# Patient Record
Sex: Male | Born: 1955 | Race: Black or African American | Hispanic: No | Marital: Single | Smoking: Current every day smoker
Health system: Southern US, Community
[De-identification: ages and names within clinical notes are randomized; demographics above are authoritative.]

## PROBLEM LIST (undated history)

## (undated) DIAGNOSIS — K219 Gastro-esophageal reflux disease without esophagitis: Secondary | ICD-10-CM

## (undated) DIAGNOSIS — L03119 Cellulitis of unspecified part of limb: Secondary | ICD-10-CM

## (undated) DIAGNOSIS — E119 Type 2 diabetes mellitus without complications: Secondary | ICD-10-CM

## (undated) DIAGNOSIS — G8929 Other chronic pain: Secondary | ICD-10-CM

## (undated) DIAGNOSIS — M1990 Unspecified osteoarthritis, unspecified site: Secondary | ICD-10-CM

## (undated) DIAGNOSIS — L02419 Cutaneous abscess of limb, unspecified: Secondary | ICD-10-CM

## (undated) DIAGNOSIS — E7800 Pure hypercholesterolemia, unspecified: Secondary | ICD-10-CM

## (undated) DIAGNOSIS — F329 Major depressive disorder, single episode, unspecified: Secondary | ICD-10-CM

## (undated) DIAGNOSIS — F32A Depression, unspecified: Secondary | ICD-10-CM

## (undated) DIAGNOSIS — D649 Anemia, unspecified: Secondary | ICD-10-CM

## (undated) DIAGNOSIS — D563 Thalassemia minor: Secondary | ICD-10-CM

## (undated) DIAGNOSIS — M25559 Pain in unspecified hip: Secondary | ICD-10-CM

## (undated) DIAGNOSIS — G4730 Sleep apnea, unspecified: Secondary | ICD-10-CM

## (undated) DIAGNOSIS — J189 Pneumonia, unspecified organism: Secondary | ICD-10-CM

## (undated) DIAGNOSIS — I10 Essential (primary) hypertension: Secondary | ICD-10-CM

## (undated) HISTORY — PX: INGUINAL HERNIA REPAIR: SUR1180

## (undated) HISTORY — PX: TOENAIL EXCISION: SUR558

## (undated) HISTORY — PX: SHOULDER ARTHROSCOPY W/ ROTATOR CUFF REPAIR: SHX2400

---

## 2015-02-03 ENCOUNTER — Encounter (HOSPITAL_COMMUNITY): Payer: Self-pay | Admitting: *Deleted

## 2015-02-03 ENCOUNTER — Emergency Department (HOSPITAL_COMMUNITY)
Admission: EM | Admit: 2015-02-03 | Discharge: 2015-02-03 | Disposition: A | Discharge status: Home / Self Care | Payer: Medicare Other | Attending: Emergency Medicine | Admitting: Emergency Medicine

## 2015-02-03 DIAGNOSIS — Z8719 Personal history of other diseases of the digestive system: Secondary | ICD-10-CM | POA: Insufficient documentation

## 2015-02-03 DIAGNOSIS — Z8659 Personal history of other mental and behavioral disorders: Secondary | ICD-10-CM | POA: Insufficient documentation

## 2015-02-03 DIAGNOSIS — E119 Type 2 diabetes mellitus without complications: Secondary | ICD-10-CM | POA: Insufficient documentation

## 2015-02-03 DIAGNOSIS — I10 Essential (primary) hypertension: Secondary | ICD-10-CM | POA: Insufficient documentation

## 2015-02-03 DIAGNOSIS — Z720 Tobacco use: Secondary | ICD-10-CM | POA: Insufficient documentation

## 2015-02-03 DIAGNOSIS — M25552 Pain in left hip: Secondary | ICD-10-CM | POA: Insufficient documentation

## 2015-02-03 DIAGNOSIS — G8929 Other chronic pain: Secondary | ICD-10-CM

## 2015-02-03 HISTORY — DX: Essential (primary) hypertension: I10

## 2015-02-03 HISTORY — DX: Gastro-esophageal reflux disease without esophagitis: K21.9

## 2015-02-03 HISTORY — DX: Depression, unspecified: F32.A

## 2015-02-03 HISTORY — DX: Pain in unspecified hip: M25.559

## 2015-02-03 HISTORY — DX: Major depressive disorder, single episode, unspecified: F32.9

## 2015-02-03 HISTORY — DX: Other chronic pain: G89.29

## 2015-02-03 MED ORDER — HYDROMORPHONE HCL 2 MG/ML IJ SOLN
2.0000 mg | Freq: Once | INTRAMUSCULAR | Status: AC
  Administered 2015-02-03: 2 mg via INTRAMUSCULAR
  Filled 2015-02-03: qty 1

## 2015-02-03 NOTE — ED Provider Notes (Signed)
CSN: [id number]     Arrival date & time 02/03/15  1641 History   First MD Initiated Contact with Patient 02/03/15 1646     Chief Complaint  Patient presents with  . Hip Pain     (Consider location/radiation/quality/duration/timing/severity/associated sxs/prior Treatment) HPI Comments: Chronic hip and leg pain has worsened in the past few days despite taking his normal pain meds. He was put on meloxicam by a new PCP, but that is not helping. Has percocet and vicodin at home.  Patient is a 59 y.o. male presenting with hip pain.  Hip Pain This is a chronic problem. The current episode started more than 1 week ago. The problem occurs constantly. The problem has not changed since onset.Pertinent negatives include no abdominal pain and no shortness of breath. Nothing aggravates the symptoms. Nothing relieves the symptoms.    Past Medical History  Diagnosis Date  . Diabetes mellitus without complication   . Hypertension   . Chronic hip pain   . GERD (gastroesophageal reflux disease)   . Depression    History reviewed. No pertinent past surgical history. History reviewed. No pertinent family history. History  Substance Use Topics  . Smoking status: Current Every Day Smoker  . Smokeless tobacco: Not on file  . Alcohol Use: Not on file    Review of Systems  Constitutional: Negative for fever.  Respiratory: Negative for cough and shortness of breath.   Gastrointestinal: Negative for nausea, vomiting and abdominal pain.  All other systems reviewed and are negative.     Allergies  Sulfa antibiotics  Home Medications   Prior to Admission medications   Not on File   BP 138/67 mmHg  Pulse 77  Temp(Src) 98.5 F (36.9 C) (Oral)  Resp 20  SpO2 100% Physical Exam  Constitutional: He is oriented to person, place, and time. He appears well-developed and well-nourished. No distress.  HENT:  Head: Normocephalic and atraumatic.  Mouth/Throat: No oropharyngeal exudate.  Eyes: EOM  are normal. Pupils are equal, round, and reactive to light.  Neck: Normal range of motion. Neck supple.  Cardiovascular: Normal rate and regular rhythm.  Exam reveals no friction rub.   No murmur Rissler. Pulmonary/Chest: Effort normal and breath sounds normal. No respiratory distress. He has no wheezes. He has no rales.  Abdominal: Soft. He exhibits no distension. There is no tenderness. There is no rebound.  Musculoskeletal: Normal range of motion. He exhibits no edema.  R calf wrapped in a [employer].  Neurological: He is alert and oriented to person, place, and time.  Skin: He is not diaphoretic.  Nursing note and vitals reviewed.   ED Course  Procedures (including critical care time) Labs Review Labs Reviewed - No data to display  Imaging Review No results found.   EKG Interpretation   Date/Time:  Friday Feb 03 2015 20:32:45 EDT Ventricular Rate:  67 PR Interval:  154 QRS Duration: 81 QT Interval:  413 QTC Calculation: 436 R Axis:   75 Text Interpretation:  Sinus rhythm Atrial premature complex Anteroseptal  infarct, old Baseline wander in lead(s) V2 V3 V4 No prior for comparison  Confirmed by Mingo Amber  MD, Eaton Folmar ([id number]) on 02/03/2015 8:38:10 PM      MDM   Final diagnoses:  Chronic hip pain, left    59 year old male here with hip pain. Chronic. Worse over the past 2 days. Also having right calf pain where he has an ulcer that he is following wound care with. Vitals are stable here. He has  had no recent trauma or falls. He denies any systemic symptoms. Patient given IM Dilaudid for pain control. I informed him I cannot send him home with any medications because he is starting on these and he needs to follow-up with a chronic pain clinic. Patient began having some mild L chest pain, described as light stinging after ambulation. No DOE. No crushing chest pain. EKG ok. Another dose of dilaudid given. Stable for discharge, states chest pain didn't last very long. No concern for  ACS.   Evelina Bucy, MD 02/03/15 [id number]

## 2015-02-03 NOTE — ED Notes (Signed)
Pt stood and ambulated.

## 2015-02-03 NOTE — ED Notes (Signed)
Pt now complaining of chest pain when RN entered room to discharge him. MD notified, MD Mingo Amber went in to see patient and EKG is being obtained.

## 2015-02-03 NOTE — ED Notes (Signed)
Pt attempting ambulation with tech

## 2015-02-03 NOTE — ED Notes (Signed)
Pt in via EMS c/o left hip pain, states this has been going on for years but recently has been uncontrollable, pt also c/o pain to chronic wound to right lower leg but states this is not new and denies new symptoms related to wound. Pain worse with ambulation, no distress noted.

## 2015-02-03 NOTE — Discharge Instructions (Signed)
Chronic Pain Chronic pain can be defined as pain that is off and on and lasts for 3-6 months or longer. Many things cause chronic pain, which can make it difficult to make a diagnosis. There are many treatment options available for chronic pain. However, finding a treatment that works well for you may require trying various approaches until the right one is found. Many people benefit from a combination of two or more types of treatment to control their pain. SYMPTOMS  Chronic pain can occur anywhere in the body and can range from mild to very severe. Some types of chronic pain include:  Headache.  Low back pain.  Cancer pain.  Arthritis pain.  Neurogenic pain. This is pain resulting from damage to nerves. People with chronic pain may also have other symptoms such as:  Depression.  Anger.  Insomnia.  Anxiety. DIAGNOSIS  Your health care provider will help diagnose your condition over time. In many cases, the initial focus will be on excluding possible conditions that could be causing the pain. Depending on your symptoms, your health care provider may order tests to diagnose your condition. Some of these tests may include:   Blood tests.   CT scan.   MRI.   X-rays.   Ultrasounds.   Nerve conduction studies.  You may need to see a specialist.  TREATMENT  Finding treatment that works well may take time. You may be referred to a pain specialist. He or she may prescribe medicine or therapies, such as:   Mindful meditation or yoga.  Shots (injections) of numbing or pain-relieving medicines into the spine or area of pain.  Local electrical stimulation.  Acupuncture.   Massage therapy.   Aroma, color, light, or sound therapy.   Biofeedback.   Working with a physical therapist to keep from getting stiff.   Regular, gentle exercise.   Cognitive or behavioral therapy.   Group support.  Sometimes, surgery may be recommended.  HOME CARE INSTRUCTIONS    Take all medicines as directed by your health care provider.   Lessen stress in your life by relaxing and doing things such as listening to calming music.   Exercise or be active as directed by your health care provider.   Eat a healthy diet and include things such as vegetables, fruits, fish, and lean meats in your diet.   Keep all follow-up appointments with your health care provider.   Attend a support group with others suffering from chronic pain. SEEK MEDICAL CARE IF:   Your pain gets worse.   You develop a new pain that was not there before.   You cannot tolerate medicines given to you by your health care provider.   You have new symptoms since your last visit with your health care provider.  SEEK IMMEDIATE MEDICAL CARE IF:   You feel weak.   You have decreased sensation or numbness.   You lose control of bowel or bladder function.   Your pain suddenly gets much worse.   You develop shaking.  You develop chills.  You develop confusion.  You develop chest pain.  You develop shortness of breath.  MAKE SURE YOU:  Understand these instructions.  Will watch your condition.  Will get help right away if you are not doing well or get worse. Document Released: 06/01/2002 Document Revised: 05/12/2013 Document Reviewed: 03/05/2013 [facility] Patient Information 2015 [place], [place]. This information is not intended to replace advice given to you by your health care provider. Make sure you discuss any  questions you have with your health care provider.   Emergency Department Resource Guide 1) Find a Doctor and Pay Out of Pocket Although you won't have to find out who is covered by your insurance plan, it is a good idea to ask around and get recommendations. You will then need to call the office and see if the doctor you have chosen will accept you as a new patient and what types of options they offer for patients who are self-pay. Some doctors offer  discounts or will set up payment plans for their patients who do not have insurance, but you will need to ask so you aren't surprised when you get to your appointment.  2) Contact Your Local Health Department Not all health departments have doctors that can see patients for sick visits, but many do, so it is worth a call to see if yours does. If you don't know where your local health department is, you can check in your phone book. The CDC also has a tool to help you locate your state's health department, and many state websites also have listings of all of their local health departments.  3) Find a [facility] If your illness is not likely to be very severe or complicated, you may want to try a walk in clinic. These are popping up all over the country in pharmacies, drugstores, and shopping centers. They're usually staffed by nurse practitioners or physician assistants that have been trained to treat common illnesses and complaints. They're usually fairly quick and inexpensive. However, if you have serious medical issues or chronic medical problems, these are probably not your best option.  No Primary Care Doctor: - Call Health Connect at  [phone] - they can help you locate a primary care doctor that  accepts your insurance, provides certain services, etc. - Physician Referral Service- [phone]  Chronic Pain Problems: Organization         Address  Phone   Notes  [facility]  [phone] Patients need to be referred by their primary care doctor.   Medication Assistance: Organization         Address  Phone   Notes  [facility] Medication [facility] [place], [place] [address] [phone] --Must be a resident of [facility] -- Must have NO insurance coverage whatsoever (no Medicaid/ Medicare, etc.) -- The pt. MUST have a primary care doctor that directs their care regularly and follows them in the community   MedAssist   [phone]   [employer]  4435218216    Agencies that provide inexpensive medical care: Organization         Address  Phone   Notes  [place]  [phone]   [facility]    [phone]   [facility] [place], [place] [address] [phone]   [place] [address] [place] [phone]   Planned Parenthood    [phone]   [facility]    [phone]   [place] and [place] [address], [place] Phone:  [phone], Fax:  [phone] Hours of Operation:  9 am - 6 pm, M-F.  Also accepts Medicaid/Medicare and self-pay.  [facility] for [place], [address], [place] Phone: [phone], Fax: [phone]. Hours of Operation:  8:30 am - 5:30 pm, M-F.  Also accepts Medicaid and self-pay.  [facility] [place] [address] [place] Phone: [phone]   [place], [place], [place] [phone], Ext. [phone] Mondays & Thursdays: 7-9 AM.  First 15 patients are seen on a first come, first serve basis.    [place] Providers:  Organization         Address  Phone   Notes  [facility] [address], [place] [phone] Also accepts self-pay patients.  [facility] [address], [place]  [phone]   [place], [address], [place] [phone]   [facility] [address] [place] [phone]   Lucianne Lei [address], [place]   [phone] Only accepts [place] Access [place] patients after they have their name applied to their card.   Self-Pay (no insurance) in [facility]:  Organization         Address  Phone   Notes  Sickle Cell Patients, [facility] Internal Medicine [place] [phone]   [facility] Urgent Care [place] [phone]   [facility] [place]  [place], [place], [place] [phone]   [facility]/Dr. Osei-Bonsu  [address], [place] or [address], [place] [phone] Phone number for both [place] and [place] locations is the same.  Urgent Medical and [facility] [address] [place] [phone]   [facility] [address] [place] or [address] [phone]   [facility] [address] [place] [phone], phone; [phone], fax Sees patients 1st and 3rd Saturday of every month.  Must not qualify for public or private insurance (i.e. Medicaid, Medicare, [place] Health Choice, Veterans' Benefits)  Household income should be no more than 200% of the poverty level The clinic cannot treat you if you are pregnant or think you are pregnant  Sexually transmitted diseases are not treated at the clinic.    Dental Care: Organization         Address  Phone  Notes  [facility] [place] [phone] Accepts children up to age 106 who are enrolled in [place] or [place]; pregnant women with a Medicaid card; and children who have applied for Medicaid or [place] Health Choice, but were declined, whose parents can pay a reduced fee at time of service.  [facility] Department of [facility]  [address], [place] [phone] Accepts children up to age 42 who are enrolled in [place] or [place]; pregnant women with a Medicaid card; and children who have applied for Medicaid or [place] Health Choice, but were declined, whose parents can pay a reduced fee at time of service.  [place] Adult Dental Access PROGRAM  [place]  [phone] Patients are seen by appointment only. Walk-ins are not accepted. [place] will see patients 16 years of age and older. Monday - Tuesday (8am-5pm) Most Wednesdays (8:30-5pm) $30 per visit, cash only  [facility] Adult Dental Access PROGRAM  [address], [place] [phone] Patients are seen by appointment only. Walk-ins are not accepted. [place]  will see patients 10 years of age and older. One Wednesday Evening (Monthly: Volunteer Based).  $30 per visit, cash only  [place]  [phone] for adults; Children under age 90, call Graduate Pediatric Dentistry at [phone]. Children aged 22-14, please call [phone] to request a pediatric application.  Dental services are provided in all areas of dental care including fillings, crowns and bridges, complete and partial dentures, implants, gum treatment, root canals, and extractions. Preventive care is also provided. Treatment is provided to both adults and children. Patients are selected via a lottery and there is often a waiting list.   [facility] [address] [place]  [phone] [email or web address]   [facility] [address] [place], [place] [phone], Ext. [phone] Second and Fourth Thursday of each month, opens at 6:30 AM; Clinic ends at 9 AM.  Patients are seen on a first-come first-served basis, and a limited number are seen during each clinic.   [facility]  [address] [place], [place] [phone]   Eligibility Requirements You must have lived in [place], [place], or [place] counties for at least the last three months.   You cannot be eligible for state or federal sponsored [employer], including [employer], [place], or [employer].   You generally cannot be eligible for healthcare insurance through your employer.    How to apply: Eligibility screenings are held every Tuesday and Wednesday  afternoon from 1:00 pm until 4:00 pm. You do not need an appointment for the interview!  [facility] [address] [place], [place]   [place]  Admire Department  [place]  [phone]    Behavioral Health Resources in the Community: Intensive Outpatient Programs Organization         Address  Phone  Notes  [place]. [address], [place], [place] [phone]   [facility] Outpatient [address] [place], [place]   ADS: Alcohol & Drug Svcs [address] [place], [place]   [place] [address],  [place], [place] or [phone]   Substance Abuse Resources Organization         Address  Phone  Notes  Alcohol and Drug Services  [phone]   [place]  [phone]   The [place]   Chinita Pester  [phone]   Residential & Outpatient Substance Abuse Program  [phone]   Psychological Services Organization         Address  Phone  Notes  [facility] [place]  [place]  [phone]   [place] [address], [place] or [phone]    Mobile Crisis Teams Organization         Address  Phone  Notes  [facility], Mobile Crisis Care Unit  [phone]   [facility]  [address] [place], [place]   [place] Levels [address] [place] [phone]    Self-Help/Support Groups Organization         Address  Phone             Notes  [place]. of [place] - variety of support groups  [place] Call for more information  Narcotics Anonymous (NA), Caring Services [address],  [place]  2 meetings at this location   Residential Treatment  Programs Organization         Address  Phone  Notes  [facility] [address]    [place]  [phone]   [facility]  [address], [place] [address], [place], [place]   [place], [place] [phone] Admissions: 8am-3pm M-F  Incentives Substance [place] [address],    [place], [place] [phone]   [facility] [address] [place], [place], [place]   [facility] [address],  [place], [place]   [facility]ight Programs - Intensive Outpatient [address], [place], [place]   [facility] ([place] [place].,  [place], [place] [phone] or [phone]   [facility] Services (RTS) [address], [place], [place] Accepts Medicaid  [place] [address]  [place] [phone] Substance Abuse/Addiction Treatment   [facility] Organization         Address  Phone  Notes  [facility]  [phone]   Domenic Schwab, PhD [address] Arlis [place], [place]   [phone] or [phone]   [place], [place] [phone]   [facility] [address] [place], [place] [phone] Insurance/Medicaid/sponsorship through [facility] and Families [address], [address] [place]                                    [place], [place] [phone] [place] [address], [place] [phone]    Dr. Adele Schilder  [phone]   [facility] Dept. 1) [address], [place] 2) [place] 3)  [place] [address], [place] [phone]  [phone]   [place] [phone] or [phone] (After Hours)

## 2015-02-03 NOTE — ED Notes (Signed)
Bed: EC95 Expected date:  Expected time:  Means of arrival:  Comments: EMS-wound

## 2015-02-03 NOTE — Progress Notes (Signed)
EDCM spoke to patient at bedside.  Patient confirms her has [employer].  Patient confirms his pcp is Dr. Rondell Reams with the [place] in [place].  System updated.  No further EDCM needs at this time.

## 2015-02-03 NOTE — ED Notes (Signed)
Md at bedside

## 2015-03-14 ENCOUNTER — Emergency Department (HOSPITAL_COMMUNITY): Payer: Medicare Other

## 2015-03-14 ENCOUNTER — Encounter (HOSPITAL_COMMUNITY): Payer: Self-pay

## 2015-03-14 ENCOUNTER — Inpatient Hospital Stay (HOSPITAL_COMMUNITY)
Admission: EM | Admit: 2015-03-14 | Discharge: 2015-03-24 | DRG: 872 | Disposition: A | Discharge status: Skilled Nursing Facility | Payer: Medicare Other | Attending: Internal Medicine | Admitting: Internal Medicine

## 2015-03-14 DIAGNOSIS — G8929 Other chronic pain: Secondary | ICD-10-CM | POA: Diagnosis present

## 2015-03-14 DIAGNOSIS — A4101 Sepsis due to Methicillin susceptible Staphylococcus aureus: Principal | ICD-10-CM | POA: Diagnosis present

## 2015-03-14 DIAGNOSIS — L03115 Cellulitis of right lower limb: Secondary | ICD-10-CM | POA: Diagnosis present

## 2015-03-14 DIAGNOSIS — M60009 Infective myositis, unspecified site: Secondary | ICD-10-CM | POA: Diagnosis present

## 2015-03-14 DIAGNOSIS — D72829 Elevated white blood cell count, unspecified: Secondary | ICD-10-CM | POA: Diagnosis present

## 2015-03-14 DIAGNOSIS — R509 Fever, unspecified: Secondary | ICD-10-CM

## 2015-03-14 DIAGNOSIS — Z23 Encounter for immunization: Secondary | ICD-10-CM

## 2015-03-14 DIAGNOSIS — E1351 Other specified diabetes mellitus with diabetic peripheral angiopathy without gangrene: Secondary | ICD-10-CM | POA: Diagnosis not present

## 2015-03-14 DIAGNOSIS — L03119 Cellulitis of unspecified part of limb: Secondary | ICD-10-CM | POA: Diagnosis not present

## 2015-03-14 DIAGNOSIS — E1165 Type 2 diabetes mellitus with hyperglycemia: Secondary | ICD-10-CM | POA: Diagnosis present

## 2015-03-14 DIAGNOSIS — L97529 Non-pressure chronic ulcer of other part of left foot with unspecified severity: Secondary | ICD-10-CM | POA: Diagnosis not present

## 2015-03-14 DIAGNOSIS — Z833 Family history of diabetes mellitus: Secondary | ICD-10-CM

## 2015-03-14 DIAGNOSIS — E1151 Type 2 diabetes mellitus with diabetic peripheral angiopathy without gangrene: Secondary | ICD-10-CM | POA: Diagnosis not present

## 2015-03-14 DIAGNOSIS — M25559 Pain in unspecified hip: Secondary | ICD-10-CM | POA: Diagnosis not present

## 2015-03-14 DIAGNOSIS — R609 Edema, unspecified: Secondary | ICD-10-CM | POA: Diagnosis not present

## 2015-03-14 DIAGNOSIS — E1140 Type 2 diabetes mellitus with diabetic neuropathy, unspecified: Secondary | ICD-10-CM | POA: Diagnosis present

## 2015-03-14 DIAGNOSIS — B9561 Methicillin susceptible Staphylococcus aureus infection as the cause of diseases classified elsewhere: Secondary | ICD-10-CM | POA: Diagnosis not present

## 2015-03-14 DIAGNOSIS — E119 Type 2 diabetes mellitus without complications: Secondary | ICD-10-CM | POA: Diagnosis present

## 2015-03-14 DIAGNOSIS — Z79899 Other long term (current) drug therapy: Secondary | ICD-10-CM | POA: Diagnosis not present

## 2015-03-14 DIAGNOSIS — A419 Sepsis, unspecified organism: Secondary | ICD-10-CM | POA: Diagnosis not present

## 2015-03-14 DIAGNOSIS — Z794 Long term (current) use of insulin: Secondary | ICD-10-CM

## 2015-03-14 DIAGNOSIS — M60062 Infective myositis, left lower leg: Secondary | ICD-10-CM | POA: Diagnosis not present

## 2015-03-14 DIAGNOSIS — Z882 Allergy status to sulfonamides status: Secondary | ICD-10-CM | POA: Diagnosis not present

## 2015-03-14 DIAGNOSIS — A4901 Methicillin susceptible Staphylococcus aureus infection, unspecified site: Secondary | ICD-10-CM | POA: Diagnosis present

## 2015-03-14 DIAGNOSIS — K219 Gastro-esophageal reflux disease without esophagitis: Secondary | ICD-10-CM | POA: Diagnosis not present

## 2015-03-14 DIAGNOSIS — E1010 Type 1 diabetes mellitus with ketoacidosis without coma: Secondary | ICD-10-CM | POA: Diagnosis present

## 2015-03-14 DIAGNOSIS — I10 Essential (primary) hypertension: Secondary | ICD-10-CM | POA: Diagnosis present

## 2015-03-14 DIAGNOSIS — L97329 Non-pressure chronic ulcer of left ankle with unspecified severity: Secondary | ICD-10-CM | POA: Diagnosis not present

## 2015-03-14 DIAGNOSIS — D509 Iron deficiency anemia, unspecified: Secondary | ICD-10-CM | POA: Diagnosis not present

## 2015-03-14 DIAGNOSIS — L0390 Cellulitis, unspecified: Secondary | ICD-10-CM

## 2015-03-14 DIAGNOSIS — L03116 Cellulitis of left lower limb: Secondary | ICD-10-CM

## 2015-03-14 DIAGNOSIS — E1365 Other specified diabetes mellitus with hyperglycemia: Secondary | ICD-10-CM

## 2015-03-14 DIAGNOSIS — E785 Hyperlipidemia, unspecified: Secondary | ICD-10-CM | POA: Diagnosis present

## 2015-03-14 DIAGNOSIS — K5900 Constipation, unspecified: Secondary | ICD-10-CM | POA: Diagnosis not present

## 2015-03-14 DIAGNOSIS — F17210 Nicotine dependence, cigarettes, uncomplicated: Secondary | ICD-10-CM | POA: Diagnosis present

## 2015-03-14 DIAGNOSIS — Z7982 Long term (current) use of aspirin: Secondary | ICD-10-CM | POA: Diagnosis not present

## 2015-03-14 DIAGNOSIS — L97319 Non-pressure chronic ulcer of right ankle with unspecified severity: Secondary | ICD-10-CM | POA: Diagnosis present

## 2015-03-14 DIAGNOSIS — I83003 Varicose veins of unspecified lower extremity with ulcer of ankle: Secondary | ICD-10-CM | POA: Diagnosis not present

## 2015-03-14 DIAGNOSIS — F329 Major depressive disorder, single episode, unspecified: Secondary | ICD-10-CM | POA: Diagnosis present

## 2015-03-14 DIAGNOSIS — D563 Thalassemia minor: Secondary | ICD-10-CM | POA: Diagnosis not present

## 2015-03-14 DIAGNOSIS — N400 Enlarged prostate without lower urinary tract symptoms: Secondary | ICD-10-CM | POA: Diagnosis not present

## 2015-03-14 DIAGNOSIS — B9689 Other specified bacterial agents as the cause of diseases classified elsewhere: Secondary | ICD-10-CM | POA: Diagnosis not present

## 2015-03-14 DIAGNOSIS — L03032 Cellulitis of left toe: Secondary | ICD-10-CM | POA: Diagnosis not present

## 2015-03-14 LAB — GLUCOSE, CAPILLARY: Glucose-Capillary: 216 mg/dL — ABNORMAL HIGH (ref 65–99)

## 2015-03-14 LAB — URINALYSIS, ROUTINE W REFLEX MICROSCOPIC
Glucose, UA: 250 mg/dL — AB
HGB URINE DIPSTICK: NEGATIVE
Ketones, ur: NEGATIVE mg/dL
Leukocytes, UA: NEGATIVE
NITRITE: NEGATIVE
Protein, ur: 30 mg/dL — AB
Specific Gravity, Urine: 1.033 — ABNORMAL HIGH (ref 1.005–1.030)
UROBILINOGEN UA: 1 mg/dL (ref 0.0–1.0)
pH: 5.5 (ref 5.0–8.0)

## 2015-03-14 LAB — CBC WITH DIFFERENTIAL/PLATELET
BASOS PCT: 0 % (ref 0–1)
Basophils Absolute: 0 10*3/uL (ref 0.0–0.1)
Eosinophils Absolute: 0.1 10*3/uL (ref 0.0–0.7)
Eosinophils Relative: 1 % (ref 0–5)
HEMATOCRIT: 26.3 % — AB (ref 39.0–52.0)
HEMOGLOBIN: 8.2 g/dL — AB (ref 13.0–17.0)
LYMPHS ABS: 1.1 10*3/uL (ref 0.7–4.0)
Lymphocytes Relative: 14 % (ref 12–46)
MCH: 20.8 pg — AB (ref 26.0–34.0)
MCHC: 31.2 g/dL (ref 30.0–36.0)
MCV: 66.8 fL — AB (ref 78.0–100.0)
MONO ABS: 0.4 10*3/uL (ref 0.1–1.0)
Monocytes Relative: 5 % (ref 3–12)
NEUTROS ABS: 6.5 10*3/uL (ref 1.7–7.7)
Neutrophils Relative %: 80 % — ABNORMAL HIGH (ref 43–77)
Platelets: 239 10*3/uL (ref 150–400)
RBC: 3.94 MIL/uL — AB (ref 4.22–5.81)
RDW: 17.6 % — ABNORMAL HIGH (ref 11.5–15.5)
WBC: 8.1 10*3/uL (ref 4.0–10.5)

## 2015-03-14 LAB — CG4 I-STAT (LACTIC ACID): Lactic Acid, Venous: 1.41 mmol/L (ref 0.5–2.0)

## 2015-03-14 LAB — COMPREHENSIVE METABOLIC PANEL
ALK PHOS: 125 U/L (ref 38–126)
ALT: 59 U/L (ref 17–63)
AST: 38 U/L (ref 15–41)
Albumin: 3.4 g/dL — ABNORMAL LOW (ref 3.5–5.0)
Anion gap: 9 (ref 5–15)
BUN: 22 mg/dL — ABNORMAL HIGH (ref 6–20)
CO2: 23 mmol/L (ref 22–32)
Calcium: 8.7 mg/dL — ABNORMAL LOW (ref 8.9–10.3)
Chloride: 108 mmol/L (ref 101–111)
Creatinine, Ser: 1.12 mg/dL (ref 0.61–1.24)
GFR calc Af Amer: >60 mL/min (ref >60)
Glucose, Bld: 189 mg/dL — ABNORMAL HIGH (ref 65–99)
POTASSIUM: 4.4 mmol/L (ref 3.5–5.1)
SODIUM: 140 mmol/L (ref 135–145)
Total Bilirubin: 0.4 mg/dL (ref 0.3–1.2)
Total Protein: 7.5 g/dL (ref 6.5–8.1)

## 2015-03-14 LAB — CBG MONITORING, ED
GLUCOSE-CAPILLARY: 202 mg/dL — AB (ref 65–99)
Glucose-Capillary: 192 mg/dL — ABNORMAL HIGH (ref 65–99)

## 2015-03-14 LAB — I-STAT CG4 LACTIC ACID, ED: Lactic Acid, Venous: 2.01 mmol/L (ref 0.5–2.0)

## 2015-03-14 LAB — URINE MICROSCOPIC-ADD ON

## 2015-03-14 MED ORDER — SODIUM CHLORIDE 0.9 % IV BOLUS (SEPSIS)
1000.0000 mL | Freq: Once | INTRAVENOUS | Status: AC
  Administered 2015-03-14: 1000 mL via INTRAVENOUS

## 2015-03-14 MED ORDER — MORPHINE SULFATE 4 MG/ML IJ SOLN
4.0000 mg | Freq: Once | INTRAMUSCULAR | Status: AC
  Administered 2015-03-14: 4 mg via INTRAVENOUS
  Filled 2015-03-14: qty 1

## 2015-03-14 MED ORDER — PIPERACILLIN-TAZOBACTAM 3.375 G IVPB 30 MIN
3.3750 g | Freq: Once | INTRAVENOUS | Status: AC
  Administered 2015-03-14: 3.375 g via INTRAVENOUS
  Filled 2015-03-14: qty 50

## 2015-03-14 MED ORDER — SODIUM CHLORIDE 0.9 % IV BOLUS (SEPSIS)
1000.0000 mL | INTRAVENOUS | Status: DC
  Administered 2015-03-14 (×2): 1000 mL via INTRAVENOUS

## 2015-03-14 MED ORDER — VANCOMYCIN HCL IN DEXTROSE 1-5 GM/200ML-% IV SOLN
1000.0000 mg | Freq: Once | INTRAVENOUS | Status: AC
  Administered 2015-03-14: 1000 mg via INTRAVENOUS
  Filled 2015-03-14: qty 200

## 2015-03-14 MED ORDER — KETOROLAC TROMETHAMINE 30 MG/ML IJ SOLN
30.0000 mg | Freq: Once | INTRAMUSCULAR | Status: AC
  Administered 2015-03-14: 30 mg via INTRAVENOUS
  Filled 2015-03-14: qty 1

## 2015-03-14 MED ORDER — SODIUM CHLORIDE 0.9 % IV SOLN: INTRAVENOUS | Status: DC

## 2015-03-14 MED ORDER — ACETAMINOPHEN 500 MG PO TABS: 1000.0000 mg | ORAL_TABLET | Freq: Once | ORAL | Status: DC

## 2015-03-14 NOTE — ED Notes (Signed)
MD in room talking with patient unable to collect labs at this time.

## 2015-03-14 NOTE — ED Notes (Signed)
[name], EDP given CG4 Lactic result.

## 2015-03-14 NOTE — ED Notes (Signed)
Patient transported to X-ray 

## 2015-03-14 NOTE — Progress Notes (Signed)
EDCM consulted to speak to patient regarding sometimes unable to obtain his medications.  EDCM spoke to patient at bedside.  Patient reports he lives at the [facility] in [place] a place for homeless veterans to help them return to society [place], per patient.  Patient reports he his able to receive all of his meals, shower and do laundry at the center.  Patient reports sometimes his medications are delivered to his wife's house and some of his medications go to the shelter in [facility].  Patient reports he has called the [facility] to try to rectify this problem.  [facility] reports [facility] for a visiting RN for wound care and an aide, but it hasn't happened yet.  Patient has his own rolator at bedside.  Patient noted to be wearing oxygen in the ED.  Patient does not have oxygen at home currently but reports he is supposed to wear it as needed.  Patient also reports he is supposed to wear a CPAP at night but he cannot tolerate it.  Patient requesting pain medication and something to drink.  Informed EDRN.  No further EDCM needs at this time.

## 2015-03-14 NOTE — ED Notes (Addendum)
Per EMS, Pt, from [facility], c/o fever x 2 days and BLE wounds and swelling x 1.5-2 weeks.  Pain score 10/10.  Pt reports that he is being treated for wounds.  Swelling noted to LLE.  1g acetaminophen given en route.

## 2015-03-14 NOTE — ED Provider Notes (Signed)
CSN: [id number]     Arrival date & time 03/14/15  1901 History   First MD Initiated Contact with Patient 03/14/15 1904     Chief Complaint  Patient presents with  . Fever  . Leg Wound      (Consider location/radiation/quality/duration/timing/severity/associated sxs/prior Treatment) The history is provided by the patient.  [name] is a 59 y.o. male hx of DM, HTN, reflux, chronic red wound here presenting with fever, worsening pain from leg ulcers. Patient has chronic leg ulcers. States that left leg has been more red and swollen but there is nothing draining from the ulcer. However over the last 2 weeks the right leg ulcer has purulent drainage and is more painful. The last 2 days he has been having subjective fevers and was 104F earlier today.     Past Medical History  Diagnosis Date  . Diabetes mellitus without complication   . Hypertension   . Chronic hip pain   . GERD (gastroesophageal reflux disease)   . Depression    History reviewed. No pertinent past surgical history. History reviewed. No pertinent family history. History  Substance Use Topics  . Smoking status: Current Every Day Smoker  . Smokeless tobacco: Not on file  . Alcohol Use: Not on file    Review of Systems  Constitutional: Positive for fever.  Skin: Positive for wound.  All other systems reviewed and are negative.     Allergies  Other and Sulfa antibiotics  Home Medications   Prior to Admission medications   Medication Sig Start Date End Date Taking? Authorizing Provider  aspirin EC 81 MG tablet Take 81 mg by mouth every morning.   Yes Historical Provider, MD  atorvastatin (LIPITOR) 80 MG tablet Take 80 mg by mouth daily.   Yes Historical Provider, MD  capsicum oleoresin (TRIXAICIN) 0.025 % cream Apply 1 application topically 2 (two) times daily.   Yes Historical Provider, MD  ferrous sulfate 325 (65 FE) MG tablet Take 325 mg by mouth 3 (three) times daily with meals.   Yes Historical Provider,  MD  gabapentin (NEURONTIN) 300 MG capsule Take 300 mg by mouth 3 (three) times daily.   Yes Historical Provider, MD  HYDROcodone-acetaminophen (NORCO/VICODIN) 5-325 MG per tablet Take 1 tablet by mouth every 8 (eight) hours as needed for moderate pain.   Yes Historical Provider, MD  hydrophilic ointment Apply 1 application topically 5 (five) times daily as needed for dry skin.   Yes Historical Provider, MD  ibuprofen (ADVIL,MOTRIN) 800 MG tablet Take 800 mg by mouth every 8 (eight) hours as needed for fever or moderate pain.   Yes Historical Provider, MD  insulin aspart protamine- aspart (NOVOLOG MIX 70/30) (70-30) 100 UNIT/ML injection Inject 40 Units into the skin 2 (two) times daily with a meal.   Yes Historical Provider, MD  meloxicam (MOBIC) 7.5 MG tablet Take 7.5 mg by mouth 2 (two) times daily.   Yes Historical Provider, MD  metFORMIN (GLUCOPHAGE) 1000 MG tablet Take 1,000 mg by mouth 2 (two) times daily with a meal.   Yes Historical Provider, MD  mirtazapine (REMERON) 15 MG tablet Take 15 mg by mouth at bedtime.   Yes Historical Provider, MD  omeprazole (PRILOSEC) 20 MG capsule Take 20 mg by mouth 2 (two) times daily before a meal.   Yes Historical Provider, MD  oxybutynin (DITROPAN-XL) 10 MG 24 hr tablet Take 10 mg by mouth at bedtime.   Yes Historical Provider, MD  tamsulosin (FLOMAX) 0.4 MG CAPS capsule  Take 0.4 mg by mouth daily after supper.   Yes Historical Provider, MD  Wound Dressings (PROMOGRAN EX) Apply 1 application topically every 3 (three) days. Apply dressing every three days   Yes Historical Provider, MD   BP 171/83 mmHg  Pulse 105  Temp(Src) 103 F (39.4 C) (Oral)  Resp 20  SpO2 97% Physical Exam  Constitutional: He is oriented to person, place, and time.  Chronically ill appearing, chills   HENT:  Head: Normocephalic.  MM dry   Eyes: Conjunctivae are normal. Pupils are equal, round, and reactive to light.  Neck: Normal range of motion. Neck supple.   Cardiovascular: Regular rhythm and normal heart sounds.   Tachycardic   Pulmonary/Chest: Effort normal and breath sounds normal.  Abdominal: Soft. Bowel sounds are normal. He exhibits no distension. There is no tenderness. There is no rebound.  Musculoskeletal:  R distal tibial ulcer with purulent drainage, stage 4. L heel ulcer stage 2. Surrounding cellulitis up the entire calf and calf is tender. Diminished pulses   Neurological: He is alert and oriented to person, place, and time. No cranial nerve deficit. Coordination normal.  Skin: Skin is warm. There is erythema.  Psychiatric: He has a normal mood and affect. His behavior is normal. Judgment and thought content normal.  Nursing note and vitals reviewed.   ED Course  Procedures (including critical care time) Labs Review Labs Reviewed  CBC WITH DIFFERENTIAL/PLATELET - Abnormal; Notable for the following:    RBC 3.94 (*)    Hemoglobin 8.2 (*)    HCT 26.3 (*)    MCV 66.8 (*)    MCH 20.8 (*)    RDW 17.6 (*)    Neutrophils Relative % 80 (*)    All other components within normal limits  COMPREHENSIVE METABOLIC PANEL - Abnormal; Notable for the following:    Glucose, Bld 189 (*)    BUN 22 (*)    Calcium 8.7 (*)    Albumin 3.4 (*)    All other components within normal limits  URINALYSIS, ROUTINE W REFLEX MICROSCOPIC (NOT AT [facility]) - Abnormal; Notable for the following:    Color, Urine AMBER (*)    Specific Gravity, Urine 1.033 (*)    Glucose, UA 250 (*)    Bilirubin Urine SMALL (*)    Protein, ur 30 (*)    All other components within normal limits  I-STAT CG4 LACTIC ACID, ED - Abnormal; Notable for the following:    Lactic Acid, Venous 2.01 (*)    All other components within normal limits  CBG MONITORING, ED - Abnormal; Notable for the following:    Glucose-Capillary 192 (*)    All other components within normal limits  CULTURE, BLOOD (ROUTINE X 2)  CULTURE, BLOOD (ROUTINE X 2)  URINE CULTURE  URINE MICROSCOPIC-ADD ON     Imaging Review Dg Chest 2 View  03/14/2015   CLINICAL DATA:  Patient with fever for 2 days. Bilateral lower extremity wounds.  EXAM: CHEST  2 VIEW  COMPARISON:  None.  FINDINGS: Monitoring leads overlie the patient. Low lung volumes. Cardiac and mediastinal contours upper limits of normal. Elevation right hemidiaphragm. Bibasilar heterogeneous opacities. No pleural effusion or pneumothorax. Lateral view nondiagnostic due to overlapping soft tissue.  IMPRESSION: Low lung volumes with bibasilar opacities favored to represent atelectasis.  Cardiac contours upper limits of normal.   Electronically Signed   By: Lovey Newcomer M.D.   On: 03/14/2015 21:07   Dg Tibia/fibula Right  03/14/2015   CLINICAL  DATA:  Bilateral lower extremity wounds, fever  EXAM: RIGHT TIBIA AND FIBULA - 2 VIEW  COMPARISON:  None.  FINDINGS: Soft tissue ulceration along the medial aspect of the distal tibia.  No radiographic findings to suggest osteomyelitis.  No fracture or dislocation is seen.  Degenerative changes of the knee.  Soft tissue swelling along the medial ankle.  IMPRESSION: Soft tissue ulceration along the medial aspect of the distal tibia. No radiographic findings to suggest osteomyelitis.   Electronically Signed   By: Julian Hy M.D.   On: 03/14/2015 21:04   Dg Foot Complete Left  03/14/2015   CLINICAL DATA:  .Bilateral lower extremity wounds with swelling. Symptoms for 2 weeks. LEFT foot ulcerations.  EXAM: LEFT FOOT - COMPLETE 3+ VIEW  COMPARISON:  None.  FINDINGS: Osteopenia. No plain film evidence of osteomyelitis. Radiopaque density is present over the medial ankle, likely representing wound dressing. No gas in the soft tissues of the foot. Diffuse forefoot soft tissue swelling. Anatomic alignment. Negative for fracture.  IMPRESSION: Diffuse swelling of the foot without plain film evidence of osteomyelitis.   Electronically Signed   By: Dereck Ligas M.D.   On: 03/14/2015 21:08     EKG  Interpretation None      MDM   Final diagnoses:  Fever    [name] is a 59 y.o. male here with fever, leg ulcer. Concerned for sepsis likely from cellulitis vs osteo. Will call code sepsis. Will get labs, xrays. Will admit and give abx.   9:41 PM WBC nl, lactate 2. Xray showed no osteo. Given vanc/zosyn. Will admit.     Wandra Arthurs, MD 03/14/15 2142

## 2015-03-14 NOTE — ED Notes (Signed)
Bed: WA17 Expected date:  Expected time:  Means of arrival:  Comments: EMS 59 yo male fever 102.2 /Tylenol 1 gram administered

## 2015-03-15 ENCOUNTER — Inpatient Hospital Stay (HOSPITAL_COMMUNITY): Payer: Medicare Other

## 2015-03-15 DIAGNOSIS — A4101 Sepsis due to Methicillin susceptible Staphylococcus aureus: Secondary | ICD-10-CM | POA: Diagnosis not present

## 2015-03-15 DIAGNOSIS — E1351 Other specified diabetes mellitus with diabetic peripheral angiopathy without gangrene: Secondary | ICD-10-CM | POA: Diagnosis present

## 2015-03-15 DIAGNOSIS — L03116 Cellulitis of left lower limb: Secondary | ICD-10-CM

## 2015-03-15 DIAGNOSIS — R609 Edema, unspecified: Secondary | ICD-10-CM

## 2015-03-15 DIAGNOSIS — D509 Iron deficiency anemia, unspecified: Secondary | ICD-10-CM

## 2015-03-15 DIAGNOSIS — L03115 Cellulitis of right lower limb: Secondary | ICD-10-CM

## 2015-03-15 DIAGNOSIS — E119 Type 2 diabetes mellitus without complications: Secondary | ICD-10-CM

## 2015-03-15 LAB — CBC WITH DIFFERENTIAL/PLATELET
BASOS ABS: 0 10*3/uL (ref 0.0–0.1)
Basophils Relative: 0 % (ref 0–1)
Eosinophils Absolute: 0.1 10*3/uL (ref 0.0–0.7)
Eosinophils Relative: 1 % (ref 0–5)
HCT: 29.3 % — ABNORMAL LOW (ref 39.0–52.0)
Hemoglobin: 9 g/dL — ABNORMAL LOW (ref 13.0–17.0)
LYMPHS ABS: 1.1 10*3/uL (ref 0.7–4.0)
Lymphocytes Relative: 13 % (ref 12–46)
MCH: 20.4 pg — AB (ref 26.0–34.0)
MCHC: 30.7 g/dL (ref 30.0–36.0)
MCV: 66.3 fL — ABNORMAL LOW (ref 78.0–100.0)
Monocytes Absolute: 0.7 10*3/uL (ref 0.1–1.0)
Monocytes Relative: 8 % (ref 3–12)
NEUTROS PCT: 78 % — AB (ref 43–77)
Neutro Abs: 6.8 10*3/uL (ref 1.7–7.7)
PLATELETS: 193 10*3/uL (ref 150–400)
RBC: 4.42 MIL/uL (ref 4.22–5.81)
RDW: 17.4 % — AB (ref 11.5–15.5)
WBC: 8.7 10*3/uL (ref 4.0–10.5)

## 2015-03-15 LAB — COMPREHENSIVE METABOLIC PANEL
ALBUMIN: 2.7 g/dL — AB (ref 3.5–5.0)
ALK PHOS: 131 U/L — AB (ref 38–126)
ALT: 52 U/L (ref 17–63)
AST: 37 U/L (ref 15–41)
Anion gap: 6 (ref 5–15)
BUN: 21 mg/dL — AB (ref 6–20)
CHLORIDE: 109 mmol/L (ref 101–111)
CO2: 24 mmol/L (ref 22–32)
Calcium: 7.7 mg/dL — ABNORMAL LOW (ref 8.9–10.3)
Creatinine, Ser: 1.23 mg/dL (ref 0.61–1.24)
GFR calc Af Amer: >60 mL/min (ref >60)
GFR calc non Af Amer: >60 mL/min (ref >60)
Glucose, Bld: 233 mg/dL — ABNORMAL HIGH (ref 65–99)
POTASSIUM: 4.3 mmol/L (ref 3.5–5.1)
SODIUM: 139 mmol/L (ref 135–145)
Total Bilirubin: 0.5 mg/dL (ref 0.3–1.2)
Total Protein: 6.5 g/dL (ref 6.5–8.1)

## 2015-03-15 LAB — PROTIME-INR
INR: 1.21 (ref 0.00–1.49)
PROTHROMBIN TIME: 15.5 s — AB (ref 11.6–15.2)

## 2015-03-15 LAB — IRON AND TIBC
Iron: 8 ug/dL — ABNORMAL LOW (ref 45–182)
SATURATION RATIOS: 3 % — AB (ref 17.9–39.5)
TIBC: 249 ug/dL — ABNORMAL LOW (ref 250–450)
UIBC: 241 ug/dL

## 2015-03-15 LAB — RETICULOCYTES
RBC.: 4.52 MIL/uL (ref 4.22–5.81)
RETIC CT PCT: 0.9 % (ref 0.4–3.1)
Retic Count, Absolute: 40.7 10*3/uL (ref 19.0–186.0)

## 2015-03-15 LAB — SEDIMENTATION RATE: SED RATE: 75 mm/h — AB (ref 0–16)

## 2015-03-15 LAB — ABO/RH: ABO/RH(D): O POS

## 2015-03-15 LAB — GLUCOSE, CAPILLARY
GLUCOSE-CAPILLARY: 153 mg/dL — AB (ref 65–99)
GLUCOSE-CAPILLARY: 131 mg/dL — AB (ref 65–99)
Glucose-Capillary: 160 mg/dL — ABNORMAL HIGH (ref 65–99)
Glucose-Capillary: 194 mg/dL — ABNORMAL HIGH (ref 65–99)

## 2015-03-15 LAB — CK: Total CK: 602 U/L — ABNORMAL HIGH (ref 49–397)

## 2015-03-15 LAB — VITAMIN B12: VITAMIN B 12: 169 pg/mL — AB (ref 180–914)

## 2015-03-15 LAB — TYPE AND SCREEN
ABO/RH(D): O POS
Antibody Screen: NEGATIVE

## 2015-03-15 LAB — FOLATE: Folate: 17.9 ng/mL (ref >5.9)

## 2015-03-15 LAB — MRSA PCR SCREENING: MRSA BY PCR: NEGATIVE

## 2015-03-15 LAB — APTT: aPTT: 33 seconds (ref 24–37)

## 2015-03-15 LAB — PROCALCITONIN: Procalcitonin: 2.65 ng/mL

## 2015-03-15 LAB — LACTIC ACID, PLASMA: LACTIC ACID, VENOUS: 1.1 mmol/L (ref 0.5–2.0)

## 2015-03-15 LAB — FERRITIN: Ferritin: 237 ng/mL (ref 24–336)

## 2015-03-15 MED ORDER — POLYVINYL ALCOHOL 1.4 % OP SOLN
1.0000 [drp] | OPHTHALMIC | Status: DC | PRN
  Administered 2015-03-15 – 2015-03-16 (×2): 1 [drp] via OPHTHALMIC
  Filled 2015-03-15: qty 15

## 2015-03-15 MED ORDER — INSULIN ASPART PROT & ASPART (70-30 MIX) 100 UNIT/ML ~~LOC~~ SUSP
40.0000 [IU] | Freq: Two times a day (BID) | SUBCUTANEOUS | Status: DC
  Filled 2015-03-15: qty 10

## 2015-03-15 MED ORDER — MORPHINE SULFATE 2 MG/ML IJ SOLN
2.0000 mg | INTRAMUSCULAR | Status: DC | PRN
  Administered 2015-03-15: 2 mg via INTRAVENOUS
  Filled 2015-03-15 (×2): qty 1

## 2015-03-15 MED ORDER — TAMSULOSIN HCL 0.4 MG PO CAPS
0.4000 mg | ORAL_CAPSULE | Freq: Every day | ORAL | Status: DC
  Administered 2015-03-15 – 2015-03-24 (×10): 0.4 mg via ORAL
  Filled 2015-03-15 (×10): qty 1

## 2015-03-15 MED ORDER — VANCOMYCIN HCL IN DEXTROSE 750-5 MG/150ML-% IV SOLN
750.0000 mg | Freq: Two times a day (BID) | INTRAVENOUS | Status: DC
  Administered 2015-03-15 – 2015-03-17 (×5): 750 mg via INTRAVENOUS
  Filled 2015-03-15 (×7): qty 150

## 2015-03-15 MED ORDER — GABAPENTIN 300 MG PO CAPS
300.0000 mg | ORAL_CAPSULE | Freq: Three times a day (TID) | ORAL | Status: DC
  Administered 2015-03-15 – 2015-03-24 (×30): 300 mg via ORAL
  Filled 2015-03-15 (×32): qty 1

## 2015-03-15 MED ORDER — OXYBUTYNIN CHLORIDE ER 10 MG PO TB24
10.0000 mg | ORAL_TABLET | Freq: Every day | ORAL | Status: DC
  Administered 2015-03-15 – 2015-03-23 (×10): 10 mg via ORAL
  Filled 2015-03-15: qty 2
  Filled 2015-03-15: qty 1
  Filled 2015-03-15: qty 2
  Filled 2015-03-15: qty 1
  Filled 2015-03-15 (×5): qty 2
  Filled 2015-03-15: qty 1
  Filled 2015-03-15: qty 2

## 2015-03-15 MED ORDER — HYDROMORPHONE HCL 1 MG/ML IJ SOLN
1.0000 mg | INTRAMUSCULAR | Status: DC | PRN
  Administered 2015-03-15 – 2015-03-17 (×16): 1 mg via INTRAVENOUS
  Filled 2015-03-15 (×16): qty 1

## 2015-03-15 MED ORDER — VANCOMYCIN HCL IN DEXTROSE 1-5 GM/200ML-% IV SOLN
1000.0000 mg | Freq: Once | INTRAVENOUS | Status: AC
  Administered 2015-03-15: 1000 mg via INTRAVENOUS
  Filled 2015-03-15: qty 200

## 2015-03-15 MED ORDER — ACETAMINOPHEN 325 MG PO TABS
650.0000 mg | ORAL_TABLET | ORAL | Status: AC
  Administered 2015-03-15: 650 mg via ORAL
  Filled 2015-03-15: qty 2

## 2015-03-15 MED ORDER — INSULIN ASPART PROT & ASPART (70-30 MIX) 100 UNIT/ML ~~LOC~~ SUSP
20.0000 [IU] | Freq: Two times a day (BID) | SUBCUTANEOUS | Status: DC
  Administered 2015-03-16 – 2015-03-23 (×15): 20 [IU] via SUBCUTANEOUS

## 2015-03-15 MED ORDER — TETANUS-DIPHTH-ACELL PERTUSSIS 5-2.5-18.5 LF-MCG/0.5 IM SUSP
0.5000 mL | Freq: Once | INTRAMUSCULAR | Status: AC
  Administered 2015-03-15: 0.5 mL via INTRAMUSCULAR
  Filled 2015-03-15: qty 0.5

## 2015-03-15 MED ORDER — SODIUM CHLORIDE 0.9 % IV SOLN
INTRAVENOUS | Status: AC
  Administered 2015-03-15 (×2): via INTRAVENOUS

## 2015-03-15 MED ORDER — ASPIRIN EC 81 MG PO TBEC
81.0000 mg | DELAYED_RELEASE_TABLET | ORAL | Status: DC
  Administered 2015-03-16 – 2015-03-24 (×9): 81 mg via ORAL
  Filled 2015-03-15 (×11): qty 1

## 2015-03-15 MED ORDER — MIRTAZAPINE 15 MG PO TABS
15.0000 mg | ORAL_TABLET | Freq: Every day | ORAL | Status: DC
  Administered 2015-03-15 – 2015-03-23 (×10): 15 mg via ORAL
  Filled 2015-03-15 (×12): qty 1

## 2015-03-15 MED ORDER — ATORVASTATIN CALCIUM 80 MG PO TABS
80.0000 mg | ORAL_TABLET | Freq: Every day | ORAL | Status: DC
  Administered 2015-03-15 – 2015-03-24 (×10): 80 mg via ORAL
  Filled 2015-03-15 (×5): qty 2
  Filled 2015-03-15: qty 1
  Filled 2015-03-15 (×2): qty 2
  Filled 2015-03-15 (×2): qty 1

## 2015-03-15 MED ORDER — ONDANSETRON HCL 4 MG/2ML IJ SOLN
4.0000 mg | Freq: Four times a day (QID) | INTRAMUSCULAR | Status: DC | PRN
Start: 2015-03-15 — End: 2015-03-24

## 2015-03-15 MED ORDER — FERROUS SULFATE 325 (65 FE) MG PO TABS
325.0000 mg | ORAL_TABLET | Freq: Three times a day (TID) | ORAL | Status: DC
  Administered 2015-03-15 – 2015-03-24 (×28): 325 mg via ORAL
  Filled 2015-03-15 (×30): qty 1

## 2015-03-15 MED ORDER — OXYBUTYNIN CHLORIDE ER 10 MG PO TB24
10.0000 mg | ORAL_TABLET | Freq: Every day | ORAL | Status: DC
  Filled 2015-03-15: qty 1

## 2015-03-15 MED ORDER — ACETAMINOPHEN 650 MG RE SUPP: 650.0000 mg | Freq: Four times a day (QID) | RECTAL | Status: DC | PRN

## 2015-03-15 MED ORDER — ONDANSETRON HCL 4 MG PO TABS: 4.0000 mg | ORAL_TABLET | Freq: Four times a day (QID) | ORAL | Status: DC | PRN

## 2015-03-15 MED ORDER — INSULIN ASPART 100 UNIT/ML ~~LOC~~ SOLN
0.0000 [IU] | Freq: Three times a day (TID) | SUBCUTANEOUS | Status: DC
  Administered 2015-03-15 (×2): 2 [IU] via SUBCUTANEOUS
  Administered 2015-03-16: 1 [IU] via SUBCUTANEOUS
  Administered 2015-03-16 (×2): 2 [IU] via SUBCUTANEOUS
  Administered 2015-03-17 (×2): 1 [IU] via SUBCUTANEOUS
  Administered 2015-03-18 – 2015-03-20 (×8): 2 [IU] via SUBCUTANEOUS
  Administered 2015-03-21: 3 [IU] via SUBCUTANEOUS
  Administered 2015-03-21 – 2015-03-22 (×4): 2 [IU] via SUBCUTANEOUS
  Administered 2015-03-22: 3 [IU] via SUBCUTANEOUS
  Administered 2015-03-23: 2 [IU] via SUBCUTANEOUS
  Administered 2015-03-24: 1 [IU] via SUBCUTANEOUS

## 2015-03-15 MED ORDER — ACETAMINOPHEN 325 MG PO TABS
650.0000 mg | ORAL_TABLET | Freq: Four times a day (QID) | ORAL | Status: DC | PRN
  Administered 2015-03-15 – 2015-03-18 (×10): 650 mg via ORAL
  Filled 2015-03-15 (×10): qty 2

## 2015-03-15 MED ORDER — PIPERACILLIN-TAZOBACTAM 3.375 G IVPB
3.3750 g | Freq: Three times a day (TID) | INTRAVENOUS | Status: DC
Start: 2015-03-15 — End: 2015-03-17
  Administered 2015-03-15 – 2015-03-17 (×8): 3.375 g via INTRAVENOUS
  Filled 2015-03-15 (×9): qty 50

## 2015-03-15 NOTE — Progress Notes (Addendum)
Pt admitted after midnight. Please refer to admission note done 03/15/2015. Please refer to admission note for detailso n A/P and HPI.  Pt seen and examined at the bedside.  59 y.o. male with history of diabetes mellitus type 2, chronic right lower extremity ulcer who presented to [facility] ED with fevers, chills and pain and swelling in B/L LE. He has extensive swelling in both legs. On admission, he was febrile with tachycardia, tachypnea. No leukocytosis. No evidence of osteomyelitis on x ray studies.  Assessment and Plan:  Bilateral lower extremity swelling / Sepsis due to Cellulitis - Sepsis criteria met with tachycardia, fever and source of infection cellulitis  - Started on vanco and zosyn - No evidence of osteomyelitis on imagine studies - MRI is pending - Ortho has seen the pt in consultation, do not think pt has compartment syndrome - WOC consulted, appreciate their assessment  Leisa Lenz [facility] [phone]

## 2015-03-15 NOTE — Progress Notes (Signed)
ANTIBIOTIC CONSULT NOTE - INITIAL  Pharmacy Consult for zosyn/vancomycin Indication: Sepsis  Allergies  Allergen Reactions  . Other     Pt is a Jehov[employer] Witness. No blood products.  . Sulfa Antibiotics     Causes flu-like symptom : sweating, chills, fever, body aches    Patient Measurements: Height: 6\' 1"  (185.4 cm) Weight: 236 lb (107.049 kg) IBW/kg (Calculated) : 79.9   Vital Signs: Temp: 100.5 F (38.1 C) (06/21 2252) Temp Source: Oral (06/21 2252) BP: 143/74 mmHg (06/21 2252) Pulse Rate: 115 (06/21 2252) Intake/Output from previous day: 06/21 0701 - 06/22 0700 In: 2000 [I.V.:1000; IV Piggyback:1000] Out: -  Intake/Output from this shift: Total I/O In: 2000 [I.V.:1000; IV Piggyback:1000] Out: -   Labs:  Recent Labs  03/14/15 1955  WBC 8.1  HGB 8.2*  PLT 239  CREATININE 1.12   Estimated Creatinine Clearance: 88.9 mL/min (by C-G formula based on Cr of 1.12). No results for input(s): VANCOTROUGH, VANCOPEAK, VANCORANDOM, GENTTROUGH, GENTPEAK, GENTRANDOM, TOBRATROUGH, TOBRAPEAK, TOBRARND, AMIKACINPEAK, AMIKACINTROU, AMIKACIN in the last 72 hours.   Microbiology: No results found for this or any previous visit (from the past 720 hour(s)).  Medical History: Past Medical History  Diagnosis Date  . Diabetes mellitus without complication   . Hypertension   . Chronic hip pain   . GERD (gastroesophageal reflux disease)   . Depression     Medications:  Prescriptions prior to admission  Medication Sig Dispense Refill Last Dose  . aspirin EC 81 MG tablet Take 81 mg by mouth every morning.   03/14/2015 at Unknown time  . atorvastatin (LIPITOR) 80 MG tablet Take 80 mg by mouth daily.   03/14/2015 at Unknown time  . capsicum oleoresin (TRIXAICIN) 0.025 % cream Apply 1 application topically 2 (two) times daily.   03/14/2015 at Unknown time  . ferrous sulfate 325 (65 FE) MG tablet Take 325 mg by mouth 3 (three) times daily with meals.   03/14/2015 at Unknown time  .  gabapentin (NEURONTIN) 300 MG capsule Take 300 mg by mouth 3 (three) times daily.   03/14/2015 at Unknown time  . HYDROcodone-acetaminophen (NORCO/VICODIN) 5-325 MG per tablet Take 1 tablet by mouth every 8 (eight) hours as needed for moderate pain.   03/14/2015 at Unknown time  . hydrophilic ointment Apply 1 application topically 5 (five) times daily as needed for dry skin.   03/14/2015 at Unknown time  . ibuprofen (ADVIL,MOTRIN) 800 MG tablet Take 800 mg by mouth every 8 (eight) hours as needed for fever or moderate pain.   Past Week at Unknown time  . insulin aspart protamine- aspart (NOVOLOG MIX 70/30) (70-30) 100 UNIT/ML injection Inject 40 Units into the skin 2 (two) times daily with a meal.   03/14/2015 at Unknown time  . meloxicam (MOBIC) 7.5 MG tablet Take 7.5 mg by mouth 2 (two) times daily.   03/14/2015 at Unknown time  . metFORMIN (GLUCOPHAGE) 1000 MG tablet Take 1,000 mg by mouth 2 (two) times daily with a meal.   03/14/2015 at Unknown time  . mirtazapine (REMERON) 15 MG tablet Take 15 mg by mouth at bedtime.   03/13/2015 at Unknown time  . omeprazole (PRILOSEC) 20 MG capsule Take 20 mg by mouth 2 (two) times daily before a meal.   03/14/2015 at Unknown time  . oxybutynin (DITROPAN-XL) 10 MG 24 hr tablet Take 10 mg by mouth at bedtime.   03/13/2015 at Unknown time  . tamsulosin (FLOMAX) 0.4 MG CAPS capsule Take 0.4 mg by  mouth daily after supper.   03/13/2015 at Unknown time  . Wound Dressings (PROMOGRAN EX) Apply 1 application topically every 3 (three) days. Apply dressing every three days   03/11/2015   Scheduled:  . aspirin EC  81 mg Oral [id number]  . atorvastatin  80 mg Oral Daily  . ferrous sulfate  325 mg Oral TID WC  . gabapentin  300 mg Oral TID  . insulin aspart  0-9 Units Subcutaneous TID WC  . insulin aspart protamine- aspart  40 Units Subcutaneous BID WC  . mirtazapine  15 mg Oral QHS  . oxybutynin  10 mg Oral QHS  . piperacillin-tazobactam (ZOSYN)  IV  3.375 g Intravenous Q8H  .  tamsulosin  0.4 mg Oral QPC supper  . vancomycin  1,000 mg Intravenous Once  . vancomycin  750 mg Intravenous Q12H   Infusions:  . sodium chloride     Assessment: 63 yoM admitted with fever and chronic leg wound.  Zosyn and Vancomycin per Rx for Sepsis.   Goal of Therapy:  Vancomycin trough level 15-20 mcg/ml  Plan:   Zosyn 3.375 Gm IV q8h EI  Vancomycin 1Gm x1 ED then additional 1Gm x1 = 2Gm load then 750mg  IV q12h  F/u SCr/cultures/levels as needed  Dorrene German 03/15/2015,1:08 AM

## 2015-03-15 NOTE — Progress Notes (Signed)
Utilization review completed.  

## 2015-03-15 NOTE — Consult Note (Signed)
Reason for Consult:Left leg infection Referring Physician: Dr. Maryan Char [name] is an 59 y.o. male.  HPI: Mr. [name] is a 59 yo male with a long history of a right lower leg ulcer intermittent in  nature since 1998. He says he has had multiple treatments on the right leg and the ulcer will heal and then recur. He has recently developed swelling and ulceration of the left ankle which led to him coming to the ER yesterday due to pain fever and chills. Admitted to the medical team and I was consulted for recommendations on management. He is diabetic but denies chronic numbness to his feet.  Past Medical History  Diagnosis Date  . Diabetes mellitus without complication   . Hypertension   . Chronic hip pain   . GERD (gastroesophageal reflux disease)   . Depression     Past Surgical History  Procedure Laterality Date  . Rotator cuff repair    . Hernia repair      Family History  Problem Relation Age of Onset  . Diabetes Mellitus II Mother   . Diabetes Mellitus II Father   . Diabetes Mellitus II Brother     Social History:  reports that he has been smoking.  He does not have any smokeless tobacco history on file. He reports that he drinks alcohol. His drug history is not on file.  Allergies:  Allergies  Allergen Reactions  . Other     Pt is a Jehov[employer] Witness. No blood products.  . Sulfa Antibiotics     Causes flu-like symptom : sweating, chills, fever, body aches    Medications: I have reviewed the patient's current medications.  Results for orders placed or performed during the hospital encounter of 03/14/15 (from the past 48 hour(s))  CBG monitoring, ED     Status: Abnormal   Collection Time: 03/14/15  7:13 PM  Result Value Ref Range   Glucose-Capillary 192 (H) 65 - 99 mg/dL  Urinalysis, Routine w reflex microscopic (not at [facility])     Status: Abnormal   Collection Time: 03/14/15  7:39 PM  Result Value Ref Range   Color, Urine AMBER (A) YELLOW    Comment: BIOCHEMICALS  MAY BE AFFECTED BY COLOR   APPearance CLEAR CLEAR   Specific Gravity, Urine 1.033 (H) 1.005 - 1.030   pH 5.5 5.0 - 8.0   Glucose, UA 250 (A) NEGATIVE mg/dL   Hgb urine dipstick NEGATIVE NEGATIVE   Bilirubin Urine SMALL (A) NEGATIVE   Ketones, ur NEGATIVE NEGATIVE mg/dL   Protein, ur 30 (A) NEGATIVE mg/dL   Urobilinogen, UA 1.0 0.0 - 1.0 mg/dL   Nitrite NEGATIVE NEGATIVE   Leukocytes, UA NEGATIVE NEGATIVE  Urine microscopic-add on     Status: None   Collection Time: 03/14/15  7:39 PM  Result Value Ref Range   RBC / HPF 0-2 <3 RBC/hpf  CBC with Differential     Status: Abnormal   Collection Time: 03/14/15  7:55 PM  Result Value Ref Range   WBC 8.1 4.0 - 10.5 K/uL   RBC 3.94 (L) 4.22 - 5.81 MIL/uL   Hemoglobin 8.2 (L) 13.0 - 17.0 g/dL   HCT 26.3 (L) 39.0 - 52.0 %   MCV 66.8 (L) 78.0 - 100.0 fL   MCH 20.8 (L) 26.0 - 34.0 pg   MCHC 31.2 30.0 - 36.0 g/dL   RDW 17.6 (H) 11.5 - 15.5 %   Platelets 239 150 - 400 K/uL   Neutrophils Relative % 80 (H)  43 - 77 %   Lymphocytes Relative 14 12 - 46 %   Monocytes Relative 5 3 - 12 %   Eosinophils Relative 1 0 - 5 %   Basophils Relative 0 0 - 1 %   Neutro Abs 6.5 1.7 - 7.7 K/uL   Lymphs Abs 1.1 0.7 - 4.0 K/uL   Monocytes Absolute 0.4 0.1 - 1.0 K/uL   Eosinophils Absolute 0.1 0.0 - 0.7 K/uL   Basophils Absolute 0.0 0.0 - 0.1 K/uL   Smear Review MORPHOLOGY UNREMARKABLE   Comprehensive metabolic panel     Status: Abnormal   Collection Time: 03/14/15  7:55 PM  Result Value Ref Range   Sodium 140 135 - 145 mmol/L   Potassium 4.4 3.5 - 5.1 mmol/L   Chloride 108 101 - 111 mmol/L   CO2 23 22 - 32 mmol/L   Glucose, Bld 189 (H) 65 - 99 mg/dL   BUN 22 (H) 6 - 20 mg/dL   Creatinine, Ser 1.12 0.61 - 1.24 mg/dL   Calcium 8.7 (L) 8.9 - 10.3 mg/dL   Total Protein 7.5 6.5 - 8.1 g/dL   Albumin 3.4 (L) 3.5 - 5.0 g/dL   AST 38 15 - 41 U/L   ALT 59 17 - 63 U/L   Alkaline Phosphatase 125 38 - 126 U/L   Total Bilirubin 0.4 0.3 - 1.2 mg/dL   GFR calc  non Af Amer >60 >60 mL/min   GFR calc Af Amer >60 >60 mL/min    Comment: (NOTE) The eGFR has been calculated using the CKD EPI equation. This calculation has not been validated in all clinical situations. eGFR's persistently <60 mL/min signify possible Chronic Kidney Disease.    Anion gap 9 5 - 15  I-Stat CG4 Lactic Acid, ED     Status: Abnormal   Collection Time: 03/14/15  8:10 PM  Result Value Ref Range   Lactic Acid, Venous 2.01 (HH) 0.5 - 2.0 mmol/L   Comment NOTIFIED PHYSICIAN   CBG monitoring, ED     Status: Abnormal   Collection Time: 03/14/15  9:46 PM  Result Value Ref Range   Glucose-Capillary 202 (H) 65 - 99 mg/dL  CG4 I-STAT (Lactic acid)     Status: None   Collection Time: 03/14/15 10:34 PM  Result Value Ref Range   Lactic Acid, Venous 1.41 0.5 - 2.0 mmol/L  Glucose, capillary     Status: Abnormal   Collection Time: 03/14/15 11:02 PM  Result Value Ref Range   Glucose-Capillary 216 (H) 65 - 99 mg/dL  Sedimentation rate     Status: Abnormal   Collection Time: 03/15/15  1:10 AM  Result Value Ref Range   Sed Rate 75 (H) 0 - 16 mm/hr  Comprehensive metabolic panel     Status: Abnormal   Collection Time: 03/15/15  1:10 AM  Result Value Ref Range   Sodium 139 135 - 145 mmol/L   Potassium 4.3 3.5 - 5.1 mmol/L   Chloride 109 101 - 111 mmol/L   CO2 24 22 - 32 mmol/L   Glucose, Bld 233 (H) 65 - 99 mg/dL   BUN 21 (H) 6 - 20 mg/dL   Creatinine, Ser 1.23 0.61 - 1.24 mg/dL   Calcium 7.7 (L) 8.9 - 10.3 mg/dL   Total Protein 6.5 6.5 - 8.1 g/dL   Albumin 2.7 (L) 3.5 - 5.0 g/dL   AST 37 15 - 41 U/L   ALT 52 17 - 63 U/L   Alkaline Phosphatase 131 (H)  38 - 126 U/L   Total Bilirubin 0.5 0.3 - 1.2 mg/dL   GFR calc non Af Amer >60 >60 mL/min   GFR calc Af Amer >60 >60 mL/min    Comment: (NOTE) The eGFR has been calculated using the CKD EPI equation. This calculation has not been validated in all clinical situations. eGFR's persistently <60 mL/min signify possible Chronic  Kidney Disease.    Anion gap 6 5 - 15  CBC with Differential/Platelet     Status: Abnormal   Collection Time: 03/15/15  1:10 AM  Result Value Ref Range   WBC 8.7 4.0 - 10.5 K/uL   RBC 4.42 4.22 - 5.81 MIL/uL   Hemoglobin 9.0 (L) 13.0 - 17.0 g/dL   HCT 29.3 (L) 39.0 - 52.0 %   MCV 66.3 (L) 78.0 - 100.0 fL   MCH 20.4 (L) 26.0 - 34.0 pg   MCHC 30.7 30.0 - 36.0 g/dL   RDW 17.4 (H) 11.5 - 15.5 %   Platelets 193 150 - 400 K/uL   Neutrophils Relative % 78 (H) 43 - 77 %   Lymphocytes Relative 13 12 - 46 %   Monocytes Relative 8 3 - 12 %   Eosinophils Relative 1 0 - 5 %   Basophils Relative 0 0 - 1 %   Neutro Abs 6.8 1.7 - 7.7 K/uL   Lymphs Abs 1.1 0.7 - 4.0 K/uL   Monocytes Absolute 0.7 0.1 - 1.0 K/uL   Eosinophils Absolute 0.1 0.0 - 0.7 K/uL   Basophils Absolute 0.0 0.0 - 0.1 K/uL   RBC Morphology TARGET CELLS    WBC Morphology MILD LEFT SHIFT (1-5% METAS, OCC MYELO, OCC BANDS)   Lactic acid, plasma     Status: None   Collection Time: 03/15/15  1:10 AM  Result Value Ref Range   Lactic Acid, Venous 1.1 0.5 - 2.0 mmol/L  Procalcitonin     Status: None   Collection Time: 03/15/15  1:10 AM  Result Value Ref Range   Procalcitonin 2.65 ng/mL    Comment:        Interpretation: PCT > 2 ng/mL: Systemic infection (sepsis) is likely, unless other causes are known. (NOTE)         ICU PCT Algorithm               Non ICU PCT Algorithm    ----------------------------     ------------------------------         PCT < 0.25 ng/mL                 PCT < 0.1 ng/mL     Stopping of antibiotics            Stopping of antibiotics       strongly encouraged.               strongly encouraged.    ----------------------------     ------------------------------       PCT level decrease by               PCT < 0.25 ng/mL       >= 80% from peak PCT       OR PCT 0.25 - 0.5 ng/mL          Stopping of antibiotics  encouraged.     Stopping of antibiotics            encouraged.    ----------------------------     ------------------------------       PCT level decrease by              PCT >= 0.25 ng/mL       < 80% from peak PCT        AND PCT >= 0.5 ng/mL            Continuing antibiotics                                               encouraged.       Continuing antibiotics            encouraged.    ----------------------------     ------------------------------     PCT level increase compared          PCT > 0.5 ng/mL         with peak PCT AND          PCT >= 0.5 ng/mL             Escalation of antibiotics                                          strongly encouraged.      Escalation of antibiotics        strongly encouraged.   Protime-INR     Status: Abnormal   Collection Time: 03/15/15  1:10 AM  Result Value Ref Range   Prothrombin Time 15.5 (H) 11.6 - 15.2 seconds   INR 1.21 0.00 - 1.49  APTT     Status: None   Collection Time: 03/15/15  1:10 AM  Result Value Ref Range   aPTT 33 24 - 37 seconds  Type and screen     Status: None   Collection Time: 03/15/15  1:10 AM  Result Value Ref Range   ABO/RH(D) O POS    Antibody Screen NEG    Sample Expiration 03/18/2015   CK     Status: Abnormal   Collection Time: 03/15/15  1:10 AM  Result Value Ref Range   Total CK 602 (H) 49 - 397 U/L    Dg Chest 2 View  03/14/2015   CLINICAL DATA:  Patient with fever for 2 days. Bilateral lower extremity wounds.  EXAM: CHEST  2 VIEW  COMPARISON:  None.  FINDINGS: Monitoring leads overlie the patient. Low lung volumes. Cardiac and mediastinal contours upper limits of normal. Elevation right hemidiaphragm. Bibasilar heterogeneous opacities. No pleural effusion or pneumothorax. Lateral view nondiagnostic due to overlapping soft tissue.  IMPRESSION: Low lung volumes with bibasilar opacities favored to represent atelectasis.  Cardiac contours upper limits of normal.   Electronically Signed   By: Lovey Newcomer M.D.   On: 03/14/2015 21:07   Dg Tibia/fibula  Right  03/14/2015   CLINICAL DATA:  Bilateral lower extremity wounds, fever  EXAM: RIGHT TIBIA AND FIBULA - 2 VIEW  COMPARISON:  None.  FINDINGS: Soft tissue ulceration along the medial aspect of the distal tibia.  No radiographic findings to suggest osteomyelitis.  No fracture or dislocation is seen.  Degenerative changes of the knee.  Soft tissue swelling along the medial ankle.  IMPRESSION: Soft tissue ulceration along the medial aspect of the distal tibia. No radiographic findings to suggest osteomyelitis.   Electronically Signed   By: Julian Hy M.D.   On: 03/14/2015 21:04   Dg Foot Complete Left  03/14/2015   CLINICAL DATA:  .Bilateral lower extremity wounds with swelling. Symptoms for 2 weeks. LEFT foot ulcerations.  EXAM: LEFT FOOT - COMPLETE 3+ VIEW  COMPARISON:  None.  FINDINGS: Osteopenia. No plain film evidence of osteomyelitis. Radiopaque density is present over the medial ankle, likely representing wound dressing. No gas in the soft tissues of the foot. Diffuse forefoot soft tissue swelling. Anatomic alignment. Negative for fracture.  IMPRESSION: Diffuse swelling of the foot without plain film evidence of osteomyelitis.   Electronically Signed   By: Dereck Ligas M.D.   On: 03/14/2015 21:08    ROS Blood pressure 139/97, pulse 107, temperature 102.7 F (39.3 C), temperature source Oral, resp. rate 22, height _0  (1.854 m), weight 107.049 kg (236 lb), SpO2 97 %. Physical Exam Physical Examination: General appearance - alert, well appearing, and in no distress Mental status - alert, oriented to person, place, and time Neurological - alert, oriented, normal speech, no focal findings or movement disorder noted Right lower extremity- Moderate edema; no significant warmth; 2 x 4 cm ulceration medial lower leg; no purulent drainage;no soft tissue crepitance Left lower extremity- significant swelling; compartments soft; some swelling to mid-thigh; no soft tissue crepitance; 2 x 2 cm  superficial ulceration medial ankle; mild serous drainage without purulence  Assessment/Plan: Bilateral lower extremity ulceration- No signs of compartment syndrome LLE. Has significant cellulitis with associated edema. Both lower extremities are edematous but worse on left. No signs of necrotizing fasciitis. Agree with MRI LLE to rule out abscess. No surgery unless there is an abscess. Recommend elevation; IV antibiotics. If there is no surgical indication then would recommend wound care consult as this is and will be a chronic issue. Would do normal saline wet to dry dressing changes on the ulcers for now.  Gearlean Alf 03/15/2015, 6:51 AM

## 2015-03-15 NOTE — Consult Note (Signed)
WOC wound consult note Reason for Consult: LE wounds.  Patient with 18 + year history of LE wounds.  He has been followed by multiple different providers, including the [place].  It sounds like based on the patient's report that he is currently followed by wound care MD at the [facility] and has requested HHRN to change bilateral LE wounds and compression therapy 1x wk.  He has been going in to the wound care center 1x wk if he can get there. He is very knowledgeable on different wound care products and when I discuss the care with him.  The LE ulcers are NOT PRESSURE ULCERS Wound type: Chronic venous stasis ulcer bilateral LE; Right medial malleolus; medial and lateral left malleolus Pressure Ulcer POA: NO Measurement: R medial: 7cm x 4cm x 0.5cm  L medial: 6cm x 4cm x 0.2cm  L lateral: 14cm x 4cm x 0.2cm  Wound bed: Right medial deeper with moderate exudate, some mild odor, fibrin 20%, 80% pink Left medial and lateral full thickness, yellow fibrin, some maceration, minimal exudate Drainage (amount, consistency, odor) see above Periwound: venous dermatitis Dressing procedure/placement/frequency: Calcium alginate for exudate management, suggested silver hydrofiber due to chronicity however the patient does not like the burning sensation of this dressing.  Top the right LE wound with soft silicone foam to accommodate excess exudate. Will have orthopedic tech replace Unna's boot to the RLE, he manages his edema with use of Unna's.  However the LLE is currently quite inflamed and indurated, I will not compress at this time for that reason, topical care only ordered.   Will need to resume HHRN if possible for weekly care, and [facility]. Would not resume Unna's boot on the LLE until the cellulitis has resolved.  Discussed POC with patient and bedside nurse.  Re consult if needed, will not follow at this time. Thanks  Ayame Rena Kellogg, [place] [phone])

## 2015-03-15 NOTE — Progress Notes (Signed)
VASCULAR LAB PRELIMINARY  PRELIMINARY  PRELIMINARY  PRELIMINARY  Bilateral lower extremity venous duplex completed.    Preliminary report:  Bilateral:  No evidence of DVT, superficial thrombosis, or Baker's Cyst. Moderate interstitial fluid noted bilaterally. Enlargement of the left inguinal lymph nodes   Christipher Rieger, RVS 03/15/2015, 12:01 PM

## 2015-03-15 NOTE — H&P (Addendum)
[facility] History and Physical  [name] ZDG:[id number] DOB: 11/14/1955 DOA: 03/14/2015  Referring physician: Dr.Yao. PCP: PROVIDER NOT IN SYSTEM Follows at [place]. Specialists: None.  Chief Complaint: Fever chills.  HPI: [name] is a 59 y.o. male with history of diabetes mellitus type 2 chronic right lower extremity ulcer on the leg, degenerative hip presents to the ER because of fever and chills. Patient states he has been having fever chills over the last 2 days. Patient also over the last 1 week notice increasing swelling in his left lower extremity which is extended proximally. Patient also developed some blisters on his foot on the left side which had denuded to form ulcers. Patient's leg on exam is swollen up to his thigh and tense. Patient has severe pain on trying to move. Patient has sensation and denies any tingling or numbness. Patient's leg is warm to touch up to the foot. Patient also has a chronic ulcer on his right shin. There is no active discharge from this area. X-rays do not reveal any active bony involvement. Patient was febrile and tachycardic and was given fluid bolus. Patient is admitted for sepsis from cellulitis of the lower extremity with concern for compartment syndrome.   Review of Systems: As presented in the history of presenting illness, rest negative.  Past Medical History  Diagnosis Date  . Diabetes mellitus without complication   . Hypertension   . Chronic hip pain   . GERD (gastroesophageal reflux disease)   . Depression    Past Surgical History  Procedure Laterality Date  . Rotator cuff repair    . Hernia repair     Social History:  reports that he has been smoking.  He does not have any smokeless tobacco history on file. He reports that he drinks alcohol. His drug history is not on file. Where does patient live lives in shelter. Can patient participate in ADLs? Not sure.  Allergies  Allergen Reactions  . Other     Pt is a Jehovah  Witness. No blood products.  . Sulfa Antibiotics     Causes flu-like symptom : sweating, chills, fever, body aches    Family History:  Family History  Problem Relation Age of Onset  . Diabetes Mellitus II Mother   . Diabetes Mellitus II Father   . Diabetes Mellitus II Brother       Prior to Admission medications   Medication Sig Start Date End Date Taking? Authorizing Provider  aspirin EC 81 MG tablet Take 81 mg by mouth every morning.   Yes Historical Provider, MD  atorvastatin (LIPITOR) 80 MG tablet Take 80 mg by mouth daily.   Yes Historical Provider, MD  capsicum oleoresin (TRIXAICIN) 0.025 % cream Apply 1 application topically 2 (two) times daily.   Yes Historical Provider, MD  ferrous sulfate 325 (65 FE) MG tablet Take 325 mg by mouth 3 (three) times daily with meals.   Yes Historical Provider, MD  gabapentin (NEURONTIN) 300 MG capsule Take 300 mg by mouth 3 (three) times daily.   Yes Historical Provider, MD  HYDROcodone-acetaminophen (NORCO/VICODIN) 5-325 MG per tablet Take 1 tablet by mouth every 8 (eight) hours as needed for moderate pain.   Yes Historical Provider, MD  hydrophilic ointment Apply 1 application topically 5 (five) times daily as needed for dry skin.   Yes Historical Provider, MD  ibuprofen (ADVIL,MOTRIN) 800 MG tablet Take 800 mg by mouth every 8 (eight) hours as needed for fever or moderate pain.   Yes  Historical Provider, MD  insulin aspart protamine- aspart (NOVOLOG MIX 70/30) (70-30) 100 UNIT/ML injection Inject 40 Units into the skin 2 (two) times daily with a meal.   Yes Historical Provider, MD  meloxicam (MOBIC) 7.5 MG tablet Take 7.5 mg by mouth 2 (two) times daily.   Yes Historical Provider, MD  metFORMIN (GLUCOPHAGE) 1000 MG tablet Take 1,000 mg by mouth 2 (two) times daily with a meal.   Yes Historical Provider, MD  mirtazapine (REMERON) 15 MG tablet Take 15 mg by mouth at bedtime.   Yes Historical Provider, MD  omeprazole (PRILOSEC) 20 MG capsule Take 20  mg by mouth 2 (two) times daily before a meal.   Yes Historical Provider, MD  oxybutynin (DITROPAN-XL) 10 MG 24 hr tablet Take 10 mg by mouth at bedtime.   Yes Historical Provider, MD  tamsulosin (FLOMAX) 0.4 MG CAPS capsule Take 0.4 mg by mouth daily after supper.   Yes Historical Provider, MD  Wound Dressings (PROMOGRAN EX) Apply 1 application topically every 3 (three) days. Apply dressing every three days   Yes Historical Provider, MD    Physical Exam: Filed Vitals:   03/14/15 2145 03/14/15 2200 03/14/15 2215 03/14/15 2252  BP: 150/72 158/60 141/62 143/74  Pulse: 114 117 120 115  Temp:    100.5 F (38.1 C)  TempSrc:    Oral  Resp: 31 26 21 24   SpO2: 99% 95% 99% 98%     General:  Moderately built and nourished.  Eyes: Anicteric no pallor.  ENT: No discharge from ears eyes nose and mouth.  Neck: No mass felt.  Cardiovascular: S1 and S2 Jun.  Respiratory: No rhonchi or crepitations.  Abdomen: Soft nontender bowel sounds present.  Skin: There is erythema extending up to his left thigh from the foot on the left lower extremity and mild erythema on the right shin area with ulcer on the right shin on the medial aspect measuring around 5 x 3 cm with no active discharge. There is an ulcer on the medial aspect of his left foot and a small blister on the lateral aspect of his left foot.  Musculoskeletal: Swelling of the left lower extremity extending up to his thigh which is tense.  Psychiatric: Appears normal.  Neurologic: Alert awake oriented to time place and person. Moves all extremities.  Labs on Admission:  Basic Metabolic Panel:  Recent Labs Lab 03/14/15 1955  NA 140  K 4.4  CL 108  CO2 23  GLUCOSE 189*  BUN 22*  CREATININE 1.12  CALCIUM 8.7*   Liver Function Tests:  Recent Labs Lab 03/14/15 1955  AST 38  ALT 59  ALKPHOS 125  BILITOT 0.4  PROT 7.5  ALBUMIN 3.4*   No results for input(s): LIPASE, AMYLASE in the last 168 hours. No results for  input(s): AMMONIA in the last 168 hours. CBC:  Recent Labs Lab 03/14/15 1955  WBC 8.1  NEUTROABS 6.5  HGB 8.2*  HCT 26.3*  MCV 66.8*  PLT 239   Cardiac Enzymes: No results for input(s): CKTOTAL, CKMB, CKMBINDEX, TROPONINI in the last 168 hours.  BNP (last 3 results) No results for input(s): BNP in the last 8760 hours.  ProBNP (last 3 results) No results for input(s): PROBNP in the last 8760 hours.  CBG:  Recent Labs Lab 03/14/15 1913 03/14/15 2146 03/14/15 2302  GLUCAP 192* 202* 216*    Radiological Exams on Admission: Dg Chest 2 View  03/14/2015   CLINICAL DATA:  Patient with fever for  2 days. Bilateral lower extremity wounds.  EXAM: CHEST  2 VIEW  COMPARISON:  None.  FINDINGS: Monitoring leads overlie the patient. Low lung volumes. Cardiac and mediastinal contours upper limits of normal. Elevation right hemidiaphragm. Bibasilar heterogeneous opacities. No pleural effusion or pneumothorax. Lateral view nondiagnostic due to overlapping soft tissue.  IMPRESSION: Low lung volumes with bibasilar opacities favored to represent atelectasis.  Cardiac contours upper limits of normal.   Electronically Signed   By: Lovey Newcomer M.D.   On: 03/14/2015 21:07   Dg Tibia/fibula Right  03/14/2015   CLINICAL DATA:  Bilateral lower extremity wounds, fever  EXAM: RIGHT TIBIA AND FIBULA - 2 VIEW  COMPARISON:  None.  FINDINGS: Soft tissue ulceration along the medial aspect of the distal tibia.  No radiographic findings to suggest osteomyelitis.  No fracture or dislocation is seen.  Degenerative changes of the knee.  Soft tissue swelling along the medial ankle.  IMPRESSION: Soft tissue ulceration along the medial aspect of the distal tibia. No radiographic findings to suggest osteomyelitis.   Electronically Signed   By: Julian Hy M.D.   On: 03/14/2015 21:04   Dg Foot Complete Left  03/14/2015   CLINICAL DATA:  .Bilateral lower extremity wounds with swelling. Symptoms for 2 weeks. LEFT foot  ulcerations.  EXAM: LEFT FOOT - COMPLETE 3+ VIEW  COMPARISON:  None.  FINDINGS: Osteopenia. No plain film evidence of osteomyelitis. Radiopaque density is present over the medial ankle, likely representing wound dressing. No gas in the soft tissues of the foot. Diffuse forefoot soft tissue swelling. Anatomic alignment. Negative for fracture.  IMPRESSION: Diffuse swelling of the foot without plain film evidence of osteomyelitis.   Electronically Signed   By: Dereck Ligas M.D.   On: 03/14/2015 21:08     Assessment/Plan Principal Problem:   Sepsis Active Problems:   Cellulitis of both lower extremities   Diabetes mellitus type 2, controlled   Microcytic anemia   1. Sepsis secondary to cellulitis of the lower extremity with diabetic foot ulcer concerning for compartment syndrome - blood cultures have been obtained and procalcitonin levels have been ordered and patient is placed on sepsis protocol. Patient is empirically started on vancomycin and Zosyn. I have ordered MRI of the left foot and left leg. Check CK levels and continue with aggressive hydration and I have consulted the on-call orthopedic surgeon Dr. Elmyra Ricks who will be seeing patient in consult. Continue with preventative medications. Will keep patient nothing by mouth in a.m. in anticipation of possible procedure if needed. Check Dopplers of the lower extremity to rule out DVT. Patient's legs are warm and I don't think that there is an acute ischemic event. 2. Diabetes mellitus type 2 presently appears controlled - continue home medications with close follow-up of sliding-scale coverage. 3. 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 Patient states he also was told to take iron pills and has had a colonoscopy 5 years ago which was unremarkable and is due for another one shortly. Patient states he occasionally sees blood in the stools. At this time I have ordered a repeat CBC type and screen and  check anemia panel. Follow CBC closely. 4. History of BPH - continue present medications.   DVT Prophylaxis foot pump in anticipation of possible procedure.  Code Status: Full code.  Family Communication: Discussed with patient.  Disposition Plan: Admit to inpatient.    Sayana Salley N. [facility] Pager [phone].  If 7PM-7AM, please contact night-coverage [email or web address] Password [email or web address]  03/15/2015, 12:12 AM

## 2015-03-15 NOTE — Progress Notes (Signed)
Spoke to [place], MD about pt condition declining...orders to transfer to step down.

## 2015-03-15 NOTE — Progress Notes (Signed)
Pt transported to step down.

## 2015-03-15 NOTE — Progress Notes (Signed)
Pt spiking fever, septic. Will transfer to SDU for further monitoring. Continue current abx, IV fluids.  Leisa Lenz [facility] [phone]

## 2015-03-16 ENCOUNTER — Inpatient Hospital Stay (HOSPITAL_COMMUNITY): Payer: Medicare Other

## 2015-03-16 DIAGNOSIS — E785 Hyperlipidemia, unspecified: Secondary | ICD-10-CM

## 2015-03-16 LAB — BASIC METABOLIC PANEL
ANION GAP: 8 (ref 5–15)
BUN: 18 mg/dL (ref 6–20)
CO2: 23 mmol/L (ref 22–32)
Calcium: 7.9 mg/dL — ABNORMAL LOW (ref 8.9–10.3)
Chloride: 107 mmol/L (ref 101–111)
Creatinine, Ser: 1.02 mg/dL (ref 0.61–1.24)
Glucose, Bld: 184 mg/dL — ABNORMAL HIGH (ref 65–99)
POTASSIUM: 3.9 mmol/L (ref 3.5–5.1)
Sodium: 138 mmol/L (ref 135–145)

## 2015-03-16 LAB — CBC
HCT: 25.9 % — ABNORMAL LOW (ref 39.0–52.0)
Hemoglobin: 8 g/dL — ABNORMAL LOW (ref 13.0–17.0)
MCH: 19.9 pg — AB (ref 26.0–34.0)
MCHC: 30.9 g/dL (ref 30.0–36.0)
MCV: 64.3 fL — AB (ref 78.0–100.0)
PLATELETS: 187 10*3/uL (ref 150–400)
RBC: 4.03 MIL/uL — ABNORMAL LOW (ref 4.22–5.81)
RDW: 17.5 % — AB (ref 11.5–15.5)
WBC: 10.8 10*3/uL — ABNORMAL HIGH (ref 4.0–10.5)

## 2015-03-16 LAB — URINE CULTURE

## 2015-03-16 LAB — GLUCOSE, CAPILLARY
GLUCOSE-CAPILLARY: 171 mg/dL — AB (ref 65–99)
Glucose-Capillary: 181 mg/dL — ABNORMAL HIGH (ref 65–99)
Glucose-Capillary: 150 mg/dL — ABNORMAL HIGH (ref 65–99)

## 2015-03-16 MED ORDER — CETYLPYRIDINIUM CHLORIDE 0.05 % MT LIQD
7.0000 mL | Freq: Two times a day (BID) | OROMUCOSAL | Status: DC
  Administered 2015-03-16 – 2015-03-24 (×13): 7 mL via OROMUCOSAL

## 2015-03-16 MED ORDER — SODIUM CHLORIDE 0.9 % IV SOLN
INTRAVENOUS | Status: AC
Start: 2015-03-16 — End: 2015-03-17
  Administered 2015-03-16: 14:00:00 via INTRAVENOUS

## 2015-03-16 MED ORDER — ENOXAPARIN SODIUM 60 MG/0.6ML ~~LOC~~ SOLN
0.5000 mg/kg | SUBCUTANEOUS | Status: DC
  Administered 2015-03-16 – 2015-03-24 (×9): 55 mg via SUBCUTANEOUS
  Filled 2015-03-16 (×10): qty 0.6

## 2015-03-16 MED ORDER — METRONIDAZOLE IN NACL 5-0.79 MG/ML-% IV SOLN
500.0000 mg | Freq: Three times a day (TID) | INTRAVENOUS | Status: DC
  Administered 2015-03-16 – 2015-03-17 (×3): 500 mg via INTRAVENOUS
  Filled 2015-03-16 (×3): qty 100

## 2015-03-16 NOTE — Progress Notes (Addendum)
Patient ID: [name], male   DOB: 02-19-1956, 59 y.o.   MRN: [id number] [facility] PROGRESS NOTE  [name] ZSW:[id number] DOB: 1955/12/08 DOA: 03/14/2015 PCP: PROVIDER NOT IN SYSTEM - follows in [place] center  Brief narrative:    59 y.o. male with history of diabetes mellitus type 2, chronic right lower extremity ulcer who presented to [facility] ED with fevers, chills and pain and swelling in B/L LE. He has extensive swelling in both legs. On admission, he was febrile with tachycardia, tachypnea. No leukocytosis. No evidence of osteomyelitis on x ray studies. MRI of the left foot showed diffuse cellulitis of the left leg and foot without abscess or osteomyelitis. Lower extremity doppler was negative for DVT. Ortho is seeing the patient in consultation.    Assessment/Plan:    Principal Problem: Sepsis due to left lower extremity cellulitis - Sepsis criteria met on admission with tachycardia, fever and source of infection cellulitis. Procalcitonin was elevated at 2.65 and lactic acid 2.01. - MRI of the left leg confirms extensive cellulitis without abscess - Continue current abx, vanco and zosyn - Blood cultures so far show no growth to date  - If he continues to spike a fever will ask ID for a consult  - Ortho has seen the pt in consultation - Esko consulted, appreciate their assessment. The LE ulcers are not pressure ulcers. He has a chronic venous stasis ulcers bilaterally. Right medial malleolus; medial and lateral left malleolus (R medial: 7cm x 4cm x 0.5cm ; L medial: 6cm x 4cm x 0.2cm; L lateral: 14cm x 4cm x 0.2cm). Right medial ulcer is deeper with moderate exudate and Left medial and lateral ulcers are full thickness. Dressing recommendations: Calcium alginate for exudate management, silver hydrofiber due to chronicity. Right LE wound to be covered with soft silicone foam to accommodate excess exudate. Orthopedic tech to replace Unna's boot to the RLE  Active Problems: Diabetes  mellitus with peripheral vascular manifestations and neuropathy  - Continue novolog 70/30 mix 20 units BID along with SSI - Check A1c - Continue gabapentin for neuropathy - Metformin on hold due to acute infection   Dyslipidemia - Continue statin therapy   Iron deficiency anemia  Continue iron supplementation   BPH - Continue Flomax    DVT Prophylaxis  - Lovenox subQ ordered while pt in hospital    Code Status: Full.  Family Communication:  plan of care discussed with the patient Disposition Plan: Continue to monitor in SDU because of sepsis, fever.   IV access:  Peripheral IV  Procedures and diagnostic studies:    Dg Chest 2 View 03/14/2015  Low lung volumes with bibasilar opacities favored to represent atelectasis.  Cardiac contours upper limits of normal.   Electronically Signed   By: Lovey Newcomer M.D.   On: 03/14/2015 21:07   Dg Tibia/fibula Right 03/14/2015  Soft tissue ulceration along the medial aspect of the distal tibia. No radiographic findings to suggest osteomyelitis.   Electronically Signed   By: Julian Hy M.D.   On: 03/14/2015 21:04   Mr Tibia Fibula Left Wo Contrast 03/16/2015  Diffuse cellulitis of the LEFT leg and foot, without abscess or osteomyelitis. Left-greater-than-right leg swelling with a diameter of the LEFT leg about 1.5 times the RIGHT leg.     Mr Foot Left Wo Contrast 03/16/2015  Diffuse cellulitis of the LEFT leg and foot, without abscess or osteomyelitis. Left-greater-than-right leg swelling with a diameter of the LEFT leg about 1.5 times the RIGHT leg.  Dg Foot Complete Left 03/14/2015  Diffuse swelling of the foot without plain film evidence of osteomyelitis.   Electronically Signed   By: Dereck Ligas M.D.   On: 03/14/2015 21:08    Medical Consultants:  Orthopedic surgery  Other Consultants:  [place]  IAnti-Infectives:   Vanco 03/14/2015 --> Zosyn 03/14/2015 -->    Leisa Lenz, MD  [facility] Pager [phone]  Time spent  in minutes: 25 minutes  If 7PM-7AM, please contact night-coverage [email or web address] Password [facility] 03/16/2015, 12:48 PM   LOS: 2 days    HPI/Subjective: No acute overnight events. Patient reports pain in lower extremities.   Objective: Filed Vitals:   03/16/15 0343 03/16/15 0800 03/16/15 0821 03/16/15 1200  BP:  137/69    Pulse:  96    Temp: 99.9 F (37.7 C)  102.8 F (39.3 C) 99.6 F (37.6 C)  TempSrc: Oral  Oral Oral  Resp:  29    Height:      Weight:      SpO2:  94%      Intake/Output Summary (Last 24 hours) at 03/16/15 1248 Last data filed at 03/16/15 0439  Gross per 24 hour  Intake   1180 ml  Output    525 ml  Net    655 ml    Exam:   General:  Pt is alert, follows commands appropriately, not in acute distress  Cardiovascular: Regular rate and rhythm, S1/S2 (+)  Respiratory: Clear to auscultation bilaterally, no wheezing, no crackles, no rhonchi  Abdomen: Soft, non tender, non distended, bowel sounds present  Extremities: bilateral leg edema, pulses DP and PT palpable bilaterally  Neuro: Grossly nonfocal  Data Reviewed: Basic Metabolic Panel:  Recent Labs Lab 03/14/15 1955 03/15/15 0110  NA 140 139  K 4.4 4.3  CL 108 109  CO2 23 24  GLUCOSE 189* 233*  BUN 22* 21*  CREATININE 1.12 1.23  CALCIUM 8.7* 7.7*   Liver Function Tests:  Recent Labs Lab 03/14/15 1955 03/15/15 0110  AST 38 37  ALT 59 52  ALKPHOS 125 131*  BILITOT 0.4 0.5  PROT 7.5 6.5  ALBUMIN 3.4* 2.7*   No results for input(s): LIPASE, AMYLASE in the last 168 hours. No results for input(s): AMMONIA in the last 168 hours. CBC:  Recent Labs Lab 03/14/15 1955 03/15/15 0110  WBC 8.1 8.7  NEUTROABS 6.5 6.8  HGB 8.2* 9.0*  HCT 26.3* 29.3*  MCV 66.8* 66.3*  PLT 239 193   Cardiac Enzymes:  Recent Labs Lab 03/15/15 0110  CKTOTAL 602*   BNP: Invalid input(s): POCBNP CBG:  Recent Labs Lab 03/15/15 1237 03/15/15 1745 03/15/15 2157 03/16/15 0836 03/16/15 1151   GLUCAP 153* 131* 194* 171* 181*    Urine culture     Status: None   Collection Time: 03/14/15  7:39 PM  Result Value Ref Range Status   Specimen Description URINE, CLEAN CATCH  Final   Special Requests NONE  Final   Culture   Final    MULTIPLE SPECIES PRESENT, SUGGEST RECOLLECTION IF CLINICALLY INDICATED Performed at [facility]    Report Status 03/16/2015 FINAL  Final  Blood culture (routine x 2)     Status: None (Preliminary result)   Collection Time: 03/14/15  7:55 PM  Result Value Ref Range Status   Specimen Description BLOOD RIGHT FOREARM  Final   Special Requests BOTTLES DRAWN AEROBIC AND ANAEROBIC 5CC  Final   Culture   Final    NO GROWTH 2 DAYS Performed at  [facility]    Report Status PENDING  Incomplete  Blood culture (routine x 2)     Status: None (Preliminary result)   Collection Time: 03/14/15  7:55 PM  Result Value Ref Range Status   Specimen Description BLOOD RIGHT ANTECUBITAL  Final   Special Requests BOTTLES DRAWN AEROBIC AND ANAEROBIC 5ML  Final   Culture   Final    NO GROWTH 2 DAYS Performed at [facility]    Report Status PENDING  Incomplete  MRSA PCR Screening     Status: None   Collection Time: 03/15/15  9:28 PM  Result Value Ref Range Status   MRSA by PCR NEGATIVE NEGATIVE Final     Scheduled Meds: . aspirin EC  81 mg Oral BH-q7a  . atorvastatin  80 mg Oral Daily  . ferrous sulfate  325 mg Oral TID WC  . gabapentin  300 mg Oral TID  . insulin aspart  0-9 Units Subcutaneous TID WC  . insulin aspart  20 Units Subcutaneous BID WC  . mirtazapine  15 mg Oral QHS  . oxybutynin  10 mg Oral Daily  . piperacillin-tazobactam   3.375 g Intravenous Q8H  . tamsulosin  0.4 mg Oral QPC supper  . vancomycin  750 mg Intravenous Q12H

## 2015-03-16 NOTE — Progress Notes (Signed)
   Subjective: Hospital day - 2 Patient reports pain as moderate with the left leg and foot. Patient seen in rounds for Dr. Wynelle Link. Patient is having problems with fever and pain in the left foot and leg, requiring pain medications He was transferred to Step Down yesterday due to elevated fevers. Currently receiving Vanc and Zosyn IV. Just completed the MIR on the lower extremities.  Rule out abscess that may require surgical decompression.  If no abscess and mainly cellulitis, then will rely on the IV antibiotics.  Spoke with Medicine and they will call for ID consult.  Objective: Vital signs in last 24 hours: Temp:  [98.4 F (36.9 C)-103.2 F (39.6 C)] 102.8 F (39.3 C) (06/23 0821) Pulse Rate:  [89-110] 98 (06/23 0200) Resp:  [19-24] 21 (06/23 0100) BP: (111-151)/(53-80) 111/53 mmHg (06/23 0200) SpO2:  [91 %-98 %] 95 % (06/23 0200)  Intake/Output from previous day:  Intake/Output Summary (Last 24 hours) at 03/16/15 0821 Last data filed at 03/16/15 0439  Gross per 24 hour  Intake 2857.5 ml  Output    700 ml  Net 2157.5 ml    Labs:  Recent Labs  03/14/15 1955 03/15/15 0110  HGB 8.2* 9.0*    Recent Labs  03/14/15 1955 03/15/15 0110 03/15/15 0645  WBC 8.1 8.7  --   RBC 3.94* 4.42 4.52  HCT 26.3* 29.3*  --   PLT 239 193  --     Recent Labs  03/14/15 1955 03/15/15 0110  NA 140 139  K 4.4 4.3  CL 108 109  CO2 23 24  BUN 22* 21*  CREATININE 1.12 1.23  GLUCOSE 189* 233*  CALCIUM 8.7* 7.7*    Recent Labs  03/15/15 0110  INR 1.21    EXAM General - Patient is Alert and Appropriate Extremity - moderate edema throughout the left lower extremitiy.  Leg is warm to the touch and tender. Ulcers noted over medial malleolus and just distal to the lateral malleolus over the later proximal foot. Slight odor but no obvious active purulent drainage noted at time of dressing change.  Past Medical History  Diagnosis Date  . Diabetes mellitus without  complication   . Hypertension   . Chronic hip pain   . GERD (gastroesophageal reflux disease)   . Depression     Assessment/Plan: Hospital day - 2 Principal Problem:   Sepsis Active Problems:   Cellulitis of both lower extremities   Diabetes mellitus type 2, controlled   Microcytic anemia   Pressure ulcer   Secondary diabetes with peripheral vascular disease  Estimated body mass index is 31.14 kg/(m^2) as calculated from the following:   Height as of this encounter: 6\' 1"  (1.854 m).   Weight as of this encounter: 107.049 kg (236 lb).  Currently on IV ABX treatment with Vanc and Zosyn. Await MRI to rule out abscess.  Arlee Muslim, PA-C Orthopaedic Surgery 03/16/2015, 8:21 AM

## 2015-03-16 NOTE — Progress Notes (Signed)
ANTICOAGULATION CONSULT NOTE - Initial Consult  Pharmacy Consult for Lovenox Indication: VTE prophylaxis  Allergies  Allergen Reactions  . Other     Pt is a Jehovah Witness. No blood products.  . Sulfa Antibiotics     Causes flu-like symptom : sweating, chills, fever, body aches    Patient Measurements: Height: 6\' 1"  (185.4 cm) Weight: 236 lb (107.049 kg) IBW/kg (Calculated) : 79.9   Vital Signs: Temp: 99.6 F (37.6 C) (06/23 1200) Temp Source: Oral (06/23 1200) BP: 137/69 mmHg (06/23 0800) Pulse Rate: 96 (06/23 0800)  Labs:  Recent Labs  03/14/15 1955 03/15/15 0110  HGB 8.2* 9.0*  HCT 26.3* 29.3*  PLT 239 193  APTT  --  33  LABPROT  --  15.5*  INR  --  1.21  CREATININE 1.12 1.23  CKTOTAL  --  602*    Estimated Creatinine Clearance: 80.9 mL/min (by C-G formula based on Cr of 1.23).   Medical History: Past Medical History  Diagnosis Date  . Diabetes mellitus without complication   . Hypertension   . Chronic hip pain   . GERD (gastroesophageal reflux disease)   . Depression     Medications:  Scheduled:  . antiseptic oral rinse  7 mL Mouth Rinse BID  . aspirin EC  81 mg Oral BH-q7a  . atorvastatin  80 mg Oral Daily  . ferrous sulfate  325 mg Oral TID WC  . gabapentin  300 mg Oral TID  . insulin aspart  0-9 Units Subcutaneous TID WC  . insulin aspart protamine- aspart  20 Units Subcutaneous BID WC  . mirtazapine  15 mg Oral QHS  . oxybutynin  10 mg Oral Daily  . piperacillin-tazobactam (ZOSYN)  IV  3.375 g Intravenous Q8H  . tamsulosin  0.4 mg Oral QPC supper  . vancomycin  750 mg Intravenous Q12H   PRN: acetaminophen **OR** acetaminophen, HYDROmorphone (DILAUDID) injection, ondansetron **OR** ondansetron (ZOFRAN) IV, polyvinyl alcohol  Assessment: 59 y.o. male with history of diabetes mellitus type 2, chronic right lower extremity venous stasis ulcer who presented to [facility] ED with fevers, chills and pain and swelling in B/L LE.  Abscess r/o on MRI; to  begin Lovenox per pharmacy for VTE prophylaxis.  No PTA anticoagulation Hgb low d/t IDA but improved from yesterday; Plt wnl SCr slight bump; CrCl 81 CG; 64 N  Goal of Therapy:  Prevention of VTE Monitor platelets by anticoagulation protocol: Yes   Plan:   Lovenox 55 mg SQ q24 hr (using 0.5 mg/kg w/ obesity)  SCr, CBC at least weekly while on enoxaparin  Pharmacy to sign off note writing, will follow at a distance  Reuel Boom, PharmD Pager: [phone] 03/16/2015, 1:42 PM

## 2015-03-17 ENCOUNTER — Inpatient Hospital Stay (HOSPITAL_COMMUNITY): Payer: Medicare Other

## 2015-03-17 ENCOUNTER — Encounter (HOSPITAL_COMMUNITY): Payer: Self-pay | Admitting: Radiology

## 2015-03-17 DIAGNOSIS — M60009 Infective myositis, unspecified site: Secondary | ICD-10-CM | POA: Diagnosis present

## 2015-03-17 DIAGNOSIS — R509 Fever, unspecified: Secondary | ICD-10-CM | POA: Diagnosis present

## 2015-03-17 DIAGNOSIS — E1151 Type 2 diabetes mellitus with diabetic peripheral angiopathy without gangrene: Secondary | ICD-10-CM

## 2015-03-17 DIAGNOSIS — L8990 Pressure ulcer of unspecified site, unspecified stage: Secondary | ICD-10-CM

## 2015-03-17 DIAGNOSIS — E1010 Type 1 diabetes mellitus with ketoacidosis without coma: Secondary | ICD-10-CM | POA: Diagnosis present

## 2015-03-17 DIAGNOSIS — D72829 Elevated white blood cell count, unspecified: Secondary | ICD-10-CM

## 2015-03-17 LAB — GLUCOSE, CAPILLARY
GLUCOSE-CAPILLARY: 150 mg/dL — AB (ref 65–99)
GLUCOSE-CAPILLARY: 97 mg/dL (ref 65–99)
Glucose-Capillary: 143 mg/dL — ABNORMAL HIGH (ref 65–99)
Glucose-Capillary: 132 mg/dL — ABNORMAL HIGH (ref 65–99)
Glucose-Capillary: 127 mg/dL — ABNORMAL HIGH (ref 65–99)

## 2015-03-17 LAB — URINALYSIS, ROUTINE W REFLEX MICROSCOPIC
Glucose, UA: NEGATIVE mg/dL
Ketones, ur: NEGATIVE mg/dL
LEUKOCYTES UA: NEGATIVE
Nitrite: NEGATIVE
PH: 6 (ref 5.0–8.0)
Protein, ur: 100 mg/dL — AB
SPECIFIC GRAVITY, URINE: 1.025 (ref 1.005–1.030)
UROBILINOGEN UA: 1 mg/dL (ref 0.0–1.0)

## 2015-03-17 LAB — HEMOGLOBIN A1C
HEMOGLOBIN A1C: 9.8 % — AB (ref 4.8–5.6)
MEAN PLASMA GLUCOSE: 235 mg/dL

## 2015-03-17 LAB — BASIC METABOLIC PANEL
ANION GAP: 9 (ref 5–15)
BUN: 17 mg/dL (ref 6–20)
CHLORIDE: 110 mmol/L (ref 101–111)
CO2: 22 mmol/L (ref 22–32)
Calcium: 8 mg/dL — ABNORMAL LOW (ref 8.9–10.3)
Creatinine, Ser: 1.07 mg/dL (ref 0.61–1.24)
GFR calc non Af Amer: >60 mL/min (ref >60)
Glucose, Bld: 123 mg/dL — ABNORMAL HIGH (ref 65–99)
Potassium: 3.7 mmol/L (ref 3.5–5.1)
Sodium: 141 mmol/L (ref 135–145)

## 2015-03-17 LAB — CBC
HEMATOCRIT: 25.4 % — AB (ref 39.0–52.0)
Hemoglobin: 8.1 g/dL — ABNORMAL LOW (ref 13.0–17.0)
MCH: 20.6 pg — ABNORMAL LOW (ref 26.0–34.0)
MCHC: 31.9 g/dL (ref 30.0–36.0)
MCV: 64.5 fL — ABNORMAL LOW (ref 78.0–100.0)
Platelets: 196 10*3/uL (ref 150–400)
RBC: 3.94 MIL/uL — ABNORMAL LOW (ref 4.22–5.81)
RDW: 17.2 % — ABNORMAL HIGH (ref 11.5–15.5)
WBC: 10.4 10*3/uL (ref 4.0–10.5)

## 2015-03-17 LAB — URINE MICROSCOPIC-ADD ON

## 2015-03-17 MED ORDER — IOHEXOL 300 MG/ML  SOLN
25.0000 mL | INTRAMUSCULAR | Status: AC
  Administered 2015-03-17 (×2): 25 mL via ORAL

## 2015-03-17 MED ORDER — HYDROMORPHONE HCL 1 MG/ML IJ SOLN
1.0000 mg | INTRAMUSCULAR | Status: DC | PRN
  Administered 2015-03-17 – 2015-03-20 (×16): 1 mg via INTRAVENOUS
  Filled 2015-03-17 (×16): qty 1

## 2015-03-17 MED ORDER — DOCUSATE SODIUM 100 MG PO CAPS
100.0000 mg | ORAL_CAPSULE | Freq: Two times a day (BID) | ORAL | Status: DC | PRN
  Administered 2015-03-18 – 2015-03-21 (×3): 100 mg via ORAL
  Filled 2015-03-17 (×3): qty 1

## 2015-03-17 MED ORDER — LINEZOLID 600 MG PO TABS
600.0000 mg | ORAL_TABLET | Freq: Two times a day (BID) | ORAL | Status: DC
  Administered 2015-03-17 – 2015-03-24 (×15): 600 mg via ORAL
  Filled 2015-03-17 (×16): qty 1

## 2015-03-17 MED ORDER — SODIUM CHLORIDE 0.9 % IV SOLN
3.0000 g | Freq: Four times a day (QID) | INTRAVENOUS | Status: DC
  Administered 2015-03-17 – 2015-03-24 (×29): 3 g via INTRAVENOUS
  Filled 2015-03-17 (×32): qty 3

## 2015-03-17 MED ORDER — IOHEXOL 300 MG/ML  SOLN
100.0000 mL | Freq: Once | INTRAMUSCULAR | Status: AC | PRN
  Administered 2015-03-17: 100 mL via INTRAVENOUS

## 2015-03-17 NOTE — Progress Notes (Addendum)
Patient ID: [name], male   DOB: Aug 30, 1956, 60 y.o.   MRN: [id number] [facility] PROGRESS NOTE  [name] IWP:[id number] DOB: 01-04-1956 DOA: 03/14/2015 PCP: PROVIDER NOT IN SYSTEM - Follows in [place] center  Brief narrative:    59 y.o. male who follows in [place] center so we do not have much information in EPIC of his previous medical record. Patient has diabetes mellitus type 2, chronic right lower extremity ulcer who presented to [facility] ED with fevers, chills and pain and swelling in B/L LE however left more so than the right LE.   On admission, patient was febrile with tachycardia, tachypnea. No leukocytosis. No evidence of osteomyelitis on x ray studies. MRI of the left foot showed diffuse cellulitis of the left leg and foot without abscess or osteomyelitis. Lower extremity doppler was negative for DVT. Ortho is seeing the patient in consultation and currently no indications for surgical intervention.  His hospital course is complicated with ongoing fevers while on IV abx. His blood cultures so far are negative. We added flagyl 03/16/2015 but pt continues to spike fever. ID consulted for input on management.    Assessment/Plan:    Principal Problem: Sepsis due to left lower extremity cellulitis / Leukocytosis / Fever of unknown origin - Sepsis criteria met on admission with tachycardia,tachypnea and fever. Initially no leukocytosis but he did have WBC count of 10.8 on 03/16/2015. Procalcitonin was elevated at 2.65 and lactic acid 2.01. Suspected source of infection is cellulitis in LLE. - Pt started on broad spectrum abx, vanco and zosyn on admission. His blood cultures from admission show no growth. Urine culture with multiple oragnisms, none predominant. - Pt continues to spike fever so we added flagyl 03/16/2015 and recollected blood cultures. We will also collect UA and urine culture today since the urine culture on admission not good specimen. We will also obtain CT chest and  abdomen/pelvis for further evaluation of fever of unknown origin.  - ID consulted and we appreciate their input.  Active Problems: Bilateral lower extremity edema with LE ulcers - Ortho has seen the pt in consultation with no recommendations for surgical intervention at this time. They agree with continuing current abx. -  WOC consulted, appreciate their assessment. The LE ulcers are not pressure ulcers. He has a chronic venous stasis ulcers bilaterally. Right medial malleolus; medial and lateral left malleolus (R medial: 7cm x 4cm x 0.5cm ; L medial: 6cm x 4cm x 0.2cm; L lateral: 14cm x 4cm x 0.2cm). Right medial ulcer is deeper with moderate exudate and Left medial and lateral ulcers are full thickness. Dressing recommendations: Calcium alginate for exudate management, silver hydrofiber due to chronicity. Right LE wound to be covered with soft silicone foam to accommodate excess exudate. - LE doppler negative for DVT  Diabetes mellitus with peripheral vascular manifestations and neuropathy, uncontrolled - Continue novolog 70/30 mix 20 units BID along with SSI - A1c on this admission 9.8 indicating poor glycemic control  - CBG's in past 24 hours: 150, 150, 97 - Continue gabapentin for neuropathy - Metformin on hold due to acute infection   Dyslipidemia - Continue statin therapy   Iron deficiency anemia Continue iron supplementation   BPH - Continue Flomax    DVT Prophylaxis  - Lovenox subQ ordered while pt in hospital    Code Status: Full.  Family Communication: plan of care discussed with the patient Disposition Plan: Continue to monitor in SDU because of persistent fever, sepsis    IV access:  Peripheral IV  Procedures and diagnostic studies:    Dg Chest 2 View 03/14/2015 Low lung volumes with bibasilar opacities favored to represent atelectasis. Cardiac contours upper limits of normal. Electronically Signed By: Lovey Newcomer M.D. On: 03/14/2015 21:07   Dg  Tibia/fibula Right 03/14/2015 Soft tissue ulceration along the medial aspect of the distal tibia. No radiographic findings to suggest osteomyelitis. Electronically Signed By: Julian Hy M.D. On: 03/14/2015 21:04   Mr Tibia Fibula Left Wo Contrast 03/16/2015 Diffuse cellulitis of the LEFT leg and foot, without abscess or osteomyelitis. Left-greater-than-right leg swelling with a diameter of the LEFT leg about 1.5 times the RIGHT leg.   Mr Foot Left Wo Contrast 03/16/2015 Diffuse cellulitis of the LEFT leg and foot, without abscess or osteomyelitis. Left-greater-than-right leg swelling with a diameter of the LEFT leg about 1.5 times the RIGHT leg.   Dg Foot Complete Left 03/14/2015 Diffuse swelling of the foot without plain film evidence of osteomyelitis. Electronically Signed By: Dereck Ligas M.D. On: 03/14/2015 21:08   Medical Consultants:  Orthopedic surgery Infectious disease  Other Consultants:  Diabetic coordinator   IAnti-Infectives:   Vanco 03/14/2015 --> Zosyn 03/14/2015 -->  Flagyl 03/16/2015 -->   Leisa Lenz, MD  [facility] Pager [phone]  Time spent in minutes: 25 minutes  If 7PM-7AM, please contact night-coverage [email or web address] Password [email or web address] 03/17/2015, 10:01 AM   LOS: 3 days    HPI/Subjective: No acute overnight events. Patient reports pain in his left leg.  Objective: Filed Vitals:   03/17/15 0200 03/17/15 0400 03/17/15 0600 03/17/15 0800  BP: 140/61 146/69 142/68   Pulse: 110 99 94   Temp:  102.7 F (39.3 C)  100.9 F (38.3 C)  TempSrc:  Axillary  Oral  Resp: 26 22 22    Height:      Weight:      SpO2: 94% 91% 97%     Intake/Output Summary (Last 24 hours) at 03/17/15 1001 Last data filed at 03/17/15 0700  Gross per 24 hour  Intake 1510.83 ml  Output    150 ml  Net 1360.83 ml    Exam:   General:  Pt is alert, follows commands appropriately, not in acute distress  Cardiovascular: Regular rate and rhythm, S1/S2  appreciated   Respiratory: Clear to auscultation bilaterally, no wheezing, no crackles, no rhonchi  Abdomen: Soft, non tender, non distended, bowel sounds present  Extremities: Left lower extremity edema more so than on the right leg, pulses palpable   Neuro: Grossly nonfocal  Data Reviewed: Basic Metabolic Panel:  Recent Labs Lab 03/14/15 1955 03/15/15 0110 03/16/15 1735 03/17/15 0345  NA 140 139 138 141  K 4.4 4.3 3.9 3.7  CL 108 109 107 110  CO2 23 24 23 22   GLUCOSE 189* 233* 184* 123*  BUN 22* 21* 18 17  CREATININE 1.12 1.23 1.02 1.07  CALCIUM 8.7* 7.7* 7.9* 8.0*   Liver Function Tests:  Recent Labs Lab 03/14/15 1955 03/15/15 0110  AST 38 37  ALT 59 52  ALKPHOS 125 131*  BILITOT 0.4 0.5  PROT 7.5 6.5  ALBUMIN 3.4* 2.7*   No results for input(s): LIPASE, AMYLASE in the last 168 hours. No results for input(s): AMMONIA in the last 168 hours. CBC:  Recent Labs Lab 03/14/15 1955 03/15/15 0110 03/16/15 1735 03/17/15 0345  WBC 8.1 8.7 10.8* 10.4  NEUTROABS 6.5 6.8  --   --   HGB 8.2* 9.0* 8.0* 8.1*  HCT 26.3* 29.3* 25.9* 25.4*  MCV 66.8* 66.3* 64.3* 64.5*  PLT 239 193 187 196   Cardiac Enzymes:  Recent Labs Lab 03/15/15 0110  CKTOTAL 602*   BNP: Invalid input(s): POCBNP CBG:  Recent Labs Lab 03/16/15 0836 03/16/15 1151 03/16/15 1623 03/16/15 2133 03/17/15 0743  GLUCAP 171* 181* 150* 150* 97    Recent Results (from the past 240 hour(s))  Urine culture     Status: None   Collection Time: 03/14/15  7:39 PM  Result Value Ref Range Status   Specimen Description URINE, CLEAN CATCH  Final   Special Requests NONE  Final   Culture   Final    MULTIPLE SPECIES PRESENT, SUGGEST RECOLLECTION IF CLINICALLY INDICATED Performed at [facility]    Report Status 03/16/2015 FINAL  Final  Blood culture (routine x 2)     Status: None (Preliminary result)   Collection Time: 03/14/15  7:55 PM  Result Value Ref Range Status   Specimen  Description BLOOD RIGHT FOREARM  Final   Special Requests BOTTLES DRAWN AEROBIC AND ANAEROBIC 5CC  Final   Culture   Final    NO GROWTH 2 DAYS Performed at [facility]    Report Status PENDING  Incomplete  Blood culture (routine x 2)     Status: None (Preliminary result)   Collection Time: 03/14/15  7:55 PM  Result Value Ref Range Status   Specimen Description BLOOD RIGHT ANTECUBITAL  Final   Special Requests BOTTLES DRAWN AEROBIC AND ANAEROBIC 5ML  Final   Culture   Final    NO GROWTH 2 DAYS Performed at [facility]    Report Status PENDING  Incomplete  MRSA PCR Screening     Status: None   Collection Time: 03/15/15  9:28 PM  Result Value Ref Range Status   MRSA by PCR NEGATIVE NEGATIVE Final     Scheduled Meds: . antiseptic oral rinse  7 mL Mouth Rinse BID  . aspirin EC  81 mg Oral BH-q7a  . atorvastatin  80 mg Oral Daily  . enoxaparin (LOVENOX) injection  0.5 mg/kg Subcutaneous Q24H  . ferrous sulfate  325 mg Oral TID WC  . gabapentin  300 mg Oral TID  . insulin aspart  0-9 Units Subcutaneous TID WC  . insulin aspart protamine- aspart  20 Units Subcutaneous BID WC  . metronidazole  500 mg Intravenous Q8H  . mirtazapine  15 mg Oral QHS  . oxybutynin  10 mg Oral Daily  . piperacillin-tazobactam (ZOSYN)  IV  3.375 g Intravenous Q8H  . tamsulosin  0.4 mg Oral QPC supper  . vancomycin  750 mg Intravenous Q12H   Continuous Infusions: . sodium chloride 50 mL/hr at 03/16/15 1347

## 2015-03-17 NOTE — Progress Notes (Signed)
Subjective: Patient still complains of LLE pain    Objective: Vital signs in last 24 hours: Temp:  [99.6 F (37.6 C)-103.4 F (39.7 C)] 102.7 F (39.3 C) (06/24 0400) Pulse Rate:  [89-110] 94 (06/24 0600) Resp:  [22-33] 22 (06/24 0600) BP: (135-163)/(61-95) 142/68 mmHg (06/24 0600) SpO2:  [91 %-100 %] 97 % (06/24 0600)  Intake/Output from previous day: 06/23 0701 - 06/24 0700 In: 1510.8 [I.V.:860.8; IV Piggyback:650] Out: 150 [Urine:150] Intake/Output this shift:     Recent Labs  03/14/15 1955 03/15/15 0110 03/16/15 1735 03/17/15 0345  HGB 8.2* 9.0* 8.0* 8.1*    Recent Labs  03/16/15 1735 03/17/15 0345  WBC 10.8* 10.4  RBC 4.03* 3.94*  HCT 25.9* 25.4*  PLT 187 196    Recent Labs  03/16/15 1735 03/17/15 0345  NA 138 141  K 3.9 3.7  CL 107 110  CO2 23 22  BUN 18 17  CREATININE 1.02 1.07  GLUCOSE 184* 123*  CALCIUM 7.9* 8.0*    Recent Labs  03/15/15 0110  INR 1.21    Sensation intact distally Compartment soft LLE swelling still significant but slightly decreased from admission Still febrile. No soft tissue crepitus WBC nornalized. Renal function normal   Assessment/Plan: LLE cellulitis- MRI showed no signs of abscess or fasciitis. Still with marked edema and cellulitis. Still spiking temps. No surgical indication at this time. Will follow   Julee Stoll V 03/17/2015, 7:38 AM

## 2015-03-17 NOTE — Progress Notes (Signed)
Inpatient Diabetes Program Recommendations  AACE/ADA: New Consensus Statement on Inpatient Glycemic Control (2013)  Target Ranges:  Prepandial:   less than 140 mg/dL      Peak postprandial:   less than 180 mg/dL (1-2 hours)      Critically ill patients:  140 - 180 mg/dL   Reason for Visit: Diabetes Consult  Diabetes history: DM2 Outpatient Diabetes medications: metformin 1000 mg bid, Novolog 70/30 40 units bid Current orders for Inpatient glycemic control: 70/30 20 units bid, Novolog sensitive tidwc  59 y.o. male with history of diabetes mellitus type 2 chronic right lower extremity ulcer on the leg, degenerative hip presents to the ER because of fever and chills. Chronic wound on R shin. Admitted with sepsis. Pt is [facility] at [facility] for diabetes.  Results for [name], [name] (MRN [id number]) as of 03/17/2015 09:16  Ref. Range 03/16/2015 14:20  Hemoglobin A1C Latest Ref Range: 4.8-5.6 % 9.8 (H)  Results for [name], [name] (MRN [id number]) as of 03/17/2015 09:16  Ref. Range 03/17/2015 03:45  Sodium Latest Ref Range: 135-145 mmol/L 141  Potassium Latest Ref Range: 3.5-5.1 mmol/L 3.7  Chloride Latest Ref Range: 101-111 mmol/L 110  CO2 Latest Ref Range: 22-32 mmol/L 22  BUN Latest Ref Range: 6-20 mg/dL 17  Creatinine Latest Ref Range: 0.61-1.24 mg/dL 1.07  Calcium Latest Ref Range: 8.9-10.3 mg/dL 8.0 (L)  EGFR (Non-African Amer.) Latest Ref Range: >60 mL/min >60  EGFR (African American) Latest Ref Range: >60 mL/min >60  Glucose Latest Ref Range: 65-99 mg/dL 123 (H)  Anion gap Latest Ref Range: 5-15  9  Results for [name], [name] (MRN [id number]) as of 03/17/2015 09:16  Ref. Range 03/16/2015 08:36 03/16/2015 11:51 03/16/2015 16:23 03/16/2015 21:33 03/17/2015 07:43  Glucose-Capillary Latest Ref Range: 65-99 mg/dL 171 (H) 181 (H) 150 (H) 150 (H) 97   Good control while inpatient. Agree with orders. Will talk with pt later this am.     Thank you. Lorenda Peck, RD, LDN, CDE Inpatient Diabetes  Coordinator [phone]

## 2015-03-17 NOTE — Consult Note (Signed)
[name]    Date of Admission:  03/14/2015  Date of Consult:  03/17/2015  Reason for Consult: persistent fevers, diabetic foot and ankle ulcers, ? pymyositis  Referring Physician: Dr. Charlies Silvers   HPI: [name] is an 59 y.o. male with poorly controlled DM with neuropathy and chronic diabetic foot ulcers presented to ED with fever, chills, left leg swelling, and blisters on left leg with drainage. He had blood cultures drawn and admitted under sepsis protocol with vancomycin and zosyn. His left leg is swollen and EXQUISITELY tender and there was concern for compartment syndrome and or pyomyositis and he had MRI which did not show evidence of either though it was done without contrast. He has had persistent fevers despite VERY broad spectrum antibiotics and has been seen by Orthopedic surgery, [facility].   Past Medical History  Diagnosis Date  . Diabetes mellitus without complication   . Hypertension   . Chronic hip pain   . GERD (gastroesophageal reflux disease)   . Depression     Past Surgical History  Procedure Laterality Date  . Rotator cuff repair    . Hernia repair    ergies:   Allergies  Allergen Reactions  . Other     Pt is a Jehovah Witness. No blood products.  . Sulfa Antibiotics     Causes flu-like symptom : sweating, chills, fever, body aches     Medications: I have reviewed patients current medications as documented in Epic Anti-infectives    Start     Dose/Rate Route Frequency Ordered Stop   03/17/15 1400  Ampicillin-Sulbactam (UNASYN) 3 g in sodium chloride 0.9 % 100 mL IVPB     3 g 100 mL/hr over 60 Minutes Intravenous Every 6 hours 03/17/15 1347     03/17/15 1400  linezolid (ZYVOX) tablet 600 mg     600 mg Oral Every 12 hours 03/17/15 1347     03/16/15 2000  metroNIDAZOLE (FLAGYL) IVPB 500 mg  Status:  Discontinued     500 mg 100 mL/hr over 60 Minutes Intravenous Every 8 hours 03/16/15 1924 03/17/15 1347   03/15/15  1000  vancomycin (VANCOCIN) IVPB 750 mg/150 ml premix  Status:  Discontinued     750 mg 150 mL/hr over 60 Minutes Intravenous Every 12 hours 03/15/15 0106 03/17/15 1347   03/15/15 0400  piperacillin-tazobactam (ZOSYN) IVPB 3.375 g  Status:  Discontinued     3.375 g 12.5 mL/hr over 240 Minutes Intravenous Every 8 hours 03/15/15 0104 03/17/15 1347   03/15/15 0115  vancomycin (VANCOCIN) IVPB 1000 mg/200 mL premix     1,000 mg 200 mL/hr over 60 Minutes Intravenous  Once 03/15/15 0106 03/15/15 0225   03/14/15 2000  vancomycin (VANCOCIN) IVPB 1000 mg/200 mL premix     1,000 mg 200 mL/hr over 60 Minutes Intravenous  Once 03/14/15 1950 03/14/15 2112   03/14/15 2000  piperacillin-tazobactam (ZOSYN) IVPB 3.375 g     3.375 g 100 mL/hr over 30 Minutes Intravenous  Once 03/14/15 1950 03/14/15 2041      Social History:  reports that he has been smoking.  He does not have any smokeless tobacco history on file. He reports that he drinks alcohol. His drug history is not on file.  Family History  Problem Relation Age of Onset  . Diabetes Mellitus II Mother   . Diabetes Mellitus II Father   . Diabetes Mellitus II Brother     As in HPI and  primary teams notes otherwise 12 point review of systems is negative  Blood pressure 156/70, pulse 109, temperature 100.9 F (38.3 C), temperature source Oral, resp. rate 25, height _0  (1.854 m), weight 236 lb (107.049 kg), SpO2 95 %. General: Alert and awake, oriented x3, coughing at times and slighltly somnolent HEENT: anicteric sclera, pupils reactive to light and accommodation, EOMI, oropharynx clear and without exudate CVS tachy regular rate, normal r,  no murmur rubs or gallops Chest: clear to auscultation bilaterally, no wheezing, rales or rhonchi Abdomen: soft nontender, nondistended, normal bowel sounds, Extremities:   Right leg extensively wrapped in dressing:  Left leg is tense and EXTREMELY tender to minor manipulation      Diabetic foot  ulcers pictured below        Neuro: nonfocal   Results for orders placed or performed during the hospital encounter of 03/14/15 (from the past 48 hour(s))  Glucose, capillary     Status: Abnormal   Collection Time: 03/15/15  5:45 PM  Result Value Ref Range   Glucose-Capillary 131 (H) 65 - 99 mg/dL  MRSA PCR Screening     Status: None   Collection Time: 03/15/15  9:28 PM  Result Value Ref Range   MRSA by PCR NEGATIVE NEGATIVE    Comment:        The GeneXpert MRSA Assay (FDA approved for NASAL specimens only), is one component of a comprehensive MRSA colonization surveillance program. It is not intended to diagnose MRSA infection nor to guide or monitor treatment for MRSA infections.   Glucose, capillary     Status: Abnormal   Collection Time: 03/15/15  9:57 PM  Result Value Ref Range   Glucose-Capillary 194 (H) 65 - 99 mg/dL  Glucose, capillary     Status: Abnormal   Collection Time: 03/16/15  8:36 AM  Result Value Ref Range   Glucose-Capillary 171 (H) 65 - 99 mg/dL   Comment 1 Notify RN    Comment 2 Document in Chart   Glucose, capillary     Status: Abnormal   Collection Time: 03/16/15 11:51 AM  Result Value Ref Range   Glucose-Capillary 181 (H) 65 - 99 mg/dL   Comment 1 Notify RN    Comment 2 Document in Chart   Hemoglobin A1c     Status: Abnormal   Collection Time: 03/16/15  2:20 PM  Result Value Ref Range   Hgb A1c MFr Bld 9.8 (H) 4.8 - 5.6 %    Comment: (NOTE)         Pre-diabetes: 5.7 - 6.4         Diabetes: >6.4         Glycemic control for adults with diabetes: <7.0    Mean Plasma Glucose 235 mg/dL    Comment: (NOTE) Performed At: [facility] [place], [place] [id number] Lindon Romp MD JG:[id number]   Glucose, capillary     Status: Abnormal   Collection Time: 03/16/15  4:23 PM  Result Value Ref Range   Glucose-Capillary 150 (H) 65 - 99 mg/dL   Comment 1 Notify RN    Comment 2 Document in Chart   CBC     Status:  Abnormal   Collection Time: 03/16/15  5:35 PM  Result Value Ref Range   WBC 10.8 (H) 4.0 - 10.5 K/uL   RBC 4.03 (L) 4.22 - 5.81 MIL/uL   Hemoglobin 8.0 (L) 13.0 - 17.0 g/dL   HCT 25.9 (L) 39.0 - 52.0 %  MCV 64.3 (L) 78.0 - 100.0 fL   MCH 19.9 (L) 26.0 - 34.0 pg   MCHC 30.9 30.0 - 36.0 g/dL   RDW 17.5 (H) 11.5 - 15.5 %   Platelets 187 150 - 400 K/uL  Basic metabolic panel     Status: Abnormal   Collection Time: 03/16/15  5:35 PM  Result Value Ref Range   Sodium 138 135 - 145 mmol/L   Potassium 3.9 3.5 - 5.1 mmol/L   Chloride 107 101 - 111 mmol/L   CO2 23 22 - 32 mmol/L   Glucose, Bld 184 (H) 65 - 99 mg/dL   BUN 18 6 - 20 mg/dL   Creatinine, Ser 1.02 0.61 - 1.24 mg/dL   Calcium 7.9 (L) 8.9 - 10.3 mg/dL   GFR calc non Af Amer >60 >60 mL/min   GFR calc Af Amer >60 >60 mL/min    Comment: (NOTE) The eGFR has been calculated using the CKD EPI equation. This calculation has not been validated in all clinical situations. eGFR's persistently <60 mL/min signify possible Chronic Kidney Disease.    Anion gap 8 5 - 15  Glucose, capillary     Status: Abnormal   Collection Time: 03/16/15  9:33 PM  Result Value Ref Range   Glucose-Capillary 150 (H) 65 - 99 mg/dL  CBC     Status: Abnormal   Collection Time: 03/17/15  3:45 AM  Result Value Ref Range   WBC 10.4 4.0 - 10.5 K/uL   RBC 3.94 (L) 4.22 - 5.81 MIL/uL   Hemoglobin 8.1 (L) 13.0 - 17.0 g/dL   HCT 25.4 (L) 39.0 - 52.0 %   MCV 64.5 (L) 78.0 - 100.0 fL   MCH 20.6 (L) 26.0 - 34.0 pg   MCHC 31.9 30.0 - 36.0 g/dL   RDW 17.2 (H) 11.5 - 15.5 %   Platelets 196 150 - 400 K/uL  Basic metabolic panel     Status: Abnormal   Collection Time: 03/17/15  3:45 AM  Result Value Ref Range   Sodium 141 135 - 145 mmol/L   Potassium 3.7 3.5 - 5.1 mmol/L   Chloride 110 101 - 111 mmol/L   CO2 22 22 - 32 mmol/L   Glucose, Bld 123 (H) 65 - 99 mg/dL   BUN 17 6 - 20 mg/dL   Creatinine, Ser 1.07 0.61 - 1.24 mg/dL   Calcium 8.0 (L) 8.9 - 10.3 mg/dL    GFR calc non Af Amer >60 >60 mL/min   GFR calc Af Amer >60 >60 mL/min    Comment: (NOTE) The eGFR has been calculated using the CKD EPI equation. This calculation has not been validated in all clinical situations. eGFR's persistently <60 mL/min signify possible Chronic Kidney Disease.    Anion gap 9 5 - 15  Glucose, capillary     Status: None   Collection Time: 03/17/15  7:43 AM  Result Value Ref Range   Glucose-Capillary 97 65 - 99 mg/dL   Comment 1 Notify RN    Comment 2 Document in Chart   Urinalysis, Routine w reflex microscopic (not at [facility])     Status: Abnormal   Collection Time: 03/17/15 11:20 AM  Result Value Ref Range   Color, Urine AMBER (A) YELLOW    Comment: BIOCHEMICALS MAY BE AFFECTED BY COLOR   APPearance CLOUDY (A) CLEAR   Specific Gravity, Urine 1.025 1.005 - 1.030   pH 6.0 5.0 - 8.0   Glucose, UA NEGATIVE NEGATIVE mg/dL   Hgb urine  dipstick MODERATE (A) NEGATIVE   Bilirubin Urine SMALL (A) NEGATIVE   Ketones, ur NEGATIVE NEGATIVE mg/dL   Protein, ur 100 (A) NEGATIVE mg/dL   Urobilinogen, UA 1.0 0.0 - 1.0 mg/dL   Nitrite NEGATIVE NEGATIVE   Leukocytes, UA NEGATIVE NEGATIVE  Urine microscopic-add on     Status: Abnormal   Collection Time: 03/17/15 11:20 AM  Result Value Ref Range   Squamous Epithelial / LPF FEW (A) RARE   WBC, UA 3-6 <3 WBC/hpf   RBC / HPF 7-10 <3 RBC/hpf   Bacteria, UA FEW (A) RARE   Casts GRANULAR CAST (A) NEGATIVE  Glucose, capillary     Status: Abnormal   Collection Time: 03/17/15 11:27 AM  Result Value Ref Range   Glucose-Capillary 143 (H) 65 - 99 mg/dL   _0 (sdes,specrequest,cult,reptstatus)   ) Recent Results (from the past 720 hour(s))  Urine culture     Status: None   Collection Time: 03/14/15  7:39 PM  Result Value Ref Range Status   Specimen Description URINE, CLEAN CATCH  Final   Special Requests NONE  Final   Culture   Final    MULTIPLE SPECIES PRESENT, SUGGEST RECOLLECTION IF CLINICALLY  INDICATED Performed at [facility]    Report Status 03/16/2015 FINAL  Final  Blood culture (routine x 2)     Status: None (Preliminary result)   Collection Time: 03/14/15  7:55 PM  Result Value Ref Range Status   Specimen Description BLOOD RIGHT FOREARM  Final   Special Requests BOTTLES DRAWN AEROBIC AND ANAEROBIC 5CC  Final   Culture   Final    NO GROWTH 2 DAYS Performed at [facility]    Report Status PENDING  Incomplete  Blood culture (routine x 2)     Status: None (Preliminary result)   Collection Time: 03/14/15  7:55 PM  Result Value Ref Range Status   Specimen Description BLOOD RIGHT ANTECUBITAL  Final   Special Requests BOTTLES DRAWN AEROBIC AND ANAEROBIC 5ML  Final   Culture   Final    NO GROWTH 2 DAYS Performed at [facility]    Report Status PENDING  Incomplete  MRSA PCR Screening     Status: None   Collection Time: 03/15/15  9:28 PM  Result Value Ref Range Status   MRSA by PCR NEGATIVE NEGATIVE Final    Comment:        The GeneXpert MRSA Assay (FDA approved for NASAL specimens only), is one component of a comprehensive MRSA colonization surveillance program. It is not intended to diagnose MRSA infection nor to guide or monitor treatment for MRSA infections.      Impression/Recommendation  Principal Problem:   Sepsis Active Problems:   Cellulitis of both lower extremities   Diabetes mellitus type 2, controlled   Microcytic anemia   Pressure ulcer   Secondary diabetes with peripheral vascular disease   [name] is a 59 y.o. male with  Poorly controlled DM with diabetic foot ulcers and admission with sepsis, new diabetic foot ulcers and exquisitely tender lower leg.  #1 Sepsis: I suspect that he has a smoldering PYOMYOSITIS in his left lower leg. Typical agents here would be MRSA, MSSA, Strep species +/- anaerobes  --Will change to zyvox for excellent tissue penetration and toxin inhibition --unasyn for anerobic coverage  and to engage a beta lactam if this is mSSa or Strep --I would plan on REPEAT MRI WITH CONTRAST OF HIS LEFT FOOT, AND MORE IMPORTANTLY LEFT LOWER LEG  AND ALSO GET MRI OF THE OPPOSITE LEG AND FOOT IF STILL FEBRILE  I WOULD HOLD OFF ON DOING SO UNTIL Sunday UNLESS HE HAS OTHER ACUTE DETERIORATION IN MEANTIME  I EXPECT WHEN WE REPEAT IMAGING WE WILL FIND AND EVOLVING PYOMYOSITIS THAT WILL REQUIRE I AND D BY ORTHOPEDICS BUT WOULD LIKE TO GIVE TIME FOR THE IMAGES TO SHOW THIS  ELEVATE LEG  #2 DFU: Engage DIabetic foot focused order set  Dr. Linus Salmons is covering this weekend.   03/17/2015, 3:12 PM   Thank you so much for this interesting consult  [facility] [place] [phone] (pager) [phone] (office) 03/17/2015, 3:12 PM  Rhina Brackett [place] 03/17/2015, 3:12 PM

## 2015-03-17 NOTE — Evaluation (Signed)
Physical Therapy Evaluation Patient Details Name: [name] MRN: [id number] DOB: December 03, 1955 Today's Date: 03/17/2015   History of Present Illness  [name] is a 59 y.o. male hx of DM, HTN, reflux, chronic red wound here presenting 03/14/15  with fever, worsening pain from leg ulcers. Patient has chronic leg ulcers. States that left leg has been more red and swollen but there is nothing draining from the ulcer. However over the last 2 weeks the right leg ulcer has purulent drainage and is more painful.   Clinical Impression  Patient  Gave effort to mobilize, very limited due to pain of legs and edema of trunk and especially L leg. Significant drainage from L LEG such that pad and bed linen under L leg was saturated. Patient will benefit from PT to address problems listed in note below.   Follow Up Recommendations SNF;Supervision/Assistance - 24 hour    Equipment Recommendations  None recommended by PT    Recommendations for Other Services       Precautions / Restrictions Precautions Precautions: Fall Precaution Comments: L Leg weeping a lit, una boot on R      Mobility  Bed Mobility Overal bed mobility: Needs Assistance;+2 for physical assistance;+ 2 for safety/equipment Bed Mobility: Supine to Sit;Sit to Supine;Rolling Rolling: Max assist;+2 for physical assistance;+2 for safety/equipment   Supine to sit: Max assist;HOB elevated;+2 for physical assistance;+2 for safety/equipment Sit to supine: +2 for safety/equipment;+2 for physical assistance;Total assist   General bed mobility comments: patient requires significant assistance to get to partial sitting on  edge of bed, required total assist to get legs and trunk back into bed.  Transfers                 General transfer comment: unavle to attempt  Ambulation/Gait                Stairs            Wheelchair Mobility    Modified Rankin (Stroke Patients Only)       Balance Overall balance  assessment: Needs assistance Sitting-balance support: Bilateral upper extremity supported;Feet supported Sitting balance-Leahy Scale: Poor   Postural control: Right lateral lean;Posterior lean                                   Pertinent Vitals/Pain Pain Assessment: 0-10 Pain Score: 10-Worst pain ever Pain Location: L>R leg Pain Descriptors / Indicators: Aching;Constant;Discomfort;Grimacing;Guarding Pain Intervention(s): Limited activity within patient's tolerance;Monitored during session;Premedicated before session;Repositioned    Home Living Family/patient expects to be discharged to:: [facility]                 Additional Comments: resides at [facility]    Prior Function Level of Independence: Independent with assistive device(s)         Comments: uses cane or Rollator, states recently was not needing a cane, 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     Hand Dominance        Extremity/Trunk Assessment   Upper Extremity Assessment: Generalized weakness           Lower Extremity Assessment: RLE deficits/detail;LLE deficits/detail RLE Deficits / Details: able to raise from bed LLE Deficits / Details: significant edema present, requires assistance to lift leg, unable to tolerate  knee flexion  Cervical / Trunk Assessment: Other exceptions  Communication   Communication: No difficulties  Cognition Arousal/Alertness: Awake/alert Behavior During Therapy: [facility]  for tasks assessed/performed Overall Cognitive Status: Within Functional Limits for tasks assessed                      General Comments      Exercises        Assessment/Plan    PT Assessment Patient needs continued PT services  PT Diagnosis Difficulty walking;Generalized weakness;Acute pain   PT Problem List Decreased strength;Decreased range of motion;Decreased activity tolerance;Decreased balance;Decreased mobility;Cardiopulmonary status limiting activity;Decreased  knowledge of precautions;Decreased safety awareness;Decreased knowledge of use of DME;Decreased skin integrity;Pain  PT Treatment Interventions DME instruction;Gait training;Functional mobility training;Therapeutic activities;Therapeutic exercise;Patient/family education   PT Goals (Current goals can be found in the Care Plan section) Acute Rehab PT Goals Patient Stated Goal: to get up and walk PT Goal Formulation: With patient Time For Goal Achievement: 03/31/15 Potential to Achieve Goals: Fair    Frequency Min 3X/week   Barriers to discharge Decreased caregiver support      Co-evaluation               End of Session   Activity Tolerance: Patient limited by pain Patient left: in bed;with call bell/phone within reach;with nursing/sitter in room Nurse Communication: Mobility status         Time: 1740-8144 PT Time Calculation (min) (ACUTE ONLY): 29 min   Charges:   PT Evaluation $Initial PT Evaluation Tier I: 1 Procedure PT Treatments $Therapeutic Activity: 8-22 mins   PT G Codes:        [name] 03/17/2015, 3:16 PM [name] PT [phone]

## 2015-03-17 NOTE — Progress Notes (Addendum)
03/17/15 1600 Paged MD for a stool softner or laxative. Pt has not had a BM since admission. Added colace for constipation. Leisa Lenz

## 2015-03-17 NOTE — Progress Notes (Signed)
ANTIBIOTIC CONSULT NOTE - FOLLOW UP  Pharmacy Consult for Vancomycin, Zosyn Indication: Cellulitis and ulceration of LE  Allergies  Allergen Reactions  . Other     Pt is a Jehovah Witness. No blood products.  . Sulfa Antibiotics     Causes flu-like symptom : sweating, chills, fever, body aches    Patient Measurements: Height: 6\' 1"  (185.4 cm) Weight: 236 lb (107.049 kg) IBW/kg (Calculated) : 79.9  Vital Signs: Temp: 100.9 F (38.3 C) (06/24 0800) Temp Source: Oral (06/24 0800) BP: 156/70 mmHg (06/24 1200) Pulse Rate: 109 (06/24 1200) Intake/Output from previous day: 06/23 0701 - 06/24 0700 In: 1510.8 [I.V.:860.8; IV Piggyback:650] Out: 150 [Urine:150] Intake/Output from this shift: Total I/O In: 562.5 [I.V.:262.5; IV Piggyback:300] Out: -   Labs:  Recent Labs  03/15/15 0110 03/16/15 1735 03/17/15 0345  WBC 8.7 10.8* 10.4  HGB 9.0* 8.0* 8.1*  PLT 193 187 196  CREATININE 1.23 1.02 1.07   Estimated Creatinine Clearance: 93 mL/min (by C-G formula based on Cr of 1.07). No results for input(s): VANCOTROUGH, VANCOPEAK, VANCORANDOM, GENTTROUGH, GENTPEAK, GENTRANDOM, TOBRATROUGH, TOBRAPEAK, TOBRARND, AMIKACINPEAK, AMIKACINTROU, AMIKACIN in the last 72 hours.   Microbiology: Recent Results (from the past 720 hour(s))  Urine culture     Status: None   Collection Time: 03/14/15  7:39 PM  Result Value Ref Range Status   Specimen Description URINE, CLEAN CATCH  Final   Special Requests NONE  Final   Culture   Final    MULTIPLE SPECIES PRESENT, SUGGEST RECOLLECTION IF CLINICALLY INDICATED Performed at [facility]    Report Status 03/16/2015 FINAL  Final  Blood culture (routine x 2)     Status: None (Preliminary result)   Collection Time: 03/14/15  7:55 PM  Result Value Ref Range Status   Specimen Description BLOOD RIGHT FOREARM  Final   Special Requests BOTTLES DRAWN AEROBIC AND ANAEROBIC 5CC  Final   Culture   Final    NO GROWTH 2 DAYS Performed at [facility]    Report Status PENDING  Incomplete  Blood culture (routine x 2)     Status: None (Preliminary result)   Collection Time: 03/14/15  7:55 PM  Result Value Ref Range Status   Specimen Description BLOOD RIGHT ANTECUBITAL  Final   Special Requests BOTTLES DRAWN AEROBIC AND ANAEROBIC 5ML  Final   Culture   Final    NO GROWTH 2 DAYS Performed at [facility]    Report Status PENDING  Incomplete  MRSA PCR Screening     Status: None   Collection Time: 03/15/15  9:28 PM  Result Value Ref Range Status   MRSA by PCR NEGATIVE NEGATIVE Final    Comment:        The GeneXpert MRSA Assay (FDA approved for NASAL specimens only), is one component of a comprehensive MRSA colonization surveillance program. It is not intended to diagnose MRSA infection nor to guide or monitor treatment for MRSA infections.      Assessment: 55 yoM admitted 6/21 with fever, BLE wounds and swelling.  PMH includes DM, chronic RLE ulcer, HTN, GERD, depression, and degenerative hip pain.  No abscess on MRI, no sign of osteo, compartment syndrome, or nec fasciitis. No surgery planned.  Pharmacy is consulted to dose vancomycin and Zosyn.  Metronidazole was added for ongoing fevers.  6/21 >> Zosyn  >> 6/21 >> Vancomycin  >>   6/23 >> Metronidazole >>  6/21 blood: NGTD 6/21 urine: multiple species, none predom (Final)  6/23 Blood x2: sent 6/24 Urine: sent  Today, 03/17/2015: Day #4 Vancomycin and Zosyn, Day #2 metronidazole  Tmax: 103.4 (Tc 100.9)  WBC improved to 10.4  SCr 1.07 with CrCl ~ 93 ml/min CG, ~ 73 ml/min N  Goal of Therapy:  Vancomycin trough level 15-20 mcg/ml  Appropriate abx dosing, eradication of infection.   Plan:   Continue Zosyn 3.375g IV Q8H infused over 4hrs.  Continue Vancomycin 750mg  IV q12h.  Measure Vanc trough at steady state, 6/24 prior to 2200 dose.  Follow up renal fxn, culture results, and clinical course.  Follow up narrowing antibiotics:  MD,  Zosyn covers anaerobes and metronidazole will not add additional anaerobe coverage.   Gretta Arab PharmD, BCPS Pager [phone] 03/17/2015 1:25 PM

## 2015-03-18 ENCOUNTER — Inpatient Hospital Stay (HOSPITAL_COMMUNITY): Payer: Medicare Other

## 2015-03-18 DIAGNOSIS — L03119 Cellulitis of unspecified part of limb: Secondary | ICD-10-CM

## 2015-03-18 DIAGNOSIS — L02419 Cutaneous abscess of limb, unspecified: Secondary | ICD-10-CM

## 2015-03-18 DIAGNOSIS — L03116 Cellulitis of left lower limb: Secondary | ICD-10-CM | POA: Diagnosis present

## 2015-03-18 DIAGNOSIS — A4101 Sepsis due to Methicillin susceptible Staphylococcus aureus: Principal | ICD-10-CM

## 2015-03-18 LAB — BASIC METABOLIC PANEL
Anion gap: 7 (ref 5–15)
BUN: 15 mg/dL (ref 6–20)
CHLORIDE: 106 mmol/L (ref 101–111)
CO2: 23 mmol/L (ref 22–32)
Calcium: 8 mg/dL — ABNORMAL LOW (ref 8.9–10.3)
Creatinine, Ser: 1.13 mg/dL (ref 0.61–1.24)
GLUCOSE: 169 mg/dL — AB (ref 65–99)
POTASSIUM: 3.8 mmol/L (ref 3.5–5.1)
Sodium: 136 mmol/L (ref 135–145)

## 2015-03-18 LAB — URINE CULTURE: Culture: NO GROWTH

## 2015-03-18 LAB — GLUCOSE, CAPILLARY
GLUCOSE-CAPILLARY: 152 mg/dL — AB (ref 65–99)
Glucose-Capillary: 197 mg/dL — ABNORMAL HIGH (ref 65–99)
Glucose-Capillary: 175 mg/dL — ABNORMAL HIGH (ref 65–99)

## 2015-03-18 LAB — CBC
HEMATOCRIT: 25.3 % — AB (ref 39.0–52.0)
HEMOGLOBIN: 8.2 g/dL — AB (ref 13.0–17.0)
MCH: 20.9 pg — ABNORMAL LOW (ref 26.0–34.0)
MCHC: 32.4 g/dL (ref 30.0–36.0)
MCV: 64.5 fL — AB (ref 78.0–100.0)
Platelets: 202 10*3/uL (ref 150–400)
RBC: 3.92 MIL/uL — ABNORMAL LOW (ref 4.22–5.81)
RDW: 17.3 % — AB (ref 11.5–15.5)
WBC: 11.7 10*3/uL — AB (ref 4.0–10.5)

## 2015-03-18 LAB — C-REACTIVE PROTEIN
CRP: 48.4 mg/dL — ABNORMAL HIGH (ref <1.0)
CRP: >48.8 mg/dL — ABNORMAL HIGH (ref <1.0)

## 2015-03-18 LAB — PREALBUMIN: Prealbumin: 2.4 mg/dL — ABNORMAL LOW (ref 18–38)

## 2015-03-18 MED ORDER — OXYCODONE-ACETAMINOPHEN 5-325 MG PO TABS
1.0000 | ORAL_TABLET | Freq: Four times a day (QID) | ORAL | Status: DC | PRN
  Administered 2015-03-18 – 2015-03-19 (×4): 1 via ORAL
  Filled 2015-03-18 (×4): qty 1

## 2015-03-18 NOTE — Progress Notes (Addendum)
Patient ID: [name], male   DOB: July 29, 1956, 59 y.o.   MRN: [id number] [facility] PROGRESS NOTE  [name] DPT:[id number] DOB: [birth year] DOA: 03/14/2015 PCP: PROVIDER NOT IN SYSTEM - Follows in [place] center  Brief narrative:    59 y.o. male who follows in [place] center so we do not have much information in EPIC of his previous medical record. Patient with diabetes mellitus type 2, chronic right lower extremity ulcer who presented to [facility] ED with fevers, chills and pain and swelling in B/L LE however left more so than the right LE.   On admission, patient was febrile with tachycardia, tachypnea. No leukocytosis. No evidence of osteomyelitis on x ray studies. MRI of the left foot showed diffuse cellulitis of the left leg and foot without abscess or osteomyelitis. Lower extremity doppler was negative for DVT. Ortho is seeing the patient in consultation and currently no indications for surgical intervention. He was initially on vanco and zosyn.  His hospital course is complicated with ongoing fevers while on IV abx. His blood cultures so far are negative. We added flagyl 03/16/2015 but pt continued to spike fever. ID consulted for input on management and their recommendation was to change abx on 03/17/2015 to Zyvox and Unasyn. He however has still spiked a fever of 102.8 F in past 24 hours.    Assessment/Plan:    Principal Problem: Sepsis due to left lower extremity cellulitis / Leukocytosis / Fever of unknown origin - Sepsis criteria met on admission and continues to meet criteria for sepsis to date with tachycardia, tachypnea and fever. Initially no leukocytosis but he did have WBC count of 10.8 on 03/16/2015. Procalcitonin was elevated at 2.65 and lactic acid 2.01. Suspected source of infection is cellulitis in LLE.   - Pt is hemodynamically stable and does not require pressor support  - Sepsis work up initiated on admission. Vanco and zosyn started on admission and flagyl added 03/16/2015. Pt  however continued to spike fevers although his blood cultures and urine culture are with no significant findings.  - ID has seen the pt in consultation 03/17/2015 and the thought is that the pt may have evolving pyomyositis. Abx were changed to zyvox and Unasyn on 03/17/2015.  - ID also recommended obtaining CT scans which was done 6/24 (abdomen/pelvis/ chest) however no clear source of infection identified at this point.  - Pt to remain in SDU due to ongoing sepsis  Active Problems: Bilateral lower extremity edema with LE ulcers - Ortho has seen the pt in consultation with no recommendations for surgical intervention at this time.  -  WOC consulted, appreciate their assessment. The LE ulcers are not pressure ulcers. He has a chronic venous stasis ulcers bilaterally. Right medial malleolus; medial and lateral left malleolus (R medial: 7cm x 4cm x 0.5cm ; L medial: 6cm x 4cm x 0.2cm; L lateral: 14cm x 4cm x 0.2cm). Right medial ulcer is deeper with moderate exudate and Left medial and lateral ulcers are full thickness. Dressing recommendations: Calcium alginate for exudate management, silver hydrofiber due to chronicity. Right LE wound to be covered with soft silicone foam to accommodate excess exudate. - LE doppler negative for DVT  Diabetes mellitus with peripheral vascular manifestations and neuropathy, uncontrolled - Continue novolog 70/30 mix 20 units BID along with SSI. Metformin on hold due to acute infection  - A1c on this admission 9.8 indicating poor glycemic control  - CBG's in past 24 hours: 132, 127, 152 - Continue gabapentin for neuropathy  Dyslipidemia - Continue statin therapy   Iron deficiency anemia - Continue iron supplementation   BPH - Continue Flomax    DVT Prophylaxis  - Lovenox subQ ordered while pt in hospital    Code Status: Full.  Family Communication: plan of care discussed with the patient Disposition Plan: Continue to monitor in SDU because of fever,  sepsis    IV access:  Peripheral IV  Procedures and diagnostic studies:    Ct Chest W Contrast 03/17/2015  1. Four chamber cardiomegaly with secondary pulmonary lobular interstitial thickening and faint ground-glass opacity suggesting interstitial pulmonary edema. Mild nodal enlargement in the chest could be secondary to passive congestion but is nonspecific. 2. There is subcutaneous edema tracking along the flanks and upper thighs, of uncertain significance. In addition, there is mild adenopathy in the lower pelvis and inguinal regions. Although potentially reactive, into the such as lymphoma are not specifically excluded, and if the palpable Ingle oral abnormality fails to resolve with appropriate therapy, biopsy might be considered. 3. 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 4. Coronary, thoracic aortic, and aortoiliac atherosclerotic calcification. 5. Cervical, thoracic, and lumbar spondylosis with lumbar degenerative disc disease. 6. Multiple duodenal diverticula, none of which appear inflamed. 7.  Prominent stool throughout the colon favors constipation.   Electronically Signed   By: [name] M.D.   On: 03/17/2015 15:38   Ct Abdomen Pelvis W Contrast 03/17/2015    1. Four chamber cardiomegaly with secondary pulmonary lobular interstitial thickening and faint ground-glass opacity suggesting interstitial pulmonary edema. Mild nodal enlargement in the chest could be secondary to passive congestion but is nonspecific. 2. There is subcutaneous edema tracking along the flanks and upper thighs, of uncertain significance. In addition, there is mild adenopathy in the lower pelvis and inguinal regions. Although potentially reactive, into the such as lymphoma are not specifically excluded, and if the palpable Ingle oral abnormality fails to resolve with appropriate therapy, biopsy might be considered. 3. Markedly severe and asymmetric degenerative arthropathy of the  left hip. 4. Coronary, thoracic aortic, and aortoiliac atherosclerotic calcification. 5. Cervical, thoracic, and lumbar spondylosis with lumbar degenerative disc disease. 6. Multiple duodenal diverticula, none of which appear inflamed. 7.  Prominent stool throughout the colon favors constipation.     Dg Chest 2 View 03/14/2015 Low lung volumes with bibasilar opacities favored to represent atelectasis. Cardiac contours upper limits of normal. Electronically Signed By: Lovey Newcomer M.D. On: 03/14/2015 21:07   Dg Tibia/fibula Right 03/14/2015 Soft tissue ulceration along the medial aspect of the distal tibia. No radiographic findings to suggest osteomyelitis. Electronically Signed By: Julian Hy M.D. On: 03/14/2015 21:04   Mr Tibia Fibula Left Wo Contrast 03/16/2015 Diffuse cellulitis of the LEFT leg and foot, without abscess or osteomyelitis. Left-greater-than-right leg swelling with a diameter of the LEFT leg about 1.5 times the RIGHT leg.   Mr Foot Left Wo Contrast 03/16/2015 Diffuse cellulitis of the LEFT leg and foot, without abscess or osteomyelitis. Left-greater-than-right leg swelling with a diameter of the LEFT leg about 1.5 times the RIGHT leg.   Dg Foot Complete Left 03/14/2015 Diffuse swelling of the foot without plain film evidence of osteomyelitis. Electronically Signed By: Dereck Ligas M.D. On: 03/14/2015 21:08   Medical Consultants:  Orthopedic surgery Infectious disease, Dr. Tommy Medal  Other Consultants:  Diabetic coordinator  WOC  IAnti-Infectives:   Vanco 03/14/2015 --> 03/17/2015  Zosyn 03/14/2015 --> 03/17/2015 Flagyl 03/16/2015 --> 03/17/2015 Zyvox 03/17/2015 --> Unasyn 03/17/2015 -->  Leisa Lenz, MD  [facility] Pager [phone]  Time spent in minutes: 25 minutes  If 7PM-7AM, please contact night-coverage [email or web address] Password [facility] 03/18/2015, 11:32 AM   LOS: 4 days    HPI/Subjective: No acute overnight events. Patient  reports his left leg is worse today in regards to pain, swelling.   Objective: Filed Vitals:   03/18/15 0757 03/18/15 0800 03/18/15 1000 03/18/15 1021  BP: 146/56 178/76 152/75   Pulse:  96 98 95  Temp: 99.3 F (37.4 C)     TempSrc: Oral     Resp:  22 23 26   Height:      Weight:      SpO2: 96% 95% 93% 91%    Intake/Output Summary (Last 24 hours) at 03/18/15 1132 Last data filed at 03/18/15 1000  Gross per 24 hour  Intake 1042.5 ml  Output   1000 ml  Net   42.5 ml    Exam:   General:  Pt is alert, not in acute distress  Cardiovascular: tachycardic, S1/S2 (+)  Respiratory: bilateral air entry, no wheezing   Abdomen: non tender, (+) BS  Extremities: Left lower extremity edema now more pronounce compared with RLE, palpable pulses  Neuro: Nonfocal  Data Reviewed: Basic Metabolic Panel:  Recent Labs Lab 03/14/15 1955 03/15/15 0110 03/16/15 1735 03/17/15 0345 03/18/15 0356  NA 140 139 138 141 136  K 4.4 4.3 3.9 3.7 3.8  CL 108 109 107 110 106  CO2 23 24 23 22 23   GLUCOSE 189* 233* 184* 123* 169*  BUN 22* 21* 18 17 15   CREATININE 1.12 1.23 1.02 1.07 1.13  CALCIUM 8.7* 7.7* 7.9* 8.0* 8.0*   Liver Function Tests:  Recent Labs Lab 03/14/15 1955 03/15/15 0110  AST 38 37  ALT 59 52  ALKPHOS 125 131*  BILITOT 0.4 0.5  PROT 7.5 6.5  ALBUMIN 3.4* 2.7*   No results for input(s): LIPASE, AMYLASE in the last 168 hours. No results for input(s): AMMONIA in the last 168 hours. CBC:  Recent Labs Lab 03/14/15 1955 03/15/15 0110 03/16/15 1735 03/17/15 0345 03/18/15 0356  WBC 8.1 8.7 10.8* 10.4 11.7*  NEUTROABS 6.5 6.8  --   --   --   HGB 8.2* 9.0* 8.0* 8.1* 8.2*  HCT 26.3* 29.3* 25.9* 25.4* 25.3*  MCV 66.8* 66.3* 64.3* 64.5* 64.5*  PLT 239 193 187 196 202   Cardiac Enzymes:  Recent Labs Lab 03/15/15 0110  CKTOTAL 602*   BNP: Invalid input(s): POCBNP CBG:  Recent Labs Lab 03/17/15 0743 03/17/15 1127 03/17/15 1543 03/17/15 1947  03/18/15 0745  GLUCAP 97 143* 132* 127* 152*    Urine culture     Status: None   Collection Time: 03/14/15  7:39 PM  Result Value Ref Range Status   Specimen Description URINE, CLEAN CATCH  Final   Special Requests NONE  Final   Culture   Final    MULTIPLE SPECIES PRESENT, SUGGEST RECOLLECTION IF CLINICALLY INDICATED Performed at [facility]    Report Status 03/16/2015 FINAL  Final  Blood culture (routine x 2)     Status: None (Preliminary result)   Collection Time: 03/14/15  7:55 PM  Result Value Ref Range Status   Specimen Description BLOOD RIGHT FOREARM  Final   Culture   Final    NO GROWTH 2 DAYS Performed at [facility]    Report Status PENDING  Incomplete  Blood culture (routine x 2)     Status: None (Preliminary result)  Collection Time: 03/14/15  7:55 PM  Result Value Ref Range Status   Specimen Description BLOOD RIGHT ANTECUBITAL  Final   Culture   Final    NO GROWTH 2 DAYS Performed at [facility]    Report Status PENDING  Incomplete  MRSA PCR Screening     Status: None   Collection Time: 03/15/15  9:28 PM  Result Value Ref Range Status   MRSA by PCR NEGATIVE NEGATIVE Final     Scheduled Meds: . ampicillin-sulbactam (UNASYN) IV  3 g Intravenous Q6H  . antiseptic oral rinse  7 mL Mouth Rinse BID  . aspirin EC  81 mg Oral BH-q7a  . atorvastatin  80 mg Oral Daily  . enoxaparin (LOVENOX) injection  0.5 mg/kg Subcutaneous Q24H  . ferrous sulfate  325 mg Oral TID WC  . gabapentin  300 mg Oral TID  . insulin aspart  0-9 Units Subcutaneous TID WC  . insulin aspart protamine- aspart  20 Units Subcutaneous BID WC  . linezolid  600 mg Oral Q12H  . mirtazapine  15 mg Oral QHS  . oxybutynin  10 mg Oral Daily  . tamsulosin  0.4 mg Oral QPC supper

## 2015-03-18 NOTE — Progress Notes (Signed)
[facility] for [facility]  Date of Admission:  03/14/2015  Antibiotics: linezolid Amp/sulbactam  Subjective: No complaints, remains febrile, painful leg  Objective: Temp:  [99 F (37.2 C)-102.8 F (39.3 C)] 99.3 F (37.4 C) (06/25 0757) Pulse Rate:  [88-103] 95 (06/25 1021) Resp:  [21-27] 26 (06/25 1021) BP: (138-178)/(56-123) 152/75 mmHg (06/25 1000) SpO2:  [91 %-98 %] 91 % (06/25 1021)  General: awake, alert, nad Skin: left leg with erythema, warmth, tenderness, bullae, edema, unchanged Lungs: CTA B Cor: RRR Abdomen: soft, nt, nd  Lab Results Lab Results  Component Value Date   WBC 11.7* 03/18/2015   HGB 8.2* 03/18/2015   HCT 25.3* 03/18/2015   MCV 64.5* 03/18/2015   PLT 202 03/18/2015    Lab Results  Component Value Date   CREATININE 1.13 03/18/2015   BUN 15 03/18/2015   NA 136 03/18/2015   K 3.8 03/18/2015   CL 106 03/18/2015   CO2 23 03/18/2015    Lab Results  Component Value Date   ALT 52 03/15/2015   AST 37 03/15/2015   ALKPHOS 131* 03/15/2015   BILITOT 0.5 03/15/2015      Microbiology: Recent Results (from the past 240 hour(s))  Urine culture     Status: None   Collection Time: 03/14/15  7:39 PM  Result Value Ref Range Status   Specimen Description URINE, CLEAN CATCH  Final   Special Requests NONE  Final   Culture   Final    MULTIPLE SPECIES PRESENT, SUGGEST RECOLLECTION IF CLINICALLY INDICATED Performed at [facility]    Report Status 03/16/2015 FINAL  Final  Blood culture (routine x 2)     Status: None (Preliminary result)   Collection Time: 03/14/15  7:55 PM  Result Value Ref Range Status   Specimen Description BLOOD RIGHT FOREARM  Final   Special Requests BOTTLES DRAWN AEROBIC AND ANAEROBIC 5CC  Final   Culture   Final    NO GROWTH 3 DAYS Performed at [facility]    Report Status PENDING  Incomplete  Blood culture (routine x 2)     Status: None (Preliminary result)   Collection Time: 03/14/15  7:55  PM  Result Value Ref Range Status   Specimen Description BLOOD RIGHT ANTECUBITAL  Final   Special Requests BOTTLES DRAWN AEROBIC AND ANAEROBIC 5ML  Final   Culture   Final    NO GROWTH 3 DAYS Performed at [facility]    Report Status PENDING  Incomplete  MRSA PCR Screening     Status: None   Collection Time: 03/15/15  9:28 PM  Result Value Ref Range Status   MRSA by PCR NEGATIVE NEGATIVE Final    Comment:        The GeneXpert MRSA Assay (FDA approved for NASAL specimens only), is one component of a comprehensive MRSA colonization surveillance program. It is not intended to diagnose MRSA infection nor to guide or monitor treatment for MRSA infections.   Culture, blood (routine x 2)     Status: None (Preliminary result)   Collection Time: 03/16/15  7:24 PM  Result Value Ref Range Status   Specimen Description BLOOD BLOOD LEFT ARM  Final   Special Requests BOTTLES DRAWN AEROBIC AND ANAEROBIC 5ML  Final   Culture   Final    NO GROWTH < 24 HOURS Performed at [facility]    Report Status PENDING  Incomplete  Culture, blood (routine x 2)     Status: None (  Preliminary result)   Collection Time: 03/16/15  8:12 PM  Result Value Ref Range Status   Specimen Description BLOOD BLOOD LEFT WRIST  Final   Special Requests BOTTLES DRAWN AEROBIC AND ANAEROBIC 5ML  Final   Culture   Final    NO GROWTH < 24 HOURS Performed at [facility]    Report Status PENDING  Incomplete  Culture, Urine     Status: None   Collection Time: 03/17/15 11:20 AM  Result Value Ref Range Status   Specimen Description URINE, CLEAN CATCH  Final   Special Requests NONE  Final   Culture   Final    NO GROWTH 1 DAY Performed at [facility]    Report Status 03/18/2015 FINAL  Final    Studies/Results: Ct Chest W Contrast  03/17/2015   CLINICAL DATA:  Chronic right lower extremity ulceration. Fever and chills. Pain. Lower extremity swelling. Fever of unknown origin. Diabetes.   EXAM: CT CHEST, ABDOMEN, AND PELVIS WITH CONTRAST  TECHNIQUE: Multidetector CT imaging of the chest, abdomen and pelvis was performed following the standard protocol during bolus administration of intravenous contrast.  CONTRAST:  137mL OMNIPAQUE IOHEXOL 300 MG/ML  SOLN  COMPARISON:  Multiple exams, including 03/14/2015  FINDINGS: CT CHEST FINDINGS  Mediastinum/Nodes: Borderline prominent right paratracheal lymph nodes. Small bilateral axillary lymph nodes are observed. A right hilar lymph node measures 1.2 cm in short axis, mildly prominent. An AP window lymph node is also mildly prominent at 1.2 cm.  Atherosclerotic calcification of the aortic arch and left anterior descending coronary artery. Mild cardiomegaly. This involves all 4 chambers.  Small bilateral pleural effusions with associated passive atelectasis. Small epicardial lymph nodes are present.  Lungs/Pleura: Secondary pulmonary interstitial lobular thickening at the lung apices. Mild diffuse interstitial accentuation with some faint ground-glass opacities in the lower lobes. No definite nodularity or abnormal enhancement along the pleural margins to indicate specificity for exudative effusion.  Musculoskeletal: Lower cervical and lower thoracic spondylosis.  CT ABDOMEN PELVIS FINDINGS  Hepatobiliary: Small gallstones are visible in the gallbladder neck. No biliary dilatation.  Pancreas: Unremarkable  Spleen: Unremarkable  Adrenals/Urinary Tract: Unremarkable  Stomach/Bowel: Periampullary duodenal diverticula do not appear inflamed. There is a larger transverse duodenal diverticulum measuring 4.5 cm in diameter.  Appendix not well seen. Prominent stool throughout the colon favors constipation.  Vascular/Lymphatic: Aortoiliac atherosclerotic vascular disease. Bilateral prominent inguinal lymph nodes with a left upper inguinal lymph node measuring 1.8 cm in short axis (image 112, series 2,) and a left lower right inguinal lymph node also measuring 1.8 cm in  short axis (image 128, series 2). A right external iliac node measures 1.4 cm in short axis, image 111 of series 2. Small retroperitoneal lymph nodes are present.  Reproductive: Unremarkable  Other: There is subcutaneous edema tracking along both flanks and overlying both hips and extending into the upper thighs, of uncertain significance.  Musculoskeletal: Markedly severe degenerative arthropathy of the left hip. Moderate to severe degenerative arthropathy of the right hip. Lumbar spondylosis and degenerative disc disease noted with disc bulge most notable at the L3-4 level.  IMPRESSION: 1. Four chamber cardiomegaly with secondary pulmonary lobular interstitial thickening and faint ground-glass opacity suggesting interstitial pulmonary edema. Mild nodal enlargement in the chest could be secondary to passive congestion but is nonspecific. 2. There is subcutaneous edema tracking along the flanks and upper thighs, of uncertain significance. In addition, there is mild adenopathy in the lower pelvis and inguinal regions. Although potentially reactive, into the  such as lymphoma are not specifically excluded, and if the palpable Ingle oral abnormality fails to resolve with appropriate therapy, biopsy might be considered. 3. 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 4. Coronary, thoracic aortic, and aortoiliac atherosclerotic calcification. 5. Cervical, thoracic, and lumbar spondylosis with lumbar degenerative disc disease. 6. Multiple duodenal diverticula, none of which appear inflamed. 7.  Prominent stool throughout the colon favors constipation.   Electronically Signed   By: Van Clines M.D.   On: 03/17/2015 15:38   Ct Abdomen Pelvis W Contrast  03/17/2015   CLINICAL DATA:  Chronic right lower extremity ulceration. Fever and chills. Pain. Lower extremity swelling. Fever of unknown origin. Diabetes.  EXAM: CT CHEST, ABDOMEN, AND PELVIS WITH CONTRAST  TECHNIQUE: Multidetector CT  imaging of the chest, abdomen and pelvis was performed following the standard protocol during bolus administration of intravenous contrast.  CONTRAST:  163mL OMNIPAQUE IOHEXOL 300 MG/ML  SOLN  COMPARISON:  Multiple exams, including 03/14/2015  FINDINGS: CT CHEST FINDINGS  Mediastinum/Nodes: Borderline prominent right paratracheal lymph nodes. Small bilateral axillary lymph nodes are observed. A right hilar lymph node measures 1.2 cm in short axis, mildly prominent. An AP window lymph node is also mildly prominent at 1.2 cm.  Atherosclerotic calcification of the aortic arch and left anterior descending coronary artery. Mild cardiomegaly. This involves all 4 chambers.  Small bilateral pleural effusions with associated passive atelectasis. Small epicardial lymph nodes are present.  Lungs/Pleura: Secondary pulmonary interstitial lobular thickening at the lung apices. Mild diffuse interstitial accentuation with some faint ground-glass opacities in the lower lobes. No definite nodularity or abnormal enhancement along the pleural margins to indicate specificity for exudative effusion.  Musculoskeletal: Lower cervical and lower thoracic spondylosis.  CT ABDOMEN PELVIS FINDINGS  Hepatobiliary: Small gallstones are visible in the gallbladder neck. No biliary dilatation.  Pancreas: Unremarkable  Spleen: Unremarkable  Adrenals/Urinary Tract: Unremarkable  Stomach/Bowel: Periampullary duodenal diverticula do not appear inflamed. There is a larger transverse duodenal diverticulum measuring 4.5 cm in diameter.  Appendix not well seen. Prominent stool throughout the colon favors constipation.  Vascular/Lymphatic: Aortoiliac atherosclerotic vascular disease. Bilateral prominent inguinal lymph nodes with a left upper inguinal lymph node measuring 1.8 cm in short axis (image 112, series 2,) and a left lower right inguinal lymph node also measuring 1.8 cm in short axis (image 128, series 2). A right external iliac node measures 1.4 cm  in short axis, image 111 of series 2. Small retroperitoneal lymph nodes are present.  Reproductive: Unremarkable  Other: There is subcutaneous edema tracking along both flanks and overlying both hips and extending into the upper thighs, of uncertain significance.  Musculoskeletal: Markedly severe degenerative arthropathy of the left hip. Moderate to severe degenerative arthropathy of the right hip. Lumbar spondylosis and degenerative disc disease noted with disc bulge most notable at the L3-4 level.  IMPRESSION: 1. Four chamber cardiomegaly with secondary pulmonary lobular interstitial thickening and faint ground-glass opacity suggesting interstitial pulmonary edema. Mild nodal enlargement in the chest could be secondary to passive congestion but is nonspecific. 2. There is subcutaneous edema tracking along the flanks and upper thighs, of uncertain significance. In addition, there is mild adenopathy in the lower pelvis and inguinal regions. Although potentially reactive, into the such as lymphoma are not specifically excluded, and if the palpable Ingle oral abnormality fails to resolve with appropriate therapy, biopsy might be considered. 3. 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 4. Coronary, thoracic aortic, and aortoiliac atherosclerotic calcification.  5. Cervical, thoracic, and lumbar spondylosis with lumbar degenerative disc disease. 6. Multiple duodenal diverticula, none of which appear inflamed. 7.  Prominent stool throughout the colon favors constipation.   Electronically Signed   By: Van Clines M.D.   On: 03/17/2015 15:38    Assessment/Plan:  1) cellulitis - Was on broad spectrum with vancomycin and zosyn, changed to linezolid for ?toxin with Staph and strep coverage and Unasyn.  Does have DFU.   Will continue with antibiotics.   Raise leg (discussed with nursing staff) MRI in 1-2 days to see if evolving and needs surgical intervention  COMER, Herbie [place],  [place] [facility] [place] [email or web address] [phone] pager   [phone] cell 03/18/2015, 12:41 PM

## 2015-03-18 NOTE — Clinical Social Work Note (Signed)
Clinical Social Work Assessment  Patient Details  Name: [name] MRN: [id number] Date of Birth: Jun 09, 1956  Date of referral:  03/18/15               Reason for consult:  Facility Placement                Permission sought to share information with:  Facility Art therapist granted to share information::  Yes, Verbal Permission Granted  Name::        Agency::     Relationship::     Contact Information:     Housing/Transportation Living arrangements for the past 2 months:  Homeless Shelter Source of Information:  Patient Patient Interpreter Needed:    Criminal Activity/Legal Involvement Pertinent to Current Situation/Hospitalization:    Significant Relationships:  Adult Children, Siblings Lives with:  [facility] ([facility]) Do you feel safe going back to the place where you live?    Need for family participation in patient care:  No ([facility])  Care giving concerns:  No caregivers/ Pt lives at [facility] and they are closed on weekends so could not reach   Facilities manager / plan:  CSW met with pt at bedside to discuss discharge needs.  CSW provided an explanation of the SNF/rehab process and prompted pt to discuss his history and needs.  CSW encouraged pt to explore thoughts and feelings related to going to rehab from hospital.  CSW provided active and supportive listening  Employment status:  Unemployed Insurance information:   (medicare part A only) PT Recommendations:  [place] / Referral to community resources:     Patient/Family's Response to care:  Pt stated that he just moved to the [facility] a homeless shelter that serves veterans about a month ago.  Pt stated that he can reside at this facility for 2 years while he works on getting himself stable.  Pt stated that he has no family in [place].  He has 2 brother in [place] and a daughter in [place].  Both his parents are deceased.   Pt states that he did some rehab at the [facility] in [place] In October of 2005. Pt does not want to go to rehab from hospital and wants to go back to the [facility] stating that they do lots of services at the facility.     Patient/Family's Understanding of and Emotional Response to Diagnosis, Current Treatment, and Prognosis:  Pt might not be aware of the help that he needs regarding his daily living skills.  Pt believes that the [facility] can meet his needs.  Pt has no support and stated that there was no one to help him make his decisions.   Emotional Assessment Appearance:  Appears stated age Attitude/Demeanor/Rapport:  Sedated Affect (typically observed):  Accepting Orientation:  Oriented to Self, Oriented to Place, Oriented to  Time, Oriented to Situation Alcohol / Substance use:    Psych involvement (Current and /or in the community):     Discharge Needs  Concerns to be addressed:    Readmission within the last 30 days:  No Current discharge risk:    Barriers to Discharge:   (Pt's does not have insurance and a SNF is being recommened)   Carlean Jews, LCSW 03/18/2015, 4:47 PM

## 2015-03-18 NOTE — Clinical Social Work Note (Signed)
Clinical Social Work Assessment  Patient Details  Name: [name] MRN: [id number] Date of Birth: 03-12-1956  Date of referral:  03/18/15               Reason for consult:  Facility Placement                Permission sought to share information with:  Facility Art therapist granted to share information::  Yes, Verbal Permission Granted  Name::        Agency::     Relationship::     Contact Information:     Housing/Transportation Living arrangements for the past 2 months:  Homeless Shelter Source of Information:  Patient Patient Interpreter Needed:    Criminal Activity/Legal Involvement Pertinent to Current Situation/Hospitalization:    Significant Relationships:  Adult Children, Siblings Lives with:  Other (Comment[facility]) Do you feel safe going back to the place where you live?    Need for family participation in patient care:  No ([facility])  Care giving concerns:  Could not reach the [facility] where pt resides   Facilities manager / plan:  CSW met with pt at bedside to discuss discharge needs.  CSW explained SNF process and encouraged pt to discuss thoughts and feelings related to rehab.  CSW provided active and supportive listening.  CSW will follow up with [facility] to assess for services  Employment status:  Unemployed Insurance information:   (medicare part A only) PT Recommendations:  [place] / Referral to community resources:     Patient/Family's Response to care:  Pt stated that he just started living at the [facility] a month ago and he can stay there for 2 years while he gets himself stable.  Pt stated that he does not have any family in [place].    Patient/Family's Understanding of and Emotional Response to Diagnosis, Current Treatment, and Prognosis:  Pt believes that since he is using his walker to get around he should be able to go back to the [facility] and does not want to go  to rehab.  Pt would like services in his facility.  Pt stated that he did some rehab in 2005 at the [facility] in [place].  Pt believes the [facility] can accommodate him at discharge   Emotional Assessment Appearance:  Appears stated age Attitude/Demeanor/Rapport:  Sedated Affect (typically observed):  Accepting Orientation:  Oriented to Self, Oriented to Place, Oriented to  Time, Oriented to Situation Alcohol / Substance use:    Psych involvement (Current and /or in the community):     Discharge Needs  Concerns to be addressed:    Readmission within the last 30 days:  No Current discharge risk:    Barriers to Discharge:   (Pt's does not have insurance and a SNF is being recommened)   Carlean Jews, LCSW 03/18/2015, 4:55 PM

## 2015-03-19 DIAGNOSIS — M6009 Infective myositis, multiple sites: Secondary | ICD-10-CM

## 2015-03-19 LAB — GLUCOSE, CAPILLARY
GLUCOSE-CAPILLARY: 119 mg/dL — AB (ref 65–99)
GLUCOSE-CAPILLARY: 171 mg/dL — AB (ref 65–99)
Glucose-Capillary: 197 mg/dL — ABNORMAL HIGH (ref 65–99)
Glucose-Capillary: 109 mg/dL — ABNORMAL HIGH (ref 65–99)
Glucose-Capillary: 125 mg/dL — ABNORMAL HIGH (ref 65–99)

## 2015-03-19 LAB — CULTURE, BLOOD (ROUTINE X 2)
Culture: NO GROWTH
Culture: NO GROWTH

## 2015-03-19 LAB — HIV ANTIBODY (ROUTINE TESTING W REFLEX): HIV SCREEN 4TH GENERATION: NONREACTIVE

## 2015-03-19 MED ORDER — ADULT MULTIVITAMIN W/MINERALS CH
1.0000 | ORAL_TABLET | Freq: Every day | ORAL | Status: DC
  Administered 2015-03-19 – 2015-03-24 (×6): 1 via ORAL
  Filled 2015-03-19 (×6): qty 1

## 2015-03-19 MED ORDER — UNJURY CHOCOLATE CLASSIC POWDER
2.0000 [oz_av] | Freq: Three times a day (TID) | ORAL | Status: DC
Start: 2015-03-19 — End: 2015-03-24
  Administered 2015-03-19 – 2015-03-24 (×13): 2 [oz_av] via ORAL
  Filled 2015-03-19 (×17): qty 27

## 2015-03-19 MED ORDER — OXYCODONE-ACETAMINOPHEN 5-325 MG PO TABS
2.0000 | ORAL_TABLET | Freq: Four times a day (QID) | ORAL | Status: DC | PRN
  Administered 2015-03-19 – 2015-03-24 (×14): 2 via ORAL
  Filled 2015-03-19 (×14): qty 2

## 2015-03-19 NOTE — Progress Notes (Signed)
Subjective:     Patient reports pain as 2 on 0-10 scale. Temp.99.  Objective: Vital signs in last 24 hours: Temp:  [99.6 F (37.6 C)-103.1 F (39.5 C)] 99.6 F (37.6 C) (06/26 0800) Pulse Rate:  [92-109] 93 (06/26 0800) Resp:  [16-31] 24 (06/26 0800) BP: (135-178)/(60-100) 178/100 mmHg (06/26 0800) SpO2:  [90 %-97 %] 95 % (06/26 0800)  Intake/Output from previous day: 06/25 0701 - 06/26 0700 In: 1440 [P.O.:800; I.V.:240; IV Piggyback:400] Out: 1175 [Urine:1175] Intake/Output this shift: Total I/O In: 110 [I.V.:10; IV Piggyback:100] Out: -    Recent Labs  03/16/15 1735 03/17/15 0345 03/18/15 0356  HGB 8.0* 8.1* 8.2*    Recent Labs  03/17/15 0345 03/18/15 0356  WBC 10.4 11.7*  RBC 3.94* 3.92*  HCT 25.4* 25.3*  PLT 196 202    Recent Labs  03/17/15 0345 03/18/15 0356  NA 141 136  K 3.7 3.8  CL 110 106  CO2 22 23  BUN 17 15  CREATININE 1.07 1.13  GLUCOSE 123* 169*  CALCIUM 8.0* 8.0*   No results for input(s): LABPT, INR in the last 72 hours.  Left leg still very swollen. plan is to repeat his MRI.   Assessment/Plan:     Continue ABX therapy due to treatment of his cellulitis.  [name] A 03/19/2015, 8:40 AM

## 2015-03-19 NOTE — Progress Notes (Addendum)
[place] for Infectious Disease  Date of Admission:  03/14/2015  Antibiotics: linezolid Amp/sulbactam  Subjective: sleeping, remains febrile  Objective: Temp:  [99.6 F (37.6 C)-103.1 F (39.5 C)] 102.8 F (39.3 C) (06/26 1145) Pulse Rate:  [92-109] 101 (06/26 1200) Resp:  [16-31] 21 (06/26 1200) BP: (138-178)/(60-100) 157/76 mmHg (06/26 1200) SpO2:  [95 %-97 %] 96 % (06/26 1200)  General: awake, alert, nad Skin: left leg with erythema, warmth, tenderness, bullae, edema, unchanged Lungs: CTA B Cor: RRR Abdomen: soft, nt, nd  Lab Results Lab Results  Component Value Date   WBC 11.7* 03/18/2015   HGB 8.2* 03/18/2015   HCT 25.3* 03/18/2015   MCV 64.5* 03/18/2015   PLT 202 03/18/2015    Lab Results  Component Value Date   CREATININE 1.13 03/18/2015   BUN 15 03/18/2015   NA 136 03/18/2015   K 3.8 03/18/2015   CL 106 03/18/2015   CO2 23 03/18/2015    Lab Results  Component Value Date   ALT 52 03/15/2015   AST 37 03/15/2015   ALKPHOS 131* 03/15/2015   BILITOT 0.5 03/15/2015      Microbiology: Recent Results (from the past 240 hour(s))  Urine culture     Status: None   Collection Time: 03/14/15  7:39 PM  Result Value Ref Range Status   Specimen Description URINE, CLEAN CATCH  Final   Special Requests NONE  Final   Culture   Final    MULTIPLE SPECIES PRESENT, SUGGEST RECOLLECTION IF CLINICALLY INDICATED Performed at [facility]    Report Status 03/16/2015 FINAL  Final  Blood culture (routine x 2)     Status: None   Collection Time: 03/14/15  7:55 PM  Result Value Ref Range Status   Specimen Description BLOOD RIGHT FOREARM  Final   Special Requests BOTTLES DRAWN AEROBIC AND ANAEROBIC 5CC  Final   Culture   Final    NO GROWTH 5 DAYS Performed at [facility]    Report Status 03/19/2015 FINAL  Final  Blood culture (routine x 2)     Status: None   Collection Time: 03/14/15  7:55 PM  Result Value Ref Range Status   Specimen  Description BLOOD RIGHT ANTECUBITAL  Final   Special Requests BOTTLES DRAWN AEROBIC AND ANAEROBIC 5ML  Final   Culture   Final    NO GROWTH 5 DAYS Performed at [facility]    Report Status 03/19/2015 FINAL  Final  MRSA PCR Screening     Status: None   Collection Time: 03/15/15  9:28 PM  Result Value Ref Range Status   MRSA by PCR NEGATIVE NEGATIVE Final    Comment:        The GeneXpert MRSA Assay (FDA approved for NASAL specimens only), is one component of a comprehensive MRSA colonization surveillance program. It is not intended to diagnose MRSA infection nor to guide or monitor treatment for MRSA infections.   Culture, blood (routine x 2)     Status: None (Preliminary result)   Collection Time: 03/16/15  7:24 PM  Result Value Ref Range Status   Specimen Description BLOOD BLOOD LEFT ARM  Final   Special Requests BOTTLES DRAWN AEROBIC AND ANAEROBIC 5ML  Final   Culture   Final    NO GROWTH 3 DAYS Performed at [facility]    Report Status PENDING  Incomplete  Culture, blood (routine x 2)     Status: None (Preliminary result)   Collection Time:  03/16/15  8:12 PM  Result Value Ref Range Status   Specimen Description BLOOD BLOOD LEFT WRIST  Final   Special Requests BOTTLES DRAWN AEROBIC AND ANAEROBIC 5ML  Final   Culture   Final    NO GROWTH 3 DAYS Performed at [facility]    Report Status PENDING  Incomplete  Culture, Urine     Status: None   Collection Time: 03/17/15 11:20 AM  Result Value Ref Range Status   Specimen Description URINE, CLEAN CATCH  Final   Special Requests NONE  Final   Culture   Final    NO GROWTH 1 DAY Performed at [facility]    Report Status 03/18/2015 FINAL  Final    Studies/Results: Ct Chest W Contrast  03/17/2015   CLINICAL DATA:  Chronic right lower extremity ulceration. Fever and chills. Pain. Lower extremity swelling. Fever of unknown origin. Diabetes.  EXAM: CT CHEST, ABDOMEN, AND PELVIS WITH CONTRAST   TECHNIQUE: Multidetector CT imaging of the chest, abdomen and pelvis was performed following the standard protocol during bolus administration of intravenous contrast.  CONTRAST:  178mL OMNIPAQUE IOHEXOL 300 MG/ML  SOLN  COMPARISON:  Multiple exams, including 03/14/2015  FINDINGS: CT CHEST FINDINGS  Mediastinum/Nodes: Borderline prominent right paratracheal lymph nodes. Small bilateral axillary lymph nodes are observed. A right hilar lymph node measures 1.2 cm in short axis, mildly prominent. An AP window lymph node is also mildly prominent at 1.2 cm.  Atherosclerotic calcification of the aortic arch and left anterior descending coronary artery. Mild cardiomegaly. This involves all 4 chambers.  Small bilateral pleural effusions with associated passive atelectasis. Small epicardial lymph nodes are present.  Lungs/Pleura: Secondary pulmonary interstitial lobular thickening at the lung apices. Mild diffuse interstitial accentuation with some faint ground-glass opacities in the lower lobes. No definite nodularity or abnormal enhancement along the pleural margins to indicate specificity for exudative effusion.  Musculoskeletal: Lower cervical and lower thoracic spondylosis.  CT ABDOMEN PELVIS FINDINGS  Hepatobiliary: Small gallstones are visible in the gallbladder neck. No biliary dilatation.  Pancreas: Unremarkable  Spleen: Unremarkable  Adrenals/Urinary Tract: Unremarkable  Stomach/Bowel: Periampullary duodenal diverticula do not appear inflamed. There is a larger transverse duodenal diverticulum measuring 4.5 cm in diameter.  Appendix not well seen. Prominent stool throughout the colon favors constipation.  Vascular/Lymphatic: Aortoiliac atherosclerotic vascular disease. Bilateral prominent inguinal lymph nodes with a left upper inguinal lymph node measuring 1.8 cm in short axis (image 112, series 2,) and a left lower right inguinal lymph node also measuring 1.8 cm in short axis (image 128, series 2). A right external  iliac node measures 1.4 cm in short axis, image 111 of series 2. Small retroperitoneal lymph nodes are present.  Reproductive: Unremarkable  Other: There is subcutaneous edema tracking along both flanks and overlying both hips and extending into the upper thighs, of uncertain significance.  Musculoskeletal: Markedly severe degenerative arthropathy of the left hip. Moderate to severe degenerative arthropathy of the right hip. Lumbar spondylosis and degenerative disc disease noted with disc bulge most notable at the L3-4 level.  IMPRESSION: 1. Four chamber cardiomegaly with secondary pulmonary lobular interstitial thickening and faint ground-glass opacity suggesting interstitial pulmonary edema. Mild nodal enlargement in the chest could be secondary to passive congestion but is nonspecific. 2. There is subcutaneous edema tracking along the flanks and upper thighs, of uncertain significance. In addition, there is mild adenopathy in the lower pelvis and inguinal regions. Although potentially reactive, into the such as lymphoma are not specifically excluded,  and if the palpable Ingle oral abnormality fails to resolve with appropriate therapy, biopsy might be considered. 3. 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 4. Coronary, thoracic aortic, and aortoiliac atherosclerotic calcification. 5. Cervical, thoracic, and lumbar spondylosis with lumbar degenerative disc disease. 6. Multiple duodenal diverticula, none of which appear inflamed. 7.  Prominent stool throughout the colon favors constipation.   Electronically Signed   By: Van Clines M.D.   On: 03/17/2015 15:38   Ct Abdomen Pelvis W Contrast  03/17/2015   CLINICAL DATA:  Chronic right lower extremity ulceration. Fever and chills. Pain. Lower extremity swelling. Fever of unknown origin. Diabetes.  EXAM: CT CHEST, ABDOMEN, AND PELVIS WITH CONTRAST  TECHNIQUE: Multidetector CT imaging of the chest, abdomen and pelvis was  performed following the standard protocol during bolus administration of intravenous contrast.  CONTRAST:  126mL OMNIPAQUE IOHEXOL 300 MG/ML  SOLN  COMPARISON:  Multiple exams, including 03/14/2015  FINDINGS: CT CHEST FINDINGS  Mediastinum/Nodes: Borderline prominent right paratracheal lymph nodes. Small bilateral axillary lymph nodes are observed. A right hilar lymph node measures 1.2 cm in short axis, mildly prominent. An AP window lymph node is also mildly prominent at 1.2 cm.  Atherosclerotic calcification of the aortic arch and left anterior descending coronary artery. Mild cardiomegaly. This involves all 4 chambers.  Small bilateral pleural effusions with associated passive atelectasis. Small epicardial lymph nodes are present.  Lungs/Pleura: Secondary pulmonary interstitial lobular thickening at the lung apices. Mild diffuse interstitial accentuation with some faint ground-glass opacities in the lower lobes. No definite nodularity or abnormal enhancement along the pleural margins to indicate specificity for exudative effusion.  Musculoskeletal: Lower cervical and lower thoracic spondylosis.  CT ABDOMEN PELVIS FINDINGS  Hepatobiliary: Small gallstones are visible in the gallbladder neck. No biliary dilatation.  Pancreas: Unremarkable  Spleen: Unremarkable  Adrenals/Urinary Tract: Unremarkable  Stomach/Bowel: Periampullary duodenal diverticula do not appear inflamed. There is a larger transverse duodenal diverticulum measuring 4.5 cm in diameter.  Appendix not well seen. Prominent stool throughout the colon favors constipation.  Vascular/Lymphatic: Aortoiliac atherosclerotic vascular disease. Bilateral prominent inguinal lymph nodes with a left upper inguinal lymph node measuring 1.8 cm in short axis (image 112, series 2,) and a left lower right inguinal lymph node also measuring 1.8 cm in short axis (image 128, series 2). A right external iliac node measures 1.4 cm in short axis, image 111 of series 2. Small  retroperitoneal lymph nodes are present.  Reproductive: Unremarkable  Other: There is subcutaneous edema tracking along both flanks and overlying both hips and extending into the upper thighs, of uncertain significance.  Musculoskeletal: Markedly severe degenerative arthropathy of the left hip. Moderate to severe degenerative arthropathy of the right hip. Lumbar spondylosis and degenerative disc disease noted with disc bulge most notable at the L3-4 level.  IMPRESSION: 1. Four chamber cardiomegaly with secondary pulmonary lobular interstitial thickening and faint ground-glass opacity suggesting interstitial pulmonary edema. Mild nodal enlargement in the chest could be secondary to passive congestion but is nonspecific. 2. There is subcutaneous edema tracking along the flanks and upper thighs, of uncertain significance. In addition, there is mild adenopathy in the lower pelvis and inguinal regions. Although potentially reactive, into the such as lymphoma are not specifically excluded, and if the palpable Ingle oral abnormality fails to resolve with appropriate therapy, biopsy might be considered. 3. 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 4. Coronary, thoracic aortic, and aortoiliac atherosclerotic calcification. 5. Cervical, thoracic, and lumbar spondylosis with  lumbar degenerative disc disease. 6. Multiple duodenal diverticula, none of which appear inflamed. 7.  Prominent stool throughout the colon favors constipation.   Electronically Signed   By: Van Clines M.D.   On: 03/17/2015 15:38    Assessment/Plan:  1) cellulitis - still febrile.  Was on broad spectrum with vancomycin and zosyn, changed to linezolid for ?toxin with Staph and strep coverage and Unasyn.  Does have DFU.   Will continue with antibiotics.   Raise leg  MRI will be done in am, can't do today; will do both legs Orthopedics following Dr. Johnnye Sima tomorrow  Randy Castrejon, Herbie [place], [place] for  Infectious Disease [place] [email or web address] [phone] pager   [phone] cell 03/19/2015, 1:50 PM

## 2015-03-19 NOTE — Progress Notes (Signed)
Initial Nutrition Assessment  DOCUMENTATION CODES:  Obesity unspecified  INTERVENTION:  Multivitamin with minerals daily Unjury TID - each scoop provides 100 kcal and 21g of protein RD to continue to monitor  NUTRITION DIAGNOSIS:  Increased nutrient needs related to wound healing as evidenced by estimated needs.  GOAL:  Patient will meet greater than or equal to 90% of their needs  MONITOR:  PO intake, Supplement acceptance, Labs, Weight trends, Skin, I & O's  REASON FOR ASSESSMENT:  Consult Wound healing  ASSESSMENT: 59 y.o. male who follows in [place] center. Patient has with of diabetes mellitus type 2, chronic right lower extremity ulcer who presented to [facility] ED with fevers, chills and pain and swelling in B/L LE however left more so than the right LE.   Pt denies need for diet education. Pt with no questions at this time. Emphasized the importance of controlling blood sugars to aid in wound healing. Pt is willing to try protein supplements. RD to order. PO intake: 50-100%  Nutrition focused physical exam shows no sign of depletion of muscle mass or body fat.  Labs reviewed: CBGs: 119-197 A1c: 9.8  Height:  Ht Readings from Last 1 Encounters:  03/14/15 6\' 1"  (1.854 m)    Weight:  Wt Readings from Last 1 Encounters:  03/14/15 236 lb (107.049 kg)    Ideal Body Weight:  83.6 kg  Wt Readings from Last 10 Encounters:  03/14/15 236 lb (107.049 kg)    BMI:  Body mass index is 31.14 kg/(m^2).  Estimated Nutritional Needs:  Kcal:  2100-2300  Protein:  100-110g  Fluid:  2.1L/day    Skin:  Wound (see comment) (diabetic ulcer on left leg, stage II sacral ulcer)  Diet Order:  Diet Carb Modified Fluid consistency:: Thin; Room service appropriate?: Yes  EDUCATION NEEDS:  Education needs addressed   Intake/Output Summary (Last 24 hours) at 03/19/15 1358 Last data filed at 03/19/15 1313  Gross per 24 hour  Intake   1350 ml  Output    975 ml  Net    375  ml    Last BM:  PTA  Clayton Bibles, MS, RD, LDN Pager: [phone] After Hours Pager: [phone] b

## 2015-03-19 NOTE — Progress Notes (Addendum)
Patient ID: [name], male   DOB: 06-20-1956, 59 y.o.   MRN: [id number] [facility] PROGRESS NOTE  [name] SVX:[id number] DOB: Jan 23, 1956 DOA: 03/14/2015 PCP: PROVIDER NOT IN SYSTEM - follows in [place] [facility]   Brief narrative:    59 y.o. male who follows in [place] center so we do not have much information in EPIC of his previous medical record. Patient with diabetes mellitus type 2, chronic right lower extremity ulcer who presented to [facility] ED with fevers, chills and pain and swelling in B/L LE however left more so than the right LE.   On admission, patient was febrile with tachycardia, tachypnea. No leukocytosis. No evidence of osteomyelitis on x ray studies. MRI of the left foot showed diffuse cellulitis of the left leg and foot without abscess or osteomyelitis. Lower extremity doppler was negative for DVT. Ortho is seeing the patient in consultation and currently no indications for surgical intervention. He was initially on vanco and zosyn.  His hospital course is complicated with ongoing fevers while on IV abx. His blood cultures so far are negative. We added flagyl 03/16/2015 but pt continued to spike fever. ID consulted for input on management and their recommendation was to change abx on 03/17/2015 to Zyvox and Unasyn. He however is still spiking fevers and in past 24 hours as high as 103.1 F.   Assessment/Plan:    Principal Problem: Sepsis due to left lower extremity cellulitis / suspected MSSA cellulitis / pyomyositis / Leukocytosis / Fever of unknown origin - Sepsis criteria met on admission and pt continues to be septic considering ongoing fevers, tachycardia, tachypnea, leukocytosis. Procalcitonin on admission was elevated at 2.65 and lactic acid 2.01. Suspected source of infection is cellulitis in LLE.  - Sepsis work up initiated on admission. Vanco and zosyn started initially and flagyl was added 03/16/2015. Blood cultures from admission are negative so far - ID has seen the pt in  consultation 03/17/2015 and thought there may be a possibility of evolving pyomyositis. Abx were changed to zyvox and Unasyn on 03/17/2015.  - ID also recommended obtaining CT scans which was done 6/24 (abdomen/pelvis/ chest) but no clear source of infection identified at this point.  - We will obtain MRI of the right and lower extremity as per ID recommendation.  - Continue to monitor in SDU.  Active Problems: Bilateral lower extremity edema with LE ulcers - Ortho has seen the pt in consultation; per ortho will continue abx treatment  - WOC consulted, appreciate their assessment. The LE ulcers are not pressure ulcers. He has a chronic venous stasis ulcers bilaterally. Right medial malleolus; medial and lateral left malleolus (R medial: 7cm x 4cm x 0.5cm ; L medial: 6cm x 4cm x 0.2cm; L lateral: 14cm x 4cm x 0.2cm). Right medial ulcer is deeper with moderate exudate and Left medial and lateral ulcers are full thickness. Dressing recommendations: Calcium alginate for exudate management, silver hydrofiber due to chronicity. Right LE wound to be covered with soft silicone foam to accommodate excess exudate. - LE doppler negative for DVT  Diabetes mellitus with peripheral vascular manifestations and neuropathy, uncontrolled - A1c on this admission 9.8 indicating poor glycemic control  - Continue novolog 70/30 mix 20 units BID along with SSI. Metformin on hold due to acute infection  - CBG's range 119-197 - Continue gabapentin for neuropathy  Dyslipidemia - Continue statin therapy   Iron deficiency anemia - Continue iron supplementation  - Hemoglobin is 8.2  BPH - Continue Flomax  DVT Prophylaxis  - Lovenox subQ ordered while pt in hospital    Code Status: Full.  Family Communication:  plan of care discussed with the patient Disposition Plan: remains in SDU due to sepsis  IV access:  Peripheral IV  Procedures and diagnostic studies:    Ct Chest W Contrast 03/17/2015 1. Four  chamber cardiomegaly with secondary pulmonary lobular interstitial thickening and faint ground-glass opacity suggesting interstitial pulmonary edema. Mild nodal enlargement in the chest could be secondary to passive congestion but is nonspecific. 2. There is subcutaneous edema tracking along the flanks and upper thighs, of uncertain significance. In addition, there is mild adenopathy in the lower pelvis and inguinal regions. Although potentially reactive, into the such as lymphoma are not specifically excluded, and if the palpable Ingle oral abnormality fails to resolve with appropriate therapy, biopsy might be considered. 3. 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 4. Coronary, thoracic aortic, and aortoiliac atherosclerotic calcification. 5. Cervical, thoracic, and lumbar spondylosis with lumbar degenerative disc disease. 6. Multiple duodenal diverticula, none of which appear inflamed. 7. Prominent stool throughout the colon favors constipation. Electronically Signed By: Van Clines M.D. On: 03/17/2015 15:38   Ct Abdomen Pelvis W Contrast 03/17/2015 1. Four chamber cardiomegaly with secondary pulmonary lobular interstitial thickening and faint ground-glass opacity suggesting interstitial pulmonary edema. Mild nodal enlargement in the chest could be secondary to passive congestion but is nonspecific. 2. There is subcutaneous edema tracking along the flanks and upper thighs, of uncertain significance. In addition, there is mild adenopathy in the lower pelvis and inguinal regions. Although potentially reactive, into the such as lymphoma are not specifically excluded, and if the palpable Ingle oral abnormality fails to resolve with appropriate therapy, biopsy might be considered. 3. 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 4. Coronary, thoracic aortic, and aortoiliac atherosclerotic calcification. 5. Cervical, thoracic, and lumbar  spondylosis with lumbar degenerative disc disease. 6. Multiple duodenal diverticula, none of which appear inflamed. 7. Prominent stool throughout the colon favors constipation.   Dg Chest 2 View 03/14/2015 [name] lung volumes with bibasilar opacities favored to represent atelectasis. Cardiac contours upper limits of normal. Electronically Signed By: Lovey Newcomer M.D. On: 03/14/2015 21:07   Dg Tibia/fibula Right 03/14/2015 Soft tissue ulceration along the medial aspect of the distal tibia. No radiographic findings to suggest osteomyelitis. Electronically Signed By: Julian Hy M.D. On: 03/14/2015 21:04   Mr Tibia Fibula Left Wo Contrast 03/16/2015 Diffuse cellulitis of the LEFT leg and foot, without abscess or osteomyelitis. Left-greater-than-right leg swelling with a diameter of the LEFT leg about 1.5 times the RIGHT leg.   Mr Foot Left Wo Contrast 03/16/2015 Diffuse cellulitis of the LEFT leg and foot, without abscess or osteomyelitis. Left-greater-than-right leg swelling with a diameter of the LEFT leg about 1.5 times the RIGHT leg.   Dg Foot Complete Left 03/14/2015 Diffuse swelling of the foot without plain film evidence of osteomyelitis. Electronically Signed By: Dereck Ligas M.D. On: 03/14/2015 21:08   Medical Consultants:  Orthopedic surgery Infectious disease, Dr. Tommy Medal  Other Consultants:  Diabetic coordinator  WOC  IAnti-Infectives:   Vanco 03/14/2015 --> 03/17/2015  Zosyn 03/14/2015 --> 03/17/2015 Flagyl 03/16/2015 --> 03/17/2015 Zyvox 03/17/2015 --> Unasyn 03/17/2015 -->    Leisa Lenz, MD  [facility] Pager [phone]  Time spent in minutes: 25 minutes  If 7PM-7AM, please contact night-coverage [email or web address] Password [email or web address] 03/19/2015, 8:23 AM   LOS: 5 days    HPI/Subjective: No acute overnight events. Patient reports  pain to be relatively controlled.  Objective: Filed Vitals:   03/19/15 0000 03/19/15 0400 03/19/15 0529  03/19/15 0800  BP: 138/60  157/92 178/100  Pulse: 109  92 93  Temp: 103.1 F (39.5 C) 100 F (37.8 C)    TempSrc: Oral Oral    Resp: 30  26 24   Height:      Weight:      SpO2: 97%  95% 95%    Intake/Output Summary (Last 24 hours) at 03/19/15 0823 Last data filed at 03/19/15 0804  Gross per 24 hour  Intake   1440 ml  Output    975 ml  Net    465 ml    Exam:   General:  Pt is alert, follows commands appropriately, not in acute distress  Cardiovascular: Regular rate and rhythm, S1/S2, no murmurs  Respiratory: Clear to auscultation bilaterally, no wheezing, no crackles, no rhonchi  Abdomen: Soft, non tender, non distended, bowel sounds present  Extremities: extensive LLE edema, pulses DP and PT palpable bilaterally  Neuro: Grossly nonfocal  Data Reviewed: Basic Metabolic Panel:  Recent Labs Lab 03/14/15 1955 03/15/15 0110 03/16/15 1735 03/17/15 0345 03/18/15 0356  NA 140 139 138 141 136  K 4.4 4.3 3.9 3.7 3.8  CL 108 109 107 110 106  CO2 23 24 23 22 23   GLUCOSE 189* 233* 184* 123* 169*  BUN 22* 21* 18 17 15   CREATININE 1.12 1.23 1.02 1.07 1.13  CALCIUM 8.7* 7.7* 7.9* 8.0* 8.0*   Liver Function Tests:  Recent Labs Lab 03/14/15 1955 03/15/15 0110  AST 38 37  ALT 59 52  ALKPHOS 125 131*  BILITOT 0.4 0.5  PROT 7.5 6.5  ALBUMIN 3.4* 2.7*   No results for input(s): LIPASE, AMYLASE in the last 168 hours. No results for input(s): AMMONIA in the last 168 hours. CBC:  Recent Labs Lab 03/14/15 1955 03/15/15 0110 03/16/15 1735 03/17/15 0345 03/18/15 0356  WBC 8.1 8.7 10.8* 10.4 11.7*  NEUTROABS 6.5 6.8  --   --   --   HGB 8.2* 9.0* 8.0* 8.1* 8.2*  HCT 26.3* 29.3* 25.9* 25.4* 25.3*  MCV 66.8* 66.3* 64.3* 64.5* 64.5*  PLT 239 193 187 196 202   Cardiac Enzymes:  Recent Labs Lab 03/15/15 0110  CKTOTAL 602*   BNP: Invalid input(s): POCBNP CBG:  Recent Labs Lab 03/17/15 1947 03/18/15 0745 03/18/15 1214 03/18/15 1554 03/18/15 2109   GLUCAP 127* 152* 197* 175* 119*    Recent Results (from the past 240 hour(s))  Urine culture     Status: None   Collection Time: 03/14/15  7:39 PM  Result Value Ref Range Status   Specimen Description URINE, CLEAN CATCH  Final   Special Requests NONE  Final   Culture   Final    MULTIPLE SPECIES PRESENT, SUGGEST RECOLLECTION IF CLINICALLY INDICATED Performed at [facility]    Report Status 03/16/2015 FINAL  Final  Blood culture (routine x 2)     Status: None (Preliminary result)   Collection Time: 03/14/15  7:55 PM  Result Value Ref Range Status   Specimen Description BLOOD RIGHT FOREARM  Final   Special Requests BOTTLES DRAWN AEROBIC AND ANAEROBIC 5CC  Final   Culture   Final    NO GROWTH 4 DAYS Performed at [facility]    Report Status PENDING  Incomplete  Blood culture (routine x 2)     Status: None (Preliminary result)   Collection Time: 03/14/15  7:55 PM  Result  Value Ref Range Status   Specimen Description BLOOD RIGHT ANTECUBITAL  Final   Special Requests BOTTLES DRAWN AEROBIC AND ANAEROBIC 5ML  Final   Culture   Final    NO GROWTH 4 DAYS Performed at [facility]    Report Status PENDING  Incomplete  MRSA PCR Screening     Status: None   Collection Time: 03/15/15  9:28 PM  Result Value Ref Range Status   MRSA by PCR NEGATIVE NEGATIVE Final    Comment:        The GeneXpert MRSA Assay (FDA approved for NASAL specimens only), is one component of a comprehensive MRSA colonization surveillance program. It is not intended to diagnose MRSA infection nor to guide or monitor treatment for MRSA infections.   Culture, blood (routine x 2)     Status: None (Preliminary result)   Collection Time: 03/16/15  7:24 PM  Result Value Ref Range Status   Specimen Description BLOOD BLOOD LEFT ARM  Final   Special Requests BOTTLES DRAWN AEROBIC AND ANAEROBIC 5ML  Final   Culture   Final    NO GROWTH 2 DAYS Performed at [facility]    Report  Status PENDING  Incomplete  Culture, blood (routine x 2)     Status: None (Preliminary result)   Collection Time: 03/16/15  8:12 PM  Result Value Ref Range Status   Specimen Description BLOOD BLOOD LEFT WRIST  Final   Special Requests BOTTLES DRAWN AEROBIC AND ANAEROBIC 5ML  Final   Culture   Final    NO GROWTH 2 DAYS Performed at [facility]    Report Status PENDING  Incomplete  Culture, Urine     Status: None   Collection Time: 03/17/15 11:20 AM  Result Value Ref Range Status   Specimen Description URINE, CLEAN CATCH  Final   Special Requests NONE  Final   Culture   Final    NO GROWTH 1 DAY Performed at [facility]    Report Status 03/18/2015 FINAL  Final     Scheduled Meds: . ampicillin-sulbactam (UNASYN) IV  3 g Intravenous Q6H  . antiseptic oral rinse  7 mL Mouth Rinse BID  . aspirin EC  81 mg Oral BH-[id number]  . atorvastatin  80 mg Oral Daily  . enoxaparin (LOVENOX) injection  0.5 mg/kg Subcutaneous Q24H  . ferrous sulfate  325 mg Oral TID WC  . gabapentin  300 mg Oral TID  . insulin aspart  0-9 Units Subcutaneous TID WC  . insulin aspart protamine- aspart  20 Units Subcutaneous BID WC  . linezolid  600 mg Oral Q12H  . mirtazapine  15 mg Oral QHS  . oxybutynin  10 mg Oral Daily  . tamsulosin  0.4 mg Oral QPC supper   Continuous Infusions:

## 2015-03-19 NOTE — Progress Notes (Signed)
[name] was evaluated by the non invasive vascular lab 03/18/2015 regarding orders for an ABI. Patient was in extreme pain and would not be able to tolerate the cuff pressure required for the exam. Dr. Linus Salmons was notified and it was decided that the exam would be canceled and reordered at a later date if still needed.

## 2015-03-19 NOTE — Progress Notes (Signed)
Patient in 1236 [name] has an order to have both legs MRI'd.  The department called an to inform the nurse the scan would not be done until tomorrow. Dr. Charlies Silvers was notified of the scan being tomorrow.  No other new orders currently. [name] Duerr Roselie Awkward RN

## 2015-03-19 NOTE — Care Management Note (Signed)
Case Management Note  Patient Details  Name: [name] MRN: [id number] Date of Birth: 1956/02/25  Subjective/Objective:                    Action/Plan:  Discharge planning  Expected Discharge Date:   (unknown)               Expected Discharge Plan:     In-House Referral:     Discharge planning Services  CM Consult, Medication Assistance  Post Acute Care Choice:    Choice offered to:     DME Arranged:    DME Agency:     HH Arranged:    HH Agency:     Status of Service:  In process, will continue to follow  Medicare Important Message Given:    Date Medicare IM Given:    Medicare IM give by:    Date Additional Medicare IM Given:    Additional Medicare Important Message give by:     If discussed at [place] of Stay Meetings, dates discussed:    Additional Comments:  CM spoke with patient at the bedside. Patient reports he gets his medications from the [place]. Medications are mailed to him. He sometimes has to wait on hold to determine whether or not it is time for him to refill his prescriptions. Patient states he can call to request his refills from his room after his phone is charged or have a friend call the [place] to request refills. His monthly medication costs is approximately $177 total.  Apolonio Schneiders, RN 03/19/2015, 3:23 PM

## 2015-03-20 ENCOUNTER — Inpatient Hospital Stay (HOSPITAL_COMMUNITY): Payer: Medicare Other

## 2015-03-20 DIAGNOSIS — A4901 Methicillin susceptible Staphylococcus aureus infection, unspecified site: Secondary | ICD-10-CM | POA: Diagnosis present

## 2015-03-20 DIAGNOSIS — D72829 Elevated white blood cell count, unspecified: Secondary | ICD-10-CM | POA: Diagnosis present

## 2015-03-20 DIAGNOSIS — E1351 Other specified diabetes mellitus with diabetic peripheral angiopathy without gangrene: Secondary | ICD-10-CM | POA: Diagnosis present

## 2015-03-20 DIAGNOSIS — L03032 Cellulitis of left toe: Secondary | ICD-10-CM

## 2015-03-20 DIAGNOSIS — E1365 Other specified diabetes mellitus with hyperglycemia: Secondary | ICD-10-CM

## 2015-03-20 DIAGNOSIS — E119 Type 2 diabetes mellitus without complications: Secondary | ICD-10-CM | POA: Diagnosis present

## 2015-03-20 DIAGNOSIS — D509 Iron deficiency anemia, unspecified: Secondary | ICD-10-CM | POA: Diagnosis present

## 2015-03-20 DIAGNOSIS — A419 Sepsis, unspecified organism: Secondary | ICD-10-CM

## 2015-03-20 DIAGNOSIS — L0390 Cellulitis, unspecified: Secondary | ICD-10-CM | POA: Diagnosis present

## 2015-03-20 LAB — GLUCOSE, CAPILLARY
GLUCOSE-CAPILLARY: 178 mg/dL — AB (ref 65–99)
GLUCOSE-CAPILLARY: 183 mg/dL — AB (ref 65–99)
Glucose-Capillary: 150 mg/dL — ABNORMAL HIGH (ref 65–99)
Glucose-Capillary: 181 mg/dL — ABNORMAL HIGH (ref 65–99)

## 2015-03-20 MED ORDER — KETOROLAC TROMETHAMINE 30 MG/ML IJ SOLN
30.0000 mg | Freq: Once | INTRAMUSCULAR | Status: AC
  Administered 2015-03-20: 30 mg via INTRAVENOUS
  Filled 2015-03-20: qty 1

## 2015-03-20 MED ORDER — KETOROLAC TROMETHAMINE 30 MG/ML IJ SOLN
30.0000 mg | Freq: Four times a day (QID) | INTRAMUSCULAR | Status: AC
  Administered 2015-03-20 – 2015-03-23 (×12): 30 mg via INTRAVENOUS
  Filled 2015-03-20 (×12): qty 1

## 2015-03-20 MED ORDER — KETOROLAC TROMETHAMINE 30 MG/ML IJ SOLN: 30.0000 mg | Freq: Four times a day (QID) | INTRAMUSCULAR | Status: DC

## 2015-03-20 MED ORDER — HYDROMORPHONE HCL 2 MG/ML IJ SOLN
2.0000 mg | INTRAMUSCULAR | Status: DC | PRN
  Administered 2015-03-20 – 2015-03-24 (×4): 2 mg via INTRAVENOUS
  Filled 2015-03-20 (×4): qty 1

## 2015-03-20 MED ORDER — SODIUM CHLORIDE 0.9 % IV SOLN: INTRAVENOUS | Status: DC

## 2015-03-20 NOTE — Progress Notes (Signed)
Date:  March 20, 2015 U.R. performed for needs and level of care. Will continue to follow for Case Management needs.  Calley Drenning, RN, BSN, CCM   [phone] 

## 2015-03-20 NOTE — Progress Notes (Signed)
   Subjective: Hospital day - 6 Patient reports pain as moderate.   Patient seen in rounds by Dr. Wynelle Link. Patient is having problems with pain in the left leg and swelling, requiring pain medications  Objective: Vital signs in last 24 hours: Temp:  [99.1 F (37.3 C)-102.8 F (39.3 C)] 99.6 F (37.6 C) (06/27 0800) Pulse Rate:  [77-113] 103 (06/27 0800) Resp:  [12-32] 20 (06/27 0800) BP: (131-169)/(52-103) 161/74 mmHg (06/27 0800) SpO2:  [67 %-98 %] 93 % (06/27 0800)  Intake/Output from previous day:  Intake/Output Summary (Last 24 hours) at 03/20/15 1003 Last data filed at 03/20/15 1000  Gross per 24 hour  Intake   1420 ml  Output   2550 ml  Net  -1130 ml    Intake/Output this shift: Total I/O In: 130 [I.V.:30; IV Piggyback:100] Out: 400 [Urine:400]  Labs:  Recent Labs  03/18/15 0356  HGB 8.2*    Recent Labs  03/18/15 0356  WBC 11.7*  RBC 3.92*  HCT 25.3*  PLT 202    Recent Labs  03/18/15 0356  NA 136  K 3.8  CL 106  CO2 23  BUN 15  CREATININE 1.13  GLUCOSE 169*  CALCIUM 8.0*   No results for input(s): LABPT, INR in the last 72 hours.  EXAM General - Patient is Alert and Appropriate Extremity - continues with moderate swelling in the left leg Motor Function - intact, moving foot and toes well on exam.   Past Medical History  Diagnosis Date  . Diabetes mellitus without complication   . Hypertension   . Chronic hip pain   . GERD (gastroesophageal reflux disease)   . Depression     Assessment/Plan: Hospital day - 6 Principal Problem:   Sepsis Active Problems:   Cellulitis of both lower extremities   Diabetes mellitus type 2, controlled   Microcytic anemia   Pressure ulcer   Secondary diabetes with peripheral vascular disease   Type 1 diabetes mellitus with ketoacidosis, uncontrolled   FUO (fever of unknown origin)   Pyomyositis   Blood poisoning   Cellulitis of left lower extremity  Estimated body mass index is 31.14 kg/(m^2)  as calculated from the following:   Height as of this encounter: 6\' 1"  (1.854 m).   Weight as of this encounter: 107.049 kg (236 lb).  Repeat MRi is pending at this time. Await results  Arlee Muslim, PA-C Orthopaedic Surgery 03/20/2015, 10:03 AM

## 2015-03-20 NOTE — Progress Notes (Signed)
Date:  March 20, 2015 U.R. performed for needs and level of care. Will continue to follow for Case Management needs.  Gergory Biello, RN, BSN, CCM   [phone] 

## 2015-03-20 NOTE — Progress Notes (Addendum)
Patient ID: [name], male   DOB: June 16, 1956, 59 y.o.   MRN: [id number] [facility] PROGRESS NOTE  [name] WEX:[id number] DOB: [birth year] DOA: 03/14/2015 PCP: PROVIDER NOT IN SYSTEM - follows in [place] [facility]   Brief narrative:    59 y.o. male who follows in [place] center so we do not have much information in EPIC of his previous medical record. Patient has history of diabetes mellitus type 2, chronic right lower extremity ulcer who presented to [facility] ED with fevers, chills and pain and swelling in B/L LE however left more so than the right LE.   On admission, patient was febrile with tachycardia, tachypnea. No leukocytosis. No evidence of osteomyelitis on x ray studies. MRI of the left foot showed diffuse cellulitis of the left leg and foot without abscess or osteomyelitis. Lower extremity doppler was negative for DVT. Ortho is seeing the patient in consultation and currently no indications for surgical intervention. He was initially on vanco and zosyn.  His hospital course is complicated with ongoing fevers while on IV abx. His blood cultures so far are negative. We added flagyl 03/16/2015 but pt continued to spike fever. ID consulted for input on management and their recommendation was to change Vanco, Zosyn and Flagyl on 03/17/2015 to Zyvox and Unasyn. Patient continues to spike fever. Repeat MRI of both lower extremities is pending.   Assessment/Plan:    Principal Problem: Sepsis due to left lower extremity cellulitis / suspected MSSA cellulitis / pyomyositis / Leukocytosis / Fever of unknown origin - Sepsis criteria met on admission. Patient continues to be septic with ongoing fevers, tachycardia, tachypnea. He also has a leukocytosis. Procalcitonin on admission was elevated at 2.65 and lactic acid was elevated at 2.01. Suspected source of infection MSSA cellulitis or possibly pyomyositis. Patient had MRI of the left leg on the admission but it was significant for only cellulitis, no  abscess. - As already mentioned, patient was initially on vancomycin and Zosyn. We added Flagyl 03/16/2015 however he continued to spike fevers. His blood cultures from the admission were negative. - We consulted infectious disease on 03/17/2015. They have changed antibodies to Zyvox and Unasyn thinking that patient has possibly evolving pyomyositis. - Again, patient is still spiking fevers. MRI of bilateral lower extremities is pending. - Of note, full workup for fever of unknown origin was done with CT scan of the abdomen, pelvis, chest on 03/17/2015 however no clear source of infection has been identified at this point. - Because of ongoing fevers, sepsis we will continue to monitor patient in step down unit.  Active Problems: Bilateral lower extremity edema with LE ulcers - Ortho has seen the pt in consultation - Orthopedic surgery recommends continuing current abx as recommended by infectious disease. - Patient had lower extremity Doppler of the left lower extremity and this was negative for DVT - WOC consulted, appreciate their assessment. The LE ulcers are not pressure ulcers. He has a chronic venous stasis ulcers bilaterally. Right medial malleolus; medial and lateral left malleolus (R medial: 7cm x 4cm x 0.5cm ; L medial: 6cm x 4cm x 0.2cm; L lateral: 14cm x 4cm x 0.2cm). Right medial ulcer is deeper with moderate exudate and Left medial and lateral ulcers are full thickness. Dressing recommendations: Calcium alginate for exudate management, silver hydrofiber due to chronicity. Right LE wound to be covered with soft silicone foam to accommodate excess exudate.  Diabetes mellitus with peripheral vascular manifestations and neuropathy, uncontrolled - A1c 9.8 indicating poor glycemic control  -  Continue novolog 70/30 mix 20 units BID along with SSI. Metformin on hold due to acute infection  - CBG's range 109 - 178 - Continue gabapentin for neuropathy  Dyslipidemia - Continue statin  therapy   Iron deficiency anemia - Continue iron supplementation  - Hemoglobin is 8.2  BPH - Continue Flomax    DVT Prophylaxis  - Lovenox subQ ordered while pt in hospital    Code Status: Full.  Family Communication:  plan of care discussed with the patient Disposition Plan: remains in SDU due to sepsis  IV access:  Peripheral IV  Procedures and diagnostic studies:    Ct Chest W Contrast 03/17/2015 1. Four chamber cardiomegaly with secondary pulmonary lobular interstitial thickening and faint ground-glass opacity suggesting interstitial pulmonary edema. Mild nodal enlargement in the chest could be secondary to passive congestion but is nonspecific. 2. There is subcutaneous edema tracking along the flanks and upper thighs, of uncertain significance. In addition, there is mild adenopathy in the lower pelvis and inguinal regions. Although potentially reactive, into the such as lymphoma are not specifically excluded, and if the palpable Ingle oral abnormality fails to resolve with appropriate therapy, biopsy might be considered. 3. 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 4. Coronary, thoracic aortic, and aortoiliac atherosclerotic calcification. 5. Cervical, thoracic, and lumbar spondylosis with lumbar degenerative disc disease. 6. Multiple duodenal diverticula, none of which appear inflamed. 7. Prominent stool throughout the colon favors constipation. Electronically Signed By: Van Clines M.D. On: 03/17/2015 15:38   Ct Abdomen Pelvis W Contrast 03/17/2015 1. Four chamber cardiomegaly with secondary pulmonary lobular interstitial thickening and faint ground-glass opacity suggesting interstitial pulmonary edema. Mild nodal enlargement in the chest could be secondary to passive congestion but is nonspecific. 2. There is subcutaneous edema tracking along the flanks and upper thighs, of uncertain significance. In addition, there is mild adenopathy  in the lower pelvis and inguinal regions. Although potentially reactive, into the such as lymphoma are not specifically excluded, and if the palpable Ingle oral abnormality fails to resolve with appropriate therapy, biopsy might be considered. 3. 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 4. Coronary, thoracic aortic, and aortoiliac atherosclerotic calcification. 5. Cervical, thoracic, and lumbar spondylosis with lumbar degenerative disc disease. 6. Multiple duodenal diverticula, none of which appear inflamed. 7. Prominent stool throughout the colon favors constipation.   Dg Chest 2 View 03/14/2015 Low lung volumes with bibasilar opacities favored to represent atelectasis. Cardiac contours upper limits of normal. Electronically Signed By: Lovey Newcomer M.D. On: 03/14/2015 21:07   Dg Tibia/fibula Right 03/14/2015 Soft tissue ulceration along the medial aspect of the distal tibia. No radiographic findings to suggest osteomyelitis. Electronically Signed By: Julian Hy M.D. On: 03/14/2015 21:04   Mr Tibia Fibula Left Wo Contrast 03/16/2015 Diffuse cellulitis of the LEFT leg and foot, without abscess or osteomyelitis. Left-greater-than-right leg swelling with a diameter of the LEFT leg about 1.5 times the RIGHT leg.   Mr Foot Left Wo Contrast 03/16/2015 Diffuse cellulitis of the LEFT leg and foot, without abscess or osteomyelitis. Left-greater-than-right leg swelling with a diameter of the LEFT leg about 1.5 times the RIGHT leg.   Dg Foot Complete Left 03/14/2015 Diffuse swelling of the foot without plain film evidence of osteomyelitis. Electronically Signed By: Dereck Ligas M.D. On: 03/14/2015 21:08   Medical Consultants:  Orthopedic surgery Infectious disease, Dr. Tommy Medal  Other Consultants:  Diabetic coordinator  G[place]  IAnti-Infectives:   Vanco 03/14/2015 --> 03/17/2015  Zosyn 03/14/2015 -->  03/17/2015 Flagyl 03/16/2015 -->  03/17/2015 Zyvox 03/17/2015 --> Unasyn 03/17/2015 -->    Leisa Lenz, MD  [facility] Pager [phone]  Time spent in minutes: 25 minutes  If 7PM-7AM, please contact night-coverage [email or web address] Password [email or web address] 03/20/2015, 10:15 AM   LOS: 6 days    HPI/Subjective: No acute overnight events. Patient reports pain less controlled this morning.  Objective: Filed Vitals:   03/20/15 0400 03/20/15 0500 03/20/15 0600 03/20/15 0800  BP: 131/52   161/74  Pulse: 106 99 106 103  Temp: 100.2 F (37.9 C)   99.6 F (37.6 C)  TempSrc: Oral   Oral  Resp: _0 Height:      Weight:      SpO2: 96% 97% 97% 93%    Intake/Output Summary (Last 24 hours) at 03/20/15 1015 Last data filed at 03/20/15 1000  Gross per 24 hour  Intake   1420 ml  Output   2550 ml  Net  -1130 ml    Exam:   General:  Pt is alert, no acute distress  Cardiovascular: Tachycardia, appreciate S1-S2  Respiratory: Bilateral breath sounds appreciated, no wheezing  Abdomen: Nontender, firm abdomen, appreciate bowel sounds  Extremities: extensive LLE edema, pulses palpable  Neuro: No focal deficits  Data Reviewed: Basic Metabolic Panel:  Recent Labs Lab 03/14/15 1955 03/15/15 0110 03/16/15 1735 03/17/15 0345 03/18/15 0356  NA 140 139 138 141 136  K 4.4 4.3 3.9 3.7 3.8  CL 108 109 107 110 106  CO2 _1 GLUCOSE 189* 233* 184* 123* 169*  BUN 22* 21* _2 CREATININE 1.12 1.23 1.02 1.07 1.13  CALCIUM 8.7* 7.7* 7.9* 8.0* 8.0*   Liver Function Tests:  Recent Labs Lab 03/14/15 1955 03/15/15 0110  AST 38 37  ALT 59 52  ALKPHOS 125 131*  BILITOT 0.4 0.5  PROT 7.5 6.5  ALBUMIN 3.4* 2.7*   No results for input(s): LIPASE, AMYLASE in the last 168 hours. No results for input(s): AMMONIA in the last 168 hours. CBC:  Recent Labs Lab 03/14/15 1955 03/15/15 0110 03/16/15 1735 03/17/15 0345 03/18/15 0356  WBC 8.1 8.7 10.8* 10.4 11.7*  NEUTROABS 6.5 6.8  --   --   --    HGB 8.2* 9.0* 8.0* 8.1* 8.2*  HCT 26.3* 29.3* 25.9* 25.4* 25.3*  MCV 66.8* 66.3* 64.3* 64.5* 64.5*  PLT 239 193 187 196 202   Cardiac Enzymes:  Recent Labs Lab 03/15/15 0110  CKTOTAL 602*   BNP: Invalid input(s): POCBNP CBG:  Recent Labs Lab 03/19/15 0747 03/19/15 1142 03/19/15 1603 03/19/15 2209 03/20/15 0729  GLUCAP 197* 171* 109* 125* 178*    Urine culture     Status: None   Collection Time: 03/14/15  7:39 PM  Result Value Ref Range Status   Specimen Description URINE, CLEAN CATCH  Final   Special Requests NONE  Final   Culture   Final    MULTIPLE SPECIES PRESENT, SUGGEST RECOLLECTION IF CLINICALLY INDICATED Performed at [facility]    Report Status 03/16/2015 FINAL  Final  Blood culture (routine x 2)     Status: None   Collection Time: 03/14/15  7:55 PM  Result Value Ref Range Status   Specimen Description BLOOD RIGHT FOREARM  Final   Special Requests BOTTLES DRAWN AEROBIC AND ANAEROBIC 5CC  Final   Culture   Final    NO GROWTH 5 DAYS Performed at [facility]    Report Status 03/19/2015  FINAL  Final  Blood culture (routine x 2)     Status: None   Collection Time: 03/14/15  7:55 PM  Result Value Ref Range Status   Specimen Description BLOOD RIGHT ANTECUBITAL  Final   Special Requests BOTTLES DRAWN AEROBIC AND ANAEROBIC 5ML  Final   Culture   Final    NO GROWTH 5 DAYS Performed at [facility]    Report Status 03/19/2015 FINAL  Final  MRSA PCR Screening     Status: None   Collection Time: 03/15/15  9:28 PM  Result Value Ref Range Status   MRSA by PCR NEGATIVE NEGATIVE Final  Culture, blood (routine x 2)     Status: None (Preliminary result)   Collection Time: 03/16/15  7:24 PM  Result Value Ref Range Status   Specimen Description BLOOD BLOOD LEFT ARM  Final   Special Requests BOTTLES DRAWN AEROBIC AND ANAEROBIC 5ML  Final   Culture   Final    NO GROWTH 3 DAYS Performed at [facility]    Report Status PENDING   Incomplete  Culture, blood (routine x 2)     Status: None (Preliminary result)   Collection Time: 03/16/15  8:12 PM  Result Value Ref Range Status   Specimen Description BLOOD BLOOD LEFT WRIST  Final   Special Requests BOTTLES DRAWN AEROBIC AND ANAEROBIC 5ML  Final   Culture   Final    NO GROWTH 3 DAYS Performed at [facility]    Report Status PENDING  Incomplete  Culture, Urine     Status: None   Collection Time: 03/17/15 11:20 AM  Result Value Ref Range Status   Specimen Description URINE, CLEAN CATCH  Final   Special Requests NONE  Final   Culture   Final    NO GROWTH 1 DAY Performed at [facility]    Report Status 03/18/2015 FINAL  Final     Scheduled Meds: . ampicillin-sulbactam (UNASYN) IV  3 g Intravenous Q6H  . antiseptic oral rinse  7 mL Mouth Rinse BID  . aspirin EC  81 mg Oral BH-q7a  . atorvastatin  80 mg Oral Daily  . enoxaparin (LOVENOX) injection  0.5 mg/kg Subcutaneous Q24H  . ferrous sulfate  325 mg Oral TID WC  . gabapentin  300 mg Oral TID  . insulin aspart  0-9 Units Subcutaneous TID WC  . insulin aspart protamine- aspart  20 Units Subcutaneous BID WC  . ketorolac  30 mg Intravenous 4 times per day  . linezolid  600 mg Oral Q12H  . mirtazapine  15 mg Oral QHS  . multivitamin with minerals  1 tablet Oral Daily  . oxybutynin  10 mg Oral Daily  . protein supplement  2 oz Oral TID  . tamsulosin  0.4 mg Oral QPC supper   Continuous Infusions:

## 2015-03-20 NOTE — Progress Notes (Signed)
Results for [name], [name] (MRN [id number]) as of 03/20/2015 10:51  Ref. Range 03/19/2015 07:47 03/19/2015 11:42 03/19/2015 16:03 03/19/2015 22:09 03/20/2015 07:29  Glucose-Capillary Latest Ref Range: 65-99 mg/dL 197 (H) 171 (H) 109 (H) 125 (H) 178 (H)  Results for [name], [name] (MRN [id number]) as of 03/20/2015 10:51  Ref. Range 03/16/2015 17:35 03/17/2015 03:45 03/18/2015 03:56  Glucose Latest Ref Range: 65-99 mg/dL 184 (H) 123 (H) 169 (H)   Blood sugars < 200 mg/dL x past 5 days on 70/30 20 units bid. (Interestingly pt on 40 bid at home)  Continue with current insulin regimen. Will follow. Thank you. Lorenda Peck, RD, LDN, CDE Inpatient Diabetes Coordinator [phone]

## 2015-03-20 NOTE — Care Management (Signed)
IM GIVEN TO PATIENT 

## 2015-03-20 NOTE — Progress Notes (Addendum)
Pt returned from MRI °

## 2015-03-20 NOTE — Progress Notes (Signed)
Physical Therapy Treatment Patient Details Name: [name] MRN: [id number] DOB: [birth year] Today's Date: 03/20/2015    History of Present Illness Mr. [name] is a 59 y.o. male with history of diabetes mellitus type 2 chronic right lower extremity ulcer on the leg, degenerative hip presents to the ER 03/14/15  because of fever and chills. Patient states he has been having fever chills over the last 2 days. Patient also over the last 1 week notice increasing swelling in his left lower extremity which is extended proximally. Patient also developed some blisters on his foot on the left side which had denuded to form ulcers. Patient's leg on exam is swollen up to his thigh and tense. Patient has severe pain on trying to move. Patient has sensation and denies any tingling or numbness. Patient's leg is warm to touch up to the foot. Patient also has a chronic ulcer on his right shin. There is no active discharge from this area. X-rays do not reveal any active bony involvement. Patient was febrile and tachycardic and was given fluid bolus. Patient is admitted for sepsis from cellulitis of the lower extremity with concern for compartment syndrome. 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    PT Comments    Patient requires extensive assistance for all mobility. Patient is unable to sit erect enough and control  Trunk balance to attempt standing. Patient requires  External  Total assist to maintain balance.   Follow Up Recommendations  LTACH;SNF (would LTAC be indicated with Bil. leg wounds and significant debility?)     Equipment Recommendations  None recommended by PT    Recommendations for Other Services       Precautions / Restrictions Precautions Precaution Comments: L Leg weeping , una boot on R Restrictions Weight Bearing Restrictions: No    Mobility  Bed Mobility Overal bed mobility: Independent;+2 for physical assistance;+ 2 for safety/equipment Bed Mobility: Rolling Rolling:  Max assist;+2 for physical assistance;+2 for safety/equipment   Supine to sit: Total assist;+2 for physical assistance;+2 for safety/equipment Sit to supine: +2 for physical assistance;+2 for safety/equipment;Total assist   General bed mobility comments: patient requires significant assistance to roll to R with slightly less assist  to roll to the Left, during mobilizing to sitting patient requesting to get to bathroom for BM. Advised patient that he was too weak, returned to supine and anssisted onto bedpan and off of bedpan. Then assisted againt to complete activity with  total assist to get to partial sitting on L side, assist for L LE. patient requires extra time to lift and move Right leg,required  total assist to get legs and trunk back into bed.  Transfers                 General transfer comment: unable to attempt  Ambulation/Gait                 Stairs            Wheelchair Mobility    Modified Rankin (Stroke Patients Only)       Balance Overall balance assessment: Needs assistance Sitting-balance support: Bilateral upper extremity supported;Feet supported Sitting balance-Leahy Scale: Zero Sitting balance - Comments: unable to lean forward far enough to maintain  balance,                            Cognition Arousal/Alertness: Awake/alert Behavior During Therapy: [facility] for tasks assessed/performed  Exercises      General Comments        Pertinent Vitals/Pain Pain Score: 10-Worst pain ever Pain Location: L leg Pain Descriptors / Indicators: Aching;Constant;Crushing;Discomfort;Grimacing Pain Intervention(s): Limited activity within patient's tolerance;Monitored during session;Premedicated before session    Home Living                      Prior Function            PT Goals (current goals can now be found in the care plan section) Progress towards PT goals: Progressing toward goals     Frequency  Min 3X/week    PT Plan Current plan remains appropriate    Co-evaluation             End of Session   Activity Tolerance: Patient limited by pain;Patient limited by fatigue Patient left: in bed;with call bell/phone within reach;with nursing/sitter in room     Time: 0925-1019 PT Time Calculation (min) (ACUTE ONLY): 54 min  Charges:  $Therapeutic Activity: 53-67 mins                    G Codes:      [name] 03/20/2015, 10:57 AM [name] PT [phone]

## 2015-03-20 NOTE — Progress Notes (Addendum)
Patient transported to MRI by transporter without nurse per order.

## 2015-03-20 NOTE — Progress Notes (Signed)
INFECTIOUS DISEASE PROGRESS NOTE  ID: [name] is a 59 y.o. male with  Principal Problem:   Sepsis Active Problems:   FUO (fever of unknown origin)   Pyomyositis   Cellulitis of left lower extremity   DM (diabetes mellitus), secondary, uncontrolled, with peripheral vascular complications   MSSA (methicillin susceptible Staphylococcus aureus) infection   Leukocytosis   Anemia, iron deficiency  Subjective: C/o swelling and pain in LLE  Abtx:  Anti-infectives    Start     Dose/Rate Route Frequency Ordered Stop   03/17/15 1400  Ampicillin-Sulbactam (UNASYN) 3 g in sodium chloride 0.9 % 100 mL IVPB     3 g 100 mL/hr over 60 Minutes Intravenous Every 6 hours 03/17/15 1347     03/17/15 1400  linezolid (ZYVOX) tablet 600 mg     600 mg Oral Every 12 hours 03/17/15 1347     03/16/15 2000  metroNIDAZOLE (FLAGYL) IVPB 500 mg  Status:  Discontinued     500 mg 100 mL/hr over 60 Minutes Intravenous Every 8 hours 03/16/15 1924 03/17/15 1347   03/15/15 1000  vancomycin (VANCOCIN) IVPB 750 mg/150 ml premix  Status:  Discontinued     750 mg 150 mL/hr over 60 Minutes Intravenous Every 12 hours 03/15/15 0106 03/17/15 1347   03/15/15 0400  piperacillin-tazobactam (ZOSYN) IVPB 3.375 g  Status:  Discontinued     3.375 g 12.5 mL/hr over 240 Minutes Intravenous Every 8 hours 03/15/15 0104 03/17/15 1347   03/15/15 0115  vancomycin (VANCOCIN) IVPB 1000 mg/200 mL premix     1,000 mg 200 mL/hr over 60 Minutes Intravenous  Once 03/15/15 0106 03/15/15 0225   03/14/15 2000  vancomycin (VANCOCIN) IVPB 1000 mg/200 mL premix     1,000 mg 200 mL/hr over 60 Minutes Intravenous  Once 03/14/15 1950 03/14/15 2112   03/14/15 2000  piperacillin-tazobactam (ZOSYN) IVPB 3.375 g     3.375 g 100 mL/hr over 30 Minutes Intravenous  Once 03/14/15 1950 03/14/15 2041      Medications:  Scheduled: . ampicillin-sulbactam (UNASYN) IV  3 g Intravenous Q6H  . antiseptic oral rinse  7 mL Mouth Rinse BID  . aspirin EC   81 mg Oral BH-q7a  . atorvastatin  80 mg Oral Daily  . enoxaparin (LOVENOX) injection  0.5 mg/kg Subcutaneous Q24H  . ferrous sulfate  325 mg Oral TID WC  . gabapentin  300 mg Oral TID  . insulin aspart  0-9 Units Subcutaneous TID WC  . insulin aspart protamine- aspart  20 Units Subcutaneous BID WC  . ketorolac  30 mg Intravenous 4 times per day  . linezolid  600 mg Oral Q12H  . mirtazapine  15 mg Oral QHS  . multivitamin with minerals  1 tablet Oral Daily  . oxybutynin  10 mg Oral Daily  . protein supplement  2 oz Oral TID  . tamsulosin  0.4 mg Oral QPC supper    Objective: Vital signs in last 24 hours: Temp:  [97.3 F (36.3 C)-100.9 F (38.3 C)] 97.3 F (36.3 C) (06/27 1200) Pulse Rate:  [75-113] 75 (06/27 1200) Resp:  [12-32] 18 (06/27 1200) BP: (131-169)/(52-103) 133/73 mmHg (06/27 1200) SpO2:  [67 %-99 %] 99 % (06/27 1200)   General appearance: alert, cooperative and no distress Resp: clear to auscultation bilaterally Cardio: regular rate and rhythm GI: normal findings: bowel sounds normal and soft, non-tender Extremities: RLE wrapped. LLE with significant swelling and blistering from calf to knee. his foot and ankle are dressed.  Lab Results  Recent Labs  03/18/15 0356  WBC 11.7*  HGB 8.2*  HCT 25.3*  NA 136  K 3.8  CL 106  CO2 23  BUN 15  CREATININE 1.13   Liver Panel No results for input(s): PROT, ALBUMIN, AST, ALT, ALKPHOS, BILITOT, BILIDIR, IBILI in the last 72 hours. Sedimentation Rate No results for input(s): ESRSEDRATE in the last 72 hours. C-Reactive Protein  Recent Labs  03/18/15 0620 03/18/15 1039  CRP 48.4* >48.8*    Microbiology: Recent Results (from the past 240 hour(s))  Urine culture     Status: None   Collection Time: 03/14/15  7:39 PM  Result Value Ref Range Status   Specimen Description URINE, CLEAN CATCH  Final   Special Requests NONE  Final   Culture   Final    MULTIPLE SPECIES PRESENT, SUGGEST RECOLLECTION IF  CLINICALLY INDICATED Performed at [facility]    Report Status 03/16/2015 FINAL  Final  Blood culture (routine x 2)     Status: None   Collection Time: 03/14/15  7:55 PM  Result Value Ref Range Status   Specimen Description BLOOD RIGHT FOREARM  Final   Special Requests BOTTLES DRAWN AEROBIC AND ANAEROBIC 5CC  Final   Culture   Final    NO GROWTH 5 DAYS Performed at [facility]    Report Status 03/19/2015 FINAL  Final  Blood culture (routine x 2)     Status: None   Collection Time: 03/14/15  7:55 PM  Result Value Ref Range Status   Specimen Description BLOOD RIGHT ANTECUBITAL  Final   Special Requests BOTTLES DRAWN AEROBIC AND ANAEROBIC 5ML  Final   Culture   Final    NO GROWTH 5 DAYS Performed at [facility]    Report Status 03/19/2015 FINAL  Final  MRSA PCR Screening     Status: None   Collection Time: 03/15/15  9:28 PM  Result Value Ref Range Status   MRSA by PCR NEGATIVE NEGATIVE Final    Comment:        The GeneXpert MRSA Assay (FDA approved for NASAL specimens only), is one component of a comprehensive MRSA colonization surveillance program. It is not intended to diagnose MRSA infection nor to guide or monitor treatment for MRSA infections.   Culture, blood (routine x 2)     Status: None (Preliminary result)   Collection Time: 03/16/15  7:24 PM  Result Value Ref Range Status   Specimen Description BLOOD BLOOD LEFT ARM  Final   Special Requests BOTTLES DRAWN AEROBIC AND ANAEROBIC 5ML  Final   Culture   Final    NO GROWTH 3 DAYS Performed at [facility]    Report Status PENDING  Incomplete  Culture, blood (routine x 2)     Status: None (Preliminary result)   Collection Time: 03/16/15  8:12 PM  Result Value Ref Range Status   Specimen Description BLOOD BLOOD LEFT WRIST  Final   Special Requests BOTTLES DRAWN AEROBIC AND ANAEROBIC 5ML  Final   Culture   Final    NO GROWTH 3 DAYS Performed at [facility]    Report  Status PENDING  Incomplete  Culture, Urine     Status: None   Collection Time: 03/17/15 11:20 AM  Result Value Ref Range Status   Specimen Description URINE, CLEAN CATCH  Final   Special Requests NONE  Final   Culture   Final    NO GROWTH 1 DAY Performed at [facility]    Report Status 03/18/2015 FINAL  Final    Studies/Results: No results found.   Assessment/Plan: DM2, poorly controlled with complications Diabetic Foot ulcer, chronic venous stasis ulcers Sepsis Cellulitis LLE ? Pyomyositis Fever  Total days of antibiotics: 6  6-24 unasyn/zyvox  Has defervesced Awaiting repeat MRI of LLE Appreciate wound care and ortho f/u PT when pain improved, MRI done.          [name] Infectious Diseases (pager) [phone] [email or web address] 03/20/2015, 3:21 PM  LOS: 6 days

## 2015-03-21 DIAGNOSIS — E1165 Type 2 diabetes mellitus with hyperglycemia: Secondary | ICD-10-CM

## 2015-03-21 DIAGNOSIS — I83008 Varicose veins of unspecified lower extremity with ulcer other part of lower leg: Secondary | ICD-10-CM

## 2015-03-21 DIAGNOSIS — B9689 Other specified bacterial agents as the cause of diseases classified elsewhere: Secondary | ICD-10-CM

## 2015-03-21 DIAGNOSIS — E11621 Type 2 diabetes mellitus with foot ulcer: Secondary | ICD-10-CM

## 2015-03-21 DIAGNOSIS — L97529 Non-pressure chronic ulcer of other part of left foot with unspecified severity: Secondary | ICD-10-CM

## 2015-03-21 DIAGNOSIS — L97811 Non-pressure chronic ulcer of other part of right lower leg limited to breakdown of skin: Secondary | ICD-10-CM

## 2015-03-21 DIAGNOSIS — L97821 Non-pressure chronic ulcer of other part of left lower leg limited to breakdown of skin: Secondary | ICD-10-CM

## 2015-03-21 LAB — GLUCOSE, CAPILLARY
GLUCOSE-CAPILLARY: 247 mg/dL — AB (ref 65–99)
Glucose-Capillary: 183 mg/dL — ABNORMAL HIGH (ref 65–99)
Glucose-Capillary: 166 mg/dL — ABNORMAL HIGH (ref 65–99)
Glucose-Capillary: 173 mg/dL — ABNORMAL HIGH (ref 65–99)

## 2015-03-21 LAB — CULTURE, BLOOD (ROUTINE X 2)
Culture: NO GROWTH
Culture: NO GROWTH

## 2015-03-21 MED ORDER — HYDRALAZINE HCL 10 MG PO TABS
10.0000 mg | ORAL_TABLET | Freq: Three times a day (TID) | ORAL | Status: DC
  Administered 2015-03-21 – 2015-03-23 (×6): 10 mg via ORAL
  Filled 2015-03-21 (×6): qty 1

## 2015-03-21 NOTE — Progress Notes (Addendum)
Patient ID: [name], male   DOB: 11-Dec-1955, 59 y.o.   MRN: [id number] [facility] PROGRESS NOTE  [name] VCB:[id number] DOB: [birth year] DOA: 03/14/2015 PCP: PROVIDER NOT IN SYSTEM - follows in [place] [facility]   Brief narrative:    59 y.o. male who follows in [place] center so we do not have much information in EPIC of his previous medical record. Patient with diabetes mellitus type 2, chronic right lower extremity ulcer who presented to [facility] ED with fevers, chills and pain and swelling in B/L LE however left more so than the right LE.   On admission, patient was febrile with tachycardia, tachypnea. No leukocytosis. No evidence of osteomyelitis on x ray studies. MRI of the left foot showed diffuse cellulitis of the left leg and foot without abscess or osteomyelitis. Lower extremity doppler was negative for DVT. Ortho is seeing the patient in consultation and currently no indications for surgical intervention. He was initially on vanco and zosyn.  His hospital course is complicated with ongoing fevers while on IV abx. His blood cultures so far are negative. We added flagyl 03/16/2015 but pt continued to spike fever. ID consulted for input on management and their recommendation was to change Vanco, Zosyn and Flagyl on 03/17/2015 to Zyvox and Unasyn. Patient continued to spike fever until last 24 hours. Repeat MRI of both lower extremities showed cellulitis but no abscess.    Assessment/Plan:    Principal Problem: Sepsis due to bilateral lower extremity cellulitis / suspected MSSA cellulitis / pyomyositis / Leukocytosis / Fever of unknown origin - Sepsis criteria met on admission. Procalcitonin on admission was elevated at 2.65 and lactic acid was elevated at 2.01. Suspected source of infection MSSA cellulitis or possibly pyomyositis. MRI of the left leg on the admission was significant for cellulitis, no abscess.  - Patient continued to be septic with ongoing fevers, tachycardia, tachypnea,  leukocytosis. Workup for fever of unknown origin was done with CT scan of the abdomen, pelvis, chest on 03/17/2015 but there was no clear source of infection identified.  - Fortunately, pt did defervesce in past 24 hours.  - Please note, patient was initially on Vancomycin and Zosyn. We added Flagyl 03/16/2015 however he continued to spike fevers. His blood cultures from the admission were negative. We then consulted infectious disease on 03/17/2015. They have changed antibodies to Zyvox and Unasyn thinking that patient has possibly evolving pyomyositis. Their recommendation was to repeat MRI but include both legs. MRI bilateral LE done 03/21/2015 with evidence of cellulitis but no abscess.   Active Problems: Bilateral lower extremity edema with LE ulcers - Ortho has seen the pt in consultation - Orthopedic surgery recommends continuing current antibiotics. - Patient had lower extremity Doppler of the left lower extremity and this was negative for DVT - WOC consulted, appreciate their assessment. The LE ulcers are not pressure ulcers. He has a chronic venous stasis ulcers bilaterally. Right medial malleolus; medial and lateral left malleolus (R medial: 7cm x 4cm x 0.5cm ; L medial: 6cm x 4cm x 0.2cm; L lateral: 14cm x 4cm x 0.2cm). Right medial ulcer is deeper with moderate exudate and Left medial and lateral ulcers are full thickness. Dressing recommendations: Calcium alginate for exudate management, silver hydrofiber due to chronicity. Right LE wound to be covered with soft silicone foam to accommodate excess exudate.  Diabetes mellitus with peripheral vascular manifestations and neuropathy, uncontrolled - A1c 9.8 indicating poor glycemic control  - Continue novolog 70/30 mix 20 units BID along with SSI.  Metformin on hold due to acute infection  - Pt seen by diabetic coordinator on this admission  - Continue gabapentin for neuropathy  Essential hypertension - New diagnosis with SBP in 180's  range which could be due to sepsis, pain - Will add hydralazine 10 mg Q 8 hours   Dyslipidemia - Continue statin therapy   Iron deficiency anemia - Continue iron supplementation  - Hemoglobin is 8.2  BPH - Continue Flomax    DVT Prophylaxis  - Lovenox subQ ordered while pt in hospital    Code Status: Full.  Family Communication:  plan of care discussed with the patient Disposition Plan: remains in SDU due to sepsis but if he remains afebrile in next 24 hours, will transfer to the floor tomorrow; due to the extent of cellulitis, i would prefer to keep this gentleman in hospital. He has just been fever free in last 24 hours and i do not feel comfortable transferring him to another facility at this time.  IV access:  Peripheral IV  Procedures and diagnostic studies:    Mr Tibia Fibula Right Wo Contrast 03/21/2015  LEFT-greater-than- RIGHT leg diffuse subcutaneous edema with mild muscular edema. No abscess. No osteomyelitis.   Electronically Signed   By: Dereck Ligas M.D.   On: 03/21/2015 08:27   Mr Foot Right Wo Contrast 03/21/2015  Negative for osteomyelitis or soft tissue abscess. Diffuse edema of the distal RIGHT leg and ankle which may represent dependent edema or cellulitis in the appropriate clinical setting.   Electronically Signed   By: Dereck Ligas M.D.   On: 03/21/2015 08:33   Mr Tibia Fibula Left Wo Contrast 03/21/2015  Intense subcutaneous edema of the left lower leg most consistent with cellulitis. Negative for abscess or osteomyelitis.   Electronically Signed   By: Inge Rise M.D.   On: 03/21/2015 08:18   Mr Foot Left Wo Contrast 03/21/2015   ntense subcutaneous edema over the foot compatible with cellulitis and/or dependent change. Negative for osteomyelitis or abscess.   Electronically Signed   By: Inge Rise M.D.   On: 03/21/2015 08:26   Ct Chest W Contrast 03/17/2015 1. Four chamber cardiomegaly with secondary pulmonary lobular interstitial thickening  and faint ground-glass opacity suggesting interstitial pulmonary edema. Mild nodal enlargement in the chest could be secondary to passive congestion but is nonspecific. 2. There is subcutaneous edema tracking along the flanks and upper thighs, of uncertain significance. In addition, there is mild adenopathy in the lower pelvis and inguinal regions. Although potentially reactive, into the such as lymphoma are not specifically excluded, and if the palpable Ingle oral abnormality fails to resolve with appropriate therapy, biopsy might be considered. 3. 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 4. Coronary, thoracic aortic, and aortoiliac atherosclerotic calcification. 5. Cervical, thoracic, and lumbar spondylosis with lumbar degenerative disc disease. 6. Multiple duodenal diverticula, none of which appear inflamed. 7. Prominent stool throughout the colon favors constipation. Electronically Signed By: Van Clines M.D. On: 03/17/2015 15:38   Ct Abdomen Pelvis W Contrast 03/17/2015 1. Four chamber cardiomegaly with secondary pulmonary lobular interstitial thickening and faint ground-glass opacity suggesting interstitial pulmonary edema. Mild nodal enlargement in the chest could be secondary to passive congestion but is nonspecific. 2. There is subcutaneous edema tracking along the flanks and upper thighs, of uncertain significance. In addition, there is mild adenopathy in the lower pelvis and inguinal regions. Although potentially reactive, into the such as lymphoma are not specifically excluded, and if the palpable  Ingle oral abnormality fails to resolve with appropriate therapy, biopsy might be considered. 3. 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 4. Coronary, thoracic aortic, and aortoiliac atherosclerotic calcification. 5. Cervical, thoracic, and lumbar spondylosis with lumbar degenerative disc disease. 6. Multiple duodenal diverticula,  none of which appear inflamed. 7. Prominent stool throughout the colon favors constipation.   Dg Chest 2 View 03/14/2015 Low lung volumes with bibasilar opacities favored to represent atelectasis. Cardiac contours upper limits of normal. Electronically Signed By: Lovey Newcomer M.D. On: 03/14/2015 21:07   Dg Tibia/fibula Right 03/14/2015 Soft tissue ulceration along the medial aspect of the distal tibia. No radiographic findings to suggest osteomyelitis. Electronically Signed By: Julian Hy M.D. On: 03/14/2015 21:04   Mr Tibia Fibula Left Wo Contrast 03/16/2015 Diffuse cellulitis of the LEFT leg and foot, without abscess or osteomyelitis. Left-greater-than-right leg swelling with a diameter of the LEFT leg about 1.5 times the RIGHT leg.   Mr Foot Left Wo Contrast 03/16/2015 Diffuse cellulitis of the LEFT leg and foot, without abscess or osteomyelitis. Left-greater-than-right leg swelling with a diameter of the LEFT leg about 1.5 times the RIGHT leg.   Dg Foot Complete Left 03/14/2015 Diffuse swelling of the foot without plain film evidence of osteomyelitis. Electronically Signed By: Dereck Ligas M.D. On: 03/14/2015 21:08   Medical Consultants:  Orthopedic surgery Infectious disease, Dr. Tommy Medal  Other Consultants:  Diabetic coordinator  WOC  IAnti-Infectives:   Vanco 03/14/2015 --> 03/17/2015  Zosyn 03/14/2015 --> 03/17/2015 Flagyl 03/16/2015 --> 03/17/2015 Zyvox 03/17/2015 --> Unasyn 03/17/2015 -->    Leisa Lenz, MD  [facility] Pager [phone]  Time spent in minutes: 25 minutes  If 7PM-7AM, please contact night-coverage [email or web address] Password [email or web address] 03/21/2015, 2:12 PM   LOS: 7 days    HPI/Subjective: No acute overnight events. Patient reports feeling little better this am.  Objective: Filed Vitals:   03/21/15 0400 03/21/15 0800 03/21/15 1149 03/21/15 1200  BP: 139/71 161/75  185/91  Pulse: 84 81  84  Temp: 98.7 F (37.1 C) 98.4 F  (36.9 C) 98.8 F (37.1 C)   TempSrc: Oral Oral Oral   Resp: _0 Height:      Weight:      SpO2: 100% 98%  96%    Intake/Output Summary (Last 24 hours) at 03/21/15 1412 Last data filed at 03/21/15 1341  Gross per 24 hour  Intake    955 ml  Output   1150 ml  Net   -195 ml    Exam:   General:  Pt is alert, awake, no acute distress  Cardiovascular: RRR, (+) S1,S2  Respiratory: no wheezing, clear to auscultation bilaterally   Abdomen: Nontender, (+) bowel sounds   Extremities: LLE edema worse than RLE edema, pulses palpable  Neuro: Non focal   Data Reviewed: Basic Metabolic Panel:  Recent Labs Lab 03/14/15 1955 03/15/15 0110 03/16/15 1735 03/17/15 0345 03/18/15 0356  NA 140 139 138 141 136  K 4.4 4.3 3.9 3.7 3.8  CL 108 109 107 110 106  CO2 _1 GLUCOSE 189* 233* 184* 123* 169*  BUN 22* 21* _2 CREATININE 1.12 1.23 1.02 1.07 1.13  CALCIUM 8.7* 7.7* 7.9* 8.0* 8.0*   Liver Function Tests:  Recent Labs Lab 03/14/15 1955 03/15/15 0110  AST 38 37  ALT 59 52  ALKPHOS 125 131*  BILITOT 0.4 0.5  PROT 7.5 6.5  ALBUMIN 3.4* 2.7*   No results for  input(s): LIPASE, AMYLASE in the last 168 hours. No results for input(s): AMMONIA in the last 168 hours. CBC:  Recent Labs Lab 03/14/15 1955 03/15/15 0110 03/16/15 1735 03/17/15 0345 03/18/15 0356  WBC 8.1 8.7 10.8* 10.4 11.7*  NEUTROABS 6.5 6.8  --   --   --   HGB 8.2* 9.0* 8.0* 8.1* 8.2*  HCT 26.3* 29.3* 25.9* 25.4* 25.3*  MCV 66.8* 66.3* 64.3* 64.5* 64.5*  PLT 239 193 187 196 202   Cardiac Enzymes:  Recent Labs Lab 03/15/15 0110  CKTOTAL 602*   BNP: Invalid input(s): POCBNP CBG:  Recent Labs Lab 03/20/15 1207 03/20/15 1537 03/20/15 1700 03/21/15 0752 03/21/15 1131  GLUCAP 150* 183* 181* 247* 183*    Urine culture     Status: None   Collection Time: 03/14/15  7:39 PM  Result Value Ref Range Status   Specimen Description URINE, CLEAN CATCH  Final   Special  Requests NONE  Final   Culture   Final    MULTIPLE SPECIES PRESENT, SUGGEST RECOLLECTION IF CLINICALLY INDICATED Performed at [facility]    Report Status 03/16/2015 FINAL  Final  Blood culture (routine x 2)     Status: None   Collection Time: 03/14/15  7:55 PM  Result Value Ref Range Status   Specimen Description BLOOD RIGHT FOREARM  Final   Special Requests BOTTLES DRAWN AEROBIC AND ANAEROBIC 5CC  Final   Culture   Final    NO GROWTH 5 DAYS Performed at [facility]    Report Status 03/19/2015 FINAL  Final  Blood culture (routine x 2)     Status: None   Collection Time: 03/14/15  7:55 PM  Result Value Ref Range Status   Specimen Description BLOOD RIGHT ANTECUBITAL  Final   Special Requests BOTTLES DRAWN AEROBIC AND ANAEROBIC 5ML  Final   Culture   Final    NO GROWTH 5 DAYS Performed at [facility]    Report Status 03/19/2015 FINAL  Final  MRSA PCR Screening     Status: None   Collection Time: 03/15/15  9:28 PM  Result Value Ref Range Status   MRSA by PCR NEGATIVE NEGATIVE Final  Culture, blood (routine x 2)     Status: None (Preliminary result)   Collection Time: 03/16/15  7:24 PM  Result Value Ref Range Status   Specimen Description BLOOD BLOOD LEFT ARM  Final   Special Requests BOTTLES DRAWN AEROBIC AND ANAEROBIC 5ML  Final   Culture   Final    NO GROWTH 3 DAYS Performed at [facility]    Report Status PENDING  Incomplete  Culture, blood (routine x 2)     Status: None (Preliminary result)   Collection Time: 03/16/15  8:12 PM  Result Value Ref Range Status   Specimen Description BLOOD BLOOD LEFT WRIST  Final   Special Requests BOTTLES DRAWN AEROBIC AND ANAEROBIC 5ML  Final   Culture   Final    NO GROWTH 3 DAYS Performed at [facility]    Report Status PENDING  Incomplete  Culture, Urine     Status: None   Collection Time: 03/17/15 11:20 AM  Result Value Ref Range Status   Specimen Description URINE, CLEAN CATCH  Final    Special Requests NONE  Final   Culture   Final    NO GROWTH 1 DAY Performed at [facility]    Report Status 03/18/2015 FINAL  Final     Scheduled Meds: .  ampicillin-sulbactam (UNASYN) IV  3 g Intravenous Q6H  . antiseptic oral rinse  7 mL Mouth Rinse BID  . aspirin EC  81 mg Oral BH-q7a  . atorvastatin  80 mg Oral Daily  . enoxaparin (LOVENOX) injection  0.5 mg/kg Subcutaneous Q24H  . ferrous sulfate  325 mg Oral TID WC  . gabapentin  300 mg Oral TID  . hydrALAZINE  10 mg Oral 3 times per day  . insulin aspart  0-9 Units Subcutaneous TID WC  . insulin aspart protamine- aspart  20 Units Subcutaneous BID WC  . ketorolac  30 mg Intravenous 4 times per day  . linezolid  600 mg Oral Q12H  . mirtazapine  15 mg Oral QHS  . multivitamin with minerals  1 tablet Oral Daily  . oxybutynin  10 mg Oral Daily  . protein supplement  2 oz Oral TID  . tamsulosin  0.4 mg Oral QPC supper

## 2015-03-21 NOTE — Progress Notes (Signed)
INFECTIOUS DISEASE PROGRESS NOTE  ID: [name] is a 59 y.o. male with  Principal Problem:   Sepsis Active Problems:   FUO (fever of unknown origin)   Pyomyositis   Cellulitis of left lower extremity   DM (diabetes mellitus), secondary, uncontrolled, with peripheral vascular complications   MSSA (methicillin susceptible Staphylococcus aureus) infection   Leukocytosis   Anemia, iron deficiency   Cellulitis  Subjective: C/o leg pain  Abtx:  Anti-infectives    Start     Dose/Rate Route Frequency Ordered Stop   03/17/15 1400  Ampicillin-Sulbactam (UNASYN) 3 g in sodium chloride 0.9 % 100 mL IVPB     3 g 100 mL/hr over 60 Minutes Intravenous Every 6 hours 03/17/15 1347     03/17/15 1400  linezolid (ZYVOX) tablet 600 mg     600 mg Oral Every 12 hours 03/17/15 1347     03/16/15 2000  metroNIDAZOLE (FLAGYL) IVPB 500 mg  Status:  Discontinued     500 mg 100 mL/hr over 60 Minutes Intravenous Every 8 hours 03/16/15 1924 03/17/15 1347   03/15/15 1000  vancomycin (VANCOCIN) IVPB 750 mg/150 ml premix  Status:  Discontinued     750 mg 150 mL/hr over 60 Minutes Intravenous Every 12 hours 03/15/15 0106 03/17/15 1347   03/15/15 0400  piperacillin-tazobactam (ZOSYN) IVPB 3.375 g  Status:  Discontinued     3.375 g 12.5 mL/hr over 240 Minutes Intravenous Every 8 hours 03/15/15 0104 03/17/15 1347   03/15/15 0115  vancomycin (VANCOCIN) IVPB 1000 mg/200 mL premix     1,000 mg 200 mL/hr over 60 Minutes Intravenous  Once 03/15/15 0106 03/15/15 0225   03/14/15 2000  vancomycin (VANCOCIN) IVPB 1000 mg/200 mL premix     1,000 mg 200 mL/hr over 60 Minutes Intravenous  Once 03/14/15 1950 03/14/15 2112   03/14/15 2000  piperacillin-tazobactam (ZOSYN) IVPB 3.375 g     3.375 g 100 mL/hr over 30 Minutes Intravenous  Once 03/14/15 1950 03/14/15 2041      Medications:  Scheduled: . ampicillin-sulbactam (UNASYN) IV  3 g Intravenous Q6H  . antiseptic oral rinse  7 mL Mouth Rinse BID  . aspirin EC   81 mg Oral BH-[id number]  . atorvastatin  80 mg Oral Daily  . enoxaparin (LOVENOX) injection  0.5 mg/kg Subcutaneous Q24H  . ferrous sulfate  325 mg Oral TID WC  . gabapentin  300 mg Oral TID  . hydrALAZINE  10 mg Oral 3 times per day  . insulin aspart  0-9 Units Subcutaneous TID WC  . insulin aspart protamine- aspart  20 Units Subcutaneous BID WC  . ketorolac  30 mg Intravenous 4 times per day  . linezolid  600 mg Oral Q12H  . mirtazapine  15 mg Oral QHS  . multivitamin with minerals  1 tablet Oral Daily  . oxybutynin  10 mg Oral Daily  . protein supplement  2 oz Oral TID  . tamsulosin  0.4 mg Oral QPC supper    Objective: Vital signs in last 24 hours: Temp:  [98.4 F (36.9 C)-100.2 F (37.9 C)] 100.2 F (37.9 C) (06/28 1601) Pulse Rate:  [81-100] 96 (06/28 1600) Resp:  [13-28] 26 (06/28 1600) BP: (139-188)/(60-91) 147/60 mmHg (06/28 1600) SpO2:  [94 %-100 %] 95 % (06/28 1600)   General appearance: alert, cooperative and no distress Resp: clear to auscultation bilaterally Cardio: regular rate and rhythm GI: normal findings: bowel sounds normal and soft, non-tender Extremities: LLE blistering, warmth, swelling.   Lab  Results No results for input(s): WBC, HGB, HCT, NA, K, CL, CO2, BUN, CREATININE, GLU in the last 72 hours.  Invalid input(s): PLATELETS Liver Panel No results for input(s): PROT, ALBUMIN, AST, ALT, ALKPHOS, BILITOT, BILIDIR, IBILI in the last 72 hours. Sedimentation Rate No results for input(s): ESRSEDRATE in the last 72 hours. C-Reactive Protein No results for input(s): CRP in the last 72 hours.  Microbiology: Recent Results (from the past 240 hour(s))  Urine culture     Status: None   Collection Time: 03/14/15  7:39 PM  Result Value Ref Range Status   Specimen Description URINE, CLEAN CATCH  Final   Special Requests NONE  Final   Culture   Final    MULTIPLE SPECIES PRESENT, SUGGEST RECOLLECTION IF CLINICALLY INDICATED Performed at [facility]      Report Status 03/16/2015 FINAL  Final  Blood culture (routine x 2)     Status: None   Collection Time: 03/14/15  7:55 PM  Result Value Ref Range Status   Specimen Description BLOOD RIGHT FOREARM  Final   Special Requests BOTTLES DRAWN AEROBIC AND ANAEROBIC 5CC  Final   Culture   Final    NO GROWTH 5 DAYS Performed at [facility]    Report Status 03/19/2015 FINAL  Final  Blood culture (routine x 2)     Status: None   Collection Time: 03/14/15  7:55 PM  Result Value Ref Range Status   Specimen Description BLOOD RIGHT ANTECUBITAL  Final   Special Requests BOTTLES DRAWN AEROBIC AND ANAEROBIC 5ML  Final   Culture   Final    NO GROWTH 5 DAYS Performed at [facility]    Report Status 03/19/2015 FINAL  Final  MRSA PCR Screening     Status: None   Collection Time: 03/15/15  9:28 PM  Result Value Ref Range Status   MRSA by PCR NEGATIVE NEGATIVE Final    Comment:        The GeneXpert MRSA Assay (FDA approved for NASAL specimens only), is one component of a comprehensive MRSA colonization surveillance program. It is not intended to diagnose MRSA infection nor to guide or monitor treatment for MRSA infections.   Culture, blood (routine x 2)     Status: None   Collection Time: 03/16/15  7:24 PM  Result Value Ref Range Status   Specimen Description BLOOD BLOOD LEFT ARM  Final   Special Requests BOTTLES DRAWN AEROBIC AND ANAEROBIC 5ML  Final   Culture   Final    NO GROWTH 5 DAYS Performed at [facility]    Report Status 03/21/2015 FINAL  Final  Culture, blood (routine x 2)     Status: None   Collection Time: 03/16/15  8:12 PM  Result Value Ref Range Status   Specimen Description BLOOD BLOOD LEFT WRIST  Final   Special Requests BOTTLES DRAWN AEROBIC AND ANAEROBIC 5ML  Final   Culture   Final    NO GROWTH 5 DAYS Performed at [facility]    Report Status 03/21/2015 FINAL  Final  Culture, Urine     Status: None   Collection Time: 03/17/15 11:20  AM  Result Value Ref Range Status   Specimen Description URINE, CLEAN CATCH  Final   Special Requests NONE  Final   Culture   Final    NO GROWTH 1 DAY Performed at [facility]    Report Status 03/18/2015 FINAL  Final    Studies/Results: Mr Tibia Fibula  Right Wo Contrast  03/21/2015   CLINICAL DATA:  Type 2 diabetes. Chronic RIGHT lower extremity ulcer. Fevers, chills and pain. Bilateral lower extremity swelling. LEFT leg swelling. Evaluate for osteomyelitis or abscess.  EXAM: MRI OF LOWER RIGHT EXTREMITY WITHOUT CONTRAST  TECHNIQUE: Multiplanar, multisequence MR imaging of the LEFT leg was performed. No intravenous contrast was administered.  COMPARISON:  03/14/2015.  FINDINGS: There is swelling of the LEFT leg diffusely. This predominantly involves the subcutaneous tissues. Achilles tendon and ankle tendons appear within normal limits.  Diffuse subcutaneous edema is present in both legs, LEFT-greater-than-RIGHT without focal fluid collection to suggest abscess.  Incidental visualization of the contralateral RIGHT leg demonstrates atrophy of the RIGHT peroneus longus muscle.  Tibia and fibula marrow signal is normal. Negative for osteomyelitis. Cutaneous blistering is present in the anterior LEFT leg, probably associated with soft tissue infection.  IMPRESSION: LEFT-greater-than- RIGHT leg diffuse subcutaneous edema with mild muscular edema. No abscess. No osteomyelitis.   Electronically Signed   By: Dereck Ligas M.D.   On: 03/21/2015 08:27   Mr Foot Right Wo Contrast  03/21/2015   CLINICAL DATA:  Diffuse lower extremity edema. Type 2 diabetic with foot and leg ulceration. LEFT-greater-than- RIGHT leg swelling. Initial encounter.  EXAM: MRI OF THE RIGHT FOOT WITHOUT CONTRAST  TECHNIQUE: Multiplanar, multisequence MR imaging was performed. No intravenous contrast was administered.  COMPARISON:  None.  FINDINGS: Field of view extends from the distal leg to the midshaft metatarsals.  No  osteomyelitis in the hindfoot or ankle. Diffuse edema is present in Kager's fat pad. The Achilles tendon appears within normal limits. Mild ankle osteoarthritis is present with small osteochondral lesion of the talar dome. Posterior medial and posterior lateral tendon groups appear normal. Anterior tendon group is normal. No tenosynovitis. No evidence of septic arthritis. Small ankle effusion.  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 Negative for osteomyelitis. Mild midfoot osteoarthritis without neuropathic changes. No soft tissue abscess is identified. There appears to be blistering in the skin of the medial distal leg.  IMPRESSION: Negative for osteomyelitis or soft tissue abscess. Diffuse edema of the distal RIGHT leg and ankle which may represent dependent edema or cellulitis in the appropriate clinical setting.   Electronically Signed   By: Dereck Ligas M.D.   On: 03/21/2015 08:33   Mr Tibia Fibula Left Wo Contrast  03/21/2015   CLINICAL DATA:  Diabetic patient with lower extremity swelling, fever, chills and pain, left worse than right. Subsequent encounter.  EXAM: MRI OF LOWER LEFT EXTREMITY WITHOUT CONTRAST  TECHNIQUE: Multiplanar, multisequence MR imaging of the left lower leg was performed. No intravenous contrast was administered.  COMPARISON:  MRI left lower leg 03/16/2015.  FINDINGS: Imaging incidentally includes the right lower leg. Intense subcutaneous edema in the left lower leg is again seen and not notably changed compared to the prior MRI of. Subcutaneous edema is worse on the left compared to the right. No focal fluid collection is identified. Image lower leg musculature demonstrates some fatty atrophy. No intramuscular fluid collection or evidence of myositis is present. Bone marrow signal is normal.  IMPRESSION: Intense subcutaneous edema of the left lower leg most consistent with cellulitis. Negative for abscess or osteomyelitis.   Electronically Signed   By:  Inge Rise M.D.   On: 03/21/2015 08:18   Mr Foot Left Wo Contrast  03/21/2015   CLINICAL DATA:  Diabetic patient with lower extremity swelling, fever, chills and pain, left worse than right. Subsequent encounter.  EXAM: MRI OF THE LEFT FOREFOOT WITHOUT CONTRAST  TECHNIQUE: Multiplanar, multisequence MR imaging was performed. No intravenous contrast was administered.  COMPARISON:  MRI left foot 03/16/2015.  FINDINGS: Marked subcutaneous edema over the dorsum of the foot does not appear notably changed. No focal fluid collection is identified. There is no bone marrow signal abnormality to suggest osteomyelitis. Intrinsic musculature of the foot demonstrates some fatty atrophy. All visualized tendons are intact and unremarkable appearance. Joint spaces are preserved. No joint effusion is identified.  IMPRESSION: Intense subcutaneous edema over the foot compatible with cellulitis and/or dependent change. Negative for osteomyelitis or abscess.   Electronically Signed   By: Inge Rise M.D.   On: 03/21/2015 08:26     Assessment/Plan: DM2, poorly controlled with complications Diabetic Foot ulcer, chronic venous stasis ulcers Sepsis Cellulitis LLE ? Pyomyositis Fever  Total days of antibiotics: 7 6-24 unasyn/zyvox     MRI without abscess or osteo Making some clinical improvement Appreciate wound care f/u Would aim for 2 weeks of anbx Continue to watch      [name] Infectious Diseases (pager) [phone] [email or web address] 03/21/2015, 4:38 PM  LOS: 7 days

## 2015-03-21 NOTE — Progress Notes (Signed)
Inpatient Diabetes Program Recommendations  AACE/ADA: New Consensus Statement on Inpatient Glycemic Control (2013)  Target Ranges:  Prepandial:   less than 140 mg/dL      Peak postprandial:   less than 180 mg/dL (1-2 hours)      Critically ill patients:  140 - 180 mg/dL   Reason for Visit: Hyperglycemia Results for Haigler, HOSEY (MRN [id number]) as of 03/21/2015 10:50  Ref. Range 03/20/2015 15:37 03/20/2015 17:00 03/21/2015 07:52  Glucose-Capillary Latest Ref Range: 65-99 mg/dL 183 (H) 181 (H) 247 (H)     Inpatient Diabetes Program Recommendations Insulin - Basal: If FBS > 180, will need titration of 70/30 insulin Correction (SSI): Increase Novolog to moderate tidwc and hs  Thank you. Lorenda Peck, RD, LDN, CDE Inpatient Diabetes Coordinator [phone]

## 2015-03-21 NOTE — Progress Notes (Signed)
   Subjective: Hospital day - 7 Patient reports pain as mild and moderate.   Patient seen in rounds with Dr. Wynelle Link. Patient is well, but has had some minor complaints of pain in the left leg, requiring pain medications  Objective: Vital signs in last 24 hours: Temp:  [97.3 F (36.3 C)-99 F (37.2 C)] 98.4 F (36.9 C) (06/28 0800) Pulse Rate:  [75-100] 81 (06/28 0800) Resp:  [13-24] 15 (06/28 0800) BP: (133-188)/(66-87) 161/75 mmHg (06/28 0800) SpO2:  [96 %-100 %] 98 % (06/28 0800)  Intake/Output from previous day:  Intake/Output Summary (Last 24 hours) at 03/21/15 0941 Last data filed at 03/21/15 0600  Gross per 24 hour  Intake    385 ml  Output    900 ml  Net   -515 ml     Labs: No results for input(s): HGB in the last 72 hours. No results for input(s): WBC, RBC, HCT, PLT in the last 72 hours. No results for input(s): NA, K, CL, CO2, BUN, CREATININE, GLUCOSE, CALCIUM in the last 72 hours. No results for input(s): LABPT, INR in the last 72 hours.  EXAM General - Patient is Alert, Appropriate and Oriented Extremity - Neurovascular intact Sensation intact distally Continues with swelling in the leg but appears slightly less in the left thigh. Motor Function - intact, moving foot and toes well on exam.   Past Medical History  Diagnosis Date  . Diabetes mellitus without complication   . Hypertension   . Chronic hip pain   . GERD (gastroesophageal reflux disease)   . Depression     Assessment/Plan: Hospital day - 7 Principal Problem:   Sepsis Active Problems:   FUO (fever of unknown origin)   Pyomyositis   Cellulitis of left lower extremity   DM (diabetes mellitus), secondary, uncontrolled, with peripheral vascular complications   MSSA (methicillin susceptible Staphylococcus aureus) infection   Leukocytosis   Anemia, iron deficiency   Cellulitis  Estimated body mass index is 31.14 kg/(m^2) as calculated from the following:   Height as of this encounter:  6\' 1"  (1.854 m).   Weight as of this encounter: 107.049 kg (236 lb).  The repeat MRI results are pending at this time.  Arlee Muslim, PA-C Orthopaedic Surgery 03/21/2015, 9:41 AM

## 2015-03-22 LAB — CBC
HCT: 24.1 % — ABNORMAL LOW (ref 39.0–52.0)
HEMOGLOBIN: 7.5 g/dL — AB (ref 13.0–17.0)
MCH: 19.9 pg — ABNORMAL LOW (ref 26.0–34.0)
MCHC: 31.1 g/dL (ref 30.0–36.0)
MCV: 64.1 fL — ABNORMAL LOW (ref 78.0–100.0)
Platelets: 342 10*3/uL (ref 150–400)
RBC: 3.76 MIL/uL — AB (ref 4.22–5.81)
RDW: 16.5 % — ABNORMAL HIGH (ref 11.5–15.5)
WBC: 10.2 10*3/uL (ref 4.0–10.5)

## 2015-03-22 LAB — GLUCOSE, CAPILLARY
GLUCOSE-CAPILLARY: 200 mg/dL — AB (ref 65–99)
GLUCOSE-CAPILLARY: 167 mg/dL — AB (ref 65–99)
Glucose-Capillary: 217 mg/dL — ABNORMAL HIGH (ref 65–99)
Glucose-Capillary: 199 mg/dL — ABNORMAL HIGH (ref 65–99)

## 2015-03-22 LAB — BASIC METABOLIC PANEL
Anion gap: 9 (ref 5–15)
BUN: 19 mg/dL (ref 6–20)
CO2: 27 mmol/L (ref 22–32)
CREATININE: 0.9 mg/dL (ref 0.61–1.24)
Calcium: 8.2 mg/dL — ABNORMAL LOW (ref 8.9–10.3)
Chloride: 106 mmol/L (ref 101–111)
GFR calc Af Amer: >60 mL/min (ref >60)
GFR calc non Af Amer: >60 mL/min (ref >60)
Glucose, Bld: 221 mg/dL — ABNORMAL HIGH (ref 65–99)
Potassium: 3.9 mmol/L (ref 3.5–5.1)
Sodium: 142 mmol/L (ref 135–145)

## 2015-03-22 MED ORDER — SODIUM CHLORIDE 0.9 % IV SOLN: Freq: Once | INTRAVENOUS | Status: DC

## 2015-03-22 NOTE — Progress Notes (Signed)
Pt scheduled for a blood transfusion today. Pt refused transfusion. 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 MD made aware. Order discontinued by MD. Consent form placed in chart. When signed by MD, will tube to lab. Vwilliams,rn.

## 2015-03-22 NOTE — Progress Notes (Addendum)
Patient ID: [name], male   DOB: 30-Jun-1956, 59 y.o.   MRN: [id number] [facility] PROGRESS NOTE  [name] CBJ:[id number] DOB: 02/28/1956 DOA: 03/14/2015 PCP: PROVIDER NOT IN SYSTEM - pt follows in [place] center   Brief narrative:    59 y.o. male who follows in [place] center so we do not have much information in EPIC of his previous medical record. Patient has history of diabetes mellitus type 2, chronic right lower extremity ulcer who presented to [facility] ED with fevers, chills and pain and swelling in B/L LE.  On admission, patient was febrile with tachycardia, tachypnea. No leukocytosis. No evidence of osteomyelitis on x- ray studies. MRI of the left foot showed diffuse cellulitis of the left leg and foot without abscess or osteomyelitis. Lower extremity doppler was negative for DVT. Ortho has seen the patient in consultation and continues to recommend treatment with abx and no surgical intervention.  Patient's hospital course was complicated with ongoing fevers while on IV abx, vancomycin and Zosyn. His blood cultures from the admission were negative. Because he continued to spike fever we added Flagyl on 03/16/2015. This did not seem to help so we consulted infectious disease on 03/17/2015. They recommended changing antibiotics to Zyvox and Unasyn on 03/17/2015. As part of fever workup repeat blood cultures from 03/16/2015 continue to remain negative. Over last 48 hours he continues to be afebrile. Infectious disease recommends continuing antibodies for next 2 weeks.  Barrier to discharge: Appreciate case management and recommendations for LTAC. Hopefully we can transition patient in next 1-2 days to LTAC.   Assessment/Plan:    Principal Problem: Sepsis due to bilateral lower extremity cellulitis / suspected MSSA cellulitis / pyomyositis / Leukocytosis / Fever of unknown origin - Sepsis criteria met on admission. Procalcitonin on admission was elevated at 2.65 and lactic acid was elevated at  2.01. Suspected source of infection thought to be MSSA cellulitis or possibly pyomyositis. MRI of the left leg on the admission was significant for cellulitis, no abscess. Blood cultures on admission were negative to date. - Since patient continued to be septic with ongoing fevers, tachycardia, tachypnea and now leukocytosis we initiated fever of unknown origin workup. Patient had CT abdomen, pelvis, chest done on 03/17/2015 with no clear source of infection identified. - He finally had repeat MRI of both extremities 03/21/2015 and this showed ongoing cellulitis but no abscess or osteomyelitis. - Please note that patient was initially on vancomycin and Zosyn. We added Flagyl 03/16/2015 however since he continued to spike fevers ID consulted on 03/17/15 and abx changed to zyvox and unasyn which per ID pt need to continue for 2 week (will check with them of what the starting day should be but most likely from 03/21/2015).  - Repeat blood cultures 03/16/2015 remain negative to date   Active Problems: Bilateral lower extremity edema with LE ulcers - Ortho was consulted on the admission. Since patient clinically improving they do not recommend any surgical intervention. - We'll continue current antibiotics. - Left lower extremity doppler was negative for DVT - WOC consulted, appreciate their assessment. The LE ulcers are not pressure ulcers. He has a chronic venous stasis ulcers bilaterally. Right medial malleolus; medial and lateral left malleolus (R medial: 7cm x 4cm x 0.5cm ; L medial: 6cm x 4cm x 0.2cm; L lateral: 14cm x 4cm x 0.2cm). Right medial ulcer is deeper with moderate exudate and Left medial and lateral ulcers are full thickness. Dressing recommendations: Calcium alginate for exudate management, silver hydrofiber due  to chronicity. Right LE wound to be covered with soft silicone foam to accommodate excess exudate.  Diabetes mellitus with peripheral vascular manifestations and neuropathy,  uncontrolled - A1c 9.8 indicating poor glycemic control  - Continue novolog 70/30 mix 20 units BID along with SSI. Metformin on hold due to acute infection  - CBG's in past 24 hours: 166, 173, 200 - Appreciate diabetic coordinator recommendations  - Continue gabapentin for neuropathy  Essential hypertension - Added hydralazine for better BP control - Current blood pressure is 143/76 - Continue hydralazine 10 mg every 8 hours  Dyslipidemia - Continue statin therapy   Iron deficiency anemia - Continue iron supplementation  - Hemoglobin is down to 7.5 this morning - We'll give 1 unit of PRBC today  BPH - Continue Flomax daily   DVT Prophylaxis  - Lovenox subQ ordered while pt in hospital    Code Status: Full.  Family Communication: plan of care discussed with the patient Disposition Plan: to LTAC in next 1-2 days   IV access:  Peripheral IV  Procedures and diagnostic studies:    Mr Tibia Fibula Right Wo Contrast 03/21/2015 LEFT-greater-than- RIGHT leg diffuse subcutaneous edema with mild muscular edema. No abscess. No osteomyelitis. Electronically Signed By: Dereck Ligas M.D. On: 03/21/2015 08:27   Mr Foot Right Wo Contrast 03/21/2015 Negative for osteomyelitis or soft tissue abscess. Diffuse edema of the distal RIGHT leg and ankle which may represent dependent edema or cellulitis in the appropriate clinical setting. Electronically Signed By: Dereck Ligas M.D. On: 03/21/2015 08:33   Mr Tibia Fibula Left Wo Contrast 03/21/2015 Intense subcutaneous edema of the left lower leg most consistent with cellulitis. Negative for abscess or osteomyelitis. Electronically Signed By: Inge Rise M.D. On: 03/21/2015 08:18   Mr Foot Left Wo Contrast 03/21/2015 ntense subcutaneous edema over the foot compatible with cellulitis and/or dependent change. Negative for osteomyelitis or abscess. Electronically Signed By: Inge Rise M.D. On: 03/21/2015  08:26   Ct Chest W Contrast 03/17/2015 1. Four chamber cardiomegaly with secondary pulmonary lobular interstitial thickening and faint ground-glass opacity suggesting interstitial pulmonary edema. Mild nodal enlargement in the chest could be secondary to passive congestion but is nonspecific. 2. There is subcutaneous edema tracking along the flanks and upper thighs, of uncertain significance. In addition, there is mild adenopathy in the lower pelvis and inguinal regions. Although potentially reactive, into the such as lymphoma are not specifically excluded, and if the palpable Ingle oral abnormality fails to resolve with appropriate therapy, biopsy might be considered. 3. 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 4. Coronary, thoracic aortic, and aortoiliac atherosclerotic calcification. 5. Cervical, thoracic, and lumbar spondylosis with lumbar degenerative disc disease. 6. Multiple duodenal diverticula, none of which appear inflamed. 7. Prominent stool throughout the colon favors constipation. Electronically Signed By: Van Clines M.D. On: 03/17/2015 15:38   Ct Abdomen Pelvis W Contrast 03/17/2015 1. Four chamber cardiomegaly with secondary pulmonary lobular interstitial thickening and faint ground-glass opacity suggesting interstitial pulmonary edema. Mild nodal enlargement in the chest could be secondary to passive congestion but is nonspecific. 2. There is subcutaneous edema tracking along the flanks and upper thighs, of uncertain significance. In addition, there is mild adenopathy in the lower pelvis and inguinal regions. Although potentially reactive, into the such as lymphoma are not specifically excluded, and if the palpable Ingle oral abnormality fails to resolve with appropriate therapy, biopsy might be considered. 3. 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 4. Coronary, thoracic aortic, and  aortoiliac atherosclerotic  calcification. 5. Cervical, thoracic, and lumbar spondylosis with lumbar degenerative disc disease. 6. Multiple duodenal diverticula, none of which appear inflamed. 7. Prominent stool throughout the colon favors constipation.   Dg Chest 2 View 03/14/2015 Low lung volumes with bibasilar opacities favored to represent atelectasis. Cardiac contours upper limits of normal.   Dg Tibia/fibula Right 03/14/2015 Soft tissue ulceration along the medial aspect of the distal tibia. No radiographic findings to suggest osteomyelitis.   Mr Tibia Fibula Left Wo Contrast 03/16/2015 Diffuse cellulitis of the LEFT leg and foot, without abscess or osteomyelitis. Left-greater-than-right leg swelling with a diameter of the LEFT leg about 1.5 times the RIGHT leg.   Mr Foot Left Wo Contrast 03/16/2015 Diffuse cellulitis of the LEFT leg and foot, without abscess or osteomyelitis. Left-greater-than-right leg swelling with a diameter of the LEFT leg about 1.5 times the RIGHT leg.   Dg Foot Complete Left 03/14/2015 Diffuse swelling of the foot without plain film evidence of osteomyelitis.   Medical Consultants:  Orthopedic surgery Infectious disease, Dr. Tommy Medal  Other Consultants:  Diabetic coordinator  [place] Case management for LTAC placement   IAnti-Infectives:   Vanco 03/14/2015 --> 03/17/2015  Zosyn 03/14/2015 --> 03/17/2015 Flagyl 03/16/2015 --> 03/17/2015 Zyvox 03/17/2015 --> Unasyn 03/17/2015 -->    DEVINE, ALMA, MD  [facility] Pager [phone]  Time spent in minutes: 25 minutes  If 7PM-7AM, please contact night-coverage [email or web address] Password [email or web address] 03/22/2015, 9:23 AM   LOS: 8 days    HPI/Subjective: No acute overnight events. Patient reports pain in legs 4/10 in intensity.   Objective: Filed Vitals:   03/21/15 2000 03/22/15 0000 03/22/15 0400 03/22/15 0740  BP: 166/77 126/93 143/76   Pulse: 99 86 80   Temp: 99.9 F (37.7 C) 98.9 F (37.2 C) 98.5 F (36.9 C) 97.9 F  (36.6 C)  TempSrc: Oral Oral Oral Oral  Resp: 30 16 20    Height:      Weight:      SpO2: 100% 100% 99%     Intake/Output Summary (Last 24 hours) at 03/22/15 0923 Last data filed at 03/22/15 0700  Gross per 24 hour  Intake   1480 ml  Output    350 ml  Net   1130 ml    Exam:   General:  Pt is alert, follows commands appropriately, not in acute distress  Cardiovascular: Regular rate and rhythm, S1/S2, no murmurs  Respiratory: Clear to auscultation bilaterally, no wheezing, no crackles, no rhonchi  Abdomen: Soft, non tender, non distended, bowel sounds present  Extremities: extensive bilateral edema, pulses DP and PT palpable bilaterally  Neuro: Grossly nonfocal  Data Reviewed: Basic Metabolic Panel:  Recent Labs Lab 03/16/15 1735 03/17/15 0345 03/18/15 0356 03/22/15 0818  NA 138 141 136 142  K 3.9 3.7 3.8 3.9  CL 107 110 106 106  CO2 23 22 23 27   GLUCOSE 184* 123* 169* 221*  BUN 18 17 15 19   CREATININE 1.02 1.07 1.13 0.90  CALCIUM 7.9* 8.0* 8.0* 8.2*   Liver Function Tests: No results for input(s): AST, ALT, ALKPHOS, BILITOT, PROT, ALBUMIN in the last 168 hours. No results for input(s): LIPASE, AMYLASE in the last 168 hours. No results for input(s): AMMONIA in the last 168 hours. CBC:  Recent Labs Lab 03/16/15 1735 03/17/15 0345 03/18/15 0356 03/22/15 0818  WBC 10.8* 10.4 11.7* 10.2  HGB 8.0* 8.1* 8.2* 7.5*  HCT 25.9* 25.4* 25.3* 24.1*  MCV 64.3* 64.5* 64.5* 64.1*  PLT 187 196 202 342  Cardiac Enzymes: No results for input(s): CKTOTAL, CKMB, CKMBINDEX, TROPONINI in the last 168 hours. BNP: Invalid input(s): POCBNP CBG:  Recent Labs Lab 03/21/15 0752 03/21/15 1131 03/21/15 1544 03/21/15 2110 03/22/15 0738  GLUCAP 247* 183* 166* 173* 200*    Recent Results (from the past 240 hour(s))  Urine culture     Status: None   Collection Time: 03/14/15  7:39 PM  Result Value Ref Range Status   Specimen Description URINE, CLEAN CATCH  Final    Special Requests NONE  Final   Culture   Final    MULTIPLE SPECIES PRESENT, SUGGEST RECOLLECTION IF CLINICALLY INDICATED Performed at [facility]    Report Status 03/16/2015 FINAL  Final  Blood culture (routine x 2)     Status: None   Collection Time: 03/14/15  7:55 PM  Result Value Ref Range Status   Specimen Description BLOOD RIGHT FOREARM  Final   Special Requests BOTTLES DRAWN AEROBIC AND ANAEROBIC 5CC  Final   Culture   Final    NO GROWTH 5 DAYS Performed at [facility]    Report Status 03/19/2015 FINAL  Final  Blood culture (routine x 2)     Status: None   Collection Time: 03/14/15  7:55 PM  Result Value Ref Range Status   Specimen Description BLOOD RIGHT ANTECUBITAL  Final   Special Requests BOTTLES DRAWN AEROBIC AND ANAEROBIC 5ML  Final   Culture   Final    NO GROWTH 5 DAYS Performed at [facility]    Report Status 03/19/2015 FINAL  Final  MRSA PCR Screening     Status: None   Collection Time: 03/15/15  9:28 PM  Result Value Ref Range Status   MRSA by PCR NEGATIVE NEGATIVE Final    Comment:        The GeneXpert MRSA Assay (FDA approved for NASAL specimens only), is one component of a comprehensive MRSA colonization surveillance program. It is not intended to diagnose MRSA infection nor to guide or monitor treatment for MRSA infections.   Culture, blood (routine x 2)     Status: None   Collection Time: 03/16/15  7:24 PM  Result Value Ref Range Status   Specimen Description BLOOD BLOOD LEFT ARM  Final   Special Requests BOTTLES DRAWN AEROBIC AND ANAEROBIC 5ML  Final   Culture   Final    NO GROWTH 5 DAYS Performed at [facility]    Report Status 03/21/2015 FINAL  Final  Culture, blood (routine x 2)     Status: None   Collection Time: 03/16/15  8:12 PM  Result Value Ref Range Status   Specimen Description BLOOD BLOOD LEFT WRIST  Final   Special Requests BOTTLES DRAWN AEROBIC AND ANAEROBIC 5ML  Final   Culture   Final    NO  GROWTH 5 DAYS Performed at [facility]    Report Status 03/21/2015 FINAL  Final  Culture, Urine     Status: None   Collection Time: 03/17/15 11:20 AM  Result Value Ref Range Status   Specimen Description URINE, CLEAN CATCH  Final   Special Requests NONE  Final   Culture   Final    NO GROWTH 1 DAY Performed at [facility]    Report Status 03/18/2015 FINAL  Final     Scheduled Meds: . sodium chloride   Intravenous Once  . ampicillin-sulbactam (UNASYN) IV  3 g Intravenous Q6H  . antiseptic oral rinse  7 mL Mouth Rinse  BID  . aspirin EC  81 mg Oral BH-q7a  . atorvastatin  80 mg Oral Daily  . enoxaparin (LOVENOX) injection  0.5 mg/kg Subcutaneous Q24H  . ferrous sulfate  325 mg Oral TID WC  . gabapentin  300 mg Oral TID  . hydrALAZINE  10 mg Oral 3 times per day  . insulin aspart  0-9 Units Subcutaneous TID WC  . insulin aspart protamine- aspart  20 Units Subcutaneous BID WC  . ketorolac  30 mg Intravenous 4 times per day  . linezolid  600 mg Oral Q12H  . mirtazapine  15 mg Oral QHS  . multivitamin with minerals  1 tablet Oral Daily  . oxybutynin  10 mg Oral Daily  . protein supplement  2 oz Oral TID  . tamsulosin  0.4 mg Oral QPC supper   Continuous Infusions:

## 2015-03-22 NOTE — Progress Notes (Signed)
Physical Therapy Treatment Patient Details Name: [name] MRN: [id number] DOB: [birth year] Today's Date: 03/22/2015    History of Present Illness Mr. [name] is a 59 y.o. male with history of diabetes mellitus type 2 chronic right lower extremity ulcer on the leg, degenerative hip presents to the ER 03/14/15  because of fever and chills. Patient states he has been having fever chills over the last 2 days. Patient also over the last 1 week notice increasing swelling in his left lower extremity which is extended proximally. Patient also developed some blisters on his foot on the left side which had denuded to form ulcers. Patient's leg on exam is swollen up to his thigh and tense. Patient has severe pain on trying to move. Patient has sensation and denies any tingling or numbness. Patient's leg is warm to touch up to the foot. Patient also has a chronic ulcer on his right shin. There is no active discharge from this area. X-rays do not reveal any active bony involvement. Patient was febrile and tachycardic and was given fluid bolus. Patient is admitted for sepsis from cellulitis of the lower extremity with concern for compartment syndrome. 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    PT Comments    Patient expresses concern that he has not gotten up OOB. Explained to patient that this PT has assisted  Him to the edge of the bed x 2 requiring extensive assist of 2 persons with his  Difficulty in balance due to the amount of  Fluid/swelling in his trunk and legs  Andriskk for L leg getting abraded during transition. The skin is extremely viable. Discussed PT plan for tomorrow in placing in bed/chair position and working  On trunk control. Provided theraband for UE exercises.  Follow Up Recommendations  LTACH;SNF     Equipment Recommendations  None recommended by PT    Recommendations for Other Services       Precautions / Restrictions Precautions Precautions: Fall Precaution Comments: L  Leg weeping , una boot on R    Mobility  Bed Mobility               General bed mobility comments: assisted patient in positioning himself in the bed with legs elevated as patient felt that he needed the legs elevated. Patient's bed has a remote that can place in trendelenberg. patient was more comfortable in this position. RN aware of  patient's bed position  Transfers                    Ambulation/Gait                 Stairs            Wheelchair Mobility    Modified Rankin (Stroke Patients Only)       Balance                                    Cognition Arousal/Alertness: Awake/alert                          Exercises Other Exercises Other Exercises: provided blue thraband for patient to perform  self exercises.    General Comments        Pertinent Vitals/Pain Pain Score: 6  Pain Location: L leg Pain Descriptors / Indicators: [place]  Prior Function            PT Goals (current goals can now be found in the care plan section) Progress towards PT goals: Progressing toward goals    Frequency  Min 3X/week    PT Plan Current plan remains appropriate    Co-evaluation             End of Session   Activity Tolerance: Patient tolerated treatment well Patient left: in bed;with call bell/phone within reach     Time: 8616-8372 PT Time Calculation (min) (ACUTE ONLY): 13 min  Charges:  $Therapeutic Activity: 8-22 mins                    G Codes:      [name] 03/22/2015, 5:19 PM [name] PT [phone]

## 2015-03-22 NOTE — Progress Notes (Signed)
INFECTIOUS DISEASE PROGRESS NOTE  ID: [name] is a 59 y.o. male with  Principal Problem:   Sepsis Active Problems:   FUO (fever of unknown origin)   Pyomyositis   Cellulitis of left lower extremity   DM (diabetes mellitus), secondary, uncontrolled, with peripheral vascular complications   MSSA (methicillin susceptible Staphylococcus aureus) infection   Leukocytosis   Anemia, iron deficiency   Cellulitis  Subjective: Without complaints  Abtx:  Anti-infectives    Start     Dose/Rate Route Frequency Ordered Stop   03/17/15 1400  Ampicillin-Sulbactam (UNASYN) 3 g in sodium chloride 0.9 % 100 mL IVPB     3 g 100 mL/hr over 60 Minutes Intravenous Every 6 hours 03/17/15 1347     03/17/15 1400  linezolid (ZYVOX) tablet 600 mg     600 mg Oral Every 12 hours 03/17/15 1347     03/16/15 2000  metroNIDAZOLE (FLAGYL) IVPB 500 mg  Status:  Discontinued     500 mg 100 mL/hr over 60 Minutes Intravenous Every 8 hours 03/16/15 1924 03/17/15 1347   03/15/15 1000  vancomycin (VANCOCIN) IVPB 750 mg/150 ml premix  Status:  Discontinued     750 mg 150 mL/hr over 60 Minutes Intravenous Every 12 hours 03/15/15 0106 03/17/15 1347   03/15/15 0400  piperacillin-tazobactam (ZOSYN) IVPB 3.375 g  Status:  Discontinued     3.375 g 12.5 mL/hr over 240 Minutes Intravenous Every 8 hours 03/15/15 0104 03/17/15 1347   03/15/15 0115  vancomycin (VANCOCIN) IVPB 1000 mg/200 mL premix     1,000 mg 200 mL/hr over 60 Minutes Intravenous  Once 03/15/15 0106 03/15/15 0225   03/14/15 2000  vancomycin (VANCOCIN) IVPB 1000 mg/200 mL premix     1,000 mg 200 mL/hr over 60 Minutes Intravenous  Once 03/14/15 1950 03/14/15 2112   03/14/15 2000  piperacillin-tazobactam (ZOSYN) IVPB 3.375 g     3.375 g 100 mL/hr over 30 Minutes Intravenous  Once 03/14/15 1950 03/14/15 2041      Medications:  Scheduled: . sodium chloride   Intravenous Once  . ampicillin-sulbactam (UNASYN) IV  3 g Intravenous Q6H  . antiseptic oral  rinse  7 mL Mouth Rinse BID  . aspirin EC  81 mg Oral BH-q7a  . atorvastatin  80 mg Oral Daily  . enoxaparin (LOVENOX) injection  0.5 mg/kg Subcutaneous Q24H  . ferrous sulfate  325 mg Oral TID WC  . gabapentin  300 mg Oral TID  . hydrALAZINE  10 mg Oral 3 times per day  . insulin aspart  0-9 Units Subcutaneous TID WC  . insulin aspart protamine- aspart  20 Units Subcutaneous BID WC  . ketorolac  30 mg Intravenous 4 times per day  . linezolid  600 mg Oral Q12H  . mirtazapine  15 mg Oral QHS  . multivitamin with minerals  1 tablet Oral Daily  . oxybutynin  10 mg Oral Daily  . protein supplement  2 oz Oral TID  . tamsulosin  0.4 mg Oral QPC supper    Objective: Vital signs in last 24 hours: Temp:  [97.9 F (36.6 C)-100.2 F (37.9 C)] 99 F (37.2 C) (06/29 1200) Pulse Rate:  [76-99] 78 (06/29 1200) Resp:  [16-30] 23 (06/29 1200) BP: (126-166)/(60-93) 163/79 mmHg (06/29 1200) SpO2:  [95 %-100 %] 100 % (06/29 1200)   General appearance: alert, cooperative and no distress Extremities: RLE warm, erythematous, dressing adherent to wound on lateral foot. tender.   Lab Results  Recent Labs  03/22/15 0818  WBC 10.2  HGB 7.5*  HCT 24.1*  NA 142  K 3.9  CL 106  CO2 27  BUN 19  CREATININE 0.90   Liver Panel No results for input(s): PROT, ALBUMIN, AST, ALT, ALKPHOS, BILITOT, BILIDIR, IBILI in the last 72 hours. Sedimentation Rate No results for input(s): ESRSEDRATE in the last 72 hours. C-Reactive Protein No results for input(s): CRP in the last 72 hours.  Microbiology: Recent Results (from the past 240 hour(s))  Urine culture     Status: None   Collection Time: 03/14/15  7:39 PM  Result Value Ref Range Status   Specimen Description URINE, CLEAN CATCH  Final   Special Requests NONE  Final   Culture   Final    MULTIPLE SPECIES PRESENT, SUGGEST RECOLLECTION IF CLINICALLY INDICATED Performed at [facility]    Report Status 03/16/2015 FINAL  Final  Blood  culture (routine x 2)     Status: None   Collection Time: 03/14/15  7:55 PM  Result Value Ref Range Status   Specimen Description BLOOD RIGHT FOREARM  Final   Special Requests BOTTLES DRAWN AEROBIC AND ANAEROBIC 5CC  Final   Culture   Final    NO GROWTH 5 DAYS Performed at [facility]    Report Status 03/19/2015 FINAL  Final  Blood culture (routine x 2)     Status: None   Collection Time: 03/14/15  7:55 PM  Result Value Ref Range Status   Specimen Description BLOOD RIGHT ANTECUBITAL  Final   Special Requests BOTTLES DRAWN AEROBIC AND ANAEROBIC 5ML  Final   Culture   Final    NO GROWTH 5 DAYS Performed at [facility]    Report Status 03/19/2015 FINAL  Final  MRSA PCR Screening     Status: None   Collection Time: 03/15/15  9:28 PM  Result Value Ref Range Status   MRSA by PCR NEGATIVE NEGATIVE Final    Comment:        The GeneXpert MRSA Assay (FDA approved for NASAL specimens only), is one component of a comprehensive MRSA colonization surveillance program. It is not intended to diagnose MRSA infection nor to guide or monitor treatment for MRSA infections.   Culture, blood (routine x 2)     Status: None   Collection Time: 03/16/15  7:24 PM  Result Value Ref Range Status   Specimen Description BLOOD BLOOD LEFT ARM  Final   Special Requests BOTTLES DRAWN AEROBIC AND ANAEROBIC 5ML  Final   Culture   Final    NO GROWTH 5 DAYS Performed at [facility]    Report Status 03/21/2015 FINAL  Final  Culture, blood (routine x 2)     Status: None   Collection Time: 03/16/15  8:12 PM  Result Value Ref Range Status   Specimen Description BLOOD BLOOD LEFT WRIST  Final   Special Requests BOTTLES DRAWN AEROBIC AND ANAEROBIC 5ML  Final   Culture   Final    NO GROWTH 5 DAYS Performed at [facility]    Report Status 03/21/2015 FINAL  Final  Culture, Urine     Status: None   Collection Time: 03/17/15 11:20 AM  Result Value Ref Range Status   Specimen  Description URINE, CLEAN CATCH  Final   Special Requests NONE  Final   Culture   Final    NO GROWTH 1 DAY Performed at [facility]    Report Status 03/18/2015 FINAL  Final  Studies/Results: Mr Tibia Fibula Right Wo Contrast  03/21/2015   CLINICAL DATA:  Type 2 diabetes. Chronic RIGHT lower extremity ulcer. Fevers, chills and pain. Bilateral lower extremity swelling. LEFT leg swelling. Evaluate for osteomyelitis or abscess.  EXAM: MRI OF LOWER RIGHT EXTREMITY WITHOUT CONTRAST  TECHNIQUE: Multiplanar, multisequence MR imaging of the LEFT leg was performed. No intravenous contrast was administered.  COMPARISON:  03/14/2015.  FINDINGS: There is swelling of the LEFT leg diffusely. This predominantly involves the subcutaneous tissues. Achilles tendon and ankle tendons appear within normal limits.  Diffuse subcutaneous edema is present in both legs, LEFT-greater-than-RIGHT without focal fluid collection to suggest abscess.  Incidental visualization of the contralateral RIGHT leg demonstrates atrophy of the RIGHT peroneus longus muscle.  Tibia and fibula marrow signal is normal. Negative for osteomyelitis. Cutaneous blistering is present in the anterior LEFT leg, probably associated with soft tissue infection.  IMPRESSION: LEFT-greater-than- RIGHT leg diffuse subcutaneous edema with mild muscular edema. No abscess. No osteomyelitis.   Electronically Signed   By: Dereck Ligas M.D.   On: 03/21/2015 08:27   Mr Foot Right Wo Contrast  03/21/2015   CLINICAL DATA:  Diffuse lower extremity edema. Type 2 diabetic with foot and leg ulceration. LEFT-greater-than- RIGHT leg swelling. Initial encounter.  EXAM: MRI OF THE RIGHT FOOT WITHOUT CONTRAST  TECHNIQUE: Multiplanar, multisequence MR imaging was performed. No intravenous contrast was administered.  COMPARISON:  None.  FINDINGS: Field of view extends from the distal leg to the midshaft metatarsals.  No osteomyelitis in the hindfoot or ankle. Diffuse  edema is present in Kager's fat pad. The Achilles tendon appears within normal limits. Mild ankle osteoarthritis is present with small osteochondral lesion of the talar dome. Posterior medial and posterior lateral tendon groups appear normal. Anterior tendon group is normal. No tenosynovitis. No evidence of septic arthritis. Small ankle effusion.  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 Negative for osteomyelitis. Mild midfoot osteoarthritis without neuropathic changes. No soft tissue abscess is identified. There appears to be blistering in the skin of the medial distal leg.  IMPRESSION: Negative for osteomyelitis or soft tissue abscess. Diffuse edema of the distal RIGHT leg and ankle which may represent dependent edema or cellulitis in the appropriate clinical setting.   Electronically Signed   By: Dereck Ligas M.D.   On: 03/21/2015 08:33   Mr Tibia Fibula Left Wo Contrast  03/21/2015   CLINICAL DATA:  Diabetic patient with lower extremity swelling, fever, chills and pain, left worse than right. Subsequent encounter.  EXAM: MRI OF LOWER LEFT EXTREMITY WITHOUT CONTRAST  TECHNIQUE: Multiplanar, multisequence MR imaging of the left lower leg was performed. No intravenous contrast was administered.  COMPARISON:  MRI left lower leg 03/16/2015.  FINDINGS: Imaging incidentally includes the right lower leg. Intense subcutaneous edema in the left lower leg is again seen and not notably changed compared to the prior MRI of. Subcutaneous edema is worse on the left compared to the right. No focal fluid collection is identified. Image lower leg musculature demonstrates some fatty atrophy. No intramuscular fluid collection or evidence of myositis is present. Bone marrow signal is normal.  IMPRESSION: Intense subcutaneous edema of the left lower leg most consistent with cellulitis. Negative for abscess or osteomyelitis.   Electronically Signed   By: Inge Rise M.D.   On: 03/21/2015 08:18   Mr  Foot Left Wo Contrast  03/21/2015   CLINICAL DATA:  Diabetic patient with lower extremity swelling, fever, chills and pain, left worse than  right. Subsequent encounter.  EXAM: MRI OF THE LEFT FOREFOOT WITHOUT CONTRAST  TECHNIQUE: Multiplanar, multisequence MR imaging was performed. No intravenous contrast was administered.  COMPARISON:  MRI left foot 03/16/2015.  FINDINGS: Marked subcutaneous edema over the dorsum of the foot does not appear notably changed. No focal fluid collection is identified. There is no bone marrow signal abnormality to suggest osteomyelitis. Intrinsic musculature of the foot demonstrates some fatty atrophy. All visualized tendons are intact and unremarkable appearance. Joint spaces are preserved. No joint effusion is identified.  IMPRESSION: Intense subcutaneous edema over the foot compatible with cellulitis and/or dependent change. Negative for osteomyelitis or abscess.   Electronically Signed   By: Inge Rise M.D.   On: 03/21/2015 08:26     Assessment/Plan: DM2, poorly controlled with complications Diabetic Foot ulcer, chronic venous stasis ulcers Sepsis Cellulitis LLE ? Pyomyositis Fever  Total days of antibiotics: 8 6-24 unasyn/zyvox  Needs wound care f/u (I spoke with RN) PLT stable on zyvox, so far Diabetic control Would aim to continue unasyn/zyvox to 03-27-15. Could change to po if not done by d/c.          [name] Infectious Diseases (pager) [phone] [email or web address] 03/22/2015, 3:09 PM  LOS: 8 days

## 2015-03-22 NOTE — Progress Notes (Signed)
Inpatient Diabetes Program Recommendations  AACE/ADA: New Consensus Statement on Inpatient Glycemic Control (2013)  Target Ranges:  Prepandial:   less than 140 mg/dL      Peak postprandial:   less than 180 mg/dL (1-2 hours)      Critically ill patients:  140 - 180 mg/dL   Reason for Visit: Hyperglycemia  Results for [name], [name] (MRN [id number]) as of 03/22/2015 12:00  Ref. Range 03/21/2015 07:52 03/21/2015 11:31 03/21/2015 15:44 03/21/2015 21:10 03/22/2015 07:38  Glucose-Capillary Latest Ref Range: 65-99 mg/dL 247 (H) 183 (H) 166 (H) 173 (H) 200 (H)  Results for [name], [name] (MRN [id number]) as of 03/22/2015 12:00  Ref. Range 03/22/2015 08:18  Sodium Latest Ref Range: 135-145 mmol/L 142  Potassium Latest Ref Range: 3.5-5.1 mmol/L 3.9  Chloride Latest Ref Range: 101-111 mmol/L 106  CO2 Latest Ref Range: 22-32 mmol/L 27  BUN Latest Ref Range: 6-20 mg/dL 19  Creatinine Latest Ref Range: 0.61-1.24 mg/dL 0.90  Calcium Latest Ref Range: 8.9-10.3 mg/dL 8.2 (L)  EGFR (Non-African Amer.) Latest Ref Range: >60 mL/min >60  EGFR (African American) Latest Ref Range: >60 mL/min >60  Glucose Latest Ref Range: 65-99 mg/dL 221 (H)  Anion gap Latest Ref Range: 5-15  9   Needs insulin adjustment.  Recommendations: Increase 70/30 to 22 units bid Increase Novolog to moderate tidwc and hs  Will continue to follow. Thank you. Lorenda Peck, RD, LDN, CDE Inpatient Diabetes Coordinator [phone]

## 2015-03-23 DIAGNOSIS — A4901 Methicillin susceptible Staphylococcus aureus infection, unspecified site: Secondary | ICD-10-CM

## 2015-03-23 DIAGNOSIS — R509 Fever, unspecified: Secondary | ICD-10-CM | POA: Diagnosis present

## 2015-03-23 DIAGNOSIS — R609 Edema, unspecified: Secondary | ICD-10-CM | POA: Diagnosis present

## 2015-03-23 LAB — GLUCOSE, CAPILLARY
Glucose-Capillary: 120 mg/dL — ABNORMAL HIGH (ref 65–99)
Glucose-Capillary: 185 mg/dL — ABNORMAL HIGH (ref 65–99)
Glucose-Capillary: 78 mg/dL (ref 65–99)
Glucose-Capillary: 93 mg/dL (ref 65–99)

## 2015-03-23 LAB — NO BLOOD PRODUCTS

## 2015-03-23 MED ORDER — FAMOTIDINE 20 MG PO TABS: 20.0000 mg | ORAL_TABLET | Freq: Two times a day (BID) | ORAL | Status: DC

## 2015-03-23 MED ORDER — HYDROMORPHONE HCL 2 MG/ML IJ SOLN: 2.0000 mg | INTRAMUSCULAR | Status: DC | PRN

## 2015-03-23 MED ORDER — INSULIN ASPART 100 UNIT/ML ~~LOC~~ SOLN: 0.0000 [IU] | Freq: Three times a day (TID) | SUBCUTANEOUS | Status: DC

## 2015-03-23 MED ORDER — DOCUSATE SODIUM 100 MG PO CAPS: 100.0000 mg | ORAL_CAPSULE | Freq: Two times a day (BID) | ORAL | Status: DC | PRN

## 2015-03-23 MED ORDER — ENOXAPARIN SODIUM 150 MG/ML ~~LOC~~ SOLN: 0.5000 mg/kg | SUBCUTANEOUS | Status: DC

## 2015-03-23 MED ORDER — ACETAMINOPHEN 325 MG PO TABS: 650.0000 mg | ORAL_TABLET | Freq: Four times a day (QID) | ORAL | Status: AC | PRN

## 2015-03-23 MED ORDER — INSULIN ASPART PROT & ASPART (70-30 MIX) 100 UNIT/ML ~~LOC~~ SUSP: 20.0000 [IU] | Freq: Two times a day (BID) | SUBCUTANEOUS | Status: DC

## 2015-03-23 MED ORDER — ADULT MULTIVITAMIN W/MINERALS CH: 1.0000 | ORAL_TABLET | Freq: Every day | ORAL | Status: AC

## 2015-03-23 MED ORDER — ONDANSETRON HCL 4 MG PO TABS: 4.0000 mg | ORAL_TABLET | Freq: Four times a day (QID) | ORAL | Status: DC | PRN

## 2015-03-23 MED ORDER — INSULIN ASPART PROT & ASPART (70-30 MIX) 100 UNIT/ML ~~LOC~~ SUSP
22.0000 [IU] | Freq: Two times a day (BID) | SUBCUTANEOUS | Status: DC
  Administered 2015-03-23 – 2015-03-24 (×3): 22 [IU] via SUBCUTANEOUS
  Filled 2015-03-23: qty 10

## 2015-03-23 MED ORDER — HYDRALAZINE HCL 25 MG PO TABS
25.0000 mg | ORAL_TABLET | Freq: Three times a day (TID) | ORAL | Status: DC
  Administered 2015-03-23 – 2015-03-24 (×4): 25 mg via ORAL
  Filled 2015-03-23 (×6): qty 1

## 2015-03-23 MED ORDER — AMPICILLIN-SULBACTAM SODIUM 3 (2-1) G IJ SOLR: 3.0000 g | Freq: Four times a day (QID) | INTRAMUSCULAR | Status: DC

## 2015-03-23 MED ORDER — LINEZOLID 600 MG PO TABS: 600.0000 mg | ORAL_TABLET | Freq: Two times a day (BID) | ORAL | Status: DC

## 2015-03-23 MED ORDER — UNJURY CHOCOLATE CLASSIC POWDER: 2.0000 [oz_av] | Freq: Three times a day (TID) | ORAL | Status: DC

## 2015-03-23 MED ORDER — HYDRALAZINE HCL 25 MG PO TABS: 25.0000 mg | ORAL_TABLET | Freq: Three times a day (TID) | ORAL | Status: DC

## 2015-03-23 MED ORDER — CETYLPYRIDINIUM CHLORIDE 0.05 % MT LIQD: 7.0000 mL | Freq: Two times a day (BID) | OROMUCOSAL | Status: DC

## 2015-03-23 NOTE — Evaluation (Signed)
Occupational Therapy Evaluation Patient Details Name: [name] MRN: [id number] DOB: 02/09/1956 Today's Date: 03/23/2015    History of Present Illness Mr. [name] is a 59 y.o. male with history of diabetes mellitus type 2 chronic right lower extremity ulcer on the leg, degenerative hip presented  to the ER 03/14/15  because of fever and chills which he had over the last 2 days. Patient also noted increasing swelling in his left lower extremity. Patient developed some blisters on his foot on the left side which became ulcers  He has a chronic ulcer on his right shin.Marland Kitchen Hewas admitted for sepsis from cellulitis of the lower extremity with concern for compartment syndrome. 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   Clinical Impression   Pt was admitted for the above.  He will benefit from skilled OT to increase safety and independence with ADLs.  Currently, he is limited by pain and needs A x 2 for bed mobility:  Did not tolerate sitting up at time of eval.  Pt was mod I prior to admission and needs up to total A x 2 for LB adls at this time.  Will focus on sitting tolerance, transfer to 3:1 commode as tolerated and UE exercise.  Will progress to LB dressing when pt is able to move legs by himself.    Follow Up Recommendations  SNF    Equipment Recommendations  Other (comment) (to be further assessed for 3:1)    Recommendations for Other Services       Precautions / Restrictions Precautions Precautions: Fall Precaution Comments: L Leg weeping , una boot on R Restrictions Weight Bearing Restrictions: No      Mobility Bed Mobility Overal bed mobility: Needs Assistance;+2 for physical assistance;+ 2 for safety/equipment Bed Mobility: Rolling Rolling: Max assist;+2 for physical assistance;+2 for safety/equipment         General bed mobility comments: patient does reach for rails , requires assist to roll to the sides, patient on Bedpan, assisted off og the bedpan, spent  extra time repositioning on the bed as c/o legs pressing on the bottom of the  bed. Patient shivering, does not feel well and did not assitst to sitting on the edge of the bed.  Transfers                 General transfer comment: unable to attempt    Balance                                            ADL Overall ADL's : Needs assistance/impaired Eating/Feeding: Sitting;Set up   Grooming: Set up;Sitting   Upper Body Bathing: Set up;Sitting   Lower Body Bathing: Total assistance;+2 for physical assistance;Bed level   Upper Body Dressing : Minimal assistance;Sitting (IV)   Lower Body Dressing: Total assistance;+2 for physical assistance;Bed level                 General ADL Comments: placed bedpan under pt and removed without results.  Pt needs A x 2 to roll, maintain position and place bedpan.  repositioned and pulled up in bed 3x's due to discomfort.  Pt had blue theraband for RUE.  Wil replace with orange.  Instructed to perform elbow extension actively     Vision     Perception     Praxis      Pertinent Vitals/Pain Pain Score:  8  Pain Location: L leg Pain Descriptors / Indicators: Aching;Constant;Cramping;Tightness Pain Intervention(s): Limited activity within patient's tolerance;Patient requesting pain meds-RN notified     Hand Dominance     Extremity/Trunk Assessment Upper Extremity Assessment Upper Extremity Assessment: RUE deficits/detail RUE Deficits / Details: triceps 3-/5; minus approximately 10 extension PROM and 15 AROM; 3+/5 shoulder           Communication Communication Communication: No difficulties   Cognition Arousal/Alertness: Awake/alert Behavior During Therapy: Restless Overall Cognitive Status: Within Functional Limits for tasks assessed                     General Comments       Exercises       Shoulder Instructions      Home Living Family/patient expects to be discharged to:: Unsure                                  Additional Comments: resides at [facility]      Prior Functioning/Environment Level of Independence: Independent with assistive device(s)             OT Diagnosis: Generalized weakness;Acute pain   OT Problem List: Decreased strength;Decreased activity tolerance;Decreased knowledge of use of DME or AE;Pain   OT Treatment/Interventions: Self-care/ADL training;DME and/or AE instruction;Therapeutic exercise;Patient/family education;Therapeutic activities (balance NT)    OT Goals(Current goals can be found in the care plan section) Acute Rehab OT Goals Patient Stated Goal: to get up and walk OT Goal Formulation: With patient Time For Goal Achievement: 04/06/15 Potential to Achieve Goals: Good ADL Goals Pt Will Transfer to Toilet: with mod assist;with +2 assist;stand pivot transfer;squat pivot transfer;bedside commode Additional ADL Goal #1: Pt will get to EOB with mod A x 2 and maintain for UB adls with min guard for 5 minutes Additional ADL Goal #2: pt will be independent with LUE theraband level 2 strengthening program  OT Frequency: Min 2X/week   Barriers to D/C:            Co-evaluation   Reason for Co-Treatment: Complexity of the patient's impairments (multi-system involvement);For patient/therapist safety PT goals addressed during session: Mobility/safety with mobility;Strengthening/ROM        End of Session    Activity Tolerance: No increased pain;Patient limited by fatigue Patient left: in bed;with call bell/phone within reach   Time: 1557-1635 OT Time Calculation (min): 38 min Charges:  OT General Charges $OT Visit: 1 Procedure OT Evaluation $Initial OT Evaluation Tier I: 1 Procedure OT Treatments $Therapeutic Activity: 8-22 mins G-Codes:    Jaymi Tinner 04/10/2015, 5:00 PM  Lesle Chris, OTR/L [phone] Apr 10, 2015

## 2015-03-23 NOTE — Care Management (Signed)
Important Message  Patient Details  Name: [name] MRN: [id number] Date of Birth: 12-31-1955   Medicare Important Message Given:  Yes-second notification given    Shelda Altes, LCSW 03/23/2015, 2:29 PM

## 2015-03-23 NOTE — Progress Notes (Signed)
INFECTIOUS DISEASE PROGRESS NOTE  ID: [name] is a 59 y.o. male with  Principal Problem:   Sepsis Active Problems:   FUO (fever of unknown origin)   Pyomyositis   Cellulitis of left lower extremity   DM (diabetes mellitus), secondary, uncontrolled, with peripheral vascular complications   MSSA (methicillin susceptible Staphylococcus aureus) infection   Leukocytosis   Anemia, iron deficiency   Cellulitis  Subjective: Feels about the same  Abtx:  Anti-infectives    Start     Dose/Rate Route Frequency Ordered Stop   03/23/15 0000  Ampicillin-Sulbactam 3 g in sodium chloride 0.9 % 100 mL     3 g 100 mL/hr over 60 Minutes Intravenous Every 6 hours 03/23/15 1008 03/27/15 2359   03/23/15 0000  linezolid (ZYVOX) 600 MG tablet     600 mg Oral Every 12 hours 03/23/15 1008     03/17/15 1400  Ampicillin-Sulbactam (UNASYN) 3 g in sodium chloride 0.9 % 100 mL IVPB     3 g 100 mL/hr over 60 Minutes Intravenous Every 6 hours 03/17/15 1347     03/17/15 1400  linezolid (ZYVOX) tablet 600 mg     600 mg Oral Every 12 hours 03/17/15 1347     03/16/15 2000  metroNIDAZOLE (FLAGYL) IVPB 500 mg  Status:  Discontinued     500 mg 100 mL/hr over 60 Minutes Intravenous Every 8 hours 03/16/15 1924 03/17/15 1347   03/15/15 1000  vancomycin (VANCOCIN) IVPB 750 mg/150 ml premix  Status:  Discontinued     750 mg 150 mL/hr over 60 Minutes Intravenous Every 12 hours 03/15/15 0106 03/17/15 1347   03/15/15 0400  piperacillin-tazobactam (ZOSYN) IVPB 3.375 g  Status:  Discontinued     3.375 g 12.5 mL/hr over 240 Minutes Intravenous Every 8 hours 03/15/15 0104 03/17/15 1347   03/15/15 0115  vancomycin (VANCOCIN) IVPB 1000 mg/200 mL premix     1,000 mg 200 mL/hr over 60 Minutes Intravenous  Once 03/15/15 0106 03/15/15 0225   03/14/15 2000  vancomycin (VANCOCIN) IVPB 1000 mg/200 mL premix     1,000 mg 200 mL/hr over 60 Minutes Intravenous  Once 03/14/15 1950 03/14/15 2112   03/14/15 2000   piperacillin-tazobactam (ZOSYN) IVPB 3.375 g     3.375 g 100 mL/hr over 30 Minutes Intravenous  Once 03/14/15 1950 03/14/15 2041      Medications:  Scheduled: . sodium chloride   Intravenous Once  . ampicillin-sulbactam (UNASYN) IV  3 g Intravenous Q6H  . antiseptic oral rinse  7 mL Mouth Rinse BID  . aspirin EC  81 mg Oral BH-[id number]  . atorvastatin  80 mg Oral Daily  . enoxaparin (LOVENOX) injection  0.5 mg/kg Subcutaneous Q24H  . ferrous sulfate  325 mg Oral TID WC  . gabapentin  300 mg Oral TID  . hydrALAZINE  25 mg Oral 3 times per day  . insulin aspart  0-9 Units Subcutaneous TID WC  . insulin aspart protamine- aspart  22 Units Subcutaneous BID WC  . linezolid  600 mg Oral Q12H  . mirtazapine  15 mg Oral QHS  . multivitamin with minerals  1 tablet Oral Daily  . oxybutynin  10 mg Oral Daily  . protein supplement  2 oz Oral TID  . tamsulosin  0.4 mg Oral QPC supper    Objective: Vital signs in last 24 hours: Temp:  [98.4 F (36.9 C)-100.7 F (38.2 C)] 99.7 F (37.6 C) (06/30 1533) Pulse Rate:  [91] 91 (06/30 1533) Resp:  [  24-30] 24 (06/30 1533) BP: (178-179)/(81-87) 179/87 mmHg (06/30 1533) SpO2:  [97 %] 97 % (06/30 1533)   General appearance: alert, cooperative and no distress Extremities: no change in erythema, no new blisters. continued tenderness.   Lab Results  Recent Labs  03/22/15 0818  WBC 10.2  HGB 7.5*  HCT 24.1*  NA 142  K 3.9  CL 106  CO2 27  BUN 19  CREATININE 0.90   Liver Panel No results for input(s): PROT, ALBUMIN, AST, ALT, ALKPHOS, BILITOT, BILIDIR, IBILI in the last 72 hours. Sedimentation Rate No results for input(s): ESRSEDRATE in the last 72 hours. C-Reactive Protein No results for input(s): CRP in the last 72 hours.  Microbiology: Recent Results (from the past 240 hour(s))  Urine culture     Status: None   Collection Time: 03/14/15  7:39 PM  Result Value Ref Range Status   Specimen Description URINE, CLEAN CATCH  Final    Special Requests NONE  Final   Culture   Final    MULTIPLE SPECIES PRESENT, SUGGEST RECOLLECTION IF CLINICALLY INDICATED Performed at [facility]    Report Status 03/16/2015 FINAL  Final  Blood culture (routine x 2)     Status: None   Collection Time: 03/14/15  7:55 PM  Result Value Ref Range Status   Specimen Description BLOOD RIGHT FOREARM  Final   Special Requests BOTTLES DRAWN AEROBIC AND ANAEROBIC 5CC  Final   Culture   Final    NO GROWTH 5 DAYS Performed at [facility]    Report Status 03/19/2015 FINAL  Final  Blood culture (routine x 2)     Status: None   Collection Time: 03/14/15  7:55 PM  Result Value Ref Range Status   Specimen Description BLOOD RIGHT ANTECUBITAL  Final   Special Requests BOTTLES DRAWN AEROBIC AND ANAEROBIC 5ML  Final   Culture   Final    NO GROWTH 5 DAYS Performed at [facility]    Report Status 03/19/2015 FINAL  Final  MRSA PCR Screening     Status: None   Collection Time: 03/15/15  9:28 PM  Result Value Ref Range Status   MRSA by PCR NEGATIVE NEGATIVE Final    Comment:        The GeneXpert MRSA Assay (FDA approved for NASAL specimens only), is one component of a comprehensive MRSA colonization surveillance program. It is not intended to diagnose MRSA infection nor to guide or monitor treatment for MRSA infections.   Culture, blood (routine x 2)     Status: None   Collection Time: 03/16/15  7:24 PM  Result Value Ref Range Status   Specimen Description BLOOD BLOOD LEFT ARM  Final   Special Requests BOTTLES DRAWN AEROBIC AND ANAEROBIC 5ML  Final   Culture   Final    NO GROWTH 5 DAYS Performed at [facility]    Report Status 03/21/2015 FINAL  Final  Culture, blood (routine x 2)     Status: None   Collection Time: 03/16/15  8:12 PM  Result Value Ref Range Status   Specimen Description BLOOD BLOOD LEFT WRIST  Final   Special Requests BOTTLES DRAWN AEROBIC AND ANAEROBIC 5ML  Final   Culture   Final    NO  GROWTH 5 DAYS Performed at [facility]    Report Status 03/21/2015 FINAL  Final  Culture, Urine     Status: None   Collection Time: 03/17/15 11:20 AM  Result Value Ref Range  Status   Specimen Description URINE, CLEAN CATCH  Final   Special Requests NONE  Final   Culture   Final    NO GROWTH 1 DAY Performed at [facility]    Report Status 03/18/2015 FINAL  Final    Studies/Results: No results found.   Assessment/Plan: DM2, poorly controlled with complications Diabetic Foot ulcer, chronic venous stasis ulcers Sepsis Cellulitis LLE ? Pyomyositis Fever  Total days of antibiotics: 9 6-24 unasyn/zyvox  Will watch for fever, see if this was isolated event.  Check LLE doppler Needs wound care f/u (I spoke with RN yesterday) PLT stable on zyvox, so far Diabetic control Would aim to continue unasyn/zyvox to 03-27-15. Could change to po if not done by d/c.          [name] Infectious Diseases (pager) [phone] [email or web address] 03/23/2015, 4:00 PM  LOS: 9 days

## 2015-03-23 NOTE — Progress Notes (Signed)
Physical Therapy Treatment Patient Details Name: [name] MRN: [id number] DOB: [birth year] Today's Date: 03/23/2015    History of Present Illness Mr. [name] is a 59 y.o. male with history of diabetes mellitus type 2 chronic right lower extremity ulcer on the leg, degenerative hip presents to the ER 03/14/15  because of fever and chills. Patient states he has been having fever chills over the last 2 days. Patient also over the last 1 week notice increasing swelling in his left lower extremity which is extended proximally. Patient also developed some blisters on his foot on the left side which had denuded to form ulcers. Patient's leg on exam is swollen up to his thigh and tense. Patient has severe pain on trying to move. Patient has sensation and denies any tingling or numbness. Patient's leg is warm to touch up to the foot. Patient also has a chronic ulcer on his right shin. There is no active discharge from this area. X-rays do not reveal any active bony involvement. Patient was febrile and tachycardic and was given fluid bolus. Patient is admitted for sepsis from cellulitis of the lower extremity with concern for compartment syndrome. 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    PT Comments    Patient  Appears to not feel well, c/o shivering and feeling cold. Patient states hi BS was 78.  Patient is also c/o pain of L leg, slides down in the bed when [facility] raised. Requested a trapeze  For bed to  Facilitate patient positioning  In bed. Patient is not strong enough and unable to tolerate sitting well enough to attempt  Standing. RECOMMEND LIFT OOB> RN informed of this.  Follow Up Recommendations  SNF;Supervision/Assistance - 24 hour     Equipment Recommendations  None recommended by PT    Recommendations for Other Services       Precautions / Restrictions Precautions Precautions: Fall Precaution Comments: L Leg weeping , una boot on R    Mobility  Bed Mobility Overal bed  mobility: Needs Assistance;+2 for physical assistance;+ 2 for safety/equipment Bed Mobility: Rolling Rolling: Max assist;+2 for physical assistance;+2 for safety/equipment         General bed mobility comments: patient does reach for rails , requires assist to roll to the sides, patient on Bedpan, assisted off of the bedpan, spent extra time repositioning on the bed as c/o legs pressing on the bottom of the  bed. Patient shivering, does not feel well and did not assist to sitting on the edge of the bed.  Transfers                 General transfer comment: unable to attempt  Ambulation/Gait                 Stairs            Wheelchair Mobility    Modified Rankin (Stroke Patients Only)       Balance                                    Cognition Arousal/Alertness: Awake/alert Behavior During Therapy: Restless                        Exercises      General Comments        Pertinent Vitals/Pain Pain Score: 8  Pain Location: L leg Pain Descriptors / Indicators:  Aching;Constant;Cramping;Tightness Pain Intervention(s): Limited activity within patient's tolerance;Patient requesting pain meds-RN notified    Home Living                      Prior Function            PT Goals (current goals can now be found in the care plan section) Progress towards PT goals: Not progressing toward goals - comment (is having pain, difficulty moving due to fluid in abdomen)    Frequency  Min 3X/week    PT Plan Discharge plan needs to be updated    Co-evaluation PT/OT/SLP Co-Evaluation/Treatment: Yes Reason for Co-Treatment: Complexity of the patient's impairments (multi-system involvement);For patient/therapist safety PT goals addressed during session: Mobility/safety with mobility;Strengthening/ROM       End of Session   Activity Tolerance: Patient limited by pain;Patient limited by fatigue Patient left: in bed;with call  bell/phone within reach     Time: 0356-0434 PT Time Calculation (min) (ACUTE ONLY): 38 min  Charges:  $Therapeutic Activity: 8-22 mins                    G Codes:      [name] 03/23/2015, 4:55 PM [name] PT [phone]

## 2015-03-23 NOTE — Progress Notes (Signed)
Nutrition Follow-up  DOCUMENTATION CODES:  Obesity unspecified  INTERVENTION: - Continue Multivitamin with minerals - Continue Unjury TID, each scoop provides 100 kcal and 21 grams of protein - RD will continue to monitor for needs  NUTRITION DIAGNOSIS:  Increased nutrient needs related to wound healing as evidenced by estimated needs. -ongoing  GOAL:  Patient will meet greater than or equal to 90% of their needs -met on average  MONITOR:  PO intake, Supplement acceptance, Labs, Weight trends, Skin, I & O's  ASSESSMENT: 59 y.o. male who follows in [place] center. Patient has with of diabetes mellitus type 2, chronic right lower extremity ulcer who presented to [facility] ED with fevers, chills and pain and swelling in B/L LE however left more so than the right LE.   Pt ate 100% breakfast and dinner 6/28 and 25% of breakfast 6/27; no other intakes documented since admission. Pt reports he has a fair appetite and sometimes has to force himself to eat. He consumed 50% of peaches and 100% of yogurt so far this AM and still has toast, grits, eggs, and bacon on his tray which he states he plans to consume as best he can.  He denies abdominal pain or nausea after intakes but states some bloating with Unjury. He has stopped drinking them for now because of this but plans to start drinking them again once he is able to move more because he does like them.  Variably meeting needs. Medications reviewed. Labs reviewed; CBGs: 120-247 mg/dL, Ca: 8.2 mg/dL.  Height:  Ht Readings from Last 1 Encounters:  03/14/15 _0  (1.854 m)    Weight:  Wt Readings from Last 1 Encounters:  03/14/15 236 lb (107.049 kg)    Ideal Body Weight:  83.6 kg  Wt Readings from Last 10 Encounters:  03/14/15 236 lb (107.049 kg)    BMI:  Body mass index is 31.14 kg/(m^2).  Estimated Nutritional Needs:  Kcal:  2100-2300  Protein:  100-110g  Fluid:  2.1L/day  Skin:  Wound (see comment) (diabetic ulcer on left leg,  stage II sacral ulcer)  Diet Order:  Diet Carb Modified Fluid consistency:: Thin; Room service appropriate?: Yes  EDUCATION NEEDS:  Education needs addressed   Intake/Output Summary (Last 24 hours) at 03/23/15 0850 Last data filed at 03/23/15 0300  Gross per 24 hour  Intake    340 ml  Output   1650 ml  Net  -1310 ml    Last BM:  6/29    Jarome Matin, RD, LDN Inpatient Clinical Dietitian Pager # [phone] After hours/weekend pager # [phone]

## 2015-03-23 NOTE — Discharge Instructions (Signed)

## 2015-03-23 NOTE — Progress Notes (Signed)
Patient ID: [name], male   DOB: 1956/09/13, 59 y.o.   MRN: [id number] [facility] PROGRESS NOTE  [name] GEX:[id number] DOB: December 29, 1955 DOA: 03/14/2015 PCP: PROVIDER NOT IN SYSTEM - pt follows in [place] center   Brief narrative:    59 y.o. male [name] witness, who follows in [place] center, so we do not have much information in EPIC of his previous medical record. Patient has history of diabetes mellitus type 2, chronic right lower extremity ulcer who presented to [facility] ED with fevers, chills and pain and swelling in B/L LE.  On admission, patient was febrile with tachycardia, tachypnea. No leukocytosis. No evidence of osteomyelitis on x- ray studies. MRI of the left foot showed diffuse cellulitis of the left leg and foot without abscess or osteomyelitis. Lower extremity doppler was negative for DVT. Ortho has seen the patient in consultation and continues to recommend treatment with abx and no surgical intervention.  Patient's hospital course was complicated with ongoing fevers while on IV abx, vancomycin and Zosyn. His blood cultures from the admission were negative. Because he continued to spike fever we added Flagyl on 03/16/2015. This did not seem to help so we consulted infectious disease on 03/17/2015. They recommended changing antibiotics to Zyvox and Unasyn on 03/17/2015. As part of fever workup repeat blood cultures from 03/16/2015 continue to remain negative. Patient was afebrile for 48 hours and then spiked fever this morning, 100.50F.  Barrier to discharge: initially patient was going to go to [place] today but since ID recommended abx only through 03/27/2015 as of this am, pt did not qualify for LTAC. He spiked fever his morning. While he is medically stable to be transferred to telemetry floor today I don't think it's reasonable to send him to skilled nursing facility today as alternative to LTAC because he spiked fever.   Assessment/Plan:    Principal Problem: Sepsis due to  bilateral lower extremity cellulitis / suspected MSSA cellulitis / Leukocytosis / Fever of unknown origin - Sepsis criteria met on admission. Procalcitonin on admission was elevated at 2.65 and lactic acid was elevated at 2.01. Suspected source of infection thought to be MSSA cellulitis or possibly pyomyositis. MRI of the left leg on the admission was significant for cellulitis but no abscess. Patient was started on vancomycin and Zosyn at the time of the admission.Blood cultures collected on admission remain negative to date. - Since patient continued to be septic with ongoing fevers, tachycardia, tachypnea and new leukocytosis we added flagyl on 03/16/2015. He continued to be febrile so we initiated fever of unknown origin workup. Patient had CT abdomen, pelvis, chest done on 03/17/2015 with no clear source of infection identified. Blood cultures obtained 03/16/2015 were also negative to date. - We consulted ID on 03/17/2015 who recommended changing antibiotics to zyvox and unasyn (on 03/17/2015). To make things more complicated, pt again continued to spiek fevers and had repeat MRI of both extremities 03/21/2015 and this showed ongoing cellulitis but no abscess or osteomyelitis. - At this point, infectious disease recommends treating with Zyvox and Unasyn through 03/27/2015.   Active Problems: Bilateral lower extremity edema with LE ulcers - Ortho was consulted on the admission. Since patient clinically improving they do not recommend any surgical intervention. They signed of 03/23/2015. - Left lower extremity doppler was negative for DVT - WOC consulted, appreciate their assessment. The LE ulcers are not pressure ulcers. He has a chronic venous stasis ulcers bilaterally. Right medial malleolus; medial and lateral left malleolus (R medial: 7cm x  4cm x 0.5cm ; L medial: 6cm x 4cm x 0.2cm; L lateral: 14cm x 4cm x 0.2cm). Right medial ulcer is deeper with moderate exudate and Left medial and lateral ulcers  are full thickness. Dressing recommendations: Calcium alginate for exudate management, silver hydrofiber due to chronicity. Right LE wound to be covered with soft silicone foam to accommodate excess exudate.  Diabetes mellitus with peripheral vascular manifestations and neuropathy, uncontrolled - A1c 9.8 indicating poor glycemic control  - Continue novolog 70/30 mix but increase to 22 units BID along with SSI as recommended by diabetic coordinator.  - Metformin on hold due to acute infection  - CBG's in past 24 hours: 199, 167, 120 - Continue gabapentin for neuropathy  Essential hypertension - Added hydralazine but increase from 10 mg to 25 mg every 8 hours for better blood pressure control.  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  Family Communication: plan of care discussed with the patient Disposition Plan: transfer to telemetry floor today.   IV access:  Peripheral IV  Procedures and diagnostic studies:    Mr Tibia Fibula Right Wo Contrast 03/21/2015 LEFT-greater-than- RIGHT leg diffuse subcutaneous edema with mild muscular edema. No abscess. No osteomyelitis. Electronically Signed By: Dereck Ligas M.D. On: 03/21/2015 08:27   Mr Foot Right Wo Contrast 03/21/2015 Negative for osteomyelitis or soft tissue abscess. Diffuse edema of the distal RIGHT leg and ankle which may represent dependent edema or cellulitis in the appropriate clinical setting. Electronically Signed By: Dereck Ligas M.D. On: 03/21/2015 08:33   Mr Tibia Fibula Left Wo Contrast 03/21/2015 Intense subcutaneous edema of the left lower leg most consistent with cellulitis. Negative for abscess or  osteomyelitis. Electronically Signed By: Inge Rise M.D. On: 03/21/2015 08:18   Mr Foot Left Wo Contrast 03/21/2015 ntense subcutaneous edema over the foot compatible with cellulitis and/or dependent change. Negative for osteomyelitis or abscess. Electronically Signed By: Inge Rise M.D. On: 03/21/2015 08:26   Ct Chest W Contrast 03/17/2015 1. Four chamber cardiomegaly with secondary pulmonary lobular interstitial thickening and faint ground-glass opacity suggesting interstitial pulmonary edema. Mild nodal enlargement in the chest could be secondary to passive congestion but is nonspecific. 2. There is subcutaneous edema tracking along the flanks and upper thighs, of uncertain significance. In addition, there is mild adenopathy in the lower pelvis and inguinal regions. Although potentially reactive, into the such as lymphoma are not specifically excluded, and if the palpable Ingle oral abnormality fails to resolve with appropriate therapy, biopsy might be considered. 3. 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 4. Coronary, thoracic aortic, and aortoiliac atherosclerotic calcification. 5. Cervical, thoracic, and lumbar spondylosis with lumbar degenerative disc disease. 6. Multiple duodenal diverticula, none of which appear inflamed. 7. Prominent stool throughout the colon favors constipation. Electronically Signed By: Van Clines M.D. On: 03/17/2015 15:38   Ct Abdomen Pelvis W Contrast 03/17/2015 1. Four chamber cardiomegaly with secondary pulmonary lobular interstitial thickening and faint ground-glass opacity suggesting interstitial pulmonary edema. Mild nodal enlargement in the chest could be secondary to passive congestion but is nonspecific. 2. There is subcutaneous edema tracking along the flanks and upper thighs, of uncertain significance. In addition, there is  mild adenopathy in the lower pelvis and inguinal regions. Although  potentially reactive, into the such as lymphoma are not specifically excluded, and if the palpable Ingle oral abnormality fails to resolve with appropriate therapy, biopsy might be considered. 3. 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 4. Coronary, thoracic aortic, and aortoiliac atherosclerotic calcification. 5. Cervical, thoracic, and lumbar spondylosis with lumbar degenerative disc disease. 6. Multiple duodenal diverticula, none of which appear inflamed. 7. Prominent stool throughout the colon favors constipation.   Dg Chest 2 View 03/14/2015 Low lung volumes with bibasilar opacities favored to represent atelectasis. Cardiac contours upper limits of normal.   Dg Tibia/fibula Right 03/14/2015 Soft tissue ulceration along the medial aspect of the distal tibia. No radiographic findings to suggest osteomyelitis.   Mr Tibia Fibula Left Wo Contrast 03/16/2015 Diffuse cellulitis of the LEFT leg and foot, without abscess or osteomyelitis. Left-greater-than-right leg swelling with a diameter of the LEFT leg about 1.5 times the RIGHT leg.   Mr Foot Left Wo Contrast 03/16/2015 Diffuse cellulitis of the LEFT leg and foot, without abscess or osteomyelitis. Left-greater-than-right leg swelling with a diameter of the LEFT leg about 1.5 times the RIGHT leg.   Dg Foot Complete Left 03/14/2015 Diffuse swelling of the foot without plain film evidence of osteomyelitis.   Medical Consultants:  Orthopedic surgery -signed off 03/23/15 Infectious disease, Dr. Tommy Medal  Other Consultants:  Diabetic coordinator  WOC  IAnti-Infectives:   Vanco 03/14/2015 --> 03/17/2015  Zosyn 03/14/2015 --> 03/17/2015 Flagyl 03/16/2015 --> 03/17/2015 Zyvox 03/17/2015 --> 03/27/2015 Unasyn 03/17/2015 --> 03/27/2015   Leisa Lenz, MD  [facility] Pager [phone]  Time spent in minutes: 25 minutes  If 7PM-7AM, please contact night-coverage [email or web address] Password [email or web address] 03/23/2015,  11:57 AM   LOS: 9 days    HPI/Subjective: No acute overnight events. Patient reports he doesn't feel well this morning. He again spiked fever this am.  Objective: Filed Vitals:   03/23/15 0000 03/23/15 0400 03/23/15 0740 03/23/15 0800  BP:    178/81  Pulse:      Temp: 99.1 F (37.3 C) 99.4 F (37.4 C) 100.7 F (38.2 C)   TempSrc: Oral Oral Oral   Resp:    30  Height:      Weight:      SpO2:        Intake/Output Summary (Last 24 hours) at 03/23/15 1157 Last data filed at 03/23/15 0300  Gross per 24 hour  Intake    310 ml  Output   1650 ml  Net  -1340 ml    Exam:   General:  Pt is alert, not in acute distress  Cardiovascular: Rate controlled, appreciate S1-S2  Respiratory:  bilateral air entry, no wheezing  Abdomen: Nontender abdomen, appreciate bowel sounds  Extremities: extensive bilateral lower extremity edema, left worse than right, pulses palpable   Neuro: no focal neurological deficits   Data Reviewed: Basic Metabolic Panel:  Recent Labs Lab 03/16/15 1735 03/17/15 0345 03/18/15 0356 03/22/15 0818  NA 138 141 136 142  K 3.9 3.7 3.8 3.9  CL 107 110 106 106  CO2 23 22 23 27   GLUCOSE 184* 123* 169* 221*  BUN 18 17 15 19   CREATININE 1.02 1.07 1.13 0.90  CALCIUM 7.9* 8.0* 8.0* 8.2*   Liver Function Tests: No results for input(s): AST, ALT, ALKPHOS, BILITOT, PROT, ALBUMIN in the last 168 hours. No results for input(s): LIPASE, AMYLASE in the last 168 hours. No results for input(s): AMMONIA in the  last 168 hours. CBC:  Recent Labs Lab 03/16/15 1735 03/17/15 0345 03/18/15 0356 03/22/15 0818  WBC 10.8* 10.4 11.7* 10.2  HGB 8.0* 8.1* 8.2* 7.5*  HCT 25.9* 25.4* 25.3* 24.1*  MCV 64.3* 64.5* 64.5* 64.1*  PLT 187 196 202 342   Cardiac Enzymes: No results for input(s): CKTOTAL, CKMB, CKMBINDEX, TROPONINI in the last 168 hours. BNP: Invalid input(s): POCBNP CBG:  Recent Labs Lab 03/22/15 0738 03/22/15 1230 03/22/15 1640 03/22/15 2115  03/23/15 0731  GLUCAP 200* 217* 199* 167* 120*    Recent Results (from the past 240 hour(s))  Urine culture     Status: None   Collection Time: 03/14/15  7:39 PM  Result Value Ref Range Status   Specimen Description URINE, CLEAN CATCH  Final   Special Requests NONE  Final   Culture   Final    MULTIPLE SPECIES PRESENT, SUGGEST RECOLLECTION IF CLINICALLY INDICATED Performed at [facility]    Report Status 03/16/2015 FINAL  Final  Blood culture (routine x 2)     Status: None   Collection Time: 03/14/15  7:55 PM  Result Value Ref Range Status   Specimen Description BLOOD RIGHT FOREARM  Final   Special Requests BOTTLES DRAWN AEROBIC AND ANAEROBIC 5CC  Final   Culture   Final    NO GROWTH 5 DAYS Performed at [facility]    Report Status 03/19/2015 FINAL  Final  Blood culture (routine x 2)     Status: None   Collection Time: 03/14/15  7:55 PM  Result Value Ref Range Status   Specimen Description BLOOD RIGHT ANTECUBITAL  Final   Special Requests BOTTLES DRAWN AEROBIC AND ANAEROBIC 5ML  Final   Culture   Final    NO GROWTH 5 DAYS Performed at [facility]    Report Status 03/19/2015 FINAL  Final  MRSA PCR Screening     Status: None   Collection Time: 03/15/15  9:28 PM  Result Value Ref Range Status   MRSA by PCR NEGATIVE NEGATIVE Final    Comment:        The GeneXpert MRSA Assay (FDA approved for NASAL specimens only), is one component of a comprehensive MRSA colonization surveillance program. It is not intended to diagnose MRSA infection nor to guide or monitor treatment for MRSA infections.   Culture, blood (routine x 2)     Status: None   Collection Time: 03/16/15  7:24 PM  Result Value Ref Range Status   Specimen Description BLOOD BLOOD LEFT ARM  Final   Special Requests BOTTLES DRAWN AEROBIC AND ANAEROBIC 5ML  Final   Culture   Final    NO GROWTH 5 DAYS Performed at [facility]    Report Status 03/21/2015 FINAL  Final  Culture,  blood (routine x 2)     Status: None   Collection Time: 03/16/15  8:12 PM  Result Value Ref Range Status   Specimen Description BLOOD BLOOD LEFT WRIST  Final   Special Requests BOTTLES DRAWN AEROBIC AND ANAEROBIC 5ML  Final   Culture   Final    NO GROWTH 5 DAYS Performed at [facility]    Report Status 03/21/2015 FINAL  Final  Culture, Urine     Status: None   Collection Time: 03/17/15 11:20 AM  Result Value Ref Range Status   Specimen Description URINE, CLEAN CATCH  Final   Special Requests NONE  Final   Culture   Final    NO GROWTH 1  DAY Performed at [facility]    Report Status 03/18/2015 FINAL  Final     Scheduled Meds: . sodium chloride   Intravenous Once  . ampicillin-sulbactam (UNASYN) IV  3 g Intravenous Q6H  . antiseptic oral rinse  7 mL Mouth Rinse BID  . aspirin EC  81 mg Oral BH-q7a  . atorvastatin  80 mg Oral Daily  . enoxaparin (LOVENOX) injection  0.5 mg/kg Subcutaneous Q24H  . ferrous sulfate  325 mg Oral TID WC  . gabapentin  300 mg Oral TID  . hydrALAZINE  10 mg Oral 3 times per day  . insulin aspart  0-9 Units Subcutaneous TID WC  . insulin aspart protamine- aspart  22 Units Subcutaneous BID WC  . linezolid  600 mg Oral Q12H  . mirtazapine  15 mg Oral QHS  . multivitamin with minerals  1 tablet Oral Daily  . oxybutynin  10 mg Oral Daily  . protein supplement  2 oz Oral TID  . tamsulosin  0.4 mg Oral QPC supper

## 2015-03-23 NOTE — Progress Notes (Signed)
Assumed care of Pt who arrived from ICU. Report received by previous nurse. Pt is alert and oriented, no distress, will continue with current plan of care.

## 2015-03-23 NOTE — Progress Notes (Signed)
Subjective: Patient still complains of leg pain   Objective: Vital signs in last 24 hours: Temp:  [98.4 F (36.9 C)-100.7 F (38.2 C)] 100.7 F (38.2 C) (06/30 0740) Pulse Rate:  [76-81] 81 (06/29 1600) Resp:  [19-23] 21 (06/29 1600) BP: (154-180)/(78-84) 180/84 mmHg (06/29 1600) SpO2:  [100 %] 100 % (06/29 1600)  Intake/Output from previous day: 06/29 0701 - 06/30 0700 In: 350 [I.V.:150; IV Piggyback:200] Out: 1650 [Urine:1650] Intake/Output this shift:     Recent Labs  03/22/15 0818  HGB 7.5*    Recent Labs  03/22/15 0818  WBC 10.2  RBC 3.76*  HCT 24.1*  PLT 342    Recent Labs  03/22/15 0818  NA 142  K 3.9  CL 106  CO2 27  BUN 19  CREATININE 0.90  GLUCOSE 221*  CALCIUM 8.2*   No results for input(s): LABPT, INR in the last 72 hours.  Compartment soft Swelling decreased significantly LLE. Erythema also improved  Assessment/Plan: LLE cellulitis- Continues to improve. WBC normalized yesterday. No need for surgical intervention. Will sign off   Safir Michalec V 03/23/2015, 7:54 AM

## 2015-03-23 NOTE — Care Management Note (Signed)
Case Management Note  Patient Details  Name: [name] MRN: [id number] Date of Birth: [birth year]     Expected Discharge Date:   (unknown)               Expected Discharge Plan:  [place]  In-House Referral:  Clinical Social Work  Discharge planning Services  CM Consult, Medication Assistance  Post Acute Care Choice:    Choice offered to:     DME Arranged:    DME Agency:     HH Arranged:    [place] Agency:     Status of Service:  In process, will continue to follow  Medicare Important Message Given:  Yes-second notification given Date Medicare IM Given:    Medicare IM give by:    Date Additional Medicare IM Given:    Additional Medicare Important Message give by:     If discussed at [place] of Stay Meetings, dates discussed:    Additional Comments: Per [facility] rep pt does not qualify for LTAC at this time due to not having enough need. Per ID pt only will need IV abx until 03/27/15. CSW to speak with pt about SNF placement. Lynnell Catalan, RN 03/23/2015, 1:30 PM

## 2015-03-23 NOTE — Progress Notes (Signed)
CSW met with pt to assist with d/c planning. Pt is not appropriate for LTAC at this time. Pt is in agreement with SNF placement at d/c. SNF search initiated in [place] and surrounding counties. Bed offers pending. CSW will continue to follow to assist with d/c planning.  Werner Lean LCSW [phone]

## 2015-03-24 ENCOUNTER — Inpatient Hospital Stay (HOSPITAL_COMMUNITY): Payer: Medicare Other

## 2015-03-24 DIAGNOSIS — R509 Fever, unspecified: Secondary | ICD-10-CM

## 2015-03-24 LAB — CBC WITH DIFFERENTIAL/PLATELET
Basophils Absolute: 0 10*3/uL (ref 0.0–0.1)
Basophils Relative: 0 % (ref 0–1)
Eosinophils Absolute: 0.1 10*3/uL (ref 0.0–0.7)
Eosinophils Relative: 1 % (ref 0–5)
HCT: 25.5 % — ABNORMAL LOW (ref 39.0–52.0)
HEMOGLOBIN: 8 g/dL — AB (ref 13.0–17.0)
LYMPHS ABS: 2 10*3/uL (ref 0.7–4.0)
Lymphocytes Relative: 16 % (ref 12–46)
MCH: 20.2 pg — ABNORMAL LOW (ref 26.0–34.0)
MCHC: 31.4 g/dL (ref 30.0–36.0)
MCV: 64.2 fL — AB (ref 78.0–100.0)
MONOS PCT: 7 % (ref 3–12)
Monocytes Absolute: 0.9 10*3/uL (ref 0.1–1.0)
Neutro Abs: 9.2 10*3/uL — ABNORMAL HIGH (ref 1.7–7.7)
Neutrophils Relative %: 76 % (ref 43–77)
Platelets: 413 10*3/uL — ABNORMAL HIGH (ref 150–400)
RBC: 3.97 MIL/uL — AB (ref 4.22–5.81)
RDW: 16.4 % — ABNORMAL HIGH (ref 11.5–15.5)
WBC: 12.2 10*3/uL — AB (ref 4.0–10.5)

## 2015-03-24 LAB — GLUCOSE, CAPILLARY
GLUCOSE-CAPILLARY: 134 mg/dL — AB (ref 65–99)
GLUCOSE-CAPILLARY: 151 mg/dL — AB (ref 65–99)
Glucose-Capillary: 101 mg/dL — ABNORMAL HIGH (ref 65–99)

## 2015-03-24 MED ORDER — INSULIN ASPART PROT & ASPART (70-30 MIX) 100 UNIT/ML ~~LOC~~ SUSP: 21.0000 [IU] | Freq: Two times a day (BID) | SUBCUTANEOUS | Status: DC

## 2015-03-24 MED ORDER — AMOXICILLIN-POT CLAVULANATE 875-125 MG PO TABS: 1.0000 | ORAL_TABLET | Freq: Two times a day (BID) | ORAL | Status: DC

## 2015-03-24 MED ORDER — HYDROCODONE-ACETAMINOPHEN 5-325 MG PO TABS: 1.0000 | ORAL_TABLET | Freq: Three times a day (TID) | ORAL | Status: DC | PRN

## 2015-03-24 MED ORDER — LINEZOLID 600 MG PO TABS: ORAL_TABLET | ORAL | Status: DC

## 2015-03-24 MED ORDER — FUROSEMIDE 10 MG/ML IJ SOLN
20.0000 mg | Freq: Once | INTRAMUSCULAR | Status: AC
  Administered 2015-03-24: 20 mg via INTRAVENOUS
  Filled 2015-03-24: qty 2

## 2015-03-24 NOTE — Progress Notes (Signed)
Clinical Social Work  CSW met with patient at bedside to explain bed offers. Per MD, patient will DC today. Patient reports he wants to talk with [place] housing representative prior to making a decision. CSW agreeable to follow up in a couple of hours. Patient understands that he will need to choose facility today due to DC.  [place], [place] [phone]

## 2015-03-24 NOTE — Discharge Summary (Addendum)
Discharge Summary  [name] HQP:[id number] DOB: 1956-04-05  PCP: Charlsie Merles, MD   Admit date: 03/14/2015 Discharge date: 03/24/2015  Time spent: 25 minutes  Recommendations for Outpatient Follow-up:  1.  New medication: Augmentin 875 mg by mouth twice a day until 7/4 2. New medication: Zyvox 600 mg by mouth twice a day until 7/4 3. Patient being discharged to short-term skilled nursing 4. Wound care:Dressing recommendations: Calcium alginate for exudate management, silver hydrofiber due to chronicity. Right LE wound to be covered with soft silicone foam to accommodate excess exudate. 5.  medication change: Hydralazine increased to from 10 mg to 25 mg every 8 hours  Discharge Diagnoses:  Active Hospital Problems   Diagnosis Date Noted  . Sepsis 03/14/2015  . Pressure ulcer 03/24/2015  . Edema   . Fever   . DM (diabetes mellitus), secondary, uncontrolled, with peripheral vascular complications 59/93/5701  . MSSA (methicillin susceptible Staphylococcus aureus) infection 03/20/2015  . Leukocytosis 03/20/2015  . Anemia, iron deficiency 03/20/2015  . Cellulitis   . Cellulitis of left lower extremity   . FUO (fever of unknown origin)   . Pyomyositis     Resolved Hospital Problems   Diagnosis Date Noted Date Resolved  . Blood poisoning  03/20/2015  . Type 1 diabetes mellitus with ketoacidosis, uncontrolled  03/20/2015  . Pressure ulcer 03/15/2015 03/20/2015  . Secondary diabetes with peripheral vascular disease  03/20/2015  . Cellulitis of both lower extremities 03/14/2015 03/20/2015  . Diabetes mellitus type 2, controlled 03/14/2015 03/20/2015  . Microcytic anemia 03/14/2015 03/20/2015    Discharge Condition: improved, being discharged to short-term skilled nursing  Diet recommendation: carb modified low sodium  Filed Weights   03/14/15 2252  Weight: 107.049 kg (236 lb)    History of present illness:  59 year old male past history of diabetes mellitus  admitted on 6/21 with signs consistent with sepsis secondary to a left foot cellulitis.  Hospital Course:  Principal Problem:   Sepsis secondary to cellulitis of left lower extremity: Patient admitted criteria for sepsis on admission given elevated lactic acid level of 2.01, Pro calcitonin at 2.65 with source thought to be MSSA cellulitis versus pyomyositis. MRI of left leg noted no signs of abscess just diffuse infection. Patient said on vancomycin and Zosyn. Blood cultures done, but were negative to date. Patient remained septic despite broad-spectrum antibiotics with persistent fevers, tachycardia and worsening leukocytosis. Flagyl added and he remained afebrile. Infectious disease consulted. CT of chest abdomen and pelvis done noting no signs of infection repeat blood cultures done on 6/23 also remained negative. A box changed to Zyvox and Unasyn on 6/24 and patient despite this continued to spike fevers of a white count continued to improve. Repeat MRI on 6/28 of lower extremities noted ongoing cellulitis but no abscess or osteomyelitis. At this point, infectious disease recommended continuing treatment with Zyvox and Unasyn. Patient has had some low-grade temperatures, but no true fever spikes since 6/28. White blood cell count has been normal. With these findings, patient felt medically stable and safe for discharge.  Left lower artery Doppler negative for DVT. Active Problems:    DM (diabetes mellitus), secondary, uncontrolled, with peripheral vascular complications: X7L at 9.8 noting poor glycemic control. He was continued on NovoLog 70/30 but increase to 22 units twice a day and his CBGs have slightly trended downward. At increased 70/30-21 units twice a day on discharg medication change:   Essential hypertension: Hydralazine increased from 10 mg to 25 mg every 8 hours for better  blood pressure control.e  Ulcers: Seen by wound care. Patient does not have pressure ulcers, but rather these are  chronic venous stasis ulcers. Wound care: Dressing recommendations: Calcium alginate for exudate management, silver hydrofiber due to chronicity. Right LE wound to be covered with soft silicone foam to accommodate excess exudate.    Anemia, iron deficiency: Stable. Hemoglobin came below 7.5, but on by day of discharge was up to 8 without intervention  Procedures:  None  Consultations:  Orthopedics  Wound care  Diabetes coordinator  Infectious disease  Discharge Exam: BP 156/73 mmHg  Pulse 72  Temp(Src) 99.9 F (37.7 C) (Oral)  Resp 22  Ht 6\' 1"  (1.854 m)  Wt 107.049 kg (236 lb)  BMI 31.14 kg/m2  SpO2 94%  General: Alert and oriented 3, no acute distress Cardiovascular: regular rate and rhythm, S1-S2 Respiratory: clear to auscultation bilaterally extremities: Bilateral lower extremities wrapped  Discharge Instructions You were cared for by a hospitalist during your hospital stay. If you have any questions about your discharge medications or the care you received while you were in the hospital after you are discharged, you can call the unit and asked to speak with the hospitalist on call if the hospitalist that took care of you is not available. Once you are discharged, your primary care physician will handle any further medical issues. Please note that NO REFILLS for any discharge medications will be authorized once you are discharged, as it is imperative that you return to your primary care physician (or establish a relationship with a primary care physician if you do not have one) for your aftercare needs so that they can reassess your need for medications and monitor your lab values.  Discharge Instructions    Call MD for:  difficulty breathing, headache or visual disturbances    Complete by:  As directed      Call MD for:  persistant nausea and vomiting    Complete by:  As directed      Call MD for:  severe uncontrolled pain    Complete by:  As directed      Diet -  low sodium heart healthy    Complete by:  As directed      Discharge instructions    Complete by:  As directed   1. Continue Zyvox and Unasyn through 03/27/2015 per infectious disease recommendations.     Increase activity slowly    Complete by:  As directed             Medication List    STOP taking these medications        hydrophilic ointment     metFORMIN 1000 MG tablet  Commonly known as:  GLUCOPHAGE     omeprazole 20 MG capsule  Commonly known as:  PRILOSEC      TAKE these medications        acetaminophen 325 MG tablet  Commonly known as:  TYLENOL  Take 2 tablets (650 mg total) by mouth every 6 (six) hours as needed for mild pain (or Fever >/= 101).     amoxicillin-clavulanate 875-125 MG per tablet  Commonly known as:  AUGMENTIN  Take 1 tablet by mouth 2 (two) times daily.     aspirin EC 81 MG tablet  Take 81 mg by mouth every morning.     atorvastatin 80 MG tablet  Commonly known as:  LIPITOR  Take 80 mg by mouth daily.     docusate sodium 100 MG capsule  Commonly  known as:  COLACE  Take 1 capsule (100 mg total) by mouth 2 (two) times daily as needed for mild constipation.     enoxaparin 150 MG/ML injection  Commonly known as:  LOVENOX  Inject 0.36 mLs (55 mg total) into the skin daily.     famotidine 20 MG tablet  Commonly known as:  PEPCID  Take 1 tablet (20 mg total) by mouth 2 (two) times daily.     ferrous sulfate 325 (65 FE) MG tablet  Take 325 mg by mouth 3 (three) times daily with meals.     gabapentin 300 MG capsule  Commonly known as:  NEURONTIN  Take 300 mg by mouth 3 (three) times daily.     hydrALAZINE 25 MG tablet  Commonly known as:  APRESOLINE  Take 1 tablet (25 mg total) by mouth every 8 (eight) hours.     HYDROcodone-acetaminophen 5-325 MG per tablet  Commonly known as:  NORCO/VICODIN  Take 1 tablet by mouth every 8 (eight) hours as needed for moderate pain.     ibuprofen 800 MG tablet  Commonly known as:  ADVIL,MOTRIN    Take 800 mg by mouth every 8 (eight) hours as needed for fever or moderate pain.     insulin aspart 100 UNIT/ML injection  Commonly known as:  novoLOG  Inject 0-9 Units into the skin 3 (three) times daily with meals.     insulin aspart protamine- aspart (70-30) 100 UNIT/ML injection  Commonly known as:  NOVOLOG MIX 70/30  Inject 0.21 mLs (21 Units total) into the skin 2 (two) times daily with a meal.     linezolid 600 MG tablet  Commonly known as:  ZYVOX  1 tab po BID x 4 days.  Stop after last dose 7/4     meloxicam 7.5 MG tablet  Commonly known as:  MOBIC  Take 7.5 mg by mouth 2 (two) times daily.     mirtazapine 15 MG tablet  Commonly known as:  REMERON  Take 15 mg by mouth at bedtime.     multivitamin with minerals Tabs tablet  Take 1 tablet by mouth daily.     ondansetron 4 MG tablet  Commonly known as:  ZOFRAN  Take 1 tablet (4 mg total) by mouth every 6 (six) hours as needed for nausea.     oxybutynin 10 MG 24 hr tablet  Commonly known as:  DITROPAN-XL  Take 10 mg by mouth at bedtime.     PROMOGRAN EX  Apply 1 application topically every 3 (three) days. Apply dressing every three days     protein supplement Powd  Commonly known as:  UNJURY CHOCOLATE CLASSIC  Take 7 g (2 oz total) by mouth 3 (three) times daily.     tamsulosin 0.4 MG Caps capsule  Commonly known as:  FLOMAX  Take 0.4 mg by mouth daily after supper.     TRIXAICIN 0.025 % cream  Generic drug:  capsicum oleoresin  Apply 1 application topically 2 (two) times daily.        Allergies  Allergen Reactions  . Other     Pt is a Jehov[employer] Witness. No blood products.  . Sulfa Antibiotics     Causes flu-like symptom : sweating, chills, fever, body aches      The results of significant diagnostics from this hospitalization (including imaging, microbiology, ancillary and laboratory) are listed below for reference.    Significant Diagnostic Studies: Dg Chest 2 View  03/14/2015   CLINICAL DATA:  Patient with fever for 2 days. Bilateral lower extremity wounds.  EXAM: CHEST  2 VIEW  COMPARISON:  None.  FINDINGS: Monitoring leads overlie the patient. Low lung volumes. Cardiac and mediastinal contours upper limits of normal. Elevation right hemidiaphragm. Bibasilar heterogeneous opacities. No pleural effusion or pneumothorax. Lateral view nondiagnostic due to overlapping soft tissue.  IMPRESSION: Low lung volumes with bibasilar opacities favored to represent atelectasis.  Cardiac contours upper limits of normal.   Electronically Signed   By: Lovey Newcomer M.D.   On: 03/14/2015 21:07   Dg Tibia/fibula Right  03/14/2015   CLINICAL DATA:  Bilateral lower extremity wounds, fever  EXAM: RIGHT TIBIA AND FIBULA - 2 VIEW  COMPARISON:  None.  FINDINGS: Soft tissue ulceration along the medial aspect of the distal tibia.  No radiographic findings to suggest osteomyelitis.  No fracture or dislocation is seen.  Degenerative changes of the knee.  Soft tissue swelling along the medial ankle.  IMPRESSION: Soft tissue ulceration along the medial aspect of the distal tibia. No radiographic findings to suggest osteomyelitis.   Electronically Signed   By: Julian Hy M.D.   On: 03/14/2015 21:04   Ct Chest W Contrast  03/17/2015   CLINICAL DATA:  Chronic right lower extremity ulceration. Fever and chills. Pain. Lower extremity swelling. Fever of unknown origin. Diabetes.  EXAM: CT CHEST, ABDOMEN, AND PELVIS WITH CONTRAST  TECHNIQUE: Multidetector CT imaging of the chest, abdomen and pelvis was performed following the standard protocol during bolus administration of intravenous contrast.  CONTRAST:  160mL OMNIPAQUE IOHEXOL 300 MG/ML  SOLN  COMPARISON:  Multiple exams, including 03/14/2015  FINDINGS: CT CHEST FINDINGS  Mediastinum/Nodes: Borderline prominent right paratracheal lymph nodes. Small bilateral axillary lymph nodes are observed. A right hilar lymph node measures 1.2 cm in short axis, mildly prominent. An AP  window lymph node is also mildly prominent at 1.2 cm.  Atherosclerotic calcification of the aortic arch and left anterior descending coronary artery. Mild cardiomegaly. This involves all 4 chambers.  Small bilateral pleural effusions with associated passive atelectasis. Small epicardial lymph nodes are present.  Lungs/Pleura: Secondary pulmonary interstitial lobular thickening at the lung apices. Mild diffuse interstitial accentuation with some faint ground-glass opacities in the lower lobes. No definite nodularity or abnormal enhancement along the pleural margins to indicate specificity for exudative effusion.  Musculoskeletal: Lower cervical and lower thoracic spondylosis.  CT ABDOMEN PELVIS FINDINGS  Hepatobiliary: Small gallstones are visible in the gallbladder neck. No biliary dilatation.  Pancreas: Unremarkable  Spleen: Unremarkable  Adrenals/Urinary Tract: Unremarkable  Stomach/Bowel: Periampullary duodenal diverticula do not appear inflamed. There is a larger transverse duodenal diverticulum measuring 4.5 cm in diameter.  Appendix not well seen. Prominent stool throughout the colon favors constipation.  Vascular/Lymphatic: Aortoiliac atherosclerotic vascular disease. Bilateral prominent inguinal lymph nodes with a left upper inguinal lymph node measuring 1.8 cm in short axis (image 112, series 2,) and a left lower right inguinal lymph node also measuring 1.8 cm in short axis (image 128, series 2). A right external iliac node measures 1.4 cm in short axis, image 111 of series 2. Small retroperitoneal lymph nodes are present.  Reproductive: Unremarkable  Other: There is subcutaneous edema tracking along both flanks and overlying both hips and extending into the upper thighs, of uncertain significance.  Musculoskeletal: Markedly severe degenerative arthropathy of the left hip. Moderate to severe degenerative arthropathy of the right hip. Lumbar spondylosis and degenerative disc disease noted with disc bulge  most notable at the L3-4 level.  IMPRESSION:  1. Four chamber cardiomegaly with secondary pulmonary lobular interstitial thickening and faint ground-glass opacity suggesting interstitial pulmonary edema. Mild nodal enlargement in the chest could be secondary to passive congestion but is nonspecific. 2. There is subcutaneous edema tracking along the flanks and upper thighs, of uncertain significance. In addition, there is mild adenopathy in the lower pelvis and inguinal regions. Although potentially reactive, into the such as lymphoma are not specifically excluded, and if the palpable Ingle oral abnormality fails to resolve with appropriate therapy, biopsy might be considered. 3. 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 4. Coronary, thoracic aortic, and aortoiliac atherosclerotic calcification. 5. Cervical, thoracic, and lumbar spondylosis with lumbar degenerative disc disease. 6. Multiple duodenal diverticula, none of which appear inflamed. 7.  Prominent stool throughout the colon favors constipation.   Electronically Signed   By: Van Clines M.D.   On: 03/17/2015 15:38   Ct Abdomen Pelvis W Contrast  03/17/2015   CLINICAL DATA:  Chronic right lower extremity ulceration. Fever and chills. Pain. Lower extremity swelling. Fever of unknown origin. Diabetes.  EXAM: CT CHEST, ABDOMEN, AND PELVIS WITH CONTRAST  TECHNIQUE: Multidetector CT imaging of the chest, abdomen and pelvis was performed following the standard protocol during bolus administration of intravenous contrast.  CONTRAST:  111mL OMNIPAQUE IOHEXOL 300 MG/ML  SOLN  COMPARISON:  Multiple exams, including 03/14/2015  FINDINGS: CT CHEST FINDINGS  Mediastinum/Nodes: Borderline prominent right paratracheal lymph nodes. Small bilateral axillary lymph nodes are observed. A right hilar lymph node measures 1.2 cm in short axis, mildly prominent. An AP window lymph node is also mildly prominent at 1.2 cm.  Atherosclerotic  calcification of the aortic arch and left anterior descending coronary artery. Mild cardiomegaly. This involves all 4 chambers.  Small bilateral pleural effusions with associated passive atelectasis. Small epicardial lymph nodes are present.  Lungs/Pleura: Secondary pulmonary interstitial lobular thickening at the lung apices. Mild diffuse interstitial accentuation with some faint ground-glass opacities in the lower lobes. No definite nodularity or abnormal enhancement along the pleural margins to indicate specificity for exudative effusion.  Musculoskeletal: Lower cervical and lower thoracic spondylosis.  CT ABDOMEN PELVIS FINDINGS  Hepatobiliary: Small gallstones are visible in the gallbladder neck. No biliary dilatation.  Pancreas: Unremarkable  Spleen: Unremarkable  Adrenals/Urinary Tract: Unremarkable  Stomach/Bowel: Periampullary duodenal diverticula do not appear inflamed. There is a larger transverse duodenal diverticulum measuring 4.5 cm in diameter.  Appendix not well seen. Prominent stool throughout the colon favors constipation.  Vascular/Lymphatic: Aortoiliac atherosclerotic vascular disease. Bilateral prominent inguinal lymph nodes with a left upper inguinal lymph node measuring 1.8 cm in short axis (image 112, series 2,) and a left lower right inguinal lymph node also measuring 1.8 cm in short axis (image 128, series 2). A right external iliac node measures 1.4 cm in short axis, image 111 of series 2. Small retroperitoneal lymph nodes are present.  Reproductive: Unremarkable  Other: There is subcutaneous edema tracking along both flanks and overlying both hips and extending into the upper thighs, of uncertain significance.  Musculoskeletal: Markedly severe degenerative arthropathy of the left hip. Moderate to severe degenerative arthropathy of the right hip. Lumbar spondylosis and degenerative disc disease noted with disc bulge most notable at the L3-4 level.  IMPRESSION: 1. Four chamber cardiomegaly  with secondary pulmonary lobular interstitial thickening and faint ground-glass opacity suggesting interstitial pulmonary edema. Mild nodal enlargement in the chest could be secondary to passive congestion but is nonspecific. 2. There is subcutaneous edema tracking along the flanks and  upper thighs, of uncertain significance. In addition, there is mild adenopathy in the lower pelvis and inguinal regions. Although potentially reactive, into the such as lymphoma are not specifically excluded, and if the palpable Ingle oral abnormality fails to resolve with appropriate therapy, biopsy might be considered. 3. 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 4. Coronary, thoracic aortic, and aortoiliac atherosclerotic calcification. 5. Cervical, thoracic, and lumbar spondylosis with lumbar degenerative disc disease. 6. Multiple duodenal diverticula, none of which appear inflamed. 7.  Prominent stool throughout the colon favors constipation.   Electronically Signed   By: Van Clines M.D.   On: 03/17/2015 15:38   Mr Tibia Fibula Right Wo Contrast  03/21/2015   CLINICAL DATA:  Type 2 diabetes. Chronic RIGHT lower extremity ulcer. Fevers, chills and pain. Bilateral lower extremity swelling. LEFT leg swelling. Evaluate for osteomyelitis or abscess.  EXAM: MRI OF LOWER RIGHT EXTREMITY WITHOUT CONTRAST  TECHNIQUE: Multiplanar, multisequence MR imaging of the LEFT leg was performed. No intravenous contrast was administered.  COMPARISON:  03/14/2015.  FINDINGS: There is swelling of the LEFT leg diffusely. This predominantly involves the subcutaneous tissues. Achilles tendon and ankle tendons appear within normal limits.  Diffuse subcutaneous edema is present in both legs, LEFT-greater-than-RIGHT without focal fluid collection to suggest abscess.  Incidental visualization of the contralateral RIGHT leg demonstrates atrophy of the RIGHT peroneus longus muscle.  Tibia and fibula marrow signal is  normal. Negative for osteomyelitis. Cutaneous blistering is present in the anterior LEFT leg, probably associated with soft tissue infection.  IMPRESSION: LEFT-greater-than- RIGHT leg diffuse subcutaneous edema with mild muscular edema. No abscess. No osteomyelitis.   Electronically Signed   By: Dereck Ligas M.D.   On: 03/21/2015 08:27   Mr Foot Right Wo Contrast  03/21/2015   CLINICAL DATA:  Diffuse lower extremity edema. Type 2 diabetic with foot and leg ulceration. LEFT-greater-than- RIGHT leg swelling. Initial encounter.  EXAM: MRI OF THE RIGHT FOOT WITHOUT CONTRAST  TECHNIQUE: Multiplanar, multisequence MR imaging was performed. No intravenous contrast was administered.  COMPARISON:  None.  FINDINGS: Field of view extends from the distal leg to the midshaft metatarsals.  No osteomyelitis in the hindfoot or ankle. Diffuse edema is present in Kager's fat pad. The Achilles tendon appears within normal limits. Mild ankle osteoarthritis is present with small osteochondral lesion of the talar dome. Posterior medial and posterior lateral tendon groups appear normal. Anterior tendon group is normal. No tenosynovitis. No evidence of septic arthritis. Small ankle effusion.  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 Negative for osteomyelitis. Mild midfoot osteoarthritis without neuropathic changes. No soft tissue abscess is identified. There appears to be blistering in the skin of the medial distal leg.  IMPRESSION: Negative for osteomyelitis or soft tissue abscess. Diffuse edema of the distal RIGHT leg and ankle which may represent dependent edema or cellulitis in the appropriate clinical setting.   Electronically Signed   By: Dereck Ligas M.D.   On: 03/21/2015 08:33   Mr Tibia Fibula Left Wo Contrast  03/21/2015   CLINICAL DATA:  Diabetic patient with lower extremity swelling, fever, chills and pain, left worse than right. Subsequent encounter.  EXAM: MRI OF LOWER LEFT EXTREMITY WITHOUT  CONTRAST  TECHNIQUE: Multiplanar, multisequence MR imaging of the left lower leg was performed. No intravenous contrast was administered.  COMPARISON:  MRI left lower leg 03/16/2015.  FINDINGS: Imaging incidentally includes the right lower leg. Intense subcutaneous edema in the left lower leg is again seen  and not notably changed compared to the prior MRI of. Subcutaneous edema is worse on the left compared to the right. No focal fluid collection is identified. Image lower leg musculature demonstrates some fatty atrophy. No intramuscular fluid collection or evidence of myositis is present. Bone marrow signal is normal.  IMPRESSION: Intense subcutaneous edema of the left lower leg most consistent with cellulitis. Negative for abscess or osteomyelitis.   Electronically Signed   By: Inge Rise M.D.   On: 03/21/2015 08:18   Mr Tibia Fibula Left Wo Contrast  03/16/2015   CLINICAL DATA:  Osteomyelitis of the foot and leg. Lateral foot ulcerations. Compartment syndrome.  EXAM: MRI OF THE LEFT FOREFOOT WITHOUT CONTRAST; MRI OF LOWER LEFT EXTREMITY WITHOUT CONTRAST  TECHNIQUE: Multiplanar, multisequence MR imaging was performed. No intravenous contrast was administered.  COMPARISON:  03/14/2015.  FINDINGS: LEFT foot: Diffuse edema is present over the dorsum of the foot. There is no discrete abscess or osteomyelitis. Flexor and extensor tendons appear within normal limits.  LEFT leg: Diffuse subcutaneous edema is present in both legs, greater on the LEFT when compared to the RIGHT. Bone marrow signal is normal. Mild edema is present in the medial head of the gastrocnemius, probably reactive. Flexor and extensor tendons around the ankle appear normal. Neurovascular bundles are within normal limits. No abscess.  IMPRESSION: Diffuse cellulitis of the LEFT leg and foot, without abscess or osteomyelitis. Left-greater-than-right leg swelling with a diameter of the LEFT leg about 1.5 times the RIGHT leg.   Electronically  Signed   By: Dereck Ligas M.D.   On: 03/16/2015 08:42   Mr Foot Left Wo Contrast  03/21/2015   CLINICAL DATA:  Diabetic patient with lower extremity swelling, fever, chills and pain, left worse than right. Subsequent encounter.  EXAM: MRI OF THE LEFT FOREFOOT WITHOUT CONTRAST  TECHNIQUE: Multiplanar, multisequence MR imaging was performed. No intravenous contrast was administered.  COMPARISON:  MRI left foot 03/16/2015.  FINDINGS: Marked subcutaneous edema over the dorsum of the foot does not appear notably changed. No focal fluid collection is identified. There is no bone marrow signal abnormality to suggest osteomyelitis. Intrinsic musculature of the foot demonstrates some fatty atrophy. All visualized tendons are intact and unremarkable appearance. Joint spaces are preserved. No joint effusion is identified.  IMPRESSION: Intense subcutaneous edema over the foot compatible with cellulitis and/or dependent change. Negative for osteomyelitis or abscess.   Electronically Signed   By: Inge Rise M.D.   On: 03/21/2015 08:26   Mr Foot Left Wo Contrast  03/16/2015   CLINICAL DATA:  Osteomyelitis of the foot and leg. Lateral foot ulcerations. Compartment syndrome.  EXAM: MRI OF THE LEFT FOREFOOT WITHOUT CONTRAST; MRI OF LOWER LEFT EXTREMITY WITHOUT CONTRAST  TECHNIQUE: Multiplanar, multisequence MR imaging was performed. No intravenous contrast was administered.  COMPARISON:  03/14/2015.  FINDINGS: LEFT foot: Diffuse edema is present over the dorsum of the foot. There is no discrete abscess or osteomyelitis. Flexor and extensor tendons appear within normal limits.  LEFT leg: Diffuse subcutaneous edema is present in both legs, greater on the LEFT when compared to the RIGHT. Bone marrow signal is normal. Mild edema is present in the medial head of the gastrocnemius, probably reactive. Flexor and extensor tendons around the ankle appear normal. Neurovascular bundles are within normal limits. No abscess.   IMPRESSION: Diffuse cellulitis of the LEFT leg and foot, without abscess or osteomyelitis. Left-greater-than-right leg swelling with a diameter of the LEFT leg about 1.5 times the RIGHT leg.  Electronically Signed   By: Dereck Ligas M.D.   On: 03/16/2015 08:42   Dg Foot Complete Left  03/14/2015   CLINICAL DATA:  .Bilateral lower extremity wounds with swelling. Symptoms for 2 weeks. LEFT foot ulcerations.  EXAM: LEFT FOOT - COMPLETE 3+ VIEW  COMPARISON:  None.  FINDINGS: Osteopenia. No plain film evidence of osteomyelitis. Radiopaque density is present over the medial ankle, likely representing wound dressing. No gas in the soft tissues of the foot. Diffuse forefoot soft tissue swelling. Anatomic alignment. Negative for fracture.  IMPRESSION: Diffuse swelling of the foot without plain film evidence of osteomyelitis.   Electronically Signed   By: Dereck Ligas M.D.   On: 03/14/2015 21:08    Microbiology: Recent Results (from the past 240 hour(s))  Urine culture     Status: None   Collection Time: 03/14/15  7:39 PM  Result Value Ref Range Status   Specimen Description URINE, CLEAN CATCH  Final   Special Requests NONE  Final   Culture   Final    MULTIPLE SPECIES PRESENT, SUGGEST RECOLLECTION IF CLINICALLY INDICATED Performed at [facility]    Report Status 03/16/2015 FINAL  Final  Blood culture (routine x 2)     Status: None   Collection Time: 03/14/15  7:55 PM  Result Value Ref Range Status   Specimen Description BLOOD RIGHT FOREARM  Final   Special Requests BOTTLES DRAWN AEROBIC AND ANAEROBIC 5CC  Final   Culture   Final    NO GROWTH 5 DAYS Performed at [facility]    Report Status 03/19/2015 FINAL  Final  Blood culture (routine x 2)     Status: None   Collection Time: 03/14/15  7:55 PM  Result Value Ref Range Status   Specimen Description BLOOD RIGHT ANTECUBITAL  Final   Special Requests BOTTLES DRAWN AEROBIC AND ANAEROBIC 5ML  Final   Culture   Final    NO  GROWTH 5 DAYS Performed at [facility]    Report Status 03/19/2015 FINAL  Final  MRSA PCR Screening     Status: None   Collection Time: 03/15/15  9:28 PM  Result Value Ref Range Status   MRSA by PCR NEGATIVE NEGATIVE Final    Comment:        The GeneXpert MRSA Assay (FDA approved for NASAL specimens only), is one component of a comprehensive MRSA colonization surveillance program. It is not intended to diagnose MRSA infection nor to guide or monitor treatment for MRSA infections.   Culture, blood (routine x 2)     Status: None   Collection Time: 03/16/15  7:24 PM  Result Value Ref Range Status   Specimen Description BLOOD BLOOD LEFT ARM  Final   Special Requests BOTTLES DRAWN AEROBIC AND ANAEROBIC 5ML  Final   Culture   Final    NO GROWTH 5 DAYS Performed at [facility]    Report Status 03/21/2015 FINAL  Final  Culture, blood (routine x 2)     Status: None   Collection Time: 03/16/15  8:12 PM  Result Value Ref Range Status   Specimen Description BLOOD BLOOD LEFT WRIST  Final   Special Requests BOTTLES DRAWN AEROBIC AND ANAEROBIC 5ML  Final   Culture   Final    NO GROWTH 5 DAYS Performed at [facility]    Report Status 03/21/2015 FINAL  Final  Culture, Urine     Status: None   Collection Time: 03/17/15 11:20 AM  Result  Value Ref Range Status   Specimen Description URINE, CLEAN CATCH  Final   Special Requests NONE  Final   Culture   Final    NO GROWTH 1 DAY Performed at [facility]    Report Status 03/18/2015 FINAL  Final     Labs: Basic Metabolic Panel:  Recent Labs Lab 03/18/15 0356 03/22/15 0818  NA 136 142  K 3.8 3.9  CL 106 106  CO2 23 27  GLUCOSE 169* 221*  BUN 15 19  CREATININE 1.13 0.90  CALCIUM 8.0* 8.2*   Liver Function Tests: No results for input(s): AST, ALT, ALKPHOS, BILITOT, PROT, ALBUMIN in the last 168 hours. No results for input(s): LIPASE, AMYLASE in the last 168 hours. No results for input(s):  AMMONIA in the last 168 hours. CBC:  Recent Labs Lab 03/18/15 0356 03/22/15 0818 03/24/15 0502  WBC 11.7* 10.2 12.2*  NEUTROABS  --   --  9.2*  HGB 8.2* 7.5* 8.0*  HCT 25.3* 24.1* 25.5*  MCV 64.5* 64.1* 64.2*  PLT 202 342 413*   Cardiac Enzymes: No results for input(s): CKTOTAL, CKMB, CKMBINDEX, TROPONINI in the last 168 hours. BNP: BNP (last 3 results) No results for input(s): BNP in the last 8760 hours.  ProBNP (last 3 results) No results for input(s): PROBNP in the last 8760 hours.  CBG:  Recent Labs Lab 03/23/15 1216 03/23/15 1530 03/23/15 2122 03/24/15 0741 03/24/15 1211  GLUCAP 185* 78 93 101* 134*       Signed:  Myelle Poteat K  [facility] 03/24/2015, 2:36 PM

## 2015-03-24 NOTE — Progress Notes (Signed)
Report called to [facility] LPN at [facility]. Discharge instructions accompanied pt, left the unit in stable condition via ambulance to Nursing facility.

## 2015-03-24 NOTE — Progress Notes (Signed)
Clinical Social Work  CSW spoke with patient who wants to go to [facility] for DC. CSW faxed DC summary and SNF agreeable to accept. CSW prepared DC packet with FL2, DC summary and hard scripts included. Patient prefers [facility] for transportation. Patient is happy to get rehab and wants to get into permanent housing via [place] when completed.   [facility] arranged, request #: [id number].  CSW is signing off but available if needed.  [place], [place] [phone]

## 2015-03-24 NOTE — Progress Notes (Signed)
VASCULAR LAB PRELIMINARY  PRELIMINARY  PRELIMINARY  PRELIMINARY  Left lower extremity venous duplex completed.    Preliminary report:  Bilateral:  No obvious evidence of DVT, superficial thrombosis, or Baker's Cyst.  Technically limited by edema and pain.    Jeshurun [place], RVT 03/24/2015, 1:10 PM

## 2015-03-24 NOTE — Clinical Social Work Placement (Signed)
   CLINICAL SOCIAL WORK PLACEMENT  NOTE  Date:  03/24/2015  Patient Details  Name: [name] MRN: [id number] Date of Birth: 31-Aug-1956  Clinical Social Work is seeking post-discharge placement for this patient at the [place] level of care (*CSW will initial, date and re-position this form in  chart as items are completed):  Yes   Patient/family provided with [place] Work Department's list of facilities offering this level of care within the geographic area requested by the patient (or if unable, by the patient's family).  Yes   Patient/family informed of their freedom to choose among providers that offer the needed level of care, that participate in Medicare, Medicaid or managed care program needed by the patient, have an available bed and are willing to accept the patient.  Yes   Patient/family informed of [place]'s ownership interest in [facility] and [facility], as well as of the fact that they are under no obligation to receive care at these facilities.  PASRR submitted to EDS on       PASRR number received on       Existing PASRR number confirmed on 03/24/15     FL2 transmitted to all facilities in geographic area requested by pt/family on 03/24/15     FL2 transmitted to all facilities within larger geographic area on       Patient informed that his/her managed care company has contracts with or will negotiate with certain facilities, including the following:        Yes   Patient/family informed of bed offers received.  Patient chooses bed at [facility]     Physician recommends and patient chooses bed at      Patient to be transferred to [facility] on 03/24/15.  Patient to be transferred to facility by [facility]     Patient family notified on 03/24/15 of transfer.  Name of family member notified:  Patient reports he will inform friends and family     PHYSICIAN       Additional Comment:     _______________________________________________ Boone Master, [place] 03/24/2015, 4:54 PM

## 2015-03-24 NOTE — Progress Notes (Signed)
INFECTIOUS DISEASE PROGRESS NOTE  ID: [name] is a 59 y.o. male with  Principal Problem:   Sepsis Active Problems:   FUO (fever of unknown origin)   Pyomyositis   Cellulitis of left lower extremity   DM (diabetes mellitus), secondary, uncontrolled, with peripheral vascular complications   MSSA (methicillin susceptible Staphylococcus aureus) infection   Leukocytosis   Anemia, iron deficiency   Cellulitis   Edema   Fever  Subjective: Without complaints  Abtx:  Anti-infectives    Start     Dose/Rate Route Frequency Ordered Stop   03/24/15 0000  linezolid (ZYVOX) 600 MG tablet        03/24/15 1431     03/24/15 0000  amoxicillin-clavulanate (AUGMENTIN) 875-125 MG per tablet     1 tablet Oral 2 times daily 03/24/15 1435 03/27/15 2359   03/23/15 0000  Ampicillin-Sulbactam 3 g in sodium chloride 0.9 % 100 mL  Status:  Discontinued     3 g 100 mL/hr over 60 Minutes Intravenous Every 6 hours 03/23/15 1008 03/24/15    03/23/15 0000  linezolid (ZYVOX) 600 MG tablet  Status:  Discontinued     600 mg Oral Every 12 hours 03/23/15 1008 03/24/15    03/17/15 1400  Ampicillin-Sulbactam (UNASYN) 3 g in sodium chloride 0.9 % 100 mL IVPB     3 g 100 mL/hr over 60 Minutes Intravenous Every 6 hours 03/17/15 1347     03/17/15 1400  linezolid (ZYVOX) tablet 600 mg     600 mg Oral Every 12 hours 03/17/15 1347     03/16/15 2000  metroNIDAZOLE (FLAGYL) IVPB 500 mg  Status:  Discontinued     500 mg 100 mL/hr over 60 Minutes Intravenous Every 8 hours 03/16/15 1924 03/17/15 1347   03/15/15 1000  vancomycin (VANCOCIN) IVPB 750 mg/150 ml premix  Status:  Discontinued     750 mg 150 mL/hr over 60 Minutes Intravenous Every 12 hours 03/15/15 0106 03/17/15 1347   03/15/15 0400  piperacillin-tazobactam (ZOSYN) IVPB 3.375 g  Status:  Discontinued     3.375 g 12.5 mL/hr over 240 Minutes Intravenous Every 8 hours 03/15/15 0104 03/17/15 1347   03/15/15 0115  vancomycin (VANCOCIN) IVPB 1000 mg/200 mL  premix     1,000 mg 200 mL/hr over 60 Minutes Intravenous  Once 03/15/15 0106 03/15/15 0225   03/14/15 2000  vancomycin (VANCOCIN) IVPB 1000 mg/200 mL premix     1,000 mg 200 mL/hr over 60 Minutes Intravenous  Once 03/14/15 1950 03/14/15 2112   03/14/15 2000  piperacillin-tazobactam (ZOSYN) IVPB 3.375 g     3.375 g 100 mL/hr over 30 Minutes Intravenous  Once 03/14/15 1950 03/14/15 2041      Medications:  Scheduled: . sodium chloride   Intravenous Once  . ampicillin-sulbactam (UNASYN) IV  3 g Intravenous Q6H  . antiseptic oral rinse  7 mL Mouth Rinse BID  . aspirin EC  81 mg Oral [id number]  . atorvastatin  80 mg Oral Daily  . enoxaparin (LOVENOX) injection  0.5 mg/kg Subcutaneous Q24H  . ferrous sulfate  325 mg Oral TID WC  . furosemide  20 mg Intravenous Once  . gabapentin  300 mg Oral TID  . hydrALAZINE  25 mg Oral 3 times per day  . insulin aspart  0-9 Units Subcutaneous TID WC  . insulin aspart protamine- aspart  22 Units Subcutaneous BID WC  . linezolid  600 mg Oral Q12H  . mirtazapine  15 mg Oral QHS  . multivitamin  with minerals  1 tablet Oral Daily  . oxybutynin  10 mg Oral Daily  . protein supplement  2 oz Oral TID  . tamsulosin  0.4 mg Oral QPC supper    Objective: Vital signs in last 24 hours: Temp:  [99.4 F (37.4 C)-100.4 F (38 C)] 99.4 F (37.4 C) (07/01 1434) Pulse Rate:  [72-101] 88 (07/01 1434) Resp:  [22-28] 24 (07/01 1434) BP: (156-180)/(73-87) 180/82 mmHg (07/01 1434) SpO2:  [91 %-97 %] 91 % (07/01 1434)   General appearance: alert, cooperative and no distress Extremities: LLE is unchanged, swollen, tender, erythematous.   Lab Results  Recent Labs  03/22/15 0818 03/24/15 0502  WBC 10.2 12.2*  HGB 7.5* 8.0*  HCT 24.1* 25.5*  NA 142  --   K 3.9  --   CL 106  --   CO2 27  --   BUN 19  --   CREATININE 0.90  --    Liver Panel No results for input(s): PROT, ALBUMIN, AST, ALT, ALKPHOS, BILITOT, BILIDIR, IBILI in the last 72  hours. Sedimentation Rate No results for input(s): ESRSEDRATE in the last 72 hours. C-Reactive Protein No results for input(s): CRP in the last 72 hours.  Microbiology: Recent Results (from the past 240 hour(s))  Urine culture     Status: None   Collection Time: 03/14/15  7:39 PM  Result Value Ref Range Status   Specimen Description URINE, CLEAN CATCH  Final   Special Requests NONE  Final   Culture   Final    MULTIPLE SPECIES PRESENT, SUGGEST RECOLLECTION IF CLINICALLY INDICATED Performed at [facility]    Report Status 03/16/2015 FINAL  Final  Blood culture (routine x 2)     Status: None   Collection Time: 03/14/15  7:55 PM  Result Value Ref Range Status   Specimen Description BLOOD RIGHT FOREARM  Final   Special Requests BOTTLES DRAWN AEROBIC AND ANAEROBIC 5CC  Final   Culture   Final    NO GROWTH 5 DAYS Performed at [facility]    Report Status 03/19/2015 FINAL  Final  Blood culture (routine x 2)     Status: None   Collection Time: 03/14/15  7:55 PM  Result Value Ref Range Status   Specimen Description BLOOD RIGHT ANTECUBITAL  Final   Special Requests BOTTLES DRAWN AEROBIC AND ANAEROBIC 5ML  Final   Culture   Final    NO GROWTH 5 DAYS Performed at [facility]    Report Status 03/19/2015 FINAL  Final  MRSA PCR Screening     Status: None   Collection Time: 03/15/15  9:28 PM  Result Value Ref Range Status   MRSA by PCR NEGATIVE NEGATIVE Final    Comment:        The GeneXpert MRSA Assay (FDA approved for NASAL specimens only), is one component of a comprehensive MRSA colonization surveillance program. It is not intended to diagnose MRSA infection nor to guide or monitor treatment for MRSA infections.   Culture, blood (routine x 2)     Status: None   Collection Time: 03/16/15  7:24 PM  Result Value Ref Range Status   Specimen Description BLOOD BLOOD LEFT ARM  Final   Special Requests BOTTLES DRAWN AEROBIC AND ANAEROBIC 5ML  Final    Culture   Final    NO GROWTH 5 DAYS Performed at [facility]    Report Status 03/21/2015 FINAL  Final  Culture, blood (routine x 2)  Status: None   Collection Time: 03/16/15  8:12 PM  Result Value Ref Range Status   Specimen Description BLOOD BLOOD LEFT WRIST  Final   Special Requests BOTTLES DRAWN AEROBIC AND ANAEROBIC 5ML  Final   Culture   Final    NO GROWTH 5 DAYS Performed at [facility]    Report Status 03/21/2015 FINAL  Final  Culture, Urine     Status: None   Collection Time: 03/17/15 11:20 AM  Result Value Ref Range Status   Specimen Description URINE, CLEAN CATCH  Final   Special Requests NONE  Final   Culture   Final    NO GROWTH 1 DAY Performed at [facility]    Report Status 03/18/2015 FINAL  Final    Studies/Results: No results found.  Assessment/Plan: DM2, poorly controlled with complications Diabetic Foot ulcer, chronic venous stasis ulcers Sepsis Cellulitis LLE ? Pyomyositis Fever  Total days of antibiotics: 10 6-24 unasyn/zyvox  Aim for zyvox, augmentin til 7-4 She can go home with these as PO. Plt are stable  WILL NEED CLOSE WOUND F/U My great appreciation to Dr Genoveva Ill Infectious Diseases (pager) [phone] [email or web address] 03/24/2015, 3:08 PM  LOS: 10 days

## 2015-03-26 ENCOUNTER — Emergency Department (HOSPITAL_COMMUNITY): Payer: Medicare Other

## 2015-03-26 ENCOUNTER — Encounter (HOSPITAL_COMMUNITY): Payer: Self-pay | Admitting: Emergency Medicine

## 2015-03-26 ENCOUNTER — Inpatient Hospital Stay (HOSPITAL_COMMUNITY)
Admission: EM | Admit: 2015-03-26 | Discharge: 2015-04-04 | DRG: 638 | Disposition: A | Discharge status: Skilled Nursing Facility | Payer: Medicare Other | Attending: Internal Medicine | Admitting: Internal Medicine

## 2015-03-26 DIAGNOSIS — Z833 Family history of diabetes mellitus: Secondary | ICD-10-CM | POA: Diagnosis not present

## 2015-03-26 DIAGNOSIS — L97811 Non-pressure chronic ulcer of other part of right lower leg limited to breakdown of skin: Secondary | ICD-10-CM

## 2015-03-26 DIAGNOSIS — L03119 Cellulitis of unspecified part of limb: Secondary | ICD-10-CM | POA: Insufficient documentation

## 2015-03-26 DIAGNOSIS — L03116 Cellulitis of left lower limb: Secondary | ICD-10-CM | POA: Diagnosis present

## 2015-03-26 DIAGNOSIS — D72829 Elevated white blood cell count, unspecified: Secondary | ICD-10-CM | POA: Diagnosis present

## 2015-03-26 DIAGNOSIS — F17210 Nicotine dependence, cigarettes, uncomplicated: Secondary | ICD-10-CM | POA: Diagnosis present

## 2015-03-26 DIAGNOSIS — E11621 Type 2 diabetes mellitus with foot ulcer: Secondary | ICD-10-CM | POA: Diagnosis present

## 2015-03-26 DIAGNOSIS — D509 Iron deficiency anemia, unspecified: Secondary | ICD-10-CM | POA: Diagnosis present

## 2015-03-26 DIAGNOSIS — F329 Major depressive disorder, single episode, unspecified: Secondary | ICD-10-CM | POA: Diagnosis present

## 2015-03-26 DIAGNOSIS — E1165 Type 2 diabetes mellitus with hyperglycemia: Secondary | ICD-10-CM | POA: Diagnosis present

## 2015-03-26 DIAGNOSIS — I10 Essential (primary) hypertension: Secondary | ICD-10-CM | POA: Diagnosis present

## 2015-03-26 DIAGNOSIS — K219 Gastro-esophageal reflux disease without esophagitis: Secondary | ICD-10-CM | POA: Diagnosis present

## 2015-03-26 DIAGNOSIS — E1351 Other specified diabetes mellitus with diabetic peripheral angiopathy without gangrene: Secondary | ICD-10-CM | POA: Diagnosis present

## 2015-03-26 DIAGNOSIS — L02419 Cutaneous abscess of limb, unspecified: Secondary | ICD-10-CM | POA: Insufficient documentation

## 2015-03-26 DIAGNOSIS — E1151 Type 2 diabetes mellitus with diabetic peripheral angiopathy without gangrene: Secondary | ICD-10-CM | POA: Diagnosis present

## 2015-03-26 DIAGNOSIS — A419 Sepsis, unspecified organism: Secondary | ICD-10-CM

## 2015-03-26 DIAGNOSIS — I83008 Varicose veins of unspecified lower extremity with ulcer other part of lower leg: Secondary | ICD-10-CM

## 2015-03-26 DIAGNOSIS — L97509 Non-pressure chronic ulcer of other part of unspecified foot with unspecified severity: Secondary | ICD-10-CM

## 2015-03-26 DIAGNOSIS — R0902 Hypoxemia: Secondary | ICD-10-CM | POA: Diagnosis present

## 2015-03-26 DIAGNOSIS — L97821 Non-pressure chronic ulcer of other part of left lower leg limited to breakdown of skin: Secondary | ICD-10-CM

## 2015-03-26 DIAGNOSIS — E119 Type 2 diabetes mellitus without complications: Secondary | ICD-10-CM | POA: Diagnosis present

## 2015-03-26 DIAGNOSIS — L0390 Cellulitis, unspecified: Secondary | ICD-10-CM | POA: Diagnosis present

## 2015-03-26 DIAGNOSIS — E11628 Type 2 diabetes mellitus with other skin complications: Principal | ICD-10-CM | POA: Diagnosis present

## 2015-03-26 DIAGNOSIS — E1365 Other specified diabetes mellitus with hyperglycemia: Secondary | ICD-10-CM

## 2015-03-26 DIAGNOSIS — B9689 Other specified bacterial agents as the cause of diseases classified elsewhere: Secondary | ICD-10-CM

## 2015-03-26 LAB — URINALYSIS, ROUTINE W REFLEX MICROSCOPIC
Bilirubin Urine: NEGATIVE
Glucose, UA: NEGATIVE mg/dL
Hgb urine dipstick: NEGATIVE
Ketones, ur: NEGATIVE mg/dL
Leukocytes, UA: NEGATIVE
NITRITE: NEGATIVE
PROTEIN: NEGATIVE mg/dL
Specific Gravity, Urine: 1.006 (ref 1.005–1.030)
Urobilinogen, UA: 0.2 mg/dL (ref 0.0–1.0)
pH: 7.5 (ref 5.0–8.0)

## 2015-03-26 LAB — BASIC METABOLIC PANEL
Anion gap: 10 (ref 5–15)
BUN: 10 mg/dL (ref 6–20)
CO2: 26 mmol/L (ref 22–32)
CREATININE: 0.64 mg/dL (ref 0.61–1.24)
Calcium: 8.3 mg/dL — ABNORMAL LOW (ref 8.9–10.3)
Chloride: 100 mmol/L — ABNORMAL LOW (ref 101–111)
GFR calc Af Amer: >60 mL/min (ref >60)
Glucose, Bld: 152 mg/dL — ABNORMAL HIGH (ref 65–99)
POTASSIUM: 4 mmol/L (ref 3.5–5.1)
Sodium: 136 mmol/L (ref 135–145)

## 2015-03-26 LAB — CBC WITH DIFFERENTIAL/PLATELET
BASOS ABS: 0 10*3/uL (ref 0.0–0.1)
Basophils Relative: 0 % (ref 0–1)
EOS PCT: 1 % (ref 0–5)
Eosinophils Absolute: 0.1 10*3/uL (ref 0.0–0.7)
HCT: 25.1 % — ABNORMAL LOW (ref 39.0–52.0)
Hemoglobin: 7.8 g/dL — ABNORMAL LOW (ref 13.0–17.0)
LYMPHS PCT: 13 % (ref 12–46)
Lymphs Abs: 1.6 10*3/uL (ref 0.7–4.0)
MCH: 19.7 pg — ABNORMAL LOW (ref 26.0–34.0)
MCHC: 31.1 g/dL (ref 30.0–36.0)
MCV: 63.4 fL — ABNORMAL LOW (ref 78.0–100.0)
MONO ABS: 1.4 10*3/uL — AB (ref 0.1–1.0)
Monocytes Relative: 11 % (ref 3–12)
Neutro Abs: 9.2 10*3/uL — ABNORMAL HIGH (ref 1.7–7.7)
Neutrophils Relative %: 75 % (ref 43–77)
Platelets: 431 10*3/uL — ABNORMAL HIGH (ref 150–400)
RBC: 3.96 MIL/uL — ABNORMAL LOW (ref 4.22–5.81)
RDW: 16.4 % — AB (ref 11.5–15.5)
WBC: 12.3 10*3/uL — AB (ref 4.0–10.5)

## 2015-03-26 LAB — GLUCOSE, CAPILLARY
GLUCOSE-CAPILLARY: 160 mg/dL — AB (ref 65–99)
Glucose-Capillary: 172 mg/dL — ABNORMAL HIGH (ref 65–99)
Glucose-Capillary: 163 mg/dL — ABNORMAL HIGH (ref 65–99)

## 2015-03-26 LAB — I-STAT CG4 LACTIC ACID, ED: Lactic Acid, Venous: 0.89 mmol/L (ref 0.5–2.0)

## 2015-03-26 LAB — CK: CK TOTAL: 104 U/L (ref 49–397)

## 2015-03-26 MED ORDER — HYDROMORPHONE HCL 1 MG/ML IJ SOLN
1.0000 mg | Freq: Once | INTRAMUSCULAR | Status: AC
  Administered 2015-03-26: 1 mg via INTRAVENOUS
  Filled 2015-03-26: qty 1

## 2015-03-26 MED ORDER — INSULIN ASPART PROT & ASPART (70-30 MIX) 100 UNIT/ML ~~LOC~~ SUSP
21.0000 [IU] | Freq: Two times a day (BID) | SUBCUTANEOUS | Status: DC
  Administered 2015-03-26 – 2015-03-30 (×10): 21 [IU] via SUBCUTANEOUS
  Filled 2015-03-26 (×2): qty 10

## 2015-03-26 MED ORDER — ACETAMINOPHEN 325 MG PO TABS
650.0000 mg | ORAL_TABLET | Freq: Four times a day (QID) | ORAL | Status: DC | PRN
Start: 2015-03-26 — End: 2015-04-04

## 2015-03-26 MED ORDER — HYDRALAZINE HCL 25 MG PO TABS
25.0000 mg | ORAL_TABLET | Freq: Three times a day (TID) | ORAL | Status: DC
  Administered 2015-03-26 – 2015-03-28 (×7): 25 mg via ORAL
  Filled 2015-03-26 (×10): qty 1

## 2015-03-26 MED ORDER — HYDROMORPHONE HCL 1 MG/ML IJ SOLN
1.0000 mg | INTRAMUSCULAR | Status: DC | PRN
  Administered 2015-03-28 – 2015-04-04 (×23): 1 mg via INTRAVENOUS
  Filled 2015-03-26 (×24): qty 1

## 2015-03-26 MED ORDER — MIRTAZAPINE 15 MG PO TABS
15.0000 mg | ORAL_TABLET | Freq: Every day | ORAL | Status: DC
  Administered 2015-03-26 – 2015-04-03 (×9): 15 mg via ORAL
  Filled 2015-03-26 (×10): qty 1

## 2015-03-26 MED ORDER — OXYBUTYNIN CHLORIDE ER 10 MG PO TB24
10.0000 mg | ORAL_TABLET | Freq: Every day | ORAL | Status: DC
  Administered 2015-03-26 – 2015-04-03 (×9): 10 mg via ORAL
  Filled 2015-03-26 (×10): qty 1

## 2015-03-26 MED ORDER — SODIUM CHLORIDE 0.9 % IV SOLN
500.0000 mg | Freq: Once | INTRAVENOUS | Status: AC
  Administered 2015-03-26: 500 mg via INTRAVENOUS
  Filled 2015-03-26: qty 500

## 2015-03-26 MED ORDER — LISINOPRIL 10 MG PO TABS
10.0000 mg | ORAL_TABLET | Freq: Every day | ORAL | Status: DC
  Administered 2015-03-26 – 2015-04-04 (×10): 10 mg via ORAL
  Filled 2015-03-26 (×10): qty 1

## 2015-03-26 MED ORDER — GABAPENTIN 300 MG PO CAPS
300.0000 mg | ORAL_CAPSULE | Freq: Three times a day (TID) | ORAL | Status: DC
  Administered 2015-03-26 – 2015-04-04 (×29): 300 mg via ORAL
  Filled 2015-03-26 (×30): qty 1

## 2015-03-26 MED ORDER — OXYCODONE-ACETAMINOPHEN 5-325 MG PO TABS
1.0000 | ORAL_TABLET | ORAL | Status: DC | PRN
  Administered 2015-03-26 – 2015-04-04 (×26): 2 via ORAL
  Filled 2015-03-26 (×27): qty 2

## 2015-03-26 MED ORDER — IMIPENEM-CILASTATIN 500 MG IV SOLR
500.0000 mg | Freq: Four times a day (QID) | INTRAVENOUS | Status: DC
  Administered 2015-03-26 – 2015-04-03 (×34): 500 mg via INTRAVENOUS
  Filled 2015-03-26 (×34): qty 500

## 2015-03-26 MED ORDER — ASPIRIN EC 81 MG PO TBEC
81.0000 mg | DELAYED_RELEASE_TABLET | Freq: Every day | ORAL | Status: DC
  Administered 2015-03-26 – 2015-04-04 (×10): 81 mg via ORAL
  Filled 2015-03-26 (×10): qty 1

## 2015-03-26 MED ORDER — ENOXAPARIN SODIUM 60 MG/0.6ML ~~LOC~~ SOLN
0.5000 mg/kg | SUBCUTANEOUS | Status: DC
  Administered 2015-03-26 – 2015-04-04 (×10): 55 mg via SUBCUTANEOUS
  Filled 2015-03-26 (×10): qty 0.6

## 2015-03-26 MED ORDER — HEPARIN SODIUM (PORCINE) 5000 UNIT/ML IJ SOLN
5000.0000 [IU] | Freq: Three times a day (TID) | INTRAMUSCULAR | Status: DC
  Filled 2015-03-26 (×4): qty 1

## 2015-03-26 MED ORDER — ONDANSETRON HCL 4 MG/2ML IJ SOLN
4.0000 mg | Freq: Once | INTRAMUSCULAR | Status: AC
  Administered 2015-03-26: 4 mg via INTRAVENOUS
  Filled 2015-03-26: qty 2

## 2015-03-26 MED ORDER — DOCUSATE SODIUM 100 MG PO CAPS: 100.0000 mg | ORAL_CAPSULE | Freq: Two times a day (BID) | ORAL | Status: DC | PRN

## 2015-03-26 MED ORDER — FAMOTIDINE 20 MG PO TABS
20.0000 mg | ORAL_TABLET | Freq: Two times a day (BID) | ORAL | Status: DC
  Administered 2015-03-26 – 2015-04-04 (×19): 20 mg via ORAL
  Filled 2015-03-26 (×20): qty 1

## 2015-03-26 MED ORDER — MELOXICAM 7.5 MG PO TABS
7.5000 mg | ORAL_TABLET | Freq: Two times a day (BID) | ORAL | Status: DC
  Administered 2015-03-26 – 2015-04-04 (×19): 7.5 mg via ORAL
  Filled 2015-03-26 (×22): qty 1

## 2015-03-26 MED ORDER — INSULIN ASPART 100 UNIT/ML ~~LOC~~ SOLN
0.0000 [IU] | Freq: Three times a day (TID) | SUBCUTANEOUS | Status: DC
  Administered 2015-03-26 (×3): 2 [IU] via SUBCUTANEOUS

## 2015-03-26 MED ORDER — SODIUM CHLORIDE 0.9 % IV SOLN
640.0000 mg | INTRAVENOUS | Status: DC
  Administered 2015-03-26 – 2015-04-03 (×9): 640 mg via INTRAVENOUS
  Filled 2015-03-26 (×10): qty 12.8

## 2015-03-26 MED ORDER — UNJURY CHOCOLATE CLASSIC POWDER
2.0000 [oz_av] | Freq: Three times a day (TID) | ORAL | Status: DC
  Administered 2015-03-26 – 2015-04-01 (×19): 2 [oz_av] via ORAL
  Filled 2015-03-26 (×30): qty 27

## 2015-03-26 MED ORDER — IBUPROFEN 800 MG PO TABS
800.0000 mg | ORAL_TABLET | Freq: Three times a day (TID) | ORAL | Status: DC | PRN
  Administered 2015-03-26: 800 mg via ORAL
  Filled 2015-03-26 (×2): qty 1

## 2015-03-26 MED ORDER — HYDROMORPHONE HCL 1 MG/ML IJ SOLN: 1.0000 mg | INTRAMUSCULAR | Status: DC | PRN

## 2015-03-26 MED ORDER — TAMSULOSIN HCL 0.4 MG PO CAPS
0.4000 mg | ORAL_CAPSULE | Freq: Every day | ORAL | Status: DC
  Administered 2015-03-26 – 2015-04-04 (×10): 0.4 mg via ORAL
  Filled 2015-03-26 (×11): qty 1

## 2015-03-26 MED ORDER — ONDANSETRON HCL 4 MG PO TABS: 4.0000 mg | ORAL_TABLET | Freq: Four times a day (QID) | ORAL | Status: DC | PRN

## 2015-03-26 MED ORDER — ATORVASTATIN CALCIUM 80 MG PO TABS
80.0000 mg | ORAL_TABLET | Freq: Every day | ORAL | Status: DC
  Administered 2015-03-26 – 2015-04-04 (×10): 80 mg via ORAL
  Filled 2015-03-26 (×10): qty 1

## 2015-03-26 NOTE — ED Notes (Signed)
Per EMS pt has been being treated for a leg infection and was discharged from here 2 days ago  Pt has been on antibiotics and has been at [facility]  Pt states his leg is hurting worse tonight  Pt has hx of osteoporosis  Pt states he had decreased appetite as well

## 2015-03-26 NOTE — Consult Note (Signed)
[facility]  Date of Admission:  03/26/2015  Date of Consult:  03/26/2015  Reason for Consult: Cellulitis LE Referring Physician: Alcario Drought  Impression/Recommendation DM2, poorly controlled with complications Diabetic Foot ulcer, chronic venous stasis ulcers Sepsis Cellulitis LLE ? Pyomyositis  would F/u CK while on dapto Wound care f/u Repeat imaging- MRI? Diabetic control Surgery eval?  Thank you so much for this interesting consult,   [name] (pager) [phone] [email or web address]  [name] is an 59 y.o. male.  HPI: 59 yo M with Dm2 and diabetic foot ulcers and chronic venous stasis ulcers. He was hospitalized from 6-21 to 7-1 and treated with linezolid/augmentin. He had MRI in hospital that did not show abscess. He was d/c to SNF on same. Now returns on 7-3 with increased pain, increased drainage from his wounds. States he had temp 101.5.  He was changed to dapto/imipenem on re-adm.   Past Medical History  Diagnosis Date  . Diabetes mellitus without complication   . Hypertension   . Chronic hip pain   . GERD (gastroesophageal reflux disease)   . Depression     Past Surgical History  Procedure Laterality Date  . Rotator cuff repair    . Hernia repair       Allergies  Allergen Reactions  . Other     Pt is a Jehovah Witness. No blood products.  . Sulfa Antibiotics     Causes flu-like symptom : sweating, chills, fever, body aches    Medications:  Scheduled: . aspirin EC  81 mg Oral Daily  . atorvastatin  80 mg Oral Daily  . DAPTOmycin (CUBICIN)  IV  640 mg Intravenous Q24H  . enoxaparin  0.5 mg/kg Subcutaneous Q24H  . famotidine  20 mg Oral BID  . gabapentin  300 mg Oral TID  . hydrALAZINE  25 mg Oral 3 times per day  . imipenem-cilastatin  500 mg Intravenous Q6H  . insulin aspart  0-9 Units Subcutaneous TID WC  . insulin aspart protamine- aspart  21 Units Subcutaneous BID WC  . lisinopril  10 mg Oral Daily  .  meloxicam  7.5 mg Oral BID  . mirtazapine  15 mg Oral QHS  . oxybutynin  10 mg Oral QHS  . protein supplement  2 oz Oral TID  . tamsulosin  0.4 mg Oral QPC supper    Abtx:  Anti-infectives    Start     Dose/Rate Route Frequency Ordered Stop   03/26/15 1200  imipenem-cilastatin (PRIMAXIN) 500 mg in sodium chloride 0.9 % 100 mL IVPB     500 mg 200 mL/hr over 30 Minutes Intravenous Every 6 hours 03/26/15 0417     03/26/15 0500  DAPTOmycin (CUBICIN) 640 mg in sodium chloride 0.9 % IVPB     640 mg 225.6 mL/hr over 30 Minutes Intravenous Every 24 hours 03/26/15 0421     03/26/15 0430  imipenem-cilastatin (PRIMAXIN) 500 mg in sodium chloride 0.9 % 100 mL IVPB     500 mg 200 mL/hr over 30 Minutes Intravenous  Once 03/26/15 0416 03/26/15 0536      Total days of antibiotics: 2 dapto/imipenem          Social History:  reports that he has been smoking.  He does not have any smokeless tobacco history on file. He reports that he drinks alcohol. He reports that he does not use illicit drugs.  Family History  Problem Relation Age of Onset  . Diabetes Mellitus II Mother   .  Diabetes Mellitus II Father   . Diabetes Mellitus II Brother     General ROS: fever. Anorexia.  see HPI  Blood pressure 161/82, pulse 65, temperature 97.5 F (36.4 C), temperature source Oral, resp. rate 18, height 6' 4"  (1.93 m), weight 107.3 kg (236 lb 8.9 oz), SpO2 100 %. General appearance: alert, cooperative and no distress Eyes: negative findings: pupils equal, round, reactive to light and accomodation Throat: normal findings: oropharynx pink & moist without lesions or evidence of thrush Neck: no adenopathy and supple, symmetrical, trachea midline Lungs: clear to auscultation bilaterally Heart: regular rate and rhythm Abdomen: normal findings: bowel sounds normal and soft, non-tender Extremities: BLE wrapped. there is mild erythema and swelling at L knee. non-tender.    Results for orders placed or performed  during the hospital encounter of 03/26/15 (from the past 48 hour(s))  CBC with Differential/Platelet     Status: Abnormal   Collection Time: 03/26/15  1:41 AM  Result Value Ref Range   WBC 12.3 (H) 4.0 - 10.5 K/uL   RBC 3.96 (L) 4.22 - 5.81 MIL/uL   Hemoglobin 7.8 (L) 13.0 - 17.0 g/dL   HCT 25.1 (L) 39.0 - 52.0 %   MCV 63.4 (L) 78.0 - 100.0 fL   MCH 19.7 (L) 26.0 - 34.0 pg   MCHC 31.1 30.0 - 36.0 g/dL   RDW 16.4 (H) 11.5 - 15.5 %   Platelets 431 (H) 150 - 400 K/uL   Neutrophils Relative % 75 43 - 77 %   Lymphocytes Relative 13 12 - 46 %   Monocytes Relative 11 3 - 12 %   Eosinophils Relative 1 0 - 5 %   Basophils Relative 0 0 - 1 %   Neutro Abs 9.2 (H) 1.7 - 7.7 K/uL   Lymphs Abs 1.6 0.7 - 4.0 K/uL   Monocytes Absolute 1.4 (H) 0.1 - 1.0 K/uL   Eosinophils Absolute 0.1 0.0 - 0.7 K/uL   Basophils Absolute 0.0 0.0 - 0.1 K/uL   RBC Morphology TARGET CELLS   Basic metabolic panel     Status: Abnormal   Collection Time: 03/26/15  1:41 AM  Result Value Ref Range   Sodium 136 135 - 145 mmol/L   Potassium 4.0 3.5 - 5.1 mmol/L   Chloride 100 (L) 101 - 111 mmol/L   CO2 26 22 - 32 mmol/L   Glucose, Bld 152 (H) 65 - 99 mg/dL   BUN 10 6 - 20 mg/dL   Creatinine, Ser 0.64 0.61 - 1.24 mg/dL   Calcium 8.3 (L) 8.9 - 10.3 mg/dL   GFR calc non Af Amer >60 >60 mL/min   GFR calc Af Amer >60 >60 mL/min    Comment: (NOTE) The eGFR has been calculated using the CKD EPI equation. This calculation has not been validated in all clinical situations. eGFR's persistently <60 mL/min signify possible Chronic Kidney Disease.    Anion gap 10 5 - 15  Urinalysis, Routine w reflex microscopic (not at [facility])     Status: None   Collection Time: 03/26/15  1:44 AM  Result Value Ref Range   Color, Urine YELLOW YELLOW   APPearance CLEAR CLEAR   Specific Gravity, Urine 1.006 1.005 - 1.030   pH 7.5 5.0 - 8.0   Glucose, UA NEGATIVE NEGATIVE mg/dL   Hgb urine dipstick NEGATIVE NEGATIVE   Bilirubin Urine NEGATIVE  NEGATIVE   Ketones, ur NEGATIVE NEGATIVE mg/dL   Protein, ur NEGATIVE NEGATIVE mg/dL   Urobilinogen, UA 0.2 0.0 -  1.0 mg/dL   Nitrite NEGATIVE NEGATIVE   Leukocytes, UA NEGATIVE NEGATIVE    Comment: MICROSCOPIC NOT DONE ON URINES WITH NEGATIVE PROTEIN, BLOOD, LEUKOCYTES, NITRITE, OR GLUCOSE <1000 mg/dL.  I-Stat CG4 Lactic Acid, ED     Status: None   Collection Time: 03/26/15  1:51 AM  Result Value Ref Range   Lactic Acid, Venous 0.89 0.5 - 2.0 mmol/L  Culture, blood (routine x 2)     Status: None (Preliminary result)   Collection Time: 03/26/15  7:12 AM  Result Value Ref Range   Specimen Description BLOOD LEFT ARM    Special Requests      BOTTLES DRAWN AEROBIC AND ANAEROBIC 10CC Performed at [facility]    Culture PENDING    Report Status PENDING   Culture, blood (routine x 2)     Status: None (Preliminary result)   Collection Time: 03/26/15  7:17 AM  Result Value Ref Range   Specimen Description BLOOD RIGHT ARM    Special Requests      BOTTLES DRAWN AEROBIC AND ANAEROBIC 10CC Performed at [facility]    Culture PENDING    Report Status PENDING   Glucose, capillary     Status: Abnormal   Collection Time: 03/26/15  7:51 AM  Result Value Ref Range   Glucose-Capillary 172 (H) 65 - 99 mg/dL   Comment 1 Notify RN   Glucose, capillary     Status: Abnormal   Collection Time: 03/26/15 11:52 AM  Result Value Ref Range   Glucose-Capillary 163 (H) 65 - 99 mg/dL   Comment 1 Notify RN       Component Value Date/Time   SDES BLOOD RIGHT ARM 03/26/2015 0717   SPECREQUEST  03/26/2015 0717    BOTTLES DRAWN AEROBIC AND ANAEROBIC 10CC Performed at [facility] PENDING 03/26/2015 3159   REPTSTATUS PENDING 03/26/2015 4585   Dg Chest Port 1 View  03/26/2015   CLINICAL DATA:  Acute onset of generalized chest pain and shortness of breath. Initial encounter.  EXAM: PORTABLE CHEST - 1 VIEW  COMPARISON:  Chest radiograph from 03/14/2015, and CTA of the chest  performed 03/17/2015  FINDINGS: The lungs are hypoexpanded. Vascular crowding and vascular congestion are seen. Mild right basilar airspace opacity could reflect mild pneumonia or possibly minimal interstitial edema. No pleural effusion or pneumothorax is seen.  The cardiomediastinal silhouette is borderline enlarged. No acute osseous abnormalities are identified.  IMPRESSION: Lungs hypoexpanded. Vascular congestion and borderline cardiomegaly. Mild right basilar airspace opacity could reflect mild pneumonia or possibly minimal interstitial edema.   Electronically Signed   By: Garald Balding M.D.   On: 03/26/2015 02:46   Recent Results (from the past 240 hour(s))  Culture, blood (routine x 2)     Status: None   Collection Time: 03/16/15  7:24 PM  Result Value Ref Range Status   Specimen Description BLOOD BLOOD LEFT ARM  Final   Special Requests BOTTLES DRAWN AEROBIC AND ANAEROBIC 5ML  Final   Culture   Final    NO GROWTH 5 DAYS Performed at [facility]    Report Status 03/21/2015 FINAL  Final  Culture, blood (routine x 2)     Status: None   Collection Time: 03/16/15  8:12 PM  Result Value Ref Range Status   Specimen Description BLOOD BLOOD LEFT WRIST  Final   Special Requests BOTTLES DRAWN AEROBIC AND ANAEROBIC 5ML  Final   Culture   Final  NO GROWTH 5 DAYS Performed at [facility]    Report Status 03/21/2015 FINAL  Final  Culture, Urine     Status: None   Collection Time: 03/17/15 11:20 AM  Result Value Ref Range Status   Specimen Description URINE, CLEAN CATCH  Final   Special Requests NONE  Final   Culture   Final    NO GROWTH 1 DAY Performed at [facility]    Report Status 03/18/2015 FINAL  Final  Culture, blood (routine x 2)     Status: None (Preliminary result)   Collection Time: 03/26/15  7:12 AM  Result Value Ref Range Status   Specimen Description BLOOD LEFT ARM  Final   Special Requests   Final    BOTTLES DRAWN AEROBIC AND ANAEROBIC  10CC Performed at [facility]    Culture PENDING  Incomplete   Report Status PENDING  Incomplete  Culture, blood (routine x 2)     Status: None (Preliminary result)   Collection Time: 03/26/15  7:17 AM  Result Value Ref Range Status   Specimen Description BLOOD RIGHT ARM  Final   Special Requests   Final    BOTTLES DRAWN AEROBIC AND ANAEROBIC 10CC Performed at [facility]    Culture PENDING  Incomplete   Report Status PENDING  Incomplete      03/26/2015, 4:26 PM     LOS: 0 days

## 2015-03-26 NOTE — Consult Note (Addendum)
WOC wound consult note Patient seen just little over a week ago for same.  Patient with longstanding history of venous stasis ulcers and has been followed by many different providers. He reports some complications with arranging his care at home and such through the [place].  He was living at the [facility] and I think this posed a barrier to some of the care.  He was DC to SNF  Reason for Consult: LE ulcers  Wound type: Venous stasis ulcer RLE: 7cm x 4cm x 0.5cm  Previous left medial and lateral malleolar wounds are now dry and stable, not open or draining.  These ulcers were problematic previously but now improved. New ulceration on the LLE posterior calf: 10cm x 5cm x 0.1cm and intact bulla laterally  Measurement:see above Wound bed:  Right medial deeper with moderate exudate, some fibrin on the wound 90% fibrin/10% pink buds. New area on the posterior left calf is tender with some pink, partial thickness skin loss Drainage (amount, consistency, odor) yellow, thick drainage from the RLE ulcer, but consistent with normal venous stasis drainage, no odor Serosanguinous drainage from the  LLE ulcer, no purulence  Periwound: mild maceration of the edges of the ulcer on the RLE, may need to have compression and topical therapy changed more frequently  Dressing procedure/placement/frequency: Calcium alginate to the open areas bilateral LEs, cover with foam.  Will have bedside nursing to page orthopedic tech to place Unna's boots once topical care placed.  Will have tech change compression wraps again on Tuesday.  Dr. Rockne Menghini to the bedside with me for her assessment and to develop a plan of care for this patient.   Pt needs to be set up with follow up in a wound care center to manage these chronic venous stasis ulceration and his need for compression.  Patient has not been ambulatory in over a week, unclear for the reason.  Dr. Rockne Menghini to add PT evaluation.  Discussed POC with patient and bedside nurse.   Re consult if needed, will not follow at this time. Thanks  Macai Sisneros [place], [place] [phone])

## 2015-03-26 NOTE — Progress Notes (Signed)
ANTIBIOTIC CONSULT NOTE - INITIAL  Pharmacy Consult for Primaxin & Cubicin Indication: Cellulitis/Sepsis  Allergies  Allergen Reactions  . Other     Pt is a Jehovah Witness. No blood products.  . Sulfa Antibiotics     Causes flu-like symptom : sweating, chills, fever, body aches    Patient Measurements:    Vital Signs: Temp: 99.8 F (37.7 C) (07/03 0029) Temp Source: Oral (07/03 0029) BP: 169/88 mmHg (07/03 0330) Pulse Rate: 88 (07/03 0330) Intake/Output from previous day: 07/02 0701 - 07/03 0700 In: -  Out: 300 [Urine:300] Intake/Output from this shift: Total I/O In: -  Out: 300 [Urine:300]  Labs:  Recent Labs  03/24/15 0502 03/26/15 0141  WBC 12.2* 12.3*  HGB 8.0* 7.8*  PLT 413* 431*  CREATININE  --  0.64   Estimated Creatinine Clearance: 124.4 mL/min (by C-G formula based on Cr of 0.64). No results for input(s): VANCOTROUGH, VANCOPEAK, VANCORANDOM, GENTTROUGH, GENTPEAK, GENTRANDOM, TOBRATROUGH, TOBRAPEAK, TOBRARND, AMIKACINPEAK, AMIKACINTROU, AMIKACIN in the last 72 hours.   Microbiology: Recent Results (from the past 720 hour(s))  Urine culture     Status: None   Collection Time: 03/14/15  7:39 PM  Result Value Ref Range Status   Specimen Description URINE, CLEAN CATCH  Final   Special Requests NONE  Final   Culture   Final    MULTIPLE SPECIES PRESENT, SUGGEST RECOLLECTION IF CLINICALLY INDICATED Performed at [facility]    Report Status 03/16/2015 FINAL  Final  Blood culture (routine x 2)     Status: None   Collection Time: 03/14/15  7:55 PM  Result Value Ref Range Status   Specimen Description BLOOD RIGHT FOREARM  Final   Special Requests BOTTLES DRAWN AEROBIC AND ANAEROBIC 5CC  Final   Culture   Final    NO GROWTH 5 DAYS Performed at [facility]    Report Status 03/19/2015 FINAL  Final  Blood culture (routine x 2)     Status: None   Collection Time: 03/14/15  7:55 PM  Result Value Ref Range Status   Specimen Description  BLOOD RIGHT ANTECUBITAL  Final   Special Requests BOTTLES DRAWN AEROBIC AND ANAEROBIC 5ML  Final   Culture   Final    NO GROWTH 5 DAYS Performed at [facility]    Report Status 03/19/2015 FINAL  Final  MRSA PCR Screening     Status: None   Collection Time: 03/15/15  9:28 PM  Result Value Ref Range Status   MRSA by PCR NEGATIVE NEGATIVE Final    Comment:        The GeneXpert MRSA Assay (FDA approved for NASAL specimens only), is one component of a comprehensive MRSA colonization surveillance program. It is not intended to diagnose MRSA infection nor to guide or monitor treatment for MRSA infections.   Culture, blood (routine x 2)     Status: None   Collection Time: 03/16/15  7:24 PM  Result Value Ref Range Status   Specimen Description BLOOD BLOOD LEFT ARM  Final   Special Requests BOTTLES DRAWN AEROBIC AND ANAEROBIC 5ML  Final   Culture   Final    NO GROWTH 5 DAYS Performed at [facility]    Report Status 03/21/2015 FINAL  Final  Culture, blood (routine x 2)     Status: None   Collection Time: 03/16/15  8:12 PM  Result Value Ref Range Status   Specimen Description BLOOD BLOOD LEFT WRIST  Final   Special Requests BOTTLES  DRAWN AEROBIC AND ANAEROBIC 5ML  Final   Culture   Final    NO GROWTH 5 DAYS Performed at [facility]    Report Status 03/21/2015 FINAL  Final  Culture, Urine     Status: None   Collection Time: 03/17/15 11:20 AM  Result Value Ref Range Status   Specimen Description URINE, CLEAN CATCH  Final   Special Requests NONE  Final   Culture   Final    NO GROWTH 1 DAY Performed at [facility]    Report Status 03/18/2015 FINAL  Final    Medical History: Past Medical History  Diagnosis Date  . Diabetes mellitus without complication   . Hypertension   . Chronic hip pain   . GERD (gastroesophageal reflux disease)   . Depression     Medications:  Scheduled:  . DAPTOmycin (CUBICIN)  IV  640 mg Intravenous Q24H    Infusions:  . imipenem-cilastatin    . imipenem-cilastatin     Assessment:  59 yr male with recent hospitaliation for sepsis with left lower extremity cellulitis.  Patient has been on Zosyn, Vancomycin, Augmentin and Zyvox.  Patient discharge on 7/1 and since has noted fever, pain/drainage from left calf.  Admitting MD has spoken with ID.  Pharmacy consulted to dose Daptomycin and Primaxin for sepsis and cellulitis.   Goal of Therapy:  Eradication of infection  Plan:  Primaxin 500mg  IV q6h Daptomycin 640mg  (6 mg/kg) IV q24h  Elin Seats, Toribio Harbour, PharmD 03/26/2015,4:21 AM

## 2015-03-26 NOTE — H&P (Signed)
[facility] History and Physical  [name] VOZ:[id number] DOB: [birth year] DOA: 03/26/2015  Referring physician: EDP PCP: [name] Merles, MD   Chief Complaint: Leg pain   HPI: [name] is a 59 y.o. male with h/o DM2, chronic RLE ulcer of leg.  Recently admitted for sepsis due to cellulitis and now weeping ulcer of left leg.  For a full history of that admit see Dr. Lyman [name] discharge summary on 7/1, but briefly: patient was admitted, on 6/21 despite zosyn and vanc however he was still febrile some days later.  ID was consulted and patient was switched to unasyn and zyvox.  MRI of LLE demonstrated no abscess, just cellulitis.  He continued to have low grade temperatures, but apparently infection improved somewhat and he was discharged on ampicillin and zyvox to short term SNF on ABx until 7/4 (tomorrow).  Despite ABx therapy however his symptoms have persisted, and his wounds have actually worsened.  Increased pain in LLE over past 24 hours and fluctuating temperatures subjectively (objectively has T of 99.8 in ED).  Decreased appetite, increased drainage from left leg.  No CP, no SOB.  Review of Systems: Systems reviewed.  As above, otherwise negative  Past Medical History  Diagnosis Date  . Diabetes mellitus without complication   . Hypertension   . Chronic hip pain   . GERD (gastroesophageal reflux disease)   . Depression    Past Surgical History  Procedure Laterality Date  . Rotator cuff repair    . Hernia repair     Social History:  reports that he has been smoking.  He does not have any smokeless tobacco history on file. He reports that he drinks alcohol. He reports that he does not use illicit drugs.  Allergies  Allergen Reactions  . Other     Pt is a Jehov[employer] Witness. No blood products.  . Sulfa Antibiotics     Causes flu-like symptom : sweating, chills, fever, body aches    Family History  Problem Relation Age of Onset  . Diabetes Mellitus II Mother    . Diabetes Mellitus II Father   . Diabetes Mellitus II Brother      Prior to Admission medications   Medication Sig Start Date End Date Taking? Authorizing Provider  acetaminophen (TYLENOL) 325 MG tablet Take 2 tablets (650 mg total) by mouth every 6 (six) hours as needed for mild pain (or Fever >/= 101). 03/23/15  Yes [name] Lis, MD  aspirin EC 81 MG tablet Take 81 mg by mouth every morning.   Yes Historical Provider, MD  atorvastatin (LIPITOR) 80 MG tablet Take 80 mg by mouth daily.   Yes Historical Provider, MD  capsicum oleoresin (TRIXAICIN) 0.025 % cream Apply 1 application topically 2 (two) times daily.   Yes Historical Provider, MD  famotidine (PEPCID) 20 MG tablet Take 1 tablet (20 mg total) by mouth 2 (two) times daily. 03/23/15  Yes [name] Lis, MD  ferrous sulfate 325 (65 FE) MG tablet Take 325 mg by mouth 3 (three) times daily with meals.   Yes Historical Provider, MD  gabapentin (NEURONTIN) 300 MG capsule Take 300 mg by mouth 3 (three) times daily.   Yes Historical Provider, MD  hydrALAZINE (APRESOLINE) 25 MG tablet Take 1 tablet (25 mg total) by mouth every 8 (eight) hours. 03/23/15  Yes [name] Lis, MD  insulin aspart protamine- aspart (NOVOLOG MIX 70/30) (70-30) 100 UNIT/ML injection Inject 0.21 mLs (21 Units total) into the skin 2 (two) times  daily with a meal. 03/24/15  Yes [name] Brod, MD  meloxicam (MOBIC) 7.5 MG tablet Take 7.5 mg by mouth 2 (two) times daily.   Yes Historical Provider, MD  mirtazapine (REMERON) 15 MG tablet Take 15 mg by mouth at bedtime.   Yes Historical Provider, MD  Multiple Vitamin (MULTIVITAMIN WITH MINERALS) TABS tablet Take 1 tablet by mouth daily. 03/23/15  Yes [name] Lis, MD  ondansetron (ZOFRAN) 4 MG tablet Take 1 tablet (4 mg total) by mouth every 6 (six) hours as needed for nausea. 03/23/15  Yes [name] Lis, MD  oxybutynin (DITROPAN-XL) 10 MG 24 hr tablet Take 10 mg by mouth at bedtime.   Yes Historical Provider, MD  tamsulosin  (FLOMAX) 0.4 MG CAPS capsule Take 0.4 mg by mouth daily after supper.   Yes Historical Provider, MD  Wound Dressings (PROMOGRAN EX) Apply 1 application topically every 3 (three) days. Apply dressing every three days   Yes Historical Provider, MD  docusate sodium (COLACE) 100 MG capsule Take 1 capsule (100 mg total) by mouth 2 (two) times daily as needed for mild constipation. 03/23/15   [name] Lis, MD  enoxaparin (LOVENOX) 150 MG/ML injection Inject 0.36 mLs (55 mg total) into the skin daily. 03/23/15   [name] Lis, MD  HYDROcodone-acetaminophen (NORCO/VICODIN) 5-325 MG per tablet Take 1 tablet by mouth every 8 (eight) hours as needed for moderate pain. 03/24/15   [name] Brod, MD  ibuprofen (ADVIL,MOTRIN) 800 MG tablet Take 800 mg by mouth every 8 (eight) hours as needed for fever or moderate pain.    Historical Provider, MD  insulin aspart (NOVOLOG) 100 UNIT/ML injection Inject 0-9 Units into the skin 3 (three) times daily with meals. 03/23/15   [name] Lis, MD  protein supplement (UNJURY CHOCOLATE CLASSIC) POWD Take 7 g (2 oz total) by mouth 3 (three) times daily. 03/23/15   [name] Lis, MD   Physical Exam: Filed Vitals:   03/26/15 0430  BP: 185/83  Pulse:   Temp:   Resp:     BP 185/83 mmHg  Pulse 88  Temp(Src) 99.8 F (37.7 C) (Oral)  Resp 24  SpO2 97%  General Appearance:    Alert, oriented, no distress, appears stated age  Head:    Normocephalic, atraumatic  Eyes:    PERRL, EOMI, sclera non-icteric        Nose:   Nares without drainage or epistaxis. Mucosa, turbinates normal  Throat:   Moist mucous membranes. Oropharynx without erythema or exudate.  Neck:   Supple. No carotid bruits.  No thyromegaly.  No lymphadenopathy.   Back:     No CVA tenderness, no spinal tenderness  Lungs:     Clear to auscultation bilaterally, without wheezes, rhonchi or rales  Chest wall:    No tenderness to palpitation  Heart:    Regular rate and rhythm without murmurs, gallops, rubs   Abdomen:     Soft, non-tender, nondistended, normal bowel sounds, no organomegaly  Genitalia:    deferred  Rectal:    deferred  Extremities:   No clubbing, cyanosis or edema.  Pulses:   2+ and symmetric all extremities  Skin:   Skin color, texture, turgor normal, no rashes or lesions  Lymph nodes:   Cervical, supraclavicular, and axillary nodes normal  Neurologic:   CNII-XII intact. Normal strength, sensation and reflexes      throughout    Labs on Admission:  Basic Metabolic Panel:  Recent Labs Lab 03/22/15 0818  03/26/15 0141  NA 142 136  K 3.9 4.0  CL 106 100*  CO2 27 26  GLUCOSE 221* 152*  BUN 19 10  CREATININE 0.90 0.64  CALCIUM 8.2* 8.3*   Liver Function Tests: No results for input(s): AST, ALT, ALKPHOS, BILITOT, PROT, ALBUMIN in the last 168 hours. No results for input(s): LIPASE, AMYLASE in the last 168 hours. No results for input(s): AMMONIA in the last 168 hours. CBC:  Recent Labs Lab 03/22/15 0818 03/24/15 0502 03/26/15 0141  WBC 10.2 12.2* 12.3*  NEUTROABS  --  9.2* 9.2*  HGB 7.5* 8.0* 7.8*  HCT 24.1* 25.5* 25.1*  MCV 64.1* 64.2* 63.4*  PLT 342 413* 431*   Cardiac Enzymes: No results for input(s): CKTOTAL, CKMB, CKMBINDEX, TROPONINI in the last 168 hours.  BNP (last 3 results) No results for input(s): PROBNP in the last 8760 hours. CBG:  Recent Labs Lab 03/23/15 1530 03/23/15 2122 03/24/15 0741 03/24/15 1211 03/24/15 1728  GLUCAP 78 93 101* 134* 151*    Radiological Exams on Admission: Dg Chest Port 1 View  03/26/2015   CLINICAL DATA:  Acute onset of generalized chest pain and shortness of breath. Initial encounter.  EXAM: PORTABLE CHEST - 1 VIEW  COMPARISON:  Chest radiograph from 03/14/2015, and CTA of the chest performed 03/17/2015  FINDINGS: The lungs are hypoexpanded. Vascular crowding and vascular congestion are seen. Mild right basilar airspace opacity could reflect mild pneumonia or possibly minimal interstitial edema. No pleural  effusion or pneumothorax is seen.  The cardiomediastinal silhouette is borderline enlarged. No acute osseous abnormalities are identified.  IMPRESSION: Lungs hypoexpanded. Vascular congestion and borderline cardiomegaly. Mild right basilar airspace opacity could reflect mild pneumonia or possibly minimal interstitial edema.   Electronically Signed   By: Garald Balding M.D.   On: 03/26/2015 02:46    EKG: Independently reviewed.  Assessment/Plan Active Problems:   Cellulitis of left lower extremity   DM (diabetes mellitus), secondary, uncontrolled, with peripheral vascular complications   Leukocytosis   1. Cellulitis of LLE - persistent low grade fevers (99.8), worsening of pain and drainage, despite outpatient therapy with ampicillin and zyvox.  Also appeared to fail zosyn and vanc inpatient during last admission. 1. Have spoken with Dr. Johnnye Sima of ID (who saw patient last admit, last on 7/1) 1. He will consult on patient 2. In the mean time, empiric therapy with imipenem and either dapto or zyvox (went with dapto). 2. Wound cultures ordered 3. Blood cultures ordered 4. Wound care consult 5. Tylenol PRN fever 6. Dilaudid PRN severe pain 2. DM - 1. Continue home 70/30 at 21 units BID 2. Sensitive scale SSI AC/HS 3. DVT ppx: continuing lovenox that he has been on at the NH Q24H    Code Status: Full Code  Family Communication: No family in room Disposition Plan: Admit to inpatient   Time spent: 70 min  Marcene Laskowski M. [facility] Pager [phone]  If 7AM-7PM, please contact the day team taking care of the patient [email or web address] Password [facility] 03/26/2015, 4:57 AM

## 2015-03-26 NOTE — ED Provider Notes (Signed)
CSN: [id number]     Arrival date & time 03/26/15  0026 History   First MD Initiated Contact with Patient 03/26/15 0114     Chief Complaint  Patient presents with  . leg infection      (Consider location/radiation/quality/duration/timing/severity/associated sxs/prior Treatment) HPI Comments: Patient with recent admission for sepsis and left lower extremity wounds presents with worsening symptoms. Patient was discharged from the hospital on 03/24/15 after being admitted for sepsis. He was discharged home on Augmentin and Zyvox which he is to take until 03/27/15. Patient states that his wounds were improving during hospitalization however in the past 24 hours patient has noted fluctuating temperatures, increased pain in his left lower leg, decreased appetite, drainage and weeping from his left calf. No chest pain or shortness of breath. No URI symptoms. No abdominal pain, vomiting or diarrhea. He is currently at [facility].   The history is provided by the patient and medical records.    Past Medical History  Diagnosis Date  . Diabetes mellitus without complication   . Hypertension   . Chronic hip pain   . GERD (gastroesophageal reflux disease)   . Depression    Past Surgical History  Procedure Laterality Date  . Rotator cuff repair    . Hernia repair     Family History  Problem Relation Age of Onset  . Diabetes Mellitus II Mother   . Diabetes Mellitus II Father   . Diabetes Mellitus II Brother    History  Substance Use Topics  . Smoking status: Current Every Day Smoker  . Smokeless tobacco: Not on file  . Alcohol Use: Yes     Comment: occasionally.    Review of Systems  Constitutional: Positive for fever and chills.  HENT: Negative for rhinorrhea and sore throat.   Eyes: Negative for redness.  Respiratory: Negative for cough and shortness of breath.   Cardiovascular: Positive for leg swelling. Negative for chest pain.  Gastrointestinal: Negative for nausea, vomiting,  abdominal pain and diarrhea.  Genitourinary: Negative for dysuria.  Musculoskeletal: Positive for myalgias.  Skin: Positive for color change, rash and wound.  Neurological: Negative for headaches.    Allergies  Other and Sulfa antibiotics  Home Medications   Prior to Admission medications   Medication Sig Start Date End Date Taking? Authorizing Provider  Wound Dressings (PROMOGRAN EX) Apply 1 application topically every 3 (three) days. Apply dressing every three days   Yes Historical Provider, MD  acetaminophen (TYLENOL) 325 MG tablet Take 2 tablets (650 mg total) by mouth every 6 (six) hours as needed for mild pain (or Fever >/= 101). 03/23/15   Robbie Lis, MD  amoxicillin-clavulanate (AUGMENTIN) 875-125 MG per tablet Take 1 tablet by mouth 2 (two) times daily. 03/24/15 03/27/15  Annita Brod, MD  aspirin EC 81 MG tablet Take 81 mg by mouth every morning.    Historical Provider, MD  atorvastatin (LIPITOR) 80 MG tablet Take 80 mg by mouth daily.    Historical Provider, MD  capsicum oleoresin (TRIXAICIN) 0.025 % cream Apply 1 application topically 2 (two) times daily.    Historical Provider, MD  docusate sodium (COLACE) 100 MG capsule Take 1 capsule (100 mg total) by mouth 2 (two) times daily as needed for mild constipation. 03/23/15   Robbie Lis, MD  enoxaparin (LOVENOX) 150 MG/ML injection Inject 0.36 mLs (55 mg total) into the skin daily. 03/23/15   Robbie Lis, MD  famotidine (PEPCID) 20 MG tablet Take 1 tablet (20  mg total) by mouth 2 (two) times daily. 03/23/15   Robbie Lis, MD  ferrous sulfate 325 (65 FE) MG tablet Take 325 mg by mouth 3 (three) times daily with meals.    Historical Provider, MD  gabapentin (NEURONTIN) 300 MG capsule Take 300 mg by mouth 3 (three) times daily.    Historical Provider, MD  hydrALAZINE (APRESOLINE) 25 MG tablet Take 1 tablet (25 mg total) by mouth every 8 (eight) hours. 03/23/15   Robbie Lis, MD  HYDROcodone-acetaminophen (NORCO/VICODIN) 5-325  MG per tablet Take 1 tablet by mouth every 8 (eight) hours as needed for moderate pain. 03/24/15   Annita Brod, MD  ibuprofen (ADVIL,MOTRIN) 800 MG tablet Take 800 mg by mouth every 8 (eight) hours as needed for fever or moderate pain.    Historical Provider, MD  insulin aspart (NOVOLOG) 100 UNIT/ML injection Inject 0-9 Units into the skin 3 (three) times daily with meals. 03/23/15   Robbie Lis, MD  insulin aspart protamine- aspart (NOVOLOG MIX 70/30) (70-30) 100 UNIT/ML injection Inject 0.21 mLs (21 Units total) into the skin 2 (two) times daily with a meal. 03/24/15   Annita Brod, MD  linezolid (ZYVOX) 600 MG tablet 1 tab po BID x 4 days.  Stop after last dose 7/4 03/24/15   Annita Brod, MD  meloxicam (MOBIC) 7.5 MG tablet Take 7.5 mg by mouth 2 (two) times daily.    Historical Provider, MD  mirtazapine (REMERON) 15 MG tablet Take 15 mg by mouth at bedtime.    Historical Provider, MD  Multiple Vitamin (MULTIVITAMIN WITH MINERALS) TABS tablet Take 1 tablet by mouth daily. 03/23/15   Robbie Lis, MD  ondansetron (ZOFRAN) 4 MG tablet Take 1 tablet (4 mg total) by mouth every 6 (six) hours as needed for nausea. 03/23/15   Robbie Lis, MD  oxybutynin (DITROPAN-XL) 10 MG 24 hr tablet Take 10 mg by mouth at bedtime.    Historical Provider, MD  protein supplement (UNJURY CHOCOLATE CLASSIC) POWD Take 7 g (2 oz total) by mouth 3 (three) times daily. 03/23/15   Robbie Lis, MD  tamsulosin (FLOMAX) 0.4 MG CAPS capsule Take 0.4 mg by mouth daily after supper.    Historical Provider, MD   BP 168/83 mmHg  Pulse 99  Temp(Src) 99.8 F (37.7 C) (Oral)  Resp 24  SpO2 88%   Physical Exam  Constitutional: He appears well-developed and well-nourished.  HENT:  Head: Normocephalic and atraumatic.  Mouth/Throat: Oropharynx is clear and moist.  Eyes: Conjunctivae are normal. Right eye exhibits no discharge. Left eye exhibits no discharge.  Neck: Normal range of motion. Neck supple.   Cardiovascular: Normal rate, regular rhythm and normal heart sounds.   No murmur R[place]on. Pulmonary/Chest: Effort normal and breath sounds normal. No respiratory distress. He has no wheezes. He has no rales.  Abdominal: Soft. There is no tenderness. There is no rebound and no guarding.  Musculoskeletal: He exhibits edema and tenderness.  Chronic wounds to L lower extremity. Patient has ulcerations bilateral foot. He has weeping wound, warmth, and redness to the posterior calf. Generalized tenderness to palpation.    Neurological: He is alert.  Skin: Skin is warm and dry.  Psychiatric: He has a normal mood and affect.  Nursing note and vitals reviewed.   ED Course  Procedures (including critical care time) Labs Review Labs Reviewed  CBC WITH DIFFERENTIAL/PLATELET - Abnormal; Notable for the following:    WBC 12.3 (*)  RBC 3.96 (*)    Hemoglobin 7.8 (*)    HCT 25.1 (*)    MCV 63.4 (*)    MCH 19.7 (*)    RDW 16.4 (*)    Platelets 431 (*)    Neutro Abs 9.2 (*)    Monocytes Absolute 1.4 (*)    All other components within normal limits  BASIC METABOLIC PANEL - Abnormal; Notable for the following:    Chloride 100 (*)    Glucose, Bld 152 (*)    Calcium 8.3 (*)    All other components within normal limits  URINALYSIS, ROUTINE W REFLEX MICROSCOPIC (NOT AT [facility])  I-STAT CG4 LACTIC ACID, ED    Imaging Review Dg Chest Port 1 View  03/26/2015   CLINICAL DATA:  Acute onset of generalized chest pain and shortness of breath. Initial encounter.  EXAM: PORTABLE CHEST - 1 VIEW  COMPARISON:  Chest radiograph from 03/14/2015, and CTA of the chest performed 03/17/2015  FINDINGS: The lungs are hypoexpanded. Vascular crowding and vascular congestion are seen. Mild right basilar airspace opacity could reflect mild pneumonia or possibly minimal interstitial edema. No pleural effusion or pneumothorax is seen.  The cardiomediastinal silhouette is borderline enlarged. No acute osseous abnormalities are  identified.  IMPRESSION: Lungs hypoexpanded. Vascular congestion and borderline cardiomegaly. Mild right basilar airspace opacity could reflect mild pneumonia or possibly minimal interstitial edema.   Electronically Signed   By: Garald Balding M.D.   On: 03/26/2015 02:46     EKG Interpretation None       1:17 AM Patient seen and examined. Work-up initiated. Medications ordered. Reviewed notes from recent hospitalization.   Vital signs reviewed and are as follows: BP 168/83 mmHg  Pulse 99  Temp(Src) 99.8 F (37.7 C) (Oral)  Resp 24  SpO2 88%  3:43 AM Patient discussed with Dr. Florina Ou who has seen patient. Will admit patient given clinical worsening.   Discussed with Dr. Alcario Drought who will admit to hospital.   CXR shows ? PNA. No clinical signs of PNA.    MDM   Final diagnoses:  Hypoxia  Cellulitis of left lower extremity   Admit.    Carlisle Cater, PA-C 03/26/15 [place], MD 03/26/15 [phone]

## 2015-03-26 NOTE — Progress Notes (Signed)
Progress Note   [name] ZOX:[id number] DOB: 1956-06-19 DOA: 04-10-15 PCP: Charlsie Merles, MD   Brief Narrative:   [name] is an 59 y.o. male with a PMH of diabetes and a chronic right lower extremity diabetic ulcer, recently hospitalized 03/14/15-03/24/15 with cellulitis treated with broad-spectrum antibiotics, discharged on Augmentin and Zyvox, who was readmitted to the hospital 04-10-2015 with persistent cellulitis and worsening of his right leg wounds.  Assessment/Plan:   Principal Problem:   Cellulitis of left lower extremity / leukocytosis - ID consult pending.  Continue Cubicin and Primaxin. - Wounds documented below.  Wound care RN recommends Unna boot LLE, will have her apply. - Pain management with Percocet, and Dilaudid if ineffective. - F/U wound, blood cultures.  Active Problems:   DM (diabetes mellitus), secondary, uncontrolled, with peripheral vascular complications - Currently on 21 units of 70/30 BID and insulin sensitive SSI.  CBGs 93-172.    Hypertension - Has been on lisinopril/HCTZ in past. - Re-start lisinopril now.    DVT Prophylaxis - Lovenox.  Family Communication: No family at bedside, family out of town. Disposition Plan: Home when stable, likely several days. Code Status:     Code Status Orders        Start     Ordered   Apr 10, 2015 0452  Full code   Continuous     Apr 10, 2015 0452        IV Access:    Peripheral IV   Procedures and diagnostic studies:   Dg Chest Port 1 View  04/10/15   CLINICAL DATA:  Acute onset of generalized chest pain and shortness of breath. Initial encounter.  EXAM: PORTABLE CHEST - 1 VIEW  COMPARISON:  Chest radiograph from 03/14/2015, and CTA of the chest performed 03/17/2015  FINDINGS: The lungs are hypoexpanded. Vascular crowding and vascular congestion are seen. Mild right basilar airspace opacity could reflect mild pneumonia or possibly minimal interstitial edema. No pleural effusion or pneumothorax  is seen.  The cardiomediastinal silhouette is borderline enlarged. No acute osseous abnormalities are identified.  IMPRESSION: Lungs hypoexpanded. Vascular congestion and borderline cardiomegaly. Mild right basilar airspace opacity could reflect mild pneumonia or possibly minimal interstitial edema.   Electronically Signed   By: Garald Balding M.D.   On: April 10, 2015 02:46     Medical Consultants:    ID  Anti-Infectives:   Anti-infectives    Start     Dose/Rate Route Frequency Ordered Stop   04-10-2015 1200  imipenem-cilastatin (PRIMAXIN) 500 mg in sodium chloride 0.9 % 100 mL IVPB     500 mg 200 mL/hr over 30 Minutes Intravenous Every 6 hours 10-Apr-2015 0417     2015/04/10 0500  DAPTOmycin (CUBICIN) 640 mg in sodium chloride 0.9 % IVPB     640 mg 225.6 mL/hr over 30 Minutes Intravenous Every 24 hours 04/10/15 0421     04-10-2015 0430  imipenem-cilastatin (PRIMAXIN) 500 mg in sodium chloride 0.9 % 100 mL IVPB     500 mg 200 mL/hr over 30 Minutes Intravenous  Once Apr 10, 2015 0416 2015-04-10 0536      Subjective:   [name] has had nausea and lack of appetite.  Has severe pain at ulcerative site on left leg.  Bowels moved yesterday.    Objective:    Filed Vitals:   April 10, 2015 0430 10-Apr-2015 0500 04-10-2015 0530 10-Apr-2015 0616  BP: 185/83 171/86 163/83 156/86  Pulse:  92 93 91  Temp:    98.6 F (37 C)  TempSrc:  Oral  Resp:    18  Height:    6\' 4"  (1.93 m)  Weight:    107.7 kg (237 lb 7 oz)  SpO2:  95% 95% 99%    Intake/Output Summary (Last 24 hours) at 03/26/15 0854 Last data filed at 03/26/15 0114  Gross per 24 hour  Intake      0 ml  Output    300 ml  Net   -300 ml    Exam: Gen:  NAD Cardiovascular:  RRR, No M/R/G Respiratory:  Lungs CTAB Gastrointestinal:  Abdomen soft, NT/ND, + BS Extremities:  As pictured below, LLE erythematous/swollen with ulcers  Right leg:   Left leg   Left leg:   Bulla, left leg:      Data Reviewed:    Labs: Basic Metabolic  Panel:  Recent Labs Lab 03/22/15 0818 03/26/15 0141  NA 142 136  K 3.9 4.0  CL 106 100*  CO2 27 26  GLUCOSE 221* 152*  BUN 19 10  CREATININE 0.90 0.64  CALCIUM 8.2* 8.3*   GFR Estimated Creatinine Clearance: 130.6 mL/min (by C-G formula based on Cr of 0.64). Liver Function Tests: No results for input(s): AST, ALT, ALKPHOS, BILITOT, PROT, ALBUMIN in the last 168 hours. No results for input(s): LIPASE, AMYLASE in the last 168 hours. No results for input(s): AMMONIA in the last 168 hours. Coagulation profile No results for input(s): INR, PROTIME in the last 168 hours.  CBC:  Recent Labs Lab 03/22/15 0818 03/24/15 0502 03/26/15 0141  WBC 10.2 12.2* 12.3*  NEUTROABS  --  9.2* 9.2*  HGB 7.5* 8.0* 7.8*  HCT 24.1* 25.5* 25.1*  MCV 64.1* 64.2* 63.4*  PLT 342 413* 431*   CBG:  Recent Labs Lab 03/23/15 2122 03/24/15 0741 03/24/15 1211 03/24/15 1728 03/26/15 0751  GLUCAP 93 101* 134* 151* 172*   Sepsis Labs:  Recent Labs Lab 03/22/15 0818 03/24/15 0502 03/26/15 0141 03/26/15 0151  WBC 10.2 12.2* 12.3*  --   LATICACIDVEN  --   --   --  0.89   Microbiology Recent Results (from the past 240 hour(s))  Culture, blood (routine x 2)     Status: None   Collection Time: 03/16/15  7:24 PM  Result Value Ref Range Status   Specimen Description BLOOD BLOOD LEFT ARM  Final   Special Requests BOTTLES DRAWN AEROBIC AND ANAEROBIC 5ML  Final   Culture   Final    NO GROWTH 5 DAYS Performed at [facility]    Report Status 03/21/2015 FINAL  Final  Culture, blood (routine x 2)     Status: None   Collection Time: 03/16/15  8:12 PM  Result Value Ref Range Status   Specimen Description BLOOD BLOOD LEFT WRIST  Final   Special Requests BOTTLES DRAWN AEROBIC AND ANAEROBIC 5ML  Final   Culture   Final    NO GROWTH 5 DAYS Performed at [facility]    Report Status 03/21/2015 FINAL  Final  Culture, Urine     Status: None   Collection Time: 03/17/15 11:20 AM    Result Value Ref Range Status   Specimen Description URINE, CLEAN CATCH  Final   Special Requests NONE  Final   Culture   Final    NO GROWTH 1 DAY Performed at [facility]    Report Status 03/18/2015 FINAL  Final     Medications:   . aspirin EC  81 mg Oral Daily  . atorvastatin  80 mg  Oral Daily  . DAPTOmycin (CUBICIN)  IV  640 mg Intravenous Q24H  . enoxaparin  0.5 mg/kg Subcutaneous Q24H  . famotidine  20 mg Oral BID  . gabapentin  300 mg Oral TID  . hydrALAZINE  25 mg Oral 3 times per day  . imipenem-cilastatin  500 mg Intravenous Q6H  . insulin aspart  0-9 Units Subcutaneous TID WC  . insulin aspart protamine- aspart  21 Units Subcutaneous BID WC  . meloxicam  7.5 mg Oral BID  . mirtazapine  15 mg Oral QHS  . oxybutynin  10 mg Oral QHS  . protein supplement  2 oz Oral TID  . tamsulosin  0.4 mg Oral QPC supper   Continuous Infusions:   Time spent: 25 minutes.   LOS: 0 days   [name]  [facility] Pager [phone]. If unable to reach me by pager, please call my cell phone at [phone].  *Please refer to [email or web address], password [email or web address] to get updated schedule on who will round on this patient, as hospitalists switch teams weekly. If 7PM-7AM, please contact night-coverage at [email or web address], password [email or web address] for any overnight needs.  03/26/2015, 8:54 AM

## 2015-03-27 DIAGNOSIS — D509 Iron deficiency anemia, unspecified: Secondary | ICD-10-CM | POA: Diagnosis present

## 2015-03-27 LAB — CBC
HCT: 24.9 % — ABNORMAL LOW (ref 39.0–52.0)
Hemoglobin: 7.7 g/dL — ABNORMAL LOW (ref 13.0–17.0)
MCH: 20 pg — ABNORMAL LOW (ref 26.0–34.0)
MCHC: 30.9 g/dL (ref 30.0–36.0)
MCV: 64.7 fL — ABNORMAL LOW (ref 78.0–100.0)
Platelets: 442 10*3/uL — ABNORMAL HIGH (ref 150–400)
RBC: 3.85 MIL/uL — ABNORMAL LOW (ref 4.22–5.81)
RDW: 16.9 % — AB (ref 11.5–15.5)
WBC: 9 10*3/uL (ref 4.0–10.5)

## 2015-03-27 LAB — GLUCOSE, CAPILLARY
GLUCOSE-CAPILLARY: 232 mg/dL — AB (ref 65–99)
GLUCOSE-CAPILLARY: 132 mg/dL — AB (ref 65–99)
Glucose-Capillary: 228 mg/dL — ABNORMAL HIGH (ref 65–99)
Glucose-Capillary: 152 mg/dL — ABNORMAL HIGH (ref 65–99)
Glucose-Capillary: 138 mg/dL — ABNORMAL HIGH (ref 65–99)

## 2015-03-27 LAB — CK: Total CK: 70 U/L (ref 49–397)

## 2015-03-27 LAB — BASIC METABOLIC PANEL
Anion gap: 8 (ref 5–15)
BUN: 15 mg/dL (ref 6–20)
CALCIUM: 8.3 mg/dL — AB (ref 8.9–10.3)
CHLORIDE: 100 mmol/L — AB (ref 101–111)
CO2: 27 mmol/L (ref 22–32)
Creatinine, Ser: 0.74 mg/dL (ref 0.61–1.24)
GFR calc Af Amer: >60 mL/min (ref >60)
Glucose, Bld: 258 mg/dL — ABNORMAL HIGH (ref 65–99)
Potassium: 4.3 mmol/L (ref 3.5–5.1)
Sodium: 135 mmol/L (ref 135–145)

## 2015-03-27 MED ORDER — INSULIN ASPART 100 UNIT/ML ~~LOC~~ SOLN
0.0000 [IU] | Freq: Three times a day (TID) | SUBCUTANEOUS | Status: DC
  Administered 2015-03-27: 3 [IU] via SUBCUTANEOUS
  Administered 2015-03-27: 2 [IU] via SUBCUTANEOUS
  Administered 2015-03-28: 3 [IU] via SUBCUTANEOUS
  Administered 2015-03-28: 2 [IU] via SUBCUTANEOUS
  Administered 2015-03-29 – 2015-03-30 (×5): 3 [IU] via SUBCUTANEOUS
  Administered 2015-03-30 – 2015-03-31 (×2): 5 [IU] via SUBCUTANEOUS
  Administered 2015-03-31: 3 [IU] via SUBCUTANEOUS
  Administered 2015-03-31: 5 [IU] via SUBCUTANEOUS
  Administered 2015-04-01 (×3): 3 [IU] via SUBCUTANEOUS
  Administered 2015-04-02: 8 [IU] via SUBCUTANEOUS
  Administered 2015-04-02 – 2015-04-03 (×3): 3 [IU] via SUBCUTANEOUS
  Administered 2015-04-03: 2 [IU] via SUBCUTANEOUS
  Administered 2015-04-03: 3 [IU] via SUBCUTANEOUS
  Administered 2015-04-04: 5 [IU] via SUBCUTANEOUS
  Administered 2015-04-04: 3 [IU] via SUBCUTANEOUS
  Administered 2015-04-04: 2 [IU] via SUBCUTANEOUS

## 2015-03-27 MED ORDER — INSULIN ASPART 100 UNIT/ML ~~LOC~~ SOLN: 0.0000 [IU] | Freq: Every day | SUBCUTANEOUS | Status: DC

## 2015-03-27 MED ORDER — POLYETHYLENE GLYCOL 3350 17 G PO PACK: 17.0000 g | PACK | Freq: Every day | ORAL | Status: DC | PRN

## 2015-03-27 NOTE — Clinical Social Work Note (Signed)
Clinical Social Work Assessment  Patient Details  Name: [name] MRN: [id number] Date of Birth: Apr 03, 1956  Date of referral:  03/27/15               Reason for consult:  Discharge Planning                Permission sought to share information with:  Case Manager Permission granted to share information::  Yes, Verbal Permission Granted  Name::        Agency::  [facility] is case manager  Relationship::     Contact Information:     Housing/Transportation Living arrangements for the past 2 months:  [place] of Information:  Patient Patient Interpreter Needed:  None Criminal Activity/Legal Involvement Pertinent to Current Situation/Hospitalization:  No - Comment as needed Significant Relationships:  None Lives with:  Facility Resident Do you feel safe going back to the place where you live?  No Need for family participation in patient care:  No (Coment)  Care giving concerns:  Patient is from [facility] but does not feel they were managing his needs well. Patient requesting alternative SNF.   Social Worker assessment / plan:  CSW received referral due to patient being admitted from a facility. CSW reviewed chart and met with patient at bedside. Patient recently DC from hospital on Friday but was readmitted over the weekend. Patient did not like care at [facility] and reports he would like to transfer to alternative SNF. Patient reports that he knows he cannot go back to [facility] because of wound care needed. Patient reports he would like to have another SNF search and will talk with friends at the [place] to determine if they would suggest a place.   CSW completed FL2 and completed search. CSW will follow up with bed offers.  Employment status:  Disabled (Comment on whether or not currently receiving Disability) Insurance information:  Medicare PT Recommendations:  Not assessed at this time Information / Referral to community resources:  [place]  Patient/Family's Response to care:  Patient reports he has received good care in the hospital and is agreeable to SNF placement again. Patient is upset that placement at previous SNF was a disappointment.   Patient/Family's Understanding of and Emotional Response to Diagnosis, Current Treatment, and Prognosis:  Patient reports that he wants his legs to heal and has been complaint with treatment. Although patient had a negative experience at SNF he realizes he will need placement again for wound care. Patient reports he is feeling weaker than usual and is hopeful to heal quickly so he can get his housing voucher through the [place] and be independent.   Emotional Assessment Appearance:  Appears stated age Attitude/Demeanor/Rapport:  Other Affect (typically observed):  Appropriate Orientation:  Oriented to Self, Oriented to Place, Oriented to  Time, Oriented to Situation Alcohol / Substance use:  Not Applicable Psych involvement (Current and /or in the community):  No (Comment)  Discharge Needs  Concerns to be addressed:  Discharge Planning Concerns Readmission within the last 30 days:  Yes Current discharge risk:  None Barriers to Discharge:  Continued Medical Work up   [employer], [place] 03/27/2015, 2:41 PM [phone]

## 2015-03-27 NOTE — Progress Notes (Addendum)
Progress Note   [name] IZT:[id number] DOB: 09-Jan-1956 DOA: 2015-03-31 PCP: Charlsie Merles, MD   Brief Narrative:   [name] is an 59 y.o. male with a PMH of diabetes and a chronic right lower extremity diabetic ulcer, recently hospitalized 03/14/15-03/24/15 with cellulitis treated with broad-spectrum antibiotics, discharged on Augmentin and Zyvox, who was readmitted to the hospital 2015/03/31 with persistent cellulitis and worsening of his right leg wounds.  Assessment/Plan:   Principal Problem:   Cellulitis of left lower extremity / leukocytosis - ID following.  Continue Cubicin and Primaxin. Monitor CK values weekly while on Cubicin - Wounds documented below.  Wound care RN applied Unna boot LLE. - Pain management with Percocet, and Dilaudid if ineffective. - F/U wound, blood cultures.  Active Problems:   Microcytic anemia - We'll send iron studies.    DM (diabetes mellitus), secondary, uncontrolled, with peripheral vascular complications - Currently on 21 units of 70/30 BID and insulin sensitive SSI.  CBGs 160-232. - Change SSI to moderate scale.    Hypertension - Has been on lisinopril/HCTZ in past. - Lisinopril resumed 2015/03/31 with improvement in blood pressure.    DVT Prophylaxis - Lovenox.  Family Communication: No family at bedside, family out of town. Disposition Plan: Home when stable, likely several days. Code Status:     Code Status Orders        Start     Ordered   March 31, 2015 0452  Full code   Continuous     03-31-2015 0452        IV Access:    Peripheral IV   Procedures and diagnostic studies:   Dg Chest Port 1 View  2015/03/31   CLINICAL DATA:  Acute onset of generalized chest pain and shortness of breath. Initial encounter.  EXAM: PORTABLE CHEST - 1 VIEW  COMPARISON:  Chest radiograph from 03/14/2015, and CTA of the chest performed 03/17/2015  FINDINGS: The lungs are hypoexpanded. Vascular crowding and vascular congestion are seen. Mild  right basilar airspace opacity could reflect mild pneumonia or possibly minimal interstitial edema. No pleural effusion or pneumothorax is seen.  The cardiomediastinal silhouette is borderline enlarged. No acute osseous abnormalities are identified.  IMPRESSION: Lungs hypoexpanded. Vascular congestion and borderline cardiomegaly. Mild right basilar airspace opacity could reflect mild pneumonia or possibly minimal interstitial edema.   Electronically Signed   By: Garald Balding M.D.   On: 03-31-15 02:46     Medical Consultants:    ID  Anti-Infectives:   Anti-infectives    Start     Dose/Rate Route Frequency Ordered Stop   03/31/2015 1200  imipenem-cilastatin (PRIMAXIN) 500 mg in sodium chloride 0.9 % 100 mL IVPB     500 mg 200 mL/hr over 30 Minutes Intravenous Every 6 hours 2015-03-31 0417     March 31, 2015 0500  DAPTOmycin (CUBICIN) 640 mg in sodium chloride 0.9 % IVPB     640 mg 225.6 mL/hr over 30 Minutes Intravenous Every 24 hours 2015-03-31 0421     March 31, 2015 0430  imipenem-cilastatin (PRIMAXIN) 500 mg in sodium chloride 0.9 % 100 mL IVPB     500 mg 200 mL/hr over 30 Minutes Intravenous  Once 03/31/2015 0416 03/31/2015 0536      Subjective:   [name] feels better with no further nausea or vomiting. Still having pain in the leg. Still running low-grade fevers.    Objective:    Filed Vitals:   03/31/2015 1548 03-31-2015 2002 2015/03/31 2110 03/27/15 0513  BP:  174/86 153/87  148/85  Pulse:   80 95  Temp:   99 F (37.2 C) 100 F (37.8 C)  TempSrc:   Oral Oral  Resp:   18 18  Height:      Weight: 107.3 kg (236 lb 8.9 oz)     SpO2:   100% 99%    Intake/Output Summary (Last 24 hours) at 03/27/15 7989 Last data filed at 03/26/15 0900  Gross per 24 hour  Intake    240 ml  Output    400 ml  Net   -160 ml    Exam: Gen:  NAD Cardiovascular:  RRR, No M/R/G Respiratory:  Lungs CTAB Gastrointestinal:  Abdomen soft, NT/ND, + BS Extremities:  As pictured below (from 03/26/15), wrapped in  Unna boot now  Right leg:   Left leg   Left leg:   Bulla, left leg:      Data Reviewed:    Labs: Basic Metabolic Panel:  Recent Labs Lab 03/22/15 0818 03/26/15 0141 03/27/15 0535  NA 142 136 135  K 3.9 4.0 4.3  CL 106 100* 100*  CO2 27 26 27   GLUCOSE 221* 152* 258*  BUN 19 10 15   CREATININE 0.90 0.64 0.74  CALCIUM 8.2* 8.3* 8.3*   GFR Estimated Creatinine Clearance: 130.3 mL/min (by C-G formula based on Cr of 0.74). Liver Function Tests: No results for input(s): AST, ALT, ALKPHOS, BILITOT, PROT, ALBUMIN in the last 168 hours. No results for input(s): LIPASE, AMYLASE in the last 168 hours. No results for input(s): AMMONIA in the last 168 hours. Coagulation profile No results for input(s): INR, PROTIME in the last 168 hours.  CBC:  Recent Labs Lab 03/22/15 0818 03/24/15 0502 03/26/15 0141 03/27/15 0535  WBC 10.2 12.2* 12.3* 9.0  NEUTROABS  --  9.2* 9.2*  --   HGB 7.5* 8.0* 7.8* 7.7*  HCT 24.1* 25.5* 25.1* 24.9*  MCV 64.1* 64.2* 63.4* 64.7*  PLT 342 413* 431* 442*   CBG:  Recent Labs Lab 03/26/15 0751 03/26/15 1152 03/26/15 1624 03/26/15 2127 03/27/15 0720  GLUCAP 172* 163* 160* 232* 228*   Sepsis Labs:  Recent Labs Lab 03/22/15 0818 03/24/15 0502 03/26/15 0141 03/26/15 0151 03/27/15 0535  WBC 10.2 12.2* 12.3*  --  9.0  LATICACIDVEN  --   --   --  0.89  --    Microbiology Recent Results (from the past 240 hour(s))  Culture, Urine     Status: None   Collection Time: 03/17/15 11:20 AM  Result Value Ref Range Status   Specimen Description URINE, CLEAN CATCH  Final   Special Requests NONE  Final   Culture   Final    NO GROWTH 1 DAY Performed at [facility]    Report Status 03/18/2015 FINAL  Final  Culture, blood (routine x 2)     Status: None (Preliminary result)   Collection Time: 03/26/15  7:12 AM  Result Value Ref Range Status   Specimen Description BLOOD LEFT ARM  Final   Special Requests   Final    BOTTLES  DRAWN AEROBIC AND ANAEROBIC 10CC Performed at [facility]    Culture PENDING  Incomplete   Report Status PENDING  Incomplete  Culture, blood (routine x 2)     Status: None (Preliminary result)   Collection Time: 03/26/15  7:17 AM  Result Value Ref Range Status   Specimen Description BLOOD RIGHT ARM  Final   Special Requests   Final    BOTTLES DRAWN AEROBIC AND ANAEROBIC  10CC Performed at [facility]    Culture PENDING  Incomplete   Report Status PENDING  Incomplete     Medications:   . aspirin EC  81 mg Oral Daily  . atorvastatin  80 mg Oral Daily  . DAPTOmycin (CUBICIN)  IV  640 mg Intravenous Q24H  . enoxaparin  0.5 mg/kg Subcutaneous Q24H  . famotidine  20 mg Oral BID  . gabapentin  300 mg Oral TID  . hydrALAZINE  25 mg Oral 3 times per day  . imipenem-cilastatin  500 mg Intravenous Q6H  . insulin aspart  0-9 Units Subcutaneous TID WC  . insulin aspart protamine- aspart  21 Units Subcutaneous BID WC  . lisinopril  10 mg Oral Daily  . meloxicam  7.5 mg Oral BID  . mirtazapine  15 mg Oral QHS  . oxybutynin  10 mg Oral QHS  . protein supplement  2 oz Oral TID  . tamsulosin  0.4 mg Oral QPC supper   Continuous Infusions:   Time spent: 25 minutes.   LOS: 1 day   [name]  [facility] Pager [phone]. If unable to reach me by pager, please call my cell phone at [phone].  *Please refer to [email or web address], password [email or web address] to get updated schedule on who will round on this patient, as hospitalists switch teams weekly. If 7PM-7AM, please contact night-coverage at [email or web address], password [email or web address] for any overnight needs.  03/27/2015, 8:07 AM

## 2015-03-27 NOTE — Progress Notes (Signed)
INFECTIOUS DISEASE PROGRESS NOTE  ID: [name] is a 59 y.o. male with  Principal Problem:   Cellulitis of left lower extremity Active Problems:   DM (diabetes mellitus), secondary, uncontrolled, with peripheral vascular complications   Leukocytosis   Hypertension   Microcytic anemia  Subjective: Cont leg pain and swelling.    Abtx:  Anti-infectives    Start     Dose/Rate Route Frequency Ordered Stop   03/26/15 1200  imipenem-cilastatin (PRIMAXIN) 500 mg in sodium chloride 0.9 % 100 mL IVPB     500 mg 200 mL/hr over 30 Minutes Intravenous Every 6 hours 03/26/15 0417     03/26/15 0500  DAPTOmycin (CUBICIN) 640 mg in sodium chloride 0.9 % IVPB     640 mg 225.6 mL/hr over 30 Minutes Intravenous Every 24 hours 03/26/15 0421     03/26/15 0430  imipenem-cilastatin (PRIMAXIN) 500 mg in sodium chloride 0.9 % 100 mL IVPB     500 mg 200 mL/hr over 30 Minutes Intravenous  Once 03/26/15 0416 03/26/15 0536      Medications:  Scheduled: . aspirin EC  81 mg Oral Daily  . atorvastatin  80 mg Oral Daily  . DAPTOmycin (CUBICIN)  IV  640 mg Intravenous Q24H  . enoxaparin  0.5 mg/kg Subcutaneous Q24H  . famotidine  20 mg Oral BID  . gabapentin  300 mg Oral TID  . hydrALAZINE  25 mg Oral 3 times per day  . imipenem-cilastatin  500 mg Intravenous Q6H  . insulin aspart  0-15 Units Subcutaneous TID WC  . insulin aspart  0-5 Units Subcutaneous QHS  . insulin aspart protamine- aspart  21 Units Subcutaneous BID WC  . lisinopril  10 mg Oral Daily  . meloxicam  7.5 mg Oral BID  . mirtazapine  15 mg Oral QHS  . oxybutynin  10 mg Oral QHS  . protein supplement  2 oz Oral TID  . tamsulosin  0.4 mg Oral QPC supper    Objective: Vital signs in last 24 hours: Temp:  [98.8 F (37.1 C)-100 F (37.8 C)] 98.8 F (37.1 C) (07/04 1512) Pulse Rate:  [80-95] 88 (07/04 1512) Resp:  [18-20] 20 (07/04 1512) BP: (148-174)/(80-87) 156/80 mmHg (07/04 1512) SpO2:  [99 %-100 %] 100 % (07/04  1512)   General appearance: alert, cooperative and no distress Extremities: BLE wrapped. his LLE has bleeding through dressing medially. there is also serous d/c on his pillow under dressing.   Lab Results  Recent Labs  03/26/15 0141 03/27/15 0535  WBC 12.3* 9.0  HGB 7.8* 7.7*  HCT 25.1* 24.9*  NA 136 135  K 4.0 4.3  CL 100* 100*  CO2 26 27  BUN 10 15  CREATININE 0.64 0.74   Liver Panel No results for input(s): PROT, ALBUMIN, AST, ALT, ALKPHOS, BILITOT, BILIDIR, IBILI in the last 72 hours. Sedimentation Rate No results for input(s): ESRSEDRATE in the last 72 hours. C-Reactive Protein No results for input(s): CRP in the last 72 hours.  Microbiology: Recent Results (from the past 240 hour(s))  Culture, blood (routine x 2)     Status: None (Preliminary result)   Collection Time: 03/26/15  7:12 AM  Result Value Ref Range Status   Specimen Description BLOOD LEFT ARM  Final   Special Requests BOTTLES DRAWN AEROBIC AND ANAEROBIC 10CC  Final   Culture   Final    NO GROWTH 1 DAY Performed at [facility]    Report Status PENDING  Incomplete  Culture,  blood (routine x 2)     Status: None (Preliminary result)   Collection Time: 03/26/15  7:17 AM  Result Value Ref Range Status   Specimen Description BLOOD RIGHT ARM  Final   Special Requests BOTTLES DRAWN AEROBIC AND ANAEROBIC 10CC  Final   Culture   Final    NO GROWTH 1 DAY Performed at [facility]    Report Status PENDING  Incomplete    Studies/Results: Dg Chest Port 1 View  03/26/2015   CLINICAL DATA:  Acute onset of generalized chest pain and shortness of breath. Initial encounter.  EXAM: PORTABLE CHEST - 1 VIEW  COMPARISON:  Chest radiograph from 03/14/2015, and CTA of the chest performed 03/17/2015  FINDINGS: The lungs are hypoexpanded. Vascular crowding and vascular congestion are seen. Mild right basilar airspace opacity could reflect mild pneumonia or possibly minimal interstitial edema. No pleural  effusion or pneumothorax is seen.  The cardiomediastinal silhouette is borderline enlarged. No acute osseous abnormalities are identified.  IMPRESSION: Lungs hypoexpanded. Vascular congestion and borderline cardiomegaly. Mild right basilar airspace opacity could reflect mild pneumonia or possibly minimal interstitial edema.   Electronically Signed   By: Garald Balding M.D.   On: 03/26/2015 02:46     Assessment/Plan: DM2, poorly controlled with complications Diabetic Foot ulcer, chronic venous stasis ulcers Sepsis Cellulitis LLE ? Pyomyositis  Day 3 dapto/imipenem  Will continue to watch eventually will need dressings off and wounds re-examined.  He would like PT        [name] Infectious Diseases (pager) [phone] [email or web address] 03/27/2015, 5:35 PM  LOS: 1 day

## 2015-03-27 NOTE — Clinical Social Work Placement (Signed)
   CLINICAL SOCIAL WORK PLACEMENT  NOTE  Date:  03/27/2015  Patient Details  Name: [name] MRN: [id number] Date of Birth: Jan 10, 1956  Clinical Social Work is seeking post-discharge placement for this patient at the [place] level of care (*CSW will initial, date and re-position this form in  chart as items are completed):  Yes   Patient/family provided with [facility] Work Department's list of facilities offering this level of care within the geographic area requested by the patient (or if unable, by the patient's family).  Yes   Patient/family informed of their freedom to choose among providers that offer the needed level of care, that participate in Medicare, Medicaid or managed care program needed by the patient, have an available bed and are willing to accept the patient.  Yes   Patient/family informed of [facility]'s ownership interest in [facility] and [facility], as well as of the fact that they are under no obligation to receive care at these facilities.  PASRR submitted to EDS on       PASRR number received on       Existing PASRR number confirmed on 03/27/15     FL2 transmitted to all facilities in geographic area requested by pt/family on 03/27/15     FL2 transmitted to all facilities within larger geographic area on       Patient informed that his/her managed care company has contracts with or will negotiate with certain facilities, including the following:            Patient/family informed of bed offers received.  Patient chooses bed at       Physician recommends and patient chooses bed at      Patient to be transferred to   on  .  Patient to be transferred to facility by       Patient family notified on   of transfer.  Name of family member notified:        PHYSICIAN       Additional Comment:    _______________________________________________ Boone Master, [place] 03/27/2015, 2:51 PM

## 2015-03-28 DIAGNOSIS — L03119 Cellulitis of unspecified part of limb: Secondary | ICD-10-CM | POA: Insufficient documentation

## 2015-03-28 DIAGNOSIS — L02419 Cutaneous abscess of limb, unspecified: Secondary | ICD-10-CM | POA: Insufficient documentation

## 2015-03-28 LAB — BASIC METABOLIC PANEL
Anion gap: 8 (ref 5–15)
BUN: 13 mg/dL (ref 6–20)
CHLORIDE: 103 mmol/L (ref 101–111)
CO2: 29 mmol/L (ref 22–32)
Calcium: 8.5 mg/dL — ABNORMAL LOW (ref 8.9–10.3)
Creatinine, Ser: 0.69 mg/dL (ref 0.61–1.24)
GFR calc Af Amer: >60 mL/min (ref >60)
GFR calc non Af Amer: >60 mL/min (ref >60)
Glucose, Bld: 151 mg/dL — ABNORMAL HIGH (ref 65–99)
POTASSIUM: 4.4 mmol/L (ref 3.5–5.1)
SODIUM: 140 mmol/L (ref 135–145)

## 2015-03-28 LAB — IRON AND TIBC
IRON: 17 ug/dL — AB (ref 45–182)
Saturation Ratios: 8 % — ABNORMAL LOW (ref 17.9–39.5)
TIBC: 211 ug/dL — ABNORMAL LOW (ref 250–450)
UIBC: 194 ug/dL

## 2015-03-28 LAB — GLUCOSE, CAPILLARY
GLUCOSE-CAPILLARY: 113 mg/dL — AB (ref 65–99)
GLUCOSE-CAPILLARY: 136 mg/dL — AB (ref 65–99)
Glucose-Capillary: 128 mg/dL — ABNORMAL HIGH (ref 65–99)
Glucose-Capillary: 184 mg/dL — ABNORMAL HIGH (ref 65–99)

## 2015-03-28 LAB — FERRITIN: Ferritin: 443 ng/mL — ABNORMAL HIGH (ref 24–336)

## 2015-03-28 LAB — CBC
HEMATOCRIT: 25.5 % — AB (ref 39.0–52.0)
Hemoglobin: 7.8 g/dL — ABNORMAL LOW (ref 13.0–17.0)
MCH: 19.8 pg — ABNORMAL LOW (ref 26.0–34.0)
MCHC: 30.6 g/dL (ref 30.0–36.0)
MCV: 64.9 fL — AB (ref 78.0–100.0)
Platelets: 448 10*3/uL — ABNORMAL HIGH (ref 150–400)
RBC: 3.93 MIL/uL — ABNORMAL LOW (ref 4.22–5.81)
RDW: 17.1 % — ABNORMAL HIGH (ref 11.5–15.5)
WBC: 8.5 10*3/uL (ref 4.0–10.5)

## 2015-03-28 MED ORDER — FERROUS SULFATE 325 (65 FE) MG PO TABS
325.0000 mg | ORAL_TABLET | Freq: Two times a day (BID) | ORAL | Status: DC
  Administered 2015-03-28 – 2015-04-04 (×15): 325 mg via ORAL
  Filled 2015-03-28 (×16): qty 1

## 2015-03-28 MED ORDER — HYDRALAZINE HCL 50 MG PO TABS
50.0000 mg | ORAL_TABLET | Freq: Three times a day (TID) | ORAL | Status: DC
  Administered 2015-03-28 – 2015-04-04 (×22): 50 mg via ORAL
  Filled 2015-03-28 (×24): qty 1

## 2015-03-28 NOTE — Progress Notes (Signed)
INFECTIOUS DISEASE PROGRESS NOTE  ID: [name] is a 59 y.o. male with  Principal Problem:   Cellulitis of left lower extremity Active Problems:   DM (diabetes mellitus), secondary, uncontrolled, with peripheral vascular complications   Leukocytosis   Hypertension   Microcytic anemia  Subjective: Without complaints  Abtx:  Anti-infectives    Start     Dose/Rate Route Frequency Ordered Stop   03/26/15 1200  imipenem-cilastatin (PRIMAXIN) 500 mg in sodium chloride 0.9 % 100 mL IVPB     500 mg 200 mL/hr over 30 Minutes Intravenous Every 6 hours 03/26/15 0417     03/26/15 0500  DAPTOmycin (CUBICIN) 640 mg in sodium chloride 0.9 % IVPB     640 mg 225.6 mL/hr over 30 Minutes Intravenous Every 24 hours 03/26/15 0421     03/26/15 0430  imipenem-cilastatin (PRIMAXIN) 500 mg in sodium chloride 0.9 % 100 mL IVPB     500 mg 200 mL/hr over 30 Minutes Intravenous  Once 03/26/15 0416 03/26/15 0536      Medications:  Scheduled: . aspirin EC  81 mg Oral Daily  . atorvastatin  80 mg Oral Daily  . DAPTOmycin (CUBICIN)  IV  640 mg Intravenous Q24H  . enoxaparin  0.5 mg/kg Subcutaneous Q24H  . famotidine  20 mg Oral BID  . ferrous sulfate  325 mg Oral BID WC  . gabapentin  300 mg Oral TID  . hydrALAZINE  50 mg Oral 3 times per day  . imipenem-cilastatin  500 mg Intravenous Q6H  . insulin aspart  0-15 Units Subcutaneous TID WC  . insulin aspart  0-5 Units Subcutaneous QHS  . insulin aspart protamine- aspart  21 Units Subcutaneous BID WC  . lisinopril  10 mg Oral Daily  . meloxicam  7.5 mg Oral BID  . mirtazapine  15 mg Oral QHS  . oxybutynin  10 mg Oral QHS  . protein supplement  2 oz Oral TID  . tamsulosin  0.4 mg Oral QPC supper    Objective: Vital signs in last 24 hours: Temp:  [97.3 F (36.3 C)-98.8 F (37.1 C)] 97.3 F (36.3 C) (07/05 1356) Pulse Rate:  [81-103] 82 (07/05 1356) Resp:  [20] 20 (07/05 1356) BP: (137-169)/(72-94) 169/91 mmHg (07/05 1356) SpO2:  [93 %-100  %] 98 % (07/05 1356)   General appearance: alert, cooperative and no distress Extremities: ulcerative lesion on RLE. LLE has pustulent drainage from os on his calf. skin breakdown.   Lab Results  Recent Labs  03/27/15 0535 03/28/15 0421  WBC 9.0 8.5  HGB 7.7* 7.8*  HCT 24.9* 25.5*  NA 135 140  K 4.3 4.4  CL 100* 103  CO2 27 29  BUN 15 13  CREATININE 0.74 0.69   Liver Panel No results for input(s): PROT, ALBUMIN, AST, ALT, ALKPHOS, BILITOT, BILIDIR, IBILI in the last 72 hours. Sedimentation Rate No results for input(s): ESRSEDRATE in the last 72 hours. C-Reactive Protein No results for input(s): CRP in the last 72 hours.  Microbiology: Recent Results (from the past 240 hour(s))  Culture, blood (routine x 2)     Status: None (Preliminary result)   Collection Time: 03/26/15  7:12 AM  Result Value Ref Range Status   Specimen Description BLOOD LEFT ARM  Final   Special Requests BOTTLES DRAWN AEROBIC AND ANAEROBIC 10CC  Final   Culture   Final    NO GROWTH 1 DAY Performed at [facility]    Report Status PENDING  Incomplete  Culture, blood (routine x 2)     Status: None (Preliminary result)   Collection Time: 03/26/15  7:17 AM  Result Value Ref Range Status   Specimen Description BLOOD RIGHT ARM  Final   Special Requests BOTTLES DRAWN AEROBIC AND ANAEROBIC 10CC  Final   Culture   Final    NO GROWTH 1 DAY Performed at [facility]    Report Status PENDING  Incomplete    Studies/Results: No results found.   Assessment/Plan: DM2, poorly controlled with complications Diabetic Foot ulcer, chronic venous stasis ulcers Sepsis Cellulitis LLE ? Pyomyositis  Day 4 dapto/imipenem      I have sent deep Cx from his pustular os Would have surgery re-visit him ( I have called).  No change in anbx.      [name] Infectious Diseases (pager) [phone] [email or web address] 03/28/2015, 2:22 PM  LOS: 2 days

## 2015-03-28 NOTE — Evaluation (Signed)
Physical Therapy Evaluation Patient Details Name: [name] MRN: [id number] DOB: March 03, 1956 Today's Date: 03/28/2015   History of Present Illness  59 yo male admitted with L LE cellulitis. Recent d/c 03/24/15 to SNF. Hx of DM, chronic LE ulcers, degenerative hip.   Clinical Impression  On eval, pt required Max assist +2 for mobility. Mobility is significantly limited by pain. Attempted sitting EOB on today. Pt unable to successfully sit EOB-increased pain and difficulty flexing trunk/hips enough to maintain sitting balance. Pain rated 8/10 especially with L LE movement and dependent positioning. Recommend return to SNF for continued rehab.     Follow Up Recommendations SNF;Supervision/Assistance - 24 hour    Equipment Recommendations  None recommended by PT    Recommendations for Other Services       Precautions / Restrictions Precautions Precautions: Fall Precaution Comments: L Leg weeping/draining with one area of bloody drainage interior lower leg , una boot bil LEs Restrictions Weight Bearing Restrictions: No      Mobility  Bed Mobility Overal bed mobility: Needs Assistance;+2 for physical assistance;+ 2 for safety/equipment Bed Mobility: Rolling;Supine to Sit;Sit to Supine Rolling: Min assist   Supine to sit: Max assist;+2 for physical assistance;+2 for safety/equipment;HOB elevated Sit to supine: +2 for physical assistance;+2 for safety/equipment;Total assist   General bed mobility comments: Assist for trunk and bil LEs. Utilized bedpad for scooting, positioning. Increased time and effort. Pain significantly limiting mobility. Max cues for pt to limit scooting to EOB and to increae trunk/hip flexion. Pt unable to get trunk to full upright position.   Transfers                 General transfer comment: Pt unable  Ambulation/Gait                Stairs            Wheelchair Mobility    Modified Rankin (Stroke Patients Only)       Balance  Overall balance assessment: Needs assistance Sitting-balance support: Bilateral upper extremity supported;Feet supported Sitting balance-Leahy Scale: Poor Sitting balance - Comments: unable to flex trunk/hips enought to sit upright and maintain  balance. Pt repeatedly pushed backwards and propped on R elbow. Despite increased time working on this, pt was unable to successfully sit EOB                                     Pertinent Vitals/Pain Pain Assessment: Faces Pain Score: 8  Pain Location: L leg Pain Descriptors / Indicators: Aching;Tightness;Burning;Grimacing Pain Intervention(s): Limited activity within patient's tolerance;Repositioned;Patient requesting pain meds-RN notified    Home Living Family/patient expects to be discharged to:: Skilled nursing facility               Home Equipment: Gilford Rile - 4 wheels;Cane - single point      Prior Function Level of Independence: Needs assistance   Gait / Transfers Assistance Needed: Has not walked in ~1-1.5 weeks per pt     Comments: uses cane or Rollator, states recently was not needing a cane, 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     Hand Dominance        Extremity/Trunk Assessment               Lower Extremity Assessment: RLE deficits/detail;LLE deficits/detail RLE Deficits / Details: hip flex at least 3/5 LLE Deficits / Details: significant edema present, requires assistance to lift leg,  unable to tolerate  knee flexion     Communication   Communication: No difficulties  Cognition Arousal/Alertness: Awake/alert Behavior During Therapy: WFL for tasks assessed/performed Overall Cognitive Status: Within Functional Limits for tasks assessed                      General Comments      Exercises        Assessment/Plan    PT Assessment Patient needs continued PT services  PT Diagnosis Difficulty walking;Generalized weakness;Acute pain   PT Problem List Decreased strength;Decreased  range of motion;Decreased activity tolerance;Decreased balance;Decreased mobility;Decreased safety awareness;Decreased knowledge of use of DME;Pain;Decreased skin integrity;Decreased knowledge of precautions  PT Treatment Interventions DME instruction;Gait training;Functional mobility training;Therapeutic activities;Therapeutic exercise;Patient/family education;Balance training   PT Goals (Current goals can be found in the Care Plan section) Acute Rehab PT Goals Patient Stated Goal: to get up and walk PT Goal Formulation: With patient Time For Goal Achievement: 04/11/15 Potential to Achieve Goals: Fair    Frequency Min 2X/week   Barriers to discharge        Co-evaluation               End of Session   Activity Tolerance: Patient limited by pain Patient left: in bed;with call bell/phone within reach;with bed alarm set           Time: 2035-5974 PT Time Calculation (min) (ACUTE ONLY): 13 min   Charges:   PT Evaluation $Initial PT Evaluation Tier I: 1 Procedure     PT G Codes:        Weston Anna, MPT Pager: [phone]

## 2015-03-28 NOTE — Consult Note (Addendum)
WOC wound follow up Wound type: Chronic venous insufficiency; Clinical CEAP 6 bilaterally. Measurement: RLE: Unchanged.  LLE with bullae now reabsorbed and ulcer reepithelializing. Drainage (amount, consistency, odor) Scant serous Periwound:intact, with evidence of resolving edema. Dressing procedure/placement/frequency: Bilateral Unna's boots to be reapplied today.  Will change next Tuesday, 04/04/15 if patient is still an inpatient.  If not and if you agree, please refer to an outpatient wound care center for continued management of the bilateral chronic venous insufficiency with ulcerations as previously noted by my partner on 03/26/15. [place] nursing team will not follow, but will remain available to this patient, the nursing and medical team.  Please re-consult if needed. Thanks, Maudie Flakes, MSN, RN, [place], [place], [place] [phone])

## 2015-03-28 NOTE — Progress Notes (Signed)
[facility] PROGRESS NOTE  [name] JAS:[id number] DOB: 1955-10-09 DOA: 03/26/2015 PCP: Charlsie Merles, MD  Brief narrative 59 year old male with uncontrolled type 2 diabetes mellitus and chronic right lower extremity diabetic ulcer with recent hospitalization from 6/21-03/24/2015 with left lower leg cellulitis which was treated with postoperative antibiotics and discharged on Zyvox and Augmentin was recently admitted to the hospital on 03/29/15 with persistent cellulitis of left leg and worsening of his right leg wounds.   Assessment/Plan: Complicated cellulitis of left lower extremity On daptomycin and Primaxin since admission. ID following. Dressing change possibly today. Pain control with when necessary Percocet and dilaudid. Blood cultures so far negative. Pending wound culture results. keep legs elevated at all times. Check CPK weekly while on daptomycin.   Uncontrolled type 2 diabetes mellitus with peripheral vascular disease Blood glucose well controlled on 70/30 insulin (21 units bid)  Continue with sliding scale insulin  Essential hypertension Continue lisinopril and hydralazine. blood pressure elevated. Will increase hydralazine dose.  Microcytic anemia Iron panel suggests iron deficiency. Will start on supplements.  Depression  continue remeron.   DVT prophylaxis: Subcutaneous Lovenox  Diet: diabetic   Code Status: full code Family Communication: None at bedside  Disposition Plan: Patient interested in going to skilled nursing facility at [facility]. Chest manager will send out referrals. Not ready for discharge yet. May likely need long term IV abx on discharge.    Consultants:  ID  Procedures:  none  Antibiotics:  IV daptomycin and Primaxin since 7/3  HPI/Subjective: Patient seen and examined. Denies any pain in the legs. Noted for soaking of left leg dressing. Afebrile past 24 hrs  Objective: Filed Vitals:   03/28/15 0527  BP: 169/85   Pulse: 81  Temp: 98.7 F (37.1 C)  Resp: 20    Intake/Output Summary (Last 24 hours) at 03/28/15 1346 Last data filed at 03/28/15 1200  Gross per 24 hour  Intake    360 ml  Output    900 ml  Net   -540 ml   Filed Weights   03/26/15 0616 03/26/15 1548  Weight: 107.7 kg (237 lb 7 oz) 107.3 kg (236 lb 8.9 oz)    Exam:   General:  Middle aged  male in no acute distress  HEENT: moist oral mucosa, supple neck  Chest: Clear to auscultation bilaterally  Cardiovascular: Normal S1 and S2, no murmurs rub or gallop  GI: Soft, nondistended, nontender, bowel sounds present  Musculoskeletal: Dressing over bilateral legs with soaking of the left leg dressing  CNS: Alert and oriented  Data Reviewed: Basic Metabolic Panel:  Recent Labs Lab 03/22/15 0818 03/26/15 0141 03/27/15 0535 03/28/15 0421  NA 142 136 135 140  K 3.9 4.0 4.3 4.4  CL 106 100* 100* 103  CO2 27 26 27 29   GLUCOSE 221* 152* 258* 151*  BUN 19 10 15 13   CREATININE 0.90 0.64 0.74 0.69  CALCIUM 8.2* 8.3* 8.3* 8.5*   Liver Function Tests: No results for input(s): AST, ALT, ALKPHOS, BILITOT, PROT, ALBUMIN in the last 168 hours. No results for input(s): LIPASE, AMYLASE in the last 168 hours. No results for input(s): AMMONIA in the last 168 hours. CBC:  Recent Labs Lab 03/22/15 0818 03/24/15 0502 03/26/15 0141 03/27/15 0535 03/28/15 0421  WBC 10.2 12.2* 12.3* 9.0 8.5  NEUTROABS  --  9.2* 9.2*  --   --   HGB 7.5* 8.0* 7.8* 7.7* 7.8*  HCT 24.1* 25.5* 25.1* 24.9* 25.5*  MCV 64.1* 64.2* 63.4* 64.7*  64.9*  PLT 342 413* 431* 442* 448*   Cardiac Enzymes:  Recent Labs Lab 03/26/15 1700 03/27/15 0535  CKTOTAL 104 70   BNP (last 3 results) No results for input(s): BNP in the last 8760 hours.  ProBNP (last 3 results) No results for input(s): PROBNP in the last 8760 hours.  CBG:  Recent Labs Lab 03/27/15 1212 03/27/15 1709 03/27/15 2156 03/28/15 0736 03/28/15 1200  GLUCAP 152* 132* 138* 128*  113*    Recent Results (from the past 240 hour(s))  Culture, blood (routine x 2)     Status: None (Preliminary result)   Collection Time: 03/26/15  7:12 AM  Result Value Ref Range Status   Specimen Description BLOOD LEFT ARM  Final   Special Requests BOTTLES DRAWN AEROBIC AND ANAEROBIC 10CC  Final   Culture   Final    NO GROWTH 1 DAY Performed at [facility]    Report Status PENDING  Incomplete  Culture, blood (routine x 2)     Status: None (Preliminary result)   Collection Time: 03/26/15  7:17 AM  Result Value Ref Range Status   Specimen Description BLOOD RIGHT ARM  Final   Special Requests BOTTLES DRAWN AEROBIC AND ANAEROBIC 10CC  Final   Culture   Final    NO GROWTH 1 DAY Performed at [facility]    Report Status PENDING  Incomplete     Studies: No results found.  Scheduled Meds: . aspirin EC  81 mg Oral Daily  . atorvastatin  80 mg Oral Daily  . DAPTOmycin (CUBICIN)  IV  640 mg Intravenous Q24H  . enoxaparin  0.5 mg/kg Subcutaneous Q24H  . famotidine  20 mg Oral BID  . gabapentin  300 mg Oral TID  . hydrALAZINE  25 mg Oral 3 times per day  . imipenem-cilastatin  500 mg Intravenous Q6H  . insulin aspart  0-15 Units Subcutaneous TID WC  . insulin aspart  0-5 Units Subcutaneous QHS  . insulin aspart protamine- aspart  21 Units Subcutaneous BID WC  . lisinopril  10 mg Oral Daily  . meloxicam  7.5 mg Oral BID  . mirtazapine  15 mg Oral QHS  . oxybutynin  10 mg Oral QHS  . protein supplement  2 oz Oral TID  . tamsulosin  0.4 mg Oral QPC supper   Continuous Infusions:      Time spent: 25 minutes    Varetta Chavers, [place]  [facility] Pager [phone]. If 7PM-7AM, please contact night-coverage at [email or web address], password [facility] 03/28/2015, 1:46 PM  LOS: 2 days

## 2015-03-28 NOTE — Progress Notes (Signed)
Clinical Social Work  [employer] called and reports no available beds at [facility] but that they will provide contract for [facility]. [place] faxed list to CSW for patient to have options. Patient prefers placement at [facility] or [facility]. CSW spoke with facilities and faxed referral.  [facility] asked CSW to contact Wells Guiles at [phone] ext [phone] when facility is chosen.  [place], [place] [phone]

## 2015-03-28 NOTE — Care Management (Signed)
Letter given to [employer]  Patient Details  Name: [name] MRN: [id number] Date of Birth: July 16, 1956   Medicare Important Message Given:  Yes-second notification given    Camillo Flaming, LCSW 03/28/2015, 1:52 PM

## 2015-03-28 NOTE — Progress Notes (Signed)
Clinical Social Work  CSW received return call from [place] [phone]) who reports patient sounds like a good candidate for [facility] SNF at [facility]. [facility] asked CSW to contact [facility] transfer coordinator Orie Fisherman [phone] ext [phone]) for further details. [facility] agreeable to review patient's information so CSW faxed referral packet.  Patient is happy that [facility] is considering him and reports that Tamika appears to have his best interest in mind so he wants to follow her advice.  CSW will continue to follow.  [place], [place] [phone]

## 2015-03-29 DIAGNOSIS — L02419 Cutaneous abscess of limb, unspecified: Secondary | ICD-10-CM

## 2015-03-29 DIAGNOSIS — L03119 Cellulitis of unspecified part of limb: Secondary | ICD-10-CM

## 2015-03-29 DIAGNOSIS — E1351 Other specified diabetes mellitus with diabetic peripheral angiopathy without gangrene: Secondary | ICD-10-CM

## 2015-03-29 DIAGNOSIS — I10 Essential (primary) hypertension: Secondary | ICD-10-CM

## 2015-03-29 DIAGNOSIS — L03116 Cellulitis of left lower limb: Secondary | ICD-10-CM

## 2015-03-29 LAB — GLUCOSE, CAPILLARY
GLUCOSE-CAPILLARY: 180 mg/dL — AB (ref 65–99)
GLUCOSE-CAPILLARY: 193 mg/dL — AB (ref 65–99)
Glucose-Capillary: 184 mg/dL — ABNORMAL HIGH (ref 65–99)
Glucose-Capillary: 100 mg/dL — ABNORMAL HIGH (ref 65–99)

## 2015-03-29 NOTE — Progress Notes (Signed)
INFECTIOUS DISEASE PROGRESS NOTE  ID: [name] is a 59 y.o. male with  Principal Problem:   Cellulitis of left lower extremity Active Problems:   DM (diabetes mellitus), secondary, uncontrolled, with peripheral vascular complications   Leukocytosis   Hypertension   Microcytic anemia   Cellulitis and abscess of leg  Subjective: Without complaints  Abtx:  Anti-infectives    Start     Dose/Rate Route Frequency Ordered Stop   03/26/15 1200  imipenem-cilastatin (PRIMAXIN) 500 mg in sodium chloride 0.9 % 100 mL IVPB     500 mg 200 mL/hr over 30 Minutes Intravenous Every 6 hours 03/26/15 0417     03/26/15 0500  DAPTOmycin (CUBICIN) 640 mg in sodium chloride 0.9 % IVPB     640 mg 225.6 mL/hr over 30 Minutes Intravenous Every 24 hours 03/26/15 0421     03/26/15 0430  imipenem-cilastatin (PRIMAXIN) 500 mg in sodium chloride 0.9 % 100 mL IVPB     500 mg 200 mL/hr over 30 Minutes Intravenous  Once 03/26/15 0416 03/26/15 0536      Medications:  Scheduled: . aspirin EC  81 mg Oral Daily  . atorvastatin  80 mg Oral Daily  . DAPTOmycin (CUBICIN)  IV  640 mg Intravenous Q24H  . enoxaparin  0.5 mg/kg Subcutaneous Q24H  . famotidine  20 mg Oral BID  . ferrous sulfate  325 mg Oral BID WC  . gabapentin  300 mg Oral TID  . hydrALAZINE  50 mg Oral 3 times per day  . imipenem-cilastatin  500 mg Intravenous Q6H  . insulin aspart  0-15 Units Subcutaneous TID WC  . insulin aspart  0-5 Units Subcutaneous QHS  . insulin aspart protamine- aspart  21 Units Subcutaneous BID WC  . lisinopril  10 mg Oral Daily  . meloxicam  7.5 mg Oral BID  . mirtazapine  15 mg Oral QHS  . oxybutynin  10 mg Oral QHS  . protein supplement  2 oz Oral TID  . tamsulosin  0.4 mg Oral QPC supper    Objective: Vital signs in last 24 hours: Temp:  [98.7 F (37.1 C)-99.6 F (37.6 C)] 98.7 F (37.1 C) (07/06 0947) Pulse Rate:  [80-89] 89 (07/06 0608) Resp:  [19-21] 19 (07/06 0608) BP: (142-156)/(77-96) 142/96  mmHg (07/06 0608) SpO2:  [97 %-100 %] 100 % (07/06 0962)   General appearance: alert, cooperative and no distress Extremities: LLE with copious pustulent d/c. tender.   Lab Results  Recent Labs  03/27/15 0535 03/28/15 0421  WBC 9.0 8.5  HGB 7.7* 7.8*  HCT 24.9* 25.5*  NA 135 140  K 4.3 4.4  CL 100* 103  CO2 27 29  BUN 15 13  CREATININE 0.74 0.69   Liver Panel No results for input(s): PROT, ALBUMIN, AST, ALT, ALKPHOS, BILITOT, BILIDIR, IBILI in the last 72 hours. Sedimentation Rate No results for input(s): ESRSEDRATE in the last 72 hours. C-Reactive Protein No results for input(s): CRP in the last 72 hours.  Microbiology: Recent Results (from the past 240 hour(s))  Culture, blood (routine x 2)     Status: None (Preliminary result)   Collection Time: 03/26/15  7:12 AM  Result Value Ref Range Status   Specimen Description BLOOD LEFT ARM  Final   Special Requests BOTTLES DRAWN AEROBIC AND ANAEROBIC 10CC  Final   Culture   Final    NO GROWTH 3 DAYS Performed at [facility]    Report Status PENDING  Incomplete  Culture, blood (  routine x 2)     Status: None (Preliminary result)   Collection Time: 03/26/15  7:17 AM  Result Value Ref Range Status   Specimen Description BLOOD RIGHT ARM  Final   Special Requests BOTTLES DRAWN AEROBIC AND ANAEROBIC 10CC  Final   Culture   Final    NO GROWTH 3 DAYS Performed at [facility]    Report Status PENDING  Incomplete  Wound culture     Status: None (Preliminary result)   Collection Time: 03/28/15  2:00 PM  Result Value Ref Range Status   Specimen Description LEG LEFT  Final   Special Requests Normal  Final   Gram Stain   Final    RARE WBC PRESENT,BOTH PMN AND MONONUCLEAR NO SQUAMOUS EPITHELIAL CELLS SEEN NO ORGANISMS SEEN Performed at [facility]    Culture PENDING  Incomplete   Report Status PENDING  Incomplete    Studies/Results: No results found.   Assessment/Plan: DM2, poorly controlled  with complications Diabetic Foot ulcer, chronic venous stasis ulcers Sepsis Cellulitis LLE ? Pyomyositis  Day 5 dapto/imipenem  Wound Cx pending.  Repeat MRI if not improving My great appreciation to Ortho.          [name] Infectious Diseases (pager) [phone] [email or web address] 03/29/2015, 4:14 PM  LOS: 3 days

## 2015-03-29 NOTE — Consult Note (Signed)
Reason for Consult: [name] Referring Physician: Dr. Creig Hines Linder is an 59 y.o. male.  HPI: Pt readmitted with ongoing [name] not responding to IV abx. Wound care and ID following. They have asked for ortho to re-evaluate. Dr. Wynelle Link saw the pt last admission 6/24 and pt had multiple MRI's negative for abscess or osteomyelitis (most recent MRI done 8 days ago). Currently afebrile.  Past Medical History  Diagnosis Date  . Diabetes mellitus without complication   . Hypertension   . Chronic hip pain   . GERD (gastroesophageal reflux disease)   . Depression     Past Surgical History  Procedure Laterality Date  . Rotator cuff repair    . Hernia repair      Family History  Problem Relation Age of Onset  . Diabetes Mellitus II Mother   . Diabetes Mellitus II Father   . Diabetes Mellitus II Brother     Social History:  reports that he has been smoking.  He does not have any smokeless tobacco history on file. He reports that he drinks alcohol. He reports that he does not use illicit drugs.  Allergies:  Allergies  Allergen Reactions  . Other     Pt is a Jehovah Witness. No blood products.  . Sulfa Antibiotics     Causes flu-like symptom : sweating, chills, fever, body aches    Medications: I have reviewed the patient's current medications.  Results for orders placed or performed during the hospital encounter of 03/26/15 (from the past 48 hour(s))  Glucose, capillary     Status: Abnormal   Collection Time: 03/27/15 12:12 PM  Result Value Ref Range   Glucose-Capillary 152 (H) 65 - 99 mg/dL  Glucose, capillary     Status: Abnormal   Collection Time: 03/27/15  5:09 PM  Result Value Ref Range   Glucose-Capillary 132 (H) 65 - 99 mg/dL  Glucose, capillary     Status: Abnormal   Collection Time: 03/27/15  9:56 PM  Result Value Ref Range   Glucose-Capillary 138 (H) 65 - 99 mg/dL   Comment 1 Notify RN   CBC     Status: Abnormal   Collection Time: 03/28/15   4:21 AM  Result Value Ref Range   WBC 8.5 4.0 - 10.5 K/uL   RBC 3.93 (L) 4.22 - 5.81 MIL/uL   Hemoglobin 7.8 (L) 13.0 - 17.0 g/dL   HCT 25.5 (L) 39.0 - 52.0 %   MCV 64.9 (L) 78.0 - 100.0 fL   MCH 19.8 (L) 26.0 - 34.0 pg   MCHC 30.6 30.0 - 36.0 g/dL   RDW 17.1 (H) 11.5 - 15.5 %   Platelets 448 (H) 150 - 400 K/uL  Basic metabolic panel     Status: Abnormal   Collection Time: 03/28/15  4:21 AM  Result Value Ref Range   Sodium 140 135 - 145 mmol/L   Potassium 4.4 3.5 - 5.1 mmol/L   Chloride 103 101 - 111 mmol/L   CO2 29 22 - 32 mmol/L   Glucose, Bld 151 (H) 65 - 99 mg/dL   BUN 13 6 - 20 mg/dL   Creatinine, Ser 0.69 0.61 - 1.24 mg/dL   Calcium 8.5 (L) 8.9 - 10.3 mg/dL   GFR calc non Af Amer >60 >60 mL/min   GFR calc Af Amer >60 >60 mL/min    Comment: (NOTE) The eGFR has been calculated using the CKD EPI equation. This calculation has not been validated in all clinical situations.  eGFR's persistently <60 mL/min signify possible Chronic Kidney Disease.    Anion gap 8 5 - 15  Ferritin     Status: Abnormal   Collection Time: 03/28/15  4:24 AM  Result Value Ref Range   Ferritin 443 (H) 24 - 336 ng/mL    Comment: Performed at [facility]  Iron and TIBC     Status: Abnormal   Collection Time: 03/28/15  4:24 AM  Result Value Ref Range   Iron 17 (L) 45 - 182 ug/dL   TIBC 211 (L) 250 - 450 ug/dL   Saturation Ratios 8 (L) 17.9 - 39.5 %   UIBC 194 ug/dL    Comment: Performed at [facility]  Glucose, capillary     Status: Abnormal   Collection Time: 03/28/15  7:36 AM  Result Value Ref Range   Glucose-Capillary 128 (H) 65 - 99 mg/dL   Comment 1 Notify RN   Glucose, capillary     Status: Abnormal   Collection Time: 03/28/15 12:00 PM  Result Value Ref Range   Glucose-Capillary 113 (H) 65 - 99 mg/dL  Wound culture     Status: None (Preliminary result)   Collection Time: 03/28/15  2:00 PM  Result Value Ref Range   Specimen Description LEG LEFT    Special Requests  Normal    Gram Stain      RARE WBC PRESENT,BOTH PMN AND MONONUCLEAR NO SQUAMOUS EPITHELIAL CELLS SEEN NO ORGANISMS SEEN Performed at [facility]    Culture PENDING    Report Status PENDING   Glucose, capillary     Status: Abnormal   Collection Time: 03/28/15  5:33 PM  Result Value Ref Range   Glucose-Capillary 184 (H) 65 - 99 mg/dL   Comment 1 Notify RN   Glucose, capillary     Status: Abnormal   Collection Time: 03/28/15  9:20 PM  Result Value Ref Range   Glucose-Capillary 136 (H) 65 - 99 mg/dL  Glucose, capillary     Status: Abnormal   Collection Time: 03/29/15  7:16 AM  Result Value Ref Range   Glucose-Capillary 184 (H) 65 - 99 mg/dL   Comment 1 Notify RN     No results found.  Review of Systems  HENT: Negative.   Respiratory: Negative.   Cardiovascular: Negative.   Gastrointestinal: Negative.   Genitourinary: Negative.   Musculoskeletal: Positive for myalgias and joint pain.   Blood pressure 142/96, pulse 89, temperature 98.7 F (37.1 C), temperature source Oral, resp. rate 19, height 6' 4"  (1.93 m), weight 107.3 kg (236 lb 8.9 oz), SpO2 100 %. Physical Exam  Constitutional: He is oriented to person, place, and time. He appears well-developed.  HENT:  Head: Normocephalic.  Eyes: Pupils are equal, round, and reactive to light.  Neck: Normal range of motion.  Cardiovascular: Normal rate.   Respiratory: Effort normal.  GI: Soft.  Musculoskeletal:  Unna boot removed. Proximal medial calf two 1cm lesions draining fatty necrosis purulent. Calf soft. Surrounding subq area decompressed measuring 4 by 4 cm. Knee stiff but no sign of septic arthritis.    Neurological: He is alert and oriented to person, place, and time.    Assessment/Plan: [name] resolving per nursing. Draining subqutaneous infection with fatty necrosis. Recommend d/c [employer] for now. Wound care I&D irrigation and packing dress and secure with Ace. Antibiotics per recent culture  when available.  MRI if does not respond to local care. From discussion with patient and Dr. Maureen Ralphs still better  than initial presentation. Discussed with Primary service.  Continue IV abx per ID D/C Unna boot Wound care for daily irrigation and packing May need to update MRI's lower leg and foot to r/o osteomyelitis but will hold for now  BISSELL, JACLYN M. 03/29/2015, 9:01 AM

## 2015-03-29 NOTE — Clinical Documentation Improvement (Signed)
Presents with cellulitis of left calf with uncontrolled dm2 with chronic skin ulcer.   Sepsis is documented by ID  Being treated with IV Cubicin and Vancomycin  Please clarify if you're in agreement with ID and document findings in next progress note and include in discharge summary if applicable.  _______Other Condition__________________ _______Cannot Clinically Determine   Thank You, Zoila Shutter ,RN Clinical Documentation Specialist:  [facility] [employer]

## 2015-03-29 NOTE — Progress Notes (Signed)
ANTIBIOTIC CONSULT NOTE - Follow-Up  Pharmacy Consult for Primaxin & Cubicin Indication: Cellulitis/Sepsis  Allergies  Allergen Reactions  . Other     Pt is a Jehovah Witness. No blood products.  . Sulfa Antibiotics     Causes flu-like symptom : sweating, chills, fever, body aches    Patient Measurements: Height: 6\' 4"  (193 cm) Weight: 236 lb 8.9 oz (107.3 kg) IBW/kg (Calculated) : 86.8  Vital Signs: Temp: 98.7 F (37.1 C) (07/06 0608) Temp Source: Oral (07/06 0608) BP: 142/96 mmHg (07/06 0608) Pulse Rate: 89 (07/06 0608) Intake/Output from previous day: 07/05 0701 - 07/06 0700 In: 840 [P.O.:840] Out: 1750 [Urine:1750] Intake/Output from this shift:    Labs:  Recent Labs  03/27/15 0535 03/28/15 0421  WBC 9.0 8.5  HGB 7.7* 7.8*  PLT 442* 448*  CREATININE 0.74 0.69   Estimated Creatinine Clearance: 130.3 mL/min (by C-G formula based on Cr of 0.69). No results for input(s): VANCOTROUGH, VANCOPEAK, VANCORANDOM, GENTTROUGH, GENTPEAK, GENTRANDOM, TOBRATROUGH, TOBRAPEAK, TOBRARND, AMIKACINPEAK, AMIKACINTROU, AMIKACIN in the last 72 hours.   Microbiology: Recent Results (from the past 720 hour(s))  Urine culture     Status: None   Collection Time: 03/14/15  7:39 PM  Result Value Ref Range Status   Specimen Description URINE, CLEAN CATCH  Final   Special Requests NONE  Final   Culture   Final    MULTIPLE SPECIES PRESENT, SUGGEST RECOLLECTION IF CLINICALLY INDICATED Performed at [facility]    Report Status 03/16/2015 FINAL  Final  Blood culture (routine x 2)     Status: None   Collection Time: 03/14/15  7:55 PM  Result Value Ref Range Status   Specimen Description BLOOD RIGHT FOREARM  Final   Special Requests BOTTLES DRAWN AEROBIC AND ANAEROBIC 5CC  Final   Culture   Final    NO GROWTH 5 DAYS Performed at [facility]    Report Status 03/19/2015 FINAL  Final  Blood culture (routine x 2)     Status: None   Collection Time: 03/14/15  7:55 PM   Result Value Ref Range Status   Specimen Description BLOOD RIGHT ANTECUBITAL  Final   Special Requests BOTTLES DRAWN AEROBIC AND ANAEROBIC 5ML  Final   Culture   Final    NO GROWTH 5 DAYS Performed at [facility]    Report Status 03/19/2015 FINAL  Final  MRSA PCR Screening     Status: None   Collection Time: 03/15/15  9:28 PM  Result Value Ref Range Status   MRSA by PCR NEGATIVE NEGATIVE Final    Comment:        The GeneXpert MRSA Assay (FDA approved for NASAL specimens only), is one component of a comprehensive MRSA colonization surveillance program. It is not intended to diagnose MRSA infection nor to guide or monitor treatment for MRSA infections.   Culture, blood (routine x 2)     Status: None   Collection Time: 03/16/15  7:24 PM  Result Value Ref Range Status   Specimen Description BLOOD BLOOD LEFT ARM  Final   Special Requests BOTTLES DRAWN AEROBIC AND ANAEROBIC 5ML  Final   Culture   Final    NO GROWTH 5 DAYS Performed at [facility]    Report Status 03/21/2015 FINAL  Final  Culture, blood (routine x 2)     Status: None   Collection Time: 03/16/15  8:12 PM  Result Value Ref Range Status   Specimen Description BLOOD BLOOD LEFT WRIST  Final   Special Requests BOTTLES DRAWN AEROBIC AND ANAEROBIC 5ML  Final   Culture   Final    NO GROWTH 5 DAYS Performed at [facility]    Report Status 03/21/2015 FINAL  Final  Culture, Urine     Status: None   Collection Time: 03/17/15 11:20 AM  Result Value Ref Range Status   Specimen Description URINE, CLEAN CATCH  Final   Special Requests NONE  Final   Culture   Final    NO GROWTH 1 DAY Performed at [facility]    Report Status 03/18/2015 FINAL  Final  Culture, blood (routine x 2)     Status: None (Preliminary result)   Collection Time: 03/26/15  7:12 AM  Result Value Ref Range Status   Specimen Description BLOOD LEFT ARM  Final   Special Requests BOTTLES DRAWN AEROBIC AND ANAEROBIC 10CC   Final   Culture   Final    NO GROWTH 2 DAYS Performed at [facility]    Report Status PENDING  Incomplete  Culture, blood (routine x 2)     Status: None (Preliminary result)   Collection Time: 03/26/15  7:17 AM  Result Value Ref Range Status   Specimen Description BLOOD RIGHT ARM  Final   Special Requests BOTTLES DRAWN AEROBIC AND ANAEROBIC 10CC  Final   Culture   Final    NO GROWTH 2 DAYS Performed at [facility]    Report Status PENDING  Incomplete    Medical History: Past Medical History  Diagnosis Date  . Diabetes mellitus without complication   . Hypertension   . Chronic hip pain   . GERD (gastroesophageal reflux disease)   . Depression     Assessment: 59 y/o M with recent hospitaliation for sepsis with left lower extremity cellulitis.  Patient was placed on broad spectrum antibiotics and eventually discharged on Augmentin and Zyvox per ID recs.  MRI done during that hospitalization was negative for osteomyelitis or abscess.  Since discharge on 7/1, patient noted to have fever, pain/drainage from LLE and therefore presented back to [facility] ED 7/3.  Per ID consult, Primaxin and Daptomycin recommended on this admission.  Pharmacy consulted to assist with dosing of antibiotics.    7/3 >> Daptomycin >> 7/3 >>Primaxin >>   7/3 blood x 2: NGTD 7/5 left leg wound: sent  Tmax 99.33F WBC improved to WNL SCr WNL, stable at 0.69 (7/5) CK: 104 (7/3), 70 (7/4)  Goal of Therapy:  Appropriate antibiotic dosing for renal function and indication Eradication of infection  Plan:  Continue Primaxin 500mg  IV q6h. Continue Daptomycin 640mg  (6 mg/kg) IV q24h. Follow weekly CK while on Daptomycin and statin therapy. Monitor renal function, cultures, clinical course.    Lindell Spar, PharmD, BCPS Pager: [phone] 03/29/2015 7:40 AM

## 2015-03-29 NOTE — Progress Notes (Signed)
[facility] PROGRESS NOTE  [name] JME:[id number] DOB: [birth year] DOA: 03/26/2015 PCP: Charlsie Merles, MD  Brief narrative 59 year old male with uncontrolled type 2 diabetes mellitus and chronic right lower extremity diabetic ulcer with recent hospitalization from 6/21-03/24/2015 with left lower leg cellulitis which was treated with postoperative antibiotics and discharged on Zyvox and Augmentin was recently admitted to the hospital on 03/29/15 with persistent cellulitis of left leg and worsening of his right leg wounds.  Assessment/Plan: Complicated cellulitis of left lower extremity Placed on daptomycin and Primaxin since admission per ID. ID following.  Pain control with when necessary Percocet and dilaudid. Blood cultures so far negative. Wound culture without growth thus far. Would cont to check CPK weekly while on daptomycin.   Uncontrolled type 2 diabetes mellitus with peripheral vascular disease Blood glucose well controlled on 70/30 insulin (21 units bid)  Continue with sliding scale insulin  Essential hypertension Continue lisinopril and hydralazine. blood pressure elevated. Will increase hydralazine dose.  Microcytic anemia Iron panel suggests iron deficiency. Will start on supplements.  Depression continue remeron.  Code Status: Full Family Communication: Pt in room (indicate person spoken with, relationship, and if by phone, the number) Disposition Plan: Pending   Consultants:  Orthopedic Surgery  WOC  ID  Procedures:    Antibiotics:  IV daptomycin and Primaxin 7/3>>>  HPI/Subjective: States feeling better today. No complaints.  Objective: Filed Vitals:   03/28/15 0527 03/28/15 1356 03/28/15 2116 03/29/15 0608  BP: 169/85 169/91 156/77 142/96  Pulse: 81 82 80 89  Temp: 98.7 F (37.1 C) 97.3 F (36.3 C) 99.6 F (37.6 C) 98.7 F (37.1 C)  TempSrc: Oral Oral Oral Oral  Resp: 20 20 21 19   Height:      Weight:      SpO2: 97% 98%  97% 100%    Intake/Output Summary (Last 24 hours) at 03/29/15 1344 Last data filed at 03/29/15 0818  Gross per 24 hour  Intake    840 ml  Output   1400 ml  Net   -560 ml   Filed Weights   03/26/15 0616 03/26/15 1548  Weight: 107.7 kg (237 lb 7 oz) 107.3 kg (236 lb 8.9 oz)    Exam:   General:  Awake, in nad  Cardiovascular: regular, s1, s2  Respiratory: normal resp effort, no wheezing  Abdomen: soft,nondistended  Musculoskeletal: perfused, B LE with dressings in place   Data Reviewed: Basic Metabolic Panel:  Recent Labs Lab 03/26/15 0141 03/27/15 0535 03/28/15 0421  NA 136 135 140  K 4.0 4.3 4.4  CL 100* 100* 103  CO2 26 27 29   GLUCOSE 152* 258* 151*  BUN 10 15 13   CREATININE 0.64 0.74 0.69  CALCIUM 8.3* 8.3* 8.5*   Liver Function Tests: No results for input(s): AST, ALT, ALKPHOS, BILITOT, PROT, ALBUMIN in the last 168 hours. No results for input(s): LIPASE, AMYLASE in the last 168 hours. No results for input(s): AMMONIA in the last 168 hours. CBC:  Recent Labs Lab 03/24/15 0502 03/26/15 0141 03/27/15 0535 03/28/15 0421  WBC 12.2* 12.3* 9.0 8.5  NEUTROABS 9.2* 9.2*  --   --   HGB 8.0* 7.8* 7.7* 7.8*  HCT 25.5* 25.1* 24.9* 25.5*  MCV 64.2* 63.4* 64.7* 64.9*  PLT 413* 431* 442* 448*   Cardiac Enzymes:  Recent Labs Lab 03/26/15 1700 03/27/15 0535  CKTOTAL 104 70   BNP (last 3 results) No results for input(s): BNP in the last 8760 hours.  ProBNP (last 3 results) No  results for input(s): PROBNP in the last 8760 hours.  CBG:  Recent Labs Lab 03/28/15 1200 03/28/15 1733 03/28/15 2120 03/29/15 0716 03/29/15 1157  GLUCAP 113* 184* 136* 184* 180*    Recent Results (from the past 240 hour(s))  Culture, blood (routine x 2)     Status: None (Preliminary result)   Collection Time: 03/26/15  7:12 AM  Result Value Ref Range Status   Specimen Description BLOOD LEFT ARM  Final   Special Requests BOTTLES DRAWN AEROBIC AND ANAEROBIC 10CC   Final   Culture   Final    NO GROWTH 3 DAYS Performed at [facility]    Report Status PENDING  Incomplete  Culture, blood (routine x 2)     Status: None (Preliminary result)   Collection Time: 03/26/15  7:17 AM  Result Value Ref Range Status   Specimen Description BLOOD RIGHT ARM  Final   Special Requests BOTTLES DRAWN AEROBIC AND ANAEROBIC 10CC  Final   Culture   Final    NO GROWTH 3 DAYS Performed at [facility]    Report Status PENDING  Incomplete  Wound culture     Status: None (Preliminary result)   Collection Time: 03/28/15  2:00 PM  Result Value Ref Range Status   Specimen Description LEG LEFT  Final   Special Requests Normal  Final   Gram Stain   Final    RARE WBC PRESENT,BOTH PMN AND MONONUCLEAR NO SQUAMOUS EPITHELIAL CELLS SEEN NO ORGANISMS SEEN Performed at [facility]    Culture PENDING  Incomplete   Report Status PENDING  Incomplete     Studies: No results found.  Scheduled Meds: . aspirin EC  81 mg Oral Daily  . atorvastatin  80 mg Oral Daily  . DAPTOmycin (CUBICIN)  IV  640 mg Intravenous Q24H  . enoxaparin  0.5 mg/kg Subcutaneous Q24H  . famotidine  20 mg Oral BID  . ferrous sulfate  325 mg Oral BID WC  . gabapentin  300 mg Oral TID  . hydrALAZINE  50 mg Oral 3 times per day  . imipenem-cilastatin  500 mg Intravenous Q6H  . insulin aspart  0-15 Units Subcutaneous TID WC  . insulin aspart  0-5 Units Subcutaneous QHS  . insulin aspart protamine- aspart  21 Units Subcutaneous BID WC  . lisinopril  10 mg Oral Daily  . meloxicam  7.5 mg Oral BID  . mirtazapine  15 mg Oral QHS  . oxybutynin  10 mg Oral QHS  . protein supplement  2 oz Oral TID  . tamsulosin  0.4 mg Oral QPC supper   Continuous Infusions:   Principal Problem:   Cellulitis of left lower extremity Active Problems:   DM (diabetes mellitus), secondary, uncontrolled, with peripheral vascular complications   Leukocytosis   Hypertension   Microcytic anemia    Cellulitis and abscess of leg    [facility], [facility] Pager [phone]. If 7PM-7AM, please contact night-coverage at [email or web address], password [facility] 03/29/2015, 1:44 PM  LOS: 3 days

## 2015-03-30 DIAGNOSIS — D509 Iron deficiency anemia, unspecified: Secondary | ICD-10-CM

## 2015-03-30 LAB — GLUCOSE, CAPILLARY
GLUCOSE-CAPILLARY: 154 mg/dL — AB (ref 65–99)
GLUCOSE-CAPILLARY: 205 mg/dL — AB (ref 65–99)
Glucose-Capillary: 174 mg/dL — ABNORMAL HIGH (ref 65–99)
Glucose-Capillary: 172 mg/dL — ABNORMAL HIGH (ref 65–99)
Glucose-Capillary: 161 mg/dL — ABNORMAL HIGH (ref 65–99)

## 2015-03-30 MED ORDER — SIMETHICONE 80 MG PO CHEW
80.0000 mg | CHEWABLE_TABLET | Freq: Four times a day (QID) | ORAL | Status: DC | PRN
  Administered 2015-03-30 – 2015-04-03 (×4): 80 mg via ORAL
  Filled 2015-03-30 (×6): qty 1

## 2015-03-30 NOTE — Progress Notes (Signed)
[facility] PROGRESS NOTE  Duff Pozzi YTK:[id number] DOB: 1956/02/20 DOA: 03/26/2015 PCP: Charlsie Merles, MD  Brief narrative 59 year old male with uncontrolled type 2 diabetes mellitus and chronic right lower extremity diabetic ulcer with recent hospitalization from 6/21-03/24/2015 with left lower leg cellulitis which was treated with postoperative antibiotics and discharged on Zyvox and Augmentin was recently admitted to the hospital on 03/29/15 with persistent cellulitis of left leg and worsening of his right leg wounds.  Assessment/Plan: Complicated cellulitis of left lower extremity Pt has been continued on daptomycin and Primaxin since admission per ID. ID continues to follow Pain control with when necessary Percocet and dilaudid. Blood cultures so far negative. Wound culture without growth thus far. Would cont to check CPK weekly while on daptomycin. LE edema much improved  Uncontrolled type 2 diabetes mellitus with peripheral vascular disease Blood glucose had been stable on 70/30 insulin (21 units bid)  Continue with sliding scale insulin  Essential hypertension Continue lisinopril and hydralazine BP stable  Microcytic anemia Iron panel suggests iron deficiency. On ferrous sulfate  Depression continue remeron.  Code Status: Full Family Communication: Pt in room Disposition Plan: Pending   Consultants:  Orthopedic Surgery  WOC  ID  Procedures:    Antibiotics:  IV daptomycin and Primaxin 7/3>>>  HPI/Subjective: Reports feeling better. No complaints.  Objective: Filed Vitals:   03/29/15 0608 03/29/15 1500 03/29/15 2246 03/30/15 0534  BP: 142/96 139/81 169/82 126/73  Pulse: 89 77 89 99  Temp: 98.7 F (37.1 C) 97.1 F (36.2 C) 98.4 F (36.9 C) 99.8 F (37.7 C)  TempSrc: Oral Oral Oral Oral  Resp: 19 20  18   Height:      Weight:      SpO2: 100% 100% 100% 94%    Intake/Output Summary (Last 24 hours) at 03/30/15 1414 Last data filed at  03/30/15 1305  Gross per 24 hour  Intake    360 ml  Output   2151 ml  Net  -1791 ml   Filed Weights   03/26/15 0616 03/26/15 1548  Weight: 107.7 kg (237 lb 7 oz) 107.3 kg (236 lb 8.9 oz)    Exam:   General:  Awake, in nad  Cardiovascular: regular, s1, s2  Respiratory: normal resp effort, no wheezing  Abdomen: soft,nondistended, pos BS  Musculoskeletal: perfused, LE with edema that has much improved  Data Reviewed: Basic Metabolic Panel:  Recent Labs Lab 03/26/15 0141 03/27/15 0535 03/28/15 0421  NA 136 135 140  K 4.0 4.3 4.4  CL 100* 100* 103  CO2 26 27 29   GLUCOSE 152* 258* 151*  BUN 10 15 13   CREATININE 0.64 0.74 0.69  CALCIUM 8.3* 8.3* 8.5*   Liver Function Tests: No results for input(s): AST, ALT, ALKPHOS, BILITOT, PROT, ALBUMIN in the last 168 hours. No results for input(s): LIPASE, AMYLASE in the last 168 hours. No results for input(s): AMMONIA in the last 168 hours. CBC:  Recent Labs Lab 03/24/15 0502 03/26/15 0141 03/27/15 0535 03/28/15 0421  WBC 12.2* 12.3* 9.0 8.5  NEUTROABS 9.2* 9.2*  --   --   HGB 8.0* 7.8* 7.7* 7.8*  HCT 25.5* 25.1* 24.9* 25.5*  MCV 64.2* 63.4* 64.7* 64.9*  PLT 413* 431* 442* 448*   Cardiac Enzymes:  Recent Labs Lab 03/26/15 1700 03/27/15 0535  CKTOTAL 104 70   BNP (last 3 results) No results for input(s): BNP in the last 8760 hours.  ProBNP (last 3 results) No results for input(s): PROBNP in the last 8760 hours.  CBG:  Recent Labs Lab 03/29/15 1649 03/29/15 2243 03/30/15 0541 03/30/15 0733 03/30/15 1232  GLUCAP 193* 100* 154* 174* 205*    Recent Results (from the past 240 hour(s))  Culture, blood (routine x 2)     Status: None (Preliminary result)   Collection Time: 03/26/15  7:12 AM  Result Value Ref Range Status   Specimen Description BLOOD LEFT ARM  Final   Special Requests BOTTLES DRAWN AEROBIC AND ANAEROBIC 10CC  Final   Culture   Final    NO GROWTH 4 DAYS Performed at [facility]    Report Status PENDING  Incomplete  Culture, blood (routine x 2)     Status: None (Preliminary result)   Collection Time: 03/26/15  7:17 AM  Result Value Ref Range Status   Specimen Description BLOOD RIGHT ARM  Final   Special Requests BOTTLES DRAWN AEROBIC AND ANAEROBIC 10CC  Final   Culture   Final    NO GROWTH 4 DAYS Performed at [facility]    Report Status PENDING  Incomplete  Wound culture     Status: None (Preliminary result)   Collection Time: 03/28/15  2:00 PM  Result Value Ref Range Status   Specimen Description LEG LEFT  Final   Special Requests Normal  Final   Gram Stain   Final    RARE WBC PRESENT,BOTH PMN AND MONONUCLEAR NO SQUAMOUS EPITHELIAL CELLS SEEN NO ORGANISMS SEEN Performed at [employer] [facility]   Final    Culture reincubated for better growth Performed at [facility]    Report Status PENDING  Incomplete     Studies: No results found.  Scheduled Meds: . aspirin EC  81 mg Oral Daily  . atorvastatin  80 mg Oral Daily  . DAPTOmycin (CUBICIN)  IV  640 mg Intravenous Q24H  . enoxaparin  0.5 mg/kg Subcutaneous Q24H  . famotidine  20 mg Oral BID  . ferrous sulfate  325 mg Oral BID WC  . gabapentin  300 mg Oral TID  . hydrALAZINE  50 mg Oral 3 times per day  . imipenem-cilastatin  500 mg Intravenous Q6H  . insulin aspart  0-15 Units Subcutaneous TID WC  . insulin aspart  0-5 Units Subcutaneous QHS  . insulin aspart protamine- aspart  21 Units Subcutaneous BID WC  . lisinopril  10 mg Oral Daily  . meloxicam  7.5 mg Oral BID  . mirtazapine  15 mg Oral QHS  . oxybutynin  10 mg Oral QHS  . protein supplement  2 oz Oral TID  . tamsulosin  0.4 mg Oral QPC supper   Continuous Infusions:   Principal Problem:   Cellulitis of left lower extremity Active Problems:   DM (diabetes mellitus), secondary, uncontrolled, with peripheral vascular complications   Leukocytosis   Hypertension   Microcytic anemia    Cellulitis and abscess of leg    [facility], [facility] Pager [phone]. If 7PM-7AM, please contact night-coverage at [email or web address], password [facility] 03/30/2015, 2:14 PM  LOS: 4 days

## 2015-03-30 NOTE — Progress Notes (Signed)
INFECTIOUS DISEASE PROGRESS NOTE  ID: [name] is a 60 y.o. male with  Principal Problem:   Cellulitis of left lower extremity Active Problems:   DM (diabetes mellitus), secondary, uncontrolled, with peripheral vascular complications   Leukocytosis   Hypertension   Microcytic anemia   Cellulitis and abscess of leg  Subjective: C/o pain and swelling.   Abtx:  Anti-infectives    Start     Dose/Rate Route Frequency Ordered Stop   03/26/15 1200  imipenem-cilastatin (PRIMAXIN) 500 mg in sodium chloride 0.9 % 100 mL IVPB     500 mg 200 mL/hr over 30 Minutes Intravenous Every 6 hours 03/26/15 0417     03/26/15 0500  DAPTOmycin (CUBICIN) 640 mg in sodium chloride 0.9 % IVPB     640 mg 225.6 mL/hr over 30 Minutes Intravenous Every 24 hours 03/26/15 0421     03/26/15 0430  imipenem-cilastatin (PRIMAXIN) 500 mg in sodium chloride 0.9 % 100 mL IVPB     500 mg 200 mL/hr over 30 Minutes Intravenous  Once 03/26/15 0416 03/26/15 0536      Medications:  Scheduled: . aspirin EC  81 mg Oral Daily  . atorvastatin  80 mg Oral Daily  . DAPTOmycin (CUBICIN)  IV  640 mg Intravenous Q24H  . enoxaparin  0.5 mg/kg Subcutaneous Q24H  . famotidine  20 mg Oral BID  . ferrous sulfate  325 mg Oral BID WC  . gabapentin  300 mg Oral TID  . hydrALAZINE  50 mg Oral 3 times per day  . imipenem-cilastatin  500 mg Intravenous Q6H  . insulin aspart  0-15 Units Subcutaneous TID WC  . insulin aspart  0-5 Units Subcutaneous QHS  . insulin aspart protamine- aspart  21 Units Subcutaneous BID WC  . lisinopril  10 mg Oral Daily  . meloxicam  7.5 mg Oral BID  . mirtazapine  15 mg Oral QHS  . oxybutynin  10 mg Oral QHS  . protein supplement  2 oz Oral TID  . tamsulosin  0.4 mg Oral QPC supper    Objective: Vital signs in last 24 hours: Temp:  [98.3 F (36.8 C)-99.8 F (37.7 C)] 98.3 F (36.8 C) (07/07 1442) Pulse Rate:  [86-99] 86 (07/07 1442) Resp:  [18] 18 (07/07 1442) BP: (125-169)/(72-82)  125/72 mmHg (07/07 1442) SpO2:  [92 %-100 %] 92 % (07/07 1442)   General appearance: alert, cooperative and no distress Extremities: LLE with deep wound, os with purulent d/c. less than previous. tender (less than previous). macerated.   Lab Results  Recent Labs  03/28/15 0421  WBC 8.5  HGB 7.8*  HCT 25.5*  NA 140  K 4.4  CL 103  CO2 29  BUN 13  CREATININE 0.69   Liver Panel No results for input(s): PROT, ALBUMIN, AST, ALT, ALKPHOS, BILITOT, BILIDIR, IBILI in the last 72 hours. Sedimentation Rate No results for input(s): ESRSEDRATE in the last 72 hours. C-Reactive Protein No results for input(s): CRP in the last 72 hours.  Microbiology: Recent Results (from the past 240 hour(s))  Culture, blood (routine x 2)     Status: None (Preliminary result)   Collection Time: 03/26/15  7:12 AM  Result Value Ref Range Status   Specimen Description BLOOD LEFT ARM  Final   Special Requests BOTTLES DRAWN AEROBIC AND ANAEROBIC 10CC  Final   Culture   Final    NO GROWTH 4 DAYS Performed at [facility]    Report Status PENDING  Incomplete  Culture, blood (routine x 2)     Status: None (Preliminary result)   Collection Time: 03/26/15  7:17 AM  Result Value Ref Range Status   Specimen Description BLOOD RIGHT ARM  Final   Special Requests BOTTLES DRAWN AEROBIC AND ANAEROBIC 10CC  Final   Culture   Final    NO GROWTH 4 DAYS Performed at [facility]    Report Status PENDING  Incomplete  Wound culture     Status: None (Preliminary result)   Collection Time: 03/28/15  2:00 PM  Result Value Ref Range Status   Specimen Description LEG LEFT  Final   Special Requests Normal  Final   Gram Stain   Final    RARE WBC PRESENT,BOTH PMN AND MONONUCLEAR NO SQUAMOUS EPITHELIAL CELLS SEEN NO ORGANISMS SEEN Performed at [facility]   Final    Culture reincubated for better growth Performed at [facility]    Report Status PENDING  Incomplete     Studies/Results: No results found.   Assessment/Plan: DM2, poorly controlled with complications Diabetic Foot ulcer, chronic venous stasis ulcers Sepsis Cellulitis LLE ? Pyomyositis  Day 6 dapto/imipenem  No change in anbx Await Cx Check Ck weekly while on dapto  watch FSG Wound care         [name] Infectious Diseases (pager) [phone] [email or web address] 03/30/2015, 3:39 PM  LOS: 4 days

## 2015-03-30 NOTE — Care Management Note (Addendum)
Case Management Note  Patient Details  Name: [name] MRN: [id number] Date of Birth: 1956/07/27  Subjective/Objective:                   Cellulitis of LLE Action/Plan: Discharge planning  Expected Discharge Date:  03/30/15               Expected Discharge Plan:  [place]  In-House Referral:     Discharge planning Services  CM Consult  Post Acute Care Choice:    Choice offered to:     DME Arranged:    DME Agency:     HH Arranged:    [place] Agency:     Status of Service:  Completed, signed off  Medicare Important Message Given:  Yes-second notification given Date Medicare IM Given:    Medicare IM give by:    Date Additional Medicare IM Given:    Additional Medicare Important Message give by:     If discussed at [place] of Stay Meetings, dates discussed:    Additional Comments: Concurrent UR complete. CM notes pt from short term SNF [facility]) and plan is to return; plan is to DC to [place] SNF; [place] aware.  NO other CM needs were communicated. Dellie Catholic, RN 03/30/2015, 11:30 AM

## 2015-03-30 NOTE — Progress Notes (Signed)
Clinical Social Work  CSW met with patient at bedside to discuss bed offers. CSW explained that [facility] has offered but MD still needs to determine plans on antibiotics and treatment needed at DC. CSW updated SNF on DC plans. Patient reports understanding and is feeling depressed that he is still hospitalized. Patient is hopeful for legs to heal so he can begin ambulating again.  CSW will continue to follow.  [place], [place] [phone]

## 2015-03-30 NOTE — Progress Notes (Signed)
Pt seen by Dr. Tonita Cong today. No change in exam/status. Continue abx per ID, wound care tx daily for irrigation and packing.

## 2015-03-31 DIAGNOSIS — D72829 Elevated white blood cell count, unspecified: Secondary | ICD-10-CM

## 2015-03-31 LAB — CULTURE, BLOOD (ROUTINE X 2)
Culture: NO GROWTH
Culture: NO GROWTH

## 2015-03-31 LAB — BASIC METABOLIC PANEL
ANION GAP: 11 (ref 5–15)
BUN: 17 mg/dL (ref 6–20)
CALCIUM: 8.8 mg/dL — AB (ref 8.9–10.3)
CHLORIDE: 102 mmol/L (ref 101–111)
CO2: 22 mmol/L (ref 22–32)
CREATININE: 0.69 mg/dL (ref 0.61–1.24)
GFR calc Af Amer: >60 mL/min (ref >60)
GFR calc non Af Amer: >60 mL/min (ref >60)
GLUCOSE: 222 mg/dL — AB (ref 65–99)
Potassium: 4.4 mmol/L (ref 3.5–5.1)
Sodium: 135 mmol/L (ref 135–145)

## 2015-03-31 LAB — GLUCOSE, CAPILLARY
GLUCOSE-CAPILLARY: 232 mg/dL — AB (ref 65–99)
GLUCOSE-CAPILLARY: 176 mg/dL — AB (ref 65–99)
Glucose-Capillary: 208 mg/dL — ABNORMAL HIGH (ref 65–99)
Glucose-Capillary: 194 mg/dL — ABNORMAL HIGH (ref 65–99)

## 2015-03-31 LAB — WOUND CULTURE: SPECIAL REQUESTS: NORMAL

## 2015-03-31 MED ORDER — HYDROCERIN EX CREA
TOPICAL_CREAM | Freq: Two times a day (BID) | CUTANEOUS | Status: DC
  Administered 2015-03-31: 1 via TOPICAL
  Administered 2015-03-31 – 2015-04-04 (×8): via TOPICAL
  Filled 2015-03-31: qty 113

## 2015-03-31 MED ORDER — INSULIN ASPART PROT & ASPART (70-30 MIX) 100 UNIT/ML ~~LOC~~ SUSP
25.0000 [IU] | Freq: Two times a day (BID) | SUBCUTANEOUS | Status: DC
  Administered 2015-03-31 – 2015-04-04 (×10): 25 [IU] via SUBCUTANEOUS

## 2015-03-31 NOTE — Progress Notes (Signed)
ANTIBIOTIC CONSULT NOTE - Follow-Up  Pharmacy Consult for Primaxin & Cubicin Indication: Cellulitis/Sepsis  Allergies  Allergen Reactions  . Other     Pt is a Jehovah Witness. No blood products.  . Sulfa Antibiotics     Causes flu-like symptom : sweating, chills, fever, body aches    Patient Measurements: Height: 6\' 4"  (193 cm) Weight: 236 lb 8.9 oz (107.3 kg) IBW/kg (Calculated) : 86.8  Vital Signs: Temp: 98.3 F (36.8 C) (07/08 0451) Temp Source: Oral (07/08 0451) BP: 115/64 mmHg (07/08 0451) Pulse Rate: 102 (07/08 0451) Intake/Output from previous day: 07/07 0701 - 07/08 0700 In: 120 [P.O.:120] Out: 5056 [Urine:1375] Intake/Output from this shift: Total I/O In: 240 [P.O.:240] Out: 425 [Urine:425]  Labs:  Recent Labs  03/31/15 0538  CREATININE 0.69   Estimated Creatinine Clearance: 130.3 mL/min (by C-G formula based on Cr of 0.69). No results for input(s): VANCOTROUGH, VANCOPEAK, VANCORANDOM, GENTTROUGH, GENTPEAK, GENTRANDOM, TOBRATROUGH, TOBRAPEAK, TOBRARND, AMIKACINPEAK, AMIKACINTROU, AMIKACIN in the last 72 hours.   Microbiology: Recent Results (from the past 720 hour(s))  Urine culture     Status: None   Collection Time: 03/14/15  7:39 PM  Result Value Ref Range Status   Specimen Description URINE, CLEAN CATCH  Final   Special Requests NONE  Final   Culture   Final    MULTIPLE SPECIES PRESENT, SUGGEST RECOLLECTION IF CLINICALLY INDICATED Performed at [facility]    Report Status 03/16/2015 FINAL  Final  Blood culture (routine x 2)     Status: None   Collection Time: 03/14/15  7:55 PM  Result Value Ref Range Status   Specimen Description BLOOD RIGHT FOREARM  Final   Special Requests BOTTLES DRAWN AEROBIC AND ANAEROBIC 5CC  Final   Culture   Final    NO GROWTH 5 DAYS Performed at [facility]    Report Status 03/19/2015 FINAL  Final  Blood culture (routine x 2)     Status: None   Collection Time: 03/14/15  7:55 PM  Result Value Ref  Range Status   Specimen Description BLOOD RIGHT ANTECUBITAL  Final   Special Requests BOTTLES DRAWN AEROBIC AND ANAEROBIC 5ML  Final   Culture   Final    NO GROWTH 5 DAYS Performed at [facility]    Report Status 03/19/2015 FINAL  Final  MRSA PCR Screening     Status: None   Collection Time: 03/15/15  9:28 PM  Result Value Ref Range Status   MRSA by PCR NEGATIVE NEGATIVE Final    Comment:        The GeneXpert MRSA Assay (FDA approved for NASAL specimens only), is one component of a comprehensive MRSA colonization surveillance program. It is not intended to diagnose MRSA infection nor to guide or monitor treatment for MRSA infections.   Culture, blood (routine x 2)     Status: None   Collection Time: 03/16/15  7:24 PM  Result Value Ref Range Status   Specimen Description BLOOD BLOOD LEFT ARM  Final   Special Requests BOTTLES DRAWN AEROBIC AND ANAEROBIC 5ML  Final   Culture   Final    NO GROWTH 5 DAYS Performed at [facility]    Report Status 03/21/2015 FINAL  Final  Culture, blood (routine x 2)     Status: None   Collection Time: 03/16/15  8:12 PM  Result Value Ref Range Status   Specimen Description BLOOD BLOOD LEFT WRIST  Final   Special Requests BOTTLES DRAWN AEROBIC AND  ANAEROBIC 5ML  Final   Culture   Final    NO GROWTH 5 DAYS Performed at [facility]    Report Status 03/21/2015 FINAL  Final  Culture, Urine     Status: None   Collection Time: 03/17/15 11:20 AM  Result Value Ref Range Status   Specimen Description URINE, CLEAN CATCH  Final   Special Requests NONE  Final   Culture   Final    NO GROWTH 1 DAY Performed at [facility]    Report Status 03/18/2015 FINAL  Final  Culture, blood (routine x 2)     Status: None (Preliminary result)   Collection Time: 03/26/15  7:12 AM  Result Value Ref Range Status   Specimen Description BLOOD LEFT ARM  Final   Special Requests BOTTLES DRAWN AEROBIC AND ANAEROBIC 10CC  Final   Culture    Final    NO GROWTH 4 DAYS Performed at [facility]    Report Status PENDING  Incomplete  Culture, blood (routine x 2)     Status: None (Preliminary result)   Collection Time: 03/26/15  7:17 AM  Result Value Ref Range Status   Specimen Description BLOOD RIGHT ARM  Final   Special Requests BOTTLES DRAWN AEROBIC AND ANAEROBIC 10CC  Final   Culture   Final    NO GROWTH 4 DAYS Performed at [facility]    Report Status PENDING  Incomplete  Wound culture     Status: None   Collection Time: 03/28/15  2:00 PM  Result Value Ref Range Status   Specimen Description LEG LEFT  Final   Special Requests Normal  Final   Gram Stain   Final    RARE WBC PRESENT,BOTH PMN AND MONONUCLEAR NO SQUAMOUS EPITHELIAL CELLS SEEN NO ORGANISMS SEEN Performed at [facility]    Culture   Final    MULTIPLE ORGANISMS PRESENT, NONE PREDOMINANT Note: NO STAPHYLOCOCCUS AUREUS ISOLATED NO GROUP A STREP (S.PYOGENES) ISOLATED Performed at [facility]    Report Status 03/31/2015 FINAL  Final    Medical History: Past Medical History  Diagnosis Date  . Diabetes mellitus without complication   . Hypertension   . Chronic hip pain   . GERD (gastroesophageal reflux disease)   . Depression     Assessment: 59 y/o M with recent hospitaliation for sepsis with left lower extremity cellulitis.  Patient was placed on broad spectrum antibiotics and eventually discharged on Augmentin and Zyvox per ID recs.  MRI done during that hospitalization was negative for osteomyelitis or abscess.  Since discharge on 7/1, patient noted to have fever, pain/drainage from LLE and therefore presented back to [facility] ED 7/3.  Per ID consult, Primaxin and Daptomycin recommended on this admission.  Pharmacy consulted to assist with dosing of antibiotics.    7/3 >> Daptomycin >> 7/3 >>Primaxin >>   WBC: improved to WNL (7/5)  Tmax: 99.69F  SCr WNL, stable at 0.69 (7/5)  CK: 104 (7/3), 70 (7/4)   Culture  data: 7/3 blood x 2: NGTD  7/5 left leg wound: multiple organisms with none predominant, no staph aureus no group A strep isolated  Goal of Therapy:  Appropriate antibiotic dosing for renal function and indication Eradication of infection  Plan:  Continue Primaxin 500mg  IV q6h. Continue Daptomycin 640mg  (6 mg/kg) IV q24h. Follow weekly CK while on Daptomycin and statin therapy. Monitor renal function, cultures, clinical course.  F/u ID recommendations.   Hershal Coria, PharmD, BCPS Pager: [phone]  03/31/2015 12:36 PM

## 2015-03-31 NOTE — Progress Notes (Signed)
Clinical Social Work  Per MD, patient is not medically stable to DC. MD continues to work on medical plans for DC re: wound care and antibiotics. CSW will continue to follow and will assist with finding [facility] once DC plan is more clear.  [place],  [phone]

## 2015-03-31 NOTE — Progress Notes (Signed)
Physical Therapy Treatment Patient Details Name: [name] MRN: [id number] DOB: Feb 07, 1956 Today's Date: 03/31/2015    History of Present Illness 59 yo male admitted with L LE cellulitis. Recent d/c 03/24/15 to SNF. Hx of DM, chronic LE ulcers, degenerative hip.     PT Comments    Pt progressing slowly with mobility due to L LE pain level.  Pt was only able to tolerate partially sitting EOB x 2 min.  Great difficulty with all bed mobility.  Would benefit from use of an over bed trapeze. Increased time needed to return to bed and position to comfort.  Unable to attempt any OOB activity.    Follow Up Recommendations  SNF     Equipment Recommendations       Recommendations for Other Services       Precautions / Restrictions Precautions Precautions: Fall Precaution Comments: L LE Chronic Venous Stasis Ulcer (draining) and Limited L hip flexion  Restrictions Weight Bearing Restrictions: No    Mobility  Bed Mobility Overal bed mobility: Needs Assistance Bed Mobility: Rolling;Supine to Sit;Sidelying to Sit;Sit to Supine;Sit to Sidelying Rolling: Mod assist;Max assist Sidelying to sit: Mod assist;Max assist Supine to sit: Mod assist;Max assist Sit to supine: Mod assist;Max assist Sit to sidelying: Mod assist;Max assist General bed mobility comments: great difficulty with all bed mobility due to pain and limited L hip flexion.  L hip appears fixed at around 15degrees flexion only.  Pt would benefit from the use of an overbed trapeze.  Pt was onlt able to sit partially on EOB x 2 min.  10/10 L calf pain and limited L hip flexion inhibited any attempt for OOB activity.    Transfers                 General transfer comment: unable to attempt  Ambulation/Gait                 Stairs            Wheelchair Mobility    Modified Rankin (Stroke Patients Only)       Balance                                    Cognition Arousal/Alertness:  Awake/alert Behavior During Therapy: WFL for tasks assessed/performed Overall Cognitive Status: Within Functional Limits for tasks assessed                      Exercises      General Comments        Pertinent Vitals/Pain Pain Assessment: 0-10 Pain Score: 10-Worst pain ever Pain Location: L LE calf Pain Descriptors / Indicators: Burning;Constant Pain Intervention(s): Monitored during session;Premedicated before session;Repositioned    Home Living                      Prior Function            PT Goals (current goals can now be found in the care plan section) Progress towards PT goals: Progressing toward goals    Frequency  Min 2X/week    PT Plan      Co-evaluation             End of Session   Activity Tolerance: Patient limited by pain;Treatment limited secondary to medical complications (Comment);Patient limited by fatigue Patient left: in bed;with call bell/phone within reach     Time: 6967-8938  PT Time Calculation (min) (ACUTE ONLY): 25 min  Charges:  $Therapeutic Activity: 23-37 mins                    G Codes:      Rica Koyanagi  PTA [facility]  Acute  Rehab Pager      [phone]

## 2015-03-31 NOTE — Progress Notes (Signed)
[facility] PROGRESS NOTE  Ivie Maese HQP:[id number] DOB: 08-29-1956 DOA: 03/26/2015 PCP: Charlsie Merles, MD  Brief narrative 59 year old male with uncontrolled type 2 diabetes mellitus and chronic right lower extremity diabetic ulcer with recent hospitalization from 6/21-03/24/2015 with left lower leg cellulitis which was treated with postoperative antibiotics and discharged on Zyvox and Augmentin was recently admitted to the hospital on 03/29/15 with persistent cellulitis of left leg and worsening of his right leg wounds.  Assessment/Plan: Complicated cellulitis of left lower extremity Pt has been continued on daptomycin and Primaxin since admission per ID. ID continues to follow with recs to continue current abx Pain control with PRN Percocet and dilaudid. Pan-cultures so far negative. Wound culture without growth thus far. Would cont to check CPK weekly while on daptomycin. LE edema much improved since admit  Uncontrolled type 2 diabetes mellitus with peripheral vascular disease Blood glucose had been stable on 70/30 insulin but suboptimal. Have increased dose from 21 units to 25 units Continue with sliding scale insulin  Essential hypertension Continue lisinopril and hydralazine BP stable and controlled  Microcytic anemia Iron panel suggests iron deficiency. On ferrous sulfate  Depression continue remeron.  Code Status: Full Family Communication: Pt in room Disposition Plan: Pending   Consultants:  Orthopedic Surgery  WOC  ID  Procedures:    Antibiotics:  IV daptomycin and Primaxin 7/3>>>  HPI/Subjective: Complains of the hospital food. Otherwise, eager to have continued physical therapy  Objective: Filed Vitals:   03/30/15 1442 03/30/15 2154 03/31/15 0451 03/31/15 1443  BP: 125/72 126/66 115/64 116/55  Pulse: 86 100 102 96  Temp: 98.3 F (36.8 C) 98.9 F (37.2 C) 98.3 F (36.8 C) 98.2 F (36.8 C)  TempSrc: Oral Oral Oral Oral  Resp: 18  18 20 20   Height:      Weight:      SpO2: 92% 96% 94% 94%    Intake/Output Summary (Last 24 hours) at 03/31/15 1657 Last data filed at 03/31/15 1221  Gross per 24 hour  Intake    240 ml  Output    575 ml  Net   -335 ml   Filed Weights   03/26/15 0616 03/26/15 1548  Weight: 107.7 kg (237 lb 7 oz) 107.3 kg (236 lb 8.9 oz)    Exam:   General:  Awake, in nad, laying in bed  Cardiovascular: regular, s1, s2  Respiratory: normal resp effort, no wheezing  Abdomen: soft,nondistended, pos BS  Musculoskeletal: perfused, LE with edema that has much improved, no cyanosis  Data Reviewed: Basic Metabolic Panel:  Recent Labs Lab 03/26/15 0141 03/27/15 0535 03/28/15 0421 03/31/15 0538  NA 136 135 140 135  K 4.0 4.3 4.4 4.4  CL 100* 100* 103 102  CO2 26 27 29 22   GLUCOSE 152* 258* 151* 222*  BUN 10 15 13 17   CREATININE 0.64 0.74 0.69 0.69  CALCIUM 8.3* 8.3* 8.5* 8.8*   Liver Function Tests: No results for input(s): AST, ALT, ALKPHOS, BILITOT, PROT, ALBUMIN in the last 168 hours. No results for input(s): LIPASE, AMYLASE in the last 168 hours. No results for input(s): AMMONIA in the last 168 hours. CBC:  Recent Labs Lab 03/26/15 0141 03/27/15 0535 03/28/15 0421  WBC 12.3* 9.0 8.5  NEUTROABS 9.2*  --   --   HGB 7.8* 7.7* 7.8*  HCT 25.1* 24.9* 25.5*  MCV 63.4* 64.7* 64.9*  PLT 431* 442* 448*   Cardiac Enzymes:  Recent Labs Lab 03/26/15 1700 03/27/15 0535  CKTOTAL 104 70  BNP (last 3 results) No results for input(s): BNP in the last 8760 hours.  ProBNP (last 3 results) No results for input(s): PROBNP in the last 8760 hours.  CBG:  Recent Labs Lab 03/30/15 1232 03/30/15 1727 03/30/15 2156 03/31/15 0735 03/31/15 1156  GLUCAP 205* 172* 161* 208* 232*    Recent Results (from the past 240 hour(s))  Culture, blood (routine x 2)     Status: None   Collection Time: 03/26/15  7:12 AM  Result Value Ref Range Status   Specimen Description BLOOD LEFT ARM   Final   Special Requests BOTTLES DRAWN AEROBIC AND ANAEROBIC 10CC  Final   Culture   Final    NO GROWTH 5 DAYS Performed at [facility]    Report Status 03/31/2015 FINAL  Final  Culture, blood (routine x 2)     Status: None   Collection Time: 03/26/15  7:17 AM  Result Value Ref Range Status   Specimen Description BLOOD RIGHT ARM  Final   Special Requests BOTTLES DRAWN AEROBIC AND ANAEROBIC 10CC  Final   Culture   Final    NO GROWTH 5 DAYS Performed at [facility]    Report Status 03/31/2015 FINAL  Final  Wound culture     Status: None   Collection Time: 03/28/15  2:00 PM  Result Value Ref Range Status   Specimen Description LEG LEFT  Final   Special Requests Normal  Final   Gram Stain   Final    RARE WBC PRESENT,BOTH PMN AND MONONUCLEAR NO SQUAMOUS EPITHELIAL CELLS SEEN NO ORGANISMS SEEN Performed at [facility]    Culture   Final    MULTIPLE ORGANISMS PRESENT, NONE PREDOMINANT Note: NO STAPHYLOCOCCUS AUREUS ISOLATED NO GROUP A STREP (S.PYOGENES) ISOLATED Performed at [facility]    Report Status 03/31/2015 FINAL  Final     Studies: No results found.  Scheduled Meds: . aspirin EC  81 mg Oral Daily  . atorvastatin  80 mg Oral Daily  . DAPTOmycin (CUBICIN)  IV  640 mg Intravenous Q24H  . enoxaparin  0.5 mg/kg Subcutaneous Q24H  . famotidine  20 mg Oral BID  . ferrous sulfate  325 mg Oral BID WC  . gabapentin  300 mg Oral TID  . hydrALAZINE  50 mg Oral 3 times per day  . hydrocerin   Topical BID  . imipenem-cilastatin  500 mg Intravenous Q6H  . insulin aspart  0-15 Units Subcutaneous TID WC  . insulin aspart  0-5 Units Subcutaneous QHS  . insulin aspart protamine- aspart  25 Units Subcutaneous BID WC  . lisinopril  10 mg Oral Daily  . meloxicam  7.5 mg Oral BID  . mirtazapine  15 mg Oral QHS  . oxybutynin  10 mg Oral QHS  . protein supplement  2 oz Oral TID  . tamsulosin  0.4 mg Oral QPC supper   Continuous Infusions:    Principal Problem:   Cellulitis of left lower extremity Active Problems:   DM (diabetes mellitus), secondary, uncontrolled, with peripheral vascular complications   Leukocytosis   Hypertension   Microcytic anemia   Cellulitis and abscess of leg    Syndi Pua, [place] [facility] Pager [phone]. If 7PM-7AM, please contact night-coverage at [email or web address], password [facility] 03/31/2015, 4:57 PM  LOS: 5 days

## 2015-04-01 LAB — BASIC METABOLIC PANEL
Anion gap: 7 (ref 5–15)
BUN: 23 mg/dL — ABNORMAL HIGH (ref 6–20)
CO2: 27 mmol/L (ref 22–32)
Calcium: 8.7 mg/dL — ABNORMAL LOW (ref 8.9–10.3)
Chloride: 101 mmol/L (ref 101–111)
Creatinine, Ser: 0.73 mg/dL (ref 0.61–1.24)
GFR calc Af Amer: >60 mL/min (ref >60)
GLUCOSE: 166 mg/dL — AB (ref 65–99)
POTASSIUM: 4.2 mmol/L (ref 3.5–5.1)
Sodium: 135 mmol/L (ref 135–145)

## 2015-04-01 LAB — GLUCOSE, CAPILLARY
GLUCOSE-CAPILLARY: 176 mg/dL — AB (ref 65–99)
Glucose-Capillary: 153 mg/dL — ABNORMAL HIGH (ref 65–99)
Glucose-Capillary: 193 mg/dL — ABNORMAL HIGH (ref 65–99)

## 2015-04-01 LAB — CBC
HEMATOCRIT: 29.4 % — AB (ref 39.0–52.0)
Hemoglobin: 8.8 g/dL — ABNORMAL LOW (ref 13.0–17.0)
MCH: 19.5 pg — ABNORMAL LOW (ref 26.0–34.0)
MCHC: 29.9 g/dL — AB (ref 30.0–36.0)
MCV: 65.2 fL — ABNORMAL LOW (ref 78.0–100.0)
Platelets: 559 10*3/uL — ABNORMAL HIGH (ref 150–400)
RBC: 4.51 MIL/uL (ref 4.22–5.81)
RDW: 17.3 % — ABNORMAL HIGH (ref 11.5–15.5)
WBC: 7.7 10*3/uL (ref 4.0–10.5)

## 2015-04-01 NOTE — Consult Note (Signed)
WOC wound follow up Received consult to assist with wound care orders for patients LEs.  Patient is currently managed by orthopedics, who saw patient today, 04/01/15. Order clarification (if needed) would be by orthopedic team. No role at this time for [facility] Nurse, as we will defer to the orthopedic team. [place] nursing team will not follow.   Please re-consult if needed. Thanks, Maudie Flakes, MSN, RN, [place], [place], [place] [phone])

## 2015-04-01 NOTE — Progress Notes (Signed)
[facility] PROGRESS NOTE  Natividad Halls LHT:[id number] DOB: Dec 26, 1955 DOA: 03/26/2015 PCP: Charlsie Merles, MD  Brief narrative 59 year old male with uncontrolled type 2 diabetes mellitus and chronic right lower extremity diabetic ulcer with recent hospitalization from 6/21-03/24/2015 with left lower leg cellulitis which was treated with postoperative antibiotics and discharged on Zyvox and Augmentin was recently admitted to the hospital on 03/29/15 with persistent cellulitis of left leg and worsening of his right leg wounds.  Assessment/Plan: Complicated cellulitis of left lower extremity Pt has been continued on daptomycin and Primaxin since admission per ID. ID continues to follow with recs to continue current abx Ortho evaluated as well Pain control with PRN Percocet and dilaudid. Pan-cultures so far negative. Wound culture without growth thus far. Would cont to check CPK weekly while on daptomycin. LE edema much improved since admit  Uncontrolled type 2 diabetes mellitus with peripheral vascular disease Blood glucose had been stable on 70/30 insulin but suboptimal. Have increased dose from 21 units to 25 units Continue with sliding scale insulin  Essential hypertension Continue lisinopril and hydralazine BP stable and controlled  Microcytic anemia Iron panel suggests iron deficiency. On ferrous sulfate  Depression continue remeron.  Code Status: Full Family Communication: Pt in room Disposition Plan: Pending   Consultants:  Orthopedic Surgery  WOC  ID  Procedures:    Antibiotics:  IV daptomycin and Primaxin 7/3>>>  HPI/Subjective: - no chest pain, shortness of breath, no abdominal pain, nausea or vomiting.    Objective: Filed Vitals:   03/31/15 0451 03/31/15 1443 03/31/15 2124 04/01/15 0617  BP: 115/64 116/55 129/73 116/62  Pulse: 102 96 94 94  Temp: 98.3 F (36.8 C) 98.2 F (36.8 C) 98.5 F (36.9 C) 97.9 F (36.6 C)  TempSrc: Oral Oral  Oral Oral  Resp: 20 20 18 18   Height:      Weight:      SpO2: 94% 94% 98% 99%    Intake/Output Summary (Last 24 hours) at 04/01/15 0752 Last data filed at 04/01/15 0617  Gross per 24 hour  Intake    480 ml  Output   1025 ml  Net   -545 ml   Filed Weights   03/26/15 0616 03/26/15 1548  Weight: 107.7 kg (237 lb 7 oz) 107.3 kg (236 lb 8.9 oz)    Exam:   General:  Awake, in nad, laying in bed  Cardiovascular: regular, s1, s2  Respiratory: normal resp effort, no wheezing  Abdomen: soft,nondistended, pos BS  Musculoskeletal: perfused, LE with edema that has much improved, no cyanosis  Data Reviewed: Basic Metabolic Panel:  Recent Labs Lab 03/26/15 0141 03/27/15 0535 03/28/15 0421 03/31/15 0538 04/01/15 0536  NA 136 135 140 135 135  K 4.0 4.3 4.4 4.4 4.2  CL 100* 100* 103 102 101  CO2 26 27 29 22 27   GLUCOSE 152* 258* 151* 222* 166*  BUN 10 15 13 17  23*  CREATININE 0.64 0.74 0.69 0.69 0.73  CALCIUM 8.3* 8.3* 8.5* 8.8* 8.7*   Liver Function Tests: No results for input(s): AST, ALT, ALKPHOS, BILITOT, PROT, ALBUMIN in the last 168 hours. No results for input(s): LIPASE, AMYLASE in the last 168 hours. No results for input(s): AMMONIA in the last 168 hours. CBC:  Recent Labs Lab 03/26/15 0141 03/27/15 0535 03/28/15 0421 04/01/15 0536  WBC 12.3* 9.0 8.5 7.7  NEUTROABS 9.2*  --   --   --   HGB 7.8* 7.7* 7.8* 8.8*  HCT 25.1* 24.9* 25.5* 29.4*  MCV  63.4* 64.7* 64.9* 65.2*  PLT 431* 442* 448* 559*   Cardiac Enzymes:  Recent Labs Lab 03/26/15 1700 03/27/15 0535  CKTOTAL 104 70   BNP (last 3 results) No results for input(s): BNP in the last 8760 hours.  ProBNP (last 3 results) No results for input(s): PROBNP in the last 8760 hours.  CBG:  Recent Labs Lab 03/30/15 2156 03/31/15 0735 03/31/15 1156 03/31/15 1710 03/31/15 2122  GLUCAP 161* 208* 232* 176* 194*    Recent Results (from the past 240 hour(s))  Culture, blood (routine x 2)      Status: None   Collection Time: 03/26/15  7:12 AM  Result Value Ref Range Status   Specimen Description BLOOD LEFT ARM  Final   Special Requests BOTTLES DRAWN AEROBIC AND ANAEROBIC 10CC  Final   Culture   Final    NO GROWTH 5 DAYS Performed at [facility]    Report Status 03/31/2015 FINAL  Final  Culture, blood (routine x 2)     Status: None   Collection Time: 03/26/15  7:17 AM  Result Value Ref Range Status   Specimen Description BLOOD RIGHT ARM  Final   Special Requests BOTTLES DRAWN AEROBIC AND ANAEROBIC 10CC  Final   Culture   Final    NO GROWTH 5 DAYS Performed at [facility]    Report Status 03/31/2015 FINAL  Final  Wound culture     Status: None   Collection Time: 03/28/15  2:00 PM  Result Value Ref Range Status   Specimen Description LEG LEFT  Final   Special Requests Normal  Final   Gram Stain   Final    RARE WBC PRESENT,BOTH PMN AND MONONUCLEAR NO SQUAMOUS EPITHELIAL CELLS SEEN NO ORGANISMS SEEN Performed at [facility]    Culture   Final    MULTIPLE ORGANISMS PRESENT, NONE PREDOMINANT Note: NO STAPHYLOCOCCUS AUREUS ISOLATED NO GROUP A STREP (S.PYOGENES) ISOLATED Performed at [facility]    Report Status 03/31/2015 FINAL  Final     Studies: No results found.  Scheduled Meds: . aspirin EC  81 mg Oral Daily  . atorvastatin  80 mg Oral Daily  . DAPTOmycin (CUBICIN)  IV  640 mg Intravenous Q24H  . enoxaparin  0.5 mg/kg Subcutaneous Q24H  . famotidine  20 mg Oral BID  . ferrous sulfate  325 mg Oral BID WC  . gabapentin  300 mg Oral TID  . hydrALAZINE  50 mg Oral 3 times per day  . hydrocerin   Topical BID  . imipenem-cilastatin  500 mg Intravenous Q6H  . insulin aspart  0-15 Units Subcutaneous TID WC  . insulin aspart  0-5 Units Subcutaneous QHS  . insulin aspart protamine- aspart  25 Units Subcutaneous BID WC  . lisinopril  10 mg Oral Daily  . meloxicam  7.5 mg Oral BID  . mirtazapine  15 mg Oral QHS  . oxybutynin  10  mg Oral QHS  . protein supplement  2 oz Oral TID  . tamsulosin  0.4 mg Oral QPC supper   Continuous Infusions:   Principal Problem:   Cellulitis of left lower extremity Active Problems:   DM (diabetes mellitus), secondary, uncontrolled, with peripheral vascular complications   Leukocytosis   Hypertension   Microcytic anemia   Cellulitis and abscess of leg    Marzetta Board  [facility] Pager [phone]. If 7PM-7AM, please contact night-coverage at [email or web address], password [facility] 04/01/2015, 7:52 AM  LOS: 6 days

## 2015-04-01 NOTE — Progress Notes (Signed)
Subjective: Mild to pain to LLE. Reports he is improving. Toleratign PO's well. Denies SOB, CP, of calf pain.  Objective: Vital signs in last 24 hours: Temp:  [97.9 F (36.6 C)-98.5 F (36.9 C)] 97.9 F (36.6 C) (07/09 0617) Pulse Rate:  [94-96] 94 (07/09 0617) Resp:  [18-20] 18 (07/09 0617) BP: (116-129)/(55-73) 116/62 mmHg (07/09 0617) SpO2:  [94 %-99 %] 99 % (07/09 0617)  Intake/Output from previous day: 07/08 0701 - 07/09 0700 In: 480 [P.O.:480] Out: 1025 [Urine:1025] Intake/Output this shift:     Recent Labs  04/01/15 0536  HGB 8.8*    Recent Labs  04/01/15 0536  WBC 7.7  RBC 4.51  HCT 29.4*  PLT 559*    Recent Labs  03/31/15 0538 04/01/15 0536  NA 135 135  K 4.4 4.2  CL 102 101  CO2 22 27  BUN 17 23*  CREATININE 0.69 0.73  GLUCOSE 222* 166*  CALCIUM 8.8* 8.7*   No results for input(s): LABPT, INR in the last 72 hours.  Alert and oriented x3. RRR, Lungs clear, BS x4. Left Calf soft and non tender. [name] leg dressing C/D/I. No DVT signs. No signs of infection or compartment syndrome. LLE grossly neurovascularly intact. Edema to calf. Dressing to RLE as well C/D/I.   Assessment/Plan: LLE Cellulitis: Continue I& D at the bedside with daily dressing changes Continues to improve ID following continue ABX. Continue current care   [name], [name] 04/01/2015, 9:24 AM

## 2015-04-02 LAB — BASIC METABOLIC PANEL
ANION GAP: 7 (ref 5–15)
BUN: 23 mg/dL — ABNORMAL HIGH (ref 6–20)
CHLORIDE: 104 mmol/L (ref 101–111)
CO2: 27 mmol/L (ref 22–32)
Calcium: 8.8 mg/dL — ABNORMAL LOW (ref 8.9–10.3)
Creatinine, Ser: 0.7 mg/dL (ref 0.61–1.24)
GFR calc Af Amer: >60 mL/min (ref >60)
GFR calc non Af Amer: >60 mL/min (ref >60)
Glucose, Bld: 116 mg/dL — ABNORMAL HIGH (ref 65–99)
POTASSIUM: 4.3 mmol/L (ref 3.5–5.1)
SODIUM: 138 mmol/L (ref 135–145)

## 2015-04-02 LAB — GLUCOSE, CAPILLARY
GLUCOSE-CAPILLARY: 145 mg/dL — AB (ref 65–99)
Glucose-Capillary: 176 mg/dL — ABNORMAL HIGH (ref 65–99)
Glucose-Capillary: 153 mg/dL — ABNORMAL HIGH (ref 65–99)
Glucose-Capillary: 271 mg/dL — ABNORMAL HIGH (ref 65–99)
Glucose-Capillary: 190 mg/dL — ABNORMAL HIGH (ref 65–99)

## 2015-04-02 MED ORDER — SACCHAROMYCES BOULARDII 250 MG PO CAPS
250.0000 mg | ORAL_CAPSULE | Freq: Two times a day (BID) | ORAL | Status: DC
  Administered 2015-04-02 – 2015-04-03 (×3): 250 mg via ORAL
  Filled 2015-04-02 (×4): qty 1

## 2015-04-02 NOTE — Progress Notes (Signed)
[facility] PROGRESS NOTE  [name] QMG:[id number] DOB: May 17, 1956 DOA: 03/26/2015 PCP: Charlsie Merles, MD  Brief narrative 59 year old male with uncontrolled type 2 diabetes mellitus and chronic right lower extremity diabetic ulcer with recent hospitalization from 6/21-03/24/2015 with left lower leg cellulitis which was treated with postoperative antibiotics and discharged on Zyvox and Augmentin was recently admitted to the hospital on 03/29/15 with persistent cellulitis of left leg and worsening of his right leg wounds.  Assessment/Plan: Complicated cellulitis of left lower extremity Pt has been continued on daptomycin and Primaxin since admission per ID. ID is following with latest recs to continue current abx Ortho is following Pain control with PRN Percocet and dilaudid. Pan-cultures so far remain negative. Wound culture without growth Would cont to check CPK weekly while on daptomycin. LE edema appears much improved since admit  Uncontrolled type 2 diabetes mellitus with peripheral vascular disease Blood glucose overall stable on 70/30 insulin Dose was increased to 25 units Continue with sliding scale insulin Glucose gradually improving  Essential hypertension Continue lisinopril and hydralazine BP stable and controlled  Microcytic anemia Iron panel suggests iron deficiency. On ferrous sulfate  Depression continue remeron.  Code Status: Full Family Communication: Pt in room Disposition Plan: Pending   Consultants:  Orthopedic Surgery  WOC  ID  Procedures:    Antibiotics:  IV daptomycin and Primaxin 7/3>>>  HPI/Subjective: Still complains of LE pain on palpation  Objective: Filed Vitals:   04/01/15 1500 04/01/15 2109 04/02/15 0454 04/02/15 1500  BP: 139/81 136/70 123/69 131/69  Pulse: 87 87 81 83  Temp: 97.8 F (36.6 C) 98 F (36.7 C) 98.1 F (36.7 C) 99.4 F (37.4 C)  TempSrc: Oral Oral Oral Oral  Resp: 18 18 18 18   Height:       Weight:      SpO2: 100% 100% 99% 98%    Intake/Output Summary (Last 24 hours) at 04/02/15 1630 Last data filed at 04/02/15 1145  Gross per 24 hour  Intake    480 ml  Output   1151 ml  Net   -671 ml   Filed Weights   03/26/15 0616 03/26/15 1548  Weight: 107.7 kg (237 lb 7 oz) 107.3 kg (236 lb 8.9 oz)    Exam:   General:  Awake, in nad, laying in bed  Cardiovascular: regular, s1, s2  Respiratory: normal resp effort, no wheezing  Abdomen: soft,nondistended, pos BS  Musculoskeletal: perfused, LE with edema that has improving, no cyanosis  Data Reviewed: Basic Metabolic Panel:  Recent Labs Lab 03/27/15 0535 03/28/15 0421 03/31/15 0538 04/01/15 0536 04/02/15 0544  NA 135 140 135 135 138  K 4.3 4.4 4.4 4.2 4.3  CL 100* 103 102 101 104  CO2 27 29 22 27 27   GLUCOSE 258* 151* 222* 166* 116*  BUN 15 13 17  23* 23*  CREATININE 0.74 0.69 0.69 0.73 0.70  CALCIUM 8.3* 8.5* 8.8* 8.7* 8.8*   Liver Function Tests: No results for input(s): AST, ALT, ALKPHOS, BILITOT, PROT, ALBUMIN in the last 168 hours. No results for input(s): LIPASE, AMYLASE in the last 168 hours. No results for input(s): AMMONIA in the last 168 hours. CBC:  Recent Labs Lab 03/27/15 0535 03/28/15 0421 04/01/15 0536  WBC 9.0 8.5 7.7  HGB 7.7* 7.8* 8.8*  HCT 24.9* 25.5* 29.4*  MCV 64.7* 64.9* 65.2*  PLT 442* 448* 559*   Cardiac Enzymes:  Recent Labs Lab 03/26/15 1700 03/27/15 0535  CKTOTAL 104 70   BNP (last 3 results)  No results for input(s): BNP in the last 8760 hours.  ProBNP (last 3 results) No results for input(s): PROBNP in the last 8760 hours.  CBG:  Recent Labs Lab 04/01/15 1255 04/01/15 1627 04/01/15 2105 04/02/15 0746 04/02/15 1141  GLUCAP 193* 176* 145* 176* 153*    Recent Results (from the past 240 hour(s))  Culture, blood (routine x 2)     Status: None   Collection Time: 03/26/15  7:12 AM  Result Value Ref Range Status   Specimen Description BLOOD LEFT ARM   Final   Special Requests BOTTLES DRAWN AEROBIC AND ANAEROBIC 10CC  Final   Culture   Final    NO GROWTH 5 DAYS Performed at [facility]    Report Status 03/31/2015 FINAL  Final  Culture, blood (routine x 2)     Status: None   Collection Time: 03/26/15  7:17 AM  Result Value Ref Range Status   Specimen Description BLOOD RIGHT ARM  Final   Special Requests BOTTLES DRAWN AEROBIC AND ANAEROBIC 10CC  Final   Culture   Final    NO GROWTH 5 DAYS Performed at [facility]    Report Status 03/31/2015 FINAL  Final  Wound culture     Status: None   Collection Time: 03/28/15  2:00 PM  Result Value Ref Range Status   Specimen Description LEG LEFT  Final   Special Requests Normal  Final   Gram Stain   Final    RARE WBC PRESENT,BOTH PMN AND MONONUCLEAR NO SQUAMOUS EPITHELIAL CELLS SEEN NO ORGANISMS SEEN Performed at [facility]    Culture   Final    MULTIPLE ORGANISMS PRESENT, NONE PREDOMINANT Note: NO STAPHYLOCOCCUS AUREUS ISOLATED NO GROUP A STREP (S.PYOGENES) ISOLATED Performed at [facility]    Report Status 03/31/2015 FINAL  Final     Studies: No results found.  Scheduled Meds: . aspirin EC  81 mg Oral Daily  . atorvastatin  80 mg Oral Daily  . DAPTOmycin (CUBICIN)  IV  640 mg Intravenous Q24H  . enoxaparin  0.5 mg/kg Subcutaneous Q24H  . famotidine  20 mg Oral BID  . ferrous sulfate  325 mg Oral BID WC  . gabapentin  300 mg Oral TID  . hydrALAZINE  50 mg Oral 3 times per day  . hydrocerin   Topical BID  . imipenem-cilastatin  500 mg Intravenous Q6H  . insulin aspart  0-15 Units Subcutaneous TID WC  . insulin aspart  0-5 Units Subcutaneous QHS  . insulin aspart protamine- aspart  25 Units Subcutaneous BID WC  . lisinopril  10 mg Oral Daily  . meloxicam  7.5 mg Oral BID  . mirtazapine  15 mg Oral QHS  . oxybutynin  10 mg Oral QHS  . protein supplement  2 oz Oral TID  . saccharomyces boulardii  250 mg Oral BID  . tamsulosin  0.4 mg Oral  QPC supper   Continuous Infusions:   Principal Problem:   Cellulitis of left lower extremity Active Problems:   DM (diabetes mellitus), secondary, uncontrolled, with peripheral vascular complications   Leukocytosis   Hypertension   Microcytic anemia   Cellulitis and abscess of leg    [facility], [place] [facility] Pager [phone]. If 7PM-7AM, please contact night-coverage at [email or web address], password [facility] 04/02/2015, 4:30 PM  LOS: 7 days

## 2015-04-02 NOTE — Progress Notes (Signed)
Oneal Schoenberger  MRN: [id number] DOB/Age: 06-04-1956 59 y.o. Physician: Rada Hay Procedure:       Subjective: Conversation with nursing staff, wet to dry dressing changes being performed daily by night nurse. No immediate needs today  Vital Signs Temp:  [97.8 F (36.6 C)-98.1 F (36.7 C)] 98.1 F (36.7 C) (07/10 0454) Pulse Rate:  [81-87] 81 (07/10 0454) Resp:  [18] 18 (07/10 0454) BP: (123-139)/(69-81) 123/69 mmHg (07/10 0454) SpO2:  [99 %-100 %] 99 % (07/10 0454)  Lab Results  Recent Labs  04/01/15 0536  WBC 7.7  HGB 8.8*  HCT 29.4*  PLT 559*   BMET  Recent Labs  04/01/15 0536 04/02/15 0544  NA 135 138  K 4.2 4.3  CL 101 104  CO2 27 27  GLUCOSE 166* 116*  BUN 23* 23*  CREATININE 0.73 0.70  CALCIUM 8.7* 8.8*   INR  Date Value Ref Range Status  03/15/2015 1.21 0.00 - 1.49 Final     Exam Not examined        Plan Continue IV antibiotics Dr. Tonita Cong to return tomorrow  [facility] for Dr.Kevin Supple 04/02/2015, 9:17 AM Contact # [phone]

## 2015-04-03 LAB — GLUCOSE, CAPILLARY
GLUCOSE-CAPILLARY: 153 mg/dL — AB (ref 65–99)
GLUCOSE-CAPILLARY: 177 mg/dL — AB (ref 65–99)
Glucose-Capillary: 145 mg/dL — ABNORMAL HIGH (ref 65–99)
Glucose-Capillary: 184 mg/dL — ABNORMAL HIGH (ref 65–99)
Glucose-Capillary: 195 mg/dL — ABNORMAL HIGH (ref 65–99)

## 2015-04-03 LAB — CK: CK TOTAL: 60 U/L (ref 49–397)

## 2015-04-03 MED ORDER — AMOXICILLIN-POT CLAVULANATE 875-125 MG PO TABS
1.0000 | ORAL_TABLET | Freq: Two times a day (BID) | ORAL | Status: DC
  Administered 2015-04-03 – 2015-04-04 (×2): 1 via ORAL
  Filled 2015-04-03 (×3): qty 1

## 2015-04-03 NOTE — Progress Notes (Signed)
PT Cancellation Note  Patient Details Name: [name] MRN: [id number] DOB: [birth year]   Cancelled Treatment:     Pt eating and enjoying his lunch.  Pt has not been getting OOB with nursing staff.  "most comfortable in the bed".  Will check back later as schedule permits.  Pt plans to D/C to SNF will need to do so by non emergency EMS.   Nathanial Rancher 04/03/2015, 2:38 PM

## 2015-04-03 NOTE — Progress Notes (Signed)
ANTIBIOTIC CONSULT NOTE - Follow-Up  Pharmacy Consult for Primaxin & Cubicin Indication: Cellulitis/Sepsis  Allergies  Allergen Reactions  . Other     Pt is a Jehovah Witness. No blood products.  . Sulfa Antibiotics     Causes flu-like symptom : sweating, chills, fever, body aches    Patient Measurements: Height: 6\' 4"  (193 cm) Weight: 236 lb 8.9 oz (107.3 kg) IBW/kg (Calculated) : 86.8  Vital Signs: Temp: 98 F (36.7 C) (07/11 0604) Temp Source: Oral (07/11 0604) BP: 133/77 mmHg (07/11 0604) Pulse Rate: 84 (07/11 0604) Intake/Output from previous day: 07/10 0701 - 07/11 0700 In: 820 [P.O.:720; IV Piggyback:100] Out: 1800 [Urine:1800] Intake/Output from this shift:    Labs:  Recent Labs  04/01/15 0536 04/02/15 0544  WBC 7.7  --   HGB 8.8*  --   PLT 559*  --   CREATININE 0.73 0.70   Estimated Creatinine Clearance: 130.3 mL/min (by C-G formula based on Cr of 0.7). No results for input(s): VANCOTROUGH, VANCOPEAK, VANCORANDOM, GENTTROUGH, GENTPEAK, GENTRANDOM, TOBRATROUGH, TOBRAPEAK, TOBRARND, AMIKACINPEAK, AMIKACINTROU, AMIKACIN in the last 72 hours.   Microbiology: Recent Results (from the past 720 hour(s))  Urine culture     Status: None   Collection Time: 03/14/15  7:39 PM  Result Value Ref Range Status   Specimen Description URINE, CLEAN CATCH  Final   Special Requests NONE  Final   Culture   Final    MULTIPLE SPECIES PRESENT, SUGGEST RECOLLECTION IF CLINICALLY INDICATED Performed at [facility]    Report Status 03/16/2015 FINAL  Final  Blood culture (routine x 2)     Status: None   Collection Time: 03/14/15  7:55 PM  Result Value Ref Range Status   Specimen Description BLOOD RIGHT FOREARM  Final   Special Requests BOTTLES DRAWN AEROBIC AND ANAEROBIC 5CC  Final   Culture   Final    NO GROWTH 5 DAYS Performed at [facility]    Report Status 03/19/2015 FINAL  Final  Blood culture (routine x 2)     Status: None   Collection Time:  03/14/15  7:55 PM  Result Value Ref Range Status   Specimen Description BLOOD RIGHT ANTECUBITAL  Final   Special Requests BOTTLES DRAWN AEROBIC AND ANAEROBIC 5ML  Final   Culture   Final    NO GROWTH 5 DAYS Performed at [facility]    Report Status 03/19/2015 FINAL  Final  MRSA PCR Screening     Status: None   Collection Time: 03/15/15  9:28 PM  Result Value Ref Range Status   MRSA by PCR NEGATIVE NEGATIVE Final    Comment:        The GeneXpert MRSA Assay (FDA approved for NASAL specimens only), is one component of a comprehensive MRSA colonization surveillance program. It is not intended to diagnose MRSA infection nor to guide or monitor treatment for MRSA infections.   Culture, blood (routine x 2)     Status: None   Collection Time: 03/16/15  7:24 PM  Result Value Ref Range Status   Specimen Description BLOOD BLOOD LEFT ARM  Final   Special Requests BOTTLES DRAWN AEROBIC AND ANAEROBIC 5ML  Final   Culture   Final    NO GROWTH 5 DAYS Performed at [facility]    Report Status 03/21/2015 FINAL  Final  Culture, blood (routine x 2)     Status: None   Collection Time: 03/16/15  8:12 PM  Result Value Ref Range Status  Specimen Description BLOOD BLOOD LEFT WRIST  Final   Special Requests BOTTLES DRAWN AEROBIC AND ANAEROBIC 5ML  Final   Culture   Final    NO GROWTH 5 DAYS Performed at [facility]    Report Status 03/21/2015 FINAL  Final  Culture, Urine     Status: None   Collection Time: 03/17/15 11:20 AM  Result Value Ref Range Status   Specimen Description URINE, CLEAN CATCH  Final   Special Requests NONE  Final   Culture   Final    NO GROWTH 1 DAY Performed at [facility]    Report Status 03/18/2015 FINAL  Final  Culture, blood (routine x 2)     Status: None   Collection Time: 03/26/15  7:12 AM  Result Value Ref Range Status   Specimen Description BLOOD LEFT ARM  Final   Special Requests BOTTLES DRAWN AEROBIC AND ANAEROBIC 10CC   Final   Culture   Final    NO GROWTH 5 DAYS Performed at [facility]    Report Status 03/31/2015 FINAL  Final  Culture, blood (routine x 2)     Status: None   Collection Time: 03/26/15  7:17 AM  Result Value Ref Range Status   Specimen Description BLOOD RIGHT ARM  Final   Special Requests BOTTLES DRAWN AEROBIC AND ANAEROBIC 10CC  Final   Culture   Final    NO GROWTH 5 DAYS Performed at [facility]    Report Status 03/31/2015 FINAL  Final  Wound culture     Status: None   Collection Time: 03/28/15  2:00 PM  Result Value Ref Range Status   Specimen Description LEG LEFT  Final   Special Requests Normal  Final   Gram Stain   Final    RARE WBC PRESENT,BOTH PMN AND MONONUCLEAR NO SQUAMOUS EPITHELIAL CELLS SEEN NO ORGANISMS SEEN Performed at [facility]    Culture   Final    MULTIPLE ORGANISMS PRESENT, NONE PREDOMINANT Note: NO STAPHYLOCOCCUS AUREUS ISOLATED NO GROUP A STREP (S.PYOGENES) ISOLATED Performed at [facility]    Report Status 03/31/2015 FINAL  Final    Medical History: Past Medical History  Diagnosis Date  . Diabetes mellitus without complication   . Hypertension   . Chronic hip pain   . GERD (gastroesophageal reflux disease)   . Depression     Assessment: 59 y/o M with recent hospitaliation for sepsis with left lower extremity cellulitis.  Patient was placed on broad spectrum antibiotics and eventually discharged on Augmentin and Zyvox per ID recs.  MRI done during that hospitalization was negative for osteomyelitis or abscess.  Since discharge on 7/1, patient noted to have fever, pain/drainage from LLE and therefore presented back to [facility] ED 7/3.  Per ID consult, Primaxin and Daptomycin recommended on this admission.  Pharmacy consulted to assist with dosing of antibiotics.    7/3 >> Daptomycin >> 7/3 >> Primaxin >>   WBC: improved to WNL (7/9)  Tmax: 99.12F  SCr WNL, stable at 0.7  CK: 104 (7/3), 70 (7/4), 60 (7/11)    Culture data: 7/3 blood x 2: NGF 7/5 left leg wound: multiple organisms with none predominant, no staph aureus no group A strep isolated 7/8: left leg wound: sent  Goal of Therapy:  Appropriate antibiotic dosing for renal function and indication Eradication of infection  Plan:  Day #9 antiibiotics Continue Primaxin 500mg  IV q6h. Continue Daptomycin 640mg  (6 mg/kg) IV q24h. Follow weekly CK while  on Daptomycin and statin therapy. Monitor renal function, cultures, clinical course.  F/u ID recommendations.   Lindell Spar, PharmD, BCPS Pager: [phone] 04/03/2015 7:11 AM

## 2015-04-03 NOTE — Progress Notes (Signed)
INFECTIOUS DISEASE PROGRESS NOTE  ID: [name] is a 59 y.o. male with  Principal Problem:   Cellulitis of left lower extremity Active Problems:   DM (diabetes mellitus), secondary, uncontrolled, with peripheral vascular complications   Leukocytosis   Hypertension   Microcytic anemia   Cellulitis and abscess of leg  Subjective: Feels better.   Abtx:  Anti-infectives    Start     Dose/Rate Route Frequency Ordered Stop   03/26/15 1200  imipenem-cilastatin (PRIMAXIN) 500 mg in sodium chloride 0.9 % 100 mL IVPB     500 mg 200 mL/hr over 30 Minutes Intravenous Every 6 hours 03/26/15 0417     03/26/15 0500  DAPTOmycin (CUBICIN) 640 mg in sodium chloride 0.9 % IVPB     640 mg 225.6 mL/hr over 30 Minutes Intravenous Every 24 hours 03/26/15 0421     03/26/15 0430  imipenem-cilastatin (PRIMAXIN) 500 mg in sodium chloride 0.9 % 100 mL IVPB     500 mg 200 mL/hr over 30 Minutes Intravenous  Once 03/26/15 0416 03/26/15 0536      Medications:  Scheduled: . aspirin EC  81 mg Oral Daily  . atorvastatin  80 mg Oral Daily  . DAPTOmycin (CUBICIN)  IV  640 mg Intravenous Q24H  . enoxaparin  0.5 mg/kg Subcutaneous Q24H  . famotidine  20 mg Oral BID  . ferrous sulfate  325 mg Oral BID WC  . gabapentin  300 mg Oral TID  . hydrALAZINE  50 mg Oral 3 times per day  . hydrocerin   Topical BID  . imipenem-cilastatin  500 mg Intravenous Q6H  . insulin aspart  0-15 Units Subcutaneous TID WC  . insulin aspart  0-5 Units Subcutaneous QHS  . insulin aspart protamine- aspart  25 Units Subcutaneous BID WC  . lisinopril  10 mg Oral Daily  . meloxicam  7.5 mg Oral BID  . mirtazapine  15 mg Oral QHS  . oxybutynin  10 mg Oral QHS  . protein supplement  2 oz Oral TID  . saccharomyces boulardii  250 mg Oral BID  . tamsulosin  0.4 mg Oral QPC supper    Objective: Vital signs in last 24 hours: Temp:  [98 F (36.7 C)-99.8 F (37.7 C)] 98.6 F (37 C) (07/11 1613) Pulse Rate:  [84-93] 93 (07/11  1613) Resp:  [18] 18 (07/11 1613) BP: (122-133)/(67-77) 122/67 mmHg (07/11 1613) SpO2:  [97 %-100 %] 97 % (07/11 1613)   General appearance: alert, cooperative and no distress Extremities: on anterior calf there is a swollen area, no os, no d/c. on posterior calf there are 2 os, no d/c. skin peeling off in multiple areas.   Lab Results  Recent Labs  04/01/15 0536 04/02/15 0544  WBC 7.7  --   HGB 8.8*  --   HCT 29.4*  --   NA 135 138  K 4.2 4.3  CL 101 104  CO2 27 27  BUN 23* 23*  CREATININE 0.73 0.70   Liver Panel No results for input(s): PROT, ALBUMIN, AST, ALT, ALKPHOS, BILITOT, BILIDIR, IBILI in the last 72 hours. Sedimentation Rate No results for input(s): ESRSEDRATE in the last 72 hours. C-Reactive Protein No results for input(s): CRP in the last 72 hours.  Microbiology: Recent Results (from the past 240 hour(s))  Culture, blood (routine x 2)     Status: None   Collection Time: 03/26/15  7:12 AM  Result Value Ref Range Status   Specimen Description BLOOD LEFT ARM  Final   Special Requests BOTTLES DRAWN AEROBIC AND ANAEROBIC 10CC  Final   Culture   Final    NO GROWTH 5 DAYS Performed at [facility]    Report Status 03/31/2015 FINAL  Final  Culture, blood (routine x 2)     Status: None   Collection Time: 03/26/15  7:17 AM  Result Value Ref Range Status   Specimen Description BLOOD RIGHT ARM  Final   Special Requests BOTTLES DRAWN AEROBIC AND ANAEROBIC 10CC  Final   Culture   Final    NO GROWTH 5 DAYS Performed at [facility]    Report Status 03/31/2015 FINAL  Final  Wound culture     Status: None   Collection Time: 03/28/15  2:00 PM  Result Value Ref Range Status   Specimen Description LEG LEFT  Final   Special Requests Normal  Final   Gram Stain   Final    RARE WBC PRESENT,BOTH PMN AND MONONUCLEAR NO SQUAMOUS EPITHELIAL CELLS SEEN NO ORGANISMS SEEN Performed at [facility]    Culture   Final    MULTIPLE ORGANISMS PRESENT,  NONE PREDOMINANT Note: NO STAPHYLOCOCCUS AUREUS ISOLATED NO GROUP A STREP (S.PYOGENES) ISOLATED Performed at [facility]    Report Status 03/31/2015 FINAL  Final    Studies/Results: No results found.   Assessment/Plan: DM2, poorly controlled with complications Diabetic Foot ulcer, chronic venous stasis ulcers Sepsis Cellulitis LLE ? Pyomyositis  Total days of antibiotics: 9 dapto/imipenem  Would change him to augmentin given his Cx.  D/c planning Will need prolonged wound care, wound care f/u.  Available as needed.          [name] Infectious Diseases (pager) [phone] [email or web address] 04/03/2015, 5:23 PM  LOS: 8 days

## 2015-04-03 NOTE — Progress Notes (Signed)
Clinical Social Work  Patient was discussed during progression meeting and MD awaiting to decide if LT antibiotics and PICC line needed. CSW will continue to follow to assist with transfer to SNF once medically stable.  [place], [place] [phone]

## 2015-04-03 NOTE — Progress Notes (Signed)
[facility] PROGRESS NOTE  [name] GGY:[id number] DOB: Jul 06, 1956 DOA: 03/26/2015 PCP: Charlsie Merles, MD  Brief narrative 59 year old male with uncontrolled type 2 diabetes mellitus and chronic right lower extremity diabetic ulcer with recent hospitalization from 6/21-03/24/2015 with left lower leg cellulitis which was treated with postoperative antibiotics and discharged on Zyvox and Augmentin was recently admitted to the hospital on 03/29/15 with persistent cellulitis of left leg and worsening of his right leg wounds.  Assessment/Plan: Complicated cellulitis of left lower extremity Pt remains on daptomycin and Primaxin since admission per latest ID recommendations Ortho is following Pain control with PRN Percocet and dilaudid. Pan-cultures so far remain negative. Wound culture without growth Would cont to check CPK weekly while on daptomycin - normal thus far LE edema appears much improved since admit  Uncontrolled type 2 diabetes mellitus with peripheral vascular disease Blood glucose overall stable on 70/30 insulin Dose was increased to 25 units- random AM glucose of 116, peak today 177 Continue with sliding scale insulin Cont insulin for now  Essential hypertension Continue lisinopril and hydralazine BP stable and controlled  Microcytic anemia Iron panel suggests iron deficiency. On ferrous sulfate  Depression continued remeron.  Code Status: Full Family Communication: Pt in room Disposition Plan: Pending   Consultants:  Orthopedic Surgery  WOC  ID  Procedures:    Antibiotics:  IV daptomycin and Primaxin 7/3>>>  HPI/Subjective: Pt still reports LE pain, worse in the LLE. Swelling improved however  Objective: Filed Vitals:   04/02/15 1500 04/02/15 2110 04/03/15 0604 04/03/15 1613  BP: 131/69 132/67 133/77 122/67  Pulse: 83 92 84 93  Temp: 99.4 F (37.4 C) 99.8 F (37.7 C) 98 F (36.7 C) 98.6 F (37 C)  TempSrc: Oral Oral Oral Oral   Resp: 18 18 18 18   Height:      Weight:      SpO2: 98% 98% 100% 97%    Intake/Output Summary (Last 24 hours) at 04/03/15 1729 Last data filed at 04/03/15 1614  Gross per 24 hour  Intake   1145 ml  Output   2000 ml  Net   -855 ml   Filed Weights   03/26/15 0616 03/26/15 1548  Weight: 107.7 kg (237 lb 7 oz) 107.3 kg (236 lb 8.9 oz)    Exam:   General:  Awake, in nad, laying in bed  Cardiovascular: regular, s1, s2  Respiratory: normal resp effort, no wheezing  Abdomen: soft,nondistended, pos BS  Musculoskeletal: perfused, LE with edema that has improving, no cyanosis, palpable pulses  Data Reviewed: Basic Metabolic Panel:  Recent Labs Lab 03/28/15 0421 03/31/15 0538 04/01/15 0536 04/02/15 0544  NA 140 135 135 138  K 4.4 4.4 4.2 4.3  CL 103 102 101 104  CO2 29 22 27 27   GLUCOSE 151* 222* 166* 116*  BUN 13 17 23* 23*  CREATININE 0.69 0.69 0.73 0.70  CALCIUM 8.5* 8.8* 8.7* 8.8*   Liver Function Tests: No results for input(s): AST, ALT, ALKPHOS, BILITOT, PROT, ALBUMIN in the last 168 hours. No results for input(s): LIPASE, AMYLASE in the last 168 hours. No results for input(s): AMMONIA in the last 168 hours. CBC:  Recent Labs Lab 03/28/15 0421 04/01/15 0536  WBC 8.5 7.7  HGB 7.8* 8.8*  HCT 25.5* 29.4*  MCV 64.9* 65.2*  PLT 448* 559*   Cardiac Enzymes:  Recent Labs Lab 04/03/15 0525  CKTOTAL 60   BNP (last 3 results) No results for input(s): BNP in the last 8760 hours.  ProBNP (last 3 results) No results for input(s): PROBNP in the last 8760 hours.  CBG:  Recent Labs Lab 04/02/15 1642 04/02/15 2146 04/03/15 0715 04/03/15 1240 04/03/15 1611  GLUCAP 271* 190* 153* 145* 177*    Recent Results (from the past 240 hour(s))  Culture, blood (routine x 2)     Status: None   Collection Time: 03/26/15  7:12 AM  Result Value Ref Range Status   Specimen Description BLOOD LEFT ARM  Final   Special Requests BOTTLES DRAWN AEROBIC AND ANAEROBIC  10CC  Final   Culture   Final    NO GROWTH 5 DAYS Performed at [facility]    Report Status 03/31/2015 FINAL  Final  Culture, blood (routine x 2)     Status: None   Collection Time: 03/26/15  7:17 AM  Result Value Ref Range Status   Specimen Description BLOOD RIGHT ARM  Final   Special Requests BOTTLES DRAWN AEROBIC AND ANAEROBIC 10CC  Final   Culture   Final    NO GROWTH 5 DAYS Performed at [facility]    Report Status 03/31/2015 FINAL  Final  Wound culture     Status: None   Collection Time: 03/28/15  2:00 PM  Result Value Ref Range Status   Specimen Description LEG LEFT  Final   Special Requests Normal  Final   Gram Stain   Final    RARE WBC PRESENT,BOTH PMN AND MONONUCLEAR NO SQUAMOUS EPITHELIAL CELLS SEEN NO ORGANISMS SEEN Performed at [facility]    Culture   Final    MULTIPLE ORGANISMS PRESENT, NONE PREDOMINANT Note: NO STAPHYLOCOCCUS AUREUS ISOLATED NO GROUP A STREP (S.PYOGENES) ISOLATED Performed at [facility]    Report Status 03/31/2015 FINAL  Final     Studies: No results found.  Scheduled Meds: . aspirin EC  81 mg Oral Daily  . atorvastatin  80 mg Oral Daily  . DAPTOmycin (CUBICIN)  IV  640 mg Intravenous Q24H  . enoxaparin  0.5 mg/kg Subcutaneous Q24H  . famotidine  20 mg Oral BID  . ferrous sulfate  325 mg Oral BID WC  . gabapentin  300 mg Oral TID  . hydrALAZINE  50 mg Oral 3 times per day  . hydrocerin   Topical BID  . imipenem-cilastatin  500 mg Intravenous Q6H  . insulin aspart  0-15 Units Subcutaneous TID WC  . insulin aspart  0-5 Units Subcutaneous QHS  . insulin aspart protamine- aspart  25 Units Subcutaneous BID WC  . lisinopril  10 mg Oral Daily  . meloxicam  7.5 mg Oral BID  . mirtazapine  15 mg Oral QHS  . oxybutynin  10 mg Oral QHS  . protein supplement  2 oz Oral TID  . saccharomyces boulardii  250 mg Oral BID  . tamsulosin  0.4 mg Oral QPC supper   Continuous Infusions:   Principal Problem:    Cellulitis of left lower extremity Active Problems:   DM (diabetes mellitus), secondary, uncontrolled, with peripheral vascular complications   Leukocytosis   Hypertension   Microcytic anemia   Cellulitis and abscess of leg    [facility], [place] [facility] Pager [phone]. If 7PM-7AM, please contact night-coverage at [email or web address], password [email or web address] 04/03/2015, 5:29 PM  LOS: 8 days

## 2015-04-04 LAB — GLUCOSE, CAPILLARY
GLUCOSE-CAPILLARY: 157 mg/dL — AB (ref 65–99)
GLUCOSE-CAPILLARY: 242 mg/dL — AB (ref 65–99)
Glucose-Capillary: 138 mg/dL — ABNORMAL HIGH (ref 65–99)

## 2015-04-04 MED ORDER — POLYETHYLENE GLYCOL 3350 17 G PO PACK: 17.0000 g | PACK | Freq: Every day | ORAL | Status: DC | PRN

## 2015-04-04 MED ORDER — LISINOPRIL 10 MG PO TABS: 10.0000 mg | ORAL_TABLET | Freq: Every day | ORAL | Status: DC

## 2015-04-04 MED ORDER — HYDROCERIN EX CREA: 1.0000 "application " | TOPICAL_CREAM | Freq: Two times a day (BID) | CUTANEOUS | Status: DC

## 2015-04-04 MED ORDER — INSULIN ASPART PROT & ASPART (70-30 MIX) 100 UNIT/ML ~~LOC~~ SUSP: 25.0000 [IU] | Freq: Two times a day (BID) | SUBCUTANEOUS | Status: DC

## 2015-04-04 MED ORDER — OXYCODONE-ACETAMINOPHEN 5-325 MG PO TABS: 1.0000 | ORAL_TABLET | ORAL | Status: DC | PRN

## 2015-04-04 MED ORDER — AMOXICILLIN-POT CLAVULANATE 875-125 MG PO TABS: 1.0000 | ORAL_TABLET | Freq: Two times a day (BID) | ORAL | Status: DC

## 2015-04-04 NOTE — Care Management Note (Signed)
Case Management Note  Patient Details  Name: [name] MRN: [id number] Date of Birth: Jan 16, 1956  Subjective/Objective:                    Action/Plan:d/c SNF.   Expected Discharge Date:                  Expected Discharge Plan:  [place]  In-House Referral:     Discharge planning Services  CM Consult  Post Acute Care Choice:    Choice offered to:     DME Arranged:    DME Agency:     HH Arranged:    [place] Agency:     Status of Service:  Completed, signed off  Medicare Important Message Given:  Yes-second notification given Date Medicare IM Given:    Medicare IM give by:    Date Additional Medicare IM Given:    Additional Medicare Important Message give by:     If discussed at [place] of Stay Meetings, dates discussed:    Additional Comments:  Dessa Phi, RN 04/04/2015, 9:30 AM

## 2015-04-04 NOTE — Progress Notes (Signed)
Physical Therapy Treatment Patient Details Name: [name] MRN: [id number] DOB: 1956/09/15 Today's Date: 04/04/2015    History of Present Illness 59 yo male admitted with L LE cellulitis. Recent d/c 03/24/15 to SNF. Hx of DM, chronic LE ulcers, degenerative hip.     PT Comments    Pt requires + 2 total assist to get OOB.  Progressing slowly. See details below.   Follow Up Recommendations  SNF     Equipment Recommendations       Recommendations for Other Services       Precautions / Restrictions Precautions Precautions: Fall Precaution Comments: L LE Chronic Venous Stasis Ulcer (draining) and Limited L hip flexion  Restrictions Weight Bearing Restrictions: No    Mobility  Bed Mobility Overal bed mobility: Needs Assistance Bed Mobility: Sidelying to Sit;Supine to Sit;Sit to Supine;Sit to Sidelying Rolling: Max assist Sidelying to sit: Total assist;+2 for physical assistance Supine to sit: Total assist;+2 for physical assistance Sit to supine: Total assist;+2 for physical assistance Sit to sidelying: Total assist;+2 for physical assistance General bed mobility comments: pt required + 2 total assist and use of over head trapeeze.  Pt present with fixed L hip in extensioin.  Pt was able to transfer from supine to very elevavated bed.  MAX c/o dizziness.  Pt stated he has not been OOB for 4 weeks.  Sat several mins to decrease symptoms however demonstarted increased c/o l calf pain with extremity in depedent position.  After brief staning at side of bed pt required + 2 total assist to lay back down and increased time to position to comfort.   Transfers Overall transfer level: Needs assistance Equipment used: Rolling walker (2 wheeled) Transfers: Sit to/from Stand Sit to Stand: +2 physical assistance;Total assist         General transfer comment: assisted with standing at bed side + 2 total assist to rise.  Once upright pt required + 2 max assist.  Pt was unable to tolerate  any WBing thru his L LE due to 12/10 calf pain.  Pt was only able to stand 1.5 min as he started to shake with fatigue.    Ambulation/Gait                 Stairs            Wheelchair Mobility    Modified Rankin (Stroke Patients Only)       Balance                                    Cognition Arousal/Alertness: Awake/alert Behavior During Therapy: WFL for tasks assessed/performed Overall Cognitive Status: Within Functional Limits for tasks assessed                      Exercises      General Comments        Pertinent Vitals/Pain Pain Assessment: 0-10 Pain Score: 10-Worst pain ever Faces Pain Scale: Hurts worst Pain Location: L calf Pain Descriptors / Indicators: Constant;Crushing;Sharp Pain Intervention(s): Monitored during session;Repositioned;RN gave pain meds during session    Home Living                      Prior Function            PT Goals (current goals can now be found in the care plan section) Progress towards PT goals: Progressing toward goals  Frequency  Min 2X/week    PT Plan      Co-evaluation             End of Session Equipment Utilized During Treatment: Gait belt Activity Tolerance: Patient limited by fatigue;Patient limited by pain Patient left: in bed;with call bell/phone within reach     Time: 1435-1500 PT Time Calculation (min) (ACUTE ONLY): 25 min  Charges:  $Gait Training: 8-22 mins $Therapeutic Activity: 8-22 mins                    G Codes:      Rica Koyanagi  PTA [facility]  Acute  Rehab Pager      [phone]

## 2015-04-04 NOTE — Progress Notes (Addendum)
Clinical Social Work  [facility] re: Geographical information systems officer for SNF. [facility] ultimately decided that since patient has used Medicare days they will honor contract for 42 days. CSW confirmed this information with Wells Guiles [phone] ext [phone]. CSW had previously spoken with Bethena Roys at [facility] who reports that they could accept patient today. CSW tried calling [facility] after DC summary completed and left several messages with admissions Bethena Roys) and left a message with administrator Karlene Einstein). CSW never received a return call and when calling back, Network engineer reported that [facility] asked to tell CSW that they could no longer accept patient. Secretary reports she does not know reason but that that Bethena Roys and Karlene Einstein are both in meetings. CSW left message with director to discuss case since contract is for [place] and no alternative options at this time. CSW will continue to follow.  Sindy Messing, [place] [phone]  Addendum [phone]  Director spoke with [name] who is now agreeable to accept patient. CSW prepared DC packet and informed patient who is happy to be DC. RN to call report. PTAR arranged; request # [id number]. CSW is signing off but available if needed.  [place], [place] [phone]

## 2015-04-04 NOTE — Care Management Note (Signed)
Case Management Note  Patient Details  Name: [name] MRN: [id number] Date of Birth: 05-14-1956  Subjective/Objective: Received call from [facility] transfer coordinator April Alexander-who says she didn't know patient had medicare. I have faxed face sheet w/medicare info.CSW notified.                  Action/Plan:d/c SNF.   Expected Discharge Date:                  Expected Discharge Plan:  [place]  In-House Referral:     Discharge planning Services  CM Consult  Post Acute Care Choice:    Choice offered to:     DME Arranged:    DME Agency:     HH Arranged:    [place] Agency:     Status of Service:  Completed, signed off  Medicare Important Message Given:  Yes-second notification given Date Medicare IM Given:    Medicare IM give by:    Date Additional Medicare IM Given:    Additional Medicare Important Message give by:     If discussed at [place] of Stay Meetings, dates discussed:    Additional Comments:  Dessa Phi, RN 04/04/2015, 12:08 PM

## 2015-04-04 NOTE — Discharge Summary (Addendum)
Physician Discharge Summary  [name] FOY:[id number] DOB: 21-Aug-1956 DOA: 03/26/2015  PCP: Charlsie Merles, MD  Admit date: 03/26/2015 Discharge date: 04/04/2015  Time spent: 20 minutes  Recommendations for Outpatient Follow-up:  1. F/u with PCP in 1-2 weeks 2. Current dressing changes: wet to dry wrapped in Kerlix BID, changes to wound care to be determined at [facility]  Discharge Diagnoses:  Principal Problem:   Cellulitis of left lower extremity Active Problems:   DM (diabetes mellitus), secondary, uncontrolled, with peripheral vascular complications   Leukocytosis   Hypertension   Microcytic anemia   Cellulitis and abscess of leg   Discharge Condition: Improved  Diet recommendation: Diabetic  Filed Weights   03/26/15 0616 03/26/15 1548  Weight: 107.7 kg (237 lb 7 oz) 107.3 kg (236 lb 8.9 oz)    History of present illness:  Please see admit h and p from 7/3 for details. Briefly, pt presented with draining LLE cellulitis that was worsening despite abx prior to admission. The patient was admitted for further work up  Hospital Course:  Complicated cellulitis of left lower extremity associated with uncontrolled DM2 -Pt had been continued on daptomycin and Primaxin since admission per ID recommendations -ID since recommended 7 more days of augmentin post-discharge (last day of Augmenting on 7/18) -Ortho had following and has since recommended continued dressing changes and follow up with wound clinic in one week -Pain control with PRN Percocet and dilaudid. -Pan-cultures so far remain negative. Wound culture without growth -LE edema appears much improved since admit  Uncontrolled type 2 diabetes mellitus with peripheral vascular disease -Blood glucose overall stable on 70/30 insulin -Dose was increased to 25 units -Continue with sliding scale insulin  Essential hypertension -Continued lisinopril and hydralazine -BP stable and controlled  Microcytic  anemia -Iron panel suggests iron deficiency. -On ferrous sulfate  Depression -continued remeron.  Consultations:  ID  Orthopedic Surgery  WOC  Discharge Exam: Filed Vitals:   04/03/15 1613 04/03/15 2134 04/04/15 0536 04/04/15 1312  BP: 122/67 131/78 140/70 122/81  Pulse: 93 92 80 85  Temp: 98.6 F (37 C) 99 F (37.2 C) 98.3 F (36.8 C) 98.5 F (36.9 C)  TempSrc: Oral Oral Oral Oral  Resp: 18 18 18 20   Height:      Weight:      SpO2: 97% 96% 100% 98%    General: Awake, in nad Cardiovascular: regular, s1, s2 Respiratory: normal resp effort, no wheezing  Discharge Instructions     Medication List    STOP taking these medications        HYDROcodone-acetaminophen 5-325 MG per tablet  Commonly known as:  NORCO/VICODIN     PROMOGRAN EX      TAKE these medications        acetaminophen 325 MG tablet  Commonly known as:  TYLENOL  Take 2 tablets (650 mg total) by mouth every 6 (six) hours as needed for mild pain (or Fever >/= 101).     amoxicillin-clavulanate 875-125 MG per tablet  Commonly known as:  AUGMENTIN  Take 1 tablet by mouth every 12 (twelve) hours.     aspirin EC 81 MG tablet  Take 81 mg by mouth every morning.     atorvastatin 80 MG tablet  Commonly known as:  LIPITOR  Take 80 mg by mouth daily.     docusate sodium 100 MG capsule  Commonly known as:  COLACE  Take 1 capsule (100 mg total) by mouth 2 (two) times daily as needed  for mild constipation.     enoxaparin 150 MG/ML injection  Commonly known as:  LOVENOX  Inject 0.36 mLs (55 mg total) into the skin daily.     famotidine 20 MG tablet  Commonly known as:  PEPCID  Take 1 tablet (20 mg total) by mouth 2 (two) times daily.     ferrous sulfate 325 (65 FE) MG tablet  Take 325 mg by mouth 3 (three) times daily with meals.     gabapentin 300 MG capsule  Commonly known as:  NEURONTIN  Take 300 mg by mouth 3 (three) times daily.     hydrALAZINE 25 MG tablet  Commonly known as:   APRESOLINE  Take 1 tablet (25 mg total) by mouth every 8 (eight) hours.     hydrocerin Crea  Apply 1 application topically 2 (two) times daily.     ibuprofen 800 MG tablet  Commonly known as:  ADVIL,MOTRIN  Take 800 mg by mouth every 8 (eight) hours as needed for fever or moderate pain.     insulin aspart 100 UNIT/ML injection  Commonly known as:  novoLOG  Inject 0-9 Units into the skin 3 (three) times daily with meals.     insulin aspart protamine- aspart (70-30) 100 UNIT/ML injection  Commonly known as:  NOVOLOG MIX 70/30  Inject 0.25 mLs (25 Units total) into the skin 2 (two) times daily with a meal.     lisinopril 10 MG tablet  Commonly known as:  PRINIVIL,ZESTRIL  Take 1 tablet (10 mg total) by mouth daily.     meloxicam 7.5 MG tablet  Commonly known as:  MOBIC  Take 7.5 mg by mouth 2 (two) times daily.     mirtazapine 15 MG tablet  Commonly known as:  REMERON  Take 15 mg by mouth at bedtime.     multivitamin with minerals Tabs tablet  Take 1 tablet by mouth daily.     ondansetron 4 MG tablet  Commonly known as:  ZOFRAN  Take 1 tablet (4 mg total) by mouth every 6 (six) hours as needed for nausea.     oxybutynin 10 MG 24 hr tablet  Commonly known as:  DITROPAN-XL  Take 10 mg by mouth at bedtime.     oxyCODONE-acetaminophen 5-325 MG per tablet  Commonly known as:  PERCOCET/ROXICET  Take 1-2 tablets by mouth every 4 (four) hours as needed for moderate pain or severe pain.     polyethylene glycol packet  Commonly known as:  MIRALAX / GLYCOLAX  Take 17 g by mouth daily as needed for mild constipation.     protein supplement Powd  Commonly known as:  UNJURY CHOCOLATE CLASSIC  Take 7 g (2 oz total) by mouth 3 (three) times daily.     tamsulosin 0.4 MG Caps capsule  Commonly known as:  FLOMAX  Take 0.4 mg by mouth daily after supper.     TRIXAICIN 0.025 % cream  Generic drug:  capsicum oleoresin  Apply 1 application topically 2 (two) times daily.        Allergies  Allergen Reactions  . Other     Pt is a Jehov[employer] Witness. No blood products.  . Sulfa Antibiotics     Causes flu-like symptom : sweating, chills, fever, body aches   Follow-up Information    Follow up with Charlsie Merles, MD. Schedule an appointment as soon as possible for a visit in 1 week.   Specialty:  Internal Medicine   Contact information:   [phone]  [place] [address] [phone]       Follow up with [facility] in [place]. Schedule an appointment as soon as possible for a visit in 1 week.   Why:  Normally sees [facility] follow up       The results of significant diagnostics from this hospitalization (including imaging, microbiology, ancillary and laboratory) are listed below for reference.    Significant Diagnostic Studies: Dg Chest 2 View  03/14/2015   CLINICAL DATA:  Patient with fever for 2 days. Bilateral lower extremity wounds.  EXAM: CHEST  2 VIEW  COMPARISON:  None.  FINDINGS: Monitoring leads overlie the patient. Low lung volumes. Cardiac and mediastinal contours upper limits of normal. Elevation right hemidiaphragm. Bibasilar heterogeneous opacities. No pleural effusion or pneumothorax. Lateral view nondiagnostic due to overlapping soft tissue.  IMPRESSION: Low lung volumes with bibasilar opacities favored to represent atelectasis.  Cardiac contours upper limits of normal.   Electronically Signed   By: Lovey Newcomer M.D.   On: 03/14/2015 21:07   Dg Tibia/fibula Right  03/14/2015   CLINICAL DATA:  Bilateral lower extremity wounds, fever  EXAM: RIGHT TIBIA AND FIBULA - 2 VIEW  COMPARISON:  None.  FINDINGS: Soft tissue ulceration along the medial aspect of the distal tibia.  No radiographic findings to suggest osteomyelitis.  No fracture or dislocation is seen.  Degenerative changes of the knee.  Soft tissue swelling along the medial ankle.  IMPRESSION: Soft tissue ulceration  along the medial aspect of the distal tibia. No radiographic findings to suggest osteomyelitis.   Electronically Signed   By: Julian Hy M.D.   On: 03/14/2015 21:04   Ct Chest W Contrast  03/17/2015   CLINICAL DATA:  Chronic right lower extremity ulceration. Fever and chills. Pain. Lower extremity swelling. Fever of unknown origin. Diabetes.  EXAM: CT CHEST, ABDOMEN, AND PELVIS WITH CONTRAST  TECHNIQUE: Multidetector CT imaging of the chest, abdomen and pelvis was performed following the standard protocol during bolus administration of intravenous contrast.  CONTRAST:  153mL OMNIPAQUE IOHEXOL 300 MG/ML  SOLN  COMPARISON:  Multiple exams, including 03/14/2015  FINDINGS: CT CHEST FINDINGS  Mediastinum/Nodes: Borderline prominent right paratracheal lymph nodes. Small bilateral axillary lymph nodes are observed. A right hilar lymph node measures 1.2 cm in short axis, mildly prominent. An AP window lymph node is also mildly prominent at 1.2 cm.  Atherosclerotic calcification of the aortic arch and left anterior descending coronary artery. Mild cardiomegaly. This involves all 4 chambers.  Small bilateral pleural effusions with associated passive atelectasis. Small epicardial lymph nodes are present.  Lungs/Pleura: Secondary pulmonary interstitial lobular thickening at the lung apices. Mild diffuse interstitial accentuation with some faint ground-glass opacities in the lower lobes. No definite nodularity or abnormal enhancement along the pleural margins to indicate specificity for exudative effusion.  Musculoskeletal: Lower cervical and lower thoracic spondylosis.  CT ABDOMEN PELVIS FINDINGS  Hepatobiliary: Small gallstones are visible in the gallbladder neck. No biliary dilatation.  Pancreas: Unremarkable  Spleen: Unremarkable  Adrenals/Urinary Tract: Unremarkable  Stomach/Bowel: Periampullary duodenal diverticula do not appear inflamed. There is a larger transverse duodenal diverticulum measuring 4.5 cm in  diameter.  Appendix not well seen. Prominent stool throughout the colon favors constipation.  Vascular/Lymphatic: Aortoiliac atherosclerotic vascular disease. Bilateral prominent inguinal lymph nodes with a left upper inguinal lymph node measuring 1.8 cm in short axis (image 112, series 2,) and a left lower right inguinal lymph node also measuring  1.8 cm in short axis (image 128, series 2). A right external iliac node measures 1.4 cm in short axis, image 111 of series 2. Small retroperitoneal lymph nodes are present.  Reproductive: Unremarkable  Other: There is subcutaneous edema tracking along both flanks and overlying both hips and extending into the upper thighs, of uncertain significance.  Musculoskeletal: Markedly severe degenerative arthropathy of the left hip. Moderate to severe degenerative arthropathy of the right hip. Lumbar spondylosis and degenerative disc disease noted with disc bulge most notable at the L3-4 level.  IMPRESSION: 1. Four chamber cardiomegaly with secondary pulmonary lobular interstitial thickening and faint ground-glass opacity suggesting interstitial pulmonary edema. Mild nodal enlargement in the chest could be secondary to passive congestion but is nonspecific. 2. There is subcutaneous edema tracking along the flanks and upper thighs, of uncertain significance. In addition, there is mild adenopathy in the lower pelvis and inguinal regions. Although potentially reactive, into the such as lymphoma are not specifically excluded, and if the palpable Ingle oral abnormality fails to resolve with appropriate therapy, biopsy might be considered. 3. 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 4. Coronary, thoracic aortic, and aortoiliac atherosclerotic calcification. 5. Cervical, thoracic, and lumbar spondylosis with lumbar degenerative disc disease. 6. Multiple duodenal diverticula, none of which appear inflamed. 7.  Prominent stool throughout the colon favors  constipation.   Electronically Signed   By: Van Clines M.D.   On: 03/17/2015 15:38   Ct Abdomen Pelvis W Contrast  03/17/2015   CLINICAL DATA:  Chronic right lower extremity ulceration. Fever and chills. Pain. Lower extremity swelling. Fever of unknown origin. Diabetes.  EXAM: CT CHEST, ABDOMEN, AND PELVIS WITH CONTRAST  TECHNIQUE: Multidetector CT imaging of the chest, abdomen and pelvis was performed following the standard protocol during bolus administration of intravenous contrast.  CONTRAST:  162mL OMNIPAQUE IOHEXOL 300 MG/ML  SOLN  COMPARISON:  Multiple exams, including 03/14/2015  FINDINGS: CT CHEST FINDINGS  Mediastinum/Nodes: Borderline prominent right paratracheal lymph nodes. Small bilateral axillary lymph nodes are observed. A right hilar lymph node measures 1.2 cm in short axis, mildly prominent. An AP window lymph node is also mildly prominent at 1.2 cm.  Atherosclerotic calcification of the aortic arch and left anterior descending coronary artery. Mild cardiomegaly. This involves all 4 chambers.  Small bilateral pleural effusions with associated passive atelectasis. Small epicardial lymph nodes are present.  Lungs/Pleura: Secondary pulmonary interstitial lobular thickening at the lung apices. Mild diffuse interstitial accentuation with some faint ground-glass opacities in the lower lobes. No definite nodularity or abnormal enhancement along the pleural margins to indicate specificity for exudative effusion.  Musculoskeletal: Lower cervical and lower thoracic spondylosis.  CT ABDOMEN PELVIS FINDINGS  Hepatobiliary: Small gallstones are visible in the gallbladder neck. No biliary dilatation.  Pancreas: Unremarkable  Spleen: Unremarkable  Adrenals/Urinary Tract: Unremarkable  Stomach/Bowel: Periampullary duodenal diverticula do not appear inflamed. There is a larger transverse duodenal diverticulum measuring 4.5 cm in diameter.  Appendix not well seen. Prominent stool throughout the colon  favors constipation.  Vascular/Lymphatic: Aortoiliac atherosclerotic vascular disease. Bilateral prominent inguinal lymph nodes with a left upper inguinal lymph node measuring 1.8 cm in short axis (image 112, series 2,) and a left lower right inguinal lymph node also measuring 1.8 cm in short axis (image 128, series 2). A right external iliac node measures 1.4 cm in short axis, image 111 of series 2. Small retroperitoneal lymph nodes are present.  Reproductive: Unremarkable  Other: There is subcutaneous edema tracking along both  flanks and overlying both hips and extending into the upper thighs, of uncertain significance.  Musculoskeletal: Markedly severe degenerative arthropathy of the left hip. Moderate to severe degenerative arthropathy of the right hip. Lumbar spondylosis and degenerative disc disease noted with disc bulge most notable at the L3-4 level.  IMPRESSION: 1. Four chamber cardiomegaly with secondary pulmonary lobular interstitial thickening and faint ground-glass opacity suggesting interstitial pulmonary edema. Mild nodal enlargement in the chest could be secondary to passive congestion but is nonspecific. 2. There is subcutaneous edema tracking along the flanks and upper thighs, of uncertain significance. In addition, there is mild adenopathy in the lower pelvis and inguinal regions. Although potentially reactive, into the such as lymphoma are not specifically excluded, and if the palpable Ingle oral abnormality fails to resolve with appropriate therapy, biopsy might be considered. 3. 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 4. Coronary, thoracic aortic, and aortoiliac atherosclerotic calcification. 5. Cervical, thoracic, and lumbar spondylosis with lumbar degenerative disc disease. 6. Multiple duodenal diverticula, none of which appear inflamed. 7.  Prominent stool throughout the colon favors constipation.   Electronically Signed   By: Van Clines M.D.   On:  03/17/2015 15:38   Mr Tibia Fibula Right Wo Contrast  03/21/2015   CLINICAL DATA:  Type 2 diabetes. Chronic RIGHT lower extremity ulcer. Fevers, chills and pain. Bilateral lower extremity swelling. LEFT leg swelling. Evaluate for osteomyelitis or abscess.  EXAM: MRI OF LOWER RIGHT EXTREMITY WITHOUT CONTRAST  TECHNIQUE: Multiplanar, multisequence MR imaging of the LEFT leg was performed. No intravenous contrast was administered.  COMPARISON:  03/14/2015.  FINDINGS: There is swelling of the LEFT leg diffusely. This predominantly involves the subcutaneous tissues. Achilles tendon and ankle tendons appear within normal limits.  Diffuse subcutaneous edema is present in both legs, LEFT-greater-than-RIGHT without focal fluid collection to suggest abscess.  Incidental visualization of the contralateral RIGHT leg demonstrates atrophy of the RIGHT peroneus longus muscle.  Tibia and fibula marrow signal is normal. Negative for osteomyelitis. Cutaneous blistering is present in the anterior LEFT leg, probably associated with soft tissue infection.  IMPRESSION: LEFT-greater-than- RIGHT leg diffuse subcutaneous edema with mild muscular edema. No abscess. No osteomyelitis.   Electronically Signed   By: Dereck Ligas M.D.   On: 03/21/2015 08:27   Mr Foot Right Wo Contrast  03/21/2015   CLINICAL DATA:  Diffuse lower extremity edema. Type 2 diabetic with foot and leg ulceration. LEFT-greater-than- RIGHT leg swelling. Initial encounter.  EXAM: MRI OF THE RIGHT FOOT WITHOUT CONTRAST  TECHNIQUE: Multiplanar, multisequence MR imaging was performed. No intravenous contrast was administered.  COMPARISON:  None.  FINDINGS: Field of view extends from the distal leg to the midshaft metatarsals.  No osteomyelitis in the hindfoot or ankle. Diffuse edema is present in Kager's fat pad. The Achilles tendon appears within normal limits. Mild ankle osteoarthritis is present with small osteochondral lesion of the talar dome. Posterior medial  and posterior lateral tendon groups appear normal. Anterior tendon group is normal. No tenosynovitis. No evidence of septic arthritis. Small ankle effusion.  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 Negative for osteomyelitis. Mild midfoot osteoarthritis without neuropathic changes. No soft tissue abscess is identified. There appears to be blistering in the skin of the medial distal leg.  IMPRESSION: Negative for osteomyelitis or soft tissue abscess. Diffuse edema of the distal RIGHT leg and ankle which may represent dependent edema or cellulitis in the appropriate clinical setting.   Electronically Signed   By: Cay Schillings  Lamke M.D.   On: 03/21/2015 08:33   Mr Tibia Fibula Left Wo Contrast  03/21/2015   CLINICAL DATA:  Diabetic patient with lower extremity swelling, fever, chills and pain, left worse than right. Subsequent encounter.  EXAM: MRI OF LOWER LEFT EXTREMITY WITHOUT CONTRAST  TECHNIQUE: Multiplanar, multisequence MR imaging of the left lower leg was performed. No intravenous contrast was administered.  COMPARISON:  MRI left lower leg 03/16/2015.  FINDINGS: Imaging incidentally includes the right lower leg. Intense subcutaneous edema in the left lower leg is again seen and not notably changed compared to the prior MRI of. Subcutaneous edema is worse on the left compared to the right. No focal fluid collection is identified. Image lower leg musculature demonstrates some fatty atrophy. No intramuscular fluid collection or evidence of myositis is present. Bone marrow signal is normal.  IMPRESSION: Intense subcutaneous edema of the left lower leg most consistent with cellulitis. Negative for abscess or osteomyelitis.   Electronically Signed   By: Inge Rise M.D.   On: 03/21/2015 08:18   Mr Tibia Fibula Left Wo Contrast  03/16/2015   CLINICAL DATA:  Osteomyelitis of the foot and leg. Lateral foot ulcerations. Compartment syndrome.  EXAM: MRI OF THE LEFT FOREFOOT WITHOUT CONTRAST;  MRI OF LOWER LEFT EXTREMITY WITHOUT CONTRAST  TECHNIQUE: Multiplanar, multisequence MR imaging was performed. No intravenous contrast was administered.  COMPARISON:  03/14/2015.  FINDINGS: LEFT foot: Diffuse edema is present over the dorsum of the foot. There is no discrete abscess or osteomyelitis. Flexor and extensor tendons appear within normal limits.  LEFT leg: Diffuse subcutaneous edema is present in both legs, greater on the LEFT when compared to the RIGHT. Bone marrow signal is normal. Mild edema is present in the medial head of the gastrocnemius, probably reactive. Flexor and extensor tendons around the ankle appear normal. Neurovascular bundles are within normal limits. No abscess.  IMPRESSION: Diffuse cellulitis of the LEFT leg and foot, without abscess or osteomyelitis. Left-greater-than-right leg swelling with a diameter of the LEFT leg about 1.5 times the RIGHT leg.   Electronically Signed   By: Dereck Ligas M.D.   On: 03/16/2015 08:42   Mr Foot Left Wo Contrast  03/21/2015   CLINICAL DATA:  Diabetic patient with lower extremity swelling, fever, chills and pain, left worse than right. Subsequent encounter.  EXAM: MRI OF THE LEFT FOREFOOT WITHOUT CONTRAST  TECHNIQUE: Multiplanar, multisequence MR imaging was performed. No intravenous contrast was administered.  COMPARISON:  MRI left foot 03/16/2015.  FINDINGS: Marked subcutaneous edema over the dorsum of the foot does not appear notably changed. No focal fluid collection is identified. There is no bone marrow signal abnormality to suggest osteomyelitis. Intrinsic musculature of the foot demonstrates some fatty atrophy. All visualized tendons are intact and unremarkable appearance. Joint spaces are preserved. No joint effusion is identified.  IMPRESSION: Intense subcutaneous edema over the foot compatible with cellulitis and/or dependent change. Negative for osteomyelitis or abscess.   Electronically Signed   By: Inge Rise M.D.   On:  03/21/2015 08:26   Mr Foot Left Wo Contrast  03/16/2015   CLINICAL DATA:  Osteomyelitis of the foot and leg. Lateral foot ulcerations. Compartment syndrome.  EXAM: MRI OF THE LEFT FOREFOOT WITHOUT CONTRAST; MRI OF LOWER LEFT EXTREMITY WITHOUT CONTRAST  TECHNIQUE: Multiplanar, multisequence MR imaging was performed. No intravenous contrast was administered.  COMPARISON:  03/14/2015.  FINDINGS: LEFT foot: Diffuse edema is present over the dorsum of the foot. There is no discrete abscess or osteomyelitis.  Flexor and extensor tendons appear within normal limits.  LEFT leg: Diffuse subcutaneous edema is present in both legs, greater on the LEFT when compared to the RIGHT. Bone marrow signal is normal. Mild edema is present in the medial head of the gastrocnemius, probably reactive. Flexor and extensor tendons around the ankle appear normal. Neurovascular bundles are within normal limits. No abscess.  IMPRESSION: Diffuse cellulitis of the LEFT leg and foot, without abscess or osteomyelitis. Left-greater-than-right leg swelling with a diameter of the LEFT leg about 1.5 times the RIGHT leg.   Electronically Signed   By: Dereck Ligas M.D.   On: 03/16/2015 08:42   Dg Chest Port 1 View  03/26/2015   CLINICAL DATA:  Acute onset of generalized chest pain and shortness of breath. Initial encounter.  EXAM: PORTABLE CHEST - 1 VIEW  COMPARISON:  Chest radiograph from 03/14/2015, and CTA of the chest performed 03/17/2015  FINDINGS: The lungs are hypoexpanded. Vascular crowding and vascular congestion are seen. Mild right basilar airspace opacity could reflect mild pneumonia or possibly minimal interstitial edema. No pleural effusion or pneumothorax is seen.  The cardiomediastinal silhouette is borderline enlarged. No acute osseous abnormalities are identified.  IMPRESSION: Lungs hypoexpanded. Vascular congestion and borderline cardiomegaly. Mild right basilar airspace opacity could reflect mild pneumonia or possibly minimal  interstitial edema.   Electronically Signed   By: Garald Balding M.D.   On: 03/26/2015 02:46   Dg Foot Complete Left  03/14/2015   CLINICAL DATA:  .Bilateral lower extremity wounds with swelling. Symptoms for 2 weeks. LEFT foot ulcerations.  EXAM: LEFT FOOT - COMPLETE 3+ VIEW  COMPARISON:  None.  FINDINGS: Osteopenia. No plain film evidence of osteomyelitis. Radiopaque density is present over the medial ankle, likely representing wound dressing. No gas in the soft tissues of the foot. Diffuse forefoot soft tissue swelling. Anatomic alignment. Negative for fracture.  IMPRESSION: Diffuse swelling of the foot without plain film evidence of osteomyelitis.   Electronically Signed   By: Dereck Ligas M.D.   On: 03/14/2015 21:08    Microbiology: Recent Results (from the past 240 hour(s))  Culture, blood (routine x 2)     Status: None   Collection Time: 03/26/15  7:12 AM  Result Value Ref Range Status   Specimen Description BLOOD LEFT ARM  Final   Special Requests BOTTLES DRAWN AEROBIC AND ANAEROBIC 10CC  Final   Culture   Final    NO GROWTH 5 DAYS Performed at [facility]    Report Status 03/31/2015 FINAL  Final  Culture, blood (routine x 2)     Status: None   Collection Time: 03/26/15  7:17 AM  Result Value Ref Range Status   Specimen Description BLOOD RIGHT ARM  Final   Special Requests BOTTLES DRAWN AEROBIC AND ANAEROBIC 10CC  Final   Culture   Final    NO GROWTH 5 DAYS Performed at [facility]    Report Status 03/31/2015 FINAL  Final  Wound culture     Status: None   Collection Time: 03/28/15  2:00 PM  Result Value Ref Range Status   Specimen Description LEG LEFT  Final   Special Requests Normal  Final   Gram Stain   Final    RARE WBC PRESENT,BOTH PMN AND MONONUCLEAR NO SQUAMOUS EPITHELIAL CELLS SEEN NO ORGANISMS SEEN Performed at [facility]    Culture   Final    MULTIPLE ORGANISMS PRESENT, NONE PREDOMINANT Note: NO STAPHYLOCOCCUS AUREUS ISOLATED NO  GROUP A  STREP (S.PYOGENES) ISOLATED Performed at [facility]    Report Status 03/31/2015 FINAL  Final     Labs: Basic Metabolic Panel:  Recent Labs Lab 03/31/15 0538 04/01/15 0536 04/02/15 0544  NA 135 135 138  K 4.4 4.2 4.3  CL 102 101 104  CO2 22 27 27   GLUCOSE 222* 166* 116*  BUN 17 23* 23*  CREATININE 0.69 0.73 0.70  CALCIUM 8.8* 8.7* 8.8*   Liver Function Tests: No results for input(s): AST, ALT, ALKPHOS, BILITOT, PROT, ALBUMIN in the last 168 hours. No results for input(s): LIPASE, AMYLASE in the last 168 hours. No results for input(s): AMMONIA in the last 168 hours. CBC:  Recent Labs Lab 04/01/15 0536  WBC 7.7  HGB 8.8*  HCT 29.4*  MCV 65.2*  PLT 559*   Cardiac Enzymes:  Recent Labs Lab 04/03/15 0525  CKTOTAL 60   BNP: BNP (last 3 results) No results for input(s): BNP in the last 8760 hours.  ProBNP (last 3 results) No results for input(s): PROBNP in the last 8760 hours.  CBG:  Recent Labs Lab 04/03/15 1611 04/03/15 2138 04/03/15 2322 04/04/15 0726 04/04/15 1133  GLUCAP 177* 184* 195* 157* 138*    Signed:  Trenell Concannon K  [facility] 04/04/2015, 2:30 PM

## 2015-04-04 NOTE — Clinical Social Work Placement (Addendum)
   CLINICAL SOCIAL WORK PLACEMENT  NOTE  Date:  04/04/2015  Patient Details  Name: [name] MRN: [id number] Date of Birth: 07-24-1956  Clinical Social Work is seeking post-discharge placement for this patient at the [place] level of care (*CSW will initial, date and re-position this form in  chart as items are completed):  Yes   Patient/family provided with [facility] Work Department's list of facilities offering this level of care within the geographic area requested by the patient (or if unable, by the patient's family).  Yes   Patient/family informed of their freedom to choose among providers that offer the needed level of care, that participate in Medicare, Medicaid or managed care program needed by the patient, have an available bed and are willing to accept the patient.  Yes   Patient/family informed of [facility] ownership interest in [facility] and [facility], as well as of the fact that they are under no obligation to receive care at these facilities.  PASRR submitted to EDS on       PASRR number received on       Existing PASRR number confirmed on 03/27/15     FL2 transmitted to all facilities in geographic area requested by pt/family on 03/27/15     FL2 transmitted to all facilities within larger geographic area on       Patient informed that his/her managed care company has contracts with or will negotiate with certain facilities, including the following:        Yes   Patient/family informed of bed offers received.  Patient chooses bed at  [facility]  Physician recommends and patient chooses bed at [facility]      Patient to be transferred to  on .  Patient to be transferred to facility by    [facility]  Patient family notified on  04/04/15 of transfer.  Name of family member notified:     Patient will update friends and family  PHYSICIAN       Additional Comment:     _______________________________________________ Boone Master, [place] 04/04/2015, 4:31 PM

## 2015-04-25 ENCOUNTER — Inpatient Hospital Stay (HOSPITAL_COMMUNITY)
Admission: AD | Admit: 2015-04-25 | Discharge: 2015-04-27 | DRG: 638 | Disposition: A | Discharge status: Skilled Nursing Facility | Payer: Non-veteran care | Source: Other Acute Inpatient Hospital | Attending: Internal Medicine | Admitting: Internal Medicine

## 2015-04-25 DIAGNOSIS — L97929 Non-pressure chronic ulcer of unspecified part of left lower leg with unspecified severity: Secondary | ICD-10-CM | POA: Diagnosis present

## 2015-04-25 DIAGNOSIS — F17210 Nicotine dependence, cigarettes, uncomplicated: Secondary | ICD-10-CM | POA: Diagnosis present

## 2015-04-25 DIAGNOSIS — L97909 Non-pressure chronic ulcer of unspecified part of unspecified lower leg with unspecified severity: Secondary | ICD-10-CM | POA: Insufficient documentation

## 2015-04-25 DIAGNOSIS — E1351 Other specified diabetes mellitus with diabetic peripheral angiopathy without gangrene: Secondary | ICD-10-CM | POA: Diagnosis not present

## 2015-04-25 DIAGNOSIS — E119 Type 2 diabetes mellitus without complications: Secondary | ICD-10-CM | POA: Diagnosis present

## 2015-04-25 DIAGNOSIS — L03116 Cellulitis of left lower limb: Secondary | ICD-10-CM | POA: Diagnosis present

## 2015-04-25 DIAGNOSIS — G8929 Other chronic pain: Secondary | ICD-10-CM | POA: Diagnosis present

## 2015-04-25 DIAGNOSIS — E11628 Type 2 diabetes mellitus with other skin complications: Principal | ICD-10-CM | POA: Diagnosis present

## 2015-04-25 DIAGNOSIS — L03119 Cellulitis of unspecified part of limb: Secondary | ICD-10-CM | POA: Insufficient documentation

## 2015-04-25 DIAGNOSIS — F329 Major depressive disorder, single episode, unspecified: Secondary | ICD-10-CM | POA: Diagnosis present

## 2015-04-25 DIAGNOSIS — K219 Gastro-esophageal reflux disease without esophagitis: Secondary | ICD-10-CM | POA: Diagnosis present

## 2015-04-25 DIAGNOSIS — I83209 Varicose veins of unspecified lower extremity with both ulcer of unspecified site and inflammation: Secondary | ICD-10-CM | POA: Diagnosis present

## 2015-04-25 DIAGNOSIS — L97919 Non-pressure chronic ulcer of unspecified part of right lower leg with unspecified severity: Secondary | ICD-10-CM | POA: Diagnosis present

## 2015-04-25 DIAGNOSIS — L02416 Cutaneous abscess of left lower limb: Secondary | ICD-10-CM | POA: Diagnosis present

## 2015-04-25 DIAGNOSIS — D509 Iron deficiency anemia, unspecified: Secondary | ICD-10-CM | POA: Diagnosis present

## 2015-04-25 DIAGNOSIS — M169 Osteoarthritis of hip, unspecified: Secondary | ICD-10-CM | POA: Diagnosis present

## 2015-04-25 DIAGNOSIS — L02419 Cutaneous abscess of limb, unspecified: Secondary | ICD-10-CM | POA: Diagnosis not present

## 2015-04-25 DIAGNOSIS — Z794 Long term (current) use of insulin: Secondary | ICD-10-CM | POA: Diagnosis not present

## 2015-04-25 DIAGNOSIS — E1165 Type 2 diabetes mellitus with hyperglycemia: Secondary | ICD-10-CM | POA: Diagnosis present

## 2015-04-25 DIAGNOSIS — E1151 Type 2 diabetes mellitus with diabetic peripheral angiopathy without gangrene: Secondary | ICD-10-CM | POA: Diagnosis present

## 2015-04-25 DIAGNOSIS — M79605 Pain in left leg: Secondary | ICD-10-CM | POA: Insufficient documentation

## 2015-04-25 DIAGNOSIS — M160 Bilateral primary osteoarthritis of hip: Secondary | ICD-10-CM | POA: Diagnosis present

## 2015-04-25 DIAGNOSIS — I10 Essential (primary) hypertension: Secondary | ICD-10-CM | POA: Diagnosis present

## 2015-04-25 DIAGNOSIS — E1365 Other specified diabetes mellitus with hyperglycemia: Secondary | ICD-10-CM

## 2015-04-25 DIAGNOSIS — B9689 Other specified bacterial agents as the cause of diseases classified elsewhere: Secondary | ICD-10-CM | POA: Diagnosis not present

## 2015-04-25 HISTORY — DX: Unspecified osteoarthritis, unspecified site: M19.90

## 2015-04-25 HISTORY — DX: Pure hypercholesterolemia, unspecified: E78.00

## 2015-04-25 HISTORY — DX: Cellulitis of unspecified part of limb: L03.119

## 2015-04-25 HISTORY — DX: Thalassemia minor: D56.3

## 2015-04-25 HISTORY — DX: Pneumonia, unspecified organism: J18.9

## 2015-04-25 HISTORY — DX: Anemia, unspecified: D64.9

## 2015-04-25 HISTORY — DX: Cutaneous abscess of limb, unspecified: L02.419

## 2015-04-25 HISTORY — DX: Type 2 diabetes mellitus without complications: E11.9

## 2015-04-25 HISTORY — DX: Sleep apnea, unspecified: G47.30

## 2015-04-25 LAB — CBC WITH DIFFERENTIAL/PLATELET
Basophils Absolute: 0.1 10*3/uL (ref 0.0–0.1)
Basophils Relative: 1 % (ref 0–1)
Eosinophils Absolute: 0.4 10*3/uL (ref 0.0–0.7)
Eosinophils Relative: 5 % (ref 0–5)
HCT: 27.8 % — ABNORMAL LOW (ref 39.0–52.0)
HEMOGLOBIN: 8.4 g/dL — AB (ref 13.0–17.0)
LYMPHS ABS: 2.5 10*3/uL (ref 0.7–4.0)
Lymphocytes Relative: 32 % (ref 12–46)
MCH: 19.4 pg — ABNORMAL LOW (ref 26.0–34.0)
MCHC: 30.2 g/dL (ref 30.0–36.0)
MCV: 64.2 fL — ABNORMAL LOW (ref 78.0–100.0)
Monocytes Absolute: 0.5 10*3/uL (ref 0.1–1.0)
Monocytes Relative: 7 % (ref 3–12)
NEUTROS ABS: 4.3 10*3/uL (ref 1.7–7.7)
NEUTROS PCT: 55 % (ref 43–77)
Platelets: 321 10*3/uL (ref 150–400)
RBC: 4.33 MIL/uL (ref 4.22–5.81)
RDW: 18.2 % — ABNORMAL HIGH (ref 11.5–15.5)
WBC: 7.8 10*3/uL (ref 4.0–10.5)

## 2015-04-25 LAB — GLUCOSE, CAPILLARY: Glucose-Capillary: 164 mg/dL — ABNORMAL HIGH (ref 65–99)

## 2015-04-25 LAB — PREALBUMIN: Prealbumin: 13.7 mg/dL — ABNORMAL LOW (ref 18–38)

## 2015-04-25 LAB — SEDIMENTATION RATE: SED RATE: 77 mm/h — AB (ref 0–16)

## 2015-04-25 LAB — C-REACTIVE PROTEIN: CRP: 4.3 mg/dL — AB (ref <1.0)

## 2015-04-25 LAB — CK: CK TOTAL: 59 U/L (ref 49–397)

## 2015-04-25 MED ORDER — ONDANSETRON HCL 4 MG/2ML IJ SOLN: 4.0000 mg | Freq: Four times a day (QID) | INTRAMUSCULAR | Status: DC | PRN

## 2015-04-25 MED ORDER — MIRTAZAPINE 7.5 MG PO TABS
15.0000 mg | ORAL_TABLET | Freq: Every day | ORAL | Status: DC
  Administered 2015-04-25 – 2015-04-26 (×2): 15 mg via ORAL
  Filled 2015-04-25 (×2): qty 2

## 2015-04-25 MED ORDER — ASPIRIN EC 81 MG PO TBEC
81.0000 mg | DELAYED_RELEASE_TABLET | ORAL | Status: DC
  Administered 2015-04-26 – 2015-04-27 (×2): 81 mg via ORAL
  Filled 2015-04-25 (×2): qty 1

## 2015-04-25 MED ORDER — FERROUS SULFATE 325 (65 FE) MG PO TABS
325.0000 mg | ORAL_TABLET | Freq: Three times a day (TID) | ORAL | Status: DC
  Administered 2015-04-26 – 2015-04-27 (×4): 325 mg via ORAL
  Filled 2015-04-25 (×4): qty 1

## 2015-04-25 MED ORDER — TAMSULOSIN HCL 0.4 MG PO CAPS
0.4000 mg | ORAL_CAPSULE | Freq: Every day | ORAL | Status: DC
  Administered 2015-04-26: 0.4 mg via ORAL
  Filled 2015-04-25: qty 1

## 2015-04-25 MED ORDER — SODIUM CHLORIDE 0.9 % IJ SOLN
3.0000 mL | Freq: Two times a day (BID) | INTRAMUSCULAR | Status: DC
  Administered 2015-04-25 – 2015-04-27 (×4): 3 mL via INTRAVENOUS

## 2015-04-25 MED ORDER — HYDRALAZINE HCL 25 MG PO TABS
25.0000 mg | ORAL_TABLET | Freq: Three times a day (TID) | ORAL | Status: DC
  Administered 2015-04-25 – 2015-04-27 (×6): 25 mg via ORAL
  Filled 2015-04-25 (×6): qty 1

## 2015-04-25 MED ORDER — ACETAMINOPHEN 650 MG RE SUPP: 650.0000 mg | Freq: Four times a day (QID) | RECTAL | Status: DC | PRN

## 2015-04-25 MED ORDER — FAMOTIDINE 20 MG PO TABS
20.0000 mg | ORAL_TABLET | Freq: Two times a day (BID) | ORAL | Status: DC
  Administered 2015-04-25 – 2015-04-27 (×4): 20 mg via ORAL
  Filled 2015-04-25 (×4): qty 1

## 2015-04-25 MED ORDER — DOCUSATE SODIUM 100 MG PO CAPS: 100.0000 mg | ORAL_CAPSULE | Freq: Two times a day (BID) | ORAL | Status: DC | PRN

## 2015-04-25 MED ORDER — ONDANSETRON HCL 4 MG PO TABS: 4.0000 mg | ORAL_TABLET | Freq: Four times a day (QID) | ORAL | Status: DC | PRN

## 2015-04-25 MED ORDER — OXYBUTYNIN CHLORIDE ER 10 MG PO TB24
10.0000 mg | ORAL_TABLET | Freq: Every day | ORAL | Status: DC
  Administered 2015-04-25 – 2015-04-26 (×2): 10 mg via ORAL
  Filled 2015-04-25 (×3): qty 1

## 2015-04-25 MED ORDER — ENOXAPARIN SODIUM 40 MG/0.4ML ~~LOC~~ SOLN
40.0000 mg | Freq: Every day | SUBCUTANEOUS | Status: DC
  Administered 2015-04-25 – 2015-04-26 (×2): 40 mg via SUBCUTANEOUS
  Filled 2015-04-25 (×2): qty 0.4

## 2015-04-25 MED ORDER — INSULIN ASPART 100 UNIT/ML ~~LOC~~ SOLN: 0.0000 [IU] | Freq: Every day | SUBCUTANEOUS | Status: DC

## 2015-04-25 MED ORDER — POLYETHYLENE GLYCOL 3350 17 G PO PACK
17.0000 g | PACK | Freq: Every day | ORAL | Status: DC
  Administered 2015-04-26: 17 g via ORAL
  Filled 2015-04-25: qty 1

## 2015-04-25 MED ORDER — IBUPROFEN 800 MG PO TABS
800.0000 mg | ORAL_TABLET | Freq: Three times a day (TID) | ORAL | Status: DC | PRN
  Administered 2015-04-26 – 2015-04-27 (×2): 800 mg via ORAL
  Filled 2015-04-25: qty 1
  Filled 2015-04-25: qty 4

## 2015-04-25 MED ORDER — INSULIN ASPART 100 UNIT/ML ~~LOC~~ SOLN
0.0000 [IU] | Freq: Three times a day (TID) | SUBCUTANEOUS | Status: DC
  Administered 2015-04-26: 2 [IU] via SUBCUTANEOUS
  Administered 2015-04-26: 5 [IU] via SUBCUTANEOUS
  Administered 2015-04-26: 3 [IU] via SUBCUTANEOUS
  Administered 2015-04-27: 5 [IU] via SUBCUTANEOUS

## 2015-04-25 MED ORDER — MELOXICAM 7.5 MG PO TABS
7.5000 mg | ORAL_TABLET | Freq: Two times a day (BID) | ORAL | Status: DC
  Administered 2015-04-25 – 2015-04-27 (×4): 7.5 mg via ORAL
  Filled 2015-04-25 (×4): qty 1

## 2015-04-25 MED ORDER — MORPHINE SULFATE 2 MG/ML IJ SOLN
2.0000 mg | INTRAMUSCULAR | Status: DC | PRN
  Administered 2015-04-26: 2 mg via INTRAVENOUS
  Filled 2015-04-25: qty 1

## 2015-04-25 MED ORDER — ACETAMINOPHEN 325 MG PO TABS: 650.0000 mg | ORAL_TABLET | Freq: Four times a day (QID) | ORAL | Status: DC | PRN

## 2015-04-25 MED ORDER — OXYCODONE-ACETAMINOPHEN 5-325 MG PO TABS
1.0000 | ORAL_TABLET | ORAL | Status: DC | PRN
Start: 2015-04-25 — End: 2015-04-27
  Administered 2015-04-25 – 2015-04-27 (×6): 2 via ORAL
  Filled 2015-04-25 (×6): qty 2

## 2015-04-25 MED ORDER — SODIUM CHLORIDE 0.9 % IV SOLN
600.0000 mg | Freq: Two times a day (BID) | INTRAVENOUS | Status: DC
  Administered 2015-04-25 – 2015-04-26 (×2): 600 mg via INTRAVENOUS
  Filled 2015-04-25 (×3): qty 600

## 2015-04-25 MED ORDER — GABAPENTIN 300 MG PO CAPS
300.0000 mg | ORAL_CAPSULE | Freq: Three times a day (TID) | ORAL | Status: DC
  Administered 2015-04-25 – 2015-04-27 (×5): 300 mg via ORAL
  Filled 2015-04-25 (×5): qty 1

## 2015-04-25 MED ORDER — INSULIN ASPART PROT & ASPART (70-30 MIX) 100 UNIT/ML ~~LOC~~ SUSP
25.0000 [IU] | Freq: Two times a day (BID) | SUBCUTANEOUS | Status: DC
  Administered 2015-04-26 – 2015-04-27 (×3): 25 [IU] via SUBCUTANEOUS
  Filled 2015-04-25: qty 10

## 2015-04-25 NOTE — H&P (Addendum)
[facility] History and Physical  Patient: [name]  MRN: [id number]  DOB: December 14, 1955  DOS: the patient was seen and examined on 04/25/2015 PCP: Charlsie Merles, MD  Referring physician: Juliann Pares Chief Complaint: Nonhealing ulcer  HPI: [name] is a 59 y.o. male with Past medical history of diabetes mellitus, uncontrolled, GERD, hypertension, chronic bilateral hip arthritis. The patient is presenting with complaints of nonhealing ulcer of the left leg. Patient had recent admissions in the hospital at which time he has been treated broadly with multiple antibiotics and also has undergone recurrent MRIs which showed cellulitis. Patient was also seen by infectious disease as well as orthopedic. Reason admission was on 03/26/2015 and he was discharged on 04/04/2015 rehabilitation facility. Patient was discharged on Augmentin for 7 more days. He was getting daily wound care dressing at the nursing facility was also working with physical therapy and occupation therapy. Over last weekend he has been having increasing pain in his left calf and the nursing staff has noted that he has some oozing coming from a new area on the left calf. They have dressed the wound and the patient was scheduled for a routine follow-up at the wound care center where he was found to be having purulent discharge coming out from that area as well as tunneling and he was sent to ER for admission. At [facility] the patient was given vancomycin and Zosyn and was sent here for further workup. At the time of my evaluation the patient denies any complaints of fever, chills, headache, dizziness, nausea, vomiting, chest pain, shortness of breath, cough, abdominal pain, diarrhea, burning urination. He was complaining of some constipation on and off. He did complain of left hip pain which is chronic for him. He denied any focal deficit.  The patient is coming from home.  At his baseline ambulates with  walker. And is independent for most of his ADL manages her medication on his own.  Review of Systems: as mentioned in the history of present illness.  A comprehensive review of the other systems is negative.  Past Medical History  Diagnosis Date  . Diabetes mellitus without complication   . Hypertension   . Chronic hip pain   . GERD (gastroesophageal reflux disease)   . Depression    Past Surgical History  Procedure Laterality Date  . Rotator cuff repair    . Hernia repair     Social History:  reports that he has been smoking.  He does not have any smokeless tobacco history on file. He reports that he drinks alcohol. He reports that he does not use illicit drugs.  Allergies  Allergen Reactions  . Other     Pt is a Jehov[employer] Witness. No blood products.  . Sulfa Antibiotics     Causes flu-like symptom : sweating, chills, fever, body aches    Family History  Problem Relation Age of Onset  . Diabetes Mellitus II Mother   . Diabetes Mellitus II Father   . Diabetes Mellitus II Brother     Prior to Admission medications   Medication Sig Start Date End Date Taking? Authorizing Provider  acetaminophen (TYLENOL) 325 MG tablet Take 2 tablets (650 mg total) by mouth every 6 (six) hours as needed for mild pain (or Fever >/= 101). 03/23/15   Robbie Lis, MD  amoxicillin-clavulanate (AUGMENTIN) 875-125 MG per tablet Take 1 tablet by mouth every 12 (twelve) hours. 04/04/15   Donne Hazel, MD  aspirin EC 81 MG  tablet Take 81 mg by mouth every morning.    Historical Provider, MD  atorvastatin (LIPITOR) 80 MG tablet Take 80 mg by mouth daily.    Historical Provider, MD  capsicum oleoresin (TRIXAICIN) 0.025 % cream Apply 1 application topically 2 (two) times daily.    Historical Provider, MD  docusate sodium (COLACE) 100 MG capsule Take 1 capsule (100 mg total) by mouth 2 (two) times daily as needed for mild constipation. 03/23/15   Robbie Lis, MD  enoxaparin (LOVENOX) 150 MG/ML injection  Inject 0.36 mLs (55 mg total) into the skin daily. 03/23/15   Robbie Lis, MD  famotidine (PEPCID) 20 MG tablet Take 1 tablet (20 mg total) by mouth 2 (two) times daily. 03/23/15   Robbie Lis, MD  ferrous sulfate 325 (65 FE) MG tablet Take 325 mg by mouth 3 (three) times daily with meals.    Historical Provider, MD  gabapentin (NEURONTIN) 300 MG capsule Take 300 mg by mouth 3 (three) times daily.    Historical Provider, MD  hydrALAZINE (APRESOLINE) 25 MG tablet Take 1 tablet (25 mg total) by mouth every 8 (eight) hours. 03/23/15   Robbie Lis, MD  hydrocerin (EUCERIN) CREA Apply 1 application topically 2 (two) times daily. 04/04/15   Donne Hazel, MD  ibuprofen (ADVIL,MOTRIN) 800 MG tablet Take 800 mg by mouth every 8 (eight) hours as needed for fever or moderate pain.    Historical Provider, MD  insulin aspart (NOVOLOG) 100 UNIT/ML injection Inject 0-9 Units into the skin 3 (three) times daily with meals. 03/23/15   Robbie Lis, MD  insulin aspart protamine- aspart (NOVOLOG MIX 70/30) (70-30) 100 UNIT/ML injection Inject 0.25 mLs (25 Units total) into the skin 2 (two) times daily with a meal. 04/04/15   Donne Hazel, MD  lisinopril (PRINIVIL,ZESTRIL) 10 MG tablet Take 1 tablet (10 mg total) by mouth daily. 04/04/15   Donne Hazel, MD  meloxicam (MOBIC) 7.5 MG tablet Take 7.5 mg by mouth 2 (two) times daily.    Historical Provider, MD  mirtazapine (REMERON) 15 MG tablet Take 15 mg by mouth at bedtime.    Historical Provider, MD  Multiple Vitamin (MULTIVITAMIN WITH MINERALS) TABS tablet Take 1 tablet by mouth daily. 03/23/15   Robbie Lis, MD  ondansetron (ZOFRAN) 4 MG tablet Take 1 tablet (4 mg total) by mouth every 6 (six) hours as needed for nausea. 03/23/15   Robbie Lis, MD  oxybutynin (DITROPAN-XL) 10 MG 24 hr tablet Take 10 mg by mouth at bedtime.    Historical Provider, MD  oxyCODONE-acetaminophen (PERCOCET/ROXICET) 5-325 MG per tablet Take 1-2 tablets by mouth every 4 (four) hours as  needed for moderate pain or severe pain. 04/04/15   Donne Hazel, MD  polyethylene glycol [facility] / Floria Raveling) packet Take 17 g by mouth daily as needed for mild constipation. 04/04/15   Donne Hazel, MD  protein supplement (UNJURY CHOCOLATE CLASSIC) POWD Take 7 g (2 oz total) by mouth 3 (three) times daily. 03/23/15   Robbie Lis, MD  tamsulosin (FLOMAX) 0.4 MG CAPS capsule Take 0.4 mg by mouth daily after supper.    Historical Provider, MD   Physical Exam: Filed Vitals:   04/25/15 2018  BP: 142/69  Pulse: 72  Temp: 98.1 F (36.7 C)  TempSrc: Oral  Height: 6' 4.25" (1.937 m)  Weight: 99.791 kg (220 lb)  SpO2: 100%   General: Alert, Awake and Oriented to Time, Place and  Person. Appear in moderate distress Eyes: PERRL ENT: Oral Mucosa clear moist. Neck: no JVD Cardiovascular: S1 and S2 Present, no Murmur, Peripheral Pulses Present Respiratory: Bilateral Air entry equal and Decreased,  Clear to Auscultation, no Crackles, no wheezes Abdomen: Bowel Sound present, Soft and non tender Skin: Black color discoloration of the left leg, multiple leg ulcers on the left leg as well as right leg is wrapped in bandage. Extremities: bilateral Pedal edema, bilateral calf tenderness Neurologic: Grossly no focal neuro deficit.  Labs on Admission:  CBC: No results for input(s): WBC, NEUTROABS, HGB, HCT, MCV, PLT in the last 168 hours.  CMP     Component Value Date/Time   NA 138 04/02/2015 0544   K 4.3 04/02/2015 0544   CL 104 04/02/2015 0544   CO2 27 04/02/2015 0544   GLUCOSE 116* 04/02/2015 0544   BUN 23* 04/02/2015 0544   CREATININE 0.70 04/02/2015 0544   CALCIUM 8.8* 04/02/2015 0544   PROT 6.5 03/15/2015 0110   ALBUMIN 2.7* 03/15/2015 0110   AST 37 03/15/2015 0110   ALT 52 03/15/2015 0110   ALKPHOS 131* 03/15/2015 0110   BILITOT 0.5 03/15/2015 0110   GFRNONAA >60 04/02/2015 0544   GFRAA >60 04/02/2015 0544   Assessment/Plan Principal Problem:   Cellulitis and abscess of  leg Active Problems:   DM (diabetes mellitus), secondary, uncontrolled, with peripheral vascular complications   Anemia, iron deficiency   Hypertension   Degenerative arthritis of hip   1. Cellulitis and abscess of leg The patient is presenting with recurrent cellulitis and abscess of the leg. Prior workup if shows that he has always developed cellulitis without any osteomyelitis or abscess or polymyositis. This time he has tunneling developed as well as draining wound. Currently he is hemodynamically stable does not appear to have any fever or leukocytosis. I discussed with ID on phone and the patient will be placed on Ceftarolin empirically and they will follow up with the patient in the morning. Orthopedic will also be consulted in the morning to follow-up with the patient on wound care. We will check CPK ESR CRP. Nutrition as well as diabetic consultation.  2. Diabetes mellitus uncontrolled. Recent hemoglobin A1c significantly elevated. We'll check vascular ABI. Continuing his home insulin 7030 regimen as well as placing him on sliding scale. Carb modified diet.  3. Chronic degenerative disease of the hip. Continuing when necessary Percocet. Bowel regimen is also continued.  4. Hypertension. Continuing the lisinopril.  5. Chronic anemia. Monitor H&H.  Advance goals of care discussion: full code   Consults: I discussed with Dr Baxter Flattery over phone from ID.  DVT Prophylaxis: subcutaneous Heparin Nutrition: diabetic diet  Disposition: Admitted as inpatient, medsurge unit.  Author: Berle Mull, MD [facility] Pager: [phone] 04/25/2015  If 7PM-7AM, please contact night-coverage [email or web address] Password [email or web address]

## 2015-04-26 ENCOUNTER — Encounter (HOSPITAL_COMMUNITY): Payer: Self-pay | Admitting: General Practice

## 2015-04-26 ENCOUNTER — Inpatient Hospital Stay (HOSPITAL_COMMUNITY): Payer: Non-veteran care

## 2015-04-26 ENCOUNTER — Ambulatory Visit (HOSPITAL_COMMUNITY): Payer: Medicare Other

## 2015-04-26 ENCOUNTER — Encounter (HOSPITAL_COMMUNITY): Payer: Medicare Other

## 2015-04-26 DIAGNOSIS — L97211 Non-pressure chronic ulcer of right calf limited to breakdown of skin: Secondary | ICD-10-CM

## 2015-04-26 DIAGNOSIS — E1165 Type 2 diabetes mellitus with hyperglycemia: Secondary | ICD-10-CM

## 2015-04-26 DIAGNOSIS — B9689 Other specified bacterial agents as the cause of diseases classified elsewhere: Secondary | ICD-10-CM

## 2015-04-26 DIAGNOSIS — L97909 Non-pressure chronic ulcer of unspecified part of unspecified lower leg with unspecified severity: Secondary | ICD-10-CM | POA: Insufficient documentation

## 2015-04-26 DIAGNOSIS — L97929 Non-pressure chronic ulcer of unspecified part of left lower leg with unspecified severity: Secondary | ICD-10-CM | POA: Insufficient documentation

## 2015-04-26 DIAGNOSIS — F17210 Nicotine dependence, cigarettes, uncomplicated: Secondary | ICD-10-CM

## 2015-04-26 DIAGNOSIS — E1151 Type 2 diabetes mellitus with diabetic peripheral angiopathy without gangrene: Secondary | ICD-10-CM

## 2015-04-26 DIAGNOSIS — L03116 Cellulitis of left lower limb: Secondary | ICD-10-CM

## 2015-04-26 DIAGNOSIS — M79605 Pain in left leg: Secondary | ICD-10-CM | POA: Insufficient documentation

## 2015-04-26 DIAGNOSIS — L97221 Non-pressure chronic ulcer of left calf limited to breakdown of skin: Secondary | ICD-10-CM

## 2015-04-26 DIAGNOSIS — E11622 Type 2 diabetes mellitus with other skin ulcer: Secondary | ICD-10-CM

## 2015-04-26 DIAGNOSIS — L03119 Cellulitis of unspecified part of limb: Secondary | ICD-10-CM

## 2015-04-26 DIAGNOSIS — L02419 Cutaneous abscess of limb, unspecified: Secondary | ICD-10-CM

## 2015-04-26 LAB — CBC WITH DIFFERENTIAL/PLATELET
Basophils Absolute: 0.1 10*3/uL (ref 0.0–0.1)
Basophils Relative: 1 % (ref 0–1)
EOS ABS: 0.3 10*3/uL (ref 0.0–0.7)
Eosinophils Relative: 6 % — ABNORMAL HIGH (ref 0–5)
HCT: 25.5 % — ABNORMAL LOW (ref 39.0–52.0)
Hemoglobin: 7.8 g/dL — ABNORMAL LOW (ref 13.0–17.0)
Lymphocytes Relative: 36 % (ref 12–46)
Lymphs Abs: 2 10*3/uL (ref 0.7–4.0)
MCH: 19.4 pg — ABNORMAL LOW (ref 26.0–34.0)
MCHC: 30.6 g/dL (ref 30.0–36.0)
MCV: 63.4 fL — ABNORMAL LOW (ref 78.0–100.0)
MONOS PCT: 5 % (ref 3–12)
Monocytes Absolute: 0.3 10*3/uL (ref 0.1–1.0)
NEUTROS ABS: 2.9 10*3/uL (ref 1.7–7.7)
Neutrophils Relative %: 52 % (ref 43–77)
PLATELETS: 299 10*3/uL (ref 150–400)
RBC: 4.02 MIL/uL — ABNORMAL LOW (ref 4.22–5.81)
RDW: 18 % — ABNORMAL HIGH (ref 11.5–15.5)
WBC: 5.6 10*3/uL (ref 4.0–10.5)

## 2015-04-26 LAB — COMPREHENSIVE METABOLIC PANEL
ALBUMIN: 2.8 g/dL — AB (ref 3.5–5.0)
ALBUMIN: 2.6 g/dL — AB (ref 3.5–5.0)
ALK PHOS: 183 U/L — AB (ref 38–126)
ALT: 43 U/L (ref 17–63)
ALT: 39 U/L (ref 17–63)
AST: 25 U/L (ref 15–41)
AST: 22 U/L (ref 15–41)
Alkaline Phosphatase: 199 U/L — ABNORMAL HIGH (ref 38–126)
Anion gap: 8 (ref 5–15)
Anion gap: 6 (ref 5–15)
BUN: 13 mg/dL (ref 6–20)
BUN: 12 mg/dL (ref 6–20)
CO2: 26 mmol/L (ref 22–32)
CO2: 25 mmol/L (ref 22–32)
Calcium: 9.1 mg/dL (ref 8.9–10.3)
Calcium: 8.7 mg/dL — ABNORMAL LOW (ref 8.9–10.3)
Chloride: 105 mmol/L (ref 101–111)
Chloride: 106 mmol/L (ref 101–111)
Creatinine, Ser: 0.65 mg/dL (ref 0.61–1.24)
Creatinine, Ser: 0.63 mg/dL (ref 0.61–1.24)
GFR calc Af Amer: >60 mL/min (ref >60)
GFR calc Af Amer: >60 mL/min (ref >60)
GFR calc non Af Amer: >60 mL/min (ref >60)
GFR calc non Af Amer: >60 mL/min (ref >60)
Glucose, Bld: 164 mg/dL — ABNORMAL HIGH (ref 65–99)
Glucose, Bld: 257 mg/dL — ABNORMAL HIGH (ref 65–99)
POTASSIUM: 4.3 mmol/L (ref 3.5–5.1)
Potassium: 4.2 mmol/L (ref 3.5–5.1)
Sodium: 139 mmol/L (ref 135–145)
Sodium: 137 mmol/L (ref 135–145)
Total Bilirubin: 0.3 mg/dL (ref 0.3–1.2)
Total Bilirubin: 0.3 mg/dL (ref 0.3–1.2)
Total Protein: 7.2 g/dL (ref 6.5–8.1)
Total Protein: 6.7 g/dL (ref 6.5–8.1)

## 2015-04-26 LAB — GLUCOSE, CAPILLARY
GLUCOSE-CAPILLARY: 245 mg/dL — AB (ref 65–99)
GLUCOSE-CAPILLARY: 152 mg/dL — AB (ref 65–99)
Glucose-Capillary: 130 mg/dL — ABNORMAL HIGH (ref 65–99)
Glucose-Capillary: 134 mg/dL — ABNORMAL HIGH (ref 65–99)
Glucose-Capillary: 104 mg/dL — ABNORMAL HIGH (ref 65–99)

## 2015-04-26 MED ORDER — CEFAZOLIN SODIUM 1-5 GM-% IV SOLN
1.0000 g | Freq: Three times a day (TID) | INTRAVENOUS | Status: DC
Start: 2015-04-26 — End: 2015-04-27
  Administered 2015-04-26 – 2015-04-27 (×2): 1 g via INTRAVENOUS
  Filled 2015-04-26 (×5): qty 50

## 2015-04-26 MED ORDER — JUVEN PO PACK
1.0000 | PACK | Freq: Two times a day (BID) | ORAL | Status: DC
  Filled 2015-04-26 (×3): qty 1

## 2015-04-26 MED ORDER — GLUCERNA SHAKE PO LIQD
237.0000 mL | Freq: Three times a day (TID) | ORAL | Status: DC
  Administered 2015-04-26 – 2015-04-27 (×2): 237 mL via ORAL

## 2015-04-26 MED ORDER — ADULT MULTIVITAMIN W/MINERALS CH
1.0000 | ORAL_TABLET | Freq: Every day | ORAL | Status: DC
  Administered 2015-04-26 – 2015-04-27 (×2): 1 via ORAL
  Filled 2015-04-26 (×2): qty 1

## 2015-04-26 NOTE — Consult Note (Signed)
WOC wound consult note Reason for Consult: LE wounds, patient well known to this [place]. Wound type: Venous stasis ulcerations with venous dermatitis Measurement: RLE: 5cm x 2cmx 0.2cm  LLE: lateral-2.0cm x 1.0cm x 0.2cm  LLE: medial/posterior-2.0cm x 0.5cm x0.2cm and 2.0cm x 2.0cm x 0.5cm  Wound bed: all the wounds are clean, pink, moist. The LLE medial wound does have some fibrin/slough but minimal. Drainage (amount, consistency, odor) moderate, serosanguinous, noted some thicker yellow drainage on the LLE lateral but upon further palpation no additional drainage seen.  Periwound: brawny skin changes with venous dermatitis Dressing procedure/placement/frequency: Silicone foam to cover the more superficial ulcers, add silver hydrofiber to the one ulcer with depth. Ortho tech to apply bilateral Unna's boots and to change Tues/Friday  I my opinion this patient need to have sustained compression therapy in order to support healing of his venous stasis ulcers.   When he returns to the SNF, MD can you please request that the SNF continue 1-2 x wk Unna's boots until the ulcers have resolved and the patient can then utilize compression stockings.    The patient reports that once he was transferred to [facility] that the Unna's boots were only changed twice and then [facility].   [place] team will follow along with you for weekly wound assessments.  Please notify me of any acute changes in the wounds or any new areas of concerns  Para March RN,CWOCN [phone]

## 2015-04-26 NOTE — Progress Notes (Signed)
Initial Nutrition Assessment  DOCUMENTATION CODES:   Not applicable  INTERVENTION:   -Glucerna Shake po BID, each supplement provides 220 kcal and 10 grams of protein -MVI daily -Juven BID  NUTRITION DIAGNOSIS:   Increased nutrient needs related to wound healing as evidenced by estimated needs.  GOAL:   Patient will meet greater than or equal to 90% of their needs  MONITOR:   PO intake, Supplement acceptance, Labs, Weight trends, Skin, I & O's  REASON FOR ASSESSMENT:   Consult Wound healing  ASSESSMENT:   [name] is a 59 y.o. male with Past medical history of diabetes mellitus, uncontrolled, GERD, hypertension, chronic bilateral hip arthritis. Admitted with nonhealing ulcer on lt leg.  Pt admitted from [facility] with nonhealing leg ulcer.   Attempted to examine pt x 2, however, was in with MD at times of visits.   Reviewed COWRN note on 04/26/15. Pt with venous stasis ulcers on RLE and LLE x2.   Pt consumed 50% of his breakfast this AM  Pt has experienced a 16# (6.8%) wt loss over the past month. Suspect weight loss may be due to poor appetite and increased nutritional needs due to wound healing. RD will add supplements to maximize nutritional intake.   Diet Order:  Diet Carb Modified Fluid consistency:: Thin; Room service appropriate?: Yes  Skin:  Wound (see comment) (stage II PU sacrum, bilateral leg DM ulcers)  Last BM:  04/25/15  Height:   Ht Readings from Last 1 Encounters:  04/25/15 6' 4.25" (1.937 m)    Weight:   Wt Readings from Last 1 Encounters:  04/25/15 220 lb (99.791 kg)    Ideal Body Weight:  94.1 kg  BMI:  Body mass index is 26.6 kg/(m^2).  Estimated Nutritional Needs:   Kcal:  7341-9379  Protein:  130-145 grams  Fluid:  2.3- 2.5 L  EDUCATION NEEDS:   No education needs identified at this time  [name] A. Jimmye Norman, RD, LDN, CDE Pager: [phone] After hours Pager: [phone]

## 2015-04-26 NOTE — Care Management Note (Signed)
Case Management Note Marvetta Gibbons RN, BSN Unit 2W-Case Manager [phone] Covering 3W  Patient Details  Name: [name] MRN: [id number] Date of Birth: 1956/02/11  Subjective/Objective:    Pt admitted with recurrent cellulitis                Action/Plan: PTA pt was at [facility] for rehab under [facility] contract for 30 days ending on 05/16/15 - CSW aware and to follow for return to SNF when medically stable- spoke with pt at bedside who confirmed that he was at the [facility] wound clinic yesterday and sent here from there - he states he does want to return to [facility] and finish his rehab he also states that he is working with the [place] on housing. Call placed to [facility]- spoke with Broadus John who verified that the [facility] sent pt to [facility] from the [facility] ED due to pt recently being tx here- pt does have an open contract for [facility] and can return there- (if in hospital longer than 7 days SNF contract will need to be updated through the [place])- Pts primary CSW at the [facility] clinic is Ernst Breach- [phone] ext. [phone] if needed.   Expected Discharge Date:                  Expected Discharge Plan:  Skilled Nursing Facility  In-House Referral:  Clinical Social Work  Discharge planning Services  CM Consult  Post Acute Care Choice:    Choice offered to:     DME Arranged:    DME Agency:     HH Arranged:    [place] Agency:     Status of Service:  In process, will continue to follow  Medicare Important Message Given:    Date Medicare IM Given:    Medicare IM give by:    Date Additional Medicare IM Given:    Additional Medicare Important Message give by:     If discussed at [place] of Stay Meetings, dates discussed:    Additional Comments:  Dawayne Patricia, RN 04/26/2015, 11:17 AM

## 2015-04-26 NOTE — Progress Notes (Signed)
Consult Note:  Spoke with patient about diabetes and home regimen for diabetes control. Patient reports that he gets his medication and is managed by the [place] for his DM. He takes Metformin BID and 70/30 25 units BID. Patient mentions that before his 2 week stay at [facility] he was seeing his glucose levels in the 100-300 range. During his stay at [facility] patient said his glucose was still in the 100-300 range but was placed on sliding scale and his insulin was being adjusted. Inquired about knowledge about A1C and patient reports that he does know what an A1C is. Discussed A1C results (9.8% on 03/16/2015) and discussed basic home care, importance of checking CBGs and maintaining good CBG control to prevent long-term and short-term complications. 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  Discussed impact of nutrition, exercise, stress, sickness on diabetes control.  Patient states that he always drinks diet ad light beverages and tries to follow a carb modified diet. Patient mentions that he knows what to do to control his glucose and knowing his A1c gives him an idea of changes he needs to make. He also is familiar with infection effects on glucose control.  Patient verbalized understanding of information discussed and has no further questions at this time related to diabetes.   Thanks, Tama Headings RN, MSN, [facility] Inpatient Diabetes Coordinator Team Pager [phone]

## 2015-04-26 NOTE — Progress Notes (Signed)
Chaplain responded to spiritual care consult "to talk." Pt says he is in much less distress than when we initially asked to see a chaplain. Pt asked for a Bible and for prayer. Chaplain accommodated pt needs. Pt appreciated chaplain presence. Page chaplain as needed.    04/26/15 1200  Clinical Encounter Type  Visited With Patient  Visit Type Initial;Spiritual support  Referral From Nurse  Spiritual Encounters  Spiritual Needs Emotional;Sacred text  Stress Factors  Patient Stress Factors Health changes  Laketia Vicknair, Epifanio Lesches 04/26/2015 12:31 PM

## 2015-04-26 NOTE — Progress Notes (Addendum)
[facility] PROGRESS NOTE  Diontae Route RKY:[id number] DOB: Aug 01, 1956 DOA: 04/25/2015 PCP: Charlsie Merles, MD  Assessment/Plan: Principal Problem:   Cellulitis and abscess of leg Active Problems:   DM (diabetes mellitus), secondary, uncontrolled, with peripheral vascular complications   Anemia, iron deficiency   Hypertension   Degenerative arthritis of hip   Cellulitis and abscess of the leg Continue IV Ceftarolin empirically. Does not appear to be septic. The patient did not follow-up with orthopedics as recommended. Patient is currently hemodynamically stable. Patient has a history of chronic venous insufficiency. He also is a smoker. Will order ABI to rule out arterial insufficiency. Tight  glucose control. Consult ID if no improvement Wound care recommends sustained compression therapy in order to support healing of his venous stasis ulcers.SNF continue 1-2 x wk Unna's boots until the ulcers have resolved and the patient can then utilize compression stockings.  If culture negative the patient can be discharged in 48 hours on by mouth antibiotics  Uncontrolled type 2 diabetes mellitus with peripheral vascular disease -Blood glucose overall stable on 70/30 insulin, 25 units twice a day -Continue with sliding scale insulin  Essential hypertension -Continued lisinopril and hydralazine -BP stable and controlled  Microcytic anemia, baseline 8.0 - 8.5 -Iron panel suggests iron deficiency. -On ferrous sulfate  Depression -continued remeron.  Code Status:      Code Status Orders        Start     Ordered   04/25/15 2050  Full code   Continuous     04/25/15 2050         Family Communication: family updated about patient's clinical progress Disposition Plan:  Continue wound care, await for culture results   Brief narrative: 59 y.o. male with Past medical history of diabetes mellitus, uncontrolled, GERD, hypertension, chronic bilateral hip arthritis. The  patient is presenting with complaints of nonhealing ulcer of the left leg. Patient had recent admissions in the hospital at which time he has been treated broadly with multiple antibiotics and also has undergone recurrent MRIs which showed cellulitis. Patient was also seen by infectious disease as well as orthopedic. Reason admission was on 03/26/2015 and he was discharged on 04/04/2015 rehabilitation facility. Patient was discharged on Augmentin for 7 more days. He was getting daily wound care dressing at the nursing facility was also working with physical therapy and occupation therapy. Over last weekend he has been having increasing pain in his left calf and the nursing staff has noted that he has some oozing coming from a new area on the left calf. They have dressed the wound and the patient was scheduled for a routine follow-up at the wound care center where he was found to be having purulent discharge coming out from that area as well as tunneling and he was sent to ER for admission. At [facility],The the patient was given vancomycin and Zosyn and was sent here for further workup. At the time of my evaluation the patient denies any complaints of fever, chills, headache, dizziness, nausea, vomiting, chest pain, shortness of breath, cough, abdominal pain, diarrhea, burning urination. He was complaining of some constipation on and off. He did complain of left hip pain which is chronic for him. He denied any focal deficit. Consultants:  None  Procedures:  None  Antibiotics: Anti-infectives    Start     Dose/Rate Route Frequency Ordered Stop   04/25/15 2200  ceftaroline (TEFLARO) 600 mg in sodium chloride 0.9 % 250 mL  IVPB     600 mg 250 mL/hr over 60 Minutes Intravenous Every 12 hours 04/25/15 2116           HPI/Subjective: Patient completed a seven-day course of antibiotics but continued to have drainage from his left lower extremity states that he has been afebrile,    Objective: Filed Vitals:   04/25/15 2018 04/26/15 0500  BP: 142/69 147/65  Pulse: 72 71  Temp: 98.1 F (36.7 C) 98.2 F (36.8 C)  TempSrc: Oral Oral  Height: 6' 4.25" (1.937 m)   Weight: 99.791 kg (220 lb)   SpO2: 100% 98%    Intake/Output Summary (Last 24 hours) at 04/26/15 1326 Last data filed at 04/26/15 1031  Gross per 24 hour  Intake    360 ml  Output    375 ml  Net    -15 ml    Exam:  General: No acute respiratory distress Lungs: Clear to auscultation bilaterally without wheezes or crackles Cardiovascular: Regular rate and rhythm without murmur gallop or rub normal S1 and S2 Abdomen: Nontender, nondistended, soft, bowel sounds positive, no rebound, no ascites, no appreciable mass Extremities: No significant cyanosis, clubbing, or edema bilateral lower extremities     Data Review   Micro Results No results found for this or any previous visit (from the past 240 hour(s)).  Radiology Reports No results found.   CBC  Recent Labs Lab 04/25/15 2158 04/26/15 0816  WBC 7.8 5.6  HGB 8.4* 7.8*  HCT 27.8* 25.5*  PLT 321 299  MCV 64.2* 63.4*  MCH 19.4* 19.4*  MCHC 30.2 30.6  RDW 18.2* 18.0*  LYMPHSABS 2.5 2.0  MONOABS 0.5 0.3  EOSABS 0.4 0.3  BASOSABS 0.1 0.1    Chemistries   Recent Labs Lab 04/25/15 2158 04/26/15 0816  NA 139 137  K 4.2 4.3  CL 105 106  CO2 26 25  GLUCOSE 164* 257*  BUN 13 12  CREATININE 0.65 0.63  CALCIUM 9.1 8.7*  AST 25 22  ALT 43 39  ALKPHOS 199* 183*  BILITOT 0.3 0.3   ------------------------------------------------------------------------------------------------------------------ estimated creatinine clearance is 119.9 mL/min (by C-G formula based on Cr of 0.63). ------------------------------------------------------------------------------------------------------------------ No results for input(s): HGBA1C in the last 72  hours. ------------------------------------------------------------------------------------------------------------------ No results for input(s): CHOL, HDL, LDLCALC, TRIG, CHOLHDL, LDLDIRECT in the last 72 hours. ------------------------------------------------------------------------------------------------------------------ No results for input(s): TSH, T4TOTAL, T3FREE, THYROIDAB in the last 72 hours.  Invalid input(s): FREET3 ------------------------------------------------------------------------------------------------------------------ No results for input(s): VITAMINB12, FOLATE, FERRITIN, TIBC, IRON, RETICCTPCT in the last 72 hours.  Coagulation profile No results for input(s): INR, PROTIME in the last 168 hours.  No results for input(s): DDIMER in the last 72 hours.  Cardiac Enzymes No results for input(s): CKMB, TROPONINI, MYOGLOBIN in the last 168 hours.  Invalid input(s): CK ------------------------------------------------------------------------------------------------------------------ Invalid input(s): POCBNP   CBG:  Recent Labs Lab 04/25/15 2154 04/26/15 0729 04/26/15 1123  GLUCAP 164* 245* 130*       Studies: No results found.    Lab Results  Component Value Date   HGBA1C 9.8* 03/16/2015   Lab Results  Component Value Date   CREATININE 0.63 04/26/2015       Scheduled Meds: . aspirin EC  81 mg Oral BH-q7a  . ceFTAROline (TEFLARO) IV  600 mg Intravenous Q12H  . enoxaparin (LOVENOX) injection  40 mg Subcutaneous QHS  . famotidine  20 mg Oral BID  . ferrous sulfate  325 mg Oral TID WC  . gabapentin  300 mg Oral TID  .  hydrALAZINE  25 mg Oral 3 times per day  . insulin aspart  0-15 Units Subcutaneous TID WC  . insulin aspart  0-5 Units Subcutaneous QHS  . insulin aspart protamine- aspart  25 Units Subcutaneous BID WC  . meloxicam  7.5 mg Oral BID  . mirtazapine  15 mg Oral QHS  . oxybutynin  10 mg Oral QHS  . polyethylene glycol  17 g Oral  Daily  . sodium chloride  3 mL Intravenous Q12H  . tamsulosin  0.4 mg Oral QPC supper   Continuous Infusions:   Principal Problem:   Cellulitis and abscess of leg Active Problems:   DM (diabetes mellitus), secondary, uncontrolled, with peripheral vascular complications   Anemia, iron deficiency   Hypertension   Degenerative arthritis of hip    Time spent: 45 minutes   [facility] Pager [phone] . If 7PM-7AM, please contact night-coverage at [email or web address], password [facility] 04/26/2015, 1:26 PM  LOS: 1 day

## 2015-04-26 NOTE — Consult Note (Signed)
[facility] for Infectious Disease  Total days of antibiotics: 2        Ceftaroline 8/2>>       Reason for Consult: Cellulitis and Abscess of Left Leg   Referring Physician: Dr. Allyson Sabal  Principal Problem:   Cellulitis and abscess of leg Active Problems:   DM (diabetes mellitus), secondary, uncontrolled, with peripheral vascular complications   Anemia, iron deficiency   Hypertension   Degenerative arthritis of hip    HPI: [name] is a 59 y.o. man with PMHx of HTN, chronic hip pain, and Type 2 DM who presented on 8/2 with a nonhealing ulcer of the left lower extremity. Patient was recently hospitalized from 2/3-5/61 for a complicated cellulitis of his left lower extremity. He completed 9 days of Daptomycin and Imipenem and was then discharged on Augmentin to complete a 7 day course (end date 7/18).   Patient notes he was discharged to [facility] and had been doing well with his physical therapy. He was receiving daily dressing changes at the facility. He notes he started to have left leg pain on 8/30 and a nurse noted blisters and weeping on a new area of his leg. This new wound was dressed and patient was taken to the wound care center for further evaluation a few days later. When the wound was undressed at the wound clinic there was noted to be a significant amount of pus and bleeding. He denies any fevers, chills, nausea, or vomiting. He notes his last HbA1c was elevated at 9.8. He also reports smoking history and is a current smoker (2-3 cigarettes per day). He denies any history of PAD, but does report having ABIs performed before at another hospital.   Past Medical History  Diagnosis Date  . Hypertension   . Chronic hip pain   . GERD (gastroesophageal reflux disease)   . Depression   . Hypercholesterolemia   . Type II diabetes mellitus   . Pneumonia 1960's X 1  . Anemia   . Sleep apnea     "couldn't wear the mask" (04/26/2015)  . Thalassemia minor     . Arthritis     "left hip" (04/26/2015)  . Cellulitis and abscess of leg hositalized 04/25/2015    left    Allergies:  Allergies  Allergen Reactions  . Other     Pt is a Jehovah Witness. No blood products.  . Sulfa Antibiotics     Causes flu-like symptom : sweating, chills, fever, body aches    Current antibiotics: Ceftaroline 8/2>>8/3 Cefazolin 8/3>>   MEDICATIONS: . aspirin EC  81 mg Oral BH-q7a  . ceFTAROline (TEFLARO) IV  600 mg Intravenous Q12H  . enoxaparin (LOVENOX) injection  40 mg Subcutaneous QHS  . famotidine  20 mg Oral BID  . ferrous sulfate  325 mg Oral TID WC  . gabapentin  300 mg Oral TID  . hydrALAZINE  25 mg Oral 3 times per day  . insulin aspart  0-15 Units Subcutaneous TID WC  . insulin aspart  0-5 Units Subcutaneous QHS  . insulin aspart protamine- aspart  25 Units Subcutaneous BID WC  . meloxicam  7.5 mg Oral BID  . mirtazapine  15 mg Oral QHS  . oxybutynin  10 mg Oral QHS  . polyethylene glycol  17 g Oral Daily  . sodium chloride  3 mL Intravenous Q12H  . tamsulosin  0.4 mg Oral QPC supper    History  Substance Use Topics  .  Smoking status: Current Every Day Smoker -- 0.12 packs/day for 45 years    Types: Cigarettes  . Smokeless tobacco: Never Used  . Alcohol Use: Yes     Comment: 04/26/2015 "I may drink 1/2 glass of wine or a couple beers 1-2 times/month; if that"    Family History  Problem Relation Age of Onset  . Diabetes Mellitus II Mother   . Diabetes Mellitus II Father   . Diabetes Mellitus II Brother     Review of Systems - General: Denies fever, chills, night sweats, changes in weight, changes in appetite HEENT: Denies headaches, ear pain, changes in vision, rhinorrhea, sore throat CV: Denies CP, palpitations, SOB, orthopnea Pulm: Denies SOB, cough, wheezing GI: Denies abdominal pain, nausea, vomiting, diarrhea, constipation, melena, hematochezia GU: Denies dysuria, hematuria, frequency Msk: Denies muscle cramps, joint  pains Neuro: Denies weakness, numbness, tingling Skin: Denies rashes   OBJECTIVE: Temp:  [98.1 F (36.7 C)-98.2 F (36.8 C)] 98.2 F (36.8 C) (08/03 0500) Pulse Rate:  [71-72] 71 (08/03 0500) BP: (142-147)/(65-69) 147/65 mmHg (08/03 0500) SpO2:  [98 %-100 %] 98 % (08/03 0500) Weight:  [220 lb (99.791 kg)] 220 lb (99.791 kg) (08/02 2018)  General: sitting up in bed, NAD, pleasant  HEENT: [place]/AT, EOMI, sclera anicteric, mucus membranes moist CV: RRR, no m/g/r Pulm: CTA bilaterally, breaths non-labored Abd: BS+, soft, non-tender Ext: There is a chronic ulcer with granulation tissue present on the medial right leg. There are also ulcers on the medial/posterior left leg and lateral left leg. Minimal drainage. Please see pictures in Dr. Lucianne Lei Dam's note.  Neuro: alert and oriented x 3    LABS: Results for orders placed or performed during the hospital encounter of 04/25/15 (from the past 48 hour(s))  Glucose, capillary     Status: Abnormal   Collection Time: 04/25/15  9:54 PM  Result Value Ref Range   Glucose-Capillary 164 (H) 65 - 99 mg/dL  CBC with Differential     Status: Abnormal   Collection Time: 04/25/15  9:58 PM  Result Value Ref Range   WBC 7.8 4.0 - 10.5 K/uL   RBC 4.33 4.22 - 5.81 MIL/uL   Hemoglobin 8.4 (L) 13.0 - 17.0 g/dL   HCT 27.8 (L) 39.0 - 52.0 %   MCV 64.2 (L) 78.0 - 100.0 fL   MCH 19.4 (L) 26.0 - 34.0 pg   MCHC 30.2 30.0 - 36.0 g/dL   RDW 18.2 (H) 11.5 - 15.5 %   Platelets 321 150 - 400 K/uL   Neutrophils Relative % 55 43 - 77 %   Lymphocytes Relative 32 12 - 46 %   Monocytes Relative 7 3 - 12 %   Eosinophils Relative 5 0 - 5 %   Basophils Relative 1 0 - 1 %   Neutro Abs 4.3 1.7 - 7.7 K/uL   Lymphs Abs 2.5 0.7 - 4.0 K/uL   Monocytes Absolute 0.5 0.1 - 1.0 K/uL   Eosinophils Absolute 0.4 0.0 - 0.7 K/uL   Basophils Absolute 0.1 0.0 - 0.1 K/uL   RBC Morphology POLYCHROMASIA PRESENT   Sedimentation rate     Status: Abnormal   Collection Time: 04/25/15   9:58 PM  Result Value Ref Range   Sed Rate 77 (H) 0 - 16 mm/hr  C-reactive protein     Status: Abnormal   Collection Time: 04/25/15  9:58 PM  Result Value Ref Range   CRP 4.3 (H) <1.0 mg/dL  Prealbumin     Status: Abnormal  Collection Time: 04/25/15  9:58 PM  Result Value Ref Range   Prealbumin 13.7 (L) 18 - 38 mg/dL  CK     Status: None   Collection Time: 04/25/15  9:58 PM  Result Value Ref Range   Total CK 59 49 - 397 U/L  Comprehensive metabolic panel     Status: Abnormal   Collection Time: 04/25/15  9:58 PM  Result Value Ref Range   Sodium 139 135 - 145 mmol/L   Potassium 4.2 3.5 - 5.1 mmol/L   Chloride 105 101 - 111 mmol/L   CO2 26 22 - 32 mmol/L   Glucose, Bld 164 (H) 65 - 99 mg/dL   BUN 13 6 - 20 mg/dL   Creatinine, Ser 0.65 0.61 - 1.24 mg/dL   Calcium 9.1 8.9 - 10.3 mg/dL   Total Protein 7.2 6.5 - 8.1 g/dL   Albumin 2.8 (L) 3.5 - 5.0 g/dL   AST 25 15 - 41 U/L   ALT 43 17 - 63 U/L   Alkaline Phosphatase 199 (H) 38 - 126 U/L   Total Bilirubin 0.3 0.3 - 1.2 mg/dL   GFR calc non Af Amer >60 >60 mL/min   GFR calc Af Amer >60 >60 mL/min    Comment: (NOTE) The eGFR has been calculated using the CKD EPI equation. This calculation has not been validated in all clinical situations. eGFR's persistently <60 mL/min signify possible Chronic Kidney Disease.    Anion gap 8 5 - 15  Glucose, capillary     Status: Abnormal   Collection Time: 04/26/15  7:29 AM  Result Value Ref Range   Glucose-Capillary 245 (H) 65 - 99 mg/dL  CBC with Differential/Platelet     Status: Abnormal   Collection Time: 04/26/15  8:16 AM  Result Value Ref Range   WBC 5.6 4.0 - 10.5 K/uL   RBC 4.02 (L) 4.22 - 5.81 MIL/uL   Hemoglobin 7.8 (L) 13.0 - 17.0 g/dL   HCT 25.5 (L) 39.0 - 52.0 %   MCV 63.4 (L) 78.0 - 100.0 fL   MCH 19.4 (L) 26.0 - 34.0 pg   MCHC 30.6 30.0 - 36.0 g/dL   RDW 18.0 (H) 11.5 - 15.5 %   Platelets 299 150 - 400 K/uL   Neutrophils Relative % 52 43 - 77 %   Lymphocytes Relative  36 12 - 46 %   Monocytes Relative 5 3 - 12 %   Eosinophils Relative 6 (H) 0 - 5 %   Basophils Relative 1 0 - 1 %   Neutro Abs 2.9 1.7 - 7.7 K/uL   Lymphs Abs 2.0 0.7 - 4.0 K/uL   Monocytes Absolute 0.3 0.1 - 1.0 K/uL   Eosinophils Absolute 0.3 0.0 - 0.7 K/uL   Basophils Absolute 0.1 0.0 - 0.1 K/uL   RBC Morphology POLYCHROMASIA PRESENT   Comprehensive metabolic panel     Status: Abnormal   Collection Time: 04/26/15  8:16 AM  Result Value Ref Range   Sodium 137 135 - 145 mmol/L   Potassium 4.3 3.5 - 5.1 mmol/L   Chloride 106 101 - 111 mmol/L   CO2 25 22 - 32 mmol/L   Glucose, Bld 257 (H) 65 - 99 mg/dL   BUN 12 6 - 20 mg/dL   Creatinine, Ser 0.63 0.61 - 1.24 mg/dL   Calcium 8.7 (L) 8.9 - 10.3 mg/dL   Total Protein 6.7 6.5 - 8.1 g/dL   Albumin 2.6 (L) 3.5 - 5.0 g/dL   AST 22  15 - 41 U/L   ALT 39 17 - 63 U/L   Alkaline Phosphatase 183 (H) 38 - 126 U/L   Total Bilirubin 0.3 0.3 - 1.2 mg/dL   GFR calc non Af Amer >60 >60 mL/min   GFR calc Af Amer >60 >60 mL/min    Comment: (NOTE) The eGFR has been calculated using the CKD EPI equation. This calculation has not been validated in all clinical situations. eGFR's persistently <60 mL/min signify possible Chronic Kidney Disease.    Anion gap 6 5 - 15    MICRO: Blood cx 8/3>>   IMAGING: X-ray left tib/fib 8/3>> X-ray right tib/fib 8/3>>   Assessment/Plan:    Mr. Petion is a 59yo man with hx of uncontrolled diabetes, chronic non-healing wounds on his RLE, and recent hospitalization for complicated left lower extremity cellulitis presenting with new left lower extremity wounds/cellulitis.  Cellulitis of LLE/ Chronic Venous Ulcers: Cellulitis appears to be mild. Wounds not draining significant amount of pus. Patient was started on Ceftaroline empirically. I think he can be de-escalated to Cefazolin and discharged on Keflex. Will get bilateral tib/fib x-rays to do initial assessment for osteo. May want to consider MRI of lower  extremities if x-rays negative. His prealbumin is low at  13.7. Nutrition will be important for him as well as better diabetes control and smoking cessation. Agree with getting ABIs to assess for PAD.  Recommend - Switch from Ceftaroline to Cefazolin - Discharge on Keflex for additional 7 days  - Bilateral tib/fib x-rays ordered - f/u ABIs - f/u blood cx - Glucerna TID - Blood sugar control - Smoking cessation counseling  Attending note to follow.   Albin Felling, MD, MPH Internal Medicine Resident, PGY-II Pager: [phone]

## 2015-04-26 NOTE — Progress Notes (Signed)
Orthopedic Tech Progress Note Patient Details:  [name] May 23, 1956 [id number]  Ortho Devices Type of Ortho Device: Haematologist Ortho Device/Splint Location: (B) LE Ortho Device/Splint Interventions: Ordered, Application   [name] 04/26/2015, 3:59 PM

## 2015-04-26 NOTE — Progress Notes (Signed)
Utilization review completed.  

## 2015-04-26 NOTE — Progress Notes (Signed)
Orthopedic Tech Progress Note Patient Details:  [name] [birth year] [id number]  Ortho Devices Type of Ortho Device: Louretta Parma boot Ortho Device/Splint Location: bilateral Ortho Device/Splint Interventions: Application   Caylyn Tedeschi 04/26/2015, 1:06 PM

## 2015-04-27 ENCOUNTER — Inpatient Hospital Stay (HOSPITAL_COMMUNITY): Payer: Non-veteran care

## 2015-04-27 DIAGNOSIS — L97909 Non-pressure chronic ulcer of unspecified part of unspecified lower leg with unspecified severity: Secondary | ICD-10-CM

## 2015-04-27 LAB — CBC
HEMATOCRIT: 26.7 % — AB (ref 39.0–52.0)
HEMOGLOBIN: 8.1 g/dL — AB (ref 13.0–17.0)
MCH: 19.7 pg — ABNORMAL LOW (ref 26.0–34.0)
MCHC: 30.3 g/dL (ref 30.0–36.0)
MCV: 64.8 fL — AB (ref 78.0–100.0)
PLATELETS: 304 10*3/uL (ref 150–400)
RBC: 4.12 MIL/uL — AB (ref 4.22–5.81)
RDW: 18.2 % — AB (ref 11.5–15.5)
WBC: 5 10*3/uL (ref 4.0–10.5)

## 2015-04-27 LAB — COMPREHENSIVE METABOLIC PANEL
ALT: 35 U/L (ref 17–63)
AST: 22 U/L (ref 15–41)
Albumin: 2.4 g/dL — ABNORMAL LOW (ref 3.5–5.0)
Alkaline Phosphatase: 182 U/L — ABNORMAL HIGH (ref 38–126)
Anion gap: 7 (ref 5–15)
BUN: 10 mg/dL (ref 6–20)
CO2: 28 mmol/L (ref 22–32)
Calcium: 8.7 mg/dL — ABNORMAL LOW (ref 8.9–10.3)
Chloride: 103 mmol/L (ref 101–111)
Creatinine, Ser: 0.62 mg/dL (ref 0.61–1.24)
GFR calc Af Amer: >60 mL/min (ref >60)
GFR calc non Af Amer: >60 mL/min (ref >60)
GLUCOSE: 172 mg/dL — AB (ref 65–99)
POTASSIUM: 3.8 mmol/L (ref 3.5–5.1)
Sodium: 138 mmol/L (ref 135–145)
TOTAL PROTEIN: 6.9 g/dL (ref 6.5–8.1)
Total Bilirubin: 0.3 mg/dL (ref 0.3–1.2)

## 2015-04-27 LAB — C-REACTIVE PROTEIN: CRP: 4.2 mg/dL — ABNORMAL HIGH (ref <1.0)

## 2015-04-27 LAB — GLUCOSE, CAPILLARY
GLUCOSE-CAPILLARY: 222 mg/dL — AB (ref 65–99)
GLUCOSE-CAPILLARY: 213 mg/dL — AB (ref 65–99)

## 2015-04-27 LAB — HIV ANTIBODY (ROUTINE TESTING W REFLEX): HIV Screen 4th Generation wRfx: NONREACTIVE

## 2015-04-27 LAB — SEDIMENTATION RATE: SED RATE: 70 mm/h — AB (ref 0–16)

## 2015-04-27 MED ORDER — CEPHALEXIN 500 MG PO CAPS: 500.0000 mg | ORAL_CAPSULE | Freq: Four times a day (QID) | ORAL | Status: AC

## 2015-04-27 MED ORDER — OXYCODONE-ACETAMINOPHEN 5-325 MG PO TABS: 1.0000 | ORAL_TABLET | Freq: Three times a day (TID) | ORAL | Status: DC | PRN

## 2015-04-27 NOTE — Progress Notes (Signed)
[name] for Infectious Disease    Date of Admission:  04/25/2015   Total days of antibiotics 3       Ceftaroline 8/2>8/3       Cefazolin 8/3>  Subjective: Patient reports left leg pain is controlled with medication. No other complaints. Has remained afebrile and no leukocytosis.   Medications:  . aspirin EC  81 mg Oral BH-q7a  .  ceFAZolin (ANCEF) IV  1 g Intravenous 3 times per day  . enoxaparin (LOVENOX) injection  40 mg Subcutaneous QHS  . famotidine  20 mg Oral BID  . feeding supplement (GLUCERNA SHAKE)  237 mL Oral TID BM  . ferrous sulfate  325 mg Oral TID WC  . gabapentin  300 mg Oral TID  . hydrALAZINE  25 mg Oral 3 times per day  . insulin aspart  0-15 Units Subcutaneous TID WC  . insulin aspart  0-5 Units Subcutaneous QHS  . insulin aspart protamine- aspart  25 Units Subcutaneous BID WC  . meloxicam  7.5 mg Oral BID  . mirtazapine  15 mg Oral QHS  . multivitamin with minerals  1 tablet Oral Daily  . nutrition supplement (JUVEN)  1 packet Oral BID BM  . oxybutynin  10 mg Oral QHS  . polyethylene glycol  17 g Oral Daily  . sodium chloride  3 mL Intravenous Q12H  . tamsulosin  0.4 mg Oral QPC supper    Objective: Vital signs in last 24 hours: Temp:  [98.2 F (36.8 C)-98.4 F (36.9 C)] 98.2 F (36.8 C) (08/04 0500) Pulse Rate:  [59-82] 59 (08/04 0500) BP: (130-137)/(69-77) 130/75 mmHg (08/04 0500) SpO2:  [96 %-100 %] 96 % (08/04 0500) Weight:  [220 lb 3.2 oz (99.882 kg)] 220 lb 3.2 oz (99.882 kg) (08/04 0500)  General: sitting up in bed, NAD HEENT: [name]/AT, EOMI, sclera anicteric, mucus membranes moist CV: RRR, no m/g/r Pulm: CTA bilaterally, breaths non-labored Abd: BS+, soft, non-tender Ext: both legs wrapped in unna boots Neuro: alert and oriented x 3  Lab Results  Recent Labs  04/26/15 0816 04/27/15 0318  WBC 5.6 5.0  HGB 7.8* 8.1*  HCT 25.5* 26.7*  NA 137 138  K 4.3 3.8  CL 106 103  CO2 25 28  BUN 12 10  CREATININE 0.63 0.62   Liver  Panel  Recent Labs  04/26/15 0816 04/27/15 0318  PROT 6.7 6.9  ALBUMIN 2.6* 2.4*  AST 22 22  ALT 39 35  ALKPHOS 183* 182*  BILITOT 0.3 0.3   Sedimentation Rate  Recent Labs  04/27/15 0318  ESRSEDRATE 70*   C-Reactive Protein  Recent Labs  04/25/15 2158 04/27/15 0318  CRP 4.3* 4.2*    Microbiology:  Studies/Results: Dg Tibia/fibula Left  04/26/2015   CLINICAL DATA:  Lower extremity ulcer.  Diabetes.  EXAM: LEFT TIBIA AND FIBULA - 2 VIEW  COMPARISON:  None.  FINDINGS: There is no evidence of fracture or other focal bone lesions. No evidence of osteolysis or periostitis. Peripheral vascular calcification noted .  IMPRESSION: No radiographic evidence of osteomyelitis or other acute findings.   Electronically Signed   By: Earle Gell M.D.   On: 04/26/2015 17:33   Dg Tibia/fibula Right  04/26/2015   CLINICAL DATA:  Bilateral leg ulcers.  Assess for osteomyelitis.  EXAM: RIGHT TIBIA AND FIBULA - 2 VIEW  COMPARISON:  03/14/2015  FINDINGS: Examination demonstrates an overlying bandage. Underlying bony structures are within normal without evidence of bone destruction to suggest  osteomyelitis.  IMPRESSION: No acute findings.   Electronically Signed   By: Marin Olp M.D.   On: 04/26/2015 17:32     Assessment/Plan: [name] is a 59yo man with hx of uncontrolled diabetes, chronic non-healing wounds on his RLE, and recent hospitalization for complicated left lower extremity cellulitis presenting with new left lower extremity wounds/cellulitis.  Non-purulent Cellulitis of LLE/ Chronic Venous Ulcers: Cellulitis is mild and non-purulent. Patient afebrile and no leukocytosis. Bilateral tib/fib x-rays without evidence of osteomyelitis. I do not believe he needs MRI of his left leg. Suspect this is all result of his poorly controlled diabetes, smoking, and peripheral vascular disease. Risk factor modification important.  Recommend: - Continue Cefazolin - Discharge on Keflex for additional  5 days - Glucerna TID - Blood sugar control - Smoking cessation counseling  Attending note to follow.    Albin Felling, MD, MPH Internal Medicine Resident, PGY-II Pager: [phone] 04/27/2015, 9:53 AM

## 2015-04-27 NOTE — Progress Notes (Addendum)
Nutrition Follow-up  DOCUMENTATION CODES:   Not applicable  INTERVENTION:   -Continue Glucerna Shake po TID, each supplement provides 220 kcal and 10 grams of protein -Continue MVI daily -Continue Juven BID  NUTRITION DIAGNOSIS:   Increased nutrient needs related to wound healing as evidenced by estimated needs.  Ongoing  GOAL:   Patient will meet greater than or equal to 90% of their needs  Progressing  MONITOR:   PO intake, Supplement acceptance, Labs, Weight trends, Skin, I & O's  REASON FOR ASSESSMENT:   Consult Wound healing  ASSESSMENT:   [name] is a 59 y.o. male with Past medical history of diabetes mellitus, uncontrolled, GERD, hypertension, chronic bilateral hip arthritis. Admitted with nonhealing ulcer on lt leg.  Pt seen yesterday; refer to note on 04/26/15 for initial evaluation.   Pt reports that his appetite is returning. He consumed 100% of his breakfast this morning. He reveals that his appetite was decreased over the past 2-3 weeks, due to illness and recent hospitalization. He has been consuming his Glucerna Shakes and enjoys them; he requests that these be continued after discharge. Noted order was increased to TID by MD.  Pt reports UBW is 230-235#. He estimates he "lost a little weight" due to recent hospitalization.   Pt reports he tries to keep his blood sugars under control, but acknowledges inconsistencies in CBGS over the past few months. He reveals usual Hgb A1c is usually between 7-7.5 and expressed concern of most recent Hgb A1c of 9.8 (taken on 02/2015). He was able to verbalize understanding of diabetes self-management, including diet. Suspect elevations in glucose elevations may be due to acute illness.   Nutrition-Focused physical exam completed. Findings are no fat depletion, no muscle depletion, and no edema.   Discussed importance of continued good PO intake to promote healing. Encouraged pt to continue supplements.   Diet Order:   Diet Carb Modified Fluid consistency:: Thin; Room service appropriate?: Yes  Skin:  Wound (see comment) (stage II PU sacrum, bilateral leg DM ulcers)  Last BM:  04/26/15  Height:   Ht Readings from Last 1 Encounters:  04/25/15 6' 4.25" (1.937 m)    Weight:   Wt Readings from Last 1 Encounters:  04/27/15 220 lb 3.2 oz (99.882 kg)    Ideal Body Weight:  94.1 kg  BMI:  Body mass index is 26.62 kg/(m^2).  Estimated Nutritional Needs:   Kcal:  4431-5400  Protein:  130-145 grams  Fluid:  2.3- 2.5 L  EDUCATION NEEDS:   No education needs identified at this time  Altheia Shafran A. Jimmye Norman, RD, LDN, CDE Pager: [phone] After hours Pager: [phone]

## 2015-04-27 NOTE — Progress Notes (Signed)
Orthopedic Tech Progress Note Patient Details:  [name] 1956/09/18 [id number]  Ortho Devices Type of Ortho Device: Haematologist Ortho Device/Splint Location: (B) LE Ortho Device/Splint Interventions: Application   Cammer, Theodoro Parma 04/27/2015, 1:23 PM

## 2015-04-27 NOTE — Progress Notes (Signed)
Pt d/c to [facility].  Pt in no acute distress.  Assisted pt to dress in paper gown.  Una boots were applied by ortho tech and foam dsg changed to leg ulcers (total of 3) by RN.

## 2015-04-27 NOTE — Discharge Summary (Signed)
Physician Discharge Summary  [name] MRN: [id number] DOB/AGE: Aug 30, 1956 59 y.o.  PCP: Charlsie Merles, MD   Admit date: 04/25/2015 Discharge date: 04/27/2015  Discharge Diagnoses:     Principal Problem:   Cellulitis and abscess of leg Active Problems:   DM (diabetes mellitus), secondary, uncontrolled, with peripheral vascular complications   Anemia, iron deficiency   Hypertension   Degenerative arthritis of hip   Leg ulcer   Nonhealing ulcer of left lower extremity   Pain of left leg    Follow-up recommendations Follow-up with PCP in 3-5 days , including although additional recommended appointments as below Follow-up CBC, CMP in 3-5 days  Follow-up with primary care provider and infectious disease As per wound care instructions, patient need to have sustained compression therapy in order to support healing of his venous stasis ulcers. When he returns to the SNF, MD can you please request that the SNF continue 1-2 x wk Unna's boots until the ulcers have resolved and the patient can then utilize compression stockings    Medication List    STOP taking these medications        amoxicillin-clavulanate 875-125 MG per tablet  Commonly known as:  AUGMENTIN     ibuprofen 800 MG tablet  Commonly known as:  ADVIL,MOTRIN      TAKE these medications        acetaminophen 325 MG tablet  Commonly known as:  TYLENOL  Take 2 tablets (650 mg total) by mouth every 6 (six) hours as needed for mild pain (or Fever >/= 101).     aspirin EC 81 MG tablet  Take 81 mg by mouth every morning.     atorvastatin 80 MG tablet  Commonly known as:  LIPITOR  Take 80 mg by mouth daily.     cephALEXin 500 MG capsule  Commonly known as:  KEFLEX  Take 1 capsule (500 mg total) by mouth 4 (four) times daily.     docusate sodium 100 MG capsule  Commonly known as:  COLACE  Take 1 capsule (100 mg total) by mouth 2 (two) times daily as needed for mild constipation.     enoxaparin 150 MG/ML  injection  Commonly known as:  LOVENOX  Inject 0.36 mLs (55 mg total) into the skin daily.     famotidine 20 MG tablet  Commonly known as:  PEPCID  Take 1 tablet (20 mg total) by mouth 2 (two) times daily.     ferrous sulfate 325 (65 FE) MG tablet  Take 325 mg by mouth 3 (three) times daily with meals.     gabapentin 300 MG capsule  Commonly known as:  NEURONTIN  Take 300 mg by mouth 3 (three) times daily.     hydrALAZINE 25 MG tablet  Commonly known as:  APRESOLINE  Take 1 tablet (25 mg total) by mouth every 8 (eight) hours.     hydrocerin Crea  Apply 1 application topically 2 (two) times daily.     insulin aspart 100 UNIT/ML injection  Commonly known as:  novoLOG  Inject 0-9 Units into the skin 3 (three) times daily with meals.     insulin aspart protamine- aspart (70-30) 100 UNIT/ML injection  Commonly known as:  NOVOLOG MIX 70/30  Inject 0.25 mLs (25 Units total) into the skin 2 (two) times daily with a meal.     lisinopril 10 MG tablet  Commonly known as:  PRINIVIL,ZESTRIL  Take 1 tablet (10 mg total) by mouth daily.  meloxicam 7.5 MG tablet  Commonly known as:  MOBIC  Take 7.5 mg by mouth 2 (two) times daily.     mirtazapine 15 MG tablet  Commonly known as:  REMERON  Take 15 mg by mouth at bedtime.     multivitamin with minerals Tabs tablet  Take 1 tablet by mouth daily.     ondansetron 4 MG tablet  Commonly known as:  ZOFRAN  Take 1 tablet (4 mg total) by mouth every 6 (six) hours as needed for nausea.     oxybutynin 10 MG 24 hr tablet  Commonly known as:  DITROPAN-XL  Take 10 mg by mouth at bedtime.     oxyCODONE-acetaminophen 5-325 MG per tablet  Commonly known as:  PERCOCET/ROXICET  Take 1 tablet by mouth every 8 (eight) hours as needed for moderate pain or severe pain.     polyethylene glycol packet  Commonly known as:  MIRALAX / GLYCOLAX  Take 17 g by mouth daily as needed for mild constipation.     protein supplement Powd  Commonly known  as:  UNJURY CHOCOLATE CLASSIC  Take 7 g (2 oz total) by mouth 3 (three) times daily.     tamsulosin 0.4 MG Caps capsule  Commonly known as:  FLOMAX  Take 0.4 mg by mouth daily after supper.     TRIXAICIN 0.025 % cream  Generic drug:  capsicum oleoresin  Apply 1 application topically 2 (two) times daily.         Discharge Condition: Stable    Disposition: 03-Skilled Nursing Facility   Consults:  Infectious disease     Significant Diagnostic Studies:  Dg Tibia/fibula Left  04/26/2015   CLINICAL DATA:  Lower extremity ulcer.  Diabetes.  EXAM: LEFT TIBIA AND FIBULA - 2 VIEW  COMPARISON:  None.  FINDINGS: There is no evidence of fracture or other focal bone lesions. No evidence of osteolysis or periostitis. Peripheral vascular calcification noted .  IMPRESSION: No radiographic evidence of osteomyelitis or other acute findings.   Electronically Signed   By: Earle Gell M.D.   On: 04/26/2015 17:33   Dg Tibia/fibula Right  04/26/2015   CLINICAL DATA:  Bilateral leg ulcers.  Assess for osteomyelitis.  EXAM: RIGHT TIBIA AND FIBULA - 2 VIEW  COMPARISON:  03/14/2015  FINDINGS: Examination demonstrates an overlying bandage. Underlying bony structures are within normal without evidence of bone destruction to suggest osteomyelitis.  IMPRESSION: No acute findings.   Electronically Signed   By: Marin Olp M.D.   On: 04/26/2015 17:32        Filed Weights   04/25/15 2018 04/27/15 0500  Weight: 99.791 kg (220 lb) 99.882 kg (220 lb 3.2 oz)     Microbiology: No results found for this or any previous visit (from the past 240 hour(s)).     Blood Culture    Component Value Date/Time   SDES LEG LEFT 03/28/2015 1400   SPECREQUEST Normal 03/28/2015 1400   CULT  03/28/2015 1400    MULTIPLE ORGANISMS PRESENT, NONE PREDOMINANT Note: NO STAPHYLOCOCCUS AUREUS ISOLATED NO GROUP A STREP (S.PYOGENES) ISOLATED Performed at [place] 03/31/2015 FINAL 03/28/2015 1400       Labs: Results for orders placed or performed during the hospital encounter of 04/25/15 (from the past 48 hour(s))  Glucose, capillary     Status: Abnormal   Collection Time: 04/25/15  9:54 PM  Result Value Ref Range   Glucose-Capillary 164 (H) 65 - 99 mg/dL  CBC  with Differential     Status: Abnormal   Collection Time: 04/25/15  9:58 PM  Result Value Ref Range   WBC 7.8 4.0 - 10.5 K/uL   RBC 4.33 4.22 - 5.81 MIL/uL   Hemoglobin 8.4 (L) 13.0 - 17.0 g/dL   HCT 27.8 (L) 39.0 - 52.0 %   MCV 64.2 (L) 78.0 - 100.0 fL   MCH 19.4 (L) 26.0 - 34.0 pg   MCHC 30.2 30.0 - 36.0 g/dL   RDW 18.2 (H) 11.5 - 15.5 %   Platelets 321 150 - 400 K/uL   Neutrophils Relative % 55 43 - 77 %   Lymphocytes Relative 32 12 - 46 %   Monocytes Relative 7 3 - 12 %   Eosinophils Relative 5 0 - 5 %   Basophils Relative 1 0 - 1 %   Neutro Abs 4.3 1.7 - 7.7 K/uL   Lymphs Abs 2.5 0.7 - 4.0 K/uL   Monocytes Absolute 0.5 0.1 - 1.0 K/uL   Eosinophils Absolute 0.4 0.0 - 0.7 K/uL   Basophils Absolute 0.1 0.0 - 0.1 K/uL   RBC Morphology POLYCHROMASIA PRESENT   Sedimentation rate     Status: Abnormal   Collection Time: 04/25/15  9:58 PM  Result Value Ref Range   Sed Rate 77 (H) 0 - 16 mm/hr  C-reactive protein     Status: Abnormal   Collection Time: 04/25/15  9:58 PM  Result Value Ref Range   CRP 4.3 (H) <1.0 mg/dL  Prealbumin     Status: Abnormal   Collection Time: 04/25/15  9:58 PM  Result Value Ref Range   Prealbumin 13.7 (L) 18 - 38 mg/dL  CK     Status: None   Collection Time: 04/25/15  9:58 PM  Result Value Ref Range   Total CK 59 49 - 397 U/L  Comprehensive metabolic panel     Status: Abnormal   Collection Time: 04/25/15  9:58 PM  Result Value Ref Range   Sodium 139 135 - 145 mmol/L   Potassium 4.2 3.5 - 5.1 mmol/L   Chloride 105 101 - 111 mmol/L   CO2 26 22 - 32 mmol/L   Glucose, Bld 164 (H) 65 - 99 mg/dL   BUN 13 6 - 20 mg/dL   Creatinine, Ser 0.65 0.61 - 1.24 mg/dL   Calcium 9.1 8.9 -  10.3 mg/dL   Total Protein 7.2 6.5 - 8.1 g/dL   Albumin 2.8 (L) 3.5 - 5.0 g/dL   AST 25 15 - 41 U/L   ALT 43 17 - 63 U/L   Alkaline Phosphatase 199 (H) 38 - 126 U/L   Total Bilirubin 0.3 0.3 - 1.2 mg/dL   GFR calc non Af Amer >60 >60 mL/min   GFR calc Af Amer >60 >60 mL/min    Comment: (NOTE) The eGFR has been calculated using the CKD EPI equation. This calculation has not been validated in all clinical situations. eGFR's persistently <60 mL/min signify possible Chronic Kidney Disease.    Anion gap 8 5 - 15  Glucose, capillary     Status: Abnormal   Collection Time: 04/26/15  7:29 AM  Result Value Ref Range   Glucose-Capillary 245 (H) 65 - 99 mg/dL  CBC with Differential/Platelet     Status: Abnormal   Collection Time: 04/26/15  8:16 AM  Result Value Ref Range   WBC 5.6 4.0 - 10.5 K/uL   RBC 4.02 (L) 4.22 - 5.81 MIL/uL   Hemoglobin 7.8 (L) 13.0 -  17.0 g/dL   HCT 25.5 (L) 39.0 - 52.0 %   MCV 63.4 (L) 78.0 - 100.0 fL   MCH 19.4 (L) 26.0 - 34.0 pg   MCHC 30.6 30.0 - 36.0 g/dL   RDW 18.0 (H) 11.5 - 15.5 %   Platelets 299 150 - 400 K/uL   Neutrophils Relative % 52 43 - 77 %   Lymphocytes Relative 36 12 - 46 %   Monocytes Relative 5 3 - 12 %   Eosinophils Relative 6 (H) 0 - 5 %   Basophils Relative 1 0 - 1 %   Neutro Abs 2.9 1.7 - 7.7 K/uL   Lymphs Abs 2.0 0.7 - 4.0 K/uL   Monocytes Absolute 0.3 0.1 - 1.0 K/uL   Eosinophils Absolute 0.3 0.0 - 0.7 K/uL   Basophils Absolute 0.1 0.0 - 0.1 K/uL   RBC Morphology POLYCHROMASIA PRESENT   Comprehensive metabolic panel     Status: Abnormal   Collection Time: 04/26/15  8:16 AM  Result Value Ref Range   Sodium 137 135 - 145 mmol/L   Potassium 4.3 3.5 - 5.1 mmol/L   Chloride 106 101 - 111 mmol/L   CO2 25 22 - 32 mmol/L   Glucose, Bld 257 (H) 65 - 99 mg/dL   BUN 12 6 - 20 mg/dL   Creatinine, Ser 0.63 0.61 - 1.24 mg/dL   Calcium 8.7 (L) 8.9 - 10.3 mg/dL   Total Protein 6.7 6.5 - 8.1 g/dL   Albumin 2.6 (L) 3.5 - 5.0 g/dL   AST 22  15 - 41 U/L   ALT 39 17 - 63 U/L   Alkaline Phosphatase 183 (H) 38 - 126 U/L   Total Bilirubin 0.3 0.3 - 1.2 mg/dL   GFR calc non Af Amer >60 >60 mL/min   GFR calc Af Amer >60 >60 mL/min    Comment: (NOTE) The eGFR has been calculated using the CKD EPI equation. This calculation has not been validated in all clinical situations. eGFR's persistently <60 mL/min signify possible Chronic Kidney Disease.    Anion gap 6 5 - 15  Glucose, capillary     Status: Abnormal   Collection Time: 04/26/15 11:23 AM  Result Value Ref Range   Glucose-Capillary 130 (H) 65 - 99 mg/dL  Glucose, capillary     Status: Abnormal   Collection Time: 04/26/15  1:35 PM  Result Value Ref Range   Glucose-Capillary 134 (H) 65 - 99 mg/dL  Glucose, capillary     Status: Abnormal   Collection Time: 04/26/15  4:01 PM  Result Value Ref Range   Glucose-Capillary 152 (H) 65 - 99 mg/dL  Glucose, capillary     Status: Abnormal   Collection Time: 04/26/15  8:53 PM  Result Value Ref Range   Glucose-Capillary 104 (H) 65 - 99 mg/dL  CBC     Status: Abnormal   Collection Time: 04/27/15  3:18 AM  Result Value Ref Range   WBC 5.0 4.0 - 10.5 K/uL   RBC 4.12 (L) 4.22 - 5.81 MIL/uL   Hemoglobin 8.1 (L) 13.0 - 17.0 g/dL   HCT 26.7 (L) 39.0 - 52.0 %   MCV 64.8 (L) 78.0 - 100.0 fL   MCH 19.7 (L) 26.0 - 34.0 pg   MCHC 30.3 30.0 - 36.0 g/dL   RDW 18.2 (H) 11.5 - 15.5 %   Platelets 304 150 - 400 K/uL  Comprehensive metabolic panel     Status: Abnormal   Collection Time: 04/27/15  3:18 AM  Result Value Ref Range   Sodium 138 135 - 145 mmol/L   Potassium 3.8 3.5 - 5.1 mmol/L   Chloride 103 101 - 111 mmol/L   CO2 28 22 - 32 mmol/L   Glucose, Bld 172 (H) 65 - 99 mg/dL   BUN 10 6 - 20 mg/dL   Creatinine, Ser 0.62 0.61 - 1.24 mg/dL   Calcium 8.7 (L) 8.9 - 10.3 mg/dL   Total Protein 6.9 6.5 - 8.1 g/dL   Albumin 2.4 (L) 3.5 - 5.0 g/dL   AST 22 15 - 41 U/L   ALT 35 17 - 63 U/L   Alkaline Phosphatase 182 (H) 38 - 126 U/L    Total Bilirubin 0.3 0.3 - 1.2 mg/dL   GFR calc non Af Amer >60 >60 mL/min   GFR calc Af Amer >60 >60 mL/min    Comment: (NOTE) The eGFR has been calculated using the CKD EPI equation. This calculation has not been validated in all clinical situations. eGFR's persistently <60 mL/min signify possible Chronic Kidney Disease.    Anion gap 7 5 - 15  Sedimentation rate     Status: Abnormal   Collection Time: 04/27/15  3:18 AM  Result Value Ref Range   Sed Rate 70 (H) 0 - 16 mm/hr  C-reactive protein     Status: Abnormal   Collection Time: 04/27/15  3:18 AM  Result Value Ref Range   CRP 4.2 (H) <1.0 mg/dL  Glucose, capillary     Status: Abnormal   Collection Time: 04/27/15  7:17 AM  Result Value Ref Range   Glucose-Capillary 222 (H) 65 - 99 mg/dL  Glucose, capillary     Status: Abnormal   Collection Time: 04/27/15 11:44 AM  Result Value Ref Range   Glucose-Capillary 213 (H) 65 - 99 mg/dL     Lipid Panel  No results found for: CHOL, TRIG, HDL, CHOLHDL, VLDL, LDLCALC, LDLDIRECT   Lab Results  Component Value Date   HGBA1C 9.8* 03/16/2015     Lab Results  Component Value Date   CREATININE 0.62 04/27/2015     HPI :[name] is a 59 y.o. man with PMHx of HTN, chronic hip pain, and Type 2 DM who presented on 8/2 with a nonhealing ulcer of the left lower extremity. Patient was recently hospitalized from 3/5-5/97 for a complicated cellulitis of his left lower extremity. He completed 9 days of Daptomycin and Imipenem and was then discharged on Augmentin to complete a 7 day course (end date 7/18).   Patient notes he was discharged to [facility] and had been doing well with his physical therapy. He was receiving daily dressing changes at the facility. He notes he started to have left leg pain on 8/30 and a nurse noted blisters and weeping on a new area of his leg. This new wound was dressed and patient was taken to the wound care center for further evaluation a  few days later. When the wound was undressed at the wound clinic there was noted to be a significant amount of pus and bleeding. He denies any fevers, chills, nausea, or vomiting. H  e notes his last HbA1c was elevated at 9.8. He also reports smoking history and is a current smoker (2-3 cigarettes per day). He denies any history of PAD, but does report having ABIs performed before at another hospital.   HOSPITAL COURSE:   Cellulitis and abscess of the leg initially started on IV Ceftarolin empirically. Does not appear to be  septic. The patient did not follow-up with orthopedics as recommended. Patient is currently hemodynamically stable. Patient has a history of chronic venous insufficiency. He also is a smoker. Ankle-brachial index study showed  ABI  right 1.33  left 1.15      tight glucose control Infectious disease recommended continue by mouth Keflex outpatient for 7 days Smoking cessation counseling done Wound care recommends sustained compression therapy in order to support healing of his venous stasis ulcers.SNF continue 1-2 x wk Unna's boots until the ulcers have resolved and the patient can then utilize compression stockings.     Uncontrolled type 2 diabetes mellitus with peripheral vascular disease -Blood glucose overall stable on 70/30 insulin, 25 units twice a day A1C results (9.8% on 03/16/2015) and discussed basic home care, importance of checking CBGs and maintaining good CBG control to prevent long-term and short-term complications  Essential hypertension -Continued lisinopril and hydralazine -BP stable and controlled  Microcytic anemia, baseline 8.0 - 8.5 -Iron panel suggests iron deficiency. -On ferrous sulfate  Depression -continued remeron.   Discharge Exam:    Blood pressure 130/75, pulse 59, temperature 98.2 F (36.8 C), temperature source Oral, height 6' 4.25" (1.937 m), weight 99.882 kg (220 lb 3.2 oz), SpO2 96 %. General: sitting up in bed, NAD,  pleasant  HEENT: [place]/AT, EOMI, sclera anicteric, mucus membranes moist CV: RRR, no m/g/r Pulm: CTA bilaterally, breaths non-labored Abd: BS+, soft, non-tender Ext: There is a chronic ulcer with granulation tissue present on the medial right leg. There are also ulcers on the medial/posterior left leg and lateral left leg. Minimal drainage. Please see pictures in Dr. Lucianne Lei Dam's note.  Neuro: alert and oriented x 3          Discharge Instructions    Diet - low sodium heart healthy    Complete by:  As directed      Increase activity slowly    Complete by:  As directed            Follow-up Information    Follow up with Charlsie Merles, MD. Schedule an appointment as soon as possible for a visit in 3 days.   Specialty:  Internal Medicine   Contact information:   [place] [place] [address] [phone]       Follow up with Alcide Evener, MD. Schedule an appointment as soon as possible for a visit in 1 week.   Specialty:  Infectious Diseases   Contact information:   [address] [place] [address] [phone]       Signed: Reyne Dumas 04/27/2015, 12:46 PM        Time spent >45 mins

## 2015-04-27 NOTE — Clinical Social Work Note (Signed)
Clinical Social Work Assessment  Patient Details  Name: [name] MRN: [id number] Date of Birth: 1956/05/01  Date of referral:  04/27/15               Reason for consult:  Facility Placement                Permission sought to share information with:  Chartered certified accountant granted to share information::  Yes, Verbal Permission Granted  Name::        Agency::  [facility]  Relationship::     Contact Information:     Housing/Transportation Living arrangements for the past 2 months:  [place] of Information:  Patient Patient Interpreter Needed:  None Criminal Activity/Legal Involvement Pertinent to Current Situation/Hospitalization:  No - Comment as needed Significant Relationships:  Spouse, Other Family Members Lives with:  Facility Resident Do you feel safe going back to the place where you live?  Yes Need for family participation in patient care:  No (Coment)  Care giving concerns:  CSW notified pt admitted from facility   Social Worker assessment / plan:  CSW visited pt room and pt confirmed he was admitted from [facility]. Pt is at facility for ST rehab and plans to return at dc to complete rehab/treatment. Pt notified CSW he will need non-emergent ambulance transport back.  Employment status:    Forensic scientist:  [employer] PT Recommendations:  Not assessed at this time Information / Referral to community resources:   (None needed at this time)  Patient/Family's Response to care:  Pt agreeable to return to SNF at [facility]  Patient/Family's Understanding of and Emotional Response to Diagnosis, Current Treatment, and Prognosis:  Pt has a good understanding of his condition and demonstrated this by discussing current treatment plans with CSW. Pt with appropriate emotional response and is happy to be discharging soon.  Emotional Assessment Appearance:  Appears stated age, Well-Groomed Attitude/Demeanor/Rapport:  Other  (Cooperative) Affect (typically observed):  Calm, Appropriate, Pleasant Orientation:  Oriented to Self, Oriented to Place, Oriented to  Time, Oriented to Situation Alcohol / Substance use:  Not Applicable Psych involvement (Current and /or in the community):  No (Comment)  Discharge Needs  Concerns to be addressed:  Discharge Planning Concerns Readmission within the last 30 days:  No Current discharge risk:  Dependent with Mobility Barriers to Discharge:  Continued Medical Work up  [employer], [place]

## 2015-04-27 NOTE — Progress Notes (Signed)
VASCULAR LAB PRELIMINARY  ARTERIAL  ABI completed:    RIGHT    LEFT    PRESSURE WAVEFORM  PRESSURE WAVEFORM  BRACHIAL 132 Triphasic BRACHIAL 119 Triphasic  DP 175 Triphasic DP 135 Triphasic     PT 152 Triphasic  PER 167 Triphasic       RIGHT LEFT  ABI 1.33 1.15   ABIs and Doppler waveforms of the arteries evaluated are within normal limits bilaterally at rest. The right posterior tibial artery was barely audible and could not be fully evaluated. This may be due to the location of the ulcer on the right ankle and the condition of the skin on the side of the foot.  Philana Younis, RVS 04/27/2015, 12:04 PM

## 2015-04-27 NOTE — Progress Notes (Signed)
CSW (Clinical Education officer, museum) prepared pt dc packet and placed with shadow chart. CSW arranged non-emergent ambulance transport. Pt pt nurse, and facility informed. Pt to notify family. CSW signing off.  [place], [place]

## 2015-04-28 LAB — HCV COMMENT:

## 2015-04-28 LAB — HEMOGLOBIN A1C
Hgb A1c MFr Bld: 9 % — ABNORMAL HIGH (ref 4.8–5.6)
Mean Plasma Glucose: 212 mg/dL

## 2015-04-28 LAB — HEPATITIS C ANTIBODY (REFLEX): HCV AB: 0.1 {s_co_ratio} (ref 0.0–0.9)

## 2015-05-01 LAB — CULTURE, BLOOD (ROUTINE X 2)
CULTURE: NO GROWTH
CULTURE: NO GROWTH

## 2015-07-07 ENCOUNTER — Encounter (HOSPITAL_BASED_OUTPATIENT_CLINIC_OR_DEPARTMENT_OTHER): Payer: Medicare Other | Attending: Internal Medicine

## 2015-07-07 DIAGNOSIS — L97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ed: Secondary | ICD-10-CM | POA: Diagnosis not present

## 2015-07-07 DIAGNOSIS — I83028 Varicose veins of left lower extremity with ulcer other part of lower leg: Secondary | ICD-10-CM | POA: Diagnosis not present

## 2015-07-07 DIAGNOSIS — I10 Essential (primary) hypertension: Secondary | ICD-10-CM | POA: Diagnosis not present

## 2015-07-07 DIAGNOSIS — E1151 Type 2 diabetes mellitus with diabetic peripheral angiopathy without gangrene: Secondary | ICD-10-CM | POA: Insufficient documentation

## 2015-07-07 DIAGNOSIS — G4730 Sleep apnea, unspecified: Secondary | ICD-10-CM | POA: Insufficient documentation

## 2015-07-07 DIAGNOSIS — K219 Gastro-esophageal reflux disease without esophagitis: Secondary | ICD-10-CM | POA: Insufficient documentation

## 2015-07-07 DIAGNOSIS — M1990 Unspecified osteoarthritis, unspecified site: Secondary | ICD-10-CM | POA: Insufficient documentation

## 2015-07-07 DIAGNOSIS — Z794 Long term (current) use of insulin: Secondary | ICD-10-CM | POA: Insufficient documentation

## 2015-07-07 DIAGNOSIS — E11621 Type 2 diabetes mellitus with foot ulcer: Secondary | ICD-10-CM | POA: Insufficient documentation

## 2015-07-07 DIAGNOSIS — L97812 Non-pressure chronic ulcer of other part of right lower leg with fat layer exposed: Secondary | ICD-10-CM | POA: Insufficient documentation

## 2015-07-07 DIAGNOSIS — I83018 Varicose veins of right lower extremity with ulcer other part of lower leg: Secondary | ICD-10-CM | POA: Diagnosis not present

## 2015-07-07 DIAGNOSIS — E1140 Type 2 diabetes mellitus with diabetic neuropathy, unspecified: Secondary | ICD-10-CM | POA: Insufficient documentation

## 2015-07-07 DIAGNOSIS — F17210 Nicotine dependence, cigarettes, uncomplicated: Secondary | ICD-10-CM | POA: Diagnosis not present

## 2015-07-07 DIAGNOSIS — L97511 Non-pressure chronic ulcer of other part of right foot limited to breakdown of skin: Secondary | ICD-10-CM | POA: Insufficient documentation

## 2015-07-17 ENCOUNTER — Encounter (HOSPITAL_BASED_OUTPATIENT_CLINIC_OR_DEPARTMENT_OTHER): Payer: Non-veteran care

## 2015-07-21 DIAGNOSIS — L97812 Non-pressure chronic ulcer of other part of right lower leg with fat layer exposed: Secondary | ICD-10-CM | POA: Diagnosis not present

## 2015-07-21 DIAGNOSIS — I83018 Varicose veins of right lower extremity with ulcer other part of lower leg: Secondary | ICD-10-CM | POA: Diagnosis not present

## 2015-07-21 DIAGNOSIS — L97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ed: Secondary | ICD-10-CM | POA: Diagnosis not present

## 2015-07-21 DIAGNOSIS — I83028 Varicose veins of left lower extremity with ulcer other part of lower leg: Secondary | ICD-10-CM | POA: Diagnosis not present

## 2015-08-04 ENCOUNTER — Encounter (HOSPITAL_BASED_OUTPATIENT_CLINIC_OR_DEPARTMENT_OTHER): Payer: Medicare Other | Attending: Internal Medicine

## 2015-08-04 DIAGNOSIS — E11621 Type 2 diabetes mellitus with foot ulcer: Secondary | ICD-10-CM | POA: Diagnosis not present

## 2015-08-04 DIAGNOSIS — L03119 Cellulitis of unspecified part of limb: Secondary | ICD-10-CM | POA: Diagnosis not present

## 2015-08-04 DIAGNOSIS — L97812 Non-pressure chronic ulcer of other part of right lower leg with fat layer exposed: Secondary | ICD-10-CM | POA: Diagnosis not present

## 2015-08-04 DIAGNOSIS — G4730 Sleep apnea, unspecified: Secondary | ICD-10-CM | POA: Diagnosis not present

## 2015-08-04 DIAGNOSIS — I10 Essential (primary) hypertension: Secondary | ICD-10-CM | POA: Diagnosis not present

## 2015-08-04 DIAGNOSIS — D649 Anemia, unspecified: Secondary | ICD-10-CM | POA: Diagnosis not present

## 2015-08-04 DIAGNOSIS — I872 Venous insufficiency (chronic) (peripheral): Secondary | ICD-10-CM | POA: Diagnosis not present

## 2015-08-04 DIAGNOSIS — L97511 Non-pressure chronic ulcer of other part of right foot limited to breakdown of skin: Secondary | ICD-10-CM | POA: Diagnosis not present

## 2015-08-04 DIAGNOSIS — E1140 Type 2 diabetes mellitus with diabetic neuropathy, unspecified: Secondary | ICD-10-CM | POA: Diagnosis not present

## 2015-08-04 DIAGNOSIS — L97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ed: Secondary | ICD-10-CM | POA: Diagnosis not present

## 2015-08-04 DIAGNOSIS — M1990 Unspecified osteoarthritis, unspecified site: Secondary | ICD-10-CM | POA: Diagnosis not present

## 2015-08-04 DIAGNOSIS — I87311 Chronic venous hypertension (idiopathic) with ulcer of right lower extremity: Secondary | ICD-10-CM | POA: Insufficient documentation

## 2015-08-25 ENCOUNTER — Encounter (HOSPITAL_BASED_OUTPATIENT_CLINIC_OR_DEPARTMENT_OTHER): Payer: Medicare Other | Attending: Internal Medicine

## 2015-08-25 DIAGNOSIS — E11621 Type 2 diabetes mellitus with foot ulcer: Secondary | ICD-10-CM | POA: Diagnosis present

## 2015-08-25 DIAGNOSIS — L97812 Non-pressure chronic ulcer of other part of right lower leg with fat layer exposed: Secondary | ICD-10-CM | POA: Diagnosis not present

## 2015-08-25 DIAGNOSIS — E1140 Type 2 diabetes mellitus with diabetic neuropathy, unspecified: Secondary | ICD-10-CM | POA: Diagnosis not present

## 2015-08-25 DIAGNOSIS — L97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ed: Secondary | ICD-10-CM | POA: Insufficient documentation

## 2015-08-25 DIAGNOSIS — I10 Essential (primary) hypertension: Secondary | ICD-10-CM | POA: Diagnosis not present

## 2015-08-25 DIAGNOSIS — E1151 Type 2 diabetes mellitus with diabetic peripheral angiopathy without gangrene: Secondary | ICD-10-CM | POA: Diagnosis not present

## 2015-08-25 DIAGNOSIS — L97511 Non-pressure chronic ulcer of other part of right foot limited to breakdown of skin: Secondary | ICD-10-CM | POA: Insufficient documentation

## 2015-08-25 DIAGNOSIS — L03119 Cellulitis of unspecified part of limb: Secondary | ICD-10-CM | POA: Insufficient documentation

## 2015-08-25 DIAGNOSIS — G4730 Sleep apnea, unspecified: Secondary | ICD-10-CM | POA: Diagnosis not present

## 2015-08-25 DIAGNOSIS — L97823 Non-pressure chronic ulcer of other part of left lower leg with necrosis of muscle: Secondary | ICD-10-CM | POA: Diagnosis not present

## 2015-08-25 DIAGNOSIS — M1990 Unspecified osteoarthritis, unspecified site: Secondary | ICD-10-CM | POA: Diagnosis not present

## 2015-08-25 DIAGNOSIS — I87331 Chronic venous hypertension (idiopathic) with ulcer and inflammation of right lower extremity: Secondary | ICD-10-CM | POA: Diagnosis not present

## 2015-09-29 ENCOUNTER — Encounter (HOSPITAL_BASED_OUTPATIENT_CLINIC_OR_DEPARTMENT_OTHER): Payer: Medicare Other | Attending: Internal Medicine

## 2015-09-29 DIAGNOSIS — L03119 Cellulitis of unspecified part of limb: Secondary | ICD-10-CM | POA: Insufficient documentation

## 2015-09-29 DIAGNOSIS — L97511 Non-pressure chronic ulcer of other part of right foot limited to breakdown of skin: Secondary | ICD-10-CM | POA: Insufficient documentation

## 2015-09-29 DIAGNOSIS — E1140 Type 2 diabetes mellitus with diabetic neuropathy, unspecified: Secondary | ICD-10-CM | POA: Diagnosis not present

## 2015-09-29 DIAGNOSIS — I872 Venous insufficiency (chronic) (peripheral): Secondary | ICD-10-CM | POA: Diagnosis not present

## 2015-09-29 DIAGNOSIS — E1151 Type 2 diabetes mellitus with diabetic peripheral angiopathy without gangrene: Secondary | ICD-10-CM | POA: Insufficient documentation

## 2015-09-29 DIAGNOSIS — L97812 Non-pressure chronic ulcer of other part of right lower leg with fat layer exposed: Secondary | ICD-10-CM | POA: Insufficient documentation

## 2015-09-29 DIAGNOSIS — G4730 Sleep apnea, unspecified: Secondary | ICD-10-CM | POA: Diagnosis not present

## 2015-09-29 DIAGNOSIS — M1990 Unspecified osteoarthritis, unspecified site: Secondary | ICD-10-CM | POA: Insufficient documentation

## 2015-09-29 DIAGNOSIS — E11621 Type 2 diabetes mellitus with foot ulcer: Secondary | ICD-10-CM | POA: Insufficient documentation

## 2015-09-29 DIAGNOSIS — I87311 Chronic venous hypertension (idiopathic) with ulcer of right lower extremity: Secondary | ICD-10-CM | POA: Diagnosis present

## 2015-09-29 DIAGNOSIS — L97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ed: Secondary | ICD-10-CM | POA: Insufficient documentation

## 2015-10-13 DIAGNOSIS — I87311 Chronic venous hypertension (idiopathic) with ulcer of right lower extremity: Secondary | ICD-10-CM | POA: Diagnosis not present

## 2015-10-20 DIAGNOSIS — I87311 Chronic venous hypertension (idiopathic) with ulcer of right lower extremity: Secondary | ICD-10-CM | POA: Diagnosis not present

## 2015-10-27 ENCOUNTER — Encounter (HOSPITAL_BASED_OUTPATIENT_CLINIC_OR_DEPARTMENT_OTHER): Payer: Medicare Other | Attending: Internal Medicine

## 2015-10-27 DIAGNOSIS — L97812 Non-pressure chronic ulcer of other part of right lower leg with fat layer exposed: Secondary | ICD-10-CM | POA: Insufficient documentation

## 2015-10-27 DIAGNOSIS — L97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ed: Secondary | ICD-10-CM | POA: Diagnosis present

## 2015-10-27 DIAGNOSIS — E11622 Type 2 diabetes mellitus with other skin ulcer: Secondary | ICD-10-CM | POA: Diagnosis not present

## 2015-10-27 DIAGNOSIS — I10 Essential (primary) hypertension: Secondary | ICD-10-CM | POA: Diagnosis not present

## 2015-10-27 DIAGNOSIS — E1140 Type 2 diabetes mellitus with diabetic neuropathy, unspecified: Secondary | ICD-10-CM | POA: Insufficient documentation

## 2015-10-27 DIAGNOSIS — M1990 Unspecified osteoarthritis, unspecified site: Secondary | ICD-10-CM | POA: Insufficient documentation

## 2015-10-27 DIAGNOSIS — L97821 Non-pressure chronic ulcer of other part of left lower leg limited to breakdown of skin: Secondary | ICD-10-CM | POA: Diagnosis not present

## 2015-10-27 DIAGNOSIS — I872 Venous insufficiency (chronic) (peripheral): Secondary | ICD-10-CM | POA: Insufficient documentation

## 2015-10-27 DIAGNOSIS — G4730 Sleep apnea, unspecified: Secondary | ICD-10-CM | POA: Diagnosis not present

## 2015-10-27 LAB — GLUCOSE, CAPILLARY: Glucose-Capillary: 165 mg/dL — ABNORMAL HIGH (ref 65–99)

## 2015-10-30 ENCOUNTER — Encounter (HOSPITAL_BASED_OUTPATIENT_CLINIC_OR_DEPARTMENT_OTHER): Payer: Non-veteran care

## 2015-11-02 ENCOUNTER — Inpatient Hospital Stay (HOSPITAL_COMMUNITY)
Admission: EM | Admit: 2015-11-02 | Discharge: 2015-11-13 | DRG: 872 | Disposition: A | Discharge status: Skilled Nursing Facility | Payer: Medicare Other | Attending: Internal Medicine | Admitting: Internal Medicine

## 2015-11-02 ENCOUNTER — Emergency Department (HOSPITAL_COMMUNITY): Payer: Medicare Other

## 2015-11-02 ENCOUNTER — Inpatient Hospital Stay (HOSPITAL_COMMUNITY): Payer: Medicare Other

## 2015-11-02 ENCOUNTER — Encounter (HOSPITAL_COMMUNITY): Payer: Self-pay

## 2015-11-02 DIAGNOSIS — F17210 Nicotine dependence, cigarettes, uncomplicated: Secondary | ICD-10-CM | POA: Diagnosis present

## 2015-11-02 DIAGNOSIS — L97829 Non-pressure chronic ulcer of other part of left lower leg with unspecified severity: Secondary | ICD-10-CM | POA: Diagnosis present

## 2015-11-02 DIAGNOSIS — K219 Gastro-esophageal reflux disease without esophagitis: Secondary | ICD-10-CM | POA: Diagnosis present

## 2015-11-02 DIAGNOSIS — D509 Iron deficiency anemia, unspecified: Secondary | ICD-10-CM | POA: Diagnosis present

## 2015-11-02 DIAGNOSIS — D62 Acute posthemorrhagic anemia: Secondary | ICD-10-CM | POA: Diagnosis present

## 2015-11-02 DIAGNOSIS — Z833 Family history of diabetes mellitus: Secondary | ICD-10-CM | POA: Diagnosis not present

## 2015-11-02 DIAGNOSIS — E1165 Type 2 diabetes mellitus with hyperglycemia: Secondary | ICD-10-CM | POA: Diagnosis not present

## 2015-11-02 DIAGNOSIS — G4733 Obstructive sleep apnea (adult) (pediatric): Secondary | ICD-10-CM | POA: Diagnosis present

## 2015-11-02 DIAGNOSIS — E119 Type 2 diabetes mellitus without complications: Secondary | ICD-10-CM | POA: Diagnosis present

## 2015-11-02 DIAGNOSIS — Z531 Procedure and treatment not carried out because of patient's decision for reasons of belief and group pressure: Secondary | ICD-10-CM | POA: Diagnosis present

## 2015-11-02 DIAGNOSIS — E1351 Other specified diabetes mellitus with diabetic peripheral angiopathy without gangrene: Secondary | ICD-10-CM

## 2015-11-02 DIAGNOSIS — Z7901 Long term (current) use of anticoagulants: Secondary | ICD-10-CM

## 2015-11-02 DIAGNOSIS — L97919 Non-pressure chronic ulcer of unspecified part of right lower leg with unspecified severity: Secondary | ICD-10-CM | POA: Diagnosis not present

## 2015-11-02 DIAGNOSIS — E872 Acidosis: Secondary | ICD-10-CM | POA: Diagnosis present

## 2015-11-02 DIAGNOSIS — R509 Fever, unspecified: Secondary | ICD-10-CM | POA: Diagnosis present

## 2015-11-02 DIAGNOSIS — L97909 Non-pressure chronic ulcer of unspecified part of unspecified lower leg with unspecified severity: Secondary | ICD-10-CM | POA: Diagnosis present

## 2015-11-02 DIAGNOSIS — L97819 Non-pressure chronic ulcer of other part of right lower leg with unspecified severity: Secondary | ICD-10-CM | POA: Diagnosis present

## 2015-11-02 DIAGNOSIS — E785 Hyperlipidemia, unspecified: Secondary | ICD-10-CM | POA: Diagnosis present

## 2015-11-02 DIAGNOSIS — E162 Hypoglycemia, unspecified: Secondary | ICD-10-CM | POA: Diagnosis present

## 2015-11-02 DIAGNOSIS — E1365 Other specified diabetes mellitus with hyperglycemia: Secondary | ICD-10-CM | POA: Diagnosis not present

## 2015-11-02 DIAGNOSIS — L03116 Cellulitis of left lower limb: Secondary | ICD-10-CM | POA: Diagnosis present

## 2015-11-02 DIAGNOSIS — I10 Essential (primary) hypertension: Secondary | ICD-10-CM | POA: Diagnosis present

## 2015-11-02 DIAGNOSIS — N179 Acute kidney failure, unspecified: Secondary | ICD-10-CM | POA: Diagnosis present

## 2015-11-02 DIAGNOSIS — A419 Sepsis, unspecified organism: Principal | ICD-10-CM | POA: Diagnosis present

## 2015-11-02 DIAGNOSIS — Z79899 Other long term (current) drug therapy: Secondary | ICD-10-CM | POA: Diagnosis not present

## 2015-11-02 DIAGNOSIS — Z791 Long term (current) use of non-steroidal anti-inflammatories (NSAID): Secondary | ICD-10-CM

## 2015-11-02 DIAGNOSIS — L0390 Cellulitis, unspecified: Secondary | ICD-10-CM | POA: Insufficient documentation

## 2015-11-02 DIAGNOSIS — Z221 Carrier of other intestinal infectious diseases: Secondary | ICD-10-CM

## 2015-11-02 DIAGNOSIS — L03115 Cellulitis of right lower limb: Secondary | ICD-10-CM | POA: Diagnosis present

## 2015-11-02 DIAGNOSIS — M7989 Other specified soft tissue disorders: Secondary | ICD-10-CM | POA: Diagnosis not present

## 2015-11-02 DIAGNOSIS — R197 Diarrhea, unspecified: Secondary | ICD-10-CM | POA: Diagnosis present

## 2015-11-02 DIAGNOSIS — E11649 Type 2 diabetes mellitus with hypoglycemia without coma: Secondary | ICD-10-CM | POA: Diagnosis present

## 2015-11-02 DIAGNOSIS — L97929 Non-pressure chronic ulcer of unspecified part of left lower leg with unspecified severity: Secondary | ICD-10-CM | POA: Diagnosis not present

## 2015-11-02 DIAGNOSIS — Z7982 Long term (current) use of aspirin: Secondary | ICD-10-CM | POA: Diagnosis not present

## 2015-11-02 DIAGNOSIS — E860 Dehydration: Secondary | ICD-10-CM | POA: Diagnosis present

## 2015-11-02 DIAGNOSIS — F329 Major depressive disorder, single episode, unspecified: Secondary | ICD-10-CM | POA: Diagnosis present

## 2015-11-02 DIAGNOSIS — D563 Thalassemia minor: Secondary | ICD-10-CM | POA: Diagnosis present

## 2015-11-02 DIAGNOSIS — Z794 Long term (current) use of insulin: Secondary | ICD-10-CM | POA: Diagnosis not present

## 2015-11-02 DIAGNOSIS — L03119 Cellulitis of unspecified part of limb: Secondary | ICD-10-CM | POA: Diagnosis present

## 2015-11-02 DIAGNOSIS — I83209 Varicose veins of unspecified lower extremity with both ulcer of unspecified site and inflammation: Secondary | ICD-10-CM | POA: Diagnosis not present

## 2015-11-02 DIAGNOSIS — I83208 Varicose veins of unspecified lower extremity with both ulcer of other part of lower extremity and inflammation: Secondary | ICD-10-CM | POA: Diagnosis present

## 2015-11-02 DIAGNOSIS — E11622 Type 2 diabetes mellitus with other skin ulcer: Secondary | ICD-10-CM | POA: Diagnosis present

## 2015-11-02 DIAGNOSIS — I959 Hypotension, unspecified: Secondary | ICD-10-CM | POA: Diagnosis present

## 2015-11-02 DIAGNOSIS — E875 Hyperkalemia: Secondary | ICD-10-CM | POA: Diagnosis present

## 2015-11-02 DIAGNOSIS — E1151 Type 2 diabetes mellitus with diabetic peripheral angiopathy without gangrene: Secondary | ICD-10-CM | POA: Diagnosis present

## 2015-11-02 LAB — URINE MICROSCOPIC-ADD ON

## 2015-11-02 LAB — COMPREHENSIVE METABOLIC PANEL
ALT: 42 U/L (ref 17–63)
AST: 35 U/L (ref 15–41)
Albumin: 2.5 g/dL — ABNORMAL LOW (ref 3.5–5.0)
Alkaline Phosphatase: 179 U/L — ABNORMAL HIGH (ref 38–126)
Anion gap: 13 (ref 5–15)
BUN: 56 mg/dL — AB (ref 6–20)
CHLORIDE: 107 mmol/L (ref 101–111)
CO2: 20 mmol/L — AB (ref 22–32)
CREATININE: 2.23 mg/dL — AB (ref 0.61–1.24)
Calcium: 8.9 mg/dL (ref 8.9–10.3)
GFR calc Af Amer: 35 mL/min — ABNORMAL LOW (ref >60)
GFR, EST NON AFRICAN AMERICAN: 30 mL/min — AB (ref >60)
GLUCOSE: 63 mg/dL — AB (ref 65–99)
Potassium: 6.4 mmol/L (ref 3.5–5.1)
SODIUM: 140 mmol/L (ref 135–145)
Total Bilirubin: 0.6 mg/dL (ref 0.3–1.2)
Total Protein: 7.3 g/dL (ref 6.5–8.1)

## 2015-11-02 LAB — URINALYSIS, ROUTINE W REFLEX MICROSCOPIC
BILIRUBIN URINE: NEGATIVE
GLUCOSE, UA: NEGATIVE mg/dL
Hgb urine dipstick: NEGATIVE
KETONES UR: NEGATIVE mg/dL
Nitrite: NEGATIVE
PH: 5 (ref 5.0–8.0)
Protein, ur: NEGATIVE mg/dL
SPECIFIC GRAVITY, URINE: 1.021 (ref 1.005–1.030)

## 2015-11-02 LAB — CBC WITH DIFFERENTIAL/PLATELET
Basophils Absolute: 0 10*3/uL (ref 0.0–0.1)
Basophils Relative: 0 %
EOS PCT: 1 %
Eosinophils Absolute: 0.2 10*3/uL (ref 0.0–0.7)
HEMATOCRIT: 23 % — AB (ref 39.0–52.0)
Hemoglobin: 7.1 g/dL — ABNORMAL LOW (ref 13.0–17.0)
LYMPHS ABS: 1.6 10*3/uL (ref 0.7–4.0)
Lymphocytes Relative: 8 %
MCH: 19 pg — ABNORMAL LOW (ref 26.0–34.0)
MCHC: 30.9 g/dL (ref 30.0–36.0)
MCV: 61.5 fL — AB (ref 78.0–100.0)
MONOS PCT: 4 %
Monocytes Absolute: 0.8 10*3/uL (ref 0.1–1.0)
NEUTROS ABS: 17.5 10*3/uL — AB (ref 1.7–7.7)
Neutrophils Relative %: 87 %
Platelets: 309 10*3/uL (ref 150–400)
RBC: 3.74 MIL/uL — ABNORMAL LOW (ref 4.22–5.81)
RDW: 19.6 % — AB (ref 11.5–15.5)
WBC: 20.1 10*3/uL — ABNORMAL HIGH (ref 4.0–10.5)

## 2015-11-02 LAB — I-STAT VENOUS BLOOD GAS, ED
ACID-BASE DEFICIT: 6 mmol/L — AB (ref 0.0–2.0)
BICARBONATE: 19.7 meq/L — AB (ref 20.0–24.0)
O2 Saturation: 50 %
PCO2 VEN: 37.8 mmHg — AB (ref 45.0–50.0)
PH VEN: 7.325 — AB (ref 7.250–7.300)
PO2 VEN: 29 mmHg — AB (ref 30.0–45.0)
TCO2: 21 mmol/L (ref 0–100)

## 2015-11-02 LAB — POTASSIUM: Potassium: 6.5 mmol/L (ref 3.5–5.1)

## 2015-11-02 LAB — I-STAT CG4 LACTIC ACID, ED: Lactic Acid, Venous: 1.84 mmol/L (ref 0.5–2.0)

## 2015-11-02 LAB — TROPONIN I: Troponin I: 0.04 ng/mL — ABNORMAL HIGH (ref <0.031)

## 2015-11-02 LAB — APTT: APTT: 35 s (ref 24–37)

## 2015-11-02 LAB — POC OCCULT BLOOD, ED: Fecal Occult Bld: NEGATIVE

## 2015-11-02 LAB — C-REACTIVE PROTEIN: CRP: 31.8 mg/dL — AB (ref <1.0)

## 2015-11-02 LAB — PROTIME-INR
INR: 1.32 (ref 0.00–1.49)
Prothrombin Time: 16.6 seconds — ABNORMAL HIGH (ref 11.6–15.2)

## 2015-11-02 LAB — PROCALCITONIN: PROCALCITONIN: 6.61 ng/mL

## 2015-11-02 LAB — CBG MONITORING, ED
GLUCOSE-CAPILLARY: 262 mg/dL — AB (ref 65–99)
GLUCOSE-CAPILLARY: 38 mg/dL — AB (ref 65–99)
GLUCOSE-CAPILLARY: 79 mg/dL (ref 65–99)

## 2015-11-02 LAB — LACTIC ACID, PLASMA: LACTIC ACID, VENOUS: 1.7 mmol/L (ref 0.5–2.0)

## 2015-11-02 MED ORDER — SODIUM CHLORIDE 0.9 % IV BOLUS (SEPSIS)
1000.0000 mL | Freq: Once | INTRAVENOUS | Status: AC
  Administered 2015-11-02: 1000 mL via INTRAVENOUS

## 2015-11-02 MED ORDER — PREGABALIN 75 MG PO CAPS
75.0000 mg | ORAL_CAPSULE | Freq: Two times a day (BID) | ORAL | Status: DC
  Administered 2015-11-03 – 2015-11-13 (×22): 75 mg via ORAL
  Filled 2015-11-02: qty 3
  Filled 2015-11-02 (×3): qty 1
  Filled 2015-11-02: qty 3
  Filled 2015-11-02: qty 1
  Filled 2015-11-02: qty 3
  Filled 2015-11-02: qty 1
  Filled 2015-11-02: qty 3
  Filled 2015-11-02 (×6): qty 1
  Filled 2015-11-02: qty 3
  Filled 2015-11-02 (×6): qty 1

## 2015-11-02 MED ORDER — ACETAMINOPHEN 325 MG PO TABS
650.0000 mg | ORAL_TABLET | Freq: Four times a day (QID) | ORAL | Status: DC | PRN
Start: 2015-11-02 — End: 2015-11-13
  Administered 2015-11-03 – 2015-11-11 (×13): 650 mg via ORAL
  Filled 2015-11-02 (×13): qty 2

## 2015-11-02 MED ORDER — SODIUM POLYSTYRENE SULFONATE 15 GM/60ML PO SUSP
30.0000 g | Freq: Once | ORAL | Status: AC
  Administered 2015-11-03: 30 g via ORAL
  Filled 2015-11-02: qty 120

## 2015-11-02 MED ORDER — SODIUM CHLORIDE 0.9 % IV BOLUS (SEPSIS)
1500.0000 mL | INTRAVENOUS | Status: AC
  Administered 2015-11-03: 1000 mL via INTRAVENOUS
  Administered 2015-11-03: 500 mL via INTRAVENOUS

## 2015-11-02 MED ORDER — SODIUM CHLORIDE 0.9 % IV SOLN: INTRAVENOUS | Status: DC

## 2015-11-02 MED ORDER — POLYETHYLENE GLYCOL 3350 17 G PO PACK: 17.0000 g | PACK | Freq: Every day | ORAL | Status: DC | PRN

## 2015-11-02 MED ORDER — SODIUM BICARBONATE 8.4 % IV SOLN
50.0000 meq | Freq: Once | INTRAVENOUS | Status: AC
  Administered 2015-11-03: 50 meq via INTRAVENOUS
  Filled 2015-11-02: qty 50

## 2015-11-02 MED ORDER — OXYCODONE HCL ER 10 MG PO T12A
10.0000 mg | EXTENDED_RELEASE_TABLET | Freq: Two times a day (BID) | ORAL | Status: DC
  Administered 2015-11-03 – 2015-11-04 (×4): 10 mg via ORAL
  Filled 2015-11-02 (×4): qty 1

## 2015-11-02 MED ORDER — ONDANSETRON HCL 4 MG PO TABS
4.0000 mg | ORAL_TABLET | Freq: Four times a day (QID) | ORAL | Status: DC | PRN
Start: 2015-11-02 — End: 2015-11-13

## 2015-11-02 MED ORDER — SODIUM CHLORIDE 0.9% FLUSH
3.0000 mL | Freq: Two times a day (BID) | INTRAVENOUS | Status: DC
  Administered 2015-11-03 – 2015-11-13 (×10): 3 mL via INTRAVENOUS

## 2015-11-02 MED ORDER — DEXTROSE 50 % IV SOLN
50.0000 mL | Freq: Once | INTRAVENOUS | Status: AC
  Administered 2015-11-02: 50 mL via INTRAVENOUS
  Filled 2015-11-02: qty 50

## 2015-11-02 MED ORDER — INSULIN ASPART 100 UNIT/ML ~~LOC~~ SOLN
0.0000 [IU] | Freq: Three times a day (TID) | SUBCUTANEOUS | Status: DC
  Administered 2015-11-04: 2 [IU] via SUBCUTANEOUS
  Administered 2015-11-04: 3 [IU] via SUBCUTANEOUS

## 2015-11-02 MED ORDER — INSULIN ASPART PROT & ASPART (70-30 MIX) 100 UNIT/ML ~~LOC~~ SUSP: 20.0000 [IU] | Freq: Two times a day (BID) | SUBCUTANEOUS | Status: DC

## 2015-11-02 MED ORDER — SODIUM POLYSTYRENE SULFONATE 15 GM/60ML PO SUSP: 30.0000 g | Freq: Once | ORAL | Status: DC

## 2015-11-02 MED ORDER — ENOXAPARIN SODIUM 40 MG/0.4ML ~~LOC~~ SOLN: 40.0000 mg | SUBCUTANEOUS | Status: DC

## 2015-11-02 MED ORDER — MIRTAZAPINE 15 MG PO TABS
15.0000 mg | ORAL_TABLET | Freq: Every day | ORAL | Status: DC
  Administered 2015-11-03 – 2015-11-12 (×11): 15 mg via ORAL
  Filled 2015-11-02 (×11): qty 1

## 2015-11-02 MED ORDER — OXYBUTYNIN CHLORIDE ER 10 MG PO TB24
10.0000 mg | ORAL_TABLET | Freq: Every day | ORAL | Status: DC
  Administered 2015-11-03 – 2015-11-12 (×11): 10 mg via ORAL
  Filled 2015-11-02 (×12): qty 1

## 2015-11-02 MED ORDER — PRO-STAT SUGAR FREE PO LIQD
30.0000 mL | Freq: Three times a day (TID) | ORAL | Status: DC
  Administered 2015-11-04 – 2015-11-13 (×28): 30 mL via ORAL
  Filled 2015-11-02 (×28): qty 30

## 2015-11-02 MED ORDER — VANCOMYCIN HCL IN DEXTROSE 1-5 GM/200ML-% IV SOLN: 1000.0000 mg | Freq: Once | INTRAVENOUS | Status: DC

## 2015-11-02 MED ORDER — FERROUS SULFATE 325 (65 FE) MG PO TABS
325.0000 mg | ORAL_TABLET | Freq: Three times a day (TID) | ORAL | Status: DC
  Administered 2015-11-03 – 2015-11-13 (×32): 325 mg via ORAL
  Filled 2015-11-02 (×32): qty 1

## 2015-11-02 MED ORDER — VANCOMYCIN HCL 10 G IV SOLR
1500.0000 mg | Freq: Once | INTRAVENOUS | Status: AC
  Administered 2015-11-02: 1500 mg via INTRAVENOUS
  Filled 2015-11-02: qty 1500

## 2015-11-02 MED ORDER — ADULT MULTIVITAMIN W/MINERALS CH
1.0000 | ORAL_TABLET | Freq: Every day | ORAL | Status: DC
  Administered 2015-11-03 – 2015-11-13 (×11): 1 via ORAL
  Filled 2015-11-02 (×11): qty 1

## 2015-11-02 MED ORDER — ENSURE ENLIVE PO LIQD
237.0000 mL | Freq: Two times a day (BID) | ORAL | Status: DC
  Administered 2015-11-03 – 2015-11-13 (×17): 237 mL via ORAL

## 2015-11-02 MED ORDER — DOCUSATE SODIUM 100 MG PO CAPS
100.0000 mg | ORAL_CAPSULE | Freq: Two times a day (BID) | ORAL | Status: DC | PRN
Start: 2015-11-02 — End: 2015-11-13
  Administered 2015-11-11: 100 mg via ORAL
  Filled 2015-11-02: qty 1

## 2015-11-02 MED ORDER — CALCIUM GLUCONATE 10 % IV SOLN
1.0000 g | Freq: Once | INTRAVENOUS | Status: AC
  Administered 2015-11-03: 1 g via INTRAVENOUS
  Filled 2015-11-02: qty 10

## 2015-11-02 MED ORDER — ASPIRIN EC 81 MG PO TBEC
81.0000 mg | DELAYED_RELEASE_TABLET | ORAL | Status: DC
  Administered 2015-11-03: 81 mg via ORAL
  Filled 2015-11-02: qty 1

## 2015-11-02 MED ORDER — OXYCODONE HCL 5 MG PO TABS
5.0000 mg | ORAL_TABLET | ORAL | Status: DC | PRN
  Administered 2015-11-03: 5 mg via ORAL
  Filled 2015-11-02 (×2): qty 1

## 2015-11-02 MED ORDER — FAMOTIDINE 20 MG PO TABS
20.0000 mg | ORAL_TABLET | Freq: Two times a day (BID) | ORAL | Status: DC
  Administered 2015-11-03 – 2015-11-04 (×4): 20 mg via ORAL
  Filled 2015-11-02 (×4): qty 1

## 2015-11-02 MED ORDER — TAMSULOSIN HCL 0.4 MG PO CAPS
0.4000 mg | ORAL_CAPSULE | Freq: Every day | ORAL | Status: DC
  Administered 2015-11-03 – 2015-11-12 (×10): 0.4 mg via ORAL
  Filled 2015-11-02 (×10): qty 1

## 2015-11-02 MED ORDER — SODIUM CHLORIDE 0.9 % IV BOLUS (SEPSIS)
1000.0000 mL | INTRAVENOUS | Status: AC
  Administered 2015-11-02: 1000 mL via INTRAVENOUS

## 2015-11-02 MED ORDER — DEXTROSE-NACL 5-0.45 % IV SOLN
INTRAVENOUS | Status: DC
  Administered 2015-11-03: 75 mL/h via INTRAVENOUS
  Administered 2015-11-03 – 2015-11-04 (×2): via INTRAVENOUS

## 2015-11-02 MED ORDER — PIPERACILLIN-TAZOBACTAM 3.375 G IVPB 30 MIN
3.3750 g | Freq: Once | INTRAVENOUS | Status: AC
  Administered 2015-11-02: 3.375 g via INTRAVENOUS
  Filled 2015-11-02: qty 50

## 2015-11-02 MED ORDER — HYDRALAZINE HCL 20 MG/ML IJ SOLN: 5.0000 mg | INTRAMUSCULAR | Status: DC | PRN

## 2015-11-02 MED ORDER — ATORVASTATIN CALCIUM 80 MG PO TABS
80.0000 mg | ORAL_TABLET | Freq: Every day | ORAL | Status: DC
  Administered 2015-11-03 – 2015-11-13 (×11): 80 mg via ORAL
  Filled 2015-11-02 (×11): qty 1

## 2015-11-02 NOTE — H&P (Addendum)
[facility] History and Physical  [name] QQI:[id number] DOB: [birth year] DOA: 11/02/2015  Referring physician: ED physician PCP: Charlsie Merles, MD  Specialists:   Chief Complaint: fever, low hemoglobin, leg wound infection and diarrhea.   HPI: [name] is a 60 y.o. male with PMH of Jehovah witness, chronic bilateral non-healing leg wounds, hypertension, hyperlipidemia, diabetes mellitus, cardiac, depression, iron deficiency anemia, OSA not on CPAP, who presents with fever, low hemoglobin in the leg wound infection.  Pt was sent to ED from SNF with concerns of fever, leg wound infection and low hemoglobin. Per SNF report, he was found to have Hgb of 5.0, but our test showed hgb of 7.1 (his hemoglobin was 8.1 on 04/27/15). Patient does not have hematuria or bloody stool. No chest pain or shortness of breath. Patient has a chronic bilateral non-healing leg wounds, which has been worsening recently. He has purulent discharge from the wounds. His legs are swellingand tender. Patient has fever, no chills. No nausea or vomiting. He reports that he has been having mild diarrhea in the past 3 days. He had one bowel movement with loose stooling this morning, since then he did not have more bowel movements. Patient does not have symptoms of UTI, or unilateral weakness.  In ED, patient was found to have negative FOBT, CBG 38, potassium 6.4 without T-wave peaking, acute renal injury with creatinine 2.23, lactate 1.84, temperature 100.0, no tachycardia, negative chest x-ray for acute abnormalities, x-ray of bilateral tibia/fibula is negative for bony abnormalities. Patient is admitted to inpatient for further evaluation and treatment.  EKG: Independently reviewed. QTC 415, no T-wave peaking.   Where does patient live?   SNF  Can patient participate in ADLs?  None Review of Systems:   General: has fevers, no chills, has poor appetite, has fatigue HEENT: no blurry vision, hearing changes or  sore throat Pulm: no dyspnea, coughing, wheezing CV: no chest pain, palpitations Abd: no nausea, vomiting, abdominal pain, has diarrhea, no constipation GU: no dysuria, burning on urination, increased urinary frequency, hematuria  Ext: has bilateral leg edema, tenderness. Neuro: no unilateral weakness, numbness, or tingling, no vision change or hearing loss Skin: has multiple nonhealing wounds over legs. MSK: No muscle spasm, no deformity, no limitation of range of movement in spin Heme: No easy bruising.  Travel history: No recent long distant travel.  Allergy:  Allergies  Allergen Reactions  . Other     Pt is a Jehovah Witness. No blood products.  . Sulfa Antibiotics     Causes flu-like symptom : sweating, chills, fever, body aches    Past Medical History  Diagnosis Date  . Hypertension   . Chronic hip pain   . GERD (gastroesophageal reflux disease)   . Depression   . Hypercholesterolemia   . Type II diabetes mellitus ([place])   . Pneumonia 1960's X 1  . Anemia   . Sleep apnea     "couldn't wear the mask" (04/26/2015)  . Thalassemia minor   . Arthritis     "left hip" (04/26/2015)  . Cellulitis and abscess of leg hositalized 04/25/2015    left    Past Surgical History  Procedure Laterality Date  . Inguinal hernia repair Right ~ 2004  . Shoulder arthroscopy w/ rotator cuff repair Right ~ 2004  . Toenail excision Right 1980's X 2    "big toe"    Social History:  reports that he has been smoking Cigarettes.  He has a 5.4 pack-year smoking history. He has  never used smokeless tobacco. He reports that he drinks alcohol. He reports that he uses illicit drugs (Cocaine and Marijuana).  Family History:  Family History  Problem Relation Age of Onset  . Diabetes Mellitus II Mother   . Diabetes Mellitus II Father   . Diabetes Mellitus II Brother      Prior to Admission medications   Medication Sig Start Date End Date Taking? Authorizing Provider  acetaminophen (TYLENOL) 325 MG  tablet Take 2 tablets (650 mg total) by mouth every 6 (six) hours as needed for mild pain (or Fever >/= 101). 03/23/15   Robbie Lis, MD  aspirin EC 81 MG tablet Take 81 mg by mouth every morning.    Historical Provider, MD  atorvastatin (LIPITOR) 80 MG tablet Take 80 mg by mouth daily.    Historical Provider, MD  capsicum oleoresin (TRIXAICIN) 0.025 % cream Apply 1 application topically 2 (two) times daily.    Historical Provider, MD  docusate sodium (COLACE) 100 MG capsule Take 1 capsule (100 mg total) by mouth 2 (two) times daily as needed for mild constipation. 03/23/15   Robbie Lis, MD  enoxaparin (LOVENOX) 150 MG/ML injection Inject 0.36 mLs (55 mg total) into the skin daily. 03/23/15   Robbie Lis, MD  famotidine (PEPCID) 20 MG tablet Take 1 tablet (20 mg total) by mouth 2 (two) times daily. 03/23/15   Robbie Lis, MD  ferrous sulfate 325 (65 FE) MG tablet Take 325 mg by mouth 3 (three) times daily with meals.    Historical Provider, MD  gabapentin (NEURONTIN) 300 MG capsule Take 300 mg by mouth 3 (three) times daily.    Historical Provider, MD  hydrALAZINE (APRESOLINE) 25 MG tablet Take 1 tablet (25 mg total) by mouth every 8 (eight) hours. 03/23/15   Robbie Lis, MD  hydrocerin (EUCERIN) CREA Apply 1 application topically 2 (two) times daily. 04/04/15   Donne Hazel, MD  insulin aspart (NOVOLOG) 100 UNIT/ML injection Inject 0-9 Units into the skin 3 (three) times daily with meals. 03/23/15   Robbie Lis, MD  insulin aspart protamine- aspart (NOVOLOG MIX 70/30) (70-30) 100 UNIT/ML injection Inject 0.25 mLs (25 Units total) into the skin 2 (two) times daily with a meal. 04/04/15   Donne Hazel, MD  lisinopril (PRINIVIL,ZESTRIL) 10 MG tablet Take 1 tablet (10 mg total) by mouth daily. 04/04/15   Donne Hazel, MD  meloxicam (MOBIC) 7.5 MG tablet Take 7.5 mg by mouth 2 (two) times daily.    Historical Provider, MD  mirtazapine (REMERON) 15 MG tablet Take 15 mg by mouth at bedtime.     Historical Provider, MD  Multiple Vitamin (MULTIVITAMIN WITH MINERALS) TABS tablet Take 1 tablet by mouth daily. 03/23/15   Robbie Lis, MD  ondansetron (ZOFRAN) 4 MG tablet Take 1 tablet (4 mg total) by mouth every 6 (six) hours as needed for nausea. 03/23/15   Robbie Lis, MD  oxybutynin (DITROPAN-XL) 10 MG 24 hr tablet Take 10 mg by mouth at bedtime.    Historical Provider, MD  oxyCODONE-acetaminophen (PERCOCET/ROXICET) 5-325 MG per tablet Take 1 tablet by mouth every 8 (eight) hours as needed for moderate pain or severe pain. 04/27/15   Reyne Dumas, MD  polyethylene glycol (MIRALAX / GLYCOLAX) packet Take 17 g by mouth daily as needed for mild constipation. 04/04/15   Donne Hazel, MD  protein supplement (UNJURY CHOCOLATE CLASSIC) POWD Take 7 g (2 oz total) by mouth 3 (  three) times daily. 03/23/15   Robbie Lis, MD  tamsulosin (FLOMAX) 0.4 MG CAPS capsule Take 0.4 mg by mouth daily after supper.    Historical Provider, MD    Physical Exam: Filed Vitals:   11/02/15 2004 11/02/15 2015 11/02/15 2023 11/02/15 2100  BP: 116/57 129/106    Pulse: 98 100    Temp: 99.6 F (37.6 C)  100 F (37.8 C)   TempSrc: Oral  Rectal   Resp: 20 13    Height:    6' 4.38" (1.94 m)  Weight:    99.9 kg (220 lb 3.8 oz)  SpO2: 94% 84%     General: Not in acute distress HEENT:       Eyes: PERRL, EOMI, no scleral icterus.       ENT: No discharge from the ears and nose, no pharynx injection, no tonsillar enlargement.        Neck: No JVD, no bruit, no mass felt. Heme: No neck lymph node enlargement. Cardiac: S1/S2, RRR, No murmurs, No gallops or rubs. Pulm: No rales, wheezing, rhonchi or rubs. Abd: Soft, nondistended, nontender, no rebound pain, no organomegaly, BS present. Ext: 2+ leg pitting leg edema bilaterally. 2+DP/PT pulse in right leg, has faint pulse in left leg, but DP/PT pulses are easily detected by portable ultrasound. Musculoskeletal: No joint deformities, No joint redness or warmth, no  limitation of ROM in spin. Skin: Multiple large nonhealing wounds to bilateral lower extremities with associated purulent drainage and erythema.   Neuro: Alert, oriented X3, cranial nerves II-XII grossly intact, moves all extremities. Psych: Patient is not psychotic, no suicidal or hemocidal ideation.  Labs on Admission:  Basic Metabolic Panel:  Recent Labs Lab 11/02/15 2015 11/02/15 2307  NA 140  --   K 6.4* 6.5*  CL 107  --   CO2 20*  --   GLUCOSE 63*  --   BUN 56*  --   CREATININE 2.23*  --   CALCIUM 8.9  --    Liver Function Tests:  Recent Labs Lab 11/02/15 2015  AST 35  ALT 42  ALKPHOS 179*  BILITOT 0.6  PROT 7.3  ALBUMIN 2.5*   No results for input(s): LIPASE, AMYLASE in the last 168 hours. No results for input(s): AMMONIA in the last 168 hours. CBC:  Recent Labs Lab 11/02/15 2015  WBC 20.1*  NEUTROABS 17.5*  HGB 7.1*  HCT 23.0*  MCV 61.5*  PLT 309   Cardiac Enzymes:  Recent Labs Lab 11/02/15 2015  TROPONINI 0.04*    BNP (last 3 results) No results for input(s): BNP in the last 8760 hours.  ProBNP (last 3 results) No results for input(s): PROBNP in the last 8760 hours.  CBG:  Recent Labs Lab 10/27/15 1552 11/02/15 2010 11/02/15 2316 11/02/15 2334  GLUCAP 165* 79 38* 262*    Radiological Exams on Admission: Dg Tibia/fibula Left  11/02/2015  CLINICAL DATA:  60 year old male with a chronic wounds on the bilateral lower legs. Rule out subcutaneous gas. EXAM: LEFT TIBIA AND FIBULA - 2 VIEW COMPARISON:  Radiograph dated 04/26/2015 and right lower extremity radiograph dated 11/02/2015 FINDINGS: There is no acute fracture or dislocation. No periosteal reaction or erosive changes identified to suggest osteomyelitis. No soft tissue gas identified. There is diffuse subcutaneous soft tissue stranding and edema. Dressing noted over the mid calf. IMPRESSION: No acute osseous pathology. No Soft tissue gas. Electronically Signed   By: Anner Crete  M.D.   On: 11/02/2015 21:48   Dg  Tibia/fibula Right  11/02/2015  CLINICAL DATA:  Chronic right lower extremity wounds. Initial encounter. EXAM: RIGHT TIBIA AND FIBULA - 2 VIEW COMPARISON:  Right tibia/fibula radiographs performed 04/26/2015 FINDINGS: There is no evidence of fracture or dislocation. The tibia and fibula appear grossly intact. Known soft tissue wounds are not well characterized. The ankle joint is grossly unremarkable in appearance. The knee joint is grossly unremarkable, aside from a large enthesophyte arising at the upper pole of the patella. A fabella is noted. IMPRESSION: No evidence of fracture or dislocation. Known soft tissue wounds are not well characterized. Electronically Signed   By: Garald Balding M.D.   On: 11/02/2015 21:48   Dg Chest Port 1 View  11/02/2015  CLINICAL DATA:  60 year old male with sepsis EXAM: PORTABLE CHEST 1 VIEW COMPARISON:  Radiograph dated 03/26/2015 FINDINGS: Single-view of the chest demonstrate hypovolemic lungs. There are mild bibasilar atelectatic changes noted. There is no focal consolidation, pleural effusion, or pneumothorax. Minimal vascular prominence may represent mild congestion. The cardiac silhouette is within normal limits. The osseous structures appear unremarkable. IMPRESSION: No focal consolidation. Electronically Signed   By: Anner Crete M.D.   On: 11/02/2015 20:45    Assessment/Plan Principal Problem:   Cellulitis of leg Active Problems:   Sepsis ([place])   DM (diabetes mellitus), secondary, uncontrolled, with peripheral vascular complications ([facility])   Microcytic anemia   Leg ulcer ([facility])   AKI (acute kidney injury) ([place])   Hyperkalemia   Hypoglycemia  Addendum: morning lab showed K=5.9 and Hgb dropped from 7.1 to 5.9. Patient is stable. No chest pain or shortness of breath. Hemodynamically stable. -will transfuse pt 2 units of blood -check FOBT. If positive, will consult to GI. If negative, will do hemolysis workup -NPO  now -transfer to SDU -check stat EKG to see if there is T wave peaking -will d/c lovenox PPx until ruling out GIB. Pending FOBT. Due to multiple wounds in both legs, patient cannot do SCD.  Cellulitis of legs and sepsis: Patient has some multiple nonhealing wound in legs, with purulent discharge, has wound infection and cellulitis. This is septic with leukocytosis, fever. Lactatr is 1.84. Hemodynamically stable.  - will admit to tele bed - Empiric antimicrobial treatment with vancomycin and Zosyn per pharmacy - PRN Zofran for nausea - pain control: continue home oxycontin and prn oxycodone - Blood cultures x 2  - ESR and CRP - wound care consult - will get Procalcitonin and trend lactic acid levels per sepsis protocol. - IVF: 2.5 L of NS bolus in ED, followed by 75 cc/h  - May consult ortho in AM -LE doppler to r/o DVT  DM-II: Last A1c 9.0 on 04/27/15, poorly controled. Patient is taking mixed insulin 70/30, metformin and NovoLog at home. He had hypoglycemia with blood sugar level is 38, which improved after treated with D50.  -will hold long lasting insuling now -check cbg q1h -SSI -on D5-1/2 NS at 75 cc/h  Hypoglycemia: CBG=38. Mental status okay -See above  Microcytic iron deficiency anemia: Patient was reportedly to have low hemoglobin, but our CBC test showed hemoglobin 7.1 which is slightly lower than previous 8.1 on 04/27/15. FOBT is negative. Patient denies hematuria or bloody stool. This is likely gradually worsening anemia. Of note, patient is Jehovah witness. No blood transfusion should be given. - check cbc daily - Continue iron supplement  Addendum:   AKI: Likely due to prerenal secondary to dehydration and continuation of ACEI and NSAIDs - IVF as above - Check FeNa -  US-renal - Follow up renal function by BMP - Hold lisinopril, Mobic and diclofenac  HTN: -Hold lisinopril since patient is to risk of developing hypotension due to sepsis, also because of worsening  renal function -Hydralazine when necessary  Hyperkalemia: Potassium 6.4 without EKG change. It is most likely due to worsening renal function -Patient was treated with D50, calcium gluconate, 30 g of Kayexalate, 50 mEq of sodium bicarbonate and 3 units of novolog. -continue IVF  Diarrhea: unclear etiology -IV fluid as above -Check C. difficile PCR and a GI pathogen panel   DVT ppx: SQ Lovenox-->will d/c lovenox until ruling out GIB. Due to multiple wounds in both legs, patient cannot do SCD.  Code Status: Full code Family Communication: None at bed side.     Disposition Plan: Admit to inpatient   Date of Service 11/02/2015    Ivor Costa [facility] Pager [phone]  If 7PM-7AM, please contact night-coverage [email or web address] Password [email or web address] 11/02/2015, 11:44 PM

## 2015-11-02 NOTE — ED Notes (Addendum)
Per [facility]: pt from [facility] and transferred here due to elevated WBC, fever, low hgb. Pt also has ulcerations to bilateral legs.

## 2015-11-02 NOTE — Consult Note (Signed)
Pharmacy Antibiotic Note  [name] is a 60 y.o. male admitted on 11/02/2015 with cellulitis.  Pharmacy has been consulted for vanc/zosyn dosing.  Plan: Vanc 1500 mg IV x1 Zosyn 3.375 g IV over 30 min x1 F/u labs for maintenance dosing     Temp (24hrs), Avg:99.8 F (37.7 C), Min:99.6 F (37.6 C), Max:100 F (37.8 C)   Recent Labs Lab 11/02/15 2026  LATICACIDVEN 1.84    CrCl cannot be calculated (Unknown ideal weight.).    Allergies  Allergen Reactions  . Other     Pt is a Jehovah Witness. No blood products.  . Sulfa Antibiotics     Causes flu-like symptom : sweating, chills, fever, body aches    Thank you for allowing pharmacy to be a part of this patient's care.  Joya San, PharmD Clinical Pharmacy Resident Pager # [phone] 11/02/2015 9:31 PM    Pharmacy Code Sepsis Protocol  Time of code sepsis page: 20:37 []  Antibiotics delivered at 20:47  Were antibiotics ordered at the time of the code sepsis page? Yes Was it required to contact the physician? [x]  Physician not contacted []  Physician contacted to order antibiotics for code sepsis []  Physician contacted to recommend changing antibiotics  Pharmacy consulted for: vanc/zosyn  Anti-infectives    Start     Dose/Rate Route Frequency Ordered Stop   11/02/15 2045  vancomycin (VANCOCIN) 1,500 mg in sodium chloride 0.9 % 500 mL IVPB     1,500 mg 250 mL/hr over 120 Minutes Intravenous  Once 11/02/15 2040     11/02/15 2030  piperacillin-tazobactam (ZOSYN) IVPB 3.375 g     3.375 g 100 mL/hr over 30 Minutes Intravenous  Once 11/02/15 2024     11/02/15 2030  vancomycin (VANCOCIN) IVPB 1000 mg/200 mL premix  Status:  Discontinued     1,000 mg 200 mL/hr over 60 Minutes Intravenous  Once 11/02/15 2024 11/02/15 2040        Nurse education provided: [x]  Minutes left to administer antibiotics to achieve 1 hour goal [x]  Correct order of antibiotic administration [x]  Antibiotic Y-site compatibilities     Joya San, PharmD Clinical Pharmacy Resident Pager # [phone] 11/02/2015 8:49 PM

## 2015-11-02 NOTE — ED Notes (Signed)
VS with EMS: 108/64, HR-112, CBG-77, 96% RA

## 2015-11-02 NOTE — ED Provider Notes (Signed)
CSN: [id number]     Arrival date & time 11/02/15  1952 History   First MD Initiated Contact with Patient 11/02/15 1958     Chief Complaint  Patient presents with  . Abnormal Lab     (Consider location/radiation/quality/duration/timing/severity/associated sxs/prior Treatment) The history is provided by the patient and medical records.   Patient is a 60 year old male with a past medical history of hypertension, diabetes, and nonhealing ulcers to bilateral lower extremities who presents from his nursing facility for concerns of fever, leukocytosis, anemia. He states that for the past 3 days he has had diarrhea with dark brown stools. No history of GI bleed in the past. He otherwise denies any chest pain, shortness of breath, or abdominal pain. Review of outside records show a white count of 20,000 and hemoglobin of 6.6 potassium is 6.1 and creatinine 1.72. He also states that he feels like his leg wounds are worsening. He reports chills and subjective fevers. He has not tried anything for this. Nothing has made this better or worse  Past Medical History  Diagnosis Date  . Hypertension   . Chronic hip pain   . GERD (gastroesophageal reflux disease)   . Depression   . Hypercholesterolemia   . Type II diabetes mellitus ([place])   . Pneumonia 1960's X 1  . Anemia   . Sleep apnea     "couldn't wear the mask" (04/26/2015)  . Thalassemia minor   . Arthritis     "left hip" (04/26/2015)  . Cellulitis and abscess of leg hositalized 04/25/2015    left   Past Surgical History  Procedure Laterality Date  . Inguinal hernia repair Right ~ 2004  . Shoulder arthroscopy w/ rotator cuff repair Right ~ 2004  . Toenail excision Right 1980's X 2    "big toe"   Family History  Problem Relation Age of Onset  . Diabetes Mellitus II Mother   . Diabetes Mellitus II Father   . Diabetes Mellitus II Brother    Social History  Substance Use Topics  . Smoking status: Current Every Day Smoker -- 0.12 packs/day for  45 years    Types: Cigarettes  . Smokeless tobacco: Never Used  . Alcohol Use: Yes     Comment: 04/26/2015 "I may drink 1/2 glass of wine or a couple beers 1-2 times/month; if that"    Review of Systems  Constitutional: Positive for fever, chills and fatigue.  HENT: Negative.   Eyes: Negative for visual disturbance.  Respiratory: Negative for cough, shortness of breath and wheezing.   Cardiovascular: Negative for chest pain.  Gastrointestinal: Positive for diarrhea. Negative for nausea, vomiting and abdominal pain.  Genitourinary: Negative.   Musculoskeletal: Negative.   Skin: Positive for rash and wound. Negative for pallor.  Neurological: Negative.       Allergies  Other and Sulfa antibiotics  Home Medications   Prior to Admission medications   Medication Sig Start Date End Date Taking? Authorizing Provider  acetaminophen (TYLENOL) 325 MG tablet Take 2 tablets (650 mg total) by mouth every 6 (six) hours as needed for mild pain (or Fever >/= 101). 03/23/15  Yes Robbie Lis, MD  Amino Acids-Protein Hydrolys (FEEDING SUPPLEMENT, PRO-STAT SUGAR FREE 64,) LIQD Take 30 mLs by mouth 3 (three) times daily with meals.   Yes Historical Provider, MD  aspirin EC 81 MG tablet Take 81 mg by mouth every morning.   Yes Historical Provider, MD  atorvastatin (LIPITOR) 80 MG tablet Take 80 mg by mouth daily.  Yes Historical Provider, MD  diclofenac (VOLTAREN) 75 MG EC tablet Take 75 mg by mouth 2 (two) times daily as needed for mild pain.   Yes Historical Provider, MD  docusate sodium (COLACE) 100 MG capsule Take 1 capsule (100 mg total) by mouth 2 (two) times daily as needed for mild constipation. 03/23/15  Yes Robbie Lis, MD  famotidine (PEPCID) 20 MG tablet Take 1 tablet (20 mg total) by mouth 2 (two) times daily. 03/23/15  Yes Robbie Lis, MD  ferrous sulfate 325 (65 FE) MG tablet Take 325 mg by mouth 3 (three) times daily with meals.   Yes Historical Provider, MD  insulin aspart (NOVOLOG)  100 UNIT/ML injection Inject 0-9 Units into the skin 3 (three) times daily with meals. 03/23/15  Yes Robbie Lis, MD  insulin aspart protamine- aspart (NOVOLOG MIX 70/30) (70-30) 100 UNIT/ML injection Inject 0.25 mLs (25 Units total) into the skin 2 (two) times daily with a meal. 04/04/15  Yes Donne Hazel, MD  lisinopril (PRINIVIL,ZESTRIL) 10 MG tablet Take 1 tablet (10 mg total) by mouth daily. 04/04/15  Yes Donne Hazel, MD  meloxicam (MOBIC) 7.5 MG tablet Take 7.5 mg by mouth 2 (two) times daily.   Yes Historical Provider, MD  metFORMIN (GLUCOPHAGE) 1000 MG tablet Take 1,000 mg by mouth 2 (two) times daily with a meal.   Yes Historical Provider, MD  mirtazapine (REMERON) 15 MG tablet Take 15 mg by mouth at bedtime.   Yes Historical Provider, MD  Multiple Vitamin (MULTIVITAMIN WITH MINERALS) TABS tablet Take 1 tablet by mouth daily. 03/23/15  Yes Robbie Lis, MD  Nutritional Supplements (BOOST GLUCOSE CONTROL PO) Take 1 each by mouth 2 (two) times daily.   Yes Historical Provider, MD  ondansetron (ZOFRAN) 4 MG tablet Take 1 tablet (4 mg total) by mouth every 6 (six) hours as needed for nausea. 03/23/15  Yes Robbie Lis, MD  oxybutynin (DITROPAN-XL) 10 MG 24 hr tablet Take 10 mg by mouth at bedtime.   Yes Historical Provider, MD  oxyCODONE (OXY IR/ROXICODONE) 5 MG immediate release tablet Take 5 mg by mouth every 4 (four) hours as needed for severe pain.   Yes Historical Provider, MD  oxyCODONE (OXYCONTIN) 10 mg 12 hr tablet Take 10 mg by mouth every 12 (twelve) hours.   Yes Historical Provider, MD  polyethylene glycol (MIRALAX / GLYCOLAX) packet Take 17 g by mouth daily as needed for mild constipation. 04/04/15  Yes Donne Hazel, MD  pregabalin (LYRICA) 75 MG capsule Take 75 mg by mouth 2 (two) times daily.   Yes Historical Provider, MD  tamsulosin (FLOMAX) 0.4 MG CAPS capsule Take 0.4 mg by mouth daily after supper.   Yes Historical Provider, MD  enoxaparin (LOVENOX) 150 MG/ML injection  Inject 0.36 mLs (55 mg total) into the skin daily. Patient not taking: Reported on 11/02/2015 03/23/15   Robbie Lis, MD   BP 110/60 mmHg  Pulse 86  Temp(Src) 99.2 F (37.3 C) (Oral)  Resp 16  Ht 6' 4.38" (1.94 m)  Wt 99.9 kg  BMI 26.54 kg/m2  SpO2 100% Physical Exam  Constitutional: He is oriented to person, place, and time. He appears well-developed and well-nourished. He appears ill.  [place] IN PLACE  HENT:  Head: Normocephalic and atraumatic.  Mouth/Throat: No oropharyngeal exudate.  Eyes: EOM are normal. Pupils are equal, round, and reactive to light.  Neck: Normal range of motion. Neck supple.  Cardiovascular: Regular rhythm, normal heart  sounds and intact distal pulses.   Tachycardia  Pulmonary/Chest: Effort normal and breath sounds normal. No respiratory distress. He has no rales. He exhibits no tenderness.  Abdominal: Soft. He exhibits no distension. There is no tenderness. There is no rebound and no guarding.  Genitourinary: Rectum normal. Guaiac negative stool.  Musculoskeletal: He exhibits edema and tenderness.  Multiple large nonhealing wounds to bilateral lower extremities with associated purulent drainage and erythema.  Neurological: He is alert and oriented to person, place, and time. No cranial nerve deficit. He exhibits normal muscle tone. Coordination normal.  Skin: Skin is warm and dry. No rash noted. There is erythema. No pallor.  Psychiatric: He has a normal mood and affect.  Nursing note and vitals reviewed.     ED Course  Procedures (including critical care time) Labs ReviewImaging Review Dg Tibia/fibula Left  11/02/2015  CLINICAL DATA:  60 year old male with a chronic wounds on the bilateral lower legs. Rule out subcutaneous gas. EXAM: LEFT TIBIA AND FIBULA - 2 VIEW COMPARISON:  Radiograph dated 04/26/2015 and right lower extremity radiograph dated 11/02/2015 FINDINGS: There is no acute fracture or dislocation. No periosteal reaction or erosive changes  identified to suggest osteomyelitis. No soft tissue gas identified. There is diffuse subcutaneous soft tissue stranding and edema. Dressing noted over the mid calf. IMPRESSION: No acute osseous pathology. No Soft tissue gas. Electronically Signed   By: Anner Crete M.D.   On: 11/02/2015 21:48   Dg Tibia/fibula Right  11/02/2015  CLINICAL DATA:  Chronic right lower extremity wounds. Initial encounter. EXAM: RIGHT TIBIA AND FIBULA - 2 VIEW COMPARISON:  Right tibia/fibula radiographs performed 04/26/2015 FINDINGS: There is no evidence of fracture or dislocation. The tibia and fibula appear grossly intact. Known soft tissue wounds are not well characterized. The ankle joint is grossly unremarkable in appearance. The knee joint is grossly unremarkable, aside from a large enthesophyte arising at the upper pole of the patella. A fabella is noted. IMPRESSION: No evidence of fracture or dislocation. Known soft tissue wounds are not well characterized. Electronically Signed   By: Garald Balding M.D.   On: 11/02/2015 21:48   US Renal  11/03/2015  CLINICAL DATA:  60 year old male with acute renal insufficiency EXAM: RENAL / URINARY TRACT ULTRASOUND COMPLETE COMPARISON:  CT dated 03/17/2015 FINDINGS: Right Kidney: Length: 11.1 cm. Echogenicity within normal limits. No mass or hydronephrosis visualized. Left Kidney: Length: 11.7 cm. Echogenicity within normal limits. No mass or hydronephrosis visualized. Bladder: The urinary bladder is collapsed and not visualized. IMPRESSION: No hydronephrosis or echogenic stone. Electronically Signed   By: Anner Crete M.D.   On: 11/03/2015 01:17   Dg Chest Port 1 View  11/02/2015  CLINICAL DATA:  60 year old male with sepsis EXAM: PORTABLE CHEST 1 VIEW COMPARISON:  Radiograph dated 03/26/2015 FINDINGS: Single-view of the chest demonstrate hypovolemic lungs. There are mild bibasilar atelectatic changes noted. There is no focal consolidation, pleural effusion, or pneumothorax.  Minimal vascular prominence may represent mild congestion. The cardiac silhouette is within normal limits. The osseous structures appear unremarkable. IMPRESSION: No focal consolidation. Electronically Signed   By: Anner Crete M.D.   On: 11/02/2015 20:45   I have personally reviewed and evaluated these images and lab results as part of my medical decision-making.   EKG Interpretation   Date/Time:  Thursday November 02 2015 20:04:14 EST Ventricular Rate:  97 PR Interval:  141 QRS Duration: 90 QT Interval:  327 QTC Calculation: 415 R Axis:   61 Text Interpretation:  Sinus  rhythm Probable left atrial enlargement Rate  faster Confirmed by Wyvonnia Dusky  MD, STEPHEN [phone]) on 11/02/2015 8:49:19 PM      MDM   Final diagnoses:  Sepsis, due to unspecified organism ([place])  Cellulitis of lower extremity, unspecified laterality  Nonhealing ulcer of left lower extremity ([place])  Leg swelling    Patient is a 60 year old male who presents from his facility for concerns of sepsis and anemia. He has chronic nonhealing wounds on his lower extremities which he thinks are worsening. Further history and exam as above notable for large wounds to his bilateral lower extremities with associated purulent drainage and erythema. Patient found to be tachycardic and with a rectal temperature of 100 after given Tylenol by EMS. Code sepsis initiated. IV fluids given and antibiotics started. Patient found to be hyperkalemic with acute kidney injury without evidence of EKG changes. Guaiac stool test negative. X-rays the lower extremities obtained to rule out subcutaneous gas WHICH were negative. His hyperkalemia temporized. On cbg checks, pt hypoglycemic but otherwise at his baseline. D50 given. He will be admitted to medicine for further management and evaluation    Heriberto Antigua, MD 11/03/15 [place], MD 11/04/15 [phone]

## 2015-11-03 ENCOUNTER — Ambulatory Visit (HOSPITAL_COMMUNITY): Payer: Medicare Other

## 2015-11-03 ENCOUNTER — Inpatient Hospital Stay (HOSPITAL_COMMUNITY): Payer: Medicare Other

## 2015-11-03 DIAGNOSIS — L03115 Cellulitis of right lower limb: Secondary | ICD-10-CM

## 2015-11-03 DIAGNOSIS — D62 Acute posthemorrhagic anemia: Secondary | ICD-10-CM

## 2015-11-03 DIAGNOSIS — L03116 Cellulitis of left lower limb: Secondary | ICD-10-CM

## 2015-11-03 LAB — HEMOGLOBIN AND HEMATOCRIT, BLOOD
HEMATOCRIT: 20.5 % — AB (ref 39.0–52.0)
Hemoglobin: 6.1 g/dL — CL (ref 13.0–17.0)

## 2015-11-03 LAB — NO BLOOD PRODUCTS

## 2015-11-03 LAB — GASTROINTESTINAL PANEL BY PCR, STOOL (REPLACES STOOL CULTURE)
Adenovirus F40/41: NOT DETECTED
Astrovirus: NOT DETECTED
CAMPYLOBACTER SPECIES: NOT DETECTED
CRYPTOSPORIDIUM: NOT DETECTED
CYCLOSPORA CAYETANENSIS: NOT DETECTED
E. COLI O157: NOT DETECTED
ENTEROPATHOGENIC E COLI (EPEC): NOT DETECTED
ENTEROTOXIGENIC E COLI (ETEC): NOT DETECTED
Entamoeba histolytica: NOT DETECTED
Enteroaggregative E coli (EAEC): NOT DETECTED
Giardia lamblia: NOT DETECTED
Norovirus GI/GII: NOT DETECTED
PLESIMONAS SHIGELLOIDES: NOT DETECTED
ROTAVIRUS A: NOT DETECTED
SAPOVIRUS (I, II, IV, AND V): NOT DETECTED
SHIGA LIKE TOXIN PRODUCING E COLI (STEC): NOT DETECTED
Salmonella species: NOT DETECTED
Shigella/Enteroinvasive E coli (EIEC): NOT DETECTED
VIBRIO SPECIES: NOT DETECTED
Vibrio cholerae: NOT DETECTED
YERSINIA ENTEROCOLITICA: NOT DETECTED

## 2015-11-03 LAB — CBC
HEMATOCRIT: 19.5 % — AB (ref 39.0–52.0)
HEMOGLOBIN: 5.9 g/dL — AB (ref 13.0–17.0)
MCH: 18.3 pg — AB (ref 26.0–34.0)
MCHC: 30.3 g/dL (ref 30.0–36.0)
MCV: 60.4 fL — ABNORMAL LOW (ref 78.0–100.0)
Platelets: 268 10*3/uL (ref 150–400)
RBC: 3.23 MIL/uL — AB (ref 4.22–5.81)
RDW: 19.3 % — ABNORMAL HIGH (ref 11.5–15.5)
WBC: 17.7 10*3/uL — ABNORMAL HIGH (ref 4.0–10.5)

## 2015-11-03 LAB — GLUCOSE, CAPILLARY
GLUCOSE-CAPILLARY: 145 mg/dL — AB (ref 65–99)
GLUCOSE-CAPILLARY: 126 mg/dL — AB (ref 65–99)
GLUCOSE-CAPILLARY: 148 mg/dL — AB (ref 65–99)
GLUCOSE-CAPILLARY: 191 mg/dL — AB (ref 65–99)
GLUCOSE-CAPILLARY: 122 mg/dL — AB (ref 65–99)
GLUCOSE-CAPILLARY: 114 mg/dL — AB (ref 65–99)
GLUCOSE-CAPILLARY: 119 mg/dL — AB (ref 65–99)
Glucose-Capillary: 146 mg/dL — ABNORMAL HIGH (ref 65–99)
Glucose-Capillary: 116 mg/dL — ABNORMAL HIGH (ref 65–99)
Glucose-Capillary: 201 mg/dL — ABNORMAL HIGH (ref 65–99)

## 2015-11-03 LAB — BASIC METABOLIC PANEL
ANION GAP: 8 (ref 5–15)
BUN: 41 mg/dL — ABNORMAL HIGH (ref 6–20)
CHLORIDE: 114 mmol/L — AB (ref 101–111)
CO2: 19 mmol/L — ABNORMAL LOW (ref 22–32)
Calcium: 8.2 mg/dL — ABNORMAL LOW (ref 8.9–10.3)
Creatinine, Ser: 1.47 mg/dL — ABNORMAL HIGH (ref 0.61–1.24)
GFR calc non Af Amer: 50 mL/min — ABNORMAL LOW (ref >60)
GFR, EST AFRICAN AMERICAN: 58 mL/min — AB (ref >60)
Glucose, Bld: 169 mg/dL — ABNORMAL HIGH (ref 65–99)
POTASSIUM: 5.9 mmol/L — AB (ref 3.5–5.1)
SODIUM: 141 mmol/L (ref 135–145)

## 2015-11-03 LAB — SEDIMENTATION RATE: SED RATE: 113 mm/h — AB (ref 0–16)

## 2015-11-03 LAB — HIV ANTIBODY (ROUTINE TESTING W REFLEX): HIV Screen 4th Generation wRfx: NONREACTIVE

## 2015-11-03 LAB — LACTATE DEHYDROGENASE: LDH: 160 U/L (ref 98–192)

## 2015-11-03 LAB — C DIFFICILE QUICK SCREEN W PCR REFLEX
C Diff antigen: POSITIVE — AB
C Diff toxin: NEGATIVE

## 2015-11-03 LAB — CREATININE, URINE, RANDOM: CREATININE, URINE: 155.63 mg/dL

## 2015-11-03 LAB — LACTIC ACID, PLASMA
LACTIC ACID, VENOUS: 2.2 mmol/L — AB (ref 0.5–2.0)
LACTIC ACID, VENOUS: 0.7 mmol/L (ref 0.5–2.0)

## 2015-11-03 LAB — SODIUM, URINE, RANDOM: SODIUM UR: 46 mmol/L

## 2015-11-03 MED ORDER — VANCOMYCIN HCL IN DEXTROSE 1-5 GM/200ML-% IV SOLN
1000.0000 mg | INTRAVENOUS | Status: DC
  Administered 2015-11-03 – 2015-11-04 (×2): 1000 mg via INTRAVENOUS
  Filled 2015-11-03 (×5): qty 200

## 2015-11-03 MED ORDER — DEXTROSE 50 % IV SOLN
50.0000 mL | INTRAVENOUS | Status: DC | PRN
Start: 2015-11-03 — End: 2015-11-13
  Administered 2015-11-03 (×3): 50 mL via INTRAVENOUS
  Filled 2015-11-03 (×3): qty 50

## 2015-11-03 MED ORDER — PIPERACILLIN-TAZOBACTAM 3.375 G IVPB
3.3750 g | Freq: Three times a day (TID) | INTRAVENOUS | Status: DC
  Administered 2015-11-03 – 2015-11-10 (×21): 3.375 g via INTRAVENOUS
  Filled 2015-11-03 (×28): qty 50

## 2015-11-03 MED ORDER — INSULIN ASPART 100 UNIT/ML ~~LOC~~ SOLN
3.0000 [IU] | Freq: Once | SUBCUTANEOUS | Status: AC
Start: 2015-11-03 — End: 2015-11-03
  Administered 2015-11-03: 3 [IU] via SUBCUTANEOUS

## 2015-11-03 MED ORDER — SODIUM CHLORIDE 0.9 % IV SOLN: Freq: Once | INTRAVENOUS | Status: DC

## 2015-11-03 MED ORDER — SODIUM CHLORIDE 0.9 % IV BOLUS (SEPSIS)
500.0000 mL | Freq: Once | INTRAVENOUS | Status: AC
  Administered 2015-11-03: 500 mL via INTRAVENOUS

## 2015-11-03 NOTE — Consult Note (Signed)
WOC wound consult note Reason for Consult:Patient with long-standing (chronic) ulcerations on the bilateral LEs, seen by outpatient wound care center at [facility] where Dr. Dellia Nims has (per patient) made a consult to [facility] in [facility] for intraoperative wound debridements.  Patient was last seen in the [facility] on Friday, February 3rd.  Patient cannot recall if a date has been determined for his inpatient visit at [facility]. 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 Pressure Ulcer POA: No Measurement: Right medial LE ulcer (full thickness crater):  10cm x 7cm x 0.4cm Left medial LE ulcer (partial thickness):  5.5cm x 8cm x 0.2cm Left posterior LE (full thickness):  8cm x 16cm x 0.4cm Left lateral LE (full thickness crater): 10cm x 4cm x 1cm Left lateral to anterior LE (full thickness craters, three (3) distinct ulcers):  Proximal: 3cm x 2cm x 1cm, Mid: 6cm x 4.5cm x 1cm, Distal: 3.5cm x 3cm x 1cm Wound bed:All present withy same clinical illustration: ruddy red with yellow slough, non-granulating wound beds Drainage (amount, consistency, odor) moderate amount of light yellow exudate on old dressings Periwound:intact, dry.  Right LE with evidence of either previous wound healing or new areas of tissue vulnerability beginning to present. Dressing procedure/placement/frequency: Patent was using hydrofera blue, an antimicrobial dressing for the [facility].  We do not stock that product in house, but I will recommend we stay with the antimicrobial category of advanced wound dressings and have provided orders and Nursing guidance for Aquacel Ag+, a silver hydrofiber for antimicrobial as well as absorptive capacity, performed daily.  Additionally, I will provide pressure redistribution heel boots to correct foot alignment and to mildly and gently elevate.  Further recommendations may require involvement of either orthopedics, vascular or plastic surgery and may benefit from conversation  with Dr. Dellia Nims, who has made arrangements for the patient to be seen at [facility]. If you agree, please initiate consultation. [place] nursing team will not follow, but will remain available to this patient, the nursing and medical teams.  Please re-consult if needed. Thanks, Maudie Flakes, MSN, RN, [place], Arther Abbott  Pager# [phone]  Time spent on this consultation = 60 minutes

## 2015-11-03 NOTE — Progress Notes (Signed)
PT Cancellation Note  Patient Details Name: [name] MRN: [id number] DOB: 1956-07-12   Cancelled Treatment:    Reason Eval/Treat Not Completed: Medical issues which prohibited therapy (Temp, elevated K+, Hgb <7.0).  MD contacted re:  Hgb 5.9 and will wait until the medical issues are resolved.  Try tomorrow.   Ramond Dial 11/03/2015, 9:20 AM   Mee Hives, PT MS Acute Rehab Dept. Number: [facility] [id number] and [place] [phone]

## 2015-11-03 NOTE — Progress Notes (Signed)
CRITICAL VALUE ALERT  Critical value received:  Hgb 5.9  Date of notification:  11/03/15  Time of notification:  0653  Critical value read back:Yes.    Nurse who received alert:  Teola Bradley, RN  MD notified (1st page):  Dr. Blaine Hamper  Time of first page:  [phone]  MD notified (2nd page): n/a  Time of second page: n/a  Responding MD:  Dr. Blaine Hamper  Time MD responded:  [phone]

## 2015-11-03 NOTE — Progress Notes (Signed)
OT Cancellation Note  Patient Details Name: [name] MRN: [id number] DOB: 08-Jan-1956   Cancelled Treatment:    Reason Eval/Treat Not Completed: Patient not medically ready. Hgb 5.9, critical lactic acid level, pt transferred to step down unit this AM. Will re-attempt eval at later time.  Almon Register [id number] 11/03/2015, 10:23 AM

## 2015-11-03 NOTE — Progress Notes (Signed)
PROGRESS NOTE  [name] [id number] DOB: Jul 29, 1956 DOA: 11/02/2015 PCP: Charlsie Merles, MD  HPI/Recap of past 45 hours: 60 year old male from a nursing facility with history of diabetes mellitus and chronic lower extremity ulcers admitted for low hemoglobin and sepsis secondary to lower extremity ulcers., Getting courses the patient is a Jehovah's Witness and refuses transfusions of blood. Seen this morning in stepdown unit, patient complains of bilateral lower extremity pain, chills and rigors and feeling weak. Is also noted to have hyperkalemia and acute kidney injury.  Assessment/Plan: Principal Problem:   Sepsis secondary to Cellulitis of bilateral lower extremities from chronic ulcers: Noted temperature spike continue IV fluids.  : Patient meets criteria with lactic acidosis, leukocytosis and fever plus hypotension with sources bilateral lower extremities. Continue broad-spectrum IV antibiotics plus fluids. Wound care to see. If fevers persist, will consider CT of lower extremities  Active Problems:  acute kidney injury: Patient's renal function normal prior to this admission, however last checked 6 months ago. Much of this could be from acute blood loss anemia. Responded well to IV fluids and renal function much improved from yesterday. Continue IV fluids    DM (diabetes mellitus), secondary, uncontrolled, with peripheral vascular complications ([place]): CBGs for the most part below 200. Continue to monitor closely    Microcytic anemia/Acute blood loss anemia: Workup in progress. As mentioned before, patient is Jehovah's Witness. At this time no signs of active bleeding. Hemoglobin has ranged around 7-8 likely from chronic disease and acutely low perhaps from GI bleed. Patient is on daily aspirin plus Monique. Have stopped these for now    Leg ulcer ([place])   AKI (acute kidney injury) ([place])   Hyperkalemia   Hypoglycemia   Code Status: Full code  Family Communication: Left message  with wife    Disposition Plan: Continue in stepdown until more stabilized, likely will be here through the weekend    Consultants:  Wound care    Procedures:  None    Antibiotics:  IV vancomycin 2/9-present  IV Zosyn 2/9-present     Objective: BP 133/75 mmHg  Pulse 92  Temp(Src) 101.6 F (38.7 C) (Oral)  Resp 17  Ht 6' 4.38" (1.94 m)  Wt 99.9 kg (220 lb 3.8 oz)  BMI 26.54 kg/m2  SpO2 97%  Intake/Output Summary (Last 24 hours) at 11/03/15 1511 Last data filed at 11/03/15 1500  Gross per 24 hour  Intake 1421.25 ml  Output    251 ml  Net 1170.25 ml   Filed Weights   11/02/15 2100  Weight: 99.9 kg (220 lb 3.8 oz)    Exam:   General:  Alert and oriented 3, rigors    Cardiovascular: Regular rate and rhythm, S1-S2    Respiratory: Clear to auscultation bilaterally  Abdomen: Soft, nontender, nondistended, hypoactive bowel sounds  Musculoskeletal: 2+ pitting edema from the knees down with chronic ulcerations and wounds wrapped from midcalf down    Data Reviewed: Basic Metabolic Panel:  Recent Labs Lab 11/02/15 2015 11/02/15 2307 11/03/15 0528  NA 140  --  141  K 6.4* 6.5* 5.9*  CL 107  --  114*  CO2 20*  --  19*  GLUCOSE 63*  --  169*  BUN 56*  --  41*  CREATININE 2.23*  --  1.47*  CALCIUM 8.9  --  8.2*   Liver Function Tests:  Recent Labs Lab 11/02/15 2015  AST 35  ALT 42  ALKPHOS 179*  BILITOT 0.6  PROT 7.3  ALBUMIN  2.5*   No results for input(s): LIPASE, AMYLASE in the last 168 hours. No results for input(s): AMMONIA in the last 168 hours. CBC:  Recent Labs Lab 11/02/15 2015 11/03/15 0528  WBC 20.1* 17.7*  NEUTROABS 17.5*  --   HGB 7.1* 5.9*  HCT 23.0* 19.5*  MCV 61.5* 60.4*  PLT 309 268   Cardiac Enzymes:    Recent Labs Lab 11/02/15 2015  TROPONINI 0.04*   BNP (last 3 results) No results for input(s): BNP in the last 8760 hours.  ProBNP (last 3 results) No results for input(s): PROBNP in the last 8760  hours.  CBG:  Recent Labs Lab 11/03/15 0127 11/03/15 0226 11/03/15 0333 11/03/15 0611 11/03/15 0654  GLUCAP 145* 126* 146* 148* 191*    Recent Results (from the past 240 hour(s))  Blood Culture (routine x 2)     Status: None (Preliminary result)   Collection Time: 11/02/15  8:15 PM  Result Value Ref Range Status   Specimen Description BLOOD RIGHT ANTECUBITAL  Final   Special Requests BOTTLES DRAWN AEROBIC AND ANAEROBIC 5CC  Final   Culture NO GROWTH < 24 HOURS  Final   Report Status PENDING  Incomplete  Blood Culture (routine x 2)     Status: None (Preliminary result)   Collection Time: 11/02/15  8:35 PM  Result Value Ref Range Status   Specimen Description BLOOD LEFT ANTECUBITAL  Final   Special Requests BOTTLES DRAWN AEROBIC AND ANAEROBIC 3CC  Final   Culture NO GROWTH < 24 HOURS  Final   Report Status PENDING  Incomplete  C difficile quick scan w PCR reflex     Status: Abnormal   Collection Time: 11/03/15 12:47 AM  Result Value Ref Range Status   C Diff antigen POSITIVE (A) NEGATIVE Final   C Diff toxin NEGATIVE NEGATIVE Final   C Diff interpretation   Final    C. difficile present, but toxin not detected. This indicates colonization. In most cases, this does not require treatment. If patient has signs and symptoms consistent with colitis, consider treatment. Requires ENTERIC precautions.  Gastrointestinal Panel by PCR , Stool     Status: None   Collection Time: 11/03/15 12:47 AM  Result Value Ref Range Status   Campylobacter species NOT DETECTED NOT DETECTED Final   Plesimonas shigelloides NOT DETECTED NOT DETECTED Final   Salmonella species NOT DETECTED NOT DETECTED Final   Yersinia enterocolitica NOT DETECTED NOT DETECTED Final   Vibrio species NOT DETECTED NOT DETECTED Final   Vibrio cholerae NOT DETECTED NOT DETECTED Final   Enteroaggregative E coli (EAEC) NOT DETECTED NOT DETECTED Final   Enteropathogenic E coli (EPEC) NOT DETECTED NOT DETECTED Final    Enterotoxigenic E coli (ETEC) NOT DETECTED NOT DETECTED Final   Shiga like toxin producing E coli (STEC) NOT DETECTED NOT DETECTED Final   E. coli O157 NOT DETECTED NOT DETECTED Final   Shigella/Enteroinvasive E coli (EIEC) NOT DETECTED NOT DETECTED Final   Cryptosporidium NOT DETECTED NOT DETECTED Final   Cyclospora cayetanensis NOT DETECTED NOT DETECTED Final   Entamoeba histolytica NOT DETECTED NOT DETECTED Final   Giardia lamblia NOT DETECTED NOT DETECTED Final   Adenovirus F40/41 NOT DETECTED NOT DETECTED Final   Astrovirus NOT DETECTED NOT DETECTED Final   Norovirus GI/GII NOT DETECTED NOT DETECTED Final   Rotavirus A NOT DETECTED NOT DETECTED Final   Sapovirus (I, II, IV, and V) NOT DETECTED NOT DETECTED Final     Studies: Dg Tibia/fibula Left  11/02/2015  CLINICAL DATA:  60 year old male with a chronic wounds on the bilateral lower legs. Rule out subcutaneous gas. EXAM: LEFT TIBIA AND FIBULA - 2 VIEW COMPARISON:  Radiograph dated 04/26/2015 and right lower extremity radiograph dated 11/02/2015 FINDINGS: There is no acute fracture or dislocation. No periosteal reaction or erosive changes identified to suggest osteomyelitis. No soft tissue gas identified. There is diffuse subcutaneous soft tissue stranding and edema. Dressing noted over the mid calf. IMPRESSION: No acute osseous pathology. No Soft tissue gas. Electronically Signed   By: Anner Crete M.D.   On: 11/02/2015 21:48   Dg Tibia/fibula Right  11/02/2015  CLINICAL DATA:  Chronic right lower extremity wounds. Initial encounter. EXAM: RIGHT TIBIA AND FIBULA - 2 VIEW COMPARISON:  Right tibia/fibula radiographs performed 04/26/2015 FINDINGS: There is no evidence of fracture or dislocation. The tibia and fibula appear grossly intact. Known soft tissue wounds are not well characterized. The ankle joint is grossly unremarkable in appearance. The knee joint is grossly unremarkable, aside from a large enthesophyte arising at the upper pole  of the patella. A fabella is noted. IMPRESSION: No evidence of fracture or dislocation. Known soft tissue wounds are not well characterized. Electronically Signed   By: Garald Balding M.D.   On: 11/02/2015 21:48   US Renal  11/03/2015  CLINICAL DATA:  60 year old male with acute renal insufficiency EXAM: RENAL / URINARY TRACT ULTRASOUND COMPLETE COMPARISON:  CT dated 03/17/2015 FINDINGS: Right Kidney: Length: 11.1 cm. Echogenicity within normal limits. No mass or hydronephrosis visualized. Left Kidney: Length: 11.7 cm. Echogenicity within normal limits. No mass or hydronephrosis visualized. Bladder: The urinary bladder is collapsed and not visualized. IMPRESSION: No hydronephrosis or echogenic stone. Electronically Signed   By: Anner Crete M.D.   On: 11/03/2015 01:17   Dg Chest Port 1 View  11/02/2015  CLINICAL DATA:  60 year old male with sepsis EXAM: PORTABLE CHEST 1 VIEW COMPARISON:  Radiograph dated 03/26/2015 FINDINGS: Single-view of the chest demonstrate hypovolemic lungs. There are mild bibasilar atelectatic changes noted. There is no focal consolidation, pleural effusion, or pneumothorax. Minimal vascular prominence may represent mild congestion. The cardiac silhouette is within normal limits. The osseous structures appear unremarkable. IMPRESSION: No focal consolidation. Electronically Signed   By: Anner Crete M.D.   On: 11/02/2015 20:45    Scheduled Meds: . sodium chloride   Intravenous Once  . aspirin EC  81 mg Oral BH-q7a  . atorvastatin  80 mg Oral Daily  . famotidine  20 mg Oral BID  . feeding supplement (ENSURE ENLIVE)  237 mL Oral BID  . feeding supplement (PRO-STAT SUGAR FREE 64)  30 mL Oral TID WC  . ferrous sulfate  325 mg Oral TID WC  . insulin aspart  0-9 Units Subcutaneous TID WC  . mirtazapine  15 mg Oral QHS  . multivitamin with minerals  1 tablet Oral Daily  . oxybutynin  10 mg Oral QHS  . oxyCODONE  10 mg Oral Q12H  . piperacillin-tazobactam (ZOSYN)  IV  3.375  g Intravenous 3 times per day  . pregabalin  75 mg Oral BID  . sodium chloride flush  3 mL Intravenous Q12H  . tamsulosin  0.4 mg Oral QPC supper  . vancomycin  1,000 mg Intravenous Q24H    Continuous Infusions: . dextrose 5 % and 0.45% NaCl 75 mL/hr (11/03/15 1054)     Time spent: 35 minutes   [facility] Pager [phone] . If 7PM-7AM, please contact night-coverage at [email or web address], password [facility]  11/03/2015, 3:11 PM  LOS: 1 day

## 2015-11-03 NOTE — Consult Note (Signed)
Pharmacy Antibiotic Note  [name] is a 60 y.o. male admitted on 11/02/2015 with cellulitis.  Pharmacy has been consulted for vanc/zosyn dosing. Pt with SCr up to 2.23, normalized CrCl 30 ml/min. SCr was 0.6 ~40mos ago.  Plan: Vanc 1000mg  IV q24h Zosyn 3.375 g IV q8h - doses over 4 hours Will f/u micro data, renal function, and pt's clinical condition Vanc trough prn  Height: 6' 4.38" (194 cm) Weight: 220 lb 3.8 oz (99.9 kg) IBW/kg (Calculated) : 87.67  Temp (24hrs), Avg:100.3 F (37.9 C), Min:99.6 F (37.6 C), Max:101.2 F (38.4 C)   Recent Labs Lab 11/02/15 2015 11/02/15 2026 11/02/15 2308  WBC 20.1*  --   --   CREATININE 2.23*  --   --   LATICACIDVEN  --  1.84 1.7    Estimated Creatinine Clearance: 43.2 mL/min (by C-G formula based on Cr of 2.23).    Allergies  Allergen Reactions  . Other     Pt is a Jehovah Witness. No blood products.  . Sulfa Antibiotics     Causes flu-like symptom : sweating, chills, fever, body aches   Antimicrobials this admission: 2/9 Vanc  >>  2/9 Zosyn >>   Dose adjustments this admission: n/a  Microbiology results: 2/9 BCx:  2/9 UCx:   2/9 Cdiff PCR:    Thank you for allowing pharmacy to be a part of this patient's care.  Sherlon Handing, PharmD, BCPS Clinical pharmacist, pager [phone] 11/03/2015 12:18 AM

## 2015-11-03 NOTE — Progress Notes (Signed)
Inpatient Diabetes Program Recommendations  AACE/ADA: New Consensus Statement on Inpatient Glycemic Control (2015)  Target Ranges:  Prepandial:   less than 140 mg/dL      Peak postprandial:   less than 180 mg/dL (1-2 hours)      Critically ill patients:  140 - 180 mg/dL   Review of Glycemic Control:   Results for Catino, Johnhenry (MRN [id number]) as of 11/03/2015 12:00  Ref. Range 11/02/2015 23:16 11/02/2015 23:34 11/03/2015 01:27 11/03/2015 02:26 11/03/2015 03:33 11/03/2015 06:11 11/03/2015 06:54  Glucose-Capillary Latest Ref Range: 65-99 mg/dL 38 (LL) 262 (H) 145 (H) 126 (H) 146 (H) 148 (H) 191 (H)   Diabetes history: Type 2 diabetes Outpatient Diabetes medications: Novolog 70/30 25 units bid, Novolog sensitive correction Current orders for Inpatient glycemic control:  Novolog sensitive tid with meals  Inpatient Diabetes Program Recommendations:    May consider increasing Novolog correction to q 4 hours while patient is NPO.  Thanks, Adah Perl, RN, BC-ADM Inpatient Diabetes Coordinator Pager [phone]

## 2015-11-03 NOTE — Progress Notes (Signed)
Utilization Review Completed.  

## 2015-11-03 NOTE — Progress Notes (Signed)
CRITICAL VALUE ALERT  Critical value received:  Lactic acid 2.2  Date of notification:  11/03/15  Time of notification:  0330  Critical value read back:Yes.    Nurse who received alert:  Teola Bradley, RN  MD notified (1st page):  Dr. Blaine Hamper  Time of first page:  0350  MD notified (2nd page): n/a  Time of second page: n/a  Responding MD:  Dr. Blaine Hamper  Time MD responded:  [phone]

## 2015-11-03 NOTE — Care Management Note (Signed)
Case Management Note  Patient Details  Name: [name] MRN: [id number] Date of Birth: 06/30/1956  Subjective/Objective:  Pt is from [facility].  CSW consulted.                         Expected Discharge Plan:  [place]  In-House Referral:  Clinical Social Work  Discharge planning Services  CM Consult  Status of Service:  Completed, signed off  Girard Cooter, [place] 11/03/2015, 2:49 PM

## 2015-11-03 NOTE — Consult Note (Signed)
WOC wound consult note Reason for Consult: Second request today to see patient.  Patient seen this morning prior to transfer to [facility]  Bedside RN called and consult is discontinued. [facility] nursing team will not follow, but will remain available to this patient, the nursing and medical teams.  Please re-consult if needed. Thanks, Maudie Flakes, MSN, RN, [place], Arther Abbott  Pager# [phone]

## 2015-11-04 ENCOUNTER — Inpatient Hospital Stay (HOSPITAL_COMMUNITY): Payer: Medicare Other

## 2015-11-04 DIAGNOSIS — E875 Hyperkalemia: Secondary | ICD-10-CM

## 2015-11-04 DIAGNOSIS — M7989 Other specified soft tissue disorders: Secondary | ICD-10-CM

## 2015-11-04 LAB — URINE CULTURE

## 2015-11-04 LAB — CBC
HCT: 20.4 % — ABNORMAL LOW (ref 39.0–52.0)
HEMOGLOBIN: 6.2 g/dL — AB (ref 13.0–17.0)
MCH: 18.7 pg — AB (ref 26.0–34.0)
MCHC: 30.4 g/dL (ref 30.0–36.0)
MCV: 61.6 fL — AB (ref 78.0–100.0)
PLATELETS: 259 10*3/uL (ref 150–400)
RBC: 3.31 MIL/uL — AB (ref 4.22–5.81)
RDW: 19.3 % — ABNORMAL HIGH (ref 11.5–15.5)
WBC: 15.6 10*3/uL — AB (ref 4.0–10.5)

## 2015-11-04 LAB — GLUCOSE, CAPILLARY
GLUCOSE-CAPILLARY: 219 mg/dL — AB (ref 65–99)
GLUCOSE-CAPILLARY: 189 mg/dL — AB (ref 65–99)
GLUCOSE-CAPILLARY: 214 mg/dL — AB (ref 65–99)
Glucose-Capillary: 165 mg/dL — ABNORMAL HIGH (ref 65–99)

## 2015-11-04 LAB — BASIC METABOLIC PANEL
Anion gap: 7 (ref 5–15)
BUN: 24 mg/dL — AB (ref 6–20)
CO2: 22 mmol/L (ref 22–32)
Calcium: 8.1 mg/dL — ABNORMAL LOW (ref 8.9–10.3)
Chloride: 111 mmol/L (ref 101–111)
Creatinine, Ser: 0.99 mg/dL (ref 0.61–1.24)
GFR calc Af Amer: >60 mL/min (ref >60)
GLUCOSE: 275 mg/dL — AB (ref 65–99)
POTASSIUM: 5.5 mmol/L — AB (ref 3.5–5.1)
Sodium: 140 mmol/L (ref 135–145)

## 2015-11-04 LAB — OCCULT BLOOD X 1 CARD TO LAB, STOOL: Fecal Occult Bld: NEGATIVE

## 2015-11-04 MED ORDER — OXYCODONE HCL 5 MG PO TABS
5.0000 mg | ORAL_TABLET | ORAL | Status: DC | PRN
Start: 2015-11-04 — End: 2015-11-13
  Administered 2015-11-05 – 2015-11-13 (×13): 5 mg via ORAL
  Filled 2015-11-04 (×16): qty 1

## 2015-11-04 MED ORDER — SODIUM CHLORIDE 0.9 % IV SOLN
INTRAVENOUS | Status: DC
  Administered 2015-11-04 – 2015-11-11 (×7): via INTRAVENOUS

## 2015-11-04 MED ORDER — INSULIN ASPART 100 UNIT/ML ~~LOC~~ SOLN
0.0000 [IU] | Freq: Three times a day (TID) | SUBCUTANEOUS | Status: DC
  Administered 2015-11-04 – 2015-11-05 (×2): 2 [IU] via SUBCUTANEOUS
  Administered 2015-11-05 – 2015-11-06 (×3): 3 [IU] via SUBCUTANEOUS
  Administered 2015-11-06: 5 [IU] via SUBCUTANEOUS
  Administered 2015-11-06: 3 [IU] via SUBCUTANEOUS

## 2015-11-04 MED ORDER — OXYCODONE HCL ER 10 MG PO T12A: 10.0000 mg | EXTENDED_RELEASE_TABLET | Freq: Two times a day (BID) | ORAL | Status: DC

## 2015-11-04 MED ORDER — INSULIN ASPART 100 UNIT/ML ~~LOC~~ SOLN
0.0000 [IU] | Freq: Every day | SUBCUTANEOUS | Status: DC
  Administered 2015-11-04 – 2015-11-05 (×2): 2 [IU] via SUBCUTANEOUS

## 2015-11-04 MED ORDER — OXYCODONE HCL ER 10 MG PO T12A
10.0000 mg | EXTENDED_RELEASE_TABLET | Freq: Two times a day (BID) | ORAL | Status: DC
  Administered 2015-11-04 – 2015-11-07 (×6): 10 mg via ORAL
  Filled 2015-11-04 (×6): qty 1

## 2015-11-04 MED ORDER — PANTOPRAZOLE SODIUM 40 MG PO TBEC
40.0000 mg | DELAYED_RELEASE_TABLET | Freq: Two times a day (BID) | ORAL | Status: DC
  Administered 2015-11-04 – 2015-11-13 (×19): 40 mg via ORAL
  Filled 2015-11-04 (×19): qty 1

## 2015-11-04 NOTE — Progress Notes (Signed)
Pt had fever of 101.9. Pt was given PRN tylenol. Rechecked temp is 101.4 (axilliary). On-call provider Lamar Blinks, NP notified via text page. Awaiting orders.

## 2015-11-04 NOTE — Progress Notes (Signed)
*  PRELIMINARY RESULTS* Vascular Ultrasound Lower extremity venous duplex has been completed.  Preliminary findings: No evidence of DVT in visualized veins or baker's cyst. Bilateral enlarged inguinal lymph nodes.   Landry Mellow, RDMS, RVT  11/04/2015, 2:06 PM

## 2015-11-04 NOTE — Evaluation (Signed)
Physical Therapy Evaluation Patient Details Name: [name] MRN: [id number] DOB: [birth year] Today's Date: 11/04/2015   History of Present Illness  Patient is a 60 y/o male with hx of DM, chronic LE ulcers, degenerative hip, depression, HTN presents with low hemoglobin and sepsis secondary to lower extremity ulcers. Pt is a Jehovah's Witness so refusing blood transfusion. Is also noted to have hyperkalemia and acute kidney injury.   Clinical Impression  Patient presents with pain, decreased AROM/strength BLEs, decreased balance, activity tolerance and mobility due to above. Pt not able to sit upright on EOB due to pain in left hip and decreased hip flexion requiring Max A for balance. Demonstrates heavy posterior lean. Encouraged AROM/exercises of BLEs to improve strength/mobility. Increased time to perform all mobility. Pt walking with RW PTA and reports being independent with ADLs at [facility]. Would benefit from return to [facility] to maximize independence and mobility.    Follow Up Recommendations SNF    Equipment Recommendations  None recommended by PT    Recommendations for Other Services OT consult     Precautions / Restrictions Precautions Precautions: Fall Precaution Comments: gauze wrap on BLE ulcers. Restrictions Weight Bearing Restrictions: No      Mobility  Bed Mobility Overal bed mobility: Needs Assistance Bed Mobility: Supine to Sit     Supine to sit: Max assist;HOB elevated;+2 for physical assistance     General bed mobility comments: Increased time; assist with BLEs, scooting bottom and to elevate trunk. Heavy posterior listing as pt not able to flex at hips to sit upright due to pain and decreased AROM left hip (chronic).  Transfers                 General transfer comment: Deferred as pt not able to sit upright EOB safely without Max A.   Ambulation/Gait                Stairs            Wheelchair Mobility    Modified Rankin  (Stroke Patients Only)       Balance Overall balance assessment: Needs assistance Sitting-balance support: Feet supported;Bilateral upper extremity supported Sitting balance-Leahy Scale: Zero Sitting balance - Comments: Requires Mod-Max A for half sitting EOB; not able to flex left hip so leanign heavily posteriorly. Sat EOB ~8 mins. Postural control: Posterior lean                                   Pertinent Vitals/Pain Pain Assessment: Faces Faces Pain Scale: Hurts whole lot Pain Location: BLE ulcers Pain Descriptors / Indicators: Burning;Sore;Aching Pain Intervention(s): Monitored during session;Limited activity within patient's tolerance;Repositioned;Patient requesting pain meds-RN notified    Home Living Family/patient expects to be discharged to:: Skilled nursing facility [facility]. )                      Prior Function Level of Independence: Needs assistance   Gait / Transfers Assistance Needed: Uses RW for mobility at nursing home. Walks everyday.   ADL's / Homemaking Assistance Needed: Reports mostly being independent for ADls.         Hand Dominance        Extremity/Trunk Assessment   Upper Extremity Assessment: Defer to OT evaluation           Lower Extremity Assessment: RLE deficits/detail;LLE deficits/detail RLE Deficits / Details: Able to perform SLR with increased  effort. Difficulty with knee flexion.  LLE Deficits / Details: Able to perform SLR with increased effort/difficulty. Not able to flex at hip to sit fully upright on EOB due to pain. Decreased knee flexion as well due to pain/stiffness.     Communication   Communication: No difficulties  Cognition Arousal/Alertness: Awake/alert Behavior During Therapy: WFL for tasks assessed/performed Overall Cognitive Status: No family/caregiver present to determine baseline cognitive functioning Area of Impairment: Orientation Orientation Level: Disoriented to;Time              General Comments: "what day is it?" Needs to be re-oriented to date and time. Otherwise cognition seems WFL.    General Comments General comments (skin integrity, edema, etc.): Discussed importance of LE exercises/AROM due to stiffness and weakness from being in bed.     Exercises General Exercises - Lower Extremity Ankle Circles/Pumps: Both;10 reps;Supine Straight Leg Raises: Both;5 reps;Supine      Assessment/Plan    PT Assessment Patient needs continued PT services  PT Diagnosis Difficulty walking;Acute pain;Generalized weakness   PT Problem List Decreased strength;Pain;Decreased balance;Decreased mobility;Decreased activity tolerance;Decreased cognition;Decreased range of motion  PT Treatment Interventions Functional mobility training;Therapeutic activities;Therapeutic exercise;Patient/family education;Gait training;Balance training;DME instruction   PT Goals (Current goals can be found in the Care Plan section) Acute Rehab PT Goals Patient Stated Goal: to be able to walk PT Goal Formulation: With patient Time For Goal Achievement: 11/18/15 Potential to Achieve Goals: Fair    Frequency Min 2X/week   Barriers to discharge        Co-evaluation               End of Session   Activity Tolerance: Patient limited by fatigue;Patient limited by pain Patient left: in bed;with bed alarm set;with call bell/phone within reach Nurse Communication: Mobility status;Patient requests pain meds         Time: 0835-0903 PT Time Calculation (min) (ACUTE ONLY): 28 min   Charges:   PT Evaluation $PT Eval Moderate Complexity: 1 Procedure PT Treatments $Therapeutic Activity: 8-22 mins   PT G Codes:        Kristell Wooding A Shataria Crist 11/04/2015, 11:02 AM Wray Kearns, PT, DPT [phone]

## 2015-11-04 NOTE — Progress Notes (Signed)
PROGRESS NOTE  [name] [id number] DOB: Dec 16, 1955 DOA: 11/02/2015 PCP: Charlsie Merles, MD  HPI/Recap of past 57 hours: 60 year old male from a nursing facility with history of diabetes mellitus and chronic lower extremity ulcers admitted for low hemoglobin  (reportedly at 5) and sepsis secondary to lower extremity ulcers., Getting courses the patient is a Jehovah's Witness and refuses transfusions of blood. Patient noted to have hyperkalemia and acute kidney injury.  Monitor closely and hemoglobin has remained stable around 6.   Today, blood pressure more stable. Hemoglobin unchanged. Patient's biggest complaint is of bilateral lower extremity pain. Did spike fever despite broad-spectrum antibiotics yesterday.    Assessment/Plan: Principal Problem:   Sepsis secondary to Cellulitis of bilateral lower extremities from chronic ulcers: Noted temperature spike continue IV fluids.  : Patient meets criteria with lactic acidosis, leukocytosis and fever plus hypotension with sources bilateral lower extremities. Continue broad-spectrum IV antibiotics plus fluids. Wound care to see. If fevers persist, will consider CT of lower extremities.  added scheduled OxyContin for pain control.  Active Problems:  acute kidney injury: Patient's renal function normal prior to this admission, however last checked 6 months ago. Much of this could be from acute blood loss anemia. Responded well to IV fluids and renal function much improved from yesterday. Continue IV fluids    DM (diabetes mellitus), secondary, uncontrolled, with peripheral vascular complications ([place]): CBGs  starting to trend upward. We'll tighten coverage    Microcytic anemia/Acute blood loss anemia: Workup in progress. As mentioned before, patient is Jehovah's Witness. At this time no signs of active bleeding. Hemoglobin has ranged around 7-8 likely from chronic disease and acutely low perhaps from GI bleed. Patient is on daily aspirin plus  Mobic. Have stopped these for now , Add PPI     Hyperkalemia: Improved. Treated with Kayexalate    Hypoglycemia   Code Status: Full code  Family Communication: Left message with ex-wife    Disposition Plan: Overall stable, will transfer to telemetry bed. Anticipate in hospital at least for a few more days    Consultants:  Wound care    Procedures:  None    Antibiotics:  IV vancomycin 2/9-present  IV Zosyn 2/9-present     Objective: BP 140/69 mmHg  Pulse 97  Temp(Src) 101.8 F (38.8 C) (Oral)  Resp 17  Ht 6' 4.38" (1.94 m)  Wt 99.9 kg (220 lb 3.8 oz)  BMI 26.54 kg/m2  SpO2 90%  Intake/Output Summary (Last 24 hours) at 11/04/15 1405 Last data filed at 11/04/15 0900  Gross per 24 hour  Intake   2229 ml  Output   1050 ml  Net   1179 ml   Filed Weights   11/02/15 2100  Weight: 99.9 kg (220 lb 3.8 oz)    Exam:   General:  Alert and oriented , mild distress secondary to chronic lower extremity pain  Cardiovascular: Regular rate and rhythm, S1-S2    Respiratory: Clear to auscultation bilaterally  Abdomen: Soft, nontender, nondistended, hypoactive bowel sounds  Musculoskeletal: 2+ pitting edema from the knees down with chronic ulcerations and wounds wrapped from midcalf down    Data Reviewed: Basic Metabolic Panel:  Recent Labs Lab 11/02/15 2015 11/02/15 2307 11/03/15 0528 11/04/15 0216  NA 140  --  141 140  K 6.4* 6.5* 5.9* 5.5*  CL 107  --  114* 111  CO2 20*  --  19* 22  GLUCOSE 63*  --  169* 275*  BUN 56*  --  41*  24*  CREATININE 2.23*  --  1.47* 0.99  CALCIUM 8.9  --  8.2* 8.1*   Liver Function Tests:  Recent Labs Lab 11/02/15 2015  AST 35  ALT 42  ALKPHOS 179*  BILITOT 0.6  PROT 7.3  ALBUMIN 2.5*   No results for input(s): LIPASE, AMYLASE in the last 168 hours. No results for input(s): AMMONIA in the last 168 hours. CBC:  Recent Labs Lab 11/02/15 2015 11/03/15 0528 11/03/15 1429 11/04/15 0216  WBC 20.1* 17.7*  --   15.6*  NEUTROABS 17.5*  --   --   --   HGB 7.1* 5.9* 6.1* 6.2*  HCT 23.0* 19.5* 20.5* 20.4*  MCV 61.5* 60.4*  --  61.6*  PLT 309 268  --  259   Cardiac Enzymes:    Recent Labs Lab 11/02/15 2015  TROPONINI 0.04*   BNP (last 3 results) No results for input(s): BNP in the last 8760 hours.  ProBNP (last 3 results) No results for input(s): PROBNP in the last 8760 hours.  CBG:  Recent Labs Lab 11/03/15 1244 11/03/15 1649 11/03/15 2121 11/04/15 0748 11/04/15 1156  GLUCAP 119* 116* 201* 165* 219*    Recent Results (from the past 240 hour(s))  Blood Culture (routine x 2)     Status: None (Preliminary result)   Collection Time: 11/02/15  8:15 PM  Result Value Ref Range Status   Specimen Description BLOOD RIGHT ANTECUBITAL  Final   Special Requests BOTTLES DRAWN AEROBIC AND ANAEROBIC 5CC  Final   Culture NO GROWTH 2 DAYS  Final   Report Status PENDING  Incomplete  Blood Culture (routine x 2)     Status: None (Preliminary result)   Collection Time: 11/02/15  8:35 PM  Result Value Ref Range Status   Specimen Description BLOOD LEFT ANTECUBITAL  Final   Special Requests BOTTLES DRAWN AEROBIC AND ANAEROBIC 3CC  Final   Culture NO GROWTH 2 DAYS  Final   Report Status PENDING  Incomplete  C difficile quick scan w PCR reflex     Status: Abnormal   Collection Time: 11/03/15 12:47 AM  Result Value Ref Range Status   C Diff antigen POSITIVE (A) NEGATIVE Final   C Diff toxin NEGATIVE NEGATIVE Final   C Diff interpretation   Final    C. difficile present, but toxin not detected. This indicates colonization. In most cases, this does not require treatment. If patient has signs and symptoms consistent with colitis, consider treatment. Requires ENTERIC precautions.  Gastrointestinal Panel by PCR , Stool     Status: None   Collection Time: 11/03/15 12:47 AM  Result Value Ref Range Status   Campylobacter species NOT DETECTED NOT DETECTED Final   Plesimonas shigelloides NOT DETECTED NOT  DETECTED Final   Salmonella species NOT DETECTED NOT DETECTED Final   Yersinia enterocolitica NOT DETECTED NOT DETECTED Final   Vibrio species NOT DETECTED NOT DETECTED Final   Vibrio cholerae NOT DETECTED NOT DETECTED Final   Enteroaggregative E coli (EAEC) NOT DETECTED NOT DETECTED Final   Enteropathogenic E coli (EPEC) NOT DETECTED NOT DETECTED Final   Enterotoxigenic E coli (ETEC) NOT DETECTED NOT DETECTED Final   Shiga like toxin producing E coli (STEC) NOT DETECTED NOT DETECTED Final   E. coli O157 NOT DETECTED NOT DETECTED Final   Shigella/Enteroinvasive E coli (EIEC) NOT DETECTED NOT DETECTED Final   Cryptosporidium NOT DETECTED NOT DETECTED Final   Cyclospora cayetanensis NOT DETECTED NOT DETECTED Final   Entamoeba histolytica NOT DETECTED NOT  DETECTED Final   Giardia lamblia NOT DETECTED NOT DETECTED Final   Adenovirus F40/41 NOT DETECTED NOT DETECTED Final   Astrovirus NOT DETECTED NOT DETECTED Final   Norovirus GI/GII NOT DETECTED NOT DETECTED Final   Rotavirus A NOT DETECTED NOT DETECTED Final   Sapovirus (I, II, IV, and V) NOT DETECTED NOT DETECTED Final     Studies: No results found.  Scheduled Meds: . sodium chloride   Intravenous Once  . atorvastatin  80 mg Oral Daily  . famotidine  20 mg Oral BID  . feeding supplement (ENSURE ENLIVE)  237 mL Oral BID  . feeding supplement (PRO-STAT SUGAR FREE 64)  30 mL Oral TID WC  . ferrous sulfate  325 mg Oral TID WC  . insulin aspart  0-9 Units Subcutaneous TID WC  . mirtazapine  15 mg Oral QHS  . multivitamin with minerals  1 tablet Oral Daily  . oxybutynin  10 mg Oral QHS  . oxyCODONE  10 mg Oral Q12H  . piperacillin-tazobactam (ZOSYN)  IV  3.375 g Intravenous 3 times per day  . pregabalin  75 mg Oral BID  . sodium chloride flush  3 mL Intravenous Q12H  . tamsulosin  0.4 mg Oral QPC supper  . vancomycin  1,000 mg Intravenous Q24H    Continuous Infusions: . dextrose 5 % and 0.45% NaCl 75 mL/hr at 11/04/15 0500      Time spent: 25 minutes   [place] [facility] Pager [phone] . If 7PM-7AM, please contact night-coverage at [email or web address], password [facility] 11/04/2015, 2:05 PM  LOS: 2 days

## 2015-11-05 ENCOUNTER — Encounter (HOSPITAL_COMMUNITY): Payer: Self-pay | Admitting: Radiology

## 2015-11-05 ENCOUNTER — Inpatient Hospital Stay (HOSPITAL_COMMUNITY): Payer: Medicare Other

## 2015-11-05 LAB — BASIC METABOLIC PANEL
ANION GAP: 11 (ref 5–15)
BUN: 15 mg/dL (ref 6–20)
CHLORIDE: 109 mmol/L (ref 101–111)
CO2: 21 mmol/L — AB (ref 22–32)
Calcium: 8.2 mg/dL — ABNORMAL LOW (ref 8.9–10.3)
Creatinine, Ser: 0.95 mg/dL (ref 0.61–1.24)
GFR calc non Af Amer: >60 mL/min (ref >60)
GLUCOSE: 219 mg/dL — AB (ref 65–99)
Potassium: 4.7 mmol/L (ref 3.5–5.1)
Sodium: 141 mmol/L (ref 135–145)

## 2015-11-05 LAB — CBC
HEMATOCRIT: 20.1 % — AB (ref 39.0–52.0)
HEMOGLOBIN: 6 g/dL — AB (ref 13.0–17.0)
MCH: 17.9 pg — ABNORMAL LOW (ref 26.0–34.0)
MCHC: 29.9 g/dL — ABNORMAL LOW (ref 30.0–36.0)
MCV: 60 fL — AB (ref 78.0–100.0)
Platelets: 268 10*3/uL (ref 150–400)
RBC: 3.35 MIL/uL — ABNORMAL LOW (ref 4.22–5.81)
RDW: 19 % — ABNORMAL HIGH (ref 11.5–15.5)
WBC: 16.1 10*3/uL — AB (ref 4.0–10.5)

## 2015-11-05 LAB — LACTIC ACID, PLASMA
LACTIC ACID, VENOUS: 0.8 mmol/L (ref 0.5–2.0)
Lactic Acid, Venous: 0.8 mmol/L (ref 0.5–2.0)

## 2015-11-05 LAB — GLUCOSE, CAPILLARY
GLUCOSE-CAPILLARY: 184 mg/dL — AB (ref 65–99)
GLUCOSE-CAPILLARY: 209 mg/dL — AB (ref 65–99)
GLUCOSE-CAPILLARY: 233 mg/dL — AB (ref 65–99)
Glucose-Capillary: 242 mg/dL — ABNORMAL HIGH (ref 65–99)

## 2015-11-05 LAB — VANCOMYCIN, TROUGH: VANCOMYCIN TR: 5 ug/mL — AB (ref 10.0–20.0)

## 2015-11-05 LAB — MRSA PCR SCREENING: MRSA by PCR: NEGATIVE

## 2015-11-05 MED ORDER — IOHEXOL 300 MG/ML  SOLN
100.0000 mL | Freq: Once | INTRAMUSCULAR | Status: AC | PRN
  Administered 2015-11-05: 100 mL via INTRAVENOUS

## 2015-11-05 MED ORDER — VANCOMYCIN HCL IN DEXTROSE 1-5 GM/200ML-% IV SOLN
1000.0000 mg | Freq: Three times a day (TID) | INTRAVENOUS | Status: DC
  Administered 2015-11-05 – 2015-11-08 (×8): 1000 mg via INTRAVENOUS
  Filled 2015-11-05 (×10): qty 200

## 2015-11-05 NOTE — Progress Notes (Signed)
PROGRESS NOTE  [name] [id number] DOB: 1955/11/11 DOA: 11/02/2015 PCP: Charlsie Merles, MD  HPI/Recap of past 9 hours: 60 year old male from a nursing facility with history of diabetes mellitus and chronic lower extremity ulcers admitted for low hemoglobin  (reportedly at 5) and sepsis secondary to lower extremity ulcers., Getting courses the patient is a Jehovah's Witness and refuses transfusions of blood. Patient noted to have hyperkalemia and acute kidney injury.  Monitored closely and hemoglobin has remained stable around 6.   Patient's biggest issue is that despite broad-spectrum antibiotics, he continued to spike fevers. Urine and chest x-ray and exam unrevealing. patient himself complains of chronic lower extremity pain from his ulcers. No new symptoms. CT scan of lower extremities ordered and is pending.  Still having hyperkalemia.   Assessment/Plan: Principal Problem:   Sepsis secondary to Cellulitis of bilateral lower extremities from chronic ulcers: Noted temperature spike continue IV fluids.  : Patient meets criteria with lactic acidosis, leukocytosis and fever plus hypotension with sources bilateral lower extremities. Continue broad-spectrum IV antibiotics plus fluids. Added scheduled OxyContin for pain control.  Active Problems:  acute kidney injury: Patient's renal function normal prior to this admission, however last checked 6 months ago. Much of this could be from acute blood loss anemia. Responded well to IV fluids and renal function much improved from yesterday. Continue IV fluids    DM (diabetes mellitus), secondary, uncontrolled, with peripheral vascular complications ([place]): CBGs  starting to trend upward. We'll tighten coverage    Microcytic anemia/Acute blood loss anemia: Workup in progress. As mentioned before, patient is Jehovah's Witness. At this time no signs of active bleeding. Hemoglobin has ranged around 7-8 likely from chronic disease and acutely low  perhaps from GI bleed. Patient is on daily aspirin plus Mobic. Have stopped these for now , Add PPI     Hyperkalemia: Improved. Treated with Kayexalate    Hypoglycemia   Code Status: Full code  Family Communication: Left message with ex-wife yesterday.   Disposition Plan: Overall stable.     Consultants:  Wound care    Procedures:  None    Antibiotics:  IV vancomycin 2/9-present  IV Zosyn 2/9-present     Objective: BP 135/55 mmHg  Pulse 95  Temp(Src) 100.4 F (38 C) (Oral)  Resp 18  Ht 6\' 4"  (1.93 m)  Wt 99.9 kg (220 lb 3.8 oz)  BMI 26.54 kg/m2  SpO2 93%  Intake/Output Summary (Last 24 hours) at 11/05/15 1355 Last data filed at 11/05/15 0940  Gross per 24 hour  Intake 2178.75 ml  Output   3250 ml  Net -1071.25 ml   Filed Weights   11/02/15 2100  Weight: 99.9 kg (220 lb 3.8 oz)    Exam:  No change from previous day.  General:  Alert and oriented , mild distress secondary to chronic lower extremity pain  Cardiovascular: Regular rate and rhythm, S1-S2    Respiratory: Clear to auscultation bilaterally  Abdomen: Soft, nontender, nondistended, hypoactive bowel sounds  Musculoskeletal: 2+ pitting edema from the knees down with chronic ulcerations and wounds wrapped from midcalf down    Data Reviewed: Basic Metabolic Panel:  Recent Labs Lab 11/02/15 2015 11/02/15 2307 11/03/15 0528 11/04/15 0216 11/05/15 0400  NA 140  --  141 140 141  K 6.4* 6.5* 5.9* 5.5* 4.7  CL 107  --  114* 111 109  CO2 20*  --  19* 22 21*  GLUCOSE 63*  --  169* 275* 219*  BUN 56*  --  41* 24* 15  CREATININE 2.23*  --  1.47* 0.99 0.95  CALCIUM 8.9  --  8.2* 8.1* 8.2*   Liver Function Tests:  Recent Labs Lab 11/02/15 2015  AST 35  ALT 42  ALKPHOS 179*  BILITOT 0.6  PROT 7.3  ALBUMIN 2.5*   No results for input(s): LIPASE, AMYLASE in the last 168 hours. No results for input(s): AMMONIA in the last 168 hours. CBC:  Recent Labs Lab 11/02/15 2015  11/03/15 0528 11/03/15 1429 11/04/15 0216 11/05/15 0400  WBC 20.1* 17.7*  --  15.6* 16.1*  NEUTROABS 17.5*  --   --   --   --   HGB 7.1* 5.9* 6.1* 6.2* 6.0*  HCT 23.0* 19.5* 20.5* 20.4* 20.1*  MCV 61.5* 60.4*  --  61.6* 60.0*  PLT 309 268  --  259 268   Cardiac Enzymes:    Recent Labs Lab 11/02/15 2015  TROPONINI 0.04*   BNP (last 3 results) No results for input(s): BNP in the last 8760 hours.  ProBNP (last 3 results) No results for input(s): PROBNP in the last 8760 hours.  CBG:  Recent Labs Lab 11/04/15 1156 11/04/15 1839 11/04/15 2057 11/05/15 0751 11/05/15 1216  GLUCAP 219* 189* 214* 184* 209*    Recent Results (from the past 240 hour(s))  Blood Culture (routine x 2)     Status: None (Preliminary result)   Collection Time: 11/02/15  8:15 PM  Result Value Ref Range Status   Specimen Description BLOOD RIGHT ANTECUBITAL  Final   Special Requests BOTTLES DRAWN AEROBIC AND ANAEROBIC 5CC  Final   Culture NO GROWTH 3 DAYS  Final   Report Status PENDING  Incomplete  Blood Culture (routine x 2)     Status: None (Preliminary result)   Collection Time: 11/02/15  8:35 PM  Result Value Ref Range Status   Specimen Description BLOOD LEFT ANTECUBITAL  Final   Special Requests BOTTLES DRAWN AEROBIC AND ANAEROBIC 3CC  Final   Culture NO GROWTH 3 DAYS  Final   Report Status PENDING  Incomplete  Urine culture     Status: None   Collection Time: 11/02/15 10:58 PM  Result Value Ref Range Status   Specimen Description URINE, CLEAN CATCH  Final   Special Requests NONE  Final   Culture MULTIPLE SPECIES PRESENT, SUGGEST RECOLLECTION  Final   Report Status 11/04/2015 FINAL  Final  C difficile quick scan w PCR reflex     Status: Abnormal   Collection Time: 11/03/15 12:47 AM  Result Value Ref Range Status   C Diff antigen POSITIVE (A) NEGATIVE Final   C Diff toxin NEGATIVE NEGATIVE Final   C Diff interpretation   Final    C. difficile present, but toxin not detected. This  indicates colonization. In most cases, this does not require treatment. If patient has signs and symptoms consistent with colitis, consider treatment. Requires ENTERIC precautions.  Gastrointestinal Panel by PCR , Stool     Status: None   Collection Time: 11/03/15 12:47 AM  Result Value Ref Range Status   Campylobacter species NOT DETECTED NOT DETECTED Final   Plesimonas shigelloides NOT DETECTED NOT DETECTED Final   Salmonella species NOT DETECTED NOT DETECTED Final   Yersinia enterocolitica NOT DETECTED NOT DETECTED Final   Vibrio species NOT DETECTED NOT DETECTED Final   Vibrio cholerae NOT DETECTED NOT DETECTED Final   Enteroaggregative E coli (EAEC) NOT DETECTED NOT DETECTED Final   Enteropathogenic E coli (EPEC) NOT DETECTED NOT DETECTED Final  Enterotoxigenic E coli (ETEC) NOT DETECTED NOT DETECTED Final   Shiga like toxin producing E coli (STEC) NOT DETECTED NOT DETECTED Final   E. coli O157 NOT DETECTED NOT DETECTED Final   Shigella/Enteroinvasive E coli (EIEC) NOT DETECTED NOT DETECTED Final   Cryptosporidium NOT DETECTED NOT DETECTED Final   Cyclospora cayetanensis NOT DETECTED NOT DETECTED Final   Entamoeba histolytica NOT DETECTED NOT DETECTED Final   Giardia lamblia NOT DETECTED NOT DETECTED Final   Adenovirus F40/41 NOT DETECTED NOT DETECTED Final   Astrovirus NOT DETECTED NOT DETECTED Final   Norovirus GI/GII NOT DETECTED NOT DETECTED Final   Rotavirus A NOT DETECTED NOT DETECTED Final   Sapovirus (I, II, IV, and V) NOT DETECTED NOT DETECTED Final  MRSA PCR Screening     Status: None   Collection Time: 11/04/15  9:23 AM  Result Value Ref Range Status   MRSA by PCR NEGATIVE NEGATIVE Final    Comment:        The GeneXpert MRSA Assay (FDA approved for NASAL specimens only), is one component of a comprehensive MRSA colonization surveillance program. It is not intended to diagnose MRSA infection nor to guide or monitor treatment for MRSA infections.       Studies: No results found.  Scheduled Meds: . sodium chloride   Intravenous Once  . atorvastatin  80 mg Oral Daily  . feeding supplement (ENSURE ENLIVE)  237 mL Oral BID  . feeding supplement (PRO-STAT SUGAR FREE 64)  30 mL Oral TID WC  . ferrous sulfate  325 mg Oral TID WC  . insulin aspart  0-5 Units Subcutaneous QHS  . insulin aspart  0-9 Units Subcutaneous TID WC  . mirtazapine  15 mg Oral QHS  . multivitamin with minerals  1 tablet Oral Daily  . oxybutynin  10 mg Oral QHS  . oxyCODONE  10 mg Oral Q12H  . pantoprazole  40 mg Oral BID  . piperacillin-tazobactam (ZOSYN)  IV  3.375 g Intravenous 3 times per day  . pregabalin  75 mg Oral BID  . sodium chloride flush  3 mL Intravenous Q12H  . tamsulosin  0.4 mg Oral QPC supper  . vancomycin  1,000 mg Intravenous Q24H    Continuous Infusions: . sodium chloride 75 mL/hr at 11/05/15 0557     Time spent: 25 minutes   [place] [facility] Pager [phone] . If 7PM-7AM, please contact night-coverage at [email or web address], password [facility] 11/05/2015, 1:55 PM  LOS: 3 days

## 2015-11-05 NOTE — Progress Notes (Signed)
ANTIBIOTIC CONSULT NOTE   Pharmacy Consult for Vancomycin Indication: cellulitis  Allergies  Allergen Reactions  . Other     Pt is a Jehovah Witness. No blood products.  . Sulfa Antibiotics     Causes flu-like symptom : sweating, chills, fever, body aches    Patient Measurements: Height: 6\' 4"  (193 cm) Weight: 220 lb 3.8 oz (99.9 kg) IBW/kg (Calculated) : 86.8 Adjusted Body Weight:    Vital Signs: Temp: 100.4 F (38 C) (02/12 1419) Temp Source: Oral (02/12 1419) BP: 121/62 mmHg (02/12 1419) Pulse Rate: 98 (02/12 1419) Intake/Output from previous day: 02/11 0701 - 02/12 0700 In: 1928.8 [P.O.:390; I.V.:1388.8; IV Piggyback:150] Out: 2150 [Urine:2150] Intake/Output from this shift:    Labs:  Recent Labs  11/03/15 0104 11/03/15 0528 11/03/15 1429 11/04/15 0216 11/05/15 0400  WBC  --  17.7*  --  15.6* 16.1*  HGB  --  5.9* 6.1* 6.2* 6.0*  PLT  --  268  --  259 268  LABCREA 155.63  --   --   --   --   CREATININE  --  1.47*  --  0.99 0.95   Estimated Creatinine Clearance: 100.3 mL/min (by C-G formula based on Cr of 0.95).  Recent Labs  11/05/15 1903  [place] 5*      Medical History: Past Medical History  Diagnosis Date  . Hypertension   . Chronic hip pain   . GERD (gastroesophageal reflux disease)   . Depression   . Hypercholesterolemia   . Type II diabetes mellitus ([place])   . Pneumonia 1960's X 1  . Anemia   . Sleep apnea     "couldn't wear the mask" (04/26/2015)  . Thalassemia minor   . Arthritis     "left hip" (04/26/2015)  . Cellulitis and abscess of leg hositalized 04/25/2015    left    Assessment:  ID: Vanc/Zosyn for cellulitis - Tmax 101.9, WBC 16.1. Renal function has normalized from admission therefore Vanco trough only 5.  Vanc  2/9>>  --2/12 VT=5 Zosyn 2/9 >>   2/9 BCx: ngtd 2/9 UCx:  mult sp, recollect 2/9 Cdiff PCR:  (+)Ag, (-)toxin- colonization 2/10 GI PCR- all neg 2/11 MRSA PCR: neg  Goal of Therapy:  Vancomycin trough  level 10-15 mcg/ml  Plan:  Increase Vancomycin to 1g IV q8hr. Recheck trough after 3-5 doses at [employer]. Alford Highland, PharmD, BCPS Clinical Staff Pharmacist Pager [phone]  Eilene Ghazi Stillinger 11/05/2015,8:05 PM

## 2015-11-06 LAB — GLUCOSE, CAPILLARY
GLUCOSE-CAPILLARY: 242 mg/dL — AB (ref 65–99)
GLUCOSE-CAPILLARY: 266 mg/dL — AB (ref 65–99)
GLUCOSE-CAPILLARY: 226 mg/dL — AB (ref 65–99)

## 2015-11-06 MED ORDER — INSULIN ASPART 100 UNIT/ML ~~LOC~~ SOLN
0.0000 [IU] | Freq: Every day | SUBCUTANEOUS | Status: DC
  Administered 2015-11-06 – 2015-11-11 (×5): 2 [IU] via SUBCUTANEOUS
  Administered 2015-11-12: 3 [IU] via SUBCUTANEOUS

## 2015-11-06 MED ORDER — INSULIN ASPART 100 UNIT/ML ~~LOC~~ SOLN
0.0000 [IU] | Freq: Three times a day (TID) | SUBCUTANEOUS | Status: DC
  Administered 2015-11-07: 11 [IU] via SUBCUTANEOUS
  Administered 2015-11-07: 7 [IU] via SUBCUTANEOUS
  Administered 2015-11-07: 15 [IU] via SUBCUTANEOUS
  Administered 2015-11-08: 11 [IU] via SUBCUTANEOUS
  Administered 2015-11-08: 7 [IU] via SUBCUTANEOUS
  Administered 2015-11-08: 11 [IU] via SUBCUTANEOUS
  Administered 2015-11-09 (×2): 7 [IU] via SUBCUTANEOUS
  Administered 2015-11-09 – 2015-11-10 (×3): 11 [IU] via SUBCUTANEOUS
  Administered 2015-11-10: 7 [IU] via SUBCUTANEOUS
  Administered 2015-11-11: 11 [IU] via SUBCUTANEOUS
  Administered 2015-11-11: 4 [IU] via SUBCUTANEOUS
  Administered 2015-11-11: 15 [IU] via SUBCUTANEOUS
  Administered 2015-11-12: 20 [IU] via SUBCUTANEOUS
  Administered 2015-11-12: 7 [IU] via SUBCUTANEOUS
  Administered 2015-11-12 – 2015-11-13 (×2): 11 [IU] via SUBCUTANEOUS
  Administered 2015-11-13: 4 [IU] via SUBCUTANEOUS

## 2015-11-06 MED ORDER — INSULIN GLARGINE 100 UNIT/ML ~~LOC~~ SOLN
15.0000 [IU] | Freq: Every day | SUBCUTANEOUS | Status: DC
  Administered 2015-11-06 – 2015-11-07 (×2): 15 [IU] via SUBCUTANEOUS
  Filled 2015-11-06 (×3): qty 0.15

## 2015-11-06 NOTE — Evaluation (Signed)
Occupational Therapy Evaluation Patient Details Name: [name] MRN: [id number] DOB: 1956-02-09 Today's Date: 11/06/2015    History of Present Illness Patient is a 60 y.o. male with hx of DM, hyperlipidemia, iron deficiency anemia, chronic LE ulcers, degenerative hip, depression, HTN presented with low hemoglobin and sepsis secondary to lower extremity ulcers. Pt is a Jehovah's Witness so refusing blood transfusion. Is also noted to have hyperkalemia and acute kidney injury.    Clinical Impression   Pt admitted with above. Pt reports assist with LB ADLs, PTA. Feel pt will benefit from acute OT to increase independence prior to d/c. Recommending SNF for rehab.     Follow Up Recommendations  SNF    Equipment Recommendations  Other (comment) (AE; defer to next venue)    Recommendations for Other Services       Precautions / Restrictions Precautions Precautions: Fall Precaution Comments: gauze wrap on BLE ulcers. Restrictions Weight Bearing Restrictions: No      Mobility Bed Mobility Overal bed mobility: Needs Assistance Bed Mobility: Sit to Supine;Supine to Sit     Supine to sit: Max assist Sit to supine: Mod assist   General bed mobility comments: assisted with bilateral LEs when returning to bed. trendelenburg position used to scoot HOB and pt able to pull self up with arms/rails. Assist with trunk and LEs to come to sitting position.   Transfers                 General transfer comment: not assessed    Balance     Posterior lean sitting EOB-assist given.                                        ADL Overall ADL's : Needs assistance/impaired     Grooming: Set up;Bed level               Lower Body Dressing: Total assistance;Sitting/lateral leans;+2 for physical assistance Lower Body Dressing Details (indicate cue type and reason): sitting EOB               General ADL Comments: Pt sat EOB but was leaning posteriorly.   Explained they make a larger size sock aid.     Vision     Perception     Praxis      Pertinent Vitals/Pain Pain Assessment: 0-10 Pain Score:  (10+ in RLE and 9 in LLE) Pain Location: bilateral LEs Pain Intervention(s): Monitored during session;Repositioned     Hand Dominance     Extremity/Trunk Assessment Upper Extremity Assessment Upper Extremity Assessment: Overall WFL for tasks assessed   Lower Extremity Assessment Lower Extremity Assessment: Defer to PT evaluation       Communication Communication Communication: No difficulties   Cognition Arousal/Alertness: Awake/alert Behavior During Therapy: WFL for tasks assessed/performed Overall Cognitive Status: Within Functional Limits for tasks assessed                     General Comments       Exercises       Shoulder Instructions      Home Living Family/patient expects to be discharged to:: Skilled nursing facility                                 Additional Comments: reports he has sock aid but sounds like it is  too small, and has reacher but reports it doesn't work well      Prior Functioning/Environment Level of Independence: Needs assistance  Gait / Transfers Assistance Needed: Uses RW for mobility at nursing home. Walks everyday.  ADL's / Homemaking Assistance Needed: assist with LB bathing and dressing        OT Diagnosis: Acute pain;Generalized weakness   OT Problem List: Decreased activity tolerance;Impaired balance (sitting and/or standing);Pain;Increased edema;Decreased knowledge of use of DME or AE;Decreased strength;Decreased range of motion   OT Treatment/Interventions: Self-care/ADL training;Therapeutic exercise;DME and/or AE instruction;Therapeutic activities;Patient/family education;Balance training    OT Goals(Current goals can be found in the care plan section) Acute Rehab OT Goals Patient Stated Goal: get back to how he was before OT Goal Formulation: With  patient Time For Goal Achievement: 11/13/15 Potential to Achieve Goals: Good ADL Goals Pt Will Perform Lower Body Bathing: with max assist;sit to/from stand;with adaptive equipment Pt Will Perform Upper Body Dressing:  (sitting EOB) Pt Will Perform Lower Body Dressing: with adaptive equipment;with max assist;sit to/from stand Pt Will Transfer to Toilet: with max assist;squat pivot transfer;bedside commode Additional ADL Goal #1: Pt will sit EOB with Min assist for balance as precursor for ADLs. Additional ADL Goal #2: Pt will perform bed mobility with Mod assist.  OT Frequency: Min 2X/week   Barriers to D/C:            Co-evaluation              End of Session    Activity Tolerance: Other (comment) (needed +2 for more activity; pt leaning posteriorly sitting EOB) Patient left: in bed;with call bell/phone within reach  Nurse communication: asked tech to get him his covers   Time: [id number] OT Time Calculation (min): 18 min Charges:  OT General Charges $OT Visit: 1 Procedure OT Evaluation $OT Eval Moderate Complexity: 1 Procedure G-CodesBenito Mccreedy OTR/L [id number] 11/06/2015, 4:40 PM

## 2015-11-06 NOTE — Progress Notes (Signed)
PROGRESS NOTE  [name] [id number] DOB: 07-Mar-1956 DOA: 11/02/2015 PCP: Charlsie Merles, MD  HPI/Recap of past 43 hours: 60 year old male from a nursing facility with history of diabetes mellitus and chronic lower extremity ulcers admitted for low hemoglobin  (reportedly at 5) and sepsis secondary to lower extremity ulcers., Getting courses the patient is a Jehov[employer]'s Witness and refuses transfusions of blood. Patient noted to have hyperkalemia and acute kidney injury.  Monitored closely and hemoglobin has remained stable around 6.   Patient's biggest issue is that despite broad-spectrum antibiotics, he continued to spike fevers. Urine and chest x-ray and exam unrevealing. patient himself complains of chronic lower extremity pain from his ulcers. No new symptoms. CT scan done noted no signs of abscess. Asked infectious disease to evaluate and they are questioning possible vasculitis?  Assessment/Plan: Principal Problem:   Sepsis secondary to Cellulitis of bilateral lower extremities from chronic ulcers: Noted temperature spike continue IV fluids.  : Patient meets criteria with lactic acidosis, leukocytosis and fever plus hypotension with sources bilateral lower extremities. Continue broad-spectrum IV antibiotics plus fluids. Added scheduled OxyContin for pain control.  Potential vasculitis or loss, sick surgery to evaluate for biopsy.  DVT ruled out with lower extremity Dopplers Active Problems:  acute kidney injury: Patient's renal function normal prior to this admission, however last checked 6 months ago. Much of this could be from acute blood loss anemia. Responded well to IV fluids and now resolved    DM (diabetes mellitus), secondary, uncontrolled, with peripheral vascular complications ([place]): CBGs  Trending upward now that he is able to take by mouth. Have started Lantus and sliding scale    Microcytic anemia/Acute blood loss anemia: Workup in progress. As mentioned before, patient  is Jehov[employer]'s Witness. At this time no signs of active bleeding. Hemoglobin has ranged around 7-8 likely from chronic disease and acutely low perhaps from GI bleed. Patient is on daily aspirin plus Mobic. Have stopped these for now , Add PPI     Hyperkalemia: resolved. Treated with Kayexalate   Hypoglycemia   Code Status: Full code  Family Communication: Left message with ex-wife.  Disposition Plan: Overall stable.     Consultants:  Wound care    Infectious disease  Procedures:  None    Antibiotics:  IV vancomycin 2/9-present  IV Zosyn 2/9-present     Objective: BP 163/80 mmHg  Pulse 91  Temp(Src) 99.8 F (37.7 C) (Oral)  Resp 16  Ht 6\' 4"  (1.93 m)  Wt 99.9 kg (220 lb 3.8 oz)  BMI 26.54 kg/m2  SpO2 94%  Intake/Output Summary (Last 24 hours) at 11/06/15 1837 Last data filed at 11/06/15 1500  Gross per 24 hour  Intake   1390 ml  Output   3150 ml  Net  -1760 ml   Filed Weights   11/02/15 2100  Weight: 99.9 kg (220 lb 3.8 oz)    Exam:   General:  Alert and oriented  3, no acute distress  Cardiovascular: Regular rate and rhythm, S1-S2    Respiratory: Clear to auscultation bilaterally  Abdomen: Soft, nontender, nondistended, hypoactive bowel sounds  Musculoskeletal: 2+ pitting edema from the knees down with chronic ulcerations and wounds wrapped from midcalf down, feet in pressure boots  Data Reviewed: Basic Metabolic Panel:  Recent Labs Lab 11/02/15 2015 11/02/15 2307 11/03/15 0528 11/04/15 0216 11/05/15 0400  NA 140  --  141 140 141  K 6.4* 6.5* 5.9* 5.5* 4.7  CL 107  --  114* 111 109  CO2 20*  --  19* 22 21*  GLUCOSE 63*  --  169* 275* 219*  BUN 56*  --  41* 24* 15  CREATININE 2.23*  --  1.47* 0.99 0.95  CALCIUM 8.9  --  8.2* 8.1* 8.2*   Liver Function Tests:  Recent Labs Lab 11/02/15 2015  AST 35  ALT 42  ALKPHOS 179*  BILITOT 0.6  PROT 7.3  ALBUMIN 2.5*   No results for input(s): LIPASE, AMYLASE in the last 168 hours. No  results for input(s): AMMONIA in the last 168 hours. CBC:  Recent Labs Lab 11/02/15 2015 11/03/15 0528 11/03/15 1429 11/04/15 0216 11/05/15 0400  WBC 20.1* 17.7*  --  15.6* 16.1*  NEUTROABS 17.5*  --   --   --   --   HGB 7.1* 5.9* 6.1* 6.2* 6.0*  HCT 23.0* 19.5* 20.5* 20.4* 20.1*  MCV 61.5* 60.4*  --  61.6* 60.0*  PLT 309 268  --  259 268   Cardiac Enzymes:    Recent Labs Lab 11/02/15 2015  TROPONINI 0.04*   BNP (last 3 results) No results for input(s): BNP in the last 8760 hours.  ProBNP (last 3 results) No results for input(s): PROBNP in the last 8760 hours.  CBG:  Recent Labs Lab 11/05/15 1705 11/05/15 2106 11/06/15 0748 11/06/15 1239 11/06/15 1743  GLUCAP 233* 242* 242* 266* 226*    Recent Results (from the past 240 hour(s))  Blood Culture (routine x 2)     Status: None (Preliminary result)   Collection Time: 11/02/15  8:15 PM  Result Value Ref Range Status   Specimen Description BLOOD RIGHT ANTECUBITAL  Final   Special Requests BOTTLES DRAWN AEROBIC AND ANAEROBIC 5CC  Final   Culture NO GROWTH 4 DAYS  Final   Report Status PENDING  Incomplete  Blood Culture (routine x 2)     Status: None (Preliminary result)   Collection Time: 11/02/15  8:35 PM  Result Value Ref Range Status   Specimen Description BLOOD LEFT ANTECUBITAL  Final   Special Requests BOTTLES DRAWN AEROBIC AND ANAEROBIC 3CC  Final   Culture NO GROWTH 4 DAYS  Final   Report Status PENDING  Incomplete  Urine culture     Status: None   Collection Time: 11/02/15 10:58 PM  Result Value Ref Range Status   Specimen Description URINE, CLEAN CATCH  Final   Special Requests NONE  Final   Culture MULTIPLE SPECIES PRESENT, SUGGEST RECOLLECTION  Final   Report Status 11/04/2015 FINAL  Final  C difficile quick scan w PCR reflex     Status: Abnormal   Collection Time: 11/03/15 12:47 AM  Result Value Ref Range Status   C Diff antigen POSITIVE (A) NEGATIVE Final   C Diff toxin NEGATIVE NEGATIVE Final    C Diff interpretation   Final    C. difficile present, but toxin not detected. This indicates colonization. In most cases, this does not require treatment. If patient has signs and symptoms consistent with colitis, consider treatment. Requires ENTERIC precautions.  Gastrointestinal Panel by PCR , Stool     Status: None   Collection Time: 11/03/15 12:47 AM  Result Value Ref Range Status   Campylobacter species NOT DETECTED NOT DETECTED Final   Plesimonas shigelloides NOT DETECTED NOT DETECTED Final   Salmonella species NOT DETECTED NOT DETECTED Final   Yersinia enterocolitica NOT DETECTED NOT DETECTED Final   Vibrio species NOT DETECTED NOT DETECTED Final   Vibrio cholerae NOT DETECTED NOT DETECTED  Final   Enteroaggregative E coli (EAEC) NOT DETECTED NOT DETECTED Final   Enteropathogenic E coli (EPEC) NOT DETECTED NOT DETECTED Final   Enterotoxigenic E coli (ETEC) NOT DETECTED NOT DETECTED Final   Shiga like toxin producing E coli (STEC) NOT DETECTED NOT DETECTED Final   E. coli O157 NOT DETECTED NOT DETECTED Final   Shigella/Enteroinvasive E coli (EIEC) NOT DETECTED NOT DETECTED Final   Cryptosporidium NOT DETECTED NOT DETECTED Final   Cyclospora cayetanensis NOT DETECTED NOT DETECTED Final   Entamoeba histolytica NOT DETECTED NOT DETECTED Final   Giardia lamblia NOT DETECTED NOT DETECTED Final   Adenovirus F40/41 NOT DETECTED NOT DETECTED Final   Astrovirus NOT DETECTED NOT DETECTED Final   Norovirus GI/GII NOT DETECTED NOT DETECTED Final   Rotavirus A NOT DETECTED NOT DETECTED Final   Sapovirus (I, II, IV, and V) NOT DETECTED NOT DETECTED Final  MRSA PCR Screening     Status: None   Collection Time: 11/04/15  9:23 AM  Result Value Ref Range Status   MRSA by PCR NEGATIVE NEGATIVE Final    Comment:        The GeneXpert MRSA Assay (FDA approved for NASAL specimens only), is one component of a comprehensive MRSA colonization surveillance program. It is not intended to diagnose  MRSA infection nor to guide or monitor treatment for MRSA infections.      Studies: Ct Extrem Lower W Cm Bil  11/06/2015  CLINICAL DATA:  Sepsis secondary to cellulitis. Bilateral lower extremity cellulitis and chronic ulcers. Continued fevers despite the use of broad-spectrum antibiotics. EXAM: CT OF THE RIGHT TIBIA/FIBULA WITH CONTRAST CT OF THE LEFT TIBIA/FIBULA WITH CONTRAST CT OF THE RIGHT FOOT WITH CONTRAST, LIMITED CT OF THE LEFT FOOT WITH CONTRAST, LIMITED TECHNIQUE: Multidetector CT imaging of the bilateral lower leg was performed according to the standard protocol following intravenous contrast administration. COMPARISON:  11/02/2015 CONTRAST:  100 cc Omnipaque 300 FINDINGS: CT OF THE RIGHT TIBIA/ FIBULA: Chronically fragmented spurring in the distal quadriceps tendon. today's exam includes the knee as well as the tibia and fibula and foot. Small right knee effusion. Diffuse subcutaneous edema in the calf. Diffuse fatty atrophy of the right peroneus longus muscle. Suspected ulceration along the medial right ankle, superficial to the medial malleolus. No bony destructive findings in the tibia or fibula. No discrete drainable abscess is detected. CT OF THE LEFT TIBIA/ FIBULA: The knee was included. Distal quadriceps spurring. Popliteal artery atherosclerotic calcification. Medial compartmental articular space narrowing compatible with osteoarthritis. Diffuse subcutaneous edema along the calf. Skin and subcutaneous tissue irregularity along the posterior and lateral calf tracking down towards the ankle, suspicious for ulceration and skin wounds. I do not observe a discrete drainable abscess. No bony destructive findings compatible with osteomyelitis. CT OF THE RIGHT FOOT: Please note that multiplanar reconstructions were performed with respect to the tibia and fibula, and not with the foot. This lowers the utility of today's exam as a foot study. There is diffuse subcutaneous edema tracking along the  ankle and the dorsum of the foot. Mild degenerative midfoot findings. No bony destructive findings in the foot to favor osteomyelitis. No drainable abscess in the right foot. Diffuse atrophy of the plantar foot musculature. There is a 5 mm non-fragmented osteochondral lesion along the medial talar dome on image 109 of series 11. CT OF THE LEFT FOOT: Subcutaneous edema both medially and laterally along the ankle. Is subcutaneous edema along the dorsum of the foot and along the medial ball of  the foot. No bony destructive findings are identified. IMPRESSION: 1. Diffuse subcutaneous edema in both calves and tracking along the dorsum of both feet. Skin and subcutaneous irregularities compatible with ulcerations more notable on the posterolateral left calf and along the medial right ankle. No drainable abscess or bony destructive findings characteristic of osteomyelitis. Electronically Signed   By: Van Clines M.D.   On: 11/06/2015 07:18    Scheduled Meds: . sodium chloride   Intravenous Once  . atorvastatin  80 mg Oral Daily  . feeding supplement (ENSURE ENLIVE)  237 mL Oral BID  . feeding supplement (PRO-STAT SUGAR FREE 64)  30 mL Oral TID WC  . ferrous sulfate  325 mg Oral TID WC  . [START ON 11/07/2015] insulin aspart  0-20 Units Subcutaneous TID WC  . insulin aspart  0-5 Units Subcutaneous QHS  . insulin glargine  15 Units Subcutaneous QHS  . mirtazapine  15 mg Oral QHS  . multivitamin with minerals  1 tablet Oral Daily  . oxybutynin  10 mg Oral QHS  . oxyCODONE  10 mg Oral Q12H  . pantoprazole  40 mg Oral BID  . piperacillin-tazobactam (ZOSYN)  IV  3.375 g Intravenous 3 times per day  . pregabalin  75 mg Oral BID  . sodium chloride flush  3 mL Intravenous Q12H  . tamsulosin  0.4 mg Oral QPC supper  . vancomycin  1,000 mg Intravenous Q8H    Continuous Infusions: . sodium chloride 75 mL/hr at 11/06/15 1012     Time spent: 25 minutes   [facility] Pager  [phone] . If 7PM-7AM, please contact night-coverage at [email or web address], password [facility] 11/06/2015, 6:37 PM  LOS: 4 days

## 2015-11-06 NOTE — Consult Note (Addendum)
[facility] for Infectious Disease  Total days of antibiotics 5        Day 5 vanco        Day 5 piptazo               Reason for Consult: FUO   Referring Physician: [name]  Principal Problem:   Cellulitis of leg Active Problems:   Sepsis ([place])   DM (diabetes mellitus), secondary, uncontrolled, with peripheral vascular complications (HCC)   Microcytic anemia   Leg ulcer (HCC)   AKI (acute kidney injury) ([place])   Hyperkalemia   Hypoglycemia    HPI: [name] is a 60 y.o. male with hx of DM2, HLD, HTN, chronic leg ulcers within the last 8 months, SNF resident  presents to ED on 2/9 with fever, and concern for leg wound infection. He was found to have leukocytosis of 17.7K, sed rate of 119  and low hemoglobin 5.9 possibly related to subacute gi bleed. He denied hematuria or bloody stool. No chest pain or shortness of breath. Patient has a chronic bilateral non-healing leg wounds for the past 8 months initially starting on left ankle. He was last hospitalized at our facility in August and treated with IV abtx then converted to oral abtx for a total course of 2 wks, and seen by the ID consult team at that time. He  has been to wound care center for at least the last 3 months but does not feel that it is improving.It appears that the patient may have been referred to [facility] for intra-operative debridement and biopsy.  ED note suggests purulent discharge from the wounds. His legs are swelling and tender. Patient has fever, no chills. No nausea or vomiting. He reports that he previously was worked up for intermittent fevers and had not found any source other than his legs. Due to concern for sepsis, he was started on vanco and piptazo but still has intermittent fever of 101 on a daily basis. Patient on admit was seen by wound care team, who provided further information regarding wounds. The patient is thought to have vasculitis like ulcers.  His wound descriptions include:  Right medial LE  ulcer (full thickness crater): 10cm x 7cm x 0.4cm Left medial LE ulcer (partial thickness): 5.5cm x 8cm x 0.2cm Left posterior LE (full thickness): 8cm x 16cm x 0.4cm Left lateral LE (full thickness crater): 10cm x 4cm x 1cm Left lateral to anterior LE (full thickness craters, three (3) distinct ulcers): Proximal: 3cm x 2cm x 1cm, Mid: 6cm x 4.5cm x 1cm, Distal: 3.5cm x 3cm x 1cm Wound bed:All present withy same clinical illustration: ruddy red with yellow slough, non-granulating wound beds Drainage (amount, consistency, odor) moderate amount of light yellow exudate on old dressings Periwound:intact, dry. Right LE with evidence of either previous wound healing or new areas of tissue vulnerability beginning to present.   Past Medical History  Diagnosis Date  . Hypertension   . Chronic hip pain   . GERD (gastroesophageal reflux disease)   . Depression   . Hypercholesterolemia   . Type II diabetes mellitus ([place])   . Pneumonia 1960's X 1  . Anemia   . Sleep apnea     "couldn't wear the mask" (04/26/2015)  . Thalassemia minor   . Arthritis     "left hip" (04/26/2015)  . Cellulitis and abscess of leg hositalized 04/25/2015    left    Allergies:  Allergies  Allergen Reactions  . Other  Pt is a Jehovah Witness. No blood products.  . Sulfa Antibiotics     Causes flu-like symptom : sweating, chills, fever, body aches    MEDICATIONS: . sodium chloride   Intravenous Once  . atorvastatin  80 mg Oral Daily  . feeding supplement (ENSURE ENLIVE)  237 mL Oral BID  . feeding supplement (PRO-STAT SUGAR FREE 64)  30 mL Oral TID WC  . ferrous sulfate  325 mg Oral TID WC  . [START ON 11/07/2015] insulin aspart  0-20 Units Subcutaneous TID WC  . insulin aspart  0-5 Units Subcutaneous QHS  . insulin glargine  15 Units Subcutaneous QHS  . mirtazapine  15 mg Oral QHS  . multivitamin with minerals  1 tablet Oral Daily  . oxybutynin  10 mg Oral QHS  . oxyCODONE  10 mg Oral Q12H  .  pantoprazole  40 mg Oral BID  . piperacillin-tazobactam (ZOSYN)  IV  3.375 g Intravenous 3 times per day  . pregabalin  75 mg Oral BID  . sodium chloride flush  3 mL Intravenous Q12H  . tamsulosin  0.4 mg Oral QPC supper  . vancomycin  1,000 mg Intravenous Q8H    Social History  Substance Use Topics  . Smoking status: Current Every Day Smoker -- 0.12 packs/day for 45 years    Types: Cigarettes  . Smokeless tobacco: Never Used  . Alcohol Use: Yes     Comment: 04/26/2015 "I may drink 1/2 glass of wine or a couple beers 1-2 times/month; if that"    Family History  Problem Relation Age of Onset  . Diabetes Mellitus II Mother   . Diabetes Mellitus II Father   . Diabetes Mellitus II Brother     Review of Systems -  Constitutional: Positive for fever, chills, diaphoresis,  Negative for activity change, appetite change, fatigue and unexpected weight change.  HENT: Negative for congestion, sore throat, rhinorrhea, sneezing, trouble swallowing and sinus pressure.  Eyes: Negative for photophobia and visual disturbance.  Respiratory: Negative for cough, chest tightness, shortness of breath, wheezing and stridor.  Cardiovascular: Negative for chest pain, palpitations and leg swelling.  Gastrointestinal: Negative for nausea, vomiting, abdominal pain, diarrhea, constipation, blood in stool, abdominal distention and anal bleeding.  Genitourinary: Negative for dysuria, hematuria, flank pain and difficulty urinating.  Musculoskeletal: Negative for myalgias, back pain, joint swelling, arthralgias and gait problem.  Skin: positive rash. Negative for color change, pallor, rash and wound.  Neurological: Negative for dizziness, tremors, weakness and light-headedness.  Hematological: Negative for adenopathy. Does not bruise/bleed easily.  Psychiatric/Behavioral: Negative for behavioral problems, confusion, sleep disturbance, dysphoric mood, decreased concentration and agitation.       OBJECTIVE: Temp:  [99.3 F (37.4 C)-101.1 F (38.4 C)] 99.8 F (37.7 C) (02/13 1337) Pulse Rate:  [87-93] 91 (02/13 1337) Resp:  [16-18] 16 (02/13 1337) BP: (129-163)/(62-80) 163/80 mmHg (02/13 1337) SpO2:  [86 %-94 %] 94 % (02/13 1337) Physical Exam  Constitutional:  oriented to person, place, and time. appears well-developed and well-nourished. No distress.  HENT: [place]/AT, PERRLA, no scleral icterus Mouth/Throat: Oropharynx is clear and moist. No oropharyngeal exudate.  Cardiovascular: Normal rate, regular rhythm and normal heart sounds. Exam reveals no gallop and no friction rub.  No murmur [place].  Pulmonary/Chest: Effort normal and breath sounds normal. No respiratory distress.  has no wheezes.  Neck = supple, no nuchal rigidity Abdominal: Soft. Bowel sounds are normal.  exhibits no distension. There is no tenderness.  Lymphadenopathy: no cervical adenopathy. No axillary  adenopathy Neurological: alert and oriented to person, place, and time.  Skin: legs wrapped, ulcers, numerous to left > right lower extremity. Left leg nearly circumferencial Ext: pitting edema +1 Psychiatric: a normal mood and affect.  behavior is normal.   LABS: Results for orders placed or performed during the hospital encounter of 11/02/15 (from the past 48 hour(s))  Glucose, capillary     Status: Abnormal   Collection Time: 11/04/15  6:39 PM  Result Value Ref Range   Glucose-Capillary 189 (H) 65 - 99 mg/dL  Glucose, capillary     Status: Abnormal   Collection Time: 11/04/15  8:57 PM  Result Value Ref Range   Glucose-Capillary 214 (H) 65 - 99 mg/dL  CBC     Status: Abnormal   Collection Time: 11/05/15  4:00 AM  Result Value Ref Range   WBC 16.1 (H) 4.0 - 10.5 K/uL   RBC 3.35 (L) 4.22 - 5.81 MIL/uL   Hemoglobin 6.0 (LL) 13.0 - 17.0 g/dL    Comment: CRITICAL VALUE NOTED.  VALUE IS CONSISTENT WITH PREVIOUSLY REPORTED AND CALLED VALUE. REPEATED TO VERIFY    HCT 20.1 (L) 39.0 - 52.0 %   MCV 60.0  (L) 78.0 - 100.0 fL   MCH 17.9 (L) 26.0 - 34.0 pg   MCHC 29.9 (L) 30.0 - 36.0 g/dL   RDW 19.0 (H) 11.5 - 15.5 %   Platelets 268 150 - 400 K/uL  Basic metabolic panel     Status: Abnormal   Collection Time: 11/05/15  4:00 AM  Result Value Ref Range   Sodium 141 135 - 145 mmol/L   Potassium 4.7 3.5 - 5.1 mmol/L   Chloride 109 101 - 111 mmol/L   CO2 21 (L) 22 - 32 mmol/L   Glucose, Bld 219 (H) 65 - 99 mg/dL   BUN 15 6 - 20 mg/dL   Creatinine, Ser 0.95 0.61 - 1.24 mg/dL   Calcium 8.2 (L) 8.9 - 10.3 mg/dL   GFR calc non Af Amer >60 >60 mL/min   GFR calc Af Amer >60 >60 mL/min    Comment: (NOTE) The eGFR has been calculated using the CKD EPI equation. This calculation has not been validated in all clinical situations. eGFR's persistently <60 mL/min signify possible Chronic Kidney Disease.    Anion gap 11 5 - 15  Glucose, capillary     Status: Abnormal   Collection Time: 11/05/15  7:51 AM  Result Value Ref Range   Glucose-Capillary 184 (H) 65 - 99 mg/dL  Glucose, capillary     Status: Abnormal   Collection Time: 11/05/15 12:16 PM  Result Value Ref Range   Glucose-Capillary 209 (H) 65 - 99 mg/dL  Lactic acid, plasma     Status: None   Collection Time: 11/05/15  2:01 PM  Result Value Ref Range   Lactic Acid, Venous 0.8 0.5 - 2.0 mmol/L  Lactic acid, plasma     Status: None   Collection Time: 11/05/15  4:08 PM  Result Value Ref Range   Lactic Acid, Venous 0.8 0.5 - 2.0 mmol/L  Glucose, capillary     Status: Abnormal   Collection Time: 11/05/15  5:05 PM  Result Value Ref Range   Glucose-Capillary 233 (H) 65 - 99 mg/dL  Vancomycin, trough     Status: Abnormal   Collection Time: 11/05/15  7:03 PM  Result Value Ref Range   Vancomycin Tr 5 (L) 10.0 - 20.0 ug/mL  Glucose, capillary     Status: Abnormal  Collection Time: 11/05/15  9:06 PM  Result Value Ref Range   Glucose-Capillary 242 (H) 65 - 99 mg/dL  Glucose, capillary     Status: Abnormal   Collection Time: 11/06/15  7:48  AM  Result Value Ref Range   Glucose-Capillary 242 (H) 65 - 99 mg/dL   Comment 1 Notify RN   Glucose, capillary     Status: Abnormal   Collection Time: 11/06/15 12:39 PM  Result Value Ref Range   Glucose-Capillary 266 (H) 65 - 99 mg/dL   Comment 1 Notify RN     MICRO: 2/9 blood cx ngtd, 2/9 urine cx ngtd IMAGING: Ct Extrem Lower W Cm Bil  11/06/2015  CLINICAL DATA:  Sepsis secondary to cellulitis. Bilateral lower extremity cellulitis and chronic ulcers. Continued fevers despite the use of broad-spectrum antibiotics. EXAM: CT OF THE RIGHT TIBIA/FIBULA WITH CONTRAST CT OF THE LEFT TIBIA/FIBULA WITH CONTRAST CT OF THE RIGHT FOOT WITH CONTRAST, LIMITED CT OF THE LEFT FOOT WITH CONTRAST, LIMITED TECHNIQUE: Multidetector CT imaging of the bilateral lower leg was performed according to the standard protocol following intravenous contrast administration. COMPARISON:  11/02/2015 CONTRAST:  100 cc Omnipaque 300 FINDINGS: CT OF THE RIGHT TIBIA/ FIBULA: Chronically fragmented spurring in the distal quadriceps tendon. today's exam includes the knee as well as the tibia and fibula and foot. Small right knee effusion. Diffuse subcutaneous edema in the calf. Diffuse fatty atrophy of the right peroneus longus muscle. Suspected ulceration along the medial right ankle, superficial to the medial malleolus. No bony destructive findings in the tibia or fibula. No discrete drainable abscess is detected. CT OF THE LEFT TIBIA/ FIBULA: The knee was included. Distal quadriceps spurring. Popliteal artery atherosclerotic calcification. Medial compartmental articular space narrowing compatible with osteoarthritis. Diffuse subcutaneous edema along the calf. Skin and subcutaneous tissue irregularity along the posterior and lateral calf tracking down towards the ankle, suspicious for ulceration and skin wounds. I do not observe a discrete drainable abscess. No bony destructive findings compatible with osteomyelitis. CT OF THE RIGHT  FOOT: Please note that multiplanar reconstructions were performed with respect to the tibia and fibula, and not with the foot. This lowers the utility of today's exam as a foot study. There is diffuse subcutaneous edema tracking along the ankle and the dorsum of the foot. Mild degenerative midfoot findings. No bony destructive findings in the foot to favor osteomyelitis. No drainable abscess in the right foot. Diffuse atrophy of the plantar foot musculature. There is a 5 mm non-fragmented osteochondral lesion along the medial talar dome on image 109 of series 11. CT OF THE LEFT FOOT: Subcutaneous edema both medially and laterally along the ankle. Is subcutaneous edema along the dorsum of the foot and along the medial ball of the foot. No bony destructive findings are identified. IMPRESSION: 1. Diffuse subcutaneous edema in both calves and tracking along the dorsum of both feet. Skin and subcutaneous irregularities compatible with ulcerations more notable on the posterolateral left calf and along the medial right ankle. No drainable abscess or bony destructive findings characteristic of osteomyelitis. Electronically Signed   By: Van Clines M.D.   On: 11/06/2015 07:18    Assessment/Plan:  60yo M with chronic leg ulcers with leukocytosis, intermittent fevers concerning for infection vs. questionable vasculitic wounds that would not necessarily respond to abtx.  - If patient has not had biopsy done by plastics. I would recommend to ask Dr. Marla Roe to evaluate patient, get biopsy and see if c/w vasculitis, then would treat with steroids.  -  recommend to get MRI of lower extremities to rule out deep tissue abscess  - will order vasculitis labs  - for now continue with vancomycin and piptazo  45 min spent with greater than 50% with patient, reviewing prior records and coordination of care.  Elzie Rings [facility] for Infectious Diseases [phone]

## 2015-11-06 NOTE — NC FL2 (Signed)
[place] MEDICAID FL2 LEVEL OF CARE SCREENING TOOL     IDENTIFICATION  Patient Name: [name] Birthdate: 06-21-1956 Sex: male Admission Date (Current Location): 11/02/2015  [facility] and [place] Number:  Herbalist and Address:  [facility] [place] [address], [place], [place] [address]      Provider Number: [id number]  Attending Physician Name and Address:  Annita Brod, MD  Relative Name and Phone Number:       Current Level of Care: Hospital Recommended Level of Care: [place] Prior Approval Number:    Date Approved/Denied:   PASRR Number:    Discharge Plan: SNF    Current Diagnoses: Patient Active Problem List   Diagnosis Date Noted  . AKI (acute kidney injury) ([place]) 11/02/2015  . Hyperkalemia 11/02/2015  . Hypoglycemia 11/02/2015  . Cellulitis   . Leg ulcer ([place])   . Nonhealing ulcer of left lower extremity ([place])   . Pain of left leg   . Cellulitis of leg 04/25/2015  . Degenerative arthritis of hip 04/25/2015  . Cellulitis and abscess of leg   . Microcytic anemia 03/27/2015  . Hypertension   . Edema   . Fever   . DM (diabetes mellitus), secondary, uncontrolled, with peripheral vascular complications ([place]) 0000000  . MSSA (methicillin susceptible Staphylococcus aureus) infection 03/20/2015  . Leukocytosis 03/20/2015  . Anemia, iron deficiency 03/20/2015  . Cellulitis of left lower extremity   . Pyomyositis   . Sepsis ([place]) 03/14/2015    Orientation RESPIRATION BLADDER Height & Weight        Normal Indwelling catheter Weight: 220 lb 3.8 oz (99.9 kg) Height:  6\' 4"  (193 cm)  BEHAVIORAL SYMPTOMS/MOOD NEUROLOGICAL BOWEL NUTRITION STATUS      Incontinent (rectal tube) Diet (carb modified)  AMBULATORY STATUS COMMUNICATION OF NEEDS Skin   Extensive Assist Verbally Other (Comment) (diabetic ulcer- leg)                       Personal Care Assistance Level of Assistance  Bathing, Dressing,  Feeding Bathing Assistance: Maximum assistance Feeding assistance: Limited assistance Dressing Assistance: Maximum assistance     Functional Limitations Info             SPECIAL CARE FACTORS FREQUENCY  PT (By licensed PT), OT (By licensed OT)     PT Frequency: 5/wk OT Frequency: 5/wk            Contractures      Additional Factors Info  Code Status, Allergies, Insulin Sliding Scale, Isolation Precautions Code Status Info: FULL Allergies Info: Other, Sulfa Antibiotics   Insulin Sliding Scale Info: 4/day Isolation Precautions Info: Enteric     Current Medications (11/06/2015):  This is the current hospital active medication list Current Facility-Administered Medications  Medication Dose Route Frequency Provider Last Rate Last Dose  . 0.9 %  sodium chloride infusion   Intravenous Once Ivor Costa, MD      . 0.9 %  sodium chloride infusion   Intravenous Continuous Annita Brod, MD 75 mL/hr at 11/05/15 2156    . acetaminophen (TYLENOL) tablet 650 mg  650 mg Oral Q6H PRN Ivor Costa, MD   650 mg at 11/06/15 0659  . atorvastatin (LIPITOR) tablet 80 mg  80 mg Oral Daily Ivor Costa, MD   80 mg at 11/05/15 0859  . dextrose 50 % solution 50 mL  50 mL Intravenous PRN Ivor Costa, MD   50 mL at 11/03/15  [id number]  . docusate sodium (COLACE) capsule 100 mg  100 mg Oral BID PRN Ivor Costa, MD      . feeding supplement (ENSURE ENLIVE) (ENSURE ENLIVE) liquid 237 mL  237 mL Oral BID Ivor Costa, MD   237 mL at 11/05/15 2200  . feeding supplement (PRO-STAT SUGAR FREE 64) liquid 30 mL  30 mL Oral TID WC Ivor Costa, MD   30 mL at 11/05/15 1738  . ferrous sulfate tablet 325 mg  325 mg Oral TID WC Ivor Costa, MD   325 mg at 11/05/15 1738  . hydrALAZINE (APRESOLINE) injection 5 mg  5 mg Intravenous Q2H PRN Ivor Costa, MD      . insulin aspart (novoLOG) injection 0-5 Units  0-5 Units Subcutaneous QHS Annita Brod, MD   2 Units at 11/05/15 2259  . insulin aspart (novoLOG) injection 0-9 Units  0-9 Units  Subcutaneous TID WC Annita Brod, MD   3 Units at 11/05/15 1739  . mirtazapine (REMERON) tablet 15 mg  15 mg Oral QHS Ivor Costa, MD   15 mg at 11/05/15 2153  . multivitamin with minerals tablet 1 tablet  1 tablet Oral Daily Ivor Costa, MD   1 tablet at 11/05/15 0859  . ondansetron (ZOFRAN) tablet 4 mg  4 mg Oral Q6H PRN Ivor Costa, MD      . oxybutynin (DITROPAN-XL) 24 hr tablet 10 mg  10 mg Oral QHS Ivor Costa, MD   10 mg at 11/05/15 2205  . oxyCODONE (Oxy IR/ROXICODONE) immediate release tablet 5 mg  5 mg Oral Q4H PRN Annita Brod, MD   5 mg at 11/06/15 0659  . oxyCODONE (OXYCONTIN) 12 hr tablet 10 mg  10 mg Oral Q12H Annita Brod, MD   10 mg at 11/05/15 2153  . pantoprazole (PROTONIX) EC tablet 40 mg  40 mg Oral BID Annita Brod, MD   40 mg at 11/05/15 2154  . piperacillin-tazobactam (ZOSYN) IVPB 3.375 g  3.375 g Intravenous 3 times per day Franky Macho, RPH   3.375 g at 11/06/15 0535  . polyethylene glycol (MIRALAX / GLYCOLAX) packet 17 g  17 g Oral Daily PRN Ivor Costa, MD      . pregabalin (LYRICA) capsule 75 mg  75 mg Oral BID Ivor Costa, MD   75 mg at 11/05/15 2153  . sodium chloride flush (NS) 0.9 % injection 3 mL  3 mL Intravenous Q12H Ivor Costa, MD   3 mL at 11/03/15 2130  . tamsulosin (FLOMAX) capsule 0.4 mg  0.4 mg Oral QPC supper Ivor Costa, MD   0.4 mg at 11/05/15 1738  . vancomycin (VANCOCIN) IVPB 1000 mg/200 mL premix  1,000 mg Intravenous Q8H Crystal Trellis Moment, RPH   1,000 mg at 11/06/15 0430     Discharge Medications: Please see discharge summary for a list of discharge medications.  Relevant Imaging Results:  Relevant Lab Results:   Additional Information SS#: [id number]  [place], [place]

## 2015-11-06 NOTE — Consult Note (Signed)
WOC wound consult note Reason for Consult: Bilateral LE ulcers.  Third request since 11/03/15 when patient was seen by this department and this Probation officer. There has been no chance in patient's condition since the initial consultation 72 hours ago. Please see note from that date, particularly the complexity of these ulcerations and the decision from the MD and interdisciplinary treatment team at the outpatient wound care center to [facility] a [facility].  Suggest consult with ortho, vascular or Plastics, as these wounds exceed the scop and expertise of the [facility] nurse.  [place] nursing team will not follow, but will remain available to this patient, the nursing and medical teams.  Please re-consult if needed for other integumentary issues. Thanks, Maudie Flakes, MSN, RN, [place], Arther Abbott  Pager# [phone]

## 2015-11-07 LAB — BASIC METABOLIC PANEL
ANION GAP: 14 (ref 5–15)
BUN: 12 mg/dL (ref 6–20)
CHLORIDE: 104 mmol/L (ref 101–111)
CO2: 19 mmol/L — AB (ref 22–32)
Calcium: 8.4 mg/dL — ABNORMAL LOW (ref 8.9–10.3)
Creatinine, Ser: 0.95 mg/dL (ref 0.61–1.24)
GFR calc non Af Amer: >60 mL/min (ref >60)
Glucose, Bld: 225 mg/dL — ABNORMAL HIGH (ref 65–99)
POTASSIUM: 4.6 mmol/L (ref 3.5–5.1)
Sodium: 137 mmol/L (ref 135–145)

## 2015-11-07 LAB — CULTURE, BLOOD (ROUTINE X 2)
Culture: NO GROWTH
Culture: NO GROWTH

## 2015-11-07 LAB — CBC
HEMATOCRIT: 19.5 % — AB (ref 39.0–52.0)
HEMOGLOBIN: 6.1 g/dL — AB (ref 13.0–17.0)
MCH: 18.6 pg — ABNORMAL LOW (ref 26.0–34.0)
MCHC: 31.3 g/dL (ref 30.0–36.0)
MCV: 59.5 fL — AB (ref 78.0–100.0)
Platelets: 318 10*3/uL (ref 150–400)
RBC: 3.28 MIL/uL — ABNORMAL LOW (ref 4.22–5.81)
RDW: 18.8 % — ABNORMAL HIGH (ref 11.5–15.5)
WBC: 15.4 10*3/uL — AB (ref 4.0–10.5)

## 2015-11-07 LAB — GLUCOSE, CAPILLARY
GLUCOSE-CAPILLARY: 249 mg/dL — AB (ref 65–99)
GLUCOSE-CAPILLARY: 306 mg/dL — AB (ref 65–99)
Glucose-Capillary: 219 mg/dL — ABNORMAL HIGH (ref 65–99)
Glucose-Capillary: 260 mg/dL — ABNORMAL HIGH (ref 65–99)
Glucose-Capillary: 195 mg/dL — ABNORMAL HIGH (ref 65–99)

## 2015-11-07 LAB — CK: Total CK: 80 U/L (ref 49–397)

## 2015-11-07 LAB — C-REACTIVE PROTEIN: CRP: 29.9 mg/dL — ABNORMAL HIGH (ref <1.0)

## 2015-11-07 MED ORDER — MORPHINE SULFATE (PF) 2 MG/ML IV SOLN
1.0000 mg | INTRAVENOUS | Status: DC | PRN
  Administered 2015-11-07 – 2015-11-13 (×27): 1 mg via INTRAVENOUS
  Filled 2015-11-07 (×28): qty 1

## 2015-11-07 MED ORDER — IOHEXOL 300 MG/ML  SOLN
100.0000 mL | Freq: Once | INTRAMUSCULAR | Status: AC | PRN
  Administered 2015-11-05: 100 mL via INTRAVENOUS

## 2015-11-07 MED ORDER — LIDOCAINE HCL (PF) 2 % IJ SOLN
0.0000 mL | Freq: Once | INTRAMUSCULAR | Status: DC | PRN
  Filled 2015-11-07: qty 20

## 2015-11-07 MED ORDER — OXYCODONE HCL ER 10 MG PO T12A
20.0000 mg | EXTENDED_RELEASE_TABLET | Freq: Two times a day (BID) | ORAL | Status: DC
  Administered 2015-11-07 – 2015-11-13 (×12): 20 mg via ORAL
  Filled 2015-11-07 (×13): qty 2

## 2015-11-07 NOTE — Clinical Social Work Note (Signed)
Clinical Social Work Assessment  Patient Details  Name: [name] MRN: [id number] Date of Birth: December 13, 1955  Date of referral:  11/07/15               Reason for consult:  Facility Placement                Permission sought to share information with:  Facility Art therapist granted to share information::  Yes, Verbal Permission Granted  Name::        Agency::  [facility]  Relationship::     Contact Information:     Housing/Transportation Living arrangements for the past 2 months:  [place] of Information:  Patient Patient Interpreter Needed:  None Criminal Activity/Legal Involvement Pertinent to Current Situation/Hospitalization:  No - Comment as needed Significant Relationships:  Other(Comment) (ex-wife) Lives with:  Facility Resident Do you feel safe going back to the place where you live?  Yes Need for family participation in patient care:  No (Coment)  Care giving concerns:  None- pt is LTC resident at [facility] assessment / plan:  CSW spoke with pt concerning recommendation for return to SNF.  Pt states that he has been in multiple SNFs over the past few months while dealing with his current medical issues- states he has been in [facility] for 6 months and has transitioned to LTC  Employment status:  Disabled (Comment on whether or not currently receiving Disability) Insurance information:  Medicaid [place] PT Recommendations:  [place] / Referral to community resources:  [place] (chaplain services)  Patient/Family's Response to care: Pt is agreeable to returning to SNF  Patient/Family's Understanding of and Emotional Response to Diagnosis, Current Treatment, and Prognosis:  Pt is anxious about the future and his current health- states that he doesn't always know if he is gonna wake up in the morning and feels overwhelmed by his medical condition at his  relatively young age.  Pt is hopeful that the various medical interventions the doctors have discussed with him will work.  CSW made chaplain consult with pt permission to further discuss his concerns.  Emotional Assessment Appearance:  Appears stated age Attitude/Demeanor/Rapport:    Affect (typically observed):  Appropriate, Sad Orientation:  Oriented to Self, Oriented to Place, Oriented to  Time, Oriented to Situation Alcohol / Substance use:  Not Applicable Psych involvement (Current and /or in the community):  No (Comment)  Discharge Needs  Concerns to be addressed:  Care Coordination Readmission within the last 30 days:  No Current discharge risk:  None Barriers to Discharge:  Continued Medical Work up   [employer], LCSW 11/07/2015, 1:46 PM

## 2015-11-07 NOTE — Care Management Note (Signed)
Case Management Note  Patient Details  Name: [name] MRN: [id number] Date of Birth: 27-Aug-1956  Subjective/Objective:                 Patient admitted with cellulitis of leg, continues to have fevers overnight IV Abx in place. LTC resident at [facility].   Action/Plan:  Anticipate DC to [facility] when medically stable.  Expected Discharge Date:  11/08/15               Expected Discharge Plan:  [place] (From [place])  In-House Referral:  Clinical Social Work  Discharge planning Services  CM Consult  Post Acute Care Choice:    Choice offered to:     DME Arranged:    DME Agency:     HH Arranged:    [place] Agency:     Status of Service:  Completed, signed off  Medicare Important Message Given:    Date Medicare IM Given:    Medicare IM give by:    Date Additional Medicare IM Given:    Additional Medicare Important Message give by:     If discussed at [place] of Stay Meetings, dates discussed:    Additional Comments:  Carles Collet, RN 11/07/2015, 1:50 PM

## 2015-11-07 NOTE — Progress Notes (Signed)
Inpatient Diabetes Program Recommendations  AACE/ADA: New Consensus Statement on Inpatient Glycemic Control (2015)  Target Ranges:  Prepandial:   less than 140 mg/dL      Peak postprandial:   less than 180 mg/dL (1-2 hours)      Critically ill patients:  140 - 180 mg/dL   Results for Groninger, EKANSH (MRN [id number]) as of 11/07/2015 09:03  Ref. Range 11/06/2015 07:48 11/06/2015 12:39 11/06/2015 17:43 11/06/2015 23:16 11/07/2015 08:21  Glucose-Capillary Latest Ref Range: 65-99 mg/dL 242 (H) 266 (H) 226 (H) 219 (H) 249 (H)   Review of Glycemic Control  Diabetes history: DM 2 Outpatient Diabetes medications: 70/30 25 units BID Current orders for Inpatient glycemic control: Lantus 15 units, Novolog Resistant + HS scale  Inpatient Diabetes Program Recommendations: Insulin - Basal: Fasting glucose in the 240's the past 2 mornings. Patient takes 70/30 25 units BID at home (equivalent of 35 units of basal). Please consider increasing basal insulin to Lantus 22 units.  Thanks,  Tama Headings RN, MSN, [facility] Inpatient Diabetes Coordinator Team Pager [phone] (8a-5p)

## 2015-11-07 NOTE — Progress Notes (Signed)
Physical Therapy Treatment Patient Details Name: [name] MRN: [id number] DOB: Oct 03, 1955 Today's Date: 11/07/2015    History of Present Illness Patient is a 61 y.o. male with hx of DM, hyperlipidemia, iron deficiency anemia, chronic LE ulcers, degenerative hip, depression, HTN presented with low hemoglobin and sepsis secondary to lower extremity ulcers. Pt is a Jehovah's Witness so refusing blood transfusion. Is also noted to have hyperkalemia and acute kidney injury.     PT Comments    Patient continues to be very limited by pain and fatigue. Was very willing to attempt working with PT this session and wants to be able to return to walking again. Continue with current POC. Continue to recommend SNF for ongoing Physical Therapy.     Follow Up Recommendations  SNF     Equipment Recommendations  None recommended by PT    Recommendations for Other Services       Precautions / Restrictions Precautions Precautions: Fall Precaution Comments: gauze wrap on BLE ulcers. Patient has flexiseal    Mobility  Bed Mobility Overal bed mobility: Needs Assistance Bed Mobility: Sit to Supine;Supine to Sit     Supine to sit: +2 for physical assistance;Max assist Sit to supine: +2 for physical assistance;Max assist   General bed mobility comments: A with use of chuck pad to position LEs and trunk out of bed and back into bed. Patient unable to sit fully upright with heavy posterior lean on PT tech. Patient able to assist move LE very little and required A to position fully back into bed. Patient was able to use support of UE on bed frame and railings to positon himself.   Transfers                 General transfer comment: not assessed  Ambulation/Gait                 Stairs            Wheelchair Mobility    Modified Rankin (Stroke Patients Only)       Balance Overall balance assessment: Needs assistance Sitting-balance support: Feet supported Sitting  balance-Leahy Scale: Zero Sitting balance - Comments: Requires Mod-Max A for half sitting EOB; not able to flex left hip so leanign heavily posteriorly. Sat EOB ~4 mins before needed to lie back down                            Cognition Arousal/Alertness: Awake/alert Behavior During Therapy: WFL for tasks assessed/performed Overall Cognitive Status: Within Functional Limits for tasks assessed                      Exercises      General Comments        Pertinent Vitals/Pain Pain Score: 10-Worst pain ever Pain Location: Bilateral LEs Pain Descriptors / Indicators: Sore;Constant Pain Intervention(s): Monitored during session;Repositioned    Home Living                      Prior Function            PT Goals (current goals can now be found in the care plan section) Progress towards PT goals: Not progressing toward goals - comment    Frequency  Min 2X/week    PT Plan Current plan remains appropriate    Co-evaluation             End of Session  Activity Tolerance: Patient limited by fatigue;Patient limited by pain Patient left: in bed;with bed alarm set;with call bell/phone within reach     Time: 1055-1111 PT Time Calculation (min) (ACUTE ONLY): 16 min  Charges:  $Therapeutic Activity: 8-22 mins                    G Codes:      Jacqualyn Posey 11/07/2015, 11:28 AM 11/07/2015 Robinette, Tonia Brooms PTA

## 2015-11-07 NOTE — Progress Notes (Signed)
PROGRESS NOTE  [name] [id number] DOB: 19-Sep-1956 DOA: 11/02/2015 PCP: Charlsie Merles, MD  HPI/Recap of past 85 hours: 60 year old male from a nursing facility with history of diabetes mellitus and chronic lower extremity ulcers admitted for low hemoglobin  (reportedly at 5) and sepsis secondary to lower extremity ulcers., Getting courses the patient is a Jehovah's Witness and refuses transfusions of blood. Patient noted to have hyperkalemia and acute kidney injury.  Monitored closely and hemoglobin has remained stable around 6.   Patient's biggest issue is that despite broad-spectrum antibiotics, he continued to spike fevers. Urine and chest x-ray and exam unrevealing. patient himself complains of chronic lower extremity pain from his ulcers. No new symptoms. Dopplers on admission ruled out DVT. CT scan done noted no signs of abscess. Infectious disease consulted who felt that persistent fever and leukocytosis might be questionable vasculitis? Plastic surgery consulted for biopsy who will see today.  Patient himself frustrated. Still having lots of pain, despite attempts to work physical therapy.   Assessment/Plan: Principal Problem:   Sepsis secondary to Cellulitis of bilateral lower extremities from chronic ulcers: Noted temperature spike continue IV fluids.  : Patient meets criteria with lactic acidosis, leukocytosis and fever plus hypotension with sources bilateral lower extremities. Continue broad-spectrum IV antibiotics plus fluids. initially admitted and now will increase scheduled extended release  OxyContin for pain control.  Potential vasculitis or l.plastic surgery to evaluate for potential biopsy.     acute kidney injury: Patient's renal function normal prior to this admission, however last checked 6 months ago. Much of this could be from acute blood loss anemia. Responded well to IV fluids and now resolved    DM (diabetes mellitus), secondary, uncontrolled, with peripheral  vascular complications ([place]): CBGs  Trending upward now that he is able to take by mouth. Have started Lantus and sliding scale    Microcytic anemia/Acute blood loss anemia: Workup in progress. As mentioned before, patient is Jehovah's Witness. At this time no signs of active bleeding. Hemoglobin has ranged around 7-8 likely from chronic disease and acutely low perhaps from GI bleed. Patient is on daily aspirin plus Mobic. Have stopped these for now , Add PPI     Hyperkalemia: resolved. Treated with Kayexalate.  Stable. Remove rectal tube    Hypoglycemia   Code Status: Full code  Family Communication: Left message with ex-wife.  Disposition Plan: awaiting plastic surgery evaluation.     Consultants:  Wound care    Infectious disease   plastic surgery  Procedures:  None    Antibiotics:  IV vancomycin 2/9-present  IV Zosyn 2/9-present     Objective: BP 134/67 mmHg  Pulse 71  Temp(Src) 98.4 F (36.9 C) (Oral)  Resp 20  Ht 6\' 4"  (1.93 m)  Wt 107.6 kg (237 lb 3.4 oz)  BMI 28.89 kg/m2  SpO2 100%  Intake/Output Summary (Last 24 hours) at 11/07/15 1444 Last data filed at 11/07/15 1219  Gross per 24 hour  Intake 1828.75 ml  Output   2860 ml  Net -1031.25 ml   Filed Weights   11/02/15 2100 11/07/15 0506  Weight: 99.9 kg (220 lb 3.8 oz) 107.6 kg (237 lb 3.4 oz)    Exam:  little change from previous day  General:  Alert and oriented  3, no acute distress  Cardiovascular: Regular rate and rhythm, S1-S2    Respiratory: Clear to auscultation bilaterally  Abdomen: Soft, nontender, nondistended, hypoactive bowel sounds  Musculoskeletal: 2+ pitting edema from the knees down with  chronic ulcerations and wounds wrapped from midcalf down, feet in pressure boots  Data Reviewed: Basic Metabolic Panel:  Recent Labs Lab 11/02/15 2015 11/02/15 2307 11/03/15 0528 11/04/15 0216 11/05/15 0400 11/07/15 0730  NA 140  --  141 140 141 137  K 6.4* 6.5* 5.9* 5.5* 4.7  4.6  CL 107  --  114* 111 109 104  CO2 20*  --  19* 22 21* 19*  GLUCOSE 63*  --  169* 275* 219* 225*  BUN 56*  --  41* 24* 15 12  CREATININE 2.23*  --  1.47* 0.99 0.95 0.95  CALCIUM 8.9  --  8.2* 8.1* 8.2* 8.4*   Liver Function Tests:  Recent Labs Lab 11/02/15 2015  AST 35  ALT 42  ALKPHOS 179*  BILITOT 0.6  PROT 7.3  ALBUMIN 2.5*   No results for input(s): LIPASE, AMYLASE in the last 168 hours. No results for input(s): AMMONIA in the last 168 hours. CBC:  Recent Labs Lab 11/02/15 2015 11/03/15 0528 11/03/15 1429 11/04/15 0216 11/05/15 0400 11/07/15 0730  WBC 20.1* 17.7*  --  15.6* 16.1* 15.4*  NEUTROABS 17.5*  --   --   --   --   --   HGB 7.1* 5.9* 6.1* 6.2* 6.0* 6.1*  HCT 23.0* 19.5* 20.5* 20.4* 20.1* 19.5*  MCV 61.5* 60.4*  --  61.6* 60.0* 59.5*  PLT 309 268  --  259 268 318   Cardiac Enzymes:    Recent Labs Lab 11/02/15 2015  TROPONINI 0.04*   BNP (last 3 results) No results for input(s): BNP in the last 8760 hours.  ProBNP (last 3 results) No results for input(s): PROBNP in the last 8760 hours.  CBG:  Recent Labs Lab 11/06/15 1239 11/06/15 1743 11/06/15 2316 11/07/15 0821 11/07/15 1215  GLUCAP 266* 226* 219* 249* 306*    Recent Results (from the past 240 hour(s))  Blood Culture (routine x 2)     Status: None (Preliminary result)   Collection Time: 11/02/15  8:15 PM  Result Value Ref Range Status   Specimen Description BLOOD RIGHT ANTECUBITAL  Final   Special Requests BOTTLES DRAWN AEROBIC AND ANAEROBIC 5CC  Final   Culture NO GROWTH 4 DAYS  Final   Report Status PENDING  Incomplete  Blood Culture (routine x 2)     Status: None (Preliminary result)   Collection Time: 11/02/15  8:35 PM  Result Value Ref Range Status   Specimen Description BLOOD LEFT ANTECUBITAL  Final   Special Requests BOTTLES DRAWN AEROBIC AND ANAEROBIC 3CC  Final   Culture NO GROWTH 4 DAYS  Final   Report Status PENDING  Incomplete  Urine culture     Status: None    Collection Time: 11/02/15 10:58 PM  Result Value Ref Range Status   Specimen Description URINE, CLEAN CATCH  Final   Special Requests NONE  Final   Culture MULTIPLE SPECIES PRESENT, SUGGEST RECOLLECTION  Final   Report Status 11/04/2015 FINAL  Final  C difficile quick scan w PCR reflex     Status: Abnormal   Collection Time: 11/03/15 12:47 AM  Result Value Ref Range Status   C Diff antigen POSITIVE (A) NEGATIVE Final   C Diff toxin NEGATIVE NEGATIVE Final   C Diff interpretation   Final    C. difficile present, but toxin not detected. This indicates colonization. In most cases, this does not require treatment. If patient has signs and symptoms consistent with colitis, consider treatment. Requires ENTERIC precautions.  Gastrointestinal Panel by PCR , Stool     Status: None   Collection Time: 11/03/15 12:47 AM  Result Value Ref Range Status   Campylobacter species NOT DETECTED NOT DETECTED Final   Plesimonas shigelloides NOT DETECTED NOT DETECTED Final   Salmonella species NOT DETECTED NOT DETECTED Final   Yersinia enterocolitica NOT DETECTED NOT DETECTED Final   Vibrio species NOT DETECTED NOT DETECTED Final   Vibrio cholerae NOT DETECTED NOT DETECTED Final   Enteroaggregative E coli (EAEC) NOT DETECTED NOT DETECTED Final   Enteropathogenic E coli (EPEC) NOT DETECTED NOT DETECTED Final   Enterotoxigenic E coli (ETEC) NOT DETECTED NOT DETECTED Final   Shiga like toxin producing E coli (STEC) NOT DETECTED NOT DETECTED Final   E. coli O157 NOT DETECTED NOT DETECTED Final   Shigella/Enteroinvasive E coli (EIEC) NOT DETECTED NOT DETECTED Final   Cryptosporidium NOT DETECTED NOT DETECTED Final   Cyclospora cayetanensis NOT DETECTED NOT DETECTED Final   Entamoeba histolytica NOT DETECTED NOT DETECTED Final   Giardia lamblia NOT DETECTED NOT DETECTED Final   Adenovirus F40/41 NOT DETECTED NOT DETECTED Final   Astrovirus NOT DETECTED NOT DETECTED Final   Norovirus GI/GII NOT DETECTED NOT  DETECTED Final   Rotavirus A NOT DETECTED NOT DETECTED Final   Sapovirus (I, II, IV, and V) NOT DETECTED NOT DETECTED Final  MRSA PCR Screening     Status: None   Collection Time: 11/04/15  9:23 AM  Result Value Ref Range Status   MRSA by PCR NEGATIVE NEGATIVE Final    Comment:        The GeneXpert MRSA Assay (FDA approved for NASAL specimens only), is one component of a comprehensive MRSA colonization surveillance program. It is not intended to diagnose MRSA infection nor to guide or monitor treatment for MRSA infections.      Studies: No results found.  Scheduled Meds: . sodium chloride   Intravenous Once  . atorvastatin  80 mg Oral Daily  . feeding supplement (ENSURE ENLIVE)  237 mL Oral BID  . feeding supplement (PRO-STAT SUGAR FREE 64)  30 mL Oral TID WC  . ferrous sulfate  325 mg Oral TID WC  . insulin aspart  0-20 Units Subcutaneous TID WC  . insulin aspart  0-5 Units Subcutaneous QHS  . insulin glargine  15 Units Subcutaneous QHS  . mirtazapine  15 mg Oral QHS  . multivitamin with minerals  1 tablet Oral Daily  . oxybutynin  10 mg Oral QHS  . oxyCODONE  20 mg Oral Q12H  . pantoprazole  40 mg Oral BID  . piperacillin-tazobactam (ZOSYN)  IV  3.375 g Intravenous 3 times per day  . pregabalin  75 mg Oral BID  . sodium chloride flush  3 mL Intravenous Q12H  . tamsulosin  0.4 mg Oral QPC supper  . vancomycin  1,000 mg Intravenous Q8H    Continuous Infusions: . sodium chloride 75 mL/hr at 11/06/15 1012     Time spent: 15 minutes   [facility] Pager [phone] . If 7PM-7AM, please contact night-coverage at [email or web address], password [facility] 11/07/2015, 2:44 PM  LOS: 5 days

## 2015-11-08 DIAGNOSIS — L97909 Non-pressure chronic ulcer of unspecified part of unspecified lower leg with unspecified severity: Secondary | ICD-10-CM

## 2015-11-08 LAB — PROTEIN ELECTROPHORESIS, SERUM
A/G Ratio: 0.4 — ABNORMAL LOW (ref 0.7–1.7)
Albumin ELP: 1.9 g/dL — ABNORMAL LOW (ref 2.9–4.4)
Alpha-1-Globulin: 0.6 g/dL — ABNORMAL HIGH (ref 0.0–0.4)
Alpha-2-Globulin: 1.1 g/dL — ABNORMAL HIGH (ref 0.4–1.0)
Beta Globulin: 1.2 g/dL (ref 0.7–1.3)
GAMMA GLOBULIN: 1.6 g/dL (ref 0.4–1.8)
Globulin, Total: 4.5 g/dL — ABNORMAL HIGH (ref 2.2–3.9)
TOTAL PROTEIN ELP: 6.4 g/dL (ref 6.0–8.5)

## 2015-11-08 LAB — RPR: RPR: NONREACTIVE

## 2015-11-08 LAB — MPO/PR-3 (ANCA) ANTIBODIES: Myeloperoxidase Abs: <9 U/mL (ref 0.0–9.0)

## 2015-11-08 LAB — COMPLEMENT, TOTAL

## 2015-11-08 LAB — C3 COMPLEMENT: C3 Complement: 218 mg/dL — ABNORMAL HIGH (ref 82–167)

## 2015-11-08 LAB — HEPATITIS B SURFACE ANTIGEN: Hepatitis B Surface Ag: NEGATIVE

## 2015-11-08 LAB — GLUCOSE, CAPILLARY
Glucose-Capillary: 277 mg/dL — ABNORMAL HIGH (ref 65–99)
Glucose-Capillary: 266 mg/dL — ABNORMAL HIGH (ref 65–99)
Glucose-Capillary: 201 mg/dL — ABNORMAL HIGH (ref 65–99)
Glucose-Capillary: 220 mg/dL — ABNORMAL HIGH (ref 65–99)

## 2015-11-08 LAB — ANTINUCLEAR ANTIBODIES, IFA: ANA Ab, IFA: NEGATIVE

## 2015-11-08 LAB — RHEUMATOID FACTOR: Rhuematoid fact SerPl-aCnc: 11.9 IU/mL (ref 0.0–13.9)

## 2015-11-08 LAB — C4 COMPLEMENT: COMPLEMENT C4, BODY FLUID: 26 mg/dL (ref 14–44)

## 2015-11-08 MED ORDER — LIDOCAINE-EPINEPHRINE 1 %-1:100000 IJ SOLN
10.0000 mL | Freq: Once | INTRAMUSCULAR | Status: DC
  Filled 2015-11-08: qty 10

## 2015-11-08 MED ORDER — INSULIN GLARGINE 100 UNIT/ML ~~LOC~~ SOLN
18.0000 [IU] | Freq: Every day | SUBCUTANEOUS | Status: DC
  Administered 2015-11-08: 18 [IU] via SUBCUTANEOUS
  Filled 2015-11-08 (×2): qty 0.18

## 2015-11-08 MED ORDER — VANCOMYCIN HCL IN DEXTROSE 1-5 GM/200ML-% IV SOLN
1000.0000 mg | Freq: Three times a day (TID) | INTRAVENOUS | Status: DC
  Administered 2015-11-08 – 2015-11-09 (×3): 1000 mg via INTRAVENOUS
  Filled 2015-11-08 (×4): qty 200

## 2015-11-08 MED ORDER — LIDOCAINE HCL (PF) 2 % IJ SOLN
0.0000 mL | Freq: Once | INTRAMUSCULAR | Status: DC | PRN
  Filled 2015-11-08: qty 20

## 2015-11-08 NOTE — Progress Notes (Signed)
PROGRESS NOTE  [name] [id number] DOB: 02-16-1956 DOA: 11/02/2015 PCP: Charlsie Merles, MD  HPI/Recap of past 56 hours: 60 year old male from a nursing facility with history of diabetes mellitus and chronic lower extremity ulcers admitted for low hemoglobin  (reportedly at 5) and sepsis secondary to lower extremity ulcers., Getting courses the patient is a Jehovah's Witness and refuses transfusions of blood. Patient noted to have hyperkalemia and acute kidney injury.  Monitored closely and hemoglobin has remained stable around 6.   Patient's biggest issue is that despite broad-spectrum antibiotics, he continued to spike fevers. Urine and chest x-ray and exam unrevealing. patient himself complains of chronic lower extremity pain from his ulcers. No new symptoms. Dopplers on admission ruled out DVT. CT scan done noted no signs of abscess. Infectious disease consulted who felt that persistent fever and leukocytosis might be questionable vasculitis? Plastic surgery consulted for biopsy   Assessment/Plan: Principal Problem:   Sepsis secondary to Cellulitis of bilateral lower extremities from chronic ulcers: Infectious disease consulted and ordered a vasculitis workup recommended continuing antibiotics for now. Recommended MRI, but CAT scan already done. Defer ordering to them. Awaiting plastic surgery evaluation and biopsy to rule out vasculitis.     acute kidney injury: Resolved.    DM (diabetes mellitus), secondary, uncontrolled, with peripheral vascular complications ([place]): CBGs not at goal. We will increase Lantus to 18 units.    Microcytic anemia. As mentioned before, patient is Jehovah's Witness. At this time no signs of active bleeding. Hemoglobin has ranged around 7-8 likely from chronic disease. Patient is on daily aspirin plus Mobic. Have stopped these for now , Dr. [name] Added PPI. No anemia workup since last summer. Will order. May benefit from Aranesp, but rule out  deficiencies first.     Hyperkalemia: resolved. Treated with Kayexalate.    diarrhea resolved: GI pathogen panel negative. C. difficile results consistent with colonization (toxin negative antigen positive )   needs out of bed. Had been walking with walker prior to admission. Had a prolonged stay at skilled nursing facility. Physical therapy recommends skilled nursing facility. Discontinue telemetry.  Code Status: Full code  Family Communication: None available. Patient is lucid.  Disposition Plan: awaiting plastic surgery evaluation.     Consultants:  Wound care    Infectious disease   plastic surgery  Procedures:  None    Antibiotics:  IV vancomycin 2/9-present  IV Zosyn 2/9-present    Subjective: Complains of right greater than left leg pain.   Objective: BP 135/71 mmHg  Pulse 91  Temp(Src) 99.1 F (37.3 C) (Oral)  Resp 20  Ht 6\' 4"  (1.93 m)  Wt 107.6 kg (237 lb 3.4 oz)  BMI 28.89 kg/m2  SpO2 97%  Intake/Output Summary (Last 24 hours) at 11/08/15 1153 Last data filed at 11/08/15 1132  Gross per 24 hour  Intake 1382.5 ml  Output   3075 ml  Net -1692.5 ml   Filed Weights   11/02/15 2100 11/07/15 0506  Weight: 99.9 kg (220 lb 3.8 oz) 107.6 kg (237 lb 3.4 oz)    Exam:   General:  Asleep. Arousable. Oriented.  Cardiovascular: Regular rate and rhythm, S1-S2    Respiratory: Clear to auscultation bilaterally  Abdomen: Soft, nontender, nondistended, hypoactive bowel sounds  Musculoskeletal: No edema. Probable on boots and Kerlix dressings in place.  Data Reviewed: Basic Metabolic Panel:  Recent Labs Lab 11/02/15 2015 11/02/15 2307 11/03/15 0528 11/04/15 0216 11/05/15 0400 11/07/15 0730  NA 140  --  141 140  141 137  K 6.4* 6.5* 5.9* 5.5* 4.7 4.6  CL 107  --  114* 111 109 104  CO2 20*  --  19* 22 21* 19*  GLUCOSE 63*  --  169* 275* 219* 225*  BUN 56*  --  41* 24* 15 12  CREATININE 2.23*  --  1.47* 0.99 0.95 0.95  CALCIUM 8.9  --  8.2*  8.1* 8.2* 8.4*   Liver Function Tests:  Recent Labs Lab 11/02/15 2015  AST 35  ALT 42  ALKPHOS 179*  BILITOT 0.6  PROT 7.3  ALBUMIN 2.5*   No results for input(s): LIPASE, AMYLASE in the last 168 hours. No results for input(s): AMMONIA in the last 168 hours. CBC:  Recent Labs Lab 11/02/15 2015 11/03/15 0528 11/03/15 1429 11/04/15 0216 11/05/15 0400 11/07/15 0730  WBC 20.1* 17.7*  --  15.6* 16.1* 15.4*  NEUTROABS 17.5*  --   --   --   --   --   HGB 7.1* 5.9* 6.1* 6.2* 6.0* 6.1*  HCT 23.0* 19.5* 20.5* 20.4* 20.1* 19.5*  MCV 61.5* 60.4*  --  61.6* 60.0* 59.5*  PLT 309 268  --  259 268 318   Cardiac Enzymes:    Recent Labs Lab 11/02/15 2015 11/07/15 1631  CKTOTAL  --  80  TROPONINI 0.04*  --    BNP (last 3 results) No results for input(s): BNP in the last 8760 hours.  ProBNP (last 3 results) No results for input(s): PROBNP in the last 8760 hours.  CBG:  Recent Labs Lab 11/07/15 0821 11/07/15 1215 11/07/15 1656 11/07/15 2302 11/08/15 0753  GLUCAP 249* 306* 260* 195* 277*    Recent Results (from the past 240 hour(s))  Blood Culture (routine x 2)     Status: None   Collection Time: 11/02/15  8:15 PM  Result Value Ref Range Status   Specimen Description BLOOD RIGHT ANTECUBITAL  Final   Special Requests BOTTLES DRAWN AEROBIC AND ANAEROBIC 5CC  Final   Culture NO GROWTH 5 DAYS  Final   Report Status 11/07/2015 FINAL  Final  Blood Culture (routine x 2)     Status: None   Collection Time: 11/02/15  8:35 PM  Result Value Ref Range Status   Specimen Description BLOOD LEFT ANTECUBITAL  Final   Special Requests BOTTLES DRAWN AEROBIC AND ANAEROBIC 3CC  Final   Culture NO GROWTH 5 DAYS  Final   Report Status 11/07/2015 FINAL  Final  Urine culture     Status: None   Collection Time: 11/02/15 10:58 PM  Result Value Ref Range Status   Specimen Description URINE, CLEAN CATCH  Final   Special Requests NONE  Final   Culture MULTIPLE SPECIES PRESENT, SUGGEST  RECOLLECTION  Final   Report Status 11/04/2015 FINAL  Final  C difficile quick scan w PCR reflex     Status: Abnormal   Collection Time: 11/03/15 12:47 AM  Result Value Ref Range Status   C Diff antigen POSITIVE (A) NEGATIVE Final   C Diff toxin NEGATIVE NEGATIVE Final   C Diff interpretation   Final    C. difficile present, but toxin not detected. This indicates colonization. In most cases, this does not require treatment. If patient has signs and symptoms consistent with colitis, consider treatment. Requires ENTERIC precautions.  Gastrointestinal Panel by PCR , Stool     Status: None   Collection Time: 11/03/15 12:47 AM  Result Value Ref Range Status   Campylobacter species NOT DETECTED NOT DETECTED  Final   Plesimonas shigelloides NOT DETECTED NOT DETECTED Final   Salmonella species NOT DETECTED NOT DETECTED Final   Yersinia enterocolitica NOT DETECTED NOT DETECTED Final   Vibrio species NOT DETECTED NOT DETECTED Final   Vibrio cholerae NOT DETECTED NOT DETECTED Final   Enteroaggregative E coli (EAEC) NOT DETECTED NOT DETECTED Final   Enteropathogenic E coli (EPEC) NOT DETECTED NOT DETECTED Final   Enterotoxigenic E coli (ETEC) NOT DETECTED NOT DETECTED Final   Shiga like toxin producing E coli (STEC) NOT DETECTED NOT DETECTED Final   E. coli O157 NOT DETECTED NOT DETECTED Final   Shigella/Enteroinvasive E coli (EIEC) NOT DETECTED NOT DETECTED Final   Cryptosporidium NOT DETECTED NOT DETECTED Final   Cyclospora cayetanensis NOT DETECTED NOT DETECTED Final   Entamoeba histolytica NOT DETECTED NOT DETECTED Final   Giardia lamblia NOT DETECTED NOT DETECTED Final   Adenovirus F40/41 NOT DETECTED NOT DETECTED Final   Astrovirus NOT DETECTED NOT DETECTED Final   Norovirus GI/GII NOT DETECTED NOT DETECTED Final   Rotavirus A NOT DETECTED NOT DETECTED Final   Sapovirus (I, II, IV, and V) NOT DETECTED NOT DETECTED Final  MRSA PCR Screening     Status: None   Collection Time: 11/04/15  9:23  AM  Result Value Ref Range Status   MRSA by PCR NEGATIVE NEGATIVE Final    Comment:        The GeneXpert MRSA Assay (FDA approved for NASAL specimens only), is one component of a comprehensive MRSA colonization surveillance program. It is not intended to diagnose MRSA infection nor to guide or monitor treatment for MRSA infections.      Studies: No results found.  Scheduled Meds: . sodium chloride   Intravenous Once  . atorvastatin  80 mg Oral Daily  . feeding supplement (ENSURE ENLIVE)  237 mL Oral BID  . feeding supplement (PRO-STAT SUGAR FREE 64)  30 mL Oral TID WC  . ferrous sulfate  325 mg Oral TID WC  . insulin aspart  0-20 Units Subcutaneous TID WC  . insulin aspart  0-5 Units Subcutaneous QHS  . insulin glargine  15 Units Subcutaneous QHS  . mirtazapine  15 mg Oral QHS  . multivitamin with minerals  1 tablet Oral Daily  . oxybutynin  10 mg Oral QHS  . oxyCODONE  20 mg Oral Q12H  . pantoprazole  40 mg Oral BID  . piperacillin-tazobactam (ZOSYN)  IV  3.375 g Intravenous 3 times per day  . pregabalin  75 mg Oral BID  . sodium chloride flush  3 mL Intravenous Q12H  . tamsulosin  0.4 mg Oral QPC supper  . vancomycin  1,000 mg Intravenous Q8H    Continuous Infusions: . sodium chloride 75 mL/hr at 11/07/15 1502     Time spent: 25 minutes   [facility] [facility] [email or web address], password [facility] 11/08/2015, 11:53 AM  LOS: 6 days

## 2015-11-08 NOTE — Procedures (Signed)
Operative Note   DATE OF OPERATION: 11/08/2015  LOCATION: [facility] Procedure  SURGICAL DIVISION: Plastic Surgery  PREOPERATIVE DIAGNOSES:  Bilateral leg wounds, concern for vasculitis  POSTOPERATIVE DIAGNOSES:  same  PROCEDURE:  1. Punch biopsy of left lower leg skin, proximal and distal 2. Punch biopsy of right lower leg skin, proximal and distal  SURGEON: Claire Sanger Dillingham, DO  ASSISTANT: Shawn Rayburn, PA  ANESTHESIA:  General.   COMPLICATIONS: None.   INDICATIONS FOR PROCEDURE:  The patient, [name] is a 60 y.o. male born on Jul 06, 1956, is here for treatment of bilateral lower leg wounds. MRN: [id number]  CONSENT:  Informed consent was obtained directly from the patient. Risks, benefits and alternatives were fully discussed. Specific risks including but not limited to bleeding, infection, hematoma, seroma, scarring, pain, infection, contracture, asymmetry, wound healing problems, and need for further surgery were all discussed. The patient did have an ample opportunity to have questions answered to satisfaction.   DESCRIPTION OF PROCEDURE:  The patient was in his bed in his hospital room. The legs were unwrapped and the operative site was prepped and draped in a sterile fashion. A time out was performed and all information was confirmed to be correct.  Local anesthesia was administered.  A 3 mm bunch biopsy was performed on the left leg proximally and distally.  A 3 mm punch biopsy was done on the right leg proximally and distally.  The areas were covered with 4 x 4 gauze and wrapped with kerlex.  The feet were placed in the bed cushions for heel protection. The patient tolerated the procedure well.  There were no complications. The specimens were sent to pathology labelled.

## 2015-11-08 NOTE — Progress Notes (Signed)
Pharmacy Antibiotic Note  [name] is a 60 y.o. male admitted on 11/02/2015 with cellulitis.  Pharmacy has been consulted for vanc/zosyn dosing - D#7. Afeb, wbc 15.4 (down). LA trend down to 0.8. ID consulted: recommend continuing abx for now and check for infection vs. Vasculitic wounds, consult plastics for possible biopsy and MRI to r/o deep tissue abscess. If vasculitis, treat with steroids. Missed 0415 vanc dose this AM - communicated with RN to give now and will check repeat VT@SS  when back on schedule. SCr stable 0.95, normalized CrCl~80, good UOP.  Plan: - Vancomycin at 1g IV q8h - Zosyn 3.375gm IV Q8H EI - f/u c/s, clinical progression, renal function, LOT - VT at new Css  Height: 6\' 4"  (193 cm) Weight: 237 lb 3.4 oz (107.6 kg) IBW/kg (Calculated) : 86.8  Temp (24hrs), Avg:99.3 F (37.4 C), Min:99.1 F (37.3 C), Max:99.5 F (37.5 C)   Recent Labs Lab 11/02/15 2015  11/02/15 2308 11/03/15 0220 11/03/15 0528 11/03/15 1623 11/04/15 0216 11/05/15 0400 11/05/15 1401 11/05/15 1608 11/05/15 1903 11/07/15 0730  WBC 20.1*  --   --   --  17.7*  --  15.6* 16.1*  --   --   --  15.4*  CREATININE 2.23*  --   --   --  1.47*  --  0.99 0.95  --   --   --  0.95  LATICACIDVEN  --   < > 1.7 2.2*  --  0.7  --   --  0.8 0.8  --   --   VANCOTROUGH  --   --   --   --   --   --   --   --   --   --  5*  --   < > = values in this interval not displayed.  Estimated Creatinine Clearance: 109.8 mL/min (by C-G formula based on Cr of 0.95).    Allergies  Allergen Reactions  . Other     Pt is a Jehovah Witness. No blood products.  . Sulfa Antibiotics     Causes flu-like symptom : sweating, chills, fever, body aches    Antimicrobials this admission: Vanc  2/9>>  Zosyn 2/9 >>   Dose adjustments this admission: 2/12 VT=5 - increased to 1g q8h  Microbiology results: 2/9 BCx: ngf 2/9 UCx:  mult sp, recollect 2/9 Cdiff PCR:  (+)Ag, (-)toxin - colonization 2/10 GI PCR- all neg 2/11 MRSA  PCR: neg  Elicia Lamp, PharmD, BCPS Clinical Pharmacist Pager [phone] 11/08/2015 9:05 AM

## 2015-11-08 NOTE — Progress Notes (Signed)
   11/08/15 1606  Clinical Encounter Type  Visited With Patient;Health care provider  Visit Type Initial;Spiritual support  Referral From Patient  Spiritual Encounters  Spiritual Needs Prayer;Sacred text   Chaplain responded to a request from the patient for some prayer, a Bible, and religious support. Chaplain provided prayer, will follow-up with a Bible and see about getting someone from a local [place] to support his faith. Chaplain services available as needed.   Jeri Lager, Chaplain 11/08/2015 4:08 PM

## 2015-11-08 NOTE — Progress Notes (Signed)
[facility] for Infectious Disease    Date of Admission:  11/02/2015   Total days of antibiotics 7        Day 7 vanco        Day 7 piptazo           ID: [name] is a 60 y.o. male with chronic lower extremity wounds plus fevers. Also colonized with c.difficile Principal Problem:   Cellulitis of leg Active Problems:   Sepsis ([place])   DM (diabetes mellitus), secondary, uncontrolled, with peripheral vascular complications (HCC)   Microcytic anemia   Leg ulcer (HCC)   AKI (acute kidney injury) (HCC)   Hyperkalemia   Hypoglycemia    Subjective: Afebrile x 24hr. He reports that he has not had any bowel movements today  Interval hx: underwent skin biopsy today  Medications:  . sodium chloride   Intravenous Once  . atorvastatin  80 mg Oral Daily  . feeding supplement (ENSURE ENLIVE)  237 mL Oral BID  . feeding supplement (PRO-STAT SUGAR FREE 64)  30 mL Oral TID WC  . ferrous sulfate  325 mg Oral TID WC  . insulin aspart  0-20 Units Subcutaneous TID WC  . insulin aspart  0-5 Units Subcutaneous QHS  . insulin glargine  18 Units Subcutaneous QHS  . lidocaine-EPINEPHrine  10 mL Intradermal Once  . mirtazapine  15 mg Oral QHS  . multivitamin with minerals  1 tablet Oral Daily  . oxybutynin  10 mg Oral QHS  . oxyCODONE  20 mg Oral Q12H  . pantoprazole  40 mg Oral BID  . piperacillin-tazobactam (ZOSYN)  IV  3.375 g Intravenous 3 times per day  . pregabalin  75 mg Oral BID  . sodium chloride flush  3 mL Intravenous Q12H  . tamsulosin  0.4 mg Oral QPC supper  . vancomycin  1,000 mg Intravenous Q8H    Objective: Vital signs in last 24 hours: Temp:  [98.4 F (36.9 C)-99.2 F (37.3 C)] 98.4 F (36.9 C) (02/15 1451) Pulse Rate:  [88] 88 (02/15 1451) Resp:  [20] 20 (02/15 1451) BP: (132-140)/(68-74) 140/74 mmHg (02/15 1451) SpO2:  [91 %-97 %] 91 % (02/15 1451) Physical Exam  Constitutional: He is oriented to person, place, and time. He appears well-developed and  well-nourished. No distress.  HENT:  Mouth/Throat: Oropharynx is clear and moist. No oropharyngeal exudate.  Cardiovascular: Normal rate, regular rhythm and normal heart sounds. Exam reveals no gallop and no friction rub.  No murmur [place].  Pulmonary/Chest: Effort normal and breath sounds normal. No respiratory distress. He has no wheezes.  Abdominal: Soft. Bowel sounds are normal. He exhibits no distension. There is no tenderness.  Lymphadenopathy:  He has no cervical adenopathy.  Neurological: He is alert and oriented to person, place, and time.  Skin: Skin is warm and dry. Cracked hands. Lower extremities wrapped Psychiatric: He has a normal mood and affect. His behavior is normal.    Lab Results  Recent Labs  11/07/15 0730  WBC 15.4*  HGB 6.1*  HCT 19.5*  NA 137  K 4.6  CL 104  CO2 19*  BUN 12  CREATININE 0.95   Lab Results  Component Value Date   ESRSEDRATE 113* 11/02/2015    C-Reactive Protein  Recent Labs  11/07/15 1631  CRP 29.9*    Microbiology: 2/9 blood cx ngtd 2/10 gi pathogen panel is negative 2/10 cdifficile antigen +/toxin negative  Studies/Results: No results found.   Assessment/Plan: Chronic leg ulcer =appreciate  skin biopsies. There maybe miscommunication with plastic surgery that we would also want formal consultation (not just assistance with performing biopsy). Concern that etiology maybe vasculitis. Work up thus far is unrevealing. continue to treat for infection with 10 day course of abtx. Afebrile x 24hr. Await path results. Though he has clear signs of inflammation with very elevated markers  Fever = appears improving  C.difficile colonization = he is at risk for having disease due to broad spectrum abtx. Currently asymptomatic. If he starts to have 3+ watery stools per day, recommend to treat with oral vanco 125mg  QID.  Dr Johnnye Sima to provide further recs    [name], [facility] for Infectious Diseases Cell:  [phone] Pager: [phone]  11/08/2015:3:30pm

## 2015-11-09 DIAGNOSIS — Z221 Carrier of other intestinal infectious diseases: Secondary | ICD-10-CM

## 2015-11-09 DIAGNOSIS — L97929 Non-pressure chronic ulcer of unspecified part of left lower leg with unspecified severity: Secondary | ICD-10-CM

## 2015-11-09 DIAGNOSIS — A419 Sepsis, unspecified organism: Principal | ICD-10-CM

## 2015-11-09 DIAGNOSIS — L97919 Non-pressure chronic ulcer of unspecified part of right lower leg with unspecified severity: Secondary | ICD-10-CM

## 2015-11-09 DIAGNOSIS — E1165 Type 2 diabetes mellitus with hyperglycemia: Secondary | ICD-10-CM

## 2015-11-09 LAB — CBC
HCT: 20.6 % — ABNORMAL LOW (ref 39.0–52.0)
Hemoglobin: 6.1 g/dL — CL (ref 13.0–17.0)
MCH: 17.5 pg — ABNORMAL LOW (ref 26.0–34.0)
MCHC: 29.6 g/dL — ABNORMAL LOW (ref 30.0–36.0)
MCV: 59.2 fL — AB (ref 78.0–100.0)
PLATELETS: 373 10*3/uL (ref 150–400)
RBC: 3.48 MIL/uL — AB (ref 4.22–5.81)
RDW: 18.2 % — AB (ref 11.5–15.5)
WBC: 11.7 10*3/uL — AB (ref 4.0–10.5)

## 2015-11-09 LAB — VITAMIN B12: VITAMIN B 12: 250 pg/mL (ref 180–914)

## 2015-11-09 LAB — ANCA TITERS
Atypical P-ANCA titer: <1:20 {titer}
C-ANCA: <1:20 {titer}
P-ANCA: <1:20 {titer}

## 2015-11-09 LAB — GLUCOSE, CAPILLARY
GLUCOSE-CAPILLARY: 230 mg/dL — AB (ref 65–99)
Glucose-Capillary: 231 mg/dL — ABNORMAL HIGH (ref 65–99)
Glucose-Capillary: 267 mg/dL — ABNORMAL HIGH (ref 65–99)
Glucose-Capillary: 203 mg/dL — ABNORMAL HIGH (ref 65–99)

## 2015-11-09 LAB — IRON AND TIBC
IRON: 12 ug/dL — AB (ref 45–182)
SATURATION RATIOS: 6 % — AB (ref 17.9–39.5)
TIBC: 196 ug/dL — ABNORMAL LOW (ref 250–450)
UIBC: 184 ug/dL

## 2015-11-09 LAB — FERRITIN: Ferritin: 324 ng/mL (ref 24–336)

## 2015-11-09 LAB — FOLATE: Folate: 29.1 ng/mL (ref >5.9)

## 2015-11-09 LAB — VANCOMYCIN, TROUGH: VANCOMYCIN TR: 22 ug/mL — AB (ref 10.0–20.0)

## 2015-11-09 MED ORDER — SODIUM CHLORIDE 0.9 % IV SOLN
510.0000 mg | Freq: Once | INTRAVENOUS | Status: AC
  Administered 2015-11-09: 510 mg via INTRAVENOUS
  Filled 2015-11-09 (×2): qty 17

## 2015-11-09 MED ORDER — VANCOMYCIN HCL IN DEXTROSE 1-5 GM/200ML-% IV SOLN
1000.0000 mg | Freq: Two times a day (BID) | INTRAVENOUS | Status: DC
  Administered 2015-11-09 – 2015-11-10 (×2): 1000 mg via INTRAVENOUS
  Filled 2015-11-09 (×4): qty 200

## 2015-11-09 MED ORDER — VANCOMYCIN HCL IN DEXTROSE 750-5 MG/150ML-% IV SOLN
750.0000 mg | Freq: Three times a day (TID) | INTRAVENOUS | Status: DC
  Filled 2015-11-09: qty 150

## 2015-11-09 MED ORDER — DARBEPOETIN ALFA 60 MCG/0.3ML IJ SOSY
60.0000 ug | PREFILLED_SYRINGE | INTRAMUSCULAR | Status: DC
  Administered 2015-11-09: 60 ug via SUBCUTANEOUS
  Filled 2015-11-09: qty 0.3

## 2015-11-09 MED ORDER — INSULIN GLARGINE 100 UNIT/ML ~~LOC~~ SOLN
21.0000 [IU] | Freq: Every day | SUBCUTANEOUS | Status: DC
  Administered 2015-11-09 – 2015-11-11 (×3): 21 [IU] via SUBCUTANEOUS
  Filled 2015-11-09 (×4): qty 0.21

## 2015-11-09 NOTE — Progress Notes (Signed)
Physical Therapy Treatment Patient Details Name: [name] MRN: [id number] DOB: 1955-12-15 Today's Date: 11/09/2015    History of Present Illness Patient is a 60 y.o. male with hx of DM, hyperlipidemia, iron deficiency anemia, chronic LE ulcers, degenerative hip, depression, HTN presented with low hemoglobin and sepsis secondary to lower extremity ulcers. Pt is a Jehovah's Witness so refusing blood transfusion. Is also noted to have hyperkalemia and acute kidney injury.     PT Comments    PTA and OT used stedy for transfer from bed to chair.  Pt required +2 max assist as chronic decreased hip flexion limits patients ability to stand.  RN informed to use maximove for back to bed.    Follow Up Recommendations  SNF     Equipment Recommendations  None recommended by PT    Recommendations for Other Services       Precautions / Restrictions Precautions Precautions: Fall Precaution Comments: remains to have B wraps around BLE ulcers.   Restrictions Weight Bearing Restrictions: No    Mobility  Bed Mobility Overal bed mobility: Needs Assistance;+2 for physical assistance Bed Mobility: Supine to Sit     Supine to sit: +2 for physical assistance;Max assist (entered room with OT present who was advancing to edge of bed, pt required +2 assist to boost trunk into sitting edge of bed, however pt remains to come short of sitting erect due to decreased hip felxion B.  )     General bed mobility comments: Pt required max assist to perform with cues for pacing and breathing to improve comfort.  Pt persistently complaining of R hip and LE pain.     Transfers Overall transfer level: Needs assistance   Transfers: Sit to/from Stand Sit to Stand: +2 physical assistance;Max assist         General transfer comment: Pt too tall to fit correctly with lift equipment required assist from PTA to pull into standing in lieu of grab bar while OT pushed B hips into ext.  Pt able to reach grab bar  durinf pivot with maximove.    Ambulation/Gait Ambulation/Gait assistance:  (remains unable at this time.  )               Stairs            Wheelchair Mobility    Modified Rankin (Stroke Patients Only)       Balance     Sitting balance-Leahy Scale: Zero       Standing balance-Leahy Scale: Zero                      Cognition Arousal/Alertness: Awake/alert Behavior During Therapy: WFL for tasks assessed/performed Overall Cognitive Status: Within Functional Limits for tasks assessed                      Exercises      General Comments        Pertinent Vitals/Pain Pain Assessment: Faces Faces Pain Scale: Hurts whole lot Pain Location: RLE and hips B Pain Descriptors / Indicators: Grimacing;Guarding Pain Intervention(s): Monitored during session;Repositioned    Home Living                      Prior Function            PT Goals (current goals can now be found in the care plan section) Acute Rehab PT Goals Patient Stated Goal: get back to how he was before Potential  to Achieve Goals: Fair Progress towards PT goals: Progressing toward goals    Frequency  Min 2X/week    PT Plan Current plan remains appropriate    Co-evaluation   Reason for Co-Treatment: Complexity of the patient's impairments (multi-system involvement);For patient/therapist safety   OT goals addressed during session: ADL's and self-care;Other (comment) (Sitting EOB and Mobility/safety in preparation for increased participation w/ ADL's)     End of Session Equipment Utilized During Treatment: Gait belt Activity Tolerance: No increased pain Patient left: in chair;with call bell/phone within reach     Time: [id number] PT Time Calculation (min) (ACUTE ONLY): 23 min  Charges:  $Therapeutic Activity: 8-22 mins                    G Codes:      Cristela Blue Dec 09, 2015, 11:33 AM  Governor Rooks, PTA pager [phone]

## 2015-11-09 NOTE — Progress Notes (Signed)
INFECTIOUS DISEASE PROGRESS NOTE  ID: [name] is a 60 y.o. male with  Principal Problem:   Cellulitis of leg Active Problems:   Sepsis ([place])   DM (diabetes mellitus), secondary, uncontrolled, with peripheral vascular complications (HCC)   Microcytic anemia   Leg ulcer ([place])   AKI (acute kidney injury) ([place])   Hyperkalemia   Hypoglycemia  Subjective: C/o pain in his LE  Abtx:  Anti-infectives    Start     Dose/Rate Route Frequency Ordered Stop   11/09/15 1600  vancomycin (VANCOCIN) IVPB 750 mg/150 ml premix  Status:  Discontinued     750 mg 150 mL/hr over 60 Minutes Intravenous Every 8 hours 11/09/15 1101 11/09/15 1133   11/09/15 1500  vancomycin (VANCOCIN) IVPB 1000 mg/200 mL premix     1,000 mg 200 mL/hr over 60 Minutes Intravenous Every 12 hours 11/09/15 1133     11/08/15 1830  vancomycin (VANCOCIN) IVPB 1000 mg/200 mL premix  Status:  Discontinued     1,000 mg 200 mL/hr over 60 Minutes Intravenous Every 8 hours 11/08/15 1154 11/09/15 1101   11/05/15 2015  vancomycin (VANCOCIN) IVPB 1000 mg/200 mL premix  Status:  Discontinued     1,000 mg 200 mL/hr over 60 Minutes Intravenous Every 8 hours 11/05/15 2005 11/08/15 1154   11/03/15 2000  vancomycin (VANCOCIN) IVPB 1000 mg/200 mL premix  Status:  Discontinued     1,000 mg 200 mL/hr over 60 Minutes Intravenous Every 24 hours 11/03/15 0024 11/05/15 2005   11/03/15 0600  piperacillin-tazobactam (ZOSYN) IVPB 3.375 g     3.375 g 12.5 mL/hr over 240 Minutes Intravenous 3 times per day 11/03/15 0024     11/02/15 2045  vancomycin (VANCOCIN) 1,500 mg in sodium chloride 0.9 % 500 mL IVPB     1,500 mg 250 mL/hr over 120 Minutes Intravenous  Once 11/02/15 2040 11/02/15 2257   11/02/15 2030  piperacillin-tazobactam (ZOSYN) IVPB 3.375 g     3.375 g 100 mL/hr over 30 Minutes Intravenous  Once 11/02/15 2024 11/02/15 2318   11/02/15 2030  vancomycin (VANCOCIN) IVPB 1000 mg/200 mL premix  Status:  Discontinued     1,000 mg 200 mL/hr  over 60 Minutes Intravenous  Once 11/02/15 2024 11/02/15 2040      Medications:  Scheduled: . sodium chloride   Intravenous Once  . atorvastatin  80 mg Oral Daily  . darbepoetin (ARANESP) injection - NON-DIALYSIS  60 mcg Subcutaneous Q Thu-1800  . feeding supplement (ENSURE ENLIVE)  237 mL Oral BID  . feeding supplement (PRO-STAT SUGAR FREE 64)  30 mL Oral TID WC  . ferrous sulfate  325 mg Oral TID WC  . ferumoxytol  510 mg Intravenous Once  . insulin aspart  0-20 Units Subcutaneous TID WC  . insulin aspart  0-5 Units Subcutaneous QHS  . insulin glargine  21 Units Subcutaneous QHS  . lidocaine-EPINEPHrine  10 mL Intradermal Once  . mirtazapine  15 mg Oral QHS  . multivitamin with minerals  1 tablet Oral Daily  . oxybutynin  10 mg Oral QHS  . oxyCODONE  20 mg Oral Q12H  . pantoprazole  40 mg Oral BID  . piperacillin-tazobactam (ZOSYN)  IV  3.375 g Intravenous 3 times per day  . pregabalin  75 mg Oral BID  . sodium chloride flush  3 mL Intravenous Q12H  . tamsulosin  0.4 mg Oral QPC supper  . vancomycin  1,000 mg Intravenous Q12H    Objective: Vital signs in last  24 hours: Temp:  [98.4 F (36.9 C)-99.9 F (37.7 C)] 99.9 F (37.7 C) (02/16 0653) Pulse Rate:  [88-89] 89 (02/16 0653) Resp:  [15-20] 15 (02/16 0653) BP: (132-140)/(68-77) 136/68 mmHg (02/16 0653) SpO2:  [91 %-93 %] 91 % (02/16 0653)   General appearance: alert, cooperative, no distress and mild complaints of pain Incision/Wound: LE dressed.pain with palpation around knees. R leg larger then L.   Lab Results  Recent Labs  11/07/15 0730 11/09/15 0759  WBC 15.4* 11.7*  HGB 6.1* 6.1*  HCT 19.5* 20.6*  NA 137  --   K 4.6  --   CL 104  --   CO2 19*  --   BUN 12  --   CREATININE 0.95  --    Liver Panel No results for input(s): PROT, ALBUMIN, AST, ALT, ALKPHOS, BILITOT, BILIDIR, IBILI in the last 72 hours. Sedimentation Rate No results for input(s): ESRSEDRATE in the last 72 hours. C-Reactive  Protein  Recent Labs  11/07/15 1631  CRP 29.9*    Microbiology: Recent Results (from the past 240 hour(s))  Blood Culture (routine x 2)     Status: None   Collection Time: 11/02/15  8:15 PM  Result Value Ref Range Status   Specimen Description BLOOD RIGHT ANTECUBITAL  Final   Special Requests BOTTLES DRAWN AEROBIC AND ANAEROBIC 5CC  Final   Culture NO GROWTH 5 DAYS  Final   Report Status 11/07/2015 FINAL  Final  Blood Culture (routine x 2)     Status: None   Collection Time: 11/02/15  8:35 PM  Result Value Ref Range Status   Specimen Description BLOOD LEFT ANTECUBITAL  Final   Special Requests BOTTLES DRAWN AEROBIC AND ANAEROBIC 3CC  Final   Culture NO GROWTH 5 DAYS  Final   Report Status 11/07/2015 FINAL  Final  Urine culture     Status: None   Collection Time: 11/02/15 10:58 PM  Result Value Ref Range Status   Specimen Description URINE, CLEAN CATCH  Final   Special Requests NONE  Final   Culture MULTIPLE SPECIES PRESENT, SUGGEST RECOLLECTION  Final   Report Status 11/04/2015 FINAL  Final  C difficile quick scan w PCR reflex     Status: Abnormal   Collection Time: 11/03/15 12:47 AM  Result Value Ref Range Status   C Diff antigen POSITIVE (A) NEGATIVE Final   C Diff toxin NEGATIVE NEGATIVE Final   C Diff interpretation   Final    C. difficile present, but toxin not detected. This indicates colonization. In most cases, this does not require treatment. If patient has signs and symptoms consistent with colitis, consider treatment. Requires ENTERIC precautions.  Gastrointestinal Panel by PCR , Stool     Status: None   Collection Time: 11/03/15 12:47 AM  Result Value Ref Range Status   Campylobacter species NOT DETECTED NOT DETECTED Final   Plesimonas shigelloides NOT DETECTED NOT DETECTED Final   Salmonella species NOT DETECTED NOT DETECTED Final   Yersinia enterocolitica NOT DETECTED NOT DETECTED Final   Vibrio species NOT DETECTED NOT DETECTED Final   Vibrio cholerae NOT  DETECTED NOT DETECTED Final   Enteroaggregative E coli (EAEC) NOT DETECTED NOT DETECTED Final   Enteropathogenic E coli (EPEC) NOT DETECTED NOT DETECTED Final   Enterotoxigenic E coli (ETEC) NOT DETECTED NOT DETECTED Final   Shiga like toxin producing E coli (STEC) NOT DETECTED NOT DETECTED Final   E. coli O157 NOT DETECTED NOT DETECTED Final   Shigella/Enteroinvasive E coli (  EIEC) NOT DETECTED NOT DETECTED Final   Cryptosporidium NOT DETECTED NOT DETECTED Final   Cyclospora cayetanensis NOT DETECTED NOT DETECTED Final   Entamoeba histolytica NOT DETECTED NOT DETECTED Final   Giardia lamblia NOT DETECTED NOT DETECTED Final   Adenovirus F40/41 NOT DETECTED NOT DETECTED Final   Astrovirus NOT DETECTED NOT DETECTED Final   Norovirus GI/GII NOT DETECTED NOT DETECTED Final   Rotavirus A NOT DETECTED NOT DETECTED Final   Sapovirus (I, II, IV, and V) NOT DETECTED NOT DETECTED Final  MRSA PCR Screening     Status: None   Collection Time: 11/04/15  9:23 AM  Result Value Ref Range Status   MRSA by PCR NEGATIVE NEGATIVE Final    Comment:        The GeneXpert MRSA Assay (FDA approved for NASAL specimens only), is one component of a comprehensive MRSA colonization surveillance program. It is not intended to diagnose MRSA infection nor to guide or monitor treatment for MRSA infections.     Studies/Results: No results found.   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

## 2015-11-09 NOTE — Progress Notes (Signed)
Occupational Therapy Treatment Patient Details Name: [name] MRN: [id number] DOB: [birth year] Today's Date: 11/09/2015    History of present illness Patient is a 60 y.o. male with hx of DM, hyperlipidemia, iron deficiency anemia, chronic LE ulcers, degenerative hip, depression, HTN presented with low hemoglobin and sepsis secondary to lower extremity ulcers. Pt is a Jehovah's Witness so refusing blood transfusion. Is also noted to have hyperkalemia and acute kidney injury.    OT comments  Pt participated in ADL retraining session with focus on bed mobility, sitting EOB/balance and then transfer/co-tx w/ PT using Steady for EOB to recliner today. He was initially seen by OT only & was Mod-max A bed mobility and then sat EOB w/ mod-max A +2 secondary to strong posterior lean due to left hip pain/decreased flexion and decreased flexion at bilateral knees. Pt then participated in co-tx w/ PT for functional transfer from EOB to chair using Steady. Pt was overall Max A x2. Pt was educated in Role of OT and goal to sit up & begin performing activities at EOB to allow for increased participation in ADL's.   Follow Up Recommendations  SNF    Equipment Recommendations  Other (comment) (Defer to next venue)    Recommendations for Other Services      Precautions / Restrictions Precautions Precautions: Fall Precaution Comments: remains to have B wraps around BLE ulcers.   Restrictions Weight Bearing Restrictions: No       Mobility Bed Mobility Overal bed mobility: Needs Assistance  Rolling: Mod - Max assist (Pt required cues for hands on assist for LE's and +2 max/physical assist to sit EOB)    Supine to sit: +2 for physical assistance;Max assist     .    Transfers Overall transfer level: Needs assistance   Transfers: Sit to/from Stand Sit to Stand: +2 physical assistance;Max assist         General transfer comment: Pt too tall to fit correctly with lift equipment required  assist from PTA to pull into standing in lieu of grab bar while OT pushed B hips into ext.  Pt able to reach grab bar during pivot with Steady.      Balance Overall balance assessment: Needs assistance Sitting-balance support: Feet supported Sitting balance-Leahy Scale: Zero Sitting balance - Comments: Requires Mod-Max A for half sitting EOB; not able to flex left hip so leaning heavily posteriorly.  Postural control: Posterior lean   Standing balance-Leahy Scale: Zero                     ADL Overall ADL's : Needs assistance/impaired     Grooming: Wash/dry hands;Wash/dry face;Sitting;Maximal assistance Grooming Details (indicate cue type and reason): Max A to wash face in sitting EOB, pt has strong posterior lean secondary to L hip pain/decreased flexion as well as decreased flexion bilateral knees as well.                             Functional mobility during ADLs: Maximal assistance;+2 for physical assistance;+2 for safety/equipment (Using steady in room for EOB to chair transfer) General ADL Comments: Pt participated in ADL retraining session with focus on bed mobility, sitting EOB/balance and then transfer/co-tx w/ PT using Steady for EOB to recliner today. He was initially seen by OT only & was Mod A bed mobility and then sat EOB w/ min-max A secondary to strong posterior lean due to left hip pain/decreased flexion and decreased flexion  at bilateral knees. Attempted EOB sitting prior to co-tx w/ PT in praration for increased participation in ADL's.  Pt then participated in co-tx w/ PT for functional transfer from EOB to chair using Steady. Pt was overall Max A x2. Pt was educated in Role of OT and goal to sit up & begin performing activities at EOB to allow for increased participation in ADL's.      Vision  NT, No change from baseline                   Perception     Praxis      Cognition   Behavior During Therapy: WFL for tasks  assessed/performed Overall Cognitive Status: Within Functional Limits for tasks assessed                       Extremity/Trunk Assessment               Exercises     Shoulder Instructions       General Comments      Pertinent Vitals/ Pain       Pain Assessment: Faces Faces Pain Scale: Hurts whole lot Pain Location: RLE and left hip Pain Descriptors / Indicators: Grimacing;Guarding Pain Intervention(s): Monitored during session;Repositioned  Home Living                                          Prior Functioning/Environment              Frequency Min 2X/week     Progress Toward Goals  OT Goals(current goals can now be found in the care plan section)  Progress towards OT goals: Not progressing toward goals - comment (Pt cont to require +2 max A for functional transfers; pain is limiting factor)  Acute Rehab OT Goals Patient Stated Goal: Get back to how he was before & return to SNF  Plan Discharge plan remains appropriate    Co-treatment    PT/OT/SLP Co-Evaluation/Treatment: Yes Reason for Co-Treatment: Complexity of the patient's impairments (multi-system involvement);For patient/therapist safety   OT goals addressed during session: ADL's and self-care;Other (comment) (Sitting EOB and Mobility/safety in preparation for increased participation w/ ADL's)      End of Session Equipment Utilized During Treatment: Gait belt;Other (comment) (Steady)   Activity Tolerance Patient limited by pain   Patient Left in chair;with call bell/phone within reach   Nurse Communication Mobility status;Other (comment) (Recommend lift/Steady +2 Assist for back to bed)        Time: [id number] OT Time Calculation (min): 39 min  Charges: OT General Charges $OT Visit: 1 Procedure OT Treatments $Self Care/Home Management : 8-22 mins $Therapeutic Activity: 8-22 mins  Barnhill, Amy Beth Dixon, OTR/L 11/09/2015, 11:40 AM

## 2015-11-09 NOTE — Progress Notes (Addendum)
Pharmacy Antibiotic Note  [name] is a 61 y.o. male admitted on 11/02/2015 with cellulitis/chronic leg ulcers Vs. Vasculitis?. Pharmacy has been consulted for vanc/zosyn dosing - D#8. Afeb, wbc 15.4 (down). LA trend down to 0.8. ID consulted: recommend continuing abx for now and check for infection vs. Vasculitic wounds, consult to plastics. Now s/p biopsy and may get MRI to r/o deep tissue abscess.  Repeat VT now high (22). RN charted next vanc dose as hung but actually never was started. Will restart at lower dose at 1500 tonight and recheck VT at new Css (target level closer to 15). SCr stable 0.95, normalized CrCl~80, good UOP.  Plan: - Decrease Vanc to 1g IV q12h - start at 1500 tonight - Zosyn 3.375gm IV Q8H EI - F/u Derm pathology s/p biopsy - f/u c/s, clinical progression, renal function, LOT - VT at new Css. Target closer to 15 for now  Height: 6\' 4"  (193 cm) Weight: 237 lb 3.4 oz (107.6 kg) IBW/kg (Calculated) : 86.8  Temp (24hrs), Avg:99.2 F (37.3 C), Min:98.4 F (36.9 C), Max:99.9 F (37.7 C)   Recent Labs Lab 11/02/15 2015  11/02/15 2308 11/03/15 0220 11/03/15 0528 11/03/15 1623 11/04/15 0216 11/05/15 0400 11/05/15 1401 11/05/15 1608 11/05/15 1903 11/07/15 0730 11/09/15 0759 11/09/15 0931  WBC 20.1*  --   --   --  17.7*  --  15.6* 16.1*  --   --   --  15.4* 11.7*  --   CREATININE 2.23*  --   --   --  1.47*  --  0.99 0.95  --   --   --  0.95  --   --   LATICACIDVEN  --   < > 1.7 2.2*  --  0.7  --   --  0.8 0.8  --   --   --   --   VANCOTROUGH  --   --   --   --   --   --   --   --   --   --  5*  --   --  22*  < > = values in this interval not displayed.  Estimated Creatinine Clearance: 109.8 mL/min (by C-G formula based on Cr of 0.95).    Allergies  Allergen Reactions  . Other     Pt is a Jehovah Witness. No blood products.  . Sulfa Antibiotics     Causes flu-like symptom : sweating, chills, fever, body aches    Antimicrobials this admission: Vanc   2/9>>  Zosyn 2/9 >>   Dose adjustments this admission: 2/12 VT=5 - increased to 1g q8h 2/16 VT=22 - decreased to 1g q12h  Microbiology results: 2/9 BCx: ngf 2/9 UCx:  mult sp, recollect 2/9 Cdiff PCR:  (+)Ag, (-)toxin - colonization 2/10 GI PCR- all neg 2/11 MRSA PCR: neg  Elicia Lamp, PharmD, BCPS Clinical Pharmacist Pager [phone] 11/09/2015 10:35 AM

## 2015-11-09 NOTE — Progress Notes (Signed)
MEDICATION RELATED CONSULT NOTE - INITIAL   Pharmacy Consult for Aranesp Indication: Anemia of Chronic Disease  Allergies  Allergen Reactions  . Other     Pt is a Jehovah Witness. No blood products.  . Sulfa Antibiotics     Causes flu-like symptom : sweating, chills, fever, body aches    Patient Measurements: Height: 6\' 4"  (193 cm) Weight: 237 lb 3.4 oz (107.6 kg) IBW/kg (Calculated) : 86.8 Adjusted Body Weight:   Vital Signs: Temp: 99.9 F (37.7 C) (02/16 0653) Temp Source: Oral (02/16 0653) BP: 136/68 mmHg (02/16 0653) Pulse Rate: 89 (02/16 0653) Intake/Output from previous day: 02/15 0701 - 02/16 0700 In: 2992.5 [P.O.:720; I.V.:2022.5; IV Piggyback:250] Out: 4200 [Urine:4200] Intake/Output from this shift: Total I/O In: 240 [P.O.:240] Out: -   Labs:  Recent Labs  11/07/15 0730 11/09/15 0759  WBC 15.4* 11.7*  HGB 6.1* 6.1*  HCT 19.5* 20.6*  PLT 318 373  CREATININE 0.95  --    Estimated Creatinine Clearance: 109.8 mL/min (by C-G formula based on Cr of 0.95).   Microbiology: Recent Results (from the past 720 hour(s))  Blood Culture (routine x 2)     Status: None   Collection Time: 11/02/15  8:15 PM  Result Value Ref Range Status   Specimen Description BLOOD RIGHT ANTECUBITAL  Final   Special Requests BOTTLES DRAWN AEROBIC AND ANAEROBIC 5CC  Final   Culture NO GROWTH 5 DAYS  Final   Report Status 11/07/2015 FINAL  Final  Blood Culture (routine x 2)     Status: None   Collection Time: 11/02/15  8:35 PM  Result Value Ref Range Status   Specimen Description BLOOD LEFT ANTECUBITAL  Final   Special Requests BOTTLES DRAWN AEROBIC AND ANAEROBIC 3CC  Final   Culture NO GROWTH 5 DAYS  Final   Report Status 11/07/2015 FINAL  Final  Urine culture     Status: None   Collection Time: 11/02/15 10:58 PM  Result Value Ref Range Status   Specimen Description URINE, CLEAN CATCH  Final   Special Requests NONE  Final   Culture MULTIPLE SPECIES PRESENT, SUGGEST  RECOLLECTION  Final   Report Status 11/04/2015 FINAL  Final  C difficile quick scan w PCR reflex     Status: Abnormal   Collection Time: 11/03/15 12:47 AM  Result Value Ref Range Status   C Diff antigen POSITIVE (A) NEGATIVE Final   C Diff toxin NEGATIVE NEGATIVE Final   C Diff interpretation   Final    C. difficile present, but toxin not detected. This indicates colonization. In most cases, this does not require treatment. If patient has signs and symptoms consistent with colitis, consider treatment. Requires ENTERIC precautions.  Gastrointestinal Panel by PCR , Stool     Status: None   Collection Time: 11/03/15 12:47 AM  Result Value Ref Range Status   Campylobacter species NOT DETECTED NOT DETECTED Final   Plesimonas shigelloides NOT DETECTED NOT DETECTED Final   Salmonella species NOT DETECTED NOT DETECTED Final   Yersinia enterocolitica NOT DETECTED NOT DETECTED Final   Vibrio species NOT DETECTED NOT DETECTED Final   Vibrio cholerae NOT DETECTED NOT DETECTED Final   Enteroaggregative E coli (EAEC) NOT DETECTED NOT DETECTED Final   Enteropathogenic E coli (EPEC) NOT DETECTED NOT DETECTED Final   Enterotoxigenic E coli (ETEC) NOT DETECTED NOT DETECTED Final   Shiga like toxin producing E coli (STEC) NOT DETECTED NOT DETECTED Final   E. coli O157 NOT DETECTED NOT DETECTED Final  Shigella/Enteroinvasive E coli (EIEC) NOT DETECTED NOT DETECTED Final   Cryptosporidium NOT DETECTED NOT DETECTED Final   Cyclospora cayetanensis NOT DETECTED NOT DETECTED Final   Entamoeba histolytica NOT DETECTED NOT DETECTED Final   Giardia lamblia NOT DETECTED NOT DETECTED Final   Adenovirus F40/41 NOT DETECTED NOT DETECTED Final   Astrovirus NOT DETECTED NOT DETECTED Final   Norovirus GI/GII NOT DETECTED NOT DETECTED Final   Rotavirus A NOT DETECTED NOT DETECTED Final   Sapovirus (I, II, IV, and V) NOT DETECTED NOT DETECTED Final  MRSA PCR Screening     Status: None   Collection Time: 11/04/15  9:23  AM  Result Value Ref Range Status   MRSA by PCR NEGATIVE NEGATIVE Final    Comment:        The GeneXpert MRSA Assay (FDA approved for NASAL specimens only), is one component of a comprehensive MRSA colonization surveillance program. It is not intended to diagnose MRSA infection nor to guide or monitor treatment for MRSA infections.     Medical History: Past Medical History  Diagnosis Date  . Hypertension   . Chronic hip pain   . GERD (gastroesophageal reflux disease)   . Depression   . Hypercholesterolemia   . Type II diabetes mellitus ([place])   . Pneumonia 1960's X 1  . Anemia   . Sleep apnea     "couldn't wear the mask" (04/26/2015)  . Thalassemia minor   . Arthritis     "left hip" (04/26/2015)  . Cellulitis and abscess of leg hositalized 04/25/2015    left    Assessment: 47 yom with anemia of chronic disease. Admitted with low Hg but is a Jehovah's Witness and refuses blood transfusion. Pharmacy consulted to dose Aranesp. Hg stable 6.1. Fe panel 2/16: Fe low 12, Tsat low 6, ferritin 324. On ferrous sulfate PO, and Feraheme ordered x 1 dose. [id number]  Goal of Therapy:  Improvement in Hg levels  Plan:  Aranesp 40mcg [place] q7 days per protocol for wt>100kg Monitor Hg trend  Elicia Lamp, PharmD, [facility] Clinical Pharmacist Pager [phone] 11/09/2015 1:05 PM

## 2015-11-09 NOTE — Progress Notes (Signed)
Physical Therapy Treatment Patient Details Name: [name] MRN: [id number] DOB: 1956/07/24 Today's Date: 11/29/15    History of Present Illness Patient is a 60 y.o. male with hx of DM, hyperlipidemia, iron deficiency anemia, chronic LE ulcers, degenerative hip, depression, HTN presented with low hemoglobin and sepsis secondary to lower extremity ulcers. Pt is a Jehovah's Witness so refusing blood transfusion. Is also noted to have hyperkalemia and acute kidney injury.     PT Comments    Pt performed rolling for perianal care and bed pan placement, required increased time secondary to fatigue and pain.  Will return to advance pt to transfer from bed as pt reports he needs to rest at this time.    Follow Up Recommendations  SNF     Equipment Recommendations  None recommended by PT    Recommendations for Other Services       Precautions / Restrictions Precautions Precautions: Fall Precaution Comments: remains to have B wraps around BLE ulcers.   Restrictions Weight Bearing Restrictions: No    Mobility  Bed Mobility Overal bed mobility: Needs Assistance Bed Mobility: Rolling Rolling: Max assist (Pt required assist to roll for placement of bed pan and perianal care.  Pt required cues for hand placement foot palcement to improve technqiue.  pt required multiple bouts of rolling for pan placement.  PTA instructed pt to bridge hip to adjust pan.  )   Supine to sit: +2 for physical assistance;Max assist     General bed mobility comments: Pt unable to bridge with cues and assist.  Reports he needed to sit for a while on bed pan.  PTA informed will come back later to assist pt to chair.                       Cognition Arousal/Alertness: Awake/alert Behavior During Therapy: WFL for tasks assessed/performed Overall Cognitive Status: Within Functional Limits for tasks assessed                      Exercises      General Comments        Pertinent  Vitals/Pain Pain Assessment: Faces Faces Pain Scale: Hurts whole lot Pain Location: RLE and left hip Pain Descriptors / Indicators: Grimacing;Guarding Pain Intervention(s): Monitored during session;Repositioned    Home Living                      Prior Function            PT Goals (current goals can now be found in the care plan section) Acute Rehab PT Goals Patient Stated Goal: Get back to how he was before & return to SNF Potential to Achieve Goals: Fair Progress towards PT goals: Progressing toward goals    Frequency  Min 2X/week    PT Plan Current plan remains appropriate       End of Session Equipment Utilized During Treatment: Gait belt Activity Tolerance:  (limited by need to use bed pan.  ) Patient left: in bed;with call bell/phone within reach;with bed alarm set     Time: [id number] PT Time Calculation (min) (ACUTE ONLY): 12 min  Charges:  $Therapeutic Exercise: 8-22 mins $Therapeutic Activity: 8-22 mins                    G Codes:      Cristela Blue 29-Nov-2015, 11:42 AM Governor Rooks, PTA pager [phone]

## 2015-11-09 NOTE — Progress Notes (Addendum)
PROGRESS NOTE  [name] [id number] DOB: 1955-10-13 DOA: 11/02/2015 PCP: Charlsie Merles, MD  HPI/Recap of past 64 hours: 60 year old male from a nursing facility with history of diabetes mellitus and chronic lower extremity ulcers admitted for low hemoglobin  (reportedly at 5) and sepsis secondary to lower extremity ulcers., Getting courses the patient is a Jehovah's Witness and refuses transfusions of blood. Patient noted to have hyperkalemia and acute kidney injury.  Monitored closely and hemoglobin has remained stable around 6.   Patient's biggest issue is that despite broad-spectrum antibiotics, he continued to spike fevers. Urine and chest x-ray and exam unrevealing. patient himself complains of chronic lower extremity pain from his ulcers. No new symptoms. Dopplers on admission ruled out DVT. CT scan done noted no signs of abscess. Infectious disease consulted who felt that persistent fever and leukocytosis might be questionable vasculitis? Plastic surgery consulted for biopsy   Assessment/Plan: Principal Problem:   Sepsis secondary to Cellulitis of bilateral lower extremities from chronic ulcers: Infectious disease consulted and ordered a vasculitis workup recommended continuing antibiotics for now. Complement, ANA, RF, hep b surface antigen SPEP RPR ok. CRP 30. bx pending. Legs do not look cellulitic today.   acute kidney injury: Resolved.    DM (diabetes mellitus),, uncontrolled, with peripheral vascular complications ([place]): CBGs not at goal. will increase Lantus to 21 units.    Microcytic anemia. Jehovah's Witness.  no signs of active bleeding. Hemoglobin has ranged around 7-8 likely from chronic disease. Patient is on daily aspirin plus Mobic. Have stopped these for now , Dr. [name] Added PPI. Folate B12 okay. Iron indices consistent with chronic disease plus or minus iron deficiency. Will give IV iron and Aranesp as patient does not accept blood products. Fortunately,  hemoglobin has remained stable    Hyperkalemia: resolved. Treated with Kayexalate.    diarrhea resolved: GI pathogen panel negative. C. difficile results consistent with colonization (toxin negative antigen positive )   Code Status: Full code  Family Communication: None available. Patient is lucid.  Disposition Plan: SNF soon, await pathology    Consultants:  Wound care    Infectious disease   plastic surgery  Procedures:  None    Antibiotics:  IV vancomycin 2/9-present  IV Zosyn 2/9-present    Subjective: Complains of right greater than left leg pain. No bleeding.  Objective: BP 136/68 mmHg  Pulse 89  Temp(Src) 99.9 F (37.7 C) (Oral)  Resp 15  Ht 6\' 4"  (1.93 m)  Wt 107.6 kg (237 lb 3.4 oz)  BMI 28.89 kg/m2  SpO2 91%  Intake/Output Summary (Last 24 hours) at 11/09/15 1223 Last data filed at 11/09/15 1020  Gross per 24 hour  Intake 2992.5 ml  Output   3350 ml  Net -357.5 ml   Filed Weights   11/02/15 2100 11/07/15 0506  Weight: 99.9 kg (220 lb 3.8 oz) 107.6 kg (237 lb 3.4 oz)    Exam:   General:  alert and oriented.  Cardiovascular: Regular rate and rhythm, S1-S2    Respiratory: Clear to auscultation bilaterally  Abdomen: Soft, nontender, nondistended, hypoactive bowel sounds  Musculoskeletal: No edema. Multiple ulcers examined with whitish exudate and heaped up borders. No surrounding erythema.  Data Reviewed: Basic Metabolic Panel:  Recent Labs Lab 11/02/15 2015 11/02/15 2307 11/03/15 0528 11/04/15 0216 11/05/15 0400 11/07/15 0730  NA 140  --  141 140 141 137  K 6.4* 6.5* 5.9* 5.5* 4.7 4.6  CL 107  --  114* 111 109 104  CO2  20*  --  19* 22 21* 19*  GLUCOSE 63*  --  169* 275* 219* 225*  BUN 56*  --  41* 24* 15 12  CREATININE 2.23*  --  1.47* 0.99 0.95 0.95  CALCIUM 8.9  --  8.2* 8.1* 8.2* 8.4*   Liver Function Tests:  Recent Labs Lab 11/02/15 2015  AST 35  ALT 42  ALKPHOS 179*  BILITOT 0.6  PROT 7.3  ALBUMIN 2.5*    No results for input(s): LIPASE, AMYLASE in the last 168 hours. No results for input(s): AMMONIA in the last 168 hours. CBC:  Recent Labs Lab 11/02/15 2015 11/03/15 0528 11/03/15 1429 11/04/15 0216 11/05/15 0400 11/07/15 0730 11/09/15 0759  WBC 20.1* 17.7*  --  15.6* 16.1* 15.4* 11.7*  NEUTROABS 17.5*  --   --   --   --   --   --   HGB 7.1* 5.9* 6.1* 6.2* 6.0* 6.1* 6.1*  HCT 23.0* 19.5* 20.5* 20.4* 20.1* 19.5* 20.6*  MCV 61.5* 60.4*  --  61.6* 60.0* 59.5* 59.2*  PLT 309 268  --  259 268 318 373   Cardiac Enzymes:    Recent Labs Lab 11/02/15 2015 11/07/15 1631  CKTOTAL  --  80  TROPONINI 0.04*  --    BNP (last 3 results) No results for input(s): BNP in the last 8760 hours.  ProBNP (last 3 results) No results for input(s): PROBNP in the last 8760 hours.  CBG:  Recent Labs Lab 11/08/15 1229 11/08/15 1701 11/08/15 2102 11/09/15 0745 11/09/15 1212  GLUCAP 266* 201* 220* 230* 231*    Recent Results (from the past 240 hour(s))  Blood Culture (routine x 2)     Status: None   Collection Time: 11/02/15  8:15 PM  Result Value Ref Range Status   Specimen Description BLOOD RIGHT ANTECUBITAL  Final   Special Requests BOTTLES DRAWN AEROBIC AND ANAEROBIC 5CC  Final   Culture NO GROWTH 5 DAYS  Final   Report Status 11/07/2015 FINAL  Final  Blood Culture (routine x 2)     Status: None   Collection Time: 11/02/15  8:35 PM  Result Value Ref Range Status   Specimen Description BLOOD LEFT ANTECUBITAL  Final   Special Requests BOTTLES DRAWN AEROBIC AND ANAEROBIC 3CC  Final   Culture NO GROWTH 5 DAYS  Final   Report Status 11/07/2015 FINAL  Final  Urine culture     Status: None   Collection Time: 11/02/15 10:58 PM  Result Value Ref Range Status   Specimen Description URINE, CLEAN CATCH  Final   Special Requests NONE  Final   Culture MULTIPLE SPECIES PRESENT, SUGGEST RECOLLECTION  Final   Report Status 11/04/2015 FINAL  Final  C difficile quick scan w PCR reflex      Status: Abnormal   Collection Time: 11/03/15 12:47 AM  Result Value Ref Range Status   C Diff antigen POSITIVE (A) NEGATIVE Final   C Diff toxin NEGATIVE NEGATIVE Final   C Diff interpretation   Final    C. difficile present, but toxin not detected. This indicates colonization. In most cases, this does not require treatment. If patient has signs and symptoms consistent with colitis, consider treatment. Requires ENTERIC precautions.  Gastrointestinal Panel by PCR , Stool     Status: None   Collection Time: 11/03/15 12:47 AM  Result Value Ref Range Status   Campylobacter species NOT DETECTED NOT DETECTED Final   Plesimonas shigelloides NOT DETECTED NOT DETECTED Final  Salmonella species NOT DETECTED NOT DETECTED Final   Yersinia enterocolitica NOT DETECTED NOT DETECTED Final   Vibrio species NOT DETECTED NOT DETECTED Final   Vibrio cholerae NOT DETECTED NOT DETECTED Final   Enteroaggregative E coli (EAEC) NOT DETECTED NOT DETECTED Final   Enteropathogenic E coli (EPEC) NOT DETECTED NOT DETECTED Final   Enterotoxigenic E coli (ETEC) NOT DETECTED NOT DETECTED Final   Shiga like toxin producing E coli (STEC) NOT DETECTED NOT DETECTED Final   E. coli O157 NOT DETECTED NOT DETECTED Final   Shigella/Enteroinvasive E coli (EIEC) NOT DETECTED NOT DETECTED Final   Cryptosporidium NOT DETECTED NOT DETECTED Final   Cyclospora cayetanensis NOT DETECTED NOT DETECTED Final   Entamoeba histolytica NOT DETECTED NOT DETECTED Final   Giardia lamblia NOT DETECTED NOT DETECTED Final   Adenovirus F40/41 NOT DETECTED NOT DETECTED Final   Astrovirus NOT DETECTED NOT DETECTED Final   Norovirus GI/GII NOT DETECTED NOT DETECTED Final   Rotavirus A NOT DETECTED NOT DETECTED Final   Sapovirus (I, II, IV, and V) NOT DETECTED NOT DETECTED Final  MRSA PCR Screening     Status: None   Collection Time: 11/04/15  9:23 AM  Result Value Ref Range Status   MRSA by PCR NEGATIVE NEGATIVE Final    Comment:        The  GeneXpert MRSA Assay (FDA approved for NASAL specimens only), is one component of a comprehensive MRSA colonization surveillance program. It is not intended to diagnose MRSA infection nor to guide or monitor treatment for MRSA infections.      Studies: No results found.  Scheduled Meds: . sodium chloride   Intravenous Once  . atorvastatin  80 mg Oral Daily  . feeding supplement (ENSURE ENLIVE)  237 mL Oral BID  . feeding supplement (PRO-STAT SUGAR FREE 64)  30 mL Oral TID WC  . ferrous sulfate  325 mg Oral TID WC  . insulin aspart  0-20 Units Subcutaneous TID WC  . insulin aspart  0-5 Units Subcutaneous QHS  . insulin glargine  18 Units Subcutaneous QHS  . lidocaine-EPINEPHrine  10 mL Intradermal Once  . mirtazapine  15 mg Oral QHS  . multivitamin with minerals  1 tablet Oral Daily  . oxybutynin  10 mg Oral QHS  . oxyCODONE  20 mg Oral Q12H  . pantoprazole  40 mg Oral BID  . piperacillin-tazobactam (ZOSYN)  IV  3.375 g Intravenous 3 times per day  . pregabalin  75 mg Oral BID  . sodium chloride flush  3 mL Intravenous Q12H  . tamsulosin  0.4 mg Oral QPC supper  . vancomycin  1,000 mg Intravenous Q12H    Continuous Infusions: . sodium chloride 75 mL/hr at 11/09/15 1004     Time spent: 25 minutes   [facility] [email or web address], password [email or web address] 11/09/2015, 12:23 PM  LOS: 7 days

## 2015-11-09 NOTE — Progress Notes (Signed)
Inpatient Diabetes Program Recommendations  AACE/ADA: New Consensus Statement on Inpatient Glycemic Control (2015)  Target Ranges:  Prepandial:   less than 140 mg/dL      Peak postprandial:   less than 180 mg/dL (1-2 hours)      Critically ill patients:  140 - 180 mg/dL   Results for [name], [name] (MRN [id number]) as of 11/09/2015 09:25  Ref. Range 11/08/2015 07:53 11/08/2015 12:29 11/08/2015 17:01 11/08/2015 21:02 11/09/2015 07:45  Glucose-Capillary Latest Ref Range: 65-99 mg/dL 277 (H) 266 (H) 201 (H) 220 (H) 230 (H)   Review of Glycemic Control  Diabetes history: DM 2 Outpatient Diabetes medications: 70/30 25 units BID Current orders for Inpatient glycemic control: Lantus 18 units, Novolog Resistant + HS scale  Inpatient Diabetes Program Recommendations: Insulin - Basal: Patient received 18 units of basal insulin last night, glucose lowered to 230 mg/dl this am. Please consider increasing Lantus to 22 units.  Thanks,  Tama Headings RN, MSN, [facility] Inpatient Diabetes Coordinator Team Pager [phone] (8a-5p)

## 2015-11-10 DIAGNOSIS — I83209 Varicose veins of unspecified lower extremity with both ulcer of unspecified site and inflammation: Secondary | ICD-10-CM

## 2015-11-10 LAB — BASIC METABOLIC PANEL
Anion gap: 8 (ref 5–15)
BUN: 13 mg/dL (ref 6–20)
CALCIUM: 8.5 mg/dL — AB (ref 8.9–10.3)
CO2: 27 mmol/L (ref 22–32)
CREATININE: 0.95 mg/dL (ref 0.61–1.24)
Chloride: 103 mmol/L (ref 101–111)
Glucose, Bld: 212 mg/dL — ABNORMAL HIGH (ref 65–99)
Potassium: 4 mmol/L (ref 3.5–5.1)
SODIUM: 138 mmol/L (ref 135–145)

## 2015-11-10 LAB — GLUCOSE, CAPILLARY
GLUCOSE-CAPILLARY: 254 mg/dL — AB (ref 65–99)
GLUCOSE-CAPILLARY: 267 mg/dL — AB (ref 65–99)
Glucose-Capillary: 246 mg/dL — ABNORMAL HIGH (ref 65–99)
Glucose-Capillary: 260 mg/dL — ABNORMAL HIGH (ref 65–99)

## 2015-11-10 NOTE — Care Management Important Message (Signed)
Important Message  Patient Details  Name: Adir Gunnett MRN: [id number] Date of Birth: 11-02-1955   Medicare Important Message Given:  Yes    Carles Collet, RN 11/10/2015, 12:58 [place] Message  Patient Details  Name: Gurjit Signor MRN: [id number] Date of Birth: [birth year]   Medicare Important Message Given:  Yes    Carles Collet, RN 11/10/2015, 12:58 PM

## 2015-11-10 NOTE — Progress Notes (Addendum)
PROGRESS NOTE  [name] [id number] DOB: 1956/03/10 DOA: 11/02/2015 PCP: Charlsie Merles, MD  HPI/Recap of past 18 hours: 60 year old male from a nursing facility with history of diabetes mellitus and chronic lower extremity ulcers admitted for low hemoglobin  (reportedly at 5) and sepsis secondary to lower extremity ulcers., Getting courses the patient is a Jehovah's Witness and refuses transfusions of blood. Patient noted to have hyperkalemia and acute kidney injury.  Monitored closely and hemoglobin has remained stable around 6.   espite broad-spectrum antibiotics, he continued to spike fevers. Urine and chest x-ray and exam unrevealing.Dopplers on admission ruled out DVT. CT scan done noted no signs of abscess. Infectious disease consulted who felt that persistent fever and leukocytosis might be vasculitis. Plastics consulted for punch biopsy.   Assessment/Plan: Principal Problem:   Sepsis secondary to Cellulitis of bilateral lower extremities from chronic ulcers: Infectious disease consulted and ordered a vasculitis workup recommended continuing antibiotics for now. Complement, ANA, RF, hep b surface antigen SPEP RPR ok. CRP 30. Path shows stasis ulcers, no vasculitis. Wounds no longer looking infected. Day 9 abx. Discussed with Dr. Johnnye Sima. Will D/C abx and monitor for fever recurrence/stability 24 - 48 hours. Back to SNF 24-48 hours if stable  Severe nonhealing venous stasis ulcers. Continue hydrofera blue once back at SNF. We do not stock it here. aquacel Ag+ daily while here.   acute kidney injury: Resolved.    DM (diabetes mellitus),, uncontrolled, with peripheral vascular complications ([place]): CBGs not at goal. will increase Lantus to 23 units. Continue SSI    Microcytic anemia. Jehovah's Witness.  no signs of active bleeding. Hemoglobin has ranged around 7-8 likely from chronic disease. Patient is on daily aspirin plus Mobic. Have stopped these for now , Dr. [name] Added  PPI. Folate B12 okay. Iron indices consistent with chronic disease plus or minus iron deficiency. Will give IV iron and Aranesp as patient does not accept blood products. Fortunately, hemoglobin has remained stable    Hyperkalemia: resolved. Treated with Kayexalate.    diarrhea resolved: GI pathogen panel negative. C. difficile results consistent with colonization (toxin negative antigen positive )   Code Status: Full code  Family Communication: None available. Patient is lucid.  Disposition Plan: SNF 1-2 days if stable   Consultants:  Wound care    Infectious disease   plastic surgery  Procedures:  None    Antibiotics:  IV vancomycin 2/9-present  IV Zosyn 2/9-present    Subjective: Complains of right greater than left leg pain. No bleeding.  Objective: BP 129/74 mmHg  Pulse 86  Temp(Src) 99.8 F (37.7 C) (Oral)  Resp 16  Ht 6\' 4"  (1.93 m)  Wt 107.6 kg (237 lb 3.4 oz)  BMI 28.89 kg/m2  SpO2 94%  Intake/Output Summary (Last 24 hours) at 11/10/15 1115 Last data filed at 11/10/15 0352  Gross per 24 hour  Intake    350 ml  Output   1625 ml  Net  -1275 ml   Filed Weights   11/02/15 2100 11/07/15 0506  Weight: 99.9 kg (220 lb 3.8 oz) 107.6 kg (237 lb 3.4 oz)    Exam:   General:  alert and oriented.  Cardiovascular: Regular rate and rhythm, S1-S2    Respiratory: Clear to auscultation bilaterally  Abdomen: Soft, nontender, nondistended, hypoactive bowel sounds  Musculoskeletal: No edema. Multiple ulcers examined 2/16 with whitish exudate and heaped up borders. No surrounding erythema.  Data Reviewed: Basic Metabolic Panel:  Recent Labs Lab 11/04/15 0216 11/05/15  0400 11/07/15 0730 11/10/15 0607  NA 140 141 137 138  K 5.5* 4.7 4.6 4.0  CL 111 109 104 103  CO2 22 21* 19* 27  GLUCOSE 275* 219* 225* 212*  BUN 24* 15 12 13   CREATININE 0.99 0.95 0.95 0.95  CALCIUM 8.1* 8.2* 8.4* 8.5*   Liver Function Tests: No results for input(s): AST, ALT,  ALKPHOS, BILITOT, PROT, ALBUMIN in the last 168 hours. No results for input(s): LIPASE, AMYLASE in the last 168 hours. No results for input(s): AMMONIA in the last 168 hours. CBC:  Recent Labs Lab 11/03/15 1429 11/04/15 0216 11/05/15 0400 11/07/15 0730 11/09/15 0759  WBC  --  15.6* 16.1* 15.4* 11.7*  HGB 6.1* 6.2* 6.0* 6.1* 6.1*  HCT 20.5* 20.4* 20.1* 19.5* 20.6*  MCV  --  61.6* 60.0* 59.5* 59.2*  PLT  --  259 268 318 373   Cardiac Enzymes:    Recent Labs Lab 11/07/15 1631  CKTOTAL 80   BNP (last 3 results) No results for input(s): BNP in the last 8760 hours.  ProBNP (last 3 results) No results for input(s): PROBNP in the last 8760 hours.  CBG:  Recent Labs Lab 11/09/15 0745 11/09/15 1212 11/09/15 1732 11/09/15 2217 11/10/15 0759  GLUCAP 230* 231* 267* 203* 254*    Recent Results (from the past 240 hour(s))  Blood Culture (routine x 2)     Status: None   Collection Time: 11/02/15  8:15 PM  Result Value Ref Range Status   Specimen Description BLOOD RIGHT ANTECUBITAL  Final   Special Requests BOTTLES DRAWN AEROBIC AND ANAEROBIC 5CC  Final   Culture NO GROWTH 5 DAYS  Final   Report Status 11/07/2015 FINAL  Final  Blood Culture (routine x 2)     Status: None   Collection Time: 11/02/15  8:35 PM  Result Value Ref Range Status   Specimen Description BLOOD LEFT ANTECUBITAL  Final   Special Requests BOTTLES DRAWN AEROBIC AND ANAEROBIC 3CC  Final   Culture NO GROWTH 5 DAYS  Final   Report Status 11/07/2015 FINAL  Final  Urine culture     Status: None   Collection Time: 11/02/15 10:58 PM  Result Value Ref Range Status   Specimen Description URINE, CLEAN CATCH  Final   Special Requests NONE  Final   Culture MULTIPLE SPECIES PRESENT, SUGGEST RECOLLECTION  Final   Report Status 11/04/2015 FINAL  Final  C difficile quick scan w PCR reflex     Status: Abnormal   Collection Time: 11/03/15 12:47 AM  Result Value Ref Range Status   C Diff antigen POSITIVE (A)  NEGATIVE Final   C Diff toxin NEGATIVE NEGATIVE Final   C Diff interpretation   Final    C. difficile present, but toxin not detected. This indicates colonization. In most cases, this does not require treatment. If patient has signs and symptoms consistent with colitis, consider treatment. Requires ENTERIC precautions.  Gastrointestinal Panel by PCR , Stool     Status: None   Collection Time: 11/03/15 12:47 AM  Result Value Ref Range Status   Campylobacter species NOT DETECTED NOT DETECTED Final   Plesimonas shigelloides NOT DETECTED NOT DETECTED Final   Salmonella species NOT DETECTED NOT DETECTED Final   Yersinia enterocolitica NOT DETECTED NOT DETECTED Final   Vibrio species NOT DETECTED NOT DETECTED Final   Vibrio cholerae NOT DETECTED NOT DETECTED Final   Enteroaggregative E coli (EAEC) NOT DETECTED NOT DETECTED Final   Enteropathogenic E coli (EPEC) NOT  DETECTED NOT DETECTED Final   Enterotoxigenic E coli (ETEC) NOT DETECTED NOT DETECTED Final   Shiga like toxin producing E coli (STEC) NOT DETECTED NOT DETECTED Final   E. coli O157 NOT DETECTED NOT DETECTED Final   Shigella/Enteroinvasive E coli (EIEC) NOT DETECTED NOT DETECTED Final   Cryptosporidium NOT DETECTED NOT DETECTED Final   Cyclospora cayetanensis NOT DETECTED NOT DETECTED Final   Entamoeba histolytica NOT DETECTED NOT DETECTED Final   Giardia lamblia NOT DETECTED NOT DETECTED Final   Adenovirus F40/41 NOT DETECTED NOT DETECTED Final   Astrovirus NOT DETECTED NOT DETECTED Final   Norovirus GI/GII NOT DETECTED NOT DETECTED Final   Rotavirus A NOT DETECTED NOT DETECTED Final   Sapovirus (I, II, IV, and V) NOT DETECTED NOT DETECTED Final  MRSA PCR Screening     Status: None   Collection Time: 11/04/15  9:23 AM  Result Value Ref Range Status   MRSA by PCR NEGATIVE NEGATIVE Final    Comment:        The GeneXpert MRSA Assay (FDA approved for NASAL specimens only), is one component of a comprehensive MRSA  colonization surveillance program. It is not intended to diagnose MRSA infection nor to guide or monitor treatment for MRSA infections.      Studies: No results found.  Scheduled Meds: . sodium chloride   Intravenous Once  . atorvastatin  80 mg Oral Daily  . darbepoetin (ARANESP) injection - NON-DIALYSIS  60 mcg Subcutaneous Q Thu-1800  . feeding supplement (ENSURE ENLIVE)  237 mL Oral BID  . feeding supplement (PRO-STAT SUGAR FREE 64)  30 mL Oral TID WC  . ferrous sulfate  325 mg Oral TID WC  . insulin aspart  0-20 Units Subcutaneous TID WC  . insulin aspart  0-5 Units Subcutaneous QHS  . insulin glargine  21 Units Subcutaneous QHS  . lidocaine-EPINEPHrine  10 mL Intradermal Once  . mirtazapine  15 mg Oral QHS  . multivitamin with minerals  1 tablet Oral Daily  . oxybutynin  10 mg Oral QHS  . oxyCODONE  20 mg Oral Q12H  . pantoprazole  40 mg Oral BID  . piperacillin-tazobactam (ZOSYN)  IV  3.375 g Intravenous 3 times per day  . pregabalin  75 mg Oral BID  . sodium chloride flush  3 mL Intravenous Q12H  . tamsulosin  0.4 mg Oral QPC supper  . vancomycin  1,000 mg Intravenous Q12H    Continuous Infusions: . sodium chloride 75 mL/hr at 11/09/15 1004     Time spent: 25 minutes   [facility] [email or web address], password [facility] 11/10/2015, 11:15 AM  LOS: 8 days

## 2015-11-10 NOTE — Progress Notes (Signed)
INFECTIOUS DISEASE PROGRESS NOTE  ID: [name] is a 60 y.o. male with  Principal Problem:   Cellulitis of leg Active Problems:   Sepsis ([place])   DM (diabetes mellitus), secondary, uncontrolled, with peripheral vascular complications (HCC)   Microcytic anemia   Leg ulcer (HCC)   AKI (acute kidney injury) ([place])   Hyperkalemia   Hypoglycemia  Subjective: Low grade temps. continued pain  Abtx:  Anti-infectives    Start     Dose/Rate Route Frequency Ordered Stop   11/09/15 1600  vancomycin (VANCOCIN) IVPB 750 mg/150 ml premix  Status:  Discontinued     750 mg 150 mL/hr over 60 Minutes Intravenous Every 8 hours 11/09/15 1101 11/09/15 1133   11/09/15 1500  vancomycin (VANCOCIN) IVPB 1000 mg/200 mL premix     1,000 mg 200 mL/hr over 60 Minutes Intravenous Every 12 hours 11/09/15 1133     11/08/15 1830  vancomycin (VANCOCIN) IVPB 1000 mg/200 mL premix  Status:  Discontinued     1,000 mg 200 mL/hr over 60 Minutes Intravenous Every 8 hours 11/08/15 1154 11/09/15 1101   11/05/15 2015  vancomycin (VANCOCIN) IVPB 1000 mg/200 mL premix  Status:  Discontinued     1,000 mg 200 mL/hr over 60 Minutes Intravenous Every 8 hours 11/05/15 2005 11/08/15 1154   11/03/15 2000  vancomycin (VANCOCIN) IVPB 1000 mg/200 mL premix  Status:  Discontinued     1,000 mg 200 mL/hr over 60 Minutes Intravenous Every 24 hours 11/03/15 0024 11/05/15 2005   11/03/15 0600  piperacillin-tazobactam (ZOSYN) IVPB 3.375 g     3.375 g 12.5 mL/hr over 240 Minutes Intravenous 3 times per day 11/03/15 0024     11/02/15 2045  vancomycin (VANCOCIN) 1,500 mg in sodium chloride 0.9 % 500 mL IVPB     1,500 mg 250 mL/hr over 120 Minutes Intravenous  Once 11/02/15 2040 11/02/15 2257   11/02/15 2030  piperacillin-tazobactam (ZOSYN) IVPB 3.375 g     3.375 g 100 mL/hr over 30 Minutes Intravenous  Once 11/02/15 2024 11/02/15 2318   11/02/15 2030  vancomycin (VANCOCIN) IVPB 1000 mg/200 mL premix  Status:  Discontinued     1,000  mg 200 mL/hr over 60 Minutes Intravenous  Once 11/02/15 2024 11/02/15 2040      Medications:  Scheduled: . sodium chloride   Intravenous Once  . atorvastatin  80 mg Oral Daily  . darbepoetin (ARANESP) injection - NON-DIALYSIS  60 mcg Subcutaneous Q Thu-1800  . feeding supplement (ENSURE ENLIVE)  237 mL Oral BID  . feeding supplement (PRO-STAT SUGAR FREE 64)  30 mL Oral TID WC  . ferrous sulfate  325 mg Oral TID WC  . insulin aspart  0-20 Units Subcutaneous TID WC  . insulin aspart  0-5 Units Subcutaneous QHS  . insulin glargine  21 Units Subcutaneous QHS  . lidocaine-EPINEPHrine  10 mL Intradermal Once  . mirtazapine  15 mg Oral QHS  . multivitamin with minerals  1 tablet Oral Daily  . oxybutynin  10 mg Oral QHS  . oxyCODONE  20 mg Oral Q12H  . pantoprazole  40 mg Oral BID  . pregabalin  75 mg Oral BID  . sodium chloride flush  3 mL Intravenous Q12H  . tamsulosin  0.4 mg Oral QPC supper    Objective: Vital signs in last 24 hours: Temp:  [98.9 F (37.2 C)-100.2 F (37.9 C)] 99.8 F (37.7 C) (02/17 0607) Pulse Rate:  [79-90] 86 (02/17 0607) Resp:  [16-18] 16 (02/17 [id number])  BP: (129-141)/(72-74) 129/74 mmHg (02/17 0607) SpO2:  [94 %-97 %] 94 % (02/17 0607)   General appearance: alert, cooperative and no distress Extremities: wounds dressed. tender proximally  Lab Results  Recent Labs  11/09/15 0759 11/10/15 0607  WBC 11.7*  --   HGB 6.1*  --   HCT 20.6*  --   NA  --  138  K  --  4.0  CL  --  103  CO2  --  27  BUN  --  13  CREATININE  --  0.95   Liver Panel No results for input(s): PROT, ALBUMIN, AST, ALT, ALKPHOS, BILITOT, BILIDIR, IBILI in the last 72 hours. Sedimentation Rate No results for input(s): ESRSEDRATE in the last 72 hours. C-Reactive Protein  Recent Labs  11/07/15 1631  CRP 29.9*    Microbiology: Recent Results (from the past 240 hour(s))  Blood Culture (routine x 2)     Status: None   Collection Time: 11/02/15  8:15 PM  Result Value  Ref Range Status   Specimen Description BLOOD RIGHT ANTECUBITAL  Final   Special Requests BOTTLES DRAWN AEROBIC AND ANAEROBIC 5CC  Final   Culture NO GROWTH 5 DAYS  Final   Report Status 11/07/2015 FINAL  Final  Blood Culture (routine x 2)     Status: None   Collection Time: 11/02/15  8:35 PM  Result Value Ref Range Status   Specimen Description BLOOD LEFT ANTECUBITAL  Final   Special Requests BOTTLES DRAWN AEROBIC AND ANAEROBIC 3CC  Final   Culture NO GROWTH 5 DAYS  Final   Report Status 11/07/2015 FINAL  Final  Urine culture     Status: None   Collection Time: 11/02/15 10:58 PM  Result Value Ref Range Status   Specimen Description URINE, CLEAN CATCH  Final   Special Requests NONE  Final   Culture MULTIPLE SPECIES PRESENT, SUGGEST RECOLLECTION  Final   Report Status 11/04/2015 FINAL  Final  C difficile quick scan w PCR reflex     Status: Abnormal   Collection Time: 11/03/15 12:47 AM  Result Value Ref Range Status   C Diff antigen POSITIVE (A) NEGATIVE Final   C Diff toxin NEGATIVE NEGATIVE Final   C Diff interpretation   Final    C. difficile present, but toxin not detected. This indicates colonization. In most cases, this does not require treatment. If patient has signs and symptoms consistent with colitis, consider treatment. Requires ENTERIC precautions.  Gastrointestinal Panel by PCR , Stool     Status: None   Collection Time: 11/03/15 12:47 AM  Result Value Ref Range Status   Campylobacter species NOT DETECTED NOT DETECTED Final   Plesimonas shigelloides NOT DETECTED NOT DETECTED Final   Salmonella species NOT DETECTED NOT DETECTED Final   Yersinia enterocolitica NOT DETECTED NOT DETECTED Final   Vibrio species NOT DETECTED NOT DETECTED Final   Vibrio cholerae NOT DETECTED NOT DETECTED Final   Enteroaggregative E coli (EAEC) NOT DETECTED NOT DETECTED Final   Enteropathogenic E coli (EPEC) NOT DETECTED NOT DETECTED Final   Enterotoxigenic E coli (ETEC) NOT DETECTED NOT  DETECTED Final   Shiga like toxin producing E coli (STEC) NOT DETECTED NOT DETECTED Final   E. coli O157 NOT DETECTED NOT DETECTED Final   Shigella/Enteroinvasive E coli (EIEC) NOT DETECTED NOT DETECTED Final   Cryptosporidium NOT DETECTED NOT DETECTED Final   Cyclospora cayetanensis NOT DETECTED NOT DETECTED Final   Entamoeba histolytica NOT DETECTED NOT DETECTED Final   Giardia lamblia  NOT DETECTED NOT DETECTED Final   Adenovirus F40/41 NOT DETECTED NOT DETECTED Final   Astrovirus NOT DETECTED NOT DETECTED Final   Norovirus GI/GII NOT DETECTED NOT DETECTED Final   Rotavirus A NOT DETECTED NOT DETECTED Final   Sapovirus (I, II, IV, and V) NOT DETECTED NOT DETECTED Final  MRSA PCR Screening     Status: None   Collection Time: 11/04/15  9:23 AM  Result Value Ref Range Status   MRSA by PCR NEGATIVE NEGATIVE Final    Comment:        The GeneXpert MRSA Assay (FDA approved for NASAL specimens only), is one component of a comprehensive MRSA colonization surveillance program. It is not intended to diagnose MRSA infection nor to guide or monitor treatment for MRSA infections.     Studies/Results: No results found.   Assessment/Plan: Sepsis Recurrent leg ulcers  Bx of ulcers- stasis dermatitis Vasculitis w/u is negative Agree with stopping anbx Consider unaboot, other long term dressings  c diff colonization  Dm2  uncontrolled  Appreciate Dr Jenne Campus outstanding care of pt  Total days of antibiotics: 8 vanco/zosyn         [name] Infectious Diseases (pager) [phone] [email or web address] 11/10/2015, 12:00 PM  LOS: 8 days

## 2015-11-10 NOTE — Progress Notes (Signed)
Inpatient Diabetes Program Recommendations  AACE/ADA: New Consensus Statement on Inpatient Glycemic Control (2015)  Target Ranges:  Prepandial:   less than 140 mg/dL      Peak postprandial:   less than 180 mg/dL (1-2 hours)      Critically ill patients:  140 - 180 mg/dL   Review of Glycemic Control  Results for [name], [name] (MRN [id number]) as of 11/10/2015 09:59  Ref. Range 11/09/2015 07:45 11/09/2015 12:12 11/09/2015 17:32 11/09/2015 22:17 11/10/2015 07:59  Glucose-Capillary Latest Ref Range: 65-99 mg/dL 230 (H) 231 (H) 267 (H) 203 (H) 254 (H)   Post-prandial blood sugars > 200 mg/dL. FBS slightly improved, continue to titrate Lantus.  Inpatient Diabetes Program Recommendations:  Add Novolog 5 units tidwc for meal coverage insulin if pt eats > 50% meal Titrate Lantus to 23 units QHS.  Will continue to follow. Thank you. Lorenda Peck, RD, LDN, CDE Inpatient Diabetes Coordinator [phone]

## 2015-11-11 LAB — GLUCOSE, CAPILLARY
GLUCOSE-CAPILLARY: 177 mg/dL — AB (ref 65–99)
GLUCOSE-CAPILLARY: 309 mg/dL — AB (ref 65–99)
GLUCOSE-CAPILLARY: 257 mg/dL — AB (ref 65–99)

## 2015-11-11 NOTE — Progress Notes (Signed)
PROGRESS NOTE  [name] [id number] DOB: [birth year] DOA: 11/02/2015 PCP: Charlsie Merles, MD  HPI/Recap of past 78 hours: 60 year old male from a nursing facility with history of diabetes mellitus and chronic lower extremity ulcers admitted for low hemoglobin  (reportedly at 5) and sepsis secondary to lower extremity ulcers., Getting courses the patient is a Jehovah's Witness and refuses transfusions of blood. Patient noted to have hyperkalemia and acute kidney injury.  Monitored closely and hemoglobin has remained stable around 6.   espite broad-spectrum antibiotics, he continued to spike fevers. Urine and chest x-ray and exam unrevealing.Dopplers on admission ruled out DVT. CT scan done noted no signs of abscess. Infectious disease consulted who felt that persistent fever and leukocytosis might be vasculitis. Plastics consulted for punch biopsy.   Assessment/Plan: Principal Problem:   Sepsis secondary to Cellulitis of bilateral lower extremities from chronic ulcers: Infectious disease consulted and ordered a vasculitis workup recommended continuing antibiotics for now. Complement, ANA, RF, hep b surface antigen SPEP RPR ok. CRP 30. Path shows stasis ulcers, no vasculitis. Wounds no longer looking infected.  abxx9 days. abx d/ced on 2/17 after Discussion  with infectious disease Dr. Johnnye Sima. monitor for fever recurrence/stability 24 - 48 hours off abx. D/c ivf, start incentive spirometer, Back to SNF 24-48 hours if stable  Severe nonhealing venous stasis ulcers. Acute on chronic for years, baseline wheelchair/walker for years. From SNF. Continue hydrofera blue once back at SNF. We do not stock it here. aquacel Ag+ daily while here. Wound care input appreciated.   acute kidney injury: Resolved.    DM (diabetes mellitus),, uncontrolled, with peripheral vascular complications ([place]): CBGs not at goal. will increase Lantus to 23 units. Continue SSI, a1c pending    Microcytic anemia.  Jehovah's Witness.  no signs of active bleeding. Hemoglobin has ranged around 7-8 likely from chronic disease. Patient is on daily aspirin plus Mobic. Have stopped these for now , Dr. [name] Added PPI. Folate B12 okay. Iron indices consistent with chronic disease plus or minus iron deficiency. S/p  IV iron and Aranesp as patient does not accept blood products. Now on oral iron supplement, stool occult blood negative. spep negative for m spike. 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    Hyperkalemia: resolved. Treated with Kayexalate.    diarrhea resolved: GI pathogen panel negative. C. difficile results consistent with colonization (toxin negative antigen positive )   Code Status: Full code  Family Communication: None available. Patient is lucid.  Disposition Plan: SNF 1-2 days if stable   Consultants:  Wound care    Infectious disease   plastic surgery  Procedures:  None    Antibiotics:  IV vancomycin 2/9-2/17  IV Zosyn 2/9-2/17  Subjective: Complains of right greater than left leg pain. No bleeding. Aaox3, not in distress. Reported no bm for the last 24hrs, denies ab pain, no n/v.  Objective: BP 133/77 mmHg  Pulse 85  Temp(Src) 100.1 F (37.8 C) (Oral)  Resp 16  Ht 6\' 4"  (1.93 m)  Wt 107.6 kg (237 lb 3.4 oz)  BMI 28.89 kg/m2  SpO2 92%  Intake/Output Summary (Last 24 hours) at 11/11/15 1118 Last data filed at 11/11/15 1038  Gross per 24 hour  Intake    220 ml  Output   2275 ml  Net  -2055 ml   Filed Weights   11/02/15 2100 11/07/15 0506  Weight: 99.9 kg (220 lb 3.8 oz) 107.6 kg (237 lb 3.4 oz)  Exam:   General:  alert and oriented.  Cardiovascular: Regular rate and rhythm, S1-S2    Respiratory: Clear to auscultation bilaterally  Abdomen: Soft, nontender, nondistended, hypoactive bowel sounds  Musculoskeletal: No edema. Multiple ulcers examined 2/16 with whitish exudate and heaped up borders. No  surrounding erythema. Protection boots bilaterally  Data Reviewed: Basic Metabolic Panel:  Recent Labs Lab 11/05/15 0400 11/07/15 0730 11/10/15 0607  NA 141 137 138  K 4.7 4.6 4.0  CL 109 104 103  CO2 21* 19* 27  GLUCOSE 219* 225* 212*  BUN 15 12 13   CREATININE 0.95 0.95 0.95  CALCIUM 8.2* 8.4* 8.5*   Liver Function Tests: No results for input(s): AST, ALT, ALKPHOS, BILITOT, PROT, ALBUMIN in the last 168 hours. No results for input(s): LIPASE, AMYLASE in the last 168 hours. No results for input(s): AMMONIA in the last 168 hours. CBC:  Recent Labs Lab 11/05/15 0400 11/07/15 0730 11/09/15 0759  WBC 16.1* 15.4* 11.7*  HGB 6.0* 6.1* 6.1*  HCT 20.1* 19.5* 20.6*  MCV 60.0* 59.5* 59.2*  PLT 268 318 373   Cardiac Enzymes:    Recent Labs Lab 11/07/15 1631  CKTOTAL 80   BNP (last 3 results) No results for input(s): BNP in the last 8760 hours.  ProBNP (last 3 results) No results for input(s): PROBNP in the last 8760 hours.  CBG:  Recent Labs Lab 11/10/15 0759 11/10/15 1209 11/10/15 2006 11/10/15 2143 11/11/15 0752  GLUCAP 254* 246* 260* 267* 177*    Recent Results (from the past 240 hour(s))  Blood Culture (routine x 2)     Status: None   Collection Time: 11/02/15  8:15 PM  Result Value Ref Range Status   Specimen Description BLOOD RIGHT ANTECUBITAL  Final   Special Requests BOTTLES DRAWN AEROBIC AND ANAEROBIC 5CC  Final   Culture NO GROWTH 5 DAYS  Final   Report Status 11/07/2015 FINAL  Final  Blood Culture (routine x 2)     Status: None   Collection Time: 11/02/15  8:35 PM  Result Value Ref Range Status   Specimen Description BLOOD LEFT ANTECUBITAL  Final   Special Requests BOTTLES DRAWN AEROBIC AND ANAEROBIC 3CC  Final   Culture NO GROWTH 5 DAYS  Final   Report Status 11/07/2015 FINAL  Final  Urine culture     Status: None   Collection Time: 11/02/15 10:58 PM  Result Value Ref Range Status   Specimen Description URINE, CLEAN CATCH  Final    Special Requests NONE  Final   Culture MULTIPLE SPECIES PRESENT, SUGGEST RECOLLECTION  Final   Report Status 11/04/2015 FINAL  Final  C difficile quick scan w PCR reflex     Status: Abnormal   Collection Time: 11/03/15 12:47 AM  Result Value Ref Range Status   C Diff antigen POSITIVE (A) NEGATIVE Final   C Diff toxin NEGATIVE NEGATIVE Final   C Diff interpretation   Final    C. difficile present, but toxin not detected. This indicates colonization. In most cases, this does not require treatment. If patient has signs and symptoms consistent with colitis, consider treatment. Requires ENTERIC precautions.  Gastrointestinal Panel by PCR , Stool     Status: None   Collection Time: 11/03/15 12:47 AM  Result Value Ref Range Status   Campylobacter species NOT DETECTED NOT DETECTED Final   Plesimonas shigelloides NOT DETECTED NOT DETECTED Final   Salmonella species NOT DETECTED NOT DETECTED Final   Yersinia enterocolitica NOT DETECTED NOT DETECTED Final  Vibrio species NOT DETECTED NOT DETECTED Final   Vibrio cholerae NOT DETECTED NOT DETECTED Final   Enteroaggregative E coli (EAEC) NOT DETECTED NOT DETECTED Final   Enteropathogenic E coli (EPEC) NOT DETECTED NOT DETECTED Final   Enterotoxigenic E coli (ETEC) NOT DETECTED NOT DETECTED Final   Shiga like toxin producing E coli (STEC) NOT DETECTED NOT DETECTED Final   E. coli O157 NOT DETECTED NOT DETECTED Final   Shigella/Enteroinvasive E coli (EIEC) NOT DETECTED NOT DETECTED Final   Cryptosporidium NOT DETECTED NOT DETECTED Final   Cyclospora cayetanensis NOT DETECTED NOT DETECTED Final   Entamoeba histolytica NOT DETECTED NOT DETECTED Final   Giardia lamblia NOT DETECTED NOT DETECTED Final   Adenovirus F40/41 NOT DETECTED NOT DETECTED Final   Astrovirus NOT DETECTED NOT DETECTED Final   Norovirus GI/GII NOT DETECTED NOT DETECTED Final   Rotavirus A NOT DETECTED NOT DETECTED Final   Sapovirus (I, II, IV, and V) NOT DETECTED NOT DETECTED  Final  MRSA PCR Screening     Status: None   Collection Time: 11/04/15  9:23 AM  Result Value Ref Range Status   MRSA by PCR NEGATIVE NEGATIVE Final    Comment:        The GeneXpert MRSA Assay (FDA approved for NASAL specimens only), is one component of a comprehensive MRSA colonization surveillance program. It is not intended to diagnose MRSA infection nor to guide or monitor treatment for MRSA infections.      Studies: No results found.  Scheduled Meds: . sodium chloride   Intravenous Once  . atorvastatin  80 mg Oral Daily  . darbepoetin (ARANESP) injection - NON-DIALYSIS  60 mcg Subcutaneous Q Thu-1800  . feeding supplement (ENSURE ENLIVE)  237 mL Oral BID  . feeding supplement (PRO-STAT SUGAR FREE 64)  30 mL Oral TID WC  . ferrous sulfate  325 mg Oral TID WC  . insulin aspart  0-20 Units Subcutaneous TID WC  . insulin aspart  0-5 Units Subcutaneous QHS  . insulin glargine  21 Units Subcutaneous QHS  . lidocaine-EPINEPHrine  10 mL Intradermal Once  . mirtazapine  15 mg Oral QHS  . multivitamin with minerals  1 tablet Oral Daily  . oxybutynin  10 mg Oral QHS  . oxyCODONE  20 mg Oral Q12H  . pantoprazole  40 mg Oral BID  . pregabalin  75 mg Oral BID  . sodium chloride flush  3 mL Intravenous Q12H  . tamsulosin  0.4 mg Oral QPC supper    Continuous Infusions: . sodium chloride 75 mL/hr at 11/11/15 0559     Time spent: 25 minutes   Christian Borgerding MD PhD [facility] [phone] [email or web address], password [facility] 11/11/2015, 11:18 AM  LOS: 9 days

## 2015-11-12 DIAGNOSIS — N179 Acute kidney failure, unspecified: Secondary | ICD-10-CM

## 2015-11-12 LAB — MAGNESIUM: Magnesium: 1.9 mg/dL (ref 1.7–2.4)

## 2015-11-12 LAB — COMPREHENSIVE METABOLIC PANEL
ALBUMIN: 1.9 g/dL — AB (ref 3.5–5.0)
ALK PHOS: 316 U/L — AB (ref 38–126)
ALT: 38 U/L (ref 17–63)
AST: 28 U/L (ref 15–41)
Anion gap: 11 (ref 5–15)
BILIRUBIN TOTAL: 0.3 mg/dL (ref 0.3–1.2)
BUN: 13 mg/dL (ref 6–20)
CALCIUM: 8.8 mg/dL — AB (ref 8.9–10.3)
CO2: 26 mmol/L (ref 22–32)
CREATININE: 0.78 mg/dL (ref 0.61–1.24)
Chloride: 102 mmol/L (ref 101–111)
GFR calc Af Amer: >60 mL/min (ref >60)
GFR calc non Af Amer: >60 mL/min (ref >60)
GLUCOSE: 259 mg/dL — AB (ref 65–99)
Potassium: 4 mmol/L (ref 3.5–5.1)
SODIUM: 139 mmol/L (ref 135–145)
TOTAL PROTEIN: 7.6 g/dL (ref 6.5–8.1)

## 2015-11-12 LAB — GLUCOSE, CAPILLARY
GLUCOSE-CAPILLARY: 267 mg/dL — AB (ref 65–99)
Glucose-Capillary: 230 mg/dL — ABNORMAL HIGH (ref 65–99)
Glucose-Capillary: 208 mg/dL — ABNORMAL HIGH (ref 65–99)
Glucose-Capillary: 351 mg/dL — ABNORMAL HIGH (ref 65–99)
Glucose-Capillary: 278 mg/dL — ABNORMAL HIGH (ref 65–99)

## 2015-11-12 MED ORDER — INSULIN GLARGINE 100 UNIT/ML ~~LOC~~ SOLN
25.0000 [IU] | Freq: Every day | SUBCUTANEOUS | Status: DC
  Administered 2015-11-12: 25 [IU] via SUBCUTANEOUS
  Filled 2015-11-12 (×2): qty 0.25

## 2015-11-12 MED ORDER — HEPARIN SODIUM (PORCINE) 5000 UNIT/ML IJ SOLN
5000.0000 [IU] | Freq: Three times a day (TID) | INTRAMUSCULAR | Status: DC
  Administered 2015-11-12 – 2015-11-13 (×3): 5000 [IU] via SUBCUTANEOUS
  Filled 2015-11-12 (×3): qty 1

## 2015-11-12 NOTE — Progress Notes (Signed)
[place] NOTE  Pharmacy Consult for Aranesp Indication: Anemia of Chronic Disease  Allergies  Allergen Reactions  . Other     Pt is a Jehovah Witness. No blood products.  . Sulfa Antibiotics     Causes flu-like symptom : sweating, chills, fever, body aches    Patient Measurements: Height: 6\' 4"  (193 cm) Weight: 232 lb 5.8 oz (105.4 kg) IBW/kg (Calculated) : 86.8 Adjusted Body Weight:   Vital Signs: Temp: 98.6 F (37 C) (02/19 0525) Temp Source: Oral (02/19 0525) BP: 126/70 mmHg (02/19 0525) Pulse Rate: 83 (02/19 0525) Intake/Output from previous day: 02/18 0701 - 02/19 0700 In: [id number] [P.O.:570; I.V.:975] Out: 1700 [Urine:1700] Intake/Output from this shift:    Labs:  Recent Labs  11/10/15 0607 11/12/15 0530  CREATININE 0.95 0.78  MG  --  1.9  ALBUMIN  --  1.9*  PROT  --  7.6  AST  --  28  ALT  --  38  ALKPHOS  --  316*  BILITOT  --  0.3   Estimated Creatinine Clearance: 129.2 mL/min (by C-G formula based on Cr of 0.78).   Microbiology: Recent Results (from the past 720 hour(s))  Blood Culture (routine x 2)     Status: None   Collection Time: 11/02/15  8:15 PM  Result Value Ref Range Status   Specimen Description BLOOD RIGHT ANTECUBITAL  Final   Special Requests BOTTLES DRAWN AEROBIC AND ANAEROBIC 5CC  Final   Culture NO GROWTH 5 DAYS  Final   Report Status 11/07/2015 FINAL  Final  Blood Culture (routine x 2)     Status: None   Collection Time: 11/02/15  8:35 PM  Result Value Ref Range Status   Specimen Description BLOOD LEFT ANTECUBITAL  Final   Special Requests BOTTLES DRAWN AEROBIC AND ANAEROBIC 3CC  Final   Culture NO GROWTH 5 DAYS  Final   Report Status 11/07/2015 FINAL  Final  Urine culture     Status: None   Collection Time: 11/02/15 10:58 PM  Result Value Ref Range Status   Specimen Description URINE, CLEAN CATCH  Final   Special Requests NONE  Final   Culture MULTIPLE SPECIES PRESENT, SUGGEST RECOLLECTION  Final   Report  Status 11/04/2015 FINAL  Final  C difficile quick scan w PCR reflex     Status: Abnormal   Collection Time: 11/03/15 12:47 AM  Result Value Ref Range Status   C Diff antigen POSITIVE (A) NEGATIVE Final   C Diff toxin NEGATIVE NEGATIVE Final   C Diff interpretation   Final    C. difficile present, but toxin not detected. This indicates colonization. In most cases, this does not require treatment. If patient has signs and symptoms consistent with colitis, consider treatment. Requires ENTERIC precautions.  Gastrointestinal Panel by PCR , Stool     Status: None   Collection Time: 11/03/15 12:47 AM  Result Value Ref Range Status   Campylobacter species NOT DETECTED NOT DETECTED Final   Plesimonas shigelloides NOT DETECTED NOT DETECTED Final   Salmonella species NOT DETECTED NOT DETECTED Final   Yersinia enterocolitica NOT DETECTED NOT DETECTED Final   Vibrio species NOT DETECTED NOT DETECTED Final   Vibrio cholerae NOT DETECTED NOT DETECTED Final   Enteroaggregative E coli (EAEC) NOT DETECTED NOT DETECTED Final   Enteropathogenic E coli (EPEC) NOT DETECTED NOT DETECTED Final   Enterotoxigenic E coli (ETEC) NOT DETECTED NOT DETECTED Final   Shiga like toxin producing E coli (STEC) NOT DETECTED  NOT DETECTED Final   E. coli O157 NOT DETECTED NOT DETECTED Final   Shigella/Enteroinvasive E coli (EIEC) NOT DETECTED NOT DETECTED Final   Cryptosporidium NOT DETECTED NOT DETECTED Final   Cyclospora cayetanensis NOT DETECTED NOT DETECTED Final   Entamoeba histolytica NOT DETECTED NOT DETECTED Final   Giardia lamblia NOT DETECTED NOT DETECTED Final   Adenovirus F40/41 NOT DETECTED NOT DETECTED Final   Astrovirus NOT DETECTED NOT DETECTED Final   Norovirus GI/GII NOT DETECTED NOT DETECTED Final   Rotavirus A NOT DETECTED NOT DETECTED Final   Sapovirus (I, II, IV, and V) NOT DETECTED NOT DETECTED Final  MRSA PCR Screening     Status: None   Collection Time: 11/04/15  9:23 AM  Result Value Ref Range  Status   MRSA by PCR NEGATIVE NEGATIVE Final    Comment:        The GeneXpert MRSA Assay (FDA approved for NASAL specimens only), is one component of a comprehensive MRSA colonization surveillance program. It is not intended to diagnose MRSA infection nor to guide or monitor treatment for MRSA infections.     Medical History: Past Medical History  Diagnosis Date  . Hypertension   . Chronic hip pain   . GERD (gastroesophageal reflux disease)   . Depression   . Hypercholesterolemia   . Type II diabetes mellitus ([place])   . Pneumonia 1960's X 1  . Anemia   . Sleep apnea     "couldn't wear the mask" (04/26/2015)  . Thalassemia minor   . Arthritis     "left hip" (04/26/2015)  . Cellulitis and abscess of leg hositalized 04/25/2015    left    Assessment: [name] with anemia of chronic disease. Admitted with low Hg but is a Jehovah's Witness and refuses blood transfusion. Pharmacy consulted to dose Aranesp. 2/16 labs Hg 6.1. Fe panel: Fe low 12, Tsat low 6, ferritin 324. No new labs. 2/16 Feraheme 550 mg x 1.  2/16 Aranesp 60 mcg (Wt~107kg) On ferrous sulfate PO TID  Goal of Therapy:  Improvement in Hg levels  Plan:  Aranesp 81mcg [place] q7 days per protocol for wt>100kg Follow up CBC 2/20 Monitor Hg trend, and check prior to next dose  Thank you for allowing [place] to participate in this patients care. Jens Som, PharmD Pager: [phone] 11/12/2015 11:58 AM

## 2015-11-12 NOTE — Progress Notes (Signed)
PROGRESS NOTE  [name] [id number] DOB: 02-19-1956 DOA: 11/02/2015 PCP: Charlsie Merles, MD  HPI/Recap of past 80 hours: 60 year old male from a nursing facility with history of diabetes mellitus and chronic lower extremity ulcers admitted for low hemoglobin  (reportedly at 5) and sepsis secondary to lower extremity ulcers., Getting courses the patient is a Jehovah's Witness and refuses transfusions of blood. Patient noted to have hyperkalemia and acute kidney injury.  Monitored closely and hemoglobin has remained stable around 6.   Despite broad-spectrum antibiotics, he continued to spike fevers. Urine and chest x-ray and exam unrevealing.Dopplers on admission ruled out DVT. CT scan done noted no signs of abscess. Infectious disease consulted who felt that persistent fever and leukocytosis might be vasculitis. Plastics consulted for punch biopsy.    Subjective:  Patient in bed, denies any fever or chills, no headache, no chest or abdominal pain, no new focal weakness. Chronically weak mostly bedbound/wheelchair bound, says walks a few steps with a walker.   Assessment/Plan:   Sepsis secondary to Cellulitis of bilateral lower extremities from chronic ulcers: Infectious disease consulted and ordered a vasculitis workup recommended continuing antibiotics for now. Complement, ANA, RF, hep b surface antigen SPEP RPR ok. CRP 30. Biopsy - Path shows stasis ulcers, no vasculitis. Wounds no longer looking infected. All antibiotics stopped on 11/10/2015 after discussions with ID physician Dr. Johnnye Sima. Monitor off antibiotics for another day if afebrile discharge in the morning with wound care to SNF.    Severe Chronic nonhealing venous stasis ulcers. Acute on chronic for years, baseline wheelchair/walker for years. From SNF. Continue hydrofera blue once back at SNF. We do not stock it here. aquacel Ag+ daily while here. Wound care input appreciated.  ARF with hyperkalemia: Resolved after  hydration and Kayexalate.   Chronic microcytic anemia. Jehovah's Witness.  no signs of active bleeding, Hemoccult stool negative, SPEP negative for M spike. Hemoglobin has ranged around 7-8 likely from chronic disease. Aspirin will be held, her only on PPI along with B-12 supplementation, iron has been adequately replaced, continue Aranesp. Outpatient hematology follow up.     Diarrhea -   resolved: GI pathogen panel negative. C. difficile results consistent with colonization (toxin negative antigen positive )  DM2 uncontrolled, with peripheral vascular complications : CBGs not at goal. will increase Lantus to 25 units. Continue SSI, a1c pending  Lab Results  Component Value Date   HGBA1C 9.0* 04/27/2015   CBG (last 3)   Recent Labs  11/11/15 1640 11/11/15 2058 11/12/15 0759  GLUCAP 257* 230* 208*      Code Status: Full code  Family Communication: None available. Patient is lucid.  Disposition Plan: SNF 1-2 days if stable   DVT prophylaxis. Started on heparin. Hemoccult negative.  Consultants:  Wound care    Infectious disease   plastic surgery  Procedures:  None    Antibiotics:  IV vancomycin 2/9-2/17  IV Zosyn 2/9-2/17    Objective: BP 126/70 mmHg  Pulse 83  Temp(Src) 98.6 F (37 C) (Oral)  Resp 18  Ht 6\' 4"  (1.93 m)  Wt 105.4 kg (232 lb 5.8 oz)  BMI 28.30 kg/m2  SpO2 96%  Intake/Output Summary (Last 24 hours) at 11/12/15 1011 Last data filed at 11/12/15 0653  Gross per 24 hour  Intake   1545 ml  Output   1700 ml  Net   -155 ml   Filed Weights   11/02/15 2100 11/07/15 0506 11/12/15 0524  Weight: 99.9 kg (220 lb 3.8 oz)  107.6 kg (237 lb 3.4 oz) 105.4 kg (232 lb 5.8 oz)    Exam:   General:  alert and oriented.  Cardiovascular: Regular rate and rhythm, S1-S2    Respiratory: Clear to auscultation bilaterally  Abdomen: Soft, nontender, nondistended, hypoactive bowel sounds  Musculoskeletal: No edema. Multiple ulcers examined 2/16  with whitish exudate and heaped up borders. No surrounding erythema. Protection boots bilaterally  Data Reviewed: Basic Metabolic Panel:  Recent Labs Lab 11/07/15 0730 11/10/15 0607 11/12/15 0530  NA 137 138 139  K 4.6 4.0 4.0  CL 104 103 102  CO2 19* 27 26  GLUCOSE 225* 212* 259*  BUN 12 13 13   CREATININE 0.95 0.95 0.78  CALCIUM 8.4* 8.5* 8.8*  MG  --   --  1.9   Liver Function Tests:  Recent Labs Lab 11/12/15 0530  AST 28  ALT 38  ALKPHOS 316*  BILITOT 0.3  PROT 7.6  ALBUMIN 1.9*   No results for input(s): LIPASE, AMYLASE in the last 168 hours. No results for input(s): AMMONIA in the last 168 hours. CBC:  Recent Labs Lab 11/07/15 0730 11/09/15 0759  WBC 15.4* 11.7*  HGB 6.1* 6.1*  HCT 19.5* 20.6*  MCV 59.5* 59.2*  PLT 318 373   Cardiac Enzymes:    Recent Labs Lab 11/07/15 1631  CKTOTAL 80   BNP (last 3 results) No results for input(s): BNP in the last 8760 hours.  ProBNP (last 3 results) No results for input(s): PROBNP in the last 8760 hours.  CBG:  Recent Labs Lab 11/11/15 0752 11/11/15 1204 11/11/15 1640 11/11/15 2058 11/12/15 0759  GLUCAP 177* 309* 257* 230* 208*    Recent Results (from the past 240 hour(s))  Blood Culture (routine x 2)     Status: None   Collection Time: 11/02/15  8:15 PM  Result Value Ref Range Status   Specimen Description BLOOD RIGHT ANTECUBITAL  Final   Special Requests BOTTLES DRAWN AEROBIC AND ANAEROBIC 5CC  Final   Culture NO GROWTH 5 DAYS  Final   Report Status 11/07/2015 FINAL  Final  Blood Culture (routine x 2)     Status: None   Collection Time: 11/02/15  8:35 PM  Result Value Ref Range Status   Specimen Description BLOOD LEFT ANTECUBITAL  Final   Special Requests BOTTLES DRAWN AEROBIC AND ANAEROBIC 3CC  Final   Culture NO GROWTH 5 DAYS  Final   Report Status 11/07/2015 FINAL  Final  Urine culture     Status: None   Collection Time: 11/02/15 10:58 PM  Result Value Ref Range Status   Specimen  Description URINE, CLEAN CATCH  Final   Special Requests NONE  Final   Culture MULTIPLE SPECIES PRESENT, SUGGEST RECOLLECTION  Final   Report Status 11/04/2015 FINAL  Final  C difficile quick scan w PCR reflex     Status: Abnormal   Collection Time: 11/03/15 12:47 AM  Result Value Ref Range Status   C Diff antigen POSITIVE (A) NEGATIVE Final   C Diff toxin NEGATIVE NEGATIVE Final   C Diff interpretation   Final    C. difficile present, but toxin not detected. This indicates colonization. In most cases, this does not require treatment. If patient has signs and symptoms consistent with colitis, consider treatment. Requires ENTERIC precautions.  Gastrointestinal Panel by PCR , Stool     Status: None   Collection Time: 11/03/15 12:47 AM  Result Value Ref Range Status   Campylobacter species NOT DETECTED NOT DETECTED Final  Plesimonas shigelloides NOT DETECTED NOT DETECTED Final   Salmonella species NOT DETECTED NOT DETECTED Final   Yersinia enterocolitica NOT DETECTED NOT DETECTED Final   Vibrio species NOT DETECTED NOT DETECTED Final   Vibrio cholerae NOT DETECTED NOT DETECTED Final   Enteroaggregative E coli (EAEC) NOT DETECTED NOT DETECTED Final   Enteropathogenic E coli (EPEC) NOT DETECTED NOT DETECTED Final   Enterotoxigenic E coli (ETEC) NOT DETECTED NOT DETECTED Final   Shiga like toxin producing E coli (STEC) NOT DETECTED NOT DETECTED Final   E. coli O157 NOT DETECTED NOT DETECTED Final   Shigella/Enteroinvasive E coli (EIEC) NOT DETECTED NOT DETECTED Final   Cryptosporidium NOT DETECTED NOT DETECTED Final   Cyclospora cayetanensis NOT DETECTED NOT DETECTED Final   Entamoeba histolytica NOT DETECTED NOT DETECTED Final   Giardia lamblia NOT DETECTED NOT DETECTED Final   Adenovirus F40/41 NOT DETECTED NOT DETECTED Final   Astrovirus NOT DETECTED NOT DETECTED Final   Norovirus GI/GII NOT DETECTED NOT DETECTED Final   Rotavirus A NOT DETECTED NOT DETECTED Final   Sapovirus (I, II,  IV, and V) NOT DETECTED NOT DETECTED Final  MRSA PCR Screening     Status: None   Collection Time: 11/04/15  9:23 AM  Result Value Ref Range Status   MRSA by PCR NEGATIVE NEGATIVE Final    Comment:        The GeneXpert MRSA Assay (FDA approved for NASAL specimens only), is one component of a comprehensive MRSA colonization surveillance program. It is not intended to diagnose MRSA infection nor to guide or monitor treatment for MRSA infections.      Studies: No results found.  Scheduled Meds: . sodium chloride   Intravenous Once  . atorvastatin  80 mg Oral Daily  . darbepoetin (ARANESP) injection - NON-DIALYSIS  60 mcg Subcutaneous Q Thu-1800  . feeding supplement (ENSURE ENLIVE)  237 mL Oral BID  . feeding supplement (PRO-STAT SUGAR FREE 64)  30 mL Oral TID WC  . ferrous sulfate  325 mg Oral TID WC  . heparin subcutaneous  5,000 Units Subcutaneous 3 times per day  . insulin aspart  0-20 Units Subcutaneous TID WC  . insulin aspart  0-5 Units Subcutaneous QHS  . insulin glargine  21 Units Subcutaneous QHS  . lidocaine-EPINEPHrine  10 mL Intradermal Once  . mirtazapine  15 mg Oral QHS  . multivitamin with minerals  1 tablet Oral Daily  . oxybutynin  10 mg Oral QHS  . oxyCODONE  20 mg Oral Q12H  . pantoprazole  40 mg Oral BID  . pregabalin  75 mg Oral BID  . sodium chloride flush  3 mL Intravenous Q12H  . tamsulosin  0.4 mg Oral QPC supper    Continuous Infusions:     Time spent: 25 minutes   Thurnell Lose MD  [facility] [phone] [email or web address], password [email or web address] 11/12/2015, 10:11 AM  LOS: 10 days

## 2015-11-12 NOTE — Progress Notes (Signed)
CSW contacted facility to inform of patient's anticipated discharge in one or two days. However, Admissions Coordinator Izola Price was not in on today. CSW left message for Izola Price to return call.  CSW will continue to follow and provide support to patient while in hospital.   Lucius Conn, [place] Worker [facility] Ph: [phone]

## 2015-11-13 LAB — HEMOGLOBIN A1C
Hgb A1c MFr Bld: 8.1 % — ABNORMAL HIGH (ref 4.8–5.6)
Mean Plasma Glucose: 186 mg/dL

## 2015-11-13 LAB — CBC
HEMATOCRIT: 23.3 % — AB (ref 39.0–52.0)
Hemoglobin: 6.8 g/dL — CL (ref 13.0–17.0)
MCH: 18.3 pg — ABNORMAL LOW (ref 26.0–34.0)
MCHC: 29.2 g/dL — ABNORMAL LOW (ref 30.0–36.0)
MCV: 62.8 fL — AB (ref 78.0–100.0)
Platelets: 484 10*3/uL — ABNORMAL HIGH (ref 150–400)
RBC: 3.71 MIL/uL — AB (ref 4.22–5.81)
RDW: 20.4 % — AB (ref 11.5–15.5)
WBC: 9.8 10*3/uL (ref 4.0–10.5)

## 2015-11-13 LAB — GLUCOSE, CAPILLARY
GLUCOSE-CAPILLARY: 164 mg/dL — AB (ref 65–99)
GLUCOSE-CAPILLARY: 294 mg/dL — AB (ref 65–99)

## 2015-11-13 MED ORDER — LISINOPRIL 2.5 MG PO TABS: 2.5000 mg | ORAL_TABLET | Freq: Every day | ORAL | Status: DC

## 2015-11-13 MED ORDER — INSULIN ASPART 100 UNIT/ML ~~LOC~~ SOLN
SUBCUTANEOUS | Status: DC
Start: 2015-11-13 — End: 2021-09-03

## 2015-11-13 MED ORDER — PANTOPRAZOLE SODIUM 40 MG PO TBEC: 40.0000 mg | DELAYED_RELEASE_TABLET | Freq: Two times a day (BID) | ORAL | Status: DC

## 2015-11-13 MED ORDER — CARVEDILOL 3.125 MG PO TABS: 3.1250 mg | ORAL_TABLET | Freq: Two times a day (BID) | ORAL | Status: DC

## 2015-11-13 MED ORDER — OXYCODONE HCL ER 10 MG PO T12A: 10.0000 mg | EXTENDED_RELEASE_TABLET | Freq: Two times a day (BID) | ORAL | Status: DC

## 2015-11-13 MED ORDER — OXYCODONE HCL 5 MG PO TABS: 5.0000 mg | ORAL_TABLET | ORAL | Status: DC | PRN

## 2015-11-13 NOTE — Progress Notes (Signed)
Patient will DC to: [facility] Anticipated DC date: 11/13/15 Family notified: Left vm for spouse Transport by: [facility] 2pm  CSW signing off.  Cedric Fishman, [place] Social Worker [phone]

## 2015-11-13 NOTE — Discharge Summary (Signed)
[name], is a 60 y.o. male  DOB July 06, 1956  MRN [id number].  Admission date:  11/02/2015  Admitting Physician  [name] Costa, MD  Discharge Date:  11/13/2015   Primary MD  [name] Merles, MD  Recommendations for primary care physician for things to follow:    Continue treatment for bilateral lower extremity venous stasis ulcers, finished antibiotics, will benefit from own Unna boots and edema reduction therapy. Outpatient wound care.  Monitor A1c and glycemic control, check CBC BMP in 3-4 days. Monitor iron panel and B-12 closely.  Outpatient hematology follow-up for anemia.  Admission Diagnosis  Leg swelling [M79.89] AKI (acute kidney injury) ([place]) [N17.9] Cellulitis of lower extremity, unspecified laterality [L03.119] Sepsis, due to unspecified organism [facility]) [A41.9] Nonhealing ulcer of left lower extremity ([place]) [L97.929]   Discharge Diagnosis  Leg swelling [M79.89] AKI (acute kidney injury) ([place]) [N17.9] Cellulitis of lower extremity, unspecified laterality [id number] Sepsis, due to unspecified organism [facility]) [A41.9] Nonhealing ulcer of left lower extremity ([place]) [L97.929]     Principal Problem:   Cellulitis of leg Active Problems:   Sepsis ([place])   DM (diabetes mellitus), secondary, uncontrolled, with peripheral vascular complications (HCC)   Microcytic anemia   Leg ulcer (HCC)   AKI (acute kidney injury) ([place])   Hyperkalemia   Hypoglycemia      Past Medical History  Diagnosis Date  . Hypertension   . Chronic hip pain   . GERD (gastroesophageal reflux disease)   . Depression   . Hypercholesterolemia   . Type II diabetes mellitus ([place])   . Pneumonia 1960's X 1  . Anemia   . Sleep apnea     "couldn't wear the mask" (04/26/2015)  . Thalassemia minor   . Arthritis     "left hip" (04/26/2015)    . Cellulitis and abscess of leg hositalized 04/25/2015    left    Past Surgical History  Procedure Laterality Date  . Inguinal hernia repair Right ~ 2004  . Shoulder arthroscopy w/ rotator cuff repair Right ~ 2004  . Toenail excision Right 1980's X 2    "big toe"       HPI  from the history and physical done on the day of admission:    60 year old male from a nursing facility with history of diabetes mellitus and chronic lower extremity ulcers admitted for low hemoglobin (reportedly at 5) and sepsis secondary to lower extremity ulcers., Getting courses the patient is a Jehovah's Witness and refuses transfusions of blood. Patient noted to have hyperkalemia and acute kidney injury. Monitored closely and hemoglobin has remained stable around 6.   Despite broad-spectrum antibiotics, he continued to spike fevers. Urine and chest x-ray and exam unrevealing.Dopplers on admission ruled out DVT. CT scan done noted no signs of abscess. Infectious disease consulted who felt that persistent fever and leukocytosis might be vasculitis. Plastics consulted for punch biopsy. Punch [name] consistent with stasis dermatitis.     Hospital Course:     Sepsis secondary to Cellulitis of bilateral lower extremities from chronic  ulcers: Infectious disease consulted and ordered a vasculitis workup recommended continuing antibiotics for now. Complement, ANA, RF, hep b surface antigen SPEP RPR ok. CRP 30. Biopsy - Path shows stasis ulcers, no vasculitis. Wounds no longer looking infected. All antibiotics stopped on 11/10/2015 after discussions with ID physician Dr. Johnnye Sima. Has remained afebrile for 48 hours, will be discharged to SNF, recommend SNF to consider oral bruits, gentle diuresis, continue wound care as before for lower extremity ulcers.   Severe Chronic nonhealing venous stasis ulcers. Wound care input appreciated. Continue wound care at SNF, may benefit from outpatient wound care treatments.  ARF  with hyperkalemia: Resolved after hydration and Kayexalate. Reduced ACE inhibitor dose upon discharge. Stopped NSAIDs and Glucophage.   Chronic microcytic anemia. Jehovah's Witness. no signs of active bleeding, Hemoccult stool negative, SPEP negative for M spike. Hemoglobin has ranged around 7-8 likely from chronic disease. Resume aspirin is no signs of occult bleed, now on PPI, iron has been adequately replaced, and will place on oral iron upon discharge. Was treated with Aranesp. Outpatient hematology follow up within 1-2 weeks.    Diarrhea - resolved: GI pathogen panel negative. C. difficile results consistent with colonization (toxin negative antigen positive )  DM2 uncontrolled, with peripheral vascular complications : Pending [id number], monitor A1c results, monitor CBGs every before meals and at bedtime, continue home regimen, sliding-scale every before meals added.     Discharge Condition: Fair  Follow UP  Follow-up Information    Follow up with [facility].   Specialty:  [place] information:   [address] [phone]      Follow up with [name] Merles, MD. Schedule an appointment as soon as possible for a visit in 1 week.   Specialty:  Internal Medicine   Contact information:   [place] [address] [phone]        Consults obtained -     Wound care   Infectious disease  plastic surgery  Diet and Activity recommendation: See Discharge Instructions below  Discharge Instructions           Discharge Instructions    Discharge instructions    Complete by:  As directed   Follow with Primary MD [name] Merles, MD in 7 days   Get CBC, CMP, 2 view Chest X ray checked  by Primary MD next visit.    Activity: As tolerated with Full fall precautions use walker/cane & assistance as needed   Disposition Home    Diet:   Heart Healthy  Low  Carb  Accuchecks 4 times/day, Once in AM empty stomach and then before each meal. Log in all results and show them to your Prim.MD in 3 days. If any glucose reading is under 80 or above 300 call your Prim MD immidiately. Follow Low glucose instructions for glucose under 80 as instructed.   For Heart failure patients - Check your Weight same time everyday, if you gain over 2 pounds, or you develop in leg swelling, experience more shortness of breath or chest pain, call your Primary MD immediately. Follow Cardiac Low Salt Diet and 1.5 lit/day fluid restriction.   On your next visit with your primary care physician please Get Medicines reviewed and adjusted.   Please request your Prim.MD to go over all Hospital Tests and Procedure/Radiological results at the follow up, please get all Hospital records sent to your Prim MD by signing hospital release before you  go home.   If you experience worsening of your admission symptoms, develop shortness of breath, life threatening emergency, suicidal or homicidal thoughts you must seek medical attention immediately by calling 911 or calling your MD immediately  if symptoms less severe.  You Must read complete instructions/literature along with all the possible adverse reactions/side effects for all the Medicines you take and that have been prescribed to you. Take any new Medicines after you have completely understood and accpet all the possible adverse reactions/side effects.   Do not drive, operating heavy machinery, perform activities at heights, swimming or participation in water activities or provide baby sitting services if your were admitted for syncope or siezures until you have seen by Primary MD or a Neurologist and advised to do so again.  Do not drive when taking Pain medications.    Do not take more than prescribed Pain, Sleep and Anxiety Medications  Special Instructions: If you have smoked or chewed Tobacco  in the last 2 yrs please stop  smoking, stop any regular Alcohol  and or any Recreational drug use.  Wear Seat belts while driving.   Please note  You were cared for by a hospitalist during your hospital stay. If you have any questions about your discharge medications or the care you received while you were in the hospital after you are discharged, you can call the unit and asked to speak with the hospitalist on call if the hospitalist that took care of you is not available. Once you are discharged, your primary care physician will handle any further medical issues. Please note that NO REFILLS for any discharge medications will be authorized once you are discharged, as it is imperative that you return to your primary care physician (or establish a relationship with a primary care physician if you do not have one) for your aftercare needs so that they can reassess your need for medications and monitor your lab values.     Increase activity slowly    Complete by:  As directed              Discharge Medications       Medication List    STOP taking these medications        diclofenac 75 MG EC tablet  Commonly known as:  VOLTAREN     meloxicam 7.5 MG tablet  Commonly known as:  MOBIC     metFORMIN 1000 MG tablet  Commonly known as:  GLUCOPHAGE      TAKE these medications        acetaminophen 325 MG tablet  Commonly known as:  TYLENOL  Take 2 tablets (650 mg total) by mouth every 6 (six) hours as needed for mild pain (or Fever >/= 101).     aspirin EC 81 MG tablet  Take 81 mg by mouth every morning.     atorvastatin 80 MG tablet  Commonly known as:  LIPITOR  Take 80 mg by mouth daily.     BOOST GLUCOSE CONTROL PO  Take 1 each by mouth 2 (two) times daily.     carvedilol 3.125 MG tablet  Commonly known as:  COREG  Take 1 tablet (3.125 mg total) by mouth 2 (two) times daily with a meal.     docusate sodium 100 MG capsule  Commonly known as:  COLACE  Take 1 capsule (100 mg total) by mouth 2 (two)  times daily as needed for mild constipation.     enoxaparin 150 MG/ML injection  Commonly  known as:  LOVENOX  Inject 0.36 mLs (55 mg total) into the skin daily.     famotidine 20 MG tablet  Commonly known as:  PEPCID  Take 1 tablet (20 mg total) by mouth 2 (two) times daily.     feeding supplement (PRO-STAT SUGAR FREE 64) Liqd  Take 30 mLs by mouth 3 (three) times daily with meals.     ferrous sulfate 325 (65 FE) MG tablet  Take 325 mg by mouth 3 (three) times daily with meals.     insulin aspart 100 UNIT/ML injection  Commonly known as:  novoLOG  Inject 0-9 Units into the skin 3 (three) times daily with meals.     insulin aspart 100 UNIT/ML injection  Commonly known as:  NOVOLOG  Before each meal 3 times a day, 140-199 - 2 units, 200-250 - 4 units, 251-299 - 6 units,  300-349 - 8 units,  350 or above 10 units. Dispense syringes and needles as needed, Ok to switch to PEN if approved. Substitute to any brand approved. DX DM2, Code E11.65     insulin aspart protamine- aspart (70-30) 100 UNIT/ML injection  Commonly known as:  NOVOLOG MIX 70/30  Inject 0.25 mLs (25 Units total) into the skin 2 (two) times daily with a meal.     lisinopril 2.5 MG tablet  Commonly known as:  PRINIVIL,ZESTRIL  Take 1 tablet (2.5 mg total) by mouth daily.     mirtazapine 15 MG tablet  Commonly known as:  REMERON  Take 15 mg by mouth at bedtime.     multivitamin with minerals Tabs tablet  Take 1 tablet by mouth daily.     ondansetron 4 MG tablet  Commonly known as:  ZOFRAN  Take 1 tablet (4 mg total) by mouth every 6 (six) hours as needed for nausea.     oxybutynin 10 MG 24 hr tablet  Commonly known as:  DITROPAN-XL  Take 10 mg by mouth at bedtime.     oxyCODONE 5 MG immediate release tablet  Commonly known as:  Oxy IR/ROXICODONE  Take 1 tablet (5 mg total) by mouth every 4 (four) hours as needed for severe pain.     oxyCODONE 10 mg 12 hr tablet  Commonly known as:  OXYCONTIN  Take 1  tablet (10 mg total) by mouth every 12 (twelve) hours.     pantoprazole 40 MG tablet  Commonly known as:  PROTONIX  Take 1 tablet (40 mg total) by mouth 2 (two) times daily.     polyethylene glycol packet  Commonly known as:  MIRALAX / GLYCOLAX  Take 17 g by mouth daily as needed for mild constipation.     pregabalin 75 MG capsule  Commonly known as:  LYRICA  Take 75 mg by mouth 2 (two) times daily.     tamsulosin 0.4 MG Caps capsule  Commonly known as:  FLOMAX  Take 0.4 mg by mouth daily after supper.        Major procedures and Radiology Reports - PLEASE review detailed and final reports for all details, in brief -    Skin Biopsy - stasis dermatitis   Dg Tibia/fibula Left  11/02/2015  CLINICAL DATA:  60 year old male with a chronic wounds on the bilateral lower legs. Rule out subcutaneous gas. EXAM: LEFT TIBIA AND FIBULA - 2 VIEW COMPARISON:  Radiograph dated 04/26/2015 and right lower extremity radiograph dated 11/02/2015 FINDINGS: There is no acute fracture or dislocation. No periosteal reaction or erosive changes identified to  suggest osteomyelitis. No soft tissue gas identified. There is diffuse subcutaneous soft tissue stranding and edema. Dressing noted over the mid calf. IMPRESSION: No acute osseous pathology. No Soft tissue gas. Electronically Signed   By: Anner Crete M.D.   On: 11/02/2015 21:48   Dg Tibia/fibula Right  11/02/2015  CLINICAL DATA:  Chronic right lower extremity wounds. Initial encounter. EXAM: RIGHT TIBIA AND FIBULA - 2 VIEW COMPARISON:  Right tibia/fibula radiographs performed 04/26/2015 FINDINGS: There is no evidence of fracture or dislocation. The tibia and fibula appear grossly intact. Known soft tissue wounds are not well characterized. The ankle joint is grossly unremarkable in appearance. The knee joint is grossly unremarkable, aside from a large enthesophyte arising at the upper pole of the patella. A fabella is noted. IMPRESSION: No evidence of  fracture or dislocation. Known soft tissue wounds are not well characterized. Electronically Signed   By: Garald Balding M.D.   On: 11/02/2015 21:48   US Renal  11/03/2015  CLINICAL DATA:  60 year old male with acute renal insufficiency EXAM: RENAL / URINARY TRACT ULTRASOUND COMPLETE COMPARISON:  CT dated 03/17/2015 FINDINGS: Right Kidney: Length: 11.1 cm. Echogenicity within normal limits. No mass or hydronephrosis visualized. Left Kidney: Length: 11.7 cm. Echogenicity within normal limits. No mass or hydronephrosis visualized. Bladder: The urinary bladder is collapsed and not visualized. IMPRESSION: No hydronephrosis or echogenic stone. Electronically Signed   By: Anner Crete M.D.   On: 11/03/2015 01:17   Dg Chest Port 1 View  11/02/2015  CLINICAL DATA:  60 year old male with sepsis EXAM: PORTABLE CHEST 1 VIEW COMPARISON:  Radiograph dated 03/26/2015 FINDINGS: Single-view of the chest demonstrate hypovolemic lungs. There are mild bibasilar atelectatic changes noted. There is no focal consolidation, pleural effusion, or pneumothorax. Minimal vascular prominence may represent mild congestion. The cardiac silhouette is within normal limits. The osseous structures appear unremarkable. IMPRESSION: No focal consolidation. Electronically Signed   By: Anner Crete M.D.   On: 11/02/2015 20:45   Ct Extrem Lower W Cm Bil  11/06/2015  CLINICAL DATA:  Sepsis secondary to cellulitis. Bilateral lower extremity cellulitis and chronic ulcers. Continued fevers despite the use of broad-spectrum antibiotics. EXAM: CT OF THE RIGHT TIBIA/FIBULA WITH CONTRAST CT OF THE LEFT TIBIA/FIBULA WITH CONTRAST CT OF THE RIGHT FOOT WITH CONTRAST, LIMITED CT OF THE LEFT FOOT WITH CONTRAST, LIMITED TECHNIQUE: Multidetector CT imaging of the bilateral lower leg was performed according to the standard protocol following intravenous contrast administration. COMPARISON:  11/02/2015 CONTRAST:  100 cc Omnipaque 300 FINDINGS: CT OF THE  RIGHT TIBIA/ FIBULA: Chronically fragmented spurring in the distal quadriceps tendon. today's exam includes the knee as well as the tibia and fibula and foot. Small right knee effusion. Diffuse subcutaneous edema in the calf. Diffuse fatty atrophy of the right peroneus longus muscle. Suspected ulceration along the medial right ankle, superficial to the medial malleolus. No bony destructive findings in the tibia or fibula. No discrete drainable abscess is detected. CT OF THE LEFT TIBIA/ FIBULA: The knee was included. Distal quadriceps spurring. Popliteal artery atherosclerotic calcification. Medial compartmental articular space narrowing compatible with osteoarthritis. Diffuse subcutaneous edema along the calf. Skin and subcutaneous tissue irregularity along the posterior and lateral calf tracking down towards the ankle, suspicious for ulceration and skin wounds. I do not observe a discrete drainable abscess. No bony destructive findings compatible with osteomyelitis. CT OF THE RIGHT FOOT: Please note that multiplanar reconstructions were performed with respect to the tibia and fibula, and not with the foot. This lowers  the utility of today's exam as a foot study. There is diffuse subcutaneous edema tracking along the ankle and the dorsum of the foot. Mild degenerative midfoot findings. No bony destructive findings in the foot to favor osteomyelitis. No drainable abscess in the right foot. Diffuse atrophy of the plantar foot musculature. There is a 5 mm non-fragmented osteochondral lesion along the medial talar dome on image 109 of series 11. CT OF THE LEFT FOOT: Subcutaneous edema both medially and laterally along the ankle. Is subcutaneous edema along the dorsum of the foot and along the medial ball of the foot. No bony destructive findings are identified. IMPRESSION: 1. Diffuse subcutaneous edema in both calves and tracking along the dorsum of both feet. Skin and subcutaneous irregularities compatible with  ulcerations more notable on the posterolateral left calf and along the medial right ankle. No drainable abscess or bony destructive findings characteristic of osteomyelitis. Electronically Signed   By: Van Clines M.D.   On: 11/06/2015 07:18    Micro Results      Recent Results (from the past 240 hour(s))  MRSA PCR Screening     Status: None   Collection Time: 11/04/15  9:23 AM  Result Value Ref Range Status   MRSA by PCR NEGATIVE NEGATIVE Final    Comment:        The GeneXpert MRSA Assay (FDA approved for NASAL specimens only), is one component of a comprehensive MRSA colonization surveillance program. It is not intended to diagnose MRSA infection nor to guide or monitor treatment for MRSA infections.        Today   Subjective    [name] today has no headache,no chest abdominal pain,no new weakness tingling or numbness, feels much better wants to go home today.     Objective   Blood pressure 128/79, pulse 85, temperature 98.9 F (37.2 C), temperature source Oral, resp. rate 18, height 6\' 4"  (1.93 m), weight 105.4 kg (232 lb 5.8 oz), SpO2 93 %.   Intake/Output Summary (Last 24 hours) at 11/13/15 1019 Last data filed at 11/13/15 0657  Gross per 24 hour  Intake    480 ml  Output   1300 ml  Net   -820 ml    Exam Awake Alert, Oriented x 3, No new F.N deficits, Normal affect [place].AT,PERRAL Supple Neck,No JVD, No cervical lymphadenopathy appriciated.  Symmetrical Chest wall movement, Good air movement bilaterally, CTAB RRR,No Gallops,Rubs or new Murmurs, No Parasternal Heave +ve B.Sounds, Abd Soft, Non tender, No organomegaly appriciated, No rebound -guarding or rigidity. No Cyanosis, Clubbing or edema, No new Rash or bruise, Multiple ulcers examined in both lower extrimities with whitish exudate and heaped up borders. No surrounding erythema. Protection boots bilaterally   Data Review   CBC w Diff:  Lab Results  Component Value Date   WBC 9.8  11/13/2015   HGB 6.8* 11/13/2015   HCT 23.3* 11/13/2015   PLT 484* 11/13/2015   LYMPHOPCT 8 11/02/2015   MONOPCT 4 11/02/2015   EOSPCT 1 11/02/2015   BASOPCT 0 11/02/2015    CMP:  Lab Results  Component Value Date   NA 139 11/12/2015   K 4.0 11/12/2015   CL 102 11/12/2015   CO2 26 11/12/2015   BUN 13 11/12/2015   CREATININE 0.78 11/12/2015   PROT 7.6 11/12/2015   ALBUMIN 1.9* 11/12/2015   BILITOT 0.3 11/12/2015   ALKPHOS 316* 11/12/2015   AST 28 11/12/2015   ALT 38 11/12/2015  . Lab Results  Component Value  Date   HGBA1C 9.0* 04/27/2015    Total Time in preparing paper work, data evaluation and todays exam - 35 minutes  Thurnell Lose M.D on 11/13/2015 at 10:19 AM  [facility]   Office  [phone]

## 2015-11-13 NOTE — Discharge Instructions (Signed)
Follow with Primary MD Charlsie Merles, MD in 7 days   Get CBC, CMP, 2 view Chest X ray checked  by Primary MD next visit.    Activity: As tolerated with Full fall precautions use walker/cane & assistance as needed   Disposition Home    Diet:   Heart Healthy  Low Carb  Accuchecks 4 times/day, Once in AM empty stomach and then before each meal. Log in all results and show them to your Prim.MD in 3 days. If any glucose reading is under 80 or above 300 call your Prim MD immidiately. Follow Low glucose instructions for glucose under 80 as instructed.   For Heart failure patients - Check your Weight same time everyday, if you gain over 2 pounds, or you develop in leg swelling, experience more shortness of breath or chest pain, call your Primary MD immediately. Follow Cardiac Low Salt Diet and 1.5 lit/day fluid restriction.   On your next visit with your primary care physician please Get Medicines reviewed and adjusted.   Please request your Prim.MD to go over all Hospital Tests and Procedure/Radiological results at the follow up, please get all Hospital records sent to your Prim MD by signing hospital release before you go home.   If you experience worsening of your admission symptoms, develop shortness of breath, life threatening emergency, suicidal or homicidal thoughts you must seek medical attention immediately by calling 911 or calling your MD immediately  if symptoms less severe.  You Must read complete instructions/literature along with all the possible adverse reactions/side effects for all the Medicines you take and that have been prescribed to you. Take any new Medicines after you have completely understood and accpet all the possible adverse reactions/side effects.   Do not drive, operating heavy machinery, perform activities at heights, swimming or participation in water activities or provide baby sitting services if your were admitted for syncope or siezures until you have  seen by Primary MD or a Neurologist and advised to do so again.  Do not drive when taking Pain medications.    Do not take more than prescribed Pain, Sleep and Anxiety Medications  Special Instructions: If you have smoked or chewed Tobacco  in the last 2 yrs please stop smoking, stop any regular Alcohol  and or any Recreational drug use.  Wear Seat belts while driving.   Please note  You were cared for by a hospitalist during your hospital stay. If you have any questions about your discharge medications or the care you received while you were in the hospital after you are discharged, you can call the unit and asked to speak with the hospitalist on call if the hospitalist that took care of you is not available. Once you are discharged, your primary care physician will handle any further medical issues. Please note that NO REFILLS for any discharge medications will be authorized once you are discharged, as it is imperative that you return to your primary care physician (or establish a relationship with a primary care physician if you do not have one) for your aftercare needs so that they can reassess your need for medications and monitor your lab values.

## 2015-11-23 ENCOUNTER — Encounter (HOSPITAL_BASED_OUTPATIENT_CLINIC_OR_DEPARTMENT_OTHER): Payer: Medicare Other | Attending: Internal Medicine

## 2015-11-23 DIAGNOSIS — L97812 Non-pressure chronic ulcer of other part of right lower leg with fat layer exposed: Secondary | ICD-10-CM | POA: Diagnosis present

## 2015-11-23 DIAGNOSIS — M1990 Unspecified osteoarthritis, unspecified site: Secondary | ICD-10-CM | POA: Insufficient documentation

## 2015-11-23 DIAGNOSIS — G4730 Sleep apnea, unspecified: Secondary | ICD-10-CM | POA: Insufficient documentation

## 2015-11-23 DIAGNOSIS — L97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ed: Secondary | ICD-10-CM | POA: Diagnosis not present

## 2015-11-23 DIAGNOSIS — E1140 Type 2 diabetes mellitus with diabetic neuropathy, unspecified: Secondary | ICD-10-CM | POA: Insufficient documentation

## 2015-11-23 DIAGNOSIS — I872 Venous insufficiency (chronic) (peripheral): Secondary | ICD-10-CM | POA: Diagnosis not present

## 2015-11-23 DIAGNOSIS — I10 Essential (primary) hypertension: Secondary | ICD-10-CM | POA: Insufficient documentation

## 2015-12-07 DIAGNOSIS — L97812 Non-pressure chronic ulcer of other part of right lower leg with fat layer exposed: Secondary | ICD-10-CM | POA: Diagnosis not present

## 2015-12-13 DIAGNOSIS — M47816 Spondylosis without myelopathy or radiculopathy, lumbar region: Secondary | ICD-10-CM | POA: Insufficient documentation

## 2015-12-21 DIAGNOSIS — L97812 Non-pressure chronic ulcer of other part of right lower leg with fat layer exposed: Secondary | ICD-10-CM | POA: Diagnosis not present

## 2016-01-04 ENCOUNTER — Encounter (HOSPITAL_BASED_OUTPATIENT_CLINIC_OR_DEPARTMENT_OTHER): Payer: Medicare Other | Attending: Internal Medicine

## 2016-01-04 DIAGNOSIS — L97812 Non-pressure chronic ulcer of other part of right lower leg with fat layer exposed: Secondary | ICD-10-CM | POA: Insufficient documentation

## 2016-01-04 DIAGNOSIS — I87331 Chronic venous hypertension (idiopathic) with ulcer and inflammation of right lower extremity: Secondary | ICD-10-CM | POA: Insufficient documentation

## 2016-01-04 DIAGNOSIS — I10 Essential (primary) hypertension: Secondary | ICD-10-CM | POA: Insufficient documentation

## 2016-01-04 DIAGNOSIS — L97211 Non-pressure chronic ulcer of right calf limited to breakdown of skin: Secondary | ICD-10-CM | POA: Insufficient documentation

## 2016-01-04 DIAGNOSIS — E1140 Type 2 diabetes mellitus with diabetic neuropathy, unspecified: Secondary | ICD-10-CM | POA: Insufficient documentation

## 2016-01-04 DIAGNOSIS — M1990 Unspecified osteoarthritis, unspecified site: Secondary | ICD-10-CM | POA: Insufficient documentation

## 2016-01-04 DIAGNOSIS — G4730 Sleep apnea, unspecified: Secondary | ICD-10-CM | POA: Diagnosis not present

## 2016-01-22 ENCOUNTER — Encounter (HOSPITAL_BASED_OUTPATIENT_CLINIC_OR_DEPARTMENT_OTHER): Payer: Medicare Other | Attending: Internal Medicine

## 2016-01-22 DIAGNOSIS — I10 Essential (primary) hypertension: Secondary | ICD-10-CM | POA: Insufficient documentation

## 2016-01-22 DIAGNOSIS — L97812 Non-pressure chronic ulcer of other part of right lower leg with fat layer exposed: Secondary | ICD-10-CM | POA: Insufficient documentation

## 2016-01-22 DIAGNOSIS — E1140 Type 2 diabetes mellitus with diabetic neuropathy, unspecified: Secondary | ICD-10-CM | POA: Diagnosis not present

## 2016-01-22 DIAGNOSIS — L97211 Non-pressure chronic ulcer of right calf limited to breakdown of skin: Secondary | ICD-10-CM | POA: Diagnosis not present

## 2016-01-22 DIAGNOSIS — M1990 Unspecified osteoarthritis, unspecified site: Secondary | ICD-10-CM | POA: Diagnosis not present

## 2016-01-22 DIAGNOSIS — G4730 Sleep apnea, unspecified: Secondary | ICD-10-CM | POA: Insufficient documentation

## 2016-01-22 DIAGNOSIS — L97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ed: Secondary | ICD-10-CM | POA: Insufficient documentation

## 2016-01-22 DIAGNOSIS — I87331 Chronic venous hypertension (idiopathic) with ulcer and inflammation of right lower extremity: Secondary | ICD-10-CM | POA: Diagnosis present

## 2016-02-09 DIAGNOSIS — I872 Venous insufficiency (chronic) (peripheral): Secondary | ICD-10-CM | POA: Insufficient documentation

## 2016-02-09 DIAGNOSIS — I878 Other specified disorders of veins: Secondary | ICD-10-CM | POA: Insufficient documentation

## 2016-02-23 DIAGNOSIS — F17200 Nicotine dependence, unspecified, uncomplicated: Secondary | ICD-10-CM | POA: Insufficient documentation

## 2016-02-26 ENCOUNTER — Encounter (HOSPITAL_BASED_OUTPATIENT_CLINIC_OR_DEPARTMENT_OTHER): Payer: Medicare Other

## 2016-03-15 IMAGING — DX DG CHEST 1V PORT
1 series · 1 of 1 positions shown · non-contrast
Comparison: Chest radiograph from 03/14/2015, and CTA of the chest
performed 03/17/2015

CLINICAL DATA: Acute onset of generalized chest pain and shortness
of breath. Initial encounter.

EXAM:
PORTABLE CHEST - 1 VIEW

[chest ap]
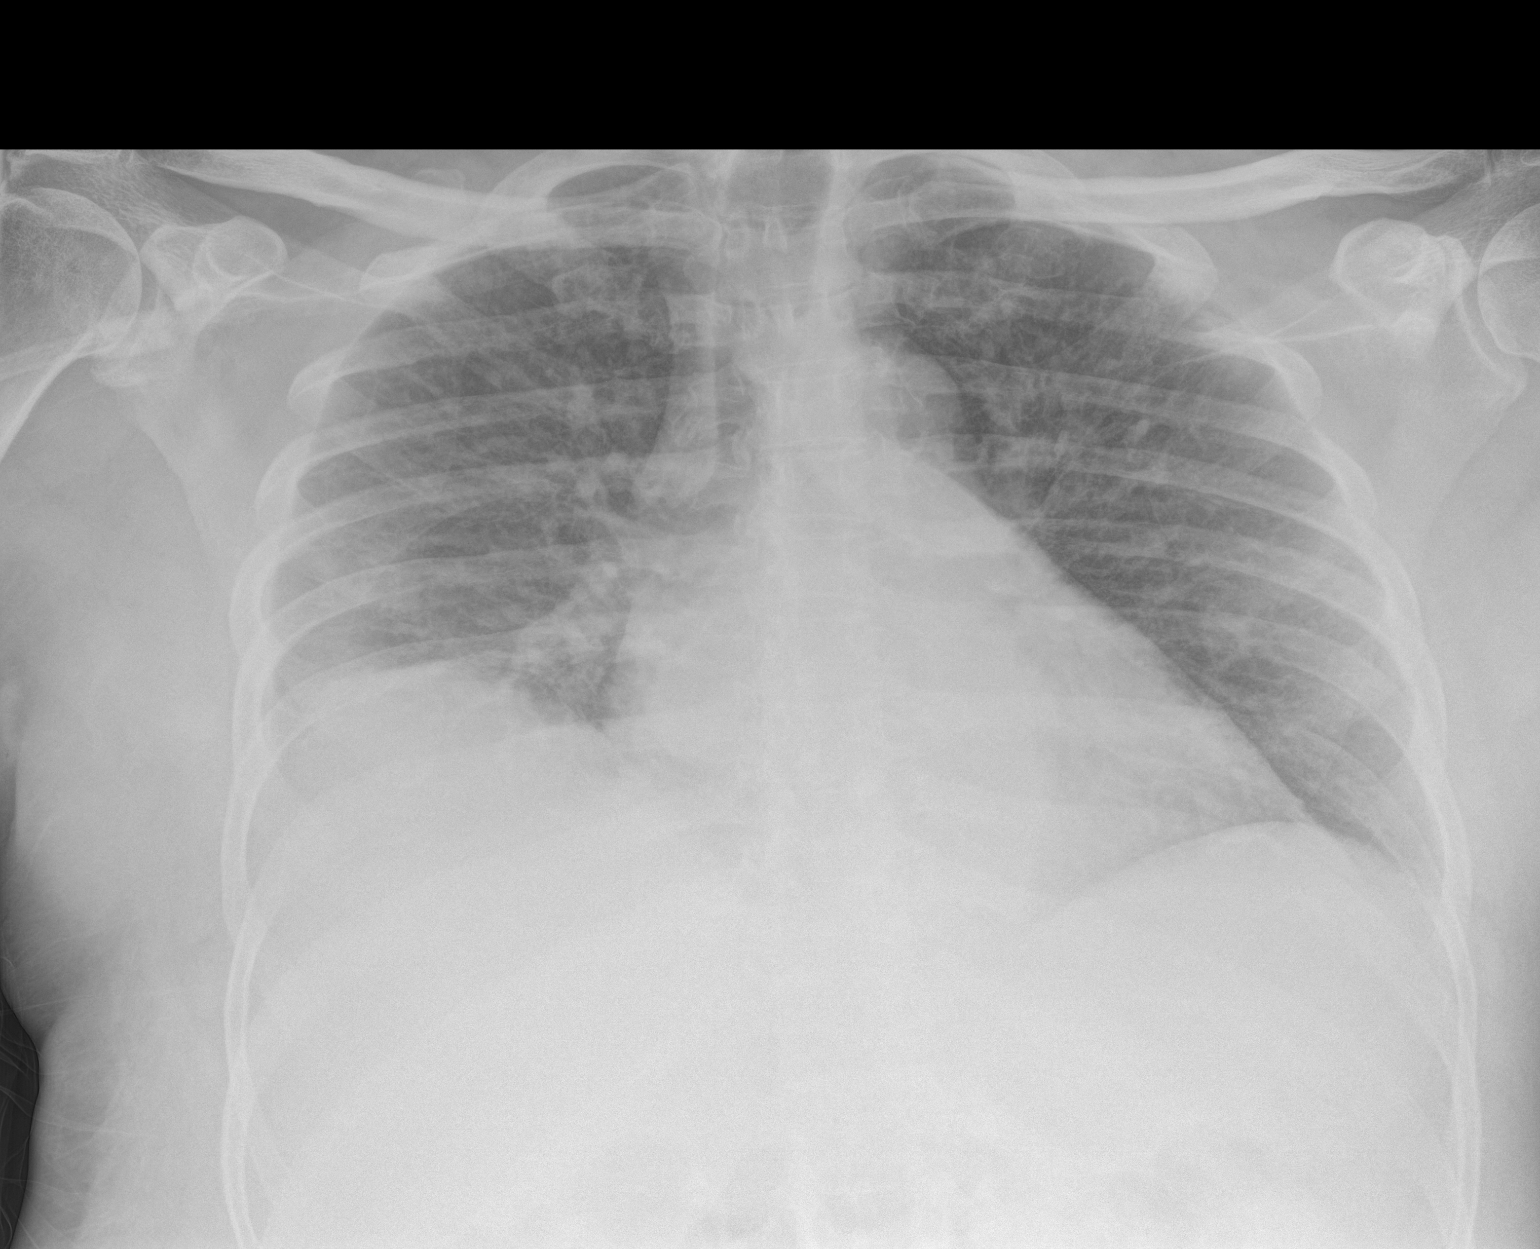

[1 of 1 positions shown; findings below may reference images not displayed]

FINDINGS: The lungs are hypoexpanded. Vascular crowding and vascular
congestion are seen. Mild right basilar airspace opacity could
reflect mild pneumonia or possibly minimal interstitial edema. No
pleural effusion or pneumothorax is seen.

The cardiomediastinal silhouette is borderline enlarged. No acute
osseous abnormalities are identified.
IMPRESSION: Lungs hypoexpanded. Vascular congestion and borderline cardiomegaly.
Mild right basilar airspace opacity could reflect mild pneumonia or
possibly minimal interstitial edema.

## 2016-08-21 ENCOUNTER — Inpatient Hospital Stay (HOSPITAL_COMMUNITY)
Admission: EM | Admit: 2016-08-21 | Discharge: 2016-08-25 | DRG: 189 | Disposition: A | Discharge status: Skilled Nursing Facility | Payer: Medicare Other | Attending: Internal Medicine | Admitting: Internal Medicine

## 2016-08-21 ENCOUNTER — Emergency Department (HOSPITAL_COMMUNITY): Payer: Medicare Other

## 2016-08-21 ENCOUNTER — Encounter (HOSPITAL_COMMUNITY): Payer: Self-pay | Admitting: *Deleted

## 2016-08-21 ENCOUNTER — Other Ambulatory Visit (HOSPITAL_COMMUNITY): Payer: Self-pay | Admitting: Internal Medicine

## 2016-08-21 DIAGNOSIS — R591 Generalized enlarged lymph nodes: Secondary | ICD-10-CM | POA: Diagnosis present

## 2016-08-21 DIAGNOSIS — I878 Other specified disorders of veins: Secondary | ICD-10-CM | POA: Diagnosis present

## 2016-08-21 DIAGNOSIS — I110 Hypertensive heart disease with heart failure: Secondary | ICD-10-CM | POA: Diagnosis present

## 2016-08-21 DIAGNOSIS — Z833 Family history of diabetes mellitus: Secondary | ICD-10-CM

## 2016-08-21 DIAGNOSIS — Z79899 Other long term (current) drug therapy: Secondary | ICD-10-CM

## 2016-08-21 DIAGNOSIS — G8929 Other chronic pain: Secondary | ICD-10-CM | POA: Diagnosis present

## 2016-08-21 DIAGNOSIS — D563 Thalassemia minor: Secondary | ICD-10-CM | POA: Diagnosis present

## 2016-08-21 DIAGNOSIS — D649 Anemia, unspecified: Secondary | ICD-10-CM | POA: Diagnosis present

## 2016-08-21 DIAGNOSIS — F17210 Nicotine dependence, cigarettes, uncomplicated: Secondary | ICD-10-CM | POA: Diagnosis present

## 2016-08-21 DIAGNOSIS — E440 Moderate protein-calorie malnutrition: Secondary | ICD-10-CM | POA: Diagnosis present

## 2016-08-21 DIAGNOSIS — E669 Obesity, unspecified: Secondary | ICD-10-CM | POA: Diagnosis present

## 2016-08-21 DIAGNOSIS — K219 Gastro-esophageal reflux disease without esophagitis: Secondary | ICD-10-CM | POA: Diagnosis present

## 2016-08-21 DIAGNOSIS — I83009 Varicose veins of unspecified lower extremity with ulcer of unspecified site: Secondary | ICD-10-CM | POA: Diagnosis present

## 2016-08-21 DIAGNOSIS — E119 Type 2 diabetes mellitus without complications: Secondary | ICD-10-CM | POA: Diagnosis present

## 2016-08-21 DIAGNOSIS — I5031 Acute diastolic (congestive) heart failure: Secondary | ICD-10-CM | POA: Diagnosis present

## 2016-08-21 DIAGNOSIS — I776 Arteritis, unspecified: Secondary | ICD-10-CM | POA: Diagnosis present

## 2016-08-21 DIAGNOSIS — E785 Hyperlipidemia, unspecified: Secondary | ICD-10-CM | POA: Diagnosis present

## 2016-08-21 DIAGNOSIS — J189 Pneumonia, unspecified organism: Secondary | ICD-10-CM | POA: Diagnosis present

## 2016-08-21 DIAGNOSIS — R0902 Hypoxemia: Secondary | ICD-10-CM | POA: Insufficient documentation

## 2016-08-21 DIAGNOSIS — Y95 Nosocomial condition: Secondary | ICD-10-CM | POA: Diagnosis present

## 2016-08-21 DIAGNOSIS — I87303 Chronic venous hypertension (idiopathic) without complications of bilateral lower extremity: Secondary | ICD-10-CM | POA: Diagnosis not present

## 2016-08-21 DIAGNOSIS — E1351 Other specified diabetes mellitus with diabetic peripheral angiopathy without gangrene: Secondary | ICD-10-CM | POA: Diagnosis present

## 2016-08-21 DIAGNOSIS — E874 Mixed disorder of acid-base balance: Secondary | ICD-10-CM

## 2016-08-21 DIAGNOSIS — Z882 Allergy status to sulfonamides status: Secondary | ICD-10-CM

## 2016-08-21 DIAGNOSIS — J9601 Acute respiratory failure with hypoxia: Secondary | ICD-10-CM | POA: Diagnosis present

## 2016-08-21 DIAGNOSIS — G4730 Sleep apnea, unspecified: Secondary | ICD-10-CM | POA: Diagnosis present

## 2016-08-21 DIAGNOSIS — Z79891 Long term (current) use of opiate analgesic: Secondary | ICD-10-CM

## 2016-08-21 DIAGNOSIS — I872 Venous insufficiency (chronic) (peripheral): Secondary | ICD-10-CM | POA: Diagnosis present

## 2016-08-21 DIAGNOSIS — E1151 Type 2 diabetes mellitus with diabetic peripheral angiopathy without gangrene: Secondary | ICD-10-CM | POA: Diagnosis present

## 2016-08-21 DIAGNOSIS — M25559 Pain in unspecified hip: Secondary | ICD-10-CM | POA: Diagnosis present

## 2016-08-21 DIAGNOSIS — R599 Enlarged lymph nodes, unspecified: Secondary | ICD-10-CM | POA: Diagnosis not present

## 2016-08-21 DIAGNOSIS — E1165 Type 2 diabetes mellitus with hyperglycemia: Secondary | ICD-10-CM | POA: Diagnosis present

## 2016-08-21 DIAGNOSIS — E1365 Other specified diabetes mellitus with hyperglycemia: Secondary | ICD-10-CM

## 2016-08-21 DIAGNOSIS — I10 Essential (primary) hypertension: Secondary | ICD-10-CM | POA: Diagnosis present

## 2016-08-21 DIAGNOSIS — M1990 Unspecified osteoarthritis, unspecified site: Secondary | ICD-10-CM | POA: Diagnosis present

## 2016-08-21 DIAGNOSIS — J9602 Acute respiratory failure with hypercapnia: Secondary | ICD-10-CM | POA: Diagnosis not present

## 2016-08-21 DIAGNOSIS — Z6830 Body mass index (BMI) 30.0-30.9, adult: Secondary | ICD-10-CM

## 2016-08-21 DIAGNOSIS — R609 Edema, unspecified: Secondary | ICD-10-CM | POA: Diagnosis present

## 2016-08-21 DIAGNOSIS — Z7901 Long term (current) use of anticoagulants: Secondary | ICD-10-CM

## 2016-08-21 DIAGNOSIS — Z794 Long term (current) use of insulin: Secondary | ICD-10-CM

## 2016-08-21 LAB — CBC WITH DIFFERENTIAL/PLATELET
Basophils Absolute: 0.1 10*3/uL (ref 0.0–0.1)
Basophils Relative: 1 %
EOS PCT: 4 %
Eosinophils Absolute: 0.2 10*3/uL (ref 0.0–0.7)
HEMATOCRIT: 33.4 % — AB (ref 39.0–52.0)
HEMOGLOBIN: 9.7 g/dL — AB (ref 13.0–17.0)
LYMPHS PCT: 29 %
Lymphs Abs: 1.7 10*3/uL (ref 0.7–4.0)
MCH: 18.5 pg — ABNORMAL LOW (ref 26.0–34.0)
MCHC: 29 g/dL — ABNORMAL LOW (ref 30.0–36.0)
MCV: 63.7 fL — AB (ref 78.0–100.0)
MONO ABS: 0.5 10*3/uL (ref 0.1–1.0)
MONOS PCT: 9 %
NEUTROS PCT: 57 %
Neutro Abs: 3.4 10*3/uL (ref 1.7–7.7)
PLATELETS: 239 10*3/uL (ref 150–400)
RBC: 5.24 MIL/uL (ref 4.22–5.81)
RDW: 21.4 % — ABNORMAL HIGH (ref 11.5–15.5)
WBC: 5.9 10*3/uL (ref 4.0–10.5)

## 2016-08-21 LAB — COMPREHENSIVE METABOLIC PANEL
ALT: 16 U/L — ABNORMAL LOW (ref 17–63)
ANION GAP: 5 (ref 5–15)
AST: 19 U/L (ref 15–41)
Albumin: 2.7 g/dL — ABNORMAL LOW (ref 3.5–5.0)
Alkaline Phosphatase: 131 U/L — ABNORMAL HIGH (ref 38–126)
BUN: 30 mg/dL — ABNORMAL HIGH (ref 6–20)
CHLORIDE: 96 mmol/L — AB (ref 101–111)
CO2: 37 mmol/L — AB (ref 22–32)
Calcium: 8.4 mg/dL — ABNORMAL LOW (ref 8.9–10.3)
Creatinine, Ser: 0.94 mg/dL (ref 0.61–1.24)
Glucose, Bld: 233 mg/dL — ABNORMAL HIGH (ref 65–99)
POTASSIUM: 3.9 mmol/L (ref 3.5–5.1)
SODIUM: 138 mmol/L (ref 135–145)
Total Bilirubin: 0.5 mg/dL (ref 0.3–1.2)
Total Protein: 7.9 g/dL (ref 6.5–8.1)

## 2016-08-21 LAB — BLOOD GAS, ARTERIAL
ACID-BASE EXCESS: 11.7 mmol/L — AB (ref 0.0–2.0)
Bicarbonate: 38.6 mmol/L — ABNORMAL HIGH (ref 20.0–28.0)
DRAWN BY: 331471
O2 Content: 2 L/min
O2 Saturation: 90.9 %
PH ART: 7.375 (ref 7.350–7.450)
Patient temperature: 98.6
pCO2 arterial: 67.6 mmHg (ref 32.0–48.0)
pO2, Arterial: 67.6 mmHg — ABNORMAL LOW (ref 83.0–108.0)

## 2016-08-21 LAB — MRSA PCR SCREENING: MRSA BY PCR: POSITIVE — AB

## 2016-08-21 LAB — BRAIN NATRIURETIC PEPTIDE: B NATRIURETIC PEPTIDE 5: 23.9 pg/mL (ref 0.0–100.0)

## 2016-08-21 LAB — GLUCOSE, CAPILLARY: Glucose-Capillary: 207 mg/dL — ABNORMAL HIGH (ref 65–99)

## 2016-08-21 LAB — TROPONIN I

## 2016-08-21 MED ORDER — VANCOMYCIN HCL IN DEXTROSE 750-5 MG/150ML-% IV SOLN
750.0000 mg | Freq: Two times a day (BID) | INTRAVENOUS | Status: DC
  Administered 2016-08-22 – 2016-08-23 (×2): 750 mg via INTRAVENOUS
  Filled 2016-08-21 (×3): qty 150

## 2016-08-21 MED ORDER — PREGABALIN 75 MG PO CAPS
150.0000 mg | ORAL_CAPSULE | Freq: Two times a day (BID) | ORAL | Status: DC
  Administered 2016-08-21 – 2016-08-25 (×8): 150 mg via ORAL
  Filled 2016-08-21 (×8): qty 2

## 2016-08-21 MED ORDER — CARVEDILOL 3.125 MG PO TABS
3.1250 mg | ORAL_TABLET | Freq: Two times a day (BID) | ORAL | Status: DC
  Administered 2016-08-22 – 2016-08-25 (×7): 3.125 mg via ORAL
  Filled 2016-08-21 (×7): qty 1

## 2016-08-21 MED ORDER — CEFEPIME HCL 1 G IJ SOLR
1.0000 g | Freq: Three times a day (TID) | INTRAMUSCULAR | Status: DC
  Administered 2016-08-21 – 2016-08-23 (×5): 1 g via INTRAVENOUS
  Filled 2016-08-21 (×6): qty 1

## 2016-08-21 MED ORDER — LISINOPRIL 2.5 MG PO TABS
2.5000 mg | ORAL_TABLET | Freq: Every day | ORAL | Status: DC
Start: 2016-08-22 — End: 2016-08-25
  Administered 2016-08-22 – 2016-08-25 (×4): 2.5 mg via ORAL
  Filled 2016-08-21 (×4): qty 1

## 2016-08-21 MED ORDER — INSULIN ASPART 100 UNIT/ML ~~LOC~~ SOLN
0.0000 [IU] | Freq: Three times a day (TID) | SUBCUTANEOUS | Status: DC
  Administered 2016-08-22 (×2): 5 [IU] via SUBCUTANEOUS
  Administered 2016-08-23: 11 [IU] via SUBCUTANEOUS
  Administered 2016-08-23: 8 [IU] via SUBCUTANEOUS

## 2016-08-21 MED ORDER — SODIUM CHLORIDE 0.9 % IJ SOLN
INTRAMUSCULAR | Status: AC
  Filled 2016-08-21: qty 50

## 2016-08-21 MED ORDER — IOPAMIDOL (ISOVUE-370) INJECTION 76%
INTRAVENOUS | Status: AC
  Filled 2016-08-21: qty 100

## 2016-08-21 MED ORDER — TAMSULOSIN HCL 0.4 MG PO CAPS
0.4000 mg | ORAL_CAPSULE | Freq: Every day | ORAL | Status: DC
  Administered 2016-08-22 – 2016-08-24 (×4): 0.4 mg via ORAL
  Filled 2016-08-21 (×4): qty 1

## 2016-08-21 MED ORDER — ATORVASTATIN CALCIUM 40 MG PO TABS
80.0000 mg | ORAL_TABLET | Freq: Every day | ORAL | Status: DC
  Administered 2016-08-21 – 2016-08-25 (×5): 80 mg via ORAL
  Filled 2016-08-21 (×5): qty 2

## 2016-08-21 MED ORDER — FERROUS SULFATE 325 (65 FE) MG PO TABS
325.0000 mg | ORAL_TABLET | Freq: Three times a day (TID) | ORAL | Status: DC
  Administered 2016-08-22 – 2016-08-25 (×10): 325 mg via ORAL
  Filled 2016-08-21 (×10): qty 1

## 2016-08-21 MED ORDER — ENOXAPARIN SODIUM 40 MG/0.4ML ~~LOC~~ SOLN
40.0000 mg | SUBCUTANEOUS | Status: DC
  Administered 2016-08-21 – 2016-08-24 (×4): 40 mg via SUBCUTANEOUS
  Filled 2016-08-21 (×4): qty 0.4

## 2016-08-21 MED ORDER — OXYCODONE HCL ER 10 MG PO T12A
10.0000 mg | EXTENDED_RELEASE_TABLET | Freq: Two times a day (BID) | ORAL | Status: DC
  Administered 2016-08-21 – 2016-08-25 (×8): 10 mg via ORAL
  Filled 2016-08-21 (×8): qty 1

## 2016-08-21 MED ORDER — MIRTAZAPINE 15 MG PO TABS
15.0000 mg | ORAL_TABLET | Freq: Every day | ORAL | Status: DC
  Administered 2016-08-21 – 2016-08-24 (×4): 15 mg via ORAL
  Filled 2016-08-21 (×4): qty 1

## 2016-08-21 MED ORDER — LACTATED RINGERS IV SOLN
INTRAVENOUS | Status: DC
  Administered 2016-08-21 – 2016-08-22 (×2): via INTRAVENOUS

## 2016-08-21 MED ORDER — ASPIRIN EC 81 MG PO TBEC
81.0000 mg | DELAYED_RELEASE_TABLET | Freq: Every day | ORAL | Status: DC
  Administered 2016-08-22 – 2016-08-25 (×4): 81 mg via ORAL
  Filled 2016-08-21 (×4): qty 1

## 2016-08-21 MED ORDER — IOPAMIDOL (ISOVUE-370) INJECTION 76%
100.0000 mL | Freq: Once | INTRAVENOUS | Status: AC | PRN
  Administered 2016-08-21: 100 mL via INTRAVENOUS

## 2016-08-21 MED ORDER — VANCOMYCIN HCL 10 G IV SOLR
2000.0000 mg | Freq: Once | INTRAVENOUS | Status: AC
  Administered 2016-08-21: 2000 mg via INTRAVENOUS
  Filled 2016-08-21: qty 2000

## 2016-08-21 MED ORDER — OXYCODONE HCL 5 MG PO TABS
10.0000 mg | ORAL_TABLET | Freq: Three times a day (TID) | ORAL | Status: DC
  Administered 2016-08-21 – 2016-08-25 (×11): 10 mg via ORAL
  Filled 2016-08-21 (×11): qty 2

## 2016-08-21 MED ORDER — PANTOPRAZOLE SODIUM 40 MG PO TBEC
40.0000 mg | DELAYED_RELEASE_TABLET | Freq: Two times a day (BID) | ORAL | Status: DC
  Administered 2016-08-21 – 2016-08-25 (×8): 40 mg via ORAL
  Filled 2016-08-21 (×8): qty 1

## 2016-08-21 MED ORDER — INSULIN GLARGINE 100 UNIT/ML ~~LOC~~ SOLN
20.0000 [IU] | Freq: Every day | SUBCUTANEOUS | Status: DC
  Administered 2016-08-21 – 2016-08-22 (×2): 20 [IU] via SUBCUTANEOUS
  Filled 2016-08-21 (×3): qty 0.2

## 2016-08-21 MED ORDER — TIZANIDINE HCL 4 MG PO TABS
4.0000 mg | ORAL_TABLET | Freq: Three times a day (TID) | ORAL | Status: DC | PRN
  Administered 2016-08-22 – 2016-08-24 (×2): 4 mg via ORAL
  Filled 2016-08-21 (×2): qty 1

## 2016-08-21 MED ORDER — OXYBUTYNIN CHLORIDE ER 5 MG PO TB24
10.0000 mg | ORAL_TABLET | Freq: Every day | ORAL | Status: DC
  Administered 2016-08-21 – 2016-08-24 (×4): 10 mg via ORAL
  Filled 2016-08-21 (×4): qty 2

## 2016-08-21 NOTE — ED Triage Notes (Signed)
Per EMS - patient from [facility] with c/o low oxygen saturation since yesterday and recent treatment for PNA (finished abx 3 days ago).  Patient denies feeling SOB or other concerns.  136/70, HR 86, 94% on 2 L O2 .

## 2016-08-21 NOTE — Progress Notes (Signed)
Pharmacy Antibiotic Note  [name] is a 60 y.o. male admitted on 08/21/2016 with pneumonia.  Pharmacy has been consulted for vancomycin dosing.  Plan:  Vancomycin 2g IV x 1, then 750mg  IV q12h  Check trough at steady state, goal 15-20 mcg/ml  Cefepime 1g IV q8h per MD  Follow up renal function & cultures, de-escalate as appropriate  Height: 6\' 4"  (193 cm) Weight: 250 lb (113.4 kg) IBW/kg (Calculated) : 86.8  Temp (24hrs), Avg:98.7 F (37.1 C), Min:98.6 F (37 C), Max:98.7 F (37.1 C)   Recent Labs Lab 08/21/16 1604  WBC 5.9  CREATININE 0.94    Estimated Creatinine Clearance: 112.3 mL/min (by C-G formula based on SCr of 0.94 mg/dL).    Allergies  Allergen Reactions  . Other     Pt is a Jehov[employer] Witness. No blood products.  . Sulfa Antibiotics     Causes flu-like symptom : sweating, chills, fever, body aches    Antimicrobials this admission:  11/29 Vanc >> 11/29 Cefepime >>  Dose adjustments this admission:  ---  Microbiology results:  11/29 BCx: sent 11/29 MRSA PCR: sent  Thank you for allowing pharmacy to be a part of this patient's care.  Peggyann Juba, PharmD, BCPS Pager: [phone] 08/21/2016 9:59 PM

## 2016-08-21 NOTE — ED Notes (Signed)
Bed: WA08 Expected date:  Expected time:  Means of arrival:  Comments: EMS hypoxia

## 2016-08-21 NOTE — ED Provider Notes (Signed)
[place] DEPT Provider Note   CSN: [id number] Arrival date & time: 08/21/16  1522     History   Chief Complaint Chief Complaint  Patient presents with  . Hypoxia    HPI [name] is a 60 y.o. male.  HPI Patient presents with hypoxia. Reportedly has been hypoxic for 2 weeks at the nursing home. Had an x-ray done previously and reportedly showed pneumonia. He's been treated with antibiotics. Apparently was more hypoxic on oxygen and sent in for CT PE. Reportedly had an echocardiogram done today but unknown results. Had also been started on Lasix and potassium. No chest pain. Occasional cough without production. Has chronic swelling of both his legs.   Past Medical History:  Diagnosis Date  . Anemia   . Arthritis    "left hip" (04/26/2015)  . Cellulitis and abscess of leg hositalized 04/25/2015   left  . Chronic hip pain   . Depression   . GERD (gastroesophageal reflux disease)   . Hypercholesterolemia   . Hypertension   . Pneumonia 1960's X 1  . Sleep apnea    "couldn't wear the mask" (04/26/2015)  . Thalassemia minor   . Type II diabetes mellitus [facility])     Patient Active Problem List   Diagnosis Date Noted  . Hypoxia 08/21/2016  . AKI (acute kidney injury) ([place]) 11/02/2015  . Hyperkalemia 11/02/2015  . Hypoglycemia 11/02/2015  . Cellulitis   . Leg ulcer ([place])   . Nonhealing ulcer of left lower extremity ([place])   . Pain of left leg   . Cellulitis of leg 04/25/2015  . Degenerative arthritis of hip 04/25/2015  . Cellulitis and abscess of leg   . Microcytic anemia 03/27/2015  . Hypertension   . Edema   . Fever   . DM (diabetes mellitus), secondary, uncontrolled, with peripheral vascular complications ([place]) 0000000  . MSSA (methicillin susceptible Staphylococcus aureus) infection 03/20/2015  . Leukocytosis 03/20/2015  . Anemia, iron deficiency 03/20/2015  . Cellulitis of left lower extremity   . Pyomyositis   . Sepsis ([place]) 03/14/2015    Past Surgical  History:  Procedure Laterality Date  . INGUINAL HERNIA REPAIR Right ~ 2004  . SHOULDER ARTHROSCOPY W/ ROTATOR CUFF REPAIR Right ~ 2004  . TOENAIL EXCISION Right 1980's X 2   "big toe"       Home Medications    Prior to Admission medications   Medication Sig Start Date End Date Taking? Authorizing Provider  acetaminophen (TYLENOL) 325 MG tablet Take 2 tablets (650 mg total) by mouth every 6 (six) hours as needed for mild pain (or Fever >/= 101). 03/23/15  Yes Robbie Lis, MD  Amino Acids-Protein Hydrolys (FEEDING SUPPLEMENT, PRO-STAT SUGAR FREE 64,) LIQD Take 30 mLs by mouth 3 (three) times daily with meals.   Yes Historical Provider, MD  aspirin EC 81 MG tablet Take 81 mg by mouth every morning.   Yes Historical Provider, MD  atorvastatin (LIPITOR) 80 MG tablet Take 80 mg by mouth daily.   Yes Historical Provider, MD  carvedilol (COREG) 3.125 MG tablet Take 1 tablet (3.125 mg total) by mouth 2 (two) times daily with a meal. 11/13/15  Yes Thurnell Lose, MD  ferrous sulfate 325 (65 FE) MG tablet Take 325 mg by mouth 3 (three) times daily with meals.   Yes Historical Provider, MD  insulin aspart (NOVOLOG) 100 UNIT/ML injection Before each meal 3 times a day, 140-199 - 2 units, 200-250 - 4 units, 251-299 - 6 units,  300-349 - 8 units,  350 or above 10 units. Dispense syringes and needles as needed, Ok to switch to PEN if approved. Substitute to any brand approved. DX DM2, Code E11.65 11/13/15  Yes Thurnell Lose, MD  insulin aspart protamine- aspart (NOVOLOG MIX 70/30) (70-30) 100 UNIT/ML injection Inject 0.25 mLs (25 Units total) into the skin 2 (two) times daily with a meal. Patient taking differently: Inject 45 Units into the skin 2 (two) times daily with a meal.  04/04/15  Yes Donne Hazel, MD  lidocaine (XYLOCAINE) 2 % solution as directed. Use solution over affected area 3 times a week with dressings changes.   Yes Historical Provider, MD  lisinopril (PRINIVIL,ZESTRIL) 2.5 MG tablet  Take 1 tablet (2.5 mg total) by mouth daily. 11/13/15  Yes Thurnell Lose, MD  LYRICA 150 MG capsule Take 150 mg by mouth 2 (two) times daily. 08/18/16  Yes Historical Provider, MD  mirtazapine (REMERON) 15 MG tablet Take 15 mg by mouth at bedtime.   Yes Historical Provider, MD  Multiple Vitamin (MULTIVITAMIN WITH MINERALS) TABS tablet Take 1 tablet by mouth daily. 03/23/15  Yes Robbie Lis, MD  oxybutynin (DITROPAN-XL) 10 MG 24 hr tablet Take 10 mg by mouth at bedtime.   Yes Historical Provider, MD  oxyCODONE (OXYCONTIN) 10 mg 12 hr tablet Take 1 tablet (10 mg total) by mouth every 12 (twelve) hours. 11/13/15  Yes Thurnell Lose, MD  Oxycodone HCl 10 MG TABS Take 10 mg by mouth 3 (three) times daily.   Yes Historical Provider, MD  pantoprazole (PROTONIX) 40 MG tablet Take 1 tablet (40 mg total) by mouth 2 (two) times daily. 11/13/15  Yes Thurnell Lose, MD  tamsulosin (FLOMAX) 0.4 MG CAPS capsule Take 0.4 mg by mouth daily after supper.   Yes Historical Provider, MD  tiZANidine (ZANAFLEX) 4 MG tablet Take 4 mg by mouth every 8 (eight) hours as needed for muscle spasms.   Yes Historical Provider, MD  docusate sodium (COLACE) 100 MG capsule Take 1 capsule (100 mg total) by mouth 2 (two) times daily as needed for mild constipation. Patient not taking: Reported on 08/21/2016 03/23/15   Robbie Lis, MD  enoxaparin (LOVENOX) 150 MG/ML injection Inject 0.36 mLs (55 mg total) into the skin daily. Patient not taking: Reported on 08/21/2016 03/23/15   Robbie Lis, MD  famotidine (PEPCID) 20 MG tablet Take 1 tablet (20 mg total) by mouth 2 (two) times daily. Patient not taking: Reported on 08/21/2016 03/23/15   Robbie Lis, MD  insulin aspart (NOVOLOG) 100 UNIT/ML injection Inject 0-9 Units into the skin 3 (three) times daily with meals. Patient not taking: Reported on 08/21/2016 03/23/15   Robbie Lis, MD  ondansetron (ZOFRAN) 4 MG tablet Take 1 tablet (4 mg total) by mouth every 6 (six) hours as  needed for nausea. Patient not taking: Reported on 08/21/2016 03/23/15   Robbie Lis, MD  oxyCODONE (OXY IR/ROXICODONE) 5 MG immediate release tablet Take 1 tablet (5 mg total) by mouth every 4 (four) hours as needed for severe pain. Patient not taking: Reported on 08/21/2016 11/13/15   Thurnell Lose, MD  polyethylene glycol (MIRALAX / Floria Raveling) packet Take 17 g by mouth daily as needed for mild constipation. Patient not taking: Reported on 08/21/2016 04/04/15   Donne Hazel, MD    Family History Family History  Problem Relation Age of Onset  . Diabetes Mellitus II Mother   . Diabetes Mellitus  II Father   . Diabetes Mellitus II Brother     Social History Social History  Substance Use Topics  . Smoking status: Current Every Day Smoker    Packs/day: 0.12    Years: 45.00    Types: Cigarettes  . Smokeless tobacco: Never Used  . Alcohol use Yes     Comment: 04/26/2015 "I may drink 1/2 glass of wine or a couple beers 1-2 times/month; if that"     Allergies   Other and Sulfa antibiotics   Review of Systems Review of Systems  Constitutional: Negative for appetite change.  HENT: Negative for congestion.   Respiratory: Positive for cough. Negative for chest tightness and shortness of breath.   Cardiovascular: Positive for leg swelling. Negative for chest pain.  Gastrointestinal: Negative for abdominal pain.  Genitourinary: Negative for dysuria.  Musculoskeletal: Negative for back pain.  Neurological: Negative for light-headedness.  Hematological: Negative for adenopathy.  Psychiatric/Behavioral: Negative for confusion.     Physical Exam Updated Vital Signs BP 148/61 (BP Location: Left Arm)   Pulse 66   Temp 98.7 F (37.1 C) (Oral)   Resp 12   SpO2 93%   Physical Exam  Constitutional: He appears well-developed.  HENT:  Head: Atraumatic.  Neck: Neck supple. No JVD present.  Cardiovascular: Normal rate.   Pulmonary/Chest: Effort normal. No respiratory distress. He  has no wheezes.  Abdominal: Soft.  Musculoskeletal: He exhibits edema.  Chronic edema bilateral lower legs.  Neurological: He is alert.  Skin: Skin is warm.     ED Treatments / Results  Labs (all labs ordered are listed, but only abnormal results are displayed) Labs Reviewed  COMPREHENSIVE METABOLIC PANEL - Abnormal; Notable for the following:       Result Value   Chloride 96 (*)    CO2 37 (*)    Glucose, Bld 233 (*)    BUN 30 (*)    Calcium 8.4 (*)    Albumin 2.7 (*)    ALT 16 (*)    Alkaline Phosphatase 131 (*)    All other components within normal limits  CBC WITH DIFFERENTIAL/PLATELET - Abnormal; Notable for the following:    Hemoglobin 9.7 (*)    HCT 33.4 (*)    MCV 63.7 (*)    MCH 18.5 (*)    MCHC 29.0 (*)    RDW 21.4 (*)    All other components within normal limits  BLOOD GAS, ARTERIAL - Abnormal; Notable for the following:    pCO2 arterial 67.6 (*)    pO2, Arterial 67.6 (*)    Bicarbonate 38.6 (*)    Acid-Base Excess 11.7 (*)    All other components within normal limits  TROPONIN I  BRAIN NATRIURETIC PEPTIDE    EKG  EKG Interpretation  Date/Time:  Wednesday August 21 2016 15:54:42 EST Ventricular Rate:  78 PR Interval:    QRS Duration: 86 QT Interval:  393 QTC Calculation: 448 R Axis:   48 Text Interpretation:  Sinus rhythm Confirmed by Alvino Chapel  MD, Umaima Scholten [phone]) on 08/21/2016 4:44:59 PM       Radiology Ct Angio Chest Pe W And/or Wo Contrast  Addendum Date: 08/21/2016   ADDENDUM REPORT: 08/21/2016 17:51 ADDENDUM: Mild calcific atherosclerosis of the aortic arch. Electronically Signed   By: Elon Alas M.D.   On: 08/21/2016 17:51   Result Date: 08/21/2016 CLINICAL DATA:  Hypoxia beginning yesterday, finished antibiotics for pneumonia 3 days ago. History of hypertension, hyperlipidemia, diabetes. EXAM: CT ANGIOGRAPHY CHEST WITH CONTRAST TECHNIQUE:  Multidetector CT imaging of the chest was performed using the standard protocol during  bolus administration of intravenous contrast. Multiplanar CT image reconstructions and MIPs were obtained to evaluate the vascular anatomy. CONTRAST:  100 cc Isovue 370 COMPARISON:  Chest radiograph November 02, 2015 and CT chest March 17, 2015 FINDINGS: CARDIOVASCULAR: Suboptimal contrast opacification of the pulmonary artery's (175 Hounsfield units, target is 250 Hounsfield units. Main pulmonary artery is not enlarged. No pulmonary arterial filling defects to the level of the segmental branches. Heart size is mildly enlarged, no right heart strain. Mild coronary artery calcifications. No pericardial effusions. Thoracic aorta is normal course and caliber, unremarkable. MEDIASTINUM/NODES: 12 mm short access pre tracheal lymph node, 13 mm short access carinal lymph node. 12 mm RIGHT hilar lymph node. Additional smaller aortopulmonary window and pretracheal lymph nodes. LUNGS/PLEURA: Tracheobronchial tree is patent, no pneumothorax. Tracheal bronchial wall thickening. Persistently elevated RIGHT hemidiaphragm with mild hypo enhancing consolidation RIGHT lower lobe. Bibasilar dependent atelectasis. No pleural effusion. Interlobular septal thickening. UPPER ABDOMEN: Included view of the abdomen is nonacute. Adrenal glands non completely imaged. MUSCULOSKELETAL: Prominent though not pathologically enlarged axillary lymph nodes are likely reactive. Review of the MIP images confirms the above findings. IMPRESSION: No acute pulmonary embolism. Mild cardiomegaly and findings of pulmonary edema without pleural effusion. RIGHT lower lobe atelectasis, and/or pneumonia. Worsening mediastinal lymphadenopathy, again this may be reactive though short-term follow-up is recommended and/or PET-CT. Bronchial wall thickening can be seen with bronchitis/reactive airway disease and pulmonary edema. Electronically Signed: By: Elon Alas M.D. On: 08/21/2016 17:42    Procedures Procedures (including critical care  time)  Medications Ordered in ED Medications  sodium chloride 0.9 % injection (not administered)  iopamidol (ISOVUE-370) 76 % injection (not administered)  iopamidol (ISOVUE-370) 76 % injection 100 mL (100 mLs Intravenous Contrast Given 08/21/16 1706)     Initial Impression / Assessment and Plan / ED Course  I have reviewed the triage vital signs and the nursing notes.  Pertinent labs & imaging results that were available during my care of the patient were reviewed by me and considered in my medical decision making (see chart for details).  Clinical Course     Patient with hypoxia. CT scan did not show PE but showed possible pulmonary edema. He does however have a normal BNP. Has been on antibiotics for pneumonia.   CT scan showed atelectasis versus pneumonia. He has not had fever or much of a cough. Discuss with pulmonary and will start BiPAP and initiate diuresis. Admit to stepdown. Final Clinical Impressions(s) / ED Diagnoses   Final diagnoses:  Hypoxia    New Prescriptions New Prescriptions   No medications on file     Davonna Belling, MD 08/21/16 1846

## 2016-08-21 NOTE — H&P (Signed)
History and Physical    [name] HQI:[id number] DOB: 1956/05/02 DOA: 08/21/2016  PCP: Charlsie Merles, MD - [place] [facility]:  Polite Patient coming from: [facility] and [facility], has been there for 15 months; NOK: daughter  Chief Complaint: hypoxia  HPI: [name] is a 60 y.o. male with medical history significant of DM, HTN, chronic LE venous stasis with ulcers, and admission in 2/17 for anemia and sepsis presenting with hypoxia.  "I didn't ask to come in here, I was sent here."  He reports that Dr. Delfina Redwood was concerned and sent him here because his O2 as low.  For the last few days at the center, O2 has been very low, 81-92%.  Has been on oxygen and it would go back up to 92-93%, even as high as 96.  Last week, CXR diagnosed PNA and CHF.   Given antibiotics for 7 days, has been off for 2-3 days.  This afternoon, he was given an EKG and echocardiogram.  About 45 minutes later, they called 911.  He was eating a late lunch and didn't even know he was being sent to the hospital.  A nurse has given him this information, he has not discussed it with a doctor at all; he is quite frustrated about this.   No cough.  No SOB except with significant exertion (which he does not do often).  Just hypoxic.  +tobacco - 1 pack per week.  No fevers since treated for PNA.  Eating/drinking ok, +weight gain from eating (not fluid retention).    ED Course: Per Dr. Alvino Chapel: Patient with hypoxia. CT scan did not show PE but showed possible pulmonary edema. He does however have a normal BNP. Has been on antibiotics for pneumonia.  CT scan showed atelectasis versus pneumonia. He has not had fever or much of a cough. Discuss with pulmonary and will start BiPAP and initiate diuresis. Admit to stepdown.   Review of Systems: As per HPI; otherwise 10 point review of systems reviewed and negative.   Ambulatory Status:  Minimally ambulatory  Past Medical History:  Diagnosis Date  . Anemia    . Arthritis    "left hip" (04/26/2015)  . Cellulitis and abscess of leg hositalized 04/25/2015   left  . Chronic hip pain   . Depression   . GERD (gastroesophageal reflux disease)   . Hypercholesterolemia   . Hypertension   . Pneumonia 1960's X 1  . Sleep apnea    "couldn't wear the mask" (04/26/2015)  . Thalassemia minor   . Type II diabetes mellitus ([place])     Past Surgical History:  Procedure Laterality Date  . INGUINAL HERNIA REPAIR Right ~ 2004  . SHOULDER ARTHROSCOPY W/ ROTATOR CUFF REPAIR Right ~ 2004  . TOENAIL EXCISION Right 1980's X 2   "big toe"    Social History   Social History  . Marital status: Married    Spouse name: N/A  . Number of children: N/A  . Years of education: N/A   Occupational History  . Not on file.   Social History Main Topics  . Smoking status: Current Every Day Smoker    Packs/day: 0.12    Years: 45.00    Types: Cigarettes  . Smokeless tobacco: Never Used  . Alcohol use Yes     Comment: 04/26/2015 "I may drink 1/2 glass of wine or a couple beers 1-2 times/month; if that"  . Drug use:     Types: Cocaine, Marijuana  Comment: "stopped all drug use in 2005; S/P SARP program in [place]"  . Sexual activity: No   Other Topics Concern  . Not on file   Social History Narrative  . No narrative on file    Allergies  Allergen Reactions  . Other     Pt is a Jehovah Witness. No blood products.  . Sulfa Antibiotics     Causes flu-like symptom : sweating, chills, fever, body aches    Family History  Problem Relation Age of Onset  . Diabetes Mellitus II Mother   . Diabetes Mellitus II Father   . Diabetes Mellitus II Brother     Prior to Admission medications   Medication Sig Start Date End Date Taking? Authorizing Provider  acetaminophen (TYLENOL) 325 MG tablet Take 2 tablets (650 mg total) by mouth every 6 (six) hours as needed for mild pain (or Fever >/= 101). 03/23/15  Yes Robbie Lis, MD  Amino Acids-Protein Hydrolys (FEEDING  SUPPLEMENT, PRO-STAT SUGAR FREE 64,) LIQD Take 30 mLs by mouth 3 (three) times daily with meals.   Yes Historical Provider, MD  aspirin EC 81 MG tablet Take 81 mg by mouth every morning.   Yes Historical Provider, MD  atorvastatin (LIPITOR) 80 MG tablet Take 80 mg by mouth daily.   Yes Historical Provider, MD  carvedilol (COREG) 3.125 MG tablet Take 1 tablet (3.125 mg total) by mouth 2 (two) times daily with a meal. 11/13/15  Yes Thurnell Lose, MD  ferrous sulfate 325 (65 FE) MG tablet Take 325 mg by mouth 3 (three) times daily with meals.   Yes Historical Provider, MD  insulin aspart (NOVOLOG) 100 UNIT/ML injection Before each meal 3 times a day, 140-199 - 2 units, 200-250 - 4 units, 251-299 - 6 units,  300-349 - 8 units,  350 or above 10 units. Dispense syringes and needles as needed, Ok to switch to PEN if approved. Substitute to any brand approved. DX DM2, Code E11.65 11/13/15  Yes Thurnell Lose, MD  insulin aspart protamine- aspart (NOVOLOG MIX 70/30) (70-30) 100 UNIT/ML injection Inject 0.25 mLs (25 Units total) into the skin 2 (two) times daily with a meal. Patient taking differently: Inject 45 Units into the skin 2 (two) times daily with a meal.  04/04/15  Yes Donne Hazel, MD  lidocaine (XYLOCAINE) 2 % solution as directed. Use solution over affected area 3 times a week with dressings changes.   Yes Historical Provider, MD  lisinopril (PRINIVIL,ZESTRIL) 2.5 MG tablet Take 1 tablet (2.5 mg total) by mouth daily. 11/13/15  Yes Thurnell Lose, MD  LYRICA 150 MG capsule Take 150 mg by mouth 2 (two) times daily. 08/18/16  Yes Historical Provider, MD  mirtazapine (REMERON) 15 MG tablet Take 15 mg by mouth at bedtime.   Yes Historical Provider, MD  Multiple Vitamin (MULTIVITAMIN WITH MINERALS) TABS tablet Take 1 tablet by mouth daily. 03/23/15  Yes Robbie Lis, MD  oxybutynin (DITROPAN-XL) 10 MG 24 hr tablet Take 10 mg by mouth at bedtime.   Yes Historical Provider, MD  oxyCODONE (OXYCONTIN)  10 mg 12 hr tablet Take 1 tablet (10 mg total) by mouth every 12 (twelve) hours. 11/13/15  Yes Thurnell Lose, MD  Oxycodone HCl 10 MG TABS Take 10 mg by mouth 3 (three) times daily.   Yes Historical Provider, MD  pantoprazole (PROTONIX) 40 MG tablet Take 1 tablet (40 mg total) by mouth 2 (two) times daily. 11/13/15  Yes Thurnell Lose,  MD  tamsulosin (FLOMAX) 0.4 MG CAPS capsule Take 0.4 mg by mouth daily after supper.   Yes Historical Provider, MD  tiZANidine (ZANAFLEX) 4 MG tablet Take 4 mg by mouth every 8 (eight) hours as needed for muscle spasms.   Yes Historical Provider, MD  docusate sodium (COLACE) 100 MG capsule Take 1 capsule (100 mg total) by mouth 2 (two) times daily as needed for mild constipation. Patient not taking: Reported on 08/21/2016 03/23/15   Robbie Lis, MD  enoxaparin (LOVENOX) 150 MG/ML injection Inject 0.36 mLs (55 mg total) into the skin daily. Patient not taking: Reported on 08/21/2016 03/23/15   Robbie Lis, MD  famotidine (PEPCID) 20 MG tablet Take 1 tablet (20 mg total) by mouth 2 (two) times daily. Patient not taking: Reported on 08/21/2016 03/23/15   Robbie Lis, MD  insulin aspart (NOVOLOG) 100 UNIT/ML injection Inject 0-9 Units into the skin 3 (three) times daily with meals. Patient not taking: Reported on 08/21/2016 03/23/15   Robbie Lis, MD  ondansetron (ZOFRAN) 4 MG tablet Take 1 tablet (4 mg total) by mouth every 6 (six) hours as needed for nausea. Patient not taking: Reported on 08/21/2016 03/23/15   Robbie Lis, MD  oxyCODONE (OXY IR/ROXICODONE) 5 MG immediate release tablet Take 1 tablet (5 mg total) by mouth every 4 (four) hours as needed for severe pain. Patient not taking: Reported on 08/21/2016 11/13/15   Thurnell Lose, MD  polyethylene glycol (MIRALAX / Floria Raveling) packet Take 17 g by mouth daily as needed for mild constipation. Patient not taking: Reported on 08/21/2016 04/04/15   Donne Hazel, MD    Physical Exam: Vitals:   08/21/16  2003 08/21/16 2007 08/21/16 2112 08/21/16 2113  BP: (!) 126/107   (!) 141/51  Pulse: 74  75   Resp: 14  17   Temp: 98.7 F (37.1 C)  98.6 F (37 C)   TempSrc: Oral  Oral   SpO2: 95% 93% 92%   Weight: 113.4 kg (250 lb)     Height: _0  (1.93 m)        General:  Appears calm and comfortable and is NAD, on BIPAP Eyes:  PERRL, EOMI, normal lids, iris ENT:  grossly normal hearing, lips & tongue, mmm Neck:  no LAD, masses or thyromegaly Cardiovascular:  RRR, no m/r/g.  Respiratory:  CTA bilaterally, no w/r/r. Normal respiratory effort on BIPAP, sats 100%. Abdomen:  soft, ntnd, NABS Skin:  no rash or induration seen on limited exam Musculoskeletal:  grossly normal tone BUE/BLE, good ROM, no bony abnormality.  B LE are wrapped up to the knees; there is 2+ edema underlying the wraps which is tender to palpation.  This is chronic for the patient. Psychiatric:  grossly normal mood and affect, speech fluent and appropriate, AOx3 Neurologic:  CN 2-12 grossly intact, moves all extremities in coordinated fashion, sensation intact  Labs on Admission: I have personally reviewed following labs and imaging studies  CBC:  Recent Labs Lab 08/21/16 1604  WBC 5.9  NEUTROABS 3.4  HGB 9.7*  HCT 33.4*  MCV 63.7*  PLT 784   Basic Metabolic Panel:  Recent Labs Lab 08/21/16 1604  NA 138  K 3.9  CL 96*  CO2 37*  GLUCOSE 233*  BUN 30*  CREATININE 0.94  CALCIUM 8.4*   GFR: Estimated Creatinine Clearance: 112.3 mL/min (by C-G formula based on SCr of 0.94 mg/dL). Liver Function Tests:  Recent Labs Lab 08/21/16 1604  AST  19  ALT 16*  ALKPHOS 131*  BILITOT 0.5  PROT 7.9  ALBUMIN 2.7*   No results for input(s): LIPASE, AMYLASE in the last 168 hours. No results for input(s): AMMONIA in the last 168 hours. Coagulation Profile: No results for input(s): INR, PROTIME in the last 168 hours. Cardiac Enzymes:  Recent Labs Lab 08/21/16 1604  TROPONINI <0.03   BNP (last 3  results) No results for input(s): PROBNP in the last 8760 hours. HbA1C: No results for input(s): HGBA1C in the last 72 hours. CBG: No results for input(s): GLUCAP in the last 168 hours. Lipid Profile: No results for input(s): CHOL, HDL, LDLCALC, TRIG, CHOLHDL, LDLDIRECT in the last 72 hours. Thyroid Function Tests: No results for input(s): TSH, T4TOTAL, FREET4, T3FREE, THYROIDAB in the last 72 hours. Anemia Panel: No results for input(s): VITAMINB12, FOLATE, FERRITIN, TIBC, IRON, RETICCTPCT in the last 72 hours. Urine analysis:    Component Value Date/Time   COLORURINE YELLOW 11/02/2015 2258   APPEARANCEUR CLEAR 11/02/2015 2258   LABSPEC 1.021 11/02/2015 2258   PHURINE 5.0 11/02/2015 2258   GLUCOSEU NEGATIVE 11/02/2015 2258   HGBUR NEGATIVE 11/02/2015 2258   BILIRUBINUR NEGATIVE 11/02/2015 2258   KETONESUR NEGATIVE 11/02/2015 2258   PROTEINUR NEGATIVE 11/02/2015 2258   UROBILINOGEN 0.2 03/26/2015 0144   NITRITE NEGATIVE 11/02/2015 2258   LEUKOCYTESUR SMALL (A) 11/02/2015 2258    Creatinine Clearance: Estimated Creatinine Clearance: 112.3 mL/min (by C-G formula based on SCr of 0.94 mg/dL).  Sepsis Labs: _0 (procalcitonin:4,lacticidven:4) )No results found for this or any previous visit (from the past 240 hour(s)).   Radiological Exams on Admission: Ct Angio Chest Pe W And/or Wo Contrast  Addendum Date: 08/21/2016   ADDENDUM REPORT: 08/21/2016 17:51 ADDENDUM: Mild calcific atherosclerosis of the aortic arch. Electronically Signed   By: Elon Alas M.D.   On: 08/21/2016 17:51   Result Date: 08/21/2016 CLINICAL DATA:  Hypoxia beginning yesterday, finished antibiotics for pneumonia 3 days ago. History of hypertension, hyperlipidemia, diabetes. EXAM: CT ANGIOGRAPHY CHEST WITH CONTRAST TECHNIQUE: Multidetector CT imaging of the chest was performed using the standard protocol during bolus administration of intravenous contrast. Multiplanar CT image reconstructions and  MIPs were obtained to evaluate the vascular anatomy. CONTRAST:  100 cc Isovue 370 COMPARISON:  Chest radiograph November 02, 2015 and CT chest March 17, 2015 FINDINGS: CARDIOVASCULAR: Suboptimal contrast opacification of the pulmonary artery's (175 Hounsfield units, target is 250 Hounsfield units. Main pulmonary artery is not enlarged. No pulmonary arterial filling defects to the level of the segmental branches. Heart size is mildly enlarged, no right heart strain. Mild coronary artery calcifications. No pericardial effusions. Thoracic aorta is normal course and caliber, unremarkable. MEDIASTINUM/NODES: 12 mm short access pre tracheal lymph node, 13 mm short access carinal lymph node. 12 mm RIGHT hilar lymph node. Additional smaller aortopulmonary window and pretracheal lymph nodes. LUNGS/PLEURA: Tracheobronchial tree is patent, no pneumothorax. Tracheal bronchial wall thickening. Persistently elevated RIGHT hemidiaphragm with mild hypo enhancing consolidation RIGHT lower lobe. Bibasilar dependent atelectasis. No pleural effusion. Interlobular septal thickening. UPPER ABDOMEN: Included view of the abdomen is nonacute. Adrenal glands non completely imaged. MUSCULOSKELETAL: Prominent though not pathologically enlarged axillary lymph nodes are likely reactive. Review of the MIP images confirms the above findings. IMPRESSION: No acute pulmonary embolism. Mild cardiomegaly and findings of pulmonary edema without pleural effusion. RIGHT lower lobe atelectasis, and/or pneumonia. Worsening mediastinal lymphadenopathy, again this may be reactive though short-term follow-up is recommended and/or PET-CT. Bronchial wall thickening can be seen with bronchitis/reactive airway disease  and pulmonary edema. Electronically Signed: By: Elon Alas M.D. On: 08/21/2016 17:42    EKG: Independently reviewed.  NSR with rate 78; unremarkable EKG   Assessment/Plan Principal Problem:   Acute respiratory failure with hypoxia and  hypercapnia (HCC) Active Problems:   DM (diabetes mellitus), secondary, uncontrolled, with peripheral vascular complications (HCC)   Hypertension   Moderate protein-calorie malnutrition (HCC)   Stasis edema of both lower extremities   Chronic pain   Respiratory failure -Uncertain etiology -Patient with persistent hypoxia while at SNF for the last few weeks -Reports having had a CXR with PNA and possible CHF, treated for 7 days with abx -Persistently hypoxic despite completion of abx 3 days prior -Reports having had an Echo at the facility today; I am no familiar with Echocardiograms being done at nursing homes and question whether this was an EKG instead, but the patient is adamant that it was an Echo; either the report should be obtained tomorrow or the study likely needs to be repeated -ABG shows marked hypercapnia (pCO2 67.6) and hypoxia (pO2 67.6); this appears to be a metabolic alkalosis with concomitant respiratory acidosis -In looking at the metabolic alkalosis, it seems unlikely that the patient would have milk-alkali syndrome or exogenous bicarb administration since he is in a facility.  He denies GI symptoms and he has normal renal function (although BUN is mildly elevated).  His chloride is mildly low (?chloride resistant?) and he does have a h/o HTN and is not taking diuretics to explain this.  Could be a hyperaldosteronism, either primary or secondary; with a normal potassium, likely secondary.  It could also be RAS.  Will add renin/aldosterone testing as well as a renal US for further evaluation. -While CHF has been an ongoing consideration, his BNP is quite normal.  His CXR/CT were also not confirmatory for CHF.  While pending his Echo (either the result or a study here tomorrow), I would not treat with diuresis at this time. -HCAP is more concerning.  Despite utter lack of symptoms, the patient does have CXR/CT findings.  His WBC count is normal, which also belies this so it is also  not certain.  For now, will treat as HCAP. -Pulmonology was called by the ER physician and they recommended BIPAP.  For now, the plan will be for overnight BIPAP continuously with plan for trial off BIPAP in the AM. -He also appears to have worsening mediastinal LAD and has a slightly increased alk phos; it may be worth attempting to biopsy these nodes either via bronch or with IR. -Would suggest pulm consult in the AM.  DM -The patient is on 70/30 at home and will be NPO overnight. -Will change to Lantus at 1/2 70/30 dose and cover with SSI. -Glucose is elevated on admit, A1c pending (his last one was 8.1 in 2/17).  Stasis edema -Has legs wrapped chronically to help with stasis dermatitis and ulcers. -Continue wraps. -While this may be related to CHF, it may also be primarily venous stasis.  Malnutrition -Nutrition consult.  HTN -Continue Coreg and Lisinopril.    Chronic pain -Receives his prescriptions through his facility and so he does not show up in the [facility]. -It would be unlikely that he was misusing the medications since they are administered through the facility. -Will continue home meds.  DVT prophylaxis: Lovenox Code Status: Full - confirmed with patient Family Communication: None present Disposition Plan: Back to SNF once clinically improved Consults called: Suggest pulmonary in AM Admission status: Admit -  It is my clinical opinion that admission to INPATIENT is reasonable and necessary because this patient will require at least 2 midnights in the hospital to treat this condition based on the medical complexity of the problems presented.  Given the aforementioned information, the predictability of an adverse outcome is felt to be significant.    Karmen Bongo MD [facility]  If 7PM-7AM, please contact night-coverage [email or web address] Password [email or web address]  08/21/2016, 9:21 PM

## 2016-08-21 NOTE — ED Notes (Addendum)
Pt requested Bipap to be taken off so that he could eat and drink.

## 2016-08-21 NOTE — Progress Notes (Signed)
Pt seen in ED8, found on 3lnc, HR72,rr16, Spo2 95%.  Pt was eating and talking on the phone upon my arrival, no respiratory distress noted or voiced by pt at this time.  Per RN, pt taken off bipap per pt request.  Bipap remains in room on standby.  RT will continue to monitor and assess pt as needed.

## 2016-08-22 DIAGNOSIS — I5031 Acute diastolic (congestive) heart failure: Secondary | ICD-10-CM

## 2016-08-22 DIAGNOSIS — E1365 Other specified diabetes mellitus with hyperglycemia: Secondary | ICD-10-CM

## 2016-08-22 DIAGNOSIS — I87303 Chronic venous hypertension (idiopathic) without complications of bilateral lower extremity: Secondary | ICD-10-CM

## 2016-08-22 DIAGNOSIS — J9602 Acute respiratory failure with hypercapnia: Secondary | ICD-10-CM

## 2016-08-22 DIAGNOSIS — E1351 Other specified diabetes mellitus with diabetic peripheral angiopathy without gangrene: Secondary | ICD-10-CM

## 2016-08-22 DIAGNOSIS — J9601 Acute respiratory failure with hypoxia: Principal | ICD-10-CM

## 2016-08-22 DIAGNOSIS — E440 Moderate protein-calorie malnutrition: Secondary | ICD-10-CM

## 2016-08-22 LAB — CBC WITH DIFFERENTIAL/PLATELET
BASOS ABS: 0 10*3/uL (ref 0.0–0.1)
Basophils Relative: 0 %
Eosinophils Absolute: 0.2 10*3/uL (ref 0.0–0.7)
Eosinophils Relative: 5 %
HCT: 32 % — ABNORMAL LOW (ref 39.0–52.0)
Hemoglobin: 9.2 g/dL — ABNORMAL LOW (ref 13.0–17.0)
LYMPHS ABS: 2 10*3/uL (ref 0.7–4.0)
Lymphocytes Relative: 42 %
MCH: 18.3 pg — ABNORMAL LOW (ref 26.0–34.0)
MCHC: 28.8 g/dL — ABNORMAL LOW (ref 30.0–36.0)
MCV: 63.7 fL — ABNORMAL LOW (ref 78.0–100.0)
MONO ABS: 0.4 10*3/uL (ref 0.1–1.0)
MONOS PCT: 9 %
NEUTROS PCT: 44 %
Neutro Abs: 2.1 10*3/uL (ref 1.7–7.7)
PLATELETS: 243 10*3/uL (ref 150–400)
RBC: 5.02 MIL/uL (ref 4.22–5.81)
RDW: 21.3 % — AB (ref 11.5–15.5)
WBC: 4.7 10*3/uL (ref 4.0–10.5)

## 2016-08-22 LAB — BASIC METABOLIC PANEL
Anion gap: 7 (ref 5–15)
BUN: 25 mg/dL — AB (ref 6–20)
CO2: 35 mmol/L — ABNORMAL HIGH (ref 22–32)
Calcium: 8.4 mg/dL — ABNORMAL LOW (ref 8.9–10.3)
Chloride: 99 mmol/L — ABNORMAL LOW (ref 101–111)
Creatinine, Ser: 0.84 mg/dL (ref 0.61–1.24)
Glucose, Bld: 130 mg/dL — ABNORMAL HIGH (ref 65–99)
POTASSIUM: 4 mmol/L (ref 3.5–5.1)
SODIUM: 141 mmol/L (ref 135–145)

## 2016-08-22 LAB — BLOOD GAS, ARTERIAL
ACID-BASE EXCESS: 11.8 mmol/L — AB (ref 0.0–2.0)
BICARBONATE: 37.6 mmol/L — AB (ref 20.0–28.0)
Drawn by: 270211
O2 CONTENT: 2 L/min
O2 SAT: 90.9 %
PATIENT TEMPERATURE: 98.6
PO2 ART: 70 mmHg — AB (ref 83.0–108.0)
pCO2 arterial: 58.7 mmHg — ABNORMAL HIGH (ref 32.0–48.0)
pH, Arterial: 7.423 (ref 7.350–7.450)

## 2016-08-22 LAB — GLUCOSE, CAPILLARY
Glucose-Capillary: 81 mg/dL (ref 65–99)
Glucose-Capillary: 238 mg/dL — ABNORMAL HIGH (ref 65–99)
Glucose-Capillary: 223 mg/dL — ABNORMAL HIGH (ref 65–99)

## 2016-08-22 LAB — STREP PNEUMONIAE URINARY ANTIGEN: STREP PNEUMO URINARY ANTIGEN: NEGATIVE

## 2016-08-22 LAB — HIV ANTIBODY (ROUTINE TESTING W REFLEX): HIV Screen 4th Generation wRfx: NONREACTIVE

## 2016-08-22 LAB — PROCALCITONIN: Procalcitonin: <0.1 ng/mL

## 2016-08-22 MED ORDER — CHLORHEXIDINE GLUCONATE CLOTH 2 % EX PADS
6.0000 | MEDICATED_PAD | Freq: Every day | CUTANEOUS | Status: DC
  Administered 2016-08-22 – 2016-08-25 (×3): 6 via TOPICAL

## 2016-08-22 MED ORDER — CHLORHEXIDINE GLUCONATE 0.12 % MT SOLN
15.0000 mL | Freq: Two times a day (BID) | OROMUCOSAL | Status: DC
  Administered 2016-08-22 – 2016-08-25 (×6): 15 mL via OROMUCOSAL
  Filled 2016-08-22 (×7): qty 15

## 2016-08-22 MED ORDER — MUPIROCIN 2 % EX OINT
1.0000 "application " | TOPICAL_OINTMENT | Freq: Two times a day (BID) | CUTANEOUS | Status: DC
  Administered 2016-08-22 – 2016-08-25 (×7): 1 via NASAL
  Filled 2016-08-22 (×2): qty 22

## 2016-08-22 MED ORDER — ORAL CARE MOUTH RINSE
15.0000 mL | Freq: Two times a day (BID) | OROMUCOSAL | Status: DC
  Administered 2016-08-22 – 2016-08-24 (×6): 15 mL via OROMUCOSAL

## 2016-08-22 MED ORDER — JUVEN PO PACK
1.0000 | PACK | Freq: Two times a day (BID) | ORAL | Status: DC
  Administered 2016-08-22 – 2016-08-25 (×6): 1 via ORAL
  Filled 2016-08-22 (×7): qty 1

## 2016-08-22 MED ORDER — FUROSEMIDE 10 MG/ML IJ SOLN
20.0000 mg | Freq: Two times a day (BID) | INTRAMUSCULAR | Status: DC
  Administered 2016-08-22 – 2016-08-23 (×2): 20 mg via INTRAVENOUS
  Filled 2016-08-22 (×2): qty 2

## 2016-08-22 NOTE — Progress Notes (Signed)
Initial Nutrition Assessment  DOCUMENTATION CODES:   Obesity unspecified  INTERVENTION:  - Will order Juven BID, each supplement provides 80 kcal and 14 grams of essential amino acids. - RD will continue to monitor for nutrition-related needs.  NUTRITION DIAGNOSIS:   Increased nutrient needs related to wound healing as evidenced by estimated needs (for protein).  GOAL:   Patient will meet greater than or equal to 90% of their needs  MONITOR:   PO intake, Supplement acceptance, Weight trends, Labs, Skin, I & O's  REASON FOR ASSESSMENT:   Consult Assessment of nutrition requirement/status  ASSESSMENT:   60 y.o. male with medical history significant of DM, HTN, chronic LE venous stasis with ulcers, and admission in 2/17 for anemia and sepsis presenting with hypoxia.  "I didn't ask to come in here, I was sent here."  He reports that Dr. Delfina Redwood was concerned and sent him here because his O2 as low.  For the last few days at the center, O2 has been very low, 81-92%.  Has been on oxygen and it would go back up to 92-93%, even as high as 96.  Last week, CXR diagnosed PNA and CHF.   Given antibiotics for 7 days, has been off for 2-3 days.  This afternoon, he was given an EKG and echocardiogram.  About 45 minutes later, they called 911.  He was eating a late lunch and didn't even know he was being sent to the hospital.  A nurse has given him this information, he has not discussed it with a doctor at all; he is quite frustrated about this.   Pt seen for consult. BMI indicates obesity. Pt was NPO until breakfast this AM. He received lunch as RD was leaving his room and he reported 100% intake of breakfast this AM ([place] toast, grits, [place] sausage, scrambled eggs, coffee, and orange juice). Despite missing teeth pt denies any chewing difficulties.  He states he has been at [facility] since 03/2015. He has had a very good appetite with no recent changes while at the facility and eats 3  meals/day. He makes sure to eat protein at each meal and follows a heart healthy diet while at [facility].  He states that facility staff check CBGs TID and that for the past 2-3 months his numbers "have been very good." He states he sometimes has highs and lows but typically CBGs are 80-115 mg/dL prior to meals. Last recorded HgA1c was in July at [facility] and was 9.8% at that time. Pt is interested in having HgA1c checked during admission; encouraged him to talk with RN or doctor so that MD may order this if feasible.   Physical assessment shows mild edema but no muscle or fat wasting. Unable to talk with pt about weight trends during this visit but will talk about this at follow-up. No weight hx in chart since February and it shows 18 lb weight gain since that time.   Medications reviewed; sliding scale Novolog, 20 units Lantus/day, 40 mg oral Protonix BID.  Labs reviewed; CBGs: 81 mg/dL, Cl: 99 mmmol/L, BUN: 25 mg/dL, Ca: 8.4 mg/dL.  IVF: LR @ 75 mL/hr.    Diet Order:  Diet Heart Room service appropriate? Yes; Fluid consistency: Thin  Skin:  Wound (see comment) (DM ulcers to bilateral lower legs)  Last BM:  PTA/unknown  Height:   Ht Readings from Last 1 Encounters:  08/21/16 6\' 4"  (1.93 m)    Weight:   Wt Readings from Last 1 Encounters:  08/21/16 250 lb (113.4 kg)    Ideal Body Weight:  91.82 kg  BMI:  Body mass index is 30.43 kg/m.  Estimated Nutritional Needs:   Kcal:  1700-1930 (15-17 kcal/kg)  Protein:  110-120 grams  Fluid:  >/= 1.7 L/day  EDUCATION NEEDS:   No education needs identified at this time    Jarome Matin, MS, RD, LDN, CNSC Inpatient Clinical Dietitian Pager # [phone] After hours/weekend pager # [phone]

## 2016-08-22 NOTE — Progress Notes (Signed)
Pt currently on 3 LPM [place] and tolerating well at this time.  Pt alert and oriented, BIPAP not indicated at this time.  RT spoke with Pt about his OSA and Pt states that he does not utilize CPAP at home and has declined use of one tonight, RN aware.   RT to monitor and assess as needed.

## 2016-08-22 NOTE — Care Management Note (Signed)
Case Management Note  Patient Details  Name: [name] MRN: [id number] Date of Birth: [birth year]  Subjective/Objective:      Respiratory failure and bipap              Action/Plan: home Date:  August 22, 2016 Chart reviewed for concurrent status and case management needs. Will continue to follow patient progress. Discharge Planning: following for needs Expected discharge date: [id number] Velva Harman, BSN, [place], [place]  Expected Discharge Date:   (unknown)               Expected Discharge Plan:  Home/Self Care  In-House Referral:     Discharge planning Services     Post Acute Care Choice:    Choice offered to:     DME Arranged:    DME Agency:     HH Arranged:    [place] Agency:     Status of Service:  In process, will continue to follow  If discussed at Long Length of Stay Meetings, dates discussed:    Additional Comments:  Leeroy Cha, RN 08/22/2016, 9:25 AM

## 2016-08-22 NOTE — Consult Note (Signed)
[place] Nurse wound consult note Reason for Consult:Patient known to me from previous admission.  Chronic, non-healing full thickness ulcers, has been treated at several outpatient wound are centers and at [facility]) in [place].  According to patient , all physicians have now reduced their visits to "whenever you want to see [place]" as there are no new ideas or suggestions for this patient's wound care. I have explained to him that his wounds seem unlikely to heal and that we are searching for a maintenance plan that will reduce odor, leaking dressings and bacterial overgrowth. He is in sad agreement. Wound type:Venous insufficiency, vasculitis, mixed etiology Pressure Ulcer POA: No Measurement: L>R,  Left medial LE measures:  10cm x 11cm x 0.4cm and presents with a smooth wet wound bed and no granulation tissue.  The left Le ulcers encompass an area measuring 16cm x 20cm and involve the posterior calf and lateral LE.  There are 4 ulcers in this space, the largest measures 12cm x 20cm x 0.4cm.  All wounds are red, wet and without granulation tissue. Wound bed:As described above Drainage (amount, consistency, odor) large amount of exudate, malodorous, drainage on exterior of dressing is green. Periwound:Dry, not macerated Dressing procedure/placement/frequency: I will recommend silver hydrofiber dressings covered with silicone foam toppers and follow this with application of bilateral Unna's Boots applied by Ortho Tech. Changes are three times weekly. [facility] nursing team will not follow, but will remain available to this patient, the nursing and medical teams.  Please re-consult if needed. Thanks, Maudie Flakes, MSN, RN, [place], Arther Abbott  Pager# [phone]

## 2016-08-22 NOTE — Progress Notes (Signed)
   08/22/16 1200  Clinical Encounter Type  Visited With Patient  Visit Type Initial;Psychological support;Spiritual support;Critical Care  Referral From Nurse  Consult/Referral To Chaplain  Spiritual Encounters  Spiritual Needs Prayer;Emotional;Other (Comment) (Pastoral Conversation/Support)  Stress Factors  Patient Stress Factors Health changes;Major life changes;Family relationships   I visited with the patient per Spiritual Care consult. The patient was very receptive to [place] support and stated that he would like prayer. The patient is associated with the [employer]. The patient told me that he has a good support system, but that most of his relatives live a distance away and cannot see him regularly. Mr. Haseley states that not being able to see family and having so many medical issues makes him "depressed." He stated that he maintains a good relationship with his wife, who he has been separated from for years. He also receives phone calls from family members who encourage him.  Prayer is something that gives Mr. Kollmann hope and keeps him going. I prayed with him at the bedside and let him know that he can have a nurse page [place] any time.   Please, contact Spiritual Care for further assistance.   [place] M.Div.

## 2016-08-22 NOTE — Progress Notes (Signed)
PROGRESS NOTE  [name] [id number] DOB: 1956/03/13 DOA: 08/21/2016 PCP: Charlsie Merles, MD  HPI/Recap of past 42 hours: 60 year old male with past mental history of chronic lower extremity venous stasis ulcers, diabetes mellitus and hypertension sent over from his skilled nursing facility after he is noted to be hypoxic. Reportedly options saturations were around 81-90% and with oxygen was up to 92%. Apparently last week he was diagnosed with pneumonia and CHF by chest x-ray at the facility and was given antibiotics for 7 days. Patient is unsure what happened yesterday that prompted his transfer to the emergency room and the hospital. In the emergency room, CT scan noted atelectasis versus pneumonia, no PE but possible pulmonary edema. Patient had normal BNP. An ABG done on admission noted hypoxia and hypercapnia although looked to be slightly chronic in nature. Slightly enlarged lymph nodes were noted as well.  This morning, patient states he's feeling okay with no complaints other than his legs always hurting. A repeat ABG noted an improvement in his hypoxia and hypercapnia.  Assessment/Plan: Principal Problem:   Acute respiratory failure with hypoxia and hypercapnia (HCC) secondary to CHF and possible healthcare associated pneumonia: Checking pro-calcitonin to determine if in about accident to be continued. Have started diuresis Active Problems:   DM (diabetes mellitus), secondary, uncontrolled, with peripheral vascular complications [facility]): Continue home meds plus sliding-scale   Hypertension   Moderate protein-calorie malnutrition ([place]): Nutrition to see   Stasis edema of both lower extremities: Wound care consult   Chronic pain   Code Status: Full code   Family Communication: Left message for family   Disposition Plan: Transfer to floor, anticipate potential discharge in next 24-48 hours    Consultants:  None, although discussed case with pulmonary    Procedures:  Echocardiogram: Results pending   Antimicrobials:  IV cefepime and vancomycin 11/29-present   DVT prophylaxis:  Lovenox   Objective: Vitals:   08/22/16 1000 08/22/16 1100 08/22/16 1200 08/22/16 1300  BP:   133/61   Pulse: 91 78 79 89  Resp: 19 16    Temp:   98.1 F (36.7 C)   TempSrc:   Oral   SpO2: 96% 91% 94% 90%  Weight:      Height:        Intake/Output Summary (Last 24 hours) at 08/22/16 1616 Last data filed at 08/22/16 1200  Gross per 24 hour  Intake           1627.5 ml  Output              950 ml  Net            677.5 ml   Filed Weights   08/21/16 2003  Weight: 113.4 kg (250 lb)    Exam:   General:  Alert and oriented 3, no acute distress   Cardiovascular: Regular rate and rhythm, Q000111Q, 2/6 systolic ejection murmur   Respiratory: Decreased breath sounds bibasilar   Abdomen: Soft, nontender, nondistended, positive bowel sounds   Musculoskeletal: Cyanosis, appears to be 1+ pitting edema pedal 8. Legs wrapped   Skin: Patient is chronic venous stasis ulcers, currently both legs are wrapped  Psychiatry: Patient is appropriate, no evidence of psychoses    Data Reviewed: CBC:  Recent Labs Lab 08/21/16 1604 08/22/16 0311  WBC 5.9 4.7  NEUTROABS 3.4 2.1  HGB 9.7* 9.2*  HCT 33.4* 32.0*  MCV 63.7* 63.7*  PLT 239 0000000   Basic Metabolic Panel:  Recent Labs Lab 08/21/16 1604 08/22/16  0311  NA 138 141  K 3.9 4.0  CL 96* 99*  CO2 37* 35*  GLUCOSE 233* 130*  BUN 30* 25*  CREATININE 0.94 0.84  CALCIUM 8.4* 8.4*   GFR: Estimated Creatinine Clearance: 125.6 mL/min (by C-G formula based on SCr of 0.84 mg/dL). Liver Function Tests:  Recent Labs Lab 08/21/16 1604  AST 19  ALT 16*  ALKPHOS 131*  BILITOT 0.5  PROT 7.9  ALBUMIN 2.7*   No results for input(s): LIPASE, AMYLASE in the last 168 hours. No results for input(s): AMMONIA in the last 168 hours. Coagulation Profile: No results for input(s): INR, PROTIME in the  last 168 hours. Cardiac Enzymes:  Recent Labs Lab 08/21/16 1604  TROPONINI <0.03   BNP (last 3 results) No results for input(s): PROBNP in the last 8760 hours. HbA1C: No results for input(s): HGBA1C in the last 72 hours. CBG:  Recent Labs Lab 08/21/16 2216 08/22/16 0826 08/22/16 1206  GLUCAP 207* 81 238*   Lipid Profile: No results for input(s): CHOL, HDL, LDLCALC, TRIG, CHOLHDL, LDLDIRECT in the last 72 hours. Thyroid Function Tests: No results for input(s): TSH, T4TOTAL, FREET4, T3FREE, THYROIDAB in the last 72 hours. Anemia Panel: No results for input(s): VITAMINB12, FOLATE, FERRITIN, TIBC, IRON, RETICCTPCT in the last 72 hours. Urine analysis:    Component Value Date/Time   COLORURINE YELLOW 11/02/2015 [place] 11/02/2015 2258   LABSPEC 1.021 11/02/2015 2258   PHURINE 5.0 11/02/2015 2258   GLUCOSEU NEGATIVE 11/02/2015 2258   HGBUR NEGATIVE 11/02/2015 2258   BILIRUBINUR NEGATIVE 11/02/2015 2258   KETONESUR NEGATIVE 11/02/2015 2258   PROTEINUR NEGATIVE 11/02/2015 2258   UROBILINOGEN 0.2 03/26/2015 0144   NITRITE NEGATIVE 11/02/2015 2258   LEUKOCYTESUR SMALL (A) 11/02/2015 2258   Sepsis Labs: @LABRCNTIP (procalcitonin:4,lacticidven:4)  ) Recent Results (from the past 240 hour(s))  MRSA PCR Screening     Status: Abnormal   Collection Time: 08/21/16  9:40 PM  Result Value Ref Range Status   MRSA by PCR POSITIVE (A) NEGATIVE Final    Comment:        The GeneXpert MRSA Assay (FDA approved for NASAL specimens only), is one component of a comprehensive MRSA colonization surveillance program. It is not intended to diagnose MRSA infection nor to guide or monitor treatment for MRSA infections. RESULT CALLED TO, READ BACK BY AND VERIFIED WITH: [place] [id number] @ 2303 BY J SCOTTON   Culture, blood (routine x 2) Call MD if unable to obtain prior to antibiotics being given     Status: None (Preliminary result)   Collection Time:  08/21/16 10:50 PM  Result Value Ref Range Status   Specimen Description   Final    BLOOD RIGHT HAND Performed at [facility]    Special Requests IN PEDIATRIC BOTTLE [facility]  Final   Culture PENDING  Incomplete   Report Status PENDING  Incomplete      Studies: Ct Angio Chest Pe W And/or Wo Contrast  Addendum Date: 08/21/2016   ADDENDUM REPORT: 08/21/2016 17:51 ADDENDUM: Mild calcific atherosclerosis of the aortic arch. Electronically Signed   By: Elon Alas M.D.   On: 08/21/2016 17:51   Result Date: 08/21/2016 CLINICAL DATA:  Hypoxia beginning yesterday, finished antibiotics for pneumonia 3 days ago. History of hypertension, hyperlipidemia, diabetes. EXAM: CT ANGIOGRAPHY CHEST WITH CONTRAST TECHNIQUE: Multidetector CT imaging of the chest was performed using the standard protocol during bolus administration of intravenous contrast. Multiplanar CT image reconstructions and MIPs were obtained to evaluate  the vascular anatomy. CONTRAST:  100 cc Isovue 370 COMPARISON:  Chest radiograph November 02, 2015 and CT chest March 17, 2015 FINDINGS: CARDIOVASCULAR: Suboptimal contrast opacification of the pulmonary artery's (175 Hounsfield units, target is 250 Hounsfield units. Main pulmonary artery is not enlarged. No pulmonary arterial filling defects to the level of the segmental branches. Heart size is mildly enlarged, no right heart strain. Mild coronary artery calcifications. No pericardial effusions. Thoracic aorta is normal course and caliber, unremarkable. MEDIASTINUM/NODES: 12 mm short access pre tracheal lymph node, 13 mm short access carinal lymph node. 12 mm RIGHT hilar lymph node. Additional smaller aortopulmonary window and pretracheal lymph nodes. LUNGS/PLEURA: Tracheobronchial tree is patent, no pneumothorax. Tracheal bronchial wall thickening. Persistently elevated RIGHT hemidiaphragm with mild hypo enhancing consolidation RIGHT lower lobe. Bibasilar dependent atelectasis. No pleural  effusion. Interlobular septal thickening. UPPER ABDOMEN: Included view of the abdomen is nonacute. Adrenal glands non completely imaged. MUSCULOSKELETAL: Prominent though not pathologically enlarged axillary lymph nodes are likely reactive. Review of the MIP images confirms the above findings. IMPRESSION: No acute pulmonary embolism. Mild cardiomegaly and findings of pulmonary edema without pleural effusion. RIGHT lower lobe atelectasis, and/or pneumonia. Worsening mediastinal lymphadenopathy, again this may be reactive though short-term follow-up is recommended and/or PET-CT. Bronchial wall thickening can be seen with bronchitis/reactive airway disease and pulmonary edema. Electronically Signed: By: Elon Alas M.D. On: 08/21/2016 17:42    Scheduled Meds: . aspirin EC  81 mg Oral Daily  . atorvastatin  80 mg Oral Daily  . carvedilol  3.125 mg Oral BID WC  . ceFEPime (MAXIPIME) IV  1 g Intravenous Q8H  . chlorhexidine  15 mL Mouth Rinse BID  . Chlorhexidine Gluconate Cloth  6 each Topical Q0600  . enoxaparin (LOVENOX) injection  40 mg Subcutaneous Q24H  . ferrous sulfate  325 mg Oral TID WC  . insulin aspart  0-15 Units Subcutaneous TID WC  . insulin glargine  20 Units Subcutaneous QHS  . lisinopril  2.5 mg Oral Daily  . mouth rinse  15 mL Mouth Rinse q12n4p  . mirtazapine  15 mg Oral QHS  . mupirocin ointment  1 application Nasal BID  . nutrition supplement (JUVEN)  1 packet Oral BID BM  . oxybutynin  10 mg Oral QHS  . oxyCODONE  10 mg Oral TID  . oxyCODONE  10 mg Oral Q12H  . pantoprazole  40 mg Oral BID  . pregabalin  150 mg Oral BID  . tamsulosin  0.4 mg Oral QPC supper  . vancomycin  750 mg Intravenous Q12H    Continuous Infusions: . lactated ringers 75 mL/hr at 08/22/16 1200     LOS: 1 day     Annita Brod, MD [facility] Pager [phone]  If 7PM-7AM, please contact night-coverage [email or web address] Password [email or web address] 08/22/2016, 4:16 PM

## 2016-08-23 LAB — BASIC METABOLIC PANEL
Anion gap: 7 (ref 5–15)
BUN: 21 mg/dL — AB (ref 6–20)
CHLORIDE: 95 mmol/L — AB (ref 101–111)
CO2: 32 mmol/L (ref 22–32)
CREATININE: 0.8 mg/dL (ref 0.61–1.24)
Calcium: 8.7 mg/dL — ABNORMAL LOW (ref 8.9–10.3)
GFR calc Af Amer: >60 mL/min (ref >60)
GFR calc non Af Amer: >60 mL/min (ref >60)
Glucose, Bld: 226 mg/dL — ABNORMAL HIGH (ref 65–99)
POTASSIUM: 4 mmol/L (ref 3.5–5.1)
Sodium: 134 mmol/L — ABNORMAL LOW (ref 135–145)

## 2016-08-23 LAB — HEMOGLOBIN A1C
Hgb A1c MFr Bld: 7.8 % — ABNORMAL HIGH (ref 4.8–5.6)
Mean Plasma Glucose: 177 mg/dL

## 2016-08-23 LAB — CBC
HEMATOCRIT: 35.9 % — AB (ref 39.0–52.0)
HEMOGLOBIN: 10.5 g/dL — AB (ref 13.0–17.0)
MCH: 18.8 pg — AB (ref 26.0–34.0)
MCHC: 29.2 g/dL — AB (ref 30.0–36.0)
MCV: 64.1 fL — AB (ref 78.0–100.0)
Platelets: 238 10*3/uL (ref 150–400)
RBC: 5.6 MIL/uL (ref 4.22–5.81)
RDW: 20.9 % — ABNORMAL HIGH (ref 11.5–15.5)
WBC: 4.8 10*3/uL (ref 4.0–10.5)

## 2016-08-23 LAB — GLUCOSE, CAPILLARY
GLUCOSE-CAPILLARY: 295 mg/dL — AB (ref 65–99)
Glucose-Capillary: 274 mg/dL — ABNORMAL HIGH (ref 65–99)
Glucose-Capillary: 321 mg/dL — ABNORMAL HIGH (ref 65–99)
Glucose-Capillary: 257 mg/dL — ABNORMAL HIGH (ref 65–99)
Glucose-Capillary: 298 mg/dL — ABNORMAL HIGH (ref 65–99)

## 2016-08-23 MED ORDER — FUROSEMIDE 10 MG/ML IJ SOLN
40.0000 mg | Freq: Two times a day (BID) | INTRAMUSCULAR | Status: DC
  Administered 2016-08-23 – 2016-08-25 (×4): 40 mg via INTRAVENOUS
  Filled 2016-08-23 (×4): qty 4

## 2016-08-23 MED ORDER — INSULIN ASPART 100 UNIT/ML ~~LOC~~ SOLN
0.0000 [IU] | Freq: Three times a day (TID) | SUBCUTANEOUS | Status: DC
  Administered 2016-08-23: 11 [IU] via SUBCUTANEOUS
  Administered 2016-08-24: 15 [IU] via SUBCUTANEOUS
  Administered 2016-08-24: 7 [IU] via SUBCUTANEOUS
  Administered 2016-08-24: 4 [IU] via SUBCUTANEOUS
  Administered 2016-08-25: 7 [IU] via SUBCUTANEOUS
  Administered 2016-08-25: 11 [IU] via SUBCUTANEOUS

## 2016-08-23 MED ORDER — INSULIN GLARGINE 100 UNIT/ML ~~LOC~~ SOLN
40.0000 [IU] | Freq: Every day | SUBCUTANEOUS | Status: DC
  Administered 2016-08-23: 40 [IU] via SUBCUTANEOUS
  Filled 2016-08-23: qty 0.4

## 2016-08-23 MED ORDER — INSULIN ASPART 100 UNIT/ML ~~LOC~~ SOLN
0.0000 [IU] | Freq: Every day | SUBCUTANEOUS | Status: DC
  Administered 2016-08-23: 3 [IU] via SUBCUTANEOUS
  Administered 2016-08-24: 4 [IU] via SUBCUTANEOUS

## 2016-08-23 NOTE — Progress Notes (Addendum)
PROGRESS NOTE  [name] [id number] DOB: Apr 15, 1956 DOA: 08/21/2016 PCP: Charlsie Merles, MD  HPI/Recap of past 78 hours: 60 year old male with past mental history of chronic lower extremity venous stasis ulcers, diabetes mellitus and hypertension sent over from his skilled nursing facility after he is noted to be hypoxic. Reportedly options saturations were around 81-90% and with oxygen was up to 92%. Apparently last week he was diagnosed with pneumonia and CHF by chest x-ray at the facility and was given antibiotics for 7 days. Patient is unsure what happened yesterday that prompted his transfer to the emergency room and the hospital. In the emergency room, CT scan noted atelectasis versus pneumonia, no PE but possible pulmonary edema. Patient had normal BNP. An ABG done on admission noted hypoxia and hypercapnia although looked to be slightly chronic in nature. Slightly enlarged lymph nodes were noted as well.   A repeat ABG the following day noted an improvement in his hypoxia and hypercapnia.  Patient is continued to respond well to Lasix and is down to 3 L. He has no complaints, no pain or shortness of breath. A pro calcitonin level was checked and found to be normal and antibiotics discontinued  Assessment/Plan: Principal Problem:   Acute respiratory failure with hypoxia and hypercapnia (HCC) secondary to CHF: We will increase Lasix to further diuresis. Patient is normally not on oxygen Active Problems:   DM (diabetes mellitus), secondary, uncontrolled, with peripheral vascular complications [facility]): Increase sliding scale to resistant. Increased Lantus from 20 units to 40 units. Patient baseline at equivalent to 45 units   Hypertension Obesity: Patient meets criteria with BMI greater than 30. Seen by nutrition. On Juven twice a day.   Stasis edema of both lower extremities: Appreciate wound care help. Patient on Silver Hydrofiber dressings covered with silicone foam Toppers and then  application of bilateral Unna boots 3 times a week   Chronic pain   Code Status: Full code   Family Communication: Left message for family   Disposition Plan: Potential discharge tomorrow, once fully diuresed and off of oxygen    Consultants:  None, although discussed case with pulmonary   Procedures:  Echocardiogram: Results pending   Antimicrobials:  IV cefepime and vancomycin 11/29-11/30  DVT prophylaxis:  Lovenox   Objective: Vitals:   08/22/16 2310 08/23/16 0310 08/23/16 0800 08/23/16 1238  BP:    (!) 157/78  Pulse:    72  Resp:    20  Temp: 97.8 F (36.6 C) 98.2 F (36.8 C) 98.3 F (36.8 C) 99.1 F (37.3 C)  TempSrc: Oral Oral Oral Oral  SpO2:    97%  Weight:      Height:        Intake/Output Summary (Last 24 hours) at 08/23/16 1442 Last data filed at 08/23/16 1300  Gross per 24 hour  Intake              450 ml  Output             3000 ml  Net            -2550 ml   Filed Weights   08/21/16 2003  Weight: 113.4 kg (250 lb)    Exam:   General:  Alert and oriented 3, no acute distress   Cardiovascular: Regular rate and rhythm, Q000111Q, 2/6 systolic ejection murmur   Respiratory: Clear to auscultation bilaterally, decreased breath sounds at the bases  Abdomen: Soft, nontender, nondistended, positive bowel sounds   Musculoskeletal: Cyanosis, appears  to be 1+ pitting edema in the feet. Legs wrapped   Skin: Patient is chronic venous stasis ulcers, currently both legs are wrapped  Psychiatry: Patient is appropriate, no evidence of psychoses    Data Reviewed: CBC:  Recent Labs Lab 08/21/16 1604 08/22/16 0311 08/23/16 1326  WBC 5.9 4.7 4.8  NEUTROABS 3.4 2.1  --   HGB 9.7* 9.2* 10.5*  HCT 33.4* 32.0* 35.9*  MCV 63.7* 63.7* 64.1*  PLT 239 243 [id number]   Basic Metabolic Panel:  Recent Labs Lab 08/21/16 1604 08/22/16 0311 08/23/16 1326  NA 138 141 134*  K 3.9 4.0 4.0  CL 96* 99* 95*  CO2 37* 35* 32  GLUCOSE 233* 130* 226*  BUN 30*  25* 21*  CREATININE 0.94 0.84 0.80  CALCIUM 8.4* 8.4* 8.7*   GFR: Estimated Creatinine Clearance: 131.9 mL/min (by C-G formula based on SCr of 0.8 mg/dL). Liver Function Tests:  Recent Labs Lab 08/21/16 1604  AST 19  ALT 16*  ALKPHOS 131*  BILITOT 0.5  PROT 7.9  ALBUMIN 2.7*   No results for input(s): LIPASE, AMYLASE in the last 168 hours. No results for input(s): AMMONIA in the last 168 hours. Coagulation Profile: No results for input(s): INR, PROTIME in the last 168 hours. Cardiac Enzymes:  Recent Labs Lab 08/21/16 1604  TROPONINI <0.03   BNP (last 3 results) No results for input(s): PROBNP in the last 8760 hours. HbA1C:  Recent Labs  08/21/16 2251  HGBA1C 7.8*   CBG:  Recent Labs Lab 08/22/16 1206 08/22/16 1702 08/22/16 2222 08/23/16 0738 08/23/16 1156  GLUCAP 238* 223* 274* 295* 321*   Lipid Profile: No results for input(s): CHOL, HDL, LDLCALC, TRIG, CHOLHDL, LDLDIRECT in the last 72 hours. Thyroid Function Tests: No results for input(s): TSH, T4TOTAL, FREET4, T3FREE, THYROIDAB in the last 72 hours. Anemia Panel: No results for input(s): VITAMINB12, FOLATE, FERRITIN, TIBC, IRON, RETICCTPCT in the last 72 hours. Urine analysis:    Component Value Date/Time   COLORURINE YELLOW 11/02/2015 [place] 11/02/2015 2258   LABSPEC 1.021 11/02/2015 2258   PHURINE 5.0 11/02/2015 2258   GLUCOSEU NEGATIVE 11/02/2015 2258   HGBUR NEGATIVE 11/02/2015 2258   BILIRUBINUR NEGATIVE 11/02/2015 2258   KETONESUR NEGATIVE 11/02/2015 2258   PROTEINUR NEGATIVE 11/02/2015 2258   UROBILINOGEN 0.2 03/26/2015 0144   NITRITE NEGATIVE 11/02/2015 2258   LEUKOCYTESUR SMALL (A) 11/02/2015 2258   Sepsis Labs: @LABRCNTIP (procalcitonin:4,lacticidven:4)  ) Recent Results (from the past 240 hour(s))  MRSA PCR Screening     Status: Abnormal   Collection Time: 08/21/16  9:40 PM  Result Value Ref Range Status   MRSA by PCR POSITIVE (A) NEGATIVE Final     Comment:        The GeneXpert MRSA Assay (FDA approved for NASAL specimens only), is one component of a comprehensive MRSA colonization surveillance program. It is not intended to diagnose MRSA infection nor to guide or monitor treatment for MRSA infections. RESULT CALLED TO, READ BACK BY AND VERIFIED WITH: [place] [id number] @ 2303 BY J SCOTTON   Culture, blood (routine x 2) Call MD if unable to obtain prior to antibiotics being given     Status: None (Preliminary result)   Collection Time: 08/21/16 10:50 PM  Result Value Ref Range Status   Specimen Description BLOOD RIGHT HAND  Final   Special Requests IN PEDIATRIC BOTTLE 2CC  Final   Culture   Final    NO GROWTH 1 DAY Performed at [facility]    Report Status PENDING  Incomplete  Culture, blood (routine x 2) Call MD if unable to obtain prior to antibiotics being given     Status: None (Preliminary result)   Collection Time: 08/21/16 10:51 PM  Result Value Ref Range Status   Specimen Description BLOOD RIGHT ARM  Final   Special Requests BOTTLES DRAWN AEROBIC AND ANAEROBIC [place]  Final   Culture   Final    NO GROWTH 1 DAY Performed at [facility]    Report Status PENDING  Incomplete      Studies: No results found.  Scheduled Meds: . aspirin EC  81 mg Oral Daily  . atorvastatin  80 mg Oral Daily  . carvedilol  3.125 mg Oral BID WC  . chlorhexidine  15 mL Mouth Rinse BID  . Chlorhexidine Gluconate Cloth  6 each Topical Q0600  . enoxaparin (LOVENOX) injection  40 mg Subcutaneous Q24H  . ferrous sulfate  325 mg Oral TID WC  . furosemide  40 mg Intravenous Q12H  . insulin aspart  0-20 Units Subcutaneous TID WC  . insulin aspart  0-5 Units Subcutaneous QHS  . insulin glargine  40 Units Subcutaneous QHS  . lisinopril  2.5 mg Oral Daily  . mouth rinse  15 mL Mouth Rinse q12n4p  . mirtazapine  15 mg Oral QHS  . mupirocin ointment  1 application Nasal BID  . nutrition supplement (JUVEN)  1 packet  Oral BID BM  . oxybutynin  10 mg Oral QHS  . oxyCODONE  10 mg Oral TID  . oxyCODONE  10 mg Oral Q12H  . pantoprazole  40 mg Oral BID  . pregabalin  150 mg Oral BID  . tamsulosin  0.4 mg Oral QPC supper    Continuous Infusions:    LOS: 2 days     Annita Brod, MD [facility] Pager [phone]  If 7PM-7AM, please contact night-coverage [email or web address] Password [email or web address] 08/23/2016, 2:42 PM

## 2016-08-24 ENCOUNTER — Inpatient Hospital Stay (HOSPITAL_COMMUNITY): Payer: Medicare Other

## 2016-08-24 DIAGNOSIS — I5031 Acute diastolic (congestive) heart failure: Secondary | ICD-10-CM

## 2016-08-24 LAB — BASIC METABOLIC PANEL
Anion gap: 6 (ref 5–15)
BUN: 19 mg/dL (ref 6–20)
CHLORIDE: 95 mmol/L — AB (ref 101–111)
CO2: 37 mmol/L — AB (ref 22–32)
Calcium: 8.8 mg/dL — ABNORMAL LOW (ref 8.9–10.3)
Creatinine, Ser: 0.84 mg/dL (ref 0.61–1.24)
GFR calc Af Amer: >60 mL/min (ref >60)
GFR calc non Af Amer: >60 mL/min (ref >60)
GLUCOSE: 168 mg/dL — AB (ref 65–99)
POTASSIUM: 3.8 mmol/L (ref 3.5–5.1)
Sodium: 138 mmol/L (ref 135–145)

## 2016-08-24 LAB — BLOOD GAS, ARTERIAL
Acid-Base Excess: 12.2 mmol/L — ABNORMAL HIGH (ref 0.0–2.0)
BICARBONATE: 37.4 mmol/L — AB (ref 20.0–28.0)
DRAWN BY: 103701
FIO2: 21
O2 Saturation: 83.8 %
PATIENT TEMPERATURE: 37
pCO2 arterial: 52.2 mmHg — ABNORMAL HIGH (ref 32.0–48.0)
pH, Arterial: 7.469 — ABNORMAL HIGH (ref 7.350–7.450)
pO2, Arterial: 53.3 mmHg — ABNORMAL LOW (ref 83.0–108.0)

## 2016-08-24 LAB — GLUCOSE, CAPILLARY
GLUCOSE-CAPILLARY: 186 mg/dL — AB (ref 65–99)
GLUCOSE-CAPILLARY: 301 mg/dL — AB (ref 65–99)
Glucose-Capillary: 246 mg/dL — ABNORMAL HIGH (ref 65–99)
Glucose-Capillary: 308 mg/dL — ABNORMAL HIGH (ref 65–99)

## 2016-08-24 LAB — ECHOCARDIOGRAM COMPLETE
Height: 76 in
WEIGHTICAEL: 3964.75 [oz_av]

## 2016-08-24 LAB — PROCALCITONIN: Procalcitonin: <0.1 ng/mL

## 2016-08-24 LAB — CBC
HEMATOCRIT: 34.7 % — AB (ref 39.0–52.0)
Hemoglobin: 10.1 g/dL — ABNORMAL LOW (ref 13.0–17.0)
MCH: 18.3 pg — AB (ref 26.0–34.0)
MCHC: 29.1 g/dL — AB (ref 30.0–36.0)
MCV: 63 fL — AB (ref 78.0–100.0)
Platelets: 257 10*3/uL (ref 150–400)
RBC: 5.51 MIL/uL (ref 4.22–5.81)
RDW: 20.7 % — AB (ref 11.5–15.5)
WBC: 4.7 10*3/uL (ref 4.0–10.5)

## 2016-08-24 MED ORDER — INSULIN GLARGINE 100 UNIT/ML ~~LOC~~ SOLN
43.0000 [IU] | Freq: Every day | SUBCUTANEOUS | Status: DC
  Administered 2016-08-24: 43 [IU] via SUBCUTANEOUS
  Filled 2016-08-24 (×2): qty 0.43

## 2016-08-24 NOTE — Progress Notes (Signed)
PROGRESS NOTE  [name] [id number] DOB: 22-Sep-1956 DOA: 08/21/2016 PCP: Charlsie Merles, MD  HPI/Recap of past 10 hours: 60 year old male with past mental history of chronic lower extremity venous stasis ulcers, diabetes mellitus and hypertension sent over from his skilled nursing facility after he is noted to be hypoxic. Reportedly options saturations were around 81-90% and with oxygen was up to 92%. Apparently last week he was diagnosed with pneumonia and CHF by chest x-ray at the facility and was given antibiotics for 7 days. Patient is unsure what happened yesterday that prompted his transfer to the emergency room and the hospital. In the emergency room, CT scan noted atelectasis versus pneumonia, no PE but possible pulmonary edema. Patient had normal BNP. An ABG done on admission noted hypoxia and hypercapnia although looked to be slightly chronic in nature. Slightly enlarged lymph nodes were noted as well.   A repeat ABG the following day noted an improvement in his hypoxia and hypercapnia.  Patient continues to respond well to Lasix and is down to 1.5 L He has no complaints, no pain or shortness of breath. A pro calcitonin level was checked and found to be normal and antibiotics discontinued  Assessment/Plan: Principal Problem:   Acute respiratory failure with hypoxia and hypercapnia (HCC) secondary to CHF: Responded well to the increase in Lasix. Oxygen requirements continue to drop. Patient not on any Lasix at home Active Problems:   DM (diabetes mellitus), secondary, uncontrolled, with peripheral vascular complications [facility]): Sugars improved after Lantus dose increased. We'll slightly increase further to 43 units daily at bedtime. Home doses equivalent to 45.    Hypertension Obesity: Patient  meets criteria with BMI greater than 30. Seen by nutrition. On Juven twice a day.   Stasis edema of both lower extremities: Appreciate wound care help. Patient on Silver Hydrofiber  dressings covered with silicone foam Toppers and then application of bilateral Unna boots 3 times a week   Chronic pain   Code Status: Full code   Family Communication: Left message for family   Disposition Plan: Anticipate discharge tomorrow, once fully diuresed and off of oxygen   Consultants:  None, although discussed case with pulmonary   Procedures:  Echocardiogram: Results pending   Antimicrobials:  IV cefepime and vancomycin 11/29-11/30  DVT prophylaxis:  Lovenox   Objective: Vitals:   08/23/16 1238 08/23/16 2137 08/24/16 0613 08/24/16 0955  BP: (!) 157/78 138/78 133/75 134/69  Pulse: 72 74 68   Resp: 20 20 20    Temp: 99.1 F (37.3 C) 99.1 F (37.3 C) 99.1 F (37.3 C)   TempSrc: Oral Oral Oral   SpO2: 97% 92% 90%   Weight:   112.4 kg (247 lb 12.8 oz)   Height:        Intake/Output Summary (Last 24 hours) at 08/24/16 1239 Last data filed at 08/24/16 0845  Gross per 24 hour  Intake              120 ml  Output             1900 ml  Net            -1780 ml   Filed Weights   08/21/16 2003 08/24/16 0613  Weight: 113.4 kg (250 lb) 112.4 kg (247 lb 12.8 oz)    Exam:   General:  Alert and oriented 3, no acute distress   Cardiovascular: Regular rate and rhythm, [id number], 2/6 systolic ejection murmur   Respiratory: Clear to auscultation bilaterally, decreased breath  sounds at the bases  Abdomen: Soft, nontender, nondistended, positive bowel sounds   Musculoskeletal: Cyanosis, appears to be 1+ pitting edema in the feet. Legs wrapped   Skin: Patient is chronic venous stasis ulcers, currently both legs are wrapped  Psychiatry: Patient is appropriate, no evidence of psychoses    Data Reviewed: CBC:  Recent Labs Lab 08/21/16 1604 08/22/16 0311 08/23/16 1326 08/24/16 0537  WBC 5.9 4.7 4.8 4.7  NEUTROABS 3.4 2.1  --   --   HGB 9.7* 9.2* 10.5* 10.1*  HCT 33.4* 32.0* 35.9* 34.7*  MCV 63.7* 63.7* 64.1* 63.0*  PLT 239 243 238 [id number]   Basic  Metabolic Panel:  Recent Labs Lab 08/21/16 1604 08/22/16 0311 08/23/16 1326 08/24/16 0537  NA 138 141 134* 138  K 3.9 4.0 4.0 3.8  CL 96* 99* 95* 95*  CO2 37* 35* 32 37*  GLUCOSE 233* 130* 226* 168*  BUN 30* 25* 21* 19  CREATININE 0.94 0.84 0.80 0.84  CALCIUM 8.4* 8.4* 8.7* 8.8*   GFR: Estimated Creatinine Clearance: 125.1 mL/min (by C-G formula based on SCr of 0.84 mg/dL). Liver Function Tests:  Recent Labs Lab 08/21/16 1604  AST 19  ALT 16*  ALKPHOS 131*  BILITOT 0.5  PROT 7.9  ALBUMIN 2.7*   No results for input(s): LIPASE, AMYLASE in the last 168 hours. No results for input(s): AMMONIA in the last 168 hours. Coagulation Profile: No results for input(s): INR, PROTIME in the last 168 hours. Cardiac Enzymes:  Recent Labs Lab 08/21/16 1604  TROPONINI <0.03   BNP (last 3 results) No results for input(s): PROBNP in the last 8760 hours. HbA1C:  Recent Labs  08/21/16 2251  HGBA1C 7.8*   CBG:  Recent Labs Lab 08/23/16 0738 08/23/16 1156 08/23/16 1718 08/23/16 2148 08/24/16 0802  GLUCAP 295* 321* 257* 298* 186*   Lipid Profile: No results for input(s): CHOL, HDL, LDLCALC, TRIG, CHOLHDL, LDLDIRECT in the last 72 hours. Thyroid Function Tests: No results for input(s): TSH, T4TOTAL, FREET4, T3FREE, THYROIDAB in the last 72 hours. Anemia Panel: No results for input(s): VITAMINB12, FOLATE, FERRITIN, TIBC, IRON, RETICCTPCT in the last 72 hours. Urine analysis:    Component Value Date/Time   COLORURINE YELLOW 11/02/2015 [place] 11/02/2015 2258   LABSPEC 1.021 11/02/2015 2258   PHURINE 5.0 11/02/2015 2258   GLUCOSEU NEGATIVE 11/02/2015 2258   HGBUR NEGATIVE 11/02/2015 2258   BILIRUBINUR NEGATIVE 11/02/2015 2258   KETONESUR NEGATIVE 11/02/2015 2258   PROTEINUR NEGATIVE 11/02/2015 2258   UROBILINOGEN 0.2 03/26/2015 0144   NITRITE NEGATIVE 11/02/2015 2258   LEUKOCYTESUR SMALL (A) 11/02/2015 2258   Sepsis  Labs: @LABRCNTIP (procalcitonin:4,lacticidven:4)  ) Recent Results (from the past 240 hour(s))  MRSA PCR Screening     Status: Abnormal   Collection Time: 08/21/16  9:40 PM  Result Value Ref Range Status   MRSA by PCR POSITIVE (A) NEGATIVE Final    Comment:        The GeneXpert MRSA Assay (FDA approved for NASAL specimens only), is one component of a comprehensive MRSA colonization surveillance program. It is not intended to diagnose MRSA infection nor to guide or monitor treatment for MRSA infections. RESULT CALLED TO, READ BACK BY AND VERIFIED WITH: [place] [id number] @ 2303 BY J SCOTTON   Culture, blood (routine x 2) Call MD if unable to obtain prior to antibiotics being given     Status: None (Preliminary result)   Collection Time: 08/21/16 10:50 PM  Result Value Ref Range  Status   Specimen Description BLOOD RIGHT HAND  Final   Special Requests IN PEDIATRIC BOTTLE [place]  Final   Culture   Final    NO GROWTH 1 DAY Performed at [facility]    Report Status PENDING  Incomplete  Culture, blood (routine x 2) Call MD if unable to obtain prior to antibiotics being given     Status: None (Preliminary result)   Collection Time: 08/21/16 10:51 PM  Result Value Ref Range Status   Specimen Description BLOOD RIGHT ARM  Final   Special Requests BOTTLES DRAWN AEROBIC AND ANAEROBIC [place]  Final   Culture   Final    NO GROWTH 1 DAY Performed at [facility]    Report Status PENDING  Incomplete      Studies: No results found.  Scheduled Meds: . aspirin EC  81 mg Oral Daily  . atorvastatin  80 mg Oral Daily  . carvedilol  3.125 mg Oral BID WC  . chlorhexidine  15 mL Mouth Rinse BID  . Chlorhexidine Gluconate Cloth  6 each Topical Q0600  . enoxaparin (LOVENOX) injection  40 mg Subcutaneous Q24H  . ferrous sulfate  325 mg Oral TID WC  . furosemide  40 mg Intravenous Q12H  . insulin aspart  0-20 Units Subcutaneous TID WC  . insulin aspart  0-5 Units  Subcutaneous QHS  . insulin glargine  40 Units Subcutaneous QHS  . lisinopril  2.5 mg Oral Daily  . mouth rinse  15 mL Mouth Rinse q12n4p  . mirtazapine  15 mg Oral QHS  . mupirocin ointment  1 application Nasal BID  . nutrition supplement (JUVEN)  1 packet Oral BID BM  . oxybutynin  10 mg Oral QHS  . oxyCODONE  10 mg Oral TID  . oxyCODONE  10 mg Oral Q12H  . pantoprazole  40 mg Oral BID  . pregabalin  150 mg Oral BID  . tamsulosin  0.4 mg Oral QPC supper    Continuous Infusions:    LOS: 3 days     Annita Brod, MD [facility] Pager [phone]  If 7PM-7AM, please contact night-coverage [email or web address] Password [facility] 08/24/2016, 12:39 PM

## 2016-08-24 NOTE — Progress Notes (Signed)
*  PRELIMINARY RESULTS* Echocardiogram 2D Echocardiogram has been performed.  [name] 08/24/2016, 4:00 PM

## 2016-08-25 DIAGNOSIS — I5031 Acute diastolic (congestive) heart failure: Secondary | ICD-10-CM | POA: Diagnosis present

## 2016-08-25 DIAGNOSIS — R599 Enlarged lymph nodes, unspecified: Secondary | ICD-10-CM

## 2016-08-25 LAB — GLUCOSE, CAPILLARY
Glucose-Capillary: 247 mg/dL — ABNORMAL HIGH (ref 65–99)
Glucose-Capillary: 261 mg/dL — ABNORMAL HIGH (ref 65–99)

## 2016-08-25 MED ORDER — OXYCODONE HCL 10 MG PO TABS: 10.0000 mg | ORAL_TABLET | Freq: Three times a day (TID) | ORAL | 0 refills | Status: DC

## 2016-08-25 MED ORDER — OXYCODONE HCL ER 10 MG PO T12A: 10.0000 mg | EXTENDED_RELEASE_TABLET | Freq: Two times a day (BID) | ORAL | 0 refills | Status: DC

## 2016-08-25 MED ORDER — FUROSEMIDE 40 MG PO TABS: 40.0000 mg | ORAL_TABLET | Freq: Every day | ORAL | 1 refills | Status: DC

## 2016-08-25 MED ORDER — MUPIROCIN 2 % EX OINT: 1.0000 "application " | TOPICAL_OINTMENT | Freq: Two times a day (BID) | CUTANEOUS | 0 refills | Status: AC

## 2016-08-25 NOTE — Progress Notes (Signed)
Report given to Timmothy Sours, RN at [facility].

## 2016-08-25 NOTE — Progress Notes (Signed)
SATURATION QUALIFICATIONS: (This note is used to comply with regulatory documentation for home oxygen)  Patient Saturations on Room Air at Rest = 100%  Patient Saturations on Room Air while Ambulating = 89%  Patient Saturations on 2 Liters of oxygen while Ambulating = 94%  Please briefly explain why patient needs home oxygen:

## 2016-08-25 NOTE — Discharge Summary (Addendum)
Discharge Summary  [name] [id number] DOB: 1956/06/09  PCP: Charlsie Merles, MD  Admit date: 08/21/2016 Discharge date: 08/25/2016  Time spent: 25 min   Recommendations for Outpatient Follow-up:  1. Patient is discharged back to skilled nursing 2. New Medication: Lasix 40 mg by mouth daily  3. Patient's PCP will follow-up on lymph node enlargement described below.  Repeat CT or PET scan advised 4. Pt will be discharged on chronic oxygen 2 lit [place]  Discharge Diagnoses:  Active Hospital Problems   Diagnosis Date Noted  . Acute diastolic heart failure ([place]) 08/25/2016  . Acute respiratory failure with hypoxia and hypercapnia ([place]) 08/21/2016  . Moderate protein-calorie malnutrition ([place]) 08/21/2016  . Stasis edema of both lower extremities 08/21/2016  . Chronic pain 08/21/2016  . Hypertension   . DM (diabetes mellitus), secondary, uncontrolled, with peripheral vascular complications ([place]) 0000000    Resolved Hospital Problems   Diagnosis Date Noted Date Resolved  . Edema  08/21/2016    Discharge Condition: Improved, being discharged back to skilled nursing   Diet recommendation: Heart healthy   Vitals:   08/25/16 0459 08/25/16 0848  BP: 133/73 137/62  Pulse: 71 64  Resp: 16   Temp: 99 F (37.2 C)     History of present illness:   60 year old male with past medical history of chronic lower extremity venous stasis ulcers, diabetes mellitus and hypertension sent over from his skilled nursing facility after he is noted to be hypoxic. Reportedly options saturations were around 81-90% and with oxygen was up to 92%.   Apparently last week he was diagnosed with pneumonia and CHF by chest x-ray at the facility and was given antibiotics for 7 days. Patient is unsure what happened yesterday that prompted his transfer to the emergency room and the hospital. In the emergency room, CT scan noted atelectasis versus pneumonia, no PE but possible pulmonary edema. Also noted  were some lymph nodes which based off of a previous scan was slightly larger.  Patient had normal BNP. An ABG done on admission noted hypoxia and hypercapnia although looked to be slightly chronic in nature. Patient admitted for acute respiratory failure secondary to heart failure. Started on Lasix.   Hospital Course:  Principal Problem:   Acute respiratory failure with hypoxia and hypercapnia (HCC) secondary to CHF: Initially on antibiotics, however once pro-calcitonin levels came back normal, and stenotic discontinued. Patient responded well to aggressive diuresis. He diuresed about 4.2 L. Oxygen requirements continued to drop and by 12/3, weight had been stable for the past 24 hours and pt's oxygen status was as follows:  Patient Saturations on Room Air at Rest = 100%  Patient Saturations on Room Air while Ambulating = 89%  Patient Saturations on 2 Liters of oxygen while Ambulating = 94%  At this point, felt to be medically stable for discharge. He will need 2 lit oxygen from here on out.  Echocardiogram done 12/2 noted grade 2 diastolic dysfunction. Patient already on ACE inhibitor. By mouth Lasix added   Active Problems:   DM (diabetes mellitus), secondary, uncontrolled, with peripheral vascular complications [facility]): Sugars initially elevated, but improved after Lantus dose increased. Patient will be discharged on his home dose of the/30    Hypertension Obesity: Patient meets criteria with BMI greater than 30. Seen by nutrition. On Juven twice a day.   Stasis edema of both lower extremities: Appreciate wound care help. Patient on Silver Hydrofiber dressings covered with silicone foam Toppers and then application of bilateral Unna boots  3 times a week  Chronic pain: Continue home pain medications  Lymphadenopathy: On initial CT looked to be larger than previous. He'll to possibly be reactive although short-term follow-up recommended with repeat CT versus PET scan in the next few  weeks  Procedures:  Echocardiogram done 12/2: Noted grade 2 diastolic dysfunction   Consultations:  None although case discussed with pulmonary on admission   Discharge Exam: BP 137/62 (BP Location: Left Arm)   Pulse 64   Temp 99 F (37.2 C) (Oral)   Resp 16   Ht 6\' 4"  (1.93 m)   Wt 112.5 kg (248 lb 0.3 oz)   SpO2 100%   BMI 30.19 kg/m   General: Alert and oriented 3, no acute distress  Cardiovascular: Regular rate and rhythm, S1-S2  Respiratory: Clear to auscultation bilaterally   Discharge Instructions You were cared for by a hospitalist during your hospital stay. If you have any questions about your discharge medications or the care you received while you were in the hospital after you are discharged, you can call the unit and asked to speak with the hospitalist on call if the hospitalist that took care of you is not available. Once you are discharged, your primary care physician will handle any further medical issues. Please note that NO REFILLS for any discharge medications will be authorized once you are discharged, as it is imperative that you return to your primary care physician (or establish a relationship with a primary care physician if you do not have one) for your aftercare needs so that they can reassess your need for medications and monitor your lab values.  Discharge Instructions    Diet - low sodium heart healthy    Complete by:  As directed    Increase activity slowly    Complete by:  As directed        Medication List    TAKE these medications   acetaminophen 325 MG tablet Commonly known as:  TYLENOL Take 2 tablets (650 mg total) by mouth every 6 (six) hours as needed for mild pain (or Fever >/= 101).   aspirin EC 81 MG tablet Take 81 mg by mouth every morning.   atorvastatin 80 MG tablet Commonly known as:  LIPITOR Take 80 mg by mouth daily.   carvedilol 3.125 MG tablet Commonly known as:  COREG Take 1 tablet (3.125 mg total) by mouth 2 (two)  times daily with a meal.   feeding supplement (PRO-STAT SUGAR FREE 64) Liqd Take 30 mLs by mouth 3 (three) times daily with meals.   ferrous sulfate 325 (65 FE) MG tablet Take 325 mg by mouth 3 (three) times daily with meals.   furosemide 40 MG tablet Commonly known as:  LASIX Take 1 tablet (40 mg total) by mouth daily.   insulin aspart 100 UNIT/ML injection Commonly known as:  NOVOLOG Before each meal 3 times a day, 140-199 - 2 units, 200-250 - 4 units, 251-299 - 6 units,  300-349 - 8 units,  350 or above 10 units. Dispense syringes and needles as needed, Ok to switch to PEN if approved. Substitute to any brand approved. DX DM2, Code E11.65   insulin aspart protamine- aspart (70-30) 100 UNIT/ML injection Commonly known as:  NOVOLOG MIX 70/30 Inject 0.25 mLs (25 Units total) into the skin 2 (two) times daily with a meal. What changed:  how much to take   lidocaine 2 % solution Commonly known as:  XYLOCAINE as directed. Use solution over affected area  3 times a week with dressings changes.   lisinopril 2.5 MG tablet Commonly known as:  PRINIVIL,ZESTRIL Take 1 tablet (2.5 mg total) by mouth daily.   LYRICA 150 MG capsule Generic drug:  pregabalin Take 150 mg by mouth 2 (two) times daily.   mirtazapine 15 MG tablet Commonly known as:  REMERON Take 15 mg by mouth at bedtime.   multivitamin with minerals Tabs tablet Take 1 tablet by mouth daily.   mupirocin ointment 2 % Commonly known as:  BACTROBAN Place 1 application into the nose 2 (two) times daily.   oxybutynin 10 MG 24 hr tablet Commonly known as:  DITROPAN-XL Take 10 mg by mouth at bedtime.   oxyCODONE 10 mg 12 hr tablet Commonly known as:  OXYCONTIN Take 1 tablet (10 mg total) by mouth every 12 (twelve) hours.   Oxycodone HCl 10 MG Tabs Take 1 tablet (10 mg total) by mouth 3 (three) times daily.   pantoprazole 40 MG tablet Commonly known as:  PROTONIX Take 1 tablet (40 mg total) by mouth 2 (two) times  daily.   tamsulosin 0.4 MG Caps capsule Commonly known as:  FLOMAX Take 0.4 mg by mouth daily after supper.   tiZANidine 4 MG tablet Commonly known as:  ZANAFLEX Take 4 mg by mouth every 8 (eight) hours as needed for muscle spasms.      Allergies  Allergen Reactions  . Other     Pt is a Jehovah Witness. No blood products.  . Sulfa Antibiotics     Causes flu-like symptom : sweating, chills, fever, body aches      The results of significant diagnostics from this hospitalization (including imaging, microbiology, ancillary and laboratory) are listed below for reference.    Significant Diagnostic Studies: Ct Angio Chest Pe W And/or Wo Contrast  Addendum Date: 08/21/2016   ADDENDUM REPORT: 08/21/2016 17:51 ADDENDUM: Mild calcific atherosclerosis of the aortic arch. Electronically Signed   By: Elon Alas M.D.   On: 08/21/2016 17:51   Result Date: 08/21/2016 CLINICAL DATA:  Hypoxia beginning yesterday, finished antibiotics for pneumonia 3 days ago. History of hypertension, hyperlipidemia, diabetes. EXAM: CT ANGIOGRAPHY CHEST WITH CONTRAST TECHNIQUE: Multidetector CT imaging of the chest was performed using the standard protocol during bolus administration of intravenous contrast. Multiplanar CT image reconstructions and MIPs were obtained to evaluate the vascular anatomy. CONTRAST:  100 cc Isovue 370 COMPARISON:  Chest radiograph November 02, 2015 and CT chest March 17, 2015 FINDINGS: CARDIOVASCULAR: Suboptimal contrast opacification of the pulmonary artery's (175 Hounsfield units, target is 250 Hounsfield units. Main pulmonary artery is not enlarged. No pulmonary arterial filling defects to the level of the segmental branches. Heart size is mildly enlarged, no right heart strain. Mild coronary artery calcifications. No pericardial effusions. Thoracic aorta is normal course and caliber, unremarkable. MEDIASTINUM/NODES: 12 mm short access pre tracheal lymph node, 13 mm short access carinal  lymph node. 12 mm RIGHT hilar lymph node. Additional smaller aortopulmonary window and pretracheal lymph nodes. LUNGS/PLEURA: Tracheobronchial tree is patent, no pneumothorax. Tracheal bronchial wall thickening. Persistently elevated RIGHT hemidiaphragm with mild hypo enhancing consolidation RIGHT lower lobe. Bibasilar dependent atelectasis. No pleural effusion. Interlobular septal thickening. UPPER ABDOMEN: Included view of the abdomen is nonacute. Adrenal glands non completely imaged. MUSCULOSKELETAL: Prominent though not pathologically enlarged axillary lymph nodes are likely reactive. Review of the MIP images confirms the above findings. IMPRESSION: No acute pulmonary embolism. Mild cardiomegaly and findings of pulmonary edema without pleural effusion. RIGHT lower lobe atelectasis, and/or pneumonia.  Worsening mediastinal lymphadenopathy, again this may be reactive though short-term follow-up is recommended and/or PET-CT. Bronchial wall thickening can be seen with bronchitis/reactive airway disease and pulmonary edema. Electronically Signed: By: Elon Alas M.D. On: 08/21/2016 17:42    Microbiology: Recent Results (from the past 240 hour(s))  MRSA PCR Screening     Status: Abnormal   Collection Time: 08/21/16  9:40 PM  Result Value Ref Range Status   MRSA by PCR POSITIVE (A) NEGATIVE Final    Comment:        The GeneXpert MRSA Assay (FDA approved for NASAL specimens only), is one component of a comprehensive MRSA colonization surveillance program. It is not intended to diagnose MRSA infection nor to guide or monitor treatment for MRSA infections. RESULT CALLED TO, READ BACK BY AND VERIFIED WITH: [place] [id number] @ 2303 BY J SCOTTON   Culture, blood (routine x 2) Call MD if unable to obtain prior to antibiotics being given     Status: None (Preliminary result)   Collection Time: 08/21/16 10:50 PM  Result Value Ref Range Status   Specimen Description BLOOD RIGHT HAND  Final    Special Requests IN PEDIATRIC BOTTLE [place]  Final   Culture   Final    NO GROWTH 2 DAYS Performed at [facility]    Report Status PENDING  Incomplete  Culture, blood (routine x 2) Call MD if unable to obtain prior to antibiotics being given     Status: None (Preliminary result)   Collection Time: 08/21/16 10:51 PM  Result Value Ref Range Status   Specimen Description BLOOD RIGHT ARM  Final   Special Requests BOTTLES DRAWN AEROBIC AND ANAEROBIC 6[place]  Final   Culture   Final    NO GROWTH 2 DAYS Performed at [facility]    Report Status PENDING  Incomplete     Labs: Basic Metabolic Panel:  Recent Labs Lab 08/21/16 1604 08/22/16 0311 08/23/16 1326 08/24/16 0537  NA 138 141 134* 138  K 3.9 4.0 4.0 3.8  CL 96* 99* 95* 95*  CO2 37* 35* 32 37*  GLUCOSE 233* 130* 226* 168*  BUN 30* 25* 21* 19  CREATININE 0.94 0.84 0.80 0.84  CALCIUM 8.4* 8.4* 8.7* 8.8*   Liver Function Tests:  Recent Labs Lab 08/21/16 1604  AST 19  ALT 16*  ALKPHOS 131*  BILITOT 0.5  PROT 7.9  ALBUMIN 2.7*   No results for input(s): LIPASE, AMYLASE in the last 168 hours. No results for input(s): AMMONIA in the last 168 hours. CBC:  Recent Labs Lab 08/21/16 1604 08/22/16 0311 08/23/16 1326 08/24/16 0537  WBC 5.9 4.7 4.8 4.7  NEUTROABS 3.4 2.1  --   --   HGB 9.7* 9.2* 10.5* 10.1*  HCT 33.4* 32.0* 35.9* 34.7*  MCV 63.7* 63.7* 64.1* 63.0*  PLT 239 243 238 257   Cardiac Enzymes:  Recent Labs Lab 08/21/16 1604  TROPONINI <0.03   BNP: BNP (last 3 results)  Recent Labs  08/21/16 1604  BNP 23.9    ProBNP (last 3 results) No results for input(s): PROBNP in the last 8760 hours.  CBG:  Recent Labs Lab 08/24/16 0802 08/24/16 1221 08/24/16 1639 08/24/16 2114 08/25/16 0747  GLUCAP 186* 301* 246* 308* 247*       Signed:  Annita Brod, MD [facility] 08/25/2016, 11:01 AM

## 2016-08-25 NOTE — Progress Notes (Signed)
Patient will discharge to [facility] Anticipated discharge date: 12/3 Family notified: pt to notify Transportation by ptar- called at 12:45pm  [place] signing off.  Jorge Ny, LCSW Clinical Social Worker [phone]

## 2016-08-25 NOTE — Care Management Important Message (Signed)
Important Message  Patient Details  Name: [name] MRN: [id number] Date of Birth: [birth year]   Medicare Important Message Given:  Yes    Erenest Rasher, RN 08/25/2016, 12:49 PM

## 2016-08-25 NOTE — Progress Notes (Signed)
SATURATION QUALIFICATIONS: (This note is used to comply with regulatory documentation for home oxygen)  Patient Saturations on Room Air at Rest = 88%  Patient Saturations on Room Air while Ambulating = 87%  Patient Saturations on 2Liters of oxygen while Ambulating = 93%  Please briefly explain why patient needs home oxygen: Contacted attending to make him aware.  Jonnie Finner RN CCM Case Mgmt phone [phone]

## 2016-08-25 NOTE — Care Management Note (Addendum)
Case Management Note  Patient Details  Name: Asriel Okon MRN: [id number] Date of Birth: [birth year]  Subjective/Objective:    Respiratory failure with hypoxia, CHF                Action/Plan: Discharge Planning: AVS reviewed: Chart reviewed. Unit RN will check ambulating sats. Pt was oxygen while in hospital. CSW following SNF placement. Scheduled dc to SNF -rehab.    Pt oxygen sats dropped down to 87% while up ambulating in hall. Placed on 2L and sats up to 93%. Spoke to Gary.     PCP Charlsie Merles MD  Expected Discharge Date:  08/25/2016              Expected Discharge Plan:  [place]  In-House Referral:  Clinical Social Work  Discharge planning Services  CM Consult  Post Acute Care Choice:  NA Choice offered to:  NA  DME Arranged:  N/A DME Agency:  NA  HH Arranged:  NA HH Agency:  NA  Status of Service:  Completed, signed off  If discussed at [place] of Stay Meetings, dates discussed:    Additional Comments:  Erenest Rasher, RN 08/25/2016, 12:49 PM

## 2016-08-26 LAB — ALDOSTERONE + RENIN ACTIVITY W/ RATIO
ALDO / PRA Ratio: 0.5 (ref 0.0–30.0)
ALDOSTERONE: 2.1 ng/dL (ref 0.0–30.0)
PRA LC/MS/MS: 3.947 ng/mL/hr (ref 0.167–5.380)

## 2016-08-27 LAB — CULTURE, BLOOD (ROUTINE X 2)
Culture: NO GROWTH
Culture: NO GROWTH

## 2016-10-25 IMAGING — CT CT EXTREM LOW BILAT W/ CM
3 of 7 series · 9 of 34 positions shown, 11 images · IV contrast (agent unspecified)
Comparison: 11/02/2015

CONTRAST:  100 cc Omnipaque 300

CLINICAL DATA: Sepsis secondary to cellulitis. Bilateral lower
extremity cellulitis and chronic ulcers. Continued fevers despite
the use of broad-spectrum antibiotics.

EXAM:
CT OF THE RIGHT TIBIA/FIBULA WITH CONTRAST
CT OF THE LEFT TIBIA/FIBULA WITH CONTRAST
CT OF THE RIGHT FOOT WITH CONTRAST, LIMITED
CT OF THE LEFT FOOT WITH CONTRAST, LIMITED
TECHNIQUE: Multidetector CT imaging of the bilateral lower leg was performed
according to the standard protocol following intravenous contrast
administration.

[Series 6: lower ext 1.5 st · axial · 0.80mm/px · z∈[-720,-173]mm · 5 of 529 slices shown, 7 images]
[im 82/529  soft-tissue]
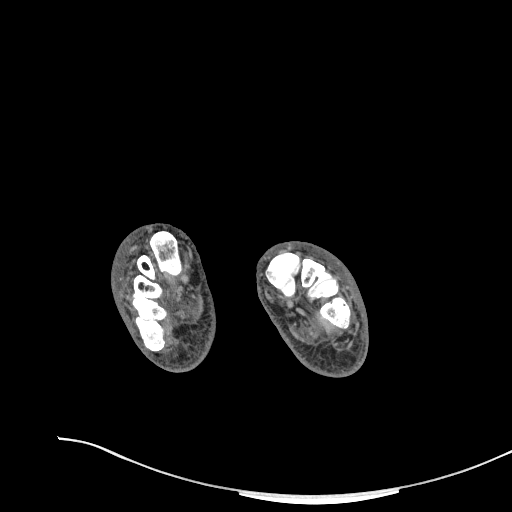
[im 82/529  bone]
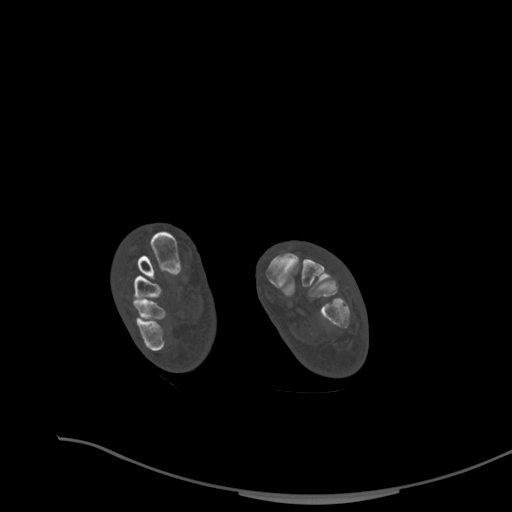
[im 163/529  bone]
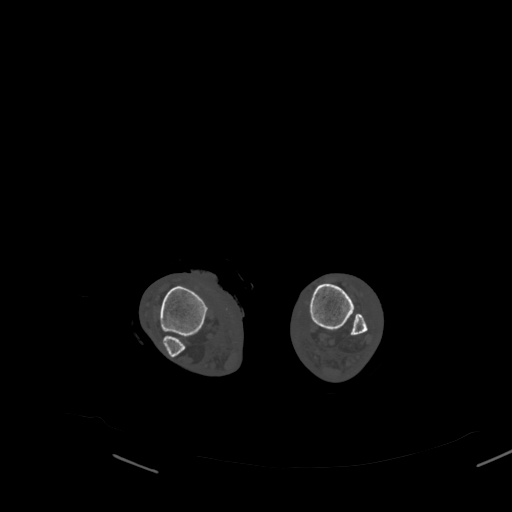
[im 285/529  bone]
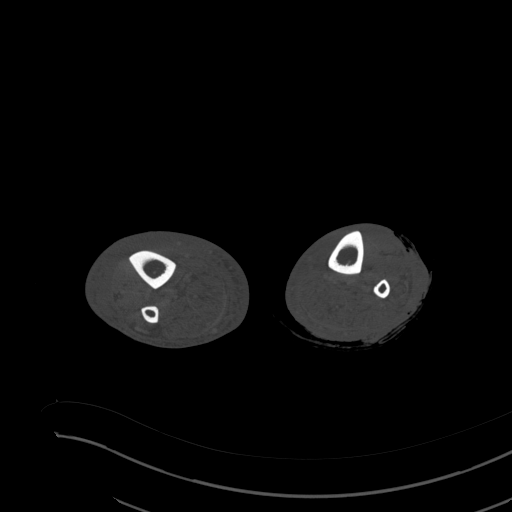
[im 366/529  bone]
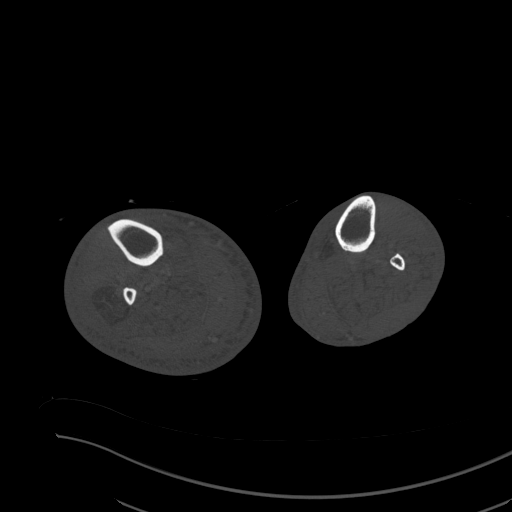
[im 447/529  soft-tissue]
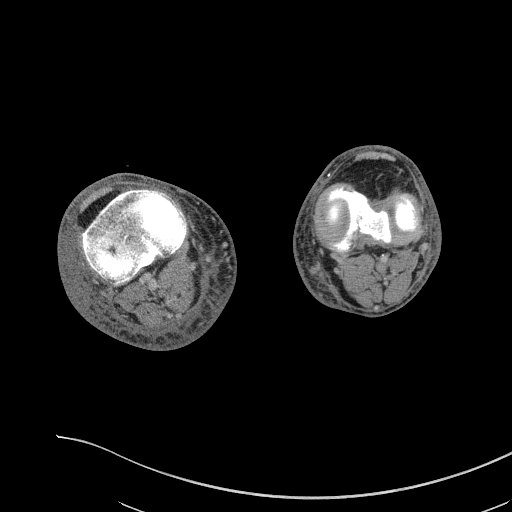
[im 447/529  bone]
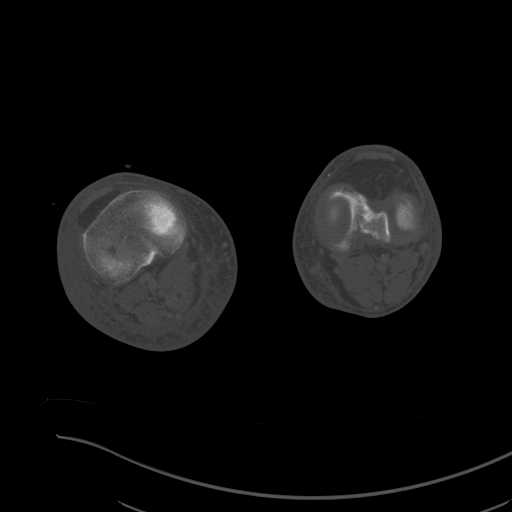

[Series 14: lt lower ext sag bone · sagittal · 0.84mm/px · 2 of 145 slices shown]
[im 49/145  bone]
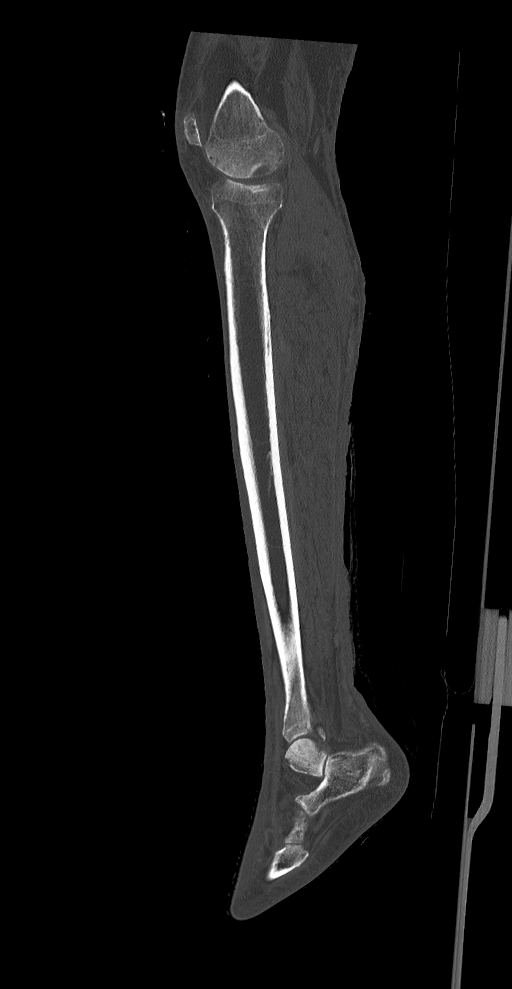
[im 97/145  bone]
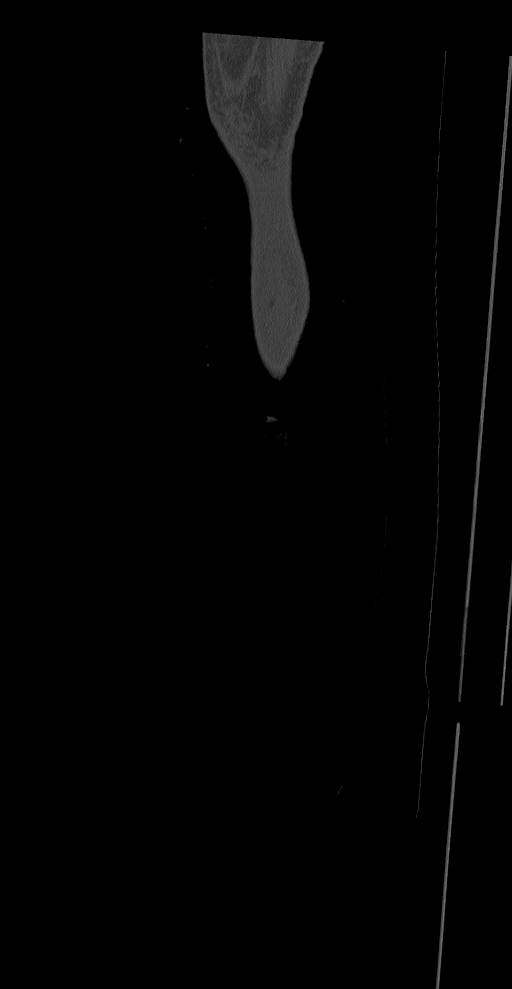

[Series 15: lt lower ext cor st · coronal · 0.40mm/px · 2 of 219 slices shown]
[im 73/219  bone]
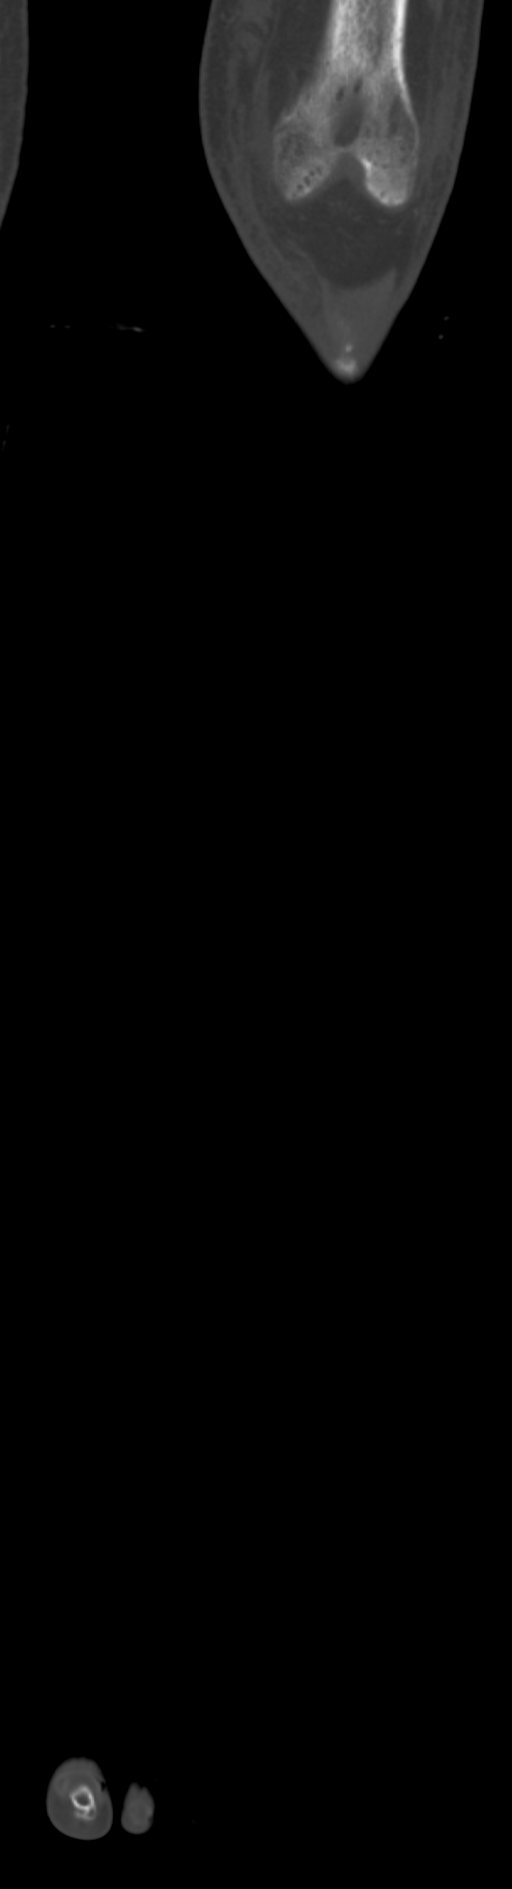
[im 146/219  bone]
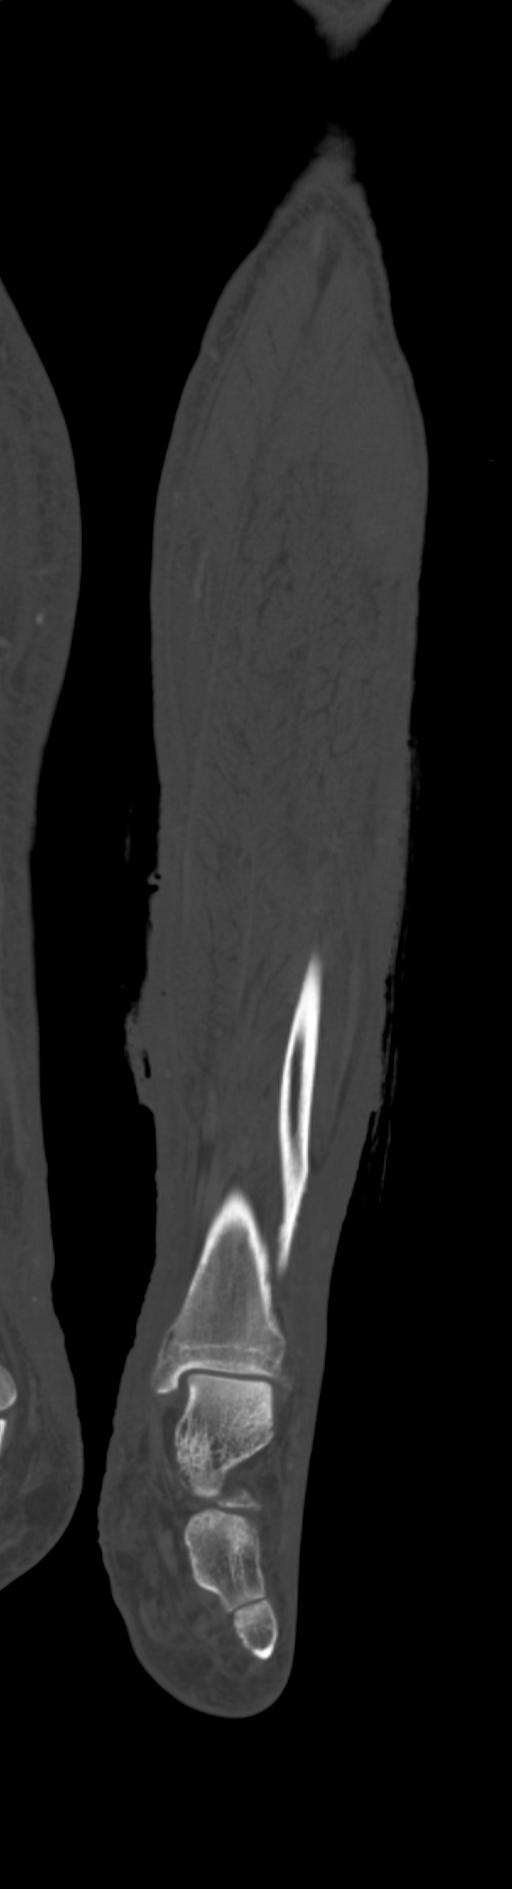

[9 of 34 positions shown; findings below may reference images not displayed]

FINDINGS: CT OF THE RIGHT TIBIA/ FIBULA:

Chronically fragmented spurring in the distal quadriceps tendon.
today's exam includes the knee as well as the tibia and fibula and
foot. Small right knee effusion. Diffuse subcutaneous edema in the
calf. Diffuse fatty atrophy of the right peroneus longus muscle.
Suspected ulceration along the medial right ankle, superficial to
the medial malleolus. No bony destructive findings in the tibia or
fibula. No discrete drainable abscess is detected.

CT OF THE LEFT TIBIA/ FIBULA:

The knee was included. Distal quadriceps spurring. Popliteal artery
atherosclerotic calcification. Medial compartmental articular space
narrowing compatible with osteoarthritis.

Diffuse subcutaneous edema along the calf. Skin and subcutaneous
tissue irregularity along the posterior and lateral calf tracking
down towards the ankle, suspicious for ulceration and skin wounds. I
do not observe a discrete drainable abscess. No bony destructive
findings compatible with osteomyelitis.

CT OF THE RIGHT FOOT:

Please note that multiplanar reconstructions were performed with
respect to the tibia and fibula, and not with the foot. This lowers
the utility of today's exam as a foot study.

There is diffuse subcutaneous edema tracking along the ankle and the
dorsum of the foot. Mild degenerative midfoot findings. No bony
destructive findings in the foot to favor osteomyelitis. No
drainable abscess in the right foot. Diffuse atrophy of the plantar
foot musculature. There is a 5 mm non-fragmented osteochondral
lesion along the medial talar dome on image 109 of series 11.

CT OF THE LEFT FOOT:

Subcutaneous edema both medially and laterally along the ankle. Is
subcutaneous edema along the dorsum of the foot and along the medial
ball of the foot.

No bony destructive findings are identified.
IMPRESSION: 1. Diffuse subcutaneous edema in both calves and tracking along the
dorsum of both feet. Skin and subcutaneous irregularities compatible
with ulcerations more notable on the posterolateral left calf and
along the medial right ankle. No drainable abscess or bony
destructive findings characteristic of osteomyelitis.

## 2016-12-05 ENCOUNTER — Telehealth: Payer: Self-pay | Admitting: Oncology

## 2016-12-05 NOTE — Telephone Encounter (Signed)
Spoke to [employer] at [facility] to schedule appt for the pt to see Dr. Alen Blew on 3/28 at 2pm. Aware to have the pt arrive 30 minutes early. Voiced understanding

## 2016-12-10 DIAGNOSIS — C4490 Unspecified malignant neoplasm of skin, unspecified: Secondary | ICD-10-CM

## 2016-12-10 HISTORY — DX: Unspecified malignant neoplasm of skin, unspecified: C44.90

## 2016-12-17 ENCOUNTER — Telehealth: Payer: Self-pay | Admitting: *Deleted

## 2016-12-17 ENCOUNTER — Encounter: Payer: Self-pay | Admitting: *Deleted

## 2016-12-17 NOTE — Telephone Encounter (Signed)
Tried calling patient to remind him of his new patient appointment tomorrow with Dr. Alen Blew at 2:00 pm. I was not able to leave a message on home phone. If patient calls, this message needs to be given to him.

## 2016-12-18 ENCOUNTER — Ambulatory Visit: Payer: Medicare Other | Admitting: Oncology

## 2016-12-25 ENCOUNTER — Other Ambulatory Visit (HOSPITAL_COMMUNITY): Payer: Self-pay | Admitting: Internal Medicine

## 2016-12-25 DIAGNOSIS — R0902 Hypoxemia: Secondary | ICD-10-CM

## 2016-12-26 ENCOUNTER — Other Ambulatory Visit (HOSPITAL_COMMUNITY): Payer: Self-pay | Admitting: Internal Medicine

## 2016-12-26 DIAGNOSIS — J9691 Respiratory failure, unspecified with hypoxia: Secondary | ICD-10-CM

## 2017-01-01 ENCOUNTER — Encounter (HOSPITAL_COMMUNITY): Payer: Self-pay

## 2017-01-01 ENCOUNTER — Ambulatory Visit (HOSPITAL_COMMUNITY)
Admission: RE | Admit: 2017-01-01 | Discharge: 2017-01-01 | Disposition: A | Discharge status: Home / Self Care | Payer: Medicare Other | Source: Ambulatory Visit | Attending: Internal Medicine | Admitting: Internal Medicine

## 2017-01-07 DIAGNOSIS — I5189 Other ill-defined heart diseases: Secondary | ICD-10-CM | POA: Insufficient documentation

## 2017-10-17 ENCOUNTER — Ambulatory Visit (INDEPENDENT_AMBULATORY_CARE_PROVIDER_SITE_OTHER): Payer: Medicare Other | Admitting: Orthopaedic Surgery

## 2017-10-17 ENCOUNTER — Encounter (INDEPENDENT_AMBULATORY_CARE_PROVIDER_SITE_OTHER): Payer: Self-pay | Admitting: Orthopaedic Surgery

## 2017-10-17 ENCOUNTER — Ambulatory Visit (INDEPENDENT_AMBULATORY_CARE_PROVIDER_SITE_OTHER): Payer: Medicare Other

## 2017-10-17 VITALS — BP 151/98 | HR 82 | Resp 16 | Ht 76.75 in | Wt 266.0 lb

## 2017-10-17 DIAGNOSIS — M25532 Pain in left wrist: Secondary | ICD-10-CM

## 2017-10-17 NOTE — Progress Notes (Signed)
Office Visit Note   Patient: [name]           Date of Birth: [birth year]           MRN: [id number] Visit Date: 10/17/2017              Requested by: Charlsie Merles, [place] [facility] [place], [place] [address] PCP: Charlsie Merles, MD   Assessment & Plan: Visit Diagnoses:  1. Pain in left wrist     Plan: 2 days post injury to left hand and wrist with multiple x-rays at nursing facility possibly consistent with an avulsion fracture. Was today did not demonstrate an acute injury. We will apply a volar wrist splint for pain and reevaluate in 2 weeks. He might have an occult fracture or possibly soft tissue injury  Follow-Up Instructions: Return in about 2 weeks (around 10/31/2017).   Orders:  Orders Placed This Encounter  Procedures  . XR Wrist Complete Left   No orders of the defined types were placed in this encounter.     Procedures: No procedures performed   Clinical Data: No additional findings.   Subjective: Chief Complaint  Patient presents with  . Left Wrist - Injury, Pain, Edema    [name] is a 62 y o here today for a possible avulsion fx of left wrist/hand. Pt had portable XR at [facility] assisted living, fell getting to toilet on Wednesday night.  62 year old diabetic presently at [facility] assisted living facility with multiple issues related to his diabetes. He is being treated for ulcers in both lower extremities. [name] fell in the bathroom 2 days ago complaining of pain in the dorsum of his left hand. Portable films were obtained and possibly consistent with a fracture. Seen today for follow-up orthopedic evaluation. Still complaining of pain predominantly on the dorsum of his left hand  HPI  Review of Systems  Constitutional: Negative for fatigue.  HENT: Negative for hearing loss.   Respiratory: Positive for apnea and shortness of breath. Negative for chest tightness.   Cardiovascular: Negative for chest pain,  palpitations and leg swelling.  Gastrointestinal: Negative for blood in stool, constipation and diarrhea.  Genitourinary: Positive for frequency. Negative for difficulty urinating.  Musculoskeletal: Positive for arthralgias and joint swelling. Negative for back pain, myalgias, neck pain and neck stiffness.  Neurological: Positive for speech difficulty and weakness. Negative for numbness and headaches.  Hematological: Does not bruise/bleed easily.  Psychiatric/Behavioral: Negative for sleep disturbance. The patient is not nervous/anxious.      Objective: Vital Signs: BP (!) 151/98   Pulse 82   Resp 16   Ht 6' 4.75" (1.949 m)   Wt 266 lb (120.7 kg)   BMI 31.75 kg/m   Physical Exam  Ortho Exam evaluated in a wheelchair. Dorsum of left hand was edematous. Skin intact. Multiple areas of tenderness. No pain at the distal radius or ulna. No obvious fracture. Able to make a full fist with some tenderness along the dorsum of his hand. He had active motors both flexion and extension of the digits. Skin intact. Good capillary refill to fingers  Specialty Comments:  No specialty comments available.  Imaging: Xr Wrist Complete Left  Result Date: 10/17/2017 No evidence of acute injury or fracture. Some evidence of degenerative change within the carpus with subchondral cyst formation but joint space is well-maintained. No evidence of dislocation or subluxation    PMFS History: Patient Active Problem List   Diagnosis Date Noted  .  Acute diastolic heart failure ([place]) 08/25/2016  . Hypoxia 08/21/2016  . Acute respiratory failure with hypoxia and hypercapnia ([place]) 08/21/2016  . Moderate protein-calorie malnutrition ([place]) 08/21/2016  . Stasis edema of both lower extremities 08/21/2016  . Chronic pain 08/21/2016  . AKI (acute kidney injury) ([place]) 11/02/2015  . Hyperkalemia 11/02/2015  . Hypoglycemia 11/02/2015  . Cellulitis   . Leg ulcer ([place])   . Nonhealing ulcer of left lower extremity  ([place])   . Pain of left leg   . Cellulitis of leg 04/25/2015  . Degenerative arthritis of hip 04/25/2015  . Cellulitis and abscess of leg   . Microcytic anemia 03/27/2015  . Hypertension   . Fever   . DM (diabetes mellitus), secondary, uncontrolled, with peripheral vascular complications ([place]) 46/50/3546  . MSSA (methicillin susceptible Staphylococcus aureus) infection 03/20/2015  . Leukocytosis 03/20/2015  . Anemia, iron deficiency 03/20/2015  . Cellulitis of left lower extremity   . Pyomyositis   . Sepsis ([place]) 03/14/2015   Past Medical History:  Diagnosis Date  . Anemia   . Arthritis    "left hip" (04/26/2015)  . Cellulitis and abscess of leg hositalized 04/25/2015   left  . Chronic hip pain   . Depression   . GERD (gastroesophageal reflux disease)   . Hypercholesterolemia   . Hypertension   . Pneumonia 1960's X 1  . Sleep apnea    "couldn't wear the mask" (04/26/2015)  . Thalassemia minor   . Type II diabetes mellitus (HCC)     Family History  Problem Relation Age of Onset  . Diabetes Mellitus II Mother   . Diabetes Mellitus II Father   . Diabetes Mellitus II Brother     Past Surgical History:  Procedure Laterality Date  . INGUINAL HERNIA REPAIR Right ~ 2004  . SHOULDER ARTHROSCOPY W/ ROTATOR CUFF REPAIR Right ~ 2004  . TOENAIL EXCISION Right 1980's X 2   "big toe"   Social History   Occupational History  . Not on file  Tobacco Use  . Smoking status: Current Every Day Smoker    Packs/day: 0.12    Years: 45.00    Pack years: 5.40    Types: Cigarettes  . Smokeless tobacco: Never Used  Substance and Sexual Activity  . Alcohol use: No    Frequency: Never    Comment: 04/26/2015 "I may drink 1/2 glass of wine or a couple beers 1-2 times/month; if that"  . Drug use: No    Comment: "stopped all drug use in 2005; S/P SARP program in [place]"  . Sexual activity: No

## 2017-11-03 ENCOUNTER — Ambulatory Visit (INDEPENDENT_AMBULATORY_CARE_PROVIDER_SITE_OTHER): Payer: Medicare Other | Admitting: Orthopaedic Surgery

## 2017-11-03 ENCOUNTER — Encounter (INDEPENDENT_AMBULATORY_CARE_PROVIDER_SITE_OTHER): Payer: Self-pay | Admitting: Orthopaedic Surgery

## 2017-11-03 VITALS — Resp 16 | Ht 76.75 in | Wt 266.0 lb

## 2017-11-03 DIAGNOSIS — M25532 Pain in left wrist: Secondary | ICD-10-CM | POA: Diagnosis not present

## 2017-11-03 NOTE — Progress Notes (Signed)
Office Visit Note   Patient: [name]           Date of Birth: 28-Jul-1956           MRN: [id number] Visit Date: 11/03/2017              Requested by: Charlsie Merles, [place] [facility] [place], [place] [address] PCP: Charlsie Merles, MD   Assessment & Plan: Visit Diagnoses:  1. Pain in left wrist     Plan: Comfortable in the volar wrist splint without any appreciable pain. No localized tenderness. Would suggest he continue with the volar wrist splint for the next 2-3 weeks with activities as tolerated. We'll check back in 3 weeks and consider new films are still having a problem.  Follow-Up Instructions: Return in about 3 weeks (around 11/24/2017).   Orders:  No orders of the defined types were placed in this encounter.  No orders of the defined types were placed in this encounter.     Procedures: No procedures performed   Clinical Data: No additional findings.   Subjective: Chief Complaint  Patient presents with  . Left Wrist - Pain    [name] is a 62 y o here today for Left wrist pain, avulsion fx. He relates the wrist feel much better.  Feeling better in the volar wrist splint. Has tried removing the splint on occasion with "good results" no related redness or swelling.  HPI  Review of Systems  Constitutional: Negative for fatigue.  HENT: Negative for hearing loss.   Respiratory: Negative for apnea, chest tightness and shortness of breath.   Cardiovascular: Negative for chest pain, palpitations and leg swelling.  Gastrointestinal: Negative for blood in stool, constipation and diarrhea.  Genitourinary: Positive for urgency. Negative for difficulty urinating.  Musculoskeletal: Positive for back pain, joint swelling and myalgias. Negative for arthralgias, neck pain and neck stiffness.  Neurological: Positive for weakness. Negative for numbness and headaches.  Hematological: Does not bruise/bleed easily.  Psychiatric/Behavioral: Negative  for sleep disturbance. The patient is not nervous/anxious.      Objective: Vital Signs: Resp 16   Ht 6' 4.75" (1.949 m)   Wt 266 lb (120.7 kg)   BMI 31.75 kg/m   Physical Exam  Ortho Exam volar wrist splint left hand and wrist removed. No swelling or redness. No ecchymosis. Good grip and good release. Good capillary refill to fingers. No localized areas of tenderness.  Specialty Comments:  No specialty comments available.  Imaging: No results found.   PMFS History: Patient Active Problem List   Diagnosis Date Noted  . Pain in left wrist 11/03/2017  . Acute diastolic heart failure ([place]) 08/25/2016  . Hypoxia 08/21/2016  . Acute respiratory failure with hypoxia and hypercapnia ([place]) 08/21/2016  . Moderate protein-calorie malnutrition ([place]) 08/21/2016  . Stasis edema of both lower extremities 08/21/2016  . Chronic pain 08/21/2016  . AKI (acute kidney injury) ([place]) 11/02/2015  . Hyperkalemia 11/02/2015  . Hypoglycemia 11/02/2015  . Cellulitis   . Leg ulcer ([place])   . Nonhealing ulcer of left lower extremity ([place])   . Pain of left leg   . Cellulitis of leg 04/25/2015  . Degenerative arthritis of hip 04/25/2015  . Cellulitis and abscess of leg   . Microcytic anemia 03/27/2015  . Hypertension   . Fever   . DM (diabetes mellitus), secondary, uncontrolled, with peripheral vascular complications ([place]) 62/69/4854  . MSSA (methicillin susceptible Staphylococcus aureus) infection 03/20/2015  . Leukocytosis 03/20/2015  .  Anemia, iron deficiency 03/20/2015  . Cellulitis of left lower extremity   . Pyomyositis   . Sepsis ([place]) 03/14/2015   Past Medical History:  Diagnosis Date  . Anemia   . Arthritis    "left hip" (04/26/2015)  . Cellulitis and abscess of leg hositalized 04/25/2015   left  . Chronic hip pain   . Depression   . GERD (gastroesophageal reflux disease)   . Hypercholesterolemia   . Hypertension   . Pneumonia 1960's X 1  . Sleep apnea    "couldn't wear the  mask" (04/26/2015)  . Thalassemia minor   . Type II diabetes mellitus (HCC)     Family History  Problem Relation Age of Onset  . Diabetes Mellitus II Mother   . Diabetes Mellitus II Father   . Diabetes Mellitus II Brother     Past Surgical History:  Procedure Laterality Date  . INGUINAL HERNIA REPAIR Right ~ 2004  . SHOULDER ARTHROSCOPY W/ ROTATOR CUFF REPAIR Right ~ 2004  . TOENAIL EXCISION Right 1980's X 2   "big toe"   Social History   Occupational History  . Not on file  Tobacco Use  . Smoking status: Current Every Day Smoker    Packs/day: 0.12    Years: 45.00    Pack years: 5.40    Types: Cigarettes  . Smokeless tobacco: Never Used  Substance and Sexual Activity  . Alcohol use: No    Frequency: Never    Comment: 04/26/2015 "I may drink 1/2 glass of wine or a couple beers 1-2 times/month; if that"  . Drug use: No    Comment: "stopped all drug use in 2005; S/P SARP program in [place]"  . Sexual activity: No

## 2017-11-24 ENCOUNTER — Ambulatory Visit (INDEPENDENT_AMBULATORY_CARE_PROVIDER_SITE_OTHER): Payer: Medicare Other | Admitting: Orthopaedic Surgery

## 2017-11-28 ENCOUNTER — Ambulatory Visit (INDEPENDENT_AMBULATORY_CARE_PROVIDER_SITE_OTHER): Payer: Medicare Other | Admitting: Orthopaedic Surgery

## 2017-11-28 ENCOUNTER — Ambulatory Visit (INDEPENDENT_AMBULATORY_CARE_PROVIDER_SITE_OTHER): Payer: Medicare Other

## 2017-11-28 ENCOUNTER — Encounter (INDEPENDENT_AMBULATORY_CARE_PROVIDER_SITE_OTHER): Payer: Self-pay | Admitting: Orthopaedic Surgery

## 2017-11-28 VITALS — BP 147/74 | HR 82 | Resp 20 | Ht 76.5 in | Wt 272.6 lb

## 2017-11-28 DIAGNOSIS — M25532 Pain in left wrist: Secondary | ICD-10-CM

## 2017-11-28 NOTE — Progress Notes (Signed)
Office Visit Note   Patient: [name]           Date of Birth: [birth year]           MRN: [id number] Visit Date: 11/28/2017              Requested by: Charlsie Merles, [place] [facility] [place], [place] [address] PCP: Charlsie Merles, MD   Assessment & Plan: Visit Diagnoses:  1. Pain in left wrist     Plan: 6 weeks post left hand and wrist injury. Her films to my reading were negative. Has been in a volar wrist splint and notes she's feeling much better. Films negative today for any injury. May wear a volar wrist splint for comfort and we'll plan to see back as needed  Follow-Up Instructions: Return if symptoms worsen or fail to improve.   Orders:  Orders Placed This Encounter  Procedures  . XR Wrist Complete Left   No orders of the defined types were placed in this encounter.     Procedures: No procedures performed   Clinical Data: No additional findings.   Subjective: Chief Complaint  Patient presents with  . Left Wrist - Follow-up  . Follow-up    Left wrist pain, improving  6 weeks post injury. Wearing a volar wrist splint. Quite comfortable. Some pain with motion out of the splint. No obvious swelling or numbness or tingling.  HPI  Review of Systems   Objective: Vital Signs: BP (!) 147/74 (BP Location: Left Arm, Patient Position: Sitting, Cuff Size: Normal)   Pulse 82   Resp 20   Ht 6' 4.5" (1.943 m)   Wt 272 lb 9.6 oz (123.7 kg)   BMI 32.75 kg/m   Physical Exam  Ortho Exam left wrist examined out of the volar wrist splint. Some tenderness over the distal radius and ulna. No deformity. Had full pronation supination flexion and extension. Some pain at the wrist with flexion and extension but no skin change or erythema or ecchymosis. Good grip and good release. No swelling of any of the digits. Good capillary refill.  Specialty Comments:  No specialty comments available.  Imaging: No results found.   PMFS  History: Patient Active Problem List   Diagnosis Date Noted  . Pain in left wrist 11/03/2017  . Acute diastolic heart failure ([place]) 08/25/2016  . Hypoxia 08/21/2016  . Acute respiratory failure with hypoxia and hypercapnia ([place]) 08/21/2016  . Moderate protein-calorie malnutrition ([place]) 08/21/2016  . Stasis edema of both lower extremities 08/21/2016  . Chronic pain 08/21/2016  . AKI (acute kidney injury) ([place]) 11/02/2015  . Hyperkalemia 11/02/2015  . Hypoglycemia 11/02/2015  . Cellulitis   . Leg ulcer ([place])   . Nonhealing ulcer of left lower extremity ([place])   . Pain of left leg   . Cellulitis of leg 04/25/2015  . Degenerative arthritis of hip 04/25/2015  . Cellulitis and abscess of leg   . Microcytic anemia 03/27/2015  . Hypertension   . Fever   . DM (diabetes mellitus), secondary, uncontrolled, with peripheral vascular complications ([place]) 12/45/8099  . MSSA (methicillin susceptible Staphylococcus aureus) infection 03/20/2015  . Leukocytosis 03/20/2015  . Anemia, iron deficiency 03/20/2015  . Cellulitis of left lower extremity   . Pyomyositis   . Sepsis ([place]) 03/14/2015   Past Medical History:  Diagnosis Date  . Anemia   . Arthritis    "left hip" (04/26/2015)  . Cellulitis and abscess of leg hositalized 04/25/2015   left  .  Chronic hip pain   . Depression   . GERD (gastroesophageal reflux disease)   . Hypercholesterolemia   . Hypertension   . Pneumonia 1960's X 1  . Sleep apnea    "couldn't wear the mask" (04/26/2015)  . Thalassemia minor   . Type II diabetes mellitus (HCC)     Family History  Problem Relation Age of Onset  . Diabetes Mellitus II Mother   . Diabetes Mellitus II Father   . Diabetes Mellitus II Brother     Past Surgical History:  Procedure Laterality Date  . INGUINAL HERNIA REPAIR Right ~ 2004  . SHOULDER ARTHROSCOPY W/ ROTATOR CUFF REPAIR Right ~ 2004  . TOENAIL EXCISION Right 1980's X 2   "big toe"   Social History   Occupational History  .  Not on file  Tobacco Use  . Smoking status: Current Every Day Smoker    Packs/day: 0.12    Years: 45.00    Pack years: 5.40    Types: Cigarettes  . Smokeless tobacco: Never Used  Substance and Sexual Activity  . Alcohol use: No    Frequency: Never    Comment: 04/26/2015 "I may drink 1/2 glass of wine or a couple beers 1-2 times/month; if that"  . Drug use: No    Comment: "stopped all drug use in 2005; S/P SARP program in [place]"  . Sexual activity: No     Garald Balding, MD   Note - This record has been created using [employer].  Chart creation errors have been sought, but may not always  have been located. Such creation errors do not reflect on  the standard of medical care.

## 2018-05-05 ENCOUNTER — Encounter (HOSPITAL_BASED_OUTPATIENT_CLINIC_OR_DEPARTMENT_OTHER): Payer: Medicare Other | Attending: Internal Medicine

## 2018-05-05 DIAGNOSIS — E1140 Type 2 diabetes mellitus with diabetic neuropathy, unspecified: Secondary | ICD-10-CM | POA: Insufficient documentation

## 2018-05-05 DIAGNOSIS — L97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ed: Secondary | ICD-10-CM | POA: Insufficient documentation

## 2018-05-05 DIAGNOSIS — I110 Hypertensive heart disease with heart failure: Secondary | ICD-10-CM | POA: Diagnosis not present

## 2018-05-05 DIAGNOSIS — L97812 Non-pressure chronic ulcer of other part of right lower leg with fat layer exposed: Secondary | ICD-10-CM | POA: Insufficient documentation

## 2018-05-05 DIAGNOSIS — I509 Heart failure, unspecified: Secondary | ICD-10-CM | POA: Insufficient documentation

## 2018-05-05 DIAGNOSIS — F17210 Nicotine dependence, cigarettes, uncomplicated: Secondary | ICD-10-CM | POA: Diagnosis not present

## 2018-05-05 DIAGNOSIS — I890 Lymphedema, not elsewhere classified: Secondary | ICD-10-CM | POA: Insufficient documentation

## 2018-05-05 DIAGNOSIS — G4730 Sleep apnea, unspecified: Secondary | ICD-10-CM | POA: Insufficient documentation

## 2018-05-05 DIAGNOSIS — Z794 Long term (current) use of insulin: Secondary | ICD-10-CM | POA: Diagnosis not present

## 2018-05-05 DIAGNOSIS — I872 Venous insufficiency (chronic) (peripheral): Secondary | ICD-10-CM | POA: Diagnosis not present

## 2018-05-05 DIAGNOSIS — L97222 Non-pressure chronic ulcer of left calf with fat layer exposed: Secondary | ICD-10-CM | POA: Diagnosis not present

## 2018-05-07 ENCOUNTER — Telehealth (HOSPITAL_COMMUNITY): Payer: Self-pay | Admitting: Surgery

## 2018-05-07 NOTE — Telephone Encounter (Signed)
Follow-up phone call made to [facility] [phone]) to arrange a LE Venous reflux u/s as ordered by Jeri Cos, PA- I was transferred to [facility] - rings without an option to leave a message.

## 2018-05-07 NOTE — Telephone Encounter (Signed)
Rec'd an order from [facility] [place] to arrange a Venous Duplex (reflux exam bilateral).   I called [facility] [phone]) had to leave a voicemail asking for someone to call Rip Harbour at [employer] to arrange an appointment.

## 2018-05-11 ENCOUNTER — Telehealth (HOSPITAL_COMMUNITY): Payer: Self-pay | Admitting: Surgery

## 2018-05-11 NOTE — Telephone Encounter (Signed)
Called [facility] [phone]) to arrange an appointment requested by Jeri Cos, PA.  I was transferred to the [facility].  The phone rings without an option to leave a message.

## 2018-05-15 ENCOUNTER — Ambulatory Visit (HOSPITAL_COMMUNITY)
Admission: RE | Admit: 2018-05-15 | Discharge: 2018-05-15 | Disposition: A | Discharge status: Home / Self Care | Payer: Medicare Other | Source: Ambulatory Visit | Attending: Vascular Surgery | Admitting: Vascular Surgery

## 2018-05-15 ENCOUNTER — Encounter (HOSPITAL_COMMUNITY): Payer: Self-pay

## 2018-05-15 ENCOUNTER — Other Ambulatory Visit: Payer: Self-pay | Admitting: Physician Assistant

## 2018-05-15 DIAGNOSIS — L97909 Non-pressure chronic ulcer of unspecified part of unspecified lower leg with unspecified severity: Secondary | ICD-10-CM

## 2018-06-23 ENCOUNTER — Encounter (HOSPITAL_BASED_OUTPATIENT_CLINIC_OR_DEPARTMENT_OTHER): Payer: Medicare Other | Attending: Internal Medicine

## 2018-06-23 DIAGNOSIS — Z794 Long term (current) use of insulin: Secondary | ICD-10-CM | POA: Insufficient documentation

## 2018-06-23 DIAGNOSIS — E1140 Type 2 diabetes mellitus with diabetic neuropathy, unspecified: Secondary | ICD-10-CM | POA: Insufficient documentation

## 2018-06-23 DIAGNOSIS — L97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ed: Secondary | ICD-10-CM | POA: Insufficient documentation

## 2018-06-23 DIAGNOSIS — L97812 Non-pressure chronic ulcer of other part of right lower leg with fat layer exposed: Secondary | ICD-10-CM | POA: Insufficient documentation

## 2018-06-23 DIAGNOSIS — I110 Hypertensive heart disease with heart failure: Secondary | ICD-10-CM | POA: Insufficient documentation

## 2018-06-23 DIAGNOSIS — I872 Venous insufficiency (chronic) (peripheral): Secondary | ICD-10-CM | POA: Insufficient documentation

## 2018-06-23 DIAGNOSIS — G4730 Sleep apnea, unspecified: Secondary | ICD-10-CM | POA: Insufficient documentation

## 2018-06-23 DIAGNOSIS — F17210 Nicotine dependence, cigarettes, uncomplicated: Secondary | ICD-10-CM | POA: Insufficient documentation

## 2018-06-23 DIAGNOSIS — E1151 Type 2 diabetes mellitus with diabetic peripheral angiopathy without gangrene: Secondary | ICD-10-CM | POA: Insufficient documentation

## 2018-06-23 DIAGNOSIS — I890 Lymphedema, not elsewhere classified: Secondary | ICD-10-CM | POA: Insufficient documentation

## 2018-06-23 DIAGNOSIS — I509 Heart failure, unspecified: Secondary | ICD-10-CM | POA: Insufficient documentation

## 2018-06-30 DIAGNOSIS — L97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ed: Secondary | ICD-10-CM | POA: Diagnosis not present

## 2018-06-30 DIAGNOSIS — F17210 Nicotine dependence, cigarettes, uncomplicated: Secondary | ICD-10-CM | POA: Diagnosis not present

## 2018-06-30 DIAGNOSIS — E1140 Type 2 diabetes mellitus with diabetic neuropathy, unspecified: Secondary | ICD-10-CM | POA: Diagnosis not present

## 2018-06-30 DIAGNOSIS — Z794 Long term (current) use of insulin: Secondary | ICD-10-CM | POA: Diagnosis not present

## 2018-06-30 DIAGNOSIS — G4730 Sleep apnea, unspecified: Secondary | ICD-10-CM | POA: Diagnosis not present

## 2018-06-30 DIAGNOSIS — I509 Heart failure, unspecified: Secondary | ICD-10-CM | POA: Diagnosis not present

## 2018-06-30 DIAGNOSIS — I890 Lymphedema, not elsewhere classified: Secondary | ICD-10-CM | POA: Diagnosis not present

## 2018-06-30 DIAGNOSIS — I110 Hypertensive heart disease with heart failure: Secondary | ICD-10-CM | POA: Diagnosis not present

## 2018-06-30 DIAGNOSIS — I872 Venous insufficiency (chronic) (peripheral): Secondary | ICD-10-CM | POA: Diagnosis not present

## 2018-06-30 DIAGNOSIS — E1151 Type 2 diabetes mellitus with diabetic peripheral angiopathy without gangrene: Secondary | ICD-10-CM | POA: Diagnosis not present

## 2018-06-30 DIAGNOSIS — L97812 Non-pressure chronic ulcer of other part of right lower leg with fat layer exposed: Secondary | ICD-10-CM | POA: Diagnosis present

## 2018-07-14 DIAGNOSIS — L97812 Non-pressure chronic ulcer of other part of right lower leg with fat layer exposed: Secondary | ICD-10-CM | POA: Diagnosis not present

## 2018-08-14 ENCOUNTER — Encounter (HOSPITAL_BASED_OUTPATIENT_CLINIC_OR_DEPARTMENT_OTHER): Payer: Medicare Other | Attending: Internal Medicine

## 2018-08-14 DIAGNOSIS — E1140 Type 2 diabetes mellitus with diabetic neuropathy, unspecified: Secondary | ICD-10-CM | POA: Insufficient documentation

## 2018-08-14 DIAGNOSIS — L97812 Non-pressure chronic ulcer of other part of right lower leg with fat layer exposed: Secondary | ICD-10-CM | POA: Insufficient documentation

## 2018-08-14 DIAGNOSIS — E1151 Type 2 diabetes mellitus with diabetic peripheral angiopathy without gangrene: Secondary | ICD-10-CM | POA: Insufficient documentation

## 2018-08-14 DIAGNOSIS — I890 Lymphedema, not elsewhere classified: Secondary | ICD-10-CM | POA: Diagnosis not present

## 2018-08-14 DIAGNOSIS — I110 Hypertensive heart disease with heart failure: Secondary | ICD-10-CM | POA: Insufficient documentation

## 2018-08-14 DIAGNOSIS — I872 Venous insufficiency (chronic) (peripheral): Secondary | ICD-10-CM | POA: Insufficient documentation

## 2018-08-14 DIAGNOSIS — G4730 Sleep apnea, unspecified: Secondary | ICD-10-CM | POA: Diagnosis not present

## 2018-08-14 DIAGNOSIS — L97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ed: Secondary | ICD-10-CM | POA: Diagnosis not present

## 2018-08-14 DIAGNOSIS — I509 Heart failure, unspecified: Secondary | ICD-10-CM | POA: Diagnosis not present

## 2018-11-04 ENCOUNTER — Encounter (HOSPITAL_BASED_OUTPATIENT_CLINIC_OR_DEPARTMENT_OTHER): Payer: Medicare Other | Attending: Internal Medicine

## 2018-11-04 DIAGNOSIS — I509 Heart failure, unspecified: Secondary | ICD-10-CM | POA: Diagnosis not present

## 2018-11-04 DIAGNOSIS — Z794 Long term (current) use of insulin: Secondary | ICD-10-CM | POA: Diagnosis not present

## 2018-11-04 DIAGNOSIS — E1140 Type 2 diabetes mellitus with diabetic neuropathy, unspecified: Secondary | ICD-10-CM | POA: Diagnosis not present

## 2018-11-04 DIAGNOSIS — I890 Lymphedema, not elsewhere classified: Secondary | ICD-10-CM | POA: Insufficient documentation

## 2018-11-04 DIAGNOSIS — G4730 Sleep apnea, unspecified: Secondary | ICD-10-CM | POA: Insufficient documentation

## 2018-11-04 DIAGNOSIS — I110 Hypertensive heart disease with heart failure: Secondary | ICD-10-CM | POA: Insufficient documentation

## 2018-11-04 DIAGNOSIS — L97812 Non-pressure chronic ulcer of other part of right lower leg with fat layer exposed: Secondary | ICD-10-CM | POA: Insufficient documentation

## 2018-11-04 DIAGNOSIS — F17210 Nicotine dependence, cigarettes, uncomplicated: Secondary | ICD-10-CM | POA: Insufficient documentation

## 2018-11-04 DIAGNOSIS — L97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ed: Secondary | ICD-10-CM | POA: Diagnosis not present

## 2018-11-04 DIAGNOSIS — I872 Venous insufficiency (chronic) (peripheral): Secondary | ICD-10-CM | POA: Insufficient documentation

## 2018-11-04 LAB — GLUCOSE, CAPILLARY
GLUCOSE-CAPILLARY: 37 mg/dL — AB (ref 70–99)
Glucose-Capillary: 72 mg/dL (ref 70–99)

## 2018-12-02 ENCOUNTER — Encounter (HOSPITAL_BASED_OUTPATIENT_CLINIC_OR_DEPARTMENT_OTHER): Payer: Medicare Other | Attending: Internal Medicine

## 2019-02-12 ENCOUNTER — Other Ambulatory Visit: Payer: Self-pay

## 2019-02-12 ENCOUNTER — Emergency Department (HOSPITAL_COMMUNITY): Payer: Medicare Other

## 2019-02-12 ENCOUNTER — Inpatient Hospital Stay (HOSPITAL_COMMUNITY)
Admission: EM | Admit: 2019-02-12 | Discharge: 2019-02-22 | DRG: 871 | Disposition: A | Discharge status: Skilled Nursing Facility | Payer: Medicare Other | Attending: Internal Medicine | Admitting: Internal Medicine

## 2019-02-12 ENCOUNTER — Encounter (HOSPITAL_COMMUNITY): Payer: Self-pay

## 2019-02-12 DIAGNOSIS — E119 Type 2 diabetes mellitus without complications: Secondary | ICD-10-CM | POA: Diagnosis not present

## 2019-02-12 DIAGNOSIS — A4189 Other specified sepsis: Secondary | ICD-10-CM | POA: Diagnosis not present

## 2019-02-12 DIAGNOSIS — R0603 Acute respiratory distress: Secondary | ICD-10-CM

## 2019-02-12 DIAGNOSIS — N400 Enlarged prostate without lower urinary tract symptoms: Secondary | ICD-10-CM | POA: Diagnosis not present

## 2019-02-12 DIAGNOSIS — J9811 Atelectasis: Secondary | ICD-10-CM | POA: Diagnosis present

## 2019-02-12 DIAGNOSIS — Z7982 Long term (current) use of aspirin: Secondary | ICD-10-CM

## 2019-02-12 DIAGNOSIS — Z79899 Other long term (current) drug therapy: Secondary | ICD-10-CM

## 2019-02-12 DIAGNOSIS — E11649 Type 2 diabetes mellitus with hypoglycemia without coma: Secondary | ICD-10-CM | POA: Diagnosis not present

## 2019-02-12 DIAGNOSIS — U071 COVID-19: Secondary | ICD-10-CM

## 2019-02-12 DIAGNOSIS — E1351 Other specified diabetes mellitus with diabetic peripheral angiopathy without gangrene: Secondary | ICD-10-CM

## 2019-02-12 DIAGNOSIS — E1365 Other specified diabetes mellitus with hyperglycemia: Secondary | ICD-10-CM | POA: Diagnosis not present

## 2019-02-12 DIAGNOSIS — R0600 Dyspnea, unspecified: Secondary | ICD-10-CM | POA: Diagnosis present

## 2019-02-12 DIAGNOSIS — M25559 Pain in unspecified hip: Secondary | ICD-10-CM | POA: Diagnosis present

## 2019-02-12 DIAGNOSIS — Z23 Encounter for immunization: Secondary | ICD-10-CM | POA: Diagnosis not present

## 2019-02-12 DIAGNOSIS — L03115 Cellulitis of right lower limb: Secondary | ICD-10-CM | POA: Diagnosis not present

## 2019-02-12 DIAGNOSIS — G894 Chronic pain syndrome: Secondary | ICD-10-CM | POA: Diagnosis not present

## 2019-02-12 DIAGNOSIS — E785 Hyperlipidemia, unspecified: Secondary | ICD-10-CM | POA: Diagnosis present

## 2019-02-12 DIAGNOSIS — I5033 Acute on chronic diastolic (congestive) heart failure: Secondary | ICD-10-CM | POA: Diagnosis present

## 2019-02-12 DIAGNOSIS — K219 Gastro-esophageal reflux disease without esophagitis: Secondary | ICD-10-CM | POA: Diagnosis not present

## 2019-02-12 DIAGNOSIS — I110 Hypertensive heart disease with heart failure: Secondary | ICD-10-CM | POA: Diagnosis not present

## 2019-02-12 DIAGNOSIS — F17210 Nicotine dependence, cigarettes, uncomplicated: Secondary | ICD-10-CM | POA: Diagnosis present

## 2019-02-12 DIAGNOSIS — L97919 Non-pressure chronic ulcer of unspecified part of right lower leg with unspecified severity: Secondary | ICD-10-CM | POA: Diagnosis not present

## 2019-02-12 DIAGNOSIS — Z993 Dependence on wheelchair: Secondary | ICD-10-CM

## 2019-02-12 DIAGNOSIS — J90 Pleural effusion, not elsewhere classified: Secondary | ICD-10-CM | POA: Diagnosis not present

## 2019-02-12 DIAGNOSIS — I5041 Acute combined systolic (congestive) and diastolic (congestive) heart failure: Secondary | ICD-10-CM | POA: Diagnosis not present

## 2019-02-12 DIAGNOSIS — E7800 Pure hypercholesterolemia, unspecified: Secondary | ICD-10-CM | POA: Diagnosis present

## 2019-02-12 DIAGNOSIS — D563 Thalassemia minor: Secondary | ICD-10-CM | POA: Diagnosis not present

## 2019-02-12 DIAGNOSIS — E669 Obesity, unspecified: Secondary | ICD-10-CM | POA: Diagnosis present

## 2019-02-12 DIAGNOSIS — E1169 Type 2 diabetes mellitus with other specified complication: Secondary | ICD-10-CM | POA: Diagnosis not present

## 2019-02-12 DIAGNOSIS — J40 Bronchitis, not specified as acute or chronic: Secondary | ICD-10-CM | POA: Diagnosis present

## 2019-02-12 DIAGNOSIS — J9621 Acute and chronic respiratory failure with hypoxia: Secondary | ICD-10-CM | POA: Diagnosis not present

## 2019-02-12 DIAGNOSIS — R0602 Shortness of breath: Secondary | ICD-10-CM

## 2019-02-12 DIAGNOSIS — F329 Major depressive disorder, single episode, unspecified: Secondary | ICD-10-CM | POA: Diagnosis not present

## 2019-02-12 DIAGNOSIS — R0902 Hypoxemia: Secondary | ICD-10-CM | POA: Diagnosis present

## 2019-02-12 DIAGNOSIS — E876 Hypokalemia: Secondary | ICD-10-CM | POA: Diagnosis not present

## 2019-02-12 DIAGNOSIS — I5031 Acute diastolic (congestive) heart failure: Secondary | ICD-10-CM | POA: Diagnosis not present

## 2019-02-12 DIAGNOSIS — Z6836 Body mass index (BMI) 36.0-36.9, adult: Secondary | ICD-10-CM

## 2019-02-12 DIAGNOSIS — IMO0002 Reserved for concepts with insufficient information to code with codable children: Secondary | ICD-10-CM

## 2019-02-12 DIAGNOSIS — Z794 Long term (current) use of insulin: Secondary | ICD-10-CM

## 2019-02-12 DIAGNOSIS — Z6835 Body mass index (BMI) 35.0-35.9, adult: Secondary | ICD-10-CM

## 2019-02-12 DIAGNOSIS — Z9981 Dependence on supplemental oxygen: Secondary | ICD-10-CM

## 2019-02-12 DIAGNOSIS — Z833 Family history of diabetes mellitus: Secondary | ICD-10-CM

## 2019-02-12 LAB — CBC WITH DIFFERENTIAL/PLATELET
Abs Immature Granulocytes: 0.02 10*3/uL (ref 0.00–0.07)
Basophils Absolute: 0 10*3/uL (ref 0.0–0.1)
Basophils Relative: 0 %
Eosinophils Absolute: 0 10*3/uL (ref 0.0–0.5)
Eosinophils Relative: 0 %
HCT: 39 % (ref 39.0–52.0)
Hemoglobin: 11.1 g/dL — ABNORMAL LOW (ref 13.0–17.0)
Immature Granulocytes: 0 %
Lymphocytes Relative: 25 %
Lymphs Abs: 1.4 10*3/uL (ref 0.7–4.0)
MCH: 19 pg — ABNORMAL LOW (ref 26.0–34.0)
MCHC: 28.5 g/dL — ABNORMAL LOW (ref 30.0–36.0)
MCV: 66.7 fL — ABNORMAL LOW (ref 80.0–100.0)
Monocytes Absolute: 0.3 10*3/uL (ref 0.1–1.0)
Monocytes Relative: 5 %
Neutro Abs: 3.8 10*3/uL (ref 1.7–7.7)
Neutrophils Relative %: 70 %
Platelets: 155 10*3/uL (ref 150–400)
RBC: 5.85 MIL/uL — ABNORMAL HIGH (ref 4.22–5.81)
RDW: 24.1 % — ABNORMAL HIGH (ref 11.5–15.5)
WBC: 5.5 10*3/uL (ref 4.0–10.5)
nRBC: 0 % (ref 0.0–0.2)

## 2019-02-12 LAB — BLOOD GAS, VENOUS
Acid-Base Excess: 5.8 mmol/L — ABNORMAL HIGH (ref 0.0–2.0)
Bicarbonate: 35 mmol/L — ABNORMAL HIGH (ref 20.0–28.0)
O2 Saturation: 91.6 %
Patient temperature: 98.6
pCO2, Ven: 68.5 mmHg — ABNORMAL HIGH (ref 44.0–60.0)
pH, Ven: 7.329 (ref 7.250–7.430)
pO2, Ven: 78.4 mmHg — ABNORMAL HIGH (ref 32.0–45.0)

## 2019-02-12 LAB — FIBRINOGEN: Fibrinogen: 639 mg/dL — ABNORMAL HIGH (ref 210–475)

## 2019-02-12 LAB — COMPREHENSIVE METABOLIC PANEL
ALT: 21 U/L (ref 0–44)
AST: 38 U/L (ref 15–41)
Albumin: 2.7 g/dL — ABNORMAL LOW (ref 3.5–5.0)
Alkaline Phosphatase: 120 U/L (ref 38–126)
Anion gap: 9 (ref 5–15)
BUN: 29 mg/dL — ABNORMAL HIGH (ref 8–23)
CO2: 30 mmol/L (ref 22–32)
Calcium: 7.8 mg/dL — ABNORMAL LOW (ref 8.9–10.3)
Chloride: 100 mmol/L (ref 98–111)
Creatinine, Ser: 1.12 mg/dL (ref 0.61–1.24)
GFR calc Af Amer: >60 mL/min (ref >60)
GFR calc non Af Amer: >60 mL/min (ref >60)
Glucose, Bld: 194 mg/dL — ABNORMAL HIGH (ref 70–99)
Potassium: 3.9 mmol/L (ref 3.5–5.1)
Sodium: 139 mmol/L (ref 135–145)
Total Bilirubin: 0.2 mg/dL — ABNORMAL LOW (ref 0.3–1.2)
Total Protein: 7.4 g/dL (ref 6.5–8.1)

## 2019-02-12 LAB — FERRITIN: Ferritin: 99 ng/mL (ref 24–336)

## 2019-02-12 LAB — BLOOD GAS, ARTERIAL
Acid-Base Excess: 4.7 mmol/L — ABNORMAL HIGH (ref 0.0–2.0)
Bicarbonate: 32.2 mmol/L — ABNORMAL HIGH (ref 20.0–28.0)
Drawn by: 11249
O2 Content: 6 L/min
O2 Saturation: 90.2 %
Patient temperature: 98.6
pCO2 arterial: 66.9 mmHg (ref 32.0–48.0)
pH, Arterial: 7.304 — ABNORMAL LOW (ref 7.350–7.450)
pO2, Arterial: 73.1 mmHg — ABNORMAL LOW (ref 83.0–108.0)

## 2019-02-12 LAB — C-REACTIVE PROTEIN: CRP: 21.9 mg/dL — ABNORMAL HIGH (ref <1.0)

## 2019-02-12 LAB — PROCALCITONIN: Procalcitonin: 0.12 ng/mL

## 2019-02-12 LAB — LACTIC ACID, PLASMA: Lactic Acid, Venous: 0.9 mmol/L (ref 0.5–1.9)

## 2019-02-12 LAB — D-DIMER, QUANTITATIVE: D-Dimer, Quant: 0.46 ug/mL-FEU (ref 0.00–0.50)

## 2019-02-12 LAB — SARS CORONAVIRUS 2 BY RT PCR (HOSPITAL ORDER, PERFORMED IN ~~LOC~~ HOSPITAL LAB): SARS Coronavirus 2: POSITIVE — AB

## 2019-02-12 LAB — BRAIN NATRIURETIC PEPTIDE: B Natriuretic Peptide: 34.2 pg/mL (ref 0.0–100.0)

## 2019-02-12 LAB — LACTATE DEHYDROGENASE: LDH: 217 U/L — ABNORMAL HIGH (ref 98–192)

## 2019-02-12 LAB — TRIGLYCERIDES: Triglycerides: 136 mg/dL (ref <150)

## 2019-02-12 MED ORDER — ALBUTEROL SULFATE HFA 108 (90 BASE) MCG/ACT IN AERS
2.0000 | INHALATION_SPRAY | RESPIRATORY_TRACT | Status: DC | PRN
  Filled 2019-02-12 (×2): qty 6.7

## 2019-02-12 MED ORDER — METRONIDAZOLE IN NACL 5-0.79 MG/ML-% IV SOLN
500.0000 mg | Freq: Three times a day (TID) | INTRAVENOUS | Status: DC
  Administered 2019-02-12 – 2019-02-17 (×14): 500 mg via INTRAVENOUS
  Filled 2019-02-12 (×15): qty 100

## 2019-02-12 MED ORDER — ALBUTEROL SULFATE HFA 108 (90 BASE) MCG/ACT IN AERS: 2.0000 | INHALATION_SPRAY | Freq: Once | RESPIRATORY_TRACT | Status: DC

## 2019-02-12 MED ORDER — VANCOMYCIN HCL IN DEXTROSE 1-5 GM/200ML-% IV SOLN
1000.0000 mg | Freq: Two times a day (BID) | INTRAVENOUS | Status: DC
  Administered 2019-02-13 – 2019-02-15 (×5): 1000 mg via INTRAVENOUS
  Filled 2019-02-12 (×5): qty 200

## 2019-02-12 MED ORDER — AEROCHAMBER Z-STAT PLUS/MEDIUM MISC
1.0000 | Freq: Once | Status: AC
  Administered 2019-02-12: 17:00:00 1

## 2019-02-12 MED ORDER — SODIUM CHLORIDE 0.9 % IV SOLN
2.0000 g | Freq: Once | INTRAVENOUS | Status: AC
  Administered 2019-02-12: 21:00:00 2 g via INTRAVENOUS
  Filled 2019-02-12: qty 2

## 2019-02-12 MED ORDER — INSULIN ASPART 100 UNIT/ML ~~LOC~~ SOLN
0.0000 [IU] | SUBCUTANEOUS | Status: DC
  Administered 2019-02-13: 13:00:00 8 [IU] via SUBCUTANEOUS
  Administered 2019-02-13: 09:00:00 11 [IU] via SUBCUTANEOUS
  Administered 2019-02-13: 06:00:00 7 [IU] via SUBCUTANEOUS
  Administered 2019-02-13: 17:00:00 8 [IU] via SUBCUTANEOUS
  Administered 2019-02-13: 21:00:00 3 [IU] via SUBCUTANEOUS
  Administered 2019-02-14: 21:00:00 2 [IU] via SUBCUTANEOUS
  Administered 2019-02-14 (×2): 3 [IU] via SUBCUTANEOUS
  Administered 2019-02-14: 09:00:00 2 [IU] via SUBCUTANEOUS
  Administered 2019-02-15: 3 [IU] via SUBCUTANEOUS
  Administered 2019-02-15 (×2): 2 [IU] via SUBCUTANEOUS
  Administered 2019-02-16: 16:00:00 3 [IU] via SUBCUTANEOUS
  Administered 2019-02-16: 12:00:00 5 [IU] via SUBCUTANEOUS
  Administered 2019-02-17: 2 [IU] via SUBCUTANEOUS
  Administered 2019-02-17: 20:00:00 5 [IU] via SUBCUTANEOUS
  Administered 2019-02-17: 17:00:00 11 [IU] via SUBCUTANEOUS
  Administered 2019-02-17 (×2): 3 [IU] via SUBCUTANEOUS
  Administered 2019-02-17: 08:00:00 2 [IU] via SUBCUTANEOUS
  Administered 2019-02-18: 13:00:00 3 [IU] via SUBCUTANEOUS
  Administered 2019-02-18 (×3): 2 [IU] via SUBCUTANEOUS
  Administered 2019-02-18 (×2): 3 [IU] via SUBCUTANEOUS
  Administered 2019-02-18: 18:00:00 5 [IU] via SUBCUTANEOUS
  Administered 2019-02-19: 14:00:00 2 [IU] via SUBCUTANEOUS
  Administered 2019-02-19: 05:00:00 3 [IU] via SUBCUTANEOUS
  Administered 2019-02-19 (×2): 2 [IU] via SUBCUTANEOUS
  Administered 2019-02-19: 17:00:00 8 [IU] via SUBCUTANEOUS
  Administered 2019-02-20 (×4): 3 [IU] via SUBCUTANEOUS
  Administered 2019-02-20: 22:00:00 5 [IU] via SUBCUTANEOUS
  Administered 2019-02-21: 10:00:00 3 [IU] via SUBCUTANEOUS
  Administered 2019-02-21 (×2): 5 [IU] via SUBCUTANEOUS
  Administered 2019-02-21: 05:00:00 2 [IU] via SUBCUTANEOUS
  Administered 2019-02-21 (×2): 3 [IU] via SUBCUTANEOUS
  Administered 2019-02-21 – 2019-02-22 (×2): 5 [IU] via SUBCUTANEOUS
  Administered 2019-02-22: 2 [IU] via SUBCUTANEOUS

## 2019-02-12 MED ORDER — ACETAMINOPHEN 325 MG PO TABS
650.0000 mg | ORAL_TABLET | Freq: Once | ORAL | Status: AC
  Administered 2019-02-12: 17:00:00 650 mg via ORAL
  Filled 2019-02-12: qty 2

## 2019-02-12 MED ORDER — SODIUM CHLORIDE 0.9 % IV SOLN
2.0000 g | Freq: Three times a day (TID) | INTRAVENOUS | Status: DC
  Administered 2019-02-13 – 2019-02-15 (×7): 2 g via INTRAVENOUS
  Filled 2019-02-12 (×8): qty 2

## 2019-02-12 MED ORDER — VANCOMYCIN HCL 10 G IV SOLR
2500.0000 mg | Freq: Once | INTRAVENOUS | Status: AC
  Administered 2019-02-12: 22:00:00 2500 mg via INTRAVENOUS
  Filled 2019-02-12: qty 2500

## 2019-02-12 MED ORDER — ALBUTEROL SULFATE HFA 108 (90 BASE) MCG/ACT IN AERS
8.0000 | INHALATION_SPRAY | Freq: Once | RESPIRATORY_TRACT | Status: AC
  Administered 2019-02-12: 17:00:00 8 via RESPIRATORY_TRACT
  Filled 2019-02-12: qty 6.7

## 2019-02-12 MED ORDER — FUROSEMIDE 10 MG/ML IJ SOLN
40.0000 mg | Freq: Once | INTRAMUSCULAR | Status: AC
  Administered 2019-02-12: 21:00:00 40 mg via INTRAVENOUS
  Filled 2019-02-12: qty 4

## 2019-02-12 MED ORDER — AEROCHAMBER PLUS FLO-VU MISC: 1.0000 | Freq: Once | Status: DC

## 2019-02-12 MED ORDER — FUROSEMIDE 10 MG/ML IJ SOLN
40.0000 mg | Freq: Two times a day (BID) | INTRAMUSCULAR | Status: DC
  Administered 2019-02-13 – 2019-02-17 (×9): 40 mg via INTRAVENOUS
  Filled 2019-02-12 (×9): qty 4

## 2019-02-12 MED ORDER — IPRATROPIUM-ALBUTEROL 20-100 MCG/ACT IN AERS: 1.0000 | INHALATION_SPRAY | Freq: Four times a day (QID) | RESPIRATORY_TRACT | Status: DC

## 2019-02-12 NOTE — ED Notes (Signed)
[facility] called and transport set up at this time

## 2019-02-12 NOTE — ED Notes (Signed)
Report to Freda Munro at [facility]

## 2019-02-12 NOTE — ED Provider Notes (Addendum)
[facility] DEPT Provider Note   CSN: [id number] Arrival date & time: 02/12/19  1619    History   Chief Complaint Chief Complaint  Patient presents with  . Shortness of Breath  . Fever    HPI [name] is a 63 y.o. male who  has a past medical history of Anemia, Arthritis, Cellulitis and abscess of leg (hositalized 04/25/2015), Chronic hip pain, Depression, GERD (gastroesophageal reflux disease), Hypercholesterolemia, Hypertension, Pneumonia (1960's X 1), Sleep apnea, Thalassemia minor, and Type II diabetes mellitus ([place]). He presents for acute hypoxia after testing positive for there is a level SARS-CoV-2.  Is from [facility] skilled nursing facility.  He is here for his chronic leg wounds.  There is a level 5 caveat due to altered mental status.  History is gathered by EMR, EMS.  Patient is currently on 6 L of oxygen.  Found to be febrile.  He has had a cough and some wheezing.  Patient has Unna boots in place that are changed to Monday Wednesday Friday.      Shortness of Breath  Associated symptoms: cough and fever   Fever  Associated symptoms: cough     Past Medical History:  Diagnosis Date  . Anemia   . Arthritis    "left hip" (04/26/2015)  . Cellulitis and abscess of leg hositalized 04/25/2015   left  . Chronic hip pain   . Depression   . GERD (gastroesophageal reflux disease)   . Hypercholesterolemia   . Hypertension   . Pneumonia 1960's X 1  . Sleep apnea    "couldn't wear the mask" (04/26/2015)  . Thalassemia minor   . Type II diabetes mellitus [facility])     Patient Active Problem List   Diagnosis Date Noted  . Dyspnea 02/12/2019  . Pain in left wrist 11/03/2017  . Acute diastolic heart failure ([place]) 08/25/2016  . Hypoxia 08/21/2016  . Acute respiratory failure with hypoxia and hypercapnia ([place]) 08/21/2016  . Moderate protein-calorie malnutrition ([place]) 08/21/2016  . Stasis edema of both lower extremities 08/21/2016  . Chronic pain  08/21/2016  . AKI (acute kidney injury) ([place]) 11/02/2015  . Hyperkalemia 11/02/2015  . Hypoglycemia 11/02/2015  . Cellulitis   . Leg ulcer ([place])   . Nonhealing ulcer of left lower extremity ([place])   . Pain of left leg   . Cellulitis of leg 04/25/2015  . Degenerative arthritis of hip 04/25/2015  . Cellulitis and abscess of leg   . Microcytic anemia 03/27/2015  . Hypertension   . Fever   . DM (diabetes mellitus), secondary, uncontrolled, with peripheral vascular complications ([place]) 62/95/2841  . MSSA (methicillin susceptible Staphylococcus aureus) infection 03/20/2015  . Leukocytosis 03/20/2015  . Anemia, iron deficiency 03/20/2015  . Cellulitis of left lower extremity   . Pyomyositis     Past Surgical History:  Procedure Laterality Date  . INGUINAL HERNIA REPAIR Right ~ 2004  . SHOULDER ARTHROSCOPY W/ ROTATOR CUFF REPAIR Right ~ 2004  . TOENAIL EXCISION Right 1980's X 2   "big toe"        Home Medications    Prior to Admission medications   Medication Sig Start Date End Date Taking? Authorizing Provider  acetaminophen (TYLENOL) 325 MG tablet Take 2 tablets (650 mg total) by mouth every 6 (six) hours as needed for mild pain (or Fever >/= 101). 03/23/15  Yes Robbie Lis, MD  albuterol (PROVENTIL) (2.5 MG/3ML) 0.083% nebulizer solution Inhale 3 mLs into the lungs every 4 (four) hours  as needed for shortness of breath. 11/29/18  Yes [provider]  Amino Acids-Protein Hydrolys (FEEDING SUPPLEMENT, PRO-STAT SUGAR FREE 64,) LIQD Take 30 mLs by mouth 3 (three) times daily with meals.   Yes [provider]  amitriptyline (ELAVIL) 100 MG tablet Take 100 mg by mouth at bedtime. 02/09/19  Yes [provider]  aspirin EC 81 MG tablet Take 81 mg by mouth every morning.   Yes [provider]  atorvastatin (LIPITOR) 80 MG tablet Take 80 mg by mouth daily.   Yes [provider]  carvedilol (COREG) 3.125 MG tablet Take 1 tablet (3.125 mg total) by  mouth 2 (two) times daily with a meal. 11/13/15  Yes Thurnell Lose, MD  DULoxetine (CYMBALTA) 20 MG capsule Take 40 mg by mouth 2 (two) times daily.   Yes [provider]  ferrous sulfate 325 (65 FE) MG tablet Take 325 mg by mouth 3 (three) times daily with meals.   Yes [provider]  furosemide (LASIX) 40 MG tablet Take 1 tablet (40 mg total) by mouth daily. 08/25/16  Yes Annita Brod, MD  insulin aspart (NOVOLOG) 100 UNIT/ML injection Before each meal 3 times a day, 140-199 - 2 units, 200-250 - 4 units, 251-299 - 6 units,  300-349 - 8 units,  350 or above 10 units. Dispense syringes and needles as needed, Ok to switch to PEN if approved. Substitute to any brand approved. DX DM2, Code E11.65 11/13/15  Yes Thurnell Lose, MD  insulin aspart protamine- aspart (NOVOLOG MIX 70/30) (70-30) 100 UNIT/ML injection Inject 0.25 mLs (25 Units total) into the skin 2 (two) times daily with a meal. Patient taking differently: Inject 50 Units into the skin 2 (two) times daily with a meal.  04/04/15  Yes Donne Hazel, MD  lisinopril (PRINIVIL,ZESTRIL) 2.5 MG tablet Take 1 tablet (2.5 mg total) by mouth daily. 11/13/15  Yes Thurnell Lose, MD  metFORMIN (GLUCOPHAGE) 500 MG tablet Take 500 mg by mouth daily.  10/15/17  Yes [provider]  mirtazapine (REMERON) 15 MG tablet Take 15 mg by mouth at bedtime.   Yes [provider]  Multiple Vitamin (MULTIVITAMIN WITH MINERALS) TABS tablet Take 1 tablet by mouth daily. 03/23/15  Yes Robbie Lis, MD  Multiple Vitamins-Minerals (CENTRUM ADULTS PO) Take 1 tablet by mouth daily.    Yes [provider]  oxyCODONE (OXY IR/ROXICODONE) 5 MG immediate release tablet Take 5 mg by mouth 3 (three) times daily as needed for severe pain.   Yes [provider]  oxyCODONE (OXYCONTIN) 10 mg 12 hr tablet Take 1 tablet (10 mg total) by mouth every 12 (twelve) hours. 08/25/16  Yes Annita Brod, MD  pantoprazole  (PROTONIX) 40 MG tablet Take 1 tablet (40 mg total) by mouth 2 (two) times daily. 11/13/15  Yes Thurnell Lose, MD  tamsulosin (FLOMAX) 0.4 MG CAPS capsule Take 0.4 mg by mouth daily after supper.   Yes [provider]  tiZANidine (ZANAFLEX) 4 MG tablet Take 4 mg by mouth every 8 (eight) hours as needed for muscle spasms.   Yes [provider]    Family History Family History  Problem Relation Age of Onset  . Diabetes Mellitus II Mother   . Diabetes Mellitus II Father   . Diabetes Mellitus II Brother     Social History Social History   Tobacco Use  . Smoking status: Current Every Day Smoker    Packs/day: 0.12  Years: 45.00    Pack years: 5.40    Types: Cigarettes  . Smokeless tobacco: Never Used  Substance Use Topics  . Alcohol use: No    Frequency: Never    Comment: 04/26/2015 "I may drink 1/2 glass of wine or a couple beers 1-2 times/month; if that"  . Drug use: No    Comment: "stopped all drug use in 2005; S/P SARP program in [employer]"     Allergies   Other and Sulfa antibiotics   Review of Systems Review of Systems  Unable to perform ROS: Mental status change  Constitutional: Positive for fever.  Respiratory: Positive for cough and shortness of breath.   Skin: Positive for wound.     Physical Exam Updated Vital Signs BP (!) 157/79   Pulse 98   Temp 100.2 F (37.9 C) (Oral)   Resp (!) 3   Ht 6' (1.829 m)   Wt 113.7 kg   SpO2 100%   BMI 34.00 kg/m   Physical Exam Vitals signs and nursing note reviewed.  Constitutional:      General: He is not in acute distress.    Appearance: He is well-developed. He is obese. He is not diaphoretic.     Interventions: Face mask in place.  HENT:     Head: Normocephalic and atraumatic.  Eyes:     General: No scleral icterus.    Conjunctiva/sclera: Conjunctivae normal.  Neck:     Musculoskeletal: Normal range of motion and neck supple.  Cardiovascular:     Rate and Rhythm: Regular rhythm.      Heart sounds: Normal heart sounds.  Pulmonary:     Effort: Pulmonary effort is normal. No respiratory distress.     Breath sounds: Wheezing present.  Abdominal:     Palpations: Abdomen is soft.     Tenderness: There is no abdominal tenderness.  Musculoskeletal:     Right lower leg: Edema present.     Left lower leg: Edema present.  Skin:    General: Skin is warm and dry.     Findings: Wound present.     Comments: Patient with woody edema of the bilateral feet.  Unna boots removed.  Multiple stage II and III ulcerations over both legs.  2+ pitting edema  Neurological:     Mental Status: He is easily aroused. He is lethargic.  Psychiatric:        Behavior: Behavior normal. Behavior is cooperative.      ED Treatments / Results  Labs (all labs ordered are listed, but only abnormal results are displayed) Labs Reviewed  CULTURE, BLOOD (ROUTINE X 2) - Abnormal; Notable for the following components:      Result Value   Culture   (*)    Value: STAPHYLOCOCCUS SPECIES (COAGULASE NEGATIVE) THE SIGNIFICANCE OF ISOLATING THIS ORGANISM FROM A SINGLE SET OF BLOOD CULTURES WHEN MULTIPLE SETS ARE DRAWN IS UNCERTAIN. PLEASE NOTIFY THE MICROBIOLOGY DEPARTMENT WITHIN ONE WEEK IF SPECIATION AND SENSITIVITIES ARE REQUIRED. Performed at [facility], [place] [address], [place], [place] [address]    All other components within normal limits  SARS CORONAVIRUS 2 (HOSPITAL ORDER, [facility]) - Abnormal; Notable for the following components:   SARS Coronavirus 2 POSITIVE (*)    All other components within normal limits  BLOOD CULTURE ID PANEL (REFLEXED) - Abnormal; Notable for the following components:   Staphylococcus species DETECTED (*)    Methicillin resistance DETECTED (*)    All other components within  normal limits  CBC WITH DIFFERENTIAL/PLATELET - Abnormal; Notable for the following components:   RBC 5.85 (*)    Hemoglobin 11.1 (*)    MCV 66.7 (*)    MCH 19.0  (*)    MCHC 28.5 (*)    RDW 24.1 (*)    All other components within normal limits  COMPREHENSIVE METABOLIC PANEL - Abnormal; Notable for the following components:   Glucose, Bld 194 (*)    BUN 29 (*)    Calcium 7.8 (*)    Albumin 2.7 (*)    Total Bilirubin 0.2 (*)    All other components within normal limits  LACTATE DEHYDROGENASE - Abnormal; Notable for the following components:   LDH 217 (*)    All other components within normal limits  FIBRINOGEN - Abnormal; Notable for the following components:   Fibrinogen 639 (*)    All other components within normal limits  C-REACTIVE PROTEIN - Abnormal; Notable for the following components:   CRP 21.9 (*)    All other components within normal limits  BLOOD GAS, VENOUS - Abnormal; Notable for the following components:   pCO2, Ven 68.5 (*)    pO2, Ven 78.4 (*)    Bicarbonate 35.0 (*)    Acid-Base Excess 5.8 (*)    All other components within normal limits  BLOOD GAS, ARTERIAL - Abnormal; Notable for the following components:   pH, Arterial 7.304 (*)    pCO2 arterial 66.9 (*)    pO2, Arterial 73.1 (*)    Bicarbonate 32.2 (*)    Acid-Base Excess 4.7 (*)    All other components within normal limits  HEMOGLOBIN A1C - Abnormal; Notable for the following components:   Hgb A1c MFr Bld 8.0 (*)    All other components within normal limits  CBC WITH DIFFERENTIAL/PLATELET - Abnormal; Notable for the following components:   RBC 6.67 (*)    Hemoglobin 12.3 (*)    MCV 68.4 (*)    MCH 18.4 (*)    MCHC 27.0 (*)    RDW 25.2 (*)    All other components within normal limits  COMPREHENSIVE METABOLIC PANEL - Abnormal; Notable for the following components:   Chloride 97 (*)    CO2 34 (*)    Glucose, Bld 338 (*)    BUN 29 (*)    Calcium 7.9 (*)    Albumin 2.7 (*)    Total Bilirubin 0.2 (*)    All other components within normal limits  C-REACTIVE PROTEIN - Abnormal; Notable for the following components:   CRP 23.2 (*)    All other components  within normal limits  INTERLEUKIN-6, PLASMA - Abnormal; Notable for the following components:   Interleukin-6, Plasma 21.3 (*)    All other components within normal limits  GLUCOSE, CAPILLARY - Abnormal; Notable for the following components:   Glucose-Capillary 265 (*)    All other components within normal limits  GLUCOSE, CAPILLARY - Abnormal; Notable for the following components:   Glucose-Capillary 307 (*)    All other components within normal limits  GLUCOSE, CAPILLARY - Abnormal; Notable for the following components:   Glucose-Capillary 313 (*)    All other components within normal limits  GLUCOSE, CAPILLARY - Abnormal; Notable for the following components:   Glucose-Capillary 289 (*)    All other components within normal limits  CBC WITH DIFFERENTIAL/PLATELET - Abnormal; Notable for the following components:   RBC 6.08 (*)    Hemoglobin 11.6 (*)    MCV 67.1 (*)  MCH 19.1 (*)    MCHC 28.4 (*)    RDW 24.4 (*)    Platelets 148 (*)    All other components within normal limits  COMPREHENSIVE METABOLIC PANEL - Abnormal; Notable for the following components:   CO2 34 (*)    Glucose, Bld 129 (*)    BUN 26 (*)    Calcium 7.8 (*)    Albumin 2.7 (*)    All other components within normal limits  C-REACTIVE PROTEIN - Abnormal; Notable for the following components:   CRP 14.7 (*)    All other components within normal limits  GLUCOSE, CAPILLARY - Abnormal; Notable for the following components:   Glucose-Capillary 157 (*)    All other components within normal limits  GLUCOSE, CAPILLARY - Abnormal; Notable for the following components:   Glucose-Capillary 166 (*)    All other components within normal limits  GLUCOSE, CAPILLARY - Abnormal; Notable for the following components:   Glucose-Capillary 128 (*)    All other components within normal limits  GLUCOSE, CAPILLARY - Abnormal; Notable for the following components:   Glucose-Capillary 167 (*)    All other components within  normal limits  CBC WITH DIFFERENTIAL/PLATELET - Abnormal; Notable for the following components:   RBC 5.95 (*)    Hemoglobin 11.0 (*)    MCV 68.1 (*)    MCH 18.5 (*)    MCHC 27.2 (*)    RDW 24.2 (*)    Platelets 140 (*)    All other components within normal limits  COMPREHENSIVE METABOLIC PANEL - Abnormal; Notable for the following components:   Potassium 3.4 (*)    Glucose, Bld 114 (*)    Calcium 6.9 (*)    Total Protein 6.4 (*)    Albumin 2.2 (*)    All other components within normal limits  C-REACTIVE PROTEIN - Abnormal; Notable for the following components:   CRP 12.9 (*)    All other components within normal limits  GLUCOSE, CAPILLARY - Abnormal; Notable for the following components:   Glucose-Capillary 123 (*)    All other components within normal limits  GLUCOSE, CAPILLARY - Abnormal; Notable for the following components:   Glucose-Capillary 175 (*)    All other components within normal limits  CULTURE, BLOOD (ROUTINE X 2)  URINE CULTURE  MRSA PCR SCREENING  LACTIC ACID, PLASMA  D-DIMER, QUANTITATIVE (NOT AT [facility])  PROCALCITONIN  FERRITIN  TRIGLYCERIDES  BRAIN NATRIURETIC PEPTIDE  HIV ANTIBODY (ROUTINE TESTING W REFLEX)  GLUCOSE, CAPILLARY  FERRITIN  ABO/RH    EKG EKG Interpretation  Date/Time:  Friday Feb 12 2019 16:38:00 EDT Ventricular Rate:  93 PR Interval:    QRS Duration: 100 QT Interval:  372 QTC Calculation: 463 R Axis:   29 Text Interpretation:  Sinus rhythm Baseline wander in lead(s) I II III aVR aVL since last tracing no significant change Confirmed by Malvin Johns [phone]) on 02/12/2019 4:58:22 PM Also confirmed by Malvin Johns [phone]), editor Hattie Perch ([id number])  on 02/13/2019 1:33:09 PM   Radiology No results found.  Procedures .Critical Care Performed by: Margarita Mail, PA-C Authorized by: Margarita Mail, PA-C   Critical care provider statement:    Critical care time (minutes):  50   Critical care was necessary to treat  or prevent imminent or life-threatening deterioration of the following conditions:  Respiratory failure   Critical care was time spent personally by me on the following activities:  Discussions with consultants, evaluation of patient's response to treatment, examination of patient, ordering  and performing treatments and interventions, ordering and review of laboratory studies, ordering and review of radiographic studies, pulse oximetry, re-evaluation of patient's condition, obtaining history from patient or surrogate and review of old charts   (including critical care time)  Medications Ordered in ED Medications  metroNIDAZOLE (FLAGYL) IVPB 500 mg ( Intravenous Rate/Dose Change 02/15/19 0620)  acetaminophen (TYLENOL) tablet 650 mg (650 mg Oral Given by Other 02/14/19 0924)  polyethylene glycol (MIRALAX / GLYCOLAX) packet 17 g (has no administration in time range)  furosemide (LASIX) injection 40 mg (40 mg Intravenous Given 02/15/19 0929)  insulin aspart (novoLOG) injection 0-15 Units (3 Units Subcutaneous Given 02/15/19 1259)  albuterol (VENTOLIN HFA) 108 (90 Base) MCG/ACT inhaler 2 puff (has no administration in time range)  aspirin EC tablet 81 mg (81 mg Oral Given 02/15/19 0621)  atorvastatin (LIPITOR) tablet 80 mg (80 mg Oral Given 02/15/19 0928)  carvedilol (COREG) tablet 3.125 mg (3.125 mg Oral Given 02/15/19 0928)  amitriptyline (ELAVIL) tablet 100 mg (100 mg Oral Given 02/14/19 2225)  DULoxetine (CYMBALTA) DR capsule 40 mg (40 mg Oral Given 02/15/19 0927)  0.9 %  sodium chloride infusion ( Intravenous Stopped 02/15/19 1009)  insulin aspart protamine- aspart (NOVOLOG MIX 70/30) injection 50 Units (50 Units Subcutaneous Given 02/15/19 0925)  mirtazapine (REMERON) tablet 15 mg (15 mg Oral Given 02/14/19 2225)  oxyCODONE (Oxy IR/ROXICODONE) immediate release tablet 5 mg (5 mg Oral Given 02/15/19 0621)  pantoprazole (PROTONIX) EC tablet 40 mg (40 mg Oral Given 02/15/19 0928)  tiZANidine (ZANAFLEX) tablet  4 mg (4 mg Oral Given 02/14/19 2225)  tamsulosin (FLOMAX) capsule 0.4 mg (0.4 mg Oral Given 02/14/19 1639)  enoxaparin (LOVENOX) injection 60 mg (60 mg Subcutaneous Given 02/15/19 0927)  oxyCODONE (Oxy IR/ROXICODONE) immediate release tablet 5 mg (5 mg Oral Given 02/15/19 1259)  feeding supplement (ENSURE ENLIVE) (ENSURE ENLIVE) liquid 237 mL (237 mLs Oral Given 02/14/19 2103)  nutrition supplement (JUVEN) (JUVEN) powder packet 1 packet (1 packet Oral Given 02/15/19 1259)  feeding supplement (PRO-STAT SUGAR FREE 64) liquid 30 mL (30 mLs Oral Given 02/15/19 1259)  guaiFENesin-dextromethorphan (ROBITUSSIN DM) 100-10 MG/5ML syrup 5 mL (5 mLs Oral Given 02/15/19 0953)  polyvinyl alcohol (LIQUIFILM TEARS) 1.4 % ophthalmic solution 2 drop (has no administration in time range)  cefTRIAXone (ROCEPHIN) 1 g in sodium chloride 0.9 % 100 mL IVPB ( Intravenous Rate/Dose Verify 02/15/19 1327)  acetaminophen (TYLENOL) tablet 650 mg (650 mg Oral Given 02/12/19 1707)  albuterol (VENTOLIN HFA) 108 (90 Base) MCG/ACT inhaler 8 puff (8 puffs Inhalation Given 02/12/19 1709)  aerochamber Z-Stat Plus/medium 1 each (1 each Other Given 02/12/19 1709)  furosemide (LASIX) injection 40 mg (40 mg Intravenous Given 02/12/19 2059)  ceFEPIme (MAXIPIME) 2 g in sodium chloride 0.9 % 100 mL IVPB (0 g Intravenous Stopped 02/12/19 2155)  vancomycin (VANCOCIN) 2,500 mg in sodium chloride 0.9 % 500 mL IVPB (2,500 mg Intravenous New Bag/Given 02/12/19 2210)     Initial Impression / Assessment and Plan / ED Course  I have reviewed the triage vital signs and the nursing notes.  Pertinent labs & imaging results that were available during my care of the patient were reviewed by me and considered in my medical decision making (see chart for details).  Clinical Course as of Feb 14 1357  Fri Feb 12, 2019  1814 CRP(!): 21.9 [AH]  1814 D-Dimer, Quant: 0.46 [AH]  1814 Fibrinogen(!): 639 [AH]  1814 WBC: 5.5 [AH]  1814 LDH(!): 217 [AH]    Clinical  Course User Index [AH] Margarita Mail, PA-C      CC: New-onset hypoxia and shortness of breath with outpatient positive SARS-CoV-2 test VS: Patient febrile without hypotension, elevated respiratory rate. VC:BSWHQPR is gathered by EMS, nursing notes and EMR. DDX: DDX includes DDX includes sepsis, pneumonia, CHF exacerbation, MI. Labs: I reviewed the labs which show decompensated respiratory acidosis on ABG.  Mild anemia without leukocytosis, elevated fibrinogen and LDH levels, negative d-dimer. Imaging: I personally reviewed the images (2 view chest x-ray) which show(s) right sided pleural effusion EKG:  EKG Interpretation  Date/Time:  Friday Feb 12 2019 16:38:00 EDT Ventricular Rate:  93 PR Interval:    QRS Duration: 100 QT Interval:  372 QTC Calculation: 463 R Axis:   29 Text Interpretation:  Sinus rhythm Baseline wander in lead(s) I II III aVR aVL since last tracing no significant change Confirmed by Malvin Johns [phone]) on 02/12/2019 4:58:22 PM Also confirmed by Malvin Johns [phone]), editor Hattie Perch [phone])  on 02/13/2019 1:33:09 PM      MDM: Patient here with known SARS coronavirus patient here with known SARS coronavirus positive test in the outpatient setting.  I was unable to find a documented source of this and repeated test here which is positive.  I doubt sepsis although the patient does have open sores given the fact that he does not have an elevated leukocytosis or hypo-hypotension.  Respiratory rate improved with oxygen compensation.  Patient will be admitted to the hospitalist service.  He is improved here in the emergency department Patient disposition:admit Patient condition: serious. The patient appears reasonably stabilized for admission considering the current resources, flow, and capabilities available in the ED at this time, and I doubt any other [facility] requiring further screening and/or treatment in the ED prior to admission.   Final Clinical  Impressions(s) / ED Diagnoses   Final diagnoses:  2019 novel coronavirus disease (COVID-19)  Hypoxia    ED Discharge Orders    None       Margarita Mail, PA-C 02/15/19 1358    Margarita Mail, PA-C 02/15/19 1359    Malvin Johns, MD 02/15/19 [phone]

## 2019-02-12 NOTE — H&P (Addendum)
History and Physical    [name] CHE:[id number] DOB: Jul 03, 1956 DOA: 02/12/2019  PCP: Charlsie Merles, MD   Patient coming from: [facility]  I have personally briefly reviewed patient's old medical records in [place]  Chief Complaint: SOB, Low O2, Covid positive 2 days ago.  HPI: [name] is a 63 y.o. male with medical history significant for DM, hypertension, diastolic CHF, who was brought to the ED from [facility] with complaints of increased difficulty breathing, and low oxygen.  Arrival patient's O2 sats were 80%, on 6 L nasal cannula, he was subsequently placed on nonrebreather with sats increasing to 96%. EMS gave patient was given epinephrine, Solu-Medrol and 2 g of magnesium.  At the time of my evaluation patient is awake, slightly drowsy but responds to questions, he denies cough, pain with urination, abdominal pain or chest pain.  Patient tells me he is on chronic O2 but is unable to tell me how much oxygen he is on.  ED Course: Temp 101.3, initial tachypnea 24 improved, pressure systolic 536R to 443X, O2 sats at the time of my evaluation greater than 97% on 6 L.  WBC 5.5.  Mild elevated LDH at 217, CRP chronically elevated but improved compared to prior, fibrinogen elevated at 639.  Unremarkable d-dimer, ferritin, triglyceride.  Compared to prior at 66.  Procalcitonin 1.2. SARs Co-vid test positive.  Portable chest x-ray shows large right pleural effusion with right basilar atelectasis.  ABG ordered in the ED pending.  Hospitalist to admit for SARS-CoV-2 related hypoxia.  Review of Systems: As per HPI all other systems reviewed and negative.  Past Medical History:  Diagnosis Date  . Anemia   . Arthritis    "left hip" (04/26/2015)  . Cellulitis and abscess of leg hositalized 04/25/2015   left  . Chronic hip pain   . Depression   . GERD (gastroesophageal reflux disease)   . Hypercholesterolemia   . Hypertension   . Pneumonia 1960's X  1  . Sleep apnea    "couldn't wear the mask" (04/26/2015)  . Thalassemia minor   . Type II diabetes mellitus ([place])     Past Surgical History:  Procedure Laterality Date  . INGUINAL HERNIA REPAIR Right ~ 2004  . SHOULDER ARTHROSCOPY W/ ROTATOR CUFF REPAIR Right ~ 2004  . TOENAIL EXCISION Right 1980's X 2   "big toe"     reports that he has been smoking cigarettes. He has a 5.40 pack-year smoking history. He has never used smokeless tobacco. He reports that he does not drink alcohol or use drugs.  Allergies  Allergen Reactions  . Other     Pt is a Jehovah Witness. No blood products.  . Sulfa Antibiotics     Causes flu-like symptom : sweating, chills, fever, body aches    Family History  Problem Relation Age of Onset  . Diabetes Mellitus II Mother   . Diabetes Mellitus II Father   . Diabetes Mellitus II Brother     Prior to Admission medications   Medication Sig Start Date End Date Taking? Authorizing Provider  acetaminophen (TYLENOL) 325 MG tablet Take 2 tablets (650 mg total) by mouth every 6 (six) hours as needed for mild pain (or Fever >/= 101). 03/23/15  Yes Robbie Lis, MD  albuterol (PROVENTIL) (2.5 MG/3ML) 0.083% nebulizer solution Inhale 3 mLs into the lungs every 4 (four) hours as needed for shortness of breath. 11/29/18  Yes [provider]  Amino  Acids-Protein Hydrolys (FEEDING SUPPLEMENT, PRO-STAT SUGAR FREE 64,) LIQD Take 30 mLs by mouth 3 (three) times daily with meals.   Yes [provider]  amitriptyline (ELAVIL) 100 MG tablet Take 100 mg by mouth at bedtime. 02/09/19  Yes [provider]  aspirin EC 81 MG tablet Take 81 mg by mouth every morning.   Yes [provider]  atorvastatin (LIPITOR) 80 MG tablet Take 80 mg by mouth daily.   Yes [provider]  carvedilol (COREG) 3.125 MG tablet Take 1 tablet (3.125 mg total) by mouth 2 (two) times daily with a meal. 11/13/15  Yes Thurnell Lose, MD  DULoxetine (CYMBALTA) 20  MG capsule Take 40 mg by mouth 2 (two) times daily.   Yes [provider]  ferrous sulfate 325 (65 FE) MG tablet Take 325 mg by mouth 3 (three) times daily with meals.   Yes [provider]  furosemide (LASIX) 40 MG tablet Take 1 tablet (40 mg total) by mouth daily. 08/25/16  Yes Annita Brod, MD  insulin aspart (NOVOLOG) 100 UNIT/ML injection Before each meal 3 times a day, 140-199 - 2 units, 200-250 - 4 units, 251-299 - 6 units,  300-349 - 8 units,  350 or above 10 units. Dispense syringes and needles as needed, Ok to switch to PEN if approved. Substitute to any brand approved. DX DM2, Code E11.65 11/13/15  Yes Thurnell Lose, MD  insulin aspart protamine- aspart (NOVOLOG MIX 70/30) (70-30) 100 UNIT/ML injection Inject 0.25 mLs (25 Units total) into the skin 2 (two) times daily with a meal. Patient taking differently: Inject 50 Units into the skin 2 (two) times daily with a meal.  04/04/15  Yes Donne Hazel, MD  lisinopril (PRINIVIL,ZESTRIL) 2.5 MG tablet Take 1 tablet (2.5 mg total) by mouth daily. 11/13/15  Yes Thurnell Lose, MD  metFORMIN (GLUCOPHAGE) 500 MG tablet Take 500 mg by mouth daily.  10/15/17  Yes [provider]  mirtazapine (REMERON) 15 MG tablet Take 15 mg by mouth at bedtime.   Yes [provider]  Multiple Vitamin (MULTIVITAMIN WITH MINERALS) TABS tablet Take 1 tablet by mouth daily. 03/23/15  Yes Robbie Lis, MD  Multiple Vitamins-Minerals (CENTRUM ADULTS PO) Take 1 tablet by mouth daily.    Yes [provider]  oxyCODONE (OXY IR/ROXICODONE) 5 MG immediate release tablet Take 5 mg by mouth 3 (three) times daily as needed for severe pain.   Yes [provider]  oxyCODONE (OXYCONTIN) 10 mg 12 hr tablet Take 1 tablet (10 mg total) by mouth every 12 (twelve) hours. 08/25/16  Yes Annita Brod, MD  pantoprazole (PROTONIX) 40 MG tablet Take 1 tablet (40 mg total) by mouth 2 (two) times daily. 11/13/15  Yes Thurnell Lose, MD  tamsulosin (FLOMAX) 0.4 MG CAPS capsule Take 0.4 mg by mouth daily after supper.   Yes [provider]  tiZANidine (ZANAFLEX) 4 MG tablet Take 4 mg by mouth every 8 (eight) hours as needed for muscle spasms.   Yes [provider]    Physical Exam: Vitals:   02/12/19 2130 02/12/19 2157 02/12/19 2200 02/12/19 2211  BP: 140/77  134/77 (!) 147/78  Pulse: 73 68 70 66  Resp: 15   17  Temp:      TempSrc:      SpO2: 97% 98% 97% 99%  Weight:      Height:        Constitutional: Appears slightly lethargic but awake,  answering questions. Vitals:   02/12/19 2130 02/12/19 2157 02/12/19 2200 02/12/19 2211  BP: 140/77  134/77 (!) 147/78  Pulse: 73 68 70 66  Resp: 15   17  Temp:      TempSrc:      SpO2: 97% 98% 97% 99%  Weight:      Height:       Eyes: PERRL, lids and conjunctivae normal ENMT: Mucous membranes are moist.  Neck: normal, supple, no masses, no thyromegaly Respiratory: Normal respiratory effort. No accessory muscle use.  Cardiovascular: Regular rate and rhythm, no murmurs / rubs / gallops.  Abdomen: Full, No tenderness, no masses palpated. No hepatosplenomegaly. Bowel sounds positive.   Musculoskeletal: no clubbing / cyanosis. No joint deformity upper and lower extremities. Good ROM, no contractures. Normal muscle tone.  Skin: Significant woody induration to bilateral lower extremities, unable to tell if edematous, with ulcer to anterior medial surface of right leg, ~ 6cm by 4 cm.  A Small Area of normal appearing skin appears erythematous. Neurologic: CN 2-12 grossly intact.  Strength 5/5 in all 4.  Psychiatric: Normal judgment and insight. Lethargic, slight delay in responses but oriented x 3.   Labs on Admission: I have personally reviewed following labs and imaging studies  CBC: Recent Labs  Lab 02/12/19 1653  WBC 5.5  NEUTROABS 3.8  HGB 11.1*  HCT 39.0  MCV 66.7*  PLT 856   Basic Metabolic Panel: Recent Labs  Lab 02/12/19  1653  NA 139  K 3.9  CL 100  CO2 30  GLUCOSE 194*  BUN 29*  CREATININE 1.12  CALCIUM 7.8*   Liver Function Tests: Recent Labs  Lab 02/12/19 1653  AST 38  ALT 21  ALKPHOS 120  BILITOT 0.2*  PROT 7.4  ALBUMIN 2.7*   Lipid Profile: Recent Labs    02/12/19 1653  TRIG 136   Anemia Panel: Recent Labs    02/12/19 1653  FERRITIN 99   Urine analysis:    Component Value Date/Time   COLORURINE YELLOW 11/02/2015 [place] 11/02/2015 2258   LABSPEC 1.021 11/02/2015 2258   PHURINE 5.0 11/02/2015 2258   GLUCOSEU NEGATIVE 11/02/2015 2258   HGBUR NEGATIVE 11/02/2015 2258   BILIRUBINUR NEGATIVE 11/02/2015 2258   KETONESUR NEGATIVE 11/02/2015 2258   PROTEINUR NEGATIVE 11/02/2015 2258   UROBILINOGEN 0.2 03/26/2015 0144   NITRITE NEGATIVE 11/02/2015 2258   LEUKOCYTESUR SMALL (A) 11/02/2015 2258    Radiological Exams on Admission: Dg Chest Port 1 View  Result Date: 02/12/2019 CLINICAL DATA:  Shortness of breath EXAM: PORTABLE CHEST 1 VIEW COMPARISON:  Chest CT 08/21/2016 FINDINGS: Large right pleural effusion. Right basilar atelectasis. Cardiomediastinal contours are normal. No acute osseous abnormality. IMPRESSION: Large right pleural effusion with right basilar atelectasis. Electronically Signed   By: Ulyses Jarred M.D.   On: 02/12/2019 18:12    EKG: Independently reviewed.  Sinus rhythm, QTC 463.  No significant change from prior.  Assessment/Plan Active Problems:   Sepsis ([place])  Shortness of breath- likely secondary to large right pleural effusion with atelectasis likely from decompensated CHF also, and possibly COVID related bronchitis contributing to hypoxemia.  O2 sats on 5 L greater than 95%.  Patient lethargic but answering questions appropriately.  ABG-pH 6 mildly low at 7.3, CO2 66.9 chronically elevated, PO2 73 on 6 L O2.  At this time as patient is mentating well though slightly lethargic will continue nasal cannula.  Inflammatory markers mostly  unremarkable except for fibrinogen and LDH. -IV  Lasix 40 twice daily -BMP, CBC a.m. -Bronchodilators, supplimental O2 -Respiratory protocol -May need thoracocentesis if respiratory status does not improve with diuretics, asked Dr. Genevive Bi on call for critical care-if  thoracocentesis is needed, [facility] be [facility] at [facility].  Right lower extremity ulcer- chronic wound, likely has surrounding cellulitis but difficult to tell with significant woody induration.  Small area of normal-appearing skin appears erythematous.  WBC 5.5.  Febrile 101.3.  Initial tachypnea-usually meeting SIRS criteria, but with normal lactic acid 0.9.  -IV vancomycin, cefepime and metronidazole -Wound care consult -Follow-up blood cultures  -Add on urine cultures -BMP, CBC a.m.  Diastolic CHF appears decompensated- large right pleural effusion, BNP unchanged from prior, but patient obese BMI 35.,  Chronic lower extremity disease changes.  Exact volume status hard to tell.  Weights in Epic not Checked in the past year.  Home meds lists Lasix 40mg  daily.  Last echo 2017 EF 55 to 60%, G2DD. Tells me he is on chronic O2- ?how much. -IV Lasix 40 twice daily -BMP a.m. -Strict input output, daily weight -Updated echo  SARS CoVID +ve-[facility] [place] [facility] resident.  Tested +2 days ago.  Increasing difficulty breathing, chest x-ray without typical COVID pneumonia findings.   -F/u IL- 6  -Continue broad-spectrum antibiotics  Hypertension-stable. -Continue home carvedilol -Hold Lasix low-dose lisinopril 2.5 mg for now to limit nephrotoxins.    DM- random glucose 194 -Hold home Metformin - SSI - HGba1c  Depression -Continue home Elavil and duloxetine  DVT prophylaxis: Lovenox Code Status: Full Family Communication: None at bedside Disposition Plan: Per rounding team Consults called: None Admission status: Inpt, Step down I certify that at the point of admission it is my clinical judgment that the patient will require  inpatient hospital care spanning beyond 2 midnights from the point of admission due to high intensity of service, high risk for further deterioration and high frequency of surveillance required. The following factors support the patient status of inpatient: High O2 requirement, with chronic right lower extremity ulcer that appears infected requiring IV antibiotics at this time.    Bethena Roys MD [facility]  02/12/2019, 10:30 PM   \

## 2019-02-12 NOTE — Progress Notes (Signed)
Pharmacy Antibiotic Note  [name] is a 63 y.o. male with LE ulcers and POSITIVE COVID 2 days ago presented to the ED from [facility] on 02/12/2019 with c/o SOB. To start abx for suspected sepsis.  Plan: - vancomycin 2500 mg IV x1, then 1000 mg IV q12h for est AUC 518 - cefepime 2gm IV q8h - flagyl 500 mg IV q8h per MD ________________________________________ Temp (24hrs), Avg:100 F (37.8 C), Min:98.3 F (36.8 C), Max:101.3 F (38.5 C)  Recent Labs  Lab 02/12/19 1624 02/12/19 1653  WBC  --  5.5  CREATININE  --  1.12  LATICACIDVEN 0.9  --     CrCl cannot be calculated (Unknown ideal weight.).    Allergies  Allergen Reactions  . Other     Pt is a Jehovah Witness. No blood products.  . Sulfa Antibiotics     Causes flu-like symptom : sweating, chills, fever, body aches     Thank you for allowing pharmacy to be a part of this patient's care.  Lynelle Doctor 02/12/2019 8:56 PM

## 2019-02-12 NOTE — ED Notes (Signed)
Bed: UV25 Expected date:  Expected time:  Means of arrival:  Comments: EMS Covid +, SOB, nonrebreather

## 2019-02-12 NOTE — ED Notes (Signed)
ED TO INPATIENT HANDOFF REPORT  ED Nurse Name and Phone #: Joice Lofts [facility] RN [phone]  S Name/Age/Gender [name] 63 y.o. male Room/Bed: WA12/WA12  Code Status   Code Status: Prior  Home/SNF/Other Skilled nursing facility Patient oriented to: self, place, time and situation Is this baseline? Yes   Triage Complete: Triage complete  Chief Complaint SOB  Triage Note Pt from [facility].  Pt there for lower leg ulcers due to diabetes complications.  Pt hx of sepsis from the ulcers.  Pt confirmed COVID positive 2 days ago.  Pt called out today for SOB and low O2.  Low 80's on arrival, on simple mask with 6 L.  And 96% non-rebreather.   Temp 102, RR 26, HR 110.   Pt given 0.3 IM epi, 125 solu-medrol, and 2g magnesium.  Pt lethargic at this time.  20 R AC.   Allergies Allergies  Allergen Reactions  . Other     Pt is a Jehovah Witness. No blood products.  . Sulfa Antibiotics     Causes flu-like symptom : sweating, chills, fever, body aches    Level of Care/Admitting Diagnosis ED Disposition    ED Disposition Condition [facility]: [place] [[id number]]  Level of Care: Progressive [102]  Covid Evaluation: Confirmed COVID Positive  Isolation Risk Level: Comment  Comment: on 6L O2.  Diagnosis: Sepsis [facility]) [[id number]]  Admitting Physician: Bethena Roys [[id number]]  Attending Physician: Bethena Roys [phone]  Estimated length of stay: past midnight tomorrow  Certification:: I certify this patient will need inpatient services for at least 2 midnights  PT Class (Do Not Modify): Inpatient [101]  PT Acc Code (Do Not Modify): Private [1]       B Medical/Surgery History Past Medical History:  Diagnosis Date  . Anemia   . Arthritis    "left hip" (04/26/2015)  . Cellulitis and abscess of leg hositalized 04/25/2015   left  . Chronic hip pain   . Depression   . GERD (gastroesophageal reflux disease)   .  Hypercholesterolemia   . Hypertension   . Pneumonia 1960's X 1  . Sleep apnea    "couldn't wear the mask" (04/26/2015)  . Thalassemia minor   . Type II diabetes mellitus ([place])    Past Surgical History:  Procedure Laterality Date  . INGUINAL HERNIA REPAIR Right ~ 2004  . SHOULDER ARTHROSCOPY W/ ROTATOR CUFF REPAIR Right ~ 2004  . TOENAIL EXCISION Right 1980's X 2   "big toe"     A IV Location/Drains/Wounds Patient Lines/Drains/Airways Status   Active Line/Drains/Airways    Name:   Placement date:   Placement time:   Site:   Days:   Peripheral IV 02/12/19 Right Antecubital   02/12/19    -    Antecubital   less than 1   Peripheral IV 02/12/19 Left Forearm   02/12/19    1648    Forearm   less than 1   Pressure Ulcer 03/14/15 Stage II -  Partial thickness loss of dermis presenting as a shallow open ulcer with a red, pink wound bed without slough.   03/14/15    2300     1431   Wound / Incision (Open or Dehisced) 03/15/15 Diabetic ulcer Leg Right;Left   03/15/15    0740    Leg   1430   Wound / Incision (Open or Dehisced) 11/10/15 Diabetic ulcer Leg Right;Left   11/10/15  1117    Leg   1190   Wound / Incision (Open or Dehisced) 08/22/16 Diabetic ulcer Leg Right;Left   08/22/16    0052    Leg   904          Intake/Output Last 24 hours  Intake/Output Summary (Last 24 hours) at 02/12/2019 2157 Last data filed at 02/12/2019 2155 Gross per 24 hour  Intake 100 ml  Output -  Net 100 ml    Labs/Imaging Results for orders placed or performed during the hospital encounter of 02/12/19 (from the past 48 hour(s))  Lactic acid, plasma     Status: None   Collection Time: 02/12/19  4:24 PM  Result Value Ref Range   Lactic Acid, Venous 0.9 0.5 - 1.9 mmol/L    Comment: Performed at [facility], [place] [address], [place], [place] [address]  CBC WITH DIFFERENTIAL     Status: Abnormal   Collection Time: 02/12/19  4:53 PM  Result Value Ref Range   WBC 5.5 4.0 - 10.5 K/uL   RBC  5.85 (H) 4.22 - 5.81 MIL/uL   Hemoglobin 11.1 (L) 13.0 - 17.0 g/dL   HCT 39.0 39.0 - 52.0 %   MCV 66.7 (L) 80.0 - 100.0 fL   MCH 19.0 (L) 26.0 - 34.0 pg   MCHC 28.5 (L) 30.0 - 36.0 g/dL   RDW 24.1 (H) 11.5 - 15.5 %   Platelets 155 150 - 400 K/uL    Comment: REPEATED TO VERIFY PLATELET COUNT CONFIRMED BY SMEAR    nRBC 0.0 0.0 - 0.2 %   Neutrophils Relative % 70 %   Neutro Abs 3.8 1.7 - 7.7 K/uL   Lymphocytes Relative 25 %   Lymphs Abs 1.4 0.7 - 4.0 K/uL   Monocytes Relative 5 %   Monocytes Absolute 0.3 0.1 - 1.0 K/uL   Eosinophils Relative 0 %   Eosinophils Absolute 0.0 0.0 - 0.5 K/uL   Basophils Relative 0 %   Basophils Absolute 0.0 0.0 - 0.1 K/uL   RBC Morphology MORPHOLOGY UNREMARKABLE    Immature Granulocytes 0 %   Abs Immature Granulocytes 0.02 0.00 - 0.07 K/uL    Comment: Performed at [facility], [place] [address], [place], [place] [address]  Comprehensive metabolic panel     Status: Abnormal   Collection Time: 02/12/19  4:53 PM  Result Value Ref Range   Sodium 139 135 - 145 mmol/L   Potassium 3.9 3.5 - 5.1 mmol/L   Chloride 100 98 - 111 mmol/L   CO2 30 22 - 32 mmol/L   Glucose, Bld 194 (H) 70 - 99 mg/dL   BUN 29 (H) 8 - 23 mg/dL   Creatinine, Ser 1.12 0.61 - 1.24 mg/dL   Calcium 7.8 (L) 8.9 - 10.3 mg/dL   Total Protein 7.4 6.5 - 8.1 g/dL   Albumin 2.7 (L) 3.5 - 5.0 g/dL   AST 38 15 - 41 U/L   ALT 21 0 - 44 U/L   Alkaline Phosphatase 120 38 - 126 U/L   Total Bilirubin 0.2 (L) 0.3 - 1.2 mg/dL   GFR calc non Af Amer >60 >60 mL/min   GFR calc Af Amer >60 >60 mL/min   Anion gap 9 5 - 15    Comment: Performed at [facility], [place] [address] [place], [place] [address]  D-dimer, quantitative     Status: None   Collection Time: 02/12/19  4:53 PM  Result Value Ref Range  D-Dimer, Quant 0.46 0.00 - 0.50 ug/mL-FEU    Comment: (NOTE) At the manufacturer cut-off of 0.50 ug/mL FEU, this assay has been documented to exclude PE with a  sensitivity and negative predictive value of 97 to 99%.  At this time, this assay has not been approved by the FDA to exclude DVT/VTE. Results should be correlated with clinical presentation. Performed at [facility], [place] [address], [place], [place] [address]   Procalcitonin     Status: None   Collection Time: 02/12/19  4:53 PM  Result Value Ref Range   Procalcitonin 0.12 ng/mL    Comment:        Interpretation: PCT (Procalcitonin) <= 0.5 ng/mL: Systemic infection (sepsis) is not likely. Local bacterial infection is possible. (NOTE)       Sepsis PCT Algorithm           Lower Respiratory Tract                                      Infection PCT Algorithm    ----------------------------     ----------------------------         PCT < 0.25 ng/mL                PCT < 0.10 ng/mL         Strongly encourage             Strongly discourage   discontinuation of antibiotics    initiation of antibiotics    ----------------------------     -----------------------------       PCT 0.25 - 0.50 ng/mL            PCT 0.10 - 0.25 ng/mL               OR       >80% decrease in PCT            Discourage initiation of                                            antibiotics      Encourage discontinuation           of antibiotics    ----------------------------     -----------------------------         PCT >= 0.50 ng/mL              PCT 0.26 - 0.50 ng/mL               AND        <80% decrease in PCT             Encourage initiation of                                             antibiotics       Encourage continuation           of antibiotics    ----------------------------     -----------------------------        PCT >= 0.50 ng/mL                  PCT > 0.50 ng/mL  AND         increase in PCT                  Strongly encourage                                      initiation of antibiotics    Strongly encourage escalation           of antibiotics                                      -----------------------------                                           PCT <= 0.25 ng/mL                                                 OR                                        > 80% decrease in PCT                                     Discontinue / Do not initiate                                             antibiotics Performed at [place] [address], [place], [place] [address]   Lactate dehydrogenase     Status: Abnormal   Collection Time: 02/12/19  4:53 PM  Result Value Ref Range   LDH 217 (H) 98 - 192 U/L    Comment: Performed at [facility], [place] [address], [place], [place] [address]  Ferritin     Status: None   Collection Time: 02/12/19  4:53 PM  Result Value Ref Range   Ferritin 99 24 - 336 ng/mL    Comment: Performed at [facility], [place] [address] [place], [place] [address]  Triglycerides     Status: None   Collection Time: 02/12/19  4:53 PM  Result Value Ref Range   Triglycerides 136 <150 mg/dL    Comment: Performed at [facility], [place] [address] [place], [place] [address]  Fibrinogen     Status: Abnormal   Collection Time: 02/12/19  4:53 PM  Result Value Ref Range   Fibrinogen 639 (H) 210 - 475 mg/dL    Comment: Performed at [facility], [place] [address], [place], [place] [address]  C-reactive protein     Status: Abnormal   Collection Time: 02/12/19  4:53 PM  Result Value Ref Range   CRP 21.9 (H) <1.0 mg/dL    Comment: Performed at [facility], [place] [address], [place],  [address]  Brain natriuretic  peptide     Status: None   Collection Time: 02/12/19  4:53 PM  Result Value Ref Range   B Natriuretic Peptide 34.2 0.0 - 100.0 pg/mL    Comment: Performed at [facility], [place] [address], [place], [place] [address]  Blood gas, venous (at [facility] and [facility], not at [facility])     Status: Abnormal   Collection Time:  02/12/19  5:31 PM  Result Value Ref Range   pH, Ven 7.329 7.250 - 7.430   pCO2, Ven 68.5 (H) 44.0 - 60.0 mmHg   pO2, Ven 78.4 (H) 32.0 - 45.0 mmHg   Bicarbonate 35.0 (H) 20.0 - 28.0 mmol/L   Acid-Base Excess 5.8 (H) 0.0 - 2.0 mmol/L   O2 Saturation 91.6 %   Patient temperature 98.6    Collection site DRAWN BY RN    Drawn by DRAWN BY RN    Sample type VEIN     Comment: Performed at [facility], [place] [address] [place], [place] [address]  SARS Coronavirus 2 (CEPHEID- Performed in [facility] hospital lab), Hosp Order     Status: Abnormal   Collection Time: 02/12/19  7:18 PM  Result Value Ref Range   SARS Coronavirus 2 POSITIVE (A) NEGATIVE    Comment: RESULT CALLED TO, READ BACK BY AND VERIFIED WITH: O.Curt Oatis AT 2042 ON 02/12/19 BY N.THOMPSON (NOTE) If result is NEGATIVE SARS-CoV-2 target nucleic acids are NOT DETECTED. The SARS-CoV-2 RNA is generally detectable in upper and lower  respiratory specimens during the acute phase of infection. The lowest  concentration of SARS-CoV-2 viral copies this assay can detect is 250  copies / mL. A negative result does not preclude SARS-CoV-2 infection  and should not be used as the sole basis for treatment or other  patient management decisions.  A negative result may occur with  improper specimen collection / handling, submission of specimen other  than nasopharyngeal swab, presence of viral mutation(s) within the  areas targeted by this assay, and inadequate number of viral copies  (<250 copies / mL). A negative result must be combined with clinical  observations, patient history, and epidemiological information. If result is POSITIVE SARS-CoV-2 target nucleic acids are DETECTE D. The SARS-CoV-2 RNA is generally detectable in upper and lower  respiratory specimens during the acute phase of infection.  Positive  results are indicative of active infection with SARS-CoV-2.  Clinical  correlation with patient history and  other diagnostic information is  necessary to determine patient infection status.  Positive results do  not rule out bacterial infection or co-infection with other viruses. If result is PRESUMPTIVE POSTIVE SARS-CoV-2 nucleic acids MAY BE PRESENT.   A presumptive positive result was obtained on the submitted specimen  and confirmed on repeat testing.  While 2019 novel coronavirus  (SARS-CoV-2) nucleic acids may be present in the submitted sample  additional confirmatory testing may be necessary for epidemiological  and / or clinical management purposes  to differentiate between  SARS-CoV-2 and other Sarbecovirus currently known to infect humans.  If clinically indicated additional testing with an alternate test  methodology (LAB745 3) is advised. The SARS-CoV-2 RNA is generally  detectable in upper and lower respiratory specimens during the acute  phase of infection. The expected result is Negative. Fact Sheet for Patients:  StrictlyIdeas.no Fact Sheet for Healthcare Providers: BankingDealers.co.za This test is not yet approved or cleared by the [place] FDA and has been authorized for detection and/or diagnosis of SARS-CoV-2 by FDA under  an Emergency Use Authorization (EUA).  This EUA will remain in effect (meaning this test can be used) for the duration of the COVID-19 declaration under Section 564(b)(1) of the Act, 21 U.S.C. section 360bbb-3(b)(1), unless the authorization is terminated or revoked sooner. Performed at [facility], [place] [address], [place], [place] [address]   Blood gas, arterial     Status: Abnormal   Collection Time: 02/12/19  8:50 PM  Result Value Ref Range   O2 Content 6.0 L/min   Delivery systems NASAL CANNULA    pH, Arterial 7.304 (L) 7.350 - 7.450   pCO2 arterial 66.9 (HH) 32.0 - 48.0 mmHg    Comment: CRITICAL RESULT CALLED TO, READ BACK BY AND VERIFIED WITH: ABIGAIL HARRIS,PA AT 2057  BY AMY RAY,RRT,RCP ON 02/12/2019    pO2, Arterial 73.1 (L) 83.0 - 108.0 mmHg   Bicarbonate 32.2 (H) 20.0 - 28.0 mmol/L   Acid-Base Excess 4.7 (H) 0.0 - 2.0 mmol/L   O2 Saturation 90.2 %   Patient temperature 98.6    Collection site RIGHT RADIAL    Drawn by [phone]    Sample type ARTERIAL DRAW    Allens test (pass/fail) PASS PASS    Comment: Performed at [facility], [place] [address], [place], [place] [address]   Dg Chest Port 1 View  Result Date: 02/12/2019 CLINICAL DATA:  Shortness of breath EXAM: PORTABLE CHEST 1 VIEW COMPARISON:  Chest CT 08/21/2016 FINDINGS: Large right pleural effusion. Right basilar atelectasis. Cardiomediastinal contours are normal. No acute osseous abnormality. IMPRESSION: Large right pleural effusion with right basilar atelectasis. Electronically Signed   By: Ulyses Jarred M.D.   On: 02/12/2019 18:12    Pending Labs Unresulted Labs (From admission, onward)    Start     Ordered   02/12/19 2040  Culture, Urine  Add-on,   R     02/12/19 2039   02/12/19 1624  Blood Culture (routine x 2)  BLOOD CULTURE X 2,   STAT     02/12/19 1624   Signed and Held  HIV antibody (Routine Testing)  Once,   R     Signed and Held   Signed and Held  ABO/Rh  Once,   R     Signed and Held   Signed and Held  CBC with Differential/Platelet  Daily,   R     Signed and Held   Signed and Held  Comprehensive metabolic panel  Daily,   R     Signed and Held   Signed and Held  C-reactive protein  Daily,   R     Signed and Held   Signed and Held  Interleukin-6, Plasma  Once,   R     Signed and Held          Vitals/Pain Today's Vitals   02/12/19 2104 02/12/19 2115 02/12/19 2117 02/12/19 2130  BP:  (!) 142/80  140/77  Pulse:  73 74 73  Resp:  18 18 15   Temp:      TempSrc:      SpO2:  98% 97% 97%  Weight: 120.2 kg     Height: 6' (1.829 m)       Isolation Precautions No active isolations  Medications Medications  metroNIDAZOLE (FLAGYL) IVPB 500 mg (500 mg  Intravenous New Bag/Given 02/12/19 2100)  vancomycin (VANCOCIN) 2,500 mg in sodium chloride 0.9 % 500 mL IVPB (has no administration in time range)  vancomycin (VANCOCIN) IVPB 1000 mg/200 mL premix (has no  administration in time range)  ceFEPIme (MAXIPIME) 2 g in sodium chloride 0.9 % 100 mL IVPB (has no administration in time range)  acetaminophen (TYLENOL) tablet 650 mg (650 mg Oral Given 02/12/19 1707)  albuterol (VENTOLIN HFA) 108 (90 Base) MCG/ACT inhaler 8 puff (8 puffs Inhalation Given 02/12/19 1709)  aerochamber Z-Stat Plus/medium 1 each (1 each Other Given 02/12/19 1709)  furosemide (LASIX) injection 40 mg (40 mg Intravenous Given 02/12/19 2059)  ceFEPIme (MAXIPIME) 2 g in sodium chloride 0.9 % 100 mL IVPB (0 g Intravenous Stopped 02/12/19 2155)    Mobility non-ambulatory High fall risk   Focused Assessments Pulmonary Assessment Handoff:  Lung sounds: Bilateral Breath Sounds: Diminished L Breath Sounds: Diminished R Breath Sounds: Diminished O2 Device: Nasal Cannula O2 Flow Rate (L/min): 5 L/min      R Recommendations: See Admitting Provider Note  Report given to:   Additional Notes: Covid Positive

## 2019-02-12 NOTE — ED Triage Notes (Signed)
Pt from [facility].  Pt there for lower leg ulcers due to diabetes complications.  Pt hx of sepsis from the ulcers.  Pt confirmed COVID positive 2 days ago.  Pt called out today for SOB and low O2.  Low 80's on arrival, on simple mask with 6 L.  And 96% non-rebreather.   Temp 102, RR 26, HR 110.   Pt given 0.3 IM epi, 125 solu-medrol, and 2g magnesium.  Pt lethargic at this time.  20 R AC.

## 2019-02-12 NOTE — ED Notes (Signed)
Report to [facility] RN at [facility] attempted at this time.  RN to call back when available.

## 2019-02-12 NOTE — ED Notes (Signed)
Date and time results received: 02/12/19 2043  (use smartphrase ".now" to insert current time)  Test: COVID Critical Value: Positive  Name of Provider Notified: Belfi  Orders Received? Or Actions Taken?: transfer

## 2019-02-13 ENCOUNTER — Inpatient Hospital Stay (HOSPITAL_COMMUNITY): Payer: Self-pay

## 2019-02-13 LAB — BLOOD CULTURE ID PANEL (REFLEXED)

## 2019-02-13 LAB — COMPREHENSIVE METABOLIC PANEL
ALT: 25 U/L (ref 0–44)
AST: 38 U/L (ref 15–41)
Albumin: 2.7 g/dL — ABNORMAL LOW (ref 3.5–5.0)
Alkaline Phosphatase: 125 U/L (ref 38–126)
Anion gap: 11 (ref 5–15)
BUN: 29 mg/dL — ABNORMAL HIGH (ref 8–23)
CO2: 34 mmol/L — ABNORMAL HIGH (ref 22–32)
Calcium: 7.9 mg/dL — ABNORMAL LOW (ref 8.9–10.3)
Chloride: 97 mmol/L — ABNORMAL LOW (ref 98–111)
Creatinine, Ser: 1.18 mg/dL (ref 0.61–1.24)
GFR calc Af Amer: >60 mL/min (ref >60)
GFR calc non Af Amer: >60 mL/min (ref >60)
Glucose, Bld: 338 mg/dL — ABNORMAL HIGH (ref 70–99)
Potassium: 4.2 mmol/L (ref 3.5–5.1)
Sodium: 142 mmol/L (ref 135–145)
Total Bilirubin: 0.2 mg/dL — ABNORMAL LOW (ref 0.3–1.2)
Total Protein: 8 g/dL (ref 6.5–8.1)

## 2019-02-13 LAB — CBC WITH DIFFERENTIAL/PLATELET
Abs Immature Granulocytes: 0.02 10*3/uL (ref 0.00–0.07)
Basophils Absolute: 0 10*3/uL (ref 0.0–0.1)
Basophils Relative: 0 %
Eosinophils Absolute: 0 10*3/uL (ref 0.0–0.5)
Eosinophils Relative: 0 %
HCT: 45.6 % (ref 39.0–52.0)
Hemoglobin: 12.3 g/dL — ABNORMAL LOW (ref 13.0–17.0)
Immature Granulocytes: 0 %
Lymphocytes Relative: 18 %
Lymphs Abs: 1 10*3/uL (ref 0.7–4.0)
MCH: 18.4 pg — ABNORMAL LOW (ref 26.0–34.0)
MCHC: 27 g/dL — ABNORMAL LOW (ref 30.0–36.0)
MCV: 68.4 fL — ABNORMAL LOW (ref 80.0–100.0)
Monocytes Absolute: 0.3 10*3/uL (ref 0.1–1.0)
Monocytes Relative: 5 %
Neutro Abs: 4.2 10*3/uL (ref 1.7–7.7)
Neutrophils Relative %: 77 %
Platelets: 177 10*3/uL (ref 150–400)
RBC: 6.67 MIL/uL — ABNORMAL HIGH (ref 4.22–5.81)
RDW: 25.2 % — ABNORMAL HIGH (ref 11.5–15.5)
WBC: 5.4 10*3/uL (ref 4.0–10.5)
nRBC: 0 % (ref 0.0–0.2)

## 2019-02-13 LAB — ABO/RH: ABO/RH(D): O POS

## 2019-02-13 LAB — GLUCOSE, CAPILLARY
Glucose-Capillary: 157 mg/dL — ABNORMAL HIGH (ref 70–99)
Glucose-Capillary: 265 mg/dL — ABNORMAL HIGH (ref 70–99)
Glucose-Capillary: 289 mg/dL — ABNORMAL HIGH (ref 70–99)
Glucose-Capillary: 307 mg/dL — ABNORMAL HIGH (ref 70–99)
Glucose-Capillary: 313 mg/dL — ABNORMAL HIGH (ref 70–99)

## 2019-02-13 LAB — HEMOGLOBIN A1C
Hgb A1c MFr Bld: 8 % — ABNORMAL HIGH (ref 4.8–5.6)
Mean Plasma Glucose: 182.9 mg/dL

## 2019-02-13 LAB — MRSA PCR SCREENING: MRSA by PCR: NEGATIVE

## 2019-02-13 LAB — C-REACTIVE PROTEIN: CRP: 23.2 mg/dL — ABNORMAL HIGH (ref <1.0)

## 2019-02-13 MED ORDER — SODIUM CHLORIDE 0.9 % IV SOLN
INTRAVENOUS | Status: DC | PRN
  Administered 2019-02-13 – 2019-02-16 (×2): 1000 mL via INTRAVENOUS
  Administered 2019-02-20: 12:00:00 250 mL via INTRAVENOUS
  Administered 2019-02-21: 1000 mL via INTRAVENOUS

## 2019-02-13 MED ORDER — PANTOPRAZOLE SODIUM 40 MG PO TBEC
40.0000 mg | DELAYED_RELEASE_TABLET | Freq: Every day | ORAL | Status: DC
  Administered 2019-02-13 – 2019-02-22 (×10): 40 mg via ORAL
  Filled 2019-02-13 (×10): qty 1

## 2019-02-13 MED ORDER — ENOXAPARIN SODIUM 60 MG/0.6ML ~~LOC~~ SOLN
60.0000 mg | SUBCUTANEOUS | Status: DC
  Administered 2019-02-14 – 2019-02-15 (×2): 60 mg via SUBCUTANEOUS
  Filled 2019-02-13 (×4): qty 0.6

## 2019-02-13 MED ORDER — PRO-STAT SUGAR FREE PO LIQD
30.0000 mL | Freq: Three times a day (TID) | ORAL | Status: DC
  Administered 2019-02-13 – 2019-02-14 (×3): 30 mL via ORAL
  Filled 2019-02-13 (×3): qty 30

## 2019-02-13 MED ORDER — ACETAMINOPHEN 325 MG PO TABS
650.0000 mg | ORAL_TABLET | Freq: Four times a day (QID) | ORAL | Status: DC | PRN
  Administered 2019-02-14 – 2019-02-21 (×2): 650 mg via ORAL
  Filled 2019-02-13 (×2): qty 2

## 2019-02-13 MED ORDER — OXYCODONE HCL 5 MG PO TABS
5.0000 mg | ORAL_TABLET | Freq: Four times a day (QID) | ORAL | Status: DC
  Administered 2019-02-13 – 2019-02-16 (×10): 5 mg via ORAL
  Filled 2019-02-13 (×11): qty 1

## 2019-02-13 MED ORDER — MIRTAZAPINE 15 MG PO TABS
15.0000 mg | ORAL_TABLET | Freq: Every day | ORAL | Status: DC
  Administered 2019-02-13 – 2019-02-21 (×9): 15 mg via ORAL
  Filled 2019-02-13 (×10): qty 1

## 2019-02-13 MED ORDER — OXYCODONE HCL 5 MG PO TABS
5.0000 mg | ORAL_TABLET | Freq: Three times a day (TID) | ORAL | Status: DC | PRN
  Administered 2019-02-15 – 2019-02-22 (×3): 5 mg via ORAL
  Filled 2019-02-13 (×3): qty 1

## 2019-02-13 MED ORDER — TAMSULOSIN HCL 0.4 MG PO CAPS
0.4000 mg | ORAL_CAPSULE | Freq: Every day | ORAL | Status: DC
  Administered 2019-02-13 – 2019-02-21 (×9): 0.4 mg via ORAL
  Filled 2019-02-13 (×9): qty 1

## 2019-02-13 MED ORDER — ATORVASTATIN CALCIUM 40 MG PO TABS
80.0000 mg | ORAL_TABLET | Freq: Every day | ORAL | Status: DC
  Administered 2019-02-13 – 2019-02-22 (×10): 80 mg via ORAL
  Filled 2019-02-13 (×12): qty 2

## 2019-02-13 MED ORDER — ASPIRIN EC 81 MG PO TBEC
81.0000 mg | DELAYED_RELEASE_TABLET | ORAL | Status: DC
  Administered 2019-02-13 – 2019-02-22 (×10): 81 mg via ORAL
  Filled 2019-02-13 (×11): qty 1

## 2019-02-13 MED ORDER — ENOXAPARIN SODIUM 40 MG/0.4ML ~~LOC~~ SOLN
40.0000 mg | SUBCUTANEOUS | Status: DC
  Administered 2019-02-13: 09:00:00 40 mg via SUBCUTANEOUS
  Filled 2019-02-13: qty 0.4

## 2019-02-13 MED ORDER — AMITRIPTYLINE HCL 100 MG PO TABS
100.0000 mg | ORAL_TABLET | Freq: Every day | ORAL | Status: DC
  Administered 2019-02-14 – 2019-02-22 (×8): 100 mg via ORAL
  Filled 2019-02-13 (×10): qty 1

## 2019-02-13 MED ORDER — DULOXETINE HCL 20 MG PO CPEP
40.0000 mg | ORAL_CAPSULE | Freq: Every day | ORAL | Status: DC
  Administered 2019-02-14 – 2019-02-22 (×9): 40 mg via ORAL
  Filled 2019-02-13 (×11): qty 2

## 2019-02-13 MED ORDER — CARVEDILOL 3.125 MG PO TABS
3.1250 mg | ORAL_TABLET | Freq: Two times a day (BID) | ORAL | Status: DC
  Administered 2019-02-13 – 2019-02-22 (×19): 3.125 mg via ORAL
  Filled 2019-02-13 (×19): qty 1

## 2019-02-13 MED ORDER — OXYCODONE HCL ER 10 MG PO T12A: 10.0000 mg | EXTENDED_RELEASE_TABLET | Freq: Two times a day (BID) | ORAL | Status: DC

## 2019-02-13 MED ORDER — POLYETHYLENE GLYCOL 3350 17 G PO PACK: 17.0000 g | PACK | Freq: Every day | ORAL | Status: DC | PRN

## 2019-02-13 MED ORDER — TIZANIDINE HCL 4 MG PO TABS
4.0000 mg | ORAL_TABLET | Freq: Three times a day (TID) | ORAL | Status: DC | PRN
  Administered 2019-02-14 – 2019-02-16 (×3): 4 mg via ORAL
  Filled 2019-02-13 (×4): qty 1

## 2019-02-13 MED ORDER — INSULIN ASPART PROT & ASPART (70-30 MIX) 100 UNIT/ML ~~LOC~~ SUSP
50.0000 [IU] | Freq: Two times a day (BID) | SUBCUTANEOUS | Status: DC
  Administered 2019-02-13 – 2019-02-15 (×4): 50 [IU] via SUBCUTANEOUS
  Filled 2019-02-13: qty 10

## 2019-02-13 NOTE — Progress Notes (Signed)
I have reviewed patient's chart.  Admitted with sepsis, right pleural effusion and congestive heart failure.  COVID positive.  Due to risk of viral transmission we will delay echocardiogram until patient treated and covid negative.  Would continue therapy as outlined until that time.  Please call with questions. Kirk Ruths, MD

## 2019-02-13 NOTE — Progress Notes (Signed)
PROGRESS NOTE  [name]  WUX:[id number] DOB: [birth year] DOA: 02/12/2019 PCP: Charlsie Merles, MD   Brief Narrative: [name] is a 63 y.o. male with a history of chronic HFpEF on chronic oxygen, HTN, T2DM and chronic/recurrent lower extremity ulcers who presented to the ED from [facility] due to dyspnea and hypoxia in the setting of having had a positive covid test 2 days prior.    Assessment & Plan: Active Problems:   Dyspnea  Acute on chronic hypoxic respiratory failure:  - Continue supplemental oxygen, treat conditions as below  Right pleural effusion:  - Diuresis as below, if not improving, will need to perform thoracentesis.   Acute on chronic HFpEF:  - Continue lasix 40mg  IV BID.  - Strict I/O, daily weights - Echocardiogram felt by cardiology not to be necessary at this time.   Covid-19 infection: No multifocal opacities typical of covid pneumonia at the time of admission.  - Continue airborne, contact precautions. PPE including surgical gown, gloves, face shield, cap, shoe covers, and N-95 used during this encounter in a negative pressure room.  - Check daily labs: CBC w/diff, CMP, d-dimer, fibrinogen, ferritin, LDH, CRP - Enoxaparin prophylactic dose. - Procalcitonin reassuring at 0.12, CRP elevated at 21.9 but below previous levels. Ferritin, triglycerides wnl, LDH only 217.  - Blood cultures drawn at admission.  - Maintain euvolemia/net negative.  - Avoid NSAIDs - Recommend proning and aggressive use of incentive spirometry.  RLE ulcer:  - WOC consulted - Vanc, cefepime, flagyl  - Cultures  IDT2DM: Last HbA1c was 7.8%.  - Need tight control in setting of covid infection - 70/30 mix 50u BID WC.  - Carb-modified, heart healthy diet.  Hyperlipidemia:  - Continue statin  HTN:  - Lisinopril, coreg  Chronic pain syndrome:  - Continue home oxycontin and oxycodone prn.   Obesity: BMI 25.  - Dietitian consulted as malnutrition also suspected  DVT  prophylaxis: Lovenox Code Status: Full Family Communication: Called spouse, no answer. Disposition Plan: Uncertain, guarded prognosis  Consultants:   None  Procedures:   None  Antimicrobials:  Vancomycin, cefepime, flagyl   Subjective: Breathing is stable, says he's on oxygen normally but can't remember how much. No chest pain, has had a fever.   Objective: Vitals:   02/13/19 0800 02/13/19 0909 02/13/19 0917 02/13/19 1200  BP: 117/71  137/89 (!) 133/96  Pulse: 74 (!) 59 65 63  Resp: 20 (!) 7 19 14   Temp:   97.6 F (36.4 C)   TempSrc:   Oral   SpO2: 95% 93% 94% 91%  Weight:      Height:        Intake/Output Summary (Last 24 hours) at 02/13/2019 1213 Last data filed at 02/13/2019 1100 Gross per 24 hour  Intake 551.48 ml  Output 1800 ml  Net -1248.52 ml   Filed Weights   02/12/19 2104 02/13/19 0500  Weight: 120.2 kg 121.7 kg    Gen: Chronically ill-appearing male in no distress Pulm: Non-labored breathing supplemental oxygen. Very diminished R > L. No wheezing or rhonchi.  CV: Regular rate and rhythm. No murmur, rub, or gallop. No JVD, woody indurated edema bilateral LE's GI: Abdomen soft, non-tender, non-distended, with normoactive bowel sounds. No organomegaly or masses felt. Ext: Warm, no deformities Skin: Kerlix dressings bilateral LE's, no exudate. Neuro: Alert and oriented. No focal neurological deficits. Psych: Judgement and insight appear fair.  Data Reviewed: I have personally reviewed following labs and imaging studies  CBC: Recent Labs  Lab 02/12/19 1653 02/13/19 1015  WBC 5.5 5.4  NEUTROABS 3.8 4.2  HGB 11.1* 12.3*  HCT 39.0 45.6  MCV 66.7* 68.4*  PLT 155 725   Basic Metabolic Panel: Recent Labs  Lab 02/12/19 1653  NA 139  K 3.9  CL 100  CO2 30  GLUCOSE 194*  BUN 29*  CREATININE 1.12  CALCIUM 7.8*   GFR: Estimated Creatinine Clearance: 88.5 mL/min (by C-G formula based on SCr of 1.12 mg/dL). Liver Function Tests: Recent Labs   Lab 02/12/19 1653  AST 38  ALT 21  ALKPHOS 120  BILITOT 0.2*  PROT 7.4  ALBUMIN 2.7*   No results for input(s): LIPASE, AMYLASE in the last 168 hours. No results for input(s): AMMONIA in the last 168 hours. Coagulation Profile: No results for input(s): INR, PROTIME in the last 168 hours. Cardiac Enzymes: No results for input(s): CKTOTAL, CKMB, CKMBINDEX, TROPONINI in the last 168 hours. BNP (last 3 results) No results for input(s): PROBNP in the last 8760 hours. HbA1C: No results for input(s): HGBA1C in the last 72 hours. CBG: Recent Labs  Lab 02/13/19 0026 02/13/19 0412 02/13/19 0811  GLUCAP 265* 307* 313*   Lipid Profile: Recent Labs    02/12/19 1653  TRIG 136   Thyroid Function Tests: No results for input(s): TSH, T4TOTAL, FREET4, T3FREE, THYROIDAB in the last 72 hours. Anemia Panel: Recent Labs    02/12/19 1653  FERRITIN 99   Urine analysis:    Component Value Date/Time   COLORURINE YELLOW 11/02/2015 [place] 11/02/2015 2258   LABSPEC 1.021 11/02/2015 2258   PHURINE 5.0 11/02/2015 2258   GLUCOSEU NEGATIVE 11/02/2015 2258   HGBUR NEGATIVE 11/02/2015 2258   BILIRUBINUR NEGATIVE 11/02/2015 2258   KETONESUR NEGATIVE 11/02/2015 2258   PROTEINUR NEGATIVE 11/02/2015 2258   UROBILINOGEN 0.2 03/26/2015 0144   NITRITE NEGATIVE 11/02/2015 2258   LEUKOCYTESUR SMALL (A) 11/02/2015 2258   Recent Results (from the past 240 hour(s))  SARS Coronavirus 2 (CEPHEID- Performed in [facility] hospital lab), Hosp Order     Status: Abnormal   Collection Time: 02/12/19  7:18 PM  Result Value Ref Range Status   SARS Coronavirus 2 POSITIVE (A) NEGATIVE Final    Comment: RESULT CALLED TO, READ BACK BY AND VERIFIED WITH: O.IDOWU AT 2042 ON 02/12/19 BY N.THOMPSON (NOTE) If result is NEGATIVE SARS-CoV-2 target nucleic acids are NOT DETECTED. The SARS-CoV-2 RNA is generally detectable in upper and lower  respiratory specimens during the acute phase of  infection. The lowest  concentration of SARS-CoV-2 viral copies this assay can detect is 250  copies / mL. A negative result does not preclude SARS-CoV-2 infection  and should not be used as the sole basis for treatment or other  patient management decisions.  A negative result may occur with  improper specimen collection / handling, submission of specimen other  than nasopharyngeal swab, presence of viral mutation(s) within the  areas targeted by this assay, and inadequate number of viral copies  (<250 copies / mL). A negative result must be combined with clinical  observations, patient history, and epidemiological information. If result is POSITIVE SARS-CoV-2 target nucleic acids are DETECTE D. The SARS-CoV-2 RNA is generally detectable in upper and lower  respiratory specimens during the acute phase of infection.  Positive  results are indicative of active infection with SARS-CoV-2.  Clinical  correlation with patient history and other diagnostic information is  necessary to determine patient infection status.  Positive results do  not rule out bacterial infection or co-infection with other viruses. If result is PRESUMPTIVE POSTIVE SARS-CoV-2 nucleic acids MAY BE PRESENT.   A presumptive positive result was obtained on the submitted specimen  and confirmed on repeat testing.  While 2019 novel coronavirus  (SARS-CoV-2) nucleic acids may be present in the submitted sample  additional confirmatory testing may be necessary for epidemiological  and / or clinical management purposes  to differentiate between  SARS-CoV-2 and other Sarbecovirus currently known to infect humans.  If clinically indicated additional testing with an alternate test  methodology (LAB745 3) is advised. The SARS-CoV-2 RNA is generally  detectable in upper and lower respiratory specimens during the acute  phase of infection. The expected result is Negative. Fact Sheet for Patients:   StrictlyIdeas.no Fact Sheet for Healthcare Providers: BankingDealers.co.za This test is not yet approved or cleared by the [place] FDA and has been authorized for detection and/or diagnosis of SARS-CoV-2 by FDA under an Emergency Use Authorization (EUA).  This EUA will remain in effect (meaning this test can be used) for the duration of the COVID-19 declaration under Section 564(b)(1) of the Act, 21 U.S.C. section 360bbb-3(b)(1), unless the authorization is terminated or revoked sooner. Performed at [facility], [place] [address], [place], [place] [address]       Radiology Studies: Dg Chest Port 1 View  Result Date: 02/12/2019 CLINICAL DATA:  Shortness of breath EXAM: PORTABLE CHEST 1 VIEW COMPARISON:  Chest CT 08/21/2016 FINDINGS: Large right pleural effusion. Right basilar atelectasis. Cardiomediastinal contours are normal. No acute osseous abnormality. IMPRESSION: Large right pleural effusion with right basilar atelectasis. Electronically Signed   By: Ulyses Jarred M.D.   On: 02/12/2019 18:12    Scheduled Meds: . [START ON 02/14/2019] amitriptyline  100 mg Oral QHS  . aspirin EC  81 mg Oral BH-q7a  . atorvastatin  80 mg Oral Daily  . carvedilol  3.125 mg Oral BID WC  . [START ON 02/14/2019] DULoxetine  40 mg Oral Daily  . enoxaparin (LOVENOX) injection  40 mg Subcutaneous Q24H  . furosemide  40 mg Intravenous Q12H  . insulin aspart  0-15 Units Subcutaneous Q4H   Continuous Infusions: . sodium chloride 1,000 mL (02/13/19 1202)  . ceFEPime (MAXIPIME) IV 2 g (02/13/19 0525)  . metronidazole 500 mg (02/13/19 1203)  . vancomycin 1,000 mg (02/13/19 0857)     LOS: 1 day   Time spent: 35 minutes.  Patrecia Pour, MD [facility] [email or web address] Password [facility] 02/13/2019, 12:13 PM

## 2019-02-13 NOTE — Progress Notes (Signed)
PHARMACY - PHYSICIAN COMMUNICATION CRITICAL VALUE ALERT - BLOOD CULTURE IDENTIFICATION (BCID)  [name] is an 63 y.o. male who presented to [facility] on 02/12/2019 with a chief complaint of Acute on chronic hypoxic respiratory failure, COVID-19 infection, and chronic/recurrent lower extremity diabetic ulcers.  Assessment:  Possibly related to diabetic ulcers.  Likely contaminant with 1/4 bottles positive for methicillin resistant Staph species.  Name of physician (or Provider) Contacted: Dr. Bonner Puna  Current antibiotics: Vancomycin, Cefepime, Metronidazole  Changes to prescribed antibiotics recommended:  Patient is on recommended antibiotics - No changes needed  Continue current broad spectrum antibiotics for 24 hours pending follow up of ulcers and clinical condition.  Results for orders placed or performed during the hospital encounter of 02/12/19  Blood Culture ID Panel (Reflexed) (Collected: 02/12/2019  4:53 PM)  Result Value Ref Range   Enterococcus species NOT DETECTED NOT DETECTED   Listeria monocytogenes NOT DETECTED NOT DETECTED   Staphylococcus species DETECTED (A) NOT DETECTED   Staphylococcus aureus (BCID) NOT DETECTED NOT DETECTED   Methicillin resistance DETECTED (A) NOT DETECTED   Streptococcus species NOT DETECTED NOT DETECTED   Streptococcus agalactiae NOT DETECTED NOT DETECTED   Streptococcus pneumoniae NOT DETECTED NOT DETECTED   Streptococcus pyogenes NOT DETECTED NOT DETECTED   Acinetobacter baumannii NOT DETECTED NOT DETECTED   Enterobacteriaceae species NOT DETECTED NOT DETECTED   Enterobacter cloacae complex NOT DETECTED NOT DETECTED   Escherichia coli NOT DETECTED NOT DETECTED   Klebsiella oxytoca NOT DETECTED NOT DETECTED   Klebsiella pneumoniae NOT DETECTED NOT DETECTED   Proteus species NOT DETECTED NOT DETECTED   Serratia marcescens NOT DETECTED NOT DETECTED   Haemophilus influenzae NOT DETECTED NOT DETECTED   Neisseria meningitidis NOT DETECTED NOT  DETECTED   Pseudomonas aeruginosa NOT DETECTED NOT DETECTED   Candida albicans NOT DETECTED NOT DETECTED   Candida glabrata NOT DETECTED NOT DETECTED   Candida krusei NOT DETECTED NOT DETECTED   Candida parapsilosis NOT DETECTED NOT DETECTED   Candida tropicalis NOT DETECTED NOT DETECTED     Gretta Arab PharmD, BCPS 02/13/2019 2:09 PM

## 2019-02-13 NOTE — Evaluation (Signed)
Clinical/Bedside Swallow Evaluation Patient Details  Name: [name] MRN: [id number] Date of Birth: [birth year]  Today's Date: 02/13/2019 Time: SLP Start Time (ACUTE ONLY): 1140 SLP Stop Time (ACUTE ONLY): 1150 SLP Time Calculation (min) (ACUTE ONLY): 10 min  Past Medical History:  Past Medical History:  Diagnosis Date  . Anemia   . Arthritis    "left hip" (04/26/2015)  . Cellulitis and abscess of leg hositalized 04/25/2015   left  . Chronic hip pain   . Depression   . GERD (gastroesophageal reflux disease)   . Hypercholesterolemia   . Hypertension   . Pneumonia 1960's X 1  . Sleep apnea    "couldn't wear the mask" (04/26/2015)  . Thalassemia minor   . Type II diabetes mellitus ([place])    Past Surgical History:  Past Surgical History:  Procedure Laterality Date  . INGUINAL HERNIA REPAIR Right ~ 2004  . SHOULDER ARTHROSCOPY W/ ROTATOR CUFF REPAIR Right ~ 2004  . TOENAIL EXCISION Right 1980's X 2   "big toe"   HPI:  [name] is a 63 y.o. male with medical history significant for DM, hypertension, diastolic CHF, who was brought to the ED from [place] home with complaints of increased difficulty breathing, and low oxygen.  dx with covid 19. Arrival patient's O2 sats were 80%, on 6 L nasal cannula, he was subsequently placed on nonrebreather with sats increasing to 96%. Found to have large right pleural effusion with atelectasis likely from decompensated CHF also, and possibly COVID related bronchitis contributing to hypoxemia.    Assessment / Plan / Recommendation Clinical Impression  Pt demonstrates normal swallow function consumed several oz of water consecutively, no coughing observed. Able to masticate without dentition. Recommend a regular diet and thin liquids. No SLP f/u needed will sign off.  SLP Visit Diagnosis: Dysphagia, unspecified (R13.10)    Aspiration Risk  Mild aspiration risk    Diet Recommendation Regular;Thin liquid   Liquid Administration via:  Cup;Straw Medication Administration: Whole meds with liquid Supervision: Patient able to self feed Compensations: Slow rate;Small sips/bites Postural Changes: Seated upright at 90 degrees    Other  Recommendations     Follow up Recommendations        Frequency and Duration            Prognosis        Swallow Study   General HPI: [name] is a 63 y.o. male with medical history significant for DM, hypertension, diastolic CHF, who was brought to the ED from [place] home with complaints of increased difficulty breathing, and low oxygen.  dx with covid 19. Arrival patient's O2 sats were 80%, on 6 L nasal cannula, he was subsequently placed on nonrebreather with sats increasing to 96%. Found to have large right pleural effusion with atelectasis likely from decompensated CHF also, and possibly COVID related bronchitis contributing to hypoxemia.  Type of Study: Bedside Swallow Evaluation Diet Prior to this Study: NPO Temperature Spikes Noted: No Respiratory Status: Nasal cannula History of Recent Intubation: No Behavior/Cognition: Alert;Cooperative Oral Cavity Assessment: Dry Oral Care Completed by SLP: No Oral Cavity - Dentition: Edentulous Self-Feeding Abilities: Able to feed self Patient Positioning: Upright in bed Baseline Vocal Quality: Normal Volitional Cough: Strong Volitional Swallow: Able to elicit    Oral/Motor/Sensory Function Overall Oral Motor/Sensory Function: Within functional limits   Ice Chips     Thin Liquid Thin Liquid: Within functional limits Presentation: Straw;Self Fed    Nectar Thick Nectar Thick Liquid: Not  tested   Honey Thick Honey Thick Liquid: Not tested   Puree Puree: Not tested   Solid     Solid: Within functional limits Presentation: [name] Deyani Hegarty, MA [place] Pager [phone] Office [phone]  Lynann Beaver 02/13/2019,11:58 AM

## 2019-02-13 NOTE — Progress Notes (Signed)
New admit 63y/o male a/o x4 extremly deconditioned- hx DM with bil diabetic ulcers- we covered with foam and kerlix for lack f other dressing supplies- wound care consult placed- usually pt states he has umaboots in place- note dhe had cellulitis in both lower extremities. He had some healed ulcers on his sacral/ buttuocks- and foam dressing placed  prophalaticly

## 2019-02-14 DIAGNOSIS — U071 COVID-19: Secondary | ICD-10-CM

## 2019-02-14 DIAGNOSIS — I5031 Acute diastolic (congestive) heart failure: Secondary | ICD-10-CM

## 2019-02-14 DIAGNOSIS — J90 Pleural effusion, not elsewhere classified: Secondary | ICD-10-CM

## 2019-02-14 DIAGNOSIS — E1169 Type 2 diabetes mellitus with other specified complication: Secondary | ICD-10-CM

## 2019-02-14 LAB — CBC WITH DIFFERENTIAL/PLATELET
Abs Immature Granulocytes: 0.02 10*3/uL (ref 0.00–0.07)
Basophils Absolute: 0 10*3/uL (ref 0.0–0.1)
Basophils Relative: 0 %
Eosinophils Absolute: 0 10*3/uL (ref 0.0–0.5)
Eosinophils Relative: 0 %
HCT: 40.8 % (ref 39.0–52.0)
Hemoglobin: 11.6 g/dL — ABNORMAL LOW (ref 13.0–17.0)
Immature Granulocytes: 0 %
Lymphocytes Relative: 22 %
Lymphs Abs: 1 10*3/uL (ref 0.7–4.0)
MCH: 19.1 pg — ABNORMAL LOW (ref 26.0–34.0)
MCHC: 28.4 g/dL — ABNORMAL LOW (ref 30.0–36.0)
MCV: 67.1 fL — ABNORMAL LOW (ref 80.0–100.0)
Monocytes Absolute: 0.3 10*3/uL (ref 0.1–1.0)
Monocytes Relative: 5 %
Neutro Abs: 3.4 10*3/uL (ref 1.7–7.7)
Neutrophils Relative %: 73 %
Platelets: 148 10*3/uL — ABNORMAL LOW (ref 150–400)
RBC: 6.08 MIL/uL — ABNORMAL HIGH (ref 4.22–5.81)
RDW: 24.4 % — ABNORMAL HIGH (ref 11.5–15.5)
WBC: 4.8 10*3/uL (ref 4.0–10.5)
nRBC: 0 % (ref 0.0–0.2)

## 2019-02-14 LAB — COMPREHENSIVE METABOLIC PANEL
ALT: 20 U/L (ref 0–44)
AST: 31 U/L (ref 15–41)
Albumin: 2.7 g/dL — ABNORMAL LOW (ref 3.5–5.0)
Alkaline Phosphatase: 105 U/L (ref 38–126)
Anion gap: 8 (ref 5–15)
BUN: 26 mg/dL — ABNORMAL HIGH (ref 8–23)
CO2: 34 mmol/L — ABNORMAL HIGH (ref 22–32)
Calcium: 7.8 mg/dL — ABNORMAL LOW (ref 8.9–10.3)
Chloride: 99 mmol/L (ref 98–111)
Creatinine, Ser: 0.94 mg/dL (ref 0.61–1.24)
GFR calc Af Amer: >60 mL/min (ref >60)
GFR calc non Af Amer: >60 mL/min (ref >60)
Glucose, Bld: 129 mg/dL — ABNORMAL HIGH (ref 70–99)
Potassium: 3.9 mmol/L (ref 3.5–5.1)
Sodium: 141 mmol/L (ref 135–145)
Total Bilirubin: 0.5 mg/dL (ref 0.3–1.2)
Total Protein: 7.4 g/dL (ref 6.5–8.1)

## 2019-02-14 LAB — URINE CULTURE: Culture: NO GROWTH

## 2019-02-14 LAB — GLUCOSE, CAPILLARY
Glucose-Capillary: 123 mg/dL — ABNORMAL HIGH (ref 70–99)
Glucose-Capillary: 128 mg/dL — ABNORMAL HIGH (ref 70–99)
Glucose-Capillary: 166 mg/dL — ABNORMAL HIGH (ref 70–99)
Glucose-Capillary: 167 mg/dL — ABNORMAL HIGH (ref 70–99)
Glucose-Capillary: 93 mg/dL (ref 70–99)

## 2019-02-14 LAB — C-REACTIVE PROTEIN: CRP: 14.7 mg/dL — ABNORMAL HIGH (ref <1.0)

## 2019-02-14 LAB — HIV ANTIBODY (ROUTINE TESTING W REFLEX): HIV Screen 4th Generation wRfx: NONREACTIVE

## 2019-02-14 MED ORDER — JUVEN PO PACK
1.0000 | PACK | Freq: Two times a day (BID) | ORAL | Status: DC
  Administered 2019-02-14 – 2019-02-22 (×16): 1 via ORAL
  Filled 2019-02-14 (×19): qty 1

## 2019-02-14 MED ORDER — ENSURE ENLIVE PO LIQD
237.0000 mL | ORAL | Status: DC
  Administered 2019-02-14 – 2019-02-21 (×4): 237 mL via ORAL

## 2019-02-14 MED ORDER — PRO-STAT SUGAR FREE PO LIQD
30.0000 mL | Freq: Three times a day (TID) | ORAL | Status: DC
  Administered 2019-02-14 – 2019-02-22 (×22): 30 mL via ORAL
  Filled 2019-02-14 (×19): qty 30

## 2019-02-14 NOTE — Consult Note (Addendum)
[facility] Nurse wound consult note Patient receiving care in [place].  The room does not have a camera or video phone, and there are no images in the chart.  I spoke with the primary nurse, Olivia Mackie, via telephone Reason for Consult: chronic lower extremity wounds Due to the nature of this patient's COVID-19 infection with isolation and in keeping with efforts to prevent the spread of infection and to conserve personal protective equipment, a physical exam was not personally performed.  Patient's symptoms and exam were discussed in detail with the RN.  A chart review of other providers notes was performed.  Exam details from prior documentation were reviewed .   Wound type: unclear at this time.  Nurse states the patient usually wears unna boot wraps.  These have been removed and there are scattered wounds to BLE.  The staff have covered t Measurement: To be provided by the bedside RN Wound bed: To be provided by bedside RN Drainage (amount, consistency, odor) To be provided by bedside RN Periwound: To be provided by bedside RN Dressing procedure/placement/frequency:  Cleanse wounds on bilateral lower legs with saline. Pat dry. Apply foam dressings over open wounds. Secure with kerlex if needed. Change daily. Monitor the wound area(s) for worsening of condition such as: Signs/symptoms of infection,  Increase in size,  Development of or worsening of odor, Development of pain, or increased pain at the affected locations.  Notify the medical team if any of these develop.  Thank you for the consult.  Discussed plan of care with the bedside nurse.  [facility] nurse will not follow at this time.  Please re-consult the [facility] team if needed.  Val Riles, RN, MSN, CWOCN, CNS-BC, pager [phone]

## 2019-02-14 NOTE — Progress Notes (Signed)
Dressings changed to bilateral lower extremities. 1 wound on right lower extremity and multiple wounds running together on left lower extremities including most of posterior calf. Areas with sloughing tissue, moderate yellowish drainage and scant blood. Wounds with strong malodor. Foam dressing noted to be saturated with drainage but contained by foam. Attempt to return call to wound nurse that called earlier @ [phone] with no answer or answering machine. Page sent to [phone] with [phone] entered from return call to discuss other treatment options. Currently foam wounds and Kerlix applied to all wounds per order.

## 2019-02-14 NOTE — Progress Notes (Signed)
Initial Nutrition Assessment  RD working remotely.   DOCUMENTATION CODES:   Obesity unspecified  INTERVENTION:  - continue 30 mL Prostat BID, each supplement provides 100 kcal and 15 grams of protein. - will order Ensure Enlive once/day, each supplement provides 350 kcal and 20 grams of protein. - will order Juven BID, each packet provides 90 calories, 2.5 grams of protein, 8 grams of carbohydrate, and 14 grams of amino acids; supplement contains CaHMB, glutamine, and arginine, to promote wound healing. - continue to encourage PO intakes.    NUTRITION DIAGNOSIS:   Increased nutrient needs related to acute illness, wound healing as evidenced by estimated needs.  GOAL:   Patient will meet greater than or equal to 90% of their needs  MONITOR:   PO intake, Supplement acceptance, Labs, Weight trends, Skin  REASON FOR ASSESSMENT:   Consult Assessment of nutrition requirement/status  ASSESSMENT:   63 y.o. male with medical history significant for DM, HTN, and CHF. He presented to the ED from [place] home with complaints of increased difficulty breathing and low oxygen. On arrival patient's O2 sats were 80% on 6L nasal cannula and he was subsequently placed on nonrebreather with sats increasing to 96%. Patient was diagnosed with COVID-19 at nursing home 2 days PTA.   Patient was admitted 5/22 from [facility]. Order was placed for 30 ml prostat TID and patient has consumed all 3 packets offered. Patient reports good appetite/appetite at baseline with no chewing or swallowing difficulties. Patient is able to self-feed. Notes do indicate severe deconditioning d/t ongoing DM ulcers to bilateral legs (flow sheet indicates ulcers on only L leg at this time).   Per chart review, current weight is 268 lb and weight on 3/8 was 272 lb. This indicates 4 lb weight loss (1.5% body weight) in the past 2.5 months; not significant for time frame.  Per notes: acute on chronic hypoxic  respiratory failure, R pleural effusion with diuretic ordered, acute on CHF, COVID-19 positive, Type 2 DM with last HgbA1c: 7.8%.   Medications reviewed; 40 mg IV lasix BID, sliding scale novolog, 50 units 70/30 BID. Labs reviewed; CBGs: 166, 128, and 167 mg/dl today, BUN: 26 mg/dl, Ca: 7.8 mg/dl.    NUTRITION - FOCUSED PHYSICAL EXAM:  unable to perform while patient is at [facility].  Diet Order:   Diet Order            Diet heart healthy/carb modified Room service appropriate? Yes; Fluid consistency: Thin  Diet effective now              EDUCATION NEEDS:   Not appropriate for education at this time  Skin:  Skin Assessment: Skin Integrity Issues: Skin Integrity Issues:: Diabetic Ulcer Diabetic Ulcer: L leg x2  Last BM:  5/24  Height:   Ht Readings from Last 1 Encounters:  02/12/19 6' (1.829 m)    Weight:   Wt Readings from Last 1 Encounters:  02/13/19 121.7 kg    Ideal Body Weight:  80.9 kg  BMI:  Body mass index is 36.39 kg/m.  Estimated Nutritional Needs:   Kcal:  2340-2560 kcal  Protein:  120-135 grams  Fluid:  >/= 2.3 L/day     Jarome Matin, MS, RD, LDN, [facility] Inpatient Clinical Dietitian Pager # [phone] After hours/weekend pager # [phone]

## 2019-02-14 NOTE — Progress Notes (Signed)
PROGRESS NOTE                                                                                                                                                                                                             Patient Demographics:    [name], is a 63 y.o. male, DOB - Sep 17, 1956, QJF:[id number]  Outpatient Primary MD for the patient is Borum, Jaci Standard, MD    LOS - 2  Chief Complaint  Patient presents with  . Shortness of Breath  . Fever       Brief Narrative: Patient is a 63 y.o. male with PMHx of phonic lower extremity wounds, chronic diastolic heart failure, chronic hypoxic respiratory failure on home O2, HTN, DM-2-presented with dyspnea-found to have acute hypoxic respiratory failure in the setting of pleural effusion, decompensated diastolic heart failure and was also found to have COVID-19 infection.   Subjective:    [facility] lying comfortably in bed-he was lying flat-was not in any sort of distress.  Thinks is breathing is better than yesterday.   Assessment  & Plan :    Acute Hypoxic Resp Failure due to decompensated diastolic heart failure and Covid 19 Viral pneumonia: Improved with supportive care and diuretics.  Inflammatory markers are downtrending.  Continue Lasix-low improved-still has some excess volume on board-negative balance of 1.5 L so far.     COVID-19 Labs:  Recent Labs    02/12/19 1653 02/13/19 1015 02/14/19 0525  DDIMER 0.46  --   --   FERRITIN 99  --   --   LDH 217*  --   --   CRP 21.9* 23.2* 14.7*    Lab Results  Component Value Date   SARSCOV2NAA POSITIVE (A) 02/12/2019     COVID-19 Medications: None  Right-sided pleural effusion: Suspect this is a transudate in the setting of decompensated heart failure.  Since patient is improving with diuretics-no indications to perform thoracocentesis at this point.  Bilateral lower extremity ulceration: Appreciate wound  care input-continue empiric vancomycin, cefepime and Flagyl.  1 out of 2 blood cultures positive for coag negative staph-likely a contaminant.  DM-2: CBG stable-continue insulin 70/30 50 units twice daily and SSI.  HTN: Controlled-continue Coreg and lisinopril  Dyslipidemia: Continue statin  Chronic pain syndrome: Stable-continue home regimen of narcotics  Depression: Stable with Cymbalta, Remeron  BPH:  Continue Flomax  Morbid obesity    ABG:    Component Value Date/Time   PHART 7.304 (L) 02/12/2019 2050   PCO2ART 66.9 ([place]) 02/12/2019 2050   PO2ART 73.1 (L) 02/12/2019 2050   HCO3 32.2 (H) 02/12/2019 2050   TCO2 21 11/02/2015 2039   ACIDBASEDEF 6.0 (H) 11/02/2015 2039   O2SAT 90.2 02/12/2019 2050    Condition -gaurded  Family Communication  : Called spouse-no answer  Code Status :  Full Code  Diet : Heart healthy/diabetic  Disposition Plan  :  Remain inpatient  Consults  : None  Procedures  :  None  DVT Prophylaxis  :  Lovenox   Lab Results  Component Value Date   PLT 148 (L) 02/14/2019    Inpatient Medications  Scheduled Meds: . amitriptyline  100 mg Oral QHS  . aspirin EC  81 mg Oral BH-q7a  . atorvastatin  80 mg Oral Daily  . carvedilol  3.125 mg Oral BID WC  . DULoxetine  40 mg Oral Daily  . enoxaparin (LOVENOX) injection  60 mg Subcutaneous Q24H  . feeding supplement (ENSURE ENLIVE)  237 mL Oral Q24H  . feeding supplement (PRO-STAT SUGAR FREE 64)  30 mL Oral TID WC  . furosemide  40 mg Intravenous Q12H  . insulin aspart  0-15 Units Subcutaneous Q4H  . insulin aspart protamine- aspart  50 Units Subcutaneous BID WC  . mirtazapine  15 mg Oral QHS  . nutrition supplement (JUVEN)  1 packet Oral BID BM  . oxyCODONE  5 mg Oral Q6H  . pantoprazole  40 mg Oral Daily  . tamsulosin  0.4 mg Oral QPC supper   Continuous Infusions: . sodium chloride 10 mL/hr at 02/14/19 1100  . ceFEPime (MAXIPIME) IV 2 g (02/14/19 1342)  . metronidazole 500 mg (02/14/19  1200)  . vancomycin Stopped (02/14/19 1025)   PRN Meds:.sodium chloride, acetaminophen, albuterol, oxyCODONE, polyethylene glycol, tiZANidine  Antibiotics  :    Anti-infectives (From admission, onward)   Start     Dose/Rate Route Frequency Ordered Stop   02/13/19 1000  vancomycin (VANCOCIN) IVPB 1000 mg/200 mL premix     1,000 mg 200 mL/hr over 60 Minutes Intravenous Every 12 hours 02/12/19 2111     02/13/19 0600  ceFEPIme (MAXIPIME) 2 g in sodium chloride 0.9 % 100 mL IVPB     2 g 200 mL/hr over 30 Minutes Intravenous Every 8 hours 02/12/19 2111     02/12/19 2115  ceFEPIme (MAXIPIME) 2 g in sodium chloride 0.9 % 100 mL IVPB     2 g 200 mL/hr over 30 Minutes Intravenous  Once 02/12/19 2102 02/12/19 2155   02/12/19 2115  vancomycin (VANCOCIN) 2,500 mg in sodium chloride 0.9 % 500 mL IVPB     2,500 mg 250 mL/hr over 120 Minutes Intravenous  Once 02/12/19 2111 02/13/19 0010   02/12/19 2100  metroNIDAZOLE (FLAGYL) IVPB 500 mg     500 mg 100 mL/hr over 60 Minutes Intravenous Every 8 hours 02/12/19 2046         Time Spent in minutes  25   Oren Binet M.D on 02/14/2019 at 3:19 PM  To page go to [email or web address] - use universal password  [facility] -  Office  [phone]  Admit date - 02/12/2019    2    Objective:   Vitals:   02/14/19 0800 02/14/19 0905 02/14/19 1100 02/14/19 1102  BP: 130/71   (!) 121/91  Pulse:   89 87  Resp: Marland Kitchen)  31 (!) 25 (!) 31 (!) 25  Temp:  (!) 101.3 F (38.5 C) 100.2 F (37.9 C)   TempSrc:  Oral    SpO2:   92% 94%  Weight:      Height:        Wt Readings from Last 3 Encounters:  02/13/19 121.7 kg  11/28/17 123.7 kg  11/03/17 120.7 kg     Intake/Output Summary (Last 24 hours) at 02/14/2019 1519 Last data filed at 02/14/2019 1100 Gross per 24 hour  Intake 1794.5 ml  Output 2100 ml  Net -305.5 ml     Physical Exam Gen Exam:Alert awake-not in any distress HEENT:atraumatic, normocephalic Chest: B/L clear to auscultation  anteriorly CVS:S1S2 regular Abdomen:soft non tender, non distended Extremities:no edema Neurology: Non focal Skin: no rash   Data Review:    CBC Recent Labs  Lab 02/12/19 1653 02/13/19 1015 02/14/19 0525  WBC 5.5 5.4 4.8  HGB 11.1* 12.3* 11.6*  HCT 39.0 45.6 40.8  PLT 155 177 148*  MCV 66.7* 68.4* 67.1*  MCH 19.0* 18.4* 19.1*  MCHC 28.5* 27.0* 28.4*  RDW 24.1* 25.2* 24.4*  LYMPHSABS 1.4 1.0 1.0  MONOABS 0.3 0.3 0.3  EOSABS 0.0 0.0 0.0  BASOSABS 0.0 0.0 0.0    Chemistries  Recent Labs  Lab 02/12/19 1653 02/13/19 1015 02/14/19 0525  NA 139 142 141  K 3.9 4.2 3.9  CL 100 97* 99  CO2 30 34* 34*  GLUCOSE 194* 338* 129*  BUN 29* 29* 26*  CREATININE 1.12 1.18 0.94  CALCIUM 7.8* 7.9* 7.8*  AST 38 38 31  ALT 21 25 20   ALKPHOS 120 125 105  BILITOT 0.2* 0.2* 0.5   ------------------------------------------------------------------------------------------------------------------ Recent Labs    02/12/19 1653  TRIG 136    Lab Results  Component Value Date   HGBA1C 8.0 (H) 02/13/2019   ------------------------------------------------------------------------------------------------------------------ No results for input(s): TSH, T4TOTAL, T3FREE, THYROIDAB in the last 72 hours.  Invalid input(s): FREET3 ------------------------------------------------------------------------------------------------------------------ Recent Labs    02/12/19 1653  FERRITIN 99    Coagulation profile No results for input(s): INR, PROTIME in the last 168 hours.  Recent Labs    02/12/19 1653  DDIMER 0.46    Cardiac Enzymes No results for input(s): CKMB, TROPONINI, MYOGLOBIN in the last 168 hours.  Invalid input(s): CK ------------------------------------------------------------------------------------------------------------------    Component Value Date/Time   BNP 34.2 02/12/2019 1653    Micro Results Recent Results (from the past 240 hour(s))  Blood Culture  (routine x 2)     Status: None (Preliminary result)   Collection Time: 02/12/19  4:24 PM  Result Value Ref Range Status   Specimen Description   Final    BLOOD LEFT FOREARM Performed at [facility], [place] [address] [place], [place] [address]    Special Requests   Final    BOTTLES DRAWN AEROBIC AND ANAEROBIC Blood Culture adequate volume Performed at [facility] [address], [place], [place] [address]    Culture   Final    NO GROWTH 2 DAYS Performed at [facility] [address], [place], [place] [address]    Report Status PENDING  Incomplete  Blood Culture (routine x 2)     Status: Abnormal (Preliminary result)   Collection Time: 02/12/19  4:53 PM  Result Value Ref Range Status   Specimen Description   Final    BLOOD RIGHT ANTECUBITAL Performed at [facility] [address] [place], [place] [address]  Special Requests   Final    BOTTLES DRAWN AEROBIC AND ANAEROBIC Blood Culture adequate volume Performed at [facility] [address] [place], [place] [address]    Culture  Setup Time   Final    GRAM POSITIVE COCCI IN CLUSTERS ANAEROBIC BOTTLE ONLY CRITICAL RESULT CALLED TO, READ BACK BY AND VERIFIED WITH: C. SHADE, PHARMD ([place]) AT 1410 ON 02/13/19 BY C. JESSUP, MLT. Performed at [facility], [place] [address], [place], [place] [address]    Culture STAPHYLOCOCCUS SPECIES (COAGULASE NEGATIVE) (A)  Final   Report Status PENDING  Incomplete  Blood Culture ID Panel (Reflexed)     Status: Abnormal   Collection Time: 02/12/19  4:53 PM  Result Value Ref Range Status   Enterococcus species NOT DETECTED NOT DETECTED Final   Listeria monocytogenes NOT DETECTED NOT DETECTED Final   Staphylococcus species DETECTED (A) NOT DETECTED Final    Comment: Methicillin (oxacillin) resistant coagulase negative staphylococcus. Possible blood culture contaminant (unless isolated from more than one blood  culture draw or clinical case suggests pathogenicity). No antibiotic treatment is indicated for blood  culture contaminants. CRITICAL RESULT CALLED TO, READ BACK BY AND VERIFIED WITH: C. SHADE, PHARMD ([place]) AT 1410 ON 02/13/19 BY C. JESSUP, MLT.    Staphylococcus aureus (BCID) NOT DETECTED NOT DETECTED Final   Methicillin resistance DETECTED (A) NOT DETECTED Final    Comment: CRITICAL RESULT CALLED TO, READ BACK BY AND VERIFIED WITH: C. SHADE, PHARMD ([place]) AT 1410 ON 02/13/19 BY C. JESSUP, MLT.    Streptococcus species NOT DETECTED NOT DETECTED Final   Streptococcus agalactiae NOT DETECTED NOT DETECTED Final   Streptococcus pneumoniae NOT DETECTED NOT DETECTED Final   Streptococcus pyogenes NOT DETECTED NOT DETECTED Final   Acinetobacter baumannii NOT DETECTED NOT DETECTED Final   Enterobacteriaceae species NOT DETECTED NOT DETECTED Final   Enterobacter cloacae complex NOT DETECTED NOT DETECTED Final   Escherichia coli NOT DETECTED NOT DETECTED Final   Klebsiella oxytoca NOT DETECTED NOT DETECTED Final   Klebsiella pneumoniae NOT DETECTED NOT DETECTED Final   Proteus species NOT DETECTED NOT DETECTED Final   Serratia marcescens NOT DETECTED NOT DETECTED Final   Haemophilus influenzae NOT DETECTED NOT DETECTED Final   Neisseria meningitidis NOT DETECTED NOT DETECTED Final   Pseudomonas aeruginosa NOT DETECTED NOT DETECTED Final   Candida albicans NOT DETECTED NOT DETECTED Final   Candida glabrata NOT DETECTED NOT DETECTED Final   Candida krusei NOT DETECTED NOT DETECTED Final   Candida parapsilosis NOT DETECTED NOT DETECTED Final   Candida tropicalis NOT DETECTED NOT DETECTED Final    Comment: Performed at [facility], [place] [address] [place], [place] [address]  SARS Coronavirus 2 (CEPHEID- Performed in [facility] hospital lab), Hosp Order     Status: Abnormal   Collection Time: 02/12/19  7:18 PM  Result Value Ref Range Status   SARS Coronavirus 2 POSITIVE (A) NEGATIVE Final     Comment: RESULT CALLED TO, READ BACK BY AND VERIFIED WITH: O.IDOWU AT 2042 ON 02/12/19 BY N.THOMPSON (NOTE) If result is NEGATIVE SARS-CoV-2 target nucleic acids are NOT DETECTED. The SARS-CoV-2 RNA is generally detectable in upper and lower  respiratory specimens during the acute phase of infection. The lowest  concentration of SARS-CoV-2 viral copies this assay can detect is 250  copies / mL. A negative result does not preclude SARS-CoV-2 infection  and should not be used as the sole basis for treatment or other  patient management  decisions.  A negative result may occur with  improper specimen collection / handling, submission of specimen other  than nasopharyngeal swab, presence of viral mutation(s) within the  areas targeted by this assay, and inadequate number of viral copies  (<250 copies / mL). A negative result must be combined with clinical  observations, patient history, and epidemiological information. If result is POSITIVE SARS-CoV-2 target nucleic acids are DETECTE D. The SARS-CoV-2 RNA is generally detectable in upper and lower  respiratory specimens during the acute phase of infection.  Positive  results are indicative of active infection with SARS-CoV-2.  Clinical  correlation with patient history and other diagnostic information is  necessary to determine patient infection status.  Positive results do  not rule out bacterial infection or co-infection with other viruses. If result is PRESUMPTIVE POSTIVE SARS-CoV-2 nucleic acids MAY BE PRESENT.   A presumptive positive result was obtained on the submitted specimen  and confirmed on repeat testing.  While 2019 novel coronavirus  (SARS-CoV-2) nucleic acids may be present in the submitted sample  additional confirmatory testing may be necessary for epidemiological  and / or clinical management purposes  to differentiate between  SARS-CoV-2 and other Sarbecovirus currently known to infect humans.  If clinically indicated  additional testing with an alternate test  methodology (LAB745 3) is advised. The SARS-CoV-2 RNA is generally  detectable in upper and lower respiratory specimens during the acute  phase of infection. The expected result is Negative. Fact Sheet for Patients:  StrictlyIdeas.no Fact Sheet for Healthcare Providers: BankingDealers.co.za This test is not yet approved or cleared by the [place] FDA and has been authorized for detection and/or diagnosis of SARS-CoV-2 by FDA under an Emergency Use Authorization (EUA).  This EUA will remain in effect (meaning this test can be used) for the duration of the COVID-19 declaration under Section 564(b)(1) of the Act, 21 U.S.C. section 360bbb-3(b)(1), unless the authorization is terminated or revoked sooner. Performed at [facility], [place] [address] [place], [place] [address]   Culture, Urine     Status: None   Collection Time: 02/12/19  8:40 PM  Result Value Ref Range Status   Specimen Description   Final    Urine Performed at [facility] [address], [place], [place] [address]    Special Requests   Final    NONE Performed at [facility], [place] [address] [place], [place] [address]    Culture   Final    NO GROWTH Performed at [facility], [place] [address] [place], [place] [address]    Report Status 02/14/2019 FINAL  Final  MRSA PCR Screening     Status: None   Collection Time: 02/13/19  9:00 AM  Result Value Ref Range Status   MRSA by PCR NEGATIVE NEGATIVE Final    Comment:        The GeneXpert MRSA Assay (FDA approved for NASAL specimens only), is one component of a comprehensive MRSA colonization surveillance program. It is not intended to diagnose MRSA infection nor to guide or monitor treatment for MRSA infections. Performed at [facility], [place] [address], [place], [place] [address]      Radiology Reports Dg Chest Port 1 View  Result Date: 02/12/2019 CLINICAL DATA:  Shortness of breath EXAM: PORTABLE CHEST 1 VIEW COMPARISON:  Chest CT 08/21/2016 FINDINGS: Large right pleural effusion. Right basilar atelectasis. Cardiomediastinal contours are normal. No acute osseous abnormality. IMPRESSION: Large right pleural effusion with  right basilar atelectasis. Electronically Signed   By: Ulyses Jarred M.D.   On: 02/12/2019 18:12

## 2019-02-14 NOTE — Progress Notes (Signed)
Attempted to call patient's wife, Shauna Hugh.  There was no answer and no voice mail option.  Earleen Reaper RN

## 2019-02-15 LAB — CBC WITH DIFFERENTIAL/PLATELET
Abs Immature Granulocytes: 0.02 10*3/uL (ref 0.00–0.07)
Basophils Absolute: 0 10*3/uL (ref 0.0–0.1)
Basophils Relative: 0 %
Eosinophils Absolute: 0 10*3/uL (ref 0.0–0.5)
Eosinophils Relative: 0 %
HCT: 40.5 % (ref 39.0–52.0)
Hemoglobin: 11 g/dL — ABNORMAL LOW (ref 13.0–17.0)
Immature Granulocytes: 1 %
Lymphocytes Relative: 29 %
Lymphs Abs: 1.2 10*3/uL (ref 0.7–4.0)
MCH: 18.5 pg — ABNORMAL LOW (ref 26.0–34.0)
MCHC: 27.2 g/dL — ABNORMAL LOW (ref 30.0–36.0)
MCV: 68.1 fL — ABNORMAL LOW (ref 80.0–100.0)
Monocytes Absolute: 0.2 10*3/uL (ref 0.1–1.0)
Monocytes Relative: 6 %
Neutro Abs: 2.7 10*3/uL (ref 1.7–7.7)
Neutrophils Relative %: 64 %
Platelets: 140 10*3/uL — ABNORMAL LOW (ref 150–400)
RBC: 5.95 MIL/uL — ABNORMAL HIGH (ref 4.22–5.81)
RDW: 24.2 % — ABNORMAL HIGH (ref 11.5–15.5)
WBC: 4.1 10*3/uL (ref 4.0–10.5)
nRBC: 0 % (ref 0.0–0.2)

## 2019-02-15 LAB — COMPREHENSIVE METABOLIC PANEL
ALT: 19 U/L (ref 0–44)
AST: 29 U/L (ref 15–41)
Albumin: 2.2 g/dL — ABNORMAL LOW (ref 3.5–5.0)
Alkaline Phosphatase: 83 U/L (ref 38–126)
Anion gap: 10 (ref 5–15)
BUN: 18 mg/dL (ref 8–23)
CO2: 29 mmol/L (ref 22–32)
Calcium: 6.9 mg/dL — ABNORMAL LOW (ref 8.9–10.3)
Chloride: 105 mmol/L (ref 98–111)
Creatinine, Ser: 0.87 mg/dL (ref 0.61–1.24)
GFR calc Af Amer: >60 mL/min (ref >60)
GFR calc non Af Amer: >60 mL/min (ref >60)
Glucose, Bld: 114 mg/dL — ABNORMAL HIGH (ref 70–99)
Potassium: 3.4 mmol/L — ABNORMAL LOW (ref 3.5–5.1)
Sodium: 144 mmol/L (ref 135–145)
Total Bilirubin: 0.5 mg/dL (ref 0.3–1.2)
Total Protein: 6.4 g/dL — ABNORMAL LOW (ref 6.5–8.1)

## 2019-02-15 LAB — C-REACTIVE PROTEIN: CRP: 12.9 mg/dL — ABNORMAL HIGH (ref <1.0)

## 2019-02-15 LAB — CULTURE, BLOOD (ROUTINE X 2): Special Requests: ADEQUATE

## 2019-02-15 LAB — FERRITIN: Ferritin: 127 ng/mL (ref 24–336)

## 2019-02-15 LAB — GLUCOSE, CAPILLARY
Glucose-Capillary: 175 mg/dL — ABNORMAL HIGH (ref 70–99)
Glucose-Capillary: 109 mg/dL — ABNORMAL HIGH (ref 70–99)
Glucose-Capillary: 83 mg/dL (ref 70–99)

## 2019-02-15 LAB — INTERLEUKIN-6, PLASMA: Interleukin-6, Plasma: 21.3 pg/mL — ABNORMAL HIGH (ref 0.0–12.2)

## 2019-02-15 MED ORDER — SODIUM CHLORIDE 0.9 % IV SOLN
1.0000 g | INTRAVENOUS | Status: DC
  Administered 2019-02-15: 13:00:00 1 g via INTRAVENOUS
  Filled 2019-02-15: qty 10

## 2019-02-15 MED ORDER — POLYVINYL ALCOHOL 1.4 % OP SOLN
2.0000 [drp] | OPHTHALMIC | Status: DC | PRN
  Filled 2019-02-15: qty 15

## 2019-02-15 MED ORDER — GUAIFENESIN-DM 100-10 MG/5ML PO SYRP
5.0000 mL | ORAL_SOLUTION | ORAL | Status: DC | PRN
  Administered 2019-02-15 – 2019-02-21 (×4): 5 mL via ORAL
  Filled 2019-02-15 (×5): qty 5

## 2019-02-15 NOTE — Progress Notes (Signed)
PROGRESS NOTE                                                                                                                                                                                                             Patient Demographics:    [name], is a 63 y.o. male, DOB - [birth year], HGD:[id number]  Outpatient Primary MD for the patient is Borum, Jaci Standard, MD    LOS - 3  Chief Complaint  Patient presents with  . Shortness of Breath  . Fever       Brief Narrative: Patient is a 63 y.o. male with PMHx of phonic lower extremity wounds, chronic diastolic heart failure, chronic hypoxic respiratory failure on home O2, HTN, DM-2-presented with dyspnea-found to have acute hypoxic respiratory failure in the setting of pleural effusion, decompensated diastolic heart failure and was also found to have COVID-19 infection.   Subjective:    [name] claims that his breathing is better.  He is lying flat in bed.  No major issues overnight.   Assessment  & Plan :    Acute on chronic Hypoxic Resp Failure due to decompensated diastolic heart failure and Covid 19 Viral pneumonia: Continues to slowly improve with supportive care and diuretic therapy.  To slowly downtrend.  Still has some excess volume on board-continue intravenous Lasix, -2.2 L so far.  Continue to follow weights/electrolytes/intake/output.   COVID-19 Labs:  Recent Labs    02/12/19 1653 02/13/19 1015 02/14/19 0525 02/15/19 0500  DDIMER 0.46  --   --   --   FERRITIN 99  --   --  127  LDH 217*  --   --   --   CRP 21.9* 23.2* 14.7* 12.9*    Lab Results  Component Value Date   SARSCOV2NAA POSITIVE (A) 02/12/2019     COVID-19 Medications: None  Right-sided pleural effusion: Suspect this is a transudate in the setting of decompensated heart failure.  Since patient is improving with diuretics-no indications to perform thoracocentesis at this point.   Left lower extremity ulceration: Wound examined by this MD this morning-has some serosanguineous discharge-but no obvious infection, we will narrow antimicrobial therapy down to Rocephin and Flagyl with plans to transition to oral anti-microbial agents in the next few days.  Continue per wound care.   1 out of 2 blood cultures  positive for coag negative staph-likely a contaminant.  Hypokalemia: Replete and recheck  DM-2: CBG stable-continue insulin 70/30 50 units twice daily and SSI.  HTN: Controlled-continue Coreg and lisinopril  Dyslipidemia: Continue statin  Chronic pain syndrome: Stable-continue home regimen of narcotics  Depression: Stable with Cymbalta, Remeron  BPH: Continue Flomax  Chronic debility/deconditioning: Worsened by acute illness-Per patient he is mostly bed to wheelchair bound.  Claims he has not ambulated independently in years.  Morbid obesity    ABG:    Component Value Date/Time   PHART 7.304 (L) 02/12/2019 2050   PCO2ART 66.9 ([place]) 02/12/2019 2050   PO2ART 73.1 (L) 02/12/2019 2050   HCO3 32.2 (H) 02/12/2019 2050   TCO2 21 11/02/2015 2039   ACIDBASEDEF 6.0 (H) 11/02/2015 2039   O2SAT 90.2 02/12/2019 2050    Condition -gaurded  Family Communication  : Called patient's brother-left a voicemail  Code Status :  Full Code  Diet : Heart healthy/diabetic  Disposition Plan  :  Remain inpatient-hopefully back to SNF in the next few days  Consults  : None  Procedures  :  None  DVT Prophylaxis  :  Lovenox   Lab Results  Component Value Date   PLT 140 (L) 02/15/2019    Inpatient Medications  Scheduled Meds: . amitriptyline  100 mg Oral QHS  . aspirin EC  81 mg Oral BH-q7a  . atorvastatin  80 mg Oral Daily  . carvedilol  3.125 mg Oral BID WC  . DULoxetine  40 mg Oral Daily  . enoxaparin (LOVENOX) injection  60 mg Subcutaneous Q24H  . feeding supplement (ENSURE ENLIVE)  237 mL Oral Q24H  . feeding supplement (PRO-STAT SUGAR FREE 64)  30 mL Oral  TID WC  . furosemide  40 mg Intravenous Q12H  . insulin aspart  0-15 Units Subcutaneous Q4H  . insulin aspart protamine- aspart  50 Units Subcutaneous BID WC  . mirtazapine  15 mg Oral QHS  . nutrition supplement (JUVEN)  1 packet Oral BID BM  . oxyCODONE  5 mg Oral Q6H  . pantoprazole  40 mg Oral Daily  . tamsulosin  0.4 mg Oral QPC supper   Continuous Infusions: . sodium chloride 10 mL/hr at 02/14/19 1859  . ceFEPime (MAXIPIME) IV 2 g (02/15/19 0622)  . metronidazole 500 mg (02/15/19 0440)  . vancomycin 1,000 mg (02/15/19 1009)   PRN Meds:.sodium chloride, acetaminophen, albuterol, guaiFENesin-dextromethorphan, oxyCODONE, polyethylene glycol, polyvinyl alcohol, tiZANidine  Antibiotics  :    Anti-infectives (From admission, onward)   Start     Dose/Rate Route Frequency Ordered Stop   02/13/19 1000  vancomycin (VANCOCIN) IVPB 1000 mg/200 mL premix     1,000 mg 200 mL/hr over 60 Minutes Intravenous Every 12 hours 02/12/19 2111     02/13/19 0600  ceFEPIme (MAXIPIME) 2 g in sodium chloride 0.9 % 100 mL IVPB     2 g 200 mL/hr over 30 Minutes Intravenous Every 8 hours 02/12/19 2111     02/12/19 2115  ceFEPIme (MAXIPIME) 2 g in sodium chloride 0.9 % 100 mL IVPB     2 g 200 mL/hr over 30 Minutes Intravenous  Once 02/12/19 2102 02/12/19 2155   02/12/19 2115  vancomycin (VANCOCIN) 2,500 mg in sodium chloride 0.9 % 500 mL IVPB     2,500 mg 250 mL/hr over 120 Minutes Intravenous  Once 02/12/19 2111 02/13/19 0010   02/12/19 2100  metroNIDAZOLE (FLAGYL) IVPB 500 mg     500 mg 100 mL/hr over 60 Minutes  Intravenous Every 8 hours 02/12/19 2046         Time Spent in minutes  25   Oren Binet M.D on 02/15/2019 at 12:04 PM  To page go to [email or web address] - use universal password  [facility] -  Office  [phone]  Admit date - 02/12/2019    3    Objective:   Vitals:   02/15/19 0013 02/15/19 0440 02/15/19 0746 02/15/19 0928  BP:    (!) 157/79  Pulse:    98  Resp:       Temp: 97.7 F (36.5 C) 100.2 F (37.9 C) 100.2 F (37.9 C)   TempSrc: Oral Oral Oral   SpO2:      Weight:  113.7 kg    Height:        Wt Readings from Last 3 Encounters:  02/15/19 113.7 kg  11/28/17 123.7 kg  11/03/17 120.7 kg     Intake/Output Summary (Last 24 hours) at 02/15/2019 1204 Last data filed at 02/15/2019 0830 Gross per 24 hour  Intake 1418 ml  Output 2125 ml  Net -707 ml     Physical Exam General appearance:Awake, alert, not in any distress.  Eyes:no scleral icterus. HEENT: Atraumatic and Normocephalic Neck: supple Resp:Good air entry bilaterally, few bibasilar rales CVS: S1 S2 regular, no murmurs.  GI: Bowel sounds present, Non tender and not distended with no gaurding, rigidity or rebound. Extremities: B/L Lower Ext shows + edema, both legs are warm to touch Neurology:  Non focal-but has generalized weakness Musculoskeletal:No digital cyanosis Skin:No Rash, warm and dry Wounds:N/A   Data Review:    CBC Recent Labs  Lab 02/12/19 1653 02/13/19 1015 02/14/19 0525 02/15/19 0500  WBC 5.5 5.4 4.8 4.1  HGB 11.1* 12.3* 11.6* 11.0*  HCT 39.0 45.6 40.8 40.5  PLT 155 177 148* 140*  MCV 66.7* 68.4* 67.1* 68.1*  MCH 19.0* 18.4* 19.1* 18.5*  MCHC 28.5* 27.0* 28.4* 27.2*  RDW 24.1* 25.2* 24.4* 24.2*  LYMPHSABS 1.4 1.0 1.0 1.2  MONOABS 0.3 0.3 0.3 0.2  EOSABS 0.0 0.0 0.0 0.0  BASOSABS 0.0 0.0 0.0 0.0    Chemistries  Recent Labs  Lab 02/12/19 1653 02/13/19 1015 02/14/19 0525 02/15/19 0500  NA 139 142 141 144  K 3.9 4.2 3.9 3.4*  CL 100 97* 99 105  CO2 30 34* 34* 29  GLUCOSE 194* 338* 129* 114*  BUN 29* 29* 26* 18  CREATININE 1.12 1.18 0.94 0.87  CALCIUM 7.8* 7.9* 7.8* 6.9*  AST 38 38 31 29  ALT 21 25 20 19   ALKPHOS 120 125 105 83  BILITOT 0.2* 0.2* 0.5 0.5   ------------------------------------------------------------------------------------------------------------------ Recent Labs    02/12/19 1653  TRIG 136    Lab Results   Component Value Date   HGBA1C 8.0 (H) 02/13/2019   ------------------------------------------------------------------------------------------------------------------ No results for input(s): TSH, T4TOTAL, T3FREE, THYROIDAB in the last 72 hours.  Invalid input(s): FREET3 ------------------------------------------------------------------------------------------------------------------ Recent Labs    02/12/19 1653 02/15/19 0500  FERRITIN 99 127    Coagulation profile No results for input(s): INR, PROTIME in the last 168 hours.  Recent Labs    02/12/19 1653  DDIMER 0.46    Cardiac Enzymes No results for input(s): CKMB, TROPONINI, MYOGLOBIN in the last 168 hours.  Invalid input(s): CK ------------------------------------------------------------------------------------------------------------------    Component Value Date/Time   BNP 34.2 02/12/2019 1653    Micro Results Recent Results (from the past 240 hour(s))  Blood Culture (routine x 2)     Status: None (Preliminary  result)   Collection Time: 02/12/19  4:24 PM  Result Value Ref Range Status   Specimen Description   Final    BLOOD LEFT FOREARM Performed at [facility] [address], [place], [place] [address]    Special Requests   Final    BOTTLES DRAWN AEROBIC AND ANAEROBIC Blood Culture adequate volume Performed at [facility] [address] [place], [place] [address]    Culture   Final    NO GROWTH 3 DAYS Performed at [facility], [place] [address], [place], [place] [address]    Report Status PENDING  Incomplete  Blood Culture (routine x 2)     Status: Abnormal   Collection Time: 02/12/19  4:53 PM  Result Value Ref Range Status   Specimen Description   Final    BLOOD RIGHT ANTECUBITAL Performed at [facility] [address], [place], [place] [address]    Special Requests   Final    BOTTLES DRAWN AEROBIC AND ANAEROBIC Blood Culture  adequate volume Performed at [facility] [address] [place], [place] [address]    Culture  Setup Time   Final    GRAM POSITIVE COCCI IN CLUSTERS ANAEROBIC BOTTLE ONLY CRITICAL RESULT CALLED TO, READ BACK BY AND VERIFIED WITH: C. SHADE, PHARMD ([place]) AT 1410 ON 02/13/19 BY C. JESSUP, MLT.    Culture (A)  Final    STAPHYLOCOCCUS SPECIES (COAGULASE NEGATIVE) THE SIGNIFICANCE OF ISOLATING THIS ORGANISM FROM A SINGLE SET OF BLOOD CULTURES WHEN MULTIPLE SETS ARE DRAWN IS UNCERTAIN. PLEASE NOTIFY THE MICROBIOLOGY DEPARTMENT WITHIN ONE WEEK IF SPECIATION AND SENSITIVITIES ARE REQUIRED. Performed at [facility], [place] [address], [place], [place] [address]    Report Status 02/15/2019 FINAL  Final  Blood Culture ID Panel (Reflexed)     Status: Abnormal   Collection Time: 02/12/19  4:53 PM  Result Value Ref Range Status   Enterococcus species NOT DETECTED NOT DETECTED Final   Listeria monocytogenes NOT DETECTED NOT DETECTED Final   Staphylococcus species DETECTED (A) NOT DETECTED Final    Comment: Methicillin (oxacillin) resistant coagulase negative staphylococcus. Possible blood culture contaminant (unless isolated from more than one blood culture draw or clinical case suggests pathogenicity). No antibiotic treatment is indicated for blood  culture contaminants. CRITICAL RESULT CALLED TO, READ BACK BY AND VERIFIED WITH: C. SHADE, PHARMD ([place]) AT 1410 ON 02/13/19 BY C. JESSUP, MLT.    Staphylococcus aureus (BCID) NOT DETECTED NOT DETECTED Final   Methicillin resistance DETECTED (A) NOT DETECTED Final    Comment: CRITICAL RESULT CALLED TO, READ BACK BY AND VERIFIED WITH: C. SHADE, PHARMD ([place]) AT 1410 ON 02/13/19 BY C. JESSUP, MLT.    Streptococcus species NOT DETECTED NOT DETECTED Final   Streptococcus agalactiae NOT DETECTED NOT DETECTED Final   Streptococcus pneumoniae NOT DETECTED NOT DETECTED Final   Streptococcus pyogenes NOT DETECTED NOT DETECTED Final    Acinetobacter baumannii NOT DETECTED NOT DETECTED Final   Enterobacteriaceae species NOT DETECTED NOT DETECTED Final   Enterobacter cloacae complex NOT DETECTED NOT DETECTED Final   Escherichia coli NOT DETECTED NOT DETECTED Final   Klebsiella oxytoca NOT DETECTED NOT DETECTED Final   Klebsiella pneumoniae NOT DETECTED NOT DETECTED Final   Proteus species NOT DETECTED NOT DETECTED Final   Serratia marcescens NOT DETECTED NOT DETECTED Final   Haemophilus influenzae NOT DETECTED NOT DETECTED Final   Neisseria meningitidis NOT DETECTED NOT DETECTED Final   Pseudomonas aeruginosa  NOT DETECTED NOT DETECTED Final   Candida albicans NOT DETECTED NOT DETECTED Final   Candida glabrata NOT DETECTED NOT DETECTED Final   Candida krusei NOT DETECTED NOT DETECTED Final   Candida parapsilosis NOT DETECTED NOT DETECTED Final   Candida tropicalis NOT DETECTED NOT DETECTED Final    Comment: Performed at [facility], [place] [address], [place], [place] [address]  SARS Coronavirus 2 (CEPHEID- Performed in [facility] hospital lab), Hosp Order     Status: Abnormal   Collection Time: 02/12/19  7:18 PM  Result Value Ref Range Status   SARS Coronavirus 2 POSITIVE (A) NEGATIVE Final    Comment: RESULT CALLED TO, READ BACK BY AND VERIFIED WITH: O.IDOWU AT 2042 ON 02/12/19 BY N.THOMPSON (NOTE) If result is NEGATIVE SARS-CoV-2 target nucleic acids are NOT DETECTED. The SARS-CoV-2 RNA is generally detectable in upper and lower  respiratory specimens during the acute phase of infection. The lowest  concentration of SARS-CoV-2 viral copies this assay can detect is 250  copies / mL. A negative result does not preclude SARS-CoV-2 infection  and should not be used as the sole basis for treatment or other  patient management decisions.  A negative result may occur with  improper specimen collection / handling, submission of specimen other  than nasopharyngeal swab, presence of viral mutation(s) within the  areas  targeted by this assay, and inadequate number of viral copies  (<250 copies / mL). A negative result must be combined with clinical  observations, patient history, and epidemiological information. If result is POSITIVE SARS-CoV-2 target nucleic acids are DETECTE D. The SARS-CoV-2 RNA is generally detectable in upper and lower  respiratory specimens during the acute phase of infection.  Positive  results are indicative of active infection with SARS-CoV-2.  Clinical  correlation with patient history and other diagnostic information is  necessary to determine patient infection status.  Positive results do  not rule out bacterial infection or co-infection with other viruses. If result is PRESUMPTIVE POSTIVE SARS-CoV-2 nucleic acids MAY BE PRESENT.   A presumptive positive result was obtained on the submitted specimen  and confirmed on repeat testing.  While 2019 novel coronavirus  (SARS-CoV-2) nucleic acids may be present in the submitted sample  additional confirmatory testing may be necessary for epidemiological  and / or clinical management purposes  to differentiate between  SARS-CoV-2 and other Sarbecovirus currently known to infect humans.  If clinically indicated additional testing with an alternate test  methodology (LAB745 3) is advised. The SARS-CoV-2 RNA is generally  detectable in upper and lower respiratory specimens during the acute  phase of infection. The expected result is Negative. Fact Sheet for Patients:  StrictlyIdeas.no Fact Sheet for Healthcare Providers: BankingDealers.co.za This test is not yet approved or cleared by the [place] FDA and has been authorized for detection and/or diagnosis of SARS-CoV-2 by FDA under an Emergency Use Authorization (EUA).  This EUA will remain in effect (meaning this test can be used) for the duration of the COVID-19 declaration under Section 564(b)(1) of the Act, 21 U.S.C. section  360bbb-3(b)(1), unless the authorization is terminated or revoked sooner. Performed at [facility], [place] [address], [place], [place] [address]   Culture, Urine     Status: None   Collection Time: 02/12/19  8:40 PM  Result Value Ref Range Status   Specimen Description   Final    Urine Performed at [facility] [address], [place], [place] [address]  Special Requests   Final    NONE Performed at [facility], [place] [address], [place], [place] [address]    Culture   Final    NO GROWTH Performed at [facility], [place] [address] [place], [place] [address]    Report Status 02/14/2019 FINAL  Final  MRSA PCR Screening     Status: None   Collection Time: 02/13/19  9:00 AM  Result Value Ref Range Status   MRSA by PCR NEGATIVE NEGATIVE Final    Comment:        The GeneXpert MRSA Assay (FDA approved for NASAL specimens only), is one component of a comprehensive MRSA colonization surveillance program. It is not intended to diagnose MRSA infection nor to guide or monitor treatment for MRSA infections. Performed at [facility], [place] [address] [place] [address], [place],  [address]     Radiology Reports Dg Chest Port 1 View  Result Date: 02/12/2019 CLINICAL DATA:  Shortness of breath EXAM: PORTABLE CHEST 1 VIEW COMPARISON:  Chest CT 08/21/2016 FINDINGS: Large right pleural effusion. Right basilar atelectasis. Cardiomediastinal contours are normal. No acute osseous abnormality. IMPRESSION: Large right pleural effusion with right basilar atelectasis. Electronically Signed   By: Ulyses Jarred M.D.   On: 02/12/2019 18:12

## 2019-02-15 NOTE — Evaluation (Signed)
Physical Therapy Evaluation Patient Details Name: [name] MRN: [id number] DOB: 01-26-1956 Today's Date: 02/15/2019   History of Present Illness  Patient is a 63 y.o. male with PMHx of chronic lower extremity wounds, chronic diastolic heart failure, chronic hypoxic respiratory failure on home O2, HTN, DM-2-pesented 02/12/19 for SNF  with dyspnea-found to have acute hypoxic respiratory failure in the setting of pleural effusion, decompensated diastolic heart failure and was also found to have COVID-19 infection  Clinical Impression  The patient presents with significant stiffness of both LE's. The patient relates that he gets up and ambulates while pushing A WC at the SNF when " having a good day." Today, patient required +2 total assist for rolling. Did not attempt to sit up as patient's SaO2 remained in 82-87 % range on 4 L.. RN notified. Pt admitted with above diagnosis. Pt currently with functional limitations due to the deficits listed below (see PT Problem List).  Pt will benefit from skilled PT to increase their independence and safety with mobility to allow discharge to the venue listed below.       Follow Up Recommendations SNF    Equipment Recommendations  None recommended by PT    Recommendations for Other Services       Precautions / Restrictions Precautions Precautions: Fall Precaution Comments: SaoO2 low 80's on 4 L. mnitor, incontinennt of B/B on eval      Mobility  Bed Mobility Overal bed mobility: Needs Assistance Bed Mobility: Rolling Rolling: Total assist;+2 for physical assistance;+2 for safety/equipment         General bed mobility comments: total assist to roll to each side for hygiene/bath, patient does reach for rail. patient does not assist the legs   to move to reposition, very tight  in abduction   Transfers                 General transfer comment: NT  Ambulation/Gait                Stairs            Wheelchair  Mobility    Modified Rankin (Stroke Patients Only)       Balance                                             Pertinent Vitals/Pain Pain Assessment: Faces Faces Pain Scale: Hurts even more Pain Location: both legs when boved to repostion Pain Intervention(s): Limited activity within patient's tolerance;Monitored during session    Home Living Family/patient expects to be discharged to:: Skilled nursing facility                      Prior Function Level of Independence: Needs assistance   Gait / Transfers Assistance Needed: pt reports that he gets himself up on most days, walks behind WC for mobility           Hand Dominance        Extremity/Trunk Assessment   Upper Extremity Assessment Upper Extremity Assessment: Generalized weakness    Lower Extremity Assessment Lower Extremity Assessment: RLE deficits/detail;LLE deficits/detail RLE Deficits / Details: noted dressings intact, legs are very stiff, barely flex at the knees and hips for mobility and hygiene    Cervical / Trunk Assessment Cervical / Trunk Assessment: Other exceptions Cervical / Trunk Exceptions: NT  Communication   Communication: No  difficulties  Cognition Arousal/Alertness: Awake/alert Behavior During Therapy: WFL for tasks assessed/performed Overall Cognitive Status: Difficult to assess                                 General Comments: patient reports that he is ambul;atory but his legs are so stiff, difficult to image that he does. will assess.      General Comments      Exercises     Assessment/Plan    PT Assessment Patient needs continued PT services  PT Problem List Decreased strength;Decreased range of motion;Decreased activity tolerance;Decreased knowledge of use of DME;Decreased balance;Decreased mobility;Decreased knowledge of precautions;Decreased skin integrity;Cardiopulmonary status limiting activity;Impaired tone       PT  Treatment Interventions DME instruction;Gait training;Functional mobility training;Therapeutic activities;Patient/family education;Therapeutic exercise    PT Goals (Current goals can be found in the Care Plan section)  Acute Rehab PT Goals Patient Stated Goal: to get up PT Goal Formulation: With patient Time For Goal Achievement: 03/01/19 Potential to Achieve Goals: Fair    Frequency Min 2X/week   Barriers to discharge        Co-evaluation               AM-PAC PT "6 Clicks" Mobility  Outcome Measure Help needed turning from your back to your side while in a flat bed without using bedrails?: Total Help needed moving from lying on your back to sitting on the side of a flat bed without using bedrails?: Total Help needed moving to and from a bed to a chair (including a wheelchair)?: Total Help needed standing up from a chair using your arms (e.g., wheelchair or bedside chair)?: Total Help needed to walk in hospital room?: Total Help needed climbing 3-5 steps with a railing? : Total 6 Click Score: 6    End of Session   Activity Tolerance: Treatment limited secondary to medical complications (Comment) Patient left: in bed;with call bell/phone within reach;with bed alarm set Nurse Communication: Mobility status PT Visit Diagnosis: Unsteadiness on feet (R26.81);Muscle weakness (generalized) (M62.81)    Time: 4709-6283 PT Time Calculation (min) (ACUTE ONLY): 52 min   Charges:   PT Evaluation $PT Eval Moderate Complexity: 1 Mod PT Treatments $Therapeutic Activity: 23-37 mins        [name] PT Acute Rehabilitation Services Pager [phone] Office [phone]   [name] 02/15/2019, 2:28 PM

## 2019-02-16 ENCOUNTER — Inpatient Hospital Stay (HOSPITAL_COMMUNITY): Payer: Medicare Other

## 2019-02-16 DIAGNOSIS — E11649 Type 2 diabetes mellitus with hypoglycemia without coma: Secondary | ICD-10-CM

## 2019-02-16 DIAGNOSIS — I5041 Acute combined systolic (congestive) and diastolic (congestive) heart failure: Secondary | ICD-10-CM

## 2019-02-16 DIAGNOSIS — R0902 Hypoxemia: Secondary | ICD-10-CM

## 2019-02-16 LAB — COMPREHENSIVE METABOLIC PANEL
ALT: 21 U/L (ref 0–44)
AST: 36 U/L (ref 15–41)
Albumin: 2.4 g/dL — ABNORMAL LOW (ref 3.5–5.0)
Alkaline Phosphatase: 101 U/L (ref 38–126)
Anion gap: 11 (ref 5–15)
BUN: 22 mg/dL (ref 8–23)
CO2: 36 mmol/L — ABNORMAL HIGH (ref 22–32)
Calcium: 8.3 mg/dL — ABNORMAL LOW (ref 8.9–10.3)
Chloride: 95 mmol/L — ABNORMAL LOW (ref 98–111)
Creatinine, Ser: 0.99 mg/dL (ref 0.61–1.24)
GFR calc Af Amer: >60 mL/min (ref >60)
GFR calc non Af Amer: >60 mL/min (ref >60)
Glucose, Bld: 131 mg/dL — ABNORMAL HIGH (ref 70–99)
Potassium: 3.5 mmol/L (ref 3.5–5.1)
Sodium: 142 mmol/L (ref 135–145)
Total Bilirubin: 0.4 mg/dL (ref 0.3–1.2)
Total Protein: 7.5 g/dL (ref 6.5–8.1)

## 2019-02-16 LAB — CBC WITH DIFFERENTIAL/PLATELET
Abs Immature Granulocytes: 0.02 10*3/uL (ref 0.00–0.07)
Basophils Absolute: 0 10*3/uL (ref 0.0–0.1)
Basophils Relative: 0 %
Eosinophils Absolute: 0 10*3/uL (ref 0.0–0.5)
Eosinophils Relative: 0 %
HCT: 44.7 % (ref 39.0–52.0)
Hemoglobin: 11.9 g/dL — ABNORMAL LOW (ref 13.0–17.0)
Immature Granulocytes: 1 %
Lymphocytes Relative: 30 %
Lymphs Abs: 1.2 10*3/uL (ref 0.7–4.0)
MCH: 18.3 pg — ABNORMAL LOW (ref 26.0–34.0)
MCHC: 26.6 g/dL — ABNORMAL LOW (ref 30.0–36.0)
MCV: 68.8 fL — ABNORMAL LOW (ref 80.0–100.0)
Monocytes Absolute: 0.4 10*3/uL (ref 0.1–1.0)
Monocytes Relative: 9 %
Neutro Abs: 2.4 10*3/uL (ref 1.7–7.7)
Neutrophils Relative %: 60 %
Platelets: 150 10*3/uL (ref 150–400)
RBC: 6.5 MIL/uL — ABNORMAL HIGH (ref 4.22–5.81)
RDW: 24.8 % — ABNORMAL HIGH (ref 11.5–15.5)
WBC: 4 10*3/uL (ref 4.0–10.5)
nRBC: 0 % (ref 0.0–0.2)

## 2019-02-16 LAB — GLUCOSE, CAPILLARY
Glucose-Capillary: 106 mg/dL — ABNORMAL HIGH (ref 70–99)
Glucose-Capillary: 111 mg/dL — ABNORMAL HIGH (ref 70–99)
Glucose-Capillary: 116 mg/dL — ABNORMAL HIGH (ref 70–99)
Glucose-Capillary: 117 mg/dL — ABNORMAL HIGH (ref 70–99)
Glucose-Capillary: 119 mg/dL — ABNORMAL HIGH (ref 70–99)
Glucose-Capillary: 133 mg/dL — ABNORMAL HIGH (ref 70–99)
Glucose-Capillary: 138 mg/dL — ABNORMAL HIGH (ref 70–99)
Glucose-Capillary: 138 mg/dL — ABNORMAL HIGH (ref 70–99)
Glucose-Capillary: 142 mg/dL — ABNORMAL HIGH (ref 70–99)
Glucose-Capillary: 156 mg/dL — ABNORMAL HIGH (ref 70–99)
Glucose-Capillary: 172 mg/dL — ABNORMAL HIGH (ref 70–99)
Glucose-Capillary: 221 mg/dL — ABNORMAL HIGH (ref 70–99)
Glucose-Capillary: 274 mg/dL — ABNORMAL HIGH (ref 70–99)
Glucose-Capillary: 32 mg/dL — CL (ref 70–99)

## 2019-02-16 LAB — BRAIN NATRIURETIC PEPTIDE: B Natriuretic Peptide: 18.4 pg/mL (ref 0.0–100.0)

## 2019-02-16 LAB — PROCALCITONIN: Procalcitonin: 0.14 ng/mL

## 2019-02-16 LAB — C-REACTIVE PROTEIN: CRP: 21.9 mg/dL — ABNORMAL HIGH (ref <1.0)

## 2019-02-16 MED ORDER — SODIUM CHLORIDE 0.9 % IV SOLN
100.0000 mg | INTRAVENOUS | Status: AC
  Administered 2019-02-17 – 2019-02-20 (×4): 100 mg via INTRAVENOUS
  Filled 2019-02-16 (×4): qty 20

## 2019-02-16 MED ORDER — INSULIN ASPART PROT & ASPART (70-30 MIX) 100 UNIT/ML ~~LOC~~ SUSP
42.0000 [IU] | Freq: Every day | SUBCUTANEOUS | Status: DC
  Filled 2019-02-16: qty 10

## 2019-02-16 MED ORDER — INSULIN ASPART PROT & ASPART (70-30 MIX) 100 UNIT/ML ~~LOC~~ SUSP
35.0000 [IU] | Freq: Every day | SUBCUTANEOUS | Status: DC
  Filled 2019-02-16: qty 10

## 2019-02-16 MED ORDER — ENOXAPARIN SODIUM 60 MG/0.6ML ~~LOC~~ SOLN
60.0000 mg | Freq: Two times a day (BID) | SUBCUTANEOUS | Status: DC
  Administered 2019-02-16 – 2019-02-22 (×13): 60 mg via SUBCUTANEOUS
  Filled 2019-02-16 (×15): qty 0.6

## 2019-02-16 MED ORDER — OXYCODONE HCL ER 10 MG PO T12A
10.0000 mg | EXTENDED_RELEASE_TABLET | Freq: Two times a day (BID) | ORAL | Status: DC
  Administered 2019-02-16 – 2019-02-22 (×12): 10 mg via ORAL
  Filled 2019-02-16 (×12): qty 1

## 2019-02-16 MED ORDER — IOHEXOL 350 MG/ML SOLN
100.0000 mL | Freq: Once | INTRAVENOUS | Status: AC | PRN
  Administered 2019-02-16: 11:00:00 100 mL via INTRAVENOUS

## 2019-02-16 MED ORDER — SODIUM CHLORIDE 0.9 % IV SOLN
200.0000 mg | Freq: Once | INTRAVENOUS | Status: AC
  Administered 2019-02-16: 10:00:00 200 mg via INTRAVENOUS
  Filled 2019-02-16: qty 40

## 2019-02-16 MED ORDER — DEXTROSE 50 % IV SOLN
1.0000 | INTRAVENOUS | Status: DC | PRN
  Administered 2019-02-16: 01:00:00 50 mL via INTRAVENOUS
  Filled 2019-02-16: qty 50

## 2019-02-16 MED ORDER — PNEUMOCOCCAL VAC POLYVALENT 25 MCG/0.5ML IJ INJ
0.5000 mL | INJECTION | INTRAMUSCULAR | Status: AC
  Administered 2019-02-17: 12:00:00 0.5 mL via INTRAMUSCULAR
  Filled 2019-02-16 (×2): qty 0.5

## 2019-02-16 MED ORDER — SODIUM CHLORIDE 0.9 % IV SOLN
2.0000 g | INTRAVENOUS | Status: DC
  Administered 2019-02-16: 14:00:00 2 g via INTRAVENOUS
  Filled 2019-02-16: qty 20
  Filled 2019-02-16: qty 2

## 2019-02-16 NOTE — Progress Notes (Signed)
We got stock for Oxycontin here now so we will change his schedule of oxycodone IR to the extended release version that he is on at home.   Onnie Boer, PharmD, BCIDP, AAHIVP, CPP Infectious Disease Pharmacist 02/16/2019 2:54 PM

## 2019-02-16 NOTE — Progress Notes (Addendum)
Physical Therapy Treatment Patient Details Name: [name] MRN: [id number] DOB: [birth year] Today's Date: 02/16/2019    History of Present Illness Patient is a 63 y.o. male with PMHx of chronic lower extremity wounds, chronic diastolic heart failure, chronic hypoxic respiratory failure on home O2, HTN, DM-2-pesented 02/12/19 for SNF  with dyspnea-found to have acute hypoxic respiratory failure in the setting of pleural effusion, decompensated diastolic heart failure and was also found to have COVID-19 infection    PT Comments    Pt pleased he was able to sit EOB today with min assist. Transition was effortful but VSS, required +2 assist to return to supine. Pt will benefit from continued PT in acute setting. He reports independence with bed to w/c transfers at baseline.  Follow Up Recommendations  SNF     Equipment Recommendations  None recommended by PT    Recommendations for Other Services       Precautions / Restrictions Precautions Precautions: Fall Precaution Comments: incontinent of bowel and bladder; extremely limited L hip flexion at baseline Restrictions Weight Bearing Restrictions: No    Mobility  Bed Mobility Overal bed mobility: Needs Assistance Bed Mobility: Rolling;Supine to Sit;Sit to Supine Rolling: Max assist   Supine to sit: Min assist;HOB elevated Sit to supine: Max assist;+2 for physical assistance;+2 for safety/equipment   General bed mobility comments: max assist to roll, heavy assist with LEs, able to assist with UB/rail; pt requires excessive time but able to perform supine to sit mostly of his own volition.  requires  assist with trunk and LEs to return to supine  Transfers                 General transfer comment: NT/unable d/t limited L hip mobility/pain/incontinence   Ambulation/Gait             General Gait Details: non-amb at baseline   Stairs             Wheelchair Mobility    Modified Rankin (Stroke Patients  Only)       Balance      sat EOB x ~ 14 mins, R lateral lean d/t decr ROM L hip and pain, able to correct briefly by holding foot board. 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    General Comments        Pertinent Vitals/Pain Pain Assessment: Faces Faces Pain Scale: Hurts little more Pain Location: LEs with movement Pain Descriptors / Indicators: Discomfort;Grimacing;Guarding Pain Intervention(s): Limited activity within patient's tolerance;Monitored during session;Repositioned    Home Living                      Prior Function            PT Goals (current goals can now be found in the care  plan section) Acute Rehab PT Goals Patient Stated Goal: to get up PT Goal Formulation: With patient Time For Goal Achievement: 03/01/19 Potential to Achieve Goals: Fair Progress towards PT goals: Progressing toward goals    Frequency    Min 2X/week      PT Plan Current plan remains appropriate    Co-evaluation              AM-PAC PT "6 Clicks" Mobility   Outcome Measure  Help needed turning from your back to your side while in a flat bed without using bedrails?: A Lot Help needed moving from lying on your back to sitting on the side of a flat bed without using bedrails?: A Lot Help needed moving to and from a bed to a chair (including a wheelchair)?: Total Help needed standing up from a chair using your arms (e.g., wheelchair or bedside chair)?: Total Help needed  to walk in hospital room?: Total Help needed climbing 3-5 steps with a railing? : Total 6 Click Score: 8    End of Session   Activity Tolerance: Patient tolerated treatment well;Patient limited by fatigue Patient left: in bed;with call bell/phone within reach Nurse Communication: Mobility status PT Visit Diagnosis: Unsteadiness on feet (R26.81);Muscle weakness (generalized) (M62.81)     Time: 1415-1450 PT Time Calculation (min) (ACUTE ONLY): 35 min  Charges:  $Therapeutic Activity: 23-37 mins                     Kenyon Ana, PT  Pager: [phone] Acute Rehab Dept [facility]): [phone]   02/16/2019    [facility] 02/16/2019, 3:04 PM

## 2019-02-16 NOTE — Progress Notes (Signed)
Inpatient Diabetes Program Recommendations  AACE/ADA: New Consensus Statement on Inpatient Glycemic Control (2015)  Target Ranges:  Prepandial:   less than 140 mg/dL      Peak postprandial:   less than 180 mg/dL (1-2 hours)      Critically ill patients:  140 - 180 mg/dL     Review of Glycemic Control  Diabetes history: DM 2 Outpatient Diabetes medications: 70/30 50 units BID, Novolog 2-10 units tid  Current orders for Inpatient glycemic control:  70/30 50 units BID, Novolog 0-15 units Q4 hours  A1c 8% on 5/23  Inpatient Diabetes Program Recommendations:    Hypoglycemia 32 around midnight last night.  Please consider reducing 70/30 to 45 units qam, 35 units qpm.  Thanks,  Tama Headings RN, MSN, BC-ADM Inpatient Diabetes Coordinator Team Pager [phone] (8a-5p)

## 2019-02-16 NOTE — Progress Notes (Signed)
PROGRESS NOTE                                                                                                                                                                                                             Patient Demographics:    [name], is a 63 y.o. male, DOB - Oct 07, 1955, JKK:[id number]  Outpatient Primary MD for the patient is Borum, Jaci Standard, MD    LOS - 4  Chief Complaint  Patient presents with   Shortness of Breath   Fever       Brief Narrative: Patient is a 63 y.o. male with PMHx of phonic lower extremity wounds, chronic diastolic heart failure, chronic hypoxic respiratory failure on home O2, HTN, DM-2-presented with dyspnea-found to have acute hypoxic respiratory failure in the setting of pleural effusion, decompensated diastolic heart failure and was also found to have COVID-19 infection.   Subjective:    [name] has developed worsening hypoxia overnight-requiring initiation of high flow oxygen-currently on 10 L of oxygen.  However he appears stable and is not in any sort of distress.  Easily talking in full sentences   Assessment  & Plan :    Acute on chronic Hypoxic Resp Failure due to decompensated diastolic heart failure and Covid 19 Viral pneumonia: Was doing well on intravenous Lasix and other supportive care-unfortunately oxygen requirements have dramatically worsened this morning.  CT angio chest does not show  PE and confirms bilateral groundglass opacities (right> left lung).  Inflammatory markers have also worsened (but has chronic lower extremity wounds).  Start Remdesivir-has chronic lower extremity wounds with question of infection on admission-hence would like to avoid immunomodulating agents as much as possible.  Will check procalcitonin to make sure not significantly elevated but is already on empiric antimicrobial therapy for lower extremity wounds.  Volume status has  improved-significant decrease in lower extremity edema-weight decreased to 250 pounds (268 pounds on admission), -3.3 L so far. Continue Lasix as long as renal function can tolerate. Continue to follow weights/electrolytes/intake/output.   COVID-19 Labs:  Recent Labs    02/14/19 0525 02/15/19 0500 02/16/19 0409  FERRITIN  --  127  --   CRP 14.7* 12.9* 21.9*    Lab Results  Component Value Date   SARSCOV2NAA POSITIVE (A) 02/12/2019     COVID-19 Medications:  5/26>> Remdesivir  Right-sided  pleural effusion: Suspect this is a transudate in the setting of decompensated heart failure.  Due to worsening hypoxia-CT chest done today which does not really show a large enough pleural effusion that needs a thoracocentesis.  Continue supportive care.  Left lower extremity ulceration: No evidence of wound infection on my exam on 5/25-we will continue with Rocephin and Flagyl for another few days before transitioning to oral agents. Continue per wound care.   1 out of 2 blood cultures positive for coag negative staph-likely a contaminant.  Hypokalemia: Continue replete and recheck  DM-2: Hypoglycemic episode late last night-decrease insulin 70/30 to 40 units in the morning and 35 units in the evening.  Follow.   HTN: Controlled-continue Coreg and lisinopril  Dyslipidemia: Continue statin  Chronic pain syndrome: Stable-continue home regimen of narcotics  Depression: Stable with Cymbalta, Remeron  BPH: Continue Flomax  Chronic debility/deconditioning: Worsened by acute illness-Per patient he is mostly bed to wheelchair bound.  Claims he has not ambulated independently in years.  Morbid obesity    ABG:    Component Value Date/Time   PHART 7.304 (L) 02/12/2019 2050   PCO2ART 66.9 (HH) 02/12/2019 2050   PO2ART 73.1 (L) 02/12/2019 2050   HCO3 32.2 (H) 02/12/2019 2050   TCO2 21 11/02/2015 2039   ACIDBASEDEF 6.0 (H) 11/02/2015 2039   O2SAT 90.2 02/12/2019 2050    Condition  -gaurded  Family Communication  : Called patient's brother-left a voicemail  Code Status :  Full Code  Diet : Heart healthy/diabetic  Disposition Plan  :  Remain inpatient  Consults  : None  Procedures  :  None  DVT Prophylaxis  :  Lovenox  Lab Results  Component Value Date   PLT 150 02/16/2019    Inpatient Medications  Scheduled Meds:  amitriptyline  100 mg Oral QHS   aspirin EC  81 mg Oral BH-q7a   atorvastatin  80 mg Oral Daily   carvedilol  3.125 mg Oral BID WC   DULoxetine  40 mg Oral Daily   enoxaparin (LOVENOX) injection  60 mg Subcutaneous Q12H   feeding supplement (ENSURE ENLIVE)  237 mL Oral Q24H   feeding supplement (PRO-STAT SUGAR FREE 64)  30 mL Oral TID WC   furosemide  40 mg Intravenous Q12H   insulin aspart  0-15 Units Subcutaneous Q4H   insulin aspart protamine- aspart  50 Units Subcutaneous BID WC   mirtazapine  15 mg Oral QHS   nutrition supplement (JUVEN)  1 packet Oral BID BM   oxyCODONE  5 mg Oral Q6H   pantoprazole  40 mg Oral Daily   tamsulosin  0.4 mg Oral QPC supper   Continuous Infusions:  sodium chloride Stopped (02/15/19 1009)   cefTRIAXone (ROCEPHIN)  IV     metronidazole 500 mg (02/16/19 1213)   [START ON 02/17/2019] remdesivir 100 mg in NS 250 mL     PRN Meds:.sodium chloride, acetaminophen, albuterol, dextrose, guaiFENesin-dextromethorphan, oxyCODONE, polyethylene glycol, polyvinyl alcohol, tiZANidine  Antibiotics  :    Anti-infectives (From admission, onward)   Start     Dose/Rate Route Frequency Ordered Stop   02/17/19 1000  remdesivir 100 mg in sodium chloride 0.9 % 230 mL IVPB     100 mg 500 mL/hr over 30 Minutes Intravenous Every 24 hours 02/16/19 0912 02/21/19 0959   02/16/19 1230  cefTRIAXone (ROCEPHIN) 2 g in sodium chloride 0.9 % 100 mL IVPB     2 g 200 mL/hr over 30 Minutes Intravenous Every 24 hours 02/16/19  1134     02/16/19 1000  remdesivir 200 mg in sodium chloride 0.9 % 210 mL IVPB     200  mg 500 mL/hr over 30 Minutes Intravenous Once 02/16/19 0912 02/16/19 1037   02/15/19 1230  cefTRIAXone (ROCEPHIN) 1 g in sodium chloride 0.9 % 100 mL IVPB  Status:  Discontinued     1 g 200 mL/hr over 30 Minutes Intravenous Every 24 hours 02/15/19 1207 02/16/19 1134   02/13/19 1000  vancomycin (VANCOCIN) IVPB 1000 mg/200 mL premix  Status:  Discontinued     1,000 mg 200 mL/hr over 60 Minutes Intravenous Every 12 hours 02/12/19 2111 02/15/19 1207   02/13/19 0600  ceFEPIme (MAXIPIME) 2 g in sodium chloride 0.9 % 100 mL IVPB  Status:  Discontinued     2 g 200 mL/hr over 30 Minutes Intravenous Every 8 hours 02/12/19 2111 02/15/19 1207   02/12/19 2115  ceFEPIme (MAXIPIME) 2 g in sodium chloride 0.9 % 100 mL IVPB     2 g 200 mL/hr over 30 Minutes Intravenous  Once 02/12/19 2102 02/12/19 2155   02/12/19 2115  vancomycin (VANCOCIN) 2,500 mg in sodium chloride 0.9 % 500 mL IVPB     2,500 mg 250 mL/hr over 120 Minutes Intravenous  Once 02/12/19 2111 02/13/19 0010   02/12/19 2100  metroNIDAZOLE (FLAGYL) IVPB 500 mg     500 mg 100 mL/hr over 60 Minutes Intravenous Every 8 hours 02/12/19 2046         Time Spent in minutes  25   Oren Binet M.D on 02/16/2019 at 1:07 PM  To page go to [email or web address] - use universal password  [facility] -  Office  [phone]  Admit date - 02/12/2019    4    Objective:   Vitals:   02/16/19 0028 02/16/19 0517 02/16/19 0820 02/16/19 1201  BP: 121/70 125/66 136/70 123/70  Pulse: 80 80 91 89  Resp: 11 (!) 22 15 (!) 23  Temp: 98.2 F (36.8 C) 98.3 F (36.8 C) 97.9 F (36.6 C) 99 F (37.2 C)  TempSrc: Axillary Axillary Oral Oral  SpO2: 91% (!) 88% 91% 100%  Weight:  113.7 kg    Height:        Wt Readings from Last 3 Encounters:  02/16/19 113.7 kg  11/28/17 123.7 kg  11/03/17 120.7 kg     Intake/Output Summary (Last 24 hours) at 02/16/2019 1307 Last data filed at 02/16/2019 0519 Gross per 24 hour  Intake 303.22 ml  Output 675 ml   Net -371.78 ml     Physical Exam General appearance:Awake, alert, not in any distress.  Is comfortable-speaking in full sentences Eyes:no scleral icterus. HEENT: Atraumatic and Normocephalic Neck: supple, no JVD. Resp: Moving air bilaterally-diminished at bases. CVS: S1 S2 regular, no murmurs.  GI: Bowel sounds present, Non tender and not distended with no gaurding, rigidity or rebound. Extremities: Nephric and improvement in lower extremity edema-now only trace.  Dressing present in left lower extremity which I did not open-dressing was dry today.   both legs are warm to touch Neurology:  Non focal but with significant amount of generalized weakness Musculoskeletal:No digital cyanosis Skin:No Rash, warm and dry Wounds:N/A   Data Review:    CBC Recent Labs  Lab 02/12/19 1653 02/13/19 1015 02/14/19 0525 02/15/19 0500 02/16/19 0409  WBC 5.5 5.4 4.8 4.1 4.0  HGB 11.1* 12.3* 11.6* 11.0* 11.9*  HCT 39.0 45.6 40.8 40.5 44.7  PLT 155 177 148* 140* 150  MCV  66.7* 68.4* 67.1* 68.1* 68.8*  MCH 19.0* 18.4* 19.1* 18.5* 18.3*  MCHC 28.5* 27.0* 28.4* 27.2* 26.6*  RDW 24.1* 25.2* 24.4* 24.2* 24.8*  LYMPHSABS 1.4 1.0 1.0 1.2 1.2  MONOABS 0.3 0.3 0.3 0.2 0.4  EOSABS 0.0 0.0 0.0 0.0 0.0  BASOSABS 0.0 0.0 0.0 0.0 0.0    Chemistries  Recent Labs  Lab 02/12/19 1653 02/13/19 1015 02/14/19 0525 02/15/19 0500 02/16/19 0409  NA 139 142 141 144 142  K 3.9 4.2 3.9 3.4* 3.5  CL 100 97* 99 105 95*  CO2 30 34* 34* 29 36*  GLUCOSE 194* 338* 129* 114* 131*  BUN 29* 29* 26* 18 22  CREATININE 1.12 1.18 0.94 0.87 0.99  CALCIUM 7.8* 7.9* 7.8* 6.9* 8.3*  AST 38 38 31 29 36  ALT 21 25 20 19 21   ALKPHOS 120 125 105 83 101  BILITOT 0.2* 0.2* 0.5 0.5 0.4   ------------------------------------------------------------------------------------------------------------------ No results for input(s): CHOL, HDL, LDLCALC, TRIG, CHOLHDL, LDLDIRECT in the last 72 hours.  Lab Results  Component  Value Date   HGBA1C 8.0 (H) 02/13/2019   ------------------------------------------------------------------------------------------------------------------ No results for input(s): TSH, T4TOTAL, T3FREE, THYROIDAB in the last 72 hours.  Invalid input(s): FREET3 ------------------------------------------------------------------------------------------------------------------ Recent Labs    02/15/19 0500  FERRITIN 127    Coagulation profile No results for input(s): INR, PROTIME in the last 168 hours.  No results for input(s): DDIMER in the last 72 hours.  Cardiac Enzymes No results for input(s): CKMB, TROPONINI, MYOGLOBIN in the last 168 hours.  Invalid input(s): CK ------------------------------------------------------------------------------------------------------------------    Component Value Date/Time   BNP 18.4 02/16/2019 0409    Micro Results Recent Results (from the past 240 hour(s))  Blood Culture (routine x 2)     Status: None (Preliminary result)   Collection Time: 02/12/19  4:24 PM  Result Value Ref Range Status   Specimen Description   Final    BLOOD LEFT FOREARM Performed at [facility], [place] [address], [place], [place] [address]    Special Requests   Final    BOTTLES DRAWN AEROBIC AND ANAEROBIC Blood Culture adequate volume Performed at [place] [address], [place], [place] [address]    Culture   Final    NO GROWTH 4 DAYS Performed at [facility], [place] [address], [place], [place] [address]    Report Status PENDING  Incomplete  Blood Culture (routine x 2)     Status: Abnormal   Collection Time: 02/12/19  4:53 PM  Result Value Ref Range Status   Specimen Description   Final    BLOOD RIGHT ANTECUBITAL Performed at [place] [address] [place], [place] [address]    Special Requests   Final    BOTTLES DRAWN AEROBIC AND ANAEROBIC Blood Culture adequate volume Performed at  [place] [address] [place], [place] [address]    Culture  Setup Time   Final    GRAM POSITIVE COCCI IN CLUSTERS ANAEROBIC BOTTLE ONLY CRITICAL RESULT CALLED TO, READ BACK BY AND VERIFIED WITH: C. SHADE, PHARMD ([place]) AT 1410 ON 02/13/19 BY C. JESSUP, MLT.    Culture (A)  Final    STAPHYLOCOCCUS SPECIES (COAGULASE NEGATIVE) THE SIGNIFICANCE OF ISOLATING THIS ORGANISM FROM A SINGLE SET OF BLOOD CULTURES WHEN MULTIPLE SETS ARE DRAWN IS UNCERTAIN. PLEASE NOTIFY THE MICROBIOLOGY DEPARTMENT WITHIN ONE WEEK IF SPECIATION AND SENSITIVITIES ARE REQUIRED. Performed at [facility], [place] [address]  [place], [place] [address]    Report Status 02/15/2019 FINAL  Final  Blood Culture ID Panel (Reflexed)     Status: Abnormal   Collection Time: 02/12/19  4:53 PM  Result Value Ref Range Status   Enterococcus species NOT DETECTED NOT DETECTED Final   Listeria monocytogenes NOT DETECTED NOT DETECTED Final   Staphylococcus species DETECTED (A) NOT DETECTED Final    Comment: Methicillin (oxacillin) resistant coagulase negative staphylococcus. Possible blood culture contaminant (unless isolated from more than one blood culture draw or clinical case suggests pathogenicity). No antibiotic treatment is indicated for blood  culture contaminants. CRITICAL RESULT CALLED TO, READ BACK BY AND VERIFIED WITH: C. SHADE, PHARMD ([place]) AT 1410 ON 02/13/19 BY C. JESSUP, MLT.    Staphylococcus aureus (BCID) NOT DETECTED NOT DETECTED Final   Methicillin resistance DETECTED (A) NOT DETECTED Final    Comment: CRITICAL RESULT CALLED TO, READ BACK BY AND VERIFIED WITH: C. SHADE, PHARMD ([place]) AT 1410 ON 02/13/19 BY C. JESSUP, MLT.    Streptococcus species NOT DETECTED NOT DETECTED Final   Streptococcus agalactiae NOT DETECTED NOT DETECTED Final   Streptococcus pneumoniae NOT DETECTED NOT DETECTED Final   Streptococcus pyogenes NOT DETECTED NOT DETECTED Final   Acinetobacter baumannii NOT DETECTED  NOT DETECTED Final   Enterobacteriaceae species NOT DETECTED NOT DETECTED Final   Enterobacter cloacae complex NOT DETECTED NOT DETECTED Final   Escherichia coli NOT DETECTED NOT DETECTED Final   Klebsiella oxytoca NOT DETECTED NOT DETECTED Final   Klebsiella pneumoniae NOT DETECTED NOT DETECTED Final   Proteus species NOT DETECTED NOT DETECTED Final   Serratia marcescens NOT DETECTED NOT DETECTED Final   Haemophilus influenzae NOT DETECTED NOT DETECTED Final   Neisseria meningitidis NOT DETECTED NOT DETECTED Final   Pseudomonas aeruginosa NOT DETECTED NOT DETECTED Final   Candida albicans NOT DETECTED NOT DETECTED Final   Candida glabrata NOT DETECTED NOT DETECTED Final   Candida krusei NOT DETECTED NOT DETECTED Final   Candida parapsilosis NOT DETECTED NOT DETECTED Final   Candida tropicalis NOT DETECTED NOT DETECTED Final    Comment: Performed at [facility], [place] [address] [place], [place] [address]  SARS Coronavirus 2 (CEPHEID- Performed in [place] hospital lab), Hosp Order     Status: Abnormal   Collection Time: 02/12/19  7:18 PM  Result Value Ref Range Status   SARS Coronavirus 2 POSITIVE (A) NEGATIVE Final    Comment: RESULT CALLED TO, READ BACK BY AND VERIFIED WITH: O.IDOWU AT 2042 ON 02/12/19 BY N.THOMPSON (NOTE) If result is NEGATIVE SARS-CoV-2 target nucleic acids are NOT DETECTED. The SARS-CoV-2 RNA is generally detectable in upper and lower  respiratory specimens during the acute phase of infection. The lowest  concentration of SARS-CoV-2 viral copies this assay can detect is 250  copies / mL. A negative result does not preclude SARS-CoV-2 infection  and should not be used as the sole basis for treatment or other  patient management decisions.  A negative result may occur with  improper specimen collection / handling, submission of specimen other  than nasopharyngeal swab, presence of viral mutation(s) within the  areas targeted by this assay, and inadequate  number of viral copies  (<250 copies / mL). A negative result must be combined with clinical  observations, patient history, and epidemiological information. If result is POSITIVE SARS-CoV-2 target nucleic acids are DETECTE D. The SARS-CoV-2 RNA is generally detectable in upper and lower  respiratory specimens during the acute phase of infection.  Positive  results are indicative of active infection with SARS-CoV-2.  Clinical  correlation with patient history and other diagnostic information is  necessary to determine patient infection status.  Positive results do  not rule out bacterial infection or co-infection with other viruses. If result is PRESUMPTIVE POSTIVE SARS-CoV-2 nucleic acids MAY BE PRESENT.   A presumptive positive result was obtained on the submitted specimen  and confirmed on repeat testing.  While 2019 novel coronavirus  (SARS-CoV-2) nucleic acids may be present in the submitted sample  additional confirmatory testing may be necessary for epidemiological  and / or clinical management purposes  to differentiate between  SARS-CoV-2 and other Sarbecovirus currently known to infect humans.  If clinically indicated additional testing with an alternate test  methodology (LAB745 3) is advised. The SARS-CoV-2 RNA is generally  detectable in upper and lower respiratory specimens during the acute  phase of infection. The expected result is Negative. Fact Sheet for Patients:  StrictlyIdeas.no Fact Sheet for Healthcare Providers: BankingDealers.co.za This test is not yet approved or cleared by the [place] FDA and has been authorized for detection and/or diagnosis of SARS-CoV-2 by FDA under an Emergency Use Authorization (EUA).  This EUA will remain in effect (meaning this test can be used) for the duration of the COVID-19 declaration under Section 564(b)(1) of the Act, 21 U.S.C. section 360bbb-3(b)(1), unless the  authorization is terminated or revoked sooner. Performed at [facility], [place] [address], [place], [place] [address]   Culture, Urine     Status: None   Collection Time: 02/12/19  8:40 PM  Result Value Ref Range Status   Specimen Description   Final    Urine Performed at [place] [address], [place], [place] [address]    Special Requests   Final    NONE Performed at [facility], [place] [address] [place], [place] [address]    Culture   Final    NO GROWTH Performed at [facility], [place] [address], [place], [place] [address]    Report Status 02/14/2019 FINAL  Final  MRSA PCR Screening     Status: None   Collection Time: 02/13/19  9:00 AM  Result Value Ref Range Status   MRSA by PCR NEGATIVE NEGATIVE Final    Comment:        The GeneXpert MRSA Assay (FDA approved for NASAL specimens only), is one component of a comprehensive MRSA colonization surveillance program. It is not intended to diagnose MRSA infection nor to guide or monitor treatment for MRSA infections. Performed at [facility], [place] [address] [place], [place] [address]     Radiology Reports Ct Angio Chest Pe W Or Wo Contrast  Result Date: 02/16/2019 CLINICAL DATA:  COVID-19 infection, dyspnea, rule out PE EXAM: CT ANGIOGRAPHY CHEST WITH CONTRAST TECHNIQUE: Multidetector CT imaging of the chest was performed using the standard protocol during bolus administration of intravenous contrast. Multiplanar CT image reconstructions and MIPs were obtained to evaluate the vascular anatomy. CONTRAST:  152mL OMNIPAQUE IOHEXOL 350 MG/ML SOLN COMPARISON:  08/21/2016 FINDINGS: Cardiovascular: Satisfactory opacification of the pulmonary arteries to the segmental level. No evidence of pulmonary embolism. Cardiomegaly. Scattered coronary artery calcifications. Trace pericardial effusion. No pericardial effusion. Mediastinum/Nodes: There are  enlarged mediastinal lymph nodes, largest pretracheal nodes measuring 2.1 cm in short axis, increased compared to prior CT. Thyroid gland, trachea, and esophagus demonstrate no significant findings. Lungs/Pleura: There is bilateral, right greater than left heterogeneous and ground-glass opacity  with mild interlobular septal thickening. There is chronic atelectasis or scarring and volume loss of the right lung base. Upper Abdomen: No acute abnormality. Musculoskeletal: No chest wall abnormality. No acute or significant osseous findings. Review of the MIP images confirms the above findings. IMPRESSION: 1.  Negative examination for pulmonary embolism. 2. There is bilateral, right greater than left heterogeneous and ground-glass opacity with mild interlobular septal thickening. There is chronic atelectasis or scarring and volume loss of the right lung base. Findings are consistent viral infection and/or edema with chronic change at the right lung base. 3. Cardiomegaly, coronary artery disease, and trace pericardial effusion. Electronically Signed   By: Eddie Candle M.D.   On: 02/16/2019 11:26   Dg Chest Port 1 View  Result Date: 02/12/2019 CLINICAL DATA:  Shortness of breath EXAM: PORTABLE CHEST 1 VIEW COMPARISON:  Chest CT 08/21/2016 FINDINGS: Large right pleural effusion. Right basilar atelectasis. Cardiomediastinal contours are normal. No acute osseous abnormality. IMPRESSION: Large right pleural effusion with right basilar atelectasis. Electronically Signed   By: Ulyses Jarred M.D.   On: 02/12/2019 18:12   Dg Chest Port 1v Same Day  Result Date: 02/16/2019 CLINICAL DATA:  Shortness of breath. EXAM: PORTABLE CHEST 1 VIEW COMPARISON:  Radiograph of Feb 12, 2019. FINDINGS: Stable cardiomediastinal silhouette. No pneumothorax is noted. Moderate size right pleural effusion is noted with associated atelectasis or edema. Mild left basilar subsegmental atelectasis or edema is noted. Minimal left pleural effusion is  noted. Bony thorax is unremarkable. IMPRESSION: Moderate right pleural effusion is noted with associated atelectasis or edema. Mild left basilar subsegmental atelectasis or edema is noted. Electronically Signed   By: Marijo Conception M.D.   On: 02/16/2019 08:09

## 2019-02-17 LAB — COMPREHENSIVE METABOLIC PANEL
ALT: 19 U/L (ref 0–44)
AST: 30 U/L (ref 15–41)
Albumin: 2.3 g/dL — ABNORMAL LOW (ref 3.5–5.0)
Alkaline Phosphatase: 86 U/L (ref 38–126)
Anion gap: 11 (ref 5–15)
BUN: 30 mg/dL — ABNORMAL HIGH (ref 8–23)
CO2: 34 mmol/L — ABNORMAL HIGH (ref 22–32)
Calcium: 8.2 mg/dL — ABNORMAL LOW (ref 8.9–10.3)
Chloride: 97 mmol/L — ABNORMAL LOW (ref 98–111)
Creatinine, Ser: 0.87 mg/dL (ref 0.61–1.24)
GFR calc Af Amer: >60 mL/min (ref >60)
GFR calc non Af Amer: >60 mL/min (ref >60)
Glucose, Bld: 148 mg/dL — ABNORMAL HIGH (ref 70–99)
Potassium: 3.5 mmol/L (ref 3.5–5.1)
Sodium: 142 mmol/L (ref 135–145)
Total Bilirubin: 0.2 mg/dL — ABNORMAL LOW (ref 0.3–1.2)
Total Protein: 7.2 g/dL (ref 6.5–8.1)

## 2019-02-17 LAB — CBC WITH DIFFERENTIAL/PLATELET
Abs Immature Granulocytes: 0.02 10*3/uL (ref 0.00–0.07)
Basophils Absolute: 0 10*3/uL (ref 0.0–0.1)
Basophils Relative: 0 %
Eosinophils Absolute: 0 10*3/uL (ref 0.0–0.5)
Eosinophils Relative: 0 %
HCT: 40.5 % (ref 39.0–52.0)
Hemoglobin: 11.3 g/dL — ABNORMAL LOW (ref 13.0–17.0)
Immature Granulocytes: 0 %
Lymphocytes Relative: 26 %
Lymphs Abs: 1.2 10*3/uL (ref 0.7–4.0)
MCH: 18.6 pg — ABNORMAL LOW (ref 26.0–34.0)
MCHC: 27.9 g/dL — ABNORMAL LOW (ref 30.0–36.0)
MCV: 66.8 fL — ABNORMAL LOW (ref 80.0–100.0)
Monocytes Absolute: 0.4 10*3/uL (ref 0.1–1.0)
Monocytes Relative: 9 %
Neutro Abs: 3.1 10*3/uL (ref 1.7–7.7)
Neutrophils Relative %: 65 %
Platelets: 184 10*3/uL (ref 150–400)
RBC: 6.06 MIL/uL — ABNORMAL HIGH (ref 4.22–5.81)
RDW: 23.9 % — ABNORMAL HIGH (ref 11.5–15.5)
WBC: 4.7 10*3/uL (ref 4.0–10.5)
nRBC: 0 % (ref 0.0–0.2)

## 2019-02-17 LAB — CULTURE, BLOOD (ROUTINE X 2)
Culture: NO GROWTH
Special Requests: ADEQUATE

## 2019-02-17 LAB — C-REACTIVE PROTEIN: CRP: 21.5 mg/dL — ABNORMAL HIGH (ref <1.0)

## 2019-02-17 LAB — GLUCOSE, CAPILLARY
Glucose-Capillary: 132 mg/dL — ABNORMAL HIGH (ref 70–99)
Glucose-Capillary: 148 mg/dL — ABNORMAL HIGH (ref 70–99)
Glucose-Capillary: 164 mg/dL — ABNORMAL HIGH (ref 70–99)
Glucose-Capillary: 176 mg/dL — ABNORMAL HIGH (ref 70–99)
Glucose-Capillary: 189 mg/dL — ABNORMAL HIGH (ref 70–99)
Glucose-Capillary: 224 mg/dL — ABNORMAL HIGH (ref 70–99)
Glucose-Capillary: 307 mg/dL — ABNORMAL HIGH (ref 70–99)

## 2019-02-17 LAB — PROCALCITONIN: Procalcitonin: 0.31 ng/mL

## 2019-02-17 LAB — FERRITIN: Ferritin: 195 ng/mL (ref 24–336)

## 2019-02-17 LAB — D-DIMER, QUANTITATIVE: D-Dimer, Quant: 0.63 ug/mL-FEU — ABNORMAL HIGH (ref 0.00–0.50)

## 2019-02-17 MED ORDER — FUROSEMIDE 10 MG/ML IJ SOLN: 40.0000 mg | Freq: Every day | INTRAMUSCULAR | Status: DC

## 2019-02-17 MED ORDER — INSULIN GLARGINE 100 UNIT/ML ~~LOC~~ SOLN
20.0000 [IU] | Freq: Every day | SUBCUTANEOUS | Status: DC
  Administered 2019-02-17 – 2019-02-22 (×6): 20 [IU] via SUBCUTANEOUS
  Filled 2019-02-17 (×6): qty 0.2

## 2019-02-17 NOTE — Progress Notes (Signed)
CSW attempted call to [name] (spouse), no response.  Call attempted to [name] (brother), straight to vm, lvm.  Call attempted to [facility] - number not in service  CSW reached out to [facility], they report they have [name] (spouse) as contact in their records as [phone], number still not going through, no voicemail to leave message.   CSW will continue to attempt to contact family.   [place], [place]

## 2019-02-17 NOTE — Progress Notes (Signed)
PROGRESS NOTE                                                                                                                                                                                                             Patient Demographics:    [name], is a 63 y.o. male, DOB - 1956-08-01, LKT:[id number]  Outpatient Primary MD for the patient is Borum, Jaci Standard, MD    LOS - 5  Chief Complaint  Patient presents with   Shortness of Breath   Fever       Brief Narrative: Patient is a 63 y.o. male with PMHx of phonic lower extremity wounds, chronic diastolic heart failure, chronic hypoxic respiratory failure on home O2, HTN, DM-2-presented with dyspnea-found to have acute hypoxic respiratory failure in the setting of pleural effusion, decompensated diastolic heart failure and was also found to have COVID-19 infection.   Subjective:   Much better-he seems to have improved after requiring a lot more oxygen yesterday-his oxygen requirements are down to 4 L this morning.   Assessment  & Plan :    Acute on chronic Hypoxic Resp Failure due to decompensated diastolic heart failure and Covid 19 Viral pneumonia: Slowly improving-oxygen requirements have come down to 4 L this morning.  Remains on Remdesivir.  CT chest was negative for pulmonary embolism, confirmed bilateral groundglass opacities.  Volume status continues to improve-continue with intravenous Lasix but will change to daily dosing.  CRP still significantly elevated-but suspect patient has elevated CRP due to chronic lower extremity wounds.  Follow weights/electrolytes/intake output.    COVID-19 Labs:  Recent Labs    02/15/19 0500 02/16/19 0409 02/17/19 0307  DDIMER  --   --  0.63*  FERRITIN 127  --  195  CRP 12.9* 21.9* 21.5*    Lab Results  Component Value Date   SARSCOV2NAA POSITIVE (A) 02/12/2019     COVID-19 Medications:  5/26>>  Remdesivir  Right-sided pleural effusion: Suspect this is a transudate in the setting of decompensated heart failure.  Due to worsening hypoxia-CT chest done today which does not really show a large enough pleural effusion that needs a thoracocentesis.  Continue supportive care.  Left lower extremity ulceration: No evidence of wound infection on my exam on 5/25-has completed more than 7 days of antimicrobial therapy-hence will stop Rocephin and Flagyl.  Continue  local wound care.   1 out of 2 blood cultures positive for coag negative staph-likely a contaminant.  Hypokalemia: Continue replete and recheck  DM-2: Per RN-patient did not require any 70/30 insulin yesterday-we will switch over to Lantus 20 units and sliding scale and follow.  HTN: Controlled-continue Coreg and lisinopril  Dyslipidemia: Continue statin  Chronic pain syndrome: Stable-continue home regimen of narcotics  Depression: Stable with Cymbalta, Remeron  BPH: Continue Flomax  Chronic debility/deconditioning: Worsened by acute illness-Per patient he is mostly bed to wheelchair bound.  Claims he has not ambulated independently in years.  Morbid obesity    ABG:    Component Value Date/Time   PHART 7.304 (L) 02/12/2019 2050   PCO2ART 66.9 (HH) 02/12/2019 2050   PO2ART 73.1 (L) 02/12/2019 2050   HCO3 32.2 (H) 02/12/2019 2050   TCO2 21 11/02/2015 2039   ACIDBASEDEF 6.0 (H) 11/02/2015 2039   O2SAT 90.2 02/12/2019 2050    Condition -gaurded  Family Communication  : Spoke to patient's brother on 5/27  Code Status :  Full Code  Diet : Heart healthy/diabetic  Disposition Plan  :  Remain inpatient  Consults  : None  Procedures  :  None  DVT Prophylaxis  :  Lovenox  Lab Results  Component Value Date   PLT 184 02/17/2019    Inpatient Medications  Scheduled Meds:  amitriptyline  100 mg Oral QHS   aspirin EC  81 mg Oral BH-q7a   atorvastatin  80 mg Oral Daily   carvedilol  3.125 mg Oral BID WC    DULoxetine  40 mg Oral Daily   enoxaparin (LOVENOX) injection  60 mg Subcutaneous Q12H   feeding supplement (ENSURE ENLIVE)  237 mL Oral Q24H   feeding supplement (PRO-STAT SUGAR FREE 64)  30 mL Oral TID WC   [START ON 02/18/2019] furosemide  40 mg Intravenous Daily   insulin aspart  0-15 Units Subcutaneous Q4H   insulin glargine  20 Units Subcutaneous Daily   mirtazapine  15 mg Oral QHS   nutrition supplement (JUVEN)  1 packet Oral BID BM   oxyCODONE  10 mg Oral BID   pantoprazole  40 mg Oral Daily   pneumococcal 23 valent vaccine  0.5 mL Intramuscular Tomorrow-1000   tamsulosin  0.4 mg Oral QPC supper   Continuous Infusions:  sodium chloride 1,000 mL (02/16/19 1331)   remdesivir 100 mg in NS 250 mL 100 mg (02/17/19 1058)   PRN Meds:.sodium chloride, acetaminophen, albuterol, dextrose, guaiFENesin-dextromethorphan, oxyCODONE, polyethylene glycol, polyvinyl alcohol, tiZANidine  Antibiotics  :    Anti-infectives (From admission, onward)   Start     Dose/Rate Route Frequency Ordered Stop   02/17/19 1000  remdesivir 100 mg in sodium chloride 0.9 % 230 mL IVPB     100 mg 500 mL/hr over 30 Minutes Intravenous Every 24 hours 02/16/19 0912 02/21/19 0959   02/16/19 1230  cefTRIAXone (ROCEPHIN) 2 g in sodium chloride 0.9 % 100 mL IVPB  Status:  Discontinued     2 g 200 mL/hr over 30 Minutes Intravenous Every 24 hours 02/16/19 1134 02/17/19 1133   02/16/19 1000  remdesivir 200 mg in sodium chloride 0.9 % 210 mL IVPB     200 mg 500 mL/hr over 30 Minutes Intravenous Once 02/16/19 0912 02/16/19 1037   02/15/19 1230  cefTRIAXone (ROCEPHIN) 1 g in sodium chloride 0.9 % 100 mL IVPB  Status:  Discontinued     1 g 200 mL/hr over 30 Minutes Intravenous Every 24  hours 02/15/19 1207 02/16/19 1134   02/13/19 1000  vancomycin (VANCOCIN) IVPB 1000 mg/200 mL premix  Status:  Discontinued     1,000 mg 200 mL/hr over 60 Minutes Intravenous Every 12 hours 02/12/19 2111 02/15/19 1207    02/13/19 0600  ceFEPIme (MAXIPIME) 2 g in sodium chloride 0.9 % 100 mL IVPB  Status:  Discontinued     2 g 200 mL/hr over 30 Minutes Intravenous Every 8 hours 02/12/19 2111 02/15/19 1207   02/12/19 2115  ceFEPIme (MAXIPIME) 2 g in sodium chloride 0.9 % 100 mL IVPB     2 g 200 mL/hr over 30 Minutes Intravenous  Once 02/12/19 2102 02/12/19 2155   02/12/19 2115  vancomycin (VANCOCIN) 2,500 mg in sodium chloride 0.9 % 500 mL IVPB     2,500 mg 250 mL/hr over 120 Minutes Intravenous  Once 02/12/19 2111 02/13/19 0010   02/12/19 2100  metroNIDAZOLE (FLAGYL) IVPB 500 mg  Status:  Discontinued     500 mg 100 mL/hr over 60 Minutes Intravenous Every 8 hours 02/12/19 2046 02/17/19 1133       Time Spent in minutes  25   Oren Binet M.D on 02/17/2019 at 11:33 AM  To page go to [email or web address] - use universal password  [facility] -  Office  [phone]  Admit date - 02/12/2019    5    Objective:   Vitals:   02/16/19 2029 02/17/19 0014 02/17/19 0444 02/17/19 0802  BP: (!) 141/76 138/84 120/76 133/71  Pulse: 99 (!) 107 (!) 105 92  Resp:  18 15 16   Temp: 97.8 F (36.6 C) 99.3 F (37.4 C) 98.6 F (37 C) 98.5 F (36.9 C)  TempSrc: Axillary Oral Oral Oral  SpO2: 96% 93% 97% 98%  Weight:      Height:        Wt Readings from Last 3 Encounters:  02/16/19 113.7 kg  11/28/17 123.7 kg  11/03/17 120.7 kg     Intake/Output Summary (Last 24 hours) at 02/17/2019 1133 Last data filed at 02/17/2019 0939 Gross per 24 hour  Intake 1489.76 ml  Output 1975 ml  Net -485.24 ml     Physical Exam General appearance:Awake, alert, not in any distress.  Eyes:no scleral icterus. HEENT: Atraumatic and Normocephalic Neck: supple, no JVD. Resp:Good air entry bilaterally,no rales or rhonchi CVS: S1 S2 regular, no murmurs.  GI: Bowel sounds present, Non tender and not distended with no gaurding, rigidity or rebound. Extremities: B/L Lower Ext shows + edema, both legs are warm to  touch Neurology:  Non focal-but with generalized weakness all over. Psychiatric: Normal judgment and insight. Normal mood. Musculoskeletal:No digital cyanosis Skin:No Rash, warm and dry   Data Review:    CBC Recent Labs  Lab 02/13/19 1015 02/14/19 0525 02/15/19 0500 02/16/19 0409 02/17/19 0307  WBC 5.4 4.8 4.1 4.0 4.7  HGB 12.3* 11.6* 11.0* 11.9* 11.3*  HCT 45.6 40.8 40.5 44.7 40.5  PLT 177 148* 140* 150 184  MCV 68.4* 67.1* 68.1* 68.8* 66.8*  MCH 18.4* 19.1* 18.5* 18.3* 18.6*  MCHC 27.0* 28.4* 27.2* 26.6* 27.9*  RDW 25.2* 24.4* 24.2* 24.8* 23.9*  LYMPHSABS 1.0 1.0 1.2 1.2 1.2  MONOABS 0.3 0.3 0.2 0.4 0.4  EOSABS 0.0 0.0 0.0 0.0 0.0  BASOSABS 0.0 0.0 0.0 0.0 0.0    Chemistries  Recent Labs  Lab 02/13/19 1015 02/14/19 0525 02/15/19 0500 02/16/19 0409 02/17/19 0307  NA 142 141 144 142 142  K 4.2 3.9 3.4* 3.5 3.5  CL 97* 99 105 95* 97*  CO2 34* 34* 29 36* 34*  GLUCOSE 338* 129* 114* 131* 148*  BUN 29* 26* 18 22 30*  CREATININE 1.18 0.94 0.87 0.99 0.87  CALCIUM 7.9* 7.8* 6.9* 8.3* 8.2*  AST 38 31 29 36 30  ALT 25 20 19 21 19   ALKPHOS 125 105 83 101 86  BILITOT 0.2* 0.5 0.5 0.4 0.2*   ------------------------------------------------------------------------------------------------------------------ No results for input(s): CHOL, HDL, LDLCALC, TRIG, CHOLHDL, LDLDIRECT in the last 72 hours.  Lab Results  Component Value Date   HGBA1C 8.0 (H) 02/13/2019   ------------------------------------------------------------------------------------------------------------------ No results for input(s): TSH, T4TOTAL, T3FREE, THYROIDAB in the last 72 hours.  Invalid input(s): FREET3 ------------------------------------------------------------------------------------------------------------------ Recent Labs    02/15/19 0500 02/17/19 0307  FERRITIN 127 195    Coagulation profile No results for input(s): INR, PROTIME in the last 168 hours.  Recent Labs     02/17/19 0307  DDIMER 0.63*    Cardiac Enzymes No results for input(s): CKMB, TROPONINI, MYOGLOBIN in the last 168 hours.  Invalid input(s): CK ------------------------------------------------------------------------------------------------------------------    Component Value Date/Time   BNP 18.4 02/16/2019 0409    Micro Results Recent Results (from the past 240 hour(s))  Blood Culture (routine x 2)     Status: None   Collection Time: 02/12/19  4:24 PM  Result Value Ref Range Status   Specimen Description   Final    BLOOD LEFT FOREARM Performed at [facility], [place] [address] [place], [place] [address]    Special Requests   Final    BOTTLES DRAWN AEROBIC AND ANAEROBIC Blood Culture adequate volume Performed at [facility] [address], [place], [place] [address]    Culture   Final    NO GROWTH 5 DAYS Performed at [facility], [place] [address], [place], [place] [address]    Report Status 02/17/2019 FINAL  Final  Blood Culture (routine x 2)     Status: Abnormal   Collection Time: 02/12/19  4:53 PM  Result Value Ref Range Status   Specimen Description   Final    BLOOD RIGHT ANTECUBITAL Performed at [facility] [address] [place],  [address]    Special Requests   Final    BOTTLES DRAWN AEROBIC AND ANAEROBIC Blood Culture adequate volume Performed at [facility] [address] [place], [place] [address]    Culture  Setup Time   Final    GRAM POSITIVE COCCI IN CLUSTERS ANAEROBIC BOTTLE ONLY CRITICAL RESULT CALLED TO, READ BACK BY AND VERIFIED WITH: C. SHADE, PHARMD ([place]) AT 1410 ON 02/13/19 BY C. JESSUP, MLT.    Culture (A)  Final    STAPHYLOCOCCUS SPECIES (COAGULASE NEGATIVE) THE SIGNIFICANCE OF ISOLATING THIS ORGANISM FROM A SINGLE SET OF BLOOD CULTURES WHEN MULTIPLE SETS ARE DRAWN IS UNCERTAIN. PLEASE NOTIFY THE MICROBIOLOGY DEPARTMENT WITHIN ONE WEEK IF SPECIATION  AND SENSITIVITIES ARE REQUIRED. Performed at [facility], [place] [address] [place],  [address]    Report Status 02/15/2019 FINAL  Final  Blood Culture ID Panel (Reflexed)     Status: Abnormal   Collection Time: 02/12/19  4:53 PM  Result Value Ref Range Status   Enterococcus species NOT DETECTED NOT DETECTED Final   Listeria monocytogenes NOT DETECTED NOT DETECTED Final   Staphylococcus species DETECTED (A) NOT DETECTED Final    Comment: Methicillin (oxacillin) resistant coagulase negative staphylococcus. Possible blood culture contaminant (unless isolated from more than  one blood culture draw or clinical case suggests pathogenicity). No antibiotic treatment is indicated for blood  culture contaminants. CRITICAL RESULT CALLED TO, READ BACK BY AND VERIFIED WITH: C. SHADE, PHARMD ([place]) AT 1410 ON 02/13/19 BY C. JESSUP, MLT.    Staphylococcus aureus (BCID) NOT DETECTED NOT DETECTED Final   Methicillin resistance DETECTED (A) NOT DETECTED Final    Comment: CRITICAL RESULT CALLED TO, READ BACK BY AND VERIFIED WITH: C. SHADE, PHARMD ([place]) AT 1410 ON 02/13/19 BY C. JESSUP, MLT.    Streptococcus species NOT DETECTED NOT DETECTED Final   Streptococcus agalactiae NOT DETECTED NOT DETECTED Final   Streptococcus pneumoniae NOT DETECTED NOT DETECTED Final   Streptococcus pyogenes NOT DETECTED NOT DETECTED Final   Acinetobacter baumannii NOT DETECTED NOT DETECTED Final   Enterobacteriaceae species NOT DETECTED NOT DETECTED Final   Enterobacter cloacae complex NOT DETECTED NOT DETECTED Final   Escherichia coli NOT DETECTED NOT DETECTED Final   Klebsiella oxytoca NOT DETECTED NOT DETECTED Final   Klebsiella pneumoniae NOT DETECTED NOT DETECTED Final   Proteus species NOT DETECTED NOT DETECTED Final   Serratia marcescens NOT DETECTED NOT DETECTED Final   Haemophilus influenzae NOT DETECTED NOT DETECTED Final   Neisseria meningitidis NOT DETECTED NOT DETECTED Final   Pseudomonas aeruginosa  NOT DETECTED NOT DETECTED Final   Candida albicans NOT DETECTED NOT DETECTED Final   Candida glabrata NOT DETECTED NOT DETECTED Final   Candida krusei NOT DETECTED NOT DETECTED Final   Candida parapsilosis NOT DETECTED NOT DETECTED Final   Candida tropicalis NOT DETECTED NOT DETECTED Final    Comment: Performed at [facility], [place] [address] [place], [place] [address]  SARS Coronavirus 2 (CEPHEID- Performed in [facility]), Hosp Order     Status: Abnormal   Collection Time: 02/12/19  7:18 PM  Result Value Ref Range Status   SARS Coronavirus 2 POSITIVE (A) NEGATIVE Final    Comment: RESULT CALLED TO, READ BACK BY AND VERIFIED WITH: O.IDOWU AT 2042 ON 02/12/19 BY N.THOMPSON (NOTE) If result is NEGATIVE SARS-CoV-2 target nucleic acids are NOT DETECTED. The SARS-CoV-2 RNA is generally detectable in upper and lower  respiratory specimens during the acute phase of infection. The lowest  concentration of SARS-CoV-2 viral copies this assay can detect is 250  copies / mL. A negative result does not preclude SARS-CoV-2 infection  and should not be used as the sole basis for treatment or other  patient management decisions.  A negative result may occur with  improper specimen collection / handling, submission of specimen other  than nasopharyngeal swab, presence of viral mutation(s) within the  areas targeted by this assay, and inadequate number of viral copies  (<250 copies / mL). A negative result must be combined with clinical  observations, patient history, and epidemiological information. If result is POSITIVE SARS-CoV-2 target nucleic acids are DETECTE D. The SARS-CoV-2 RNA is generally detectable in upper and lower  respiratory specimens during the acute phase of infection.  Positive  results are indicative of active infection with SARS-CoV-2.  Clinical  correlation with patient history and other diagnostic information is  necessary to determine patient infection  status.  Positive results do  not rule out bacterial infection or co-infection with other viruses. If result is PRESUMPTIVE POSTIVE SARS-CoV-2 nucleic acids MAY BE PRESENT.   A presumptive positive result was obtained on the submitted specimen  and confirmed on repeat testing.  While 2019 novel coronavirus  (SARS-CoV-2) nucleic acids may be present  in the submitted sample  additional confirmatory testing may be necessary for epidemiological  and / or clinical management purposes  to differentiate between  SARS-CoV-2 and other Sarbecovirus currently known to infect humans.  If clinically indicated additional testing with an alternate test  methodology (LAB745 3) is advised. The SARS-CoV-2 RNA is generally  detectable in upper and lower respiratory specimens during the acute  phase of infection. The expected result is Negative. Fact Sheet for Patients:  StrictlyIdeas.no Fact Sheet for Healthcare Providers: BankingDealers.co.za This test is not yet approved or cleared by the [place] FDA and has been authorized for detection and/or diagnosis of SARS-CoV-2 by FDA under an Emergency Use Authorization (EUA).  This EUA will remain in effect (meaning this test can be used) for the duration of the COVID-19 declaration under Section 564(b)(1) of the Act, 21 U.S.C. section 360bbb-3(b)(1), unless the authorization is terminated or revoked sooner. Performed at [facility], [place] [address] [place], [place] [address]   Culture, Urine     Status: None   Collection Time: 02/12/19  8:40 PM  Result Value Ref Range Status   Specimen Description   Final    Urine Performed at [facility] [address], [place], [place] [address]    Special Requests   Final    NONE Performed at [facility], [place] [address] [place], [place] [address]    Culture   Final    NO GROWTH Performed at [facility], [place] [address], [place], [place] [address]    Report Status 02/14/2019 FINAL  Final  MRSA PCR Screening     Status: None   Collection Time: 02/13/19  9:00 AM  Result Value Ref Range Status   MRSA by PCR NEGATIVE NEGATIVE Final    Comment:        The GeneXpert MRSA Assay (FDA approved for NASAL specimens only), is one component of a comprehensive MRSA colonization surveillance program. It is not intended to diagnose MRSA infection nor to guide or monitor treatment for MRSA infections. Performed at [facility], [place] [address], [place], [place] [address]     Radiology Reports Ct Angio Chest Pe W Or Wo Contrast  Result Date: 02/16/2019 CLINICAL DATA:  COVID-19 infection, dyspnea, rule out PE EXAM: CT ANGIOGRAPHY CHEST WITH CONTRAST TECHNIQUE: Multidetector CT imaging of the chest was performed using the standard protocol during bolus administration of intravenous contrast. Multiplanar CT image reconstructions and MIPs were obtained to evaluate the vascular anatomy. CONTRAST:  123mL OMNIPAQUE IOHEXOL 350 MG/ML SOLN COMPARISON:  08/21/2016 FINDINGS: Cardiovascular: Satisfactory opacification of the pulmonary arteries to the segmental level. No evidence of pulmonary embolism. Cardiomegaly. Scattered coronary artery calcifications. Trace pericardial effusion. No pericardial effusion. Mediastinum/Nodes: There are enlarged mediastinal lymph nodes, largest pretracheal nodes measuring 2.1 cm in short axis, increased compared to prior CT. Thyroid gland, trachea, and esophagus demonstrate no significant findings. Lungs/Pleura: There is bilateral, right greater than left heterogeneous and ground-glass opacity with mild interlobular septal thickening. There is chronic atelectasis or scarring and volume loss of the right lung base. Upper Abdomen: No acute abnormality. Musculoskeletal: No chest wall abnormality. No acute or significant osseous findings. Review of the MIP  images confirms the above findings. IMPRESSION: 1.  Negative examination for pulmonary embolism. 2. There is bilateral, right greater than left heterogeneous and ground-glass opacity with mild interlobular septal thickening. There is chronic atelectasis or scarring and volume loss of the right lung base.  Findings are consistent viral infection and/or edema with chronic change at the right lung base. 3. Cardiomegaly, coronary artery disease, and trace pericardial effusion. Electronically Signed   By: Eddie Candle M.D.   On: 02/16/2019 11:26   Dg Chest Port 1 View  Result Date: 02/12/2019 CLINICAL DATA:  Shortness of breath EXAM: PORTABLE CHEST 1 VIEW COMPARISON:  Chest CT 08/21/2016 FINDINGS: Large right pleural effusion. Right basilar atelectasis. Cardiomediastinal contours are normal. No acute osseous abnormality. IMPRESSION: Large right pleural effusion with right basilar atelectasis. Electronically Signed   By: Ulyses Jarred M.D.   On: 02/12/2019 18:12   Dg Chest Port 1v Same Day  Result Date: 02/16/2019 CLINICAL DATA:  Shortness of breath. EXAM: PORTABLE CHEST 1 VIEW COMPARISON:  Radiograph of Feb 12, 2019. FINDINGS: Stable cardiomediastinal silhouette. No pneumothorax is noted. Moderate size right pleural effusion is noted with associated atelectasis or edema. Mild left basilar subsegmental atelectasis or edema is noted. Minimal left pleural effusion is noted. Bony thorax is unremarkable. IMPRESSION: Moderate right pleural effusion is noted with associated atelectasis or edema. Mild left basilar subsegmental atelectasis or edema is noted. Electronically Signed   By: Marijo Conception M.D.   On: 02/16/2019 08:09

## 2019-02-18 LAB — CBC
HCT: 41.8 % (ref 39.0–52.0)
Hemoglobin: 11.3 g/dL — ABNORMAL LOW (ref 13.0–17.0)
MCH: 18.3 pg — ABNORMAL LOW (ref 26.0–34.0)
MCHC: 27 g/dL — ABNORMAL LOW (ref 30.0–36.0)
MCV: 67.7 fL — ABNORMAL LOW (ref 80.0–100.0)
Platelets: 221 10*3/uL (ref 150–400)
RBC: 6.17 MIL/uL — ABNORMAL HIGH (ref 4.22–5.81)
RDW: 24.1 % — ABNORMAL HIGH (ref 11.5–15.5)
WBC: 3.9 10*3/uL — ABNORMAL LOW (ref 4.0–10.5)
nRBC: 0 % (ref 0.0–0.2)

## 2019-02-18 LAB — GLUCOSE, CAPILLARY
Glucose-Capillary: 130 mg/dL — ABNORMAL HIGH (ref 70–99)
Glucose-Capillary: 122 mg/dL — ABNORMAL HIGH (ref 70–99)
Glucose-Capillary: 184 mg/dL — ABNORMAL HIGH (ref 70–99)
Glucose-Capillary: 205 mg/dL — ABNORMAL HIGH (ref 70–99)
Glucose-Capillary: 195 mg/dL — ABNORMAL HIGH (ref 70–99)
Glucose-Capillary: 147 mg/dL — ABNORMAL HIGH (ref 70–99)

## 2019-02-18 LAB — COMPREHENSIVE METABOLIC PANEL
ALT: 17 U/L (ref 0–44)
AST: 25 U/L (ref 15–41)
Albumin: 2.2 g/dL — ABNORMAL LOW (ref 3.5–5.0)
Alkaline Phosphatase: 86 U/L (ref 38–126)
Anion gap: 9 (ref 5–15)
BUN: 23 mg/dL (ref 8–23)
CO2: 37 mmol/L — ABNORMAL HIGH (ref 22–32)
Calcium: 8 mg/dL — ABNORMAL LOW (ref 8.9–10.3)
Chloride: 100 mmol/L (ref 98–111)
Creatinine, Ser: 0.89 mg/dL (ref 0.61–1.24)
GFR calc Af Amer: >60 mL/min (ref >60)
GFR calc non Af Amer: >60 mL/min (ref >60)
Glucose, Bld: 130 mg/dL — ABNORMAL HIGH (ref 70–99)
Potassium: 2.9 mmol/L — ABNORMAL LOW (ref 3.5–5.1)
Sodium: 146 mmol/L — ABNORMAL HIGH (ref 135–145)
Total Bilirubin: 0.4 mg/dL (ref 0.3–1.2)
Total Protein: 6.9 g/dL (ref 6.5–8.1)

## 2019-02-18 LAB — FERRITIN: Ferritin: 171 ng/mL (ref 24–336)

## 2019-02-18 LAB — PROCALCITONIN: Procalcitonin: 0.27 ng/mL

## 2019-02-18 LAB — LACTATE DEHYDROGENASE: LDH: 246 U/L — ABNORMAL HIGH (ref 98–192)

## 2019-02-18 LAB — C-REACTIVE PROTEIN: CRP: 18.6 mg/dL — ABNORMAL HIGH (ref <1.0)

## 2019-02-18 LAB — D-DIMER, QUANTITATIVE: D-Dimer, Quant: 0.7 ug/mL-FEU — ABNORMAL HIGH (ref 0.00–0.50)

## 2019-02-18 MED ORDER — POTASSIUM CHLORIDE CRYS ER 20 MEQ PO TBCR
40.0000 meq | EXTENDED_RELEASE_TABLET | ORAL | Status: AC
  Administered 2019-02-18 (×2): 40 meq via ORAL
  Filled 2019-02-18 (×2): qty 2

## 2019-02-18 NOTE — Progress Notes (Signed)
PROGRESS NOTE                                                                                                                                                                                                             Patient Demographics:    Nyzaiah Kai, is a 63 y.o. male, DOB - 01-26-1956, ZOX:[id number]  Outpatient Primary MD for the patient is Borum, Jaci Standard, MD    LOS - 6  Chief Complaint  Patient presents with   Shortness of Breath   Fever       Brief Narrative: Patient is a 63 y.o. male with PMHx of phonic lower extremity wounds, chronic diastolic heart failure, chronic hypoxic respiratory failure on home O2, HTN, DM-2-presented with dyspnea-found to have acute hypoxic respiratory failure in the setting of pleural effusion, decompensated diastolic heart failure and was also found to have COVID-19 infection.  Initially improved with diuresis-however acutely worsened with worsening hypoxia-and was started on Remdesivir with significant clinical improvement.   Subjective:  No major issues overnight-lying comfortably in bed.  No chest pain-denies any shortness of breath.  No nausea vomiting.   Assessment  & Plan :    Acute on chronic Hypoxic Resp Failure due to decompensated diastolic heart failure and Covid 19 Viral pneumonia: Continues to slowly improve-remains stable on 4 L of oxygen-remains on a 5-day course of Remdesivir.  CT chest negative for pulmonary embolism, does show bilateral groundglass opacities.  CRP is significantly elevated-downtrending-but could be chronically elevated due to lower extremity wounds.  Volume status is much stable-hold Lasix today-continue to follow weights/electrolytes/intake output.      COVID-19 Labs:  Recent Labs    02/16/19 0409 02/17/19 0307 02/18/19 0335 02/18/19 0350  DDIMER  --  0.63*  --  0.70*  FERRITIN  --  195 171  --   LDH  --   --   --  246*  CRP 21.9*  21.5* 18.6*  --     Lab Results  Component Value Date   SARSCOV2NAA POSITIVE (A) 02/12/2019     COVID-19 Medications:  5/26>> Remdesivir  Right-sided pleural effusion: Suspect this is a transudate in the setting of decompensated heart failure.  Due to worsening hypoxia-CT chest done today which does not really show a large enough pleural effusion that needs a thoracocentesis.  Continue supportive care.  Left lower extremity ulceration: No evidence of wound infection on my exam on 5/25-has completed more than 7 days of antimicrobial therapy-no longer Rocephin and Flagyl.  Continue local wound care.   1 out of 2 blood cultures positive for coag negative staph-likely a contaminant.  Hypokalemia: Secondary to Lasix-continue to replete and recheck.  DM-2: CBGs stable-previously on insulin 70/30-but due to recurrent hypoglycemia-we will switch over to Lantus 20 units and SSI-CBGs now stable.  Follow.  HTN: Controlled-continue Coreg and lisinopril  Dyslipidemia: Continue statin  Chronic pain syndrome: Stable-continue home regimen of narcotics  Depression: Stable with Cymbalta, Remeron  BPH: Continue Flomax  Chronic debility/deconditioning: Worsened by acute illness-Per patient he is mostly bed to wheelchair bound.  Claims he has not ambulated independently in years.  Morbid obesity    ABG:    Component Value Date/Time   PHART 7.304 (L) 02/12/2019 2050   PCO2ART 66.9 (HH) 02/12/2019 2050   PO2ART 73.1 (L) 02/12/2019 2050   HCO3 32.2 (H) 02/12/2019 2050   TCO2 21 11/02/2015 2039   ACIDBASEDEF 6.0 (H) 11/02/2015 2039   O2SAT 90.2 02/12/2019 2050    Condition -gaurded  Family Communication  : Spoke to patient's brother on 5/27-none at bedside this morning  Code Status :  Full Code  Diet : Heart healthy/diabetic  Disposition Plan  :  Remain inpatient  Consults  : None  Procedures  :  None  DVT Prophylaxis  :  Lovenox  Lab Results  Component Value Date   PLT 221  02/18/2019    Inpatient Medications  Scheduled Meds:  amitriptyline  100 mg Oral QHS   aspirin EC  81 mg Oral BH-q7a   atorvastatin  80 mg Oral Daily   carvedilol  3.125 mg Oral BID WC   DULoxetine  40 mg Oral Daily   enoxaparin (LOVENOX) injection  60 mg Subcutaneous Q12H   feeding supplement (ENSURE ENLIVE)  237 mL Oral Q24H   feeding supplement (PRO-STAT SUGAR FREE 64)  30 mL Oral TID WC   insulin aspart  0-15 Units Subcutaneous Q4H   insulin glargine  20 Units Subcutaneous Daily   mirtazapine  15 mg Oral QHS   nutrition supplement (JUVEN)  1 packet Oral BID BM   oxyCODONE  10 mg Oral BID   pantoprazole  40 mg Oral Daily   potassium chloride  40 mEq Oral Q4H   tamsulosin  0.4 mg Oral QPC supper   Continuous Infusions:  sodium chloride 1,000 mL (02/16/19 1331)   remdesivir 100 mg in NS 250 mL 100 mg (02/18/19 0956)   PRN Meds:.sodium chloride, acetaminophen, albuterol, dextrose, guaiFENesin-dextromethorphan, oxyCODONE, polyethylene glycol, polyvinyl alcohol, tiZANidine  Antibiotics  :    Anti-infectives (From admission, onward)   Start     Dose/Rate Route Frequency Ordered Stop   02/17/19 1000  remdesivir 100 mg in sodium chloride 0.9 % 230 mL IVPB     100 mg 500 mL/hr over 30 Minutes Intravenous Every 24 hours 02/16/19 0912 02/21/19 0959   02/16/19 1230  cefTRIAXone (ROCEPHIN) 2 g in sodium chloride 0.9 % 100 mL IVPB  Status:  Discontinued     2 g 200 mL/hr over 30 Minutes Intravenous Every 24 hours 02/16/19 1134 02/17/19 1133   02/16/19 1000  remdesivir 200 mg in sodium chloride 0.9 % 210 mL IVPB     200 mg 500 mL/hr over 30 Minutes Intravenous Once 02/16/19 0912 02/16/19 1037   02/15/19 1230  cefTRIAXone (ROCEPHIN) 1 g in sodium chloride  0.9 % 100 mL IVPB  Status:  Discontinued     1 g 200 mL/hr over 30 Minutes Intravenous Every 24 hours 02/15/19 1207 02/16/19 1134   02/13/19 1000  vancomycin (VANCOCIN) IVPB 1000 mg/200 mL premix  Status:   Discontinued     1,000 mg 200 mL/hr over 60 Minutes Intravenous Every 12 hours 02/12/19 2111 02/15/19 1207   02/13/19 0600  ceFEPIme (MAXIPIME) 2 g in sodium chloride 0.9 % 100 mL IVPB  Status:  Discontinued     2 g 200 mL/hr over 30 Minutes Intravenous Every 8 hours 02/12/19 2111 02/15/19 1207   02/12/19 2115  ceFEPIme (MAXIPIME) 2 g in sodium chloride 0.9 % 100 mL IVPB     2 g 200 mL/hr over 30 Minutes Intravenous  Once 02/12/19 2102 02/12/19 2155   02/12/19 2115  vancomycin (VANCOCIN) 2,500 mg in sodium chloride 0.9 % 500 mL IVPB     2,500 mg 250 mL/hr over 120 Minutes Intravenous  Once 02/12/19 2111 02/13/19 0010   02/12/19 2100  metroNIDAZOLE (FLAGYL) IVPB 500 mg  Status:  Discontinued     500 mg 100 mL/hr over 60 Minutes Intravenous Every 8 hours 02/12/19 2046 02/17/19 1133       Time Spent in minutes  25   Oren Binet M.D on 02/18/2019 at 12:17 PM  To page go to [email or web address] - use universal password  [facility] -  Office  [phone]  Admit date - 02/12/2019    6    Objective:   Vitals:   02/18/19 0412 02/18/19 0425 02/18/19 0800 02/18/19 0900  BP: 135/75  132/67   Pulse: 84  85 90  Resp: 18  (!) 21 16  Temp: 98.6 F (37 C)     TempSrc: Oral     SpO2: 97%  92% 97%  Weight:  111.1 kg    Height:        Wt Readings from Last 3 Encounters:  02/18/19 111.1 kg  11/28/17 123.7 kg  11/03/17 120.7 kg     Intake/Output Summary (Last 24 hours) at 02/18/2019 1217 Last data filed at 02/18/2019 0427 Gross per 24 hour  Intake 948.32 ml  Output 1100 ml  Net -151.68 ml     Physical Exam General appearance:Awake, alert, not in any distress.  Eyes:no scleral icterus. HEENT: Atraumatic and Normocephalic Neck: supple, no JVD. Resp:Good air entry bilaterally,no rales or rhonchi CVS: S1 S2 regular, no murmurs.  GI: Bowel sounds present, Non tender and not distended with no gaurding, rigidity or rebound. Extremities: B/L Lower Ext shows trace edema-both  legs are warm to touch.  Neurology: Nonfocal but with significant amount of generalized weakness Psychiatric: Normal judgment and insight. Normal mood. Musculoskeletal:No digital cyanosis Skin:No Rash, warm and dry    Data Review:    CBC Recent Labs  Lab 02/13/19 1015 02/14/19 0525 02/15/19 0500 02/16/19 0409 02/17/19 0307 02/18/19 0350  WBC 5.4 4.8 4.1 4.0 4.7 3.9*  HGB 12.3* 11.6* 11.0* 11.9* 11.3* 11.3*  HCT 45.6 40.8 40.5 44.7 40.5 41.8  PLT 177 148* 140* 150 184 221  MCV 68.4* 67.1* 68.1* 68.8* 66.8* 67.7*  MCH 18.4* 19.1* 18.5* 18.3* 18.6* 18.3*  MCHC 27.0* 28.4* 27.2* 26.6* 27.9* 27.0*  RDW 25.2* 24.4* 24.2* 24.8* 23.9* 24.1*  LYMPHSABS 1.0 1.0 1.2 1.2 1.2  --   MONOABS 0.3 0.3 0.2 0.4 0.4  --   EOSABS 0.0 0.0 0.0 0.0 0.0  --   BASOSABS 0.0 0.0 0.0 0.0 0.0  --  Chemistries  Recent Labs  Lab 02/14/19 0525 02/15/19 0500 02/16/19 0409 02/17/19 0307 02/18/19 0350  NA 141 144 142 142 146*  K 3.9 3.4* 3.5 3.5 2.9*  CL 99 105 95* 97* 100  CO2 34* 29 36* 34* 37*  GLUCOSE 129* 114* 131* 148* 130*  BUN 26* 18 22 30* 23  CREATININE 0.94 0.87 0.99 0.87 0.89  CALCIUM 7.8* 6.9* 8.3* 8.2* 8.0*  AST 31 29 36 30 25  ALT 20 19 21 19 17   ALKPHOS 105 83 101 86 86  BILITOT 0.5 0.5 0.4 0.2* 0.4   ------------------------------------------------------------------------------------------------------------------ No results for input(s): CHOL, HDL, LDLCALC, TRIG, CHOLHDL, LDLDIRECT in the last 72 hours.  Lab Results  Component Value Date   HGBA1C 8.0 (H) 02/13/2019   ------------------------------------------------------------------------------------------------------------------ No results for input(s): TSH, T4TOTAL, T3FREE, THYROIDAB in the last 72 hours.  Invalid input(s): FREET3 ------------------------------------------------------------------------------------------------------------------ Recent Labs    02/17/19 0307 02/18/19 0335  FERRITIN 195 171     Coagulation profile No results for input(s): INR, PROTIME in the last 168 hours.  Recent Labs    02/17/19 0307 02/18/19 0350  DDIMER 0.63* 0.70*    Cardiac Enzymes No results for input(s): CKMB, TROPONINI, MYOGLOBIN in the last 168 hours.  Invalid input(s): CK ------------------------------------------------------------------------------------------------------------------    Component Value Date/Time   BNP 18.4 02/16/2019 0409    Micro Results Recent Results (from the past 240 hour(s))  Blood Culture (routine x 2)     Status: None   Collection Time: 02/12/19  4:24 PM  Result Value Ref Range Status   Specimen Description   Final    BLOOD LEFT FOREARM Performed at [facility], [place] [address], [place], [place] [address]    Special Requests   Final    BOTTLES DRAWN AEROBIC AND ANAEROBIC Blood Culture adequate volume Performed at [place] [address], [place], [place] [address]    Culture   Final    NO GROWTH 5 DAYS Performed at [facility], [place] [address], [place], [place] [address]    Report Status 02/17/2019 FINAL  Final  Blood Culture (routine x 2)     Status: Abnormal   Collection Time: 02/12/19  4:53 PM  Result Value Ref Range Status   Specimen Description   Final    BLOOD RIGHT ANTECUBITAL Performed at [place] [address], [place], [place] [address]    Special Requests   Final    BOTTLES DRAWN AEROBIC AND ANAEROBIC Blood Culture adequate volume Performed at [place] [address], [place], [place] [address]    Culture  Setup Time   Final    GRAM POSITIVE COCCI IN CLUSTERS ANAEROBIC BOTTLE ONLY CRITICAL RESULT CALLED TO, READ BACK BY AND VERIFIED WITH: C. SHADE, PHARMD ([place]) AT 1410 ON 02/13/19 BY C. JESSUP, MLT.    Culture (A)  Final    STAPHYLOCOCCUS SPECIES (COAGULASE NEGATIVE) THE SIGNIFICANCE OF ISOLATING THIS ORGANISM FROM A SINGLE SET OF  BLOOD CULTURES WHEN MULTIPLE SETS ARE DRAWN IS UNCERTAIN. PLEASE NOTIFY THE MICROBIOLOGY DEPARTMENT WITHIN ONE WEEK IF SPECIATION AND SENSITIVITIES ARE REQUIRED. Performed at [facility], [place] [address], [place],  [address]    Report Status 02/15/2019 FINAL  Final  Blood Culture ID Panel (Reflexed)     Status: Abnormal   Collection Time: 02/12/19  4:53 PM  Result Value Ref Range Status   Enterococcus species NOT DETECTED NOT DETECTED Final   Listeria  monocytogenes NOT DETECTED NOT DETECTED Final   Staphylococcus species DETECTED (A) NOT DETECTED Final    Comment: Methicillin (oxacillin) resistant coagulase negative staphylococcus. Possible blood culture contaminant (unless isolated from more than one blood culture draw or clinical case suggests pathogenicity). No antibiotic treatment is indicated for blood  culture contaminants. CRITICAL RESULT CALLED TO, READ BACK BY AND VERIFIED WITH: C. SHADE, PHARMD ([place]) AT 1410 ON 02/13/19 BY C. JESSUP, MLT.    Staphylococcus aureus (BCID) NOT DETECTED NOT DETECTED Final   Methicillin resistance DETECTED (A) NOT DETECTED Final    Comment: CRITICAL RESULT CALLED TO, READ BACK BY AND VERIFIED WITH: C. SHADE, PHARMD ([place]) AT 1410 ON 02/13/19 BY C. JESSUP, MLT.    Streptococcus species NOT DETECTED NOT DETECTED Final   Streptococcus agalactiae NOT DETECTED NOT DETECTED Final   Streptococcus pneumoniae NOT DETECTED NOT DETECTED Final   Streptococcus pyogenes NOT DETECTED NOT DETECTED Final   Acinetobacter baumannii NOT DETECTED NOT DETECTED Final   Enterobacteriaceae species NOT DETECTED NOT DETECTED Final   Enterobacter cloacae complex NOT DETECTED NOT DETECTED Final   Escherichia coli NOT DETECTED NOT DETECTED Final   Klebsiella oxytoca NOT DETECTED NOT DETECTED Final   Klebsiella pneumoniae NOT DETECTED NOT DETECTED Final   Proteus species NOT DETECTED NOT DETECTED Final   Serratia marcescens NOT DETECTED NOT DETECTED Final    Haemophilus influenzae NOT DETECTED NOT DETECTED Final   Neisseria meningitidis NOT DETECTED NOT DETECTED Final   Pseudomonas aeruginosa NOT DETECTED NOT DETECTED Final   Candida albicans NOT DETECTED NOT DETECTED Final   Candida glabrata NOT DETECTED NOT DETECTED Final   Candida krusei NOT DETECTED NOT DETECTED Final   Candida parapsilosis NOT DETECTED NOT DETECTED Final   Candida tropicalis NOT DETECTED NOT DETECTED Final    Comment: Performed at [facility], [place] [address], [place], [place] [address]  SARS Coronavirus 2 (CEPHEID- Performed in [place] hospital lab), Hosp Order     Status: Abnormal   Collection Time: 02/12/19  7:18 PM  Result Value Ref Range Status   SARS Coronavirus 2 POSITIVE (A) NEGATIVE Final    Comment: RESULT CALLED TO, READ BACK BY AND VERIFIED WITH: O.IDOWU AT 2042 ON 02/12/19 BY N.THOMPSON (NOTE) If result is NEGATIVE SARS-CoV-2 target nucleic acids are NOT DETECTED. The SARS-CoV-2 RNA is generally detectable in upper and lower  respiratory specimens during the acute phase of infection. The lowest  concentration of SARS-CoV-2 viral copies this assay can detect is 250  copies / mL. A negative result does not preclude SARS-CoV-2 infection  and should not be used as the sole basis for treatment or other  patient management decisions.  A negative result may occur with  improper specimen collection / handling, submission of specimen other  than nasopharyngeal swab, presence of viral mutation(s) within the  areas targeted by this assay, and inadequate number of viral copies  (<250 copies / mL). A negative result must be combined with clinical  observations, patient history, and epidemiological information. If result is POSITIVE SARS-CoV-2 target nucleic acids are DETECTE D. The SARS-CoV-2 RNA is generally detectable in upper and lower  respiratory specimens during the acute phase of infection.  Positive  results are indicative of active infection with  SARS-CoV-2.  Clinical  correlation with patient history and other diagnostic information is  necessary to determine patient infection status.  Positive results do  not rule out bacterial infection or co-infection with other viruses. If result is PRESUMPTIVE POSTIVE SARS-CoV-2 nucleic  acids MAY BE PRESENT.   A presumptive positive result was obtained on the submitted specimen  and confirmed on repeat testing.  While 2019 novel coronavirus  (SARS-CoV-2) nucleic acids may be present in the submitted sample  additional confirmatory testing may be necessary for epidemiological  and / or clinical management purposes  to differentiate between  SARS-CoV-2 and other Sarbecovirus currently known to infect humans.  If clinically indicated additional testing with an alternate test  methodology (LAB745 3) is advised. The SARS-CoV-2 RNA is generally  detectable in upper and lower respiratory specimens during the acute  phase of infection. The expected result is Negative. Fact Sheet for Patients:  StrictlyIdeas.no Fact Sheet for Healthcare Providers: BankingDealers.co.za This test is not yet approved or cleared by the [place] FDA and has been authorized for detection and/or diagnosis of SARS-CoV-2 by FDA under an Emergency Use Authorization (EUA).  This EUA will remain in effect (meaning this test can be used) for the duration of the COVID-19 declaration under Section 564(b)(1) of the Act, 21 U.S.C. section 360bbb-3(b)(1), unless the authorization is terminated or revoked sooner. Performed at [facility], [place] [address] [place], [place] [address]   Culture, Urine     Status: None   Collection Time: 02/12/19  8:40 PM  Result Value Ref Range Status   Specimen Description   Final    Urine Performed at [facility] [place] [address] [place], [place] [address]    Special Requests   Final    NONE Performed  at [facility], [place] [address], [place], [place] [address]    Culture   Final    NO GROWTH Performed at [facility], [place] [address], [place], [place] [address]    Report Status 02/14/2019 FINAL  Final  MRSA PCR Screening     Status: None   Collection Time: 02/13/19  9:00 AM  Result Value Ref Range Status   MRSA by PCR NEGATIVE NEGATIVE Final    Comment:        The GeneXpert MRSA Assay (FDA approved for NASAL specimens only), is one component of a comprehensive MRSA colonization surveillance program. It is not intended to diagnose MRSA infection nor to guide or monitor treatment for MRSA infections. Performed at [facility], [place] [address] [place], [place] [address]     Radiology Reports Ct Angio Chest Pe W Or Wo Contrast  Result Date: 02/16/2019 CLINICAL DATA:  COVID-19 infection, dyspnea, rule out PE EXAM: CT ANGIOGRAPHY CHEST WITH CONTRAST TECHNIQUE: Multidetector CT imaging of the chest was performed using the standard protocol during bolus administration of intravenous contrast. Multiplanar CT image reconstructions and MIPs were obtained to evaluate the vascular anatomy. CONTRAST:  16mL OMNIPAQUE IOHEXOL 350 MG/ML SOLN COMPARISON:  08/21/2016 FINDINGS: Cardiovascular: Satisfactory opacification of the pulmonary arteries to the segmental level. No evidence of pulmonary embolism. Cardiomegaly. Scattered coronary artery calcifications. Trace pericardial effusion. No pericardial effusion. Mediastinum/Nodes: There are enlarged mediastinal lymph nodes, largest pretracheal nodes measuring 2.1 cm in short axis, increased compared to prior CT. Thyroid gland, trachea, and esophagus demonstrate no significant findings. Lungs/Pleura: There is bilateral, right greater than left heterogeneous and ground-glass opacity with mild interlobular septal thickening. There is chronic atelectasis or scarring and volume loss of the right lung base. Upper  Abdomen: No acute abnormality. Musculoskeletal: No chest wall abnormality. No acute or significant osseous findings. Review of the MIP images confirms the above findings. IMPRESSION: 1.  Negative examination  for pulmonary embolism. 2. There is bilateral, right greater than left heterogeneous and ground-glass opacity with mild interlobular septal thickening. There is chronic atelectasis or scarring and volume loss of the right lung base. Findings are consistent viral infection and/or edema with chronic change at the right lung base. 3. Cardiomegaly, coronary artery disease, and trace pericardial effusion. Electronically Signed   By: Eddie Candle M.D.   On: 02/16/2019 11:26   Dg Chest Port 1 View  Result Date: 02/12/2019 CLINICAL DATA:  Shortness of breath EXAM: PORTABLE CHEST 1 VIEW COMPARISON:  Chest CT 08/21/2016 FINDINGS: Large right pleural effusion. Right basilar atelectasis. Cardiomediastinal contours are normal. No acute osseous abnormality. IMPRESSION: Large right pleural effusion with right basilar atelectasis. Electronically Signed   By: Ulyses Jarred M.D.   On: 02/12/2019 18:12   Dg Chest Port 1v Same Day  Result Date: 02/16/2019 CLINICAL DATA:  Shortness of breath. EXAM: PORTABLE CHEST 1 VIEW COMPARISON:  Radiograph of Feb 12, 2019. FINDINGS: Stable cardiomediastinal silhouette. No pneumothorax is noted. Moderate size right pleural effusion is noted with associated atelectasis or edema. Mild left basilar subsegmental atelectasis or edema is noted. Minimal left pleural effusion is noted. Bony thorax is unremarkable. IMPRESSION: Moderate right pleural effusion is noted with associated atelectasis or edema. Mild left basilar subsegmental atelectasis or edema is noted. Electronically Signed   By: Marijo Conception M.D.   On: 02/16/2019 08:09

## 2019-02-18 NOTE — Progress Notes (Signed)
Physical Therapy Treatment Patient Details Name: [name] MRN: [id number] DOB: [birth year] Today's Date: 02/18/2019    History of Present Illness Patient is a 63 y.o. male with PMHx of chronic lower extremity wounds, chronic diastolic heart failure, chronic hypoxic respiratory failure on home O2, HTN, DM-2-pesented 02/12/19 for SNF  with dyspnea-found to have acute hypoxic respiratory failure in the setting of pleural effusion, decompensated diastolic heart failure and was also found to have COVID-19 infection    PT Comments    Pt  Is very stiff and has decreased hip flexion  Which impedes sitting at bed side. Patient requires extensive assistance for balance. Patient assisted with LE exercises in bed, placed in bed chair position. Patient given orange theraband for UE exercises which patient demonstrated, Continue PT for mobility. Currently, only way OOB would be via maximove.   Follow Up Recommendations  SNF     Equipment Recommendations  None recommended by PT    Recommendations for Other Services       Precautions / Restrictions Precautions Precautions: Fall Precaution Comments: incontinent of bowel and bladder; extremely limited L hip flexion at baseline    Mobility  Bed Mobility Overal bed mobility: Needs Assistance Bed Mobility: Rolling;Supine to Sit;Sit to Supine Rolling: Max assist;+2 for safety/equipment;+2 for physical assistance   Supine to sit: Max assist;+2 for safety/equipment;+2 for physical assistance Sit to supine: Max assist;+2 for physical assistance;+2 for safety/equipment   General bed mobility comments: max assist to roll, heavy assist with LEs, able to assist with UB/rail; pt requires excessive time but able to perform supine to sit mostly of his own volition.  Patient close to bed edge so required assist back to bed as  opt. appeared to be sliding.requires  assist with trunk and LEs to return to supine  Transfers                     Ambulation/Gait                 Stairs             Wheelchair Mobility    Modified Rankin (Stroke Patients Only)       Balance Overall balance assessment: Needs assistance Sitting-balance support: Bilateral upper extremity supported;Feet supported Sitting balance-Leahy Scale: Zero Sitting balance - Comments: sever posterior lean, decreased  left hip flexion which impedes sitting.                                      Cognition Arousal/Alertness: Awake/alert Behavior During Therapy: WFL for tasks assessed/performed Overall Cognitive Status: Within Functional Limits for tasks assessed                                 General Comments: appears WFL, slow processing at times      Exercises General Exercises - Lower Extremity Short Arc Quad: AROM;Both;20 reps Heel Slides: AAROM;20 reps;Both;Supine Hip ABduction/ADduction: AAROM;20 reps;Both    General Comments        Pertinent Vitals/Pain Faces Pain Scale: Hurts little more Pain Location: LEs with movement Pain Descriptors / Indicators: Discomfort;Grimacing;Guarding Pain Intervention(s): Monitored during session    Home Living                      Prior Function  PT Goals (current goals can now be found in the care plan section) Progress towards PT goals: Progressing toward goals    Frequency    Min 2X/week      PT Plan Current plan remains appropriate    Co-evaluation              AM-PAC PT "6 Clicks" Mobility   Outcome Measure  Help needed turning from your back to your side while in a flat bed without using bedrails?: Total Help needed moving from lying on your back to sitting on the side of a flat bed without using bedrails?: Total Help needed moving to and from a bed to a chair (including a wheelchair)?: Total Help needed standing up from a chair using your arms (e.g., wheelchair or bedside chair)?: Total Help needed to walk in  hospital room?: Total Help needed climbing 3-5 steps with a railing? : Total 6 Click Score: 6    End of Session   Activity Tolerance: Patient tolerated treatment well Patient left: in bed;with call bell/phone within reach Nurse Communication: Mobility status PT Visit Diagnosis: Unsteadiness on feet (R26.81);Muscle weakness (generalized) (M62.81)     Time: 0938-1829 PT Time Calculation (min) (ACUTE ONLY): 60 min  Charges:  $Therapeutic Exercise: 23-37 mins $Therapeutic Activity: 23-37 mins                     [name] PT Acute Rehabilitation Services Pager [phone] Office [phone]    [name] 02/18/2019, 2:39 PM

## 2019-02-18 NOTE — TOC Initial Note (Signed)
Transition of Care [facility]) - Initial/Assessment Note    Patient Details  Name: [name] MRN: [id number] Date of Birth: [birth year]  [facility]) CM/SW Contact:    Alberteen Sam, [place] Phone Number: [phone] 02/18/2019, 12:24 PM  Clinical Narrative:                  CSW spoke with patient's brother Georgina Quint who called CSW back after multiple attempts. He confirms patient is from [facility] and agrees to plan for patient to discharge back to [facility]. No questions or concerns at this time, CSW informed of anticipated discharge Saturday. [place] appreciative of the update.  Expected Discharge Plan: Skilled Nursing Facility Barriers to Discharge: Continued Medical Work up   Patient Goals and CMS Choice   CMS Medicare.gov Compare Post Acute Care list provided to:: Patient Represenative (must comment)([place] (brother)) Choice offered to / list presented to : Sibling(Turhan)  Expected Discharge Plan and Services Expected Discharge Plan: [place] Acute Care Choice: [place] Living arrangements for the past 2 months: Skilled Nursing Facility([facility])                                      Prior Living Arrangements/Services Living arrangements for the past 2 months: Skilled Nursing Facility([facility]) Lives with:: Self Patient language and need for interpreter reviewed:: Yes Do you feel safe going back to the place where you live?: Yes      Need for Family Participation in Patient Care: Yes (Comment) Care giver support system in place?: Yes (comment)   Criminal Activity/Legal Involvement Pertinent to Current Situation/Hospitalization: No - Comment as needed  Activities of Daily Living Home Assistive Devices/Equipment: Walker (specify type) ADL Screening (condition at time of admission) Patient's cognitive ability adequate to safely complete daily activities?: Yes Is the patient deaf or have difficulty hearing?:  No Does the patient have difficulty seeing, even when wearing glasses/contacts?: No Does the patient have difficulty concentrating, remembering, or making decisions?: No Patient able to express need for assistance with ADLs?: Yes Does the patient have difficulty dressing or bathing?: Yes Independently performs ADLs?: No Communication: Needs assistance Is this a change from baseline?: Pre-admission baseline Does the patient have difficulty walking or climbing stairs?: Yes Weakness of Legs: Both Weakness of Arms/Hands: Both  Permission Sought/Granted Permission sought to share information with : Case Manager, Customer service manager, Family Supports Permission granted to share information with : Yes, Verbal Permission Granted  Share Information with NAME: Georgina Quint  Permission granted to share info w AGENCY: SNFs  Permission granted to share info w Relationship: brother  Permission granted to share info w Contact Information: [phone]  Emotional Assessment Appearance:: Other (Comment Required(unable to assess - remote) Attitude/Demeanor/Rapport: Unable to Assess Affect (typically observed): Unable to Assess Orientation: : Oriented to Self, Oriented to Place, Oriented to Situation Alcohol / Substance Use: Not Applicable Psych Involvement: No (comment)  Admission diagnosis:  Hypoxia [R09.02] 2019 novel coronavirus disease (COVID-19) [U07.1] Patient Active Problem List   Diagnosis Date Noted  . Dyspnea 02/12/2019  . Pain in left wrist 11/03/2017  . Acute diastolic heart failure ([place]) 08/25/2016  . Hypoxia 08/21/2016  . Acute respiratory failure with hypoxia and hypercapnia ([place]) 08/21/2016  . Moderate protein-calorie malnutrition ([place]) 08/21/2016  . Stasis edema of both lower extremities 08/21/2016  . Chronic pain 08/21/2016  . AKI (acute kidney  injury) ([place]) 11/02/2015  . Hyperkalemia 11/02/2015  . Hypoglycemia 11/02/2015  . Cellulitis   . Leg ulcer ([place])   .  Nonhealing ulcer of left lower extremity ([place])   . Pain of left leg   . Cellulitis of leg 04/25/2015  . Degenerative arthritis of hip 04/25/2015  . Cellulitis and abscess of leg   . Microcytic anemia 03/27/2015  . Hypertension   . Fever   . DM (diabetes mellitus), secondary, uncontrolled, with peripheral vascular complications ([place]) 94/76/5465  . MSSA (methicillin susceptible Staphylococcus aureus) infection 03/20/2015  . Leukocytosis 03/20/2015  . Anemia, iron deficiency 03/20/2015  . Cellulitis of left lower extremity   . Pyomyositis    PCP:  Charlsie Merles, MD Pharmacy:   [place], [place] - [address] [place] [address] Phone: [phone] Fax: [phone]     Social Determinants of Health (SDOH) Interventions    Readmission Risk Interventions No flowsheet data found.

## 2019-02-18 NOTE — Progress Notes (Signed)
CSW lvm for patient's brother Travion Ke. Awaiting call back.   [place], [place]

## 2019-02-18 NOTE — Progress Notes (Signed)
Nutrition Follow-up   RD working remotely.  DOCUMENTATION CODES:   Obesity unspecified  INTERVENTION:   Pt receiving Hormel Shake daily with Breakfast which provides 520 kcals and 22 g of protein and Magic cup BID with lunch and dinner, each supplement provides 290 kcal and 9 grams of protein, automatically on meal trays to optimize nutritional intake.   Continue Ensure Enlive po daily, each supplement provides 350 kcal and 20 grams of protein  Continue Juven BID, each packet provides 80 calories, 8 grams of carbohydrate, 2.5  grams of protein (collagen), 7 grams of L-arginine and 7 grams of L-glutamine; supplement contains CaHMB, Vitamins C, E, B12 and Zinc to promote wound healing  Continue 30 ml Prostat TID, each supplement provides 100 kcals and 15 grams protein.   NUTRITION DIAGNOSIS:   Increased nutrient needs related to acute illness, wound healing as evidenced by estimated needs.  Being addressed via supplements  GOAL:   Patient will meet greater than or equal to 90% of their needs  Progressing  MONITOR:   PO intake, Supplement acceptance, Labs, Weight trends, Skin  REASON FOR ASSESSMENT:   Consult Assessment of nutrition requirement/status  ASSESSMENT:   63 y.o. male with medical history significant for DM, HTN, and CHF. He presented to the ED from [place] home with complaints of increased difficulty breathing and low oxygen. On arrival patient's O2 sats were 80% on 6L nasal cannula and he was subsequently placed on nonrebreather with sats increasing to 96%. Patient was diagnosed with COVID-19 at nursing home 2 days PTA.   Oxgen requirements improved this AM, down to 4L after requiring 10 L yesterday  Recorded po intake 50-100% of meals  Pt receiving Juven BID, Pro-Stat 30 mL TID, Ensure Enlive daily. Pt taking Pro-Stat and Juven consistently, Ensure sometimes. Pt also receiving supplements on meal trays  Pt is very deconditioned, chronic  debility; bed to wheelchair for years  Weight trending down, current wt 111.1 kg. Admission weight 121.7 kg.  Net negative 3.5 L since admission per I/O flow sheet however noted umeasured urine occurrences.   Labs: sodium 146 (H), potassium 2.9 (L), no magnesium today, CBGs 122-307 Meds: remeron, ss novolog, lantus, KCl  Diet Order:   Diet Order            Diet heart healthy/carb modified Room service appropriate? Yes; Fluid consistency: Thin  Diet effective now              EDUCATION NEEDS:   Not appropriate for education at this time  Skin:  Skin Assessment: Skin Integrity Issues: Skin Integrity Issues:: Stage II Stage II: sacrum (chronic) Diabetic Ulcer: B/L legs  Last BM:  5/26  Height:   Ht Readings from Last 1 Encounters:  02/12/19 6' (1.829 m)    Weight:   Wt Readings from Last 1 Encounters:  02/18/19 111.1 kg    Ideal Body Weight:  80.9 kg  BMI:  Body mass index is 33.22 kg/m.  Estimated Nutritional Needs:   Kcal:  2376-2831 kcals  Protein:  120-135 grams  Fluid:  >/= 2L    Kerman Passey MS, RD, LDN, CNSC [phone] Pager  [phone] Weekend/On-Call Pager

## 2019-02-18 NOTE — Progress Notes (Signed)
CSW spoke with [name] regarding Saturday morning discharge after patient has last dose of antibiotics. Facility reports absolutely no weekend admits are allowed at their facility at this time for any COVID patients. MD made aware, plan for Monday discharge 6/1.   [place], [place]

## 2019-02-19 LAB — COMPREHENSIVE METABOLIC PANEL WITH GFR
ALT: 17 U/L (ref 0–44)
AST: 27 U/L (ref 15–41)
Albumin: 2.3 g/dL — ABNORMAL LOW (ref 3.5–5.0)
Alkaline Phosphatase: 83 U/L (ref 38–126)
Anion gap: 9 (ref 5–15)
BUN: 17 mg/dL (ref 8–23)
CO2: 34 mmol/L — ABNORMAL HIGH (ref 22–32)
Calcium: 8 mg/dL — ABNORMAL LOW (ref 8.9–10.3)
Chloride: 100 mmol/L (ref 98–111)
Creatinine, Ser: 0.77 mg/dL (ref 0.61–1.24)
GFR calc Af Amer: >60 mL/min
GFR calc non Af Amer: >60 mL/min
Glucose, Bld: 125 mg/dL — ABNORMAL HIGH (ref 70–99)
Potassium: 3.6 mmol/L (ref 3.5–5.1)
Sodium: 143 mmol/L (ref 135–145)
Total Bilirubin: 0.3 mg/dL (ref 0.3–1.2)
Total Protein: 6.9 g/dL (ref 6.5–8.1)

## 2019-02-19 LAB — CBC
HCT: 40.8 % (ref 39.0–52.0)
Hemoglobin: 11.4 g/dL — ABNORMAL LOW (ref 13.0–17.0)
MCH: 18.8 pg — ABNORMAL LOW (ref 26.0–34.0)
MCHC: 27.9 g/dL — ABNORMAL LOW (ref 30.0–36.0)
MCV: 67.3 fL — ABNORMAL LOW (ref 80.0–100.0)
Platelets: 257 10*3/uL (ref 150–400)
RBC: 6.06 MIL/uL — ABNORMAL HIGH (ref 4.22–5.81)
RDW: 24.3 % — ABNORMAL HIGH (ref 11.5–15.5)
WBC: 4.8 10*3/uL (ref 4.0–10.5)
nRBC: 0 % (ref 0.0–0.2)

## 2019-02-19 LAB — GLUCOSE, CAPILLARY
Glucose-Capillary: 157 mg/dL — ABNORMAL HIGH (ref 70–99)
Glucose-Capillary: 123 mg/dL — ABNORMAL HIGH (ref 70–99)
Glucose-Capillary: 122 mg/dL — ABNORMAL HIGH (ref 70–99)
Glucose-Capillary: 146 mg/dL — ABNORMAL HIGH (ref 70–99)

## 2019-02-19 LAB — LACTATE DEHYDROGENASE: LDH: 272 U/L — ABNORMAL HIGH (ref 98–192)

## 2019-02-19 LAB — C-REACTIVE PROTEIN: CRP: 13.1 mg/dL — ABNORMAL HIGH (ref <1.0)

## 2019-02-19 LAB — D-DIMER, QUANTITATIVE: D-Dimer, Quant: 0.79 ug{FEU}/mL — ABNORMAL HIGH (ref 0.00–0.50)

## 2019-02-19 LAB — FERRITIN: Ferritin: 149 ng/mL (ref 24–336)

## 2019-02-19 NOTE — Plan of Care (Signed)
Pt believed that he could stand and walk, but d/t weakness bi and limited ROM in BLE (esp. L side, we were unable to even help the pt to a standing position w/ 2 assist.

## 2019-02-19 NOTE — Progress Notes (Signed)
PROGRESS NOTE                                                                                                                                                                                                             Patient Demographics:    [name], is a 62 y.o. male, DOB - 03-25-1956, KVQ:[id number]  Outpatient Primary MD for the patient is Borum, Jaci Standard, MD    LOS - 7  Chief Complaint  Patient presents with   Shortness of Breath   Fever       Brief Narrative: Patient is a 63 y.o. male with PMHx of phonic lower extremity wounds, chronic diastolic heart failure, chronic hypoxic respiratory failure on home O2, HTN, DM-2-presented with dyspnea-found to have acute hypoxic respiratory failure in the setting of pleural effusion, decompensated diastolic heart failure and was also found to have COVID-19 infection.  Initially improved with diuresis-however acutely worsened with worsening hypoxia-and was started on Remdesivir with significant clinical improvement.   Subjective:  Lying comfortably in bed-oxygen requirements have come down.  Denies any chest pain or shortness of breath   Assessment  & Plan :    Acute on chronic Hypoxic Resp Failure due to decompensated diastolic heart failure and Covid 19 Viral pneumonia: Continues to slowly improve-continue 5-day course of Remdesivir.  No evidence of volume overload-continue to hold Lasix today as well.  CRP slowly improving-and down to 13.1 (may have chronically elevated CRP due to chronic lower extremity wounds).  Continue supportive care-follow volume status closely  COVID-19 Labs:  Recent Labs    02/17/19 0307 02/18/19 0335 02/18/19 0350 02/19/19 0500  DDIMER 0.63*  --  0.70* 0.79*  FERRITIN 195 171  --  149  LDH  --   --  246* 272*  CRP 21.5* 18.6*  --  13.1*    Lab Results  Component Value Date   SARSCOV2NAA POSITIVE (A) 02/12/2019     COVID-19  Medications:  5/26>> Remdesivir  Right-sided pleural effusion: Suspect this is a transudate in the setting of decompensated heart failure.  Due to worsening hypoxia-CT chest does not really show a large enough pleural effusion that needs a thoracocentesis.  Continue supportive care.  Left lower extremity ulceration: No evidence of wound infection on my exam on 5/25-has completed more than 7 days of antimicrobial therapy-no longer Rocephin  and Flagyl.  Continue local wound care.   1 out of 2 blood cultures positive for coag negative staph-likely a contaminant.  Hypokalemia: Secondary to Lasix-repleted.  DM-2: CBG stable with Lantus 20 units and SSI.  Follow.  HTN: Controlled-continue Coreg and lisinopril  Dyslipidemia: Continue statin  Chronic pain syndrome: Stable-continue home regimen of narcotics  Depression: Stable with Cymbalta, Remeron  BPH: Continue Flomax  Chronic debility/deconditioning: Worsened by acute illness-Per patient he is mostly bed to wheelchair bound.  Claims he has not ambulated independently in years.  Morbid obesity    ABG:    Component Value Date/Time   PHART 7.304 (L) 02/12/2019 2050   PCO2ART 66.9 (HH) 02/12/2019 2050   PO2ART 73.1 (L) 02/12/2019 2050   HCO3 32.2 (H) 02/12/2019 2050   TCO2 21 11/02/2015 2039   ACIDBASEDEF 6.0 (H) 11/02/2015 2039   O2SAT 90.2 02/12/2019 2050    Condition -gaurded  Family Communication  : Spoke to patient's brother on 5/27-none at bedside this morning  Code Status :  Full Code  Diet : Heart healthy/diabetic  Disposition Plan  :  Remain inpatient  Consults  : None  Procedures  :  None  DVT Prophylaxis  :  Lovenox  Lab Results  Component Value Date   PLT 257 02/19/2019    Inpatient Medications  Scheduled Meds:  amitriptyline  100 mg Oral QHS   aspirin EC  81 mg Oral BH-q7a   atorvastatin  80 mg Oral Daily   carvedilol  3.125 mg Oral BID WC   DULoxetine  40 mg Oral Daily   enoxaparin  (LOVENOX) injection  60 mg Subcutaneous Q12H   feeding supplement (ENSURE ENLIVE)  237 mL Oral Q24H   feeding supplement (PRO-STAT SUGAR FREE 64)  30 mL Oral TID WC   insulin aspart  0-15 Units Subcutaneous Q4H   insulin glargine  20 Units Subcutaneous Daily   mirtazapine  15 mg Oral QHS   nutrition supplement (JUVEN)  1 packet Oral BID BM   oxyCODONE  10 mg Oral BID   pantoprazole  40 mg Oral Daily   tamsulosin  0.4 mg Oral QPC supper   Continuous Infusions:  sodium chloride 1,000 mL (02/16/19 1331)   remdesivir 100 mg in NS 250 mL 100 mg (02/19/19 1026)   PRN Meds:.sodium chloride, acetaminophen, albuterol, dextrose, guaiFENesin-dextromethorphan, oxyCODONE, polyethylene glycol, polyvinyl alcohol, tiZANidine  Antibiotics  :    Anti-infectives (From admission, onward)   Start     Dose/Rate Route Frequency Ordered Stop   02/17/19 1000  remdesivir 100 mg in sodium chloride 0.9 % 230 mL IVPB     100 mg 500 mL/hr over 30 Minutes Intravenous Every 24 hours 02/16/19 0912 02/21/19 0959   02/16/19 1230  cefTRIAXone (ROCEPHIN) 2 g in sodium chloride 0.9 % 100 mL IVPB  Status:  Discontinued     2 g 200 mL/hr over 30 Minutes Intravenous Every 24 hours 02/16/19 1134 02/17/19 1133   02/16/19 1000  remdesivir 200 mg in sodium chloride 0.9 % 210 mL IVPB     200 mg 500 mL/hr over 30 Minutes Intravenous Once 02/16/19 0912 02/16/19 1037   02/15/19 1230  cefTRIAXone (ROCEPHIN) 1 g in sodium chloride 0.9 % 100 mL IVPB  Status:  Discontinued     1 g 200 mL/hr over 30 Minutes Intravenous Every 24 hours 02/15/19 1207 02/16/19 1134   02/13/19 1000  vancomycin (VANCOCIN) IVPB 1000 mg/200 mL premix  Status:  Discontinued     1,000  mg 200 mL/hr over 60 Minutes Intravenous Every 12 hours 02/12/19 2111 02/15/19 1207   02/13/19 0600  ceFEPIme (MAXIPIME) 2 g in sodium chloride 0.9 % 100 mL IVPB  Status:  Discontinued     2 g 200 mL/hr over 30 Minutes Intravenous Every 8 hours 02/12/19 2111 02/15/19  1207   02/12/19 2115  ceFEPIme (MAXIPIME) 2 g in sodium chloride 0.9 % 100 mL IVPB     2 g 200 mL/hr over 30 Minutes Intravenous  Once 02/12/19 2102 02/12/19 2155   02/12/19 2115  vancomycin (VANCOCIN) 2,500 mg in sodium chloride 0.9 % 500 mL IVPB     2,500 mg 250 mL/hr over 120 Minutes Intravenous  Once 02/12/19 2111 02/13/19 0010   02/12/19 2100  metroNIDAZOLE (FLAGYL) IVPB 500 mg  Status:  Discontinued     500 mg 100 mL/hr over 60 Minutes Intravenous Every 8 hours 02/12/19 2046 02/17/19 1133       Time Spent in minutes  25   Oren Binet M.D on 02/19/2019 at 1:23 PM  To page go to [email or web address] - use universal password  [facility] -  Office  [phone]  Admit date - 02/12/2019    7    Objective:   Vitals:   02/19/19 0500 02/19/19 0600 02/19/19 0735 02/19/19 1120  BP:      Pulse: 84 78    Resp: 19 15    Temp:   97.9 F (36.6 C) 98.1 F (36.7 C)  TempSrc:   Oral Oral  SpO2: 95% 95%    Weight:      Height:        Wt Readings from Last 3 Encounters:  02/19/19 112.1 kg  11/28/17 123.7 kg  11/03/17 120.7 kg     Intake/Output Summary (Last 24 hours) at 02/19/2019 1323 Last data filed at 02/19/2019 1100 Gross per 24 hour  Intake 240 ml  Output 475 ml  Net -235 ml     Physical Exam General appearance:Awake, alert, not in any distress.  Eyes:no scleral icterus. HEENT: Atraumatic and Normocephalic Neck: supple, no JVD. Resp:Good air entry bilaterally,no rales or rhonchi CVS: S1 S2 regular, no murmurs.  GI: Bowel sounds present, Non tender and not distended with no gaurding, rigidity or rebound. Extremities: B/L Lower Ext shows no edema, both legs are warm to touch Neurology:  Non focal Musculoskeletal:No digital cyanosis Skin:No Rash, warm and dry     Data Review:    CBC Recent Labs  Lab 02/13/19 1015 02/14/19 0525 02/15/19 0500 02/16/19 0409 02/17/19 0307 02/18/19 0350 02/19/19 0500  WBC 5.4 4.8 4.1 4.0 4.7 3.9* 4.8  HGB 12.3*  11.6* 11.0* 11.9* 11.3* 11.3* 11.4*  HCT 45.6 40.8 40.5 44.7 40.5 41.8 40.8  PLT 177 148* 140* 150 184 221 257  MCV 68.4* 67.1* 68.1* 68.8* 66.8* 67.7* 67.3*  MCH 18.4* 19.1* 18.5* 18.3* 18.6* 18.3* 18.8*  MCHC 27.0* 28.4* 27.2* 26.6* 27.9* 27.0* 27.9*  RDW 25.2* 24.4* 24.2* 24.8* 23.9* 24.1* 24.3*  LYMPHSABS 1.0 1.0 1.2 1.2 1.2  --   --   MONOABS 0.3 0.3 0.2 0.4 0.4  --   --   EOSABS 0.0 0.0 0.0 0.0 0.0  --   --   BASOSABS 0.0 0.0 0.0 0.0 0.0  --   --     Chemistries  Recent Labs  Lab 02/15/19 0500 02/16/19 0409 02/17/19 0307 02/18/19 0350 02/19/19 0500  NA 144 142 142 146* 143  K 3.4* 3.5 3.5 2.9* 3.6  CL  105 95* 97* 100 100  CO2 29 36* 34* 37* 34*  GLUCOSE 114* 131* 148* 130* 125*  BUN 18 22 30* 23 17  CREATININE 0.87 0.99 0.87 0.89 0.77  CALCIUM 6.9* 8.3* 8.2* 8.0* 8.0*  AST 29 36 30 25 27   ALT 19 21 19 17 17   ALKPHOS 83 101 86 86 83  BILITOT 0.5 0.4 0.2* 0.4 0.3   ------------------------------------------------------------------------------------------------------------------ No results for input(s): CHOL, HDL, LDLCALC, TRIG, CHOLHDL, LDLDIRECT in the last 72 hours.  Lab Results  Component Value Date   HGBA1C 8.0 (H) 02/13/2019   ------------------------------------------------------------------------------------------------------------------ No results for input(s): TSH, T4TOTAL, T3FREE, THYROIDAB in the last 72 hours.  Invalid input(s): FREET3 ------------------------------------------------------------------------------------------------------------------ Recent Labs    02/18/19 0335 02/19/19 0500  FERRITIN 171 149    Coagulation profile No results for input(s): INR, PROTIME in the last 168 hours.  Recent Labs    02/18/19 0350 02/19/19 0500  DDIMER 0.70* 0.79*    Cardiac Enzymes No results for input(s): CKMB, TROPONINI, MYOGLOBIN in the last 168 hours.  Invalid input(s):  CK ------------------------------------------------------------------------------------------------------------------    Component Value Date/Time   BNP 18.4 02/16/2019 0409    Micro Results Recent Results (from the past 240 hour(s))  Blood Culture (routine x 2)     Status: None   Collection Time: 02/12/19  4:24 PM  Result Value Ref Range Status   Specimen Description   Final    BLOOD LEFT FOREARM Performed at [facility], [place] [address] [place], [place] [address]    Special Requests   Final    BOTTLES DRAWN AEROBIC AND ANAEROBIC Blood Culture adequate volume Performed at [place] [address], [place], [place] [address]    Culture   Final    NO GROWTH 5 DAYS Performed at [facility], [place] [address], [place], [place] [address]    Report Status 02/17/2019 FINAL  Final  Blood Culture (routine x 2)     Status: Abnormal   Collection Time: 02/12/19  4:53 PM  Result Value Ref Range Status   Specimen Description   Final    BLOOD RIGHT ANTECUBITAL Performed at [place] [address], [place], [place] [address]    Special Requests   Final    BOTTLES DRAWN AEROBIC AND ANAEROBIC Blood Culture adequate volume Performed at [place] [address], [place], [place] [address]    Culture  Setup Time   Final    GRAM POSITIVE COCCI IN CLUSTERS ANAEROBIC BOTTLE ONLY CRITICAL RESULT CALLED TO, READ BACK BY AND VERIFIED WITH: C. SHADE, PHARMD ([place]) AT 1410 ON 02/13/19 BY C. JESSUP, MLT.    Culture (A)  Final    STAPHYLOCOCCUS SPECIES (COAGULASE NEGATIVE) THE SIGNIFICANCE OF ISOLATING THIS ORGANISM FROM A SINGLE SET OF BLOOD CULTURES WHEN MULTIPLE SETS ARE DRAWN IS UNCERTAIN. PLEASE NOTIFY THE MICROBIOLOGY DEPARTMENT WITHIN ONE WEEK IF SPECIATION AND SENSITIVITIES ARE REQUIRED. Performed at [facility], [place] [address], [place], [place] [address]    Report Status 02/15/2019 FINAL  Final   Blood Culture ID Panel (Reflexed)     Status: Abnormal   Collection Time: 02/12/19  4:53 PM  Result Value Ref Range Status   Enterococcus species NOT DETECTED NOT DETECTED Final   Listeria monocytogenes NOT DETECTED NOT DETECTED Final   Staphylococcus species DETECTED (A) NOT DETECTED Final    Comment: Methicillin (oxacillin) resistant coagulase negative staphylococcus. Possible blood culture contaminant (unless isolated from  more than one blood culture draw or clinical case suggests pathogenicity). No antibiotic treatment is indicated for blood  culture contaminants. CRITICAL RESULT CALLED TO, READ BACK BY AND VERIFIED WITH: C. SHADE, PHARMD ([place]) AT 1410 ON 02/13/19 BY C. JESSUP, MLT.    Staphylococcus aureus (BCID) NOT DETECTED NOT DETECTED Final   Methicillin resistance DETECTED (A) NOT DETECTED Final    Comment: CRITICAL RESULT CALLED TO, READ BACK BY AND VERIFIED WITH: C. SHADE, PHARMD ([place]) AT 1410 ON 02/13/19 BY C. JESSUP, MLT.    Streptococcus species NOT DETECTED NOT DETECTED Final   Streptococcus agalactiae NOT DETECTED NOT DETECTED Final   Streptococcus pneumoniae NOT DETECTED NOT DETECTED Final   Streptococcus pyogenes NOT DETECTED NOT DETECTED Final   Acinetobacter baumannii NOT DETECTED NOT DETECTED Final   Enterobacteriaceae species NOT DETECTED NOT DETECTED Final   Enterobacter cloacae complex NOT DETECTED NOT DETECTED Final   Escherichia coli NOT DETECTED NOT DETECTED Final   Klebsiella oxytoca NOT DETECTED NOT DETECTED Final   Klebsiella pneumoniae NOT DETECTED NOT DETECTED Final   Proteus species NOT DETECTED NOT DETECTED Final   Serratia marcescens NOT DETECTED NOT DETECTED Final   Haemophilus influenzae NOT DETECTED NOT DETECTED Final   Neisseria meningitidis NOT DETECTED NOT DETECTED Final   Pseudomonas aeruginosa NOT DETECTED NOT DETECTED Final   Candida albicans NOT DETECTED NOT DETECTED Final   Candida glabrata NOT DETECTED NOT DETECTED Final   Candida  krusei NOT DETECTED NOT DETECTED Final   Candida parapsilosis NOT DETECTED NOT DETECTED Final   Candida tropicalis NOT DETECTED NOT DETECTED Final    Comment: Performed at [facility], [place] [address] [place], [place] [address]  SARS Coronavirus 2 (CEPHEID- Performed in [place] [facility] lab), Hosp Order     Status: Abnormal   Collection Time: 02/12/19  7:18 PM  Result Value Ref Range Status   SARS Coronavirus 2 POSITIVE (A) NEGATIVE Final    Comment: RESULT CALLED TO, READ BACK BY AND VERIFIED WITH: O.IDOWU AT 2042 ON 02/12/19 BY N.THOMPSON (NOTE) If result is NEGATIVE SARS-CoV-2 target nucleic acids are NOT DETECTED. The SARS-CoV-2 RNA is generally detectable in upper and lower  respiratory specimens during the acute phase of infection. The lowest  concentration of SARS-CoV-2 viral copies this assay can detect is 250  copies / mL. A negative result does not preclude SARS-CoV-2 infection  and should not be used as the sole basis for treatment or other  patient management decisions.  A negative result may occur with  improper specimen collection / handling, submission of specimen other  than nasopharyngeal swab, presence of viral mutation(s) within the  areas targeted by this assay, and inadequate number of viral copies  (<250 copies / mL). A negative result must be combined with clinical  observations, patient history, and epidemiological information. If result is POSITIVE SARS-CoV-2 target nucleic acids are DETECTE D. The SARS-CoV-2 RNA is generally detectable in upper and lower  respiratory specimens during the acute phase of infection.  Positive  results are indicative of active infection with SARS-CoV-2.  Clinical  correlation with patient history and other diagnostic information is  necessary to determine patient infection status.  Positive results do  not rule out bacterial infection or co-infection with other viruses. If result is PRESUMPTIVE POSTIVE SARS-CoV-2  nucleic acids MAY BE PRESENT.   A presumptive positive result was obtained on the submitted specimen  and confirmed on repeat testing.  While 2019 novel coronavirus  (SARS-CoV-2) nucleic acids may  be present in the submitted sample  additional confirmatory testing may be necessary for epidemiological  and / or clinical management purposes  to differentiate between  SARS-CoV-2 and other Sarbecovirus currently known to infect humans.  If clinically indicated additional testing with an alternate test  methodology (LAB745 3) is advised. The SARS-CoV-2 RNA is generally  detectable in upper and lower respiratory specimens during the acute  phase of infection. The expected result is Negative. Fact Sheet for Patients:  StrictlyIdeas.no Fact Sheet for Healthcare Providers: BankingDealers.co.za This test is not yet approved or cleared by the [place] FDA and has been authorized for detection and/or diagnosis of SARS-CoV-2 by FDA under an Emergency Use Authorization (EUA).  This EUA will remain in effect (meaning this test can be used) for the duration of the COVID-19 declaration under Section 564(b)(1) of the Act, 21 U.S.C. section 360bbb-3(b)(1), unless the authorization is terminated or revoked sooner. Performed at [facility], [place] [address] [place], [place] [address]   Culture, Urine     Status: None   Collection Time: 02/12/19  8:40 PM  Result Value Ref Range Status   Specimen Description   Final    Urine Performed at [place] [address], [place], [place] [address]    Special Requests   Final    NONE Performed at [facility], [place] [address] [place], [place] [address]    Culture   Final    NO GROWTH Performed at [facility], [place] [address] [place], [place] [address]    Report Status 02/14/2019 FINAL  Final  MRSA PCR Screening     Status: None    Collection Time: 02/13/19  9:00 AM  Result Value Ref Range Status   MRSA by PCR NEGATIVE NEGATIVE Final    Comment:        The GeneXpert MRSA Assay (FDA approved for NASAL specimens only), is one component of a comprehensive MRSA colonization surveillance program. It is not intended to diagnose MRSA infection nor to guide or monitor treatment for MRSA infections. Performed at [facility], [place] [address] [place], [place] [address]     Radiology Reports Ct Angio Chest Pe W Or Wo Contrast  Result Date: 02/16/2019 CLINICAL DATA:  COVID-19 infection, dyspnea, rule out PE EXAM: CT ANGIOGRAPHY CHEST WITH CONTRAST TECHNIQUE: Multidetector CT imaging of the chest was performed using the standard protocol during bolus administration of intravenous contrast. Multiplanar CT image reconstructions and MIPs were obtained to evaluate the vascular anatomy. CONTRAST:  158mL OMNIPAQUE IOHEXOL 350 MG/ML SOLN COMPARISON:  08/21/2016 FINDINGS: Cardiovascular: Satisfactory opacification of the pulmonary arteries to the segmental level. No evidence of pulmonary embolism. Cardiomegaly. Scattered coronary artery calcifications. Trace pericardial effusion. No pericardial effusion. Mediastinum/Nodes: There are enlarged mediastinal lymph nodes, largest pretracheal nodes measuring 2.1 cm in short axis, increased compared to prior CT. Thyroid gland, trachea, and esophagus demonstrate no significant findings. Lungs/Pleura: There is bilateral, right greater than left heterogeneous and ground-glass opacity with mild interlobular septal thickening. There is chronic atelectasis or scarring and volume loss of the right lung base. Upper Abdomen: No acute abnormality. Musculoskeletal: No chest wall abnormality. No acute or significant osseous findings. Review of the MIP images confirms the above findings. IMPRESSION: 1.  Negative examination for pulmonary embolism. 2. There is bilateral, right greater than left  heterogeneous and ground-glass opacity with mild interlobular septal thickening. There is chronic atelectasis or scarring and volume loss of the right  lung base. Findings are consistent viral infection and/or edema with chronic change at the right lung base. 3. Cardiomegaly, coronary artery disease, and trace pericardial effusion. Electronically Signed   By: Eddie Candle M.D.   On: 02/16/2019 11:26   Dg Chest Port 1 View  Result Date: 02/12/2019 CLINICAL DATA:  Shortness of breath EXAM: PORTABLE CHEST 1 VIEW COMPARISON:  Chest CT 08/21/2016 FINDINGS: Large right pleural effusion. Right basilar atelectasis. Cardiomediastinal contours are normal. No acute osseous abnormality. IMPRESSION: Large right pleural effusion with right basilar atelectasis. Electronically Signed   By: Ulyses Jarred M.D.   On: 02/12/2019 18:12   Dg Chest Port 1v Same Day  Result Date: 02/16/2019 CLINICAL DATA:  Shortness of breath. EXAM: PORTABLE CHEST 1 VIEW COMPARISON:  Radiograph of Feb 12, 2019. FINDINGS: Stable cardiomediastinal silhouette. No pneumothorax is noted. Moderate size right pleural effusion is noted with associated atelectasis or edema. Mild left basilar subsegmental atelectasis or edema is noted. Minimal left pleural effusion is noted. Bony thorax is unremarkable. IMPRESSION: Moderate right pleural effusion is noted with associated atelectasis or edema. Mild left basilar subsegmental atelectasis or edema is noted. Electronically Signed   By: Marijo Conception M.D.   On: 02/16/2019 08:09

## 2019-02-20 LAB — COMPREHENSIVE METABOLIC PANEL
ALT: 19 U/L (ref 0–44)
AST: 31 U/L (ref 15–41)
Albumin: 2.2 g/dL — ABNORMAL LOW (ref 3.5–5.0)
Alkaline Phosphatase: 79 U/L (ref 38–126)
Anion gap: 7 (ref 5–15)
BUN: 12 mg/dL (ref 8–23)
CO2: 36 mmol/L — ABNORMAL HIGH (ref 22–32)
Calcium: 7.9 mg/dL — ABNORMAL LOW (ref 8.9–10.3)
Chloride: 99 mmol/L (ref 98–111)
Creatinine, Ser: 0.82 mg/dL (ref 0.61–1.24)
GFR calc Af Amer: >60 mL/min (ref >60)
GFR calc non Af Amer: >60 mL/min (ref >60)
Glucose, Bld: 162 mg/dL — ABNORMAL HIGH (ref 70–99)
Potassium: 3.4 mmol/L — ABNORMAL LOW (ref 3.5–5.1)
Sodium: 142 mmol/L (ref 135–145)
Total Bilirubin: 0.3 mg/dL (ref 0.3–1.2)
Total Protein: 6.7 g/dL (ref 6.5–8.1)

## 2019-02-20 LAB — CBC
HCT: 40 % (ref 39.0–52.0)
Hemoglobin: 10.6 g/dL — ABNORMAL LOW (ref 13.0–17.0)
MCH: 18.1 pg — ABNORMAL LOW (ref 26.0–34.0)
MCHC: 26.5 g/dL — ABNORMAL LOW (ref 30.0–36.0)
MCV: 68.1 fL — ABNORMAL LOW (ref 80.0–100.0)
Platelets: 278 10*3/uL (ref 150–400)
RBC: 5.87 MIL/uL — ABNORMAL HIGH (ref 4.22–5.81)
RDW: 24 % — ABNORMAL HIGH (ref 11.5–15.5)
WBC: 4.3 10*3/uL (ref 4.0–10.5)
nRBC: 0 % (ref 0.0–0.2)

## 2019-02-20 LAB — GLUCOSE, CAPILLARY
Glucose-Capillary: 133 mg/dL — ABNORMAL HIGH (ref 70–99)
Glucose-Capillary: 179 mg/dL — ABNORMAL HIGH (ref 70–99)
Glucose-Capillary: 180 mg/dL — ABNORMAL HIGH (ref 70–99)
Glucose-Capillary: 183 mg/dL — ABNORMAL HIGH (ref 70–99)
Glucose-Capillary: 183 mg/dL — ABNORMAL HIGH (ref 70–99)
Glucose-Capillary: 196 mg/dL — ABNORMAL HIGH (ref 70–99)
Glucose-Capillary: 211 mg/dL — ABNORMAL HIGH (ref 70–99)

## 2019-02-20 LAB — C-REACTIVE PROTEIN: CRP: 8.7 mg/dL — ABNORMAL HIGH (ref <1.0)

## 2019-02-20 LAB — FERRITIN: Ferritin: 131 ng/mL (ref 24–336)

## 2019-02-20 LAB — LACTATE DEHYDROGENASE: LDH: 238 U/L — ABNORMAL HIGH (ref 98–192)

## 2019-02-20 LAB — D-DIMER, QUANTITATIVE: D-Dimer, Quant: 0.51 ug/mL-FEU — ABNORMAL HIGH (ref 0.00–0.50)

## 2019-02-20 MED ORDER — TORSEMIDE 20 MG PO TABS
20.0000 mg | ORAL_TABLET | Freq: Every day | ORAL | Status: DC
  Administered 2019-02-20 – 2019-02-22 (×3): 20 mg via ORAL
  Filled 2019-02-20 (×4): qty 1

## 2019-02-20 NOTE — Progress Notes (Addendum)
0816  Resting in bed with eyes closed.  Awakens easily.  Resp e/u.  O2 sat 95% on 6L/[place]  1730  Dressing to LLE changed.  Wound beds pink with serosanguinous/purulent drainage.  Foul odor noted.   Cleansed with saline and saline gauze applied to wound bed.  Dry gauze applied and kerlix.

## 2019-02-20 NOTE — Progress Notes (Signed)
PROGRESS NOTE                                                                                                                                                                                                             Patient Demographics:    [name], is a 63 y.o. male, DOB - September 20, 1956, TGG:[id number]  Outpatient Primary MD for the patient is Borum, Jaci Standard, MD    LOS - 8  Chief Complaint  Patient presents with   Shortness of Breath   Fever       Brief Narrative: Patient is a 63 y.o. male with PMHx of phonic lower extremity wounds, chronic diastolic heart failure, chronic hypoxic respiratory failure on home O2, HTN, DM-2-presented with dyspnea-found to have acute hypoxic respiratory failure in the setting of pleural effusion, decompensated diastolic heart failure and was also found to have COVID-19 infection.  Initially improved with diuresis-however acutely worsened with worsening hypoxia-and was started on Remdesivir with significant clinical improvement.   Subjective:  Lying comfortably in bed-no major issues overnight.   Assessment  & Plan :    Acute on chronic Hypoxic Resp Failure due to decompensated diastolic heart failure and Covid 19 Viral pneumonia: Appears stable-on 4-6 L of oxygen-breathing comfortably.  Will finish a 5-day course of Remdesivir on 5/30.  Inflammatory markers continues to downtrend-however suspect may have some amount of chronically elevated CRP due to chronic lower extremity wound.  Had held IV Lasix for the past few days-but will start oral Demadex.  COVID-19 Labs:  Recent Labs    02/18/19 0335 02/18/19 0350 02/19/19 0500 02/20/19 0359  DDIMER  --  0.70* 0.79* 0.51*  FERRITIN 171  --  149 131  LDH  --  246* 272* 238*  CRP 18.6*  --  13.1* 8.7*    Lab Results  Component Value Date   SARSCOV2NAA POSITIVE (A) 02/12/2019     COVID-19 Medications:  5/26>>  Remdesivir  Right-sided pleural effusion: Suspect this is a transudate in the setting of decompensated heart failure.  Due to worsening hypoxia-CT chest does not really show a large enough pleural effusion that needs a thoracocentesis.  Continue supportive care.  Left lower extremity ulceration: No evidence of wound infection on my exam on 5/25-has completed more than 7 days of antimicrobial therapy-no longer Rocephin and Flagyl.  Continue local  wound care.   1 out of 2 blood cultures positive for coag negative staph-likely a contaminant.  Hypokalemia: Secondary to Lasix-replete and recheck  DM-2: CBG stable with Lantus 20 units and SSI.  Follow.  HTN: Controlled-continue Coreg and lisinopril  Dyslipidemia: Continue statin  Chronic pain syndrome: Stable-continue home regimen of narcotics  Depression: Stable with Cymbalta, Remeron  BPH: Continue Flomax  Chronic debility/deconditioning: Worsened by acute illness-Per patient he is mostly bed to wheelchair bound.  Claims he has not ambulated independently in years.  Morbid obesity    ABG:    Component Value Date/Time   PHART 7.304 (L) 02/12/2019 2050   PCO2ART 66.9 (HH) 02/12/2019 2050   PO2ART 73.1 (L) 02/12/2019 2050   HCO3 32.2 (H) 02/12/2019 2050   TCO2 21 11/02/2015 2039   ACIDBASEDEF 6.0 (H) 11/02/2015 2039   O2SAT 90.2 02/12/2019 2050    Condition -gaurded  Family Communication  : Spoke to patient's brother on 5/27-none at bedside this morning  Code Status :  Full Code  Diet : Heart healthy/diabetic  Disposition Plan  :  Remain inpatient  Consults  : None  Procedures  :  None  DVT Prophylaxis  :  Lovenox  Lab Results  Component Value Date   PLT 278 02/20/2019    Inpatient Medications  Scheduled Meds:  amitriptyline  100 mg Oral QHS   aspirin EC  81 mg Oral BH-q7a   atorvastatin  80 mg Oral Daily   carvedilol  3.125 mg Oral BID WC   DULoxetine  40 mg Oral Daily   enoxaparin (LOVENOX) injection   60 mg Subcutaneous Q12H   feeding supplement (ENSURE ENLIVE)  237 mL Oral Q24H   feeding supplement (PRO-STAT SUGAR FREE 64)  30 mL Oral TID WC   insulin aspart  0-15 Units Subcutaneous Q4H   insulin glargine  20 Units Subcutaneous Daily   mirtazapine  15 mg Oral QHS   nutrition supplement (JUVEN)  1 packet Oral BID BM   oxyCODONE  10 mg Oral BID   pantoprazole  40 mg Oral Daily   tamsulosin  0.4 mg Oral QPC supper   torsemide  20 mg Oral Daily   Continuous Infusions:  sodium chloride 250 mL (02/20/19 1138)   PRN Meds:.sodium chloride, acetaminophen, albuterol, dextrose, guaiFENesin-dextromethorphan, oxyCODONE, polyethylene glycol, polyvinyl alcohol, tiZANidine  Antibiotics  :    Anti-infectives (From admission, onward)   Start     Dose/Rate Route Frequency Ordered Stop   02/17/19 1000  remdesivir 100 mg in sodium chloride 0.9 % 230 mL IVPB     100 mg 500 mL/hr over 30 Minutes Intravenous Every 24 hours 02/16/19 0912 02/20/19 1205   02/16/19 1230  cefTRIAXone (ROCEPHIN) 2 g in sodium chloride 0.9 % 100 mL IVPB  Status:  Discontinued     2 g 200 mL/hr over 30 Minutes Intravenous Every 24 hours 02/16/19 1134 02/17/19 1133   02/16/19 1000  remdesivir 200 mg in sodium chloride 0.9 % 210 mL IVPB     200 mg 500 mL/hr over 30 Minutes Intravenous Once 02/16/19 0912 02/16/19 1037   02/15/19 1230  cefTRIAXone (ROCEPHIN) 1 g in sodium chloride 0.9 % 100 mL IVPB  Status:  Discontinued     1 g 200 mL/hr over 30 Minutes Intravenous Every 24 hours 02/15/19 1207 02/16/19 1134   02/13/19 1000  vancomycin (VANCOCIN) IVPB 1000 mg/200 mL premix  Status:  Discontinued     1,000 mg 200 mL/hr over 60 Minutes Intravenous Every  12 hours 02/12/19 2111 02/15/19 1207   02/13/19 0600  ceFEPIme (MAXIPIME) 2 g in sodium chloride 0.9 % 100 mL IVPB  Status:  Discontinued     2 g 200 mL/hr over 30 Minutes Intravenous Every 8 hours 02/12/19 2111 02/15/19 1207   02/12/19 2115  ceFEPIme (MAXIPIME) 2 g  in sodium chloride 0.9 % 100 mL IVPB     2 g 200 mL/hr over 30 Minutes Intravenous  Once 02/12/19 2102 02/12/19 2155   02/12/19 2115  vancomycin (VANCOCIN) 2,500 mg in sodium chloride 0.9 % 500 mL IVPB     2,500 mg 250 mL/hr over 120 Minutes Intravenous  Once 02/12/19 2111 02/13/19 0010   02/12/19 2100  metroNIDAZOLE (FLAGYL) IVPB 500 mg  Status:  Discontinued     500 mg 100 mL/hr over 60 Minutes Intravenous Every 8 hours 02/12/19 2046 02/17/19 1133       Time Spent in minutes  25   Oren Binet M.D on 02/20/2019 at 1:40 PM  To page go to [email or web address] - use universal password  [facility] -  Office  [phone]  Admit date - 02/12/2019    8    Objective:   Vitals:   02/20/19 0500 02/20/19 0645 02/20/19 0743 02/20/19 0900  BP:    118/63  Pulse:  72  74  Resp:  (!) 0  18  Temp:   97.9 F (36.6 C)   TempSrc:   Oral   SpO2:  96%  95%  Weight: 112.1 kg     Height:        Wt Readings from Last 3 Encounters:  02/20/19 112.1 kg  11/28/17 123.7 kg  11/03/17 120.7 kg     Intake/Output Summary (Last 24 hours) at 02/20/2019 1340 Last data filed at 02/19/2019 1900 Gross per 24 hour  Intake 592.74 ml  Output --  Net 592.74 ml     Physical Exam General appearance:Awake, alert, not in any distress.  Eyes:no scleral icterus. HEENT: Atraumatic and Normocephalic Neck: supple, no JVD. Resp:Good air entry bilaterally,no rales or rhonchi Sterba anteriorly CVS: S1 S2 regular GI: Bowel sounds present, Non tender and not distended with no gaurding, rigidity or rebound. Extremities: B/L Lower Ext shows no edema, both legs are warm to touch Neurology:  Non focal Musculoskeletal:No digital cyanosis Skin:No Rash, warm and dry Wounds:N/A    Data Review:    CBC Recent Labs  Lab 02/14/19 0525 02/15/19 0500 02/16/19 0409 02/17/19 0307 02/18/19 0350 02/19/19 0500 02/20/19 0359  WBC 4.8 4.1 4.0 4.7 3.9* 4.8 4.3  HGB 11.6* 11.0* 11.9* 11.3* 11.3* 11.4* 10.6*   HCT 40.8 40.5 44.7 40.5 41.8 40.8 40.0  PLT 148* 140* 150 184 221 257 278  MCV 67.1* 68.1* 68.8* 66.8* 67.7* 67.3* 68.1*  MCH 19.1* 18.5* 18.3* 18.6* 18.3* 18.8* 18.1*  MCHC 28.4* 27.2* 26.6* 27.9* 27.0* 27.9* 26.5*  RDW 24.4* 24.2* 24.8* 23.9* 24.1* 24.3* 24.0*  LYMPHSABS 1.0 1.2 1.2 1.2  --   --   --   MONOABS 0.3 0.2 0.4 0.4  --   --   --   EOSABS 0.0 0.0 0.0 0.0  --   --   --   BASOSABS 0.0 0.0 0.0 0.0  --   --   --     Chemistries  Recent Labs  Lab 02/16/19 0409 02/17/19 0307 02/18/19 0350 02/19/19 0500 02/20/19 0359  NA 142 142 146* 143 142  K 3.5 3.5 2.9* 3.6 3.4*  CL 95* 97* 100  100 99  CO2 36* 34* 37* 34* 36*  GLUCOSE 131* 148* 130* 125* 162*  BUN 22 30* 23 17 12   CREATININE 0.99 0.87 0.89 0.77 0.82  CALCIUM 8.3* 8.2* 8.0* 8.0* 7.9*  AST 36 30 25 27 31   ALT 21 19 17 17 19   ALKPHOS 101 86 86 83 79  BILITOT 0.4 0.2* 0.4 0.3 0.3   ------------------------------------------------------------------------------------------------------------------ No results for input(s): CHOL, HDL, LDLCALC, TRIG, CHOLHDL, LDLDIRECT in the last 72 hours.  Lab Results  Component Value Date   HGBA1C 8.0 (H) 02/13/2019   ------------------------------------------------------------------------------------------------------------------ No results for input(s): TSH, T4TOTAL, T3FREE, THYROIDAB in the last 72 hours.  Invalid input(s): FREET3 ------------------------------------------------------------------------------------------------------------------ Recent Labs    02/19/19 0500 02/20/19 0359  FERRITIN 149 131    Coagulation profile No results for input(s): INR, PROTIME in the last 168 hours.  Recent Labs    02/19/19 0500 02/20/19 0359  DDIMER 0.79* 0.51*    Cardiac Enzymes No results for input(s): CKMB, TROPONINI, MYOGLOBIN in the last 168 hours.  Invalid input(s):  CK ------------------------------------------------------------------------------------------------------------------    Component Value Date/Time   BNP 18.4 02/16/2019 0409    Micro Results Recent Results (from the past 240 hour(s))  Blood Culture (routine x 2)     Status: None   Collection Time: 02/12/19  4:24 PM  Result Value Ref Range Status   Specimen Description   Final    BLOOD LEFT FOREARM Performed at [facility], [place] [address] [place], [place] [address]    Special Requests   Final    BOTTLES DRAWN AEROBIC AND ANAEROBIC Blood Culture adequate volume Performed at [place] [address] [place], [place] [address]    Culture   Final    NO GROWTH 5 DAYS Performed at [facility], [place] [address], [place], [place] [address]    Report Status 02/17/2019 FINAL  Final  Blood Culture (routine x 2)     Status: Abnormal   Collection Time: 02/12/19  4:53 PM  Result Value Ref Range Status   Specimen Description   Final    BLOOD RIGHT ANTECUBITAL Performed at [place] [address] [place], [place] [address]    Special Requests   Final    BOTTLES DRAWN AEROBIC AND ANAEROBIC Blood Culture adequate volume Performed at [place] [address], [place], [place] [address]    Culture  Setup Time   Final    GRAM POSITIVE COCCI IN CLUSTERS ANAEROBIC BOTTLE ONLY CRITICAL RESULT CALLED TO, READ BACK BY AND VERIFIED WITH: C. SHADE, PHARMD ([place]) AT 1410 ON 02/13/19 BY C. JESSUP, MLT.    Culture (A)  Final    STAPHYLOCOCCUS SPECIES (COAGULASE NEGATIVE) THE SIGNIFICANCE OF ISOLATING THIS ORGANISM FROM A SINGLE SET OF BLOOD CULTURES WHEN MULTIPLE SETS ARE DRAWN IS UNCERTAIN. PLEASE NOTIFY THE MICROBIOLOGY DEPARTMENT WITHIN ONE WEEK IF SPECIATION AND SENSITIVITIES ARE REQUIRED. Performed at [facility], [place] [address], [place], [place] [address]    Report Status 02/15/2019 FINAL  Final   Blood Culture ID Panel (Reflexed)     Status: Abnormal   Collection Time: 02/12/19  4:53 PM  Result Value Ref Range Status   Enterococcus species NOT DETECTED NOT DETECTED Final   Listeria monocytogenes NOT DETECTED NOT DETECTED Final   Staphylococcus species DETECTED (A) NOT DETECTED Final    Comment: Methicillin (oxacillin) resistant coagulase negative staphylococcus. Possible blood culture contaminant (unless isolated from more than one  blood culture draw or clinical case suggests pathogenicity). No antibiotic treatment is indicated for blood  culture contaminants. CRITICAL RESULT CALLED TO, READ BACK BY AND VERIFIED WITH: C. SHADE, PHARMD ([place]) AT 1410 ON 02/13/19 BY C. JESSUP, MLT.    Staphylococcus aureus (BCID) NOT DETECTED NOT DETECTED Final   Methicillin resistance DETECTED (A) NOT DETECTED Final    Comment: CRITICAL RESULT CALLED TO, READ BACK BY AND VERIFIED WITH: C. SHADE, PHARMD ([place]) AT 1410 ON 02/13/19 BY C. JESSUP, MLT.    Streptococcus species NOT DETECTED NOT DETECTED Final   Streptococcus agalactiae NOT DETECTED NOT DETECTED Final   Streptococcus pneumoniae NOT DETECTED NOT DETECTED Final   Streptococcus pyogenes NOT DETECTED NOT DETECTED Final   Acinetobacter baumannii NOT DETECTED NOT DETECTED Final   Enterobacteriaceae species NOT DETECTED NOT DETECTED Final   Enterobacter cloacae complex NOT DETECTED NOT DETECTED Final   Escherichia coli NOT DETECTED NOT DETECTED Final   Klebsiella oxytoca NOT DETECTED NOT DETECTED Final   Klebsiella pneumoniae NOT DETECTED NOT DETECTED Final   Proteus species NOT DETECTED NOT DETECTED Final   Serratia marcescens NOT DETECTED NOT DETECTED Final   Haemophilus influenzae NOT DETECTED NOT DETECTED Final   Neisseria meningitidis NOT DETECTED NOT DETECTED Final   Pseudomonas aeruginosa NOT DETECTED NOT DETECTED Final   Candida albicans NOT DETECTED NOT DETECTED Final   Candida glabrata NOT DETECTED NOT DETECTED Final   Candida  krusei NOT DETECTED NOT DETECTED Final   Candida parapsilosis NOT DETECTED NOT DETECTED Final   Candida tropicalis NOT DETECTED NOT DETECTED Final    Comment: Performed at [facility], [place] [address], [place], [place] [address]  SARS Coronavirus 2 (CEPHEID- Performed in [facility] hospital lab), Hosp Order     Status: Abnormal   Collection Time: 02/12/19  7:18 PM  Result Value Ref Range Status   SARS Coronavirus 2 POSITIVE (A) NEGATIVE Final    Comment: RESULT CALLED TO, READ BACK BY AND VERIFIED WITH: O.IDOWU AT 2042 ON 02/12/19 BY N.THOMPSON (NOTE) If result is NEGATIVE SARS-CoV-2 target nucleic acids are NOT DETECTED. The SARS-CoV-2 RNA is generally detectable in upper and lower  respiratory specimens during the acute phase of infection. The lowest  concentration of SARS-CoV-2 viral copies this assay can detect is 250  copies / mL. A negative result does not preclude SARS-CoV-2 infection  and should not be used as the sole basis for treatment or other  patient management decisions.  A negative result may occur with  improper specimen collection / handling, submission of specimen other  than nasopharyngeal swab, presence of viral mutation(s) within the  areas targeted by this assay, and inadequate number of viral copies  (<250 copies / mL). A negative result must be combined with clinical  observations, patient history, and epidemiological information. If result is POSITIVE SARS-CoV-2 target nucleic acids are DETECTE D. The SARS-CoV-2 RNA is generally detectable in upper and lower  respiratory specimens during the acute phase of infection.  Positive  results are indicative of active infection with SARS-CoV-2.  Clinical  correlation with patient history and other diagnostic information is  necessary to determine patient infection status.  Positive results do  not rule out bacterial infection or co-infection with other viruses. If result is PRESUMPTIVE POSTIVE SARS-CoV-2  nucleic acids MAY BE PRESENT.   A presumptive positive result was obtained on the submitted specimen  and confirmed on repeat testing.  While 2019 novel coronavirus  (SARS-CoV-2) nucleic acids may be present in  the submitted sample  additional confirmatory testing may be necessary for epidemiological  and / or clinical management purposes  to differentiate between  SARS-CoV-2 and other Sarbecovirus currently known to infect humans.  If clinically indicated additional testing with an alternate test  methodology (LAB745 3) is advised. The SARS-CoV-2 RNA is generally  detectable in upper and lower respiratory specimens during the acute  phase of infection. The expected result is Negative. Fact Sheet for Patients:  StrictlyIdeas.no Fact Sheet for Healthcare Providers: BankingDealers.co.za This test is not yet approved or cleared by the [place] FDA and has been authorized for detection and/or diagnosis of SARS-CoV-2 by FDA under an Emergency Use Authorization (EUA).  This EUA will remain in effect (meaning this test can be used) for the duration of the COVID-19 declaration under Section 564(b)(1) of the Act, 21 U.S.C. section 360bbb-3(b)(1), unless the authorization is terminated or revoked sooner. Performed at [facility], [place] [address], [place], [place] [address]   Culture, Urine     Status: None   Collection Time: 02/12/19  8:40 PM  Result Value Ref Range Status   Specimen Description   Final    Urine Performed at [place] [address], [place], [place] [address]    Special Requests   Final    NONE Performed at [facility], [place] [address], [place], [place] [address]    Culture   Final    NO GROWTH Performed at [facility], [place] [address], [place], [place] [address]    Report Status 02/14/2019 FINAL  Final  MRSA PCR Screening     Status: None    Collection Time: 02/13/19  9:00 AM  Result Value Ref Range Status   MRSA by PCR NEGATIVE NEGATIVE Final    Comment:        The GeneXpert MRSA Assay (FDA approved for NASAL specimens only), is one component of a comprehensive MRSA colonization surveillance program. It is not intended to diagnose MRSA infection nor to guide or monitor treatment for MRSA infections. Performed at [facility], [place] [address], [place], [place] [address]     Radiology Reports Ct Angio Chest Pe W Or Wo Contrast  Result Date: 02/16/2019 CLINICAL DATA:  COVID-19 infection, dyspnea, rule out PE EXAM: CT ANGIOGRAPHY CHEST WITH CONTRAST TECHNIQUE: Multidetector CT imaging of the chest was performed using the standard protocol during bolus administration of intravenous contrast. Multiplanar CT image reconstructions and MIPs were obtained to evaluate the vascular anatomy. CONTRAST:  146mL OMNIPAQUE IOHEXOL 350 MG/ML SOLN COMPARISON:  08/21/2016 FINDINGS: Cardiovascular: Satisfactory opacification of the pulmonary arteries to the segmental level. No evidence of pulmonary embolism. Cardiomegaly. Scattered coronary artery calcifications. Trace pericardial effusion. No pericardial effusion. Mediastinum/Nodes: There are enlarged mediastinal lymph nodes, largest pretracheal nodes measuring 2.1 cm in short axis, increased compared to prior CT. Thyroid gland, trachea, and esophagus demonstrate no significant findings. Lungs/Pleura: There is bilateral, right greater than left heterogeneous and ground-glass opacity with mild interlobular septal thickening. There is chronic atelectasis or scarring and volume loss of the right lung base. Upper Abdomen: No acute abnormality. Musculoskeletal: No chest wall abnormality. No acute or significant osseous findings. Review of the MIP images confirms the above findings. IMPRESSION: 1.  Negative examination for pulmonary embolism. 2. There is bilateral, right greater than left  heterogeneous and ground-glass opacity with mild interlobular septal thickening. There is chronic atelectasis or scarring and volume loss of the right lung base. Findings  are consistent viral infection and/or edema with chronic change at the right lung base. 3. Cardiomegaly, coronary artery disease, and trace pericardial effusion. Electronically Signed   By: Eddie Candle M.D.   On: 02/16/2019 11:26   Dg Chest Port 1 View  Result Date: 02/12/2019 CLINICAL DATA:  Shortness of breath EXAM: PORTABLE CHEST 1 VIEW COMPARISON:  Chest CT 08/21/2016 FINDINGS: Large right pleural effusion. Right basilar atelectasis. Cardiomediastinal contours are normal. No acute osseous abnormality. IMPRESSION: Large right pleural effusion with right basilar atelectasis. Electronically Signed   By: Ulyses Jarred M.D.   On: 02/12/2019 18:12   Dg Chest Port 1v Same Day  Result Date: 02/16/2019 CLINICAL DATA:  Shortness of breath. EXAM: PORTABLE CHEST 1 VIEW COMPARISON:  Radiograph of Feb 12, 2019. FINDINGS: Stable cardiomediastinal silhouette. No pneumothorax is noted. Moderate size right pleural effusion is noted with associated atelectasis or edema. Mild left basilar subsegmental atelectasis or edema is noted. Minimal left pleural effusion is noted. Bony thorax is unremarkable. IMPRESSION: Moderate right pleural effusion is noted with associated atelectasis or edema. Mild left basilar subsegmental atelectasis or edema is noted. Electronically Signed   By: Marijo Conception M.D.   On: 02/16/2019 08:09

## 2019-02-20 NOTE — Plan of Care (Signed)
  Problem: Education: Goal: Knowledge of risk factors and measures for prevention of condition will improve Outcome: Progressing   Problem: Respiratory: Goal: Will maintain a patent airway Outcome: Progressing Goal: Complications related to the disease process, condition or treatment will be avoided or minimized Outcome: Progressing   

## 2019-02-21 LAB — COMPREHENSIVE METABOLIC PANEL
ALT: 23 U/L (ref 0–44)
AST: 36 U/L (ref 15–41)
Albumin: 2.4 g/dL — ABNORMAL LOW (ref 3.5–5.0)
Alkaline Phosphatase: 84 U/L (ref 38–126)
Anion gap: 10 (ref 5–15)
BUN: 13 mg/dL (ref 8–23)
CO2: 37 mmol/L — ABNORMAL HIGH (ref 22–32)
Calcium: 8.3 mg/dL — ABNORMAL LOW (ref 8.9–10.3)
Chloride: 96 mmol/L — ABNORMAL LOW (ref 98–111)
Creatinine, Ser: 0.91 mg/dL (ref 0.61–1.24)
GFR calc Af Amer: >60 mL/min (ref >60)
GFR calc non Af Amer: >60 mL/min (ref >60)
Glucose, Bld: 124 mg/dL — ABNORMAL HIGH (ref 70–99)
Potassium: 3.8 mmol/L (ref 3.5–5.1)
Sodium: 143 mmol/L (ref 135–145)
Total Bilirubin: 0.3 mg/dL (ref 0.3–1.2)
Total Protein: 7.3 g/dL (ref 6.5–8.1)

## 2019-02-21 LAB — CBC
HCT: 42.3 % (ref 39.0–52.0)
Hemoglobin: 11.7 g/dL — ABNORMAL LOW (ref 13.0–17.0)
MCH: 18.6 pg — ABNORMAL LOW (ref 26.0–34.0)
MCHC: 27.7 g/dL — ABNORMAL LOW (ref 30.0–36.0)
MCV: 67.4 fL — ABNORMAL LOW (ref 80.0–100.0)
Platelets: 334 10*3/uL (ref 150–400)
RBC: 6.28 MIL/uL — ABNORMAL HIGH (ref 4.22–5.81)
RDW: 24.4 % — ABNORMAL HIGH (ref 11.5–15.5)
WBC: 4.9 10*3/uL (ref 4.0–10.5)
nRBC: 0 % (ref 0.0–0.2)

## 2019-02-21 LAB — GLUCOSE, CAPILLARY
Glucose-Capillary: 127 mg/dL — ABNORMAL HIGH (ref 70–99)
Glucose-Capillary: 162 mg/dL — ABNORMAL HIGH (ref 70–99)
Glucose-Capillary: 244 mg/dL — ABNORMAL HIGH (ref 70–99)
Glucose-Capillary: 245 mg/dL — ABNORMAL HIGH (ref 70–99)
Glucose-Capillary: 178 mg/dL — ABNORMAL HIGH (ref 70–99)
Glucose-Capillary: 212 mg/dL — ABNORMAL HIGH (ref 70–99)

## 2019-02-21 LAB — LACTATE DEHYDROGENASE: LDH: 261 U/L — ABNORMAL HIGH (ref 98–192)

## 2019-02-21 LAB — D-DIMER, QUANTITATIVE: D-Dimer, Quant: 0.4 ug/mL-FEU (ref 0.00–0.50)

## 2019-02-21 LAB — FERRITIN: Ferritin: 126 ng/mL (ref 24–336)

## 2019-02-21 LAB — C-REACTIVE PROTEIN: CRP: 6.7 mg/dL — ABNORMAL HIGH (ref <1.0)

## 2019-02-21 NOTE — Plan of Care (Signed)
  Problem: Education: Goal: Knowledge of risk factors and measures for prevention of condition will improve Outcome: Progressing   Problem: Respiratory: Goal: Will maintain a patent airway Outcome: Progressing Goal: Complications related to the disease process, condition or treatment will be avoided or minimized Outcome: Progressing   

## 2019-02-21 NOTE — Progress Notes (Addendum)
A/Ox4. LLE 7/10 pain. Educated the importance usage of incentive and OOB to chair.

## 2019-02-21 NOTE — Progress Notes (Signed)
PROGRESS NOTE                                                                                                                                                                                                             Patient Demographics:    [name], is a 63 y.o. male, DOB - 26-Feb-1956, NLZ:[id number]  Outpatient Primary MD for the patient is Borum, Jaci Standard, MD    LOS - 9  Chief Complaint  Patient presents with   Shortness of Breath   Fever       Brief Narrative: Patient is a 63 y.o. male with PMHx of phonic lower extremity wounds, chronic diastolic heart failure, chronic hypoxic respiratory failure on home O2, HTN, DM-2-presented with dyspnea-found to have acute hypoxic respiratory failure in the setting of pleural effusion, decompensated diastolic heart failure and was also found to have COVID-19 infection.  Initially improved with diuresis-however acutely worsened with worsening hypoxia-and was started on Remdesivir with significant clinical improvement.   Subjective:  No major issues overnight-lying comfortably.   Assessment  & Plan :    Acute on chronic Hypoxic Resp Failure due to decompensated diastolic heart failure and Covid 19 Viral pneumonia: Remains stable-on 4 L of oxygen this morning.  Has completed a 5-day course of Remdesivir on 5/30.  Inflammatory markers including CRP continue to downtrend-could have some mild elevated CRP chronically due to lower extremity wounds.  Initially on IV Lasix-but due to improvement in volume status-transition to oral diuretics.  COVID-19 Labs:  Recent Labs    02/19/19 0500 02/20/19 0359 02/21/19 0400  DDIMER 0.79* 0.51* 0.40  FERRITIN 149 131 126  LDH 272* 238* 261*  CRP 13.1* 8.7* 6.7*    Lab Results  Component Value Date   SARSCOV2NAA POSITIVE (A) 02/12/2019     COVID-19 Medications:  5/26>> Remdesivir  Right-sided pleural effusion: Suspect this  is a transudate in the setting of decompensated heart failure.  Due to worsening hypoxia-CT chest does not really show a large enough pleural effusion that needs a thoracocentesis.  Continue supportive care.  Left lower extremity ulceration: No evidence of wound infection on my exam on 5/25-has completed more than 7 days of antimicrobial therapy-no longer Rocephin and Flagyl.  Continue local wound care.   1 out of 2 blood cultures positive for coag negative staph-likely a contaminant.  Hypokalemia: Repleted.  DM-2: CBG stable with Lantus 20 units and SSI.  Follow.  HTN: Controlled-continue Coreg and lisinopril  Dyslipidemia: Continue statin  Chronic pain syndrome: Stable-continue home regimen of narcotics  Depression: Stable with Cymbalta, Remeron  BPH: Continue Flomax  Chronic debility/deconditioning: Worsened by acute illness-Per patient he is mostly bed to wheelchair bound.  Claims he has not ambulated independently in years.  Morbid obesity    ABG:    Component Value Date/Time   PHART 7.304 (L) 02/12/2019 2050   PCO2ART 66.9 ([place]) 02/12/2019 2050   PO2ART 73.1 (L) 02/12/2019 2050   HCO3 32.2 (H) 02/12/2019 2050   TCO2 21 11/02/2015 2039   ACIDBASEDEF 6.0 (H) 11/02/2015 2039   O2SAT 90.2 02/12/2019 2050    Condition -gaurded  Family Communication  : Spoke to patient's brother on 5/27-none at bedside this morning  Code Status :  Full Code  Diet : Heart healthy/diabetic  Disposition Plan  :  Remain inpatient-hopefully back to SNF on 6/1.  Consults  : None  Procedures  :  None  DVT Prophylaxis  :  Lovenox  Lab Results  Component Value Date   PLT 334 02/21/2019    Inpatient Medications  Scheduled Meds:  amitriptyline  100 mg Oral QHS   aspirin EC  81 mg Oral BH-q7a   atorvastatin  80 mg Oral Daily   carvedilol  3.125 mg Oral BID WC   DULoxetine  40 mg Oral Daily   enoxaparin (LOVENOX) injection  60 mg Subcutaneous Q12H   feeding supplement (ENSURE  ENLIVE)  237 mL Oral Q24H   feeding supplement (PRO-STAT SUGAR FREE 64)  30 mL Oral TID WC   insulin aspart  0-15 Units Subcutaneous Q4H   insulin glargine  20 Units Subcutaneous Daily   mirtazapine  15 mg Oral QHS   nutrition supplement (JUVEN)  1 packet Oral BID BM   oxyCODONE  10 mg Oral BID   pantoprazole  40 mg Oral Daily   tamsulosin  0.4 mg Oral QPC supper   torsemide  20 mg Oral Daily   Continuous Infusions:  sodium chloride 10 mL/hr at 02/20/19 2100   PRN Meds:.sodium chloride, acetaminophen, albuterol, dextrose, guaiFENesin-dextromethorphan, oxyCODONE, polyethylene glycol, polyvinyl alcohol, tiZANidine  Antibiotics  :    Anti-infectives (From admission, onward)   Start     Dose/Rate Route Frequency Ordered Stop   02/17/19 1000  remdesivir 100 mg in sodium chloride 0.9 % 230 mL IVPB     100 mg 500 mL/hr over 30 Minutes Intravenous Every 24 hours 02/16/19 0912 02/20/19 1205   02/16/19 1230  cefTRIAXone (ROCEPHIN) 2 g in sodium chloride 0.9 % 100 mL IVPB  Status:  Discontinued     2 g 200 mL/hr over 30 Minutes Intravenous Every 24 hours 02/16/19 1134 02/17/19 1133   02/16/19 1000  remdesivir 200 mg in sodium chloride 0.9 % 210 mL IVPB     200 mg 500 mL/hr over 30 Minutes Intravenous Once 02/16/19 0912 02/16/19 1037   02/15/19 1230  cefTRIAXone (ROCEPHIN) 1 g in sodium chloride 0.9 % 100 mL IVPB  Status:  Discontinued     1 g 200 mL/hr over 30 Minutes Intravenous Every 24 hours 02/15/19 1207 02/16/19 1134   02/13/19 1000  vancomycin (VANCOCIN) IVPB 1000 mg/200 mL premix  Status:  Discontinued     1,000 mg 200 mL/hr over 60 Minutes Intravenous Every 12 hours 02/12/19 2111 02/15/19 1207   02/13/19 0600  ceFEPIme (MAXIPIME) 2 g in  sodium chloride 0.9 % 100 mL IVPB  Status:  Discontinued     2 g 200 mL/hr over 30 Minutes Intravenous Every 8 hours 02/12/19 2111 02/15/19 1207   02/12/19 2115  ceFEPIme (MAXIPIME) 2 g in sodium chloride 0.9 % 100 mL IVPB     2 g 200  mL/hr over 30 Minutes Intravenous  Once 02/12/19 2102 02/12/19 2155   02/12/19 2115  vancomycin (VANCOCIN) 2,500 mg in sodium chloride 0.9 % 500 mL IVPB     2,500 mg 250 mL/hr over 120 Minutes Intravenous  Once 02/12/19 2111 02/13/19 0010   02/12/19 2100  metroNIDAZOLE (FLAGYL) IVPB 500 mg  Status:  Discontinued     500 mg 100 mL/hr over 60 Minutes Intravenous Every 8 hours 02/12/19 2046 02/17/19 1133       Time Spent in minutes  25   Oren Binet M.D on 02/21/2019 at 11:31 AM  To page go to [email or web address] - use universal password  [facility] -  Office  [phone]  Admit date - 02/12/2019    9    Objective:   Vitals:   02/20/19 2001 02/20/19 2303 02/21/19 0332 02/21/19 0752  BP: 137/74 (!) 140/97 140/83 127/70  Pulse: 80 80 75 83  Resp: 17 18 16    Temp: 98.2 F (36.8 C)  98.5 F (36.9 C)   TempSrc: Oral  Oral   SpO2: 92% 94% 93%   Weight:      Height:        Wt Readings from Last 3 Encounters:  02/20/19 112.1 kg  11/28/17 123.7 kg  11/03/17 120.7 kg     Intake/Output Summary (Last 24 hours) at 02/21/2019 1131 Last data filed at 02/21/2019 0600 Gross per 24 hour  Intake 134.65 ml  Output 2525 ml  Net -2390.35 ml     Physical Exam General appearance:Awake, alert, not in any distress.  Eyes:no scleral icterus. HEENT: Atraumatic and Normocephalic Neck: supple, no JVD. Resp:Good air entry bilaterally,no rales or rhonchi CVS: S1 S2 regular, no murmurs.  GI: Bowel sounds present, Non tender and not distended with no gaurding, rigidity or rebound. Extremities: B/L Lower Ext shows no edema, both legs are warm to touch Neurology: Has generalized weakness but nonfocal     Data Review:    CBC Recent Labs  Lab 02/15/19 0500 02/16/19 0409 02/17/19 0307 02/18/19 0350 02/19/19 0500 02/20/19 0359 02/21/19 0400  WBC 4.1 4.0 4.7 3.9* 4.8 4.3 4.9  HGB 11.0* 11.9* 11.3* 11.3* 11.4* 10.6* 11.7*  HCT 40.5 44.7 40.5 41.8 40.8 40.0 42.3  PLT 140* 150  184 221 257 278 334  MCV 68.1* 68.8* 66.8* 67.7* 67.3* 68.1* 67.4*  MCH 18.5* 18.3* 18.6* 18.3* 18.8* 18.1* 18.6*  MCHC 27.2* 26.6* 27.9* 27.0* 27.9* 26.5* 27.7*  RDW 24.2* 24.8* 23.9* 24.1* 24.3* 24.0* 24.4*  LYMPHSABS 1.2 1.2 1.2  --   --   --   --   MONOABS 0.2 0.4 0.4  --   --   --   --   EOSABS 0.0 0.0 0.0  --   --   --   --   BASOSABS 0.0 0.0 0.0  --   --   --   --     Chemistries  Recent Labs  Lab 02/17/19 0307 02/18/19 0350 02/19/19 0500 02/20/19 0359 02/21/19 0400  NA 142 146* 143 142 143  K 3.5 2.9* 3.6 3.4* 3.8  CL 97* 100 100 99 96*  CO2 34* 37* 34* 36* 37*  GLUCOSE 148* 130* 125* 162* 124*  BUN 30* 23 17 12 13   CREATININE 0.87 0.89 0.77 0.82 0.91  CALCIUM 8.2* 8.0* 8.0* 7.9* 8.3*  AST 30 25 27 31  36  ALT 19 17 17 19 23   ALKPHOS 86 86 83 79 84  BILITOT 0.2* 0.4 0.3 0.3 0.3   ------------------------------------------------------------------------------------------------------------------ No results for input(s): CHOL, HDL, LDLCALC, TRIG, CHOLHDL, LDLDIRECT in the last 72 hours.  Lab Results  Component Value Date   HGBA1C 8.0 (H) 02/13/2019   ------------------------------------------------------------------------------------------------------------------ No results for input(s): TSH, T4TOTAL, T3FREE, THYROIDAB in the last 72 hours.  Invalid input(s): FREET3 ------------------------------------------------------------------------------------------------------------------ Recent Labs    02/20/19 0359 02/21/19 0400  FERRITIN 131 126    Coagulation profile No results for input(s): INR, PROTIME in the last 168 hours.  Recent Labs    02/20/19 0359 02/21/19 0400  DDIMER 0.51* 0.40    Cardiac Enzymes No results for input(s): CKMB, TROPONINI, MYOGLOBIN in the last 168 hours.  Invalid input(s): CK ------------------------------------------------------------------------------------------------------------------    Component Value Date/Time   BNP  18.4 02/16/2019 0409    Micro Results Recent Results (from the past 240 hour(s))  Blood Culture (routine x 2)     Status: None   Collection Time: 02/12/19  4:24 PM  Result Value Ref Range Status   Specimen Description   Final    BLOOD LEFT FOREARM Performed at [facility], [place] [address], [place], [place] [address]    Special Requests   Final    BOTTLES DRAWN AEROBIC AND ANAEROBIC Blood Culture adequate volume Performed at [facility] [address], [place], [place] [address]    Culture   Final    NO GROWTH 5 DAYS Performed at [facility], [place] [address], [place], [place] [address]    Report Status 02/17/2019 FINAL  Final  Blood Culture (routine x 2)     Status: Abnormal   Collection Time: 02/12/19  4:53 PM  Result Value Ref Range Status   Specimen Description   Final    BLOOD RIGHT ANTECUBITAL Performed at [place] [address], [place], [place] [address]    Special Requests   Final    BOTTLES DRAWN AEROBIC AND ANAEROBIC Blood Culture adequate volume Performed at [facility] [address], [place], [place] [address]    Culture  Setup Time   Final    GRAM POSITIVE COCCI IN CLUSTERS ANAEROBIC BOTTLE ONLY CRITICAL RESULT CALLED TO, READ BACK BY AND VERIFIED WITH: C. SHADE, PHARMD ([place]) AT 1410 ON 02/13/19 BY C. JESSUP, MLT.    Culture (A)  Final    STAPHYLOCOCCUS SPECIES (COAGULASE NEGATIVE) THE SIGNIFICANCE OF ISOLATING THIS ORGANISM FROM A SINGLE SET OF BLOOD CULTURES WHEN MULTIPLE SETS ARE DRAWN IS UNCERTAIN. PLEASE NOTIFY THE MICROBIOLOGY DEPARTMENT WITHIN ONE WEEK IF SPECIATION AND SENSITIVITIES ARE REQUIRED. Performed at [facility], [place] [address] [place], [place] [address]    Report Status 02/15/2019 FINAL  Final  Blood Culture ID Panel (Reflexed)     Status: Abnormal   Collection Time: 02/12/19  4:53 PM  Result Value Ref Range Status   Enterococcus species NOT  DETECTED NOT DETECTED Final   Listeria monocytogenes NOT DETECTED NOT DETECTED Final   Staphylococcus species DETECTED (A) NOT DETECTED Final    Comment: Methicillin (oxacillin) resistant coagulase negative staphylococcus. Possible blood culture contaminant (unless isolated from more than one blood culture draw or clinical case suggests pathogenicity). No antibiotic  treatment is indicated for blood  culture contaminants. CRITICAL RESULT CALLED TO, READ BACK BY AND VERIFIED WITH: C. SHADE, PHARMD ([place]) AT 1410 ON 02/13/19 BY C. JESSUP, MLT.    Staphylococcus aureus (BCID) NOT DETECTED NOT DETECTED Final   Methicillin resistance DETECTED (A) NOT DETECTED Final    Comment: CRITICAL RESULT CALLED TO, READ BACK BY AND VERIFIED WITH: C. SHADE, PHARMD ([place]) AT 1410 ON 02/13/19 BY C. JESSUP, MLT.    Streptococcus species NOT DETECTED NOT DETECTED Final   Streptococcus agalactiae NOT DETECTED NOT DETECTED Final   Streptococcus pneumoniae NOT DETECTED NOT DETECTED Final   Streptococcus pyogenes NOT DETECTED NOT DETECTED Final   Acinetobacter baumannii NOT DETECTED NOT DETECTED Final   Enterobacteriaceae species NOT DETECTED NOT DETECTED Final   Enterobacter cloacae complex NOT DETECTED NOT DETECTED Final   Escherichia coli NOT DETECTED NOT DETECTED Final   Klebsiella oxytoca NOT DETECTED NOT DETECTED Final   Klebsiella pneumoniae NOT DETECTED NOT DETECTED Final   Proteus species NOT DETECTED NOT DETECTED Final   Serratia marcescens NOT DETECTED NOT DETECTED Final   Haemophilus influenzae NOT DETECTED NOT DETECTED Final   Neisseria meningitidis NOT DETECTED NOT DETECTED Final   Pseudomonas aeruginosa NOT DETECTED NOT DETECTED Final   Candida albicans NOT DETECTED NOT DETECTED Final   Candida glabrata NOT DETECTED NOT DETECTED Final   Candida krusei NOT DETECTED NOT DETECTED Final   Candida parapsilosis NOT DETECTED NOT DETECTED Final   Candida tropicalis NOT DETECTED NOT DETECTED Final     Comment: Performed at [facility], [place] [address] [place], [place] [address]  SARS Coronavirus 2 (CEPHEID- Performed in [facility] lab), Hosp Order     Status: Abnormal   Collection Time: 02/12/19  7:18 PM  Result Value Ref Range Status   SARS Coronavirus 2 POSITIVE (A) NEGATIVE Final    Comment: RESULT CALLED TO, READ BACK BY AND VERIFIED WITH: O.IDOWU AT 2042 ON 02/12/19 BY N.THOMPSON (NOTE) If result is NEGATIVE SARS-CoV-2 target nucleic acids are NOT DETECTED. The SARS-CoV-2 RNA is generally detectable in upper and lower  respiratory specimens during the acute phase of infection. The lowest  concentration of SARS-CoV-2 viral copies this assay can detect is 250  copies / mL. A negative result does not preclude SARS-CoV-2 infection  and should not be used as the sole basis for treatment or other  patient management decisions.  A negative result may occur with  improper specimen collection / handling, submission of specimen other  than nasopharyngeal swab, presence of viral mutation(s) within the  areas targeted by this assay, and inadequate number of viral copies  (<250 copies / mL). A negative result must be combined with clinical  observations, patient history, and epidemiological information. If result is POSITIVE SARS-CoV-2 target nucleic acids are DETECTE D. The SARS-CoV-2 RNA is generally detectable in upper and lower  respiratory specimens during the acute phase of infection.  Positive  results are indicative of active infection with SARS-CoV-2.  Clinical  correlation with patient history and other diagnostic information is  necessary to determine patient infection status.  Positive results do  not rule out bacterial infection or co-infection with other viruses. If result is PRESUMPTIVE POSTIVE SARS-CoV-2 nucleic acids MAY BE PRESENT.   A presumptive positive result was obtained on the submitted specimen  and confirmed on repeat testing.  While 2019 novel  coronavirus  (SARS-CoV-2) nucleic acids may be present in the submitted sample  additional confirmatory testing may be necessary  for epidemiological  and / or clinical management purposes  to differentiate between  SARS-CoV-2 and other Sarbecovirus currently known to infect humans.  If clinically indicated additional testing with an alternate test  methodology (LAB745 3) is advised. The SARS-CoV-2 RNA is generally  detectable in upper and lower respiratory specimens during the acute  phase of infection. The expected result is Negative. Fact Sheet for Patients:  StrictlyIdeas.no Fact Sheet for Healthcare Providers: BankingDealers.co.za This test is not yet approved or cleared by the [place] FDA and has been authorized for detection and/or diagnosis of SARS-CoV-2 by FDA under an Emergency Use Authorization (EUA).  This EUA will remain in effect (meaning this test can be used) for the duration of the COVID-19 declaration under Section 564(b)(1) of the Act, 21 U.S.C. section 360bbb-3(b)(1), unless the authorization is terminated or revoked sooner. Performed at [facility], [place] [address], [place], [place] [address]   Culture, Urine     Status: None   Collection Time: 02/12/19  8:40 PM  Result Value Ref Range Status   Specimen Description   Final    Urine Performed at [facility] [address], [place], [place] [address]    Special Requests   Final    NONE Performed at [facility], [place] [address] [place] [address], [place], [place] [address]    Culture   Final    NO GROWTH Performed at [facility], [place] [address], [place], [place] [address]    Report Status 02/14/2019 FINAL  Final  MRSA PCR Screening     Status: None   Collection Time: 02/13/19  9:00 AM  Result Value Ref Range Status   MRSA by PCR NEGATIVE NEGATIVE Final    Comment:        The GeneXpert MRSA Assay  (FDA approved for NASAL specimens only), is one component of a comprehensive MRSA colonization surveillance program. It is not intended to diagnose MRSA infection nor to guide or monitor treatment for MRSA infections. Performed at [facility], [place] [address] [place], [place] [address]     Radiology Reports Ct Angio Chest Pe W Or Wo Contrast  Result Date: 02/16/2019 CLINICAL DATA:  COVID-19 infection, dyspnea, rule out PE EXAM: CT ANGIOGRAPHY CHEST WITH CONTRAST TECHNIQUE: Multidetector CT imaging of the chest was performed using the standard protocol during bolus administration of intravenous contrast. Multiplanar CT image reconstructions and MIPs were obtained to evaluate the vascular anatomy. CONTRAST:  118mL OMNIPAQUE IOHEXOL 350 MG/ML SOLN COMPARISON:  08/21/2016 FINDINGS: Cardiovascular: Satisfactory opacification of the pulmonary arteries to the segmental level. No evidence of pulmonary embolism. Cardiomegaly. Scattered coronary artery calcifications. Trace pericardial effusion. No pericardial effusion. Mediastinum/Nodes: There are enlarged mediastinal lymph nodes, largest pretracheal nodes measuring 2.1 cm in short axis, increased compared to prior CT. Thyroid gland, trachea, and esophagus demonstrate no significant findings. Lungs/Pleura: There is bilateral, right greater than left heterogeneous and ground-glass opacity with mild interlobular septal thickening. There is chronic atelectasis or scarring and volume loss of the right lung base. Upper Abdomen: No acute abnormality. Musculoskeletal: No chest wall abnormality. No acute or significant osseous findings. Review of the MIP images confirms the above findings. IMPRESSION: 1.  Negative examination for pulmonary embolism. 2. There is bilateral, right greater than left heterogeneous and ground-glass opacity with mild interlobular septal thickening. There is chronic atelectasis or scarring and volume loss of the right  lung base. Findings are consistent viral infection and/or edema with chronic change at  the right lung base. 3. Cardiomegaly, coronary artery disease, and trace pericardial effusion. Electronically Signed   By: Eddie Candle M.D.   On: 02/16/2019 11:26   Dg Chest Port 1 View  Result Date: 02/12/2019 CLINICAL DATA:  Shortness of breath EXAM: PORTABLE CHEST 1 VIEW COMPARISON:  Chest CT 08/21/2016 FINDINGS: Large right pleural effusion. Right basilar atelectasis. Cardiomediastinal contours are normal. No acute osseous abnormality. IMPRESSION: Large right pleural effusion with right basilar atelectasis. Electronically Signed   By: Ulyses Jarred M.D.   On: 02/12/2019 18:12   Dg Chest Port 1v Same Day  Result Date: 02/16/2019 CLINICAL DATA:  Shortness of breath. EXAM: PORTABLE CHEST 1 VIEW COMPARISON:  Radiograph of Feb 12, 2019. FINDINGS: Stable cardiomediastinal silhouette. No pneumothorax is noted. Moderate size right pleural effusion is noted with associated atelectasis or edema. Mild left basilar subsegmental atelectasis or edema is noted. Minimal left pleural effusion is noted. Bony thorax is unremarkable. IMPRESSION: Moderate right pleural effusion is noted with associated atelectasis or edema. Mild left basilar subsegmental atelectasis or edema is noted. Electronically Signed   By: Marijo Conception M.D.   On: 02/16/2019 08:09

## 2019-02-21 NOTE — Progress Notes (Signed)
Wound measurement  LLE  3x2 5x3  14x14  Moderate drainage, odorous   RLE  7x3  Moderate drainage, pink odorous     Administered wound care per order.

## 2019-02-22 DIAGNOSIS — E1351 Other specified diabetes mellitus with diabetic peripheral angiopathy without gangrene: Secondary | ICD-10-CM

## 2019-02-22 DIAGNOSIS — E1365 Other specified diabetes mellitus with hyperglycemia: Secondary | ICD-10-CM

## 2019-02-22 LAB — CBC
HCT: 42.3 % (ref 39.0–52.0)
Hemoglobin: 11.7 g/dL — ABNORMAL LOW (ref 13.0–17.0)
MCH: 18.5 pg — ABNORMAL LOW (ref 26.0–34.0)
MCHC: 27.7 g/dL — ABNORMAL LOW (ref 30.0–36.0)
MCV: 66.7 fL — ABNORMAL LOW (ref 80.0–100.0)
Platelets: 342 10*3/uL (ref 150–400)
RBC: 6.34 MIL/uL — ABNORMAL HIGH (ref 4.22–5.81)
RDW: 23.9 % — ABNORMAL HIGH (ref 11.5–15.5)
WBC: 5.6 10*3/uL (ref 4.0–10.5)
nRBC: 0 % (ref 0.0–0.2)

## 2019-02-22 LAB — GLUCOSE, CAPILLARY
Glucose-Capillary: 261 mg/dL — ABNORMAL HIGH (ref 70–99)
Glucose-Capillary: 147 mg/dL — ABNORMAL HIGH (ref 70–99)
Glucose-Capillary: 92 mg/dL (ref 70–99)
Glucose-Capillary: 243 mg/dL — ABNORMAL HIGH (ref 70–99)

## 2019-02-22 LAB — COMPREHENSIVE METABOLIC PANEL
ALT: 26 U/L (ref 0–44)
AST: 44 U/L — ABNORMAL HIGH (ref 15–41)
Albumin: 2.5 g/dL — ABNORMAL LOW (ref 3.5–5.0)
Alkaline Phosphatase: 95 U/L (ref 38–126)
Anion gap: 8 (ref 5–15)
BUN: 15 mg/dL (ref 8–23)
CO2: 38 mmol/L — ABNORMAL HIGH (ref 22–32)
Calcium: 8.3 mg/dL — ABNORMAL LOW (ref 8.9–10.3)
Chloride: 94 mmol/L — ABNORMAL LOW (ref 98–111)
Creatinine, Ser: 0.89 mg/dL (ref 0.61–1.24)
GFR calc Af Amer: >60 mL/min (ref >60)
GFR calc non Af Amer: >60 mL/min (ref >60)
Glucose, Bld: 124 mg/dL — ABNORMAL HIGH (ref 70–99)
Potassium: 3.3 mmol/L — ABNORMAL LOW (ref 3.5–5.1)
Sodium: 140 mmol/L (ref 135–145)
Total Bilirubin: 0.3 mg/dL (ref 0.3–1.2)
Total Protein: 7.6 g/dL (ref 6.5–8.1)

## 2019-02-22 LAB — FERRITIN: Ferritin: 111 ng/mL (ref 24–336)

## 2019-02-22 LAB — D-DIMER, QUANTITATIVE: D-Dimer, Quant: 0.59 ug/mL-FEU — ABNORMAL HIGH (ref 0.00–0.50)

## 2019-02-22 LAB — LACTATE DEHYDROGENASE: LDH: 254 U/L — ABNORMAL HIGH (ref 98–192)

## 2019-02-22 LAB — C-REACTIVE PROTEIN: CRP: 5.6 mg/dL — ABNORMAL HIGH (ref <1.0)

## 2019-02-22 MED ORDER — POTASSIUM CHLORIDE CRYS ER 20 MEQ PO TBCR: 20.0000 meq | EXTENDED_RELEASE_TABLET | Freq: Every day | ORAL | Status: DC

## 2019-02-22 MED ORDER — POTASSIUM CHLORIDE CRYS ER 20 MEQ PO TBCR
40.0000 meq | EXTENDED_RELEASE_TABLET | Freq: Once | ORAL | Status: AC
  Administered 2019-02-22: 40 meq via ORAL
  Filled 2019-02-22: qty 2

## 2019-02-22 MED ORDER — OXYCODONE HCL 5 MG PO TABS: 5.0000 mg | ORAL_TABLET | Freq: Three times a day (TID) | ORAL | 0 refills | Status: DC | PRN

## 2019-02-22 MED ORDER — OXYCODONE HCL ER 10 MG PO T12A: 10.0000 mg | EXTENDED_RELEASE_TABLET | Freq: Two times a day (BID) | ORAL | 0 refills | Status: DC

## 2019-02-22 MED ORDER — INSULIN GLARGINE 100 UNIT/ML ~~LOC~~ SOLN: 20.0000 [IU] | Freq: Every day | SUBCUTANEOUS | 11 refills | Status: DC

## 2019-02-22 MED ORDER — TORSEMIDE 20 MG PO TABS: 20.0000 mg | ORAL_TABLET | Freq: Every day | ORAL | Status: DC

## 2019-02-22 MED ORDER — ENSURE ENLIVE PO LIQD: 237.0000 mL | ORAL | 12 refills | Status: DC

## 2019-02-22 NOTE — Discharge Summary (Signed)
PATIENT DETAILS Name: [name] Age: 63 y.o. Sex: male Date of Birth: 1955-10-29 MRN: [id number]. Admitting Physician: Bethena Roys, MD DHR:CBULA, Jaci Standard, MD  Admit Date: 02/12/2019 Discharge date: 02/22/2019  Recommendations for Outpatient Follow-up:  1. Follow up with PCP in 1-2 weeks 2. Please obtain BMP/CBC in one week  Admitted From:  SNF  Disposition: SNF   Home Health: No  Equipment/Devices: None  Discharge Condition: Stable  CODE STATUS: FULL CODE  Diet recommendation:  Heart Healthy / Carb Modified  Brief Summary: See H&P, Labs, Consult and Test reports for all details in brief,Patient is a 63 y.o. male with PMHx of phonic lower extremity wounds, chronic diastolic heart failure, chronic hypoxic respiratory failure on home O2, HTN, DM-2-presented with dyspnea-found to have acute hypoxic respiratory failure in the setting of pleural effusion, decompensated diastolic heart failure and was also found to have COVID-19 infection.  Initially improved with diuresis-however acutely worsened with worsening hypoxia-and was started on Remdesivir with significant clinical improvement.  Brief Hospital Course: Acute on chronic Hypoxic Resp Failure due to decompensated diastolic heart failure and Covid 19 Viral pneumonia: Remains stable-on 4 L of oxygen this morning.  Has completed a 5-day course of Remdesivir on 5/30.  Inflammatory markers including CRP continue to downtrend-could have some mild elevated CRP chronically due to lower extremity wounds.  Initially on IV Lasix-but due to improvement in volume status-transition to oral diuretics.  Right-sided pleural effusion: Suspect this is a transudate in the setting of decompensated heart failure.  Due to worsening hypoxia-CT chest does not really show a large enough pleural effusion that needs a thoracocentesis.  Continue supportive care.  Left lower extremity ulceration: No evidence of wound infection on my exam  on 5/25-has completed more than 7 days of antimicrobial therapy-no longer Rocephin and Flagyl.  Continue local wound care(see wound care instructions below).   1 out of 2 blood cultures positive for coag negative staph-likely a contaminant.  Hypokalemia: Repleted.  DM-2: CBG stable with Lantus 20 units and SSI.  Follow.  HTN: Controlled-continue Coreg and lisinopril  Dyslipidemia: Continue statin  Chronic pain syndrome: Stable-continue home regimen of narcotics  Depression: Stable with Cymbalta, Remeron  BPH: Continue Flomax  Chronic debility/deconditioning: Worsened by acute illness-Per patient he is mostly bed to wheelchair bound.  Claims he has not ambulated independently in years.  Morbid obesity  Procedures/Studies: None  Discharge Diagnoses:  Active Problems:   Dyspnea  Discharge Instructions:  Activity:  As tolerated  Wound care: Dressing procedure/placement/frequency:  Cleanse wounds on bilateral lower legs with saline. Pat dry. Apply foam dressings over open wounds. Secure with kerlex if needed. Change daily.  Discharge Instructions    (HEART FAILURE PATIENTS) Call MD:  Anytime you have any of the following symptoms: 1) 3 pound weight gain in 24 hours or 5 pounds in 1 week 2) shortness of breath, with or without a dry hacking cough 3) swelling in the hands, feet or stomach 4) if you have to sleep on extra pillows at night in order to breathe.   Complete by:  As directed    Diet - low sodium heart healthy   Complete by:  As directed    Diet Carb Modified   Complete by:  As directed    Discharge instructions   Complete by:  As directed    Follow with Primary MD  Rondell Reams, Jaci Standard, MD in 1 week  Please get a complete blood count and chemistry panel checked by your  Primary MD at your next visit, and again as instructed by your Primary MD.  Get Medicines reviewed and adjusted: Please take all your medications with you for your next visit with your  Primary MD  Laboratory/radiological data: Please request your Primary MD to go over all hospital tests and procedure/radiological results at the follow up, please ask your Primary MD to get all Hospital records sent to his/her office.  In some cases, they will be blood work, cultures and biopsy results pending at the time of your discharge. Please request that your primary care M.D. follows up on these results.  Also Note the following: If you experience worsening of your admission symptoms, develop shortness of breath, life threatening emergency, suicidal or homicidal thoughts you must seek medical attention immediately by calling 911 or calling your MD immediately  if symptoms less severe.  You must read complete instructions/literature along with all the possible adverse reactions/side effects for all the Medicines you take and that have been prescribed to you. Take any new Medicines after you have completely understood and accpet all the possible adverse reactions/side effects.   Do not drive when taking Pain medications or sleeping medications (Benzodaizepines)  Do not take more than prescribed Pain, Sleep and Anxiety Medications. It is not advisable to combine anxiety,sleep and pain medications without talking with your primary care practitioner  Special Instructions: If you have smoked or chewed Tobacco  in the last 2 yrs please stop smoking, stop any regular Alcohol  and or any Recreational drug use.  Wear Seat belts while driving.  Please note: You were cared for by a hospitalist during your hospital stay. Once you are discharged, your primary care physician will handle any further medical issues. Please note that NO REFILLS for any discharge medications will be authorized once you are discharged, as it is imperative that you return to your primary care physician (or establish a relationship with a primary care physician if you do not have one) for your post hospital discharge needs so  that they can reassess your need for medications and monitor your lab values.  ?   Person Under Monitoring Name: Cheikh Bramble  Location: [place] [address]   Infection Prevention Recommendations for Individuals Confirmed to have, or Being Evaluated for, 2019 Novel Coronavirus (COVID-19) Infection Who Receive Care at Home  Individuals who are confirmed to have, or are being evaluated for, COVID-19 should follow the prevention steps below until a healthcare provider or local or state health department says they can return to normal activities.  Stay home except to get medical care You should restrict activities outside your home, except for getting medical care. Do not go to work, school, or public areas, and do not use public transportation or taxis.  Call ahead before visiting your doctor Before your medical appointment, call the healthcare provider and tell them that you have, or are being evaluated for, COVID-19 infection. This will help the healthcare provider's office take steps to keep other people from getting infected. Ask your healthcare provider to call the local or state health department.  Monitor your symptoms Seek prompt medical attention if your illness is worsening (e.g., difficulty breathing). Before going to your medical appointment, call the healthcare provider and tell them that you have, or are being evaluated for, COVID-19 infection. Ask your healthcare provider to call the local or state health department.  Wear a facemask You should wear a facemask that covers your nose and mouth when you  are in the same room with other people and when you visit a healthcare provider. People who live with or visit you should also wear a facemask while they are in the same room with you.  Separate yourself from other people in your home As much as possible, you should stay in a different room from other people in your home. Also, you should use a  separate bathroom, if available.  Avoid sharing household items You should not share dishes, drinking glasses, cups, eating utensils, towels, bedding, or other items with other people in your home. After using these items, you should wash them thoroughly with soap and water.  Cover your coughs and sneezes Cover your mouth and nose with a tissue when you cough or sneeze, or you can cough or sneeze into your sleeve. Throw used tissues in a lined trash can, and immediately wash your hands with soap and water for at least 20 seconds or use an alcohol-based hand rub.  Wash your [facility] your hands often and thoroughly with soap and water for at least 20 seconds. You can use an alcohol-based hand sanitizer if soap and water are not available and if your hands are not visibly dirty. Avoid touching your eyes, nose, and mouth with unwashed hands.   Prevention Steps for Caregivers and Household Members of Individuals Confirmed to have, or Being Evaluated for, COVID-19 Infection Being Cared for in the Home  If you live with, or provide care at home for, a person confirmed to have, or being evaluated for, COVID-19 infection please follow these guidelines to prevent infection:  Follow healthcare provider's instructions Make sure that you understand and can help the patient follow any healthcare provider instructions for all care.  Provide for the patient's basic needs You should help the patient with basic needs in the home and provide support for getting groceries, prescriptions, and other personal needs.  Monitor the patient's symptoms If they are getting sicker, call his or her medical provider and tell them that the patient has, or is being evaluated for, COVID-19 infection. This will help the healthcare provider's office take steps to keep other people from getting infected. Ask the healthcare provider to call the local or state health department.  Limit the number of people who have  contact with the patient If possible, have only one caregiver for the patient. Other household members should stay in another home or place of residence. If this is not possible, they should stay in another room, or be separated from the patient as much as possible. Use a separate bathroom, if available. Restrict visitors who do not have an essential need to be in the home.  Keep older adults, very young children, and other sick people away from the patient Keep older adults, very young children, and those who have compromised immune systems or chronic health conditions away from the patient. This includes people with chronic heart, lung, or kidney conditions, diabetes, and cancer.  Ensure good ventilation Make sure that shared spaces in the home have good air flow, such as from an air conditioner or an opened window, weather permitting.  Wash your hands often Wash your hands often and thoroughly with soap and water for at least 20 seconds. You can use an alcohol based hand sanitizer if soap and water are not available and if your hands are not visibly dirty. Avoid touching your eyes, nose, and mouth with unwashed hands. Use disposable paper towels to dry your hands. If not available, use  dedicated cloth towels and replace them when they become wet.  Wear a facemask and gloves Wear a disposable facemask at all times in the room and gloves when you touch or have contact with the patient's blood, body fluids, and/or secretions or excretions, such as sweat, saliva, sputum, nasal mucus, vomit, urine, or feces.  Ensure the mask fits over your nose and mouth tightly, and do not touch it during use. Throw out disposable facemasks and gloves after using them. Do not reuse. Wash your hands immediately after removing your facemask and gloves. If your personal clothing becomes contaminated, carefully remove clothing and launder. Wash your hands after handling contaminated clothing. Place all used  disposable facemasks, gloves, and other waste in a lined container before disposing them with other household waste. Remove gloves and wash your hands immediately after handling these items.  Do not share dishes, glasses, or other household items with the patient Avoid sharing household items. You should not share dishes, drinking glasses, cups, eating utensils, towels, bedding, or other items with a patient who is confirmed to have, or being evaluated for, COVID-19 infection. After the person uses these items, you should wash them thoroughly with soap and water.  Wash laundry thoroughly Immediately remove and wash clothes or bedding that have blood, body fluids, and/or secretions or excretions, such as sweat, saliva, sputum, nasal mucus, vomit, urine, or feces, on them. Wear gloves when handling laundry from the patient. Read and follow directions on labels of laundry or clothing items and detergent. In general, wash and dry with the warmest temperatures recommended on the label.  Clean all areas the individual has used often Clean all touchable surfaces, such as counters, tabletops, doorknobs, bathroom fixtures, toilets, phones, keyboards, tablets, and bedside tables, every day. Also, clean any surfaces that may have blood, body fluids, and/or secretions or excretions on them. Wear gloves when cleaning surfaces the patient has come in contact with. Use a diluted bleach solution (e.g., dilute bleach with 1 part bleach and 10 parts water) or a household disinfectant with a label that says EPA-registered for coronaviruses. To make a bleach solution at home, add 1 tablespoon of bleach to 1 quart (4 cups) of water. For a larger supply, add  cup of bleach to 1 gallon (16 cups) of water. Read labels of cleaning products and follow recommendations provided on product labels. Labels contain instructions for safe and effective use of the cleaning product including precautions you should take when applying  the product, such as wearing gloves or eye protection and making sure you have good ventilation during use of the product. Remove gloves and wash hands immediately after cleaning.  Monitor yourself for signs and symptoms of illness Caregivers and household members are considered close contacts, should monitor their health, and will be asked to limit movement outside of the home to the extent possible. Follow the monitoring steps for close contacts listed on the symptom monitoring form.   ? If you have additional questions, contact your local health department or call the epidemiologist on call at [phone] (available 24/7). ? This guidance is subject to change. For the most up-to-date guidance from CDC, please refer to their website: YouBlogs.pl   Increase activity slowly   Complete by:  As directed      Allergies as of 02/22/2019      Reactions   Other    Pt is a Jehovah Witness. No blood products.   Sulfa Antibiotics    Causes flu-like symptom : sweating, chills,  fever, body aches      Medication List    STOP taking these medications   furosemide 40 MG tablet Commonly known as:  LASIX   insulin aspart protamine- aspart (70-30) 100 UNIT/ML injection Commonly known as:  NOVOLOG MIX 70/30     TAKE these medications   acetaminophen 325 MG tablet Commonly known as:  TYLENOL Take 2 tablets (650 mg total) by mouth every 6 (six) hours as needed for mild pain (or Fever >/= 101).   albuterol (2.5 MG/3ML) 0.083% nebulizer solution Commonly known as:  PROVENTIL Inhale 3 mLs into the lungs every 4 (four) hours as needed for shortness of breath.   amitriptyline 100 MG tablet Commonly known as:  ELAVIL Take 100 mg by mouth at bedtime.   aspirin EC 81 MG tablet Take 81 mg by mouth every morning.   atorvastatin 80 MG tablet Commonly known as:  LIPITOR Take 80 mg by mouth daily.   carvedilol 3.125 MG  tablet Commonly known as:  Coreg Take 1 tablet (3.125 mg total) by mouth 2 (two) times daily with a meal.   CENTRUM ADULTS PO Take 1 tablet by mouth daily.   DULoxetine 20 MG capsule Commonly known as:  CYMBALTA Take 40 mg by mouth 2 (two) times daily.   feeding supplement (ENSURE ENLIVE) Liqd Take 237 mLs by mouth daily.   feeding supplement (PRO-STAT SUGAR FREE 64) Liqd Take 30 mLs by mouth 3 (three) times daily with meals.   ferrous sulfate 325 (65 FE) MG tablet Take 325 mg by mouth 3 (three) times daily with meals.   insulin aspart 100 UNIT/ML injection Commonly known as:  NovoLOG Before each meal 3 times a day, 140-199 - 2 units, 200-250 - 4 units, 251-299 - 6 units,  300-349 - 8 units,  350 or above 10 units. Dispense syringes and needles as needed, Ok to switch to PEN if approved. Substitute to any brand approved. DX DM2, Code E11.65   insulin glargine 100 UNIT/ML injection Commonly known as:  LANTUS Inject 0.2 mLs (20 Units total) into the skin daily.   lisinopril 2.5 MG tablet Commonly known as:  ZESTRIL Take 1 tablet (2.5 mg total) by mouth daily.   metFORMIN 500 MG tablet Commonly known as:  GLUCOPHAGE Take 500 mg by mouth daily.   mirtazapine 15 MG tablet Commonly known as:  REMERON Take 15 mg by mouth at bedtime.   multivitamin with minerals Tabs tablet Take 1 tablet by mouth daily.   oxyCODONE 5 MG immediate release tablet Commonly known as:  Oxy IR/ROXICODONE Take 5 mg by mouth 3 (three) times daily as needed for severe pain.   oxyCODONE 10 mg 12 hr tablet Commonly known as:  OXYCONTIN Take 1 tablet (10 mg total) by mouth every 12 (twelve) hours.   pantoprazole 40 MG tablet Commonly known as:  PROTONIX Take 1 tablet (40 mg total) by mouth 2 (two) times daily.   potassium chloride SA 20 MEQ tablet Commonly known as:  K-DUR Take 1 tablet (20 mEq total) by mouth daily. Start taking on:  February 23, 2019   tamsulosin 0.4 MG Caps capsule Commonly  known as:  FLOMAX Take 0.4 mg by mouth daily after supper.   tiZANidine 4 MG tablet Commonly known as:  ZANAFLEX Take 4 mg by mouth every 8 (eight) hours as needed for muscle spasms.   torsemide 20 MG tablet Commonly known as:  DEMADEX Take 1 tablet (20 mg total) by mouth daily. Start taking  on:  February 23, 2019      Follow-up Information    Borum, Jaci Standard, MD. Schedule an appointment as soon as possible for a visit in 2 week(s).   Specialty:  Internal Medicine Contact information: [place] [address] [phone]          Allergies  Allergen Reactions   Other     Pt is a Jehovah Witness. No blood products.   Sulfa Antibiotics     Causes flu-like symptom : sweating, chills, fever, body aches    Consultations:   None   Other Procedures/Studies: Ct Angio Chest Pe W Or Wo Contrast  Result Date: 02/16/2019 CLINICAL DATA:  COVID-19 infection, dyspnea, rule out PE EXAM: CT ANGIOGRAPHY CHEST WITH CONTRAST TECHNIQUE: Multidetector CT imaging of the chest was performed using the standard protocol during bolus administration of intravenous contrast. Multiplanar CT image reconstructions and MIPs were obtained to evaluate the vascular anatomy. CONTRAST:  186mL OMNIPAQUE IOHEXOL 350 MG/ML SOLN COMPARISON:  08/21/2016 FINDINGS: Cardiovascular: Satisfactory opacification of the pulmonary arteries to the segmental level. No evidence of pulmonary embolism. Cardiomegaly. Scattered coronary artery calcifications. Trace pericardial effusion. No pericardial effusion. Mediastinum/Nodes: There are enlarged mediastinal lymph nodes, largest pretracheal nodes measuring 2.1 cm in short axis, increased compared to prior CT. Thyroid gland, trachea, and esophagus demonstrate no significant findings. Lungs/Pleura: There is bilateral, right greater than left heterogeneous and ground-glass opacity with mild interlobular septal thickening. There is chronic atelectasis or  scarring and volume loss of the right lung base. Upper Abdomen: No acute abnormality. Musculoskeletal: No chest wall abnormality. No acute or significant osseous findings. Review of the MIP images confirms the above findings. IMPRESSION: 1.  Negative examination for pulmonary embolism. 2. There is bilateral, right greater than left heterogeneous and ground-glass opacity with mild interlobular septal thickening. There is chronic atelectasis or scarring and volume loss of the right lung base. Findings are consistent viral infection and/or edema with chronic change at the right lung base. 3. Cardiomegaly, coronary artery disease, and trace pericardial effusion. Electronically Signed   By: Eddie Candle M.D.   On: 02/16/2019 11:26   Dg Chest Port 1 View  Result Date: 02/12/2019 CLINICAL DATA:  Shortness of breath EXAM: PORTABLE CHEST 1 VIEW COMPARISON:  Chest CT 08/21/2016 FINDINGS: Large right pleural effusion. Right basilar atelectasis. Cardiomediastinal contours are normal. No acute osseous abnormality. IMPRESSION: Large right pleural effusion with right basilar atelectasis. Electronically Signed   By: Ulyses Jarred M.D.   On: 02/12/2019 18:12   Dg Chest Port 1v Same Day  Result Date: 02/16/2019 CLINICAL DATA:  Shortness of breath. EXAM: PORTABLE CHEST 1 VIEW COMPARISON:  Radiograph of Feb 12, 2019. FINDINGS: Stable cardiomediastinal silhouette. No pneumothorax is noted. Moderate size right pleural effusion is noted with associated atelectasis or edema. Mild left basilar subsegmental atelectasis or edema is noted. Minimal left pleural effusion is noted. Bony thorax is unremarkable. IMPRESSION: Moderate right pleural effusion is noted with associated atelectasis or edema. Mild left basilar subsegmental atelectasis or edema is noted. Electronically Signed   By: Marijo Conception M.D.   On: 02/16/2019 08:09      TODAY-DAY OF DISCHARGE:  Subjective:   Kue Fox today has no headache,no chest abdominal  pain,no new weakness tingling or numbness, feels much better wants to go home today.   Objective:   Blood pressure 136/85, pulse 83, temperature 99.1 F (37.3 C), temperature source Oral, resp. rate 20, height 6' (1.829 m), weight 112.4 kg,  SpO2 97 %.  Intake/Output Summary (Last 24 hours) at 02/22/2019 0954 Last data filed at 02/22/2019 0600 Gross per 24 hour  Intake 950.04 ml  Output 1200 ml  Net -249.96 ml   Filed Weights   02/19/19 0435 02/20/19 0500 02/22/19 0500  Weight: 112.1 kg 112.1 kg 112.4 kg    Exam: Awake Alert, Oriented *3, No new F.N deficits, Normal affect [place].AT,PERRAL Supple Neck,No JVD, No cervical lymphadenopathy appriciated.  Symmetrical Chest wall movement, Good air movement bilaterally, CTAB RRR,No Gallops,Rubs or new Murmurs, No Parasternal Heave +ve B.Sounds, Abd Soft, Non tender, No organomegaly appriciated, No rebound -guarding or rigidity. No Cyanosis, Clubbing or edema, No new Rash or bruise   PERTINENT RADIOLOGIC STUDIES: Ct Angio Chest Pe W Or Wo Contrast  Result Date: 02/16/2019 CLINICAL DATA:  COVID-19 infection, dyspnea, rule out PE EXAM: CT ANGIOGRAPHY CHEST WITH CONTRAST TECHNIQUE: Multidetector CT imaging of the chest was performed using the standard protocol during bolus administration of intravenous contrast. Multiplanar CT image reconstructions and MIPs were obtained to evaluate the vascular anatomy. CONTRAST:  14mL OMNIPAQUE IOHEXOL 350 MG/ML SOLN COMPARISON:  08/21/2016 FINDINGS: Cardiovascular: Satisfactory opacification of the pulmonary arteries to the segmental level. No evidence of pulmonary embolism. Cardiomegaly. Scattered coronary artery calcifications. Trace pericardial effusion. No pericardial effusion. Mediastinum/Nodes: There are enlarged mediastinal lymph nodes, largest pretracheal nodes measuring 2.1 cm in short axis, increased compared to prior CT. Thyroid gland, trachea, and esophagus demonstrate no significant findings.  Lungs/Pleura: There is bilateral, right greater than left heterogeneous and ground-glass opacity with mild interlobular septal thickening. There is chronic atelectasis or scarring and volume loss of the right lung base. Upper Abdomen: No acute abnormality. Musculoskeletal: No chest wall abnormality. No acute or significant osseous findings. Review of the MIP images confirms the above findings. IMPRESSION: 1.  Negative examination for pulmonary embolism. 2. There is bilateral, right greater than left heterogeneous and ground-glass opacity with mild interlobular septal thickening. There is chronic atelectasis or scarring and volume loss of the right lung base. Findings are consistent viral infection and/or edema with chronic change at the right lung base. 3. Cardiomegaly, coronary artery disease, and trace pericardial effusion. Electronically Signed   By: Eddie Candle M.D.   On: 02/16/2019 11:26   Dg Chest Port 1 View  Result Date: 02/12/2019 CLINICAL DATA:  Shortness of breath EXAM: PORTABLE CHEST 1 VIEW COMPARISON:  Chest CT 08/21/2016 FINDINGS: Large right pleural effusion. Right basilar atelectasis. Cardiomediastinal contours are normal. No acute osseous abnormality. IMPRESSION: Large right pleural effusion with right basilar atelectasis. Electronically Signed   By: Ulyses Jarred M.D.   On: 02/12/2019 18:12   Dg Chest Port 1v Same Day  Result Date: 02/16/2019 CLINICAL DATA:  Shortness of breath. EXAM: PORTABLE CHEST 1 VIEW COMPARISON:  Radiograph of Feb 12, 2019. FINDINGS: Stable cardiomediastinal silhouette. No pneumothorax is noted. Moderate size right pleural effusion is noted with associated atelectasis or edema. Mild left basilar subsegmental atelectasis or edema is noted. Minimal left pleural effusion is noted. Bony thorax is unremarkable. IMPRESSION: Moderate right pleural effusion is noted with associated atelectasis or edema. Mild left basilar subsegmental atelectasis or edema is noted.  Electronically Signed   By: Marijo Conception M.D.   On: 02/16/2019 08:09     PERTINENT LAB RESULTS: CBC: Recent Labs    02/21/19 0400 02/22/19 0315  WBC 4.9 5.6  HGB 11.7* 11.7*  HCT 42.3 42.3  PLT 334 342   CMET CMP     Component Value  Date/Time   NA 140 02/22/2019 0315   K 3.3 (L) 02/22/2019 0315   CL 94 (L) 02/22/2019 0315   CO2 38 (H) 02/22/2019 0315   GLUCOSE 124 (H) 02/22/2019 0315   BUN 15 02/22/2019 0315   CREATININE 0.89 02/22/2019 0315   CALCIUM 8.3 (L) 02/22/2019 0315   PROT 7.6 02/22/2019 0315   ALBUMIN 2.5 (L) 02/22/2019 0315   AST 44 (H) 02/22/2019 0315   ALT 26 02/22/2019 0315   ALKPHOS 95 02/22/2019 0315   BILITOT 0.3 02/22/2019 0315   GFRNONAA >60 02/22/2019 0315   GFRAA >60 02/22/2019 0315    GFR Estimated Creatinine Clearance: 107.1 mL/min (by C-G formula based on SCr of 0.89 mg/dL). No results for input(s): LIPASE, AMYLASE in the last 72 hours. No results for input(s): CKTOTAL, CKMB, CKMBINDEX, TROPONINI in the last 72 hours. Invalid input(s): POCBNP Recent Labs    02/21/19 0400 02/22/19 0315  DDIMER 0.40 0.59*   No results for input(s): HGBA1C in the last 72 hours. No results for input(s): CHOL, HDL, LDLCALC, TRIG, CHOLHDL, LDLDIRECT in the last 72 hours. No results for input(s): TSH, T4TOTAL, T3FREE, THYROIDAB in the last 72 hours.  Invalid input(s): FREET3 Recent Labs    02/21/19 0400 02/22/19 0355  FERRITIN 126 111   Coags: No results for input(s): INR in the last 72 hours.  Invalid input(s): PT Microbiology: Recent Results (from the past 240 hour(s))  Blood Culture (routine x 2)     Status: None   Collection Time: 02/12/19  4:24 PM  Result Value Ref Range Status   Specimen Description   Final    BLOOD LEFT FOREARM Performed at [facility], [place] [address], [place], [place] [address]    Special Requests   Final    BOTTLES DRAWN AEROBIC AND ANAEROBIC Blood Culture adequate volume Performed at [facility] [address], [place], [place] [address]    Culture   Final    NO GROWTH 5 DAYS Performed at [facility], [place] [address] [place], [place] [address]    Report Status 02/17/2019 FINAL  Final  Blood Culture (routine x 2)     Status: Abnormal   Collection Time: 02/12/19  4:53 PM  Result Value Ref Range Status   Specimen Description   Final    BLOOD RIGHT ANTECUBITAL Performed at [facility] [address], [place], [place] [address]    Special Requests   Final    BOTTLES DRAWN AEROBIC AND ANAEROBIC Blood Culture adequate volume Performed at [facility] [address], [place], [place] [address]    Culture  Setup Time   Final    GRAM POSITIVE COCCI IN CLUSTERS ANAEROBIC BOTTLE ONLY CRITICAL RESULT CALLED TO, READ BACK BY AND VERIFIED WITH: C. SHADE, PHARMD ([place]) AT 1410 ON 02/13/19 BY C. JESSUP, MLT.    Culture (A)  Final    STAPHYLOCOCCUS SPECIES (COAGULASE NEGATIVE) THE SIGNIFICANCE OF ISOLATING THIS ORGANISM FROM A SINGLE SET OF BLOOD CULTURES WHEN MULTIPLE SETS ARE DRAWN IS UNCERTAIN. PLEASE NOTIFY THE MICROBIOLOGY DEPARTMENT WITHIN ONE WEEK IF SPECIATION AND SENSITIVITIES ARE REQUIRED. Performed at [facility], [place] [address], [place], [place] [address]    Report Status 02/15/2019 FINAL  Final  Blood Culture ID Panel (Reflexed)     Status: Abnormal   Collection Time: 02/12/19  4:53 PM  Result Value Ref Range Status   Enterococcus species NOT DETECTED NOT DETECTED Final  Listeria monocytogenes NOT DETECTED NOT DETECTED Final   Staphylococcus species DETECTED (A) NOT DETECTED Final    Comment: Methicillin (oxacillin) resistant coagulase negative staphylococcus. Possible blood culture contaminant (unless isolated from more than one blood culture draw or clinical case suggests pathogenicity). No antibiotic treatment is indicated for blood  culture contaminants. CRITICAL RESULT CALLED TO, READ BACK BY  AND VERIFIED WITH: C. SHADE, PHARMD ([place]) AT 1410 ON 02/13/19 BY C. JESSUP, MLT.    Staphylococcus aureus (BCID) NOT DETECTED NOT DETECTED Final   Methicillin resistance DETECTED (A) NOT DETECTED Final    Comment: CRITICAL RESULT CALLED TO, READ BACK BY AND VERIFIED WITH: C. SHADE, PHARMD ([place]) AT 1410 ON 02/13/19 BY C. JESSUP, MLT.    Streptococcus species NOT DETECTED NOT DETECTED Final   Streptococcus agalactiae NOT DETECTED NOT DETECTED Final   Streptococcus pneumoniae NOT DETECTED NOT DETECTED Final   Streptococcus pyogenes NOT DETECTED NOT DETECTED Final   Acinetobacter baumannii NOT DETECTED NOT DETECTED Final   Enterobacteriaceae species NOT DETECTED NOT DETECTED Final   Enterobacter cloacae complex NOT DETECTED NOT DETECTED Final   Escherichia coli NOT DETECTED NOT DETECTED Final   Klebsiella oxytoca NOT DETECTED NOT DETECTED Final   Klebsiella pneumoniae NOT DETECTED NOT DETECTED Final   Proteus species NOT DETECTED NOT DETECTED Final   Serratia marcescens NOT DETECTED NOT DETECTED Final   Haemophilus influenzae NOT DETECTED NOT DETECTED Final   Neisseria meningitidis NOT DETECTED NOT DETECTED Final   Pseudomonas aeruginosa NOT DETECTED NOT DETECTED Final   Candida albicans NOT DETECTED NOT DETECTED Final   Candida glabrata NOT DETECTED NOT DETECTED Final   Candida krusei NOT DETECTED NOT DETECTED Final   Candida parapsilosis NOT DETECTED NOT DETECTED Final   Candida tropicalis NOT DETECTED NOT DETECTED Final    Comment: Performed at [facility], [place] [address], [place], [place] [address]  SARS Coronavirus 2 (CEPHEID- Performed in [place] hospital lab), Hosp Order     Status: Abnormal   Collection Time: 02/12/19  7:18 PM  Result Value Ref Range Status   SARS Coronavirus 2 POSITIVE (A) NEGATIVE Final    Comment: RESULT CALLED TO, READ BACK BY AND VERIFIED WITH: O.IDOWU AT 2042 ON 02/12/19 BY N.THOMPSON (NOTE) If result is NEGATIVE SARS-CoV-2 target nucleic acids  are NOT DETECTED. The SARS-CoV-2 RNA is generally detectable in upper and lower  respiratory specimens during the acute phase of infection. The lowest  concentration of SARS-CoV-2 viral copies this assay can detect is 250  copies / mL. A negative result does not preclude SARS-CoV-2 infection  and should not be used as the sole basis for treatment or other  patient management decisions.  A negative result may occur with  improper specimen collection / handling, submission of specimen other  than nasopharyngeal swab, presence of viral mutation(s) within the  areas targeted by this assay, and inadequate number of viral copies  (<250 copies / mL). A negative result must be combined with clinical  observations, patient history, and epidemiological information. If result is POSITIVE SARS-CoV-2 target nucleic acids are DETECTE D. The SARS-CoV-2 RNA is generally detectable in upper and lower  respiratory specimens during the acute phase of infection.  Positive  results are indicative of active infection with SARS-CoV-2.  Clinical  correlation with patient history and other diagnostic information is  necessary to determine patient infection status.  Positive results do  not rule out bacterial infection or co-infection with other viruses. If result is PRESUMPTIVE POSTIVE SARS-CoV-2  nucleic acids MAY BE PRESENT.   A presumptive positive result was obtained on the submitted specimen  and confirmed on repeat testing.  While 2019 novel coronavirus  (SARS-CoV-2) nucleic acids may be present in the submitted sample  additional confirmatory testing may be necessary for epidemiological  and / or clinical management purposes  to differentiate between  SARS-CoV-2 and other Sarbecovirus currently known to infect humans.  If clinically indicated additional testing with an alternate test  methodology (LAB745 3) is advised. The SARS-CoV-2 RNA is generally  detectable in upper and lower respiratory specimens  during the acute  phase of infection. The expected result is Negative. Fact Sheet for Patients:  StrictlyIdeas.no Fact Sheet for Healthcare Providers: BankingDealers.co.za This test is not yet approved or cleared by the [place] FDA and has been authorized for detection and/or diagnosis of SARS-CoV-2 by FDA under an Emergency Use Authorization (EUA).  This EUA will remain in effect (meaning this test can be used) for the duration of the COVID-19 declaration under Section 564(b)(1) of the Act, 21 U.S.C. section 360bbb-3(b)(1), unless the authorization is terminated or revoked sooner. Performed at [facility], [place] [address], [place], [place] [address]   Culture, Urine     Status: None   Collection Time: 02/12/19  8:40 PM  Result Value Ref Range Status   Specimen Description   Final    Urine Performed at [facility] [address] [place], [place] [address]    Special Requests   Final    NONE Performed at [facility], [place] [address] [place], [place] [address]    Culture   Final    NO GROWTH Performed at [facility], [place] [address], [place], [place] [address]    Report Status 02/14/2019 FINAL  Final  MRSA PCR Screening     Status: None   Collection Time: 02/13/19  9:00 AM  Result Value Ref Range Status   MRSA by PCR NEGATIVE NEGATIVE Final    Comment:        The GeneXpert MRSA Assay (FDA approved for NASAL specimens only), is one component of a comprehensive MRSA colonization surveillance program. It is not intended to diagnose MRSA infection nor to guide or monitor treatment for MRSA infections. Performed at [facility], [place] [address] [place], [place] [address]     FURTHER DISCHARGE INSTRUCTIONS:  Get Medicines reviewed and adjusted: Please take all your medications with you for your next visit with your Primary  MD  Laboratory/radiological data: Please request your Primary MD to go over all hospital tests and procedure/radiological results at the follow up, please ask your Primary MD to get all Hospital records sent to his/her office.  In some cases, they will be blood work, cultures and biopsy results pending at the time of your discharge. Please request that your primary care M.D. goes through all the records of your hospital data and follows up on these results.  Also Note the following: If you experience worsening of your admission symptoms, develop shortness of breath, life threatening emergency, suicidal or homicidal thoughts you must seek medical attention immediately by calling 911 or calling your MD immediately  if symptoms less severe.  You must read complete instructions/literature along with all the possible adverse reactions/side effects for all the Medicines you take and that have been prescribed to you. Take any new Medicines after you have completely understood and accpet all the possible adverse reactions/side effects.  Do not drive when taking Pain medications or sleeping medications (Benzodaizepines)  Do not take more than prescribed Pain, Sleep and Anxiety Medications. It is not advisable to combine anxiety,sleep and pain medications without talking with your primary care practitioner  Special Instructions: If you have smoked or chewed Tobacco  in the last 2 yrs please stop smoking, stop any regular Alcohol  and or any Recreational drug use.  Wear Seat belts while driving.  Please note: You were cared for by a hospitalist during your hospital stay. Once you are discharged, your primary care physician will handle any further medical issues. Please note that NO REFILLS for any discharge medications will be authorized once you are discharged, as it is imperative that you return to your primary care physician (or establish a relationship with a primary care physician if you do not have  one) for your post hospital discharge needs so that they can reassess your need for medications and monitor your lab values.  Total Time spent coordinating discharge including counseling, education and face to face time equals 35 minutes.  SignedOren Binet 02/22/2019 9:54 AM

## 2019-02-22 NOTE — Progress Notes (Addendum)
Transport has all pt belongings. No IVs, all removed

## 2019-02-22 NOTE — Progress Notes (Signed)
Unable to reach staffing (RN) to give report.  Tired on two occassions via phone placed on hold for at least 60mins on one occasion   Pt A/Ox4, 4L N/C. Pt has been bathed and wounds have cleansed and changed according to order.Marland Kitchen LBM 02/22/19 x2. Last CBG 245. K. 3.3 administered K po 20 meq. Assist pt with moderated bed assist. Encouraged pt on meds, discharge instructions. Pt has all belongings

## 2019-02-22 NOTE — Plan of Care (Signed)
  Problem: Education: Goal: Knowledge of risk factors and measures for prevention of condition will improve Outcome: Progressing   Problem: Respiratory: Goal: Will maintain a patent airway Outcome: Progressing Goal: Complications related to the disease process, condition or treatment will be avoided or minimized Outcome: Progressing   

## 2019-02-22 NOTE — Progress Notes (Signed)
Unable to contact family members and SNF to inform them pt is discharging.

## 2019-02-22 NOTE — TOC Transition Note (Addendum)
Transition of Care [facility]) - CM/SW Discharge Note   Patient Details  Name: Delmer Kowalski MRN: [id number] Date of Birth: November 05, 1955  Transition of Care [facility] CM/SW Contact:  Alberteen Sam, [place] Phone Number: [phone] 02/22/2019, 12:47 PM   Clinical Narrative:     Patient will DC to: [facility] Anticipated DC date: 02/22/2019 Family notified: [place]han - no answer lvm Transport by: Corey Harold  Per MD patient ready for DC to [facility], patient, patient's family, and facility notified of DC. Discharge Summary sent to facility. RN given number for report [phone]. DC packet on chart. Ambulance transport requested for patient for 3:00 pm.  CSW signing off.  [place], [place]   Final next level of care: Skilled Nursing Facility Barriers to Discharge: No Barriers Identified   Patient Goals and CMS Choice   CMS Medicare.gov Compare Post Acute Care list provided to:: Patient Represenative (must comment)(Turhan (brother)) Choice offered to / list presented to : Sibling(Turhan)  Discharge Placement PASRR number recieved: 02/22/19            Patient [facility] bed at: [facility] Patient to be transferred to facility by: [place] Name of family member notified: Turhan Patient and family notified of of transfer: 02/22/19  Discharge Plan and Services     Post Acute Care Choice: [facility]                               Social Determinants of Health (SDOH) Interventions     Readmission Risk Interventions No flowsheet data found.

## 2019-02-22 NOTE — NC FL2 (Signed)
[place] MEDICAID FL2 LEVEL OF CARE SCREENING TOOL     IDENTIFICATION  Patient Name: [name] Birthdate: [birth year] Sex: male Admission Date (Current Location): 02/12/2019  [facility] and [place] Number:  Herbalist and Address:  [facility] [place] [address] [place], [place])      Provider Number: [id number]  Attending Physician Name and Address:  Jonetta Osgood, MD  Relative Name and Phone Number:  Georgina Quint (brother) [phone]    Current Level of Care: Hospital Recommended Level of Care: [facility] Prior Approval Number:    Date Approved/Denied: 03/17/15 PASRR Number: [id number]  Discharge Plan: SNF    Current Diagnoses: Patient Active Problem List   Diagnosis Date Noted  . Dyspnea 02/12/2019  . Pain in left wrist 11/03/2017  . Acute diastolic heart failure ([place]) 08/25/2016  . Hypoxia 08/21/2016  . Acute respiratory failure with hypoxia and hypercapnia ([place]) 08/21/2016  . Moderate protein-calorie malnutrition ([place]) 08/21/2016  . Stasis edema of both lower extremities 08/21/2016  . Chronic pain 08/21/2016  . AKI (acute kidney injury) ([place]) 11/02/2015  . Hyperkalemia 11/02/2015  . Hypoglycemia 11/02/2015  . Cellulitis   . Leg ulcer ([place])   . Nonhealing ulcer of left lower extremity ([place])   . Pain of left leg   . Cellulitis of leg 04/25/2015  . Degenerative arthritis of hip 04/25/2015  . Cellulitis and abscess of leg   . Microcytic anemia 03/27/2015  . Hypertension   . Fever   . DM (diabetes mellitus), secondary, uncontrolled, with peripheral vascular complications ([place]) 34/74/2595  . MSSA (methicillin susceptible Staphylococcus aureus) infection 03/20/2015  . Leukocytosis 03/20/2015  . Anemia, iron deficiency 03/20/2015  . Cellulitis of left lower extremity   . Pyomyositis     Orientation RESPIRATION BLADDER Height & Weight     Self  O2(4L nasal canula) Incontinent, External  catheter Weight: 247 lb 12.8 oz (112.4 kg) Height:  6' (182.9 cm)  BEHAVIORAL SYMPTOMS/MOOD NEUROLOGICAL BOWEL NUTRITION STATUS      Continent Diet(see discharge summary)  AMBULATORY STATUS COMMUNICATION OF NEEDS Skin   Extensive Assist Verbally PU Stage and Appropriate Care, Other (Comment)(stage 2 sacrum; cellulitis bilateral legs; ulcer on left leg with foam and gauze dressing)                       Personal Care Assistance Level of Assistance  Bathing, Feeding, Dressing Bathing Assistance: Maximum assistance Feeding assistance: Limited assistance Dressing Assistance: Maximum assistance     Functional Limitations Info  Sight, Hearing, Speech Sight Info: Adequate Hearing Info: Adequate Speech Info: Adequate    SPECIAL CARE FACTORS FREQUENCY  PT (By licensed PT), OT (By licensed OT)     PT Frequency: 5x week OT Frequency: 5x week            Contractures Contractures Info: Not present    Additional Factors Info  Isolation Precautions, Code Status, Allergies Code Status Info: Full Allergies Info: Jehovah witness, no blood products. Sulfa antibiotics     Isolation Precautions Info: air and contact precautions, emerging pathogen      Current Medications (02/22/2019):  This is the current hospital active medication list Current Facility-Administered Medications  Medication Dose Route Frequency Provider Last Rate Last Dose  . 0.9 %  sodium chloride infusion   Intravenous PRN Patrecia Pour, MD 10 mL/hr at 02/22/19 1000    . acetaminophen (TYLENOL) tablet 650 mg  650 mg Oral Q6H PRN  Bethena Roys, MD   650 mg at 02/21/19 0931  . albuterol (VENTOLIN HFA) 108 (90 Base) MCG/ACT inhaler 2 puff  2 puff Inhalation Q4H PRN Emokpae, Ejiroghene E, MD      . amitriptyline (ELAVIL) tablet 100 mg  100 mg Oral QHS Emokpae, Ejiroghene E, MD   100 mg at 02/22/19 0105  . aspirin EC tablet 81 mg  81 mg Oral BH-q7a Emokpae, Ejiroghene E, MD   81 mg at 02/22/19 0602  .  atorvastatin (LIPITOR) tablet 80 mg  80 mg Oral Daily Emokpae, Ejiroghene E, MD   80 mg at 02/22/19 0937  . carvedilol (COREG) tablet 3.125 mg  3.125 mg Oral BID WC Emokpae, Ejiroghene E, MD   3.125 mg at 02/22/19 0814  . dextrose 50 % solution 50 mL  1 ampule Intravenous PRN Patrecia Pour, MD   50 mL at 02/16/19 0046  . DULoxetine (CYMBALTA) DR capsule 40 mg  40 mg Oral Daily Emokpae, Ejiroghene E, MD   40 mg at 02/22/19 0911  . enoxaparin (LOVENOX) injection 60 mg  60 mg Subcutaneous Q12H Jonetta Osgood, MD   60 mg at 02/22/19 3382  . feeding supplement (ENSURE ENLIVE) (ENSURE ENLIVE) liquid 237 mL  237 mL Oral Q24H Jonetta Osgood, MD   237 mL at 02/21/19 2030  . feeding supplement (PRO-STAT SUGAR FREE 64) liquid 30 mL  30 mL Oral TID WC Ghimire, Henreitta Leber, MD   30 mL at 02/22/19 1142  . guaiFENesin-dextromethorphan (ROBITUSSIN DM) 100-10 MG/5ML syrup 5 mL  5 mL Oral Q4H PRN Jonetta Osgood, MD   5 mL at 02/21/19 2338  . insulin aspart (novoLOG) injection 0-15 Units  0-15 Units Subcutaneous Q4H Emokpae, Ejiroghene E, MD   5 Units at 02/22/19 1147  . insulin glargine (LANTUS) injection 20 Units  20 Units Subcutaneous Daily Jonetta Osgood, MD   20 Units at 02/22/19 1009  . mirtazapine (REMERON) tablet 15 mg  15 mg Oral QHS Patrecia Pour, MD   15 mg at 02/21/19 2334  . nutrition supplement (JUVEN) (JUVEN) powder packet 1 packet  1 packet Oral BID BM Jonetta Osgood, MD   1 packet at 02/22/19 [phone]  . oxyCODONE (Oxy IR/ROXICODONE) immediate release tablet 5 mg  5 mg Oral TID PRN Patrecia Pour, MD   5 mg at 02/22/19 1022  . oxyCODONE (OXYCONTIN) 12 hr tablet 10 mg  10 mg Oral BID Jonetta Osgood, MD   10 mg at 02/22/19 0817  . pantoprazole (PROTONIX) EC tablet 40 mg  40 mg Oral Daily Patrecia Pour, MD   40 mg at 02/22/19 9767  . polyethylene glycol (MIRALAX / GLYCOLAX) packet 17 g  17 g Oral Daily PRN Emokpae, Ejiroghene E, MD      . polyvinyl alcohol (LIQUIFILM TEARS) 1.4 %  ophthalmic solution 2 drop  2 drop Both Eyes PRN Jonetta Osgood, MD      . Derrill Memo ON 02/23/2019] potassium chloride SA (K-DUR) CR tablet 20 mEq  20 mEq Oral Daily Ghimire, Shanker M, MD      . tamsulosin (FLOMAX) capsule 0.4 mg  0.4 mg Oral QPC supper Vance Gather B, MD   0.4 mg at 02/21/19 1700  . tiZANidine (ZANAFLEX) tablet 4 mg  4 mg Oral Q8H PRN Patrecia Pour, MD   4 mg at 02/16/19 3419  . torsemide (DEMADEX) tablet 20 mg  20 mg Oral Daily Ghimire, Henreitta Leber, MD  20 mg at 02/22/19 6219     Discharge Medications: Please see discharge summary for a list of discharge medications.  Relevant Imaging Results:  Relevant Lab Results:   Additional Information SS#: [id number]  [place], [place]

## 2019-02-22 NOTE — Plan of Care (Signed)
  Problem: Education: Goal: Knowledge of risk factors and measures for prevention of condition will improve 02/22/2019 1115 by Blinda Leatherwood, RN Outcome: Completed/Met 02/22/2019 1114 by Lysbeth Penner D, RN Outcome: Progressing   Problem: Respiratory: Goal: Will maintain a patent airway 02/22/2019 1115 by Lysbeth Penner D, RN Outcome: Completed/Met 02/22/2019 1114 by Lysbeth Penner D, RN Outcome: Progressing Goal: Complications related to the disease process, condition or treatment will be avoided or minimized 02/22/2019 1115 by Blinda Leatherwood, RN Outcome: Completed/Met 02/22/2019 1114 by Blinda Leatherwood, RN Outcome: Progressing

## 2019-04-05 DIAGNOSIS — F29 Unspecified psychosis not due to a substance or known physiological condition: Secondary | ICD-10-CM | POA: Insufficient documentation

## 2019-04-06 DIAGNOSIS — J9611 Chronic respiratory failure with hypoxia: Secondary | ICD-10-CM | POA: Insufficient documentation

## 2019-04-06 DIAGNOSIS — F329 Major depressive disorder, single episode, unspecified: Secondary | ICD-10-CM | POA: Insufficient documentation

## 2019-04-06 DIAGNOSIS — E785 Hyperlipidemia, unspecified: Secondary | ICD-10-CM | POA: Insufficient documentation

## 2020-01-25 ENCOUNTER — Other Ambulatory Visit (HOSPITAL_COMMUNITY): Payer: Self-pay | Admitting: Internal Medicine

## 2020-01-25 DIAGNOSIS — L02214 Cutaneous abscess of groin: Secondary | ICD-10-CM

## 2020-01-31 ENCOUNTER — Other Ambulatory Visit: Payer: Self-pay

## 2020-01-31 ENCOUNTER — Ambulatory Visit (HOSPITAL_COMMUNITY)
Admission: RE | Admit: 2020-01-31 | Discharge: 2020-01-31 | Disposition: A | Discharge status: Home / Self Care | Payer: Medicare Other | Source: Ambulatory Visit | Attending: Internal Medicine | Admitting: Internal Medicine

## 2020-01-31 DIAGNOSIS — L02214 Cutaneous abscess of groin: Secondary | ICD-10-CM | POA: Insufficient documentation

## 2020-01-31 LAB — COMPREHENSIVE METABOLIC PANEL
ALT: 27 U/L (ref 0–44)
AST: 37 U/L (ref 15–41)
Albumin: 3 g/dL — ABNORMAL LOW (ref 3.5–5.0)
Alkaline Phosphatase: 88 U/L (ref 38–126)
Anion gap: 10 (ref 5–15)
BUN: 14 mg/dL (ref 8–23)
CO2: 28 mmol/L (ref 22–32)
Calcium: 8.9 mg/dL (ref 8.9–10.3)
Chloride: 100 mmol/L (ref 98–111)
Creatinine, Ser: 1 mg/dL (ref 0.61–1.24)
GFR calc Af Amer: >60 mL/min (ref >60)
GFR calc non Af Amer: >60 mL/min (ref >60)
Glucose, Bld: 269 mg/dL — ABNORMAL HIGH (ref 70–99)
Potassium: 4.6 mmol/L (ref 3.5–5.1)
Sodium: 138 mmol/L (ref 135–145)
Total Bilirubin: 0.8 mg/dL (ref 0.3–1.2)
Total Protein: 6.9 g/dL (ref 6.5–8.1)

## 2020-01-31 LAB — CBC WITH DIFFERENTIAL/PLATELET
Abs Immature Granulocytes: 0.02 10*3/uL (ref 0.00–0.07)
Basophils Absolute: 0 10*3/uL (ref 0.0–0.1)
Basophils Relative: 1 %
Eosinophils Absolute: 0.2 10*3/uL (ref 0.0–0.5)
Eosinophils Relative: 3 %
HCT: 40 % (ref 39.0–52.0)
Hemoglobin: 11.1 g/dL — ABNORMAL LOW (ref 13.0–17.0)
Immature Granulocytes: 0 %
Lymphocytes Relative: 25 %
Lymphs Abs: 1.3 10*3/uL (ref 0.7–4.0)
MCH: 19.3 pg — ABNORMAL LOW (ref 26.0–34.0)
MCHC: 27.8 g/dL — ABNORMAL LOW (ref 30.0–36.0)
MCV: 69.7 fL — ABNORMAL LOW (ref 80.0–100.0)
Monocytes Absolute: 0.4 10*3/uL (ref 0.1–1.0)
Monocytes Relative: 8 %
Neutro Abs: 3.4 10*3/uL (ref 1.7–7.7)
Neutrophils Relative %: 63 %
Platelets: 218 10*3/uL (ref 150–400)
RBC: 5.74 MIL/uL (ref 4.22–5.81)
RDW: 20.3 % — ABNORMAL HIGH (ref 11.5–15.5)
WBC: 5.3 10*3/uL (ref 4.0–10.5)
nRBC: 0 % (ref 0.0–0.2)

## 2020-01-31 LAB — POCT I-STAT CREATININE: Creatinine, Ser: 0.9 mg/dL (ref 0.61–1.24)

## 2020-01-31 LAB — SEDIMENTATION RATE: Sed Rate: 17 mm/hr — ABNORMAL HIGH (ref 0–16)

## 2020-01-31 MED ORDER — IOHEXOL 300 MG/ML  SOLN
100.0000 mL | Freq: Once | INTRAMUSCULAR | Status: AC | PRN
  Administered 2020-01-31: 11:00:00 100 mL via INTRAVENOUS

## 2020-02-25 ENCOUNTER — Other Ambulatory Visit (HOSPITAL_COMMUNITY)
Admission: RE | Admit: 2020-02-25 | Discharge: 2020-02-25 | Disposition: A | Discharge status: Home / Self Care | Payer: Medicare Other | Source: Other Acute Inpatient Hospital | Attending: Internal Medicine | Admitting: Internal Medicine

## 2020-02-25 ENCOUNTER — Encounter (HOSPITAL_BASED_OUTPATIENT_CLINIC_OR_DEPARTMENT_OTHER): Payer: Medicare Other | Attending: Internal Medicine | Admitting: Internal Medicine

## 2020-02-25 DIAGNOSIS — F17200 Nicotine dependence, unspecified, uncomplicated: Secondary | ICD-10-CM | POA: Insufficient documentation

## 2020-02-25 DIAGNOSIS — I110 Hypertensive heart disease with heart failure: Secondary | ICD-10-CM | POA: Insufficient documentation

## 2020-02-25 DIAGNOSIS — L97212 Non-pressure chronic ulcer of right calf with fat layer exposed: Secondary | ICD-10-CM | POA: Insufficient documentation

## 2020-02-25 DIAGNOSIS — I890 Lymphedema, not elsewhere classified: Secondary | ICD-10-CM | POA: Diagnosis not present

## 2020-02-25 DIAGNOSIS — E1140 Type 2 diabetes mellitus with diabetic neuropathy, unspecified: Secondary | ICD-10-CM | POA: Diagnosis not present

## 2020-02-25 DIAGNOSIS — E11622 Type 2 diabetes mellitus with other skin ulcer: Secondary | ICD-10-CM | POA: Diagnosis present

## 2020-02-25 DIAGNOSIS — L97222 Non-pressure chronic ulcer of left calf with fat layer exposed: Secondary | ICD-10-CM | POA: Diagnosis present

## 2020-02-25 DIAGNOSIS — E1151 Type 2 diabetes mellitus with diabetic peripheral angiopathy without gangrene: Secondary | ICD-10-CM | POA: Diagnosis not present

## 2020-02-25 DIAGNOSIS — M1990 Unspecified osteoarthritis, unspecified site: Secondary | ICD-10-CM | POA: Diagnosis not present

## 2020-02-25 DIAGNOSIS — Z882 Allergy status to sulfonamides status: Secondary | ICD-10-CM | POA: Diagnosis not present

## 2020-02-25 DIAGNOSIS — I87333 Chronic venous hypertension (idiopathic) with ulcer and inflammation of bilateral lower extremity: Secondary | ICD-10-CM | POA: Insufficient documentation

## 2020-02-25 DIAGNOSIS — I509 Heart failure, unspecified: Secondary | ICD-10-CM | POA: Insufficient documentation

## 2020-02-25 DIAGNOSIS — Z833 Family history of diabetes mellitus: Secondary | ICD-10-CM | POA: Insufficient documentation

## 2020-02-25 DIAGNOSIS — I872 Venous insufficiency (chronic) (peripheral): Secondary | ICD-10-CM | POA: Insufficient documentation

## 2020-02-25 DIAGNOSIS — Z8249 Family history of ischemic heart disease and other diseases of the circulatory system: Secondary | ICD-10-CM | POA: Insufficient documentation

## 2020-02-27 LAB — AEROBIC CULTURE W GRAM STAIN (SUPERFICIAL SPECIMEN): Gram Stain: NONE SEEN

## 2020-02-29 NOTE — Progress Notes (Signed)
[name] ([id number]) Visit Report for 02/25/2020 Allergy List Details [name], [name] 02/25/2020 10:30 A M Medical Record Number: [id number] Patient Account Number: [id number] Date of Birth/Sex: Treating RN: 08-16-1956 (64 y.o. [name]) [name] Primary Care Alexxus Sobh: [name] Other Clinician: Referring Francisco Eyerly: Treating [name] Casanas/Extender: Bayard Hugger Weeks in Treatment: 0 Allergies Active Allergies Sulfa (Sulfonamide Antibiotics) Reaction: sweating,chils, body aches Severity: Severe Allergy Notes Electronic Signature(s) Signed: 02/29/2020 4:34:33 PM By: [name] Coria RN Entered By: [name] on 02/25/2020 11:15:45 -------------------------------------------------------------------------------- [place] Details [name], [name] 02/25/2020 10:30 A M Medical Record Number: [id number] Patient Account Number: [id number] Date of Birth/Sex: Treating RN: Feb 24, 1956 (64 y.o. [name]) [name] Primary Care [name]: [name] Other Clinician: Referring Cleburn Maiolo: Treating Sonny Poth/Extender: [name] in Treatment: 0 Visit Information Patient Arrived: Wheel Chair Arrival Time: 11:12 Accompanied By: self Transfer Assistance: None Patient Identification Verified: Yes Secondary Verification Process Completed: Yes Patient Requires Transmission-Based Precautions: No Patient Has Alerts: No History Since Last Visit All ordered tests and consults were completed: No Added or deleted any medications: No Any new allergies or adverse reactions: No Had a fall or experienced change in activities of daily living that may affect risk of falls: No Signs or symptoms of abuse/neglect since last visito No Hospitalized since last visit: No Implantable device outside of the clinic excluding cellular tissue based products placed in the center since last visit: No Electronic  Signature(s) Signed: 02/29/2020 4:34:33 PM By: [name] Coria RN Entered By: [name] on 02/25/2020 11:14:04 -------------------------------------------------------------------------------- [name], [name] 02/25/2020 10:30 A M Medical Record Number: [id number] Patient Account Number: [id number] Date of Birth/Sex: Treating RN: 1956/03/04 (64 y.o. [name] Primary Care Janet Decesare: [name] Other Clinician: Referring Jos Cygan: Treating [name]/Extender: [name] in Treatment: 0 Clinic Level of Care Assessment Items TOOL 1 Quantity Score X- 1 0 Use when EandM and Procedure is performed on INITIAL visit ASSESSMENTS - Nursing Assessment / Reassessment X- 1 20 General Physical Exam (combine w/ comprehensive assessment (listed just below) when performed on new pt. evals) X- 1 25 Comprehensive Assessment (HX, ROS, Risk Assessments, Wounds Hx, etc.) ASSESSMENTS - Wound and Skin Assessment / Reassessment X- 1 10 Dermatologic / Skin Assessment (not related to wound area) ASSESSMENTS - Ostomy and/or Continence Assessment and Care []  - 0 Incontinence Assessment and Management []  - 0 Ostomy Care Assessment and Management (repouching, etc.) PROCESS - Coordination of Care X - Simple Patient / Family Education for ongoing care 1 15 []  - 0 Complex (extensive) Patient / Family Education for ongoing care X- 1 10 Staff obtains Programmer, systems, Records, T Results / Process Orders est X- 1 10 Staff telephones HHA, Nursing Homes / Clarify orders / etc []  - 0 Routine Transfer to another Facility (non-emergent condition) []  - 0 Routine Hospital Admission (non-emergent condition) X- 1 15 New Admissions / Biomedical engineer / Ordering NPWT Apligraf, etc. , []  - 0 Emergency Hospital Admission (emergent condition) PROCESS - Special Needs []  - 0 Pediatric / Minor Patient  Management []  - 0 Isolation Patient Management []  - 0 Hearing / Language / Visual special needs []  - 0 Assessment of Community assistance (transportation, D/C planning, etc.) []  - 0 Additional assistance / Altered mentation []  - 0 Support Surface(s) Assessment (bed, cushion, seat, etc.) INTERVENTIONS - Miscellaneous []  - 0 External ear exam []  -  0 Patient Transfer (multiple staff / Civil Service fast streamer / Similar devices) []  - 0 Simple Staple / Suture removal (25 or less) []  - 0 Complex Staple / Suture removal (26 or more) []  - 0 Hypo/Hyperglycemic Management (do not check if billed separately) []  - 0 Ankle / Brachial Index (ABI) - do not check if billed separately Has the patient been seen at the hospital within the last three years: Yes Total Score: 105 Level Of Care: New/Established - Level 3 Electronic Signature(s) Signed: 02/25/2020 4:23:17 PM By: Kela Millin Entered By: Kela Millin on 02/25/2020 12:14:08 -------------------------------------------------------------------------------- Compression Therapy Details [name], [name] 02/25/2020 10:30 A M Medical Record Number: [id number] Patient Account Number: [id number] Date of Birth/Sex: Treating RN: [birth year] (64 y.o. [name] Primary Care [name]: [name] Other Clinician: Referring [name]: Treating [place] [name]/Extender: [name] in Treatment: 0 Compression Therapy Performed for Wound Assessment: Wound #20 Left,Lateral Lower Leg Performed By: Clinician [name] Gouty, RN Compression Type: Rolena Infante Post Procedure Diagnosis Same as Pre-procedure Electronic Signature(s) Signed: 02/25/2020 4:23:17 PM By: Kela Millin Entered By: Kela Millin on 02/25/2020 12:13:32 -------------------------------------------------------------------------------- Compression Therapy Details [name], [name] 02/25/2020  10:30 A M Medical Record Number: [id number] Patient Account Number: [id number] Date of Birth/Sex: Treating RN: 08-31-1956 (64 y.o. [name] Primary Care [name]: [name] Other Clinician: Referring Isabell Bonafede: Treating Dontell Mian/Extender: [name] in Treatment: 0 Compression Therapy Performed for Wound Assessment: Wound #21 Left,Distal Lower Leg Performed By: Clinician [name] Gouty, RN Compression Type: Rolena Infante Post Procedure Diagnosis Same as Pre-procedure Electronic Signature(s) Signed: 02/25/2020 4:23:17 PM By: Kela Millin Entered By: Kela Millin on 02/25/2020 12:13:32 -------------------------------------------------------------------------------- Compression Therapy Details [name], [name] 02/25/2020 10:30 A M Medical Record Number: [id number] Patient Account Number: [id number] Date of Birth/Sex: Treating RN: 03-Oct-1955 (64 y.o. [name] Primary Care Lynn Sissel: [name] Other Clinician: Referring Riku Buttery: Treating Mirela Parsley/Extender: [name] in Treatment: 0 Compression Therapy Performed for Wound Assessment: Wound #22 Right,Medial Lower Leg Performed By: Clinician [name] Gouty, RN Compression Type: Rolena Infante Post Procedure Diagnosis Same as Pre-procedure Electronic Signature(s) Signed: 02/25/2020 4:23:17 PM By: Kela Millin Entered By: Kela Millin on 02/25/2020 12:13:32 -------------------------------------------------------------------------------- Encounter Discharge Information Details [name], [name] 02/25/2020 10:30 A M Medical Record Number: [id number] Patient Account Number: [id number] Date of Birth/Sex: Treating RN: 13-Feb-1956 (64 y.o. [name] Primary Care Shaughnessy Gethers: [name] Other Clinician: Referring Jessenya Berdan: Treating Quaneshia Wareing/Extender: [name] in Treatment: 0 Encounter Discharge Information Items Discharge Condition: Stable Ambulatory Status: Wheelchair Discharge Destination: [place] Telephoned: No Orders Sent: Yes Transportation: Other Accompanied By: facility staff Schedule Follow-up Appointment: Yes Clinical Summary of Care: Patient Declined Notes facility trANSportation Electronic Signature(s) Signed: 02/25/2020 4:22:54 PM By: [name] Gouty RN, BSN Entered By: [name] on 02/25/2020 16:20:02 -------------------------------------------------------------------------------- Lower Extremity Assessment Details [name], [name] 02/25/2020 10:30 A M Medical Record Number: [id number] Patient Account Number: [id number] Date of Birth/Sex: Treating RN: February 28, 1956 (64 y.o. [name] Primary Care Jeyli Zwicker: [name] Other Clinician: Referring Bellamie Turney: Treating Reianna Batdorf/Extender: Bayard Hugger Weeks in Treatment: 0 Edema Assessment Assessed: Shirlyn Goltz: No] [Right: No] [Left: Edema] [Right: :] Calf Left: Right: Point of Measurement: 52 cm From Medial Instep 40 cm 41 cm Ankle Left: Right: Point of Measurement: 10 cm From Medial  Instep 26 cm 27 cm Electronic Signature(s) Signed: 02/29/2020 4:34:33 PM By: [name] Coria RN Entered By: [name] on 02/25/2020 11:45:16 -------------------------------------------------------------------------------- Multi Wound Chart Details [name], [name] 02/25/2020 10:30 A M Medical Record Number: [id number] Patient Account Number: [id number] Date of Birth/Sex: Treating RN: May 23, 1956 (63 y.o. [name] Primary Care Izeah Vossler: [name] Other Clinician: Referring Avaleen Brownley: Treating Kesi Perrow/Extender: [name] in Treatment: 0 Vital Signs Height(in): 77 Pulse(bpm): 13 Weight(lbs): 254 Blood Pressure(mmHg):  160/82 Body Mass Index(BMI): 30 Temperature(F): 98.6 Respiratory Rate(breaths/min): 18 Photos: [20:No Photos Left, Lateral Lower Leg] [21:No Photos Left, Distal Lower Leg] [22:No Photos Right, Medial Lower Leg] Wound Location: [20:Gradually Appeared] [21:Gradually Appeared] [22:Gradually Appeared] Wounding Event: [20:Venous Leg Ulcer] [21:Diabetic Wound/Ulcer of the Lower] [22:Venous Leg Ulcer] Primary Etiology: [20:Anemia, Sleep Apnea, Congestive] [21:Extremity Anemia, Sleep Apnea, Congestive] [22:Anemia, Sleep Apnea, Congestive] Comorbid History: [20:Heart Failure, Hypertension, Peripheral Heart Failure, Hypertension, Peripheral Heart Failure, Hypertension, Peripheral Venous Disease, Type II Diabetes, Osteoarthritis, Neuropathy 11/22/2019] [21:Venous Disease, Type II Diabetes,  Osteoarthritis, Neuropathy 11/22/2019] [22:Venous Disease, Type II Diabetes, Osteoarthritis, Neuropathy 12/23/2019] Date Acquired: [20:0] [21:0] [22:0] Weeks of Treatment: [20:Open] [21:Open] [22:Open] Wound Status: [20:4.5x2.5x0.3] [21:16x12.5x0.6] [22:6.4x4.5x0.2] Measurements L x W x D (cm) [20:8.836] [21:157.08] [22:22.619] A (cm) : rea [20:2.651] [21:94.248] [22:4.524] Volume (cm) : [20:Full Thickness Without Exposed] [21:Grade 2] [22:Full Thickness Without Exposed] Classification: [20:Support Structures Medium] [21:Medium] [22:Support Structures Medium] Exudate Amount: [20:Serosanguineous] [21:Serosanguineous] [22:Serosanguineous] Exudate Type: [20:red, brown] [21:red, brown] [22:red, brown] Exudate Color: [20:N/A] [21:Well defined, not attached] [22:N/A] Wound Margin: [20:Large (67-100%)] [21:Large (67-100%)] [22:Small (1-33%)] Granulation Amount: [20:Red, Pink] [21:Red] [22:Red, Pink] Granulation Quality: [20:Small (1-33%)] [21:Small (1-33%)] [22:Large (67-100%)] Necrotic Amount: [20:Fat Layer (Subcutaneous Tissue)] [21:Fat Layer (Subcutaneous Tissue)] [22:Fat Layer (Subcutaneous Tissue)] Exposed  Structures: [20:Exposed: Yes Fascia: No Tendon: No Muscle: No Joint: No Bone: No None] [21:Exposed: Yes Fascia: No Tendon: No Muscle: No Joint: No Bone: No None] [22:Exposed: Yes Fascia: No Tendon: No Muscle: No Joint: No Bone: No None] Epithelialization: [20:Compression Therapy] [21:Compression Therapy] [22:Compression Therapy] Treatment Notes Electronic Signature(s) Signed: 02/25/2020 4:23:17 PM By: Kela Millin Signed: 02/28/2020 5:22:40 PM By: Linton Ham MD Entered By: Linton Ham on 02/25/2020 12:43:12 -------------------------------------------------------------------------------- Multi-Disciplinary Care Plan Details [name], [name] 02/25/2020 10:30 A M Medical Record Number: [id number] Patient Account Number: [id number] Date of Birth/Sex: Treating RN: 1956/08/26 (64 y.o. [name] Primary Care Edyn Qazi: [name] Other Clinician: Referring Jaylise Peek: Treating Tamer Baughman/Extender: [name] in Treatment: 0 Active Inactive Venous Leg Ulcer Nursing Diagnoses: Knowledge deficit related to disease process and management Goals: Patient/caregiver will verbalize understanding of disease process and disease management Date Initiated: 02/25/2020 Target Resolution Date: 04/14/2020 Goal Status: Active Interventions: Compression as ordered Provide education on venous insufficiency Notes: Wound/Skin Impairment Nursing Diagnoses: Impaired tissue integrity Goals: Ulcer/skin breakdown will have a volume reduction of 50% by week 8 Date Initiated: 02/25/2020 Target Resolution Date: 04/14/2020 Goal Status: Active Interventions: Provide education on ulcer and skin care Notes: Electronic Signature(s) Signed: 02/25/2020 4:23:17 PM By: Kela Millin Entered By: Kela Millin on 02/25/2020 12:08:02 -------------------------------------------------------------------------------- Pain Assessment  Details [name], [name] 02/25/2020 10:30 A M Medical Record Number: [id number] Patient Account Number: [id number] Date of Birth/Sex: Treating RN: May 07, 1956 (64 y.o. [name] Primary Care Shanina Kepple: [name] Other Clinician: Referring Bently Morath: Treating Emiline Mancebo/Extender: Bayard Hugger Weeks in Treatment: 0 Active Problems Location of Pain Severity and  Description of Pain Patient Has Paino Yes Site Locations With Dressing Change: Yes Duration of the Pain. Constant / Intermittento Intermittent How Long Does it Lasto Hours: Minutes: 10 Rate the pain. Current Pain Level: 2 Worst Pain Level: 5 Least Pain Level: 0 Tolerable Pain Level: 5 Character of Pain Describe the Pain: Aching Pain Management and Medication Current Pain Management: Medication: No Cold Application: No Rest: No Massage: No Activity: No T.E.N.S.: No Heat Application: No Leg drop or elevation: No Is the Current Pain Management Adequate: Inadequate How does your wound impact your activities of daily livingo Sleep: No Bathing: No Appetite: No Relationship With Others: No Bladder Continence: No Emotions: No Bowel Continence: No Work: No Toileting: No Drive: No Dressing: No Hobbies: No Electronic Signature(s) Signed: 02/29/2020 4:34:33 PM By: [name] Coria RN Entered By: [name] on 02/25/2020 11:54:15 -------------------------------------------------------------------------------- Patient/Caregiver Education Details Patient Name: Date of Service: Orma Flaming RD, [name] 6/4/2021andnbsp10:30 A M Medical Record Number: [id number] Patient Account Number: [id number] Date of Birth/Gender: Treating RN: 04-21-1956 (64 y.o. [name] Primary Care Physician: [name] Other Clinician: Referring Physician: Treating Physician/Extender: [name] in Treatment: 0 Education Assessment Education Provided  To: Patient Education Topics Provided Venous: Handouts: Controlling Swelling with Compression Stockings Methods: Explain/Verbal Responses: State content correctly Wound/Skin Impairment: Handouts: Caring for Your Ulcer Methods: Explain/Verbal Responses: State content correctly Electronic Signature(s) Signed: 02/25/2020 4:23:17 PM By: Kela Millin Entered By: Kela Millin on 02/25/2020 12:08:17 -------------------------------------------------------------------------------- Wound Assessment Details [name], [name] 02/25/2020 10:30 A M Medical Record Number: [id number] Patient Account Number: [id number] Date of Birth/Sex: Treating RN: [birth year] (64 y.o. [name]) [name] Primary Care Arianna Haydon: [name] Other Clinician: Referring Stacia Feazell: Treating Rhenda [place]/Extender: Bayard Hugger Weeks in Treatment: 0 Wound Status Wound Number: 20 Primary Venous Leg Ulcer Etiology: Wound Location: Left, Lateral Lower Leg Wound Open Wounding Event: Gradually Appeared Status: Date Acquired: 11/22/2019 Comorbid Anemia, Sleep Apnea, Congestive Heart Failure, Hypertension, Weeks Of Treatment: 0 History: Peripheral Venous Disease, Type II Diabetes, Osteoarthritis, Clustered Wound: No Neuropathy Photos Photo Uploaded By: Mikeal Hawthorne on 02/28/2020 09:28:47 Wound Measurements Length: (cm) 4.5 Width: (cm) 2.5 Depth: (cm) 0.3 Area: (cm) 8.836 Volume: (cm) 2.651 % Reduction in Area: % Reduction in Volume: Epithelialization: None Tunneling: No Undermining: No Wound Description Classification: Full Thickness Without Exposed Support Structures Exudate Amount: Medium Exudate Type: Serosanguineous Exudate Color: red, brown Foul Odor After Cleansing: No Slough/Fibrino Yes Wound Bed Granulation Amount: Large (67-100%) Exposed Structure Granulation Quality: Red, Pink Fascia Exposed: No Necrotic Amount: Small (1-33%) Fat Layer  (Subcutaneous Tissue) Exposed: Yes Necrotic Quality: Adherent Slough Tendon Exposed: No Muscle Exposed: No Joint Exposed: No Bone Exposed: No Treatment Notes Wound #20 (Left, Lateral Lower Leg) 2. Periwound Care Moisturizing lotion TCA Cream 3. Primary Dressing Applied Calcium Alginate Ag 4. Secondary Dressing ABD Pad Dry Gauze 6. Support Layer Best boy) Signed: 02/29/2020 4:34:33 PM By: [name] Coria RN Entered By: [name] on 02/25/2020 11:47:20 -------------------------------------------------------------------------------- Wound Assessment Details [name], [name] 02/25/2020 10:30 A M Medical Record Number: [id number] Patient Account Number: [id number] Date of Birth/Sex: Treating RN: 1956/09/13 (64 y.o. [name]) [name] Primary Care Michelena Culmer: [name] Other Clinician: Referring Ridhima Golberg: Treating Bryker Fletchall/Extender: Bayard Hugger Weeks in Treatment: 0 Wound Status Wound Number: 21 Primary Diabetic Wound/Ulcer of the Lower Extremity Etiology: Wound Location: Left, Distal Lower Leg Wound Open Wounding Event: Gradually Appeared Status: Date Acquired: 11/22/2019  Comorbid Anemia, Sleep Apnea, Congestive Heart Failure, Hypertension, Weeks Of Treatment: 0 History: Peripheral Venous Disease, Type II Diabetes, Osteoarthritis, Clustered Wound: No Neuropathy Photos Photo Uploaded By: Mikeal Hawthorne on 02/28/2020 09:29:04 Wound Measurements Length: (cm) 16 Width: (cm) 12.5 Depth: (cm) 0.6 Area: (cm) 157.08 Volume: (cm) 94.248 % Reduction in Area: % Reduction in Volume: Epithelialization: None Tunneling: No Undermining: No Wound Description Classification: Grade 2 Wound Margin: Well defined, not attached Exudate Amount: Medium Exudate Type: Serosanguineous Exudate Color: red, brown Foul Odor After Cleansing: No Slough/Fibrino Yes Wound Bed Granulation Amount: Large (67-100%)  Exposed Structure Granulation Quality: Red Fascia Exposed: No Necrotic Amount: Small (1-33%) Fat Layer (Subcutaneous Tissue) Exposed: Yes Necrotic Quality: Adherent Slough Tendon Exposed: No Muscle Exposed: No Joint Exposed: No Bone Exposed: No Electronic Signature(s) Signed: 02/29/2020 4:34:33 PM By: [name] Coria RN Entered By: [name] on 02/25/2020 11:51:14 -------------------------------------------------------------------------------- Wound Assessment Details [name], [name] 02/25/2020 10:30 A M Medical Record Number: [id number] Patient Account Number: [id number] Date of Birth/Sex: Treating RN: 1955/12/31 (64 y.o. [name]) [name] Primary Care Colbey Wirtanen: [name] Other Clinician: Referring Jeanne Diefendorf: Treating Jeraldean Wechter/Extender: Bayard Hugger Weeks in Treatment: 0 Wound Status Wound Number: 22 Primary Venous Leg Ulcer Etiology: Wound Location: Right, Medial Lower Leg Wound Open Wounding Event: Gradually Appeared Status: Date Acquired: 12/23/2019 Comorbid Anemia, Sleep Apnea, Congestive Heart Failure, Hypertension, Weeks Of Treatment: 0 History: Peripheral Venous Disease, Type II Diabetes, Osteoarthritis, Clustered Wound: No Neuropathy Photos Photo Uploaded By: Mikeal Hawthorne on 02/28/2020 09:29:19 Wound Measurements Length: (cm) 6.4 Width: (cm) 4.5 Depth: (cm) 0.2 Area: (cm) 22.619 Volume: (cm) 4.524 % Reduction in Area: % Reduction in Volume: Epithelialization: None Tunneling: No Undermining: No Wound Description Classification: Full Thickness Without Exposed Support Structures Exudate Amount: Medium Exudate Type: Serosanguineous Exudate Color: red, brown Foul Odor After Cleansing: No Slough/Fibrino Yes Wound Bed Granulation Amount: Small (1-33%) Exposed Structure Granulation Quality: Red, Pink Fascia Exposed: No Necrotic Amount: Large (67-100%) Fat Layer (Subcutaneous Tissue) Exposed:  Yes Necrotic Quality: Adherent Slough Tendon Exposed: No Muscle Exposed: No Joint Exposed: No Bone Exposed: No Treatment Notes Wound #22 (Right, Medial Lower Leg) 2. Periwound Care Moisturizing lotion TCA Cream 3. Primary Dressing Applied Calcium Alginate Ag 4. Secondary Dressing ABD Pad Dry Gauze 6. Support Layer Best boy) Signed: 02/29/2020 4:34:33 PM By: [name] Coria RN Entered By: [name] on 02/25/2020 11:53:05 -------------------------------------------------------------------------------- Vitals Details [name], [name] 02/25/2020 10:30 A M Medical Record Number: [id number] Patient Account Number: [id number] Date of Birth/Sex: Treating RN: 16-Dec-1955 (64 y.o. [name]) [name] Primary Care Kymir Coles: [name] Other Clinician: Referring Tashanti Dalporto: Treating [name]/Extender: [name] in Treatment: 0 Vital Signs Time Taken: 11:14 Temperature (F): 98.6 Height (in): 77 Pulse (bpm): 66 Source: Stated Respiratory Rate (breaths/min): 18 Weight (lbs): 254 Blood Pressure (mmHg): 160/82 Source: Stated Reference Range: 80 - 120 mg / dl Body Mass Index (BMI): 30.1 Electronic Signature(s) Signed: 02/29/2020 4:34:33 PM By: [name] Coria RN Entered By: [name] on 02/25/2020 11:15:38

## 2020-02-29 NOTE — Progress Notes (Signed)
Wilmer, Michail ([id number]) Visit Report for 02/25/2020 Chief Complaint Document Details [name], [name] 02/25/2020 10:30 A M Medical Record Number: [id number] Patient Account Number: [id number] Date of Birth/Sex: Treating RN: 1956/01/27 (64 y.o. [name] Primary Care Provider: [name] Other Clinician: Referring Provider: Treating Provider/Extender: [name] in Treatment: 0 Information Obtained from: Patient Chief Complaint Bilateral lower extremity wounds 02/25/2020; patient is back in clinic today for review of wounds on his left posterior greater than right lower extremity wounds Electronic Signature(s) Signed: 02/28/2020 5:22:40 PM By: Linton Ham MD Entered By: Linton Ham on 02/25/2020 12:43:43 -------------------------------------------------------------------------------- HPI Details [name], [name] 02/25/2020 10:30 A M Medical Record Number: [id number] Patient Account Number: [id number] Date of Birth/Sex: Treating RN: 09-Apr-1956 (64 y.o. [name] Primary Care Provider: [name] Other Clinician: Referring Provider: Treating Provider/Extender: [name] in Treatment: 0 History of Present Illness HPI Description: 07/07/15; this is a patient who is in hospital on 8/2 through 8/4. He had cellulitis and abscess of predominantly I think the left leg. He received IV antibiotics. Plain x-ray showed no osteomyelitis. An MRI of the left leg did not show osteomyelitis. Cultures showed no predominant organism. His hemoglobin A1c was 9.8. He has a history of venous stasis also peripheral vascular disease. He was discharged to [facility]. He really has extensive ulcerations on the left lateral leg including a major wound that communicates both posteriorly and superiorly w. When drainage coming out of 2 smaller areas. He has  a smaller wound on the posterior medial left leg. He has more predominantly venous insufficiency wounds on predominantly the right medial leg above the ankle the right foot into sections. He has recently been put on Augmentin at the nursing home. Previous ABIs/arterial evaluation showed triphasic waves diffusely he does not have a major ischemic issue a left lower extremity venous duplex also exam showed no evidence of a DVT on 6/21 07/21/15; the patient arrives with really no major change. Culture I did last time was negative. He has not had a follow-up MRI I ordered. The wounds are macerated covered with a thick gelatinous surface slough. There is necrotic subcutaneous tissue 08/04/15; the patient arrives with a considerable improvement in the majority of his wound area. The area on the left leg now has what looks to be a granulated base. Most of the wounds on the left leg required an aggressive surgical debridement to remove nonviable fibrinous eschar and subcutaneous tissue however after debridement most of this looks better. Although I had asked for repeat MRI of the left leg when he first came in here with grossly purulent material coming out of his wounds, it does not appear that this is been done and nor do I actually feel that strongly about it right now. He has severe surrounding venous insufficiency and inflammation. I don't believe he has significant PAD 08/25/15. In general there is still a considerable wound area here but with still extensive service adherent slough. The patient will not allow mechanical debridement due to pain. He did not tolerate Medihoney therefore we are left with Santyl for now. She would appear that he has severe surrounding venous stasis. There is no evidence of the infection that may have had something to do with the pathogenesis of these wounds 09/29/15; patient still has substantial wound area on the left leg with a cluster of several wounds on the lateral  leg  confluently on the left leg posteriorly and then a substantial wound on the medial leg. On the right leg a substantial wound medially and a small area on the right lateral foot. All of these underwent a substantial surgical debridement with curettes which she tolerated better than he has in the past. This started as a complex cellulitis in the face of chronic venous insufficiency and inflammation. 10/13/15; substantial cluster of wounds on the lateral aspect of his left leg confluently around to the other side. Extensive surgical debridement to remove for redness surface slough nonviable subcutaneous tissue. This is not an improvement. Also substantial wound on the medial right leg which is largely unchanged. The etiology of this was felt to be a complex cellulitis in the summer of 2016 in the face of chronic venous insufficiency and inflammation. The patient states that the wound on the right medial leg has been there for years off and on. 10/20/15; we are able to start [facility] to these extensive wound areas left greater than right on Tuesday after I spoke to the wound care nurse at the facility. He arrives here today for extensive surgical debridement for the wounds on the lateral aspect of his left leg posterior left leg, this is almost circumferential. He has a large wound on the medial aspect of his right leg although he finds this too painful for debridement 10/27/15; I continue to bring this patient back for frequent debridement/weekly debridement severe. We have been using Hydrofera Blue. Unfortunately this has not really had any improvement. The debridement surgery difficult and painful for the patient 11/23/15; this patient spent a complex hospitalization admitted with acute kidney injury, anemia, cellulitis of the lower extremity, he was felt to have sepsis pathophysiology although his blood cultures were negative. He had plain x-rays of both legs that were negative for osseous  abnormalities. He was seen by infectious disease and placed on a workup for vasculitis that was negative including a biopsy 4 all were consistent with stasis dermatitis. Vital broad- spectrum antibiotics he continued to spike fevers. Urine and chest x-ray were negative Dopplers on admission ruled out a DVT All antibiotics were stopped on . 2/17. Since his return to [facility] skilled nursing facility I believe they have been using Xeroform 12/07/15 the patient arrives today with the area on his right medial leg actually looking quite stable. No debridement. The rest of his extensive wounds on the left lateral left posterior extending into the left medial leg all required extensive debridement. I think this is probably going to need to and up in the hands of plastic surgery. We'll attempt to change him back to [facility]. Arrange consultation with plastic surgery at [facility] for this almost circumferential wound on the left side 12/21/15 right leg covered in surface slough. This was debridement. Left leg extensive wounds all carefully examined. This is almost circumferential on the left side especially on the posterior calf. No debridement is necessary. 01/04/16 the patient has been to [facility] plastic surgery. Unfortunately it doesn't look like they had any of the prior workup on this patient. They're in the middle of vascular workup. It sounds as though they're applying Hydrofera Blue at the nursing home. 01/22/16; the patient has been back to see [facility]. There apparently making plans to possibly do skin grafts over his large venous insufficiency ulcerations. The patient tells me that he goes to hematology in [facility] and variably uses the term platelets red cells and the description of his  problem. 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 I do not see any major difference in the wound on the right medial leg and circumferentially across the left  medial to left lateral leg. Did not attempt to debride these today. Readmission: 05/05/18 on evaluation today patient presents for reevaluation here in our clinic although I have previously seen him in the skilled nursing facility over the past year and a half when I was working in the skilled nursing facility realm instead of covering in the clinic. With that being said during that time we had initiated most recently dressings with Hydrofera Blue Dressing along with the [employer] which had done very well for him. Much better than the Kerlex Coban wraps. With that being said the patient had been doing fairly well and was making progress when I last saw him. Upon evaluation today it appears that the wounds are actually a little bit worse than when I last saw him although definitely not dramatically so. There does not appear to be any evidence of infection which is good news. With that being said he has been tolerating the wraps without complication his left hip still gives him a lot of trouble which is his main issue as far as movement is concerned. Unbeknownst to me he has not had venous studies that I can find. He previously told me he had there were arterial studies noted back from 04/27/15 which showed that he had try phasic blood flow throughout and again his test appear to be completely normal as performed by Dr. Creig Hines. With that being said I cannot find where he had venous studies. The patient still states he thinks he did these definitely had no venous intervention at this point. Upon evaluation today it does not appear to me that the patient has any evidence of cellulitis. He does have some chronic venous insufficiency which I think has led to stasis dermatitis but again this is not appearing to be infected at this point. No fevers, chills, nausea, or vomiting noted at this time. READMISSION 06/30/2018 This is a now 64 year old man we have had in this clinic at multiple times in the  past. Most recently he came in August and was seen by [facility] stone. It is not clear why he did not come back we asked him really did not get a straight answer. He is at [facility] skilled facility he is a man who has severe chronic venous insufficiency with lymphedema and has had severe wounds on his left greater than right leg. We had him here in 2016 and 17 ultimately referred him to [facility]. He had arterial studies and venous studies done at [facility] although I am not able to access these records I see they are actually done. He also had multiple biopsies that were negative for malignancy showing changes compatible with chronic inflammation. As I understand things in [place] they are using Iodoflex and Unna boots. I am not really sure how they are getting enough Iodoflex for the large wound area especially on his left calf. Nevertheless the wounds look better than when I saw this in 2017. There were plans for him to have plastic surgery in 2018 and I think he was actually prepped for surgery however he was ultimately denied because the patient was an active smoker. The patient is not systemically unwell. The wounds are painful however given the size especially on the left this is not surprising. ABIs  in this clinic were 0.78 on the left and 0.91 on the right 07/13/2018; this is a patient with bilateral severe venous ulcers with secondary lymphedema. The wounds are on the right medial calf and a substantial part of the left posterior calf and some involvement medially and laterally on the left. We changed him to silver alginate under Unna boot's last time. The wounds actually look fairly satisfactory today. 08/14/2018; this is a patient with severe bilateral venous stasis ulcers with secondary lymphedema left greater than right he is at [facility] using silver alginate under Unna boot's I do not think there is much change on this visit versus last time I saw him a month  ago Readmission: 11/04/18 patient presents today for follow-up concerning his bilateral lower extremity ulcers. His a right medial ankle ulcer and a left leg that has ulcers over a large proportion of the surface area between the ankle and knee. Unfortunately this does cause some discomfort for the patient although it doesn't seem to be as uncomfortable as it has been in the past. He is seen today for evaluation after a referral by [place] back to the clinic. Unfortunately has a lot of Slough noted on the surface of his wounds. He's been treated currently it sounds like with Dakin's soaked gauze and they have not been performing the Unna Boot wrap that was previously recommended. I'm not exactly sure what the reason for the change was. He does have less slough on the surface of the wound but again this is something that is often a constant issue for him. Again he's had these wounds for many years. I've known him for two years at least during that time when I was taking care of them in the facility he is Artie had the wounds for several years prior. READMISSION 02/25/2020 [name] is a now 64 year old man. He has been in our clinic several times before most extensively in October 2016 through May 2017. At that time he had absolutely substantial bilateral lower extremity wounds secondary to chronic venous insufficiency and lymphedema. We ultimately referred him to [facility] he had a series of biopsies only showed suggestion of wound secondary to chronic venous insufficiency. He had a less extensive stay in our clinic in 2019 and then a single visit in February 2020. He is a resident at [facility] skilled facility. I think he has had intermittently had in facility wound care. He returns today. They are using silver alginate on the wounds although I am not sure what type of compression. Previously he has favored Unna boots. He is not felt to have an arterial issue. Past medical history; lymphedema and  chronic venous insufficiency, diabetes mellitus, hypertension, congestive heart failure We did not do arterial studies today Electronic Signature(s) Signed: 02/28/2020 5:22:40 PM By: Linton Ham MD Entered By: Linton Ham on 02/25/2020 12:45:57 -------------------------------------------------------------------------------- Physical Exam Details [name], [name] 02/25/2020 10:30 A M Medical Record Number: [id number] Patient Account Number: [id number] Date of Birth/Sex: Treating RN: 08-05-1956 (64 y.o. [name] Primary Care Provider: [name] Other Clinician: Referring Provider: Treating Provider/Extender: [name] in Treatment: 0 Constitutional Patient is hypertensive.. Pulse regular and within target range for patient.Marland Kitchen Respirations regular, non-labored and within target range.. Temperature is normal and within the target range for the patient.Marland Kitchen Appears in no distress. Respiratory work of breathing is normal. Cardiovascular Pedal pulses are palpable. Severe bilateral changes of chronic venous insufficiency with secondary lymphedema bilaterally.  Integumentary (Hair, Skin) Widespread rash on his bilateral thighs. Small to medium size macules not particularly scaly. Psychiatric appears at normal baseline. Notes Wound exam; the patient has 2 large areas on the left posterior calf and a smaller area on the right medial calf. These are considerably better than these extensive wounds were several years ago. Nevertheless there is green drainage coming out of the area of the left posterior calf I did a swab culture of the deeper wound area. Electronic Signature(s) Signed: 02/28/2020 5:22:40 PM By: Linton Ham MD Entered By: Linton Ham on 02/25/2020 12:51:42 -------------------------------------------------------------------------------- Physician Orders Details [name],  [name] 02/25/2020 10:30 A M Medical Record Number: [id number] Patient Account Number: [id number] Date of Birth/Sex: Treating RN: 10/24/1955 (64 y.o. [name] Primary Care Provider: [name] Other Clinician: Referring Provider: Treating Provider/Extender: [name] in Treatment: 0 Verbal / Phone Orders: No Diagnosis Coding Follow-up Appointments Return appointment in 1 month. Dressing Change Frequency Change dressing three times week. Skin Barriers/Peri-Wound Care Moisturizing lotion TCA Cream or Ointment Wound Cleansing Clean wound with Wound Cleanser - or normal saline Primary Wound Dressing Wound #20 Left,Lateral Lower Leg Calcium Alginate with Silver Wound #21 Left,Distal Lower Leg Calcium Alginate with Silver Wound #22 Right,Medial Lower Leg Calcium Alginate with Silver Secondary Dressing Wound #20 Left,Lateral Lower Leg ABD pad Zetuvit or Kerramax - or extra absorbent pad Wound #21 Left,Distal Lower Leg ABD pad Zetuvit or Kerramax - or extra absorbent pad Wound #22 Right,Medial Lower Leg ABD pad Zetuvit or Kerramax - or extra absorbent pad Edema Control Unna Boots Bilaterally Laboratory naerobe culture (MICRO) - Wound to Left Lateral Lower Leg Bacteria identified in Unspecified specimen by A LOINC Code: 809-9 Convenience Name: Anerobic culture Electronic Signature(s) Signed: 02/25/2020 4:23:17 PM By: Kela Millin Signed: 02/28/2020 5:22:40 PM By: Linton Ham MD Entered By: Kela Millin on 02/25/2020 12:12:49 -------------------------------------------------------------------------------- Problem List Details [name], [name] 02/25/2020 10:30 A M Medical Record Number: [id number] Patient Account Number: [id number] Date of Birth/Sex: Treating RN: 1956-04-23 (64 y.o. [name] Primary Care Provider: [name] Other Clinician: Referring Provider: Treating  Provider/Extender: [name] in Treatment: 0 Active Problems ICD-10 Encounter Code Description Active Date MDM Diagnosis I87.333 Chronic venous hypertension (idiopathic) with ulcer and inflammation of 02/25/2020 No Yes bilateral lower extremity I89.0 Lymphedema, not elsewhere classified 02/25/2020 No Yes L97.222 Non-pressure chronic ulcer of left calf with fat layer exposed 02/25/2020 No Yes L97.212 Non-pressure chronic ulcer of right calf with fat layer exposed 02/25/2020 No Yes Inactive Problems Resolved Problems Electronic Signature(s) Signed: 02/28/2020 5:22:40 PM By: Linton Ham MD Entered By: Linton Ham on 02/25/2020 12:42:56 -------------------------------------------------------------------------------- Progress Note Details [name], [name] 02/25/2020 10:30 A M Medical Record Number: [id number] Patient Account Number: [id number] Date of Birth/Sex: Treating RN: 07-18-1956 (64 y.o. [name] Primary Care Provider: [name] Other Clinician: Referring Provider: Treating Provider/Extender: [name] in Treatment: 0 Subjective Chief Complaint Information obtained from Patient Bilateral lower extremity wounds 02/25/2020; patient is back in clinic today for review of wounds on his left posterior greater than right lower extremity wounds History of Present Illness (HPI) 07/07/15; this is a patient who is in hospital on 8/2 through 8/4. He had cellulitis and abscess of predominantly I think the left leg. He received IV antibiotics. Plain x-ray showed no osteomyelitis. An MRI of the left leg did  not show osteomyelitis. Cultures showed no predominant organism. His hemoglobin A1c was 9.8. He has a history of venous stasis also peripheral vascular disease. He was discharged to [facility]. He really has extensive ulcerations on the left lateral leg including a major wound  that communicates both posteriorly and superiorly w. When drainage coming out of 2 smaller areas. He has a smaller wound on the posterior medial left leg. He has more predominantly venous insufficiency wounds on predominantly the right medial leg above the ankle the right foot into sections. He has recently been put on Augmentin at the nursing home. Previous ABIs/arterial evaluation showed triphasic waves diffusely he does not have a major ischemic issue a left lower extremity venous duplex also exam showed no evidence of a DVT on 6/21 07/21/15; the patient arrives with really no major change. Culture I did last time was negative. He has not had a follow-up MRI I ordered. The wounds are macerated covered with a thick gelatinous surface slough. There is necrotic subcutaneous tissue 08/04/15; the patient arrives with a considerable improvement in the majority of his wound area. The area on the left leg now has what looks to be a granulated base. Most of the wounds on the left leg required an aggressive surgical debridement to remove nonviable fibrinous eschar and subcutaneous tissue however after debridement most of this looks better. Although I had asked for repeat MRI of the left leg when he first came in here with grossly purulent material coming out of his wounds, it does not appear that this is been done and nor do I actually feel that strongly about it right now. He has severe surrounding venous insufficiency and inflammation. I don't believe he has significant PAD 08/25/15. In general there is still a considerable wound area here but with still extensive service adherent slough. The patient will not allow mechanical debridement due to pain. He did not tolerate Medihoney therefore we are left with Santyl for now. She would appear that he has severe surrounding venous stasis. There is no evidence of the infection that may have had something to do with the pathogenesis of these wounds 09/29/15; patient  still has substantial wound area on the left leg with a cluster of several wounds on the lateral leg confluently on the left leg posteriorly and then a substantial wound on the medial leg. On the right leg a substantial wound medially and a small area on the right lateral foot. All of these underwent a substantial surgical debridement with curettes which she tolerated better than he has in the past. This started as a complex cellulitis in the face of chronic venous insufficiency and inflammation. 10/13/15; substantial cluster of wounds on the lateral aspect of his left leg confluently around to the other side. Extensive surgical debridement to remove for redness surface slough nonviable subcutaneous tissue. This is not an improvement. Also substantial wound on the medial right leg which is largely unchanged. The etiology of this was felt to be a complex cellulitis in the summer of 2016 in the face of chronic venous insufficiency and inflammation. The patient states that the wound on the right medial leg has been there for years off and on. 10/20/15; we are able to start [facility] to these extensive wound areas left greater than right on Tuesday after I spoke to the wound care nurse at the facility. He arrives here today for extensive surgical debridement for the wounds on the lateral aspect of his left leg posterior  left leg, this is almost circumferential. He has a large wound on the medial aspect of his right leg although he finds this too painful for debridement 10/27/15; I continue to bring this patient back for frequent debridement/weekly debridement severe. We have been using Hydrofera Blue. Unfortunately this has not really had any improvement. The debridement surgery difficult and painful for the patient 11/23/15; this patient spent a complex hospitalization admitted with acute kidney injury, anemia, cellulitis of the lower extremity, he was felt to have sepsis pathophysiology although his blood  cultures were negative. He had plain x-rays of both legs that were negative for osseous abnormalities. He was seen by infectious disease and placed on a workup for vasculitis that was negative including a biopsy o4 all were consistent with stasis dermatitis. Vital broad-spectrum antibiotics he continued to spike fevers. Urine and chest x-ray were negative Dopplers on admission ruled out a DVT All antibiotics were stopped on 2/17. . Since his return to [facility] skilled nursing facility I believe they have been using Xeroform 12/07/15 the patient arrives today with the area on his right medial leg actually looking quite stable. No debridement. The rest of his extensive wounds on the left lateral left posterior extending into the left medial leg all required extensive debridement. I think this is probably going to need to and up in the hands of plastic surgery. We'll attempt to change him back to [facility]. Arrange consultation with plastic surgery at [facility] for this almost circumferential wound on the left side 12/21/15 right leg covered in surface slough. This was debridement. Left leg extensive wounds all carefully examined. This is almost circumferential on the left side especially on the posterior calf. No debridement is necessary. 01/04/16 the patient has been to [facility] plastic surgery. Unfortunately it doesn't look like they had any of the prior workup on this patient. They're in the middle of vascular workup. It sounds as though they're applying Hydrofera Blue at the nursing home. 01/22/16; the patient has been back to see [facility]. There apparently making plans to possibly do skin grafts over his large venous insufficiency ulcerations. The patient tells me that he goes to hematology in [facility] and variably uses the term platelets red cells and the description of his problem. 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 I do not see any major  difference in the wound on the right medial leg and circumferentially across the left medial to left lateral leg. Did not attempt to debride these today. Readmission: 05/05/18 on evaluation today patient presents for reevaluation here in our clinic although I have previously seen him in the skilled nursing facility over the past year and a half when I was working in the skilled nursing facility realm instead of covering in the clinic. With that being said during that time we had initiated most recently dressings with Hydrofera Blue Dressing along with the [employer] which had done very well for him. Much better than the Kerlex Coban wraps. With that being said the patient had been doing fairly well and was making progress when I last saw him. Upon evaluation today it appears that the wounds are actually a little bit worse than when I last saw him although definitely not dramatically so. There does not appear to be any evidence of infection which is good news. With that being said he has been tolerating the wraps without complication his left hip still gives him a lot of  trouble which is his main issue as far as movement is concerned. Unbeknownst to me he has not had venous studies that I can find. He previously told me he had there were arterial studies noted back from 04/27/15 which showed that he had try phasic blood flow throughout and again his test appear to be completely normal as performed by Dr. Creig Hines. With that being said I cannot find where he had venous studies. The patient still states he thinks he did these definitely had no venous intervention at this point. Upon evaluation today it does not appear to me that the patient has any evidence of cellulitis. He does have some chronic venous insufficiency which I think has led to stasis dermatitis but again this is not appearing to be infected at this point. No fevers, chills, nausea, or vomiting noted at this  time. READMISSION 06/30/2018 This is a now 64 year old man we have had in this clinic at multiple times in the past. Most recently he came in August and was seen by [facility] stone. It is not clear why he did not come back we asked him really did not get a straight answer. He is at [facility] skilled facility he is a man who has severe chronic venous insufficiency with lymphedema and has had severe wounds on his left greater than right leg. We had him here in 2016 and 17 ultimately referred him to [facility]. He had arterial studies and venous studies done at [facility] although I am not able to access these records I see they are actually done. He also had multiple biopsies that were negative for malignancy showing changes compatible with chronic inflammation. As I understand things in [place] they are using Iodoflex and Unna boots. I am not really sure how they are getting enough Iodoflex for the large wound area especially on his left calf. Nevertheless the wounds look better than when I saw this in 2017. There were plans for him to have plastic surgery in 2018 and I think he was actually prepped for surgery however he was ultimately denied because the patient was an active smoker. The patient is not systemically unwell. The wounds are painful however given the size especially on the left this is not surprising. ABIs in this clinic were 0.78 on the left and 0.91 on the right 07/13/2018; this is a patient with bilateral severe venous ulcers with secondary lymphedema. The wounds are on the right medial calf and a substantial part of the left posterior calf and some involvement medially and laterally on the left. We changed him to silver alginate under Unna boot's last time. The wounds actually look fairly satisfactory today. 08/14/2018; this is a patient with severe bilateral venous stasis ulcers with secondary lymphedema left greater than right he is at [facility] using silver alginate under  Unna boot's I do not think there is much change on this visit versus last time I saw him a month ago Readmission: 11/04/18 patient presents today for follow-up concerning his bilateral lower extremity ulcers. His a right medial ankle ulcer and a left leg that has ulcers over a large proportion of the surface area between the ankle and knee. Unfortunately this does cause some discomfort for the patient although it doesn't seem to be as uncomfortable as it has been in the past. He is seen today for evaluation after a referral by [place] back to the clinic. Unfortunately has a lot of Slough noted on the surface of his wounds. He's  been treated currently it sounds like with Dakin's soaked gauze and they have not been performing the [employer] wrap that was previously recommended. I'm not exactly sure what the reason for the change was. He does have less slough on the surface of the wound but again this is something that is often a constant issue for him. Again he's had these wounds for many years. I've known him for two years at least during that time when I was taking care of them in the facility he is Artie had the wounds for several years prior. READMISSION 02/25/2020 [name] is a now 64 year old man. He has been in our clinic several times before most extensively in October 2016 through May 2017. At that time he had absolutely substantial bilateral lower extremity wounds secondary to chronic venous insufficiency and lymphedema. We ultimately referred him to [facility] he had a series of biopsies only showed suggestion of wound secondary to chronic venous insufficiency. He had a less extensive stay in our clinic in 2019 and then a single visit in February 2020. He is a resident at [facility] skilled facility. I think he has had intermittently had in facility wound care. He returns today. They are using silver alginate on the wounds although I am not sure what type of compression. Previously he has  favored Unna boots. He is not felt to have an arterial issue. Past medical history; lymphedema and chronic venous insufficiency, diabetes mellitus, hypertension, congestive heart failure We did not do arterial studies today Patient History Information obtained from Patient. Allergies Sulfa (Sulfonamide Antibiotics) (Severity: Severe, Reaction: sweating,chils, body aches) Family History Diabetes - Mother,Father,Siblings, Hypertension - Mother,Father, No family history of Cancer, Heart Disease, Hereditary Spherocytosis, Kidney Disease, Lung Disease, Seizures, Stroke, Thyroid Problems, Tuberculosis. Social History Current every day smoker - 4 cig per day, Marital Status - Separated, Alcohol Use - Never, Drug Use - Prior History - cocaine, [place] quit 2005, Caffeine Use - Moderate. Medical History Eyes Denies history of Cataracts, Glaucoma, Optic Neuritis Ear/Nose/Mouth/Throat Denies history of Chronic sinus problems/congestion, Middle ear problems Hematologic/Lymphatic Patient has history of Anemia - iron deficiency Denies history of Hemophilia, Human Immunodeficiency Virus, Lymphedema, Sickle Cell Disease Respiratory Patient has history of Sleep Apnea Denies history of Aspiration, Asthma, Chronic Obstructive Pulmonary Disease (COPD), Pneumothorax, Tuberculosis Cardiovascular Patient has history of Congestive Heart Failure, Hypertension, Peripheral Venous Disease Denies history of Angina, Arrhythmia, Coronary Artery Disease, Deep Vein Thrombosis, Hypotension, Myocardial Infarction, Peripheral Arterial Disease, Phlebitis, Vasculitis Gastrointestinal Denies history of Cirrhosis , Colitis, Crohnoos, Hepatitis A, Hepatitis B, Hepatitis C Endocrine Patient has history of Type II Diabetes - uncontrolled Denies history of Type I Diabetes Genitourinary Denies history of End Stage Renal Disease Immunological Denies history of Lupus Erythematosus, Raynaudoos, Scleroderma Integumentary  (Skin) Denies history of History of Burn Musculoskeletal Patient has history of Osteoarthritis Denies history of Gout, Rheumatoid Arthritis, Osteomyelitis Neurologic Patient has history of Neuropathy Denies history of Dementia, Quadriplegia, Paraplegia, Seizure Disorder Oncologic Denies history of Received Chemotherapy, Received Radiation Psychiatric Denies history of Anorexia/bulimia, Confinement Anxiety Hospitalization/Surgery History - inguinal hernia repair with mesh. - right shoulder rotator cuff repair. - toe nail excision. Medical A Surgical History Notes nd Gastrointestinal GERD Musculoskeletal degenerative righ thip Psychiatric depression Review of Systems (ROS) Constitutional Symptoms (General Health) Denies complaints or symptoms of Fatigue, Fever, Chills, Marked Weight Change. Eyes Denies complaints or symptoms of Dry Eyes, Vision Changes, Glasses / Contacts. Ear/Nose/Mouth/Throat Denies complaints or symptoms of Chronic sinus problems or rhinitis. Respiratory Denies complaints  or symptoms of Chronic or frequent coughs, Shortness of Breath. Cardiovascular Denies complaints or symptoms of Chest pain. Gastrointestinal Denies complaints or symptoms of Frequent diarrhea, Nausea, Vomiting. Endocrine Denies complaints or symptoms of Heat/cold intolerance. Genitourinary Denies complaints or symptoms of Frequent urination. Integumentary (Skin) Denies complaints or symptoms of Wounds. Musculoskeletal Denies complaints or symptoms of Muscle Pain, Muscle Weakness. Neurologic Denies complaints or symptoms of Numbness/parasthesias. Psychiatric Denies complaints or symptoms of Claustrophobia, Suicidal. Objective Constitutional Patient is hypertensive.. Pulse regular and within target range for patient.Marland Kitchen Respirations regular, non-labored and within target range.. Temperature is normal and within the target range for the patient.Marland Kitchen Appears in no distress. Vitals Time  Taken: 11:14 AM, Height: 77 in, Source: Stated, Weight: 254 lbs, Source: Stated, BMI: 30.1, Temperature: 98.6 F, Pulse: 66 bpm, Respiratory Rate: 18 breaths/min, Blood Pressure: 160/82 mmHg. Respiratory work of breathing is normal. Cardiovascular Pedal pulses are palpable. Severe bilateral changes of chronic venous insufficiency with secondary lymphedema bilaterally. Psychiatric appears at normal baseline. General Notes: Wound exam; the patient has 2 large areas on the left posterior calf and a smaller area on the right medial calf. These are considerably better than these extensive wounds were several years ago. Nevertheless there is green drainage coming out of the area of the left posterior calf I did a swab culture of the deeper wound area. Integumentary (Hair, Skin) Widespread rash on his bilateral thighs. Small to medium size macules not particularly scaly. Wound #20 status is Open. Original cause of wound was Gradually Appeared. The wound is located on the Left,Lateral Lower Leg. The wound measures 4.5cm length x 2.5cm width x 0.3cm depth; 8.836cm^2 area and 2.651cm^3 volume. There is Fat Layer (Subcutaneous Tissue) Exposed exposed. There is no tunneling or undermining noted. There is a medium amount of serosanguineous drainage noted. There is large (67-100%) red, pink granulation within the wound bed. There is a small (1-33%) amount of necrotic tissue within the wound bed including Adherent Slough. Wound #21 status is Open. Original cause of wound was Gradually Appeared. The wound is located on the Left,Distal Lower Leg. The wound measures 16cm length x 12.5cm width x 0.6cm depth; 157.08cm^2 area and 94.248cm^3 volume. There is Fat Layer (Subcutaneous Tissue) Exposed exposed. There is no tunneling or undermining noted. There is a medium amount of serosanguineous drainage noted. The wound margin is well defined and not attached to the wound base. There is large (67-100%) red granulation  within the wound bed. There is a small (1-33%) amount of necrotic tissue within the wound bed including Adherent Slough. Wound #22 status is Open. Original cause of wound was Gradually Appeared. The wound is located on the Right,Medial Lower Leg. The wound measures 6.4cm length x 4.5cm width x 0.2cm depth; 22.619cm^2 area and 4.524cm^3 volume. There is Fat Layer (Subcutaneous Tissue) Exposed exposed. There is no tunneling or undermining noted. There is a medium amount of serosanguineous drainage noted. There is small (1-33%) red, pink granulation within the wound bed. There is a large (67-100%) amount of necrotic tissue within the wound bed including Adherent Slough. Assessment Active Problems ICD-10 Chronic venous hypertension (idiopathic) with ulcer and inflammation of bilateral lower extremity Lymphedema, not elsewhere classified Non-pressure chronic ulcer of left calf with fat layer exposed Non-pressure chronic ulcer of right calf with fat layer exposed Procedures Wound #20 Pre-procedure diagnosis of Wound #20 is a Venous Leg Ulcer located on the Left,Lateral Lower Leg . There was a Haematologist Compression Therapy Procedure by [name], RN. Post procedure  Diagnosis Wound #20: Same as Pre-Procedure Wound #21 Pre-procedure diagnosis of Wound #21 is a Venous Leg Ulcer located on the Left,Distal Lower Leg . There was a Haematologist Compression Therapy Procedure by [name], RN. Post procedure Diagnosis Wound #21: Same as Pre-Procedure Wound #22 Pre-procedure diagnosis of Wound #22 is a Venous Leg Ulcer located on the Right,Medial Lower Leg . There was a Haematologist Compression Therapy Procedure by [name], RN. Post procedure Diagnosis Wound #22: Same as Pre-Procedure Plan Follow-up Appointments: Return appointment in 1 month. Dressing Change Frequency: Change dressing three times week. Skin Barriers/Peri-Wound Care: Moisturizing lotion TCA Cream or Ointment Wound  Cleansing: Clean wound with Wound Cleanser - or normal saline Primary Wound Dressing: Wound #20 Left,Lateral Lower Leg: Calcium Alginate with Silver Wound #21 Left,Distal Lower Leg: Calcium Alginate with Silver Wound #22 Right,Medial Lower Leg: Calcium Alginate with Silver Secondary Dressing: Wound #20 Left,Lateral Lower Leg: ABD pad Zetuvit or Kerramax - or extra absorbent pad Wound #21 Left,Distal Lower Leg: ABD pad Zetuvit or Kerramax - or extra absorbent pad Wound #22 Right,Medial Lower Leg: ABD pad Zetuvit or Kerramax - or extra absorbent pad Edema Control: Stage manager ordered were: Anerobic culture - Wound to Left Lateral Lower Leg 1. I agree with silver alginate to wound areas 2. TCA and moisturizer to bilateral lower extremity skin 3. Bilateral Unna boots 4. We have asked him to try and keep his legs elevated in the facility 5. He is reasonably immobile and seems to have extension flexion contractures bilaterally. I do not have a good explanation for this although I have not had a chance to look back through [facility] health link. Electronic Signature(s) Signed: 02/28/2020 5:22:40 PM By: Linton Ham MD Entered By: Linton Ham on 02/25/2020 12:53:17 -------------------------------------------------------------------------------- HxROS Details [name], [name] 02/25/2020 10:30 A M Medical Record Number: [id number] Patient Account Number: [id number] Date of Birth/Sex: Treating RN: 08-03-1956 (64 y.o. [name] Primary Care Provider: [name] Other Clinician: Referring Provider: Treating Provider/Extender: [name] in Treatment: 0 Information Obtained From Patient Constitutional Symptoms (General Health) Complaints and Symptoms: Negative for: Fatigue; Fever; Chills; Marked Weight Change Eyes Complaints and Symptoms: Negative for: Dry Eyes; Vision Changes; Glasses /  Contacts Medical History: Negative for: Cataracts; Glaucoma; Optic Neuritis Ear/Nose/Mouth/Throat Complaints and Symptoms: Negative for: Chronic sinus problems or rhinitis Medical History: Negative for: Chronic sinus problems/congestion; Middle ear problems Respiratory Complaints and Symptoms: Negative for: Chronic or frequent coughs; Shortness of Breath Medical History: Positive for: Sleep Apnea Negative for: Aspiration; Asthma; Chronic Obstructive Pulmonary Disease (COPD); Pneumothorax; Tuberculosis Cardiovascular Complaints and Symptoms: Negative for: Chest pain Medical History: Positive for: Congestive Heart Failure; Hypertension; Peripheral Venous Disease Negative for: Angina; Arrhythmia; Coronary Artery Disease; Deep Vein Thrombosis; Hypotension; Myocardial Infarction; Peripheral Arterial Disease; Phlebitis; Vasculitis Gastrointestinal Complaints and Symptoms: Negative for: Frequent diarrhea; Nausea; Vomiting Medical History: Negative for: Cirrhosis ; Colitis; Crohns; Hepatitis A; Hepatitis B; Hepatitis C Past Medical History Notes: GERD Endocrine Complaints and Symptoms: Negative for: Heat/cold intolerance Medical History: Positive for: Type II Diabetes - uncontrolled Negative for: Type I Diabetes Time with diabetes: 1999 Treated with: Insulin Blood sugar tested every day: Yes T ested : 3x per day Blood sugar testing results: Breakfast: 81-200; Lunch: 180-210; Dinner: 200 Genitourinary Complaints and Symptoms: Negative for: Frequent urination Medical History: Negative for: End Stage Renal Disease Integumentary (Skin) Complaints and Symptoms: Negative for: Wounds Medical History: Negative for: History of Burn Musculoskeletal  Complaints and Symptoms: Negative for: Muscle Pain; Muscle Weakness Medical History: Positive for: Osteoarthritis Negative for: Gout; Rheumatoid Arthritis; Osteomyelitis Past Medical History Notes: degenerative righ  thip Neurologic Complaints and Symptoms: Negative for: Numbness/parasthesias Medical History: Positive for: Neuropathy Negative for: Dementia; Quadriplegia; Paraplegia; Seizure Disorder Psychiatric Complaints and Symptoms: Negative for: Claustrophobia; Suicidal Medical History: Negative for: Anorexia/bulimia; Confinement Anxiety Past Medical History Notes: depression Hematologic/Lymphatic Medical History: Positive for: Anemia - iron deficiency Negative for: Hemophilia; Human Immunodeficiency Virus; Lymphedema; Sickle Cell Disease Immunological Medical History: Negative for: Lupus Erythematosus; Raynauds; Scleroderma Oncologic Medical History: Negative for: Received Chemotherapy; Received Radiation Immunizations Pneumococcal Vaccine: Received Pneumococcal Vaccination: Yes Implantable Devices None Hospitalization / Surgery History Type of Hospitalization/Surgery inguinal hernia repair with mesh right shoulder rotator cuff repair toe nail excision Family and Social History Cancer: No; Diabetes: Yes - Mother,Father,Siblings; Heart Disease: No; Hereditary Spherocytosis: No; Hypertension: Yes - Mother,Father; Kidney Disease: No; Lung Disease: No; Seizures: No; Stroke: No; Thyroid Problems: No; Tuberculosis: No; Current every day smoker - 4 cig per day; Marital Status - Separated; Alcohol Use: Never; Drug Use: Prior History - cocaine, [place] quit 2005; Caffeine Use: Moderate; Financial Concerns: No; Food, Clothing or Shelter Needs: No; Support System Lacking: No; Transportation Concerns: No Engineer, maintenance) Signed: 02/28/2020 5:22:40 PM By: Linton Ham MD Signed: 02/29/2020 4:34:33 PM By: Carlene Coria RN Entered By: Carlene Coria on 02/25/2020 11:16:49 -------------------------------------------------------------------------------- SuperBill Details [name], [name] 02/25/2020 Medical Record Number: [id number] Patient Account Number:  [id number] Date of Birth/Sex: Treating RN: Nov 18, 1955 (64 y.o. [name] Primary Care Provider: [name] Other Clinician: Referring Provider: Treating Provider/Extender: [name] in Treatment: 0 Diagnosis Coding ICD-10 Codes Code Description [phone] Chronic venous hypertension (idiopathic) with ulcer and inflammation of bilateral lower extremity I89.0 Lymphedema, not elsewhere classified L97.222 Non-pressure chronic ulcer of left calf with fat layer exposed L97.212 Non-pressure chronic ulcer of right calf with fat layer exposed Facility Procedures CPT4 Code: [id number] Description: [place] VISIT-LEV 3 EST PT Modifier: Quantity: 1 CPT4 Code: [id number] Description: 95638 - APPLY UNNA BOOT/PROFO BILATERAL Modifier: Quantity: 1 Physician Procedures : CPT4 Code Description Modifier [id number] - WC PHYS LEVEL 4 - EST PT ICD-10 Diagnosis Description I87.333 Chronic venous hypertension (idiopathic) with ulcer and inflammation of bilateral lower extremity I89.0 Lymphedema, not elsewhere classified  L97.222 Non-pressure chronic ulcer of left calf with fat layer exposed L97.212 Non-pressure chronic ulcer of right calf with fat layer exposed Quantity: 1 Electronic Signature(s) Signed: 02/28/2020 5:22:40 PM By: Linton Ham MD Entered By: Linton Ham on 02/25/2020 12:53:42

## 2020-02-29 NOTE — Progress Notes (Signed)
[name] ([id number]) Visit Report for 02/25/2020 Abuse/Suicide Risk Screen Details [name], [name] 02/25/2020 10:30 A M Medical Record Number: [id number] Patient Account Number: [id number] Date of Birth/Sex: Treating RN: [birth year] (64 y.o. [name]) Carlene Coria Primary Care Bobbijo Holst: [name] Other Clinician: Referring Itati Brocksmith: Treating Hend Mccarrell/Extender: [name] in Treatment: 0 Abuse/Suicide Risk Screen Items Answer ABUSE RISK SCREEN: Has anyone close to you tried to hurt or harm you recentlyo No Do you feel uncomfortable with anyone in your familyo No Has anyone forced you do things that you didnt want to doo No Electronic Signature(s) Signed: 02/29/2020 4:34:33 PM By: Carlene Coria RN Entered By: Carlene Coria on 02/25/2020 11:16:58 -------------------------------------------------------------------------------- Activities of Daily Living Details [name], [name] 02/25/2020 10:30 A M Medical Record Number: [id number] Patient Account Number: [id number] Date of Birth/Sex: Treating RN: 10-05-1955 (64 y.o. [name]) Carlene Coria Primary Care Brigg Cape: [name] Other Clinician: Referring Ruhani Umland: Treating Ayauna Mcnay/Extender: [name] in Treatment: 0 Activities of Daily Living Items Answer Activities of Daily Living (Please select one for each item) Drive Automobile Completely Able T Medications ake Need Assistance Use T elephone Completely Able Care for Appearance Need Assistance Use T oilet Need Assistance Bath / Shower Need Assistance Dress Self Need Assistance Feed Self Completely Able Walk Not Able Get In / Out Bed Need Assistance Housework Not Able Prepare Meals Not Able Handle Money Need Assistance Shop for Self Not Able Electronic Signature(s) Signed: 02/29/2020 4:34:33 PM By: Carlene Coria RN Entered By: Carlene Coria on 02/25/2020  11:17:34 -------------------------------------------------------------------------------- Education Screening Details [name], [name] 02/25/2020 10:30 A M Medical Record Number: [id number] Patient Account Number: [id number] Date of Birth/Sex: Treating RN: 05/06/1956 (64 y.o. [name]) Carlene Coria Primary Care Deletha Jaffee: [name] Other Clinician: Referring Macario Shear: Treating Sanayah Munro/Extender: [name] in Treatment: 0 Primary Learner Assessed: Patient Learning Preferences/Education Level/Primary Language Learning Preference: Explanation Highest Education Level: High School Preferred Language: English Cognitive Barrier Language Barrier: No Translator Needed: No Memory Deficit: No Emotional Barrier: No Cultural/Religious Beliefs Affecting Medical Care: No Physical Barrier Impaired Vision: No Impaired Hearing: No Decreased Hand dexterity: No Knowledge/Comprehension Knowledge Level: Medium Comprehension Level: High Ability to understand written instructions: High Ability to understand verbal instructions: High Motivation Anxiety Level: Calm Cooperation: Cooperative Education Importance: Acknowledges Need Interest in Health Problems: Asks Questions Perception: Coherent Willingness to Engage in Self-Management High Activities: Readiness to Engage in Self-Management High Activities: Electronic Signature(s) Signed: 02/29/2020 4:34:33 PM By: Carlene Coria RN Entered By: Carlene Coria on 02/25/2020 11:18:00 -------------------------------------------------------------------------------- Fall Risk Assessment Details [name], [name] 02/25/2020 10:30 A M Medical Record Number: [id number] Patient Account Number: [id number] Date of Birth/Sex: Treating RN: 10-31-1955 (64 y.o. [name]) Carlene Coria Primary Care Romell Cavanah: [name] Other Clinician: Referring Anida Deol: Treating Alanya Vukelich/Extender:  [name] in Treatment: 0 Fall Risk Assessment Items Have you had 2 or more falls in the last 12 monthso 0 No Have you had any fall that resulted in injury in the last 12 monthso 0 No FALLS RISK SCREEN History of falling - immediate or within 3 months 0 No Secondary diagnosis (Do you have 2 or more medical diagnoseso) 0 No Ambulatory aid None/bed rest/wheelchair/nurse 0 No Crutches/cane/walker 0 No Furniture 0 No Intravenous therapy Access/Saline/Heparin Lock 0 No Gait/Transferring Normal/ bed rest/ wheelchair 0 No Weak (short steps with or without shuffle,  stooped but able to lift head while walking, may seek 0 No support from furniture) Impaired (short steps with shuffle, may have difficulty arising from chair, head down, impaired 0 No balance) Mental Status Oriented to own ability 0 No Electronic Signature(s) Signed: 02/29/2020 4:34:33 PM By: Carlene Coria RN Entered By: Carlene Coria on 02/25/2020 11:18:08 -------------------------------------------------------------------------------- Foot Assessment Details [name], [name] 02/25/2020 10:30 A M Medical Record Number: [id number] Patient Account Number: [id number] Date of Birth/Sex: Treating RN: 1955/11/24 (64 y.o. [name]) Carlene Coria Primary Care Maribeth Jiles: [name] Other Clinician: Referring Raksha Wolfgang: Treating Zoila Ditullio/Extender: Bayard Hugger Weeks in Treatment: 0 Foot Assessment Items Site Locations + = Sensation present, - = Sensation absent, C = Callus, U = Ulcer R = Redness, W = Warmth, M = Maceration, PU = Pre-ulcerative lesion F = Fissure, S = Swelling, D = Dryness Assessment Right: Left: Other Deformity: No No Prior Foot Ulcer: No No Prior Amputation: No No Charcot Joint: No No Ambulatory Status: Non-ambulatory Assistance Device: Scientist, research (life sciences): Electronic Signature(s) Signed: 02/29/2020 4:34:33 PM By: Carlene Coria RN Entered By:  Carlene Coria on 02/25/2020 11:21:03 -------------------------------------------------------------------------------- Nutrition Risk Screening Details [name], [name] 02/25/2020 10:30 A M Medical Record Number: [id number] Patient Account Number: [id number] Date of Birth/Sex: Treating RN: 04/07/1956 (64 y.o. [name]) Carlene Coria Primary Care Kanylah Muench: [name] Other Clinician: Referring Ethridge Sollenberger: Treating Zenora Karpel/Extender: Bayard Hugger Weeks in Treatment: 0 Height (in): 77 Weight (lbs): 254 Body Mass Index (BMI): 30.1 Nutrition Risk Screening Items Score Screening NUTRITION RISK SCREEN: I have an illness or condition that made me change the kind and/or amount of food I eat 0 No I eat fewer than two meals per day 0 No I eat few fruits and vegetables, or milk products 0 No I have three or more drinks of beer, liquor or wine almost every day 0 No I have tooth or mouth problems that make it hard for me to eat 0 No I don't always have enough money to buy the food I need 0 No I eat alone most of the time 0 No I take three or more different prescribed or over-the-counter drugs a day 1 Yes Without wanting to, I have lost or gained 10 pounds in the last six months 0 No I am not always physically able to shop, cook and/or feed myself 2 Yes Nutrition Protocols Good Risk Protocol Moderate Risk Protocol 0 Provide education on nutrition High Risk Proctocol Risk Level: Moderate Risk Score: 3 Electronic Signature(s) Signed: 02/29/2020 4:34:33 PM By: Carlene Coria RN Entered By: Carlene Coria on 02/25/2020 11:18:22

## 2020-03-24 ENCOUNTER — Encounter (HOSPITAL_BASED_OUTPATIENT_CLINIC_OR_DEPARTMENT_OTHER): Payer: Medicare Other | Attending: Internal Medicine | Admitting: Internal Medicine

## 2020-04-10 ENCOUNTER — Encounter (HOSPITAL_BASED_OUTPATIENT_CLINIC_OR_DEPARTMENT_OTHER): Payer: Medicare Other | Attending: Internal Medicine | Admitting: Internal Medicine

## 2020-04-27 ENCOUNTER — Encounter (HOSPITAL_BASED_OUTPATIENT_CLINIC_OR_DEPARTMENT_OTHER): Payer: Medicare Other | Attending: Physician Assistant | Admitting: Physician Assistant

## 2020-04-27 DIAGNOSIS — I87333 Chronic venous hypertension (idiopathic) with ulcer and inflammation of bilateral lower extremity: Secondary | ICD-10-CM | POA: Insufficient documentation

## 2020-04-27 DIAGNOSIS — L97212 Non-pressure chronic ulcer of right calf with fat layer exposed: Secondary | ICD-10-CM | POA: Diagnosis not present

## 2020-04-27 DIAGNOSIS — L97222 Non-pressure chronic ulcer of left calf with fat layer exposed: Secondary | ICD-10-CM | POA: Diagnosis not present

## 2020-04-27 DIAGNOSIS — I890 Lymphedema, not elsewhere classified: Secondary | ICD-10-CM | POA: Diagnosis not present

## 2020-04-27 NOTE — Progress Notes (Signed)
Mateo, Jaquille ([id number]) Visit Report for 04/27/2020 Arrival Information Details [name], [name] 04/27/2020 9:15 A M Medical Record Number: [id number] Patient Account Number: [id number] Date of Birth/Sex: Treating RN: Mar 05, 1956 (64 y.o. [name] Primary Care Alexie Lanni: [name] Other Clinician: Referring Cloyce Paterson: Treating Ladarius Seubert/Extender: [name] in Treatment: 8 Visit Information History Since Last Visit Added or deleted any medications: Yes Patient Arrived: Wheel Chair Any new allergies or adverse reactions: No Arrival Time: 09:07 Had a fall or experienced change in No Accompanied By: self activities of daily living that may affect Transfer Assistance: None risk of falls: Patient Requires Transmission-Based Precautions: No Signs or symptoms of abuse/neglect since last visito No Patient Has Alerts: No Hospitalized since last visit: No Implantable device outside of the clinic excluding No cellular tissue based products placed in the center since last visit: Has Dressing in Place as Prescribed: Yes Has Compression in Place as Prescribed: Yes Pain Present Now: Yes Notes stays in wheelchair Electronic Signature(s) Signed: 04/27/2020 5:04:18 PM By: [name] RN, BSN Entered By: [name] on 04/27/2020 09:08:18 -------------------------------------------------------------------------------- Compression Therapy Details [name], [name] 04/27/2020 9:15 A M Medical Record Number: [id number] Patient Account Number: [id number] Date of Birth/Sex: Treating RN: Jul 27, 1956 (64 y.o. [name] Primary Care Zephyr Sausedo: [name] Other Clinician: Referring Ladrea Holladay: Treating Lorrinda Ramstad/Extender: [name] in Treatment: 8 Compression Therapy Performed for Wound Assessment: Wound #20 Left,Lateral Lower Leg Performed By: Clinician Levan Hurst, RN Compression Type: Rolena Infante Post Procedure Diagnosis Same as Pre-procedure Electronic Signature(s) Signed: 04/27/2020 5:21:32 PM By: Levan Hurst RN, BSN Entered By: Levan Hurst on 04/27/2020 10:03:44 -------------------------------------------------------------------------------- Compression Therapy Details [name], [name] 04/27/2020 9:15 A M Medical Record Number: [id number] Patient Account Number: [id number] Date of Birth/Sex: Treating RN: 1956/07/24 (64 y.o. [name] Primary Care Knut Rondinelli: [name] Other Clinician: Referring Kollyn Lingafelter: Treating Tammy Ericsson/Extender: [name] in Treatment: 8 Compression Therapy Performed for Wound Assessment: Wound #21 Left,Distal Lower Leg Performed By: Clinician Levan Hurst, RN Compression Type: Rolena Infante Post Procedure Diagnosis Same as Pre-procedure Electronic Signature(s) Signed: 04/27/2020 5:21:32 PM By: Levan Hurst RN, BSN Entered By: Levan Hurst on 04/27/2020 10:03:44 -------------------------------------------------------------------------------- Compression Therapy Details [name], [name] 04/27/2020 9:15 A M Medical Record Number: [id number] Patient Account Number: [id number] Date of Birth/Sex: Treating RN: 08-23-1956 (64 y.o. [name] Primary Care [name] Merrihew: [name] Other Clinician: Referring Byrd Terrero: Treating Cozette Braggs/Extender: [name] in Treatment: 8 Compression Therapy Performed for Wound Assessment: Wound #22 Right,Medial Lower Leg Performed By: Clinician Levan Hurst, RN Compression Type: Rolena Infante Post Procedure Diagnosis Same as Pre-procedure Electronic Signature(s) Signed: 04/27/2020 5:21:32 PM By: Levan Hurst RN, BSN Entered By: Levan Hurst on 04/27/2020  10:03:44 -------------------------------------------------------------------------------- Encounter Discharge Information Details [name], [name] 04/27/2020 9:15 A M Medical Record Number: [id number] Patient Account Number: [id number] Date of Birth/Sex: Treating RN: [birth year] (64 y.o. [name] Primary Care Tametria Aho: [name] Other Clinician: Referring Greenley Martone: Treating Hephzibah Strehle/Extender: [name] in Treatment: 8 Encounter Discharge Information Items Post Procedure Vitals Discharge Condition: Stable Temperature (F): 99 Ambulatory Status: Wheelchair Pulse (bpm): 62 Discharge Destination: [place] Respiratory Rate (breaths/min): 18 Telephoned: No Blood Pressure (mmHg): 151/83 Orders Sent: Yes Transportation: Other Accompanied By: self Schedule Follow-up  Appointment: Yes Clinical Summary of Care: Patient Declined Notes facility transportation Electronic Signature(s) Signed: 04/27/2020 5:04:18 PM By: [name] RN, BSN Entered By: [name] on 04/27/2020 10:34:43 -------------------------------------------------------------------------------- Lower Extremity Assessment Details [name], [name] 04/27/2020 9:15 A M Medical Record Number: [id number] Patient Account Number: [id number] Date of Birth/Sex: Treating RN: 17-Nov-1955 (64 y.o. [name] Primary Care Markie Heffernan: [name] Other Clinician: Referring Ellington Cornia: Treating Vence Lalor/Extender: Alexis Goodell Weeks in Treatment: 8 Edema Assessment Assessed: [Left: No] [Right: No] [Left: Edema] [Right: :] Calf Left: Right: Point of Measurement: 52 cm From Medial Instep 37.3 cm 37 cm Ankle Left: Right: Point of Measurement: 10 cm From Medial Instep 25.4 cm 25.5 cm Vascular Assessment Pulses: Dorsalis Pedis Palpable: [Left:No] [Right:No] Electronic  Signature(s) Signed: 04/27/2020 5:04:18 PM By: [name] RN, BSN Entered By: [name] on 04/27/2020 09:30:25 -------------------------------------------------------------------------------- Multi-Disciplinary Care Plan Details [name], [name] 04/27/2020 9:15 A M Medical Record Number: [id number] Patient Account Number: [id number] Date of Birth/Sex: Treating RN: 06-Jul-1956 (64 y.o. [name] Primary Care Lymon Kidney: [name] Other Clinician: Referring Trinitee Horgan: Treating Thomasena Vandenheuvel/Extender: [name] in Treatment: 8 Active Inactive Wound/Skin Impairment Nursing Diagnoses: Impaired tissue integrity Goals: Patient/caregiver will verbalize understanding of skin care regimen Date Initiated: 04/27/2020 Target Resolution Date: 05/26/2020 Goal Status: Active Ulcer/skin breakdown will have a volume reduction of 50% by week 8 Date Initiated: 02/25/2020 Date Inactivated: 04/27/2020 Target Resolution Date: 04/14/2020 Goal Status: Met Interventions: Provide education on ulcer and skin care Notes: Electronic Signature(s) Signed: 04/27/2020 5:21:32 PM By: Levan Hurst RN, BSN Entered By: Levan Hurst on 04/27/2020 09:33:38 -------------------------------------------------------------------------------- Pain Assessment Details [name], [name] 04/27/2020 9:15 A M Medical Record Number: [id number] Patient Account Number: [id number] Date of Birth/Sex: Treating RN: 06/20/1956 (64 y.o. [name] Primary Care Shamiya Demeritt: [name] Other Clinician: Referring Hellena Pridgen: Treating Shellsea Borunda/Extender: [name] in Treatment: 8 Active Problems Location of Pain Severity and Description of Pain Patient Has Paino Yes Site Locations Pain Location: Generalized Pain, Pain in Ulcers With Dressing Change: Yes Duration of the Pain. Constant / Intermittento  Constant Rate the pain. Current Pain Level: 7 Worst Pain Level: 7 Least Pain Level: 2 Character of Pain Describe the Pain: Aching Pain Management and Medication Current Pain Management: Medication: Yes Is the Current Pain Management Adequate: Adequate How does your wound impact your activities of daily livingo Sleep: Yes Bathing: No Appetite: No Relationship With Others: No Bladder Continence: No Emotions: No Bowel Continence: No Work: No Toileting: No Drive: No Dressing: No Hobbies: No Engineer, maintenance) Signed: 04/27/2020 5:04:18 PM By: [name] RN, BSN Entered By: [name] on 04/27/2020 09:10:04 -------------------------------------------------------------------------------- Patient/Caregiver Education Details Patient Name: Date of Service: Orma Flaming RD, [name] 8/5/2021andnbsp9:15 A M Medical Record Number: [id number] Patient Account Number: [id number] Date of Birth/Gender: Treating RN: Mar 18, 1956 (64 y.o. [name] Primary Care Physician: [name] Other Clinician: Referring Physician: Treating Physician/Extender: [name] in Treatment: 8 Education Assessment Education Provided To: Patient Education Topics Provided Wound/Skin Impairment: Methods: Explain/Verbal Responses: State content correctly Electronic Signature(s) Signed: 04/27/2020 5:21:32 PM By: Levan Hurst RN, BSN Entered By: Levan Hurst on 04/27/2020 09:33:48 -------------------------------------------------------------------------------- Wound Assessment Details [name], [name] 04/27/2020 9:15 A M Medical Record Number: [id number] Patient Account Number: [id number] Date of Birth/Sex: Treating RN: 11-02-1955 (64 y.o.  [name] Primary Care Doreen Garretson: [name] Other Clinician: Referring Tashe Purdon: Treating Anginette Espejo/Extender: Alexis Goodell Weeks in Treatment: 8 Wound  Status Wound Number: 20 Primary Venous Leg Ulcer Etiology: Wound Location: Left, Lateral Lower Leg Wound Open Wounding Event: Gradually Appeared Status: Date Acquired: 11/22/2019 Comorbid Anemia, Sleep Apnea, Congestive Heart Failure, Hypertension, Weeks Of Treatment: 8 History: Peripheral Venous Disease, Type II Diabetes, Osteoarthritis, Clustered Wound: No Neuropathy Photos Photo Uploaded By: Mikeal Hawthorne on 04/27/2020 13:22:08 Wound Measurements Length: (cm) 5.9 Width: (cm) 4.1 Depth: (cm) 0.5 Area: (cm) 18.999 Volume: (cm) 9.499 % Reduction in Area: -115% % Reduction in Volume: -258.3% Epithelialization: None Tunneling: No Undermining: No Wound Description Classification: Full Thickness Without Exposed Support Struct Wound Margin: Flat and Intact Exudate Amount: Medium Exudate Type: Serosanguineous Exudate Color: red, brown ures Foul Odor After Cleansing: No Slough/Fibrino Yes Wound Bed Granulation Amount: Medium (34-66%) Exposed Structure Granulation Quality: Red, Pink Fascia Exposed: No Necrotic Amount: Medium (34-66%) Fat Layer (Subcutaneous Tissue) Exposed: Yes Necrotic Quality: Adherent Slough Tendon Exposed: No Muscle Exposed: No Joint Exposed: No Bone Exposed: No Treatment Notes Wound #20 (Left, Lateral Lower Leg) 2. Periwound Care Barrier cream Moisturizing lotion 3. Primary Dressing Applied Hydrofera Blue 4. Secondary Dressing ABD Pad Other secondary dressing (specify in notes) 6. Support Layer Production assistant, radio Notes Academic librarian) Signed: 04/27/2020 5:04:18 PM By: [name] RN, BSN Entered By: [name] on 04/27/2020 09:31:19 -------------------------------------------------------------------------------- Wound Assessment Details [name], [name] 04/27/2020 9:15 A M Medical Record Number: [id number] Patient Account Number: [id number] Date of Birth/Sex: Treating RN: 1956/06/10 (64  y.o. [name] Primary Care Giovany Cosby: [name] Other Clinician: Referring Haru Anspaugh: Treating Deitrick Ferreri/Extender: Alexis Goodell Weeks in Treatment: 8 Wound Status Wound Number: 21 Primary Venous Leg Ulcer Etiology: Wound Location: Left, Distal Lower Leg Wound Open Wounding Event: Gradually Appeared Status: Date Acquired: 11/22/2019 Comorbid Anemia, Sleep Apnea, Congestive Heart Failure, Hypertension, Weeks Of Treatment: 8 History: Peripheral Venous Disease, Type II Diabetes, Osteoarthritis, Clustered Wound: No Neuropathy Photos Photo Uploaded By: Mikeal Hawthorne on 04/27/2020 13:22:09 Wound Measurements Length: (cm) 16.2 Width: (cm) 17.5 Depth: (cm) 0.5 Area: (cm) 222.66 Volume: (cm) 111.33 % Reduction in Area: -41.7% % Reduction in Volume: -18.1% Epithelialization: Small (1-33%) Tunneling: No Undermining: No Wound Description Classification: Full Thickness Without Exposed Support Structures Wound Margin: Distinct, outline attached Exudate Amount: Large Exudate Type: Serosanguineous Exudate Color: red, brown Foul Odor After Cleansing: No Slough/Fibrino Yes Wound Bed Granulation Amount: Medium (34-66%) Exposed Structure Granulation Quality: Red Fascia Exposed: No Necrotic Amount: Medium (34-66%) Fat Layer (Subcutaneous Tissue) Exposed: Yes Necrotic Quality: Adherent Slough Tendon Exposed: No Muscle Exposed: No Joint Exposed: No Bone Exposed: No Assessment Notes excoriated wound margins Treatment Notes Wound #21 (Left, Distal Lower Leg) 2. Periwound Care Barrier cream Moisturizing lotion 3. Primary Dressing Applied Hydrofera Blue 4. Secondary Dressing ABD Pad Other secondary dressing (specify in notes) 6. Support Layer Production assistant, radio Notes Academic librarian) Signed: 04/27/2020 5:04:18 PM By: [name] RN, BSN Entered By: [name] on 04/27/2020  09:31:53 -------------------------------------------------------------------------------- Wound Assessment Details [name], [name] 04/27/2020 9:15 A M Medical Record Number: [id number] Patient Account Number: [id number] Date of Birth/Sex: Treating RN: 01-21-1956 (64 y.o. [name] Primary Care Halston Kintz: [name] Other Clinician: Referring Marybella Ethier: Treating Ariany Kesselman/Extender: Alexis Goodell Weeks in Treatment: 8 Wound Status Wound Number: 22 Primary Venous Leg Ulcer Etiology: Wound Location:  Right, Medial Lower Leg Wound Open Wounding Event: Gradually Appeared Status: Date Acquired: 12/23/2019 Comorbid Anemia, Sleep Apnea, Congestive Heart Failure, Hypertension, Weeks Of Treatment: 8 History: Peripheral Venous Disease, Type II Diabetes, Osteoarthritis, Clustered Wound: No Neuropathy Photos Photo Uploaded By: Mikeal Hawthorne on 04/27/2020 13:22:29 Wound Measurements Length: (cm) 3.5 Width: (cm) 2.5 Depth: (cm) 0.1 Area: (cm) 6.872 Volume: (cm) 0.687 % Reduction in Area: 69.6% % Reduction in Volume: 84.8% Epithelialization: None Tunneling: No Undermining: No Wound Description Classification: Full Thickness Without Exposed Support Structu Wound Margin: Flat and Intact Exudate Amount: Medium Exudate Type: Serosanguineous Exudate Color: red, brown res Foul Odor After Cleansing: No Slough/Fibrino Yes Wound Bed Granulation Amount: Medium (34-66%) Exposed Structure Granulation Quality: Red, Pink Fascia Exposed: No Necrotic Amount: Medium (34-66%) Fat Layer (Subcutaneous Tissue) Exposed: Yes Necrotic Quality: Adherent Slough Tendon Exposed: No Muscle Exposed: No Joint Exposed: No Bone Exposed: No Treatment Notes Wound #22 (Right, Medial Lower Leg) 2. Periwound Care Barrier cream Moisturizing lotion 3. Primary Dressing Applied Hydrofera Blue 4. Secondary Dressing ABD Pad Other secondary dressing  (specify in notes) 6. Support Layer Production assistant, radio Notes Academic librarian) Signed: 04/27/2020 5:04:18 PM By: [name] RN, BSN Entered By: [name] on 04/27/2020 09:32:15 -------------------------------------------------------------------------------- Vitals Details [name], [name] 04/27/2020 9:15 A M Medical Record Number: [id number] Patient Account Number: [id number] Date of Birth/Sex: Treating RN: 1955/10/27 (64 y.o. [name] Primary Care Issacc Merlo: [name] Other Clinician: Referring Evana Runnels: Treating Rhoderick Farrel/Extender: [name] in Treatment: 8 Vital Signs Time Taken: 09:08 Temperature (F): 99 Height (in): 77 Pulse (bpm): 62 Source: Stated Respiratory Rate (breaths/min): 18 Weight (lbs): 254 Blood Pressure (mmHg): 151/83 Source: Stated Capillary Blood Glucose (mg/dl): 141 Body Mass Index (BMI): 30.1 Reference Range: 80 - 120 mg / dl Notes glucose per pt report this am Electronic Signature(s) Signed: 04/27/2020 5:04:18 PM By: [name] RN, BSN Entered By: [name] on 04/27/2020 [place]

## 2020-04-27 NOTE — Progress Notes (Addendum)
[name], [name] ([id number]) Visit Report for 04/27/2020 Chief Complaint Document Details [name], [name] 04/27/2020 9:15 A M Medical Record Number: [id number] Patient Account Number: [id number] Date of Birth/Sex: Treating RN: [birth year] (64 y.o. [name] Primary Care Provider: [name] Other Clinician: Referring Provider: Treating Provider/Extender: [name] in Treatment: 8 Information Obtained from: Patient Chief Complaint Bilateral lower extremity wounds 02/25/2020; patient is back in clinic today for review of wounds on his left posterior greater than right lower extremity wounds Electronic Signature(s) Signed: 04/27/2020 9:30:51 AM By: Worthy Keeler PA-C Entered By: Worthy Keeler on 04/27/2020 09:30:50 -------------------------------------------------------------------------------- Debridement Details [name], [name] 04/27/2020 9:15 A M Medical Record Number: [id number] Patient Account Number: [id number] Date of Birth/Sex: Treating RN: [birth year] (64 y.o. [name] Primary Care Provider: [name] Other Clinician: Referring Provider: Treating Provider/Extender: [name] in Treatment: 8 Debridement Performed for Assessment: Wound #22 Right,Medial Lower Leg Performed By: Physician Worthy Keeler, PA Debridement Type: Debridement Severity of Tissue Pre Debridement: Fat layer exposed Level of Consciousness (Pre-procedure): Awake and Alert Pre-procedure Verification/Time Out Yes - 09:58 Taken: Start Time: 09:58 Pain Control: Other : Benzocaine 20% T Area Debrided (L x W): otal 3.5 (cm) x 2.5 (cm) = 8.75 (cm) Tissue and other material debrided: Viable, Non-Viable, Slough, Subcutaneous, Slough Level: Skin/Subcutaneous Tissue Debridement Description: Excisional Instrument: Curette Bleeding: Minimum Hemostasis Achieved: Pressure End  Time: 10:00 Procedural Pain: 0 Post Procedural Pain: 0 Response to Treatment: Procedure was tolerated well Level of Consciousness (Post- Awake and Alert procedure): Post Debridement Measurements of Total Wound Length: (cm) 3.5 Width: (cm) 2.5 Depth: (cm) 0.1 Volume: (cm) 0.687 Character of Wound/Ulcer Post Debridement: Improved Severity of Tissue Post Debridement: Fat layer exposed Post Procedure Diagnosis Same as Pre-procedure Electronic Signature(s) Signed: 04/27/2020 5:21:32 PM By: Levan Hurst RN, BSN Signed: 04/27/2020 6:02:08 PM By: Worthy Keeler PA-C Entered By: Levan Hurst on 04/27/2020 09:59:36 -------------------------------------------------------------------------------- Debridement Details [name], [name] 04/27/2020 9:15 A M Medical Record Number: [id number] Patient Account Number: [id number] Date of Birth/Sex: Treating RN: 1956-04-14 (64 y.o. [name] Primary Care Provider: [name] Other Clinician: Referring Provider: Treating Provider/Extender: [name] in Treatment: 8 Debridement Performed for Assessment: Wound #21 Left,Distal Lower Leg Performed By: Physician Worthy Keeler, PA Debridement Type: Debridement Severity of Tissue Pre Debridement: Fat layer exposed Level of Consciousness (Pre-procedure): Awake and Alert Pre-procedure Verification/Time Out Yes - 09:58 Taken: Start Time: 09:58 Pain Control: Other : Benzocaine 20% T Area Debrided (L x W): otal 3 (cm) x 3 (cm) = 9 (cm) Tissue and other material debrided: Viable, Non-Viable, Slough, Subcutaneous, Slough Level: Skin/Subcutaneous Tissue Debridement Description: Excisional Instrument: Curette Bleeding: Minimum Hemostasis Achieved: Pressure End Time: 10:00 Procedural Pain: 0 Post Procedural Pain: 0 Response to Treatment: Procedure was tolerated well Level of Consciousness (Post- Awake and Alert procedure): Post  Debridement Measurements of Total Wound Length: (cm) 16.2 Width: (cm) 17.5 Depth: (cm) 0.5 Volume: (cm) 111.33 Character of Wound/Ulcer Post Debridement: Improved Severity of Tissue Post Debridement: Fat layer exposed Post Procedure Diagnosis Same as Pre-procedure Electronic Signature(s) Signed: 04/27/2020 5:21:32 PM By: Levan Hurst RN, BSN Signed: 04/27/2020 6:02:08 PM By: Worthy Keeler PA-C Entered By: Levan Hurst on 04/27/2020 10:03:32 -------------------------------------------------------------------------------- HPI Details [name], [name] 04/27/2020 9:15 A M Medical Record Number:  [id number] Patient Account Number: [id number] Date of Birth/Sex: Treating RN: 27-Mar-1956 (64 y.o. [name] Primary Care Provider: [name] Other Clinician: Referring Provider: Treating Provider/Extender: [name] in Treatment: 8 History of Present Illness HPI Description: 07/07/15; this is a patient who is in hospital on 8/2 through 8/4. He had cellulitis and abscess of predominantly I think the left leg. He received IV antibiotics. Plain x-ray showed no osteomyelitis. An MRI of the left leg did not show osteomyelitis. Cultures showed no predominant organism. His hemoglobin A1c was 9.8. He has a history of venous stasis also peripheral vascular disease. He was discharged to [facility]. He really has extensive ulcerations on the left lateral leg including a major wound that communicates both posteriorly and superiorly w. When drainage coming out of 2 smaller areas. He has a smaller wound on the posterior medial left leg. He has more predominantly venous insufficiency wounds on predominantly the right medial leg above the ankle the right foot into sections. He has recently been put on Augmentin at the nursing home. Previous ABIs/arterial evaluation showed triphasic waves diffusely he does not have a major  ischemic issue a left lower extremity venous duplex also exam showed no evidence of a DVT on 6/21 07/21/15; the patient arrives with really no major change. Culture I did last time was negative. He has not had a follow-up MRI I ordered. The wounds are macerated covered with a thick gelatinous surface slough. There is necrotic subcutaneous tissue 08/04/15; the patient arrives with a considerable improvement in the majority of his wound area. The area on the left leg now has what looks to be a granulated base. Most of the wounds on the left leg required an aggressive surgical debridement to remove nonviable fibrinous eschar and subcutaneous tissue however after debridement most of this looks better. Although I had asked for repeat MRI of the left leg when he first came in here with grossly purulent material coming out of his wounds, it does not appear that this is been done and nor do I actually feel that strongly about it right now. He has severe surrounding venous insufficiency and inflammation. I don't believe he has significant PAD 08/25/15. In general there is still a considerable wound area here but with still extensive service adherent slough. The patient will not allow mechanical debridement due to pain. He did not tolerate Medihoney therefore we are left with Santyl for now. She would appear that he has severe surrounding venous stasis. There is no evidence of the infection that may have had something to do with the pathogenesis of these wounds 09/29/15; patient still has substantial wound area on the left leg with a cluster of several wounds on the lateral leg confluently on the left leg posteriorly and then a substantial wound on the medial leg. On the right leg a substantial wound medially and a small area on the right lateral foot. All of these underwent a substantial surgical debridement with curettes which she tolerated better than he has in the past. This started as a complex cellulitis in  the face of chronic venous insufficiency and inflammation. 10/13/15; substantial cluster of wounds on the lateral aspect of his left leg confluently around to the other side. Extensive surgical debridement to remove for redness surface slough nonviable subcutaneous tissue. This is not an improvement. Also substantial wound on the medial right leg which is largely unchanged. The etiology of this was felt to be a complex cellulitis  in the summer of 2016 in the face of chronic venous insufficiency and inflammation. The patient states that the wound on the right medial leg has been there for years off and on. 10/20/15; we are able to start [facility] to these extensive wound areas left greater than right on Tuesday after I spoke to the wound care nurse at the facility. He arrives here today for extensive surgical debridement for the wounds on the lateral aspect of his left leg posterior left leg, this is almost circumferential. He has a large wound on the medial aspect of his right leg although he finds this too painful for debridement 10/27/15; I continue to bring this patient back for frequent debridement/weekly debridement severe. We have been using Hydrofera Blue. Unfortunately this has not really had any improvement. The debridement surgery difficult and painful for the patient 11/23/15; this patient spent a complex hospitalization admitted with acute kidney injury, anemia, cellulitis of the lower extremity, he was felt to have sepsis pathophysiology although his blood cultures were negative. He had plain x-rays of both legs that were negative for osseous abnormalities. He was seen by infectious disease and placed on a workup for vasculitis that was negative including a biopsy 4 all were consistent with stasis dermatitis. Vital broad- spectrum antibiotics he continued to spike fevers. Urine and chest x-ray were negative Dopplers on admission ruled out a DVT All antibiotics were stopped on . 2/17.  Since his return to [facility] skilled nursing facility I believe they have been using Xeroform 12/07/15 the patient arrives today with the area on his right medial leg actually looking quite stable. No debridement. The rest of his extensive wounds on the left lateral left posterior extending into the left medial leg all required extensive debridement. I think this is probably going to need to and up in the hands of plastic surgery. We'll attempt to change him back to [facility]. Arrange consultation with plastic surgery at [facility] for this almost circumferential wound on the left side 12/21/15 right leg covered in surface slough. This was debridement. Left leg extensive wounds all carefully examined. This is almost circumferential on the left side especially on the posterior calf. No debridement is necessary. 01/04/16 the patient has been to [facility] plastic surgery. Unfortunately it doesn't look like they had any of the prior workup on this patient. They're in the middle of vascular workup. It sounds as though they're applying Hydrofera Blue at the nursing home. 01/22/16; the patient has been back to see [facility]. There apparently making plans to possibly do skin grafts over his large venous insufficiency ulcerations. The patient tells me that he goes to hematology in [facility] and variably uses the term platelets red cells and the description of his problem. 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 I do not see any major difference in the wound on the right medial leg and circumferentially across the left medial to left lateral leg. Did not attempt to debride these today. Readmission: 05/05/18 on evaluation today patient presents for reevaluation here in our clinic although I have previously seen him in the skilled nursing facility over the past year and a half when I was working in the skilled nursing facility realm instead of covering in the clinic.  With that being said during that time we had initiated most recently dressings with Hydrofera Blue Dressing along with the [employer] which had done very well for him. Much better than  the Kerlex Coban wraps. With that being said the patient had been doing fairly well and was making progress when I last saw him. Upon evaluation today it appears that the wounds are actually a little bit worse than when I last saw him although definitely not dramatically so. There does not appear to be any evidence of infection which is good news. With that being said he has been tolerating the wraps without complication his left hip still gives him a lot of trouble which is his main issue as far as movement is concerned. Unbeknownst to me he has not had venous studies that I can find. He previously told me he had there were arterial studies noted back from 04/27/15 which showed that he had try phasic blood flow throughout and again his test appear to be completely normal as performed by Dr. Creig Hines. With that being said I cannot find where he had venous studies. The patient still states he thinks he did these definitely had no venous intervention at this point. Upon evaluation today it does not appear to me that the patient has any evidence of cellulitis. He does have some chronic venous insufficiency which I think has led to stasis dermatitis but again this is not appearing to be infected at this point. No fevers, chills, nausea, or vomiting noted at this time. READMISSION 06/30/2018 This is a now 64 year old man we have had in this clinic at multiple times in the past. Most recently he came in August and was seen by [facility] stone. It is not clear why he did not come back we asked him really did not get a straight answer. He is at [facility] skilled facility he is a man who has severe chronic venous insufficiency with lymphedema and has had severe wounds on his left greater than right leg. We had him here in 2016  and 17 ultimately referred him to [facility]. He had arterial studies and venous studies done at [facility] although I am not able to access these records I see they are actually done. He also had multiple biopsies that were negative for malignancy showing changes compatible with chronic inflammation. As I understand things in [place] they are using Iodoflex and Unna boots. I am not really sure how they are getting enough Iodoflex for the large wound area especially on his left calf. Nevertheless the wounds look better than when I saw this in 2017. There were plans for him to have plastic surgery in 2018 and I think he was actually prepped for surgery however he was ultimately denied because the patient was an active smoker. The patient is not systemically unwell. The wounds are painful however given the size especially on the left this is not surprising. ABIs in this clinic were 0.78 on the left and 0.91 on the right 07/13/2018; this is a patient with bilateral severe venous ulcers with secondary lymphedema. The wounds are on the right medial calf and a substantial part of the left posterior calf and some involvement medially and laterally on the left. We changed him to silver alginate under Unna boot's last time. The wounds actually look fairly satisfactory today. 08/14/2018; this is a patient with severe bilateral venous stasis ulcers with secondary lymphedema left greater than right he is at [facility] using silver alginate under Unna boot's I do not think there is much change on this visit versus last time I saw him a month ago Readmission: 11/04/18 patient presents today for follow-up concerning his  bilateral lower extremity ulcers. His a right medial ankle ulcer and a left leg that has ulcers over a large proportion of the surface area between the ankle and knee. Unfortunately this does cause some discomfort for the patient although it doesn't seem to be as uncomfortable as it has been  in the past. He is seen today for evaluation after a referral by [place] back to the clinic. Unfortunately has a lot of Slough noted on the surface of his wounds. He's been treated currently it sounds like with Dakin's soaked gauze and they have not been performing the Unna Boot wrap that was previously recommended. I'm not exactly sure what the reason for the change was. He does have less slough on the surface of the wound but again this is something that is often a constant issue for him. Again he's had these wounds for many years. I've known him for two years at least during that time when I was taking care of them in the facility he is Artie had the wounds for several years prior. READMISSION 02/25/2020 [name] is a now 64 year old man. He has been in our clinic several times before most extensively in October 2016 through May 2017. At that time he had absolutely substantial bilateral lower extremity wounds secondary to chronic venous insufficiency and lymphedema. We ultimately referred him to [facility] he had a series of biopsies only showed suggestion of wound secondary to chronic venous insufficiency. He had a less extensive stay in our clinic in 2019 and then a single visit in February 2020. He is a resident at [facility] skilled facility. I think he has had intermittently had in facility wound care. He returns today. They are using silver alginate on the wounds although I am not sure what type of compression. Previously he has favored Unna boots. He is not felt to have an arterial issue. Past medical history; lymphedema and chronic venous insufficiency, diabetes mellitus, hypertension, congestive heart failure We did not do arterial studies today 04/27/2020 on evaluation patient appears to be doing really about the same as when I have known him and seen him in the past. He does have wounds on the right and left legs at this point. Fortunately there is no signs of active infection at this  time. Electronic Signature(s) Signed: 04/27/2020 5:28:17 PM By: Worthy Keeler PA-C Entered By: Worthy Keeler on 04/27/2020 17:28:17 -------------------------------------------------------------------------------- Physical Exam Details [name], [name] 04/27/2020 9:15 A M Medical Record Number: [id number] Patient Account Number: [id number] Date of Birth/Sex: Treating RN: 04-28-1956 (64 y.o. [name] Primary Care Provider: [name] Other Clinician: Referring Provider: Treating Provider/Extender: Alexis Goodell Weeks in Treatment: 8 Constitutional Well-nourished and well-hydrated in no acute distress. Respiratory normal breathing without difficulty. Psychiatric this patient is able to make decisions and demonstrates good insight into disease process. Alert and Oriented x 3. pleasant and cooperative. Notes Upon inspection patient's wound on the right medial ankle did require sharp debridement to remove some of the slough from this location. He also did have some slough in the left leg more posterior/medial that also required debridement. I was able to clear away both locations without complication and post debridement wound beds appear to be doing much better. Electronic Signature(s) Signed: 04/27/2020 5:28:44 PM By: Worthy Keeler PA-C Entered By: Worthy Keeler on 04/27/2020 17:28:44 -------------------------------------------------------------------------------- Physician Orders Details [name], [name] 04/27/2020 9:15 A M Medical  Record Number: [id number] Patient Account Number: [id number] Date of Birth/Sex: Treating RN: [birth year] (64 y.o. [name] Primary Care Provider: [name] Other Clinician: Referring Provider: Treating Provider/Extender: [name] in Treatment: 8 Verbal / Phone Orders: No Diagnosis Coding ICD-10 Coding Code  Description [phone] Chronic venous hypertension (idiopathic) with ulcer and inflammation of bilateral lower extremity I89.0 Lymphedema, not elsewhere classified L97.222 Non-pressure chronic ulcer of left calf with fat layer exposed L97.212 Non-pressure chronic ulcer of right calf with fat layer exposed Follow-up Appointments Return appointment in 1 month. Dressing Change Frequency Change dressing three times week. Skin Barriers/Peri-Wound Care Barrier cream Moisturizing lotion Wound Cleansing Clean wound with Wound Cleanser - or normal saline Primary Wound Dressing Wound #20 Left,Lateral Lower Leg Hydrofera Blue - Classic Wound #21 Left,Distal Lower Leg Hydrofera Blue - Classic Wound #22 Right,Medial Lower Leg Hydrofera Blue - Classic Secondary Dressing Wound #20 Left,Lateral Lower Leg ABD pad Zetuvit or Kerramax - or extra absorbent pad Wound #21 Left,Distal Lower Leg ABD pad Zetuvit or Kerramax - or extra absorbent pad Wound #22 Right,Medial Lower Leg ABD pad Zetuvit or Kerramax - or extra absorbent pad Edema Control Unna Boots Bilaterally Avoid standing for long periods of time Elevate legs to the level of the heart or above for 30 minutes daily and/or when sitting, a frequency of: - throughout the day Electronic Signature(s) Signed: 04/27/2020 5:21:32 PM By: Levan Hurst RN, BSN Signed: 04/27/2020 6:02:08 PM By: Worthy Keeler PA-C Entered By: Levan Hurst on 04/27/2020 10:04:59 -------------------------------------------------------------------------------- Problem List Details [name], [name] 04/27/2020 9:15 A M Medical Record Number: [id number] Patient Account Number: [id number] Date of Birth/Sex: Treating RN: 03/06/1956 (64 y.o. [name] Primary Care Provider: [name] Other Clinician: Referring Provider: Treating Provider/Extender: [name] in Treatment: 8 Active  Problems ICD-10 Encounter Code Description Active Date MDM Diagnosis I87.333 Chronic venous hypertension (idiopathic) with ulcer and inflammation of 02/25/2020 No Yes bilateral lower extremity I89.0 Lymphedema, not elsewhere classified 02/25/2020 No Yes L97.222 Non-pressure chronic ulcer of left calf with fat layer exposed 02/25/2020 No Yes L97.212 Non-pressure chronic ulcer of right calf with fat layer exposed 02/25/2020 No Yes Inactive Problems Resolved Problems Electronic Signature(s) Signed: 04/27/2020 9:30:41 AM By: Worthy Keeler PA-C Entered By: Worthy Keeler on 04/27/2020 09:30:41 -------------------------------------------------------------------------------- Progress Note Details [name], [name] 04/27/2020 9:15 A M Medical Record Number: [id number] Patient Account Number: [id number] Date of Birth/Sex: Treating RN: 12/18/1955 (64 y.o. [name] Primary Care Provider: [name] Other Clinician: Referring Provider: Treating Provider/Extender: [name] in Treatment: 8 Subjective Chief Complaint Information obtained from Patient Bilateral lower extremity wounds 02/25/2020; patient is back in clinic today for review of wounds on his left posterior greater than right lower extremity wounds History of Present Illness (HPI) 07/07/15; this is a patient who is in hospital on 8/2 through 8/4. He had cellulitis and abscess of predominantly I think the left leg. He received IV antibiotics. Plain x-ray showed no osteomyelitis. An MRI of the left leg did not show osteomyelitis. Cultures showed no predominant organism. His hemoglobin A1c was 9.8. He has a history of venous stasis also peripheral vascular disease. He was discharged to [facility] home. He really has extensive ulcerations on the left lateral leg including a major wound that communicates both posteriorly and superiorly w. When drainage coming out of 2  smaller  areas. He has a smaller wound on the posterior medial left leg. He has more predominantly venous insufficiency wounds on predominantly the right medial leg above the ankle the right foot into sections. He has recently been put on Augmentin at the nursing home. Previous ABIs/arterial evaluation showed triphasic waves diffusely he does not have a major ischemic issue a left lower extremity venous duplex also exam showed no evidence of a DVT on 6/21 07/21/15; the patient arrives with really no major change. Culture I did last time was negative. He has not had a follow-up MRI I ordered. The wounds are macerated covered with a thick gelatinous surface slough. There is necrotic subcutaneous tissue 08/04/15; the patient arrives with a considerable improvement in the majority of his wound area. The area on the left leg now has what looks to be a granulated base. Most of the wounds on the left leg required an aggressive surgical debridement to remove nonviable fibrinous eschar and subcutaneous tissue however after debridement most of this looks better. Although I had asked for repeat MRI of the left leg when he first came in here with grossly purulent material coming out of his wounds, it does not appear that this is been done and nor do I actually feel that strongly about it right now. He has severe surrounding venous insufficiency and inflammation. I don't believe he has significant PAD 08/25/15. In general there is still a considerable wound area here but with still extensive service adherent slough. The patient will not allow mechanical debridement due to pain. He did not tolerate Medihoney therefore we are left with Santyl for now. She would appear that he has severe surrounding venous stasis. There is no evidence of the infection that may have had something to do with the pathogenesis of these wounds 09/29/15; patient still has substantial wound area on the left leg with a cluster of several wounds  on the lateral leg confluently on the left leg posteriorly and then a substantial wound on the medial leg. On the right leg a substantial wound medially and a small area on the right lateral foot. All of these underwent a substantial surgical debridement with curettes which she tolerated better than he has in the past. This started as a complex cellulitis in the face of chronic venous insufficiency and inflammation. 10/13/15; substantial cluster of wounds on the lateral aspect of his left leg confluently around to the other side. Extensive surgical debridement to remove for redness surface slough nonviable subcutaneous tissue. This is not an improvement. Also substantial wound on the medial right leg which is largely unchanged. The etiology of this was felt to be a complex cellulitis in the summer of 2016 in the face of chronic venous insufficiency and inflammation. The patient states that the wound on the right medial leg has been there for years off and on. 10/20/15; we are able to start [facility] to these extensive wound areas left greater than right on Tuesday after I spoke to the wound care nurse at the facility. He arrives here today for extensive surgical debridement for the wounds on the lateral aspect of his left leg posterior left leg, this is almost circumferential. He has a large wound on the medial aspect of his right leg although he finds this too painful for debridement 10/27/15; I continue to bring this patient back for frequent debridement/weekly debridement severe. We have been using Hydrofera Blue. Unfortunately this has not really had any improvement. The debridement surgery difficult and painful for  the patient 11/23/15; this patient spent a complex hospitalization admitted with acute kidney injury, anemia, cellulitis of the lower extremity, he was felt to have sepsis pathophysiology although his blood cultures were negative. He had plain x-rays of both legs that were negative for  osseous abnormalities. He was seen by infectious disease and placed on a workup for vasculitis that was negative including a biopsy o4 all were consistent with stasis dermatitis. Vital broad-spectrum antibiotics he continued to spike fevers. Urine and chest x-ray were negative Dopplers on admission ruled out a DVT All antibiotics were stopped on 2/17. . Since his return to [facility] skilled nursing facility I believe they have been using Xeroform 12/07/15 the patient arrives today with the area on his right medial leg actually looking quite stable. No debridement. The rest of his extensive wounds on the left lateral left posterior extending into the left medial leg all required extensive debridement. I think this is probably going to need to and up in the hands of plastic surgery. We'll attempt to change him back to [facility]. Arrange consultation with plastic surgery at [facility] for this almost circumferential wound on the left side 12/21/15 right leg covered in surface slough. This was debridement. Left leg extensive wounds all carefully examined. This is almost circumferential on the left side especially on the posterior calf. No debridement is necessary. 01/04/16 the patient has been to [facility] plastic surgery. Unfortunately it doesn't look like they had any of the prior workup on this patient. They're in the middle of vascular workup. It sounds as though they're applying Hydrofera Blue at the nursing home. 01/22/16; the patient has been back to see [facility]. There apparently making plans to possibly do skin grafts over his large venous insufficiency ulcerations. The patient tells me that he goes to hematology in [facility] and variably uses the term platelets red cells and the description of his problem. 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 I do not see any major difference in the wound on the right medial leg and circumferentially across the  left medial to left lateral leg. Did not attempt to debride these today. Readmission: 05/05/18 on evaluation today patient presents for reevaluation here in our clinic although I have previously seen him in the skilled nursing facility over the past year and a half when I was working in the skilled nursing facility realm instead of covering in the clinic. With that being said during that time we had initiated most recently dressings with Hydrofera Blue Dressing along with the [employer] which had done very well for him. Much better than the Kerlex Coban wraps. With that being said the patient had been doing fairly well and was making progress when I last saw him. Upon evaluation today it appears that the wounds are actually a little bit worse than when I last saw him although definitely not dramatically so. There does not appear to be any evidence of infection which is good news. With that being said he has been tolerating the wraps without complication his left hip still gives him a lot of trouble which is his main issue as far as movement is concerned. Unbeknownst to me he has not had venous studies that I can find. He previously told me he had there were arterial studies noted back from 04/27/15 which showed that he had try phasic blood flow throughout and again his test appear to be completely normal as performed by  Dr. Creig Hines. With that being said I cannot find where he had venous studies. The patient still states he thinks he did these definitely had no venous intervention at this point. Upon evaluation today it does not appear to me that the patient has any evidence of cellulitis. He does have some chronic venous insufficiency which I think has led to stasis dermatitis but again this is not appearing to be infected at this point. No fevers, chills, nausea, or vomiting noted at this time. READMISSION 06/30/2018 This is a now 64 year old man we have had in this clinic at multiple times in  the past. Most recently he came in August and was seen by [facility] stone. It is not clear why he did not come back we asked him really did not get a straight answer. He is at [facility] skilled facility he is a man who has severe chronic venous insufficiency with lymphedema and has had severe wounds on his left greater than right leg. We had him here in 2016 and 17 ultimately referred him to [facility]. He had arterial studies and venous studies done at [facility] although I am not able to access these records I see they are actually done. He also had multiple biopsies that were negative for malignancy showing changes compatible with chronic inflammation. As I understand things in [place] they are using Iodoflex and Unna boots. I am not really sure how they are getting enough Iodoflex for the large wound area especially on his left calf. Nevertheless the wounds look better than when I saw this in 2017. There were plans for him to have plastic surgery in 2018 and I think he was actually prepped for surgery however he was ultimately denied because the patient was an active smoker. The patient is not systemically unwell. The wounds are painful however given the size especially on the left this is not surprising. ABIs in this clinic were 0.78 on the left and 0.91 on the right 07/13/2018; this is a patient with bilateral severe venous ulcers with secondary lymphedema. The wounds are on the right medial calf and a substantial part of the left posterior calf and some involvement medially and laterally on the left. We changed him to silver alginate under Unna boot's last time. The wounds actually look fairly satisfactory today. 08/14/2018; this is a patient with severe bilateral venous stasis ulcers with secondary lymphedema left greater than right he is at [facility] using silver alginate under Unna boot's I do not think there is much change on this visit versus last time I saw him a month  ago Readmission: 11/04/18 patient presents today for follow-up concerning his bilateral lower extremity ulcers. His a right medial ankle ulcer and a left leg that has ulcers over a large proportion of the surface area between the ankle and knee. Unfortunately this does cause some discomfort for the patient although it doesn't seem to be as uncomfortable as it has been in the past. He is seen today for evaluation after a referral by [place] back to the clinic. Unfortunately has a lot of Slough noted on the surface of his wounds. He's been treated currently it sounds like with Dakin's soaked gauze and they have not been performing the Unna Boot wrap that was previously recommended. I'm not exactly sure what the reason for the change was. He does have less slough on the surface of the wound but again this is something that is often a constant issue for him. Again he's  had these wounds for many years. I've known him for two years at least during that time when I was taking care of them in the facility he is Artie had the wounds for several years prior. READMISSION 02/25/2020 [name] is a now 64 year old man. He has been in our clinic several times before most extensively in October 2016 through May 2017. At that time he had absolutely substantial bilateral lower extremity wounds secondary to chronic venous insufficiency and lymphedema. We ultimately referred him to [facility] he had a series of biopsies only showed suggestion of wound secondary to chronic venous insufficiency. He had a less extensive stay in our clinic in 2019 and then a single visit in February 2020. He is a resident at [facility] skilled facility. I think he has had intermittently had in facility wound care. He returns today. They are using silver alginate on the wounds although I am not sure what type of compression. Previously he has favored Unna boots. He is not felt to have an arterial issue. Past medical history; lymphedema and  chronic venous insufficiency, diabetes mellitus, hypertension, congestive heart failure We did not do arterial studies today 04/27/2020 on evaluation patient appears to be doing really about the same as when I have known him and seen him in the past. He does have wounds on the right and left legs at this point. Fortunately there is no signs of active infection at this time. Objective Constitutional Well-nourished and well-hydrated in no acute distress. Vitals Time Taken: 9:08 AM, Height: 77 in, Source: Stated, Weight: 254 lbs, Source: Stated, BMI: 30.1, Temperature: 99 F, Pulse: 62 bpm, Respiratory Rate: 18 breaths/min, Blood Pressure: 151/83 mmHg, Capillary Blood Glucose: 141 mg/dl. General Notes: glucose per pt report this am Respiratory normal breathing without difficulty. Psychiatric this patient is able to make decisions and demonstrates good insight into disease process. Alert and Oriented x 3. pleasant and cooperative. General Notes: Upon inspection patient's wound on the right medial ankle did require sharp debridement to remove some of the slough from this location. He also did have some slough in the left leg more posterior/medial that also required debridement. I was able to clear away both locations without complication and post debridement wound beds appear to be doing much better. Integumentary (Hair, Skin) Wound #20 status is Open. Original cause of wound was Gradually Appeared. The wound is located on the Left,Lateral Lower Leg. The wound measures 5.9cm length x 4.1cm width x 0.5cm depth; 18.999cm^2 area and 9.499cm^3 volume. There is Fat Layer (Subcutaneous Tissue) Exposed exposed. There is no tunneling or undermining noted. There is a medium amount of serosanguineous drainage noted. The wound margin is flat and intact. There is medium (34-66%) red, pink granulation within the wound bed. There is a medium (34-66%) amount of necrotic tissue within the wound bed including Adherent  Slough. Wound #21 status is Open. Original cause of wound was Gradually Appeared. The wound is located on the Left,Distal Lower Leg. The wound measures 16.2cm length x 17.5cm width x 0.5cm depth; 222.66cm^2 area and 111.33cm^3 volume. There is Fat Layer (Subcutaneous Tissue) Exposed exposed. There is no tunneling or undermining noted. There is a large amount of serosanguineous drainage noted. The wound margin is distinct with the outline attached to the wound base. There is medium (34-66%) red granulation within the wound bed. There is a medium (34-66%) amount of necrotic tissue within the wound bed including Adherent Slough. General Notes: excoriated wound margins Wound #22 status is Open. Original cause  of wound was Gradually Appeared. The wound is located on the Right,Medial Lower Leg. The wound measures 3.5cm length x 2.5cm width x 0.1cm depth; 6.872cm^2 area and 0.687cm^3 volume. There is Fat Layer (Subcutaneous Tissue) Exposed exposed. There is no tunneling or undermining noted. There is a medium amount of serosanguineous drainage noted. The wound margin is flat and intact. There is medium (34-66%) red, pink granulation within the wound bed. There is a medium (34-66%) amount of necrotic tissue within the wound bed including Adherent Slough. Assessment Active Problems ICD-10 Chronic venous hypertension (idiopathic) with ulcer and inflammation of bilateral lower extremity Lymphedema, not elsewhere classified Non-pressure chronic ulcer of left calf with fat layer exposed Non-pressure chronic ulcer of right calf with fat layer exposed Procedures Wound #21 Pre-procedure diagnosis of Wound #21 is a Venous Leg Ulcer located on the Left,Distal Lower Leg .Severity of Tissue Pre Debridement is: Fat layer exposed. There was a Excisional Skin/Subcutaneous Tissue Debridement with a total area of 9 sq cm performed by Worthy Keeler, PA. With the following instrument(s): Curette to remove Viable and  Non-Viable tissue/material. Material removed includes Subcutaneous Tissue and Slough and after achieving pain control using Other (Benzocaine 20%). No specimens were taken. A time out was conducted at 09:58, prior to the start of the procedure. A Minimum amount of bleeding was controlled with Pressure. The procedure was tolerated well with a pain level of 0 throughout and a pain level of 0 following the procedure. Post Debridement Measurements: 16.2cm length x 17.5cm width x 0.5cm depth; 111.33cm^3 volume. Character of Wound/Ulcer Post Debridement is improved. Severity of Tissue Post Debridement is: Fat layer exposed. Post procedure Diagnosis Wound #21: Same as Pre-Procedure Pre-procedure diagnosis of Wound #21 is a Venous Leg Ulcer located on the Left,Distal Lower Leg . There was a Haematologist Compression Therapy Procedure by Levan Hurst, RN. Post procedure Diagnosis Wound #21: Same as Pre-Procedure Wound #22 Pre-procedure diagnosis of Wound #22 is a Venous Leg Ulcer located on the Right,Medial Lower Leg .Severity of Tissue Pre Debridement is: Fat layer exposed. There was a Excisional Skin/Subcutaneous Tissue Debridement with a total area of 8.75 sq cm performed by Worthy Keeler, PA. With the following instrument(s): Curette to remove Viable and Non-Viable tissue/material. Material removed includes Subcutaneous Tissue and Slough and after achieving pain control using Other (Benzocaine 20%). No specimens were taken. A time out was conducted at 09:58, prior to the start of the procedure. A Minimum amount of bleeding was controlled with Pressure. The procedure was tolerated well with a pain level of 0 throughout and a pain level of 0 following the procedure. Post Debridement Measurements: 3.5cm length x 2.5cm width x 0.1cm depth; 0.687cm^3 volume. Character of Wound/Ulcer Post Debridement is improved. Severity of Tissue Post Debridement is: Fat layer exposed. Post procedure Diagnosis Wound #22:  Same as Pre-Procedure Pre-procedure diagnosis of Wound #22 is a Venous Leg Ulcer located on the Right,Medial Lower Leg . There was a Haematologist Compression Therapy Procedure by Levan Hurst, RN. Post procedure Diagnosis Wound #22: Same as Pre-Procedure Wound #20 Pre-procedure diagnosis of Wound #20 is a Venous Leg Ulcer located on the Left,Lateral Lower Leg . There was a Haematologist Compression Therapy Procedure by Levan Hurst, RN. Post procedure Diagnosis Wound #20: Same as Pre-Procedure Plan Follow-up Appointments: Return appointment in 1 month. Dressing Change Frequency: Change dressing three times week. Skin Barriers/Peri-Wound Care: Barrier cream Moisturizing lotion Wound Cleansing: Clean wound with Wound Cleanser - or normal saline Primary Wound  Dressing: Wound #20 Left,Lateral Lower Leg: Hydrofera Blue - Classic Wound #21 Left,Distal Lower Leg: Hydrofera Blue - Classic Wound #22 Right,Medial Lower Leg: Hydrofera Blue - Classic Secondary Dressing: Wound #20 Left,Lateral Lower Leg: ABD pad Zetuvit or Kerramax - or extra absorbent pad Wound #21 Left,Distal Lower Leg: ABD pad Zetuvit or Kerramax - or extra absorbent pad Wound #22 Right,Medial Lower Leg: ABD pad Zetuvit or Kerramax - or extra absorbent pad Edema Control: Unna Boots Bilaterally Avoid standing for long periods of time Elevate legs to the level of the heart or above for 30 minutes daily and/or when sitting, a frequency of: - throughout the day 1. I would recommend currently that we continue with the [facility] which is what actually has been used at the facility I think that seems to be doing a great job for him all things considered. 2. I am also can recommend that we continue with Unna boots bilaterally as I feel like that is about the best thing that he can tolerate. 3. I would also recommend that the patient try to elevate his legs much as possible though due to his hip issues he has difficulty  doing so. We will see patient back for reevaluation in 1 month here in the clinic. If anything worsens or changes patient will contact our office for additional recommendations. Electronic Signature(s) Signed: 04/27/2020 5:43:00 PM By: Worthy Keeler PA-C Entered By: Worthy Keeler on 04/27/2020 17:43:00 -------------------------------------------------------------------------------- SuperBill Details [name], [name] 04/27/2020 Medical Record Number: [id number] Patient Account Number: [id number] Date of Birth/Sex: Treating RN: 05-12-1956 (64 y.o. [name] Primary Care Provider: [name] Other Clinician: Referring Provider: Treating Provider/Extender: [name] in Treatment: 8 Diagnosis Coding ICD-10 Codes Code Description [phone] Chronic venous hypertension (idiopathic) with ulcer and inflammation of bilateral lower extremity I89.0 Lymphedema, not elsewhere classified L97.222 Non-pressure chronic ulcer of left calf with fat layer exposed L97.212 Non-pressure chronic ulcer of right calf with fat layer exposed Facility Procedures CPT4 Code: [id number] Description: [place] - DEB SUBQ TISSUE 20 SQ CM/< ICD-10 Diagnosis Description L97.222 Non-pressure chronic ulcer of left calf with fat layer exposed L97.212 Non-pressure chronic ulcer of right calf with fat layer exposed Modifier: Quantity: 1 Physician Procedures : CPT4 Code Description Modifier [id number] - WC PHYS SUBQ TISS 20 SQ CM ICD-10 Diagnosis Description L97.222 Non-pressure chronic ulcer of left calf with fat layer exposed L97.212 Non-pressure chronic ulcer of right calf with fat layer exposed Quantity: 1 Electronic Signature(s) Signed: 04/27/2020 5:43:15 PM By: Worthy Keeler PA-C Entered By: Worthy Keeler on 04/27/2020 17:43:14

## 2020-05-08 ENCOUNTER — Emergency Department (HOSPITAL_COMMUNITY)
Admission: EM | Admit: 2020-05-08 | Discharge: 2020-05-08 | Disposition: A | Discharge status: Home / Self Care | Payer: No Typology Code available for payment source | Attending: Emergency Medicine | Admitting: Emergency Medicine

## 2020-05-08 DIAGNOSIS — E119 Type 2 diabetes mellitus without complications: Secondary | ICD-10-CM | POA: Diagnosis not present

## 2020-05-08 DIAGNOSIS — M79605 Pain in left leg: Secondary | ICD-10-CM | POA: Diagnosis present

## 2020-05-08 DIAGNOSIS — F17210 Nicotine dependence, cigarettes, uncomplicated: Secondary | ICD-10-CM | POA: Diagnosis not present

## 2020-05-08 DIAGNOSIS — I110 Hypertensive heart disease with heart failure: Secondary | ICD-10-CM | POA: Diagnosis not present

## 2020-05-08 DIAGNOSIS — Z7982 Long term (current) use of aspirin: Secondary | ICD-10-CM | POA: Insufficient documentation

## 2020-05-08 DIAGNOSIS — Z79899 Other long term (current) drug therapy: Secondary | ICD-10-CM | POA: Diagnosis not present

## 2020-05-08 DIAGNOSIS — M79604 Pain in right leg: Secondary | ICD-10-CM | POA: Diagnosis not present

## 2020-05-08 DIAGNOSIS — I5031 Acute diastolic (congestive) heart failure: Secondary | ICD-10-CM | POA: Diagnosis not present

## 2020-05-08 DIAGNOSIS — Z794 Long term (current) use of insulin: Secondary | ICD-10-CM | POA: Diagnosis not present

## 2020-05-08 LAB — CBC WITH DIFFERENTIAL/PLATELET
Abs Immature Granulocytes: 0.03 10*3/uL (ref 0.00–0.07)
Basophils Absolute: 0 10*3/uL (ref 0.0–0.1)
Basophils Relative: 1 %
Eosinophils Absolute: 0.1 10*3/uL (ref 0.0–0.5)
Eosinophils Relative: 2 %
HCT: 37 % — ABNORMAL LOW (ref 39.0–52.0)
Hemoglobin: 10.1 g/dL — ABNORMAL LOW (ref 13.0–17.0)
Immature Granulocytes: 1 %
Lymphocytes Relative: 29 %
Lymphs Abs: 1.8 10*3/uL (ref 0.7–4.0)
MCH: 18.8 pg — ABNORMAL LOW (ref 26.0–34.0)
MCHC: 27.3 g/dL — ABNORMAL LOW (ref 30.0–36.0)
MCV: 69 fL — ABNORMAL LOW (ref 80.0–100.0)
Monocytes Absolute: 0.6 10*3/uL (ref 0.1–1.0)
Monocytes Relative: 10 %
Neutro Abs: 3.6 10*3/uL (ref 1.7–7.7)
Neutrophils Relative %: 57 %
Platelets: 250 10*3/uL (ref 150–400)
RBC: 5.36 MIL/uL (ref 4.22–5.81)
RDW: 20.2 % — ABNORMAL HIGH (ref 11.5–15.5)
WBC: 6.2 10*3/uL (ref 4.0–10.5)
nRBC: 0 % (ref 0.0–0.2)

## 2020-05-08 LAB — BASIC METABOLIC PANEL
Anion gap: 8 (ref 5–15)
BUN: <5 mg/dL — ABNORMAL LOW (ref 8–23)
CO2: 32 mmol/L (ref 22–32)
Calcium: 8.6 mg/dL — ABNORMAL LOW (ref 8.9–10.3)
Chloride: 98 mmol/L (ref 98–111)
Creatinine, Ser: 0.68 mg/dL (ref 0.61–1.24)
GFR calc Af Amer: >60 mL/min (ref >60)
GFR calc non Af Amer: >60 mL/min (ref >60)
Glucose, Bld: 163 mg/dL — ABNORMAL HIGH (ref 70–99)
Potassium: 3.9 mmol/L (ref 3.5–5.1)
Sodium: 138 mmol/L (ref 135–145)

## 2020-05-08 LAB — CBG MONITORING, ED
Glucose-Capillary: 192 mg/dL — ABNORMAL HIGH (ref 70–99)
Glucose-Capillary: 147 mg/dL — ABNORMAL HIGH (ref 70–99)

## 2020-05-08 MED ORDER — OXYCODONE HCL 5 MG PO TABS: 5.0000 mg | ORAL_TABLET | Freq: Four times a day (QID) | ORAL | 0 refills | Status: DC | PRN

## 2020-05-08 MED ORDER — FENTANYL CITRATE (PF) 100 MCG/2ML IJ SOLN
50.0000 ug | Freq: Once | INTRAMUSCULAR | Status: AC
  Administered 2020-05-08: 50 ug via INTRAVENOUS
  Filled 2020-05-08: qty 2

## 2020-05-08 NOTE — ED Triage Notes (Signed)
Pt here from [place] home c/o bil leg pain , hx of ulcers on both legs and pt is diabetic

## 2020-05-08 NOTE — ED Notes (Signed)
This RN attempted again to give report to [facility] with no response from facility. [facility] made aware.

## 2020-05-08 NOTE — Discharge Instructions (Addendum)
Continue ongoing wound care.  Use Roxicodone for breakthrough pain, follow-up closely with the wound doctor, please return if you develop any fever, any signs of infectious process in the legs

## 2020-05-08 NOTE — ED Notes (Signed)
[facility] ay Bedside; report given

## 2020-05-08 NOTE — ED Notes (Signed)
Patient verbalizes understanding of discharge instructions. Opportunity for questioning and answers were provided. Pt is now awaiting transport back to facility through [facility].  

## 2020-05-08 NOTE — ED Notes (Signed)
This RN attempted twice to give report to [facility] with no response from facility. This RN will continue to attempt report.

## 2020-05-08 NOTE — ED Provider Notes (Signed)
[facility] Provider Note   CSN: [id number] Arrival date & time: 05/08/20  0645     History Chief Complaint  Patient presents with   Leg Pain    [name] is a 64 y.o. male.  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  Followed by wound care.  Pain worse in his lower left leg than his right intermittent at times.  He is to have narcotic pain medicine for breakthrough pain.  No longer currently on antibiotics.  The history is provided by the patient.  Illness Location:  Legs Severity:  Mild Onset quality:  Gradual Timing:  Intermittent Progression:  Waxing and waning Chronicity:  Recurrent Associated symptoms: no abdominal pain, no chest pain, no cough, no ear pain, no fever, no rash, no shortness of breath, no sore throat and no vomiting        Past Medical History:  Diagnosis Date   Anemia    Arthritis    "left hip" (04/26/2015)   Cellulitis and abscess of leg hositalized 04/25/2015   left   Chronic hip pain    Depression    GERD (gastroesophageal reflux disease)    Hypercholesterolemia    Hypertension    Pneumonia 1960's X 1   Sleep apnea    "couldn't wear the mask" (04/26/2015)   Thalassemia minor    Type II diabetes mellitus ([place])     Patient Active Problem List   Diagnosis Date Noted   Dyspnea 02/12/2019   Pain in left wrist 71/24/5809   Acute diastolic heart failure ([place]) 08/25/2016   Hypoxia 08/21/2016   Acute respiratory failure with hypoxia and hypercapnia ([place]) 08/21/2016   Moderate protein-calorie malnutrition ([place]) 08/21/2016   Stasis edema of both lower extremities 08/21/2016   Chronic pain 08/21/2016   AKI (acute kidney injury) ([place]) 11/02/2015   Hyperkalemia 11/02/2015   Hypoglycemia 11/02/2015   Cellulitis    Leg ulcer ([place])    Nonhealing ulcer of left lower extremity ([place])    Pain of left leg    Cellulitis of leg 04/25/2015    Degenerative arthritis of hip 04/25/2015   Cellulitis and abscess of leg    Microcytic anemia 03/27/2015   Hypertension    Fever    DM (diabetes mellitus), secondary, uncontrolled, with peripheral vascular complications ([place]) 98/33/8250   MSSA (methicillin susceptible Staphylococcus aureus) infection 03/20/2015   Leukocytosis 03/20/2015   Anemia, iron deficiency 03/20/2015   Cellulitis of left lower extremity    Pyomyositis     Past Surgical History:  Procedure Laterality Date   INGUINAL HERNIA REPAIR Right ~ 2004   SHOULDER ARTHROSCOPY W/ ROTATOR CUFF REPAIR Right ~ 2004   TOENAIL EXCISION Right 1980's X 2   "big toe"       Family History  Problem Relation Age of Onset   Diabetes Mellitus II Mother    Diabetes Mellitus II Father    Diabetes Mellitus II Brother     Social History   Tobacco Use   Smoking status: Current Every Day Smoker    Packs/day: 0.12    Years: 45.00    Pack years: 5.40    Types: Cigarettes   Smokeless tobacco: Never Used  Scientific laboratory technician Use: Never used  Substance Use Topics   Alcohol use: No    Comment: 04/26/2015 "I may drink 1/2 glass of wine or a couple beers 1-2 times/month; if that"   Drug use: No    Comment: "  stopped all drug use in 2005; S/P SARP program in [place]"    Home Medications Prior to Admission medications   Medication Sig Start Date End Date Taking? Authorizing Provider  acetaminophen (TYLENOL) 325 MG tablet Take 2 tablets (650 mg total) by mouth every 6 (six) hours as needed for mild pain (or Fever >/= 101). 03/23/15   Robbie Lis, MD  albuterol (PROVENTIL) (2.5 MG/3ML) 0.083% nebulizer solution Inhale 3 mLs into the lungs every 4 (four) hours as needed for shortness of breath. 11/29/18   [provider]  Amino Acids-Protein Hydrolys (FEEDING SUPPLEMENT, PRO-STAT SUGAR FREE 64,) LIQD Take 30 mLs by mouth 3 (three) times daily with meals.    [provider]  amitriptyline (ELAVIL)  100 MG tablet Take 100 mg by mouth at bedtime. 02/09/19   [provider]  aspirin EC 81 MG tablet Take 81 mg by mouth every morning.    [provider]  atorvastatin (LIPITOR) 80 MG tablet Take 80 mg by mouth daily.    [provider]  carvedilol (COREG) 3.125 MG tablet Take 1 tablet (3.125 mg total) by mouth 2 (two) times daily with a meal. 11/13/15   Thurnell Lose, MD  DULoxetine (CYMBALTA) 20 MG capsule Take 40 mg by mouth 2 (two) times daily.    [provider]  feeding supplement, ENSURE ENLIVE, (ENSURE ENLIVE) LIQD Take 237 mLs by mouth daily. 02/22/19   Ghimire, Henreitta Leber, MD  ferrous sulfate 325 (65 FE) MG tablet Take 325 mg by mouth 3 (three) times daily with meals.    [provider]  insulin aspart (NOVOLOG) 100 UNIT/ML injection Before each meal 3 times a day, 140-199 - 2 units, 200-250 - 4 units, 251-299 - 6 units,  300-349 - 8 units,  350 or above 10 units. Dispense syringes and needles as needed, Ok to switch to PEN if approved. Substitute to any brand approved. DX DM2, Code E11.65 11/13/15   Thurnell Lose, MD  insulin glargine (LANTUS) 100 UNIT/ML injection Inject 0.2 mLs (20 Units total) into the skin daily. 02/22/19   Ghimire, Henreitta Leber, MD  lisinopril (PRINIVIL,ZESTRIL) 2.5 MG tablet Take 1 tablet (2.5 mg total) by mouth daily. 11/13/15   Thurnell Lose, MD  metFORMIN (GLUCOPHAGE) 500 MG tablet Take 500 mg by mouth daily.  10/15/17   [provider]  mirtazapine (REMERON) 15 MG tablet Take 15 mg by mouth at bedtime.    [provider]  Multiple Vitamin (MULTIVITAMIN WITH MINERALS) TABS tablet Take 1 tablet by mouth daily. 03/23/15   Robbie Lis, MD  Multiple Vitamins-Minerals (CENTRUM ADULTS PO) Take 1 tablet by mouth daily.     [provider]  oxyCODONE (ROXICODONE) 5 MG immediate release tablet Take 1 tablet (5 mg total) by mouth every 6 (six) hours as needed for up to 12 doses for breakthrough pain.  05/08/20   Kimela Malstrom, DO  pantoprazole (PROTONIX) 40 MG tablet Take 1 tablet (40 mg total) by mouth 2 (two) times daily. 11/13/15   Thurnell Lose, MD  potassium chloride SA (K-DUR) 20 MEQ tablet Take 1 tablet (20 mEq total) by mouth daily. 02/23/19   Ghimire, Henreitta Leber, MD  tamsulosin (FLOMAX) 0.4 MG CAPS capsule Take 0.4 mg by mouth daily after supper.    [provider]  tiZANidine (ZANAFLEX) 4 MG tablet Take 4 mg by mouth every 8 (eight) hours as needed for muscle spasms.    [provider]  torsemide (  DEMADEX) 20 MG tablet Take 1 tablet (20 mg total) by mouth daily. 02/23/19   Ghimire, Henreitta Leber, MD    Allergies    Other and Sulfa antibiotics  Review of Systems   Review of Systems  Constitutional: Negative for chills and fever.  HENT: Negative for ear pain and sore throat.   Eyes: Negative for pain and visual disturbance.  Respiratory: Negative for cough and shortness of breath.   Cardiovascular: Negative for chest pain and palpitations.  Gastrointestinal: Negative for abdominal pain and vomiting.  Genitourinary: Negative for dysuria and hematuria.  Musculoskeletal: Negative for arthralgias and back pain.  Skin: Positive for wound. Negative for color change and rash.  Neurological: Negative for seizures and syncope.  All other systems reviewed and are negative.   Physical Exam Updated Vital Signs  ED Triage Vitals  Enc Vitals Group     BP 05/08/20 0648 (!) 175/90     Pulse Rate 05/08/20 0648 80     Resp 05/08/20 0648 18     Temp 05/08/20 0648 100 F (37.8 C)     Temp Source 05/08/20 0648 Oral     SpO2 05/08/20 0648 90 %     Weight 05/08/20 0649 254 lb (115.2 kg)     Height 05/08/20 0649 6\' 5"  (1.956 m)     Head Circumference --      Peak Flow --      Pain Score 05/08/20 0650 8     Pain Loc --      Pain Edu? --      Excl. in [place]? --     Physical Exam Vitals and nursing note reviewed.  Constitutional:      General: He is not in acute  distress.    Appearance: He is well-developed. He is not ill-appearing.  HENT:     Head: Normocephalic and atraumatic.     Nose: Nose normal.     Mouth/Throat:     Mouth: Mucous membranes are moist.  Eyes:     Extraocular Movements: Extraocular movements intact.     Conjunctiva/sclera: Conjunctivae normal.     Pupils: Pupils are equal, round, and reactive to light.  Cardiovascular:     Rate and Rhythm: Normal rate and regular rhythm.     Pulses: Normal pulses.     Heart sounds: Normal heart sounds. No murmur Dobrowski.   Pulmonary:     Effort: Pulmonary effort is normal. No respiratory distress.     Breath sounds: Normal breath sounds.  Abdominal:     Palpations: Abdomen is soft.     Tenderness: There is no abdominal tenderness.  Musculoskeletal:     Cervical back: Neck supple.  Skin:    General: Skin is warm and dry.     Comments: Skin breakdown to the left lower extremity in the calf region with chronic appearing wound with good wound margins with no purulent drainage but there is severe denuding of the skin in this area, there is also some skin breakdown on the right lower extremity, there is no leg swelling or pitting edema, no surrounding cellulitis or purulence from either wound  Neurological:     General: No focal deficit present.     Mental Status: He is alert.     ED Results / Procedures / Treatments   Labs (all labs ordered are listed, but only abnormal results are displayed) Labs Reviewed  CBC WITH DIFFERENTIAL/PLATELET - Abnormal; Notable for the following components:      Result Value  Hemoglobin 10.1 (*)    HCT 37.0 (*)    MCV 69.0 (*)    MCH 18.8 (*)    MCHC 27.3 (*)    RDW 20.2 (*)    All other components within normal limits  BASIC METABOLIC PANEL - Abnormal; Notable for the following components:   Glucose, Bld 163 (*)    BUN <5 (*)    Calcium 8.6 (*)    All other components within normal limits  CBG MONITORING, ED - Abnormal; Notable for the following  components:   Glucose-Capillary 192 (*)    All other components within normal limits  CBG MONITORING, ED - Abnormal; Notable for the following components:   Glucose-Capillary 147 (*)    All other components within normal limits    EKG None  Radiology No results found.  Procedures Procedures (including critical care time)  Medications Ordered in ED Medications  fentaNYL (SUBLIMAZE) injection 50 mcg (50 mcg Intravenous Given 05/08/20 1843)    ED Course  I have reviewed the triage vital signs and the nursing notes.  Pertinent labs & imaging results that were available during my care of the patient were reviewed by me and considered in my medical decision making (see chart for details).    MDM Rules/Calculators/A&P                          [name] is a 64 year old male with history of chronic leg wounds, high cholesterol, hypertension who presents to the ED with ongoing lower leg pain.  Having some breakthrough pain intermittently worse this morning but now improved.  Follows closely with wound care.  Chronic leg ulcer on the left lower extremity looks well-appearing with good wound edges.  No purulent drainage.  There is severely denuded skin in this area and that is area where he is tender.  There is no leg swelling.  No concern for arterial or DVT process in the leg.  Patient with also chronic appearing wound to the right lower extremity that is not as painful.  Lab work showed no significant anemia, electrolyte abnormality.  Overall suspect acute on chronic pain.  Will give Roxicodone for breakthrough pain.  Recommend follow-up with primary care doctor.  Understands return precautions.  This chart was dictated using voice recognition software.  Despite best efforts to proofread,  errors can occur which can change the documentation meaning.    Final Clinical Impression(s) / ED Diagnoses Final diagnoses:  Left leg pain  Right leg pain    Rx / DC Orders ED Discharge Orders          Ordered    oxyCODONE (ROXICODONE) 5 MG immediate release tablet  Every 6 hours PRN     Discontinue  Reprint     05/08/20 2058           Lennice Sites, DO 05/08/20 2101

## 2020-05-25 ENCOUNTER — Other Ambulatory Visit: Payer: Self-pay

## 2020-05-25 ENCOUNTER — Encounter (HOSPITAL_BASED_OUTPATIENT_CLINIC_OR_DEPARTMENT_OTHER): Payer: Medicare Other | Attending: Internal Medicine | Admitting: Internal Medicine

## 2020-05-25 DIAGNOSIS — I87333 Chronic venous hypertension (idiopathic) with ulcer and inflammation of bilateral lower extremity: Secondary | ICD-10-CM | POA: Insufficient documentation

## 2020-05-25 DIAGNOSIS — E11622 Type 2 diabetes mellitus with other skin ulcer: Secondary | ICD-10-CM | POA: Insufficient documentation

## 2020-05-25 DIAGNOSIS — E1140 Type 2 diabetes mellitus with diabetic neuropathy, unspecified: Secondary | ICD-10-CM | POA: Insufficient documentation

## 2020-05-25 DIAGNOSIS — I509 Heart failure, unspecified: Secondary | ICD-10-CM | POA: Insufficient documentation

## 2020-05-25 DIAGNOSIS — M1990 Unspecified osteoarthritis, unspecified site: Secondary | ICD-10-CM | POA: Insufficient documentation

## 2020-05-25 DIAGNOSIS — E1151 Type 2 diabetes mellitus with diabetic peripheral angiopathy without gangrene: Secondary | ICD-10-CM | POA: Diagnosis not present

## 2020-05-25 DIAGNOSIS — I110 Hypertensive heart disease with heart failure: Secondary | ICD-10-CM | POA: Insufficient documentation

## 2020-05-25 DIAGNOSIS — I872 Venous insufficiency (chronic) (peripheral): Secondary | ICD-10-CM | POA: Diagnosis not present

## 2020-05-25 DIAGNOSIS — I890 Lymphedema, not elsewhere classified: Secondary | ICD-10-CM | POA: Diagnosis not present

## 2020-05-25 DIAGNOSIS — D649 Anemia, unspecified: Secondary | ICD-10-CM | POA: Diagnosis not present

## 2020-05-25 DIAGNOSIS — G4730 Sleep apnea, unspecified: Secondary | ICD-10-CM | POA: Insufficient documentation

## 2020-05-25 DIAGNOSIS — L97212 Non-pressure chronic ulcer of right calf with fat layer exposed: Secondary | ICD-10-CM | POA: Diagnosis not present

## 2020-05-25 DIAGNOSIS — L97222 Non-pressure chronic ulcer of left calf with fat layer exposed: Secondary | ICD-10-CM | POA: Diagnosis not present

## 2020-05-25 NOTE — Progress Notes (Signed)
Salmi, Juandaniel ([id number]) Visit Report for 05/25/2020 HPI Details [name], [name] 05/25/2020 12:30 PM Medical Record Number: [id number] Patient Account Number: [id number] Date of Birth/Sex: Treating RN: [birth year] (64 y.o. [name] Primary Care Provider: [name] Other Clinician: Referring Provider: Treating Provider/Extender: [name] in Treatment: 12 History of Present Illness HPI Description: 07/07/15; this is a patient who is in hospital on 8/2 through 8/4. He had cellulitis and abscess of predominantly I think the left leg. He received IV antibiotics. Plain x-ray showed no osteomyelitis. An MRI of the left leg did not show osteomyelitis. Cultures showed no predominant organism. His hemoglobin A1c was 9.8. He has a history of venous stasis also peripheral vascular disease. He was discharged to [facility] home. He really has extensive ulcerations on the left lateral leg including a major wound that communicates both posteriorly and superiorly w. When drainage coming out of 2 smaller areas. He has a smaller wound on the posterior medial left leg. He has more predominantly venous insufficiency wounds on predominantly the right medial leg above the ankle the right foot into sections. He has recently been put on Augmentin at the nursing home. Previous ABIs/arterial evaluation showed triphasic waves diffusely he does not have a major ischemic issue a left lower extremity venous duplex also exam showed no evidence of a DVT on 6/21 07/21/15; the patient arrives with really no major change. Culture I did last time was negative. He has not had a follow-up MRI I ordered. The wounds are macerated covered with a thick gelatinous surface slough. There is necrotic subcutaneous tissue 08/04/15; the patient arrives with a considerable improvement in the majority of his wound area. The area on the left leg now has what looks to  be a granulated base. Most of the wounds on the left leg required an aggressive surgical debridement to remove nonviable fibrinous eschar and subcutaneous tissue however after debridement most of this looks better. Although I had asked for repeat MRI of the left leg when he first came in here with grossly purulent material coming out of his wounds, it does not appear that this is been done and nor do I actually feel that strongly about it right now. He has severe surrounding venous insufficiency and inflammation. I don't believe he has significant PAD 08/25/15. In general there is still a considerable wound area here but with still extensive service adherent slough. The patient will not allow mechanical debridement due to pain. He did not tolerate Medihoney therefore we are left with Santyl for now. She would appear that he has severe surrounding venous stasis. There is no evidence of the infection that may have had something to do with the pathogenesis of these wounds 09/29/15; patient still has substantial wound area on the left leg with a cluster of several wounds on the lateral leg confluently on the left leg posteriorly and then a substantial wound on the medial leg. On the right leg a substantial wound medially and a small area on the right lateral foot. All of these underwent a substantial surgical debridement with curettes which she tolerated better than he has in the past. This started as a complex cellulitis in the face of chronic venous insufficiency and inflammation. 10/13/15; substantial cluster of wounds on the lateral aspect of his left leg confluently around to the other side. Extensive surgical debridement to remove for redness surface slough nonviable subcutaneous tissue. This is not an improvement. Also  substantial wound on the medial right leg which is largely unchanged. The etiology of this was felt to be a complex cellulitis in the summer of 2016 in the face of chronic venous  insufficiency and inflammation. The patient states that the wound on the right medial leg has been there for years off and on. 10/20/15; we are able to start [facility] to these extensive wound areas left greater than right on Tuesday after I spoke to the wound care nurse at the facility. He arrives here today for extensive surgical debridement for the wounds on the lateral aspect of his left leg posterior left leg, this is almost circumferential. He has a large wound on the medial aspect of his right leg although he finds this too painful for debridement 10/27/15; I continue to bring this patient back for frequent debridement/weekly debridement severe. We have been using Hydrofera Blue. Unfortunately this has not really had any improvement. The debridement surgery difficult and painful for the patient 11/23/15; this patient spent a complex hospitalization admitted with acute kidney injury, anemia, cellulitis of the lower extremity, he was felt to have sepsis pathophysiology although his blood cultures were negative. He had plain x-rays of both legs that were negative for osseous abnormalities. He was seen by infectious disease and placed on a workup for vasculitis that was negative including a biopsy 4 all were consistent with stasis dermatitis. Vital broad- spectrum antibiotics he continued to spike fevers. Urine and chest x-ray were negative Dopplers on admission ruled out a DVT All antibiotics were stopped on . 2/17. Since his return to [facility] skilled nursing facility I believe they have been using Xeroform 12/07/15 the patient arrives today with the area on his right medial leg actually looking quite stable. No debridement. The rest of his extensive wounds on the left lateral left posterior extending into the left medial leg all required extensive debridement. I think this is probably going to need to and up in the hands of plastic surgery. We'll attempt to change him back to [facility].  Arrange consultation with plastic surgery at [facility] for this almost circumferential wound on the left side 12/21/15 right leg covered in surface slough. This was debridement. Left leg extensive wounds all carefully examined. This is almost circumferential on the left side especially on the posterior calf. No debridement is necessary. 01/04/16 the patient has been to [facility] plastic surgery. Unfortunately it doesn't look like they had any of the prior workup on this patient. They're in the middle of vascular workup. It sounds as though they're applying Hydrofera Blue at the nursing home. 01/22/16; the patient has been back to see [facility]. There apparently making plans to possibly do skin grafts over his large venous insufficiency ulcerations. The patient tells me that he goes to hematology in [facility] and variably uses the term platelets red cells and the description of his problem. 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 I do not see any major difference in the wound on the right medial leg and circumferentially across the left medial to left lateral leg. Did not attempt to debride these today. Readmission: 05/05/18 on evaluation today patient presents for reevaluation here in our clinic although I have previously seen him in the skilled nursing facility over the past year and a half when I was working in the skilled nursing facility realm instead of covering in the clinic. With that being said during that time we had initiated most  recently dressings with Hydrofera Blue Dressing along with the [employer] which had done very well for him. Much better than the Kerlex Coban wraps. With that being said the patient had been doing fairly well and was making progress when I last saw him. Upon evaluation today it appears that the wounds are actually a little bit worse than when I last saw him although definitely not dramatically so. There does not appear  to be any evidence of infection which is good news. With that being said he has been tolerating the wraps without complication his left hip still gives him a lot of trouble which is his main issue as far as movement is concerned. Unbeknownst to me he has not had venous studies that I can find. He previously told me he had there were arterial studies noted back from 04/27/15 which showed that he had try phasic blood flow throughout and again his test appear to be completely normal as performed by Dr. Creig Hines. With that being said I cannot find where he had venous studies. The patient still states he thinks he did these definitely had no venous intervention at this point. Upon evaluation today it does not appear to me that the patient has any evidence of cellulitis. He does have some chronic venous insufficiency which I think has led to stasis dermatitis but again this is not appearing to be infected at this point. No fevers, chills, nausea, or vomiting noted at this time. READMISSION 06/30/2018 This is a now 64 year old man we have had in this clinic at multiple times in the past. Most recently he came in August and was seen by [facility] stone. It is not clear why he did not come back we asked him really did not get a straight answer. He is at [facility] skilled facility he is a man who has severe chronic venous insufficiency with lymphedema and has had severe wounds on his left greater than right leg. We had him here in 2016 and 17 ultimately referred him to [facility]. He had arterial studies and venous studies done at [facility] although I am not able to access these records I see they are actually done. He also had multiple biopsies that were negative for malignancy showing changes compatible with chronic inflammation. As I understand things in [place] they are using Iodoflex and Unna boots. I am not really sure how they are getting enough Iodoflex for the large wound area especially on his left  calf. Nevertheless the wounds look better than when I saw this in 2017. There were plans for him to have plastic surgery in 2018 and I think he was actually prepped for surgery however he was ultimately denied because the patient was an active smoker. The patient is not systemically unwell. The wounds are painful however given the size especially on the left this is not surprising. ABIs in this clinic were 0.78 on the left and 0.91 on the right 07/13/2018; this is a patient with bilateral severe venous ulcers with secondary lymphedema. The wounds are on the right medial calf and a substantial part of the left posterior calf and some involvement medially and laterally on the left. We changed him to silver alginate under Unna boot's last time. The wounds actually look fairly satisfactory today. 08/14/2018; this is a patient with severe bilateral venous stasis ulcers with secondary lymphedema left greater than right he is at [facility] using silver alginate under Unna boot's I do not think there is  much change on this visit versus last time I saw him a month ago Readmission: 11/04/18 patient presents today for follow-up concerning his bilateral lower extremity ulcers. His a right medial ankle ulcer and a left leg that has ulcers over a large proportion of the surface area between the ankle and knee. Unfortunately this does cause some discomfort for the patient although it doesn't seem to be as uncomfortable as it has been in the past. He is seen today for evaluation after a referral by [place] back to the clinic. Unfortunately has a lot of Slough noted on the surface of his wounds. He's been treated currently it sounds like with Dakin's soaked gauze and they have not been performing the Unna Boot wrap that was previously recommended. I'm not exactly sure what the reason for the change was. He does have less slough on the surface of the wound but again this is something that is often a constant issue for  him. Again he's had these wounds for many years. I've known him for two years at least during that time when I was taking care of them in the facility he is Artie had the wounds for several years prior. READMISSION 02/25/2020 Josepha Pigg is a now 64 year old man. He has been in our clinic several times before most extensively in October 2016 through May 2017. At that time he had absolutely substantial bilateral lower extremity wounds secondary to chronic venous insufficiency and lymphedema. We ultimately referred him to [facility] he had a series of biopsies only showed suggestion of wound secondary to chronic venous insufficiency. He had a less extensive stay in our clinic in 2019 and then a single visit in February 2020. He is a resident at [facility] skilled facility. I think he has had intermittently had in facility wound care. He returns today. They are using silver alginate on the wounds although I am not sure what type of compression. Previously he has favored Unna boots. He is not felt to have an arterial issue. Past medical history; lymphedema and chronic venous insufficiency, diabetes mellitus, hypertension, congestive heart failure We did not do arterial studies today 04/27/2020 on evaluation patient appears to be doing really about the same as when I have known him and seen him in the past. He does have wounds on the right and left legs at this point. Fortunately there is no signs of active infection at this time. 9/2; 1 month follow-up. This is a man with severe chronic venous insufficiency and lymphedema. When I first met him he had horrible circumferential bilateral lower extremity ulcers. We have been using silver alginate up until early last month when it was changed to [facility] and Unna boot's more or less says maintenance dressings. Since the last time he was here the facility where he resides [facility] called to report they could no longer do Unna boot so he is apparently only  been receiving kerlix Coban the left leg is certainly a lot worse with almost circumferential large wounds especially lateral but now medial and posterior as well. He has a standard same looking wound on the right medial lower leg. Electronic Signature(s) Signed: 05/25/2020 5:13:24 PM By: Linton Ham MD Entered By: Linton Ham on 05/25/2020 14:11:43 -------------------------------------------------------------------------------- Physical Exam Details [name], [name] 05/25/2020 12:30 PM Medical Record Number: [id number] Patient Account Number: [id number] Date of Birth/Sex: Treating RN: 05/26/1956 (64 y.o. [name] Primary Care Provider: [name] Other Clinician: Referring Provider: Treating  Provider/Extender: Bayard Hugger Weeks in Treatment: 12 Cardiovascular Pedal pulses are palpable. Changes of severe chronic venous insufficiency. Integumentary (Hair, Skin) Marked hemosiderin deposition bilaterally. Notes Wound exam; I continued the Hydrofera Blue but I really want something more than Curlex and Coban we put him in 4 layer compression. Electronic Signature(s) Signed: 05/25/2020 5:13:24 PM By: Linton Ham MD Entered By: Linton Ham on 05/25/2020 14:13:00 -------------------------------------------------------------------------------- Physician Orders Details [name], [name] 05/25/2020 12:30 PM Medical Record Number: [id number] Patient Account Number: [id number] Date of Birth/Sex: Treating RN: 10-08-1955 (64 y.o. [name] Primary Care Provider: [name] Other Clinician: Referring Provider: Treating Provider/Extender: [name] in Treatment: 12 Verbal / Phone Orders: No Diagnosis Coding ICD-10 Coding Code Description [phone] Chronic venous hypertension (idiopathic) with ulcer and inflammation of bilateral lower extremity I89.0  Lymphedema, not elsewhere classified L97.222 Non-pressure chronic ulcer of left calf with fat layer exposed L97.212 Non-pressure chronic ulcer of right calf with fat layer exposed Follow-up Appointments Return appointment in 1 month. Dressing Change Frequency Wound #21 Left,Distal Lower Leg Change dressing three times week. Wound #22 Right,Medial Lower Leg Change dressing three times week. Skin Barriers/Peri-Wound Care Barrier cream Moisturizing lotion Wound Cleansing Clean wound with Wound Cleanser - or normal saline Primary Wound Dressing Wound #21 Left,Distal Lower Leg Hydrofera Blue - Classic Wound #22 Right,Medial Lower Leg Hydrofera Blue - Classic Secondary Dressing Wound #21 Left,Distal Lower Leg ABD pad Zetuvit or Kerramax - or equivalent super absorbent pad Wound #22 Right,Medial Lower Leg ABD pad Zetuvit or Kerramax - or equivalent super absorbent pad Edema Control 4 layer compression - Bilateral Avoid standing for long periods of time Elevate legs to the level of the heart or above for 30 minutes daily and/or when sitting, a frequency of: - throughout the day Electronic Signature(s) Signed: 05/25/2020 5:08:15 PM By: Levan Hurst RN, BSN Signed: 05/25/2020 5:13:24 PM By: Linton Ham MD Entered By: Levan Hurst on 05/25/2020 13:49:17 -------------------------------------------------------------------------------- Problem List Details [name], [name] 05/25/2020 12:30 PM Medical Record Number: [id number] Patient Account Number: [id number] Date of Birth/Sex: Treating RN: [birth year] (64 y.o. [name] Primary Care Provider: [name] Other Clinician: Referring Provider: Treating Provider/Extender: [name] in Treatment: 12 Active Problems ICD-10 Encounter Code Description Active Date MDM Diagnosis I87.333 Chronic venous hypertension (idiopathic) with ulcer and inflammation of 02/25/2020  No Yes bilateral lower extremity I89.0 Lymphedema, not elsewhere classified 02/25/2020 No Yes L97.222 Non-pressure chronic ulcer of left calf with fat layer exposed 02/25/2020 No Yes L97.212 Non-pressure chronic ulcer of right calf with fat layer exposed 02/25/2020 No Yes Inactive Problems Resolved Problems Electronic Signature(s) Signed: 05/25/2020 5:13:24 PM By: Linton Ham MD Entered By: Linton Ham on 05/25/2020 14:08:37 -------------------------------------------------------------------------------- Progress Note Details [name], [name] 05/25/2020 12:30 PM Medical Record Number: [id number] Patient Account Number: [id number] Date of Birth/Sex: Treating RN: 03-Apr-1956 (64 y.o. [name] Primary Care Provider: [name] Other Clinician: Referring Provider: Treating Provider/Extender: [name] in Treatment: 12 Subjective History of Present Illness (HPI) 07/07/15; this is a patient who is in hospital on 8/2 through 8/4. He had cellulitis and abscess of predominantly I think the left leg. He received IV antibiotics. Plain x-ray showed no osteomyelitis. An MRI of the left leg did not show osteomyelitis. Cultures showed no predominant organism. His hemoglobin A1c was 9.8. He has a history of venous  stasis also peripheral vascular disease. He was discharged to [place] home. He really has extensive ulcerations on the left lateral leg including a major wound that communicates both posteriorly and superiorly w. When drainage coming out of 2 smaller areas. He has a smaller wound on the posterior medial left leg. He has more predominantly venous insufficiency wounds on predominantly the right medial leg above the ankle the right foot into sections. He has recently been put on Augmentin at the nursing home. Previous ABIs/arterial evaluation showed triphasic waves diffusely he does not have a major ischemic issue a  left lower extremity venous duplex also exam showed no evidence of a DVT on 6/21 07/21/15; the patient arrives with really no major change. Culture I did last time was negative. He has not had a follow-up MRI I ordered. The wounds are macerated covered with a thick gelatinous surface slough. There is necrotic subcutaneous tissue 08/04/15; the patient arrives with a considerable improvement in the majority of his wound area. The area on the left leg now has what looks to be a granulated base. Most of the wounds on the left leg required an aggressive surgical debridement to remove nonviable fibrinous eschar and subcutaneous tissue however after debridement most of this looks better. Although I had asked for repeat MRI of the left leg when he first came in here with grossly purulent material coming out of his wounds, it does not appear that this is been done and nor do I actually feel that strongly about it right now. He has severe surrounding venous insufficiency and inflammation. I don't believe he has significant PAD 08/25/15. In general there is still a considerable wound area here but with still extensive service adherent slough. The patient will not allow mechanical debridement due to pain. He did not tolerate Medihoney therefore we are left with Santyl for now. She would appear that he has severe surrounding venous stasis. There is no evidence of the infection that may have had something to do with the pathogenesis of these wounds 09/29/15; patient still has substantial wound area on the left leg with a cluster of several wounds on the lateral leg confluently on the left leg posteriorly and then a substantial wound on the medial leg. On the right leg a substantial wound medially and a small area on the right lateral foot. All of these underwent a substantial surgical debridement with curettes which she tolerated better than he has in the past. This started as a complex cellulitis in the face of  chronic venous insufficiency and inflammation. 10/13/15; substantial cluster of wounds on the lateral aspect of his left leg confluently around to the other side. Extensive surgical debridement to remove for redness surface slough nonviable subcutaneous tissue. This is not an improvement. Also substantial wound on the medial right leg which is largely unchanged. The etiology of this was felt to be a complex cellulitis in the summer of 2016 in the face of chronic venous insufficiency and inflammation. The patient states that the wound on the right medial leg has been there for years off and on. 10/20/15; we are able to start [facility] to these extensive wound areas left greater than right on Tuesday after I spoke to the wound care nurse at the facility. He arrives here today for extensive surgical debridement for the wounds on the lateral aspect of his left leg posterior left leg, this is almost circumferential. He has a large wound on the medial aspect of his right leg  although he finds this too painful for debridement 10/27/15; I continue to bring this patient back for frequent debridement/weekly debridement severe. We have been using Hydrofera Blue. Unfortunately this has not really had any improvement. The debridement surgery difficult and painful for the patient 11/23/15; this patient spent a complex hospitalization admitted with acute kidney injury, anemia, cellulitis of the lower extremity, he was felt to have sepsis pathophysiology although his blood cultures were negative. He had plain x-rays of both legs that were negative for osseous abnormalities. He was seen by infectious disease and placed on a workup for vasculitis that was negative including a biopsy o4 all were consistent with stasis dermatitis. Vital broad-spectrum antibiotics he continued to spike fevers. Urine and chest x-ray were negative Dopplers on admission ruled out a DVT All antibiotics were stopped on 2/17. . Since his  return to [facility] skilled nursing facility I believe they have been using Xeroform 12/07/15 the patient arrives today with the area on his right medial leg actually looking quite stable. No debridement. The rest of his extensive wounds on the left lateral left posterior extending into the left medial leg all required extensive debridement. I think this is probably going to need to and up in the hands of plastic surgery. We'll attempt to change him back to [facility]. Arrange consultation with plastic surgery at [facility] for this almost circumferential wound on the left side 12/21/15 right leg covered in surface slough. This was debridement. Left leg extensive wounds all carefully examined. This is almost circumferential on the left side especially on the posterior calf. No debridement is necessary. 01/04/16 the patient has been to [facility] plastic surgery. Unfortunately it doesn't look like they had any of the prior workup on this patient. They're in the middle of vascular workup. It sounds as though they're applying Hydrofera Blue at the nursing home. 01/22/16; the patient has been back to see [facility]. There apparently making plans to possibly do skin grafts over his large venous insufficiency ulcerations. The patient tells me that he goes to hematology in [facility] and variably uses the term platelets red cells and the description of his problem. 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 I do not see any major difference in the wound on the right medial leg and circumferentially across the left medial to left lateral leg. Did not attempt to debride these today. Readmission: 05/05/18 on evaluation today patient presents for reevaluation here in our clinic although I have previously seen him in the skilled nursing facility over the past year and a half when I was working in the skilled nursing facility realm instead of covering in the clinic. With that  being said during that time we had initiated most recently dressings with Hydrofera Blue Dressing along with the [employer] which had done very well for him. Much better than the Kerlex Coban wraps. With that being said the patient had been doing fairly well and was making progress when I last saw him. Upon evaluation today it appears that the wounds are actually a little bit worse than when I last saw him although definitely not dramatically so. There does not appear to be any evidence of infection which is good news. With that being said he has been tolerating the wraps without complication his left hip still gives him a lot of trouble which is his main issue as far as movement is concerned. Unbeknownst to me he has not had  venous studies that I can find. He previously told me he had there were arterial studies noted back from 04/27/15 which showed that he had try phasic blood flow throughout and again his test appear to be completely normal as performed by Dr. Creig Hines. With that being said I cannot find where he had venous studies. The patient still states he thinks he did these definitely had no venous intervention at this point. Upon evaluation today it does not appear to me that the patient has any evidence of cellulitis. He does have some chronic venous insufficiency which I think has led to stasis dermatitis but again this is not appearing to be infected at this point. No fevers, chills, nausea, or vomiting noted at this time. READMISSION 06/30/2018 This is a now 64 year old man we have had in this clinic at multiple times in the past. Most recently he came in August and was seen by [facility] stone. It is not clear why he did not come back we asked him really did not get a straight answer. He is at [facility] skilled facility he is a man who has severe chronic venous insufficiency with lymphedema and has had severe wounds on his left greater than right leg. We had him here in 2016 and 17  ultimately referred him to [facility]. He had arterial studies and venous studies done at [facility] although I am not able to access these records I see they are actually done. He also had multiple biopsies that were negative for malignancy showing changes compatible with chronic inflammation. As I understand things in [place] they are using Iodoflex and Unna boots. I am not really sure how they are getting enough Iodoflex for the large wound area especially on his left calf. Nevertheless the wounds look better than when I saw this in 2017. There were plans for him to have plastic surgery in 2018 and I think he was actually prepped for surgery however he was ultimately denied because the patient was an active smoker. The patient is not systemically unwell. The wounds are painful however given the size especially on the left this is not surprising. ABIs in this clinic were 0.78 on the left and 0.91 on the right 07/13/2018; this is a patient with bilateral severe venous ulcers with secondary lymphedema. The wounds are on the right medial calf and a substantial part of the left posterior calf and some involvement medially and laterally on the left. We changed him to silver alginate under Unna boot's last time. The wounds actually look fairly satisfactory today. 08/14/2018; this is a patient with severe bilateral venous stasis ulcers with secondary lymphedema left greater than right he is at [facility] using silver alginate under Unna boot's I do not think there is much change on this visit versus last time I saw him a month ago Readmission: 11/04/18 patient presents today for follow-up concerning his bilateral lower extremity ulcers. His a right medial ankle ulcer and a left leg that has ulcers over a large proportion of the surface area between the ankle and knee. Unfortunately this does cause some discomfort for the patient although it doesn't seem to be as uncomfortable as it has been in the  past. He is seen today for evaluation after a referral by [place] back to the clinic. Unfortunately has a lot of Slough noted on the surface of his wounds. He's been treated currently it sounds like with Dakin's soaked gauze and they have not been performing the [employer]  wrap that was previously recommended. I'm not exactly sure what the reason for the change was. He does have less slough on the surface of the wound but again this is something that is often a constant issue for him. Again he's had these wounds for many years. I've known him for two years at least during that time when I was taking care of them in the facility he is Artie had the wounds for several years prior. READMISSION 02/25/2020 Josepha Pigg is a now 64 year old man. He has been in our clinic several times before most extensively in October 2016 through May 2017. At that time he had absolutely substantial bilateral lower extremity wounds secondary to chronic venous insufficiency and lymphedema. We ultimately referred him to [facility] he had a series of biopsies only showed suggestion of wound secondary to chronic venous insufficiency. He had a less extensive stay in our clinic in 2019 and then a single visit in February 2020. He is a resident at [facility] skilled facility. I think he has had intermittently had in facility wound care. He returns today. They are using silver alginate on the wounds although I am not sure what type of compression. Previously he has favored Unna boots. He is not felt to have an arterial issue. Past medical history; lymphedema and chronic venous insufficiency, diabetes mellitus, hypertension, congestive heart failure We did not do arterial studies today 04/27/2020 on evaluation patient appears to be doing really about the same as when I have known him and seen him in the past. He does have wounds on the right and left legs at this point. Fortunately there is no signs of active infection at this time. 9/2;  1 month follow-up. This is a man with severe chronic venous insufficiency and lymphedema. When I first met him he had horrible circumferential bilateral lower extremity ulcers. We have been using silver alginate up until early last month when it was changed to [facility] and Unna boot's more or less says maintenance dressings. Since the last time he was here the facility where he resides [facility] called to report they could no longer do Unna boot so he is apparently only been receiving kerlix Coban the left leg is certainly a lot worse with almost circumferential large wounds especially lateral but now medial and posterior as well. He has a standard same looking wound on the right medial lower leg. Objective Constitutional Vitals Time Taken: 1:13 PM, Height: 77 in, Weight: 254 lbs, BMI: 30.1, Temperature: 98.7 F, Pulse: 77 bpm, Respiratory Rate: 18 breaths/min, Blood Pressure: 137/89 mmHg, Capillary Blood Glucose: 241 mg/dl. Cardiovascular Pedal pulses are palpable. Changes of severe chronic venous insufficiency. General Notes: Wound exam; I continued the Hydrofera Blue but I really want something more than Curlex and Coban we put him in 4 layer compression. Integumentary (Hair, Skin) Marked hemosiderin deposition bilaterally. Wound #20 status is Converted. Original cause of wound was Gradually Appeared. The wound is located on the Left,Lateral Lower Leg. The wound measures 0cm length x 0cm width x 0cm depth; 0cm^2 area and 0cm^3 volume. Wound #21 status is Open. Original cause of wound was Gradually Appeared. The wound is located on the Left,Distal Lower Leg. The wound measures 17.5cm length x 15.5cm width x 0.3cm depth; 213.039cm^2 area and 63.912cm^3 volume. There is Fat Layer (Subcutaneous Tissue) exposed. There is no tunneling or undermining noted. There is a large amount of serosanguineous drainage noted. The wound margin is distinct with the outline attached to the wound base.  There is medium (34-66%) red granulation within the wound bed. There is a medium (34-66%) amount of necrotic tissue within the wound bed including Adherent Slough. Wound #22 status is Open. Original cause of wound was Gradually Appeared. The wound is located on the Right,Medial Lower Leg. The wound measures 5.6cm length x 4.8cm width x 0.1cm depth; 21.112cm^2 area and 2.111cm^3 volume. There is Fat Layer (Subcutaneous Tissue) exposed. There is no tunneling or undermining noted. There is a medium amount of serosanguineous drainage noted. The wound margin is flat and intact. There is medium (34-66%) red, pink granulation within the wound bed. There is a medium (34-66%) amount of necrotic tissue within the wound bed including Adherent Slough. Assessment Active Problems ICD-10 Chronic venous hypertension (idiopathic) with ulcer and inflammation of bilateral lower extremity Lymphedema, not elsewhere classified Non-pressure chronic ulcer of left calf with fat layer exposed Non-pressure chronic ulcer of right calf with fat layer exposed Procedures Wound #21 Pre-procedure diagnosis of Wound #21 is a Venous Leg Ulcer located on the Left,Distal Lower Leg . There was a Four Layer Compression Therapy Procedure by Levan Hurst, RN. Post procedure Diagnosis Wound #21: Same as Pre-Procedure Wound #22 Pre-procedure diagnosis of Wound #22 is a Venous Leg Ulcer located on the Right,Medial Lower Leg . There was a Four Layer Compression Therapy Procedure by Levan Hurst, RN. Post procedure Diagnosis Wound #22: Same as Pre-Procedure Plan Follow-up Appointments: Return appointment in 1 month. Dressing Change Frequency: Wound #21 Left,Distal Lower Leg: Change dressing three times week. Wound #22 Right,Medial Lower Leg: Change dressing three times week. Skin Barriers/Peri-Wound Care: Barrier cream Moisturizing lotion Wound Cleansing: Clean wound with Wound Cleanser - or normal saline Primary Wound  Dressing: Wound #21 Left,Distal Lower Leg: Hydrofera Blue - Classic Wound #22 Right,Medial Lower Leg: Hydrofera Blue - Classic Secondary Dressing: Wound #21 Left,Distal Lower Leg: ABD pad Zetuvit or Kerramax - or equivalent super absorbent pad Wound #22 Right,Medial Lower Leg: ABD pad Zetuvit or Kerramax - or equivalent super absorbent pad Edema Control: 4 layer compression - Bilateral Avoid standing for long periods of time Elevate legs to the level of the heart or above for 30 minutes daily and/or when sitting, a frequency of: - throughout the day 1. I really want this patient in 4 layer compression. Certainly kerlix and Coban is not adequate. 2. Still continue with Hydrofera Blue 3. Deterioration in the left leg noted. No evidence of active infection however I think this is from reduction of compression Electronic Signature(s) Signed: 05/25/2020 5:13:24 PM By: Linton Ham MD Entered By: Linton Ham on 05/25/2020 14:14:04 -------------------------------------------------------------------------------- SuperBill Details [name], [name] 05/25/2020 Medical Record Number: [id number] Patient Account Number: [id number] Date of Birth/Sex: Treating RN: April 17, 1956 (64 y.o. [name] Primary Care Provider: [name] Other Clinician: Referring Provider: Treating Provider/Extender: [name] in Treatment: 12 Diagnosis Coding ICD-10 Codes Code Description [phone] Chronic venous hypertension (idiopathic) with ulcer and inflammation of bilateral lower extremity I89.0 Lymphedema, not elsewhere classified L97.222 Non-pressure chronic ulcer of left calf with fat layer exposed L97.212 Non-pressure chronic ulcer of right calf with fat layer exposed Facility Procedures CPT4: Code [id number] foot Description: 1 BILATERAL: Application of multi-layer venous compression system; leg (below knee), including ankle and  . Modifier: Quantity: 1 Physician Procedures : CPT4 Code Description Modifier [id number] - WC PHYS LEVEL 3 - EST PT ICD-10 Diagnosis Description L97.222 Non-pressure chronic ulcer of left calf with fat layer exposed L97.212 Non-pressure  chronic ulcer of right calf with fat layer exposed I87.333  Chronic venous hypertension (idiopathic) with ulcer and inflammation of bilateral lower extremity Quantity: 1 Electronic Signature(s) Signed: 05/25/2020 5:13:24 PM By: Linton Ham MD Entered By: Linton Ham on 05/25/2020 14:14:25

## 2020-05-26 NOTE — Progress Notes (Signed)
[name] ([id number]) Visit Report for 05/25/2020 Arrival Information Details [name], [name] 05/25/2020 [name]:30 PM Medical Record Number: [id number] Patient Account Number: [id number] Date of Birth/Sex: Treating RN: Jun 06, 1956 (64 y.o. [name] Primary Care [name]: [name] Other Clinician: Referring [name]: [name]: [name] Visit Information History Since Last Visit Added or deleted any medications: No Patient Arrived: Wheel Chair Any new allergies or adverse reactions: No Arrival Time: 13:[name] Had a fall or experienced change [name] No Accompanied By: self activities of daily living that may affect Transfer Assistance: Manual risk of falls: Patient Identification Verified: Yes Signs or symptoms of abuse/neglect since last visito No Secondary Verification Process Completed: Yes Hospitalized since last visit: No Patient Requires Transmission-Based Precautions: No Implantable device outside of the clinic excluding No Patient Has Alerts: No cellular tissue based products placed [name] the center since last visit: Has Dressing [name] Place as Prescribed: Yes Pain Present Now: Yes Electronic Signature(s) Signed: 05/26/2020 9:18:57 AM By: Sandre Kitty Entered By: Sandre Kitty on 05/25/2020 13:13:16 -------------------------------------------------------------------------------- Compression Therapy Details [name], [name] 05/25/2020 [name]:30 PM Medical Record Number: [id number] Patient Account Number: [id number] Date of Birth/Sex: Treating RN: 1956-03-24 (64 y.o. [name] Primary Care Channelle Bottger: [name] Other Clinician: Referring [name]: [name]: [name]: [name] Compression Therapy Performed for Wound Assessment: Wound #21 Left,Distal Lower Leg Performed By: Clinician  [name] Hurst, RN Compression Type: Four Layer Post Procedure Diagnosis Same as Pre-procedure Electronic Signature(s) Signed: 05/25/2020 5:08:15 PM By: [name] Hurst RN, BSN Entered By: [name] on 05/25/2020 13:51:04 -------------------------------------------------------------------------------- Compression Therapy Details [name], [name] 05/25/2020 [name]:30 PM Medical Record Number: [id number] Patient Account Number: [id number] Date of Birth/Sex: Treating RN: 08-05-1956 (64 y.o. [name] Primary Care Fritz Cauthon: [name] Other Clinician: Referring Mylen Mangan: [name] Niaja Stickley/Extender: [name]: [name] Compression Therapy Performed for Wound Assessment: Wound #22 Right,Medial Lower Leg Performed By: Clinician [name] Hurst, RN Compression Type: Four Layer Post Procedure Diagnosis Same as Pre-procedure Electronic Signature(s) Signed: 05/25/2020 5:08:15 PM By: [name] Hurst RN, BSN Entered By: [name] on 05/25/2020 13:51:04 -------------------------------------------------------------------------------- Encounter Discharge Information Details [name], [name] 05/25/2020 [name]:30 PM Medical Record Number: [id number] Patient Account Number: [id number] Date of Birth/Sex: Treating RN: 1956-03-18 (64 y.o. [name] Primary Care Aronda Burford: [name] Other Clinician: Referring [name]: [name]: [name]: [name] Encounter Discharge Information Items Discharge Condition: Stable Ambulatory Status: Walker Discharge Destination: [place] Telephoned: No Orders Sent: Yes Transportation: Other Accompanied By: self Schedule Follow-up Appointment: Yes Clinical Summary of Care: Patient Declined Notes facility transportation Electronic Signature(s) Signed: 05/26/2020 2:45:56 PM By:  [name] Gouty RN, BSN Entered By: [name] on 05/25/2020 14:30:00 -------------------------------------------------------------------------------- Multi Wound Chart Details [name], [name] 05/25/2020 [name]:30 PM Medical Record Number: [id number] Patient Account Number: [id number] Date of Birth/Sex: Treating RN: 07-20-1956 (64 y.o. [name] Primary Care [name] Other Clinician: Referring [name]: [name]/Extender: [name]: [name] Vital Signs Height([name]): 77 Capillary Blood Glucose(mg/dl): 241 Weight(lbs): 254 Pulse(bpm): 64 Body Mass Index(BMI): 30 Blood Pressure(mmHg): 137/89 Temperature(F): 98.7 Respiratory Rate(breaths/min): 18 Photos: [20:No Photos Left, Lateral Lower Leg] [21:No Photos Left, Distal Lower Leg] [22:No Photos Right, Medial Lower Leg] Wound Location: [20:Gradually  Appeared] [21:Gradually Appeared] [22:Gradually Appeared] Wounding Event: [20:Venous Leg Ulcer] [21:Venous Leg Ulcer] [22:Venous Leg Ulcer] Primary Etiology: [20:N/A] [21:Anemia, Sleep Apnea, Congestive] [22:Anemia, Sleep Apnea, Congestive] Comorbid History: [20:11/22/2019] [21:Heart Failure, Hypertension, Peripheral Heart Failure, Hypertension, Peripheral Venous Disease, Type II Diabetes, Osteoarthritis, Neuropathy 11/22/2019] [22:Venous Disease, Type II Diabetes, Osteoarthritis, Neuropathy  12/23/2019] Date Acquired: [20:[name]] [21:[name]] [22:[name]] Weeks of [name]: [20:Converted] [21:Open] [22:Open] Wound Status: [20:0x0x0] [21:17.5x15.5x0.3] [22:5.6x4.8x0.1] Measurements L x W x D (cm) [20:0] [89:381.017] [22:21.112] A (cm) : rea [20:0] [51:02.585] [22:2.111] Volume (cm) : [20:100.00%] [21:-35.60%] [22:6.70%] % Reduction [name] Area: [20:100.00%] [21:32.20%] [22:53.30%] % Reduction [name] Volume: [20:Full Thickness Without Exposed] [21:Full Thickness Without Exposed] [22:Full Thickness Without  Exposed] Classification: [20:Support Structures N/A] [21:Support Structures Large] [22:Support Structures Medium] Exudate A mount: [20:N/A] [21:Serosanguineous] [22:Serosanguineous] Exudate Type: [20:N/A] [21:red, brown] [22:red, brown] Exudate Color: [20:N/A] [21:Distinct, outline attached] [22:Flat and Intact] Wound Margin: [20:N/A] [21:Medium (34-66%)] [22:Medium (34-66%)] Granulation A mount: [20:N/A] [21:Red] [22:Red, Pink] Granulation Quality: [20:N/A] [21:Medium (34-66%)] [22:Medium (34-66%)] Necrotic A mount: [20:N/A] [21:Small (1-33%)] [22:Small (1-33%)] Epithelialization: [20:N/A] [21:Compression Therapy] [22:Compression Therapy] [name] Notes Electronic Signature(s) Signed: 05/25/2020 5:08:15 PM By: [name] Hurst RN, BSN Signed: 05/25/2020 5:13:24 PM By: Linton Ham MD Entered By: Linton Ham on 05/25/2020 14:08:49 -------------------------------------------------------------------------------- Multi-Disciplinary Care Plan Details [name], [name] 05/25/2020 [name]:30 PM Medical Record Number: [id number] Patient Account Number: [id number] Date of Birth/Sex: Treating RN: May 05, 1956 (64 y.o. [name] Primary Care Thanvi Blincoe: [name] Other Clinician: Referring Trinton Prewitt: [name] Shaolin Armas/Extender: [name]: [name] Active Inactive Wound/Skin Impairment Nursing Diagnoses: Impaired tissue integrity Goals: Patient/caregiver will verbalize understanding of skin care regimen Date Initiated: 04/27/2020 Target Resolution Date: 06/23/2020 Goal Status: Active Ulcer/skin breakdown will have a volume reduction of 50% by week 8 Date Initiated: 02/25/2020 Date Inactivated: 04/27/2020 Target Resolution Date: 04/14/2020 Goal Status: Met Interventions: Provide education on ulcer and skin care Notes: Electronic Signature(s) Signed: 05/25/2020 5:08:15 PM By: [name] Hurst RN, BSN Entered By: [name]  on 05/25/2020 13:49:42 -------------------------------------------------------------------------------- Pain Assessment Details [name], [name] 05/25/2020 [name]:30 PM Medical Record Number: [id number] Patient Account Number: [id number] Date of Birth/Sex: Treating RN: 1956-04-18 (64 y.o. [name] Primary Care Rexanne Inocencio: [name] Other Clinician: Referring Finneus Kaneshiro: [name] Julissa Browning/Extender: [name]: [name] Active Problems Location of Pain Severity and Description of Pain Patient Has Paino Yes Site Locations Rate the pain. Current Pain Level: 7 Pain Management and Medication Current Pain Management: Electronic Signature(s) Signed: 05/25/2020 5:08:15 PM By: [name] Hurst RN, BSN Signed: 05/26/2020 9:18:57 AM By: Sandre Kitty Entered By: Sandre Kitty on 05/25/2020 13:14:07 -------------------------------------------------------------------------------- Patient/Caregiver Education Details Patient Name: Date of Service: Lorel Monaco 9/2/2021andnbsp12:30 PM Medical Record Number: [id number] Patient Account Number: [id number] Date of Birth/Gender: Treating RN: 04/19/1956 (64 y.o. [name] Primary Care Physician: [name] Other Clinician: Referring Physician: Treating Physician/Extender: [name]: [name] Education Assessment Education Provided To: Patient Education Topics Provided Wound/Skin Impairment: Methods: Explain/Verbal Responses: State content correctly [employer]) Signed: 05/25/2020 5:08:15 PM By: [name] Hurst RN, BSN Entered By: [name] on 05/25/2020 13:50:13 -------------------------------------------------------------------------------- Wound Assessment Details [name], [name] 05/25/2020 [name]:30 PM Medical Record Number: [id number] Patient Account Number: [id number] Date  of Birth/Sex: Treating RN: [name]-06-1956 (64 y.o. [name] Primary Care Keishawn Darsey: [name] Other Clinician: Referring Raychell Holcomb: [name] Maddyson Keil/Extender: [name]:  [name] Wound Status Wound Number: 20 Primary Etiology: Venous Leg Ulcer Wound Location: Left, Lateral Lower Leg Wound Status: Converted Wounding Event: Gradually Appeared Date Acquired: 11/22/2019 Weeks Of [name]: [name] Clustered Wound: No Wound Measurements Length: (cm) Width: (cm) Depth: (cm) Area: (cm) Volume: (cm) 0 % Reduction [name] Area: 100% 0 % Reduction [name] Volume: 100% 0 0 0 Wound Description Classification: Full Thickness Without Exposed Support Structur es Electronic Signature(s) Signed: 05/25/2020 5:08:15 PM By: [name] Hurst RN, BSN Signed: 05/26/2020 9:18:57 AM By: Sandre Kitty Entered By: Sandre Kitty on 05/25/2020 13:36:10 -------------------------------------------------------------------------------- Wound Assessment Details [name], [name] 05/25/2020 [name]:30 PM Medical Record Number: [id number] Patient Account Number: [id number] Date of Birth/Sex: Treating RN: 02-Jun-1956 (64 y.o. [name] Primary Care Oreatha Fabry: [name] Other Clinician: Referring Laray Rivkin: [name] Maverik Foot/Extender: Bayard Hugger Weeks [name] Wound Status Wound Number: 21 Primary Venous Leg Ulcer Etiology: Wound Location: Left, Distal Lower Leg Wound Open Wounding Event: Gradually Appeared Status: Date Acquired: 11/22/2019 Comorbid Anemia, Sleep Apnea, Congestive Heart Failure, Hypertension, Weeks Of [name]: [name] History: Peripheral Venous Disease, Type II Diabetes, Osteoarthritis, Clustered Wound: No Neuropathy Wound Measurements Length: (cm) 17.5 Width: (cm) 15.5 Depth: (cm) 0.3 Area: (cm) 213.039 Volume: (cm) 63.912 % Reduction [name] Area: -35.6% % Reduction [name] Volume:  32.2% Epithelialization: Small (1-33%) Tunneling: No Undermining: No Wound Description Classification: Full Thickness Without Exposed Support Structures Wound Margin: Distinct, outline attached Exudate Amount: Large Exudate Type: Serosanguineous Exudate Color: red, brown Foul Odor After Cleansing: No Slough/Fibrino Yes Wound Bed Granulation Amount: Medium (34-66%) Exposed Structure Granulation Quality: Red Fascia Exposed: No Necrotic Amount: Medium (34-66%) Fat Layer (Subcutaneous Tissue) Exposed: Yes Necrotic Quality: Adherent Slough Tendon Exposed: No Muscle Exposed: No Joint Exposed: No Bone Exposed: No [name] Notes Wound #21 (Left, Distal Lower Leg) 2. Periwound Care Barrier cream Moisturizing lotion 3. Primary Dressing Applied Hydrofera Blue 4. Secondary Dressing ABD Pad Other secondary dressing (specify [name] notes) 6. Support Layer Applied 4 layer compression wrap Notes zetuvit Electronic Signature(s) Signed: 05/25/2020 5:08:15 PM By: [name] Hurst RN, BSN Entered By: [name] on 05/25/2020 13:38:04 -------------------------------------------------------------------------------- Wound Assessment Details [name], [name] 05/25/2020 [name]:30 PM Medical Record Number: [id number] Patient Account Number: [id number] Date of Birth/Sex: Treating RN: 1956-01-11 (64 y.o. [name] Primary Care Kaimana Lurz: [name] Other Clinician: Referring Kayvion Arneson: [name] Olegario Emberson/Extender: Bayard Hugger Weeks [name] Wound Status Wound Number: 22 Primary Venous Leg Ulcer Etiology: Etiology: Wound Location: Right, Medial Lower Leg Wound Open Wounding Event: Gradually Appeared Status: Date Acquired: 12/23/2019 Comorbid Anemia, Sleep Apnea, Congestive Heart Failure, Hypertension, Weeks Of [name]: [name] History: Peripheral Venous Disease, Type II Diabetes, Osteoarthritis, Clustered Wound: No  Neuropathy Wound Measurements Length: (cm) 5.6 Width: (cm) 4.8 Depth: (cm) 0.1 Area: (cm) 21.112 Volume: (cm) 2.111 % Reduction [name] Area: 6.7% % Reduction [name] Volume: 53.3% Epithelialization: Small (1-33%) Tunneling: No Undermining: No Wound Description Classification: Full Thickness Without Exposed Support Structures Wound Margin: Flat and Intact Exudate Amount: Medium Exudate Type: Serosanguineous Exudate Color: red, brown Foul Odor After Cleansing: No Slough/Fibrino Yes Wound Bed Granulation Amount: Medium (34-66%) Exposed Structure Granulation Quality: Red, Pink Fascia Exposed: No Necrotic Amount: Medium (34-66%) Fat Layer (Subcutaneous Tissue) Exposed: Yes Necrotic Quality: Adherent Slough Tendon Exposed: No Muscle Exposed: No Joint Exposed: No Bone Exposed: No [name] Notes Wound #22 (Right, Medial Lower Leg) 2. Periwound Care Barrier cream Moisturizing lotion 3. Primary Dressing Applied Hydrofera Blue 4.  Secondary Dressing ABD Pad Other secondary dressing (specify [name] notes) 6. Support Layer Applied 4 layer compression wrap Notes zetuvit Electronic Signature(s) Signed: 05/25/2020 5:08:15 PM By: [name] Hurst RN, BSN Entered By: [name] on 05/25/2020 13:44:43 -------------------------------------------------------------------------------- Vitals Details [name], [name] 05/25/2020 [name]:30 PM Medical Record Number: [id number] Patient Account Number: [id number] Date of Birth/Sex: Treating RN: 1956-04-05 (64 y.o. [name] Primary Care Zaryan Yakubov: [name] Other Clinician: Referring Nickcole Bralley: [name] Lea Baine/Extender: [name]: [name] Vital Signs Time Taken: 13:13 Temperature (F): 98.7 Height ([name]): 77 Pulse (bpm): 77 Weight (lbs): 254 Respiratory Rate (breaths/min): 18 Body Mass Index (BMI): 30.1 Blood Pressure (mmHg): 137/89 Capillary Blood Glucose (mg/dl):  241 Reference Range: 80 - 120 mg / dl Electronic Signature(s) Signed: 05/26/2020 9:18:57 AM By: Sandre Kitty Entered By: Sandre Kitty on 05/25/2020 13:13:56

## 2020-06-23 ENCOUNTER — Encounter (HOSPITAL_BASED_OUTPATIENT_CLINIC_OR_DEPARTMENT_OTHER): Payer: Medicare Other | Attending: Internal Medicine | Admitting: Internal Medicine

## 2020-06-23 DIAGNOSIS — L97222 Non-pressure chronic ulcer of left calf with fat layer exposed: Secondary | ICD-10-CM | POA: Diagnosis present

## 2020-06-23 DIAGNOSIS — L97212 Non-pressure chronic ulcer of right calf with fat layer exposed: Secondary | ICD-10-CM | POA: Diagnosis not present

## 2020-06-23 DIAGNOSIS — Z87891 Personal history of nicotine dependence: Secondary | ICD-10-CM | POA: Diagnosis not present

## 2020-06-23 DIAGNOSIS — I87333 Chronic venous hypertension (idiopathic) with ulcer and inflammation of bilateral lower extremity: Secondary | ICD-10-CM | POA: Insufficient documentation

## 2020-06-23 DIAGNOSIS — I890 Lymphedema, not elsewhere classified: Secondary | ICD-10-CM | POA: Insufficient documentation

## 2020-06-23 NOTE — Progress Notes (Signed)
[name] ([id number]) Visit Report for 06/23/2020 HPI Details [name], [name] 06/23/2020 12:30 PM Medical Record Number: [id number] Patient Account Number: [id number] Date of Birth/Sex: Treating RN: 10-15-1955 (64 y.o. [name] Primary Care Provider: Herold [name] Other Clinician: Referring Provider: Treating Provider/Extender: [name] in Treatment: 17 History of Present Illness HPI Description: 07/07/15; this is a patient who is in hospital on 8/2 through 8/4. He had cellulitis and abscess of predominantly I think the left leg. He received IV antibiotics. Plain x-ray showed no osteomyelitis. An MRI of the left leg did not show osteomyelitis. Cultures showed no predominant organism. His hemoglobin A1c was 9.8. He has a history of venous stasis also peripheral vascular disease. He was discharged to [place] home. He really has extensive ulcerations on the left lateral leg including a major wound that communicates both posteriorly and superiorly w. When drainage coming out of 2 smaller areas. He has a smaller wound on the posterior medial left leg. He has more predominantly venous insufficiency wounds on predominantly the right medial leg above the ankle the right foot into sections. He has recently been put on Augmentin at the nursing home. Previous ABIs/arterial evaluation showed triphasic waves diffusely he does not have a major ischemic issue a left lower extremity venous duplex also exam showed no evidence of a DVT on 6/21 07/21/15; the patient arrives with really no major change. Culture I did last time was negative. He has not had a follow-up MRI I ordered. The wounds are macerated covered with a thick gelatinous surface slough. There is necrotic subcutaneous tissue 08/04/15; the patient arrives with a considerable improvement in the majority of his wound area. The area on the left leg now has what looks  to be a granulated base. Most of the wounds on the left leg required an aggressive surgical debridement to remove nonviable fibrinous eschar and subcutaneous tissue however after debridement most of this looks better. Although I had asked for repeat MRI of the left leg when he first came in here with grossly purulent material coming out of his wounds, it does not appear that this is been done and nor do I actually feel that strongly about it right now. He has severe surrounding venous insufficiency and inflammation. I don't believe he has significant PAD 08/25/15. In general there is still a considerable wound area here but with still extensive service adherent slough. The patient will not allow mechanical debridement due to pain. He did not tolerate Medihoney therefore we are left with Santyl for now. She would appear that he has severe surrounding venous stasis. There is no evidence of the infection that may have had something to do with the pathogenesis of these wounds 09/29/15; patient still has substantial wound area on the left leg with a cluster of several wounds on the lateral leg confluently on the left leg posteriorly and then a substantial wound on the medial leg. On the right leg a substantial wound medially and a small area on the right lateral foot. All of these underwent a substantial surgical debridement with curettes which she tolerated better than he has in the past. This started as a complex cellulitis in the face of chronic venous insufficiency and inflammation. 10/13/15; substantial cluster of wounds on the lateral aspect of his left leg confluently around to the other side. Extensive surgical debridement to remove for redness surface slough nonviable subcutaneous tissue. This is not an improvement. Also  substantial wound on the medial right leg which is largely unchanged. The etiology of this was felt to be a complex cellulitis in the summer of 2016 in the face of chronic venous  insufficiency and inflammation. The patient states that the wound on the right medial leg has been there for years off and on. 10/20/15; we are able to start [facility] to these extensive wound areas left greater than right on Tuesday after I spoke to the wound care nurse at the facility. He arrives here today for extensive surgical debridement for the wounds on the lateral aspect of his left leg posterior left leg, this is almost circumferential. He has a large wound on the medial aspect of his right leg although he finds this too painful for debridement 10/27/15; I continue to bring this patient back for frequent debridement/weekly debridement severe. We have been using Hydrofera Blue. Unfortunately this has not really had any improvement. The debridement surgery difficult and painful for the patient 11/23/15; this patient spent a complex hospitalization admitted with acute kidney injury, anemia, cellulitis of the lower extremity, he was felt to have sepsis pathophysiology although his blood cultures were negative. He had plain x-rays of both legs that were negative for osseous abnormalities. He was seen by infectious disease and placed on a workup for vasculitis that was negative including a biopsy 4 all were consistent with stasis dermatitis. Vital broad- spectrum antibiotics he continued to spike fevers. Urine and chest x-ray were negative Dopplers on admission ruled out a DVT All antibiotics were stopped on . 2/17. Since his return to [facility] skilled nursing facility I believe they have been using Xeroform 12/07/15 the patient arrives today with the area on his right medial leg actually looking quite stable. No debridement. The rest of his extensive wounds on the left lateral left posterior extending into the left medial leg all required extensive debridement. I think this is probably going to need to and up in the hands of plastic surgery. We'll attempt to change him back to [facility].  Arrange consultation with plastic surgery at [facility] for this almost circumferential wound on the left side 12/21/15 right leg covered in surface slough. This was debridement. Left leg extensive wounds all carefully examined. This is almost circumferential on the left side especially on the posterior calf. No debridement is necessary. 01/04/16 the patient has been to [facility] plastic surgery. Unfortunately it doesn't look like they had any of the prior workup on this patient. They're in the middle of vascular workup. It sounds as though they're applying Hydrofera Blue at the nursing home. 01/22/16; the patient has been back to see [facility]. There apparently making plans to possibly do skin grafts over his large venous insufficiency ulcerations. The patient tells me that he goes to hematology in [facility] and variably uses the term platelets red cells and the description of his problem. 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 I do not see any major difference in the wound on the right medial leg and circumferentially across the left medial to left lateral leg. Did not attempt to debride these today. Readmission: 05/05/18 on evaluation today patient presents for reevaluation here in our clinic although I have previously seen him in the skilled nursing facility over the past year and a half when I was working in the skilled nursing facility realm instead of covering in the clinic. With that being said during that time we had initiated most  recently dressings with Hydrofera Blue Dressing along with the [employer] which had done very well for him. Much better than the Kerlex Coban wraps. With that being said the patient had been doing fairly well and was making progress when I last saw him. Upon evaluation today it appears that the wounds are actually a little bit worse than when I last saw him although definitely not dramatically so. There does not appear  to be any evidence of infection which is good news. With that being said he has been tolerating the wraps without complication his left hip still gives him a lot of trouble which is his main issue as far as movement is concerned. Unbeknownst to me he has not had venous studies that I can find. He previously told me he had there were arterial studies noted back from 04/27/15 which showed that he had try phasic blood flow throughout and again his test appear to be completely normal as performed by Dr. Creig Hines. With that being said I cannot find where he had venous studies. The patient still states he thinks he did these definitely had no venous intervention at this point. Upon evaluation today it does not appear to me that the patient has any evidence of cellulitis. He does have some chronic venous insufficiency which I think has led to stasis dermatitis but again this is not appearing to be infected at this point. No fevers, chills, nausea, or vomiting noted at this time. READMISSION 06/30/2018 This is a now 64 year old man we have had in this clinic at multiple times in the past. Most recently he came in August and was seen by [facility] stone. It is not clear why he did not come back we asked him really did not get a straight answer. He is at [facility] skilled facility he is a man who has severe chronic venous insufficiency with lymphedema and has had severe wounds on his left greater than right leg. We had him here in 2016 and 17 ultimately referred him to [facility]. He had arterial studies and venous studies done at [facility] although I am not able to access these records I see they are actually done. He also had multiple biopsies that were negative for malignancy showing changes compatible with chronic inflammation. As I understand things in [place] they are using Iodoflex and Unna boots. I am not really sure how they are getting enough Iodoflex for the large wound area especially on his left  calf. Nevertheless the wounds look better than when I saw this in 2017. There were plans for him to have plastic surgery in 2018 and I think he was actually prepped for surgery however he was ultimately denied because the patient was an active smoker. The patient is not systemically unwell. The wounds are painful however given the size especially on the left this is not surprising. ABIs in this clinic were 0.78 on the left and 0.91 on the right 07/13/2018; this is a patient with bilateral severe venous ulcers with secondary lymphedema. The wounds are on the right medial calf and a substantial part of the left posterior calf and some involvement medially and laterally on the left. We changed him to silver alginate under Unna boot's last time. The wounds actually look fairly satisfactory today. 08/14/2018; this is a patient with severe bilateral venous stasis ulcers with secondary lymphedema left greater than right he is at [facility] using silver alginate under Unna boot's I do not think there is  much change on this visit versus last time I saw him a month ago Readmission: 11/04/18 patient presents today for follow-up concerning his bilateral lower extremity ulcers. His a right medial ankle ulcer and a left leg that has ulcers over a large proportion of the surface area between the ankle and knee. Unfortunately this does cause some discomfort for the patient although it doesn't seem to be as uncomfortable as it has been in the past. He is seen today for evaluation after a referral by [place] back to the clinic. Unfortunately has a lot of Slough noted on the surface of his wounds. He's been treated currently it sounds like with Dakin's soaked gauze and they have not been performing the Unna Boot wrap that was previously recommended. I'm not exactly sure what the reason for the change was. He does have less slough on the surface of the wound but again this is something that is often a constant issue for  him. Again he's had these wounds for many years. I've known him for two years at least during that time when I was taking care of them in the facility he is Artie had the wounds for several years prior. READMISSION 02/25/2020 [name] is a now 64 year old man. He has been in our clinic several times before most extensively in October 2016 through May 2017. At that time he had absolutely substantial bilateral lower extremity wounds secondary to chronic venous insufficiency and lymphedema. We ultimately referred him to [facility] he had a series of biopsies only showed suggestion of wound secondary to chronic venous insufficiency. He had a less extensive stay in our clinic in 2019 and then a single visit in February 2020. He is a resident at [facility] skilled facility. I think he has had intermittently had in facility wound care. He returns today. They are using silver alginate on the wounds although I am not sure what type of compression. Previously he has favored Unna boots. He is not felt to have an arterial issue. Past medical history; lymphedema and chronic venous insufficiency, diabetes mellitus, hypertension, congestive heart failure We did not do arterial studies today 04/27/2020 on evaluation patient appears to be doing really about the same as when I have known him and seen him in the past. He does have wounds on the right and left legs at this point. Fortunately there is no signs of active infection at this time. 9/2; 1 month follow-up. This is a man with severe chronic venous insufficiency and lymphedema. When I first met him he had horrible circumferential bilateral lower extremity ulcers. We have been using silver alginate up until early last month when it was changed to [facility] and Unna boot's more or less says maintenance dressings. Since the last time he was here the facility where he resides [facility] called to report they could no longer do Unna boot so he is apparently only  been receiving kerlix Coban the left leg is certainly a lot worse with almost circumferential large wounds especially lateral but now medial and posterior as well. He has a standard same looking wound on the right medial lower leg. 10/1; absolutely no improvement here. In fact I do not think anything much is changed. The substantial area on his left lateral and left posterior calf is still open may be slightly larger. He has a more modest wound on the right medial calf. None of these look any different. He comes in with a kerlix Coban wrap this will simply not  be adequate. He has too much edema in this leg He also is complaining a lot of pain in his left heel area. Electronic Signature(s) Signed: 06/23/2020 4:24:19 PM By: Linton Ham MD Entered By: Linton Ham on 06/23/2020 13:54:58 -------------------------------------------------------------------------------- Physical Exam Details [name], [name] 06/23/2020 12:30 PM Medical Record Number: [id number] Patient Account Number: [id number] Date of Birth/Sex: Treating RN: Jul 14, 1956 (64 y.o. [name] Primary Care Provider: Herold [name] Other Clinician: Referring Provider: Treating Provider/Extender: [name] in Treatment: 17 Constitutional Sitting or standing Blood Pressure is within target range for patient.. Pulse regular and within target range for patient.Marland Kitchen Respirations regular, non-labored and within target range.. Temperature is normal and within the target range for the patient.Marland Kitchen Appears in no distress. Cardiovascular Tense edema intense edema in both legs. Nonpitting. Changes of chronic venous insufficiency. Musculoskeletal Patient is incredibly tender over the metatarsal heads, plantar heel and Achilles insertion site on the left heel.. Notes Wound exam;. This is a chronic wound on the left lateral calf. Very large punched out area. No overt infection.  There is a smaller area on the right medial calf Electronic Signature(s) Signed: 06/23/2020 4:24:19 PM By: Linton Ham MD Entered By: Linton Ham on 06/23/2020 13:56:58 -------------------------------------------------------------------------------- Physician Orders Details [name], [name] 06/23/2020 12:30 PM Medical Record Number: [id number] Patient Account Number: [id number] Date of Birth/Sex: Treating RN: 11/18/1955 (64 y.o. [name] Primary Care Provider: Herold [name] Other Clinician: Referring Provider: Treating Provider/Extender: [name] in Treatment: 17 Verbal / Phone Orders: No Diagnosis Coding ICD-10 Coding Code Description [id number] Chronic venous hypertension (idiopathic) with ulcer and inflammation of bilateral lower extremity I89.0 Lymphedema, not elsewhere classified L97.222 Non-pressure chronic ulcer of left calf with fat layer exposed L97.212 Non-pressure chronic ulcer of right calf with fat layer exposed Follow-up Appointments Return appointment in 1 month. Dressing Change Frequency Wound #21 Left,Distal Lower Leg Change dressing three times week. Wound #22 Right,Medial Lower Leg Change dressing three times week. Skin Barriers/Peri-Wound Care Barrier cream Moisturizing lotion Wound Cleansing Clean wound with Wound Cleanser - or normal saline Primary Wound Dressing Wound #21 Left,Distal Lower Leg Hydrofera Blue - Classic Wound #22 Right,Medial Lower Leg Hydrofera Blue - Classic Secondary Dressing Wound #21 Left,Distal Lower Leg ABD pad Zetuvit or Kerramax - or equivalent super absorbent pad Wound #22 Right,Medial Lower Leg ABD pad Zetuvit or Kerramax - or equivalent super absorbent pad Edema Control 4 layer compression - Bilateral Avoid standing for long periods of time Elevate legs to the level of the heart or above for 30 minutes daily and/or when sitting, a frequency  of: - throughout the day Radiology X-ray, foot - X-Ray of Left Foot and Heel. Patient complaining of severe pain. FACILITY to schedule and obtain X-Ray - (ICD10 L97.222 - Non- pressure chronic ulcer of left calf with fat layer exposed) Electronic Signature(s) Signed: 06/23/2020 4:10:39 PM By: Kela Millin Signed: 06/23/2020 4:24:19 PM By: Linton Ham MD Entered By: Kela Millin on 06/23/2020 13:33:50 -------------------------------------------------------------------------------- Problem List Details [name], [name] 06/23/2020 12:30 PM Medical Record Number: [id number] Patient Account Number: [id number] Date of Birth/Sex: Treating RN: 06-27-1956 (64 y.o. [name] Primary Care Provider: Herold [name] Other Clinician: Referring Provider: Treating Provider/Extender: [name] in Treatment: 17 Active Problems ICD-10 Encounter Code Description Active Date MDM Diagnosis I87.333 Chronic venous hypertension (idiopathic) with ulcer and inflammation of 02/25/2020  No Yes bilateral lower extremity I89.0 Lymphedema, not elsewhere classified 02/25/2020 No Yes L97.222 Non-pressure chronic ulcer of left calf with fat layer exposed 02/25/2020 No Yes L97.212 Non-pressure chronic ulcer of right calf with fat layer exposed 02/25/2020 No Yes Inactive Problems Resolved Problems Electronic Signature(s) Signed: 06/23/2020 4:24:19 PM By: Linton Ham MD Entered By: Linton Ham on 06/23/2020 13:48:42 -------------------------------------------------------------------------------- Progress Note Details [name], [name] 06/23/2020 12:30 PM Medical Record Number: [id number] Patient Account Number: [id number] Date of Birth/Sex: Treating RN: [birth year] (64 y.o. [name] Primary Care Provider: Herold [name] Other Clinician: Referring Provider: Treating Provider/Extender: [name] in Treatment: 17 Subjective History of Present Illness (HPI) 07/07/15; this is a patient who is in hospital on 8/2 through 8/4. He had cellulitis and abscess of predominantly I think the left leg. He received IV antibiotics. Plain x-ray showed no osteomyelitis. An MRI of the left leg did not show osteomyelitis. Cultures showed no predominant organism. His hemoglobin A1c was 9.8. He has a history of venous stasis also peripheral vascular disease. He was discharged to [place] home. He really has extensive ulcerations on the left lateral leg including a major wound that communicates both posteriorly and superiorly w. When drainage coming out of 2 smaller areas. He has a smaller wound on the posterior medial left leg. He has more predominantly venous insufficiency wounds on predominantly the right medial leg above the ankle the right foot into sections. He has recently been put on Augmentin at the nursing home. Previous ABIs/arterial evaluation showed triphasic waves diffusely he does not have a major ischemic issue a left lower extremity venous duplex also exam showed no evidence of a DVT on 6/21 07/21/15; the patient arrives with really no major change. Culture I did last time was negative. He has not had a follow-up MRI I ordered. The wounds are macerated covered with a thick gelatinous surface slough. There is necrotic subcutaneous tissue 08/04/15; the patient arrives with a considerable improvement in the majority of his wound area. The area on the left leg now has what looks to be a granulated base. Most of the wounds on the left leg required an aggressive surgical debridement to remove nonviable fibrinous eschar and subcutaneous tissue however after debridement most of this looks better. Although I had asked for repeat MRI of the left leg when he first came in here with grossly purulent material coming out of his wounds, it does not appear that this is  been done and nor do I actually feel that strongly about it right now. He has severe surrounding venous insufficiency and inflammation. I don't believe he has significant PAD 08/25/15. In general there is still a considerable wound area here but with still extensive service adherent slough. The patient will not allow mechanical debridement due to pain. He did not tolerate Medihoney therefore we are left with Santyl for now. She would appear that he has severe surrounding venous stasis. There is no evidence of the infection that may have had something to do with the pathogenesis of these wounds 09/29/15; patient still has substantial wound area on the left leg with a cluster of several wounds on the lateral leg confluently on the left leg posteriorly and then a substantial wound on the medial leg. On the right leg a substantial wound medially and a small area on the right lateral foot. All of these underwent a substantial surgical debridement with curettes which she tolerated better  than he has in the past. This started as a complex cellulitis in the face of chronic venous insufficiency and inflammation. 10/13/15; substantial cluster of wounds on the lateral aspect of his left leg confluently around to the other side. Extensive surgical debridement to remove for redness surface slough nonviable subcutaneous tissue. This is not an improvement. Also substantial wound on the medial right leg which is largely unchanged. The etiology of this was felt to be a complex cellulitis in the summer of 2016 in the face of chronic venous insufficiency and inflammation. The patient states that the wound on the right medial leg has been there for years off and on. 10/20/15; we are able to start [facility] to these extensive wound areas left greater than right on Tuesday after I spoke to the wound care nurse at the facility. He arrives here today for extensive surgical debridement for the wounds on the lateral aspect of  his left leg posterior left leg, this is almost circumferential. He has a large wound on the medial aspect of his right leg although he finds this too painful for debridement 10/27/15; I continue to bring this patient back for frequent debridement/weekly debridement severe. We have been using Hydrofera Blue. Unfortunately this has not really had any improvement. The debridement surgery difficult and painful for the patient 11/23/15; this patient spent a complex hospitalization admitted with acute kidney injury, anemia, cellulitis of the lower extremity, he was felt to have sepsis pathophysiology although his blood cultures were negative. He had plain x-rays of both legs that were negative for osseous abnormalities. He was seen by infectious disease and placed on a workup for vasculitis that was negative including a biopsy o4 all were consistent with stasis dermatitis. Vital broad-spectrum antibiotics he continued to spike fevers. Urine and chest x-ray were negative Dopplers on admission ruled out a DVT All antibiotics were stopped on 2/17. . Since his return to [facility] skilled nursing facility I believe they have been using Xeroform 12/07/15 the patient arrives today with the area on his right medial leg actually looking quite stable. No debridement. The rest of his extensive wounds on the left lateral left posterior extending into the left medial leg all required extensive debridement. I think this is probably going to need to and up in the hands of plastic surgery. We'll attempt to change him back to [facility]. Arrange consultation with plastic surgery at [facility] for this almost circumferential wound on the left side 12/21/15 right leg covered in surface slough. This was debridement. Left leg extensive wounds all carefully examined. This is almost circumferential on the left side especially on the posterior calf. No debridement is necessary. 01/04/16 the patient has been to [facility]  plastic surgery. Unfortunately it doesn't look like they had any of the prior workup on this patient. They're in the middle of vascular workup. It sounds as though they're applying Hydrofera Blue at the nursing home. 01/22/16; the patient has been back to see [facility]. There apparently making plans to possibly do skin grafts over his large venous insufficiency ulcerations. The patient tells me that he goes to hematology in [facility] and variably uses the term platelets red cells and the description of his problem. 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 I do not see any major difference in the wound on the right medial leg and circumferentially across the left medial to left lateral leg. Did not attempt to debride these today. Readmission:  05/05/18 on evaluation today patient presents for reevaluation here in our clinic although I have previously seen him in the skilled nursing facility over the past year and a half when I was working in the skilled nursing facility realm instead of covering in the clinic. With that being said during that time we had initiated most recently dressings with Hydrofera Blue Dressing along with the [employer] which had done very well for him. Much better than the Kerlex Coban wraps. With that being said the patient had been doing fairly well and was making progress when I last saw him. Upon evaluation today it appears that the wounds are actually a little bit worse than when I last saw him although definitely not dramatically so. There does not appear to be any evidence of infection which is good news. With that being said he has been tolerating the wraps without complication his left hip still gives him a lot of trouble which is his main issue as far as movement is concerned. Unbeknownst to me he has not had venous studies that I can find. He previously told me he had there were arterial studies noted back from 04/27/15 which showed  that he had try phasic blood flow throughout and again his test appear to be completely normal as performed by Dr. Creig Hines. With that being said I cannot find where he had venous studies. The patient still states he thinks he did these definitely had no venous intervention at this point. Upon evaluation today it does not appear to me that the patient has any evidence of cellulitis. He does have some chronic venous insufficiency which I think has led to stasis dermatitis but again this is not appearing to be infected at this point. No fevers, chills, nausea, or vomiting noted at this time. READMISSION 06/30/2018 This is a now 64 year old man we have had in this clinic at multiple times in the past. Most recently he came in August and was seen by [facility] stone. It is not clear why he did not come back we asked him really did not get a straight answer. He is at [facility] skilled facility he is a man who has severe chronic venous insufficiency with lymphedema and has had severe wounds on his left greater than right leg. We had him here in 2016 and 17 ultimately referred him to [facility]. He had arterial studies and venous studies done at [facility] although I am not able to access these records I see they are actually done. He also had multiple biopsies that were negative for malignancy showing changes compatible with chronic inflammation. As I understand things in [place] they are using Iodoflex and Unna boots. I am not really sure how they are getting enough Iodoflex for the large wound area especially on his left calf. Nevertheless the wounds look better than when I saw this in 2017. There were plans for him to have plastic surgery in 2018 and I think he was actually prepped for surgery however he was ultimately denied because the patient was an active smoker. The patient is not systemically unwell. The wounds are painful however given the size especially on the left this is not  surprising. ABIs in this clinic were 0.78 on the left and 0.91 on the right 07/13/2018; this is a patient with bilateral severe venous ulcers with secondary lymphedema. The wounds are on the right medial calf and a substantial part of the left posterior calf and some involvement medially and  laterally on the left. We changed him to silver alginate under Unna boot's last time. The wounds actually look fairly satisfactory today. 08/14/2018; this is a patient with severe bilateral venous stasis ulcers with secondary lymphedema left greater than right he is at [facility] using silver alginate under Unna boot's I do not think there is much change on this visit versus last time I saw him a month ago Readmission: 11/04/18 patient presents today for follow-up concerning his bilateral lower extremity ulcers. His a right medial ankle ulcer and a left leg that has ulcers over a large proportion of the surface area between the ankle and knee. Unfortunately this does cause some discomfort for the patient although it doesn't seem to be as uncomfortable as it has been in the past. He is seen today for evaluation after a referral by [place] back to the clinic. Unfortunately has a lot of Slough noted on the surface of his wounds. He's been treated currently it sounds like with Dakin's soaked gauze and they have not been performing the Unna Boot wrap that was previously recommended. I'm not exactly sure what the reason for the change was. He does have less slough on the surface of the wound but again this is something that is often a constant issue for him. Again he's had these wounds for many years. I've known him for two years at least during that time when I was taking care of them in the facility he is Artie had the wounds for several years prior. READMISSION 02/25/2020 [name] is a now 64 year old man. He has been in our clinic several times before most extensively in October 2016 through May 2017. At that  time he had absolutely substantial bilateral lower extremity wounds secondary to chronic venous insufficiency and lymphedema. We ultimately referred him to [facility] he had a series of biopsies only showed suggestion of wound secondary to chronic venous insufficiency. He had a less extensive stay in our clinic in 2019 and then a single visit in February 2020. He is a resident at [facility] skilled facility. I think he has had intermittently had in facility wound care. He returns today. They are using silver alginate on the wounds although I am not sure what type of compression. Previously he has favored Unna boots. He is not felt to have an arterial issue. Past medical history; lymphedema and chronic venous insufficiency, diabetes mellitus, hypertension, congestive heart failure We did not do arterial studies today 04/27/2020 on evaluation patient appears to be doing really about the same as when I have known him and seen him in the past. He does have wounds on the right and left legs at this point. Fortunately there is no signs of active infection at this time. 9/2; 1 month follow-up. This is a man with severe chronic venous insufficiency and lymphedema. When I first met him he had horrible circumferential bilateral lower extremity ulcers. We have been using silver alginate up until early last month when it was changed to [facility] and Unna boot's more or less says maintenance dressings. Since the last time he was here the facility where he resides [facility] called to report they could no longer do Unna boot so he is apparently only been receiving kerlix Coban the left leg is certainly a lot worse with almost circumferential large wounds especially lateral but now medial and posterior as well. He has a standard same looking wound on the right medial lower leg. 10/1; absolutely no improvement here. In  fact I do not think anything much is changed. The substantial area on his left lateral and left  posterior calf is still open may be slightly larger. He has a more modest wound on the right medial calf. None of these look any different. He comes in with a kerlix Coban wrap this will simply not be adequate. He has too much edema in this leg He also is complaining a lot of pain in his left heel area. Objective Constitutional Sitting or standing Blood Pressure is within target range for patient.. Pulse regular and within target range for patient.Marland Kitchen Respirations regular, non-labored and within target range.. Temperature is normal and within the target range for the patient.Marland Kitchen Appears in no distress. Vitals Time Taken: 1:11 PM, Height: 77 in, Weight: 254 lbs, BMI: 30.1, Temperature: 98.7 F, Pulse: 73 bpm, Respiratory Rate: 18 breaths/min, Blood Pressure: 137/77 mmHg. Cardiovascular Tense edema intense edema in both legs. Nonpitting. Changes of chronic venous insufficiency. Musculoskeletal Patient is incredibly tender over the metatarsal heads, plantar heel and Achilles insertion site on the left heel.. General Notes: Wound exam;. This is a chronic wound on the left lateral calf. Very large punched out area. No overt infection. There is a smaller area on the right medial calf Integumentary (Hair, Skin) Wound #21 status is Open. Original cause of wound was Gradually Appeared. The wound is located on the Left,Distal Lower Leg. The wound measures 18cm length x 17cm width x 0.4cm depth; 240.332cm^2 area and 96.133cm^3 volume. There is Fat Layer (Subcutaneous Tissue) exposed. There is no tunneling or undermining noted. There is a large amount of serosanguineous drainage noted. The wound margin is distinct with the outline attached to the wound base. There is medium (34-66%) red granulation within the wound bed. There is a medium (34-66%) amount of necrotic tissue within the wound bed including Adherent Slough. Wound #22 status is Open. Original cause of wound was Gradually Appeared. The wound is  located on the Right,Medial Lower Leg. The wound measures 4.5cm length x 4cm width x 0.2cm depth; 14.137cm^2 area and 2.827cm^3 volume. There is Fat Layer (Subcutaneous Tissue) exposed. There is no tunneling or undermining noted. There is a medium amount of serosanguineous drainage noted. The wound margin is flat and intact. There is medium (34-66%) red, pink granulation within the wound bed. There is a medium (34-66%) amount of necrotic tissue within the wound bed including Adherent Slough. Assessment Active Problems ICD-10 Chronic venous hypertension (idiopathic) with ulcer and inflammation of bilateral lower extremity Lymphedema, not elsewhere classified Non-pressure chronic ulcer of left calf with fat layer exposed Non-pressure chronic ulcer of right calf with fat layer exposed Procedures Wound #21 Pre-procedure diagnosis of Wound #21 is a Venous Leg Ulcer located on the Left,Distal Lower Leg . There was a Four Layer Compression Therapy Procedure by [name], RN. Post procedure Diagnosis Wound #21: Same as Pre-Procedure Wound #22 Pre-procedure diagnosis of Wound #22 is a Venous Leg Ulcer located on the Right,Medial Lower Leg . There was a Four Layer Compression Therapy Procedure by [name], RN. Post procedure Diagnosis Wound #22: Same as Pre-Procedure Plan Follow-up Appointments: Return appointment in 1 month. Dressing Change Frequency: Wound #21 Left,Distal Lower Leg: Change dressing three times week. Wound #22 Right,Medial Lower Leg: Change dressing three times week. Skin Barriers/Peri-Wound Care: Barrier cream Moisturizing lotion Wound Cleansing: Clean wound with Wound Cleanser - or normal saline Primary Wound Dressing: Wound #21 Left,Distal Lower Leg: Hydrofera Blue - Classic Wound #22 Right,Medial Lower Leg: Hydrofera Blue -  Classic Secondary Dressing: Wound #21 Left,Distal Lower Leg: ABD pad Zetuvit or Kerramax - or equivalent super absorbent pad Wound  #22 Right,Medial Lower Leg: ABD pad Zetuvit or Kerramax - or equivalent super absorbent pad Edema Control: 4 layer compression - Bilateral Avoid standing for long periods of time Elevate legs to the level of the heart or above for 30 minutes daily and/or when sitting, a frequency of: - throughout the day Radiology ordered were: X-ray, foot - X-Ray of Left Foot and Heel. Patient complaining of severe pain. FACILITY to schedule and obtain X-Ray 1 still HFB 2we have asked for more aggressive compression 3 consider Deep tissue culture 4 suspect plantar fascitis. I have asked for an sray of the heel Electronic Signature(s) Signed: 06/23/2020 4:24:19 PM By: Linton Ham MD Entered By: Linton Ham on 06/23/2020 13:59:36 -------------------------------------------------------------------------------- SuperBill Details [name], [name] 06/23/2020 Medical Record Number: [id number] Patient Account Number: [id number] Date of Birth/Sex: Treating RN: 18-Oct-1955 (64 y.o. [name] Primary Care Provider: Herold [name] Other Clinician: Referring Provider: Treating Provider/Extender: [name] in Treatment: 17 Diagnosis Coding ICD-10 Codes Code Description [phone] Chronic venous hypertension (idiopathic) with ulcer and inflammation of bilateral lower extremity I89.0 Lymphedema, not elsewhere classified L97.222 Non-pressure chronic ulcer of left calf with fat layer exposed L97.212 Non-pressure chronic ulcer of right calf with fat layer exposed Facility Procedures CPT4: Code [id number] Description: 81 BILATERAL: Application of multi-layer venous compression system; leg (below knee), including ankle and t. Modifier: Quantity: 1 Physician Procedures : CPT4 Code Description Modifier [id number] - WC PHYS LEVEL 3 - EST PT ICD-10 Diagnosis Description I87.333 Chronic venous hypertension (idiopathic) with ulcer and  inflammation of bilateral lower extremity I89.0 Lymphedema, not elsewhere classified  L97.222 Non-pressure chronic ulcer of left calf with fat layer exposed L97.212 Non-pressure chronic ulcer of right calf with fat layer exposed Quantity: 1 Electronic Signature(s) Signed: 06/23/2020 4:24:19 PM By: Linton Ham MD Entered By: Linton Ham on 06/23/2020 13:59:58

## 2020-06-23 NOTE — Progress Notes (Signed)
[name], [name] ([id number]) Visit Report for 06/23/2020 Arrival Information Details [name], [name] 06/23/2020 12:30 PM Medical Record Number: [id number] Patient Account Number: [id number] Date of Birth/Sex: Treating RN: 1956/01/17 (64 y.o. [name]) Carlene Coria Primary Care Doratha Mcswain: [name] Other Clinician: Referring Elleanna Melling: Treating Shawntel Farnworth/Extender: [name] in Treatment: 67 Visit Information History Since Last Visit All ordered tests and consults were completed: No Patient Arrived: Wheel Chair Added or deleted any medications: No Arrival Time: 12:44 Any new allergies or adverse reactions: No Accompanied By: self Had a fall or experienced change in No Transfer Assistance: Manual activities of daily living that may affect Patient Identification Verified: Yes risk of falls: Secondary Verification Process Completed: Yes Signs or symptoms of abuse/neglect since last visito No Patient Requires Transmission-Based Precautions: No Hospitalized since last visit: No Patient Has Alerts: No Implantable device outside of the clinic excluding No cellular tissue based products placed in the center since last visit: Has Dressing in Place as Prescribed: Yes Pain Present Now: Yes Electronic Signature(s) Signed: 06/23/2020 4:20:16 PM By: Carlene Coria RN Entered By: Carlene Coria on 06/23/2020 13:11:28 -------------------------------------------------------------------------------- Compression Therapy Details [name], [name] 06/23/2020 12:30 PM Medical Record Number: [id number] Patient Account Number: [id number] Date of Birth/Sex: Treating RN: 28-Feb-1956 (64 y.o. [name] Primary Care Jnya Brossard: [name] Other Clinician: Referring Merriel Zinger: Treating Macarthur Lorusso/Extender: [name] in Treatment: 17 Compression Therapy Performed for Wound Assessment: Wound  #21 Left,Distal Lower Leg Performed By: Clinician [name], RN Compression Type: Four Layer Post Procedure Diagnosis Same as Pre-procedure Electronic Signature(s) Signed: 06/23/2020 4:10:39 PM By: Kela Millin Entered By: Kela Millin on 06/23/2020 13:24:52 -------------------------------------------------------------------------------- Compression Therapy Details [name], [name] 06/23/2020 12:30 PM Medical Record Number: [id number] Patient Account Number: [id number] Date of Birth/Sex: Treating RN: Aug 22, 1956 (64 y.o. [name] Primary Care Gennell How: [name] Other Clinician: Referring Jesstin Studstill: Treating Dione Mccombie/Extender: [name] in Treatment: 17 Compression Therapy Performed for Wound Assessment: Wound #22 Right,Medial Lower Leg Performed By: Clinician [name], RN Compression Type: Four Layer Post Procedure Diagnosis Same as Pre-procedure Electronic Signature(s) Signed: 06/23/2020 4:10:39 PM By: Kela Millin Entered By: Kela Millin on 06/23/2020 13:24:52 -------------------------------------------------------------------------------- Encounter Discharge Information Details [name], [name] 06/23/2020 12:30 PM Medical Record Number: [id number] Patient Account Number: [id number] Date of Birth/Sex: Treating RN: 1955-12-15 (64 y.o. [name] Primary Care Saori Umholtz: [name] Other Clinician: Referring Terrianna Holsclaw: Treating Tyrelle Raczka/Extender: [name] in Treatment: 17 Encounter Discharge Information Items Discharge Condition: Stable Ambulatory Status: Ambulatory Discharge Destination: Home Transportation: Private Auto Accompanied By: self Schedule Follow-up Appointment: Yes Clinical Summary of Care: Electronic Signature(s) Signed: 06/23/2020 4:18:11 PM By: [name] Entered By: [name]  on 06/23/2020 14:24:49 -------------------------------------------------------------------------------- Lower Extremity Assessment Details [name], [name] 06/23/2020 12:30 PM Medical Record Number: [id number] Patient Account Number: [id number] Date of Birth/Sex: Treating RN: 1955/11/18 (64 y.o. [name] Primary Care Cristianna Cyr: [name] Other Clinician: Referring Shareece Bultman: Treating Ginnifer Creelman/Extender: Bayard Hugger Weeks in Treatment: 17 Edema Assessment Assessed: Shirlyn Goltz: No] [Right: No] E[Left: dema] [Right: :] Calf Left: Right: Point of Measurement: 52 cm From Medial Instep 37 cm 37 cm Ankle Left: Right: Point of Measurement: 10 cm From Medial Instep 25 cm 25 cm Electronic Signature(s) Signed: 06/23/2020 4:20:16 PM By: Carlene Coria RN Entered By: Dolores Lory  Carrie on 06/23/2020 13:12:48 -------------------------------------------------------------------------------- Multi Wound Chart Details [name], [name] 06/23/2020 12:30 PM Medical Record Number: [id number] Patient Account Number: [id number] Date of Birth/Sex: Treating RN: 10-15-1955 (64 y.o. [name] Primary Care Zannie Runkle: [name] Other Clinician: Referring Balthazar Dooly: Treating Merrick Feutz/Extender: [name] in Treatment: 17 Vital Signs Height(in): 77 Pulse(bpm): 73 Weight(lbs): 254 Blood Pressure(mmHg): 137/77 Body Mass Index(BMI): 30 Temperature(F): 98.7 Respiratory Rate(breaths/min): 18 Photos: [21:No Photos Left, Distal Lower Leg] [22:No Photos Right, Medial Lower Leg] [N/A:N/A N/A] Wound Location: [21:Gradually Appeared] [22:Gradually Appeared] [N/A:N/A] Wounding Event: [21:Venous Leg Ulcer] [22:Venous Leg Ulcer] [N/A:N/A] Primary Etiology: [21:Anemia, Sleep Apnea, Congestive] [22:Anemia, Sleep Apnea, Congestive] [N/A:N/A] Comorbid History: [21:Heart Failure, Hypertension,  Peripheral Heart Failure, Hypertension, Peripheral Venous Disease, Type II Diabetes, Osteoarthritis, Neuropathy 11/22/2019] [22:Venous Disease, Type II Diabetes, Osteoarthritis, Neuropathy 12/23/2019]  [N/A:N/A] Date Acquired: [21:17] [22:17] [N/A:N/A] Weeks of Treatment: [21:Open] [22:Open] [N/A:N/A] Wound Status: [21:18x17x0.4] [22:4.5x4x0.2] [N/A:N/A] Measurements L x W x D (cm) [21:240.332] [40:98.119] [N/A:N/A] A (cm) : rea [14:78.295] [22:2.827] [N/A:N/A] Volume (cm) : [21:-53.00%] [22:37.50%] [N/A:N/A] % Reduction in Area: [21:-2.00%] [22:37.50%] [N/A:N/A] % Reduction in Volume: [21:Full Thickness Without Exposed] [22:Full Thickness Without Exposed] [N/A:N/A] Classification: [21:Support Structures Large] [22:Support Structures Medium] [N/A:N/A] Exudate Amount: [21:Serosanguineous] [22:Serosanguineous] [N/A:N/A] Exudate Type: [21:red, brown] [22:red, brown] [N/A:N/A] Exudate Color: [21:Distinct, outline attached] [22:Flat and Intact] [N/A:N/A] Wound Margin: [21:Medium (34-66%)] [22:Medium (34-66%)] [N/A:N/A] Granulation Amount: [21:Red] [22:Red, Pink] [N/A:N/A] Granulation Quality: [21:Medium (34-66%)] [22:Medium (34-66%)] [N/A:N/A] Necrotic Amount: [21:Fat Layer (Subcutaneous Tissue): Yes Fat Layer (Subcutaneous Tissue): Yes N/A] Exposed Structures: [21:Fascia: No Tendon: No Muscle: No Joint: No Bone: No Small (1-33%)] [22:Fascia: No Tendon: No Muscle: No Joint: No Bone: No Small (1-33%)] [N/A:N/A] Epithelialization: [21:Compression Therapy] [22:Compression Therapy] [N/A:N/A] Procedures Performed: Treatment Notes Electronic Signature(s) Signed: 06/23/2020 4:10:39 PM By: Kela Millin Signed: 06/23/2020 4:24:19 PM By: Linton Ham MD Entered By: Linton Ham on 06/23/2020 13:53:30 -------------------------------------------------------------------------------- Multi-Disciplinary Care Plan Details [name], [name] 06/23/2020 12:30 PM Medical  Record Number: [id number] Patient Account Number: [id number] Date of Birth/Sex: Treating RN: Oct 17, 1955 (64 y.o. [name] Primary Care Kaycee Mcgaugh: [name] Other Clinician: Referring Vikram Tillett: Treating Loann Chahal/Extender: [name] in Treatment: 17 Active Inactive Wound/Skin Impairment Nursing Diagnoses: Impaired tissue integrity Goals: Patient/caregiver will verbalize understanding of skin care regimen Date Initiated: 04/27/2020 Target Resolution Date: 07/24/2020 Goal Status: Active Ulcer/skin breakdown will have a volume reduction of 50% by week 8 Date Initiated: 02/25/2020 Date Inactivated: 04/27/2020 Target Resolution Date: 04/14/2020 Goal Status: Met Interventions: Provide education on ulcer and skin care Notes: Electronic Signature(s) Signed: 06/23/2020 4:10:39 PM By: Kela Millin Entered By: Kela Millin on 06/23/2020 13:11:22 -------------------------------------------------------------------------------- Pain Assessment Details [name], [name] 06/23/2020 12:30 PM Medical Record Number: [id number] Patient Account Number: [id number] Date of Birth/Sex: Treating RN: 21-Mar-1956 (64 y.o. [name] Primary Care Terald Jump: [name] Other Clinician: Referring Ural Acree: Treating Jarren Para/Extender: [name] in Treatment: 17 Active Problems Location of Pain Severity and Description of Pain Patient Has Paino Yes Site Locations With Dressing Change: Yes Duration of the Pain. Constant / Intermittento Constant Rate the pain. Current Pain Level: 7 Worst Pain Level: 10 Least Pain Level: 2 Character of Pain Describe the Pain: Burning, Sharp Pain Management and Medication Current Pain Management: Medication: Yes Cold Application: No Rest: Yes Massage: No Activity: No T.E.N.S.: No Heat Application: No Leg drop or elevation: No Is  the Current Pain  Management Adequate: Inadequate How does your wound impact your activities of daily livingo Sleep: Yes Bathing: No Appetite: Yes Relationship With Others: No Bladder Continence: No Emotions: No Bowel Continence: No Work: No Toileting: No Drive: No Dressing: No Hobbies: No Electronic Signature(s) Signed: 06/23/2020 4:20:16 PM By: Carlene Coria RN Entered By: Carlene Coria on 06/23/2020 13:12:37 -------------------------------------------------------------------------------- Patient/Caregiver Education Details Patient Name: Date of Service: Lorel Monaco 10/1/2021andnbsp12:30 PM Medical Record Number: [id number] Patient Account Number: [id number] Date of Birth/Gender: Treating RN: 05-May-1956 (64 y.o. [name] Primary Care Physician: [name] Other Clinician: Referring Physician: Treating Physician/Extender: [name] in Treatment: 17 Education Assessment Education Provided To: Patient Education Topics Provided Wound/Skin Impairment: Handouts: Caring for Your Ulcer Methods: Explain/Verbal Responses: State content correctly Electronic Signature(s) Signed: 06/23/2020 4:10:39 PM By: Kela Millin Signed: 06/23/2020 4:10:39 PM By: Kela Millin Entered By: Kela Millin on 06/23/2020 13:11:35 -------------------------------------------------------------------------------- Wound Assessment Details [name], [name] 06/23/2020 12:30 PM Medical Record Number: [id number] Patient Account Number: [id number] Date of Birth/Sex: Treating RN: [birth year] (64 y.o. [name]) Carlene Coria Primary Care Ulises Wolfinger: [name] Other Clinician: Referring Aayushi Solorzano: Treating Mariame Rybolt/Extender: [name] in Treatment: 17 Wound Status Wound Number: 21 Primary Venous Leg Ulcer Etiology: Wound Location: Left, Distal Lower Leg Wound Open Wounding Event: Gradually  Appeared Status: Date Acquired: 11/22/2019 Comorbid Anemia, Sleep Apnea, Congestive Heart Failure, Hypertension, Weeks Of Treatment: 17 History: Peripheral Venous Disease, Type II Diabetes, Osteoarthritis, Clustered Wound: No Neuropathy Wound Measurements Length: (cm) 18 Width: (cm) 17 Depth: (cm) 0.4 Area: (cm) 240.332 Volume: (cm) 96.133 % Reduction in Area: -53% % Reduction in Volume: -2% Epithelialization: Small (1-33%) Tunneling: No Undermining: No Wound Description Classification: Full Thickness Without Exposed Support Structures Wound Margin: Distinct, outline attached Exudate Amount: Large Exudate Type: Serosanguineous Exudate Color: red, brown Foul Odor After Cleansing: No Slough/Fibrino Yes Wound Bed Granulation Amount: Medium (34-66%) Exposed Structure Granulation Quality: Red Fascia Exposed: No Necrotic Amount: Medium (34-66%) Fat Layer (Subcutaneous Tissue) Exposed: Yes Necrotic Quality: Adherent Slough Tendon Exposed: No Muscle Exposed: No Joint Exposed: No Bone Exposed: No Treatment Notes Wound #21 (Left, Distal Lower Leg) 1. Cleanse With Wound Cleanser Soap and water 2. Periwound Care Barrier cream Moisturizing lotion 3. Primary Dressing Applied Hydrofera Blue 4. Secondary Dressing ABD Pad Kerramax/Xtrasorb 6. Support Layer Applied 4 layer compression wrap Notes netting. Electronic Signature(s) Signed: 06/23/2020 4:20:16 PM By: Carlene Coria RN Entered By: Carlene Coria on 06/23/2020 13:13:23 -------------------------------------------------------------------------------- Wound Assessment Details [name], [name] 06/23/2020 12:30 PM Medical Record Number: [id number] Patient Account Number: [id number] Date of Birth/Sex: Treating RN: 1956-07-25 (64 y.o. [name]) Carlene Coria Primary Care Mauricio Dahlen: [name] Other Clinician: Referring Joanell Cressler: Treating Teondra Newburg/Extender: [name] in  Treatment: 17 Wound Status Wound Number: 22 Primary Venous Leg Ulcer Etiology: Wound Location: Right, Medial Lower Leg Wound Open Wounding Event: Gradually Appeared Status: Date Acquired: 12/23/2019 Comorbid Anemia, Sleep Apnea, Congestive Heart Failure, Hypertension, Weeks Of Treatment: 17 History: Peripheral Venous Disease, Type II Diabetes, Osteoarthritis, Clustered Wound: No Neuropathy Wound Measurements Length: (cm) 4.5 Width: (cm) 4 Depth: (cm) 0.2 Area: (cm) 14.137 Volume: (cm) 2.827 % Reduction in Area: 37.5% % Reduction in Volume: 37.5% Epithelialization: Small (1-33%) Tunneling: No Undermining: No Wound Description Classification: Full Thickness Without Exposed Support Structures Wound Margin: Flat and Intact Exudate Amount: Medium Exudate Type: Serosanguineous Exudate Color: red, brown Foul Odor After Cleansing: No  Slough/Fibrino Yes Wound Bed Granulation Amount: Medium (34-66%) Exposed Structure Granulation Quality: Red, Pink Fascia Exposed: No Necrotic Amount: Medium (34-66%) Fat Layer (Subcutaneous Tissue) Exposed: Yes Necrotic Quality: Adherent Slough Tendon Exposed: No Muscle Exposed: No Joint Exposed: No Bone Exposed: No Treatment Notes Wound #22 (Right, Medial Lower Leg) 1. Cleanse With Wound Cleanser Soap and water 2. Periwound Care Barrier cream Moisturizing lotion 3. Primary Dressing Applied Hydrofera Blue 4. Secondary Dressing ABD Pad Kerramax/Xtrasorb 6. Support Layer Applied 4 layer compression wrap Notes netting. Electronic Signature(s) Signed: 06/23/2020 4:20:16 PM By: Carlene Coria RN Entered By: Carlene Coria on 06/23/2020 13:13:32 -------------------------------------------------------------------------------- Vitals Details [name], [name] 06/23/2020 12:30 PM Medical Record Number: [id number] Patient Account Number: [id number] Date of Birth/Sex: Treating RN: [birth year] (64 y.o. [name]) Carlene Coria Primary Care Markitta Ausburn: [name] Other Clinician: Referring Islay Polanco: Treating Daemon Dowty/Extender: [name] in Treatment: 17 Vital Signs Time Taken: 13:11 Temperature (F): 98.7 Height (in): 77 Pulse (bpm): 73 Weight (lbs): 254 Respiratory Rate (breaths/min): 18 Body Mass Index (BMI): 30.1 Blood Pressure (mmHg): 137/77 Reference Range: 80 - 120 mg / dl Electronic Signature(s) Signed: 06/23/2020 4:20:16 PM By: Carlene Coria RN Entered By: Carlene Coria on 06/23/2020 13:12:00

## 2020-07-24 ENCOUNTER — Encounter (HOSPITAL_BASED_OUTPATIENT_CLINIC_OR_DEPARTMENT_OTHER): Payer: Medicare Other | Attending: Internal Medicine | Admitting: Internal Medicine

## 2020-07-31 ENCOUNTER — Other Ambulatory Visit: Payer: Self-pay

## 2020-07-31 ENCOUNTER — Encounter (HOSPITAL_BASED_OUTPATIENT_CLINIC_OR_DEPARTMENT_OTHER): Payer: Medicare Other | Attending: Internal Medicine | Admitting: Internal Medicine

## 2020-07-31 DIAGNOSIS — L97222 Non-pressure chronic ulcer of left calf with fat layer exposed: Secondary | ICD-10-CM | POA: Insufficient documentation

## 2020-07-31 DIAGNOSIS — I890 Lymphedema, not elsewhere classified: Secondary | ICD-10-CM | POA: Insufficient documentation

## 2020-07-31 DIAGNOSIS — I87333 Chronic venous hypertension (idiopathic) with ulcer and inflammation of bilateral lower extremity: Secondary | ICD-10-CM | POA: Diagnosis not present

## 2020-07-31 DIAGNOSIS — L97212 Non-pressure chronic ulcer of right calf with fat layer exposed: Secondary | ICD-10-CM | POA: Insufficient documentation

## 2020-07-31 DIAGNOSIS — I872 Venous insufficiency (chronic) (peripheral): Secondary | ICD-10-CM | POA: Insufficient documentation

## 2020-07-31 DIAGNOSIS — E1151 Type 2 diabetes mellitus with diabetic peripheral angiopathy without gangrene: Secondary | ICD-10-CM | POA: Diagnosis not present

## 2020-07-31 DIAGNOSIS — Z882 Allergy status to sulfonamides status: Secondary | ICD-10-CM | POA: Insufficient documentation

## 2020-07-31 DIAGNOSIS — E11622 Type 2 diabetes mellitus with other skin ulcer: Secondary | ICD-10-CM | POA: Diagnosis not present

## 2020-08-01 NOTE — Progress Notes (Signed)
[name], [name] ([id number]) Visit Report for 07/31/2020 Arrival Information Details [name], [name] 07/31/2020 9:45 [name] M Medical Record Number: [id number] Patient Account Number: [id number] Date of Birth/Sex: Treating RN: [birth year] (64 y.o. [name] Primary Care Shana Zavaleta: [name] Other Clinician: Referring Roniya Tetro: Treating [name]/Extender: [name] in Treatment: 27 Visit Information History Since Last Visit Added or deleted any medications: No Patient Arrived: Wheel Chair Any new allergies or adverse reactions: No Arrival Time: 10:21 Had [name] fall or experienced change in No Accompanied By: self activities of daily living that may affect Transfer Assistance: None risk of falls: Patient Identification Verified: Yes Signs or symptoms of abuse/neglect since last visito No Secondary Verification Process Completed: Yes Hospitalized since last visit: No Patient Requires Transmission-Based Precautions: No Implantable device outside of the clinic excluding No Patient Has Alerts: No cellular tissue based products placed in the center since last visit: Has Dressing in Place as Prescribed: Yes Pain Present Now: Yes Electronic Signature(s) Signed: 07/31/2020 4:07:04 PM By: Sandre Kitty Entered By: Sandre Kitty on 07/31/2020 10:22:14 -------------------------------------------------------------------------------- Compression Therapy Details [name], [name] 07/31/2020 9:45 [name] M Medical Record Number: [id number] Patient Account Number: [id number] Date of Birth/Sex: Treating RN: 1956/02/15 (64 y.o. [name] Primary Care Jacyln Carmer: [name] Other Clinician: Referring Blue Winther: Treating [name]/Extender: [name] in Treatment: 22 Compression Therapy Performed for Wound Assessment: Wound #21 Left,Distal Lower Leg Performed By: Clinician  Levan Hurst, RN Compression Type: Four Layer Post Procedure Diagnosis Same as Pre-procedure Electronic Signature(s) Signed: 07/31/2020 6:16:40 PM By: Levan Hurst RN, BSN Entered By: Levan Hurst on 07/31/2020 11:00:49 -------------------------------------------------------------------------------- Compression Therapy Details [name], [name] 07/31/2020 9:45 [name] M Medical Record Number: [id number] Patient Account Number: [id number] Date of Birth/Sex: Treating RN: 10/19/1955 (64 y.o. [name] Primary Care Daysean Tinkham: [name] Other Clinician: Referring Michalina Calbert: Treating Avid Guillette/Extender: [name] in Treatment: 22 Compression Therapy Performed for Wound Assessment: Wound #22 Right,Medial Lower Leg Performed By: Clinician Levan Hurst, RN Compression Type: Four Layer Post Procedure Diagnosis Same as Pre-procedure Electronic Signature(s) Signed: 07/31/2020 6:16:40 PM By: Levan Hurst RN, BSN Entered By: Levan Hurst on 07/31/2020 11:00:49 -------------------------------------------------------------------------------- Encounter Discharge Information Details [name], [name] 07/31/2020 9:45 [name] M Medical Record Number: [id number] Patient Account Number: [id number] Date of Birth/Sex: Treating RN: Jan 17, 1956 (64 y.o. [name] Primary Care [name]: [name] Other Clinician: Referring Nahuel Wilbert: Treating [place] Sloop/Extender: [name] in Treatment: 22 Encounter Discharge Information Items Discharge Condition: Stable Ambulatory Status: Wheelchair Discharge Destination: [place] Telephoned: No Orders Sent: Yes Transportation: Private Auto Accompanied By: self Schedule Follow-up Appointment: Yes Clinical Summary of Care: Electronic Signature(s) Signed: 07/31/2020 5:46:03 PM By: [name] Entered By: [name] on 07/31/2020 11:34:30 -------------------------------------------------------------------------------- Lower Extremity Assessment Details [name], [name] 07/31/2020 9:45 [name] M Medical Record Number: [id number] Patient Account Number: [id number] Date of Birth/Sex: Treating RN: 02/04/1956 (64 y.o. [name] Primary Care [name]: [name] Other Clinician: Referring Zinia Innocent: Treating Ronee Ranganathan/Extender: Bayard Hugger Weeks in Treatment: 22 Edema Assessment Assessed: Shirlyn Goltz: No] [Right: No] E[Left: dema] [Right: :] Calf Left: Right: Point of Measurement: 52 cm From Medial Instep 37.2 cm 37 cm Ankle Left: Right: Point of Measurement: 10 cm From Medial Instep 24 cm 25 cm Electronic Signature(s) Signed: 07/31/2020 5:51:27 PM  By: Carlene Coria RN Entered By: Carlene Coria on 07/31/2020 10:33:22 -------------------------------------------------------------------------------- Multi Wound Chart Details [name], [name] 07/31/2020 9:45 [name] M Medical Record Number: [id number] Patient Account Number: [id number] Date of Birth/Sex: Treating RN: 1955-10-29 (64 y.o. [name] Primary Care [name]: [name] Other Clinician: Referring [name]: Treating [name]/Extender: [name] in Treatment: 22 Vital Signs Height(in): 77 Pulse(bpm): 90 Weight(lbs): 35 Blood Pressure(mmHg): 148/80 Body Mass Index(BMI): 30 Temperature(F): 98.8 Respiratory Rate(breaths/min): 18 Photos: [21:No Photos Left, Distal Lower Leg] [22:No Photos Right, Medial Lower Leg] [[name]:[name] [name]] Wound Location: [21:Gradually Appeared] [22:Gradually Appeared] [[name]:[name]] Wounding Event: [21:Venous Leg Ulcer] [22:Venous Leg Ulcer] [[name]:[name]] Primary Etiology: [21:Anemia, Sleep Apnea, Congestive] [22:Anemia, Sleep Apnea, Congestive] [[name]:[name]] Comorbid History: [21:Heart Failure, Hypertension,  Peripheral Heart Failure, Hypertension, Peripheral Venous Disease, Type II Diabetes, Osteoarthritis, Neuropathy 11/22/2019] [22:Venous Disease, Type II Diabetes, Osteoarthritis, Neuropathy 12/23/2019]  [[name]:[name]] Date Acquired: [21:22] [22:22] [[name]:[name]] Weeks of Treatment: [21:Open] [22:Open] [[name]:[name]] Wound Status: [21:17.2x16.5x0.2] [22:4.5x3.5x0.2] [[name]:[name]] Measurements L x W x D (cm) [21:222.896] [22:12.37] [[name]:[name]] [name] (cm) : rea [21:44.579] [22:2.474] [[name]:[name]] Volume (cm) : [21:-41.90%] [22:45.30%] [[name]:[name]] % Reduction in Area: [21:52.70%] [22:45.30%] [[name]:[name]] % Reduction in Volume: [21:Full Thickness Without Exposed] [22:Full Thickness Without Exposed] [[name]:[name]] Classification: [21:Support Structures Medium] [22:Support Structures Large] [[name]:[name]] Exudate Amount: [21:Serosanguineous] [22:Sanguinous] [[name]:[name]] Exudate Type: [21:red, brown] [22:red] [[name]:[name]] Exudate Color: [21:Distinct, outline attached] [22:Flat and Intact] [[name]:[name]] Wound Margin: [21:Medium (34-66%)] [22:Medium (34-66%)] [[name]:[name]] Granulation Amount: [21:Red] [22:Red, Pink] [[name]:[name]] Granulation Quality: [21:Medium (34-66%)] [22:Medium (34-66%)] [[name]:[name]] Necrotic Amount: [21:Fat Layer (Subcutaneous Tissue): Yes Fat Layer (Subcutaneous Tissue): Yes [name]] Exposed Structures: [21:Fascia: No Tendon: No Muscle: No Joint: No Bone: No Small (1-33%)] [22:Fascia: No Tendon: No Muscle: No Joint: No Bone: No Small (1-33%)] [[name]:[name]] Epithelialization: [21:Compression Therapy] [22:Compression Therapy] [[name]:[name]] Treatment Notes Wound #21 (Left, Distal Lower Leg) 1. Cleanse With Wound Cleanser Soap and water 2. Periwound Care Barrier cream Moisturizing lotion 3. Primary Dressing Applied Calcium Alginate Ag 4. Secondary Dressing ABD Pad Dry Gauze Kerramax/Xtrasorb 6. Support Layer Applied 4 layer compression wrap Notes netting. Wound #22 (Right, Medial Lower Leg) 1. Cleanse With Wound Cleanser Soap and water 2.  Periwound Care Barrier cream Moisturizing lotion 3. Primary Dressing Applied Calcium Alginate Ag 4. Secondary Dressing ABD Pad Dry Gauze Kerramax/Xtrasorb 6. Support Layer Applied 4 layer compression wrap Notes netting. Electronic Signature(s) Signed: 07/31/2020 6:16:40 PM By: Levan Hurst RN, BSN Signed: 08/01/2020 3:11:24 PM By: Linton Ham MD Entered By: Linton Ham on 07/31/2020 11:40:13 -------------------------------------------------------------------------------- Multi-Disciplinary Care Plan Details [name], [name] 07/31/2020 9:45 [name] M Medical Record Number: [id number] Patient Account Number: [id number] Date of Birth/Sex: Treating RN: 10-Feb-1956 (64 y.o. [name] Primary Care Justeen Hehr: [name] Other Clinician: Referring Jamina Macbeth: Treating Aya Geisel/Extender: [name] in Treatment: 22 Active Inactive Wound/Skin Impairment Nursing Diagnoses: Impaired tissue integrity Goals: Patient/caregiver will verbalize understanding of skin care regimen Date Initiated: 04/27/2020 Target Resolution Date: 09/01/2020 Goal Status: Active Ulcer/skin breakdown will have [name] volume reduction of 50% by week 8 Date Initiated: 02/25/2020 Date Inactivated: 04/27/2020 Target Resolution Date: 04/14/2020 Goal Status: Met Interventions: Provide education on ulcer and skin care Notes: Electronic Signature(s) Signed: 07/31/2020 6:16:40 PM By: Levan Hurst RN, BSN Entered By: Levan Hurst on 07/31/2020 10:54:27 -------------------------------------------------------------------------------- Pain Assessment Details [name], [name] 07/31/2020 9:45 [name] M Medical Record Number: [id number] Patient Account Number: [id number] Date of Birth/Sex: Treating RN: [birth year] (64  y.o. [name]) Levan Hurst Primary Care Abbigaile Rockman: [name] Other Clinician: Referring Majesty Stehlin: Treating  [place]serr[place] Madding/Extender: [name] in Treatment: 22 Active Problems Location of Pain Severity and Description of Pain Patient Has Paino Yes Site Locations Rate the pain. Current Pain Level: 7 Pain Management and Medication Current Pain Management: Electronic Signature(s) Signed: 07/31/2020 4:07:04 PM By: Sandre Kitty Signed: 07/31/2020 6:16:40 PM By: Levan Hurst RN, BSN Entered By: Sandre Kitty on 07/31/2020 10:22:51 -------------------------------------------------------------------------------- Patient/Caregiver Education Details [name], [name] 11/8/2021andnbsp9:45 [place] Record Number: [id number] Patient Account Number: [id number] Date of Birth/Gender: Treating RN: 07-19-1956 (64 y.o. [name] Primary Care Physician: [name] Other Clinician: Referring Physician: Treating Physician/Extender: [name] in Treatment: 22 Education Assessment Education Provided To: Patient Education Topics Provided Wound/Skin Impairment: Methods: Explain/Verbal Responses: State content correctly [employer]) Signed: 07/31/2020 6:16:40 PM By: Levan Hurst RN, BSN Entered By: Levan Hurst on 07/31/2020 10:54:43 -------------------------------------------------------------------------------- Wound Assessment Details [name], [name] 07/31/2020 9:45 [name] M Medical Record Number: [id number] Patient Account Number: [id number] Date of Birth/Sex: Treating RN: September 22, 1956 (64 y.o. [name] Primary Care Thi Sisemore: [name] Other Clinician: Referring Karla Pavone: Treating Nobuko Gsell/Extender: Bayard Hugger Weeks in Treatment: 22 Wound Status Wound Number: 21 Primary Venous Leg Ulcer Etiology: Wound Location: Left, Distal Lower Leg Wound Open Wounding Event: Gradually Appeared Status: Date Acquired:  11/22/2019 Comorbid Anemia, Sleep Apnea, Congestive Heart Failure, Hypertension, Weeks Of Treatment: 22 History: Peripheral Venous Disease, Type II Diabetes, Osteoarthritis, Clustered Wound: No Neuropathy Wound Measurements Length: (cm) 17.2 Width: (cm) 16.5 Depth: (cm) 0.2 Area: (cm) 222.896 Volume: (cm) 44.579 % Reduction in Area: -41.9% % Reduction in Volume: 52.7% Epithelialization: Small (1-33%) Tunneling: No Undermining: No Wound Description Classification: Full Thickness Without Exposed Support Structures Wound Margin: Distinct, outline attached Exudate Amount: Medium Exudate Type: Serosanguineous Exudate Color: red, brown Foul Odor After Cleansing: No Slough/Fibrino Yes Wound Bed Granulation Amount: Medium (34-66%) Exposed Structure Granulation Quality: Red Fascia Exposed: No Necrotic Amount: Medium (34-66%) Fat Layer (Subcutaneous Tissue) Exposed: Yes Necrotic Quality: Adherent Slough Tendon Exposed: No Muscle Exposed: No Joint Exposed: No Bone Exposed: No Treatment Notes Wound #21 (Left, Distal Lower Leg) 1. Cleanse With Wound Cleanser Soap and water 2. Periwound Care Barrier cream Moisturizing lotion 3. Primary Dressing Applied Calcium Alginate Ag 4. Secondary Dressing ABD Pad Dry Gauze Kerramax/Xtrasorb 6. Support Layer Applied 4 layer compression wrap Notes netting. Electronic Signature(s) Signed: 07/31/2020 6:16:40 PM By: Levan Hurst RN, BSN Entered By: Levan Hurst on 07/31/2020 10:59:03 -------------------------------------------------------------------------------- Wound Assessment Details [name], [name] 07/31/2020 9:45 [name] M Medical Record Number: [id number] Patient Account Number: [id number] Date of Birth/Sex: Treating RN: [birth year] (64 y.o. [name] Primary Care Yani Lal: [name] Other Clinician: Referring Janani Chamber: Treating Kelon Easom/Extender: Bayard Hugger Weeks  in Treatment: 22 Wound Status Wound Number: 22 Primary Venous Leg Ulcer Etiology: Wound Location: Right, Medial Lower Leg Wound Open Wounding Event: Gradually Appeared Status: Date Acquired: 12/23/2019 Comorbid Anemia, Sleep Apnea, Congestive Heart Failure, Hypertension, Weeks Of Treatment: 22 History: Peripheral Venous Disease, Type II Diabetes, Osteoarthritis, Clustered Wound: No Neuropathy Wound Measurements Length: (cm) 4.5 Width: (cm) 3.5 Depth: (cm) 0.2 Area: (cm) 12.37 Volume: (cm) 2.474 % Reduction in Area: 45.3% % Reduction in Volume: 45.3% Epithelialization: Small (1-33%) Tunneling: No Undermining: No Wound Description Classification: Full Thickness Without Exposed Support Structures Wound Margin: Flat and Intact Exudate  Amount: Large Exudate Type: Sanguinous Exudate Color: red Foul Odor After Cleansing: No Slough/Fibrino Yes Wound Bed Granulation Amount: Medium (34-66%) Exposed Structure Granulation Quality: Red, Pink Fascia Exposed: No Necrotic Amount: Medium (34-66%) Fat Layer (Subcutaneous Tissue) Exposed: Yes Necrotic Quality: Adherent Slough Tendon Exposed: No Muscle Exposed: No Joint Exposed: No Bone Exposed: No Treatment Notes Wound #22 (Right, Medial Lower Leg) 1. Cleanse With Wound Cleanser Soap and water 2. Periwound Care Barrier cream Moisturizing lotion 3. Primary Dressing Applied Calcium Alginate Ag 4. Secondary Dressing ABD Pad Dry Gauze Kerramax/Xtrasorb 6. Support Layer Applied 4 layer compression wrap Notes netting. Electronic Signature(s) Signed: 07/31/2020 6:16:40 PM By: Levan Hurst RN, BSN Entered By: Levan Hurst on 07/31/2020 10:59:03 -------------------------------------------------------------------------------- Vitals Details [name], [name] 07/31/2020 9:45 [name] M Medical Record Number: [id number] Patient Account Number: [id number] Date of Birth/Sex: Treating RN: 08/19/1956 (64 y.o.  [name] Primary Care Zitlaly Malson: [name] Other Clinician: Referring Eura Mccauslin: Treating Mai Longnecker/Extender: [name] in Treatment: 22 Vital Signs Time Taken: 10:22 Temperature (F): 98.8 Height (in): 77 Pulse (bpm): 90 Weight (lbs): 254 Respiratory Rate (breaths/min): 18 Body Mass Index (BMI): 30.1 Blood Pressure (mmHg): 148/80 Reference Range: 80 - 120 mg / dl Electronic Signature(s) Signed: 07/31/2020 4:07:04 PM By: Sandre Kitty Entered By: Sandre Kitty on 07/31/2020 10:22:37

## 2020-08-01 NOTE — Progress Notes (Signed)
[name], [name] ([id number]) Visit Report for 07/31/2020 HPI Details [name], [name] 07/31/2020 9:45 A M Medical Record Number: [id number] Patient Account Number: [id number] Date of Birth/Sex: Treating RN: 1956-06-13 (64 y.o. [name] Primary Care Provider: [name] Other Clinician: Referring Provider: Treating Provider/Extender: [name] in Treatment: 22 History of Present Illness HPI Description: 07/07/15; this is a patient who is in hospital on 8/2 through 8/4. He had cellulitis and abscess of predominantly I think the left leg. He received IV antibiotics. Plain x-ray showed no osteomyelitis. An MRI of the left leg did not show osteomyelitis. Cultures showed no predominant organism. His hemoglobin A1c was 9.8. He has a history of venous stasis also peripheral vascular disease. He was discharged to [place] home. He really has extensive ulcerations on the left lateral leg including a major wound that communicates both posteriorly and superiorly w. When drainage coming out of 2 smaller areas. He has a smaller wound on the posterior medial left leg. He has more predominantly venous insufficiency wounds on predominantly the right medial leg above the ankle the right foot into sections. He has recently been put on Augmentin at the nursing home. Previous ABIs/arterial evaluation showed triphasic waves diffusely he does not have a major ischemic issue a left lower extremity venous duplex also exam showed no evidence of a DVT on 6/21 07/21/15; the patient arrives with really no major change. Culture I did last time was negative. He has not had a follow-up MRI I ordered. The wounds are macerated covered with a thick gelatinous surface slough. There is necrotic subcutaneous tissue 08/04/15; the patient arrives with a considerable improvement in the majority of his wound area. The area on the left leg now has what looks to  be a granulated base. Most of the wounds on the left leg required an aggressive surgical debridement to remove nonviable fibrinous eschar and subcutaneous tissue however after debridement most of this looks better. Although I had asked for repeat MRI of the left leg when he first came in here with grossly purulent material coming out of his wounds, it does not appear that this is been done and nor do I actually feel that strongly about it right now. He has severe surrounding venous insufficiency and inflammation. I don't believe he has significant PAD 08/25/15. In general there is still a considerable wound area here but with still extensive service adherent slough. The patient will not allow mechanical debridement due to pain. He did not tolerate Medihoney therefore we are left with Santyl for now. She would appear that he has severe surrounding venous stasis. There is no evidence of the infection that may have had something to do with the pathogenesis of these wounds 09/29/15; patient still has substantial wound area on the left leg with a cluster of several wounds on the lateral leg confluently on the left leg posteriorly and then a substantial wound on the medial leg. On the right leg a substantial wound medially and a small area on the right lateral foot. All of these underwent a substantial surgical debridement with curettes which she tolerated better than he has in the past. This started as a complex cellulitis in the face of chronic venous insufficiency and inflammation. 10/13/15; substantial cluster of wounds on the lateral aspect of his left leg confluently around to the other side. Extensive surgical debridement to remove for redness surface slough nonviable subcutaneous tissue. This is not an improvement.  Also substantial wound on the medial right leg which is largely unchanged. The etiology of this was felt to be a complex cellulitis in the summer of 2016 in the face of chronic venous  insufficiency and inflammation. The patient states that the wound on the right medial leg has been there for years off and on. 10/20/15; we are able to start [facility] to these extensive wound areas left greater than right on Tuesday after I spoke to the wound care nurse at the facility. He arrives here today for extensive surgical debridement for the wounds on the lateral aspect of his left leg posterior left leg, this is almost circumferential. He has a large wound on the medial aspect of his right leg although he finds this too painful for debridement 10/27/15; I continue to bring this patient back for frequent debridement/weekly debridement severe. We have been using Hydrofera Blue. Unfortunately this has not really had any improvement. The debridement surgery difficult and painful for the patient 11/23/15; this patient spent a complex hospitalization admitted with acute kidney injury, anemia, cellulitis of the lower extremity, he was felt to have sepsis pathophysiology although his blood cultures were negative. He had plain x-rays of both legs that were negative for osseous abnormalities. He was seen by infectious disease and placed on a workup for vasculitis that was negative including a biopsy 4 all were consistent with stasis dermatitis. Vital broad- spectrum antibiotics he continued to spike fevers. Urine and chest x-ray were negative Dopplers on admission ruled out a DVT All antibiotics were stopped on . 2/17. Since his return to [facility] skilled nursing facility I believe they have been using Xeroform 12/07/15 the patient arrives today with the area on his right medial leg actually looking quite stable. No debridement. The rest of his extensive wounds on the left lateral left posterior extending into the left medial leg all required extensive debridement. I think this is probably going to need to and up in the hands of plastic surgery. We'll attempt to change him back to [facility].  Arrange consultation with plastic surgery at [facility] for this almost circumferential wound on the left side 12/21/15 right leg covered in surface slough. This was debridement. Left leg extensive wounds all carefully examined. This is almost circumferential on the left side especially on the posterior calf. No debridement is necessary. 01/04/16 the patient has been to [facility] plastic surgery. Unfortunately it doesn't look like they had any of the prior workup on this patient. They're in the middle of vascular workup. It sounds as though they're applying Hydrofera Blue at the nursing home. 01/22/16; the patient has been back to see [facility]. There apparently making plans to possibly do skin grafts over his large venous insufficiency ulcerations. The patient tells me that he goes to hematology in [facility] and variably uses the term platelets red cells and the description of his problem. 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 I do not see any major difference in the wound on the right medial leg and circumferentially across the left medial to left lateral leg. Did not attempt to debride these today. Readmission: 05/05/18 on evaluation today patient presents for reevaluation here in our clinic although I have previously seen him in the skilled nursing facility over the past year and a half when I was working in the skilled nursing facility realm instead of covering in the clinic. With that being said during that time we had initiated  most recently dressings with Hydrofera Blue Dressing along with the [employer] which had done very well for him. Much better than the Kerlex Coban wraps. With that being said the patient had been doing fairly well and was making progress when I last saw him. Upon evaluation today it appears that the wounds are actually a little bit worse than when I last saw him although definitely not dramatically so. There does not appear  to be any evidence of infection which is good news. With that being said he has been tolerating the wraps without complication his left hip still gives him a lot of trouble which is his main issue as far as movement is concerned. Unbeknownst to me he has not had venous studies that I can find. He previously told me he had there were arterial studies noted back from 04/27/15 which showed that he had try phasic blood flow throughout and again his test appear to be completely normal as performed by Dr. Creig Hines. With that being said I cannot find where he had venous studies. The patient still states he thinks he did these definitely had no venous intervention at this point. Upon evaluation today it does not appear to me that the patient has any evidence of cellulitis. He does have some chronic venous insufficiency which I think has led to stasis dermatitis but again this is not appearing to be infected at this point. No fevers, chills, nausea, or vomiting noted at this time. READMISSION 06/30/2018 This is a now 64 year old man we have had in this clinic at multiple times in the past. Most recently he came in August and was seen by [facility]. It is not clear why he did not come back we asked him really did not get a straight answer. He is at [facility] skilled facility he is a man who has severe chronic venous insufficiency with lymphedema and has had severe wounds on his left greater than right leg. We had him here in 2016 and 17 ultimately referred him to [facility]. He had arterial studies and venous studies done at [facility] although I am not able to access these records I see they are actually done. He also had multiple biopsies that were negative for malignancy showing changes compatible with chronic inflammation. As I understand things in [place] they are using Iodoflex and Unna boots. I am not really sure how they are getting enough Iodoflex for the large wound area especially on his left  calf. Nevertheless the wounds look better than when I saw this in 2017. There were plans for him to have plastic surgery in 2018 and I think he was actually prepped for surgery however he was ultimately denied because the patient was an active smoker. The patient is not systemically unwell. The wounds are painful however given the size especially on the left this is not surprising. ABIs in this clinic were 0.78 on the left and 0.91 on the right 07/13/2018; this is a patient with bilateral severe venous ulcers with secondary lymphedema. The wounds are on the right medial calf and a substantial part of the left posterior calf and some involvement medially and laterally on the left. We changed him to silver alginate under Unna boot's last time. The wounds actually look fairly satisfactory today. 08/14/2018; this is a patient with severe bilateral venous stasis ulcers with secondary lymphedema left greater than right he is at [facility] using silver alginate under Unna boot's I do not think there  is much change on this visit versus last time I saw him a month ago Readmission: 11/04/18 patient presents today for follow-up concerning his bilateral lower extremity ulcers. His a right medial ankle ulcer and a left leg that has ulcers over a large proportion of the surface area between the ankle and knee. Unfortunately this does cause some discomfort for the patient although it doesn't seem to be as uncomfortable as it has been in the past. He is seen today for evaluation after a referral by [place] back to the clinic. Unfortunately has a lot of Slough noted on the surface of his wounds. He's been treated currently it sounds like with Dakin's soaked gauze and they have not been performing the Unna Boot wrap that was previously recommended. I'm not exactly sure what the reason for the change was. He does have less slough on the surface of the wound but again this is something that is often a constant issue for  him. Again he's had these wounds for many years. I've known him for two years at least during that time when I was taking care of them in the facility he is Artie had the wounds for several years prior. READMISSION 02/25/2020 Josepha Pigg is a now 64 year old man. He has been in our clinic several times before most extensively in October 2016 through May 2017. At that time he had absolutely substantial bilateral lower extremity wounds secondary to chronic venous insufficiency and lymphedema. We ultimately referred him to [facility] he had a series of biopsies only showed suggestion of wound secondary to chronic venous insufficiency. He had a less extensive stay in our clinic in 2019 and then a single visit in February 2020. He is a resident at [facility] skilled facility. I think he has had intermittently had in facility wound care. He returns today. They are using silver alginate on the wounds although I am not sure what type of compression. Previously he has favored Unna boots. He is not felt to have an arterial issue. Past medical history; lymphedema and chronic venous insufficiency, diabetes mellitus, hypertension, congestive heart failure We did not do arterial studies today 04/27/2020 on evaluation patient appears to be doing really about the same as when I have known him and seen him in the past. He does have wounds on the right and left legs at this point. Fortunately there is no signs of active infection at this time. 9/2; 1 month follow-up. This is a man with severe chronic venous insufficiency and lymphedema. When I first met him he had horrible circumferential bilateral lower extremity ulcers. We have been using silver alginate up until early last month when it was changed to [facility] and Unna boot's more or less says maintenance dressings. Since the last time he was here the facility where he resides [facility] called to report they could no longer do Unna boot so he is apparently only  been receiving kerlix Coban the left leg is certainly a lot worse with almost circumferential large wounds especially lateral but now medial and posterior as well. He has a standard same looking wound on the right medial lower leg. 10/1; absolutely no improvement here. In fact I do not think anything much is changed. The substantial area on his left lateral and left posterior calf is still open may be slightly larger. He has a more modest wound on the right medial calf. None of these look any different. He comes in with a kerlix Coban wrap this will simply  not be adequate. He has too much edema in this leg He also is complaining a lot of pain in his left heel area. 11/8; potential wounds on the left lateral and posterior calf and a smaller oval wound in the right medial. We have been using Hydrofera Blue under 4-layer compression. He was using Unna boots still 2 months ago the facility called to say they can no longer place these so we have been using 4-layer compression. The surface area of the area on the left is so large there is very few alternatives to what we can use on this either silver alginate or Hydrofera Blue. He was complaining of pain on the left heel last time I asked for an x-ray this was apparently done although we do not have the result. He is not complaining of pain today. Electronic Signature(s) Signed: 08/01/2020 3:11:24 PM By: Linton Ham MD Entered By: Linton Ham on 07/31/2020 11:41:26 -------------------------------------------------------------------------------- Physical Exam Details [name], [name] 07/31/2020 9:45 A M Medical Record Number: [id number] Patient Account Number: [id number] Date of Birth/Sex: Treating RN: [birth year] (64 y.o. [name] Primary Care Provider: [name] Other Clinician: Referring Provider: Treating Provider/Extender: [name] in Treatment:  22 Constitutional Sitting or standing Blood Pressure is within target range for patient.. Pulse regular and within target range for patient.Marland Kitchen Respirations regular, non-labored and within target range.Marland Kitchen Appears in no distress. Cardiovascular Pedal pulses are palpable. Edema control is good. Notes Wound exam; this is a chronic wound on the left lateral greater than right medial calf. I do not know that we have made a lot of progress here. The granulation tissue here looks healthy. Not a lot of epithelialization however. The smaller areas on the right medial although I am not sure we have made a lot of difference here either. Electronic Signature(s) Signed: 08/01/2020 3:11:24 PM By: Linton Ham MD Entered By: Linton Ham on 07/31/2020 11:42:29 -------------------------------------------------------------------------------- Physician Orders Details [name], [name] 07/31/2020 9:45 A M Medical Record Number: [id number] Patient Account Number: [id number] Date of Birth/Sex: Treating RN: Aug 12, 1956 (64 y.o. [name] Primary Care Provider: [name] Other Clinician: Referring Provider: Treating Provider/Extender: [name] in Treatment: 22 Verbal / Phone Orders: No Diagnosis Coding ICD-10 Coding Code Description [phone] Chronic venous hypertension (idiopathic) with ulcer and inflammation of bilateral lower extremity I89.0 Lymphedema, not elsewhere classified L97.222 Non-pressure chronic ulcer of left calf with fat layer exposed L97.212 Non-pressure chronic ulcer of right calf with fat layer exposed Follow-up Appointments ppointment in: - 6 weeks Return A Dressing Change Frequency Wound #21 Left,Distal Lower Leg Change dressing three times week. Wound #22 Right,Medial Lower Leg Change dressing three times week. Skin Barriers/Peri-Wound Care Barrier cream Moisturizing lotion Wound Cleansing Clean wound  with Wound Cleanser - or normal saline Primary Wound Dressing Wound #21 Left,Distal Lower Leg Calcium Alginate with Silver Wound #22 Right,Medial Lower Leg Calcium Alginate with Silver Secondary Dressing Wound #21 Left,Distal Lower Leg ABD pad Zetuvit or Kerramax - or equivalent super absorbent pad Wound #22 Right,Medial Lower Leg ABD pad Zetuvit or Kerramax - or equivalent super absorbent pad Edema Control 4 layer compression - Bilateral Avoid standing for long periods of time Elevate legs to the level of the heart or above for 30 minutes daily and/or when sitting, a frequency of: - throughout the day Electronic Signature(s) Signed: 07/31/2020 6:16:40 PM By: Levan Hurst RN, BSN Signed: 08/01/2020 3:11:24  PM By: Linton Ham MD Entered By: Levan Hurst on 07/31/2020 10:59:45 -------------------------------------------------------------------------------- Problem List Details [name], [name] 07/31/2020 9:45 A M Medical Record Number: [id number] Patient Account Number: [id number] Date of Birth/Sex: Treating RN: 1956-08-31 (64 y.o. [name] Primary Care Provider: [name] Other Clinician: Referring Provider: Treating Provider/Extender: [name] in Treatment: 22 Active Problems ICD-10 Encounter Code Description Active Date MDM Diagnosis I87.333 Chronic venous hypertension (idiopathic) with ulcer and inflammation of 02/25/2020 No Yes bilateral lower extremity I89.0 Lymphedema, not elsewhere classified 02/25/2020 No Yes L97.222 Non-pressure chronic ulcer of left calf with fat layer exposed 02/25/2020 No Yes L97.212 Non-pressure chronic ulcer of right calf with fat layer exposed 02/25/2020 No Yes Inactive Problems Resolved Problems Electronic Signature(s) Signed: 08/01/2020 3:11:24 PM By: Linton Ham MD Entered By: Linton Ham on 07/31/2020  11:40:07 -------------------------------------------------------------------------------- Progress Note Details [name], [name] 07/31/2020 9:45 A M Medical Record Number: [id number] Patient Account Number: [id number] Date of Birth/Sex: Treating RN: [birth year] (64 y.o. [name] Primary Care Provider: [name] Other Clinician: Referring Provider: Treating Provider/Extender: [name] in Treatment: 22 Subjective History of Present Illness (HPI) 07/07/15; this is a patient who is in hospital on 8/2 through 8/4. He had cellulitis and abscess of predominantly I think the left leg. He received IV antibiotics. Plain x-ray showed no osteomyelitis. An MRI of the left leg did not show osteomyelitis. Cultures showed no predominant organism. His hemoglobin A1c was 9.8. He has a history of venous stasis also peripheral vascular disease. He was discharged to [place] home. He really has extensive ulcerations on the left lateral leg including a major wound that communicates both posteriorly and superiorly w. When drainage coming out of 2 smaller areas. He has a smaller wound on the posterior medial left leg. He has more predominantly venous insufficiency wounds on predominantly the right medial leg above the ankle the right foot into sections. He has recently been put on Augmentin at the nursing home. Previous ABIs/arterial evaluation showed triphasic waves diffusely he does not have a major ischemic issue a left lower extremity venous duplex also exam showed no evidence of a DVT on 6/21 07/21/15; the patient arrives with really no major change. Culture I did last time was negative. He has not had a follow-up MRI I ordered. The wounds are macerated covered with a thick gelatinous surface slough. There is necrotic subcutaneous tissue 08/04/15; the patient arrives with a considerable improvement in the majority of his wound  area. The area on the left leg now has what looks to be a granulated base. Most of the wounds on the left leg required an aggressive surgical debridement to remove nonviable fibrinous eschar and subcutaneous tissue however after debridement most of this looks better. Although I had asked for repeat MRI of the left leg when he first came in here with grossly purulent material coming out of his wounds, it does not appear that this is been done and nor do I actually feel that strongly about it right now. He has severe surrounding venous insufficiency and inflammation. I don't believe he has significant PAD 08/25/15. In general there is still a considerable wound area here but with still extensive service adherent slough. The patient will not allow mechanical debridement due to pain. He did not tolerate Medihoney therefore we are left with Santyl for now. She would appear that he has severe surrounding  venous stasis. There is no evidence of the infection that may have had something to do with the pathogenesis of these wounds 09/29/15; patient still has substantial wound area on the left leg with a cluster of several wounds on the lateral leg confluently on the left leg posteriorly and then a substantial wound on the medial leg. On the right leg a substantial wound medially and a small area on the right lateral foot. All of these underwent a substantial surgical debridement with curettes which she tolerated better than he has in the past. This started as a complex cellulitis in the face of chronic venous insufficiency and inflammation. 10/13/15; substantial cluster of wounds on the lateral aspect of his left leg confluently around to the other side. Extensive surgical debridement to remove for redness surface slough nonviable subcutaneous tissue. This is not an improvement. Also substantial wound on the medial right leg which is largely unchanged. The etiology of this was felt to be a complex cellulitis in the  summer of 2016 in the face of chronic venous insufficiency and inflammation. The patient states that the wound on the right medial leg has been there for years off and on. 10/20/15; we are able to start [facility] to these extensive wound areas left greater than right on Tuesday after I spoke to the wound care nurse at the facility. He arrives here today for extensive surgical debridement for the wounds on the lateral aspect of his left leg posterior left leg, this is almost circumferential. He has a large wound on the medial aspect of his right leg although he finds this too painful for debridement 10/27/15; I continue to bring this patient back for frequent debridement/weekly debridement severe. We have been using Hydrofera Blue. Unfortunately this has not really had any improvement. The debridement surgery difficult and painful for the patient 11/23/15; this patient spent a complex hospitalization admitted with acute kidney injury, anemia, cellulitis of the lower extremity, he was felt to have sepsis pathophysiology although his blood cultures were negative. He had plain x-rays of both legs that were negative for osseous abnormalities. He was seen by infectious disease and placed on a workup for vasculitis that was negative including a biopsy o4 all were consistent with stasis dermatitis. Vital broad-spectrum antibiotics he continued to spike fevers. Urine and chest x-ray were negative Dopplers on admission ruled out a DVT All antibiotics were stopped on 2/17. . Since his return to [facility] skilled nursing facility I believe they have been using Xeroform 12/07/15 the patient arrives today with the area on his right medial leg actually looking quite stable. No debridement. The rest of his extensive wounds on the left lateral left posterior extending into the left medial leg all required extensive debridement. I think this is probably going to need to and up in the hands of plastic surgery. We'll  attempt to change him back to [facility]. Arrange consultation with plastic surgery at [facility] for this almost circumferential wound on the left side 12/21/15 right leg covered in surface slough. This was debridement. Left leg extensive wounds all carefully examined. This is almost circumferential on the left side especially on the posterior calf. No debridement is necessary. 01/04/16 the patient has been to [facility] plastic surgery. Unfortunately it doesn't look like they had any of the prior workup on this patient. They're in the middle of vascular workup. It sounds as though they're applying Hydrofera Blue at the nursing home. 01/22/16; the patient has been back to see  [facility]. There apparently making plans to possibly do skin grafts over his large venous insufficiency ulcerations. The patient tells me that he goes to hematology in [facility] and variably uses the term platelets red cells and the description of his problem. 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 I do not see any major difference in the wound on the right medial leg and circumferentially across the left medial to left lateral leg. Did not attempt to debride these today. Readmission: 05/05/18 on evaluation today patient presents for reevaluation here in our clinic although I have previously seen him in the skilled nursing facility over the past year and a half when I was working in the skilled nursing facility realm instead of covering in the clinic. With that being said during that time we had initiated most recently dressings with Hydrofera Blue Dressing along with the [employer] which had done very well for him. Much better than the Kerlex Coban wraps. With that being said the patient had been doing fairly well and was making progress when I last saw him. Upon evaluation today it appears that the wounds are actually a little bit worse than when I last saw him although  definitely not dramatically so. There does not appear to be any evidence of infection which is good news. With that being said he has been tolerating the wraps without complication his left hip still gives him a lot of trouble which is his main issue as far as movement is concerned. Unbeknownst to me he has not had venous studies that I can find. He previously told me he had there were arterial studies noted back from 04/27/15 which showed that he had try phasic blood flow throughout and again his test appear to be completely normal as performed by Dr. Creig Hines. With that being said I cannot find where he had venous studies. The patient still states he thinks he did these definitely had no venous intervention at this point. Upon evaluation today it does not appear to me that the patient has any evidence of cellulitis. He does have some chronic venous insufficiency which I think has led to stasis dermatitis but again this is not appearing to be infected at this point. No fevers, chills, nausea, or vomiting noted at this time. READMISSION 06/30/2018 This is a now 64 year old man we have had in this clinic at multiple times in the past. Most recently he came in August and was seen by [facility]. It is not clear why he did not come back we asked him really did not get a straight answer. He is at [facility] skilled facility he is a man who has severe chronic venous insufficiency with lymphedema and has had severe wounds on his left greater than right leg. We had him here in 2016 and 17 ultimately referred him to [facility]. He had arterial studies and venous studies done at [facility] although I am not able to access these records I see they are actually done. He also had multiple biopsies that were negative for malignancy showing changes compatible with chronic inflammation. As I understand things in [place] they are using Iodoflex and Unna boots. I am not really sure how they are getting enough  Iodoflex for the large wound area especially on his left calf. Nevertheless the wounds look better than when I saw this in 2017. There were plans for him to have plastic surgery in 2018 and I think  he was actually prepped for surgery however he was ultimately denied because the patient was an active smoker. The patient is not systemically unwell. The wounds are painful however given the size especially on the left this is not surprising. ABIs in this clinic were 0.78 on the left and 0.91 on the right 07/13/2018; this is a patient with bilateral severe venous ulcers with secondary lymphedema. The wounds are on the right medial calf and a substantial part of the left posterior calf and some involvement medially and laterally on the left. We changed him to silver alginate under Unna boot's last time. The wounds actually look fairly satisfactory today. 08/14/2018; this is a patient with severe bilateral venous stasis ulcers with secondary lymphedema left greater than right he is at [facility] using silver alginate under Unna boot's I do not think there is much change on this visit versus last time I saw him a month ago Readmission: 11/04/18 patient presents today for follow-up concerning his bilateral lower extremity ulcers. His a right medial ankle ulcer and a left leg that has ulcers over a large proportion of the surface area between the ankle and knee. Unfortunately this does cause some discomfort for the patient although it doesn't seem to be as uncomfortable as it has been in the past. He is seen today for evaluation after a referral by [place] back to the clinic. Unfortunately has a lot of Slough noted on the surface of his wounds. He's been treated currently it sounds like with Dakin's soaked gauze and they have not been performing the Unna Boot wrap that was previously recommended. I'm not exactly sure what the reason for the change was. He does have less slough on the surface of the wound  but again this is something that is often a constant issue for him. Again he's had these wounds for many years. I've known him for two years at least during that time when I was taking care of them in the facility he is Artie had the wounds for several years prior. READMISSION 02/25/2020 Josepha Pigg is a now 64 year old man. He has been in our clinic several times before most extensively in October 2016 through May 2017. At that time he had absolutely substantial bilateral lower extremity wounds secondary to chronic venous insufficiency and lymphedema. We ultimately referred him to [facility] he had a series of biopsies only showed suggestion of wound secondary to chronic venous insufficiency. He had a less extensive stay in our clinic in 2019 and then a single visit in February 2020. He is a resident at [facility] skilled facility. I think he has had intermittently had in facility wound care. He returns today. They are using silver alginate on the wounds although I am not sure what type of compression. Previously he has favored Unna boots. He is not felt to have an arterial issue. Past medical history; lymphedema and chronic venous insufficiency, diabetes mellitus, hypertension, congestive heart failure We did not do arterial studies today 04/27/2020 on evaluation patient appears to be doing really about the same as when I have known him and seen him in the past. He does have wounds on the right and left legs at this point. Fortunately there is no signs of active infection at this time. 9/2; 1 month follow-up. This is a man with severe chronic venous insufficiency and lymphedema. When I first met him he had horrible circumferential bilateral lower extremity ulcers. We have been using silver alginate up until early last month when  it was changed to [facility] and Unna boot's more or less says maintenance dressings. Since the last time he was here the facility where he resides [facility] called to  report they could no longer do Unna boot so he is apparently only been receiving kerlix Coban the left leg is certainly a lot worse with almost circumferential large wounds especially lateral but now medial and posterior as well. He has a standard same looking wound on the right medial lower leg. 10/1; absolutely no improvement here. In fact I do not think anything much is changed. The substantial area on his left lateral and left posterior calf is still open may be slightly larger. He has a more modest wound on the right medial calf. None of these look any different. He comes in with a kerlix Coban wrap this will simply not be adequate. He has too much edema in this leg He also is complaining a lot of pain in his left heel area. 11/8; potential wounds on the left lateral and posterior calf and a smaller oval wound in the right medial. We have been using Hydrofera Blue under 4-layer compression. He was using Unna boots still 2 months ago the facility called to say they can no longer place these so we have been using 4-layer compression. The surface area of the area on the left is so large there is very few alternatives to what we can use on this either silver alginate or Hydrofera Blue. He was complaining of pain on the left heel last time I asked for an x-ray this was apparently done although we do not have the result. He is not complaining of pain today. Objective Constitutional Sitting or standing Blood Pressure is within target range for patient.. Pulse regular and within target range for patient.Marland Kitchen Respirations regular, non-labored and within target range.Marland Kitchen Appears in no distress. Vitals Time Taken: 10:22 AM, Height: 77 in, Weight: 254 lbs, BMI: 30.1, Temperature: 98.8 F, Pulse: 90 bpm, Respiratory Rate: 18 breaths/min, Blood Pressure: 148/80 mmHg. Cardiovascular Pedal pulses are palpable. Edema control is good. General Notes: Wound exam; this is a chronic wound on the left lateral greater  than right medial calf. I do not know that we have made a lot of progress here. The granulation tissue here looks healthy. Not a lot of epithelialization however. The smaller areas on the right medial although I am not sure we have made a lot of difference here either. Integumentary (Hair, Skin) Wound #21 status is Open. Original cause of wound was Gradually Appeared. The wound is located on the Left,Distal Lower Leg. The wound measures 17.2cm length x 16.5cm width x 0.2cm depth; 222.896cm^2 area and 44.579cm^3 volume. There is Fat Layer (Subcutaneous Tissue) exposed. There is no tunneling or undermining noted. There is a medium amount of serosanguineous drainage noted. The wound margin is distinct with the outline attached to the wound base. There is medium (34-66%) red granulation within the wound bed. There is a medium (34-66%) amount of necrotic tissue within the wound bed including Adherent Slough. Wound #22 status is Open. Original cause of wound was Gradually Appeared. The wound is located on the Right,Medial Lower Leg. The wound measures 4.5cm length x 3.5cm width x 0.2cm depth; 12.37cm^2 area and 2.474cm^3 volume. There is Fat Layer (Subcutaneous Tissue) exposed. There is no tunneling or undermining noted. There is a large amount of sanguinous drainage noted. The wound margin is flat and intact. There is medium (34-66%) red, pink granulation within the wound  bed. There is a medium (34-66%) amount of necrotic tissue within the wound bed including Adherent Slough. Assessment Active Problems ICD-10 Chronic venous hypertension (idiopathic) with ulcer and inflammation of bilateral lower extremity Lymphedema, not elsewhere classified Non-pressure chronic ulcer of left calf with fat layer exposed Non-pressure chronic ulcer of right calf with fat layer exposed Procedures Wound #21 Pre-procedure diagnosis of Wound #21 is a Venous Leg Ulcer located on the Left,Distal Lower Leg . There was a Four  Layer Compression Therapy Procedure by Levan Hurst, RN. Post procedure Diagnosis Wound #21: Same as Pre-Procedure Wound #22 Pre-procedure diagnosis of Wound #22 is a Venous Leg Ulcer located on the Right,Medial Lower Leg . There was a Four Layer Compression Therapy Procedure by Levan Hurst, RN. Post procedure Diagnosis Wound #22: Same as Pre-Procedure Plan Follow-up Appointments: Return Appointment in: - 6 weeks Dressing Change Frequency: Wound #21 Left,Distal Lower Leg: Change dressing three times week. Wound #22 Right,Medial Lower Leg: Change dressing three times week. Skin Barriers/Peri-Wound Care: Barrier cream Moisturizing lotion Wound Cleansing: Clean wound with Wound Cleanser - or normal saline Primary Wound Dressing: Wound #21 Left,Distal Lower Leg: Calcium Alginate with Silver Wound #22 Right,Medial Lower Leg: Calcium Alginate with Silver Secondary Dressing: Wound #21 Left,Distal Lower Leg: ABD pad Zetuvit or Kerramax - or equivalent super absorbent pad Wound #22 Right,Medial Lower Leg: ABD pad Zetuvit or Kerramax - or equivalent super absorbent pad Edema Control: 4 layer compression - Bilateral Avoid standing for long periods of time Elevate legs to the level of the heart or above for 30 minutes daily and/or when sitting, a frequency of: - throughout the day 1. I have changed him back to silver alginate. The Hydrofera Blue was expensive and we are not making any additional progress. Continue 4 layer compression bilateral 2. I do not think there is anything additionally were missing here. These wounds were biopsied repetitively in the past while he was at [facility]. Engineer, maintenance) Signed: 08/01/2020 3:11:24 PM By: Linton Ham MD Entered By: Linton Ham on 07/31/2020 11:43:17 -------------------------------------------------------------------------------- SuperBill Details [name], [name] 07/31/2020 Medical Record  Number: [id number] Patient Account Number: [id number] Date of Birth/Sex: Treating RN: Jul 01, 1956 (64 y.o. [name] Primary Care Provider: [name] Other Clinician: Referring Provider: Treating Provider/Extender: [name] in Treatment: 22 Diagnosis Coding ICD-10 Codes Code Description [phone] Chronic venous hypertension (idiopathic) with ulcer and inflammation of bilateral lower extremity I89.0 Lymphedema, not elsewhere classified L97.222 Non-pressure chronic ulcer of left calf with fat layer exposed L97.212 Non-pressure chronic ulcer of right calf with fat layer exposed Facility Procedures CPT4: Code [id number] Description: 81 BILATERAL: Application of multi-layer venous compression system; leg (below knee), including ankle and t. Modifier: Quantity: 1 Physician Procedures : CPT4 Code Description Modifier [id number] - WC PHYS LEVEL 3 - EST PT ICD-10 Diagnosis Description I87.333 Chronic venous hypertension (idiopathic) with ulcer and inflammation of bilateral lower extremity L97.222 Non-pressure chronic ulcer of left  calf with fat layer exposed L97.212 Non-pressure chronic ulcer of right calf with fat layer exposed Quantity: 1 Electronic Signature(s) Signed: 08/01/2020 3:11:24 PM By: Linton Ham MD Entered By: Linton Ham on 07/31/2020 11:43:37

## 2020-08-16 ENCOUNTER — Other Ambulatory Visit: Payer: Self-pay

## 2020-08-16 ENCOUNTER — Encounter (HOSPITAL_BASED_OUTPATIENT_CLINIC_OR_DEPARTMENT_OTHER): Payer: Medicare Other | Admitting: Physician Assistant

## 2020-08-16 DIAGNOSIS — E1151 Type 2 diabetes mellitus with diabetic peripheral angiopathy without gangrene: Secondary | ICD-10-CM | POA: Diagnosis not present

## 2020-08-16 NOTE — Progress Notes (Addendum)
[name], [name] ([id number]) Visit Report for 08/16/2020 Chief Complaint Document Details Patient Name: Date of Service: [name] 08/16/2020 1:45 PM Medical Record Number: [id number] Patient Account Number: [id number] Date of Birth/Sex: Treating RN: [birth year] (64 y.o. [name] Primary Care Provider: [name] Other Clinician: Referring Provider: Treating Provider/Extender: [name] in Treatment: 24 Information Obtained from: Patient Chief Complaint Bilateral lower extremity wounds 02/25/2020; patient is back in clinic today for review of wounds on his left posterior greater than right lower extremity wounds Electronic Signature(s) Signed: 08/16/2020 1:56:29 PM By: Worthy Keeler PA-C Entered By: Worthy Keeler on 08/16/2020 13:56:29 -------------------------------------------------------------------------------- HPI Details Patient Name: Date of Service: [name] 08/16/2020 1:45 PM Medical Record Number: [id number] Patient Account Number: [id number] Date of Birth/Sex: Treating RN: Jul 14, 1956 (64 y.o. [name] Primary Care Provider: [name] Other Clinician: Referring Provider: Treating Provider/Extender: [name] in Treatment: 24 History of Present Illness HPI Description: 07/07/15; this is a patient who is in hospital on 8/2 through 8/4. He had cellulitis and abscess of predominantly I think the left leg. He received IV antibiotics. Plain x-ray showed no osteomyelitis. An MRI of the left leg did not show osteomyelitis. Cultures showed no predominant organism. His hemoglobin A1c was 9.8. He has a history of venous stasis also peripheral vascular disease. He was discharged to [facility]. He really has extensive ulcerations on the left lateral leg including a major wound that communicates both posteriorly and superiorly w. When drainage coming out of 2 smaller areas. He  has a smaller wound on the posterior medial left leg. He has more predominantly venous insufficiency wounds on predominantly the right medial leg above the ankle the right foot into sections. He has recently been put on Augmentin at the nursing home. Previous ABIs/arterial evaluation showed triphasic waves diffusely he does not have a major ischemic issue a left lower extremity venous duplex also exam showed no evidence of a DVT on 6/21 07/21/15; the patient arrives with really no major change. Culture I did last time was negative. He has not had a follow-up MRI I ordered. The wounds are macerated covered with a thick gelatinous surface slough. There is necrotic subcutaneous tissue 08/04/15; the patient arrives with a considerable improvement in the majority of his wound area. The area on the left leg now has what looks to be a granulated base. Most of the wounds on the left leg required an aggressive surgical debridement to remove nonviable fibrinous eschar and subcutaneous tissue however after debridement most of this looks better. Although I had asked for repeat MRI of the left leg when he first came in here with grossly purulent material coming out of his wounds, it does not appear that this is been done and nor do I actually feel that strongly about it right now. He has severe surrounding venous insufficiency and inflammation. I don't believe he has significant PAD 08/25/15. In general there is still a considerable wound area here but with still extensive service adherent slough. The patient will not allow mechanical debridement due to pain. He did not tolerate Medihoney therefore we are left with Santyl for now. She would appear that he has severe surrounding venous stasis. There is no evidence of the infection that may have had something to do with the pathogenesis of these wounds 09/29/15; patient still has substantial wound area on the left leg with a cluster of several wounds on  the lateral leg  confluently on the left leg posteriorly and then a substantial wound on the medial leg. On the right leg a substantial wound medially and a small area on the right lateral foot. All of these underwent a substantial surgical debridement with curettes which she tolerated better than he has in the past. This started as a complex cellulitis in the face of chronic venous insufficiency and inflammation. 10/13/15; substantial cluster of wounds on the lateral aspect of his left leg confluently around to the other side. Extensive surgical debridement to remove for redness surface slough nonviable subcutaneous tissue. This is not an improvement. Also substantial wound on the medial right leg which is largely unchanged. The etiology of this was felt to be a complex cellulitis in the summer of 2016 in the face of chronic venous insufficiency and inflammation. The patient states that the wound on the right medial leg has been there for years off and on. 10/20/15; we are able to start [facility] to these extensive wound areas left greater than right on Tuesday after I spoke to the wound care nurse at the facility. He arrives here today for extensive surgical debridement for the wounds on the lateral aspect of his left leg posterior left leg, this is almost circumferential. He has a large wound on the medial aspect of his right leg although he finds this too painful for debridement 10/27/15; I continue to bring this patient back for frequent debridement/weekly debridement severe. We have been using Hydrofera Blue. Unfortunately this has not really had any improvement. The debridement surgery difficult and painful for the patient 11/23/15; this patient spent a complex hospitalization admitted with acute kidney injury, anemia, cellulitis of the lower extremity, he was felt to have sepsis pathophysiology although his blood cultures were negative. He had plain x-rays of both legs that were negative for osseous  abnormalities. He was seen by infectious disease and placed on a workup for vasculitis that was negative including a biopsy 4 all were consistent with stasis dermatitis. Vital broad- spectrum antibiotics he continued to spike fevers. Urine and chest x-ray were negative Dopplers on admission ruled out a DVT All antibiotics were stopped on . 2/17. Since his return to [facility] skilled nursing facility I believe they have been using Xeroform 12/07/15 the patient arrives today with the area on his right medial leg actually looking quite stable. No debridement. The rest of his extensive wounds on the left lateral left posterior extending into the left medial leg all required extensive debridement. I think this is probably going to need to and up in the hands of plastic surgery. We'll attempt to change him back to [facility]. Arrange consultation with plastic surgery at [facility] for this almost circumferential wound on the left side 12/21/15 right leg covered in surface slough. This was debridement. Left leg extensive wounds all carefully examined. This is almost circumferential on the left side especially on the posterior calf. No debridement is necessary. 01/04/16 the patient has been to [facility] plastic surgery. Unfortunately it doesn't look like they had any of the prior workup on this patient. They're in the middle of vascular workup. It sounds as though they're applying Hydrofera Blue at the nursing home. 01/22/16; the patient has been back to see [facility] There apparently making plans to possibly do skin grafts over his large venous insufficiency ulcerations. The patient tells me that he goes to hematology in [facility] and variably uses the term platelets red cells and the description  of his problem. 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 I do not see any major difference in the wound on the right medial leg and circumferentially across the left  medial to left lateral leg. Did not attempt to debride these today. Readmission: 05/05/18 on evaluation today patient presents for reevaluation here in our clinic although I have previously seen him in the skilled nursing facility over the past year and a half when I was working in the skilled nursing facility realm instead of covering in the clinic. With that being said during that time we had initiated most recently dressings with Hydrofera Blue Dressing along with the [employer] which had done very well for him. Much better than the Kerlex Coban wraps. With that being said the patient had been doing fairly well and was making progress when I last saw him. Upon evaluation today it appears that the wounds are actually a little bit worse than when I last saw him although definitely not dramatically so. There does not appear to be any evidence of infection which is good news. With that being said he has been tolerating the wraps without complication his left hip still gives him a lot of trouble which is his main issue as far as movement is concerned. Unbeknownst to me he has not had venous studies that I can find. He previously told me he had there were arterial studies noted back from 04/27/15 which showed that he had try phasic blood flow throughout and again his test appear to be completely normal as performed by Dr. Creig Hines. With that being said I cannot find where he had venous studies. The patient still states he thinks he did these definitely had no venous intervention at this point. Upon evaluation today it does not appear to me that the patient has any evidence of cellulitis. He does have some chronic venous insufficiency which I think has led to stasis dermatitis but again this is not appearing to be infected at this point. No fevers, chills, nausea, or vomiting noted at this time. READMISSION 06/30/2018 This is a now 64 year old man we have had in this clinic at multiple times in the  past. Most recently he came in August and was seen by [facility] stone. It is not clear why he did not come back we asked him really did not get a straight answer. He is at [facility] skilled facility he is a man who has severe chronic venous insufficiency with lymphedema and has had severe wounds on his left greater than right leg. We had him here in 2016 and 17 ultimately referred him to [facility]. He had arterial studies and venous studies done at [facility] although I am not able to access these records I see they are actually done. He also had multiple biopsies that were negative for malignancy showing changes compatible with chronic inflammation. As I understand things in [place] they are using Iodoflex and Unna boots. I am not really sure how they are getting enough Iodoflex for the large wound area especially on his left calf. Nevertheless the wounds look better than when I saw this in 2017. There were plans for him to have plastic surgery in 2018 and I think he was actually prepped for surgery however he was ultimately denied because the patient was an active smoker. The patient is not systemically unwell. The wounds are painful however given the size especially on the left this is not  surprising. ABIs in this clinic were 0.78 on the left and 0.91 on the right 07/13/2018; this is a patient with bilateral severe venous ulcers with secondary lymphedema. The wounds are on the right medial calf and a substantial part of the left posterior calf and some involvement medially and laterally on the left. We changed him to silver alginate under Unna boot's last time. The wounds actually look fairly satisfactory today. 08/14/2018; this is a patient with severe bilateral venous stasis ulcers with secondary lymphedema left greater than right he is at [facility] using silver alginate under Unna boot's I do not think there is much change on this visit versus last time I saw him a month  ago Readmission: 11/04/18 patient presents today for follow-up concerning his bilateral lower extremity ulcers. His a right medial ankle ulcer and a left leg that has ulcers over a large proportion of the surface area between the ankle and knee. Unfortunately this does cause some discomfort for the patient although it doesn't seem to be as uncomfortable as it has been in the past. He is seen today for evaluation after a referral by [place] back to the clinic. Unfortunately has a lot of Slough noted on the surface of his wounds. He's been treated currently it sounds like with Dakin's soaked gauze and they have not been performing the Unna Boot wrap that was previously recommended. I'm not exactly sure what the reason for the change was. He does have less slough on the surface of the wound but again this is something that is often a constant issue for him. Again he's had these wounds for many years. I've known him for two years at least during that time when I was taking care of them in the facility he is Artie had the wounds for several years prior. READMISSION 02/25/2020 [name] is a now 64 year old man. He has been in our clinic several times before most extensively in October 2016 through May 2017. At that time he had absolutely substantial bilateral lower extremity wounds secondary to chronic venous insufficiency and lymphedema. We ultimately referred him to [facility] he had a series of biopsies only showed suggestion of wound secondary to chronic venous insufficiency. He had a less extensive stay in our clinic in 2019 and then a single visit in February 2020. He is a resident at [facility] skilled facility. I think he has had intermittently had in facility wound care. He returns today. They are using silver alginate on the wounds although I am not sure what type of compression. Previously he has favored Unna boots. He is not felt to have an arterial issue. Past medical history; lymphedema and  chronic venous insufficiency, diabetes mellitus, hypertension, congestive heart failure We did not do arterial studies today 04/27/2020 on evaluation patient appears to be doing really about the same as when I have known him and seen him in the past. He does have wounds on the right and left legs at this point. Fortunately there is no signs of active infection at this time. 9/2; 1 month follow-up. This is a man with severe chronic venous insufficiency and lymphedema. When I first met him he had horrible circumferential bilateral lower extremity ulcers. We have been using silver alginate up until early last month when it was changed to [facility] and Unna boot's more or less says maintenance dressings. Since the last time he was here the facility where he resides [facility] called to report they could no longer do [employer]  boot so he is apparently only been receiving kerlix Coban the left leg is certainly a lot worse with almost circumferential large wounds especially lateral but now medial and posterior as well. He has a standard same looking wound on the right medial lower leg. 10/1; absolutely no improvement here. In fact I do not think anything much is changed. The substantial area on his left lateral and left posterior calf is still open may be slightly larger. He has a more modest wound on the right medial calf. None of these look any different. He comes in with a kerlix Coban wrap this will simply not be adequate. He has too much edema in this leg He also is complaining a lot of pain in his left heel area. 11/8; potential wounds on the left lateral and posterior calf and a smaller oval wound in the right medial. We have been using Hydrofera Blue under 4-layer compression. He was using Unna boots still 2 months ago the facility called to say they can no longer place these so we have been using 4-layer compression. The surface area of the area on the left is so large there is very few alternatives  to what we can use on this either silver alginate or Hydrofera Blue. He was complaining of pain on the left heel last time I asked for an x-ray this was apparently done although we do not have the result. He is not complaining of pain today. 08/16/2020 on evaluation today patient is is seen for a early visit due to the fact that he apparently is having issues I was told when they called on Monday with a MRSA infection that they felt like we needed to see him for because his legs "looked horrible". With that being said based on what I see at this point it does not appear that the patient is actually having a terrible infection in fact compared to last time I saw him his legs do not appear to be doing too poorly at all at this time. Nonetheless he has been on doxycycline that is not going to be a good option for him based on the culture report that I have for review today which graded as a partial report with still several organisms pending as far as identification is concerned we did contact them but again they do not have the final report yet. Either way based on what we see it appears that doxycycline is one of the few medications that will not work for the Citrobacter. Obviously I think that a different medication would be a good option for him since there is a gram-negative organism pending I am likely can I suggest Cipro as the best of backup antibiotic to switch him to at this point. All this was discussed with the patient today. Electronic Signature(s) Signed: 08/16/2020 3:16:43 PM By: Worthy Keeler PA-C Entered By: Worthy Keeler on 08/16/2020 15:16:42 -------------------------------------------------------------------------------- Physical Exam Details Patient Name: Date of Service: [name] 08/16/2020 1:45 PM Medical Record Number: [id number] Patient Account Number: [id number] Date of Birth/Sex: Treating RN: 03-10-1956 (64 y.o. [name] Primary Care Provider: [name] Other Clinician: Referring Provider: Treating Provider/Extender: [name] in Treatment: 24 Constitutional Obese and well-hydrated in no acute distress. Respiratory normal breathing without difficulty. Psychiatric this patient is able to make decisions and demonstrates good insight into disease process. Alert and Oriented x 3. pleasant and cooperative. Notes Upon inspection patient's wound bed actually showed signs  of good granulation at this time. Fortunately there is no evidence of active infection which is great news. He did have some slough noted but really obscene his legs and much worse condition than what I see evidence of today. Again I do not see anything on the report that shows MRSA is a finding at this point there is a gram-negative organism pending work-up although Citrobacter koseri was the isolate of the first part of this culture that is all I had for review today that I can comment on. No sharp debridement was performed at this point Electronic Signature(s) Signed: 08/16/2020 3:17:30 PM By: Worthy Keeler PA-C Entered By: Worthy Keeler on 08/16/2020 15:17:30 -------------------------------------------------------------------------------- Physician Orders Details Patient Name: Date of Service: [name] 08/16/2020 1:45 PM Medical Record Number: [id number] Patient Account Number: [id number] Date of Birth/Sex: Treating RN: 20-Jun-1956 (64 y.o. [name], Lauren Primary Care Provider: [name] Other Clinician: Referring Provider: Treating Provider/Extender: [name] in Treatment: 24 Verbal / Phone Orders: No Diagnosis Coding ICD-10 Coding Code Description [phone] Chronic venous hypertension (idiopathic) with ulcer and inflammation of bilateral lower extremity I89.0 Lymphedema, not elsewhere classified L97.222 Non-pressure chronic ulcer of left calf with fat layer exposed L97.212  Non-pressure chronic ulcer of right calf with fat layer exposed Follow-up Appointments ppointment in: - 6 weeks Return A Dressing Change Frequency Wound #21 Left,Distal Lower Leg Change dressing three times week. Wound #22 Right,Medial Lower Leg Change dressing three times week. Skin Barriers/Peri-Wound Care Barrier cream - as needed Moisturizing lotion Wound Cleansing Clean wound with Wound Cleanser - or normal saline Primary Wound Dressing Wound #21 Left,Distal Lower Leg Calcium Alginate with Silver Wound #22 Right,Medial Lower Leg Calcium Alginate with Silver Secondary Dressing Wound #21 Left,Distal Lower Leg ABD pad Zetuvit or Kerramax - or equivalent super absorbent pad Wound #22 Right,Medial Lower Leg ABD pad Zetuvit or Kerramax - or equivalent super absorbent pad Edema Control 4 layer compression - Bilateral Avoid standing for long periods of time Elevate legs to the level of the heart or above for 30 minutes daily and/or when sitting, a frequency of: - throughout the day Patient Medications llergies: Sulfa (Sulfonamide Antibiotics) A Notifications Medication Indication Start End 08/16/2020 Cipro DOSE 1 - oral 500 mg tablet - 1 tablet oral taken 2 times per day for 14 days Electronic Signature(s) Signed: 08/16/2020 5:04:05 PM By: Worthy Keeler PA-C Entered By: Worthy Keeler on 08/16/2020 15:19:30 Prescription 08/16/2020 -------------------------------------------------------------------------------- Megill, Marjory Lies PA Patient Name: Provider: 09/19/1956 [id number] Date of Birth: NPI#Florentina Addison Sex: DEA #: [phone] Phone #: License #: [place] Patient Address: [address], [place] [address] [address] [place], [place] [address] [phone] Allergies Sulfa (Sulfonamide Antibiotics) Medication Medication: Route: Strength: Form: Cipro oral 500 mg  tablet Class: QUINOLONE ANTIBIOTICS Dose: Frequency / Time: Indication: 1 1 tablet oral taken 2 times per day for 14 days Number of Refills: Number of Units: 0 Twenty Eight (28) Tablet(s) Generic Substitution: Start Date: End Date: Administered at Facility: Substitution Permitted 85/50/1586 No Note to Pharmacy: Hand Signature: Date(s): Electronic Signature(s) Signed: 08/16/2020 5:04:05 PM By: Worthy Keeler PA-C Entered By: Worthy Keeler on 08/16/2020 15:19:31 -------------------------------------------------------------------------------- Problem List Details Patient Name: Date of Service: [name] 08/16/2020 1:45 PM Medical Record Number: [id number] Patient Account Number: [id number] Date of Birth/Sex: Treating RN: 1956/03/26 (64 y.o. [name] Primary Care  Provider: [name] Other Clinician: Referring Provider: Treating Provider/Extender: [name] in Treatment: 24 Active Problems ICD-10 Encounter Code Description Active Date MDM Diagnosis I87.333 Chronic venous hypertension (idiopathic) with ulcer and inflammation of 02/25/2020 No Yes bilateral lower extremity I89.0 Lymphedema, not elsewhere classified 02/25/2020 No Yes L97.222 Non-pressure chronic ulcer of left calf with fat layer exposed 02/25/2020 No Yes L97.212 Non-pressure chronic ulcer of right calf with fat layer exposed 02/25/2020 No Yes Inactive Problems Resolved Problems Electronic Signature(s) Signed: 08/16/2020 1:56:22 PM By: Worthy Keeler PA-C Signed: 08/16/2020 1:56:22 PM By: Worthy Keeler PA-C Entered By: Worthy Keeler on 08/16/2020 13:56:22 -------------------------------------------------------------------------------- Progress Note Details Patient Name: Date of Service: [name] 08/16/2020 1:45 PM Medical Record Number: [id number] Patient Account Number: [id number] Date of Birth/Sex: Treating RN: 12/03/1955 (64 y.o. [name] Primary Care Provider: [name] Other Clinician: Referring Provider: Treating Provider/Extender: [name] in Treatment: 24 Subjective Chief Complaint Information obtained from Patient Bilateral lower extremity wounds 02/25/2020; patient is back in clinic today for review of wounds on his left posterior greater than right lower extremity wounds History of Present Illness (HPI) 07/07/15; this is a patient who is in hospital on 8/2 through 8/4. He had cellulitis and abscess of predominantly I think the left leg. He received IV antibiotics. Plain x-ray showed no osteomyelitis. An MRI of the left leg did not show osteomyelitis. Cultures showed no predominant organism. His hemoglobin A1c was 9.8. He has a history of venous stasis also peripheral vascular disease. He was discharged to [facility]. He really has extensive ulcerations on the left lateral leg including a major wound that communicates both posteriorly and superiorly w. When drainage coming out of 2 smaller areas. He has a smaller wound on the posterior medial left leg. He has more predominantly venous insufficiency wounds on predominantly the right medial leg above the ankle the right foot into sections. He has recently been put on Augmentin at the nursing home. Previous ABIs/arterial evaluation showed triphasic waves diffusely he does not have a major ischemic issue a left lower extremity venous duplex also exam showed no evidence of a DVT on 6/21 07/21/15; the patient arrives with really no major change. Culture I did last time was negative. He has not had a follow-up MRI I ordered. The wounds are macerated covered with a thick gelatinous surface slough. There is necrotic subcutaneous tissue 08/04/15; the patient arrives with a considerable improvement in the majority of his wound area. The area on the left leg now has what looks to be a granulated base. Most of the wounds on the  left leg required an aggressive surgical debridement to remove nonviable fibrinous eschar and subcutaneous tissue however after debridement most of this looks better. Although I had asked for repeat MRI of the left leg when he first came in here with grossly purulent material coming out of his wounds, it does not appear that this is been done and nor do I actually feel that strongly about it right now. He has severe surrounding venous insufficiency and inflammation. I don't believe he has significant PAD 08/25/15. In general there is still a considerable wound area here but with still extensive service adherent slough. The patient will not allow mechanical debridement due to pain. He did not tolerate Medihoney therefore we are left with Santyl for now. She would appear that he has severe surrounding venous stasis. There is no evidence of  the infection that may have had something to do with the pathogenesis of these wounds 09/29/15; patient still has substantial wound area on the left leg with a cluster of several wounds on the lateral leg confluently on the left leg posteriorly and then a substantial wound on the medial leg. On the right leg a substantial wound medially and a small area on the right lateral foot. All of these underwent a substantial surgical debridement with curettes which she tolerated better than he has in the past. This started as a complex cellulitis in the face of chronic venous insufficiency and inflammation. 10/13/15; substantial cluster of wounds on the lateral aspect of his left leg confluently around to the other side. Extensive surgical debridement to remove for redness surface slough nonviable subcutaneous tissue. This is not an improvement. Also substantial wound on the medial right leg which is largely unchanged. The etiology of this was felt to be a complex cellulitis in the summer of 2016 in the face of chronic venous insufficiency and inflammation. The patient states that  the wound on the right medial leg has been there for years off and on. 10/20/15; we are able to start [facility] to these extensive wound areas left greater than right on Tuesday after I spoke to the wound care nurse at the facility. He arrives here today for extensive surgical debridement for the wounds on the lateral aspect of his left leg posterior left leg, this is almost circumferential. He has a large wound on the medial aspect of his right leg although he finds this too painful for debridement 10/27/15; I continue to bring this patient back for frequent debridement/weekly debridement severe. We have been using Hydrofera Blue. Unfortunately this has not really had any improvement. The debridement surgery difficult and painful for the patient 11/23/15; this patient spent a complex hospitalization admitted with acute kidney injury, anemia, cellulitis of the lower extremity, he was felt to have sepsis pathophysiology although his blood cultures were negative. He had plain x-rays of both legs that were negative for osseous abnormalities. He was seen by infectious disease and placed on a workup for vasculitis that was negative including a biopsy o4 all were consistent with stasis dermatitis. Vital broad-spectrum antibiotics he continued to spike fevers. Urine and chest x-ray were negative Dopplers on admission ruled out a DVT All antibiotics were stopped on 2/17. . Since his return to [facility] skilled nursing facility I believe they have been using Xeroform 12/07/15 the patient arrives today with the area on his right medial leg actually looking quite stable. No debridement. The rest of his extensive wounds on the left lateral left posterior extending into the left medial leg all required extensive debridement. I think this is probably going to need to and up in the hands of plastic surgery. We'll attempt to change him back to [facility]. Arrange consultation with plastic surgery at [facility] for  this almost circumferential wound on the left side 12/21/15 right leg covered in surface slough. This was debridement. Left leg extensive wounds all carefully examined. This is almost circumferential on the left side especially on the posterior calf. No debridement is necessary. 01/04/16 the patient has been to [facility] plastic surgery. Unfortunately it doesn't look like they had any of the prior workup on this patient. They're in the middle of vascular workup. It sounds as though they're applying Hydrofera Blue at the nursing home. 01/22/16; the patient has been back to see [facility]. There apparently making plans to possibly  do skin grafts over his large venous insufficiency ulcerations. The patient tells me that he goes to hematology in [facility] and variably uses the term platelets red cells and the description of his problem. 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 I do not see any major difference in the wound on the right medial leg and circumferentially across the left medial to left lateral leg. Did not attempt to debride these today. Readmission: 05/05/18 on evaluation today patient presents for reevaluation here in our clinic although I have previously seen him in the skilled nursing facility over the past year and a half when I was working in the skilled nursing facility realm instead of covering in the clinic. With that being said during that time we had initiated most recently dressings with Hydrofera Blue Dressing along with the [employer] which had done very well for him. Much better than the Kerlex Coban wraps. With that being said the patient had been doing fairly well and was making progress when I last saw him. Upon evaluation today it appears that the wounds are actually a little bit worse than when I last saw him although definitely not dramatically so. There does not appear to be any evidence of infection which is good news. With  that being said he has been tolerating the wraps without complication his left hip still gives him a lot of trouble which is his main issue as far as movement is concerned. Unbeknownst to me he has not had venous studies that I can find. He previously told me he had there were arterial studies noted back from 04/27/15 which showed that he had try phasic blood flow throughout and again his test appear to be completely normal as performed by Dr. Creig Hines. With that being said I cannot find where he had venous studies. The patient still states he thinks he did these definitely had no venous intervention at this point. Upon evaluation today it does not appear to me that the patient has any evidence of cellulitis. He does have some chronic venous insufficiency which I think has led to stasis dermatitis but again this is not appearing to be infected at this point. No fevers, chills, nausea, or vomiting noted at this time. READMISSION 06/30/2018 This is a now 64 year old man we have had in this clinic at multiple times in the past. Most recently he came in August and was seen by [facility] stone. It is not clear why he did not come back we asked him really did not get a straight answer. He is at [facility] skilled facility he is a man who has severe chronic venous insufficiency with lymphedema and has had severe wounds on his left greater than right leg. We had him here in 2016 and 17 ultimately referred him to [facility]. He had arterial studies and venous studies done at [facility] although I am not able to access these records I see they are actually done. He also had multiple biopsies that were negative for malignancy showing changes compatible with chronic inflammation. As I understand things in [place] they are using Iodoflex and Unna boots. I am not really sure how they are getting enough Iodoflex for the large wound area especially on his left calf. Nevertheless the wounds look better than when I saw  this in 2017. There were plans for him to have plastic surgery in 2018 and I think he was actually prepped for surgery however  he was ultimately denied because the patient was an active smoker. The patient is not systemically unwell. The wounds are painful however given the size especially on the left this is not surprising. ABIs in this clinic were 0.78 on the left and 0.91 on the right 07/13/2018; this is a patient with bilateral severe venous ulcers with secondary lymphedema. The wounds are on the right medial calf and a substantial part of the left posterior calf and some involvement medially and laterally on the left. We changed him to silver alginate under Unna boot's last time. The wounds actually look fairly satisfactory today. 08/14/2018; this is a patient with severe bilateral venous stasis ulcers with secondary lymphedema left greater than right he is at [facility] using silver alginate under Unna boot's I do not think there is much change on this visit versus last time I saw him a month ago Readmission: 11/04/18 patient presents today for follow-up concerning his bilateral lower extremity ulcers. His a right medial ankle ulcer and a left leg that has ulcers over a large proportion of the surface area between the ankle and knee. Unfortunately this does cause some discomfort for the patient although it doesn't seem to be as uncomfortable as it has been in the past. He is seen today for evaluation after a referral by [place] back to the clinic. Unfortunately has a lot of Slough noted on the surface of his wounds. He's been treated currently it sounds like with Dakin's soaked gauze and they have not been performing the Unna Boot wrap that was previously recommended. I'm not exactly sure what the reason for the change was. He does have less slough on the surface of the wound but again this is something that is often a constant issue for him. Again he's had these wounds for many years. I've  known him for two years at least during that time when I was taking care of them in the facility he is Artie had the wounds for several years prior. READMISSION 02/25/2020 [name] is a now 64 year old man. He has been in our clinic several times before most extensively in October 2016 through May 2017. At that time he had absolutely substantial bilateral lower extremity wounds secondary to chronic venous insufficiency and lymphedema. We ultimately referred him to [facility] he had a series of biopsies only showed suggestion of wound secondary to chronic venous insufficiency. He had a less extensive stay in our clinic in 2019 and then a single visit in February 2020. He is a resident at [facility] skilled facility. I think he has had intermittently had in facility wound care. He returns today. They are using silver alginate on the wounds although I am not sure what type of compression. Previously he has favored Unna boots. He is not felt to have an arterial issue. Past medical history; lymphedema and chronic venous insufficiency, diabetes mellitus, hypertension, congestive heart failure We did not do arterial studies today 04/27/2020 on evaluation patient appears to be doing really about the same as when I have known him and seen him in the past. He does have wounds on the right and left legs at this point. Fortunately there is no signs of active infection at this time. 9/2; 1 month follow-up. This is a man with severe chronic venous insufficiency and lymphedema. When I first met him he had horrible circumferential bilateral lower extremity ulcers. We have been using silver alginate up until early last month when it was changed to [facility] and  Unna boot's more or less says maintenance dressings. Since the last time he was here the facility where he resides [facility] called to report they could no longer do Unna boot so he is apparently only been receiving kerlix Coban the left leg is certainly  a lot worse with almost circumferential large wounds especially lateral but now medial and posterior as well. He has a standard same looking wound on the right medial lower leg. 10/1; absolutely no improvement here. In fact I do not think anything much is changed. The substantial area on his left lateral and left posterior calf is still open may be slightly larger. He has a more modest wound on the right medial calf. None of these look any different. He comes in with a kerlix Coban wrap this will simply not be adequate. He has too much edema in this leg He also is complaining a lot of pain in his left heel area. 11/8; potential wounds on the left lateral and posterior calf and a smaller oval wound in the right medial. We have been using Hydrofera Blue under 4-layer compression. He was using Unna boots still 2 months ago the facility called to say they can no longer place these so we have been using 4-layer compression. The surface area of the area on the left is so large there is very few alternatives to what we can use on this either silver alginate or Hydrofera Blue. He was complaining of pain on the left heel last time I asked for an x-ray this was apparently done although we do not have the result. He is not complaining of pain today. 08/16/2020 on evaluation today patient is is seen for a early visit due to the fact that he apparently is having issues I was told when they called on Monday with a MRSA infection that they felt like we needed to see him for because his legs "looked horrible". With that being said based on what I see at this point it does not appear that the patient is actually having a terrible infection in fact compared to last time I saw him his legs do not appear to be doing too poorly at all at this time. Nonetheless he has been on doxycycline that is not going to be a good option for him based on the culture report that I have for review today which graded as a partial report  with still several organisms pending as far as identification is concerned we did contact them but again they do not have the final report yet. Either way based on what we see it appears that doxycycline is one of the few medications that will not work for the Citrobacter. Obviously I think that a different medication would be a good option for him since there is a gram-negative organism pending I am likely can I suggest Cipro as the best of backup antibiotic to switch him to at this point. All this was discussed with the patient today. Objective Constitutional Obese and well-hydrated in no acute distress. Vitals Time Taken: 2:31 PM, Height: 77 in, Weight: 254 lbs, BMI: 30.1, Temperature: 98.5 F, Pulse: 82 bpm, Respiratory Rate: 18 breaths/min, Blood Pressure: 148/75 mmHg, Capillary Blood Glucose: 211 mg/dl. General Notes: glucose per pt report Respiratory normal breathing without difficulty. Psychiatric this patient is able to make decisions and demonstrates good insight into disease process. Alert and Oriented x 3. pleasant and cooperative. General Notes: Upon inspection patient's wound bed actually showed signs of good granulation  at this time. Fortunately there is no evidence of active infection which is great news. He did have some slough noted but really obscene his legs and much worse condition than what I see evidence of today. Again I do not see anything on the report that shows MRSA is a finding at this point there is a gram-negative organism pending work-up although Citrobacter koseri was the isolate of the first part of this culture that is all I had for review today that I can comment on. No sharp debridement was performed at this point Integumentary (Hair, Skin) Wound #21 status is Open. Original cause of wound was Gradually Appeared. The wound is located on the Left,Distal Lower Leg. The wound measures 18.5cm length x 16.5cm width x 0.9cm depth; 239.743cm^2 area and 215.769cm^3  volume. There is Fat Layer (Subcutaneous Tissue) exposed. There is no tunneling or undermining noted. There is a large amount of purulent drainage noted. The wound margin is distinct with the outline attached to the wound base. There is medium (34-66%) pink granulation within the wound bed. There is a medium (34-66%) amount of necrotic tissue within the wound bed including Adherent Slough. Wound #22 status is Open. Original cause of wound was Gradually Appeared. The wound is located on the Right,Medial Lower Leg. The wound measures 4.7cm length x 3.8cm width x 0.3cm depth; 14.027cm^2 area and 4.208cm^3 volume. There is Fat Layer (Subcutaneous Tissue) exposed. There is no tunneling or undermining noted. There is a medium amount of serosanguineous drainage noted. The wound margin is flat and intact. There is medium (34-66%) pink granulation within the wound bed. There is a medium (34-66%) amount of necrotic tissue within the wound bed including Adherent Slough. Assessment Active Problems ICD-10 Chronic venous hypertension (idiopathic) with ulcer and inflammation of bilateral lower extremity Lymphedema, not elsewhere classified Non-pressure chronic ulcer of left calf with fat layer exposed Non-pressure chronic ulcer of right calf with fat layer exposed Procedures Wound #21 Pre-procedure diagnosis of Wound #21 is a Venous Leg Ulcer located on the Left,Distal Lower Leg . There was a Four Layer Compression Therapy Procedure by Carlene Coria, RN. Post procedure Diagnosis Wound #21: Same as Pre-Procedure Wound #22 Pre-procedure diagnosis of Wound #22 is a Venous Leg Ulcer located on the Right,Medial Lower Leg . There was a Four Layer Compression Therapy Procedure by Carlene Coria, RN. Post procedure Diagnosis Wound #22: Same as Pre-Procedure Plan Follow-up Appointments: Return Appointment in: - 6 weeks Dressing Change Frequency: Wound #21 Left,Distal Lower Leg: Change dressing three times  week. Wound #22 Right,Medial Lower Leg: Change dressing three times week. Skin Barriers/Peri-Wound Care: Barrier cream - as needed Moisturizing lotion Wound Cleansing: Clean wound with Wound Cleanser - or normal saline Primary Wound Dressing: Wound #21 Left,Distal Lower Leg: Calcium Alginate with Silver Wound #22 Right,Medial Lower Leg: Calcium Alginate with Silver Secondary Dressing: Wound #21 Left,Distal Lower Leg: ABD pad Zetuvit or Kerramax - or equivalent super absorbent pad Wound #22 Right,Medial Lower Leg: ABD pad Zetuvit or Kerramax - or equivalent super absorbent pad Edema Control: 4 layer compression - Bilateral Avoid standing for long periods of time Elevate legs to the level of the heart or above for 30 minutes daily and/or when sitting, a frequency of: - throughout the day The following medication(s) was prescribed: Cipro oral 500 mg tablet 1 1 tablet oral taken 2 times per day for 14 days starting 08/16/2020 1. I would recommend at this time that the patient continue with the current wound care measures. We  have actually been using a silver alginate dressing currently to the wounds. 2. I am also can recommend that we continue with a 4-layer compression wrap bilaterally. Apparently they are unable to do Unna boot wraps at the facility any longer therefore we transition him to this however what he actually had on today with a Curlex and Coban wrap which is not equivalent to a 4-layer compression wrap. 3. I am also going to going switch him to Curlex having and discontinue the doxycycline and I did make note of this in the orders to send back with him as well. 4. I am also can recommend the patient needs to continue to elevate his legs he still is not really mobile due to his chronic hip pain unfortunately. With that being said I do think elevation is key here. We will see patient back for reevaluation in 6 weeks here in the clinic. If anything worsens or changes patient  will contact our office for additional recommendations. Electronic Signature(s) Signed: 08/16/2020 3:19:48 PM By: Worthy Keeler PA-C Entered By: Worthy Keeler on 08/16/2020 15:19:48 -------------------------------------------------------------------------------- SuperBill Details Patient Name: Date of Service: [name] 08/16/2020 Medical Record Number: [id number] Patient Account Number: [id number] Date of Birth/Sex: Treating RN: 10-26-1955 (64 y.o. [name], Lauren Primary Care Provider: [name] Other Clinician: Referring Provider: Treating Provider/Extender: [name] in Treatment: 24 Diagnosis Coding ICD-10 Codes Code Description [phone] Chronic venous hypertension (idiopathic) with ulcer and inflammation of bilateral lower extremity I89.0 Lymphedema, not elsewhere classified L97.222 Non-pressure chronic ulcer of left calf with fat layer exposed L97.212 Non-pressure chronic ulcer of right calf with fat layer exposed Facility Procedures CPT4: Code [id number] foot Description: 1 BILATERAL: Application of multi-layer venous compression system; leg (below knee), including ankle and . Modifier: Quantity: 1 Physician Procedures : CPT4 Code Description Modifier [id number] - WC PHYS LEVEL 4 - EST PT ICD-10 Diagnosis Description I87.333 Chronic venous hypertension (idiopathic) with ulcer and inflammation of bilateral lower extremity I89.0 Lymphedema, not elsewhere classified  L97.222 Non-pressure chronic ulcer of left calf with fat layer exposed L97.212 Non-pressure chronic ulcer of right calf with fat layer exposed Quantity: 1 Electronic Signature(s) Signed: 08/16/2020 3:20:01 PM By: Worthy Keeler PA-C Entered By: Worthy Keeler on 08/16/2020 15:20:00

## 2020-08-21 NOTE — Progress Notes (Signed)
[name] ([id number]) Visit Report for 08/16/2020 Arrival Information Details [name], [name] 08/16/2020 1:45 PM Medical Record Number: [id number] Patient Account Number: [id number] Date of Birth/Sex: Treating RN: 11-02-1955 (64 y.o. [name] Primary Care Claudette Wermuth: [name] Other Clinician: Referring Zed Wanninger: Treating Sachi Boulay/Extender: [name] in Treatment: 24 Visit Information History Since Last Visit Added or deleted any medications: No Patient Arrived: Wheel Chair Any new allergies or adverse reactions: No Arrival Time: 14:30 Had a fall or experienced change in No Accompanied By: alone activities of daily living that may affect Transfer Assistance: Transfer Board risk of falls: Patient Identification Verified: Yes Signs or symptoms of abuse/neglect since last visito No Secondary Verification Process Completed: Yes Hospitalized since last visit: No Patient Requires Transmission-Based Precautions: No Implantable device outside of the clinic excluding No Patient Has Alerts: No cellular tissue based products placed in the center since last visit: Has Dressing in Place as Prescribed: Yes Has Compression in Place as Prescribed: Yes Pain Present Now: Yes Electronic Signature(s) Signed: 08/16/2020 4:29:16 PM By: [name] Entered By: [name] on 08/16/2020 14:31:10 -------------------------------------------------------------------------------- Compression Therapy Details [name], [name] 08/16/2020 1:45 PM Medical Record Number: [id number] Patient Account Number: [id number] Date of Birth/Sex: Treating RN: 1956/06/07 (65 y.o. [name] Primary Care Everley Evora: [name] Other Clinician: Referring Ceria Suminski: Treating Razi Hickle/Extender: [name] in Treatment: 24 Compression Therapy Performed for Wound Assessment:  Wound #21 Left,Distal Lower Leg Performed By: Clinician Carlene Coria, RN Compression Type: Four Layer Post Procedure Diagnosis Same as Pre-procedure Electronic Signature(s) Signed: 08/21/2020 5:13:46 PM By: Rhae Hammock RN Entered By: Rhae Hammock on 08/16/2020 15:08:30 -------------------------------------------------------------------------------- Compression Therapy Details [name], [name] 08/16/2020 1:45 PM Medical Record Number: [id number] Patient Account Number: [id number] Date of Birth/Sex: Treating RN: 10-Mar-1956 (64 y.o. [name], Lauren Primary Care Madilynne Mullan: [name] Other Clinician: Referring Amerie Beaumont: Treating Panzy Bubeck/Extender: [name] in Treatment: 24 Compression Therapy Performed for Wound Assessment: Wound #22 Right,Medial Lower Leg Performed By: Clinician Carlene Coria, RN Compression Type: Four Layer Post Procedure Diagnosis Same as Pre-procedure Electronic Signature(s) Signed: 08/21/2020 5:13:46 PM By: Rhae Hammock RN Entered By: Rhae Hammock on 08/16/2020 15:08:30 -------------------------------------------------------------------------------- Encounter Discharge Information Details [name], [name] 08/16/2020 1:45 PM Medical Record Number: [id number] Patient Account Number: [id number] Date of Birth/Sex: Treating RN: 10-Aug-1956 (64 y.o. [name] Primary Care Kamara Allan: [name] Other Clinician: Referring Serenna Deroy: Treating Christmas Faraci/Extender: [name] in Treatment: 24 Encounter Discharge Information Items Discharge Condition: Stable Ambulatory Status: Wheelchair Discharge Destination: Home Transportation: Private Auto Accompanied By: self Schedule Follow-up Appointment: Yes Clinical Summary of Care: Patient Declined Electronic Signature(s) Signed: 08/16/2020 4:15:58 PM By: Carlene Coria RN Entered By: Carlene Coria on 08/16/2020 15:42:13 -------------------------------------------------------------------------------- Lower Extremity Assessment Details [name], [name] 08/16/2020 1:45 PM Medical Record Number: [id number] Patient Account Number: [id number] Date of Birth/Sex: Treating RN: 05-Jun-1956 (64 y.o. [name] Primary Care Elaijah Munoz: [name] Other Clinician: Referring Alfredo Spong: Treating Tomoki Lucken/Extender: Alexis Goodell Weeks in Treatment: 24 Edema Assessment Assessed: [Left: No] [Right: No] E[Left: dema] [Right: :] Calf Left: Right: Point of Measurement: 52 cm From Medial Instep 40 cm 40 cm Ankle Left: Right: Point of Measurement: 10 cm From Medial Instep 26 cm 27 cm Vascular Assessment Pulses: Dorsalis  Pedis Palpable: [Left:Yes] [Right:Yes] Electronic Signature(s) Signed: 08/16/2020 4:29:16 PM By: [name] Entered By: [name] on 08/16/2020 14:36:06 -------------------------------------------------------------------------------- Multi-Disciplinary Care Plan Details [name], [name] 08/16/2020 1:45 PM Medical Record Number: [id number] Patient Account Number: [id number] Date of Birth/Sex: Treating RN: 1956/03/22 (64 y.o. [name] Primary Care Tashon Capp: [name] Other Clinician: Referring Ellyana Crigler: Treating Shamyah Stantz/Extender: [name] in Treatment: 24 Active Inactive Wound/Skin Impairment Nursing Diagnoses: Impaired tissue integrity Goals: Patient/caregiver will verbalize understanding of skin care regimen Date Initiated: 04/27/2020 Target Resolution Date: 09/01/2020 Goal Status: Active Ulcer/skin breakdown will have a volume reduction of 50% by week 8 Date Initiated: 02/25/2020 Date Inactivated: 04/27/2020 Target Resolution Date: 04/14/2020 Goal Status: Met Interventions: Provide  education on ulcer and skin care Notes: Electronic Signature(s) Signed: 08/21/2020 5:13:46 PM By: Rhae Hammock RN Entered By: Rhae Hammock on 08/16/2020 15:06:18 -------------------------------------------------------------------------------- Pain Assessment Details [name], [name] 08/16/2020 1:45 PM Medical Record Number: [id number] Patient Account Number: [id number] Date of Birth/Sex: Treating RN: [birth year] (65 y.o. [name] Primary Care Kynli Chou: [name] Other Clinician: Referring Emmaus Brandi: Treating Johnmichael Melhorn/Extender: [name] in Treatment: 24 Active Problems Location of Pain Severity and Description of Pain Patient Has Paino Yes Site Locations Pain Location: Pain in Ulcers With Dressing Change: Yes Rate the pain. Current Pain Level: 5 Character of Pain Describe the Pain: Burning Pain Management and Medication Current Pain Management: Medication: Yes Cold Application: No Rest: No Massage: No Activity: No T.E.N.S.: No Heat Application: No Leg drop or elevation: No Is the Current Pain Management Adequate: Adequate How does your wound impact your activities of daily livingo Sleep: No Bathing: No Appetite: No Relationship With Others: No Bladder Continence: No Emotions: No Bowel Continence: No Work: No Toileting: No Drive: No Dressing: No Hobbies: No Electronic Signature(s) Signed: 08/16/2020 4:29:16 PM By: [name] Entered By: [name] on 08/16/2020 14:31:52 -------------------------------------------------------------------------------- Patient/Caregiver Education Details [name], [name] 11/24/2021andnbsp1:45 PM Medical Record Number: [id number] Patient Account Number: [id number] Date of Birth/Gender: Treating RN: April 28, 1956 (64 y.o. [name] Primary Care Physician: [name] Other Clinician: Referring  Physician: Treating Physician/Extender: [name] in Treatment: 24 Education Assessment Education Provided To: Patient Education Topics Provided Wound/Skin Impairment: Methods: Explain/Verbal Responses: Reinforcements needed, State content correctly [employer]) Signed: 08/21/2020 5:13:46 PM By: Rhae Hammock RN Entered By: Rhae Hammock on 08/16/2020 15:07:07 -------------------------------------------------------------------------------- Wound Assessment Details [name], [name] 08/16/2020 1:45 PM Medical Record Number: [id number] Patient Account Number: [id number] Date of Birth/Sex: Treating RN: 11-08-1955 (64 y.o. [name] Primary Care Kadian Barcellos: [name] Other Clinician: Referring Fue Cervenka: Treating Masiah Lewing/Extender: Alexis Goodell Weeks in Treatment: 24 Wound Status Wound Number: 21 Primary Venous Leg Ulcer Etiology: Wound Location: Left, Distal Lower Leg Wound Open Wounding Event: Gradually Appeared Status: Date Acquired: 11/22/2019 Comorbid Anemia, Sleep Apnea, Congestive Heart Failure, Hypertension, Weeks Of Treatment: 24 History: Peripheral Venous Disease, Type II Diabetes, Osteoarthritis, Clustered Wound: No Neuropathy Wound Measurements Length: (cm) 18.5 Width: (cm) 16.5 Depth: (cm) 0.9 Area: (cm) 239.743 Volume: (cm) 215.769 % Reduction in Area: -52.6% % Reduction in Volume: -128.9% Epithelialization: Small (1-33%) Tunneling: No Undermining: No Wound Description Classification: Full Thickness Without Exposed Support Structures Wound Margin: Distinct, outline attached Exudate Amount: Large Exudate Type: Purulent Exudate Color: yellow, brown, green Foul Odor After Cleansing: No Slough/Fibrino  Yes Wound Bed Granulation Amount: Medium (34-66%) Exposed Structure Granulation Quality: Pink Fascia Exposed: No Necrotic Amount: Medium  (34-66%) Fat Layer (Subcutaneous Tissue) Exposed: Yes Necrotic Quality: Adherent Slough Tendon Exposed: No Muscle Exposed: No Joint Exposed: No Bone Exposed: No Treatment Notes Wound #21 (Left, Distal Lower Leg) 1. Cleanse With Wound Cleanser Soap and water 3. Primary Dressing Applied Calcium Alginate Ag 4. Secondary Dressing ABD Pad Dry Gauze 6. Support Layer Applied 4 layer compression wrap Notes netting Electronic Signature(s) Signed: 08/16/2020 4:29:16 PM By: [name] Entered By: [name] on 08/16/2020 14:37:49 -------------------------------------------------------------------------------- Wound Assessment Details [name], [name] 08/16/2020 1:45 PM Medical Record Number: [id number] Patient Account Number: [id number] Date of Birth/Sex: Treating RN: May 20, 1956 (64 y.o. [name] Primary Care Wynee Matarazzo: [name] Other Clinician: Referring Daiana Vitiello: Treating Nikita Surman/Extender: Alexis Goodell Weeks in Treatment: 24 Wound Status Wound Number: 22 Primary Venous Leg Ulcer Etiology: Wound Location: Right, Medial Lower Leg Wound Open Wounding Event: Gradually Appeared Status: Date Acquired: 12/23/2019 Comorbid Anemia, Sleep Apnea, Congestive Heart Failure, Hypertension, Weeks Of Treatment: 24 History: Peripheral Venous Disease, Type II Diabetes, Osteoarthritis, Clustered Wound: No Neuropathy Wound Measurements Length: (cm) 4.7 Width: (cm) 3.8 Depth: (cm) 0.3 Area: (cm) 14.027 Volume: (cm) 4.208 % Reduction in Area: 38% % Reduction in Volume: 7% Epithelialization: Small (1-33%) Tunneling: No Undermining: No Wound Description Classification: Full Thickness Without Exposed Support Structures Wound Margin: Flat and Intact Exudate Amount: Medium Exudate Type: Serosanguineous Exudate Color: red, brown Foul Odor After Cleansing: No Slough/Fibrino Yes Wound Bed Granulation Amount: Medium  (34-66%) Exposed Structure Granulation Quality: Pink Fascia Exposed: No Necrotic Amount: Medium (34-66%) Fat Layer (Subcutaneous Tissue) Exposed: Yes Necrotic Quality: Adherent Slough Tendon Exposed: No Muscle Exposed: No Joint Exposed: No Bone Exposed: No Treatment Notes Wound #22 (Right, Medial Lower Leg) 1. Cleanse With Wound Cleanser Soap and water 3. Primary Dressing Applied Calcium Alginate Ag 4. Secondary Dressing ABD Pad Dry Gauze 6. Support Layer Applied 4 layer compression wrap Notes netting Electronic Signature(s) Signed: 08/16/2020 4:29:16 PM By: [name] Entered By: [name] on 08/16/2020 14:38:09 -------------------------------------------------------------------------------- Vitals Details [name], [name] 08/16/2020 1:45 PM Medical Record Number: [id number] Patient Account Number: [id number] Date of Birth/Sex: Treating RN: [birth year] (64 y.o. [name] Primary Care Sammie Denner: [name] Other Clinician: Referring Remiel Corti: Treating Raquon Milledge/Extender: [name] in Treatment: 24 Vital Signs Time Taken: 14:31 Temperature (F): 98.5 Height (in): 77 Pulse (bpm): 82 Weight (lbs): 254 Respiratory Rate (breaths/min): 18 Body Mass Index (BMI): 30.1 Blood Pressure (mmHg): 148/75 Capillary Blood Glucose (mg/dl): 211 Reference Range: 80 - 120 mg / dl Notes glucose per pt report Electronic Signature(s) Signed: 08/16/2020 4:29:16 PM By: [name] Entered By: [name] on 08/16/2020 14:31:37

## 2020-08-29 ENCOUNTER — Other Ambulatory Visit: Payer: Self-pay

## 2020-08-29 DIAGNOSIS — N3281 Overactive bladder: Secondary | ICD-10-CM | POA: Insufficient documentation

## 2020-08-29 DIAGNOSIS — I878 Other specified disorders of veins: Secondary | ICD-10-CM

## 2020-08-29 DIAGNOSIS — N3941 Urge incontinence: Secondary | ICD-10-CM | POA: Insufficient documentation

## 2020-08-29 DIAGNOSIS — N529 Male erectile dysfunction, unspecified: Secondary | ICD-10-CM | POA: Insufficient documentation

## 2020-08-29 DIAGNOSIS — E1165 Type 2 diabetes mellitus with hyperglycemia: Secondary | ICD-10-CM | POA: Insufficient documentation

## 2020-08-29 DIAGNOSIS — N401 Enlarged prostate with lower urinary tract symptoms: Secondary | ICD-10-CM | POA: Insufficient documentation

## 2020-09-07 ENCOUNTER — Ambulatory Visit (INDEPENDENT_AMBULATORY_CARE_PROVIDER_SITE_OTHER): Payer: Medicare Other | Admitting: Physician Assistant

## 2020-09-07 ENCOUNTER — Ambulatory Visit (HOSPITAL_COMMUNITY)
Admission: RE | Admit: 2020-09-07 | Discharge: 2020-09-07 | Disposition: A | Discharge status: Home / Self Care | Payer: Medicare Other | Source: Ambulatory Visit | Attending: Physician Assistant | Admitting: Physician Assistant

## 2020-09-07 ENCOUNTER — Other Ambulatory Visit: Payer: Self-pay

## 2020-09-07 VITALS — BP 127/72 | HR 72 | Temp 98.2°F | Resp 20 | Ht 77.0 in | Wt 265.0 lb

## 2020-09-07 DIAGNOSIS — I83019 Varicose veins of right lower extremity with ulcer of unspecified site: Secondary | ICD-10-CM

## 2020-09-07 DIAGNOSIS — I83029 Varicose veins of left lower extremity with ulcer of unspecified site: Secondary | ICD-10-CM

## 2020-09-07 DIAGNOSIS — L97919 Non-pressure chronic ulcer of unspecified part of right lower leg with unspecified severity: Secondary | ICD-10-CM | POA: Diagnosis not present

## 2020-09-07 DIAGNOSIS — I878 Other specified disorders of veins: Secondary | ICD-10-CM

## 2020-09-07 DIAGNOSIS — L97929 Non-pressure chronic ulcer of unspecified part of left lower leg with unspecified severity: Secondary | ICD-10-CM

## 2020-09-07 NOTE — Progress Notes (Signed)
Requested by:  Hilbert Corrigan, [address] [place],  [place] [id number]  Reason for consultation: Bilateral lower extremity venous stasis ulcers times approximately 6 years.  I have reviewed the patient's chart.  This is his first encounter at [facility].  He is a resident at [facility] facility.. He has been under the care of Jeri Cos, PA-C for ongoing wound care.  The notes date back to 2016 when the patient was evaluated for left lower extremity ulcer.  In 2017, there was question of whether he underwent any kind of vascular work-up.  There is mention of referral for plastic surgery evaluation in 2018 but this was evidently denied due to the fact the patient was an active smoker.  In 2019, the patient was placed in Unna boot dressings and there was question of secondary lymphedema.  In the notes dated February 25, 2020, the patient was referred to [facility] and had a series of biopsies that showed suggestion of chronic venous insufficiency.  Medical history significant for DM (insulin requiring), CHF, hypertension Maintained on aspirin and statin. On-going tobacco use. History of Present Illness   [name] is a 64 y.o. ([birth year]) male who presents for evaluation of bilateral lower extremity venous stasis ulcers.  Venous symptoms include: positive if (X) [  ] aching [  ] heavy [  ] tired  [  ] throbbing [  ] burning  [  ] itching [ x ]swelling [  ] bleeding [x  ] ulcer Onset/duration:  > 6 years  Occupation:  Resides at SNF Aggravating factors:  Alleviating factors: elevation; compressive dressings Compression:  yes Helps:  yes Pain medications:  none Previous vein procedures:  none History of DVT:   no  Past Medical History:  Diagnosis Date  . Anemia   . Arthritis    "left hip" (04/26/2015)  . Cellulitis and abscess of leg hositalized 04/25/2015   left  . Chronic hip pain   . Depression   . GERD (gastroesophageal reflux disease)   .  Hypercholesterolemia   . Hypertension   . Pneumonia 1960's X 1  . Primary skin malignancy with unknown cell type 12/10/2016  . Sleep apnea    "couldn't wear the mask" (04/26/2015)  . Thalassemia minor   . Type II diabetes mellitus ([facility])     Past Surgical History:  Procedure Laterality Date  . INGUINAL HERNIA REPAIR Right ~ 2004  . SHOULDER ARTHROSCOPY W/ ROTATOR CUFF REPAIR Right ~ 2004  . TOENAIL EXCISION Right 1980's X 2   "big toe"    Social History   Socioeconomic History  . Marital status: Married    Spouse name: Not on file  . Number of children: Not on file  . Years of education: Not on file  . Highest education level: Not on file  Occupational History  . Not on file  Tobacco Use  . Smoking status: Current Every Day Smoker    Packs/day: 0.12    Years: 45.00    Pack years: 5.40    Types: Cigarettes  . Smokeless tobacco: Never Used  Vaping Use  . Vaping Use: Never used  Substance and Sexual Activity  . Alcohol use: No    Comment: 04/26/2015 "I may drink 1/2 glass of wine or a couple beers 1-2 times/month; if that"  . Drug use: No    Comment: "stopped all drug use in 2005; S/P SARP program in [place]"  . Sexual activity: Never  Other Topics  Concern  . Not on file  Social History Narrative  . Not on file   Social Determinants of Health   Financial Resource Strain: Not on file  Food Insecurity: Not on file  Transportation Needs: Not on file  Physical Activity: Not on file  Stress: Not on file  Social Connections: Not on file  Intimate Partner Violence: Not on file    Family History  Problem Relation Age of Onset  . Diabetes Mellitus II Mother   . Diabetes Mellitus II Father   . Diabetes Mellitus II Brother     Current Outpatient Medications  Medication Sig Dispense Refill  . acetaminophen (TYLENOL) 325 MG tablet Take 2 tablets (650 mg total) by mouth every 6 (six) hours as needed for mild pain (or Fever >/= 101). 30 tablet 0  . albuterol (PROVENTIL)  (2.5 MG/3ML) 0.083% nebulizer solution Inhale 3 mLs into the lungs every 4 (four) hours as needed for shortness of breath.    . Amino Acids-Protein Hydrolys (FEEDING SUPPLEMENT, PRO-STAT SUGAR FREE 64,) LIQD Take 30 mLs by mouth 3 (three) times daily with meals.    Marland Kitchen amitriptyline (ELAVIL) 100 MG tablet Take 100 mg by mouth at bedtime.    Marland Kitchen aspirin EC 81 MG tablet Take 81 mg by mouth every morning.    Marland Kitchen atorvastatin (LIPITOR) 80 MG tablet Take 80 mg by mouth daily.    . carvedilol (COREG) 3.125 MG tablet Take 1 tablet (3.125 mg total) by mouth 2 (two) times daily with a meal.    . DULoxetine (CYMBALTA) 20 MG capsule Take 40 mg by mouth 2 (two) times daily.    . feeding supplement, ENSURE ENLIVE, (ENSURE ENLIVE) LIQD Take 237 mLs by mouth daily. 237 mL 12  . ferrous sulfate 325 (65 FE) MG tablet Take 325 mg by mouth 3 (three) times daily with meals.    . insulin aspart (NOVOLOG) 100 UNIT/ML injection Before each meal 3 times a day, 140-199 - 2 units, 200-250 - 4 units, 251-299 - 6 units,  300-349 - 8 units,  350 or above 10 units. Dispense syringes and needles as needed, Ok to switch to PEN if approved. Substitute to any brand approved. DX DM2, Code E11.65 1 vial 12  . insulin glargine (LANTUS) 100 UNIT/ML injection Inject 0.2 mLs (20 Units total) into the skin daily. 10 mL 11  . lisinopril (PRINIVIL,ZESTRIL) 2.5 MG tablet Take 1 tablet (2.5 mg total) by mouth daily.    . metFORMIN (GLUCOPHAGE) 500 MG tablet Take 500 mg by mouth daily.     . mirtazapine (REMERON) 15 MG tablet Take 15 mg by mouth at bedtime.    . Multiple Vitamin (MULTIVITAMIN WITH MINERALS) TABS tablet Take 1 tablet by mouth daily. 30 tablet 0  . Multiple Vitamins-Minerals (CENTRUM ADULTS PO) Take 1 tablet by mouth daily.     Marland Kitchen oxyCODONE (ROXICODONE) 5 MG immediate release tablet Take 1 tablet (5 mg total) by mouth every 6 (six) hours as needed for up to 12 doses for breakthrough pain. 12 tablet 0  . pantoprazole (PROTONIX) 40 MG  tablet Take 1 tablet (40 mg total) by mouth 2 (two) times daily.    . potassium chloride SA (K-DUR) 20 MEQ tablet Take 1 tablet (20 mEq total) by mouth daily.    . tamsulosin (FLOMAX) 0.4 MG CAPS capsule Take 0.4 mg by mouth daily after supper.    Marland Kitchen tiZANidine (ZANAFLEX) 4 MG tablet Take 4 mg by mouth every 8 (eight) hours as  needed for muscle spasms.    Marland Kitchen torsemide (DEMADEX) 20 MG tablet Take 1 tablet (20 mg total) by mouth daily.     No current facility-administered medications for this visit.    Allergies  Allergen Reactions  . Other     Pt is a Jehovah Witness. No blood products.  . Sulfa Antibiotics     Causes flu-like symptom : sweating, chills, fever, body aches    REVIEW OF SYSTEMS (negative unless checked):   Cardiac:  []  Chest pain or chest pressure? []  Shortness of breath upon activity? []  Shortness of breath when lying flat? []  Irregular heart rhythm?  Vascular:  []  Pain in calf, thigh, or hip brought on by walking? []  Pain in feet at night that wakes you up from your sleep? []  Blood clot in your veins? [x]  Leg swelling?  Pulmonary:  []  Oxygen at home? []  Productive cough? []  Wheezing?  Neurologic:  []  Sudden weakness in arms or legs? []  Sudden numbness in arms or legs? []  Sudden onset of difficult speaking or slurred speech? []  Temporary loss of vision in one eye? []  Problems with dizziness?  Gastrointestinal:  []  Blood in stool? []  Vomited blood?  Genitourinary:  []  Burning when urinating? []  Blood in urine?  Psychiatric:  []  Major depression  Hematologic:  []  Bleeding problems? []  Problems with blood clotting?  Dermatologic:  []  Rashes or ulcers?  Constitutional:  []  Fever or chills?  Ear/Nose/Throat:  []  Change in hearing? []  Nose bleeds? []  Sore throat?  Musculoskeletal:  []  Back pain? []  Joint pain? []  Muscle pain?   Physical Examination      Vitals:   09/07/20 1321  BP: 127/72  Pulse: 72  Resp: 20  Temp: 98.2 F  (36.8 C)  SpO2: 90%   General:  WDWN in NAD; vital signs documented above Gait: Not observed HENT: WNL, normocephalic Pulmonary: normal non-labored breathing , without Rales, rhonchi,  wheezing Cardiac: regular HR,  Skin: with rashes Pulses: 2+ right DP pulse; weakly palpable left DP pulse Extremities: without varicose veins, without reticular veins, with edema, with stasis pigmentation, with lipodermatosclerosis, with ulcers Musculoskeletal: no muscle wasting or atrophy  Neurologic: A&O X 3;  No focal weakness or paresthesias are detected Psychiatric:  The pt has Normal affect.        Non-invasive Vascular Imaging   BLE Venous Insufficiency Duplex (09/07/2020):   RLE:   negative DVT and SVT,   negative GSV reflux   GSV diameter   negative SSV reflux   negative deep venous reflux   LLE:  negative DVT and SVT,   negative GSV reflux    GSV diameter   negative SSV reflux   negative deep venous reflux   Medical Decision Making   Reagen Haberman is a 64 y.o. male who presents with: BLE chronic venous insufficiency with bilateral lower extremity venous stasis ulcers.  There is no evidence today of deep venous thrombosis, superficial venous thrombosis or venous reflux.   Based on the patient's history and examination, I recommend: continue aggressive wound care.  Continue compression and elevation  No indication for further venous work-up or intervention.  Follow-up as needed.  Thank you for allowing [place] to participate in this patient's care.   Barbie Banner, PA-C [facility] Office: [phone]  09/07/2020, 12:53 PM  Clinic MD: [place]

## 2020-09-11 ENCOUNTER — Encounter (HOSPITAL_BASED_OUTPATIENT_CLINIC_OR_DEPARTMENT_OTHER): Payer: Medicare Other | Attending: Internal Medicine | Admitting: Internal Medicine

## 2020-09-11 ENCOUNTER — Other Ambulatory Visit: Payer: Self-pay

## 2020-09-11 DIAGNOSIS — L97222 Non-pressure chronic ulcer of left calf with fat layer exposed: Secondary | ICD-10-CM | POA: Insufficient documentation

## 2020-09-11 DIAGNOSIS — E1151 Type 2 diabetes mellitus with diabetic peripheral angiopathy without gangrene: Secondary | ICD-10-CM | POA: Diagnosis not present

## 2020-09-11 DIAGNOSIS — L97212 Non-pressure chronic ulcer of right calf with fat layer exposed: Secondary | ICD-10-CM | POA: Insufficient documentation

## 2020-09-11 DIAGNOSIS — I110 Hypertensive heart disease with heart failure: Secondary | ICD-10-CM | POA: Insufficient documentation

## 2020-09-11 DIAGNOSIS — I509 Heart failure, unspecified: Secondary | ICD-10-CM | POA: Diagnosis not present

## 2020-09-11 DIAGNOSIS — I890 Lymphedema, not elsewhere classified: Secondary | ICD-10-CM | POA: Diagnosis not present

## 2020-09-11 DIAGNOSIS — I87333 Chronic venous hypertension (idiopathic) with ulcer and inflammation of bilateral lower extremity: Secondary | ICD-10-CM | POA: Diagnosis not present

## 2020-09-11 NOTE — Progress Notes (Signed)
Monterosso, [place] ([id number]) Visit Report for 09/11/2020 HPI Details [name], [name] 09/11/2020 12:30 PM Medical Record Number: [id number] Patient Account Number: [id number] Date of Birth/Sex: Treating RN: [birth year] (64 y.o. [name] Primary Care Provider: [name] Other Clinician: Referring Provider: Treating Provider/Extender: [name] in Treatment: 28 History of Present Illness HPI Description: 07/07/15; this is a patient who is in hospital on 8/2 through 8/4. He had cellulitis and abscess of predominantly I think the left leg. He received IV antibiotics. Plain x-ray showed no osteomyelitis. An MRI of the left leg did not show osteomyelitis. Cultures showed no predominant organism. His hemoglobin A1c was 9.8. He has a history of venous stasis also peripheral vascular disease. He was discharged to [place] home. He really has extensive ulcerations on the left lateral leg including a major wound that communicates both posteriorly and superiorly w. When drainage coming out of 2 smaller areas. He has a smaller wound on the posterior medial left leg. He has more predominantly venous insufficiency wounds on predominantly the right medial leg above the ankle the right foot into sections. He has recently been put on Augmentin at the nursing home. Previous ABIs/arterial evaluation showed triphasic waves diffusely he does not have a major ischemic issue a left lower extremity venous duplex also exam showed no evidence of a DVT on 6/21 07/21/15; the patient arrives with really no major change. Culture I did last time was negative. He has not had a follow-up MRI I ordered. The wounds are macerated covered with a thick gelatinous surface slough. There is necrotic subcutaneous tissue 08/04/15; the patient arrives with a considerable improvement in the majority of his wound area. The area on the left leg now has what looks  to be a granulated base. Most of the wounds on the left leg required an aggressive surgical debridement to remove nonviable fibrinous eschar and subcutaneous tissue however after debridement most of this looks better. Although I had asked for repeat MRI of the left leg when he first came in here with grossly purulent material coming out of his wounds, it does not appear that this is been done and nor do I actually feel that strongly about it right now. He has severe surrounding venous insufficiency and inflammation. I don't believe he has significant PAD 08/25/15. In general there is still a considerable wound area here but with still extensive service adherent slough. The patient will not allow mechanical debridement due to pain. He did not tolerate Medihoney therefore we are left with Santyl for now. She would appear that he has severe surrounding venous stasis. There is no evidence of the infection that may have had something to do with the pathogenesis of these wounds 09/29/15; patient still has substantial wound area on the left leg with a cluster of several wounds on the lateral leg confluently on the left leg posteriorly and then a substantial wound on the medial leg. On the right leg a substantial wound medially and a small area on the right lateral foot. All of these underwent a substantial surgical debridement with curettes which she tolerated better than he has in the past. This started as a complex cellulitis in the face of chronic venous insufficiency and inflammation. 10/13/15; substantial cluster of wounds on the lateral aspect of his left leg confluently around to the other side. Extensive surgical debridement to remove for redness surface slough nonviable subcutaneous tissue. This is not an improvement. Also  substantial wound on the medial right leg which is largely unchanged. The etiology of this was felt to be a complex cellulitis in the summer of 2016 in the face of chronic venous  insufficiency and inflammation. The patient states that the wound on the right medial leg has been there for years off and on. 10/20/15; we are able to start [facility] to these extensive wound areas left greater than right on Tuesday after I spoke to the wound care nurse at the facility. He arrives here today for extensive surgical debridement for the wounds on the lateral aspect of his left leg posterior left leg, this is almost circumferential. He has a large wound on the medial aspect of his right leg although he finds this too painful for debridement 10/27/15; I continue to bring this patient back for frequent debridement/weekly debridement severe. We have been using Hydrofera Blue. Unfortunately this has not really had any improvement. The debridement surgery difficult and painful for the patient 11/23/15; this patient spent a complex hospitalization admitted with acute kidney injury, anemia, cellulitis of the lower extremity, he was felt to have sepsis pathophysiology although his blood cultures were negative. He had plain x-rays of both legs that were negative for osseous abnormalities. He was seen by infectious disease and placed on a workup for vasculitis that was negative including a biopsy 4 all were consistent with stasis dermatitis. Vital broad- spectrum antibiotics he continued to spike fevers. Urine and chest x-ray were negative Dopplers on admission ruled out a DVT All antibiotics were stopped on . 2/17. Since his return to [facility] skilled nursing facility I believe they have been using Xeroform 12/07/15 the patient arrives today with the area on his right medial leg actually looking quite stable. No debridement. The rest of his extensive wounds on the left lateral left posterior extending into the left medial leg all required extensive debridement. I think this is probably going to need to and up in the hands of plastic surgery. We'll attempt to change him back to [facility].  Arrange consultation with plastic surgery at [facility] for this almost circumferential wound on the left side 12/21/15 right leg covered in surface slough. This was debridement. Left leg extensive wounds all carefully examined. This is almost circumferential on the left side especially on the posterior calf. No debridement is necessary. 01/04/16 the patient has been to [facility] plastic surgery. Unfortunately it doesn't look like they had any of the prior workup on this patient. They're in the middle of vascular workup. It sounds as though they're applying Hydrofera Blue at the nursing home. 01/22/16; the patient has been back to see [facility]. There apparently making plans to possibly do skin grafts over his large venous insufficiency ulcerations. The patient tells me that he goes to hematology in [facility] and variably uses the term platelets red cells and the description of his problem. 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 I do not see any major difference in the wound on the right medial leg and circumferentially across the left medial to left lateral leg. Did not attempt to debride these today. Readmission: 05/05/18 on evaluation today patient presents for reevaluation here in our clinic although I have previously seen him in the skilled nursing facility over the past year and a half when I was working in the skilled nursing facility realm instead of covering in the clinic. With that being said during that time we had initiated most  recently dressings with Hydrofera Blue Dressing along with the [employer] which had done very well for him. Much better than the Kerlex Coban wraps. With that being said the patient had been doing fairly well and was making progress when I last saw him. Upon evaluation today it appears that the wounds are actually a little bit worse than when I last saw him although definitely not dramatically so. There does not appear  to be any evidence of infection which is good news. With that being said he has been tolerating the wraps without complication his left hip still gives him a lot of trouble which is his main issue as far as movement is concerned. Unbeknownst to me he has not had venous studies that I can find. He previously told me he had there were arterial studies noted back from 04/27/15 which showed that he had try phasic blood flow throughout and again his test appear to be completely normal as performed by Dr. Creig Hines. With that being said I cannot find where he had venous studies. The patient still states he thinks he did these definitely had no venous intervention at this point. Upon evaluation today it does not appear to me that the patient has any evidence of cellulitis. He does have some chronic venous insufficiency which I think has led to stasis dermatitis but again this is not appearing to be infected at this point. No fevers, chills, nausea, or vomiting noted at this time. READMISSION 06/30/2018 This is a now 64 year old man we have had in this clinic at multiple times in the past. Most recently he came in August and was seen by [facility] stone. It is not clear why he did not come back we asked him really did not get a straight answer. He is at [facility] skilled facility he is a man who has severe chronic venous insufficiency with lymphedema and has had severe wounds on his left greater than right leg. We had him here in 2016 and 17 ultimately referred him to [facility]. He had arterial studies and venous studies done at [facility] although I am not able to access these records I see they are actually done. He also had multiple biopsies that were negative for malignancy showing changes compatible with chronic inflammation. As I understand things in [place] they are using Iodoflex and Unna boots. I am not really sure how they are getting enough Iodoflex for the large wound area especially on his left  calf. Nevertheless the wounds look better than when I saw this in 2017. There were plans for him to have plastic surgery in 2018 and I think he was actually prepped for surgery however he was ultimately denied because the patient was an active smoker. The patient is not systemically unwell. The wounds are painful however given the size especially on the left this is not surprising. ABIs in this clinic were 0.78 on the left and 0.91 on the right 07/13/2018; this is a patient with bilateral severe venous ulcers with secondary lymphedema. The wounds are on the right medial calf and a substantial part of the left posterior calf and some involvement medially and laterally on the left. We changed him to silver alginate under Unna boot's last time. The wounds actually look fairly satisfactory today. 08/14/2018; this is a patient with severe bilateral venous stasis ulcers with secondary lymphedema left greater than right he is at [facility] using silver alginate under Unna boot's I do not think there is  much change on this visit versus last time I saw him a month ago Readmission: 11/04/18 patient presents today for follow-up concerning his bilateral lower extremity ulcers. His a right medial ankle ulcer and a left leg that has ulcers over a large proportion of the surface area between the ankle and knee. Unfortunately this does cause some discomfort for the patient although it doesn't seem to be as uncomfortable as it has been in the past. He is seen today for evaluation after a referral by [place] back to the clinic. Unfortunately has a lot of Slough noted on the surface of his wounds. He's been treated currently it sounds like with Dakin's soaked gauze and they have not been performing the Unna Boot wrap that was previously recommended. I'm not exactly sure what the reason for the change was. He does have less slough on the surface of the wound but again this is something that is often a constant issue for  him. Again he's had these wounds for many years. I've known him for two years at least during that time when I was taking care of them in the facility he is Artie had the wounds for several years prior. READMISSION 02/25/2020 [name] is a now 64 year old man. He has been in our clinic several times before most extensively in October 2016 through May 2017. At that time he had absolutely substantial bilateral lower extremity wounds secondary to chronic venous insufficiency and lymphedema. We ultimately referred him to [facility] he had a series of biopsies only showed suggestion of wound secondary to chronic venous insufficiency. He had a less extensive stay in our clinic in 2019 and then a single visit in February 2020. He is a resident at [facility] skilled facility. I think he has had intermittently had in facility wound care. He returns today. They are using silver alginate on the wounds although I am not sure what type of compression. Previously he has favored Unna boots. He is not felt to have an arterial issue. Past medical history; lymphedema and chronic venous insufficiency, diabetes mellitus, hypertension, congestive heart failure We did not do arterial studies today 04/27/2020 on evaluation patient appears to be doing really about the same as when I have known him and seen him in the past. He does have wounds on the right and left legs at this point. Fortunately there is no signs of active infection at this time. 9/2; 1 month follow-up. This is a man with severe chronic venous insufficiency and lymphedema. When I first met him he had horrible circumferential bilateral lower extremity ulcers. We have been using silver alginate up until early last month when it was changed to [facility] and Unna boot's more or less says maintenance dressings. Since the last time he was here the facility where he resides [facility] called to report they could no longer do Unna boot so he is apparently only  been receiving kerlix Coban the left leg is certainly a lot worse with almost circumferential large wounds especially lateral but now medial and posterior as well. He has a standard same looking wound on the right medial lower leg. 10/1; absolutely no improvement here. In fact I do not think anything much is changed. The substantial area on his left lateral and left posterior calf is still open may be slightly larger. He has a more modest wound on the right medial calf. None of these look any different. He comes in with a kerlix Coban wrap this will simply not  be adequate. He has too much edema in this leg He also is complaining a lot of pain in his left heel area. 11/8; potential wounds on the left lateral and posterior calf and a smaller oval wound in the right medial. We have been using Hydrofera Blue under 4-layer compression. He was using Unna boots still 2 months ago the facility called to say they can no longer place these so we have been using 4-layer compression. The surface area of the area on the left is so large there is very few alternatives to what we can use on this either silver alginate or Hydrofera Blue. He was complaining of pain on the left heel last time I asked for an x-ray this was apparently done although we do not have the result. He is not complaining of pain today. 08/16/2020 on evaluation today patient is is seen for a early visit due to the fact that he apparently is having issues I was told when they called on Monday with a MRSA infection that they felt like we needed to see him for because his legs "looked horrible". With that being said based on what I see at this point it does not appear that the patient is actually having a terrible infection in fact compared to last time I saw him his legs do not appear to be doing too poorly at all at this time. Nonetheless he has been on doxycycline that is not going to be a good option for him based on the culture report that I  have for review today which graded as a partial report with still several organisms pending as far as identification is concerned we did contact them but again they do not have the final report yet. Either way based on what we see it appears that doxycycline is one of the few medications that will not work for the Citrobacter. Obviously I think that a different medication would be a good option for him since there is a gram-negative organism pending I am likely can I suggest Cipro as the best of backup antibiotic to switch him to at this point. All this was discussed with the patient today. 12/20; not much change in the substantial wound on the left posterior calf and the small area on the right medial. He has a new area on the left anterior which looks like a wrap injury. Had some thoughts about doing a deep tissue culture here although we will put that off for next time. Using silver alginate as a primary dressing Electronic Signature(s) Signed: 09/11/2020 7:23:52 PM By: Linton Ham MD Entered By: Linton Ham on 09/11/2020 13:43:34 -------------------------------------------------------------------------------- Physical Exam Details [name], [name] 09/11/2020 12:30 PM Medical Record Number: [id number] Patient Account Number: [id number] Date of Birth/Sex: Treating RN: Dec 14, 1955 (65 y.o. [name] Primary Care Provider: [name] Other Clinician: Referring Provider: Treating Provider/Extender: [name] in Treatment: 28 Constitutional Patient is hypertensive.. Pulse regular and within target range for patient.Marland Kitchen Respirations regular, non-labored and within target range.. Temperature is normal and within the target range for the patient.Marland Kitchen Appears in no distress. Cardiovascular Pedal pulses are palpable. Edema looks well controlled. Notes Wound exam; I do not see any evidence of infection here substantial wound  on the left posterior calf extending medially and laterally. Large area on the right medial lower leg and he has a new area on the left anterior which looks like a wrap injury. No obvious infection  Electronic Signature(s) Signed: 09/11/2020 7:23:52 PM By: Linton Ham MD Entered By: Linton Ham on 09/11/2020 13:45:37 -------------------------------------------------------------------------------- Physician Orders Details [name], [name] 09/11/2020 12:30 PM Medical Record Number: [id number] Patient Account Number: [id number] Date of Birth/Sex: Treating RN: 1956/08/07 (64 y.o. [name] Primary Care Provider: [name] Other Clinician: Referring Provider: Treating Provider/Extender: [name] in Treatment: 28 Verbal / Phone Orders: No Diagnosis Coding ICD-10 Coding Code Description [phone] Chronic venous hypertension (idiopathic) with ulcer and inflammation of bilateral lower extremity I89.0 Lymphedema, not elsewhere classified L97.222 Non-pressure chronic ulcer of left calf with fat layer exposed L97.212 Non-pressure chronic ulcer of right calf with fat layer exposed Follow-up Appointments ppointment in: - 6 weeks Return A Bathing/ Shower/ Hygiene May shower with protection but do not get wound dressing(s) wet. Edema Control - Lymphedema / SCD / Other Elevate legs to the level of the heart or above for 30 minutes daily and/or when sitting, a frequency of: - throughout the day Avoid standing for long periods of time. Wound Treatment Wound #21 - Lower Leg Wound Laterality: Left, Distal Peri-Wound Care: Zinc Oxide Ointment 30g tube 3 x Per Week/30 Days Discharge Instructions: Apply Zinc Oxide to periwound with each dressing change Peri-Wound Care: Sween Lotion (Moisturizing lotion) 3 x Per Week/30 Days Discharge Instructions: Apply moisturizing lotion as directed Prim Dressing: KerraCel Ag Gelling Fiber  Dressing, 4x5 in (silver alginate) 3 x Per Week/30 Days ary Discharge Instructions: Apply silver alginate to wound bed as instructed Secondary Dressing: Woven Gauze Sponge, Non-Sterile 4x4 in 3 x Per Week/30 Days Discharge Instructions: Apply over primary dressing as directed. Secondary Dressing: ABD Pad, 8x10 3 x Per Week/30 Days Discharge Instructions: Apply over primary dressing as directed. Compression Wrap: FourPress (4 layer compression wrap) 3 x Per Week/30 Days Discharge Instructions: Apply four layer compression as directed. Wound #22 - Lower Leg Wound Laterality: Right, Medial Peri-Wound Care: Zinc Oxide Ointment 30g tube 3 x Per Week/30 Days Discharge Instructions: Apply Zinc Oxide to periwound with each dressing change Peri-Wound Care: Sween Lotion (Moisturizing lotion) 3 x Per Week/30 Days Discharge Instructions: Apply moisturizing lotion as directed Prim Dressing: KerraCel Ag Gelling Fiber Dressing, 4x5 in (silver alginate) 3 x Per Week/30 Days ary Discharge Instructions: Apply silver alginate to wound bed as instructed Secondary Dressing: Woven Gauze Sponge, Non-Sterile 4x4 in 3 x Per Week/30 Days Discharge Instructions: Apply over primary dressing as directed. Secondary Dressing: ABD Pad, 8x10 3 x Per Week/30 Days Discharge Instructions: Apply over primary dressing as directed. Compression Wrap: FourPress (4 layer compression wrap) 3 x Per Week/30 Days Discharge Instructions: Apply four layer compression as directed. Wound #23 - Lower Leg Wound Laterality: Left, Medial Peri-Wound Care: Zinc Oxide Ointment 30g tube 3 x Per Week/30 Days Discharge Instructions: Apply Zinc Oxide to periwound with each dressing change Peri-Wound Care: Sween Lotion (Moisturizing lotion) 3 x Per Week/30 Days Discharge Instructions: Apply moisturizing lotion as directed Prim Dressing: KerraCel Ag Gelling Fiber Dressing, 4x5 in (silver alginate) 3 x Per Week/30 Days ary Discharge Instructions:  Apply silver alginate to wound bed as instructed Secondary Dressing: Woven Gauze Sponge, Non-Sterile 4x4 in 3 x Per Week/30 Days Discharge Instructions: Apply over primary dressing as directed. Secondary Dressing: ABD Pad, 8x10 3 x Per Week/30 Days Discharge Instructions: Apply over primary dressing as directed. Compression Wrap: FourPress (4 layer compression wrap) 3 x Per Week/30 Days Discharge Instructions: Apply four layer compression as directed. Electronic Signature(s) Signed: 09/11/2020  5:38:15 PM By: Levan Hurst RN, BSN Signed: 09/11/2020 7:23:52 PM By: Linton Ham MD Entered By: Levan Hurst on 09/11/2020 13:26:30 -------------------------------------------------------------------------------- Problem List Details [name], [name] 09/11/2020 12:30 PM Medical Record Number: [id number] Patient Account Number: [id number] Date of Birth/Sex: Treating RN: 07/18/1956 (64 y.o. [name] Primary Care Provider: [name] Other Clinician: Referring Provider: Treating Provider/Extender: [name] in Treatment: 28 Active Problems ICD-10 Encounter Code Description Active Date MDM Diagnosis I87.333 Chronic venous hypertension (idiopathic) with ulcer and inflammation of 02/25/2020 No Yes bilateral lower extremity I89.0 Lymphedema, not elsewhere classified 02/25/2020 No Yes L97.222 Non-pressure chronic ulcer of left calf with fat layer exposed 02/25/2020 No Yes L97.212 Non-pressure chronic ulcer of right calf with fat layer exposed 02/25/2020 No Yes Inactive Problems Resolved Problems Electronic Signature(s) Signed: 09/11/2020 7:23:52 PM By: Linton Ham MD Entered By: Linton Ham on 09/11/2020 13:42:31 -------------------------------------------------------------------------------- Progress Note Details [name], [name] 09/11/2020 12:30 PM Medical Record Number:  [id number] Patient Account Number: [id number] Date of Birth/Sex: Treating RN: 1955-10-22 (64 y.o. [name] Primary Care Provider: [name] Other Clinician: Referring Provider: Treating Provider/Extender: [name] in Treatment: 28 Subjective History of Present Illness (HPI) 07/07/15; this is a patient who is in hospital on 8/2 through 8/4. He had cellulitis and abscess of predominantly I think the left leg. He received IV antibiotics. Plain x-ray showed no osteomyelitis. An MRI of the left leg did not show osteomyelitis. Cultures showed no predominant organism. His hemoglobin A1c was 9.8. He has a history of venous stasis also peripheral vascular disease. He was discharged to [place] home. He really has extensive ulcerations on the left lateral leg including a major wound that communicates both posteriorly and superiorly w. When drainage coming out of 2 smaller areas. He has a smaller wound on the posterior medial left leg. He has more predominantly venous insufficiency wounds on predominantly the right medial leg above the ankle the right foot into sections. He has recently been put on Augmentin at the nursing home. Previous ABIs/arterial evaluation showed triphasic waves diffusely he does not have a major ischemic issue a left lower extremity venous duplex also exam showed no evidence of a DVT on 6/21 07/21/15; the patient arrives with really no major change. Culture I did last time was negative. He has not had a follow-up MRI I ordered. The wounds are macerated covered with a thick gelatinous surface slough. There is necrotic subcutaneous tissue 08/04/15; the patient arrives with a considerable improvement in the majority of his wound area. The area on the left leg now has what looks to be a granulated base. Most of the wounds on the left leg required an aggressive surgical debridement to remove nonviable fibrinous eschar and subcutaneous  tissue however after debridement most of this looks better. Although I had asked for repeat MRI of the left leg when he first came in here with grossly purulent material coming out of his wounds, it does not appear that this is been done and nor do I actually feel that strongly about it right now. He has severe surrounding venous insufficiency and inflammation. I don't believe he has significant PAD 08/25/15. In general there is still a considerable wound area here but with still extensive service adherent slough. The patient will not allow mechanical debridement due to pain. He did not tolerate Medihoney therefore we are left with Santyl for now.  She would appear that he has severe surrounding venous stasis. There is no evidence of the infection that may have had something to do with the pathogenesis of these wounds 09/29/15; patient still has substantial wound area on the left leg with a cluster of several wounds on the lateral leg confluently on the left leg posteriorly and then a substantial wound on the medial leg. On the right leg a substantial wound medially and a small area on the right lateral foot. All of these underwent a substantial surgical debridement with curettes which she tolerated better than he has in the past. This started as a complex cellulitis in the face of chronic venous insufficiency and inflammation. 10/13/15; substantial cluster of wounds on the lateral aspect of his left leg confluently around to the other side. Extensive surgical debridement to remove for redness surface slough nonviable subcutaneous tissue. This is not an improvement. Also substantial wound on the medial right leg which is largely unchanged. The etiology of this was felt to be a complex cellulitis in the summer of 2016 in the face of chronic venous insufficiency and inflammation. The patient states that the wound on the right medial leg has been there for years off and on. 10/20/15; we are able to start  [facility] to these extensive wound areas left greater than right on Tuesday after I spoke to the wound care nurse at the facility. He arrives here today for extensive surgical debridement for the wounds on the lateral aspect of his left leg posterior left leg, this is almost circumferential. He has a large wound on the medial aspect of his right leg although he finds this too painful for debridement 10/27/15; I continue to bring this patient back for frequent debridement/weekly debridement severe. We have been using Hydrofera Blue. Unfortunately this has not really had any improvement. The debridement surgery difficult and painful for the patient 11/23/15; this patient spent a complex hospitalization admitted with acute kidney injury, anemia, cellulitis of the lower extremity, he was felt to have sepsis pathophysiology although his blood cultures were negative. He had plain x-rays of both legs that were negative for osseous abnormalities. He was seen by infectious disease and placed on a workup for vasculitis that was negative including a biopsy o4 all were consistent with stasis dermatitis. Vital broad-spectrum antibiotics he continued to spike fevers. Urine and chest x-ray were negative Dopplers on admission ruled out a DVT All antibiotics were stopped on 2/17. . Since his return to [facility] skilled nursing facility I believe they have been using Xeroform 12/07/15 the patient arrives today with the area on his right medial leg actually looking quite stable. No debridement. The rest of his extensive wounds on the left lateral left posterior extending into the left medial leg all required extensive debridement. I think this is probably going to need to and up in the hands of plastic surgery. We'll attempt to change him back to [facility]. Arrange consultation with plastic surgery at [facility] for this almost circumferential wound on the left side 12/21/15 right leg covered in surface slough. This  was debridement. Left leg extensive wounds all carefully examined. This is almost circumferential on the left side especially on the posterior calf. No debridement is necessary. 01/04/16 the patient has been to [facility] plastic surgery. Unfortunately it doesn't look like they had any of the prior workup on this patient. They're in the middle of vascular workup. It sounds as though they're applying Hydrofera Blue at the nursing home.  01/22/16; the patient has been back to see [facility]. There apparently making plans to possibly do skin grafts over his large venous insufficiency ulcerations. The patient tells me that he goes to hematology in [facility] and variably uses the term platelets red cells and the description of his problem. 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 I do not see any major difference in the wound on the right medial leg and circumferentially across the left medial to left lateral leg. Did not attempt to debride these today. Readmission: 05/05/18 on evaluation today patient presents for reevaluation here in our clinic although I have previously seen him in the skilled nursing facility over the past year and a half when I was working in the skilled nursing facility realm instead of covering in the clinic. With that being said during that time we had initiated most recently dressings with Hydrofera Blue Dressing along with the [employer] which had done very well for him. Much better than the Kerlex Coban wraps. With that being said the patient had been doing fairly well and was making progress when I last saw him. Upon evaluation today it appears that the wounds are actually a little bit worse than when I last saw him although definitely not dramatically so. There does not appear to be any evidence of infection which is good news. With that being said he has been tolerating the wraps without complication his left hip still gives him a  lot of trouble which is his main issue as far as movement is concerned. Unbeknownst to me he has not had venous studies that I can find. He previously told me he had there were arterial studies noted back from 04/27/15 which showed that he had try phasic blood flow throughout and again his test appear to be completely normal as performed by Dr. Creig Hines. With that being said I cannot find where he had venous studies. The patient still states he thinks he did these definitely had no venous intervention at this point. Upon evaluation today it does not appear to me that the patient has any evidence of cellulitis. He does have some chronic venous insufficiency which I think has led to stasis dermatitis but again this is not appearing to be infected at this point. No fevers, chills, nausea, or vomiting noted at this time. READMISSION 06/30/2018 This is a now 64 year old man we have had in this clinic at multiple times in the past. Most recently he came in August and was seen by [facility] stone. It is not clear why he did not come back we asked him really did not get a straight answer. He is at [facility] skilled facility he is a man who has severe chronic venous insufficiency with lymphedema and has had severe wounds on his left greater than right leg. We had him here in 2016 and 17 ultimately referred him to [facility]. He had arterial studies and venous studies done at [facility] although I am not able to access these records I see they are actually done. He also had multiple biopsies that were negative for malignancy showing changes compatible with chronic inflammation. As I understand things in [place] they are using Iodoflex and Unna boots. I am not really sure how they are getting enough Iodoflex for the large wound area especially on his left calf. Nevertheless the wounds look better than when I saw this in 2017. There were plans for him to have  plastic surgery in 2018 and I think he was actually  prepped for surgery however he was ultimately denied because the patient was an active smoker. The patient is not systemically unwell. The wounds are painful however given the size especially on the left this is not surprising. ABIs in this clinic were 0.78 on the left and 0.91 on the right 07/13/2018; this is a patient with bilateral severe venous ulcers with secondary lymphedema. The wounds are on the right medial calf and a substantial part of the left posterior calf and some involvement medially and laterally on the left. We changed him to silver alginate under Unna boot's last time. The wounds actually look fairly satisfactory today. 08/14/2018; this is a patient with severe bilateral venous stasis ulcers with secondary lymphedema left greater than right he is at [facility] using silver alginate under Unna boot's I do not think there is much change on this visit versus last time I saw him a month ago Readmission: 11/04/18 patient presents today for follow-up concerning his bilateral lower extremity ulcers. His a right medial ankle ulcer and a left leg that has ulcers over a large proportion of the surface area between the ankle and knee. Unfortunately this does cause some discomfort for the patient although it doesn't seem to be as uncomfortable as it has been in the past. He is seen today for evaluation after a referral by [place] back to the clinic. Unfortunately has a lot of Slough noted on the surface of his wounds. He's been treated currently it sounds like with Dakin's soaked gauze and they have not been performing the Unna Boot wrap that was previously recommended. I'm not exactly sure what the reason for the change was. He does have less slough on the surface of the wound but again this is something that is often a constant issue for him. Again he's had these wounds for many years. I've known him for two years at least during that time when I was taking care of them in the facility he is  Artie had the wounds for several years prior. READMISSION 02/25/2020 [name] is a now 64 year old man. He has been in our clinic several times before most extensively in October 2016 through May 2017. At that time he had absolutely substantial bilateral lower extremity wounds secondary to chronic venous insufficiency and lymphedema. We ultimately referred him to [facility] he had a series of biopsies only showed suggestion of wound secondary to chronic venous insufficiency. He had a less extensive stay in our clinic in 2019 and then a single visit in February 2020. He is a resident at [facility] skilled facility. I think he has had intermittently had in facility wound care. He returns today. They are using silver alginate on the wounds although I am not sure what type of compression. Previously he has favored Unna boots. He is not felt to have an arterial issue. Past medical history; lymphedema and chronic venous insufficiency, diabetes mellitus, hypertension, congestive heart failure We did not do arterial studies today 04/27/2020 on evaluation patient appears to be doing really about the same as when I have known him and seen him in the past. He does have wounds on the right and left legs at this point. Fortunately there is no signs of active infection at this time. 9/2; 1 month follow-up. This is a man with severe chronic venous insufficiency and lymphedema. When I first met him he had horrible circumferential bilateral lower extremity ulcers. We have been using  silver alginate up until early last month when it was changed to [employer] and Unna boot's more or less says maintenance dressings. Since the last time he was here the facility where he resides [facility] called to report they could no longer do Unna boot so he is apparently only been receiving kerlix Coban the left leg is certainly a lot worse with almost circumferential large wounds especially lateral but now medial and posterior as  well. He has a standard same looking wound on the right medial lower leg. 10/1; absolutely no improvement here. In fact I do not think anything much is changed. The substantial area on his left lateral and left posterior calf is still open may be slightly larger. He has a more modest wound on the right medial calf. None of these look any different. He comes in with a kerlix Coban wrap this will simply not be adequate. He has too much edema in this leg He also is complaining a lot of pain in his left heel area. 11/8; potential wounds on the left lateral and posterior calf and a smaller oval wound in the right medial. We have been using Hydrofera Blue under 4-layer compression. He was using Unna boots still 2 months ago the facility called to say they can no longer place these so we have been using 4-layer compression. The surface area of the area on the left is so large there is very few alternatives to what we can use on this either silver alginate or Hydrofera Blue. He was complaining of pain on the left heel last time I asked for an x-ray this was apparently done although we do not have the result. He is not complaining of pain today. 08/16/2020 on evaluation today patient is is seen for a early visit due to the fact that he apparently is having issues I was told when they called on Monday with a MRSA infection that they felt like we needed to see him for because his legs "looked horrible". With that being said based on what I see at this point it does not appear that the patient is actually having a terrible infection in fact compared to last time I saw him his legs do not appear to be doing too poorly at all at this time. Nonetheless he has been on doxycycline that is not going to be a good option for him based on the culture report that I have for review today which graded as a partial report with still several organisms pending as far as identification is concerned we did contact them but again  they do not have the final report yet. Either way based on what we see it appears that doxycycline is one of the few medications that will not work for the Citrobacter. Obviously I think that a different medication would be a good option for him since there is a gram-negative organism pending I am likely can I suggest Cipro as the best of backup antibiotic to switch him to at this point. All this was discussed with the patient today. 12/20; not much change in the substantial wound on the left posterior calf and the small area on the right medial. He has a new area on the left anterior which looks like a wrap injury. Had some thoughts about doing a deep tissue culture here although we will put that off for next time. Using silver alginate as a primary dressing Objective Constitutional Patient is hypertensive.. Pulse regular and within target range  for patient.Marland Kitchen Respirations regular, non-labored and within target range.. Temperature is normal and within the target range for the patient.Marland Kitchen Appears in no distress. Vitals Time Taken: 1:05 PM, Height: 77 in, Weight: 254 lbs, BMI: 30.1, Temperature: 98 F, Pulse: 80 bpm, Respiratory Rate: 17 breaths/min, Blood Pressure: 150/85 mmHg, Capillary Blood Glucose: 168 mg/dl. Cardiovascular Pedal pulses are palpable. Edema looks well controlled. General Notes: Wound exam; I do not see any evidence of infection here substantial wound on the left posterior calf extending medially and laterally. Large area on the right medial lower leg and he has a new area on the left anterior which looks like a wrap injury. No obvious infection Integumentary (Hair, Skin) Wound #21 status is Open. Original cause of wound was Gradually Appeared. The wound is located on the Left,Distal Lower Leg. The wound measures 16.5cm length x 19cm width x 0.9cm depth; 246.222cm^2 area and 221.6cm^3 volume. There is Fat Layer (Subcutaneous Tissue) exposed. There is no tunneling or undermining  noted. There is a large amount of serosanguineous drainage noted. The wound margin is distinct with the outline attached to the wound base. There is medium (34-66%) pink granulation within the wound bed. There is a medium (34-66%) amount of necrotic tissue within the wound bed including Adherent Slough. Wound #22 status is Open. Original cause of wound was Gradually Appeared. The wound is located on the Right,Medial Lower Leg. The wound measures 4.5cm length x 3.3cm width x 0.2cm depth; 11.663cm^2 area and 2.333cm^3 volume. There is Fat Layer (Subcutaneous Tissue) exposed. There is no tunneling or undermining noted. There is a medium amount of serosanguineous drainage noted. The wound margin is flat and intact. There is medium (34-66%) pink granulation within the wound bed. There is a medium (34-66%) amount of necrotic tissue within the wound bed including Adherent Slough. Wound #23 status is Open. Original cause of wound was Trauma. The wound is located on the Left,Medial Lower Leg. The wound measures 1cm length x 2.5cm width x 0.1cm depth; 1.963cm^2 area and 0.196cm^3 volume. There is no tunneling or undermining noted. There is a medium amount of sanguinous drainage noted. The wound margin is distinct with the outline attached to the wound base. There is large (67-100%) red, pink granulation within the wound bed. There is no necrotic tissue within the wound bed. Assessment Active Problems ICD-10 Chronic venous hypertension (idiopathic) with ulcer and inflammation of bilateral lower extremity Lymphedema, not elsewhere classified Non-pressure chronic ulcer of left calf with fat layer exposed Non-pressure chronic ulcer of right calf with fat layer exposed Procedures Wound #21 Pre-procedure diagnosis of Wound #21 is a Venous Leg Ulcer located on the Left,Distal Lower Leg . There was a Four Layer Compression Therapy Procedure by Levan Hurst, RN. Post procedure Diagnosis Wound #21: Same as  Pre-Procedure Wound #22 Pre-procedure diagnosis of Wound #22 is a Venous Leg Ulcer located on the Right,Medial Lower Leg . There was a Four Layer Compression Therapy Procedure by Levan Hurst, RN. Post procedure Diagnosis Wound #22: Same as Pre-Procedure Wound #23 Pre-procedure diagnosis of Wound #23 is a Skin T located on the Left,Medial Lower Leg . There was a Four Layer Compression Therapy Procedure by ear Levan Hurst, RN. Post procedure Diagnosis Wound #23: Same as Pre-Procedure Plan Follow-up Appointments: Return Appointment in: - 6 weeks Bathing/ Shower/ Hygiene: May shower with protection but do not get wound dressing(s) wet. Edema Control - Lymphedema / SCD / Other: Elevate legs to the level of the heart or above for 30 minutes  daily and/or when sitting, a frequency of: - throughout the day Avoid standing for long periods of time. WOUND #21: - Lower Leg Wound Laterality: Left, Distal Peri-Wound Care: Zinc Oxide Ointment 30g tube 3 x Per Week/30 Days Discharge Instructions: Apply Zinc Oxide to periwound with each dressing change Peri-Wound Care: Sween Lotion (Moisturizing lotion) 3 x Per Week/30 Days Discharge Instructions: Apply moisturizing lotion as directed Prim Dressing: KerraCel Ag Gelling Fiber Dressing, 4x5 in (silver alginate) 3 x Per Week/30 Days ary Discharge Instructions: Apply silver alginate to wound bed as instructed Secondary Dressing: Woven Gauze Sponge, Non-Sterile 4x4 in 3 x Per Week/30 Days Discharge Instructions: Apply over primary dressing as directed. Secondary Dressing: ABD Pad, 8x10 3 x Per Week/30 Days Discharge Instructions: Apply over primary dressing as directed. Com pression Wrap: FourPress (4 layer compression wrap) 3 x Per Week/30 Days Discharge Instructions: Apply four layer compression as directed. WOUND #22: - Lower Leg Wound Laterality: Right, Medial Peri-Wound Care: Zinc Oxide Ointment 30g tube 3 x Per Week/30 Days Discharge  Instructions: Apply Zinc Oxide to periwound with each dressing change Peri-Wound Care: Sween Lotion (Moisturizing lotion) 3 x Per Week/30 Days Discharge Instructions: Apply moisturizing lotion as directed Prim Dressing: KerraCel Ag Gelling Fiber Dressing, 4x5 in (silver alginate) 3 x Per Week/30 Days ary Discharge Instructions: Apply silver alginate to wound bed as instructed Secondary Dressing: Woven Gauze Sponge, Non-Sterile 4x4 in 3 x Per Week/30 Days Discharge Instructions: Apply over primary dressing as directed. Secondary Dressing: ABD Pad, 8x10 3 x Per Week/30 Days Discharge Instructions: Apply over primary dressing as directed. Com pression Wrap: FourPress (4 layer compression wrap) 3 x Per Week/30 Days Discharge Instructions: Apply four layer compression as directed. WOUND #23: - Lower Leg Wound Laterality: Left, Medial Peri-Wound Care: Zinc Oxide Ointment 30g tube 3 x Per Week/30 Days Discharge Instructions: Apply Zinc Oxide to periwound with each dressing change Peri-Wound Care: Sween Lotion (Moisturizing lotion) 3 x Per Week/30 Days Discharge Instructions: Apply moisturizing lotion as directed Prim Dressing: KerraCel Ag Gelling Fiber Dressing, 4x5 in (silver alginate) 3 x Per Week/30 Days ary Discharge Instructions: Apply silver alginate to wound bed as instructed Secondary Dressing: Woven Gauze Sponge, Non-Sterile 4x4 in 3 x Per Week/30 Days Discharge Instructions: Apply over primary dressing as directed. Secondary Dressing: ABD Pad, 8x10 3 x Per Week/30 Days Discharge Instructions: Apply over primary dressing as directed. Com pression Wrap: FourPress (4 layer compression wrap) 3 x Per Week/30 Days Discharge Instructions: Apply four layer compression as directed. 1. Continue with the silver alginate 2. I had some thoughts about doing a deep tissue culture here although we certainly could not send this to [place] for compounded. 3. Nothing looks like it needed to be debrided  here. #4 if no change in next visit I will consider a deep tissue culture and deal with this locally Electronic Signature(s) Signed: 09/11/2020 7:23:52 PM By: Linton Ham MD Entered By: Linton Ham on 09/11/2020 13:46:47 -------------------------------------------------------------------------------- SuperBill Details [name], [name] 09/11/2020 Medical Record Number: [id number] Patient Account Number: [id number] Date of Birth/Sex: Treating RN: 21-Jan-1956 (64 y.o. [name] Primary Care Provider: [name] Other Clinician: Referring Provider: Treating Provider/Extender: [name] in Treatment: 28 Diagnosis Coding ICD-10 Codes Code Description [phone] Chronic venous hypertension (idiopathic) with ulcer and inflammation of bilateral lower extremity I89.0 Lymphedema, not elsewhere classified L97.222 Non-pressure chronic ulcer of left calf with fat layer exposed L97.212 Non-pressure chronic ulcer of right calf with  fat layer exposed Facility Procedures CPT4: Code [id number] Description: 81 BILATERAL: Application of multi-layer venous compression system; leg (below knee), including ankle and t. Modifier: Quantity: 1 Physician Procedures : CPT4 Code Description Modifier [id number] - WC PHYS LEVEL 3 - EST PT ICD-10 Diagnosis Description I87.333 Chronic venous hypertension (idiopathic) with ulcer and inflammation of bilateral lower extremity L97.222 Non-pressure chronic ulcer of left  calf with fat layer exposed L97.212 Non-pressure chronic ulcer of right calf with fat layer exposed I89.0 Lymphedema, not elsewhere classified Quantity: 1 Electronic Signature(s) Signed: 09/11/2020 7:23:52 PM By: Linton Ham MD Entered By: Linton Ham on 09/11/2020 13:47:15

## 2020-09-13 NOTE — Progress Notes (Signed)
[name], [name] ([id number]) Visit Report for 09/11/2020 Arrival Information Details [name], [name] 09/11/2020 12:30 PM Medical Record Number: [id number] Patient Account Number: [id number] Date of Birth/Sex: Treating RN: 10-27-1955 (64 y.o. [name], [name] Primary Care [name]: [name] Other Clinician: Referring [name]: Treating [name]/Extender: [name] in Treatment: 28 Visit Information History Since Last Visit Added or deleted any medications: No Patient Arrived: Wheel Chair Any new allergies or adverse reactions: No Arrival Time: 13:02 Had a fall or experienced change in No Accompanied By: self activities of daily living that may affect Transfer Assistance: None risk of falls: Patient Identification Verified: Yes Signs or symptoms of abuse/neglect since last visito No Secondary Verification Process Completed: Yes Hospitalized since last visit: No Patient Requires Transmission-Based Precautions: No Implantable device outside of the clinic excluding No Patient Has Alerts: No cellular tissue based products placed in the center since last visit: Has Dressing in Place as Prescribed: Yes Has Compression in Place as Prescribed: Yes Pain Present Now: Yes Electronic Signature(s) Signed: 09/13/2020 5:16:00 PM By: [name] Hammock RN Entered By: [name] on 09/11/2020 13:03:11 -------------------------------------------------------------------------------- Compression Therapy Details [name], [name] 09/11/2020 12:30 PM Medical Record Number: [id number] Patient Account Number: [id number] Date of Birth/Sex: Treating RN: [birth year] (64 y.o. [name] Primary Care [name]: [name] Other Clinician: Referring Kamayah Pillay: Treating [name]/Extender: [name] in Treatment: 28 Compression Therapy Performed for Wound Assessment:  Wound #21 Left,Distal Lower Leg Performed By: Clinician [name] Hurst, RN Compression Type: Four Layer Post Procedure Diagnosis Same as Pre-procedure Electronic Signature(s) Signed: 09/11/2020 5:38:15 PM By: [name] Hurst RN, BSN Entered By: [name] on 09/11/2020 13:39:16 -------------------------------------------------------------------------------- Compression Therapy Details [name], [name] 09/11/2020 12:30 PM Medical Record Number: [id number] Patient Account Number: [id number] Date of Birth/Sex: Treating RN: 10-31-1955 (64 y.o. [name] Primary Care [name]: [name] Other Clinician: Referring Ramona Ruark: Treating [name]/Extender: [name] in Treatment: 28 Compression Therapy Performed for Wound Assessment: Wound #22 Right,Medial Lower Leg Performed By: Clinician [name] Hurst, RN Compression Type: Four Layer Post Procedure Diagnosis Same as Pre-procedure Electronic Signature(s) Signed: 09/11/2020 5:38:15 PM By: [name] Hurst RN, BSN Entered By: [name] on 09/11/2020 13:39:17 -------------------------------------------------------------------------------- Compression Therapy Details [name], [name] 09/11/2020 12:30 PM Medical Record Number: [id number] Patient Account Number: [id number] Date of Birth/Sex: Treating RN: 27-Mar-1956 (64 y.o. [name] Primary Care [name]: [name] Other Clinician: Referring [name]: Treating [name]/Extender: [name] in Treatment: 28 Compression Therapy Performed for Wound Assessment: Wound #23 Left,Medial Lower Leg Performed By: Clinician [name] Hurst, RN Compression Type: Four Layer Post Procedure Diagnosis Same as Pre-procedure Electronic Signature(s) Signed: 09/11/2020 5:38:15 PM By: [name] Hurst RN, BSN Entered By: [name] on 09/11/2020  13:39:17 -------------------------------------------------------------------------------- Encounter Discharge Information Details [name], [name] 09/11/2020 12:30 PM Medical Record Number: [id number] Patient Account Number: [id number] Date of Birth/Sex: Treating RN: [birth year] (64 y.o. [name], [name] Primary Care [name]: [name] Other Clinician: Referring [name]: Treating [name]/Extender: [name] in Treatment: 28 Encounter Discharge Information Items Discharge Condition: Stable Ambulatory Status: Wheelchair Discharge Destination: Home Transportation: Private Auto Accompanied By: self Schedule Follow-up Appointment: Yes Clinical Summary of Care: Patient Declined Electronic Signature(s) Signed: 09/13/2020 5:16:00 PM By: [name] Hammock RN Entered By: [name] on 09/11/2020 14:06:44 -------------------------------------------------------------------------------- Lower Extremity Assessment  Details [name], [name] 09/11/2020 12:30 PM Medical Record Number: [id number] Patient Account Number: [id number] Date of Birth/Sex: Treating RN: [birth year] (64 y.o. [name], [name] Primary Care [name]: [name] Other Clinician: Referring [name]: Treating [name]/Extender: [name] in Treatment: 28 Edema Assessment Assessed: [name]: Yes] [name]: Yes] E[Left: dema] [Right: :] Calf Left: Right: Point of Measurement: 52 cm From Medial Instep 43 cm 38 cm Ankle Left: Right: Point of Measurement: 10 cm From Medial Instep 25 cm 25 cm Vascular Assessment Pulses: Dorsalis Pedis Palpable: [Left:Yes] [Right:Yes] Posterior Tibial Palpable: [Left:Yes] [Right:Yes] Electronic Signature(s) Signed: 09/13/2020 5:16:00 PM By: [name] Hammock RN Entered By: [name] on 09/11/2020  13:13:13 -------------------------------------------------------------------------------- Multi Wound Chart Details [name], [name] 09/11/2020 12:30 PM Medical Record Number: [id number] Patient Account Number: [id number] Date of Birth/Sex: Treating RN: [birth year] (64 y.o. [name] Primary Care [name]: [name] Other Clinician: Referring Zai Chmiel: Treating Zarie Kosiba/Extender: [name] in Treatment: 28 Vital Signs Height(in): 49 Capillary Blood Glucose(mg/dl): 168 Weight(lbs): 254 Pulse(bpm): 74 Body Mass Index(BMI): 30 Blood Pressure(mmHg): 150/85 Temperature(F): 98 Respiratory Rate(breaths/min): 17 Photos: [21:No Photos Left, Distal Lower Leg] [22:No Photos Right, Medial Lower Leg] [23:No Photos Left, Medial Lower Leg] Wound Location: [21:Gradually Appeared] [22:Gradually Appeared] [23:Trauma] Wounding Event: [21:Venous Leg Ulcer] [22:Venous Leg Ulcer] [23:Skin T ear] Primary Etiology: [21:Anemia, Sleep Apnea, Congestive] [22:Anemia, Sleep Apnea, Congestive] [23:Anemia, Sleep Apnea, Congestive] Comorbid History: [21:Heart Failure, Hypertension, Peripheral Heart Failure, Hypertension, Peripheral Heart Failure, Hypertension, Peripheral Venous Disease, Type II Diabetes, Osteoarthritis, Neuropathy 11/22/2019] [22:Venous Disease, Type II Diabetes,  Osteoarthritis, Neuropathy 12/23/2019] [23:Venous Disease, Type II Diabetes, Osteoarthritis, Neuropathy 09/08/2020] Date Acquired: [21:28] [22:28] [23:0] [name] of Treatment: [21:Open] [22:Open] [23:Open] Wound Status: [21:16.5x19x0.9] [22:4.5x3.3x0.2] [23:1x2.5x0.1] Measurements L x W x D (cm) [21:246.222] [22:11.663] [23:1.963] A (cm) : rea [21:221.6] [22:2.333] [76:8.115] Volume (cm) : [21:-56.70%] [22:48.40%] [23:N/A] % Reduction in Area: [21:-135.10%] [22:48.40%] [23:N/A] % Reduction in Volume: [21:Full Thickness Without Exposed] [22:Full Thickness Without  Exposed] [23:Partial Thickness] Classification: [21:Support Structures Large] [22:Support Structures Medium] [23:Medium] Exudate Amount: [21:Serosanguineous] [22:Serosanguineous] [23:Sanguinous] Exudate Type: [21:red, brown] [22:red, brown] [23:red] Exudate Color: [21:Distinct, outline attached] [22:Flat and Intact] [23:Distinct, outline attached] Wound Margin: [21:Medium (34-66%)] [22:Medium (34-66%)] [23:Large (67-100%)] Granulation Amount: [21:Pink] [22:Pink] [23:Red, Pink] Granulation Quality: [21:Medium (34-66%)] [22:Medium (34-66%)] [23:None Present (0%)] Necrotic Amount: [21:Fat Layer (Subcutaneous Tissue): Yes Fat Layer (Subcutaneous Tissue): Yes Fascia: No] Exposed Structures: [21:Fascia: No Tendon: No Muscle: No Joint: No Bone: No Small (1-33%)] [22:Fascia: No Tendon: No Muscle: No Joint: No Bone: No Small (1-33%)] [23:Fat Layer (Subcutaneous Tissue): No Tendon: No Muscle: No Joint: No Bone: No Small (1-33%)] Epithelialization: [21:Compression Therapy] [22:Compression Therapy] [23:Compression Therapy] Treatment Notes Electronic Signature(s) Signed: 09/11/2020 5:38:15 PM By: [name] Hurst RN, BSN Signed: 09/11/2020 7:23:52 PM By: Linton Ham MD Entered By: Linton Ham on 09/11/2020 13:42:40 -------------------------------------------------------------------------------- Multi-Disciplinary Care Plan Details [name], [name] 09/11/2020 12:30 PM Medical Record Number: [id number] Patient Account Number: [id number] Date of Birth/Sex: Treating RN: 1955/11/14 (64 y.o. [name] Primary Care Liala Codispoti: [name] Other Clinician: Referring Dorsie Sethi: Treating Calyx Hawker/Extender: [name] in Treatment: 28 Active Inactive Wound/Skin Impairment Nursing Diagnoses: Impaired tissue integrity Goals: Patient/caregiver will verbalize understanding of skin care regimen Date Initiated: 04/27/2020 Target  Resolution Date: 10/13/2020 Goal Status: Active Ulcer/skin breakdown will have a volume reduction of 50% by week 8 Date Initiated: 02/25/2020 Date Inactivated: 04/27/2020 Target  Resolution Date: 04/14/2020 Goal Status: Met Interventions: Provide education on ulcer and skin care Notes: Electronic Signature(s) Signed: 09/11/2020 5:38:15 PM By: [name] Hurst RN, BSN Entered By: [name] on 09/11/2020 13:27:12 -------------------------------------------------------------------------------- Pain Assessment Details [name], [name] 09/11/2020 12:30 PM Medical Record Number: [id number] Patient Account Number: [id number] Date of Birth/Sex: Treating RN: 1956/02/24 (64 y.o. [name] Primary Care Imojean Yoshino: [name] Other Clinician: Referring Esha Fincher: Treating Alyce Inscore/Extender: [name] in Treatment: 28 Active Problems Location of Pain Severity and Description of Pain Patient Has Paino Yes Site Locations Pain Location: Pain in Ulcers With Dressing Change: Yes Duration of the Pain. Constant / Intermittento Intermittent Rate the pain. Current Pain Level: 7 Worst Pain Level: 10 Least Pain Level: 0 Tolerable Pain Level: 7 Character of Pain Describe the Pain: Aching Pain Management and Medication Current Pain Management: Medication: Yes Cold Application: No Rest: No Massage: No Activity: No T.E.N.S.: No Heat Application: No Leg drop or elevation: No Is the Current Pain Management Adequate: Adequate How does your wound impact your activities of daily livingo Sleep: No Bathing: No Appetite: No Relationship With Others: No Bladder Continence: No Emotions: No Bowel Continence: No Work: No Toileting: No Drive: No Dressing: No Hobbies: No Electronic Signature(s) Signed: 09/13/2020 5:16:00 PM By: [name] Hammock RN Entered By: [name] on 09/11/2020  13:07:29 -------------------------------------------------------------------------------- Patient/Caregiver Education Details Patient Name: Date of Service: [name] 12/20/2021andnbsp12:30 PM Medical Record Number: [id number] Patient Account Number: [id number] Date of Birth/Gender: Treating RN: 03/25/1956 (64 y.o. [name] Primary Care Physician: [name] Other Clinician: Referring Physician: Treating Physician/Extender: [name] in Treatment: 28 Education Assessment Education Provided To: Patient Education Topics Provided Wound/Skin Impairment: Methods: Explain/Verbal Responses: State content correctly [employer]) Signed: 09/11/2020 5:38:15 PM By: [name] Hurst RN, BSN Entered By: [name] on 09/11/2020 13:27:25 -------------------------------------------------------------------------------- Wound Assessment Details [name], [name] 09/11/2020 12:30 PM Medical Record Number: [id number] Patient Account Number: [id number] Date of Birth/Sex: Treating RN: [birth year] (64 y.o. [name], [name] Primary Care Erastus Bartolomei: [name] Other Clinician: Referring Searcy Miyoshi: Treating Carrington Olazabal/Extender: [name] in Treatment: 28 Wound Status Wound Number: 21 Primary Venous Leg Ulcer Etiology: Wound Location: Left, Distal Lower Leg Wound Open Wounding Event: Gradually Appeared Status: Date Acquired: 11/22/2019 Comorbid Anemia, Sleep Apnea, Congestive Heart Failure, Hypertension, [name] Of Treatment: 28 History: Peripheral Venous Disease, Type II Diabetes, Osteoarthritis, Clustered Wound: No Neuropathy Photos Photo Uploaded By: Mikeal Hawthorne on 09/12/2020 14:34:40 Wound Measurements Length: (cm) 16.5 Width: (cm) 19 Depth: (cm) 0.9 Area: (cm) 246.222 Volume: (cm) 221.6 % Reduction in Area: -56.7% % Reduction in Volume:  -135.1% Epithelialization: Small (1-33%) Tunneling: No Undermining: No Wound Description Classification: Full Thickness Without Exposed Support Structures Wound Margin: Distinct, outline attached Exudate Amount: Large Exudate Type: Serosanguineous Exudate Color: red, brown Foul Odor After Cleansing: No Slough/Fibrino Yes Wound Bed Granulation Amount: Medium (34-66%) Exposed Structure Granulation Quality: Pink Fascia Exposed: No Necrotic Amount: Medium (34-66%) Fat Layer (Subcutaneous Tissue) Exposed: Yes Necrotic Quality: Adherent Slough Tendon Exposed: No Muscle Exposed: No Joint Exposed: No Bone Exposed: No Treatment Notes Wound #21 (Lower Leg) Wound Laterality: Left, Distal Cleanser Peri-Wound Care Zinc Oxide Ointment 30g tube Discharge Instruction: Apply Zinc Oxide to periwound with each dressing change Sween Lotion (Moisturizing lotion) Discharge Instruction: Apply moisturizing lotion as directed Topical Primary Dressing KerraCel Ag Gelling Fiber Dressing, 4x5 in (silver alginate) Discharge Instruction: Apply silver  alginate to wound bed as instructed Secondary Dressing Woven Gauze Sponge, Non-Sterile 4x4 in Discharge Instruction: Apply over primary dressing as directed. ABD Pad, 8x10 Discharge Instruction: Apply over primary dressing as directed. Secured With Compression Wrap FourPress (4 layer compression wrap) Discharge Instruction: Apply four layer compression as directed. Compression Stockings Add-Ons Electronic Signature(s) Signed: 09/13/2020 5:16:00 PM By: [name] Hammock RN Entered By: [name] on 09/11/2020 13:12:16 -------------------------------------------------------------------------------- Wound Assessment Details [name], [name] 09/11/2020 12:30 PM Medical Record Number: [id number] Patient Account Number: [id number] Date of Birth/Sex: Treating RN: [birth year] (64 y.o. [name], [name] Primary Care  Clive Parcel: [name] Other Clinician: Referring Norina Cowper: Treating Travius Crochet/Extender: [name] in Treatment: 28 Wound Status Wound Number: 22 Primary Venous Leg Ulcer Etiology: Wound Location: Right, Medial Lower Leg Wound Open Wounding Event: Gradually Appeared Status: Date Acquired: 12/23/2019 Comorbid Anemia, Sleep Apnea, Congestive Heart Failure, Hypertension, [name] Of Treatment: 28 History: Peripheral Venous Disease, Type II Diabetes, Osteoarthritis, Clustered Wound: No Neuropathy Photos Photo Uploaded By: Mikeal Hawthorne on 09/12/2020 14:34:41 Wound Measurements Length: (cm) 4.5 Width: (cm) 3.3 Depth: (cm) 0.2 Area: (cm) 11.663 Volume: (cm) 2.333 % Reduction in Area: 48.4% % Reduction in Volume: 48.4% Epithelialization: Small (1-33%) Tunneling: No Undermining: No Wound Description Classification: Full Thickness Without Exposed Support Structures Wound Margin: Flat and Intact Exudate Amount: Medium Exudate Type: Serosanguineous Exudate Color: red, brown Foul Odor After Cleansing: No Slough/Fibrino Yes Wound Bed Granulation Amount: Medium (34-66%) Exposed Structure Granulation Quality: Pink Fascia Exposed: No Necrotic Amount: Medium (34-66%) Fat Layer (Subcutaneous Tissue) Exposed: Yes Necrotic Quality: Adherent Slough Tendon Exposed: No Muscle Exposed: No Joint Exposed: No Bone Exposed: No Treatment Notes Wound #22 (Lower Leg) Wound Laterality: Right, Medial Cleanser Peri-Wound Care Zinc Oxide Ointment 30g tube Discharge Instruction: Apply Zinc Oxide to periwound with each dressing change Sween Lotion (Moisturizing lotion) Discharge Instruction: Apply moisturizing lotion as directed Topical Primary Dressing KerraCel Ag Gelling Fiber Dressing, 4x5 in (silver alginate) Discharge Instruction: Apply silver alginate to wound bed as instructed Secondary Dressing Woven Gauze Sponge, Non-Sterile 4x4 in Discharge  Instruction: Apply over primary dressing as directed. ABD Pad, 8x10 Discharge Instruction: Apply over primary dressing as directed. Secured With Compression Wrap FourPress (4 layer compression wrap) Discharge Instruction: Apply four layer compression as directed. Compression Stockings Add-Ons Electronic Signature(s) Signed: 09/13/2020 5:16:00 PM By: [name] Hammock RN Entered By: [name] on 09/11/2020 13:12:36 -------------------------------------------------------------------------------- Wound Assessment Details [name], [name] 09/11/2020 12:30 PM Medical Record Number: [id number] Patient Account Number: [id number] Date of Birth/Sex: Treating RN: 18-Aug-1956 (64 y.o. [name], [name] Primary Care Nirali Magouirk: [name] Other Clinician: Referring Dewey Neukam: Treating Pearl Bents/Extender: [name] in Treatment: 28 Wound Status Wound Number: 23 Primary Skin T ear Etiology: Wound Location: Left, Medial Lower Leg Wound Open Wounding Event: Trauma Status: Date Acquired: 09/08/2020 Comorbid Anemia, Sleep Apnea, Congestive Heart Failure, Hypertension, [name] Of Treatment: 0 History: Peripheral Venous Disease, Type II Diabetes, Osteoarthritis, Clustered Wound: No Neuropathy Photos Photo Uploaded By: Mikeal Hawthorne on 09/12/2020 14:34:14 Wound Measurements Length: (cm) 1 Width: (cm) 2.5 Depth: (cm) 0.1 Area: (cm) 1.963 Volume: (cm) 0.196 % Reduction in Area: % Reduction in Volume: Epithelialization: Small (1-33%) Tunneling: No Undermining: No Wound Description Classification: Partial Thickness Wound Margin: Distinct, outline attached Exudate Amount: Medium Exudate Type: Sanguinous Exudate Color: red Foul Odor After Cleansing: No Slough/Fibrino No Wound Bed Granulation Amount: Large (67-100%) Exposed Structure Granulation Quality: Red, Pink Fascia Exposed:  No Necrotic Amount: None Present  (0%) Fat Layer (Subcutaneous Tissue) Exposed: No Tendon Exposed: No Muscle Exposed: No Joint Exposed: No Bone Exposed: No Treatment Notes Wound #23 (Lower Leg) Wound Laterality: Left, Medial Cleanser Peri-Wound Care Zinc Oxide Ointment 30g tube Discharge Instruction: Apply Zinc Oxide to periwound with each dressing change Sween Lotion (Moisturizing lotion) Discharge Instruction: Apply moisturizing lotion as directed Topical Primary Dressing KerraCel Ag Gelling Fiber Dressing, 4x5 in (silver alginate) Discharge Instruction: Apply silver alginate to wound bed as instructed Secondary Dressing Woven Gauze Sponge, Non-Sterile 4x4 in Discharge Instruction: Apply over primary dressing as directed. ABD Pad, 8x10 Discharge Instruction: Apply over primary dressing as directed. Secured With Compression Wrap FourPress (4 layer compression wrap) Discharge Instruction: Apply four layer compression as directed. Compression Stockings Add-Ons Electronic Signature(s) Signed: 09/13/2020 5:16:00 PM By: [name] Hammock RN Entered By: [name] on 09/11/2020 13:10:22 -------------------------------------------------------------------------------- Vitals Details [name], [name] 09/11/2020 12:30 PM Medical Record Number: [id number] Patient Account Number: [id number] Date of Birth/Sex: Treating RN: 07/10/1956 (64 y.o. [name], [name] Primary Care Maleyah Evans: [name] Other Clinician: Referring Rosealee Recinos: Treating Tracey Stewart/Extender: [name] in Treatment: 28 Vital Signs Time Taken: 13:05 Temperature (F): 98 Height (in): 77 Pulse (bpm): 80 Weight (lbs): 254 Respiratory Rate (breaths/min): 17 Body Mass Index (BMI): 30.1 Blood Pressure (mmHg): 150/85 Capillary Blood Glucose (mg/dl): 168 Reference Range: 80 - 120 mg / dl Electronic Signature(s) Signed: 09/13/2020 5:16:00 PM By: [name] Hammock RN Entered  By: [name] on 09/11/2020 13:05:54

## 2020-09-21 ENCOUNTER — Ambulatory Visit (INDEPENDENT_AMBULATORY_CARE_PROVIDER_SITE_OTHER): Payer: Medicare Other | Admitting: Surgery

## 2020-09-21 ENCOUNTER — Ambulatory Visit (INDEPENDENT_AMBULATORY_CARE_PROVIDER_SITE_OTHER): Payer: Medicare Other

## 2020-09-21 ENCOUNTER — Encounter: Payer: Self-pay | Admitting: Surgery

## 2020-09-21 ENCOUNTER — Ambulatory Visit
Admission: RE | Admit: 2020-09-21 | Discharge: 2020-09-21 | Disposition: A | Discharge status: Home / Self Care | Payer: Medicare Other | Source: Ambulatory Visit | Attending: Surgery | Admitting: Surgery

## 2020-09-21 DIAGNOSIS — M1612 Unilateral primary osteoarthritis, left hip: Secondary | ICD-10-CM | POA: Diagnosis not present

## 2020-09-21 DIAGNOSIS — S72002A Fracture of unspecified part of neck of left femur, initial encounter for closed fracture: Secondary | ICD-10-CM | POA: Diagnosis not present

## 2020-09-21 DIAGNOSIS — M25552 Pain in left hip: Secondary | ICD-10-CM | POA: Diagnosis not present

## 2020-09-21 NOTE — Progress Notes (Signed)
Office Visit Note   Patient: [name]           Date of Birth: 05-Jun-1956           MRN: [id number] Visit Date: 09/21/2020              Requested by: Sherwood Gambler, MD [phone] [facility] [address] [place],  [place] [address] PCP: Sherwood Gambler, MD   Assessment & Plan: Visit Diagnoses:  1. Pain in left hip   2. Arthritis of left hip     Plan: With patient's severe acute on chronic left hip pain and question of a fracture seen on x-ray I will order stat CT scan. I will await callback report. We discussed treatments for severe left hip DJD with and without fracture. He voices understanding. If no fracture seen on CT scan I will have him follow-up with Dr. Cleophas Dunker next week. All questions answered.  Follow-Up Instructions: Return in about 1 week (around 09/28/2020) for With Dr. Cleophas Dunker for recheck left hip.   Orders:  Orders Placed This Encounter  Procedures   XR HIP UNILAT W OR W/O PELVIS 1V LEFT   CT HIP LEFT WO CONTRAST   No orders of the defined types were placed in this encounter.     Procedures: No procedures performed   Clinical Data: No additional findings.   Subjective: Chief Complaint  Patient presents with   Left Hip - Pain    HPI 64 year old black male comes in today with complaints of severe left hip pain. Patient states that he has a known history of left hip DJD. He did have a CT abdomen and pelvis 01/31/2020 for palpable abnormality in the bilateral groin and that scan did show severe degenerative changes of the left hip similar to prior study. Patient states that he has had a chronic left groin pain for several years. He is currently residing at [facility] and rehab. States that he has been able to get up and ambulate with the use of a walker with no assistance. He also gets around in a wheelchair. States that he uses a wheelchair for his chronic left hip pain along with other medical issues. States this past Saturday he was in his  bed when he was rolling to his left to go to the bathroom when he [place] a "loud pop" in his left hip. Has severe pain with this. He has severe pain with attempted weightbearing on the left leg. Pain does not improve. This is significantly different from his usual left hip issue. Review of Systems No current cardiopulmonary GI duration  Objective: Vital Signs: There were no vitals taken for this visit.  Physical Exam HENT:     Head: Normocephalic.  Pulmonary:     Effort: No respiratory distress.  Musculoskeletal:     Comments: Patient has marked pain in the left hip with about 5 degrees internal and external rotation. Patient states pain was worse with external rotation. Bilateral lower extremity leg wrappings.  Neurological:     Mental Status: He is alert and oriented to person, place, and time.     Ortho Exam  Specialty Comments:  No specialty comments available.  Imaging: XR HIP UNILAT W OR W/O PELVIS 1V LEFT  Result Date: 09/21/2020 Severe DJD left hip with bone-on-bone changes, periarticular spurs, subchondral cystic changes. There is question of a subcapital lucency seen on x-ray. Difficult to tell whether or not this is a true fracture.    PMFS History: Patient  Active Problem List   Diagnosis Date Noted   Benign prostatic hyperplasia with urinary obstruction 08/29/2020   Overactive bladder 08/29/2020   Type II diabetes mellitus, uncontrolled ([place]) 08/29/2020   Impotence 08/29/2020   Urge incontinence of urine 08/29/2020   Chronic respiratory failure with hypoxia ([place]) 04/06/2019   Dyslipidemia 04/06/2019   Hypomagnesemia 04/06/2019   MDD (major depressive disorder) 04/06/2019   Psychosis ([place]) 04/05/2019   Dyspnea 02/12/2019   Pain in left wrist [id number]   Diastolic dysfunction [id number]   Primary skin malignancy with unknown cell type [id number]   Acute diastolic heart failure ([place]) 08/25/2016   Hypoxia 08/21/2016   Acute respiratory  failure with hypoxia and hypercapnia ([facility]) 08/21/2016   Moderate protein-calorie malnutrition ([place]) 08/21/2016   Stasis edema of both lower extremities 08/21/2016   Chronic pain 08/21/2016   Tobacco use disorder 02/23/2016   Venous stasis syndrome 02/09/2016   Spondylosis without myelopathy or radiculopathy, lumbar region 12/13/2015   AKI (acute kidney injury) ([place]) 11/02/2015   Hyperkalemia 11/02/2015   Hypoglycemia 11/02/2015   Cellulitis    Leg ulcer ([place])    Nonhealing ulcer of left lower extremity ([place])    Pain of left leg    Cellulitis of leg 04/25/2015   Degenerative arthritis of hip 04/25/2015   Cellulitis and abscess of leg    Microcytic anemia 03/27/2015   Hypertension    Fever    DM (diabetes mellitus), secondary, uncontrolled, with peripheral vascular complications ([place]) 0000000   MSSA (methicillin susceptible Staphylococcus aureus) infection 03/20/2015   Leukocytosis 03/20/2015   Anemia, iron deficiency 03/20/2015   Cellulitis of left lower extremity    Pyomyositis    Systemic infection ([place]) 03/14/2015   Past Medical History:  Diagnosis Date   Anemia    Arthritis    "left hip" (04/26/2015)   Cellulitis and abscess of leg hositalized 04/25/2015   left   Chronic hip pain    Depression    GERD (gastroesophageal reflux disease)    Hypercholesterolemia    Hypertension    Pneumonia 1960's X 1   Primary skin malignancy with unknown cell type 12/10/2016   Sleep apnea    "couldn't wear the mask" (04/26/2015)   Thalassemia minor    Type II diabetes mellitus ([place])     Family History  Problem Relation Age of Onset   Diabetes Mellitus II Mother    Diabetes Mellitus II Father    Diabetes Mellitus II Brother     Past Surgical History:  Procedure Laterality Date   INGUINAL HERNIA REPAIR Right ~ 2004   SHOULDER ARTHROSCOPY W/ ROTATOR CUFF REPAIR Right ~ 2004   TOENAIL EXCISION Right 1980's X 2   "big toe"   Social  History   Occupational History   Not on file  Tobacco Use   Smoking status: Current Every Day Smoker    Packs/day: 0.12    Years: 45.00    Pack years: 5.40    Types: Cigarettes   Smokeless tobacco: Never Used  Vaping Use   Vaping Use: Never used  Substance and Sexual Activity   Alcohol use: No    Comment: 04/26/2015 "I may drink 1/2 glass of wine or a couple beers 1-2 times/month; if that"   Drug use: No    Comment: "stopped all drug use in 2005; S/P SARP program in [place]"   Sexual activity: Never

## 2020-10-05 ENCOUNTER — Other Ambulatory Visit (HOSPITAL_COMMUNITY): Payer: Self-pay | Admitting: Internal Medicine

## 2020-10-05 ENCOUNTER — Other Ambulatory Visit: Payer: Self-pay | Admitting: Internal Medicine

## 2020-10-05 DIAGNOSIS — M25552 Pain in left hip: Secondary | ICD-10-CM

## 2020-10-17 ENCOUNTER — Ambulatory Visit (HOSPITAL_COMMUNITY)
Admission: RE | Admit: 2020-10-17 | Discharge: 2020-10-17 | Disposition: A | Discharge status: Home / Self Care | Payer: Medicare Other | Source: Ambulatory Visit | Attending: Internal Medicine | Admitting: Internal Medicine

## 2020-10-17 ENCOUNTER — Encounter (HOSPITAL_COMMUNITY): Payer: Self-pay

## 2020-10-17 ENCOUNTER — Other Ambulatory Visit: Payer: Self-pay

## 2020-10-17 DIAGNOSIS — M25552 Pain in left hip: Secondary | ICD-10-CM | POA: Insufficient documentation

## 2020-10-17 LAB — POCT I-STAT CREATININE: Creatinine, Ser: 0.8 mg/dL (ref 0.61–1.24)

## 2020-10-17 MED ORDER — IOHEXOL 300 MG/ML  SOLN
100.0000 mL | Freq: Once | INTRAMUSCULAR | Status: AC | PRN
  Administered 2020-10-17: 100 mL via INTRAVENOUS

## 2020-10-23 ENCOUNTER — Encounter (HOSPITAL_BASED_OUTPATIENT_CLINIC_OR_DEPARTMENT_OTHER): Payer: Medicare Other | Attending: Internal Medicine | Admitting: Internal Medicine

## 2020-11-02 ENCOUNTER — Encounter (HOSPITAL_BASED_OUTPATIENT_CLINIC_OR_DEPARTMENT_OTHER): Payer: Medicare Other | Attending: Internal Medicine | Admitting: Internal Medicine

## 2020-11-02 ENCOUNTER — Other Ambulatory Visit: Payer: Self-pay

## 2020-11-02 DIAGNOSIS — I872 Venous insufficiency (chronic) (peripheral): Secondary | ICD-10-CM | POA: Insufficient documentation

## 2020-11-02 DIAGNOSIS — E1151 Type 2 diabetes mellitus with diabetic peripheral angiopathy without gangrene: Secondary | ICD-10-CM | POA: Diagnosis not present

## 2020-11-02 DIAGNOSIS — I110 Hypertensive heart disease with heart failure: Secondary | ICD-10-CM | POA: Diagnosis not present

## 2020-11-02 DIAGNOSIS — E11622 Type 2 diabetes mellitus with other skin ulcer: Secondary | ICD-10-CM | POA: Insufficient documentation

## 2020-11-02 DIAGNOSIS — E1140 Type 2 diabetes mellitus with diabetic neuropathy, unspecified: Secondary | ICD-10-CM | POA: Insufficient documentation

## 2020-11-02 DIAGNOSIS — I890 Lymphedema, not elsewhere classified: Secondary | ICD-10-CM | POA: Diagnosis not present

## 2020-11-02 DIAGNOSIS — L97222 Non-pressure chronic ulcer of left calf with fat layer exposed: Secondary | ICD-10-CM | POA: Diagnosis not present

## 2020-11-02 DIAGNOSIS — L97212 Non-pressure chronic ulcer of right calf with fat layer exposed: Secondary | ICD-10-CM | POA: Diagnosis not present

## 2020-11-02 DIAGNOSIS — I509 Heart failure, unspecified: Secondary | ICD-10-CM | POA: Insufficient documentation

## 2020-11-02 DIAGNOSIS — I87333 Chronic venous hypertension (idiopathic) with ulcer and inflammation of bilateral lower extremity: Secondary | ICD-10-CM | POA: Diagnosis present

## 2020-11-02 NOTE — Progress Notes (Signed)
[name], [name] ([id number]) Visit Report for 11/02/2020 HPI Details Patient Name: Date of Service: HEA RDGayla Doss 11/02/2020 1:15 PM Medical Record Number: [id number] Patient Account Number: [id number] Date of Birth/Sex: Treating RN: [birth year] (65 y.o. [name] Primary Care Provider: Leonie Douglas Other Clinician: Referring Provider: Treating Provider/Extender: Mikal Plane in Treatment: 35 History of Present Illness HPI Description: 07/07/15; this is a patient who is in hospital on 8/2 through 8/4. He had cellulitis and abscess of predominantly I think the left leg. He received IV antibiotics. Plain x-ray showed no osteomyelitis. An MRI of the left leg did not show osteomyelitis. Cultures showed no predominant organism. His hemoglobin A1c was 9.8. He has a history of venous stasis also peripheral vascular disease. He was discharged to [facility] nursing home. He really has extensive ulcerations on the left lateral leg including a major wound that communicates both posteriorly and superiorly w. When drainage coming out of 2 smaller areas. He has a smaller wound on the posterior medial left leg. He has more predominantly venous insufficiency wounds on predominantly the right medial leg above the ankle the right foot into sections. He has recently been put on Augmentin at the nursing home. Previous ABIs/arterial evaluation showed triphasic waves diffusely he does not have a major ischemic issue a left lower extremity venous duplex also exam showed no evidence of a DVT on 6/21 07/21/15; the patient arrives with really no major change. Culture I did last time was negative. He has not had a follow-up MRI I ordered. The wounds are macerated covered with a thick gelatinous surface slough. There is necrotic subcutaneous tissue 08/04/15; the patient arrives with a considerable improvement in the majority of his wound area. The area on the left leg now has what looks to  be a granulated base. Most of the wounds on the left leg required an aggressive surgical debridement to remove nonviable fibrinous eschar and subcutaneous tissue however after debridement most of this looks better. Although I had asked for repeat MRI of the left leg when he first came in here with grossly purulent material coming out of his wounds, it does not appear that this is been done and nor do I actually feel that strongly about it right now. He has severe surrounding venous insufficiency and inflammation. I don't believe he has significant PAD 08/25/15. In general there is still a considerable wound area here but with still extensive service adherent slough. The patient will not allow mechanical debridement due to pain. He did not tolerate Medihoney therefore we are left with Santyl for now. She would appear that he has severe surrounding venous stasis. There is no evidence of the infection that may have had something to do with the pathogenesis of these wounds 09/29/15; patient still has substantial wound area on the left leg with a cluster of several wounds on the lateral leg confluently on the left leg posteriorly and then a substantial wound on the medial leg. On the right leg a substantial wound medially and a small area on the right lateral foot. All of these underwent a substantial surgical debridement with curettes which she tolerated better than he has in the past. This started as a complex cellulitis in the face of chronic venous insufficiency and inflammation. 10/13/15; substantial cluster of wounds on the lateral aspect of his left leg confluently around to the other side. Extensive surgical debridement to remove for redness surface slough nonviable subcutaneous tissue. This is not an improvement. Also  substantial wound on the medial right leg which is largely unchanged. The etiology of this was felt to be a complex cellulitis in the summer of 2016 in the face of chronic venous  insufficiency and inflammation. The patient states that the wound on the right medial leg has been there for years off and on. 10/20/15; we are able to start [facility] to these extensive wound areas left greater than right on Tuesday after I spoke to the wound care nurse at the facility. He arrives here today for extensive surgical debridement for the wounds on the lateral aspect of his left leg posterior left leg, this is almost circumferential. He has a large wound on the medial aspect of his right leg although he finds this too painful for debridement 10/27/15; I continue to bring this patient back for frequent debridement/weekly debridement severe. We have been using Hydrofera Blue. Unfortunately this has not really had any improvement. The debridement surgery difficult and painful for the patient 11/23/15; this patient spent a complex hospitalization admitted with acute kidney injury, anemia, cellulitis of the lower extremity, he was felt to have sepsis pathophysiology although his blood cultures were negative. He had plain x-rays of both legs that were negative for osseous abnormalities. He was seen by infectious disease and placed on a workup for vasculitis that was negative including a biopsy 4 all were consistent with stasis dermatitis. Vital broad- spectrum antibiotics he continued to spike fevers. Urine and chest x-ray were negative Dopplers on admission ruled out a DVT All antibiotics were stopped on . 2/17. Since his return to [facility] skilled nursing facility I believe they have been using Xeroform 12/07/15 the patient arrives today with the area on his right medial leg actually looking quite stable. No debridement. The rest of his extensive wounds on the left lateral left posterior extending into the left medial leg all required extensive debridement. I think this is probably going to need to and up in the hands of plastic surgery. We'll attempt to change him back to [facility].  Arrange consultation with plastic surgery at [facility] for this almost circumferential wound on the left side 12/21/15 right leg covered in surface slough. This was debridement. Left leg extensive wounds all carefully examined. This is almost circumferential on the left side especially on the posterior calf. No debridement is necessary. 01/04/16 the patient has been to [facility] plastic surgery. Unfortunately it doesn't look like they had any of the prior workup on this patient. They're in the middle of vascular workup. It sounds as though they're applying Hydrofera Blue at the nursing home. 01/22/16; the patient has been back to see [facility]. There apparently making plans to possibly do skin grafts over his large venous insufficiency ulcerations. The patient tells me that he goes to hematology in [facility] and variably uses the term platelets red cells and the description of his problem. 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 I do not see any major difference in the wound on the right medial leg and circumferentially across the left medial to left lateral leg. Did not attempt to debride these today. Readmission: 05/05/18 on evaluation today patient presents for reevaluation here in our clinic although I have previously seen him in the skilled nursing facility over the past year and a half when I was working in the skilled nursing facility realm instead of covering in the clinic. With that being said during that time we had initiated most  recently dressings with Hydrofera Blue Dressing along with the [employer] which had done very well for him. Much better than the Kerlex Coban wraps. With that being said the patient had been doing fairly well and was making progress when I last saw him. Upon evaluation today it appears that the wounds are actually a little bit worse than when I last saw him although definitely not dramatically so. There does not appear  to be any evidence of infection which is good news. With that being said he has been tolerating the wraps without complication his left hip still gives him a lot of trouble which is his main issue as far as movement is concerned. Unbeknownst to me he has not had venous studies that I can find. He previously told me he had there were arterial studies noted back from 04/27/15 which showed that he had try phasic blood flow throughout and again his test appear to be completely normal as performed by Dr. Creig Hines. With that being said I cannot find where he had venous studies. The patient still states he thinks he did these definitely had no venous intervention at this point. Upon evaluation today it does not appear to me that the patient has any evidence of cellulitis. He does have some chronic venous insufficiency which I think has led to stasis dermatitis but again this is not appearing to be infected at this point. No fevers, chills, nausea, or vomiting noted at this time. READMISSION 06/30/2018 This is a now 65 year old man we have had in this clinic at multiple times in the past. Most recently he came in August and was seen by [facility] stone. It is not clear why he did not come back we asked him really did not get a straight answer. He is at [facility] skilled facility he is a man who has severe chronic venous insufficiency with lymphedema and has had severe wounds on his left greater than right leg. We had him here in 2016 and 17 ultimately referred him to [facility]. He had arterial studies and venous studies done at [facility] although I am not able to access these records I see they are actually done. He also had multiple biopsies that were negative for malignancy showing changes compatible with chronic inflammation. As I understand things in [place] they are using Iodoflex and Unna boots. I am not really sure how they are getting enough Iodoflex for the large wound area especially on his left  calf. Nevertheless the wounds look better than when I saw this in 2017. There were plans for him to have plastic surgery in 2018 and I think he was actually prepped for surgery however he was ultimately denied because the patient was an active smoker. The patient is not systemically unwell. The wounds are painful however given the size especially on the left this is not surprising. ABIs in this clinic were 0.78 on the left and 0.91 on the right 07/13/2018; this is a patient with bilateral severe venous ulcers with secondary lymphedema. The wounds are on the right medial calf and a substantial part of the left posterior calf and some involvement medially and laterally on the left. We changed him to silver alginate under Unna boot's last time. The wounds actually look fairly satisfactory today. 08/14/2018; this is a patient with severe bilateral venous stasis ulcers with secondary lymphedema left greater than right he is at [facility] using silver alginate under Unna boot's I do not think there is  much change on this visit versus last time I saw him a month ago Readmission: 11/04/18 patient presents today for follow-up concerning his bilateral lower extremity ulcers. His a right medial ankle ulcer and a left leg that has ulcers over a large proportion of the surface area between the ankle and knee. Unfortunately this does cause some discomfort for the patient although it doesn't seem to be as uncomfortable as it has been in the past. He is seen today for evaluation after a referral by [place] back to the clinic. Unfortunately has a lot of Slough noted on the surface of his wounds. He's been treated currently it sounds like with Dakin's soaked gauze and they have not been performing the Unna Boot wrap that was previously recommended. I'm not exactly sure what the reason for the change was. He does have less slough on the surface of the wound but again this is something that is often a constant issue for  him. Again he's had these wounds for many years. I've known him for two years at least during that time when I was taking care of them in the facility he is Artie had the wounds for several years prior. READMISSION 02/25/2020 Josepha Pigg is a now 65 year old man. He has been in our clinic several times before most extensively in October 2016 through May 2017. At that time he had absolutely substantial bilateral lower extremity wounds secondary to chronic venous insufficiency and lymphedema. We ultimately referred him to [facility] he had a series of biopsies only showed suggestion of wound secondary to chronic venous insufficiency. He had a less extensive stay in our clinic in 2019 and then a single visit in February 2020. He is a resident at [facility] skilled facility. I think he has had intermittently had in facility wound care. He returns today. They are using silver alginate on the wounds although I am not sure what type of compression. Previously he has favored Unna boots. He is not felt to have an arterial issue. Past medical history; lymphedema and chronic venous insufficiency, diabetes mellitus, hypertension, congestive heart failure We did not do arterial studies today 04/27/2020 on evaluation patient appears to be doing really about the same as when I have known him and seen him in the past. He does have wounds on the right and left legs at this point. Fortunately there is no signs of active infection at this time. 9/2; 1 month follow-up. This is a man with severe chronic venous insufficiency and lymphedema. When I first met him he had horrible circumferential bilateral lower extremity ulcers. We have been using silver alginate up until early last month when it was changed to [facility] and Unna boot's more or less says maintenance dressings. Since the last time he was here the facility where he resides [facility] called to report they could no longer do Unna boot so he is apparently only  been receiving kerlix Coban the left leg is certainly a lot worse with almost circumferential large wounds especially lateral but now medial and posterior as well. He has a standard same looking wound on the right medial lower leg. 10/1; absolutely no improvement here. In fact I do not think anything much is changed. The substantial area on his left lateral and left posterior calf is still open may be slightly larger. He has a more modest wound on the right medial calf. None of these look any different. He comes in with a kerlix Coban wrap this will simply not  be adequate. He has too much edema in this leg He also is complaining a lot of pain in his left heel area. 11/8; potential wounds on the left lateral and posterior calf and a smaller oval wound in the right medial. We have been using Hydrofera Blue under 4-layer compression. He was using Unna boots still 2 months ago the facility called to say they can no longer place these so we have been using 4-layer compression. The surface area of the area on the left is so large there is very few alternatives to what we can use on this either silver alginate or Hydrofera Blue. He was complaining of pain on the left heel last time I asked for an x-ray this was apparently done although we do not have the result. He is not complaining of pain today. 08/16/2020 on evaluation today patient is is seen for a early visit due to the fact that he apparently is having issues I was told when they called on Monday with a MRSA infection that they felt like we needed to see him for because his legs "looked horrible". With that being said based on what I see at this point it does not appear that the patient is actually having a terrible infection in fact compared to last time I saw him his legs do not appear to be doing too poorly at all at this time. Nonetheless he has been on doxycycline that is not going to be a good option for him based on the culture report that I  have for review today which graded as a partial report with still several organisms pending as far as identification is concerned we did contact them but again they do not have the final report yet. Either way based on what we see it appears that doxycycline is one of the few medications that will not work for the Citrobacter. Obviously I think that a different medication would be a good option for him since there is a gram-negative organism pending I am likely can I suggest Cipro as the best of backup antibiotic to switch him to at this point. All this was discussed with the patient today. 12/20; not much change in the substantial wound on the left posterior calf and the small area on the right medial. He has a new area on the left anterior which looks like a wrap injury. Had some thoughts about doing a deep tissue culture here although we will put that off for next time. Using silver alginate as a primary dressing 2/10; substantial area on the left posterior calf. This measures smaller but still is a very large area. He has the area on the right anterior medial which also is a fairly large wound but much smaller than not on the left. His edema control is quite good. We have been using silver alginate because of the size of the wounds. Electronic Signature(s) Signed: 11/02/2020 5:13:40 PM By: Linton Ham MD Entered By: Linton Ham on 11/02/2020 14:38:45 -------------------------------------------------------------------------------- Physical Exam Details [name], [name] 11/02/2020 1:15 PM Medical Record Number: [id number] Patient Account Number: [id number] Date of Birth/Sex: Treating RN: 09-25-1955 (65 y.o. [name] Primary Care Provider: [name] Other Clinician: Referring Provider: Treating Provider/Extender: [name] in Treatment: 70 Constitutional Patient is hypertensive.. Pulse regular and within target  range for patient.Marland Kitchen Respirations regular, non-labored and within target range.. Temperature is normal and within the target range for the patient.Marland Kitchen Appears in  no distress. Notes Wound exam; no evidence of infection. His edema control is fairly good peripheral pulses are palpable. Substantial area on the left posterior calf however this wound looks clean and is measuring smaller. On the right medial lower leg I think is measuring smaller. No other wounds were seen. He has decent edema control bilaterally Electronic Signature(s) Signed: 11/02/2020 5:13:40 PM By: Linton Ham MD Entered By: Linton Ham on 11/02/2020 14:39:42 -------------------------------------------------------------------------------- Physician Orders Details [name], [name] 11/02/2020 1:15 PM Medical Record Number: [id number] Patient Account Number: [id number] Date of Birth/Sex: Treating RN: 10/02/1955 (65 y.o. [name] Primary Care Provider: [name] Other Clinician: Referring Provider: Treating Provider/Extender: [name] in Treatment: 18 Verbal / Phone Orders: No Diagnosis Coding ICD-10 Coding Code Description [id number] Chronic venous hypertension (idiopathic) with ulcer and inflammation of bilateral lower extremity I89.0 Lymphedema, not elsewhere classified L97.222 Non-pressure chronic ulcer of left calf with fat layer exposed L97.212 Non-pressure chronic ulcer of right calf with fat layer exposed Follow-up Appointments ppointment in: - 2 months Return A Bathing/ Shower/ Hygiene May shower with protection but do not get wound dressing(s) wet. Edema Control - Lymphedema / SCD / Other Elevate legs to the level of the heart or above for 30 minutes daily and/or when sitting, a frequency of: - throughout the day Avoid standing for long periods of time. Wound Treatment Wound #21 - Lower Leg Wound Laterality: Left, Distal Peri-Wound  Care: Zinc Oxide Ointment 30g tube 3 x Per Week/30 Days Discharge Instructions: Apply Zinc Oxide to periwound with each dressing change Peri-Wound Care: Sween Lotion (Moisturizing lotion) 3 x Per Week/30 Days Discharge Instructions: Apply moisturizing lotion as directed Prim Dressing: KerraCel Ag Gelling Fiber Dressing, 4x5 in (silver alginate) ary 3 x Per Week/30 Days Discharge Instructions: Apply silver alginate to wound bed as instructed Secondary Dressing: Woven Gauze Sponge, Non-Sterile 4x4 in 3 x Per Week/30 Days Discharge Instructions: Apply over primary dressing as directed. Secondary Dressing: ABD Pad, 8x10 3 x Per Week/30 Days Discharge Instructions: Apply over primary dressing as directed. Compression Wrap: FourPress (4 layer compression wrap) 3 x Per Week/30 Days Discharge Instructions: Apply four layer compression as directed. Wound #22 - Lower Leg Wound Laterality: Right, Medial Peri-Wound Care: Zinc Oxide Ointment 30g tube 3 x Per Week/30 Days Discharge Instructions: Apply Zinc Oxide to periwound with each dressing change Peri-Wound Care: Sween Lotion (Moisturizing lotion) 3 x Per Week/30 Days Discharge Instructions: Apply moisturizing lotion as directed Prim Dressing: KerraCel Ag Gelling Fiber Dressing, 4x5 in (silver alginate) 3 x Per Week/30 Days ary Discharge Instructions: Apply silver alginate to wound bed as instructed Secondary Dressing: Woven Gauze Sponge, Non-Sterile 4x4 in 3 x Per Week/30 Days Discharge Instructions: Apply over primary dressing as directed. Secondary Dressing: ABD Pad, 8x10 3 x Per Week/30 Days Discharge Instructions: Apply over primary dressing as directed. Compression Wrap: FourPress (4 layer compression wrap) 3 x Per Week/30 Days Discharge Instructions: Apply four layer compression as directed. Electronic Signature(s) Signed: 11/02/2020 5:13:40 PM By: Linton Ham MD Signed: 11/02/2020 6:08:01 PM By: [name] Entered By: [name]  on 11/02/2020 14:22:18 -------------------------------------------------------------------------------- Problem List Details [name], [name] 11/02/2020 1:15 PM Medical Record Number: [id number] Patient Account Number: [id number] Date of Birth/Sex: Treating RN: [birth year] (65 y.o. [name] Primary Care Provider: [name] Other Clinician: Referring Provider: Treating Provider/Extender: [name] in Treatment: 27 Active Problems ICD-10 Encounter Code  Description Active Date MDM Diagnosis I87.333 Chronic venous hypertension (idiopathic) with ulcer and inflammation of 02/25/2020 No Yes bilateral lower extremity I89.0 Lymphedema, not elsewhere classified 02/25/2020 No Yes L97.222 Non-pressure chronic ulcer of left calf with fat layer exposed 02/25/2020 No Yes L97.212 Non-pressure chronic ulcer of right calf with fat layer exposed 02/25/2020 No Yes Inactive Problems Resolved Problems Electronic Signature(s) Signed: 11/02/2020 5:13:40 PM By: Baltazar Najjar MD Entered By: Baltazar Najjar on 11/02/2020 14:37:18 -------------------------------------------------------------------------------- Progress Note Details [name], [name] 11/02/2020 1:15 PM Medical Record Number: [id number] Patient Account Number: [id number] Date of Birth/Sex: Treating RN: [birth year] (64 y.o. [name] Primary Care Provider: Leonie Douglas Other Clinician: Referring Provider: Treating Provider/Extender: Mikal Plane in Treatment: 35 Subjective History of Present Illness (HPI) 07/07/15; this is a patient who is in hospital on 8/2 through 8/4. He had cellulitis and abscess of predominantly I think the left leg. He received IV antibiotics. Plain x-ray showed no osteomyelitis. An MRI of the left leg did not show osteomyelitis. Cultures showed no predominant organism. His hemoglobin A1c  was 9.8. He has a history of venous stasis also peripheral vascular disease. He was discharged to [facility] nursing home. He really has extensive ulcerations on the left lateral leg including a major wound that communicates both posteriorly and superiorly w. When drainage coming out of 2 smaller areas. He has a smaller wound on the posterior medial left leg. He has more predominantly venous insufficiency wounds on predominantly the right medial leg above the ankle the right foot into sections. He has recently been put on Augmentin at the nursing home. Previous ABIs/arterial evaluation showed triphasic waves diffusely he does not have a major ischemic issue a left lower extremity venous duplex also exam showed no evidence of a DVT on 6/21 07/21/15; the patient arrives with really no major change. Culture I did last time was negative. He has not had a follow-up MRI I ordered. The wounds are macerated covered with a thick gelatinous surface slough. There is necrotic subcutaneous tissue 08/04/15; the patient arrives with a considerable improvement in the majority of his wound area. The area on the left leg now has what looks to be a granulated base. Most of the wounds on the left leg required an aggressive surgical debridement to remove nonviable fibrinous eschar and subcutaneous tissue however after debridement most of this looks better. Although I had asked for repeat MRI of the left leg when he first came in here with grossly purulent material coming out of his wounds, it does not appear that this is been done and nor do I actually feel that strongly about it right now. He has severe surrounding venous insufficiency and inflammation. I don't believe he has significant PAD 08/25/15. In general there is still a considerable wound area here but with still extensive service adherent slough. The patient will not allow mechanical debridement due to pain. He did not tolerate Medihoney therefore we are left  with Santyl for now. She would appear that he has severe surrounding venous stasis. There is no evidence of the infection that may have had something to do with the pathogenesis of these wounds 09/29/15; patient still has substantial wound area on the left leg with a cluster of several wounds on the lateral leg confluently on the left leg posteriorly and then a substantial wound on the medial leg. On the right leg a substantial wound medially and a small area on the right  lateral foot. All of these underwent a substantial surgical debridement with curettes which she tolerated better than he has in the past. This started as a complex cellulitis in the face of chronic venous insufficiency and inflammation. 10/13/15; substantial cluster of wounds on the lateral aspect of his left leg confluently around to the other side. Extensive surgical debridement to remove for redness surface slough nonviable subcutaneous tissue. This is not an improvement. Also substantial wound on the medial right leg which is largely unchanged. The etiology of this was felt to be a complex cellulitis in the summer of 2016 in the face of chronic venous insufficiency and inflammation. The patient states that the wound on the right medial leg has been there for years off and on. 10/20/15; we are able to start [facility] to these extensive wound areas left greater than right on Tuesday after I spoke to the wound care nurse at the facility. He arrives here today for extensive surgical debridement for the wounds on the lateral aspect of his left leg posterior left leg, this is almost circumferential. He has a large wound on the medial aspect of his right leg although he finds this too painful for debridement 10/27/15; I continue to bring this patient back for frequent debridement/weekly debridement severe. We have been using Hydrofera Blue. Unfortunately this has not really had any improvement. The debridement surgery difficult and  painful for the patient 11/23/15; this patient spent a complex hospitalization admitted with acute kidney injury, anemia, cellulitis of the lower extremity, he was felt to have sepsis pathophysiology although his blood cultures were negative. He had plain x-rays of both legs that were negative for osseous abnormalities. He was seen by infectious disease and placed on a workup for vasculitis that was negative including a biopsy o4 all were consistent with stasis dermatitis. Vital broad-spectrum antibiotics he continued to spike fevers. Urine and chest x-ray were negative Dopplers on admission ruled out a DVT All antibiotics were stopped on 2/17. . Since his return to [facility] skilled nursing facility I believe they have been using Xeroform 12/07/15 the patient arrives today with the area on his right medial leg actually looking quite stable. No debridement. The rest of his extensive wounds on the left lateral left posterior extending into the left medial leg all required extensive debridement. I think this is probably going to need to and up in the hands of plastic surgery. We'll attempt to change him back to [facility]. Arrange consultation with plastic surgery at [facility] for this almost circumferential wound on the left side 12/21/15 right leg covered in surface slough. This was debridement. Left leg extensive wounds all carefully examined. This is almost circumferential on the left side especially on the posterior calf. No debridement is necessary. 01/04/16 the patient has been to [facility] plastic surgery. Unfortunately it doesn't look like they had any of the prior workup on this patient. They're in the middle of vascular workup. It sounds as though they're applying Hydrofera Blue at the nursing home. 01/22/16; the patient has been back to see [facility]. There apparently making plans to possibly do skin grafts over his large venous insufficiency ulcerations. The patient tells me that he  goes to hematology in [facility] and variably uses the term platelets red cells and the description of his problem. 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 I do not see any major difference in the wound on the right medial leg and circumferentially  across the left medial to left lateral leg. Did not attempt to debride these today. Readmission: 05/05/18 on evaluation today patient presents for reevaluation here in our clinic although I have previously seen him in the skilled nursing facility over the past year and a half when I was working in the skilled nursing facility realm instead of covering in the clinic. With that being said during that time we had initiated most recently dressings with Hydrofera Blue Dressing along with the [employer] which had done very well for him. Much better than the Kerlex Coban wraps. With that being said the patient had been doing fairly well and was making progress when I last saw him. Upon evaluation today it appears that the wounds are actually a little bit worse than when I last saw him although definitely not dramatically so. There does not appear to be any evidence of infection which is good news. With that being said he has been tolerating the wraps without complication his left hip still gives him a lot of trouble which is his main issue as far as movement is concerned. Unbeknownst to me he has not had venous studies that I can find. He previously told me he had there were arterial studies noted back from 04/27/15 which showed that he had try phasic blood flow throughout and again his test appear to be completely normal as performed by Dr. Creig Hines. With that being said I cannot find where he had venous studies. The patient still states he thinks he did these definitely had no venous intervention at this point. Upon evaluation today it does not appear to me that the patient has any evidence of cellulitis. He does have  some chronic venous insufficiency which I think has led to stasis dermatitis but again this is not appearing to be infected at this point. No fevers, chills, nausea, or vomiting noted at this time. READMISSION 06/30/2018 This is a now 65 year old man we have had in this clinic at multiple times in the past. Most recently he came in August and was seen by [facility] stone. It is not clear why he did not come back we asked him really did not get a straight answer. He is at [facility] skilled facility he is a man who has severe chronic venous insufficiency with lymphedema and has had severe wounds on his left greater than right leg. We had him here in 2016 and 17 ultimately referred him to [facility]. He had arterial studies and venous studies done at [facility] although I am not able to access these records I see they are actually done. He also had multiple biopsies that were negative for malignancy showing changes compatible with chronic inflammation. As I understand things in [place] they are using Iodoflex and Unna boots. I am not really sure how they are getting enough Iodoflex for the large wound area especially on his left calf. Nevertheless the wounds look better than when I saw this in 2017. There were plans for him to have plastic surgery in 2018 and I think he was actually prepped for surgery however he was ultimately denied because the patient was an active smoker. The patient is not systemically unwell. The wounds are painful however given the size especially on the left this is not surprising. ABIs in this clinic were 0.78 on the left and 0.91 on the right 07/13/2018; this is a patient with bilateral severe venous ulcers with secondary lymphedema. The wounds are on the right medial  calf and a substantial part of the left posterior calf and some involvement medially and laterally on the left. We changed him to silver alginate under Unna boot's last time. The wounds actually look fairly  satisfactory today. 08/14/2018; this is a patient with severe bilateral venous stasis ulcers with secondary lymphedema left greater than right he is at [facility] using silver alginate under Unna boot's I do not think there is much change on this visit versus last time I saw him a month ago Readmission: 11/04/18 patient presents today for follow-up concerning his bilateral lower extremity ulcers. His a right medial ankle ulcer and a left leg that has ulcers over a large proportion of the surface area between the ankle and knee. Unfortunately this does cause some discomfort for the patient although it doesn't seem to be as uncomfortable as it has been in the past. He is seen today for evaluation after a referral by [place] back to the clinic. Unfortunately has a lot of Slough noted on the surface of his wounds. He's been treated currently it sounds like with Dakin's soaked gauze and they have not been performing the Unna Boot wrap that was previously recommended. I'm not exactly sure what the reason for the change was. He does have less slough on the surface of the wound but again this is something that is often a constant issue for him. Again he's had these wounds for many years. I've known him for two years at least during that time when I was taking care of them in the facility he is Artie had the wounds for several years prior. READMISSION 02/25/2020 Josepha Pigg is a now 65 year old man. He has been in our clinic several times before most extensively in October 2016 through May 2017. At that time he had absolutely substantial bilateral lower extremity wounds secondary to chronic venous insufficiency and lymphedema. We ultimately referred him to [facility] he had a series of biopsies only showed suggestion of wound secondary to chronic venous insufficiency. He had a less extensive stay in our clinic in 2019 and then a single visit in February 2020. He is a resident at [facility] skilled facility. I  think he has had intermittently had in facility wound care. He returns today. They are using silver alginate on the wounds although I am not sure what type of compression. Previously he has favored Unna boots. He is not felt to have an arterial issue. Past medical history; lymphedema and chronic venous insufficiency, diabetes mellitus, hypertension, congestive heart failure We did not do arterial studies today 04/27/2020 on evaluation patient appears to be doing really about the same as when I have known him and seen him in the past. He does have wounds on the right and left legs at this point. Fortunately there is no signs of active infection at this time. 9/2; 1 month follow-up. This is a man with severe chronic venous insufficiency and lymphedema. When I first met him he had horrible circumferential bilateral lower extremity ulcers. We have been using silver alginate up until early last month when it was changed to [facility] and Unna boot's more or less says maintenance dressings. Since the last time he was here the facility where he resides [facility] called to report they could no longer do Unna boot so he is apparently only been receiving kerlix Coban the left leg is certainly a lot worse with almost circumferential large wounds especially lateral but now medial and posterior as well. He has a  standard same looking wound on the right medial lower leg. 10/1; absolutely no improvement here. In fact I do not think anything much is changed. The substantial area on his left lateral and left posterior calf is still open may be slightly larger. He has a more modest wound on the right medial calf. None of these look any different. He comes in with a kerlix Coban wrap this will simply not be adequate. He has too much edema in this leg He also is complaining a lot of pain in his left heel area. 11/8; potential wounds on the left lateral and posterior calf and a smaller oval wound in the right  medial. We have been using Hydrofera Blue under 4-layer compression. He was using Unna boots still 2 months ago the facility called to say they can no longer place these so we have been using 4-layer compression. The surface area of the area on the left is so large there is very few alternatives to what we can use on this either silver alginate or Hydrofera Blue. He was complaining of pain on the left heel last time I asked for an x-ray this was apparently done although we do not have the result. He is not complaining of pain today. 08/16/2020 on evaluation today patient is is seen for a early visit due to the fact that he apparently is having issues I was told when they called on Monday with a MRSA infection that they felt like we needed to see him for because his legs "looked horrible". With that being said based on what I see at this point it does not appear that the patient is actually having a terrible infection in fact compared to last time I saw him his legs do not appear to be doing too poorly at all at this time. Nonetheless he has been on doxycycline that is not going to be a good option for him based on the culture report that I have for review today which graded as a partial report with still several organisms pending as far as identification is concerned we did contact them but again they do not have the final report yet. Either way based on what we see it appears that doxycycline is one of the few medications that will not work for the Citrobacter. Obviously I think that a different medication would be a good option for him since there is a gram-negative organism pending I am likely can I suggest Cipro as the best of backup antibiotic to switch him to at this point. All this was discussed with the patient today. 12/20; not much change in the substantial wound on the left posterior calf and the small area on the right medial. He has a new area on the left anterior which looks like a wrap  injury. Had some thoughts about doing a deep tissue culture here although we will put that off for next time. Using silver alginate as a primary dressing 2/10; substantial area on the left posterior calf. This measures smaller but still is a very large area. He has the area on the right anterior medial which also is a fairly large wound but much smaller than not on the left. His edema control is quite good. We have been using silver alginate because of the size of the wounds. Objective Constitutional Patient is hypertensive.. Pulse regular and within target range for patient.Marland Kitchen Respirations regular, non-labored and within target range.. Temperature is normal and within the target range for the patient.Marland Kitchen  Appears in no distress. Vitals Time Taken: 1:53 PM, Height: 77 in, Weight: 254 lbs, BMI: 30.1, Temperature: 98.4 F, Pulse: 73 bpm, Respiratory Rate: 17 breaths/min, Blood Pressure: 148/78 mmHg, Capillary Blood Glucose: 224 mg/dl. General Notes: Wound exam; no evidence of infection. His edema control is fairly good peripheral pulses are palpable. Substantial area on the left posterior calf however this wound looks clean and is measuring smaller. ooOn the right medial lower leg I think is measuring smaller. No other wounds were seen. He has decent edema control bilaterally Integumentary (Hair, Skin) Wound #21 status is Open. Original cause of wound was Gradually Appeared. The wound is located on the Left,Distal Lower Leg. The wound measures 16.5cm length x 17cm width x 0.5cm depth; 220.304cm^2 area and 110.152cm^3 volume. There is Fat Layer (Subcutaneous Tissue) exposed. There is no tunneling or undermining noted. There is a large amount of serosanguineous drainage noted. The wound margin is distinct with the outline attached to the wound base. There is large (67-100%) pink granulation within the wound bed. There is a small (1-33%) amount of necrotic tissue within the wound bed including Adherent  Slough. Wound #22 status is Open. Original cause of wound was Gradually Appeared. The wound is located on the Right,Medial Lower Leg. The wound measures 4.5cm length x 2.6cm width x 0.1cm depth; 9.189cm^2 area and 0.919cm^3 volume. There is Fat Layer (Subcutaneous Tissue) exposed. There is no tunneling or undermining noted. There is a medium amount of serosanguineous drainage noted. The wound margin is flat and intact. There is large (67-100%) pink granulation within the wound bed. There is a small (1-33%) amount of necrotic tissue within the wound bed including Adherent Slough. Wound #23 status is Healed - Epithelialized. Original cause of wound was Trauma. The wound is located on the Left,Medial Lower Leg. The wound measures 0cm length x 0cm width x 0cm depth; 0cm^2 area and 0cm^3 volume. Assessment Active Problems ICD-10 Chronic venous hypertension (idiopathic) with ulcer and inflammation of bilateral lower extremity Lymphedema, not elsewhere classified Non-pressure chronic ulcer of left calf with fat layer exposed Non-pressure chronic ulcer of right calf with fat layer exposed Procedures Wound #21 Pre-procedure diagnosis of Wound #21 is a Venous Leg Ulcer located on the Left,Distal Lower Leg . There was a Four Layer Compression Therapy Procedure by [name], RN. Post procedure Diagnosis Wound #21: Same as Pre-Procedure Wound #22 Pre-procedure diagnosis of Wound #22 is a Venous Leg Ulcer located on the Right,Medial Lower Leg . There was a Four Layer Compression Therapy Procedure by [name], RN. Post procedure Diagnosis Wound #22: Same as Pre-Procedure Plan Follow-up Appointments: Return Appointment in: - 2 months Bathing/ Shower/ Hygiene: May shower with protection but do not get wound dressing(s) wet. Edema Control - Lymphedema / SCD / Other: Elevate legs to the level of the heart or above for 30 minutes daily and/or when sitting, a frequency of: - throughout the  day Avoid standing for long periods of time. WOUND #21: - Lower Leg Wound Laterality: Left, Distal Peri-Wound Care: Zinc Oxide Ointment 30g tube 3 x Per Week/30 Days Discharge Instructions: Apply Zinc Oxide to periwound with each dressing change Peri-Wound Care: Sween Lotion (Moisturizing lotion) 3 x Per Week/30 Days Discharge Instructions: Apply moisturizing lotion as directed Prim Dressing: KerraCel Ag Gelling Fiber Dressing, 4x5 in (silver alginate) 3 x Per Week/30 Days ary Discharge Instructions: Apply silver alginate to wound bed as instructed Secondary Dressing: Woven Gauze Sponge, Non-Sterile 4x4 in 3 x Per Week/30 Days Discharge Instructions:  Apply over primary dressing as directed. Secondary Dressing: ABD Pad, 8x10 3 x Per Week/30 Days Discharge Instructions: Apply over primary dressing as directed. Com pression Wrap: FourPress (4 layer compression wrap) 3 x Per Week/30 Days Discharge Instructions: Apply four layer compression as directed. WOUND #22: - Lower Leg Wound Laterality: Right, Medial Peri-Wound Care: Zinc Oxide Ointment 30g tube 3 x Per Week/30 Days Discharge Instructions: Apply Zinc Oxide to periwound with each dressing change Peri-Wound Care: Sween Lotion (Moisturizing lotion) 3 x Per Week/30 Days Discharge Instructions: Apply moisturizing lotion as directed Prim Dressing: KerraCel Ag Gelling Fiber Dressing, 4x5 in (silver alginate) 3 x Per Week/30 Days ary Discharge Instructions: Apply silver alginate to wound bed as instructed Secondary Dressing: Woven Gauze Sponge, Non-Sterile 4x4 in 3 x Per Week/30 Days Discharge Instructions: Apply over primary dressing as directed. Secondary Dressing: ABD Pad, 8x10 3 x Per Week/30 Days Discharge Instructions: Apply over primary dressing as directed. Com pression Wrap: FourPress (4 layer compression wrap) 3 x Per Week/30 Days Discharge Instructions: Apply four layer compression as directed. 1. Because of the large wound area I  continued with silver alginate the other other option would be Hydrofera Blue bed in a skilled facility it would be unlikely for [place] to be able to get this. 2. There is really no additional choices here because of the large volume. I do not think he is a candidate for plastic surgery at this point 3. These areas were all aggressively managed several years ago including biopsies there was no evidence for malignancy Electronic Signature(s) Signed: 11/02/2020 5:13:40 PM By: Linton Ham MD Entered By: Linton Ham on 11/02/2020 14:41:17 -------------------------------------------------------------------------------- SuperBill Details [name], [name] 11/02/2020 Medical Record Number: [id number] Patient Account Number: [id number] Date of Birth/Sex: Treating RN: [id number] (65 y.o. [name] Primary Care Provider: [name] Other Clinician: Referring Provider: Treating Provider/Extender: [name] in Treatment: 35 Diagnosis Coding ICD-10 Codes Code Description [id number] Chronic venous hypertension (idiopathic) with ulcer and inflammation of bilateral lower extremity I89.0 Lymphedema, not elsewhere classified L97.222 Non-pressure chronic ulcer of left calf with fat layer exposed L97.212 Non-pressure chronic ulcer of right calf with fat layer exposed Facility Procedures CPT4: Code [id number] foot Description: 1 BILATERAL: Application of multi-layer venous compression system; leg (below knee), including ankle and . Modifier: Quantity: 1 Physician Procedures : CPT4 Code Description Modifier [id number] - WC PHYS LEVEL 3 - EST PT ICD-10 Diagnosis Description L97.222 Non-pressure chronic ulcer of left calf with fat layer exposed L97.212 Non-pressure chronic ulcer of right calf with fat layer exposed I87.333  Chronic venous hypertension (idiopathic) with ulcer and inflammation of bilateral lower extremity Quantity:  1 Electronic Signature(s) Signed: 11/02/2020 5:13:40 PM By: Linton Ham MD Entered By: Linton Ham on 11/02/2020 14:41:40

## 2020-11-03 NOTE — Progress Notes (Addendum)
[name], [name] ([id number]) Visit Report for 11/02/2020 Arrival Information Details Patient Name: Date of Service: HEA RDTyna Jaksch 11/02/2020 1:15 PM Medical Record Number: [id number] Patient Account Number: [id number] Date of Birth/Sex: Treating RN: 03-01-1956 (65 y.o. [name] Primary Care Alfonzia Woolum: [name] Other Clinician: Referring Crescent Gotham: Treating Ansen Sayegh/Extender: [name] in Treatment: 82 Visit Information History Since Last Visit Added or deleted any medications: No Patient Arrived: Wheel Chair Any new allergies or adverse reactions: No Arrival Time: 13:52 Had a fall or experienced change in No Accompanied By: self activities of daily living that may affect Transfer Assistance: None risk of falls: Patient Identification Verified: Yes Signs or symptoms of abuse/neglect since last visito No Secondary Verification Process Completed: Yes Hospitalized since last visit: No Patient Requires Transmission-Based Precautions: No Implantable device outside of the clinic excluding No Patient Has Alerts: No cellular tissue based products placed in the center since last visit: Has Dressing in Place as Prescribed: Yes Pain Present Now: Yes Electronic Signature(s) Signed: 11/03/2020 1:25:35 PM By: Sandre Kitty Entered By: Sandre Kitty on 11/02/2020 13:53:16 -------------------------------------------------------------------------------- Compression Therapy Details [name], [name] 11/02/2020 1:15 PM Medical Record Number: [id number] Patient Account Number: [id number] Date of Birth/Sex: Treating RN: 09/15/1956 (65 y.o. [name] Primary Care Tai Skelly: [name] Other Clinician: Referring Marcianne Ozbun: Treating Kaytlynne Neace/Extender: [name] in Treatment: 35 Compression Therapy Performed for Wound Assessment: Wound #21 Left,Distal Lower Leg Performed By: Clinician  [name], RN Compression Type: Four Layer Post Procedure Diagnosis Same as Pre-procedure Electronic Signature(s) Signed: 11/02/2020 6:08:01 PM By: [name] Entered By: [name] on 11/02/2020 14:21:35 -------------------------------------------------------------------------------- Compression Therapy Details [name], [name] 11/02/2020 1:15 PM Medical Record Number: [id number] Patient Account Number: [id number] Date of Birth/Sex: Treating RN: 1956-06-26 (65 y.o. [name] Primary Care Maaliyah Adolph: [name] Other Clinician: Referring Shameca Landen: Treating Brookes Craine/Extender: [name] in Treatment: 35 Compression Therapy Performed for Wound Assessment: Wound #22 Right,Medial Lower Leg Performed By: Clinician [name], RN Compression Type: Four Layer Post Procedure Diagnosis Same as Pre-procedure Electronic Signature(s) Signed: 11/02/2020 6:08:01 PM By: [name] Entered By: [name] on 11/02/2020 14:21:35 -------------------------------------------------------------------------------- Encounter Discharge Information Details [name], [name] 11/02/2020 1:15 PM Medical Record Number: [id number] Patient Account Number: [id number] Date of Birth/Sex: Treating RN: 1956/05/29 (65 y.o. [name] Primary Care Rylie Limburg: [name] Other Clinician: Referring Taneika Choi: Treating Jaedyn Lard/Extender: [name] in Treatment: 105 Encounter Discharge Information Items Discharge Condition: Stable Ambulatory Status: Wheelchair Discharge Destination: Skilled Nursing Facility Telephoned: No Orders Sent: Yes Transportation: Other Accompanied By: facility staff Schedule Follow-up Appointment: Yes Clinical Summary of Care: Patient Declined Notes facility transportation Electronic Signature(s) Signed: 11/02/2020 5:55:42 PM By: [name] RN, BSN Entered By: [name] on 11/02/2020 15:05:44 -------------------------------------------------------------------------------- Lower Extremity Assessment Details [name], [name] 11/02/2020 1:15 PM Medical Record Number: [id number] Patient Account Number: [id number] Date of Birth/Sex: Treating RN: [birth year] (65 y.o. [name] Primary Care Yaqub Arney: [name] Other Clinician: Referring Allister Lessley: Treating Axil Copeman/Extender: Bayard Hugger Weeks in Treatment: 35 Edema Assessment Assessed: Shirlyn Goltz: Yes] Patrice Paradise: Yes] Edema: [Left: Yes] [Right: Yes] Calf Left: Right: Point of Measurement: 52 cm From Medial Instep 41.4 cm 40 cm Ankle Left: Right: Point of Measurement: 10 cm From Medial Instep 25 cm 26 cm Electronic Signature(s) Signed: 11/02/2020 6:08:01 PM By:  Deaton, Bobbi Entered By: [name] on 11/02/2020 14:13:30 -------------------------------------------------------------------------------- Multi Wound Chart Details [name], [name] 11/02/2020 1:15 PM Medical Record Number: [id number] Patient Account Number: [id number] Date of Birth/Sex: Treating RN: 09/21/1956 (65 y.o. [name], Meta.Reding Primary Care Karyss Frese: [name] Other Clinician: Referring Chanceler Pullin: Treating Reine Bristow/Extender: [name] in Treatment: 35 Vital Signs Height(in): 77 Capillary Blood Glucose(mg/dl): 224 Weight(lbs): 254 Pulse(bpm): 73 Body Mass Index(BMI): 30 Blood Pressure(mmHg): 148/78 Temperature(F): 98.4 Respiratory Rate(breaths/min): 17 Photos: [21:No Photos Left, Distal Lower Leg] [22:No Photos Right, Medial Lower Leg] [23:No Photos Left, Medial Lower Leg] Wound Location: [21:Gradually Appeared] [22:Gradually Appeared] [23:Trauma] Wounding Event: [21:Venous Leg Ulcer] [22:Venous Leg Ulcer] [23:Skin Tear] Primary Etiology: [21:Anemia, Sleep Apnea,  Congestive] [22:Anemia, Sleep Apnea, Congestive] [23:N/A] Comorbid History: [21:Heart Failure, Hypertension, Peripheral Heart Failure, Hypertension, Peripheral Venous Disease, Type II Diabetes, Osteoarthritis, Neuropathy 11/22/2019] [22:Venous Disease, Type II Diabetes, Osteoarthritis, Neuropathy 12/23/2019]  [23:09/08/2020] Date Acquired: [21:35] [22:35] [23:7] Weeks of Treatment: [21:Open] [22:Open] [23:Healed - Epithelialized] Wound Status: [21:16.5x17x0.5] [22:4.5x2.6x0.1] [23:0x0x0] Measurements L x W x D (cm) [54:627.035] [22:9.189] [23:0] A (cm) : rea [21:110.152] [22:0.919] [23:0] Volume (cm) : [21:-40.20%] [22:59.40%] [23:100.00%] % Reduction in Area: [21:-16.90%] [22:79.70%] [23:100.00%] % Reduction in Volume: [21:Full Thickness Without Exposed] [22:Full Thickness Without Exposed] [23:Partial Thickness] Classification: [21:Support Structures Large] [22:Support Structures Medium] [23:N/A] Exudate Amount: [21:Serosanguineous] [22:Serosanguineous] [23:N/A] Exudate Type: [21:red, brown] [22:red, brown] [23:N/A] Exudate Color: [21:Distinct, outline attached] [22:Flat and Intact] [23:N/A] Wound Margin: [21:Large (67-100%)] [22:Large (67-100%)] [23:N/A] Granulation Amount: [21:Pink] [22:Pink] [23:N/A] Granulation Quality: [21:Small (1-33%)] [22:Small (1-33%)] [23:N/A] Necrotic Amount: [21:Fat Layer (Subcutaneous Tissue): Yes Fat Layer (Subcutaneous Tissue): Yes N/A] Exposed Structures: [21:Fascia: No Tendon: No Muscle: No Joint: No Bone: No Small (1-33%)] [22:Fascia: No Tendon: No Muscle: No Joint: No Bone: No Small (1-33%)] [23:N/A] Epithelialization: [21:Compression Therapy] [22:Compression Therapy] [23:N/A] Treatment Notes Electronic Signature(s) Signed: 11/02/2020 5:13:40 PM By: Linton Ham MD Signed: 11/02/2020 6:08:01 PM By: [name] Entered By: Linton Ham on 11/02/2020  14:37:33 -------------------------------------------------------------------------------- Multi-Disciplinary Care Plan Details [name], [name] 11/02/2020 1:15 PM Medical Record Number: [id number] Patient Account Number: [id number] Date of Birth/Sex: Treating RN: [birth year] (65 y.o. [name] Primary Care Kaesen Rodriguez: [name] Other Clinician: Referring Jakota Manthei: Treating Tajon Moring/Extender: [name] in Treatment: 35 Active Inactive Wound/Skin Impairment Nursing Diagnoses: Impaired tissue integrity Goals: Patient/caregiver will verbalize understanding of skin care regimen Date Initiated: 04/27/2020 Target Resolution Date: 01/19/2021 Goal Status: Active Ulcer/skin breakdown will have a volume reduction of 50% by week 8 Date Initiated: 02/25/2020 Date Inactivated: 04/27/2020 Target Resolution Date: 04/14/2020 Goal Status: Met Interventions: Provide education on ulcer and skin care Notes: Electronic Signature(s) Signed: 11/02/2020 6:08:01 PM By: [name] Entered By: [name] on 11/02/2020 14:17:01 -------------------------------------------------------------------------------- Pain Assessment Details [name], [name] 11/02/2020 1:15 PM Medical Record Number: [id number] Patient Account Number: [id number] Date of Birth/Sex: Treating RN: 1956/04/23 (65 y.o. [name] Primary Care Yuna Pizzolato: [name] Other Clinician: Referring Shirl Ludington: Treating Camisha Srey/Extender: [name] in Treatment: 35 Active Problems Location of Pain Severity and Description of Pain Patient Has Paino Yes Site Locations Rate the pain. Rate the pain. Current Pain Level: 7 Pain Management and Medication Current Pain Management: Electronic Signature(s) Signed: 11/02/2020 6:08:01 PM By: [name] Signed: 11/03/2020 1:25:35 PM By: Sandre Kitty Entered  By: Sandre Kitty on 11/02/2020 13:54:07 -------------------------------------------------------------------------------- Patient/Caregiver Education Details Patient Name: Date of Service:  [name], [name] 2/10/2022andnbsp1:15 PM Medical Record Number: [id number] Patient Account Number: [id number] Date of Birth/Gender: Treating RN: 04-11-1956 (65 y.o. [name] Primary Care Physician: [name] Other Clinician: Referring Physician: Treating Physician/Extender: [name] in Treatment: 88 Education Assessment Education Provided To: Patient Education Topics Provided Wound/Skin Impairment: Handouts: Skin Care Do's and Dont's Methods: Explain/Verbal Responses: Reinforcements needed Electronic Signature(s) Signed: 11/02/2020 6:08:01 PM By: [name] Entered By: [name] on 11/02/2020 14:17:13 -------------------------------------------------------------------------------- Wound Assessment Details [name], [name] 11/02/2020 1:15 PM Medical Record Number: [id number] Patient Account Number: [id number] Date of Birth/Sex: Treating RN: 1955-10-30 (65 y.o. [name] Primary Care Kimsey Demaree: [name] Other Clinician: Referring Ahsha Hinsley: Treating Adonai Selsor/Extender: [name] in Treatment: 35 Wound Status Wound Number: 21 Primary Venous Leg Ulcer Etiology: Wound Location: Left, Distal Lower Leg Wound Open Wounding Event: Gradually Appeared Status: Date Acquired: 11/22/2019 Comorbid Anemia, Sleep Apnea, Congestive Heart Failure, Hypertension, Weeks Of Treatment: 35 History: Peripheral Venous Disease, Type II Diabetes, Osteoarthritis, Clustered Wound: No Neuropathy Photos Wound Measurements Length: (cm) 16.5 Width: (cm) 17 Depth: (cm) 0.5 Area: (cm) 220.304 Volume: (cm) 110.152 % Reduction in Area: -40.2% % Reduction in Volume: -16.9% Epithelialization:  Small (1-33%) Tunneling: No Undermining: No Wound Description Classification: Full Thickness Without Exposed Support Structures Wound Margin: Distinct, outline attached Exudate Amount: Large Exudate Type: Serosanguineous Exudate Color: red, brown Foul Odor After Cleansing: No Slough/Fibrino Yes Wound Bed Granulation Amount: Large (67-100%) Exposed Structure Granulation Quality: Pink Fascia Exposed: No Necrotic Amount: Small (1-33%) Fat Layer (Subcutaneous Tissue) Exposed: Yes Necrotic Quality: Adherent Slough Tendon Exposed: No Muscle Exposed: No Joint Exposed: No Bone Exposed: No Treatment Notes Wound #21 (Lower Leg) Wound Laterality: Left, Distal Cleanser Peri-Wound Care Zinc Oxide Ointment 30g tube Discharge Instruction: Apply Zinc Oxide to periwound with each dressing change Sween Lotion (Moisturizing lotion) Discharge Instruction: Apply moisturizing lotion as directed Topical Primary Dressing KerraCel Ag Gelling Fiber Dressing, 4x5 in (silver alginate) Discharge Instruction: Apply silver alginate to wound bed as instructed Secondary Dressing Woven Gauze Sponge, Non-Sterile 4x4 in Discharge Instruction: Apply over primary dressing as directed. ABD Pad, 8x10 Discharge Instruction: Apply over primary dressing as directed. Secured With Compression Wrap FourPress (4 layer compression wrap) Discharge Instruction: Apply four layer compression as directed. Compression Stockings Add-Ons Electronic Signature(s) Signed: 11/06/2020 3:45:04 PM By: Mikeal Hawthorne EMT/HBOT/SD Signed: 11/06/2020 5:25:06 PM By: [name] Previous Signature: 11/02/2020 6:08:01 PM Version By: [name] Entered By: Mikeal Hawthorne on 11/06/2020 15:04:56 -------------------------------------------------------------------------------- Wound Assessment Details [name], [name] 11/02/2020 1:15 PM Medical Record Number: [id number] Patient Account Number:  [id number] Date of Birth/Sex: Treating RN: September 24, 1955 (65 y.o. [name] Primary Care Quinci Gavidia: [name] Other Clinician: Referring Deannie Resetar: Treating Amanpreet Delmont/Extender: [name] in Treatment: 35 Wound Status Wound Number: 22 Primary Venous Leg Ulcer Etiology: Wound Location: Right, Medial Lower Leg Wound Open Wounding Event: Gradually Appeared Status: Date Acquired: 12/23/2019 Comorbid Anemia, Sleep Apnea, Congestive Heart Failure, Hypertension, Weeks Of Treatment: 35 History: Peripheral Venous Disease, Type II Diabetes, Osteoarthritis, Clustered Wound: No Neuropathy Photos Photo Uploaded By: Mikeal Hawthorne on 11/06/2020 15:04:04 Wound Measurements Length: (cm) 4.5 Width: (cm) 2.6 Depth: (cm) 0.1 Area: (cm) 9.189 Volume: (cm) 0.919 % Reduction in Area: 59.4% % Reduction in Volume: 79.7% Epithelialization: Small (1-33%) Tunneling: No Undermining: No Wound Description Classification: Full Thickness Without Exposed Support Structures Wound Margin: Flat and Intact Exudate Amount: Medium  Exudate Type: Serosanguineous Exudate Color: red, brown Foul Odor After Cleansing: No Slough/Fibrino Yes Wound Bed Granulation Amount: Large (67-100%) Exposed Structure Granulation Quality: Pink Fascia Exposed: No Necrotic Amount: Small (1-33%) Fat Layer (Subcutaneous Tissue) Exposed: Yes Necrotic Quality: Adherent Slough Tendon Exposed: No Muscle Exposed: No Joint Exposed: No Bone Exposed: No Treatment Notes Wound #22 (Lower Leg) Wound Laterality: Right, Medial Cleanser Peri-Wound Care Zinc Oxide Ointment 30g tube Discharge Instruction: Apply Zinc Oxide to periwound with each dressing change Sween Lotion (Moisturizing lotion) Discharge Instruction: Apply moisturizing lotion as directed Topical Primary Dressing KerraCel Ag Gelling Fiber Dressing, 4x5 in (silver alginate) Discharge Instruction: Apply silver alginate to wound bed as  instructed Secondary Dressing Woven Gauze Sponge, Non-Sterile 4x4 in Discharge Instruction: Apply over primary dressing as directed. ABD Pad, 8x10 Discharge Instruction: Apply over primary dressing as directed. Secured With Compression Wrap FourPress (4 layer compression wrap) Discharge Instruction: Apply four layer compression as directed. Compression Stockings Add-Ons Electronic Signature(s) Signed: 11/02/2020 6:08:01 PM By: [name] Entered By: [name] on 11/02/2020 14:15:13 -------------------------------------------------------------------------------- Wound Assessment Details [name], [name] 11/02/2020 1:15 PM Medical Record Number: [id number] Patient Account Number: [id number] Date of Birth/Sex: Treating RN: 1956-03-28 (65 y.o. [name] Primary Care Amonte Brookover: [name] Other Clinician: Referring Floride Hutmacher: Treating Fernando Torry/Extender: Bayard Hugger Weeks in Treatment: 35 Wound Status Wound Number: 23 Primary Etiology: Skin Tear Wound Location: Left, Medial Lower Leg Wound Status: Healed - Epithelialized Wounding Event: Trauma Date Acquired: 09/08/2020 Weeks Of Treatment: 7 Clustered Wound: No Photos Photo Uploaded By: Mikeal Hawthorne on 11/06/2020 15:04:05 Wound Measurements Length: (cm) Width: (cm) Depth: (cm) Area: (cm) Volume: (cm) 0 % Reduction in Area: 100% 0 % Reduction in Volume: 100% 0 0 0 Wound Description Classification: Partial Thickness Treatment Notes Wound #23 (Lower Leg) Wound Laterality: Left, Medial Cleanser Peri-Wound Care Topical Primary Dressing Secondary Dressing Secured With Compression Wrap Compression Stockings Add-Ons Electronic Signature(s) Signed: 11/02/2020 6:08:01 PM By: [name] Entered By: [name] on 11/02/2020 14:13:56 -------------------------------------------------------------------------------- Vitals Details Patient  Name: Date of Service: [name], [name] 11/02/2020 1:15 PM Medical Record Number: [id number] Patient Account Number: [id number] Date of Birth/Sex: Treating RN: 07-09-1956 (65 y.o. [name] Primary Care Justus Droke: [name] Other Clinician: Referring Alysabeth Scalia: Treating Alioune Hodgkin/Extender: [name] in Treatment: 35 Vital Signs Time Taken: 13:53 Temperature (F): 98.4 Height (in): 77 Pulse (bpm): 73 Weight (lbs): 254 Respiratory Rate (breaths/min): 17 Body Mass Index (BMI): 30.1 Blood Pressure (mmHg): 148/78 Capillary Blood Glucose (mg/dl): 224 Reference Range: 80 - 120 mg / dl Electronic Signature(s) Signed: 11/03/2020 1:25:35 PM By: Sandre Kitty Entered By: Sandre Kitty on 11/02/2020 13:53:51

## 2020-12-28 ENCOUNTER — Encounter (HOSPITAL_BASED_OUTPATIENT_CLINIC_OR_DEPARTMENT_OTHER): Payer: Medicare Other | Attending: Internal Medicine | Admitting: Internal Medicine

## 2020-12-28 ENCOUNTER — Other Ambulatory Visit: Payer: Self-pay

## 2020-12-28 DIAGNOSIS — I890 Lymphedema, not elsewhere classified: Secondary | ICD-10-CM | POA: Diagnosis not present

## 2020-12-28 DIAGNOSIS — I87333 Chronic venous hypertension (idiopathic) with ulcer and inflammation of bilateral lower extremity: Secondary | ICD-10-CM | POA: Diagnosis not present

## 2020-12-28 DIAGNOSIS — L97212 Non-pressure chronic ulcer of right calf with fat layer exposed: Secondary | ICD-10-CM | POA: Insufficient documentation

## 2020-12-28 DIAGNOSIS — L97222 Non-pressure chronic ulcer of left calf with fat layer exposed: Secondary | ICD-10-CM | POA: Insufficient documentation

## 2020-12-28 DIAGNOSIS — L97229 Non-pressure chronic ulcer of left calf with unspecified severity: Secondary | ICD-10-CM | POA: Diagnosis present

## 2020-12-29 NOTE — Progress Notes (Addendum)
[place] ([id number]) Visit Report for 12/28/2020 Arrival Information Details [name], [name] 12/28/2020 2:00 PM Medical Record Number: [id number] Patient Account Number: [id number] Date of Birth/Sex: Treating RN: 1956/07/20 (65 y.o. [name] Primary Care Ryli Standlee: [name] Other Clinician: Referring Lance Galas: Treating Shlonda Dolloff/Extender: [name] in Treatment: 32 Visit Information History Since Last Visit Added or deleted any medications: No Patient Arrived: Wheel Chair Any new allergies or adverse reactions: No Arrival Time: 14:35 Had a fall or experienced change in Yes Transfer Assistance: Manual activities of daily living that may affect Patient Identification Verified: Yes risk of falls: Secondary Verification Process Completed: Yes Signs or symptoms of abuse/neglect since last visito No Patient Requires Transmission-Based Precautions: No Hospitalized since last visit: No Patient Has Alerts: No Implantable device outside of the clinic excluding No cellular tissue based products placed in the center since last visit: Has Dressing in Place as Prescribed: Yes Has Compression in Place as Prescribed: Yes Pain Present Now: Yes Electronic Signature(s) Signed: 12/28/2020 5:35:17 PM By: Lorrin Jackson Entered By: Lorrin Jackson on 12/28/2020 14:37:46 -------------------------------------------------------------------------------- Compression Therapy Details [name], [name] 12/28/2020 2:00 PM Medical Record Number: [id number] Patient Account Number: [id number] Date of Birth/Sex: Treating RN: 11/27/1955 (65 y.o. [name] Primary Care Tracina Beaumont: [name] Other Clinician: Referring Kenniyah Sasaki: Treating Stephanny Tsutsui/Extender: [name] in Treatment: 43 Compression Therapy Performed for Wound Assessment: Wound #21 Left,Distal Lower  Leg Performed By: Clinician [name], RN Compression Type: Four Layer Post Procedure Diagnosis Same as Pre-procedure Electronic Signature(s) Signed: 12/29/2020 4:53:51 PM By: [name] Entered By: [name] on 12/28/2020 15:41:30 -------------------------------------------------------------------------------- Compression Therapy Details [name], [name] 12/28/2020 2:00 PM Medical Record Number: [id number] Patient Account Number: [id number] Date of Birth/Sex: Treating RN: 1956/05/30 (65 y.o. [name] Primary Care Donnielle Addison: [name] Other Clinician: Referring Britteny Fiebelkorn: Treating Izza Bickle/Extender: [name] in Treatment: 42 Compression Therapy Performed for Wound Assessment: Wound #22 Right,Medial Lower Leg Performed By: Clinician [name], RN Compression Type: Four Layer Post Procedure Diagnosis Same as Pre-procedure Electronic Signature(s) Signed: 12/29/2020 4:53:51 PM By: [name] Entered By: [name] on 12/28/2020 15:41:30 -------------------------------------------------------------------------------- Encounter Discharge Information Details [name], [name] 12/28/2020 2:00 PM Medical Record Number: [id number] Patient Account Number: [id number] Date of Birth/Sex: Treating RN: [birth year] (65 y.o. [name] Primary Care Rukiya Hodgkins: [name] Other Clinician: Referring Nicholus Chandran: Treating Albertia Carvin/Extender: [name] in Treatment: 56 Encounter Discharge Information Items Discharge Condition: Stable Ambulatory Status: Walker Discharge Destination: [place] Telephoned: No Orders Sent: Yes Transportation: Other Accompanied By: self Schedule Follow-up Appointment: Yes Clinical Summary of Care: Patient Declined Notes facility transportation Electronic Signature(s) Signed: 12/28/2020 5:18:58 PM  By: [name] RN, BSN Entered By: [name] on 12/28/2020 17:18:01 -------------------------------------------------------------------------------- Lower Extremity Assessment Details [name], [name] 12/28/2020 2:00 PM Medical Record Number: [id number] Patient Account Number: [id number] Date of Birth/Sex: Treating RN: 02-Sep-1956 (65 y.o. [name] Primary Care Murl Golladay: [name] Other Clinician: Referring Jake Goodson: Treating Olie Dibert/Extender: [name] in Treatment: 43 Edema Assessment Assessed: Shirlyn Goltz: No] Patrice Paradise: No] Edema: [Left: Yes] [Right: Yes] Calf Left: Right: Point of Measurement: 52 cm From Medial Instep 40.5 cm 39.8 cm Ankle Left: Right: Point of Measurement: 10 cm From Medial Instep 26 cm 26 cm Vascular Assessment Pulses: Dorsalis  Pedis Palpable: [Left:No] [Right:Yes] Electronic Signature(s) Signed: 12/28/2020 5:18:58 PM By: [name] RN, BSN Entered By: [name] on 12/28/2020 15:17:38 -------------------------------------------------------------------------------- Multi Wound Chart Details [name], [name] 12/28/2020 2:00 PM Medical Record Number: [id number] Patient Account Number: [id number] Date of Birth/Sex: Treating RN: 11/25/1955 (65 y.o. [name] Primary Care Ajit Errico: [name] Other Clinician: Referring Maanvi Lecompte: Treating Jame Morrell/Extender: [name] in Treatment: 73 Vital Signs Height(in): 77 Capillary Blood Glucose(mg/dl): 320 Weight(lbs): 254 Pulse(bpm): 47 Body Mass Index(BMI): 30 Blood Pressure(mmHg): 126/77 Temperature(F): 98.0 Respiratory Rate(breaths/min): 18 Photos: [21:No Photos Left, Distal Lower Leg] [22:No Photos Right, Medial Lower Leg] [N/A:N/A N/A] Wound Location: [21:Gradually Appeared] [22:Gradually Appeared] [N/A:N/A] Wounding Event: [21:Venous Leg Ulcer]  [22:Venous Leg Ulcer] [N/A:N/A] Primary Etiology: [21:Anemia, Sleep Apnea, Congestive] [22:Anemia, Sleep Apnea, Congestive] [N/A:N/A] Comorbid History: [21:Heart Failure, Hypertension, Peripheral Heart Failure, Hypertension, Peripheral Venous Disease, Type II Diabetes, Osteoarthritis, Neuropathy 11/22/2019] [22:Venous Disease, Type II Diabetes, Osteoarthritis, Neuropathy 12/23/2019]  [N/A:N/A] Date Acquired: [21:43] [22:43] [N/A:N/A] Weeks of Treatment: [21:Open] [22:Open] [N/A:N/A] Wound Status: [21:17x16.5x0.2] [22:4.4x2.2x0.2] [N/A:N/A] Measurements L x W x D (cm) [42:595.638] [75:6.433] [N/A:N/A] A (cm) : rea [21:44.061] [22:1.521] [N/A:N/A] Volume (cm) : [21:-40.20%] [22:66.40%] [N/A:N/A] % Reduction in Area: [21:53.20%] [22:66.40%] [N/A:N/A] % Reduction in Volume: [21:Full Thickness Without Exposed] [22:Full Thickness Without Exposed] [N/A:N/A] Classification: [21:Support Structures Large] [22:Support Structures Medium] [N/A:N/A] Exudate Amount: [21:Serosanguineous] [22:Serosanguineous] [N/A:N/A] Exudate Type: [21:red, brown] [22:red, brown] [N/A:N/A] Exudate Color: [21:Distinct, outline attached] [22:Flat and Intact] [N/A:N/A] Wound Margin: [21:Large (67-100%)] [22:Large (67-100%)] [N/A:N/A] Granulation Amount: [21:Pink] [22:Red, Pink] [N/A:N/A] Granulation Quality: [21:Small (1-33%)] [22:Small (1-33%)] [N/A:N/A] Necrotic Amount: [21:Fat Layer (Subcutaneous Tissue): Yes Fat Layer (Subcutaneous Tissue): Yes N/A] Exposed Structures: [21:Fascia: No Tendon: No Muscle: No Joint: No Bone: No Small (1-33%)] [22:Fascia: No Tendon: No Muscle: No Joint: No Bone: No Small (1-33%)] [N/A:N/A] Epithelialization: [21:Compression Therapy] [22:Compression Therapy] [N/A:N/A] Treatment Notes Electronic Signature(s) Signed: 12/28/2020 5:22:04 PM By: Linton Ham MD Signed: 12/29/2020 4:53:51 PM By: [name] Entered By: Linton Ham on 12/28/2020  16:10:43 -------------------------------------------------------------------------------- Multi-Disciplinary Care Plan Details [name], [name] 12/28/2020 2:00 PM Medical Record Number: [id number] Patient Account Number: [id number] Date of Birth/Sex: Treating RN: [birth year] (65 y.o. [name] Primary Care Latia Mataya: [name] Other Clinician: Referring Isabelle Matt: Treating Zavion Sleight/Extender: [name] in Treatment: [place] reviewed with physician Active Inactive Electronic Signature(s) Signed: 04/10/2021 1:14:08 PM By: [name] Previous Signature: 12/29/2020 4:53:51 PM Version By: [name] Entered By: [name] on 04/10/2021 13:14:07 -------------------------------------------------------------------------------- Pain Assessment Details [name], [name] 12/28/2020 2:00 PM Medical Record Number: [id number] Patient Account Number: [id number] Date of Birth/Sex: Treating RN: 1956-07-24 (65 y.o. [name] Primary Care Adeeb Konecny: [name] Other Clinician: Referring Marta Bouie: Treating Noam Karaffa/Extender: [name] in Treatment: 85 Active Problems Location of Pain Severity and Description of Pain Patient Has Paino Yes Site Locations Pain Location: Pain Location: Pain in Ulcers With Dressing Change: Yes Duration of the Pain. Constant / Intermittento Intermittent Rate the pain. Current Pain Level: 7 Worst Pain Level: 10 Character of Pain Describe the Pain: Burning, Tender, Throbbing Pain Management and Medication Current Pain Management: Medication: Yes Cold Application: No Rest: Yes Massage: No Activity: No T.E.N.S.: No Heat Application: No Leg drop or elevation: No Is the Current Pain Management Adequate: Inadequate How does your wound impact your activities of daily livingo Sleep: Yes Bathing:  No  Appetite: No Relationship With Others: No Bladder Continence: No Emotions: No Bowel Continence: No Work: No Toileting: No Drive: No Dressing: No Hobbies: No Electronic Signature(s) Signed: 12/28/2020 5:35:17 PM By: Lorrin Jackson Entered By: Lorrin Jackson on 12/28/2020 14:47:52 -------------------------------------------------------------------------------- Patient/Caregiver Education Details [name], [name] 4/7/2022andnbsp2:00 PM Medical Record Number: [id number] Patient Account Number: [id number] Date of Birth/Gender: Treating RN: 05-12-1956 (65 y.o. [name] Primary Care Physician: [name] Other Clinician: Referring Physician: Treating Physician/Extender: [name] in Treatment: 59 Education Assessment Education Provided To: Patient Education Topics Provided Wound/Skin Impairment: Handouts: Skin Care Do's and Dont's Methods: Explain/Verbal Responses: Reinforcements needed Electronic Signature(s) Signed: 12/29/2020 4:53:51 PM By: [name] Signed: 12/29/2020 4:53:51 PM By: [name] Entered By: [name] on 12/28/2020 14:59:50 -------------------------------------------------------------------------------- Wound Assessment Details [name], [name] 12/28/2020 2:00 PM Medical Record Number: [id number] Patient Account Number: [id number] Date of Birth/Sex: Treating RN: Jan 19, 1956 (65 y.o. [name] Primary Care Arlo Butt: [name] Other Clinician: Referring Reda Citron: Treating Tanara Turvey/Extender: [name] in Treatment: 43 Wound Status Wound Number: 21 Primary Venous Leg Ulcer Etiology: Wound Location: Left, Distal Lower Leg Wound Open Wounding Event: Gradually Appeared Status: Date Acquired: 11/22/2019 Comorbid Anemia, Sleep Apnea, Congestive Heart Failure, Hypertension, Weeks Of Treatment: 43 History:  Peripheral Venous Disease, Type II Diabetes, Osteoarthritis, Clustered Wound: No Neuropathy Photos Wound Measurements Length: (cm) 17 Width: (cm) 16.5 Depth: (cm) 0.2 Area: (cm) 220.304 Volume: (cm) 44.061 % Reduction in Area: -40.2% % Reduction in Volume: 53.2% Epithelialization: Small (1-33%) Tunneling: No Undermining: No Wound Description Classification: Full Thickness Without Exposed Support Structures Wound Margin: Distinct, outline attached Exudate Amount: Large Exudate Type: Serosanguineous Exudate Color: red, brown Foul Odor After Cleansing: No Slough/Fibrino Yes Wound Bed Granulation Amount: Large (67-100%) Exposed Structure Granulation Quality: Pink Fascia Exposed: No Necrotic Amount: Small (1-33%) Fat Layer (Subcutaneous Tissue) Exposed: Yes Necrotic Quality: Adherent Slough Tendon Exposed: No Muscle Exposed: No Joint Exposed: No Bone Exposed: No Electronic Signature(s) Signed: 12/28/2020 4:58:23 PM By: Sandre Kitty Signed: 12/29/2020 4:53:51 PM By: [name] Entered By: Sandre Kitty on 12/28/2020 16:45:50 -------------------------------------------------------------------------------- Wound Assessment Details [name], [name] 12/28/2020 2:00 PM Medical Record Number: [id number] Patient Account Number: [id number] Date of Birth/Sex: Treating RN: 11-16-1955 (65 y.o. [name] Primary Care Lahela Woodin: [name] Other Clinician: Referring Michaell Grider: Treating Dagan Heinz/Extender: [name] in Treatment: 43 Wound Status Wound Number: 22 Primary Venous Leg Ulcer Etiology: Wound Location: Right, Medial Lower Leg Wound Open Wounding Event: Gradually Appeared Status: Date Acquired: 12/23/2019 Comorbid Anemia, Sleep Apnea, Congestive Heart Failure, Hypertension, Weeks Of Treatment: 43 History: Peripheral Venous Disease, Type II Diabetes, Osteoarthritis, Clustered Wound: No  Neuropathy Photos Wound Measurements Length: (cm) 4.4 Width: (cm) 2.2 Depth: (cm) 0.2 Area: (cm) 7.603 Volume: (cm) 1.521 % Reduction in Area: 66.4% % Reduction in Volume: 66.4% Epithelialization: Small (1-33%) Tunneling: No Undermining: No Wound Description Classification: Full Thickness Without Exposed Support Structures Wound Margin: Flat and Intact Exudate Amount: Medium Exudate Type: Serosanguineous Exudate Color: red, brown Foul Odor After Cleansing: No Slough/Fibrino Yes Wound Bed Granulation Amount: Large (67-100%) Exposed Structure Granulation Quality: Red, Pink Fascia Exposed: No Necrotic Amount: Small (1-33%) Fat Layer (Subcutaneous Tissue) Exposed: Yes Necrotic Quality: Adherent Slough Tendon Exposed: No Muscle Exposed: No Joint Exposed: No Bone Exposed: No Electronic Signature(s) Signed: 12/28/2020 4:58:23 PM By: Sandre Kitty Signed: 12/29/2020 4:53:51 PM By: [name]  Entered By: Sandre Kitty on 12/28/2020 16:45:26 -------------------------------------------------------------------------------- Vitals Details [name], [name] 12/28/2020 2:00 PM Medical Record Number: [id number] Patient Account Number: [id number] Date of Birth/Sex: Treating RN: May 27, 1956 (65 y.o. [name] Primary Care Koda Defrank: [name] Other Clinician: Referring Katelynn Heidler: Treating Miosotis Wetsel/Extender: [name] in Treatment: 30 Vital Signs Time Taken: 14:37 Temperature (F): 98.0 Height (in): 77 Pulse (bpm): 77 Weight (lbs): 254 Respiratory Rate (breaths/min): 18 Body Mass Index (BMI): 30.1 Blood Pressure (mmHg): 126/77 Capillary Blood Glucose (mg/dl): 320 Reference Range: 80 - 120 mg / dl Electronic Signature(s) Signed: 12/28/2020 5:35:17 PM By: Lorrin Jackson Entered By: Lorrin Jackson on 12/28/2020 14:38:34

## 2020-12-29 NOTE — Progress Notes (Signed)
[name] ([id number]) Visit Report for 12/28/2020 HPI Details Patient Name: Date of Service: [name] 12/28/2020 2:00 PM Medical Record Number: [id number] Patient Account Number: [id number] Date of Birth/Sex: Treating RN: March 06, 1956 (65 y.o. [name] Primary Care Provider: [name] Other Clinician: Referring Provider: Treating Provider/Extender: [name] in Treatment: 70 History of Present Illness HPI Description: 07/07/15; this is a patient who is in hospital on 8/2 through 8/4. He had cellulitis and abscess of predominantly I think the left leg. He received IV antibiotics. Plain x-ray showed no osteomyelitis. An MRI of the left leg did not show osteomyelitis. Cultures showed no predominant organism. His hemoglobin A1c was 9.8. He has a history of venous stasis also peripheral vascular disease. He was discharged to [facility] [place] home. He really has extensive ulcerations on the left lateral leg including a major wound that communicates both posteriorly and superiorly w. When drainage coming out of 2 smaller areas. He has a smaller wound on the posterior medial left leg. He has more predominantly venous insufficiency wounds on predominantly the right medial leg above the ankle the right foot into sections. He has recently been put on Augmentin at the nursing home. Previous ABIs/arterial evaluation showed triphasic waves diffusely he does not have a major ischemic issue a left lower extremity venous duplex also exam showed no evidence of a DVT on 6/21 07/21/15; the patient arrives with really no major change. Culture I did last time was negative. He has not had a follow-up MRI I ordered. The wounds are macerated covered with a thick gelatinous surface slough. There is necrotic subcutaneous tissue 08/04/15; the patient arrives with a considerable improvement in the majority of his wound area. The area on the left leg now has what looks to be  a granulated base. Most of the wounds on the left leg required an aggressive surgical debridement to remove nonviable fibrinous eschar and subcutaneous tissue however after debridement most of this looks better. Although I had asked for repeat MRI of the left leg when he first came in here with grossly purulent material coming out of his wounds, it does not appear that this is been done and nor do I actually feel that strongly about it right now. He has severe surrounding venous insufficiency and inflammation. I don't believe he has significant PAD 08/25/15. In general there is still a considerable wound area here but with still extensive service adherent slough. The patient will not allow mechanical debridement due to pain. He did not tolerate Medihoney therefore we are left with Santyl for now. She would appear that he has severe surrounding venous stasis. There is no evidence of the infection that may have had something to do with the pathogenesis of these wounds 09/29/15; patient still has substantial wound area on the left leg with a cluster of several wounds on the lateral leg confluently on the left leg posteriorly and then a substantial wound on the medial leg. On the right leg a substantial wound medially and a small area on the right lateral foot. All of these underwent a substantial surgical debridement with curettes which she tolerated better than he has in the past. This started as a complex cellulitis in the face of chronic venous insufficiency and inflammation. 10/13/15; substantial cluster of wounds on the lateral aspect of his left leg confluently around to the other side. Extensive surgical debridement to remove for redness surface slough nonviable subcutaneous tissue. This is not an improvement. Also  substantial wound on the medial right leg which is largely unchanged. The etiology of this was felt to be a complex cellulitis in the summer of 2016 in the face of chronic venous  insufficiency and inflammation. The patient states that the wound on the right medial leg has been there for years off and on. 10/20/15; we are able to start [facility] to these extensive wound areas left greater than right on Tuesday after I spoke to the wound care nurse at the facility. He arrives here today for extensive surgical debridement for the wounds on the lateral aspect of his left leg posterior left leg, this is almost circumferential. He has a large wound on the medial aspect of his right leg although he finds this too painful for debridement 10/27/15; I continue to bring this patient back for frequent debridement/weekly debridement severe. We have been using Hydrofera Blue. Unfortunately this has not really had any improvement. The debridement surgery difficult and painful for the patient 11/23/15; this patient spent a complex hospitalization admitted with acute kidney injury, anemia, cellulitis of the lower extremity, he was felt to have sepsis pathophysiology although his blood cultures were negative. He had plain x-rays of both legs that were negative for osseous abnormalities. He was seen by infectious disease and placed on a workup for vasculitis that was negative including a biopsy 4 all were consistent with stasis dermatitis. Vital broad- spectrum antibiotics he continued to spike fevers. Urine and chest x-ray were negative Dopplers on admission ruled out a DVT All antibiotics were stopped on . 2/17. Since his return to [facility] skilled nursing facility I believe they have been using Xeroform 12/07/15 the patient arrives today with the area on his right medial leg actually looking quite stable. No debridement. The rest of his extensive wounds on the left lateral left posterior extending into the left medial leg all required extensive debridement. I think this is probably going to need to and up in the hands of plastic surgery. We'll attempt to change him back to [facility].  Arrange consultation with plastic surgery at [facility] for this almost circumferential wound on the left side 12/21/15 right leg covered in surface slough. This was debridement. Left leg extensive wounds all carefully examined. This is almost circumferential on the left side especially on the posterior calf. No debridement is necessary. 01/04/16 the patient has been to [facility] plastic surgery. Unfortunately it doesn't look like they had any of the prior workup on this patient. They're in the middle of vascular workup. It sounds as though they're applying Hydrofera Blue at the nursing home. 01/22/16; the patient has been back to see [facility]. There apparently making plans to possibly do skin grafts over his large venous insufficiency ulcerations. The patient tells me that he goes to hematology in [facility] and variably uses the term platelets red cells and the description of his problem. 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 I do not see any major difference in the wound on the right medial leg and circumferentially across the left medial to left lateral leg. Did not attempt to debride these today. Readmission: 05/05/18 on evaluation today patient presents for reevaluation here in our clinic although I have previously seen him in the skilled nursing facility over the past year and a half when I was working in the skilled nursing facility realm instead of covering in the clinic. With that being said during that time we had initiated most  recently dressings with Hydrofera Blue Dressing along with the [employer] which had done very well for him. Much better than the Kerlex Coban wraps. With that being said the patient had been doing fairly well and was making progress when I last saw him. Upon evaluation today it appears that the wounds are actually a little bit worse than when I last saw him although definitely not dramatically so. There does not appear  to be any evidence of infection which is good news. With that being said he has been tolerating the wraps without complication his left hip still gives him a lot of trouble which is his main issue as far as movement is concerned. Unbeknownst to me he has not had venous studies that I can find. He previously told me he had there were arterial studies noted back from 04/27/15 which showed that he had try phasic blood flow throughout and again his test appear to be completely normal as performed by Dr. Creig Hines. With that being said I cannot find where he had venous studies. The patient still states he thinks he did these definitely had no venous intervention at this point. Upon evaluation today it does not appear to me that the patient has any evidence of cellulitis. He does have some chronic venous insufficiency which I think has led to stasis dermatitis but again this is not appearing to be infected at this point. No fevers, chills, nausea, or vomiting noted at this time. READMISSION 06/30/2018 This is a now 65 year old man we have had in this clinic at multiple times in the past. Most recently he came in August and was seen by [facility] stone. It is not clear why he did not come back we asked him really did not get a straight answer. He is at [facility] skilled facility he is a man who has severe chronic venous insufficiency with lymphedema and has had severe wounds on his left greater than right leg. We had him here in 2016 and 17 ultimately referred him to [facility]. He had arterial studies and venous studies done at [facility] although I am not able to access these records I see they are actually done. He also had multiple biopsies that were negative for malignancy showing changes compatible with chronic inflammation. As I understand things in [place] they are using Iodoflex and Unna boots. I am not really sure how they are getting enough Iodoflex for the large wound area especially on his left  calf. Nevertheless the wounds look better than when I saw this in 2017. There were plans for him to have plastic surgery in 2018 and I think he was actually prepped for surgery however he was ultimately denied because the patient was an active smoker. The patient is not systemically unwell. The wounds are painful however given the size especially on the left this is not surprising. ABIs in this clinic were 0.78 on the left and 0.91 on the right 07/13/2018; this is a patient with bilateral severe venous ulcers with secondary lymphedema. The wounds are on the right medial calf and a substantial part of the left posterior calf and some involvement medially and laterally on the left. We changed him to silver alginate under Unna boot's last time. The wounds actually look fairly satisfactory today. 08/14/2018; this is a patient with severe bilateral venous stasis ulcers with secondary lymphedema left greater than right he is at [facility] using silver alginate under Unna boot's I do not think there is  much change on this visit versus last time I saw him a month ago Readmission: 11/04/18 patient presents today for follow-up concerning his bilateral lower extremity ulcers. His a right medial ankle ulcer and a left leg that has ulcers over a large proportion of the surface area between the ankle and knee. Unfortunately this does cause some discomfort for the patient although it doesn't seem to be as uncomfortable as it has been in the past. He is seen today for evaluation after a referral by [place] back to the clinic. Unfortunately has a lot of Slough noted on the surface of his wounds. He's been treated currently it sounds like with Dakin's soaked gauze and they have not been performing the Unna Boot wrap that was previously recommended. I'm not exactly sure what the reason for the change was. He does have less slough on the surface of the wound but again this is something that is often a constant issue for  him. Again he's had these wounds for many years. I've known him for two years at least during that time when I was taking care of them in the facility he is Artie had the wounds for several years prior. READMISSION 02/25/2020 [name] is a now 65 year old man. He has been in our clinic several times before most extensively in October 2016 through May 2017. At that time he had absolutely substantial bilateral lower extremity wounds secondary to chronic venous insufficiency and lymphedema. We ultimately referred him to [facility] he had a series of biopsies only showed suggestion of wound secondary to chronic venous insufficiency. He had a less extensive stay in our clinic in 2019 and then a single visit in February 2020. He is a resident at [facility] skilled facility. I think he has had intermittently had in facility wound care. He returns today. They are using silver alginate on the wounds although I am not sure what type of compression. Previously he has favored Unna boots. He is not felt to have an arterial issue. Past medical history; lymphedema and chronic venous insufficiency, diabetes mellitus, hypertension, congestive heart failure We did not do arterial studies today 04/27/2020 on evaluation patient appears to be doing really about the same as when I have known him and seen him in the past. He does have wounds on the right and left legs at this point. Fortunately there is no signs of active infection at this time. 9/2; 1 month follow-up. This is a man with severe chronic venous insufficiency and lymphedema. When I first met him he had horrible circumferential bilateral lower extremity ulcers. We have been using silver alginate up until early last month when it was changed to [facility] and Unna boot's more or less says maintenance dressings. Since the last time he was here the facility where he resides [facility] called to report they could no longer do Unna boot so he is apparently only  been receiving kerlix Coban the left leg is certainly a lot worse with almost circumferential large wounds especially lateral but now medial and posterior as well. He has a standard same looking wound on the right medial lower leg. 10/1; absolutely no improvement here. In fact I do not think anything much is changed. The substantial area on his left lateral and left posterior calf is still open may be slightly larger. He has a more modest wound on the right medial calf. None of these look any different. He comes in with a kerlix Coban wrap this will simply not  be adequate. He has too much edema in this leg He also is complaining a lot of pain in his left heel area. 11/8; potential wounds on the left lateral and posterior calf and a smaller oval wound in the right medial. We have been using Hydrofera Blue under 4-layer compression. He was using Unna boots still 2 months ago the facility called to say they can no longer place these so we have been using 4-layer compression. The surface area of the area on the left is so large there is very few alternatives to what we can use on this either silver alginate or Hydrofera Blue. He was complaining of pain on the left heel last time I asked for an x-ray this was apparently done although we do not have the result. He is not complaining of pain today. 08/16/2020 on evaluation today patient is is seen for a early visit due to the fact that he apparently is having issues I was told when they called on Monday with a MRSA infection that they felt like we needed to see him for because his legs "looked horrible". With that being said based on what I see at this point it does not appear that the patient is actually having a terrible infection in fact compared to last time I saw him his legs do not appear to be doing too poorly at all at this time. Nonetheless he has been on doxycycline that is not going to be a good option for him based on the culture report that I  have for review today which graded as a partial report with still several organisms pending as far as identification is concerned we did contact them but again they do not have the final report yet. Either way based on what we see it appears that doxycycline is one of the few medications that will not work for the Citrobacter. Obviously I think that a different medication would be a good option for him since there is a gram-negative organism pending I am likely can I suggest Cipro as the best of backup antibiotic to switch him to at this point. All this was discussed with the patient today. 12/20; not much change in the substantial wound on the left posterior calf and the small area on the right medial. He has a new area on the left anterior which looks like a wrap injury. Had some thoughts about doing a deep tissue culture here although we will put that off for next time. Using silver alginate as a primary dressing 2/10; substantial area on the left posterior calf. This measures smaller but still is a very large area. He has the area on the right anterior medial which also is a fairly large wound but much smaller than not on the left. His edema control is quite good. We have been using silver alginate because of the size of the wounds. 4/7; substantial wound on the left posterior calf and a smaller area on the right medial lower leg. These are very chronic wounds which we have classified is venous. Previously worked up at [facility] for 5 years ago at which time I had sent and will send him over for consideration of extensive skin graft I know they biopsied this many times but he never had plastic surgery. The wound area is much too large for standard size dressings we have been using silver alginate but we did give him a good trial last fall of Hydrofera Blue that did not make any difference  either. If anything the area on the right medial lower leg over time has gotten a lot smaller as has the area on  the left although it still quite substantial now. Electronic Signature(s) Signed: 12/28/2020 5:22:04 PM By: Linton Ham MD Entered By: Linton Ham on 12/28/2020 16:11:55 -------------------------------------------------------------------------------- Physical Exam Details Patient Name: Date of Service: [name] 12/28/2020 2:00 PM Medical Record Number: [id number] Patient Account Number: [id number] Date of Birth/Sex: Treating RN: September 05, 1956 (65 y.o. [name] Primary Care Provider: [name] Other Clinician: Referring Provider: Treating Provider/Extender: [name] in Treatment: 52 Constitutional Sitting or standing Blood Pressure is within target range for patient.. Pulse regular and within target range for patient.Marland Kitchen Respirations regular, non-labored and within target range.. Temperature is normal and within the target range for the patient.Marland Kitchen Appears in no distress. Notes Wound exam; no evidence of infection his edema control is actually quite good peripheral pulses are easily palpable. Substantial area on the left posterior and lateral calf probably not much changed on the right medial I think this is probably progressively smaller gradually over the months. No major change from last time. He seems to have decent edema control bilaterally skin changes of chronic venous insufficiency Electronic Signature(s) Signed: 12/28/2020 5:22:04 PM By: Linton Ham MD Entered By: Linton Ham on 12/28/2020 16:12:49 -------------------------------------------------------------------------------- Physician Orders Details Patient Name: Date of Service: [name] 12/28/2020 2:00 PM Medical Record Number: [id number] Patient Account Number: [id number] Date of Birth/Sex: Treating RN: [birth year] (65 y.o. [name] Primary Care Provider: [name] Other Clinician: Referring Provider: Treating Provider/Extender: [name] in Treatment: 26 Verbal / Phone Orders: No Diagnosis Coding ICD-10 Coding Code Description [phone] Chronic venous hypertension (idiopathic) with ulcer and inflammation of bilateral lower extremity I89.0 Lymphedema, not elsewhere classified L97.222 Non-pressure chronic ulcer of left calf with fat layer exposed L97.212 Non-pressure chronic ulcer of right calf with fat layer exposed Follow-up Appointments ppointment in: - 2 months Return A Bathing/ Shower/ Hygiene May shower with protection but do not get wound dressing(s) wet. Edema Control - Lymphedema / SCD / Other Elevate legs to the level of the heart or above for 30 minutes daily and/or when sitting, a frequency of: - throughout the day Avoid standing for long periods of time. Wound Treatment Wound #21 - Lower Leg Wound Laterality: Left, Distal Cleanser: Soap and Water 3 x Per Week/30 Days Discharge Instructions: May shower and wash wound with dial antibacterial soap and water prior to dressing change. Cleanser: Wound Cleanser 3 x Per Week/30 Days Discharge Instructions: Cleanse the wound with wound cleanser prior to applying a clean dressing using gauze sponges, not tissue or cotton balls. Peri-Wound Care: Zinc Oxide Ointment 30g tube 3 x Per Week/30 Days Discharge Instructions: Apply Zinc Oxide to periwound with each dressing change Peri-Wound Care: Sween Lotion (Moisturizing lotion) 3 x Per Week/30 Days Discharge Instructions: Apply moisturizing lotion as directed Prim Dressing: KerraCel Ag Gelling Fiber Dressing, 4x5 in (silver alginate) 3 x Per Week/30 Days ary Discharge Instructions: Apply silver alginate to wound bed as instructed Secondary Dressing: Woven Gauze Sponge, Non-Sterile 4x4 in 3 x Per Week/30 Days Discharge Instructions: Apply over primary dressing as directed. Secondary Dressing: ABD Pad, 8x10 3 x Per Week/30 Days Discharge Instructions: Apply over primary dressing as  directed. Compression Wrap: FourPress (4 layer compression wrap) 3 x Per Week/30 Days Discharge Instructions: Apply four layer compression as directed. Wound #22 - Lower Leg Wound Laterality: Right,  Medial Cleanser: Soap and Water 3 x Per Week/30 Days Discharge Instructions: May shower and wash wound with dial antibacterial soap and water prior to dressing change. Cleanser: Wound Cleanser 3 x Per Week/30 Days Discharge Instructions: Cleanse the wound with wound cleanser prior to applying a clean dressing using gauze sponges, not tissue or cotton balls. Peri-Wound Care: Zinc Oxide Ointment 30g tube 3 x Per Week/30 Days Discharge Instructions: Apply Zinc Oxide to periwound with each dressing change Peri-Wound Care: Sween Lotion (Moisturizing lotion) 3 x Per Week/30 Days Discharge Instructions: Apply moisturizing lotion as directed Prim Dressing: KerraCel Ag Gelling Fiber Dressing, 4x5 in (silver alginate) 3 x Per Week/30 Days ary Discharge Instructions: Apply silver alginate to wound bed as instructed Secondary Dressing: Woven Gauze Sponge, Non-Sterile 4x4 in 3 x Per Week/30 Days Discharge Instructions: Apply over primary dressing as directed. Secondary Dressing: ABD Pad, 8x10 3 x Per Week/30 Days Discharge Instructions: Apply over primary dressing as directed. Compression Wrap: FourPress (4 layer compression wrap) 3 x Per Week/30 Days Discharge Instructions: Apply four layer compression as directed. Electronic Signature(s) Signed: 12/28/2020 5:22:04 PM By: Linton Ham MD Signed: 12/29/2020 4:53:51 PM By: [name] Entered By: [name] on 12/28/2020 15:44:18 -------------------------------------------------------------------------------- Problem List Details Patient Name: Date of Service: [name] 12/28/2020 2:00 PM Medical Record Number: [id number] Patient Account Number: [id number] Date of Birth/Sex: Treating RN: 1956/06/25 (65 y.o. [name] Primary Care Provider:  [name] Other Clinician: Referring Provider: Treating Provider/Extender: [name] in Treatment: 85 Active Problems ICD-10 Encounter Code Description Active Date MDM Diagnosis I87.333 Chronic venous hypertension (idiopathic) with ulcer and inflammation of 02/25/2020 No Yes bilateral lower extremity I89.0 Lymphedema, not elsewhere classified 02/25/2020 No Yes L97.222 Non-pressure chronic ulcer of left calf with fat layer exposed 02/25/2020 No Yes L97.212 Non-pressure chronic ulcer of right calf with fat layer exposed 02/25/2020 No Yes Inactive Problems Resolved Problems Electronic Signature(s) Signed: 12/28/2020 5:22:04 PM By: Linton Ham MD Entered By: Linton Ham on 12/28/2020 16:10:37 -------------------------------------------------------------------------------- Progress Note Details Patient Name: Date of Service: [name] 12/28/2020 2:00 PM Medical Record Number: [id number] Patient Account Number: [id number] Date of Birth/Sex: Treating RN: [birth year] (65 y.o. [name] Primary Care Provider: [name] Other Clinician: Referring Provider: Treating Provider/Extender: [name] in Treatment: 57 Subjective History of Present Illness (HPI) 07/07/15; this is a patient who is in hospital on 8/2 through 8/4. He had cellulitis and abscess of predominantly I think the left leg. He received IV antibiotics. Plain x-ray showed no osteomyelitis. An MRI of the left leg did not show osteomyelitis. Cultures showed no predominant organism. His hemoglobin A1c was 9.8. He has a history of venous stasis also peripheral vascular disease. He was discharged to [facility]. He really has extensive ulcerations on the left lateral leg including a major wound that communicates both posteriorly and superiorly w. When drainage coming out of 2 smaller areas. He has a smaller wound on the posterior medial left  leg. He has more predominantly venous insufficiency wounds on predominantly the right medial leg above the ankle the right foot into sections. He has recently been put on Augmentin at the nursing home. Previous ABIs/arterial evaluation showed triphasic waves diffusely he does not have a major ischemic issue a left lower extremity venous duplex also exam showed no evidence of a DVT on 6/21 07/21/15; the patient arrives with really no major change. Culture I did last time was negative. He  has not had a follow-up MRI I ordered. The wounds are macerated covered with a thick gelatinous surface slough. There is necrotic subcutaneous tissue 08/04/15; the patient arrives with a considerable improvement in the majority of his wound area. The area on the left leg now has what looks to be a granulated base. Most of the wounds on the left leg required an aggressive surgical debridement to remove nonviable fibrinous eschar and subcutaneous tissue however after debridement most of this looks better. Although I had asked for repeat MRI of the left leg when he first came in here with grossly purulent material coming out of his wounds, it does not appear that this is been done and nor do I actually feel that strongly about it right now. He has severe surrounding venous insufficiency and inflammation. I don't believe he has significant PAD 08/25/15. In general there is still a considerable wound area here but with still extensive service adherent slough. The patient will not allow mechanical debridement due to pain. He did not tolerate Medihoney therefore we are left with Santyl for now. She would appear that he has severe surrounding venous stasis. There is no evidence of the infection that may have had something to do with the pathogenesis of these wounds 09/29/15; patient still has substantial wound area on the left leg with a cluster of several wounds on the lateral leg confluently on the left leg posteriorly and  then a substantial wound on the medial leg. On the right leg a substantial wound medially and a small area on the right lateral foot. All of these underwent a substantial surgical debridement with curettes which she tolerated better than he has in the past. This started as a complex cellulitis in the face of chronic venous insufficiency and inflammation. 10/13/15; substantial cluster of wounds on the lateral aspect of his left leg confluently around to the other side. Extensive surgical debridement to remove for redness surface slough nonviable subcutaneous tissue. This is not an improvement. Also substantial wound on the medial right leg which is largely unchanged. The etiology of this was felt to be a complex cellulitis in the summer of 2016 in the face of chronic venous insufficiency and inflammation. The patient states that the wound on the right medial leg has been there for years off and on. 10/20/15; we are able to start [facility] to these extensive wound areas left greater than right on Tuesday after I spoke to the wound care nurse at the facility. He arrives here today for extensive surgical debridement for the wounds on the lateral aspect of his left leg posterior left leg, this is almost circumferential. He has a large wound on the medial aspect of his right leg although he finds this too painful for debridement 10/27/15; I continue to bring this patient back for frequent debridement/weekly debridement severe. We have been using Hydrofera Blue. Unfortunately this has not really had any improvement. The debridement surgery difficult and painful for the patient 11/23/15; this patient spent a complex hospitalization admitted with acute kidney injury, anemia, cellulitis of the lower extremity, he was felt to have sepsis pathophysiology although his blood cultures were negative. He had plain x-rays of both legs that were negative for osseous abnormalities. He was seen by infectious disease and  placed on a workup for vasculitis that was negative including a biopsy o4 all were consistent with stasis dermatitis. Vital broad-spectrum antibiotics he continued to spike fevers. Urine and chest x-ray were negative Dopplers on admission ruled out  a DVT All antibiotics were stopped on 2/17. . Since his return to [facility] skilled nursing facility I believe they have been using Xeroform 12/07/15 the patient arrives today with the area on his right medial leg actually looking quite stable. No debridement. The rest of his extensive wounds on the left lateral left posterior extending into the left medial leg all required extensive debridement. I think this is probably going to need to and up in the hands of plastic surgery. We'll attempt to change him back to [facility]. Arrange consultation with plastic surgery at [facility] for this almost circumferential wound on the left side 12/21/15 right leg covered in surface slough. This was debridement. Left leg extensive wounds all carefully examined. This is almost circumferential on the left side especially on the posterior calf. No debridement is necessary. 01/04/16 the patient has been to [facility] plastic surgery. Unfortunately it doesn't look like they had any of the prior workup on this patient. They're in the middle of vascular workup. It sounds as though they're applying Hydrofera Blue at the nursing home. 01/22/16; the patient has been back to see [facility] There apparently making plans to possibly do skin grafts over his large venous insufficiency ulcerations. The patient tells me that he goes to hematology in [facility] and variably uses the term platelets red cells and the description of his problem. 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 I do not see any major difference in the wound on the right medial leg and circumferentially across the left medial to left lateral leg. Did not attempt to debride  these today. Readmission: 05/05/18 on evaluation today patient presents for reevaluation here in our clinic although I have previously seen him in the skilled nursing facility over the past year and a half when I was working in the skilled nursing facility realm instead of covering in the clinic. With that being said during that time we had initiated most recently dressings with Hydrofera Blue Dressing along with the [employer] which had done very well for him. Much better than the Kerlex Coban wraps. With that being said the patient had been doing fairly well and was making progress when I last saw him. Upon evaluation today it appears that the wounds are actually a little bit worse than when I last saw him although definitely not dramatically so. There does not appear to be any evidence of infection which is good news. With that being said he has been tolerating the wraps without complication his left hip still gives him a lot of trouble which is his main issue as far as movement is concerned. Unbeknownst to me he has not had venous studies that I can find. He previously told me he had there were arterial studies noted back from 04/27/15 which showed that he had try phasic blood flow throughout and again his test appear to be completely normal as performed by Dr. Creig Hines. With that being said I cannot find where he had venous studies. The patient still states he thinks he did these definitely had no venous intervention at this point. Upon evaluation today it does not appear to me that the patient has any evidence of cellulitis. He does have some chronic venous insufficiency which I think has led to stasis dermatitis but again this is not appearing to be infected at this point. No fevers, chills, nausea, or vomiting noted at this time. READMISSION 06/30/2018 This is a now  65 year old man we have had in this clinic at multiple times in the past. Most recently he came in August and was seen by [facility]  stone. It is not clear why he did not come back we asked him really did not get a straight answer. He is at [facility] skilled facility he is a man who has severe chronic venous insufficiency with lymphedema and has had severe wounds on his left greater than right leg. We had him here in 2016 and 17 ultimately referred him to [facility]. He had arterial studies and venous studies done at [facility] although I am not able to access these records I see they are actually done. He also had multiple biopsies that were negative for malignancy showing changes compatible with chronic inflammation. As I understand things in [place] they are using Iodoflex and Unna boots. I am not really sure how they are getting enough Iodoflex for the large wound area especially on his left calf. Nevertheless the wounds look better than when I saw this in 2017. There were plans for him to have plastic surgery in 2018 and I think he was actually prepped for surgery however he was ultimately denied because the patient was an active smoker. The patient is not systemically unwell. The wounds are painful however given the size especially on the left this is not surprising. ABIs in this clinic were 0.78 on the left and 0.91 on the right 07/13/2018; this is a patient with bilateral severe venous ulcers with secondary lymphedema. The wounds are on the right medial calf and a substantial part of the left posterior calf and some involvement medially and laterally on the left. We changed him to silver alginate under Unna boot's last time. The wounds actually look fairly satisfactory today. 08/14/2018; this is a patient with severe bilateral venous stasis ulcers with secondary lymphedema left greater than right he is at [facility] using silver alginate under Unna boot's I do not think there is much change on this visit versus last time I saw him a month ago Readmission: 11/04/18 patient presents today for follow-up concerning  his bilateral lower extremity ulcers. His a right medial ankle ulcer and a left leg that has ulcers over a large proportion of the surface area between the ankle and knee. Unfortunately this does cause some discomfort for the patient although it doesn't seem to be as uncomfortable as it has been in the past. He is seen today for evaluation after a referral by [place] back to the clinic. Unfortunately has a lot of Slough noted on the surface of his wounds. He's been treated currently it sounds like with Dakin's soaked gauze and they have not been performing the Unna Boot wrap that was previously recommended. I'm not exactly sure what the reason for the change was. He does have less slough on the surface of the wound but again this is something that is often a constant issue for him. Again he's had these wounds for many years. I've known him for two years at least during that time when I was taking care of them in the facility he is Artie had the wounds for several years prior. READMISSION 02/25/2020 [name] is a now 65 year old man. He has been in our clinic several times before most extensively in October 2016 through May 2017. At that time he had absolutely substantial bilateral lower extremity wounds secondary to chronic venous insufficiency and lymphedema. We ultimately referred him to [facility] he had a series of  biopsies only showed suggestion of wound secondary to chronic venous insufficiency. He had a less extensive stay in our clinic in 2019 and then a single visit in February 2020. He is a resident at [facility] skilled facility. I think he has had intermittently had in facility wound care. He returns today. They are using silver alginate on the wounds although I am not sure what type of compression. Previously he has favored Unna boots. He is not felt to have an arterial issue. Past medical history; lymphedema and chronic venous insufficiency, diabetes mellitus, hypertension, congestive  heart failure We did not do arterial studies today 04/27/2020 on evaluation patient appears to be doing really about the same as when I have known him and seen him in the past. He does have wounds on the right and left legs at this point. Fortunately there is no signs of active infection at this time. 9/2; 1 month follow-up. This is a man with severe chronic venous insufficiency and lymphedema. When I first met him he had horrible circumferential bilateral lower extremity ulcers. We have been using silver alginate up until early last month when it was changed to [facility] and Unna boot's more or less says maintenance dressings. Since the last time he was here the facility where he resides [facility] called to report they could no longer do Unna boot so he is apparently only been receiving kerlix Coban the left leg is certainly a lot worse with almost circumferential large wounds especially lateral but now medial and posterior as well. He has a standard same looking wound on the right medial lower leg. 10/1; absolutely no improvement here. In fact I do not think anything much is changed. The substantial area on his left lateral and left posterior calf is still open may be slightly larger. He has a more modest wound on the right medial calf. None of these look any different. He comes in with a kerlix Coban wrap this will simply not be adequate. He has too much edema in this leg He also is complaining a lot of pain in his left heel area. 11/8; potential wounds on the left lateral and posterior calf and a smaller oval wound in the right medial. We have been using Hydrofera Blue under 4-layer compression. He was using Unna boots still 2 months ago the facility called to say they can no longer place these so we have been using 4-layer compression. The surface area of the area on the left is so large there is very few alternatives to what we can use on this either silver alginate or Hydrofera Blue. He  was complaining of pain on the left heel last time I asked for an x-ray this was apparently done although we do not have the result. He is not complaining of pain today. 08/16/2020 on evaluation today patient is is seen for a early visit due to the fact that he apparently is having issues I was told when they called on Monday with a MRSA infection that they felt like we needed to see him for because his legs "looked horrible". With that being said based on what I see at this point it does not appear that the patient is actually having a terrible infection in fact compared to last time I saw him his legs do not appear to be doing too poorly at all at this time. Nonetheless he has been on doxycycline that is not going to be a good option for him based on  the culture report that I have for review today which graded as a partial report with still several organisms pending as far as identification is concerned we did contact them but again they do not have the final report yet. Either way based on what we see it appears that doxycycline is one of the few medications that will not work for the Citrobacter. Obviously I think that a different medication would be a good option for him since there is a gram-negative organism pending I am likely can I suggest Cipro as the best of backup antibiotic to switch him to at this point. All this was discussed with the patient today. 12/20; not much change in the substantial wound on the left posterior calf and the small area on the right medial. He has a new area on the left anterior which looks like a wrap injury. Had some thoughts about doing a deep tissue culture here although we will put that off for next time. Using silver alginate as a primary dressing 2/10; substantial area on the left posterior calf. This measures smaller but still is a very large area. He has the area on the right anterior medial which also is a fairly large wound but much smaller than not on  the left. His edema control is quite good. We have been using silver alginate because of the size of the wounds. 4/7; substantial wound on the left posterior calf and a smaller area on the right medial lower leg. These are very chronic wounds which we have classified is venous. Previously worked up at [facility] for 5 years ago at which time I had sent and will send him over for consideration of extensive skin graft I know they biopsied this many times but he never had plastic surgery. The wound area is much too large for standard size dressings we have been using silver alginate but we did give him a good trial last fall of Hydrofera Blue that did not make any difference either. If anything the area on the right medial lower leg over time has gotten a lot smaller as has the area on the left although it still quite substantial now. Objective Constitutional Sitting or standing Blood Pressure is within target range for patient.. Pulse regular and within target range for patient.Marland Kitchen Respirations regular, non-labored and within target range.. Temperature is normal and within the target range for the patient.Marland Kitchen Appears in no distress. Vitals Time Taken: 2:37 PM, Height: 77 in, Weight: 254 lbs, BMI: 30.1, Temperature: 98.0 F, Pulse: 77 bpm, Respiratory Rate: 18 breaths/min, Blood Pressure: 126/77 mmHg, Capillary Blood Glucose: 320 mg/dl. General Notes: Wound exam; no evidence of infection his edema control is actually quite good peripheral pulses are easily palpable. Substantial area on the left posterior and lateral calf probably not much changed on the right medial I think this is probably progressively smaller gradually over the months. No major change from last time. He seems to have decent edema control bilaterally skin changes of chronic venous insufficiency Integumentary (Hair, Skin) Wound #21 status is Open. Original cause of wound was Gradually Appeared. The date acquired was: 11/22/2019. The wound has  been in treatment 43 weeks. The wound is located on the Left,Distal Lower Leg. The wound measures 17cm length x 16.5cm width x 0.2cm depth; 220.304cm^2 area and 44.061cm^3 volume. There is Fat Layer (Subcutaneous Tissue) exposed. There is no tunneling or undermining noted. There is a large amount of serosanguineous drainage noted. The wound margin is distinct with the outline  attached to the wound base. There is large (67-100%) pink granulation within the wound bed. There is a small (1-33%) amount of necrotic tissue within the wound bed including Adherent Slough. Wound #22 status is Open. Original cause of wound was Gradually Appeared. The date acquired was: 12/23/2019. The wound has been in treatment 43 weeks. The wound is located on the Right,Medial Lower Leg. The wound measures 4.4cm length x 2.2cm width x 0.2cm depth; 7.603cm^2 area and 1.521cm^3 volume. There is Fat Layer (Subcutaneous Tissue) exposed. There is no tunneling or undermining noted. There is a medium amount of serosanguineous drainage noted. The wound margin is flat and intact. There is large (67-100%) red, pink granulation within the wound bed. There is a small (1-33%) amount of necrotic tissue within the wound bed including Adherent Slough. Assessment Active Problems ICD-10 Chronic venous hypertension (idiopathic) with ulcer and inflammation of bilateral lower extremity Lymphedema, not elsewhere classified Non-pressure chronic ulcer of left calf with fat layer exposed Non-pressure chronic ulcer of right calf with fat layer exposed Procedures Wound #21 Pre-procedure diagnosis of Wound #21 is a Venous Leg Ulcer located on the Left,Distal Lower Leg . There was a Four Layer Compression Therapy Procedure by Zenaida Deed, RN. Post procedure Diagnosis Wound #21: Same as Pre-Procedure Wound #22 Pre-procedure diagnosis of Wound #22 is a Venous Leg Ulcer located on the Right,Medial Lower Leg . There was a Four Layer Compression  Therapy Procedure by Zenaida Deed, RN. Post procedure Diagnosis Wound #22: Same as Pre-Procedure Plan Follow-up Appointments: Return Appointment in: - 2 months Bathing/ Shower/ Hygiene: May shower with protection but do not get wound dressing(s) wet. Edema Control - Lymphedema / SCD / Other: Elevate legs to the level of the heart or above for 30 minutes daily and/or when sitting, a frequency of: - throughout the day Avoid standing for long periods of time. WOUND #21: - Lower Leg Wound Laterality: Left, Distal Cleanser: Soap and Water 3 x Per Week/30 Days Discharge Instructions: May shower and wash wound with dial antibacterial soap and water prior to dressing change. Cleanser: Wound Cleanser 3 x Per Week/30 Days Discharge Instructions: Cleanse the wound with wound cleanser prior to applying a clean dressing using gauze sponges, not tissue or cotton balls. Peri-Wound Care: Zinc Oxide Ointment 30g tube 3 x Per Week/30 Days Discharge Instructions: Apply Zinc Oxide to periwound with each dressing change Peri-Wound Care: Sween Lotion (Moisturizing lotion) 3 x Per Week/30 Days Discharge Instructions: Apply moisturizing lotion as directed Prim Dressing: KerraCel Ag Gelling Fiber Dressing, 4x5 in (silver alginate) 3 x Per Week/30 Days ary Discharge Instructions: Apply silver alginate to wound bed as instructed Secondary Dressing: Woven Gauze Sponge, Non-Sterile 4x4 in 3 x Per Week/30 Days Discharge Instructions: Apply over primary dressing as directed. Secondary Dressing: ABD Pad, 8x10 3 x Per Week/30 Days Discharge Instructions: Apply over primary dressing as directed. Com pression Wrap: FourPress (4 layer compression wrap) 3 x Per Week/30 Days Discharge Instructions: Apply four layer compression as directed. WOUND #22: - Lower Leg Wound Laterality: Right, Medial Cleanser: Soap and Water 3 x Per Week/30 Days Discharge Instructions: May shower and wash wound with dial antibacterial soap and  water prior to dressing change. Cleanser: Wound Cleanser 3 x Per Week/30 Days Discharge Instructions: Cleanse the wound with wound cleanser prior to applying a clean dressing using gauze sponges, not tissue or cotton balls. Peri-Wound Care: Zinc Oxide Ointment 30g tube 3 x Per Week/30 Days Discharge Instructions: Apply Zinc Oxide to periwound with each  dressing change Peri-Wound Care: Sween Lotion (Moisturizing lotion) 3 x Per Week/30 Days Discharge Instructions: Apply moisturizing lotion as directed Prim Dressing: KerraCel Ag Gelling Fiber Dressing, 4x5 in (silver alginate) 3 x Per Week/30 Days ary Discharge Instructions: Apply silver alginate to wound bed as instructed Secondary Dressing: Woven Gauze Sponge, Non-Sterile 4x4 in 3 x Per Week/30 Days Discharge Instructions: Apply over primary dressing as directed. Secondary Dressing: ABD Pad, 8x10 3 x Per Week/30 Days Discharge Instructions: Apply over primary dressing as directed. Com pression Wrap: FourPress (4 layer compression wrap) 3 x Per Week/30 Days Discharge Instructions: Apply four layer compression as directed. 1. We talked about this in the room myself and the case manager. At this point I do not see any other good alternative other than to continue silver alginate which at best is for economical for this facility. He did not have a good chance for [facility] and nothing else really comes in a size that would be feasible 2. The only other thing I can think of is really consulting plastic surgery. We will need to look back on what happened to them at [facility] I know they biopsied this multiple times but I am pretty sure he never did have plastic surgery Electronic Signature(s) Signed: 12/28/2020 5:22:04 PM By: Linton Ham MD Entered By: Linton Ham on 12/28/2020 16:13:56 -------------------------------------------------------------------------------- SuperBill Details Patient Name: Date of Service: [name]  12/28/2020 Medical Record Number: [id number] Patient Account Number: [id number] Date of Birth/Sex: Treating RN: Feb 13, 1956 (65 y.o. [name] Primary Care Provider: [name] Other Clinician: Referring Provider: Treating Provider/Extender: [name] in Treatment: 43 Diagnosis Coding ICD-10 Codes Code Description [phone] Chronic venous hypertension (idiopathic) with ulcer and inflammation of bilateral lower extremity I89.0 Lymphedema, not elsewhere classified L97.222 Non-pressure chronic ulcer of left calf with fat layer exposed L97.212 Non-pressure chronic ulcer of right calf with fat layer exposed Facility Procedures CPT4: Code [id number] Description: 81 BILATERAL: Application of multi-layer venous compression system; leg (below knee), including ankle and t. Modifier: Quantity: 1 Physician Procedures : CPT4 Code Description Modifier [id number] - WC PHYS LEVEL 3 - EST PT ICD-10 Diagnosis Description I87.333 Chronic venous hypertension (idiopathic) with ulcer and inflammation of bilateral lower extremity I89.0 Lymphedema, not elsewhere classified  L97.222 Non-pressure chronic ulcer of left calf with fat layer exposed L97.212 Non-pressure chronic ulcer of right calf with fat layer exposed Quantity: 1 Electronic Signature(s) Signed: 12/28/2020 5:22:04 PM By: Linton Ham MD Entered By: Linton Ham on 12/28/2020 16:14:16

## 2021-02-22 ENCOUNTER — Encounter (HOSPITAL_BASED_OUTPATIENT_CLINIC_OR_DEPARTMENT_OTHER): Payer: Medicare Other | Attending: Internal Medicine | Admitting: Internal Medicine

## 2021-04-03 ENCOUNTER — Other Ambulatory Visit: Payer: Self-pay

## 2021-04-03 ENCOUNTER — Emergency Department (HOSPITAL_COMMUNITY)
Admission: EM | Admit: 2021-04-03 | Discharge: 2021-04-04 | Disposition: A | Discharge status: Home / Self Care | Payer: No Typology Code available for payment source | Attending: Emergency Medicine | Admitting: Emergency Medicine

## 2021-04-03 ENCOUNTER — Emergency Department (HOSPITAL_COMMUNITY): Payer: No Typology Code available for payment source

## 2021-04-03 ENCOUNTER — Encounter (HOSPITAL_COMMUNITY): Payer: Self-pay

## 2021-04-03 DIAGNOSIS — Z794 Long term (current) use of insulin: Secondary | ICD-10-CM | POA: Insufficient documentation

## 2021-04-03 DIAGNOSIS — W06XXXA Fall from bed, initial encounter: Secondary | ICD-10-CM | POA: Insufficient documentation

## 2021-04-03 DIAGNOSIS — W19XXXA Unspecified fall, initial encounter: Secondary | ICD-10-CM

## 2021-04-03 DIAGNOSIS — Z7982 Long term (current) use of aspirin: Secondary | ICD-10-CM | POA: Diagnosis not present

## 2021-04-03 DIAGNOSIS — F17210 Nicotine dependence, cigarettes, uncomplicated: Secondary | ICD-10-CM | POA: Diagnosis not present

## 2021-04-03 DIAGNOSIS — S0083XA Contusion of other part of head, initial encounter: Secondary | ICD-10-CM | POA: Insufficient documentation

## 2021-04-03 DIAGNOSIS — Z79899 Other long term (current) drug therapy: Secondary | ICD-10-CM | POA: Diagnosis not present

## 2021-04-03 DIAGNOSIS — E1151 Type 2 diabetes mellitus with diabetic peripheral angiopathy without gangrene: Secondary | ICD-10-CM | POA: Insufficient documentation

## 2021-04-03 DIAGNOSIS — Z7984 Long term (current) use of oral hypoglycemic drugs: Secondary | ICD-10-CM | POA: Insufficient documentation

## 2021-04-03 DIAGNOSIS — I110 Hypertensive heart disease with heart failure: Secondary | ICD-10-CM | POA: Diagnosis not present

## 2021-04-03 DIAGNOSIS — I5031 Acute diastolic (congestive) heart failure: Secondary | ICD-10-CM | POA: Insufficient documentation

## 2021-04-03 DIAGNOSIS — S0990XA Unspecified injury of head, initial encounter: Secondary | ICD-10-CM | POA: Diagnosis present

## 2021-04-03 DIAGNOSIS — S0093XA Contusion of unspecified part of head, initial encounter: Secondary | ICD-10-CM

## 2021-04-03 MED ORDER — IBUPROFEN 800 MG PO TABS
800.0000 mg | ORAL_TABLET | Freq: Once | ORAL | Status: AC
  Administered 2021-04-03: 800 mg via ORAL
  Filled 2021-04-03: qty 1

## 2021-04-03 NOTE — Discharge Instructions (Addendum)
No major traumatic injuries.  Recommend ice to his forehead as needed.

## 2021-04-03 NOTE — ED Triage Notes (Signed)
Patient here with c/o fall forward. Patient report hitting his head on hard floor. Pt denies LOC. Not on blood thinners. A/O x 4.

## 2021-04-03 NOTE — ED Notes (Signed)
Pt informed the need for urine specimen. Pt states he is able to use urinal without assistance. Pt provided with urinal at bedside.

## 2021-04-03 NOTE — ED Notes (Signed)
Report given to [facility].

## 2021-04-03 NOTE — ED Notes (Signed)
[facility] called for transport.  

## 2021-04-03 NOTE — ED Provider Notes (Signed)
[facility] DEPT Provider Note   CSN: [id number] Arrival date & time: 04/03/21  1208     History Chief Complaint  Patient presents with   [name]    [name] is a 65 y.o. male.  Patient states that he fell while transferring from his bed to his walker.  States that he landed on his face.  Did not lose consciousness.  Not on blood thinners.  May be starting to develop some pain to the left shoulder.  The history is provided by the patient.  Fall This is a new problem. The current episode started less than 1 hour ago. The problem has been resolved. Pertinent negatives include no chest pain, no abdominal pain, no headaches and no shortness of breath. Nothing aggravates the symptoms. Nothing relieves the symptoms. He has tried nothing for the symptoms. The treatment provided no relief.      Past Medical History:  Diagnosis Date   Anemia    Arthritis    "left hip" (04/26/2015)   Cellulitis and abscess of leg hositalized 04/25/2015   left   Chronic hip pain    Depression    GERD (gastroesophageal reflux disease)    Hypercholesterolemia    Hypertension    Pneumonia 1960's X 1   Primary skin malignancy with unknown cell type 12/10/2016   Sleep apnea    "couldn't wear the mask" (04/26/2015)   Thalassemia minor    Type II diabetes mellitus ([place])     Patient Active Problem List   Diagnosis Date Noted   Benign prostatic hyperplasia with urinary obstruction 08/29/2020   Overactive bladder 08/29/2020   Type II diabetes mellitus, uncontrolled ([place]) 08/29/2020   Impotence 08/29/2020   Urge incontinence of urine 08/29/2020   Chronic respiratory failure with hypoxia ([place]) 04/06/2019   Dyslipidemia 04/06/2019   Hypomagnesemia 04/06/2019   MDD (major depressive disorder) 04/06/2019   Psychosis ([place]) 04/05/2019   Dyspnea 02/12/2019   Pain in left wrist 97/35/3299   Diastolic dysfunction 24/26/8341   Primary skin malignancy with unknown cell type 96/22/2979    Acute diastolic heart failure ([place]) 08/25/2016   Hypoxia 08/21/2016   Acute respiratory failure with hypoxia and hypercapnia ([facility]) 08/21/2016   Moderate protein-calorie malnutrition ([place]) 08/21/2016   Stasis edema of both lower extremities 08/21/2016   Chronic pain 08/21/2016   Tobacco use disorder 02/23/2016   Venous stasis syndrome 02/09/2016   Spondylosis without myelopathy or radiculopathy, lumbar region 12/13/2015   AKI (acute kidney injury) ([place]) 11/02/2015   Hyperkalemia 11/02/2015   Hypoglycemia 11/02/2015   Cellulitis    Leg ulcer ([place])    Nonhealing ulcer of left lower extremity ([place])    Pain of left leg    Cellulitis of leg 04/25/2015   Degenerative arthritis of hip 04/25/2015   Cellulitis and abscess of leg    Microcytic anemia 03/27/2015   Hypertension    Fever    DM (diabetes mellitus), secondary, uncontrolled, with peripheral vascular complications ([place]) 89/21/1941   MSSA (methicillin susceptible Staphylococcus aureus) infection 03/20/2015   Leukocytosis 03/20/2015   Anemia, iron deficiency 03/20/2015   Cellulitis of left lower extremity    Pyomyositis    Systemic infection ([place]) 03/14/2015    Past Surgical History:  Procedure Laterality Date   INGUINAL HERNIA REPAIR Right ~ 2004   SHOULDER ARTHROSCOPY W/ ROTATOR CUFF REPAIR Right ~ 2004   TOENAIL EXCISION Right 1980's X 2   "big toe"       Family History  Problem Relation  Age of Onset   Diabetes Mellitus II Mother    Diabetes Mellitus II Father    Diabetes Mellitus II Brother     Social History   Tobacco Use   Smoking status: Every Day    Packs/day: 0.12    Years: 45.00    Pack years: 5.40    Types: Cigarettes   Smokeless tobacco: Never  Vaping Use   Vaping Use: Never used  Substance Use Topics   Alcohol use: No    Comment: 04/26/2015 "I may drink 1/2 glass of wine or a couple beers 1-2 times/month; if that"   Drug use: No    Comment: "stopped all drug use in 2005; S/P SARP program in  [place]"    Home Medications Prior to Admission medications   Medication Sig Start Date End Date Taking? Authorizing Provider  acetaminophen (TYLENOL) 325 MG tablet Take 2 tablets (650 mg total) by mouth every 6 (six) hours as needed for mild pain (or Fever >/= 101). 03/23/15   Robbie Lis, MD  albuterol (PROVENTIL) (2.5 MG/3ML) 0.083% nebulizer solution Inhale 3 mLs into the lungs every 4 (four) hours as needed for shortness of breath. 11/29/18   [provider]  Amino Acids-Protein Hydrolys (FEEDING SUPPLEMENT, PRO-STAT SUGAR FREE 64,) LIQD Take 30 mLs by mouth 3 (three) times daily with meals.    [provider]  amitriptyline (ELAVIL) 100 MG tablet Take 100 mg by mouth at bedtime. 02/09/19   [provider]  aspirin EC 81 MG tablet Take 81 mg by mouth every morning.    [provider]  atorvastatin (LIPITOR) 80 MG tablet Take 80 mg by mouth daily.    [provider]  carvedilol (COREG) 3.125 MG tablet Take 1 tablet (3.125 mg total) by mouth 2 (two) times daily with a meal. 11/13/15   Thurnell Lose, MD  DULoxetine (CYMBALTA) 20 MG capsule Take 40 mg by mouth 2 (two) times daily.    [provider]  feeding supplement, ENSURE ENLIVE, (ENSURE ENLIVE) LIQD Take 237 mLs by mouth daily. 02/22/19   Ghimire, Henreitta Leber, MD  ferrous sulfate 325 (65 FE) MG tablet Take 325 mg by mouth 3 (three) times daily with meals.    [provider]  insulin aspart (NOVOLOG) 100 UNIT/ML injection Before each meal 3 times a day, 140-199 - 2 units, 200-250 - 4 units, 251-299 - 6 units,  300-349 - 8 units,  350 or above 10 units. Dispense syringes and needles as needed, Ok to switch to PEN if approved. Substitute to any brand approved. DX DM2, Code E11.65 11/13/15   Thurnell Lose, MD  insulin glargine (LANTUS) 100 UNIT/ML injection Inject 0.2 mLs (20 Units total) into the skin daily. 02/22/19   Ghimire, Henreitta Leber, MD  lisinopril (PRINIVIL,ZESTRIL) 2.5 MG  tablet Take 1 tablet (2.5 mg total) by mouth daily. 11/13/15   Thurnell Lose, MD  metFORMIN (GLUCOPHAGE) 500 MG tablet Take 500 mg by mouth daily.  10/15/17   [provider]  mirtazapine (REMERON) 15 MG tablet Take 15 mg by mouth at bedtime.    [provider]  Multiple Vitamin (MULTIVITAMIN WITH MINERALS) TABS tablet Take 1 tablet by mouth daily. 03/23/15   Robbie Lis, MD  Multiple Vitamins-Minerals (CENTRUM ADULTS PO) Take 1 tablet by mouth daily.     [provider]  oxyCODONE (ROXICODONE) 5 MG immediate release tablet Take 1 tablet (5 mg total) by mouth every 6 (six) hours as needed  for up to 12 doses for breakthrough pain. 05/08/20   Jahel Wavra, DO  pantoprazole (PROTONIX) 40 MG tablet Take 1 tablet (40 mg total) by mouth 2 (two) times daily. 11/13/15   Thurnell Lose, MD  potassium chloride SA (K-DUR) 20 MEQ tablet Take 1 tablet (20 mEq total) by mouth daily. 02/23/19   Ghimire, Henreitta Leber, MD  tamsulosin (FLOMAX) 0.4 MG CAPS capsule Take 0.4 mg by mouth daily after supper.    [provider]  tiZANidine (ZANAFLEX) 4 MG tablet Take 4 mg by mouth every 8 (eight) hours as needed for muscle spasms.    [provider]  torsemide (DEMADEX) 20 MG tablet Take 1 tablet (20 mg total) by mouth daily. 02/23/19   Ghimire, Henreitta Leber, MD    Allergies    Other and Sulfa antibiotics  Review of Systems   Review of Systems  Constitutional:  Negative for chills and fever.  HENT:  Negative for ear pain and sore throat.   Eyes:  Negative for pain and visual disturbance.  Respiratory:  Negative for cough and shortness of breath.   Cardiovascular:  Negative for chest pain and palpitations.  Gastrointestinal:  Negative for abdominal pain and vomiting.  Genitourinary:  Negative for dysuria and hematuria.  Musculoskeletal:  Positive for arthralgias. Negative for back pain.  Skin:  Negative for color change and rash.  Neurological:  Negative for seizures,  syncope and headaches.  All other systems reviewed and are negative.  Physical Exam Updated Vital Signs BP 128/70   Pulse 70   Temp 98.1 F (36.7 C) (Oral)   Resp 16   Ht 6\' 5"  (1.956 m)   Wt 115.7 kg   SpO2 99%   BMI 30.24 kg/m   Physical Exam Vitals and nursing note reviewed.  Constitutional:      General: He is not in acute distress.    Appearance: He is well-developed. He is not ill-appearing.  HENT:     Head:     Comments: Hematoma over the left side of the forehead    Mouth/Throat:     Mouth: Mucous membranes are moist.  Eyes:     Extraocular Movements: Extraocular movements intact.     Conjunctiva/sclera: Conjunctivae normal.     Pupils: Pupils are equal, round, and reactive to light.  Cardiovascular:     Rate and Rhythm: Normal rate and regular rhythm.     Pulses: Normal pulses.     Heart sounds: Normal heart sounds. No murmur Krebbs. Pulmonary:     Effort: Pulmonary effort is normal. No respiratory distress.     Breath sounds: Normal breath sounds.  Abdominal:     Palpations: Abdomen is soft.     Tenderness: There is no abdominal tenderness.  Musculoskeletal:        General: Tenderness (TTP to left shoulder) present. Normal range of motion.     Cervical back: Normal range of motion and neck supple. No tenderness.     Comments: No midline spinal pain   Skin:    General: Skin is warm and dry.     Capillary Refill: Capillary refill takes less than 2 seconds.  Neurological:     General: No focal deficit present.     Mental Status: He is alert and oriented to person, place, and time.     Cranial Nerves: No cranial nerve deficit.     Sensory: No sensory deficit.     Motor: No weakness.     Coordination: Coordination normal.  ED Results / Procedures / Treatments   Labs (all labs ordered are listed, but only abnormal results are displayed) Labs Reviewed - No data to display  EKG None  Radiology DG Chest 2 View  Result Date: 04/03/2021 CLINICAL  DATA:  Status post fall with a blow to the head. EXAM: CHEST - 2 VIEW COMPARISON:  Single-view of the chest 02/16/2019. FINDINGS: Lung volumes are low. Elevation of the right hemidiaphragm again seen. There is cardiomegaly and vascular congestion. No consolidative process, pneumothorax or pleural effusion. No acute or focal bony abnormality. IMPRESSION: Cardiomegaly and pulmonary vascular congestion. Electronically Signed   By: Inge Rise M.D.   On: 04/03/2021 13:52   CT Head Wo Contrast  Result Date: 04/03/2021 CLINICAL DATA:  Status post fall.  Hit head on hardwood floor. EXAM: CT HEAD WITHOUT CONTRAST CT CERVICAL SPINE WITHOUT CONTRAST TECHNIQUE: Multidetector CT imaging of the head and cervical spine was performed following the standard protocol without intravenous contrast. Multiplanar CT image reconstructions of the cervical spine were also generated. COMPARISON:  None. FINDINGS: CT HEAD FINDINGS Brain: No evidence of acute infarction, hemorrhage, hydrocephalus, extra-axial collection or mass lesion/mass effect. Vascular: No hyperdense vessel or unexpected calcification. Skull: Normal. Negative for fracture or focal lesion. Sinuses/Orbits: Mild mucosal thickening is noted involving the left maxillary sinus. The remaining paranasal sinuses appear clear. Other: Left frontal scalp laceration. CT CERVICAL SPINE FINDINGS Alignment: There is reversal of normal cervical lordosis which is likely related to patient positioning. Skull base and vertebrae: No acute fracture. No primary bone lesion or focal pathologic process. Soft tissues and spinal canal: No prevertebral fluid or swelling. No visible canal hematoma. Disc levels: Marked multi level ventral endplate spurring and disc space narrowing is identified involving C3-4 through C6-7. Upper chest: Negative. Other: None IMPRESSION: 1. No acute intracranial abnormalities. 2. Left frontal scalp laceration. 3. No evidence for cervical spine fracture. 4.  Advanced cervical degenerative disc disease. Electronically Signed   By: Kerby Moors M.D.   On: 04/03/2021 14:11   CT Cervical Spine Wo Contrast  Result Date: 04/03/2021 CLINICAL DATA:  Status post fall.  Hit head on hardwood floor. EXAM: CT HEAD WITHOUT CONTRAST CT CERVICAL SPINE WITHOUT CONTRAST TECHNIQUE: Multidetector CT imaging of the head and cervical spine was performed following the standard protocol without intravenous contrast. Multiplanar CT image reconstructions of the cervical spine were also generated. COMPARISON:  None. FINDINGS: CT HEAD FINDINGS Brain: No evidence of acute infarction, hemorrhage, hydrocephalus, extra-axial collection or mass lesion/mass effect. Vascular: No hyperdense vessel or unexpected calcification. Skull: Normal. Negative for fracture or focal lesion. Sinuses/Orbits: Mild mucosal thickening is noted involving the left maxillary sinus. The remaining paranasal sinuses appear clear. Other: Left frontal scalp laceration. CT CERVICAL SPINE FINDINGS Alignment: There is reversal of normal cervical lordosis which is likely related to patient positioning. Skull base and vertebrae: No acute fracture. No primary bone lesion or focal pathologic process. Soft tissues and spinal canal: No prevertebral fluid or swelling. No visible canal hematoma. Disc levels: Marked multi level ventral endplate spurring and disc space narrowing is identified involving C3-4 through C6-7. Upper chest: Negative. Other: None IMPRESSION: 1. No acute intracranial abnormalities. 2. Left frontal scalp laceration. 3. No evidence for cervical spine fracture. 4. Advanced cervical degenerative disc disease. Electronically Signed   By: Kerby Moors M.D.   On: 04/03/2021 14:11   DG Shoulder Left  Result Date: 04/03/2021 CLINICAL DATA:  Pain in shoulder after fall. EXAM: LEFT SHOULDER - 2+ VIEW COMPARISON:  None. FINDINGS: There is no evidence of fracture or dislocation. Mild AC joint osteoarthritis. Soft  tissues are unremarkable. IMPRESSION: 1. No acute findings. 2. Mild AC joint osteoarthritis. Electronically Signed   By: Kerby Moors M.D.   On: 04/03/2021 13:45   DG Hip Unilat With Pelvis 2-3 Views Left  Result Date: 04/03/2021 CLINICAL DATA:  Pain.  Fall. EXAM: DG HIP (WITH OR WITHOUT PELVIS) 2-3V LEFT COMPARISON:  CT left hip 10/17/2020 FINDINGS: No acute fracture or dislocation identified. Severe left hip osteoarthritis. Mild right hip osteoarthritis noted. IMPRESSION: 1. No acute findings. 2. Severe left hip osteoarthritis. Electronically Signed   By: Kerby Moors M.D.   On: 04/03/2021 13:47    Procedures Procedures   Medications Ordered in ED Medications - No data to display  ED Course  I have reviewed the triage vital signs and the nursing notes.  Pertinent labs & imaging results that were available during my care of the patient were reviewed by me and considered in my medical decision making (see chart for details).    MDM Rules/Calculators/A&P                          Johathan Province is here after mechanical fall.  Not on blood thinners.  On chronic O2.  Comes from nursing home after fall.  History of psychosis, diabetes, overactive bladder, heart failure.  Vital signs are normal.  No fever.  States he had a mechanical fall while transferring from his bed to his walker.  Uses a walker but also wheelchair.  Slipped forward and hit his face.  Has a small hematoma over the left side of his forehead.  Head CT and neck CT unremarkable.  X-ray of the left shoulder and left hip show no acute fractures.  Chest x-ray overall with no traumatic injuries.  Overall he appears well and safe for discharge back to facility after mechanical fall.  This chart was dictated using voice recognition software.  Despite best efforts to proofread,  errors can occur which can change the documentation meaning.   Final Clinical Impression(s) / ED Diagnoses Final diagnoses:  Fall, initial encounter   Contusion of head, unspecified part of head, initial encounter    Rx / DC Orders ED Discharge Orders     None        Lennice Sites, DO 04/03/21 1446

## 2021-06-24 ENCOUNTER — Inpatient Hospital Stay (HOSPITAL_COMMUNITY)
Admission: EM | Admit: 2021-06-24 | Discharge: 2021-06-29 | DRG: 193 | Disposition: A | Discharge status: Skilled Nursing Facility | Payer: Medicare Other | Source: Skilled Nursing Facility | Attending: Internal Medicine | Admitting: Internal Medicine

## 2021-06-24 ENCOUNTER — Encounter (HOSPITAL_COMMUNITY): Payer: Self-pay

## 2021-06-24 ENCOUNTER — Other Ambulatory Visit: Payer: Self-pay

## 2021-06-24 ENCOUNTER — Emergency Department (HOSPITAL_COMMUNITY): Payer: Medicare Other

## 2021-06-24 DIAGNOSIS — F17210 Nicotine dependence, cigarettes, uncomplicated: Secondary | ICD-10-CM | POA: Diagnosis present

## 2021-06-24 DIAGNOSIS — Z7982 Long term (current) use of aspirin: Secondary | ICD-10-CM

## 2021-06-24 DIAGNOSIS — B349 Viral infection, unspecified: Secondary | ICD-10-CM | POA: Diagnosis present

## 2021-06-24 DIAGNOSIS — Z22322 Carrier or suspected carrier of Methicillin resistant Staphylococcus aureus: Secondary | ICD-10-CM

## 2021-06-24 DIAGNOSIS — E1165 Type 2 diabetes mellitus with hyperglycemia: Secondary | ICD-10-CM | POA: Diagnosis present

## 2021-06-24 DIAGNOSIS — J9621 Acute and chronic respiratory failure with hypoxia: Secondary | ICD-10-CM | POA: Diagnosis present

## 2021-06-24 DIAGNOSIS — D563 Thalassemia minor: Secondary | ICD-10-CM | POA: Diagnosis present

## 2021-06-24 DIAGNOSIS — D509 Iron deficiency anemia, unspecified: Secondary | ICD-10-CM | POA: Diagnosis present

## 2021-06-24 DIAGNOSIS — Z20822 Contact with and (suspected) exposure to covid-19: Secondary | ICD-10-CM | POA: Diagnosis present

## 2021-06-24 DIAGNOSIS — E119 Type 2 diabetes mellitus without complications: Secondary | ICD-10-CM

## 2021-06-24 DIAGNOSIS — I87303 Chronic venous hypertension (idiopathic) without complications of bilateral lower extremity: Secondary | ICD-10-CM | POA: Diagnosis present

## 2021-06-24 DIAGNOSIS — Z87891 Personal history of nicotine dependence: Secondary | ICD-10-CM

## 2021-06-24 DIAGNOSIS — I110 Hypertensive heart disease with heart failure: Secondary | ICD-10-CM | POA: Diagnosis present

## 2021-06-24 DIAGNOSIS — Z79899 Other long term (current) drug therapy: Secondary | ICD-10-CM

## 2021-06-24 DIAGNOSIS — D649 Anemia, unspecified: Secondary | ICD-10-CM | POA: Diagnosis present

## 2021-06-24 DIAGNOSIS — Z888 Allergy status to other drugs, medicaments and biological substances status: Secondary | ICD-10-CM

## 2021-06-24 DIAGNOSIS — N138 Other obstructive and reflux uropathy: Secondary | ICD-10-CM | POA: Diagnosis present

## 2021-06-24 DIAGNOSIS — G4733 Obstructive sleep apnea (adult) (pediatric): Secondary | ICD-10-CM | POA: Diagnosis present

## 2021-06-24 DIAGNOSIS — J129 Viral pneumonia, unspecified: Principal | ICD-10-CM | POA: Diagnosis present

## 2021-06-24 DIAGNOSIS — I5032 Chronic diastolic (congestive) heart failure: Secondary | ICD-10-CM | POA: Diagnosis present

## 2021-06-24 DIAGNOSIS — N401 Enlarged prostate with lower urinary tract symptoms: Secondary | ICD-10-CM | POA: Diagnosis present

## 2021-06-24 DIAGNOSIS — Z8619 Personal history of other infectious and parasitic diseases: Secondary | ICD-10-CM

## 2021-06-24 DIAGNOSIS — Z9981 Dependence on supplemental oxygen: Secondary | ICD-10-CM

## 2021-06-24 DIAGNOSIS — J189 Pneumonia, unspecified organism: Secondary | ICD-10-CM | POA: Diagnosis not present

## 2021-06-24 DIAGNOSIS — E7800 Pure hypercholesterolemia, unspecified: Secondary | ICD-10-CM | POA: Diagnosis present

## 2021-06-24 DIAGNOSIS — I2510 Atherosclerotic heart disease of native coronary artery without angina pectoris: Secondary | ICD-10-CM | POA: Diagnosis present

## 2021-06-24 DIAGNOSIS — G8929 Other chronic pain: Secondary | ICD-10-CM | POA: Diagnosis present

## 2021-06-24 DIAGNOSIS — L97919 Non-pressure chronic ulcer of unspecified part of right lower leg with unspecified severity: Secondary | ICD-10-CM | POA: Diagnosis present

## 2021-06-24 DIAGNOSIS — Z79891 Long term (current) use of opiate analgesic: Secondary | ICD-10-CM

## 2021-06-24 DIAGNOSIS — N179 Acute kidney failure, unspecified: Secondary | ICD-10-CM | POA: Diagnosis present

## 2021-06-24 DIAGNOSIS — F32A Depression, unspecified: Secondary | ICD-10-CM | POA: Diagnosis present

## 2021-06-24 DIAGNOSIS — Z882 Allergy status to sulfonamides status: Secondary | ICD-10-CM

## 2021-06-24 DIAGNOSIS — L309 Dermatitis, unspecified: Secondary | ICD-10-CM | POA: Diagnosis present

## 2021-06-24 DIAGNOSIS — Z833 Family history of diabetes mellitus: Secondary | ICD-10-CM

## 2021-06-24 DIAGNOSIS — M1990 Unspecified osteoarthritis, unspecified site: Secondary | ICD-10-CM | POA: Diagnosis present

## 2021-06-24 DIAGNOSIS — Z794 Long term (current) use of insulin: Secondary | ICD-10-CM

## 2021-06-24 DIAGNOSIS — E785 Hyperlipidemia, unspecified: Secondary | ICD-10-CM | POA: Diagnosis present

## 2021-06-24 DIAGNOSIS — Z7984 Long term (current) use of oral hypoglycemic drugs: Secondary | ICD-10-CM

## 2021-06-24 DIAGNOSIS — E669 Obesity, unspecified: Secondary | ICD-10-CM | POA: Diagnosis present

## 2021-06-24 DIAGNOSIS — F329 Major depressive disorder, single episode, unspecified: Secondary | ICD-10-CM | POA: Diagnosis present

## 2021-06-24 DIAGNOSIS — K219 Gastro-esophageal reflux disease without esophagitis: Secondary | ICD-10-CM | POA: Diagnosis present

## 2021-06-24 DIAGNOSIS — L97929 Non-pressure chronic ulcer of unspecified part of left lower leg with unspecified severity: Secondary | ICD-10-CM | POA: Diagnosis present

## 2021-06-24 DIAGNOSIS — J9622 Acute and chronic respiratory failure with hypercapnia: Secondary | ICD-10-CM | POA: Diagnosis present

## 2021-06-24 DIAGNOSIS — E861 Hypovolemia: Secondary | ICD-10-CM | POA: Diagnosis present

## 2021-06-24 DIAGNOSIS — Z6831 Body mass index (BMI) 31.0-31.9, adult: Secondary | ICD-10-CM

## 2021-06-24 LAB — HEPATIC FUNCTION PANEL
ALT: 17 U/L (ref 0–44)
AST: 18 U/L (ref 15–41)
Albumin: 2.9 g/dL — ABNORMAL LOW (ref 3.5–5.0)
Alkaline Phosphatase: 83 U/L (ref 38–126)
Bilirubin, Direct: <0.1 mg/dL (ref 0.0–0.2)
Total Bilirubin: 0.5 mg/dL (ref 0.3–1.2)
Total Protein: 7.3 g/dL (ref 6.5–8.1)

## 2021-06-24 LAB — BASIC METABOLIC PANEL
Anion gap: 6 (ref 5–15)
BUN: 30 mg/dL — ABNORMAL HIGH (ref 8–23)
CO2: 34 mmol/L — ABNORMAL HIGH (ref 22–32)
Calcium: 8.7 mg/dL — ABNORMAL LOW (ref 8.9–10.3)
Chloride: 101 mmol/L (ref 98–111)
Creatinine, Ser: 1.32 mg/dL — ABNORMAL HIGH (ref 0.61–1.24)
GFR, Estimated: 59 mL/min — ABNORMAL LOW (ref >60)
Glucose, Bld: 226 mg/dL — ABNORMAL HIGH (ref 70–99)
Potassium: 4.6 mmol/L (ref 3.5–5.1)
Sodium: 141 mmol/L (ref 135–145)

## 2021-06-24 LAB — CBC WITH DIFFERENTIAL/PLATELET
Abs Immature Granulocytes: 0.06 10*3/uL (ref 0.00–0.07)
Basophils Absolute: 0 10*3/uL (ref 0.0–0.1)
Basophils Relative: 0 %
Eosinophils Absolute: 0.1 10*3/uL (ref 0.0–0.5)
Eosinophils Relative: 1 %
HCT: 38.6 % — ABNORMAL LOW (ref 39.0–52.0)
Hemoglobin: 10.7 g/dL — ABNORMAL LOW (ref 13.0–17.0)
Immature Granulocytes: 1 %
Lymphocytes Relative: 8 %
Lymphs Abs: 0.9 10*3/uL (ref 0.7–4.0)
MCH: 19.9 pg — ABNORMAL LOW (ref 26.0–34.0)
MCHC: 27.7 g/dL — ABNORMAL LOW (ref 30.0–36.0)
MCV: 71.9 fL — ABNORMAL LOW (ref 80.0–100.0)
Monocytes Absolute: 0.8 10*3/uL (ref 0.1–1.0)
Monocytes Relative: 7 %
Neutro Abs: 9.6 10*3/uL — ABNORMAL HIGH (ref 1.7–7.7)
Neutrophils Relative %: 83 %
Platelets: 235 10*3/uL (ref 150–400)
RBC: 5.37 MIL/uL (ref 4.22–5.81)
RDW: 21.7 % — ABNORMAL HIGH (ref 11.5–15.5)
WBC: 11.5 10*3/uL — ABNORMAL HIGH (ref 4.0–10.5)
nRBC: 0 % (ref 0.0–0.2)

## 2021-06-24 LAB — RESP PANEL BY RT-PCR (FLU A&B, COVID) ARPGX2
Influenza A by PCR: NEGATIVE
Influenza B by PCR: NEGATIVE
SARS Coronavirus 2 by RT PCR: NEGATIVE

## 2021-06-24 LAB — BRAIN NATRIURETIC PEPTIDE: B Natriuretic Peptide: 40.1 pg/mL (ref 0.0–100.0)

## 2021-06-24 LAB — TROPONIN I (HIGH SENSITIVITY): Troponin I (High Sensitivity): 6 ng/L (ref <18)

## 2021-06-24 MED ORDER — ACETAMINOPHEN 500 MG PO TABS
1000.0000 mg | ORAL_TABLET | Freq: Once | ORAL | Status: AC
  Administered 2021-06-24: 1000 mg via ORAL
  Filled 2021-06-24: qty 2

## 2021-06-24 MED ORDER — SODIUM CHLORIDE 0.9 % IV SOLN
1.0000 g | Freq: Once | INTRAVENOUS | Status: AC
  Administered 2021-06-24: 1 g via INTRAVENOUS
  Filled 2021-06-24: qty 10

## 2021-06-24 MED ORDER — SODIUM CHLORIDE 0.9 % IV SOLN
500.0000 mg | Freq: Once | INTRAVENOUS | Status: AC
  Administered 2021-06-24: 500 mg via INTRAVENOUS
  Filled 2021-06-24: qty 500

## 2021-06-24 NOTE — ED Notes (Signed)
Patients family member Hinton Dyer, [phone].

## 2021-06-24 NOTE — ED Provider Notes (Signed)
[facility] DEPT Provider Note   CSN: [id number] Arrival date & time: 06/24/21  2018     History Chief Complaint  Patient presents with   Fatigue   Low Oxygen Saturation    [name] is a 65 y.o. male.  HPI  65 year old male with a history of pneumonia, OSA, DM2, chronic hypoxemic respiratory failure on 2 L O2 via nasal cannula, presenting to the emergency department with acute hypoxic respiratory failure.  The patient comes via EMS from [facility].  He was recently treated for pneumonia a month ago.  He is on 2 L O2 via nasal cannula at baseline.  Staff at his facility stated that he was around 77% on 2 L and was subsequently increased to 5 L with improvement in his saturations.  He arrives with a fever to 101.1.  He endorses a cough productive of yellow to white sputum over the past couple of days.  He endorses chills.  CBG on arrival was 269.  He is status post 1 g of Tylenol.  Past Medical History:  Diagnosis Date   Anemia    Arthritis    "left hip" (04/26/2015)   Cellulitis and abscess of leg hositalized 04/25/2015   left   Chronic diastolic CHF (congestive heart failure) ([place]) 06/25/2021   Chronic hip pain    Depression    GERD (gastroesophageal reflux disease)    Hypercholesterolemia    Hypertension    Pneumonia 1960's X 1   Primary skin malignancy with unknown cell type 12/10/2016   Sleep apnea    "couldn't wear the mask" (04/26/2015)   Thalassemia minor    Type II diabetes mellitus ([place])     Patient Active Problem List   Diagnosis Date Noted   Acute on chronic respiratory failure with hypoxia and hypercapnia ([place]) 06/25/2021   CAP (community acquired pneumonia) 06/25/2021   Chronic diastolic CHF (congestive heart failure) ([place]) 06/25/2021   Benign prostatic hyperplasia with urinary obstruction 08/29/2020   Overactive bladder 08/29/2020   Type II diabetes mellitus, uncontrolled 08/29/2020   Impotence 08/29/2020   Urge incontinence of  urine 08/29/2020   Chronic respiratory failure with hypoxia ([place]) 04/06/2019   Dyslipidemia 04/06/2019   Hypomagnesemia 04/06/2019   MDD (major depressive disorder) 04/06/2019   Psychosis ([place]) 04/05/2019   Dyspnea 02/12/2019   Pain in left wrist 30/05/2329   Diastolic dysfunction 07/62/2633   Primary skin malignancy with unknown cell type 35/45/6256   Acute diastolic heart failure ([place]) 08/25/2016   Hypoxia 08/21/2016   Acute respiratory failure with hypoxia and hypercapnia ([facility]) 08/21/2016   Moderate protein-calorie malnutrition ([place]) 08/21/2016   Stasis edema of both lower extremities 08/21/2016   Chronic pain 08/21/2016   Tobacco use disorder 02/23/2016   Venous stasis syndrome 02/09/2016   Spondylosis without myelopathy or radiculopathy, lumbar region 12/13/2015   AKI (acute kidney injury) ([place]) 11/02/2015   Hyperkalemia 11/02/2015   Hypoglycemia 11/02/2015   Cellulitis    Leg ulcer ([place])    Nonhealing ulcer of left lower extremity ([place])    Pain of left leg    Cellulitis of leg 04/25/2015   Degenerative arthritis of hip 04/25/2015   Cellulitis and abscess of leg    Microcytic anemia 03/27/2015   Hypertension    Fever    DM2 (diabetes mellitus, type 2) ([place]) 03/20/2015   MSSA (methicillin susceptible Staphylococcus aureus) infection 03/20/2015   Leukocytosis 03/20/2015   Anemia, iron deficiency 03/20/2015   Cellulitis of left lower extremity  Pyomyositis    Systemic infection ([place]) 03/14/2015    Past Surgical History:  Procedure Laterality Date   INGUINAL HERNIA REPAIR Right ~ 2004   SHOULDER ARTHROSCOPY W/ ROTATOR CUFF REPAIR Right ~ 2004   TOENAIL EXCISION Right 1980's X 2   "big toe"       Family History  Problem Relation Age of Onset   Diabetes Mellitus II Mother    Diabetes Mellitus II Father    Diabetes Mellitus II Brother     Social History   Tobacco Use   Smoking status: Every Day    Packs/day: 0.12    Years: 45.00    Pack years: 5.40     Types: Cigarettes   Smokeless tobacco: Never  Vaping Use   Vaping Use: Never used  Substance Use Topics   Alcohol use: No    Comment: 04/26/2015 "I may drink 1/2 glass of wine or a couple beers 1-2 times/month; if that"   Drug use: No    Comment: "stopped all drug use in 2005; S/P SARP program in [place]"    Home Medications Prior to Admission medications   Medication Sig Start Date End Date Taking? Authorizing Provider  acetaminophen (TYLENOL) 325 MG tablet Take 2 tablets (650 mg total) by mouth every 6 (six) hours as needed for mild pain (or Fever >/= 101). 03/23/15  Yes Robbie Lis, MD  ARIPiprazole (ABILIFY) 2 MG tablet Take 2 mg by mouth at bedtime. 04/27/21  Yes [provider]  aspirin EC 81 MG tablet Take 81 mg by mouth every morning.   Yes [provider]  benzocaine (ORAJEL) 10 % mucosal gel Use as directed 1 application in the mouth or throat every 2 (two) hours as needed for mouth pain (soreness).   Yes [provider]  betamethasone dipropionate 0.05 % lotion Apply 1 application topically every 12 (twelve) hours as needed (itching). 06/07/21  Yes [provider]  chlorhexidine (PERIDEX) 0.12 % solution Use as directed 5 mLs in the mouth or throat 2 (two) times daily.   Yes [provider]  Cholecalciferol (VITAMIN D) 50 MCG (2000 UT) tablet Take 2,000 Units by mouth daily.   Yes [provider]  ferrous sulfate 325 (65 FE) MG tablet Take 325 mg by mouth daily.   Yes [provider]  furosemide (LASIX) 20 MG tablet Take 60 mg by mouth 2 (two) times daily. 05/16/21  Yes [provider]  gabapentin (NEURONTIN) 300 MG capsule Take 300 mg by mouth 3 (three) times daily. 05/16/21  Yes [provider]  Glucagon HCl (GLUCAGON EMERGENCY) 1 MG/ML SOLR Inject 1 mg as directed every 6 (six) hours as needed (low CBG).   Yes [provider]  hydrocortisone (ANUSOL-HC) 2.5 % rectal cream Apply 1 application  topically every 12 (twelve) hours as needed (rash). 02/10/21  Yes [provider]  hydrocortisone (ANUSOL-HC) 2.5 % rectal cream Place 1 application rectally daily as needed for hemorrhoids or anal itching.   Yes [provider]  insulin aspart (NOVOLOG) 100 UNIT/ML injection Before each meal 3 times a day, 140-199 - 2 units, 200-250 - 4 units, 251-299 - 6 units,  300-349 - 8 units,  350 or above 10 units. Dispense syringes and needles as needed, Ok to switch to PEN if approved. Substitute to any brand approved. DX DM2, Code E11.65 Patient taking differently: Inject 14 Units into the skin as directed. Inject 14 units TID & Sliding Scale QID: If 150-200=2 units, 201-250=5  units, 251-300=10 units, 301-350=15 units If > 350=20 units 11/13/15  Yes Thurnell Lose, MD  Insulin Glargine (BASAGLAR KWIKPEN) 100 UNIT/ML Inject 30 Units into the skin 2 (two) times daily. 06/11/21  Yes [provider]  lisinopril (PRINIVIL,ZESTRIL) 2.5 MG tablet Take 1 tablet (2.5 mg total) by mouth daily. 11/13/15  Yes Thurnell Lose, MD  melatonin 3 MG TABS tablet Take 3 mg by mouth at bedtime.   Yes [provider]  METAMUCIL FIBER PO Take 2 Wafers by mouth in the morning and at bedtime.   Yes [provider]  metFORMIN (GLUCOPHAGE) 500 MG tablet Take 500 mg by mouth 2 (two) times daily with a meal. 10/15/17  Yes [provider]  Multiple Vitamin (MULTIVITAMIN WITH MINERALS) TABS tablet Take 1 tablet by mouth daily. 03/23/15  Yes Robbie Lis, MD  nitroGLYCERIN (NITROSTAT) 0.4 MG SL tablet Place 0.4 mg under the tongue every 5 (five) minutes as needed for chest pain.   Yes [provider]  tamsulosin (FLOMAX) 0.4 MG CAPS capsule Take 0.8 mg by mouth at bedtime.   Yes [provider]  tiZANidine (ZANAFLEX) 4 MG tablet Take 4 mg by mouth every 8 (eight) hours as needed for muscle spasms.   Yes [provider]  TRADJENTA 5 MG TABS tablet Take 5 mg  by mouth daily. 05/04/21  Yes [provider]  traZODone (DESYREL) 50 MG tablet Take 25 mg by mouth at bedtime as needed. 06/08/21  Yes [provider]  triamcinolone cream (KENALOG) 0.1 % Apply 1 application topically 2 (two) times daily. 06/18/21  Yes [provider]  VIIBRYD 40 MG TABS Take 40 mg by mouth daily. 05/16/21  Yes [provider]    Allergies    Other and Sulfa antibiotics  Review of Systems   Review of Systems  Constitutional:  Positive for chills and fever.  HENT:  Negative for ear pain and sore throat.   Eyes:  Negative for pain and visual disturbance.  Respiratory:  Positive for cough and shortness of breath.   Cardiovascular:  Negative for chest pain and palpitations.  Gastrointestinal:  Negative for abdominal pain and vomiting.  Genitourinary:  Negative for dysuria and hematuria.  Musculoskeletal:  Negative for arthralgias and back pain.  Skin:  Negative for color change and rash.  Neurological:  Negative for seizures and syncope.  Psychiatric/Behavioral:  Positive for decreased concentration.   All other systems reviewed and are negative.  Physical Exam Updated Vital Signs BP (!) 110/59   Pulse (!) 56   Temp 98.8 F (37.1 C) (Oral)   Resp 19   Ht _0  (1.956 m)   Wt 120.4 kg   SpO2 98%   BMI 31.48 kg/m   Physical Exam Vitals and nursing note reviewed.  Constitutional:      General: He is not in acute distress.    Appearance: He is well-developed. He is not ill-appearing.  HENT:     Head: Normocephalic and atraumatic.  Eyes:     Conjunctiva/sclera: Conjunctivae normal.  Cardiovascular:     Rate and Rhythm: Normal rate and regular rhythm.     Heart sounds: No murmur [place]. Pulmonary:     Effort: Pulmonary effort is normal. No respiratory distress.     Breath sounds: Normal breath sounds.  Abdominal:     Palpations: Abdomen is soft.     Tenderness: There is no abdominal tenderness.  Musculoskeletal:      Cervical back: Neck supple.  Comments: Wrappings around the patient's lower extremities due to chronic venous stasis.   Skin:    General: Skin is warm and dry.  Neurological:     General: No focal deficit present.     Mental Status: He is alert. Mental status is at baseline.    ED Results / Procedures / Treatments   Labs (all labs ordered are listed, but only abnormal results are displayed) Labs Reviewed  MRSA NEXT GEN BY PCR, NASAL - Abnormal; Notable for the following components:      Result Value   MRSA by PCR Next Gen DETECTED (*)    All other components within normal limits  BASIC METABOLIC PANEL - Abnormal; Notable for the following components:   CO2 34 (*)    Glucose, Bld 226 (*)    BUN 30 (*)    Creatinine, Ser 1.32 (*)    Calcium 8.7 (*)    GFR, Estimated 59 (*)    All other components within normal limits  CBC WITH DIFFERENTIAL/PLATELET - Abnormal; Notable for the following components:   WBC 11.5 (*)    Hemoglobin 10.7 (*)    HCT 38.6 (*)    MCV 71.9 (*)    MCH 19.9 (*)    MCHC 27.7 (*)    RDW 21.7 (*)    Neutro Abs 9.6 (*)    All other components within normal limits  HEPATIC FUNCTION PANEL - Abnormal; Notable for the following components:   Albumin 2.9 (*)    All other components within normal limits  CBC - Abnormal; Notable for the following components:   RBC 3.96 (*)    Hemoglobin 8.0 (*)    HCT 29.3 (*)    MCV 74.0 (*)    MCH 20.2 (*)    MCHC 27.3 (*)    RDW 21.3 (*)    All other components within normal limits  COMPREHENSIVE METABOLIC PANEL - Abnormal; Notable for the following components:   Glucose, Bld 223 (*)    BUN 31 (*)    Creatinine, Ser 1.30 (*)    Calcium 8.4 (*)    Albumin 2.9 (*)    All other components within normal limits  IRON AND TIBC - Abnormal; Notable for the following components:   Iron 17 (*)    Saturation Ratios 7 (*)    All other components within normal limits  HEMOGLOBIN A1C - Abnormal; Notable for the following  components:   Hgb A1c MFr Bld 8.4 (*)    All other components within normal limits  BLOOD GAS, ARTERIAL - Abnormal; Notable for the following components:   pH, Arterial 7.293 (*)    pCO2 arterial 71.3 (*)    Bicarbonate 33.4 (*)    Acid-Base Excess 5.5 (*)    All other components within normal limits  BLOOD GAS, ARTERIAL - Abnormal; Notable for the following components:   pH, Arterial 7.339 (*)    pCO2 arterial 68.0 (*)    pO2, Arterial 39.2 (*)    Bicarbonate 35.6 (*)    Acid-Base Excess 8.3 (*)    All other components within normal limits  GLUCOSE, CAPILLARY - Abnormal; Notable for the following components:   Glucose-Capillary 127 (*)    All other components within normal limits  GLUCOSE, CAPILLARY - Abnormal; Notable for the following components:   Glucose-Capillary 129 (*)    All other components within normal limits  CBG MONITORING, ED - Abnormal; Notable for the following components:   Glucose-Capillary 112 (*)  All other components within normal limits  CBG MONITORING, ED - Abnormal; Notable for the following components:   Glucose-Capillary 111 (*)    All other components within normal limits  RESP PANEL BY RT-PCR (FLU A&B, COVID) ARPGX2  RESPIRATORY PANEL BY PCR  BRAIN NATRIURETIC PEPTIDE  HIV ANTIBODY (ROUTINE TESTING W REFLEX)  STREP PNEUMONIAE URINARY ANTIGEN  LACTIC ACID, PLASMA  LEGIONELLA PNEUMOPHILA SEROGP 1 UR AG  TROPONIN I (HIGH SENSITIVITY)    EKG None  Radiology CT Angio Chest PE W and/or Wo Contrast  Result Date: 06/25/2021 CLINICAL DATA:  History of pneumonia 1 month ago with persistent hypoxia EXAM: CT ANGIOGRAPHY CHEST WITH CONTRAST TECHNIQUE: Multidetector CT imaging of the chest was performed using the standard protocol during bolus administration of intravenous contrast. Multiplanar CT image reconstructions and MIPs were obtained to evaluate the vascular anatomy. CONTRAST:  66m OMNIPAQUE IOHEXOL 350 MG/ML SOLN COMPARISON:  Chest x-ray from the  previous day. FINDINGS: Cardiovascular: Atherosclerotic calcifications of the thoracic aorta are noted without aneurysmal dilatation. No cardiac enlargement is seen. Coronary calcifications are noted. The pulmonary artery shows a normal branching pattern. No discrete filling defect is identified to suggest pulmonary embolism. Lower lobe branches are somewhat limited due to underlying atelectatic changes as well as patient motion artifact. Mediastinum/Nodes: Thoracic inlet is within normal limits. No sizable hilar or mediastinal adenopathy is noted. The esophagus as visualized is within normal limits. Lungs/Pleura: Lungs are well aerated bilaterally with the exception of mild bibasilar atelectatic changes. This is slightly greater on the right than the left. No sizable effusion is seen. No sizable parenchymal nodule is noted. Upper Abdomen: Visualized upper abdomen is within normal limits. Musculoskeletal: Degenerative changes of the thoracic spine are noted. No acute bony abnormality is seen. Review of the MIP images confirms the above findings. IMPRESSION: No definitive pulmonary embolism is noted. Lower lobe branches particularly on the right are somewhat limited due to underlying atelectasis and patient motion artifact. Bibasilar atelectatic changes right greater than left. No associated effusion is seen Aortic Atherosclerosis (ICD10-I70.0). Electronically Signed   By: MInez CatalinaM.D.   On: 06/25/2021 01:35   DG Chest Port 1 View  Result Date: 06/24/2021 CLINICAL DATA:  History of pneumonia, tobacco use EXAM: PORTABLE CHEST 1 VIEW COMPARISON:  04/03/2021 FINDINGS: Cardiac shadow is stable. The lungs are well aerated bilaterally. Right basilar atelectatic changes are seen. No focal confluent infiltrate is noted. Mild persistent vascular congestion remains. No bony abnormality is seen. IMPRESSION: Mild vascular congestion and right basilar atelectasis. No other focal abnormality is noted. Electronically  Signed   By: MInez CatalinaM.D.   On: 06/24/2021 21:11    Procedures Procedures   Medications Ordered in ED Medications  gabapentin (NEURONTIN) capsule 300 mg (300 mg Oral Given 06/25/21 0916)  ferrous sulfate tablet 325 mg (325 mg Oral Given 06/25/21 0916)  cholecalciferol (VITAMIN D) tablet 2,000 Units (2,000 Units Oral Given 06/25/21 0916)  chlorhexidine gluconate (MEDLINE KIT) (PERIDEX) 0.12 % solution 5 mL (5 mLs Mouth/Throat Given 06/25/21 0917)  aspirin EC tablet 81 mg (81 mg Oral Given 06/25/21 0916)  ARIPiprazole (ABILIFY) tablet 2 mg (2 mg Oral Given 06/25/21 0118)  insulin aspart (novoLOG) injection 0-15 Units (2 Units Subcutaneous Given 06/25/21 0917)  insulin glargine-yfgn (SEMGLEE) injection 25 Units (25 Units Subcutaneous Given 06/25/21 1114)  traZODone (DESYREL) tablet 25 mg (has no administration in time range)  tiZANidine (ZANAFLEX) tablet 4 mg (has no administration in time range)  melatonin tablet 3 mg (3  mg Oral Given 06/25/21 0118)  acetaminophen (TYLENOL) tablet 650 mg (has no administration in time range)  multivitamin with minerals tablet 1 tablet (1 tablet Oral Given 06/25/21 0916)  Vilazodone HCl TABS 40 mg (40 mg Oral Given 06/25/21 1114)  cefTRIAXone (ROCEPHIN) 2 g in sodium chloride 0.9 % 100 mL IVPB (has no administration in time range)  azithromycin (ZITHROMAX) 500 mg in sodium chloride 0.9 % 250 mL IVPB (has no administration in time range)  enoxaparin (LOVENOX) injection 40 mg (40 mg Subcutaneous Given 06/25/21 0917)  lactated ringers infusion ( Intravenous New Bag/Given 06/25/21 1126)  Chlorhexidine Gluconate Cloth 2 % PADS 6 each (6 each Topical Given 06/25/21 0916)  mupirocin ointment (BACTROBAN) 2 % 1 application (1 application Nasal Given 06/25/21 0917)  Chlorhexidine Gluconate Cloth 2 % PADS 6 each (0 each Topical Duplicate 44/8/18 5631)  MEDLINE mouth rinse (15 mLs Mouth Rinse Given 06/25/21 0916)  sodium hypochlorite (DAKIN'S 1/4 STRENGTH) topical solution (has  no administration in time range)  acetaminophen (TYLENOL) tablet 1,000 mg (1,000 mg Oral Given 06/24/21 2056)  cefTRIAXone (ROCEPHIN) 1 g in sodium chloride 0.9 % 100 mL IVPB (0 g Intravenous Stopped 06/24/21 2239)  azithromycin (ZITHROMAX) 500 mg in sodium chloride 0.9 % 250 mL IVPB (0 mg Intravenous Stopped 06/24/21 2322)  iohexol (OMNIPAQUE) 350 MG/ML injection 75 mL (75 mLs Intravenous Contrast Given 06/25/21 0106)    ED Course  I have reviewed the triage vital signs and the nursing notes.  Pertinent labs & imaging results that were available during my care of the patient were reviewed by me and considered in my medical decision making (see chart for details).    MDM Rules/Calculators/A&P                           65 year old male with a history of pneumonia, OSA, DM2, chronic hypoxemic respiratory failure on 2 L O2 via nasal cannula, presenting to the emergency department with acute hypoxic respiratory failure.  The patient comes via EMS from [facility].  He was recently treated for pneumonia a month ago.  He is on 2 L O2 via nasal cannula at baseline.  Staff at his facility stated that he was around 77% on 2 L and was subsequently increased to 5 L with improvement in his saturations.  He arrives with a fever to 101.1.  He endorses a cough productive of yellow to white sputum over the past couple of days.  He endorses chills.  CBG on arrival was 269.  He is status post 1 g of Tylenol.  On arrival, the patient was febrile, borderline tachycardic with a pulse of 99, hypoxia on his baseline 2 L O2 down to the 80s, subsequently increased back to 5 L.  He was not tachypneic.  He does have a history of OSA and is on CPAP.  He presents with acute hypoxic respiratory failure, fever and chills and a productive cough over the past few days.  Symptoms are consistent with possible pneumonia, PE, COVID-19, other viral infectious etiology. No clear hx of COPD. No clear evidence for volume overload CHF  exacerbation.   CXR without clear bacterial consolidation. CTA PE study ordered to further evaluate. Met SIRS criteria on arrival with HR >90, fever, leukocytosis 11.5. Rocephin and Azithromycin ordered. Given the patient's new acute on chronic hypoxic respiratory failure, hospitalist medicine consulted for admission. Final Clinical Impression(s) / ED Diagnoses Final diagnoses:  Acute on chronic respiratory failure with  hypoxia [facility])    Rx / DC Orders ED Discharge Orders     None        Regan Lemming, MD 06/25/21 1220

## 2021-06-24 NOTE — ED Notes (Signed)
Called and spoke to patients son and gave him update.

## 2021-06-24 NOTE — ED Triage Notes (Signed)
Patient BIB [facility] from [facility]. Patient had pneumonia was treated for it a month ago. Staff said O2 77% on 2L, they bumped him up to 5L and improved to 95%. Patient arrives feeling warm to touch, staff said a fever. Rhonchi in lower lobes. Patient said last time it happened he had pneumonia.    EMS vitals 101 HR 95% 4L 112/61 269 CBG 1000mg  Tylenol 18G left forearm

## 2021-06-25 ENCOUNTER — Encounter (HOSPITAL_COMMUNITY): Payer: Self-pay | Admitting: Internal Medicine

## 2021-06-25 ENCOUNTER — Inpatient Hospital Stay (HOSPITAL_COMMUNITY): Payer: Medicare Other

## 2021-06-25 DIAGNOSIS — J9621 Acute and chronic respiratory failure with hypoxia: Secondary | ICD-10-CM | POA: Diagnosis present

## 2021-06-25 DIAGNOSIS — N138 Other obstructive and reflux uropathy: Secondary | ICD-10-CM | POA: Diagnosis present

## 2021-06-25 DIAGNOSIS — L97919 Non-pressure chronic ulcer of unspecified part of right lower leg with unspecified severity: Secondary | ICD-10-CM | POA: Diagnosis present

## 2021-06-25 DIAGNOSIS — Z794 Long term (current) use of insulin: Secondary | ICD-10-CM

## 2021-06-25 DIAGNOSIS — G4733 Obstructive sleep apnea (adult) (pediatric): Secondary | ICD-10-CM | POA: Diagnosis present

## 2021-06-25 DIAGNOSIS — J189 Pneumonia, unspecified organism: Secondary | ICD-10-CM | POA: Diagnosis present

## 2021-06-25 DIAGNOSIS — Z833 Family history of diabetes mellitus: Secondary | ICD-10-CM | POA: Diagnosis not present

## 2021-06-25 DIAGNOSIS — D649 Anemia, unspecified: Secondary | ICD-10-CM | POA: Diagnosis present

## 2021-06-25 DIAGNOSIS — K219 Gastro-esophageal reflux disease without esophagitis: Secondary | ICD-10-CM | POA: Diagnosis present

## 2021-06-25 DIAGNOSIS — N179 Acute kidney failure, unspecified: Secondary | ICD-10-CM

## 2021-06-25 DIAGNOSIS — I110 Hypertensive heart disease with heart failure: Secondary | ICD-10-CM | POA: Diagnosis present

## 2021-06-25 DIAGNOSIS — E119 Type 2 diabetes mellitus without complications: Secondary | ICD-10-CM

## 2021-06-25 DIAGNOSIS — L309 Dermatitis, unspecified: Secondary | ICD-10-CM | POA: Diagnosis present

## 2021-06-25 DIAGNOSIS — J129 Viral pneumonia, unspecified: Secondary | ICD-10-CM | POA: Diagnosis present

## 2021-06-25 DIAGNOSIS — Z6831 Body mass index (BMI) 31.0-31.9, adult: Secondary | ICD-10-CM | POA: Diagnosis not present

## 2021-06-25 DIAGNOSIS — E1165 Type 2 diabetes mellitus with hyperglycemia: Secondary | ICD-10-CM | POA: Diagnosis present

## 2021-06-25 DIAGNOSIS — D509 Iron deficiency anemia, unspecified: Secondary | ICD-10-CM

## 2021-06-25 DIAGNOSIS — E669 Obesity, unspecified: Secondary | ICD-10-CM | POA: Diagnosis present

## 2021-06-25 DIAGNOSIS — M1990 Unspecified osteoarthritis, unspecified site: Secondary | ICD-10-CM | POA: Diagnosis present

## 2021-06-25 DIAGNOSIS — B349 Viral infection, unspecified: Secondary | ICD-10-CM | POA: Diagnosis present

## 2021-06-25 DIAGNOSIS — I5032 Chronic diastolic (congestive) heart failure: Secondary | ICD-10-CM

## 2021-06-25 DIAGNOSIS — J9622 Acute and chronic respiratory failure with hypercapnia: Secondary | ICD-10-CM | POA: Diagnosis present

## 2021-06-25 DIAGNOSIS — Z87891 Personal history of nicotine dependence: Secondary | ICD-10-CM | POA: Diagnosis not present

## 2021-06-25 DIAGNOSIS — G8929 Other chronic pain: Secondary | ICD-10-CM

## 2021-06-25 DIAGNOSIS — Z20822 Contact with and (suspected) exposure to covid-19: Secondary | ICD-10-CM | POA: Diagnosis present

## 2021-06-25 DIAGNOSIS — L97929 Non-pressure chronic ulcer of unspecified part of left lower leg with unspecified severity: Secondary | ICD-10-CM | POA: Diagnosis present

## 2021-06-25 DIAGNOSIS — F329 Major depressive disorder, single episode, unspecified: Secondary | ICD-10-CM | POA: Diagnosis present

## 2021-06-25 DIAGNOSIS — F32A Depression, unspecified: Secondary | ICD-10-CM | POA: Diagnosis present

## 2021-06-25 HISTORY — DX: Chronic diastolic (congestive) heart failure: I50.32

## 2021-06-25 LAB — CBC
HCT: 29.3 % — ABNORMAL LOW (ref 39.0–52.0)
Hemoglobin: 8 g/dL — ABNORMAL LOW (ref 13.0–17.0)
MCH: 20.2 pg — ABNORMAL LOW (ref 26.0–34.0)
MCHC: 27.3 g/dL — ABNORMAL LOW (ref 30.0–36.0)
MCV: 74 fL — ABNORMAL LOW (ref 80.0–100.0)
Platelets: 171 10*3/uL (ref 150–400)
RBC: 3.96 MIL/uL — ABNORMAL LOW (ref 4.22–5.81)
RDW: 21.3 % — ABNORMAL HIGH (ref 11.5–15.5)
WBC: 9.9 10*3/uL (ref 4.0–10.5)
nRBC: 0 % (ref 0.0–0.2)

## 2021-06-25 LAB — MRSA NEXT GEN BY PCR, NASAL: MRSA by PCR Next Gen: DETECTED — AB

## 2021-06-25 LAB — COMPREHENSIVE METABOLIC PANEL
ALT: 17 U/L (ref 0–44)
AST: 18 U/L (ref 15–41)
Albumin: 2.9 g/dL — ABNORMAL LOW (ref 3.5–5.0)
Alkaline Phosphatase: 82 U/L (ref 38–126)
Anion gap: 9 (ref 5–15)
BUN: 31 mg/dL — ABNORMAL HIGH (ref 8–23)
CO2: 32 mmol/L (ref 22–32)
Calcium: 8.4 mg/dL — ABNORMAL LOW (ref 8.9–10.3)
Chloride: 98 mmol/L (ref 98–111)
Creatinine, Ser: 1.3 mg/dL — ABNORMAL HIGH (ref 0.61–1.24)
GFR, Estimated: >60 mL/min (ref >60)
Glucose, Bld: 223 mg/dL — ABNORMAL HIGH (ref 70–99)
Potassium: 4.6 mmol/L (ref 3.5–5.1)
Sodium: 139 mmol/L (ref 135–145)
Total Bilirubin: 0.5 mg/dL (ref 0.3–1.2)
Total Protein: 7.3 g/dL (ref 6.5–8.1)

## 2021-06-25 LAB — BLOOD GAS, ARTERIAL
Acid-Base Excess: 5.5 mmol/L — ABNORMAL HIGH (ref 0.0–2.0)
Acid-Base Excess: 8.3 mmol/L — ABNORMAL HIGH (ref 0.0–2.0)
Bicarbonate: 33.4 mmol/L — ABNORMAL HIGH (ref 20.0–28.0)
Bicarbonate: 35.6 mmol/L — ABNORMAL HIGH (ref 20.0–28.0)
Drawn by: 25788
FIO2: 21
O2 Saturation: 97 %
O2 Saturation: 66.2 %
Patient temperature: 98.6
Patient temperature: 98.8
pCO2 arterial: 71.3 mmHg (ref 32.0–48.0)
pCO2 arterial: 68 mmHg (ref 32.0–48.0)
pH, Arterial: 7.293 — ABNORMAL LOW (ref 7.350–7.450)
pH, Arterial: 7.339 — ABNORMAL LOW (ref 7.350–7.450)
pO2, Arterial: 103 mmHg (ref 83.0–108.0)
pO2, Arterial: 39.2 mmHg — CL (ref 83.0–108.0)

## 2021-06-25 LAB — IRON AND TIBC
Iron: 17 ug/dL — ABNORMAL LOW (ref 45–182)
Saturation Ratios: 7 % — ABNORMAL LOW (ref 17.9–39.5)
TIBC: 262 ug/dL (ref 250–450)
UIBC: 245 ug/dL

## 2021-06-25 LAB — RESPIRATORY PANEL BY PCR

## 2021-06-25 LAB — GLUCOSE, CAPILLARY
Glucose-Capillary: 127 mg/dL — ABNORMAL HIGH (ref 70–99)
Glucose-Capillary: 129 mg/dL — ABNORMAL HIGH (ref 70–99)
Glucose-Capillary: 236 mg/dL — ABNORMAL HIGH (ref 70–99)
Glucose-Capillary: 216 mg/dL — ABNORMAL HIGH (ref 70–99)

## 2021-06-25 LAB — LACTIC ACID, PLASMA: Lactic Acid, Venous: 0.9 mmol/L (ref 0.5–1.9)

## 2021-06-25 LAB — HIV ANTIBODY (ROUTINE TESTING W REFLEX): HIV Screen 4th Generation wRfx: NONREACTIVE

## 2021-06-25 LAB — CBG MONITORING, ED
Glucose-Capillary: 112 mg/dL — ABNORMAL HIGH (ref 70–99)
Glucose-Capillary: 111 mg/dL — ABNORMAL HIGH (ref 70–99)

## 2021-06-25 LAB — HEMOGLOBIN A1C
Hgb A1c MFr Bld: 8.4 % — ABNORMAL HIGH (ref 4.8–5.6)
Mean Plasma Glucose: 194.38 mg/dL

## 2021-06-25 LAB — STREP PNEUMONIAE URINARY ANTIGEN: Strep Pneumo Urinary Antigen: NEGATIVE

## 2021-06-25 MED ORDER — LACTATED RINGERS IV SOLN: INTRAVENOUS | Status: DC

## 2021-06-25 MED ORDER — ASPIRIN EC 81 MG PO TBEC
81.0000 mg | DELAYED_RELEASE_TABLET | Freq: Every day | ORAL | Status: DC
  Administered 2021-06-25 – 2021-06-29 (×5): 81 mg via ORAL
  Filled 2021-06-25 (×5): qty 1

## 2021-06-25 MED ORDER — VILAZODONE HCL 20 MG PO TABS
40.0000 mg | ORAL_TABLET | Freq: Every day | ORAL | Status: DC
  Administered 2021-06-25 – 2021-06-29 (×5): 40 mg via ORAL
  Filled 2021-06-25 (×7): qty 2

## 2021-06-25 MED ORDER — IOHEXOL 350 MG/ML SOLN
75.0000 mL | Freq: Once | INTRAVENOUS | Status: AC | PRN
  Administered 2021-06-25: 75 mL via INTRAVENOUS

## 2021-06-25 MED ORDER — TRAZODONE HCL 50 MG PO TABS: 25.0000 mg | ORAL_TABLET | Freq: Every evening | ORAL | Status: DC | PRN

## 2021-06-25 MED ORDER — VILAZODONE HCL 40 MG PO TABS: 40.0000 mg | ORAL_TABLET | Freq: Every day | ORAL | Status: DC

## 2021-06-25 MED ORDER — SODIUM CHLORIDE 0.9 % IV SOLN
500.0000 mg | INTRAVENOUS | Status: DC
  Administered 2021-06-25: 500 mg via INTRAVENOUS
  Filled 2021-06-25: qty 500

## 2021-06-25 MED ORDER — VITAMIN D3 25 MCG (1000 UNIT) PO TABS
2000.0000 [IU] | ORAL_TABLET | Freq: Every day | ORAL | Status: DC
  Administered 2021-06-25 – 2021-06-29 (×5): 2000 [IU] via ORAL
  Filled 2021-06-25 (×6): qty 2

## 2021-06-25 MED ORDER — MUPIROCIN 2 % EX OINT
1.0000 "application " | TOPICAL_OINTMENT | Freq: Two times a day (BID) | CUTANEOUS | Status: AC
  Administered 2021-06-25 – 2021-06-29 (×10): 1 via NASAL
  Filled 2021-06-25 (×4): qty 22

## 2021-06-25 MED ORDER — SODIUM CHLORIDE 0.9 % IV SOLN
2.0000 g | INTRAVENOUS | Status: DC
  Administered 2021-06-25: 2 g via INTRAVENOUS
  Filled 2021-06-25: qty 20

## 2021-06-25 MED ORDER — ACETAMINOPHEN 325 MG PO TABS
650.0000 mg | ORAL_TABLET | Freq: Four times a day (QID) | ORAL | Status: DC | PRN
  Administered 2021-06-26 – 2021-06-29 (×4): 650 mg via ORAL
  Filled 2021-06-25 (×5): qty 2

## 2021-06-25 MED ORDER — CHLORHEXIDINE GLUCONATE CLOTH 2 % EX PADS
6.0000 | MEDICATED_PAD | Freq: Every day | CUTANEOUS | Status: DC
  Administered 2021-06-25 – 2021-06-29 (×4): 6 via TOPICAL

## 2021-06-25 MED ORDER — CHLORHEXIDINE GLUCONATE CLOTH 2 % EX PADS
6.0000 | MEDICATED_PAD | Freq: Every day | CUTANEOUS | Status: AC
  Administered 2021-06-27 – 2021-06-29 (×3): 6 via TOPICAL

## 2021-06-25 MED ORDER — TIZANIDINE HCL 4 MG PO TABS
4.0000 mg | ORAL_TABLET | Freq: Three times a day (TID) | ORAL | Status: DC | PRN
  Administered 2021-06-28 – 2021-06-29 (×2): 4 mg via ORAL
  Filled 2021-06-25 (×2): qty 1

## 2021-06-25 MED ORDER — ENOXAPARIN SODIUM 40 MG/0.4ML IJ SOSY: 40.0000 mg | PREFILLED_SYRINGE | INTRAMUSCULAR | Status: DC

## 2021-06-25 MED ORDER — ORAL CARE MOUTH RINSE
15.0000 mL | Freq: Two times a day (BID) | OROMUCOSAL | Status: DC
  Administered 2021-06-25 – 2021-06-29 (×9): 15 mL via OROMUCOSAL

## 2021-06-25 MED ORDER — ADULT MULTIVITAMIN W/MINERALS CH
1.0000 | ORAL_TABLET | Freq: Every day | ORAL | Status: DC
  Administered 2021-06-25 – 2021-06-29 (×5): 1 via ORAL
  Filled 2021-06-25 (×5): qty 1

## 2021-06-25 MED ORDER — LISINOPRIL 2.5 MG PO TABS: 2.5000 mg | ORAL_TABLET | Freq: Every day | ORAL | Status: DC

## 2021-06-25 MED ORDER — ARIPIPRAZOLE 2 MG PO TABS
2.0000 mg | ORAL_TABLET | Freq: Every day | ORAL | Status: DC
  Administered 2021-06-25 – 2021-06-29 (×6): 2 mg via ORAL
  Filled 2021-06-25 (×7): qty 1

## 2021-06-25 MED ORDER — CHLORHEXIDINE GLUCONATE 0.12% ORAL RINSE (MEDLINE KIT)
5.0000 mL | Freq: Two times a day (BID) | OROMUCOSAL | Status: DC
  Administered 2021-06-25 – 2021-06-29 (×6): 5 mL via OROMUCOSAL

## 2021-06-25 MED ORDER — ENOXAPARIN SODIUM 40 MG/0.4ML IJ SOSY
40.0000 mg | PREFILLED_SYRINGE | INTRAMUSCULAR | Status: DC
  Administered 2021-06-25 – 2021-06-29 (×5): 40 mg via SUBCUTANEOUS
  Filled 2021-06-25 (×5): qty 0.4

## 2021-06-25 MED ORDER — MELATONIN 3 MG PO TABS
3.0000 mg | ORAL_TABLET | Freq: Every day | ORAL | Status: DC
  Administered 2021-06-25 – 2021-06-29 (×6): 3 mg via ORAL
  Filled 2021-06-25 (×6): qty 1

## 2021-06-25 MED ORDER — INSULIN GLARGINE-YFGN 100 UNIT/ML ~~LOC~~ SOLN
25.0000 [IU] | Freq: Two times a day (BID) | SUBCUTANEOUS | Status: DC
  Administered 2021-06-25 – 2021-06-29 (×11): 25 [IU] via SUBCUTANEOUS
  Filled 2021-06-25 (×13): qty 0.25

## 2021-06-25 MED ORDER — INSULIN ASPART 100 UNIT/ML IJ SOLN
0.0000 [IU] | Freq: Three times a day (TID) | INTRAMUSCULAR | Status: DC
  Administered 2021-06-25 (×2): 2 [IU] via SUBCUTANEOUS
  Administered 2021-06-25: 5 [IU] via SUBCUTANEOUS
  Administered 2021-06-26 – 2021-06-28 (×8): 3 [IU] via SUBCUTANEOUS
  Administered 2021-06-29: 5 [IU] via SUBCUTANEOUS
  Administered 2021-06-29 (×2): 3 [IU] via SUBCUTANEOUS
  Filled 2021-06-25: qty 0.15

## 2021-06-25 MED ORDER — TAMSULOSIN HCL 0.4 MG PO CAPS: 0.8000 mg | ORAL_CAPSULE | Freq: Every day | ORAL | Status: DC

## 2021-06-25 MED ORDER — FERROUS SULFATE 325 (65 FE) MG PO TABS
325.0000 mg | ORAL_TABLET | Freq: Every day | ORAL | Status: DC
  Administered 2021-06-25 – 2021-06-29 (×5): 325 mg via ORAL
  Filled 2021-06-25 (×5): qty 1

## 2021-06-25 MED ORDER — GABAPENTIN 300 MG PO CAPS
300.0000 mg | ORAL_CAPSULE | Freq: Three times a day (TID) | ORAL | Status: DC
  Administered 2021-06-25 – 2021-06-29 (×15): 300 mg via ORAL
  Filled 2021-06-25 (×15): qty 1

## 2021-06-25 MED ORDER — CHLORHEXIDINE GLUCONATE 0.12 % MT SOLN
OROMUCOSAL | Status: AC
  Administered 2021-06-25: 5 mL via OROMUCOSAL
  Filled 2021-06-25: qty 15

## 2021-06-25 MED ORDER — DAKINS (1/4 STRENGTH) 0.125 % EX SOLN
Freq: Every day | CUTANEOUS | Status: DC
  Filled 2021-06-25 (×2): qty 473

## 2021-06-25 NOTE — Progress Notes (Signed)
Chaplain engaged in an initial visit with [name].  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  [name] also talked about his time in the [employer].  He served for 11 years with two of his tours in [place].  He valued being able to travel to different places out of the country and within the [place].  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  [name] also talked about the origin of his name which is from a famous actor who was popular during the time of his birth.  Chaplain is working to find a Air cabin crew Witness contact to support [name].  Chaplain is also going to take [name] a Bible as requested.  Chaplain will follow-up.    06/25/21 1200  Clinical Encounter Type  Visited With Patient  Visit Type Initial

## 2021-06-25 NOTE — Consult Note (Signed)
WOC Nurse Consult Note: Reason for Consult: LE wraps Patient from SNF, has compression wraps in place. Self reports he has dressing changed M/W/F. He also reports LE wounds on the left and right legs.  Has had leg wound for over 7 years. Is not followed by wound care center currently, only management is per SNF staff.   Wound type: Venous ulcerations right medial malleolus; 2cm x 1cm x 0.1cm; 100% pink, clean; minimal serosanguinous drainage; no odor Left posterior calf: extensive over the entire calf; generalized measurement 20cm x 20cmx 0.2cm; HEAVY; PURULENT drainage from the LLE wounds Pressure Injury POA: NA Measurement: see above  Wound bed:see above  Drainage (amount, consistency, odor) see above  Periwound: venous dermatitis and skin changes  Cleanse LLE wounds with 1/4% Dakins solution, pat dry. Use Drawtex (hydroconductive dressing) to main portions on the posterior left calf; silver hydrofiber on remaining areas.  Silver hydrofiber to the right medial malleolus Top all with ABD pads and orthopedic tech to assist with placement of Unna's boots bilaterally after topical care provided by the bedside RN.   Unfortunately compression will need to be changed more frequently due to the amounts of drainage.  Change M/W/F  Would recommend follow up in a wound care center for more aggressive treatment regimen.  LLE is significantly worse Discussed POC with patient and bedside nurse.  Re consult if needed, will not follow at this time. Thanks  Aleeyah Bensen [employer], RN,CWOCN, CNS, [place] [phone])

## 2021-06-25 NOTE — Progress Notes (Signed)
Orthopedic Tech Progress Note Patient Details:  [name] 1956-07-22 [id number]  Ortho Devices Type of Ortho Device: Ace wrap, Unna boot Ortho Device/Splint Location: Bilateral unna boots Ortho Device/Splint Interventions: Application   Post Interventions Patient Tolerated: Well Instructions Provided: Care of device  [name] 06/25/2021, 2:49 PM

## 2021-06-25 NOTE — H&P (Addendum)
History and Physical    [name] RFF:[id number] DOB: [birth year] DOA: 06/24/2021  PCP: Charlsie Merles, MD  Patient coming from: SNF  I have personally briefly reviewed patient's old medical records in [place]  Chief Complaint: Increased O2 requirement  HPI: [name] is a 65 y.o. male with medical history significant of dCHF, HTN, DM, chronic hypoxic and hypercapnic resp failure on 2L O2 at baseline.  Pt presents to ED from his SNF.  Pt treated for PNA x1 month ago.  Today pt with O2 at 77% on 2L, increased to 5L and satting 95%.  SNF staff said pt had fever and rhonchi in BLL.  Pt said this is similar to last PNA.  Pt denies leg pain, CP.  Pt quite somnolent.   ED Course: Pt with SIRS, febrile, has mild AKI, increased O2 requirement as noted above.  Being treated empirically for PNA.  CXR neg, EDP ordered CTA chest.   Review of Systems: As per HPI, otherwise all review of systems negative.  Past Medical History:  Diagnosis Date   Anemia    Arthritis    "left hip" (04/26/2015)   Cellulitis and abscess of leg hositalized 04/25/2015   left   Chronic diastolic CHF (congestive heart failure) ([place]) 06/25/2021   Chronic hip pain    Depression    GERD (gastroesophageal reflux disease)    Hypercholesterolemia    Hypertension    Pneumonia 1960's X 1   Primary skin malignancy with unknown cell type 12/10/2016   Sleep apnea    "couldn't wear the mask" (04/26/2015)   Thalassemia minor    Type II diabetes mellitus ([place])     Past Surgical History:  Procedure Laterality Date   INGUINAL HERNIA REPAIR Right ~ 2004   SHOULDER ARTHROSCOPY W/ ROTATOR CUFF REPAIR Right ~ 2004   TOENAIL EXCISION Right 1980's X 2   "big toe"     reports that he has been smoking cigarettes. He has a 5.40 pack-year smoking history. He has never used smokeless tobacco. He reports that he does not drink alcohol and does not use drugs.  Allergies  Allergen Reactions   Other     Pt is a  [employer] Witness. No blood products.   Sulfa Antibiotics     Causes flu-like symptom : sweating, chills, fever, body aches    Family History  Problem Relation Age of Onset   Diabetes Mellitus II Mother    Diabetes Mellitus II Father    Diabetes Mellitus II Brother      Prior to Admission medications   Medication Sig Start Date End Date Taking? Authorizing Provider  acetaminophen (TYLENOL) 325 MG tablet Take 2 tablets (650 mg total) by mouth every 6 (six) hours as needed for mild pain (or Fever >/= 101). 03/23/15  Yes Robbie Lis, MD  ARIPiprazole (ABILIFY) 2 MG tablet Take 2 mg by mouth at bedtime. 04/27/21  Yes [provider]  aspirin EC 81 MG tablet Take 81 mg by mouth every morning.   Yes [provider]  benzocaine (ORAJEL) 10 % mucosal gel Use as directed 1 application in the mouth or throat every 2 (two) hours as needed for mouth pain (soreness).   Yes [provider]  betamethasone dipropionate 0.05 % lotion Apply 1 application topically every 12 (twelve) hours as needed (itching). 06/07/21  Yes [provider]  chlorhexidine (PERIDEX) 0.12 % solution Use as directed 5 mLs in the mouth or throat 2 (two) times daily.  Yes [provider]  Cholecalciferol (VITAMIN D) 50 MCG (2000 UT) tablet Take 2,000 Units by mouth daily.   Yes [provider]  ferrous sulfate 325 (65 FE) MG tablet Take 325 mg by mouth daily.   Yes [provider]  furosemide (LASIX) 20 MG tablet Take 60 mg by mouth 2 (two) times daily. 05/16/21  Yes [provider]  gabapentin (NEURONTIN) 300 MG capsule Take 300 mg by mouth 3 (three) times daily. 05/16/21  Yes [provider]  Glucagon HCl (GLUCAGON EMERGENCY) 1 MG/ML SOLR Inject 1 mg as directed every 6 (six) hours as needed (low CBG).   Yes [provider]  hydrocortisone (ANUSOL-HC) 2.5 % rectal cream Apply 1 application topically every 12 (twelve) hours as needed (rash).  02/10/21  Yes [provider]  hydrocortisone (ANUSOL-HC) 2.5 % rectal cream Place 1 application rectally daily as needed for hemorrhoids or anal itching.   Yes [provider]  insulin aspart (NOVOLOG) 100 UNIT/ML injection Before each meal 3 times a day, 140-199 - 2 units, 200-250 - 4 units, 251-299 - 6 units,  300-349 - 8 units,  350 or above 10 units. Dispense syringes and needles as needed, Ok to switch to PEN if approved. Substitute to any brand approved. DX DM2, Code E11.65 Patient taking differently: Inject 14 Units into the skin as directed. Inject 14 units TID & Sliding Scale QID: If 150-200=2 units, 201-250=5 units, 251-300=10 units, 301-350=15 units If > 350=20 units 11/13/15  Yes Thurnell Lose, MD  Insulin Glargine (BASAGLAR KWIKPEN) 100 UNIT/ML Inject 30 Units into the skin 2 (two) times daily. 06/11/21  Yes [provider]  lisinopril (PRINIVIL,ZESTRIL) 2.5 MG tablet Take 1 tablet (2.5 mg total) by mouth daily. 11/13/15  Yes Thurnell Lose, MD  melatonin 3 MG TABS tablet Take 3 mg by mouth at bedtime.   Yes [provider]  METAMUCIL FIBER PO Take 2 Wafers by mouth in the morning and at bedtime.   Yes [provider]  metFORMIN (GLUCOPHAGE) 500 MG tablet Take 500 mg by mouth 2 (two) times daily with a meal. 10/15/17  Yes [provider]  Multiple Vitamin (MULTIVITAMIN WITH MINERALS) TABS tablet Take 1 tablet by mouth daily. 03/23/15  Yes Robbie Lis, MD  nitroGLYCERIN (NITROSTAT) 0.4 MG SL tablet Place 0.4 mg under the tongue every 5 (five) minutes as needed for chest pain.   Yes [provider]  tamsulosin (FLOMAX) 0.4 MG CAPS capsule Take 0.8 mg by mouth at bedtime.   Yes [provider]  tiZANidine (ZANAFLEX) 4 MG tablet Take 4 mg by mouth every 8 (eight) hours as needed for muscle spasms.   Yes [provider]  TRADJENTA 5 MG TABS tablet Take 5 mg by mouth daily. 05/04/21  Yes [provider]   traZODone (DESYREL) 50 MG tablet Take 25 mg by mouth at bedtime as needed. 06/08/21  Yes [provider]  triamcinolone cream (KENALOG) 0.1 % Apply 1 application topically 2 (two) times daily. 06/18/21  Yes [provider]  VIIBRYD 40 MG TABS Take 40 mg by mouth daily. 05/16/21  Yes [provider]    Physical Exam: Vitals:   06/24/21 2300 06/24/21 2330 06/25/21 0000 06/25/21 0030  BP: (!) 101/59 (!) 111/55 96/60 (!) 99/56  Pulse: 81 76 73 67  Resp: 17 18 15 16   Temp:      TempSrc:      SpO2: 98% 98% 98% 99%  Weight:  Height:        Constitutional: NAD, calm, comfortable Eyes: PERRL, lids and conjunctivae normal ENMT: Mucous membranes are moist. Posterior pharynx clear of any exudate or lesions.Normal dentition.  Neck: normal, supple, no masses, no thyromegaly Respiratory: B crackles Cardiovascular: Regular rate and rhythm, no murmurs / rubs / gallops. No extremity edema. 2+ pedal pulses. No carotid bruits.  Abdomen: no tenderness, no masses palpated. No hepatosplenomegaly. Bowel sounds positive.  Musculoskeletal: no clubbing / cyanosis. No joint deformity upper and lower extremities. Good ROM, no contractures. Normal muscle tone.  Skin: Venous stasis skin changes / ulcers of BLE.  Wrinkling of skin. Neurologic: CN 2-12 grossly intact. Sensation intact, DTR normal. Strength 5/5 in all 4.  Psychiatric: Somnolent, wakes up, answers questions, but rapidly falls back asleep   Labs on Admission: I have personally reviewed following labs and imaging studies  CBC: Recent Labs  Lab 06/24/21 2043  WBC 11.5*  NEUTROABS 9.6*  HGB 10.7*  HCT 38.6*  MCV 71.9*  PLT 284   Basic Metabolic Panel: Recent Labs  Lab 06/24/21 2043  NA 141  K 4.6  CL 101  CO2 34*  GLUCOSE 226*  BUN 30*  CREATININE 1.32*  CALCIUM 8.7*   GFR: Estimated Creatinine Clearance: 79.7 mL/min (A) (by C-G formula based on SCr of 1.32 mg/dL (H)). Liver Function Tests: Recent  Labs  Lab 06/24/21 2043  AST 18  ALT 17  ALKPHOS 83  BILITOT 0.5  PROT 7.3  ALBUMIN 2.9*   No results for input(s): LIPASE, AMYLASE in the last 168 hours. No results for input(s): AMMONIA in the last 168 hours. Coagulation Profile: No results for input(s): INR, PROTIME in the last 168 hours. Cardiac Enzymes: No results for input(s): CKTOTAL, CKMB, CKMBINDEX, TROPONINI in the last 168 hours. BNP (last 3 results) No results for input(s): PROBNP in the last 8760 hours. HbA1C: No results for input(s): HGBA1C in the last 72 hours. CBG: No results for input(s): GLUCAP in the last 168 hours. Lipid Profile: No results for input(s): CHOL, HDL, LDLCALC, TRIG, CHOLHDL, LDLDIRECT in the last 72 hours. Thyroid Function Tests: No results for input(s): TSH, T4TOTAL, FREET4, T3FREE, THYROIDAB in the last 72 hours. Anemia Panel: No results for input(s): VITAMINB12, FOLATE, FERRITIN, TIBC, IRON, RETICCTPCT in the last 72 hours. Urine analysis:    Component Value Date/Time   COLORURINE YELLOW 11/02/2015 2258   APPEARANCEUR CLEAR 11/02/2015 2258   LABSPEC 1.021 11/02/2015 2258   PHURINE 5.0 11/02/2015 2258   GLUCOSEU NEGATIVE 11/02/2015 2258   HGBUR NEGATIVE 11/02/2015 2258   BILIRUBINUR NEGATIVE 11/02/2015 2258   KETONESUR NEGATIVE 11/02/2015 2258   PROTEINUR NEGATIVE 11/02/2015 2258   UROBILINOGEN 0.2 03/26/2015 0144   NITRITE NEGATIVE 11/02/2015 2258   LEUKOCYTESUR SMALL (A) 11/02/2015 2258    Radiological Exams on Admission: DG Chest Port 1 View  Result Date: 06/24/2021 CLINICAL DATA:  History of pneumonia, tobacco use EXAM: PORTABLE CHEST 1 VIEW COMPARISON:  04/03/2021 FINDINGS: Cardiac shadow is stable. The lungs are well aerated bilaterally. Right basilar atelectatic changes are seen. No focal confluent infiltrate is noted. Mild persistent vascular congestion remains. No bony abnormality is seen. IMPRESSION: Mild vascular congestion and right basilar atelectasis. No other focal  abnormality is noted. Electronically Signed   By: Inez Catalina M.D.   On: 06/24/2021 21:11    EKG: Independently reviewed.  Assessment/Plan Principal Problem:   Acute on chronic respiratory failure with hypoxia and hypercapnia (HCC) Active Problems:   DM2 (diabetes mellitus, type  2) ([place])   Anemia, iron deficiency   AKI (acute kidney injury) ([place])   Chronic pain   CAP (community acquired pneumonia)   Chronic diastolic CHF (congestive heart failure) (HCC)    Acute on chronic respiratory failure with hypoxia and hypercapnia - Suspect PNA given fever Acute hypoxic component Unclear if acute hypercapnic component but clinically suspect it. ABG pending May need BIPAP depending on results Cont pulse ox CAP - PNA pathway Rocephin + azithro CTA chest pending Tele monitor RVP BCx AKI - Appears pre-renal Hold lasix Gentle IVF for hydration D CHF - Not in exacerbation: nl BNP, pre-renal AKI, wrinkling of skin -> suggests if anything pt is dry. Holding lasix and gently hydrating Hold lisinopril given soft BPs, AKI DM2 - Hold PO meds Lantus 25u BID Mod scale SSI AC Iron def anemia - Stable, unchanged Checking iron levels, may need to increase PO iron dosing Chronic pain - Cont neurontin  DVT prophylaxis: Lovenox Code Status: Full Family Communication: No family in room Disposition Plan: SNF after admit Consults called: None Admission status: Admit to inpatient    Sofiah Lyne, [place] [facility]  How to contact the [facility] Attending or Consulting provider [place] or covering provider during after hours [place], for this patient?  Check the care team in [facility] and look for a) attending/consulting [facility] provider listed and b) the [facility] team listed Log into [email or web address]  Amion Physician Scheduling and messaging for groups and whole hospitals  On call and physician scheduling software for group practices, residents, hospitalists and other medical providers for call, clinic,  rotation and shift schedules. On[employer] is a hospital-wide system for scheduling doctors and paging doctors on call. EasyPlot is for scientific plotting and data analysis.  [email or web address]  and use [place] universal password to access. If you do not have the password, please contact the hospital operator.  Locate the [facility] provider you are looking for under [facility] and page to a number that you can be directly reached. If you still have difficulty reaching the provider, please page the [facility] (Director on Call) for the Hospitalists listed on amion for assistance.  06/25/2021, 12:53 AM

## 2021-06-25 NOTE — ED Notes (Signed)
Patient fell asleep while holding the urinal. Condom catheter placed on patient.

## 2021-06-25 NOTE — Progress Notes (Signed)
Chaplain was able to provide Demetreus with a Bible and find a Jehovah Witness ministry contact to provide him support.  A spiritual care leader within his respected religion should be coming between today or tomorrow.  Chaplain will follow-up.     06/25/21 1400  Clinical Encounter Type  Visited With Patient  Visit Type Follow-up

## 2021-06-25 NOTE — Progress Notes (Signed)
[facility] PROGRESS NOTE   [name] OBS:[id number] DOB: 07-Aug-1956 DOA: 06/24/2021  PCP: Charlsie Merles, MD  Brief History/Interval Summary: 65 y.o. male with medical history significant of dCHF, HTN, DM, chronic hypoxic and hypercapnic resp failure on 2L O2 at baseline.  Patient reports that he is a former smoker.  Patient living in a skilled nursing facility.  Patient was treated for pneumonia about a month ago.  Complained of shortness of breath.  Was found to have saturations in the 70s on 2 L at the skilled nursing facility.  Patient was brought into the hospital.  He was noted to be hypercapnic.  He was placed on BiPAP.  Consultants: None at this time  Procedures: None    Subjective/Interval History: Patient mentions that his breathing has improved slightly.  Continues to have some difficulty breathing at times.  Occasional cough.  No chest pain.  No nausea vomiting.      Assessment/Plan:  Acute on chronic respiratory failure with hypoxia and hypercapnia ABG showed evidence for hypercapnic respiratory failure.  He was hypoxic in the 70s on 2 L.  Placed on BiPAP.  Seems to have improved this morning.  We will repeat ABG.  Taken off of BiPAP to see how he does.  He will be monitored in the stepdown unit.  Community-acquired pneumonia No obvious infiltrate noted on imaging studies.  CT scan did raise concern for basilar atelectasis.  No other source of infection is found.  COVID-19 PCR and influenza PCR is negative.  Respiratory viral panel is unremarkable.  Continue with azithromycin and ceftriaxone for now.  Follow-up on cultures.  Check procalcitonin levels.  Acute kidney injury At baseline creatinine appears to be normal at 0.8-1.0.  Came in with BUN of 30 and creatinine of 1.32.  Furosemide placed on hold.  He was hydrated.  We will recheck labs tomorrow.  Cut back on IV fluid rate.  Monitor urine output.  Avoid nephrotoxic agents.  Chronic diastolic CHF No  evidence for CHF exacerbation currently.  Holding diuretics as he appears to be on the dry side.  Holding lisinopril due to elevated creatinine.  Continue to monitor daily weights.  Strict ins and outs.  Diabetes mellitus type 2, uncontrolled with hyperglycemia HbA1c 8.4.  Monitor CBGs.  Noted to be on Lantus which is being continued.  On SSI.  Iron deficiency anemia Drop in hemoglobin is likely dilutional.  No overt bleeding noted.  Recheck labs tomorrow.  Continue iron tablets.  History of depression and other unspecified psychiatric illness Noted to be on Abilify as well as the lurasidone.  These have been continued.  Chronic pain Continue Neurontin.  Bilateral lower extremity ulcers Both legs are covered in [employer].  He tells me that this is changed to 3 times a week.  We will request wound care RN to evaluate.  Obesity Estimated body mass index is 31.48 kg/m as calculated from the following:   Height as of this encounter: 6' 5"  (1.956 m).   Weight as of this encounter: 120.4 kg.    DVT Prophylaxis: Lovenox Code Status: Full code Family Communication: Discussed with the patient.  No family at bedside Disposition Plan: Hopefully return to SNF in 2 to 3 days.  PT and OT evaluation when stable.  Status is: Inpatient  Remains inpatient appropriate because:IV treatments appropriate due to intensity of illness or inability to take PO and Inpatient level of care appropriate due to severity of illness  Dispo: The patient is  from: SNF              Anticipated d/c is to: SNF              Patient currently is not medically stable to d/c.   Difficult to place patient No      Medications: Scheduled:  ARIPiprazole  2 mg Oral QHS   aspirin EC  81 mg Oral Daily   chlorhexidine gluconate (MEDLINE KIT)  5 mL Mouth/Throat BID   Chlorhexidine Gluconate Cloth  6 each Topical Daily   Chlorhexidine Gluconate Cloth  6 each Topical Q0600   cholecalciferol  2,000 Units Oral Daily    enoxaparin (LOVENOX) injection  40 mg Subcutaneous Q24H   ferrous sulfate  325 mg Oral Q breakfast   gabapentin  300 mg Oral TID   insulin aspart  0-15 Units Subcutaneous TID WC   insulin glargine-yfgn  25 Units Subcutaneous BID   mouth rinse  15 mL Mouth Rinse BID   melatonin  3 mg Oral QHS   multivitamin with minerals  1 tablet Oral Daily   mupirocin ointment  1 application Nasal BID   Vilazodone HCl  40 mg Oral Daily   Continuous:  azithromycin     cefTRIAXone (ROCEPHIN)  IV     lactated ringers 125 mL/hr at 06/25/21 0125   HXT:[id number], tiZANidine, traZODone  Antibiotics: Anti-infectives (From admission, onward)    Start     Dose/Rate Route Frequency Ordered Stop   06/25/21 2200  cefTRIAXone (ROCEPHIN) 2 g in sodium chloride 0.9 % 100 mL IVPB        2 g 200 mL/hr over 30 Minutes Intravenous Every 24 hours 06/25/21 0049 06/30/21 2159   06/25/21 2200  azithromycin (ZITHROMAX) 500 mg in sodium chloride 0.9 % 250 mL IVPB        500 mg 250 mL/hr over 60 Minutes Intravenous Every 24 hours 06/25/21 0049 06/30/21 2159   06/24/21 2145  cefTRIAXone (ROCEPHIN) 1 g in sodium chloride 0.9 % 100 mL IVPB        1 g 200 mL/hr over 30 Minutes Intravenous  Once 06/24/21 2144 06/24/21 2239   06/24/21 2145  azithromycin (ZITHROMAX) 500 mg in sodium chloride 0.9 % 250 mL IVPB        500 mg 250 mL/hr over 60 Minutes Intravenous  Once 06/24/21 2144 06/24/21 2322       Objective:  Vital Signs  Vitals:   06/25/21 0715 06/25/21 0737 06/25/21 0747 06/25/21 0900  BP:   121/79   Pulse: 63  64   Resp:   16   Temp:    98.8 F (37.1 C)  TempSrc:    Oral  SpO2: 99% 99% 100%   Weight:    120.4 kg  Height:    6' 5"  (1.956 m)    Intake/Output Summary (Last 24 hours) at 06/25/2021 0923 Last data filed at 06/24/2021 2322 Gross per 24 hour  Intake 350.09 ml  Output --  Net 350.09 ml   Filed Weights   06/24/21 2037 06/25/21 0900  Weight: 125.6 kg 120.4 kg    General appearance:  Awake alert.  In no distress Resp: Mildly tachypneic.  No use of accessory muscles.  Diminished air entry at the bases.  No wheezing or rhonchi. Cardio: S1-S2 is normal regular.  No S3-S4.  No rubs murmurs or bruit GI: Abdomen is soft.  Nontender nondistended.  Bowel sounds are present normal.  No masses organomegaly Extremities: Both legs covered in  Unna boot Neurologic: Alert and oriented x3.  No focal neurological deficits.    Lab Results:  Data Reviewed: I have personally reviewed following labs and imaging studies  CBC: Recent Labs  Lab 06/24/21 2043 06/25/21 0110  WBC 11.5* 9.9  NEUTROABS 9.6*  --   HGB 10.7* 8.0*  HCT 38.6* 29.3*  MCV 71.9* 74.0*  PLT 235 024    Basic Metabolic Panel: Recent Labs  Lab 06/24/21 2043 06/25/21 0110  NA 141 139  K 4.6 4.6  CL 101 98  CO2 34* 32  GLUCOSE 226* 223*  BUN 30* 31*  CREATININE 1.32* 1.30*  CALCIUM 8.7* 8.4*    GFR: Estimated Creatinine Clearance: 79.2 mL/min (A) (by C-G formula based on SCr of 1.3 mg/dL (H)).  Liver Function Tests: Recent Labs  Lab 06/24/21 2043 06/25/21 0110  AST 18 18  ALT 17 17  ALKPHOS 83 82  BILITOT 0.5 0.5  PROT 7.3 7.3  ALBUMIN 2.9* 2.9*     HbA1C: Recent Labs    06/25/21 0110  HGBA1C 8.4*    CBG: Recent Labs  Lab 06/25/21 0116 06/25/21 0745 06/25/21 0858  GLUCAP 112* 111* 127*    Anemia Panel: Recent Labs    06/25/21 0110  TIBC 262  IRON 17*    Recent Results (from the past 240 hour(s))  Resp Panel by RT-PCR (Flu A&B, Covid) Nasopharyngeal Swab     Status: None   Collection Time: 06/24/21  8:45 PM   Specimen: Nasopharyngeal Swab; Nasopharyngeal(NP) swabs in vial transport medium  Result Value Ref Range Status   SARS Coronavirus 2 by RT PCR NEGATIVE NEGATIVE Final    Comment: (NOTE) SARS-CoV-2 target nucleic acids are NOT DETECTED.  The SARS-CoV-2 RNA is generally detectable in upper respiratory specimens during the acute phase of infection. The  lowest concentration of SARS-CoV-2 viral copies this assay can detect is 138 copies/mL. A negative result does not preclude SARS-Cov-2 infection and should not be used as the sole basis for treatment or other patient management decisions. A negative result may occur with  improper specimen collection/handling, submission of specimen other than nasopharyngeal swab, presence of viral mutation(s) within the areas targeted by this assay, and inadequate number of viral copies(<138 copies/mL). A negative result must be combined with clinical observations, patient history, and epidemiological information. The expected result is Negative.  Fact Sheet for Patients:  EntrepreneurPulse.com.au  Fact Sheet for Healthcare Providers:  IncredibleEmployment.be  This test is no t yet approved or cleared by the [place] FDA and  has been authorized for detection and/or diagnosis of SARS-CoV-2 by FDA under an Emergency Use Authorization (EUA). This EUA will remain  in effect (meaning this test can be used) for the duration of the COVID-19 declaration under Section 564(b)(1) of the Act, 21 U.S.C.section 360bbb-3(b)(1), unless the authorization is terminated  or revoked sooner.       Influenza A by PCR NEGATIVE NEGATIVE Final   Influenza B by PCR NEGATIVE NEGATIVE Final    Comment: (NOTE) The Xpert Xpress SARS-CoV-2/FLU/RSV plus assay is intended as an aid in the diagnosis of influenza from Nasopharyngeal swab specimens and should not be used as a sole basis for treatment. Nasal washings and aspirates are unacceptable for Xpert Xpress SARS-CoV-2/FLU/RSV testing.  Fact Sheet for Patients: EntrepreneurPulse.com.au  Fact Sheet for Healthcare Providers: IncredibleEmployment.be  This test is not yet approved or cleared by the [place] FDA and has been authorized for detection and/or diagnosis of SARS-CoV-2 by FDA under  an Emergency Use Authorization (EUA). This EUA will remain in effect (meaning this test can be used) for the duration of the COVID-19 declaration under Section 564(b)(1) of the Act, 21 U.S.C. section 360bbb-3(b)(1), unless the authorization is terminated or revoked.  Performed at [facility], [place] [address], [place], [place] [address]   MRSA Next Gen by PCR, Nasal     Status: Abnormal   Collection Time: 06/25/21 12:49 AM   Specimen: Nasal Mucosa; Nasal Swab  Result Value Ref Range Status   MRSA by PCR Next Gen DETECTED (A) NOT DETECTED Final    Comment: CRITICAL RESULT CALLED TO, READ BACK BY AND VERIFIED WITH: HARRIS, K. RN ON 06/25/2021 @ 0640 BY MECIAL J. (NOTE) The GeneXpert MRSA Assay (FDA approved for NASAL specimens only), is one component of a comprehensive MRSA colonization surveillance program. It is not intended to diagnose MRSA infection nor to guide or monitor treatment for MRSA infections. Test performance is not FDA approved in patients less than 67 years old. Performed at [facility], [place] [address], [place], [place] [address]   Respiratory (~20 pathogens) panel by PCR     Status: None   Collection Time: 06/25/21 12:49 AM   Specimen: Nasal Mucosa; Respiratory  Result Value Ref Range Status   Adenovirus NOT DETECTED NOT DETECTED Final   Coronavirus 229E NOT DETECTED NOT DETECTED Final    Comment: (NOTE) The Coronavirus on the Respiratory Panel, DOES NOT test for the novel  Coronavirus (2019 nCoV)    Coronavirus HKU1 NOT DETECTED NOT DETECTED Final   Coronavirus NL63 NOT DETECTED NOT DETECTED Final   Coronavirus OC43 NOT DETECTED NOT DETECTED Final   Metapneumovirus NOT DETECTED NOT DETECTED Final   Rhinovirus / Enterovirus NOT DETECTED NOT DETECTED Final   Influenza A NOT DETECTED NOT DETECTED Final   Influenza B NOT DETECTED NOT DETECTED Final   Parainfluenza Virus 1 NOT DETECTED NOT DETECTED Final   Parainfluenza  Virus 2 NOT DETECTED NOT DETECTED Final   Parainfluenza Virus 3 NOT DETECTED NOT DETECTED Final   Parainfluenza Virus 4 NOT DETECTED NOT DETECTED Final   Respiratory Syncytial Virus NOT DETECTED NOT DETECTED Final   Bordetella pertussis NOT DETECTED NOT DETECTED Final   Bordetella Parapertussis NOT DETECTED NOT DETECTED Final   Chlamydophila pneumoniae NOT DETECTED NOT DETECTED Final   Mycoplasma pneumoniae NOT DETECTED NOT DETECTED Final    Comment: Performed at [facility], [place]. [address], [place], [place] [address]      Radiology Studies: CT Angio Chest PE W and/or Wo Contrast  Result Date: 06/25/2021 CLINICAL DATA:  History of pneumonia 1 month ago with persistent hypoxia EXAM: CT ANGIOGRAPHY CHEST WITH CONTRAST TECHNIQUE: Multidetector CT imaging of the chest was performed using the standard protocol during bolus administration of intravenous contrast. Multiplanar CT image reconstructions and MIPs were obtained to evaluate the vascular anatomy. CONTRAST:  16m OMNIPAQUE IOHEXOL 350 MG/ML SOLN COMPARISON:  Chest x-ray from the previous day. FINDINGS: Cardiovascular: Atherosclerotic calcifications of the thoracic aorta are noted without aneurysmal dilatation. No cardiac enlargement is seen. Coronary calcifications are noted. The pulmonary artery shows a normal branching pattern. No discrete filling defect is identified to suggest pulmonary embolism. Lower lobe branches are somewhat limited due to underlying atelectatic changes as well as patient motion artifact. Mediastinum/Nodes: Thoracic inlet is within normal limits. No sizable hilar or mediastinal adenopathy is noted. The esophagus as visualized is within normal limits. Lungs/Pleura: Lungs are well aerated bilaterally with  the exception of mild bibasilar atelectatic changes. This is slightly greater on the right than the left. No sizable effusion is seen. No sizable parenchymal nodule is noted. Upper Abdomen: Visualized upper abdomen is  within normal limits. Musculoskeletal: Degenerative changes of the thoracic spine are noted. No acute bony abnormality is seen. Review of the MIP images confirms the above findings. IMPRESSION: No definitive pulmonary embolism is noted. Lower lobe branches particularly on the right are somewhat limited due to underlying atelectasis and patient motion artifact. Bibasilar atelectatic changes right greater than left. No associated effusion is seen Aortic Atherosclerosis (ICD10-I70.0). Electronically Signed   By: Inez Catalina M.D.   On: 06/25/2021 01:35   DG Chest Port 1 View  Result Date: 06/24/2021 CLINICAL DATA:  History of pneumonia, tobacco use EXAM: PORTABLE CHEST 1 VIEW COMPARISON:  04/03/2021 FINDINGS: Cardiac shadow is stable. The lungs are well aerated bilaterally. Right basilar atelectatic changes are seen. No focal confluent infiltrate is noted. Mild persistent vascular congestion remains. No bony abnormality is seen. IMPRESSION: Mild vascular congestion and right basilar atelectasis. No other focal abnormality is noted. Electronically Signed   By: Inez Catalina M.D.   On: 06/24/2021 21:11       LOS: 0 days   [facility] Pager on [email or web address]  06/25/2021, 9:23 AM

## 2021-06-25 NOTE — ED Notes (Signed)
Respiratory called for Bipap

## 2021-06-26 LAB — BASIC METABOLIC PANEL
Anion gap: 7 (ref 5–15)
BUN: 22 mg/dL (ref 8–23)
CO2: 30 mmol/L (ref 22–32)
Calcium: 8.2 mg/dL — ABNORMAL LOW (ref 8.9–10.3)
Chloride: 100 mmol/L (ref 98–111)
Creatinine, Ser: 0.95 mg/dL (ref 0.61–1.24)
GFR, Estimated: >60 mL/min (ref >60)
Glucose, Bld: 193 mg/dL — ABNORMAL HIGH (ref 70–99)
Potassium: 4.5 mmol/L (ref 3.5–5.1)
Sodium: 137 mmol/L (ref 135–145)

## 2021-06-26 LAB — GLUCOSE, CAPILLARY
Glucose-Capillary: 172 mg/dL — ABNORMAL HIGH (ref 70–99)
Glucose-Capillary: 158 mg/dL — ABNORMAL HIGH (ref 70–99)
Glucose-Capillary: 255 mg/dL — ABNORMAL HIGH (ref 70–99)

## 2021-06-26 LAB — CBC
HCT: 34.8 % — ABNORMAL LOW (ref 39.0–52.0)
Hemoglobin: 9.9 g/dL — ABNORMAL LOW (ref 13.0–17.0)
MCH: 20.5 pg — ABNORMAL LOW (ref 26.0–34.0)
MCHC: 28.4 g/dL — ABNORMAL LOW (ref 30.0–36.0)
MCV: 72.2 fL — ABNORMAL LOW (ref 80.0–100.0)
Platelets: 205 10*3/uL (ref 150–400)
RBC: 4.82 MIL/uL (ref 4.22–5.81)
RDW: 21.2 % — ABNORMAL HIGH (ref 11.5–15.5)
WBC: 6.7 10*3/uL (ref 4.0–10.5)
nRBC: 0 % (ref 0.0–0.2)

## 2021-06-26 LAB — LEGIONELLA PNEUMOPHILA SEROGP 1 UR AG: L. pneumophila Serogp 1 Ur Ag: NEGATIVE

## 2021-06-26 LAB — PROCALCITONIN: Procalcitonin: <0.1 ng/mL

## 2021-06-26 MED ORDER — AZITHROMYCIN 250 MG PO TABS
500.0000 mg | ORAL_TABLET | Freq: Every day | ORAL | Status: AC
  Administered 2021-06-26 – 2021-06-29 (×4): 500 mg via ORAL
  Filled 2021-06-26 (×4): qty 2

## 2021-06-26 MED ORDER — CEFDINIR 300 MG PO CAPS
300.0000 mg | ORAL_CAPSULE | Freq: Two times a day (BID) | ORAL | Status: DC
  Administered 2021-06-26 – 2021-06-29 (×7): 300 mg via ORAL
  Filled 2021-06-26 (×8): qty 1

## 2021-06-26 MED ORDER — OXYCODONE-ACETAMINOPHEN 5-325 MG PO TABS
1.0000 | ORAL_TABLET | Freq: Once | ORAL | Status: AC
  Administered 2021-06-26: 1 via ORAL
  Filled 2021-06-26: qty 1

## 2021-06-26 MED ORDER — LISINOPRIL 5 MG PO TABS
2.5000 mg | ORAL_TABLET | Freq: Every day | ORAL | Status: DC
  Administered 2021-06-26 – 2021-06-29 (×4): 2.5 mg via ORAL
  Filled 2021-06-26 (×4): qty 1

## 2021-06-26 MED ORDER — HYDRALAZINE HCL 20 MG/ML IJ SOLN
5.0000 mg | Freq: Four times a day (QID) | INTRAMUSCULAR | Status: DC | PRN
  Administered 2021-06-26: 5 mg via INTRAVENOUS
  Filled 2021-06-26: qty 1

## 2021-06-26 MED ORDER — IPRATROPIUM-ALBUTEROL 0.5-2.5 (3) MG/3ML IN SOLN: 3.0000 mL | Freq: Four times a day (QID) | RESPIRATORY_TRACT | Status: DC | PRN

## 2021-06-26 NOTE — Plan of Care (Signed)
  Problem: Education: Goal: Knowledge of General Education information will improve Description: Including pain rating scale, medication(s)/side effects and non-pharmacologic comfort measures Outcome: Progressing   Problem: Clinical Measurements: Goal: Diagnostic test results will improve Outcome: Progressing   Problem: Nutrition: Goal: Adequate nutrition will be maintained Outcome: Progressing   Problem: Coping: Goal: Level of anxiety will decrease Outcome: Progressing

## 2021-06-26 NOTE — Evaluation (Signed)
Physical Therapy Evaluation Patient Details Name: [name] MRN: [id number] DOB: 12-27-1955 Today's Date: 06/26/2021  History of Present Illness  Patient is a 65 year old male admitted from SNF where he resides due to shortness of breath. Pt dx with Acute on chronic respiratory failure with hypoxia and hypercapnia, community aquired PNA. PMH includes dCHF, HTN, DM, chronic hypoxic and hypercapnic resp failure on 2L O2 at baseline.chronic LE cellulitis  Clinical Impression  Patient presents with significant impairments of LLE,present on admission.Lacks knee flexion and Hip flexion. 2 max assist to sit at bed edge, leans to right due to lack of hip flex on Lt. Unable to safely attempt standing  per patient  instruction to raise bed to full height due to potential to slide forward on bed edge. Assisted Back in bed with total assistance.  Patient has unna boots both legs.    Patient comes from SNF and plans to return. Patient stated that PT was  beginning to work on ambulation with a cane. Pt admitted with above diagnosis.  Pt currently with functional limitations due to the deficits listed below (see PT Problem List). Pt will benefit from skilled PT to increase their independence and safety with mobility to allow discharge to the venue listed below.        Recommendations for follow up therapy are one component of a multi-disciplinary discharge planning process, led by the attending physician.  Recommendations may be updated based on patient status, additional functional criteria and insurance authorization.  Follow Up Recommendations SNF (return to SNF)    Equipment Recommendations  None recommended by PT    Recommendations for Other Services       Precautions / Restrictions Precautions Precautions: Fall Precaution Comments: monitor O2, limited L hip/knee flexion Restrictions Weight Bearing Restrictions: No      Mobility  Bed Mobility Overal bed mobility: Needs Assistance Bed  Mobility: Supine to Sit;Sit to Supine     Supine to sit: Max assist;+2 for physical assistance;+2 for safety/equipment;HOB elevated;Total assist Sit to supine: Total assist;+2 for physical assistance;+2 for safety/equipment   General bed mobility comments: requires  max/total assistnace to partially sit upright and balance. leans to right and unable to attempt stnading.    Transfers                 General transfer comment: unable,\  Ambulation/Gait                Stairs            Wheelchair Mobility    Modified Rankin (Stroke Patients Only)       Balance Overall balance assessment: Needs assistance Sitting-balance support: Feet unsupported;Single extremity supported Sitting balance-Leahy Scale: Poor Sitting balance - Comments: therapist blocking legs to ensure patient does not slide off bed, LLe  does not touch floor.. lens to right due to lack of left hip flexion. Postural control: Right lateral lean                                   Pertinent Vitals/Pain Pain Assessment: Faces Faces Pain Scale: Hurts even more Pain Location: legs, back Pain Descriptors / Indicators: Other (Comment);Aching Pain Intervention(s): Repositioned;Monitored during session    [place]  expects to be discharged to:: Skilled nursing facility                 Additional Comments: [name]    Prior Function Level  of Independence: Needs assistance   Gait / Transfers Assistance Needed: patient reports  to stnad, elevates bed to full height, then can stand at Rw and walk short distance to [place] to get into will push his wheelchair or use arms/legs to propel, can also use walker  ADL's / Homemaking Assistance Needed: has supervision for showers, sometimes has assist with donning pants.  Comments: uses RW short distance     Hand Dominance   Dominant Hand: Right    Extremity/Trunk Assessment   Upper Extremity Assessment Upper  Extremity Assessment: Overall WFL for tasks assessed    Lower Extremity Assessment Lower Extremity Assessment: LLE deficits/detail LLE Deficits / Details: knee flexes ~ 20 degress, Hip flexes about 45    Cervical / Trunk Assessment Cervical / Trunk Assessment: Other exceptions Cervical / Trunk Exceptions: body habitus, difficult to assess due to  leaning to right as LLE does not flezx well enough to sit up right.  Communication   Communication: No difficulties  Cognition Arousal/Alertness: Awake/alert Behavior During Therapy: WFL for tasks assessed/performed Overall Cognitive Status: Within Functional Limits for tasks assessed                                 General Comments: Patient directing therapists on mobility stating that he could stand up if bed raised. Patient not deemed safe to attempt.      General Comments General comments (skin integrity, edema, etc.): doffed O2 as patient states does not use except mostly at night. Desat to 84% on room air with attempting bed mobility, donned 2L and increase back to 90s    Exercises     Assessment/Plan    PT Assessment Patient needs continued PT services  PT Problem List Decreased strength;Decreased range of motion;Decreased safety awareness;Decreased mobility;Decreased activity tolerance;Decreased balance;Decreased knowledge of use of DME;Decreased knowledge of precautions       PT Treatment Interventions DME instruction;Therapeutic activities;Gait training;Therapeutic exercise;Patient/family education;Functional mobility training;Balance training    PT Goals (Current goals can be found in the Care Plan section)  Acute Rehab PT Goals Patient Stated Goal: try to stand PT Goal Formulation: With patient Time For Goal Achievement: 07/10/21 Potential to Achieve Goals: Fair    Frequency Min 2X/week   Barriers to discharge        Co-evaluation PT/OT/SLP Co-Evaluation/Treatment: Yes Reason for Co-Treatment: For  patient/therapist safety PT goals addressed during session: Mobility/safety with mobility OT goals addressed during session: ADL's and self-care       AM-PAC PT "6 Clicks" Mobility  Outcome Measure Help needed turning from your back to your side while in a flat bed without using bedrails?: Total Help needed moving from lying on your back to sitting on the side of a flat bed without using bedrails?: Total Help needed moving to and from a bed to a chair (including a wheelchair)?: Total Help needed standing up from a chair using your arms (e.g., wheelchair or bedside chair)?: Total Help needed to walk in hospital room?: Total Help needed climbing 3-5 steps with a railing? : Total 6 Click Score: 6    End of Session   Activity Tolerance: Treatment limited secondary to medical complications (Comment) Patient left: in bed;with call bell/phone within reach;with bed alarm set Nurse Communication: Mobility status PT Visit Diagnosis: Unsteadiness on feet (R26.81);Muscle weakness (generalized) (M62.81)    Time: 1032-1100 PT Time Calculation (min) (ACUTE ONLY): 28 min   Charges:   PT Evaluation $  PT Eval Moderate Complexity: [place] Pager [phone] Office [phone]   [name] 06/26/2021, 4:51 PM

## 2021-06-26 NOTE — Progress Notes (Signed)
PHARMACIST - PHYSICIAN COMMUNICATION DR:   [name] CONCERNING: Antibiotic IV to Oral Route Change Policy  RECOMMENDATION: This patient is receiving azithromycin by the intravenous route.  Based on criteria approved by the Pharmacy and Therapeutics Committee, the antibiotic(s) is/are being converted to the equivalent oral dose form(s).   DESCRIPTION: These criteria include: Patient being treated for a respiratory tract infection, urinary tract infection, cellulitis or clostridium difficile associated diarrhea if on metronidazole The patient is not neutropenic and does not exhibit a GI malabsorption state The patient is eating (either orally or via tube) and/or has been taking other orally administered medications for a least 24 hours The patient is improving clinically and has a Tmax < 100.5  If you have questions about this conversion, please contact the Pharmacy Department  []   [phone] )  [facility] []   [phone] )  [facility]  []   [phone] )  [facility] [x]   [phone] )  [place], PharmD 06/26/2021 10:27 AM

## 2021-06-26 NOTE — Progress Notes (Signed)
Pt transferred to 4E in bed. All of pt's belongings collected. Report was called to 4E RN. Pt stable at time of transfer.

## 2021-06-26 NOTE — Progress Notes (Signed)
Pt seen, currently on 4l [place], HR63, RR20, Spo2 94%.  Pt is awake, alert, no increased wob noted.  Pt states he feels pretty good and does not want to wear bipap tonight.  RN aware.  RN to advise this Probation officer should pt change his mind or increased confusion or wob noted.  Bipap remains in room on standby.

## 2021-06-26 NOTE — Progress Notes (Addendum)
[facility] PROGRESS NOTE   [name] KDT:[id number] DOB: 09-Apr-1956 DOA: 06/24/2021  PCP: Charlsie Merles, MD  Brief History/Interval Summary: 65 y.o. male with medical history significant of dCHF, HTN, DM, chronic hypoxic and hypercapnic resp failure on 2L O2 at baseline.  Patient reports that he is a former smoker.  Patient living in a skilled nursing facility.  Patient was treated for pneumonia about a month ago.  Complained of shortness of breath.  Was found to have saturations in the 70s on 2 L at the skilled nursing facility.  Patient was brought into the hospital.  He was noted to be hypercapnic.  He was placed on BiPAP.  Consultants: None at this time  Procedures: None    Subjective/Interval History: Patient mentions that he is feeling much better this morning.  Shortness of breath has been improving.  Occasional cough.  No chest pain.  No nausea or vomiting.  Per nursing staff he wore his BiPAP just for a few hours and had to take it off due to claustrophobia.     Assessment/Plan:  Acute on chronic respiratory failure with hypoxia and hypercapnia ABG showed evidence for hypercapnic respiratory failure.  He was hypoxic in the 70s on 2 L.  Placed on BiPAP.  Seems to have improved in the last 24 hours.  Can keep him off of BiPAP today.  He is tolerating his diet.  We will transfer him to progressive unit.  Repeat ABG done yesterday was venous.  No need to repeat at this time since there is improvement clinically.  Community-acquired pneumonia No obvious infiltrate noted on imaging studies.  CT scan did raise concern for basilar atelectasis.  No other source of infection is found.  COVID-19 PCR and influenza PCR is negative.  Respiratory viral panel is unremarkable.   Procalcitonin noted to be less than 0.1.  WBC is normal.  Likely a viral syndrome but he is at risk for worsening and secondary bacterial infection so we will leave him on antibacterials and plan for 5-day  course.  We will change ceftriaxone to cefdinir.  Acute kidney injury At baseline creatinine appears to be normal at 0.8-1.0.  Came in with BUN of 30 and creatinine of 1.32.  Furosemide placed on hold.  He was hydrated.   Renal function back to baseline.  Avoid nephrotoxic agents.  Tolerating his diet.  Okay to stop IV fluids.  Monitor urine output.    Chronic diastolic CHF No evidence for CHF exacerbation currently.  Diuretics were placed on hold due to hypovolemia.  Lisinopril was also held.  Renal function has stabilized.  Okay to resume lisinopril.  Recheck labs tomorrow.  Continue to hold diuretics for another day.  Continue to monitor daily weights.  Strict ins and outs.  Diabetes mellitus type 2, uncontrolled with hyperglycemia HbA1c 8.4.  Noted to be on Lantus which is being continued.  On SSI.  Continue to monitor CBGs.  Iron deficiency anemia Drop in hemoglobin is likely dilutional.  Stable for the most part.  No overt bleeding noted.  Continue iron supplements.  History of depression and other unspecified psychiatric illness Noted to be on Abilify as well as the lurasidone.  These have been continued.  Chronic pain Continue Neurontin.  Bilateral lower extremity ulcers Both legs are covered in [employer].  He tells me that this is changed to 3 times a week.  Seen by wound care nurse.  These have been changed.  Obesity Estimated body mass index  is 31.48 kg/m as calculated from the following:   Height as of this encounter: 6' 5"  (1.956 m).   Weight as of this encounter: 120.4 kg.    DVT Prophylaxis: Lovenox Code Status: Full code Family Communication: Discussed with the patient.  No family at bedside Disposition Plan: PT/ OT evaluation.  Hopefully return to SNF in 24 to 48 hours.  Status is: Inpatient  Remains inpatient appropriate because:IV treatments appropriate due to intensity of illness or inability to take PO and Inpatient level of care appropriate due to severity  of illness  Dispo: The patient is from: SNF              Anticipated d/c is to: SNF              Patient currently is not medically stable to d/c.   Difficult to place patient No      Medications: Scheduled:  ARIPiprazole  2 mg Oral QHS   aspirin EC  81 mg Oral Daily   azithromycin  500 mg Oral Daily   chlorhexidine gluconate (MEDLINE KIT)  5 mL Mouth/Throat BID   Chlorhexidine Gluconate Cloth  6 each Topical Daily   Chlorhexidine Gluconate Cloth  6 each Topical Q0600   cholecalciferol  2,000 Units Oral Daily   enoxaparin (LOVENOX) injection  40 mg Subcutaneous Q24H   ferrous sulfate  325 mg Oral Q breakfast   gabapentin  300 mg Oral TID   insulin aspart  0-15 Units Subcutaneous TID WC   insulin glargine-yfgn  25 Units Subcutaneous BID   mouth rinse  15 mL Mouth Rinse BID   melatonin  3 mg Oral QHS   multivitamin with minerals  1 tablet Oral Daily   mupirocin ointment  1 application Nasal BID   sodium hypochlorite   Irrigation Daily   Vilazodone HCl  40 mg Oral Daily   Continuous:  cefTRIAXone (ROCEPHIN)  IV Stopped (06/25/21 2243)   lactated ringers 75 mL/hr at 06/26/21 0933   HYW:VPXTGGYIRSWNI, ipratropium-albuterol, tiZANidine, traZODone  Antibiotics: Anti-infectives (From admission, onward)    Start     Dose/Rate Route Frequency Ordered Stop   06/26/21 1700  azithromycin (ZITHROMAX) tablet 500 mg        500 mg Oral Daily 06/26/21 1027 06/30/21 1659   06/25/21 2200  cefTRIAXone (ROCEPHIN) 2 g in sodium chloride 0.9 % 100 mL IVPB        2 g 200 mL/hr over 30 Minutes Intravenous Every 24 hours 06/25/21 0049 06/30/21 2159   06/25/21 2200  azithromycin (ZITHROMAX) 500 mg in sodium chloride 0.9 % 250 mL IVPB  Status:  Discontinued        500 mg 250 mL/hr over 60 Minutes Intravenous Every 24 hours 06/25/21 0049 06/26/21 1027   06/24/21 2145  cefTRIAXone (ROCEPHIN) 1 g in sodium chloride 0.9 % 100 mL IVPB        1 g 200 mL/hr over 30 Minutes Intravenous  Once 06/24/21  2144 06/24/21 2239   06/24/21 2145  azithromycin (ZITHROMAX) 500 mg in sodium chloride 0.9 % 250 mL IVPB        500 mg 250 mL/hr over 60 Minutes Intravenous  Once 06/24/21 2144 06/24/21 2322       Objective:  Vital Signs  Vitals:   06/26/21 0700 06/26/21 0800 06/26/21 0900 06/26/21 1000  BP: (!) 130/55  (!) 161/88 (!) 160/51  Pulse: 60 61 77 72  Resp: 20 19  15   Temp:  TempSrc:      SpO2: 91% 97% 91% 97%  Weight:      Height:        Intake/Output Summary (Last 24 hours) at 06/26/2021 1133 Last data filed at 06/26/2021 1056 Gross per 24 hour  Intake 3323.11 ml  Output 925 ml  Net 2398.11 ml    Filed Weights   06/24/21 2037 06/25/21 0900  Weight: 125.6 kg 120.4 kg    General appearance: Awake alert.  In no distress Resp: Normal effort at rest.  Crackles bilateral bases.  No wheezing or rhonchi. Cardio: S1-S2 is normal regular.  No S3-S4.  No rubs murmurs or bruit GI: Abdomen is soft.  Nontender nondistended.  Bowel sounds are present normal.  No masses organomegaly Extremities: Both lower extremities covered in [place] boots. Neurologic: Alert and oriented x3.  No focal neurological deficits.      Lab Results:  Data Reviewed: I have personally reviewed following labs and imaging studies  CBC: Recent Labs  Lab 06/24/21 2043 06/25/21 0110 06/26/21 0253  WBC 11.5* 9.9 6.7  NEUTROABS 9.6*  --   --   HGB 10.7* 8.0* 9.9*  HCT 38.6* 29.3* 34.8*  MCV 71.9* 74.0* 72.2*  PLT 235 171 205     Basic Metabolic Panel: Recent Labs  Lab 06/24/21 2043 06/25/21 0110 06/26/21 0253  NA 141 139 137  K 4.6 4.6 4.5  CL 101 98 100  CO2 34* 32 30  GLUCOSE 226* 223* 193*  BUN 30* 31* 22  CREATININE 1.32* 1.30* 0.95  CALCIUM 8.7* 8.4* 8.2*     GFR: Estimated Creatinine Clearance: 108.4 mL/min (by C-G formula based on SCr of 0.95 mg/dL).  Liver Function Tests: Recent Labs  Lab 06/24/21 2043 06/25/21 0110  AST 18 18  ALT 17 17  ALKPHOS 83 82  BILITOT 0.5  0.5  PROT 7.3 7.3  ALBUMIN 2.9* 2.9*      HbA1C: Recent Labs    06/25/21 0110  HGBA1C 8.4*     CBG: Recent Labs  Lab 06/25/21 0858 06/25/21 1139 06/25/21 1622 06/25/21 2127 06/26/21 0805  GLUCAP 127* 129* 236* 216* 172*     Anemia Panel: Recent Labs    06/25/21 0110  TIBC 262  IRON 17*     Recent Results (from the past 240 hour(s))  Resp Panel by RT-PCR (Flu A&B, Covid) Nasopharyngeal Swab     Status: None   Collection Time: 06/24/21  8:45 PM   Specimen: Nasopharyngeal Swab; Nasopharyngeal(NP) swabs in vial transport medium  Result Value Ref Range Status   SARS Coronavirus 2 by RT PCR NEGATIVE NEGATIVE Final    Comment: (NOTE) SARS-CoV-2 target nucleic acids are NOT DETECTED.  The SARS-CoV-2 RNA is generally detectable in upper respiratory specimens during the acute phase of infection. The lowest concentration of SARS-CoV-2 viral copies this assay can detect is 138 copies/mL. A negative result does not preclude SARS-Cov-2 infection and should not be used as the sole basis for treatment or other patient management decisions. A negative result may occur with  improper specimen collection/handling, submission of specimen other than nasopharyngeal swab, presence of viral mutation(s) within the areas targeted by this assay, and inadequate number of viral copies(<138 copies/mL). A negative result must be combined with clinical observations, patient history, and epidemiological information. The expected result is Negative.  Fact Sheet for Patients:  EntrepreneurPulse.com.au  Fact Sheet for Healthcare Providers:  IncredibleEmployment.be  This test is no t yet approved or cleared by the [place] FDA  and  has been authorized for detection and/or diagnosis of SARS-CoV-2 by FDA under an Emergency Use Authorization (EUA). This EUA will remain  in effect (meaning this test can be used) for the duration of the COVID-19  declaration under Section 564(b)(1) of the Act, 21 U.S.C.section 360bbb-3(b)(1), unless the authorization is terminated  or revoked sooner.       Influenza A by PCR NEGATIVE NEGATIVE Final   Influenza B by PCR NEGATIVE NEGATIVE Final    Comment: (NOTE) The Xpert Xpress SARS-CoV-2/FLU/RSV plus assay is intended as an aid in the diagnosis of influenza from Nasopharyngeal swab specimens and should not be used as a sole basis for treatment. Nasal washings and aspirates are unacceptable for Xpert Xpress SARS-CoV-2/FLU/RSV testing.  Fact Sheet for Patients: EntrepreneurPulse.com.au  Fact Sheet for Healthcare Providers: IncredibleEmployment.be  This test is not yet approved or cleared by the [place] FDA and has been authorized for detection and/or diagnosis of SARS-CoV-2 by FDA under an Emergency Use Authorization (EUA). This EUA will remain in effect (meaning this test can be used) for the duration of the COVID-19 declaration under Section 564(b)(1) of the Act, 21 U.S.C. section 360bbb-3(b)(1), unless the authorization is terminated or revoked.  Performed at [facility], [place] [address], [place], [place] [address]   MRSA Next Gen by PCR, Nasal     Status: Abnormal   Collection Time: 06/25/21 12:49 AM   Specimen: Nasal Mucosa; Nasal Swab  Result Value Ref Range Status   MRSA by PCR Next Gen DETECTED (A) NOT DETECTED Final    Comment: CRITICAL RESULT CALLED TO, READ BACK BY AND VERIFIED WITH: HARRIS, K. RN ON 06/25/2021 @ 0640 BY MECIAL J. (NOTE) The GeneXpert MRSA Assay (FDA approved for NASAL specimens only), is one component of a comprehensive MRSA colonization surveillance program. It is not intended to diagnose MRSA infection nor to guide or monitor treatment for MRSA infections. Test performance is not FDA approved in patients less than 32 years old. Performed at [facility], [place] [address] [place], [place] [address]   Respiratory (~20 pathogens) panel by PCR     Status: None   Collection Time: 06/25/21 12:49 AM   Specimen: Nasal Mucosa; Respiratory  Result Value Ref Range Status   Adenovirus NOT DETECTED NOT DETECTED Final   Coronavirus 229E NOT DETECTED NOT DETECTED Final    Comment: (NOTE) The Coronavirus on the Respiratory Panel, DOES NOT test for the novel  Coronavirus (2019 nCoV)    Coronavirus HKU1 NOT DETECTED NOT DETECTED Final   Coronavirus NL63 NOT DETECTED NOT DETECTED Final   Coronavirus OC43 NOT DETECTED NOT DETECTED Final   Metapneumovirus NOT DETECTED NOT DETECTED Final   Rhinovirus / Enterovirus NOT DETECTED NOT DETECTED Final   Influenza A NOT DETECTED NOT DETECTED Final   Influenza B NOT DETECTED NOT DETECTED Final   Parainfluenza Virus 1 NOT DETECTED NOT DETECTED Final   Parainfluenza Virus 2 NOT DETECTED NOT DETECTED Final   Parainfluenza Virus 3 NOT DETECTED NOT DETECTED Final   Parainfluenza Virus 4 NOT DETECTED NOT DETECTED Final   Respiratory Syncytial Virus NOT DETECTED NOT DETECTED Final   Bordetella pertussis NOT DETECTED NOT DETECTED Final   Bordetella Parapertussis NOT DETECTED NOT DETECTED Final   Chlamydophila pneumoniae NOT DETECTED NOT DETECTED Final   Mycoplasma pneumoniae NOT DETECTED NOT DETECTED Final    Comment: Performed at [facility], [place]. [address], [place], [place] [address]  Radiology Studies: CT Angio Chest PE W and/or Wo Contrast  Result Date: 06/25/2021 CLINICAL DATA:  History of pneumonia 1 month ago with persistent hypoxia EXAM: CT ANGIOGRAPHY CHEST WITH CONTRAST TECHNIQUE: Multidetector CT imaging of the chest was performed using the standard protocol during bolus administration of intravenous contrast. Multiplanar CT image reconstructions and MIPs were obtained to evaluate the vascular anatomy. CONTRAST:  48m OMNIPAQUE IOHEXOL 350 MG/ML SOLN COMPARISON:  Chest x-ray from the previous day. FINDINGS:  Cardiovascular: Atherosclerotic calcifications of the thoracic aorta are noted without aneurysmal dilatation. No cardiac enlargement is seen. Coronary calcifications are noted. The pulmonary artery shows a normal branching pattern. No discrete filling defect is identified to suggest pulmonary embolism. Lower lobe branches are somewhat limited due to underlying atelectatic changes as well as patient motion artifact. Mediastinum/Nodes: Thoracic inlet is within normal limits. No sizable hilar or mediastinal adenopathy is noted. The esophagus as visualized is within normal limits. Lungs/Pleura: Lungs are well aerated bilaterally with the exception of mild bibasilar atelectatic changes. This is slightly greater on the right than the left. No sizable effusion is seen. No sizable parenchymal nodule is noted. Upper Abdomen: Visualized upper abdomen is within normal limits. Musculoskeletal: Degenerative changes of the thoracic spine are noted. No acute bony abnormality is seen. Review of the MIP images confirms the above findings. IMPRESSION: No definitive pulmonary embolism is noted. Lower lobe branches particularly on the right are somewhat limited due to underlying atelectasis and patient motion artifact. Bibasilar atelectatic changes right greater than left. No associated effusion is seen Aortic Atherosclerosis (ICD10-I70.0). Electronically Signed   By: MInez CatalinaM.D.   On: 06/25/2021 01:35   DG Chest Port 1 View  Result Date: 06/24/2021 CLINICAL DATA:  History of pneumonia, tobacco use EXAM: PORTABLE CHEST 1 VIEW COMPARISON:  04/03/2021 FINDINGS: Cardiac shadow is stable. The lungs are well aerated bilaterally. Right basilar atelectatic changes are seen. No focal confluent infiltrate is noted. Mild persistent vascular congestion remains. No bony abnormality is seen. IMPRESSION: Mild vascular congestion and right basilar atelectasis. No other focal abnormality is noted. Electronically Signed   By: MInez Catalina M.D.   On: 06/24/2021 21:11       LOS: 1 day   [facility] Pager on [email or web address]  06/26/2021, 11:33 AM

## 2021-06-26 NOTE — Evaluation (Signed)
Occupational Therapy Evaluation Patient Details Name: [name] MRN: [id number] DOB: 1955/11/13 Today's Date: 06/26/2021   History of Present Illness Patient is a 65 year old male admitted from SNF where he resides due to shortness of breath. Pt dx with Acute on chronic respiratory failure with hypoxia and hypercapnia, community aquired PNA. PMH includes dCHF, HTN, DM, chronic hypoxic and hypercapnic resp failure on 2L O2 at baseline.   Clinical Impression   Patient resides at [facility] home. At baseline reports can either ambulate while pushing wheelchair, walker or uses wheelchair and self propels. Has supervision while standing in the shower and sometimes needs assist with don/doff pants "depending on the fit." Reports wearing 2-3L mostly at night, sometimes when getting out of bed "if its taxing me." Patient needing max A x2 for supine to sit today, with limited flexion at L hip and knee and max difficulty trying to sit upright off R elbow causing patient to slide toward edge of bed. Patient wanting therapy to elevated bed height as he does this at [facility] "as high as it will go" then stands. Unfortunately patient sliding and unable to safely place feet onto floor with therapy blocking legs, needing third assist to slide patient's hips back onto the bed and transition to supine. Recommend continued acute OT services to maximize patient safety and independence with self care in order to return back to [facility] with increased assist from staff as needed.      Recommendations for follow up therapy are one component of a multi-disciplinary discharge planning process, led by the attending physician.  Recommendations may be updated based on patient status, additional functional criteria and insurance authorization.   Follow Up Recommendations  Other (comment) (return to [facility])    Equipment Recommendations  None recommended by OT       Precautions / Restrictions  Precautions Precautions: Fall Precaution Comments: monitor O2, limited L hip/knee flexion Restrictions Weight Bearing Restrictions: No      Mobility Bed Mobility Overal bed mobility: Needs Assistance Bed Mobility: Supine to Sit;Sit to Supine     Supine to sit: Max assist;+2 for physical assistance;+2 for safety/equipment;HOB elevated Sit to supine: Total assist (+3)        Transfers                 General transfer comment: unable, please see toliet transfer in ADL section    Balance Overall balance assessment: Needs assistance Sitting-balance support: Feet unsupported;Single extremity supported Sitting balance-Leahy Scale: Poor Sitting balance - Comments: therapist blocking legs to ensure patient does not slide off bed Postural control: Right lateral lean                                 ADL either performed or assessed with clinical judgement   ADL Overall ADL's : Needs assistance/impaired     Grooming: Set up;Bed level   Upper Body Bathing: Minimal assistance;Bed level   Lower Body Bathing: Maximal assistance;Bed level   Upper Body Dressing : Minimal assistance;Bed level   Lower Body Dressing: Maximal assistance;Bed level Lower Body Dressing Details (indicate cue type and reason): unable to safely maintain sitting position at edge of bed, sliding forward.   Toilet Transfer Details (indicate cue type and reason): Unable. Attempted sitting at edge of bed however patient unable to flex L hip or knee in order to place foot on the floor and limited ability to  maintain R foot to floor. Patient's hips sliding forward towards edge of bed therefore transitioned back to bed with +3 assist Toileting- Clothing Manipulation and Hygiene: Total assistance;Bed level         General ADL Comments: Patient reports at baseline he raises height of his bed at [facility] as high as it will go and he stands from this level. Currently patient is unable to come up  from R elbow or flex at L hip enough to get bilateral feet to floor. L leg in extension. Patient hips sliding forward, due to fall risk returned to semi-supine with +3 assist      Pertinent Vitals/Pain Pain Assessment: Faces Faces Pain Scale: Hurts even more Pain Location: legs, back Pain Descriptors / Indicators: Other (Comment);Aching (chronic) Pain Intervention(s): Monitored during session     Hand Dominance Right   Extremity/Trunk Assessment Upper Extremity Assessment Upper Extremity Assessment: Overall WFL for tasks assessed   Lower Extremity Assessment Lower Extremity Assessment: Defer to PT evaluation   Cervical / Trunk Assessment Cervical / Trunk Assessment: Other exceptions Cervical / Trunk Exceptions: body habitus   Communication Communication Communication: No difficulties   Cognition Arousal/Alertness: Awake/alert Behavior During Therapy: WFL for tasks assessed/performed Overall Cognitive Status: Within Functional Limits for tasks assessed                                     General Comments  doffed O2 as patient states does not use except mostly at night. Desat to 84% on room air with attempting bed mobility, donned 2L and increase back to [place] expects to be discharged to:: Skilled nursing facility                                 Additional Comments: [name]      Prior Functioning/Environment Level of Independence: Needs assistance  Gait / Transfers Assistance Needed: patient reports will push his wheelchair or use arms/legs to propel, can also use walker ADL's / Homemaking Assistance Needed: has supervision for showers, sometimes has assist with donning pants.            OT Problem List: Decreased activity tolerance;Impaired balance (sitting and/or standing);Cardiopulmonary status limiting activity;Obesity;Decreased safety awareness      OT Treatment/Interventions:  Self-care/ADL training;Balance training;Patient/family education;Therapeutic activities    OT Goals(Current goals can be found in the care plan section) Acute Rehab OT Goals Patient Stated Goal: try to stand OT Goal Formulation: With patient Time For Goal Achievement: 07/10/21 Potential to Achieve Goals: Good  OT Frequency: Min 2X/week           Co-evaluation PT/OT/SLP Co-Evaluation/Treatment: Yes Reason for Co-Treatment: Complexity of the patient's impairments (multi-system involvement);For patient/therapist safety;To address functional/ADL transfers PT goals addressed during session: Mobility/safety with mobility OT goals addressed during session: ADL's and self-care      AM-PAC OT "6 Clicks" Daily Activity     Outcome Measure Help from another person eating meals?: A Little Help from another person taking care of personal grooming?: A Little Help from another person toileting, which includes using toliet, bedpan, or urinal?: Total Help from another person bathing (including washing, rinsing, drying)?: A Lot Help from another person to put on and taking off regular upper body clothing?: A Little Help from another person  to put on and taking off regular lower body clothing?: A Lot 6 Click Score: 14   End of Session Equipment Utilized During Treatment: Oxygen Nurse Communication: Mobility status  Activity Tolerance: Patient tolerated treatment well Patient left: in bed;with call bell/phone within reach;with nursing/sitter in room  OT Visit Diagnosis: Other abnormalities of gait and mobility (R26.89)                Time: 4696-2952 OT Time Calculation (min): 26 min Charges:  OT General Charges $OT Visit: 1 Visit OT Evaluation $OT Eval Moderate Complexity: [place] OT OT pager: [place] 06/26/2021, 1:20 PM

## 2021-06-27 LAB — BASIC METABOLIC PANEL
Anion gap: 7 (ref 5–15)
BUN: 17 mg/dL (ref 8–23)
CO2: 31 mmol/L (ref 22–32)
Calcium: 8.4 mg/dL — ABNORMAL LOW (ref 8.9–10.3)
Chloride: 98 mmol/L (ref 98–111)
Creatinine, Ser: 0.85 mg/dL (ref 0.61–1.24)
GFR, Estimated: >60 mL/min (ref >60)
Glucose, Bld: 157 mg/dL — ABNORMAL HIGH (ref 70–99)
Potassium: 4.5 mmol/L (ref 3.5–5.1)
Sodium: 136 mmol/L (ref 135–145)

## 2021-06-27 LAB — CBC
HCT: 35.8 % — ABNORMAL LOW (ref 39.0–52.0)
Hemoglobin: 9.9 g/dL — ABNORMAL LOW (ref 13.0–17.0)
MCH: 20.1 pg — ABNORMAL LOW (ref 26.0–34.0)
MCHC: 27.7 g/dL — ABNORMAL LOW (ref 30.0–36.0)
MCV: 72.6 fL — ABNORMAL LOW (ref 80.0–100.0)
Platelets: 220 10*3/uL (ref 150–400)
RBC: 4.93 MIL/uL (ref 4.22–5.81)
RDW: 21.6 % — ABNORMAL HIGH (ref 11.5–15.5)
WBC: 5.9 10*3/uL (ref 4.0–10.5)
nRBC: 0 % (ref 0.0–0.2)

## 2021-06-27 LAB — GLUCOSE, CAPILLARY
Glucose-Capillary: 263 mg/dL — ABNORMAL HIGH (ref 70–99)
Glucose-Capillary: 112 mg/dL — ABNORMAL HIGH (ref 70–99)
Glucose-Capillary: 161 mg/dL — ABNORMAL HIGH (ref 70–99)
Glucose-Capillary: 153 mg/dL — ABNORMAL HIGH (ref 70–99)
Glucose-Capillary: 198 mg/dL — ABNORMAL HIGH (ref 70–99)

## 2021-06-27 LAB — PROCALCITONIN: Procalcitonin: <0.1 ng/mL

## 2021-06-27 NOTE — Progress Notes (Signed)
[facility] PROGRESS NOTE   [name] YBW:[id number] DOB: 25-Feb-1956 DOA: 06/24/2021  PCP: Charlsie Merles, MD  Brief History/Interval Summary: 65 y.o. male with medical history significant of dCHF, HTN, DM, chronic hypoxic and hypercapnic resp failure on 2L O2 at baseline.  Patient reports that he is a former smoker.  Patient living in a skilled nursing facility.  Patient was treated for pneumonia about a month ago.  Complained of shortness of breath.  Was found to have saturations in the 70s on 2 L at the skilled nursing facility.  Patient was brought into the hospital.  He was noted to be hypercapnic.  He was placed on BiPAP.  Consultants: None at this time  Procedures: None  Subjective/Interval History: No acute issues or events overnight, patient refused BiPAP overnight again stating "I did not need it" otherwise denies chest pain nausea vomiting diarrhea constipation headache fevers chills  Assessment/Plan:  Acute on chronic respiratory failure with hypoxia and hypercapnia -Initially placed on BiPAP, able to wean to nasal cannula at this point  -Continue as needed BiPAP as needed/nightly   Community-acquired pneumonia -Likely viral although cannot rule out bacterial pneumonia continue 5-day course of antibiotics currently on cefdinir -COVID reassuringly negative, viral panel also negative -Chest x-ray unremarkable although CT showed questionable bibasilar atelectasis  Acute kidney injury, resolved -Likely poor p.o. intake exacerbated by diuretics -Creatinine currently within normal limits after completing IV fluids  Chronic diastolic CHF, no acute exacerbation -Monitor closely in the setting of diuretic cessation and IV fluids as above -IV fluids not discontinued, continue to follow clinically, resume diuretics in the next 24 to 48 hours or at discharge  Diabetes mellitus type 2, uncontrolled with hyperglycemia HbA1c 8.4. Continue Lantus, sliding scale  insulin, diabetic diet and hypoglycemic protocol  Iron deficiency anemia Drop in hemoglobin is likely dilutional.  Stable for the most part.  No overt bleeding noted.  Continue iron supplements.  History of depression and other unspecified psychiatric illness Noted to be on Abilify as well as the lurasidone.  These have been continued.  Chronic pain Continue Neurontin.  Bilateral lower extremity ulcers Both legs are covered in [employer].  He tells me that this is changed to 3 times a week.  Seen by wound care nurse  Obesity Estimated body mass index is 31.48 kg/m as calculated from the following:   Height as of this encounter: 6' 5"  (1.956 m).   Weight as of this encounter: 120.4 kg.    DVT Prophylaxis: Lovenox Code Status: Full code Family Communication: Discussed with the patient.  No family available Disposition Plan: PT/ OT evaluation.  Hopefully return to SNF in 24 to 48 hours.  Status is: Inpatient  Remains inpatient appropriate because:IV treatments appropriate due to intensity of illness or inability to take PO and Inpatient level of care appropriate due to severity of illness  Dispo: The patient is from: SNF              Anticipated d/c is to: SNF              Patient currently is not medically stable to d/c.   Difficult to place patient No   Medications: Scheduled:  ARIPiprazole  2 mg Oral QHS   aspirin EC  81 mg Oral Daily   azithromycin  500 mg Oral Daily   cefdinir  300 mg Oral Q12H   chlorhexidine gluconate (MEDLINE KIT)  5 mL Mouth/Throat BID   Chlorhexidine Gluconate Cloth  6 each  Topical Daily   Chlorhexidine Gluconate Cloth  6 each Topical Q0600   cholecalciferol  2,000 Units Oral Daily   enoxaparin (LOVENOX) injection  40 mg Subcutaneous Q24H   ferrous sulfate  325 mg Oral Q breakfast   gabapentin  300 mg Oral TID   insulin aspart  0-15 Units Subcutaneous TID WC   insulin glargine-yfgn  25 Units Subcutaneous BID   lisinopril  2.5 mg Oral Daily    mouth rinse  15 mL Mouth Rinse BID   melatonin  3 mg Oral QHS   multivitamin with minerals  1 tablet Oral Daily   mupirocin ointment  1 application Nasal BID   sodium hypochlorite   Irrigation Daily   Vilazodone HCl  40 mg Oral Daily   Continuous:   JOI:TGPQDIYMEBRAX, hydrALAZINE, ipratropium-albuterol, tiZANidine, traZODone  Antibiotics: Anti-infectives (From admission, onward)    Start     Dose/Rate Route Frequency Ordered Stop   06/26/21 2200  cefdinir (OMNICEF) capsule 300 mg        300 mg Oral Every 12 hours 06/26/21 1143 06/30/21 2159   06/26/21 1700  azithromycin (ZITHROMAX) tablet 500 mg        500 mg Oral Daily 06/26/21 1027 06/30/21 1659   06/25/21 2200  cefTRIAXone (ROCEPHIN) 2 g in sodium chloride 0.9 % 100 mL IVPB  Status:  Discontinued        2 g 200 mL/hr over 30 Minutes Intravenous Every 24 hours 06/25/21 0049 06/26/21 1143   06/25/21 2200  azithromycin (ZITHROMAX) 500 mg in sodium chloride 0.9 % 250 mL IVPB  Status:  Discontinued        500 mg 250 mL/hr over 60 Minutes Intravenous Every 24 hours 06/25/21 0049 06/26/21 1027   06/24/21 2145  cefTRIAXone (ROCEPHIN) 1 g in sodium chloride 0.9 % 100 mL IVPB        1 g 200 mL/hr over 30 Minutes Intravenous  Once 06/24/21 2144 06/24/21 2239   06/24/21 2145  azithromycin (ZITHROMAX) 500 mg in sodium chloride 0.9 % 250 mL IVPB        500 mg 250 mL/hr over 60 Minutes Intravenous  Once 06/24/21 2144 06/24/21 2322       Objective:  Vital Signs  Vitals:   06/26/21 1715 06/26/21 2158 06/27/21 0137 06/27/21 0545  BP: (!) 151/66 131/63 128/64 125/60  Pulse: 65 64 65 (!) 58  Resp: 16  15   Temp: 98.9 F (37.2 C) 98.7 F (37.1 C) 98.9 F (37.2 C) 98.2 F (36.8 C)  TempSrc: Oral Oral Oral Oral  SpO2: 97% 97% 95%   Weight:      Height:        Intake/Output Summary (Last 24 hours) at 06/27/2021 0754 Last data filed at 06/27/2021 0500 Gross per 24 hour  Intake 1007.03 ml  Output 1300 ml  Net -292.97 ml     Filed Weights   06/24/21 2037 06/25/21 0900  Weight: 125.6 kg 120.4 kg    General:  Pleasantly resting in bed, No acute distress. HEENT:  Normocephalic atraumatic.  Sclerae nonicteric, noninjected.  Extraocular movements intact bilaterally. Neck:  Without mass or deformity.  Trachea is midline. Lungs: Diminished bilaterally without overt wheezes rhonchi or rales Heart:  Regular rate and rhythm.  Without murmurs, rubs, or gallops. Abdomen:  Soft, nontender, nondistended.  Without guarding or rebound. Extremities: Without cyanosis, clubbing, edema, or obvious deformity. Vascular:  Dorsalis pedis and posterior tibial pulses palpable bilaterally. Skin:  Warm and dry, no erythema, no  ulcerations.  Lab Results:  Data Reviewed: I have personally reviewed following labs and imaging studies  CBC: Recent Labs  Lab 06/24/21 2043 06/25/21 0110 06/26/21 0253 06/27/21 0440  WBC 11.5* 9.9 6.7 5.9  NEUTROABS 9.6*  --   --   --   HGB 10.7* 8.0* 9.9* 9.9*  HCT 38.6* 29.3* 34.8* 35.8*  MCV 71.9* 74.0* 72.2* 72.6*  PLT 235 171 205 220     Basic Metabolic Panel: Recent Labs  Lab 06/24/21 2043 06/25/21 0110 06/26/21 0253 06/27/21 0440  NA 141 139 137 136  K 4.6 4.6 4.5 4.5  CL 101 98 100 98  CO2 34* 32 30 31  GLUCOSE 226* 223* 193* 157*  BUN 30* 31* 22 17  CREATININE 1.32* 1.30* 0.95 0.85  CALCIUM 8.7* 8.4* 8.2* 8.4*     GFR: Estimated Creatinine Clearance: 121.2 mL/min (by C-G formula based on SCr of 0.85 mg/dL).  Liver Function Tests: Recent Labs  Lab 06/24/21 2043 06/25/21 0110  AST 18 18  ALT 17 17  ALKPHOS 83 82  BILITOT 0.5 0.5  PROT 7.3 7.3  ALBUMIN 2.9* 2.9*      HbA1C: Recent Labs    06/25/21 0110  HGBA1C 8.4*     CBG: Recent Labs  Lab 06/25/21 1622 06/25/21 2127 06/26/21 0805 06/26/21 1152 06/26/21 2157  GLUCAP 236* 216* 172* 158* 255*     Anemia Panel: Recent Labs    06/25/21 0110  TIBC 262  IRON 17*     Recent Results  (from the past 240 hour(s))  Resp Panel by RT-PCR (Flu A&B, Covid) Nasopharyngeal Swab     Status: None   Collection Time: 06/24/21  8:45 PM   Specimen: Nasopharyngeal Swab; Nasopharyngeal(NP) swabs in vial transport medium  Result Value Ref Range Status   SARS Coronavirus 2 by RT PCR NEGATIVE NEGATIVE Final    Comment: (NOTE) SARS-CoV-2 target nucleic acids are NOT DETECTED.  The SARS-CoV-2 RNA is generally detectable in upper respiratory specimens during the acute phase of infection. The lowest concentration of SARS-CoV-2 viral copies this assay can detect is 138 copies/mL. A negative result does not preclude SARS-Cov-2 infection and should not be used as the sole basis for treatment or other patient management decisions. A negative result may occur with  improper specimen collection/handling, submission of specimen other than nasopharyngeal swab, presence of viral mutation(s) within the areas targeted by this assay, and inadequate number of viral copies(<138 copies/mL). A negative result must be combined with clinical observations, patient history, and epidemiological information. The expected result is Negative.  Fact Sheet for Patients:  EntrepreneurPulse.com.au  Fact Sheet for Healthcare Providers:  IncredibleEmployment.be  This test is no t yet approved or cleared by the [place] FDA and  has been authorized for detection and/or diagnosis of SARS-CoV-2 by FDA under an Emergency Use Authorization (EUA). This EUA will remain  in effect (meaning this test can be used) for the duration of the COVID-19 declaration under Section 564(b)(1) of the Act, 21 U.S.C.section 360bbb-3(b)(1), unless the authorization is terminated  or revoked sooner.       Influenza A by PCR NEGATIVE NEGATIVE Final   Influenza B by PCR NEGATIVE NEGATIVE Final    Comment: (NOTE) The Xpert Xpress SARS-CoV-2/FLU/RSV plus assay is intended as an aid in the  diagnosis of influenza from Nasopharyngeal swab specimens and should not be used as a sole basis for treatment. Nasal washings and aspirates are unacceptable for Xpert Xpress SARS-CoV-2/FLU/RSV testing.  Fact Sheet  for Patients: EntrepreneurPulse.com.au  Fact Sheet for Healthcare Providers: IncredibleEmployment.be  This test is not yet approved or cleared by the [place] FDA and has been authorized for detection and/or diagnosis of SARS-CoV-2 by FDA under an Emergency Use Authorization (EUA). This EUA will remain in effect (meaning this test can be used) for the duration of the COVID-19 declaration under Section 564(b)(1) of the Act, 21 U.S.C. section 360bbb-3(b)(1), unless the authorization is terminated or revoked.  Performed at [facility], [place] [address], [place], [place] [address]   MRSA Next Gen by PCR, Nasal     Status: Abnormal   Collection Time: 06/25/21 12:49 AM   Specimen: Nasal Mucosa; Nasal Swab  Result Value Ref Range Status   MRSA by PCR Next Gen DETECTED (A) NOT DETECTED Final    Comment: CRITICAL RESULT CALLED TO, READ BACK BY AND VERIFIED WITH: HARRIS, K. RN ON 06/25/2021 @ 0640 BY MECIAL J. (NOTE) The GeneXpert MRSA Assay (FDA approved for NASAL specimens only), is one component of a comprehensive MRSA colonization surveillance program. It is not intended to diagnose MRSA infection nor to guide or monitor treatment for MRSA infections. Test performance is not FDA approved in patients less than 41 years old. Performed at [facility], [place] [address], [place], [place] [address]   Respiratory (~20 pathogens) panel by PCR     Status: None   Collection Time: 06/25/21 12:49 AM   Specimen: Nasal Mucosa; Respiratory  Result Value Ref Range Status   Adenovirus NOT DETECTED NOT DETECTED Final   Coronavirus 229E NOT DETECTED NOT DETECTED Final    Comment: (NOTE) The Coronavirus on the  Respiratory Panel, DOES NOT test for the novel  Coronavirus (2019 nCoV)    Coronavirus HKU1 NOT DETECTED NOT DETECTED Final   Coronavirus NL63 NOT DETECTED NOT DETECTED Final   Coronavirus OC43 NOT DETECTED NOT DETECTED Final   Metapneumovirus NOT DETECTED NOT DETECTED Final   Rhinovirus / Enterovirus NOT DETECTED NOT DETECTED Final   Influenza A NOT DETECTED NOT DETECTED Final   Influenza B NOT DETECTED NOT DETECTED Final   Parainfluenza Virus 1 NOT DETECTED NOT DETECTED Final   Parainfluenza Virus 2 NOT DETECTED NOT DETECTED Final   Parainfluenza Virus 3 NOT DETECTED NOT DETECTED Final   Parainfluenza Virus 4 NOT DETECTED NOT DETECTED Final   Respiratory Syncytial Virus NOT DETECTED NOT DETECTED Final   Bordetella pertussis NOT DETECTED NOT DETECTED Final   Bordetella Parapertussis NOT DETECTED NOT DETECTED Final   Chlamydophila pneumoniae NOT DETECTED NOT DETECTED Final   Mycoplasma pneumoniae NOT DETECTED NOT DETECTED Final    Comment: Performed at [facility], [place]. [address], [place], [place] [address]       Radiology Studies: No results found.     LOS: 2 days   Little Ishikawa  [facility] Pager on [email or web address]  06/27/2021, 7:54 AM

## 2021-06-27 NOTE — Progress Notes (Signed)
Pt refused nocturnal bipap, remains on 2l [place].  Pt is awake, alert, no increased wob noted.  RN aware.  RN to advise this Probation officer should pt change his mind or increased confusion or wob noted.  Bipap remains in room on standby.

## 2021-06-27 NOTE — Progress Notes (Signed)
Orthopedic Tech Progress Note Patient Details:  [name] Sep 08, 1956 [id number]  Patient ID: Sukhraj Esquivias, male   DOB: 1956/03/14, 65 y.o.   MRN: [id number]  Kennis Carina 06/27/2021, 4:43 PM Bi lat unna boot applied

## 2021-06-27 NOTE — TOC Initial Note (Signed)
Transition of Care [facility]) - Initial/Assessment Note    Patient Details  Name: [name] MRN: [id number] Date of Birth: [birth year]  Transition of Care [facility] CM/SW Contact:    Dessa Phi, RN Phone Number: 06/27/2021, 10:32 AM  Clinical Narrative: Spoke to [name] rep Kristal-from LTC-patient in agreement to return.                  Expected Discharge Plan: Long Term Nursing Home Barriers to Discharge: Continued Medical Work up   Patient Goals and CMS Choice Patient states their goals for this hospitalization and ongoing recovery are:: return back to [facility] Medicare.gov Compare Post Acute Care list provided to:: Patient Choice offered to / list presented to : Patient  Expected Discharge Plan and Services Expected Discharge Plan: [place]   Discharge Planning Services: CM Consult   Living arrangements for the past 2 months:  (LTC facility)                                      Prior Living Arrangements/Services Living arrangements for the past 2 months:  (LTC facility) Lives with:: Facility Resident Patient language and need for interpreter reviewed:: Yes Do you feel safe going back to the place where you live?: Yes      Need for Family Participation in Patient Care: No (Comment) Care giver support system in place?: Yes (comment)   Criminal Activity/Legal Involvement Pertinent to Current Situation/Hospitalization: No - Comment as needed  Activities of Daily Living Home Assistive Devices/Equipment: Blood pressure cuff, Grab bars around toilet, Grab bars in shower, Hand-held shower hose, Hospital bed, [employer], Environmental consultant (specify type), Wheelchair, Scales, Other (Comment) (front wheeled walker, Retail buyer) ADL Screening (condition at time of admission) Patient's cognitive ability adequate to safely complete daily activities?: Yes Is the patient deaf or have difficulty hearing?: No Does the patient have difficulty seeing, even when  wearing glasses/contacts?: No Does the patient have difficulty concentrating, remembering, or making decisions?: No Patient able to express need for assistance with ADLs?: Yes Does the patient have difficulty dressing or bathing?: Yes Independently performs ADLs?: No (secondary to weakness) Communication: Independent Dressing (OT): Needs assistance Is this a change from baseline?: Pre-admission baseline Grooming: Needs assistance Is this a change from baseline?: Pre-admission baseline Feeding: Needs assistance Is this a change from baseline?: Pre-admission baseline Bathing: Needs assistance Is this a change from baseline?: Pre-admission baseline Toileting: Needs assistance Is this a change from baseline?: Pre-admission baseline In/Out Bed: Needs assistance Is this a change from baseline?: Pre-admission baseline Walks in Home: Needs assistance Is this a change from baseline?: Pre-admission baseline Does the patient have difficulty walking or climbing stairs?: Yes (secondary to weakness) Weakness of Legs: Both Weakness of Arms/Hands: None  Permission Sought/Granted Permission sought to share information with : Case Manager Permission granted to share information with : Yes, Verbal Permission Granted  Share Information with NAME: Case manager           Emotional Assessment Appearance:: Appears stated age Attitude/Demeanor/Rapport: Gracious Affect (typically observed): Accepting Orientation: : Oriented to Place, Oriented to Self, Oriented to  Time, Oriented to Situation Alcohol / Substance Use: Not Applicable Psych Involvement: No (comment)  Admission diagnosis:  CAP (community acquired pneumonia) [J18.9] Acute on chronic respiratory failure with hypoxia (HCC) [J96.21] Patient Active Problem List   Diagnosis Date Noted   Acute on chronic respiratory failure with  hypoxia and hypercapnia ([place]) 06/25/2021   CAP (community acquired pneumonia) 06/25/2021   Chronic diastolic CHF  (congestive heart failure) ([place]) 06/25/2021   Benign prostatic hyperplasia with urinary obstruction 08/29/2020   Overactive bladder 08/29/2020   Type II diabetes mellitus, uncontrolled 08/29/2020   Impotence 08/29/2020   Urge incontinence of urine 08/29/2020   Chronic respiratory failure with hypoxia ([place]) 04/06/2019   Dyslipidemia 04/06/2019   Hypomagnesemia 04/06/2019   MDD (major depressive disorder) 04/06/2019   Psychosis ([place]) 04/05/2019   Dyspnea 02/12/2019   Pain in left wrist 23/76/2831   Diastolic dysfunction 51/76/1607   Primary skin malignancy with unknown cell type 37/06/6268   Acute diastolic heart failure ([place]) 08/25/2016   Hypoxia 08/21/2016   Acute respiratory failure with hypoxia and hypercapnia (HCC) 08/21/2016   Moderate protein-calorie malnutrition ([place]) 08/21/2016   Stasis edema of both lower extremities 08/21/2016   Chronic pain 08/21/2016   Tobacco use disorder 02/23/2016   Venous stasis syndrome 02/09/2016   Spondylosis without myelopathy or radiculopathy, lumbar region 12/13/2015   AKI (acute kidney injury) ([place]) 11/02/2015   Hyperkalemia 11/02/2015   Hypoglycemia 11/02/2015   Cellulitis    Leg ulcer ([place])    Nonhealing ulcer of left lower extremity ([place])    Pain of left leg    Cellulitis of leg 04/25/2015   Degenerative arthritis of hip 04/25/2015   Cellulitis and abscess of leg    Microcytic anemia 03/27/2015   Hypertension    Fever    DM2 (diabetes mellitus, type 2) ([place]) 03/20/2015   MSSA (methicillin susceptible Staphylococcus aureus) infection 03/20/2015   Leukocytosis 03/20/2015   Anemia, iron deficiency 03/20/2015   Cellulitis of left lower extremity    Pyomyositis    Systemic infection ([place]) 03/14/2015   PCP:  Charlsie Merles, MD Pharmacy:   [place], [place] - [address] [place] [address] Phone: [phone] Fax: [phone]     Social Determinants of Health (SDOH)  Interventions    Readmission Risk Interventions No flowsheet data found.

## 2021-06-28 LAB — CBC
HCT: 36.5 % — ABNORMAL LOW (ref 39.0–52.0)
Hemoglobin: 10.2 g/dL — ABNORMAL LOW (ref 13.0–17.0)
MCH: 20.1 pg — ABNORMAL LOW (ref 26.0–34.0)
MCHC: 27.9 g/dL — ABNORMAL LOW (ref 30.0–36.0)
MCV: 71.9 fL — ABNORMAL LOW (ref 80.0–100.0)
Platelets: 226 10*3/uL (ref 150–400)
RBC: 5.08 MIL/uL (ref 4.22–5.81)
RDW: 21.1 % — ABNORMAL HIGH (ref 11.5–15.5)
WBC: 4.7 10*3/uL (ref 4.0–10.5)
nRBC: 0 % (ref 0.0–0.2)

## 2021-06-28 LAB — BASIC METABOLIC PANEL
Anion gap: 5 (ref 5–15)
BUN: 15 mg/dL (ref 8–23)
CO2: 33 mmol/L — ABNORMAL HIGH (ref 22–32)
Calcium: 9.2 mg/dL (ref 8.9–10.3)
Chloride: 105 mmol/L (ref 98–111)
Creatinine, Ser: 0.78 mg/dL (ref 0.61–1.24)
GFR, Estimated: >60 mL/min (ref >60)
Glucose, Bld: 187 mg/dL — ABNORMAL HIGH (ref 70–99)
Potassium: 4.4 mmol/L (ref 3.5–5.1)
Sodium: 143 mmol/L (ref 135–145)

## 2021-06-28 LAB — GLUCOSE, CAPILLARY
Glucose-Capillary: 153 mg/dL — ABNORMAL HIGH (ref 70–99)
Glucose-Capillary: 161 mg/dL — ABNORMAL HIGH (ref 70–99)
Glucose-Capillary: 171 mg/dL — ABNORMAL HIGH (ref 70–99)
Glucose-Capillary: 270 mg/dL — ABNORMAL HIGH (ref 70–99)

## 2021-06-28 NOTE — Progress Notes (Signed)
Patient continues to decline nocturnal BiPAP. Equipment remains on standby at the bedside. He is aware that he may request RT assistance if he should change his mind at any time.

## 2021-06-28 NOTE — Care Management Important Message (Signed)
Important Message  Patient Details IM Letter given to the Patient. Name: [name] MRN: [id number] Date of Birth: 02/26/1956   Medicare Important Message Given:  Yes     Kerin Salen 06/28/2021, 1:15 PM

## 2021-06-28 NOTE — TOC Initial Note (Signed)
Transition of Care [facility]) - Initial/Assessment Note    Patient Details  Name: [name] MRN: [id number] Date of Birth: October 27, 1955  Transition of Care [facility]) CM/SW Contact:    Dessa Phi, RN Phone Number: 06/28/2021, 4:15 PM  Clinical Narrative: faxed to Myrtie Hawk to return tomorrow if stable.                  Expected Discharge Plan: Long Term Nursing Home Barriers to Discharge: Continued Medical Work up   Patient Goals and CMS Choice Patient states their goals for this hospitalization and ongoing recovery are:: return back to [place] Medicare.gov Compare Post Acute Care list provided to:: Patient Choice offered to / list presented to : Patient  Expected Discharge Plan and Services Expected Discharge Plan: [place]   Discharge Planning Services: CM Consult   Living arrangements for the past 2 months:  (LTC facility)                                      Prior Living Arrangements/Services Living arrangements for the past 2 months:  (LTC facility) Lives with:: Facility Resident Patient language and need for interpreter reviewed:: Yes Do you feel safe going back to the place where you live?: Yes      Need for Family Participation in Patient Care: No (Comment) Care giver support system in place?: Yes (comment)   Criminal Activity/Legal Involvement Pertinent to Current Situation/Hospitalization: No - Comment as needed  Activities of Daily Living Home Assistive Devices/Equipment: Blood pressure cuff, Grab bars around toilet, Grab bars in shower, Hand-held shower hose, Hospital bed, [employer], Environmental consultant (specify type), Wheelchair, Scales, Other (Comment) (front wheeled walker, Retail buyer) ADL Screening (condition at time of admission) Patient's cognitive ability adequate to safely complete daily activities?: Yes Is the patient deaf or have difficulty hearing?: No Does the patient have difficulty seeing, even when wearing  glasses/contacts?: No Does the patient have difficulty concentrating, remembering, or making decisions?: No Patient able to express need for assistance with ADLs?: Yes Does the patient have difficulty dressing or bathing?: Yes Independently performs ADLs?: No (secondary to weakness) Communication: Independent Dressing (OT): Needs assistance Is this a change from baseline?: Pre-admission baseline Grooming: Needs assistance Is this a change from baseline?: Pre-admission baseline Feeding: Needs assistance Is this a change from baseline?: Pre-admission baseline Bathing: Needs assistance Is this a change from baseline?: Pre-admission baseline Toileting: Needs assistance Is this a change from baseline?: Pre-admission baseline In/Out Bed: Needs assistance Is this a change from baseline?: Pre-admission baseline Walks in Home: Needs assistance Is this a change from baseline?: Pre-admission baseline Does the patient have difficulty walking or climbing stairs?: Yes (secondary to weakness) Weakness of Legs: Both Weakness of Arms/Hands: None  Permission Sought/Granted Permission sought to share information with : Case Manager Permission granted to share information with : Yes, Verbal Permission Granted  Share Information with NAME: Case manager           Emotional Assessment Appearance:: Appears stated age Attitude/Demeanor/Rapport: Gracious Affect (typically observed): Accepting Orientation: : Oriented to Place, Oriented to Self, Oriented to  Time, Oriented to Situation Alcohol / Substance Use: Not Applicable Psych Involvement: No (comment)  Admission diagnosis:  CAP (community acquired pneumonia) [J18.9] Acute on chronic respiratory failure with hypoxia (HCC) [J96.21] Patient Active Problem List   Diagnosis Date Noted   Acute on chronic respiratory failure with hypoxia and  hypercapnia ([place]) 06/25/2021   CAP (community acquired pneumonia) 06/25/2021   Chronic diastolic CHF  (congestive heart failure) ([place]) 06/25/2021   Benign prostatic hyperplasia with urinary obstruction 08/29/2020   Overactive bladder 08/29/2020   Type II diabetes mellitus, uncontrolled 08/29/2020   Impotence 08/29/2020   Urge incontinence of urine 08/29/2020   Chronic respiratory failure with hypoxia ([place]) 04/06/2019   Dyslipidemia 04/06/2019   Hypomagnesemia 04/06/2019   MDD (major depressive disorder) 04/06/2019   Psychosis ([place]) 04/05/2019   Dyspnea 02/12/2019   Pain in left wrist 16/06/9603   Diastolic dysfunction 54/05/8118   Primary skin malignancy with unknown cell type 14/78/2956   Acute diastolic heart failure ([place]) 08/25/2016   Hypoxia 08/21/2016   Acute respiratory failure with hypoxia and hypercapnia (HCC) 08/21/2016   Moderate protein-calorie malnutrition ([place]) 08/21/2016   Stasis edema of both lower extremities 08/21/2016   Chronic pain 08/21/2016   Tobacco use disorder 02/23/2016   Venous stasis syndrome 02/09/2016   Spondylosis without myelopathy or radiculopathy, lumbar region 12/13/2015   AKI (acute kidney injury) ([place]) 11/02/2015   Hyperkalemia 11/02/2015   Hypoglycemia 11/02/2015   Cellulitis    Leg ulcer ([place])    Nonhealing ulcer of left lower extremity ([place])    Pain of left leg    Cellulitis of leg 04/25/2015   Degenerative arthritis of hip 04/25/2015   Cellulitis and abscess of leg    Microcytic anemia 03/27/2015   Hypertension    Fever    DM2 (diabetes mellitus, type 2) ([place]) 03/20/2015   MSSA (methicillin susceptible Staphylococcus aureus) infection 03/20/2015   Leukocytosis 03/20/2015   Anemia, iron deficiency 03/20/2015   Cellulitis of left lower extremity    Pyomyositis    Systemic infection ([place]) 03/14/2015   PCP:  Charlsie Merles, MD Pharmacy:   [place], [place] - [address] [place] [address] Phone: [phone] Fax: [phone]     Social Determinants of Health (SDOH)  Interventions    Readmission Risk Interventions No flowsheet data found.

## 2021-06-28 NOTE — Plan of Care (Signed)
  Problem: Education: Goal: Knowledge of General Education information will improve Description: Including pain rating scale, medication(s)/side effects and non-pharmacologic comfort measures Outcome: Completed/Met

## 2021-06-28 NOTE — NC FL2 (Signed)
[place] MEDICAID FL2 LEVEL OF CARE SCREENING TOOL     IDENTIFICATION  Patient Name: [name] Birthdate: February 28, 1956 Sex: male Admission Date (Current Location): 06/24/2021  [place] and [place] Number:  Kathleen Argue [id number] [place] and Address:  [facility],  [place], [place]      Provider Number: [id number]  Attending Physician Name and Address:  Little Ishikawa, MD  Relative Name and Phone Number:  Rasul Decola spouse [phone]    Current Level of Care: Hospital Recommended Level of Care: Nursing Facility Prior Approval Number:    Date Approved/Denied:   PASRR Number:    Discharge Plan: SNF    Current Diagnoses: Patient Active Problem List   Diagnosis Date Noted   Acute on chronic respiratory failure with hypoxia and hypercapnia ([place]) 06/25/2021   CAP (community acquired pneumonia) 06/25/2021   Chronic diastolic CHF (congestive heart failure) ([place]) 06/25/2021   Benign prostatic hyperplasia with urinary obstruction 08/29/2020   Overactive bladder 08/29/2020   Type II diabetes mellitus, uncontrolled 08/29/2020   Impotence 08/29/2020   Urge incontinence of urine 08/29/2020   Chronic respiratory failure with hypoxia ([place]) 04/06/2019   Dyslipidemia 04/06/2019   Hypomagnesemia 04/06/2019   MDD (major depressive disorder) 04/06/2019   Psychosis ([place]) 04/05/2019   Dyspnea 02/12/2019   Pain in left wrist 06/15/3006   Diastolic dysfunction 62/26/3335   Primary skin malignancy with unknown cell type 45/62/5638   Acute diastolic heart failure ([place]) 08/25/2016   Hypoxia 08/21/2016   Acute respiratory failure with hypoxia and hypercapnia (HCC) 08/21/2016   Moderate protein-calorie malnutrition ([place]) 08/21/2016   Stasis edema of both lower extremities 08/21/2016   Chronic pain 08/21/2016   Tobacco use disorder 02/23/2016   Venous stasis syndrome 02/09/2016   Spondylosis without myelopathy or radiculopathy, lumbar region 12/13/2015    AKI (acute kidney injury) ([place]) 11/02/2015   Hyperkalemia 11/02/2015   Hypoglycemia 11/02/2015   Cellulitis    Leg ulcer ([place])    Nonhealing ulcer of left lower extremity ([place])    Pain of left leg    Cellulitis of leg 04/25/2015   Degenerative arthritis of hip 04/25/2015   Cellulitis and abscess of leg    Microcytic anemia 03/27/2015   Hypertension    Fever    DM2 (diabetes mellitus, type 2) ([place]) 03/20/2015   MSSA (methicillin susceptible Staphylococcus aureus) infection 03/20/2015   Leukocytosis 03/20/2015   Anemia, iron deficiency 03/20/2015   Cellulitis of left lower extremity    Pyomyositis    Systemic infection ([place]) 03/14/2015    Orientation RESPIRATION BLADDER Height & Weight     Self, Time, Situation, Place  O2 Continent Weight: 120.4 kg Height:  6' 5"  (195.6 cm)  BEHAVIORAL SYMPTOMS/MOOD NEUROLOGICAL BOWEL NUTRITION STATUS      Continent Diet (CHO MOD)  AMBULATORY STATUS COMMUNICATION OF NEEDS Skin   Total Care Verbally Normal                       Personal Care Assistance Level of Assistance  Bathing, Feeding, Dressing, Total care Bathing Assistance: Maximum assistance Feeding assistance: Maximum assistance Dressing Assistance: Maximum assistance Total Care Assistance: Maximum assistance   Functional Limitations Info  Sight, Hearing, Speech Sight Info: Adequate Hearing Info: Adequate Speech Info: Adequate    SPECIAL CARE FACTORS FREQUENCY                       Contractures Contractures Info: Not present  Additional Factors Info  Code Status, Allergies Code Status Info:  (Full) Allergies Info:  (Sulfa;antibiotics)           Current Medications (06/28/2021):  This is the current hospital active medication list Current Facility-Administered Medications  Medication Dose Route Frequency Provider Last Rate Last Admin   acetaminophen (TYLENOL) tablet 650 mg  650 mg Oral Q6H PRN Etta Quill, DO   650 mg at 06/27/21 2223    ARIPiprazole (ABILIFY) tablet 2 mg  2 mg Oral QHS Jennette Kettle M, DO   2 mg at 06/27/21 2208   aspirin EC tablet 81 mg  81 mg Oral Daily Etta Quill, DO   81 mg at 06/28/21 1118   azithromycin (ZITHROMAX) tablet 500 mg  500 mg Oral Daily Dimple Nanas, RPH   500 mg at 06/27/21 1751   cefdinir (OMNICEF) capsule 300 mg  300 mg Oral Q12H Bonnielee Haff, MD   300 mg at 06/28/21 1120   chlorhexidine gluconate (MEDLINE KIT) (PERIDEX) 0.12 % solution 5 mL  5 mL Mouth/Throat BID Etta Quill, DO   5 mL at 06/27/21 2209   Chlorhexidine Gluconate Cloth 2 % PADS 6 each  6 each Topical Daily Bonnielee Haff, MD   6 each at 06/28/21 1120   Chlorhexidine Gluconate Cloth 2 % PADS 6 each  6 each Topical Q0600 Bonnielee Haff, MD   6 each at 06/28/21 0519   cholecalciferol (VITAMIN D) tablet 2,000 Units  2,000 Units Oral Daily Jennette Kettle M, DO   2,000 Units at 06/28/21 1118   enoxaparin (LOVENOX) injection 40 mg  40 mg Subcutaneous Q24H Jennette Kettle M, DO   40 mg at 06/28/21 1119   ferrous sulfate tablet 325 mg  325 mg Oral Q breakfast Etta Quill, DO   325 mg at 06/28/21 1118   gabapentin (NEURONTIN) capsule 300 mg  300 mg Oral TID Etta Quill, DO   300 mg at 06/28/21 1119   hydrALAZINE (APRESOLINE) injection 5 mg  5 mg Intravenous Q6H PRN Bonnielee Haff, MD   5 mg at 06/26/21 1408   insulin aspart (novoLOG) injection 0-15 Units  0-15 Units Subcutaneous TID WC Etta Quill, DO   3 Units at 06/28/21 1228   insulin glargine-yfgn (SEMGLEE) injection 25 Units  25 Units Subcutaneous BID Etta Quill, DO   25 Units at 06/28/21 1119   ipratropium-albuterol (DUONEB) 0.5-2.5 (3) MG/3ML nebulizer solution 3 mL  3 mL Nebulization Q6H PRN Bonnielee Haff, MD       lisinopril (ZESTRIL) tablet 2.5 mg  2.5 mg Oral Daily Bonnielee Haff, MD   2.5 mg at 06/28/21 1118   MEDLINE mouth rinse  15 mL Mouth Rinse BID Bonnielee Haff, MD   15 mL at 06/27/21 2210   melatonin tablet 3 mg  3 mg Oral  QHS Etta Quill, DO   3 mg at 06/27/21 2208   multivitamin with minerals tablet 1 tablet  1 tablet Oral Daily Etta Quill, DO   1 tablet at 06/28/21 1119   mupirocin ointment (BACTROBAN) 2 % 1 application  1 application Nasal BID Bonnielee Haff, MD   1 application at 58/85/02 1121   sodium hypochlorite (DAKIN'S 1/4 STRENGTH) topical solution   Irrigation Daily Bonnielee Haff, MD   Given at 06/27/21 1658   tiZANidine (ZANAFLEX) tablet 4 mg  4 mg Oral Q8H PRN Etta Quill, DO       traZODone (DESYREL) tablet 25 mg  25  mg Oral QHS PRN Etta Quill, DO       Vilazodone HCl TABS 40 mg  40 mg Oral Daily Alphonzo Cruise, MD   40 mg at 06/28/21 1118     Discharge Medications: Please see discharge summary for a list of discharge medications.  Relevant Imaging Results:  Relevant Lab Results:   Additional Information SS#: [id number]  Taytum Wheller, Juliann Pulse, Therapist, sports

## 2021-06-28 NOTE — Progress Notes (Signed)
[facility] PROGRESS NOTE   [name] VVZ:[id number] DOB: May 11, 1956 DOA: 06/24/2021  PCP: Charlsie Merles, MD  Brief History/Interval Summary: 65 y.o. male with medical history significant of dCHF, HTN, DM, chronic hypoxic and hypercapnic resp failure on 2L O2 at baseline.  Patient reports that he is a former smoker.  Patient living in a skilled nursing facility.  Patient was treated for pneumonia about a month ago.  Complained of shortness of breath.  Was found to have saturations in the 70s on 2 L at the skilled nursing facility.  Patient was brought into the hospital.  He was noted to be hypercapnic.  He was placed on BiPAP.  Consultants: None at this time  Procedures: None  Subjective/Interval History: No acute issues or events overnight, patient refused BiPAP overnight again stating "I did not need it" otherwise denies chest pain nausea vomiting diarrhea constipation headache fevers chills  Assessment/Plan:  Acute on chronic respiratory failure with hypoxia and hypercapnia -Initially placed on BiPAP, able to wean to nasal cannula at this point  -Continue as needed BiPAP as needed/nightly  -2L [place] oxygen at baseline but patient reports wearing it 'as needed' without any clear delineation of rest/active/sleep usage. -Continues to have markedly profound symptoms with even minimal exertion, unable to ambulate in any meaningful way today with PT or nursing staff in the room even for simple oxygenation screen  Community-acquired pneumonia, POA, improving -Likely viral although cannot rule out bacterial pneumonia continue 5-day course of antibiotics currently on cefdinir -COVID reassuringly negative, viral panel also negative -Chest x-ray unremarkable although CT showed questionable bibasilar atelectasis  Acute kidney injury, resolved -Likely poor p.o. intake exacerbated by diuretics -Creatinine currently within normal limits after completing IV fluids  Chronic diastolic  CHF, no acute exacerbation -Monitor closely in the setting of diuretic cessation and IV fluids as above -IV fluids not discontinued, continue to follow clinically, resume diuretics in the next 24 to 48 hours or at discharge  Diabetes mellitus type 2, uncontrolled with hyperglycemia HbA1c 8.4. Continue Lantus, sliding scale insulin, diabetic diet and hypoglycemic protocol  Iron deficiency anemia Drop in hemoglobin is likely dilutional.  Stable for the most part.  No overt bleeding noted.  Continue iron supplements.  History of depression and other unspecified psychiatric illness Noted to be on Abilify as well as the lurasidone.  These have been continued.  Chronic pain Continue Neurontin.  Bilateral lower extremity ulcers Both legs are covered in [employer].  He tells me that this is changed to 3 times a week.  Seen by wound care nurse  Obesity Estimated body mass index is 31.48 kg/m as calculated from the following:   Height as of this encounter: 6' 5"  (1.956 m).   Weight as of this encounter: 120.4 kg.    DVT Prophylaxis: Lovenox Code Status: Full code Family Communication: Discussed with the patient.  No family available Disposition Plan: PT/ OT evaluation.  Hopefully return to SNF in 24 to 48 hours.  Status is: Inpatient  Remains inpatient appropriate because:IV treatments appropriate due to intensity of illness or inability to take PO and Inpatient level of care appropriate due to severity of illness  Dispo: The patient is from: SNF              Anticipated d/c is to: SNF              Patient currently is not medically stable to d/c. Given ongoing symptoms/dypnea and ambulatory dysfunction   Difficult to  place patient No   Medications: Scheduled:  ARIPiprazole  2 mg Oral QHS   aspirin EC  81 mg Oral Daily   azithromycin  500 mg Oral Daily   cefdinir  300 mg Oral Q12H   chlorhexidine gluconate (MEDLINE KIT)  5 mL Mouth/Throat BID   Chlorhexidine Gluconate Cloth  6  each Topical Daily   Chlorhexidine Gluconate Cloth  6 each Topical Q0600   cholecalciferol  2,000 Units Oral Daily   enoxaparin (LOVENOX) injection  40 mg Subcutaneous Q24H   ferrous sulfate  325 mg Oral Q breakfast   gabapentin  300 mg Oral TID   insulin aspart  0-15 Units Subcutaneous TID WC   insulin glargine-yfgn  25 Units Subcutaneous BID   lisinopril  2.5 mg Oral Daily   mouth rinse  15 mL Mouth Rinse BID   melatonin  3 mg Oral QHS   multivitamin with minerals  1 tablet Oral Daily   mupirocin ointment  1 application Nasal BID   sodium hypochlorite   Irrigation Daily   Vilazodone HCl  40 mg Oral Daily   Continuous:   DZH:[id number], hydrALAZINE, ipratropium-albuterol, tiZANidine, traZODone  Antibiotics: Anti-infectives (From admission, onward)    Start     Dose/Rate Route Frequency Ordered Stop   06/26/21 2200  cefdinir (OMNICEF) capsule 300 mg        300 mg Oral Every 12 hours 06/26/21 1143 06/30/21 2159   06/26/21 1700  azithromycin (ZITHROMAX) tablet 500 mg        500 mg Oral Daily 06/26/21 1027 06/30/21 1659   06/25/21 2200  cefTRIAXone (ROCEPHIN) 2 g in sodium chloride 0.9 % 100 mL IVPB  Status:  Discontinued        2 g 200 mL/hr over 30 Minutes Intravenous Every 24 hours 06/25/21 0049 06/26/21 1143   06/25/21 2200  azithromycin (ZITHROMAX) 500 mg in sodium chloride 0.9 % 250 mL IVPB  Status:  Discontinued        500 mg 250 mL/hr over 60 Minutes Intravenous Every 24 hours 06/25/21 0049 06/26/21 1027   06/24/21 2145  cefTRIAXone (ROCEPHIN) 1 g in sodium chloride 0.9 % 100 mL IVPB        1 g 200 mL/hr over 30 Minutes Intravenous  Once 06/24/21 2144 06/24/21 2239   06/24/21 2145  azithromycin (ZITHROMAX) 500 mg in sodium chloride 0.9 % 250 mL IVPB        500 mg 250 mL/hr over 60 Minutes Intravenous  Once 06/24/21 2144 06/24/21 2322       Objective:  Vital Signs  Vitals:   06/27/21 1023 06/27/21 1333 06/27/21 2141 06/28/21 0529  BP: 134/63 (!) 146/60 (!)  130/55 139/68  Pulse:  65 63 (!) 54  Resp:  20 16 18   Temp:  98.3 F (36.8 C) 98.3 F (36.8 C) 98.2 F (36.8 C)  TempSrc:  Oral  Oral  SpO2:  93% 94% 93%  Weight:      Height:        Intake/Output Summary (Last 24 hours) at 06/28/2021 0751 Last data filed at 06/27/2021 1659 Gross per 24 hour  Intake 120 ml  Output 950 ml  Net -830 ml    Filed Weights   06/24/21 2037 06/25/21 0900  Weight: 125.6 kg 120.4 kg    General:  Pleasantly resting in bed, No acute distress. HEENT:  Normocephalic atraumatic.  Sclerae nonicteric, noninjected.  Extraocular movements intact bilaterally. Neck:  Without mass or deformity.  Trachea is midline. Lungs:  Diminished bilaterally without overt wheezes rhonchi or rales - good inspiratory effort Heart:  Regular rate and rhythm.  Without murmurs, rubs, or gallops. Abdomen:  Soft, nontender, nondistended.  Without guarding or rebound. Extremities: Without cyanosis, clubbing, edema, or obvious deformity. Vascular:  Dorsalis pedis and posterior tibial pulses palpable bilaterally. Skin:  Warm and dry, no erythema, no ulcerations.  Lab Results:  Data Reviewed: I have personally reviewed following labs and imaging studies  CBC: Recent Labs  Lab 06/24/21 2043 06/25/21 0110 06/26/21 0253 06/27/21 0440 06/28/21 0448  WBC 11.5* 9.9 6.7 5.9 4.7  NEUTROABS 9.6*  --   --   --   --   HGB 10.7* 8.0* 9.9* 9.9* 10.2*  HCT 38.6* 29.3* 34.8* 35.8* 36.5*  MCV 71.9* 74.0* 72.2* 72.6* 71.9*  PLT 235 171 205 220 226     Basic Metabolic Panel: Recent Labs  Lab 06/24/21 2043 06/25/21 0110 06/26/21 0253 06/27/21 0440 06/28/21 0448  NA 141 139 137 136 143  K 4.6 4.6 4.5 4.5 4.4  CL 101 98 100 98 105  CO2 34* 32 30 31 33*  GLUCOSE 226* 223* 193* 157* 187*  BUN 30* 31* 22 17 15   CREATININE 1.32* 1.30* 0.95 0.85 0.78  CALCIUM 8.7* 8.4* 8.2* 8.4* 9.2     GFR: Estimated Creatinine Clearance: 128.8 mL/min (by C-G formula based on SCr of 0.78  mg/dL).  Liver Function Tests: Recent Labs  Lab 06/24/21 2043 06/25/21 0110  AST 18 18  ALT 17 17  ALKPHOS 83 82  BILITOT 0.5 0.5  PROT 7.3 7.3  ALBUMIN 2.9* 2.9*      HbA1C: No results for input(s): HGBA1C in the last 72 hours.   CBG: Recent Labs  Lab 06/26/21 2157 06/27/21 0754 06/27/21 1153 06/27/21 1623 06/27/21 2058  GLUCAP 255* 112* 161* 153* 198*     Anemia Panel: No results for input(s): VITAMINB12, FOLATE, FERRITIN, TIBC, IRON, RETICCTPCT in the last 72 hours.   Recent Results (from the past 240 hour(s))  Resp Panel by RT-PCR (Flu A&B, Covid) Nasopharyngeal Swab     Status: None   Collection Time: 06/24/21  8:45 PM   Specimen: Nasopharyngeal Swab; Nasopharyngeal(NP) swabs in vial transport medium  Result Value Ref Range Status   SARS Coronavirus 2 by RT PCR NEGATIVE NEGATIVE Final    Comment: (NOTE) SARS-CoV-2 target nucleic acids are NOT DETECTED.  The SARS-CoV-2 RNA is generally detectable in upper respiratory specimens during the acute phase of infection. The lowest concentration of SARS-CoV-2 viral copies this assay can detect is 138 copies/mL. A negative result does not preclude SARS-Cov-2 infection and should not be used as the sole basis for treatment or other patient management decisions. A negative result may occur with  improper specimen collection/handling, submission of specimen other than nasopharyngeal swab, presence of viral mutation(s) within the areas targeted by this assay, and inadequate number of viral copies(<138 copies/mL). A negative result must be combined with clinical observations, patient history, and epidemiological information. The expected result is Negative.  Fact Sheet for Patients:  EntrepreneurPulse.com.au  Fact Sheet for Healthcare Providers:  IncredibleEmployment.be  This test is no t yet approved or cleared by the [place] FDA and  has been authorized for detection  and/or diagnosis of SARS-CoV-2 by FDA under an Emergency Use Authorization (EUA). This EUA will remain  in effect (meaning this test can be used) for the duration of the COVID-19 declaration under Section 564(b)(1) of the Act, 21 U.S.C.section 360bbb-3(b)(1), unless the authorization  is terminated  or revoked sooner.       Influenza A by PCR NEGATIVE NEGATIVE Final   Influenza B by PCR NEGATIVE NEGATIVE Final    Comment: (NOTE) The Xpert Xpress SARS-CoV-2/FLU/RSV plus assay is intended as an aid in the diagnosis of influenza from Nasopharyngeal swab specimens and should not be used as a sole basis for treatment. Nasal washings and aspirates are unacceptable for Xpert Xpress SARS-CoV-2/FLU/RSV testing.  Fact Sheet for Patients: EntrepreneurPulse.com.au  Fact Sheet for Healthcare Providers: IncredibleEmployment.be  This test is not yet approved or cleared by the [place] FDA and has been authorized for detection and/or diagnosis of SARS-CoV-2 by FDA under an Emergency Use Authorization (EUA). This EUA will remain in effect (meaning this test can be used) for the duration of the COVID-19 declaration under Section 564(b)(1) of the Act, 21 U.S.C. section 360bbb-3(b)(1), unless the authorization is terminated or revoked.  Performed at [facility], [place] [address] [place], [place] [address]   MRSA Next Gen by PCR, Nasal     Status: Abnormal   Collection Time: 06/25/21 12:49 AM   Specimen: Nasal Mucosa; Nasal Swab  Result Value Ref Range Status   MRSA by PCR Next Gen DETECTED (A) NOT DETECTED Final    Comment: CRITICAL RESULT CALLED TO, READ BACK BY AND VERIFIED WITH: HARRIS, K. RN ON 06/25/2021 @ 0640 BY MECIAL J. (NOTE) The GeneXpert MRSA Assay (FDA approved for NASAL specimens only), is one component of a comprehensive MRSA colonization surveillance program. It is not intended to diagnose MRSA infection nor to  guide or monitor treatment for MRSA infections. Test performance is not FDA approved in patients less than 74 years old. Performed at [facility], [place] [address] [place], [place] [address]   Respiratory (~20 pathogens) panel by PCR     Status: None   Collection Time: 06/25/21 12:49 AM   Specimen: Nasal Mucosa; Respiratory  Result Value Ref Range Status   Adenovirus NOT DETECTED NOT DETECTED Final   Coronavirus 229E NOT DETECTED NOT DETECTED Final    Comment: (NOTE) The Coronavirus on the Respiratory Panel, DOES NOT test for the novel  Coronavirus (2019 nCoV)    Coronavirus HKU1 NOT DETECTED NOT DETECTED Final   Coronavirus NL63 NOT DETECTED NOT DETECTED Final   Coronavirus OC43 NOT DETECTED NOT DETECTED Final   Metapneumovirus NOT DETECTED NOT DETECTED Final   Rhinovirus / Enterovirus NOT DETECTED NOT DETECTED Final   Influenza A NOT DETECTED NOT DETECTED Final   Influenza B NOT DETECTED NOT DETECTED Final   Parainfluenza Virus 1 NOT DETECTED NOT DETECTED Final   Parainfluenza Virus 2 NOT DETECTED NOT DETECTED Final   Parainfluenza Virus 3 NOT DETECTED NOT DETECTED Final   Parainfluenza Virus 4 NOT DETECTED NOT DETECTED Final   Respiratory Syncytial Virus NOT DETECTED NOT DETECTED Final   Bordetella pertussis NOT DETECTED NOT DETECTED Final   Bordetella Parapertussis NOT DETECTED NOT DETECTED Final   Chlamydophila pneumoniae NOT DETECTED NOT DETECTED Final   Mycoplasma pneumoniae NOT DETECTED NOT DETECTED Final    Comment: Performed at [facility], [place]. [address], [place], [place] [address]       Radiology Studies: No results found.     LOS: 3 days   [place] Pager: Secure chat  [facility] 7p-7a Pager on [email or web address]  06/28/2021, 7:51 AM

## 2021-06-29 LAB — CBC
HCT: 37.4 % — ABNORMAL LOW (ref 39.0–52.0)
Hemoglobin: 10.4 g/dL — ABNORMAL LOW (ref 13.0–17.0)
MCH: 20 pg — ABNORMAL LOW (ref 26.0–34.0)
MCHC: 27.8 g/dL — ABNORMAL LOW (ref 30.0–36.0)
MCV: 72.1 fL — ABNORMAL LOW (ref 80.0–100.0)
Platelets: 221 10*3/uL (ref 150–400)
RBC: 5.19 MIL/uL (ref 4.22–5.81)
RDW: 21.3 % — ABNORMAL HIGH (ref 11.5–15.5)
WBC: 4.9 10*3/uL (ref 4.0–10.5)
nRBC: 0 % (ref 0.0–0.2)

## 2021-06-29 LAB — BASIC METABOLIC PANEL
Anion gap: 4 — ABNORMAL LOW (ref 5–15)
BUN: 16 mg/dL (ref 8–23)
CO2: 31 mmol/L (ref 22–32)
Calcium: 9 mg/dL (ref 8.9–10.3)
Chloride: 101 mmol/L (ref 98–111)
Creatinine, Ser: 0.92 mg/dL (ref 0.61–1.24)
GFR, Estimated: >60 mL/min (ref >60)
Glucose, Bld: 253 mg/dL — ABNORMAL HIGH (ref 70–99)
Potassium: 4.7 mmol/L (ref 3.5–5.1)
Sodium: 136 mmol/L (ref 135–145)

## 2021-06-29 LAB — GLUCOSE, CAPILLARY
Glucose-Capillary: 200 mg/dL — ABNORMAL HIGH (ref 70–99)
Glucose-Capillary: 196 mg/dL — ABNORMAL HIGH (ref 70–99)
Glucose-Capillary: 249 mg/dL — ABNORMAL HIGH (ref 70–99)
Glucose-Capillary: 236 mg/dL — ABNORMAL HIGH (ref 70–99)

## 2021-06-29 LAB — SARS CORONAVIRUS 2 (TAT 6-24 HRS): SARS Coronavirus 2: NEGATIVE

## 2021-06-29 MED ORDER — DIPHENHYDRAMINE HCL 25 MG PO CAPS
25.0000 mg | ORAL_CAPSULE | Freq: Three times a day (TID) | ORAL | Status: DC | PRN
  Administered 2021-06-29: 25 mg via ORAL
  Filled 2021-06-29: qty 1

## 2021-06-29 NOTE — TOC Transition Note (Addendum)
Transition of Care [facility]) - CM/SW Discharge Note   Patient Details  Name: [name] MRN: [id number] Date of Birth: 07-31-1956  [facility] of [facility] CM/SW Contact:  Dessa Phi, RN Phone Number: 06/29/2021, 1:36 PM   Clinical Narrative: Returning back to [facility] to [phone],nsg call report tel#[phone]. PTAR called for 3p pick up.If nsg needs additional time please call to cancel PTAR. No futher CM needs.    Final next level of care: Long Term Nursing Home Barriers to Discharge: No Barriers Identified   Patient Goals and CMS Choice Patient states their goals for this hospitalization and ongoing recovery are:: return back to [place] Medicare.gov Compare Post Acute Care list provided to:: Patient Choice offered to / list presented to : Patient  Discharge Placement                       Discharge Plan and Services   Discharge Planning Services: CM Consult                                 Social Determinants of Health (SDOH) Interventions     Readmission Risk Interventions No flowsheet data found.

## 2021-06-29 NOTE — Plan of Care (Signed)
  Problem: Clinical Measurements: Goal: Will remain free from infection Outcome: Progressing Goal: Diagnostic test results will improve Outcome: Progressing   Problem: Nutrition: Goal: Adequate nutrition will be maintained Outcome: Progressing   Problem: Elimination: Goal: Will not experience complications related to bowel motility Outcome: Progressing Goal: Will not experience complications related to urinary retention Outcome: Progressing   Problem: Pain Managment: Goal: General experience of comfort will improve Outcome: Progressing

## 2021-06-29 NOTE — Progress Notes (Signed)
2335 PTAR present transfering patient to [name] via stretcher

## 2021-06-29 NOTE — Progress Notes (Signed)
Continues to decline nocturnal BiPAP

## 2021-06-29 NOTE — Progress Notes (Signed)
Called [place] [facility] a total of 5 times, secretary transfers each time to an unanswered line that rings several time until disconnection. Last time called was 20:06 I called and spoke with secretary Levada Dy & explained that at this point I have called 5x & I have to go home but would really like to give report on pt to receiving RN. Tried to leave my phone # for a call back earlier today-secretary stated this was not allowed.

## 2021-06-29 NOTE — Discharge Summary (Signed)
Physician Discharge Summary  [name] LXB:[id number] DOB: 1956/02/19 DOA: 06/24/2021  PCP: Charlsie Merles, MD  Admit date: 06/24/2021 Discharge date: 06/29/2021  Admitted From: SNF Disposition:  SNF  Recommendations for Outpatient Follow-up:  Follow up with PCP in 1-2 weeks Please obtain BMP/CBC in one week  Discharge Condition:Stable  CODE STATUS:Full  Diet recommendation: Low salt low fat DM diet    Brief/Interim Summary: 65 y.o. male with medical history significant of dCHF, HTN, DM, chronic hypoxic and hypercapnic resp failure on 2L O2 at baseline.  Patient reports that he is a former smoker.  Patient living in a skilled nursing facility.  Patient was treated for pneumonia about a month ago.  Complained of shortness of breath.  Was found to have saturations in the 70s on 2 L at the skilled nursing facility.  Patient was brought into the hospital.  He was noted to be hypercapnic.  He was placed on BiPAP.   Consultants: None at this time   Procedures: None   Subjective/Interval History: No acute issues or events overnight, patient continues to refuse bipap "I don't need it"   Assessment/Plan:  Acute on chronic respiratory failure with hypoxia and hypercapnia, resolving -Initially placed on BiPAP, able to wean to nasal cannula at this point  -Continue as needed BiPAP as needed/nightly  -2L [place] oxygen at baseline but patient reports wearing it 'as needed' without any clear delineation of rest/active/sleep usage. -Symptoms continue to improve, appears to be approaching baseline respiratory status   Community-acquired pneumonia, POA, resolved -Likely viral although cannot rule out bacterial pneumonia continue 5-day course of antibiotics completed -COVID reassuringly negative, viral panel also negative -Chest x-ray unremarkable although CT showed questionable bibasilar atelectasis   Acute kidney injury, resolved -Likely poor p.o. intake exacerbated by diuretics -Creatinine  currently within normal limits after completing IV fluids   Chronic diastolic CHF, no acute exacerbation -Monitor closely in the setting of diuretic cessation and IV fluids as above -IV fluids not discontinued, continue to follow clinically, resume diuretics in the next 24 to 48 hours or at discharge   Diabetes mellitus type 2, uncontrolled with hyperglycemia HbA1c 8.4. Continue Lantus, sliding scale insulin, diabetic diet and hypoglycemic protocol   Iron deficiency anemia Drop in hemoglobin is likely dilutional.  Stable for the most part.  No overt bleeding noted.  Continue iron supplements.   History of depression and other unspecified psychiatric illness Noted to be on Abilify as well as the lurasidone.  These have been continued.   Chronic pain Continue Neurontin.   Bilateral lower extremity ulcers Both legs are covered in [employer].  He tells me that this is changed to 3 times a week.  Seen by wound care nurse   Obesity Estimated body mass index is 31.48 kg/m as calculated from the following:   Height as of this encounter: 6\' 5"  (1.956 m).   Weight as of this encounter: 120.4 kg.   Discharge Instructions  Discharge Instructions     Diet - low sodium heart healthy   Complete by: As directed    Discharge wound care:   Complete by: As directed    1. Cleanse left LE wounds with 1/4% Dakin's solution, pat dry 2. Apply Drawtex (lawson # [id number])TX the left posterior calf wounds; use silver hydrofiber [name] [id number])OZ wound edges. Top with ABD pad 3. Apply silver hydrofiber to the right LE wounds, top with ABD pads.  Contact orthopedic tech to wrap legs after dressings applied, will need to assist  ortho tech because dressing will essentially be held in place by the compression wraps.    CHANGE M/W/F      Allergies as of 06/29/2021       Reactions   Other    Pt is a Jehovah Witness. No blood products.   Sulfa Antibiotics    Causes flu-like symptom : sweating, chills,  fever, body aches        Medication List     TAKE these medications    acetaminophen 325 MG tablet Commonly known as: TYLENOL Take 2 tablets (650 mg total) by mouth every 6 (six) hours as needed for mild pain (or Fever >/= 101).   ARIPiprazole 2 MG tablet Commonly known as: ABILIFY Take 2 mg by mouth at bedtime.   aspirin EC 81 MG tablet Take 81 mg by mouth every morning.   Basaglar KwikPen 100 UNIT/ML Inject 30 Units into the skin 2 (two) times daily.   benzocaine 10 % mucosal gel Commonly known as: ORAJEL Use as directed 1 application in the mouth or throat every 2 (two) hours as needed for mouth pain (soreness).   betamethasone dipropionate 0.05 % lotion Apply 1 application topically every 12 (twelve) hours as needed (itching).   chlorhexidine 0.12 % solution Commonly known as: PERIDEX Use as directed 5 mLs in the mouth or throat 2 (two) times daily.   ferrous sulfate 325 (65 FE) MG tablet Take 325 mg by mouth daily.   furosemide 20 MG tablet Commonly known as: LASIX Take 60 mg by mouth 2 (two) times daily.   gabapentin 300 MG capsule Commonly known as: NEURONTIN Take 300 mg by mouth 3 (three) times daily.   Glucagon Emergency 1 MG/ML Solr Inject 1 mg as directed every 6 (six) hours as needed (low CBG).   hydrocortisone 2.5 % rectal cream Commonly known as: ANUSOL-HC Place 1 application rectally daily as needed for hemorrhoids or anal itching.   hydrocortisone 2.5 % rectal cream Commonly known as: ANUSOL-HC Apply 1 application topically every 12 (twelve) hours as needed (rash).   insulin aspart 100 UNIT/ML injection Commonly known as: NovoLOG Before each meal 3 times a day, 140-199 - 2 units, 200-250 - 4 units, 251-299 - 6 units,  300-349 - 8 units,  350 or above 10 units. Dispense syringes and needles as needed, Ok to switch to PEN if approved. Substitute to any brand approved. DX DM2, Code E11.65 What changed:  how much to take how to take this when  to take this additional instructions   lisinopril 2.5 MG tablet Commonly known as: ZESTRIL Take 1 tablet (2.5 mg total) by mouth daily.   melatonin 3 MG Tabs tablet Take 3 mg by mouth at bedtime.   METAMUCIL FIBER PO Take 2 Wafers by mouth in the morning and at bedtime.   metFORMIN 500 MG tablet Commonly known as: GLUCOPHAGE Take 500 mg by mouth 2 (two) times daily with a meal.   multivitamin with minerals Tabs tablet Take 1 tablet by mouth daily.   nitroGLYCERIN 0.4 MG SL tablet Commonly known as: NITROSTAT Place 0.4 mg under the tongue every 5 (five) minutes as needed for chest pain.   tamsulosin 0.4 MG Caps capsule Commonly known as: FLOMAX Take 0.8 mg by mouth at bedtime.   tiZANidine 4 MG tablet Commonly known as: ZANAFLEX Take 4 mg by mouth every 8 (eight) hours as needed for muscle spasms.   Tradjenta 5 MG Tabs tablet Generic drug: linagliptin Take 5 mg by mouth  daily.   traZODone 50 MG tablet Commonly known as: DESYREL Take 25 mg by mouth at bedtime as needed.   triamcinolone cream 0.1 % Commonly known as: KENALOG Apply 1 application topically 2 (two) times daily.   Viibryd 40 MG Tabs Generic drug: Vilazodone HCl Take 40 mg by mouth daily.   Vitamin D 50 MCG (2000 UT) tablet Take 2,000 Units by mouth daily.               Discharge Care Instructions  (From admission, onward)           Start     Ordered   06/29/21 0000  Discharge wound care:       Comments: 1. Cleanse left LE wounds with 1/4% Dakin's solution, pat dry 2. Apply Drawtex (lawson # [id number])OA the left posterior calf wounds; use silver hydrofiber [name] [id number])TK wound edges. Top with ABD pad 3. Apply silver hydrofiber to the right LE wounds, top with ABD pads.  Contact orthopedic tech to wrap legs after dressings applied, will need to assist ortho tech because dressing will essentially be held in place by the compression wraps.    CHANGE M/W/F   06/29/21 1157             Allergies  Allergen Reactions   Other     Pt is a Jehovah Witness. No blood products.   Sulfa Antibiotics     Causes flu-like symptom : sweating, chills, fever, body aches    Consultations: None  Procedures/Studies: CT Angio Chest PE W and/or Wo Contrast  Result Date: 06/25/2021 CLINICAL DATA:  History of pneumonia 1 month ago with persistent hypoxia EXAM: CT ANGIOGRAPHY CHEST WITH CONTRAST TECHNIQUE: Multidetector CT imaging of the chest was performed using the standard protocol during bolus administration of intravenous contrast. Multiplanar CT image reconstructions and MIPs were obtained to evaluate the vascular anatomy. CONTRAST:  70mL OMNIPAQUE IOHEXOL 350 MG/ML SOLN COMPARISON:  Chest x-ray from the previous day. FINDINGS: Cardiovascular: Atherosclerotic calcifications of the thoracic aorta are noted without aneurysmal dilatation. No cardiac enlargement is seen. Coronary calcifications are noted. The pulmonary artery shows a normal branching pattern. No discrete filling defect is identified to suggest pulmonary embolism. Lower lobe branches are somewhat limited due to underlying atelectatic changes as well as patient motion artifact. Mediastinum/Nodes: Thoracic inlet is within normal limits. No sizable hilar or mediastinal adenopathy is noted. The esophagus as visualized is within normal limits. Lungs/Pleura: Lungs are well aerated bilaterally with the exception of mild bibasilar atelectatic changes. This is slightly greater on the right than the left. No sizable effusion is seen. No sizable parenchymal nodule is noted. Upper Abdomen: Visualized upper abdomen is within normal limits. Musculoskeletal: Degenerative changes of the thoracic spine are noted. No acute bony abnormality is seen. Review of the MIP images confirms the above findings. IMPRESSION: No definitive pulmonary embolism is noted. Lower lobe branches particularly on the right are somewhat limited due to underlying atelectasis  and patient motion artifact. Bibasilar atelectatic changes right greater than left. No associated effusion is seen Aortic Atherosclerosis (ICD10-I70.0). Electronically Signed   By: Inez Catalina M.D.   On: 06/25/2021 01:35   DG Chest Port 1 View  Result Date: 06/24/2021 CLINICAL DATA:  History of pneumonia, tobacco use EXAM: PORTABLE CHEST 1 VIEW COMPARISON:  04/03/2021 FINDINGS: Cardiac shadow is stable. The lungs are well aerated bilaterally. Right basilar atelectatic changes are seen. No focal confluent infiltrate is noted. Mild persistent vascular congestion remains. No bony abnormality is seen.  IMPRESSION: Mild vascular congestion and right basilar atelectasis. No other focal abnormality is noted. Electronically Signed   By: Inez Catalina M.D.   On: 06/24/2021 21:11     Subjective: No acute issues or events overnight denies nausea vomiting diarrhea constipation headache fevers chills or chest pain   Discharge Exam: Vitals:   06/28/21 2055 06/29/21 0442  BP: (!) 147/68 (!) 147/73  Pulse: 62 (!) 49  Resp: 18 20  Temp: 98.5 F (36.9 C) 98 F (36.7 C)  SpO2: 95% 96%   Vitals:   06/28/21 0529 06/28/21 1142 06/28/21 2055 06/29/21 0442  BP: 139/68 140/64 (!) 147/68 (!) 147/73  Pulse: (!) 54 66 62 (!) 49  Resp: 18 18 18 20   Temp: 98.2 F (36.8 C) 97.7 F (36.5 C) 98.5 F (36.9 C) 98 F (36.7 C)  TempSrc: Oral Oral Oral   SpO2: 93% 96% 95% 96%  Weight:      Height:        General:  Pleasantly resting in bed, No acute distress. HEENT:  Normocephalic atraumatic.  Sclerae nonicteric, noninjected.  Extraocular movements intact bilaterally. Neck:  Without mass or deformity.  Trachea is midline. Lungs: Diminished bilaterally without overt wheezes rhonchi or rales - good inspiratory effort Heart:  Regular rate and rhythm.  Without murmurs, rubs, or gallops. Abdomen:  Soft, nontender, nondistended.  Without guarding or rebound. Extremities: Without cyanosis, clubbing, edema, or obvious  deformity.  Lower extremity wraps/bandage clean dry intact Vascular:  Dorsalis pedis and posterior tibial pulses palpable bilaterally. Skin:  Warm and dry, no erythema, no ulcerations.   The results of significant diagnostics from this hospitalization (including imaging, microbiology, ancillary and laboratory) are listed below for reference.     Microbiology: Recent Results (from the past 240 hour(s))  Resp Panel by RT-PCR (Flu A&B, Covid) Nasopharyngeal Swab     Status: None   Collection Time: 06/24/21  8:45 PM   Specimen: Nasopharyngeal Swab; Nasopharyngeal(NP) swabs in vial transport medium  Result Value Ref Range Status   SARS Coronavirus 2 by RT PCR NEGATIVE NEGATIVE Final    Comment: (NOTE) SARS-CoV-2 target nucleic acids are NOT DETECTED.  The SARS-CoV-2 RNA is generally detectable in upper respiratory specimens during the acute phase of infection. The lowest concentration of SARS-CoV-2 viral copies this assay can detect is 138 copies/mL. A negative result does not preclude SARS-Cov-2 infection and should not be used as the sole basis for treatment or other patient management decisions. A negative result may occur with  improper specimen collection/handling, submission of specimen other than nasopharyngeal swab, presence of viral mutation(s) within the areas targeted by this assay, and inadequate number of viral copies(<138 copies/mL). A negative result must be combined with clinical observations, patient history, and epidemiological information. The expected result is Negative.  Fact Sheet for Patients:  EntrepreneurPulse.com.au  Fact Sheet for Healthcare Providers:  IncredibleEmployment.be  This test is no t yet approved or cleared by the [place] FDA and  has been authorized for detection and/or diagnosis of SARS-CoV-2 by FDA under an Emergency Use Authorization (EUA). This EUA will remain  in effect (meaning this test can be  used) for the duration of the COVID-19 declaration under Section 564(b)(1) of the Act, 21 U.S.C.section 360bbb-3(b)(1), unless the authorization is terminated  or revoked sooner.       Influenza A by PCR NEGATIVE NEGATIVE Final   Influenza B by PCR NEGATIVE NEGATIVE Final    Comment: (NOTE) The Xpert Xpress SARS-CoV-2/FLU/RSV plus assay is  intended as an aid in the diagnosis of influenza from Nasopharyngeal swab specimens and should not be used as a sole basis for treatment. Nasal washings and aspirates are unacceptable for Xpert Xpress SARS-CoV-2/FLU/RSV testing.  Fact Sheet for Patients: EntrepreneurPulse.com.au  Fact Sheet for Healthcare Providers: IncredibleEmployment.be  This test is not yet approved or cleared by the [place] FDA and has been authorized for detection and/or diagnosis of SARS-CoV-2 by FDA under an Emergency Use Authorization (EUA). This EUA will remain in effect (meaning this test can be used) for the duration of the COVID-19 declaration under Section 564(b)(1) of the Act, 21 U.S.C. section 360bbb-3(b)(1), unless the authorization is terminated or revoked.  Performed at [facility], [place] [address], [place], [place] [address]   MRSA Next Gen by PCR, Nasal     Status: Abnormal   Collection Time: 06/25/21 12:49 AM   Specimen: Nasal Mucosa; Nasal Swab  Result Value Ref Range Status   MRSA by PCR Next Gen DETECTED (A) NOT DETECTED Final    Comment: CRITICAL RESULT CALLED TO, READ BACK BY AND VERIFIED WITH: HARRIS, K. RN ON 06/25/2021 @ 0640 BY MECIAL J. (NOTE) The GeneXpert MRSA Assay (FDA approved for NASAL specimens only), is one component of a comprehensive MRSA colonization surveillance program. It is not intended to diagnose MRSA infection nor to guide or monitor treatment for MRSA infections. Test performance is not FDA approved in patients less than 78 years old. Performed at [place] [facility], [place] [address], [place], [place] [address]   Respiratory (~20 pathogens) panel by PCR     Status: None   Collection Time: 06/25/21 12:49 AM   Specimen: Nasal Mucosa; Respiratory  Result Value Ref Range Status   Adenovirus NOT DETECTED NOT DETECTED Final   Coronavirus 229E NOT DETECTED NOT DETECTED Final    Comment: (NOTE) The Coronavirus on the Respiratory Panel, DOES NOT test for the novel  Coronavirus (2019 nCoV)    Coronavirus HKU1 NOT DETECTED NOT DETECTED Final   Coronavirus NL63 NOT DETECTED NOT DETECTED Final   Coronavirus OC43 NOT DETECTED NOT DETECTED Final   Metapneumovirus NOT DETECTED NOT DETECTED Final   Rhinovirus / Enterovirus NOT DETECTED NOT DETECTED Final   Influenza A NOT DETECTED NOT DETECTED Final   Influenza B NOT DETECTED NOT DETECTED Final   Parainfluenza Virus 1 NOT DETECTED NOT DETECTED Final   Parainfluenza Virus 2 NOT DETECTED NOT DETECTED Final   Parainfluenza Virus 3 NOT DETECTED NOT DETECTED Final   Parainfluenza Virus 4 NOT DETECTED NOT DETECTED Final   Respiratory Syncytial Virus NOT DETECTED NOT DETECTED Final   Bordetella pertussis NOT DETECTED NOT DETECTED Final   Bordetella Parapertussis NOT DETECTED NOT DETECTED Final   Chlamydophila pneumoniae NOT DETECTED NOT DETECTED Final   Mycoplasma pneumoniae NOT DETECTED NOT DETECTED Final    Comment: Performed at [facility], [place]. [address], [place], [place] [address]  SARS CORONAVIRUS 2 (TAT 6-24 HRS) Nasopharyngeal Nasopharyngeal Swab     Status: None   Collection Time: 06/28/21  4:17 PM   Specimen: Nasopharyngeal Swab  Result Value Ref Range Status   SARS Coronavirus 2 NEGATIVE NEGATIVE Final    Comment: (NOTE) SARS-CoV-2 target nucleic acids are NOT DETECTED.  The SARS-CoV-2 RNA is generally detectable in upper and lower respiratory specimens during the acute phase of infection. Negative results do not preclude SARS-CoV-2 infection, do not rule out co-infections  with other pathogens, and should not be used as the  sole basis for treatment or other patient management decisions. Negative results must be combined with clinical observations, patient history, and epidemiological information. The expected result is Negative.  Fact Sheet for Patients: SugarRoll.be  Fact Sheet for Healthcare Providers: https://www.woods-mathews.com/  This test is not yet approved or cleared by the [place] FDA and  has been authorized for detection and/or diagnosis of SARS-CoV-2 by FDA under an Emergency Use Authorization (EUA). This EUA will remain  in effect (meaning this test can be used) for the duration of the COVID-19 declaration under Se ction 564(b)(1) of the Act, 21 U.S.C. section 360bbb-3(b)(1), unless the authorization is terminated or revoked sooner.  Performed at [facility], [place] [address], [place], [place] [address]      Labs: BNP (last 3 results) Recent Labs    06/24/21 2043  BNP 00.9   Basic Metabolic Panel: Recent Labs  Lab 06/25/21 0110 06/26/21 0253 06/27/21 0440 06/28/21 0448 06/29/21 0441  NA 139 137 136 143 136  K 4.6 4.5 4.5 4.4 4.7  CL 98 100 98 105 101  CO2 32 30 31 33* 31  GLUCOSE 223* 193* 157* 187* 253*  BUN 31* 22 17 15 16   CREATININE 1.30* 0.95 0.85 0.78 0.92  CALCIUM 8.4* 8.2* 8.4* 9.2 9.0   Liver Function Tests: Recent Labs  Lab 06/24/21 2043 06/25/21 0110  AST 18 18  ALT 17 17  ALKPHOS 83 82  BILITOT 0.5 0.5  PROT 7.3 7.3  ALBUMIN 2.9* 2.9*   No results for input(s): LIPASE, AMYLASE in the last 168 hours. No results for input(s): AMMONIA in the last 168 hours. CBC: Recent Labs  Lab 06/24/21 2043 06/25/21 0110 06/26/21 0253 06/27/21 0440 06/28/21 0448 06/29/21 0441  WBC 11.5* 9.9 6.7 5.9 4.7 4.9  NEUTROABS 9.6*  --   --   --   --   --   HGB 10.7* 8.0* 9.9* 9.9* 10.2* 10.4*  HCT 38.6* 29.3* 34.8* 35.8* 36.5* 37.4*  MCV 71.9* 74.0* 72.2* 72.6*  71.9* 72.1*  PLT 235 171 205 220 226 221   Cardiac Enzymes: No results for input(s): CKTOTAL, CKMB, CKMBINDEX, TROPONINI in the last 168 hours. BNP: Invalid input(s): POCBNP CBG: Recent Labs  Lab 06/28/21 1135 06/28/21 1651 06/28/21 2048 06/29/21 0728 06/29/21 1138  GLUCAP 161* 171* 270* 200* 196*   D-Dimer No results for input(s): DDIMER in the last 72 hours. Hgb A1c No results for input(s): HGBA1C in the last 72 hours. Lipid Profile No results for input(s): CHOL, HDL, LDLCALC, TRIG, CHOLHDL, LDLDIRECT in the last 72 hours. Thyroid function studies No results for input(s): TSH, T4TOTAL, T3FREE, THYROIDAB in the last 72 hours.  Invalid input(s): FREET3 Anemia work up No results for input(s): VITAMINB12, FOLATE, FERRITIN, TIBC, IRON, RETICCTPCT in the last 72 hours. Urinalysis    Component Value Date/Time   COLORURINE YELLOW 11/02/2015 2258   APPEARANCEUR CLEAR 11/02/2015 2258   LABSPEC 1.021 11/02/2015 2258   PHURINE 5.0 11/02/2015 2258   GLUCOSEU NEGATIVE 11/02/2015 2258   HGBUR NEGATIVE 11/02/2015 2258   BILIRUBINUR NEGATIVE 11/02/2015 2258   KETONESUR NEGATIVE 11/02/2015 2258   PROTEINUR NEGATIVE 11/02/2015 2258   UROBILINOGEN 0.2 03/26/2015 0144   NITRITE NEGATIVE 11/02/2015 2258   LEUKOCYTESUR SMALL (A) 11/02/2015 2258   Sepsis Labs Invalid input(s): PROCALCITONIN,  WBC,  LACTICIDVEN Microbiology Recent Results (from the past 240 hour(s))  Resp Panel by RT-PCR (Flu A&B, Covid) Nasopharyngeal Swab     Status: None   Collection Time: 06/24/21  8:45 PM   Specimen: Nasopharyngeal Swab; Nasopharyngeal(NP) swabs in vial transport medium  Result Value Ref Range Status   SARS Coronavirus 2 by RT PCR NEGATIVE NEGATIVE Final    Comment: (NOTE) SARS-CoV-2 target nucleic acids are NOT DETECTED.  The SARS-CoV-2 RNA is generally detectable in upper respiratory specimens during the acute phase of infection. The lowest concentration of SARS-CoV-2 viral copies this  assay can detect is 138 copies/mL. A negative result does not preclude SARS-Cov-2 infection and should not be used as the sole basis for treatment or other patient management decisions. A negative result may occur with  improper specimen collection/handling, submission of specimen other than nasopharyngeal swab, presence of viral mutation(s) within the areas targeted by this assay, and inadequate number of viral copies(<138 copies/mL). A negative result must be combined with clinical observations, patient history, and epidemiological information. The expected result is Negative.  Fact Sheet for Patients:  EntrepreneurPulse.com.au  Fact Sheet for Healthcare Providers:  IncredibleEmployment.be  This test is no t yet approved or cleared by the [place] FDA and  has been authorized for detection and/or diagnosis of SARS-CoV-2 by FDA under an Emergency Use Authorization (EUA). This EUA will remain  in effect (meaning this test can be used) for the duration of the COVID-19 declaration under Section 564(b)(1) of the Act, 21 U.S.C.section 360bbb-3(b)(1), unless the authorization is terminated  or revoked sooner.       Influenza A by PCR NEGATIVE NEGATIVE Final   Influenza B by PCR NEGATIVE NEGATIVE Final    Comment: (NOTE) The Xpert Xpress SARS-CoV-2/FLU/RSV plus assay is intended as an aid in the diagnosis of influenza from Nasopharyngeal swab specimens and should not be used as a sole basis for treatment. Nasal washings and aspirates are unacceptable for Xpert Xpress SARS-CoV-2/FLU/RSV testing.  Fact Sheet for Patients: EntrepreneurPulse.com.au  Fact Sheet for Healthcare Providers: IncredibleEmployment.be  This test is not yet approved or cleared by the [place] FDA and has been authorized for detection and/or diagnosis of SARS-CoV-2 by FDA under an Emergency Use Authorization (EUA). This EUA will  remain in effect (meaning this test can be used) for the duration of the COVID-19 declaration under Section 564(b)(1) of the Act, 21 U.S.C. section 360bbb-3(b)(1), unless the authorization is terminated or revoked.  Performed at [facility], [place] [address] [place], [place] [address]   MRSA Next Gen by PCR, Nasal     Status: Abnormal   Collection Time: 06/25/21 12:49 AM   Specimen: Nasal Mucosa; Nasal Swab  Result Value Ref Range Status   MRSA by PCR Next Gen DETECTED (A) NOT DETECTED Final    Comment: CRITICAL RESULT CALLED TO, READ BACK BY AND VERIFIED WITH: HARRIS, K. RN ON 06/25/2021 @ 0640 BY MECIAL J. (NOTE) The GeneXpert MRSA Assay (FDA approved for NASAL specimens only), is one component of a comprehensive MRSA colonization surveillance program. It is not intended to diagnose MRSA infection nor to guide or monitor treatment for MRSA infections. Test performance is not FDA approved in patients less than 8 years old. Performed at [facility], [place] [address] [place], [place] [address]   Respiratory (~20 pathogens) panel by PCR     Status: None   Collection Time: 06/25/21 12:49 AM   Specimen: Nasal Mucosa; Respiratory  Result Value Ref Range Status   Adenovirus NOT DETECTED NOT DETECTED Final   Coronavirus 229E NOT DETECTED NOT DETECTED Final    Comment: (NOTE) The Coronavirus on the Respiratory Panel, DOES  NOT test for the novel  Coronavirus (2019 nCoV)    Coronavirus HKU1 NOT DETECTED NOT DETECTED Final   Coronavirus NL63 NOT DETECTED NOT DETECTED Final   Coronavirus OC43 NOT DETECTED NOT DETECTED Final   Metapneumovirus NOT DETECTED NOT DETECTED Final   Rhinovirus / Enterovirus NOT DETECTED NOT DETECTED Final   Influenza A NOT DETECTED NOT DETECTED Final   Influenza B NOT DETECTED NOT DETECTED Final   Parainfluenza Virus 1 NOT DETECTED NOT DETECTED Final   Parainfluenza Virus 2 NOT DETECTED NOT DETECTED Final   Parainfluenza  Virus 3 NOT DETECTED NOT DETECTED Final   Parainfluenza Virus 4 NOT DETECTED NOT DETECTED Final   Respiratory Syncytial Virus NOT DETECTED NOT DETECTED Final   Bordetella pertussis NOT DETECTED NOT DETECTED Final   Bordetella Parapertussis NOT DETECTED NOT DETECTED Final   Chlamydophila pneumoniae NOT DETECTED NOT DETECTED Final   Mycoplasma pneumoniae NOT DETECTED NOT DETECTED Final    Comment: Performed at [facility], [place] [address] [place], [place] [address]  SARS CORONAVIRUS 2 (TAT 6-24 HRS) Nasopharyngeal Nasopharyngeal Swab     Status: None   Collection Time: 06/28/21  4:17 PM   Specimen: Nasopharyngeal Swab  Result Value Ref Range Status   SARS Coronavirus 2 NEGATIVE NEGATIVE Final    Comment: (NOTE) SARS-CoV-2 target nucleic acids are NOT DETECTED.  The SARS-CoV-2 RNA is generally detectable in upper and lower respiratory specimens during the acute phase of infection. Negative results do not preclude SARS-CoV-2 infection, do not rule out co-infections with other pathogens, and should not be used as the sole basis for treatment or other patient management decisions. Negative results must be combined with clinical observations, patient history, and epidemiological information. The expected result is Negative.  Fact Sheet for Patients: SugarRoll.be  Fact Sheet for Healthcare Providers: https://www.woods-mathews.com/  This test is not yet approved or cleared by the [place] FDA and  has been authorized for detection and/or diagnosis of SARS-CoV-2 by FDA under an Emergency Use Authorization (EUA). This EUA will remain  in effect (meaning this test can be used) for the duration of the COVID-19 declaration under Se ction 564(b)(1) of the Act, 21 U.S.C. section 360bbb-3(b)(1), unless the authorization is terminated or revoked sooner.  Performed at [facility], [place] [address] [place], [place] [address]    Time  coordinating discharge: Over 30 minutes  SIGNED:  Little Ishikawa, DO [facility] 06/29/2021, 11:57 AM Pager   If 7PM-7AM, please contact night-coverage [email or web address]

## 2021-06-29 NOTE — Progress Notes (Signed)
Orthopedic Tech Progress Note Patient Details:  [name] August 10, 1956 [id number] [name] were applied with the assistance of patient's nurse.  Ortho Devices Type of Ortho Device: Haematologist Ortho Device/Splint Location: Bi LE Ortho Device/Splint Interventions: Application   Post Interventions Patient Tolerated: Well Instructions Provided: Adjustment of device, Care of device  [name] E [name] 06/29/2021, 3:07 PM

## 2021-06-29 NOTE — Progress Notes (Addendum)
Inpatient Diabetes Program Recommendations  AACE/ADA: New Consensus Statement on Inpatient Glycemic Control (2015)  Target Ranges:  Prepandial:   less than 140 mg/dL      Peak postprandial:   less than 180 mg/dL (1-2 hours)      Critically ill patients:  140 - 180 mg/dL   Lab Results  Component Value Date   GLUCAP 200 (H) 06/29/2021   HGBA1C 8.4 (H) 06/25/2021    Review of Glycemic Control Results for [name], [name] (MRN [id number]) as of 06/29/2021 09:17  Ref. Range 06/28/2021 07:52 06/28/2021 11:35 06/28/2021 16:51 06/28/2021 20:48 06/29/2021 07:28  Glucose-Capillary Latest Ref Range: 70 - 99 mg/dL 153 (H) 161 (H) 171 (H) 270 (H) 200 (H)   Diabetes history: DM 2 Outpatient Diabetes medications: Basaglar 30 units BID, Novolog 14 units TID, Metformin 500 BID Current orders for Inpatient glycemic control:  Semglee 25 units bid Novolog 0-15 units tid  Inpatient Diabetes Program Recommendations:    - May consider Novolog 2 units tid meal coverage if eating >50% of meals  Thanks, Tama Headings RN, MSN, BC-ADM Inpatient Diabetes Coordinator Team Pager [phone] (8a-5p)

## 2021-09-03 ENCOUNTER — Inpatient Hospital Stay (HOSPITAL_COMMUNITY): Payer: No Typology Code available for payment source

## 2021-09-03 ENCOUNTER — Emergency Department (HOSPITAL_COMMUNITY): Payer: No Typology Code available for payment source

## 2021-09-03 ENCOUNTER — Inpatient Hospital Stay (HOSPITAL_COMMUNITY)
Admission: EM | Admit: 2021-09-03 | Discharge: 2021-09-11 | DRG: 871 | Disposition: A | Discharge status: Skilled Nursing Facility | Payer: No Typology Code available for payment source | Source: Skilled Nursing Facility | Attending: Internal Medicine | Admitting: Internal Medicine

## 2021-09-03 ENCOUNTER — Encounter (HOSPITAL_COMMUNITY): Payer: Self-pay | Admitting: Emergency Medicine

## 2021-09-03 ENCOUNTER — Other Ambulatory Visit: Payer: Self-pay

## 2021-09-03 DIAGNOSIS — Z7982 Long term (current) use of aspirin: Secondary | ICD-10-CM

## 2021-09-03 DIAGNOSIS — R7989 Other specified abnormal findings of blood chemistry: Secondary | ICD-10-CM | POA: Diagnosis present

## 2021-09-03 DIAGNOSIS — L03115 Cellulitis of right lower limb: Secondary | ICD-10-CM | POA: Diagnosis present

## 2021-09-03 DIAGNOSIS — J9621 Acute and chronic respiratory failure with hypoxia: Secondary | ICD-10-CM | POA: Diagnosis present

## 2021-09-03 DIAGNOSIS — J9622 Acute and chronic respiratory failure with hypercapnia: Secondary | ICD-10-CM | POA: Diagnosis present

## 2021-09-03 DIAGNOSIS — A401 Sepsis due to streptococcus, group B: Principal | ICD-10-CM | POA: Diagnosis present

## 2021-09-03 DIAGNOSIS — R197 Diarrhea, unspecified: Secondary | ICD-10-CM | POA: Diagnosis present

## 2021-09-03 DIAGNOSIS — I5032 Chronic diastolic (congestive) heart failure: Secondary | ICD-10-CM | POA: Diagnosis present

## 2021-09-03 DIAGNOSIS — A419 Sepsis, unspecified organism: Secondary | ICD-10-CM | POA: Diagnosis present

## 2021-09-03 DIAGNOSIS — E119 Type 2 diabetes mellitus without complications: Secondary | ICD-10-CM | POA: Diagnosis not present

## 2021-09-03 DIAGNOSIS — Z6831 Body mass index (BMI) 31.0-31.9, adult: Secondary | ICD-10-CM

## 2021-09-03 DIAGNOSIS — F32A Depression, unspecified: Secondary | ICD-10-CM | POA: Diagnosis present

## 2021-09-03 DIAGNOSIS — Z882 Allergy status to sulfonamides status: Secondary | ICD-10-CM | POA: Diagnosis not present

## 2021-09-03 DIAGNOSIS — Z79899 Other long term (current) drug therapy: Secondary | ICD-10-CM

## 2021-09-03 DIAGNOSIS — L03116 Cellulitis of left lower limb: Secondary | ICD-10-CM | POA: Diagnosis present

## 2021-09-03 DIAGNOSIS — Z20822 Contact with and (suspected) exposure to covid-19: Secondary | ICD-10-CM | POA: Diagnosis present

## 2021-09-03 DIAGNOSIS — G9341 Metabolic encephalopathy: Secondary | ICD-10-CM | POA: Diagnosis present

## 2021-09-03 DIAGNOSIS — E8729 Other acidosis: Secondary | ICD-10-CM | POA: Diagnosis present

## 2021-09-03 DIAGNOSIS — J9692 Respiratory failure, unspecified with hypercapnia: Secondary | ICD-10-CM

## 2021-09-03 DIAGNOSIS — Z794 Long term (current) use of insulin: Secondary | ICD-10-CM

## 2021-09-03 DIAGNOSIS — K219 Gastro-esophageal reflux disease without esophagitis: Secondary | ICD-10-CM | POA: Diagnosis present

## 2021-09-03 DIAGNOSIS — Y95 Nosocomial condition: Secondary | ICD-10-CM | POA: Diagnosis present

## 2021-09-03 DIAGNOSIS — J9611 Chronic respiratory failure with hypoxia: Secondary | ICD-10-CM | POA: Diagnosis present

## 2021-09-03 DIAGNOSIS — J9602 Acute respiratory failure with hypercapnia: Secondary | ICD-10-CM | POA: Diagnosis not present

## 2021-09-03 DIAGNOSIS — Z0189 Encounter for other specified special examinations: Secondary | ICD-10-CM

## 2021-09-03 DIAGNOSIS — L97929 Non-pressure chronic ulcer of unspecified part of left lower leg with unspecified severity: Secondary | ICD-10-CM | POA: Diagnosis present

## 2021-09-03 DIAGNOSIS — J189 Pneumonia, unspecified organism: Secondary | ICD-10-CM | POA: Diagnosis present

## 2021-09-03 DIAGNOSIS — R1310 Dysphagia, unspecified: Secondary | ICD-10-CM | POA: Diagnosis present

## 2021-09-03 DIAGNOSIS — E7800 Pure hypercholesterolemia, unspecified: Secondary | ICD-10-CM | POA: Diagnosis present

## 2021-09-03 DIAGNOSIS — I700 Atherosclerosis of aorta: Secondary | ICD-10-CM | POA: Diagnosis present

## 2021-09-03 DIAGNOSIS — Z833 Family history of diabetes mellitus: Secondary | ICD-10-CM

## 2021-09-03 DIAGNOSIS — E1165 Type 2 diabetes mellitus with hyperglycemia: Secondary | ICD-10-CM | POA: Diagnosis present

## 2021-09-03 DIAGNOSIS — D563 Thalassemia minor: Secondary | ICD-10-CM | POA: Diagnosis present

## 2021-09-03 DIAGNOSIS — I110 Hypertensive heart disease with heart failure: Secondary | ICD-10-CM | POA: Diagnosis present

## 2021-09-03 DIAGNOSIS — E662 Morbid (severe) obesity with alveolar hypoventilation: Secondary | ICD-10-CM | POA: Diagnosis present

## 2021-09-03 DIAGNOSIS — R6520 Severe sepsis without septic shock: Secondary | ICD-10-CM | POA: Diagnosis not present

## 2021-09-03 DIAGNOSIS — F17210 Nicotine dependence, cigarettes, uncomplicated: Secondary | ICD-10-CM | POA: Diagnosis present

## 2021-09-03 DIAGNOSIS — R6521 Severe sepsis with septic shock: Secondary | ICD-10-CM | POA: Diagnosis present

## 2021-09-03 DIAGNOSIS — Z7984 Long term (current) use of oral hypoglycemic drugs: Secondary | ICD-10-CM

## 2021-09-03 DIAGNOSIS — I87303 Chronic venous hypertension (idiopathic) without complications of bilateral lower extremity: Secondary | ICD-10-CM | POA: Diagnosis not present

## 2021-09-03 DIAGNOSIS — L0390 Cellulitis, unspecified: Secondary | ICD-10-CM

## 2021-09-03 LAB — CBC WITH DIFFERENTIAL/PLATELET
Abs Immature Granulocytes: 0.08 10*3/uL — ABNORMAL HIGH (ref 0.00–0.07)
Basophils Absolute: 0 10*3/uL (ref 0.0–0.1)
Basophils Relative: 0 %
Eosinophils Absolute: 0.1 10*3/uL (ref 0.0–0.5)
Eosinophils Relative: 0 %
HCT: 46.5 % (ref 39.0–52.0)
Hemoglobin: 13.1 g/dL (ref 13.0–17.0)
Immature Granulocytes: 1 %
Lymphocytes Relative: 4 %
Lymphs Abs: 0.5 10*3/uL — ABNORMAL LOW (ref 0.7–4.0)
MCH: 20.3 pg — ABNORMAL LOW (ref 26.0–34.0)
MCHC: 28.2 g/dL — ABNORMAL LOW (ref 30.0–36.0)
MCV: 72 fL — ABNORMAL LOW (ref 80.0–100.0)
Monocytes Absolute: 0.5 10*3/uL (ref 0.1–1.0)
Monocytes Relative: 3 %
Neutro Abs: 13.2 10*3/uL — ABNORMAL HIGH (ref 1.7–7.7)
Neutrophils Relative %: 92 %
Platelets: 238 10*3/uL (ref 150–400)
RBC: 6.46 MIL/uL — ABNORMAL HIGH (ref 4.22–5.81)
RDW: 20.4 % — ABNORMAL HIGH (ref 11.5–15.5)
WBC: 14.3 10*3/uL — ABNORMAL HIGH (ref 4.0–10.5)
nRBC: 0 % (ref 0.0–0.2)

## 2021-09-03 LAB — BLOOD GAS, ARTERIAL
Acid-Base Excess: 3 mmol/L — ABNORMAL HIGH (ref 0.0–2.0)
Acid-Base Excess: 6.6 mmol/L — ABNORMAL HIGH (ref 0.0–2.0)
Bicarbonate: 33.6 mmol/L — ABNORMAL HIGH (ref 20.0–28.0)
Bicarbonate: 29.9 mmol/L — ABNORMAL HIGH (ref 20.0–28.0)
Drawn by: 29503
FIO2: 48
FIO2: 100
MECHVT: 710 mL
O2 Content: 7 L/min
O2 Saturation: 93.8 %
O2 Saturation: 92.4 %
PEEP: 5 cmH2O
Patient temperature: 98.6
Patient temperature: 99.6
RATE: 22 resp/min
pCO2 arterial: 93 mmHg (ref 32.0–48.0)
pCO2 arterial: 39.5 mmHg (ref 32.0–48.0)
pH, Arterial: 7.183 — CL (ref 7.350–7.450)
pH, Arterial: 7.495 — ABNORMAL HIGH (ref 7.350–7.450)
pO2, Arterial: 85.8 mmHg (ref 83.0–108.0)
pO2, Arterial: 59.7 mmHg — ABNORMAL LOW (ref 83.0–108.0)

## 2021-09-03 LAB — COMPREHENSIVE METABOLIC PANEL
ALT: 26 U/L (ref 0–44)
AST: 26 U/L (ref 15–41)
Albumin: 3.5 g/dL (ref 3.5–5.0)
Alkaline Phosphatase: 107 U/L (ref 38–126)
Anion gap: 8 (ref 5–15)
BUN: 32 mg/dL — ABNORMAL HIGH (ref 8–23)
CO2: 33 mmol/L — ABNORMAL HIGH (ref 22–32)
Calcium: 9 mg/dL (ref 8.9–10.3)
Chloride: 97 mmol/L — ABNORMAL LOW (ref 98–111)
Creatinine, Ser: 1.17 mg/dL (ref 0.61–1.24)
GFR, Estimated: >60 mL/min (ref >60)
Glucose, Bld: 201 mg/dL — ABNORMAL HIGH (ref 70–99)
Potassium: 5.2 mmol/L — ABNORMAL HIGH (ref 3.5–5.1)
Sodium: 138 mmol/L (ref 135–145)
Total Bilirubin: 0.3 mg/dL (ref 0.3–1.2)
Total Protein: 8.2 g/dL — ABNORMAL HIGH (ref 6.5–8.1)

## 2021-09-03 LAB — RESP PANEL BY RT-PCR (FLU A&B, COVID) ARPGX2
Influenza A by PCR: NEGATIVE
Influenza B by PCR: NEGATIVE
SARS Coronavirus 2 by RT PCR: NEGATIVE

## 2021-09-03 LAB — URINALYSIS, ROUTINE W REFLEX MICROSCOPIC
Bilirubin Urine: NEGATIVE
Glucose, UA: NEGATIVE mg/dL
Hgb urine dipstick: NEGATIVE
Ketones, ur: NEGATIVE mg/dL
Leukocytes,Ua: NEGATIVE
Nitrite: NEGATIVE
Protein, ur: NEGATIVE mg/dL
Specific Gravity, Urine: 1.006 (ref 1.005–1.030)
pH: 5 (ref 5.0–8.0)

## 2021-09-03 LAB — CBC
HCT: 41.2 % (ref 39.0–52.0)
Hemoglobin: 11.4 g/dL — ABNORMAL LOW (ref 13.0–17.0)
MCH: 20.1 pg — ABNORMAL LOW (ref 26.0–34.0)
MCHC: 27.7 g/dL — ABNORMAL LOW (ref 30.0–36.0)
MCV: 72.5 fL — ABNORMAL LOW (ref 80.0–100.0)
Platelets: 212 10*3/uL (ref 150–400)
RBC: 5.68 MIL/uL (ref 4.22–5.81)
RDW: 20.2 % — ABNORMAL HIGH (ref 11.5–15.5)
WBC: 17.4 10*3/uL — ABNORMAL HIGH (ref 4.0–10.5)
nRBC: 0 % (ref 0.0–0.2)

## 2021-09-03 LAB — LACTIC ACID, PLASMA
Lactic Acid, Venous: 1.7 mmol/L (ref 0.5–1.9)
Lactic Acid, Venous: 2.2 mmol/L (ref 0.5–1.9)
Lactic Acid, Venous: 1.7 mmol/L (ref 0.5–1.9)

## 2021-09-03 LAB — CREATININE, SERUM
Creatinine, Ser: 1.3 mg/dL — ABNORMAL HIGH (ref 0.61–1.24)
GFR, Estimated: >60 mL/min (ref >60)

## 2021-09-03 LAB — APTT: aPTT: 25 seconds (ref 24–36)

## 2021-09-03 LAB — BRAIN NATRIURETIC PEPTIDE: B Natriuretic Peptide: 64.2 pg/mL (ref 0.0–100.0)

## 2021-09-03 LAB — BLOOD GAS, VENOUS
Acid-Base Excess: 3.4 mmol/L — ABNORMAL HIGH (ref 0.0–2.0)
Bicarbonate: 32.4 mmol/L — ABNORMAL HIGH (ref 20.0–28.0)
O2 Saturation: 76.3 %
Patient temperature: 98.6
pCO2, Ven: 76.3 mmHg (ref 44.0–60.0)
pH, Ven: 7.251 (ref 7.250–7.430)
pO2, Ven: 48.4 mmHg — ABNORMAL HIGH (ref 32.0–45.0)

## 2021-09-03 LAB — CBG MONITORING, ED: Glucose-Capillary: 185 mg/dL — ABNORMAL HIGH (ref 70–99)

## 2021-09-03 LAB — PROTIME-INR
INR: 1 (ref 0.8–1.2)
Prothrombin Time: 13.5 seconds (ref 11.4–15.2)

## 2021-09-03 MED ORDER — PROPOFOL 1000 MG/100ML IV EMUL
5.0000 ug/kg/min | INTRAVENOUS | Status: DC
Start: 2021-09-03 — End: 2021-09-05
  Administered 2021-09-03: 75 ug/kg/min via INTRAVENOUS
  Administered 2021-09-03: 5 ug/kg/min via INTRAVENOUS
  Administered 2021-09-04: 70 ug/kg/min via INTRAVENOUS
  Administered 2021-09-04: 60 ug/kg/min via INTRAVENOUS
  Administered 2021-09-04 (×3): 50 ug/kg/min via INTRAVENOUS
  Administered 2021-09-04: 65 ug/kg/min via INTRAVENOUS
  Administered 2021-09-04: 50 ug/kg/min via INTRAVENOUS
  Administered 2021-09-04: 65 ug/kg/min via INTRAVENOUS
  Administered 2021-09-04 – 2021-09-05 (×4): 50 ug/kg/min via INTRAVENOUS
  Filled 2021-09-03 (×15): qty 100

## 2021-09-03 MED ORDER — ROCURONIUM BROMIDE 10 MG/ML (PF) SYRINGE
PREFILLED_SYRINGE | INTRAVENOUS | Status: AC
  Administered 2021-09-03: 100 mg
  Filled 2021-09-03: qty 10

## 2021-09-03 MED ORDER — VANCOMYCIN HCL IN DEXTROSE 1-5 GM/200ML-% IV SOLN: 1000.0000 mg | Freq: Two times a day (BID) | INTRAVENOUS | Status: DC

## 2021-09-03 MED ORDER — SODIUM CHLORIDE 0.9 % IV SOLN
2.0000 g | Freq: Once | INTRAVENOUS | Status: AC
  Administered 2021-09-03: 2 g via INTRAVENOUS
  Filled 2021-09-03: qty 2

## 2021-09-03 MED ORDER — LACTATED RINGERS IV BOLUS (SEPSIS)
1000.0000 mL | Freq: Once | INTRAVENOUS | Status: AC
  Administered 2021-09-03: 1000 mL via INTRAVENOUS

## 2021-09-03 MED ORDER — METRONIDAZOLE 500 MG/100ML IV SOLN
500.0000 mg | Freq: Two times a day (BID) | INTRAVENOUS | Status: DC
  Administered 2021-09-04: 500 mg via INTRAVENOUS
  Filled 2021-09-03: qty 100

## 2021-09-03 MED ORDER — ETOMIDATE 2 MG/ML IV SOLN
INTRAVENOUS | Status: AC
  Administered 2021-09-03: 20 mg
  Filled 2021-09-03: qty 20

## 2021-09-03 MED ORDER — SODIUM CHLORIDE 0.9 % IV SOLN
2.0000 g | Freq: Three times a day (TID) | INTRAVENOUS | Status: DC
  Administered 2021-09-03 – 2021-09-04 (×2): 2 g via INTRAVENOUS
  Filled 2021-09-03 (×3): qty 2

## 2021-09-03 MED ORDER — ACETAMINOPHEN 650 MG RE SUPP
650.0000 mg | Freq: Once | RECTAL | Status: AC
  Administered 2021-09-03: 650 mg via RECTAL
  Filled 2021-09-03: qty 1

## 2021-09-03 MED ORDER — ENOXAPARIN SODIUM 40 MG/0.4ML IJ SOSY
40.0000 mg | PREFILLED_SYRINGE | INTRAMUSCULAR | Status: DC
  Administered 2021-09-03 – 2021-09-07 (×5): 40 mg via SUBCUTANEOUS
  Filled 2021-09-03 (×5): qty 0.4

## 2021-09-03 MED ORDER — MIDAZOLAM HCL 2 MG/2ML IJ SOLN
1.0000 mg | Freq: Once | INTRAMUSCULAR | Status: AC
  Administered 2021-09-03: 1 mg via INTRAVENOUS
  Filled 2021-09-03: qty 2

## 2021-09-03 MED ORDER — FENTANYL CITRATE PF 50 MCG/ML IJ SOSY
100.0000 ug | PREFILLED_SYRINGE | Freq: Once | INTRAMUSCULAR | Status: AC
  Administered 2021-09-03: 100 ug via INTRAVENOUS
  Filled 2021-09-03: qty 2

## 2021-09-03 MED ORDER — TAMSULOSIN HCL 0.4 MG PO CAPS
0.4000 mg | ORAL_CAPSULE | Freq: Every day | ORAL | Status: DC
  Administered 2021-09-04: 0.4 mg via ORAL
  Filled 2021-09-03: qty 1

## 2021-09-03 MED ORDER — VANCOMYCIN HCL 2000 MG/400ML IV SOLN
2000.0000 mg | Freq: Once | INTRAVENOUS | Status: AC
  Administered 2021-09-03: 2000 mg via INTRAVENOUS
  Filled 2021-09-03: qty 400

## 2021-09-03 MED ORDER — LACTATED RINGERS IV SOLN: INTRAVENOUS | Status: AC

## 2021-09-03 MED ORDER — DOCUSATE SODIUM 100 MG PO CAPS: 100.0000 mg | ORAL_CAPSULE | Freq: Two times a day (BID) | ORAL | Status: DC | PRN

## 2021-09-03 MED ORDER — POLYETHYLENE GLYCOL 3350 17 G PO PACK: 17.0000 g | PACK | Freq: Every day | ORAL | Status: DC | PRN

## 2021-09-03 MED ORDER — VANCOMYCIN HCL IN DEXTROSE 1-5 GM/200ML-% IV SOLN: 1000.0000 mg | Freq: Once | INTRAVENOUS | Status: DC

## 2021-09-03 MED ORDER — METRONIDAZOLE 500 MG/100ML IV SOLN
500.0000 mg | Freq: Once | INTRAVENOUS | Status: AC
  Administered 2021-09-03: 500 mg via INTRAVENOUS
  Filled 2021-09-03: qty 100

## 2021-09-03 MED ORDER — NOREPINEPHRINE 4 MG/250ML-% IV SOLN
0.0000 ug/min | INTRAVENOUS | Status: DC
  Administered 2021-09-03: 2 ug/min via INTRAVENOUS
  Filled 2021-09-03: qty 250

## 2021-09-03 MED ORDER — MIDAZOLAM HCL (PF) 5 MG/ML IJ SOLN: 1.0000 mg | Freq: Once | INTRAMUSCULAR | Status: DC

## 2021-09-03 MED ORDER — INSULIN ASPART 100 UNIT/ML IJ SOLN
0.0000 [IU] | INTRAMUSCULAR | Status: DC
  Administered 2021-09-04: 2 [IU] via SUBCUTANEOUS
  Administered 2021-09-04: 3 [IU] via SUBCUTANEOUS
  Administered 2021-09-04: 8 [IU] via SUBCUTANEOUS
  Administered 2021-09-04: 2 [IU] via SUBCUTANEOUS
  Administered 2021-09-04 – 2021-09-05 (×3): 3 [IU] via SUBCUTANEOUS
  Administered 2021-09-05 (×4): 5 [IU] via SUBCUTANEOUS
  Administered 2021-09-06: 2 [IU] via SUBCUTANEOUS
  Administered 2021-09-06 (×2): 5 [IU] via SUBCUTANEOUS
  Administered 2021-09-06: 8 [IU] via SUBCUTANEOUS
  Administered 2021-09-06: 5 [IU] via SUBCUTANEOUS
  Administered 2021-09-07 (×4): 2 [IU] via SUBCUTANEOUS
  Administered 2021-09-07: 3 [IU] via SUBCUTANEOUS
  Administered 2021-09-07 – 2021-09-08 (×4): 2 [IU] via SUBCUTANEOUS
  Filled 2021-09-03: qty 0.15

## 2021-09-03 NOTE — Progress Notes (Signed)
VBG resulted. ABG discontinued for now.

## 2021-09-03 NOTE — Assessment & Plan Note (Signed)
Continue wound care. ?

## 2021-09-03 NOTE — Assessment & Plan Note (Signed)
--   Difficult to assess volume status but felt to be at this point

## 2021-09-03 NOTE — ED Notes (Signed)
[name] (brother)  [phone]

## 2021-09-03 NOTE — Assessment & Plan Note (Signed)
--   Anion gap within normal limits, CBG stable, sliding scale insulin recommended

## 2021-09-03 NOTE — Progress Notes (Signed)
Notified provider and bedside nurse of need to order and draw repeat lactic acid #3.  

## 2021-09-03 NOTE — Assessment & Plan Note (Signed)
--   Wounds inspected, see pictures, the left lower extremity wound does have some purulence although there is no clear surrounding erythema, there is significant brawny edema making evaluation difficult.  No abscess appreciated.  Dorsalis pedal pulses 2+ on the left leg and perfusion appears intact. -- Empiric antibiotics.  Wound care RN.  Could consider vascular assessment although previous wound care notes noted to venous etiology.

## 2021-09-03 NOTE — Assessment & Plan Note (Addendum)
--   2 L as needed baseline

## 2021-09-03 NOTE — ED Triage Notes (Signed)
Patient BIBA from [facility], staff called d/t AMS since this morning. EMS also reports pt SpO2 80% on RA. Patient has bilateral LE wounds and staff notes foul-smelling odor/drainage. Pt normal Aox4 at baseline.   160//86 HR 118 RR 40 96% 6 L [place] RR 40 CBG 223 GCS 13  20 G LAC  500 cc NS

## 2021-09-03 NOTE — Assessment & Plan Note (Signed)
--   Presumably secondary to left lower extremity cellulitis, chest x-ray was clear.  Cultures pending.  Broad-spectrum antibiotics.  Received appropriate fluid resuscitation.  Not hypotensive.

## 2021-09-03 NOTE — Assessment & Plan Note (Signed)
--   No treatment indicated 

## 2021-09-03 NOTE — Progress Notes (Signed)
A consult was received from an ED physician for Cefepime & Vancomycin per pharmacy dosing.  The patient's profile has been reviewed for ht/wt/allergies/indication/available labs.    A one time order has been placed for Cefepime 2gm & Vancomycin 2gm, Flagyl 500mg  x1 per MD.  Further antibiotics/pharmacy consults should be ordered by admitting physician if indicated.                       Thank you,  Minda Ditto PharmD 09/03/2021  1:24 PM

## 2021-09-03 NOTE — ED Notes (Signed)
Pt daughterDozier [name] [phone] and [phone]. Pt daughter lives in [place]

## 2021-09-03 NOTE — ED Provider Notes (Signed)
I was called to evaluate this patient by the admitting hospitalist.  Per the hospitalist, the patient was very somnolent evaluate any history.  ABG was pursued concerning pH 7.183, PCO2 93.  On my evaluation patient appears obtunded, would open his eyes to painful stimuli, but then just go back to sleep again.  Decision to intubate the patient for hypercapnic respiratory failure.  Procedure Name: Intubation Date/Time: 09/03/2021 6:31 PM Performed by: Luna Fuse, MD Pre-anesthesia Checklist: Patient identified and Emergency Drugs available Oxygen Delivery Method: Non-rebreather mask Preoxygenation: Pre-oxygenation with 100% oxygen Induction Type: Rapid sequence Comments: Patient pretreated with f nonrebreather preoxygenation.  Induction with etomidate 20 mg and rocuronium 100 mg.  Patient intubated with glide scope 7.5 ET tube.  Secured at 23 at the lips.  Breath sounds equal bilaterally no desaturations noted.  Tube secured and subsequent chest x-ray ordered.        Luna Fuse, MD 09/03/21 [phone]

## 2021-09-03 NOTE — Consult Note (Signed)
NAME:  [name], MRN:  [id number], DOB:  1956-08-24, LOS: 0 ADMISSION DATE:  09/03/2021, CONSULTATION DATE:  12/12 REFERRING MD:  Dr. Sarajane Jews TRH, CHIEF COMPLAINT:  AMS   History of Present Illness:  Patient is encephalopathic and/or intubated. Therefore history has been obtained from chart review.   65 year old male with PMH as below, which is significant for HFpEF, HTN, OSA, DM, chronic hypoxemic respiratory failure on 2L, and previous cellulitis of the lower extremity.   He resides in SNF. In the AM hours of 12/12 he developed rigors and SNF staff noted his temperature to be 104F. Also noted bilatera lower extremity wounds with malodorous discharge.  He was last known well in the early AM hours when he was seen outside smoking by staff. At baseline he is alert and oriented x4. Upon arrival to the ED he was febrile. Lower extremity dressings were taken down and purulent drainage was noted.   He was set to be admitted to the hospitalist service for treatment of sepsis secondary to bilateral lower extremity cellulitis, however, he subsequently became somnolent and eventually unarousable. ABG showed severe respiratory acidosis. The patient was intubated by the ED physician and PCCM was asked to transfer the patient to ICU.   Febrile in ED  Initially tachy 120 7.183/93/85.8 Prior CO2 on ABG 52-60s compensated 4L LR ?, 112ml/hr  Flagyl, cefepime, vanc  Pertinent  Medical History   has a past medical history of Anemia, Arthritis, Cellulitis and abscess of leg (hositalized 04/25/2015), Chronic diastolic CHF (congestive heart failure) ([place]) (06/25/2021), Chronic hip pain, Depression, GERD (gastroesophageal reflux disease), Hypercholesterolemia, Hypertension, Pneumonia (1960's X 1), Primary skin malignancy with unknown cell type (12/10/2016), Sleep apnea, Thalassemia minor, and Type II diabetes mellitus ([place]).  On 2L at home, unclear why.  No signs of emphysema on CT   CXR - R sided "effusion"  actually looks like R hemidiaphragm elevation on prior CT similar scout film findings, no effusion  Significant Hospital Events: Including procedures, antibiotic start and stop dates in addition to other pertinent events     Interim History / Subjective:    Objective   Blood pressure 133/70, pulse 100, temperature (!) 101.5 F (38.6 C), temperature source Rectal, resp. rate 16, height 6\' 5"  (1.956 m), weight 120 kg, SpO2 100 %.    FiO2 (%):  [100 %] 100 % Set Rate:  [22 bmp] 22 bmp Vt Set:  [710 mL] 710 mL PEEP:  [5 cmH20] 5 cmH20 Plateau Pressure:  [27 cmH20] 27 cmH20  No intake or output data in the 24 hours ending 09/03/21 1853 Filed Weights   09/03/21 1837  Weight: 120 kg    Examination: General: sedated, intubated, intermittently agitated.  HENT: NCAT, some nasal discharge, frothy fluid in ET tube (suctioned) Lungs: B crackles, no wheezes Cardiovascular: rrr no mgr  Abdomen: nt, nd, obese Extremities: B thiickened skin, B purulent wounds on anterior LE.  Neuro: sedated on propofol.  GU:   Resolved Hospital Problem list     Assessment & Plan:  Sepsis,  shock: received 4L fluids.  Source possibly LE wounds.  Cefepime, flagyl, vanc started in ED.  WBC 14.3 Lactic 1.7--> 2.2 --> 1.7  Started on low dose levophed in ED.  Cont propofol for now.   Elevated Cr 1.17 from 0.78   Acute on chronic mixed respiratory failure Intubated for Hypercarbic respiratory failure (found increasingly obtunded).   Large A-a gradient Obesity hypoventilation related?  Possibly worsened due to worsening mental status  from sepsis?  Old CT doesn't show signs of emphysema, is a smoker, chronically elevated CO2, usually compensated, doesn't take COPD inhalers, no official diagnosis of pulmonary problems in chart, uses supplemental oxygen.  Consider PHTN as source of hypoxemia.   PE possible.  Hold off on CTA given Cr elevation.   Echo pending. Last 2017. Images show crhonically elevated R  hemidiaphragm, old CT shows similar findings with no effusion.   Check non con CT for now, if no reason for hypoxemia, consider VQ scan.    Acute metabolic encephalopathy. Likely 2/2 sepsis.   OSA  HFpEF  Dysphagia: mechanical soft diet at baseline  DM  Best Practice (right click and "Reselect all SmartList Selections" daily)   Diet/type: NPO DVT prophylaxis: prophylactic heparin  GI prophylaxis: PPI Lines: N/A Foley:  N/A Code Status:  full code Last date of multidisciplinary goals of care discussion []   Labs   CBC: Recent Labs  Lab 09/03/21 1321  WBC 14.3*  NEUTROABS 13.2*  HGB 13.1  HCT 46.5  MCV 72.0*  PLT 983    Basic Metabolic Panel: Recent Labs  Lab 09/03/21 1321  NA 138  K 5.2*  CL 97*  CO2 33*  GLUCOSE 201*  BUN 32*  CREATININE 1.17  CALCIUM 9.0   GFR: Estimated Creatinine Clearance: 88 mL/min (by C-G formula based on SCr of 1.17 mg/dL). Recent Labs  Lab 09/03/21 1321 09/03/21 1601  WBC 14.3*  --   LATICACIDVEN 1.7 2.2*    Liver Function Tests: Recent Labs  Lab 09/03/21 1321  AST 26  ALT 26  ALKPHOS 107  BILITOT 0.3  PROT 8.2*  ALBUMIN 3.5   No results for input(s): LIPASE, AMYLASE in the last 168 hours. No results for input(s): AMMONIA in the last 168 hours.  ABG    Component Value Date/Time   PHART 7.183 (LL) 09/03/2021 1730   PCO2ART 93.0 (HH) 09/03/2021 1730   PO2ART 85.8 09/03/2021 1730   HCO3 33.6 (H) 09/03/2021 1730   TCO2 21 11/02/2015 2039   ACIDBASEDEF 6.0 (H) 11/02/2015 2039   O2SAT 93.8 09/03/2021 1730     Coagulation Profile: Recent Labs  Lab 09/03/21 1321  INR 1.0    Cardiac Enzymes: No results for input(s): CKTOTAL, CKMB, CKMBINDEX, TROPONINI in the last 168 hours.  HbA1C: Hgb A1c MFr Bld  Date/Time Value Ref Range Status  06/25/2021 01:10 AM 8.4 (H) 4.8 - 5.6 % Final    Comment:    (NOTE) Pre diabetes:          5.7%-6.4%  Diabetes:              >6.4%  Glycemic control for   <7.0% adults  with diabetes   02/13/2019 10:15 AM 8.0 (H) 4.8 - 5.6 % Final    Comment:    (NOTE) Pre diabetes:          5.7%-6.4% Diabetes:              >6.4% Glycemic control for   <7.0% adults with diabetes     CBG: Recent Labs  Lab 09/03/21 1458  GLUCAP 185*    Review of Systems:   Patient is encephalopathic and/or intubated. Therefore history has been obtained from chart review.    Past Medical History:  He,  has a past medical history of Anemia, Arthritis, Cellulitis and abscess of leg (hositalized 04/25/2015), Chronic diastolic CHF (congestive heart failure) ([place]) (06/25/2021), Chronic hip pain, Depression, GERD (gastroesophageal reflux disease), Hypercholesterolemia, Hypertension, Pneumonia (1960's X  1), Primary skin malignancy with unknown cell type (12/10/2016), Sleep apnea, Thalassemia minor, and Type II diabetes mellitus ([place]).   Surgical History:   Past Surgical History:  Procedure Laterality Date   INGUINAL HERNIA REPAIR Right ~ 2004   SHOULDER ARTHROSCOPY W/ ROTATOR CUFF REPAIR Right ~ 2004   TOENAIL EXCISION Right 1980's X 2   "big toe"     Social History:   reports that he has been smoking cigarettes. He has a 5.40 pack-year smoking history. He has never used smokeless tobacco. He reports that he does not drink alcohol and does not use drugs.   Family History:  His family history includes Diabetes Mellitus II in his brother, father, and mother.   Allergies Allergies  Allergen Reactions   Other     Pt is a Jehovah Witness. No blood products.   Sulfa Antibiotics     Causes flu-like symptom : sweating, chills, fever, body aches     Home Medications  Prior to Admission medications   Medication Sig Start Date End Date Taking? Authorizing Provider  acetaminophen (TYLENOL) 325 MG tablet Take 2 tablets (650 mg total) by mouth every 6 (six) hours as needed for mild pain (or Fever >/= 101). 03/23/15   Robbie Lis, MD  ARIPiprazole (ABILIFY) 2 MG tablet Take 2 mg by mouth  at bedtime. 04/27/21   [provider]  aspirin EC 81 MG tablet Take 81 mg by mouth every morning.    [provider]  benzocaine (ORAJEL) 10 % mucosal gel Use as directed 1 application in the mouth or throat every 2 (two) hours as needed for mouth pain (soreness).    [provider]  betamethasone dipropionate 0.05 % lotion Apply 1 application topically every 12 (twelve) hours as needed (itching). 06/07/21   [provider]  chlorhexidine (PERIDEX) 0.12 % solution Use as directed 5 mLs in the mouth or throat 2 (two) times daily.    [provider]  Cholecalciferol (VITAMIN D) 50 MCG (2000 UT) tablet Take 2,000 Units by mouth daily.    [provider]  ferrous sulfate 325 (65 FE) MG tablet Take 325 mg by mouth daily.    [provider]  furosemide (LASIX) 20 MG tablet Take 60 mg by mouth 2 (two) times daily. 05/16/21   [provider]  gabapentin (NEURONTIN) 300 MG capsule Take 300 mg by mouth 3 (three) times daily. 05/16/21   [provider]  Glucagon HCl (GLUCAGON EMERGENCY) 1 MG/ML SOLR Inject 1 mg as directed every 6 (six) hours as needed (low CBG).    [provider]  hydrocortisone (ANUSOL-HC) 2.5 % rectal cream Apply 1 application topically every 12 (twelve) hours as needed (rash). 02/10/21   [provider]  hydrocortisone (ANUSOL-HC) 2.5 % rectal cream Place 1 application rectally daily as needed for hemorrhoids or anal itching.    [provider]  insulin aspart (NOVOLOG) 100 UNIT/ML injection Before each meal 3 times a day, 140-199 - 2 units, 200-250 - 4 units, 251-299 - 6 units,  300-349 - 8 units,  350 or above 10 units. Dispense syringes and needles as needed, Ok to switch to PEN if approved. Substitute to any brand approved. DX DM2, Code E11.65 Patient taking differently: Inject 14 Units into the skin as directed. Inject 14 units TID & Sliding Scale QID: If 150-200=2 units, 201-250=5  units, 251-300=10 units, 301-350=15 units If > 350=20 units 11/13/15   Thurnell Lose, MD  Insulin Glargine Nancee Liter  KWIKPEN) 100 UNIT/ML Inject 30 Units into the skin 2 (two) times daily. 06/11/21   [provider]  lisinopril (PRINIVIL,ZESTRIL) 2.5 MG tablet Take 1 tablet (2.5 mg total) by mouth daily. 11/13/15   Thurnell Lose, MD  melatonin 3 MG TABS tablet Take 3 mg by mouth at bedtime.    [provider]  METAMUCIL FIBER PO Take 2 Wafers by mouth in the morning and at bedtime.    [provider]  metFORMIN (GLUCOPHAGE) 500 MG tablet Take 500 mg by mouth 2 (two) times daily with a meal. 10/15/17   [provider]  Multiple Vitamin (MULTIVITAMIN WITH MINERALS) TABS tablet Take 1 tablet by mouth daily. 03/23/15   Robbie Lis, MD  nitroGLYCERIN (NITROSTAT) 0.4 MG SL tablet Place 0.4 mg under the tongue every 5 (five) minutes as needed for chest pain.    [provider]  tamsulosin (FLOMAX) 0.4 MG CAPS capsule Take 0.8 mg by mouth at bedtime.    [provider]  tiZANidine (ZANAFLEX) 4 MG tablet Take 4 mg by mouth every 8 (eight) hours as needed for muscle spasms.    [provider]  TRADJENTA 5 MG TABS tablet Take 5 mg by mouth daily. 05/04/21   [provider]  traZODone (DESYREL) 50 MG tablet Take 25 mg by mouth at bedtime as needed. 06/08/21   [provider]  triamcinolone cream (KENALOG) 0.1 % Apply 1 application topically 2 (two) times daily. 06/18/21   [provider]  VIIBRYD 40 MG TABS Take 40 mg by mouth daily. 05/16/21   [provider]     Critical care time: 60 minutes      ,

## 2021-09-03 NOTE — Assessment & Plan Note (Signed)
--   Secondary to sepsis, hypercapnic respiratory failure.

## 2021-09-03 NOTE — Assessment & Plan Note (Signed)
--   Baseline oxygen need 2 L as needed, smoker, no clear history of COPD noted in chart but was admitted earlier this year for acute on chronic hypoxic and hypercapnic respiratory failure. -- Presumed secondary to undiagnosed COPD exacerbation, suspect obstructive sleep apnea by body habitus, also OHS a possibility. -- Minimally opens eyes, poor candidate for BiPAP, discussed with ED, plan for intubation, will defer further care to ED and critical care.

## 2021-09-03 NOTE — Progress Notes (Signed)
Pharmacy Antibiotic Note  [name] is a 65 y.o. male admitted on 09/03/2021 with sepsis likely from bilateral cellulitis.  Pharmacy has been consulted for Vancomycin & Cefepime dosing.  Scr slightly above patient's baseline, Febrile on admission.   Plan: Cefepime 2gm IV q8h Vancomycin 1gm IV q12h Check Vancomycin level at steady state Monitor renal function and cx data   Height: 6\' 5"  (195.6 cm) Weight: 120 kg (264 lb 8.8 oz) IBW/kg (Calculated) : 89.1  Temp (24hrs), Avg:99.3 F (37.4 C), Min:97.9 F (36.6 C), Max:104 F (40 C)  Recent Labs  Lab 09/03/21 1321 09/03/21 1601 09/03/21 1836  WBC 14.3*  --   --   CREATININE 1.17  --   --   LATICACIDVEN 1.7 2.2* 1.7    Estimated Creatinine Clearance: 88 mL/min (by C-G formula based on SCr of 1.17 mg/dL).    Allergies  Allergen Reactions   Other Other (See Comments)    Pt is a Jehovah Witness. No blood products.   Sulfa Antibiotics Other (See Comments)    Causes flu-like symptom : sweating, chills, fever, body aches    Antimicrobials this admission: 12/12 Cefepime >>  12/12 Vancomycin >>  12/12 Flagyl >>  Dose adjustments this admission:  Microbiology results: 12/12 BCx:  12/12 UCx:    Thank you for allowing pharmacy to be a part of this patient's care.  Netta Cedars PharmD 09/03/2021 9:52 PM

## 2021-09-03 NOTE — H&P (Addendum)
Consult Note   [name] EXH:[id number] DOB: 19-Mar-1956 DOA: 09/03/2021  PCP: [name], MD  Requesting physician Lacretia Leigh, MD Reason for consultation sepsis  Chief Complaint: fever  HPI: 65 year old man resident at [facility], complicated past medical history including diastolic CHF, chronic hypoxic respiratory failure, chronic hypercapnic respiratory failure, diabetes, thalassemia this morning at the nursing home developed shaking, rigors, no loss of consciousness, normal mentation, was sent to the emergency department where he was found to be markedly febrile up to 104, somnolent, bilateral lower extremity wounds, the left with foul discharge.  Patient is somnolent, does not speak and is unable to offer any history.  History obtained from [place] RN who reports the patient was in usual state of health yesterday, at baseline he is able to pivot, but usually uses a walker or a wheelchair.  He often is outside at night smoking.  This morning he was outside smoking, came back inside.  Around 1130 she noted rigors and was unable to get vital signs on him because of shaking.  Mentation was intact and he was speaking normally at that time.  He was therefore sent to the emergency department.  No clear power of attorney on file, 2 brothers numbers given.  Eats a mechanical soft diet, uses oxygen 2 L as needed.  Initial diagnosis sepsis secondary to left lower extremity wound, acute on chronic hypoxic and hypercapnic respiratory failure.  On further investigation in the emergency department ABG revealed marked respiratory acidosis with 7.1 pH, PCO2 90.  Very poor responsiveness.  No advance care planning documents on file.  No clear healthcare power of attorney.  I left messages for both brothers and numbers provided, no answer.  Previous CODE STATUS is all listed as full.  Presumptive full code for now.  Case discussed with Dr. Almyra Free in the emergency department, patient a  poor candidate for BiPAP given somnolence and concern for airway, recommend intubation.  Dr. Almyra Free will assume care and discuss with critical care.   Review of Systems: Unobtainable except as above   Allergies  Allergen Reactions   Other     Pt is a Jehovah Witness. No blood products.   Sulfa Antibiotics     Causes flu-like symptom : sweating, chills, fever, body aches   Past Medical History:  Diagnosis Date   Anemia    Arthritis    "left hip" (04/26/2015)   Cellulitis and abscess of leg hositalized 04/25/2015   left   Chronic diastolic CHF (congestive heart failure) ([place]) 06/25/2021   Chronic hip pain    Depression    GERD (gastroesophageal reflux disease)    Hypercholesterolemia    Hypertension    Pneumonia 1960's X 1   Primary skin malignancy with unknown cell type 12/10/2016   Sleep apnea    "couldn't wear the mask" (04/26/2015)   Thalassemia minor    Type II diabetes mellitus ([place])    Past Surgical History:  Procedure Laterality Date   INGUINAL HERNIA REPAIR Right ~ 2004   SHOULDER ARTHROSCOPY W/ ROTATOR CUFF REPAIR Right ~ 2004   TOENAIL EXCISION Right 1980's X 2   "big toe"    reports that he has been smoking cigarettes. He has a 5.40 pack-year smoking history. He has never used smokeless tobacco. He reports that he does not drink alcohol and does not use drugs.  Family History  Problem Relation Age of Onset   Diabetes Mellitus II Mother    Diabetes Mellitus II Father  Diabetes Mellitus II Brother     Prior to Admission medications   Medication Sig Start Date End Date Taking? Authorizing Provider  acetaminophen (TYLENOL) 325 MG tablet Take 2 tablets (650 mg total) by mouth every 6 (six) hours as needed for mild pain (or Fever >/= 101). 03/23/15   Robbie Lis, MD  ARIPiprazole (ABILIFY) 2 MG tablet Take 2 mg by mouth at bedtime. 04/27/21   [provider]  aspirin EC 81 MG tablet Take 81 mg by mouth every morning.    [provider]  benzocaine  (ORAJEL) 10 % mucosal gel Use as directed 1 application in the mouth or throat every 2 (two) hours as needed for mouth pain (soreness).    [provider]  betamethasone dipropionate 0.05 % lotion Apply 1 application topically every 12 (twelve) hours as needed (itching). 06/07/21   [provider]  chlorhexidine (PERIDEX) 0.12 % solution Use as directed 5 mLs in the mouth or throat 2 (two) times daily.    [provider]  Cholecalciferol (VITAMIN D) 50 MCG (2000 UT) tablet Take 2,000 Units by mouth daily.    [provider]  ferrous sulfate 325 (65 FE) MG tablet Take 325 mg by mouth daily.    [provider]  furosemide (LASIX) 20 MG tablet Take 60 mg by mouth 2 (two) times daily. 05/16/21   [provider]  gabapentin (NEURONTIN) 300 MG capsule Take 300 mg by mouth 3 (three) times daily. 05/16/21   [provider]  Glucagon HCl (GLUCAGON EMERGENCY) 1 MG/ML SOLR Inject 1 mg as directed every 6 (six) hours as needed (low CBG).    [provider]  hydrocortisone (ANUSOL-HC) 2.5 % rectal cream Apply 1 application topically every 12 (twelve) hours as needed (rash). 02/10/21   [provider]  hydrocortisone (ANUSOL-HC) 2.5 % rectal cream Place 1 application rectally daily as needed for hemorrhoids or anal itching.    [provider]  insulin aspart (NOVOLOG) 100 UNIT/ML injection Before each meal 3 times a day, 140-199 - 2 units, 200-250 - 4 units, 251-299 - 6 units,  300-349 - 8 units,  350 or above 10 units. Dispense syringes and needles as needed, Ok to switch to PEN if approved. Substitute to any brand approved. DX DM2, Code E11.65 Patient taking differently: Inject 14 Units into the skin as directed. Inject 14 units TID & Sliding Scale QID: If 150-200=2 units, 201-250=5 units, 251-300=10 units, 301-350=15 units If > 350=20 units 11/13/15   Thurnell Lose, MD  Insulin Glargine (BASAGLAR KWIKPEN) 100 UNIT/ML Inject 30  Units into the skin 2 (two) times daily. 06/11/21   [provider]  lisinopril (PRINIVIL,ZESTRIL) 2.5 MG tablet Take 1 tablet (2.5 mg total) by mouth daily. 11/13/15   Thurnell Lose, MD  melatonin 3 MG TABS tablet Take 3 mg by mouth at bedtime.    [provider]  METAMUCIL FIBER PO Take 2 Wafers by mouth in the morning and at bedtime.    [provider]  metFORMIN (GLUCOPHAGE) 500 MG tablet Take 500 mg by mouth 2 (two) times daily with a meal. 10/15/17   [provider]  Multiple Vitamin (MULTIVITAMIN WITH MINERALS) TABS tablet Take 1 tablet by mouth daily. 03/23/15   Robbie Lis, MD  nitroGLYCERIN (NITROSTAT) 0.4 MG SL tablet Place 0.4 mg under the tongue every 5 (five) minutes as needed for chest pain.    [provider]  tamsulosin (FLOMAX) 0.4 MG CAPS  capsule Take 0.8 mg by mouth at bedtime.    [provider]  tiZANidine (ZANAFLEX) 4 MG tablet Take 4 mg by mouth every 8 (eight) hours as needed for muscle spasms.    [provider]  TRADJENTA 5 MG TABS tablet Take 5 mg by mouth daily. 05/04/21   [provider]  traZODone (DESYREL) 50 MG tablet Take 25 mg by mouth at bedtime as needed. 06/08/21   [provider]  triamcinolone cream (KENALOG) 0.1 % Apply 1 application topically 2 (two) times daily. 06/18/21   [provider]  VIIBRYD 40 MG TABS Take 40 mg by mouth daily. 05/16/21   [provider]    Physical Exam: Vitals:   09/03/21 1615 09/03/21 1645 09/03/21 1730 09/03/21 1745  BP: 126/74 (!) 128/59 130/60 133/70  Pulse: (!) 101 (!) 103 (!) 104 100  Resp: 19 16 16 16   Temp:      TempSrc:      SpO2: 93% 94% 97% 98%   Physical Exam Vitals reviewed.  Constitutional:      General: He is in acute distress.     Appearance: He is ill-appearing and toxic-appearing.     Comments: Does not arouse to voice or sternal rub.  Mouth open with some noted secretions.  HENT:     Head:  Normocephalic.     Nose: Nose normal.     Mouth/Throat:     Mouth: Mucous membranes are moist.  Eyes:     General: No scleral icterus.       Right eye: No discharge.        Left eye: No discharge.  Cardiovascular:     Rate and Rhythm: Normal rate and regular rhythm.     Heart sounds: No murmur [place].   No friction rub. No gallop.  Pulmonary:     Effort: Respiratory distress present.     Breath sounds: No wheezing, rhonchi or rales.  Abdominal:     General: There is no distension.     Palpations: Abdomen is soft.     Tenderness: There is no abdominal tenderness. There is no guarding.     Hernia: A hernia (umblical hernia) is present.  Musculoskeletal:     Cervical back: No tenderness.     Right lower leg: Edema present.     Left lower leg: Edema present.  Lymphadenopathy:     Cervical: No cervical adenopathy.  Skin:    General: Skin is warm.  Neurological:     Comments: Does not arouse to tactile stimulation.  Does not answer questions.  Psychiatric:     Comments: Cannot assess secondary to metabolic encephalopathy.         Labs on Admission: I have personally reviewed the patients's labs and imaging studies.  Assessment/Plan Principal Problem:   Severe sepsis (HCC) Active Problems:   Cellulitis of left lower extremity   Acute on chronic respiratory failure with hypoxia and hypercapnia (HCC)   Acute respiratory acidosis (HCC)   Acute metabolic encephalopathy   DM2 (diabetes mellitus, type 2) (HCC)   Stasis edema of both lower extremities   Chronic respiratory failure with hypoxia (HCC)   Chronic diastolic CHF (congestive heart failure) (HCC)   Nonhealing ulcer of left lower extremity (HCC)   Aortic atherosclerosis (HCC)   Severe sepsis (HCC) -- Presumably secondary to left lower extremity cellulitis, chest x-ray was clear.  Cultures pending.  Broad-spectrum antibiotics.  Received appropriate fluid resuscitation.  Not hypotensive.  Cellulitis of left lower  extremity -- Wounds inspected, see pictures, the left lower extremity wound does have some purulence although there is no clear surrounding erythema, there is significant brawny edema making evaluation difficult.  No abscess appreciated.  Dorsalis pedal pulses 2+ on the left leg and perfusion appears intact. -- Empiric antibiotics.  Wound care RN.  Could consider vascular assessment although previous wound care notes noted to venous etiology.  Acute on chronic respiratory failure with hypoxia and hypercapnia (HCC) -- Baseline oxygen need 2 L as needed, smoker, no clear history of COPD noted in chart but was admitted earlier this year for acute on chronic hypoxic and hypercapnic respiratory failure. -- Presumed secondary to undiagnosed COPD exacerbation, suspect obstructive sleep apnea by body habitus, also OHS a possibility. -- Minimally opens eyes, poor candidate for BiPAP, discussed with ED, plan for intubation, will defer further care to ED and critical care.  Acute respiratory acidosis (HCC) -- As per above, plan for intubation.  Acute metabolic encephalopathy -- Secondary to sepsis, hypercapnic respiratory failure.  Chronic diastolic CHF (congestive heart failure) (HCC) -- Difficult to assess volume status but felt to be at this point  Chronic respiratory failure with hypoxia ([place]) -- 2 L as needed baseline   DM2 (diabetes mellitus, type 2) (HCC) -- Anion gap within normal limits, CBG stable, sliding scale insulin recommended  Stasis edema of both lower extremities -- Continue wound care  Aortic atherosclerosis (HCC) -- No treatment indicated  Initial diagnosis sepsis secondary to left lower extremity wound, acute on chronic hypoxic and hypercapnic respiratory failure.  On further investigation in the emergency department ABG revealed marked respiratory acidosis with 7.1 pH, PCO2 90.  Very poor responsiveness.  No advance care planning documents on file.  No clear healthcare power  of attorney.  I left messages for both brothers and numbers provided, no answer.  Previous CODE STATUS is all listed as full.  Presumptive full code for now.  Case discussed with Dr. Almyra Free in the emergency department, patient a poor candidate for BiPAP given somnolence and concern for airway, recommend intubation.  Dr. Almyra Free will assume care and discuss with critical care.  Time 90 minutes  Murray Hodgkins MD [facility] If 7PM-7AM, please contact night-coverage [email or web address]  09/03/2021, 6:26 PM

## 2021-09-03 NOTE — ED Notes (Signed)
Almyra Free, MD and RT at bedside for intubation

## 2021-09-03 NOTE — Progress Notes (Signed)
Elink following code sepsis °

## 2021-09-03 NOTE — Hospital Course (Addendum)
65 year old man resident at [facility], complicated past medical history including diastolic CHF, chronic hypoxic respiratory failure, chronic hypercapnic respiratory failure, diabetes, thalassemia this morning at the nursing home developed shaking, rigors, no loss of consciousness, normal mentation, was sent to the emergency department where he was found to be markedly febrile up to 104, somnolent, bilateral lower extremity wounds, the left with foul discharge.  Patient is somnolent, does not speak and is unable to offer any history.  History obtained from [place] RN who reports the patient was in usual state of health yesterday, at baseline he is able to pivot, but usually uses a walker or a wheelchair.  He often is outside at night smoking.  This morning he was outside smoking, came back inside.  Around 1130 she noted rigors and was unable to get vital signs on him because of shaking.  Mentation was intact and he was speaking normally at that time.  He was therefore sent to the emergency department.  No clear power of attorney on file, 2 brothers numbers given.  Eats a mechanical soft diet, uses oxygen 2 L as needed.  Initial diagnosis sepsis secondary to left lower extremity wound, acute on chronic hypoxic and hypercapnic respiratory failure.  On further investigation in the emergency department ABG revealed marked respiratory acidosis with 7.1 pH, PCO2 90.  Very poor responsiveness.  No advance care planning documents on file.  No clear healthcare power of attorney.  I left messages for both brothers and numbers provided, no answer.  Previous CODE STATUS is all listed as full.  Presumptive full code for now.  Case discussed with Dr. Almyra Free in the emergency department, patient a poor candidate for BiPAP given somnolence and concern for airway, recommend intubation.  Dr. Almyra Free will assume care and discuss with critical care.

## 2021-09-03 NOTE — Assessment & Plan Note (Signed)
--   As per above, plan for intubation.

## 2021-09-03 NOTE — Progress Notes (Signed)
Notified bedside nurse of need to draw repeat lactic acid (#3). 

## 2021-09-03 NOTE — ED Provider Notes (Addendum)
[facility] DEPT Provider Note   CSN: [id number] Arrival date & time: 09/03/21  1300     History No chief complaint on file.   [name] is a 65 y.o. male.  65 year old male presented from nursing home due to 1 day history of altered mental status.  Patient was warm to the touch there.  Patient has chronic bilateral lower extremity wounds.  Comes from a facility with a called EMS and O2 sat was 80%.  Patient normally is ANO x4 at baseline.  Patient denies any abdominal discomfort.  History is very limited due to his current state.      Past Medical History:  Diagnosis Date   Anemia    Arthritis    "left hip" (04/26/2015)   Cellulitis and abscess of leg hositalized 04/25/2015   left   Chronic diastolic CHF (congestive heart failure) ([place]) 06/25/2021   Chronic hip pain    Depression    GERD (gastroesophageal reflux disease)    Hypercholesterolemia    Hypertension    Pneumonia 1960's X 1   Primary skin malignancy with unknown cell type 12/10/2016   Sleep apnea    "couldn't wear the mask" (04/26/2015)   Thalassemia minor    Type II diabetes mellitus [facility])     Patient Active Problem List   Diagnosis Date Noted   Acute on chronic respiratory failure with hypoxia and hypercapnia ([place]) 06/25/2021   CAP (community acquired pneumonia) 06/25/2021   Chronic diastolic CHF (congestive heart failure) ([place]) 06/25/2021   Benign prostatic hyperplasia with urinary obstruction 08/29/2020   Overactive bladder 08/29/2020   Type II diabetes mellitus, uncontrolled 08/29/2020   Impotence 08/29/2020   Urge incontinence of urine 08/29/2020   Chronic respiratory failure with hypoxia ([place]) 04/06/2019   Dyslipidemia 04/06/2019   Hypomagnesemia 04/06/2019   MDD (major depressive disorder) 04/06/2019   Psychosis ([place]) 04/05/2019   Dyspnea 02/12/2019   Pain in left wrist 51/10/5850   Diastolic dysfunction 77/82/4235   Primary skin malignancy with unknown cell type  36/14/4315   Acute diastolic heart failure ([place]) 08/25/2016   Hypoxia 08/21/2016   Acute respiratory failure with hypoxia and hypercapnia ([facility]) 08/21/2016   Moderate protein-calorie malnutrition ([place]) 08/21/2016   Stasis edema of both lower extremities 08/21/2016   Chronic pain 08/21/2016   Tobacco use disorder 02/23/2016   Venous stasis syndrome 02/09/2016   Spondylosis without myelopathy or radiculopathy, lumbar region 12/13/2015   AKI (acute kidney injury) ([place]) 11/02/2015   Hyperkalemia 11/02/2015   Hypoglycemia 11/02/2015   Cellulitis    Leg ulcer ([place])    Nonhealing ulcer of left lower extremity ([place])    Pain of left leg    Cellulitis of leg 04/25/2015   Degenerative arthritis of hip 04/25/2015   Cellulitis and abscess of leg    Microcytic anemia 03/27/2015   Hypertension    Fever    DM2 (diabetes mellitus, type 2) ([place]) 03/20/2015   MSSA (methicillin susceptible Staphylococcus aureus) infection 03/20/2015   Leukocytosis 03/20/2015   Anemia, iron deficiency 03/20/2015   Cellulitis of left lower extremity    Pyomyositis    Systemic infection ([place]) 03/14/2015    Past Surgical History:  Procedure Laterality Date   INGUINAL HERNIA REPAIR Right ~ 2004   SHOULDER ARTHROSCOPY W/ ROTATOR CUFF REPAIR Right ~ 2004   TOENAIL EXCISION Right 1980's X 2   "big toe"       Family History  Problem Relation Age of Onset   Diabetes Mellitus II Mother  Diabetes Mellitus II Father    Diabetes Mellitus II Brother     Social History   Tobacco Use   Smoking status: Every Day    Packs/day: 0.12    Years: 45.00    Pack years: 5.40    Types: Cigarettes   Smokeless tobacco: Never  Vaping Use   Vaping Use: Never used  Substance Use Topics   Alcohol use: No    Comment: 04/26/2015 "I may drink 1/2 glass of wine or a couple beers 1-2 times/month; if that"   Drug use: No    Comment: "stopped all drug use in 2005; S/P SARP program in [place]"    Home Medications Prior to  Admission medications   Medication Sig Start Date End Date Taking? Authorizing Provider  acetaminophen (TYLENOL) 325 MG tablet Take 2 tablets (650 mg total) by mouth every 6 (six) hours as needed for mild pain (or Fever >/= 101). 03/23/15   Robbie Lis, MD  ARIPiprazole (ABILIFY) 2 MG tablet Take 2 mg by mouth at bedtime. 04/27/21   [provider]  aspirin EC 81 MG tablet Take 81 mg by mouth every morning.    [provider]  benzocaine (ORAJEL) 10 % mucosal gel Use as directed 1 application in the mouth or throat every 2 (two) hours as needed for mouth pain (soreness).    [provider]  betamethasone dipropionate 0.05 % lotion Apply 1 application topically every 12 (twelve) hours as needed (itching). 06/07/21   [provider]  chlorhexidine (PERIDEX) 0.12 % solution Use as directed 5 mLs in the mouth or throat 2 (two) times daily.    [provider]  Cholecalciferol (VITAMIN D) 50 MCG (2000 UT) tablet Take 2,000 Units by mouth daily.    [provider]  ferrous sulfate 325 (65 FE) MG tablet Take 325 mg by mouth daily.    [provider]  furosemide (LASIX) 20 MG tablet Take 60 mg by mouth 2 (two) times daily. 05/16/21   [provider]  gabapentin (NEURONTIN) 300 MG capsule Take 300 mg by mouth 3 (three) times daily. 05/16/21   [provider]  Glucagon HCl (GLUCAGON EMERGENCY) 1 MG/ML SOLR Inject 1 mg as directed every 6 (six) hours as needed (low CBG).    [provider]  hydrocortisone (ANUSOL-HC) 2.5 % rectal cream Apply 1 application topically every 12 (twelve) hours as needed (rash). 02/10/21   [provider]  hydrocortisone (ANUSOL-HC) 2.5 % rectal cream Place 1 application rectally daily as needed for hemorrhoids or anal itching.    [provider]  insulin aspart (NOVOLOG) 100 UNIT/ML injection Before each meal 3 times a day, 140-199 - 2 units, 200-250 - 4 units, 251-299 - 6 units,   300-349 - 8 units,  350 or above 10 units. Dispense syringes and needles as needed, Ok to switch to PEN if approved. Substitute to any brand approved. DX DM2, Code E11.65 Patient taking differently: Inject 14 Units into the skin as directed. Inject 14 units TID & Sliding Scale QID: If 150-200=2 units, 201-250=5 units, 251-300=10 units, 301-350=15 units If > 350=20 units 11/13/15   Thurnell Lose, MD  Insulin Glargine (BASAGLAR KWIKPEN) 100 UNIT/ML Inject 30 Units into the skin 2 (two) times daily. 06/11/21   [provider]  lisinopril (PRINIVIL,ZESTRIL) 2.5 MG tablet Take 1 tablet (2.5 mg total) by mouth daily. 11/13/15   Thurnell Lose, MD  melatonin 3 MG TABS tablet Take 3 mg by mouth  at bedtime.    [provider]  METAMUCIL FIBER PO Take 2 Wafers by mouth in the morning and at bedtime.    [provider]  metFORMIN (GLUCOPHAGE) 500 MG tablet Take 500 mg by mouth 2 (two) times daily with a meal. 10/15/17   [provider]  Multiple Vitamin (MULTIVITAMIN WITH MINERALS) TABS tablet Take 1 tablet by mouth daily. 03/23/15   Robbie Lis, MD  nitroGLYCERIN (NITROSTAT) 0.4 MG SL tablet Place 0.4 mg under the tongue every 5 (five) minutes as needed for chest pain.    [provider]  tamsulosin (FLOMAX) 0.4 MG CAPS capsule Take 0.8 mg by mouth at bedtime.    [provider]  tiZANidine (ZANAFLEX) 4 MG tablet Take 4 mg by mouth every 8 (eight) hours as needed for muscle spasms.    [provider]  TRADJENTA 5 MG TABS tablet Take 5 mg by mouth daily. 05/04/21   [provider]  traZODone (DESYREL) 50 MG tablet Take 25 mg by mouth at bedtime as needed. 06/08/21   [provider]  triamcinolone cream (KENALOG) 0.1 % Apply 1 application topically 2 (two) times daily. 06/18/21   [provider]  VIIBRYD 40 MG TABS Take 40 mg by mouth daily. 05/16/21   [provider]    Allergies    Other and Sulfa  antibiotics  Review of Systems   Review of Systems  Unable to perform ROS: Acuity of condition   Physical Exam Updated Vital Signs There were no vitals taken for this visit.  Physical Exam Vitals and nursing note reviewed.  Constitutional:      General: He is not in acute distress.    Appearance: Normal appearance. He is well-developed. He is not toxic-appearing.  HENT:     Head: Normocephalic and atraumatic.  Eyes:     General: Lids are normal.     Conjunctiva/sclera: Conjunctivae normal.     Pupils: Pupils are equal, round, and reactive to light.  Neck:     Thyroid: No thyroid mass.     Trachea: No tracheal deviation.  Cardiovascular:     Rate and Rhythm: Normal rate and regular rhythm.     Heart sounds: Normal heart sounds. No murmur [place].   No gallop.  Pulmonary:     Effort: Pulmonary effort is normal. No respiratory distress.     Breath sounds: Normal breath sounds. No stridor. No decreased breath sounds, wheezing, rhonchi or rales.  Abdominal:     General: There is no distension.     Palpations: Abdomen is soft.     Tenderness: There is no abdominal tenderness. There is no rebound.  Musculoskeletal:        General: No tenderness. Normal range of motion.     Cervical back: Normal range of motion and neck supple.     Comments: Bilateral lower extremities wrapped with gauze  Skin:    General: Skin is warm and dry.     Findings: No abrasion or rash.  Neurological:     Mental Status: He is lethargic, disoriented and confused.     GCS: GCS eye subscore is 3. GCS verbal subscore is 4. GCS motor subscore is 5.     Cranial Nerves: No cranial nerve deficit.     Sensory: No sensory deficit.     Motor: Tremor present.  Psychiatric:        Attention and Perception: He is inattentive.    ED Results / Procedures / Treatments  Labs (all labs ordered are listed, but only abnormal results are displayed) Labs Reviewed  RESP PANEL BY RT-PCR (FLU A&B, COVID) [id number]   CULTURE, BLOOD (ROUTINE X 2)  CULTURE, BLOOD (ROUTINE X 2)  URINE CULTURE  LACTIC ACID, PLASMA  LACTIC ACID, PLASMA  COMPREHENSIVE METABOLIC PANEL  CBC WITH DIFFERENTIAL/PLATELET  PROTIME-INR  APTT  URINALYSIS, ROUTINE W REFLEX MICROSCOPIC  CBG MONITORING, ED    EKG None  Radiology No results found.  Procedures Procedures   Medications Ordered in ED Medications  lactated ringers infusion (has no administration in time range)  lactated ringers bolus 1,000 mL (has no administration in time range)    And  lactated ringers bolus 1,000 mL (has no administration in time range)    And  lactated ringers bolus 1,000 mL (has no administration in time range)    And  lactated ringers bolus 1,000 mL (has no administration in time range)  ceFEPIme (MAXIPIME) 2 g in sodium chloride 0.9 % 100 mL IVPB (has no administration in time range)  metroNIDAZOLE (FLAGYL) IVPB 500 mg (has no administration in time range)  vancomycin (VANCOCIN) IVPB 1000 mg/200 mL premix (has no administration in time range)  acetaminophen (TYLENOL) suppository 650 mg (has no administration in time range)    ED Course  I have reviewed the triage vital signs and the nursing notes.  Pertinent labs & imaging results that were available during my care of the patient were reviewed by me and considered in my medical decision making (see chart for details).    MDM Rules/Calculators/A&P                           Patient febrile here and treated with Tylenol patient is lower extremity dressing taken down and has evidence purulent drainage noted from his left lower extremity consistent with cellulitis.  He had previously been started on IV antibiotics.  Lactate was normal but he does have a leukocytosis of 14.3 thousand.  Urinalysis and chest x-ray without significant findings.  Source of sepsis likely from his lower extremity wound.  Blood pressure stable here.  Will require hospital admission CRITICAL CARE Performed  by: Leota Jacobsen Total critical care time: 55 minutes Critical care time was exclusive of separately billable procedures and treating other patients. Critical care was necessary to treat or prevent imminent or life-threatening deterioration. Critical care was time spent personally by me on the following activities: development of treatment plan with patient and/or surrogate as well as nursing, discussions with consultants, evaluation of patient's response to treatment, examination of patient, obtaining history from patient or surrogate, ordering and performing treatments and interventions, ordering and review of laboratory studies, ordering and review of radiographic studies, pulse oximetry and re-evaluation of patient's condition.  Final Clinical Impression(s) / ED Diagnoses Final diagnoses:  None    Rx / DC Orders ED Discharge Orders     None        Lacretia Leigh, MD 09/03/21 1619    Lacretia Leigh, MD 09/03/21 [phone]

## 2021-09-04 ENCOUNTER — Inpatient Hospital Stay (HOSPITAL_COMMUNITY): Payer: No Typology Code available for payment source

## 2021-09-04 ENCOUNTER — Encounter (HOSPITAL_COMMUNITY): Payer: Self-pay | Admitting: Pulmonary Disease

## 2021-09-04 DIAGNOSIS — I5032 Chronic diastolic (congestive) heart failure: Secondary | ICD-10-CM

## 2021-09-04 DIAGNOSIS — A419 Sepsis, unspecified organism: Secondary | ICD-10-CM | POA: Diagnosis present

## 2021-09-04 LAB — GLUCOSE, CAPILLARY
Glucose-Capillary: 154 mg/dL — ABNORMAL HIGH (ref 70–99)
Glucose-Capillary: 132 mg/dL — ABNORMAL HIGH (ref 70–99)
Glucose-Capillary: 101 mg/dL — ABNORMAL HIGH (ref 70–99)
Glucose-Capillary: 134 mg/dL — ABNORMAL HIGH (ref 70–99)
Glucose-Capillary: 194 mg/dL — ABNORMAL HIGH (ref 70–99)

## 2021-09-04 LAB — BLOOD GAS, ARTERIAL
Acid-Base Excess: 7 mmol/L — ABNORMAL HIGH (ref 0.0–2.0)
Acid-Base Excess: 6.5 mmol/L — ABNORMAL HIGH (ref 0.0–2.0)
Acid-Base Excess: 4.6 mmol/L — ABNORMAL HIGH (ref 0.0–2.0)
Allens test (pass/fail): POSITIVE — AB
Bicarbonate: 29.1 mmol/L — ABNORMAL HIGH (ref 20.0–28.0)
Bicarbonate: 29.7 mmol/L — ABNORMAL HIGH (ref 20.0–28.0)
Bicarbonate: 30 mmol/L — ABNORMAL HIGH (ref 20.0–28.0)
Drawn by: 560031
Drawn by: 25770
FIO2: 60
FIO2: 60
FIO2: 50
MECHVT: 710 mL
MECHVT: 620 mL
MECHVT: 620 mL
O2 Saturation: 94.4 %
O2 Saturation: 96.8 %
O2 Saturation: 94.2 %
PEEP: 5 cmH2O
PEEP: 8 cmH2O
PEEP: 8 cmH2O
Patient temperature: 98.6
Patient temperature: 98.6
Patient temperature: 98.6
RATE: 18 resp/min
RATE: 14 resp/min
pCO2 arterial: 32.3 mmHg (ref 32.0–48.0)
pCO2 arterial: 37.9 mmHg (ref 32.0–48.0)
pCO2 arterial: 50.7 mmHg — ABNORMAL HIGH (ref 32.0–48.0)
pH, Arterial: 7.563 — ABNORMAL HIGH (ref 7.350–7.450)
pH, Arterial: 7.505 — ABNORMAL HIGH (ref 7.350–7.450)
pH, Arterial: 7.39 (ref 7.350–7.450)
pO2, Arterial: 64.4 mmHg — ABNORMAL LOW (ref 83.0–108.0)
pO2, Arterial: 83.6 mmHg (ref 83.0–108.0)
pO2, Arterial: 77 mmHg — ABNORMAL LOW (ref 83.0–108.0)

## 2021-09-04 LAB — PHOSPHORUS: Phosphorus: 3.8 mg/dL (ref 2.5–4.6)

## 2021-09-04 LAB — URINE CULTURE

## 2021-09-04 LAB — BLOOD CULTURE ID PANEL (REFLEXED) - BCID2

## 2021-09-04 LAB — HEMOGLOBIN A1C
Hgb A1c MFr Bld: 10.2 % — ABNORMAL HIGH (ref 4.8–5.6)
Mean Plasma Glucose: 246.04 mg/dL

## 2021-09-04 LAB — ECHOCARDIOGRAM COMPLETE
AR max vel: 2.23 cm2
AV Area VTI: 2.54 cm2
AV Area mean vel: 2.14 cm2
AV Mean grad: 4 mmHg
AV Peak grad: 7.4 mmHg
Ao pk vel: 1.36 m/s
Area-P 1/2: 3.48 cm2
Height: 77 in
S' Lateral: 3.3 cm
Single Plane A4C EF: 53.5 %
Weight: 4232.83 oz

## 2021-09-04 LAB — CBG MONITORING, ED
Glucose-Capillary: 193 mg/dL — ABNORMAL HIGH (ref 70–99)
Glucose-Capillary: 268 mg/dL — ABNORMAL HIGH (ref 70–99)

## 2021-09-04 LAB — MRSA NEXT GEN BY PCR, NASAL: MRSA by PCR Next Gen: DETECTED — AB

## 2021-09-04 LAB — MAGNESIUM: Magnesium: 1.9 mg/dL (ref 1.7–2.4)

## 2021-09-04 MED ORDER — PERFLUTREN LIPID MICROSPHERE
1.0000 mL | INTRAVENOUS | Status: AC | PRN
Start: 2021-09-04 — End: 2021-09-04
  Administered 2021-09-04: 2 mL via INTRAVENOUS
  Filled 2021-09-04: qty 10

## 2021-09-04 MED ORDER — VITAL HIGH PROTEIN PO LIQD: 1000.0000 mL | ORAL | Status: DC

## 2021-09-04 MED ORDER — FENTANYL CITRATE (PF) 100 MCG/2ML IJ SOLN
25.0000 ug | INTRAMUSCULAR | Status: DC | PRN
  Administered 2021-09-04 – 2021-09-06 (×6): 100 ug via INTRAVENOUS
  Filled 2021-09-04 (×6): qty 2

## 2021-09-04 MED ORDER — SODIUM CHLORIDE 0.9 % IV SOLN
250.0000 mL | INTRAVENOUS | Status: DC
  Administered 2021-09-04 – 2021-09-08 (×2): 250 mL via INTRAVENOUS

## 2021-09-04 MED ORDER — PANTOPRAZOLE SODIUM 40 MG IV SOLR
40.0000 mg | Freq: Every day | INTRAVENOUS | Status: DC
  Administered 2021-09-04: 40 mg via INTRAVENOUS
  Filled 2021-09-04: qty 40

## 2021-09-04 MED ORDER — PROSOURCE TF PO LIQD
90.0000 mL | Freq: Two times a day (BID) | ORAL | Status: DC
  Administered 2021-09-04 – 2021-09-06 (×4): 90 mL
  Filled 2021-09-04 (×4): qty 90

## 2021-09-04 MED ORDER — SODIUM CHLORIDE 0.9 % IV SOLN
2.0000 g | INTRAVENOUS | Status: DC
  Filled 2021-09-04: qty 20

## 2021-09-04 MED ORDER — FENTANYL CITRATE (PF) 100 MCG/2ML IJ SOLN: 25.0000 ug | INTRAMUSCULAR | Status: DC | PRN

## 2021-09-04 MED ORDER — CHLORHEXIDINE GLUCONATE 0.12% ORAL RINSE (MEDLINE KIT)
15.0000 mL | Freq: Two times a day (BID) | OROMUCOSAL | Status: DC
  Administered 2021-09-04 – 2021-09-07 (×7): 15 mL via OROMUCOSAL

## 2021-09-04 MED ORDER — VITAL HIGH PROTEIN PO LIQD
1000.0000 mL | ORAL | Status: DC
  Administered 2021-09-04 – 2021-09-06 (×3): 1000 mL

## 2021-09-04 MED ORDER — ORAL CARE MOUTH RINSE
15.0000 mL | OROMUCOSAL | Status: DC
  Administered 2021-09-04 – 2021-09-07 (×33): 15 mL via OROMUCOSAL

## 2021-09-04 MED ORDER — PROSOURCE TF PO LIQD: 45.0000 mL | Freq: Two times a day (BID) | ORAL | Status: DC

## 2021-09-04 MED ORDER — SODIUM CHLORIDE 0.9 % IV SOLN
2.0000 g | Freq: Three times a day (TID) | INTRAVENOUS | Status: DC
  Administered 2021-09-04 – 2021-09-05 (×3): 2 g via INTRAVENOUS
  Filled 2021-09-04 (×3): qty 2

## 2021-09-04 MED ORDER — MUPIROCIN 2 % EX OINT
1.0000 "application " | TOPICAL_OINTMENT | Freq: Two times a day (BID) | CUTANEOUS | Status: AC
  Administered 2021-09-04 – 2021-09-08 (×10): 1 via NASAL
  Filled 2021-09-04: qty 22

## 2021-09-04 MED ORDER — CHLORHEXIDINE GLUCONATE CLOTH 2 % EX PADS
6.0000 | MEDICATED_PAD | Freq: Every day | CUTANEOUS | Status: DC
  Administered 2021-09-04 – 2021-09-07 (×4): 6 via TOPICAL

## 2021-09-04 NOTE — Progress Notes (Signed)
eLink Physician-Brief Progress Note Patient Name: [name] DOB: 01-12-1956 MRN: [id number]   Date of Service  09/04/2021  HPI/Events of Note  Patient admitted for bilateral lower extremity cellulitis, then intubated in the ED secondary to acute hypercapnic respiratory failure.   eICU Interventions  New Patient Evaluation.        [name] [name] 09/04/2021, 7:07 AM

## 2021-09-04 NOTE — Progress Notes (Addendum)
NAME:  [name], MRN:  [id number], DOB:  November 01, 1955, LOS: 1 ADMISSION DATE:  09/03/2021, CONSULTATION DATE:  12/12 REFERRING MD:  Dr. Sarajane Jews TRH, CHIEF COMPLAINT:  AMS   History of Present Illness:  Patient is encephalopathic and/or intubated. Therefore history has been obtained from chart review.   65 year old male with PMH as below, which is significant for HFpEF, HTN, OSA, DM, chronic hypoxemic respiratory failure on 2L, and previous cellulitis of the lower extremity.   He resides in SNF. In the AM hours of 12/12 he developed rigors and SNF staff noted his temperature to be 104F. Also noted bilatera lower extremity wounds with malodorous discharge.  He was last known well in the early AM hours when he was seen outside smoking by staff. At baseline he is alert and oriented x4. Upon arrival to the ED he was febrile. Lower extremity dressings were taken down and purulent drainage was noted.   He was set to be admitted to the hospitalist service for treatment of sepsis secondary to bilateral lower extremity cellulitis, however, he subsequently became somnolent and eventually unarousable. ABG showed severe respiratory acidosis. The patient was intubated by the ED physician and PCCM was asked to transfer the patient to ICU.   Febrile in ED  Initially tachy 120 7.183/93/85.8 Prior CO2 on ABG 52-60s compensated 4L LR ?, 158ml/hr  Flagyl, cefepime, vanc  Pertinent  Medical History   has a past medical history of Anemia, Arthritis, Cellulitis and abscess of leg (hositalized 04/25/2015), Chronic diastolic CHF (congestive heart failure) ([place]) (06/25/2021), Chronic hip pain, Depression, GERD (gastroesophageal reflux disease), Hypercholesterolemia, Hypertension, Pneumonia (1960's X 1), Primary skin malignancy with unknown cell type (12/10/2016), Sleep apnea, Thalassemia minor, and Type II diabetes mellitus ([place]).  On 2L at home, unclear why.  No signs of emphysema on CT   CXR - R sided "effusion"  actually looks like R hemidiaphragm elevation on prior CT similar scout film findings, no effusion  Significant Hospital Events: Including procedures, antibiotic start and stop dates in addition to other pertinent events   12/12 admit on vent  Interim History / Subjective:  No acute events overnight.   Objective   Blood pressure (!) 108/45, pulse 74, temperature (!) 100.8 F (38.2 C), temperature source Bladder, resp. rate 15, height 6\' 5"  (1.956 m), weight 120 kg, SpO2 100 %.    Vent Mode: PRVC FiO2 (%):  [100 %] 100 % Set Rate:  [22 bmp] 22 bmp Vt Set:  [710 mL] 710 mL PEEP:  [5 cmH20] 5 cmH20 Plateau Pressure:  [24 cmH20-29 cmH20] 24 cmH20   Intake/Output Summary (Last 24 hours) at 09/04/2021 0829 Last data filed at 09/04/2021 7026 Gross per 24 hour  Intake 572.41 ml  Output --  Net 572.41 ml   Filed Weights   09/03/21 1837  Weight: 120 kg    Examination:  General: obese adult male in NAD on vent.  HENT: [place]/AT, PERRL, no JVD Lungs: Clear bilateral vent assisted breaths.  Cardiovascular: rRRR, no MRG Abdomen: Soft, non-distended. Hypoactive.  Extremities: Lower extremity dressings CDI.  Neuro: sedated     CT reviewed > collapse of right lower lobe with air bronchograms seen distally c/w pneumonia.   Resolved Hospital Problem list     Assessment & Plan:   Sepsis, shock: received 4L fluids. Strep bacteremia noted on BCID. Source lower extremity cellulitis vs PNA - off pressors overnight.  - Continued gentle MIVF - Antibiotics as below - Echo pending  Strep bacteremia:  identified on BCID HCAP: SNF resident.  Bilateral lower extremity cellulitis.  - Ceftriaxone  - Await full BC report - Tracheal aspirate, urine cultures pending.   Acute on chronic mixed respiratory failure: Large A-a gradient presently. On 2L O2 at home, unclear why. RLL collapse with dense consolidation on CT.  PE considered, but could not give contrast due to renal injury. OSA related  PAH?Echo pending. - full vent support - check ABG - VAP bundle - Echo pending - Consider V/Q   Acute metabolic encephalopathy. Likely 2/2 sepsis.  Need for sedation for mechanical ventilation - RASS goal -1 to -2.  - Propofol infusion and PRN fentanyl for RASS goal - Defer WUA today. Daily WUA starting 12/14  HFpEF - echo pending - holding home lisinopril, furosemide,   OSA in history, but cannot determine if he is prescribed NIV - supportive care - Learn more hopefully once extubated  Dysphagia: mechanical soft diet at baseline - NPO for now - Will start TF per protocol.   DM - CBG monitoring and SSI - Holding home metformin, linagliptin  Best Practice (right click and "Reselect all SmartList Selections" daily)   Diet/type: NPO DVT prophylaxis: prophylactic heparin  GI prophylaxis: PPI Lines: N/A Foley:  N/A Code Status:  full code Last date of multidisciplinary goals of care discussion []   Labs   CBC: Recent Labs  Lab 09/03/21 1321 09/03/21 2158  WBC 14.3* 17.4*  NEUTROABS 13.2*  --   HGB 13.1 11.4*  HCT 46.5 41.2  MCV 72.0* 72.5*  PLT 238 212     Basic Metabolic Panel: Recent Labs  Lab 09/03/21 1321 09/03/21 2158  NA 138  --   K 5.2*  --   CL 97*  --   CO2 33*  --   GLUCOSE 201*  --   BUN 32*  --   CREATININE 1.17 1.30*  CALCIUM 9.0  --     GFR: Estimated Creatinine Clearance: 79.2 mL/min (A) (by C-G formula based on SCr of 1.3 mg/dL (H)). Recent Labs  Lab 09/03/21 1321 09/03/21 1601 09/03/21 1836 09/03/21 2158  WBC 14.3*  --   --  17.4*  LATICACIDVEN 1.7 2.2* 1.7  --      Liver Function Tests: Recent Labs  Lab 09/03/21 1321  AST 26  ALT 26  ALKPHOS 107  BILITOT 0.3  PROT 8.2*  ALBUMIN 3.5    No results for input(s): LIPASE, AMYLASE in the last 168 hours. No results for input(s): AMMONIA in the last 168 hours.  ABG    Component Value Date/Time   PHART 7.495 (H) 09/03/2021 2312   PCO2ART 39.5 09/03/2021 2312    PO2ART 59.7 (L) 09/03/2021 2312   HCO3 29.9 (H) 09/03/2021 2312   TCO2 21 11/02/2015 2039   ACIDBASEDEF 6.0 (H) 11/02/2015 2039   O2SAT 92.4 09/03/2021 2312      Coagulation Profile: Recent Labs  Lab 09/03/21 1321  INR 1.0     Cardiac Enzymes: No results for input(s): CKTOTAL, CKMB, CKMBINDEX, TROPONINI in the last 168 hours.  HbA1C: Hgb A1c MFr Bld  Date/Time Value Ref Range Status  09/03/2021 09:58 PM 10.2 (H) 4.8 - 5.6 % Final    Comment:    (NOTE) Pre diabetes:          5.7%-6.4%  Diabetes:              >6.4%  Glycemic control for   <7.0% adults with diabetes   06/25/2021 01:10 AM 8.4 (H) 4.8 -  5.6 % Final    Comment:    (NOTE) Pre diabetes:          5.7%-6.4%  Diabetes:              >6.4%  Glycemic control for   <7.0% adults with diabetes     CBG: Recent Labs  Lab 09/03/21 1458 09/04/21 0024 09/04/21 0411 09/04/21 0803  GLUCAP 185* 268* 193* 154*     Review of Systems:   Patient is encephalopathic and/or intubated. Therefore history has been obtained from chart review.    Past Medical History:  He,  has a past medical history of Anemia, Arthritis, Cellulitis and abscess of leg (hositalized 04/25/2015), Chronic diastolic CHF (congestive heart failure) ([place]) (06/25/2021), Chronic hip pain, Depression, GERD (gastroesophageal reflux disease), Hypercholesterolemia, Hypertension, Pneumonia (1960's X 1), Primary skin malignancy with unknown cell type (12/10/2016), Sleep apnea, Thalassemia minor, and Type II diabetes mellitus ([place]).   Surgical History:   Past Surgical History:  Procedure Laterality Date   INGUINAL HERNIA REPAIR Right ~ 2004   SHOULDER ARTHROSCOPY W/ ROTATOR CUFF REPAIR Right ~ 2004   TOENAIL EXCISION Right 1980's X 2   "big toe"     Social History:   reports that he has been smoking cigarettes. He has a 5.40 pack-year smoking history. He has never used smokeless tobacco. He reports that he does not drink alcohol and does not use  drugs.   Family History:  His family history includes Diabetes Mellitus II in his brother, father, and mother.   Allergies Allergies  Allergen Reactions   Other Other (See Comments)    Pt is a Jehovah Witness. No blood products.   Sulfa Antibiotics Other (See Comments)    Causes flu-like symptom : sweating, chills, fever, body aches     Home Medications  Prior to Admission medications   Medication Sig Start Date End Date Taking? Authorizing Provider  acetaminophen (TYLENOL) 325 MG tablet Take 2 tablets (650 mg total) by mouth every 6 (six) hours as needed for mild pain (or Fever >/= 101). 03/23/15   Robbie Lis, MD  ARIPiprazole (ABILIFY) 2 MG tablet Take 2 mg by mouth at bedtime. 04/27/21   [provider]  aspirin EC 81 MG tablet Take 81 mg by mouth every morning.    [provider]  benzocaine (ORAJEL) 10 % mucosal gel Use as directed 1 application in the mouth or throat every 2 (two) hours as needed for mouth pain (soreness).    [provider]  betamethasone dipropionate 0.05 % lotion Apply 1 application topically every 12 (twelve) hours as needed (itching). 06/07/21   [provider]  chlorhexidine (PERIDEX) 0.12 % solution Use as directed 5 mLs in the mouth or throat 2 (two) times daily.    [provider]  Cholecalciferol (VITAMIN D) 50 MCG (2000 UT) tablet Take 2,000 Units by mouth daily.    [provider]  ferrous sulfate 325 (65 FE) MG tablet Take 325 mg by mouth daily.    [provider]  furosemide (LASIX) 20 MG tablet Take 60 mg by mouth 2 (two) times daily. 05/16/21   [provider]  gabapentin (NEURONTIN) 300 MG capsule Take 300 mg by mouth 3 (three) times daily. 05/16/21   [provider]  Glucagon HCl (GLUCAGON EMERGENCY) 1 MG/ML SOLR Inject 1 mg as directed every 6 (six) hours as needed (low CBG).    [provider]  hydrocortisone (ANUSOL-HC) 2.5 % rectal cream Apply 1  application  topically every 12 (twelve) hours as needed (rash). 02/10/21   [provider]  hydrocortisone (ANUSOL-HC) 2.5 % rectal cream Place 1 application rectally daily as needed for hemorrhoids or anal itching.    [provider]  insulin aspart (NOVOLOG) 100 UNIT/ML injection Before each meal 3 times a day, 140-199 - 2 units, 200-250 - 4 units, 251-299 - 6 units,  300-349 - 8 units,  350 or above 10 units. Dispense syringes and needles as needed, Ok to switch to PEN if approved. Substitute to any brand approved. DX DM2, Code E11.65 Patient taking differently: Inject 14 Units into the skin as directed. Inject 14 units TID & Sliding Scale QID: If 150-200=2 units, 201-250=5 units, 251-300=10 units, 301-350=15 units If > 350=20 units 11/13/15   Thurnell Lose, MD  Insulin Glargine (BASAGLAR KWIKPEN) 100 UNIT/ML Inject 30 Units into the skin 2 (two) times daily. 06/11/21   [provider]  lisinopril (PRINIVIL,ZESTRIL) 2.5 MG tablet Take 1 tablet (2.5 mg total) by mouth daily. 11/13/15   Thurnell Lose, MD  melatonin 3 MG TABS tablet Take 3 mg by mouth at bedtime.    [provider]  METAMUCIL FIBER PO Take 2 Wafers by mouth in the morning and at bedtime.    [provider]  metFORMIN (GLUCOPHAGE) 500 MG tablet Take 500 mg by mouth 2 (two) times daily with a meal. 10/15/17   [provider]  Multiple Vitamin (MULTIVITAMIN WITH MINERALS) TABS tablet Take 1 tablet by mouth daily. 03/23/15   Robbie Lis, MD  nitroGLYCERIN (NITROSTAT) 0.4 MG SL tablet Place 0.4 mg under the tongue every 5 (five) minutes as needed for chest pain.    [provider]  tamsulosin (FLOMAX) 0.4 MG CAPS capsule Take 0.8 mg by mouth at bedtime.    [provider]  tiZANidine (ZANAFLEX) 4 MG tablet Take 4 mg by mouth every 8 (eight) hours as needed for muscle spasms.    [provider]  TRADJENTA 5 MG TABS tablet Take 5 mg by mouth daily. 05/04/21   [provider]  traZODone (DESYREL) 50 MG tablet Take 25 mg by mouth at bedtime as needed. 06/08/21   [provider]  triamcinolone cream (KENALOG) 0.1 % Apply 1 application topically 2 (two) times daily. 06/18/21   [provider]  VIIBRYD 40 MG TABS Take 40 mg by mouth daily. 05/16/21   [provider]     Critical care time: 42 minutes     Georgann Housekeeper, AGACNP-BC [facility]  See Amion for personal pager PCCM on call pager [phone] until 7pm. Please call Elink 7p-7a. [phone]  09/04/2021 8:55 AM   PCCM Attending:   This is a 65 year old gentleman, past medical history of heart failure with preserved ejection fraction, hypertension, OSA, diabetes, chronic hypoxemic respiratory failure on 2 L with lower extremity cellulitis.  He developed rigors at his SNF.  He was out in the morning smoking a cigarette.  Came back inside and started to have clinical decline.  Also has bilateral lower extremity wounds with drainage.  Patient was brought to the emergency department for evaluation.  Underwent CT imaging of the chest with also found to have a lower lobe infiltrate with adenopathy in the chest.  Blood cultures returned positive with strep species.  Patient was intubated by critical care overnight.  BP (!) 146/80   Pulse 74   Temp 99.3 F (37.4 C)   Resp (!)  22   Ht 6\' 5"  (1.956 m)   Wt 120 kg   SpO2 97%   BMI 31.37 kg/m   Elderly gentleman intubated on mechanical life support critically ill HEENT: NCAT, endotracheal tube in place Heart: Regular rate rhythm S1-S2 Lungs: Bilateral mechanically ventilated breath sounds Abdomen: Soft nontender nondistended  Labs: Reviewed Blood cultures positive strep species Leukocytosis  CT chest: Right lower lobe infiltrate with consolidation, mediastinal adenopathy no clear central mass identified  Assessment: Septic shock, strep bacteremia, cultures pending, positive based on BC  ID Bilateral lower extremity cellulitis Healthcare associated pneumonia, patient lives in SNF Acute hypoxemic respiratory failure requiring intubation and mechanical ventilation Multiple organ failure, vasopressors Acute metabolic encephalopathy secondary to above Heart failure with preserved ejection fraction Obstructive sleep apnea Type 2 diabetes with hyperglycemia secondary to above  Plan: Remains on full vent support Continue to wean vasopressors to maintain mean arterial pressure greater than 65 mmHg Continue cefepime Echo pending Wound care consult for lower extremity cellulitis and open wound management Holding home lisinopril and diuretics  This patient is critically ill with multiple organ system failure; which, requires frequent high complexity decision making, assessment, support, evaluation, and titration of therapies. This was completed through the application of advanced monitoring technologies and extensive interpretation of multiple databases. During this encounter critical care time was devoted to patient care services described in this note for 50 minutes.  Garner Nash, DO [facility] 09/04/2021 9:53 AM     ,

## 2021-09-04 NOTE — Progress Notes (Signed)
An USGPIV (ultrasound guided PIV) has been placed for short-term vasopressor infusion. A correctly placed ivWatch must be used when administering Vasopressors. Should this treatment be needed beyond 72 hours, central line access should be obtained.  It will be the responsibility of the bedside nurse to follow best practice to prevent extravasations.   ?

## 2021-09-04 NOTE — Consult Note (Signed)
[place] Nurse Consult Note: Patient receiving care in [facility] ICU 1224. Primary RN assisting with BLE wound care. Reason for Consult: BLE wounds, chronic in nature Wound type: previously listed as venous insufficiency. I question if there is also an arterial component to these. Pressure Injury POA: Yes/No/NA Measurement: RLE medial malleolus wound measures 7.2 cm x 3 cm x 0.4 cm; wound bed is pink, not much drainage seen today, no odor. LLE wound measures 18 cm x 13 cm x 0.5 cm; wound bed is pink, not much drainage, no odor detected. Wound bed: Drainage (amount, consistency, odor)  Periwound: Skin that resembles tree bark to BLE and feet. Dressing procedure/placement/frequency: Care performed today and ordered for repeat on daily basis beginning tomorrow: AFTER washing BLE with soap and water, patting dry, apply heavy amount of Criticaid Clear (purple and white tube in clean utility) to BLE, but NOT between the toes.  Place one folded Aquacel Advantage over the right medial ankle wound, and 4 over the LLE wound. Secure with kerlix.  Monitor the wound area(s) for worsening of condition such as: Signs/symptoms of infection,  Increase in size,  Development of or worsening of odor, Development of pain, or increased pain at the affected locations.  Notify the medical team if any of these develop.  Thank you for the consult.  Discussed plan of care with the bedside nurse.  [place] nurse will not follow at this time.  Please re-consult the [place] team if needed.  Val Riles, RN, MSN, CWOCN, CNS-BC, pager [phone]

## 2021-09-04 NOTE — Progress Notes (Signed)
Initial Nutrition Assessment  DOCUMENTATION CODES:   Obesity unspecified  INTERVENTION:   -Vital HP @ 60 ml/hr via OGT -90 ml Prosource TF BID -Providing 1600 kcals, 166g protein and 1203 ml H2O  Will monitor Propofol infusion  NUTRITION DIAGNOSIS:   Increased nutrient needs related to wound healing as evidenced by estimated needs.  GOAL:   Patient will meet greater than or equal to 90% of their needs  MONITOR:   Labs, Weight trends, I & O's, Vent status, TF tolerance  REASON FOR ASSESSMENT:   Ventilator, Consult Enteral/tube feeding initiation and management  ASSESSMENT:   65 year old male with PMH as below, which is significant for HFpEF, HTN, OSA, DM, chronic hypoxemic respiratory failure on 2L, and previous cellulitis of the lower extremity.  Patient in room, sedated. No family present at bedside. RD has now been consulted to start tube feeding. Will initiate recommendations above.  Patient is currently intubated on ventilator support MV: 10.6 L/min Temp (24hrs), Avg:99.5 F (37.5 C), Min:97.7 F (36.5 C), Max:101.5 F (38.6 C)  Propofol: 36 ml/hr -providing 950 fat kcals  Medications reviewed.  Labs reviewed: CBGs: 132-154  NUTRITION - FOCUSED PHYSICAL EXAM:  Flowsheet Row Most Recent Value  Orbital Region No depletion  Upper Arm Region No depletion  Thoracic and Lumbar Region No depletion  Buccal Region No depletion  Temple Region No depletion  Clavicle Bone Region No depletion  Clavicle and Acromion Bone Region No depletion  Scapular Bone Region No depletion  Dorsal Hand Unable to assess  [mitts]  Patellar Region Unable to assess  [wounds]  Anterior Thigh Region Unable to assess  Posterior Calf Region Unable to assess  Hair Reviewed  Eyes Unable to assess  Mouth Unable to assess  Skin Reviewed  [dry]  Nails Unable to assess       Diet Order:   Diet Order             Diet NPO time specified  Diet effective now                    EDUCATION NEEDS:   Not appropriate for education at this time  Skin:  Skin Assessment: Reviewed RN Assessment -BLE nonhealing wounds -malodorous discharge  Last BM:  PTA  Height:   Ht Readings from Last 1 Encounters:  09/04/21 6\' 5"  (1.956 m)    Weight:   Wt Readings from Last 1 Encounters:  09/03/21 120 kg    BMI:  Body mass index is 31.37 kg/m.  Estimated Nutritional Needs:   Kcal:  2300-2500  Protein:  170-180g  Fluid:  2L/day  Clayton Bibles, MS, RD, LDN Inpatient Clinical Dietitian Contact information available via Amion

## 2021-09-04 NOTE — Progress Notes (Signed)
Notified Lab that ABG being sent for analysis. Collected ordered sputum sample and sent to Lab.

## 2021-09-04 NOTE — Progress Notes (Signed)
RT NOTE:  ABG obtained at 1203 and sent to lab. Lab notified.

## 2021-09-04 NOTE — Progress Notes (Signed)
Patient arrived to the unit, comfort was afforded. Vital signs stable.

## 2021-09-04 NOTE — Progress Notes (Addendum)
PHARMACY - PHYSICIAN COMMUNICATION CRITICAL VALUE ALERT - BLOOD CULTURE IDENTIFICATION (BCID)  [name] is an 65 y.o. male who presented to [facility] on 09/03/2021 with a chief complaint of fever  Assessment:  severe B LE cellulitis Strep sp, 2 of 4 bottles - same set, no resistance.   Name of physician (or Provider) Contacted: Icard via [place]  Current antibiotics: Vancomycin, cefepime, Flagyl  Changes to prescribed antibiotics recommended:  Change to ceftriaxone 2 gm IV q24  Results for orders placed or performed during the hospital encounter of 09/03/21  Blood Culture ID Panel (Reflexed) (Collected: 09/03/2021  2:00 PM)  Result Value Ref Range   Enterococcus faecalis NOT DETECTED NOT DETECTED   Enterococcus Faecium NOT DETECTED NOT DETECTED   Listeria monocytogenes NOT DETECTED NOT DETECTED   Staphylococcus species NOT DETECTED NOT DETECTED   Staphylococcus aureus (BCID) NOT DETECTED NOT DETECTED   Staphylococcus epidermidis NOT DETECTED NOT DETECTED   Staphylococcus lugdunensis NOT DETECTED NOT DETECTED   Streptococcus species DETECTED (A) NOT DETECTED   Streptococcus agalactiae NOT DETECTED NOT DETECTED   Streptococcus pneumoniae NOT DETECTED NOT DETECTED   Streptococcus pyogenes NOT DETECTED NOT DETECTED   A.calcoaceticus-baumannii NOT DETECTED NOT DETECTED   Bacteroides fragilis NOT DETECTED NOT DETECTED   Enterobacterales NOT DETECTED NOT DETECTED   Enterobacter cloacae complex NOT DETECTED NOT DETECTED   Escherichia coli NOT DETECTED NOT DETECTED   Klebsiella aerogenes NOT DETECTED NOT DETECTED   Klebsiella oxytoca NOT DETECTED NOT DETECTED   Klebsiella pneumoniae NOT DETECTED NOT DETECTED   Proteus species NOT DETECTED NOT DETECTED   Salmonella species NOT DETECTED NOT DETECTED   Serratia marcescens NOT DETECTED NOT DETECTED   Haemophilus influenzae NOT DETECTED NOT DETECTED   Neisseria meningitidis NOT DETECTED NOT DETECTED   Pseudomonas aeruginosa NOT DETECTED  NOT DETECTED   Stenotrophomonas maltophilia NOT DETECTED NOT DETECTED   Candida albicans NOT DETECTED NOT DETECTED   Candida auris NOT DETECTED NOT DETECTED   Candida glabrata NOT DETECTED NOT DETECTED   Candida krusei NOT DETECTED NOT DETECTED   Candida parapsilosis NOT DETECTED NOT DETECTED   Candida tropicalis NOT DETECTED NOT DETECTED   Cryptococcus neoformans/gattii NOT DETECTED NOT DETECTED    Eudelia Bunch, Pharm.D 09/04/2021 7:39 AM  Addendum: Ceftriaxone escalated to Cefepime 2 gm IV q8h to cover both Strep bacteremia & HAP  Eudelia Bunch, Pharm.D 09/04/2021 9:29 AM

## 2021-09-04 NOTE — TOC Initial Note (Signed)
Transition of Care [facility]) - Initial/Assessment Note    Patient Details  Name: [name] MRN: [id number] Date of Birth: 1956/08/30  Transition of Care [facility]) CM/SW Contact:    Ross Ludwig, LCSW Phone Number: 09/04/2021, 10:44 AM  Clinical Narrative:                  Patient is a 65 year old male who is alert and oriented x1.  Patient is a LTC resident at [facility].  Patient has been residing there for several months.  Patient has confusion and does not communicate with words much.  CSW completed assessment by reviewing patient's chart.  Patient plans to return to SNF once patient is medically ready for discharge.  Patient currently intubated, and unable to communicate.  CSW to continue to follow patient's progress throughout discharge planning.   Expected Discharge Plan: Skilled Nursing Facility Barriers to Discharge: Continued Medical Work up   Patient Goals and CMS Choice Patient states their goals for this hospitalization and ongoing recovery are:: To return to SNF      Expected Discharge Plan and Services Expected Discharge Plan: [place]       Living arrangements for the past 2 months: [place]                                      Prior Living Arrangements/Services Living arrangements for the past 2 months: [place] Lives with:: Facility Resident Patient language and need for interpreter reviewed:: Yes Do you feel safe going back to the place where you live?: Yes      Need for Family Participation in Patient Care: Yes (Comment) Care giver support system in place?: Yes (comment)   Criminal Activity/Legal Involvement Pertinent to Current Situation/Hospitalization: No - Comment as needed  Activities of Daily Living      Permission Sought/Granted Permission sought to share information with : Facility Sport and exercise psychologist, Family Supports Permission granted to share information with : Yes, Release  of Information Signed  Share Information with NAME: Frisby,Jerrell Brother   [phone]  Aslan, Himes  [phone]  Permission granted to share info w AGENCY: SNF admissions        Emotional Assessment Appearance:: Appears stated age   Affect (typically observed): Accepting, Appropriate, Calm Orientation: : Oriented to Self Alcohol / Substance Use: Not Applicable Psych Involvement: No (comment)  Admission diagnosis:  Septic shock ([place]) [A41.9, R65.21] Sepsis ([place]) [A41.9] Respiratory failure with hypercapnia, unspecified chronicity ([place]) [J96.92] Cellulitis, unspecified cellulitis site [L03.90] Patient Active Problem List   Diagnosis Date Noted   Sepsis ([place]) 09/04/2021   Severe sepsis ([place]) 09/03/2021   Aortic atherosclerosis ([place]) 09/03/2021   Acute respiratory acidosis ([place]) 41/28/7867   Acute metabolic encephalopathy 67/20/9470   Septic shock ([place]) 09/03/2021   Acute on chronic respiratory failure with hypoxia and hypercapnia ([place]) 06/25/2021   CAP (community acquired pneumonia) 06/25/2021   Chronic diastolic CHF (congestive heart failure) ([place]) 06/25/2021   Benign prostatic hyperplasia with urinary obstruction 08/29/2020   Overactive bladder 08/29/2020   Impotence 08/29/2020   Urge incontinence of urine 08/29/2020   Chronic respiratory failure with hypoxia ([place]) 04/06/2019   Dyslipidemia 04/06/2019   Hypomagnesemia 04/06/2019   MDD (major depressive disorder) 04/06/2019   Psychosis ([place]) 04/05/2019   Dyspnea 02/12/2019   Pain in left wrist 96/28/3662   Diastolic dysfunction 94/76/5465   Primary skin malignancy with unknown  cell type 22/97/9892   Acute diastolic heart failure ([place]) 08/25/2016   Hypoxia 08/21/2016   Acute respiratory failure with hypoxia and hypercapnia (HCC) 08/21/2016   Moderate protein-calorie malnutrition ([place]) 08/21/2016   Stasis edema of both lower extremities 08/21/2016   Chronic pain 08/21/2016   Tobacco use disorder 02/23/2016    Venous stasis syndrome 02/09/2016   Spondylosis without myelopathy or radiculopathy, lumbar region 12/13/2015   AKI (acute kidney injury) ([place]) 11/02/2015   Hyperkalemia 11/02/2015   Hypoglycemia 11/02/2015   Cellulitis    Leg ulcer ([place])    Nonhealing ulcer of left lower extremity ([place])    Pain of left leg    Cellulitis of leg 04/25/2015   Degenerative arthritis of hip 04/25/2015   Cellulitis and abscess of leg    Microcytic anemia 03/27/2015   Hypertension    DM2 (diabetes mellitus, type 2) ([place]) 03/20/2015   MSSA (methicillin susceptible Staphylococcus aureus) infection 03/20/2015   Anemia, iron deficiency 03/20/2015   Cellulitis of left lower extremity    Pyomyositis    Systemic infection ([place]) 03/14/2015   PCP:  [name], MD Pharmacy:  No Pharmacies Listed    Social Determinants of Health (SDOH) Interventions    Readmission Risk Interventions No flowsheet data found.

## 2021-09-05 ENCOUNTER — Inpatient Hospital Stay (HOSPITAL_COMMUNITY): Payer: No Typology Code available for payment source

## 2021-09-05 DIAGNOSIS — R6520 Severe sepsis without septic shock: Secondary | ICD-10-CM | POA: Diagnosis not present

## 2021-09-05 DIAGNOSIS — A419 Sepsis, unspecified organism: Secondary | ICD-10-CM | POA: Diagnosis not present

## 2021-09-05 DIAGNOSIS — G9341 Metabolic encephalopathy: Secondary | ICD-10-CM | POA: Diagnosis not present

## 2021-09-05 LAB — COMPREHENSIVE METABOLIC PANEL
ALT: 62 U/L — ABNORMAL HIGH (ref 0–44)
AST: 82 U/L — ABNORMAL HIGH (ref 15–41)
Albumin: 2.5 g/dL — ABNORMAL LOW (ref 3.5–5.0)
Alkaline Phosphatase: 93 U/L (ref 38–126)
Anion gap: 9 (ref 5–15)
BUN: 27 mg/dL — ABNORMAL HIGH (ref 8–23)
CO2: 27 mmol/L (ref 22–32)
Calcium: 8.1 mg/dL — ABNORMAL LOW (ref 8.9–10.3)
Chloride: 100 mmol/L (ref 98–111)
Creatinine, Ser: 1.06 mg/dL (ref 0.61–1.24)
GFR, Estimated: >60 mL/min (ref >60)
Glucose, Bld: 188 mg/dL — ABNORMAL HIGH (ref 70–99)
Potassium: 3.7 mmol/L (ref 3.5–5.1)
Sodium: 136 mmol/L (ref 135–145)
Total Bilirubin: 0.4 mg/dL (ref 0.3–1.2)
Total Protein: 6.3 g/dL — ABNORMAL LOW (ref 6.5–8.1)

## 2021-09-05 LAB — PHOSPHORUS
Phosphorus: 3.4 mg/dL (ref 2.5–4.6)
Phosphorus: 3 mg/dL (ref 2.5–4.6)

## 2021-09-05 LAB — GLUCOSE, CAPILLARY
Glucose-Capillary: 199 mg/dL — ABNORMAL HIGH (ref 70–99)
Glucose-Capillary: 204 mg/dL — ABNORMAL HIGH (ref 70–99)
Glucose-Capillary: 216 mg/dL — ABNORMAL HIGH (ref 70–99)
Glucose-Capillary: 221 mg/dL — ABNORMAL HIGH (ref 70–99)
Glucose-Capillary: 208 mg/dL — ABNORMAL HIGH (ref 70–99)
Glucose-Capillary: 237 mg/dL — ABNORMAL HIGH (ref 70–99)

## 2021-09-05 LAB — CBC
HCT: 38.9 % — ABNORMAL LOW (ref 39.0–52.0)
Hemoglobin: 10.9 g/dL — ABNORMAL LOW (ref 13.0–17.0)
MCH: 20.3 pg — ABNORMAL LOW (ref 26.0–34.0)
MCHC: 28 g/dL — ABNORMAL LOW (ref 30.0–36.0)
MCV: 72.3 fL — ABNORMAL LOW (ref 80.0–100.0)
Platelets: 193 10*3/uL (ref 150–400)
RBC: 5.38 MIL/uL (ref 4.22–5.81)
RDW: 20.7 % — ABNORMAL HIGH (ref 11.5–15.5)
WBC: 7.7 10*3/uL (ref 4.0–10.5)
nRBC: 0 % (ref 0.0–0.2)

## 2021-09-05 LAB — MAGNESIUM
Magnesium: 1.9 mg/dL (ref 1.7–2.4)
Magnesium: 1.9 mg/dL (ref 1.7–2.4)

## 2021-09-05 LAB — TRIGLYCERIDES
Triglycerides: 804 mg/dL — ABNORMAL HIGH (ref <150)
Triglycerides: 972 mg/dL — ABNORMAL HIGH (ref <150)

## 2021-09-05 MED ORDER — ARIPIPRAZOLE 2 MG PO TABS
2.0000 mg | ORAL_TABLET | Freq: Every day | ORAL | Status: DC
  Administered 2021-09-05 – 2021-09-06 (×2): 2 mg
  Filled 2021-09-05 (×2): qty 1

## 2021-09-05 MED ORDER — VITAMIN D 25 MCG (1000 UNIT) PO TABS
2000.0000 [IU] | ORAL_TABLET | Freq: Every morning | ORAL | Status: DC
  Administered 2021-09-05 – 2021-09-06 (×2): 2000 [IU]
  Filled 2021-09-05 (×2): qty 2

## 2021-09-05 MED ORDER — OXYCODONE HCL 5 MG/5ML PO SOLN
10.0000 mg | ORAL | Status: DC | PRN
  Administered 2021-09-05 – 2021-09-06 (×3): 10 mg
  Filled 2021-09-05 (×3): qty 10

## 2021-09-05 MED ORDER — TRAZODONE HCL 50 MG PO TABS
50.0000 mg | ORAL_TABLET | Freq: Every day | ORAL | Status: DC
  Administered 2021-09-05 – 2021-09-06 (×2): 50 mg
  Filled 2021-09-05 (×2): qty 1

## 2021-09-05 MED ORDER — ADULT MULTIVITAMIN LIQUID CH
15.0000 mL | Freq: Every day | ORAL | Status: DC
Start: 2021-09-05 — End: 2021-09-06
  Administered 2021-09-05 – 2021-09-06 (×2): 15 mL
  Filled 2021-09-05 (×2): qty 15

## 2021-09-05 MED ORDER — PANTOPRAZOLE 2 MG/ML SUSPENSION
40.0000 mg | Freq: Every day | ORAL | Status: DC
  Administered 2021-09-05 – 2021-09-06 (×2): 40 mg
  Filled 2021-09-05 (×2): qty 20

## 2021-09-05 MED ORDER — ASPIRIN 81 MG PO CHEW
81.0000 mg | CHEWABLE_TABLET | Freq: Every day | ORAL | Status: DC
Start: 2021-09-05 — End: 2021-09-07
  Administered 2021-09-05 – 2021-09-06 (×2): 81 mg
  Filled 2021-09-05 (×2): qty 1

## 2021-09-05 MED ORDER — SODIUM CHLORIDE 0.9 % IV SOLN
2.0000 g | INTRAVENOUS | Status: DC
  Administered 2021-09-05 – 2021-09-10 (×6): 2 g via INTRAVENOUS
  Filled 2021-09-05 (×7): qty 20

## 2021-09-05 MED ORDER — INSULIN ASPART 100 UNIT/ML IJ SOLN
4.0000 [IU] | INTRAMUSCULAR | Status: DC
  Administered 2021-09-05 – 2021-09-08 (×12): 4 [IU] via SUBCUTANEOUS

## 2021-09-05 MED ORDER — DEXMEDETOMIDINE HCL IN NACL 200 MCG/50ML IV SOLN
0.4000 ug/kg/h | INTRAVENOUS | Status: DC
  Administered 2021-09-05: 22:00:00 0.7 ug/kg/h via INTRAVENOUS
  Administered 2021-09-05: 08:00:00 0.4 ug/kg/h via INTRAVENOUS
  Administered 2021-09-05 (×4): 0.6 ug/kg/h via INTRAVENOUS
  Administered 2021-09-06 (×2): 0.5 ug/kg/h via INTRAVENOUS
  Filled 2021-09-05 (×9): qty 50

## 2021-09-05 MED ORDER — FERROUS SULFATE 300 (60 FE) MG/5ML PO SYRP
300.0000 mg | ORAL_SOLUTION | Freq: Every day | ORAL | Status: DC
  Administered 2021-09-05 – 2021-09-06 (×2): 300 mg
  Filled 2021-09-05 (×2): qty 5

## 2021-09-05 MED ORDER — VILAZODONE HCL 40 MG PO TABS
40.0000 mg | ORAL_TABLET | Freq: Every morning | ORAL | Status: DC
  Filled 2021-09-05: qty 1

## 2021-09-05 NOTE — Progress Notes (Addendum)
K[facility]aroo Pump alarmed that line was clogged - not able to flush with lukewarm water or pull back,  Elink called for an order to replace OG tube and CXR to verify placement.  0236 - OGT replaced with respiratory at bedside, tube feeds held until placement is verified.  0940 - Xray placement verified, tube feeds restarted.

## 2021-09-05 NOTE — Progress Notes (Signed)
Patient taken out of wean mode and placed back into these current settings: VT 620, RR14, +8 PEEP, and 40% FIO2. No plan to extubate today. Patient is tolerating well at this time . RT will continue to monitor

## 2021-09-05 NOTE — Progress Notes (Signed)
NAME:  [name], MRN:  [id number], DOB:  1956/07/06, LOS: 2 ADMISSION DATE:  09/03/2021, CONSULTATION DATE:  12/12 REFERRING MD:  Dr. Sarajane Jews TRH, CHIEF COMPLAINT:  AMS   History of Present Illness:  Patient is encephalopathic and/or intubated. Therefore history has been obtained from chart review.   65 year old male with PMH as below, which is significant for HFpEF, HTN, OSA, DM, chronic hypoxemic respiratory failure on 2L, and previous cellulitis of the lower extremity.   He resides in SNF. In the AM hours of 12/12 he developed rigors and SNF staff noted his temperature to be 104F. Also noted bilatera lower extremity wounds with malodorous discharge.He was last known well in the early AM hours when he was seen outside smoking by staff. At baseline he is alert and oriented x4. Upon arrival to the ED he was febrile. Lower extremity dressings were taken down and purulent drainage was noted.   He was set to be admitted to the hospitalist service for treatment of sepsis secondary to bilateral lower extremity cellulitis, however, he subsequently became somnolent and eventually unarousable. ABG showed severe respiratory acidosis. The patient was intubated by the ED physician and PCCM was asked to transfer the patient to ICU.   Febrile in ED  Initially tachy 120 7.183/93/85.8 Prior CO2 on ABG 52-60s compensated 4L LR ?, 151ml/hr  Flagyl, cefepime, vanc  Pertinent  Medical History   has a past medical history of Anemia, Arthritis, Cellulitis and abscess of leg (hositalized 04/25/2015), Chronic diastolic CHF (congestive heart failure) ([place]) (06/25/2021), Chronic hip pain, Depression, GERD (gastroesophageal reflux disease), Hypercholesterolemia, Hypertension, Pneumonia (1960's X 1), Primary skin malignancy with unknown cell type (12/10/2016), Sleep apnea, Thalassemia minor, and Type II diabetes mellitus ([place]).  On 2L at home, unclear why.  No signs of emphysema on CT   CXR - R sided "effusion"  actually looks like R hemidiaphragm elevation on prior CT similar scout film findings, no effusion  Significant Hospital Events: Including procedures, antibiotic start and stop dates in addition to other pertinent events   12/12 admit on vent 12/14 decreasing sedation, SAT SBT?   Interim History / Subjective:   SAT SBT This am, decreasing sedation   Objective   Blood pressure 129/81, pulse 65, temperature 98.1 F (36.7 C), temperature source Bladder, resp. rate 20, height 6\' 5"  (1.956 m), weight 121.6 kg, SpO2 96 %.    Vent Mode: PRVC FiO2 (%):  [40 %-60 %] 40 % Set Rate:  [14 bmp-22 bmp] 14 bmp Vt Set:  [620 mL-710 mL] 620 mL PEEP:  [5 cmH20-8 cmH20] 8 cmH20 Plateau Pressure:  [22 cmH20-27 cmH20] 22 cmH20   Intake/Output Summary (Last 24 hours) at 09/05/2021 0813 Last data filed at 09/05/2021 8366 Gross per 24 hour  Intake 1213.06 ml  Output 1651 ml  Net -437.94 ml   Filed Weights   09/03/21 1837 09/05/21 0500  Weight: 120 kg 121.6 kg    Examination:  General: obese, elderly male, intubated on life support  HENT: NCAT tracking, sedated on vent  Lungs: BL vented breaths  Cardiovascular: RRR, s1 s2  Abdomen: Soft, nt nd  Extremities: no edema  Neuro: sedated, awakes to voice      CT reviewed > RLL pna, likely reactive adenopathy  The patient's images have been independently reviewed by me.    Resolved Hospital Problem list     Assessment & Plan:   Sepsis, shock: received 4L fluids. Strep bacteremia noted on BCID. Source lower extremity cellulitis  vs PNA P: Wean off pressors  I can de-escalate to ceftriaxone, give 14 days for GP bactermia  ECHO was neg for veg Repeat blood cultures  Strep bacteremia: identified on BCID HCAP: SNF resident.  Bilateral lower extremity cellulitis.  P: Continue ceftriaxone   Acute on chronic mixed respiratory failure secondary to above  - full vent support  - wean as tolerated  - SAT SBT this AM   Acute metabolic  encephalopathy. Likely 2/2 sepsis.  Need for sedation for mechanical ventilation -PAD guideline sedation  - propofol and fent prn   HFpEF - holding hom bp meds   OSA in history, but cannot determine if he is prescribed NIV - supportive care   Dysphagia: mechanical soft diet at baseline - NPO   DM - CBGs with SSI   Best Practice (right click and "Reselect all SmartList Selections" daily)   Diet/type: NPO DVT prophylaxis: prophylactic heparin  GI prophylaxis: PPI Lines: N/A Foley:  N/A Code Status:  full code Last date of multidisciplinary goals of care discussion []   Labs   CBC: Recent Labs  Lab 09/03/21 1321 09/03/21 2158  WBC 14.3* 17.4*  NEUTROABS 13.2*  --   HGB 13.1 11.4*  HCT 46.5 41.2  MCV 72.0* 72.5*  PLT 238 732    Basic Metabolic Panel: Recent Labs  Lab 09/03/21 1321 09/03/21 2158 09/04/21 2124 09/05/21 0241  NA 138  --   --   --   K 5.2*  --   --   --   CL 97*  --   --   --   CO2 33*  --   --   --   GLUCOSE 201*  --   --   --   BUN 32*  --   --   --   CREATININE 1.17 1.30*  --   --   CALCIUM 9.0  --   --   --   MG  --   --  1.9 1.9  PHOS  --   --  3.8 3.4   GFR: Estimated Creatinine Clearance: 79.6 mL/min (A) (by C-G formula based on SCr of 1.3 mg/dL (H)). Recent Labs  Lab 09/03/21 1321 09/03/21 1601 09/03/21 1836 09/03/21 2158  WBC 14.3*  --   --  17.4*  LATICACIDVEN 1.7 2.2* 1.7  --     Liver Function Tests: Recent Labs  Lab 09/03/21 1321  AST 26  ALT 26  ALKPHOS 107  BILITOT 0.3  PROT 8.2*  ALBUMIN 3.5   No results for input(s): LIPASE, AMYLASE in the last 168 hours. No results for input(s): AMMONIA in the last 168 hours.  ABG    Component Value Date/Time   PHART 7.390 09/04/2021 1555   PCO2ART 50.7 (H) 09/04/2021 1555   PO2ART 77.0 (L) 09/04/2021 1555   HCO3 30.0 (H) 09/04/2021 1555   TCO2 21 11/02/2015 2039   ACIDBASEDEF 6.0 (H) 11/02/2015 2039   O2SAT 94.2 09/04/2021 1555     Coagulation Profile: Recent  Labs  Lab 09/03/21 1321  INR 1.0    Cardiac Enzymes: No results for input(s): CKTOTAL, CKMB, CKMBINDEX, TROPONINI in the last 168 hours.  HbA1C: Hgb A1c MFr Bld  Date/Time Value Ref Range Status  09/03/2021 09:58 PM 10.2 (H) 4.8 - 5.6 % Final    Comment:    (NOTE) Pre diabetes:          5.7%-6.4%  Diabetes:              >  6.4%  Glycemic control for   <7.0% adults with diabetes   06/25/2021 01:10 AM 8.4 (H) 4.8 - 5.6 % Final    Comment:    (NOTE) Pre diabetes:          5.7%-6.4%  Diabetes:              >6.4%  Glycemic control for   <7.0% adults with diabetes     CBG: Recent Labs  Lab 09/04/21 1537 09/04/21 1956 09/04/21 2312 09/05/21 0350 09/05/21 0735  GLUCAP 101* 134* 194* 199* 204*    Review of Systems:   Patient is encephalopathic and/or intubated. Therefore history has been obtained from chart review.    Past Medical History:  He,  has a past medical history of Anemia, Arthritis, Cellulitis and abscess of leg (hositalized 04/25/2015), Chronic diastolic CHF (congestive heart failure) ([place]) (06/25/2021), Chronic hip pain, Depression, GERD (gastroesophageal reflux disease), Hypercholesterolemia, Hypertension, Pneumonia (1960's X 1), Primary skin malignancy with unknown cell type (12/10/2016), Sleep apnea, Thalassemia minor, and Type II diabetes mellitus ([place]).   Surgical History:   Past Surgical History:  Procedure Laterality Date   INGUINAL HERNIA REPAIR Right ~ 2004   SHOULDER ARTHROSCOPY W/ ROTATOR CUFF REPAIR Right ~ 2004   TOENAIL EXCISION Right 1980's X 2   "big toe"     Social History:   reports that he has been smoking cigarettes. He has a 5.40 pack-year smoking history. He has never used smokeless tobacco. He reports that he does not drink alcohol and does not use drugs.   Family History:  His family history includes Diabetes Mellitus II in his brother, father, and mother.   Allergies Allergies  Allergen Reactions   Other Other (See  Comments)    Pt is a Jehovah Witness. No blood products.   Sulfa Antibiotics Other (See Comments)    Causes flu-like symptom : sweating, chills, fever, body aches     Home Medications  Prior to Admission medications   Medication Sig Start Date End Date Taking? Authorizing Provider  acetaminophen (TYLENOL) 325 MG tablet Take 2 tablets (650 mg total) by mouth every 6 (six) hours as needed for mild pain (or Fever >/= 101). 03/23/15   Robbie Lis, MD  ARIPiprazole (ABILIFY) 2 MG tablet Take 2 mg by mouth at bedtime. 04/27/21   [provider]  aspirin EC 81 MG tablet Take 81 mg by mouth every morning.    [provider]  benzocaine (ORAJEL) 10 % mucosal gel Use as directed 1 application in the mouth or throat every 2 (two) hours as needed for mouth pain (soreness).    [provider]  betamethasone dipropionate 0.05 % lotion Apply 1 application topically every 12 (twelve) hours as needed (itching). 06/07/21   [provider]  chlorhexidine (PERIDEX) 0.12 % solution Use as directed 5 mLs in the mouth or throat 2 (two) times daily.    [provider]  Cholecalciferol (VITAMIN D) 50 MCG (2000 UT) tablet Take 2,000 Units by mouth daily.    [provider]  ferrous sulfate 325 (65 FE) MG tablet Take 325 mg by mouth daily.    [provider]  furosemide (LASIX) 20 MG tablet Take 60 mg by mouth 2 (two) times daily. 05/16/21   [provider]  gabapentin (NEURONTIN) 300 MG capsule Take 300 mg by mouth 3 (three) times daily. 05/16/21   [provider]  Glucagon HCl (GLUCAGON EMERGENCY) 1 MG/ML SOLR Inject 1 mg as directed every  6 (six) hours as needed (low CBG).    [provider]  hydrocortisone (ANUSOL-HC) 2.5 % rectal cream Apply 1 application topically every 12 (twelve) hours as needed (rash). 02/10/21   [provider]  hydrocortisone (ANUSOL-HC) 2.5 % rectal cream Place 1 application rectally daily as needed  for hemorrhoids or anal itching.    [provider]  insulin aspart (NOVOLOG) 100 UNIT/ML injection Before each meal 3 times a day, 140-199 - 2 units, 200-250 - 4 units, 251-299 - 6 units,  300-349 - 8 units,  350 or above 10 units. Dispense syringes and needles as needed, Ok to switch to PEN if approved. Substitute to any brand approved. DX DM2, Code E11.65 Patient taking differently: Inject 14 Units into the skin as directed. Inject 14 units TID & Sliding Scale QID: If 150-200=2 units, 201-250=5 units, 251-300=10 units, 301-350=15 units If > 350=20 units 11/13/15   Thurnell Lose, MD  Insulin Glargine (BASAGLAR KWIKPEN) 100 UNIT/ML Inject 30 Units into the skin 2 (two) times daily. 06/11/21   [provider]  lisinopril (PRINIVIL,ZESTRIL) 2.5 MG tablet Take 1 tablet (2.5 mg total) by mouth daily. 11/13/15   Thurnell Lose, MD  melatonin 3 MG TABS tablet Take 3 mg by mouth at bedtime.    [provider]  METAMUCIL FIBER PO Take 2 Wafers by mouth in the morning and at bedtime.    [provider]  metFORMIN (GLUCOPHAGE) 500 MG tablet Take 500 mg by mouth 2 (two) times daily with a meal. 10/15/17   [provider]  Multiple Vitamin (MULTIVITAMIN WITH MINERALS) TABS tablet Take 1 tablet by mouth daily. 03/23/15   Robbie Lis, MD  nitroGLYCERIN (NITROSTAT) 0.4 MG SL tablet Place 0.4 mg under the tongue every 5 (five) minutes as needed for chest pain.    [provider]  tamsulosin (FLOMAX) 0.4 MG CAPS capsule Take 0.8 mg by mouth at bedtime.    [provider]  tiZANidine (ZANAFLEX) 4 MG tablet Take 4 mg by mouth every 8 (eight) hours as needed for muscle spasms.    [provider]  TRADJENTA 5 MG TABS tablet Take 5 mg by mouth daily. 05/04/21   [provider]  traZODone (DESYREL) 50 MG tablet Take 25 mg by mouth at bedtime as needed. 06/08/21   [provider]  triamcinolone cream (KENALOG) 0.1 % Apply 1  application topically 2 (two) times daily. 06/18/21   [provider]  VIIBRYD 40 MG TABS Take 40 mg by mouth daily. 05/16/21   [provider]     This patient is critically ill with multiple organ system failure; which, requires frequent high complexity decision making, assessment, support, evaluation, and titration of therapies. This was completed through the application of advanced monitoring technologies and extensive interpretation of multiple databases. During this encounter critical care time was devoted to patient care services described in this note for 32 minutes.  Garner Nash, DO [facility] 09/05/2021 8:14 AM      ,

## 2021-09-05 NOTE — Progress Notes (Signed)
Inpatient Diabetes Program Recommendations  AACE/ADA: New Consensus Statement on Inpatient Glycemic Control (2015)  Target Ranges:  Prepandial:   less than 140 mg/dL      Peak postprandial:   less than 180 mg/dL (1-2 hours)      Critically ill patients:  140 - 180 mg/dL   Lab Results  Component Value Date   GLUCAP 204 (H) 09/05/2021   HGBA1C 10.2 (H) 09/03/2021    Review of Glycemic Control  Latest Reference Range & Units 09/04/21 19:56 09/04/21 23:12 09/05/21 03:50 09/05/21 07:35  Glucose-Capillary 70 - 99 mg/dL 134 (H) 194 (H) 199 (H) 204 (H)   Diabetes history: DM 2 Outpatient Diabetes medications: Basaglar 30 units bid, Regular insulin 2-10 units tid, Tradjenta 5 mg Daily Current orders for Inpatient glycemic control:  Novolog 0-15 units Q4 hours  Vital HP 91ml/hour  Inpatient Diabetes Program Recommendations:    -  consider Novolog 4 units Q4 Tube Feed Coverage  Thanks,  Tama Headings RN, MSN, BC-ADM Inpatient Diabetes Coordinator Team Pager [phone] (8a-5p)

## 2021-09-05 NOTE — Progress Notes (Signed)
Patient was placed in wean mode. He appears to tolerate PS 6 ,PEEP 6 and 40% FIO2. The ventilator was alarming at any lower rate. RT will continue to monitor

## 2021-09-06 LAB — CBC
HCT: 41 % (ref 39.0–52.0)
Hemoglobin: 11.5 g/dL — ABNORMAL LOW (ref 13.0–17.0)
MCH: 20.1 pg — ABNORMAL LOW (ref 26.0–34.0)
MCHC: 28 g/dL — ABNORMAL LOW (ref 30.0–36.0)
MCV: 71.7 fL — ABNORMAL LOW (ref 80.0–100.0)
Platelets: 196 10*3/uL (ref 150–400)
RBC: 5.72 MIL/uL (ref 4.22–5.81)
RDW: 20.4 % — ABNORMAL HIGH (ref 11.5–15.5)
WBC: 5.6 10*3/uL (ref 4.0–10.5)
nRBC: 0 % (ref 0.0–0.2)

## 2021-09-06 LAB — GLUCOSE, CAPILLARY
Glucose-Capillary: 122 mg/dL — ABNORMAL HIGH (ref 70–99)
Glucose-Capillary: 132 mg/dL — ABNORMAL HIGH (ref 70–99)
Glucose-Capillary: 165 mg/dL — ABNORMAL HIGH (ref 70–99)
Glucose-Capillary: 202 mg/dL — ABNORMAL HIGH (ref 70–99)
Glucose-Capillary: 245 mg/dL — ABNORMAL HIGH (ref 70–99)
Glucose-Capillary: 276 mg/dL — ABNORMAL HIGH (ref 70–99)

## 2021-09-06 LAB — CULTURE, BLOOD (ROUTINE X 2)

## 2021-09-06 LAB — COMPREHENSIVE METABOLIC PANEL
ALT: 80 U/L — ABNORMAL HIGH (ref 0–44)
AST: 72 U/L — ABNORMAL HIGH (ref 15–41)
Albumin: 2.5 g/dL — ABNORMAL LOW (ref 3.5–5.0)
Alkaline Phosphatase: 118 U/L (ref 38–126)
Anion gap: 10 (ref 5–15)
BUN: 27 mg/dL — ABNORMAL HIGH (ref 8–23)
CO2: 24 mmol/L (ref 22–32)
Calcium: 8.2 mg/dL — ABNORMAL LOW (ref 8.9–10.3)
Chloride: 103 mmol/L (ref 98–111)
Creatinine, Ser: 0.85 mg/dL (ref 0.61–1.24)
GFR, Estimated: >60 mL/min (ref >60)
Glucose, Bld: 293 mg/dL — ABNORMAL HIGH (ref 70–99)
Potassium: 3.7 mmol/L (ref 3.5–5.1)
Sodium: 137 mmol/L (ref 135–145)
Total Bilirubin: 0.3 mg/dL (ref 0.3–1.2)
Total Protein: 6.7 g/dL (ref 6.5–8.1)

## 2021-09-06 LAB — PHOSPHORUS: Phosphorus: 2.8 mg/dL (ref 2.5–4.6)

## 2021-09-06 LAB — CULTURE, RESPIRATORY W GRAM STAIN: Culture: NORMAL

## 2021-09-06 LAB — MAGNESIUM: Magnesium: 2 mg/dL (ref 1.7–2.4)

## 2021-09-06 MED ORDER — ENSURE MAX PROTEIN PO LIQD
11.0000 [oz_av] | Freq: Every day | ORAL | Status: DC
  Administered 2021-09-06 – 2021-09-09 (×4): 11 [oz_av] via ORAL
  Filled 2021-09-06 (×5): qty 330

## 2021-09-06 MED ORDER — POTASSIUM CHLORIDE 20 MEQ PO PACK
40.0000 meq | PACK | Freq: Once | ORAL | Status: AC
  Administered 2021-09-06: 40 meq
  Filled 2021-09-06: qty 2

## 2021-09-06 MED ORDER — ADULT MULTIVITAMIN W/MINERALS CH
1.0000 | ORAL_TABLET | Freq: Every day | ORAL | Status: DC
  Administered 2021-09-07 – 2021-09-10 (×4): 1 via ORAL
  Filled 2021-09-06 (×4): qty 1

## 2021-09-06 MED ORDER — ENSURE ENLIVE PO LIQD: 237.0000 mL | Freq: Two times a day (BID) | ORAL | Status: DC

## 2021-09-06 MED ORDER — GLUCERNA SHAKE PO LIQD
237.0000 mL | ORAL | Status: DC
  Administered 2021-09-07 – 2021-09-10 (×4): 237 mL via ORAL
  Filled 2021-09-06 (×5): qty 237

## 2021-09-06 MED ORDER — PROSOURCE PLUS PO LIQD
30.0000 mL | Freq: Every day | ORAL | Status: DC
  Administered 2021-09-07 – 2021-09-09 (×3): 30 mL via ORAL
  Filled 2021-09-06 (×3): qty 30

## 2021-09-06 NOTE — Plan of Care (Signed)
°  Problem: Respiratory: Goal: Ability to maintain adequate ventilation will improve Outcome: Progressing   Problem: Education: Goal: Knowledge of General Education information will improve Description: Including pain rating scale, medication(s)/side effects and non-pharmacologic comfort measures Outcome: Progressing   Problem: Clinical Measurements: Goal: Respiratory complications will improve Outcome: Progressing   Problem: Nutrition: Goal: Adequate nutrition will be maintained Outcome: Progressing

## 2021-09-06 NOTE — Procedures (Signed)
Extubation Procedure Note  Patient Details:   Name: Leib Elahi DOB: 1955/10/22 MRN: [id number]   Airway Documentation:  Airway 7.5 mm (Active)  Secured at (cm) 24 cm 09/06/21 1001  Measured From Lips 09/06/21 1001  [place]acy 09/06/21 1001  Secured By [employer] 09/06/21 1001  Tube Holder Repositioned Yes 09/06/21 1001  Prone position No 09/06/21 1001  Cuff Pressure (cm H2O) Clear OR 27-39 CmH2O 09/06/21 1001  Site Condition Dry 09/06/21 1001   Vent end date: (not recorded) Vent end time: (not recorded)   Evaluation  O2 sats: stable throughout Complications: No apparent complications Patient did tolerate procedure well. Bilateral Breath Sounds: Clear   Yes Patient extubated to a 4L [place]. He is tolerating well at this time with no distress  Joellyn Rued 09/06/2021, 11:03 AM

## 2021-09-06 NOTE — Progress Notes (Addendum)
Inpatient Diabetes Program Recommendations  AACE/ADA: New Consensus Statement on Inpatient Glycemic Control (2015)  Target Ranges:  Prepandial:   less than 140 mg/dL      Peak postprandial:   less than 180 mg/dL (1-2 hours)      Critically ill patients:  140 - 180 mg/dL   Lab Results  Component Value Date   GLUCAP 245 (H) 09/06/2021   HGBA1C 10.2 (H) 09/03/2021    Review of Glycemic Control  Latest Reference Range & Units 09/05/21 11:26 09/05/21 15:30 09/05/21 19:45 09/05/21 23:18 09/06/21 03:08 09/06/21 07:52  Glucose-Capillary 70 - 99 mg/dL 216 (H) 221 (H) 208 (H) 237 (H) 276 (H) 245 (H)   Diabetes history: DM 2 Outpatient Diabetes medications: Basaglar 30 units bid, Regular insulin 2-10 units tid, Tradjenta 5 mg Daily Current orders for Inpatient glycemic control:  Novolog 0-15 units Q4 hours Novolog 4 units Q4 Tube Feed Coverage  Vital HP 62ml/hour  Inpatient Diabetes Program Recommendations:    -  consider increasing Tube Feed coverage to Novolog 8 units Q4  Thanks,  Tama Headings RN, MSN, BC-ADM Inpatient Diabetes Coordinator Team Pager [phone] (8a-5p)

## 2021-09-06 NOTE — Progress Notes (Signed)
Patient placed in wean mode 5/5 40% PSV/CPAP

## 2021-09-06 NOTE — Progress Notes (Signed)
Nutrition Follow-up  DOCUMENTATION CODES:   Obesity unspecified  INTERVENTION:  - diet advancement as feasible. - will order Glucerna Shake once/day, each supplement provides 220 kcal and 10 grams of protein. - will order Ensure Max once/day, each supplement provides 150 kcal and 30 grams of protein. - will order 30 ml Prosource Plus once/day, each supplement provides 100 kcal and 15 grams protein.  - will order 1 tablet multivitamin with minerals/day.    NUTRITION DIAGNOSIS:   Increased nutrient needs related to wound healing as evidenced by estimated needs. -ongoing  GOAL:   Patient will meet greater than or equal to 90% of their needs -unable to meet at this time  MONITOR:   Diet advancement, Labs, Weight trends, Skin   ASSESSMENT:   65 year old male with PMH as below, which is significant for HFpEF, HTN, OSA, DM, chronic hypoxemic respiratory failure on 2L, and previous cellulitis of the lower extremity.  Patient was extubated and OGT removed today ~1105. Patient remains NPO at this time. Patient was dicussed in rounds this AM and plan is for swallow study by RN.   Patient is currently laying in bed, on the phone, with no visitors at bedside.   Estimated nutrition needs remain appropriate post-extubation. Weight today is +5 lb compared to yesterday. Generalized edema documented in the edema section of flow sheet. He is noted to be +1.4 L since admission.    Labs reviewed; CBGs: 276, 245, 122 mg/dl, BUN: 27 mg/dl, Ca: 8.2 mg/dl, LFTs elevated.   Medications reviewed; 2000 units cholecalciferol/day, 300 mg ferrous sulfate/day, sliding scale novolog, 4 units novolog every 4 hours, 40 mg protonix/day, 40 mEq Klor-Con x1 dose 12/15.   Diet Order:   Diet Order             Diet NPO time specified  Diet effective now                   EDUCATION NEEDS:   Not appropriate for education at this time  Skin:  Skin Assessment: Skin Integrity Issues: Skin Integrity  Issues:: Other (Comment) Other: venous stasis ulcer to R pretibial area and L tibial area  Last BM:  12/14 (type 6 x2); smear on 12/15  Height:   Ht Readings from Last 1 Encounters:  09/04/21 6\' 5"  (1.956 m)    Weight:   Wt Readings from Last 1 Encounters:  09/06/21 123.8 kg     Estimated Nutritional Needs:  Kcal:  2300-2500 Protein:  170-180g Fluid:  2L/day     Jarome Matin, MS, RD, LDN, CNSC Inpatient Clinical Dietitian RD pager # available in [facility]  After hours/weekend pager # available in [facility]

## 2021-09-06 NOTE — Progress Notes (Signed)
NAME:  [name], MRN:  [id number], DOB:  11-14-1955, LOS: 3 ADMISSION DATE:  09/03/2021, CONSULTATION DATE:  12/12 REFERRING MD:  Dr. Sarajane Jews TRH, CHIEF COMPLAINT:  AMS   History of Present Illness:  Patient is encephalopathic and/or intubated. Therefore history has been obtained from chart review.   65 year old male with PMH as below, which is significant for HFpEF, HTN, OSA, DM, chronic hypoxemic respiratory failure on 2L, and previous cellulitis of the lower extremity.   He resides in SNF. In the AM hours of 12/12 he developed rigors and SNF staff noted his temperature to be 104F. Also noted bilatera lower extremity wounds with malodorous discharge.He was last known well in the early AM hours when he was seen outside smoking by staff. At baseline he is alert and oriented x4. Upon arrival to the ED he was febrile. Lower extremity dressings were taken down and purulent drainage was noted.   He was set to be admitted to the hospitalist service for treatment of sepsis secondary to bilateral lower extremity cellulitis, however, he subsequently became somnolent and eventually unarousable. ABG showed severe respiratory acidosis. The patient was intubated by the ED physician and PCCM was asked to transfer the patient to ICU.   Febrile in ED  Initially tachy 120 7.183/93/85.8 Prior CO2 on ABG 52-60s compensated 4L LR ?, 156ml/hr  Flagyl, cefepime, vanc  Pertinent  Medical History   has a past medical history of Anemia, Arthritis, Cellulitis and abscess of leg (hositalized 04/25/2015), Chronic diastolic CHF (congestive heart failure) ([place]) (06/25/2021), Chronic hip pain, Depression, GERD (gastroesophageal reflux disease), Hypercholesterolemia, Hypertension, Pneumonia (1960's X 1), Primary skin malignancy with unknown cell type (12/10/2016), Sleep apnea, Thalassemia minor, and Type II diabetes mellitus ([address]).  On 2L at home, unclear why.  No signs of emphysema on CT   CXR - R sided "effusion"  actually looks like R hemidiaphragm elevation on prior CT similar scout film findings, no effusion  Significant Hospital Events: Including procedures, antibiotic start and stop dates in addition to other pertinent events   12/12 admit on vent 12/14 decreasing sedation, SAT SBT?   Interim History / Subjective:   No events. Weaning well. Has L>R leg pain. Having diarrhea, flexiseal to be placed.  Objective   Blood pressure 136/61, pulse 61, temperature 98.6 F (37 C), temperature source Bladder, resp. rate 17, height 6\' 5"  (1.956 m), weight 123.8 kg, SpO2 100 %.    Vent Mode: CPAP;PSV FiO2 (%):  [40 %] 40 % Set Rate:  [14 bmp] 14 bmp Vt Set:  [620 mL] 620 mL PEEP:  [5 cmH20-8 cmH20] 5 cmH20 Pressure Support:  [6 cmH20] 6 cmH20 Plateau Pressure:  [17 cmH20-28 cmH20] 21 cmH20   Intake/Output Summary (Last 24 hours) at 09/06/2021 1013 Last data filed at 09/06/2021 0430 Gross per 24 hour  Intake 1009.08 ml  Output 1655 ml  Net -645.92 ml    Filed Weights   09/03/21 1837 09/05/21 0500 09/06/21 0420  Weight: 120 kg 121.6 kg 123.8 kg    Examination:  No distress Lungs diminished at bases Chronic lymphedema in legs, wrapped, see yesterday's note for photos Moves all 4 ext to command, weak  Sugars are up Mild transaminitis stable TG up CBC benign  CXR stable R sided volume loss  Resolved Hospital Problem list     Assessment & Plan:  Septic shock secondary to group B strep with likely source being chronic leg wounds Acute on chronic hypoxemic respiratory failure in setting of  sepsis and RLL pneumonia HFpEF OSA/OHS intolerant to CPAP Baseline dysphagia DM2 with hyperglycemia Chronic deconditioning TF-associated diarrhea  - Wean to extubate, IS, flutter, mobility as able - May need to add long-acting insulin, will see how does with TF off - Wound care per Judeen Hammans Doty's note from 12/13, appreciate help - Ceftriaxone x 7 days - Flexiseal okay for now given  peri-anal discomfort  Best Practice (right click and "Reselect all SmartList Selections" daily)   Diet/type: TF DVT prophylaxis: prophylactic heparin  GI prophylaxis: PPI Lines: N/A Foley:  N/A Code Status:  full code Last date of multidisciplinary goals of care discussion []    Patient critically ill due to respiratory failure Interventions to address this today vent titration and hopefully liberation Risk of deterioration without these interventions is high  I personally spent 38 minutes providing critical care not including any separately billable procedures  Erskine Emery MD [facility]  Prefer epic messenger for cross cover needs If after hours, please call E-link  ,

## 2021-09-06 NOTE — Progress Notes (Signed)
Patient informed nurse that he walks at his residence with a walker and wished to transfer to chair.  Patient was barely able to dangle at bedside with 2 nurses assisting. Patient was not able to independently dangle at all.   This nurse recommends PT/OT to assess patient before moving forward with ambulation attempts.   Overhead lift is recommended at this time for transfers.

## 2021-09-07 DIAGNOSIS — I5032 Chronic diastolic (congestive) heart failure: Secondary | ICD-10-CM

## 2021-09-07 DIAGNOSIS — Z794 Long term (current) use of insulin: Secondary | ICD-10-CM

## 2021-09-07 DIAGNOSIS — I700 Atherosclerosis of aorta: Secondary | ICD-10-CM

## 2021-09-07 DIAGNOSIS — L97929 Non-pressure chronic ulcer of unspecified part of left lower leg with unspecified severity: Secondary | ICD-10-CM

## 2021-09-07 DIAGNOSIS — I87303 Chronic venous hypertension (idiopathic) without complications of bilateral lower extremity: Secondary | ICD-10-CM

## 2021-09-07 DIAGNOSIS — J9611 Chronic respiratory failure with hypoxia: Secondary | ICD-10-CM

## 2021-09-07 DIAGNOSIS — E119 Type 2 diabetes mellitus without complications: Secondary | ICD-10-CM

## 2021-09-07 DIAGNOSIS — R6521 Severe sepsis with septic shock: Secondary | ICD-10-CM

## 2021-09-07 LAB — GLUCOSE, CAPILLARY
Glucose-Capillary: 141 mg/dL — ABNORMAL HIGH (ref 70–99)
Glucose-Capillary: 132 mg/dL — ABNORMAL HIGH (ref 70–99)
Glucose-Capillary: 136 mg/dL — ABNORMAL HIGH (ref 70–99)
Glucose-Capillary: 146 mg/dL — ABNORMAL HIGH (ref 70–99)
Glucose-Capillary: 140 mg/dL — ABNORMAL HIGH (ref 70–99)
Glucose-Capillary: 144 mg/dL — ABNORMAL HIGH (ref 70–99)

## 2021-09-07 LAB — COMPREHENSIVE METABOLIC PANEL
ALT: 67 U/L — ABNORMAL HIGH (ref 0–44)
AST: 54 U/L — ABNORMAL HIGH (ref 15–41)
Albumin: 2.6 g/dL — ABNORMAL LOW (ref 3.5–5.0)
Alkaline Phosphatase: 141 U/L — ABNORMAL HIGH (ref 38–126)
Anion gap: 8 (ref 5–15)
BUN: 18 mg/dL (ref 8–23)
CO2: 26 mmol/L (ref 22–32)
Calcium: 8.2 mg/dL — ABNORMAL LOW (ref 8.9–10.3)
Chloride: 105 mmol/L (ref 98–111)
Creatinine, Ser: 0.86 mg/dL (ref 0.61–1.24)
GFR, Estimated: >60 mL/min (ref >60)
Glucose, Bld: 157 mg/dL — ABNORMAL HIGH (ref 70–99)
Potassium: 3.9 mmol/L (ref 3.5–5.1)
Sodium: 139 mmol/L (ref 135–145)
Total Bilirubin: 0.6 mg/dL (ref 0.3–1.2)
Total Protein: 6.8 g/dL (ref 6.5–8.1)

## 2021-09-07 LAB — CBC
HCT: 38 % — ABNORMAL LOW (ref 39.0–52.0)
Hemoglobin: 10.8 g/dL — ABNORMAL LOW (ref 13.0–17.0)
MCH: 20 pg — ABNORMAL LOW (ref 26.0–34.0)
MCHC: 28.4 g/dL — ABNORMAL LOW (ref 30.0–36.0)
MCV: 70.2 fL — ABNORMAL LOW (ref 80.0–100.0)
Platelets: 205 10*3/uL (ref 150–400)
RBC: 5.41 MIL/uL (ref 4.22–5.81)
RDW: 19.9 % — ABNORMAL HIGH (ref 11.5–15.5)
WBC: 6.6 10*3/uL (ref 4.0–10.5)
nRBC: 0 % (ref 0.0–0.2)

## 2021-09-07 MED ORDER — LABETALOL HCL 5 MG/ML IV SOLN: 10.0000 mg | INTRAVENOUS | Status: DC | PRN

## 2021-09-07 MED ORDER — HYDROCORTISONE (PERIANAL) 2.5 % EX CREA: 1.0000 "application " | TOPICAL_CREAM | Freq: Every day | CUTANEOUS | Status: DC | PRN

## 2021-09-07 MED ORDER — FERROUS SULFATE 325 (65 FE) MG PO TABS
325.0000 mg | ORAL_TABLET | Freq: Every day | ORAL | Status: DC
  Administered 2021-09-07 – 2021-09-10 (×4): 325 mg via ORAL
  Filled 2021-09-07 (×4): qty 1

## 2021-09-07 MED ORDER — TRAZODONE HCL 50 MG PO TABS
50.0000 mg | ORAL_TABLET | Freq: Every day | ORAL | Status: DC
  Administered 2021-09-07 – 2021-09-10 (×4): 50 mg via ORAL
  Filled 2021-09-07 (×4): qty 1

## 2021-09-07 MED ORDER — FUROSEMIDE 40 MG PO TABS
60.0000 mg | ORAL_TABLET | Freq: Two times a day (BID) | ORAL | Status: DC
  Administered 2021-09-07 – 2021-09-10 (×7): 60 mg via ORAL
  Filled 2021-09-07 (×7): qty 1

## 2021-09-07 MED ORDER — CHLORHEXIDINE GLUCONATE 0.12 % MT SOLN
OROMUCOSAL | Status: AC
  Administered 2021-09-07: 15 mL
  Filled 2021-09-07: qty 15

## 2021-09-07 MED ORDER — LISINOPRIL 2.5 MG PO TABS
2.5000 mg | ORAL_TABLET | Freq: Every morning | ORAL | Status: DC
  Administered 2021-09-07 – 2021-09-10 (×4): 2.5 mg via ORAL
  Filled 2021-09-07 (×4): qty 1

## 2021-09-07 MED ORDER — PANTOPRAZOLE SODIUM 40 MG PO TBEC
40.0000 mg | DELAYED_RELEASE_TABLET | Freq: Every day | ORAL | Status: DC
  Administered 2021-09-07 – 2021-09-10 (×4): 40 mg via ORAL
  Filled 2021-09-07 (×4): qty 1

## 2021-09-07 MED ORDER — GABAPENTIN 300 MG PO CAPS
300.0000 mg | ORAL_CAPSULE | Freq: Three times a day (TID) | ORAL | Status: DC
  Administered 2021-09-07 – 2021-09-10 (×11): 300 mg via ORAL
  Filled 2021-09-07 (×11): qty 1

## 2021-09-07 MED ORDER — VITAMIN D 25 MCG (1000 UNIT) PO TABS
2000.0000 [IU] | ORAL_TABLET | Freq: Every morning | ORAL | Status: DC
  Administered 2021-09-07 – 2021-09-10 (×4): 2000 [IU] via ORAL
  Filled 2021-09-07 (×4): qty 2

## 2021-09-07 MED ORDER — ASPIRIN EC 81 MG PO TBEC: 81.0000 mg | DELAYED_RELEASE_TABLET | ORAL | Status: DC

## 2021-09-07 MED ORDER — SALINE SPRAY 0.65 % NA SOLN
1.0000 | NASAL | Status: DC | PRN
  Filled 2021-09-07: qty 44

## 2021-09-07 MED ORDER — ASPIRIN 81 MG PO CHEW
81.0000 mg | CHEWABLE_TABLET | Freq: Every day | ORAL | Status: DC
  Administered 2021-09-07 – 2021-09-10 (×4): 81 mg via ORAL
  Filled 2021-09-07 (×4): qty 1

## 2021-09-07 MED ORDER — OXYCODONE HCL 5 MG PO TABS
10.0000 mg | ORAL_TABLET | ORAL | Status: DC | PRN
  Administered 2021-09-07 – 2021-09-10 (×12): 10 mg via ORAL
  Filled 2021-09-07 (×12): qty 2

## 2021-09-07 MED ORDER — TAMSULOSIN HCL 0.4 MG PO CAPS
0.8000 mg | ORAL_CAPSULE | Freq: Every day | ORAL | Status: DC
  Administered 2021-09-07 – 2021-09-10 (×4): 0.8 mg via ORAL
  Filled 2021-09-07 (×4): qty 2

## 2021-09-07 MED ORDER — CHLORHEXIDINE GLUCONATE CLOTH 2 % EX PADS
6.0000 | MEDICATED_PAD | Freq: Every day | CUTANEOUS | Status: DC
  Administered 2021-09-08 – 2021-09-10 (×3): 6 via TOPICAL

## 2021-09-07 MED ORDER — ORAL CARE MOUTH RINSE
15.0000 mL | Freq: Two times a day (BID) | OROMUCOSAL | Status: DC
  Administered 2021-09-07 – 2021-09-10 (×7): 15 mL via OROMUCOSAL

## 2021-09-07 MED ORDER — ARIPIPRAZOLE 2 MG PO TABS
2.0000 mg | ORAL_TABLET | Freq: Every day | ORAL | Status: DC
  Administered 2021-09-07 – 2021-09-10 (×4): 2 mg via ORAL
  Filled 2021-09-07 (×4): qty 1

## 2021-09-07 NOTE — Progress Notes (Signed)
eLink Physician-Brief Progress Note Patient Name: Jaidev Sanger DOB: 02/12/1956 MRN: [id number]   Date of Service  09/07/2021  HPI/Events of Note  Patient with sub-optimal blood pressure control.  eICU Interventions  PRN Labetalol ordered.        Kerry Kass Lavone Weisel 09/07/2021, 12:02 AM

## 2021-09-07 NOTE — Progress Notes (Signed)
PROGRESS NOTE  Rafel Garde ZOX:[id number] DOB: [birth year] DOA: 09/03/2021 PCP: [name], MD   LOS: 4 days   Brief narrative:  65 year old male with past medical history of heart failure with preserved ejection fraction, hypertension, obstructive sleep apnea, diabetes mellitus, chronic hypoxic respiratory failure on 2 L of oxygen and history of previous cellulitis of the lower extremity presented to hospital from skilled nursing facility with fever, chills and bilateral lower extremity wounds with malodorous discharge.patient was initially admitted to ICU because he was somnolent and unarousable with severe respiratory acidosis on ABG. Patient was intubated by the ED physician and PCCM was asked to transfer the the ED and was admitted ICU for further management.  Subsequently, patient was considered stable for transfer out of the ICU.  Assessment/Plan:  Principal Problem:   Severe sepsis (HCC) Active Problems:   Cellulitis of left lower extremity   DM2 (diabetes mellitus, type 2) (HCC)   Nonhealing ulcer of left lower extremity (HCC)   Stasis edema of both lower extremities   Chronic respiratory failure with hypoxia (HCC)   Acute on chronic respiratory failure with hypoxia and hypercapnia (HCC)   Chronic diastolic CHF (congestive heart failure) (HCC)   Aortic atherosclerosis (HCC)   Acute respiratory acidosis (HCC)   Acute metabolic encephalopathy   Septic shock (HCC)   Sepsis (HCC)   Septic shock secondary to group B streptococcus from chronic leg wounds and cellulitis, right lower lobe pneumonia.   Improved at this time.  Septic shock has resolved at this time.  Continue IV Rocephin.  Continue wound care.  Wound care on board.  Acute on chronic hypoxemic respiratory failure Likely multifactorial from sepsis right lower lobe pneumonia and heart failure with preserved ejection fraction on the background of sleep apnea.  Continue to treat the underlying cause.  Patient does  have history of chronic hypoxic respiratory failure at baseline.  Chest x-ray with right-sided volume loss.  chronic diastolic heart failure.  Will resume diuretic from today.  Check BMP closely.  HFpEF with OSA/OHS intolerant to CPAP Continue supplemental oxygen.  Patient takes Lasix 60 mg grams twice a day, lisinopril at the skilled nursing facility.  Will resume Lasix and lisinopril.  Will monitor BMP.  Baseline dysphagia We will put the patient on soft diet.  Currently on sliding scale insulin.  Will resume basal insulin.  Nutrition improved.  Continue Glucerna, Ensure, Prosource  DM2 with hyperglycemia.   Continue to monitor closely.  Chronic deconditioning, debility.   Patient is currently at the skilled nursing facility and will need ongoing rehab.  He states that he is able to use a walker at the skilled nursing facility.  Elevated LFTs. Monitor closely.  Disposition.   Patient is from  skilled nursing facility.  Plan is to discharge to skilled nursing facility   DVT prophylaxis: SCDs Start: 09/04/21 0532 enoxaparin (LOVENOX) injection 40 mg Start: 09/03/21 2200   Code Status: Full code  Family Communication: Spoke with the patient at bedside.  Status is: Inpatient  Remains inpatient appropriate because: Recent ICU admission, need for rehabilitation, IV Rocephin.  Consultants: PCCM Wound care.  Procedures: Intubation mechanical ventilation and extubation.    Anti-infectives:  Rocephin IV   Anti-infectives (From admission, onward)    Start     Dose/Rate Route Frequency Ordered Stop   09/05/21 1200  cefTRIAXone (ROCEPHIN) 2 g in sodium chloride 0.9 % 100 mL IVPB        2 g 200 mL/hr over 30 Minutes  Intravenous Every 24 hours 09/05/21 0830 09/16/21 2359   09/04/21 1400  ceFEPIme (MAXIPIME) 2 g in sodium chloride 0.9 % 100 mL IVPB  Status:  Discontinued        2 g 200 mL/hr over 30 Minutes Intravenous Every 8 hours 09/04/21 0924 09/05/21 0830   09/04/21 1200   cefTRIAXone (ROCEPHIN) 2 g in sodium chloride 0.9 % 100 mL IVPB  Status:  Discontinued        2 g 200 mL/hr over 30 Minutes Intravenous Every 24 hours 09/04/21 0741 09/04/21 0917   09/04/21 0600  vancomycin (VANCOCIN) IVPB 1000 mg/200 mL premix  Status:  Discontinued        1,000 mg 200 mL/hr over 60 Minutes Intravenous Every 12 hours 09/03/21 2206 09/04/21 0741   09/04/21 0200  metroNIDAZOLE (FLAGYL) IVPB 500 mg  Status:  Discontinued        500 mg 100 mL/hr over 60 Minutes Intravenous Every 12 hours 09/03/21 2143 09/04/21 0741   09/03/21 2200  ceFEPIme (MAXIPIME) 2 g in sodium chloride 0.9 % 100 mL IVPB  Status:  Discontinued        2 g 200 mL/hr over 30 Minutes Intravenous Every 8 hours 09/03/21 2146 09/04/21 0741   09/03/21 1400  vancomycin (VANCOREADY) IVPB 2000 mg/400 mL        2,000 mg 200 mL/hr over 120 Minutes Intravenous  Once 09/03/21 1328 09/03/21 1815   09/03/21 1330  ceFEPIme (MAXIPIME) 2 g in sodium chloride 0.9 % 100 mL IVPB        2 g 200 mL/hr over 30 Minutes Intravenous  Once 09/03/21 1321 09/03/21 1446   09/03/21 1330  metroNIDAZOLE (FLAGYL) IVPB 500 mg        500 mg 100 mL/hr over 60 Minutes Intravenous  Once 09/03/21 1321 09/03/21 1603   09/03/21 1330  vancomycin (VANCOCIN) IVPB 1000 mg/200 mL premix  Status:  Discontinued        1,000 mg 200 mL/hr over 60 Minutes Intravenous  Once 09/03/21 1321 09/03/21 1328       Subjective: Today, patient was seen and examined at bedside.  Patient denies any nausea vomiting fever or chills.  States that his breathing is okay but has mild cough.  Objective: Vitals:   09/07/21 0900 09/07/21 1000  BP: (!) 177/97 (!) 173/69  Pulse: 85 80  Resp: (!) 6 (!) 24  Temp:    SpO2: 92% 96%    Intake/Output Summary (Last 24 hours) at 09/07/2021 1207 Last data filed at 09/07/2021 0801 Gross per 24 hour  Intake 580 ml  Output 950 ml  Net -370 ml   Filed Weights   09/05/21 0500 09/06/21 0420 09/07/21 0428  Weight: 121.6 kg  123.8 kg 125 kg   Body mass index is 32.68 kg/m.   Physical Exam: GENERAL: Patient is alert awake and oriented. Not in obvious distress.  Obese, nasal cannula oxygen HENT: No scleral pallor or icterus. Pupils equally reactive to light. Oral mucosa is moist NECK: is supple, no gross swelling noted. CHEST: Clear to auscultation. No crackles or wheezes.  Diminished breath sounds bilaterally. CVS: S1 and S2 [place], no murmur. Regular rate and rhythm.  ABDOMEN: Soft, non-tender, bowel sounds are present.  Umbilical hernia. EXTREMITIES: Bilateral lower extremity lymphedema leg ulceration, see pictures below CNS: Cranial nerves are intact. No focal motor deficits. SKIN: warm and dry, bilateral lower extremity lymphedema, leg ulcers on dressing.     Data Review: I have personally reviewed the following  laboratory data and studies,  CBC: Recent Labs  Lab 09/03/21 1321 09/03/21 2158 09/05/21 0241 09/06/21 0236 09/07/21 0237  WBC 14.3* 17.4* 7.7 5.6 6.6  NEUTROABS 13.2*  --   --   --   --   HGB 13.1 11.4* 10.9* 11.5* 10.8*  HCT 46.5 41.2 38.9* 41.0 38.0*  MCV 72.0* 72.5* 72.3* 71.7* 70.2*  PLT 238 212 193 196 914   Basic Metabolic Panel: Recent Labs  Lab 09/03/21 1321 09/03/21 2158 09/04/21 2124 09/05/21 0241 09/05/21 1658 09/06/21 0236 09/07/21 0237  NA 138  --   --  136  --  137 139  K 5.2*  --   --  3.7  --  3.7 3.9  CL 97*  --   --  100  --  103 105  CO2 33*  --   --  27  --  24 26  GLUCOSE 201*  --   --  188*  --  293* 157*  BUN 32*  --   --  27*  --  27* 18  CREATININE 1.17 1.30*  --  1.06  --  0.85 0.86  CALCIUM 9.0  --   --  8.1*  --  8.2* 8.2*  MG  --   --  1.9 1.9 1.9 2.0  --   PHOS  --   --  3.8 3.4 3.0 2.8  --    Liver Function Tests: Recent Labs  Lab 09/03/21 1321 09/05/21 0241 09/06/21 0236 09/07/21 0237  AST 26 82* 72* 54*  ALT 26 62* 80* 67*  ALKPHOS 107 93 118 141*  BILITOT 0.3 0.4 0.3 0.6  PROT 8.2* 6.3* 6.7 6.8  ALBUMIN 3.5 2.5* 2.5* 2.6*    No results for input(s): LIPASE, AMYLASE in the last 168 hours. No results for input(s): AMMONIA in the last 168 hours. Cardiac Enzymes: No results for input(s): CKTOTAL, CKMB, CKMBINDEX, TROPONINI in the last 168 hours. BNP (last 3 results) Recent Labs    06/24/21 2043 09/03/21 2158  BNP 40.1 64.2    ProBNP (last 3 results) No results for input(s): PROBNP in the last 8760 hours.  CBG: Recent Labs  Lab 09/06/21 1601 09/06/21 2032 09/06/21 2340 09/07/21 0328 09/07/21 0755  GLUCAP 132* 202* 165* 141* 132*   Recent Results (from the past 240 hour(s))  Blood Culture (routine x 2)     Status: None (Preliminary result)   Collection Time: 09/03/21  1:26 PM   Specimen: BLOOD LEFT HAND  Result Value Ref Range Status   Specimen Description   Final    BLOOD LEFT HAND Performed at [facility], [place] [address] [place], [place] [address]    Special Requests   Final    BOTTLES DRAWN AEROBIC AND ANAEROBIC Blood Culture results may not be optimal due to an excessive volume of blood received in culture bottles Performed at [facility] [address], [place], [place] [address]    Culture   Final    NO GROWTH 4 DAYS Performed at [facility], [place] [address], [place],  [address]    Report Status PENDING  Incomplete  Resp Panel by RT-PCR (Flu A&B, Covid) Nasopharyngeal Swab     Status: None   Collection Time: 09/03/21  1:51 PM   Specimen: Nasopharyngeal Swab; Nasopharyngeal(NP) swabs in vial transport medium  Result Value Ref Range Status   SARS Coronavirus 2 by RT PCR NEGATIVE NEGATIVE Final    Comment: (NOTE) SARS-CoV-2 target  nucleic acids are NOT DETECTED.  The SARS-CoV-2 RNA is generally detectable in upper respiratory specimens during the acute phase of infection. The lowest concentration of SARS-CoV-2 viral copies this assay can detect is 138 copies/mL. A negative result does not preclude SARS-Cov-2 infection and  should not be used as the sole basis for treatment or other patient management decisions. A negative result may occur with  improper specimen collection/handling, submission of specimen other than nasopharyngeal swab, presence of viral mutation(s) within the areas targeted by this assay, and inadequate number of viral copies(<138 copies/mL). A negative result must be combined with clinical observations, patient history, and epidemiological information. The expected result is Negative.  Fact Sheet for Patients:  EntrepreneurPulse.com.au  Fact Sheet for Healthcare Providers:  IncredibleEmployment.be  This test is no t yet approved or cleared by the [place] FDA and  has been authorized for detection and/or diagnosis of SARS-CoV-2 by FDA under an Emergency Use Authorization (EUA). This EUA will remain  in effect (meaning this test can be used) for the duration of the COVID-19 declaration under Section 564(b)(1) of the Act, 21 U.S.C.section 360bbb-3(b)(1), unless the authorization is terminated  or revoked sooner.       Influenza A by PCR NEGATIVE NEGATIVE Final   Influenza B by PCR NEGATIVE NEGATIVE Final    Comment: (NOTE) The Xpert Xpress SARS-CoV-2/FLU/RSV plus assay is intended as an aid in the diagnosis of influenza from Nasopharyngeal swab specimens and should not be used as a sole basis for treatment. Nasal washings and aspirates are unacceptable for Xpert Xpress SARS-CoV-2/FLU/RSV testing.  Fact Sheet for Patients: EntrepreneurPulse.com.au  Fact Sheet for Healthcare Providers: IncredibleEmployment.be  This test is not yet approved or cleared by the [place] FDA and has been authorized for detection and/or diagnosis of SARS-CoV-2 by FDA under an Emergency Use Authorization (EUA). This EUA will remain in effect (meaning this test can be used) for the duration of the COVID-19 declaration  under Section 564(b)(1) of the Act, 21 U.S.C. section 360bbb-3(b)(1), unless the authorization is terminated or revoked.  Performed at [facility], [place] [address] [place], [place] [address]   Blood Culture (routine x 2)     Status: Abnormal   Collection Time: 09/03/21  2:00 PM   Specimen: BLOOD  Result Value Ref Range Status   Specimen Description   Final    BLOOD RIGHT ANTECUBITAL Performed at [facility] [address], [place], [place] [address]    Special Requests   Final    BOTTLES DRAWN AEROBIC AND ANAEROBIC Blood Culture results may not be optimal due to an excessive volume of blood received in culture bottles Performed at [facility] [address], [place], [place] [address]    Culture  Setup Time   Final    GRAM POSITIVE COCCI IN CHAINS IN BOTH AEROBIC AND ANAEROBIC BOTTLES CRITICAL RESULT CALLED TO, READ BACK BY AND VERIFIED WITH: TGREEN,[email or web address]  09/04/21 [place] Performed at [facility], [place] [address], [place], [place] [address]    Culture STREPTOCOCCUS GROUP G (A)  Final   Report Status 09/06/2021 FINAL  Final   Organism ID, Bacteria STREPTOCOCCUS GROUP G  Final      Susceptibility   Streptococcus group g - MIC*    CLINDAMYCIN >=1 RESISTANT Resistant     AMPICILLIN <=0.25 SENSITIVE Sensitive     ERYTHROMYCIN >=8 RESISTANT Resistant     VANCOMYCIN 0.5 SENSITIVE Sensitive     CEFTRIAXONE <=0.12  LEVOFLOXACIN 0.5 SENSITIVE Sensitive     PENICILLIN Value in next row Sensitive      SENSITIVE0.06    * STREPTOCOCCUS GROUP G  Blood Culture ID Panel (Reflexed)     Status: Abnormal   Collection Time: 09/03/21  2:00 PM  Result Value Ref Range Status   Enterococcus faecalis NOT DETECTED NOT DETECTED Final   Enterococcus Faecium NOT DETECTED NOT DETECTED Final   Listeria monocytogenes NOT DETECTED NOT DETECTED Final   Staphylococcus species NOT DETECTED NOT DETECTED Final   Staphylococcus aureus  (BCID) NOT DETECTED NOT DETECTED Final   Staphylococcus epidermidis NOT DETECTED NOT DETECTED Final   Staphylococcus lugdunensis NOT DETECTED NOT DETECTED Final   Streptococcus species DETECTED (A) NOT DETECTED Final    Comment: Not Enterococcus species, Streptococcus agalactiae, Streptococcus pyogenes, or Streptococcus pneumoniae. CRITICAL RESULT CALLED TO, READ BACK BY AND VERIFIED WITH: T GREEN,PHARMD@0730  09/04/21 [place]    Streptococcus agalactiae NOT DETECTED NOT DETECTED Final   Streptococcus pneumoniae NOT DETECTED NOT DETECTED Final   Streptococcus pyogenes NOT DETECTED NOT DETECTED Final   A.calcoaceticus-baumannii NOT DETECTED NOT DETECTED Final   Bacteroides fragilis NOT DETECTED NOT DETECTED Final   Enterobacterales NOT DETECTED NOT DETECTED Final   Enterobacter cloacae complex NOT DETECTED NOT DETECTED Final   Escherichia coli NOT DETECTED NOT DETECTED Final   Klebsiella aerogenes NOT DETECTED NOT DETECTED Final   Klebsiella oxytoca NOT DETECTED NOT DETECTED Final   Klebsiella pneumoniae NOT DETECTED NOT DETECTED Final   Proteus species NOT DETECTED NOT DETECTED Final   Salmonella species NOT DETECTED NOT DETECTED Final   Serratia marcescens NOT DETECTED NOT DETECTED Final   Haemophilus influenzae NOT DETECTED NOT DETECTED Final   Neisseria meningitidis NOT DETECTED NOT DETECTED Final   Pseudomonas aeruginosa NOT DETECTED NOT DETECTED Final   Stenotrophomonas maltophilia NOT DETECTED NOT DETECTED Final   Candida albicans NOT DETECTED NOT DETECTED Final   Candida auris NOT DETECTED NOT DETECTED Final   Candida glabrata NOT DETECTED NOT DETECTED Final   Candida krusei NOT DETECTED NOT DETECTED Final   Candida parapsilosis NOT DETECTED NOT DETECTED Final   Candida tropicalis NOT DETECTED NOT DETECTED Final   Cryptococcus neoformans/gattii NOT DETECTED NOT DETECTED Final    Comment: Performed at [facility], [address], [place], [place] [address]  Urine Culture      Status: Abnormal   Collection Time: 09/03/21  2:51 PM   Specimen: In/Out Cath Urine  Result Value Ref Range Status   Specimen Description   Final    IN/OUT CATH URINE Performed at [facility] [address] [place], [place] [address]    Special Requests   Final    NONE Performed at [facility], [place] [address], [place], [place] [address]    Culture MULTIPLE SPECIES PRESENT, SUGGEST RECOLLECTION (A)  Final   Report Status 09/04/2021 FINAL  Final  MRSA Next Gen by PCR, Nasal     Status: Abnormal   Collection Time: 09/04/21  8:00 AM   Specimen: Nasal Mucosa; Nasal Swab  Result Value Ref Range Status   MRSA by PCR Next Gen DETECTED (A) NOT DETECTED Final    Comment: RESULT CALLED TO, READ BACK BY AND VERIFIED WITH: [place], J. RN ON 09/04/2021 @ 1225 BY MECIAL J. (NOTE) The GeneXpert MRSA Assay (FDA approved for NASAL specimens only), is one component of a comprehensive MRSA colonization surveillance program. It is not intended to diagnose MRSA infection nor to guide or  monitor treatment for MRSA infections. Test performance is not FDA approved in patients less than 25 years old. Performed at [facility], [place] [address], [place], [place] [address]   Culture, Respiratory w Gram Stain     Status: None   Collection Time: 09/04/21  9:38 AM   Specimen: Tracheal Aspirate; Respiratory  Result Value Ref Range Status   Specimen Description   Final    TRACHEAL ASPIRATE Performed at [facility] [address] [place], [place] [address]    Special Requests   Final    NONE Performed at [facility], [place] [address], [place], [place] [address]    Gram Stain   Final    FEW WBC PRESENT, PREDOMINANTLY PMN RARE GRAM POSITIVE COCCI IN PAIRS    Culture   Final    RARE Normal respiratory flora-no Staph aureus or Pseudomonas seen Performed at [facility] [address], [place],  [place] [address]    Report Status 09/06/2021 FINAL  Final  Culture, blood (Routine X 2) w Reflex to ID Panel     Status: None (Preliminary result)   Collection Time: 09/05/21 10:28 AM   Specimen: BLOOD  Result Value Ref Range Status   Specimen Description   Final    BLOOD BLOOD LEFT WRIST Performed at [facility] [address], [place], [place] [address]    Special Requests   Final    BOTTLES DRAWN AEROBIC AND ANAEROBIC Blood Culture adequate volume Performed at [facility] [address], [place], [place] [address]    Culture   Final    NO GROWTH 2 DAYS Performed at [facility] [address], [place], [place] [address]    Report Status PENDING  Incomplete  Culture, blood (Routine X 2) w Reflex to ID Panel     Status: None (Preliminary result)   Collection Time: 09/05/21 10:29 AM   Specimen: BLOOD  Result Value Ref Range Status   Specimen Description   Final    BLOOD BLOOD RIGHT HAND Performed at [facility] [address], [place], [place] [address]    Special Requests   Final    BOTTLES DRAWN AEROBIC AND ANAEROBIC Blood Culture adequate volume Performed at [facility] [address] [place], [place] [address]    Culture   Final    NO GROWTH 2 DAYS Performed at [facility] [address] [place], [place] [address]    Report Status PENDING  Incomplete     Studies: No results found.    Flora Lipps, MD  [facility] 09/07/2021  If 7PM-7AM, please contact night-coverage

## 2021-09-07 NOTE — Evaluation (Signed)
Physical Therapy Evaluation Patient Details Name: [name] MRN: [id number] DOB: 11/27/1955 Today's Date: 09/07/2021  History of Present Illness  65 year old male with PMH which is significant for HFpEF, HTN, OSA, DM, chronic hypoxemic respiratory failure on 2L, and previous cellulitis of the lower extremity. Admitted now for sepsis2* L LE wound, AMS, and acute on chronic respiratory failure.  Clinical Impression  [name] is a 65 year old man who presents  with generalized weakness, decreased activity tolerance, impaired balance, decreased ROM in LLE and pain resulting in a sudden decline in functional abilities. Patient reports he is typically able to transfer himself out of the bed. He is able to stand with walker from high bed height and ambulate to wc outside of his room. He at times uses walker, pushes wc or maneuvers in wc at facility.  Currently patient is max x 2 for bed transfers, unable to stand, and required hoyer to the chair.  Patient will benefit from skilled PT services while in hospital to improve deficits and regain PLOF.  Return to facility at discharge     Recommendations for follow up therapy are one component of a multi-disciplinary discharge planning process, led by the attending physician.  Recommendations may be updated based on patient status, additional functional criteria and insurance authorization.  Follow Up Recommendations Skilled nursing-short term rehab (<3 hours/day)    Assistance Recommended at Discharge Frequent or constant Supervision/Assistance  Functional Status Assessment Patient has had a recent decline in their functional status and demonstrates the ability to make significant improvements in function in a reasonable and predictable amount of time.  Equipment Recommendations  None recommended by PT    Recommendations for Other Services       Precautions / Restrictions Precautions Precautions: Fall Precaution Comments: Limited L hip and  knee flex Restrictions Weight Bearing Restrictions: No      Mobility  Bed Mobility Overal bed mobility: Needs Assistance Bed Mobility: Supine to Sit;Sit to Supine     Supine to sit: Max assist;+2 for safety/equipment;HOB elevated Sit to supine: Max assist;+2 for safety/equipment   General bed mobility comments: Use of bed rails with assist to manage LEs and to control trunk    Transfers Overall transfer level: Needs assistance                 General transfer comment: Attempted to initiate stand with stedy and very high elevated bed but patient unable to lean forward enough and left extended knee could not be bent enough to use stedy. Patient returned to supine and then lifted in to chair.    Ambulation/Gait               General Gait Details: NA  Stairs            Wheelchair Mobility    Modified Rankin (Stroke Patients Only)       Balance Overall balance assessment: Needs assistance Sitting-balance support: Single extremity supported Sitting balance-Leahy Scale: Poor Sitting balance - Comments: Requires external assist or propping on RUE                                     Pertinent Vitals/Pain Pain Assessment: Faces Faces Pain Scale: Hurts little more Pain Location: LLE Pain Intervention(s): Limited activity within patient's tolerance;Monitored during session;Repositioned    Home Living Family/patient expects to be discharged to:: Skilled nursing facility  Additional Comments: [name] resident    Prior Function Prior Level of Function : Needs assist             Mobility Comments: reports he walks with a walker some or pushes a wc at times, also gets around in wc, reports he can typically get out of bed unassisted using bed to assist in standing.  To stand from wc, pt states he pushes with one hand and pulls on door frame with other ADLs Comments: reports assistance with socks and shoes, has  some assistance with other self care tasks, reports getting to the toilet on his own using wc to get the bathroom     Hand Dominance   Dominant Hand: Right    Extremity/Trunk Assessment   Upper Extremity Assessment Upper Extremity Assessment: Generalized weakness    Lower Extremity Assessment Lower Extremity Assessment: Generalized weakness;LLE deficits/detail LLE Deficits / Details: Hip flex limited to ~ 75-80 with pt leaning back in sitting to compensate.  Minimal flex at L knee       Communication   Communication: No difficulties  Cognition Arousal/Alertness: Awake/alert Behavior During Therapy: WFL for tasks assessed/performed Overall Cognitive Status: Within Functional Limits for tasks assessed                                          General Comments      Exercises     Assessment/Plan    PT Assessment Patient needs continued PT services  PT Problem List Decreased strength;Decreased range of motion;Decreased activity tolerance;Decreased balance;Decreased mobility;Decreased knowledge of use of DME;Pain;Obesity       PT Treatment Interventions DME instruction;Gait training;Functional mobility training;Therapeutic activities;Therapeutic exercise;Balance training;Patient/family education    PT Goals (Current goals can be found in the Care Plan section)  Acute Rehab PT Goals Patient Stated Goal: Regain ability to ambulate PT Goal Formulation: With patient Time For Goal Achievement: 09/07/21 Potential to Achieve Goals: Fair    Frequency Min 3X/week   Barriers to discharge        Co-evaluation PT/OT/SLP Co-Evaluation/Treatment: Yes Reason for Co-Treatment: For patient/therapist safety PT goals addressed during session: Mobility/safety with mobility OT goals addressed during session: ADL's and self-care       AM-PAC PT "6 Clicks" Mobility  Outcome Measure Help needed turning from your back to your side while in a flat bed without using  bedrails?: A Lot Help needed moving from lying on your back to sitting on the side of a flat bed without using bedrails?: A Lot Help needed moving to and from a bed to a chair (including a wheelchair)?: Total Help needed standing up from a chair using your arms (e.g., wheelchair or bedside chair)?: Total Help needed to walk in hospital room?: Total Help needed climbing 3-5 steps with a railing? : Total 6 Click Score: 8    End of Session Equipment Utilized During Treatment: Other (comment) (maxi sky lift) Activity Tolerance: Patient tolerated treatment well Patient left: in chair;with call bell/phone within reach;with chair alarm set Nurse Communication: Mobility status;Need for lift equipment PT Visit Diagnosis: Muscle weakness (generalized) (M62.81);Difficulty in walking, not elsewhere classified (R26.2);Pain Pain - Right/Left: Left Pain - part of body: Leg    Time: 1829-9371 PT Time Calculation (min) (ACUTE ONLY): 40 min   Charges:   PT Evaluation $PT Eval Moderate Complexity: 1 Mod  Debe Coder PT Acute Rehabilitation Services Pager [phone] Office [phone]   Nikolina Simerson 09/07/2021, 2:17 PM

## 2021-09-07 NOTE — Evaluation (Signed)
Occupational Therapy Evaluation Patient Details Name: [name] MRN: [id number] DOB: 07-30-1956 Today's Date: 09/07/2021   History of Present Illness 65 year old male with PMH as below, which is significant for HFpEF, HTN, OSA, DM, chronic hypoxemic respiratory failure on 2L, and previous cellulitis of the lower extremity. Admitted now for sepsis.   Clinical Impression   [name] is a 65 year old man who presents  with generalized weakness, decreased activity tolerance, impaired balance, decreased ROM in LLE and pain resulting in a sudden decline in functional abilities. Patient reports he is typically able to transfer himself out of the bed. He is able to stand with walker from high bed height and ambulate to wc outside of his room. He at times uses walker, pushes wc or maneuvers in wc at facility. He reports he has some assistance with ADLs but is able to do a lot of it and is able to go to the bathroom using wc. Currently patient is max x 2 for bed transfers, unable to stand, and required hoyer to the chair. He requires significantly more assist for ADLs at this time. Patient will benefit from skilled OT services while in hospital to improve deficits and learn compensatory strategies as needed in order to return to PLOF.  Return to facility at discharge.     Recommendations for follow up therapy are one component of a multi-disciplinary discharge planning process, led by the attending physician.  Recommendations may be updated based on patient status, additional functional criteria and insurance authorization.   Follow Up Recommendations  Skilled nursing-short term rehab (<3 hours/day)    Assistance Recommended at Discharge Frequent or constant Supervision/Assistance  Functional Status Assessment  Patient has had a recent decline in their functional status and demonstrates the ability to make significant improvements in function in a reasonable and predictable amount of time.   Equipment Recommendations       Recommendations for Other Services       Precautions / Restrictions Precautions Precautions: Fall Precaution Comments: left knee doesn't bend much Restrictions Weight Bearing Restrictions: No      Mobility Bed Mobility Overal bed mobility: Needs Assistance Bed Mobility: Supine to Sit;Sit to Supine     Supine to sit: Max assist;+2 for safety/equipment;HOB elevated Sit to supine: Max assist;+2 for safety/equipment        Transfers Overall transfer level: Needs assistance                 General transfer comment: Attempted to initiate stand with stedy and very high elevated bed but patient unable to lean forward enough and left extended knee could not be bent enough to use stedy. Patient returned to supine and then lifted in to chair.      Balance Overall balance assessment: Needs assistance Sitting-balance support: Single extremity supported Sitting balance-Leahy Scale: Poor Sitting balance - Comments: Requires external assist or propping on RUE                                   ADL either performed or assessed with clinical judgement   ADL Overall ADL's : Needs assistance/impaired Eating/Feeding: Set up;Sitting   Grooming: Set up;Sitting   Upper Body Bathing: Minimal assistance;Sitting   Lower Body Bathing: Maximal assistance;Sitting/lateral leans   Upper Body Dressing : Moderate assistance;Sitting   Lower Body Dressing: Total assistance;Bed level;+2 for physical assistance   Toilet Transfer: Total assistance;+2 for physical assistance;+2  for safety/equipment   Toileting- Clothing Manipulation and Hygiene: Total assistance;Bed level       Functional mobility during ADLs: Total assistance;+2 for physical assistance       Vision Patient Visual Report: No change from baseline       Perception     Praxis      Pertinent Vitals/Pain Pain Assessment: Faces Faces Pain Scale: Hurts little  more Pain Location: LLE Pain Intervention(s): Limited activity within patient's tolerance;Monitored during session     Hand Dominance Right   Extremity/Trunk Assessment Upper Extremity Assessment Upper Extremity Assessment: Generalized weakness   Lower Extremity Assessment Lower Extremity Assessment: Defer to PT evaluation       Communication Communication Communication: No difficulties   Cognition Arousal/Alertness: Awake/alert Behavior During Therapy: WFL for tasks assessed/performed Overall Cognitive Status: Within Functional Limits for tasks assessed                                       General Comments       Exercises     Shoulder Instructions      Home Living Family/patient expects to be discharged to:: Skilled nursing facility                                 Additional Comments: [name] resident      Prior Functioning/Environment Prior Level of Function : Needs assist             Mobility Comments: reports he walks with a walker som or pushes a wc at times, also gets around in wc, reports he can typically get out of bed ADLs Comments: reports assistance with socks and shoes, has some assistance with other self care tasks, reports getting to the toilet on his own using wc to get the bathroom        OT Problem List: Decreased strength;Decreased range of motion;Decreased activity tolerance;Impaired balance (sitting and/or standing);Pain;Obesity      OT Treatment/Interventions: Self-care/ADL training;Therapeutic exercise;DME and/or AE instruction;Therapeutic activities;Balance training;Patient/family education    OT Goals(Current goals can be found in the care plan section) Acute Rehab OT Goals Patient Stated Goal: To stand and get to chiar OT Goal Formulation: With patient Time For Goal Achievement: 09/21/21 Potential to Achieve Goals: Good  OT Frequency: Min 2X/week   Barriers to D/C:             Co-evaluation              AM-PAC OT "6 Clicks" Daily Activity     Outcome Measure Help from another person eating meals?: A Little Help from another person taking care of personal grooming?: A Little Help from another person toileting, which includes using toliet, bedpan, or urinal?: Total Help from another person bathing (including washing, rinsing, drying)?: A Lot Help from another person to put on and taking off regular upper body clothing?: A Lot Help from another person to put on and taking off regular lower body clothing?: Total 6 Click Score: 12   End of Session Equipment Utilized During Treatment: Other (comment) Product manager) Nurse Communication: Mobility status  Activity Tolerance:   Patient left: in chair;with call bell/phone within reach  OT Visit Diagnosis: Muscle weakness (generalized) (M62.81);Pain                Time: 5277-8242 OT Time Calculation (min): 39 min  Charges:  OT General Charges $OT Visit: 1 Visit OT Evaluation $OT Eval Moderate Complexity: 1 Mod  Canaan Holzer, OTR/L [place]  Office [phone] Pager: [place] 09/07/2021, 12:01 PM

## 2021-09-07 NOTE — Progress Notes (Signed)
Flexiseal discontinued. Patient now tolerating soft diet and no longer having loose stool

## 2021-09-08 LAB — CBC
HCT: 38.4 % — ABNORMAL LOW (ref 39.0–52.0)
Hemoglobin: 10.9 g/dL — ABNORMAL LOW (ref 13.0–17.0)
MCH: 19.9 pg — ABNORMAL LOW (ref 26.0–34.0)
MCHC: 28.4 g/dL — ABNORMAL LOW (ref 30.0–36.0)
MCV: 70.1 fL — ABNORMAL LOW (ref 80.0–100.0)
Platelets: 212 10*3/uL (ref 150–400)
RBC: 5.48 MIL/uL (ref 4.22–5.81)
RDW: 20 % — ABNORMAL HIGH (ref 11.5–15.5)
WBC: 5.8 10*3/uL (ref 4.0–10.5)
nRBC: 0 % (ref 0.0–0.2)

## 2021-09-08 LAB — COMPREHENSIVE METABOLIC PANEL
ALT: 68 U/L — ABNORMAL HIGH (ref 0–44)
AST: 53 U/L — ABNORMAL HIGH (ref 15–41)
Albumin: 2.6 g/dL — ABNORMAL LOW (ref 3.5–5.0)
Alkaline Phosphatase: 129 U/L — ABNORMAL HIGH (ref 38–126)
Anion gap: 9 (ref 5–15)
BUN: 17 mg/dL (ref 8–23)
CO2: 29 mmol/L (ref 22–32)
Calcium: 8.4 mg/dL — ABNORMAL LOW (ref 8.9–10.3)
Chloride: 101 mmol/L (ref 98–111)
Creatinine, Ser: 0.86 mg/dL (ref 0.61–1.24)
GFR, Estimated: >60 mL/min (ref >60)
Glucose, Bld: 128 mg/dL — ABNORMAL HIGH (ref 70–99)
Potassium: 4 mmol/L (ref 3.5–5.1)
Sodium: 139 mmol/L (ref 135–145)
Total Bilirubin: 0.4 mg/dL (ref 0.3–1.2)
Total Protein: 6.9 g/dL (ref 6.5–8.1)

## 2021-09-08 LAB — CULTURE, BLOOD (ROUTINE X 2): Culture: NO GROWTH

## 2021-09-08 LAB — GLUCOSE, CAPILLARY
Glucose-Capillary: 149 mg/dL — ABNORMAL HIGH (ref 70–99)
Glucose-Capillary: 149 mg/dL — ABNORMAL HIGH (ref 70–99)
Glucose-Capillary: 277 mg/dL — ABNORMAL HIGH (ref 70–99)
Glucose-Capillary: 225 mg/dL — ABNORMAL HIGH (ref 70–99)
Glucose-Capillary: 270 mg/dL — ABNORMAL HIGH (ref 70–99)

## 2021-09-08 LAB — MAGNESIUM: Magnesium: 1.8 mg/dL (ref 1.7–2.4)

## 2021-09-08 MED ORDER — INSULIN GLARGINE-YFGN 100 UNIT/ML ~~LOC~~ SOLN
15.0000 [IU] | Freq: Every day | SUBCUTANEOUS | Status: DC
  Administered 2021-09-08 – 2021-09-09 (×2): 15 [IU] via SUBCUTANEOUS
  Filled 2021-09-08 (×2): qty 0.15

## 2021-09-08 MED ORDER — INSULIN ASPART 100 UNIT/ML IJ SOLN
0.0000 [IU] | Freq: Three times a day (TID) | INTRAMUSCULAR | Status: DC
Start: 2021-09-08 — End: 2021-09-11
  Administered 2021-09-08 (×2): 8 [IU] via SUBCUTANEOUS
  Administered 2021-09-09: 17:00:00 5 [IU] via SUBCUTANEOUS
  Administered 2021-09-09 – 2021-09-10 (×3): 8 [IU] via SUBCUTANEOUS
  Administered 2021-09-10: 12:00:00 15 [IU] via SUBCUTANEOUS
  Administered 2021-09-10: 17:00:00 5 [IU] via SUBCUTANEOUS

## 2021-09-08 MED ORDER — INSULIN ASPART 100 UNIT/ML IJ SOLN
3.0000 [IU] | Freq: Three times a day (TID) | INTRAMUSCULAR | Status: DC
  Administered 2021-09-08 – 2021-09-09 (×6): 3 [IU] via SUBCUTANEOUS

## 2021-09-08 MED ORDER — INSULIN ASPART 100 UNIT/ML IJ SOLN
0.0000 [IU] | Freq: Every day | INTRAMUSCULAR | Status: DC
  Administered 2021-09-08 – 2021-09-09 (×2): 2 [IU] via SUBCUTANEOUS
  Administered 2021-09-10: 22:00:00 3 [IU] via SUBCUTANEOUS

## 2021-09-08 MED ORDER — VILAZODONE HCL 10 MG PO TABS
40.0000 mg | ORAL_TABLET | Freq: Every day | ORAL | Status: DC
  Administered 2021-09-08 – 2021-09-10 (×3): 40 mg via ORAL
  Filled 2021-09-08 (×3): qty 4

## 2021-09-08 NOTE — Plan of Care (Signed)

## 2021-09-08 NOTE — Progress Notes (Signed)
PROGRESS NOTE  [name] HYW:[id number] DOB: 12/28/1955 DOA: 09/03/2021 PCP: [name], MD   LOS: 5 days   Brief narrative:  65 year old male with past medical history of heart failure with preserved ejection fraction, hypertension, obstructive sleep apnea, diabetes mellitus, chronic hypoxic respiratory failure on 2 L of oxygen and history of previous cellulitis of the lower extremity presented to hospital from skilled nursing facility with fever, chills and bilateral lower extremity wounds with malodorous discharge. Patient was initially admitted to ICU because he was somnolent and unarousable with severe respiratory acidosis on ABG. Patient was intubated by the ED physician and PCCM was asked to transfer the the ED and was admitted ICU for further management.  Subsequently, patient was considered stable for transfer out of the ICU.  Assessment/Plan:  Principal Problem:   Severe sepsis (HCC) Active Problems:   Cellulitis of left lower extremity   DM2 (diabetes mellitus, type 2) (HCC)   Nonhealing ulcer of left lower extremity (HCC)   Stasis edema of both lower extremities   Chronic respiratory failure with hypoxia (HCC)   Acute on chronic respiratory failure with hypoxia and hypercapnia (HCC)   Chronic diastolic CHF (congestive heart failure) (HCC)   Aortic atherosclerosis (HCC)   Acute respiratory acidosis (HCC)   Acute metabolic encephalopathy   Septic shock (HCC)   Sepsis (HCC)  Septic shock secondary to group B streptococcus from chronic leg wounds and cellulitis, right lower lobe pneumonia.   Improved at this time.  Septic shock has resolved at this time.  Continue IV Rocephin.  Continue wound care.  Wound care on board.    Acute on chronic hypoxemic respiratory failure Likely multifactorial from sepsis right lower lobe pneumonia and heart failure with preserved ejection fraction on the background of sleep apnea.  Patient does have history of chronic hypoxic  respiratory failure at baseline.  Chest x-ray with right-sided volume loss.Currently at 2 L/min.  At baseline.  chronic diastolic heart failure.  Diuretics have been resumed.  Check BMP closely.  Creatinine at 0.8 today.  Intake and output charting.  Daily weights.  Negative balance 797 mL  HFpEF with OSA/OHS intolerant to CPAP Continue supplemental oxygen.  Resumed Lasix and lisinopril from 09/07/21.  Will monitor BMP.  Baseline dysphagia continue soft diet..  Nutrition improved.  Continue Glucerna, Ensure, Prosource  DM2 with hyperglycemia.   Continue to monitor closely.  We will change patient's insulin to basal bolus.  Patient takes 30 units twice daily at home, will start with 15 units long-acting at night continue with mealtime and sliding scale insulin.  Closely monitor.  Chronic deconditioning, debility.   Patient is currently at the skilled nursing facility and will need ongoing rehab.  He states that he is able to use a walker at the skilled nursing facility.  Elevated LFTs. Monitor closely.  Mildly elevated but stable.  Disposition.   Patient is from skilled nursing facility.  Plan is to discharge to skilled nursing facility   DVT prophylaxis: SCDs Start: 09/04/21 0532 enoxaparin (LOVENOX) injection 40 mg Start: 09/03/21 2200 to skilled nursing facility.  Code Status: Full code  Family Communication:  Spoke with the patient at bedside.  Status is: Inpatient  Remains inpatient appropriate because: Recent ICU admission, need for rehabilitation, IV Rocephin.  Consultants: PCCM Wound care.  Procedures: Intubation mechanical ventilation and extubation.    Anti-infectives:  Rocephin IV   Anti-infectives (From admission, onward)    Start     Dose/Rate Route Frequency Ordered Stop  09/05/21 1200  cefTRIAXone (ROCEPHIN) 2 g in sodium chloride 0.9 % 100 mL IVPB        2 g 200 mL/hr over 30 Minutes Intravenous Every 24 hours 09/05/21 0830 09/12/21 1159   09/04/21  1400  ceFEPIme (MAXIPIME) 2 g in sodium chloride 0.9 % 100 mL IVPB  Status:  Discontinued        2 g 200 mL/hr over 30 Minutes Intravenous Every 8 hours 09/04/21 0924 09/05/21 0830   09/04/21 1200  cefTRIAXone (ROCEPHIN) 2 g in sodium chloride 0.9 % 100 mL IVPB  Status:  Discontinued        2 g 200 mL/hr over 30 Minutes Intravenous Every 24 hours 09/04/21 0741 09/04/21 0917   09/04/21 0600  vancomycin (VANCOCIN) IVPB 1000 mg/200 mL premix  Status:  Discontinued        1,000 mg 200 mL/hr over 60 Minutes Intravenous Every 12 hours 09/03/21 2206 09/04/21 0741   09/04/21 0200  metroNIDAZOLE (FLAGYL) IVPB 500 mg  Status:  Discontinued        500 mg 100 mL/hr over 60 Minutes Intravenous Every 12 hours 09/03/21 2143 09/04/21 0741   09/03/21 2200  ceFEPIme (MAXIPIME) 2 g in sodium chloride 0.9 % 100 mL IVPB  Status:  Discontinued        2 g 200 mL/hr over 30 Minutes Intravenous Every 8 hours 09/03/21 2146 09/04/21 0741   09/03/21 1400  vancomycin (VANCOREADY) IVPB 2000 mg/400 mL        2,000 mg 200 mL/hr over 120 Minutes Intravenous  Once 09/03/21 1328 09/03/21 1815   09/03/21 1330  ceFEPIme (MAXIPIME) 2 g in sodium chloride 0.9 % 100 mL IVPB        2 g 200 mL/hr over 30 Minutes Intravenous  Once 09/03/21 1321 09/03/21 1446   09/03/21 1330  metroNIDAZOLE (FLAGYL) IVPB 500 mg        500 mg 100 mL/hr over 60 Minutes Intravenous  Once 09/03/21 1321 09/03/21 1603   09/03/21 1330  vancomycin (VANCOCIN) IVPB 1000 mg/200 mL premix  Status:  Discontinued        1,000 mg 200 mL/hr over 60 Minutes Intravenous  Once 09/03/21 1321 09/03/21 1328      Subjective: Today, patient was seen and examined at bedside.  Patient denies any nausea vomiting or fever.  Denies any chest pain.  States that he could not sleep that well.  Objective: Vitals:   09/08/21 0400 09/08/21 0600  BP: (!) 153/58 133/73  Pulse:    Resp: (!) 22 20  Temp: 98.5 F (36.9 C)   SpO2: 97% 95%    Intake/Output Summary (Last 24  hours) at 09/08/2021 0729 Last data filed at 09/08/2021 0509 Gross per 24 hour  Intake 582.91 ml  Output 2650 ml  Net -2067.09 ml    Filed Weights   09/06/21 0420 09/07/21 0428 09/08/21 0405  Weight: 123.8 kg 125 kg 125.4 kg   Body mass index is 32.78 kg/m.   Physical Exam:  GENERAL: Patient is alert awake and oriented. Not in obvious distress.  Obese, on nasal cannula oxygen.   HENT: No scleral pallor or icterus. Pupils equally reactive to light. Oral mucosa is moist NECK: is supple, no gross swelling noted. CHEST:   Diminished breath sounds bilaterally. CVS: S1 and S2 Aime, no murmur. Regular rate and rhythm.  ABDOMEN: Soft, non-tender, bowel sounds are present.  Umbilical hernia in place with EXTREMITIES bilateral lower extremity ulcerations and edema. CNS: Cranial nerves  are intact. No focal motor deficits. SKIN: warm and dry, bilateral lower extremity lymphedema, leg ulcers as in the picture below on dressing.     Data Review: I have personally reviewed the following laboratory data and studies,  CBC: Recent Labs  Lab 09/03/21 1321 09/03/21 2158 09/05/21 0241 09/06/21 0236 09/07/21 0237 09/08/21 0259  WBC 14.3* 17.4* 7.7 5.6 6.6 5.8  NEUTROABS 13.2*  --   --   --   --   --   HGB 13.1 11.4* 10.9* 11.5* 10.8* 10.9*  HCT 46.5 41.2 38.9* 41.0 38.0* 38.4*  MCV 72.0* 72.5* 72.3* 71.7* 70.2* 70.1*  PLT 238 212 193 196 205 476    Basic Metabolic Panel: Recent Labs  Lab 09/03/21 1321 09/03/21 2158 09/04/21 2124 09/05/21 0241 09/05/21 1658 09/06/21 0236 09/07/21 0237 09/08/21 0259  NA 138  --   --  136  --  137 139 139  K 5.2*  --   --  3.7  --  3.7 3.9 4.0  CL 97*  --   --  100  --  103 105 101  CO2 33*  --   --  27  --  24 26 29   GLUCOSE 201*  --   --  188*  --  293* 157* 128*  BUN 32*  --   --  27*  --  27* 18 17  CREATININE 1.17 1.30*  --  1.06  --  0.85 0.86 0.86  CALCIUM 9.0  --   --  8.1*  --  8.2* 8.2* 8.4*  MG  --   --  1.9 1.9 1.9 2.0  --  1.8   PHOS  --   --  3.8 3.4 3.0 2.8  --   --     Liver Function Tests: Recent Labs  Lab 09/03/21 1321 09/05/21 0241 09/06/21 0236 09/07/21 0237 09/08/21 0259  AST 26 82* 72* 54* 53*  ALT 26 62* 80* 67* 68*  ALKPHOS 107 93 118 141* 129*  BILITOT 0.3 0.4 0.3 0.6 0.4  PROT 8.2* 6.3* 6.7 6.8 6.9  ALBUMIN 3.5 2.5* 2.5* 2.6* 2.6*    No results for input(s): LIPASE, AMYLASE in the last 168 hours. No results for input(s): AMMONIA in the last 168 hours. Cardiac Enzymes: No results for input(s): CKTOTAL, CKMB, CKMBINDEX, TROPONINI in the last 168 hours. BNP (last 3 results) Recent Labs    06/24/21 2043 09/03/21 2158  BNP 40.1 64.2     ProBNP (last 3 results) No results for input(s): PROBNP in the last 8760 hours.  CBG: Recent Labs  Lab 09/07/21 1208 09/07/21 1635 09/07/21 1921 09/07/21 2317 09/08/21 0507  GLUCAP 136* 146* 140* 144* 149*    Recent Results (from the past 240 hour(s))  Blood Culture (routine x 2)     Status: None (Preliminary result)   Collection Time: 09/03/21  1:26 PM   Specimen: BLOOD LEFT HAND  Result Value Ref Range Status   Specimen Description   Final    BLOOD LEFT HAND Performed at [facility], [employer] [address] [place], [place] [address]    Special Requests   Final    BOTTLES DRAWN AEROBIC AND ANAEROBIC Blood Culture results may not be optimal due to an excessive volume of blood received in culture bottles Performed at [facility] [address] [place], [place] [address]    Culture   Final    NO GROWTH 4 DAYS Performed at [facility], [place]  [address], [place], [place] [address]    Report Status PENDING  Incomplete  Resp Panel by RT-PCR (Flu A&B, Covid) Nasopharyngeal Swab     Status: None   Collection Time: 09/03/21  1:51 PM   Specimen: Nasopharyngeal Swab; Nasopharyngeal(NP) swabs in vial transport medium  Result Value Ref Range Status   SARS Coronavirus 2 by RT PCR NEGATIVE NEGATIVE Final     Comment: (NOTE) SARS-CoV-2 target nucleic acids are NOT DETECTED.  The SARS-CoV-2 RNA is generally detectable in upper respiratory specimens during the acute phase of infection. The lowest concentration of SARS-CoV-2 viral copies this assay can detect is 138 copies/mL. A negative result does not preclude SARS-Cov-2 infection and should not be used as the sole basis for treatment or other patient management decisions. A negative result may occur with  improper specimen collection/handling, submission of specimen other than nasopharyngeal swab, presence of viral mutation(s) within the areas targeted by this assay, and inadequate number of viral copies(<138 copies/mL). A negative result must be combined with clinical observations, patient history, and epidemiological information. The expected result is Negative.  Fact Sheet for Patients:  EntrepreneurPulse.com.au  Fact Sheet for Healthcare Providers:  IncredibleEmployment.be  This test is no t yet approved or cleared by the [place] FDA and  has been authorized for detection and/or diagnosis of SARS-CoV-2 by FDA under an Emergency Use Authorization (EUA). This EUA will remain  in effect (meaning this test can be used) for the duration of the COVID-19 declaration under Section 564(b)(1) of the Act, 21 U.S.C.section 360bbb-3(b)(1), unless the authorization is terminated  or revoked sooner.       Influenza A by PCR NEGATIVE NEGATIVE Final   Influenza B by PCR NEGATIVE NEGATIVE Final    Comment: (NOTE) The Xpert Xpress SARS-CoV-2/FLU/RSV plus assay is intended as an aid in the diagnosis of influenza from Nasopharyngeal swab specimens and should not be used as a sole basis for treatment. Nasal washings and aspirates are unacceptable for Xpert Xpress SARS-CoV-2/FLU/RSV testing.  Fact Sheet for Patients: EntrepreneurPulse.com.au  Fact Sheet for Healthcare  Providers: IncredibleEmployment.be  This test is not yet approved or cleared by the [place] FDA and has been authorized for detection and/or diagnosis of SARS-CoV-2 by FDA under an Emergency Use Authorization (EUA). This EUA will remain in effect (meaning this test can be used) for the duration of the COVID-19 declaration under Section 564(b)(1) of the Act, 21 U.S.C. section 360bbb-3(b)(1), unless the authorization is terminated or revoked.  Performed at [facility], [place] [address] [place], [place] [address]   Blood Culture (routine x 2)     Status: Abnormal   Collection Time: 09/03/21  2:00 PM   Specimen: BLOOD  Result Value Ref Range Status   Specimen Description   Final    BLOOD RIGHT ANTECUBITAL Performed at [facility] [address], [place], [place] [address]    Special Requests   Final    BOTTLES DRAWN AEROBIC AND ANAEROBIC Blood Culture results may not be optimal due to an excessive volume of blood received in culture bottles Performed at [facility] [address] [place], [place] [address]    Culture  Setup Time   Final    GRAM POSITIVE COCCI IN CHAINS IN BOTH AEROBIC AND ANAEROBIC BOTTLES CRITICAL RESULT CALLED TO, READ BACK BY AND VERIFIED WITH: [email or web address]  09/04/21 [place] Performed at [facility], [place] [address], [place], [place] [address]    Culture STREPTOCOCCUS GROUP G (  A)  Final   Report Status 09/06/2021 FINAL  Final   Organism ID, Bacteria STREPTOCOCCUS GROUP G  Final      Susceptibility   Streptococcus group g - MIC*    CLINDAMYCIN >=1 RESISTANT Resistant     AMPICILLIN <=0.25 SENSITIVE Sensitive     ERYTHROMYCIN >=8 RESISTANT Resistant     VANCOMYCIN 0.5 SENSITIVE Sensitive     CEFTRIAXONE <=0.12      LEVOFLOXACIN 0.5 SENSITIVE Sensitive     PENICILLIN Value in next row Sensitive      SENSITIVE0.06    * STREPTOCOCCUS GROUP G  Blood Culture ID Panel  (Reflexed)     Status: Abnormal   Collection Time: 09/03/21  2:00 PM  Result Value Ref Range Status   Enterococcus faecalis NOT DETECTED NOT DETECTED Final   Enterococcus Faecium NOT DETECTED NOT DETECTED Final   Listeria monocytogenes NOT DETECTED NOT DETECTED Final   Staphylococcus species NOT DETECTED NOT DETECTED Final   Staphylococcus aureus (BCID) NOT DETECTED NOT DETECTED Final   Staphylococcus epidermidis NOT DETECTED NOT DETECTED Final   Staphylococcus lugdunensis NOT DETECTED NOT DETECTED Final   Streptococcus species DETECTED (A) NOT DETECTED Final    Comment: Not Enterococcus species, Streptococcus agalactiae, Streptococcus pyogenes, or Streptococcus pneumoniae. CRITICAL RESULT CALLED TO, READ BACK BY AND VERIFIED WITH: T GREEN,[email or web address]  09/04/21 [place]    Streptococcus agalactiae NOT DETECTED NOT DETECTED Final   Streptococcus pneumoniae NOT DETECTED NOT DETECTED Final   Streptococcus pyogenes NOT DETECTED NOT DETECTED Final   A.calcoaceticus-baumannii NOT DETECTED NOT DETECTED Final   Bacteroides fragilis NOT DETECTED NOT DETECTED Final   Enterobacterales NOT DETECTED NOT DETECTED Final   Enterobacter cloacae complex NOT DETECTED NOT DETECTED Final   Escherichia coli NOT DETECTED NOT DETECTED Final   Klebsiella aerogenes NOT DETECTED NOT DETECTED Final   Klebsiella oxytoca NOT DETECTED NOT DETECTED Final   Klebsiella pneumoniae NOT DETECTED NOT DETECTED Final   Proteus species NOT DETECTED NOT DETECTED Final   Salmonella species NOT DETECTED NOT DETECTED Final   Serratia marcescens NOT DETECTED NOT DETECTED Final   Haemophilus influenzae NOT DETECTED NOT DETECTED Final   Neisseria meningitidis NOT DETECTED NOT DETECTED Final   Pseudomonas aeruginosa NOT DETECTED NOT DETECTED Final   Stenotrophomonas maltophilia NOT DETECTED NOT DETECTED Final   Candida albicans NOT DETECTED NOT DETECTED Final   Candida auris NOT DETECTED NOT DETECTED Final   Candida glabrata NOT  DETECTED NOT DETECTED Final   Candida krusei NOT DETECTED NOT DETECTED Final   Candida parapsilosis NOT DETECTED NOT DETECTED Final   Candida tropicalis NOT DETECTED NOT DETECTED Final   Cryptococcus neoformans/gattii NOT DETECTED NOT DETECTED Final    Comment: Performed at [facility], [address], [place], [place] [address]  Urine Culture     Status: Abnormal   Collection Time: 09/03/21  2:51 PM   Specimen: In/Out Cath Urine  Result Value Ref Range Status   Specimen Description   Final    IN/OUT CATH URINE Performed at [facility] [address] [place], [place] [address]    Special Requests   Final    NONE Performed at [facility], [place] [address] [place], [place] [address]    Culture MULTIPLE SPECIES PRESENT, SUGGEST RECOLLECTION (A)  Final   Report Status 09/04/2021 FINAL  Final  MRSA Next Gen by PCR, Nasal     Status: Abnormal   Collection Time: 09/04/21  8:00 AM   Specimen: Nasal Mucosa;  Nasal Swab  Result Value Ref Range Status   MRSA by PCR Next Gen DETECTED (A) NOT DETECTED Final    Comment: RESULT CALLED TO, READ BACK BY AND VERIFIED WITH: Ste[place], J. RN ON 09/04/2021 @ 1225 BY MECIAL J. (NOTE) The GeneXpert MRSA Assay (FDA approved for NASAL specimens only), is one component of a comprehensive MRSA colonization surveillance program. It is not intended to diagnose MRSA infection nor to guide or monitor treatment for MRSA infections. Test performance is not FDA approved in patients less than 33 years old. Performed at [facility], [place] [address], [place], [place] [address]   Culture, Respiratory w Gram Stain     Status: None   Collection Time: 09/04/21  9:38 AM   Specimen: Tracheal Aspirate; Respiratory  Result Value Ref Range Status   Specimen Description   Final    TRACHEAL ASPIRATE Performed at [facility] [address] [place], [place] [address]    Special Requests    Final    NONE Performed at [facility], [place] [address] [place], [place] [address]    Gram Stain   Final    FEW WBC PRESENT, PREDOMINANTLY PMN RARE GRAM POSITIVE COCCI IN PAIRS    Culture   Final    RARE Normal respiratory flora-no Staph aureus or Pseudomonas seen Performed at [facility] [address] [place], [place] [address]    Report Status 09/06/2021 FINAL  Final  Culture, blood (Routine X 2) w Reflex to ID Panel     Status: None (Preliminary result)   Collection Time: 09/05/21 10:28 AM   Specimen: BLOOD  Result Value Ref Range Status   Specimen Description   Final    BLOOD BLOOD LEFT WRIST Performed at [facility] [address], [place], [place] [address]    Special Requests   Final    BOTTLES DRAWN AEROBIC AND ANAEROBIC Blood Culture adequate volume Performed at [facility] [address] [place], [place] [address]    Culture   Final    NO GROWTH 2 DAYS Performed at [facility] [address], [place], [place] [address]    Report Status PENDING  Incomplete  Culture, blood (Routine X 2) w Reflex to ID Panel     Status: None (Preliminary result)   Collection Time: 09/05/21 10:29 AM   Specimen: BLOOD  Result Value Ref Range Status   Specimen Description   Final    BLOOD BLOOD RIGHT HAND Performed at [facility] [address], [place], [place] [address]    Special Requests   Final    BOTTLES DRAWN AEROBIC AND ANAEROBIC Blood Culture adequate volume Performed at [facility] [address], [place], [place] [address]    Culture   Final    NO GROWTH 2 DAYS Performed at [facility] [address] [place],  [address]    Report Status PENDING  Incomplete      Studies: No results found.   Flora Lipps, MD  [facility] 09/08/2021  If 7PM-7AM, please contact night-coverage

## 2021-09-09 LAB — BASIC METABOLIC PANEL
Anion gap: 7 (ref 5–15)
BUN: 21 mg/dL (ref 8–23)
CO2: 33 mmol/L — ABNORMAL HIGH (ref 22–32)
Calcium: 8.6 mg/dL — ABNORMAL LOW (ref 8.9–10.3)
Chloride: 98 mmol/L (ref 98–111)
Creatinine, Ser: 0.97 mg/dL (ref 0.61–1.24)
GFR, Estimated: >60 mL/min (ref >60)
Glucose, Bld: 241 mg/dL — ABNORMAL HIGH (ref 70–99)
Potassium: 4.5 mmol/L (ref 3.5–5.1)
Sodium: 138 mmol/L (ref 135–145)

## 2021-09-09 LAB — GLUCOSE, CAPILLARY
Glucose-Capillary: 253 mg/dL — ABNORMAL HIGH (ref 70–99)
Glucose-Capillary: 251 mg/dL — ABNORMAL HIGH (ref 70–99)
Glucose-Capillary: 273 mg/dL — ABNORMAL HIGH (ref 70–99)
Glucose-Capillary: 241 mg/dL — ABNORMAL HIGH (ref 70–99)
Glucose-Capillary: 203 mg/dL — ABNORMAL HIGH (ref 70–99)

## 2021-09-09 LAB — CBC
HCT: 39.8 % (ref 39.0–52.0)
Hemoglobin: 11.1 g/dL — ABNORMAL LOW (ref 13.0–17.0)
MCH: 20 pg — ABNORMAL LOW (ref 26.0–34.0)
MCHC: 27.9 g/dL — ABNORMAL LOW (ref 30.0–36.0)
MCV: 71.8 fL — ABNORMAL LOW (ref 80.0–100.0)
Platelets: 220 10*3/uL (ref 150–400)
RBC: 5.54 MIL/uL (ref 4.22–5.81)
RDW: 19.8 % — ABNORMAL HIGH (ref 11.5–15.5)
WBC: 5.2 10*3/uL (ref 4.0–10.5)
nRBC: 0 % (ref 0.0–0.2)

## 2021-09-09 MED ORDER — NYSTATIN 100000 UNIT/GM EX POWD
Freq: Two times a day (BID) | CUTANEOUS | Status: DC
  Filled 2021-09-09: qty 15

## 2021-09-09 NOTE — Progress Notes (Signed)
PROGRESS NOTE  [name] YTK:[id number] DOB: January 19, 1956 DOA: 09/03/2021 PCP: [name], MD   LOS: 6 days   Brief narrative:  65 year old male with past medical history of heart failure with preserved ejection fraction, hypertension, obstructive sleep apnea, diabetes mellitus, chronic hypoxic respiratory failure on 2 L of oxygen and history of previous cellulitis of the lower extremity presented to hospital from skilled nursing facility with fever, chills and bilateral lower extremity wounds with malodorous discharge. Patient was initially admitted to ICU because he was somnolent and unarousable with severe respiratory acidosis on ABG. Patient was intubated by the ED physician and PCCM was asked to transfer the the ED and was admitted ICU for further management.  Subsequently, patient was considered stable for transfer out of the ICU.  Assessment/Plan:  Principal Problem:   Severe sepsis (HCC) Active Problems:   Cellulitis of left lower extremity   DM2 (diabetes mellitus, type 2) (HCC)   Nonhealing ulcer of left lower extremity (HCC)   Stasis edema of both lower extremities   Chronic respiratory failure with hypoxia (HCC)   Acute on chronic respiratory failure with hypoxia and hypercapnia (HCC)   Chronic diastolic CHF (congestive heart failure) (HCC)   Aortic atherosclerosis (HCC)   Acute respiratory acidosis (HCC)   Acute metabolic encephalopathy   Septic shock (HCC)   Sepsis (HCC)  Septic shock secondary to group B streptococcus from chronic leg wounds and cellulitis, right lower lobe pneumonia.    Septic shock has resolved at this time.  Continue IV Rocephin.  Continue wound care.    Acute on chronic hypoxemic respiratory failure Likely multifactorial from sepsis admitted to secondary to right lower lobe pneumonia and heart failure with preserved ejection fraction on the background of sleep apnea.  Patient does have history of chronic hypoxic respiratory failure at  baseline.  Chest x-ray with right-sided volume loss.Currently at 2 L/min.  At baseline.  chronic diastolic heart failure.  Diuretics have been resumed.  Check BMP closely.  Creatinine at 0.9 today.  Intake and output charting.  Daily weights.  Negative balance 2148 ml  HFpEF with OSA/OHS intolerant to CPAP Continue supplemental oxygen.  Resumed Lasix and lisinopril from 09/07/21.  Will monitor BMP.  Baseline dysphagia continue soft diet..  Nutrition improved.  Continue Glucerna, Ensure, Prosource  DM2 with hyperglycemia.   Continue to monitor closely.  We will change patient's insulin to basal bolus.  Patient takes 30 units twice daily at home, will start with 15 units long-acting at night continue with mealtime and sliding scale insulin.  Closely monitor.  Chronic deconditioning, debility.   Patient is currently at the skilled nursing facility and will need ongoing rehab.  He states that he is able to use a walker at the skilled nursing facility.  Elevated LFTs. Monitor closely.  Mildly elevated but stable.  Disposition.   Patient is from skilled nursing facility.  Plan is to discharge to skilled nursing facility   DVT prophylaxis: SCDs Start: 09/04/21 0532 to skilled nursing facility.  Code Status: Full code  Family Communication:  Spoke with the patient at bedside.  Status is: Inpatient  Remains inpatient appropriate because:  need for rehabilitation, IV Rocephin.  Consultants: PCCM Wound care.  Procedures: Intubation mechanical ventilation and extubation.    Anti-infectives:  Rocephin IV end date 12/21  Anti-infectives (From admission, onward)    Start     Dose/Rate Route Frequency Ordered Stop   09/05/21 1200  cefTRIAXone (ROCEPHIN) 2 g in sodium chloride 0.9 %  100 mL IVPB        2 g 200 mL/hr over 30 Minutes Intravenous Every 24 hours 09/05/21 0830 09/12/21 1159   09/04/21 1400  ceFEPIme (MAXIPIME) 2 g in sodium chloride 0.9 % 100 mL IVPB  Status:  Discontinued         2 g 200 mL/hr over 30 Minutes Intravenous Every 8 hours 09/04/21 0924 09/05/21 0830   09/04/21 1200  cefTRIAXone (ROCEPHIN) 2 g in sodium chloride 0.9 % 100 mL IVPB  Status:  Discontinued        2 g 200 mL/hr over 30 Minutes Intravenous Every 24 hours 09/04/21 0741 09/04/21 0917   09/04/21 0600  vancomycin (VANCOCIN) IVPB 1000 mg/200 mL premix  Status:  Discontinued        1,000 mg 200 mL/hr over 60 Minutes Intravenous Every 12 hours 09/03/21 2206 09/04/21 0741   09/04/21 0200  metroNIDAZOLE (FLAGYL) IVPB 500 mg  Status:  Discontinued        500 mg 100 mL/hr over 60 Minutes Intravenous Every 12 hours 09/03/21 2143 09/04/21 0741   09/03/21 2200  ceFEPIme (MAXIPIME) 2 g in sodium chloride 0.9 % 100 mL IVPB  Status:  Discontinued        2 g 200 mL/hr over 30 Minutes Intravenous Every 8 hours 09/03/21 2146 09/04/21 0741   09/03/21 1400  vancomycin (VANCOREADY) IVPB 2000 mg/400 mL        2,000 mg 200 mL/hr over 120 Minutes Intravenous  Once 09/03/21 1328 09/03/21 1815   09/03/21 1330  ceFEPIme (MAXIPIME) 2 g in sodium chloride 0.9 % 100 mL IVPB        2 g 200 mL/hr over 30 Minutes Intravenous  Once 09/03/21 1321 09/03/21 1446   09/03/21 1330  metroNIDAZOLE (FLAGYL) IVPB 500 mg        500 mg 100 mL/hr over 60 Minutes Intravenous  Once 09/03/21 1321 09/03/21 1603   09/03/21 1330  vancomycin (VANCOCIN) IVPB 1000 mg/200 mL premix  Status:  Discontinued        1,000 mg 200 mL/hr over 60 Minutes Intravenous  Once 09/03/21 1321 09/03/21 1328      Subjective: Today, and examined.  Patient denies any interval complaints.  Denies overt shortness of breath, cough, fever chills nausea vomiting   Objective: Vitals:   09/09/21 0400 09/09/21 0753  BP: (!) 134/51   Pulse: 86   Resp: 20   Temp: 98 F (36.7 C) 98.3 F (36.8 C)  SpO2: 93%     Intake/Output Summary (Last 24 hours) at 09/09/2021 0843 Last data filed at 09/08/2021 2200 Gross per 24 hour  Intake 99.02 ml  Output 1450 ml   Net -1350.98 ml    Filed Weights   09/07/21 0428 09/08/21 0405 09/09/21 0321  Weight: 125 kg 125.4 kg 125 kg   Body mass index is 32.68 kg/m.   Physical Exam:  General: Obese built, not in obvious distress, on nasal cannula oxygen HENT:   No scleral pallor or icterus noted. Oral mucosa is moist.  Chest:  Diminished breath sounds bilaterally. CVS: S1 &S2 [place]. No murmur.  Regular rate and rhythm. Abdomen: Soft, nontender, nondistended.  Bowel sounds are [place].  Umbilical hernia Extremities: No cyanosis, clubbing, bilateral lower extremity edema with ulceration Psych: Alert, awake and oriented, normal mood CNS:  No cranial nerve deficits.  Power equal in all extremities.   Skin: Warm and dry.  Lower extremity lymphedema, leg ulceration     Data Review: I  have personally reviewed the following laboratory data and studies,  CBC: Recent Labs  Lab 09/03/21 1321 09/03/21 2158 09/05/21 0241 09/06/21 0236 09/07/21 0237 09/08/21 0259 09/09/21 0301  WBC 14.3*   < > 7.7 5.6 6.6 5.8 5.2  NEUTROABS 13.2*  --   --   --   --   --   --   HGB 13.1   < > 10.9* 11.5* 10.8* 10.9* 11.1*  HCT 46.5   < > 38.9* 41.0 38.0* 38.4* 39.8  MCV 72.0*   < > 72.3* 71.7* 70.2* 70.1* 71.8*  PLT 238   < > 193 196 205 212 220   < > = values in this interval not displayed.    Basic Metabolic Panel: Recent Labs  Lab 09/04/21 2124 09/05/21 0241 09/05/21 1658 09/06/21 0236 09/07/21 0237 09/08/21 0259 09/09/21 0301  NA  --  136  --  137 139 139 138  K  --  3.7  --  3.7 3.9 4.0 4.5  CL  --  100  --  103 105 101 98  CO2  --  27  --  24 26 29  33*  GLUCOSE  --  188*  --  293* 157* 128* 241*  BUN  --  27*  --  27* 18 17 21   CREATININE  --  1.06  --  0.85 0.86 0.86 0.97  CALCIUM  --  8.1*  --  8.2* 8.2* 8.4* 8.6*  MG 1.9 1.9 1.9 2.0  --  1.8  --   PHOS 3.8 3.4 3.0 2.8  --   --   --     Liver Function Tests: Recent Labs  Lab 09/03/21 1321 09/05/21 0241 09/06/21 0236 09/07/21 0237  09/08/21 0259  AST 26 82* 72* 54* 53*  ALT 26 62* 80* 67* 68*  ALKPHOS 107 93 118 141* 129*  BILITOT 0.3 0.4 0.3 0.6 0.4  PROT 8.2* 6.3* 6.7 6.8 6.9  ALBUMIN 3.5 2.5* 2.5* 2.6* 2.6*    No results for input(s): LIPASE, AMYLASE in the last 168 hours. No results for input(s): AMMONIA in the last 168 hours. Cardiac Enzymes: No results for input(s): CKTOTAL, CKMB, CKMBINDEX, TROPONINI in the last 168 hours. BNP (last 3 results) Recent Labs    06/24/21 2043 09/03/21 2158  BNP 40.1 64.2     ProBNP (last 3 results) No results for input(s): PROBNP in the last 8760 hours.  CBG: Recent Labs  Lab 09/08/21 0802 09/08/21 1134 09/08/21 2108 09/08/21 2310 09/09/21 0722  GLUCAP 149* 277* 225* 270* 251*    Recent Results (from the past 240 hour(s))  Blood Culture (routine x 2)     Status: None   Collection Time: 09/03/21  1:26 PM   Specimen: BLOOD LEFT HAND  Result Value Ref Range Status   Specimen Description   Final    BLOOD LEFT HAND Performed at [facility] [address], [place], [place] [address]    Special Requests   Final    BOTTLES DRAWN AEROBIC AND ANAEROBIC Blood Culture results may not be optimal due to an excessive volume of blood received in culture bottles Performed at [facility] [address], [place], [place] [address]    Culture   Final    NO GROWTH 5 DAYS Performed at [facility], [place] [address], [place], [place] [address]    Report Status 09/08/2021 FINAL  Final  Resp Panel by RT-PCR (Flu A&B, Covid) Nasopharyngeal Swab  Status: None   Collection Time: 09/03/21  1:51 PM   Specimen: Nasopharyngeal Swab; Nasopharyngeal(NP) swabs in vial transport medium  Result Value Ref Range Status   SARS Coronavirus 2 by RT PCR NEGATIVE NEGATIVE Final    Comment: (NOTE) SARS-CoV-2 target nucleic acids are NOT DETECTED.  The SARS-CoV-2 RNA is generally detectable in upper respiratory specimens during the acute  phase of infection. The lowest concentration of SARS-CoV-2 viral copies this assay can detect is 138 copies/mL. A negative result does not preclude SARS-Cov-2 infection and should not be used as the sole basis for treatment or other patient management decisions. A negative result may occur with  improper specimen collection/handling, submission of specimen other than nasopharyngeal swab, presence of viral mutation(s) within the areas targeted by this assay, and inadequate number of viral copies(<138 copies/mL). A negative result must be combined with clinical observations, patient history, and epidemiological information. The expected result is Negative.  Fact Sheet for Patients:  EntrepreneurPulse.com.au  Fact Sheet for Healthcare Providers:  IncredibleEmployment.be  This test is no t yet approved or cleared by the [place] FDA and  has been authorized for detection and/or diagnosis of SARS-CoV-2 by FDA under an Emergency Use Authorization (EUA). This EUA will remain  in effect (meaning this test can be used) for the duration of the COVID-19 declaration under Section 564(b)(1) of the Act, 21 U.S.C.section 360bbb-3(b)(1), unless the authorization is terminated  or revoked sooner.       Influenza A by PCR NEGATIVE NEGATIVE Final   Influenza B by PCR NEGATIVE NEGATIVE Final    Comment: (NOTE) The Xpert Xpress SARS-CoV-2/FLU/RSV plus assay is intended as an aid in the diagnosis of influenza from Nasopharyngeal swab specimens and should not be used as a sole basis for treatment. Nasal washings and aspirates are unacceptable for Xpert Xpress SARS-CoV-2/FLU/RSV testing.  Fact Sheet for Patients: EntrepreneurPulse.com.au  Fact Sheet for Healthcare Providers: IncredibleEmployment.be  This test is not yet approved or cleared by the [place] FDA and has been authorized for detection and/or diagnosis of  SARS-CoV-2 by FDA under an Emergency Use Authorization (EUA). This EUA will remain in effect (meaning this test can be used) for the duration of the COVID-19 declaration under Section 564(b)(1) of the Act, 21 U.S.C. section 360bbb-3(b)(1), unless the authorization is terminated or revoked.  Performed at [facility], [place] [address] [place], [place] [address]   Blood Culture (routine x 2)     Status: Abnormal   Collection Time: 09/03/21  2:00 PM   Specimen: BLOOD  Result Value Ref Range Status   Specimen Description   Final    BLOOD RIGHT ANTECUBITAL Performed at [facility] [address], [place], [place] [address]    Special Requests   Final    BOTTLES DRAWN AEROBIC AND ANAEROBIC Blood Culture results may not be optimal due to an excessive volume of blood received in culture bottles Performed at [facility] [address], [place], [place] [address]    Culture  Setup Time   Final    GRAM POSITIVE COCCI IN CHAINS IN BOTH AEROBIC AND ANAEROBIC BOTTLES CRITICAL RESULT CALLED TO, READ BACK BY AND VERIFIED WITH: [email or web address],[email or web address]@[id number]  09/04/21 [place] Performed at [facility], [place] [address], [place], [place] [address]    Culture STREPTOCOCCUS GROUP G (A)  Final   Report Status 09/06/2021 FINAL  Final   Organism ID, Bacteria STREPTOCOCCUS GROUP G  Final      Susceptibility  Streptococcus group g - MIC*    CLINDAMYCIN >=1 RESISTANT Resistant     AMPICILLIN <=0.25 SENSITIVE Sensitive     ERYTHROMYCIN >=8 RESISTANT Resistant     VANCOMYCIN 0.5 SENSITIVE Sensitive     CEFTRIAXONE <=0.12      LEVOFLOXACIN 0.5 SENSITIVE Sensitive     PENICILLIN Value in next row Sensitive      SENSITIVE0.06    * STREPTOCOCCUS GROUP G  Blood Culture ID Panel (Reflexed)     Status: Abnormal   Collection Time: 09/03/21  2:00 PM  Result Value Ref Range Status   Enterococcus faecalis NOT DETECTED NOT DETECTED Final   Enterococcus Faecium NOT  DETECTED NOT DETECTED Final   Listeria monocytogenes NOT DETECTED NOT DETECTED Final   Staphylococcus species NOT DETECTED NOT DETECTED Final   Staphylococcus aureus (BCID) NOT DETECTED NOT DETECTED Final   Staphylococcus epidermidis NOT DETECTED NOT DETECTED Final   Staphylococcus lugdunensis NOT DETECTED NOT DETECTED Final   Streptococcus species DETECTED (A) NOT DETECTED Final    Comment: Not Enterococcus species, Streptococcus agalactiae, Streptococcus pyogenes, or Streptococcus pneumoniae. CRITICAL RESULT CALLED TO, READ BACK BY AND VERIFIED WITH: T GREEN,PHARMD@0730  09/04/21 [place]    Streptococcus agalactiae NOT DETECTED NOT DETECTED Final   Streptococcus pneumoniae NOT DETECTED NOT DETECTED Final   Streptococcus pyogenes NOT DETECTED NOT DETECTED Final   A.calcoaceticus-baumannii NOT DETECTED NOT DETECTED Final   Bacteroides fragilis NOT DETECTED NOT DETECTED Final   Enterobacterales NOT DETECTED NOT DETECTED Final   Enterobacter cloacae complex NOT DETECTED NOT DETECTED Final   Escherichia coli NOT DETECTED NOT DETECTED Final   Klebsiella aerogenes NOT DETECTED NOT DETECTED Final   Klebsiella oxytoca NOT DETECTED NOT DETECTED Final   Klebsiella pneumoniae NOT DETECTED NOT DETECTED Final   Proteus species NOT DETECTED NOT DETECTED Final   Salmonella species NOT DETECTED NOT DETECTED Final   Serratia marcescens NOT DETECTED NOT DETECTED Final   Haemophilus influenzae NOT DETECTED NOT DETECTED Final   Neisseria meningitidis NOT DETECTED NOT DETECTED Final   Pseudomonas aeruginosa NOT DETECTED NOT DETECTED Final   Stenotrophomonas maltophilia NOT DETECTED NOT DETECTED Final   Candida albicans NOT DETECTED NOT DETECTED Final   Candida auris NOT DETECTED NOT DETECTED Final   Candida glabrata NOT DETECTED NOT DETECTED Final   Candida krusei NOT DETECTED NOT DETECTED Final   Candida parapsilosis NOT DETECTED NOT DETECTED Final   Candida tropicalis NOT DETECTED NOT DETECTED Final    Cryptococcus neoformans/gattii NOT DETECTED NOT DETECTED Final    Comment: Performed at [facility], [address], [place], [place] [address]  Urine Culture     Status: Abnormal   Collection Time: 09/03/21  2:51 PM   Specimen: In/Out Cath Urine  Result Value Ref Range Status   Specimen Description   Final    IN/OUT CATH URINE Performed at [facility] [address], [place], [place] [address]    Special Requests   Final    NONE Performed at [facility], [place] [address] [place], [place] [address]    Culture MULTIPLE SPECIES PRESENT, SUGGEST RECOLLECTION (A)  Final   Report Status 09/04/2021 FINAL  Final  MRSA Next Gen by PCR, Nasal     Status: Abnormal   Collection Time: 09/04/21  8:00 AM   Specimen: Nasal Mucosa; Nasal Swab  Result Value Ref Range Status   MRSA by PCR Next Gen DETECTED (A) NOT DETECTED Final    Comment: RESULT CALLED TO, READ BACK  BY AND VERIFIED WITH: Flint Melter. RN ON 09/04/2021 @ 1225 BY MECIAL J. (NOTE) The GeneXpert MRSA Assay (FDA approved for NASAL specimens only), is one component of a comprehensive MRSA colonization surveillance program. It is not intended to diagnose MRSA infection nor to guide or monitor treatment for MRSA infections. Test performance is not FDA approved in patients less than 64 years old. Performed at [facility], [place] [address] [place], [place] [address]   Culture, Respiratory w Gram Stain     Status: None   Collection Time: 09/04/21  9:38 AM   Specimen: Tracheal Aspirate; Respiratory  Result Value Ref Range Status   Specimen Description   Final    TRACHEAL ASPIRATE Performed at [facility] [address], [place], [place] [address]    Special Requests   Final    NONE Performed at [facility], [place] [address], [place], [place] [address]    Gram Stain   Final    FEW WBC PRESENT, PREDOMINANTLY PMN RARE GRAM POSITIVE  COCCI IN PAIRS    Culture   Final    RARE Normal respiratory flora-no Staph aureus or Pseudomonas seen Performed at [facility] [address], [place], [place] [address]    Report Status 09/06/2021 FINAL  Final  Culture, blood (Routine X 2) w Reflex to ID Panel     Status: None (Preliminary result)   Collection Time: 09/05/21 10:28 AM   Specimen: BLOOD  Result Value Ref Range Status   Specimen Description   Final    BLOOD BLOOD LEFT WRIST Performed at [facility] [address], [place], [place] [address]    Special Requests   Final    BOTTLES DRAWN AEROBIC AND ANAEROBIC Blood Culture adequate volume Performed at [facility] [address] [place], [place] [address]    Culture   Final    NO GROWTH 3 DAYS Performed at [facility], [place] [address], [place], [place] [address]    Report Status PENDING  Incomplete  Culture, blood (Routine X 2) w Reflex to ID Panel     Status: None (Preliminary result)   Collection Time: 09/05/21 10:29 AM   Specimen: BLOOD  Result Value Ref Range Status   Specimen Description   Final    BLOOD BLOOD RIGHT HAND Performed at [facility] [address] [place], [place] [address]    Special Requests   Final    BOTTLES DRAWN AEROBIC AND ANAEROBIC Blood Culture adequate volume Performed at [facility] [address], [place], [place] [address]    Culture   Final    NO GROWTH 3 DAYS Performed at [facility], [place] [address] [place], [place] [address]    Report Status PENDING  Incomplete      Studies: No results found.   Flora Lipps, MD  [facility] 09/09/2021  If 7PM-7AM, please contact night-coverage

## 2021-09-10 LAB — GLUCOSE, CAPILLARY
Glucose-Capillary: 261 mg/dL — ABNORMAL HIGH (ref 70–99)
Glucose-Capillary: 377 mg/dL — ABNORMAL HIGH (ref 70–99)
Glucose-Capillary: 229 mg/dL — ABNORMAL HIGH (ref 70–99)
Glucose-Capillary: 291 mg/dL — ABNORMAL HIGH (ref 70–99)

## 2021-09-10 LAB — CULTURE, BLOOD (ROUTINE X 2)
Culture: NO GROWTH
Culture: NO GROWTH
Special Requests: ADEQUATE
Special Requests: ADEQUATE

## 2021-09-10 LAB — RESP PANEL BY RT-PCR (FLU A&B, COVID) ARPGX2
Influenza A by PCR: NEGATIVE
Influenza B by PCR: NEGATIVE
SARS Coronavirus 2 by RT PCR: NEGATIVE

## 2021-09-10 MED ORDER — NYSTATIN 100000 UNIT/GM EX POWD: Freq: Two times a day (BID) | CUTANEOUS | 0 refills | Status: AC

## 2021-09-10 MED ORDER — POLYETHYLENE GLYCOL 3350 17 G PO PACK: 17.0000 g | PACK | Freq: Every day | ORAL | 0 refills | Status: AC | PRN

## 2021-09-10 MED ORDER — INSULIN GLARGINE-YFGN 100 UNIT/ML ~~LOC~~ SOLN
30.0000 [IU] | Freq: Two times a day (BID) | SUBCUTANEOUS | Status: DC
  Administered 2021-09-10 (×2): 30 [IU] via SUBCUTANEOUS
  Filled 2021-09-10 (×2): qty 0.3

## 2021-09-10 MED ORDER — DOCUSATE SODIUM 100 MG PO CAPS: 100.0000 mg | ORAL_CAPSULE | Freq: Two times a day (BID) | ORAL | Status: AC | PRN

## 2021-09-10 MED ORDER — AMOXICILLIN 500 MG PO CAPS: 500.0000 mg | ORAL_CAPSULE | Freq: Three times a day (TID) | ORAL | 0 refills | Status: AC

## 2021-09-10 MED ORDER — OXYCODONE HCL 10 MG PO TABS
10.0000 mg | ORAL_TABLET | ORAL | 0 refills | Status: AC | PRN
Start: 2021-09-10 — End: 2021-10-10

## 2021-09-10 MED ORDER — ENSURE MAX PROTEIN PO LIQD: 11.0000 [oz_av] | Freq: Every day | ORAL | Status: AC

## 2021-09-10 MED ORDER — PROSOURCE PLUS PO LIQD: 30.0000 mL | Freq: Every day | ORAL | Status: AC

## 2021-09-10 MED ORDER — GLUCERNA SHAKE PO LIQD: 237.0000 mL | ORAL | 0 refills | Status: AC

## 2021-09-10 MED ORDER — PANTOPRAZOLE SODIUM 40 MG PO TBEC
40.0000 mg | DELAYED_RELEASE_TABLET | Freq: Every day | ORAL | Status: AC
Start: 2021-09-11 — End: ?

## 2021-09-10 NOTE — [place] FL2 (Signed)
[place] MEDICAID FL2 LEVEL OF CARE SCREENING TOOL     IDENTIFICATION  Patient Name: [name] Birthdate: [birth year] Sex: male Admission Date (Current Location): 09/03/2021  [place] and [place] Number:  Kathleen Argue [id number] [place] and Address:  [facility],  [place], [place]      Provider Number: [id number]  Attending Physician Name and Address:  Flora Lipps, MD  Relative Name and Phone Number:  Artyom, Stencel   [phone]  [place] Brother  [phone]    Current Level of Care: Hospital Recommended Level of Care: [place] Prior Approval Number:    Date Approved/Denied:   PASRR Number: [id number]  Discharge Plan: SNF    Current Diagnoses: Patient Active Problem List   Diagnosis Date Noted   Sepsis ([place]) 09/04/2021   Severe sepsis ([place]) 09/03/2021   Aortic atherosclerosis ([place]) 09/03/2021   Acute respiratory acidosis ([place]) 38/45/3646   Acute metabolic encephalopathy 80/32/1224   Septic shock ([place]) 09/03/2021   Acute on chronic respiratory failure with hypoxia and hypercapnia ([place]) 06/25/2021   CAP (community acquired pneumonia) 06/25/2021   Chronic diastolic CHF (congestive heart failure) ([place]) 06/25/2021   Benign prostatic hyperplasia with urinary obstruction 08/29/2020   Overactive bladder 08/29/2020   Impotence 08/29/2020   Urge incontinence of urine 08/29/2020   Chronic respiratory failure with hypoxia ([place]) 04/06/2019   Dyslipidemia 04/06/2019   Hypomagnesemia 04/06/2019   MDD (major depressive disorder) 04/06/2019   Psychosis ([place]) 04/05/2019   Dyspnea 02/12/2019   Pain in left wrist 82/50/0370   Diastolic dysfunction 48/88/9169   Primary skin malignancy with unknown cell type 45/11/8880   Acute diastolic heart failure ([place]) 08/25/2016   Hypoxia 08/21/2016   Acute respiratory failure with hypoxia and hypercapnia (HCC) 08/21/2016   Moderate protein-calorie malnutrition ([place]) 08/21/2016    Stasis edema of both lower extremities 08/21/2016   Chronic pain 08/21/2016   Tobacco use disorder 02/23/2016   Venous stasis syndrome 02/09/2016   Spondylosis without myelopathy or radiculopathy, lumbar region 12/13/2015   AKI (acute kidney injury) ([place]) 11/02/2015   Hyperkalemia 11/02/2015   Hypoglycemia 11/02/2015   Cellulitis    Leg ulcer ([place])    Nonhealing ulcer of left lower extremity ([place])    Pain of left leg    Cellulitis of leg 04/25/2015   Degenerative arthritis of hip 04/25/2015   Cellulitis and abscess of leg    Microcytic anemia 03/27/2015   Hypertension    DM2 (diabetes mellitus, type 2) ([place]) 03/20/2015   MSSA (methicillin susceptible Staphylococcus aureus) infection 03/20/2015   Anemia, iron deficiency 03/20/2015   Cellulitis of left lower extremity    Pyomyositis    Systemic infection ([place]) 03/14/2015    Orientation RESPIRATION BLADDER Height & Weight     Self, Time, Situation, Place  O2 (2L) Incontinent Weight: 275 lb 9.2 oz (125 kg) Height:  6\' 5"  (195.6 cm)  BEHAVIORAL SYMPTOMS/MOOD NEUROLOGICAL BOWEL NUTRITION STATUS      Continent Diet (Mechanical Soft)  AMBULATORY STATUS COMMUNICATION OF NEEDS Skin   Extensive Assist Verbally PU Stage and Appropriate Care     PU Stage 3 Dressing: Daily                 Personal Care Assistance Level of Assistance  Bathing, Feeding, Dressing Bathing Assistance: Limited assistance Feeding assistance: Limited assistance Dressing Assistance: Limited assistance     Functional Limitations Info  Sight, Hearing, Speech Sight Info: Adequate Hearing Info: Adequate Speech Info: Adequate    SPECIAL CARE FACTORS FREQUENCY  PT (By licensed PT), OT (By licensed OT)     PT Frequency: Minimum 5x a week OT Frequency: Minimum 5x a week            Contractures Contractures Info: Not present    Additional Factors Info  Code Status, Allergies, Psychotropic, Insulin Sliding Scale, Isolation Precautions Code  Status Info: Full Code Allergies Info: Other   Sulfa Antibiotics Psychotropic Info: ARIPiprazole (ABILIFY) tablet 2 mg Insulin Sliding Scale Info: insulin aspart (novoLOG) injection 0-15 Units 3x a day with meals Isolation Precautions Info: Contact precautions for MRSA     Current Medications (09/10/2021):  This is the current hospital active medication list Current Facility-Administered Medications  Medication Dose Route Frequency Provider Last Rate Last Admin   (feeding supplement) PROSource Plus liquid 30 mL  30 mL Oral Daily Icard, Bradley L, DO   30 mL at 09/09/21 1431   0.9 %  sodium chloride infusion  250 mL Intravenous Continuous Collier Bullock, MD   Stopped at 09/09/21 1436   ARIPiprazole (ABILIFY) tablet 2 mg  2 mg Oral QHS Icard, Bradley L, DO   2 mg at 09/09/21 2124   aspirin chewable tablet 81 mg  81 mg Oral Daily Icard, Bradley L, DO   81 mg at 09/10/21 1021   cefTRIAXone (ROCEPHIN) 2 g in sodium chloride 0.9 % 100 mL IVPB  2 g Intravenous Q24H Pokhrel, Laxman, MD   Stopped at 09/09/21 1255   Chlorhexidine Gluconate Cloth 2 % PADS 6 each  6 each Topical Q0600 Pokhrel, Laxman, MD   6 each at 09/09/21 0941   cholecalciferol (VITAMIN D3) tablet 2,000 Units  2,000 Units Oral q morning Icard, Bradley L, DO   2,000 Units at 09/10/21 1032   docusate sodium (COLACE) capsule 100 mg  100 mg Oral BID PRN Collier Bullock, MD       feeding supplement (GLUCERNA SHAKE) (GLUCERNA SHAKE) liquid 237 mL  237 mL Oral Q24H Icard, Bradley L, DO   237 mL at 09/10/21 1040   fentaNYL (SUBLIMAZE) injection 25 mcg  25 mcg Intravenous Q15 min PRN Corey Harold, NP       fentaNYL (SUBLIMAZE) injection 25-100 mcg  25-100 mcg Intravenous Q30 min PRN Corey Harold, NP   100 mcg at 09/06/21 2234   ferrous sulfate tablet 325 mg  325 mg Oral Q breakfast Icard, Bradley L, DO   325 mg at 09/10/21 0810   furosemide (LASIX) tablet 60 mg  60 mg Oral BID Pokhrel, Laxman, MD   60 mg at 09/10/21 0810   gabapentin  (NEURONTIN) capsule 300 mg  300 mg Oral TID Pokhrel, Laxman, MD   300 mg at 09/10/21 1021   hydrocortisone (ANUSOL-HC) 2.5 % rectal cream 1 application  1 application Rectal Daily PRN Pokhrel, Laxman, MD       insulin aspart (novoLOG) injection 0-15 Units  0-15 Units Subcutaneous TID WC Pokhrel, Laxman, MD   8 Units at 09/10/21 0810   insulin aspart (novoLOG) injection 0-5 Units  0-5 Units Subcutaneous QHS Pokhrel, Laxman, MD   2 Units at 09/09/21 2122   insulin glargine-yfgn (SEMGLEE) injection 30 Units  30 Units Subcutaneous BID Pokhrel, Laxman, MD   30 Units at 09/10/21 1021   labetalol (NORMODYNE) injection 10-20 mg  10-20 mg Intravenous Q2H PRN Frederik Pear, MD       lisinopril (ZESTRIL) tablet 2.5 mg  2.5 mg Oral q morning Pokhrel, Laxman, MD   2.5 mg at 09/10/21 1032  MEDLINE mouth rinse  15 mL Mouth Rinse BID Pokhrel, Laxman, MD   15 mL at 09/10/21 1040   multivitamin with minerals tablet 1 tablet  1 tablet Oral Daily Icard, Bradley L, DO   1 tablet at 09/10/21 1021   nystatin (MYCOSTATIN/NYSTOP) topical powder   Topical BID Pokhrel, Corrie Mckusick, MD   Given at 09/10/21 1022   oxyCODONE (Oxy IR/ROXICODONE) immediate release tablet 10 mg  10 mg Oral Q3H PRN Icard, Bradley L, DO   10 mg at 09/10/21 0816   pantoprazole (PROTONIX) EC tablet 40 mg  40 mg Oral Daily Icard, Bradley L, DO   40 mg at 09/10/21 1021   polyethylene glycol (MIRALAX / GLYCOLAX) packet 17 g  17 g Oral Daily PRN Collier Bullock, MD       protein supplement (ENSURE MAX) liquid  11 oz Oral Daily Icard, Bradley L, DO   11 oz at 09/09/21 2121   sodium chloride (OCEAN) 0.65 % nasal spray 1 spray  1 spray Each Nare PRN Frederik Pear, MD       tamsulosin (FLOMAX) capsule 0.8 mg  0.8 mg Oral QHS Pokhrel, Laxman, MD   0.8 mg at 09/09/21 2124   traZODone (DESYREL) tablet 50 mg  50 mg Oral QHS Icard, Bradley L, DO   50 mg at 09/09/21 2125   Vilazodone HCl (VIIBRYD) TABS 40 mg  40 mg Oral Daily Pokhrel, Laxman, MD   40 mg at  09/10/21 1021     Discharge Medications: Please see discharge summary for a list of discharge medications.  Relevant Imaging Results:  Relevant Lab Results:   Additional Information SSN [id number]  Ross Ludwig, LCSW

## 2021-09-10 NOTE — Discharge Summary (Signed)
Physician Discharge Summary  [name] NLZ:[id number] DOB: Jan 19, 1956 DOA: 09/03/2021  PCP: [name], MD  Admit date: 09/03/2021 Discharge date: 09/10/2021  Admitted From: Skilled nursing facility  Discharge disposition: Skilled nursing facility  Recommendations for Outpatient Follow-Up:   Follow up with your primary care provider in one week.  Check CBC, BMP, magnesium in the next visit Complete the course of antibiotic.  Continue wound care dressing.  Discharge Diagnosis:   Principal Problem:   Severe sepsis (HCC) Active Problems:   Cellulitis of left lower extremity   DM2 (diabetes mellitus, type 2) (HCC)   Nonhealing ulcer of left lower extremity (HCC)   Stasis edema of both lower extremities   Chronic respiratory failure with hypoxia (HCC)   Acute on chronic respiratory failure with hypoxia and hypercapnia (HCC)   Chronic diastolic CHF (congestive heart failure) ([place])   Aortic atherosclerosis ([place])   Acute respiratory acidosis ([place])   Acute metabolic encephalopathy   Septic shock ([place])   Sepsis ([place])   Discharge Condition: Improved.  Diet recommendation: Low sodium, heart healthy.  Carbohydrate-modified.  Soft diet.  Wound care: AFTER washing BLE with soap and water, patting dry, apply heavy amount of Criticaid Clear (purple and white tube in clean utility) to BLE, but NOT between the toes.  Place one folded Aquacel Advantage over the right medial ankle wound, and 4 over the LLE wound. Secure with kerlix.  Code status: Full.  History of Present Illness:   65 year old male with past medical history of heart failure with preserved ejection fraction, hypertension, obstructive sleep apnea, diabetes mellitus, chronic hypoxic respiratory failure on 2 L of oxygen and history of previous cellulitis of the lower extremity presented to hospital from skilled nursing facility with fever, chills and bilateral lower extremity wounds with malodorous discharge. Patient  was initially admitted to ICU because he was somnolent and unarousable with severe respiratory acidosis on ABG. Patient was intubated by the ED physician and PCCM was admitted to the ICU for further management.  Subsequently, patient was extubated and was considered stable for transfer out of the ICU.  Hospital Course:   Following conditions were addressed during hospitalization as listed below,  Septic shock secondary to group B streptococcus bacteremia from chronic leg wounds and cellulitis, right lower lobe pneumonia.    Septic shock has resolved at this time.  Patient received IV Rocephin unti 09/10/2021 and will be transition to oral amoxicillin 500 mg 3 times daily for the next 3 days to complete 10-day course..  Continue wound care at the skilled nursing facility as described above.   Acute on chronic hypoxemic respiratory failure Likely multifactorial from sepsis to secondary to right lower lobe pneumonia and heart failure with preserved ejection fraction on the background of sleep apnea.  Patient does have history of chronic hypoxic respiratory failure at baseline.  Chest x-ray with right-sided volume loss. Currently at 2 L/min and is at baseline.   chronic diastolic heart failure.    Oral diuretics have been resumed.  Compensated at this time.  He received IV diuretics during hospitalization.   HFpEF with OSA/OHS intolerant to CPAP Continue supplemental oxygen.  Resumed Lasix and lisinopril   Baseline dysphagia continue soft diet.  Continue Glucerna, Ensure, Prosource, supplements have been prescribed as well.   DM2 with hyperglycemia.   Patient takes 30 units long-acting insulin twice daily at home, will resume home regimen of linagliptin sliding scale insulin as well.   Chronic deconditioning, debility.   Patient is  currently at the skilled nursing facility and will need ongoing rehab.  He states that he is able to use a walker at the skilled nursing facility.   Elevated  LFTs. Monitor closely.  Mildly elevated but stable.   Disposition.   Patient is from skilled nursing facility.  Plan is to discharge to skilled nursing facility.  Patient is medically stable for disposition.   Medical Consultants:   Pulmonary critical care  Procedures:    Intubation mechanical ventilation and extubation.   Wound care dressing. Subjective:   Today, patient was seen and examined at bedside.  Patient states that he feels better.  Denies any interval complaints.  Denies any nausea vomiting fever chills.  Denies any chest pain  Discharge Exam:   Vitals:   09/10/21 0800 09/10/21 1100  BP: (!) 137/58   Pulse: 81   Resp: 18   Temp:  98.2 F (36.8 C)  SpO2: 95%    Vitals:   09/10/21 0400 09/10/21 0700 09/10/21 0800 09/10/21 1100  BP: (!) 122/53  (!) 137/58   Pulse: 82  81   Resp: 14  18   Temp: 98.4 F (36.9 C) 97.7 F (36.5 C)  98.2 F (36.8 C)  TempSrc: Oral Oral  Oral  SpO2: 95%  95%   Weight:      Height:        General: Alert awake and communicative, obese built.  On nasal cannula oxygen,  HENT:   No scleral pallor or icterus noted. Oral mucosa is moist.  Chest:  Diminished breath sounds bilaterally.  No obvious wheezing noted. CVS: S1 &S2 [place]. No murmur.  Regular rate and rhythm. Abdomen: Soft, nontender, nondistended.  Bowel sounds are [place].  Umbilical hernia present. Extremities: No cyanosis, clubbing, bilateral lower extremity edema with ulceration covered with dressing at this time Psych: Alert, awake and oriented, normal mood CNS:  No cranial nerve deficits.  Power equal in all extremities.   Skin: Warm and dry.  Bilateral lower extremity lymphedema, leg ulcerations, covered with dressing.    The results of significant diagnostics from this hospitalization (including imaging, microbiology, ancillary and laboratory) are listed below for reference.     Diagnostic Studies:   CT CHEST WO CONTRAST  Result Date: 09/04/2021 CLINICAL  DATA:  Fevers, sepsis, low oxygen saturation EXAM: CT CHEST WITHOUT CONTRAST TECHNIQUE: Multidetector CT imaging of the chest was performed following the standard protocol without IV contrast. COMPARISON:  06/25/2021, 02/16/2019 FINDINGS: Cardiovascular: Mild cardiomegaly. Thoracic aortic atherosclerosis. Coronary artery atherosclerosis. No pericardial effusion. Mediastinum/Nodes: Mild mediastinal lymphadenopathy with the largest right lower paratracheal lymph node measuring 14 mm in short axis. No definite hilar lymphadenopathy, but evaluation is limited secondary to lack of IV contrast. No significant interval change compared with 06/25/2021 and less severe compared with 02/16/2019. No axillary lymphadenopathy. Endotracheal tube with the tip 2.5 cm above the carina. Trachea is otherwise unremarkable. Nasogastric tube with the tip in the stomach. Esophagus is otherwise normal. Normal thyroid gland. Lungs/Pleura: Left basilar atelectasis. No definite pleural effusion, but artifact from the patient's arms being at the patient's side somewhat limits evaluation. Complete collapse of the right lower lobe. No definite centrally mass. Small air area of air bronchograms in the left lower lobe concerning for pneumonia. No pneumothorax. Upper Abdomen: No acute abnormality. Musculoskeletal: No acute osseous abnormality. No aggressive osseous lesion. IMPRESSION: 1. Endotracheal tube with the tip 2.5 cm above the carina. 2. Nasogastric tube with the tip in the stomach. 3. Complete collapse of the  right lower lobe without a definite centrally mass. Small air area of air bronchograms in the right lower lobe concerning for pneumonia. 4. Mild left basilar atelectasis. 5.  Aortic Atherosclerosis (ICD10-I70.0). Electronically Signed   By: Kathreen Devoid M.D.   On: 09/04/2021 07:18   DG Chest Port 1 View  Result Date: 09/03/2021 CLINICAL DATA:  OG tube, intubation EXAM: PORTABLE CHEST 1 VIEW COMPARISON:  09/03/2021 at 1433 hours  FINDINGS: Endotracheal tube terminates 3 cm above the carina. Cardiomegaly with pulmonary vascular congestion and suspected mild interstitial edema. Moderate layering right pleural effusion. Associated right middle/lower lobe atelectasis. No pneumothorax. Enteric tube terminates in the gastric cardia. IMPRESSION: Endotracheal tube terminates 3 cm above the carina. Enteric tube terminates in the gastric cardia. Cardiomegaly with suspected mild interstitial edema and moderate layering right pleural effusion. Associated right middle/lower lobe atelectasis. Electronically Signed   By: Julian Hy M.D.   On: 09/03/2021 19:11   DG Chest Port 1 View  Result Date: 09/03/2021 CLINICAL DATA:  Hypoxia EXAM: PORTABLE CHEST 1 VIEW COMPARISON:  06/24/2021 FINDINGS: Stable cardiomediastinal contours. Low lung volumes. Pulmonary vascular congestion with bibasilar atelectasis. No large pleural fluid collection. No pneumothorax. IMPRESSION: Low lung volumes with bibasilar atelectasis and pulmonary vascular congestion. Electronically Signed   By: Davina Poke D.O.   On: 09/03/2021 14:41   DG Abd Portable 1 View  Result Date: 09/03/2021 CLINICAL DATA:  OG tube placement EXAM: PORTABLE ABDOMEN - 1 VIEW COMPARISON:  None. FINDINGS: Enteric tube terminates in the gastric cardia. Nonobstructive bowel gas pattern. Visualized osseous structures are within normal limits. IMPRESSION: Enteric tube terminates in the gastric cardia. Electronically Signed   By: Julian Hy M.D.   On: 09/03/2021 19:13   ECHOCARDIOGRAM COMPLETE  Result Date: 09/04/2021    ECHOCARDIOGRAM REPORT   Patient Name:   [name] Date of Exam: 09/04/2021 Medical Rec #:  [id number]   Height:       77.0 in Accession #:    [id number]  Weight:       264.6 lb Date of Birth:  12-18-1955   BSA:          2.519 m Patient Age:    12 years    BP:           108/45 mmHg Patient Gender: M           HR:           67 bpm. Exam Location:  Inpatient Procedure:  2D Echo, Cardiac Doppler and Color Doppler STAT ECHO Indications:    CHF  History:        Patient has prior history of Echocardiogram examinations, most                 recent 08/24/2016. CHF, Arrythmias:LBBB, Signs/Symptoms:Edema;                 Risk Factors:Diabetes.  Sonographer:    Glo Herring Referring Phys: [id number]  1. Left ventricular ejection fraction, by estimation, is 50 to 55%. The left ventricle has low normal function. The left ventricle has no regional wall motion abnormalities. Left ventricular diastolic parameters are consistent with Grade II diastolic dysfunction (pseudonormalization). Elevated left atrial pressure.  2. Right ventricular systolic function is mildly reduced. The right ventricular size is not well visualized. Tricuspid regurgitation signal is inadequate for assessing PA pressure.  3. Left atrial size was mildly dilated.  4. The mitral valve is normal in structure. No evidence of mitral  valve regurgitation. No evidence of mitral stenosis.  5. The aortic valve is normal in structure. Aortic valve regurgitation is not visualized. No aortic stenosis is present.  6. The inferior vena cava is dilated in size with <50% respiratory variability, suggesting right atrial pressure of 15 mmHg. Comparison(s): Prior images reviewed side by side. The left ventricular function is worsened. The right ventricular systolic function is worse. FINDINGS  Left Ventricle: Left ventricular ejection fraction, by estimation, is 50 to 55%. The left ventricle has low normal function. The left ventricle has no regional wall motion abnormalities. The left ventricular internal cavity size was normal in size. There is no left ventricular hypertrophy. Abnormal (paradoxical) septal motion, consistent with left bundle branch block. Left ventricular diastolic parameters are consistent with Grade II diastolic dysfunction (pseudonormalization). Elevated left atrial  pressure. Right Ventricle:  The right ventricular size is not well visualized. No increase in right ventricular wall thickness. Right ventricular systolic function is mildly reduced. Tricuspid regurgitation signal is inadequate for assessing PA pressure. Left Atrium: Left atrial size was mildly dilated. Right Atrium: Right atrial size was normal in size. Pericardium: Trivial pericardial effusion is present. Mitral Valve: The mitral valve is normal in structure. No evidence of mitral valve regurgitation. No evidence of mitral valve stenosis. Tricuspid Valve: The tricuspid valve is normal in structure. Tricuspid valve regurgitation is not demonstrated. No evidence of tricuspid stenosis. Aortic Valve: The aortic valve is normal in structure. Aortic valve regurgitation is not visualized. No aortic stenosis is present. Aortic valve mean gradient measures 4.0 mmHg. Aortic valve peak gradient measures 7.4 mmHg. Aortic valve area, by VTI measures 2.54 cm. Pulmonic Valve: The pulmonic valve was normal in structure. Pulmonic valve regurgitation is not visualized. No evidence of pulmonic stenosis. Aorta: The aortic root is normal in size and structure. Venous: The inferior vena cava is dilated in size with less than 50% respiratory variability, suggesting right atrial pressure of 15 mmHg. IAS/Shunts: No atrial level shunt detected by color flow Doppler.  LEFT VENTRICLE PLAX 2D LVIDd:         5.20 cm      Diastology LVIDs:         3.30 cm      LV e' medial:    5.87 cm/s LV PW:         1.00 cm      LV E/e' medial:  14.2 LV IVS:        1.00 cm      LV e' lateral:   6.92 cm/s LVOT diam:     2.00 cm      LV E/e' lateral: 12.0 LV SV:         68 LV SV Index:   27 LVOT Area:     3.14 cm  LV Volumes (MOD) LV vol d, MOD A4C: 120.0 ml LV vol s, MOD A4C: 55.8 ml LV SV MOD A4C:     120.0 ml RIGHT VENTRICLE            IVC RV Basal diam:  4.20 cm    IVC diam: 2.40 cm RV S prime:     8.94 cm/s LEFT ATRIUM             Index        RIGHT ATRIUM           Index LA diam:         4.00 cm 1.59 cm/m   RA Area:     18.80 cm LA  Vol [facility]):   73.5 ml 29.18 ml/m  RA Volume:   47.90 ml  19.01 ml/m LA Vol (A4C):   61.9 ml 24.57 ml/m LA Biplane Vol: 67.8 ml 26.91 ml/m  AORTIC VALVE                    PULMONIC VALVE AV Area (Vmax):    2.23 cm     PV Vmax:       0.83 m/s AV Area (Vmean):   2.14 cm     PV Peak grad:  2.8 mmHg AV Area (VTI):     2.54 cm AV Vmax:           136.00 cm/s AV Vmean:          96.500 cm/s AV VTI:            0.267 m AV Peak Grad:      7.4 mmHg AV Mean Grad:      4.0 mmHg LVOT Vmax:         96.60 cm/s LVOT Vmean:        65.600 cm/s LVOT VTI:          0.216 m LVOT/AV VTI ratio: 0.81  AORTA Ao Root diam: 3.30 cm Ao Asc diam:  3.00 cm MITRAL VALVE MV Area (PHT): 3.48 cm    SHUNTS MV Decel Time: 218 msec    Systemic VTI:  0.22 m MV E velocity: 83.10 cm/s  Systemic Diam: 2.00 cm MV A velocity: 73.30 cm/s MV E/A ratio:  1.13 Mihai Croitoru MD Electronically signed by Sanda Klein MD Signature Date/Time: 09/04/2021/5:39:06 PM    Final      Labs:   Basic Metabolic Panel: Recent Labs  Lab 09/03/21 2158 09/04/21 2124 09/05/21 0241 09/05/21 1658 09/06/21 0236 09/07/21 0237 09/08/21 0259 09/09/21 0301  NA  --   --  136  --  137 139 139 138  K   < >  --  3.7  --  3.7 3.9 4.0 4.5  CL  --   --  100  --  103 105 101 98  CO2  --   --  27  --  24 26 29  33*  GLUCOSE  --   --  188*  --  293* 157* 128* 241*  BUN  --   --  27*  --  27* 18 17 21   CREATININE  --   --  1.06  --  0.85 0.86 0.86 0.97  CALCIUM  --   --  8.1*  --  8.2* 8.2* 8.4* 8.6*  MG  --  1.9 1.9 1.9 2.0  --  1.8  --   PHOS  --  3.8 3.4 3.0 2.8  --   --   --    < > = values in this interval not displayed.   GFR Estimated Creatinine Clearance: 108.2 mL/min (by C-G formula based on SCr of 0.97 mg/dL). Liver Function Tests: Recent Labs  Lab 09/05/21 0241 09/06/21 0236 09/07/21 0237 09/08/21 0259  AST 82* 72* 54* 53*  ALT 62* 80* 67* 68*  ALKPHOS 93 118 141* 129*  BILITOT 0.4 0.3 0.6  0.4  PROT 6.3* 6.7 6.8 6.9  ALBUMIN 2.5* 2.5* 2.6* 2.6*   No results for input(s): LIPASE, AMYLASE in the last 168 hours. No results for input(s): AMMONIA in the last 168 hours. Coagulation profile No results for input(s): INR, PROTIME in the last 168 hours.  CBC: Recent Labs  Lab 09/05/21 0241  09/06/21 0236 09/07/21 0237 09/08/21 0259 09/09/21 0301  WBC 7.7 5.6 6.6 5.8 5.2  HGB 10.9* 11.5* 10.8* 10.9* 11.1*  HCT 38.9* 41.0 38.0* 38.4* 39.8  MCV 72.3* 71.7* 70.2* 70.1* 71.8*  PLT 193 196 205 212 220   Cardiac Enzymes: No results for input(s): CKTOTAL, CKMB, CKMBINDEX, TROPONINI in the last 168 hours. BNP: Invalid input(s): POCBNP CBG: Recent Labs  Lab 09/09/21 1158 09/09/21 1603 09/09/21 1955 09/10/21 0733 09/10/21 1135  GLUCAP 273* 241* 203* 261* 377*   D-Dimer No results for input(s): DDIMER in the last 72 hours. Hgb A1c No results for input(s): HGBA1C in the last 72 hours. Lipid Profile No results for input(s): CHOL, HDL, LDLCALC, TRIG, CHOLHDL, LDLDIRECT in the last 72 hours. Thyroid function studies No results for input(s): TSH, T4TOTAL, T3FREE, THYROIDAB in the last 72 hours.  Invalid input(s): FREET3 Anemia work up No results for input(s): VITAMINB12, FOLATE, FERRITIN, TIBC, IRON, RETICCTPCT in the last 72 hours. Microbiology Recent Results (from the past 240 hour(s))  Blood Culture (routine x 2)     Status: None   Collection Time: 09/03/21  1:26 PM   Specimen: BLOOD LEFT HAND  Result Value Ref Range Status   Specimen Description   Final    BLOOD LEFT HAND Performed at [facility], [place] [address] [place], [place] [address]    Special Requests   Final    BOTTLES DRAWN AEROBIC AND ANAEROBIC Blood Culture results may not be optimal due to an excessive volume of blood received in culture bottles Performed at [facility] [address] [place], [place] [address]    Culture   Final    NO GROWTH 5  DAYS Performed at [facility], [place] [address], [place], [place] [address]    Report Status 09/08/2021 FINAL  Final  Resp Panel by RT-PCR (Flu A&B, Covid) Nasopharyngeal Swab     Status: None   Collection Time: 09/03/21  1:51 PM   Specimen: Nasopharyngeal Swab; Nasopharyngeal(NP) swabs in vial transport medium  Result Value Ref Range Status   SARS Coronavirus 2 by RT PCR NEGATIVE NEGATIVE Final    Comment: (NOTE) SARS-CoV-2 target nucleic acids are NOT DETECTED.  The SARS-CoV-2 RNA is generally detectable in upper respiratory specimens during the acute phase of infection. The lowest concentration of SARS-CoV-2 viral copies this assay can detect is 138 copies/mL. A negative result does not preclude SARS-Cov-2 infection and should not be used as the sole basis for treatment or other patient management decisions. A negative result may occur with  improper specimen collection/handling, submission of specimen other than nasopharyngeal swab, presence of viral mutation(s) within the areas targeted by this assay, and inadequate number of viral copies(<138 copies/mL). A negative result must be combined with clinical observations, patient history, and epidemiological information. The expected result is Negative.  Fact Sheet for Patients:  EntrepreneurPulse.com.au  Fact Sheet for Healthcare Providers:  IncredibleEmployment.be  This test is no t yet approved or cleared by the [place] FDA and  has been authorized for detection and/or diagnosis of SARS-CoV-2 by FDA under an Emergency Use Authorization (EUA). This EUA will remain  in effect (meaning this test can be used) for the duration of the COVID-19 declaration under Section 564(b)(1) of the Act, 21 U.S.C.section 360bbb-3(b)(1), unless the authorization is terminated  or revoked sooner.       Influenza A by PCR NEGATIVE NEGATIVE Final   Influenza B by PCR NEGATIVE NEGATIVE Final  Comment: (NOTE) The Xpert Xpress SARS-CoV-2/FLU/RSV plus assay is intended as an aid in the diagnosis of influenza from Nasopharyngeal swab specimens and should not be used as a sole basis for treatment. Nasal washings and aspirates are unacceptable for Xpert Xpress SARS-CoV-2/FLU/RSV testing.  Fact Sheet for Patients: EntrepreneurPulse.com.au  Fact Sheet for Healthcare Providers: IncredibleEmployment.be  This test is not yet approved or cleared by the [place] FDA and has been authorized for detection and/or diagnosis of SARS-CoV-2 by FDA under an Emergency Use Authorization (EUA). This EUA will remain in effect (meaning this test can be used) for the duration of the COVID-19 declaration under Section 564(b)(1) of the Act, 21 U.S.C. section 360bbb-3(b)(1), unless the authorization is terminated or revoked.  Performed at [facility], [place] [address] [place], [place] [address]   Blood Culture (routine x 2)     Status: Abnormal   Collection Time: 09/03/21  2:00 PM   Specimen: BLOOD  Result Value Ref Range Status   Specimen Description   Final    BLOOD RIGHT ANTECUBITAL Performed at [facility] [address], [place], [place] [address]    Special Requests   Final    BOTTLES DRAWN AEROBIC AND ANAEROBIC Blood Culture results may not be optimal due to an excessive volume of blood received in culture bottles Performed at [facility] [address] [place], [place] [address]    Culture  Setup Time   Final    GRAM POSITIVE COCCI IN CHAINS IN BOTH AEROBIC AND ANAEROBIC BOTTLES CRITICAL RESULT CALLED TO, READ BACK BY AND VERIFIED WITH: [email or web address]  09/04/21 [place] Performed at [facility], [place] [address] [place], [place] [address]    Culture STREPTOCOCCUS GROUP G (A)  Final   Report Status 09/06/2021 FINAL  Final   Organism ID, Bacteria STREPTOCOCCUS GROUP G  Final       Susceptibility   Streptococcus group g - MIC*    CLINDAMYCIN >=1 RESISTANT Resistant     AMPICILLIN <=0.25 SENSITIVE Sensitive     ERYTHROMYCIN >=8 RESISTANT Resistant     VANCOMYCIN 0.5 SENSITIVE Sensitive     CEFTRIAXONE <=0.12      LEVOFLOXACIN 0.5 SENSITIVE Sensitive     PENICILLIN Value in next row Sensitive      SENSITIVE0.06    * STREPTOCOCCUS GROUP G  Blood Culture ID Panel (Reflexed)     Status: Abnormal   Collection Time: 09/03/21  2:00 PM  Result Value Ref Range Status   Enterococcus faecalis NOT DETECTED NOT DETECTED Final   Enterococcus Faecium NOT DETECTED NOT DETECTED Final   Listeria monocytogenes NOT DETECTED NOT DETECTED Final   Staphylococcus species NOT DETECTED NOT DETECTED Final   Staphylococcus aureus (BCID) NOT DETECTED NOT DETECTED Final   Staphylococcus epidermidis NOT DETECTED NOT DETECTED Final   Staphylococcus lugdunensis NOT DETECTED NOT DETECTED Final   Streptococcus species DETECTED (A) NOT DETECTED Final    Comment: Not Enterococcus species, Streptococcus agalactiae, Streptococcus pyogenes, or Streptococcus pneumoniae. CRITICAL RESULT CALLED TO, READ BACK BY AND VERIFIED WITH: T [email or web address]  09/04/21 [place]    Streptococcus agalactiae NOT DETECTED NOT DETECTED Final   Streptococcus pneumoniae NOT DETECTED NOT DETECTED Final   Streptococcus pyogenes NOT DETECTED NOT DETECTED Final   A.calcoaceticus-baumannii NOT DETECTED NOT DETECTED Final   Bacteroides fragilis NOT DETECTED NOT DETECTED Final   Enterobacterales NOT DETECTED NOT DETECTED Final   Enterobacter cloacae complex NOT DETECTED NOT DETECTED Final   Escherichia coli NOT DETECTED  NOT DETECTED Final   Klebsiella aerogenes NOT DETECTED NOT DETECTED Final   Klebsiella oxytoca NOT DETECTED NOT DETECTED Final   Klebsiella pneumoniae NOT DETECTED NOT DETECTED Final   Proteus species NOT DETECTED NOT DETECTED Final   Salmonella species NOT DETECTED NOT DETECTED Final   Serratia marcescens NOT  DETECTED NOT DETECTED Final   Haemophilus influenzae NOT DETECTED NOT DETECTED Final   Neisseria meningitidis NOT DETECTED NOT DETECTED Final   Pseudomonas aeruginosa NOT DETECTED NOT DETECTED Final   Stenotrophomonas maltophilia NOT DETECTED NOT DETECTED Final   Candida albicans NOT DETECTED NOT DETECTED Final   Candida auris NOT DETECTED NOT DETECTED Final   Candida glabrata NOT DETECTED NOT DETECTED Final   Candida krusei NOT DETECTED NOT DETECTED Final   Candida parapsilosis NOT DETECTED NOT DETECTED Final   Candida tropicalis NOT DETECTED NOT DETECTED Final   Cryptococcus neoformans/gattii NOT DETECTED NOT DETECTED Final    Comment: Performed at [facility], [place] [address], [place], [place] [address]  Urine Culture     Status: Abnormal   Collection Time: 09/03/21  2:51 PM   Specimen: In/Out Cath Urine  Result Value Ref Range Status   Specimen Description   Final    IN/OUT CATH URINE Performed at [facility] [address], [place], [place] [address]    Special Requests   Final    NONE Performed at [facility], [place] [address], [place], [place] [address]    Culture MULTIPLE SPECIES PRESENT, SUGGEST RECOLLECTION (A)  Final   Report Status 09/04/2021 FINAL  Final  MRSA Next Gen by PCR, Nasal     Status: Abnormal   Collection Time: 09/04/21  8:00 AM   Specimen: Nasal Mucosa; Nasal Swab  Result Value Ref Range Status   MRSA by PCR Next Gen DETECTED (A) NOT DETECTED Final    Comment: RESULT CALLED TO, READ BACK BY AND VERIFIED WITH: [place], J. RN ON 09/04/2021 @ 1225 BY MECIAL J. (NOTE) The GeneXpert MRSA Assay (FDA approved for NASAL specimens only), is one component of a comprehensive MRSA colonization surveillance program. It is not intended to diagnose MRSA infection nor to guide or monitor treatment for MRSA infections. Test performance is not FDA approved in patients less than 36 years old. Performed at [facility], [place] [address] [place], [place] [address]   Culture, Respiratory w Gram Stain     Status: None   Collection Time: 09/04/21  9:38 AM   Specimen: Tracheal Aspirate; Respiratory  Result Value Ref Range Status   Specimen Description   Final    TRACHEAL ASPIRATE Performed at [place] [address], [place], [place] [address]    Special Requests   Final    NONE Performed at [facility], [place] [address], [place], [place] [address]    Gram Stain   Final    FEW WBC PRESENT, PREDOMINANTLY PMN RARE GRAM POSITIVE COCCI IN PAIRS    Culture   Final    RARE Normal respiratory flora-no Staph aureus or Pseudomonas seen Performed at [place] [address], [place], [place] [address]    Report Status 09/06/2021 FINAL  Final  Culture, blood (Routine X 2) w Reflex to ID Panel     Status: None   Collection Time: 09/05/21 10:28 AM   Specimen: BLOOD  Result Value Ref Range Status   Specimen Description   Final    BLOOD BLOOD  LEFT WRIST Performed at [facility], [place] [address], [place], [place] [address]    Special Requests   Final    BOTTLES DRAWN AEROBIC AND ANAEROBIC Blood Culture adequate volume Performed at [place] [address], [place], [place] [address]    Culture   Final    NO GROWTH 5 DAYS Performed at [facility], [place] [address] [place], [place] [address]    Report Status 09/10/2021 FINAL  Final  Culture, blood (Routine X 2) w Reflex to ID Panel     Status: None   Collection Time: 09/05/21 10:29 AM   Specimen: BLOOD  Result Value Ref Range Status   Specimen Description   Final    BLOOD BLOOD RIGHT HAND Performed at [place] [address] [place], [place] [address]    Special Requests   Final    BOTTLES DRAWN AEROBIC AND ANAEROBIC Blood Culture adequate volume Performed at [place] [address],  [place], [place] [address]    Culture   Final    NO GROWTH 5 DAYS Performed at [facility], [place] [address], [place],  [address]    Report Status 09/10/2021 FINAL  Final  Resp Panel by RT-PCR (Flu A&B, Covid) Nasopharyngeal Swab     Status: None   Collection Time: 09/10/21 12:11 PM   Specimen: Nasopharyngeal Swab; Nasopharyngeal(NP) swabs in vial transport medium  Result Value Ref Range Status   SARS Coronavirus 2 by RT PCR NEGATIVE NEGATIVE Final    Comment: (NOTE) SARS-CoV-2 target nucleic acids are NOT DETECTED.  The SARS-CoV-2 RNA is generally detectable in upper respiratory specimens during the acute phase of infection. The lowest concentration of SARS-CoV-2 viral copies this assay can detect is 138 copies/mL. A negative result does not preclude SARS-Cov-2 infection and should not be used as the sole basis for treatment or other patient management decisions. A negative result may occur with  improper specimen collection/handling, submission of specimen other than nasopharyngeal swab, presence of viral mutation(s) within the areas targeted by this assay, and inadequate number of viral copies(<138 copies/mL). A negative result must be combined with clinical observations, patient history, and epidemiological information. The expected result is Negative.  Fact Sheet for Patients:  EntrepreneurPulse.com.au  Fact Sheet for Healthcare Providers:  IncredibleEmployment.be  This test is no t yet approved or cleared by the [place] FDA and  has been authorized for detection and/or diagnosis of SARS-CoV-2 by FDA under an Emergency Use Authorization (EUA). This EUA will remain  in effect (meaning this test can be used) for the duration of the COVID-19 declaration under Section 564(b)(1) of the Act, 21 U.S.C.section 360bbb-3(b)(1), unless the authorization is terminated  or revoked sooner.       Influenza A by PCR NEGATIVE NEGATIVE  Final   Influenza B by PCR NEGATIVE NEGATIVE Final    Comment: (NOTE) The Xpert Xpress SARS-CoV-2/FLU/RSV plus assay is intended as an aid in the diagnosis of influenza from Nasopharyngeal swab specimens and should not be used as a sole basis for treatment. Nasal washings and aspirates are unacceptable for Xpert Xpress SARS-CoV-2/FLU/RSV testing.  Fact Sheet for Patients: EntrepreneurPulse.com.au  Fact Sheet for Healthcare Providers: IncredibleEmployment.be  This test is not yet approved or cleared by the [place] FDA and has been authorized for detection and/or diagnosis of SARS-CoV-2 by FDA under an Emergency Use Authorization (EUA). This EUA will remain in effect (meaning this test can be  used) for the duration of the COVID-19 declaration under Section 564(b)(1) of the Act, 21 U.S.C. section 360bbb-3(b)(1), unless the authorization is terminated or revoked.  Performed at [facility], [place] [address], [place], [place] [address]      Discharge Instructions:   Discharge Instructions     Avoid straining   Complete by: As directed    Diet - low sodium heart healthy   Complete by: As directed    Soft diet   Diet Carb Modified   Complete by: As directed    Discharge instructions   Complete by: As directed    Follow-up with your primary care provider at the skilled nursing facility in 3 to 5 days.  Check blood work at that time.  Continue to take all medications as prescribed.  Continue wound care and dressing.  Continue oxygen   Heart Failure patients record your daily weight using the same scale at the same time of day   Complete by: As directed    Increase activity slowly   Complete by: As directed    STOP any activity that causes chest pain, shortness of breath, dizziness, sweating, or exessive weakness   Complete by: As directed       Allergies as of 09/10/2021       Reactions   Other Other (See  Comments)   Pt is a Jehov[employer] Witness. No blood products.   Sulfa Antibiotics Other (See Comments)   Causes flu-like symptom : sweating, chills, fever, body aches        Medication List     TAKE these medications    (feeding supplement) PROSource Plus liquid Take 30 mLs by mouth daily.   Ensure Max Protein Liqd Take 330 mLs (11 oz total) by mouth daily.   feeding supplement (GLUCERNA SHAKE) Liqd Take 237 mLs by mouth daily. Start taking on: September 11, 2021   acetaminophen 325 MG tablet Commonly known as: TYLENOL Take 2 tablets (650 mg total) by mouth every 6 (six) hours as needed for mild pain (or Fever >/= 101).   amoxicillin 500 MG capsule Commonly known as: AMOXIL Take 1 capsule (500 mg total) by mouth 3 (three) times daily for 3 days.   ARIPiprazole 2 MG tablet Commonly known as: ABILIFY Take 2 mg by mouth at bedtime.   aspirin EC 81 MG tablet Take 81 mg by mouth every morning.   Basaglar KwikPen 100 UNIT/ML Inject 30 Units into the skin 2 (two) times daily.   benzocaine 10 % mucosal gel Commonly known as: ORAJEL Use as directed 1 application in the mouth or throat every 2 (two) hours as needed for mouth pain (soreness).   betamethasone dipropionate 0.05 % lotion Apply 1 application topically every 12 (twelve) hours as needed (itching).   chlorhexidine 0.12 % solution Commonly known as: PERIDEX Use as directed 5 mLs in the mouth or throat 2 (two) times daily.   docusate sodium 100 MG capsule Commonly known as: COLACE Take 1 capsule (100 mg total) by mouth 2 (two) times daily as needed for mild constipation.   feeding supplement (PRO-STAT 64) Liqd Take 45 mLs by mouth 2 (two) times daily.   ferrous sulfate 325 (65 FE) MG tablet Take 325 mg by mouth every morning.   furosemide 20 MG tablet Commonly known as: LASIX Take 60 mg by mouth 2 (two) times daily.   gabapentin 300 MG capsule Commonly known as: NEURONTIN Take 300 mg by mouth 3 (three) times  daily.  Glucagon Emergency 1 MG/ML Solr Inject 1 mg into the muscle once as needed (hypoglycemia).   hydrocortisone 2.5 % rectal cream Commonly known as: ANUSOL-HC Place 1 application rectally daily as needed for hemorrhoids or anal itching.   insulin regular 100 units/mL injection Commonly known as: NOVOLIN R Inject 2-10 Units into the skin See admin instructions. Inject 2-10 units subcutaneously four times daily per sliding scale: CBG 140-199 2 units, 200-250 4 units, 251-299 6 units, 300-349 8 units, 350-400 10 units, above 400 call MD for coverage   linagliptin 5 MG Tabs tablet Commonly known as: TRADJENTA Take 5 mg by mouth every morning.   lisinopril 2.5 MG tablet Commonly known as: ZESTRIL Take 1 tablet (2.5 mg total) by mouth daily. What changed: when to take this   melatonin 3 MG Tabs tablet Take 3 mg by mouth at bedtime.   METAMUCIL FIBER SINGLES PO Take 2 Wafers by mouth 2 (two) times daily.   metFORMIN 500 MG tablet Commonly known as: GLUCOPHAGE Take 500 mg by mouth 2 (two) times daily with a meal.   multivitamin with minerals Tabs tablet Take 1 tablet by mouth daily. What changed: when to take this   nitroGLYCERIN 0.4 MG SL tablet Commonly known as: NITROSTAT Place 0.4 mg under the tongue every 5 (five) minutes as needed for chest pain.   nystatin powder Commonly known as: MYCOSTATIN/NYSTOP Apply topically 2 (two) times daily. Apply to folds   Oxycodone HCl 10 MG Tabs Take 1 tablet (10 mg total) by mouth every 4 (four) hours as needed for moderate pain or severe pain.   pantoprazole 40 MG tablet Commonly known as: PROTONIX Take 1 tablet (40 mg total) by mouth daily. Start taking on: September 11, 2021   polyethylene glycol 17 g packet Commonly known as: MIRALAX / GLYCOLAX Take 17 g by mouth daily as needed for moderate constipation.   tamsulosin 0.4 MG Caps capsule Commonly known as: FLOMAX Take 0.8 mg by mouth at bedtime.   tiZANidine 4 MG  tablet Commonly known as: ZANAFLEX Take 4 mg by mouth every 8 (eight) hours as needed for muscle spasms.   traZODone 50 MG tablet Commonly known as: DESYREL Take 50 mg by mouth at bedtime.   triamcinolone cream 0.1 % Commonly known as: KENALOG Apply 1 application topically 2 (two) times daily. For rash   Vilazodone HCl 40 MG Tabs Commonly known as: VIIBRYD Take 40 mg by mouth every morning.   Vitamin D 50 MCG (2000 UT) tablet Take 2,000 Units by mouth every morning.        Contact information for after-discharge care     [facility] .   Service: Skilled Nursing Contact information: [place] [phone]                      Time coordinating discharge: 39 minutes  Signed:  Jesselee Poth  [facility] 09/10/2021, 2:21 PM

## 2021-09-10 NOTE — Progress Notes (Signed)
PROGRESS NOTE  Luccas Towell IRS:[id number] DOB: Dec 14, 1955 DOA: 09/03/2021 PCP: [name], MD   LOS: 7 days   Brief narrative:  65 year old male with past medical history of heart failure with preserved ejection fraction, hypertension, obstructive sleep apnea, diabetes mellitus, chronic hypoxic respiratory failure on 2 L of oxygen and history of previous cellulitis of the lower extremity presented to hospital from skilled nursing facility with fever, chills and bilateral lower extremity wounds with malodorous discharge. Patient was initially admitted to ICU because he was somnolent and unarousable with severe respiratory acidosis on ABG. Patient was intubated by the ED physician and PCCM was admitted to the ICU for further management.  Subsequently, patient was extubated and was considered stable for transfer out of the ICU.  At this time, patient is getting IV Rocephin for cellulitis and sepsis until 09/12/2021.  He is awaiting for skilled nursing facility placement.  Medically stable for disposition.  Assessment/Plan:  Principal Problem:   Severe sepsis (HCC) Active Problems:   Cellulitis of left lower extremity   DM2 (diabetes mellitus, type 2) (HCC)   Nonhealing ulcer of left lower extremity (HCC)   Stasis edema of both lower extremities   Chronic respiratory failure with hypoxia (HCC)   Acute on chronic respiratory failure with hypoxia and hypercapnia (HCC)   Chronic diastolic CHF (congestive heart failure) (HCC)   Aortic atherosclerosis (HCC)   Acute respiratory acidosis (HCC)   Acute metabolic encephalopathy   Septic shock (HCC)   Sepsis (HCC)  Septic shock secondary to group B streptococcus bacteremia from chronic leg wounds and cellulitis, right lower lobe pneumonia.    Septic shock has resolved at this time.  Continue IV Rocephin until 12/21.  Continue wound care.    Acute on chronic hypoxemic respiratory failure Likely multifactorial from sepsis to secondary to  right lower lobe pneumonia and heart failure with preserved ejection fraction on the background of sleep apnea.  Patient does have history of chronic hypoxic respiratory failure at baseline.  Chest x-ray with right-sided volume loss.Currently at 2 L/min and is at baseline.  chronic diastolic heart failure.   Continue CHF protocol intake and output charting.  Daily weights.  Negative balance 4241 mL.  Oral diuretics have been resumed.  Compensated at this time  HFpEF with OSA/OHS intolerant to CPAP Continue supplemental oxygen.  Resumed Lasix and lisinopril from 09/07/21.  Will monitor BMP.  Baseline dysphagia continue soft diet.  Continue Glucerna, Ensure, Prosource  DM2 with hyperglycemia.   Patient takes 30 units long-acting insulin twice daily at home, will resume  home dosing.  Continue  sliding scale insulin.  Closely monitor.  Chronic deconditioning, debility.   Patient is currently at the skilled nursing facility and will need ongoing rehab.  He states that he is able to use a walker at the skilled nursing facility.  Elevated LFTs. Monitor closely.  Mildly elevated but stable.  Disposition.   Patient is from skilled nursing facility.  Plan is to discharge to skilled nursing facility.  Patient is medically stable for disposition.  DVT prophylaxis: SCDs Start: 09/04/21 0532   Code Status: Full code  Family Communication:  Spoke with the patient at bedside.  I tried to reach the patient's brother on the phone but was unable to reach him.  Status is: Inpatient  Remains inpatient appropriate because:  need for rehabilitation, IV Rocephin.  Consultants: PCCM Wound care.  Procedures: Intubation mechanical ventilation and extubation.    Anti-infectives:  Rocephin IV -end date 12/21  Subjective: Today, patient was seen and examined at bedside.  Patient states that he feels better.  Denies any interval complaints.  Denies any nausea vomiting fever chills.  Denies any chest  pain   Objective: Vitals:   09/10/21 0000 09/10/21 0400  BP: (!) 138/56 (!) 122/53  Pulse: 79 82  Resp: 15 14  Temp:  98.4 F (36.9 C)  SpO2: 92% 95%    Intake/Output Summary (Last 24 hours) at 09/10/2021 0652 Last data filed at 09/10/2021 0130 Gross per 24 hour  Intake 357.08 ml  Output 2450 ml  Net -2092.92 ml    Filed Weights   09/07/21 0428 09/08/21 0405 09/09/21 0321  Weight: 125 kg 125.4 kg 125 kg   Body mass index is 32.68 kg/m.   Physical Exam:  General: Alert awake and communicative, obese built.  On nasal cannula oxygen,  HENT:   No scleral pallor or icterus noted. Oral mucosa is moist.  Chest:  Diminished breath sounds bilaterally.  No obvious wheezing noted. CVS: S1 &S2 [place]. No murmur.  Regular rate and rhythm. Abdomen: Soft, nontender, nondistended.  Bowel sounds are [place]ader.  Umbilical hernia present. Extremities: No cyanosis, clubbing, bilateral lower extremity edema with ulceration covered with dressing at this time Psych: Alert, awake and oriented, normal mood CNS:  No cranial nerve deficits.  Power equal in all extremities.   Skin: Warm and dry.  Bilateral lower extremity lymphedema, leg ulcerations, covered with dressing.     Data Review: I have personally reviewed the following laboratory data and studies,  CBC: Recent Labs  Lab 09/03/21 1321 09/03/21 2158 09/05/21 0241 09/06/21 0236 09/07/21 0237 09/08/21 0259 09/09/21 0301  WBC 14.3*   < > 7.7 5.6 6.6 5.8 5.2  NEUTROABS 13.2*  --   --   --   --   --   --   HGB 13.1   < > 10.9* 11.5* 10.8* 10.9* 11.1*  HCT 46.5   < > 38.9* 41.0 38.0* 38.4* 39.8  MCV 72.0*   < > 72.3* 71.7* 70.2* 70.1* 71.8*  PLT 238   < > 193 196 205 212 220   < > = values in this interval not displayed.    Basic Metabolic Panel: Recent Labs  Lab 09/04/21 2124 09/05/21 0241 09/05/21 1658 09/06/21 0236 09/07/21 0237 09/08/21 0259 09/09/21 0301  NA  --  136  --  137 139 139 138  K  --  3.7  --  3.7 3.9 4.0  4.5  CL  --  100  --  103 105 101 98  CO2  --  27  --  24 26 29  33*  GLUCOSE  --  188*  --  293* 157* 128* 241*  BUN  --  27*  --  27* 18 17 21   CREATININE  --  1.06  --  0.85 0.86 0.86 0.97  CALCIUM  --  8.1*  --  8.2* 8.2* 8.4* 8.6*  MG 1.9 1.9 1.9 2.0  --  1.8  --   PHOS 3.8 3.4 3.0 2.8  --   --   --     Liver Function Tests: Recent Labs  Lab 09/03/21 1321 09/05/21 0241 09/06/21 0236 09/07/21 0237 09/08/21 0259  AST 26 82* 72* 54* 53*  ALT 26 62* 80* 67* 68*  ALKPHOS 107 93 118 141* 129*  BILITOT 0.3 0.4 0.3 0.6 0.4  PROT 8.2* 6.3* 6.7 6.8 6.9  ALBUMIN 3.5 2.5* 2.5* 2.6* 2.6*    No  results for input(s): LIPASE, AMYLASE in the last 168 hours. No results for input(s): AMMONIA in the last 168 hours. Cardiac Enzymes: No results for input(s): CKTOTAL, CKMB, CKMBINDEX, TROPONINI in the last 168 hours. BNP (last 3 results) Recent Labs    06/24/21 2043 09/03/21 2158  BNP 40.1 64.2     ProBNP (last 3 results) No results for input(s): PROBNP in the last 8760 hours.  CBG: Recent Labs  Lab 09/08/21 2310 09/09/21 0722 09/09/21 1158 09/09/21 1603 09/09/21 1955  GLUCAP 270* 251* 273* 241* 203*    Recent Results (from the past 240 hour(s))  Blood Culture (routine x 2)     Status: None   Collection Time: 09/03/21  1:26 PM   Specimen: BLOOD LEFT HAND  Result Value Ref Range Status   Specimen Description   Final    BLOOD LEFT HAND Performed at [facility], [place] [address], [place], [place] [address]    Special Requests   Final    BOTTLES DRAWN AEROBIC AND ANAEROBIC Blood Culture results may not be optimal due to an excessive volume of blood received in culture bottles Performed at [place] [address], [place], [place] [address]    Culture   Final    NO GROWTH 5 DAYS Performed at [facility], [place] [address] [place], [place] [address]    Report Status 09/08/2021 FINAL  Final  Resp Panel by RT-PCR (Flu A&B,  Covid) Nasopharyngeal Swab     Status: None   Collection Time: 09/03/21  1:51 PM   Specimen: Nasopharyngeal Swab; Nasopharyngeal(NP) swabs in vial transport medium  Result Value Ref Range Status   SARS Coronavirus 2 by RT PCR NEGATIVE NEGATIVE Final    Comment: (NOTE) SARS-CoV-2 target nucleic acids are NOT DETECTED.  The SARS-CoV-2 RNA is generally detectable in upper respiratory specimens during the acute phase of infection. The lowest concentration of SARS-CoV-2 viral copies this assay can detect is 138 copies/mL. A negative result does not preclude SARS-Cov-2 infection and should not be used as the sole basis for treatment or other patient management decisions. A negative result may occur with  improper specimen collection/handling, submission of specimen other than nasopharyngeal swab, presence of viral mutation(s) within the areas targeted by this assay, and inadequate number of viral copies(<138 copies/mL). A negative result must be combined with clinical observations, patient history, and epidemiological information. The expected result is Negative.  Fact Sheet for Patients:  EntrepreneurPulse.com.au  Fact Sheet for Healthcare Providers:  IncredibleEmployment.be  This test is no t yet approved or cleared by the [place] FDA and  has been authorized for detection and/or diagnosis of SARS-CoV-2 by FDA under an Emergency Use Authorization (EUA). This EUA will remain  in effect (meaning this test can be used) for the duration of the COVID-19 declaration under Section 564(b)(1) of the Act, 21 U.S.C.section 360bbb-3(b)(1), unless the authorization is terminated  or revoked sooner.       Influenza A by PCR NEGATIVE NEGATIVE Final   Influenza B by PCR NEGATIVE NEGATIVE Final    Comment: (NOTE) The Xpert Xpress SARS-CoV-2/FLU/RSV plus assay is intended as an aid in the diagnosis of influenza from Nasopharyngeal swab specimens and should  not be used as a sole basis for treatment. Nasal washings and aspirates are unacceptable for Xpert Xpress SARS-CoV-2/FLU/RSV testing.  Fact Sheet for Patients: EntrepreneurPulse.com.au  Fact Sheet for Healthcare Providers: IncredibleEmployment.be  This test is not yet approved or cleared by the [place]  States FDA and has been authorized for detection and/or diagnosis of SARS-CoV-2 by FDA under an Emergency Use Authorization (EUA). This EUA will remain in effect (meaning this test can be used) for the duration of the COVID-19 declaration under Section 564(b)(1) of the Act, 21 U.S.C. section 360bbb-3(b)(1), unless the authorization is terminated or revoked.  Performed at [facility], [place] [address] [place], [place] [address]   Blood Culture (routine x 2)     Status: Abnormal   Collection Time: 09/03/21  2:00 PM   Specimen: BLOOD  Result Value Ref Range Status   Specimen Description   Final    BLOOD RIGHT ANTECUBITAL Performed at [facility] [address], [place], [place] [address]    Special Requests   Final    BOTTLES DRAWN AEROBIC AND ANAEROBIC Blood Culture results may not be optimal due to an excessive volume of blood received in culture bottles Performed at [facility] [address], [place], [place] [address]    Culture  Setup Time   Final    GRAM POSITIVE COCCI IN CHAINS IN BOTH AEROBIC AND ANAEROBIC BOTTLES CRITICAL RESULT CALLED TO, READ BACK BY AND VERIFIED WITH: [email or web address]  09/04/21 [place] Performed at [facility], [place] [address] [place], [place] [address]    Culture STREPTOCOCCUS GROUP G (A)  Final   Report Status 09/06/2021 FINAL  Final   Organism ID, Bacteria STREPTOCOCCUS GROUP G  Final      Susceptibility   Streptococcus group g - MIC*    CLINDAMYCIN >=1 RESISTANT Resistant     AMPICILLIN <=0.25 SENSITIVE Sensitive     ERYTHROMYCIN >=8 RESISTANT  Resistant     VANCOMYCIN 0.5 SENSITIVE Sensitive     CEFTRIAXONE <=0.12      LEVOFLOXACIN 0.5 SENSITIVE Sensitive     PENICILLIN Value in next row Sensitive      SENSITIVE0.06    * STREPTOCOCCUS GROUP G  Blood Culture ID Panel (Reflexed)     Status: Abnormal   Collection Time: 09/03/21  2:00 PM  Result Value Ref Range Status   Enterococcus faecalis NOT DETECTED NOT DETECTED Final   Enterococcus Faecium NOT DETECTED NOT DETECTED Final   Listeria monocytogenes NOT DETECTED NOT DETECTED Final   Staphylococcus species NOT DETECTED NOT DETECTED Final   Staphylococcus aureus (BCID) NOT DETECTED NOT DETECTED Final   Staphylococcus epidermidis NOT DETECTED NOT DETECTED Final   Staphylococcus lugdunensis NOT DETECTED NOT DETECTED Final   Streptococcus species DETECTED (A) NOT DETECTED Final    Comment: Not Enterococcus species, Streptococcus agalactiae, Streptococcus pyogenes, or Streptococcus pneumoniae. CRITICAL RESULT CALLED TO, READ BACK BY AND VERIFIED WITH: T GREEN,PHARMD@0730  09/04/21 [place]    Streptococcus agalactiae NOT DETECTED NOT DETECTED Final   Streptococcus pneumoniae NOT DETECTED NOT DETECTED Final   Streptococcus pyogenes NOT DETECTED NOT DETECTED Final   A.calcoaceticus-baumannii NOT DETECTED NOT DETECTED Final   Bacteroides fragilis NOT DETECTED NOT DETECTED Final   Enterobacterales NOT DETECTED NOT DETECTED Final   Enterobacter cloacae complex NOT DETECTED NOT DETECTED Final   Escherichia coli NOT DETECTED NOT DETECTED Final   Klebsiella aerogenes NOT DETECTED NOT DETECTED Final   Klebsiella oxytoca NOT DETECTED NOT DETECTED Final   Klebsiella pneumoniae NOT DETECTED NOT DETECTED Final   Proteus species NOT DETECTED NOT DETECTED Final   Salmonella species NOT DETECTED NOT DETECTED Final   Serratia marcescens NOT DETECTED NOT DETECTED Final   Haemophilus influenzae NOT DETECTED NOT DETECTED Final   Neisseria  meningitidis NOT DETECTED NOT DETECTED Final   Pseudomonas  aeruginosa NOT DETECTED NOT DETECTED Final   Stenotrophomonas maltophilia NOT DETECTED NOT DETECTED Final   Candida albicans NOT DETECTED NOT DETECTED Final   Candida auris NOT DETECTED NOT DETECTED Final   Candida glabrata NOT DETECTED NOT DETECTED Final   Candida krusei NOT DETECTED NOT DETECTED Final   Candida parapsilosis NOT DETECTED NOT DETECTED Final   Candida tropicalis NOT DETECTED NOT DETECTED Final   Cryptococcus neoformans/gattii NOT DETECTED NOT DETECTED Final    Comment: Performed at [facility], [place] [address] [place], [place] [address]  Urine Culture     Status: Abnormal   Collection Time: 09/03/21  2:51 PM   Specimen: In/Out Cath Urine  Result Value Ref Range Status   Specimen Description   Final    IN/OUT CATH URINE Performed at [facility] [address], [place], [place] [address]    Special Requests   Final    NONE Performed at [facility], [place] [address], [place], [place] [address]    Culture MULTIPLE SPECIES PRESENT, SUGGEST RECOLLECTION (A)  Final   Report Status 09/04/2021 FINAL  Final  MRSA Next Gen by PCR, Nasal     Status: Abnormal   Collection Time: 09/04/21  8:00 AM   Specimen: Nasal Mucosa; Nasal Swab  Result Value Ref Range Status   MRSA by PCR Next Gen DETECTED (A) NOT DETECTED Final    Comment: RESULT CALLED TO, READ BACK BY AND VERIFIED WITH: [place], J. RN ON 09/04/2021 @ 1225 BY MECIAL J. (NOTE) The GeneXpert MRSA Assay (FDA approved for NASAL specimens only), is one component of a comprehensive MRSA colonization surveillance program. It is not intended to diagnose MRSA infection nor to guide or monitor treatment for MRSA infections. Test performance is not FDA approved in patients less than 84 years old. Performed at [facility], [place] [address], [place], [place] [address]   Culture, Respiratory w Gram Stain     Status: None   Collection Time: 09/04/21  9:38 AM    Specimen: Tracheal Aspirate; Respiratory  Result Value Ref Range Status   Specimen Description   Final    TRACHEAL ASPIRATE Performed at [facility] [address], [place], [place] [address]    Special Requests   Final    NONE Performed at [facility], [place] [address] [place], [place] [address]    Gram Stain   Final    FEW WBC PRESENT, PREDOMINANTLY PMN RARE GRAM POSITIVE COCCI IN PAIRS    Culture   Final    RARE Normal respiratory flora-no Staph aureus or Pseudomonas seen Performed at [facility] [address], [place], [place] [address]    Report Status 09/06/2021 FINAL  Final  Culture, blood (Routine X 2) w Reflex to ID Panel     Status: None (Preliminary result)   Collection Time: 09/05/21 10:28 AM   Specimen: BLOOD  Result Value Ref Range Status   Specimen Description   Final    BLOOD BLOOD LEFT WRIST Performed at [place] [address], [place], [place] [address]    Special Requests   Final    BOTTLES DRAWN AEROBIC AND ANAEROBIC Blood Culture adequate volume Performed at [place] [address] [place], [place] [address]    Culture   Final    NO GROWTH 4 DAYS Performed at [facility], [address], [place], [place] [address]    Report Status PENDING  Incomplete  Culture, blood (Routine X 2) w Reflex to ID Panel     Status: None (Preliminary result)   Collection Time: 09/05/21 10:29 AM   Specimen: BLOOD  Result Value Ref Range Status   Specimen Description   Final    BLOOD BLOOD RIGHT HAND Performed at [place] [address], [place], [place] [address]    Special Requests   Final    BOTTLES DRAWN AEROBIC AND ANAEROBIC Blood Culture adequate volume Performed at [facility] [address], [place], [place] [address]    Culture   Final    NO GROWTH 4 DAYS Performed at [facility], [place] [address],  [place], [place] [address]    Report Status PENDING  Incomplete      Studies: No results found.   Flora Lipps, MD  [facility] 09/10/2021  If 7PM-7AM, please contact night-coverage

## 2021-09-10 NOTE — TOC Progression Note (Signed)
Transition of Care [facility]) - Progression Note    Patient Details  Name: [name] MRN: [id number] Date of Birth: 1956-08-13  Transition of [facility]) CM/SW Contact  Ross Ludwig, [place] Phone Number: 09/10/2021, 11:16 AM  Clinical Narrative:     CSW left message with administrator regarding patient returning back to SNF today.  CSW had to leave a message awaiting for a call back.   Expected Discharge Plan: [place] Barriers to Discharge: Continued Medical Work up  Expected Discharge Plan and Services Expected Discharge Plan: [place] arrangements for the past 2 months: [place]                                       Social Determinants of Health (SDOH) Interventions    Readmission Risk Interventions No flowsheet data found.

## 2021-09-10 NOTE — Progress Notes (Signed)
Physical Therapy Treatment Patient Details Name: [name] MRN: [id number] DOB: 01/16/1956 Today's Date: 09/10/2021   History of Present Illness 65 year old male with PMH which is significant for HFpEF, HTN, OSA, DM, chronic hypoxemic respiratory failure on 2L, and previous cellulitis of the lower extremity. Admitted now for sepsis2* L LE wound, AMS, and acute on chronic respiratory failure.    PT Comments    Pt progressing toward goals. Pleasant and cooperative, able to stand from elevated bed and perform stand pivot to chair with RW and 2 assist. Continue to recommend SNF post acute.  Pt following commands consistently, oriented to self, place, month, confused as to date/day of wk.  HR 88-120 max with activity Sats 92% on 2L RR 18-30-20 Continue PT in acute setting.   Recommendations for follow up therapy are one component of a multi-disciplinary discharge planning process, led by the attending physician.  Recommendations may be updated based on patient status, additional functional criteria and insurance authorization.  Follow Up Recommendations  Skilled nursing-short term rehab (<3 hours/day)     Assistance Recommended at Discharge Frequent or constant Supervision/Assistance  Equipment Recommendations  None recommended by PT    Recommendations for Other Services       Precautions / Restrictions Precautions Precautions: Fall Precaution Comments: Limited L hip and knee flex Restrictions Weight Bearing Restrictions: No     Mobility  Bed Mobility Overal bed mobility: Needs Assistance Bed Mobility: Supine to Sit     Supine to sit: Min assist;Mod assist;+2 for physical assistance;+2 for safety/equipment     General bed mobility comments: Use of bed rails with assist to manage LEs and to elevate trunk    Transfers Overall transfer level: Needs assistance Equipment used: Rolling walker (2 wheels) Transfers: Sit to/from Stand;Bed to chair/wheelchair/BSC Sit to  Stand: From elevated surface;Mod assist;+2 safety/equipment;+2 physical assistance     Step pivot transfers: +2 physical assistance;+2 safety/equipment;Mod assist     General transfer comment: significantly elevated bed ht to simulate home. initially performed partial stand, on second attempt pt able to come to full stand with assist for anterior-superior wt shift, to bring COG over BOS, cues to widen BOS. assist to maintain midline and prevent posterior LOB durin stand-step pivot, assist to control descdent    Ambulation/Gait               General Gait Details: deferred   Stairs             Wheelchair Mobility    Modified Rankin (Stroke Patients Only)       Balance Overall balance assessment: Needs assistance Sitting-balance support: Feet supported;Single extremity supported;No upper extremity supported Sitting balance-Leahy Scale: Fair Sitting balance - Comments: multi-moal cues for anterior wt shift/preventing posterior lean Postural control: Posterior lean Standing balance support: During functional activity;Reliant on assistive device for balance;Bilateral upper extremity supported Standing balance-Leahy Scale: Poor                              Cognition Arousal/Alertness: Awake/alert Behavior During Therapy: WFL for tasks assessed/performed Overall Cognitive Status: Within Functional Limits for tasks assessed                                          Exercises      General Comments        Pertinent Vitals/Pain  Pain Assessment: Faces Faces Pain Scale: Hurts little more Pain Location: LLE Pain Descriptors / Indicators: Grimacing;Sore Pain Intervention(s): Limited activity within patient's tolerance;Monitored during session;Repositioned    Home Living                          Prior Function            PT Goals (current goals can now be found in the care plan section) Acute Rehab PT Goals Patient Stated  Goal: Regain ability to ambulate PT Goal Formulation: With patient Time For Goal Achievement: 09/07/21 Potential to Achieve Goals: Fair Progress towards PT goals: Progressing toward goals    Frequency    Min 2X/week      PT Plan Current plan remains appropriate;Frequency needs to be updated    Co-evaluation              AM-PAC PT "6 Clicks" Mobility   Outcome Measure  Help needed turning from your back to your side while in a flat bed without using bedrails?: A Lot Help needed moving from lying on your back to sitting on the side of a flat bed without using bedrails?: A Lot Help needed moving to and from a bed to a chair (including a wheelchair)?: Total Help needed standing up from a chair using your arms (e.g., wheelchair or bedside chair)?: Total Help needed to walk in hospital room?: Total Help needed climbing 3-5 steps with a railing? : Total 6 Click Score: 8    End of Session Equipment Utilized During Treatment: Other (comment) (maxisky for return to bed) Activity Tolerance: Patient tolerated treatment well Patient left: in chair;with call bell/phone within reach;with chair alarm set Nurse Communication: Mobility status;Need for lift equipment PT Visit Diagnosis: Muscle weakness (generalized) (M62.81);Difficulty in walking, not elsewhere classified (R26.2);Pain Pain - Right/Left: Left Pain - part of body: Leg     Time: 9030-0923 PT Time Calculation (min) (ACUTE ONLY): 20 min  Charges:  $Therapeutic Activity: 8-22 mins                     Baxter Flattery, PT  Acute Rehab Dept [facility]) [phone] Pager [phone]  09/10/2021    [facility] 09/10/2021, 12:15 PM

## 2021-09-10 NOTE — TOC Transition Note (Signed)
Transition of Care [facility]) - CM/SW Discharge Note   Patient Details  Name: Patton Rabinovich MRN: [id number] Date of Birth: 04-29-1956  [facility] CM/SW Contact:  Ross Ludwig, LCSW Phone Number: 09/10/2021, 3:36 PM   Clinical Narrative:    CSW spoke to Nicole Kindred the Scientist, physiological at [facility], and he said patient can return back today.  CSW sent DC summary, therapy notes, and FL2 to SNF. Patient to be d/c'ed today to [facility].  Patient and family agreeable to plans will transport via ems RN to call report to [place] Nurse at [facility], [phone].  CSW spoke to patient's brother Evlyn Courier, and he is aware that patient is discharging today. '   Final next level of care: Skilled Nursing Facility Barriers to Discharge: Barriers Resolved   Patient Goals and CMS Choice Patient states their goals for this hospitalization and ongoing recovery are:: To return back to SNF CMS Medicare.gov Compare Post Acute Care list provided to:: Patient Choice offered to / list presented to : Patient  Discharge Placement   Existing PASRR number confirmed : 09/10/21          Patient [facility] bed at: [facility] Patient to be transferred to facility by: [facility] Name of family member notified: Patient's brother Evlyn Courier was notfied that patient is discharging today. Patient and family notified of of transfer: 09/10/21  Discharge Plan and Services                                     Social Determinants of Health (SDOH) Interventions     Readmission Risk Interventions No flowsheet data found.

## 2021-09-11 NOTE — Progress Notes (Signed)
Patient transport arrived. All personal belongings returned.

## 2021-10-08 ENCOUNTER — Other Ambulatory Visit: Payer: Self-pay

## 2021-10-08 ENCOUNTER — Encounter (HOSPITAL_BASED_OUTPATIENT_CLINIC_OR_DEPARTMENT_OTHER): Payer: Medicare Other | Attending: Internal Medicine | Admitting: Internal Medicine

## 2021-10-08 DIAGNOSIS — Z833 Family history of diabetes mellitus: Secondary | ICD-10-CM | POA: Insufficient documentation

## 2021-10-08 DIAGNOSIS — E1151 Type 2 diabetes mellitus with diabetic peripheral angiopathy without gangrene: Secondary | ICD-10-CM | POA: Insufficient documentation

## 2021-10-08 DIAGNOSIS — E11622 Type 2 diabetes mellitus with other skin ulcer: Secondary | ICD-10-CM | POA: Diagnosis not present

## 2021-10-08 DIAGNOSIS — Z794 Long term (current) use of insulin: Secondary | ICD-10-CM | POA: Diagnosis not present

## 2021-10-08 DIAGNOSIS — I890 Lymphedema, not elsewhere classified: Secondary | ICD-10-CM | POA: Insufficient documentation

## 2021-10-08 DIAGNOSIS — L97212 Non-pressure chronic ulcer of right calf with fat layer exposed: Secondary | ICD-10-CM | POA: Insufficient documentation

## 2021-10-08 DIAGNOSIS — L97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ed: Secondary | ICD-10-CM | POA: Insufficient documentation

## 2021-10-08 DIAGNOSIS — L97222 Non-pressure chronic ulcer of left calf with fat layer exposed: Secondary | ICD-10-CM | POA: Diagnosis not present

## 2021-10-08 DIAGNOSIS — I872 Venous insufficiency (chronic) (peripheral): Secondary | ICD-10-CM | POA: Insufficient documentation

## 2021-10-08 DIAGNOSIS — I87333 Chronic venous hypertension (idiopathic) with ulcer and inflammation of bilateral lower extremity: Secondary | ICD-10-CM | POA: Insufficient documentation

## 2021-10-08 DIAGNOSIS — I5032 Chronic diastolic (congestive) heart failure: Secondary | ICD-10-CM | POA: Diagnosis not present

## 2021-10-08 DIAGNOSIS — E1140 Type 2 diabetes mellitus with diabetic neuropathy, unspecified: Secondary | ICD-10-CM | POA: Insufficient documentation

## 2021-10-08 DIAGNOSIS — D631 Anemia in chronic kidney disease: Secondary | ICD-10-CM | POA: Diagnosis not present

## 2021-10-08 DIAGNOSIS — I110 Hypertensive heart disease with heart failure: Secondary | ICD-10-CM | POA: Insufficient documentation

## 2021-10-08 DIAGNOSIS — I2510 Atherosclerotic heart disease of native coronary artery without angina pectoris: Secondary | ICD-10-CM | POA: Diagnosis not present

## 2021-10-09 NOTE — Progress Notes (Signed)
[name], [name] ([id number]) Visit Report for 10/08/2021 Chief Complaint Document Details [name], [name] 10/08/2021 7:30 A M Medical Record Number: [id number] Patient Account Number: [id number] Date of Birth/Sex: Treating RN: 1955-11-09 (66 y.o. [name] Primary Care Provider: [name] Other Clinician: Referring Provider: Treating Provider/Extender: [name] in Treatment: 0 Information Obtained from: Patient Chief Complaint 10/08/2021; bilateral lower extremity wounds Electronic Signature(s) Signed: 10/08/2021 9:28:51 AM By: [name] DO Entered By: [name] on 10/08/2021 09:18:42 -------------------------------------------------------------------------------- HPI Details [name], [name] 10/08/2021 7:30 A M Medical Record Number: [id number] Patient Account Number: [id number] Date of Birth/Sex: Treating RN: 1956/06/03 (66 y.o. [name] Primary Care Provider: [name] Other Clinician: Referring Provider: Treating Provider/Extender: [name] in Treatment: 0 History of Present Illness HPI Description: 07/07/15; this is a patient who is in hospital on 8/2 through 8/4. He had cellulitis and abscess of predominantly I think the left leg. He received IV antibiotics. Plain x-ray showed no osteomyelitis. An MRI of the left leg did not show osteomyelitis. Cultures showed no predominant organism. His hemoglobin A1c was 9.8. He has a history of venous stasis also peripheral vascular disease. He was discharged to [place] home. He really has extensive ulcerations on the left lateral leg including a major wound that communicates both posteriorly and superiorly w. When drainage coming out of 2 smaller areas. He has a smaller wound on the posterior medial left leg. He has more predominantly venous insufficiency wounds on predominantly the  right medial leg above the ankle the right foot into sections. He has recently been put on Augmentin at the nursing home. Previous ABIs/arterial evaluation showed triphasic waves diffusely he does not have a major ischemic issue a left lower extremity venous duplex also exam showed no evidence of a DVT on 6/21 07/21/15; the patient arrives with really no major change. Culture I did last time was negative. He has not had a follow-up MRI I ordered. The wounds are macerated covered with a thick gelatinous surface slough. There is necrotic subcutaneous tissue 08/04/15; the patient arrives with a considerable improvement in the majority of his wound area. The area on the left leg now has what looks to be a granulated base. Most of the wounds on the left leg required an aggressive surgical debridement to remove nonviable fibrinous eschar and subcutaneous tissue however after debridement most of this looks better. Although I had asked for repeat MRI of the left leg when he first came in here with grossly purulent material coming out of his wounds, it does not appear that this is been done and nor do I actually feel that strongly about it right now. He has severe surrounding venous insufficiency and inflammation. I don't believe he has significant PAD 08/25/15. In general there is still a considerable wound area here but with still extensive service adherent slough. The patient will not allow mechanical debridement due to pain. He did not tolerate Medihoney therefore we are left with Santyl for now. She would appear that he has severe surrounding venous stasis. There is no evidence of the infection that may have had something to do with the pathogenesis of these wounds 09/29/15; patient still has substantial wound area on the left leg with a cluster of several wounds on the lateral leg confluently on the left leg posteriorly and then a substantial wound on the medial leg. On the right leg  a substantial wound  medially and a small area on the right lateral foot. All of these underwent a substantial surgical debridement with curettes which she tolerated better than he has in the past. This started as a complex cellulitis in the face of chronic venous insufficiency and inflammation. 10/13/15; substantial cluster of wounds on the lateral aspect of his left leg confluently around to the other side. Extensive surgical debridement to remove for redness surface slough nonviable subcutaneous tissue. This is not an improvement. Also substantial wound on the medial right leg which is largely unchanged. The etiology of this was felt to be a complex cellulitis in the summer of 2016 in the face of chronic venous insufficiency and inflammation. The patient states that the wound on the right medial leg has been there for years off and on. 10/20/15; we are able to start [facility] to these extensive wound areas left greater than right on Tuesday after I spoke to the wound care nurse at the facility. He arrives here today for extensive surgical debridement for the wounds on the lateral aspect of his left leg posterior left leg, this is almost circumferential. He has a large wound on the medial aspect of his right leg although he finds this too painful for debridement 10/27/15; I continue to bring this patient back for frequent debridement/weekly debridement severe. We have been using Hydrofera Blue. Unfortunately this has not really had any improvement. The debridement surgery difficult and painful for the patient 11/23/15; this patient spent a complex hospitalization admitted with acute kidney injury, anemia, cellulitis of the lower extremity, he was felt to have sepsis pathophysiology although his blood cultures were negative. He had plain x-rays of both legs that were negative for osseous abnormalities. He was seen by infectious disease and placed on a workup for vasculitis that was negative including a biopsy 4 all were  consistent with stasis dermatitis. Vital broad- spectrum antibiotics he continued to spike fevers. Urine and chest x-ray were negative Dopplers on admission ruled out a DVT All antibiotics were stopped on . 2/17. Since his return to [facility] skilled nursing facility I believe they have been using Xeroform 12/07/15 the patient arrives today with the area on his right medial leg actually looking quite stable. No debridement. The rest of his extensive wounds on the left lateral left posterior extending into the left medial leg all required extensive debridement. I think this is probably going to need to and up in the hands of plastic surgery. We'll attempt to change him back to [facility]. Arrange consultation with plastic surgery at [facility] for this almost circumferential wound on the left side 12/21/15 right leg covered in surface slough. This was debridement. Left leg extensive wounds all carefully examined. This is almost circumferential on the left side especially on the posterior calf. No debridement is necessary. 01/04/16 the patient has been to [facility] plastic surgery. Unfortunately it doesn't look like they had any of the prior workup on this patient. They're in the middle of vascular workup. It sounds as though they're applying Hydrofera Blue at the nursing home. 01/22/16; the patient has been back to see [facility]. There apparently making plans to possibly do skin grafts over his large venous insufficiency ulcerations. The patient tells me that he goes to hematology in [facility] and variably uses the term platelets red cells and the description of his problem. 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 I do not see any  major difference in the wound on the right medial leg and circumferentially across the left medial to left lateral leg. Did not attempt to debride these today. Readmission: 05/05/18 on evaluation today patient presents for  reevaluation here in our clinic although I have previously seen him in the skilled nursing facility over the past year and a half when I was working in the skilled nursing facility realm instead of covering in the clinic. With that being said during that time we had initiated most recently dressings with Hydrofera Blue Dressing along with the [employer] which had done very well for him. Much better than the Kerlex Coban wraps. With that being said the patient had been doing fairly well and was making progress when I last saw him. Upon evaluation today it appears that the wounds are actually a little bit worse than when I last saw him although definitely not dramatically so. There does not appear to be any evidence of infection which is good news. With that being said he has been tolerating the wraps without complication his left hip still gives him a lot of trouble which is his main issue as far as movement is concerned. Unbeknownst to me he has not had venous studies that I can find. He previously told me he had there were arterial studies noted back from 04/27/15 which showed that he had try phasic blood flow throughout and again his test appear to be completely normal as performed by Dr. Creig Hines. With that being said I cannot find where he had venous studies. The patient still states he thinks he did these definitely had no venous intervention at this point. Upon evaluation today it does not appear to me that the patient has any evidence of cellulitis. He does have some chronic venous insufficiency which I think has led to stasis dermatitis but again this is not appearing to be infected at this point. No fevers, chills, nausea, or vomiting noted at this time. READMISSION 06/30/2018 This is a now 66 year old man we have had in this clinic at multiple times in the past. Most recently he came in August and was seen by [facility] stone. It is not clear why he did not come back we asked him really did not  get a straight answer. He is at [facility] skilled facility he is a man who has severe chronic venous insufficiency with lymphedema and has had severe wounds on his left greater than right leg. We had him here in 2016 and 17 ultimately referred him to [facility]. He had arterial studies and venous studies done at [facility] although I am not able to access these records I see they are actually done. He also had multiple biopsies that were negative for malignancy showing changes compatible with chronic inflammation. As I understand things in [place] they are using Iodoflex and Unna boots. I am not really sure how they are getting enough Iodoflex for the large wound area especially on his left calf. Nevertheless the wounds look better than when I saw this in 2017. There were plans for him to have plastic surgery in 2018 and I think he was actually prepped for surgery however he was ultimately denied because the patient was an active smoker. The patient is not systemically unwell. The wounds are painful however given the size especially on the left this is not surprising. ABIs in this clinic were 0.78 on the left and 0.91 on the right 07/13/2018; this is a patient with bilateral  severe venous ulcers with secondary lymphedema. The wounds are on the right medial calf and a substantial part of the left posterior calf and some involvement medially and laterally on the left. We changed him to silver alginate under Unna boot's last time. The wounds actually look fairly satisfactory today. 08/14/2018; this is a patient with severe bilateral venous stasis ulcers with secondary lymphedema left greater than right he is at [facility] using silver alginate under Unna boot's I do not think there is much change on this visit versus last time I saw him a month ago Readmission: 11/04/18 patient presents today for follow-up concerning his bilateral lower extremity ulcers. His a right medial ankle ulcer and a left  leg that has ulcers over a large proportion of the surface area between the ankle and knee. Unfortunately this does cause some discomfort for the patient although it doesn't seem to be as uncomfortable as it has been in the past. He is seen today for evaluation after a referral by [place] back to the clinic. Unfortunately has a lot of Slough noted on the surface of his wounds. He's been treated currently it sounds like with Dakin's soaked gauze and they have not been performing the Unna Boot wrap that was previously recommended. I'm not exactly sure what the reason for the change was. He does have less slough on the surface of the wound but again this is something that is often a constant issue for him. Again he's had these wounds for many years. I've known him for two years at least during that time when I was taking care of them in the facility he is Artie had the wounds for several years prior. READMISSION 02/25/2020 [name] is a now 66 year old man. He has been in our clinic several times before most extensively in October 2016 through May 2017. At that time he had absolutely substantial bilateral lower extremity wounds secondary to chronic venous insufficiency and lymphedema. We ultimately referred him to [facility] he had a series of biopsies only showed suggestion of wound secondary to chronic venous insufficiency. He had a less extensive stay in our clinic in 2019 and then a single visit in February 2020. He is a resident at [facility] skilled facility. I think he has had intermittently had in facility wound care. He returns today. They are using silver alginate on the wounds although I am not sure what type of compression. Previously he has favored Unna boots. He is not felt to have an arterial issue. Past medical history; lymphedema and chronic venous insufficiency, diabetes mellitus, hypertension, congestive heart failure We did not do arterial studies today 04/27/2020 on evaluation patient  appears to be doing really about the same as when I have known him and seen him in the past. He does have wounds on the right and left legs at this point. Fortunately there is no signs of active infection at this time. 9/2; 1 month follow-up. This is a man with severe chronic venous insufficiency and lymphedema. When I first met him he had horrible circumferential bilateral lower extremity ulcers. We have been using silver alginate up until early last month when it was changed to [facility] and Unna boot's more or less says maintenance dressings. Since the last time he was here the facility where he resides [facility] called to report they could no longer do Unna boot so he is apparently only been receiving kerlix Coban the left leg is certainly a lot worse with almost circumferential large  wounds especially lateral but now medial and posterior as well. He has a standard same looking wound on the right medial lower leg. 10/1; absolutely no improvement here. In fact I do not think anything much is changed. The substantial area on his left lateral and left posterior calf is still open may be slightly larger. He has a more modest wound on the right medial calf. None of these look any different. He comes in with a kerlix Coban wrap this will simply not be adequate. He has too much edema in this leg He also is complaining a lot of pain in his left heel area. 11/8; potential wounds on the left lateral and posterior calf and a smaller oval wound in the right medial. We have been using Hydrofera Blue under 4-layer compression. He was using Unna boots still 2 months ago the facility called to say they can no longer place these so we have been using 4-layer compression. The surface area of the area on the left is so large there is very few alternatives to what we can use on this either silver alginate or Hydrofera Blue. He was complaining of pain on the left heel last time I asked for an x-ray this was  apparently done although we do not have the result. He is not complaining of pain today. 08/16/2020 on evaluation today patient is is seen for a early visit due to the fact that he apparently is having issues I was told when they called on Monday with a MRSA infection that they felt like we needed to see him for because his legs "looked horrible". With that being said based on what I see at this point it does not appear that the patient is actually having a terrible infection in fact compared to last time I saw him his legs do not appear to be doing too poorly at all at this time. Nonetheless he has been on doxycycline that is not going to be a good option for him based on the culture report that I have for review today which graded as a partial report with still several organisms pending as far as identification is concerned we did contact them but again they do not have the final report yet. Either way based on what we see it appears that doxycycline is one of the few medications that will not work for the Citrobacter. Obviously I think that a different medication would be a good option for him since there is a gram-negative organism pending I am likely can I suggest Cipro as the best of backup antibiotic to switch him to at this point. All this was discussed with the patient today. 12/20; not much change in the substantial wound on the left posterior calf and the small area on the right medial. He has a new area on the left anterior which looks like a wrap injury. Had some thoughts about doing a deep tissue culture here although we will put that off for next time. Using silver alginate as a primary dressing 2/10; substantial area on the left posterior calf. This measures smaller but still is a very large area. He has the area on the right anterior medial which also is a fairly large wound but much smaller than not on the left. His edema control is quite good. We have been using silver alginate  because of the size of the wounds. 4/7; substantial wound on the left posterior calf and a smaller area on the right medial lower leg.  These are very chronic wounds which we have classified is venous. Previously worked up at [facility] for 5 years ago at which time I had sent and will send him over for consideration of extensive skin graft I know they biopsied this many times but he never had plastic surgery. The wound area is much too large for standard size dressings we have been using silver alginate but we did give him a good trial last fall of Hydrofera Blue that did not make any difference either. If anything the area on the right medial lower leg over time has gotten a lot smaller as has the area on the left although it still quite substantial now. Readmission 10/08/2021 [name] is a 66 year old male with a past medical history of insulin-dependent type 2 diabetes with last hemoglobin A1c of 10.2, chronic venous insufficiency, lymphedema and chronic diastolic heart failure that presents to the clinic for bilateral lower extremity wounds. He has been followed in our clinic previously for the past 6 years for these issues. He was last seen in April 2022. He is inconsistent with his appointments in our clinic. These wounds have not healed over the past few years. He is currently using silver alginate to the wound beds and keeping these covered with Kerlix. He currently denies systemic signs of infection. He was recently hospitalized for septic shock secondary to bacteremia from likely chronic leg wounds And is currently on oral antibiotics to be completed this week. Electronic Signature(s) Signed: 10/08/2021 9:28:51 AM By: [name] DO Entered By: [name] on 10/08/2021 09:23:10 -------------------------------------------------------------------------------- Physical Exam Details [name], [name] 10/08/2021 7:30 A M Medical Record Number:  [id number] Patient Account Number: [id number] Date of Birth/Sex: Treating RN: 02-02-1956 (66 y.o. [name] Primary Care Provider: [name] Other Clinician: Referring Provider: Treating Provider/Extender: [name] in Treatment: 0 Constitutional respirations regular, non-labored and within target range for patient.Marland Kitchen Psychiatric pleasant and cooperative. Notes Extensive bilateral lower extremity wounds. Granulation tissue nonviable tissue throughout the wound beds. Lymphedema skin changes bilaterally. Pedal pulses not palpable. Electronic Signature(s) Signed: 10/08/2021 9:28:51 AM By: [name] DO Entered By: [name] on 10/08/2021 09:23:47 -------------------------------------------------------------------------------- Physician Orders Details [name], [name] 10/08/2021 7:30 A M Medical Record Number: [id number] Patient Account Number: [id number] Date of Birth/Sex: Treating RN: [birth year] (66 y.o. [name] Primary Care Provider: [name] Other Clinician: Referring Provider: Treating Provider/Extender: [name] in Treatment: 0 Verbal / Phone Orders: No Diagnosis Coding ICD-10 Coding Code Description [id number] Chronic venous hypertension (idiopathic) with ulcer and inflammation of bilateral lower extremity I89.0 Lymphedema, not elsewhere classified L97.222 Non-pressure chronic ulcer of left calf with fat layer exposed L97.212 Non-pressure chronic ulcer of right calf with fat layer exposed Follow-up Appointments ppointment in 2 weeks. - with Dr. Heber [place] **Extra Time 40 Minutes** Return A Bathing/ Shower/ Hygiene May shower and wash wound with soap and water. - when changing dressing Edema Control - Lymphedema / SCD / Other Elevate legs to the level of the heart or above for 30 minutes daily and/or when sitting, a frequency of: Moisturize legs  daily. Additional Orders / Instructions Follow Nutritious Diet - Monitor/Control Blood Sugars-Continue using ProStat Other: - We have sent referral to [facility] to obtain formal ABI/TBI before using any compression. Wound Treatment Wound #24 - Lower Leg Wound Laterality: Right, Anterior Cleanser: Anasept Antimicrobial Skin and Wound Cleanser, 8 (oz)  1 x Per Day/30 Days Discharge Instructions: Cleanse the wound with Anasept cleanser prior to applying a clean dressing using gauze sponges, not tissue or cotton balls. Peri-Wound Care: Zinc Oxide Ointment 30g tube 1 x Per Day/30 Days Discharge Instructions: Apply Zinc Oxide to periwound with each dressing change Prim Dressing: KerraCel Ag Gelling Fiber Dressing, 4x5 in (silver alginate) 1 x Per Day/30 Days ary Discharge Instructions: Apply silver alginate to wound bed as instructed Secondary Dressing: ABD Pad, 8x10 1 x Per Day/30 Days Discharge Instructions: Apply over primary dressing as directed. Secured With: [employer], 4.5x3.1 (in/yd) 1 x Per Day/30 Days Discharge Instructions: Secure with Kerlix as directed. Secured With: Transpore Surgical Tape, 2x10 (in/yd) 1 x Per Day/30 Days Discharge Instructions: Secure dressing with tape as directed. Wound #25 - Lower Leg Wound Laterality: Right, Medial Cleanser: Anasept Antimicrobial Skin and Wound Cleanser, 8 (oz) 1 x Per Day/30 Days Discharge Instructions: Cleanse the wound with Anasept cleanser prior to applying a clean dressing using gauze sponges, not tissue or cotton balls. Peri-Wound Care: Zinc Oxide Ointment 30g tube 1 x Per Day/30 Days Discharge Instructions: Apply Zinc Oxide to periwound with each dressing change Prim Dressing: KerraCel Ag Gelling Fiber Dressing, 4x5 in (silver alginate) 1 x Per Day/30 Days ary Discharge Instructions: Apply silver alginate to wound bed as instructed Secondary Dressing: ABD Pad, 8x10 1 x Per Day/30 Days Discharge Instructions:  Apply over primary dressing as directed. Secured With: [employer], 4.5x3.1 (in/yd) 1 x Per Day/30 Days Discharge Instructions: Secure with Kerlix as directed. Secured With: Transpore Surgical Tape, 2x10 (in/yd) 1 x Per Day/30 Days Discharge Instructions: Secure dressing with tape as directed. Wound #26 - Lower Leg Wound Laterality: Left, Posterior, Proximal Cleanser: Anasept Antimicrobial Skin and Wound Cleanser, 8 (oz) 1 x Per Day/30 Days Discharge Instructions: Cleanse the wound with Anasept cleanser prior to applying a clean dressing using gauze sponges, not tissue or cotton balls. Peri-Wound Care: Zinc Oxide Ointment 30g tube 1 x Per Day/30 Days Discharge Instructions: Apply Zinc Oxide to periwound with each dressing change Prim Dressing: KerraCel Ag Gelling Fiber Dressing, 4x5 in (silver alginate) 1 x Per Day/30 Days ary Discharge Instructions: Apply silver alginate to wound bed as instructed Secondary Dressing: ABD Pad, 8x10 1 x Per Day/30 Days Discharge Instructions: Apply over primary dressing as directed. Secured With: [employer], 4.5x3.1 (in/yd) 1 x Per Day/30 Days Discharge Instructions: Secure with Kerlix as directed. Secured With: Transpore Surgical Tape, 2x10 (in/yd) 1 x Per Day/30 Days Discharge Instructions: Secure dressing with tape as directed. Wound #27 - Lower Leg Wound Laterality: Left, Posterior Cleanser: Anasept Antimicrobial Skin and Wound Cleanser, 8 (oz) 1 x Per Day/30 Days Discharge Instructions: Cleanse the wound with Anasept cleanser prior to applying a clean dressing using gauze sponges, not tissue or cotton balls. Peri-Wound Care: Zinc Oxide Ointment 30g tube 1 x Per Day/30 Days Discharge Instructions: Apply Zinc Oxide to periwound with each dressing change Prim Dressing: KerraCel Ag Gelling Fiber Dressing, 4x5 in (silver alginate) 1 x Per Day/30 Days ary Discharge Instructions: Apply silver alginate to wound bed as instructed Secondary  Dressing: ABD Pad, 8x10 1 x Per Day/30 Days Discharge Instructions: Apply over primary dressing as directed. Secured With: [employer], 4.5x3.1 (in/yd) 1 x Per Day/30 Days Discharge Instructions: Secure with Kerlix as directed. Secured With: Transpore Surgical Tape, 2x10 (in/yd) 1 x Per Day/30 Days Discharge Instructions: Secure dressing with tape as directed. Services and Therapies nkle Brachial Index (ABI)/(TBI):  Bilateral - Referral to [facility] for formal ABI/TBI for chronic, non healing wounds ICD:L97.222 CPT - (ICD10 U31.497 : A - Non-pressure chronic ulcer of right calf with fat layer exposed) Electronic Signature(s) Signed: 10/08/2021 12:14:34 PM By: [name] DO Signed: 10/09/2021 3:58:04 PM By: Lorrin Jackson Previous Signature: 10/08/2021 9:28:51 AM Version By: [name] DO Entered By: Lorrin Jackson on 10/08/2021 09:42:32 Prescription 10/08/2021 -------------------------------------------------------------------------------- Balbi, Linton Ham DO Patient Name: Provider: 08/01/1956 [id number] Date of Birth: NPI#: [name] [id number] Sex: DEA #: [phone] Phone #: License #: [place] Patient Address: [name] [address] [place] [address] [place], [place] [address] [place], [place] [address] [phone] Allergies Sulfa (Sulfonamide Antibiotics) Provider's Orders nkle Brachial Index (ABI)/(TBI): Bilateral - ICD10: T70.017 - Referral to [facility] for formal ABI/TBI for chronic, non healing wounds ICD:L97.222 CPT: A Hand Signature: Date(s): Electronic Signature(s) Signed: 10/08/2021 12:14:34 PM By: [name] DO Signed: 10/09/2021 3:58:04 PM By: Lorrin Jackson Previous Signature: 10/08/2021 9:28:51 AM Version By: [name] DO Entered By: Lorrin Jackson on 10/08/2021 09:42:34 -------------------------------------------------------------------------------- Problem List  Details [name], [name] 10/08/2021 7:30 A M Medical Record Number: [id number] Patient Account Number: [id number] Date of Birth/Sex: Treating RN: 06/03/1956 (66 y.o. [name] Primary Care Provider: [name] Other Clinician: Referring Provider: Treating Provider/Extender: [name] in Treatment: 0 Active Problems ICD-10 Encounter Code Description Active Date MDM Diagnosis I87.333 Chronic venous hypertension (idiopathic) with ulcer and inflammation of 10/08/2021 No Yes bilateral lower extremity 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ed1/16/2023 No Yes L97.912 Non-pressure chronic ulcer of unspecified part of right lower leg with fat layer 10/08/2021 No Yes exposed I89.0 Lymphedema, not elsewhere classified 10/08/2021 No Yes Inactive Problems Resolved Problems Electronic Signature(s) Signed: 10/08/2021 9:28:51 AM By: [name] DO Entered By: [name] on 10/08/2021 09:18:09 -------------------------------------------------------------------------------- Progress Note Details [name], [name] 10/08/2021 7:30 A M Medical Record Number: [id number] Patient Account Number: [id number] Date of Birth/Sex: Treating RN: 29-Apr-1956 (66 y.o. [name] Primary Care Provider: [name] Other Clinician: Referring Provider: Treating Provider/Extender: [name] in Treatment: 0 Subjective Chief Complaint Information obtained from Patient 10/08/2021; bilateral lower extremity wounds History of Present Illness (HPI) 07/07/15; this is a patient who is in hospital on 8/2 through 8/4. He had cellulitis and abscess of predominantly I think the left leg. He received IV antibiotics. Plain x-ray showed no osteomyelitis. An MRI of the left leg did not show osteomyelitis. Cultures showed no predominant  organism. His hemoglobin A1c was 9.8. He has a history of venous stasis also peripheral vascular disease. He was discharged to [facility]. He really has extensive ulcerations on the left lateral leg including a major wound that communicates both posteriorly and superiorly w. When drainage coming out of 2 smaller areas. He has a smaller wound on the posterior medial left leg. He has more predominantly venous insufficiency wounds on predominantly the right medial leg above the ankle the right foot into sections. He has recently been put on Augmentin at the nursing home. Previous ABIs/arterial evaluation showed triphasic waves diffusely he does not have a major ischemic issue a left lower extremity venous duplex also exam showed no evidence of a DVT on 6/21 07/21/15; the patient arrives with really no major change. Culture I did last time was negative.  He has not had a follow-up MRI I ordered. The wounds are macerated covered with a thick gelatinous surface slough. There is necrotic subcutaneous tissue 08/04/15; the patient arrives with a considerable improvement in the majority of his wound area. The area on the left leg now has what looks to be a granulated base. Most of the wounds on the left leg required an aggressive surgical debridement to remove nonviable fibrinous eschar and subcutaneous tissue however after debridement most of this looks better. Although I had asked for repeat MRI of the left leg when he first came in here with grossly purulent material coming out of his wounds, it does not appear that this is been done and nor do I actually feel that strongly about it right now. He has severe surrounding venous insufficiency and inflammation. I don't believe he has significant PAD 08/25/15. In general there is still a considerable wound area here but with still extensive service adherent slough. The patient will not allow mechanical debridement due to pain. He did not tolerate  Medihoney therefore we are left with Santyl for now. She would appear that he has severe surrounding venous stasis. There is no evidence of the infection that may have had something to do with the pathogenesis of these wounds 09/29/15; patient still has substantial wound area on the left leg with a cluster of several wounds on the lateral leg confluently on the left leg posteriorly and then a substantial wound on the medial leg. On the right leg a substantial wound medially and a small area on the right lateral foot. All of these underwent a substantial surgical debridement with curettes which she tolerated better than he has in the past. This started as a complex cellulitis in the face of chronic venous insufficiency and inflammation. 10/13/15; substantial cluster of wounds on the lateral aspect of his left leg confluently around to the other side. Extensive surgical debridement to remove for redness surface slough nonviable subcutaneous tissue. This is not an improvement. Also substantial wound on the medial right leg which is largely unchanged. The etiology of this was felt to be a complex cellulitis in the summer of 2016 in the face of chronic venous insufficiency and inflammation. The patient states that the wound on the right medial leg has been there for years off and on. 10/20/15; we are able to start [facility] to these extensive wound areas left greater than right on Tuesday after I spoke to the wound care nurse at the facility. He arrives here today for extensive surgical debridement for the wounds on the lateral aspect of his left leg posterior left leg, this is almost circumferential. He has a large wound on the medial aspect of his right leg although he finds this too painful for debridement 10/27/15; I continue to bring this patient back for frequent debridement/weekly debridement severe. We have been using Hydrofera Blue. Unfortunately this has not really had any improvement. The  debridement surgery difficult and painful for the patient 11/23/15; this patient spent a complex hospitalization admitted with acute kidney injury, anemia, cellulitis of the lower extremity, he was felt to have sepsis pathophysiology although his blood cultures were negative. He had plain x-rays of both legs that were negative for osseous abnormalities. He was seen by infectious disease and placed on a workup for vasculitis that was negative including a biopsy o4 all were consistent with stasis dermatitis. Vital broad-spectrum antibiotics he continued to spike fevers. Urine and chest x-ray were negative Dopplers on admission ruled  out a DVT All antibiotics were stopped on 2/17. . Since his return to [facility] skilled nursing facility I believe they have been using Xeroform 12/07/15 the patient arrives today with the area on his right medial leg actually looking quite stable. No debridement. The rest of his extensive wounds on the left lateral left posterior extending into the left medial leg all required extensive debridement. I think this is probably going to need to and up in the hands of plastic surgery. We'll attempt to change him back to [facility]. Arrange consultation with plastic surgery at [facility] for this almost circumferential wound on the left side 12/21/15 right leg covered in surface slough. This was debridement. Left leg extensive wounds all carefully examined. This is almost circumferential on the left side especially on the posterior calf. No debridement is necessary. 01/04/16 the patient has been to [facility] plastic surgery. Unfortunately it doesn't look like they had any of the prior workup on this patient. They're in the middle of vascular workup. It sounds as though they're applying Hydrofera Blue at the nursing home. 01/22/16; the patient has been back to see [facility]. There apparently making plans to possibly do skin grafts over his large venous insufficiency ulcerations.  The patient tells me that he goes to hematology in [facility] and variably uses the term platelets red cells and the description of his problem. 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 I do not see any major difference in the wound on the right medial leg and circumferentially across the left medial to left lateral leg. Did not attempt to debride these today. Readmission: 05/05/18 on evaluation today patient presents for reevaluation here in our clinic although I have previously seen him in the skilled nursing facility over the past year and a half when I was working in the skilled nursing facility realm instead of covering in the clinic. With that being said during that time we had initiated most recently dressings with Hydrofera Blue Dressing along with the [employer] which had done very well for him. Much better than the Kerlex Coban wraps. With that being said the patient had been doing fairly well and was making progress when I last saw him. Upon evaluation today it appears that the wounds are actually a little bit worse than when I last saw him although definitely not dramatically so. There does not appear to be any evidence of infection which is good news. With that being said he has been tolerating the wraps without complication his left hip still gives him a lot of trouble which is his main issue as far as movement is concerned. Unbeknownst to me he has not had venous studies that I can find. He previously told me he had there were arterial studies noted back from 04/27/15 which showed that he had try phasic blood flow throughout and again his test appear to be completely normal as performed by Dr. Creig Hines. With that being said I cannot find where he had venous studies. The patient still states he thinks he did these definitely had no venous intervention at this point. Upon evaluation today it does not appear to me that the patient has any evidence  of cellulitis. He does have some chronic venous insufficiency which I think has led to stasis dermatitis but again this is not appearing to be infected at this point. No fevers, chills, nausea, or vomiting noted at this time. READMISSION 06/30/2018 This is  a now 66 year old man we have had in this clinic at multiple times in the past. Most recently he came in August and was seen by [facility] stone. It is not clear why he did not come back we asked him really did not get a straight answer. He is at [facility] skilled facility he is a man who has severe chronic venous insufficiency with lymphedema and has had severe wounds on his left greater than right leg. We had him here in 2016 and 17 ultimately referred him to [facility]. He had arterial studies and venous studies done at [facility] although I am not able to access these records I see they are actually done. He also had multiple biopsies that were negative for malignancy showing changes compatible with chronic inflammation. As I understand things in [place] they are using Iodoflex and Unna boots. I am not really sure how they are getting enough Iodoflex for the large wound area especially on his left calf. Nevertheless the wounds look better than when I saw this in 2017. There were plans for him to have plastic surgery in 2018 and I think he was actually prepped for surgery however he was ultimately denied because the patient was an active smoker. The patient is not systemically unwell. The wounds are painful however given the size especially on the left this is not surprising. ABIs in this clinic were 0.78 on the left and 0.91 on the right 07/13/2018; this is a patient with bilateral severe venous ulcers with secondary lymphedema. The wounds are on the right medial calf and a substantial part of the left posterior calf and some involvement medially and laterally on the left. We changed him to silver alginate under Unna boot's last time. The  wounds actually look fairly satisfactory today. 08/14/2018; this is a patient with severe bilateral venous stasis ulcers with secondary lymphedema left greater than right he is at [facility] using silver alginate under Unna boot's I do not think there is much change on this visit versus last time I saw him a month ago Readmission: 11/04/18 patient presents today for follow-up concerning his bilateral lower extremity ulcers. His a right medial ankle ulcer and a left leg that has ulcers over a large proportion of the surface area between the ankle and knee. Unfortunately this does cause some discomfort for the patient although it doesn't seem to be as uncomfortable as it has been in the past. He is seen today for evaluation after a referral by [place] back to the clinic. Unfortunately has a lot of Slough noted on the surface of his wounds. He's been treated currently it sounds like with Dakin's soaked gauze and they have not been performing the Unna Boot wrap that was previously recommended. I'm not exactly sure what the reason for the change was. He does have less slough on the surface of the wound but again this is something that is often a constant issue for him. Again he's had these wounds for many years. I've known him for two years at least during that time when I was taking care of them in the facility he is Artie had the wounds for several years prior. READMISSION 02/25/2020 [name] is a now 66 year old man. He has been in our clinic several times before most extensively in October 2016 through May 2017. At that time he had absolutely substantial bilateral lower extremity wounds secondary to chronic venous insufficiency and lymphedema. We ultimately referred him to [facility] he had a series  of biopsies only showed suggestion of wound secondary to chronic venous insufficiency. He had a less extensive stay in our clinic in 2019 and then a single visit in February 2020. He is a resident at [facility] skilled facility. I think he has had intermittently had in facility wound care. He returns today. They are using silver alginate on the wounds although I am not sure what type of compression. Previously he has favored Unna boots. He is not felt to have an arterial issue. Past medical history; lymphedema and chronic venous insufficiency, diabetes mellitus, hypertension, congestive heart failure We did not do arterial studies today 04/27/2020 on evaluation patient appears to be doing really about the same as when I have known him and seen him in the past. He does have wounds on the right and left legs at this point. Fortunately there is no signs of active infection at this time. 9/2; 1 month follow-up. This is a man with severe chronic venous insufficiency and lymphedema. When I first met him he had horrible circumferential bilateral lower extremity ulcers. We have been using silver alginate up until early last month when it was changed to [facility] and Unna boot's more or less says maintenance dressings. Since the last time he was here the facility where he resides [facility] called to report they could no longer do Unna boot so he is apparently only been receiving kerlix Coban the left leg is certainly a lot worse with almost circumferential large wounds especially lateral but now medial and posterior as well. He has a standard same looking wound on the right medial lower leg. 10/1; absolutely no improvement here. In fact I do not think anything much is changed. The substantial area on his left lateral and left posterior calf is still open may be slightly larger. He has a more modest wound on the right medial calf. None of these look any different. He comes in with a kerlix Coban wrap this will simply not be adequate. He has too much edema in this leg He also is complaining a lot of pain in his left heel area. 11/8; potential wounds on the left lateral and posterior calf and a smaller oval  wound in the right medial. We have been using Hydrofera Blue under 4-layer compression. He was using Unna boots still 2 months ago the facility called to say they can no longer place these so we have been using 4-layer compression. The surface area of the area on the left is so large there is very few alternatives to what we can use on this either silver alginate or Hydrofera Blue. He was complaining of pain on the left heel last time I asked for an x-ray this was apparently done although we do not have the result. He is not complaining of pain today. 08/16/2020 on evaluation today patient is is seen for a early visit due to the fact that he apparently is having issues I was told when they called on Monday with a MRSA infection that they felt like we needed to see him for because his legs "looked horrible". With that being said based on what I see at this point it does not appear that the patient is actually having a terrible infection in fact compared to last time I saw him his legs do not appear to be doing too poorly at all at this time. Nonetheless he has been on doxycycline that is not going to be a good option for him based  on the culture report that I have for review today which graded as a partial report with still several organisms pending as far as identification is concerned we did contact them but again they do not have the final report yet. Either way based on what we see it appears that doxycycline is one of the few medications that will not work for the Citrobacter. Obviously I think that a different medication would be a good option for him since there is a gram-negative organism pending I am likely can I suggest Cipro as the best of backup antibiotic to switch him to at this point. All this was discussed with the patient today. 12/20; not much change in the substantial wound on the left posterior calf and the small area on the right medial. He has a new area on the left anterior  which looks like a wrap injury. Had some thoughts about doing a deep tissue culture here although we will put that off for next time. Using silver alginate as a primary dressing 2/10; substantial area on the left posterior calf. This measures smaller but still is a very large area. He has the area on the right anterior medial which also is a fairly large wound but much smaller than not on the left. His edema control is quite good. We have been using silver alginate because of the size of the wounds. 4/7; substantial wound on the left posterior calf and a smaller area on the right medial lower leg. These are very chronic wounds which we have classified is venous. Previously worked up at [facility] for 5 years ago at which time I had sent and will send him over for consideration of extensive skin graft I know they biopsied this many times but he never had plastic surgery. The wound area is much too large for standard size dressings we have been using silver alginate but we did give him a good trial last fall of Hydrofera Blue that did not make any difference either. If anything the area on the right medial lower leg over time has gotten a lot smaller as has the area on the left although it still quite substantial now. Readmission 10/08/2021 [name] is a 66 year old male with a past medical history of insulin-dependent type 2 diabetes with last hemoglobin A1c of 10.2, chronic venous insufficiency, lymphedema and chronic diastolic heart failure that presents to the clinic for bilateral lower extremity wounds. He has been followed in our clinic previously for the past 6 years for these issues. He was last seen in April 2022. He is inconsistent with his appointments in our clinic. These wounds have not healed over the past few years. He is currently using silver alginate to the wound beds and keeping these covered with Kerlix. He currently denies systemic signs of infection. He was recently  hospitalized for septic shock secondary to bacteremia from likely chronic leg wounds And is currently on oral antibiotics to be completed this week. Patient History Information obtained from Patient. Allergies Sulfa (Sulfonamide Antibiotics) (Severity: Severe, Reaction: sweating,chils, body aches) Family History Diabetes - Mother,Father,Siblings, Hypertension - Mother,Father, No family history of Cancer, Heart Disease, Hereditary Spherocytosis, Kidney Disease, Lung Disease, Seizures, Stroke, Thyroid Problems, Tuberculosis. Social History Current every day smoker - 4 cig per day, Marital Status - Separated, Alcohol Use - Never, Drug Use - Prior History - cocaine, [place] quit 2005, Caffeine Use - Moderate. Medical History Eyes Denies history of Cataracts, Glaucoma, Optic Neuritis Ear/Nose/Mouth/Throat Denies history of Chronic sinus  problems/congestion, Middle ear problems Hematologic/Lymphatic Patient has history of Anemia - iron deficiency Denies history of Hemophilia, Human Immunodeficiency Virus, Lymphedema, Sickle Cell Disease Respiratory Patient has history of Sleep Apnea Denies history of Aspiration, Asthma, Chronic Obstructive Pulmonary Disease (COPD), Pneumothorax, Tuberculosis Cardiovascular Patient has history of Congestive Heart Failure, Coronary Artery Disease, Hypertension, Peripheral Venous Disease Denies history of Angina, Arrhythmia, Deep Vein Thrombosis, Hypotension, Myocardial Infarction, Peripheral Arterial Disease, Phlebitis, Vasculitis Gastrointestinal Denies history of Cirrhosis , Colitis, Crohnoos, Hepatitis A, Hepatitis B, Hepatitis C Endocrine Patient has history of Type II Diabetes - uncontrolled Denies history of Type I Diabetes Genitourinary Denies history of End Stage Renal Disease Immunological Denies history of Lupus Erythematosus, Raynaudoos, Scleroderma Integumentary (Skin) Denies history of History of Burn Musculoskeletal Patient has history of  Osteoarthritis Denies history of Gout, Rheumatoid Arthritis, Osteomyelitis Neurologic Patient has history of Neuropathy Denies history of Dementia, Quadriplegia, Paraplegia, Seizure Disorder Oncologic Denies history of Received Chemotherapy, Received Radiation Psychiatric Denies history of Anorexia/bulimia, Confinement Anxiety Hospitalization/Surgery History - inguinal hernia repair with mesh. - right shoulder rotator cuff repair. - toe nail excision. - Sepsis 09/03/21-09/10/22. Medical A Surgical History Notes nd Gastrointestinal GERD Musculoskeletal degenerative righ thip Psychiatric depression Objective Constitutional respirations regular, non-labored and within target range for patient.. Vitals Time Taken: 8:03 AM, Height: 77 in, Source: Stated, Weight: 258 lbs, Source: Stated, BMI: 30.6, Temperature: 98 F, Pulse: 66 bpm, Respiratory Rate: 20 breaths/min, Blood Pressure: 118/70 mmHg, Capillary Blood Glucose: 289 mg/dl. Psychiatric pleasant and cooperative. General Notes: Extensive bilateral lower extremity wounds. Granulation tissue nonviable tissue throughout the wound beds. Lymphedema skin changes bilaterally. Pedal pulses not palpable. Integumentary (Hair, Skin) Wound #24 status is Open. Original cause of wound was Gradually Appeared. The date acquired was: 09/07/2021. The wound is located on the Right,Anterior Lower Leg. The wound measures 12.8cm length x 22cm width x 0.1cm depth; 221.168cm^2 area and 22.117cm^3 volume. There is Fat Layer (Subcutaneous Tissue) exposed. There is no tunneling or undermining noted. There is a large amount of serous drainage noted. The wound margin is distinct with the outline attached to the wound base. There is large (67-100%) pink granulation within the wound bed. There is no necrotic tissue within the wound bed. Wound #25 status is Open. Original cause of wound was Gradually Appeared. The date acquired was: 09/07/2021. The wound is located  on the Right,Medial Lower Leg. The wound measures 5.1cm length x 3cm width x 0.3cm depth; 12.017cm^2 area and 3.605cm^3 volume. There is Fat Layer (Subcutaneous Tissue) exposed. There is no tunneling or undermining noted. There is a medium amount of serosanguineous drainage noted. The wound margin is thickened. There is large (67-100%) red granulation within the wound bed. There is a small (1-33%) amount of necrotic tissue within the wound bed including Adherent Slough. Wound #26 status is Open. Original cause of wound was Gradually Appeared. The date acquired was: 09/07/2021. The wound is located on the Left,Proximal,Posterior Lower Leg. The wound measures 7.5cm length x 13.5cm width x 0.3cm depth; 79.522cm^2 area and 23.856cm^3 volume. There is Fat Layer (Subcutaneous Tissue) exposed. There is no tunneling or undermining noted. There is a medium amount of serosanguineous drainage noted. The wound margin is distinct with the outline attached to the wound base. There is medium (34-66%) red granulation within the wound bed. There is a medium (34-66%) amount of necrotic tissue within the wound bed including Eschar and Adherent Slough. Wound #27 status is Open. Original cause of wound was Gradually Appeared. The date acquired was: 09/07/2021.  The wound is located on the Left,Posterior Lower Leg. The wound measures 20cm length x 16.5cm width x 0.3cm depth; 259.181cm^2 area and 77.754cm^3 volume. There is Fat Layer (Subcutaneous Tissue) exposed. There is no tunneling or undermining noted. There is a medium amount of serosanguineous drainage noted. The wound margin is distinct with the outline attached to the wound base. There is medium (34-66%) red granulation within the wound bed. There is a medium (34-66%) amount of necrotic tissue within the wound bed including Adherent Slough. 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 There are no signs of surrounding infection. I could not feel pedal pulses on exam and his ABIs in office were noncompressible. I recommended stat ABIs with TBI's. For now I recommended keeping the areas clean with either Vashe or Anasept wound cleanser followed by silver alginate. He can keep this covered with Kerlix. He will need some type of compression since it does appear that he has venous insufficiency and lymphedema. However I will need the results of the ABIs before doing this. We also had a discussion of the importance of glycemic control and wound healing. His last hemoglobin A1c was 10.2 and I discussed how this is delaying his ability to heal as well. He expressed understanding. Follow-up in 2 weeks. Plan Follow-up Appointments: Return Appointment in 2 weeks. - with Dr. Heber [place] Bathing/ Shower/ Hygiene: May shower and wash wound with soap and water. - when changing dressing Edema Control - Lymphedema / SCD / Other: Elevate legs to the level of the heart or above for 30 minutes daily and/or when sitting, a frequency of: Moisturize legs daily. Additional Orders / Instructions: Follow Nutritious Diet - Monitor/Control Blood Sugars-Continue using ProStat Other: - We have sent referral to [facility] to obtain formal ABI/TBI before using any compression. Services and Therapies ordered were: Ankle Brachial Index (ABI)/(TBI): Bilateral - Referral to [facility] for formal ABI/TBI for chronic, non healing wounds ICD:L97.222 CPT: WOUND #24: - Lower Leg Wound Laterality: Right, Anterior Cleanser: Anasept Antimicrobial Skin and Wound Cleanser, 8 (oz) 1 x Per  Day/30 Days Discharge Instructions: Cleanse the wound with Anasept cleanser prior to applying a clean dressing using gauze sponges, not tissue or cotton balls. Peri-Wound Care: Zinc Oxide Ointment 30g tube 1 x Per Day/30 Days Discharge Instructions: Apply Zinc Oxide to periwound with each dressing change Prim Dressing: KerraCel Ag Gelling Fiber Dressing, 4x5 in (silver alginate) 1 x Per Day/30 Days ary Discharge Instructions: Apply silver alginate to wound bed as instructed Secondary Dressing: ABD Pad, 8x10 1 x Per Day/30 Days Discharge Instructions: Apply over primary dressing as directed. Secured With: [employer], 4.5x3.1 (in/yd) 1 x Per Day/30 Days Discharge Instructions: Secure with Kerlix as directed. Secured With: [employer] T ape, 2x10 (in/yd) 1 x Per Day/30 Days Discharge Instructions: Secure dressing with tape as directed. WOUND #25: - Lower Leg Wound Laterality: Right, Medial Cleanser: Anasept Antimicrobial Skin and Wound Cleanser, 8 (oz) 1 x Per Day/30 Days Discharge Instructions: Cleanse the wound with Anasept cleanser prior to applying a clean dressing using gauze sponges, not tissue or cotton balls. Peri-Wound Care: Zinc Oxide Ointment 30g tube 1 x Per  Day/30 Days Discharge Instructions: Apply Zinc Oxide to periwound with each dressing change Prim Dressing: KerraCel Ag Gelling Fiber Dressing, 4x5 in (silver alginate) 1 x Per Day/30 Days ary Discharge Instructions: Apply silver alginate to wound bed as instructed Secondary Dressing: ABD Pad, 8x10 1 x Per Day/30 Days Discharge Instructions: Apply over primary dressing as directed. Secured With: [employer], 4.5x3.1 (in/yd) 1 x Per Day/30 Days Discharge Instructions: Secure with Kerlix as directed. Secured With: [employer] T ape, 2x10 (in/yd) 1 x Per Day/30 Days Discharge Instructions: Secure dressing with tape as directed. WOUND #26: - Lower Leg Wound Laterality: Left, Posterior,  Proximal Cleanser: Anasept Antimicrobial Skin and Wound Cleanser, 8 (oz) 1 x Per Day/30 Days Discharge Instructions: Cleanse the wound with Anasept cleanser prior to applying a clean dressing using gauze sponges, not tissue or cotton balls. Peri-Wound Care: Zinc Oxide Ointment 30g tube 1 x Per Day/30 Days Discharge Instructions: Apply Zinc Oxide to periwound with each dressing change Prim Dressing: KerraCel Ag Gelling Fiber Dressing, 4x5 in (silver alginate) 1 x Per Day/30 Days ary Discharge Instructions: Apply silver alginate to wound bed as instructed Secondary Dressing: ABD Pad, 8x10 1 x Per Day/30 Days Discharge Instructions: Apply over primary dressing as directed. Secured With: [employer], 4.5x3.1 (in/yd) 1 x Per Day/30 Days Discharge Instructions: Secure with Kerlix as directed. Secured With: [employer] T ape, 2x10 (in/yd) 1 x Per Day/30 Days Discharge Instructions: Secure dressing with tape as directed. WOUND #27: - Lower Leg Wound Laterality: Left, Posterior Cleanser: Anasept Antimicrobial Skin and Wound Cleanser, 8 (oz) 1 x Per Day/30 Days Discharge Instructions: Cleanse the wound with Anasept cleanser prior to applying a clean dressing using gauze sponges, not tissue or cotton balls. Peri-Wound Care: Zinc Oxide Ointment 30g tube 1 x Per Day/30 Days Discharge Instructions: Apply Zinc Oxide to periwound with each dressing change Prim Dressing: KerraCel Ag Gelling Fiber Dressing, 4x5 in (silver alginate) 1 x Per Day/30 Days ary Discharge Instructions: Apply silver alginate to wound bed as instructed Secondary Dressing: ABD Pad, 8x10 1 x Per Day/30 Days Discharge Instructions: Apply over primary dressing as directed. Secured With: [employer], 4.5x3.1 (in/yd) 1 x Per Day/30 Days Discharge Instructions: Secure with Kerlix as directed. Secured With: [employer] T ape, 2x10 (in/yd) 1 x Per Day/30 Days Discharge Instructions: Secure dressing with tape  as directed. 1. Silver alginate 2. Stat ABIs with TBI's 3. Follow-up in 2 weeks Electronic Signature(s) Signed: 10/08/2021 9:28:51 AM By: [name] DO Entered By: [name] on 10/08/2021 09:27:56 -------------------------------------------------------------------------------- HxROS Details [name], [name] 10/08/2021 7:30 A M Medical Record Number: [id number] Patient Account Number: [id number] Date of Birth/Sex: Treating RN: 05-28-1956 (66 y.o. [name] Primary Care Provider: [name] Other Clinician: Referring Provider: Treating Provider/Extender: [name] in Treatment: 0 Information Obtained From Patient Eyes Medical History: Negative for: Cataracts; Glaucoma; Optic Neuritis Ear/Nose/Mouth/Throat Medical History: Negative for: Chronic sinus problems/congestion; Middle ear problems Hematologic/Lymphatic Medical History: Positive for: Anemia - iron deficiency Negative for: Hemophilia; Human Immunodeficiency Virus; Lymphedema; Sickle Cell Disease Respiratory Medical History: Positive for: Sleep Apnea Negative for: Aspiration; Asthma; Chronic Obstructive Pulmonary Disease (COPD); Pneumothorax; Tuberculosis Cardiovascular Medical History: Positive for: Congestive Heart Failure; Coronary Artery Disease; Hypertension; Peripheral Venous Disease Negative for: Angina; Arrhythmia; Deep Vein Thrombosis; Hypotension; Myocardial Infarction; Peripheral Arterial Disease; Phlebitis; Vasculitis Gastrointestinal Medical History: Negative for: Cirrhosis ; Colitis; Crohns; Hepatitis A; Hepatitis B; Hepatitis C Past Medical History  Notes: GERD Endocrine Medical History: Positive for: Type II Diabetes - uncontrolled Negative for: Type I Diabetes Time with diabetes: 1999 Treated with: Insulin Blood sugar tested every day: Yes T ested : 3x per day Blood sugar testing results: Breakfast: 81-200; Lunch:  180-210; Dinner: 200 Genitourinary Medical History: Negative for: End Stage Renal Disease Immunological Medical History: Negative for: Lupus Erythematosus; Raynauds; Scleroderma Integumentary (Skin) Medical History: Negative for: History of Burn Musculoskeletal Medical History: Positive for: Osteoarthritis Negative for: Gout; Rheumatoid Arthritis; Osteomyelitis Past Medical History Notes: degenerative righ thip Neurologic Medical History: Positive for: Neuropathy Negative for: Dementia; Quadriplegia; Paraplegia; Seizure Disorder Oncologic Medical History: Negative for: Received Chemotherapy; Received Radiation Psychiatric Medical History: Negative for: Anorexia/bulimia; Confinement Anxiety Past Medical History Notes: depression Immunizations Pneumococcal Vaccine: Received Pneumococcal Vaccination: Yes Received Pneumococcal Vaccination On or After 60th Birthday: No Implantable Devices None Hospitalization / Surgery History Type of Hospitalization/Surgery inguinal hernia repair with mesh right shoulder rotator cuff repair toe nail excision Sepsis 09/03/21-09/10/22 Family and Social History Cancer: No; Diabetes: Yes - Mother,Father,Siblings; Heart Disease: No; Hereditary Spherocytosis: No; Hypertension: Yes - Mother,Father; Kidney Disease: No; Lung Disease: No; Seizures: No; Stroke: No; Thyroid Problems: No; Tuberculosis: No; Current every day smoker - 4 cig per day; Marital Status - Separated; Alcohol Use: Never; Drug Use: Prior History - cocaine, [place] quit 2005; Caffeine Use: Moderate; Financial Concerns: No; Food, Clothing or Shelter Needs: No; Support System Lacking: No; Transportation Concerns: No Electronic Signature(s) Signed: 10/08/2021 9:28:51 AM By: [name] DO Signed: 10/09/2021 3:29:02 PM By: Dellie Catholic RN Entered By: Dellie Catholic on 10/08/2021 08:16:47 -------------------------------------------------------------------------------- [place]  Details [name], [name] 10/08/2021 Medical Record Number: [id number] Patient Account Number: [id number] Date of Birth/Sex: Treating RN: [birth year] (66 y.o. [name] Primary Care Provider: [name] Other Clinician: Referring Provider: Treating Provider/Extender: [name] in Treatment: 0 Diagnosis Coding ICD-10 Codes Code Description [phone] Chronic venous hypertension (idiopathic) with ulcer and inflammation of bilateral lower extremity I89.0 Lymphedema, not elsewhere classified L97.222 Non-pressure chronic ulcer of left calf with fat layer exposed L97.212 Non-pressure chronic ulcer of right calf with fat layer exposed Facility Procedures CPT4 Code: [id number] Description: [place] VISIT-LEV 5 EST PT Modifier: 25 Quantity: 1 Physician Procedures : CPT4 Code Description Modifier [id number] - WC PHYS LEVEL 3 - EST PT ICD-10 Diagnosis Description I87.333 Chronic venous hypertension (idiopathic) with ulcer and inflammation of bilateral lower extremity I89.0 Lymphedema, not elsewhere classified  L97.222 Non-pressure chronic ulcer of left calf with fat layer exposed L97.212 Non-pressure chronic ulcer of right calf with fat layer exposed Quantity: 1 Electronic Signature(s) Signed: 10/08/2021 9:28:51 AM By: [name] DO Entered By: [name] on 10/08/2021 09:28:17

## 2021-10-09 NOTE — Progress Notes (Signed)
Sybert, Kieren ([id number]) Visit Report for 10/08/2021 Allergy List Details [name], [name] 10/08/2021 7:30 A M Medical Record Number: [id number] Patient Account Number: [id number] Date of Birth/Sex: Treating RN: [birth year] (66 y.o. [name] Primary Care Teshara Moree: [name] Other Clinician: Referring Esme Freund: Treating Amanada Philbrick/Extender: [name] in Treatment: 0 Allergies Active Allergies Sulfa (Sulfonamide Antibiotics) Reaction: sweating,chils, body aches Severity: Severe Allergy Notes Electronic Signature(s) Signed: 10/09/2021 3:29:02 PM By: Dellie Catholic RN Entered By: Dellie Catholic on 10/08/2021 08:13:29 -------------------------------------------------------------------------------- Arrival Information Details [name], [name] 10/08/2021 7:30 A M Medical Record Number: [id number] Patient Account Number: [id number] Date of Birth/Sex: Treating RN: 05-07-1956 (66 y.o. [name] Primary Care Kenedee Molesky: [name] Other Clinician: Referring Chandra Asher: Treating Harshika Mago/Extender: [name] in Treatment: 0 Visit Information Patient Arrived: Wheel Chair Arrival Time: 08:00 Accompanied By: caregiver Transfer Assistance: Manual Patient Identification Verified: Yes Patient Requires Transmission-Based No Precautions: Patient Has Alerts: Yes Patient Alerts: Patient on Blood Thinner *NO BLOOD PRODUCTS* History Since Last Visit Added or deleted any medications: No Any new allergies or adverse reactions: No Had a fall or experienced change in activities of daily living that may affect risk of falls: No Signs or symptoms of abuse/neglect since last visito No Hospitalized since last visit: No Implantable device outside of the clinic excluding cellular tissue based products placed in the center since last visit: No Notes Pt. on  Aspirin Electronic Signature(s) Signed: 10/09/2021 3:29:02 PM By: Dellie Catholic RN Entered By: Dellie Catholic on 10/08/2021 08:12:54 -------------------------------------------------------------------------------- [name], [name] 10/08/2021 7:30 A M Medical Record Number: [id number] Patient Account Number: [id number] Date of Birth/Sex: Treating RN: [birth year] (66 y.o. [name] Primary Care Aviance Cooperwood: [name] Other Clinician: Referring Foy Vanduyne: Treating Hyman Crossan/Extender: [name] in Treatment: 0 Clinic Level of Care Assessment Items TOOL 2 Quantity Score X- 1 0 Use when only an EandM is performed on the INITIAL visit ASSESSMENTS - Nursing Assessment / Reassessment X- 1 20 General Physical Exam (combine w/ comprehensive assessment (listed just below) when performed on new pt. evals) X- 1 25 Comprehensive Assessment (HX, ROS, Risk Assessments, Wounds Hx, etc.) ASSESSMENTS - Wound and Skin A ssessment / Reassessment []  - 0 Simple Wound Assessment / Reassessment - one wound X- 4 5 Complex Wound Assessment / Reassessment - multiple wounds []  - 0 Dermatologic / Skin Assessment (not related to wound area) ASSESSMENTS - Ostomy and/or Continence Assessment and Care []  - 0 Incontinence Assessment and Management []  - 0 Ostomy Care Assessment and Management (repouching, etc.) PROCESS - Coordination of Care []  - 0 Simple Patient / Family Education for ongoing care X- 1 20 Complex (extensive) Patient / Family Education for ongoing care X- 1 10 Staff obtains Programmer, systems, Records, T Results / Process Orders est X- 1 10 Staff telephones HHA, Nursing Homes / Clarify orders / etc []  - 0 Routine Transfer to another Facility (non-emergent condition) []  - 0 Routine Hospital Admission (non-emergent condition) []  - 0 New Admissions / Biomedical engineer / Ordering NPWT  Apligraf, etc. , []  - 0 Emergency Hospital Admission (emergent condition) []  - 0 Simple Discharge Coordination []  - 0 Complex (extensive) Discharge Coordination PROCESS - Special Needs []  - 0 Pediatric / Minor Patient Management []  - 0 Isolation Patient Management []  - 0 Hearing / Language / Visual special needs []  - 0  Assessment of Community assistance (transportation, D/C planning, etc.) []  - 0 Additional assistance / Altered mentation []  - 0 Support Surface(s) Assessment (bed, cushion, seat, etc.) INTERVENTIONS - Wound Cleansing / Measurement X- 1 5 Wound Imaging (photographs - any number of wounds) []  - 0 Wound Tracing (instead of photographs) []  - 0 Simple Wound Measurement - one wound X- 4 5 Complex Wound Measurement - multiple wounds []  - 0 Simple Wound Cleansing - one wound X- 4 5 Complex Wound Cleansing - multiple wounds INTERVENTIONS - Wound Dressings []  - 0 Small Wound Dressing one or multiple wounds []  - 0 Medium Wound Dressing one or multiple wounds X- 2 20 Large Wound Dressing one or multiple wounds []  - 0 Application of Medications - injection INTERVENTIONS - Miscellaneous []  - 0 External ear exam []  - 0 Specimen Collection (cultures, biopsies, blood, body fluids, etc.) []  - 0 Specimen(s) / Culture(s) sent or taken to Lab for analysis []  - 0 Patient Transfer (multiple staff / Civil Service fast streamer / Similar devices) []  - 0 Simple Staple / Suture removal (25 or less) []  - 0 Complex Staple / Suture removal (26 or more) []  - 0 Hypo / Hyperglycemic Management (close monitor of Blood Glucose) X- 1 15 Ankle / Brachial Index (ABI) - do not check if billed separately Has the patient been seen at the hospital within the last three years: Yes Total Score: 205 Level Of Care: New/Established - Level 5 Electronic Signature(s) Signed: 10/09/2021 3:29:02 PM By: Dellie Catholic RN Entered By: Dellie Catholic on 10/08/2021  09:15:27 -------------------------------------------------------------------------------- Encounter Discharge Information Details [name], [name] 10/08/2021 7:30 A M Medical Record Number: [id number] Patient Account Number: [id number] Date of Birth/Sex: Treating RN: [birth year] (66 y.o. [name] Primary Care Jamarrion Budai: [name] Other Clinician: Referring Deanndra Kirley: Treating Kavya Haag/Extender: [name] in Treatment: 0 Encounter Discharge Information Items Discharge Condition: Stable Ambulatory Status: Wheelchair Discharge Destination: [place] Orders Sent: Yes Transportation: Other Schedule Follow-up Appointment: Yes Clinical Summary of Care: Provided on 10/08/2021 Form Type Recipient Paper Patient Patient/Facility Electronic Signature(s) Signed: 10/08/2021 9:43:50 AM By: Lorrin Jackson Entered By: Lorrin Jackson on 10/08/2021 09:43:49 -------------------------------------------------------------------------------- Lower Extremity Assessment Details [name], [name] 10/08/2021 7:30 A M Medical Record Number: [id number] Patient Account Number: [id number] Date of Birth/Sex: Treating RN: [birth year] (66 y.o. [name] Primary Care Keaghan Bowens: [name] Other Clinician: Referring Ercelle Winkles: Treating Kiosha Buchan/Extender: [name] in Treatment: 0 Edema Assessment Assessed: Shirlyn Goltz: Yes] Patrice Paradise: Yes] Edema: [Left: Yes] [Right: Yes] Calf Left: Right: Point of Measurement: 37 cm From Medial Instep 42 cm 40.9 cm Ankle Left: Right: Point of Measurement: 12 cm From Medial Instep 28 cm 28.5 cm Vascular Assessment Pulses: Dorsalis Pedis Palpable: [Left:No] [Right:No] Doppler Audible: [Left:Yes] [Right:Yes] Blood Pressure: Brachial: [Left:118] [Right:118] Ankle: [Left:Dorsalis Pedis: 160 1.36] [Right:Dorsalis Pedis: 170  1.44] Electronic Signature(s) Signed: 10/09/2021 3:29:02 PM By: Dellie Catholic RN Entered By: Dellie Catholic on 10/08/2021 08:52:55 -------------------------------------------------------------------------------- Multi Wound Chart Details [name], [name] 10/08/2021 7:30 A M Medical Record Number: [id number] Patient Account Number: [id number] Date of Birth/Sex: Treating RN: 1956-02-22 (66 y.o. [name] Primary Care Kimberlin Scheel: [name] Other Clinician: Referring Javeion Cannedy: Treating Mackenna Kamer/Extender: [name] in Treatment: 0 Vital Signs Height(in): 77 Capillary Blood Glucose(mg/dl): 289 Weight(lbs): 258 Pulse(bpm): 66 Body Mass Index(BMI): 31 Blood Pressure(mmHg): 118/70 Temperature(F): 98 Respiratory Rate(breaths/min): 20 Photos: [24:Right, Anterior  Lower Leg] [25:Right, Medial Lower Leg] [26:Left, Proximal, Posterior Lower Leg] Wound Location: [24:Gradually Appeared] [25:Gradually Appeared] [26:Gradually Appeared] Wounding Event: [24:Venous Leg Ulcer] [25:Venous Leg Ulcer] [26:Venous Leg Ulcer] Primary Etiology: [24:Anemia, Sleep Apnea, Congestive] [25:Anemia, Sleep Apnea, Congestive] [26:Anemia, Sleep Apnea, Congestive] Comorbid History: [24:Heart Failure, Coronary Artery Disease, Hypertension, Peripheral Venous Disease, Type II Diabetes, Osteoarthritis, Neuropathy 09/07/2021] [25:Heart Failure, Coronary Artery Disease, Hypertension, Peripheral Venous Disease, Type II  Diabetes, Osteoarthritis, Neuropathy 09/07/2021] [26:Heart Failure, Coronary Artery Disease, Hypertension, Peripheral Venous Disease, Type II Diabetes, Osteoarthritis, Neuropathy 09/07/2021] Date Acquired: [24:0] [25:0] [26:0] Weeks of Treatment: [24:Open] [25:Open] [26:Open] Wound Status: [24:12.8x22x0.1] [25:5.1x3x0.3] [26:7.5x13.5x0.3] Measurements L x W x D (cm) [24:221.168] [25:12.017] [26:79.522] A (cm) : rea [24:22.117] [25:3.605]  [26:23.856] Volume (cm) : [24:0.00%] [25:0.00%] [26:0.00%] % Reduction in Area: [24:0.00%] [25:0.00%] [26:0.00%] % Reduction in Volume: [24:Full Thickness Without Exposed] [25:Full Thickness Without Exposed] [26:Full Thickness Without Exposed] Classification: [24:Support Structures Large] [25:Support Structures Medium] [26:Support Structures Medium] Exudate A mount: [24:Serous] [25:Serosanguineous] [26:Serosanguineous] Exudate Type: [24:amber] [25:red, brown] [26:red, brown] Exudate Color: [24:Distinct, outline attached] [25:Thickened] [26:Distinct, outline attached] Wound Margin: [24:Large (67-100%)] [25:Large (67-100%)] [26:Medium (34-66%)] Granulation Amount: [24:Pink] [25:Red] [26:Red] Granulation Quality: [24:None Present (0%)] [25:Small (1-33%)] [26:Medium (34-66%)] Necrotic Amount: [24:N/A] [25:Adherent Slough] [26:Eschar, Adherent Slough] Necrotic Tissue: [24:Fat Layer (Subcutaneous Tissue): Yes Fat Layer (Subcutaneous Tissue): Yes Fat Layer (Subcutaneous Tissue): Yes] Exposed Structures: [24:Fascia: No Tendon: No Muscle: No Joint: No Bone: No None] [25:Fascia: No Tendon: No Muscle: No Joint: No Bone: No None] [26:Fascia: No Tendon: No Muscle: No Joint: No Bone: No None] Wound Number: 27 N/A N/A Photos: N/A N/A Left, Posterior Lower Leg N/A N/A Wound Location: Gradually Appeared N/A N/A Wounding Event: Venous Leg Ulcer N/A N/A Primary Etiology: Anemia, Sleep Apnea, Congestive N/A N/A Comorbid History: Heart Failure, Coronary Artery Disease, Hypertension, Peripheral Venous Disease, Type II Diabetes, Osteoarthritis, Neuropathy 09/07/2021 N/A N/A Date Acquired: 0 N/A N/A Weeks of Treatment: Open N/A N/A Wound Status: 20x16.5x0.3 N/A N/A Measurements L x W x D (cm) 259.181 N/A N/A A (cm) : rea 77.754 N/A N/A Volume (cm) : 0.00% N/A N/A % Reduction in Area: 0.00% N/A N/A % Reduction in Volume: Full Thickness Without Exposed N/A N/A Classification: Support  Structures Medium N/A N/A Exudate A mount: Serosanguineous N/A N/A Exudate Type: red, brown N/A N/A Exudate Color: Distinct, outline attached N/A N/A Wound Margin: Medium (34-66%) N/A N/A Granulation Amount: Red N/A N/A Granulation Quality: Medium (34-66%) N/A N/A Necrotic Amount: Adherent Slough N/A N/A Necrotic Tissue: Fat Layer (Subcutaneous Tissue): Yes N/A N/A Exposed Structures: Fascia: No Tendon: No Muscle: No Joint: No Bone: No None N/A N/A Epithelialization: Treatment Notes Electronic Signature(s) Signed: 10/08/2021 9:28:51 AM By: [name] DO Signed: 10/09/2021 3:58:04 PM By: Lorrin Jackson Signed: 10/09/2021 3:58:04 PM By: Lorrin Jackson Entered By: [name] on 10/08/2021 09:18:15 -------------------------------------------------------------------------------- Multi-Disciplinary Care Plan Details [name], [name] 10/08/2021 7:30 A M Medical Record Number: [id number] Patient Account Number: [id number] Date of Birth/Sex: Treating RN: 06/25/1956 (66 y.o. [name] Primary Care Rikia Sukhu: [name] Other Clinician: Referring Russ Looper: Treating Adalene Gulotta/Extender: [name] in Treatment: 0 Active Inactive Venous Leg Ulcer Nursing Diagnoses: Actual venous Insuffiency (use after diagnosis is confirmed) Goals: Patient will maintain optimal edema control Date Initiated: 10/08/2021 Target Resolution Date: 11/05/2021 Goal Status: Active Interventions: Compression as ordered Provide education on venous insufficiency Treatment Activities: Therapeutic compression applied : 10/08/2021 Notes: Wound/Skin Impairment Nursing Diagnoses: Impaired tissue integrity Goals: Patient/caregiver  will verbalize understanding of skin care regimen Date Initiated: 10/08/2021 Target Resolution Date: 11/05/2021 Goal Status: Active Ulcer/skin breakdown will have a volume reduction of 30% by week  4 Date Initiated: 10/08/2021 Target Resolution Date: 11/05/2021 Goal Status: Active Interventions: Assess patient/caregiver ability to perform ulcer/skin care regimen upon admission and as needed Assess ulceration(s) every visit Provide education on ulcer and skin care Treatment Activities: Topical wound management initiated : 10/08/2021 Notes: Electronic Signature(s) Signed: 10/09/2021 3:29:02 PM By: Dellie Catholic RN Entered By: Dellie Catholic on 10/08/2021 08:21:42 -------------------------------------------------------------------------------- Pain Assessment Details [name], [name] 10/08/2021 7:30 A M Medical Record Number: [id number] Patient Account Number: [id number] Date of Birth/Sex: Treating RN: Sep 28, 1955 (66 y.o. [name] Primary Care Beadie Matsunaga: [name] Other Clinician: Referring Vanya Carberry: Treating Rishik Tubby/Extender: [name] in Treatment: 0 Active Problems Location of Pain Severity and Description of Pain Patient Has Paino Yes Site Locations Pain Location: Pain in Ulcers With Dressing Change: Yes Duration of the Pain. Constant / Intermittento Intermittent Rate the pain. Current Pain Level: 3 Character of Pain Describe the Pain: Burning, Throbbing Pain Management and Medication Current Pain Management: Medication: Yes Cold Application: No Rest: Yes Massage: No Activity: No T.E.N.S.: No Heat Application: No Leg drop or elevation: No Is the Current Pain Management Adequate: Adequate How does your wound impact your activities of daily livingo Sleep: No Bathing: No Appetite: No Relationship With Others: No Bladder Continence: No Emotions: No Bowel Continence: No Work: No Toileting: No Drive: No Dressing: No Hobbies: No Electronic Signature(s) Signed: 10/09/2021 3:29:02 PM By: Dellie Catholic RN Entered By: Dellie Catholic on 10/08/2021  08:20:42 -------------------------------------------------------------------------------- Patient/Caregiver Education Details Patient Name: Date of Service: Orma Flaming RD, [name] 1/16/2023andnbsp7:30 A M Medical Record Number: [id number] Patient Account Number: [id number] Date of Birth/Gender: Treating RN: July 28, 1956 (66 y.o. [name] Primary Care Physician: [name] Other Clinician: Referring Physician: Treating Physician/Extender: [name] in Treatment: 0 Education Assessment Education Provided To: Patient and Caregiver Facility Education Topics Provided Venous: Methods: Explain/Verbal, Printed Responses: State content correctly Wound/Skin Impairment: Methods: Explain/Verbal, Printed Responses: State content correctly Electronic Signature(s) Signed: 10/09/2021 3:29:02 PM By: Dellie Catholic RN Entered By: Dellie Catholic on 10/08/2021 08:22:01 -------------------------------------------------------------------------------- Wound Assessment Details [name], [name] 10/08/2021 7:30 A M Medical Record Number: [id number] Patient Account Number: [id number] Date of Birth/Sex: Treating RN: [birth year] (66 y.o. [name] Primary Care Neta Upadhyay: [name] Other Clinician: Referring Kyelle Urbas: Treating Antion Andres/Extender: [name] in Treatment: 0 Wound Status Wound Number: 24 Primary Venous Leg Ulcer Etiology: Wound Location: Right, Anterior Lower Leg Wound Open Wounding Event: Gradually Appeared Status: Date Acquired: 09/07/2021 Comorbid Anemia, Sleep Apnea, Congestive Heart Failure, Coronary Artery Weeks Of Treatment: 0 History: Disease, Hypertension, Peripheral Venous Disease, Type II Clustered Wound: No Diabetes, Osteoarthritis, Neuropathy Photos Wound Measurements Length: (cm) 12.8 Width: (cm) 22 Depth: (cm) 0.1 Area: (cm) 221.168 Volume: (cm) 22.117 %  Reduction in Area: 0% % Reduction in Volume: 0% Epithelialization: None Tunneling: No Undermining: No Wound Description Classification: Full Thickness Without Exposed Support Structures Wound Margin: Distinct, outline attached Exudate Amount: Large Exudate Type: Serous Exudate Color: amber Foul Odor After Cleansing: No Slough/Fibrino No Wound Bed Granulation Amount: Large (67-100%) Exposed Structure Granulation Quality: Pink Fascia Exposed: No Necrotic Amount: None Present (0%) Fat Layer (Subcutaneous Tissue) Exposed: Yes Tendon Exposed: No Muscle Exposed: No Joint Exposed: No Bone Exposed: No Treatment Notes Wound #24 (Lower  Leg) Wound Laterality: Right, Anterior Cleanser Anasept Antimicrobial Skin and Wound Cleanser, 8 (oz) Discharge Instruction: Cleanse the wound with Anasept cleanser prior to applying a clean dressing using gauze sponges, not tissue or cotton balls. Peri-Wound Care Zinc Oxide Ointment 30g tube Discharge Instruction: Apply Zinc Oxide to periwound with each dressing change Topical Primary Dressing KerraCel Ag Gelling Fiber Dressing, 4x5 in (silver alginate) Discharge Instruction: Apply silver alginate to wound bed as instructed Secondary Dressing ABD Pad, 8x10 Discharge Instruction: Apply over primary dressing as directed. Secured With [employer], 4.5x3.1 (in/yd) Discharge Instruction: Secure with Kerlix as directed. Transpore Surgical Tape, 2x10 (in/yd) Discharge Instruction: Secure dressing with tape as directed. Compression Wrap Compression Stockings Add-Ons Electronic Signature(s) Signed: 10/09/2021 3:29:02 PM By: Dellie Catholic RN Signed: 10/09/2021 3:58:04 PM By: Lorrin Jackson Entered By: Dellie Catholic on 10/08/2021 08:29:19 -------------------------------------------------------------------------------- Wound Assessment Details [name], [name] 10/08/2021 7:30 A M Medical Record Number:  [id number] Patient Account Number: [id number] Date of Birth/Sex: Treating RN: 1956-01-12 (66 y.o. [name] Primary Care Lakota Markgraf: [name] Other Clinician: Referring Crandall Harvel: Treating Kimberla Driskill/Extender: [name] in Treatment: 0 Wound Status Wound Number: 25 Primary Venous Leg Ulcer Etiology: Wound Location: Right, Medial Lower Leg Wound Open Wounding Event: Gradually Appeared Status: Date Acquired: 09/07/2021 Comorbid Anemia, Sleep Apnea, Congestive Heart Failure, Coronary Artery Weeks Of Treatment: 0 History: Disease, Hypertension, Peripheral Venous Disease, Type II Clustered Wound: No Diabetes, Osteoarthritis, Neuropathy Photos Wound Measurements Length: (cm) 5.1 Width: (cm) 3 Depth: (cm) 0.3 Area: (cm) 12.017 Volume: (cm) 3.605 % Reduction in Area: 0% % Reduction in Volume: 0% Epithelialization: None Tunneling: No Undermining: No Wound Description Classification: Full Thickness Without Exposed Support Structures Wound Margin: Thickened Exudate Amount: Medium Exudate Type: Serosanguineous Exudate Color: red, brown Foul Odor After Cleansing: No Slough/Fibrino Yes Wound Bed Granulation Amount: Large (67-100%) Exposed Structure Granulation Quality: Red Fascia Exposed: No Necrotic Amount: Small (1-33%) Fat Layer (Subcutaneous Tissue) Exposed: Yes Necrotic Quality: Adherent Slough Tendon Exposed: No Muscle Exposed: No Joint Exposed: No Bone Exposed: No Treatment Notes Wound #25 (Lower Leg) Wound Laterality: Right, Medial Cleanser Anasept Antimicrobial Skin and Wound Cleanser, 8 (oz) Discharge Instruction: Cleanse the wound with Anasept cleanser prior to applying a clean dressing using gauze sponges, not tissue or cotton balls. Peri-Wound Care Zinc Oxide Ointment 30g tube Discharge Instruction: Apply Zinc Oxide to periwound with each dressing change Topical Primary Dressing KerraCel Ag Gelling Fiber Dressing, 4x5  in (silver alginate) Discharge Instruction: Apply silver alginate to wound bed as instructed Secondary Dressing ABD Pad, 8x10 Discharge Instruction: Apply over primary dressing as directed. Secured With [employer], 4.5x3.1 (in/yd) Discharge Instruction: Secure with Kerlix as directed. Transpore Surgical Tape, 2x10 (in/yd) Discharge Instruction: Secure dressing with tape as directed. Compression Wrap Compression Stockings Add-Ons Electronic Signature(s) Signed: 10/09/2021 3:29:02 PM By: Dellie Catholic RN Signed: 10/09/2021 3:58:04 PM By: Lorrin Jackson Entered By: Dellie Catholic on 10/08/2021 08:30:42 -------------------------------------------------------------------------------- Wound Assessment Details [name], [name] 10/08/2021 7:30 A M Medical Record Number: [id number] Patient Account Number: [id number] Date of Birth/Sex: Treating RN: 24-Oct-1955 (66 y.o. [name] Primary Care Tarsha Blando: [name] Other Clinician: Referring Tammie Yanda: Treating Yesenia Fontenette/Extender: [name] in Treatment: 0 Wound Status Wound Number: 26 Primary Venous Leg Ulcer Etiology: Wound Location: Left, Proximal, Posterior Lower Leg Wound Open Wounding Event: Gradually Appeared Status: Date Acquired: 09/07/2021 Comorbid Anemia, Sleep Apnea, Congestive Heart Failure, Coronary Artery Weeks  Of Treatment: 0 History: Disease, Hypertension, Peripheral Venous Disease, Type II Clustered Wound: No Diabetes, Osteoarthritis, Neuropathy Photos Wound Measurements Length: (cm) 7.5 Width: (cm) 13.5 Depth: (cm) 0.3 Area: (cm) 79.522 Volume: (cm) 23.856 % Reduction in Area: 0% % Reduction in Volume: 0% Epithelialization: None Tunneling: No Undermining: No Wound Description Classification: Full Thickness Without Exposed Support Structures Wound Margin: Distinct, outline attached Exudate Amount: Medium Exudate Type:  Serosanguineous Exudate Color: red, brown Foul Odor After Cleansing: No Slough/Fibrino Yes Wound Bed Granulation Amount: Medium (34-66%) Exposed Structure Granulation Quality: Red Fascia Exposed: No Necrotic Amount: Medium (34-66%) Fat Layer (Subcutaneous Tissue) Exposed: Yes Necrotic Quality: Eschar, Adherent Slough Tendon Exposed: No Muscle Exposed: No Joint Exposed: No Bone Exposed: No Treatment Notes Wound #26 (Lower Leg) Wound Laterality: Left, Posterior, Proximal Cleanser Anasept Antimicrobial Skin and Wound Cleanser, 8 (oz) Discharge Instruction: Cleanse the wound with Anasept cleanser prior to applying a clean dressing using gauze sponges, not tissue or cotton balls. Peri-Wound Care Zinc Oxide Ointment 30g tube Discharge Instruction: Apply Zinc Oxide to periwound with each dressing change Topical Primary Dressing KerraCel Ag Gelling Fiber Dressing, 4x5 in (silver alginate) Discharge Instruction: Apply silver alginate to wound bed as instructed Secondary Dressing ABD Pad, 8x10 Discharge Instruction: Apply over primary dressing as directed. Secured With [employer], 4.5x3.1 (in/yd) Discharge Instruction: Secure with Kerlix as directed. Transpore Surgical Tape, 2x10 (in/yd) Discharge Instruction: Secure dressing with tape as directed. Compression Wrap Compression Stockings Add-Ons Electronic Signature(s) Signed: 10/09/2021 3:29:02 PM By: Dellie Catholic RN Signed: 10/09/2021 3:58:04 PM By: Lorrin Jackson Entered By: Dellie Catholic on 10/08/2021 08:31:43 -------------------------------------------------------------------------------- Wound Assessment Details [name], [name] 10/08/2021 7:30 A M Medical Record Number: [id number] Patient Account Number: [id number] Date of Birth/Sex: Treating RN: 1956/09/07 (66 y.o. [name] Primary Care Krystle Oberman: [name] Other Clinician: Referring Blaise Grieshaber: Treating  Shedric Fredericks/Extender: [name] in Treatment: 0 Wound Status Wound Number: 27 Primary Venous Leg Ulcer Etiology: Wound Location: Left, Posterior Lower Leg Wound Open Wounding Event: Gradually Appeared Status: Date Acquired: 09/07/2021 Comorbid Anemia, Sleep Apnea, Congestive Heart Failure, Coronary Artery Weeks Of Treatment: 0 History: Disease, Hypertension, Peripheral Venous Disease, Type II Clustered Wound: No Diabetes, Osteoarthritis, Neuropathy Photos Wound Measurements Length: (cm) 20 Width: (cm) 16.5 Depth: (cm) 0.3 Area: (cm) 259.181 Volume: (cm) 77.754 Wound Description Classification: Full Thickness Without Exposed Support Structu Wound Margin: Distinct, outline attached Exudate Amount: Medium Exudate Type: Serosanguineous Exudate Color: red, brown Foul Odor After Cleansing: Slough/Fibrino % Reduction in Area: 0% % Reduction in Volume: 0% Epithelialization: None Tunneling: No Undermining: No res No Yes Wound Bed Granulation Amount: Medium (34-66%) Exposed Structure Granulation Quality: Red Fascia Exposed: No Necrotic Amount: Medium (34-66%) Fat Layer (Subcutaneous Tissue) Exposed: Yes Necrotic Quality: Adherent Slough Tendon Exposed: No Muscle Exposed: No Joint Exposed: No Bone Exposed: No Treatment Notes Wound #27 (Lower Leg) Wound Laterality: Left, Posterior Cleanser Anasept Antimicrobial Skin and Wound Cleanser, 8 (oz) Discharge Instruction: Cleanse the wound with Anasept cleanser prior to applying a clean dressing using gauze sponges, not tissue or cotton balls. Peri-Wound Care Zinc Oxide Ointment 30g tube Discharge Instruction: Apply Zinc Oxide to periwound with each dressing change Topical Primary Dressing KerraCel Ag Gelling Fiber Dressing, 4x5 in (silver alginate) Discharge Instruction: Apply silver alginate to wound bed as instructed Secondary Dressing ABD Pad, 8x10 Discharge Instruction: Apply over primary  dressing as directed. Secured With [employer], 4.5x3.1 (in/yd) Discharge Instruction: Secure with Kerlix as directed. Transpore  Surgical Tape, 2x10 (in/yd) Discharge Instruction: Secure dressing with tape as directed. Compression Wrap Compression Stockings Add-Ons Electronic Signature(s) Signed: 10/09/2021 3:29:02 PM By: Dellie Catholic RN Signed: 10/09/2021 3:58:04 PM By: Lorrin Jackson Entered By: Dellie Catholic on 10/08/2021 08:32:40 -------------------------------------------------------------------------------- Vitals Details [name], [name] 10/08/2021 7:30 A M Medical Record Number: [id number] Patient Account Number: [id number] Date of Birth/Sex: Treating RN: Jun 18, 1956 (66 y.o. [name] Primary Care Erling Arrazola: [name] Other Clinician: Referring Merrisa Skorupski: Treating Aul Mangieri/Extender: [name] in Treatment: 0 Vital Signs Time Taken: 08:03 Temperature (F): 98 Height (in): 77 Pulse (bpm): 66 Source: Stated Respiratory Rate (breaths/min): 20 Weight (lbs): 258 Blood Pressure (mmHg): 118/70 Source: Stated Capillary Blood Glucose (mg/dl): 289 Body Mass Index (BMI): 30.6 Reference Range: 80 - 120 mg / dl Electronic Signature(s) Signed: 10/09/2021 3:29:02 PM By: Dellie Catholic RN Entered By: Dellie Catholic on 10/08/2021 08:11:35

## 2021-10-09 NOTE — Progress Notes (Signed)
[name], [name] ([id number]) Visit Report for 10/08/2021 Abuse/Suicide Risk Screen Details [name], [name] 10/08/2021 7:30 A M Medical Record Number: [id number] Patient Account Number: [id number] Date of Birth/Sex: Treating RN: 12-27-1955 (66 y.o. [name] Primary Care Hawkins Seaman: [name] Other Clinician: Referring Filip Luten: Treating Yahira Timberman/Extender: [name] in Treatment: 0 Abuse/Suicide Risk Screen Items Answer ABUSE RISK SCREEN: Has anyone close to you tried to hurt or harm you recentlyo No Do you feel uncomfortable with anyone in your familyo No Has anyone forced you do things that you didnt want to doo No Electronic Signature(s) Signed: 10/09/2021 3:29:02 PM By: Dellie Catholic RN Entered By: Dellie Catholic on 10/08/2021 08:16:53 -------------------------------------------------------------------------------- Activities of Daily Living Details [name], [name] 10/08/2021 7:30 A M Medical Record Number: [id number] Patient Account Number: [id number] Date of Birth/Sex: Treating RN: [birth year] (66 y.o. [name] Primary Care Makeshia Seat: [name] Other Clinician: Referring Adaleen Hulgan: Treating Chance Munter/Extender: [name] in Treatment: 0 Activities of Daily Living Items Answer Activities of Daily Living (Please select one for each item) Drive Automobile Not Able T Medications ake Need Assistance Use T elephone Completely Able Care for Appearance Need Assistance Use T oilet Need Assistance Bath / Shower Need Assistance Dress Self Need Assistance Feed Self Need Assistance Walk Need Assistance Get In / Out Bed Need Assistance Housework Not Able Prepare Meals Not Able Handle Money Need Assistance Shop for Self Not Able Electronic Signature(s) Signed: 10/09/2021 3:29:02 PM By: Dellie Catholic RN Entered By: Dellie Catholic on 10/08/2021  08:17:36 -------------------------------------------------------------------------------- Education Screening Details [name], [name] 10/08/2021 7:30 A M Medical Record Number: [id number] Patient Account Number: [id number] Date of Birth/Sex: Treating RN: 12-Oct-1955 (65 y.o. [name] Primary Care Tobin Cadiente: [name] Other Clinician: Referring Jemmie Rhinehart: Treating Nyala Kirchner/Extender: [name] in Treatment: 0 Primary Learner Assessed: Patient Learning Preferences/Education Level/Primary Language Learning Preference: Explanation, Demonstration, Printed Material Highest Education Level: High School Preferred Language: English Cognitive Barrier Language Barrier: No Translator Needed: No Memory Deficit: No Emotional Barrier: No Cultural/Religious Beliefs Affecting Medical Care: No Physical Barrier Impaired Vision: No Impaired Hearing: No Decreased Hand dexterity: No Knowledge/Comprehension Knowledge Level: Medium Comprehension Level: High Ability to understand written instructions: High Ability to understand verbal instructions: High Motivation Anxiety Level: Calm Cooperation: Cooperative Education Importance: Acknowledges Need Interest in Health Problems: Asks Questions Perception: Coherent Willingness to Engage in Self-Management High Activities: Readiness to Engage in Self-Management High Activities: Electronic Signature(s) Signed: 10/09/2021 3:29:02 PM By: Dellie Catholic RN Entered By: Dellie Catholic on 10/08/2021 08:18:16 -------------------------------------------------------------------------------- Fall Risk Assessment Details [name], [name] 10/08/2021 7:30 A M Medical Record Number: [id number] Patient Account Number: [id number] Date of Birth/Sex: Treating RN: November 23, 1955 (65 y.o. [name] Primary Care Rajendra Spiller: [name] Other  Clinician: Referring Arshad Oberholzer: Treating Hagan Maltz/Extender: [name] in Treatment: 0 Fall Risk Assessment Items Have you had 2 or more falls in the last 12 monthso 0 Yes Have you had any fall that resulted in injury in the last 12 monthso 0 No FALLS RISK SCREEN History of falling - immediate or within 3 months 0 No Secondary diagnosis (Do you have 2 or more medical diagnoseso) 15 Yes Ambulatory aid None/bed rest/wheelchair/nurse 0 No Crutches/cane/walker 15 Yes Furniture 0 No Intravenous therapy Access/Saline/Heparin Lock 0 No Gait/Transferring Normal/ bed rest/ wheelchair 0 No Weak (short steps with  or without shuffle, stooped but able to lift head while walking, may seek 10 Yes support from furniture) Impaired (short steps with shuffle, may have difficulty arising from chair, head down, impaired 0 No balance) Mental Status Oriented to own ability 0 Yes Electronic Signature(s) Signed: 10/09/2021 3:29:02 PM By: Dellie Catholic RN Entered By: Dellie Catholic on 10/08/2021 08:19:47 -------------------------------------------------------------------------------- Foot Assessment Details [name], [name] 10/08/2021 7:30 [place] Record Number: [id number] Patient Account Number: [id number] Date of Birth/Sex: Treating RN: 04/11/1956 (66 y.o. [name] Primary Care Marzetta Lanza: [name] Other Clinician: Referring Ocean Kearley: Treating Tarell Schollmeyer/Extender: [name] in Treatment: 0 Foot Assessment Items Site Locations + = Sensation present, - = Sensation absent, C = Callus, U = Ulcer R = Redness, W = Warmth, M = Maceration, PU = Pre-ulcerative lesion F = Fissure, S = Swelling, D = Dryness Assessment Right: Left: Other Deformity: No No Prior Foot Ulcer: No No Prior Amputation: No No Charcot Joint: No No Ambulatory Status: Ambulatory With Help Assistance Device: Wheelchair Gait:  Steady Electronic Signature(s) Signed: 10/09/2021 3:29:02 PM By: Dellie Catholic RN Entered By: Dellie Catholic on 10/08/2021 08:24:01 -------------------------------------------------------------------------------- Nutrition Risk Screening Details [name], [name] 10/08/2021 7:30 A M Medical Record Number: [id number] Patient Account Number: [id number] Date of Birth/Sex: Treating RN: 09-Dec-1955 (66 y.o. [name] Primary Care Gwenevere Goga: [name] Other Clinician: Referring Ananth Fiallos: Treating Carrol Bondar/Extender: [name] in Treatment: 0 Height (in): 77 Weight (lbs): 258 Body Mass Index (BMI): 30.6 Nutrition Risk Screening Items Score Screening NUTRITION RISK SCREEN: I have an illness or condition that made me change the kind and/or amount of food I eat 0 No I eat fewer than two meals per day 0 No I eat few fruits and vegetables, or milk products 0 No I have three or more drinks of beer, liquor or wine almost every day 0 No I have tooth or mouth problems that make it hard for me to eat 0 No I don't always have enough money to buy the food I need 0 No I eat alone most of the time 0 No I take three or more different prescribed or over-the-counter drugs a day 1 Yes Without wanting to, I have lost or gained 10 pounds in the last six months 0 No I am not always physically able to shop, cook and/or feed myself 0 No Nutrition Protocols Good Risk Protocol 0 No interventions needed Moderate Risk Protocol High Risk Proctocol Risk Level: Good Risk Score: 1 Electronic Signature(s) Signed: 10/09/2021 3:29:02 PM By: Dellie Catholic RN Entered By: Dellie Catholic on 10/08/2021 08:20:01

## 2021-10-10 ENCOUNTER — Other Ambulatory Visit (HOSPITAL_COMMUNITY): Payer: Self-pay | Admitting: Internal Medicine

## 2021-10-10 DIAGNOSIS — L97222 Non-pressure chronic ulcer of left calf with fat layer exposed: Secondary | ICD-10-CM

## 2021-10-11 ENCOUNTER — Ambulatory Visit (HOSPITAL_COMMUNITY)
Admission: RE | Admit: 2021-10-11 | Discharge: 2021-10-11 | Disposition: A | Discharge status: Home / Self Care | Payer: Medicare Other | Source: Ambulatory Visit | Attending: Internal Medicine | Admitting: Internal Medicine

## 2021-10-11 ENCOUNTER — Other Ambulatory Visit: Payer: Self-pay

## 2021-10-11 DIAGNOSIS — L97222 Non-pressure chronic ulcer of left calf with fat layer exposed: Secondary | ICD-10-CM | POA: Diagnosis not present

## 2021-10-22 ENCOUNTER — Other Ambulatory Visit: Payer: Self-pay

## 2021-10-22 ENCOUNTER — Encounter (HOSPITAL_BASED_OUTPATIENT_CLINIC_OR_DEPARTMENT_OTHER): Payer: Medicare Other | Admitting: Internal Medicine

## 2021-10-22 DIAGNOSIS — E11622 Type 2 diabetes mellitus with other skin ulcer: Secondary | ICD-10-CM | POA: Diagnosis not present

## 2021-10-22 DIAGNOSIS — L97222 Non-pressure chronic ulcer of left calf with fat layer exposed: Secondary | ICD-10-CM

## 2021-10-22 DIAGNOSIS — I87333 Chronic venous hypertension (idiopathic) with ulcer and inflammation of bilateral lower extremity: Secondary | ICD-10-CM

## 2021-10-22 DIAGNOSIS — L97212 Non-pressure chronic ulcer of right calf with fat layer exposed: Secondary | ICD-10-CM

## 2021-10-22 DIAGNOSIS — I890 Lymphedema, not elsewhere classified: Secondary | ICD-10-CM

## 2021-10-22 NOTE — Progress Notes (Signed)
Azzara, Deivi ([id number]) Visit Report for 10/22/2021 Chief Complaint Document Details [name], [name] 10/22/2021 9:45 A M Medical Record Number: [id number] Patient Account Number: [id number] Date of Birth/Sex: Treating RN: June 03, 1956 (66 y.o. [name] Primary Care Provider: [name] Other Clinician: Referring Provider: Treating Provider/Extender: [name] in Treatment: 2 Information Obtained from: Patient Chief Complaint 10/08/2021; bilateral lower extremity wounds Electronic Signature(s) Signed: 10/22/2021 12:39:32 PM By: [name] DO Entered By: [name] on 10/22/2021 12:32:19 -------------------------------------------------------------------------------- HPI Details [name], [name] 10/22/2021 9:45 A M Medical Record Number: [id number] Patient Account Number: [id number] Date of Birth/Sex: Treating RN: 05/27/1956 (66 y.o. [name] Primary Care Provider: [name] Other Clinician: Referring Provider: Treating Provider/Extender: [name] in Treatment: 2 History of Present Illness HPI Description: 07/07/15; this is a patient who is in hospital on 8/2 through 8/4. He had cellulitis and abscess of predominantly I think the left leg. He received IV antibiotics. Plain x-ray showed no osteomyelitis. An MRI of the left leg did not show osteomyelitis. Cultures showed no predominant organism. His hemoglobin A1c was 9.8. He has a history of venous stasis also peripheral vascular disease. He was discharged to [facility]. He really has extensive ulcerations on the left lateral leg including a major wound that communicates both posteriorly and superiorly w. When drainage coming out of 2 smaller areas. He has a smaller wound on the posterior medial left leg. He has more predominantly venous insufficiency wounds on predominantly  the right medial leg above the ankle the right foot into sections. He has recently been put on Augmentin at the nursing home. Previous ABIs/arterial evaluation showed triphasic waves diffusely he does not have a major ischemic issue a left lower extremity venous duplex also exam showed no evidence of a DVT on 6/21 07/21/15; the patient arrives with really no major change. Culture I did last time was negative. He has not had a follow-up MRI I ordered. The wounds are macerated covered with a thick gelatinous surface slough. There is necrotic subcutaneous tissue 08/04/15; the patient arrives with a considerable improvement in the majority of his wound area. The area on the left leg now has what looks to be a granulated base. Most of the wounds on the left leg required an aggressive surgical debridement to remove nonviable fibrinous eschar and subcutaneous tissue however after debridement most of this looks better. Although I had asked for repeat MRI of the left leg when he first came in here with grossly purulent material coming out of his wounds, it does not appear that this is been done and nor do I actually feel that strongly about it right now. He has severe surrounding venous insufficiency and inflammation. I don't believe he has significant PAD 08/25/15. In general there is still a considerable wound area here but with still extensive service adherent slough. The patient will not allow mechanical debridement due to pain. He did not tolerate Medihoney therefore we are left with Santyl for now. She would appear that he has severe surrounding venous stasis. There is no evidence of the infection that may have had something to do with the pathogenesis of these wounds 09/29/15; patient still has substantial wound area on the left leg with a cluster of several wounds on the lateral leg confluently on the left leg posteriorly and then a substantial wound on the medial leg. On the right leg  a substantial wound  medially and a small area on the right lateral foot. All of these underwent a substantial surgical debridement with curettes which she tolerated better than he has in the past. This started as a complex cellulitis in the face of chronic venous insufficiency and inflammation. 10/13/15; substantial cluster of wounds on the lateral aspect of his left leg confluently around to the other side. Extensive surgical debridement to remove for redness surface slough nonviable subcutaneous tissue. This is not an improvement. Also substantial wound on the medial right leg which is largely unchanged. The etiology of this was felt to be a complex cellulitis in the summer of 2016 in the face of chronic venous insufficiency and inflammation. The patient states that the wound on the right medial leg has been there for years off and on. 10/20/15; we are able to start [facility] to these extensive wound areas left greater than right on Tuesday after I spoke to the wound care nurse at the facility. He arrives here today for extensive surgical debridement for the wounds on the lateral aspect of his left leg posterior left leg, this is almost circumferential. He has a large wound on the medial aspect of his right leg although he finds this too painful for debridement 10/27/15; I continue to bring this patient back for frequent debridement/weekly debridement severe. We have been using Hydrofera Blue. Unfortunately this has not really had any improvement. The debridement surgery difficult and painful for the patient 11/23/15; this patient spent a complex hospitalization admitted with acute kidney injury, anemia, cellulitis of the lower extremity, he was felt to have sepsis pathophysiology although his blood cultures were negative. He had plain x-rays of both legs that were negative for osseous abnormalities. He was seen by infectious disease and placed on a workup for vasculitis that was negative including a biopsy 4 all were  consistent with stasis dermatitis. Vital broad- spectrum antibiotics he continued to spike fevers. Urine and chest x-ray were negative Dopplers on admission ruled out a DVT All antibiotics were stopped on . 2/17. Since his return to [facility] skilled nursing facility I believe they have been using Xeroform 12/07/15 the patient arrives today with the area on his right medial leg actually looking quite stable. No debridement. The rest of his extensive wounds on the left lateral left posterior extending into the left medial leg all required extensive debridement. I think this is probably going to need to and up in the hands of plastic surgery. We'll attempt to change him back to [facility]. Arrange consultation with plastic surgery at [facility] for this almost circumferential wound on the left side 12/21/15 right leg covered in surface slough. This was debridement. Left leg extensive wounds all carefully examined. This is almost circumferential on the left side especially on the posterior calf. No debridement is necessary. 01/04/16 the patient has been to [facility] plastic surgery. Unfortunately it doesn't look like they had any of the prior workup on this patient. They're in the middle of vascular workup. It sounds as though they're applying Hydrofera Blue at the nursing home. 01/22/16; the patient has been back to see [facility]. There apparently making plans to possibly do skin grafts over his large venous insufficiency ulcerations. The patient tells me that he goes to hematology in [facility] and variably uses the term platelets red cells and the description of his problem. 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 I do not see any  major difference in the wound on the right medial leg and circumferentially across the left medial to left lateral leg. Did not attempt to debride these today. Readmission: 05/05/18 on evaluation today patient presents for  reevaluation here in our clinic although I have previously seen him in the skilled nursing facility over the past year and a half when I was working in the skilled nursing facility realm instead of covering in the clinic. With that being said during that time we had initiated most recently dressings with Hydrofera Blue Dressing along with the [employer] which had done very well for him. Much better than the Kerlex Coban wraps. With that being said the patient had been doing fairly well and was making progress when I last saw him. Upon evaluation today it appears that the wounds are actually a little bit worse than when I last saw him although definitely not dramatically so. There does not appear to be any evidence of infection which is good news. With that being said he has been tolerating the wraps without complication his left hip still gives him a lot of trouble which is his main issue as far as movement is concerned. Unbeknownst to me he has not had venous studies that I can find. He previously told me he had there were arterial studies noted back from 04/27/15 which showed that he had try phasic blood flow throughout and again his test appear to be completely normal as performed by Dr. Creig Hines. With that being said I cannot find where he had venous studies. The patient still states he thinks he did these definitely had no venous intervention at this point. Upon evaluation today it does not appear to me that the patient has any evidence of cellulitis. He does have some chronic venous insufficiency which I think has led to stasis dermatitis but again this is not appearing to be infected at this point. No fevers, chills, nausea, or vomiting noted at this time. READMISSION 06/30/2018 This is a now 66 year old man we have had in this clinic at multiple times in the past. Most recently he came in August and was seen by [facility] stone. It is not clear why he did not come back we asked him really did not  get a straight answer. He is at [facility] skilled facility he is a man who has severe chronic venous insufficiency with lymphedema and has had severe wounds on his left greater than right leg. We had him here in 2016 and 17 ultimately referred him to [facility]. He had arterial studies and venous studies done at [facility] although I am not able to access these records I see they are actually done. He also had multiple biopsies that were negative for malignancy showing changes compatible with chronic inflammation. As I understand things in [place] they are using Iodoflex and Unna boots. I am not really sure how they are getting enough Iodoflex for the large wound area especially on his left calf. Nevertheless the wounds look better than when I saw this in 2017. There were plans for him to have plastic surgery in 2018 and I think he was actually prepped for surgery however he was ultimately denied because the patient was an active smoker. The patient is not systemically unwell. The wounds are painful however given the size especially on the left this is not surprising. ABIs in this clinic were 0.78 on the left and 0.91 on the right 07/13/2018; this is a patient with bilateral  severe venous ulcers with secondary lymphedema. The wounds are on the right medial calf and a substantial part of the left posterior calf and some involvement medially and laterally on the left. We changed him to silver alginate under Unna boot's last time. The wounds actually look fairly satisfactory today. 08/14/2018; this is a patient with severe bilateral venous stasis ulcers with secondary lymphedema left greater than right he is at [facility] using silver alginate under Unna boot's I do not think there is much change on this visit versus last time I saw him a month ago Readmission: 11/04/18 patient presents today for follow-up concerning his bilateral lower extremity ulcers. His a right medial ankle ulcer and a left  leg that has ulcers over a large proportion of the surface area between the ankle and knee. Unfortunately this does cause some discomfort for the patient although it doesn't seem to be as uncomfortable as it has been in the past. He is seen today for evaluation after a referral by [place] back to the clinic. Unfortunately has a lot of Slough noted on the surface of his wounds. He's been treated currently it sounds like with Dakin's soaked gauze and they have not been performing the Unna Boot wrap that was previously recommended. I'm not exactly sure what the reason for the change was. He does have less slough on the surface of the wound but again this is something that is often a constant issue for him. Again he's had these wounds for many years. I've known him for two years at least during that time when I was taking care of them in the facility he is Artie had the wounds for several years prior. READMISSION 02/25/2020 Josepha Pigg is a now 66 year old man. He has been in our clinic several times before most extensively in October 2016 through May 2017. At that time he had absolutely substantial bilateral lower extremity wounds secondary to chronic venous insufficiency and lymphedema. We ultimately referred him to [facility] he had a series of biopsies only showed suggestion of wound secondary to chronic venous insufficiency. He had a less extensive stay in our clinic in 2019 and then a single visit in February 2020. He is a resident at [facility] skilled facility. I think he has had intermittently had in facility wound care. He returns today. They are using silver alginate on the wounds although I am not sure what type of compression. Previously he has favored Unna boots. He is not felt to have an arterial issue. Past medical history; lymphedema and chronic venous insufficiency, diabetes mellitus, hypertension, congestive heart failure We did not do arterial studies today 04/27/2020 on evaluation patient  appears to be doing really about the same as when I have known him and seen him in the past. He does have wounds on the right and left legs at this point. Fortunately there is no signs of active infection at this time. 9/2; 1 month follow-up. This is a man with severe chronic venous insufficiency and lymphedema. When I first met him he had horrible circumferential bilateral lower extremity ulcers. We have been using silver alginate up until early last month when it was changed to [facility] and Unna boot's more or less says maintenance dressings. Since the last time he was here the facility where he resides [facility] called to report they could no longer do Unna boot so he is apparently only been receiving kerlix Coban the left leg is certainly a lot worse with almost circumferential large  wounds especially lateral but now medial and posterior as well. He has a standard same looking wound on the right medial lower leg. 10/1; absolutely no improvement here. In fact I do not think anything much is changed. The substantial area on his left lateral and left posterior calf is still open may be slightly larger. He has a more modest wound on the right medial calf. None of these look any different. He comes in with a kerlix Coban wrap this will simply not be adequate. He has too much edema in this leg He also is complaining a lot of pain in his left heel area. 11/8; potential wounds on the left lateral and posterior calf and a smaller oval wound in the right medial. We have been using Hydrofera Blue under 4-layer compression. He was using Unna boots still 2 months ago the facility called to say they can no longer place these so we have been using 4-layer compression. The surface area of the area on the left is so large there is very few alternatives to what we can use on this either silver alginate or Hydrofera Blue. He was complaining of pain on the left heel last time I asked for an x-ray this was  apparently done although we do not have the result. He is not complaining of pain today. 08/16/2020 on evaluation today patient is is seen for a early visit due to the fact that he apparently is having issues I was told when they called on Monday with a MRSA infection that they felt like we needed to see him for because his legs "looked horrible". With that being said based on what I see at this point it does not appear that the patient is actually having a terrible infection in fact compared to last time I saw him his legs do not appear to be doing too poorly at all at this time. Nonetheless he has been on doxycycline that is not going to be a good option for him based on the culture report that I have for review today which graded as a partial report with still several organisms pending as far as identification is concerned we did contact them but again they do not have the final report yet. Either way based on what we see it appears that doxycycline is one of the few medications that will not work for the Citrobacter. Obviously I think that a different medication would be a good option for him since there is a gram-negative organism pending I am likely can I suggest Cipro as the best of backup antibiotic to switch him to at this point. All this was discussed with the patient today. 12/20; not much change in the substantial wound on the left posterior calf and the small area on the right medial. He has a new area on the left anterior which looks like a wrap injury. Had some thoughts about doing a deep tissue culture here although we will put that off for next time. Using silver alginate as a primary dressing 2/10; substantial area on the left posterior calf. This measures smaller but still is a very large area. He has the area on the right anterior medial which also is a fairly large wound but much smaller than not on the left. His edema control is quite good. We have been using silver alginate  because of the size of the wounds. 4/7; substantial wound on the left posterior calf and a smaller area on the right medial lower leg.  These are very chronic wounds which we have classified is venous. Previously worked up at [facility] for 5 years ago at which time I had sent and will send him over for consideration of extensive skin graft I know they biopsied this many times but he never had plastic surgery. The wound area is much too large for standard size dressings we have been using silver alginate but we did give him a good trial last fall of Hydrofera Blue that did not make any difference either. If anything the area on the right medial lower leg over time has gotten a lot smaller as has the area on the left although it still quite substantial now. Readmission 10/08/2021 Mr. Damare Serano is a 66 year old male with a past medical history of insulin-dependent type 2 diabetes with last hemoglobin A1c of 10.2, chronic venous insufficiency, lymphedema and chronic diastolic heart failure that presents to the clinic for bilateral lower extremity wounds. He has been followed in our clinic previously for the past 6 years for these issues. He was last seen in April 2022. He is inconsistent with his appointments in our clinic. These wounds have not healed over the past few years. He is currently using silver alginate to the wound beds and keeping these covered with Kerlix. He currently denies systemic signs of infection. He was recently hospitalized for septic shock secondary to bacteremia from likely chronic leg wounds And is currently on oral antibiotics to be completed this week. 1/30; patient presents for follow-up. He has no issues or complaints today. Electronic Signature(s) Signed: 10/22/2021 12:39:32 PM By: [name] DO Entered By: [name] on 10/22/2021 12:32:58 -------------------------------------------------------------------------------- Physical Exam Details Patient Name: Date  of Service: [name], [name] 10/22/2021 9:45 A M Medical Record Number: [id number] Patient Account Number: [id number] Date of Birth/Sex: Treating RN: 09-May-1956 (66 y.o. [name] Primary Care Provider: [name] Other Clinician: Referring Provider: Treating Provider/Extender: [name] in Treatment: 2 Constitutional respirations regular, non-labored and within target range for patient.[facility] pleasant and cooperative. Notes Extensive bilateral lower extremity wounds. Granulation tissue nonviable tissue throughout the wound beds. Lymphedema skin changes bilaterally. Faint pedal pulses. Electronic Signature(s) Signed: 10/22/2021 12:39:32 PM By: [name] DO Entered By: [name] on 10/22/2021 12:35:17 -------------------------------------------------------------------------------- Physician Orders Details [name], [name] 10/22/2021 9:45 A M Medical Record Number: [id number] Patient Account Number: [id number] Date of Birth/Sex: Treating RN: 02-Jul-1956 (66 y.o. [name] Primary Care Provider: [name] Other Clinician: Referring Provider: Treating Provider/Extender: [name] in Treatment: 2 Verbal / Phone Orders: No Diagnosis Coding Follow-up Appointments ppointment in 1 week. - *****Extra Time**** 75 minutes Return A Dr Heber [place] Bathing/ Shower/ Hygiene May shower and wash wound with soap and water. - when changing dressing Edema Control - Lymphedema / SCD / Other Elevate legs to the level of the heart or above for 30 minutes daily and/or when sitting, a frequency of: Moisturize legs daily. Additional Orders / Instructions Follow Nutritious Diet - Monitor/Control Blood Sugars-Continue using ProStat Other: - We have sent referral to [facility]ilaist to obtain formal ABI/TBI before using any compression. Wound Treatment Wound #24 -  Lower Leg Wound Laterality: Right, Anterior Cleanser: Anasept Antimicrobial Skin and Wound Cleanser, 8 (oz) 1 x Per Day/30 Days Discharge Instructions: Cleanse the wound with Anasept cleanser prior to applying a clean dressing using gauze sponges, not tissue or cotton balls. Peri-Wound Care: Zinc Oxide Ointment 30g tube  1 x Per Day/30 Days Discharge Instructions: Apply Zinc Oxide to periwound with each dressing change Prim Dressing: KerraCel Ag Gelling Fiber Dressing, 4x5 in (silver alginate) 1 x Per Day/30 Days ary Discharge Instructions: Apply silver alginate to wound bed as instructed Secondary Dressing: ABD Pad, 8x10 1 x Per Day/30 Days Discharge Instructions: Apply over primary dressing as directed. Secured With: Transpore Surgical Tape, 2x10 (in/yd) 1 x Per Day/30 Days Discharge Instructions: Secure dressing with tape as directed. Compression Wrap: ThreePress (3 layer compression wrap) 1 x Per Day/30 Days Discharge Instructions: Apply three layer compression as directed. Wound #25 - Lower Leg Wound Laterality: Right, Medial Cleanser: Anasept Antimicrobial Skin and Wound Cleanser, 8 (oz) 1 x Per Day/30 Days Discharge Instructions: Cleanse the wound with Anasept cleanser prior to applying a clean dressing using gauze sponges, not tissue or cotton balls. Peri-Wound Care: Zinc Oxide Ointment 30g tube 1 x Per Day/30 Days Discharge Instructions: Apply Zinc Oxide to periwound with each dressing change Prim Dressing: KerraCel Ag Gelling Fiber Dressing, 4x5 in (silver alginate) 1 x Per Day/30 Days ary Discharge Instructions: Apply silver alginate to wound bed as instructed Secondary Dressing: ABD Pad, 8x10 1 x Per Day/30 Days Discharge Instructions: Apply over primary dressing as directed. Secured With: Transpore Surgical Tape, 2x10 (in/yd) 1 x Per Day/30 Days Discharge Instructions: Secure dressing with tape as directed. Compression Wrap: ThreePress (3 layer compression wrap) 1 x Per Day/30  Days Discharge Instructions: Apply three layer compression as directed. Wound #26 - Lower Leg Wound Laterality: Left, Posterior, Proximal Cleanser: Anasept Antimicrobial Skin and Wound Cleanser, 8 (oz) 1 x Per Day/30 Days Discharge Instructions: Cleanse the wound with Anasept cleanser prior to applying a clean dressing using gauze sponges, not tissue or cotton balls. Peri-Wound Care: Zinc Oxide Ointment 30g tube 1 x Per Day/30 Days Discharge Instructions: Apply Zinc Oxide to periwound with each dressing change Prim Dressing: KerraCel Ag Gelling Fiber Dressing, 4x5 in (silver alginate) 1 x Per Day/30 Days ary Discharge Instructions: Apply silver alginate to wound bed as instructed Secondary Dressing: ABD Pad, 8x10 1 x Per Day/30 Days Discharge Instructions: Apply over primary dressing as directed. Secured With: Transpore Surgical Tape, 2x10 (in/yd) 1 x Per Day/30 Days Discharge Instructions: Secure dressing with tape as directed. Compression Wrap: ThreePress (3 layer compression wrap) 1 x Per Day/30 Days Discharge Instructions: Apply three layer compression as directed. Wound #27 - Lower Leg Wound Laterality: Left, Posterior Cleanser: Anasept Antimicrobial Skin and Wound Cleanser, 8 (oz) 1 x Per Day/30 Days Discharge Instructions: Cleanse the wound with Anasept cleanser prior to applying a clean dressing using gauze sponges, not tissue or cotton balls. Peri-Wound Care: Zinc Oxide Ointment 30g tube 1 x Per Day/30 Days Discharge Instructions: Apply Zinc Oxide to periwound with each dressing change Prim Dressing: KerraCel Ag Gelling Fiber Dressing, 4x5 in (silver alginate) 1 x Per Day/30 Days ary Discharge Instructions: Apply silver alginate to wound bed as instructed Secondary Dressing: ABD Pad, 8x10 1 x Per Day/30 Days Discharge Instructions: Apply over primary dressing as directed. Secured With: Transpore Surgical Tape, 2x10 (in/yd) 1 x Per Day/30 Days Discharge Instructions: Secure  dressing with tape as directed. Compression Wrap: ThreePress (3 layer compression wrap) 1 x Per Day/30 Days Discharge Instructions: Apply three layer compression as directed. Electronic Signature(s) Signed: 10/22/2021 12:39:32 PM By: [name] DO Entered By: [name] on 10/22/2021 12:35:35 -------------------------------------------------------------------------------- Problem List Details [name], [name] 10/22/2021 9:45 A M Medical Record Number:  [id number] Patient Account Number: [id number] Date of Birth/Sex: Treating RN: 1956-09-16 (66 y.o. [name] Primary Care Provider: [name] Other Clinician: Referring Provider: Treating Provider/Extender: [name] in Treatment: 2 Active Problems ICD-10 Encounter Code Description Active Date MDM Diagnosis I87.333 Chronic venous hypertension (idiopathic) with ulcer and inflammation of 10/08/2021 No Yes bilateral lower extremity 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ed1/16/2023 No Yes L97.912 Non-pressure chronic ulcer of unspecified part of right lower leg with fat layer 10/08/2021 No Yes exposed I89.0 Lymphedema, not elsewhere classified 10/08/2021 No Yes Inactive Problems Resolved Problems Electronic Signature(s) Signed: 10/22/2021 12:39:32 PM By: [name] DO Entered By: [name] on 10/22/2021 12:32:01 -------------------------------------------------------------------------------- Progress Note Details [name], [name] 10/22/2021 9:45 A M Medical Record Number: [id number] Patient Account Number: [id number] Date of Birth/Sex: Treating RN: [birth year] (66 y.o. [name] Primary Care Provider: [name] Other Clinician: Referring Provider: Treating Provider/Extender: [name] in Treatment: 2 Subjective Chief  Complaint Information obtained from Patient 10/08/2021; bilateral lower extremity wounds History of Present Illness (HPI) 07/07/15; this is a patient who is in hospital on 8/2 through 8/4. He had cellulitis and abscess of predominantly I think the left leg. He received IV antibiotics. Plain x-ray showed no osteomyelitis. An MRI of the left leg did not show osteomyelitis. Cultures showed no predominant organism. His hemoglobin A1c was 9.8. He has a history of venous stasis also peripheral vascular disease. He was discharged to [facility]. He really has extensive ulcerations on the left lateral leg including a major wound that communicates both posteriorly and superiorly w. When drainage coming out of 2 smaller areas. He has a smaller wound on the posterior medial left leg. He has more predominantly venous insufficiency wounds on predominantly the right medial leg above the ankle the right foot into sections. He has recently been put on Augmentin at the nursing home. Previous ABIs/arterial evaluation showed triphasic waves diffusely he does not have a major ischemic issue a left lower extremity venous duplex also exam showed no evidence of a DVT on 6/21 07/21/15; the patient arrives with really no major change. Culture I did last time was negative. He has not had a follow-up MRI I ordered. The wounds are macerated covered with a thick gelatinous surface slough. There is necrotic subcutaneous tissue 08/04/15; the patient arrives with a considerable improvement in the majority of his wound area. The area on the left leg now has what looks to be a granulated base. Most of the wounds on the left leg required an aggressive surgical debridement to remove nonviable fibrinous eschar and subcutaneous tissue however after debridement most of this looks better. Although I had asked for repeat MRI of the left leg when he first came in here with grossly purulent material coming out of his wounds, it  does not appear that this is been done and nor do I actually feel that strongly about it right now. He has severe surrounding venous insufficiency and inflammation. I don't believe he has significant PAD 08/25/15. In general there is still a considerable wound area here but with still extensive service adherent slough. The patient will not allow mechanical debridement due to pain. He did not tolerate Medihoney therefore we are left with Santyl for now. She would appear that he has severe surrounding venous stasis. There is no evidence of the infection that may have had something  to do with the pathogenesis of these wounds 09/29/15; patient still has substantial wound area on the left leg with a cluster of several wounds on the lateral leg confluently on the left leg posteriorly and then a substantial wound on the medial leg. On the right leg a substantial wound medially and a small area on the right lateral foot. All of these underwent a substantial surgical debridement with curettes which she tolerated better than he has in the past. This started as a complex cellulitis in the face of chronic venous insufficiency and inflammation. 10/13/15; substantial cluster of wounds on the lateral aspect of his left leg confluently around to the other side. Extensive surgical debridement to remove for redness surface slough nonviable subcutaneous tissue. This is not an improvement. Also substantial wound on the medial right leg which is largely unchanged. The etiology of this was felt to be a complex cellulitis in the summer of 2016 in the face of chronic venous insufficiency and inflammation. The patient states that the wound on the right medial leg has been there for years off and on. 10/20/15; we are able to start [facility] to these extensive wound areas left greater than right on Tuesday after I spoke to the wound care nurse at the facility. He arrives here today for extensive surgical debridement for the  wounds on the lateral aspect of his left leg posterior left leg, this is almost circumferential. He has a large wound on the medial aspect of his right leg although he finds this too painful for debridement 10/27/15; I continue to bring this patient back for frequent debridement/weekly debridement severe. We have been using Hydrofera Blue. Unfortunately this has not really had any improvement. The debridement surgery difficult and painful for the patient 11/23/15; this patient spent a complex hospitalization admitted with acute kidney injury, anemia, cellulitis of the lower extremity, he was felt to have sepsis pathophysiology although his blood cultures were negative. He had plain x-rays of both legs that were negative for osseous abnormalities. He was seen by infectious disease and placed on a workup for vasculitis that was negative including a biopsy o4 all were consistent with stasis dermatitis. Vital broad-spectrum antibiotics he continued to spike fevers. Urine and chest x-ray were negative Dopplers on admission ruled out a DVT All antibiotics were stopped on 2/17. . Since his return to [facility] skilled nursing facility I believe they have been using Xeroform 12/07/15 the patient arrives today with the area on his right medial leg actually looking quite stable. No debridement. The rest of his extensive wounds on the left lateral left posterior extending into the left medial leg all required extensive debridement. I think this is probably going to need to and up in the hands of plastic surgery. We'll attempt to change him back to [facility]. Arrange consultation with plastic surgery at [facility] for this almost circumferential wound on the left side 12/21/15 right leg covered in surface slough. This was debridement. Left leg extensive wounds all carefully examined. This is almost circumferential on the left side especially on the posterior calf. No debridement is necessary. 01/04/16 the patient  has been to [facility] plastic surgery. Unfortunately it doesn't look like they had any of the prior workup on this patient. They're in the middle of vascular workup. It sounds as though they're applying Hydrofera Blue at the nursing home. 01/22/16; the patient has been back to see [facility]. There apparently making plans to possibly do skin grafts over his large venous  insufficiency ulcerations. The patient tells me that he goes to hematology in [facility] and variably uses the term platelets red cells and the description of his problem. 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 I do not see any major difference in the wound on the right medial leg and circumferentially across the left medial to left lateral leg. Did not attempt to debride these today. Readmission: 05/05/18 on evaluation today patient presents for reevaluation here in our clinic although I have previously seen him in the skilled nursing facility over the past year and a half when I was working in the skilled nursing facility realm instead of covering in the clinic. With that being said during that time we had initiated most recently dressings with Hydrofera Blue Dressing along with the [employer] which had done very well for him. Much better than the Kerlex Coban wraps. With that being said the patient had been doing fairly well and was making progress when I last saw him. Upon evaluation today it appears that the wounds are actually a little bit worse than when I last saw him although definitely not dramatically so. There does not appear to be any evidence of infection which is good news. With that being said he has been tolerating the wraps without complication his left hip still gives him a lot of trouble which is his main issue as far as movement is concerned. Unbeknownst to me he has not had venous studies that I can find. He previously told me he had there were arterial studies noted  back from 04/27/15 which showed that he had try phasic blood flow throughout and again his test appear to be completely normal as performed by Dr. Creig Hines. With that being said I cannot find where he had venous studies. The patient still states he thinks he did these definitely had no venous intervention at this point. Upon evaluation today it does not appear to me that the patient has any evidence of cellulitis. He does have some chronic venous insufficiency which I think has led to stasis dermatitis but again this is not appearing to be infected at this point. No fevers, chills, nausea, or vomiting noted at this time. READMISSION 06/30/2018 This is a now 66 year old man we have had in this clinic at multiple times in the past. Most recently he came in August and was seen by [facility] stone. It is not clear why he did not come back we asked him really did not get a straight answer. He is at [facility] skilled facility he is a man who has severe chronic venous insufficiency with lymphedema and has had severe wounds on his left greater than right leg. We had him here in 2016 and 17 ultimately referred him to [facility]. He had arterial studies and venous studies done at [facility] although I am not able to access these records I see they are actually done. He also had multiple biopsies that were negative for malignancy showing changes compatible with chronic inflammation. As I understand things in [place] they are using Iodoflex and Unna boots. I am not really sure how they are getting enough Iodoflex for the large wound area especially on his left calf. Nevertheless the wounds look better than when I saw this in 2017. There were plans for him to have plastic surgery in 2018 and I think he was actually prepped for surgery however he was ultimately denied because the patient was  an active smoker. The patient is not systemically unwell. The wounds are painful however given the size especially on the  left this is not surprising. ABIs in this clinic were 0.78 on the left and 0.91 on the right 07/13/2018; this is a patient with bilateral severe venous ulcers with secondary lymphedema. The wounds are on the right medial calf and a substantial part of the left posterior calf and some involvement medially and laterally on the left. We changed him to silver alginate under Unna boot's last time. The wounds actually look fairly satisfactory today. 08/14/2018; this is a patient with severe bilateral venous stasis ulcers with secondary lymphedema left greater than right he is at [facility] using silver alginate under Unna boot's I do not think there is much change on this visit versus last time I saw him a month ago Readmission: 11/04/18 patient presents today for follow-up concerning his bilateral lower extremity ulcers. His a right medial ankle ulcer and a left leg that has ulcers over a large proportion of the surface area between the ankle and knee. Unfortunately this does cause some discomfort for the patient although it doesn't seem to be as uncomfortable as it has been in the past. He is seen today for evaluation after a referral by [place] back to the clinic. Unfortunately has a lot of Slough noted on the surface of his wounds. He's been treated currently it sounds like with Dakin's soaked gauze and they have not been performing the Unna Boot wrap that was previously recommended. I'm not exactly sure what the reason for the change was. He does have less slough on the surface of the wound but again this is something that is often a constant issue for him. Again he's had these wounds for many years. I've known him for two years at least during that time when I was taking care of them in the facility he is Artie had the wounds for several years prior. READMISSION 02/25/2020 Josepha Pigg is a now 66 year old man. He has been in our clinic several times before most extensively in October 2016 through May  2017. At that time he had absolutely substantial bilateral lower extremity wounds secondary to chronic venous insufficiency and lymphedema. We ultimately referred him to [facility] he had a series of biopsies only showed suggestion of wound secondary to chronic venous insufficiency. He had a less extensive stay in our clinic in 2019 and then a single visit in February 2020. He is a resident at [facility] skilled facility. I think he has had intermittently had in facility wound care. He returns today. They are using silver alginate on the wounds although I am not sure what type of compression. Previously he has favored Unna boots. He is not felt to have an arterial issue. Past medical history; lymphedema and chronic venous insufficiency, diabetes mellitus, hypertension, congestive heart failure We did not do arterial studies today 04/27/2020 on evaluation patient appears to be doing really about the same as when I have known him and seen him in the past. He does have wounds on the right and left legs at this point. Fortunately there is no signs of active infection at this time. 9/2; 1 month follow-up. This is a man with severe chronic venous insufficiency and lymphedema. When I first met him he had horrible circumferential bilateral lower extremity ulcers. We have been using silver alginate up until early last month when it was changed to [facility] and Unna boot's more or less says maintenance  dressings. Since the last time he was here the facility where he resides [facility] called to report they could no longer do Unna boot so he is apparently only been receiving kerlix Coban the left leg is certainly a lot worse with almost circumferential large wounds especially lateral but now medial and posterior as well. He has a standard same looking wound on the right medial lower leg. 10/1; absolutely no improvement here. In fact I do not think anything much is changed. The substantial area on his left  lateral and left posterior calf is still open may be slightly larger. He has a more modest wound on the right medial calf. None of these look any different. He comes in with a kerlix Coban wrap this will simply not be adequate. He has too much edema in this leg He also is complaining a lot of pain in his left heel area. 11/8; potential wounds on the left lateral and posterior calf and a smaller oval wound in the right medial. We have been using Hydrofera Blue under 4-layer compression. He was using Unna boots still 2 months ago the facility called to say they can no longer place these so we have been using 4-layer compression. The surface area of the area on the left is so large there is very few alternatives to what we can use on this either silver alginate or Hydrofera Blue. He was complaining of pain on the left heel last time I asked for an x-ray this was apparently done although we do not have the result. He is not complaining of pain today. 08/16/2020 on evaluation today patient is is seen for a early visit due to the fact that he apparently is having issues I was told when they called on Monday with a MRSA infection that they felt like we needed to see him for because his legs "looked horrible". With that being said based on what I see at this point it does not appear that the patient is actually having a terrible infection in fact compared to last time I saw him his legs do not appear to be doing too poorly at all at this time. Nonetheless he has been on doxycycline that is not going to be a good option for him based on the culture report that I have for review today which graded as a partial report with still several organisms pending as far as identification is concerned we did contact them but again they do not have the final report yet. Either way based on what we see it appears that doxycycline is one of the few medications that will not work for the Citrobacter. Obviously I think that a  different medication would be a good option for him since there is a gram-negative organism pending I am likely can I suggest Cipro as the best of backup antibiotic to switch him to at this point. All this was discussed with the patient today. 12/20; not much change in the substantial wound on the left posterior calf and the small area on the right medial. He has a new area on the left anterior which looks like a wrap injury. Had some thoughts about doing a deep tissue culture here although we will put that off for next time. Using silver alginate as a primary dressing 2/10; substantial area on the left posterior calf. This measures smaller but still is a very large area. He has the area on the right anterior medial which also is a fairly large  wound but much smaller than not on the left. His edema control is quite good. We have been using silver alginate because of the size of the wounds. 4/7; substantial wound on the left posterior calf and a smaller area on the right medial lower leg. These are very chronic wounds which we have classified is venous. Previously worked up at [facility] for 5 years ago at which time I had sent and will send him over for consideration of extensive skin graft I know they biopsied this many times but he never had plastic surgery. The wound area is much too large for standard size dressings we have been using silver alginate but we did give him a good trial last fall of Hydrofera Blue that did not make any difference either. If anything the area on the right medial lower leg over time has gotten a lot smaller as has the area on the left although it still quite substantial now. Readmission 10/08/2021 Mr. Dinesh Ulysse is a 66 year old male with a past medical history of insulin-dependent type 2 diabetes with last hemoglobin A1c of 10.2, chronic venous insufficiency, lymphedema and chronic diastolic heart failure that presents to the clinic for bilateral lower extremity wounds.  He has been followed in our clinic previously for the past 6 years for these issues. He was last seen in April 2022. He is inconsistent with his appointments in our clinic. These wounds have not healed over the past few years. He is currently using silver alginate to the wound beds and keeping these covered with Kerlix. He currently denies systemic signs of infection. He was recently hospitalized for septic shock secondary to bacteremia from likely chronic leg wounds And is currently on oral antibiotics to be completed this week. 1/30; patient presents for follow-up. He has no issues or complaints today. Patient History Information obtained from Patient. Family History Diabetes - Mother,Father,Siblings, Hypertension - Mother,Father, No family history of Cancer, Heart Disease, Hereditary Spherocytosis, Kidney Disease, Lung Disease, Seizures, Stroke, Thyroid Problems, Tuberculosis. Social History Current every day smoker - 4 cig per day, Marital Status - Separated, Alcohol Use - Never, Drug Use - Prior History - cocaine, [place] quit 2005, Caffeine Use - Moderate. Medical History Eyes Denies history of Cataracts, Glaucoma, Optic Neuritis Ear/Nose/Mouth/Throat Denies history of Chronic sinus problems/congestion, Middle ear problems Hematologic/Lymphatic Patient has history of Anemia - iron deficiency Denies history of Hemophilia, Human Immunodeficiency Virus, Lymphedema, Sickle Cell Disease Respiratory Patient has history of Sleep Apnea Denies history of Aspiration, Asthma, Chronic Obstructive Pulmonary Disease (COPD), Pneumothorax, Tuberculosis Cardiovascular Patient has history of Congestive Heart Failure, Coronary Artery Disease, Hypertension, Peripheral Venous Disease Denies history of Angina, Arrhythmia, Deep Vein Thrombosis, Hypotension, Myocardial Infarction, Peripheral Arterial Disease, Phlebitis, Vasculitis Gastrointestinal Denies history of Cirrhosis , Colitis, Crohnoos, Hepatitis A,  Hepatitis B, Hepatitis C Endocrine Patient has history of Type II Diabetes - uncontrolled Denies history of Type I Diabetes Genitourinary Denies history of End Stage Renal Disease Immunological Denies history of Lupus Erythematosus, Raynaudoos, Scleroderma Integumentary (Skin) Denies history of History of Burn Musculoskeletal Patient has history of Osteoarthritis Denies history of Gout, Rheumatoid Arthritis, Osteomyelitis Neurologic Patient has history of Neuropathy Denies history of Dementia, Quadriplegia, Paraplegia, Seizure Disorder Oncologic Denies history of Received Chemotherapy, Received Radiation Psychiatric Denies history of Anorexia/bulimia, Confinement Anxiety Hospitalization/Surgery History - inguinal hernia repair with mesh. - right shoulder rotator cuff repair. - toe nail excision. - Sepsis 09/03/21-09/10/22. Medical A Surgical History Notes nd Gastrointestinal GERD Musculoskeletal degenerative righ thip Psychiatric depression Objective Constitutional  respirations regular, non-labored and within target range for patient.. Vitals Time Taken: 10:00 AM, Height: 77 in, Weight: 258 lbs, BMI: 30.6, Temperature: 98.3 F, Pulse: 77 bpm, Respiratory Rate: 16 breaths/min, Blood Pressure: 97/64 mmHg. General Notes: Patient seems lethargic at times. Patient will arousal when spoken to and when painful stimuli when picking up or moving the left leg. Psychiatric pleasant and cooperative. General Notes: Extensive bilateral lower extremity wounds. Granulation tissue nonviable tissue throughout the wound beds. Lymphedema skin changes bilaterally. Faint pedal pulses. Integumentary (Hair, Skin) Wound #24 status is Open. Original cause of wound was Gradually Appeared. The date acquired was: 09/07/2021. The wound has been in treatment 2 weeks. The wound is located on the Right,Anterior Lower Leg. The wound measures 15cm length x 15cm width x 0.1cm depth; 176.715cm^2 area and  17.671cm^3 volume. There is Fat Layer (Subcutaneous Tissue) exposed. There is no tunneling or undermining noted. There is a large amount of serous drainage noted. The wound margin is distinct with the outline attached to the wound base. There is large (67-100%) pink granulation within the wound bed. There is no necrotic tissue within the wound bed. Wound #25 status is Open. Original cause of wound was Gradually Appeared. The date acquired was: 09/07/2021. The wound has been in treatment 2 weeks. The wound is located on the Right,Medial Lower Leg. The wound measures 5.5cm length x 3.8cm width x 0.3cm depth; 16.415cm^2 area and 4.924cm^3 volume. There is Fat Layer (Subcutaneous Tissue) exposed. There is no tunneling noted. There is a medium amount of serosanguineous drainage noted. The wound margin is thickened. There is medium (34-66%) red granulation within the wound bed. There is a medium (34-66%) amount of necrotic tissue within the wound bed including Adherent Slough. Wound #26 status is Open. Original cause of wound was Gradually Appeared. The date acquired was: 09/07/2021. The wound has been in treatment 2 weeks. The wound is located on the Left,Proximal,Posterior Lower Leg. The wound measures 7cm length x 8cm width x 0.1cm depth; 43.982cm^2 area and 4.398cm^3 volume. There is Fat Layer (Subcutaneous Tissue) exposed. There is no undermining noted. There is a medium amount of serosanguineous drainage noted. The wound margin is distinct with the outline attached to the wound base. There is large (67-100%) red granulation within the wound bed. There is a small (1-33%) amount of necrotic tissue within the wound bed including Adherent Slough. Wound #27 status is Open. Original cause of wound was Gradually Appeared. The date acquired was: 09/07/2021. The wound has been in treatment 2 weeks. The wound is located on the Left,Posterior Lower Leg. The wound measures 12.8cm length x 18cm width x 0.6cm  depth; 180.956cm^2 area and 108.573cm^3 volume. There is Fat Layer (Subcutaneous Tissue) exposed. There is no tunneling or undermining noted. There is a large amount of serosanguineous drainage noted. The wound margin is distinct with the outline attached to the wound base. There is medium (34-66%) red granulation within the wound bed. There is a medium (34-66%) amount of necrotic tissue within the wound bed including Adherent Slough. 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 At this time I recommended using a 3 layer compression along  with silver alginate. He would likely benefit from blast x w/collagen to address any bioburden. We will work with our wound rep to get this. For now he can do Anasept,silver alginate with 3 layer compression. These are fairly extensive wounds that have not healed over several years. He may benefit from OR debridement with skin substitutes. We will reevaluate this over the next few weeks. Procedures Wound #24 Pre-procedure diagnosis of Wound #24 is a Venous Leg Ulcer located on the Right,Anterior Lower Leg . There was a Three Layer Compression Therapy Procedure by Dellie Catholic, RN. Post procedure Diagnosis Wound #24: Same as Pre-Procedure Wound #25 Pre-procedure diagnosis of Wound #25 is a Venous Leg Ulcer located on the Right,Medial Lower Leg . There was a Three Layer Compression Therapy Procedure by Dellie Catholic, RN. Post procedure Diagnosis Wound #25: Same as Pre-Procedure Wound #26 Pre-procedure diagnosis of Wound #26 is a Venous Leg Ulcer located on  the Left,Proximal,Posterior Lower Leg . There was a Three Layer Compression Therapy Procedure by Dellie Catholic, RN. Post procedure Diagnosis Wound #26: Same as Pre-Procedure Wound #27 Pre-procedure diagnosis of Wound #27 is a Venous Leg Ulcer located on the Left,Posterior Lower Leg . There was a Three Layer Compression Therapy Procedure by Dellie Catholic, RN. Post procedure Diagnosis Wound #27: Same as Pre-Procedure Plan Follow-up Appointments: Return Appointment in 1 week. - *****Extra Time**** 75 minutes Dr Heber [place] Bathing/ Shower/ Hygiene: May shower and wash wound with soap and water. - when changing dressing Edema Control - Lymphedema / SCD / Other: Elevate legs to the level of the heart or above for 30 minutes daily and/or when sitting, a frequency of: Moisturize legs daily. Additional Orders / Instructions: Follow Nutritious Diet - Monitor/Control Blood Sugars-Continue using ProStat Other: - We have sent referral to [facility] to obtain formal ABI/TBI before using any compression. WOUND #24: - Lower Leg Wound Laterality: Right, Anterior Cleanser: Anasept Antimicrobial Skin and Wound Cleanser, 8 (oz) 1 x Per Day/30 Days Discharge Instructions: Cleanse the wound with Anasept cleanser prior to applying a clean dressing using gauze sponges, not tissue or cotton balls. Peri-Wound Care: Zinc Oxide Ointment 30g tube 1 x Per Day/30 Days Discharge Instructions: Apply Zinc Oxide to periwound with each dressing change Prim Dressing: KerraCel Ag Gelling Fiber Dressing, 4x5 in (silver alginate) 1 x Per Day/30 Days ary Discharge Instructions: Apply silver alginate to wound bed as instructed Secondary Dressing: ABD Pad, 8x10 1 x Per Day/30 Days Discharge Instructions: Apply over primary dressing as directed. Secured With: Transpore Surgical T ape, 2x10 (in/yd) 1 x Per Day/30 Days Discharge Instructions: Secure dressing with tape as directed. Com pression Wrap: ThreePress  (3 layer compression wrap) 1 x Per Day/30 Days Discharge Instructions: Apply three layer compression as directed. WOUND #25: - Lower Leg Wound Laterality: Right, Medial Cleanser: Anasept Antimicrobial Skin and Wound Cleanser, 8 (oz) 1 x Per Day/30 Days Discharge Instructions: Cleanse the wound with Anasept cleanser prior to applying a clean dressing using gauze sponges, not tissue or cotton balls. Peri-Wound Care: Zinc Oxide Ointment 30g tube 1 x Per Day/30 Days Discharge Instructions: Apply Zinc Oxide to periwound with each dressing change Prim Dressing: KerraCel Ag Gelling Fiber Dressing, 4x5 in (silver alginate) 1 x Per Day/30 Days ary Discharge Instructions: Apply silver alginate to wound bed as instructed Secondary Dressing: ABD Pad, 8x10 1 x Per Day/30 Days Discharge Instructions: Apply over primary dressing as directed. Secured With: Transpore Surgical T ape, 2x10 (in/yd) 1 x Per Day/30 Days  Discharge Instructions: Secure dressing with tape as directed. Com pression Wrap: ThreePress (3 layer compression wrap) 1 x Per Day/30 Days Discharge Instructions: Apply three layer compression as directed. WOUND #26: - Lower Leg Wound Laterality: Left, Posterior, Proximal Cleanser: Anasept Antimicrobial Skin and Wound Cleanser, 8 (oz) 1 x Per Day/30 Days Discharge Instructions: Cleanse the wound with Anasept cleanser prior to applying a clean dressing using gauze sponges, not tissue or cotton balls. Peri-Wound Care: Zinc Oxide Ointment 30g tube 1 x Per Day/30 Days Discharge Instructions: Apply Zinc Oxide to periwound with each dressing change Prim Dressing: KerraCel Ag Gelling Fiber Dressing, 4x5 in (silver alginate) 1 x Per Day/30 Days ary Discharge Instructions: Apply silver alginate to wound bed as instructed Secondary Dressing: ABD Pad, 8x10 1 x Per Day/30 Days Discharge Instructions: Apply over primary dressing as directed. Secured With: Transpore Surgical T ape, 2x10 (in/yd) 1 x Per  Day/30 Days Discharge Instructions: Secure dressing with tape as directed. Com pression Wrap: ThreePress (3 layer compression wrap) 1 x Per Day/30 Days Discharge Instructions: Apply three layer compression as directed. WOUND #27: - Lower Leg Wound Laterality: Left, Posterior Cleanser: Anasept Antimicrobial Skin and Wound Cleanser, 8 (oz) 1 x Per Day/30 Days Discharge Instructions: Cleanse the wound with Anasept cleanser prior to applying a clean dressing using gauze sponges, not tissue or cotton balls. Peri-Wound Care: Zinc Oxide Ointment 30g tube 1 x Per Day/30 Days Discharge Instructions: Apply Zinc Oxide to periwound with each dressing change Prim Dressing: KerraCel Ag Gelling Fiber Dressing, 4x5 in (silver alginate) 1 x Per Day/30 Days ary Discharge Instructions: Apply silver alginate to wound bed as instructed Secondary Dressing: ABD Pad, 8x10 1 x Per Day/30 Days Discharge Instructions: Apply over primary dressing as directed. Secured With: Transpore Surgical T ape, 2x10 (in/yd) 1 x Per Day/30 Days Discharge Instructions: Secure dressing with tape as directed. Com pression Wrap: ThreePress (3 layer compression wrap) 1 x Per Day/30 Days Discharge Instructions: Apply three layer compression as directed. 1. Silver alginate with 3 layer compression 2. Follow-up in 1 week Electronic Signature(s) Signed: 10/22/2021 12:39:32 PM By: [name] DO Entered By: [name] on 10/22/2021 12:38:20 -------------------------------------------------------------------------------- HxROS Details [name], [name] 10/22/2021 9:45 A M Medical Record Number: [id number] Patient Account Number: [id number] Date of Birth/Sex: Treating RN: Jun 07, 1956 (66 y.o. [name] Primary Care Provider: [name] Other Clinician: Referring Provider: Treating Provider/Extender: [name] in Treatment: 2 Information Obtained  From Patient Eyes Medical History: Negative for: Cataracts; Glaucoma; Optic Neuritis Ear/Nose/Mouth/Throat Medical History: Negative for: Chronic sinus problems/congestion; Middle ear problems Hematologic/Lymphatic Medical History: Positive for: Anemia - iron deficiency Negative for: Hemophilia; Human Immunodeficiency Virus; Lymphedema; Sickle Cell Disease Respiratory Medical History: Positive for: Sleep Apnea Negative for: Aspiration; Asthma; Chronic Obstructive Pulmonary Disease (COPD); Pneumothorax; Tuberculosis Cardiovascular Medical History: Positive for: Congestive Heart Failure; Coronary Artery Disease; Hypertension; Peripheral Venous Disease Negative for: Angina; Arrhythmia; Deep Vein Thrombosis; Hypotension; Myocardial Infarction; Peripheral Arterial Disease; Phlebitis; Vasculitis Gastrointestinal Medical History: Negative for: Cirrhosis ; Colitis; Crohns; Hepatitis A; Hepatitis B; Hepatitis C Past Medical History Notes: GERD Endocrine Medical History: Positive for: Type II Diabetes - uncontrolled Negative for: Type I Diabetes Time with diabetes: 1999 Treated with: Insulin Blood sugar tested every day: Yes T ested : 3x per day Blood sugar testing results: Breakfast: 81-200; Lunch: 180-210; Dinner: 200 Genitourinary Medical History: Negative for: End Stage Renal Disease Immunological Medical History: Negative for: Lupus Erythematosus; Raynauds; Scleroderma Integumentary (Skin)  Medical History: Negative for: History of Burn Musculoskeletal Medical History: Positive for: Osteoarthritis Negative for: Gout; Rheumatoid Arthritis; Osteomyelitis Past Medical History Notes: degenerative righ thip Neurologic Medical History: Positive for: Neuropathy Negative for: Dementia; Quadriplegia; Paraplegia; Seizure Disorder Oncologic Medical History: Negative for: Received Chemotherapy; Received Radiation Psychiatric Medical History: Negative for: Anorexia/bulimia;  Confinement Anxiety Past Medical History Notes: depression Immunizations Pneumococcal Vaccine: Received Pneumococcal Vaccination: Yes Received Pneumococcal Vaccination On or After 60th Birthday: No Implantable Devices None Hospitalization / Surgery History Type of Hospitalization/Surgery inguinal hernia repair with mesh right shoulder rotator cuff repair toe nail excision Sepsis 09/03/21-09/10/22 Family and Social History Cancer: No; Diabetes: Yes - Mother,Father,Siblings; Heart Disease: No; Hereditary Spherocytosis: No; Hypertension: Yes - Mother,Father; Kidney Disease: No; Lung Disease: No; Seizures: No; Stroke: No; Thyroid Problems: No; Tuberculosis: No; Current every day smoker - 4 cig per day; Marital Status - Separated; Alcohol Use: Never; Drug Use: Prior History - cocaine, [place] quit 2005; Caffeine Use: Moderate; Financial Concerns: No; Food, Clothing or Shelter Needs: No; Support System Lacking: No; Transportation Concerns: No Electronic Signature(s) Signed: 10/22/2021 12:39:32 PM By: [name] DO Signed: 10/22/2021 4:11:37 PM By: Lorrin Jackson Entered By: [name] on 10/22/2021 12:34:39 -------------------------------------------------------------------------------- SuperBill Details [name], [name] 10/22/2021 Medical Record Number: [id number] Patient Account Number: [id number] Date of Birth/Sex: Treating RN: November 08, 1955 (66 y.o. [name] Primary Care Provider: [name] Other Clinician: Referring Provider: Treating Provider/Extender: [name] in Treatment: 2 Diagnosis Coding ICD-10 Codes Code Description [phone] Chronic venous hypertension (idiopathic) with ulcer and inflammation of bilateral lower extremity I89.0 Lymphedema, not elsewhere classified L97.222 Non-pressure chronic ulcer of left calf with fat layer exposed L97.212 Non-pressure chronic ulcer of right calf with fat  layer exposed Facility Procedures CPT4: Code [id number] foot Description: 1 BILATERAL: Application of multi-layer venous compression system; leg (below knee), including ankle and . Modifier: Quantity: 1 Physician Procedures : CPT4 Code Description Modifier [id number] - WC PHYS LEVEL 3 - EST PT ICD-10 Diagnosis Description I87.333 Chronic venous hypertension (idiopathic) with ulcer and inflammation of bilateral lower extremity I89.0 Lymphedema, not elsewhere classified  L97.222 Non-pressure chronic ulcer of left calf with fat layer exposed L97.212 Non-pressure chronic ulcer of right calf with fat layer exposed Quantity: 1 Electronic Signature(s) Signed: 10/22/2021 12:39:32 PM By: [name] DO Entered By: [name] on 10/22/2021 12:39:10

## 2021-10-22 NOTE — Progress Notes (Signed)
[name] ([id number]) Visit Report for 10/22/2021 Arrival Information Details [name], [name] 10/22/2021 9:45 [name] M Medical Record Number: [id number] Patient Account Number: [id number] Date of Birth/Sex: Treating RN: 02-Apr-1956 (66 y.o. [name] Primary Care Jamel Holzmann: [name] Other Clinician: Referring Kaivon Livesey: Treating Orlanda Frankum/Extender: [name] in Treatment: 2 Visit Information History Since Last Visit Added or deleted any medications: No Patient Arrived: Wheel Chair Any new allergies or adverse reactions: No Arrival Time: 09:45 Had [name] fall or experienced change in No Accompanied By: self activities of daily living that may affect Transfer Assistance: Manual risk of falls: Patient Identification Verified: Yes Signs or symptoms of abuse/neglect since last visito No Secondary Verification Process Completed: Yes Hospitalized since last visit: No Patient Requires Transmission-Based No Implantable device outside of the clinic excluding No Precautions: cellular tissue based products placed in the center Patient Has Alerts: Yes since last visit: Patient Alerts: Patient on Blood Thinner Has Dressing in Place as Prescribed: Yes *NO BLOOD Pain Present Now: No PRODUCTS* Electronic Signature(s) Signed: 10/22/2021 5:33:54 PM By: [name] RN, BSN Entered By: [name] on 10/22/2021 10:01:00 -------------------------------------------------------------------------------- Compression Therapy Details [name], [name] 10/22/2021 9:45 [name] M Medical Record Number: [id number] Patient Account Number: [id number] Date of Birth/Sex: Treating RN: 1956-04-02 (66 y.o. [name] Primary Care [name]: [name] Other Clinician: Referring Nekayla Heider: Treating Elide Stalzer/Extender: [name] in Treatment: 2 Compression Therapy Performed for Wound  Assessment: Wound #25 Right,Medial Lower Leg Performed By: Clinician Dellie Catholic, RN Compression Type: Three Layer Post Procedure Diagnosis Same as Pre-procedure Electronic Signature(s) Signed: 10/22/2021 5:28:28 PM By: Dellie Catholic RN Entered By: Dellie Catholic on 10/22/2021 10:51:41 -------------------------------------------------------------------------------- Compression Therapy Details [name], [name] 10/22/2021 9:45 [name] M Medical Record Number: [id number] Patient Account Number: [id number] Date of Birth/Sex: Treating RN: 15-Dec-1955 (66 y.o. [name] Primary Care Amalie Koran: [name] Other Clinician: Referring [name]: Treating [name]/Extender: [name] in Treatment: 2 Compression Therapy Performed for Wound Assessment: Wound #24 Right,Anterior Lower Leg Performed By: Clinician Dellie Catholic, RN Compression Type: Three Layer Post Procedure Diagnosis Same as Pre-procedure Electronic Signature(s) Signed: 10/22/2021 5:28:28 PM By: Dellie Catholic RN Entered By: Dellie Catholic on 10/22/2021 10:51:41 -------------------------------------------------------------------------------- Compression Therapy Details [name], [name] 10/22/2021 9:45 [name] M Medical Record Number: [id number] Patient Account Number: [id number] Date of Birth/Sex: Treating RN: [birth year] (66 y.o. [name] Primary Care [name]: [name] Other Clinician: Referring [name]: Treating [name]/Extender: [name] in Treatment: 2 Compression Therapy Performed for Wound Assessment: Wound #26 Left,Proximal,Posterior Lower Leg Performed By: Clinician Dellie Catholic, RN Compression Type: Three Layer Post Procedure Diagnosis Same as Pre-procedure Electronic Signature(s) Signed: 10/22/2021 5:28:28 PM By: Dellie Catholic RN Entered By: Dellie Catholic on  10/22/2021 10:51:41 -------------------------------------------------------------------------------- Compression Therapy Details [name], [name] 10/22/2021 9:45 [name] M Medical Record Number: [id number] Patient Account Number: [id number] Date of Birth/Sex: Treating RN: 06-Feb-1956 (66 y.o. [name] Primary Care [name]: [name] Other Clinician: Referring [name]: Treating [name]/Extender: [name] in Treatment: 2 Compression Therapy Performed for Wound Assessment: Wound #27 [place] Lower Leg Performed By: Clinician Dellie Catholic, RN Compression Type: Three Layer Post Procedure Diagnosis Same as Pre-procedure Electronic Signature(s) Signed: 10/22/2021 5:28:28 PM By: Dellie Catholic RN Signed: 10/22/2021 5:28:28 PM By: Dellie Catholic RN Entered  By: Dellie Catholic on 10/22/2021 10:51:41 -------------------------------------------------------------------------------- Encounter Discharge Information Details [name], [name] 10/22/2021 9:45 [name] M Medical Record Number: [id number] Patient Account Number: [id number] Date of Birth/Sex: Treating RN: 02/04/1956 (66 y.o. [name] Primary Care [name]: [name] Other Clinician: Referring [name]: Treating [name]/Extender: [name] in Treatment: 2 Encounter Discharge Information Items Discharge Condition: Stable Ambulatory Status: Wheelchair Discharge Destination: [place] Telephoned: No Orders Sent: Yes Transportation: Other Accompanied By: self Schedule Follow-up Appointment: Yes Clinical Summary of Care: Patient Declined Notes Assistant was present to transport patient back to the facility Electronic Signature(s) Signed: 10/22/2021 5:28:28 PM By: Dellie Catholic RN Entered By: Dellie Catholic on 10/22/2021  12:13:27 -------------------------------------------------------------------------------- Lower Extremity Assessment Details [name], [name] 10/22/2021 9:45 [name] M Medical Record Number: [id number] Patient Account Number: [id number] Date of Birth/Sex: Treating RN: 09-Mar-1956 (66 y.o. [name] Primary Care [name]: [name] Other Clinician: Referring Jessalyn Hinojosa: Treating Archer Moist/Extender: [name] in Treatment: 2 Edema Assessment Assessed: Shirlyn Goltz: Yes] Patrice Paradise: Yes] Edema: [Left: Yes] [Right: Yes] Calf Left: Right: Point of Measurement: 37 cm From Medial Instep 41 cm 43 cm Ankle Left: Right: Point of Measurement: 12 cm From Medial Instep 30 cm 28 cm Electronic Signature(s) Signed: 10/22/2021 5:33:54 PM By: [name] RN, BSN Entered By: [name] on 10/22/2021 10:03:20 -------------------------------------------------------------------------------- Multi Wound Chart Details [name], [name] 10/22/2021 9:45 [name] M Medical Record Number: [id number] Patient Account Number: [id number] Date of Birth/Sex: Treating RN: 1956-05-03 (66 y.o. [name] Primary Care Hazely Sealey: [name] Other Clinician: Referring Caydyn Sprung: Treating Chantilly Linskey/Extender: [name] in Treatment: 2 Vital Signs Height(in): 61 Pulse(bpm): 60 Weight(lbs): 258 Blood Pressure(mmHg): 97/64 Body Mass Index(BMI): 30.6 Temperature(F): 98.3 Respiratory Rate(breaths/min): 16 Photos: Right, Anterior Lower Leg Right, Medial Lower Leg Left, Proximal, Posterior Lower Leg Wound Location: Gradually Appeared Gradually Appeared Gradually Appeared Wounding Event: Venous Leg Ulcer Venous Leg Ulcer Venous Leg Ulcer Primary Etiology: Anemia, Sleep Apnea, Congestive Anemia, Sleep Apnea, Congestive Anemia, Sleep Apnea, Congestive Comorbid History: Heart Failure, Coronary Artery Heart  Failure, Coronary Artery Heart Failure, Coronary Artery Disease, Hypertension, Peripheral Disease, Hypertension, Peripheral Disease, Hypertension, Peripheral Venous Disease, Type II Diabetes, Venous Disease, Type II Diabetes, Venous Disease, Type II Diabetes, Osteoarthritis, Neuropathy Osteoarthritis, Neuropathy Osteoarthritis, Neuropathy 09/07/2021 09/07/2021 09/07/2021 Date Acquired: 2 2 2  Weeks of Treatment: Open Open Open Wound Status: No No No Wound Recurrence: Yes No No Clustered Wound: 4 [name] [name] Clustered Quantity: 15x15x0.1 5.5x3.8x0.3 7x8x0.1 Measurements L x W x D (cm) 176.715 16.415 43.982 [name] (cm) : rea 17.671 4.924 4.398 Volume (cm) : 20.10% -36.60% 44.70% % Reduction in Area: 20.10% -36.60% 81.60% % Reduction in Volume: Full Thickness Without Exposed Full Thickness Without Exposed Full Thickness Without Exposed Classification: Support Structures Support Structures Support Structures Large Medium Medium Exudate Amount: Serous Serosanguineous Serosanguineous Exudate Type: amber red, brown red, brown Exudate Color: Distinct, outline attached Thickened Distinct, outline attached Wound Margin: Large (67-100%) Medium (34-66%) Large (67-100%) Granulation Amount: Pink Red Red Granulation Quality: None Present (0%) Medium (34-66%) Small (1-33%) Necrotic Amount: Fat Layer (Subcutaneous Tissue): Yes Fat Layer (Subcutaneous Tissue): Yes Fat Layer (Subcutaneous Tissue): Yes Exposed Structures: Fascia: No Fascia: No Fascia: No Tendon: No Tendon: No Tendon: No Muscle: No Muscle: No Muscle: No Joint: No Joint: No Joint: No Bone: No Bone: No Bone: No Medium (34-66%) None Medium (34-66%) Epithelialization: Compression Therapy  Compression Therapy Compression Therapy Procedures Performed: Wound Number: 27 [name] [name] Photos: [name] [name] Left, Posterior Lower Leg [name] [name] Wound Location: Gradually Appeared [name] [name] Wounding Event: Venous Leg Ulcer [name]  [name] Primary Etiology: Anemia, Sleep Apnea, Congestive [name] [name] Comorbid History: Heart Failure, Coronary Artery Disease, Hypertension, Peripheral Venous Disease, Type II Diabetes, Osteoarthritis, Neuropathy 09/07/2021 [name] [name] Date Acquired: 2 [name] [name] Weeks of Treatment: Open [name] [name] Wound Status: No [name] [name] Wound Recurrence: No [name] [name] Clustered Wound: [name] [name] [name] Clustered Quantity: 12.8x18x0.6 [name] [name] Measurements L x W x D (cm) 180.956 [name] [name] [name] (cm) : rea 108.573 [name] [name] Volume (cm) : 30.20% [name] [name] % Reduction in Area: -39.60% [name] [name] % Reduction in Volume: Full Thickness Without Exposed [name] [name] Classification: Support Structures Large [name] [name] Exudate Amount: Serosanguineous [name] [name] Exudate Type: red, brown [name] [name] Exudate Color: Distinct, outline attached [name] [name] Wound Margin: Medium (34-66%) [name] [name] Granulation Amount: Red [name] [name] Granulation Quality: Medium (34-66%) [name] [name] Necrotic Amount: Fat Layer (Subcutaneous Tissue): Yes [name] [name] Exposed Structures: Fascia: No Tendon: No Muscle: No Joint: No Bone: No Medium (34-66%) [name] [name] Epithelialization: Compression Therapy [name] [name] Procedures Performed: Treatment Notes Wound #24 (Lower Leg) Wound Laterality: Right, Anterior Cleanser Anasept Antimicrobial Skin and Wound Cleanser, 8 (oz) Discharge Instruction: Cleanse the wound with Anasept cleanser prior to applying [name] clean dressing using gauze sponges, not tissue or cotton balls. Peri-Wound Care Zinc Oxide Ointment 30g tube Discharge Instruction: Apply Zinc Oxide to periwound with each dressing change Topical Primary Dressing KerraCel Ag Gelling Fiber Dressing, 4x5 in (silver alginate) Discharge Instruction: Apply silver alginate to wound bed as instructed Secondary Dressing ABD Pad, 8x10 Discharge Instruction: Apply over primary dressing as directed. Secured With Transpore Surgical Tape, 2x10 (in/yd) Discharge Instruction: Secure  dressing with tape as directed. Compression Wrap ThreePress (3 layer compression wrap) Discharge Instruction: Apply three layer compression as directed. Compression Stockings Add-Ons Wound #25 (Lower Leg) Wound Laterality: Right, Medial Cleanser Anasept Antimicrobial Skin and Wound Cleanser, 8 (oz) Discharge Instruction: Cleanse the wound with Anasept cleanser prior to applying [name] clean dressing using gauze sponges, not tissue or cotton balls. Peri-Wound Care Zinc Oxide Ointment 30g tube Discharge Instruction: Apply Zinc Oxide to periwound with each dressing change Topical Primary Dressing KerraCel Ag Gelling Fiber Dressing, 4x5 in (silver alginate) Discharge Instruction: Apply silver alginate to wound bed as instructed Secondary Dressing ABD Pad, 8x10 Discharge Instruction: Apply over primary dressing as directed. Secured With Transpore Surgical Tape, 2x10 (in/yd) Discharge Instruction: Secure dressing with tape as directed. Compression Wrap ThreePress (3 layer compression wrap) Discharge Instruction: Apply three layer compression as directed. Compression Stockings Add-Ons Wound #26 (Lower Leg) Wound Laterality: Left, Posterior, Proximal Cleanser Anasept Antimicrobial Skin and Wound Cleanser, 8 (oz) Discharge Instruction: Cleanse the wound with Anasept cleanser prior to applying [name] clean dressing using gauze sponges, not tissue or cotton balls. Peri-Wound Care Zinc Oxide Ointment 30g tube Discharge Instruction: Apply Zinc Oxide to periwound with each dressing change Topical Primary Dressing KerraCel Ag Gelling Fiber Dressing, 4x5 in (silver alginate) Discharge Instruction: Apply silver alginate to wound bed as instructed Secondary Dressing ABD Pad, 8x10 Discharge Instruction: Apply over primary dressing as directed. Secured With Transpore Surgical Tape, 2x10 (in/yd) Discharge Instruction: Secure dressing with tape as directed. Compression Wrap ThreePress (3 layer  compression wrap) Discharge Instruction: Apply three layer compression as directed. Compression Stockings Add-Ons Wound #27 (Lower Leg) Wound Laterality: Left, Posterior Cleanser Anasept Antimicrobial Skin and Wound Cleanser,  8 (oz) Discharge Instruction: Cleanse the wound with Anasept cleanser prior to applying [name] clean dressing using gauze sponges, not tissue or cotton balls. Peri-Wound Care Zinc Oxide Ointment 30g tube Discharge Instruction: Apply Zinc Oxide to periwound with each dressing change Topical Primary Dressing KerraCel Ag Gelling Fiber Dressing, 4x5 in (silver alginate) Discharge Instruction: Apply silver alginate to wound bed as instructed Secondary Dressing ABD Pad, 8x10 Discharge Instruction: Apply over primary dressing as directed. Secured With Transpore Surgical Tape, 2x10 (in/yd) Discharge Instruction: Secure dressing with tape as directed. Compression Wrap ThreePress (3 layer compression wrap) Discharge Instruction: Apply three layer compression as directed. Compression Stockings Add-Ons Electronic Signature(s) Signed: 10/22/2021 12:39:32 PM By: [name] DO Signed: 10/22/2021 4:11:37 PM By: Fara Chute By: [name] on 10/22/2021 12:32:09 -------------------------------------------------------------------------------- Multi-Disciplinary Care Plan Details [name], [name] 10/22/2021 9:45 [name] M Medical Record Number: [id number] Patient Account Number: [id number] Date of Birth/Sex: Treating RN: 15-Dec-1955 (66 y.o. [name] Primary Care Ladelle Teodoro: [name] Other Clinician: Referring Shashana Fullington: Treating Muntaha Vermette/Extender: [name] in Treatment: 2 Multidisciplinary Care Plan reviewed with physician Active Inactive Venous Leg Ulcer Nursing Diagnoses: Actual venous Insuffiency (use after diagnosis is confirmed) Goals: Patient will maintain optimal edema  control Date Initiated: 10/08/2021 Target Resolution Date: 11/05/2021 Goal Status: Active Interventions: Compression as ordered Provide education on venous insufficiency Treatment Activities: Therapeutic compression applied : 10/08/2021 Notes: Wound/Skin Impairment Nursing Diagnoses: Impaired tissue integrity Goals: Patient/caregiver will verbalize understanding of skin care regimen Date Initiated: 10/08/2021 Target Resolution Date: 11/05/2021 Goal Status: Active Ulcer/skin breakdown will have [name] volume reduction of 30% by week 4 Date Initiated: 10/08/2021 Target Resolution Date: 11/05/2021 Goal Status: Active Interventions: Assess patient/caregiver ability to perform ulcer/skin care regimen upon admission and as needed Assess ulceration(s) every visit Provide education on ulcer and skin care Treatment Activities: Topical wound management initiated : 10/08/2021 Notes: Electronic Signature(s) Signed: 10/22/2021 5:28:28 PM By: Dellie Catholic RN Entered By: Dellie Catholic on 10/22/2021 10:50:09 -------------------------------------------------------------------------------- Pain Assessment Details [name], [name] 10/22/2021 9:45 [name] M Medical Record Number: [id number] Patient Account Number: [id number] Date of Birth/Sex: Treating RN: Aug 28, 1956 (66 y.o. [name] Primary Care Vinayak Bobier: [name] Other Clinician: Referring Omara Alcon: Treating Jazyiah Yiu/Extender: [name] in Treatment: 2 Active Problems Location of Pain Severity and Description of Pain Patient Has Paino No Site Locations Rate the pain. Current Pain Level: 0 Pain Management and Medication Current Pain Management: Medication: No Cold Application: No Rest: No Massage: No Activity: No T.E.N.S.: No Heat Application: No Leg drop or elevation: No Is the Current Pain Management Adequate: Adequate How does your wound impact your activities of  daily livingo Sleep: No Bathing: No Appetite: No Relationship With Others: No Bladder Continence: No Emotions: No Bowel Continence: No Work: No Toileting: No Drive: No Dressing: No Hobbies: No Notes Per patient pain to left hip with left leg movement. Electronic Signature(s) Signed: 10/22/2021 5:33:54 PM By: [name] RN, BSN Entered By: [name] on 10/22/2021 10:03:00 -------------------------------------------------------------------------------- Patient/Caregiver Education Details [name], [name] 1/30/2023andnbsp9:45 Dane Record Number: [id number] Patient Account Number: [id number] Date of Birth/Gender: Treating RN: 02-02-1956 (66 y.o. [name] Primary Care Physician: [name] Other Clinician: Referring Physician: Treating Physician/Extender: [name] in Treatment: 2 Education Assessment Education Provided To: Patient Education Topics Provided Wound/Skin Impairment: Methods: Explain/Verbal Responses: Return demonstration correctly Electronic Signature(s) Signed:  10/22/2021 5:28:28 PM By: Dellie Catholic RN Entered By: Dellie Catholic on 10/22/2021 10:11:40 -------------------------------------------------------------------------------- Wound Assessment Details [name], [name] 10/22/2021 9:45 [name] M Medical Record Number: [id number] Patient Account Number: [id number] Date of Birth/Sex: Treating RN: Feb 01, 1956 (66 y.o. [name] Primary Care Jackson Fetters: [name] Other Clinician: Referring Blanche Scovell: Treating Nyaira Hodgens/Extender: [name] in Treatment: 2 Wound Status Wound Number: 24 Primary Venous Leg Ulcer Etiology: Wound Location: Right, Anterior Lower Leg Wound Open Wounding Event: Gradually Appeared Status: Date Acquired: 09/07/2021 Comorbid Anemia, Sleep Apnea, Congestive Heart Failure, Coronary  Artery Weeks Of Treatment: 2 History: Disease, Hypertension, Peripheral Venous Disease, Type II Clustered Wound: Yes Diabetes, Osteoarthritis, Neuropathy Photos Wound Measurements Length: (cm) 15 Width: (cm) 15 Depth: (cm) 0.1 Clustered Quantity: 4 Area: (cm) 176.715 Volume: (cm) 17.671 % Reduction in Area: 20.1% % Reduction in Volume: 20.1% Epithelialization: Medium (34-66%) Tunneling: No Undermining: No Wound Description Classification: Full Thickness Without Exposed Support Structures Wound Margin: Distinct, outline attached Exudate Amount: Large Exudate Type: Serous Exudate Color: amber Foul Odor After Cleansing: No Slough/Fibrino No Wound Bed Granulation Amount: Large (67-100%) Exposed Structure Granulation Quality: Pink Fascia Exposed: No Necrotic Amount: None Present (0%) Fat Layer (Subcutaneous Tissue) Exposed: Yes Tendon Exposed: No Muscle Exposed: No Joint Exposed: No Bone Exposed: No Treatment Notes Wound #24 (Lower Leg) Wound Laterality: Right, Anterior Cleanser Anasept Antimicrobial Skin and Wound Cleanser, 8 (oz) Discharge Instruction: Cleanse the wound with Anasept cleanser prior to applying [name] clean dressing using gauze sponges, not tissue or cotton balls. Peri-Wound Care Zinc Oxide Ointment 30g tube Discharge Instruction: Apply Zinc Oxide to periwound with each dressing change Topical Primary Dressing KerraCel Ag Gelling Fiber Dressing, 4x5 in (silver alginate) Discharge Instruction: Apply silver alginate to wound bed as instructed Secondary Dressing ABD Pad, 8x10 Discharge Instruction: Apply over primary dressing as directed. Secured With Transpore Surgical Tape, 2x10 (in/yd) Discharge Instruction: Secure dressing with tape as directed. Compression Wrap ThreePress (3 layer compression wrap) Discharge Instruction: Apply three layer compression as directed. Compression Stockings Add-Ons Electronic Signature(s) Signed: 10/22/2021 5:28:28 PM  By: Dellie Catholic RN Signed: 10/22/2021 5:33:54 PM By: [name] RN, BSN Signed: 10/22/2021 5:33:54 PM By: [name] RN, BSN Entered By: Dellie Catholic on 10/22/2021 10:02:06 -------------------------------------------------------------------------------- Wound Assessment Details [name], [name] 10/22/2021 9:45 [name] M Medical Record Number: [id number] Patient Account Number: [id number] Date of Birth/Sex: Treating RN: June 06, 1956 (66 y.o. [name], Meta.Reding Primary Care Tessia Kassin: [name] Other Clinician: Referring Beryl Balz: Treating Takesha Steger/Extender: [name] in Treatment: 2 Wound Status Wound Number: 25 Primary Venous Leg Ulcer Etiology: Wound Location: Right, Medial Lower Leg Wound Open Wounding Event: Gradually Appeared Status: Date Acquired: 09/07/2021 Comorbid Anemia, Sleep Apnea, Congestive Heart Failure, Coronary Artery Weeks Of Treatment: 2 History: Disease, Hypertension, Peripheral Venous Disease, Type II Clustered Wound: No Diabetes, Osteoarthritis, Neuropathy Photos Photo Uploaded By: Donavan Burnet on 10/22/2021 10:22:30 Wound Measurements Length: (cm) 5.5 Width: (cm) 3.8 Depth: (cm) 0.3 Area: (cm) 16.415 Volume: (cm) 4.924 % Reduction in Area: -36.6% % Reduction in Volume: -36.6% Epithelialization: None Tunneling: No Wound Description Classification: Full Thickness Without Exposed Support Structures Wound Margin: Thickened Exudate Amount: Medium Exudate Type: Serosanguineous Exudate Color: red, brown Foul Odor After Cleansing: No Slough/Fibrino Yes Wound Bed Granulation Amount: Medium (34-66%) Exposed Structure Granulation Quality: Red Fascia Exposed: No Necrotic Amount: Medium (34-66%) Fat Layer (Subcutaneous Tissue) Exposed: Yes Necrotic Quality: Adherent Slough Tendon Exposed: No Muscle  Exposed: No Joint Exposed: No Bone Exposed: No Treatment Notes Wound #25 (Lower  Leg) Wound Laterality: Right, Medial Cleanser Anasept Antimicrobial Skin and Wound Cleanser, 8 (oz) Discharge Instruction: Cleanse the wound with Anasept cleanser prior to applying [name] clean dressing using gauze sponges, not tissue or cotton balls. Peri-Wound Care Zinc Oxide Ointment 30g tube Discharge Instruction: Apply Zinc Oxide to periwound with each dressing change Topical Primary Dressing KerraCel Ag Gelling Fiber Dressing, 4x5 in (silver alginate) Discharge Instruction: Apply silver alginate to wound bed as instructed Secondary Dressing ABD Pad, 8x10 Discharge Instruction: Apply over primary dressing as directed. Secured With Transpore Surgical Tape, 2x10 (in/yd) Discharge Instruction: Secure dressing with tape as directed. Compression Wrap ThreePress (3 layer compression wrap) Discharge Instruction: Apply three layer compression as directed. Compression Stockings Add-Ons Electronic Signature(s) Signed: 10/22/2021 5:33:54 PM By: [name] RN, BSN Entered By: [name] on 10/22/2021 09:59:19 -------------------------------------------------------------------------------- Wound Assessment Details [name], [name] 10/22/2021 9:45 [name] M Medical Record Number: [id number] Patient Account Number: [id number] Date of Birth/Sex: Treating RN: [birth year] (66 y.o. [name], Meta.Reding Primary Care Rhylynn Perdomo: [name] Other Clinician: Referring Christan Defranco: Treating Dondre Catalfamo/Extender: [name] in Treatment: 2 Wound Status Wound Number: 26 Primary Venous Leg Ulcer Etiology: Wound Location: Left, Proximal, Posterior Lower Leg Wound Open Wounding Event: Gradually Appeared Status: Date Acquired: 09/07/2021 Comorbid Anemia, Sleep Apnea, Congestive Heart Failure, Coronary Artery Weeks Of Treatment: 2 History: Disease, Hypertension, Peripheral Venous Disease, Type II Clustered Wound: No Diabetes, Osteoarthritis,  Neuropathy Photos Photo Uploaded By: Donavan Burnet on 10/22/2021 10:22:30 Wound Measurements Length: (cm) 7 Width: (cm) 8 Depth: (cm) 0.1 Area: (cm) 43.982 Volume: (cm) 4.398 % Reduction in Area: 44.7% % Reduction in Volume: 81.6% Epithelialization: Medium (34-66%) Undermining: No Wound Description Classification: Full Thickness Without Exposed Support Structures Wound Margin: Distinct, outline attached Exudate Amount: Medium Exudate Type: Serosanguineous Exudate Color: red, brown Foul Odor After Cleansing: No Slough/Fibrino Yes Wound Bed Granulation Amount: Large (67-100%) Exposed Structure Granulation Quality: Red Fascia Exposed: No Necrotic Amount: Small (1-33%) Fat Layer (Subcutaneous Tissue) Exposed: Yes Necrotic Quality: Adherent Slough Tendon Exposed: No Muscle Exposed: No Joint Exposed: No Bone Exposed: No Treatment Notes Wound #26 (Lower Leg) Wound Laterality: Left, Posterior, Proximal Cleanser Anasept Antimicrobial Skin and Wound Cleanser, 8 (oz) Discharge Instruction: Cleanse the wound with Anasept cleanser prior to applying [name] clean dressing using gauze sponges, not tissue or cotton balls. Peri-Wound Care Zinc Oxide Ointment 30g tube Discharge Instruction: Apply Zinc Oxide to periwound with each dressing change Topical Primary Dressing KerraCel Ag Gelling Fiber Dressing, 4x5 in (silver alginate) Discharge Instruction: Apply silver alginate to wound bed as instructed Secondary Dressing ABD Pad, 8x10 Discharge Instruction: Apply over primary dressing as directed. Secured With Transpore Surgical Tape, 2x10 (in/yd) Discharge Instruction: Secure dressing with tape as directed. Compression Wrap ThreePress (3 layer compression wrap) Discharge Instruction: Apply three layer compression as directed. Compression Stockings Add-Ons Electronic Signature(s) Signed: 10/22/2021 5:33:54 PM By: [name] RN, BSN Entered By: [name] on 10/22/2021  09:59:54 -------------------------------------------------------------------------------- Wound Assessment Details [name], [name] 10/22/2021 9:45 [name] M Medical Record Number: [id number] Patient Account Number: [id number] Date of Birth/Sex: Treating RN: 1956/06/19 (66 y.o. [name] Primary Care Irvine Glorioso: [name] Other Clinician: Referring Horrace Hanak: Treating Kenyah Luba/Extender: [name] in Treatment: 2 Wound Status Wound Number: 27 Primary Venous Leg Ulcer Etiology: Wound Location: Left, Posterior Lower Leg Wound Open Wounding Event: Gradually Appeared  Status: Date Acquired: 09/07/2021 Comorbid Anemia, Sleep Apnea, Congestive Heart Failure, Coronary Artery Weeks Of Treatment: 2 History: Disease, Hypertension, Peripheral Venous Disease, Type II Clustered Wound: No Diabetes, Osteoarthritis, Neuropathy Photos Photo Uploaded By: Donavan Burnet on 10/22/2021 10:22:47 Wound Measurements Length: (cm) 12.8 Width: (cm) 18 Depth: (cm) 0.6 Area: (cm) 180.956 Volume: (cm) 108.573 % Reduction in Area: 30.2% % Reduction in Volume: -39.6% Epithelialization: Medium (34-66%) Tunneling: No Undermining: No Wound Description Classification: Full Thickness Without Exposed Support Structures Wound Margin: Distinct, outline attached Exudate Amount: Large Exudate Type: Serosanguineous Exudate Color: red, brown Foul Odor After Cleansing: No Slough/Fibrino Yes Wound Bed Granulation Amount: Medium (34-66%) Exposed Structure Granulation Quality: Red Fascia Exposed: No Necrotic Amount: Medium (34-66%) Fat Layer (Subcutaneous Tissue) Exposed: Yes Necrotic Quality: Adherent Slough Tendon Exposed: No Muscle Exposed: No Joint Exposed: No Bone Exposed: No Treatment Notes Wound #27 (Lower Leg) Wound Laterality: Left, Posterior Cleanser Anasept Antimicrobial Skin and Wound Cleanser, 8 (oz) Discharge Instruction:  Cleanse the wound with Anasept cleanser prior to applying [name] clean dressing using gauze sponges, not tissue or cotton balls. Peri-Wound Care Zinc Oxide Ointment 30g tube Discharge Instruction: Apply Zinc Oxide to periwound with each dressing change Topical Primary Dressing KerraCel Ag Gelling Fiber Dressing, 4x5 in (silver alginate) Discharge Instruction: Apply silver alginate to wound bed as instructed Secondary Dressing ABD Pad, 8x10 Discharge Instruction: Apply over primary dressing as directed. Secured With Transpore Surgical Tape, 2x10 (in/yd) Discharge Instruction: Secure dressing with tape as directed. Compression Wrap ThreePress (3 layer compression wrap) Discharge Instruction: Apply three layer compression as directed. Compression Stockings Add-Ons Electronic Signature(s) Signed: 10/22/2021 5:33:54 PM By: [name] RN, BSN Entered By: [name] on 10/22/2021 10:00:24 -------------------------------------------------------------------------------- Vitals Details [name], [name] 10/22/2021 9:45 [name] M Medical Record Number: [id number] Patient Account Number: [id number] Date of Birth/Sex: Treating RN: 03/15/1956 (66 y.o. [name] Primary Care Yahia Bottger: [name] Other Clinician: Referring Storie Heffern: Treating Rafaelita Foister/Extender: [name] in Treatment: 2 Vital Signs Time Taken: 10:00 Temperature (F): 98.3 Height (in): 77 Pulse (bpm): 77 Weight (lbs): 258 Respiratory Rate (breaths/min): 16 Body Mass Index (BMI): 30.6 Blood Pressure (mmHg): 97/64 Reference Range: 80 - 120 mg / dl Notes Patient seems lethargic at times. Patient will arousal when spoken to and when painful stimuli when picking up or moving the left leg. Electronic Signature(s) Signed: 10/22/2021 5:33:54 PM By: [name] RN, BSN Entered By: [name] on 10/22/2021 10:02:35

## 2021-10-24 ENCOUNTER — Emergency Department (HOSPITAL_COMMUNITY): Payer: No Typology Code available for payment source

## 2021-10-24 ENCOUNTER — Emergency Department (HOSPITAL_COMMUNITY)
Admission: EM | Admit: 2021-10-24 | Discharge: 2021-10-24 | Disposition: A | Discharge status: Skilled Nursing Facility | Payer: No Typology Code available for payment source | Attending: Student | Admitting: Student

## 2021-10-24 DIAGNOSIS — Z7982 Long term (current) use of aspirin: Secondary | ICD-10-CM | POA: Insufficient documentation

## 2021-10-24 DIAGNOSIS — E860 Dehydration: Secondary | ICD-10-CM | POA: Insufficient documentation

## 2021-10-24 DIAGNOSIS — Z794 Long term (current) use of insulin: Secondary | ICD-10-CM | POA: Insufficient documentation

## 2021-10-24 DIAGNOSIS — Z79899 Other long term (current) drug therapy: Secondary | ICD-10-CM | POA: Diagnosis not present

## 2021-10-24 DIAGNOSIS — Z7984 Long term (current) use of oral hypoglycemic drugs: Secondary | ICD-10-CM | POA: Insufficient documentation

## 2021-10-24 DIAGNOSIS — R300 Dysuria: Secondary | ICD-10-CM | POA: Diagnosis present

## 2021-10-24 DIAGNOSIS — R0602 Shortness of breath: Secondary | ICD-10-CM

## 2021-10-24 LAB — COMPREHENSIVE METABOLIC PANEL
ALT: 21 U/L (ref 0–44)
AST: 27 U/L (ref 15–41)
Albumin: 2.3 g/dL — ABNORMAL LOW (ref 3.5–5.0)
Alkaline Phosphatase: 93 U/L (ref 38–126)
Anion gap: 8 (ref 5–15)
BUN: 72 mg/dL — ABNORMAL HIGH (ref 8–23)
CO2: 30 mmol/L (ref 22–32)
Calcium: 8.8 mg/dL — ABNORMAL LOW (ref 8.9–10.3)
Chloride: 103 mmol/L (ref 98–111)
Creatinine, Ser: 1.87 mg/dL — ABNORMAL HIGH (ref 0.61–1.24)
GFR, Estimated: 39 mL/min — ABNORMAL LOW (ref >60)
Glucose, Bld: 91 mg/dL (ref 70–99)
Potassium: 5.3 mmol/L — ABNORMAL HIGH (ref 3.5–5.1)
Sodium: 141 mmol/L (ref 135–145)
Total Bilirubin: 0.3 mg/dL (ref 0.3–1.2)
Total Protein: 7.8 g/dL (ref 6.5–8.1)

## 2021-10-24 LAB — CBC WITH DIFFERENTIAL/PLATELET
Abs Immature Granulocytes: 0.03 10*3/uL (ref 0.00–0.07)
Basophils Absolute: 0 10*3/uL (ref 0.0–0.1)
Basophils Relative: 0 %
Eosinophils Absolute: 0.2 10*3/uL (ref 0.0–0.5)
Eosinophils Relative: 2 %
HCT: 35.3 % — ABNORMAL LOW (ref 39.0–52.0)
Hemoglobin: 10.1 g/dL — ABNORMAL LOW (ref 13.0–17.0)
Immature Granulocytes: 0 %
Lymphocytes Relative: 17 %
Lymphs Abs: 1.4 10*3/uL (ref 0.7–4.0)
MCH: 19.6 pg — ABNORMAL LOW (ref 26.0–34.0)
MCHC: 28.6 g/dL — ABNORMAL LOW (ref 30.0–36.0)
MCV: 68.4 fL — ABNORMAL LOW (ref 80.0–100.0)
Monocytes Absolute: 0.6 10*3/uL (ref 0.1–1.0)
Monocytes Relative: 7 %
Neutro Abs: 6.3 10*3/uL (ref 1.7–7.7)
Neutrophils Relative %: 74 %
Platelets: 301 10*3/uL (ref 150–400)
RBC: 5.16 MIL/uL (ref 4.22–5.81)
RDW: 19 % — ABNORMAL HIGH (ref 11.5–15.5)
WBC: 8.6 10*3/uL (ref 4.0–10.5)
nRBC: 0 % (ref 0.0–0.2)

## 2021-10-24 LAB — BASIC METABOLIC PANEL
Anion gap: 7 (ref 5–15)
BUN: 59 mg/dL — ABNORMAL HIGH (ref 8–23)
CO2: 32 mmol/L (ref 22–32)
Calcium: 8.9 mg/dL (ref 8.9–10.3)
Chloride: 103 mmol/L (ref 98–111)
Creatinine, Ser: 1.52 mg/dL — ABNORMAL HIGH (ref 0.61–1.24)
GFR, Estimated: 50 mL/min — ABNORMAL LOW (ref >60)
Glucose, Bld: 108 mg/dL — ABNORMAL HIGH (ref 70–99)
Potassium: 5.3 mmol/L — ABNORMAL HIGH (ref 3.5–5.1)
Sodium: 142 mmol/L (ref 135–145)

## 2021-10-24 LAB — URINALYSIS, ROUTINE W REFLEX MICROSCOPIC
Bilirubin Urine: NEGATIVE
Glucose, UA: NEGATIVE mg/dL
Hgb urine dipstick: NEGATIVE
Ketones, ur: NEGATIVE mg/dL
Leukocytes,Ua: NEGATIVE
Nitrite: NEGATIVE
Protein, ur: NEGATIVE mg/dL
Specific Gravity, Urine: 1.02 (ref 1.005–1.030)
pH: 5.5 (ref 5.0–8.0)

## 2021-10-24 LAB — CK: Total CK: 317 U/L (ref 49–397)

## 2021-10-24 LAB — MAGNESIUM: Magnesium: 1.9 mg/dL (ref 1.7–2.4)

## 2021-10-24 MED ORDER — SODIUM ZIRCONIUM CYCLOSILICATE 10 G PO PACK
10.0000 g | PACK | Freq: Every day | ORAL | Status: DC
  Administered 2021-10-24: 10 g via ORAL
  Filled 2021-10-24: qty 1

## 2021-10-24 MED ORDER — SODIUM CHLORIDE 0.9 % IV BOLUS: 500.0000 mL | Freq: Once | INTRAVENOUS | Status: DC

## 2021-10-24 MED ORDER — SODIUM CHLORIDE 0.9 % IV BOLUS: 1000.0000 mL | Freq: Once | INTRAVENOUS | Status: DC

## 2021-10-24 NOTE — ED Notes (Signed)
Pt saturated in urine dripping on waiting room floor. Pt changed by my self and Hailey B. Condom cath. Applied to pt with drainage bag.

## 2021-10-24 NOTE — ED Notes (Signed)
Pt transported to xray 

## 2021-10-24 NOTE — ED Provider Notes (Signed)
[facility] EMERGENCY DEPARTMENT Provider Note   CSN: [id number] Arrival date & time: 10/24/21  0003     History  Chief Complaint  Patient presents with   Leg Pain    [name] is a 66 y.o. male.  66 year old male brought in by EMS from [place] facility with complaint of dysuria.  Per EMS report in triage, patient has bilateral leg wounds, dressings changed Monday, Wednesday, Friday, ongoing for several months.  Patient reported difficulty urinating with groin pain when he is able to void, currently has a condom cath on and denies any urinary complaints at this time.  Patient states he is not felt well for the past few days, has not been eating or drinking well at the facility although has continued to take his medications.      Home Medications Prior to Admission medications   Medication Sig Start Date End Date Taking? Authorizing Provider  acetaminophen (TYLENOL) 325 MG tablet Take 2 tablets (650 mg total) by mouth every 6 (six) hours as needed for mild pain (or Fever >/= 101). 03/23/15   Robbie Lis, MD  Amino Acids-Protein Hydrolys (FEEDING SUPPLEMENT, PRO-STAT 64,) LIQD Take 45 mLs by mouth 2 (two) times daily.    [provider]  ARIPiprazole (ABILIFY) 2 MG tablet Take 2 mg by mouth at bedtime. 04/27/21   [provider]  aspirin EC 81 MG tablet Take 81 mg by mouth every morning.    [provider]  benzocaine (ORAJEL) 10 % mucosal gel Use as directed 1 application in the mouth or throat every 2 (two) hours as needed for mouth pain (soreness).    [provider]  betamethasone dipropionate 0.05 % lotion Apply 1 application topically every 12 (twelve) hours as needed (itching). 06/07/21   [provider]  chlorhexidine (PERIDEX) 0.12 % solution Use as directed 5 mLs in the mouth or throat 2 (two) times daily.    [provider]  Cholecalciferol (VITAMIN D) 50 MCG (2000 UT) tablet Take 2,000 Units  by mouth every morning.    [provider]  docusate sodium (COLACE) 100 MG capsule Take 1 capsule (100 mg total) by mouth 2 (two) times daily as needed for mild constipation. 09/10/21   Pokhrel, Corrie Mckusick, MD  Ensure Max Protein (ENSURE MAX PROTEIN) LIQD Take 330 mLs (11 oz total) by mouth daily. 09/10/21   Pokhrel, Corrie Mckusick, MD  feeding supplement, GLUCERNA SHAKE, (GLUCERNA SHAKE) LIQD Take 237 mLs by mouth daily. 09/11/21   Pokhrel, Corrie Mckusick, MD  ferrous sulfate 325 (65 FE) MG tablet Take 325 mg by mouth every morning.    [provider]  furosemide (LASIX) 20 MG tablet Take 60 mg by mouth 2 (two) times daily. 05/16/21   [provider]  gabapentin (NEURONTIN) 300 MG capsule Take 300 mg by mouth 3 (three) times daily. 05/16/21   [provider]  Glucagon HCl (GLUCAGON EMERGENCY) 1 MG/ML SOLR Inject 1 mg into the muscle once as needed (hypoglycemia).    [provider]  hydrocortisone (ANUSOL-HC) 2.5 % rectal cream Place 1 application rectally daily as needed for hemorrhoids or anal itching.    [provider]  Insulin Glargine (BASAGLAR KWIKPEN) 100 UNIT/ML Inject 30 Units into the skin 2 (two) times daily. 06/11/21   [provider]  insulin regular (NOVOLIN R) 100 units/mL injection Inject 2-10 Units into the skin See admin instructions. Inject 2-10 units subcutaneously four times daily per sliding scale: CBG 140-199 2  units, 200-250 4 units, 251-299 6 units, 300-349 8 units, 350-400 10 units, above 400 call MD for coverage    [provider]  linagliptin (TRADJENTA) 5 MG TABS tablet Take 5 mg by mouth every morning.    [provider]  lisinopril (PRINIVIL,ZESTRIL) 2.5 MG tablet Take 1 tablet (2.5 mg total) by mouth daily. Patient taking differently: Take 2.5 mg by mouth every morning. 11/13/15   Thurnell Lose, MD  melatonin 3 MG TABS tablet Take 3 mg by mouth at bedtime.    [provider]  metFORMIN (GLUCOPHAGE)  500 MG tablet Take 500 mg by mouth 2 (two) times daily with a meal. 10/15/17   [provider]  Multiple Vitamin (MULTIVITAMIN WITH MINERALS) TABS tablet Take 1 tablet by mouth daily. Patient taking differently: Take 1 tablet by mouth every morning. 03/23/15   Robbie Lis, MD  nitroGLYCERIN (NITROSTAT) 0.4 MG SL tablet Place 0.4 mg under the tongue every 5 (five) minutes as needed for chest pain.    [provider]  Nutritional Supplements (,FEEDING SUPPLEMENT, PROSOURCE PLUS) liquid Take 30 mLs by mouth daily. 09/10/21   Pokhrel, Corrie Mckusick, MD  nystatin (MYCOSTATIN/NYSTOP) powder Apply topically 2 (two) times daily. Apply to folds 09/10/21   Pokhrel, Laxman, MD  pantoprazole (PROTONIX) 40 MG tablet Take 1 tablet (40 mg total) by mouth daily. 09/11/21   Pokhrel, Corrie Mckusick, MD  polyethylene glycol (MIRALAX / GLYCOLAX) 17 g packet Take 17 g by mouth daily as needed for moderate constipation. 09/10/21   Pokhrel, Corrie Mckusick, MD  Psyllium (METAMUCIL FIBER SINGLES PO) Take 2 Wafers by mouth 2 (two) times daily.    [provider]  tamsulosin (FLOMAX) 0.4 MG CAPS capsule Take 0.8 mg by mouth at bedtime.    [provider]  tiZANidine (ZANAFLEX) 4 MG tablet Take 4 mg by mouth every 8 (eight) hours as needed for muscle spasms.    [provider]  traZODone (DESYREL) 50 MG tablet Take 50 mg by mouth at bedtime. 06/08/21   [provider]  triamcinolone cream (KENALOG) 0.1 % Apply 1 application topically 2 (two) times daily. For rash 06/18/21   [provider]  Vilazodone HCl (VIIBRYD) 40 MG TABS Take 40 mg by mouth every morning.    [provider]      Allergies    Other and Sulfa antibiotics    Review of Systems   Review of Systems  Constitutional:  Negative for chills and fever.  Respiratory:  Negative for cough and shortness of breath.   Cardiovascular:  Negative for chest pain.  Gastrointestinal:  Negative for abdominal pain,  constipation, diarrhea, nausea and vomiting.  Genitourinary:  Positive for dysuria.  Musculoskeletal:  Negative for arthralgias and myalgias.  Skin:  Positive for wound.  Allergic/Immunologic: Positive for immunocompromised state.  Neurological:  Negative for weakness.  Hematological:  Negative for adenopathy.  Psychiatric/Behavioral:  Negative for confusion.    Physical Exam Updated Vital Signs BP 140/61 (BP Location: Left Arm)    Pulse 79    Temp 98 F (36.7 C)    Resp 15    SpO2 97%  Physical Exam Vitals and nursing note reviewed.  Constitutional:      General: He is not in acute distress.    Appearance: He is well-developed. He is not diaphoretic.  HENT:     Head: Normocephalic and atraumatic.     Mouth/Throat:     Mouth: Mucous membranes are dry.  Eyes:  Conjunctiva/sclera: Conjunctivae normal.  Cardiovascular:     Rate and Rhythm: Normal rate and regular rhythm.     Heart sounds: Normal heart sounds.  Pulmonary:     Effort: Pulmonary effort is normal.     Breath sounds: Normal breath sounds.  Abdominal:     Palpations: Abdomen is soft.     Tenderness: There is no abdominal tenderness.  Musculoskeletal:        General: No tenderness.     Cervical back: Neck supple.     Comments: Dressings applied to bilateral lower extremities, clean, dry, intact.  Skin:    General: Skin is warm and dry.  Neurological:     Mental Status: He is alert and oriented to person, place, and time.  Psychiatric:        Behavior: Behavior normal.    ED Results / Procedures / Treatments   Labs (all labs ordered are listed, but only abnormal results are displayed) Labs Reviewed  COMPREHENSIVE METABOLIC PANEL - Abnormal; Notable for the following components:      Result Value   Potassium 5.3 (*)    BUN 72 (*)    Creatinine, Ser 1.87 (*)    Calcium 8.8 (*)    Albumin 2.3 (*)    GFR, Estimated 39 (*)    All other components within normal limits  CBC WITH DIFFERENTIAL/PLATELET -  Abnormal; Notable for the following components:   Hemoglobin 10.1 (*)    HCT 35.3 (*)    MCV 68.4 (*)    MCH 19.6 (*)    MCHC 28.6 (*)    RDW 19.0 (*)    All other components within normal limits  BASIC METABOLIC PANEL - Abnormal; Notable for the following components:   Potassium 5.3 (*)    Glucose, Bld 108 (*)    BUN 59 (*)    Creatinine, Ser 1.52 (*)    GFR, Estimated 50 (*)    All other components within normal limits  URINE CULTURE  URINALYSIS, ROUTINE W REFLEX MICROSCOPIC  CK  MAGNESIUM    EKG None  Radiology DG Chest 1 View  Result Date: 10/24/2021 CLINICAL DATA:  Shortness of breath. EXAM: CHEST  1 VIEW COMPARISON:  09/03/2021.  Chest CT dated 09/04/2021 FINDINGS: The cardiac silhouette remains mildly enlarged. Stable elevation of the right hemidiaphragm. Small amount of residual linear scarring or atelectasis at the right lung base. Otherwise, clear lungs. Normal vascularity taking into account the poor inspiration. No visible pleural fluid. Unremarkable bones. IMPRESSION: No acute abnormality. Electronically Signed   By: Claudie Revering M.D.   On: 10/24/2021 12:34    Procedures Procedures    Medications Ordered in ED Medications  sodium chloride 0.9 % bolus 1,000 mL (0 mLs Intravenous Hold 10/24/21 1417)    ED Course/ Medical Decision Making/ A&P Clinical Course as of 10/24/21 1500  Wed Oct 24, 1461  [id number] 66 year old male from [place] home with report of dysuria.  Patient states that he has not felt well for the past few days and has not been eating or drinking.  Patient was triaged, labs obtained on arrival and placed in the lobby while waiting room.  While in the lobby, voided resulting in needing to be changed and condom cath placed.  Review of lab work, CBC with normal white blood cell count, anemia with hemoglobin of 10.1, not significant change from prior.  CMP with slight increase in creatinine to 1.87, previously 0.9 with increase in potassium to 5.3.  Suspect patient's increase in creatinine today is due to not feeling well for the past few days, eating or drinking while continuing to take his home meds including his Lasix. Patient is afebrile.  O2 sat 97% on his 2 L, baseline 3 L PRN. Past medical history of diabetes, CHF with EF of 50 to 55% on last echo dated September 04, 2021. Case discussed with Dr. Matilde Sprang, ER attending. Plan is to hydrate for his AKI, assess for possible UTI and treat if indicated. Recheck BMP and possibly dc back to facility if improving.  [LM]  1500 CK is normal, magnesium within normal notes.  Urinalysis is unremarkable.  Patient is tolerating oral fluids with improvement in his creatinine down to 1.52.  Plan is to discharge back to facility for ongoing monitoring and hydration.  Return to ER for worsening or concerning symptoms. [LM]    Clinical Course User Index [LM] Tacy Learn, PA-C                           Medical Decision Making Amount and/or Complexity of Data Reviewed Labs: ordered. Radiology: ordered.          Final Clinical Impression(s) / ED Diagnoses Final diagnoses:  Dehydration    Rx / DC Orders ED Discharge Orders     None         Tacy Learn, PA-C 10/24/21 1501    Teressa Lower, MD 10/24/21 1540

## 2021-10-24 NOTE — ED Provider Triage Note (Signed)
Emergency Medicine Provider Triage Evaluation Note  [name] , a 66 y.o. male  was evaluated in triage.  Pt complains of dysuria as well as leg pain.  Patient has a history of stasis edema both lower extremities as well as chronic wounds to the lower legs.  He lives at [facility] and states that he has having dressing changes in the legs 3 times per week.  States he has had increasing pain in the wounds of his legs.  Also notes intermittent dysuria as well as difficulty urinating.  EMS noted the patient wears 3 L via nasal cannula as needed and was found to be 77% on room air and was placed on 3 L via nasal cannula which improved to 97%.  Denies fevers, chills, nausea, vomiting, diarrhea.  Physical Exam  There were no vitals taken for this visit. Gen:   Awake, no distress   Resp:  Normal effort  MSK:   Moves extremities without difficulty  Other:    Medical Decision Making  Medically screening exam initiated at 12:05 AM.  Appropriate orders placed.  Shawne Eskelson was informed that the remainder of the evaluation will be completed by another provider, this initial triage assessment does not replace that evaluation, and the importance of remaining in the ED until their evaluation is complete.   Rayna Sexton, PA-C 10/24/21 [phone]

## 2021-10-24 NOTE — ED Notes (Signed)
Oxygen tank replaced °

## 2021-10-24 NOTE — ED Notes (Signed)
Bladder scanned PT with a result of 4mL

## 2021-10-24 NOTE — ED Triage Notes (Signed)
Pt from [facility] via [facility] for eval of 10/10 bilateral leg pain, wounds to both- wrapped "for months, changed MWF." Pt also reports difficulty urinating, groin pain when able to urinate. Hx intermittent chronic sciatic back pain. Hx CHF, DM   140/80 HR 82 SpO2 77% RA, 3L [place] PRN, 97% when on 3L

## 2021-10-24 NOTE — ED Notes (Signed)
[facility] called  

## 2021-10-24 NOTE — Discharge Instructions (Signed)
Continue to monitor renal function.  Increase p.o. intake.  Return to ER for worsening or concerning symptoms.

## 2021-10-25 LAB — URINE CULTURE

## 2021-10-29 ENCOUNTER — Encounter (HOSPITAL_BASED_OUTPATIENT_CLINIC_OR_DEPARTMENT_OTHER): Payer: Medicare Other | Attending: Internal Medicine | Admitting: Internal Medicine

## 2021-10-29 ENCOUNTER — Other Ambulatory Visit: Payer: Self-pay

## 2021-10-29 DIAGNOSIS — I87333 Chronic venous hypertension (idiopathic) with ulcer and inflammation of bilateral lower extremity: Secondary | ICD-10-CM | POA: Diagnosis not present

## 2021-10-29 DIAGNOSIS — I872 Venous insufficiency (chronic) (peripheral): Secondary | ICD-10-CM | POA: Diagnosis not present

## 2021-10-29 DIAGNOSIS — L97912 Non-pressure chronic ulcer of unspecified part of right lower leg with fat layer exposed: Secondary | ICD-10-CM | POA: Diagnosis not present

## 2021-10-29 DIAGNOSIS — I5032 Chronic diastolic (congestive) heart failure: Secondary | ICD-10-CM | POA: Diagnosis not present

## 2021-10-29 DIAGNOSIS — I2510 Atherosclerotic heart disease of native coronary artery without angina pectoris: Secondary | ICD-10-CM | POA: Diagnosis not present

## 2021-10-29 DIAGNOSIS — E11622 Type 2 diabetes mellitus with other skin ulcer: Secondary | ICD-10-CM | POA: Insufficient documentation

## 2021-10-29 DIAGNOSIS — M1990 Unspecified osteoarthritis, unspecified site: Secondary | ICD-10-CM | POA: Diagnosis not present

## 2021-10-29 DIAGNOSIS — E1140 Type 2 diabetes mellitus with diabetic neuropathy, unspecified: Secondary | ICD-10-CM | POA: Diagnosis not present

## 2021-10-29 DIAGNOSIS — E1151 Type 2 diabetes mellitus with diabetic peripheral angiopathy without gangrene: Secondary | ICD-10-CM | POA: Insufficient documentation

## 2021-10-29 DIAGNOSIS — I110 Hypertensive heart disease with heart failure: Secondary | ICD-10-CM | POA: Insufficient documentation

## 2021-10-29 DIAGNOSIS — I890 Lymphedema, not elsewhere classified: Secondary | ICD-10-CM | POA: Insufficient documentation

## 2021-10-29 DIAGNOSIS — L97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ed: Secondary | ICD-10-CM | POA: Diagnosis not present

## 2021-10-29 NOTE — Progress Notes (Signed)
[name], [name] ([id number]) Visit Report for 10/29/2021 Chief Complaint Document Details Patient Name: Date of Service: [name] 10/29/2021 8:45 A M Medical Record Number: [id number] Patient Account Number: [id number] Date of Birth/Sex: Treating RN: 1956/07/04 (66 y.o. [name] Primary Care Provider: [name] Other Clinician: Referring Provider: Treating Provider/Extender: [name] in Treatment: 3 Information Obtained from: Patient Chief Complaint 10/08/2021; bilateral lower extremity wounds Electronic Signature(s) Signed: 10/29/2021 9:58:24 AM By: [name] DO Entered By: [name] on 10/29/2021 09:50:23 -------------------------------------------------------------------------------- HPI Details Patient Name: Date of Service: [name] 10/29/2021 8:45 A M Medical Record Number: [id number] Patient Account Number: [id number] Date of Birth/Sex: Treating RN: Aug 31, 1956 (66 y.o. [name] Primary Care Provider: [name] Other Clinician: Referring Provider: Treating Provider/Extender: [name] in Treatment: 3 History of Present Illness HPI Description: 07/07/15; this is a patient who is in hospital on 8/2 through 8/4. He had cellulitis and abscess of predominantly I think the left leg. He received IV antibiotics. Plain x-ray showed no osteomyelitis. An MRI of the left leg did not show osteomyelitis. Cultures showed no predominant organism. His hemoglobin A1c was 9.8. He has a history of venous stasis also peripheral vascular disease. He was discharged to [facility]. He really has extensive ulcerations on the left lateral leg including a major wound that communicates both posteriorly and superiorly w. When drainage coming out of 2 smaller areas. He has a smaller wound on the posterior medial left leg. He has more predominantly venous insufficiency wounds on predominantly the  right medial leg above the ankle the right foot into sections. He has recently been put on Augmentin at the nursing home. Previous ABIs/arterial evaluation showed triphasic waves diffusely he does not have a major ischemic issue a left lower extremity venous duplex also exam showed no evidence of a DVT on 6/21 07/21/15; the patient arrives with really no major change. Culture I did last time was negative. He has not had a follow-up MRI I ordered. The wounds are macerated covered with a thick gelatinous surface slough. There is necrotic subcutaneous tissue 08/04/15; the patient arrives with a considerable improvement in the majority of his wound area. The area on the left leg now has what looks to be a granulated base. Most of the wounds on the left leg required an aggressive surgical debridement to remove nonviable fibrinous eschar and subcutaneous tissue however after debridement most of this looks better. Although I had asked for repeat MRI of the left leg when he first came in here with grossly purulent material coming out of his wounds, it does not appear that this is been done and nor do I actually feel that strongly about it right now. He has severe surrounding venous insufficiency and inflammation. I don't believe he has significant PAD 08/25/15. In general there is still a considerable wound area here but with still extensive service adherent slough. The patient will not allow mechanical debridement due to pain. He did not tolerate Medihoney therefore we are left with Santyl for now. She would appear that he has severe surrounding venous stasis. There is no evidence of the infection that may have had something to do with the pathogenesis of these wounds 09/29/15; patient still has substantial wound area on the left leg with a cluster of several wounds on the lateral leg confluently on the left leg posteriorly and then a substantial wound on the medial leg. On the right leg  a substantial wound  medially and a small area on the right lateral foot. All of these underwent a substantial surgical debridement with curettes which she tolerated better than he has in the past. This started as a complex cellulitis in the face of chronic venous insufficiency and inflammation. 10/13/15; substantial cluster of wounds on the lateral aspect of his left leg confluently around to the other side. Extensive surgical debridement to remove for redness surface slough nonviable subcutaneous tissue. This is not an improvement. Also substantial wound on the medial right leg which is largely unchanged. The etiology of this was felt to be a complex cellulitis in the summer of 2016 in the face of chronic venous insufficiency and inflammation. The patient states that the wound on the right medial leg has been there for years off and on. 10/20/15; we are able to start [facility] to these extensive wound areas left greater than right on Tuesday after I spoke to the wound care nurse at the facility. He arrives here today for extensive surgical debridement for the wounds on the lateral aspect of his left leg posterior left leg, this is almost circumferential. He has a large wound on the medial aspect of his right leg although he finds this too painful for debridement 10/27/15; I continue to bring this patient back for frequent debridement/weekly debridement severe. We have been using Hydrofera Blue. Unfortunately this has not really had any improvement. The debridement surgery difficult and painful for the patient 11/23/15; this patient spent a complex hospitalization admitted with acute kidney injury, anemia, cellulitis of the lower extremity, he was felt to have sepsis pathophysiology although his blood cultures were negative. He had plain x-rays of both legs that were negative for osseous abnormalities. He was seen by infectious disease and placed on a workup for vasculitis that was negative including a biopsy 4 all were  consistent with stasis dermatitis. Vital broad- spectrum antibiotics he continued to spike fevers. Urine and chest x-ray were negative Dopplers on admission ruled out a DVT All antibiotics were stopped on . 2/17. Since his return to [facility] skilled nursing facility I believe they have been using Xeroform 12/07/15 the patient arrives today with the area on his right medial leg actually looking quite stable. No debridement. The rest of his extensive wounds on the left lateral left posterior extending into the left medial leg all required extensive debridement. I think this is probably going to need to and up in the hands of plastic surgery. We'll attempt to change him back to [facility]. Arrange consultation with plastic surgery at [facility] for this almost circumferential wound on the left side 12/21/15 right leg covered in surface slough. This was debridement. Left leg extensive wounds all carefully examined. This is almost circumferential on the left side especially on the posterior calf. No debridement is necessary. 01/04/16 the patient has been to [facility] plastic surgery. Unfortunately it doesn't look like they had any of the prior workup on this patient. They're in the middle of vascular workup. It sounds as though they're applying Hydrofera Blue at the nursing home. 01/22/16; the patient has been back to see [facility]. There apparently making plans to possibly do skin grafts over his large venous insufficiency ulcerations. The patient tells me that he goes to hematology in [facility] and variably uses the term platelets red cells and the description of his problem. 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 I do not see any  major difference in the wound on the right medial leg and circumferentially across the left medial to left lateral leg. Did not attempt to debride these today. Readmission: 05/05/18 on evaluation today patient presents for  reevaluation here in our clinic although I have previously seen him in the skilled nursing facility over the past year and a half when I was working in the skilled nursing facility realm instead of covering in the clinic. With that being said during that time we had initiated most recently dressings with Hydrofera Blue Dressing along with the [employer] which had done very well for him. Much better than the Kerlex Coban wraps. With that being said the patient had been doing fairly well and was making progress when I last saw him. Upon evaluation today it appears that the wounds are actually a little bit worse than when I last saw him although definitely not dramatically so. There does not appear to be any evidence of infection which is good news. With that being said he has been tolerating the wraps without complication his left hip still gives him a lot of trouble which is his main issue as far as movement is concerned. Unbeknownst to me he has not had venous studies that I can find. He previously told me he had there were arterial studies noted back from 04/27/15 which showed that he had try phasic blood flow throughout and again his test appear to be completely normal as performed by Dr. Creig Hines. With that being said I cannot find where he had venous studies. The patient still states he thinks he did these definitely had no venous intervention at this point. Upon evaluation today it does not appear to me that the patient has any evidence of cellulitis. He does have some chronic venous insufficiency which I think has led to stasis dermatitis but again this is not appearing to be infected at this point. No fevers, chills, nausea, or vomiting noted at this time. READMISSION 06/30/2018 This is a now 66 year old man we have had in this clinic at multiple times in the past. Most recently he came in August and was seen by [facility]. It is not clear why he did not come back we asked him really did not  get a straight answer. He is at [facility] skilled facility he is a man who has severe chronic venous insufficiency with lymphedema and has had severe wounds on his left greater than right leg. We had him here in 2016 and 17 ultimately referred him to [facility]. He had arterial studies and venous studies done at [facility] although I am not able to access these records I see they are actually done. He also had multiple biopsies that were negative for malignancy showing changes compatible with chronic inflammation. As I understand things in [place] they are using Iodoflex and Unna boots. I am not really sure how they are getting enough Iodoflex for the large wound area especially on his left calf. Nevertheless the wounds look better than when I saw this in 2017. There were plans for him to have plastic surgery in 2018 and I think he was actually prepped for surgery however he was ultimately denied because the patient was an active smoker. The patient is not systemically unwell. The wounds are painful however given the size especially on the left this is not surprising. ABIs in this clinic were 0.78 on the left and 0.91 on the right 07/13/2018; this is a patient with bilateral  severe venous ulcers with secondary lymphedema. The wounds are on the right medial calf and a substantial part of the left posterior calf and some involvement medially and laterally on the left. We changed him to silver alginate under Unna boot's last time. The wounds actually look fairly satisfactory today. 08/14/2018; this is a patient with severe bilateral venous stasis ulcers with secondary lymphedema left greater than right he is at [facility] using silver alginate under Unna boot's I do not think there is much change on this visit versus last time I saw him a month ago Readmission: 11/04/18 patient presents today for follow-up concerning his bilateral lower extremity ulcers. His a right medial ankle ulcer and a left  leg that has ulcers over a large proportion of the surface area between the ankle and knee. Unfortunately this does cause some discomfort for the patient although it doesn't seem to be as uncomfortable as it has been in the past. He is seen today for evaluation after a referral by [place] back to the clinic. Unfortunately has a lot of Slough noted on the surface of his wounds. He's been treated currently it sounds like with Dakin's soaked gauze and they have not been performing the Unna Boot wrap that was previously recommended. I'm not exactly sure what the reason for the change was. He does have less slough on the surface of the wound but again this is something that is often a constant issue for him. Again he's had these wounds for many years. I've known him for two years at least during that time when I was taking care of them in the facility he is Artie had the wounds for several years prior. READMISSION 02/25/2020 [name] is a now 66 year old man. He has been in our clinic several times before most extensively in October 2016 through May 2017. At that time he had absolutely substantial bilateral lower extremity wounds secondary to chronic venous insufficiency and lymphedema. We ultimately referred him to [facility] he had a series of biopsies only showed suggestion of wound secondary to chronic venous insufficiency. He had a less extensive stay in our clinic in 2019 and then a single visit in February 2020. He is a resident at [facility] skilled facility. I think he has had intermittently had in facility wound care. He returns today. They are using silver alginate on the wounds although I am not sure what type of compression. Previously he has favored Unna boots. He is not felt to have an arterial issue. Past medical history; lymphedema and chronic venous insufficiency, diabetes mellitus, hypertension, congestive heart failure We did not do arterial studies today 04/27/2020 on evaluation patient  appears to be doing really about the same as when I have known him and seen him in the past. He does have wounds on the right and left legs at this point. Fortunately there is no signs of active infection at this time. 9/2; 1 month follow-up. This is a man with severe chronic venous insufficiency and lymphedema. When I first met him he had horrible circumferential bilateral lower extremity ulcers. We have been using silver alginate up until early last month when it was changed to [facility] and Unna boot's more or less says maintenance dressings. Since the last time he was here the facility where he resides [facility] called to report they could no longer do Unna boot so he is apparently only been receiving kerlix Coban the left leg is certainly a lot worse with almost circumferential large  wounds especially lateral but now medial and posterior as well. He has a standard same looking wound on the right medial lower leg. 10/1; absolutely no improvement here. In fact I do not think anything much is changed. The substantial area on his left lateral and left posterior calf is still open may be slightly larger. He has a more modest wound on the right medial calf. None of these look any different. He comes in with a kerlix Coban wrap this will simply not be adequate. He has too much edema in this leg He also is complaining a lot of pain in his left heel area. 11/8; potential wounds on the left lateral and posterior calf and a smaller oval wound in the right medial. We have been using Hydrofera Blue under 4-layer compression. He was using Unna boots still 2 months ago the facility called to say they can no longer place these so we have been using 4-layer compression. The surface area of the area on the left is so large there is very few alternatives to what we can use on this either silver alginate or Hydrofera Blue. He was complaining of pain on the left heel last time I asked for an x-ray this was  apparently done although we do not have the result. He is not complaining of pain today. 08/16/2020 on evaluation today patient is is seen for a early visit due to the fact that he apparently is having issues I was told when they called on Monday with a MRSA infection that they felt like we needed to see him for because his legs "looked horrible". With that being said based on what I see at this point it does not appear that the patient is actually having a terrible infection in fact compared to last time I saw him his legs do not appear to be doing too poorly at all at this time. Nonetheless he has been on doxycycline that is not going to be a good option for him based on the culture report that I have for review today which graded as a partial report with still several organisms pending as far as identification is concerned we did contact them but again they do not have the final report yet. Either way based on what we see it appears that doxycycline is one of the few medications that will not work for the Citrobacter. Obviously I think that a different medication would be a good option for him since there is a gram-negative organism pending I am likely can I suggest Cipro as the best of backup antibiotic to switch him to at this point. All this was discussed with the patient today. 12/20; not much change in the substantial wound on the left posterior calf and the small area on the right medial. He has a new area on the left anterior which looks like a wrap injury. Had some thoughts about doing a deep tissue culture here although we will put that off for next time. Using silver alginate as a primary dressing 2/10; substantial area on the left posterior calf. This measures smaller but still is a very large area. He has the area on the right anterior medial which also is a fairly large wound but much smaller than not on the left. His edema control is quite good. We have been using silver alginate  because of the size of the wounds. 4/7; substantial wound on the left posterior calf and a smaller area on the right medial lower leg.  These are very chronic wounds which we have classified is venous. Previously worked up at [facility] for 5 years ago at which time I had sent and will send him over for consideration of extensive skin graft I know they biopsied this many times but he never had plastic surgery. The wound area is much too large for standard size dressings we have been using silver alginate but we did give him a good trial last fall of Hydrofera Blue that did not make any difference either. If anything the area on the right medial lower leg over time has gotten a lot smaller as has the area on the left although it still quite substantial now. Readmission 10/08/2021 Mr. [name] is a 66 year old male with a past medical history of insulin-dependent type 2 diabetes with last hemoglobin A1c of 10.2, chronic venous insufficiency, lymphedema and chronic diastolic heart failure that presents to the clinic for bilateral lower extremity wounds. He has been followed in our clinic previously for the past 6 years for these issues. He was last seen in April 2022. He is inconsistent with his appointments in our clinic. These wounds have not healed over the past few years. He is currently using silver alginate to the wound beds and keeping these covered with Kerlix. He currently denies systemic signs of infection. He was recently hospitalized for septic shock secondary to bacteremia from likely chronic leg wounds And is currently on oral antibiotics to be completed this week. 1/30; patient presents for follow-up. He has no issues or complaints today. 2/6; patient presents for follow-up. He reports more discomfort with the increase in compression wraps. He currently denies systemic signs of infection and feels well overall. Electronic Signature(s) Signed: 10/29/2021 9:58:24 AM By: [name]  DO Entered By: [name] on 10/29/2021 09:50:55 -------------------------------------------------------------------------------- Physical Exam Details Patient Name: Date of Service: [name] 10/29/2021 8:45 A M Medical Record Number: [id number] Patient Account Number: [id number] Date of Birth/Sex: Treating RN: November 27, 1955 (66 y.o. [name] Primary Care Provider: [name] Other Clinician: Referring Provider: Treating Provider/Extender: [name] in Treatment: 3 Constitutional respirations regular, non-labored and within target range for patient.Marland Kitchen Psychiatric pleasant and cooperative. Notes Right lower extremity: Open wound with granulation tissue throughout to the distal aspect. Left lower extremity: 2 large wounds with mostly granulation tissue throughout and coagulated blood noted. No signs of surrounding infection. Lymphedema skin changes bilaterally. Faint pedal pulses. Electronic Signature(s) Signed: 10/29/2021 9:58:24 AM By: [name] DO Entered By: [name] on 10/29/2021 09:52:04 -------------------------------------------------------------------------------- Physician Orders Details Patient Name: Date of Service: [name] 10/29/2021 8:45 A M Medical Record Number: [id number] Patient Account Number: [id number] Date of Birth/Sex: Treating RN: 1956/06/20 (66 y.o. [name] Primary Care Provider: [name] Other Clinician: Referring Provider: Treating Provider/Extender: [name] in Treatment: 3 Verbal / Phone Orders: No Diagnosis Coding ICD-10 Coding Code Description [phone] Chronic venous hypertension (idiopathic) with ulcer and inflammation of bilateral lower extremity 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ed L97.912 Non-pressure chronic ulcer of unspecified part of right lower leg with fat layer exposed I89.0  Lymphedema, not elsewhere classified Follow-up Appointments ppointment in 1 week. - *****Extra Time**** 75 minutes - ***HOYER*** Return A Dr Heber [place] Bathing/ Shower/ Hygiene May shower and wash wound with soap and water. - when changing dressing Edema Control - Lymphedema / SCD / Other Elevate legs to the level of the heart or  above for 30 minutes daily and/or when sitting, a frequency of: - throughout the day Moisturize legs daily. Additional Orders / Instructions Follow Nutritious Diet - Monitor/Control Blood Sugars-Continue using ProStat Other: - We have sent referral to [facility] to obtain formal ABI/TBI before using any compression. Wound Treatment Wound #25 - Lower Leg Wound Laterality: Right, Medial Cleanser: Anasept Antimicrobial Skin and Wound Cleanser, 8 (oz) 3 x Per Week/30 Days Discharge Instructions: Cleanse the wound with Anasept cleanser prior to applying a clean dressing using gauze sponges, not tissue or cotton balls. Peri-Wound Care: Zinc Oxide Ointment 30g tube 3 x Per Week/30 Days Discharge Instructions: Apply Zinc Oxide to periwound with each dressing change Peri-Wound Care: Sween Lotion (Moisturizing lotion) 3 x Per Week/30 Days Discharge Instructions: Apply moisturizing lotion as directed Topical: Gentamicin 3 x Per Week/30 Days Discharge Instructions: Apply directly to wound bed, under silver alginate Prim Dressing: KerraCel Ag Gelling Fiber Dressing, 4x5 in (silver alginate) 3 x Per Week/30 Days ary Discharge Instructions: Apply silver alginate to wound bed as instructed Secondary Dressing: ABD Pad, 8x10 3 x Per Week/30 Days Discharge Instructions: Apply over primary dressing as directed. Compression Wrap: Kerlix Roll 4.5x3.1 (in/yd) 3 x Per Week/30 Days Discharge Instructions: Apply Kerlix and Coban compression as directed. Compression Wrap: Coban Self-Adherent Wrap 4x5 (in/yd) 3 x Per Week/30 Days Discharge Instructions: Apply over Kerlix  as directed. Wound #26 - Lower Leg Wound Laterality: Left, Posterior, Proximal Cleanser: Anasept Antimicrobial Skin and Wound Cleanser, 8 (oz) 3 x Per Week/30 Days Discharge Instructions: Cleanse the wound with Anasept cleanser prior to applying a clean dressing using gauze sponges, not tissue or cotton balls. Peri-Wound Care: Zinc Oxide Ointment 30g tube 3 x Per Week/30 Days Discharge Instructions: Apply Zinc Oxide to periwound with each dressing change Peri-Wound Care: Sween Lotion (Moisturizing lotion) 3 x Per Week/30 Days Discharge Instructions: Apply moisturizing lotion as directed Topical: Gentamicin 3 x Per Week/30 Days Discharge Instructions: Apply directly to wound bed, under silver alginate Prim Dressing: KerraCel Ag Gelling Fiber Dressing, 4x5 in (silver alginate) 3 x Per Week/30 Days ary Discharge Instructions: Apply silver alginate to wound bed as instructed Secondary Dressing: ABD Pad, 8x10 3 x Per Week/30 Days Discharge Instructions: Apply over primary dressing as directed. Compression Wrap: Kerlix Roll 4.5x3.1 (in/yd) 3 x Per Week/30 Days Discharge Instructions: Apply Kerlix and Coban compression as directed. Compression Wrap: Coban Self-Adherent Wrap 4x5 (in/yd) 3 x Per Week/30 Days Discharge Instructions: Apply over Kerlix as directed. Wound #27 - Lower Leg Wound Laterality: Left, Posterior Cleanser: Anasept Antimicrobial Skin and Wound Cleanser, 8 (oz) 3 x Per Week/30 Days Discharge Instructions: Cleanse the wound with Anasept cleanser prior to applying a clean dressing using gauze sponges, not tissue or cotton balls. Peri-Wound Care: Zinc Oxide Ointment 30g tube 3 x Per Week/30 Days Discharge Instructions: Apply Zinc Oxide to periwound with each dressing change Peri-Wound Care: Sween Lotion (Moisturizing lotion) 3 x Per Week/30 Days Discharge Instructions: Apply moisturizing lotion as directed Topical: Gentamicin 3 x Per Week/30 Days Discharge Instructions: Apply  directly to wound bed, under silver alginate Prim Dressing: KerraCel Ag Gelling Fiber Dressing, 4x5 in (silver alginate) 3 x Per Week/30 Days ary Discharge Instructions: Apply silver alginate to wound bed as instructed Secondary Dressing: ABD Pad, 8x10 3 x Per Week/30 Days Discharge Instructions: Apply over primary dressing as directed. Compression Wrap: Kerlix Roll 4.5x3.1 (in/yd) 3 x Per Week/30 Days Discharge Instructions: Apply Kerlix and Coban compression as directed. Compression Wrap: Coban  Self-Adherent Wrap 4x5 (in/yd) 3 x Per Week/30 Days Discharge Instructions: Apply over Kerlix as directed. Laboratory naerobe culture (MICRO) - Right medial lower leg - (ICD10 T34.287 - Non-pressure chronic ulcer Bacteria identified in Unspecified specimen by A of unspecified part of right lower leg with fat layer exposed) LOINC Code: 681-1 Convenience Name: Anerobic culture Electronic Signature(s) Signed: 10/29/2021 9:58:24 AM By: [name] DO Entered By: [name] on 10/29/2021 09:52:21 -------------------------------------------------------------------------------- Problem List Details Patient Name: Date of Service: [name] 10/29/2021 8:45 A M Medical Record Number: [id number] Patient Account Number: [id number] Date of Birth/Sex: Treating RN: [birth year] (66 y.o. [name] Primary Care Provider: [name] Other Clinician: Referring Provider: Treating Provider/Extender: [name] in Treatment: 3 Active Problems ICD-10 Encounter Code Description Active Date MDM Diagnosis I87.333 Chronic venous hypertension (idiopathic) with ulcer and inflammation of 10/08/2021 No Yes bilateral lower extremity 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ed1/16/2023 No Yes L97.912 Non-pressure chronic ulcer of unspecified part of right lower leg with fat layer 10/08/2021 No Yes exposed I89.0 Lymphedema,  not elsewhere classified 10/08/2021 No Yes Inactive Problems Resolved Problems Electronic Signature(s) Signed: 10/29/2021 9:58:24 AM By: [name] DO Entered By: [name] on 10/29/2021 09:50:05 -------------------------------------------------------------------------------- Progress Note Details Patient Name: Date of Service: [name] 10/29/2021 8:45 A M Medical Record Number: [id number] Patient Account Number: [id number] Date of Birth/Sex: Treating RN: 1955/11/29 (66 y.o. [name] Primary Care Provider: [name] Other Clinician: Referring Provider: Treating Provider/Extender: [name] in Treatment: 3 Subjective Chief Complaint Information obtained from Patient 10/08/2021; bilateral lower extremity wounds History of Present Illness (HPI) 07/07/15; this is a patient who is in hospital on 8/2 through 8/4. He had cellulitis and abscess of predominantly I think the left leg. He received IV antibiotics. Plain x-ray showed no osteomyelitis. An MRI of the left leg did not show osteomyelitis. Cultures showed no predominant organism. His hemoglobin A1c was 9.8. He has a history of venous stasis also peripheral vascular disease. He was discharged to [place] home. He really has extensive ulcerations on the left lateral leg including a major wound that communicates both posteriorly and superiorly w. When drainage coming out of 2 smaller areas. He has a smaller wound on the posterior medial left leg. He has more predominantly venous insufficiency wounds on predominantly the right medial leg above the ankle the right foot into sections. He has recently been put on Augmentin at the nursing home. Previous ABIs/arterial evaluation showed triphasic waves diffusely he does not have a major ischemic issue a left lower extremity venous duplex also exam showed no evidence of a DVT on 6/21 07/21/15; the patient arrives with really no major  change. Culture I did last time was negative. He has not had a follow-up MRI I ordered. The wounds are macerated covered with a thick gelatinous surface slough. There is necrotic subcutaneous tissue 08/04/15; the patient arrives with a considerable improvement in the majority of his wound area. The area on the left leg now has what looks to be a granulated base. Most of the wounds on the left leg required an aggressive surgical debridement to remove nonviable fibrinous eschar and subcutaneous tissue however after debridement most of this looks better. Although I had asked for repeat MRI of the left leg when he first came in here with grossly purulent material coming out of his wounds, it does not appear that  this is been done and nor do I actually feel that strongly about it right now. He has severe surrounding venous insufficiency and inflammation. I don't believe he has significant PAD 08/25/15. In general there is still a considerable wound area here but with still extensive service adherent slough. The patient will not allow mechanical debridement due to pain. He did not tolerate Medihoney therefore we are left with Santyl for now. She would appear that he has severe surrounding venous stasis. There is no evidence of the infection that may have had something to do with the pathogenesis of these wounds 09/29/15; patient still has substantial wound area on the left leg with a cluster of several wounds on the lateral leg confluently on the left leg posteriorly and then a substantial wound on the medial leg. On the right leg a substantial wound medially and a small area on the right lateral foot. All of these underwent a substantial surgical debridement with curettes which she tolerated better than he has in the past. This started as a complex cellulitis in the face of chronic venous insufficiency and inflammation. 10/13/15; substantial cluster of wounds on the lateral aspect of his left leg confluently  around to the other side. Extensive surgical debridement to remove for redness surface slough nonviable subcutaneous tissue. This is not an improvement. Also substantial wound on the medial right leg which is largely unchanged. The etiology of this was felt to be a complex cellulitis in the summer of 2016 in the face of chronic venous insufficiency and inflammation. The patient states that the wound on the right medial leg has been there for years off and on. 10/20/15; we are able to start [facility] to these extensive wound areas left greater than right on Tuesday after I spoke to the wound care nurse at the facility. He arrives here today for extensive surgical debridement for the wounds on the lateral aspect of his left leg posterior left leg, this is almost circumferential. He has a large wound on the medial aspect of his right leg although he finds this too painful for debridement 10/27/15; I continue to bring this patient back for frequent debridement/weekly debridement severe. We have been using Hydrofera Blue. Unfortunately this has not really had any improvement. The debridement surgery difficult and painful for the patient 11/23/15; this patient spent a complex hospitalization admitted with acute kidney injury, anemia, cellulitis of the lower extremity, he was felt to have sepsis pathophysiology although his blood cultures were negative. He had plain x-rays of both legs that were negative for osseous abnormalities. He was seen by infectious disease and placed on a workup for vasculitis that was negative including a biopsy o4 all were consistent with stasis dermatitis. Vital broad-spectrum antibiotics he continued to spike fevers. Urine and chest x-ray were negative Dopplers on admission ruled out a DVT All antibiotics were stopped on 2/17. . Since his return to [facility] skilled nursing facility I believe they have been using Xeroform 12/07/15 the patient arrives today with the area on his  right medial leg actually looking quite stable. No debridement. The rest of his extensive wounds on the left lateral left posterior extending into the left medial leg all required extensive debridement. I think this is probably going to need to and up in the hands of plastic surgery. We'll attempt to change him back to [facility]. Arrange consultation with plastic surgery at [facility] for this almost circumferential wound on the left side 12/21/15 right leg covered in surface  slough. This was debridement. Left leg extensive wounds all carefully examined. This is almost circumferential on the left side especially on the posterior calf. No debridement is necessary. 01/04/16 the patient has been to [facility] plastic surgery. Unfortunately it doesn't look like they had any of the prior workup on this patient. They're in the middle of vascular workup. It sounds as though they're applying Hydrofera Blue at the nursing home. 01/22/16; the patient has been back to see [facility]. There apparently making plans to possibly do skin grafts over his large venous insufficiency ulcerations. The patient tells me that he goes to hematology in [facility] and variably uses the term platelets red cells and the description of his problem. 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 I do not see any major difference in the wound on the right medial leg and circumferentially across the left medial to left lateral leg. Did not attempt to debride these today. Readmission: 05/05/18 on evaluation today patient presents for reevaluation here in our clinic although I have previously seen him in the skilled nursing facility over the past year and a half when I was working in the skilled nursing facility realm instead of covering in the clinic. With that being said during that time we had initiated most recently dressings with Hydrofera Blue Dressing along with the [employer] which had  done very well for him. Much better than the Kerlex Coban wraps. With that being said the patient had been doing fairly well and was making progress when I last saw him. Upon evaluation today it appears that the wounds are actually a little bit worse than when I last saw him although definitely not dramatically so. There does not appear to be any evidence of infection which is good news. With that being said he has been tolerating the wraps without complication his left hip still gives him a lot of trouble which is his main issue as far as movement is concerned. Unbeknownst to me he has not had venous studies that I can find. He previously told me he had there were arterial studies noted back from 04/27/15 which showed that he had try phasic blood flow throughout and again his test appear to be completely normal as performed by Dr. Creig Hines. With that being said I cannot find where he had venous studies. The patient still states he thinks he did these definitely had no venous intervention at this point. Upon evaluation today it does not appear to me that the patient has any evidence of cellulitis. He does have some chronic venous insufficiency which I think has led to stasis dermatitis but again this is not appearing to be infected at this point. No fevers, chills, nausea, or vomiting noted at this time. READMISSION 06/30/2018 This is a now 66 year old man we have had in this clinic at multiple times in the past. Most recently he came in August and was seen by [facility] stone. It is not clear why he did not come back we asked him really did not get a straight answer. He is at [facility] skilled facility he is a man who has severe chronic venous insufficiency with lymphedema and has had severe wounds on his left greater than right leg. We had him here in 2016 and 17 ultimately referred him to [facility]. He had arterial studies and venous studies done at [facility] although I am not able to access these  records I see they  are actually done. He also had multiple biopsies that were negative for malignancy showing changes compatible with chronic inflammation. As I understand things in [place] they are using Iodoflex and Unna boots. I am not really sure how they are getting enough Iodoflex for the large wound area especially on his left calf. Nevertheless the wounds look better than when I saw this in 2017. There were plans for him to have plastic surgery in 2018 and I think he was actually prepped for surgery however he was ultimately denied because the patient was an active smoker. The patient is not systemically unwell. The wounds are painful however given the size especially on the left this is not surprising. ABIs in this clinic were 0.78 on the left and 0.91 on the right 07/13/2018; this is a patient with bilateral severe venous ulcers with secondary lymphedema. The wounds are on the right medial calf and a substantial part of the left posterior calf and some involvement medially and laterally on the left. We changed him to silver alginate under Unna boot's last time. The wounds actually look fairly satisfactory today. 08/14/2018; this is a patient with severe bilateral venous stasis ulcers with secondary lymphedema left greater than right he is at [facility] using silver alginate under Unna boot's I do not think there is much change on this visit versus last time I saw him a month ago Readmission: 11/04/18 patient presents today for follow-up concerning his bilateral lower extremity ulcers. His a right medial ankle ulcer and a left leg that has ulcers over a large proportion of the surface area between the ankle and knee. Unfortunately this does cause some discomfort for the patient although it doesn't seem to be as uncomfortable as it has been in the past. He is seen today for evaluation after a referral by [place] back to the clinic. Unfortunately has a lot of Slough noted on the surface of  his wounds. He's been treated currently it sounds like with Dakin's soaked gauze and they have not been performing the Unna Boot wrap that was previously recommended. I'm not exactly sure what the reason for the change was. He does have less slough on the surface of the wound but again this is something that is often a constant issue for him. Again he's had these wounds for many years. I've known him for two years at least during that time when I was taking care of them in the facility he is Artie had the wounds for several years prior. READMISSION 02/25/2020 [name] is a now 66 year old man. He has been in our clinic several times before most extensively in October 2016 through May 2017. At that time he had absolutely substantial bilateral lower extremity wounds secondary to chronic venous insufficiency and lymphedema. We ultimately referred him to [facility] he had a series of biopsies only showed suggestion of wound secondary to chronic venous insufficiency. He had a less extensive stay in our clinic in 2019 and then a single visit in February 2020. He is a resident at [facility] skilled facility. I think he has had intermittently had in facility wound care. He returns today. They are using silver alginate on the wounds although I am not sure what type of compression. Previously he has favored Unna boots. He is not felt to have an arterial issue. Past medical history; lymphedema and chronic venous insufficiency, diabetes mellitus, hypertension, congestive heart failure We did not do arterial studies today 04/27/2020 on evaluation patient appears to be doing  really about the same as when I have known him and seen him in the past. He does have wounds on the right and left legs at this point. Fortunately there is no signs of active infection at this time. 9/2; 1 month follow-up. This is a man with severe chronic venous insufficiency and lymphedema. When I first met him he had horrible circumferential  bilateral lower extremity ulcers. We have been using silver alginate up until early last month when it was changed to [facility] and Unna boot's more or less says maintenance dressings. Since the last time he was here the facility where he resides [facility] called to report they could no longer do Unna boot so he is apparently only been receiving kerlix Coban the left leg is certainly a lot worse with almost circumferential large wounds especially lateral but now medial and posterior as well. He has a standard same looking wound on the right medial lower leg. 10/1; absolutely no improvement here. In fact I do not think anything much is changed. The substantial area on his left lateral and left posterior calf is still open may be slightly larger. He has a more modest wound on the right medial calf. None of these look any different. He comes in with a kerlix Coban wrap this will simply not be adequate. He has too much edema in this leg He also is complaining a lot of pain in his left heel area. 11/8; potential wounds on the left lateral and posterior calf and a smaller oval wound in the right medial. We have been using Hydrofera Blue under 4-layer compression. He was using Unna boots still 2 months ago the facility called to say they can no longer place these so we have been using 4-layer compression. The surface area of the area on the left is so large there is very few alternatives to what we can use on this either silver alginate or Hydrofera Blue. He was complaining of pain on the left heel last time I asked for an x-ray this was apparently done although we do not have the result. He is not complaining of pain today. 08/16/2020 on evaluation today patient is is seen for a early visit due to the fact that he apparently is having issues I was told when they called on Monday with a MRSA infection that they felt like we needed to see him for because his legs "looked horrible". With that being  said based on what I see at this point it does not appear that the patient is actually having a terrible infection in fact compared to last time I saw him his legs do not appear to be doing too poorly at all at this time. Nonetheless he has been on doxycycline that is not going to be a good option for him based on the culture report that I have for review today which graded as a partial report with still several organisms pending as far as identification is concerned we did contact them but again they do not have the final report yet. Either way based on what we see it appears that doxycycline is one of the few medications that will not work for the Citrobacter. Obviously I think that a different medication would be a good option for him since there is a gram-negative organism pending I am likely can I suggest Cipro as the best of backup antibiotic to switch him to at this point. All this was discussed with the patient today. 12/20;  not much change in the substantial wound on the left posterior calf and the small area on the right medial. He has a new area on the left anterior which looks like a wrap injury. Had some thoughts about doing a deep tissue culture here although we will put that off for next time. Using silver alginate as a primary dressing 2/10; substantial area on the left posterior calf. This measures smaller but still is a very large area. He has the area on the right anterior medial which also is a fairly large wound but much smaller than not on the left. His edema control is quite good. We have been using silver alginate because of the size of the wounds. 4/7; substantial wound on the left posterior calf and a smaller area on the right medial lower leg. These are very chronic wounds which we have classified is venous. Previously worked up at [facility] for 5 years ago at which time I had sent and will send him over for consideration of extensive skin graft I know they biopsied this many  times but he never had plastic surgery. The wound area is much too large for standard size dressings we have been using silver alginate but we did give him a good trial last fall of Hydrofera Blue that did not make any difference either. If anything the area on the right medial lower leg over time has gotten a lot smaller as has the area on the left although it still quite substantial now. Readmission 10/08/2021 Mr. [name] is a 65 year old male with a past medical history of insulin-dependent type 2 diabetes with last hemoglobin A1c of 10.2, chronic venous insufficiency, lymphedema and chronic diastolic heart failure that presents to the clinic for bilateral lower extremity wounds. He has been followed in our clinic previously for the past 6 years for these issues. He was last seen in April 2022. He is inconsistent with his appointments in our clinic. These wounds have not healed over the past few years. He is currently using silver alginate to the wound beds and keeping these covered with Kerlix. He currently denies systemic signs of infection. He was recently hospitalized for septic shock secondary to bacteremia from likely chronic leg wounds And is currently on oral antibiotics to be completed this week. 1/30; patient presents for follow-up. He has no issues or complaints today. 2/6; patient presents for follow-up. He reports more discomfort with the increase in compression wraps. He currently denies systemic signs of infection and feels well overall. Patient History Information obtained from Patient. Family History Diabetes - Mother,Father,Siblings, Hypertension - Mother,Father, No family history of Cancer, Heart Disease, Hereditary Spherocytosis, Kidney Disease, Lung Disease, Seizures, Stroke, Thyroid Problems, Tuberculosis. Social History Current every day smoker - 4 cig per day, Marital Status - Separated, Alcohol Use - Never, Drug Use - Prior History - cocaine, [place] quit 2005, Caffeine  Use - Moderate. Medical History Eyes Denies history of Cataracts, Glaucoma, Optic Neuritis Ear/Nose/Mouth/Throat Denies history of Chronic sinus problems/congestion, Middle ear problems Hematologic/Lymphatic Patient has history of Anemia - iron deficiency Denies history of Hemophilia, Human Immunodeficiency Virus, Lymphedema, Sickle Cell Disease Respiratory Patient has history of Sleep Apnea Denies history of Aspiration, Asthma, Chronic Obstructive Pulmonary Disease (COPD), Pneumothorax, Tuberculosis Cardiovascular Patient has history of Congestive Heart Failure, Coronary Artery Disease, Hypertension, Peripheral Venous Disease Denies history of Angina, Arrhythmia, Deep Vein Thrombosis, Hypotension, Myocardial Infarction, Peripheral Arterial Disease, Phlebitis, Vasculitis Gastrointestinal Denies history of Cirrhosis , Colitis, Crohnoos, Hepatitis A, Hepatitis B, Hepatitis  C Endocrine Patient has history of Type II Diabetes - uncontrolled Denies history of Type I Diabetes Genitourinary Denies history of End Stage Renal Disease Immunological Denies history of Lupus Erythematosus, Raynaudoos, Scleroderma Integumentary (Skin) Denies history of History of Burn Musculoskeletal Patient has history of Osteoarthritis Denies history of Gout, Rheumatoid Arthritis, Osteomyelitis Neurologic Patient has history of Neuropathy Denies history of Dementia, Quadriplegia, Paraplegia, Seizure Disorder Oncologic Denies history of Received Chemotherapy, Received Radiation Psychiatric Denies history of Anorexia/bulimia, Confinement Anxiety Hospitalization/Surgery History - inguinal hernia repair with mesh. - right shoulder rotator cuff repair. - toe nail excision. - Sepsis 09/03/21-09/10/22. 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 Vitals Time Taken: 8:52 AM, Height: 77 in, Weight: 258 lbs, BMI: 30.6, Temperature: 98.6 F, Pulse: 81 bpm, Respiratory Rate: 18 breaths/min, Blood Pressure: 118/77 mmHg. Psychiatric pleasant and cooperative. General Notes: Right lower extremity: Open wound with granulation tissue throughout to the distal aspect. Left lower extremity: 2 large wounds with mostly granulation tissue throughout and coagulated blood noted. No signs of surrounding infection. Lymphedema skin changes bilaterally. Faint pedal pulses. Integumentary (Hair, Skin) Wound #24 status is Open. Original cause of wound was Gradually Appeared. The date acquired was: 09/07/2021. The wound has been in treatment 3 weeks. The wound is located on the Right,Anterior Lower Leg. The wound measures 0cm length x 0cm width x 0cm depth; 0cm^2 area and 0cm^3 volume. There is no tunneling or undermining noted. There is a none present amount of drainage noted. The wound margin is distinct with the outline attached to the wound base. There is no granulation within the wound bed. There is no necrotic tissue within the wound bed. Wound #25 status is Open. Original cause of wound was Gradually Appeared. The date acquired was: 09/07/2021. The wound has been in treatment 3 weeks. The wound is located on the Right,Medial Lower Leg. The wound measures 5.5cm length x 3.5cm width x 0.2cm depth; 15.119cm^2 area and 3.024cm^3 volume. There is Fat Layer (Subcutaneous Tissue) exposed. There is no tunneling or undermining noted. There is a medium amount of purulent drainage noted. The wound margin is thickened. There is medium (34-66%) red, pink granulation within the wound bed. There is a medium (34-66%) amount of necrotic tissue within the wound bed including Adherent Slough. Wound #26 status is Open. Original cause of wound was Gradually Appeared. The date acquired was: 09/07/2021. The wound has been in treatment 3 weeks. The wound is located on  the Left,Proximal,Posterior Lower Leg. The wound measures 7cm length x 5cm width x 0.1cm depth; 27.489cm^2 area and 2.749cm^3 volume. There is Fat Layer (Subcutaneous Tissue) exposed. There is no tunneling or undermining noted. There is a large amount of sanguinous drainage noted. The wound margin is distinct with the outline attached to the wound base. There is large (67-100%) red granulation within the wound bed. There is a small (1-33%) amount of necrotic tissue within the wound bed including Adherent Slough. Wound #27 status is Open. Original cause of wound was Gradually Appeared. The date acquired was: 09/07/2021. The wound has been in treatment 3 weeks. The wound is located on the Left,Posterior Lower Leg. The wound measures 19.5cm length x 16.5cm width x 0.3cm depth; 252.702cm^2 area and 75.811cm^3 volume. There is Fat Layer (Subcutaneous Tissue) exposed. There is no tunneling or undermining noted. There is a large amount of sanguinous drainage noted. The wound margin is distinct with the  outline attached to the wound base. There is medium (34-66%) red granulation within the wound bed. There is a medium (34-66%) amount of necrotic tissue within the wound bed including Adherent Slough. 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 No signs of surrounding infection. Due to the chronicity of the wounds there is likely a biofilm and patient may benefit from [facility] antibiotics. A PCR culture was obtained today. For now I recommended going down on the compression to Kerlix/Coban since he is having more discomfort with the increase to 3  layer. Also recommended using gentamicin ointment to the wound beds along with Hydrofera Blue. Follow-up in 1 week. Plan Follow-up Appointments: Return Appointment in 1 week. - *****Extra Time**** 75 minutes - ***HOYER*** Dr Heber [place] Bathing/ Shower/ Hygiene: May shower and wash wound with soap and water. - when changing dressing Edema Control - Lymphedema / SCD / Other: Elevate legs to the level of the heart or above for 30 minutes daily and/or when sitting, a frequency of: - throughout the day Moisturize legs daily. Additional Orders / Instructions: Follow Nutritious Diet - Monitor/Control Blood Sugars-Continue using ProStat Other: - We have sent referral to [facility] to obtain formal ABI/TBI before using any compression. Laboratory ordered were: Anerobic culture - Right medial lower leg WOUND #25: - Lower Leg Wound Laterality: Right, Medial Cleanser: Anasept Antimicrobial Skin and Wound Cleanser, 8 (oz) 3 x Per Week/30 Days Discharge Instructions: Cleanse the wound with Anasept cleanser prior to applying a clean dressing using gauze sponges, not tissue or cotton balls. Peri-Wound Care: Zinc Oxide Ointment 30g tube 3 x Per Week/30 Days Discharge Instructions: Apply Zinc Oxide to periwound with each dressing change Peri-Wound Care: Sween Lotion (Moisturizing lotion) 3 x Per Week/30 Days Discharge Instructions: Apply moisturizing lotion as directed Topical: Gentamicin 3 x Per Week/30 Days Discharge Instructions: Apply directly to wound bed, under silver alginate Prim Dressing: KerraCel Ag Gelling Fiber Dressing, 4x5 in (silver alginate) 3 x Per Week/30 Days ary Discharge Instructions: Apply silver alginate to wound bed as instructed Secondary Dressing: ABD Pad, 8x10 3 x Per Week/30 Days Discharge Instructions: Apply over primary dressing as directed. Com pression Wrap: Kerlix Roll 4.5x3.1 (in/yd) 3 x Per Week/30 Days Discharge Instructions: Apply Kerlix and Coban  compression as directed. Com pression Wrap: Coban Self-Adherent Wrap 4x5 (in/yd) 3 x Per Week/30 Days Discharge Instructions: Apply over Kerlix as directed. WOUND #26: - Lower Leg Wound Laterality: Left, Posterior, Proximal Cleanser: Anasept Antimicrobial Skin and Wound Cleanser, 8 (oz) 3 x Per Week/30 Days Discharge Instructions: Cleanse the wound with Anasept cleanser prior to applying a clean dressing using gauze sponges, not tissue or cotton balls. Peri-Wound Care: Zinc Oxide Ointment 30g tube 3 x Per Week/30 Days Discharge Instructions: Apply Zinc Oxide to periwound with each dressing change Peri-Wound Care: Sween Lotion (Moisturizing lotion) 3 x Per Week/30 Days Discharge Instructions: Apply moisturizing lotion as directed Topical: Gentamicin 3 x Per Week/30 Days Discharge Instructions: Apply directly to wound bed, under silver alginate Prim Dressing: KerraCel Ag Gelling Fiber Dressing, 4x5 in (silver alginate) 3 x Per Week/30 Days ary Discharge Instructions: Apply silver alginate to wound bed as instructed Secondary Dressing: ABD Pad, 8x10 3 x Per  Week/30 Days Discharge Instructions: Apply over primary dressing as directed. Com pression Wrap: Kerlix Roll 4.5x3.1 (in/yd) 3 x Per Week/30 Days Discharge Instructions: Apply Kerlix and Coban compression as directed. Com pression Wrap: Coban Self-Adherent Wrap 4x5 (in/yd) 3 x Per Week/30 Days Discharge Instructions: Apply over Kerlix as directed. WOUND #27: - Lower Leg Wound Laterality: Left, Posterior Cleanser: Anasept Antimicrobial Skin and Wound Cleanser, 8 (oz) 3 x Per Week/30 Days Discharge Instructions: Cleanse the wound with Anasept cleanser prior to applying a clean dressing using gauze sponges, not tissue or cotton balls. Peri-Wound Care: Zinc Oxide Ointment 30g tube 3 x Per Week/30 Days Discharge Instructions: Apply Zinc Oxide to periwound with each dressing change Peri-Wound Care: Sween Lotion (Moisturizing lotion) 3 x Per  Week/30 Days Discharge Instructions: Apply moisturizing lotion as directed Topical: Gentamicin 3 x Per Week/30 Days Discharge Instructions: Apply directly to wound bed, under silver alginate Prim Dressing: KerraCel Ag Gelling Fiber Dressing, 4x5 in (silver alginate) 3 x Per Week/30 Days ary Discharge Instructions: Apply silver alginate to wound bed as instructed Secondary Dressing: ABD Pad, 8x10 3 x Per Week/30 Days Discharge Instructions: Apply over primary dressing as directed. Com pression Wrap: Kerlix Roll 4.5x3.1 (in/yd) 3 x Per Week/30 Days Discharge Instructions: Apply Kerlix and Coban compression as directed. Com pression Wrap: Coban Self-Adherent Wrap 4x5 (in/yd) 3 x Per Week/30 Days Discharge Instructions: Apply over Kerlix as directed. 1. Gentamicin and Hydrofera Blue under Kerlix/Coban 2. PCR culture 3. Follow-up in 1 week Electronic Signature(s) Signed: 10/29/2021 9:58:24 AM By: [name] DO Entered By: [name] on 10/29/2021 09:57:01 -------------------------------------------------------------------------------- HxROS Details Patient Name: Date of Service: [name] 10/29/2021 8:45 A M Medical Record Number: [id number] Patient Account Number: [id number] Date of Birth/Sex: Treating RN: 1956/01/17 (66 y.o. [name] Primary Care Provider: [name] Other Clinician: Referring Provider: Treating Provider/Extender: [name] in Treatment: 3 Information Obtained From Patient Eyes Medical History: Negative for: Cataracts; Glaucoma; Optic Neuritis Ear/Nose/Mouth/Throat Medical History: Negative for: Chronic sinus problems/congestion; Middle ear problems Hematologic/Lymphatic Medical History: Positive for: Anemia - iron deficiency Negative for: Hemophilia; Human Immunodeficiency Virus; Lymphedema; Sickle Cell Disease Respiratory Medical History: Positive for: Sleep Apnea Negative for: Aspiration; Asthma;  Chronic Obstructive Pulmonary Disease (COPD); Pneumothorax; Tuberculosis Cardiovascular Medical History: Positive for: Congestive Heart Failure; Coronary Artery Disease; Hypertension; Peripheral Venous Disease Negative for: Angina; Arrhythmia; Deep Vein Thrombosis; Hypotension; Myocardial Infarction; Peripheral Arterial Disease; Phlebitis; Vasculitis Gastrointestinal Medical History: Negative for: Cirrhosis ; Colitis; Crohns; Hepatitis A; Hepatitis B; Hepatitis C Past Medical History Notes: GERD Endocrine Medical History: Positive for: Type II Diabetes - uncontrolled Negative for: Type I Diabetes Time with diabetes: 1999 Treated with: Insulin Blood sugar tested every day: Yes T ested : 3x per day Blood sugar testing results: Breakfast: 81-200; Lunch: 180-210; Dinner: 200 Genitourinary Medical History: Negative for: End Stage Renal Disease Immunological Medical History: Negative for: Lupus Erythematosus; Raynauds; Scleroderma Integumentary (Skin) Medical History: Negative for: History of Burn Musculoskeletal Medical History: Positive for: Osteoarthritis Negative for: Gout; Rheumatoid Arthritis; Osteomyelitis Past Medical History Notes: degenerative righ thip Neurologic Medical History: Positive for: Neuropathy Negative for: Dementia; Quadriplegia; Paraplegia; Seizure Disorder Oncologic Medical History: Negative for: Received Chemotherapy; Received Radiation Psychiatric Medical History: Negative for: Anorexia/bulimia; Confinement Anxiety Past Medical History Notes: depression Immunizations Pneumococcal Vaccine: Received Pneumococcal Vaccination: Yes Received Pneumococcal Vaccination On or After 60th Birthday: No Implantable Devices None Hospitalization / Surgery History Type of Hospitalization/Surgery inguinal hernia repair with mesh right shoulder rotator cuff repair  toe nail excision Sepsis 09/03/21-09/10/22 Family and Social History Cancer: No; Diabetes:  Yes - Mother,Father,Siblings; Heart Disease: No; Hereditary Spherocytosis: No; Hypertension: Yes - Mother,Father; Kidney Disease: No; Lung Disease: No; Seizures: No; Stroke: No; Thyroid Problems: No; Tuberculosis: No; Current every day smoker - 4 cig per day; Marital Status - Separated; Alcohol Use: Never; Drug Use: Prior History - cocaine, [place] quit 2005; Caffeine Use: Moderate; Financial Concerns: No; Food, Clothing or Shelter Needs: No; Support System Lacking: No; Transportation Concerns: No Electronic Signature(s) Signed: 10/29/2021 9:58:24 AM By: [name] DO Signed: 10/29/2021 4:43:35 PM By: Lorrin Jackson Entered By: [name] on 10/29/2021 09:51:01 -------------------------------------------------------------------------------- SuperBill Details Patient Name: Date of Service: [name] 10/29/2021 Medical Record Number: [id number] Patient Account Number: [id number] Date of Birth/Sex: Treating RN: 03-06-1956 (66 y.o. 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 I89.0 Lymphedema, not elsewhere classified Facility Procedures CPT4 Code: [id number] Description: [facility] VISIT-LEV 5 EST PT Modifier: Quantity: 1 Physician Procedures : CPT4 Code Description Modifier [id number] - WC PHYS LEVEL 3 - EST PT ICD-10 Diagnosis Description I87.333 Chronic venous hypertension (idiopathic) with ulcer and inflammation of bilateral lower extremity L97.822 Non-pressure chronic ulcer of other  part of left lower  leg with fat layer exposed L97.912 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

## 2021-10-30 NOTE — Progress Notes (Signed)
Blum, Primo ([id number]) Visit Report for 10/29/2021 Arrival Information Details [name], [name] 10/29/2021 8:45 A M Medical Record Number: [id number] Patient Account Number: [id number] Date of Birth/Sex: Treating RN: [birth year] (66 y.o. [name] Primary Care Gwenlyn Hottinger: [name] Other Clinician: Referring Aivah Putman: Treating Kaylub Detienne/Extender: [name] in Treatment: 3 Visit Information History Since Last Visit Added or deleted any medications: No Patient Arrived: Wheel Chair Any new allergies or adverse reactions: No Arrival Time: 08:52 Had a fall or experienced change in No Accompanied By: alone activities of daily living that may affect Transfer Assistance: Harrel Lemon Lift risk of falls: Patient Identification Verified: Yes Signs or symptoms of abuse/neglect since last visito No Secondary Verification Process Completed: Yes Hospitalized since last visit: No Patient Requires Transmission-Based No Implantable device outside of the clinic excluding No Precautions: cellular tissue based products placed in the center Patient Has Alerts: Yes since last visit: Patient Alerts: Patient on Blood Thinner Has Dressing in Place as Prescribed: Yes *NO BLOOD Has Compression in Place as Prescribed: Yes PRODUCTS* Pain Present Now: Yes Electronic Signature(s) Signed: 10/29/2021 2:05:18 PM By: Levan Hurst RN, BSN Entered By: Levan Hurst on 10/29/2021 09:14:32 -------------------------------------------------------------------------------- [name], [name] 10/29/2021 8:45 A M Medical Record Number: [id number] Patient Account Number: [id number] Date of Birth/Sex: Treating RN: Jul 21, 1956 (66 y.o. [name] Primary Care Anny Sayler: [name] Other Clinician: Referring Jaleea Alesi: Treating Robinn Overholt/Extender: [name] in  Treatment: 3 Clinic Level of Care Assessment Items TOOL 4 Quantity Score X- 1 0 Use when only an EandM is performed on FOLLOW-UP visit ASSESSMENTS - Nursing Assessment / Reassessment X- 1 10 Reassessment of Co-morbidities (includes updates in patient status) X- 1 5 Reassessment of Adherence to Treatment Plan ASSESSMENTS - Wound and Skin A ssessment / Reassessment []  - 0 Simple Wound Assessment / Reassessment - one wound X- 4 5 Complex Wound Assessment / Reassessment - multiple wounds []  - 0 Dermatologic / Skin Assessment (not related to wound area) ASSESSMENTS - Focused Assessment []  - 0 Circumferential Edema Measurements - multi extremities []  - 0 Nutritional Assessment / Counseling / Intervention X- 1 5 Lower Extremity Assessment (monofilament, tuning fork, pulses) []  - 0 Peripheral Arterial Disease Assessment (using hand held doppler) ASSESSMENTS - Ostomy and/or Continence Assessment and Care []  - 0 Incontinence Assessment and Management []  - 0 Ostomy Care Assessment and Management (repouching, etc.) PROCESS - Coordination of Care X - Simple Patient / Family Education for ongoing care 1 15 []  - 0 Complex (extensive) Patient / Family Education for ongoing care X- 1 10 Staff obtains Programmer, systems, Records, T Results / Process Orders est []  - 0 Staff telephones HHA, Nursing Homes / Clarify orders / etc []  - 0 Routine Transfer to another Facility (non-emergent condition) []  - 0 Routine Hospital Admission (non-emergent condition) []  - 0 New Admissions / Biomedical engineer / Ordering NPWT Apligraf, etc. , []  - 0 Emergency Hospital Admission (emergent condition) X- 1 10 Simple Discharge Coordination []  - 0 Complex (extensive) Discharge Coordination PROCESS - Special Needs []  - 0 Pediatric / Minor Patient Management []  - 0 Isolation Patient Management []  - 0 Hearing / Language / Visual special needs []  - 0 Assessment of Community assistance (transportation,  D/C planning, etc.) []  - 0 Additional assistance / Altered mentation []  - 0 Support Surface(s) Assessment (bed, cushion, seat, etc.) INTERVENTIONS - Wound Cleansing / Measurement []  -  0 Simple Wound Cleansing - one wound X- 4 5 Complex Wound Cleansing - multiple wounds []  - 0 Wound Imaging (photographs - any number of wounds) []  - 0 Wound Tracing (instead of photographs) []  - 0 Simple Wound Measurement - one wound X- 4 5 Complex Wound Measurement - multiple wounds INTERVENTIONS - Wound Dressings []  - 0 Small Wound Dressing one or multiple wounds []  - 0 Medium Wound Dressing one or multiple wounds X- 2 20 Large Wound Dressing one or multiple wounds []  - 0 Application of Medications - topical []  - 0 Application of Medications - injection INTERVENTIONS - Miscellaneous []  - 0 External ear exam X- 1 5 Specimen Collection (cultures, biopsies, blood, body fluids, etc.) X- 1 5 Specimen(s) / Culture(s) sent or taken to Lab for analysis X- 1 10 Patient Transfer (multiple staff / Civil Service fast streamer / Similar devices) []  - 0 Simple Staple / Suture removal (25 or less) []  - 0 Complex Staple / Suture removal (26 or more) []  - 0 Hypo / Hyperglycemic Management (close monitor of Blood Glucose) []  - 0 Ankle / Brachial Index (ABI) - do not check if billed separately X- 1 5 Vital Signs Has the patient been seen at the hospital within the last three years: Yes Total Score: 180 Level Of Care: New/Established - Level 5 Electronic Signature(s) Signed: 10/29/2021 2:05:18 PM By: Levan Hurst RN, BSN Entered By: Levan Hurst on 10/29/2021 12:45:20 -------------------------------------------------------------------------------- Encounter Discharge Information Details [name], [name] 10/29/2021 8:45 A M Medical Record Number: [id number] Patient Account Number: [id number] Date of Birth/Sex: Treating RN: 09-Jul-1956 (66 y.o. [name] Primary Care Jerah Esty:  [name] Other Clinician: Referring Janiece Scovill: Treating Luka Reisch/Extender: [name] in Treatment: 3 Encounter Discharge Information Items Discharge Condition: Stable Ambulatory Status: Wheelchair Discharge Destination: Home Transportation: Private Auto Accompanied By: alone Schedule Follow-up Appointment: Yes Clinical Summary of Care: Patient Declined Electronic Signature(s) Signed: 10/29/2021 2:05:18 PM By: Levan Hurst RN, BSN Entered By: Levan Hurst on 10/29/2021 12:46:20 -------------------------------------------------------------------------------- Lower Extremity Assessment Details [name], [name] 10/29/2021 8:45 A M Medical Record Number: [id number] Patient Account Number: [id number] Date of Birth/Sex: Treating RN: May 30, 1956 (66 y.o. [name] Primary Care Ashleynicole Mcclees: [name] Other Clinician: Referring Aneeka Bowden: Treating Berlin Viereck/Extender: [name] in Treatment: 3 Edema Assessment Assessed: Shirlyn Goltz: No] Patrice Paradise: No] Edema: [Left: Yes] [Right: Yes] Calf Left: Right: Point of Measurement: 37 cm From Medial Instep 41 cm 43 cm Ankle Left: Right: Point of Measurement: 12 cm From Medial Instep 30 cm 28 cm Vascular Assessment Pulses: Dorsalis Pedis Palpable: [Left:Yes] [Right:Yes] Electronic Signature(s) Signed: 10/29/2021 2:05:18 PM By: Levan Hurst RN, BSN Entered By: Levan Hurst on 10/29/2021 09:42:15 -------------------------------------------------------------------------------- Multi Wound Chart Details [name], [name] 10/29/2021 8:45 A M Medical Record Number: [id number] Patient Account Number: [id number] Date of Birth/Sex: Treating RN: 1956-03-06 (66 y.o. [name] Primary Care Etta Gassett: [name] Other Clinician: Referring Emmitte Surgeon: Treating Kinan Safley/Extender: [name] in  Treatment: 3 Vital Signs Height(in): 77 Pulse(bpm): 53 Weight(lbs): 258 Blood Pressure(mmHg): 118/77 Body Mass Index(BMI): 30.6 Temperature(F): 98.6 Respiratory Rate(breaths/min): 18 Photos: Right, Anterior Lower Leg Right, Medial Lower Leg Left, Proximal, Posterior Lower Leg Wound Location: Gradually Appeared Gradually Appeared Gradually Appeared Wounding Event: Venous Leg Ulcer Venous Leg Ulcer Venous Leg Ulcer Primary Etiology: Anemia, Sleep Apnea, Congestive Anemia, Sleep Apnea, Congestive Anemia, Sleep Apnea, Congestive Comorbid History: Heart  Failure, Coronary Artery Heart Failure, Coronary Artery Heart Failure, Coronary Artery Disease, Hypertension, Peripheral Disease, Hypertension, Peripheral Disease, Hypertension, Peripheral Venous Disease, Type II Diabetes, Venous Disease, Type II Diabetes, Venous Disease, Type II Diabetes, Osteoarthritis, Neuropathy Osteoarthritis, Neuropathy Osteoarthritis, Neuropathy 09/07/2021 09/07/2021 09/07/2021 Date Acquired: 3 3 3  Weeks of Treatment: Open Open Open Wound Status: No No No Wound Recurrence: Yes No No Clustered Wound: 4 N/A N/A Clustered Quantity: 0x0x0 5.5x3.5x0.2 7x5x0.1 Measurements L x W x D (cm) 0 15.119 27.489 A (cm) : rea 0 3.024 2.749 Volume (cm) : 100.00% -25.80% 65.40% % Reduction in Area: 100.00% 16.10% 88.50% % Reduction in Volume: Full Thickness Without Exposed Full Thickness Without Exposed Full Thickness Without Exposed Classification: Support Structures Support Structures Support Structures None Present Medium Large Exudate Amount: N/A Purulent Sanguinous Exudate Type: N/A yellow, brown, green red Exudate Color: Distinct, outline attached Thickened Distinct, outline attached Wound Margin: None Present (0%) Medium (34-66%) Large (67-100%) Granulation Amount: N/A Red, Pink Red Granulation Quality: None Present (0%) Medium (34-66%) Small (1-33%) Necrotic Amount: Fascia: No Fat Layer  (Subcutaneous Tissue): Yes Fat Layer (Subcutaneous Tissue): Yes Exposed Structures: Fat Layer (Subcutaneous Tissue): No Fascia: No Fascia: No Tendon: No Tendon: No Tendon: No Muscle: No Muscle: No Muscle: No Joint: No Joint: No Joint: No Bone: No Bone: No Bone: No Large (67-100%) Small (1-33%) Small (1-33%) Epithelialization: Wound Number: 27 N/A N/A Photos: N/A N/A Left, Posterior Lower Leg N/A N/A Wound Location: Gradually Appeared N/A N/A Wounding Event: Venous Leg Ulcer N/A N/A Primary Etiology: Anemia, Sleep Apnea, Congestive N/A N/A Comorbid History: Heart Failure, Coronary Artery Disease, Hypertension, Peripheral Venous Disease, Type II Diabetes, Osteoarthritis, Neuropathy 09/07/2021 N/A N/A Date Acquired: 3 N/A N/A Weeks of Treatment: Open N/A N/A Wound Status: No N/A N/A Wound Recurrence: No N/A N/A Clustered Wound: N/A N/A N/A Clustered Quantity: 19.5x16.5x0.3 N/A N/A Measurements L x W x D (cm) 252.702 N/A N/A A (cm) : rea 75.811 N/A N/A Volume (cm) : 2.50% N/A N/A % Reduction in Area: 2.50% N/A N/A % Reduction in Volume: Full Thickness Without Exposed N/A N/A Classification: Support Structures Large N/A N/A Exudate Amount: Sanguinous N/A N/A Exudate Type: red N/A N/A Exudate Color: Distinct, outline attached N/A N/A Wound Margin: Medium (34-66%) N/A N/A Granulation Amount: Red N/A N/A Granulation Quality: Medium (34-66%) N/A N/A Necrotic Amount: Fat Layer (Subcutaneous Tissue): Yes N/A N/A Exposed Structures: Fascia: No Tendon: No Muscle: No Joint: No Bone: No Small (1-33%) N/A N/A Epithelialization: Treatment Notes Electronic Signature(s) Signed: 10/29/2021 9:58:24 AM By: [name] DO Signed: 10/29/2021 4:43:35 PM By: Lorrin Jackson Entered By: [name] on 10/29/2021 09:50:12 -------------------------------------------------------------------------------- Multi-Disciplinary Care Plan Details Patient  Name: Date of Service: [name], [name] 10/29/2021 8:45 A M Medical Record Number: [id number] Patient Account Number: [id number] Date of Birth/Sex: Treating RN: 1956/02/22 (66 y.o. [name] Primary Care Branston Halsted: [name] Other Clinician: Referring Hosanna Betley: Treating Sabastion Hrdlicka/Extender: [name] in Treatment: 3 Multidisciplinary Care Plan reviewed with physician Active Inactive Venous Leg Ulcer Nursing Diagnoses: Actual venous Insuffiency (use after diagnosis is confirmed) Goals: Patient will maintain optimal edema control Date Initiated: 10/08/2021 Target Resolution Date: 11/05/2021 Goal Status: Active Interventions: Compression as ordered Provide education on venous insufficiency Treatment Activities: Therapeutic compression applied : 10/08/2021 Notes: Wound/Skin Impairment Nursing Diagnoses: Impaired tissue integrity Goals: Patient/caregiver will verbalize understanding of skin care regimen Date Initiated: 10/08/2021 Target Resolution Date: 11/05/2021 Goal Status: Active Ulcer/skin breakdown will have a volume reduction of 30% by week  4 Date Initiated: 10/08/2021 Target Resolution Date: 11/05/2021 Goal Status: Active Interventions: Assess patient/caregiver ability to perform ulcer/skin care regimen upon admission and as needed Assess ulceration(s) every visit Provide education on ulcer and skin care Treatment Activities: Topical wound management initiated : 10/08/2021 Notes: Electronic Signature(s) Signed: 10/29/2021 2:05:18 PM By: Levan Hurst RN, BSN Entered By: Levan Hurst on 10/29/2021 09:25:29 -------------------------------------------------------------------------------- Pain Assessment Details [name], [name] 10/29/2021 8:45 A M Medical Record Number: [id number] Patient Account Number: [id number] Date of Birth/Sex: Treating RN: Jun 08, 1956 (65 y.o. [name] Primary Care Ireta Pullman:  [name] Other Clinician: Referring Artesha Wemhoff: Treating Lavonia Eager/Extender: [name] in Treatment: 3 Active Problems Location of Pain Severity and Description of Pain Patient Has Paino Yes Site Locations Pain Location: Pain Location: Generalized Pain With Dressing Change: Yes Duration of the Pain. Constant / Intermittento Intermittent Rate the pain. Current Pain Level: 8 Character of Pain Describe the Pain: Throbbing Pain Management and Medication Current Pain Management: Medication: Yes Cold Application: No Rest: No Massage: No Activity: No T.E.N.S.: No Heat Application: No Leg drop or elevation: No Is the Current Pain Management Adequate: Adequate How does your wound impact your activities of daily livingo Sleep: No Bathing: No Appetite: No Relationship With Others: No Bladder Continence: No Emotions: No Bowel Continence: No Work: No Toileting: No Drive: No Dressing: No Hobbies: No Electronic Signature(s) Signed: 10/29/2021 2:05:18 PM By: Levan Hurst RN, BSN Entered By: Levan Hurst on 10/29/2021 09:15:33 -------------------------------------------------------------------------------- Patient/Caregiver Education Details Patient Name: Date of Service: Orma Flaming RD, [name] 2/6/2023andnbsp8:45 A M Medical Record Number: [id number] Patient Account Number: [id number] Date of Birth/Gender: Treating RN: [birth year] (66 y.o. [name] Primary Care Physician: [name] Other Clinician: Referring Physician: Treating Physician/Extender: [name] in Treatment: 3 Education Assessment Education Provided To: Patient Education Topics Provided Wound/Skin Impairment: Methods: Explain/Verbal Responses: State content correctly Electronic Signature(s) Signed: 10/29/2021 2:05:18 PM By: Levan Hurst RN, BSN Entered By: Levan Hurst on 10/29/2021  09:25:46 -------------------------------------------------------------------------------- Wound Assessment Details [name], [name] 10/29/2021 8:45 A M Medical Record Number: [id number] Patient Account Number: [id number] Date of Birth/Sex: Treating RN: 07/07/1956 (66 y.o. [name] Primary Care Clemmie Buelna: [name] Other Clinician: Referring Woods Gangemi: Treating Alanta Scobey/Extender: [name] in Treatment: 3 Wound Status Wound Number: 24 Primary Venous Leg Ulcer Etiology: Wound Location: Right, Anterior Lower Leg Wound Open Wounding Event: Gradually Appeared Status: Date Acquired: 09/07/2021 Comorbid Anemia, Sleep Apnea, Congestive Heart Failure, Coronary Artery Weeks Of Treatment: 3 History: Disease, Hypertension, Peripheral Venous Disease, Type II Clustered Wound: Yes Diabetes, Osteoarthritis, Neuropathy Photos Wound Measurements Length: (cm) Width: (cm) Depth: (cm) Clustered Quantity: Area: (cm) Volume: (cm) 0 % Reduction in Area: 100% 0 % Reduction in Volume: 100% 0 Epithelialization: Large (67-100%) 4 Tunneling: No 0 Undermining: No 0 Wound Description Classification: Full Thickness Without Exposed Support Structures Wound Margin: Distinct, outline attached Exudate Amount: None Present Foul Odor After Cleansing: No Slough/Fibrino No Wound Bed Granulation Amount: None Present (0%) Exposed Structure Necrotic Amount: None Present (0%) Fascia Exposed: No Fat Layer (Subcutaneous Tissue) Exposed: No Tendon Exposed: No Muscle Exposed: No Joint Exposed: No Bone Exposed: No Electronic Signature(s) Signed: 10/29/2021 2:05:18 PM By: Levan Hurst RN, BSN Signed: 10/30/2021 8:57:49 AM By: Sandre Kitty Entered By: Sandre Kitty on 10/29/2021 09:21:55 -------------------------------------------------------------------------------- Wound Assessment Details [name], A  UDIE 10/29/2021 8:45 A M Medical Record Number:  [id number] Patient Account Number: [id number] Date of Birth/Sex: Treating RN: Aug 31, 1956 (66 y.o. [name] Primary Care Cohan Stipes: [name] Other Clinician: Referring Marlena Barbato: Treating Erla Bacchi/Extender: [name] in Treatment: 3 Wound Status Wound Number: 25 Primary Venous Leg Ulcer Etiology: Wound Location: Right, Medial Lower Leg Wound Open Wounding Event: Gradually Appeared Status: Date Acquired: 09/07/2021 Comorbid Anemia, Sleep Apnea, Congestive Heart Failure, Coronary Artery Weeks Of Treatment: 3 History: Disease, Hypertension, Peripheral Venous Disease, Type II Clustered Wound: No Diabetes, Osteoarthritis, Neuropathy Photos Wound Measurements Length: (cm) 5.5 Width: (cm) 3.5 Depth: (cm) 0.2 Area: (cm) 15.119 Volume: (cm) 3.024 % Reduction in Area: -25.8% % Reduction in Volume: 16.1% Epithelialization: Small (1-33%) Tunneling: No Undermining: No Wound Description Classification: Full Thickness Without Exposed Support Structures Wound Margin: Thickened Exudate Amount: Medium Exudate Type: Purulent Exudate Color: yellow, brown, green Foul Odor After Cleansing: No Slough/Fibrino Yes Wound Bed Granulation Amount: Medium (34-66%) Exposed Structure Granulation Quality: Red, Pink Fascia Exposed: No Necrotic Amount: Medium (34-66%) Fat Layer (Subcutaneous Tissue) Exposed: Yes Necrotic Quality: Adherent Slough Tendon Exposed: No Muscle Exposed: No Joint Exposed: No Bone Exposed: No Treatment Notes Wound #25 (Lower Leg) Wound Laterality: Right, Medial Cleanser Anasept Antimicrobial Skin and Wound Cleanser, 8 (oz) Discharge Instruction: Cleanse the wound with Anasept cleanser prior to applying a clean dressing using gauze sponges, not tissue or cotton balls. Peri-Wound Care Zinc Oxide Ointment 30g tube Discharge Instruction: Apply Zinc Oxide to periwound with each  dressing change Sween Lotion (Moisturizing lotion) Discharge Instruction: Apply moisturizing lotion as directed Topical Gentamicin Discharge Instruction: Apply directly to wound bed, under silver alginate Primary Dressing KerraCel Ag Gelling Fiber Dressing, 4x5 in (silver alginate) Discharge Instruction: Apply silver alginate to wound bed as instructed Secondary Dressing ABD Pad, 8x10 Discharge Instruction: Apply over primary dressing as directed. Secured With Compression Wrap Kerlix Roll 4.5x3.1 (in/yd) Discharge Instruction: Apply Kerlix and Coban compression as directed. Coban Self-Adherent Wrap 4x5 (in/yd) Discharge Instruction: Apply over Kerlix as directed. Compression Stockings Add-Ons Electronic Signature(s) Signed: 10/29/2021 2:05:18 PM By: Levan Hurst RN, BSN Signed: 10/30/2021 8:57:49 AM By: Sandre Kitty Entered By: Sandre Kitty on 10/29/2021 09:22:26 -------------------------------------------------------------------------------- Wound Assessment Details [name], [name] 10/29/2021 8:45 A M Medical Record Number: [id number] Patient Account Number: [id number] Date of Birth/Sex: Treating RN: 1956/09/13 (66 y.o. [name] Primary Care Jazir Newey: [name] Other Clinician: Referring Corby Vandenberghe: Treating Hawk Mones/Extender: [name] in Treatment: 3 Wound Status Wound Number: 26 Primary Venous Leg Ulcer Etiology: Wound Location: Left, Proximal, Posterior Lower Leg Wound Open Wounding Event: Gradually Appeared Status: Date Acquired: 09/07/2021 Comorbid Anemia, Sleep Apnea, Congestive Heart Failure, Coronary Artery Weeks Of Treatment: 3 History: Disease, Hypertension, Peripheral Venous Disease, Type II Clustered Wound: No Diabetes, Osteoarthritis, Neuropathy Photos Wound Measurements Length: (cm) 7 Width: (cm) 5 Depth: (cm) 0.1 Area: (cm) 27.489 Volume: (cm) 2.749 % Reduction in Area:  65.4% % Reduction in Volume: 88.5% Epithelialization: Small (1-33%) Tunneling: No Undermining: No Wound Description Classification: Full Thickness Without Exposed Support Structures Wound Margin: Distinct, outline attached Exudate Amount: Large Exudate Type: Sanguinous Exudate Color: red Foul Odor After Cleansing: No Slough/Fibrino Yes Wound Bed Granulation Amount: Large (67-100%) Exposed Structure Granulation Quality: Red Fascia Exposed: No Necrotic Amount: Small (1-33%) Fat Layer (Subcutaneous Tissue) Exposed: Yes Necrotic Quality: Adherent Slough Tendon Exposed: No Muscle Exposed: No Joint Exposed: No Bone Exposed: No Treatment Notes Wound #26 (Lower Leg) Wound Laterality: Left, Posterior, Proximal Cleanser Anasept Antimicrobial Skin and Wound  Cleanser, 8 (oz) Discharge Instruction: Cleanse the wound with Anasept cleanser prior to applying a clean dressing using gauze sponges, not tissue or cotton balls. Peri-Wound Care Zinc Oxide Ointment 30g tube Discharge Instruction: Apply Zinc Oxide to periwound with each dressing change Sween Lotion (Moisturizing lotion) Discharge Instruction: Apply moisturizing lotion as directed Topical Gentamicin Discharge Instruction: Apply directly to wound bed, under silver alginate Primary Dressing KerraCel Ag Gelling Fiber Dressing, 4x5 in (silver alginate) Discharge Instruction: Apply silver alginate to wound bed as instructed Secondary Dressing ABD Pad, 8x10 Discharge Instruction: Apply over primary dressing as directed. Secured With Compression Wrap Kerlix Roll 4.5x3.1 (in/yd) Discharge Instruction: Apply Kerlix and Coban compression as directed. Coban Self-Adherent Wrap 4x5 (in/yd) Discharge Instruction: Apply over Kerlix as directed. Compression Stockings Add-Ons Electronic Signature(s) Signed: 10/29/2021 2:05:18 PM By: Levan Hurst RN, BSN Signed: 10/30/2021 8:57:49 AM By: Sandre Kitty Entered By: Sandre Kitty on  10/29/2021 09:23:43 -------------------------------------------------------------------------------- Wound Assessment Details [name], [name] 10/29/2021 8:45 A M Medical Record Number: [id number] Patient Account Number: [id number] Date of Birth/Sex: Treating RN: 18-Sep-1956 (66 y.o. [name] Primary Care Emersen Mascari: [name] Other Clinician: Referring Aizlynn Digilio: Treating Carnita Golob/Extender: [name] in Treatment: 3 Wound Status Wound Number: 27 Primary Venous Leg Ulcer Etiology: Wound Location: Left, Posterior Lower Leg Wound Open Wounding Event: Gradually Appeared Status: Date Acquired: 09/07/2021 Comorbid Anemia, Sleep Apnea, Congestive Heart Failure, Coronary Artery Weeks Of Treatment: 3 History: Disease, Hypertension, Peripheral Venous Disease, Type II Clustered Wound: No Diabetes, Osteoarthritis, Neuropathy Photos Wound Measurements Length: (cm) 19.5 Width: (cm) 16.5 Depth: (cm) 0.3 Area: (cm) 252.702 Volume: (cm) 75.811 % Reduction in Area: 2.5% % Reduction in Volume: 2.5% Epithelialization: Small (1-33%) Tunneling: No Undermining: No Wound Description Classification: Full Thickness Without Exposed Support Structures Wound Margin: Distinct, outline attached Exudate Amount: Large Exudate Type: Sanguinous Exudate Color: red Foul Odor After Cleansing: No Slough/Fibrino Yes Wound Bed Granulation Amount: Medium (34-66%) Exposed Structure Granulation Quality: Red Fascia Exposed: No Necrotic Amount: Medium (34-66%) Fat Layer (Subcutaneous Tissue) Exposed: Yes Necrotic Quality: Adherent Slough Tendon Exposed: No Muscle Exposed: No Joint Exposed: No Bone Exposed: No Treatment Notes Wound #27 (Lower Leg) Wound Laterality: Left, Posterior Cleanser Anasept Antimicrobial Skin and Wound Cleanser, 8 (oz) Discharge Instruction: Cleanse the wound with Anasept cleanser prior to applying a clean  dressing using gauze sponges, not tissue or cotton balls. Peri-Wound Care Zinc Oxide Ointment 30g tube Discharge Instruction: Apply Zinc Oxide to periwound with each dressing change Sween Lotion (Moisturizing lotion) Discharge Instruction: Apply moisturizing lotion as directed Topical Gentamicin Discharge Instruction: Apply directly to wound bed, under silver alginate Primary Dressing KerraCel Ag Gelling Fiber Dressing, 4x5 in (silver alginate) Discharge Instruction: Apply silver alginate to wound bed as instructed Secondary Dressing ABD Pad, 8x10 Discharge Instruction: Apply over primary dressing as directed. Secured With Compression Wrap Kerlix Roll 4.5x3.1 (in/yd) Discharge Instruction: Apply Kerlix and Coban compression as directed. Coban Self-Adherent Wrap 4x5 (in/yd) Discharge Instruction: Apply over Kerlix as directed. Compression Stockings Add-Ons Electronic Signature(s) Signed: 10/29/2021 2:05:18 PM By: Levan Hurst RN, BSN Signed: 10/30/2021 8:57:49 AM By: Sandre Kitty Entered By: Sandre Kitty on 10/29/2021 09:24:19 -------------------------------------------------------------------------------- [place] Details [name], [name] 10/29/2021 8:45 A M Medical Record Number: [id number] Patient Account Number: [id number] Date of Birth/Sex: Treating RN: 23-Nov-1955 (66 y.o. [name] Primary Care Radie Berges: [name] Other Clinician: Referring Richad Ramsay: Treating Jidenna Figgs/Extender: [name] in Treatment: 3 Vital  Signs Time Taken: 08:52 Temperature (F): 98.6 Height (in): 77 Pulse (bpm): 81 Weight (lbs): 258 Respiratory Rate (breaths/min): 18 Body Mass Index (BMI): 30.6 Blood Pressure (mmHg): 118/77 Reference Range: 80 - 120 mg / dl Electronic Signature(s) Signed: 10/29/2021 2:05:18 PM By: Levan Hurst RN, BSN Entered By: Levan Hurst on 10/29/2021 09:14:54

## 2021-11-05 ENCOUNTER — Other Ambulatory Visit: Payer: Self-pay

## 2021-11-05 ENCOUNTER — Encounter (HOSPITAL_BASED_OUTPATIENT_CLINIC_OR_DEPARTMENT_OTHER): Payer: Medicare Other | Admitting: Internal Medicine

## 2021-11-05 DIAGNOSIS — L97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ed: Secondary | ICD-10-CM | POA: Diagnosis not present

## 2021-11-05 DIAGNOSIS — L97912 Non-pressure chronic ulcer of unspecified part of right lower leg with fat layer exposed: Secondary | ICD-10-CM

## 2021-11-05 DIAGNOSIS — I87333 Chronic venous hypertension (idiopathic) with ulcer and inflammation of bilateral lower extremity: Secondary | ICD-10-CM

## 2021-11-05 DIAGNOSIS — I890 Lymphedema, not elsewhere classified: Secondary | ICD-10-CM | POA: Diagnosis not present

## 2021-11-05 DIAGNOSIS — E11622 Type 2 diabetes mellitus with other skin ulcer: Secondary | ICD-10-CM | POA: Diagnosis not present

## 2021-11-05 NOTE — Progress Notes (Signed)
[name] [name] ([id number]) Visit Report for 11/05/2021 Chief Complaint Document Details Patient Name: Date of Service: [name] [name] 11/05/2021 10:00 [name]: [id number] Patient Account Number: [id number] Date of Birth/Sex: Treating RN: 1955/12/16 (66 y.o. [name] [name] Primary Care Provider: Seward [name] Other Clinician: Referring Provider: Treating Provider/Extender: [name] [name] in Treatment: 4 Information Obtained from: Patient Chief Complaint 10/08/2021; bilateral lower extremity wounds Electronic Signature(s) Signed: 11/05/2021 12:26:13 PM By: [name] DO Entered By: [name] on 11/05/2021 12:17:51 -------------------------------------------------------------------------------- HPI Details Patient Name: Date of Service: [name] [name] 11/05/2021 10:00 [place] Record Number: [id number] Patient Account Number: [id number] Date of Birth/Sex: Treating RN: [birth year] (66 y.o. [name] [name] Primary Care Provider: Seward [name] Other Clinician: Referring Provider: Treating Provider/Extender: [name] [name] in Treatment: 4 History of Present Illness HPI Description: 07/07/15; this is a patient who is in hospital on 8/2 through 8/4. He had cellulitis and abscess of predominantly I think the left leg. He received IV antibiotics. Plain x-ray showed no osteomyelitis. An MRI of the left leg did not show osteomyelitis. Cultures showed no predominant organism. His hemoglobin A1c was 9.8. He has a history of venous stasis also peripheral vascular disease. He was discharged to [facility]. He really has extensive ulcerations on the left lateral leg including a major wound that communicates both posteriorly and superiorly w. When drainage coming out of 2 smaller areas. He has a smaller wound on the posterior medial left leg. He has more predominantly venous insufficiency wounds on  predominantly the right medial leg above the ankle the right foot into sections. He has recently been put on Augmentin at the nursing home. Previous ABIs/arterial evaluation showed triphasic waves diffusely he does not have a major ischemic issue a left lower extremity venous duplex also exam showed no evidence of a DVT on 6/21 07/21/15; the patient arrives with really no major change. Culture I did last time was negative. He has not had a follow-up MRI I ordered. The wounds are macerated covered with a thick gelatinous surface slough. There is necrotic subcutaneous tissue 08/04/15; the patient arrives with a considerable improvement in the majority of his wound area. The area on the left leg now has what looks to be a granulated base. Most of the wounds on the left leg required an aggressive surgical debridement to remove nonviable fibrinous eschar and subcutaneous tissue however after debridement most of this looks better. Although I had asked for repeat MRI of the left leg when he first came in here with grossly purulent material coming out of his wounds, it does not appear that this is been done and nor do I actually feel that strongly about it right now. He has severe surrounding venous insufficiency and inflammation. I don't believe he has significant PAD 08/25/15. In general there is still a considerable wound area here but with still extensive service adherent slough. The patient will not allow mechanical debridement due to pain. He did not tolerate Medihoney therefore we are left with Santyl for now. She would appear that he has severe surrounding venous stasis. There is no evidence of the infection that may have had something to do with the pathogenesis of these wounds 09/29/15; patient still has substantial wound area on the left leg with a cluster of several wounds on the lateral leg confluently on the left leg posteriorly and then a substantial wound on the medial leg. On the right leg  a  substantial wound medially and a small area on the right lateral foot. All of these underwent a substantial surgical debridement with curettes which she tolerated better than he has in the past. This started as a complex cellulitis in the face of chronic venous insufficiency and inflammation. 10/13/15; substantial cluster of wounds on the lateral aspect of his left leg confluently around to the other side. Extensive surgical debridement to remove for redness surface slough nonviable subcutaneous tissue. This is not an improvement. Also substantial wound on the medial right leg which is largely unchanged. The etiology of this was felt to be a complex cellulitis in the summer of 2016 in the face of chronic venous insufficiency and inflammation. The patient states that the wound on the right medial leg has been there for years off and on. 10/20/15; we are able to start [facility] to these extensive wound areas left greater than right on Tuesday after I spoke to the wound care nurse at the facility. He arrives here today for extensive surgical debridement for the wounds on the lateral aspect of his left leg posterior left leg, this is almost circumferential. He has a large wound on the medial aspect of his right leg although he finds this too painful for debridement 10/27/15; I continue to bring this patient back for frequent debridement/weekly debridement severe. We have been using Hydrofera Blue. Unfortunately this has not really had any improvement. The debridement surgery difficult and painful for the patient 11/23/15; this patient spent a complex hospitalization admitted with acute kidney injury, anemia, cellulitis of the lower extremity, he was felt to have sepsis pathophysiology although his blood cultures were negative. He had plain x-rays of both legs that were negative for osseous abnormalities. He was seen by infectious disease and placed on a workup for vasculitis that was negative including a  biopsy 4 all were consistent with stasis dermatitis. Vital broad- spectrum antibiotics he continued to spike fevers. Urine and chest x-ray were negative Dopplers on admission ruled out a DVT All antibiotics were stopped on . 2/17. Since his return to [facility] skilled nursing facility I believe they have been using Xeroform 12/07/15 the patient arrives today with the area on his right medial leg actually looking quite stable. No debridement. The rest of his extensive wounds on the left lateral left posterior extending into the left medial leg all required extensive debridement. I think this is probably going to need to and up in the hands of plastic surgery. We'll attempt to change him back to [facility]. Arrange consultation with plastic surgery at [facility] for this almost circumferential wound on the left side 12/21/15 right leg covered in surface slough. This was debridement. Left leg extensive wounds all carefully examined. This is almost circumferential on the left side especially on the posterior calf. No debridement is necessary. 01/04/16 the patient has been to [facility] plastic surgery. Unfortunately it doesn't look like they had any of the prior workup on this patient. They're in the middle of vascular workup. It sounds as though they're applying Hydrofera Blue at the nursing home. 01/22/16; the patient has been back to see [facility]. There apparently making plans to possibly do skin grafts over his large venous insufficiency ulcerations. The patient tells me that he goes to hematology in [facility] and variably uses the term platelets red cells and the description of his problem. 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 I do not see any  major difference in the wound on the right medial leg and circumferentially across the left medial to left lateral leg. Did not attempt to debride these today. Readmission: 05/05/18 on evaluation today patient  presents for reevaluation here in our clinic although I have previously seen him in the skilled nursing facility over the past year and a half when I was working in the skilled nursing facility realm instead of covering in the clinic. With that being said during that time we had initiated most recently dressings with Hydrofera Blue Dressing along with the [employer] which had done very well for him. Much better than the Kerlex Coban wraps. With that being said the patient had been doing fairly well and was making progress when I last saw him. Upon evaluation today it appears that the wounds are actually a little bit worse than when I last saw him although definitely not dramatically so. There does not appear to be any evidence of infection which is good news. With that being said he has been tolerating the wraps without complication his left hip still gives him a lot of trouble which is his main issue as far as movement is concerned. Unbeknownst to me he has not had venous studies that I can find. He previously told me he had there were arterial studies noted back from 04/27/15 which showed that he had try phasic blood flow throughout and again his test appear to be completely normal as performed by Dr. Creig Hines. With that being said I cannot find where he had venous studies. The patient still states he thinks he did these definitely had no venous intervention at this point. Upon evaluation today it does not appear to me that the patient has any evidence of cellulitis. He does have some chronic venous insufficiency which I think has led to stasis dermatitis but again this is not appearing to be infected at this point. No fevers, chills, nausea, or vomiting noted at this time. READMISSION 06/30/2018 This is a now 66 year old man we have had in this clinic at multiple times in the past. Most recently he came in August and was seen by [facility]. It is not clear why he did not come back we asked him  really did not get a straight answer. He is at [facility] skilled facility he is a man who has severe chronic venous insufficiency with lymphedema and has had severe wounds on his left greater than right leg. We had him here in 2016 and 17 ultimately referred him to [facility]. He had arterial studies and venous studies done at [facility] although I am not able to access these records I see they are actually done. He also had multiple biopsies that were negative for malignancy showing changes compatible with chronic inflammation. As I understand things in [place] they are using Iodoflex and Unna boots. I am not really sure how they are getting enough Iodoflex for the large wound area especially on his left calf. Nevertheless the wounds look better than when I saw this in 2017. There were plans for him to have plastic surgery in 2018 and I think he was actually prepped for surgery however he was ultimately denied because the patient was an active smoker. The patient is not systemically unwell. The wounds are painful however given the size especially on the left this is not surprising. ABIs in this clinic were 0.78 on the left and 0.91 on the right 07/13/2018; this is a patient with bilateral  severe venous ulcers with secondary lymphedema. The wounds are on the right medial calf and a substantial part of the left posterior calf and some involvement medially and laterally on the left. We changed him to silver alginate under Unna boot's last time. The wounds actually look fairly satisfactory today. 08/14/2018; this is a patient with severe bilateral venous stasis ulcers with secondary lymphedema left greater than right he is at [facility] using silver alginate under Unna boot's I do not think there is much change on this visit versus last time I saw him a month ago Readmission: 11/04/18 patient presents today for follow-up concerning his bilateral lower extremity ulcers. His a right medial ankle  ulcer and a left leg that has ulcers over a large proportion of the surface area between the ankle and knee. Unfortunately this does cause some discomfort for the patient although it doesn't seem to be as uncomfortable as it has been in the past. He is seen today for evaluation after a referral by [place] back to the clinic. Unfortunately has a lot of Slough noted on the surface of his wounds. He's been treated currently it sounds like with Dakin's soaked gauze and they have not been performing the Unna Boot wrap that was previously recommended. I'm not exactly sure what the reason for the change was. He does have less slough on the surface of the wound but again this is something that is often a constant issue for him. Again he's had these wounds for many years. I've known him for two years at least during that time when I was taking care of them in the facility he is Artie had the wounds for several years prior. READMISSION 02/25/2020 [name] [name] is a now 66 year old man. He has been in our clinic several times before most extensively in October 2016 through May 2017. At that time he had absolutely substantial bilateral lower extremity wounds secondary to chronic venous insufficiency and lymphedema. We ultimately referred him to [facility] he had a series of biopsies only showed suggestion of wound secondary to chronic venous insufficiency. He had a less extensive stay in our clinic in 2019 and then a single visit in February 2020. He is a resident at [facility] skilled facility. I think he has had intermittently had in facility wound care. He returns today. They are using silver alginate on the wounds although I am not sure what type of compression. Previously he has favored Unna boots. He is not felt to have an arterial issue. Past medical history; lymphedema and chronic venous insufficiency, diabetes mellitus, hypertension, congestive heart failure We did not do arterial studies today 04/27/2020 on  evaluation patient appears to be doing really about the same as when I have known him and seen him in the past. He does have wounds on the right and left legs at this point. Fortunately there is no signs of active infection at this time. 9/2; 1 month follow-up. This is a man with severe chronic venous insufficiency and lymphedema. When I first met him he had horrible circumferential bilateral lower extremity ulcers. We have been using silver alginate up until early last month when it was changed to [facility] and Unna boot's more or less says maintenance dressings. Since the last time he was here the facility where he resides [facility] called to report they could no longer do Unna boot so he is apparently only been receiving kerlix Coban the left leg is certainly a lot worse with almost circumferential large  wounds especially lateral but now medial and posterior as well. He has a standard same looking wound on the right medial lower leg. 10/1; absolutely no improvement here. In fact I do not think anything much is changed. The substantial area on his left lateral and left posterior calf is still open may be slightly larger. He has a more modest wound on the right medial calf. None of these look any different. He comes in with a kerlix Coban wrap this will simply not be adequate. He has too much edema in this leg He also is complaining a lot of pain in his left heel area. 11/8; potential wounds on the left lateral and posterior calf and a smaller oval wound in the right medial. We have been using Hydrofera Blue under 4-layer compression. He was using Unna boots still 2 months ago the facility called to say they can no longer place these so we have been using 4-layer compression. The surface area of the area on the left is so large there is very few alternatives to what we can use on this either silver alginate or Hydrofera Blue. He was complaining of pain on the left heel last time I asked for  an x-ray this was apparently done although we do not have the result. He is not complaining of pain today. 08/16/2020 on evaluation today patient is is seen for a early visit due to the fact that he apparently is having issues I was told when they called on Monday with a MRSA infection that they felt like we needed to see him for because his legs "looked horrible". With that being said based on what I see at this point it does not appear that the patient is actually having a terrible infection in fact compared to last time I saw him his legs do not appear to be doing too poorly at all at this time. Nonetheless he has been on doxycycline that is not going to be a good option for him based on the culture report that I have for review today which graded as a partial report with still several organisms pending as far as identification is concerned we did contact them but again they do not have the final report yet. Either way based on what we see it appears that doxycycline is one of the few medications that will not work for the Citrobacter. Obviously I think that a different medication would be a good option for him since there is a gram-negative organism pending I am likely can I suggest Cipro as the best of backup antibiotic to switch him to at this point. All this was discussed with the patient today. 12/20; not much change in the substantial wound on the left posterior calf and the small area on the right medial. He has a new area on the left anterior which looks like a wrap injury. Had some thoughts about doing a deep tissue culture here although we will put that off for next time. Using silver alginate as a primary dressing 2/10; substantial area on the left posterior calf. This measures smaller but still is a very large area. He has the area on the right anterior medial which also is a fairly large wound but much smaller than not on the left. His edema control is quite good. We have been using  silver alginate because of the size of the wounds. 4/7; substantial wound on the left posterior calf and a smaller area on the right medial lower leg.  These are very chronic wounds which we have classified is venous. Previously worked up at [facility] for 5 years ago at which time I had sent and will send him over for consideration of extensive skin graft I know they biopsied this many times but he never had plastic surgery. The wound area is much too large for standard size dressings we have been using silver alginate but we did give him a good trial last fall of Hydrofera Blue that did not make any difference either. If anything the area on the right medial lower leg over time has gotten a lot smaller as has the area on the left although it still quite substantial now. Readmission 10/08/2021 Mr. [name] [name] is a 66 year old male with a past medical history of insulin-dependent type 2 diabetes with last hemoglobin A1c of 10.2, chronic venous insufficiency, lymphedema and chronic diastolic heart failure that presents to the clinic for bilateral lower extremity wounds. He has been followed in our clinic previously for the past 6 years for these issues. He was last seen in April 2022. He is inconsistent with his appointments in our clinic. These wounds have not healed over the past few years. He is currently using silver alginate to the wound beds and keeping these covered with Kerlix. He currently denies systemic signs of infection. He was recently hospitalized for septic shock secondary to bacteremia from likely chronic leg wounds And is currently on oral antibiotics to be completed this week. 1/30; patient presents for follow-up. He has no issues or complaints today. 2/6; patient presents for follow-up. He reports more discomfort with the increase in compression wraps. He currently denies systemic signs of infection and feels well overall. 2/13; patient presents for follow-up. He reports tolerating the  current leg/Coban wrap well. He denies systemic signs of infection and feels fine overall. Electronic Signature(s) Signed: 11/05/2021 12:26:13 PM By: [name] DO Entered By: [name] on 11/05/2021 12:18:22 -------------------------------------------------------------------------------- Physical Exam Details Patient Name: Date of Service: [name] [name] 11/05/2021 10:00 [place] Medical Record Number: [id number] Patient Account Number: [id number] Date of Birth/Sex: Treating RN: 27-Jun-1956 (66 y.o. [name] [name] Primary Care Provider: Seward [name] Other Clinician: Referring Provider: Treating Provider/Extender: [name] [name] in Treatment: 4 Constitutional respirations regular, non-labored and within target range for patient.. Cardiovascular 2+ dorsalis pedis/posterior tibialis pulses. Psychiatric pleasant and cooperative. Notes Right lower extremity: Open wounds with granulation tissue throughout. Left lower extremity: 2 large wounds with mostly granulation tissue throughout. No signs of surrounding infection. Lymphedema skin changes bilaterally. Faint pedal pulses. Electronic Signature(s) Signed: 11/05/2021 12:26:13 PM By: [name] DO Entered By: [name] on 11/05/2021 12:19:34 -------------------------------------------------------------------------------- Physician Orders Details Patient Name: Date of Service: [name] [name] 11/05/2021 10:00 [place] Record Number: [id number] Patient Account Number: [id number] Date of Birth/Sex: Treating RN: 1956/06/14 (65 y.o. [name] [name] Primary Care Provider: Seward [name] Other Clinician: Referring Provider: Treating Provider/Extender: [name] [name] in Treatment: 4 Verbal / Phone Orders: No Diagnosis Coding ICD-10 Coding Code Description [phone] Chronic venous hypertension (idiopathic) with ulcer and inflammation of bilateral lower  extremity 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ed L97.912 Non-pressure chronic ulcer of unspecified part of right lower leg with fat layer exposed I89.0 Lymphedema, not elsewhere classified Follow-up Appointments ppointment in 1 week. - *****Extra Time**** 75 minutes - ***HOYER*** Return A Dr Heber Wray ppointment in: - referral to infectious disease Return A Bathing/ Shower/ Hygiene  May shower and wash wound with soap and water. - when changing dressing Edema Control - Lymphedema / SCD / Other Elevate legs to the level of the heart or above for 30 minutes daily and/or when sitting, a frequency of: - throughout the day Moisturize legs daily. - with dressing changes Additional Orders / Instructions Follow Nutritious Diet - Monitor/Control Blood Sugars-Continue using ProStat Other: - Go to emergency room for fever, chills, strong odor from wounds, increased pain in wounds Wound Treatment Wound #25 - Lower Leg Wound Laterality: Right, Medial Cleanser: Anasept Antimicrobial Skin and Wound Cleanser, 8 (oz) 3 x Per Week/30 Days Discharge Instructions: Cleanse the wound with Anasept cleanser prior to applying a clean dressing using gauze sponges, not tissue or cotton balls. Peri-Wound Care: Zinc Oxide Ointment 30g tube 3 x Per Week/30 Days Discharge Instructions: Apply Zinc Oxide to periwound with each dressing change Peri-Wound Care: Sween Lotion (Moisturizing lotion) 3 x Per Week/30 Days Discharge Instructions: Apply moisturizing lotion as directed Topical: Gentamicin 3 x Per Week/30 Days Discharge Instructions: Apply directly to wound bed, under silver alginate Prim Dressing: KerraCel Ag Gelling Fiber Dressing, 4x5 in (silver alginate) 3 x Per Week/30 Days ary Discharge Instructions: Apply silver alginate to wound bed as instructed Secondary Dressing: ABD Pad, 8x10 3 x Per Week/30 Days Discharge Instructions: Apply over primary dressing as  directed. Compression Wrap: Kerlix Roll 4.5x3.1 (in/yd) 3 x Per Week/30 Days Discharge Instructions: Apply Kerlix and Coban compression as directed. Compression Wrap: Coban Self-Adherent Wrap 4x5 (in/yd) 3 x Per Week/30 Days Discharge Instructions: Apply over Kerlix as directed. Wound #26 - Lower Leg Wound Laterality: Left, Posterior, Proximal Cleanser: Anasept Antimicrobial Skin and Wound Cleanser, 8 (oz) 3 x Per Week/30 Days Discharge Instructions: Cleanse the wound with Anasept cleanser prior to applying a clean dressing using gauze sponges, not tissue or cotton balls. Peri-Wound Care: Zinc Oxide Ointment 30g tube 3 x Per Week/30 Days Discharge Instructions: Apply Zinc Oxide to periwound with each dressing change Peri-Wound Care: Sween Lotion (Moisturizing lotion) 3 x Per Week/30 Days Discharge Instructions: Apply moisturizing lotion as directed Topical: Gentamicin 3 x Per Week/30 Days Discharge Instructions: Apply directly to wound bed, under silver alginate Prim Dressing: KerraCel Ag Gelling Fiber Dressing, 4x5 in (silver alginate) 3 x Per Week/30 Days ary Discharge Instructions: Apply silver alginate to wound bed as instructed Secondary Dressing: ABD Pad, 8x10 3 x Per Week/30 Days Discharge Instructions: Apply over primary dressing as directed. Secondary Dressing: Zetuvit Plus 4x8 in 3 x Per Week/30 Days Discharge Instructions: or other superabsorbant pad. Apply over primary dressing as directed. Compression Wrap: Kerlix Roll 4.5x3.1 (in/yd) 3 x Per Week/30 Days Discharge Instructions: Apply Kerlix and Coban compression as directed. Compression Wrap: Coban Self-Adherent Wrap 4x5 (in/yd) 3 x Per Week/30 Days Discharge Instructions: Apply over Kerlix as directed. Wound #27 - Lower Leg Wound Laterality: Left, Posterior Cleanser: Anasept Antimicrobial Skin and Wound Cleanser, 8 (oz) 3 x Per Week/30 Days Discharge Instructions: Cleanse the wound with Anasept cleanser prior to applying a  clean dressing using gauze sponges, not tissue or cotton balls. Peri-Wound Care: Zinc Oxide Ointment 30g tube 3 x Per Week/30 Days Discharge Instructions: Apply Zinc Oxide to periwound with each dressing change Peri-Wound Care: Sween Lotion (Moisturizing lotion) 3 x Per Week/30 Days Discharge Instructions: Apply moisturizing lotion as directed Topical: Gentamicin 3 x Per Week/30 Days Discharge Instructions: Apply directly to wound bed, under silver alginate Prim Dressing: KerraCel Ag Gelling Fiber Dressing, 4x5 in (silver alginate) 3 x  Per Week/30 Days ary Discharge Instructions: Apply silver alginate to wound bed as instructed Secondary Dressing: ABD Pad, 8x10 3 x Per Week/30 Days Discharge Instructions: Apply over primary dressing as directed. Secondary Dressing: Zetuvit Plus 4x8 in 3 x Per Week/30 Days Discharge Instructions: or other superabsorbant pad. Apply over primary dressing as directed. Compression Wrap: Kerlix Roll 4.5x3.1 (in/yd) 3 x Per Week/30 Days Discharge Instructions: Apply Kerlix and Coban compression as directed. Compression Wrap: Coban Self-Adherent Wrap 4x5 (in/yd) 3 x Per Week/30 Days Discharge Instructions: Apply over Kerlix as directed. Wound #28 - Ankle Wound Laterality: Right, Lateral Cleanser: Anasept Antimicrobial Skin and Wound Cleanser, 8 (oz) 3 x Per Week/30 Days Discharge Instructions: Cleanse the wound with Anasept cleanser prior to applying a clean dressing using gauze sponges, not tissue or cotton balls. Peri-Wound Care: Zinc Oxide Ointment 30g tube 3 x Per Week/30 Days Discharge Instructions: Apply Zinc Oxide to periwound with each dressing change Peri-Wound Care: Sween Lotion (Moisturizing lotion) 3 x Per Week/30 Days Discharge Instructions: Apply moisturizing lotion as directed Topical: Gentamicin 3 x Per Week/30 Days Discharge Instructions: Apply directly to wound bed, under silver alginate Prim Dressing: KerraCel Ag Gelling Fiber Dressing, 4x5 in  (silver alginate) 3 x Per Week/30 Days ary Discharge Instructions: Apply silver alginate to wound bed as instructed Secondary Dressing: ABD Pad, 8x10 3 x Per Week/30 Days Discharge Instructions: Apply over primary dressing as directed. Compression Wrap: Kerlix Roll 4.5x3.1 (in/yd) 3 x Per Week/30 Days Discharge Instructions: Apply Kerlix and Coban compression as directed. Compression Wrap: Coban Self-Adherent Wrap 4x5 (in/yd) 3 x Per Week/30 Days Discharge Instructions: Apply over Kerlix as directed. Electronic Signature(s) Signed: 11/05/2021 12:26:13 PM By: [name] DO Entered By: [name] on 11/05/2021 12:19:58 -------------------------------------------------------------------------------- Problem List Details Patient Name: Date of Service: [name] [name] 11/05/2021 10:00 [place] Record Number: [id number] Patient Account Number: [id number] Date of Birth/Sex: Treating RN: 1956/09/09 (66 y.o. [name] [name] Primary Care Provider: Seward [name] Other Clinician: Referring Provider: Treating Provider/Extender: [name] [name] in Treatment: 4 Active Problems ICD-10 Encounter Code Description Active Date MDM Diagnosis I87.333 Chronic venous hypertension (idiopathic) with ulcer and inflammation of 10/08/2021 No Yes bilateral lower extremity 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ed1/16/2023 No Yes L97.912 Non-pressure chronic ulcer of unspecified part of right lower leg with fat layer 10/08/2021 No Yes exposed I89.0 Lymphedema, not elsewhere classified 10/08/2021 No Yes Inactive Problems Resolved Problems Electronic Signature(s) Signed: 11/05/2021 12:26:13 PM By: [name] DO Entered By: [name] on 11/05/2021 12:09:16 -------------------------------------------------------------------------------- Progress Note Details Patient Name: Date of Service: [name] [name] 11/05/2021  10:00 [place] Record Number: [id number] Patient Account Number: [id number] Date of Birth/Sex: Treating RN: [birth year] (66 y.o. [name] [name] Primary Care Provider: Seward [name] Other Clinician: Referring Provider: Treating Provider/Extender: [name] [name] in Treatment: 4 Subjective Chief Complaint Information obtained from Patient 10/08/2021; bilateral lower extremity wounds History of Present Illness (HPI) 07/07/15; this is a patient who is in hospital on 8/2 through 8/4. He had cellulitis and abscess of predominantly I think the left leg. He received IV antibiotics. Plain x-ray showed no osteomyelitis. An MRI of the left leg did not show osteomyelitis. Cultures showed no predominant organism. His hemoglobin A1c was 9.8. He has a history of venous stasis also peripheral vascular disease. He was discharged to [facility]. He really has extensive ulcerations on the left lateral leg including  a major wound that communicates both posteriorly and superiorly w. When drainage coming out of 2 smaller areas. He has a smaller wound on the posterior medial left leg. He has more predominantly venous insufficiency wounds on predominantly the right medial leg above the ankle the right foot into sections. He has recently been put on Augmentin at the nursing home. Previous ABIs/arterial evaluation showed triphasic waves diffusely he does not have a major ischemic issue a left lower extremity venous duplex also exam showed no evidence of a DVT on 6/21 07/21/15; the patient arrives with really no major change. Culture I did last time was negative. He has not had a follow-up MRI I ordered. The wounds are macerated covered with a thick gelatinous surface slough. There is necrotic subcutaneous tissue 08/04/15; the patient arrives with a considerable improvement in the majority of his wound area. The area on the left leg now has what looks to be a granulated base.  Most of the wounds on the left leg required an aggressive surgical debridement to remove nonviable fibrinous eschar and subcutaneous tissue however after debridement most of this looks better. Although I had asked for repeat MRI of the left leg when he first came in here with grossly purulent material coming out of his wounds, it does not appear that this is been done and nor do I actually feel that strongly about it right now. He has severe surrounding venous insufficiency and inflammation. I don't believe he has significant PAD 08/25/15. In general there is still a considerable wound area here but with still extensive service adherent slough. The patient will not allow mechanical debridement due to pain. He did not tolerate Medihoney therefore we are left with Santyl for now. She would appear that he has severe surrounding venous stasis. There is no evidence of the infection that may have had something to do with the pathogenesis of these wounds 09/29/15; patient still has substantial wound area on the left leg with a cluster of several wounds on the lateral leg confluently on the left leg posteriorly and then a substantial wound on the medial leg. On the right leg a substantial wound medially and a small area on the right lateral foot. All of these underwent a substantial surgical debridement with curettes which she tolerated better than he has in the past. This started as a complex cellulitis in the face of chronic venous insufficiency and inflammation. 10/13/15; substantial cluster of wounds on the lateral aspect of his left leg confluently around to the other side. Extensive surgical debridement to remove for redness surface slough nonviable subcutaneous tissue. This is not an improvement. Also substantial wound on the medial right leg which is largely unchanged. The etiology of this was felt to be a complex cellulitis in the summer of 2016 in the face of chronic venous insufficiency and inflammation.  The patient states that the wound on the right medial leg has been there for years off and on. 10/20/15; we are able to start [facility] to these extensive wound areas left greater than right on Tuesday after I spoke to the wound care nurse at the facility. He arrives here today for extensive surgical debridement for the wounds on the lateral aspect of his left leg posterior left leg, this is almost circumferential. He has a large wound on the medial aspect of his right leg although he finds this too painful for debridement 10/27/15; I continue to bring this patient back for frequent debridement/weekly debridement severe. We have been  using Hydrofera Blue. Unfortunately this has not really had any improvement. The debridement surgery difficult and painful for the patient 11/23/15; this patient spent a complex hospitalization admitted with acute kidney injury, anemia, cellulitis of the lower extremity, he was felt to have sepsis pathophysiology although his blood cultures were negative. He had plain x-rays of both legs that were negative for osseous abnormalities. He was seen by infectious disease and placed on a workup for vasculitis that was negative including a biopsy o4 all were consistent with stasis dermatitis. Vital broad-spectrum antibiotics he continued to spike fevers. Urine and chest x-ray were negative Dopplers on admission ruled out a DVT All antibiotics were stopped on 2/17. . Since his return to [facility] skilled nursing facility I believe they have been using Xeroform 12/07/15 the patient arrives today with the area on his right medial leg actually looking quite stable. No debridement. The rest of his extensive wounds on the left lateral left posterior extending into the left medial leg all required extensive debridement. I think this is probably going to need to and up in the hands of plastic surgery. We'll attempt to change him back to [facility]. Arrange consultation with  plastic surgery at [facility] for this almost circumferential wound on the left side 12/21/15 right leg covered in surface slough. This was debridement. Left leg extensive wounds all carefully examined. This is almost circumferential on the left side especially on the posterior calf. No debridement is necessary. 01/04/16 the patient has been to [facility] plastic surgery. Unfortunately it doesn't look like they had any of the prior workup on this patient. They're in the middle of vascular workup. It sounds as though they're applying Hydrofera Blue at the nursing home. 01/22/16; the patient has been back to see [facility]. There apparently making plans to possibly do skin grafts over his large venous insufficiency ulcerations. The patient tells me that he goes to hematology in [facility] and variably uses the term platelets red cells and the description of his problem. 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 I do not see any major difference in the wound on the right medial leg and circumferentially across the left medial to left lateral leg. Did not attempt to debride these today. Readmission: 05/05/18 on evaluation today patient presents for reevaluation here in our clinic although I have previously seen him in the skilled nursing facility over the past year and a half when I was working in the skilled nursing facility realm instead of covering in the clinic. With that being said during that time we had initiated most recently dressings with Hydrofera Blue Dressing along with the [employer] which had done very well for him. Much better than the Kerlex Coban wraps. With that being said the patient had been doing fairly well and was making progress when I last saw him. Upon evaluation today it appears that the wounds are actually a little bit worse than when I last saw him although definitely not dramatically so. There does not appear to be any evidence of  infection which is good news. With that being said he has been tolerating the wraps without complication his left hip still gives him a lot of trouble which is his main issue as far as movement is concerned. Unbeknownst to me he has not had venous studies that I can find. He previously told me he had there were arterial studies noted back from 04/27/15 which showed that he  had try phasic blood flow throughout and again his test appear to be completely normal as performed by Dr. Creig Hines. With that being said I cannot find where he had venous studies. The patient still states he thinks he did these definitely had no venous intervention at this point. Upon evaluation today it does not appear to me that the patient has any evidence of cellulitis. He does have some chronic venous insufficiency which I think has led to stasis dermatitis but again this is not appearing to be infected at this point. No fevers, chills, nausea, or vomiting noted at this time. READMISSION 06/30/2018 This is a now 66 year old man we have had in this clinic at multiple times in the past. Most recently he came in August and was seen by [facility]. It is not clear why he did not come back we asked him really did not get a straight answer. He is at [facility] skilled facility he is a man who has severe chronic venous insufficiency with lymphedema and has had severe wounds on his left greater than right leg. We had him here in 2016 and 17 ultimately referred him to [facility]. He had arterial studies and venous studies done at [facility] although I am not able to access these records I see they are actually done. He also had multiple biopsies that were negative for malignancy showing changes compatible with chronic inflammation. As I understand things in [place] they are using Iodoflex and Unna boots. I am not really sure how they are getting enough Iodoflex for the large wound area especially on his left calf. Nevertheless  the wounds look better than when I saw this in 2017. There were plans for him to have plastic surgery in 2018 and I think he was actually prepped for surgery however he was ultimately denied because the patient was an active smoker. The patient is not systemically unwell. The wounds are painful however given the size especially on the left this is not surprising. ABIs in this clinic were 0.78 on the left and 0.91 on the right 07/13/2018; this is a patient with bilateral severe venous ulcers with secondary lymphedema. The wounds are on the right medial calf and a substantial part of the left posterior calf and some involvement medially and laterally on the left. We changed him to silver alginate under Unna boot's last time. The wounds actually look fairly satisfactory today. 08/14/2018; this is a patient with severe bilateral venous stasis ulcers with secondary lymphedema left greater than right he is at [facility] using silver alginate under Unna boot's I do not think there is much change on this visit versus last time I saw him a month ago Readmission: 11/04/18 patient presents today for follow-up concerning his bilateral lower extremity ulcers. His a right medial ankle ulcer and a left leg that has ulcers over a large proportion of the surface area between the ankle and knee. Unfortunately this does cause some discomfort for the patient although it doesn't seem to be as uncomfortable as it has been in the past. He is seen today for evaluation after a referral by [place] back to the clinic. Unfortunately has a lot of Slough noted on the surface of his wounds. He's been treated currently it sounds like with Dakin's soaked gauze and they have not been performing the Unna Boot wrap that was previously recommended. I'm not exactly sure what the reason for the change was. He does have less slough on the surface of  the wound but again this is something that is often a constant issue for him. Again he's had  these wounds for many years. I've known him for two years at least during that time when I was taking care of them in the facility he is Artie had the wounds for several years prior. READMISSION 02/25/2020 [name] [name] is a now 66 year old man. He has been in our clinic several times before most extensively in October 2016 through May 2017. At that time he had absolutely substantial bilateral lower extremity wounds secondary to chronic venous insufficiency and lymphedema. We ultimately referred him to [facility] he had a series of biopsies only showed suggestion of wound secondary to chronic venous insufficiency. He had a less extensive stay in our clinic in 2019 and then a single visit in February 2020. He is a resident at [facility] skilled facility. I think he has had intermittently had in facility wound care. He returns today. They are using silver alginate on the wounds although I am not sure what type of compression. Previously he has favored Unna boots. He is not felt to have an arterial issue. Past medical history; lymphedema and chronic venous insufficiency, diabetes mellitus, hypertension, congestive heart failure We did not do arterial studies today 04/27/2020 on evaluation patient appears to be doing really about the same as when I have known him and seen him in the past. He does have wounds on the right and left legs at this point. Fortunately there is no signs of active infection at this time. 9/2; 1 month follow-up. This is a man with severe chronic venous insufficiency and lymphedema. When I first met him he had horrible circumferential bilateral lower extremity ulcers. We have been using silver alginate up until early last month when it was changed to [facility] and Unna boot's more or less says maintenance dressings. Since the last time he was here the facility where he resides [facility] called to report they could no longer do Unna boot so he is apparently only been receiving  kerlix Coban the left leg is certainly a lot worse with almost circumferential large wounds especially lateral but now medial and posterior as well. He has a standard same looking wound on the right medial lower leg. 10/1; absolutely no improvement here. In fact I do not think anything much is changed. The substantial area on his left lateral and left posterior calf is still open may be slightly larger. He has a more modest wound on the right medial calf. None of these look any different. He comes in with a kerlix Coban wrap this will simply not be adequate. He has too much edema in this leg He also is complaining a lot of pain in his left heel area. 11/8; potential wounds on the left lateral and posterior calf and a smaller oval wound in the right medial. We have been using Hydrofera Blue under 4-layer compression. He was using Unna boots still 2 months ago the facility called to say they can no longer place these so we have been using 4-layer compression. The surface area of the area on the left is so large there is very few alternatives to what we can use on this either silver alginate or Hydrofera Blue. He was complaining of pain on the left heel last time I asked for an x-ray this was apparently done although we do not have the result. He is not complaining of pain today. 08/16/2020 on evaluation today patient is is seen  for a early visit due to the fact that he apparently is having issues I was told when they called on Monday with a MRSA infection that they felt like we needed to see him for because his legs "looked horrible". With that being said based on what I see at this point it does not appear that the patient is actually having a terrible infection in fact compared to last time I saw him his legs do not appear to be doing too poorly at all at this time. Nonetheless he has been on doxycycline that is not going to be a good option for him based on the culture report that I have for review  today which graded as a partial report with still several organisms pending as far as identification is concerned we did contact them but again they do not have the final report yet. Either way based on what we see it appears that doxycycline is one of the few medications that will not work for the Citrobacter. Obviously I think that a different medication would be a good option for him since there is a gram-negative organism pending I am likely can I suggest Cipro as the best of backup antibiotic to switch him to at this point. All this was discussed with the patient today. 12/20; not much change in the substantial wound on the left posterior calf and the small area on the right medial. He has a new area on the left anterior which looks like a wrap injury. Had some thoughts about doing a deep tissue culture here although we will put that off for next time. Using silver alginate as a primary dressing 2/10; substantial area on the left posterior calf. This measures smaller but still is a very large area. He has the area on the right anterior medial which also is a fairly large wound but much smaller than not on the left. His edema control is quite good. We have been using silver alginate because of the size of the wounds. 4/7; substantial wound on the left posterior calf and a smaller area on the right medial lower leg. These are very chronic wounds which we have classified is venous. Previously worked up at [facility] for 5 years ago at which time I had sent and will send him over for consideration of extensive skin graft I know they biopsied this many times but he never had plastic surgery. The wound area is much too large for standard size dressings we have been using silver alginate but we did give him a good trial last fall of Hydrofera Blue that did not make any difference either. If anything the area on the right medial lower leg over time has gotten a lot smaller as has the area on the left  although it still quite substantial now. Readmission 10/08/2021 Mr. [name] [name] is a 66 year old male with a past medical history of insulin-dependent type 2 diabetes with last hemoglobin A1c of 10.2, chronic venous insufficiency, lymphedema and chronic diastolic heart failure that presents to the clinic for bilateral lower extremity wounds. He has been followed in our clinic previously for the past 6 years for these issues. He was last seen in April 2022. He is inconsistent with his appointments in our clinic. These wounds have not healed over the past few years. He is currently using silver alginate to the wound beds and keeping these covered with Kerlix. He currently denies systemic signs of infection. He was recently hospitalized for septic shock secondary to  bacteremia from likely chronic leg wounds And is currently on oral antibiotics to be completed this week. 1/30; patient presents for follow-up. He has no issues or complaints today. 2/6; patient presents for follow-up. He reports more discomfort with the increase in compression wraps. He currently denies systemic signs of infection and feels well overall. 2/13; patient presents for follow-up. He reports tolerating the current leg/Coban wrap well. He denies systemic signs of infection and feels fine overall. Patient History Information obtained from Patient. Family History Diabetes - Mother,Father,Siblings, Hypertension - Mother,Father, No family history of Cancer, Heart Disease, Hereditary Spherocytosis, Kidney Disease, Lung Disease, Seizures, Stroke, Thyroid Problems, Tuberculosis. Social History Current every day smoker - 4 cig per day, Marital Status - Separated, Alcohol Use - Never, Drug Use - Prior History - cocaine, [place] quit 2005, Caffeine Use - Moderate. Medical History Eyes Denies history of Cataracts, Glaucoma, Optic Neuritis Ear/Nose/Mouth/Throat Denies history of Chronic sinus problems/congestion, Middle ear  problems Hematologic/Lymphatic Patient has history of Anemia - iron deficiency Denies history of Hemophilia, Human Immunodeficiency Virus, Lymphedema, Sickle Cell Disease Respiratory Patient has history of Sleep Apnea Denies history of Aspiration, Asthma, Chronic Obstructive Pulmonary Disease (COPD), Pneumothorax, Tuberculosis Cardiovascular Patient has history of Congestive Heart Failure, Coronary Artery Disease, Hypertension, Peripheral Venous Disease Denies history of Angina, Arrhythmia, Deep Vein Thrombosis, Hypotension, Myocardial Infarction, Peripheral Arterial Disease, Phlebitis, Vasculitis Gastrointestinal Denies history of Cirrhosis , Colitis, Crohnoos, Hepatitis A, Hepatitis B, Hepatitis C Endocrine Patient has history of Type II Diabetes - uncontrolled Denies history of Type I Diabetes Genitourinary Denies history of End Stage Renal Disease Immunological Denies history of Lupus Erythematosus, Raynaudoos, Scleroderma Integumentary (Skin) Denies history of History of Burn Musculoskeletal Patient has history of Osteoarthritis Denies history of Gout, Rheumatoid Arthritis, Osteomyelitis Neurologic Patient has history of Neuropathy Denies history of Dementia, Quadriplegia, Paraplegia, Seizure Disorder Oncologic Denies history of Received Chemotherapy, Received Radiation Psychiatric Denies history of Anorexia/bulimia, Confinement Anxiety Hospitalization/Surgery History - inguinal hernia repair with mesh. - right shoulder rotator cuff repair. - toe nail excision. - Sepsis 09/03/21-09/10/22. Medical A Surgical History Notes nd Gastrointestinal GERD Musculoskeletal degenerative righ thip Psychiatric depression Objective Constitutional respirations regular, non-labored and within target range for patient.. Vitals Time Taken: 10:16 AM, Height: 77 in, Source: Stated, Weight: 258 lbs, Source: Stated, BMI: 30.6, Temperature: 99.1 F, Pulse: 82 bpm, Respiratory Rate: 18  breaths/min, Blood Pressure: 98/62 mmHg. Cardiovascular 2+ dorsalis pedis/posterior tibialis pulses. Psychiatric pleasant and cooperative. General Notes: Right lower extremity: Open wounds with granulation tissue throughout. Left lower extremity: 2 large wounds with mostly granulation tissue throughout. No signs of surrounding infection. Lymphedema skin changes bilaterally. Faint pedal pulses. Integumentary (Hair, Skin) Wound #25 status is Open. Original cause of wound was Gradually Appeared. The date acquired was: 09/07/2021. The wound has been in treatment 4 weeks. The wound is located on the Right,Medial Lower Leg. The wound measures 5.6cm length x 2.8cm width x 0.2cm depth; 12.315cm^2 area and 2.463cm^3 volume. There is Fat Layer (Subcutaneous Tissue) exposed. There is no tunneling or undermining noted. There is a large amount of serosanguineous drainage noted. The wound margin is thickened. There is large (67-100%) red granulation within the wound bed. There is a small (1-33%) amount of necrotic tissue within the wound bed including Adherent Slough. Wound #26 status is Open. Original cause of wound was Gradually Appeared. The date acquired was: 09/07/2021. The wound has been in treatment 4 weeks. The wound is located on the Left,Proximal,Posterior Lower Leg. The wound measures 6cm length x 4.5cm  width x 0.1cm depth; 21.206cm^2 area and 2.121cm^3 volume. There is Fat Layer (Subcutaneous Tissue) exposed. There is no tunneling or undermining noted. There is a large amount of sanguinous drainage noted. The wound margin is distinct with the outline attached to the wound base. There is large (67-100%) red granulation within the wound bed. There is no necrotic tissue within the wound bed. Wound #27 status is Open. Original cause of wound was Gradually Appeared. The date acquired was: 09/07/2021. The wound has been in treatment 4 weeks. The wound is located on the Left,Posterior Lower Leg. The wound  measures 19cm length x 15.6cm width x 0.6cm depth; 232.792cm^2 area and 139.675cm^3 volume. There is Fat Layer (Subcutaneous Tissue) exposed. There is no tunneling or undermining noted. There is a large amount of sanguinous drainage noted. The wound margin is distinct with the outline attached to the wound base. There is medium (34-66%) red, friable granulation within the wound bed. There is a medium (34-66%) amount of necrotic tissue within the wound bed including Adherent Slough. Wound #28 status is Open. Original cause of wound was Gradually Appeared. The date acquired was: 11/05/2021. The wound is located on the Right,Lateral Ankle. The wound measures 6cm length x 0.8cm width x 0.1cm depth; 3.77cm^2 area and 0.377cm^3 volume. There is Fat Layer (Subcutaneous Tissue) exposed. There is no tunneling or undermining noted. There is a medium amount of serosanguineous drainage noted. The wound margin is flat and intact. There is large (67-100%) red granulation within the wound bed. There is a small (1-33%) amount of necrotic tissue within the wound bed including Adherent Slough. 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 No evidence of surrounding soft tissue infection. At last clinic visit a PCR culture showed high levels of Enterobacter cloacae, Morganella morganii, Enterococcus faecalis, Pseudomonas aeruginosa, Staph aureus, Proteus mirabilis, Proviencia stuartii, and Bacteroides fragilis. At this time I recommended doing [facility] antibiotic and we will give a prescription to the facility to order this. Due to the high levels of multiple organisms I would like to also refer to  infectious disease. I appreciate their input if they have other suggestions on approaching the infected wound bed. He currently denies systemic signs of infection. I did [name] that if he were to develop fevers, chills, nausea/vomiting, increased warmth or erythema to the leg he would need to go to the ED. We will continue with current therapy of gentamicin ointment, silver alginate under Kerlix/Coban. Plan Follow-up Appointments: Return Appointment in 1 week. - *****Extra Time**** 75 minutes - ***HOYER*** Dr Heber [place] Return Appointment in: - referral to infectious disease Bathing/ Shower/ Hygiene: May shower and wash wound with soap and water. - when changing dressing Edema Control - Lymphedema / SCD / Other: Elevate legs to the level of the heart or above for 30 minutes daily and/or when sitting, a frequency of: - throughout the day Moisturize legs daily. - with dressing changes Additional Orders / Instructions: Follow Nutritious Diet - Monitor/Control Blood Sugars-Continue using ProStat Other: - Go to emergency room for fever, chills, strong odor from wounds, increased pain in wounds WOUND #25: - Lower Leg Wound Laterality: Right, Medial Cleanser: Anasept Antimicrobial Skin and Wound Cleanser, 8 (oz) 3 x Per Week/30 Days Discharge Instructions: Cleanse the wound  with Anasept cleanser prior to applying a clean dressing using gauze sponges, not tissue or cotton balls. Peri-Wound Care: Zinc Oxide Ointment 30g tube 3 x Per Week/30 Days Discharge Instructions: Apply Zinc Oxide to periwound with each dressing change Peri-Wound Care: Sween Lotion (Moisturizing lotion) 3 x Per Week/30 Days Discharge Instructions: Apply moisturizing lotion as directed Topical: Gentamicin 3 x Per Week/30 Days Discharge Instructions: Apply directly to wound bed, under silver alginate Prim Dressing: KerraCel Ag Gelling Fiber Dressing, 4x5 in (silver alginate) 3 x Per Week/30 Days ary Discharge Instructions: Apply  silver alginate to wound bed as instructed Secondary Dressing: ABD Pad, 8x10 3 x Per Week/30 Days Discharge Instructions: Apply over primary dressing as directed. Com pression Wrap: Kerlix Roll 4.5x3.1 (in/yd) 3 x Per Week/30 Days Discharge Instructions: Apply Kerlix and Coban compression as directed. Com pression Wrap: Coban Self-Adherent Wrap 4x5 (in/yd) 3 x Per Week/30 Days Discharge Instructions: Apply over Kerlix as directed. WOUND #26: - Lower Leg Wound Laterality: Left, Posterior, Proximal Cleanser: Anasept Antimicrobial Skin and Wound Cleanser, 8 (oz) 3 x Per Week/30 Days Discharge Instructions: Cleanse the wound with Anasept cleanser prior to applying a clean dressing using gauze sponges, not tissue or cotton balls. Peri-Wound Care: Zinc Oxide Ointment 30g tube 3 x Per Week/30 Days Discharge Instructions: Apply Zinc Oxide to periwound with each dressing change Peri-Wound Care: Sween Lotion (Moisturizing lotion) 3 x Per Week/30 Days Discharge Instructions: Apply moisturizing lotion as directed Topical: Gentamicin 3 x Per Week/30 Days Discharge Instructions: Apply directly to wound bed, under silver alginate Prim Dressing: KerraCel Ag Gelling Fiber Dressing, 4x5 in (silver alginate) 3 x Per Week/30 Days ary Discharge Instructions: Apply silver alginate to wound bed as instructed Secondary Dressing: ABD Pad, 8x10 3 x Per Week/30 Days Discharge Instructions: Apply over primary dressing as directed. Secondary Dressing: Zetuvit Plus 4x8 in 3 x Per Week/30 Days Discharge Instructions: or other superabsorbant pad. Apply over primary dressing as directed. Com pression Wrap: Kerlix Roll 4.5x3.1 (in/yd) 3 x Per Week/30 Days Discharge Instructions: Apply Kerlix and Coban compression as directed. Com pression Wrap: Coban Self-Adherent Wrap 4x5 (in/yd) 3 x Per Week/30 Days Discharge Instructions: Apply over Kerlix as directed. WOUND #27: - Lower Leg Wound Laterality: Left, Posterior Cleanser:  Anasept Antimicrobial Skin and Wound Cleanser, 8 (oz) 3 x Per Week/30 Days Discharge Instructions: Cleanse the wound with Anasept cleanser prior to applying a clean dressing using gauze sponges, not tissue or cotton balls. Peri-Wound Care: Zinc Oxide Ointment 30g tube 3 x Per Week/30 Days Discharge Instructions: Apply Zinc Oxide to periwound with each dressing change Peri-Wound Care: Sween Lotion (Moisturizing lotion) 3 x Per Week/30 Days Discharge Instructions: Apply moisturizing lotion as directed Topical: Gentamicin 3 x Per Week/30 Days Discharge Instructions: Apply directly to wound bed, under silver alginate Prim Dressing: KerraCel Ag Gelling Fiber Dressing, 4x5 in (silver alginate) 3 x Per Week/30 Days ary Discharge Instructions: Apply silver alginate to wound bed as instructed Secondary Dressing: ABD Pad, 8x10 3 x Per Week/30 Days Discharge Instructions: Apply over primary dressing as directed. Secondary Dressing: Zetuvit Plus 4x8 in 3 x Per Week/30 Days Discharge Instructions: or other superabsorbant pad. Apply over primary dressing as directed. Com pression Wrap: Kerlix Roll 4.5x3.1 (in/yd) 3 x Per Week/30 Days Discharge Instructions: Apply Kerlix and Coban compression as directed. Com pression Wrap: Coban Self-Adherent Wrap 4x5 (in/yd) 3 x Per Week/30 Days Discharge Instructions: Apply over Kerlix as directed. WOUND #28: - Ankle Wound Laterality: Right, Lateral Cleanser: Anasept Antimicrobial Skin  and Wound Cleanser, 8 (oz) 3 x Per Week/30 Days Discharge Instructions: Cleanse the wound with Anasept cleanser prior to applying a clean dressing using gauze sponges, not tissue or cotton balls. Peri-Wound Care: Zinc Oxide Ointment 30g tube 3 x Per Week/30 Days Discharge Instructions: Apply Zinc Oxide to periwound with each dressing change Peri-Wound Care: Sween Lotion (Moisturizing lotion) 3 x Per Week/30 Days Discharge Instructions: Apply moisturizing lotion as directed Topical:  Gentamicin 3 x Per Week/30 Days Discharge Instructions: Apply directly to wound bed, under silver alginate Prim Dressing: KerraCel Ag Gelling Fiber Dressing, 4x5 in (silver alginate) 3 x Per Week/30 Days ary Discharge Instructions: Apply silver alginate to wound bed as instructed Secondary Dressing: ABD Pad, 8x10 3 x Per Week/30 Days Discharge Instructions: Apply over primary dressing as directed. Com pression Wrap: Kerlix Roll 4.5x3.1 (in/yd) 3 x Per Week/30 Days Discharge Instructions: Apply Kerlix and Coban compression as directed. Com pression Wrap: Coban Self-Adherent Wrap 4x5 (in/yd) 3 x Per Week/30 Days Discharge Instructions: Apply over Kerlix as directed. 1. Referral to infectious disease 2. [facility] antibiotic order 3. Silver alginate, gentamicin ointment under Kerlix/Coban 4. Follow-up in 1 week Electronic Signature(s) Signed: 11/05/2021 12:26:13 PM By: [name] DO Entered By: [name] on 11/05/2021 12:25:27 -------------------------------------------------------------------------------- HxROS Details Patient Name: Date of Service: [name] [name] 11/05/2021 10:00 [place] Record Number: [id number] Patient Account Number: [id number] Date of Birth/Sex: Treating RN: June 25, 1956 (66 y.o. [name] [name] Primary Care Provider: Seward [name] Other Clinician: Referring Provider: Treating Provider/Extender: [name] [name] in Treatment: 4 Information Obtained From Patient Eyes Medical History: Negative for: Cataracts; Glaucoma; Optic Neuritis Ear/Nose/Mouth/Throat Medical History: Negative for: Chronic sinus problems/congestion; Middle ear problems Hematologic/Lymphatic Medical History: Positive for: Anemia - iron deficiency Negative for: Hemophilia; Human Immunodeficiency Virus; Lymphedema; Sickle Cell Disease Respiratory Medical History: Positive for: Sleep Apnea Negative for: Aspiration; Asthma; Chronic Obstructive  Pulmonary Disease (COPD); Pneumothorax; Tuberculosis Cardiovascular Medical History: Positive for: Congestive Heart Failure; Coronary Artery Disease; Hypertension; Peripheral Venous Disease Negative for: Angina; Arrhythmia; Deep Vein Thrombosis; Hypotension; Myocardial Infarction; Peripheral Arterial Disease; Phlebitis; Vasculitis Gastrointestinal Medical History: Negative for: Cirrhosis ; Colitis; Crohns; Hepatitis A; Hepatitis B; Hepatitis C Past Medical History Notes: GERD Endocrine Medical History: Positive for: Type II Diabetes - uncontrolled Negative for: Type I Diabetes Time with diabetes: 1999 Treated with: Insulin Blood sugar tested every day: Yes T ested : 3x per day Blood sugar testing results: Breakfast: 81-200; Lunch: 180-210; Dinner: 200 Genitourinary Medical History: Negative for: End Stage Renal Disease Immunological Medical History: Negative for: Lupus Erythematosus; Raynauds; Scleroderma Integumentary (Skin) Medical History: Negative for: History of Burn Musculoskeletal Medical History: Positive for: Osteoarthritis Negative for: Gout; Rheumatoid Arthritis; Osteomyelitis Past Medical History Notes: degenerative righ thip Neurologic Medical History: Positive for: Neuropathy Negative for: Dementia; Quadriplegia; Paraplegia; Seizure Disorder Oncologic Medical History: Negative for: Received Chemotherapy; Received Radiation Psychiatric Medical History: Negative for: Anorexia/bulimia; Confinement Anxiety Past Medical History Notes: depression Immunizations Pneumococcal Vaccine: Received Pneumococcal Vaccination: Yes Received Pneumococcal Vaccination On or After 60th Birthday: No Implantable Devices None Hospitalization / Surgery History Type of Hospitalization/Surgery inguinal hernia repair with mesh right shoulder rotator cuff repair toe nail excision Sepsis 09/03/21-09/10/22 Family and Social History Cancer: No; Diabetes: Yes -  Mother,Father,Siblings; Heart Disease: No; Hereditary Spherocytosis: No; Hypertension: Yes - Mother,Father; Kidney Disease: No; Lung Disease: No; Seizures: No; Stroke: No; Thyroid Problems: No; Tuberculosis: No; Current every day smoker - 4 cig per day; Marital Status - Separated; Alcohol Use: Never; Drug  Use: Prior History - cocaine, [place] quit 2005; Caffeine Use: Moderate; Financial Concerns: No; Food, Clothing or Shelter Needs: No; Support System Lacking: No; Transportation Concerns: No Electronic Signature(s) Signed: 11/05/2021 12:26:13 PM By: [name] DO Signed: 11/05/2021 5:14:24 PM By: [name] RN, BSN Entered By: [name] on 11/05/2021 12:18:42 -------------------------------------------------------------------------------- [place] Details Patient Name: Date of Service: [name] [name] 11/05/2021 Medical Record Number: [id number] Patient Account Number: [id number] Date of Birth/Sex: Treating RN: [birth year] (66 y.o. [name] [name] Primary Care Provider: Seward [name] Other Clinician: Referring Provider: Treating Provider/Extender: [name] [name] in Treatment: 4 Diagnosis Coding ICD-10 Codes Code Description [phone] Chronic venous hypertension (idiopathic) with ulcer and inflammation of bilateral lower extremity 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ed L97.912 Non-pressure chronic ulcer of unspecified part of right lower leg with fat layer exposed I89.0 Lymphedema, not elsewhere classified Facility Procedures CPT4 Code: [id number] Description: [phone] - WOUND CARE VISIT-LEV 5 EST PT Modifier: Quantity: 1 Physician Procedures : CPT4 Code Description Modifier [id number] - WC PHYS LEVEL 3 - EST PT ICD-10 Diagnosis Description I87.333 Chronic venous hypertension (idiopathic) with ulcer and inflammation of bilateral lower extremity L97.822 Non-pressure chronic ulcer of other  part of  left lower leg with fat layer exposed L97.912 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

## 2021-11-05 NOTE — Progress Notes (Signed)
[name], [name] ([id number]) Visit Report for 11/05/2021 Arrival Information Details [name], [name] 11/05/2021 10:00 [place] Record Number: [id number] Patient Account Number: [id number] Date of Birth/Sex: Treating RN: 09-10-1956 (66 y.o. [name] Primary Care Tommie Dejoseph: [name] Other Clinician: Referring Melat Wrisley: Treating Elide Stalzer/Extender: [name] in Treatment: 4 Visit Information History Since Last Visit Added or deleted any medications: No Patient Arrived: Wheel Chair Any new allergies or adverse reactions: No Arrival Time: 10:18 Had a fall or experienced change in No Accompanied By: self activities of daily living that may affect Transfer Assistance: Harrel Lemon Lift risk of falls: Patient Identification Verified: Yes Signs or symptoms of abuse/neglect since last visito No Secondary Verification Process Completed: Yes Hospitalized since last visit: No Patient Requires Transmission-Based No Implantable device outside of the clinic excluding No Precautions: cellular tissue based products placed in the center Patient Has Alerts: Yes since last visit: Patient Alerts: Patient on Blood Thinner Has Dressing in Place as Prescribed: Yes *NO BLOOD Has Compression in Place as Prescribed: Yes PRODUCTS* Pain Present Now: Yes Electronic Signature(s) Signed: 11/05/2021 5:14:24 PM By: [name] RN, BSN Entered By: [name] on 11/05/2021 10:29:48 -------------------------------------------------------------------------------- [name], [name] 11/05/2021 10:00 A M Medical Record Number: [id number] Patient Account Number: [id number] Date of Birth/Sex: Treating RN: [birth year] (66 y.o. [name] Primary Care Roselyn Doby: [name] Other Clinician: Referring Arden Axon: Treating Lesette Frary/Extender: [name] in Treatment: 4 Clinic Level of Care Assessment Items TOOL 4 Quantity Score []  - 0 Use when only an EandM is performed on FOLLOW-UP visit ASSESSMENTS - Nursing Assessment / Reassessment X- 1 10 Reassessment of Co-morbidities (includes updates in patient status) X- 1 5 Reassessment of Adherence to Treatment Plan ASSESSMENTS - Wound and Skin A ssessment / Reassessment []  - 0 Simple Wound Assessment / Reassessment - one wound X- 4 5 Complex Wound Assessment / Reassessment - multiple wounds []  - 0 Dermatologic / Skin Assessment (not related to wound area) ASSESSMENTS - Focused Assessment X- 2 5 Circumferential Edema Measurements - multi extremities []  - 0 Nutritional Assessment / Counseling / Intervention X- 1 5 Lower Extremity Assessment (monofilament, tuning fork, pulses) []  - 0 Peripheral Arterial Disease Assessment (using hand held doppler) ASSESSMENTS - Ostomy and/or Continence Assessment and Care []  - 0 Incontinence Assessment and Management []  - 0 Ostomy Care Assessment and Management (repouching, etc.) PROCESS - Coordination of Care X - Simple Patient / Family Education for ongoing care 1 15 []  - 0 Complex (extensive) Patient / Family Education for ongoing care X- 1 10 Staff obtains Programmer, systems, Records, T Results / Process Orders est X- 1 10 Staff telephones HHA, Nursing Homes / Clarify orders / etc []  - 0 Routine Transfer to another Facility (non-emergent condition) []  - 0 Routine Hospital Admission (non-emergent condition) []  - 0 New Admissions / Biomedical engineer / Ordering NPWT Apligraf, etc. , []  - 0 Emergency Hospital Admission (emergent condition) X- 1 10 Simple Discharge Coordination []  - 0 Complex (extensive) Discharge Coordination PROCESS - Special Needs []  - 0 Pediatric / Minor Patient Management []  - 0 Isolation Patient Management []  - 0 Hearing / Language / Visual special needs []  - 0 Assessment of Community assistance  (transportation, D/C planning, etc.) []  - 0 Additional assistance / Altered mentation []  - 0 Support Surface(s) Assessment (bed, cushion, seat, etc.) INTERVENTIONS - Wound Cleansing / Measurement []  -  0 Simple Wound Cleansing - one wound X- 4 5 Complex Wound Cleansing - multiple wounds X- 1 5 Wound Imaging (photographs - any number of wounds) []  - 0 Wound Tracing (instead of photographs) []  - 0 Simple Wound Measurement - one wound X- 4 5 Complex Wound Measurement - multiple wounds INTERVENTIONS - Wound Dressings []  - 0 Small Wound Dressing one or multiple wounds X- 2 15 Medium Wound Dressing one or multiple wounds []  - 0 Large Wound Dressing one or multiple wounds X- 1 5 Application of Medications - topical []  - 0 Application of Medications - injection INTERVENTIONS - Miscellaneous []  - 0 External ear exam []  - 0 Specimen Collection (cultures, biopsies, blood, body fluids, etc.) []  - 0 Specimen(s) / Culture(s) sent or taken to Lab for analysis []  - 0 Patient Transfer (multiple staff / Civil Service fast streamer / Similar devices) []  - 0 Simple Staple / Suture removal (25 or less) []  - 0 Complex Staple / Suture removal (26 or more) []  - 0 Hypo / Hyperglycemic Management (close monitor of Blood Glucose) []  - 0 Ankle / Brachial Index (ABI) - do not check if billed separately X- 1 5 Vital Signs Has the patient been seen at the hospital within the last three years: Yes Total Score: 180 Level Of Care: New/Established - Level 5 Electronic Signature(s) Signed: 11/05/2021 5:14:24 PM By: [name] RN, BSN Entered By: [name] on 11/05/2021 12:13:06 -------------------------------------------------------------------------------- Lower Extremity Assessment Details [name], [name] 11/05/2021 10:00 A M Medical Record Number: [id number] Patient Account Number: [id number] Date of Birth/Sex: Treating RN: 1956/06/24 (66 y.o. [name] Primary  Care Camisha Srey: [name] Other Clinician: Referring Aurorah Schlachter: Treating Mialee Weyman/Extender: [name] in Treatment: 4 Edema Assessment Assessed: [Left: No] [Right: No] Edema: [Left: Yes] [Right: Yes] Calf Left: Right: Point of Measurement: 37 cm From Medial Instep 37.3 cm 33 cm Ankle Left: Right: Point of Measurement: 12 cm From Medial Instep 25 cm 24.5 cm Vascular Assessment Pulses: Dorsalis Pedis Palpable: [Left:Yes] [Right:Yes] Electronic Signature(s) Signed: 11/05/2021 5:14:24 PM By: [name] RN, BSN Entered By: [name] on 11/05/2021 10:38:47 -------------------------------------------------------------------------------- Multi Wound Chart Details [name], [name] 11/05/2021 10:00 A M Medical Record Number: [id number] Patient Account Number: [id number] Date of Birth/Sex: Treating RN: 08/11/1956 (66 y.o. [name] Primary Care Jennifr Gaeta: [name] Other Clinician: Referring Halston Kintz: Treating Takoda Janowiak/Extender: [name] in Treatment: 4 Vital Signs Height(in): 77 Pulse(bpm): 100 Weight(lbs): 258 Blood Pressure(mmHg): 98/62 Body Mass Index(BMI): 30.6 Temperature(F): 99.1 Respiratory Rate(breaths/min): 18 Photos: Right, Medial Lower Leg Left, Proximal, Posterior Lower Leg Left, Posterior Lower Leg Wound Location: Gradually Appeared Gradually Appeared Gradually Appeared Wounding Event: Venous Leg Ulcer Venous Leg Ulcer Venous Leg Ulcer Primary Etiology: Anemia, Sleep Apnea, Congestive Anemia, Sleep Apnea, Congestive Anemia, Sleep Apnea, Congestive Comorbid History: Heart Failure, Coronary Artery Heart Failure, Coronary Artery Heart Failure, Coronary Artery Disease, Hypertension, Peripheral Disease, Hypertension, Peripheral Disease, Hypertension, Peripheral Venous Disease, Type II Diabetes, Venous Disease, Type II Diabetes, Venous Disease, Type II  Diabetes, Osteoarthritis, Neuropathy Osteoarthritis, Neuropathy Osteoarthritis, Neuropathy 09/07/2021 09/07/2021 09/07/2021 Date Acquired: 4 4 4  Weeks of Treatment: Open Open Open Wound Status: No No No Wound Recurrence: 5.6x2.8x0.2 6x4.5x0.1 19x15.6x0.6 Measurements L x W x D (cm) 12.315 21.206 232.792 A (cm) : rea 2.463 2.121 139.675 Volume (cm) : -2.50% 73.30% 10.20% % Reduction in Area: 31.70% 91.10% -79.60% % Reduction in Volume: Full Thickness Without Exposed  Full Thickness Without Exposed Full Thickness Without Exposed Classification: Support Structures Support Structures Support Structures Surveyor, minerals Exudate Amount: Serosanguineous Sanguinous Sanguinous Exudate Type: red, brown red red Exudate Color: Thickened Distinct, outline attached Distinct, outline attached Wound Margin: Large (67-100%) Large (67-100%) Medium (34-66%) Granulation Amount: Red Red Red, Friable Granulation Quality: Small (1-33%) None Present (0%) Medium (34-66%) Necrotic Amount: Fat Layer (Subcutaneous Tissue): Yes Fat Layer (Subcutaneous Tissue): Yes Fat Layer (Subcutaneous Tissue): Yes Exposed Structures: Fascia: No Fascia: No Fascia: No Tendon: No Tendon: No Tendon: No Muscle: No Muscle: No Muscle: No Joint: No Joint: No Joint: No Bone: No Bone: No Bone: No Small (1-33%) Small (1-33%) Small (1-33%) Epithelialization: Wound Number: 28 N/A N/A Photos: N/A N/A Right, Lateral Ankle N/A N/A Wound Location: Gradually Appeared N/A N/A Wounding Event: Diabetic Wound/Ulcer of the Lower N/A N/A Primary Etiology: Extremity Anemia, Sleep Apnea, Congestive N/A N/A Comorbid History: Heart Failure, Coronary Artery Disease, Hypertension, Peripheral Venous Disease, Type II Diabetes, Osteoarthritis, Neuropathy 11/05/2021 N/A N/A Date Acquired: 0 N/A N/A Weeks of Treatment: Open N/A N/A Wound Status: No N/A N/A Wound Recurrence: 6x0.8x0.1 N/A N/A Measurements L x W x  D (cm) 3.77 N/A N/A A (cm) : rea 0.377 N/A N/A Volume (cm) : 0.00% N/A N/A % Reduction in A rea: 0.00% N/A N/A % Reduction in Volume: Grade 1 N/A N/A Classification: Medium N/A N/A Exudate A mount: Serosanguineous N/A N/A Exudate Type: red, brown N/A N/A Exudate Color: Flat and Intact N/A N/A Wound Margin: Large (67-100%) N/A N/A Granulation Amount: Red N/A N/A Granulation Quality: Small (1-33%) N/A N/A Necrotic Amount: Fat Layer (Subcutaneous Tissue): Yes N/A N/A Exposed Structures: Fascia: No Tendon: No Muscle: No Joint: No Bone: No Small (1-33%) N/A N/A Epithelialization: Treatment Notes Electronic Signature(s) Signed: 11/05/2021 12:26:13 PM By: [name] DO Signed: 11/05/2021 5:14:24 PM By: [name] RN, BSN Entered By: [name] on 11/05/2021 12:15:20 -------------------------------------------------------------------------------- Multi-Disciplinary Care Plan Details [name], [name] 11/05/2021 10:00 A M Medical Record Number: [id number] Patient Account Number: [id number] Date of Birth/Sex: Treating RN: 01/12/1956 (66 y.o. [name] Primary Care Tykee Heideman: [name] Other Clinician: Referring Izaya Netherton: Treating Mccall Lomax/Extender: [name] in Treatment: 4 Multidisciplinary Care Plan reviewed with physician Active Inactive Venous Leg Ulcer Nursing Diagnoses: Actual venous Insuffiency (use after diagnosis is confirmed) Goals: Patient will maintain optimal edema control Date Initiated: 10/08/2021 Target Resolution Date: 12/03/2021 Goal Status: Active Interventions: Compression as ordered Provide education on venous insufficiency Treatment Activities: Therapeutic compression applied : 10/08/2021 Notes: Wound/Skin Impairment Nursing Diagnoses: Impaired tissue integrity Goals: Patient/caregiver will verbalize understanding of skin care regimen Date Initiated:  10/08/2021 Target Resolution Date: 12/03/2021 Goal Status: Active Ulcer/skin breakdown will have a volume reduction of 30% by week 4 Date Initiated: 10/08/2021 Date Inactivated: 11/05/2021 Target Resolution Date: 11/05/2021 Goal Status: Unmet Unmet Reason: infection Ulcer/skin breakdown will have a volume reduction of 50% by week 8 Date Initiated: 11/05/2021 Target Resolution Date: 12/03/2021 Goal Status: Active Interventions: Assess patient/caregiver ability to perform ulcer/skin care regimen upon admission and as needed Assess ulceration(s) every visit Provide education on ulcer and skin care Treatment Activities: Topical wound management initiated : 10/08/2021 Notes: Electronic Signature(s) Signed: 11/05/2021 5:14:24 PM By: [name] RN, BSN Entered By: [name] on 11/05/2021 10:57:13 -------------------------------------------------------------------------------- Pain Assessment Details [name], [name] 11/05/2021 10:00 [place] Record Number: [id number] Patient Account Number: [id number] Date of Birth/Sex: Treating RN: Feb 06, 1956 (65  y.o. [name] Primary Care Markham Dumlao: [name] Other Clinician: Referring Rini Moffit: Treating Zein Helbing/Extender: [name] in Treatment: 4 Active Problems Location of Pain Severity and Description of Pain Patient Has Paino Yes Site Locations Pain Location: Generalized Pain, Pain in Ulcers With Dressing Change: Yes Duration of the Pain. Constant / Intermittento Constant Rate the pain. Current Pain Level: 8 Worst Pain Level: 10 Least Pain Level: 7 Character of Pain Describe the Pain: Aching Pain Management and Medication Current Pain Management: Medication: Yes Is the Current Pain Management Adequate: Adequate How does your wound impact your activities of daily livingo Sleep: Yes Bathing: No Appetite: No Relationship With Others: No Bladder Continence:  No Emotions: No Bowel Continence: No Hobbies: No Toileting: No Dressing: No Notes reports chronic left hip pain Electronic Signature(s) Signed: 11/05/2021 5:14:24 PM By: [name] RN, BSN Entered By: [name] on 11/05/2021 10:30:54 -------------------------------------------------------------------------------- Patient/Caregiver Education Details Patient Name: Date of Service: [name] RD, [name] 2/13/2023andnbsp10:00 A M Medical Record Number: [id number] Patient Account Number: [id number] Date of Birth/Gender: Treating RN: 09-Nov-1955 (66 y.o. [name] Primary Care Physician: [name] Other Clinician: Referring Physician: Treating Physician/Extender: [name] in Treatment: 4 Education Assessment Education Provided To: Patient Education Topics Provided Venous: Methods: Explain/Verbal Responses: Reinforcements needed, State content correctly Wound/Skin Impairment: Methods: Explain/Verbal Responses: Reinforcements needed, State content correctly Electronic Signature(s) Signed: 11/05/2021 5:14:24 PM By: [name] RN, BSN Entered By: [name] on 11/05/2021 10:57:45 -------------------------------------------------------------------------------- Wound Assessment Details [name], [name] 11/05/2021 10:00 A M Medical Record Number: [id number] Patient Account Number: [id number] Date of Birth/Sex: Treating RN: [birth year] (66 y.o. [name] Primary Care Aryel Edelen: [name] Other Clinician: Referring Amare Bail: Treating Prajwal Fellner/Extender: [name] in Treatment: 4 Wound Status Wound Number: 25 Primary Venous Leg Ulcer Etiology: Wound Location: Right, Medial Lower Leg Wound Open Wounding Event: Gradually Appeared Status: Date Acquired: 09/07/2021 Comorbid Anemia, Sleep Apnea, Congestive Heart Failure, Coronary Artery Weeks Of Treatment:  4 History: Disease, Hypertension, Peripheral Venous Disease, Type II Clustered Wound: No Diabetes, Osteoarthritis, Neuropathy Photos Wound Measurements Length: (cm) 5.6 Width: (cm) 2.8 Depth: (cm) 0.2 Area: (cm) 12.315 Volume: (cm) 2.463 % Reduction in Area: -2.5% % Reduction in Volume: 31.7% Epithelialization: Small (1-33%) Tunneling: No Undermining: No Wound Description Classification: Full Thickness Without Exposed Support Structures Wound Margin: Thickened Exudate Amount: Large Exudate Type: Serosanguineous Exudate Color: red, brown Foul Odor After Cleansing: No Slough/Fibrino Yes Wound Bed Granulation Amount: Large (67-100%) Exposed Structure Granulation Quality: Red Fascia Exposed: No Necrotic Amount: Small (1-33%) Fat Layer (Subcutaneous Tissue) Exposed: Yes Necrotic Quality: Adherent Slough Tendon Exposed: No Muscle Exposed: No Joint Exposed: No Bone Exposed: No Electronic Signature(s) Signed: 11/05/2021 5:14:24 PM By: [name] RN, BSN Entered By: [name] on 11/05/2021 10:39:27 -------------------------------------------------------------------------------- Wound Assessment Details [name], [name] 11/05/2021 10:00 A M Medical Record Number: [id number] Patient Account Number: [id number] Date of Birth/Sex: Treating RN: [birth year] (66 y.o. [name] Primary Care [name]: [name] Other Clinician: Referring Corydon Schweiss: Treating Lulla Linville/Extender: [name] in Treatment: 4 Wound Status Wound Number: 26 Primary Venous Leg Ulcer Etiology: Wound Location: Left, Proximal, Posterior Lower Leg Wound Open Wounding Event: Gradually Appeared Status: Date Acquired: 09/07/2021 Comorbid Anemia, Sleep Apnea, Congestive Heart Failure, Coronary Artery Weeks Of Treatment: 4 History: Disease, Hypertension, Peripheral Venous Disease, Type II Clustered Wound: No Diabetes,  Osteoarthritis, Neuropathy Photos Wound Measurements Length: (  cm) 6 Width: (cm) 4.5 Depth: (cm) 0.1 Area: (cm) 21.206 Volume: (cm) 2.121 % Reduction in Area: 73.3% % Reduction in Volume: 91.1% Epithelialization: Small (1-33%) Tunneling: No Undermining: No Wound Description Classification: Full Thickness Without Exposed Support Structures Wound Margin: Distinct, outline attached Exudate Amount: Large Exudate Type: Sanguinous Exudate Color: red Foul Odor After Cleansing: No Slough/Fibrino No Wound Bed Granulation Amount: Large (67-100%) Exposed Structure Granulation Quality: Red Fascia Exposed: No Necrotic Amount: None Present (0%) Fat Layer (Subcutaneous Tissue) Exposed: Yes Tendon Exposed: No Muscle Exposed: No Joint Exposed: No Bone Exposed: No Electronic Signature(s) Signed: 11/05/2021 5:14:24 PM By: [name] RN, BSN Entered By: [name] on 11/05/2021 10:54:04 -------------------------------------------------------------------------------- Wound Assessment Details [name], [name] 11/05/2021 10:00 A M Medical Record Number: [id number] Patient Account Number: [id number] Date of Birth/Sex: Treating RN: 12/17/1955 (66 y.o. [name] Primary Care Smera Guyette: [name] Other Clinician: Referring Jakeria Caissie: Treating Udell Mazzocco/Extender: [name] in Treatment: 4 Wound Status Wound Number: 27 Primary Venous Leg Ulcer Etiology: Wound Location: Left, Posterior Lower Leg Wound Open Wounding Event: Gradually Appeared Status: Date Acquired: 09/07/2021 Comorbid Anemia, Sleep Apnea, Congestive Heart Failure, Coronary Artery Weeks Of Treatment: 4 History: Disease, Hypertension, Peripheral Venous Disease, Type II Clustered Wound: No Diabetes, Osteoarthritis, Neuropathy Photos Wound Measurements Length: (cm) 19 Width: (cm) 15.6 Depth: (cm) 0.6 Area: (cm) 232.792 Volume: (cm) 139.675 %  Reduction in Area: 10.2% % Reduction in Volume: -79.6% Epithelialization: Small (1-33%) Tunneling: No Undermining: No Wound Description Classification: Full Thickness Without Exposed Support Structures Wound Margin: Distinct, outline attached Exudate Amount: Large Exudate Type: Sanguinous Exudate Color: red Foul Odor After Cleansing: No Slough/Fibrino Yes Wound Bed Granulation Amount: Medium (34-66%) Exposed Structure Granulation Quality: Red, Friable Fascia Exposed: No Necrotic Amount: Medium (34-66%) Fat Layer (Subcutaneous Tissue) Exposed: Yes Necrotic Quality: Adherent Slough Tendon Exposed: No Muscle Exposed: No Joint Exposed: No Bone Exposed: No Electronic Signature(s) Signed: 11/05/2021 5:14:24 PM By: [name] RN, BSN Entered By: [name] on 11/05/2021 10:54:48 -------------------------------------------------------------------------------- Wound Assessment Details [name], [name] 11/05/2021 10:00 A M Medical Record Number: [id number] Patient Account Number: [id number] Date of Birth/Sex: Treating RN: 10/22/1955 (66 y.o. [name] Primary Care Adwoa Axe: [name] Other Clinician: Referring Hina Gupta: Treating Dell Briner/Extender: [name] in Treatment: 4 Wound Status Wound Number: 28 Primary Diabetic Wound/Ulcer of the Lower Extremity Etiology: Wound Location: Right, Lateral Ankle Wound Open Wounding Event: Gradually Appeared Status: Date Acquired: 11/05/2021 Comorbid Anemia, Sleep Apnea, Congestive Heart Failure, Coronary Artery Weeks Of Treatment: 0 History: Disease, Hypertension, Peripheral Venous Disease, Type II Clustered Wound: No Diabetes, Osteoarthritis, Neuropathy Photos Wound Measurements Length: (cm) 6 Width: (cm) 0.8 Depth: (cm) 0.1 Area: (cm) 3.77 Volume: (cm) 0.377 % Reduction in Area: 0% % Reduction in Volume: 0% Epithelialization: Small  (1-33%) Tunneling: No Undermining: No Wound Description Classification: Grade 1 Wound Margin: Flat and Intact Exudate Amount: Medium Exudate Type: Serosanguineous Exudate Color: red, brown Foul Odor After Cleansing: No Slough/Fibrino Yes Wound Bed Granulation Amount: Large (67-100%) Exposed Structure Granulation Quality: Red Fascia Exposed: No Necrotic Amount: Small (1-33%) Fat Layer (Subcutaneous Tissue) Exposed: Yes Necrotic Quality: Adherent Slough Tendon Exposed: No Muscle Exposed: No Joint Exposed: No Bone Exposed: No Electronic Signature(s) Signed: 11/05/2021 5:14:24 PM By: [name] RN, BSN Entered By: [name] on 11/05/2021 10:40:47 -------------------------------------------------------------------------------- Vitals Details [name], [name] 11/05/2021 10:00 A M Medical Record Number: [id number]  Patient Account Number: [id number] Date of Birth/Sex: Treating RN: 12-05-1955 (66 y.o. [name] Primary Care Latha Staunton: [name] Other Clinician: Referring Delawrence Fridman: Treating Ethaniel Garfield/Extender: [name] in Treatment: 4 Vital Signs Time Taken: 10:16 Temperature (F): 99.1 Height (in): 77 Pulse (bpm): 82 Source: Stated Respiratory Rate (breaths/min): 18 Weight (lbs): 258 Blood Pressure (mmHg): 98/62 Source: Stated Reference Range: 80 - 120 mg / dl Body Mass Index (BMI): 30.6 Electronic Signature(s) Signed: 11/05/2021 5:14:24 PM By: [name] RN, BSN Entered By: [name] on 11/05/2021 10:17:26

## 2021-11-12 ENCOUNTER — Encounter (HOSPITAL_BASED_OUTPATIENT_CLINIC_OR_DEPARTMENT_OTHER): Payer: Medicare Other | Admitting: Internal Medicine

## 2021-11-13 ENCOUNTER — Other Ambulatory Visit: Payer: Self-pay

## 2021-11-13 ENCOUNTER — Encounter (HOSPITAL_BASED_OUTPATIENT_CLINIC_OR_DEPARTMENT_OTHER): Payer: Medicare Other | Admitting: Internal Medicine

## 2021-11-13 DIAGNOSIS — I87333 Chronic venous hypertension (idiopathic) with ulcer and inflammation of bilateral lower extremity: Secondary | ICD-10-CM

## 2021-11-13 DIAGNOSIS — L97912 Non-pressure chronic ulcer of unspecified part of right lower leg with fat layer exposed: Secondary | ICD-10-CM

## 2021-11-13 DIAGNOSIS — I890 Lymphedema, not elsewhere classified: Secondary | ICD-10-CM | POA: Diagnosis not present

## 2021-11-13 DIAGNOSIS — L97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ed: Secondary | ICD-10-CM

## 2021-11-13 DIAGNOSIS — E11622 Type 2 diabetes mellitus with other skin ulcer: Secondary | ICD-10-CM | POA: Diagnosis not present

## 2021-11-14 NOTE — Progress Notes (Signed)
Virts, Tim ([id number]) Visit Report for 11/13/2021 Arrival Information Details [name], [name] 11/13/2021 9:30 A M Medical Record Number: [id number] Patient Account Number: [id number] Date of Birth/Sex: Treating RN: 1955-10-05 (66 y.o. [name], Lauren Primary Care Brentyn Seehafer: [name] Other Clinician: Referring Nalaysia Manganiello: Treating Faylynn Stamos/Extender: [name] in Treatment: 5 Visit Information History Since Last Visit Added or deleted any medications: No Patient Arrived: Wheel Chair Any new allergies or adverse reactions: No Arrival Time: 09:38 Had a fall or experienced change in No Accompanied By: cna activities of daily living that may affect Transfer Assistance: Harrel Lemon Lift risk of falls: Patient Identification Verified: Yes Signs or symptoms of abuse/neglect since last visito No Secondary Verification Process Completed: Yes Hospitalized since last visit: No Patient Requires Transmission-Based No Implantable device outside of the clinic excluding No Precautions: cellular tissue based products placed in the center Patient Has Alerts: Yes since last visit: Patient Alerts: Patient on Blood Thinner Has Dressing in Place as Prescribed: Yes *NO BLOOD Pain Present Now: Yes PRODUCTS* Electronic Signature(s) Signed: 11/14/2021 5:31:46 PM By: Rhae Hammock RN Entered By: Rhae Hammock on 11/13/2021 09:39:09 -------------------------------------------------------------------------------- [name], [name] 11/13/2021 9:30 A M Medical Record Number: [id number] Patient Account Number: [id number] Date of Birth/Sex: Treating RN: Sep 23, 1956 (66 y.o. [name], Edgar Primary Care Montserrath Madding: [name] Other Clinician: Referring Keaton Stirewalt: Treating Kathaleen Dudziak/Extender: [name] in Treatment: 5 Clinic Level of Care  Assessment Items TOOL 4 Quantity Score X- 1 0 Use when only an EandM is performed on FOLLOW-UP visit ASSESSMENTS - Nursing Assessment / Reassessment X- 1 10 Reassessment of Co-morbidities (includes updates in patient status) X- 1 5 Reassessment of Adherence to Treatment Plan ASSESSMENTS - Wound and Skin A ssessment / Reassessment []  - 0 Simple Wound Assessment / Reassessment - one wound X- 3 5 Complex Wound Assessment / Reassessment - multiple wounds []  - 0 Dermatologic / Skin Assessment (not related to wound area) ASSESSMENTS - Focused Assessment X- 2 5 Circumferential Edema Measurements - multi extremities []  - 0 Nutritional Assessment / Counseling / Intervention []  - 0 Lower Extremity Assessment (monofilament, tuning fork, pulses) []  - 0 Peripheral Arterial Disease Assessment (using hand held doppler) ASSESSMENTS - Ostomy and/or Continence Assessment and Care []  - 0 Incontinence Assessment and Management []  - 0 Ostomy Care Assessment and Management (repouching, etc.) PROCESS - Coordination of Care []  - 0 Simple Patient / Family Education for ongoing care X- 1 20 Complex (extensive) Patient / Family Education for ongoing care X- 1 10 Staff obtains Programmer, systems, Records, T Results / Process Orders est X- 1 10 Staff telephones HHA, Nursing Homes / Clarify orders / etc []  - 0 Routine Transfer to another Facility (non-emergent condition) []  - 0 Routine Hospital Admission (non-emergent condition) []  - 0 New Admissions / Biomedical engineer / Ordering NPWT Apligraf, etc. , []  - 0 Emergency Hospital Admission (emergent condition) []  - 0 Simple Discharge Coordination X- 1 15 Complex (extensive) Discharge Coordination PROCESS - Special Needs []  - 0 Pediatric / Minor Patient Management []  - 0 Isolation Patient Management []  - 0 Hearing / Language / Visual special needs []  - 0 Assessment of Community assistance (transportation, D/C planning, etc.) []  -  0 Additional assistance / Altered mentation []  - 0 Support Surface(s) Assessment (bed, cushion, seat, etc.) INTERVENTIONS - Wound Cleansing / Measurement []  - 0 Simple Wound Cleansing - one wound X-  3 5 Complex Wound Cleansing - multiple wounds X- 1 5 Wound Imaging (photographs - any number of wounds) []  - 0 Wound Tracing (instead of photographs) []  - 0 Simple Wound Measurement - one wound X- 3 5 Complex Wound Measurement - multiple wounds INTERVENTIONS - Wound Dressings []  - 0 Small Wound Dressing one or multiple wounds []  - 0 Medium Wound Dressing one or multiple wounds X- 3 20 Large Wound Dressing one or multiple wounds []  - 0 Application of Medications - topical []  - 0 Application of Medications - injection INTERVENTIONS - Miscellaneous []  - 0 External ear exam []  - 0 Specimen Collection (cultures, biopsies, blood, body fluids, etc.) []  - 0 Specimen(s) / Culture(s) sent or taken to Lab for analysis []  - 0 Patient Transfer (multiple staff / Civil Service fast streamer / Similar devices) []  - 0 Simple Staple / Suture removal (25 or less) []  - 0 Complex Staple / Suture removal (26 or more) []  - 0 Hypo / Hyperglycemic Management (close monitor of Blood Glucose) []  - 0 Ankle / Brachial Index (ABI) - do not check if billed separately X- 1 5 Vital Signs Has the patient been seen at the hospital within the last three years: Yes Total Score: 195 Level Of Care: New/Established - Level 5 Electronic Signature(s) Signed: 11/14/2021 5:31:46 PM By: Rhae Hammock RN Entered By: Rhae Hammock on 11/13/2021 16:52:12 -------------------------------------------------------------------------------- Encounter Discharge Information Details [name], [name] 11/13/2021 9:30 A M Medical Record Number: [id number] Patient Account Number: [id number] Date of Birth/Sex: Treating RN: November 25, 1955 (66 y.o. [name] Primary Care Herny Scurlock: [name] Other  Clinician: Referring Kenlie Seki: Treating Sharlyne Koeneman/Extender: [name] in Treatment: 5 Encounter Discharge Information Items Discharge Condition: Stable Ambulatory Status: Wheelchair Discharge Destination: Skilled Nursing Facility Orders Sent: Yes Transportation: Private Auto Schedule Follow-up Appointment: Yes Clinical Summary of Care: Patient Declined Electronic Signature(s) Signed: 11/14/2021 5:31:46 PM By: Rhae Hammock RN Entered By: Rhae Hammock on 11/13/2021 16:55:53 -------------------------------------------------------------------------------- Lower Extremity Assessment Details [name], [name] 11/13/2021 9:30 A M Medical Record Number: [id number] Patient Account Number: [id number] Date of Birth/Sex: Treating RN: 19-Aug-1956 (66 y.o. [name] Primary Care Alexande Sheerin: [name] Other Clinician: Referring Jumanah Hynson: Treating Liboria Putnam/Extender: [name] in Treatment: 5 Edema Assessment Assessed: Shirlyn Goltz: Yes] Patrice Paradise: Yes] Edema: [Left: Yes] [Right: Yes] Calf Left: Right: Point of Measurement: 37 cm From Medial Instep 37.3 cm 33 cm Ankle Left: Right: Point of Measurement: 12 cm From Medial Instep 25 cm 24.5 cm Vascular Assessment Pulses: Dorsalis Pedis Palpable: [Left:Yes] [Right:Yes] Posterior Tibial Palpable: [Left:Yes] [Right:Yes] Electronic Signature(s) Signed: 11/14/2021 5:31:46 PM By: Rhae Hammock RN Entered By: Rhae Hammock on 11/13/2021 10:04:55 -------------------------------------------------------------------------------- Multi Wound Chart Details [name], [name] 11/13/2021 9:30 A M Medical Record Number: [id number] Patient Account Number: [id number] Date of Birth/Sex: Treating RN: [birth year] (66 y.o. M) Primary Care Fumi Guadron: [name] Other Clinician: Referring Sandy Haye: Treating Deiona Hooper/Extender:  [name] in Treatment: 5 Vital Signs Height(in): 56 Pulse(bpm): 90 Weight(lbs): 258 Blood Pressure(mmHg): 119/71 Body Mass Index(BMI): 30.6 Temperature(F): 99.7 Respiratory Rate(breaths/min): 17 Photos: [25:No Photos] Right, Medial Lower Leg Right, Medial Lower Leg Left, Proximal, Posterior Lower Leg Wound Location: Gradually Appeared Gradually Appeared Gradually Appeared Wounding Event: Venous Leg Ulcer Venous Leg Ulcer Venous Leg Ulcer Primary Etiology: N/A N/A N/A Secondary Etiology: Anemia, Sleep Apnea, Congestive Anemia, Sleep Apnea, Congestive Anemia, Sleep Apnea, Congestive Comorbid History:  Heart Failure, Coronary Artery Heart Failure, Coronary Artery Heart Failure, Coronary Artery Disease, Hypertension, Peripheral Disease, Hypertension, Peripheral Disease, Hypertension, Peripheral Venous Disease, Type II Diabetes, Venous Disease, Type II Diabetes, Venous Disease, Type II Diabetes, Osteoarthritis, Neuropathy Osteoarthritis, Neuropathy Osteoarthritis, Neuropathy 09/07/2021 09/07/2021 09/07/2021 Date Acquired: 5 5 5  Weeks of Treatment: Open Open Open Wound Status: No No No Wound Recurrence: 5.5x4.5x0.2 5.5x4.5x0.2 5x5x0.1 Measurements L x W x D (cm) 19.439 19.439 19.635 A (cm) : rea 3.888 3.888 1.963 Volume (cm) : -61.80% -61.80% 75.30% % Reduction in Area: -7.90% -7.90% 91.80% % Reduction in Volume: Full Thickness Without Exposed Full Thickness Without Exposed Full Thickness Without Exposed Classification: Support Structures Support Structures Support Structures Surveyor, minerals Exudate A mount: Serosanguineous Serosanguineous Purulent Exudate Type: red, brown red, brown yellow, brown, green Exudate Color: No N/A Yes Foul Odor A Cleansing: fter N/A N/A No Odor Anticipated Due to Product Use: Thickened N/A Distinct, outline attached Wound Margin: Large (67-100%) N/A Large (67-100%) Granulation A mount: Red N/A  Red, Pink Granulation Quality: Small (1-33%) N/A Small (1-33%) Necrotic Amount: Adherent Slough N/A Adherent Slough Necrotic Tissue: Fat Layer (Subcutaneous Tissue): Yes N/A Fat Layer (Subcutaneous Tissue): Yes Exposed Structures: Fascia: No Fascia: No Tendon: No Tendon: No Muscle: No Muscle: No Joint: No Joint: No Bone: No Bone: No Small (1-33%) N/A Small (1-33%) Epithelialization: Wound Number: [id number] N/A Photos: N/A Left, Posterior Lower Leg Right, Lateral Ankle N/A Wound Location: Gradually Appeared Gradually Appeared N/A Wounding Event: 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/A Primary Etiology: Extremity N/A Venous Leg Ulcer N/A Secondary Etiology: Anemia, Sleep Apnea, Congestive Anemia, Sleep Apnea, Congestive N/A Comorbid History: Heart Failure, Coronary Artery Heart Failure, Coronary Artery Disease, Hypertension, Peripheral Disease, Hypertension, Peripheral Venous Disease, Type II Diabetes, Venous Disease, Type II Diabetes, Osteoarthritis, Neuropathy Osteoarthritis, Neuropathy 09/07/2021 11/05/2021 N/A Date Acquired: 5 1 N/A Weeks of Treatment: Open Open N/A Wound Status: No No N/A Wound Recurrence: 22x17x0.6 5.6x1x0.1 N/A Measurements L x W x D (cm) 293.739 4.398 N/A A (cm) : rea 176.243 0.44 N/A Volume (cm) : -13.30% -16.70% N/A % Reduction in Area: -126.70% -16.70% N/A % Reduction in Volume: Full Thickness Without Exposed Grade 1 N/A Classification: Support Structures Large Medium N/A Exudate A mount: Purulent Serosanguineous N/A Exudate Type: yellow, brown, green red, brown N/A Exudate Color: No No N/A Foul Odor A Cleansing: fter N/A N/A N/A Odor Anticipated Due to Product Use: Distinct, outline attached Flat and Intact N/A Wound Margin: Small (1-33%) Large (67-100%) N/A Granulation A mount: Red, Friable Red N/A Granulation Quality: Large (67-100%) Small (1-33%) N/A Necrotic Amount: Eschar, Adherent Slough  Adherent Slough N/A Necrotic Tissue: Fat Layer (Subcutaneous Tissue): Yes Fat Layer (Subcutaneous Tissue): Yes N/A Exposed Structures: Fascia: No Fascia: No Tendon: No Tendon: No Muscle: No Muscle: No Joint: No Joint: No Bone: No Bone: No Small (1-33%) Small (1-33%) N/A Epithelialization: Treatment Notes Electronic Signature(s) Signed: 11/13/2021 11:29:48 AM By: [name] DO Entered By: [name] on 11/13/2021 11:13:54 -------------------------------------------------------------------------------- Multi-Disciplinary Care Plan Details [name], [name] 11/13/2021 9:30 A M Medical Record Number: [id number] Patient Account Number: [id number] Date of Birth/Sex: Treating RN: 1956/02/03 (66 y.o. [name] Primary Care Shaunika Italiano: [name] Other Clinician: Referring Eura Mccauslin: Treating Daveah Varone/Extender: [name] in Treatment: 5 Multidisciplinary Care Plan reviewed with physician Active Inactive Venous Leg Ulcer Nursing Diagnoses: Actual venous Insuffiency (use after diagnosis is confirmed) Goals: Patient will maintain optimal edema control Date Initiated:  10/08/2021 Target Resolution Date: 12/03/2021 Goal Status: Active Interventions: Compression as ordered Provide education on venous insufficiency Treatment Activities: Therapeutic compression applied : 10/08/2021 Notes: Wound/Skin Impairment Nursing Diagnoses: Impaired tissue integrity Goals: Patient/caregiver will verbalize understanding of skin care regimen Date Initiated: 10/08/2021 Target Resolution Date: 12/03/2021 Goal Status: Active Ulcer/skin breakdown will have a volume reduction of 30% by week 4 Date Initiated: 10/08/2021 Date Inactivated: 11/05/2021 Target Resolution Date: 11/05/2021 Goal Status: Unmet Unmet Reason: infection Ulcer/skin breakdown will have a volume reduction of 50% by week 8 Date Initiated:  11/05/2021 Target Resolution Date: 12/03/2021 Goal Status: Active Interventions: Assess patient/caregiver ability to perform ulcer/skin care regimen upon admission and as needed Assess ulceration(s) every visit Provide education on ulcer and skin care Treatment Activities: Topical wound management initiated : 10/08/2021 Notes: Electronic Signature(s) Signed: 11/14/2021 5:31:46 PM By: Rhae Hammock RN Entered By: Rhae Hammock on 11/13/2021 10:44:34 -------------------------------------------------------------------------------- Pain Assessment Details [name], [name] 11/13/2021 9:30 A M Medical Record Number: [id number] Patient Account Number: [id number] Date of Birth/Sex: Treating RN: Sep 06, 1956 (66 y.o. [name] Primary Care Monta Maiorana: [name] Other Clinician: Referring Brenna Friesenhahn: Treating Paulene Tayag/Extender: [name] in Treatment: 5 Active Problems Location of Pain Severity and Description of Pain Patient Has Paino Yes Site Locations Pain Location: Pain Location: Generalized Pain, Pain in Ulcers With Dressing Change: Yes Duration of the Pain. Constant / Intermittento Constant Rate the pain. Current Pain Level: 7 Worst Pain Level: 10 Least Pain Level: 0 Tolerable Pain Level: 7 Character of Pain Describe the Pain: Aching Pain Management and Medication Current Pain Management: Medication: No Cold Application: No Rest: No Massage: No Activity: No T.E.N.S.: No Heat Application: No Leg drop or elevation: No Is the Current Pain Management Adequate: Adequate How does your wound impact your activities of daily livingo Sleep: No Bathing: No Appetite: No Relationship With Others: No Bladder Continence: No Emotions: No Bowel Continence: No Work: No Toileting: No Drive: No Dressing: No Hobbies: No Electronic Signature(s) Signed: 11/14/2021 5:31:46 PM By: Rhae Hammock RN Entered  By: Rhae Hammock on 11/13/2021 09:39:47 -------------------------------------------------------------------------------- Patient/Caregiver Education Details Patient Name: Date of Service: Orma Flaming RD, [name] 2/21/2023andnbsp9:30 A M Medical Record Number: [id number] Patient Account Number: [id number] Date of Birth/Gender: Treating RN: [birth year] (66 y.o. [name] Primary Care Physician: [name] Other Clinician: Referring Physician: Treating Physician/Extender: [name] in Treatment: 5 Education Assessment Education Provided To: Patient and Caregiver Education Topics Provided Basic Hygiene: Methods: Explain/Verbal Responses: Reinforcements needed, State content correctly Electronic Signature(s) Signed: 11/14/2021 5:31:46 PM By: Rhae Hammock RN Entered By: Rhae Hammock on 11/13/2021 16:25:45 -------------------------------------------------------------------------------- Wound Assessment Details [name], [name] 11/13/2021 9:30 A M Medical Record Number: [id number] Patient Account Number: [id number] Date of Birth/Sex: Treating RN: 07-18-1956 (66 y.o. [name], Lauren Primary Care Eligha Kmetz: [name] Other Clinician: Referring Perlie Scheuring: Treating Gaspard Isbell/Extender: [name] in Treatment: 5 Wound Status Wound Number: 25 Primary Venous Leg Ulcer Etiology: Wound Location: Right, Medial Lower Leg Wound Open Wounding Event: Gradually Appeared Status: Date Acquired: 09/07/2021 Comorbid Anemia, Sleep Apnea, Congestive Heart Failure, Coronary Artery Weeks Of Treatment: 5 History: Disease, Hypertension, Peripheral Venous Disease, Type II Clustered Wound: No Diabetes, Osteoarthritis, Neuropathy Wound Measurements Length: (cm) 5.5 Width: (cm) 4.5 Depth: (cm) 0.2 Area: (cm) 19.439 Volume: (cm) 3.888 % Reduction in Area: -61.8% % Reduction in Volume:  -7.9% Epithelialization: Small (1-33%) Tunneling: No Undermining: No Wound Description Classification:  Full Thickness Without Exposed Support Structures Wound Margin: Thickened Exudate Amount: Large Exudate Type: Serosanguineous Exudate Color: red, brown Foul Odor After Cleansing: No Slough/Fibrino Yes Wound Bed Granulation Amount: Large (67-100%) Exposed Structure Granulation Quality: Red Fascia Exposed: No Necrotic Amount: Small (1-33%) Fat Layer (Subcutaneous Tissue) Exposed: Yes Necrotic Quality: Adherent Slough Tendon Exposed: No Muscle Exposed: No Joint Exposed: No Bone Exposed: No Electronic Signature(s) Signed: 11/14/2021 5:31:46 PM By: Rhae Hammock RN Entered By: Rhae Hammock on 11/13/2021 10:03:43 -------------------------------------------------------------------------------- Wound Assessment Details [name], [name] 11/13/2021 9:30 A M Medical Record Number: [id number] Patient Account Number: [id number] Date of Birth/Sex: Treating RN: [birth year] (66 y.o. [name], Lauren Primary Care Kendrick Haapala: [name] Other Clinician: Referring Bern Fare: Treating Sotiria Keast/Extender: [name] in Treatment: 5 Wound Status Wound Number: 25 Primary Venous Leg Ulcer Etiology: Etiology: Wound Location: Right, Medial Lower Leg Wound Open Wounding Event: Gradually Appeared Status: Date Acquired: 09/07/2021 Comorbid Anemia, Sleep Apnea, Congestive Heart Failure, Coronary Artery Weeks Of Treatment: 5 History: Disease, Hypertension, Peripheral Venous Disease, Type II Clustered Wound: No Diabetes, Osteoarthritis, Neuropathy Photos Wound Measurements Length: (cm) 5.5 Width: (cm) 4.5 Depth: (cm) 0.2 Area: (cm) 19.439 Volume: (cm) 3.888 % Reduction in Area: -61.8% % Reduction in Volume: -7.9% Wound Description Classification: Full Thickness Without Exposed Support Structur Exudate Amount:  Large Exudate Type: Serosanguineous Exudate Color: red, brown es Treatment Notes Wound #25 (Lower Leg) Wound Laterality: Right, Medial Cleanser Anasept Antimicrobial Skin and Wound Cleanser, 8 (oz) Discharge Instruction: Cleanse the wound with Anasept cleanser prior to applying a clean dressing using gauze sponges, not tissue or cotton balls. Peri-Wound Care Zinc Oxide Ointment 30g tube Discharge Instruction: Apply Zinc Oxide to periwound with each dressing change Sween Lotion (Moisturizing lotion) Discharge Instruction: Apply moisturizing lotion as directed Topical Gentamicin Discharge Instruction: Apply directly to wound bed, under silver alginate Primary Dressing KerraCel Ag Gelling Fiber Dressing, 4x5 in (silver alginate) Discharge Instruction: Apply silver alginate to wound bed as instructed Secondary Dressing ABD Pad, 8x10 Discharge Instruction: Apply over primary dressing as directed. Secured With Compression Wrap Kerlix Roll 4.5x3.1 (in/yd) Discharge Instruction: Apply Kerlix and Coban compression as directed. Coban Self-Adherent Wrap 4x5 (in/yd) Discharge Instruction: Apply over Kerlix as directed. Compression Stockings Add-Ons Electronic Signature(s) Signed: 11/13/2021 4:20:47 PM By: Sharyn Creamer RN, BSN Signed: 11/14/2021 5:31:46 PM By: Rhae Hammock RN Entered By: Sharyn Creamer on 11/13/2021 10:04:43 -------------------------------------------------------------------------------- Wound Assessment Details [name], [name] 11/13/2021 9:30 A M Medical Record Number: [id number] Patient Account Number: [id number] Date of Birth/Sex: Treating RN: 02/05/1956 (66 y.o. [name], Lauren Primary Care Jadence Kinlaw: [name] Other Clinician: Referring Farida Mcreynolds: Treating Jaidan Stachnik/Extender: [name] in Treatment: 5 Wound Status Wound Number: 26 Primary Venous Leg Ulcer Etiology: Wound Location: Left,  Proximal, Posterior Lower Leg Wound Open Wounding Event: Gradually Appeared Status: Date Acquired: 09/07/2021 Comorbid Anemia, Sleep Apnea, Congestive Heart Failure, Coronary Artery Weeks Of Treatment: 5 History: Disease, Hypertension, Peripheral Venous Disease, Type II Clustered Wound: No Diabetes, Osteoarthritis, Neuropathy Photos Wound Measurements Length: (cm) 5 Width: (cm) 5 Depth: (cm) 0.1 Area: (cm) 19.635 Volume: (cm) 1.963 % Reduction in Area: 75.3% % Reduction in Volume: 91.8% Epithelialization: Small (1-33%) Tunneling: No Undermining: No Wound Description Classification: Full Thickness Without Exposed Support Structures Wound Margin: Distinct, outline attached Exudate Amount: Large Exudate Type: Purulent Exudate Color: yellow, brown, green Foul Odor After Cleansing: Yes Due to Product Use: No Slough/Fibrino Yes Wound Bed Granulation  Amount: Large (67-100%) Exposed Structure Granulation Quality: Red, Pink Fascia Exposed: No Necrotic Amount: Small (1-33%) Fat Layer (Subcutaneous Tissue) Exposed: Yes Necrotic Quality: Adherent Slough Tendon Exposed: No Muscle Exposed: No Joint Exposed: No Bone Exposed: No Treatment Notes Wound #26 (Lower Leg) Wound Laterality: Left, Posterior, Proximal Cleanser Anasept Antimicrobial Skin and Wound Cleanser, 8 (oz) Discharge Instruction: Cleanse the wound with Anasept cleanser prior to applying a clean dressing using gauze sponges, not tissue or cotton balls. Peri-Wound Care Zinc Oxide Ointment 30g tube Discharge Instruction: Apply Zinc Oxide to periwound with each dressing change Sween Lotion (Moisturizing lotion) Discharge Instruction: Apply moisturizing lotion as directed Topical Gentamicin Discharge Instruction: Apply directly to wound bed, under silver alginate Primary Dressing KerraCel Ag Gelling Fiber Dressing, 4x5 in (silver alginate) Discharge Instruction: Apply silver alginate to wound bed as  instructed Secondary Dressing ABD Pad, 8x10 Discharge Instruction: Apply over primary dressing as directed. Zetuvit Plus 4x8 in Discharge Instruction: or other superabsorbant pad. Apply over primary dressing as directed. Secured With Compression Wrap Kerlix Roll 4.5x3.1 (in/yd) Discharge Instruction: Apply Kerlix and Coban compression as directed. Coban Self-Adherent Wrap 4x5 (in/yd) Discharge Instruction: Apply over Kerlix as directed. Compression Stockings Add-Ons Electronic Signature(s) Signed: 11/13/2021 4:20:47 PM By: Sharyn Creamer RN, BSN Signed: 11/14/2021 5:31:46 PM By: Rhae Hammock RN Entered By: Sharyn Creamer on 11/13/2021 10:02:44 -------------------------------------------------------------------------------- Wound Assessment Details [name], [name] 11/13/2021 9:30 A M Medical Record Number: [id number] Patient Account Number: [id number] Date of Birth/Sex: Treating RN: 26-Feb-1956 (66 y.o. [name], Lauren Primary Care Anilah Huck: [name] Other Clinician: Referring Shoni Quijas: Treating Ripley Lovecchio/Extender: [name] in Treatment: 5 Wound Status Wound Number: 27 Primary Venous Leg Ulcer Etiology: Wound Location: Left, Posterior Lower Leg Wound Open Wounding Event: Gradually Appeared Status: Date Acquired: 09/07/2021 Comorbid Anemia, Sleep Apnea, Congestive Heart Failure, Coronary Artery Weeks Of Treatment: 5 History: Disease, Hypertension, Peripheral Venous Disease, Type II Clustered Wound: No Diabetes, Osteoarthritis, Neuropathy Photos Wound Measurements Length: (cm) 22 Width: (cm) 17 Depth: (cm) 0.6 Area: (cm) 293.739 Volume: (cm) 176.243 % Reduction in Area: -13.3% % Reduction in Volume: -126.7% Epithelialization: Small (1-33%) Tunneling: No Undermining: No Wound Description Classification: Full Thickness Without Exposed Support Structures Wound Margin: Distinct, outline  attached Exudate Amount: Large Exudate Type: Purulent Exudate Color: yellow, brown, green Foul Odor After Cleansing: No Slough/Fibrino Yes Wound Bed Granulation Amount: Small (1-33%) Exposed Structure Granulation Quality: Red, Friable Fascia Exposed: No Necrotic Amount: Large (67-100%) Fat Layer (Subcutaneous Tissue) Exposed: Yes Necrotic Quality: Eschar, Adherent Slough Tendon Exposed: No Muscle Exposed: No Joint Exposed: No Bone Exposed: No Treatment Notes Wound #27 (Lower Leg) Wound Laterality: Left, Posterior Cleanser Anasept Antimicrobial Skin and Wound Cleanser, 8 (oz) Discharge Instruction: Cleanse the wound with Anasept cleanser prior to applying a clean dressing using gauze sponges, not tissue or cotton balls. Peri-Wound Care Zinc Oxide Ointment 30g tube Discharge Instruction: Apply Zinc Oxide to periwound with each dressing change Sween Lotion (Moisturizing lotion) Discharge Instruction: Apply moisturizing lotion as directed Topical Gentamicin Discharge Instruction: Apply directly to wound bed, under silver alginate Primary Dressing KerraCel Ag Gelling Fiber Dressing, 4x5 in (silver alginate) Discharge Instruction: Apply silver alginate to wound bed as instructed Secondary Dressing ABD Pad, 8x10 Discharge Instruction: Apply over primary dressing as directed. Zetuvit Plus 4x8 in Discharge Instruction: or other superabsorbant pad. Apply over primary dressing as directed. Secured With Compression Wrap Kerlix Roll 4.5x3.1 (in/yd) Discharge Instruction: Apply Kerlix and Coban compression as directed. Coban Self-Adherent Wrap  4x5 (in/yd) Discharge Instruction: Apply over Kerlix as directed. Compression Stockings Add-Ons Electronic Signature(s) Signed: 11/13/2021 4:20:47 PM By: Sharyn Creamer RN, BSN Signed: 11/14/2021 5:31:46 PM By: Rhae Hammock RN Entered By: Sharyn Creamer on 11/13/2021  10:03:32 -------------------------------------------------------------------------------- Wound Assessment Details [name], [name] 11/13/2021 9:30 A M Medical Record Number: [id number] Patient Account Number: [id number] Date of Birth/Sex: Treating RN: December 21, 1955 (66 y.o. [name], Lauren Primary Care Resha Filippone: [name] Other Clinician: Referring Nikoleta Dady: Treating Reginal Wojcicki/Extender: [name] in Treatment: 5 Wound Status Wound Number: 28 Primary Diabetic Wound/Ulcer of the Lower Extremity Etiology: Wound Location: Right, Lateral Ankle Secondary Venous Leg Ulcer Wounding Event: Gradually Appeared Etiology: Date Acquired: 11/05/2021 Wound Open Weeks Of Treatment: 1 Status: Clustered Wound: No Comorbid Anemia, Sleep Apnea, Congestive Heart Failure, Coronary Artery History: Disease, Hypertension, Peripheral Venous Disease, Type II Diabetes, Osteoarthritis, Neuropathy Photos Wound Measurements Length: (cm) 5.6 Width: (cm) 1 Depth: (cm) 0.1 Area: (cm) 4.398 Volume: (cm) 0.44 % Reduction in Area: -16.7% % Reduction in Volume: -16.7% Epithelialization: Small (1-33%) Tunneling: No Undermining: No Wound Description Classification: Grade 1 Wound Margin: Flat and Intact Exudate Amount: Medium Exudate Type: Serosanguineous Exudate Color: red, brown Foul Odor After Cleansing: No Slough/Fibrino Yes Wound Bed Granulation Amount: Large (67-100%) Exposed Structure Granulation Quality: Red Fascia Exposed: No Necrotic Amount: Small (1-33%) Fat Layer (Subcutaneous Tissue) Exposed: Yes Necrotic Quality: Adherent Slough Tendon Exposed: No Muscle Exposed: No Joint Exposed: No Bone Exposed: No Treatment Notes Wound #28 (Ankle) Wound Laterality: Right, Lateral Cleanser Anasept Antimicrobial Skin and Wound Cleanser, 8 (oz) Discharge Instruction: Cleanse the wound with Anasept cleanser prior to applying a clean  dressing using gauze sponges, not tissue or cotton balls. Peri-Wound Care Zinc Oxide Ointment 30g tube Discharge Instruction: Apply Zinc Oxide to periwound with each dressing change Sween Lotion (Moisturizing lotion) Discharge Instruction: Apply moisturizing lotion as directed Topical Gentamicin Discharge Instruction: Apply directly to wound bed, under silver alginate Primary Dressing KerraCel Ag Gelling Fiber Dressing, 4x5 in (silver alginate) Discharge Instruction: Apply silver alginate to wound bed as instructed Secondary Dressing ABD Pad, 8x10 Discharge Instruction: Apply over primary dressing as directed. Secured With Compression Wrap Kerlix Roll 4.5x3.1 (in/yd) Discharge Instruction: Apply Kerlix and Coban compression as directed. Coban Self-Adherent Wrap 4x5 (in/yd) Discharge Instruction: Apply over Kerlix as directed. Compression Stockings Add-Ons Electronic Signature(s) Signed: 11/13/2021 4:20:47 PM By: Sharyn Creamer RN, BSN Signed: 11/14/2021 5:31:46 PM By: Rhae Hammock RN Entered By: Sharyn Creamer on 11/13/2021 10:05:37 -------------------------------------------------------------------------------- Vitals Details [name], [name] 11/13/2021 9:30 A M Medical Record Number: [id number] Patient Account Number: [id number] Date of Birth/Sex: Treating RN: Oct 16, 1955 (66 y.o. [name], Lauren Primary Care Acadia Thammavong: [name] Other Clinician: Referring Ragan Reale: Treating Hermon Zea/Extender: [name] in Treatment: 5 Vital Signs Time Taken: 09:39 Temperature (F): 99.7 Height (in): 77 Pulse (bpm): 90 Weight (lbs): 258 Respiratory Rate (breaths/min): 17 Body Mass Index (BMI): 30.6 Blood Pressure (mmHg): 119/71 Reference Range: 80 - 120 mg / dl Electronic Signature(s) Signed: 11/14/2021 5:31:46 PM By: Rhae Hammock RN Entered By: Rhae Hammock on 11/13/2021 09:39:26

## 2021-11-15 NOTE — Progress Notes (Signed)
[name], [name] ([id number]) Visit Report for 11/13/2021 Chief Complaint Document Details Patient Name: Date of Service: [name], [name] 11/13/2021 9:30 [name] M Medical Record Number: [id number] Patient Account Number: [id number] Date of Birth/Sex: Treating RN: 08/22/1956 (66 y.o. M) Primary Care Provider: [name] Other Clinician: Referring Provider: Treating Provider/Extender: [name] in Treatment: 5 Information Obtained from: Patient Chief Complaint 10/08/2021; bilateral lower extremity wounds Electronic Signature(s) Signed: 11/13/2021 11:29:48 AM By: [name] DO Entered By: [name] on 11/13/2021 11:14:12 -------------------------------------------------------------------------------- HPI Details Patient Name: Date of Service: [name], [name] 11/13/2021 9:30 [name] M Medical Record Number: [id number] Patient Account Number: [id number] Date of Birth/Sex: Treating RN: 1956-06-16 (66 y.o. M) Primary Care Provider: [name] Other Clinician: Referring Provider: Treating Provider/Extender: [name] in Treatment: 5 History of Present Illness HPI Description: 07/07/15; this is [name] patient who is in hospital on 8/2 through 8/4. He had cellulitis and abscess of predominantly I think the left leg. He received IV antibiotics. Plain x-ray showed no osteomyelitis. An MRI of the left leg did not show osteomyelitis. Cultures showed no predominant organism. His hemoglobin A1c was 9.8. He has [name] history of venous stasis also peripheral vascular disease. He was discharged to [facility]. He really has extensive ulcerations on the left lateral leg including [name] major wound that communicates both posteriorly and superiorly w. When drainage coming out of 2 smaller areas. He has [name] smaller wound on the posterior medial left leg. He has more predominantly venous insufficiency wounds on predominantly the right medial leg above the  ankle the right foot into sections. He has recently been put on Augmentin at the nursing home. Previous ABIs/arterial evaluation showed triphasic waves diffusely he does not have [name] major ischemic issue [name] left lower extremity venous duplex also exam showed no evidence of [name] DVT on 6/21 07/21/15; the patient arrives with really no major change. Culture I did last time was negative. He has not had [name] follow-up MRI I ordered. The wounds are macerated covered with [name] thick gelatinous surface slough. There is necrotic subcutaneous tissue 08/04/15; the patient arrives with [name] considerable improvement in the majority of his wound area. The area on the left leg now has what looks to be [name] granulated base. Most of the wounds on the left leg required an aggressive surgical debridement to remove nonviable fibrinous eschar and subcutaneous tissue however after debridement most of this looks better. Although I had asked for repeat MRI of the left leg when he first came in here with grossly purulent material coming out of his wounds, it does not appear that this is been done and nor do I actually feel that strongly about it right now. He has severe surrounding venous insufficiency and inflammation. I don't believe he has significant PAD 08/25/15. In general there is still [name] considerable wound area here but with still extensive service adherent slough. The patient will not allow mechanical debridement due to pain. He did not tolerate Medihoney therefore we are left with Santyl for now. She would appear that he has severe surrounding venous stasis. There is no evidence of the infection that may have had something to do with the pathogenesis of these wounds 09/29/15; patient still has substantial wound area on the left leg with [name] cluster of several wounds on the lateral leg confluently on the left leg posteriorly and then [name] substantial wound on the medial leg. On the right leg [name] substantial wound medially  and [name] small area on the  right lateral foot. All of these underwent [name] substantial surgical debridement with curettes which she tolerated better than he has in the past. This started as [name] complex cellulitis in the face of chronic venous insufficiency and inflammation. 10/13/15; substantial cluster of wounds on the lateral aspect of his left leg confluently around to the other side. Extensive surgical debridement to remove for redness surface slough nonviable subcutaneous tissue. This is not an improvement. Also substantial wound on the medial right leg which is largely unchanged. The etiology of this was felt to be [name] complex cellulitis in the summer of 2016 in the face of chronic venous insufficiency and inflammation. The patient states that the wound on the right medial leg has been there for years off and on. 10/20/15; we are able to start [facility] to these extensive wound areas left greater than right on Tuesday after I spoke to the wound care nurse at the facility. He arrives here today for extensive surgical debridement for the wounds on the lateral aspect of his left leg posterior left leg, this is almost circumferential. He has [name] large wound on the medial aspect of his right leg although he finds this too painful for debridement 10/27/15; I continue to bring this patient back for frequent debridement/weekly debridement severe. We have been using Hydrofera Blue. Unfortunately this has not really had any improvement. The debridement surgery difficult and painful for the patient 11/23/15; this patient spent [name] complex hospitalization admitted with acute kidney injury, anemia, cellulitis of the lower extremity, he was felt to have sepsis pathophysiology although his blood cultures were negative. He had plain x-rays of both legs that were negative for osseous abnormalities. He was seen by infectious disease and placed on [name] workup for vasculitis that was negative including [name] biopsy 4 all were consistent with stasis dermatitis.  Vital broad- spectrum antibiotics he continued to spike fevers. Urine and chest x-ray were negative Dopplers on admission ruled out [name] DVT All antibiotics were stopped on . 2/17. Since his return to [facility] skilled nursing facility I believe they have been using Xeroform 12/07/15 the patient arrives today with the area on his right medial leg actually looking quite stable. No debridement. The rest of his extensive wounds on the left lateral left posterior extending into the left medial leg all required extensive debridement. I think this is probably going to need to and up in the hands of plastic surgery. We'll attempt to change him back to [facility]. Arrange consultation with plastic surgery at [facility] for this almost circumferential wound on the left side 12/21/15 right leg covered in surface slough. This was debridement. Left leg extensive wounds all carefully examined. This is almost circumferential on the left side especially on the posterior calf. No debridement is necessary. 01/04/16 the patient has been to [facility] plastic surgery. Unfortunately it doesn't look like they had any of the prior workup on this patient. They're in the middle of vascular workup. It sounds as though they're applying Hydrofera Blue at the nursing home. 01/22/16; the patient has been back to see [facility]. There apparently making plans to possibly do skin grafts over his large venous insufficiency ulcerations. The patient tells me that he goes to hematology in [facility] and variably uses the term platelets red cells and the description of his problem. He is also [name] [employer] account [employer] and will not allow transfusion of blood products. I do not see any major difference in the  wound on the right medial leg and circumferentially across the left medial to left lateral leg. Did not attempt to debride these today. Readmission: 05/05/18 on evaluation today patient presents for reevaluation here in our clinic although I  have previously seen him in the skilled nursing facility over the past year and [name] half when I was working in the skilled nursing facility realm instead of covering in the clinic. With that being said during that time we had initiated most recently dressings with Hydrofera Blue Dressing along with the [employer] which had done very well for him. Much better than the Kerlex Coban wraps. With that being said the patient had been doing fairly well and was making progress when I last saw him. Upon evaluation today it appears that the wounds are actually [name] little bit worse than when I last saw him although definitely not dramatically so. There does not appear to be any evidence of infection which is good news. With that being said he has been tolerating the wraps without complication his left hip still gives him [name] lot of trouble which is his main issue as far as movement is concerned. Unbeknownst to me he has not had venous studies that I can find. He previously told me he had there were arterial studies noted back from 04/27/15 which showed that he had try phasic blood flow throughout and again his test appear to be completely normal as performed by Dr. Creig Hines. With that being said I cannot find where he had venous studies. The patient still states he thinks he did these definitely had no venous intervention at this point. Upon evaluation today it does not appear to me that the patient has any evidence of cellulitis. He does have some chronic venous insufficiency which I think has led to stasis dermatitis but again this is not appearing to be infected at this point. No fevers, chills, nausea, or vomiting noted at this time. READMISSION 06/30/2018 This is [name] now 66 year old man we have had in this clinic at multiple times in the past. Most recently he came in August and was seen by [facility] stone. It is not clear why he did not come back we asked him really did not get [name] straight answer. He is at [facility] skilled facility he is [name] man who has severe chronic venous insufficiency with lymphedema and has had severe wounds on his left greater than right leg. We had him here in 2016 and 17 ultimately referred him to [facility]. He had arterial studies and venous studies done at [facility] although I am not able to access these records I see they are actually done. He also had multiple biopsies that were negative for malignancy showing changes compatible with chronic inflammation. As I understand things in [place] they are using Iodoflex and Unna boots. I am not really sure how they are getting enough Iodoflex for the large wound area especially on his left calf. Nevertheless the wounds look better than when I saw this in 2017. There were plans for him to have plastic surgery in 2018 and I think he was actually prepped for surgery however he was ultimately denied because the patient was an active smoker. The patient is not systemically unwell. The wounds are painful however given the size especially on the left this is not surprising. ABIs in this clinic were 0.78 on the left and 0.91 on the right 07/13/2018; this is [name] patient with bilateral severe venous ulcers with  secondary lymphedema. The wounds are on the right medial calf and [name] substantial part of the left posterior calf and some involvement medially and laterally on the left. We changed him to silver alginate under Unna boot's last time. The wounds actually look fairly satisfactory today. 08/14/2018; this is [name] patient with severe bilateral venous stasis ulcers with secondary lymphedema left greater than right he is at [facility] using silver alginate under Unna boot's I do not think there is much change on this visit versus last time I saw him [name] month ago Readmission: 11/04/18 patient presents today for follow-up concerning his bilateral lower extremity ulcers. His [name] right medial ankle ulcer and [name] left leg that has ulcers over [name] large  proportion of the surface area between the ankle and knee. Unfortunately this does cause some discomfort for the patient although it doesn't seem to be as uncomfortable as it has been in the past. He is seen today for evaluation after [name] referral by [place] back to the clinic. Unfortunately has [name] lot of Slough noted on the surface of his wounds. He's been treated currently it sounds like with Dakin's soaked gauze and they have not been performing the Unna Boot wrap that was previously recommended. I'm not exactly sure what the reason for the change was. He does have less slough on the surface of the wound but again this is something that is often [name] constant issue for him. Again he's had these wounds for many years. I've known him for two years at least during that time when I was taking care of them in the facility he is Artie had the wounds for several years prior. READMISSION 02/25/2020 [name] is [name] now 66 year old man. He has been in our clinic several times before most extensively in October 2016 through May 2017. At that time he had absolutely substantial bilateral lower extremity wounds secondary to chronic venous insufficiency and lymphedema. We ultimately referred him to [facility] he had [name] series of biopsies only showed suggestion of wound secondary to chronic venous insufficiency. He had [name] less extensive stay in our clinic in 2019 and then [name] single visit in February 2020. He is [name] resident at [facility] skilled facility. I think he has had intermittently had in facility wound care. He returns today. They are using silver alginate on the wounds although I am not sure what type of compression. Previously he has favored Unna boots. He is not felt to have an arterial issue. Past medical history; lymphedema and chronic venous insufficiency, diabetes mellitus, hypertension, congestive heart failure We did not do arterial studies today 04/27/2020 on evaluation patient appears to be doing really about  the same as when I have known him and seen him in the past. He does have wounds on the right and left legs at this point. Fortunately there is no signs of active infection at this time. 9/2; 1 month follow-up. This is [name] man with severe chronic venous insufficiency and lymphedema. When I first met him he had horrible circumferential bilateral lower extremity ulcers. We have been using silver alginate up until early last month when it was changed to [facility] and Unna boot's more or less says maintenance dressings. Since the last time he was here the facility where he resides [facility] called to report they could no longer do Unna boot so he is apparently only been receiving kerlix Coban the left leg is certainly [name] lot worse with almost circumferential large wounds especially lateral but  now medial and posterior as well. He has [name] standard same looking wound on the right medial lower leg. 10/1; absolutely no improvement here. In fact I do not think anything much is changed. The substantial area on his left lateral and left posterior calf is still open may be slightly larger. He has [name] more modest wound on the right medial calf. None of these look any different. He comes in with [name] kerlix Coban wrap this will simply not be adequate. He has too much edema in this leg He also is complaining [name] lot of pain in his left heel area. 11/8; potential wounds on the left lateral and posterior calf and [name] smaller oval wound in the right medial. We have been using Hydrofera Blue under 4-layer compression. He was using Unna boots still 2 months ago the facility called to say they can no longer place these so we have been using 4-layer compression. The surface area of the area on the left is so large there is very few alternatives to what we can use on this either silver alginate or Hydrofera Blue. He was complaining of pain on the left heel last time I asked for an x-ray this was apparently done although we do not  have the result. He is not complaining of pain today. 08/16/2020 on evaluation today patient is is seen for [name] early visit due to the fact that he apparently is having issues I was told when they called on Monday with [name] MRSA infection that they felt like we needed to see him for because his legs "looked horrible". With that being said based on what I see at this point it does not appear that the patient is actually having [name] terrible infection in fact compared to last time I saw him his legs do not appear to be doing too poorly at all at this time. Nonetheless he has been on doxycycline that is not going to be [name] good option for him based on the culture report that I have for review today which graded as [name] partial report with still several organisms pending as far as identification is concerned we did contact them but again they do not have the final report yet. Either way based on what we see it appears that doxycycline is one of the few medications that will not work for the Citrobacter. Obviously I think that [name] different medication would be [name] good option for him since there is [name] gram-negative organism pending I am likely can I suggest Cipro as the best of backup antibiotic to switch him to at this point. All this was discussed with the patient today. 12/20; not much change in the substantial wound on the left posterior calf and the small area on the right medial. He has [name] new area on the left anterior which looks like [name] wrap injury. Had some thoughts about doing [name] deep tissue culture here although we will put that off for next time. Using silver alginate as [name] primary dressing 2/10; substantial area on the left posterior calf. This measures smaller but still is [name] very large area. He has the area on the right anterior medial which also is [name] fairly large wound but much smaller than not on the left. His edema control is quite good. We have been using silver alginate because of the size of  the wounds. 4/7; substantial wound on the left posterior calf and [name] smaller area on the right medial lower leg. These are very chronic  wounds which we have classified is venous. Previously worked up at [facility] for 5 years ago at which time I had sent and will send him over for consideration of extensive skin graft I know they biopsied this many times but he never had plastic surgery. The wound area is much too large for standard size dressings we have been using silver alginate but we did give him [name] good trial last fall of Hydrofera Blue that did not make any difference either. If anything the area on the right medial lower leg over time has gotten [name] lot smaller as has the area on the left although it still quite substantial now. Readmission 10/08/2021 Mr. Caster Fayette is [name] 66 year old male with [name] past medical history of insulin-dependent type 2 diabetes with last hemoglobin A1c of 10.2, chronic venous insufficiency, lymphedema and chronic diastolic heart failure that presents to the clinic for bilateral lower extremity wounds. He has been followed in our clinic previously for the past 6 years for these issues. He was last seen in April 2022. He is inconsistent with his appointments in our clinic. These wounds have not healed over the past few years. He is currently using silver alginate to the wound beds and keeping these covered with Kerlix. He currently denies systemic signs of infection. He was recently hospitalized for septic shock secondary to bacteremia from likely chronic leg wounds And is currently on oral antibiotics to be completed this week. 1/30; patient presents for follow-up. He has no issues or complaints today. 2/6; patient presents for follow-up. He reports more discomfort with the increase in compression wraps. He currently denies systemic signs of infection and feels well overall. 2/13; patient presents for follow-up. He reports tolerating the current leg/Coban wrap well. He denies  systemic signs of infection and feels fine overall. 2/21; patient presents for follow-up. He has no issues or complaints today. He tolerated the kirlex/Coban wrap. He denies systemic signs of infection. Electronic Signature(s) Signed: 11/13/2021 11:29:48 AM By: [name] DO Entered By: [name] on 11/13/2021 11:14:59 -------------------------------------------------------------------------------- Physical Exam Details Patient Name: Date of Service: [name], [name] 11/13/2021 9:30 [name] M Medical Record Number: [id number] Patient Account Number: [id number] Date of Birth/Sex: Treating RN: [birth year] (66 y.o. M) Primary Care Provider: [name] Other Clinician: Referring Provider: Treating Provider/Extender: [name] in Treatment: 5 Constitutional respirations regular, non-labored and within target range for patient.Marland Kitchen Psychiatric pleasant and cooperative. Notes Right lower extremity: Open wounds with granulation tissue and non viable tissue throughout. Left lower extremity: 2 large wounds with granulation tissue and non viable tissue throughout. No signs of surrounding infection. Lymphedema skin changes bilaterally. Faint pedal pulses. Electronic Signature(s) Signed: 11/13/2021 11:29:48 AM By: [name] DO Entered By: [name] on 11/13/2021 11:16:05 -------------------------------------------------------------------------------- Physician Orders Details Patient Name: Date of Service: [name], [name] 11/13/2021 9:30 [name] M Medical Record Number: [id number] Patient Account Number: [id number] Date of Birth/Sex: Treating RN: Oct 31, 1955 (66 y.o. [name], Lauren Primary Care Provider: [name] Other Clinician: Referring Provider: Treating Provider/Extender: [name] in Treatment: 5 Verbal / Phone Orders: No Diagnosis Coding Follow-up Appointments ppointment in 1 week. - *****Extra Time**** 75  minutes - ***HOYER*** Return [name] Dr Heber Houghton w/ Allayne Butcher Room # 6 ppointment in: - Infectious Disease appt. 11/22/2021 Return [name] Bathing/ Shower/ Hygiene May shower and wash wound with soap and water. - when changing dressing Edema Control - Lymphedema / SCD / Other Elevate legs to the level of the heart  or above for 30 minutes daily and/or when sitting, [name] frequency of: - throughout the day Moisturize legs daily. - with dressing changes Additional Orders / Instructions Follow Nutritious Diet - Monitor/Control Blood Sugars-Continue using ProStat Other: - Go to emergency room for fever, chills, strong odor from wounds, increased pain in wounds Wound Treatment Wound #25 - Lower Leg Wound Laterality: Right, Medial Cleanser: Anasept Antimicrobial Skin and Wound Cleanser, 8 (oz) 3 x Per Week/30 Days Discharge Instructions: Cleanse the wound with Anasept cleanser prior to applying [name] clean dressing using gauze sponges, not tissue or cotton balls. Peri-Wound Care: Zinc Oxide Ointment 30g tube 3 x Per Week/30 Days Discharge Instructions: Apply Zinc Oxide to periwound with each dressing change Peri-Wound Care: Sween Lotion (Moisturizing lotion) 3 x Per Week/30 Days Discharge Instructions: Apply moisturizing lotion as directed Topical: Gentamicin 3 x Per Week/30 Days Discharge Instructions: Apply directly to wound bed, under silver alginate Prim Dressing: KerraCel Ag Gelling Fiber Dressing, 4x5 in (silver alginate) 3 x Per Week/30 Days ary Discharge Instructions: Apply silver alginate to wound bed as instructed Secondary Dressing: ABD Pad, 8x10 3 x Per Week/30 Days Discharge Instructions: Apply over primary dressing as directed. Compression Wrap: Kerlix Roll 4.5x3.1 (in/yd) 3 x Per Week/30 Days Discharge Instructions: Apply Kerlix and Coban compression as directed. Compression Wrap: Coban Self-Adherent Wrap 4x5 (in/yd) 3 x Per Week/30 Days Discharge Instructions: Apply over Kerlix as directed. Wound  #26 - Lower Leg Wound Laterality: Left, Posterior, Proximal Cleanser: Anasept Antimicrobial Skin and Wound Cleanser, 8 (oz) 3 x Per Week/30 Days Discharge Instructions: Cleanse the wound with Anasept cleanser prior to applying [name] clean dressing using gauze sponges, not tissue or cotton balls. Peri-Wound Care: Zinc Oxide Ointment 30g tube 3 x Per Week/30 Days Discharge Instructions: Apply Zinc Oxide to periwound with each dressing change Peri-Wound Care: Sween Lotion (Moisturizing lotion) 3 x Per Week/30 Days Discharge Instructions: Apply moisturizing lotion as directed Topical: Gentamicin 3 x Per Week/30 Days Discharge Instructions: Apply directly to wound bed, under silver alginate Prim Dressing: KerraCel Ag Gelling Fiber Dressing, 4x5 in (silver alginate) 3 x Per Week/30 Days ary Discharge Instructions: Apply silver alginate to wound bed as instructed Secondary Dressing: ABD Pad, 8x10 3 x Per Week/30 Days Discharge Instructions: Apply over primary dressing as directed. Secondary Dressing: Zetuvit Plus 4x8 in 3 x Per Week/30 Days Discharge Instructions: or other superabsorbant pad. Apply over primary dressing as directed. Compression Wrap: Kerlix Roll 4.5x3.1 (in/yd) 3 x Per Week/30 Days Discharge Instructions: Apply Kerlix and Coban compression as directed. Compression Wrap: Coban Self-Adherent Wrap 4x5 (in/yd) 3 x Per Week/30 Days Discharge Instructions: Apply over Kerlix as directed. Wound #27 - Lower Leg Wound Laterality: Left, Posterior Cleanser: Anasept Antimicrobial Skin and Wound Cleanser, 8 (oz) 3 x Per Week/30 Days Discharge Instructions: Cleanse the wound with Anasept cleanser prior to applying [name] clean dressing using gauze sponges, not tissue or cotton balls. Peri-Wound Care: Zinc Oxide Ointment 30g tube 3 x Per Week/30 Days Discharge Instructions: Apply Zinc Oxide to periwound with each dressing change Peri-Wound Care: Sween Lotion (Moisturizing lotion) 3 x Per Week/30  Days Discharge Instructions: Apply moisturizing lotion as directed Topical: Gentamicin 3 x Per Week/30 Days Discharge Instructions: Apply directly to wound bed, under silver alginate Prim Dressing: KerraCel Ag Gelling Fiber Dressing, 4x5 in (silver alginate) 3 x Per Week/30 Days ary Discharge Instructions: Apply silver alginate to wound bed as instructed Secondary Dressing: ABD Pad, 8x10 3 x Per Week/30 Days Discharge Instructions: Apply over primary  dressing as directed. Secondary Dressing: Zetuvit Plus 4x8 in 3 x Per Week/30 Days Discharge Instructions: or other superabsorbant pad. Apply over primary dressing as directed. Compression Wrap: Kerlix Roll 4.5x3.1 (in/yd) 3 x Per Week/30 Days Discharge Instructions: Apply Kerlix and Coban compression as directed. Compression Wrap: Coban Self-Adherent Wrap 4x5 (in/yd) 3 x Per Week/30 Days Discharge Instructions: Apply over Kerlix as directed. Wound #28 - Ankle Wound Laterality: Right, Lateral Cleanser: Anasept Antimicrobial Skin and Wound Cleanser, 8 (oz) 3 x Per Week/30 Days Discharge Instructions: Cleanse the wound with Anasept cleanser prior to applying [name] clean dressing using gauze sponges, not tissue or cotton balls. Peri-Wound Care: Zinc Oxide Ointment 30g tube 3 x Per Week/30 Days Discharge Instructions: Apply Zinc Oxide to periwound with each dressing change Peri-Wound Care: Sween Lotion (Moisturizing lotion) 3 x Per Week/30 Days Discharge Instructions: Apply moisturizing lotion as directed Topical: Gentamicin 3 x Per Week/30 Days Discharge Instructions: Apply directly to wound bed, under silver alginate Prim Dressing: KerraCel Ag Gelling Fiber Dressing, 4x5 in (silver alginate) 3 x Per Week/30 Days ary Discharge Instructions: Apply silver alginate to wound bed as instructed Secondary Dressing: ABD Pad, 8x10 3 x Per Week/30 Days Discharge Instructions: Apply over primary dressing as directed. Compression Wrap: Kerlix Roll 4.5x3.1  (in/yd) 3 x Per Week/30 Days Discharge Instructions: Apply Kerlix and Coban compression as directed. Compression Wrap: Coban Self-Adherent Wrap 4x5 (in/yd) 3 x Per Week/30 Days Discharge Instructions: Apply over Kerlix as directed. Patient Medications llergies: Sulfa (Sulfonamide Antibiotics) [name] Notifications Medication Indication Start End levofloxacin DOSE oral 750 mg tablet - 1 tablet oral EVERY 24 HOURS X 7 DAYS Electronic Signature(s) Signed: 11/13/2021 11:29:48 AM By: [name] DO Signed: 11/14/2021 5:31:46 PM By: Rhae Hammock RN Entered By: Rhae Hammock on 11/13/2021 11:19:47 Prescription 11/13/2021 -------------------------------------------------------------------------------- Ouzts, Linton Ham DO Patient Name: Provider: 1956-02-13 [id number] Date of Birth: NPI#[name] [id number] Sex: DEA #: [phone] Phone #: License #: [place] Patient Address: [place] [address] [place], [place] [address] [place], [place] [address] [phone] Allergies Sulfa (Sulfonamide Antibiotics) Medication Medication: Route: Strength: Form: levofloxacin oral 750 mg tablet Class: QUINOLONE ANTIBIOTICS Dose: Frequency / Time: Indication: 1 tablet oral EVERY 24 HOURS X 7 DAYS Number of Refills: Number of Units: 0 Generic Substitution: Start Date: End Date: Administered at Facility: Substitution Permitted No Note to Pharmacy: Hand Signature: Date(s): Electronic Signature(s) Signed: 11/13/2021 11:29:48 AM By: [name] DO Entered By: [name] on 11/13/2021 11:29:13 -------------------------------------------------------------------------------- Problem List Details Patient Name: Date of Service: [name], [name] 11/13/2021 9:30 [name] M Medical Record Number: [id number] Patient Account Number: [id number] Date of Birth/Sex: Treating  RN: 1956/08/18 (66 y.o. M) Primary Care Provider: [name] Other Clinician: Referring Provider: Treating Provider/Extender: [name] in Treatment: 5 Active Problems ICD-10 Encounter Code Description Active Date MDM Diagnosis I87.333 Chronic venous hypertension (idiopathic) with ulcer and inflammation of 10/08/2021 No Yes bilateral lower extremity 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ed1/16/2023 No Yes L97.912 Non-pressure chronic ulcer of unspecified part of right lower leg with fat layer 10/08/2021 No Yes exposed I89.0 Lymphedema, not elsewhere classified 10/08/2021 No Yes Inactive Problems Resolved Problems Electronic Signature(s) Signed: 11/13/2021 11:29:48 AM By: [name] DO Entered By: [name] on 11/13/2021 11:13:44 -------------------------------------------------------------------------------- Progress Note Details Patient Name: Date of Service: [name], [name] 11/13/2021 9:30 [name]  M Medical Record Number: [id number] Patient Account Number: [id number] Date of Birth/Sex: Treating RN: 01-09-1956 (66 y.o. M) Primary Care Provider: [name] Other Clinician: Referring Provider: Treating Provider/Extender: [name] in Treatment: 5 Subjective Chief Complaint Information obtained from Patient 10/08/2021; bilateral lower extremity wounds History of Present Illness (HPI) 07/07/15; this is [name] patient who is in hospital on 8/2 through 8/4. He had cellulitis and abscess of predominantly I think the left leg. He received IV antibiotics. Plain x-ray showed no osteomyelitis. An MRI of the left leg did not show osteomyelitis. Cultures showed no predominant organism. His hemoglobin A1c was 9.8. He has [name] history of venous stasis also peripheral vascular disease. He was discharged to [place] home. He really has extensive ulcerations on the left lateral  leg including [name] major wound that communicates both posteriorly and superiorly w. When drainage coming out of 2 smaller areas. He has [name] smaller wound on the posterior medial left leg. He has more predominantly venous insufficiency wounds on predominantly the right medial leg above the ankle the right foot into sections. He has recently been put on Augmentin at the nursing home. Previous ABIs/arterial evaluation showed triphasic waves diffusely he does not have [name] major ischemic issue [name] left lower extremity venous duplex also exam showed no evidence of [name] DVT on 6/21 07/21/15; the patient arrives with really no major change. Culture I did last time was negative. He has not had [name] follow-up MRI I ordered. The wounds are macerated covered with [name] thick gelatinous surface slough. There is necrotic subcutaneous tissue 08/04/15; the patient arrives with [name] considerable improvement in the majority of his wound area. The area on the left leg now has what looks to be [name] granulated base. Most of the wounds on the left leg required an aggressive surgical debridement to remove nonviable fibrinous eschar and subcutaneous tissue however after debridement most of this looks better. Although I had asked for repeat MRI of the left leg when he first came in here with grossly purulent material coming out of his wounds, it does not appear that this is been done and nor do I actually feel that strongly about it right now. He has severe surrounding venous insufficiency and inflammation. I don't believe he has significant PAD 08/25/15. In general there is still [name] considerable wound area here but with still extensive service adherent slough. The patient will not allow mechanical debridement due to pain. He did not tolerate Medihoney therefore we are left with Santyl for now. She would appear that he has severe surrounding venous stasis. There is no evidence of the infection that may have had something to do with the pathogenesis of  these wounds 09/29/15; patient still has substantial wound area on the left leg with [name] cluster of several wounds on the lateral leg confluently on the left leg posteriorly and then [name] substantial wound on the medial leg. On the right leg [name] substantial wound medially and [name] small area on the right lateral foot. All of these underwent [name] substantial surgical debridement with curettes which she tolerated better than he has in the past. This started as [name] complex cellulitis in the face of chronic venous insufficiency and inflammation. 10/13/15; substantial cluster of wounds on the lateral aspect of his left leg confluently around to the other side. Extensive surgical debridement to remove for redness surface slough nonviable subcutaneous tissue. This is not an improvement. Also substantial wound on the medial right leg which is largely  unchanged. The etiology of this was felt to be [name] complex cellulitis in the summer of 2016 in the face of chronic venous insufficiency and inflammation. The patient states that the wound on the right medial leg has been there for years off and on. 10/20/15; we are able to start [facility] to these extensive wound areas left greater than right on Tuesday after I spoke to the wound care nurse at the facility. He arrives here today for extensive surgical debridement for the wounds on the lateral aspect of his left leg posterior left leg, this is almost circumferential. He has [name] large wound on the medial aspect of his right leg although he finds this too painful for debridement 10/27/15; I continue to bring this patient back for frequent debridement/weekly debridement severe. We have been using Hydrofera Blue. Unfortunately this has not really had any improvement. The debridement surgery difficult and painful for the patient 11/23/15; this patient spent [name] complex hospitalization admitted with acute kidney injury, anemia, cellulitis of the lower extremity, he was felt to have  sepsis pathophysiology although his blood cultures were negative. He had plain x-rays of both legs that were negative for osseous abnormalities. He was seen by infectious disease and placed on [name] workup for vasculitis that was negative including [name] biopsy o4 all were consistent with stasis dermatitis. Vital broad-spectrum antibiotics he continued to spike fevers. Urine and chest x-ray were negative Dopplers on admission ruled out [name] DVT All antibiotics were stopped on 2/17. . Since his return to [facility] skilled nursing facility I believe they have been using Xeroform 12/07/15 the patient arrives today with the area on his right medial leg actually looking quite stable. No debridement. The rest of his extensive wounds on the left lateral left posterior extending into the left medial leg all required extensive debridement. I think this is probably going to need to and up in the hands of plastic surgery. We'll attempt to change him back to [facility] Arrange consultation with plastic surgery at [facility] for this almost circumferential wound on the left side 12/21/15 right leg covered in surface slough. This was debridement. Left leg extensive wounds all carefully examined. This is almost circumferential on the left side especially on the posterior calf. No debridement is necessary. 01/04/16 the patient has been to [facility] plastic surgery. Unfortunately it doesn't look like they had any of the prior workup on this patient. They're in the middle of vascular workup. It sounds as though they're applying Hydrofera Blue at the nursing home. 01/22/16; the patient has been back to see [facility]. There apparently making plans to possibly do skin grafts over his large venous insufficiency ulcerations. The patient tells me that he goes to hematology in [facility] and variably uses the term platelets red cells and the description of his problem. He is also [name] [employer] account [employer] and will not allow transfusion  of blood products. I do not see any major difference in the wound on the right medial leg and circumferentially across the left medial to left lateral leg. Did not attempt to debride these today. Readmission: 05/05/18 on evaluation today patient presents for reevaluation here in our clinic although I have previously seen him in the skilled nursing facility over the past year and [name] half when I was working in the skilled nursing facility realm instead of covering in the clinic. With that being said during that time we had initiated most recently dressings with Hydrofera Blue Dressing along with the [employer]  wraps which had done very well for him. Much better than the Kerlex Coban wraps. With that being said the patient had been doing fairly well and was making progress when I last saw him. Upon evaluation today it appears that the wounds are actually [name] little bit worse than when I last saw him although definitely not dramatically so. There does not appear to be any evidence of infection which is good news. With that being said he has been tolerating the wraps without complication his left hip still gives him [name] lot of trouble which is his main issue as far as movement is concerned. Unbeknownst to me he has not had venous studies that I can find. He previously told me he had there were arterial studies noted back from 04/27/15 which showed that he had try phasic blood flow throughout and again his test appear to be completely normal as performed by Dr. Creig Hines. With that being said I cannot find where he had venous studies. The patient still states he thinks he did these definitely had no venous intervention at this point. Upon evaluation today it does not appear to me that the patient has any evidence of cellulitis. He does have some chronic venous insufficiency which I think has led to stasis dermatitis but again this is not appearing to be infected at this point. No fevers, chills, nausea, or vomiting  noted at this time. READMISSION 06/30/2018 This is [name] now 66 year old man we have had in this clinic at multiple times in the past. Most recently he came in August and was seen by [facility]. It is not clear why he did not come back we asked him really did not get [name] straight answer. He is at [facility] skilled facility he is [name] man who has severe chronic venous insufficiency with lymphedema and has had severe wounds on his left greater than right leg. We had him here in 2016 and 17 ultimately referred him to [facility]. He had arterial studies and venous studies done at [facility] although I am not able to access these records I see they are actually done. He also had multiple biopsies that were negative for malignancy showing changes compatible with chronic inflammation. As I understand things in [place] they are using Iodoflex and Unna boots. I am not really sure how they are getting enough Iodoflex for the large wound area especially on his left calf. Nevertheless the wounds look better than when I saw this in 2017. There were plans for him to have plastic surgery in 2018 and I think he was actually prepped for surgery however he was ultimately denied because the patient was an active smoker. The patient is not systemically unwell. The wounds are painful however given the size especially on the left this is not surprising. ABIs in this clinic were 0.78 on the left and 0.91 on the right 07/13/2018; this is [name] patient with bilateral severe venous ulcers with secondary lymphedema. The wounds are on the right medial calf and [name] substantial part of the left posterior calf and some involvement medially and laterally on the left. We changed him to silver alginate under Unna boot's last time. The wounds actually look fairly satisfactory today. 08/14/2018; this is [name] patient with severe bilateral venous stasis ulcers with secondary lymphedema left greater than right he is at [facility] using  silver alginate under Unna boot's I do not think there is much change on this visit versus last time I saw him  [name] month ago Readmission: 11/04/18 patient presents today for follow-up concerning his bilateral lower extremity ulcers. His [name] right medial ankle ulcer and [name] left leg that has ulcers over [name] large proportion of the surface area between the ankle and knee. Unfortunately this does cause some discomfort for the patient although it doesn't seem to be as uncomfortable as it has been in the past. He is seen today for evaluation after [name] referral by [place] back to the clinic. Unfortunately has [name] lot of Slough noted on the surface of his wounds. He's been treated currently it sounds like with Dakin's soaked gauze and they have not been performing the Unna Boot wrap that was previously recommended. I'm not exactly sure what the reason for the change was. He does have less slough on the surface of the wound but again this is something that is often [name] constant issue for him. Again he's had these wounds for many years. I've known him for two years at least during that time when I was taking care of them in the facility he is Artie had the wounds for several years prior. READMISSION 02/25/2020 [name] is [name] now 66 year old man. He has been in our clinic several times before most extensively in October 2016 through May 2017. At that time he had absolutely substantial bilateral lower extremity wounds secondary to chronic venous insufficiency and lymphedema. We ultimately referred him to [facility] he had [name] series of biopsies only showed suggestion of wound secondary to chronic venous insufficiency. He had [name] less extensive stay in our clinic in 2019 and then [name] single visit in February 2020. He is [name] resident at [facility] skilled facility. I think he has had intermittently had in facility wound care. He returns today. They are using silver alginate on the wounds although I am not sure what type of compression.  Previously he has favored Unna boots. He is not felt to have an arterial issue. Past medical history; lymphedema and chronic venous insufficiency, diabetes mellitus, hypertension, congestive heart failure We did not do arterial studies today 04/27/2020 on evaluation patient appears to be doing really about the same as when I have known him and seen him in the past. He does have wounds on the right and left legs at this point. Fortunately there is no signs of active infection at this time. 9/2; 1 month follow-up. This is [name] man with severe chronic venous insufficiency and lymphedema. When I first met him he had horrible circumferential bilateral lower extremity ulcers. We have been using silver alginate up until early last month when it was changed to [facility] and Unna boot's more or less says maintenance dressings. Since the last time he was here the facility where he resides [facility] called to report they could no longer do Unna boot so he is apparently only been receiving kerlix Coban the left leg is certainly [name] lot worse with almost circumferential large wounds especially lateral but now medial and posterior as well. He has [name] standard same looking wound on the right medial lower leg. 10/1; absolutely no improvement here. In fact I do not think anything much is changed. The substantial area on his left lateral and left posterior calf is still open may be slightly larger. He has [name] more modest wound on the right medial calf. None of these look any different. He comes in with [name] kerlix Coban wrap this will simply not be adequate. He has too much edema in this leg He also  is complaining [name] lot of pain in his left heel area. 11/8; potential wounds on the left lateral and posterior calf and [name] smaller oval wound in the right medial. We have been using Hydrofera Blue under 4-layer compression. He was using Unna boots still 2 months ago the facility called to say they can no longer place these so we  have been using 4-layer compression. The surface area of the area on the left is so large there is very few alternatives to what we can use on this either silver alginate or Hydrofera Blue. He was complaining of pain on the left heel last time I asked for an x-ray this was apparently done although we do not have the result. He is not complaining of pain today. 08/16/2020 on evaluation today patient is is seen for [name] early visit due to the fact that he apparently is having issues I was told when they called on Monday with [name] MRSA infection that they felt like we needed to see him for because his legs "looked horrible". With that being said based on what I see at this point it does not appear that the patient is actually having [name] terrible infection in fact compared to last time I saw him his legs do not appear to be doing too poorly at all at this time. Nonetheless he has been on doxycycline that is not going to be [name] good option for him based on the culture report that I have for review today which graded as [name] partial report with still several organisms pending as far as identification is concerned we did contact them but again they do not have the final report yet. Either way based on what we see it appears that doxycycline is one of the few medications that will not work for the Citrobacter. Obviously I think that [name] different medication would be [name] good option for him since there is [name] gram-negative organism pending I am likely can I suggest Cipro as the best of backup antibiotic to switch him to at this point. All this was discussed with the patient today. 12/20; not much change in the substantial wound on the left posterior calf and the small area on the right medial. He has [name] new area on the left anterior which looks like [name] wrap injury. Had some thoughts about doing [name] deep tissue culture here although we will put that off for next time. Using silver alginate as [name] primary dressing 2/10; substantial  area on the left posterior calf. This measures smaller but still is [name] very large area. He has the area on the right anterior medial which also is [name] fairly large wound but much smaller than not on the left. His edema control is quite good. We have been using silver alginate because of the size of the wounds. 4/7; substantial wound on the left posterior calf and [name] smaller area on the right medial lower leg. These are very chronic wounds which we have classified is venous. Previously worked up at [facility] for 5 years ago at which time I had sent and will send him over for consideration of extensive skin graft I know they biopsied this many times but he never had plastic surgery. The wound area is much too large for standard size dressings we have been using silver alginate but we did give him [name] good trial last fall of Hydrofera Blue that did not make any difference either. If anything the area on the right medial lower leg  over time has gotten [name] lot smaller as has the area on the left although it still quite substantial now. Readmission 10/08/2021 Mr. [name] is [name] 66 year old male with [name] past medical history of insulin-dependent type 2 diabetes with last hemoglobin A1c of 10.2, chronic venous insufficiency, lymphedema and chronic diastolic heart failure that presents to the clinic for bilateral lower extremity wounds. He has been followed in our clinic previously for the past 6 years for these issues. He was last seen in April 2022. He is inconsistent with his appointments in our clinic. These wounds have not healed over the past few years. He is currently using silver alginate to the wound beds and keeping these covered with Kerlix. He currently denies systemic signs of infection. He was recently hospitalized for septic shock secondary to bacteremia from likely chronic leg wounds And is currently on oral antibiotics to be completed this week. 1/30; patient presents for follow-up. He has no issues or  complaints today. 2/6; patient presents for follow-up. He reports more discomfort with the increase in compression wraps. He currently denies systemic signs of infection and feels well overall. 2/13; patient presents for follow-up. He reports tolerating the current leg/Coban wrap well. He denies systemic signs of infection and feels fine overall. 2/21; patient presents for follow-up. He has no issues or complaints today. He tolerated the kirlex/Coban wrap. He denies systemic signs of infection. Patient History Information obtained from Patient. Family History Diabetes - Mother,Father,Siblings, Hypertension - Mother,Father, No family history of Cancer, Heart Disease, Hereditary Spherocytosis, Kidney Disease, Lung Disease, Seizures, Stroke, Thyroid Problems, Tuberculosis. Social History Current every day smoker - 4 cig per day, Marital Status - Separated, Alcohol Use - Never, Drug Use - Prior History - cocaine, [place] quit 2005, Caffeine Use - Moderate. Medical History Eyes Denies history of Cataracts, Glaucoma, Optic Neuritis Ear/Nose/Mouth/Throat Denies history of Chronic sinus problems/congestion, Middle ear problems Hematologic/Lymphatic Patient has history of Anemia - iron deficiency Denies history of Hemophilia, Human Immunodeficiency Virus, Lymphedema, Sickle Cell Disease Respiratory Patient has history of Sleep Apnea Denies history of Aspiration, Asthma, Chronic Obstructive Pulmonary Disease (COPD), Pneumothorax, Tuberculosis Cardiovascular Patient has history of Congestive Heart Failure, Coronary Artery Disease, Hypertension, Peripheral Venous Disease Denies history of Angina, Arrhythmia, Deep Vein Thrombosis, Hypotension, Myocardial Infarction, Peripheral Arterial Disease, Phlebitis, Vasculitis Gastrointestinal Denies history of Cirrhosis , Colitis, Crohnoos, Hepatitis [name], Hepatitis B, Hepatitis C Endocrine Patient has history of Type II Diabetes - uncontrolled Denies history of  Type I Diabetes Genitourinary Denies history of End Stage Renal Disease Immunological Denies history of Lupus Erythematosus, Raynaudoos, Scleroderma Integumentary (Skin) Denies history of History of Burn Musculoskeletal Patient has history of Osteoarthritis Denies history of Gout, Rheumatoid Arthritis, Osteomyelitis Neurologic Patient has history of Neuropathy Denies history of Dementia, Quadriplegia, Paraplegia, Seizure Disorder Oncologic Denies history of Received Chemotherapy, Received Radiation Psychiatric Denies history of Anorexia/bulimia, Confinement Anxiety Hospitalization/Surgery History - inguinal hernia repair with mesh. - right shoulder rotator cuff repair. - toe nail excision. - Sepsis 09/03/21-09/10/22. Medical [name] Surgical History Notes nd Gastrointestinal GERD Musculoskeletal degenerative righ thip Psychiatric depression Objective Constitutional respirations regular, non-labored and within target range for patient.. Vitals Time Taken: 9:39 AM, Height: 77 in, Weight: 258 lbs, BMI: 30.6, Temperature: 99.7 F, Pulse: 90 bpm, Respiratory Rate: 17 breaths/min, Blood Pressure: 119/71 mmHg. Psychiatric pleasant and cooperative. General Notes: Right lower extremity: Open wounds with granulation tissue and non viable tissue throughout. Left lower extremity: 2 large wounds with granulation tissue and non viable tissue throughout. No signs  of surrounding infection. Lymphedema skin changes bilaterally. Faint pedal pulses. Integumentary (Hair, Skin) Wound #25 status is Open. Original cause of wound was Gradually Appeared. The date acquired was: 09/07/2021. The wound has been in treatment 5 weeks. The wound is located on the Right,Medial Lower Leg. The wound measures 5.5cm length x 4.5cm width x 0.2cm depth; 19.439cm^2 area and 3.888cm^3 volume. There is Fat Layer (Subcutaneous Tissue) exposed. There is no tunneling or undermining noted. There is [name] large amount of  serosanguineous drainage noted. The wound margin is thickened. There is large (67-100%) red granulation within the wound bed. There is [name] small (1-33%) amount of necrotic tissue within the wound bed including Adherent Slough. Wound #25 status is Open. Original cause of wound was Gradually Appeared. The date acquired was: 09/07/2021. The wound has been in treatment 5 weeks. The wound is located on the Right,Medial Lower Leg. The wound measures 5.5cm length x 4.5cm width x 0.2cm depth; 19.439cm^2 area and 3.888cm^3 volume. There is [name] large amount of serosanguineous drainage noted. Wound #26 status is Open. Original cause of wound was Gradually Appeared. The date acquired was: 09/07/2021. The wound has been in treatment 5 weeks. The wound is located on the Left,Proximal,Posterior Lower Leg. The wound measures 5cm length x 5cm width x 0.1cm depth; 19.635cm^2 area and 1.963cm^3 volume. There is Fat Layer (Subcutaneous Tissue) exposed. There is no tunneling or undermining noted. There is [name] large amount of purulent drainage noted. Foul odor after cleansing was noted. The wound margin is distinct with the outline attached to the wound base. There is large (67-100%) red, pink granulation within the wound bed. There is [name] small (1-33%) amount of necrotic tissue within the wound bed including Adherent Slough. Wound #27 status is Open. Original cause of wound was Gradually Appeared. The date acquired was: 09/07/2021. The wound has been in treatment 5 weeks. The wound is located on the Left,Posterior Lower Leg. The wound measures 22cm length x 17cm width x 0.6cm depth; 293.739cm^2 area and 176.243cm^3 volume. There is Fat Layer (Subcutaneous Tissue) exposed. There is no tunneling or undermining noted. There is [name] large amount of purulent drainage noted. The wound margin is distinct with the outline attached to the wound base. There is small (1-33%) red, friable granulation within the wound bed. There is [name]  large (67-100%) amount of necrotic tissue within the wound bed including Eschar and Adherent Slough. Wound #28 status is Open. Original cause of wound was Gradually Appeared. The date acquired was: 11/05/2021. The wound has been in treatment 1 weeks. The wound is located on the Right,Lateral Ankle. The wound measures 5.6cm length x 1cm width x 0.1cm depth; 4.398cm^2 area and 0.44cm^3 volume. There is Fat Layer (Subcutaneous Tissue) exposed. There is no tunneling or undermining noted. There is [name] medium amount of serosanguineous drainage noted. The wound margin is flat and intact. There is large (67-100%) red granulation within the wound bed. There is [name] small (1-33%) amount of necrotic tissue within the wound bed including Adherent Slough. 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 Unfortunately We cannot get in contact with the patient's facility to obtain [facility] antibiotics. We have called on multiple occasions. At this time I recommended levofloxacin based on culture results. He  has an appointment with infectious disease on 3/2. I recommended continuing with gentamicin ointment and silver alginate under Kerlix/Coban. Plan Follow-up Appointments: Return Appointment in 1 week. - *****Extra Time**** 75 minutes - ***HOYER*** Dr Heber [place] w/ [facility] Room # 6 Return Appointment in: - Infectious Disease appt. 11/22/2021 Bathing/ Shower/ Hygiene: May shower and wash wound with soap and water. - when changing dressing Edema Control - Lymphedema / SCD / Other: Elevate legs to the level of the heart or above for 30 minutes daily and/or when sitting, [name] frequency of: - throughout the  day Moisturize legs daily. - with dressing changes Additional Orders / Instructions: Follow Nutritious Diet - Monitor/Control Blood Sugars-Continue using ProStat Other: - Go to emergency room for fever, chills, strong odor from wounds, increased pain in wounds WOUND #25: - Lower Leg Wound Laterality: Right, Medial Cleanser: Anasept Antimicrobial Skin and Wound Cleanser, 8 (oz) 3 x Per Week/30 Days Discharge Instructions: Cleanse the wound with Anasept cleanser prior to applying [name] clean dressing using gauze sponges, not tissue or cotton balls. Peri-Wound Care: Zinc Oxide Ointment 30g tube 3 x Per Week/30 Days Discharge Instructions: Apply Zinc Oxide to periwound with each dressing change Peri-Wound Care: Sween Lotion (Moisturizing lotion) 3 x Per Week/30 Days Discharge Instructions: Apply moisturizing lotion as directed Topical: Gentamicin 3 x Per Week/30 Days Discharge Instructions: Apply directly to wound bed, under silver alginate Prim Dressing: KerraCel Ag Gelling Fiber Dressing, 4x5 in (silver alginate) 3 x Per Week/30 Days ary Discharge Instructions: Apply silver alginate to wound bed as instructed Secondary Dressing: ABD Pad, 8x10 3 x Per Week/30 Days Discharge Instructions: Apply over primary dressing as directed. Com pression Wrap: Kerlix Roll 4.5x3.1 (in/yd) 3 x Per Week/30 Days Discharge Instructions: Apply Kerlix and Coban compression as directed. Com pression Wrap: Coban Self-Adherent Wrap 4x5 (in/yd) 3 x Per Week/30 Days Discharge Instructions: Apply over Kerlix as directed. WOUND #26: - Lower Leg Wound Laterality: Left, Posterior, Proximal Cleanser: Anasept Antimicrobial Skin and Wound Cleanser, 8 (oz) 3 x Per Week/30 Days Discharge Instructions: Cleanse the wound with Anasept cleanser prior to applying [name] clean dressing using gauze sponges, not tissue or cotton balls. Peri-Wound Care: Zinc Oxide Ointment 30g tube 3 x Per Week/30 Days Discharge Instructions: Apply Zinc Oxide  to periwound with each dressing change Peri-Wound Care: Sween Lotion (Moisturizing lotion) 3 x Per Week/30 Days Discharge Instructions: Apply moisturizing lotion as directed Topical: Gentamicin 3 x Per Week/30 Days Discharge Instructions: Apply directly to wound bed, under silver alginate Prim Dressing: KerraCel Ag Gelling Fiber Dressing, 4x5 in (silver alginate) 3 x Per Week/30 Days ary Discharge Instructions: Apply silver alginate to wound bed as instructed Secondary Dressing: ABD Pad, 8x10 3 x Per Week/30 Days Discharge Instructions: Apply over primary dressing as directed. Secondary Dressing: Zetuvit Plus 4x8 in 3 x Per Week/30 Days Discharge Instructions: or other superabsorbant pad. Apply over primary dressing as directed. Com pression Wrap: Kerlix Roll 4.5x3.1 (in/yd) 3 x Per Week/30 Days Discharge Instructions: Apply Kerlix and Coban compression as directed. Com pression Wrap: Coban Self-Adherent Wrap 4x5 (in/yd) 3 x Per Week/30 Days Discharge Instructions: Apply over Kerlix as directed. WOUND #27: - Lower Leg Wound Laterality: Left, Posterior Cleanser: Anasept Antimicrobial Skin and Wound Cleanser, 8 (oz) 3 x Per Week/30 Days Discharge Instructions: Cleanse the wound with Anasept cleanser prior to applying [name] clean dressing using gauze sponges, not tissue or cotton balls. Peri-Wound Care: Zinc Oxide Ointment 30g tube 3 x Per Week/30 Days Discharge Instructions: Apply Zinc Oxide  to periwound with each dressing change Peri-Wound Care: Sween Lotion (Moisturizing lotion) 3 x Per Week/30 Days Discharge Instructions: Apply moisturizing lotion as directed Topical: Gentamicin 3 x Per Week/30 Days Discharge Instructions: Apply directly to wound bed, under silver alginate Prim Dressing: KerraCel Ag Gelling Fiber Dressing, 4x5 in (silver alginate) 3 x Per Week/30 Days ary Discharge Instructions: Apply silver alginate to wound bed as instructed Secondary Dressing: ABD Pad, 8x10 3 x Per  Week/30 Days Discharge Instructions: Apply over primary dressing as directed. Secondary Dressing: Zetuvit Plus 4x8 in 3 x Per Week/30 Days Discharge Instructions: or other superabsorbant pad. Apply over primary dressing as directed. Com pression Wrap: Kerlix Roll 4.5x3.1 (in/yd) 3 x Per Week/30 Days Discharge Instructions: Apply Kerlix and Coban compression as directed. Com pression Wrap: Coban Self-Adherent Wrap 4x5 (in/yd) 3 x Per Week/30 Days Discharge Instructions: Apply over Kerlix as directed. WOUND #28: - Ankle Wound Laterality: Right, Lateral Cleanser: Anasept Antimicrobial Skin and Wound Cleanser, 8 (oz) 3 x Per Week/30 Days Discharge Instructions: Cleanse the wound with Anasept cleanser prior to applying [name] clean dressing using gauze sponges, not tissue or cotton balls. Peri-Wound Care: Zinc Oxide Ointment 30g tube 3 x Per Week/30 Days Discharge Instructions: Apply Zinc Oxide to periwound with each dressing change Peri-Wound Care: Sween Lotion (Moisturizing lotion) 3 x Per Week/30 Days Discharge Instructions: Apply moisturizing lotion as directed Topical: Gentamicin 3 x Per Week/30 Days Discharge Instructions: Apply directly to wound bed, under silver alginate Prim Dressing: KerraCel Ag Gelling Fiber Dressing, 4x5 in (silver alginate) 3 x Per Week/30 Days ary Discharge Instructions: Apply silver alginate to wound bed as instructed Secondary Dressing: ABD Pad, 8x10 3 x Per Week/30 Days Discharge Instructions: Apply over primary dressing as directed. Com pression Wrap: Kerlix Roll 4.5x3.1 (in/yd) 3 x Per Week/30 Days Discharge Instructions: Apply Kerlix and Coban compression as directed. Com pression Wrap: Coban Self-Adherent Wrap 4x5 (in/yd) 3 x Per Week/30 Days Discharge Instructions: Apply over Kerlix as directed. 1. Gentamicin with silver alginate under Kerlix/Coban 2. Levofloxacin 3. Follow-up in 1 week Electronic Signature(s) Signed: 11/13/2021 11:29:48 AM By: [name] DO Entered By: [name] on 11/13/2021 11:28:44 -------------------------------------------------------------------------------- HxROS Details Patient Name: Date of Service: [name], [name] 11/13/2021 9:30 [name] M Medical Record Number: [id number] Patient Account Number: [id number] Date of Birth/Sex: Treating RN: [birth year] (66 y.o. M) Primary Care Provider: [name] Other Clinician: Referring Provider: Treating Provider/Extender: [name] in Treatment: 5 Information Obtained From Patient Eyes Medical History: Negative for: Cataracts; Glaucoma; Optic Neuritis Ear/Nose/Mouth/Throat Medical History: Negative for: Chronic sinus problems/congestion; Middle ear problems Hematologic/Lymphatic Medical History: Positive for: Anemia - iron deficiency Negative for: Hemophilia; Human Immunodeficiency Virus; Lymphedema; Sickle Cell Disease Respiratory Medical History: Positive for: Sleep Apnea Negative for: Aspiration; Asthma; Chronic Obstructive Pulmonary Disease (COPD); Pneumothorax; Tuberculosis Cardiovascular Medical History: Positive for: Congestive Heart Failure; Coronary Artery Disease; Hypertension; Peripheral Venous Disease Negative for: Angina; Arrhythmia; Deep Vein Thrombosis; Hypotension; Myocardial Infarction; Peripheral Arterial Disease; Phlebitis; Vasculitis Gastrointestinal Medical History: Negative for: Cirrhosis ; Colitis; Crohns; Hepatitis [name]; Hepatitis B; Hepatitis C Past Medical History Notes: GERD Endocrine Medical History: Positive for: Type II Diabetes - uncontrolled Negative for: Type I Diabetes Time with diabetes: 1999 Treated with: Insulin Blood sugar tested every day: Yes T ested : 3x per day Blood sugar testing results: Breakfast: 81-200; Lunch: 180-210; Dinner: 200 Genitourinary Medical History: Negative for: End Stage Renal Disease Immunological Medical History: Negative for: Lupus Erythematosus;  Raynauds; Scleroderma Integumentary (Skin) Medical  History: Negative for: History of Burn Musculoskeletal Medical History: Positive for: Osteoarthritis Negative for: Gout; Rheumatoid Arthritis; Osteomyelitis Past Medical History Notes: degenerative righ thip Neurologic Medical History: Positive for: Neuropathy Negative for: Dementia; Quadriplegia; Paraplegia; Seizure Disorder Oncologic Medical History: Negative for: Received Chemotherapy; Received Radiation Psychiatric Medical History: Negative for: Anorexia/bulimia; Confinement Anxiety Past Medical History Notes: depression Immunizations Pneumococcal Vaccine: Received Pneumococcal Vaccination: Yes Received Pneumococcal Vaccination On or After 60th Birthday: No Implantable Devices None Hospitalization / Surgery History Type of Hospitalization/Surgery inguinal hernia repair with mesh right shoulder rotator cuff repair toe nail excision Sepsis 09/03/21-09/10/22 Family and Social History Cancer: No; Diabetes: Yes - Mother,Father,Siblings; Heart Disease: No; Hereditary Spherocytosis: No; Hypertension: Yes - Mother,Father; Kidney Disease: No; Lung Disease: No; Seizures: No; Stroke: No; Thyroid Problems: No; Tuberculosis: No; Current every day smoker - 4 cig per day; Marital Status - Separated; Alcohol Use: Never; Drug Use: Prior History - cocaine, [place] quit 2005; Caffeine Use: Moderate; Financial Concerns: No; Food, Clothing or Shelter Needs: No; Support System Lacking: No; Transportation Concerns: No Electronic Signature(s) Signed: 11/13/2021 11:29:48 AM By: [name] DO Entered By: [name] on 11/13/2021 11:15:09 -------------------------------------------------------------------------------- SuperBill Details Patient Name: Date of Service: [name], [name] 11/13/2021 Medical Record Number: [id number] Patient Account Number: [id number] Date of Birth/Sex: Treating RN: 18-Apr-1956 (65 y.o. M) Primary Care  Provider: [name] Other Clinician: Referring Provider: Treating Provider/Extender: [name] in Treatment: 5 Diagnosis Coding ICD-10 Codes Code Description [phone] Chronic venous hypertension (idiopathic) with ulcer and inflammation of bilateral lower extremity 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ed L97.912 Non-pressure chronic ulcer of unspecified part of right lower leg with fat layer exposed I89.0 Lymphedema, not elsewhere classified Facility Procedures CPT4 Code: [id number] Description: [phone] - WOUND CARE VISIT-LEV 5 EST PT Modifier: Quantity: 1 Physician Procedures : CPT4 Code Description Modifier [id number] - WC PHYS LEVEL 4 - EST PT ICD-10 Diagnosis Description I87.333 Chronic venous hypertension (idiopathic) with ulcer and inflammation of bilateral lower extremity L97.822 Non-pressure chronic ulcer of other  part of left lower leg with fat layer exposed L97.912 Non-pressure chronic ulcer of unspecified part of right lower leg with fat layer exposed I89.0 Lymphedema, not elsewhere classified Quantity: 1 Electronic Signature(s) Signed: 11/14/2021 5:31:46 PM By: Rhae Hammock RN Signed: 11/15/2021 9:03:37 AM By: [name] DO Previous Signature: 11/13/2021 11:29:48 AM Version By: [name] DO Entered By: Rhae Hammock on 11/13/2021 16:53:07

## 2021-11-16 ENCOUNTER — Other Ambulatory Visit: Payer: Self-pay

## 2021-11-16 ENCOUNTER — Inpatient Hospital Stay (HOSPITAL_COMMUNITY)
Admission: EM | Admit: 2021-11-16 | Discharge: 2021-11-20 | DRG: 683 | Disposition: A | Discharge status: Skilled Nursing Facility | Payer: No Typology Code available for payment source | Source: Skilled Nursing Facility | Attending: Internal Medicine | Admitting: Internal Medicine

## 2021-11-16 DIAGNOSIS — D563 Thalassemia minor: Secondary | ICD-10-CM | POA: Diagnosis present

## 2021-11-16 DIAGNOSIS — N400 Enlarged prostate without lower urinary tract symptoms: Secondary | ICD-10-CM | POA: Diagnosis present

## 2021-11-16 DIAGNOSIS — N179 Acute kidney failure, unspecified: Principal | ICD-10-CM | POA: Diagnosis present

## 2021-11-16 DIAGNOSIS — I130 Hypertensive heart and chronic kidney disease with heart failure and stage 1 through stage 4 chronic kidney disease, or unspecified chronic kidney disease: Secondary | ICD-10-CM | POA: Diagnosis present

## 2021-11-16 DIAGNOSIS — G4730 Sleep apnea, unspecified: Secondary | ICD-10-CM | POA: Diagnosis present

## 2021-11-16 DIAGNOSIS — F32A Depression, unspecified: Secondary | ICD-10-CM | POA: Diagnosis present

## 2021-11-16 DIAGNOSIS — E1122 Type 2 diabetes mellitus with diabetic chronic kidney disease: Secondary | ICD-10-CM | POA: Diagnosis present

## 2021-11-16 DIAGNOSIS — Z833 Family history of diabetes mellitus: Secondary | ICD-10-CM

## 2021-11-16 DIAGNOSIS — E1142 Type 2 diabetes mellitus with diabetic polyneuropathy: Secondary | ICD-10-CM | POA: Diagnosis present

## 2021-11-16 DIAGNOSIS — I5032 Chronic diastolic (congestive) heart failure: Secondary | ICD-10-CM | POA: Diagnosis present

## 2021-11-16 DIAGNOSIS — E871 Hypo-osmolality and hyponatremia: Secondary | ICD-10-CM | POA: Diagnosis present

## 2021-11-16 DIAGNOSIS — E440 Moderate protein-calorie malnutrition: Secondary | ICD-10-CM | POA: Diagnosis present

## 2021-11-16 DIAGNOSIS — G8929 Other chronic pain: Secondary | ICD-10-CM | POA: Diagnosis present

## 2021-11-16 DIAGNOSIS — Z6829 Body mass index (BMI) 29.0-29.9, adult: Secondary | ICD-10-CM

## 2021-11-16 DIAGNOSIS — E11649 Type 2 diabetes mellitus with hypoglycemia without coma: Secondary | ICD-10-CM | POA: Diagnosis present

## 2021-11-16 DIAGNOSIS — F17200 Nicotine dependence, unspecified, uncomplicated: Secondary | ICD-10-CM | POA: Diagnosis present

## 2021-11-16 DIAGNOSIS — G4733 Obstructive sleep apnea (adult) (pediatric): Secondary | ICD-10-CM | POA: Diagnosis present

## 2021-11-16 DIAGNOSIS — Z7984 Long term (current) use of oral hypoglycemic drugs: Secondary | ICD-10-CM

## 2021-11-16 DIAGNOSIS — Z794 Long term (current) use of insulin: Secondary | ICD-10-CM

## 2021-11-16 DIAGNOSIS — E7800 Pure hypercholesterolemia, unspecified: Secondary | ICD-10-CM | POA: Diagnosis present

## 2021-11-16 DIAGNOSIS — E663 Overweight: Secondary | ICD-10-CM | POA: Diagnosis present

## 2021-11-16 DIAGNOSIS — E875 Hyperkalemia: Secondary | ICD-10-CM | POA: Diagnosis present

## 2021-11-16 DIAGNOSIS — K219 Gastro-esophageal reflux disease without esophagitis: Secondary | ICD-10-CM | POA: Diagnosis present

## 2021-11-16 DIAGNOSIS — Z7982 Long term (current) use of aspirin: Secondary | ICD-10-CM

## 2021-11-16 DIAGNOSIS — Z79899 Other long term (current) drug therapy: Secondary | ICD-10-CM

## 2021-11-16 DIAGNOSIS — Z882 Allergy status to sulfonamides status: Secondary | ICD-10-CM

## 2021-11-16 DIAGNOSIS — E878 Other disorders of electrolyte and fluid balance, not elsewhere classified: Secondary | ICD-10-CM | POA: Diagnosis present

## 2021-11-16 DIAGNOSIS — Z20822 Contact with and (suspected) exposure to covid-19: Secondary | ICD-10-CM | POA: Diagnosis present

## 2021-11-16 DIAGNOSIS — E119 Type 2 diabetes mellitus without complications: Secondary | ICD-10-CM

## 2021-11-16 DIAGNOSIS — E876 Hypokalemia: Secondary | ICD-10-CM | POA: Diagnosis present

## 2021-11-16 DIAGNOSIS — F17210 Nicotine dependence, cigarettes, uncomplicated: Secondary | ICD-10-CM | POA: Diagnosis present

## 2021-11-16 DIAGNOSIS — N1831 Chronic kidney disease, stage 3a: Secondary | ICD-10-CM | POA: Diagnosis present

## 2021-11-16 LAB — CBC WITH DIFFERENTIAL/PLATELET
Abs Immature Granulocytes: 0.05 10*3/uL (ref 0.00–0.07)
Basophils Absolute: 0 10*3/uL (ref 0.0–0.1)
Basophils Relative: 0 %
Eosinophils Absolute: 0.1 10*3/uL (ref 0.0–0.5)
Eosinophils Relative: 1 %
HCT: 33.2 % — ABNORMAL LOW (ref 39.0–52.0)
Hemoglobin: 9.8 g/dL — ABNORMAL LOW (ref 13.0–17.0)
Immature Granulocytes: 1 %
Lymphocytes Relative: 11 %
Lymphs Abs: 0.8 10*3/uL (ref 0.7–4.0)
MCH: 19.3 pg — ABNORMAL LOW (ref 26.0–34.0)
MCHC: 29.5 g/dL — ABNORMAL LOW (ref 30.0–36.0)
MCV: 65.2 fL — ABNORMAL LOW (ref 80.0–100.0)
Monocytes Absolute: 0.4 10*3/uL (ref 0.1–1.0)
Monocytes Relative: 6 %
Neutro Abs: 5.9 10*3/uL (ref 1.7–7.7)
Neutrophils Relative %: 81 %
Platelets: 264 10*3/uL (ref 150–400)
RBC: 5.09 MIL/uL (ref 4.22–5.81)
RDW: 18.9 % — ABNORMAL HIGH (ref 11.5–15.5)
WBC: 7.2 10*3/uL (ref 4.0–10.5)
nRBC: 0 % (ref 0.0–0.2)

## 2021-11-16 LAB — COMPREHENSIVE METABOLIC PANEL
ALT: 13 U/L (ref 0–44)
AST: 16 U/L (ref 15–41)
Albumin: 2.7 g/dL — ABNORMAL LOW (ref 3.5–5.0)
Alkaline Phosphatase: 78 U/L (ref 38–126)
Anion gap: 16 — ABNORMAL HIGH (ref 5–15)
BUN: 91 mg/dL — ABNORMAL HIGH (ref 8–23)
CO2: 25 mmol/L (ref 22–32)
Calcium: 9.6 mg/dL (ref 8.9–10.3)
Chloride: 93 mmol/L — ABNORMAL LOW (ref 98–111)
Creatinine, Ser: 4.84 mg/dL — ABNORMAL HIGH (ref 0.61–1.24)
GFR, Estimated: 12 mL/min — ABNORMAL LOW (ref >60)
Glucose, Bld: 116 mg/dL — ABNORMAL HIGH (ref 70–99)
Potassium: 6.9 mmol/L (ref 3.5–5.1)
Sodium: 134 mmol/L — ABNORMAL LOW (ref 135–145)
Total Bilirubin: 0.2 mg/dL — ABNORMAL LOW (ref 0.3–1.2)
Total Protein: 8.3 g/dL — ABNORMAL HIGH (ref 6.5–8.1)

## 2021-11-16 LAB — RESP PANEL BY RT-PCR (FLU A&B, COVID) ARPGX2
Influenza A by PCR: NEGATIVE
Influenza B by PCR: NEGATIVE
SARS Coronavirus 2 by RT PCR: NEGATIVE

## 2021-11-16 LAB — LIPASE, BLOOD: Lipase: 22 U/L (ref 11–51)

## 2021-11-16 NOTE — ED Provider Triage Note (Addendum)
Emergency Medicine Provider Triage Evaluation Note  [name] , a 66 y.o. male  was evaluated in triage.  Pt complains of vomiting.  Current resident at [facility].  Denies abdominal pain, fever, chills, or diarrhea.  No one else sick at facility.  States when he vomited it came out his nose and choked him up.  He is feeling a little SOB afterwards and wearing his home O2 (usually only wears at night).  Review of Systems  Positive: Vomiting, SOB Negative: Fever, chills  Physical Exam  BP 114/67    Pulse 85    Temp 98.6 F (37 C) (Oral)    Resp 18    SpO2 99%  Gen:   Awake, no distress   Resp:  Normal effort, 2L in use MSK:   Moves extremities without difficulty  Other:  Abdomen soft, no appreciable tenderness  Medical Decision Making  Medically screening exam initiated at 10:27 PM.  Appropriate orders placed.  Ocie Tino was informed that the remainder of the evaluation will be completed by another provider, this initial triage assessment does not replace that evaluation, and the importance of remaining in the ED until their evaluation is complete.  Vomiting.  Choked afterwards and now SOB on home O2.  Will obtain labs, covid screen, and acute abd series w/chest to assess for possible aspiration.  11:15 PM Notified by lab that K+ is 6.9 without hemolysis.  Also has findings of new ARF today.  Charge RN notified, will find an acute bed.   Larene Pickett, PA-C 11/16/21 2229    Larene Pickett, PA-C 11/16/21 2316

## 2021-11-16 NOTE — ED Triage Notes (Signed)
Pt brought to ED triage by [facility] via wheelchair with c/o emesis after eating dinner today. Pt states he felt like he choked after vomiting. Denies any CP or shob.

## 2021-11-17 ENCOUNTER — Encounter (HOSPITAL_COMMUNITY): Payer: Self-pay | Admitting: Family Medicine

## 2021-11-17 ENCOUNTER — Emergency Department (HOSPITAL_COMMUNITY): Payer: No Typology Code available for payment source

## 2021-11-17 DIAGNOSIS — K219 Gastro-esophageal reflux disease without esophagitis: Secondary | ICD-10-CM | POA: Diagnosis present

## 2021-11-17 DIAGNOSIS — E875 Hyperkalemia: Secondary | ICD-10-CM | POA: Diagnosis present

## 2021-11-17 DIAGNOSIS — I130 Hypertensive heart and chronic kidney disease with heart failure and stage 1 through stage 4 chronic kidney disease, or unspecified chronic kidney disease: Secondary | ICD-10-CM | POA: Diagnosis present

## 2021-11-17 DIAGNOSIS — N179 Acute kidney failure, unspecified: Secondary | ICD-10-CM | POA: Diagnosis present

## 2021-11-17 DIAGNOSIS — E11649 Type 2 diabetes mellitus with hypoglycemia without coma: Secondary | ICD-10-CM | POA: Diagnosis present

## 2021-11-17 DIAGNOSIS — E878 Other disorders of electrolyte and fluid balance, not elsewhere classified: Secondary | ICD-10-CM | POA: Diagnosis present

## 2021-11-17 DIAGNOSIS — E871 Hypo-osmolality and hyponatremia: Secondary | ICD-10-CM

## 2021-11-17 DIAGNOSIS — Z833 Family history of diabetes mellitus: Secondary | ICD-10-CM | POA: Diagnosis not present

## 2021-11-17 DIAGNOSIS — G4733 Obstructive sleep apnea (adult) (pediatric): Secondary | ICD-10-CM | POA: Diagnosis present

## 2021-11-17 DIAGNOSIS — E440 Moderate protein-calorie malnutrition: Secondary | ICD-10-CM

## 2021-11-17 DIAGNOSIS — G8929 Other chronic pain: Secondary | ICD-10-CM | POA: Diagnosis present

## 2021-11-17 DIAGNOSIS — E1142 Type 2 diabetes mellitus with diabetic polyneuropathy: Secondary | ICD-10-CM

## 2021-11-17 DIAGNOSIS — N400 Enlarged prostate without lower urinary tract symptoms: Secondary | ICD-10-CM | POA: Diagnosis present

## 2021-11-17 DIAGNOSIS — F17210 Nicotine dependence, cigarettes, uncomplicated: Secondary | ICD-10-CM | POA: Diagnosis present

## 2021-11-17 DIAGNOSIS — G4730 Sleep apnea, unspecified: Secondary | ICD-10-CM | POA: Diagnosis present

## 2021-11-17 DIAGNOSIS — I5032 Chronic diastolic (congestive) heart failure: Secondary | ICD-10-CM

## 2021-11-17 DIAGNOSIS — E7800 Pure hypercholesterolemia, unspecified: Secondary | ICD-10-CM | POA: Diagnosis present

## 2021-11-17 DIAGNOSIS — D563 Thalassemia minor: Secondary | ICD-10-CM | POA: Diagnosis present

## 2021-11-17 DIAGNOSIS — Z20822 Contact with and (suspected) exposure to covid-19: Secondary | ICD-10-CM | POA: Diagnosis present

## 2021-11-17 DIAGNOSIS — E663 Overweight: Secondary | ICD-10-CM | POA: Diagnosis present

## 2021-11-17 DIAGNOSIS — E876 Hypokalemia: Secondary | ICD-10-CM | POA: Diagnosis present

## 2021-11-17 DIAGNOSIS — F32A Depression, unspecified: Secondary | ICD-10-CM | POA: Diagnosis present

## 2021-11-17 DIAGNOSIS — E1122 Type 2 diabetes mellitus with diabetic chronic kidney disease: Secondary | ICD-10-CM | POA: Diagnosis present

## 2021-11-17 DIAGNOSIS — N1831 Chronic kidney disease, stage 3a: Secondary | ICD-10-CM | POA: Diagnosis present

## 2021-11-17 LAB — CBG MONITORING, ED
Glucose-Capillary: 102 mg/dL — ABNORMAL HIGH (ref 70–99)
Glucose-Capillary: 43 mg/dL — CL (ref 70–99)
Glucose-Capillary: 128 mg/dL — ABNORMAL HIGH (ref 70–99)
Glucose-Capillary: 122 mg/dL — ABNORMAL HIGH (ref 70–99)

## 2021-11-17 LAB — CBC
HCT: 34 % — ABNORMAL LOW (ref 39.0–52.0)
Hemoglobin: 9.8 g/dL — ABNORMAL LOW (ref 13.0–17.0)
MCH: 19 pg — ABNORMAL LOW (ref 26.0–34.0)
MCHC: 28.8 g/dL — ABNORMAL LOW (ref 30.0–36.0)
MCV: 65.8 fL — ABNORMAL LOW (ref 80.0–100.0)
Platelets: 261 10*3/uL (ref 150–400)
RBC: 5.17 MIL/uL (ref 4.22–5.81)
RDW: 19.4 % — ABNORMAL HIGH (ref 11.5–15.5)
WBC: 7.8 10*3/uL (ref 4.0–10.5)
nRBC: 0 % (ref 0.0–0.2)

## 2021-11-17 LAB — URINALYSIS, ROUTINE W REFLEX MICROSCOPIC
Bilirubin Urine: NEGATIVE
Glucose, UA: NEGATIVE mg/dL
Hgb urine dipstick: NEGATIVE
Ketones, ur: NEGATIVE mg/dL
Leukocytes,Ua: NEGATIVE
Nitrite: NEGATIVE
Protein, ur: NEGATIVE mg/dL
Specific Gravity, Urine: 1.012 (ref 1.005–1.030)
pH: 5 (ref 5.0–8.0)

## 2021-11-17 LAB — BASIC METABOLIC PANEL
Anion gap: 12 (ref 5–15)
BUN: 87 mg/dL — ABNORMAL HIGH (ref 8–23)
CO2: 25 mmol/L (ref 22–32)
Calcium: 9.5 mg/dL (ref 8.9–10.3)
Chloride: 99 mmol/L (ref 98–111)
Creatinine, Ser: 4.1 mg/dL — ABNORMAL HIGH (ref 0.61–1.24)
GFR, Estimated: 15 mL/min — ABNORMAL LOW (ref >60)
Glucose, Bld: 70 mg/dL (ref 70–99)
Potassium: 5.9 mmol/L — ABNORMAL HIGH (ref 3.5–5.1)
Sodium: 136 mmol/L (ref 135–145)

## 2021-11-17 LAB — HEMOGLOBIN A1C
Hgb A1c MFr Bld: 8.2 % — ABNORMAL HIGH (ref 4.8–5.6)
Mean Plasma Glucose: 188.64 mg/dL

## 2021-11-17 LAB — GLUCOSE, CAPILLARY: Glucose-Capillary: 128 mg/dL — ABNORMAL HIGH (ref 70–99)

## 2021-11-17 MED ORDER — TAMSULOSIN HCL 0.4 MG PO CAPS
0.8000 mg | ORAL_CAPSULE | Freq: Every day | ORAL | Status: DC
  Administered 2021-11-17 – 2021-11-19 (×3): 0.8 mg via ORAL
  Filled 2021-11-17 (×3): qty 2

## 2021-11-17 MED ORDER — ACETAMINOPHEN 325 MG PO TABS
650.0000 mg | ORAL_TABLET | Freq: Four times a day (QID) | ORAL | Status: DC | PRN
  Administered 2021-11-18 – 2021-11-20 (×5): 650 mg via ORAL
  Filled 2021-11-17 (×5): qty 2

## 2021-11-17 MED ORDER — PROSOURCE PLUS PO LIQD: 30.0000 mL | Freq: Every day | ORAL | Status: DC

## 2021-11-17 MED ORDER — BENZOCAINE 10 % MT GEL: 1.0000 "application " | OROMUCOSAL | Status: DC | PRN

## 2021-11-17 MED ORDER — TRAZODONE HCL 50 MG PO TABS: 25.0000 mg | ORAL_TABLET | Freq: Every evening | ORAL | Status: DC | PRN

## 2021-11-17 MED ORDER — ARIPIPRAZOLE 2 MG PO TABS
2.0000 mg | ORAL_TABLET | Freq: Every day | ORAL | Status: DC
  Administered 2021-11-17 – 2021-11-19 (×3): 2 mg via ORAL
  Filled 2021-11-17 (×4): qty 1

## 2021-11-17 MED ORDER — ONDANSETRON HCL 4 MG/2ML IJ SOLN
4.0000 mg | Freq: Four times a day (QID) | INTRAMUSCULAR | Status: DC | PRN
Start: 2021-11-17 — End: 2021-11-20

## 2021-11-17 MED ORDER — TIZANIDINE HCL 2 MG PO TABS
4.0000 mg | ORAL_TABLET | Freq: Three times a day (TID) | ORAL | Status: DC | PRN
  Administered 2021-11-18 – 2021-11-19 (×3): 4 mg via ORAL
  Filled 2021-11-17 (×3): qty 2

## 2021-11-17 MED ORDER — HYDROCORTISONE (PERIANAL) 2.5 % EX CREA: 1.0000 "application " | TOPICAL_CREAM | Freq: Every day | CUTANEOUS | Status: DC | PRN

## 2021-11-17 MED ORDER — POLYETHYLENE GLYCOL 3350 17 G PO PACK: 17.0000 g | PACK | Freq: Every day | ORAL | Status: DC | PRN

## 2021-11-17 MED ORDER — BETAMETHASONE VALERATE 0.1 % EX LOTN: TOPICAL_LOTION | Freq: Two times a day (BID) | CUTANEOUS | Status: DC | PRN

## 2021-11-17 MED ORDER — ONDANSETRON HCL 4 MG PO TABS: 4.0000 mg | ORAL_TABLET | Freq: Four times a day (QID) | ORAL | Status: DC | PRN

## 2021-11-17 MED ORDER — INSULIN GLARGINE-YFGN 100 UNIT/ML ~~LOC~~ SOLN
30.0000 [IU] | Freq: Two times a day (BID) | SUBCUTANEOUS | Status: DC
  Administered 2021-11-17 – 2021-11-18 (×3): 30 [IU] via SUBCUTANEOUS
  Filled 2021-11-17 (×6): qty 0.3

## 2021-11-17 MED ORDER — FUROSEMIDE 10 MG/ML IJ SOLN
40.0000 mg | Freq: Once | INTRAMUSCULAR | Status: AC
  Administered 2021-11-17: 40 mg via INTRAVENOUS
  Filled 2021-11-17: qty 4

## 2021-11-17 MED ORDER — VILAZODONE HCL 20 MG PO TABS
40.0000 mg | ORAL_TABLET | Freq: Every day | ORAL | Status: DC
  Administered 2021-11-17 – 2021-11-20 (×4): 40 mg via ORAL
  Filled 2021-11-17 (×4): qty 2

## 2021-11-17 MED ORDER — GABAPENTIN 300 MG PO CAPS
300.0000 mg | ORAL_CAPSULE | Freq: Three times a day (TID) | ORAL | Status: DC
  Administered 2021-11-17 – 2021-11-18 (×3): 300 mg via ORAL
  Filled 2021-11-17 (×3): qty 1

## 2021-11-17 MED ORDER — DOCUSATE SODIUM 100 MG PO CAPS: 100.0000 mg | ORAL_CAPSULE | Freq: Two times a day (BID) | ORAL | Status: DC | PRN

## 2021-11-17 MED ORDER — CHLORHEXIDINE GLUCONATE 0.12 % MT SOLN
5.0000 mL | Freq: Two times a day (BID) | OROMUCOSAL | Status: DC
  Administered 2021-11-17 – 2021-11-20 (×7): 5 mL via OROMUCOSAL
  Filled 2021-11-17 (×8): qty 15

## 2021-11-17 MED ORDER — LINAGLIPTIN 5 MG PO TABS
5.0000 mg | ORAL_TABLET | Freq: Every morning | ORAL | Status: DC
  Administered 2021-11-17 – 2021-11-20 (×3): 5 mg via ORAL
  Filled 2021-11-17 (×3): qty 1

## 2021-11-17 MED ORDER — ENSURE MAX PROTEIN PO LIQD: 11.0000 [oz_av] | Freq: Every day | ORAL | Status: DC

## 2021-11-17 MED ORDER — ACETAMINOPHEN 650 MG RE SUPP: 650.0000 mg | Freq: Four times a day (QID) | RECTAL | Status: DC | PRN

## 2021-11-17 MED ORDER — PANTOPRAZOLE SODIUM 40 MG PO TBEC
40.0000 mg | DELAYED_RELEASE_TABLET | Freq: Every day | ORAL | Status: DC
Start: 2021-11-17 — End: 2021-11-20
  Administered 2021-11-17 – 2021-11-20 (×4): 40 mg via ORAL
  Filled 2021-11-17 (×4): qty 1

## 2021-11-17 MED ORDER — TRAZODONE HCL 50 MG PO TABS
50.0000 mg | ORAL_TABLET | Freq: Every day | ORAL | Status: DC
  Administered 2021-11-17 – 2021-11-19 (×3): 50 mg via ORAL
  Filled 2021-11-17 (×3): qty 1

## 2021-11-17 MED ORDER — DEXTROSE 50 % IV SOLN
INTRAVENOUS | Status: AC
  Filled 2021-11-17: qty 50

## 2021-11-17 MED ORDER — ADULT MULTIVITAMIN W/MINERALS CH
1.0000 | ORAL_TABLET | Freq: Every morning | ORAL | Status: DC
  Administered 2021-11-17 – 2021-11-20 (×4): 1 via ORAL
  Filled 2021-11-17 (×3): qty 1

## 2021-11-17 MED ORDER — INSULIN ASPART 100 UNIT/ML IJ SOLN
0.0000 [IU] | Freq: Three times a day (TID) | INTRAMUSCULAR | Status: DC
  Administered 2021-11-17: 1 [IU] via SUBCUTANEOUS
  Administered 2021-11-19: 2 [IU] via SUBCUTANEOUS

## 2021-11-17 MED ORDER — INSULIN ASPART 100 UNIT/ML IV SOLN
5.0000 [IU] | Freq: Once | INTRAVENOUS | Status: AC
  Administered 2021-11-17: 5 [IU] via INTRAVENOUS

## 2021-11-17 MED ORDER — FERROUS SULFATE 325 (65 FE) MG PO TABS
325.0000 mg | ORAL_TABLET | Freq: Every morning | ORAL | Status: DC
Start: 2021-11-17 — End: 2021-11-20
  Administered 2021-11-17 – 2021-11-20 (×4): 325 mg via ORAL
  Filled 2021-11-17 (×4): qty 1

## 2021-11-17 MED ORDER — GLUCAGON EMERGENCY 1 MG/ML IJ SOLR: 1.0000 mg | Freq: Once | INTRAMUSCULAR | Status: DC | PRN

## 2021-11-17 MED ORDER — CALCIUM GLUCONATE-NACL 1-0.675 GM/50ML-% IV SOLN
1.0000 g | Freq: Once | INTRAVENOUS | Status: AC
  Administered 2021-11-17: 1000 mg via INTRAVENOUS
  Filled 2021-11-17: qty 50

## 2021-11-17 MED ORDER — SODIUM CHLORIDE 0.9 % IV SOLN: INTRAVENOUS | Status: DC

## 2021-11-17 MED ORDER — BETAMETHASONE DIPROPIONATE 0.05 % EX LOTN: 1.0000 "application " | TOPICAL_LOTION | Freq: Two times a day (BID) | CUTANEOUS | Status: DC | PRN

## 2021-11-17 MED ORDER — BASAGLAR KWIKPEN 100 UNIT/ML ~~LOC~~ SOPN: 30.0000 [IU] | PEN_INJECTOR | Freq: Two times a day (BID) | SUBCUTANEOUS | Status: DC

## 2021-11-17 MED ORDER — ENOXAPARIN SODIUM 30 MG/0.3ML IJ SOSY
30.0000 mg | PREFILLED_SYRINGE | INTRAMUSCULAR | Status: DC
  Administered 2021-11-17 – 2021-11-18 (×2): 30 mg via SUBCUTANEOUS
  Filled 2021-11-17 (×2): qty 0.3

## 2021-11-17 MED ORDER — VILAZODONE HCL 40 MG PO TABS: 40.0000 mg | ORAL_TABLET | Freq: Every morning | ORAL | Status: DC

## 2021-11-17 MED ORDER — SODIUM ZIRCONIUM CYCLOSILICATE 10 G PO PACK
10.0000 g | PACK | Freq: Once | ORAL | Status: AC
  Administered 2021-11-17: 10 g via ORAL
  Filled 2021-11-17: qty 1

## 2021-11-17 MED ORDER — MELATONIN 3 MG PO TABS
3.0000 mg | ORAL_TABLET | Freq: Every day | ORAL | Status: DC
  Administered 2021-11-17 – 2021-11-19 (×3): 3 mg via ORAL
  Filled 2021-11-17 (×3): qty 1

## 2021-11-17 MED ORDER — GLUCERNA SHAKE PO LIQD: 237.0000 mL | ORAL | Status: DC

## 2021-11-17 MED ORDER — NITROGLYCERIN 0.4 MG SL SUBL: 0.4000 mg | SUBLINGUAL_TABLET | SUBLINGUAL | Status: DC | PRN

## 2021-11-17 MED ORDER — MAGNESIUM HYDROXIDE 400 MG/5ML PO SUSP: 30.0000 mL | Freq: Every day | ORAL | Status: DC | PRN

## 2021-11-17 MED ORDER — PROSOURCE PLUS PO LIQD
60.0000 mL | Freq: Two times a day (BID) | ORAL | Status: DC
  Administered 2021-11-17 – 2021-11-20 (×7): 60 mL via ORAL
  Filled 2021-11-17 (×7): qty 60

## 2021-11-17 MED ORDER — DEXTROSE 50 % IV SOLN
1.0000 | Freq: Once | INTRAVENOUS | Status: AC
  Administered 2021-11-17: 50 mL via INTRAVENOUS
  Filled 2021-11-17: qty 50

## 2021-11-17 MED ORDER — PROSOURCE NO CARB PO LIQD
45.0000 mL | Freq: Two times a day (BID) | ORAL | Status: DC
  Filled 2021-11-17: qty 60

## 2021-11-17 MED ORDER — VITAMIN D 25 MCG (1000 UNIT) PO TABS: 2000.0000 [IU] | ORAL_TABLET | Freq: Every morning | ORAL | Status: DC

## 2021-11-17 MED ORDER — VITAMIN D 25 MCG (1000 UNIT) PO TABS
2000.0000 [IU] | ORAL_TABLET | Freq: Every morning | ORAL | Status: DC
  Administered 2021-11-17 – 2021-11-20 (×4): 2000 [IU] via ORAL
  Filled 2021-11-17 (×4): qty 2

## 2021-11-17 NOTE — Plan of Care (Signed)

## 2021-11-17 NOTE — Assessment & Plan Note (Addendum)
Appears dry.  BNP within normal limits

## 2021-11-17 NOTE — ED Notes (Signed)
Pharmacy technician called pt's facility twice for pt's [facility] to be faxed to [facility] in order for medications to be verified. Per pharmacy technician, facility staff stated that they would fax aforementioned paperwork, but it appears as though it has not been faxed yet. Pharmacy technician attempted to call facility a second time for [facility], to which there was no answer.

## 2021-11-17 NOTE — Assessment & Plan Note (Addendum)
Treated with Lokelma.  At 5.0 on 2/27

## 2021-11-17 NOTE — ED Notes (Signed)
RN messaged pharmacy for verification of due medications °

## 2021-11-17 NOTE — ED Provider Notes (Signed)
[name]   CSN: [id number] Arrival date & time: 11/16/21  2156     History  Chief Complaint  Patient presents with   Emesis    [name] is a 66 y.o. male.  The history is provided by the patient.  Emesis Duration:  5 hours Timing:  Intermittent Progression:  Worsening Chronicity:  New Relieved by:  Nothing Worsened by:  Nothing Associated symptoms: no abdominal pain, no diarrhea and no fever   Patient with history of CHF, depression, hypertension, diabetes presents with generalized weakness and vomiting.  Patient reports for the past several hours has had multiple episodes of nonbloody emesis.  No diarrhea.  No abdominal pain.  No new medicines.  He had otherwise been at his baseline.  He does report shortness of breath but no chest pain    Past Medical History:  Diagnosis Date   Anemia    Arthritis    "left hip" (04/26/2015)   Cellulitis and abscess of leg hositalized 04/25/2015   left   Chronic diastolic CHF (congestive heart failure) ([place]) 06/25/2021   Chronic hip pain    Depression    GERD (gastroesophageal reflux disease)    Hypercholesterolemia    Hypertension    Pneumonia 1960's X 1   Primary skin malignancy with unknown cell type 12/10/2016   Sleep apnea    "couldn't wear the mask" (04/26/2015)   Thalassemia minor    Type II diabetes mellitus ([place])     Home Medications Prior to Admission medications   Medication Sig Start Date End Date Taking? Authorizing Provider  acetaminophen (TYLENOL) 325 MG tablet Take 2 tablets (650 mg total) by mouth every 6 (six) hours as needed for mild pain (or Fever >/= 101). 03/23/15   Robbie Lis, MD  Amino Acids-Protein Hydrolys (FEEDING SUPPLEMENT, PRO-STAT 64,) LIQD Take 45 mLs by mouth 2 (two) times daily.    [provider]  ARIPiprazole (ABILIFY) 2 MG tablet Take 2 mg by mouth at bedtime. 04/27/21   [provider]  aspirin EC 81 MG tablet Take 81 mg  by mouth every morning.    [provider]  benzocaine (ORAJEL) 10 % mucosal gel Use as directed 1 application in the mouth or throat every 2 (two) hours as needed for mouth pain (soreness).    [provider]  betamethasone dipropionate 0.05 % lotion Apply 1 application topically every 12 (twelve) hours as needed (itching). 06/07/21   [provider]  chlorhexidine (PERIDEX) 0.12 % solution Use as directed 5 mLs in the mouth or throat 2 (two) times daily.    [provider]  Cholecalciferol (VITAMIN D) 50 MCG (2000 UT) tablet Take 2,000 Units by mouth every morning.    [provider]  docusate sodium (COLACE) 100 MG capsule Take 1 capsule (100 mg total) by mouth 2 (two) times daily as needed for mild constipation. 09/10/21   Pokhrel, Corrie Mckusick, MD  Ensure Max Protein (ENSURE MAX PROTEIN) LIQD Take 330 mLs (11 oz total) by mouth daily. 09/10/21   Pokhrel, Corrie Mckusick, MD  feeding supplement, GLUCERNA SHAKE, (GLUCERNA SHAKE) LIQD Take 237 mLs by mouth daily. 09/11/21   Pokhrel, Corrie Mckusick, MD  ferrous sulfate 325 (65 FE) MG tablet Take 325 mg by mouth every morning.    [provider]  furosemide (LASIX) 20 MG tablet Take 60 mg by mouth 2 (two) times daily. 05/16/21   [provider]  gabapentin (NEURONTIN) 300 MG capsule Take 300  mg by mouth 3 (three) times daily. 05/16/21   [provider]  Glucagon HCl (GLUCAGON EMERGENCY) 1 MG/ML SOLR Inject 1 mg into the muscle once as needed (hypoglycemia).    [provider]  hydrocortisone (ANUSOL-HC) 2.5 % rectal cream Place 1 application rectally daily as needed for hemorrhoids or anal itching.    [provider]  Insulin Glargine (BASAGLAR KWIKPEN) 100 UNIT/ML Inject 30 Units into the skin 2 (two) times daily. 06/11/21   [provider]  insulin regular (NOVOLIN R) 100 units/mL injection Inject 2-10 Units into the skin See admin instructions. Inject 2-10 units subcutaneously  four times daily per sliding scale: CBG 140-199 2 units, 200-250 4 units, 251-299 6 units, 300-349 8 units, 350-400 10 units, above 400 call MD for coverage    [provider]  linagliptin (TRADJENTA) 5 MG TABS tablet Take 5 mg by mouth every morning.    [provider]  lisinopril (PRINIVIL,ZESTRIL) 2.5 MG tablet Take 1 tablet (2.5 mg total) by mouth daily. Patient taking differently: Take 2.5 mg by mouth every morning. 11/13/15   Thurnell Lose, MD  melatonin 3 MG TABS tablet Take 3 mg by mouth at bedtime.    [provider]  metFORMIN (GLUCOPHAGE) 500 MG tablet Take 500 mg by mouth 2 (two) times daily with a meal. 10/15/17   [provider]  Multiple Vitamin (MULTIVITAMIN WITH MINERALS) TABS tablet Take 1 tablet by mouth daily. Patient taking differently: Take 1 tablet by mouth every morning. 03/23/15   Robbie Lis, MD  nitroGLYCERIN (NITROSTAT) 0.4 MG SL tablet Place 0.4 mg under the tongue every 5 (five) minutes as needed for chest pain.    [provider]  Nutritional Supplements (,FEEDING SUPPLEMENT, PROSOURCE PLUS) liquid Take 30 mLs by mouth daily. 09/10/21   Pokhrel, Corrie Mckusick, MD  nystatin (MYCOSTATIN/NYSTOP) powder Apply topically 2 (two) times daily. Apply to folds 09/10/21   Pokhrel, Laxman, MD  pantoprazole (PROTONIX) 40 MG tablet Take 1 tablet (40 mg total) by mouth daily. 09/11/21   Pokhrel, Corrie Mckusick, MD  polyethylene glycol (MIRALAX / GLYCOLAX) 17 g packet Take 17 g by mouth daily as needed for moderate constipation. 09/10/21   Pokhrel, Corrie Mckusick, MD  Psyllium (METAMUCIL FIBER SINGLES PO) Take 2 Wafers by mouth 2 (two) times daily.    [provider]  tamsulosin (FLOMAX) 0.4 MG CAPS capsule Take 0.8 mg by mouth at bedtime.    [provider]  tiZANidine (ZANAFLEX) 4 MG tablet Take 4 mg by mouth every 8 (eight) hours as needed for muscle spasms.    [provider]  traZODone (DESYREL) 50 MG tablet Take 50 mg by mouth  at bedtime. 06/08/21   [provider]  triamcinolone cream (KENALOG) 0.1 % Apply 1 application topically 2 (two) times daily. For rash 06/18/21   [provider]  Vilazodone HCl (VIIBRYD) 40 MG TABS Take 40 mg by mouth every morning.    [provider]      Allergies    Other and Sulfa antibiotics    Review of Systems   Review of Systems  Constitutional:  Positive for fatigue. Negative for fever.  Respiratory:  Positive for shortness of breath.   Cardiovascular:  Negative for chest pain.  Gastrointestinal:  Positive for vomiting. Negative for abdominal pain, constipation and diarrhea.  All other systems reviewed and are negative.  Physical Exam Updated Vital Signs BP 119/72    Pulse 80    Temp 98.6 F (37  C) (Oral)    Resp 18    SpO2 100%  Physical Exam CONSTITUTIONAL: Chronically ill-appearing HEAD: Normocephalic/atraumatic EYES: EOMI ENMT: Mucous membranes moist NECK: supple no meningeal signs CV: S1/S2 noted, no murmurs/rubs/gallops noted LUNGS: Lungs are clear to auscultation bilaterally, no apparent distress ABDOMEN: soft, nontender, no rebound or guarding, bowel sounds noted throughout abdomen GU:no cva tenderness NEURO: Pt is awake/alert/appropriate, moves all extremitiesx4.  No facial droop.  Patient appears tremulous EXTREMITIES: pulses normal/equal, full ROM Bandages noted to both feet SKIN: warm, color normal PSYCH: no abnormalities of mood noted, alert and oriented to situation  ED Results / Procedures / Treatments   Labs (all labs ordered are listed, but only abnormal results are displayed) Labs Reviewed  CBC WITH DIFFERENTIAL/PLATELET - Abnormal; Notable for the following components:      Result Value   Hemoglobin 9.8 (*)    HCT 33.2 (*)    MCV 65.2 (*)    MCH 19.3 (*)    MCHC 29.5 (*)    RDW 18.9 (*)    All other components within normal limits  COMPREHENSIVE METABOLIC PANEL - Abnormal; Notable for the following components:    Sodium 134 (*)    Potassium 6.9 (*)    Chloride 93 (*)    Glucose, Bld 116 (*)    BUN 91 (*)    Creatinine, Ser 4.84 (*)    Total Protein 8.3 (*)    Albumin 2.7 (*)    Total Bilirubin 0.2 (*)    GFR, Estimated 12 (*)    Anion gap 16 (*)    All other components within normal limits  RESP PANEL BY RT-PCR (FLU A&B, COVID) ARPGX2  LIPASE, BLOOD  URINALYSIS, ROUTINE W REFLEX MICROSCOPIC  BASIC METABOLIC PANEL  CBC    EKG EKG Interpretation  Date/Time:  Saturday November 17 2021 00:02:53 EST Ventricular Rate:  85 PR Interval:  166 QRS Duration: 91 QT Interval:  354 QTC Calculation: 421 R Axis:   78 Text Interpretation: Sinus rhythm Confirmed by Ripley Fraise [phone]) on 11/17/2021 12:14:12 AM  Radiology DG Chest Port 1 View  Result Date: 11/17/2021 CLINICAL DATA:  Vomiting EXAM: PORTABLE CHEST 1 VIEW COMPARISON:  10/24/2021 FINDINGS: Stable elevation the right hemidiaphragm. Mild cardiomegaly. No confluent opacities or effusions. No acute bony abnormality. IMPRESSION: No active disease. Electronically Signed   By: Rolm Baptise M.D.   On: 11/17/2021 00:39    Procedures .Critical Care Performed by: Ripley Fraise, MD Authorized by: Ripley Fraise, MD   Critical care provider statement:    Critical care time (minutes):  60   Critical care start time:  11/17/2021 12:41 AM   Critical care end time:  11/17/2021 1:41 AM   Critical care time was exclusive of:  Separately billable procedures and treating other patients   Critical care was necessary to treat or prevent imminent or life-threatening deterioration of the following conditions:  Renal failure, dehydration and metabolic crisis   Critical care was time spent personally by me on the following activities:  Development of treatment plan with patient or surrogate, evaluation of patient's response to treatment, re-evaluation of patient's condition, pulse oximetry, ordering and review of radiographic studies, ordering and review  of laboratory studies, ordering and performing treatments and interventions, review of old charts and examination of patient   I assumed direction of critical care for this patient from another provider in my specialty: no     Care discussed with: admitting provider      Medications Ordered  in ED Medications  calcium gluconate 1 g/ 50 mL sodium chloride IVPB (1,000 mg Intravenous New Bag/Given 11/17/21 0036)  insulin aspart (novoLOG) injection 5 Units (5 Units Intravenous Given 11/17/21 0026)    And  dextrose 50 % solution 50 mL (50 mLs Intravenous Given 11/17/21 0026)  sodium zirconium cyclosilicate (LOKELMA) packet 10 g (10 g Oral Given 11/17/21 0041)  furosemide (LASIX) injection 40 mg (40 mg Intravenous Given 11/17/21 0036)    ED Course/ Medical Decision Making/ A&P Clinical Course as of 11/17/21 0043  Sat Nov 17, 2021  0039 Potassium(!!): 6.9 Hyperkalemia [DW]  0039 Creatinine(!): 4.84 Acute renal failure noted [DW]    Clinical Course User Index [DW] Ripley Fraise, MD                           Medical Decision Making Amount and/or Complexity of Data Reviewed Labs:  Decision-making details documented in ED Course. Radiology: ordered.  Risk OTC drugs. Prescription drug management. Decision regarding hospitalization.   This patient presents to the ED for concern of vomiting, this involves an extensive number of treatment options, and is a complaint that carries with it a high risk of complications and morbidity.  The differential diagnosis includes metabolic derangement, bowel obstruction, acute coronary syndrome  Comorbidities that complicate the patient evaluation: Patients presentation is complicated by their history of CHF  Social Determinants of Health: Patient  lives in a nursing home   increases the complexity of managing their presentation  Additional history obtained: Additional history obtained from nursing home/care facility nurse reports vomiting started  about 5 hours ago, no new medicines.  He was otherwise at his baseline.  He gets daily wound treatment for his feet Records reviewed previous admission documents  Lab Tests: I Ordered, and personally interpreted labs.  The pertinent results include: Hyperkalemia, acute renal failure  Imaging Studies ordered: I ordered imaging studies including X-ray chest   I independently visualized and interpreted imaging which showed no acute finding I agree with the radiologist interpretation  Cardiac Monitoring: The patient was maintained on a cardiac monitor.  I personally viewed and interpreted the cardiac monitor which showed an underlying rhythm of:  sinus rhythm  Medicines ordered and prescription drug management: I ordered medication including calcium, insulin, dextrose, Lokelma, Lasix for hyperkalemia Reevaluation of the patient after these medicines showed that the patient    stayed the same   Critical Interventions:  Medications for hyperkalemia  Consultations Obtained: I requested consultation with the admitting physician   , and discussed  findings as well as pertinent plan - they recommend: Dr. Mitchell Heir will admit  Reevaluation: After the interventions noted above, I reevaluated the patient and found that they have :stayed the same  Complexity of problems addressed: Patients presentation is most consistent with  acute presentation with potential threat to life or bodily function  Disposition: After consideration of the diagnostic results and the patients response to treatment,  I feel that the patent would benefit from admission   .   Patient to be admitted for acute renal failure with hyperkalemia.  No acute EKG changes.  Patient given medications for hyperkalemia, he remains hemodynamically appropriate this time.  We will ensure he has urine output.  Discussed with [facility] hospitalist for admission         Final Clinical Impression(s) / ED Diagnoses Final diagnoses:   AKI (acute kidney injury) ([place])  Hyperkalemia    Rx / DC Orders ED Discharge  Orders     None         Ripley Fraise, MD 11/17/21 [phone]

## 2021-11-17 NOTE — Assessment & Plan Note (Addendum)
Having recurrent hypoglycemia.  Have greatly decreased his Lantus to 15 units twice daily starting 2/28.  Have held doses on 2/27.

## 2021-11-17 NOTE — Hospital Course (Addendum)
66 year old male with past medical history for depression, hypertension, obstructive sleep apnea and diabetes mellitus presented to the emergency room with acute onset of nausea and vomiting and patient found to have AKI with a creatinine of 4.4 and potassium of 6.9.  Admitted to the hospitalist service and treated with [facility] and IV fluids.    Labs have been slowly improving.  Patient has been having episodes of hypoglycemia with symptoms.

## 2021-11-17 NOTE — ED Notes (Addendum)
Notified nurse of CBG. Gave pt pudding. Fruit, tea with lemon packet and artifical sugar and milk. Then re-checked sugar.

## 2021-11-17 NOTE — H&P (Addendum)
[place]   PATIENT NAME: [name]    MR#:  [id number]  DATE OF BIRTH:  [birth year]  DATE OF ADMISSION:  11/16/2021  PRIMARY CARE PHYSICIAN: [name], MD   Patient is coming from: [name]  REQUESTING/REFERRING PHYSICIAN: Ripley Fraise, MD   CHIEF COMPLAINT:   Chief Complaint  Patient presents with   Emesis    HISTORY OF PRESENT ILLNESS:  [name] is a 66 y.o. African-American male with medical history significant for GERD, depression, hypertension, dyslipidemia, thalassemia minor, type 2 diabetes mellitus and obstructive sleep apnea, presented to the ER with acute onset of intractable nausea and vomiting after having dinner today.  He stated that he was feeling queasy with dinner about an hour before his nausea and vomiting started.  He denies any bilious vomitus or hematemesis.  No melena or bright red bleeding per rectum he denies any diarrhea.  History was mostly of yellowish fluid vomited.  No abdominal pain.  He denied any fever or chills.  No cough or wheezing or dyspnea.  No chest pain or palpitations.  No dysuria, oliguria or hematuria or flank pain.  ED Course: When he came to the ER vital signs were within normal.  Labs revealed mild hyponatremia and hypokalemia with significant hyperkalemia of 6.9.  His BUN was 91 and creatinine 4.84 compared to 72/1.87 on 10/24/2021 and 21/0.97 on 09/09/21.  CBC showed anemia slightly worse than previous levels. EKG as reviewed by me : EKG shows normal sinus rhythm with a rate of 85 with slightly peaked T waves in V2 and V3. Imaging: Portable chest ray showed no acute cardiopulmonary disease.  The patient was given a gram of IV calcium gluconate, 10 g of p.o. Lokelma, 5 mg of IV insulin and 50 mill amp of D50 as well as 40 mg of IV Lasix.  He will be admitted to a medical telemetry bed for further evaluation and management. PAST MEDICAL HISTORY:   Past Medical History:  Diagnosis Date   Anemia     Arthritis    "left hip" (04/26/2015)   Cellulitis and abscess of leg hositalized 04/25/2015   left   Chronic diastolic CHF (congestive heart failure) ([place]) 06/25/2021   Chronic hip pain    Depression    GERD (gastroesophageal reflux disease)    Hypercholesterolemia    Hypertension    Pneumonia 1960's X 1   Primary skin malignancy with unknown cell type 12/10/2016   Sleep apnea    "couldn't wear the mask" (04/26/2015)   Thalassemia minor    Type II diabetes mellitus ([place])     PAST SURGICAL HISTORY:   Past Surgical History:  Procedure Laterality Date   INGUINAL HERNIA REPAIR Right ~ 2004   SHOULDER ARTHROSCOPY W/ ROTATOR CUFF REPAIR Right ~ 2004   TOENAIL EXCISION Right 1980's X 2   "big toe"    SOCIAL HISTORY:   Social History   Tobacco Use   Smoking status: Every Day    Packs/day: 0.12    Years: 45.00    Pack years: 5.40    Types: Cigarettes   Smokeless tobacco: Never  Substance Use Topics   Alcohol use: No    Comment: 04/26/2015 "I may drink 1/2 glass of wine or a couple beers 1-2 times/month; if that"    FAMILY HISTORY:   Family History  Problem Relation Age of Onset   Diabetes Mellitus II Mother    Diabetes Mellitus II Father    Diabetes  Mellitus II Brother     DRUG ALLERGIES:   Allergies  Allergen Reactions   Other Other (See Comments)    Pt is a Jehovah Witness. No blood products.   Sulfa Antibiotics Other (See Comments)    Causes flu-like symptom : sweating, chills, fever, body aches    REVIEW OF SYSTEMS:   ROS As per history of present illness. All pertinent systems were reviewed above. Constitutional, HEENT, cardiovascular, respiratory, GI, GU, musculoskeletal, neuro, psychiatric, endocrine, integumentary and hematologic systems were reviewed and are otherwise negative/unremarkable except for positive findings mentioned above in the HPI.   MEDICATIONS AT HOME:   Prior to Admission medications   Medication Sig Start Date End  Date Taking? Authorizing Provider  acetaminophen (TYLENOL) 325 MG tablet Take 2 tablets (650 mg total) by mouth every 6 (six) hours as needed for mild pain (or Fever >/= 101). 03/23/15   Robbie Lis, MD  Amino Acids-Protein Hydrolys (FEEDING SUPPLEMENT, PRO-STAT 64,) LIQD Take 45 mLs by mouth 2 (two) times daily.    [provider]  ARIPiprazole (ABILIFY) 2 MG tablet Take 2 mg by mouth at bedtime. 04/27/21   [provider]  aspirin EC 81 MG tablet Take 81 mg by mouth every morning.    [provider]  benzocaine (ORAJEL) 10 % mucosal gel Use as directed 1 application in the mouth or throat every 2 (two) hours as needed for mouth pain (soreness).    [provider]  betamethasone dipropionate 0.05 % lotion Apply 1 application topically every 12 (twelve) hours as needed (itching). 06/07/21   [provider]  chlorhexidine (PERIDEX) 0.12 % solution Use as directed 5 mLs in the mouth or throat 2 (two) times daily.    [provider]  Cholecalciferol (VITAMIN D) 50 MCG (2000 UT) tablet Take 2,000 Units by mouth every morning.    [provider]  docusate sodium (COLACE) 100 MG capsule Take 1 capsule (100 mg total) by mouth 2 (two) times daily as needed for mild constipation. 09/10/21   Pokhrel, Corrie Mckusick, MD  Ensure Max Protein (ENSURE MAX PROTEIN) LIQD Take 330 mLs (11 oz total) by mouth daily. 09/10/21   Pokhrel, Corrie Mckusick, MD  feeding supplement, GLUCERNA SHAKE, (GLUCERNA SHAKE) LIQD Take 237 mLs by mouth daily. 09/11/21   Pokhrel, Corrie Mckusick, MD  ferrous sulfate 325 (65 FE) MG tablet Take 325 mg by mouth every morning.    [provider]  furosemide (LASIX) 20 MG tablet Take 60 mg by mouth 2 (two) times daily. 05/16/21   [provider]  gabapentin (NEURONTIN) 300 MG capsule Take 300 mg by mouth 3 (three) times daily. 05/16/21   [provider]  Glucagon HCl (GLUCAGON EMERGENCY) 1 MG/ML SOLR Inject 1 mg into the muscle once  as needed (hypoglycemia).    [provider]  hydrocortisone (ANUSOL-HC) 2.5 % rectal cream Place 1 application rectally daily as needed for hemorrhoids or anal itching.    [provider]  Insulin Glargine (BASAGLAR KWIKPEN) 100 UNIT/ML Inject 30 Units into the skin 2 (two) times daily. 06/11/21   [provider]  insulin regular (NOVOLIN R) 100 units/mL injection Inject 2-10 Units into the skin See admin instructions. Inject 2-10 units subcutaneously four times daily per sliding scale: CBG 140-199 2 units, 200-250 4 units, 251-299 6 units, 300-349 8 units, 350-400 10 units, above 400 call MD for coverage    [provider]  linagliptin (TRADJENTA) 5 MG TABS tablet Take 5 mg by mouth  every morning.    [provider]  lisinopril (PRINIVIL,ZESTRIL) 2.5 MG tablet Take 1 tablet (2.5 mg total) by mouth daily. Patient taking differently: Take 2.5 mg by mouth every morning. 11/13/15   Thurnell Lose, MD  melatonin 3 MG TABS tablet Take 3 mg by mouth at bedtime.    [provider]  metFORMIN (GLUCOPHAGE) 500 MG tablet Take 500 mg by mouth 2 (two) times daily with a meal. 10/15/17   [provider]  Multiple Vitamin (MULTIVITAMIN WITH MINERALS) TABS tablet Take 1 tablet by mouth daily. Patient taking differently: Take 1 tablet by mouth every morning. 03/23/15   Robbie Lis, MD  nitroGLYCERIN (NITROSTAT) 0.4 MG SL tablet Place 0.4 mg under the tongue every 5 (five) minutes as needed for chest pain.    [provider]  Nutritional Supplements (,FEEDING SUPPLEMENT, PROSOURCE PLUS) liquid Take 30 mLs by mouth daily. 09/10/21   Pokhrel, Corrie Mckusick, MD  nystatin (MYCOSTATIN/NYSTOP) powder Apply topically 2 (two) times daily. Apply to folds 09/10/21   Pokhrel, [place]axman, MD  pantoprazole (PROTONIX) 40 MG tablet Take 1 tablet (40 mg total) by mouth daily. 09/11/21   Pokhrel, Corrie Mckusick, MD  polyethylene glycol (MIRALAX / GLYCOLAX) 17 g packet Take 17 g  by mouth daily as needed for moderate constipation. 09/10/21   Pokhrel, Corrie Mckusick, MD  Psyllium (METAMUCIL FIBER SINGLES PO) Take 2 Wafers by mouth 2 (two) times daily.    [provider]  tamsulosin (FLOMAX) 0.4 MG CAPS capsule Take 0.8 mg by mouth at bedtime.    [provider]  tiZANidine (ZANAFLEX) 4 MG tablet Take 4 mg by mouth every 8 (eight) hours as needed for muscle spasms.    [provider]  traZODone (DESYREL) 50 MG tablet Take 50 mg by mouth at bedtime. 06/08/21   [provider]  triamcinolone cream (KENALOG) 0.1 % Apply 1 application topically 2 (two) times daily. For rash 06/18/21   [provider]  Vilazodone HCl (VIIBRYD) 40 MG TABS Take 40 mg by mouth every morning.    [provider]      VITAL SIGNS:  Blood pressure 124/84, pulse 81, temperature 98.6 F (37 C), temperature source Oral, resp. rate 11, SpO2 100 %.  PHYSICAL EXAMINATION:  Physical Exam  GENERAL:  66 y.o.-year-old African-American male patient lying in the bed with no acute distress.  EYES: Pupils equal, round, reactive to light and accommodation. No scleral icterus. Extraocular muscles intact.  HEENT: Head atraumatic, normocephalic. Oropharynx with dry mucous membrane and tongue and nasopharynx clear.  NECK:  Supple, no jugular venous distention. No thyroid enlargement, no tenderness.  LUNGS: Normal breath sounds bilaterally, no wheezing, rales,rhonchi or crepitation. No use of accessory muscles of respiration.  CARDIOVASCULAR: Regular rate and rhythm, S1, S2 normal. No murmurs, rubs, or gallops.  ABDOMEN: Soft, nondistended, nontender. Bowel sounds present. No organomegaly or mass.  EXTREMITIES: No pedal edema, cyanosis, or clubbing.  NEUROLOGIC: Cranial nerves II through XII are intact. Muscle strength 5/5 in all extremities. Sensation intact. Gait not checked.  PSYCHIATRIC: The patient is alert and oriented x 3.  Normal affect and good eye contact. SKIN:  No obvious rash, lesion, or ulcer.   LABORATORY PANEL:   CBC Recent Labs  Lab 11/16/21 2232  WBC 7.2  HGB 9.8*  HCT 33.2*  PLT 264   ------------------------------------------------------------------------------------------------------------------  Chemistries  Recent Labs  Lab 11/16/21 2232  NA 134*  K 6.9*  CL 93*  CO2 25  GLUCOSE 116*  BUN 91*  CREATININE 4.84*  CALCIUM 9.6  AST 16  ALT 13  ALKPHOS 78  BILITOT 0.2*   ------------------------------------------------------------------------------------------------------------------  Cardiac Enzymes No results for input(s): TROPONINI in the last 168 hours. ------------------------------------------------------------------------------------------------------------------  RADIOLOGY:  DG Chest Port 1 View  Result Date: 11/17/2021 CLINICAL DATA:  Vomiting EXAM: PORTABLE CHEST 1 VIEW COMPARISON:  10/24/2021 FINDINGS: Stable elevation the right hemidiaphragm. Mild cardiomegaly. No confluent opacities or effusions. No acute bony abnormality. IMPRESSION: No active disease. Electronically Signed   By: Rolm Baptise M.D.   On: 11/17/2021 00:39      IMPRESSION AND PLAN:  Principal Problem:   AKI (acute kidney injury) ([place])  1.  Severe hyperkalemia with associated acute kidney injury on top of several recent stage IIIa chronic kidney disease though renal functions in December were within normal. - The patient be admitted to a medical telemetry bed. - Hyperkalemia was aggressively managed potassium level will be followed. - We will place the patient on hydration with IV normal saline and follow his BMP. - Deferring renal ultrasound and nephrology consult to when renal functions elevation do not resolve.  2.  Hyponatremia and hypochloremia. - We will hydrate with IV normal saline and follow BMP.  3.  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 - The patient will be placed on supplement coverage with NovoLog. -  We will continue his basal coverage. - We will continue Neurontin - We will hold off metformin and continue Tradjenta.  4.  Obstructive sleep apnea. - We will holding off CPAP given his nausea and vomiting.  5.  BPH. - Continue Flomax.  6.  GERD. - Continue PPI therapy.   DVT prophylaxis: Lovenox.  Advanced Care Planning:  Code Status: full code.  This was discussed with him. Family Communication:  The plan of care was discussed in details with the patient (and family). I answered all questions. The patient agreed to proceed with the above mentioned plan. Further management will depend upon hospital course. Disposition Plan: Back to previous home environment Consults called: none.  All the records are reviewed and case discussed with ED provider.  Status is: Inpatient  At the time of the admission, it appears that the appropriate admission status for this patient is inpatient.  This is judged to be reasonable and necessary in order to provide the required intensity of service to ensure the patient's safety given the presenting symptoms, physical exam findings and initial radiographic and laboratory data in the context of comorbid conditions.  The patient requires inpatient status due to high intensity of service, high risk of further deterioration and high frequency of surveillance required.  I certify that at the time of admission, it is my clinical judgment that the patient will require inpatient hospital care extending more than 2 midnights.                            Dispo: The patient is from: SNF              Anticipated d/c is to: SNF              Patient currently is not medically stable to d/c.              Difficult to place patient: No   Christel Mormon M.D on 11/17/2021 at 1:40 AM  [facility]   From 7 PM-7 AM, contact night-coverage [email or web address]  CC: Primary care physician; [name], MD

## 2021-11-17 NOTE — ED Notes (Signed)
Report given to Seth Bake, RN (47M).

## 2021-11-17 NOTE — Assessment & Plan Note (Signed)
Stable.  Nutrition to see.

## 2021-11-17 NOTE — Progress Notes (Signed)
[facility] Progress Note  Patient: [name]    [id number]:[id number]  DOA: 11/16/2021    Date of Service: the patient was seen and examined on 11/17/2021  Brief hospital course: 66 year old male with past medical history for depression, hypertension, obstructive sleep apnea and diabetes mellitus presented to the emergency room with acute onset of nausea and vomiting and patient found to have AKI with a creatinine of 4.4 and potassium of 6.9.  Admitted to the hospitalist service and treated with [facility] and IV fluids.    Labs 6 hours later noted improvement in potassium to 5.9 with a creatinine of 4.1.  Assessment and Plan: Assessment and Plan: * AKI (acute kidney injury) ([place])- (present on admission) Creatinine slowly starting to trend downward.  Hyperkalemia- (present on admission) Treated with Lokelma.  Still elevated but improving  Chronic diastolic CHF (congestive heart failure) ([place])- (present on admission) Appears dry.  Checking BNP.  Moderate protein-calorie malnutrition ([place])- (present on admission) Stable.  Nutrition to see.  DM2 (diabetes mellitus, type 2) ([place]) Incidentally some hypoglycemia, now improved    There is no height or weight on file to calculate BMI.        Consultants: None   Procedures: None   Antimicrobials: None   Code Status: Full Code    Subjective: Patient fatigued, denies any shortness of breath  Objective: Vital signs were reviewed and unremarkable. Vitals:   11/17/21 1400 11/17/21 1445  BP: 113/83   Pulse: 81 69  Resp:  15  Temp:    SpO2: 98% 99%    Intake/Output Summary (Last 24 hours) at 11/17/2021 1523 Last data filed at 11/17/2021 0331 Gross per 24 hour  Intake 50 ml  Output 650 ml  Net -600 ml   There were no vitals filed for this visit. There is no height or weight on file to calculate BMI.  Exam:  General: Alert and oriented x3, no acute distress HEENT: Normocephalic and atraumatic, mucous membranes are  dry Cardiovascular: Regular rate and rhythm, S1-S2 Respiratory: Clear to auscultation bilaterally Abdomen: Soft, nontender, nondistended, positive bowel sounds Musculoskeletal: No clubbing or cyanosis, trace pitting edema Skin: No skin breaks, tears or lesions Psychiatry: Appropriate, no evidence of psychoses Neurology: No focal deficits  Data Reviewed: Improving potassium and creatinine  Disposition:  Status is: Inpatient Remains inpatient appropriate because: continued need to normalize renal function and potassium    Family Communication: Left message in family DVT Prophylaxis: enoxaparin (LOVENOX) injection 30 mg Start: 11/17/21 1000    Author: Annita Brod ,MD 11/17/2021 3:23 PM  To reach On-call, see care teams to locate the attending and reach out via [email or web address]. Between 7PM-7AM, please contact night-coverage If you still have difficulty reaching the attending provider, please page the [facility] (Director on Call) for [facility] on amion for assistance.

## 2021-11-17 NOTE — ED Notes (Signed)
Breakfast Order Placed ?

## 2021-11-17 NOTE — Assessment & Plan Note (Addendum)
Kidney function continues to improve, creatinine down to 1.8.  Baseline is normal without any kidney issues

## 2021-11-18 DIAGNOSIS — E663 Overweight: Secondary | ICD-10-CM | POA: Diagnosis present

## 2021-11-18 DIAGNOSIS — I5032 Chronic diastolic (congestive) heart failure: Secondary | ICD-10-CM | POA: Diagnosis not present

## 2021-11-18 DIAGNOSIS — N179 Acute kidney failure, unspecified: Secondary | ICD-10-CM | POA: Diagnosis not present

## 2021-11-18 DIAGNOSIS — E875 Hyperkalemia: Secondary | ICD-10-CM | POA: Diagnosis not present

## 2021-11-18 DIAGNOSIS — E440 Moderate protein-calorie malnutrition: Secondary | ICD-10-CM | POA: Diagnosis not present

## 2021-11-18 LAB — GLUCOSE, CAPILLARY
Glucose-Capillary: 95 mg/dL (ref 70–99)
Glucose-Capillary: 76 mg/dL (ref 70–99)
Glucose-Capillary: 84 mg/dL (ref 70–99)
Glucose-Capillary: 100 mg/dL — ABNORMAL HIGH (ref 70–99)

## 2021-11-18 LAB — BASIC METABOLIC PANEL
Anion gap: 8 (ref 5–15)
BUN: 88 mg/dL — ABNORMAL HIGH (ref 8–23)
CO2: 25 mmol/L (ref 22–32)
Calcium: 8.9 mg/dL (ref 8.9–10.3)
Chloride: 104 mmol/L (ref 98–111)
Creatinine, Ser: 2.61 mg/dL — ABNORMAL HIGH (ref 0.61–1.24)
GFR, Estimated: 26 mL/min — ABNORMAL LOW (ref >60)
Glucose, Bld: 70 mg/dL (ref 70–99)
Potassium: 5.7 mmol/L — ABNORMAL HIGH (ref 3.5–5.1)
Sodium: 137 mmol/L (ref 135–145)

## 2021-11-18 MED ORDER — GABAPENTIN 400 MG PO CAPS: 400.0000 mg | ORAL_CAPSULE | Freq: Three times a day (TID) | ORAL | Status: DC

## 2021-11-18 MED ORDER — ENOXAPARIN SODIUM 60 MG/0.6ML IJ SOSY
50.0000 mg | PREFILLED_SYRINGE | INTRAMUSCULAR | Status: DC
  Administered 2021-11-19 – 2021-11-20 (×2): 50 mg via SUBCUTANEOUS
  Filled 2021-11-18 (×2): qty 0.6

## 2021-11-18 MED ORDER — GABAPENTIN 300 MG PO CAPS
300.0000 mg | ORAL_CAPSULE | Freq: Three times a day (TID) | ORAL | Status: DC
  Administered 2021-11-18 – 2021-11-20 (×7): 300 mg via ORAL
  Filled 2021-11-18 (×7): qty 1

## 2021-11-18 MED ORDER — TRAMADOL HCL 50 MG PO TABS
50.0000 mg | ORAL_TABLET | Freq: Four times a day (QID) | ORAL | Status: DC | PRN
  Administered 2021-11-18 – 2021-11-20 (×4): 50 mg via ORAL
  Filled 2021-11-18 (×4): qty 1

## 2021-11-18 NOTE — Assessment & Plan Note (Addendum)
Continue home medications.  Patient complaining of some light, pain shooting down his arm.?Neuropathy.  Slightly increased Neurontin.  As needed muscle relaxer

## 2021-11-18 NOTE — Assessment & Plan Note (Signed)
Meets criteria with BMI greater than 25 

## 2021-11-18 NOTE — Progress Notes (Signed)
[facility] Progress Note  Patient: [name]    [id number]  DOA: 11/16/2021    Date of Service: the patient was seen and examined on 11/18/2021  Brief hospital course: 66 year old male with past medical history for depression, hypertension, obstructive sleep apnea and diabetes mellitus presented to the emergency room with acute onset of nausea and vomiting and patient found to have AKI with a creatinine of 4.4 and potassium of 6.9.  Admitted to the hospitalist service and treated with [facility] and IV fluids.    Labs have been slowly improving.  Assessment and Plan: Assessment and Plan: * AKI (acute kidney injury) ([place])- (present on admission) Kidney function continues to improve, creatinine down to 2.61.  Baseline is normal without any kidney issues  Hyperkalemia- (present on admission) Treated with Lokelma.  Still elevated but improving  Overweight (BMI 25.0-29.9)- (present on admission) Meets criteria with BMI greater than 25  Chronic diastolic CHF (congestive heart failure) ([place])- (present on admission) Appears dry.  BNP within normal limits  Chronic pain- (present on admission) Continue home medications.  Patient complaining of some light, pain shooting down his arm.?Neuropathy.  Slightly increased Neurontin.  As needed muscle relaxer  Moderate protein-calorie malnutrition ([place])- (present on admission) Stable.  Nutrition to see.  DM2 (diabetes mellitus, type 2) ([place]) Incidentally some hypoglycemia, now improved    Body mass index is 29.38 kg/m.        Consultants: None   Procedures: None   Antimicrobials: None   Code Status: Full Code    Subjective: Patient complains of lightning pains, shooting down his arms  Objective: Vital signs were reviewed and unremarkable. Vitals:   11/18/21 0542 11/18/21 0908  BP: (!) 101/48 (!) 112/54  Pulse: 78 80  Resp: 16 18  Temp: 99 F (37.2 C) 98.8 F (37.1 C)  SpO2: 98% 98%    Intake/Output Summary  (Last 24 hours) at 11/18/2021 1528 Last data filed at 11/18/2021 1500 Gross per 24 hour  Intake 1843.65 ml  Output 1350 ml  Net 493.65 ml    Filed Weights   11/17/21 1800  Weight: 112.4 kg   Body mass index is 29.38 kg/m.  Exam:  General: Alert and oriented x3, no acute distress HEENT: Normocephalic and atraumatic, mucous membranes are dry Cardiovascular: Regular rate and rhythm, S1-S2 Respiratory: Clear to auscultation bilaterally Abdomen: Soft, nontender, nondistended, positive bowel sounds Musculoskeletal: No clubbing or cyanosis, trace pitting edema Skin: No skin breaks, tears or lesions Psychiatry: Appropriate, no evidence of psychoses Neurology: No focal deficits  Data Reviewed: Some mild persistent hypokalemia, improving renal function  Disposition:  Status is: Inpatient Remains inpatient appropriate because: continued need to normalize renal function and potassium    Family Communication: Left message for family DVT Prophylaxis:   Lovenox    Author: Annita Brod ,MD 11/18/2021 3:28 PM  To reach On-call, see care teams to locate the attending and reach out via [email or web address]. Between 7PM-7AM, please contact night-coverage If you still have difficulty reaching the attending provider, please page the [facility] (Director on Call) for [facility] on amion for assistance.

## 2021-11-18 NOTE — ED Notes (Signed)
Bladder scanner not working properly, unable to scan bladder before in & out cath. ED provider aware.

## 2021-11-18 NOTE — Progress Notes (Signed)
Patient is continuing to complain of pain in hands, arms, legs, and feet. Administered medication without relief.   MD aware. Will continue to monitor.  Aurther Loft, RN

## 2021-11-19 DIAGNOSIS — I5032 Chronic diastolic (congestive) heart failure: Secondary | ICD-10-CM | POA: Diagnosis not present

## 2021-11-19 DIAGNOSIS — E440 Moderate protein-calorie malnutrition: Secondary | ICD-10-CM | POA: Diagnosis not present

## 2021-11-19 DIAGNOSIS — E875 Hyperkalemia: Secondary | ICD-10-CM | POA: Diagnosis not present

## 2021-11-19 DIAGNOSIS — N179 Acute kidney failure, unspecified: Secondary | ICD-10-CM | POA: Diagnosis not present

## 2021-11-19 LAB — BASIC METABOLIC PANEL
Anion gap: 5 (ref 5–15)
BUN: 69 mg/dL — ABNORMAL HIGH (ref 8–23)
CO2: 29 mmol/L (ref 22–32)
Calcium: 8.9 mg/dL (ref 8.9–10.3)
Chloride: 108 mmol/L (ref 98–111)
Creatinine, Ser: 1.8 mg/dL — ABNORMAL HIGH (ref 0.61–1.24)
GFR, Estimated: 41 mL/min — ABNORMAL LOW (ref >60)
Glucose, Bld: 44 mg/dL — CL (ref 70–99)
Potassium: 5 mmol/L (ref 3.5–5.1)
Sodium: 142 mmol/L (ref 135–145)

## 2021-11-19 LAB — GLUCOSE, CAPILLARY
Glucose-Capillary: 40 mg/dL — CL (ref 70–99)
Glucose-Capillary: 69 mg/dL — ABNORMAL LOW (ref 70–99)
Glucose-Capillary: 81 mg/dL (ref 70–99)
Glucose-Capillary: 105 mg/dL — ABNORMAL HIGH (ref 70–99)
Glucose-Capillary: 119 mg/dL — ABNORMAL HIGH (ref 70–99)
Glucose-Capillary: 153 mg/dL — ABNORMAL HIGH (ref 70–99)

## 2021-11-19 MED ORDER — INSULIN GLARGINE-YFGN 100 UNIT/ML ~~LOC~~ SOLN
15.0000 [IU] | Freq: Two times a day (BID) | SUBCUTANEOUS | Status: DC
  Administered 2021-11-20: 15 [IU] via SUBCUTANEOUS
  Filled 2021-11-19 (×2): qty 0.15

## 2021-11-19 NOTE — Progress Notes (Signed)
[facility] Progress Note  Patient: [name]    [id number]  DOA: 11/16/2021    Date of Service: the patient was seen and examined on 11/19/2021  Brief hospital course: 66 year old male with past medical history for depression, hypertension, obstructive sleep apnea and diabetes mellitus presented to the emergency room with acute onset of nausea and vomiting and patient found to have AKI with a creatinine of 4.4 and potassium of 6.9.  Admitted to the hospitalist service and treated with [facility] and IV fluids.    Labs have been slowly improving.  Patient has been having episodes of hypoglycemia with symptoms.    Assessment and Plan: Assessment and Plan: * AKI (acute kidney injury) ([place])- (present on admission) Kidney function continues to improve, creatinine down to 1.8.  Baseline is normal without any kidney issues  Hyperkalemia- (present on admission) Treated with Lokelma.  At 5.0 on 2/27  Overweight (BMI 25.0-29.9)- (present on admission) Meets criteria with BMI greater than 25  Chronic diastolic CHF (congestive heart failure) ([place])- (present on admission) Appears dry.  BNP within normal limits  Chronic pain- (present on admission) Continue home medications.  Patient complaining of some light, pain shooting down his arm.?Neuropathy.  Slightly increased Neurontin.  As needed muscle relaxer  Moderate protein-calorie malnutrition ([place])- (present on admission) Stable.  Nutrition to see.  DM2 (diabetes mellitus, type 2) ([place]) Having recurrent hypoglycemia.  Have greatly decreased his Lantus to 15 units twice daily starting 2/28.  Have held doses on 2/27.    Body mass index is 29.38 kg/m.        Consultants: None   Procedures: None   Antimicrobials: None   Code Status: Full Code    Subjective: Less arm pain today.  Objective: Vital signs were reviewed and unremarkable. Vitals:   11/19/21 0520 11/19/21 0914  BP: 131/60 (!) 119/48  Pulse: 76 76  Resp: 18  19  Temp: 98.6 F (37 C) 98.3 F (36.8 C)  SpO2: 93% 94%    Intake/Output Summary (Last 24 hours) at 11/19/2021 1729 Last data filed at 11/19/2021 1645 Gross per 24 hour  Intake 3596.5 ml  Output 3100 ml  Net 496.5 ml    Filed Weights   11/17/21 1800  Weight: 112.4 kg   Body mass index is 29.38 kg/m.  Exam:  General: Alert and oriented x3, no acute distress HEENT: Normocephalic and atraumatic, mucous membranes are dry Cardiovascular: Regular rate and rhythm, S1-S2 Respiratory: Clear to auscultation bilaterally Abdomen: Soft, nontender, nondistended, positive bowel sounds Musculoskeletal: No clubbing or cyanosis, trace pitting edema Skin: No skin breaks, tears or lesions Psychiatry: Appropriate, no evidence of psychoses Neurology: No focal deficits  Data Reviewed: Potassium on high end of normal.  Renal function better although still elevated creatinine  Disposition:  Status is: Inpatient Remains inpatient appropriate because: Normalizing renal function and prevention of further hypoglycemia    Family Communication: Left message for family DVT Prophylaxis:   Lovenox    Author: Annita Brod ,MD 11/19/2021 5:29 PM  To reach On-call, see care teams to locate the attending and reach out via [email or web address]. Between 7PM-7AM, please contact night-coverage If you still have difficulty reaching the attending provider, please page the [facility] (Director on Call) for [facility] on amion for assistance.

## 2021-11-19 NOTE — Progress Notes (Signed)
Hypoglycemic Event  CBG: 40  Treatment: 8 oz juice/soda  Symptoms: Hungry  Follow-up CBG: Time:8:30 CBG Result:81  Possible Reasons for Event: Medication regimen: medication given at night   Comments/MD notified:Krishan     Connor Foxworthy K Tacey Dimaggio    Orders Received/Actions taken: Patient receive juice and eating.

## 2021-11-19 NOTE — Progress Notes (Signed)
Inpatient Diabetes Program Recommendations  AACE/ADA: New Consensus Statement on Inpatient Glycemic Control (2015)  Target Ranges:  Prepandial:   less than 140 mg/dL      Peak postprandial:   less than 180 mg/dL (1-2 hours)      Critically ill patients:  140 - 180 mg/dL   Lab Results  Component Value Date   GLUCAP 81 11/19/2021   HGBA1C 8.2 (H) 11/17/2021    Review of Glycemic Control  Latest Reference Range & Units 11/18/21 11:26 11/18/21 16:33 11/18/21 23:02 11/19/21 07:30 11/19/21 07:56 11/19/21 08:29  Glucose-Capillary 70 - 99 mg/dL 76 84 100 (H) 40 (LL) 69 (L) 81   Diabetes history: DM 2 Outpatient Diabetes medications:  Basaglar 30 units bid  Novolin R 2-10 units four  Current orders for Inpatient glycemic control:  Novolog sensitive tid with meals and HS Semglee 30 units bid Tradjenta 5 mg daily  Inpatient Diabetes Program Recommendations:    Low blood sugars - consider holding Semglee today and restart tomorrow at [facility] 15 units bid.  Thanks,  Adah Perl, RN, BC-ADM Inpatient Diabetes Coordinator Pager [phone]  (8a-5p)

## 2021-11-19 NOTE — Consult Note (Signed)
[facility] Nurse Consult Note: Reason for Consult: Consult requested for bilat legs.  Pt is wearing compression dressings which he states are changed 3 times a week.  He has been followed by the outpatient wound care center prior to admission for chronic leg wounds.  Wound type: Left posterior calf with full thickness wound; 10X13X.3cm, beefy red with large amt tan drainage, painful to touch. Right outer ankle with full thickness wound, 90% red, 10% yellow, 5X1X.2cm, mod amt tan drainage Right inner ankle with full thickness wound; 90% red, 10% brown, 6X4X.2cm, mod amt tan drainage. Dressing procedure/placement/frequency: Topical treatment orders provided for bedside nurses to perform as follows to absorb drainage and provide moderate compression: Bedside nurse: change bilat leg dressings Q M/W/F as follows:  Remove previous wraps, clean with NS, then cover wounds with Aquacel [name] # [phone]) then ABD pads and kerlex and ace wraps, beginning just behind toes to below knees in a spiral fashion. Pt should resume follow-up with the outpatient wound care center after discharge.  Please re-consult if further assistance is needed.  Thank-you,  Julien Girt MSN, [place], [place], [place], [place]

## 2021-11-20 ENCOUNTER — Other Ambulatory Visit (HOSPITAL_COMMUNITY): Payer: Self-pay

## 2021-11-20 ENCOUNTER — Encounter (HOSPITAL_BASED_OUTPATIENT_CLINIC_OR_DEPARTMENT_OTHER): Payer: Medicare Other | Admitting: Internal Medicine

## 2021-11-20 DIAGNOSIS — N179 Acute kidney failure, unspecified: Secondary | ICD-10-CM | POA: Diagnosis not present

## 2021-11-20 LAB — GLUCOSE, CAPILLARY
Glucose-Capillary: 79 mg/dL (ref 70–99)
Glucose-Capillary: 113 mg/dL — ABNORMAL HIGH (ref 70–99)
Glucose-Capillary: 167 mg/dL — ABNORMAL HIGH (ref 70–99)

## 2021-11-20 LAB — BASIC METABOLIC PANEL
Anion gap: 7 (ref 5–15)
BUN: 40 mg/dL — ABNORMAL HIGH (ref 8–23)
CO2: 26 mmol/L (ref 22–32)
Calcium: 8.7 mg/dL — ABNORMAL LOW (ref 8.9–10.3)
Chloride: 109 mmol/L (ref 98–111)
Creatinine, Ser: 1.05 mg/dL (ref 0.61–1.24)
GFR, Estimated: >60 mL/min (ref >60)
Glucose, Bld: 130 mg/dL — ABNORMAL HIGH (ref 70–99)
Potassium: 4.9 mmol/L (ref 3.5–5.1)
Sodium: 142 mmol/L (ref 135–145)

## 2021-11-20 LAB — RESP PANEL BY RT-PCR (FLU A&B, COVID) ARPGX2
Influenza A by PCR: NEGATIVE
Influenza B by PCR: NEGATIVE
SARS Coronavirus 2 by RT PCR: NEGATIVE

## 2021-11-20 MED ORDER — TIZANIDINE HCL 4 MG PO TABS: 4.0000 mg | ORAL_TABLET | Freq: Three times a day (TID) | ORAL | 0 refills | Status: AC | PRN

## 2021-11-20 MED ORDER — TIZANIDINE HCL 4 MG PO TABS: 4.0000 mg | ORAL_TABLET | Freq: Three times a day (TID) | ORAL | 0 refills | Status: DC | PRN

## 2021-11-20 MED ORDER — OXYCODONE HCL 10 MG PO TABS: 10.0000 mg | ORAL_TABLET | Freq: Three times a day (TID) | ORAL | 0 refills | Status: AC | PRN

## 2021-11-20 MED ORDER — OXYCODONE HCL 10 MG PO TABS: 10.0000 mg | ORAL_TABLET | Freq: Three times a day (TID) | ORAL | 0 refills | Status: DC | PRN

## 2021-11-20 MED ORDER — INSULIN ASPART 100 UNIT/ML IJ SOLN: 0.0000 [IU] | Freq: Every day | INTRAMUSCULAR | Status: DC

## 2021-11-20 MED ORDER — INSULIN GLARGINE-YFGN 100 UNIT/ML ~~LOC~~ SOLN: 15.0000 [IU] | Freq: Every day | SUBCUTANEOUS | Status: DC

## 2021-11-20 MED ORDER — INSULIN ASPART 100 UNIT/ML IJ SOLN
0.0000 [IU] | Freq: Three times a day (TID) | INTRAMUSCULAR | Status: DC
  Administered 2021-11-20: 2 [IU] via SUBCUTANEOUS

## 2021-11-20 MED ORDER — BASAGLAR KWIKPEN 100 UNIT/ML ~~LOC~~ SOPN
15.0000 [IU] | PEN_INJECTOR | Freq: Every day | SUBCUTANEOUS | 0 refills | Status: AC
  Filled 2021-11-20: qty 6, 40d supply, fill #0

## 2021-11-20 MED ORDER — INSULIN PEN NEEDLE 32G X 4 MM MISC
0 refills | Status: AC
  Filled 2021-11-20: qty 100, 30d supply, fill #0

## 2021-11-20 NOTE — NC FL2 (Addendum)
[place] MEDICAID FL2 LEVEL OF CARE SCREENING TOOL     IDENTIFICATION  Patient Name: [name] Birthdate: 08-Nov-1955 Sex: male Admission Date (Current Location): 11/16/2021  [facility] and [place] Number:  Herbalist and Address:  [facility], [place] [address] [place], [place] [address]      Provider Number: [id number]  Attending Physician Name and Address:  Kayleen Memos, DO  Relative Name and Phone Number:       Current Level of Care: Hospital Recommended Level of Care: [place] Prior Approval Number:    Date Approved/Denied:   PASRR Number: [id number]  Discharge Plan: SNF    Current Diagnoses: Patient Active Problem List   Diagnosis Date Noted   Overweight (BMI 25.0-29.9) 11/18/2021   Sepsis ([place]) 09/04/2021   Severe sepsis ([place]) 09/03/2021   Aortic atherosclerosis ([place]) 09/03/2021   Acute respiratory acidosis ([place]) 57/32/2025   Acute metabolic encephalopathy 42/70/6237   Septic shock ([place]) 09/03/2021   Acute on chronic respiratory failure with hypoxia and hypercapnia ([place]) 06/25/2021   CAP (community acquired pneumonia) 06/25/2021   Chronic diastolic CHF (congestive heart failure) ([place]) 06/25/2021   Benign prostatic hyperplasia with urinary obstruction 08/29/2020   Overactive bladder 08/29/2020   Impotence 08/29/2020   Urge incontinence of urine 08/29/2020   Chronic respiratory failure with hypoxia ([place]) 04/06/2019   Dyslipidemia 04/06/2019   Hypomagnesemia 04/06/2019   MDD (major depressive disorder) 04/06/2019   Psychosis ([place]) 04/05/2019   Dyspnea 02/12/2019   Pain in left wrist 62/83/1517   Diastolic dysfunction 61/60/7371   Primary skin malignancy with unknown cell type 03/18/9484   Acute diastolic heart failure ([place]) 08/25/2016   Hypoxia 08/21/2016   Acute respiratory failure with hypoxia and hypercapnia (HCC) 08/21/2016   Moderate protein-calorie malnutrition ([place]) 08/21/2016   Stasis edema of  both lower extremities 08/21/2016   Chronic pain 08/21/2016   Tobacco use disorder 02/23/2016   Venous stasis syndrome 02/09/2016   Spondylosis without myelopathy or radiculopathy, lumbar region 12/13/2015   AKI (acute kidney injury) ([place]) 11/02/2015   Hyperkalemia 11/02/2015   Hypoglycemia 11/02/2015   Cellulitis    Leg ulcer ([place])    Nonhealing ulcer of left lower extremity ([place])    Pain of left leg    Cellulitis of leg 04/25/2015   Degenerative arthritis of hip 04/25/2015   Cellulitis and abscess of leg    Microcytic anemia 03/27/2015   Hypertension    DM2 (diabetes mellitus, type 2) ([place]) 03/20/2015   MSSA (methicillin susceptible Staphylococcus aureus) infection 03/20/2015   Anemia, iron deficiency 03/20/2015   Cellulitis of left lower extremity    Pyomyositis    Systemic infection ([place]) 03/14/2015    Orientation RESPIRATION BLADDER Height & Weight     Self, Time, Situation, Place  O2 (3LNC) Incontinent, External catheter Weight: 247 lb 12.8 oz (112.4 kg) Height:  6\' 5"  (195.6 cm)  BEHAVIORAL SYMPTOMS/MOOD NEUROLOGICAL BOWEL NUTRITION STATUS      Incontinent Diet (see d/c summary)  AMBULATORY STATUS COMMUNICATION OF NEEDS Skin   Extensive Assist Verbally Chronic Bilateral leg wounds                       Personal Care Assistance Level of Assistance  Bathing, Feeding, Dressing Bathing Assistance: Maximum assistance Feeding assistance: Independent Dressing Assistance: Maximum assistance     Functional Limitations Info  Sight, Hearing, Speech Sight Info: Adequate Hearing Info: Adequate Speech Info: Adequate    SPECIAL CARE FACTORS  FREQUENCY  PT (By licensed PT), OT (By licensed OT)     PT Frequency: 5x/week OT Frequency: 5x/week            Contractures Contractures Info: Not present    Additional Factors Info  Code Status, Allergies Code Status Info: Full code Allergies Info: Pt is a Jehovah Witness. No blood products., sulfa antibiotics            Current Medications (11/20/2021):  This is the current hospital active medication list Current Facility-Administered Medications  Medication Dose Route Frequency Provider Last Rate Last Admin   (feeding supplement) PROSource Plus liquid 60 mL  60 mL Oral BID BM Mansy, Jan A, MD   60 mL at 11/20/21 0952   0.9 %  sodium chloride infusion   Intravenous Continuous Annita Brod, MD 125 mL/hr at 11/20/21 1107 New Bag at 11/20/21 1107   acetaminophen (TYLENOL) tablet 650 mg  650 mg Oral Q6H PRN Mansy, Jan A, MD   650 mg at 11/19/21 1841   Or   acetaminophen (TYLENOL) suppository 650 mg  650 mg Rectal Q6H PRN Mansy, Jan A, MD       ARIPiprazole (ABILIFY) tablet 2 mg  2 mg Oral QHS Mansy, Jan A, MD   2 mg at 11/19/21 2252   benzocaine (ORAJEL) 10 % mucosal gel 1 application  1 application Mouth/Throat Q2H PRN Mansy, Jan A, MD       betamethasone valerate lotion (VALISONE) 0.1 %   Topical BID PRN Annita Brod, MD       chlorhexidine (PERIDEX) 0.12 % solution 5 mL  5 mL Mouth/Throat BID Mansy, Jan A, MD   5 mL at 11/20/21 0454   cholecalciferol (VITAMIN D3) tablet 2,000 Units  2,000 Units Oral q morning Annita Brod, MD   2,000 Units at 11/20/21 1006   docusate sodium (COLACE) capsule 100 mg  100 mg Oral BID PRN Mansy, Jan A, MD       enoxaparin (LOVENOX) injection 50 mg  50 mg Subcutaneous Q24H Annita Brod, MD   50 mg at 11/20/21 0981   ferrous sulfate tablet 325 mg  325 mg Oral q morning Mansy, Jan A, MD   325 mg at 11/20/21 1006   gabapentin (NEURONTIN) capsule 300 mg  300 mg Oral TID Annita Brod, MD   300 mg at 11/20/21 1914   hydrocortisone (ANUSOL-HC) 2.5 % rectal cream 1 application  1 application Rectal Daily PRN Mansy, Jan A, MD       insulin aspart (novoLOG) injection 0-5 Units  0-5 Units Subcutaneous QHS Hall, Carole N, DO       insulin aspart (novoLOG) injection 0-9 Units  0-9 Units Subcutaneous TID WC Hall, Carole N, DO       [START ON 11/21/2021] insulin  glargine-yfgn (SEMGLEE) injection 15 Units  15 Units Subcutaneous Daily Hall, Carole N, DO       linagliptin (TRADJENTA) tablet 5 mg  5 mg Oral q morning Mansy, Jan A, MD   5 mg at 11/20/21 1005   melatonin tablet 3 mg  3 mg Oral QHS Mansy, Jan A, MD   3 mg at 11/19/21 2252   multivitamin with minerals tablet 1 tablet  1 tablet Oral q morning Mansy, Jan A, MD   1 tablet at 11/20/21 1005   nitroGLYCERIN (NITROSTAT) SL tablet 0.4 mg  0.4 mg Sublingual Q5 min PRN Mansy, Arvella Merles, MD       ondansetron [facility]) tablet  4 mg  4 mg Oral Q6H PRN Mansy, Jan A, MD       Or   ondansetron [facility]) injection 4 mg  4 mg Intravenous Q6H PRN Mansy, Jan A, MD       pantoprazole (PROTONIX) EC tablet 40 mg  40 mg Oral Daily Mansy, Jan A, MD   40 mg at 11/20/21 0952   polyethylene glycol (MIRALAX / GLYCOLAX) packet 17 g  17 g Oral Daily PRN Mansy, Jan A, MD       tamsulosin (FLOMAX) capsule 0.8 mg  0.8 mg Oral QHS Mansy, Jan A, MD   0.8 mg at 11/19/21 2249   tiZANidine (ZANAFLEX) tablet 4 mg  4 mg Oral Q8H PRN Mansy, Jan A, MD   4 mg at 11/19/21 0545   traMADol (ULTRAM) tablet 50 mg  50 mg Oral Q6H PRN Annita Brod, MD   50 mg at 11/19/21 0545   traZODone (DESYREL) tablet 50 mg  50 mg Oral QHS Mansy, Jan A, MD   50 mg at 11/19/21 2253   Vilazodone HCl TABS 40 mg  40 mg Oral Daily Annita Brod, MD   40 mg at 11/20/21 0263     Discharge Medications: Please see discharge summary for a list of discharge medications.  Relevant Imaging Results:  Relevant Lab Results:   Additional Information SSN [id number]  Bethann Berkshire, LCSW

## 2021-11-20 NOTE — Progress Notes (Addendum)
Inpatient Diabetes Program Recommendations  AACE/ADA: New Consensus Statement on Inpatient Glycemic Control (2015)  Target Ranges:  Prepandial:   less than 140 mg/dL      Peak postprandial:   less than 180 mg/dL (1-2 hours)      Critically ill patients:  140 - 180 mg/dL   Lab Results  Component Value Date   GLUCAP 79 11/20/2021   HGBA1C 8.2 (H) 11/17/2021    Review of Glycemic Control  Latest Reference Range & Units 11/19/21 07:56 11/19/21 08:29 11/19/21 11:29 11/19/21 17:03 11/19/21 20:53 11/20/21 07:21  Glucose-Capillary 70 - 99 mg/dL 69 (L) 81 105 (H) 119 (H) 153 (H) 79  Diabetes history: DM 2 Outpatient Diabetes medications:  Basaglar 30 units bid  Novolin R 2-10 units four  Current orders for Inpatient glycemic control:  Novolog sensitive tid with meals and HS Semglee 15 units bid Tradjenta 5 mg daily Inpatient Diabetes Program Recommendations:    Consider reducing Semglee further to 15 units daily. Also consider changing HS scale to Bedtime scale for Novolog (0-5 units).    Thanks,  Adah Perl, RN, BC-ADM Inpatient Diabetes Coordinator Pager [phone]  (8a-5p)

## 2021-11-20 NOTE — TOC Transition Note (Signed)
Transition of Care [facility]) - CM/SW Discharge Note   Patient Details  Name: [name] MRN: [id number] Date of Birth: [birth year]  [facility] CM/SW Contact:  Bethann Berkshire, [place] Phone Number: 11/20/2021, 2:27 PM   Clinical Narrative:     CSW called SNF liaison and informed them that pt is [place] today. They requested covid test. MD notified.   Patient will DC to: [facility] Anticipated DC date: 11/20/21 Family notified: pt did not want CSW to notify any family Transport by: Corey Harold   Per MD patient ready for DC to [facility], patient, and facility notified of DC. Discharge Summary and FL2 sent to facility. RN to call report prior to discharge [phone]). DC packet on chart. Ambulance transport requested for patient.   CSW will sign off for now as social work intervention is no longer needed. Please consult [place] again if new needs arise.   Final next level of care: [place] Barriers to Discharge: No Barriers Identified   Patient Goals and CMS Choice        Discharge Placement              Patient chooses bed at: [facility] Patient to be transferred to facility by: [facility]   Patient and family notified of of transfer: 11/20/21  Discharge Plan and Services                                     Social Determinants of Health (SDOH) Interventions     Readmission Risk Interventions No flowsheet data found.

## 2021-11-20 NOTE — Progress Notes (Signed)
DISCHARGE NOTE SNF [facility] to be discharged Skilled nursing facility per MD order. Patient verbalized understanding.  Skin clean, dry and intact without evidence of skin break down, no evidence of skin tears noted. IV catheter discontinued intact. Site without signs and symptoms of complications. Dressing and pressure applied. Pt denies pain at the site currently. No complaints noted.  Patient free of lines, drains, and wounds.   Discharge packet assembled. An After Visit Summary (AVS) was printed and given to the EMS personnel. Patient escorted via stretcher and discharged to [facility] via ambulance. Report called to accepting facility; all questions and concerns addressed.   Arlyss Repress, RN

## 2021-11-20 NOTE — Discharge Summary (Signed)
Discharge Summary  [name] ERX:[id number] DOB: Jan 13, 1956  PCP: [name], MD  Admit date: 11/16/2021 Discharge date: 11/20/2021  Time spent: 35 minutes.  Recommendations for Outpatient Follow-up:  Follow-up with your primary care provider within a week. Take your medications as described Continue PT OT with assistance and fall precautions Continue to go to the wound care clinic, keep your follow-up appointments.  Discharge Diagnoses:  Active Hospital Problems   Diagnosis Date Noted   AKI (acute kidney injury) ([place]) 11/02/2015   Overweight (BMI 25.0-29.9) 11/18/2021   Chronic diastolic CHF (congestive heart failure) ([place]) 06/25/2021   Moderate protein-calorie malnutrition ([place]) 08/21/2016   Chronic pain 08/21/2016   Tobacco use disorder 02/23/2016   Hyperkalemia 11/02/2015   DM2 (diabetes mellitus, type 2) ([place]) 03/20/2015    Resolved Hospital Problems  No resolved problems to display.    Discharge Condition: Stable  Diet recommendation: Resume previous diet.  Vitals:   11/20/21 0626 11/20/21 0916  BP: 133/60 127/62  Pulse: 65 62  Resp: 18 18  Temp: 99 F (37.2 C) 98.6 F (37 C)  SpO2: 99% 96%    History of present illness:  66 year old male with past medical history for depression, hypertension, obstructive sleep apnea and type II diabetes mellitus who presented to the emergency room with acute onset of nausea and vomiting and patient found to have AKI with a creatinine of 4.4 and potassium of 6.9.  Admitted to the hospitalist service and treated with [facility] and IV fluids.    Renal function back to baseline with creatinine 1.05 and GFR greater than 60.  Serum potassium improved, 4.9 on the day of discharge.  Vital signs also reviewed and are stable.  BP 127/62, pulse 63, respiratory 18.   11/20/2021: Patient seen and examined at bedside.  There were no acute events overnight.  He has no new complaints.  Plan to discharge back to SNF.  Hospital Course:   Principal Problem:   AKI (acute kidney injury) ([place]) Active Problems:   DM2 (diabetes mellitus, type 2) ([facility])   Hyperkalemia   Moderate protein-calorie malnutrition ([facility])   Chronic pain   Tobacco use disorder   Chronic diastolic CHF (congestive heart failure) ([facility])   Overweight (BMI 25.0-29.9)  Resolved nonoliguric prerenal AKI (acute kidney injury) ([place])- (present on admission) Kidney function back to baseline.   Resolved post treatment: Hyperkalemia- (present on admission) Treated with Lokelma.  At 4.9 on 11/20/21   Overweight (BMI 25.0-29.9)- (present on admission) Meets criteria with BMI 29 Recommend weight loss outpatient with regular physical activity and healthy dieting.   Chronic diastolic CHF (congestive heart failure) ([place])- (present on admission) Euvolemic on exam.   Chronic pain- (present on admission) Continue home medications.   Slightly increased Neurontin.   As needed muscle relaxer   Moderate protein-calorie malnutrition ([place])- (present on admission) Stable.    DM2 (diabetes mellitus, type 2) ([place]) with hypoglycemia. Continue to episode of hypoglycemia.  Have greatly decreased his Lantus to 15 units daily starting 11/20/21.   Hemoglobin A1c 8.2 on 11/17/2021. Avoid hypoglycemia.  Chronic bilateral lower extremity wounds Follows at the wound care clinic, keep appointments     Consultants: None    Procedures: None    Antimicrobials: None    Code Status: Full Code      Discharge Exam: BP 127/62 (BP Location: Left Arm)    Pulse 62    Temp 98.6 F (37 C)    Resp 18    Ht 6\' 5"  (1.956 m)  Wt 112.4 kg    SpO2 96%    BMI 29.38 kg/m  General: 66 y.o. year-old male well developed well nourished in no acute distress.  Cardiovascular: Regular rate and rhythm with no rubs or gallops.  No thyromegaly or JVD noted.   Respiratory: Clear to auscultation with no wheezes or rales. Good inspiratory effort. Abdomen: Soft nontender nondistended with normal  bowel sounds x4 quadrants. Psychiatry: Mood is appropriate for condition and setting  Discharge Instructions You were cared for by a hospitalist during your hospital stay. If you have any questions about your discharge medications or the care you received while you were in the hospital after you are discharged, you can call the unit and asked to speak with the hospitalist on call if the hospitalist that took care of you is not available. Once you are discharged, your primary care physician will handle any further medical issues. Please note that NO REFILLS for any discharge medications will be authorized once you are discharged, as it is imperative that you return to your primary care physician (or establish a relationship with a primary care physician if you do not have one) for your aftercare needs so that they can reassess your need for medications and monitor your lab values.   Allergies as of 11/20/2021       Reactions   Other Other (See Comments)   Pt is a Jehovah Witness. No blood products.   Sulfa Antibiotics Other (See Comments)   Causes flu-like symptom : sweating, chills, fever, body aches        Medication List     STOP taking these medications    lisinopril 2.5 MG tablet Commonly known as: ZESTRIL   melatonin 3 MG Tabs tablet   metFORMIN 500 MG tablet Commonly known as: GLUCOPHAGE       TAKE these medications    (feeding supplement) PROSource Plus liquid Take 30 mLs by mouth daily.   Ensure Max Protein Liqd Take 330 mLs (11 oz total) by mouth daily.   feeding supplement (GLUCERNA SHAKE) Liqd Take 237 mLs by mouth daily.   acetaminophen 325 MG tablet Commonly known as: TYLENOL Take 2 tablets (650 mg total) by mouth every 6 (six) hours as needed for mild pain (or Fever >/= 101).   ARIPiprazole 2 MG tablet Commonly known as: ABILIFY Take 2 mg by mouth at bedtime.   aspirin EC 81 MG tablet Take 81 mg by mouth every morning.   Basaglar KwikPen 100  UNIT/ML Inject 15 Units into the skin daily. What changed:  how much to take when to take this   benzocaine 10 % mucosal gel Commonly known as: ORAJEL Use as directed 1 application in the mouth or throat every 2 (two) hours as needed for mouth pain (soreness).   betamethasone dipropionate 0.05 % lotion Apply 1 application topically every 12 (twelve) hours as needed (itching).   chlorhexidine 0.12 % solution Commonly known as: PERIDEX Use as directed 5 mLs in the mouth or throat 2 (two) times daily.   docusate sodium 100 MG capsule Commonly known as: COLACE Take 1 capsule (100 mg total) by mouth 2 (two) times daily as needed for mild constipation. What changed: when to take this   feeding supplement (PRO-STAT 64) Liqd Take 30 mLs by mouth 2 (two) times daily.   ferrous sulfate 325 (65 FE) MG tablet Take 325 mg by mouth every morning.   furosemide 20 MG tablet Commonly known as: LASIX Take 60 mg by mouth  2 (two) times daily.   gabapentin 300 MG capsule Commonly known as: NEURONTIN Take 300 mg by mouth 3 (three) times daily.   GENTAMICIN SULFATE EX Apply 1 application topically See admin instructions. Gentamicin ointment - apply topically to right lower leg wound (under silver alginate) every Monday, Wednesday, Friday   Glucagon Emergency 1 MG/ML Solr Inject 1 mg into the muscle once as needed (hypoglycemia).   hydrocortisone 2.5 % rectal cream Commonly known as: ANUSOL-HC Place 1 application rectally daily as needed for hemorrhoids or anal itching.   Insulin Pen Needle 32G X 4 MM Misc Use as directed   insulin regular 100 units/mL injection Commonly known as: NOVOLIN R Inject 2-10 Units into the skin See admin instructions. Inject 2-10 units subcutaneously four times daily per sliding scale: CBG 140-199 2 units, 200-250 4 units, 251-299 6 units, 300-349 8 units, 350-400 10 units, above 400 call MD for coverage   levofloxacin 750 MG tablet Commonly known as:  LEVAQUIN Take 750 mg by mouth every evening.   linagliptin 5 MG Tabs tablet Commonly known as: TRADJENTA Take 5 mg by mouth every morning.   Metamucil Fiber Chew Chew 2 tablets by mouth 2 (two) times daily.   multivitamin with minerals Tabs tablet Take 1 tablet by mouth daily. What changed: when to take this   nitroGLYCERIN 0.4 MG SL tablet Commonly known as: NITROSTAT Place 0.4 mg under the tongue every 5 (five) minutes as needed for chest pain.   nystatin powder Commonly known as: MYCOSTATIN/NYSTOP Apply topically 2 (two) times daily. Apply to folds What changed:  how much to take additional instructions   Oxycodone HCl 10 MG Tabs Take 1 tablet (10 mg total) by mouth every 8 (eight) hours as needed for up to 3 days (moderate to severe pain). What changed: when to take this   pantoprazole 40 MG tablet Commonly known as: PROTONIX Take 1 tablet (40 mg total) by mouth daily. What changed: when to take this   polyethylene glycol 17 g packet Commonly known as: MIRALAX / GLYCOLAX Take 17 g by mouth daily as needed for moderate constipation.   tamsulosin 0.4 MG Caps capsule Commonly known as: FLOMAX Take 0.8 mg by mouth at bedtime.   tiZANidine 4 MG tablet Commonly known as: ZANAFLEX Take 1 tablet (4 mg total) by mouth every 8 (eight) hours as needed for up to 3 days for muscle spasms.   traZODone 100 MG tablet Commonly known as: DESYREL Take 100 mg by mouth at bedtime. What changed: Another medication with the same name was removed. Continue taking this medication, and follow the directions you see here.   triamcinolone cream 0.1 % Commonly known as: KENALOG Apply 1 application topically 2 (two) times daily. For rash   Vilazodone HCl 40 MG Tabs Commonly known as: VIIBRYD Take 40 mg by mouth every morning.   Vitamin D 50 MCG (2000 UT) tablet Take 2,000 Units by mouth every morning.   ZINC OXIDE (TOPICAL) 25 % Oint Apply 1 application topically See admin  instructions. Apply topically to bilateral lower legs every Monday, Wednesday, Friday until healed. Clean with anasept antimicrobial wound cleaner, apply zinc oxide ointment to periwound, apply silver alginate to wound bed; apply ABD pad over primary dressing, wrap and Kerlix and Coban compression       Allergies  Allergen Reactions   Other Other (See Comments)    Pt is a Jehovah Witness. No blood products.   Sulfa Antibiotics Other (See Comments)  Causes flu-like symptom : sweating, chills, fever, body aches    Follow-up Information     [name], MD. Call today.   Specialty: Internal Medicine Why: Please call for a posthospital follow-up appointment. Contact information: [address] [place] [address] [phone]                  The results of significant diagnostics from this hospitalization (including imaging, microbiology, ancillary and laboratory) are listed below for reference.    Significant Diagnostic Studies: DG Chest 1 View  Result Date: 10/24/2021 CLINICAL DATA:  Shortness of breath. EXAM: CHEST  1 VIEW COMPARISON:  09/03/2021.  Chest CT dated 09/04/2021 FINDINGS: The cardiac silhouette remains mildly enlarged. Stable elevation of the right hemidiaphragm. Small amount of residual linear scarring or atelectasis at the right lung base. Otherwise, clear lungs. Normal vascularity taking into account the poor inspiration. No visible pleural fluid. Unremarkable bones. IMPRESSION: No acute abnormality. Electronically Signed   By: Claudie Revering M.D.   On: 10/24/2021 12:34   DG Chest Port 1 View  Result Date: 11/17/2021 CLINICAL DATA:  Vomiting EXAM: PORTABLE CHEST 1 VIEW COMPARISON:  10/24/2021 FINDINGS: Stable elevation the right hemidiaphragm. Mild cardiomegaly. No confluent opacities or effusions. No acute bony abnormality. IMPRESSION: No active disease. Electronically Signed   By: Rolm Baptise M.D.   On: 11/17/2021 00:39     Microbiology: Recent Results (from the past 240 hour(s))  Resp Panel by RT-PCR (Flu A&B, Covid) Nasopharyngeal Swab     Status: None   Collection Time: 11/16/21 10:26 PM   Specimen: Nasopharyngeal Swab; Nasopharyngeal(NP) swabs in vial transport medium  Result Value Ref Range Status   SARS Coronavirus 2 by RT PCR NEGATIVE NEGATIVE Final    Comment: (NOTE) SARS-CoV-2 target nucleic acids are NOT DETECTED.  The SARS-CoV-2 RNA is generally detectable in upper respiratory specimens during the acute phase of infection. The lowest concentration of SARS-CoV-2 viral copies this assay can detect is 138 copies/mL. A negative result does not preclude SARS-Cov-2 infection and should not be used as the sole basis for treatment or other patient management decisions. A negative result may occur with  improper specimen collection/handling, submission of specimen other than nasopharyngeal swab, presence of viral mutation(s) within the areas targeted by this assay, and inadequate number of viral copies(<138 copies/mL). A negative result must be combined with clinical observations, patient history, and epidemiological information. The expected result is Negative.  Fact Sheet for Patients:  EntrepreneurPulse.com.au  Fact Sheet for Healthcare Providers:  IncredibleEmployment.be  This test is no t yet approved or cleared by the [place] FDA and  has been authorized for detection and/or diagnosis of SARS-CoV-2 by FDA under an Emergency Use Authorization (EUA). This EUA will remain  in effect (meaning this test can be used) for the duration of the COVID-19 declaration under Section 564(b)(1) of the Act, 21 U.S.C.section 360bbb-3(b)(1), unless the authorization is terminated  or revoked sooner.       Influenza A by PCR NEGATIVE NEGATIVE Final   Influenza B by PCR NEGATIVE NEGATIVE Final    Comment: (NOTE) The Xpert Xpress SARS-CoV-2/FLU/RSV plus assay is  intended as an aid in the diagnosis of influenza from Nasopharyngeal swab specimens and should not be used as a sole basis for treatment. Nasal washings and aspirates are unacceptable for Xpert Xpress SARS-CoV-2/FLU/RSV testing.  Fact Sheet for Patients: EntrepreneurPulse.com.au  Fact Sheet for Healthcare Providers: IncredibleEmployment.be  This test is not yet approved or cleared by  the [employer] and has been authorized for detection and/or diagnosis of SARS-CoV-2 by FDA under an Emergency Use Authorization (EUA). This EUA will remain in effect (meaning this test can be used) for the duration of the COVID-19 declaration under Section 564(b)(1) of the Act, 21 U.S.C. section 360bbb-3(b)(1), unless the authorization is terminated or revoked.  Performed at [facility], [place] [address] [place], [place] [address]      Labs: Basic Metabolic Panel: Recent Labs  Lab 11/16/21 2232 11/17/21 0430 11/18/21 1049 11/19/21 0628 11/20/21 0432  NA 134* 136 137 142 142  K 6.9* 5.9* 5.7* 5.0 4.9  CL 93* 99 104 108 109  CO2 25 25 25 29 26   GLUCOSE 116* 70 70 44* 130*  BUN 91* 87* 88* 69* 40*  CREATININE 4.84* 4.10* 2.61* 1.80* 1.05  CALCIUM 9.6 9.5 8.9 8.9 8.7*   Liver Function Tests: Recent Labs  Lab 11/16/21 2232  AST 16  ALT 13  ALKPHOS 78  BILITOT 0.2*  PROT 8.3*  ALBUMIN 2.7*   Recent Labs  Lab 11/16/21 2232  LIPASE 22   No results for input(s): AMMONIA in the last 168 hours. CBC: Recent Labs  Lab 11/16/21 2232 11/17/21 0430  WBC 7.2 7.8  NEUTROABS 5.9  --   HGB 9.8* 9.8*  HCT 33.2* 34.0*  MCV 65.2* 65.8*  PLT 264 261   Cardiac Enzymes: No results for input(s): CKTOTAL, CKMB, CKMBINDEX, TROPONINI in the last 168 hours. BNP: BNP (last 3 results) Recent Labs    06/24/21 2043 09/03/21 2158  BNP 40.1 64.2    ProBNP (last 3 results) No results for input(s): PROBNP in the last 8760 hours.  CBG: Recent  Labs  Lab 11/19/21 1129 11/19/21 1703 11/19/21 2053 11/20/21 0721 11/20/21 1109  GLUCAP 105* 119* 153* 79 113*       Signed:  Kayleen Memos, MD [facility] 11/20/2021, 1:52 PM

## 2021-11-21 DIAGNOSIS — L03119 Cellulitis of unspecified part of limb: Secondary | ICD-10-CM | POA: Insufficient documentation

## 2021-11-22 ENCOUNTER — Ambulatory Visit (INDEPENDENT_AMBULATORY_CARE_PROVIDER_SITE_OTHER): Payer: Medicare Other | Admitting: Internal Medicine

## 2021-11-22 ENCOUNTER — Other Ambulatory Visit: Payer: Self-pay

## 2021-11-22 DIAGNOSIS — L03119 Cellulitis of unspecified part of limb: Secondary | ICD-10-CM | POA: Diagnosis present

## 2021-11-22 NOTE — Progress Notes (Signed)
[facility]  Reason for Consult: Chronic venous stasis ulcers of lower extremities Referring Provider: Dr. [name]  Assessment: I do not see any evidence of superinfection of his chronic venous stasis ulcers.  I recommend stopping Levaquin now.  Cultures of his open wounds are not very helpful as they are expected to show colonizing flora.  Plan: Continue compression wraps Recommend discontinuing Levaquin Follow-up here as needed  Patient Active Problem List   Diagnosis Date Noted   Recurrent cellulitis of lower extremity 11/21/2021    Priority: High   Chronic venous stasis dermatitis of both lower extremities 02/09/2016    Priority: High   Nonhealing ulcer of left lower extremity ([place])     Priority: High   Overweight (BMI 25.0-29.9) 11/18/2021   Aortic atherosclerosis ([place]) 09/03/2021   Chronic diastolic CHF (congestive heart failure) ([place]) 06/25/2021   Benign prostatic hyperplasia with urinary obstruction 08/29/2020   Overactive bladder 08/29/2020   Impotence 08/29/2020   Urge incontinence of urine 08/29/2020   Chronic respiratory failure with hypoxia ([place]) 04/06/2019   Dyslipidemia 04/06/2019   MDD (major depressive disorder) 04/06/2019   Psychosis ([place]) 38/06/1750   Diastolic dysfunction 02/58/5277   Primary skin malignancy with unknown cell type 12/10/2016   Moderate protein-calorie malnutrition ([place]) 08/21/2016   Chronic pain 08/21/2016   Tobacco use disorder 02/23/2016   Spondylosis without myelopathy or radiculopathy, lumbar region 12/13/2015   Degenerative arthritis of hip 04/25/2015   Microcytic anemia 03/27/2015   Hypertension    DM2 (diabetes mellitus, type 2) ([place]) 03/20/2015   Anemia, iron deficiency 03/20/2015    Patient's Medications  New Prescriptions   No medications on file  Previous Medications   ACETAMINOPHEN (TYLENOL) 325 MG TABLET    Take 2 tablets (650 mg total) by mouth every 6 (six) hours as  needed for mild pain (or Fever >/= 101).   AMINO ACIDS-PROTEIN HYDROLYS (FEEDING SUPPLEMENT, PRO-STAT 64,) LIQD    Take 30 mLs by mouth 2 (two) times daily.   ARIPIPRAZOLE (ABILIFY) 2 MG TABLET    Take 2 mg by mouth at bedtime.   ASPIRIN EC 81 MG TABLET    Take 81 mg by mouth every morning.   BENZOCAINE (ORAJEL) 10 % MUCOSAL GEL    Use as directed 1 application in the mouth or throat every 2 (two) hours as needed for mouth pain (soreness).   BETAMETHASONE DIPROPIONATE 0.05 % LOTION    Apply 1 application topically every 12 (twelve) hours as needed (itching).   CHLORHEXIDINE (PERIDEX) 0.12 % SOLUTION    Use as directed 5 mLs in the mouth or throat 2 (two) times daily.   CHOLECALCIFEROL (VITAMIN D) 50 MCG (2000 UT) TABLET    Take 2,000 Units by mouth every morning.   DOCUSATE SODIUM (COLACE) 100 MG CAPSULE    Take 1 capsule (100 mg total) by mouth 2 (two) times daily as needed for mild constipation.   ENSURE MAX PROTEIN (ENSURE MAX PROTEIN) LIQD    Take 330 mLs (11 oz total) by mouth daily.   FEEDING SUPPLEMENT, GLUCERNA SHAKE, (GLUCERNA SHAKE) LIQD    Take 237 mLs by mouth daily.   FERROUS SULFATE 325 (65 FE) MG TABLET    Take 325 mg by mouth every morning.   FUROSEMIDE (LASIX) 20 MG TABLET    Take 60 mg by mouth 2 (two) times daily.   GABAPENTIN (NEURONTIN) 300 MG CAPSULE    Take 300 mg by mouth  3 (three) times daily.   GENTAMICIN SULFATE EX    Apply 1 application topically See admin instructions. Gentamicin ointment - apply topically to right lower leg wound (under silver alginate) every Monday, Wednesday, Friday   GLUCAGON HCL (GLUCAGON EMERGENCY) 1 MG/ML SOLR    Inject 1 mg into the muscle once as needed (hypoglycemia).   HYDROCORTISONE (ANUSOL-HC) 2.5 % RECTAL CREAM    Place 1 application rectally daily as needed for hemorrhoids or anal itching.   INSULIN GLARGINE (BASAGLAR KWIKPEN) 100 UNIT/ML    Inject 15 Units into the skin daily.   INSULIN PEN NEEDLE 32G X 4 MM MISC    Use as directed    INSULIN REGULAR (NOVOLIN R) 100 UNITS/ML INJECTION    Inject 2-10 Units into the skin See admin instructions. Inject 2-10 units subcutaneously four times daily per sliding scale: CBG 140-199 2 units, 200-250 4 units, 251-299 6 units, 300-349 8 units, 350-400 10 units, above 400 call MD for coverage   LEVOFLOXACIN (LEVAQUIN) 750 MG TABLET    Take 750 mg by mouth every evening.   LINAGLIPTIN (TRADJENTA) 5 MG TABS TABLET    Take 5 mg by mouth every morning.   METAMUCIL FIBER CHEW    Chew 2 tablets by mouth 2 (two) times daily.   MULTIPLE VITAMIN (MULTIVITAMIN WITH MINERALS) TABS TABLET    Take 1 tablet by mouth daily.   NITROGLYCERIN (NITROSTAT) 0.4 MG SL TABLET    Place 0.4 mg under the tongue every 5 (five) minutes as needed for chest pain.   NUTRITIONAL SUPPLEMENTS (,FEEDING SUPPLEMENT, PROSOURCE PLUS) LIQUID    Take 30 mLs by mouth daily.   NYSTATIN (MYCOSTATIN/NYSTOP) POWDER    Apply topically 2 (two) times daily. Apply to folds   OXYCODONE HCL 10 MG TABS    Take 1 tablet (10 mg total) by mouth every 8 (eight) hours as needed for up to 3 days (moderate to severe pain).   PANTOPRAZOLE (PROTONIX) 40 MG TABLET    Take 1 tablet (40 mg total) by mouth daily.   POLYETHYLENE GLYCOL (MIRALAX / GLYCOLAX) 17 G PACKET    Take 17 g by mouth daily as needed for moderate constipation.   TAMSULOSIN (FLOMAX) 0.4 MG CAPS CAPSULE    Take 0.8 mg by mouth at bedtime.   TIZANIDINE (ZANAFLEX) 4 MG TABLET    Take 1 tablet (4 mg total) by mouth every 8 (eight) hours as needed for up to 3 days for muscle spasms.   TRAZODONE (DESYREL) 100 MG TABLET    Take 100 mg by mouth at bedtime.   TRIAMCINOLONE CREAM (KENALOG) 0.1 %    Apply 1 application topically 2 (two) times daily. For rash   VILAZODONE HCL (VIIBRYD) 40 MG TABS    Take 40 mg by mouth every morning.   ZINC OXIDE, TOPICAL, 25 % OINT    Apply 1 application topically See admin instructions. Apply topically to bilateral lower legs every Monday, Wednesday, Friday until  healed. Clean with anasept antimicrobial wound cleaner, apply zinc oxide ointment to periwound, apply silver alginate to wound bed; apply ABD pad over primary dressing, wrap and Kerlix and Coban compression  Modified Medications   No medications on file  Discontinued Medications   No medications on file    HPI: Shray Hunley is a 66 y.o. male with multiple medical problems including chronic venous stasis ulcers, noted diastolic heart failure, chronic respiratory failure, hypertension, diabetes, and obstructive sleep apnea.  He has been followed off and  on at the wound center for the past 6 years.  He has been hospitalized 4 times in the past 8 months.  He was in the hospital for 1 week in December with left leg cellulitis and sepsis.  He had group G strep bacteremia.  He was rehospitalized last week with acute kidney injury and hyperkalemia.  When he was seen at the wound center by Dr. Heber Melvin on 11/13/2021 she commented that there were no signs of infection.  He has been using topical gentamicin and zinc oxide on his lower extremity wounds with Kerlix and Coban compression dressings applied 3 times weekly.  The referral mentions that a culture showed multiple bacteria but I do not have those results or any documentation that there was concern about recent infection.  He tells me that since he was in the hospital recently with acute kidney injury his compression wraps have not been changed since last week.  The medication list sent from his skilled nursing facility indicates that he was started on oral Levaquin yesterday for wound infection.  I do not have any progress notes from his skilled nursing facility.  Review of Systems: Review of Systems  Constitutional:  Negative for chills, diaphoresis and fever.     Past Medical History:  Diagnosis Date   Anemia    Arthritis    "left hip" (04/26/2015)   Cellulitis and abscess of leg hositalized 04/25/2015   left   Chronic diastolic CHF (congestive heart  failure) ([place]) 06/25/2021   Chronic hip pain    Depression    GERD (gastroesophageal reflux disease)    Hypercholesterolemia    Hypertension    Pneumonia 1960's X 1   Primary skin malignancy with unknown cell type 12/10/2016   Sleep apnea    "couldn't wear the mask" (04/26/2015)   Thalassemia minor    Type II diabetes mellitus ([place])     Social History   Tobacco Use   Smoking status: Every Day    Packs/day: 0.12    Years: 45.00    Pack years: 5.40    Types: Cigarettes   Smokeless tobacco: Never  Vaping Use   Vaping Use: Never used  Substance Use Topics   Alcohol use: No    Comment: 04/26/2015 "I may drink 1/2 glass of wine or a couple beers 1-2 times/month; if that"   Drug use: No    Comment: "stopped all drug use in 2005; S/P SARP program in [place]"    Family History  Problem Relation Age of Onset   Diabetes Mellitus II Mother    Diabetes Mellitus II Father    Diabetes Mellitus II Brother    Allergies  Allergen Reactions   Other Other (See Comments)    Pt is a Jehovah Witness. No blood products.   Sulfa Antibiotics Other (See Comments)    Causes flu-like symptom : sweating, chills, fever, body aches    OBJECTIVE: There were no vitals filed for this visit. There is no height or weight on file to calculate BMI.   Physical Exam Constitutional:      Comments: He is very calm and pleasant.  He does not have much recall about recent events and cannot provide much history.  Skin:    General: Skin is dry.     Comments: He has very dry scaly skin on his lower legs.  He has several chronic ulcers.  There is thin bloody drainage on his dressings.  There is no purulence or odor.  Microbiology: Recent Results (from the past 240 hour(s))  Resp Panel by RT-PCR (Flu A&B, Covid) Nasopharyngeal Swab     Status: None   Collection Time: 11/16/21 10:26 PM   Specimen: Nasopharyngeal Swab; Nasopharyngeal(NP) swabs in vial transport medium  Result Value Ref Range Status    SARS Coronavirus 2 by RT PCR NEGATIVE NEGATIVE Final    Comment: (NOTE) SARS-CoV-2 target nucleic acids are NOT DETECTED.  The SARS-CoV-2 RNA is generally detectable in upper respiratory specimens during the acute phase of infection. The lowest concentration of SARS-CoV-2 viral copies this assay can detect is 138 copies/mL. A negative result does not preclude SARS-Cov-2 infection and should not be used as the sole basis for treatment or other patient management decisions. A negative result may occur with  improper specimen collection/handling, submission of specimen other than nasopharyngeal swab, presence of viral mutation(s) within the areas targeted by this assay, and inadequate number of viral copies(<138 copies/mL). A negative result must be combined with clinical observations, patient history, and epidemiological information. The expected result is Negative.  Fact Sheet for Patients:  EntrepreneurPulse.com.au  Fact Sheet for Healthcare Providers:  IncredibleEmployment.be  This test is no t yet approved or cleared by the [place] FDA and  has been authorized for detection and/or diagnosis of SARS-CoV-2 by FDA under an Emergency Use Authorization (EUA). This EUA will remain  in effect (meaning this test can be used) for the duration of the COVID-19 declaration under Section 564(b)(1) of the Act, 21 U.S.C.section 360bbb-3(b)(1), unless the authorization is terminated  or revoked sooner.       Influenza A by PCR NEGATIVE NEGATIVE Final   Influenza B by PCR NEGATIVE NEGATIVE Final    Comment: (NOTE) The Xpert Xpress SARS-CoV-2/FLU/RSV plus assay is intended as an aid in the diagnosis of influenza from Nasopharyngeal swab specimens and should not be used as a sole basis for treatment. Nasal washings and aspirates are unacceptable for Xpert Xpress SARS-CoV-2/FLU/RSV testing.  Fact Sheet for  Patients: EntrepreneurPulse.com.au  Fact Sheet for Healthcare Providers: IncredibleEmployment.be  This test is not yet approved or cleared by the [place] FDA and has been authorized for detection and/or diagnosis of SARS-CoV-2 by FDA under an Emergency Use Authorization (EUA). This EUA will remain in effect (meaning this test can be used) for the duration of the COVID-19 declaration under Section 564(b)(1) of the Act, 21 U.S.C. section 360bbb-3(b)(1), unless the authorization is terminated or revoked.  Performed at [facility], [place] [address], [place], [place] [address]   Resp Panel by RT-PCR (Flu A&B, Covid) Nasopharyngeal Swab     Status: None   Collection Time: 11/20/21 12:36 PM   Specimen: Nasopharyngeal Swab; Nasopharyngeal(NP) swabs in vial transport medium  Result Value Ref Range Status   SARS Coronavirus 2 by RT PCR NEGATIVE NEGATIVE Final    Comment: (NOTE) SARS-CoV-2 target nucleic acids are NOT DETECTED.  The SARS-CoV-2 RNA is generally detectable in upper respiratory specimens during the acute phase of infection. The lowest concentration of SARS-CoV-2 viral copies this assay can detect is 138 copies/mL. A negative result does not preclude SARS-Cov-2 infection and should not be used as the sole basis for treatment or other patient management decisions. A negative result may occur with  improper specimen collection/handling, submission of specimen other than nasopharyngeal swab, presence of viral mutation(s) within the areas targeted by this assay, and inadequate number of viral copies(<138 copies/mL). A negative result must be combined with clinical observations, patient history, and epidemiological  information. The expected result is Negative.  Fact Sheet for Patients:  EntrepreneurPulse.com.au  Fact Sheet for Healthcare Providers:  IncredibleEmployment.be  This test is no t yet  approved or cleared by the [place] FDA and  has been authorized for detection and/or diagnosis of SARS-CoV-2 by FDA under an Emergency Use Authorization (EUA). This EUA will remain  in effect (meaning this test can be used) for the duration of the COVID-19 declaration under Section 564(b)(1) of the Act, 21 U.S.C.section 360bbb-3(b)(1), unless the authorization is terminated  or revoked sooner.       Influenza A by PCR NEGATIVE NEGATIVE Final   Influenza B by PCR NEGATIVE NEGATIVE Final    Comment: (NOTE) The Xpert Xpress SARS-CoV-2/FLU/RSV plus assay is intended as an aid in the diagnosis of influenza from Nasopharyngeal swab specimens and should not be used as a sole basis for treatment. Nasal washings and aspirates are unacceptable for Xpert Xpress SARS-CoV-2/FLU/RSV testing.  Fact Sheet for Patients: EntrepreneurPulse.com.au  Fact Sheet for Healthcare Providers: IncredibleEmployment.be  This test is not yet approved or cleared by the [place] FDA and has been authorized for detection and/or diagnosis of SARS-CoV-2 by FDA under an Emergency Use Authorization (EUA). This EUA will remain in effect (meaning this test can be used) for the duration of the COVID-19 declaration under Section 564(b)(1) of the Act, 21 U.S.C. section 360bbb-3(b)(1), unless the authorization is terminated or revoked.  Performed at [facility], [place] [address], [place], [place] [address]     Michel Bickers, [place] [phone] pager   [phone] cell 11/22/2021, 10:00 AM

## 2021-11-28 ENCOUNTER — Other Ambulatory Visit (HOSPITAL_COMMUNITY): Payer: Self-pay

## 2021-12-11 ENCOUNTER — Encounter (HOSPITAL_BASED_OUTPATIENT_CLINIC_OR_DEPARTMENT_OTHER): Payer: Medicare Other | Attending: Internal Medicine | Admitting: Internal Medicine

## 2021-12-11 ENCOUNTER — Other Ambulatory Visit: Payer: Self-pay

## 2021-12-11 DIAGNOSIS — E1151 Type 2 diabetes mellitus with diabetic peripheral angiopathy without gangrene: Secondary | ICD-10-CM | POA: Diagnosis present

## 2021-12-11 DIAGNOSIS — L97912 Non-pressure chronic ulcer of unspecified part of right lower leg with fat layer exposed: Secondary | ICD-10-CM | POA: Diagnosis not present

## 2021-12-11 DIAGNOSIS — I890 Lymphedema, not elsewhere classified: Secondary | ICD-10-CM | POA: Insufficient documentation

## 2021-12-11 DIAGNOSIS — I110 Hypertensive heart disease with heart failure: Secondary | ICD-10-CM | POA: Diagnosis not present

## 2021-12-11 DIAGNOSIS — E11622 Type 2 diabetes mellitus with other skin ulcer: Secondary | ICD-10-CM | POA: Insufficient documentation

## 2021-12-11 DIAGNOSIS — I872 Venous insufficiency (chronic) (peripheral): Secondary | ICD-10-CM | POA: Insufficient documentation

## 2021-12-11 DIAGNOSIS — I87333 Chronic venous hypertension (idiopathic) with ulcer and inflammation of bilateral lower extremity: Secondary | ICD-10-CM | POA: Diagnosis not present

## 2021-12-11 DIAGNOSIS — I5032 Chronic diastolic (congestive) heart failure: Secondary | ICD-10-CM | POA: Insufficient documentation

## 2021-12-11 DIAGNOSIS — Z794 Long term (current) use of insulin: Secondary | ICD-10-CM | POA: Diagnosis not present

## 2021-12-11 DIAGNOSIS — L97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ed: Secondary | ICD-10-CM | POA: Insufficient documentation

## 2021-12-11 NOTE — Progress Notes (Signed)
[name] ([id number]) ?Visit Report for 12/11/2021 ?Chief Complaint Document Details ?Patient Name: Date of Service: ?[name], [name] 12/11/2021 9:30 A M ?Medical Record Number: [id number] ?Patient Account Number: [id number] ?Date of Birth/Sex: Treating RN: ?10-12-1955 (66 y.o. [name], [name] ?Primary Care Provider: [name] Other Clinician: ?Referring Provider: ?Treating Provider/Extender: [name] ?Polite, Jori Moll ?Weeks in Treatment: 9 ?Information Obtained from: Patient ?Chief Complaint ?10/08/2021; bilateral lower extremity wounds ?Electronic Signature(s) ?Signed: 12/11/2021 10:58:32 AM By: [name] DO ?Entered By: [name] on 12/11/2021 10:51:32 ?-------------------------------------------------------------------------------- ?HPI Details ?Patient Name: Date of Service: ?[name], [name] 12/11/2021 9:30 A M ?Medical Record Number: [id number] ?Patient Account Number: [id number] ?Date of Birth/Sex: Treating RN: ?1956/06/21 (66 y.o. [name], [name] ?Primary Care Provider: [name] Other Clinician: ?Referring Provider: ?Treating Provider/Extender: [name] ?Polite, Jori Moll ?Weeks in Treatment: 9 ?History of Present Illness ?HPI Description: 07/07/15; this is a patient who is in hospital on 8/2 through 8/4. He had cellulitis and abscess of predominantly I think the left leg. He ?received IV antibiotics. Plain x-ray showed no osteomyelitis. An MRI of the left leg did not show osteomyelitis. Cultures showed no predominant organism. His ?hemoglobin A1c was 9.8. He has a history of venous stasis also peripheral vascular disease. He was discharged to [facility]. He really has ?extensive ulcerations on the left lateral leg including a major wound that communicates both posteriorly and superiorly w. When drainage coming out of 2 ?smaller areas. He has a smaller wound on the posterior medial left leg. He has more predominantly venous insufficiency wounds on  predominantly the right ?medial leg above the ankle the right foot into sections. He has recently been put on Augmentin at the nursing home. Previous ABIs/arterial evaluation showed ?triphasic waves diffusely he does not have a major ischemic issue a left lower extremity venous duplex also exam showed no evidence of a DVT on 6/21 ?07/21/15; the patient arrives with really no major change. Culture I did last time was negative. He has not had a follow-up MRI I ordered. The wounds are ?macerated covered with a thick gelatinous surface slough. There is necrotic subcutaneous tissue ?08/04/15; the patient arrives with a considerable improvement in the majority of his wound area. The area on the left leg now has what looks to be a granulated ?base. Most of the wounds on the left leg required an aggressive surgical debridement to remove nonviable fibrinous eschar and subcutaneous tissue however ?after debridement most of this looks better. Although I had asked for repeat MRI of the left leg when he first came in here with grossly purulent material ?coming out of his wounds, it does not appear that this is been done and nor do I actually feel that strongly about it right now. He has severe surrounding ?venous insufficiency and inflammation. I don't believe he has significant PAD ?08/25/15. In general there is still a considerable wound area here but with still extensive service adherent slough. The patient will not allow mechanical ?debridement due to pain. He did not tolerate Medihoney therefore we are left with Santyl for now. She would appear that he has severe surrounding venous ?stasis. There is no evidence of the infection that may have had something to do with the pathogenesis of these wounds ?09/29/15; patient still has substantial wound area on the left leg with a cluster of several wounds on the lateral leg confluently on the left leg posteriorly and then ?a substantial wound on the medial leg. On the right leg  a  substantial wound medially and a small area on the right lateral foot. All of these underwent a ?substantial surgical debridement with curettes which she tolerated better than he has in the past. This started as a complex cellulitis in the face of chronic ?venous insufficiency and inflammation. ?10/13/15; substantial cluster of wounds on the lateral aspect of his left leg confluently around to the other side. Extensive surgical debridement to remove for ?redness surface slough nonviable subcutaneous tissue. This is not an improvement. Also substantial wound on the medial right leg which is largely unchanged. ?The etiology of this was felt to be a complex cellulitis in the summer of 2016 in the face of chronic venous insufficiency and inflammation. The patient states ?that the wound on the right medial leg has been there for years off and on. ?10/20/15; we are able to start [facility] to these extensive wound areas left greater than right on Tuesday after I spoke to the wound care nurse at the ?facility. He arrives here today for extensive surgical debridement for the wounds on the lateral aspect of his left leg posterior left leg, this is almost ?circumferential. He has a large wound on the medial aspect of his right leg although he finds this too painful for debridement ?10/27/15; I continue to bring this patient back for frequent debridement/weekly debridement severe. We have been using Hydrofera Blue. Unfortunately this has ?not really had any improvement. The debridement surgery difficult and painful for the patient ?11/23/15; this patient spent a complex hospitalization admitted with acute kidney injury, anemia, cellulitis of the lower extremity, he was felt to have sepsis ?pathophysiology although his blood cultures were negative. He had plain x-rays of both legs that were negative for osseous abnormalities. He was seen by ?infectious disease and placed on a workup for vasculitis that was negative including a  biopsy 4 all were consistent with stasis dermatitis. Vital broad- ?spectrum antibiotics he continued to spike fevers. Urine and chest x-ray were negative Dopplers on admission ruled out a DVT All antibiotics were stopped on ?. ?2/17. Since his return to [facility] skilled nursing facility I believe they have been using Xeroform ?12/07/15 the patient arrives today with the area on his right medial leg actually looking quite stable. No debridement. The rest of his extensive wounds on the ?left lateral left posterior extending into the left medial leg all required extensive debridement. I think this is probably going to need to and up in the hands of ?plastic surgery. We'll attempt to change him back to [facility]. Arrange consultation with plastic surgery at [facility] for this almost circumferential wound ?on the left side ?12/21/15 right leg covered in surface slough. This was debridement. Left leg extensive wounds all carefully examined. This is almost circumferential on the left ?side especially on the posterior calf. No debridement is necessary. ?01/04/16 the patient has been to [facility] plastic surgery. Unfortunately it doesn't look like they had any of the prior workup on this patient. They're in ?the middle of vascular workup. It sounds as though they're applying Hydrofera Blue at the nursing home. ?01/22/16; the patient has been back to see [facility]. There apparently making plans to possibly do skin grafts over his large venous insufficiency ulcerations. The ?patient tells me that he goes to hematology in [facility] and variably uses the term platelets red cells and the description of his problem. He is also a ?Jehovah's Witness and will not allow transfusion of blood products. I do not see any  major difference in the wound on the right medial leg and circumferentially ?across the left medial to left lateral leg. Did not attempt to debride these today. ?Readmission: ?05/05/18 on evaluation today patient  presents for reevaluation here in our clinic although I have previously seen him in the skilled nursing facility over the past ?year and a half when I was working in the skilled nursing facility realm instead of coveri

## 2021-12-11 NOTE — Progress Notes (Signed)
Husby, Crescencio ([id number]) ?Visit Report for 12/11/2021 ?Arrival Information Details ?Patient Name: Date of Service: ?[name], [name] 12/11/2021 9:30 A M ?Medical Record Number: [id number] ?Patient Account Number: [id number] ?Date of Birth/Sex: Treating RN: ?November 12, 1955 (66 y.o. [name] ?Primary Care Kelton Bultman: [name] Other Clinician: ?Referring Marelyn Rouser: ?Treating Iva Posten/Extender: [name] ?Polite, Jori Moll ?Weeks in Treatment: 9 ?Visit Information History Since Last Visit ?Added or deleted any medications: No ?Patient Arrived: Wheel Chair ?Any new allergies or adverse reactions: No ?Arrival Time: 09:54 ?Had a fall or experienced change in No ?Accompanied By: Facility Staff ?activities of daily living that may affect ?Transfer Assistance: None ?risk of falls: ?Patient Requires Transmission-Based No ?Signs or symptoms of abuse/neglect since last visito No ?Precautions: ?Hospitalized since last visit: No ?Patient Has Alerts: Yes ?Implantable device outside of the clinic excluding No ?Patient Alerts: Patient on Blood Thinner ?cellular tissue based products placed in the center ?*NO BLOOD ?since last visit: ?PRODUCTS* ?Has Dressing in Place as Prescribed: Yes ?Pain Present Now: Yes ?Electronic Signature(s) ?Signed: 12/11/2021 4:35:21 PM By: Lorrin Jackson ?Entered By: Lorrin Jackson on 12/11/2021 10:00:25 ?-------------------------------------------------------------------------------- ?Clinic Level of Care Assessment Details ?Patient Name: Date of Service: ?[name], [name] 12/11/2021 9:30 A M ?Medical Record Number: [id number] ?Patient Account Number: [id number] ?Date of Birth/Sex: Treating RN: ?1956/05/28 (66 y.o. [name], Lauren ?Primary Care Chance Munter: [name] Other Clinician: ?Referring Taaliyah Delpriore: ?Treating Danella Philson/Extender: [name] ?Polite, Jori Moll ?Weeks in Treatment: 9 ?Clinic Level of Care Assessment Items ?TOOL 4 Quantity Score ?X- 1 0 ?Use when only an EandM is performed on  FOLLOW-UP visit ?ASSESSMENTS - Nursing Assessment / Reassessment ?X- 1 10 ?Reassessment of Co-morbidities (includes updates in patient status) ?X- 1 5 ?Reassessment of Adherence to Treatment Plan ?ASSESSMENTS - Wound and Skin A ssessment / Reassessment ?'[]'$  - 0 ?Simple Wound Assessment / Reassessment - one wound ?X- 3 5 ?Complex Wound Assessment / Reassessment - multiple wounds ?'[]'$  - 0 ?Dermatologic / Skin Assessment (not related to wound area) ?ASSESSMENTS - Focused Assessment ?'[]'$  - 0 ?Circumferential Edema Measurements - multi extremities ?'[]'$  - 0 ?Nutritional Assessment / Counseling / Intervention ?'[]'$  - 0 ?Lower Extremity Assessment (monofilament, tuning fork, pulses) ?'[]'$  - 0 ?Peripheral Arterial Disease Assessment (using hand held doppler) ?ASSESSMENTS - Ostomy and/or Continence Assessment and Care ?'[]'$  - 0 ?Incontinence Assessment and Management ?'[]'$  - 0 ?Ostomy Care Assessment and Management (repouching, etc.) ?PROCESS - Coordination of Care ?'[]'$  - 0 ?Simple Patient / Family Education for ongoing care ?X- 1 20 ?Complex (extensive) Patient / Family Education for ongoing care ?X- 1 10 ?Staff obtains Consents, Records, T Results / Process Orders ?est ?X- 1 10 ?Staff telephones HHA, Nursing Homes / Clarify orders / etc ?'[]'$  - 0 ?Routine Transfer to another Facility (non-emergent condition) ?'[]'$  - 0 ?Routine Hospital Admission (non-emergent condition) ?'[]'$  - 0 ?New Admissions / Biomedical engineer / Ordering NPWT Apligraf, etc. ?, ?'[]'$  - 0 ?Emergency Hospital Admission (emergent condition) ?'[]'$  - 0 ?Simple Discharge Coordination ?X- 1 15 ?Complex (extensive) Discharge Coordination ?PROCESS - Special Needs ?'[]'$  - 0 ?Pediatric / Minor Patient Management ?'[]'$  - 0 ?Isolation Patient Management ?'[]'$  - 0 ?Hearing / Language / Visual special needs ?'[]'$  - 0 ?Assessment of Community assistance (transportation, D/C planning, etc.) ?'[]'$  - 0 ?Additional assistance / Altered mentation ?'[]'$  - 0 ?Support Surface(s) Assessment (bed, cushion,  seat, etc.) ?INTERVENTIONS - Wound Cleansing / Measurement ?'[]'$  - 0 ?Simple Wound Cleansing - one wound ?X- 3 5 ?Complex Wound Cleansing - multiple wounds ?X- 1  5 ?Wound Imaging (photographs - any number of wounds) ?'[]'$  - 0 ?Wound Tracing (instead of photographs) ?'[]'$  - 0 ?Simple Wound Measurement - one wound ?X- 3 5 ?Complex Wound Measurement - multiple wounds ?INTERVENTIONS - Wound Dressings ?'[]'$  - 0 ?Small Wound Dressing one or multiple wounds ?'[]'$  - 0 ?Medium Wound Dressing one or multiple wounds ?X- 3 20 ?Large Wound Dressing one or multiple wounds ?X- 1 5 ?Application of Medications - topical ?'[]'$  - 0 ?Application of Medications - injection ?INTERVENTIONS - Miscellaneous ?'[]'$  - 0 ?External ear exam ?'[]'$  - 0 ?Specimen Collection (cultures, biopsies, blood, body fluids, etc.) ?'[]'$  - 0 ?Specimen(s) / Culture(s) sent or taken to Lab for analysis ?X- 1 10 ?Patient Transfer (multiple staff / Civil Service fast streamer / Similar devices) ?'[]'$  - 0 ?Simple Staple / Suture removal (25 or less) ?'[]'$  - 0 ?Complex Staple / Suture removal (26 or more) ?'[]'$  - 0 ?Hypo / Hyperglycemic Management (close monitor of Blood Glucose) ?'[]'$  - 0 ?Ankle / Brachial Index (ABI) - do not check if billed separately ?X- 1 5 ?Vital Signs ?Has the patient been seen at the hospital within the last three years: Yes ?Total Score: 200 ?Level Of Care: New/Established - Level 5 ?Electronic Signature(s) ?Signed: 12/11/2021 5:22:10 PM By: Rhae Hammock RN ?Entered By: Rhae Hammock on 12/11/2021 10:48:57 ?-------------------------------------------------------------------------------- ?Encounter Discharge Information Details ?Patient Name: Date of Service: ?[name], [name] 12/11/2021 9:30 A M ?Medical Record Number: [id number] ?Patient Account Number: [id number] ?Date of Birth/Sex: Treating RN: ?1956-03-17 (66 y.o. [name], Lauren ?Primary Care Kiley Torrence: [name] Other Clinician: ?Referring Hardeep Reetz: ?Treating Melvern Ramone/Extender: [name] ?Polite,  Jori Moll ?Weeks in Treatment: 9 ?Encounter Discharge Information Items ?Discharge Condition: Stable ?Ambulatory Status: Wheelchair ?Discharge Destination: [place] ?Telephoned: No ?Orders Sent: Yes ?Transportation: Private Auto ?Accompanied By: self ?Schedule Follow-up Appointment: Yes ?Clinical Summary of Care: Patient Declined ?Electronic Signature(s) ?Signed: 12/11/2021 5:22:10 PM By: Rhae Hammock RN ?Entered By: Rhae Hammock on 12/11/2021 10:50:38 ?-------------------------------------------------------------------------------- ?Lower Extremity Assessment Details ?Patient Name: Date of Service: ?[name], [name] 12/11/2021 9:30 A M ?Medical Record Number: [id number] ?Patient Account Number: [id number] ?Date of Birth/Sex: Treating RN: ?02-13-1956 (66 y.o. [name], Lauren ?Primary Care Walsie Smeltz: [name] Other Clinician: ?Referring Kapena Hamme: ?Treating Rudene Poulsen/Extender: [name] ?Polite, Jori Moll ?Weeks in Treatment: 9 ?Edema Assessment ?Assessed: [Left: No] [Right: No] ?Edema: [Left: Yes] [Right: Yes] ?Calf ?Left: Right: ?Point of Measurement: 37 cm From Medial Instep 37.3 cm 33 cm ?Ankle ?Left: Right: ?Point of Measurement: 12 cm From Medial Instep 25 cm 24.5 cm ?Electronic Signature(s) ?Signed: 12/11/2021 5:22:10 PM By: Rhae Hammock RN ?Entered By: Rhae Hammock on 12/11/2021 10:29:50 ?-------------------------------------------------------------------------------- ?Multi Wound Chart Details ?Patient Name: ?Date of Service: ?[name], [name] 12/11/2021 9:30 A M ?Medical Record Number: [id number] ?Patient Account Number: [id number] ?Date of Birth/Sex: ?Treating RN: ?02/08/1956 (66 y.o. [name], Lauren ?Primary Care Veldon Wager: [name] ?Other Clinician: ?Referring Keny Donald: ?Treating Sherrel Ploch/Extender: [name] ?Polite, Jori Moll ?Weeks in Treatment: 9 ?Vital Signs ?Height(in): 77 ?Pulse(bpm): 73 ?Weight(lbs): 258 ?Blood Pressure(mmHg): 138/79 ?Body Mass  Index(BMI): 30.6 ?Temperature(??F): 99.1 ?Respiratory Rate(breaths/min): 18 ?Photos: [26:No Photos] ?Right, Medial Lower Leg Left, Proximal, Posterior Lower Leg Left, Posterior Lower Leg ?Wound Location: ?Gradually Appeared

## 2021-12-25 ENCOUNTER — Encounter (HOSPITAL_BASED_OUTPATIENT_CLINIC_OR_DEPARTMENT_OTHER): Payer: Medicare Other | Attending: Internal Medicine | Admitting: Internal Medicine

## 2021-12-25 DIAGNOSIS — L97319 Non-pressure chronic ulcer of right ankle with unspecified severity: Secondary | ICD-10-CM | POA: Insufficient documentation

## 2021-12-25 DIAGNOSIS — L97912 Non-pressure chronic ulcer of unspecified part of right lower leg with fat layer exposed: Secondary | ICD-10-CM | POA: Diagnosis not present

## 2021-12-25 DIAGNOSIS — L97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ed: Secondary | ICD-10-CM | POA: Diagnosis not present

## 2021-12-25 DIAGNOSIS — I87333 Chronic venous hypertension (idiopathic) with ulcer and inflammation of bilateral lower extremity: Secondary | ICD-10-CM | POA: Insufficient documentation

## 2021-12-25 DIAGNOSIS — L97812 Non-pressure chronic ulcer of other part of right lower leg with fat layer exposed: Secondary | ICD-10-CM | POA: Diagnosis not present

## 2021-12-25 DIAGNOSIS — I890 Lymphedema, not elsewhere classified: Secondary | ICD-10-CM | POA: Diagnosis not present

## 2021-12-25 NOTE — Progress Notes (Signed)
[name], [name] ([id number]) ?Visit Report for 12/25/2021 ?Chief Complaint Document Details ?Patient Name: Date of Service: ?[name], [name] 12/25/2021 9:30 A M ?Medical Record Number: [id number] ?Patient Account Number: [id number] ?Date of Birth/Sex: Treating RN: ?09-23-1956 (66 y.o. [name], Lauren ?Primary Care Provider: [name] Other Clinician: ?Referring Provider: ?Treating Provider/Extender: [name] ?Polite, Jori Moll ?Weeks in Treatment: 11 ?Information Obtained from: Patient ?Chief Complaint ?10/08/2021; bilateral lower extremity wounds ?Electronic Signature(s) ?Signed: 12/25/2021 11:49:02 AM By: [name] DO ?Entered By: [name] on 12/25/2021 10:26:51 ?-------------------------------------------------------------------------------- ?HPI Details ?Patient Name: Date of Service: ?[name], [name] 12/25/2021 9:30 A M ?Medical Record Number: [id number] ?Patient Account Number: [id number] ?Date of Birth/Sex: Treating RN: ?05/15/1956 (66 y.o. [name], Lauren ?Primary Care Provider: [name] Other Clinician: ?Referring Provider: ?Treating Provider/Extender: [name] ?Polite, Jori Moll ?Weeks in Treatment: 11 ?History of Present Illness ?HPI Description: 07/07/15; this is a patient who is in hospital on 8/2 through 8/4. He had cellulitis and abscess of predominantly I think the left leg. He ?received IV antibiotics. Plain x-ray showed no osteomyelitis. An MRI of the left leg did not show osteomyelitis. Cultures showed no predominant organism. His ?hemoglobin A1c was 9.8. He has a history of venous stasis also peripheral vascular disease. He was discharged to [place] home. He really has ?extensive ulcerations on the left lateral leg including a major wound that communicates both posteriorly and superiorly w. When drainage coming out of 2 ?smaller areas. He has a smaller wound on the posterior medial left leg. He has more predominantly venous insufficiency wounds on  predominantly the right ?medial leg above the ankle the right foot into sections. He has recently been put on Augmentin at the nursing home. Previous ABIs/arterial evaluation showed ?triphasic waves diffusely he does not have a major ischemic issue a left lower extremity venous duplex also exam showed no evidence of a DVT on 6/21 ?07/21/15; the patient arrives with really no major change. Culture I did last time was negative. He has not had a follow-up MRI I ordered. The wounds are ?macerated covered with a thick gelatinous surface slough. There is necrotic subcutaneous tissue ?08/04/15; the patient arrives with a considerable improvement in the majority of his wound area. The area on the left leg now has what looks to be a granulated ?base. Most of the wounds on the left leg required an aggressive surgical debridement to remove nonviable fibrinous eschar and subcutaneous tissue however ?after debridement most of this looks better. Although I had asked for repeat MRI of the left leg when he first came in here with grossly purulent material ?coming out of his wounds, it does not appear that this is been done and nor do I actually feel that strongly about it right now. He has severe surrounding ?venous insufficiency and inflammation. I don't believe he has significant PAD ?08/25/15. In general there is still a considerable wound area here but with still extensive service adherent slough. The patient will not allow mechanical ?debridement due to pain. He did not tolerate Medihoney therefore we are left with Santyl for now. She would appear that he has severe surrounding venous ?stasis. There is no evidence of the infection that may have had something to do with the pathogenesis of these wounds ?09/29/15; patient still has substantial wound area on the left leg with a cluster of several wounds on the lateral leg confluently on the left leg posteriorly and then ?a substantial wound on the medial leg. On the right leg  a  substantial wound medially and a small area on the right lateral foot. All of these underwent a ?substantial surgical debridement with curettes which she tolerated better than he has in the past. This started as a complex cellulitis in the face of chronic ?venous insufficiency and inflammation. ?10/13/15; substantial cluster of wounds on the lateral aspect of his left leg confluently around to the other side. Extensive surgical debridement to remove for ?redness surface slough nonviable subcutaneous tissue. This is not an improvement. Also substantial wound on the medial right leg which is largely unchanged. ?The etiology of this was felt to be a complex cellulitis in the summer of 2016 in the face of chronic venous insufficiency and inflammation. The patient states ?that the wound on the right medial leg has been there for years off and on. ?10/20/15; we are able to start [facility] to these extensive wound areas left greater than right on Tuesday after I spoke to the wound care nurse at the ?facility. He arrives here today for extensive surgical debridement for the wounds on the lateral aspect of his left leg posterior left leg, this is almost ?circumferential. He has a large wound on the medial aspect of his right leg although he finds this too painful for debridement ?10/27/15; I continue to bring this patient back for frequent debridement/weekly debridement severe. We have been using Hydrofera Blue. Unfortunately this has ?not really had any improvement. The debridement surgery difficult and painful for the patient ?11/23/15; this patient spent a complex hospitalization admitted with acute kidney injury, anemia, cellulitis of the lower extremity, he was felt to have sepsis ?pathophysiology although his blood cultures were negative. He had plain x-rays of both legs that were negative for osseous abnormalities. He was seen by ?infectious disease and placed on a workup for vasculitis that was negative including a  biopsy 4 all were consistent with stasis dermatitis. Vital broad- ?spectrum antibiotics he continued to spike fevers. Urine and chest x-ray were negative Dopplers on admission ruled out a DVT All antibiotics were stopped on ?. ?2/17. Since his return to [facility] skilled nursing facility I believe they have been using Xeroform ?12/07/15 the patient arrives today with the area on his right medial leg actually looking quite stable. No debridement. The rest of his extensive wounds on the ?left lateral left posterior extending into the left medial leg all required extensive debridement. I think this is probably going to need to and up in the hands of ?plastic surgery. We'll attempt to change him back to [facility]. Arrange consultation with plastic surgery at [facility] for this almost circumferential wound ?on the left side ?12/21/15 right leg covered in surface slough. This was debridement. Left leg extensive wounds all carefully examined. This is almost circumferential on the left ?side especially on the posterior calf. No debridement is necessary. ?01/04/16 the patient has been to [facility] plastic surgery. Unfortunately it doesn't look like they had any of the prior workup on this patient. They're in ?the middle of vascular workup. It sounds as though they're applying Hydrofera Blue at the nursing home. ?01/22/16; the patient has been back to see [facility]. There apparently making plans to possibly do skin grafts over his large venous insufficiency ulcerations. The ?patient tells me that he goes to hematology in [facility] and variably uses the term platelets red cells and the description of his problem. He is also a ?[employer] and will not allow transfusion of blood products. I do not see any  major difference in the wound on the right medial leg and circumferentially ?across the left medial to left lateral leg. Did not attempt to debride these today. ?Readmission: ?05/05/18 on evaluation today patient  presents for reevaluation here in our clinic although I have previously seen him in the skilled nursing facility over the past ?year and a half when I was working in the skilled nursing facility realm instead of covering

## 2021-12-25 NOTE — Progress Notes (Signed)
General, Helen ([id number]) ?Visit Report for 12/25/2021 ?Arrival Information Details ?Patient Name: Date of Service: ?[name], [name] 12/25/2021 9:30 A M ?Medical Record Number: [id number] ?Patient Account Number: [id number] ?Date of Birth/Sex: Treating RN: ?03/09/1956 (66 y.o. [name], Lauren ?Primary Care Jan Walters: [name] Other Clinician: ?Referring Dezmon Conover: ?Treating Zacharia Sowles/Extender: [name] ?Polite, Jori Moll ?Weeks in Treatment: 11 ?Visit Information History Since Last Visit ?Added or deleted any medications: No ?Patient Arrived: Wheel Chair ?Any new allergies or adverse reactions: No ?Arrival Time: 09:35 ?Had a fall or experienced change in No ?Accompanied By: aid ?activities of daily living that may affect ?Transfer Assistance: Civil Service fast streamer ?risk of falls: ?Patient Identification Verified: Yes ?Signs or symptoms of abuse/neglect since last visito No ?Secondary Verification Process Completed: Yes ?Hospitalized since last visit: No ?Patient Requires Transmission-Based No ?Implantable device outside of the clinic excluding No ?Precautions: ?cellular tissue based products placed in the center ?Patient Has Alerts: Yes ?since last visit: ?Patient Alerts: Patient on Blood Thinner ?Has Dressing in Place as Prescribed: Yes ?*NO BLOOD ?Pain Present Now: Yes ?PRODUCTS* ?Electronic Signature(s) ?Signed: 12/25/2021 4:10:53 PM By: Rhae Hammock RN ?Entered By: Rhae Hammock on 12/25/2021 09:35:48 ?-------------------------------------------------------------------------------- ?Clinic Level of Care Assessment Details ?Patient Name: Date of Service: ?[name], [name] 12/25/2021 9:30 A M ?Medical Record Number: [id number] ?Patient Account Number: [id number] ?Date of Birth/Sex: Treating RN: ?18-Sep-1956 (66 y.o. [name], Lauren ?Primary Care Demeka Sutter: [name] Other Clinician: ?Referring Dnya Hickle: ?Treating Arushi Partridge/Extender: [name] ?Polite, Jori Moll ?Weeks in Treatment: 11 ?Clinic Level of Care  Assessment Items ?TOOL 4 Quantity Score ?X- 1 0 ?Use when only an EandM is performed on FOLLOW-UP visit ?ASSESSMENTS - Nursing Assessment / Reassessment ?X- 1 10 ?Reassessment of Co-morbidities (includes updates in patient status) ?X- 1 5 ?Reassessment of Adherence to Treatment Plan ?ASSESSMENTS - Wound and Skin A ssessment / Reassessment ?'[]'$  - 0 ?Simple Wound Assessment / Reassessment - one wound ?X- 3 5 ?Complex Wound Assessment / Reassessment - multiple wounds ?'[]'$  - 0 ?Dermatologic / Skin Assessment (not related to wound area) ?ASSESSMENTS - Focused Assessment ?X- 1 5 ?Circumferential Edema Measurements - multi extremities ?'[]'$  - 0 ?Nutritional Assessment / Counseling / Intervention ?'[]'$  - 0 ?Lower Extremity Assessment (monofilament, tuning fork, pulses) ?'[]'$  - 0 ?Peripheral Arterial Disease Assessment (using hand held doppler) ?ASSESSMENTS - Ostomy and/or Continence Assessment and Care ?'[]'$  - 0 ?Incontinence Assessment and Management ?'[]'$  - 0 ?Ostomy Care Assessment and Management (repouching, etc.) ?PROCESS - Coordination of Care ?'[]'$  - 0 ?Simple Patient / Family Education for ongoing care ?X- 1 20 ?Complex (extensive) Patient / Family Education for ongoing care ?X- 1 10 ?Staff obtains Consents, Records, T Results / Process Orders ?est ?X- 1 10 ?Staff telephones HHA, Nursing Homes / Clarify orders / etc ?'[]'$  - 0 ?Routine Transfer to another Facility (non-emergent condition) ?'[]'$  - 0 ?Routine Hospital Admission (non-emergent condition) ?'[]'$  - 0 ?New Admissions / Biomedical engineer / Ordering NPWT Apligraf, etc. ?, ?'[]'$  - 0 ?Emergency Hospital Admission (emergent condition) ?'[]'$  - 0 ?Simple Discharge Coordination ?X- 1 15 ?Complex (extensive) Discharge Coordination ?PROCESS - Special Needs ?'[]'$  - 0 ?Pediatric / Minor Patient Management ?'[]'$  - 0 ?Isolation Patient Management ?'[]'$  - 0 ?Hearing / Language / Visual special needs ?'[]'$  - 0 ?Assessment of Community assistance (transportation, D/C planning, etc.) ?'[]'$  -  0 ?Additional assistance / Altered mentation ?'[]'$  - 0 ?Support Surface(s) Assessment (bed, cushion, seat, etc.) ?INTERVENTIONS - Wound Cleansing / Measurement ?'[]'$  - 0 ?Simple Wound Cleansing - one wound ?X-  2 5 ?Complex Wound Cleansing - multiple wounds ?X- 1 5 ?Wound Imaging (photographs - any number of wounds) ?'[]'$  - 0 ?Wound Tracing (instead of photographs) ?'[]'$  - 0 ?Simple Wound Measurement - one wound ?X- 2 5 ?Complex Wound Measurement - multiple wounds ?INTERVENTIONS - Wound Dressings ?'[]'$  - 0 ?Small Wound Dressing one or multiple wounds ?'[]'$  - 0 ?Medium Wound Dressing one or multiple wounds ?X- 2 20 ?Large Wound Dressing one or multiple wounds ?'[]'$  - 0 ?Application of Medications - topical ?'[]'$  - 0 ?Application of Medications - injection ?INTERVENTIONS - Miscellaneous ?'[]'$  - 0 ?External ear exam ?'[]'$  - 0 ?Specimen Collection (cultures, biopsies, blood, body fluids, etc.) ?'[]'$  - 0 ?Specimen(s) / Culture(s) sent or taken to Lab for analysis ?X- 1 10 ?Patient Transfer (multiple staff / Civil Service fast streamer / Similar devices) ?'[]'$  - 0 ?Simple Staple / Suture removal (25 or less) ?'[]'$  - 0 ?Complex Staple / Suture removal (26 or more) ?'[]'$  - 0 ?Hypo / Hyperglycemic Management (close monitor of Blood Glucose) ?'[]'$  - 0 ?Ankle / Brachial Index (ABI) - do not check if billed separately ?X- 1 5 ?Vital Signs ?Has the patient been seen at the hospital within the last three years: Yes ?Total Score: 170 ?Level Of Care: New/Established - Level 5 ?Electronic Signature(s) ?Signed: 12/25/2021 4:10:53 PM By: Rhae Hammock RN ?Entered By: Rhae Hammock on 12/25/2021 10:23:30 ?-------------------------------------------------------------------------------- ?Encounter Discharge Information Details ?Patient Name: Date of Service: ?[name], [name] 12/25/2021 9:30 A M ?Medical Record Number: [id number] ?Patient Account Number: [id number] ?Date of Birth/Sex: Treating RN: ?03/03/1956 (66 y.o. [name], Lauren ?Primary Care Paquita Printy: [name] Other  Clinician: ?Referring Leoncio Hansen: ?Treating Autumnrose Yore/Extender: [name] ?Polite, Jori Moll ?Weeks in Treatment: 11 ?Encounter Discharge Information Items ?Discharge Condition: Stable ?Ambulatory Status: Wheelchair ?Discharge Destination: Home ?Transportation: Private Auto ?Accompanied By: self ?Schedule Follow-up Appointment: Yes ?Clinical Summary of Care: Patient Declined ?Electronic Signature(s) ?Signed: 12/25/2021 4:10:53 PM By: Rhae Hammock RN ?Entered By: Rhae Hammock on 12/25/2021 10:26:37 ?-------------------------------------------------------------------------------- ?Lower Extremity Assessment Details ?Patient Name: Date of Service: ?[name], [name] 12/25/2021 9:30 A M ?Medical Record Number: [id number] ?Patient Account Number: [id number] ?Date of Birth/Sex: Treating RN: ?12-31-1955 (66 y.o. [name], Lauren ?Primary Care Gaddiel Cullens: [name] Other Clinician: ?Referring Romari Gasparro: ?Treating Vencent Hauschild/Extender: [name] ?Polite, Jori Moll ?Weeks in Treatment: 11 ?Edema Assessment ?Assessed: [Left: Yes] [Right: Yes] ?Edema: [Left: Yes] [Right: Yes] ?Calf ?Left: Right: ?Point of Measurement: 37 cm From Medial Instep 37.3 cm 33 cm ?Ankle ?Left: Right: ?Point of Measurement: 12 cm From Medial Instep 25 cm 24.5 cm ?Vascular Assessment ?Pulses: ?Dorsalis Pedis ?Palpable: [Left:Yes] [Right:Yes] ?Posterior Tibial ?Palpable: [Left:Yes] [Right:Yes] ?Electronic Signature(s) ?Signed: 12/25/2021 4:10:53 PM By: Rhae Hammock RN ?Entered By: Rhae Hammock on 12/25/2021 09:38:37 ?-------------------------------------------------------------------------------- ?Multi Wound Chart Details ?Patient Name: ?Date of Service: ?[name], [name] 12/25/2021 9:30 A M ?Medical Record Number: [id number] ?Patient Account Number: [id number] ?Date of Birth/Sex: ?Treating RN: ?1956-04-16 (66 y.o. [name], Lauren ?Primary Care Oveda Dadamo: [name] ?Other Clinician: ?Referring Rigby Swamy: ?Treating Osa Fogarty/Extender:  [name] ?Polite, Jori Moll ?Weeks in Treatment: 11 ?Vital Signs ?Height(in): 77 ?Pulse(bpm): 61 ?Weight(lbs): 258 ?Blood Pressure(mmHg): 149/77 ?Body Mass Index(BMI): 30.6 ?Temperature(??F): 98.2 ?Respiratory Rat

## 2022-01-08 ENCOUNTER — Encounter (HOSPITAL_BASED_OUTPATIENT_CLINIC_OR_DEPARTMENT_OTHER): Payer: Medicare Other | Admitting: Internal Medicine

## 2022-01-08 DIAGNOSIS — L97912 Non-pressure chronic ulcer of unspecified part of right lower leg with fat layer exposed: Secondary | ICD-10-CM | POA: Diagnosis not present

## 2022-01-08 DIAGNOSIS — I87333 Chronic venous hypertension (idiopathic) with ulcer and inflammation of bilateral lower extremity: Secondary | ICD-10-CM | POA: Diagnosis not present

## 2022-01-08 DIAGNOSIS — L97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ed: Secondary | ICD-10-CM | POA: Diagnosis not present

## 2022-01-08 DIAGNOSIS — I890 Lymphedema, not elsewhere classified: Secondary | ICD-10-CM

## 2022-01-10 NOTE — Progress Notes (Signed)
[name], [name] ([id number]) ?Visit Report for 01/08/2022 ?Chief Complaint Document Details ?Patient Name: Date of Service: ?[name], [name] 01/08/2022 12:30 PM ?Medical Record Number: [id number] ?Patient Account Number: [id number] ?Date of Birth/Sex: Treating RN: ?[birth year] (66 y.o. [name], [name] ?Primary Care Provider: [name] Other Clinician: ?Referring Provider: ?Treating Provider/Extender: [name] ?Polite, Jori Moll ?Weeks in Treatment: 13 ?Information Obtained from: Patient ?Chief Complaint ?10/08/2021; bilateral lower extremity wounds ?Electronic Signature(s) ?Signed: 01/08/2022 1:57:49 PM By: [name] DO ?Entered By: [name] on 01/08/2022 13:52:46 ?-------------------------------------------------------------------------------- ?HPI Details ?Patient Name: Date of Service: ?[name], [name] 01/08/2022 12:30 PM ?Medical Record Number: [id number] ?Patient Account Number: [id number] ?Date of Birth/Sex: Treating RN: ?12/07/1955 (66 y.o. [name], [name] ?Primary Care Provider: [name] Other Clinician: ?Referring Provider: ?Treating Provider/Extender: [name] ?Polite, Jori Moll ?Weeks in Treatment: 13 ?History of Present Illness ?HPI Description: 07/07/15; this is a patient who is in hospital on 8/2 through 8/4. He had cellulitis and abscess of predominantly I think the left leg. He ?received IV antibiotics. Plain x-ray showed no osteomyelitis. An MRI of the left leg did not show osteomyelitis. Cultures showed no predominant organism. His ?hemoglobin A1c was 9.8. He has a history of venous stasis also peripheral vascular disease. He was discharged to [facility]. He really has ?extensive ulcerations on the left lateral leg including a major wound that communicates both posteriorly and superiorly w. When drainage coming out of 2 ?smaller areas. He has a smaller wound on the posterior medial left leg. He has more predominantly venous insufficiency wounds on  predominantly the right ?medial leg above the ankle the right foot into sections. He has recently been put on Augmentin at the nursing home. Previous ABIs/arterial evaluation showed ?triphasic waves diffusely he does not have a major ischemic issue a left lower extremity venous duplex also exam showed no evidence of a DVT on 6/21 ?07/21/15; the patient arrives with really no major change. Culture I did last time was negative. He has not had a follow-up MRI I ordered. The wounds are ?macerated covered with a thick gelatinous surface slough. There is necrotic subcutaneous tissue ?08/04/15; the patient arrives with a considerable improvement in the majority of his wound area. The area on the left leg now has what looks to be a granulated ?base. Most of the wounds on the left leg required an aggressive surgical debridement to remove nonviable fibrinous eschar and subcutaneous tissue however ?after debridement most of this looks better. Although I had asked for repeat MRI of the left leg when he first came in here with grossly purulent material ?coming out of his wounds, it does not appear that this is been done and nor do I actually feel that strongly about it right now. He has severe surrounding ?venous insufficiency and inflammation. I don't believe he has significant PAD ?08/25/15. In general there is still a considerable wound area here but with still extensive service adherent slough. The patient will not allow mechanical ?debridement due to pain. He did not tolerate Medihoney therefore we are left with Santyl for now. She would appear that he has severe surrounding venous ?stasis. There is no evidence of the infection that may have had something to do with the pathogenesis of these wounds ?09/29/15; patient still has substantial wound area on the left leg with a cluster of several wounds on the lateral leg confluently on the left leg posteriorly and then ?a substantial wound on the medial leg. On the right leg a  substantial wound medially and a small area on the right lateral foot. All of these underwent a ?substantial surgical debridement with curettes which she tolerated better than he has in the past. This started as a complex cellulitis in the face of chronic ?venous insufficiency and inflammation. ?10/13/15; substantial cluster of wounds on the lateral aspect of his left leg confluently around to the other side. Extensive surgical debridement to remove for ?redness surface slough nonviable subcutaneous tissue. This is not an improvement. Also substantial wound on the medial right leg which is largely unchanged. ?The etiology of this was felt to be a complex cellulitis in the summer of 2016 in the face of chronic venous insufficiency and inflammation. The patient states ?that the wound on the right medial leg has been there for years off and on. ?10/20/15; we are able to start [facility] to these extensive wound areas left greater than right on Tuesday after I spoke to the wound care nurse at the ?facility. He arrives here today for extensive surgical debridement for the wounds on the lateral aspect of his left leg posterior left leg, this is almost ?circumferential. He has a large wound on the medial aspect of his right leg although he finds this too painful for debridement ?10/27/15; I continue to bring this patient back for frequent debridement/weekly debridement severe. We have been using Hydrofera Blue. Unfortunately this has ?not really had any improvement. The debridement surgery difficult and painful for the patient ?11/23/15; this patient spent a complex hospitalization admitted with acute kidney injury, anemia, cellulitis of the lower extremity, he was felt to have sepsis ?pathophysiology although his blood cultures were negative. He had plain x-rays of both legs that were negative for osseous abnormalities. He was seen by ?infectious disease and placed on a workup for vasculitis that was negative including a  biopsy 4 all were consistent with stasis dermatitis. Vital broad- ?spectrum antibiotics he continued to spike fevers. Urine and chest x-ray were negative Dopplers on admission ruled out a DVT All antibiotics were stopped on ?. ?2/17. Since his return to [facility] skilled nursing facility I believe they have been using Xeroform ?12/07/15 the patient arrives today with the area on his right medial leg actually looking quite stable. No debridement. The rest of his extensive wounds on the ?left lateral left posterior extending into the left medial leg all required extensive debridement. I think this is probably going to need to and up in the hands of ?plastic surgery. We'll attempt to change him back to [facility]. Arrange consultation with plastic surgery at [facility] for this almost circumferential wound ?on the left side ?12/21/15 right leg covered in surface slough. This was debridement. Left leg extensive wounds all carefully examined. This is almost circumferential on the left ?side especially on the posterior calf. No debridement is necessary. ?01/04/16 the patient has been to [facility] plastic surgery. Unfortunately it doesn't look like they had any of the prior workup on this patient. They're in ?the middle of vascular workup. It sounds as though they're applying Hydrofera Blue at the nursing home. ?01/22/16; the patient has been back to see [facility]. There apparently making plans to possibly do skin grafts over his large venous insufficiency ulcerations. The ?patient tells me that he goes to hematology in [facility] and variably uses the term platelets red cells and the description of his problem. He is also a ?[employer] and will not allow transfusion of blood products. I do not see any major difference  in the wound on the right medial leg and circumferentially ?across the left medial to left lateral leg. Did not attempt to debride these today. ?Readmission: ?05/05/18 on evaluation today patient  presents for reevaluation here in our clinic although I have previously seen him in the skilled nursing facility over the past ?year and a half when I was working in the skilled nursing facility realm instead of cover

## 2022-01-10 NOTE — Progress Notes (Signed)
Meinhart, Kal ([id number]) ?Visit Report for 01/08/2022 ?Arrival Information Details ?Patient Name: Date of Service: ?[name], [name] 01/08/2022 12:30 PM ?Medical Record Number: [id number] ?Patient Account Number: [id number] ?Date of Birth/Sex: Treating RN: ?December 19, 1955 (66 y.o. [name], Lauren ?Primary Care Alanny Rivers: [name] Other Clinician: ?Referring Teng Decou: ?Treating Junetta Hearn/Extender: [name] ?Polite, Jori Moll ?Weeks in Treatment: 13 ?Visit Information History Since Last Visit ?Added or deleted any medications: No ?Patient Arrived: Wheel Chair ?Any new allergies or adverse reactions: No ?Arrival Time: 12:38 ?Had a fall or experienced change in No ?Accompanied By: self ?activities of daily living that may affect ?Transfer Assistance: Civil Service fast streamer ?risk of falls: ?Patient Identification Verified: Yes ?Signs or symptoms of abuse/neglect since last visito No ?Secondary Verification Process Completed: Yes ?Hospitalized since last visit: No ?Patient Requires Transmission-Based No ?Implantable device outside of the clinic excluding No ?Precautions: ?cellular tissue based products placed in the center ?Patient Has Alerts: Yes ?since last visit: ?Patient Alerts: Patient on Blood Thinner ?Has Dressing in Place as Prescribed: Yes ?*NO BLOOD ?Has Compression in Place as Prescribed: Yes ?PRODUCTS* ?Pain Present Now: Yes ?Electronic Signature(s) ?Signed: 01/10/2022 5:27:50 PM By: Rhae Hammock RN ?Entered By: Rhae Hammock on 01/08/2022 12:38:37 ?-------------------------------------------------------------------------------- ?Clinic Level of Care Assessment Details ?Patient Name: Date of Service: ?[name], [name] 01/08/2022 12:30 PM ?Medical Record Number: [id number] ?Patient Account Number: [id number] ?Date of Birth/Sex: Treating RN: ?May 06, 1956 (66 y.o. [name], Lauren ?Primary Care Samad Thon: [name] Other Clinician: ?Referring Erendida Wrenn: ?Treating Zilda No/Extender: [name] ?Polite,  Jori Moll ?Weeks in Treatment: 13 ?Clinic Level of Care Assessment Items ?TOOL 4 Quantity Score ?'[]'$  - 0 ?Use when only an EandM is performed on FOLLOW-UP visit ?ASSESSMENTS - Nursing Assessment / Reassessment ?X- 1 10 ?Reassessment of Co-morbidities (includes updates in patient status) ?X- 1 5 ?Reassessment of Adherence to Treatment Plan ?ASSESSMENTS - Wound and Skin A ssessment / Reassessment ?'[]'$  - 0 ?Simple Wound Assessment / Reassessment - one wound ?X- 2 5 ?Complex Wound Assessment / Reassessment - multiple wounds ?'[]'$  - 0 ?Dermatologic / Skin Assessment (not related to wound area) ?ASSESSMENTS - Focused Assessment ?X- 2 5 ?Circumferential Edema Measurements - multi extremities ?'[]'$  - 0 ?Nutritional Assessment / Counseling / Intervention ?'[]'$  - 0 ?Lower Extremity Assessment (monofilament, tuning fork, pulses) ?'[]'$  - 0 ?Peripheral Arterial Disease Assessment (using hand held doppler) ?ASSESSMENTS - Ostomy and/or Continence Assessment and Care ?X- 1 10 ?Incontinence Assessment and Management ?'[]'$  - 0 ?Ostomy Care Assessment and Management (repouching, etc.) ?PROCESS - Coordination of Care ?'[]'$  - 0 ?Simple Patient / Family Education for ongoing care ?X- 1 20 ?Complex (extensive) Patient / Family Education for ongoing care ?X- 1 10 ?Staff obtains Consents, Records, T Results / Process Orders ?est ?X- 1 10 ?Staff telephones HHA, Nursing Homes / Clarify orders / etc ?'[]'$  - 0 ?Routine Transfer to another Facility (non-emergent condition) ?'[]'$  - 0 ?Routine Hospital Admission (non-emergent condition) ?'[]'$  - 0 ?New Admissions / Biomedical engineer / Ordering NPWT Apligraf, etc. ?, ?'[]'$  - 0 ?Emergency Hospital Admission (emergent condition) ?'[]'$  - 0 ?Simple Discharge Coordination ?X- 1 15 ?Complex (extensive) Discharge Coordination ?PROCESS - Special Needs ?'[]'$  - 0 ?Pediatric / Minor Patient Management ?'[]'$  - 0 ?Isolation Patient Management ?'[]'$  - 0 ?Hearing / Language / Visual special needs ?'[]'$  - 0 ?Assessment of Community assistance  (transportation, D/C planning, etc.) ?'[]'$  - 0 ?Additional assistance / Altered mentation ?'[]'$  - 0 ?Support Surface(s) Assessment (bed, cushion, seat, etc.) ?INTERVENTIONS - Wound Cleansing / Measurement ?'[]'$  - 0 ?Simple Wound  Cleansing - one wound ?X- 2 5 ?Complex Wound Cleansing - multiple wounds ?X- 1 5 ?Wound Imaging (photographs - any number of wounds) ?'[]'$  - 0 ?Wound Tracing (instead of photographs) ?'[]'$  - 0 ?Simple Wound Measurement - one wound ?X- 2 5 ?Complex Wound Measurement - multiple wounds ?INTERVENTIONS - Wound Dressings ?'[]'$  - 0 ?Small Wound Dressing one or multiple wounds ?X- 2 15 ?Medium Wound Dressing one or multiple wounds ?'[]'$  - 0 ?Large Wound Dressing one or multiple wounds ?X- 1 5 ?Application of Medications - topical ?'[]'$  - 0 ?Application of Medications - injection ?INTERVENTIONS - Miscellaneous ?'[]'$  - 0 ?External ear exam ?'[]'$  - 0 ?Specimen Collection (cultures, biopsies, blood, body fluids, etc.) ?'[]'$  - 0 ?Specimen(s) / Culture(s) sent or taken to Lab for analysis ?X- 1 10 ?Patient Transfer (multiple staff / Civil Service fast streamer / Similar devices) ?'[]'$  - 0 ?Simple Staple / Suture removal (25 or less) ?'[]'$  - 0 ?Complex Staple / Suture removal (26 or more) ?'[]'$  - 0 ?Hypo / Hyperglycemic Management (close monitor of Blood Glucose) ?'[]'$  - 0 ?Ankle / Brachial Index (ABI) - do not check if billed separately ?X- 1 5 ?Vital Signs ?Has the patient been seen at the hospital within the last three years: Yes ?Total Score: 175 ?Level Of Care: New/Established - Level 5 ?Electronic Signature(s) ?Signed: 01/10/2022 5:27:50 PM By: Rhae Hammock RN ?Entered By: Rhae Hammock on 01/08/2022 13:53:38 ?-------------------------------------------------------------------------------- ?Encounter Discharge Information Details ?Patient Name: Date of Service: ?[name], [name] 01/08/2022 12:30 PM ?Medical Record Number: [id number] ?Patient Account Number: [id number] ?Date of Birth/Sex: Treating RN: ?1956-01-18 (66 y.o. [name],  Lauren ?Primary Care Darrin Koman: [name] Other Clinician: ?Referring Zhanna Melin: ?Treating Krystel Fletchall/Extender: [name] ?Polite, Jori Moll ?Weeks in Treatment: 13 ?Encounter Discharge Information Items ?Discharge Condition: Stable ?Ambulatory Status: Wheelchair ?Discharge Destination: [place] ?Telephoned: No ?Orders Sent: Yes ?Transportation: Private Auto ?Accompanied By: transport aid ?Schedule Follow-up Appointment: Yes ?Clinical Summary of Care: Patient Declined ?Electronic Signature(s) ?Signed: 01/10/2022 5:27:50 PM By: Rhae Hammock RN ?Entered By: Rhae Hammock on 01/08/2022 13:55:12 ?-------------------------------------------------------------------------------- ?Lower Extremity Assessment Details ?Patient Name: Date of Service: ?[name], [name] 01/08/2022 12:30 PM ?Medical Record Number: [id number] ?Patient Account Number: [id number] ?Date of Birth/Sex: Treating RN: ?[birth year] (66 y.o. [name], Lauren ?Primary Care Placida Cambre: [name] Other Clinician: ?Referring Dekker Verga: ?Treating Roseland Braun/Extender: [name] ?Polite, Jori Moll ?Weeks in Treatment: 13 ?Edema Assessment ?Assessed: [Left: No] [Right: No] ?Edema: [Left: Yes] [Right: Yes] ?Calf ?Left: Right: ?Point of Measurement: 37 cm From Medial Instep 39 cm 37 cm ?Ankle ?Left: Right: ?Point of Measurement: 12 cm From Medial Instep 25 cm 26 cm ?Vascular Assessment ?Pulses: ?Dorsalis Pedis ?Palpable: [Left:Yes] [Right:Yes] ?Posterior Tibial ?Palpable: [Left:Yes] [Right:Yes] ?Electronic Signature(s) ?Signed: 01/10/2022 5:27:50 PM By: Rhae Hammock RN ?Entered By: Rhae Hammock on 01/08/2022 12:43:14 ?-------------------------------------------------------------------------------- ?Multi Wound Chart Details ?Patient Name: ?Date of Service: ?[name], [name] 01/08/2022 12:30 PM ?Medical Record Number: [id number] ?Patient Account Number: [id number] ?Date of Birth/Sex: ?Treating RN: ?January 20, 1956 (66 y.o. [name],  Lauren ?Primary Care Wynelle Dreier: [name] ?Other Clinician: ?Referring Alexiana Laverdure: ?Treating Tysheka Fanguy/Extender: [name] ?Polite, Jori Moll ?Weeks in Treatment: 13 ?Vital Signs ?Height(in): 77 ?Pulse(bpm): 71 ?W

## 2022-01-29 ENCOUNTER — Encounter (HOSPITAL_BASED_OUTPATIENT_CLINIC_OR_DEPARTMENT_OTHER): Payer: Medicare Other | Attending: Internal Medicine | Admitting: Internal Medicine

## 2022-01-29 DIAGNOSIS — I87333 Chronic venous hypertension (idiopathic) with ulcer and inflammation of bilateral lower extremity: Secondary | ICD-10-CM | POA: Insufficient documentation

## 2022-01-29 DIAGNOSIS — I110 Hypertensive heart disease with heart failure: Secondary | ICD-10-CM | POA: Insufficient documentation

## 2022-01-29 DIAGNOSIS — E11622 Type 2 diabetes mellitus with other skin ulcer: Secondary | ICD-10-CM | POA: Insufficient documentation

## 2022-01-29 DIAGNOSIS — L97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ed: Secondary | ICD-10-CM | POA: Insufficient documentation

## 2022-01-29 DIAGNOSIS — E1151 Type 2 diabetes mellitus with diabetic peripheral angiopathy without gangrene: Secondary | ICD-10-CM | POA: Diagnosis not present

## 2022-01-29 DIAGNOSIS — L97812 Non-pressure chronic ulcer of other part of right lower leg with fat layer exposed: Secondary | ICD-10-CM | POA: Insufficient documentation

## 2022-01-29 DIAGNOSIS — I5032 Chronic diastolic (congestive) heart failure: Secondary | ICD-10-CM | POA: Diagnosis not present

## 2022-01-29 DIAGNOSIS — L97912 Non-pressure chronic ulcer of unspecified part of right lower leg with fat layer exposed: Secondary | ICD-10-CM

## 2022-01-29 DIAGNOSIS — I890 Lymphedema, not elsewhere classified: Secondary | ICD-10-CM | POA: Insufficient documentation

## 2022-01-29 NOTE — Progress Notes (Signed)
Steinke, Jalal ([id number]) ?Visit Report for 01/29/2022 ?Chief Complaint Document Details ?Patient Name: Date of Service: ?[name], [name] 01/29/2022 10:45 A M ?Medical Record Number: [id number] ?Patient Account Number: [id number] ?Date of Birth/Sex: Treating RN: ?1955/10/04 (66 y.o. [name], [name] ?Primary Care Provider: [name] Other Clinician: ?Referring Provider: ?Treating Provider/Extender: [name] ?Polite, Jori Moll ?Weeks in Treatment: 16 ?Information Obtained from: Patient ?Chief Complaint ?10/08/2021; bilateral lower extremity wounds ?Electronic Signature(s) ?Signed: 01/29/2022 12:54:45 PM By: [name] DO ?Entered By: [name] on 01/29/2022 12:22:35 ?-------------------------------------------------------------------------------- ?Debridement Details ?Patient Name: Date of Service: ?[name], [name] 01/29/2022 10:45 A M ?Medical Record Number: [id number] ?Patient Account Number: [id number] ?Date of Birth/Sex: Treating RN: ?[birth year] (66 y.o. [name], [name] ?Primary Care Provider: [name] Other Clinician: ?Referring Provider: ?Treating Provider/Extender: [name] ?Polite, Jori Moll ?Weeks in Treatment: 16 ?Debridement Performed for Assessment: Wound #25 Right,Medial Lower Leg ?Performed By: Physician [name], DO ?Debridement Type: Debridement ?Severity of Tissue Pre Debridement: Fat layer exposed ?Level of Consciousness (Pre-procedure): Awake and Alert ?Pre-procedure Verification/Time Out Yes - 11:15 ?Taken: ?Start Time: 11:15 ?Pain Control: Lidocaine ?T Area Debrided (L x W): ?otal 5.3 (cm) x 3 (cm) = 15.9 (cm?) ?Tissue and other material debrided: ?Viable, Non-Viable, Slough, Subcutaneous, Skin: Dermis , Skin: Epidermis, Slough ?Level: Skin/Subcutaneous Tissue ?Debridement Description: Excisional ?Instrument: Curette ?Bleeding: Minimum ?Hemostasis Achieved: Pressure ?End Time: 11:15 ?Procedural Pain: 0 ?Post Procedural Pain: 0 ?Response to Treatment: Procedure was  tolerated well ?Level of Consciousness (Post- Awake and Alert ?procedure): ?Post Debridement Measurements of Total Wound ?Length: (cm) 5.3 ?Width: (cm) 3 ?Depth: (cm) 0.1 ?Volume: (cm?) 1.249 ?Character of Wound/Ulcer Post Debridement: Improved ?Severity of Tissue Post Debridement: Fat layer exposed ?Post Procedure Diagnosis ?Same as Pre-procedure ?Electronic Signature(s) ?Signed: 01/29/2022 12:54:45 PM By: [name] DO ?Signed: 01/29/2022 5:16:05 PM By: Rhae Hammock RN ?Entered By: Rhae Hammock on 01/29/2022 11:26:47 ?-------------------------------------------------------------------------------- ?Debridement Details ?Patient Name: ?Date of Service: ?[name], [name] 01/29/2022 10:45 A M ?Medical Record Number: [id number] ?Patient Account Number: [id number] ?Date of Birth/Sex: ?Treating RN: ?[birth year] (66 y.o. [name], [name] ?Primary Care Provider: [name] ?Other Clinician: ?Referring Provider: ?Treating Provider/Extender: [name] ?Polite, Jori Moll ?Weeks in Treatment: 16 ?Debridement Performed for Assessment: Wound #27 Left,Posterior Lower Leg ?Performed By: Physician [name], DO ?Debridement Type: Debridement ?Severity of Tissue Pre Debridement: Fat layer exposed ?Level of Consciousness (Pre-procedure): Awake and Alert ?Pre-procedure Verification/Time Out Yes - 11:15 ?Taken: ?Start Time: 11:15 ?Pain Control: Lidocaine ?T Area Debrided (L x W): ?otal 16.5 (cm) x 15 (cm) = 247.5 (cm?) ?Tissue and other material debrided: ?Viable, Non-Viable, Slough, Subcutaneous, Skin: Dermis , Skin: Epidermis, Slough ?Level: Skin/Subcutaneous Tissue ?Debridement Description: Excisional ?Instrument: Curette ?Bleeding: Minimum ?Hemostasis Achieved: Pressure ?End Time: 11:15 ?Procedural Pain: 0 ?Post Procedural Pain: 0 ?Response to Treatment: Procedure was tolerated well ?Level of Consciousness (Post- Awake and Alert ?procedure): ?Post Debridement Measurements of Total Wound ?Length: (cm)  16.5 ?Width: (cm) 15 ?Depth: (cm) 0.4 ?Volume: (cm?) 77.754 ?Character of Wound/Ulcer Post Debridement: Improved ?Severity of Tissue Post Debridement: Fat layer exposed ?Post Procedure Diagnosis ?Same as Pre-procedure ?Electronic Signature(s) ?Signed: 01/29/2022 12:54:45 PM By: [name] DO ?Signed: 01/29/2022 5:16:05 PM By: Rhae Hammock RN ?Entered By: Rhae Hammock on 01/29/2022 11:28:56 ?-------------------------------------------------------------------------------- ?HPI Details ?Patient Name: Date of Service: ?[name], [name] 01/29/2022 10:45 A M ?Medical Record Number: [id number] ?Patient Account Number: [id number] ?Date of Birth/Sex: Treating RN: ?Feb 20, 1956 (66 y.o. [name], [name] ?Primary Care Provider: [name] Other Clinician: ?Referring Provider: ?Treating Provider/Extender:  [name] ?Polite, Jori Moll ?Weeks in Treatment: 16 ?History of Present Illness ?HPI Description: 07/07/15; this is a patient who is in hospital on 8/2 through 8/4. He had cellulitis and abscess of predominantly I think the left leg. He ?received IV antibiotics. Plain x-ray showed no osteomyelitis. An MRI of the left leg did not show osteomyelitis. Cultures showed no predominant organism. His ?hemoglobin A1c was 9.8. He has a history of venous stasis also peripheral vascular disease. He was discharged to [facility]. He really has ?extensive ulcerations on the left lateral leg including a major wound that communicates both posteriorly and superiorly w. When drainage coming out of 2 ?smaller areas. He has a smaller wound on the posterior medial left leg. He has more predominantly venous insufficiency wounds on predominantly the right ?medial leg above the ankle the right foot into sections. He has recently been put on Augmentin at the nursing home. Previous ABIs/arterial evaluation showed ?triphasic waves diffusely he does not have a major ischemic issue a left lower extremity venous duplex also  exam showed no evidence of a DVT on 6/21 ?07/21/15; the patient arrives with really no major change. Culture I did last time was negative. He has not had a follow-up MRI I ordered. The wounds are ?macerated covered with a thick gelatinous surface slough. There is necrotic subcutaneous tissue ?08/04/15; the patient arrives with a considerable improvement in the majority of his wound area. The area on the left leg now has what looks to be a granulated ?base. Most of the wounds on the left leg required an aggressive surgical debridement to remove nonviable fibrinous eschar and subcutaneous tissue however ?after debridement most of this looks better. Although I had asked for repeat MRI of the left leg when he first came in here with grossly purulent material ?coming out of his wounds, it does not appear that this is been done and nor do I actually feel that strongly about it right now. He has severe surrounding ?venous insufficiency and inflammation. I don't believe he has significant PAD ?08/25/15. In general there is still a considerable wound area here but with still extensive service adherent slough. The patient will not allow mechanical ?debridement due to pain. He did not tolerate Medihoney therefore we are left with Santyl for now. She would appear that he has severe surrounding venous ?stasis. There is no evidence of the infection that may have had something to do with the pathogenesis of these wounds ?09/29/15; patient still has substantial wound area on the left leg with a cluster of several wounds on the lateral leg confluently on the left leg posteriorly and then ?a substantial wound on the medial leg. On the right leg a substantial wound medially and a small area on the right lateral foot. All of these underwent a ?substantial surgical debridement with curettes which she tolerated better than he has in the past. This started as a complex cellulitis in the face of chronic ?venous insufficiency and  inflammation. ?10/13/15; substantial cluster of wounds on the lateral aspect of his left leg confluently around to the other side. Extensive surgical debridement to remove for ?redness surface slough nonviable subcutaneous t

## 2022-01-29 NOTE — Progress Notes (Addendum)
[place], [place] ([id number]) ?Visit Report for 01/29/2022 ?Arrival Information Details ?Patient Name: Date of Service: ?[name], [name] 01/29/2022 10:45 A M ?Medical Record Number: [id number] ?Patient Account Number: [id number] ?Date of Birth/Sex: Treating RN: ?1956-07-01 (66 y.o. [name], Lauren ?Primary Care Dalissa Lovin: [name] Other Clinician: ?Referring Marcin Holte: ?Treating Jarek Longton/Extender: [name] ?Polite, Jori Moll ?Weeks in Treatment: 16 ?Visit Information History Since Last Visit ?Added or deleted any medications: No ?Patient Arrived: Wheel Chair ?Any new allergies or adverse reactions: No ?Arrival Time: 10:47 ?Had a fall or experienced change in No ?Accompanied By: self ?activities of daily living that may affect ?Transfer Assistance: Manual ?risk of falls: ?Patient Identification Verified: Yes ?Signs or symptoms of abuse/neglect since last visito No ?Secondary Verification Process Completed: Yes ?Hospitalized since last visit: No ?Patient Requires Transmission-Based No ?Implantable device outside of the clinic excluding No ?Precautions: ?cellular tissue based products placed in the center ?Patient Has Alerts: Yes ?since last visit: ?Patient Alerts: Patient on Blood Thinner ?Has Dressing in Place as Prescribed: Yes ?*NO BLOOD ?Pain Present Now: No ?PRODUCTS* ?Electronic Signature(s) ?Signed: 01/29/2022 5:16:05 PM By: Rhae Hammock RN ?Entered By: Rhae Hammock on 01/29/2022 10:49:31 ?-------------------------------------------------------------------------------- ?Clinic Level of Care Assessment Details ?Patient Name: Date of Service: ?[name], [name] 01/29/2022 10:45 A M ?Medical Record Number: [id number] ?Patient Account Number: [id number] ?Date of Birth/Sex: Treating RN: ?12/29/1955 (66 y.o. [name], Lauren ?Primary Care Anihya Tuma: [name] Other Clinician: ?Referring Vashti Bolanos: ?Treating Mael Delap/Extender: [name] ?Polite, Jori Moll ?Weeks in Treatment: 16 ?Clinic Level of Care  Assessment Items ?TOOL 4 Quantity Score ?X- 1 0 ?Use when only an EandM is performed on FOLLOW-UP visit ?ASSESSMENTS - Nursing Assessment / Reassessment ?X- 1 10 ?Reassessment of Co-morbidities (includes updates in patient status) ?X- 1 5 ?Reassessment of Adherence to Treatment Plan ?ASSESSMENTS - Wound and Skin A ssessment / Reassessment ?'[]'$  - 0 ?Simple Wound Assessment / Reassessment - one wound ?X- 2 5 ?Complex Wound Assessment / Reassessment - multiple wounds ?'[]'$  - 0 ?Dermatologic / Skin Assessment (not related to wound area) ?ASSESSMENTS - Focused Assessment ?X- 2 5 ?Circumferential Edema Measurements - multi extremities ?'[]'$  - 0 ?Nutritional Assessment / Counseling / Intervention ?'[]'$  - 0 ?Lower Extremity Assessment (monofilament, tuning fork, pulses) ?'[]'$  - 0 ?Peripheral Arterial Disease Assessment (using hand held doppler) ?ASSESSMENTS - Ostomy and/or Continence Assessment and Care ?'[]'$  - 0 ?Incontinence Assessment and Management ?'[]'$  - 0 ?Ostomy Care Assessment and Management (repouching, etc.) ?PROCESS - Coordination of Care ?'[]'$  - 0 ?Simple Patient / Family Education for ongoing care ?X- 1 20 ?Complex (extensive) Patient / Family Education for ongoing care ?X- 1 10 ?Staff obtains Consents, Records, T Results / Process Orders ?est ?X- 1 10 ?Staff telephones HHA, Nursing Homes / Clarify orders / etc ?'[]'$  - 0 ?Routine Transfer to another Facility (non-emergent condition) ?'[]'$  - 0 ?Routine Hospital Admission (non-emergent condition) ?'[]'$  - 0 ?New Admissions / Biomedical engineer / Ordering NPWT Apligraf, etc. ?, ?'[]'$  - 0 ?Emergency Hospital Admission (emergent condition) ?'[]'$  - 0 ?Simple Discharge Coordination ?X- 1 15 ?Complex (extensive) Discharge Coordination ?PROCESS - Special Needs ?'[]'$  - 0 ?Pediatric / Minor Patient Management ?'[]'$  - 0 ?Isolation Patient Management ?'[]'$  - 0 ?Hearing / Language / Visual special needs ?'[]'$  - 0 ?Assessment of Community assistance (transportation, D/C planning, etc.) ?'[]'$  -  0 ?Additional assistance / Altered mentation ?'[]'$  - 0 ?Support Surface(s) Assessment (bed, cushion, seat, etc.) ?INTERVENTIONS - Wound Cleansing / Measurement ?'[]'$  - 0 ?Simple Wound Cleansing - one wound ?X- 2  5 ?Complex Wound Cleansing - multiple wounds ?X- 1 5 ?Wound Imaging (photographs - any number of wounds) ?'[]'$  - 0 ?Wound Tracing (instead of photographs) ?'[]'$  - 0 ?Simple Wound Measurement - one wound ?X- 2 5 ?Complex Wound Measurement - multiple wounds ?INTERVENTIONS - Wound Dressings ?'[]'$  - 0 ?Small Wound Dressing one or multiple wounds ?'[]'$  - 0 ?Medium Wound Dressing one or multiple wounds ?X- 2 20 ?Large Wound Dressing one or multiple wounds ?X- 1 5 ?Application of Medications - topical ?'[]'$  - 0 ?Application of Medications - injection ?INTERVENTIONS - Miscellaneous ?'[]'$  - 0 ?External ear exam ?'[]'$  - 0 ?Specimen Collection (cultures, biopsies, blood, body fluids, etc.) ?'[]'$  - 0 ?Specimen(s) / Culture(s) sent or taken to Lab for analysis ?X- 1 10 ?Patient Transfer (multiple staff / Civil Service fast streamer / Similar devices) ?'[]'$  - 0 ?Simple Staple / Suture removal (25 or less) ?'[]'$  - 0 ?Complex Staple / Suture removal (26 or more) ?'[]'$  - 0 ?Hypo / Hyperglycemic Management (close monitor of Blood Glucose) ?'[]'$  - 0 ?Ankle / Brachial Index (ABI) - do not check if billed separately ?X- 1 5 ?Vital Signs ?Has the patient been seen at the hospital within the last three years: Yes ?Total Score: 175 ?Level Of Care: New/Established - Level 5 ?Electronic Signature(s) ?Signed: 01/29/2022 5:16:05 PM By: Rhae Hammock RN ?Entered By: Rhae Hammock on 01/29/2022 11:10:39 ?-------------------------------------------------------------------------------- ?Complex / Palliative Patient Assessment Details ?Patient Name: Date of Service: ?[name], [name] 01/29/2022 10:45 A M ?Medical Record Number: [id number] ?Patient Account Number: [id number] ?Date of Birth/Sex: Treating RN: ?12/06/1955 (66 y.o. M) Rolin Barry, Bobbi ?Primary Care Jaeceon Michelin: [name]  Other Clinician: ?Referring Korry Dalgleish: ?Treating Megean Fabio/Extender: [name] ?Polite, Jori Moll ?Weeks in Treatment: 16 ?Complex Wound Management Criteria ?Patient has remarkable or complex co-morbidities requiring medications or treatments that extend wound healing times. Examples: ?Diabetes mellitus with chronic renal failure or end stage renal disease requiring dialysis ?Advanced or poorly controlled rheumatoid arthritis ?Diabetes mellitus and end stage chronic obstructive pulmonary disease ?Active cancer with current chemo- or radiation therapy ?anemia, CHF, SA, CAD, GERD, HTN, PVD, DM type II, OA, degenerative Right hip- nonambulatory ?Palliative Wound Management Criteria ?Care Approach ?Wound Care Plan: Complex Wound Management ?Electronic Signature(s) ?Signed: 02/01/2022 1:51:22 PM By: [name] RN, BSN ?Signed: 02/01/2022 2:11:35 PM By: [name] DO ?Entered By: [name] on 02/01/2022 13:51:21 ?-------------------------------------------------------------------------------- ?Encounter Discharge Information Details ?Patient Name: Date of Service: ?[name], [name] 01/29/2022 10:45 A M ?Medical Record Number: [id number] ?Patient Account Number: [id number] ?Date of Birth/Sex: Treating RN: ?1956-06-21 (66 y.o. [name], Lauren ?Primary Care Hanaa Payes: [name] Other Clinician: ?Referring Samentha Perham: ?Treating Lavonna Lampron/Extender: [name] ?Polite, Jori Moll ?Weeks in Treatment: 16 ?Encounter Discharge Information Items ?Discharge Condition: Stable ?Ambulatory Status: Wheelchair ?Discharge Destination: [place] ?Telephoned: No ?Orders Sent: Yes ?Transportation: Private Auto ?Accompanied By: transport ?Schedule Follow-up Appointment: Yes ?Clinical Summary of Care: Patient Declined ?Electronic Signature(s) ?Signed: 01/29/2022 5:16:05 PM By: Rhae Hammock RN ?Entered By: Rhae Hammock on 01/29/2022  11:14:15 ?-------------------------------------------------------------------------------- ?Lower Extremity Assessment Details ?Patient Name: ?Date of Service: ?[name], [name] 01/29/2022 10:45 A M ?Medical Record Number: [id number] ?Patient Account Number: [id number] ?Date of Birth/Sex: ?Treating

## 2022-02-26 ENCOUNTER — Encounter (HOSPITAL_BASED_OUTPATIENT_CLINIC_OR_DEPARTMENT_OTHER): Payer: Medicare Other | Attending: Internal Medicine | Admitting: Internal Medicine

## 2022-02-26 DIAGNOSIS — I5032 Chronic diastolic (congestive) heart failure: Secondary | ICD-10-CM | POA: Diagnosis not present

## 2022-02-26 DIAGNOSIS — I110 Hypertensive heart disease with heart failure: Secondary | ICD-10-CM | POA: Diagnosis not present

## 2022-02-26 DIAGNOSIS — L97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ed: Secondary | ICD-10-CM | POA: Insufficient documentation

## 2022-02-26 DIAGNOSIS — E1151 Type 2 diabetes mellitus with diabetic peripheral angiopathy without gangrene: Secondary | ICD-10-CM | POA: Diagnosis not present

## 2022-02-26 DIAGNOSIS — Z794 Long term (current) use of insulin: Secondary | ICD-10-CM | POA: Insufficient documentation

## 2022-02-26 DIAGNOSIS — F17200 Nicotine dependence, unspecified, uncomplicated: Secondary | ICD-10-CM | POA: Insufficient documentation

## 2022-02-26 DIAGNOSIS — I87333 Chronic venous hypertension (idiopathic) with ulcer and inflammation of bilateral lower extremity: Secondary | ICD-10-CM | POA: Insufficient documentation

## 2022-02-26 DIAGNOSIS — L97912 Non-pressure chronic ulcer of unspecified part of right lower leg with fat layer exposed: Secondary | ICD-10-CM | POA: Insufficient documentation

## 2022-02-26 DIAGNOSIS — I890 Lymphedema, not elsewhere classified: Secondary | ICD-10-CM | POA: Diagnosis not present

## 2022-02-28 NOTE — Progress Notes (Signed)
[name], [name] ([id number]) Visit Report for 02/26/2022 Arrival Information Details [name], [name] 02/26/2022 1:00 PM Medical Record Number: [id number] Patient Account Number: [id number] Date of Birth/Sex: Treating RN: [birth year] (66 y.o. [name] Primary Care Esty Ahuja: [name] Other Clinician: Referring Jaiden Wahab: Treating Abrianna Sidman/Extender: Darlyn Read in Treatment: 11 Visit Information History Since Last Visit Added or deleted any medications: No Patient Arrived: Wheel Chair Any new allergies or adverse reactions: No Arrival Time: 13:05 Had a fall or experienced change in No Accompanied By: self activities of daily living that may affect Transfer Assistance: Harrel Lemon Lift risk of falls: Patient Identification Verified: Yes Signs or symptoms of abuse/neglect since last visito No Secondary Verification Process Completed: Yes Hospitalized since last visit: No Patient Requires Transmission-Based No Implantable device outside of the clinic excluding No Precautions: cellular tissue based products placed in the center Patient Has Alerts: Yes since last visit: Patient Alerts: Patient on Blood Thinner Has Dressing in Place as Prescribed: Yes *NO BLOOD Has Compression in Place as Prescribed: Yes PRODUCTS* Pain Present Now: Yes Electronic Signature(s) Signed: 02/26/2022 6:06:53 PM By: [name] RN, BSN Entered By: [name] on 02/26/2022 13:10:00 -------------------------------------------------------------------------------- [name], [name] 02/26/2022 1:00 PM Medical Record Number: [id number] Patient Account Number: [id number] Date of Birth/Sex: Treating RN: 1956/03/16 (66 y.o. [name], [name] Primary Care Jarrett Albor: [name] Other Clinician: Referring Alexsandria Kivett: Treating Jayanna Kroeger/Extender: Darlyn Read in  Treatment: 20 Clinic Level of Care Assessment Items TOOL 4 Quantity Score X- 1 0 Use when only an EandM is performed on FOLLOW-UP visit ASSESSMENTS - Nursing Assessment / Reassessment X- 1 10 Reassessment of Co-morbidities (includes updates in patient status) X- 1 5 Reassessment of Adherence to Treatment Plan ASSESSMENTS - Wound and Skin A ssessment / Reassessment '[]'$  - 0 Simple Wound Assessment / Reassessment - one wound X- 2 5 Complex Wound Assessment / Reassessment - multiple wounds '[]'$  - 0 Dermatologic / Skin Assessment (not related to wound area) ASSESSMENTS - Focused Assessment X- 2 5 Circumferential Edema Measurements - multi extremities '[]'$  - 0 Nutritional Assessment / Counseling / Intervention '[]'$  - 0 Lower Extremity Assessment (monofilament, tuning fork, pulses) '[]'$  - 0 Peripheral Arterial Disease Assessment (using hand held doppler) ASSESSMENTS - Ostomy and/or Continence Assessment and Care '[]'$  - 0 Incontinence Assessment and Management '[]'$  - 0 Ostomy Care Assessment and Management (repouching, etc.) PROCESS - Coordination of Care '[]'$  - 0 Simple Patient / Family Education for ongoing care X- 1 20 Complex (extensive) Patient / Family Education for ongoing care X- 1 10 Staff obtains Programmer, systems, Records, T Results / Process Orders est X- 1 10 Staff telephones HHA, Nursing Homes / Clarify orders / etc '[]'$  - 0 Routine Transfer to another Facility (non-emergent condition) '[]'$  - 0 Routine Hospital Admission (non-emergent condition) '[]'$  - 0 New Admissions / Biomedical engineer / Ordering NPWT Apligraf, etc. , '[]'$  - 0 Emergency Hospital Admission (emergent condition) '[]'$  - 0 Simple Discharge Coordination X- 1 15 Complex (extensive) Discharge Coordination PROCESS - Special Needs '[]'$  - 0 Pediatric / Minor Patient Management '[]'$  - 0 Isolation Patient Management '[]'$  - 0 Hearing / Language / Visual special needs '[]'$  - 0 Assessment of Community assistance (transportation,  D/C planning, etc.) '[]'$  - 0 Additional assistance / Altered mentation '[]'$  - 0 Support Surface(s) Assessment (bed, cushion, seat, etc.) INTERVENTIONS - Wound Cleansing / Measurement '[]'$  - 0 Simple  Wound Cleansing - one wound X- 2 5 Complex Wound Cleansing - multiple wounds X- 1 5 Wound Imaging (photographs - any number of wounds) '[]'$  - 0 Wound Tracing (instead of photographs) '[]'$  - 0 Simple Wound Measurement - one wound X- 2 5 Complex Wound Measurement - multiple wounds INTERVENTIONS - Wound Dressings '[]'$  - 0 Small Wound Dressing one or multiple wounds '[]'$  - 0 Medium Wound Dressing one or multiple wounds X- 2 20 Large Wound Dressing one or multiple wounds X- 1 5 Application of Medications - topical '[]'$  - 0 Application of Medications - injection INTERVENTIONS - Miscellaneous '[]'$  - 0 External ear exam '[]'$  - 0 Specimen Collection (cultures, biopsies, blood, body fluids, etc.) '[]'$  - 0 Specimen(s) / Culture(s) sent or taken to Lab for analysis X- 1 10 Patient Transfer (multiple staff / Civil Service fast streamer / Similar devices) '[]'$  - 0 Simple Staple / Suture removal (25 or less) '[]'$  - 0 Complex Staple / Suture removal (26 or more) '[]'$  - 0 Hypo / Hyperglycemic Management (close monitor of Blood Glucose) '[]'$  - 0 Ankle / Brachial Index (ABI) - do not check if billed separately X- 1 5 Vital Signs Has the patient been seen at the hospital within the last three years: Yes Total Score: 175 Level Of Care: New/Established - Level 5 Electronic Signature(s) Signed: 02/28/2022 4:55:17 PM By: Rhae Hammock RN Entered By: Rhae Hammock on 02/26/2022 13:35:06 -------------------------------------------------------------------------------- Encounter Discharge Information Details [name], [name] 02/26/2022 1:00 PM Medical Record Number: [id number] Patient Account Number: [id number] Date of Birth/Sex: Treating RN: 1956-01-08 (66 y.o. [name], [name] Primary Care  Serrita Lueth: [name] Other Clinician: Referring Louden Houseworth: Treating Dawt Reeb/Extender: Darlyn Read in Treatment: 20 Encounter Discharge Information Items Discharge Condition: Stable Ambulatory Status: Wheelchair Discharge Destination: Skilled Nursing Facility Telephoned: No Orders Sent: Yes Transportation: Private Auto Accompanied By: transporter Schedule Follow-up Appointment: Yes Clinical Summary of Care: Patient Declined Electronic Signature(s) Signed: 02/28/2022 4:55:17 PM By: Rhae Hammock RN Entered By: Rhae Hammock on 02/26/2022 13:36:10 -------------------------------------------------------------------------------- Lower Extremity Assessment Details [name], [name] 02/26/2022 1:00 PM Medical Record Number: [id number] Patient Account Number: [id number] Date of Birth/Sex: Treating RN: [birth year] (66 y.o. [name] Primary Care Imoni Kohen: [name] Other Clinician: Referring Tatyana [name]: Treating Ford Peddie/Extender: Darlyn Read in Treatment: 20 Edema Assessment Assessed: Shirlyn Goltz: Yes] Patrice Paradise: Yes] Edema: [Left: Yes] [Right: Yes] Calf Left: Right: Point of Measurement: 37 cm From Medial Instep 38 cm 40 cm Ankle Left: Right: Point of Measurement: 12 cm From Medial Instep 24 cm 24 cm Vascular Assessment Pulses: Dorsalis Pedis Palpable: [Left:Yes] [Right:Yes] Posterior Tibial Palpable: [Left:Yes] [Right:Yes] Electronic Signature(s) Signed: 02/28/2022 4:55:17 PM By: Rhae Hammock RN Entered By: Rhae Hammock on 02/26/2022 13:23:09 -------------------------------------------------------------------------------- Multi Wound Chart Details [name], [name] 02/26/2022 1:00 PM Medical Record Number: [id number] Patient Account Number: [id number] Date of Birth/Sex: Treating RN: 23-Jan-1956 (66 y.o. [name], [name] Primary Care Lonetta Blassingame:  [name] Other Clinician: Referring Eran Mistry: Treating Katryna Tschirhart/Extender: Darlyn Read in Treatment: 20 Vital Signs Height(in): 77 Pulse(bpm): 74 Weight(lbs): 258 Blood Pressure(mmHg): 122/74 Body Mass Index(BMI): 30.6 Temperature(F): 98.4 Respiratory Rate(breaths/min): 16 Photos: [N/A:N/A] Right, Medial Lower Leg Left, Posterior Lower Leg N/A Wound Location: Gradually Appeared Gradually Appeared N/A Wounding Event: Venous Leg Ulcer Venous Leg Ulcer N/A Primary Etiology: Anemia, Sleep Apnea, Congestive Anemia, Sleep Apnea, Congestive N/A Comorbid History: Heart Failure, Coronary Artery Heart Failure,  Coronary Artery Disease, Hypertension, Peripheral Disease, Hypertension, Peripheral Venous Disease, Type II Diabetes, Venous Disease, Type II Diabetes, Osteoarthritis, Neuropathy Osteoarthritis, Neuropathy 09/07/2021 09/07/2021 N/A Date Acquired: 57 20 N/A Weeks of Treatment: Open Open N/A Wound Status: No No N/A Wound Recurrence: 5.4x2.8x0.3 17x14x0.5 N/A Measurements L x W x D (cm) 11.875 186.925 N/A A (cm) : rea 3.563 93.462 N/A Volume (cm) : 1.20% 27.90% N/A % Reduction in Area: 1.20% -20.20% N/A % Reduction in Volume: Full Thickness Without Exposed Full Thickness Without Exposed N/A Classification: Support Structures Support Structures Large Medium N/A Exudate Amount: Serosanguineous Serosanguineous N/A Exudate Type: red, brown red, brown N/A Exudate Color: Distinct, outline attached Distinct, outline attached N/A Wound Margin: Large (67-100%) Large (67-100%) N/A Granulation Amount: Red, Pink Red, Friable N/A Granulation Quality: None Present (0%) None Present (0%) N/A Necrotic Amount: Fat Layer (Subcutaneous Tissue): Yes Fat Layer (Subcutaneous Tissue): Yes N/A Exposed Structures: Fascia: No Fascia: No Tendon: No Tendon: No Muscle: No Muscle: No Joint: No Joint: No Bone: No Bone: No Small (1-33%) Small  (1-33%) N/A Epithelialization: Treatment Notes Electronic Signature(s) Signed: 02/26/2022 5:45:31 PM By: Linton Ham MD Signed: 02/28/2022 4:55:17 PM By: Rhae Hammock RN Entered By: Linton Ham on 02/26/2022 13:33:24 -------------------------------------------------------------------------------- Multi-Disciplinary Care Plan Details [name], [name] 02/26/2022 1:00 PM Medical Record Number: [id number] Patient Account Number: [id number] Date of Birth/Sex: Treating RN: 1956-01-17 (66 y.o. [name], [place] Primary Care Trent Gabler: [name] Other Clinician: Referring Pleasant Bensinger: Treating Ponce Skillman/Extender: Darlyn Read in Treatment: 14 Multidisciplinary Care Plan reviewed with physician Active Inactive Venous Leg Ulcer Nursing Diagnoses: Actual venous Insuffiency (use after diagnosis is confirmed) Goals: Patient will maintain optimal edema control Date Initiated: 10/08/2021 Target Resolution Date: 03/02/2022 Goal Status: Active Interventions: Compression as ordered Provide education on venous insufficiency Treatment Activities: Therapeutic compression applied : 10/08/2021 Notes: Wound/Skin Impairment Nursing Diagnoses: Impaired tissue integrity Goals: Patient/caregiver will verbalize understanding of skin care regimen Date Initiated: 10/08/2021 Target Resolution Date: 03/02/2022 Goal Status: Active Ulcer/skin breakdown will have a volume reduction of 30% by week 4 Date Initiated: 10/08/2021 Date Inactivated: 11/05/2021 Target Resolution Date: 11/05/2021 Goal Status: Unmet Unmet Reason: infection Ulcer/skin breakdown will have a volume reduction of 50% by week 8 Date Initiated: 11/05/2021 Target Resolution Date: 03/02/2022 Goal Status: Active Interventions: Assess patient/caregiver ability to perform ulcer/skin care regimen upon admission and as needed Assess ulceration(s) every visit Provide education on  ulcer and skin care Treatment Activities: Topical wound management initiated : 10/08/2021 Notes: Electronic Signature(s) Signed: 02/28/2022 4:55:17 PM By: Rhae Hammock RN Entered By: Rhae Hammock on 02/26/2022 13:27:30 -------------------------------------------------------------------------------- Pain Assessment Details [name], [name] 02/26/2022 1:00 PM Medical Record Number: [id number] Patient Account Number: [id number] Date of Birth/Sex: Treating RN: Jan 12, 1956 (66 y.o. [name] Primary Care Nyeli Holtmeyer: [name] Other Clinician: Referring Seretha Estabrooks: Treating Gertie Broerman/Extender: Darlyn Read in Treatment: 20 Active Problems Location of Pain Severity and Description of Pain Patient Has Paino Yes Site Locations Pain Location: Generalized Pain, Pain in Ulcers Rate the pain. Current Pain Level: 8 Pain Management and Medication Current Pain Management: Medication: No Cold Application: No Rest: No Massage: No Activity: No T.E.N.S.: No Heat Application: No Leg drop or elevation: No Is the Current Pain Management Adequate: Adequate How does your wound impact your activities of daily livingo Sleep: No Bathing: No Appetite: No Relationship With Others: No Bladder Continence: No Emotions: No Bowel Continence: No Work: No Toileting: No Drive:  No Dressing: No Hobbies: No Electronic Signature(s) Signed: 02/26/2022 6:06:53 PM By: [name] RN, BSN Entered By: [name] on 02/26/2022 13:10:33 -------------------------------------------------------------------------------- Patient/Caregiver Education Details [name], [name] 6/6/2023andnbsp1:00 PM Medical Record Number: [id number] Patient Account Number: [id number] Date of Birth/Gender: Treating RN: 11/09/1955 (66 y.o. [name] Primary Care Physician: [name] Other Clinician: Referring  Physician: Treating Physician/Extender: Darlyn Read in Treatment: 13 Education Assessment Education Provided To: Patient Education Topics Provided Venous: Methods: Explain/Verbal Responses: Reinforcements needed, State content correctly Electronic Signature(s) Signed: 02/28/2022 4:55:17 PM By: Rhae Hammock RN Entered By: Rhae Hammock on 02/26/2022 13:27:45 -------------------------------------------------------------------------------- Wound Assessment Details [name], [name] 02/26/2022 1:00 PM Medical Record Number: [id number] Patient Account Number: [id number] Date of Birth/Sex: Treating RN: 1956-05-12 (66 y.o. [name], [name] Primary Care Phillipa Morden: [name] Other Clinician: Referring Daveion Robar: Treating Cuma Polyakov/Extender: Darlyn Read in Treatment: 20 Wound Status Wound Number: 25 Primary Venous Leg Ulcer Etiology: Wound Location: Right, Medial Lower Leg Wound Open Wounding Event: Gradually Appeared Status: Date Acquired: 09/07/2021 Comorbid Anemia, Sleep Apnea, Congestive Heart Failure, Coronary Artery Weeks Of Treatment: 20 History: Disease, Hypertension, Peripheral Venous Disease, Type II Clustered Wound: No Diabetes, Osteoarthritis, Neuropathy Photos Wound Measurements Length: (cm) 5.4 Width: (cm) 2.8 Depth: (cm) 0.3 Area: (cm) 11.875 Volume: (cm) 3.563 % Reduction in Area: 1.2% % Reduction in Volume: 1.2% Epithelialization: Small (1-33%) Tunneling: No Undermining: No Wound Description Classification: Full Thickness Without Exposed Support Structures Wound Margin: Distinct, outline attached Exudate Amount: Large Exudate Type: Serosanguineous Exudate Color: red, brown Foul Odor After Cleansing: No Slough/Fibrino No Wound Bed Granulation Amount: Large (67-100%) Exposed Structure Granulation Quality: Red, Pink Fascia Exposed: No Necrotic Amount: None  Present (0%) Fat Layer (Subcutaneous Tissue) Exposed: Yes Tendon Exposed: No Muscle Exposed: No Joint Exposed: No Bone Exposed: No Treatment Notes Wound #25 (Lower Leg) Wound Laterality: Right, Medial Cleanser Soap and Water Discharge Instruction: May shower and wash wound with dial antibacterial soap and water prior to dressing change. Wound Cleanser Discharge Instruction: Cleanse the wound with wound cleanser prior to applying a clean dressing using gauze sponges, not tissue or cotton balls. Peri-Wound Care Zinc Oxide Ointment 30g tube Discharge Instruction: Apply Zinc Oxide to periwound with each dressing change Sween Lotion (Moisturizing lotion) Discharge Instruction: Apply moisturizing lotion as directed Topical Primary Dressing Cutimed Sorbact Swab Discharge Instruction: Apply to wound bed as instructed Keystone Antibiotic spray Secondary Dressing ABD Pad, 8x10 Discharge Instruction: Apply over primary dressing as directed. Secured With Compression Wrap Kerlix Roll 4.5x3.1 (in/yd) Discharge Instruction: Apply Kerlix and Coban compression as directed. Coban Self-Adherent Wrap 4x5 (in/yd) Discharge Instruction: Apply over Kerlix as directed. Compression Stockings Add-Ons Electronic Signature(s) Signed: 02/28/2022 4:55:17 PM By: Rhae Hammock RN Entered By: Rhae Hammock on 02/26/2022 13:24:08 -------------------------------------------------------------------------------- Wound Assessment Details [name], [name] 02/26/2022 1:00 PM Medical Record Number: [id number] Patient Account Number: [id number] Date of Birth/Sex: Treating RN: [birth year] (66 y.o. [name] Primary Care Derrian Poli: [name] Other Clinician: Referring Zarius Furr: Treating [place]/Extender: Darlyn Read in Treatment: 20 Wound Status Wound Number: 27 Primary Venous Leg Ulcer Etiology: Wound Location: Left, Posterior Lower  Leg Wound Open Wounding Event: Gradually Appeared Status: Date Acquired: 09/07/2021 Comorbid Anemia, Sleep Apnea, Congestive Heart Failure, Coronary Artery Weeks Of Treatment: 20 History: Disease, Hypertension, Peripheral Venous Disease, Type II Clustered Wound: No Diabetes, Osteoarthritis, Neuropathy Photos Wound Measurements Length: (cm)  17 Width: (cm) 14 Depth: (cm) 0.5 Area: (cm) 186.925 Volume: (cm) 93.462 % Reduction in Area: 27.9% % Reduction in Volume: -20.2% Epithelialization: Small (1-33%) Tunneling: No Undermining: No Wound Description Classification: Full Thickness Without Exposed Support Structures Wound Margin: Distinct, outline attached Exudate Amount: Medium Exudate Type: Serosanguineous Exudate Color: red, brown Foul Odor After Cleansing: No Slough/Fibrino No Wound Bed Granulation Amount: Large (67-100%) Exposed Structure Granulation Quality: Red, Friable Fascia Exposed: No Necrotic Amount: None Present (0%) Fat Layer (Subcutaneous Tissue) Exposed: Yes Tendon Exposed: No Muscle Exposed: No Joint Exposed: No Bone Exposed: No Treatment Notes Wound #27 (Lower Leg) Wound Laterality: Left, Posterior Cleanser Soap and Water Discharge Instruction: May shower and wash wound with dial antibacterial soap and water prior to dressing change. Wound Cleanser Discharge Instruction: Cleanse the wound with wound cleanser prior to applying a clean dressing using gauze sponges, not tissue or cotton balls. Peri-Wound Care Zinc Oxide Ointment 30g tube Discharge Instruction: Apply Zinc Oxide to periwound with each dressing change Sween Lotion (Moisturizing lotion) Discharge Instruction: Apply moisturizing lotion as directed Topical Primary Dressing Cutimed Sorbact Swab Discharge Instruction: Apply to wound bed as instructed Keystone Antibiotic spray Secondary Dressing ABD Pad, 8x10 Discharge Instruction: Apply over primary dressing as directed. Secured  With Compression Wrap Kerlix Roll 4.5x3.1 (in/yd) Discharge Instruction: Apply Kerlix and Coban compression as directed. Coban Self-Adherent Wrap 4x5 (in/yd) Discharge Instruction: Apply over Kerlix as directed. Compression Stockings Add-Ons Electronic Signature(s) Signed: 02/26/2022 6:06:53 PM By: [name] RN, BSN Signed: 02/28/2022 4:55:17 PM By: Rhae Hammock RN Entered By: Rhae Hammock on 02/26/2022 13:23:28 -------------------------------------------------------------------------------- Vitals Details [name], [name] 02/26/2022 1:00 PM Medical Record Number: [id number] Patient Account Number: [id number] Date of Birth/Sex: Treating RN: Mar 28, 1956 (66 y.o. [name] Primary Care Lennix Kneisel: [name] Other Clinician: Referring Christos Mixson: Treating Neola Worrall/Extender: Darlyn Read in Treatment: 20 Vital Signs Time Taken: 13:10 Temperature (F): 98.4 Height (in): 77 Pulse (bpm): 74 Weight (lbs): 258 Respiratory Rate (breaths/min): 16 Body Mass Index (BMI): 30.6 Blood Pressure (mmHg): 122/74 Reference Range: 80 - 120 mg / dl Electronic Signature(s) Signed: 02/26/2022 6:06:53 PM By: [name] RN, BSN Entered By: [name] on 02/26/2022 13:10:21

## 2022-02-28 NOTE — Progress Notes (Signed)
Sinclair, Zameer ([id number]) Visit Report for 02/26/2022 HPI Details [name], [name] 02/26/2022 1:00 PM Medical Record Number: [id number] Patient Account Number: [id number] Date of Birth/Sex: Treating RN: 1955-10-15 (66 y.o. [name] Primary Care Provider: [name] Other Clinician: Referring Provider: Treating Provider/Extender: Darlyn Read in Treatment: 20 History of Present Illness HPI Description: 07/07/15; this is a patient who is in hospital on 8/2 through 8/4. He had cellulitis and abscess of predominantly I think the left leg. He received IV antibiotics. Plain x-ray showed no osteomyelitis. An MRI of the left leg did not show osteomyelitis. Cultures showed no predominant organism. His hemoglobin A1c was 9.8. He has a history of venous stasis also peripheral vascular disease. He was discharged to [facility] home. He really has extensive ulcerations on the left lateral leg including a major wound that communicates both posteriorly and superiorly w. When drainage coming out of 2 smaller areas. He has a smaller wound on the posterior medial left leg. He has more predominantly venous insufficiency wounds on predominantly the right medial leg above the ankle the right foot into sections. He has recently been put on Augmentin at the nursing home. Previous ABIs/arterial evaluation showed triphasic waves diffusely he does not have a major ischemic issue a left lower extremity venous duplex also exam showed no evidence of a DVT on 6/21 07/21/15; the patient arrives with really no major change. Culture I did last time was negative. He has not had a follow-up MRI I ordered. The wounds are macerated covered with a thick gelatinous surface slough. There is necrotic subcutaneous tissue 08/04/15; the patient arrives with a considerable improvement in the majority of his wound area. The area on the left leg now has what looks to be  a granulated base. Most of the wounds on the left leg required an aggressive surgical debridement to remove nonviable fibrinous eschar and subcutaneous tissue however after debridement most of this looks better. Although I had asked for repeat MRI of the left leg when he first came in here with grossly purulent material coming out of his wounds, it does not appear that this is been done and nor do I actually feel that strongly about it right now. He has severe surrounding venous insufficiency and inflammation. I don't believe he has significant PAD 08/25/15. In general there is still a considerable wound area here but with still extensive service adherent slough. The patient will not allow mechanical debridement due to pain. He did not tolerate Medihoney therefore we are left with Santyl for now. She would appear that he has severe surrounding venous stasis. There is no evidence of the infection that may have had something to do with the pathogenesis of these wounds 09/29/15; patient still has substantial wound area on the left leg with a cluster of several wounds on the lateral leg confluently on the left leg posteriorly and then a substantial wound on the medial leg. On the right leg a substantial wound medially and a small area on the right lateral foot. All of these underwent a substantial surgical debridement with curettes which she tolerated better than he has in the past. This started as a complex cellulitis in the face of chronic venous insufficiency and inflammation. 10/13/15; substantial cluster of wounds on the lateral aspect of his left leg confluently around to the other side. Extensive surgical debridement to remove for redness surface slough nonviable subcutaneous tissue. This is not an improvement. Also  substantial wound on the medial right leg which is largely unchanged. The etiology of this was felt to be a complex cellulitis in the summer of 2016 in the face of chronic venous  insufficiency and inflammation. The patient states that the wound on the right medial leg has been there for years off and on. 10/20/15; we are able to start [facility] to these extensive wound areas left greater than right on Tuesday after I spoke to the wound care nurse at the facility. He arrives here today for extensive surgical debridement for the wounds on the lateral aspect of his left leg posterior left leg, this is almost circumferential. He has a large wound on the medial aspect of his right leg although he finds this too painful for debridement 10/27/15; I continue to bring this patient back for frequent debridement/weekly debridement severe. We have been using Hydrofera Blue. Unfortunately this has not really had any improvement. The debridement surgery difficult and painful for the patient 11/23/15; this patient spent a complex hospitalization admitted with acute kidney injury, anemia, cellulitis of the lower extremity, he was felt to have sepsis pathophysiology although his blood cultures were negative. He had plain x-rays of both legs that were negative for osseous abnormalities. He was seen by infectious disease and placed on a workup for vasculitis that was negative including a biopsy 4 all were consistent with stasis dermatitis. Vital broad- spectrum antibiotics he continued to spike fevers. Urine and chest x-ray were negative Dopplers on admission ruled out a DVT All antibiotics were stopped on . 2/17. Since his return to [facility] skilled nursing facility I believe they have been using Xeroform 12/07/15 the patient arrives today with the area on his right medial leg actually looking quite stable. No debridement. The rest of his extensive wounds on the left lateral left posterior extending into the left medial leg all required extensive debridement. I think this is probably going to need to and up in the hands of plastic surgery. We'll attempt to change him back to [facility].  Arrange consultation with plastic surgery at [facility] for this almost circumferential wound on the left side 12/21/15 right leg covered in surface slough. This was debridement. Left leg extensive wounds all carefully examined. This is almost circumferential on the left side especially on the posterior calf. No debridement is necessary. 01/04/16 the patient has been to [facility] plastic surgery. Unfortunately it doesn't look like they had any of the prior workup on this patient. They're in the middle of vascular workup. It sounds as though they're applying Hydrofera Blue at the nursing home. 01/22/16; the patient has been back to see [facility]. There apparently making plans to possibly do skin grafts over his large venous insufficiency ulcerations. The patient tells me that he goes to hematology in [facility] and variably uses the term platelets red cells and the description of his problem. 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 I do not see any major difference in the wound on the right medial leg and circumferentially across the left medial to left lateral leg. Did not attempt to debride these today. Readmission: 05/05/18 on evaluation today patient presents for reevaluation here in our clinic although I have previously seen him in the skilled nursing facility over the past year and a half when I was working in the skilled nursing facility realm instead of covering in the clinic. With that being said during that time we had initiated most  recently dressings with Hydrofera Blue Dressing along with the [employer] which had done very well for him. Much better than the Kerlex Coban wraps. With that being said the patient had been doing fairly well and was making progress when I last saw him. Upon evaluation today it appears that the wounds are actually a little bit worse than when I last saw him although definitely not dramatically so. There does not appear  to be any evidence of infection which is good news. With that being said he has been tolerating the wraps without complication his left hip still gives him a lot of trouble which is his main issue as far as movement is concerned. Unbeknownst to me he has not had venous studies that I can find. He previously told me he had there were arterial studies noted back from 04/27/15 which showed that he had try phasic blood flow throughout and again his test appear to be completely normal as performed by Dr. Creig Hines. With that being said I cannot find where he had venous studies. The patient still states he thinks he did these definitely had no venous intervention at this point. Upon evaluation today it does not appear to me that the patient has any evidence of cellulitis. He does have some chronic venous insufficiency which I think has led to stasis dermatitis but again this is not appearing to be infected at this point. No fevers, chills, nausea, or vomiting noted at this time. READMISSION 06/30/2018 This is a now 66 year old man we have had in this clinic at multiple times in the past. Most recently he came in August and was seen by [facility] stone. It is not clear why he did not come back we asked him really did not get a straight answer. He is at [facility] skilled facility he is a man who has severe chronic venous insufficiency with lymphedema and has had severe wounds on his left greater than right leg. We had him here in 2016 and 17 ultimately referred him to [facility]. He had arterial studies and venous studies done at [facility] although I am not able to access these records I see they are actually done. He also had multiple biopsies that were negative for malignancy showing changes compatible with chronic inflammation. As I understand things in [place] they are using Iodoflex and Unna boots. I am not really sure how they are getting enough Iodoflex for the large wound area especially on his left  calf. Nevertheless the wounds look better than when I saw this in 2017. There were plans for him to have plastic surgery in 2018 and I think he was actually prepped for surgery however he was ultimately denied because the patient was an active smoker. The patient is not systemically unwell. The wounds are painful however given the size especially on the left this is not surprising. ABIs in this clinic were 0.78 on the left and 0.91 on the right 07/13/2018; this is a patient with bilateral severe venous ulcers with secondary lymphedema. The wounds are on the right medial calf and a substantial part of the left posterior calf and some involvement medially and laterally on the left. We changed him to silver alginate under Unna boot's last time. The wounds actually look fairly satisfactory today. 08/14/2018; this is a patient with severe bilateral venous stasis ulcers with secondary lymphedema left greater than right he is at [facility] using silver alginate under Unna boot's I do not think there is  much change on this visit versus last time I saw him a month ago Readmission: 11/04/18 patient presents today for follow-up concerning his bilateral lower extremity ulcers. His a right medial ankle ulcer and a left leg that has ulcers over a large proportion of the surface area between the ankle and knee. Unfortunately this does cause some discomfort for the patient although it doesn't seem to be as uncomfortable as it has been in the past. He is seen today for evaluation after a referral by [place] back to the clinic. Unfortunately has a lot of Slough noted on the surface of his wounds. He's been treated currently it sounds like with Dakin's soaked gauze and they have not been performing the Unna Boot wrap that was previously recommended. I'm not exactly sure what the reason for the change was. He does have less slough on the surface of the wound but again this is something that is often a constant issue for  him. Again he's had these wounds for many years. I've known him for two years at least during that time when I was taking care of them in the facility he is Artie had the wounds for several years prior. READMISSION 02/25/2020 Josepha Pigg is a now 66 year old man. He has been in our clinic several times before most extensively in October 2016 through May 2017. At that time he had absolutely substantial bilateral lower extremity wounds secondary to chronic venous insufficiency and lymphedema. We ultimately referred him to [facility] he had a series of biopsies only showed suggestion of wound secondary to chronic venous insufficiency. He had a less extensive stay in our clinic in 2019 and then a single visit in February 2020. He is a resident at [facility] skilled facility. I think he has had intermittently had in facility wound care. He returns today. They are using silver alginate on the wounds although I am not sure what type of compression. Previously he has favored Unna boots. He is not felt to have an arterial issue. Past medical history; lymphedema and chronic venous insufficiency, diabetes mellitus, hypertension, congestive heart failure We did not do arterial studies today 04/27/2020 on evaluation patient appears to be doing really about the same as when I have known him and seen him in the past. He does have wounds on the right and left legs at this point. Fortunately there is no signs of active infection at this time. 9/2; 1 month follow-up. This is a man with severe chronic venous insufficiency and lymphedema. When I first met him he had horrible circumferential bilateral lower extremity ulcers. We have been using silver alginate up until early last month when it was changed to [facility] and Unna boot's more or less says maintenance dressings. Since the last time he was here the facility where he resides [facility] called to report they could no longer do Unna boot so he is apparently only  been receiving kerlix Coban the left leg is certainly a lot worse with almost circumferential large wounds especially lateral but now medial and posterior as well. He has a standard same looking wound on the right medial lower leg. 10/1; absolutely no improvement here. In fact I do not think anything much is changed. The substantial area on his left lateral and left posterior calf is still open may be slightly larger. He has a more modest wound on the right medial calf. None of these look any different. He comes in with a kerlix Coban wrap this will simply not  be adequate. He has too much edema in this leg He also is complaining a lot of pain in his left heel area. 11/8; potential wounds on the left lateral and posterior calf and a smaller oval wound in the right medial. We have been using Hydrofera Blue under 4-layer compression. He was using Unna boots still 2 months ago the facility called to say they can no longer place these so we have been using 4-layer compression. The surface area of the area on the left is so large there is very few alternatives to what we can use on this either silver alginate or Hydrofera Blue. He was complaining of pain on the left heel last time I asked for an x-ray this was apparently done although we do not have the result. He is not complaining of pain today. 08/16/2020 on evaluation today patient is is seen for a early visit due to the fact that he apparently is having issues I was told when they called on Monday with a MRSA infection that they felt like we needed to see him for because his legs "looked horrible". With that being said based on what I see at this point it does not appear that the patient is actually having a terrible infection in fact compared to last time I saw him his legs do not appear to be doing too poorly at all at this time. Nonetheless he has been on doxycycline that is not going to be a good option for him based on the culture report that I  have for review today which graded as a partial report with still several organisms pending as far as identification is concerned we did contact them but again they do not have the final report yet. Either way based on what we see it appears that doxycycline is one of the few medications that will not work for the Citrobacter. Obviously I think that a different medication would be a good option for him since there is a gram-negative organism pending I am likely can I suggest Cipro as the best of backup antibiotic to switch him to at this point. All this was discussed with the patient today. 12/20; not much change in the substantial wound on the left posterior calf and the small area on the right medial. He has a new area on the left anterior which looks like a wrap injury. Had some thoughts about doing a deep tissue culture here although we will put that off for next time. Using silver alginate as a primary dressing 2/10; substantial area on the left posterior calf. This measures smaller but still is a very large area. He has the area on the right anterior medial which also is a fairly large wound but much smaller than not on the left. His edema control is quite good. We have been using silver alginate because of the size of the wounds. 4/7; substantial wound on the left posterior calf and a smaller area on the right medial lower leg. These are very chronic wounds which we have classified is venous. Previously worked up at [facility] for 5 years ago at which time I had sent and will send him over for consideration of extensive skin graft I know they biopsied this many times but he never had plastic surgery. The wound area is much too large for standard size dressings we have been using silver alginate but we did give him a good trial last fall of Hydrofera Blue that did not make any difference  either. If anything the area on the right medial lower leg over time has gotten a lot smaller as has the area on  the left although it still quite substantial now. Readmission 10/08/2021 Mr. Smaran Gaus is a 67 year old male with a past medical history of insulin-dependent type 2 diabetes with last hemoglobin A1c of 10.2, chronic venous insufficiency, lymphedema and chronic diastolic heart failure that presents to the clinic for bilateral lower extremity wounds. He has been followed in our clinic previously for the past 6 years for these issues. He was last seen in April 2022. He is inconsistent with his appointments in our clinic. These wounds have not healed over the past few years. He is currently using silver alginate to the wound beds and keeping these covered with Kerlix. He currently denies systemic signs of infection. He was recently hospitalized for septic shock secondary to bacteremia from likely chronic leg wounds And is currently on oral antibiotics to be completed this week. 1/30; patient presents for follow-up. He has no issues or complaints today. 2/6; patient presents for follow-up. He reports more discomfort with the increase in compression wraps. He currently denies systemic signs of infection and feels well overall. 2/13; patient presents for follow-up. He reports tolerating the current leg/Coban wrap well. He denies systemic signs of infection and feels fine overall. 2/21; patient presents for follow-up. He has no issues or complaints today. He tolerated the kirlex/Coban wrap. He denies systemic signs of infection. 3/21; patient presents for follow-up. He has no issues or complaints today. He saw infectious disease, Dr. Megan Salon. Nothing further to do from an infectious disease point. Levaquin was stopped. He currently denies systemic signs of infection. 4/4; patient presents for follow-up. His facility was able to obtain the [facility] antibiotic and has started using this with dressing changes/wrap changes. Patient has no issues or complaints today. He denies signs of infection. 4/18; patient  presents for follow-up. He has no issues or complaints today. He denies signs of infection. 5/9; patient presents for follow-up. He has been using Keystone antibiotic under Kerlix/Coban. He denies signs of infection today. 6/6; monthly follow-up. Patient is at [facility] skilled facility. He has been using Keystone probably silver alginate under kerlix Coban. We use Sorbact here. Wound condition has improved Electronic Signature(s) Signed: 02/26/2022 5:45:31 PM By: Linton Ham MD Entered By: Linton Ham on 02/26/2022 13:34:11 -------------------------------------------------------------------------------- Physical Exam Details [name], [name] 02/26/2022 1:00 PM Medical Record Number: [id number] Patient Account Number: [id number] Date of Birth/Sex: Treating RN: 05-03-1956 (66 y.o. [name] Primary Care Provider: [name] Other Clinician: Referring Provider: Treating Provider/Extender: Darlyn Read in Treatment: 20 Constitutional Sitting or standing Blood Pressure is within target range for patient.. Pulse regular and within target range for patient.Marland Kitchen Respirations regular, non-labored and within target range.. Temperature is normal and within the target range for the patient.Marland Kitchen Appears in no distress. Cardiovascular Edema control is adequate. Notes Wound exam; right lower extremity; small area medially this was once a substantial wound area. Surface of this looks healthy no debridement is required On the left lower extremity most of this is now posterior however once again this is smaller with a better looking surface. His edema control is adequate with the kerlix Coban Electronic Signature(s) Signed: 02/26/2022 5:45:31 PM By: Linton Ham MD Entered By: Linton Ham on 02/26/2022 13:36:43 -------------------------------------------------------------------------------- Physician Orders Details Patient Name:  Date of Service: [name], [name] 02/26/2022 1:00 PM Medical Record Number:  [id number] Patient Account Number: [id number] Date of Birth/Sex: Treating RN: Jan 22, 1956 (66 y.o. [name] Primary Care Provider: [name] Other Clinician: Referring Provider: Treating Provider/Extender: Darlyn Read in Treatment: 20 Verbal / Phone Orders: No Diagnosis Coding Follow-up Appointments Return appointment in 1 month. - Dr. Heber [place] w/ [facility] Room #9 04/02/22 @ 1300 *****Extra Time**** 75 minutes - ***HOYER*** No appt.'s after 11:00 and after 3:00!!**** Other: - Pt. to bring in [place] to every [facility] appt. please!!:) Bathing/ Shower/ Hygiene May shower and wash wound with soap and water. - when changing dressing Edema Control - Lymphedema / SCD / Other Elevate legs to the level of the heart or above for 30 minutes daily and/or when sitting, a frequency of: - throughout the day Moisturize legs daily. - with dressing changes Additional Orders / Instructions Follow Nutritious Diet - Monitor/Control Blood Sugars-Continue using ProStat Other: - Go to emergency room for fever, chills, strong odor from wounds, increased pain in wounds Wound Treatment Wound #25 - Lower Leg Wound Laterality: Right, Medial Cleanser: Soap and Water 3 x Per Week/30 Days Discharge Instructions: May shower and wash wound with dial antibacterial soap and water prior to dressing change. Cleanser: Wound Cleanser 3 x Per Week/30 Days Discharge Instructions: Cleanse the wound with wound cleanser prior to applying a clean dressing using gauze sponges, not tissue or cotton balls. Peri-Wound Care: Zinc Oxide Ointment 30g tube 3 x Per Week/30 Days Discharge Instructions: Apply Zinc Oxide to periwound with each dressing change Peri-Wound Care: Sween Lotion (Moisturizing lotion) 3 x Per Week/30 Days Discharge Instructions: Apply moisturizing lotion as directed Prim Dressing: Cutimed Sorbact Swab 3 x Per  Week/30 Days ary Discharge Instructions: Apply to wound bed as instructed Prim Dressing: Keystone Antibiotic spray ary 3 x Per Week/30 Days Secondary Dressing: ABD Pad, 8x10 3 x Per Week/30 Days Discharge Instructions: Apply over primary dressing as directed. Compression Wrap: Kerlix Roll 4.5x3.1 (in/yd) 3 x Per Week/30 Days Discharge Instructions: Apply Kerlix and Coban compression as directed. Compression Wrap: Coban Self-Adherent Wrap 4x5 (in/yd) 3 x Per Week/30 Days Discharge Instructions: Apply over Kerlix as directed. Wound #27 - Lower Leg Wound Laterality: Left, Posterior Cleanser: Soap and Water 3 x Per Week/30 Days Discharge Instructions: May shower and wash wound with dial antibacterial soap and water prior to dressing change. Cleanser: Wound Cleanser 3 x Per Week/30 Days Discharge Instructions: Cleanse the wound with wound cleanser prior to applying a clean dressing using gauze sponges, not tissue or cotton balls. Peri-Wound Care: Zinc Oxide Ointment 30g tube 3 x Per Week/30 Days Discharge Instructions: Apply Zinc Oxide to periwound with each dressing change Peri-Wound Care: Sween Lotion (Moisturizing lotion) 3 x Per Week/30 Days Discharge Instructions: Apply moisturizing lotion as directed Prim Dressing: Cutimed Sorbact Swab 3 x Per Week/30 Days ary Discharge Instructions: Apply to wound bed as instructed Prim Dressing: Keystone Antibiotic spray ary 3 x Per Week/30 Days Secondary Dressing: ABD Pad, 8x10 3 x Per Week/30 Days Discharge Instructions: Apply over primary dressing as directed. Compression Wrap: Kerlix Roll 4.5x3.1 (in/yd) 3 x Per Week/30 Days Discharge Instructions: Apply Kerlix and Coban compression as directed. Compression Wrap: Coban Self-Adherent Wrap 4x5 (in/yd) 3 x Per Week/30 Days Discharge Instructions: Apply over Kerlix as directed. Electronic Signature(s) Signed: 02/26/2022 5:45:31 PM By: Linton Ham MD Signed: 02/28/2022 4:55:17 PM By: Rhae Hammock RN Entered By: Rhae Hammock on 02/26/2022 13:30:37 -------------------------------------------------------------------------------- Problem List Details [name], [name] 02/26/2022 1:00 PM  Medical Record Number: [id number] Patient Account Number: [id number] Date of Birth/Sex: Treating RN: 10-13-1955 (66 y.o. [name], Lauren Primary Care Provider: [name] Other Clinician: Referring Provider: Treating Provider/Extender: Darlyn Read in Treatment: 20 Active Problems ICD-10 Encounter Code Description Active Date MDM Diagnosis I87.333 Chronic venous hypertension (idiopathic) with ulcer and inflammation of 10/08/2021 No Yes bilateral lower extremity 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ed1/16/2023 No Yes L97.912 Non-pressure chronic ulcer of unspecified part of right lower leg with fat layer 10/08/2021 No Yes exposed I89.0 Lymphedema, not elsewhere classified 10/08/2021 No Yes Inactive Problems Resolved Problems Electronic Signature(s) Signed: 02/26/2022 5:45:31 PM By: Linton Ham MD Entered By: Linton Ham on 02/26/2022 13:33:17 -------------------------------------------------------------------------------- Progress Note Details [name], [name] 02/26/2022 1:00 PM Medical Record Number: [id number] Patient Account Number: [id number] Date of Birth/Sex: Treating RN: 1956-01-26 (66 y.o. [name] Primary Care Provider: [name] Other Clinician: Referring Provider: Treating Provider/Extender: Darlyn Read in Treatment: 20 Subjective History of Present Illness (HPI) 07/07/15; this is a patient who is in hospital on 8/2 through 8/4. He had cellulitis and abscess of predominantly I think the left leg. He received IV antibiotics. Plain x-ray showed no osteomyelitis. An MRI of the left leg did not  show osteomyelitis. Cultures showed no predominant organism. His hemoglobin A1c was 9.8. He has a history of venous stasis also peripheral vascular disease. He was discharged to [facility]. He really has extensive ulcerations on the left lateral leg including a major wound that communicates both posteriorly and superiorly w. When drainage coming out of 2 smaller areas. He has a smaller wound on the posterior medial left leg. He has more predominantly venous insufficiency wounds on predominantly the right medial leg above the ankle the right foot into sections. He has recently been put on Augmentin at the nursing home. Previous ABIs/arterial evaluation showed triphasic waves diffusely he does not have a major ischemic issue a left lower extremity venous duplex also exam showed no evidence of a DVT on 6/21 07/21/15; the patient arrives with really no major change. Culture I did last time was negative. He has not had a follow-up MRI I ordered. The wounds are macerated covered with a thick gelatinous surface slough. There is necrotic subcutaneous tissue 08/04/15; the patient arrives with a considerable improvement in the majority of his wound area. The area on the left leg now has what looks to be a granulated base. Most of the wounds on the left leg required an aggressive surgical debridement to remove nonviable fibrinous eschar and subcutaneous tissue however after debridement most of this looks better. Although I had asked for repeat MRI of the left leg when he first came in here with grossly purulent material coming out of his wounds, it does not appear that this is been done and nor do I actually feel that strongly about it right now. He has severe surrounding venous insufficiency and inflammation. I don't believe he has significant PAD 08/25/15. In general there is still a considerable wound area here but with still extensive service adherent slough. The patient will not allow  mechanical debridement due to pain. He did not tolerate Medihoney therefore we are left with Santyl for now. She would appear that he has severe surrounding venous stasis. There is no evidence of the infection that may have had something to do with the pathogenesis of these wounds 09/29/15;  patient still has substantial wound area on the left leg with a cluster of several wounds on the lateral leg confluently on the left leg posteriorly and then a substantial wound on the medial leg. On the right leg a substantial wound medially and a small area on the right lateral foot. All of these underwent a substantial surgical debridement with curettes which she tolerated better than he has in the past. This started as a complex cellulitis in the face of chronic venous insufficiency and inflammation. 10/13/15; substantial cluster of wounds on the lateral aspect of his left leg confluently around to the other side. Extensive surgical debridement to remove for redness surface slough nonviable subcutaneous tissue. This is not an improvement. Also substantial wound on the medial right leg which is largely unchanged. The etiology of this was felt to be a complex cellulitis in the summer of 2016 in the face of chronic venous insufficiency and inflammation. The patient states that the wound on the right medial leg has been there for years off and on. 10/20/15; we are able to start [facility] to these extensive wound areas left greater than right on Tuesday after I spoke to the wound care nurse at the facility. He arrives here today for extensive surgical debridement for the wounds on the lateral aspect of his left leg posterior left leg, this is almost circumferential. He has a large wound on the medial aspect of his right leg although he finds this too painful for debridement 10/27/15; I continue to bring this patient back for frequent debridement/weekly debridement severe. We have been using Hydrofera Blue.  Unfortunately this has not really had any improvement. The debridement surgery difficult and painful for the patient 11/23/15; this patient spent a complex hospitalization admitted with acute kidney injury, anemia, cellulitis of the lower extremity, he was felt to have sepsis pathophysiology although his blood cultures were negative. He had plain x-rays of both legs that were negative for osseous abnormalities. He was seen by infectious disease and placed on a workup for vasculitis that was negative including a biopsy o4 all were consistent with stasis dermatitis. Vital broad-spectrum antibiotics he continued to spike fevers. Urine and chest x-ray were negative Dopplers on admission ruled out a DVT All antibiotics were stopped on 2/17. . Since his return to [facility] skilled nursing facility I believe they have been using Xeroform 12/07/15 the patient arrives today with the area on his right medial leg actually looking quite stable. No debridement. The rest of his extensive wounds on the left lateral left posterior extending into the left medial leg all required extensive debridement. I think this is probably going to need to and up in the hands of plastic surgery. We'll attempt to change him back to  [facility]. Arrange consultation with plastic surgery at [facility] for this almost circumferential wound on the left side 12/21/15 right leg covered in surface slough. This was debridement. Left leg extensive wounds all carefully examined. This is almost circumferential on the left side especially on the posterior calf. No debridement is necessary. 01/04/16 the patient has been to [facility] plastic surgery. Unfortunately it doesn't look like they had any of the prior workup on this patient. They're in the middle of vascular workup. It sounds as though they're applying Hydrofera Blue at the nursing home. 01/22/16; the patient has been back to see [facility]. There apparently making plans to possibly do  skin grafts over his large venous insufficiency ulcerations. The patient tells me that he goes  to hematology in [facility] and variably uses the term platelets red cells and the description of his problem. 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 I do not see any major difference in the wound on the right medial leg and circumferentially across the left medial to left lateral leg. Did not attempt to debride these today. Readmission: 05/05/18 on evaluation today patient presents for reevaluation here in our clinic although I have previously seen him in the skilled nursing facility over the past year and a half when I was working in the skilled nursing facility realm instead of covering in the clinic. With that being said during that time we had initiated most recently dressings with Hydrofera Blue Dressing along with the [employer] which had done very well for him. Much better than the Kerlex Coban wraps. With that being said the patient had been doing fairly well and was making progress when I last saw him. Upon evaluation today it appears that the wounds are actually a little bit worse than when I last saw him although definitely not dramatically so. There does not appear to be any evidence of infection which is good news. With that being said he has been tolerating the wraps without complication his left hip still gives him a lot of trouble which is his main issue as far as movement is concerned. Unbeknownst to me he has not had venous studies that I can find. He previously told me he had there were arterial studies noted back from 04/27/15 which showed that he had try phasic blood flow throughout and again his test appear to be completely normal as performed by Dr. Creig Hines. With that being said I cannot find where he had venous studies. The patient still states he thinks he did these definitely had no venous intervention at this point. Upon evaluation  today it does not appear to me that the patient has any evidence of cellulitis. He does have some chronic venous insufficiency which I think has led to stasis dermatitis but again this is not appearing to be infected at this point. No fevers, chills, nausea, or vomiting noted at this time. READMISSION 06/30/2018 This is a now 66 year old man we have had in this clinic at multiple times in the past. Most recently he came in August and was seen by [facility]. It is not clear why he did not come back we asked him really did not get a straight answer. He is at [facility] skilled facility he is a man who has severe chronic venous insufficiency with lymphedema and has had severe wounds on his left greater than right leg. We had him here in 2016 and 17 ultimately referred him to [facility]. He had arterial studies and venous studies done at [facility] although I am not able to access these records I see they are actually done. He also had multiple biopsies that were negative for malignancy showing changes compatible with chronic inflammation. As I understand things in [place] they are using Iodoflex and Unna boots. I am not really sure how they are getting enough Iodoflex for the large wound area especially on his left calf. Nevertheless the wounds look better than when I saw this in 2017. There were plans for him to have plastic surgery in 2018 and I think he was actually prepped for surgery however he was ultimately denied because the patient was an active smoker. The patient is not systemically unwell.  The wounds are painful however given the size especially on the left this is not surprising. ABIs in this clinic were 0.78 on the left and 0.91 on the right 07/13/2018; this is a patient with bilateral severe venous ulcers with secondary lymphedema. The wounds are on the right medial calf and a substantial part of the left posterior calf and some involvement medially and laterally on the left. We  changed him to silver alginate under Unna boot's last time. The wounds actually look fairly satisfactory today. 08/14/2018; this is a patient with severe bilateral venous stasis ulcers with secondary lymphedema left greater than right he is at [facility] using silver alginate under Unna boot's I do not think there is much change on this visit versus last time I saw him a month ago Readmission: 11/04/18 patient presents today for follow-up concerning his bilateral lower extremity ulcers. His a right medial ankle ulcer and a left leg that has ulcers over a large proportion of the surface area between the ankle and knee. Unfortunately this does cause some discomfort for the patient although it doesn't seem to be as uncomfortable as it has been in the past. He is seen today for evaluation after a referral by [place] back to the clinic. Unfortunately has a lot of Slough noted on the surface of his wounds. He's been treated currently it sounds like with Dakin's soaked gauze and they have not been performing the Unna Boot wrap that was previously recommended. I'm not exactly sure what the reason for the change was. He does have less slough on the surface of the wound but again this is something that is often a constant issue for him. Again he's had these wounds for many years. I've known him for two years at least during that time when I was taking care of them in the facility he is Artie had the wounds for several years prior. READMISSION 02/25/2020 Josepha Pigg is a now 66 year old man. He has been in our clinic several times before most extensively in October 2016 through May 2017. At that time he had absolutely substantial bilateral lower extremity wounds secondary to chronic venous insufficiency and lymphedema. We ultimately referred him to [facility] he had a series of biopsies only showed suggestion of wound secondary to chronic venous insufficiency. He had a less extensive stay in our clinic in 2019  and then a single visit in February 2020. He is a resident at [facility] skilled facility. I think he has had intermittently had in facility wound care. He returns today. They are using silver alginate on the wounds although I am not sure what type of compression. Previously he has favored Unna boots. He is not felt to have an arterial issue. Past medical history; lymphedema and chronic venous insufficiency, diabetes mellitus, hypertension, congestive heart failure We did not do arterial studies today 04/27/2020 on evaluation patient appears to be doing really about the same as when I have known him and seen him in the past. He does have wounds on the right and left legs at this point. Fortunately there is no signs of active infection at this time. 9/2; 1 month follow-up. This is a man with severe chronic venous insufficiency and lymphedema. When I first met him he had horrible circumferential bilateral lower extremity ulcers. We have been using silver alginate up until early last month when it was changed to [facility] and Unna boot's more or less says maintenance dressings. Since the last time he was here the  facility where he resides [facility] called to report they could no longer do Unna boot so he is apparently only been receiving kerlix Coban the left leg is certainly a lot worse with almost circumferential large wounds especially lateral but now medial and posterior as well. He has a standard same looking wound on the right medial lower leg. 10/1; absolutely no improvement here. In fact I do not think anything much is changed. The substantial area on his left lateral and left posterior calf is still open may be slightly larger. He has a more modest wound on the right medial calf. None of these look any different. He comes in with a kerlix Coban wrap this will simply not be adequate. He has too much edema in this leg He also is complaining a lot of pain in his left heel area. 11/8;  potential wounds on the left lateral and posterior calf and a smaller oval wound in the right medial. We have been using Hydrofera Blue under 4-layer compression. He was using Unna boots still 2 months ago the facility called to say they can no longer place these so we have been using 4-layer compression. The surface area of the area on the left is so large there is very few alternatives to what we can use on this either silver alginate or Hydrofera Blue. He was complaining of pain on the left heel last time I asked for an x-ray this was apparently done although we do not have the result. He is not complaining of pain today. 08/16/2020 on evaluation today patient is is seen for a early visit due to the fact that he apparently is having issues I was told when they called on Monday with a MRSA infection that they felt like we needed to see him for because his legs "looked horrible". With that being said based on what I see at this point it does not appear that the patient is actually having a terrible infection in fact compared to last time I saw him his legs do not appear to be doing too poorly at all at this time. Nonetheless he has been on doxycycline that is not going to be a good option for him based on the culture report that I have for review today which graded as a partial report with still several organisms pending as far as identification is concerned we did contact them but again they do not have the final report yet. Either way based on what we see it appears that doxycycline is one of the few medications that will not work for the Citrobacter. Obviously I think that a different medication would be a good option for him since there is a gram-negative organism pending I am likely can I suggest Cipro as the best of backup antibiotic to switch him to at this point. All this was discussed with the patient today. 12/20; not much change in the substantial wound on the left posterior calf and the  small area on the right medial. He has a new area on the left anterior which looks like a wrap injury. Had some thoughts about doing a deep tissue culture here although we will put that off for next time. Using silver alginate as a primary dressing 2/10; substantial area on the left posterior calf. This measures smaller but still is a very large area. He has the area on the right anterior medial which also is a fairly large wound but much smaller than not on the left.  His edema control is quite good. We have been using silver alginate because of the size of the wounds. 4/7; substantial wound on the left posterior calf and a smaller area on the right medial lower leg. These are very chronic wounds which we have classified is venous. Previously worked up at [facility] for 5 years ago at which time I had sent and will send him over for consideration of extensive skin graft I know they biopsied this many times but he never had plastic surgery. The wound area is much too large for standard size dressings we have been using silver alginate but we did give him a good trial last fall of Hydrofera Blue that did not make any difference either. If anything the area on the right medial lower leg over time has gotten a lot smaller as has the area on the left although it still quite substantial now. Readmission 10/08/2021 Mr. Masiah Woody is a 66 year old male with a past medical history of insulin-dependent type 2 diabetes with last hemoglobin A1c of 10.2, chronic venous insufficiency, lymphedema and chronic diastolic heart failure that presents to the clinic for bilateral lower extremity wounds. He has been followed in our clinic previously for the past 6 years for these issues. He was last seen in April 2022. He is inconsistent with his appointments in our clinic. These wounds have not healed over the past few years. He is currently using silver alginate to the wound beds and keeping these covered with Kerlix. He  currently denies systemic signs of infection. He was recently hospitalized for septic shock secondary to bacteremia from likely chronic leg wounds And is currently on oral antibiotics to be completed this week. 1/30; patient presents for follow-up. He has no issues or complaints today. 2/6; patient presents for follow-up. He reports more discomfort with the increase in compression wraps. He currently denies systemic signs of infection and feels well overall. 2/13; patient presents for follow-up. He reports tolerating the current leg/Coban wrap well. He denies systemic signs of infection and feels fine overall. 2/21; patient presents for follow-up. He has no issues or complaints today. He tolerated the kirlex/Coban wrap. He denies systemic signs of infection. 3/21; patient presents for follow-up. He has no issues or complaints today. He saw infectious disease, Dr. Megan Salon. Nothing further to do from an infectious disease point. Levaquin was stopped. He currently denies systemic signs of infection. 4/4; patient presents for follow-up. His facility was able to obtain the [facility] antibiotic and has started using this with dressing changes/wrap changes. Patient has no issues or complaints today. He denies signs of infection. 4/18; patient presents for follow-up. He has no issues or complaints today. He denies signs of infection. 5/9; patient presents for follow-up. He has been using Keystone antibiotic under Kerlix/Coban. He denies signs of infection today. 6/6; monthly follow-up. Patient is at [facility] skilled facility. He has been using Keystone probably silver alginate under kerlix Coban. We use Sorbact here. Wound condition has improved Objective Constitutional Sitting or standing Blood Pressure is within target range for patient.. Pulse regular and within target range for patient.Marland Kitchen Respirations regular, non-labored and within target range.. Temperature is normal and within the target range  for the patient.Marland Kitchen Appears in no distress. Vitals Time Taken: 1:10 PM, Height: 77 in, Weight: 258 lbs, BMI: 30.6, Temperature: 98.4 F, Pulse: 74 bpm, Respiratory Rate: 16 breaths/min, Blood Pressure: 122/74 mmHg. Cardiovascular Edema control is adequate. General Notes: Wound exam; right lower extremity; small area medially this was once  a substantial wound area. Surface of this looks healthy no debridement is required On the left lower extremity most of this is now posterior however once again this is smaller with a better looking surface. His edema control is adequate with the kerlix Coban Integumentary (Hair, Skin) Wound #25 status is Open. Original cause of wound was Gradually Appeared. The date acquired was: 09/07/2021. The wound has been in treatment 20 weeks. The wound is located on the Right,Medial Lower Leg. The wound measures 5.4cm length x 2.8cm width x 0.3cm depth; 11.875cm^2 area and 3.563cm^3 volume. There is Fat Layer (Subcutaneous Tissue) exposed. There is no tunneling or undermining noted. There is a large amount of serosanguineous drainage noted. The wound margin is distinct with the outline attached to the wound base. There is large (67-100%) red, pink granulation within the wound bed. There is no necrotic tissue within the wound bed. Wound #27 status is Open. Original cause of wound was Gradually Appeared. The date acquired was: 09/07/2021. The wound has been in treatment 20 weeks. The wound is located on the Left,Posterior Lower Leg. The wound measures 17cm length x 14cm width x 0.5cm depth; 186.925cm^2 area and 93.462cm^3 volume. There is Fat Layer (Subcutaneous Tissue) exposed. There is no tunneling or undermining noted. There is a medium amount of serosanguineous drainage noted. The wound margin is distinct with the outline attached to the wound base. There is large (67-100%) red, friable granulation within the wound bed. There is no necrotic tissue within the wound  bed. 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 - Dr. Heber [place] w/ [place] Room #9 04/02/22 @ 1300 *****Extra Time**** 75 minutes - ***HOYER*** No appt.'s after 11:00 and after 3:00!!**** Other: - Pt. to bring in [place] to every [facility] appt. please!!:) Bathing/ Shower/ Hygiene: May shower and wash wound with soap and water. - when changing dressing Edema Control - Lymphedema / SCD / Other: Elevate legs to the level of the heart or above for 30 minutes daily and/or when sitting, a frequency of: - throughout the day Moisturize legs daily. - with dressing changes Additional Orders / Instructions: Follow Nutritious Diet - Monitor/Control Blood Sugars-Continue using ProStat Other: - Go to emergency room for fever, chills, strong odor from wounds, increased pain in wounds WOUND #25: - Lower Leg Wound Laterality: Right, Medial Cleanser: Soap and Water 3 x Per Week/30 Days Discharge Instructions: May shower and wash wound with dial antibacterial soap and water prior to dressing change. Cleanser: Wound Cleanser 3 x Per Week/30 Days Discharge Instructions: Cleanse the wound with wound cleanser prior to applying a clean dressing using gauze sponges, not tissue or cotton balls. Peri-Wound Care: Zinc Oxide Ointment 30g tube 3 x Per Week/30 Days Discharge Instructions: Apply Zinc Oxide to periwound with each dressing change Peri-Wound Care: Sween Lotion (Moisturizing lotion) 3 x Per Week/30 Days Discharge Instructions: Apply moisturizing lotion as directed Prim Dressing: Cutimed Sorbact Swab 3 x Per Week/30 Days ary Discharge Instructions: Apply to  wound bed as instructed Prim Dressing: Keystone Antibiotic spray 3 x Per Week/30 Days ary Secondary Dressing: ABD Pad, 8x10 3 x Per Week/30 Days Discharge Instructions: Apply over primary dressing as directed. Com pression Wrap: Kerlix Roll 4.5x3.1 (in/yd) 3 x  Per Week/30 Days Discharge Instructions: Apply Kerlix and Coban compression as directed. Com pression Wrap: Coban Self-Adherent Wrap 4x5 (in/yd) 3 x Per Week/30 Days Discharge Instructions: Apply over Kerlix as directed. WOUND #27: - Lower Leg Wound Laterality: Left, Posterior Cleanser: Soap and Water 3 x Per Week/30 Days Discharge Instructions: May shower and wash wound with dial antibacterial soap and water prior to dressing change. Cleanser: Wound Cleanser 3 x Per Week/30 Days Discharge Instructions: Cleanse the wound with wound cleanser prior to applying a clean dressing using gauze sponges, not tissue or cotton balls. Peri-Wound Care: Zinc Oxide Ointment 30g tube 3 x Per Week/30 Days Discharge Instructions: Apply Zinc Oxide to periwound with each dressing change Peri-Wound Care: Sween Lotion (Moisturizing lotion) 3 x Per Week/30 Days Discharge Instructions: Apply moisturizing lotion as directed Prim Dressing: Cutimed Sorbact Swab 3 x Per Week/30 Days ary Discharge Instructions: Apply to wound bed as instructed Prim Dressing: Keystone Antibiotic spray 3 x Per Week/30 Days ary Secondary Dressing: ABD Pad, 8x10 3 x Per Week/30 Days Discharge Instructions: Apply over primary dressing as directed. Com pression Wrap: Kerlix Roll 4.5x3.1 (in/yd) 3 x Per Week/30 Days Discharge Instructions: Apply Kerlix and Coban compression as directed. Com pression Wrap: Coban Self-Adherent Wrap 4x5 (in/yd) 3 x Per Week/30 Days Discharge Instructions: Apply over Kerlix as directed. 1. In view of the improvement here there is no indication to change the primary dressing Keystone and Sorbact here probably silver alginate in the facility. Kerlix and  Coban wrap 2. These are chronic wounds that been present for years although quite an improvement since the last time I saw these Electronic Signature(s) Signed: 02/26/2022 5:45:31 PM By: Linton Ham MD Entered By: Linton Ham on 02/26/2022 13:37:27 -------------------------------------------------------------------------------- SuperBill Details [name], [name] 02/26/2022 Medical Record Number: [id number] Patient Account Number: [id number] Date of Birth/Sex: Treating RN: [birth year] (66 y.o. 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 I89.0 Lymphedema, not elsewhere classified Facility Procedures CPT4 Code: [id number] Description: [phone] - WOUND CARE VISIT-LEV 5 EST PT Modifier: Quantity: 1 Physician Procedures : CPT4 Code Description Modifier [id number] - WC PHYS LEVEL 3 - EST PT ICD-10 Diagnosis Description I87.333 Chronic venous hypertension (idiopathic) with ulcer and inflammation of bilateral lower extremity L97.822 Non-pressure chronic ulcer of other  part of left lower leg with fat layer exposed L97.912 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

## 2022-04-02 ENCOUNTER — Encounter (HOSPITAL_BASED_OUTPATIENT_CLINIC_OR_DEPARTMENT_OTHER): Payer: Medicare Other | Attending: Internal Medicine | Admitting: Internal Medicine

## 2022-04-02 DIAGNOSIS — E1151 Type 2 diabetes mellitus with diabetic peripheral angiopathy without gangrene: Secondary | ICD-10-CM | POA: Insufficient documentation

## 2022-04-02 DIAGNOSIS — I110 Hypertensive heart disease with heart failure: Secondary | ICD-10-CM | POA: Diagnosis not present

## 2022-04-02 DIAGNOSIS — I890 Lymphedema, not elsewhere classified: Secondary | ICD-10-CM | POA: Diagnosis not present

## 2022-04-02 DIAGNOSIS — F17210 Nicotine dependence, cigarettes, uncomplicated: Secondary | ICD-10-CM | POA: Insufficient documentation

## 2022-04-02 DIAGNOSIS — L97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ed: Secondary | ICD-10-CM | POA: Diagnosis not present

## 2022-04-02 DIAGNOSIS — L97912 Non-pressure chronic ulcer of unspecified part of right lower leg with fat layer exposed: Secondary | ICD-10-CM | POA: Insufficient documentation

## 2022-04-02 DIAGNOSIS — I87333 Chronic venous hypertension (idiopathic) with ulcer and inflammation of bilateral lower extremity: Secondary | ICD-10-CM | POA: Diagnosis present

## 2022-04-03 NOTE — Progress Notes (Signed)
Spires, Skylar ([id number]) Visit Report for 04/02/2022 Arrival Information Details [name], [name] 04/02/2022 1:00 PM Medical Record Number: [id number] Patient Account Number: [id number] Date of Birth/Sex: Treating RN: April 14, 1956 (66 y.o. [name], Lauren Primary Care Salar Molden: [name] Other Clinician: Referring Rashana Andrew: Treating Jeannifer Drakeford/Extender: [name] in Treatment: 25 Visit Information History Since Last Visit Added or deleted any medications: No Patient Arrived: Wheel Chair Any new allergies or adverse reactions: No Arrival Time: 13:06 Had a fall or experienced change in No Accompanied By: transporter activities of daily living that may affect Transfer Assistance: Manual risk of falls: Patient Identification Verified: Yes Signs or symptoms of abuse/neglect since last visito No Secondary Verification Process Completed: Yes Hospitalized since last visit: No Patient Requires Transmission-Based No Implantable device outside of the clinic excluding No Precautions: cellular tissue based products placed in the center Patient Has Alerts: Yes since last visit: Patient Alerts: Patient on Blood Thinner Has Dressing in Place as Prescribed: Yes *NO BLOOD Pain Present Now: Yes PRODUCTS* Electronic Signature(s) Signed: 04/02/2022 4:44:26 PM By: Rhae Hammock RN Entered By: Rhae Hammock on 04/02/2022 13:12:00 -------------------------------------------------------------------------------- [name], [name] 04/02/2022 1:00 PM Medical Record Number: [id number] Patient Account Number: [id number] Date of Birth/Sex: Treating RN: [birth year] (66 y.o. [name], [place] Primary Care Jarell Mcewen: [name] Other Clinician: Referring Bryony Kaman: Treating Maelle Sheaffer/Extender: [name] in Treatment: 25 Clinic Level of  Care Assessment Items TOOL 4 Quantity Score X- 1 0 Use when only an EandM is performed on FOLLOW-UP visit ASSESSMENTS - Nursing Assessment / Reassessment X- 1 10 Reassessment of Co-morbidities (includes updates in patient status) X- 1 5 Reassessment of Adherence to Treatment Plan ASSESSMENTS - Wound and Skin A ssessment / Reassessment '[]'$  - 0 Simple Wound Assessment / Reassessment - one wound X- 2 5 Complex Wound Assessment / Reassessment - multiple wounds '[]'$  - 0 Dermatologic / Skin Assessment (not related to wound area) ASSESSMENTS - Focused Assessment X- 2 5 Circumferential Edema Measurements - multi extremities '[]'$  - 0 Nutritional Assessment / Counseling / Intervention '[]'$  - 0 Lower Extremity Assessment (monofilament, tuning fork, pulses) '[]'$  - 0 Peripheral Arterial Disease Assessment (using hand held doppler) ASSESSMENTS - Ostomy and/or Continence Assessment and Care '[]'$  - 0 Incontinence Assessment and Management '[]'$  - 0 Ostomy Care Assessment and Management (repouching, etc.) PROCESS - Coordination of Care '[]'$  - 0 Simple Patient / Family Education for ongoing care X- 1 20 Complex (extensive) Patient / Family Education for ongoing care X- 1 10 Staff obtains Programmer, systems, Records, T Results / Process Orders est '[]'$  - 0 Staff telephones HHA, Nursing Homes / Clarify orders / etc '[]'$  - 0 Routine Transfer to another Facility (non-emergent condition) '[]'$  - 0 Routine Hospital Admission (non-emergent condition) '[]'$  - 0 New Admissions / Biomedical engineer / Ordering NPWT Apligraf, etc. , '[]'$  - 0 Emergency Hospital Admission (emergent condition) '[]'$  - 0 Simple Discharge Coordination X- 1 15 Complex (extensive) Discharge Coordination PROCESS - Special Needs '[]'$  - 0 Pediatric / Minor Patient Management '[]'$  - 0 Isolation Patient Management '[]'$  - 0 Hearing / Language / Visual special needs '[]'$  - 0 Assessment of Community assistance (transportation, D/C planning, etc.) '[]'$  -  0 Additional assistance / Altered mentation '[]'$  - 0 Support Surface(s) Assessment (bed, cushion, seat, etc.) INTERVENTIONS - Wound Cleansing / Measurement '[]'$  - 0 Simple Wound Cleansing - one wound X- 2 5 Complex  Wound Cleansing - multiple wounds X- 1 5 Wound Imaging (photographs - any number of wounds) '[]'$  - 0 Wound Tracing (instead of photographs) '[]'$  - 0 Simple Wound Measurement - one wound X- 2 5 Complex Wound Measurement - multiple wounds INTERVENTIONS - Wound Dressings '[]'$  - 0 Small Wound Dressing one or multiple wounds X- 2 15 Medium Wound Dressing one or multiple wounds '[]'$  - 0 Large Wound Dressing one or multiple wounds X- 1 5 Application of Medications - topical '[]'$  - 0 Application of Medications - injection INTERVENTIONS - Miscellaneous '[]'$  - 0 External ear exam '[]'$  - 0 Specimen Collection (cultures, biopsies, blood, body fluids, etc.) '[]'$  - 0 Specimen(s) / Culture(s) sent or taken to Lab for analysis X- 1 10 Patient Transfer (multiple staff / Civil Service fast streamer / Similar devices) '[]'$  - 0 Simple Staple / Suture removal (25 or less) '[]'$  - 0 Complex Staple / Suture removal (26 or more) '[]'$  - 0 Hypo / Hyperglycemic Management (close monitor of Blood Glucose) '[]'$  - 0 Ankle / Brachial Index (ABI) - do not check if billed separately X- 1 5 Vital Signs Has the patient been seen at the hospital within the last three years: Yes Total Score: 155 Level Of Care: New/Established - Level 4 Electronic Signature(s) Signed: 04/02/2022 4:44:26 PM By: Rhae Hammock RN Entered By: Rhae Hammock on 04/02/2022 14:17:53 -------------------------------------------------------------------------------- Encounter Discharge Information Details [name], [name] 04/02/2022 1:00 PM Medical Record Number: [id number] Patient Account Number: [id number] Date of Birth/Sex: Treating RN: 1956/08/16 (66 y.o. [name], Lauren Primary Care Lanetta Figuero: [name] Other  Clinician: Referring Jasmaine Rochel: Treating Dailah Opperman/Extender: [name] in Treatment: 25 Encounter Discharge Information Items Discharge Condition: Stable Ambulatory Status: Wheelchair Discharge Destination: Skilled Nursing Facility Telephoned: No Orders Sent: Yes Transportation: Private Auto Accompanied By: Elenor Quinones Schedule Follow-up Appointment: Yes Clinical Summary of Care: Patient Declined Electronic Signature(s) Signed: 04/02/2022 4:44:26 PM By: Rhae Hammock RN Entered By: Rhae Hammock on 04/02/2022 14:59:49 -------------------------------------------------------------------------------- Lower Extremity Assessment Details [name], [name] 04/02/2022 1:00 PM Medical Record Number: [id number] Patient Account Number: [id number] Date of Birth/Sex: Treating RN: [birth year] (66 y.o. [name] Primary Care Ector Laurel: [name] Other Clinician: Referring Jesslynn Kruck: Treating Diannah Rindfleisch/Extender: [name] in Treatment: 25 Edema Assessment Assessed: Shirlyn Goltz: No] Patrice Paradise: No] Edema: [Left: Yes] [Right: Yes] Calf Left: Right: Point of Measurement: 37 cm From Medial Instep 40 cm 41 cm Ankle Left: Right: Point of Measurement: 12 cm From Medial Instep 25.4 cm 26.5 cm Vascular Assessment Pulses: Dorsalis Pedis Palpable: [Right:Yes] Posterior Tibial Palpable: [Right:Yes] Electronic Signature(s) Signed: 04/02/2022 4:44:26 PM By: Rhae Hammock RN Entered By: Rhae Hammock on 04/02/2022 13:24:40 -------------------------------------------------------------------------------- Multi Wound Chart Details [name], [name] 04/02/2022 1:00 PM Medical Record Number: [id number] Patient Account Number: [id number] Date of Birth/Sex: Treating RN: 05-Apr-1956 (66 y.o. [name], Lauren Primary Care Kino Dunsworth: [name] Other Clinician: Referring  Jarryn Altland: Treating Cythnia Osmun/Extender: [name] in Treatment: 25 Vital Signs Height(in): 77 Capillary Blood Glucose(mg/dl): 181 Weight(lbs): 258 Pulse(bpm): 50 Body Mass Index(BMI): 30.6 Blood Pressure(mmHg): 131/74 Temperature(F): 98.3 Respiratory Rate(breaths/min): 17 Photos: [27:No Photos] [N/A:N/A] Right, Medial Lower Leg Left, Posterior Lower Leg N/A Wound Location: Gradually Appeared Gradually Appeared N/A Wounding Event: Venous Leg Ulcer Venous Leg Ulcer N/A Primary Etiology: Anemia, Sleep Apnea, Congestive Anemia, Sleep Apnea, Congestive N/A Comorbid History: Heart Failure, Coronary Artery Heart Failure, Coronary Artery Disease, Hypertension, Peripheral  Disease, Hypertension, Peripheral Venous Disease, Type II Diabetes, Venous Disease, Type II Diabetes, Osteoarthritis, Neuropathy Osteoarthritis, Neuropathy 09/07/2021 09/07/2021 N/A Date Acquired: 25 25 N/A Weeks of Treatment: Open Open N/A Wound Status: No No N/A Wound Recurrence: 5.3x2.8x0.3 17.5x14x0.4 N/A Measurements L x W x D (cm) 11.655 192.423 N/A A (cm) : rea 3.497 76.969 N/A Volume (cm) : 3.00% 25.80% N/A % Reduction in Area: 3.00% 1.00% N/A % Reduction in Volume: Full Thickness Without Exposed Full Thickness Without Exposed N/A Classification: Support Structures Support Structures Large Medium N/A Exudate Amount: Serosanguineous Serosanguineous N/A Exudate Type: red, brown red, brown N/A Exudate Color: Distinct, outline attached Distinct, outline attached N/A Wound Margin: Large (67-100%) Large (67-100%) N/A Granulation Amount: Red, Pink Red, Friable N/A Granulation Quality: None Present (0%) None Present (0%) N/A Necrotic Amount: Fat Layer (Subcutaneous Tissue): Yes Fat Layer (Subcutaneous Tissue): Yes N/A Exposed Structures: Fascia: No Fascia: No Tendon: No Tendon: No Muscle: No Muscle: No Joint: No Joint: No Bone: No Bone: No Small (1-33%)  Small (1-33%) N/A Epithelialization: Treatment Notes Wound #25 (Lower Leg) Wound Laterality: Right, Medial Cleanser Soap and Water Discharge Instruction: May shower and wash wound with dial antibacterial soap and water prior to dressing change. Wound Cleanser Discharge Instruction: Cleanse the wound with wound cleanser prior to applying a clean dressing using gauze sponges, not tissue or cotton balls. Peri-Wound Care Zinc Oxide Ointment 30g tube Discharge Instruction: Apply Zinc Oxide to periwound with each dressing change Sween Lotion (Moisturizing lotion) Discharge Instruction: Apply moisturizing lotion as directed Topical Primary Dressing Cutimed Sorbact Swab Discharge Instruction: Apply to wound bed as instructed Keystone Antibiotic spray Secondary Dressing ABD Pad, 8x10 Discharge Instruction: Apply over primary dressing as directed. Secured With Compression Wrap Kerlix Roll 4.5x3.1 (in/yd) Discharge Instruction: Apply Kerlix and Coban compression as directed. Coban Self-Adherent Wrap 4x5 (in/yd) Discharge Instruction: Apply over Kerlix as directed. Compression Stockings Add-Ons Wound #27 (Lower Leg) Wound Laterality: Left, Posterior Cleanser Soap and Water Discharge Instruction: May shower and wash wound with dial antibacterial soap and water prior to dressing change. Wound Cleanser Discharge Instruction: Cleanse the wound with wound cleanser prior to applying a clean dressing using gauze sponges, not tissue or cotton balls. Peri-Wound Care Zinc Oxide Ointment 30g tube Discharge Instruction: Apply Zinc Oxide to periwound with each dressing change Sween Lotion (Moisturizing lotion) Discharge Instruction: Apply moisturizing lotion as directed Topical Primary Dressing Cutimed Sorbact Swab Discharge Instruction: Apply to wound bed as instructed Keystone Antibiotic spray Secondary Dressing ABD Pad, 8x10 Discharge Instruction: Apply over primary dressing as  directed. Secured With Compression Wrap Kerlix Roll 4.5x3.1 (in/yd) Discharge Instruction: Apply Kerlix and Coban compression as directed. Coban Self-Adherent Wrap 4x5 (in/yd) Discharge Instruction: Apply over Kerlix as directed. Compression Stockings Add-Ons Electronic Signature(s) Signed: 04/02/2022 4:44:26 PM By: Rhae Hammock RN Signed: 04/03/2022 11:30:51 AM By: [name] DO Entered By: [name] on 04/02/2022 15:59:27 -------------------------------------------------------------------------------- Multi-Disciplinary Care Plan Details [name], [name] 04/02/2022 1:00 PM Medical Record Number: [id number] Patient Account Number: [id number] Date of Birth/Sex: Treating RN: 08-16-1956 (66 y.o. [name] Primary Care Kambree Krauss: [name] Other Clinician: Referring Donnel Venuto: Treating Adhira Jamil/Extender: [name] in Treatment: 25 Multidisciplinary Care Plan reviewed with physician Active Inactive Venous Leg Ulcer Nursing Diagnoses: Actual venous Insuffiency (use after diagnosis is confirmed) Goals: Patient will maintain optimal edema control Date Initiated: 10/08/2021 Target Resolution Date: 03/02/2022 Goal Status: Active Interventions: Compression as ordered Provide education on venous insufficiency Treatment Activities: Therapeutic compression  applied : 10/08/2021 Notes: Wound/Skin Impairment Nursing Diagnoses: Impaired tissue integrity Goals: Patient/caregiver will verbalize understanding of skin care regimen Date Initiated: 10/08/2021 Target Resolution Date: 03/02/2022 Goal Status: Active Ulcer/skin breakdown will have a volume reduction of 30% by week 4 Date Initiated: 10/08/2021 Date Inactivated: 11/05/2021 Target Resolution Date: 11/05/2021 Goal Status: Unmet Unmet Reason: infection Ulcer/skin breakdown will have a volume reduction of 50% by week 8 Date Initiated:  11/05/2021 Target Resolution Date: 03/02/2022 Goal Status: Active Interventions: Assess patient/caregiver ability to perform ulcer/skin care regimen upon admission and as needed Assess ulceration(s) every visit Provide education on ulcer and skin care Treatment Activities: Topical wound management initiated : 10/08/2021 Notes: Electronic Signature(s) Signed: 04/02/2022 4:44:26 PM By: Rhae Hammock RN Entered By: Rhae Hammock on 04/02/2022 14:12:12 -------------------------------------------------------------------------------- Pain Assessment Details [name], [name] 04/02/2022 1:00 PM Medical Record Number: [id number] Patient Account Number: [id number] Date of Birth/Sex: Treating RN: [birth year] (66 y.o. [name] Primary Care Khy Pitre: [name] Other Clinician: Referring Anahit Klumb: Treating Linnea Todisco/Extender: [name] in Treatment: 25 Active Problems Location of Pain Severity and Description of Pain Patient Has Paino Yes Site Locations With Dressing Change: Yes Duration of the Pain. Constant / Intermittento Constant Rate the pain. Current Pain Level: 8 Worst Pain Level: 10 Least Pain Level: 0 Tolerable Pain Level: 8 Character of Pain Describe the Pain: Aching Pain Management and Medication Current Pain Management: Medication: Yes Cold Application: No Rest: Yes Massage: No Activity: No T.E.N.S.: No Heat Application: No Leg drop or elevation: No Is the Current Pain Management Adequate: Adequate How does your wound impact your activities of daily livingo Sleep: No Bathing: No Appetite: No Relationship With Others: No Bladder Continence: No Emotions: No Bowel Continence: No Work: No Toileting: No Drive: No Dressing: No Hobbies: No Electronic Signature(s) Signed: 04/02/2022 4:44:26 PM By: Rhae Hammock RN Entered By: Rhae Hammock on 04/02/2022  13:13:02 -------------------------------------------------------------------------------- Patient/Caregiver Education Details [name], [name] 7/11/2023andnbsp1:00 PM Medical Record Number: [id number] Patient Account Number: [id number] Date of Birth/Gender: Treating RN: 03/15/1956 (66 y.o. [name] Primary Care Physician: [name] Other Clinician: Referring Physician: Treating Physician/Extender: [name] in Treatment: 25 Education Assessment Education Provided To: Patient Education Topics Provided Wound/Skin Impairment: Methods: Explain/Verbal Responses: Reinforcements needed, State content correctly [employer]) Signed: 04/02/2022 4:44:26 PM By: Rhae Hammock RN Entered By: Rhae Hammock on 04/02/2022 14:12:49 -------------------------------------------------------------------------------- Wound Assessment Details [name], [name] 04/02/2022 1:00 PM Medical Record Number: [id number] Patient Account Number: [id number] Date of Birth/Sex: Treating RN: 02-16-1956 (66 y.o. [name], Lauren Primary Care Adien Kimmel: [name] Other Clinician: Referring Francile Woolford: Treating Gordon Carlson/Extender: [name] in Treatment: 25 Wound Status Wound Number: 25 Primary Venous Leg Ulcer Etiology: Wound Location: Right, Medial Lower Leg Wound Open Wounding Event: Gradually Appeared Status: Date Acquired: 09/07/2021 Comorbid Anemia, Sleep Apnea, Congestive Heart Failure, Coronary Artery Weeks Of Treatment: 25 History: Disease, Hypertension, Peripheral Venous Disease, Type II Clustered Wound: No Diabetes, Osteoarthritis, Neuropathy Photos Wound Measurements Length: (cm) 5.3 Width: (cm) 2.8 Depth: (cm) 0.3 Area: (cm) 11.655 Volume: (cm) 3.497 % Reduction in Area: 3% % Reduction in Volume: 3% Epithelialization: Small  (1-33%) Tunneling: No Undermining: No Wound Description Classification: Full Thickness Without Exposed Support Structures Wound Margin: Distinct, outline attached Exudate Amount: Large Exudate Type: Serosanguineous Exudate Color: red, brown Foul Odor After Cleansing: No Slough/Fibrino No Wound Bed Granulation Amount: Large (67-100%)  Exposed Structure Granulation Quality: Red, Pink Fascia Exposed: No Necrotic Amount: None Present (0%) Fat Layer (Subcutaneous Tissue) Exposed: Yes Tendon Exposed: No Muscle Exposed: No Joint Exposed: No Bone Exposed: No Treatment Notes Wound #25 (Lower Leg) Wound Laterality: Right, Medial Cleanser Soap and Water Discharge Instruction: May shower and wash wound with dial antibacterial soap and water prior to dressing change. Wound Cleanser Discharge Instruction: Cleanse the wound with wound cleanser prior to applying a clean dressing using gauze sponges, not tissue or cotton balls. Peri-Wound Care Zinc Oxide Ointment 30g tube Discharge Instruction: Apply Zinc Oxide to periwound with each dressing change Sween Lotion (Moisturizing lotion) Discharge Instruction: Apply moisturizing lotion as directed Topical Primary Dressing Cutimed Sorbact Swab Discharge Instruction: Apply to wound bed as instructed Keystone Antibiotic spray Secondary Dressing ABD Pad, 8x10 Discharge Instruction: Apply over primary dressing as directed. Secured With Compression Wrap Kerlix Roll 4.5x3.1 (in/yd) Discharge Instruction: Apply Kerlix and Coban compression as directed. Coban Self-Adherent Wrap 4x5 (in/yd) Discharge Instruction: Apply over Kerlix as directed. Compression Stockings Add-Ons Electronic Signature(s) Signed: 04/02/2022 4:44:26 PM By: Rhae Hammock RN Entered By: Rhae Hammock on 04/02/2022 13:31:22 -------------------------------------------------------------------------------- Wound Assessment Details Patient Name: Date of Service: HEA  RD, [name] 04/02/2022 1:00 PM Medical Record Number: [id number] Patient Account Number: [id number] Date of Birth/Sex: Treating RN: 1956/05/20 (66 y.o. [name], Lauren Primary Care Aftan Vint: [name] Other Clinician: Referring Tatiana Courter: Treating Murial Beam/Extender: [name] in Treatment: 25 Wound Status Wound Number: 27 Primary Venous Leg Ulcer Etiology: Wound Location: Left, Posterior Lower Leg Wound Open Wounding Event: Gradually Appeared Status: Date Acquired: 09/07/2021 Comorbid Anemia, Sleep Apnea, Congestive Heart Failure, Coronary Artery Weeks Of Treatment: 25 History: Disease, Hypertension, Peripheral Venous Disease, Type II Clustered Wound: No Diabetes, Osteoarthritis, Neuropathy Wound Measurements Length: (cm) 17.5 Width: (cm) 14 Depth: (cm) 0.4 Area: (cm) 192.423 Volume: (cm) 76.969 % Reduction in Area: 25.8% % Reduction in Volume: 1% Epithelialization: Small (1-33%) Tunneling: No Undermining: No Wound Description Classification: Full Thickness Without Exposed Support Structures Wound Margin: Distinct, outline attached Exudate Amount: Medium Exudate Type: Serosanguineous Exudate Color: red, brown Foul Odor After Cleansing: No Slough/Fibrino No Wound Bed Granulation Amount: Large (67-100%) Exposed Structure Granulation Quality: Red, Friable Fascia Exposed: No Necrotic Amount: None Present (0%) Fat Layer (Subcutaneous Tissue) Exposed: Yes Tendon Exposed: No Muscle Exposed: No Joint Exposed: No Bone Exposed: No Treatment Notes Wound #27 (Lower Leg) Wound Laterality: Left, Posterior Cleanser Soap and Water Discharge Instruction: May shower and wash wound with dial antibacterial soap and water prior to dressing change. Wound Cleanser Discharge Instruction: Cleanse the wound with wound cleanser prior to applying a clean dressing using gauze sponges, not tissue or cotton balls. Peri-Wound Care Zinc Oxide Ointment 30g  tube Discharge Instruction: Apply Zinc Oxide to periwound with each dressing change Sween Lotion (Moisturizing lotion) Discharge Instruction: Apply moisturizing lotion as directed Topical Primary Dressing Cutimed Sorbact Swab Discharge Instruction: Apply to wound bed as instructed Keystone Antibiotic spray Secondary Dressing ABD Pad, 8x10 Discharge Instruction: Apply over primary dressing as directed. Secured With Compression Wrap Kerlix Roll 4.5x3.1 (in/yd) Discharge Instruction: Apply Kerlix and Coban compression as directed. Coban Self-Adherent Wrap 4x5 (in/yd) Discharge Instruction: Apply over Kerlix as directed. Compression Stockings Add-Ons Electronic Signature(s) Signed: 04/02/2022 4:44:26 PM By: Rhae Hammock RN Entered By: Rhae Hammock on 04/02/2022 13:53:35 -------------------------------------------------------------------------------- Vitals Details [name], [name] 04/02/2022 1:00 PM Medical Record Number: [id number] Patient Account Number: [id number] Date of Birth/Sex: Treating RN: [birth year] (66  y.o. [name], Lauren Primary Care Mitchelle Sultan: [name] Other Clinician: Referring Angee Gupton: Treating Taijah Macrae/Extender: [name] in Treatment: 25 Vital Signs Time Taken: 13:13 Temperature (F): 98.3 Height (in): 77 Pulse (bpm): 74 Weight (lbs): 258 Respiratory Rate (breaths/min): 17 Body Mass Index (BMI): 30.6 Blood Pressure (mmHg): 131/74 Capillary Blood Glucose (mg/dl): 181 Reference Range: 80 - 120 mg / dl Electronic Signature(s) Signed: 04/02/2022 4:44:26 PM By: Rhae Hammock RN Entered By: Rhae Hammock on 04/02/2022 13:13:57

## 2022-04-03 NOTE — Progress Notes (Signed)
[name] ([id number]) Visit Report for 04/02/2022 Chief Complaint Document Details Patient Name: Date of Service: [name] 04/02/2022 1:00 PM Medical Record Number: [id number] Patient Account Number: [id number] Date of Birth/Sex: Treating RN: 1955/12/12 (66 y.o. [name], [name] Primary Care Provider: [name] Other Clinician: Referring Provider: Treating Provider/Extender: [name] in Treatment: 25 Information Obtained from: Patient Chief Complaint 10/08/2021; bilateral lower extremity wounds Electronic Signature(s) Signed: 04/03/2022 11:30:51 AM By: [name] DO Entered By: [name] on 04/02/2022 15:59:34 -------------------------------------------------------------------------------- HPI Details Patient Name: Date of Service: [name] 04/02/2022 1:00 PM Medical Record Number: [id number] Patient Account Number: [id number] Date of Birth/Sex: Treating RN: 07/12/1956 (66 y.o. [name] Primary Care Provider: [name] Other Clinician: Referring Provider: Treating Provider/Extender: [name] in Treatment: 25 History of Present Illness HPI Description: 07/07/15; this is a patient who is in hospital on 8/2 through 8/4. He had cellulitis and abscess of predominantly I think the left leg. He received IV antibiotics. Plain x-ray showed no osteomyelitis. An MRI of the left leg did not show osteomyelitis. Cultures showed no predominant organism. His hemoglobin A1c was 9.8. He has a history of venous stasis also peripheral vascular disease. He was discharged to [facility]. He really has extensive ulcerations on the left lateral leg including a major wound that communicates both posteriorly and superiorly w. When drainage coming out of 2 smaller areas. He has a smaller wound on the posterior medial left leg. He has more predominantly venous insufficiency wounds on  predominantly the right medial leg above the ankle the right foot into sections. He has recently been put on Augmentin at the nursing home. Previous ABIs/arterial evaluation showed triphasic waves diffusely he does not have a major ischemic issue a left lower extremity venous duplex also exam showed no evidence of a DVT on 6/21 07/21/15; the patient arrives with really no major change. Culture I did last time was negative. He has not had a follow-up MRI I ordered. The wounds are macerated covered with a thick gelatinous surface slough. There is necrotic subcutaneous tissue 08/04/15; the patient arrives with a considerable improvement in the majority of his wound area. The area on the left leg now has what looks to be a granulated base. Most of the wounds on the left leg required an aggressive surgical debridement to remove nonviable fibrinous eschar and subcutaneous tissue however after debridement most of this looks better. Although I had asked for repeat MRI of the left leg when he first came in here with grossly purulent material coming out of his wounds, it does not appear that this is been done and nor do I actually feel that strongly about it right now. He has severe surrounding venous insufficiency and inflammation. I don't believe he has significant PAD 08/25/15. In general there is still a considerable wound area here but with still extensive service adherent slough. The patient will not allow mechanical debridement due to pain. He did not tolerate Medihoney therefore we are left with Santyl for now. She would appear that he has severe surrounding venous stasis. There is no evidence of the infection that may have had something to do with the pathogenesis of these wounds 09/29/15; patient still has substantial wound area on the left leg with a cluster of several wounds on the lateral leg confluently on the left leg posteriorly and then a substantial wound on the medial leg. On the right leg a  substantial wound medially and a small area on the right lateral foot. All of these underwent a substantial surgical debridement with curettes which she tolerated better than he has in the past. This started as a complex cellulitis in the face of chronic venous insufficiency and inflammation. 10/13/15; substantial cluster of wounds on the lateral aspect of his left leg confluently around to the other side. Extensive surgical debridement to remove for redness surface slough nonviable subcutaneous tissue. This is not an improvement. Also substantial wound on the medial right leg which is largely unchanged. The etiology of this was felt to be a complex cellulitis in the summer of 2016 in the face of chronic venous insufficiency and inflammation. The patient states that the wound on the right medial leg has been there for years off and on. 10/20/15; we are able to start [facility] to these extensive wound areas left greater than right on Tuesday after I spoke to the wound care nurse at the facility. He arrives here today for extensive surgical debridement for the wounds on the lateral aspect of his left leg posterior left leg, this is almost circumferential. He has a large wound on the medial aspect of his right leg although he finds this too painful for debridement 10/27/15; I continue to bring this patient back for frequent debridement/weekly debridement severe. We have been using Hydrofera Blue. Unfortunately this has not really had any improvement. The debridement surgery difficult and painful for the patient 11/23/15; this patient spent a complex hospitalization admitted with acute kidney injury, anemia, cellulitis of the lower extremity, he was felt to have sepsis pathophysiology although his blood cultures were negative. He had plain x-rays of both legs that were negative for osseous abnormalities. He was seen by infectious disease and placed on a workup for vasculitis that was negative including a  biopsy 4 all were consistent with stasis dermatitis. Vital broad- spectrum antibiotics he continued to spike fevers. Urine and chest x-ray were negative Dopplers on admission ruled out a DVT All antibiotics were stopped on . 2/17. Since his return to [facility] skilled nursing facility I believe they have been using Xeroform 12/07/15 the patient arrives today with the area on his right medial leg actually looking quite stable. No debridement. The rest of his extensive wounds on the left lateral left posterior extending into the left medial leg all required extensive debridement. I think this is probably going to need to and up in the hands of plastic surgery. We'll attempt to change him back to [facility]. Arrange consultation with plastic surgery at [facility] for this almost circumferential wound on the left side 12/21/15 right leg covered in surface slough. This was debridement. Left leg extensive wounds all carefully examined. This is almost circumferential on the left side especially on the posterior calf. No debridement is necessary. 01/04/16 the patient has been to [facility] plastic surgery. Unfortunately it doesn't look like they had any of the prior workup on this patient. They're in the middle of vascular workup. It sounds as though they're applying Hydrofera Blue at the nursing home. 01/22/16; the patient has been back to see [facility]. There apparently making plans to possibly do skin grafts over his large venous insufficiency ulcerations. The patient tells me that he goes to hematology in [facility] and variably uses the term platelets red cells and the description of his problem. 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 I do not see any major difference  in the wound on the right medial leg and circumferentially across the left medial to left lateral leg. Did not attempt to debride these today. Readmission: 05/05/18 on evaluation today patient  presents for reevaluation here in our clinic although I have previously seen him in the skilled nursing facility over the past year and a half when I was working in the skilled nursing facility realm instead of covering in the clinic. With that being said during that time we had initiated most recently dressings with Hydrofera Blue Dressing along with the [employer] which had done very well for him. Much better than the Kerlex Coban wraps. With that being said the patient had been doing fairly well and was making progress when I last saw him. Upon evaluation today it appears that the wounds are actually a little bit worse than when I last saw him although definitely not dramatically so. There does not appear to be any evidence of infection which is good news. With that being said he has been tolerating the wraps without complication his left hip still gives him a lot of trouble which is his main issue as far as movement is concerned. Unbeknownst to me he has not had venous studies that I can find. He previously told me he had there were arterial studies noted back from 04/27/15 which showed that he had try phasic blood flow throughout and again his test appear to be completely normal as performed by Dr. Creig Hines. With that being said I cannot find where he had venous studies. The patient still states he thinks he did these definitely had no venous intervention at this point. Upon evaluation today it does not appear to me that the patient has any evidence of cellulitis. He does have some chronic venous insufficiency which I think has led to stasis dermatitis but again this is not appearing to be infected at this point. No fevers, chills, nausea, or vomiting noted at this time. READMISSION 06/30/2018 This is a now 66 year old man we have had in this clinic at multiple times in the past. Most recently he came in August and was seen by [facility] stone. It is not clear why he did not come back we asked him  really did not get a straight answer. He is at [facility] skilled facility he is a man who has severe chronic venous insufficiency with lymphedema and has had severe wounds on his left greater than right leg. We had him here in 2016 and 17 ultimately referred him to [facility]. He had arterial studies and venous studies done at [facility] although I am not able to access these records I see they are actually done. He also had multiple biopsies that were negative for malignancy showing changes compatible with chronic inflammation. As I understand things in [place] they are using Iodoflex and Unna boots. I am not really sure how they are getting enough Iodoflex for the large wound area especially on his left calf. Nevertheless the wounds look better than when I saw this in 2017. There were plans for him to have plastic surgery in 2018 and I think he was actually prepped for surgery however he was ultimately denied because the patient was an active smoker. The patient is not systemically unwell. The wounds are painful however given the size especially on the left this is not surprising. ABIs in this clinic were 0.78 on the left and 0.91 on the right 07/13/2018; this is a patient with bilateral severe venous  ulcers with secondary lymphedema. The wounds are on the right medial calf and a substantial part of the left posterior calf and some involvement medially and laterally on the left. We changed him to silver alginate under Unna boot's last time. The wounds actually look fairly satisfactory today. 08/14/2018; this is a patient with severe bilateral venous stasis ulcers with secondary lymphedema left greater than right he is at [facility] using silver alginate under Unna boot's I do not think there is much change on this visit versus last time I saw him a month ago Readmission: 11/04/18 patient presents today for follow-up concerning his bilateral lower extremity ulcers. His a right medial ankle  ulcer and a left leg that has ulcers over a large proportion of the surface area between the ankle and knee. Unfortunately this does cause some discomfort for the patient although it doesn't seem to be as uncomfortable as it has been in the past. He is seen today for evaluation after a referral by [place] back to the clinic. Unfortunately has a lot of Slough noted on the surface of his wounds. He's been treated currently it sounds like with Dakin's soaked gauze and they have not been performing the Unna Boot wrap that was previously recommended. I'm not exactly sure what the reason for the change was. He does have less slough on the surface of the wound but again this is something that is often a constant issue for him. Again he's had these wounds for many years. I've known him for two years at least during that time when I was taking care of them in the facility he is Artie had the wounds for several years prior. READMISSION 02/25/2020 Josepha Pigg is a now 66 year old man. He has been in our clinic several times before most extensively in October 2016 through May 2017. At that time he had absolutely substantial bilateral lower extremity wounds secondary to chronic venous insufficiency and lymphedema. We ultimately referred him to [facility] he had a series of biopsies only showed suggestion of wound secondary to chronic venous insufficiency. He had a less extensive stay in our clinic in 2019 and then a single visit in February 2020. He is a resident at [facility] skilled facility. I think he has had intermittently had in facility wound care. He returns today. They are using silver alginate on the wounds although I am not sure what type of compression. Previously he has favored Unna boots. He is not felt to have an arterial issue. Past medical history; lymphedema and chronic venous insufficiency, diabetes mellitus, hypertension, congestive heart failure We did not do arterial studies today 04/27/2020 on  evaluation patient appears to be doing really about the same as when I have known him and seen him in the past. He does have wounds on the right and left legs at this point. Fortunately there is no signs of active infection at this time. 9/2; 1 month follow-up. This is a man with severe chronic venous insufficiency and lymphedema. When I first met him he had horrible circumferential bilateral lower extremity ulcers. We have been using silver alginate up until early last month when it was changed to [facility] and Unna boot's more or less says maintenance dressings. Since the last time he was here the facility where he resides [facility] called to report they could no longer do Unna boot so he is apparently only been receiving kerlix Coban the left leg is certainly a lot worse with almost circumferential large wounds especially  lateral but now medial and posterior as well. He has a standard same looking wound on the right medial lower leg. 10/1; absolutely no improvement here. In fact I do not think anything much is changed. The substantial area on his left lateral and left posterior calf is still open may be slightly larger. He has a more modest wound on the right medial calf. None of these look any different. He comes in with a kerlix Coban wrap this will simply not be adequate. He has too much edema in this leg He also is complaining a lot of pain in his left heel area. 11/8; potential wounds on the left lateral and posterior calf and a smaller oval wound in the right medial. We have been using Hydrofera Blue under 4-layer compression. He was using Unna boots still 2 months ago the facility called to say they can no longer place these so we have been using 4-layer compression. The surface area of the area on the left is so large there is very few alternatives to what we can use on this either silver alginate or Hydrofera Blue. He was complaining of pain on the left heel last time I asked for  an x-ray this was apparently done although we do not have the result. He is not complaining of pain today. 08/16/2020 on evaluation today patient is is seen for a early visit due to the fact that he apparently is having issues I was told when they called on Monday with a MRSA infection that they felt like we needed to see him for because his legs "looked horrible". With that being said based on what I see at this point it does not appear that the patient is actually having a terrible infection in fact compared to last time I saw him his legs do not appear to be doing too poorly at all at this time. Nonetheless he has been on doxycycline that is not going to be a good option for him based on the culture report that I have for review today which graded as a partial report with still several organisms pending as far as identification is concerned we did contact them but again they do not have the final report yet. Either way based on what we see it appears that doxycycline is one of the few medications that will not work for the Citrobacter. Obviously I think that a different medication would be a good option for him since there is a gram-negative organism pending I am likely can I suggest Cipro as the best of backup antibiotic to switch him to at this point. All this was discussed with the patient today. 12/20; not much change in the substantial wound on the left posterior calf and the small area on the right medial. He has a new area on the left anterior which looks like a wrap injury. Had some thoughts about doing a deep tissue culture here although we will put that off for next time. Using silver alginate as a primary dressing 2/10; substantial area on the left posterior calf. This measures smaller but still is a very large area. He has the area on the right anterior medial which also is a fairly large wound but much smaller than not on the left. His edema control is quite good. We have been using  silver alginate because of the size of the wounds. 4/7; substantial wound on the left posterior calf and a smaller area on the right medial lower leg. These are  very chronic wounds which we have classified is venous. Previously worked up at [facility] for 5 years ago at which time I had sent and will send him over for consideration of extensive skin graft I know they biopsied this many times but he never had plastic surgery. The wound area is much too large for standard size dressings we have been using silver alginate but we did give him a good trial last fall of Hydrofera Blue that did not make any difference either. If anything the area on the right medial lower leg over time has gotten a lot smaller as has the area on the left although it still quite substantial now. Readmission 10/08/2021 Mr. Deward Sebek is a 66 year old male with a past medical history of insulin-dependent type 2 diabetes with last hemoglobin A1c of 10.2, chronic venous insufficiency, lymphedema and chronic diastolic heart failure that presents to the clinic for bilateral lower extremity wounds. He has been followed in our clinic previously for the past 6 years for these issues. He was last seen in April 2022. He is inconsistent with his appointments in our clinic. These wounds have not healed over the past few years. He is currently using silver alginate to the wound beds and keeping these covered with Kerlix. He currently denies systemic signs of infection. He was recently hospitalized for septic shock secondary to bacteremia from likely chronic leg wounds And is currently on oral antibiotics to be completed this week. 1/30; patient presents for follow-up. He has no issues or complaints today. 2/6; patient presents for follow-up. He reports more discomfort with the increase in compression wraps. He currently denies systemic signs of infection and feels well overall. 2/13; patient presents for follow-up. He reports tolerating the  current leg/Coban wrap well. He denies systemic signs of infection and feels fine overall. 2/21; patient presents for follow-up. He has no issues or complaints today. He tolerated the kirlex/Coban wrap. He denies systemic signs of infection. 3/21; patient presents for follow-up. He has no issues or complaints today. He saw infectious disease, Dr. Megan Salon. Nothing further to do from an infectious disease point. Levaquin was stopped. He currently denies systemic signs of infection. 4/4; patient presents for follow-up. His facility was able to obtain the [facility] antibiotic and has started using this with dressing changes/wrap changes. Patient has no issues or complaints today. He denies signs of infection. 4/18; patient presents for follow-up. He has no issues or complaints today. He denies signs of infection. 5/9; patient presents for follow-up. He has been using [facility] antibiotic under Kerlix/Coban. He denies signs of infection today. 6/6; monthly follow-up. Patient is at [facility] skilled facility. He has been using [facility] probably silver alginate under kerlix Coban. We use Sorbact here. Wound condition has improved 7/11; patient presents for follow-up. We have been using [facility] with Sorbact under Kerlix/Coban. Patient has no issues or complaints today. Electronic Signature(s) Signed: 04/03/2022 11:30:51 AM By: [name] DO Entered By: [name] on 04/02/2022 16:00:26 -------------------------------------------------------------------------------- Physical Exam Details Patient Name: Date of Service: [name] 04/02/2022 1:00 PM Medical Record Number: [id number] Patient Account Number: [id number] Date of Birth/Sex: Treating RN: [birth year] (66 y.o. [name] Primary Care Provider: [name] Other Clinician: Referring Provider: Treating Provider/Extender: [name] in Treatment: 25 Constitutional respirations regular,  non-labored and within target range for patient.. Cardiovascular 2+ dorsalis pedis/posterior tibialis pulses. Psychiatric pleasant and cooperative. Notes Right lower extremity: T the anterior aspect there is an open wound with granulation  tissue. o Left lower extremity: T the posterior aspect there is a large open wound with granulation tissue. o Wound beds do not appear overly healthy. No surrounding signs of infection. Evidence of lymphedema skin changes bilaterally to the lower extremities Electronic Signature(s) Signed: 04/03/2022 11:30:51 AM By: [name] DO Entered By: [name] on 04/02/2022 16:02:41 -------------------------------------------------------------------------------- Physician Orders Details Patient Name: Date of Service: [name] 04/02/2022 1:00 PM Medical Record Number: [id number] Patient Account Number: [id number] Date of Birth/Sex: Treating RN: 07/31/1956 (66 y.o. [name] Primary Care Provider: [name] Other Clinician: Referring Provider: Treating Provider/Extender: [name] in Treatment: 25 Verbal / Phone Orders: No Diagnosis Coding Follow-up Appointments Return appointment in 1 month. - Tuesday Aug. 8th @ 1300 w/ Dr. Heber Startex w/ Allayne Butcher Room #9 ****EXTRA TIME 75 MINS. HOYET/HIGH ACUITY*** No appt.'s after 11:00 and after 3:00!!**** Other: - Pt. to bring in [place] to every [facility] appt. please!!:) Bathing/ Shower/ Hygiene May shower and wash wound with soap and water. - when changing dressing Edema Control - Lymphedema / SCD / Other Elevate legs to the level of the heart or above for 30 minutes daily and/or when sitting, a frequency of: - throughout the day Moisturize legs daily. - with dressing changes Additional Orders / Instructions Follow Nutritious Diet - Monitor/Control Blood Sugars-Continue using ProStat Other: - Go to emergency room for fever, chills, strong odor from wounds,  increased pain in wounds Wound Treatment Wound #25 - Lower Leg Wound Laterality: Right, Medial Cleanser: Soap and Water 3 x Per Week/30 Days Discharge Instructions: May shower and wash wound with dial antibacterial soap and water prior to dressing change. Cleanser: Wound Cleanser 3 x Per Week/30 Days Discharge Instructions: Cleanse the wound with wound cleanser prior to applying a clean dressing using gauze sponges, not tissue or cotton balls. Peri-Wound Care: Zinc Oxide Ointment 30g tube 3 x Per Week/30 Days Discharge Instructions: Apply Zinc Oxide to periwound with each dressing change Peri-Wound Care: Sween Lotion (Moisturizing lotion) 3 x Per Week/30 Days Discharge Instructions: Apply moisturizing lotion as directed Prim Dressing: Cutimed Sorbact Swab 3 x Per Week/30 Days ary Discharge Instructions: Apply to wound bed as instructed Prim Dressing: Keystone Antibiotic spray ary 3 x Per Week/30 Days Secondary Dressing: ABD Pad, 8x10 3 x Per Week/30 Days Discharge Instructions: Apply over primary dressing as directed. Compression Wrap: Kerlix Roll 4.5x3.1 (in/yd) 3 x Per Week/30 Days Discharge Instructions: Apply Kerlix and Coban compression as directed. Compression Wrap: Coban Self-Adherent Wrap 4x5 (in/yd) 3 x Per Week/30 Days Discharge Instructions: Apply over Kerlix as directed. Wound #27 - Lower Leg Wound Laterality: Left, Posterior Cleanser: Soap and Water 3 x Per Week/30 Days Discharge Instructions: May shower and wash wound with dial antibacterial soap and water prior to dressing change. Cleanser: Wound Cleanser 3 x Per Week/30 Days Discharge Instructions: Cleanse the wound with wound cleanser prior to applying a clean dressing using gauze sponges, not tissue or cotton balls. Peri-Wound Care: Zinc Oxide Ointment 30g tube 3 x Per Week/30 Days Discharge Instructions: Apply Zinc Oxide to periwound with each dressing change Peri-Wound Care: Sween Lotion (Moisturizing lotion) 3 x Per  Week/30 Days Discharge Instructions: Apply moisturizing lotion as directed Prim Dressing: Cutimed Sorbact Swab 3 x Per Week/30 Days ary Discharge Instructions: Apply to wound bed as instructed Prim Dressing: Keystone Antibiotic spray ary 3 x Per Week/30 Days Secondary Dressing: ABD Pad, 8x10 3 x Per Week/30 Days Discharge Instructions: Apply over primary dressing as directed.  Compression Wrap: Kerlix Roll 4.5x3.1 (in/yd) 3 x Per Week/30 Days Discharge Instructions: Apply Kerlix and Coban compression as directed. Compression Wrap: Coban Self-Adherent Wrap 4x5 (in/yd) 3 x Per Week/30 Days Discharge Instructions: Apply over Kerlix as directed. Electronic Signature(s) Signed: 04/03/2022 11:30:51 AM By: [name] DO Entered By: [name] on 04/02/2022 16:02:53 -------------------------------------------------------------------------------- Problem List Details Patient Name: Date of Service: [name] 04/02/2022 1:00 PM Medical Record Number: [id number] Patient Account Number: [id number] Date of Birth/Sex: Treating RN: 09/05/1956 (66 y.o. [name], [name] Primary Care Provider: [name] Other Clinician: Referring Provider: Treating Provider/Extender: [name] in Treatment: 25 Active Problems ICD-10 Encounter Code Description Active Date MDM Diagnosis I87.333 Chronic venous hypertension (idiopathic) with ulcer and inflammation of 10/08/2021 No Yes bilateral lower extremity 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ed1/16/2023 No Yes L97.912 Non-pressure chronic ulcer of unspecified part of right lower leg with fat layer 10/08/2021 No Yes exposed I89.0 Lymphedema, not elsewhere classified 10/08/2021 No Yes Inactive Problems Resolved Problems Electronic Signature(s) Signed: 04/03/2022 11:30:51 AM By: [name] DO Entered By: [name] on 04/02/2022  15:59:22 -------------------------------------------------------------------------------- Progress Note Details Patient Name: Date of Service: [name] 04/02/2022 1:00 PM Medical Record Number: [id number] Patient Account Number: [id number] Date of Birth/Sex: Treating RN: [birth year] (66 y.o. [name] Primary Care Provider: [name] Other Clinician: Referring Provider: Treating Provider/Extender: [name] in Treatment: 25 Subjective Chief Complaint Information obtained from Patient 10/08/2021; bilateral lower extremity wounds History of Present Illness (HPI) 07/07/15; this is a patient who is in hospital on 8/2 through 8/4. He had cellulitis and abscess of predominantly I think the left leg. He received IV antibiotics. Plain x-ray showed no osteomyelitis. An MRI of the left leg did not show osteomyelitis. Cultures showed no predominant organism. His hemoglobin A1c was 9.8. He has a history of venous stasis also peripheral vascular disease. He was discharged to [facility]. He really has extensive ulcerations on the left lateral leg including a major wound that communicates both posteriorly and superiorly w. When drainage coming out of 2 smaller areas. He has a smaller wound on the posterior medial left leg. He has more predominantly venous insufficiency wounds on predominantly the right medial leg above the ankle the right foot into sections. He has recently been put on Augmentin at the nursing home. Previous ABIs/arterial evaluation showed triphasic waves diffusely he does not have a major ischemic issue a left lower extremity venous duplex also exam showed no evidence of a DVT on 6/21 07/21/15; the patient arrives with really no major change. Culture I did last time was negative. He has not had a follow-up MRI I ordered. The wounds are macerated covered with a thick gelatinous surface slough. There is necrotic subcutaneous  tissue 08/04/15; the patient arrives with a considerable improvement in the majority of his wound area. The area on the left leg now has what looks to be a granulated base. Most of the wounds on the left leg required an aggressive surgical debridement to remove nonviable fibrinous eschar and subcutaneous tissue however after debridement most of this looks better. Although I had asked for repeat MRI of the left leg when he first came in here with grossly purulent material coming out of his wounds, it does not appear that this is been done and nor do I actually feel that strongly about it right now. He has severe surrounding  venous insufficiency and inflammation. I don't believe he has significant PAD 08/25/15. In general there is still a considerable wound area here but with still extensive service adherent slough. The patient will not allow mechanical debridement due to pain. He did not tolerate Medihoney therefore we are left with Santyl for now. She would appear that he has severe surrounding venous stasis. There is no evidence of the infection that may have had something to do with the pathogenesis of these wounds 09/29/15; patient still has substantial wound area on the left leg with a cluster of several wounds on the lateral leg confluently on the left leg posteriorly and then a substantial wound on the medial leg. On the right leg a substantial wound medially and a small area on the right lateral foot. All of these underwent a substantial surgical debridement with curettes which she tolerated better than he has in the past. This started as a complex cellulitis in the face of chronic venous insufficiency and inflammation. 10/13/15; substantial cluster of wounds on the lateral aspect of his left leg confluently around to the other side. Extensive surgical debridement to remove for redness surface slough nonviable subcutaneous tissue. This is not an improvement. Also substantial wound on the medial  right leg which is largely unchanged. The etiology of this was felt to be a complex cellulitis in the summer of 2016 in the face of chronic venous insufficiency and inflammation. The patient states that the wound on the right medial leg has been there for years off and on. 10/20/15; we are able to start [facility] to these extensive wound areas left greater than right on Tuesday after I spoke to the wound care nurse at the facility. He arrives here today for extensive surgical debridement for the wounds on the lateral aspect of his left leg posterior left leg, this is almost circumferential. He has a large wound on the medial aspect of his right leg although he finds this too painful for debridement 10/27/15; I continue to bring this patient back for frequent debridement/weekly debridement severe. We have been using Hydrofera Blue. Unfortunately this has not really had any improvement. The debridement surgery difficult and painful for the patient 11/23/15; this patient spent a complex hospitalization admitted with acute kidney injury, anemia, cellulitis of the lower extremity, he was felt to have sepsis pathophysiology although his blood cultures were negative. He had plain x-rays of both legs that were negative for osseous abnormalities. He was seen by infectious disease and placed on a workup for vasculitis that was negative including a biopsy o4 all were consistent with stasis dermatitis. Vital broad-spectrum antibiotics he continued to spike fevers. Urine and chest x-ray were negative Dopplers on admission ruled out a DVT All antibiotics were stopped on 2/17. . Since his return to [facility] skilled nursing facility I believe they have been using Xeroform 12/07/15 the patient arrives today with the area on his right medial leg actually looking quite stable. No debridement. The rest of his extensive wounds on the left lateral left posterior extending into the left medial leg all required extensive  debridement. I think this is probably going to need to and up in the hands of plastic surgery. We'll attempt to change him back to [facility]. Arrange consultation with plastic surgery at [facility] for this almost circumferential wound on the left side 12/21/15 right leg covered in surface slough. This was debridement. Left leg extensive wounds all carefully examined. This is almost circumferential on the left side especially  on the posterior calf. No debridement is necessary. 01/04/16 the patient has been to [facility] plastic surgery. Unfortunately it doesn't look like they had any of the prior workup on this patient. They're in the middle of vascular workup. It sounds as though they're applying Hydrofera Blue at the nursing home. 01/22/16; the patient has been back to see [facility] There apparently making plans to possibly do skin grafts over his large venous insufficiency ulcerations. The patient tells me that he goes to hematology in [facility] and variably uses the term platelets red cells and the description of his problem. 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 I do not see any major difference in the wound on the right medial leg and circumferentially across the left medial to left lateral leg. Did not attempt to debride these today. Readmission: 05/05/18 on evaluation today patient presents for reevaluation here in our clinic although I have previously seen him in the skilled nursing facility over the past year and a half when I was working in the skilled nursing facility realm instead of covering in the clinic. With that being said during that time we had initiated most recently dressings with Hydrofera Blue Dressing along with the [employer] which had done very well for him. Much better than the Kerlex Coban wraps. With that being said the patient had been doing fairly well and was making progress when I last saw him. Upon evaluation today  it appears that the wounds are actually a little bit worse than when I last saw him although definitely not dramatically so. There does not appear to be any evidence of infection which is good news. With that being said he has been tolerating the wraps without complication his left hip still gives him a lot of trouble which is his main issue as far as movement is concerned. Unbeknownst to me he has not had venous studies that I can find. He previously told me he had there were arterial studies noted back from 04/27/15 which showed that he had try phasic blood flow throughout and again his test appear to be completely normal as performed by Dr. Creig Hines. With that being said I cannot find where he had venous studies. The patient still states he thinks he did these definitely had no venous intervention at this point. Upon evaluation today it does not appear to me that the patient has any evidence of cellulitis. He does have some chronic venous insufficiency which I think has led to stasis dermatitis but again this is not appearing to be infected at this point. No fevers, chills, nausea, or vomiting noted at this time. READMISSION 06/30/2018 This is a now 66 year old man we have had in this clinic at multiple times in the past. Most recently he came in August and was seen by [facility]. It is not clear why he did not come back we asked him really did not get a straight answer. He is at [facility] skilled facility he is a man who has severe chronic venous insufficiency with lymphedema and has had severe wounds on his left greater than right leg. We had him here in 2016 and 17 ultimately referred him to [facility]. He had arterial studies and venous studies done at [facility] although I am not able to access these records I see they are actually done. He also had multiple biopsies that were negative for malignancy showing changes compatible with chronic inflammation. As I  understand things in [place] they are using Iodoflex and Unna boots. I am not really sure how they are getting enough Iodoflex for the large wound area especially on his left calf. Nevertheless the wounds look better than when I saw this in 2017. There were plans for him to have plastic surgery in 2018 and I think he was actually prepped for surgery however he was ultimately denied because the patient was an active smoker. The patient is not systemically unwell. The wounds are painful however given the size especially on the left this is not surprising. ABIs in this clinic were 0.78 on the left and 0.91 on the right 07/13/2018; this is a patient with bilateral severe venous ulcers with secondary lymphedema. The wounds are on the right medial calf and a substantial part of the left posterior calf and some involvement medially and laterally on the left. We changed him to silver alginate under Unna boot's last time. The wounds actually look fairly satisfactory today. 08/14/2018; this is a patient with severe bilateral venous stasis ulcers with secondary lymphedema left greater than right he is at [facility] using silver alginate under Unna boot's I do not think there is much change on this visit versus last time I saw him a month ago Readmission: 11/04/18 patient presents today for follow-up concerning his bilateral lower extremity ulcers. His a right medial ankle ulcer and a left leg that has ulcers over a large proportion of the surface area between the ankle and knee. Unfortunately this does cause some discomfort for the patient although it doesn't seem to be as uncomfortable as it has been in the past. He is seen today for evaluation after a referral by [place] back to the clinic. Unfortunately has a lot of Slough noted on the surface of his wounds. He's been treated currently it sounds like with Dakin's soaked gauze and they have not been performing the Unna Boot wrap that was previously recommended. I'm not exactly sure  what the reason for the change was. He does have less slough on the surface of the wound but again this is something that is often a constant issue for him. Again he's had these wounds for many years. I've known him for two years at least during that time when I was taking care of them in the facility he is Artie had the wounds for several years prior. READMISSION 02/25/2020 Josepha Pigg is a now 66 year old man. He has been in our clinic several times before most extensively in October 2016 through May 2017. At that time he had absolutely substantial bilateral lower extremity wounds secondary to chronic venous insufficiency and lymphedema. We ultimately referred him to [facility] he had a series of biopsies only showed suggestion of wound secondary to chronic venous insufficiency. He had a less extensive stay in our clinic in 2019 and then a single visit in February 2020. He is a resident at [facility] skilled facility. I think he has had intermittently had in facility wound care. He returns today. They are using silver alginate on the wounds although I am not sure what type of compression. Previously he has favored Unna boots. He is not felt to have an arterial issue. Past medical history; lymphedema and chronic venous insufficiency, diabetes mellitus, hypertension, congestive heart failure We did not do arterial studies today 04/27/2020 on evaluation patient appears to be doing really about the same as when I have known him and seen him in the past. He does have wounds  on the right and left legs at this point. Fortunately there is no signs of active infection at this time. 9/2; 1 month follow-up. This is a man with severe chronic venous insufficiency and lymphedema. When I first met him he had horrible circumferential bilateral lower extremity ulcers. We have been using silver alginate up until early last month when it was changed to [facility] and Unna boot's more or less says maintenance  dressings. Since the last time he was here the facility where he resides [facility] called to report they could no longer do Unna boot so he is apparently only been receiving kerlix Coban the left leg is certainly a lot worse with almost circumferential large wounds especially lateral but now medial and posterior as well. He has a standard same looking wound on the right medial lower leg. 10/1; absolutely no improvement here. In fact I do not think anything much is changed. The substantial area on his left lateral and left posterior calf is still open may be slightly larger. He has a more modest wound on the right medial calf. None of these look any different. He comes in with a kerlix Coban wrap this will simply not be adequate. He has too much edema in this leg He also is complaining a lot of pain in his left heel area. 11/8; potential wounds on the left lateral and posterior calf and a smaller oval wound in the right medial. We have been using Hydrofera Blue under 4-layer compression. He was using Unna boots still 2 months ago the facility called to say they can no longer place these so we have been using 4-layer compression. The surface area of the area on the left is so large there is very few alternatives to what we can use on this either silver alginate or Hydrofera Blue. He was complaining of pain on the left heel last time I asked for an x-ray this was apparently done although we do not have the result. He is not complaining of pain today. 08/16/2020 on evaluation today patient is is seen for a early visit due to the fact that he apparently is having issues I was told when they called on Monday with a MRSA infection that they felt like we needed to see him for because his legs "looked horrible". With that being said based on what I see at this point it does not appear that the patient is actually having a terrible infection in fact compared to last time I saw him his legs do not appear to  be doing too poorly at all at this time. Nonetheless he has been on doxycycline that is not going to be a good option for him based on the culture report that I have for review today which graded as a partial report with still several organisms pending as far as identification is concerned we did contact them but again they do not have the final report yet. Either way based on what we see it appears that doxycycline is one of the few medications that will not work for the Citrobacter. Obviously I think that a different medication would be a good option for him since there is a gram-negative organism pending I am likely can I suggest Cipro as the best of backup antibiotic to switch him to at this point. All this was discussed with the patient today. 12/20; not much change in the substantial wound on the left posterior calf and the small area on the right medial.  He has a new area on the left anterior which looks like a wrap injury. Had some thoughts about doing a deep tissue culture here although we will put that off for next time. Using silver alginate as a primary dressing 2/10; substantial area on the left posterior calf. This measures smaller but still is a very large area. He has the area on the right anterior medial which also is a fairly large wound but much smaller than not on the left. His edema control is quite good. We have been using silver alginate because of the size of the wounds. 4/7; substantial wound on the left posterior calf and a smaller area on the right medial lower leg. These are very chronic wounds which we have classified is venous. Previously worked up at [facility] for 5 years ago at which time I had sent and will send him over for consideration of extensive skin graft I know they biopsied this many times but he never had plastic surgery. The wound area is much too large for standard size dressings we have been using silver alginate but we did give him a good trial last fall of  Hydrofera Blue that did not make any difference either. If anything the area on the right medial lower leg over time has gotten a lot smaller as has the area on the left although it still quite substantial now. Readmission 10/08/2021 Mr. Evart Mcdonnell is a 66 year old male with a past medical history of insulin-dependent type 2 diabetes with last hemoglobin A1c of 10.2, chronic venous insufficiency, lymphedema and chronic diastolic heart failure that presents to the clinic for bilateral lower extremity wounds. He has been followed in our clinic previously for the past 6 years for these issues. He was last seen in April 2022. He is inconsistent with his appointments in our clinic. These wounds have not healed over the past few years. He is currently using silver alginate to the wound beds and keeping these covered with Kerlix. He currently denies systemic signs of infection. He was recently hospitalized for septic shock secondary to bacteremia from likely chronic leg wounds And is currently on oral antibiotics to be completed this week. 1/30; patient presents for follow-up. He has no issues or complaints today. 2/6; patient presents for follow-up. He reports more discomfort with the increase in compression wraps. He currently denies systemic signs of infection and feels well overall. 2/13; patient presents for follow-up. He reports tolerating the current leg/Coban wrap well. He denies systemic signs of infection and feels fine overall. 2/21; patient presents for follow-up. He has no issues or complaints today. He tolerated the kirlex/Coban wrap. He denies systemic signs of infection. 3/21; patient presents for follow-up. He has no issues or complaints today. He saw infectious disease, Dr. Megan Salon. Nothing further to do from an infectious disease point. Levaquin was stopped. He currently denies systemic signs of infection. 4/4; patient presents for follow-up. His facility was able to obtain the [facility]  antibiotic and has started using this with dressing changes/wrap changes. Patient has no issues or complaints today. He denies signs of infection. 4/18; patient presents for follow-up. He has no issues or complaints today. He denies signs of infection. 5/9; patient presents for follow-up. He has been using Keystone antibiotic under Kerlix/Coban. He denies signs of infection today. 6/6; monthly follow-up. Patient is at [facility] skilled facility. He has been using Keystone probably silver alginate under kerlix Coban. We use Sorbact here. Wound condition has improved 7/11; patient presents for follow-up.  We have been using Keystone with Sorbact under Kerlix/Coban. Patient has no issues or complaints today. Patient History Information obtained from Patient. Family History Diabetes - Mother,Father,Siblings, Hypertension - Mother,Father, No family history of Cancer, Heart Disease, Hereditary Spherocytosis, Kidney Disease, Lung Disease, Seizures, Stroke, Thyroid Problems, Tuberculosis. Social History Current every day smoker - 4 cig per day, Marital Status - Separated, Alcohol Use - Never, Drug Use - Prior History - cocaine, [place] quit 2005, Caffeine Use - Moderate. Medical History Eyes Denies history of Cataracts, Glaucoma, Optic Neuritis Ear/Nose/Mouth/Throat Denies history of Chronic sinus problems/congestion, Middle ear problems Hematologic/Lymphatic Patient has history of Anemia - iron deficiency Denies history of Hemophilia, Human Immunodeficiency Virus, Lymphedema, Sickle Cell Disease Respiratory Patient has history of Sleep Apnea Denies history of Aspiration, Asthma, Chronic Obstructive Pulmonary Disease (COPD), Pneumothorax, Tuberculosis Cardiovascular Patient has history of Congestive Heart Failure, Coronary Artery Disease, Hypertension, Peripheral Venous Disease Denies history of Angina, Arrhythmia, Deep Vein Thrombosis, Hypotension, Myocardial Infarction, Peripheral Arterial  Disease, Phlebitis, Vasculitis Gastrointestinal Denies history of Cirrhosis , Colitis, Crohnoos, Hepatitis A, Hepatitis B, Hepatitis C Endocrine Patient has history of Type II Diabetes - uncontrolled Denies history of Type I Diabetes Genitourinary Denies history of End Stage Renal Disease Immunological Denies history of Lupus Erythematosus, Raynaudoos, Scleroderma Integumentary (Skin) Denies history of History of Burn Musculoskeletal Patient has history of Osteoarthritis Denies history of Gout, Rheumatoid Arthritis, Osteomyelitis Neurologic Patient has history of Neuropathy Denies history of Dementia, Quadriplegia, Paraplegia, Seizure Disorder Oncologic Denies history of Received Chemotherapy, Received Radiation Psychiatric Denies history of Anorexia/bulimia, Confinement Anxiety Hospitalization/Surgery History - inguinal hernia repair with mesh. - right shoulder rotator cuff repair. - toe nail excision. - Sepsis 09/03/21-09/10/22. Medical A Surgical History Notes nd Gastrointestinal GERD Musculoskeletal degenerative righ thip Psychiatric depression Objective Constitutional respirations regular, non-labored and within target range for patient.. Vitals Time Taken: 1:13 PM, Height: 77 in, Weight: 258 lbs, BMI: 30.6, Temperature: 98.3 F, Pulse: 74 bpm, Respiratory Rate: 17 breaths/min, Blood Pressure: 131/74 mmHg, Capillary Blood Glucose: 181 mg/dl. Cardiovascular 2+ dorsalis pedis/posterior tibialis pulses. Psychiatric pleasant and cooperative. General Notes: Right lower extremity: T the anterior aspect there is an open wound with granulation tissue. Left lower extremity: T the posterior aspect there o o is a large open wound with granulation tissue. Wound beds do not appear overly healthy. No surrounding signs of infection. Evidence of lymphedema skin changes bilaterally to the lower extremities Integumentary (Hair, Skin) Wound #25 status is Open. Original cause of  wound was Gradually Appeared. The date acquired was: 09/07/2021. The wound has been in treatment 25 weeks. The wound is located on the Right,Medial Lower Leg. The wound measures 5.3cm length x 2.8cm width x 0.3cm depth; 11.655cm^2 area and 3.497cm^3 volume. There is Fat Layer (Subcutaneous Tissue) exposed. There is no tunneling or undermining noted. There is a large amount of serosanguineous drainage noted. The wound margin is distinct with the outline attached to the wound base. There is large (67-100%) red, pink granulation within the wound bed. There is no necrotic tissue within the wound bed. Wound #27 status is Open. Original cause of wound was Gradually Appeared. The date acquired was: 09/07/2021. The wound has been in treatment 25 weeks. The wound is located on the Left,Posterior Lower Leg. The wound measures 17.5cm length x 14cm width x 0.4cm depth; 192.423cm^2 area and 76.969cm^3 volume. There is Fat Layer (Subcutaneous Tissue) exposed. There is no tunneling or undermining noted. There is a medium amount of serosanguineous drainage noted. The wound margin is  distinct with the outline attached to the wound base. There is large (67-100%) red, friable granulation within the wound bed. There is no necrotic tissue within the wound bed. 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 No signs of surrounding soft tissue infection I recommended continue Keystone and Sorbact under Kerlix/Coban. He resides in a facility that changes the dressings weekly. Plan Follow-up Appointments: Return appointment in 1 month. - Tuesday Aug. 8th @ 1300 w/ Dr. Heber [place] w/ [facility] Room #9 ****EXTRA  TIME 75 MINS. HOYET/HIGH ACUITY*** No appt.'s after 11:00 and after 3:00!!**** Other: - Pt. to bring in [place] to every [facility] appt. please!!:) Bathing/ Shower/ Hygiene: May shower and wash wound with soap and water. - when changing dressing Edema Control - Lymphedema / SCD / Other: Elevate legs to the level of the heart or above for 30 minutes daily and/or when sitting, a frequency of: - throughout the day Moisturize legs daily. - with dressing changes Additional Orders / Instructions: Follow Nutritious Diet - Monitor/Control Blood Sugars-Continue using ProStat Other: - Go to emergency room for fever, chills, strong odor from wounds, increased pain in wounds WOUND #25: - Lower Leg Wound Laterality: Right, Medial Cleanser: Soap and Water 3 x Per Week/30 Days Discharge Instructions: May shower and wash wound with dial antibacterial soap and water prior to dressing change. Cleanser: Wound Cleanser 3 x Per Week/30 Days Discharge Instructions: Cleanse the wound with wound cleanser prior to applying a clean dressing using gauze sponges, not tissue or cotton balls. Peri-Wound Care: Zinc Oxide Ointment 30g tube 3 x Per Week/30 Days Discharge Instructions: Apply Zinc Oxide to periwound with each dressing change Peri-Wound Care: Sween Lotion (Moisturizing lotion) 3 x Per Week/30 Days Discharge Instructions: Apply moisturizing lotion as directed Prim Dressing: Cutimed Sorbact Swab 3 x Per Week/30 Days ary Discharge Instructions: Apply to wound bed as instructed Prim Dressing: Keystone Antibiotic spray 3 x Per Week/30 Days ary Secondary Dressing: ABD Pad, 8x10 3 x Per Week/30 Days Discharge Instructions: Apply over primary dressing as directed. Com pression Wrap: Kerlix Roll 4.5x3.1 (in/yd) 3 x Per Week/30 Days Discharge Instructions: Apply Kerlix and Coban compression as directed. Com pression Wrap: Coban Self-Adherent Wrap 4x5 (in/yd) 3 x Per Week/30 Days Discharge Instructions: Apply over Kerlix  as directed. WOUND #27: - Lower Leg Wound Laterality: Left, Posterior Cleanser: Soap and Water 3 x Per Week/30 Days Discharge Instructions: May shower and wash wound with dial antibacterial soap and water prior to dressing change. Cleanser: Wound Cleanser 3 x Per Week/30 Days Discharge Instructions: Cleanse the wound with wound cleanser prior to applying a clean dressing using gauze sponges, not tissue or cotton balls. Peri-Wound Care: Zinc Oxide Ointment 30g tube 3 x Per Week/30 Days Discharge Instructions: Apply Zinc Oxide to periwound with each dressing change Peri-Wound Care: Sween Lotion (Moisturizing lotion) 3 x Per Week/30 Days Discharge Instructions: Apply moisturizing lotion as directed Prim Dressing: Cutimed Sorbact Swab 3 x Per Week/30 Days ary Discharge Instructions: Apply to wound bed as instructed Prim Dressing: Keystone Antibiotic spray 3 x Per Week/30 Days ary Secondary Dressing: ABD Pad, 8x10 3 x Per Week/30 Days Discharge Instructions: Apply over primary dressing as directed. Com pression Wrap: Kerlix Roll 4.5x3.1 (in/yd) 3 x Per Week/30 Days  Discharge Instructions: Apply Kerlix and Coban compression as directed. Com pression Wrap: Coban Self-Adherent Wrap 4x5 (in/yd) 3 x Per Week/30 Days Discharge Instructions: Apply over Kerlix as directed. 1. Keystone with Sorbact under Kerlix/Coban to lower extremities bilaterally 2. Follow-up in 1 month Electronic Signature(s) Signed: 04/03/2022 11:30:51 AM By: [name] DO Entered By: [name] on 04/02/2022 16:06:50 -------------------------------------------------------------------------------- HxROS Details Patient Name: Date of Service: [name] 04/02/2022 1:00 PM Medical Record Number: [id number] Patient Account Number: [id number] Date of Birth/Sex: Treating RN: 1956/08/21 (66 y.o. [name] Primary Care Provider: [name] Other Clinician: Referring Provider: Treating Provider/Extender:  [name] in Treatment: 25 Information Obtained From Patient Eyes Medical History: Negative for: Cataracts; Glaucoma; Optic Neuritis Ear/Nose/Mouth/Throat Medical History: Negative for: Chronic sinus problems/congestion; Middle ear problems Hematologic/Lymphatic Medical History: Positive for: Anemia - iron deficiency Negative for: Hemophilia; Human Immunodeficiency Virus; Lymphedema; Sickle Cell Disease Respiratory Medical History: Positive for: Sleep Apnea Negative for: Aspiration; Asthma; Chronic Obstructive Pulmonary Disease (COPD); Pneumothorax; Tuberculosis Cardiovascular Medical History: Positive for: Congestive Heart Failure; Coronary Artery Disease; Hypertension; Peripheral Venous Disease Negative for: Angina; Arrhythmia; Deep Vein Thrombosis; Hypotension; Myocardial Infarction; Peripheral Arterial Disease; Phlebitis; Vasculitis Gastrointestinal Medical History: Negative for: Cirrhosis ; Colitis; Crohns; Hepatitis A; Hepatitis B; Hepatitis C Past Medical History Notes: GERD Endocrine Medical History: Positive for: Type II Diabetes - uncontrolled Negative for: Type I Diabetes Time with diabetes: 1999 Treated with: Insulin Blood sugar tested every day: Yes T ested : 3x per day Blood sugar testing results: Breakfast: 81-200; Lunch: 180-210; Dinner: 200 Genitourinary Medical History: Negative for: End Stage Renal Disease Immunological Medical History: Negative for: Lupus Erythematosus; Raynauds; Scleroderma Integumentary (Skin) Medical History: Negative for: History of Burn Musculoskeletal Medical History: Positive for: Osteoarthritis Negative for: Gout; Rheumatoid Arthritis; Osteomyelitis Past Medical History Notes: degenerative righ thip Neurologic Medical History: Positive for: Neuropathy Negative for: Dementia; Quadriplegia; Paraplegia; Seizure Disorder Oncologic Medical History: Negative for: Received Chemotherapy;  Received Radiation Psychiatric Medical History: Negative for: Anorexia/bulimia; Confinement Anxiety Past Medical History Notes: depression Immunizations Pneumococcal Vaccine: Received Pneumococcal Vaccination: Yes Received Pneumococcal Vaccination On or After 60th Birthday: No Implantable Devices None Hospitalization / Surgery History Type of Hospitalization/Surgery inguinal hernia repair with mesh right shoulder rotator cuff repair toe nail excision Sepsis 09/03/21-09/10/22 Family and Social History Cancer: No; Diabetes: Yes - Mother,Father,Siblings; Heart Disease: No; Hereditary Spherocytosis: No; Hypertension: Yes - Mother,Father; Kidney Disease: No; Lung Disease: No; Seizures: No; Stroke: No; Thyroid Problems: No; Tuberculosis: No; Current every day smoker - 4 cig per day; Marital Status - Separated; Alcohol Use: Never; Drug Use: Prior History - cocaine, [place] quit 2005; Caffeine Use: Moderate; Financial Concerns: No; Food, Clothing or Shelter Needs: No; Support System Lacking: No; Transportation Concerns: No Electronic Signature(s) Signed: 04/02/2022 4:44:26 PM By: Rhae Hammock RN Signed: 04/03/2022 11:30:51 AM By: [name] DO Entered By: [name] on 04/02/2022 16:00:36 -------------------------------------------------------------------------------- SuperBill Details Patient Name: Date of Service: [name] 04/02/2022 Medical Record Number: [id number] Patient Account Number: [id number] Date of Birth/Sex: Treating RN: [birth year] (66 y.o. [name], [name] Primary Care Provider: [name] Other Clinician: Referring Provider: Treating Provider/Extender: [name] in Treatment: 25 Diagnosis Coding ICD-10 Codes Code Description [phone] Chronic venous hypertension (idiopathic) with ulcer and inflammation of bilateral lower extremity L97.822 Non-pressure chronic ulcer of other part of left lower leg with fat layer  exposed L97.912 Non-pressure chronic ulcer of unspecified part of right lower leg with fat layer exposed I89.0 Lymphedema,  not elsewhere classified Facility Procedures CPT4 Code: [id number] Description: Andrew VISIT-LEV 4 EST PT Modifier: Quantity: 1 Physician Procedures : CPT4 Code Description Modifier [id number] - WC PHYS LEVEL 3 - EST PT ICD-10 Diagnosis Description I87.333 Chronic venous hypertension (idiopathic) with ulcer and inflammation of bilateral lower extremity L97.822 Non-pressure chronic ulcer of other  part of left lower leg with fat layer exposed L97.912 Non-pressure chronic ulcer of unspecified part of right lower leg with fat layer exposed I89.0 Lymphedema, not elsewhere classified Quantity: 1 Electronic Signature(s) Signed: 04/03/2022 11:30:51 AM By: [name] DO Entered By: [name] on 04/02/2022 16:06:59

## 2022-04-30 ENCOUNTER — Encounter (HOSPITAL_BASED_OUTPATIENT_CLINIC_OR_DEPARTMENT_OTHER): Payer: Medicare Other | Attending: Internal Medicine | Admitting: Internal Medicine

## 2022-04-30 DIAGNOSIS — L97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ed: Secondary | ICD-10-CM | POA: Insufficient documentation

## 2022-04-30 DIAGNOSIS — E1151 Type 2 diabetes mellitus with diabetic peripheral angiopathy without gangrene: Secondary | ICD-10-CM | POA: Diagnosis not present

## 2022-04-30 DIAGNOSIS — I5032 Chronic diastolic (congestive) heart failure: Secondary | ICD-10-CM | POA: Insufficient documentation

## 2022-04-30 DIAGNOSIS — E11621 Type 2 diabetes mellitus with foot ulcer: Secondary | ICD-10-CM | POA: Insufficient documentation

## 2022-04-30 DIAGNOSIS — G4730 Sleep apnea, unspecified: Secondary | ICD-10-CM | POA: Diagnosis not present

## 2022-04-30 DIAGNOSIS — L97912 Non-pressure chronic ulcer of unspecified part of right lower leg with fat layer exposed: Secondary | ICD-10-CM | POA: Diagnosis not present

## 2022-04-30 DIAGNOSIS — I890 Lymphedema, not elsewhere classified: Secondary | ICD-10-CM | POA: Diagnosis not present

## 2022-04-30 DIAGNOSIS — I110 Hypertensive heart disease with heart failure: Secondary | ICD-10-CM | POA: Diagnosis not present

## 2022-04-30 DIAGNOSIS — I87333 Chronic venous hypertension (idiopathic) with ulcer and inflammation of bilateral lower extremity: Secondary | ICD-10-CM | POA: Insufficient documentation

## 2022-04-30 DIAGNOSIS — F17210 Nicotine dependence, cigarettes, uncomplicated: Secondary | ICD-10-CM | POA: Insufficient documentation

## 2022-04-30 DIAGNOSIS — E1165 Type 2 diabetes mellitus with hyperglycemia: Secondary | ICD-10-CM | POA: Insufficient documentation

## 2022-04-30 NOTE — Progress Notes (Signed)
Jurich, Shloimy ([id number]) Visit Report for 04/30/2022 Chief Complaint Document Details [name], [name] 04/30/2022 1:00 PM Medical Record Number: [id number] Patient Account Number: [id number] Date of Birth/Sex: Treating RN: 02-15-1956 (66 y.o. [name], Lauren Primary Care Provider: [name] Other Clinician: Referring Provider: Treating Provider/Extender: [name] in Treatment: 29 Information Obtained from: Patient Chief Complaint 10/08/2021; bilateral lower extremity wounds Electronic Signature(s) Signed: 04/30/2022 1:52:58 PM By: [name] DO Entered By: [name] on 04/30/2022 13:47:06 -------------------------------------------------------------------------------- Debridement Details [name], [name] 04/30/2022 1:00 PM Medical Record Number: [id number] Patient Account Number: [id number] Date of Birth/Sex: Treating RN: [birth year] (66 y.o. [name], Lauren Primary Care Provider: [name] Other Clinician: Referring Provider: Treating Provider/Extender: [name] in Treatment: 29 Debridement Performed for Assessment: Wound #29 Right,Lateral Lower Leg Performed By: Physician [name], DO Debridement Type: Debridement Severity of Tissue Pre Debridement: Fat layer exposed Level of Consciousness (Pre-procedure): Awake and Alert Pre-procedure Verification/Time Out Yes - 13:35 Taken: Start Time: 13:35 Pain Control: Lidocaine T Area Debrided (L x W): otal 1.1 (cm) x 0.7 (cm) = 0.77 (cm) Tissue and other material debrided: Viable, Non-Viable, Slough, Subcutaneous, Slough Level: Skin/Subcutaneous Tissue Debridement Description: Excisional Instrument: Curette Bleeding: Minimum Hemostasis Achieved: Pressure End Time: 13:35 Procedural Pain: 0 Post Procedural Pain: 0 Response to Treatment: Procedure was tolerated well Level of  Consciousness (Post- Awake and Alert procedure): Post Debridement Measurements of Total Wound Length: (cm) 1.1 Width: (cm) 0.7 Depth: (cm) 0.2 Volume: (cm) 0.121 Character of Wound/Ulcer Post Debridement: Improved Severity of Tissue Post Debridement: Fat layer exposed Post Procedure Diagnosis Same as Pre-procedure Electronic Signature(s) Signed: 04/30/2022 1:52:58 PM By: [name] DO Signed: 04/30/2022 4:31:06 PM By: Rhae Hammock RN Entered By: Rhae Hammock on 04/30/2022 13:37:30 -------------------------------------------------------------------------------- Debridement Details [name], [name] 04/30/2022 1:00 PM Medical Record Number: [id number] Patient Account Number: [id number] Date of Birth/Sex: Treating RN: 14-Sep-1956 (66 y.o. [name], Lauren Primary Care Provider: [name] Other Clinician: Referring Provider: Treating Provider/Extender: [name] in Treatment: 29 Debridement Performed for Assessment: Wound #25 Right,Medial Lower Leg Performed By: Physician [name], DO Debridement Type: Debridement Severity of Tissue Pre Debridement: Fat layer exposed Level of Consciousness (Pre-procedure): Awake and Alert Pre-procedure Verification/Time Out Yes - 13:35 Taken: Start Time: 13:35 Pain Control: Lidocaine T Area Debrided (L x W): otal 5.3 (cm) x 2.5 (cm) = 13.25 (cm) Tissue and other material debrided: Viable, Non-Viable, Slough, Subcutaneous, Slough Level: Skin/Subcutaneous Tissue Debridement Description: Excisional Instrument: Curette Bleeding: Minimum Hemostasis Achieved: Pressure End Time: 13:35 Procedural Pain: 0 Post Procedural Pain: 0 Response to Treatment: Procedure was tolerated well Level of Consciousness (Post- Awake and Alert procedure): Post Debridement Measurements of Total Wound Length: (cm) 5.3 Width: (cm) 2.5 Depth: (cm) 0.2 Volume: (cm) 2.081 Character  of Wound/Ulcer Post Debridement: Improved Severity of Tissue Post Debridement: Fat layer exposed Post Procedure Diagnosis Same as Pre-procedure Electronic Signature(s) Signed: 04/30/2022 1:52:58 PM By: [name] DO Signed: 04/30/2022 4:31:06 PM By: Rhae Hammock RN Entered By: Rhae Hammock on 04/30/2022 13:38:10 -------------------------------------------------------------------------------- Debridement Details [name], [name] 04/30/2022 1:00 PM Medical Record Number: [id number] Patient Account Number: [id number] Date of Birth/Sex: Treating RN: May 11, 1956 (66 y.o. [name] Primary Care Provider: [name] Other Clinician: Referring Provider: Treating Provider/Extender: [name] in Treatment: 29 Debridement Performed for Assessment: Wound #27  Left,Posterior Lower Leg Performed By: Physician [name], DO Debridement Type: Debridement Severity of Tissue Pre Debridement: Fat layer exposed Level of Consciousness (Pre-procedure): Awake and Alert Pre-procedure Verification/Time Out Yes - 13:35 Taken: Start Time: 13:35 Pain Control: Lidocaine T Area Debrided (L x W): otal 12 (cm) x 14 (cm) = 168 (cm) Tissue and other material debrided: Viable, Non-Viable, Slough, Subcutaneous, Slough Level: Skin/Subcutaneous Tissue Debridement Description: Excisional Instrument: Curette Bleeding: Minimum Hemostasis Achieved: Pressure End Time: 13:35 Procedural Pain: 0 Post Procedural Pain: 0 Response to Treatment: Procedure was tolerated well Level of Consciousness (Post- Awake and Alert procedure): Post Debridement Measurements of Total Wound Length: (cm) 12 Width: (cm) 14 Depth: (cm) 0.3 Volume: (cm) 39.584 Character of Wound/Ulcer Post Debridement: Improved Severity of Tissue Post Debridement: Fat layer exposed Post Procedure Diagnosis Same as Pre-procedure Electronic Signature(s) Signed:  04/30/2022 1:52:58 PM By: [name] DO Signed: 04/30/2022 4:31:06 PM By: Rhae Hammock RN Entered By: Rhae Hammock on 04/30/2022 13:39:00 -------------------------------------------------------------------------------- HPI Details [name], [name] 04/30/2022 1:00 PM Medical Record Number: [id number] Patient Account Number: [id number] Date of Birth/Sex: Treating RN: 08/19/1956 (66 y.o. [name] Primary Care Provider: [name] Other Clinician: Referring Provider: Treating Provider/Extender: [name] in Treatment: 29 History of Present Illness HPI Description: 07/07/15; this is a patient who is in hospital on 8/2 through 8/4. He had cellulitis and abscess of predominantly I think the left leg. He received IV antibiotics. Plain x-ray showed no osteomyelitis. An MRI of the left leg did not show osteomyelitis. Cultures showed no predominant organism. His hemoglobin A1c was 9.8. He has a history of venous stasis also peripheral vascular disease. He was discharged to [place] home. He really has extensive ulcerations on the left lateral leg including a major wound that communicates both posteriorly and superiorly w. When drainage coming out of 2 smaller areas. He has a smaller wound on the posterior medial left leg. He has more predominantly venous insufficiency wounds on predominantly the right medial leg above the ankle the right foot into sections. He has recently been put on Augmentin at the nursing home. Previous ABIs/arterial evaluation showed triphasic waves diffusely he does not have a major ischemic issue a left lower extremity venous duplex also exam showed no evidence of a DVT on 6/21 07/21/15; the patient arrives with really no major change. Culture I did last time was negative. He has not had a follow-up MRI I ordered. The wounds are macerated covered with a thick gelatinous surface slough.  There is necrotic subcutaneous tissue 08/04/15; the patient arrives with a considerable improvement in the majority of his wound area. The area on the left leg now has what looks to be a granulated base. Most of the wounds on the left leg required an aggressive surgical debridement to remove nonviable fibrinous eschar and subcutaneous tissue however after debridement most of this looks better. Although I had asked for repeat MRI of the left leg when he first came in here with grossly purulent material coming out of his wounds, it does not appear that this is been done and nor do I actually feel that strongly about it right now. He has severe surrounding venous insufficiency and inflammation. I don't believe he has significant PAD 08/25/15. In general there is still a considerable wound area here but with still extensive service adherent slough. The patient will not allow mechanical debridement due to pain. He did not tolerate Medihoney therefore we  are left with Santyl for now. She would appear that he has severe surrounding venous stasis. There is no evidence of the infection that may have had something to do with the pathogenesis of these wounds 09/29/15; patient still has substantial wound area on the left leg with a cluster of several wounds on the lateral leg confluently on the left leg posteriorly and then a substantial wound on the medial leg. On the right leg a substantial wound medially and a small area on the right lateral foot. All of these underwent a substantial surgical debridement with curettes which she tolerated better than he has in the past. This started as a complex cellulitis in the face of chronic venous insufficiency and inflammation. 10/13/15; substantial cluster of wounds on the lateral aspect of his left leg confluently around to the other side. Extensive surgical debridement to remove for redness surface slough nonviable subcutaneous tissue. This is not an improvement. Also  substantial wound on the medial right leg which is largely unchanged. The etiology of this was felt to be a complex cellulitis in the summer of 2016 in the face of chronic venous insufficiency and inflammation. The patient states that the wound on the right medial leg has been there for years off and on. 10/20/15; we are able to start [facility] to these extensive wound areas left greater than right on Tuesday after I spoke to the wound care nurse at the facility. He arrives here today for extensive surgical debridement for the wounds on the lateral aspect of his left leg posterior left leg, this is almost circumferential. He has a large wound on the medial aspect of his right leg although he finds this too painful for debridement 10/27/15; I continue to bring this patient back for frequent debridement/weekly debridement severe. We have been using Hydrofera Blue. Unfortunately this has not really had any improvement. The debridement surgery difficult and painful for the patient 11/23/15; this patient spent a complex hospitalization admitted with acute kidney injury, anemia, cellulitis of the lower extremity, he was felt to have sepsis pathophysiology although his blood cultures were negative. He had plain x-rays of both legs that were negative for osseous abnormalities. He was seen by infectious disease and placed on a workup for vasculitis that was negative including a biopsy 4 all were consistent with stasis dermatitis. Vital broad- spectrum antibiotics he continued to spike fevers. Urine and chest x-ray were negative Dopplers on admission ruled out a DVT All antibiotics were stopped on . 2/17. Since his return to [facility] skilled nursing facility I believe they have been using Xeroform 12/07/15 the patient arrives today with the area on his right medial leg actually looking quite stable. No debridement. The rest of his extensive wounds on the left lateral left posterior extending into the left  medial leg all required extensive debridement. I think this is probably going to need to and up in the hands of plastic surgery. We'll attempt to change him back to [employer]. Arrange consultation with plastic surgery at [facility] for this almost circumferential wound on the left side 12/21/15 right leg covered in surface slough. This was debridement. Left leg extensive wounds all carefully examined. This is almost circumferential on the left side especially on the posterior calf. No debridement is necessary. 01/04/16 the patient has been to [facility] plastic surgery. Unfortunately it doesn't look like they had any of the prior workup on this patient. They're in the middle of vascular workup. It sounds as though they're  applying Hydrofera Blue at the nursing home. 01/22/16; the patient has been back to see [facility]. There apparently making plans to possibly do skin grafts over his large venous insufficiency ulcerations. The patient tells me that he goes to hematology in [facility] and variably uses the term platelets red cells and the description of his problem. 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 I do not see any major difference in the wound on the right medial leg and circumferentially across the left medial to left lateral leg. Did not attempt to debride these today. Readmission: 05/05/18 on evaluation today patient presents for reevaluation here in our clinic although I have previously seen him in the skilled nursing facility over the past year and a half when I was working in the skilled nursing facility realm instead of covering in the clinic. With that being said during that time we had initiated most recently dressings with Hydrofera Blue Dressing along with the [employer] which had done very well for him. Much better than the Kerlex Coban wraps. With that being said the patient had been doing fairly well and was making progress when I  last saw him. Upon evaluation today it appears that the wounds are actually a little bit worse than when I last saw him although definitely not dramatically so. There does not appear to be any evidence of infection which is good news. With that being said he has been tolerating the wraps without complication his left hip still gives him a lot of trouble which is his main issue as far as movement is concerned. Unbeknownst to me he has not had venous studies that I can find. He previously told me he had there were arterial studies noted back from 04/27/15 which showed that he had try phasic blood flow throughout and again his test appear to be completely normal as performed by Dr. Creig Hines. With that being said I cannot find where he had venous studies. The patient still states he thinks he did these definitely had no venous intervention at this point. Upon evaluation today it does not appear to me that the patient has any evidence of cellulitis. He does have some chronic venous insufficiency which I think has led to stasis dermatitis but again this is not appearing to be infected at this point. No fevers, chills, nausea, or vomiting noted at this time. READMISSION 06/30/2018 This is a now 66 year old man we have had in this clinic at multiple times in the past. Most recently he came in August and was seen by [facility] stone. It is not clear why he did not come back we asked him really did not get a straight answer. He is at [facility] skilled facility he is a man who has severe chronic venous insufficiency with lymphedema and has had severe wounds on his left greater than right leg. We had him here in 2016 and 17 ultimately referred him to [facility]. He had arterial studies and venous studies done at [facility] although I am not able to access these records I see they are actually done. He also had multiple biopsies that were negative for malignancy showing changes compatible with chronic inflammation.  As I understand things in [place] they are using Iodoflex and Unna boots. I am not really sure how they are getting enough Iodoflex for the large wound area especially on his left calf. Nevertheless the wounds look better than when I saw this in  2017. There were plans for him to have plastic surgery in 2018 and I think he was actually prepped for surgery however he was ultimately denied because the patient was an active smoker. The patient is not systemically unwell. The wounds are painful however given the size especially on the left this is not surprising. ABIs in this clinic were 0.78 on the left and 0.91 on the right 07/13/2018; this is a patient with bilateral severe venous ulcers with secondary lymphedema. The wounds are on the right medial calf and a substantial part of the left posterior calf and some involvement medially and laterally on the left. We changed him to silver alginate under Unna boot's last time. The wounds actually look fairly satisfactory today. 08/14/2018; this is a patient with severe bilateral venous stasis ulcers with secondary lymphedema left greater than right he is at [facility] using silver alginate under Unna boot's I do not think there is much change on this visit versus last time I saw him a month ago Readmission: 11/04/18 patient presents today for follow-up concerning his bilateral lower extremity ulcers. His a right medial ankle ulcer and a left leg that has ulcers over a large proportion of the surface area between the ankle and knee. Unfortunately this does cause some discomfort for the patient although it doesn't seem to be as uncomfortable as it has been in the past. He is seen today for evaluation after a referral by [place] back to the clinic. Unfortunately has a lot of Slough noted on the surface of his wounds. He's been treated currently it sounds like with Dakin's soaked gauze and they have not been performing the Unna Boot wrap that was previously  recommended. I'm not exactly sure what the reason for the change was. He does have less slough on the surface of the wound but again this is something that is often a constant issue for him. Again he's had these wounds for many years. I've known him for two years at least during that time when I was taking care of them in the facility he is Artie had the wounds for several years prior. READMISSION 02/25/2020 Josepha Pigg is a now 66 year old man. He has been in our clinic several times before most extensively in October 2016 through May 2017. At that time he had absolutely substantial bilateral lower extremity wounds secondary to chronic venous insufficiency and lymphedema. We ultimately referred him to [facility] he had a series of biopsies only showed suggestion of wound secondary to chronic venous insufficiency. He had a less extensive stay in our clinic in 2019 and then a single visit in February 2020. He is a resident at [facility] skilled facility. I think he has had intermittently had in facility wound care. He returns today. They are using silver alginate on the wounds although I am not sure what type of compression. Previously he has favored Unna boots. He is not felt to have an arterial issue. Past medical history; lymphedema and chronic venous insufficiency, diabetes mellitus, hypertension, congestive heart failure We did not do arterial studies today 04/27/2020 on evaluation patient appears to be doing really about the same as when I have known him and seen him in the past. He does have wounds on the right and left legs at this point. Fortunately there is no signs of active infection at this time. 9/2; 1 month follow-up. This is a man with severe chronic venous insufficiency and lymphedema. When I first met him he had horrible circumferential bilateral  lower extremity ulcers. We have been using silver alginate up until early last month when it was changed to [facility] and Unna boot's more  or less says maintenance dressings. Since the last time he was here the facility where he resides [facility] called to report they could no longer do Unna boot so he is apparently only been receiving kerlix Coban the left leg is certainly a lot worse with almost circumferential large wounds especially lateral but now medial and posterior as well. He has a standard same looking wound on the right medial lower leg. 10/1; absolutely no improvement here. In fact I do not think anything much is changed. The substantial area on his left lateral and left posterior calf is still open may be slightly larger. He has a more modest wound on the right medial calf. None of these look any different. He comes in with a kerlix Coban wrap this will simply not be adequate. He has too much edema in this leg He also is complaining a lot of pain in his left heel area. 11/8; potential wounds on the left lateral and posterior calf and a smaller oval wound in the right medial. We have been using Hydrofera Blue under 4-layer compression. He was using Unna boots still 2 months ago the facility called to say they can no longer place these so we have been using 4-layer compression. The surface area of the area on the left is so large there is very few alternatives to what we can use on this either silver alginate or Hydrofera Blue. He was complaining of pain on the left heel last time I asked for an x-ray this was apparently done although we do not have the result. He is not complaining of pain today. 08/16/2020 on evaluation today patient is is seen for a early visit due to the fact that he apparently is having issues I was told when they called on Monday with a MRSA infection that they felt like we needed to see him for because his legs "looked horrible". With that being said based on what I see at this point it does not appear that the patient is actually having a terrible infection in fact compared to last time I saw him  his legs do not appear to be doing too poorly at all at this time. Nonetheless he has been on doxycycline that is not going to be a good option for him based on the culture report that I have for review today which graded as a partial report with still several organisms pending as far as identification is concerned we did contact them but again they do not have the final report yet. Either way based on what we see it appears that doxycycline is one of the few medications that will not work for the Citrobacter. Obviously I think that a different medication would be a good option for him since there is a gram-negative organism pending I am likely can I suggest Cipro as the best of backup antibiotic to switch him to at this point. All this was discussed with the patient today. 12/20; not much change in the substantial wound on the left posterior calf and the small area on the right medial. He has a new area on the left anterior which looks like a wrap injury. Had some thoughts about doing a deep tissue culture here although we will put that off for next time. Using silver alginate as a primary dressing 2/10; substantial area on  the left posterior calf. This measures smaller but still is a very large area. He has the area on the right anterior medial which also is a fairly large wound but much smaller than not on the left. His edema control is quite good. We have been using silver alginate because of the size of the wounds. 4/7; substantial wound on the left posterior calf and a smaller area on the right medial lower leg. These are very chronic wounds which we have classified is venous. Previously worked up at [facility] for 5 years ago at which time I had sent and will send him over for consideration of extensive skin graft I know they biopsied this many times but he never had plastic surgery. The wound area is much too large for standard size dressings we have been using silver alginate but we did give him  a good trial last fall of Hydrofera Blue that did not make any difference either. If anything the area on the right medial lower leg over time has gotten a lot smaller as has the area on the left although it still quite substantial now. Readmission 10/08/2021 Mr. Justis Closser is a 66 year old male with a past medical history of insulin-dependent type 2 diabetes with last hemoglobin A1c of 10.2, chronic venous insufficiency, lymphedema and chronic diastolic heart failure that presents to the clinic for bilateral lower extremity wounds. He has been followed in our clinic previously for the past 6 years for these issues. He was last seen in April 2022. He is inconsistent with his appointments in our clinic. These wounds have not healed over the past few years. He is currently using silver alginate to the wound beds and keeping these covered with Kerlix. He currently denies systemic signs of infection. He was recently hospitalized for septic shock secondary to bacteremia from likely chronic leg wounds And is currently on oral antibiotics to be completed this week. 1/30; patient presents for follow-up. He has no issues or complaints today. 2/6; patient presents for follow-up. He reports more discomfort with the increase in compression wraps. He currently denies systemic signs of infection and feels well overall. 2/13; patient presents for follow-up. He reports tolerating the current leg/Coban wrap well. He denies systemic signs of infection and feels fine overall. 2/21; patient presents for follow-up. He has no issues or complaints today. He tolerated the kirlex/Coban wrap. He denies systemic signs of infection. 3/21; patient presents for follow-up. He has no issues or complaints today. He saw infectious disease, Dr. Megan Salon. Nothing further to do from an infectious disease point. Levaquin was stopped. He currently denies systemic signs of infection. 4/4; patient presents for follow-up. His facility was  able to obtain the [facility] antibiotic and has started using this with dressing changes/wrap changes. Patient has no issues or complaints today. He denies signs of infection. 4/18; patient presents for follow-up. He has no issues or complaints today. He denies signs of infection. 5/9; patient presents for follow-up. He has been using Keystone antibiotic under Kerlix/Coban. He denies signs of infection today. 6/6; monthly follow-up. Patient is at [facility] skilled facility. He has been using Keystone probably silver alginate under kerlix Coban. We use Sorbact here. Wound condition has improved 7/11; patient presents for follow-up. We have been using Keystone with Sorbact under Kerlix/Coban. Patient has no issues or complaints today. 8/8; patient presents for follow-up. He is still using Keystone and Sorbact under Kerlix/Coban to his lower extremities bilaterally. He has no issues or complaints today. He denies signs  of infection. Electronic Signature(s) Signed: 04/30/2022 1:52:58 PM By: [name] DO Entered By: [name] on 04/30/2022 13:47:38 -------------------------------------------------------------------------------- Physical Exam Details [name], [name] 04/30/2022 1:00 PM Medical Record Number: [id number] Patient Account Number: [id number] Date of Birth/Sex: Treating RN: Aug 24, 1956 (66 y.o. [name] Primary Care Provider: [name] Other Clinician: Referring Provider: Treating Provider/Extender: [name] in Treatment: 29 Constitutional respirations regular, non-labored and within target range for patient.Marland Kitchen Psychiatric pleasant and cooperative. Notes Bilateral lower extremity wounds with granulation tissue and nonviable tissue, left > right. Overall wound beds do not appear healthy. No signs of surrounding infection. Evidence of lymphedema skin changes bilaterally to the lower  extremities. Electronic Signature(s) Signed: 04/30/2022 1:52:58 PM By: [name] DO Entered By: [name] on 04/30/2022 13:49:04 -------------------------------------------------------------------------------- Physician Orders Details [name], [name] 04/30/2022 1:00 PM Medical Record Number: [id number] Patient Account Number: [id number] Date of Birth/Sex: Treating RN: [birth year] (66 y.o. [name] Primary Care Provider: [name] Other Clinician: Referring Provider: Treating Provider/Extender: [name] in Treatment: 58 Verbal / Phone Orders: No Diagnosis Coding Follow-up Appointments Return appointment in 1 month. - 5 weeks 06/04/22 @1300  w/ Dr. Heber [place] w/ [facility] Room #9 ****EXTRA TIME 75 MINS. HOYET/HIGH ACUITY*** No appt.'s after 11:00 and after 3:00!!**** ****PLEASE WRAP LEGS BEGINNING AT TOES AND GOING TO BEND OF KNEE*** Other: - Pt. to bring in [place] to every [facility] appt. please!!:) Bathing/ Shower/ Hygiene May shower and wash wound with soap and water. - when changing dressing Edema Control - Lymphedema / SCD / Other Elevate legs to the level of the heart or above for 30 minutes daily and/or when sitting, a frequency of: - throughout the day Moisturize legs daily. - with dressing changes Additional Orders / Instructions Follow Nutritious Diet - Monitor/Control Blood Sugars-Continue using ProStat Other: - Go to emergency room for fever, chills, strong odor from wounds, increased pain in wounds Wound Treatment Wound #25 - Lower Leg Wound Laterality: Right, Medial Cleanser: Soap and Water 3 x Per Week/30 Days Discharge Instructions: May shower and wash wound with dial antibacterial soap and water prior to dressing change. Cleanser: Wound Cleanser 3 x Per Week/30 Days Discharge Instructions: Cleanse the wound with wound cleanser prior to applying a clean dressing using gauze sponges, not tissue or  cotton balls. Peri-Wound Care: Zinc Oxide Ointment 30g tube 3 x Per Week/30 Days Discharge Instructions: Apply Zinc Oxide to periwound with each dressing change Peri-Wound Care: Sween Lotion (Moisturizing lotion) 3 x Per Week/30 Days Discharge Instructions: Apply moisturizing lotion as directed Prim Dressing: Maxorb Extra Calcium Alginate Dressing, 4x4 in 3 x Per Week/30 Days ary Discharge Instructions: Apply calcium alginate over the cutimed to help absorb drainage. Prim Dressing: Cutimed Sorbact Swab 3 x Per Week/30 Days ary Discharge Instructions: Apply to wound bed as instructed Prim Dressing: Keystone Antibiotic spray ary 3 x Per Week/30 Days Secondary Dressing: ABD Pad, 8x10 3 x Per Week/30 Days Discharge Instructions: Apply over primary dressing as directed. Compression Wrap: Kerlix Roll 4.5x3.1 (in/yd) 3 x Per Week/30 Days Discharge Instructions: Apply Kerlix and Coban compression as directed. Compression Wrap: Coban Self-Adherent Wrap 4x5 (in/yd) 3 x Per Week/30 Days Discharge Instructions: Apply over Kerlix as directed. Wound #27 - Lower Leg Wound Laterality: Left, Posterior Cleanser: Soap and Water 3 x Per Week/30 Days Discharge Instructions: May shower and wash wound with dial antibacterial soap and water prior to dressing  change. Cleanser: Wound Cleanser 3 x Per Week/30 Days Discharge Instructions: Cleanse the wound with wound cleanser prior to applying a clean dressing using gauze sponges, not tissue or cotton balls. Peri-Wound Care: Zinc Oxide Ointment 30g tube 3 x Per Week/30 Days Discharge Instructions: Apply Zinc Oxide to periwound with each dressing change Peri-Wound Care: Sween Lotion (Moisturizing lotion) 3 x Per Week/30 Days Discharge Instructions: Apply moisturizing lotion as directed Prim Dressing: Maxorb Extra Calcium Alginate Dressing, 4x4 in 3 x Per Week/30 Days ary Discharge Instructions: Apply calcium alginate over the cutimed to help absorb drainage. Prim  Dressing: Cutimed Sorbact Swab 3 x Per Week/30 Days ary Discharge Instructions: Apply to wound bed as instructed Prim Dressing: Keystone Antibiotic spray ary 3 x Per Week/30 Days Secondary Dressing: ABD Pad, 8x10 3 x Per Week/30 Days Discharge Instructions: Apply over primary dressing as directed. Compression Wrap: Kerlix Roll 4.5x3.1 (in/yd) 3 x Per Week/30 Days Discharge Instructions: Apply Kerlix and Coban compression as directed. Compression Wrap: Coban Self-Adherent Wrap 4x5 (in/yd) 3 x Per Week/30 Days Discharge Instructions: Apply over Kerlix as directed. Wound #29 - Lower Leg Wound Laterality: Right, Lateral Cleanser: Soap and Water 3 x Per Week/30 Days Discharge Instructions: May shower and wash wound with dial antibacterial soap and water prior to dressing change. Cleanser: Wound Cleanser 3 x Per Week/30 Days Discharge Instructions: Cleanse the wound with wound cleanser prior to applying a clean dressing using gauze sponges, not tissue or cotton balls. Peri-Wound Care: Zinc Oxide Ointment 30g tube 3 x Per Week/30 Days Discharge Instructions: Apply Zinc Oxide to periwound with each dressing change Peri-Wound Care: Sween Lotion (Moisturizing lotion) 3 x Per Week/30 Days Discharge Instructions: Apply moisturizing lotion as directed Prim Dressing: Maxorb Extra Calcium Alginate Dressing, 4x4 in 3 x Per Week/30 Days ary Discharge Instructions: Apply calcium alginate over the cutimed to help absorb drainage. Prim Dressing: Cutimed Sorbact Swab 3 x Per Week/30 Days ary Discharge Instructions: Apply to wound bed as instructed Prim Dressing: Keystone Antibiotic spray ary 3 x Per Week/30 Days Secondary Dressing: ABD Pad, 8x10 3 x Per Week/30 Days Discharge Instructions: Apply over primary dressing as directed. Compression Wrap: Kerlix Roll 4.5x3.1 (in/yd) 3 x Per Week/30 Days Discharge Instructions: Apply Kerlix and Coban compression as directed. Compression Wrap: Coban Self-Adherent  Wrap 4x5 (in/yd) 3 x Per Week/30 Days Discharge Instructions: Apply over Kerlix as directed. Electronic Signature(s) Signed: 04/30/2022 1:52:58 PM By: [name] DO Entered By: [name] on 04/30/2022 13:49:35 -------------------------------------------------------------------------------- Problem List Details [name], [name] 04/30/2022 1:00 PM Medical Record Number: [id number] Patient Account Number: [id number] Date of Birth/Sex: Treating RN: August 06, 1956 (66 y.o. [name], Lauren Primary Care Provider: [name] Other Clinician: Referring Provider: Treating Provider/Extender: [name] in Treatment: 29 Active Problems ICD-10 Encounter Code Description Active Date MDM Diagnosis I87.333 Chronic venous hypertension (idiopathic) with ulcer and inflammation of 10/08/2021 No Yes bilateral lower extremity 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ed1/16/2023 No Yes L97.912 Non-pressure chronic ulcer of unspecified part of right lower leg with fat layer 10/08/2021 No Yes exposed I89.0 Lymphedema, not elsewhere classified 10/08/2021 No Yes Inactive Problems Resolved Problems Electronic Signature(s) Signed: 04/30/2022 1:52:58 PM By: [name] DO Entered By: [name] on 04/30/2022 13:46:51 -------------------------------------------------------------------------------- Progress Note Details [name], [name] 04/30/2022 1:00 PM Medical Record Number: [id number] Patient Account Number: [id number] Date of Birth/Sex: Treating RN: 12-Apr-1956 (66  y.o. [name] Primary Care Provider: [name] Other Clinician: Referring Provider: Treating Provider/Extender: [name] in Treatment: 29 Subjective Chief Complaint Information obtained from Patient 10/08/2021; bilateral lower extremity  wounds History of Present Illness (HPI) 07/07/15; this is a patient who is in hospital on 8/2 through 8/4. He had cellulitis and abscess of predominantly I think the left leg. He received IV antibiotics. Plain x-ray showed no osteomyelitis. An MRI of the left leg did not show osteomyelitis. Cultures showed no predominant organism. His hemoglobin A1c was 9.8. He has a history of venous stasis also peripheral vascular disease. He was discharged to [facility]. He really has extensive ulcerations on the left lateral leg including a major wound that communicates both posteriorly and superiorly w. When drainage coming out of 2 smaller areas. He has a smaller wound on the posterior medial left leg. He has more predominantly venous insufficiency wounds on predominantly the right medial leg above the ankle the right foot into sections. He has recently been put on Augmentin at the nursing home. Previous ABIs/arterial evaluation showed triphasic waves diffusely he does not have a major ischemic issue a left lower extremity venous duplex also exam showed no evidence of a DVT on 6/21 07/21/15; the patient arrives with really no major change. Culture I did last time was negative. He has not had a follow-up MRI I ordered. The wounds are macerated covered with a thick gelatinous surface slough. There is necrotic subcutaneous tissue 08/04/15; the patient arrives with a considerable improvement in the majority of his wound area. The area on the left leg now has what looks to be a granulated base. Most of the wounds on the left leg required an aggressive surgical debridement to remove nonviable fibrinous eschar and subcutaneous tissue however after debridement most of this looks better. Although I had asked for repeat MRI of the left leg when he first came in here with grossly purulent material coming out of his wounds, it does not appear that this is been done and nor do I actually feel that strongly about  it right now. He has severe surrounding venous insufficiency and inflammation. I don't believe he has significant PAD 08/25/15. In general there is still a considerable wound area here but with still extensive service adherent slough. The patient will not allow mechanical debridement due to pain. He did not tolerate Medihoney therefore we are left with Santyl for now. She would appear that he has severe surrounding venous stasis. There is no evidence of the infection that may have had something to do with the pathogenesis of these wounds 09/29/15; patient still has substantial wound area on the left leg with a cluster of several wounds on the lateral leg confluently on the left leg posteriorly and then a substantial wound on the medial leg. On the right leg a substantial wound medially and a small area on the right lateral foot. All of these underwent a substantial surgical debridement with curettes which she tolerated better than he has in the past. This started as a complex cellulitis in the face of chronic venous insufficiency and inflammation. 10/13/15; substantial cluster of wounds on the lateral aspect of his left leg confluently around to the other side. Extensive surgical debridement to remove for redness surface slough nonviable subcutaneous tissue. This is not an improvement. Also substantial wound on the medial right leg which is largely unchanged. The etiology of this was felt to be a complex cellulitis in the  summer of 2016 in the face of chronic venous insufficiency and inflammation. The patient states that the wound on the right medial leg has been there for years off and on. 10/20/15; we are able to start [facility] to these extensive wound areas left greater than right on Tuesday after I spoke to the wound care nurse at the facility. He arrives here today for extensive surgical debridement for the wounds on the lateral aspect of his left leg posterior left leg, this is  almost circumferential. He has a large wound on the medial aspect of his right leg although he finds this too painful for debridement 10/27/15; I continue to bring this patient back for frequent debridement/weekly debridement severe. We have been using Hydrofera Blue. Unfortunately this has not really had any improvement. The debridement surgery difficult and painful for the patient 11/23/15; this patient spent a complex hospitalization admitted with acute kidney injury, anemia, cellulitis of the lower extremity, he was felt to have sepsis pathophysiology although his blood cultures were negative. He had plain x-rays of both legs that were negative for osseous abnormalities. He was seen by infectious disease and placed on a workup for vasculitis that was negative including a biopsy o4 all were consistent with stasis dermatitis. Vital broad-spectrum antibiotics he continued to spike fevers. Urine and chest x-ray were negative Dopplers on admission ruled out a DVT All antibiotics were stopped on 2/17. . Since his return to [facility] skilled nursing facility I believe they have been using Xeroform 12/07/15 the patient arrives today with the area on his right medial leg actually looking quite stable. No debridement. The rest of his extensive wounds on the left lateral left posterior extending into the left medial leg all required extensive debridement. I think this is probably going to need to and up in the hands of plastic surgery. We'll attempt to change him back to [facility]. Arrange consultation with plastic surgery at [facility] for this almost circumferential wound on the left side 12/21/15 right leg covered in surface slough. This was debridement. Left leg extensive wounds all carefully examined. This is almost circumferential on the left side especially on the posterior calf. No debridement is necessary. 01/04/16 the patient has been to [facility] plastic surgery. Unfortunately it doesn't  look like they had any of the prior workup on this patient. They're in the middle of vascular workup. It sounds as though they're applying Hydrofera Blue at the nursing home. 01/22/16; the patient has been back to see [facility] There apparently making plans to possibly do skin grafts over his large venous insufficiency ulcerations. The patient tells me that he goes to hematology in [facility] and variably uses the term platelets red cells and the description of his problem. 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 I do not see any major difference in the wound on the right medial leg and circumferentially across the left medial to left lateral leg. Did not attempt to debride these today. Readmission: 05/05/18 on evaluation today patient presents for reevaluation here in our clinic although I have previously seen him in the skilled nursing facility over the past year and a half when I was working in the skilled nursing facility realm instead of covering in the clinic. With that being said during that time we had initiated most recently dressings with Hydrofera Blue Dressing along with the [employer] which had done very well for him. Much better than the Kerlex Coban  wraps. With that being said the patient had been doing fairly well and was making progress when I last saw him. Upon evaluation today it appears that the wounds are actually a little bit worse than when I last saw him although definitely not dramatically so. There does not appear to be any evidence of infection which is good news. With that being said he has been tolerating the wraps without complication his left hip still gives him a lot of trouble which is his main issue as far as movement is concerned. Unbeknownst to me he has not had venous studies that I can find. He previously told me he had there were arterial studies noted back from 04/27/15 which showed that he had try phasic blood flow  throughout and again his test appear to be completely normal as performed by Dr. Creig Hines. With that being said I cannot find where he had venous studies. The patient still states he thinks he did these definitely had no venous intervention at this point. Upon evaluation today it does not appear to me that the patient has any evidence of cellulitis. He does have some chronic venous insufficiency which I think has led to stasis dermatitis but again this is not appearing to be infected at this point. No fevers, chills, nausea, or vomiting noted at this time. READMISSION 06/30/2018 This is a now 66 year old man we have had in this clinic at multiple times in the past. Most recently he came in August and was seen by [facility] stone. It is not clear why he did not come back we asked him really did not get a straight answer. He is at [facility] skilled facility he is a man who has severe chronic venous insufficiency with lymphedema and has had severe wounds on his left greater than right leg. We had him here in 2016 and 17 ultimately referred him to [facility]. He had arterial studies and venous studies done at [facility] although I am not able to access these records I see they are actually done. He also had multiple biopsies that were negative for malignancy showing changes compatible with chronic inflammation. As I understand things in [place] they are using Iodoflex and Unna boots. I am not really sure how they are getting enough Iodoflex for the large wound area especially on his left calf. Nevertheless the wounds look better than when I saw this in 2017. There were plans for him to have plastic surgery in 2018 and I think he was actually prepped for surgery however he was ultimately denied because the patient was an active smoker. The patient is not systemically unwell. The wounds are painful however given the size especially on the left this is not surprising. ABIs in this clinic were 0.78 on the  left and 0.91 on the right 07/13/2018; this is a patient with bilateral severe venous ulcers with secondary lymphedema. The wounds are on the right medial calf and a substantial part of the left posterior calf and some involvement medially and laterally on the left. We changed him to silver alginate under Unna boot's last time. The wounds actually look fairly satisfactory today. 08/14/2018; this is a patient with severe bilateral venous stasis ulcers with secondary lymphedema left greater than right he is at [facility] using silver alginate under Unna boot's I do not think there is much change on this visit versus last time I saw him a month ago Readmission: 11/04/18 patient presents today for follow-up concerning his bilateral lower  extremity ulcers. His a right medial ankle ulcer and a left leg that has ulcers over a large proportion of the surface area between the ankle and knee. Unfortunately this does cause some discomfort for the patient although it doesn't seem to be as uncomfortable as it has been in the past. He is seen today for evaluation after a referral by [place] back to the clinic. Unfortunately has a lot of Slough noted on the surface of his wounds. He's been treated currently it sounds like with Dakin's soaked gauze and they have not been performing the Unna Boot wrap that was previously recommended. I'm not exactly sure what the reason for the change was. He does have less slough on the surface of the wound but again this is something that is often a constant issue for him. Again he's had these wounds for many years. I've known him for two years at least during that time when I was taking care of them in the facility he is Artie had the wounds for several years prior. READMISSION 02/25/2020 Josepha Pigg is a now 66 year old man. He has been in our clinic several times before most extensively in October 2016 through May 2017. At that time he had absolutely substantial bilateral lower  extremity wounds secondary to chronic venous insufficiency and lymphedema. We ultimately referred him to [facility] he had a series of biopsies only showed suggestion of wound secondary to chronic venous insufficiency. He had a less extensive stay in our clinic in 2019 and then a single visit in February 2020. He is a resident at [facility] skilled facility. I think he has had intermittently had in facility wound care. He returns today. They are using silver alginate on the wounds although I am not sure what type of compression. Previously he has favored Unna boots. He is not felt to have an arterial issue. Past medical history; lymphedema and chronic venous insufficiency, diabetes mellitus, hypertension, congestive heart failure We did not do arterial studies today 04/27/2020 on evaluation patient appears to be doing really about the same as when I have known him and seen him in the past. He does have wounds on the right and left legs at this point. Fortunately there is no signs of active infection at this time. 9/2; 1 month follow-up. This is a man with severe chronic venous insufficiency and lymphedema. When I first met him he had horrible circumferential bilateral lower extremity ulcers. We have been using silver alginate up until early last month when it was changed to [facility] and Unna boot's more or less says maintenance dressings. Since the last time he was here the facility where he resides [facility] called to report they could no longer do Unna boot so he is apparently only been receiving kerlix Coban the left leg is certainly a lot worse with almost circumferential large wounds especially lateral but now medial and posterior as well. He has a standard same looking wound on the right medial lower leg. 10/1; absolutely no improvement here. In fact I do not think anything much is changed. The substantial area on his left lateral and left posterior calf is still open may be slightly  larger. He has a more modest wound on the right medial calf. None of these look any different. He comes in with a kerlix Coban wrap this will simply not be adequate. He has too much edema in this leg He also is complaining a lot of pain in his left heel area. 11/8; potential  wounds on the left lateral and posterior calf and a smaller oval wound in the right medial. We have been using Hydrofera Blue under 4-layer compression. He was using Unna boots still 2 months ago the facility called to say they can no longer place these so we have been using 4-layer compression. The surface area of the area on the left is so large there is very few alternatives to what we can use on this either silver alginate or Hydrofera Blue. He was complaining of pain on the left heel last time I asked for an x-ray this was apparently done although we do not have the result. He is not complaining of pain today. 08/16/2020 on evaluation today patient is is seen for a early visit due to the fact that he apparently is having issues I was told when they called on Monday with a MRSA infection that they felt like we needed to see him for because his legs "looked horrible". With that being said based on what I see at this point it does not appear that the patient is actually having a terrible infection in fact compared to last time I saw him his legs do not appear to be doing too poorly at all at this time. Nonetheless he has been on doxycycline that is not going to be a good option for him based on the culture report that I have for review today which graded as a partial report with still several organisms pending as far as identification is concerned we did contact them but again they do not have the final report yet. Either way based on what we see it appears that doxycycline is one of the few medications that will not work for the Citrobacter. Obviously I think that a different medication would be a good option for him since  there is a gram-negative organism pending I am likely can I suggest Cipro as the best of backup antibiotic to switch him to at this point. All this was discussed with the patient today. 12/20; not much change in the substantial wound on the left posterior calf and the small area on the right medial. He has a new area on the left anterior which looks like a wrap injury. Had some thoughts about doing a deep tissue culture here although we will put that off for next time. Using silver alginate as a primary dressing 2/10; substantial area on the left posterior calf. This measures smaller but still is a very large area. He has the area on the right anterior medial which also is a fairly large wound but much smaller than not on the left. His edema control is quite good. We have been using silver alginate because of the size of the wounds. 4/7; substantial wound on the left posterior calf and a smaller area on the right medial lower leg. These are very chronic wounds which we have classified is venous. Previously worked up at [facility] for 5 years ago at which time I had sent and will send him over for consideration of extensive skin graft I know they biopsied this many times but he never had plastic surgery. The wound area is much too large for standard size dressings we have been using silver alginate but we did give him a good trial last fall of Hydrofera Blue that did not make any difference either. If anything the area on the right medial lower leg over time has gotten a lot smaller as has the area on the left  although it still quite substantial now. Readmission 10/08/2021 Mr. Aldrick Derrig is a 66 year old male with a past medical history of insulin-dependent type 2 diabetes with last hemoglobin A1c of 10.2, chronic venous insufficiency, lymphedema and chronic diastolic heart failure that presents to the clinic for bilateral lower extremity wounds. He has been followed in our clinic previously for the past  6 years for these issues. He was last seen in April 2022. He is inconsistent with his appointments in our clinic. These wounds have not healed over the past few years. He is currently using silver alginate to the wound beds and keeping these covered with Kerlix. He currently denies systemic signs of infection. He was recently hospitalized for septic shock secondary to bacteremia from likely chronic leg wounds And is currently on oral antibiotics to be completed this week. 1/30; patient presents for follow-up. He has no issues or complaints today. 2/6; patient presents for follow-up. He reports more discomfort with the increase in compression wraps. He currently denies systemic signs of infection and feels well overall. 2/13; patient presents for follow-up. He reports tolerating the current leg/Coban wrap well. He denies systemic signs of infection and feels fine overall. 2/21; patient presents for follow-up. He has no issues or complaints today. He tolerated the kirlex/Coban wrap. He denies systemic signs of infection. 3/21; patient presents for follow-up. He has no issues or complaints today. He saw infectious disease, Dr. Megan Salon. Nothing further to do from an infectious disease point. Levaquin was stopped. He currently denies systemic signs of infection. 4/4; patient presents for follow-up. His facility was able to obtain the [facility] antibiotic and has started using this with dressing changes/wrap changes. Patient has no issues or complaints today. He denies signs of infection. 4/18; patient presents for follow-up. He has no issues or complaints today. He denies signs of infection. 5/9; patient presents for follow-up. He has been using Keystone antibiotic under Kerlix/Coban. He denies signs of infection today. 6/6; monthly follow-up. Patient is at [facility] skilled facility. He has been using Keystone probably silver alginate under kerlix Coban. We use Sorbact here. Wound condition has  improved 7/11; patient presents for follow-up. We have been using Keystone with Sorbact under Kerlix/Coban. Patient has no issues or complaints today. 8/8; patient presents for follow-up. He is still using Keystone and Sorbact under Kerlix/Coban to his lower extremities bilaterally. He has no issues or complaints today. He denies signs of infection. Patient History Information obtained from Patient. Family History Diabetes - Mother,Father,Siblings, Hypertension - Mother,Father, No family history of Cancer, Heart Disease, Hereditary Spherocytosis, Kidney Disease, Lung Disease, Seizures, Stroke, Thyroid Problems, Tuberculosis. Social History Current every day smoker - 4 cig per day, Marital Status - Separated, Alcohol Use - Never, Drug Use - Prior History - cocaine, [place] quit 2005, Caffeine Use - Moderate. Medical History Eyes Denies history of Cataracts, Glaucoma, Optic Neuritis Ear/Nose/Mouth/Throat Denies history of Chronic sinus problems/congestion, Middle ear problems Hematologic/Lymphatic Patient has history of Anemia - iron deficiency Denies history of Hemophilia, Human Immunodeficiency Virus, Lymphedema, Sickle Cell Disease Respiratory Patient has history of Sleep Apnea Denies history of Aspiration, Asthma, Chronic Obstructive Pulmonary Disease (COPD), Pneumothorax, Tuberculosis Cardiovascular Patient has history of Congestive Heart Failure, Coronary Artery Disease, Hypertension, Peripheral Venous Disease Denies history of Angina, Arrhythmia, Deep Vein Thrombosis, Hypotension, Myocardial Infarction, Peripheral Arterial Disease, Phlebitis, Vasculitis Gastrointestinal Denies history of Cirrhosis , Colitis, Crohnoos, Hepatitis A, Hepatitis B, Hepatitis C Endocrine Patient has history of Type II Diabetes - uncontrolled Denies history of Type I  Diabetes Genitourinary Denies history of End Stage Renal Disease Immunological Denies history of Lupus Erythematosus, Raynaudoos,  Scleroderma Integumentary (Skin) Denies history of History of Burn Musculoskeletal Patient has history of Osteoarthritis Denies history of Gout, Rheumatoid Arthritis, Osteomyelitis Neurologic Patient has history of Neuropathy Denies history of Dementia, Quadriplegia, Paraplegia, Seizure Disorder Oncologic Denies history of Received Chemotherapy, Received Radiation Psychiatric Denies history of Anorexia/bulimia, Confinement Anxiety Hospitalization/Surgery History - inguinal hernia repair with mesh. - right shoulder rotator cuff repair. - toe nail excision. - Sepsis 09/03/21-09/10/22. Medical A Surgical History Notes nd Gastrointestinal GERD Musculoskeletal degenerative righ thip Psychiatric depression Objective Constitutional respirations regular, non-labored and within target range for patient.. Vitals Time Taken: 1:19 PM, Height: 77 in, Weight: 258 lbs, BMI: 30.6, Temperature: 98.7 F, Pulse: 74 bpm, Respiratory Rate: 17 breaths/min, Blood Pressure: 134/74 mmHg. Psychiatric pleasant and cooperative. General Notes: Bilateral lower extremity wounds with granulation tissue and nonviable tissue, left > right. Overall wound beds do not appear healthy. No signs of surrounding infection. Evidence of lymphedema skin changes bilaterally to the lower extremities. Integumentary (Hair, Skin) Wound #25 status is Open. Original cause of wound was Gradually Appeared. The date acquired was: 09/07/2021. The wound has been in treatment 29 weeks. The wound is located on the Right,Medial Lower Leg. The wound measures 5.3cm length x 2.5cm width x 0.2cm depth; 10.407cm^2 area and 2.081cm^3 volume. There is Fat Layer (Subcutaneous Tissue) exposed. There is no tunneling or undermining noted. There is a large amount of serosanguineous drainage noted. The wound margin is distinct with the outline attached to the wound base. There is large (67-100%) red, pink granulation within the wound bed. There is  no necrotic tissue within the wound bed. Wound #27 status is Open. Original cause of wound was Gradually Appeared. The date acquired was: 09/07/2021. The wound has been in treatment 29 weeks. The wound is located on the Left,Posterior Lower Leg. The wound measures 12cm length x 14cm width x 0.3cm depth; 131.947cm^2 area and 39.584cm^3 volume. There is Fat Layer (Subcutaneous Tissue) exposed. There is no tunneling or undermining noted. There is a medium amount of serosanguineous drainage noted. The wound margin is distinct with the outline attached to the wound base. There is large (67-100%) red, friable granulation within the wound bed. There is no necrotic tissue within the wound bed. Wound #29 status is Open. Original cause of wound was Gradually Appeared. The date acquired was: 04/30/2022. The wound is located on the Right,Lateral Lower Leg. The wound measures 1.1cm length x 0.7cm width x 0.2cm depth; 0.605cm^2 area and 0.121cm^3 volume. There is Fat Layer (Subcutaneous Tissue) exposed. There is no tunneling or undermining noted. There is a medium amount of serosanguineous drainage noted. There is small (1-33%) red, pink granulation within the wound bed. There is a large (67-100%) amount of necrotic tissue within the wound bed including Adherent Slough. Assessment Active Problems ICD-10 Chronic venous hypertension (idiopathic) with ulcer and inflammation of bilateral lower extremity Non-pressure chronic ulcer of other part of left lower leg with fat layer exposed Non-pressure chronic ulcer of unspecified part of right lower leg with fat layer exposed Lymphedema, not elsewhere classified Overall wounds are stable. He has a new wound to the right lateral leg. I debrided nonviable tissue. I recommended continuing with [facility] and Sorbact we will add calcium alginate backing to help with drainage absorbent's. Continue Kerlix/Coban. Follow-up in 1 month. Procedures Wound #25 Pre-procedure  diagnosis of Wound #25 is a Venous Leg Ulcer located on the Right,Medial Lower Leg .  Severity of Tissue Pre Debridement is: Fat layer exposed. There was a Excisional Skin/Subcutaneous Tissue Debridement with a total area of 13.25 sq cm performed by [name], DO. With the following instrument(s): Curette to remove Viable and Non-Viable tissue/material. Material removed includes Subcutaneous Tissue and Slough and after achieving pain control using Lidocaine. No specimens were taken. A time out was conducted at 13:35, prior to the start of the procedure. A Minimum amount of bleeding was controlled with Pressure. The procedure was tolerated well with a pain level of 0 throughout and a pain level of 0 following the procedure. Post Debridement Measurements: 5.3cm length x 2.5cm width x 0.2cm depth; 2.081cm^3 volume. Character of Wound/Ulcer Post Debridement is improved. Severity of Tissue Post Debridement is: Fat layer exposed. Post procedure Diagnosis Wound #25: Same as Pre-Procedure Wound #27 Pre-procedure diagnosis of Wound #27 is a Venous Leg Ulcer located on the Left,Posterior Lower Leg .Severity of Tissue Pre Debridement is: Fat layer exposed. There was a Excisional Skin/Subcutaneous Tissue Debridement with a total area of 168 sq cm performed by [name], DO. With the following instrument(s): Curette to remove Viable and Non-Viable tissue/material. Material removed includes Subcutaneous Tissue and Slough and after achieving pain control using Lidocaine. No specimens were taken. A time out was conducted at 13:35, prior to the start of the procedure. A Minimum amount of bleeding was controlled with Pressure. The procedure was tolerated well with a pain level of 0 throughout and a pain level of 0 following the procedure. Post Debridement Measurements: 12cm length x 14cm width x 0.3cm depth; 39.584cm^3 volume. Character of Wound/Ulcer Post Debridement is improved. Severity of Tissue Post  Debridement is: Fat layer exposed. Post procedure Diagnosis Wound #27: Same as Pre-Procedure Wound #29 Pre-procedure diagnosis of Wound #29 is a Venous Leg Ulcer located on the Right,Lateral Lower Leg .Severity of Tissue Pre Debridement is: Fat layer exposed. There was a Excisional Skin/Subcutaneous Tissue Debridement with a total area of 0.77 sq cm performed by [name], DO. With the following instrument(s): Curette to remove Viable and Non-Viable tissue/material. Material removed includes Subcutaneous Tissue and Slough and after achieving pain control using Lidocaine. No specimens were taken. A time out was conducted at 13:35, prior to the start of the procedure. A Minimum amount of bleeding was controlled with Pressure. The procedure was tolerated well with a pain level of 0 throughout and a pain level of 0 following the procedure. Post Debridement Measurements: 1.1cm length x 0.7cm width x 0.2cm depth; 0.121cm^3 volume. Character of Wound/Ulcer Post Debridement is improved. Severity of Tissue Post Debridement is: Fat layer exposed. Post procedure Diagnosis Wound #29: Same as Pre-Procedure Plan Follow-up Appointments: Return appointment in 1 month. - 5 weeks 06/04/22 @1300  w/ Dr. Heber Spencer w/ [facility] Room #9 ****EXTRA TIME 75 MINS. HOYET/HIGH ACUITY*** No appt.'s after 11:00 and after 3:00!!**** ****PLEASE WRAP LEGS BEGINNING AT TOES AND GOING TO BEND OF KNEE*** Other: - Pt. to bring in [place] to every [facility] appt. please!!:) Bathing/ Shower/ Hygiene: May shower and wash wound with soap and water. - when changing dressing Edema Control - Lymphedema / SCD / Other: Elevate legs to the level of the heart or above for 30 minutes daily and/or when sitting, a frequency of: - throughout the day Moisturize legs daily. - with dressing changes Additional Orders / Instructions: Follow Nutritious Diet - Monitor/Control Blood Sugars-Continue using ProStat Other: - Go to emergency room for fever,  chills, strong odor from wounds, increased pain in wounds WOUND #25: -  Lower Leg Wound Laterality: Right, Medial Cleanser: Soap and Water 3 x Per Week/30 Days Discharge Instructions: May shower and wash wound with dial antibacterial soap and water prior to dressing change. Cleanser: Wound Cleanser 3 x Per Week/30 Days Discharge Instructions: Cleanse the wound with wound cleanser prior to applying a clean dressing using gauze sponges, not tissue or cotton balls. Peri-Wound Care: Zinc Oxide Ointment 30g tube 3 x Per Week/30 Days Discharge Instructions: Apply Zinc Oxide to periwound with each dressing change Peri-Wound Care: Sween Lotion (Moisturizing lotion) 3 x Per Week/30 Days Discharge Instructions: Apply moisturizing lotion as directed Prim Dressing: Maxorb Extra Calcium Alginate Dressing, 4x4 in 3 x Per Week/30 Days ary Discharge Instructions: Apply calcium alginate over the cutimed to help absorb drainage. Prim Dressing: Cutimed Sorbact Swab 3 x Per Week/30 Days ary Discharge Instructions: Apply to wound bed as instructed Prim Dressing: Keystone Antibiotic spray 3 x Per Week/30 Days ary Secondary Dressing: ABD Pad, 8x10 3 x Per Week/30 Days Discharge Instructions: Apply over primary dressing as directed. Com pression Wrap: Kerlix Roll 4.5x3.1 (in/yd) 3 x Per Week/30 Days Discharge Instructions: Apply Kerlix and Coban compression as directed. Com pression Wrap: Coban Self-Adherent Wrap 4x5 (in/yd) 3 x Per Week/30 Days Discharge Instructions: Apply over Kerlix as directed. WOUND #27: - Lower Leg Wound Laterality: Left, Posterior Cleanser: Soap and Water 3 x Per Week/30 Days Discharge Instructions: May shower and wash wound with dial antibacterial soap and water prior to dressing change. Cleanser: Wound Cleanser 3 x Per Week/30 Days Discharge Instructions: Cleanse the wound with wound cleanser prior to applying a clean dressing using gauze sponges, not tissue or cotton balls. Peri-Wound  Care: Zinc Oxide Ointment 30g tube 3 x Per Week/30 Days Discharge Instructions: Apply Zinc Oxide to periwound with each dressing change Peri-Wound Care: Sween Lotion (Moisturizing lotion) 3 x Per Week/30 Days Discharge Instructions: Apply moisturizing lotion as directed Prim Dressing: Maxorb Extra Calcium Alginate Dressing, 4x4 in 3 x Per Week/30 Days ary Discharge Instructions: Apply calcium alginate over the cutimed to help absorb drainage. Prim Dressing: Cutimed Sorbact Swab 3 x Per Week/30 Days ary Discharge Instructions: Apply to wound bed as instructed Prim Dressing: Keystone Antibiotic spray 3 x Per Week/30 Days ary Secondary Dressing: ABD Pad, 8x10 3 x Per Week/30 Days Discharge Instructions: Apply over primary dressing as directed. Com pression Wrap: Kerlix Roll 4.5x3.1 (in/yd) 3 x Per Week/30 Days Discharge Instructions: Apply Kerlix and Coban compression as directed. Com pression Wrap: Coban Self-Adherent Wrap 4x5 (in/yd) 3 x Per Week/30 Days Discharge Instructions: Apply over Kerlix as directed. WOUND #29: - Lower Leg Wound Laterality: Right, Lateral Cleanser: Soap and Water 3 x Per Week/30 Days Discharge Instructions: May shower and wash wound with dial antibacterial soap and water prior to dressing change. Cleanser: Wound Cleanser 3 x Per Week/30 Days Discharge Instructions: Cleanse the wound with wound cleanser prior to applying a clean dressing using gauze sponges, not tissue or cotton balls. Peri-Wound Care: Zinc Oxide Ointment 30g tube 3 x Per Week/30 Days Discharge Instructions: Apply Zinc Oxide to periwound with each dressing change Peri-Wound Care: Sween Lotion (Moisturizing lotion) 3 x Per Week/30 Days Discharge Instructions: Apply moisturizing lotion as directed Prim Dressing: Maxorb Extra Calcium Alginate Dressing, 4x4 in 3 x Per Week/30 Days ary Discharge Instructions: Apply calcium alginate over the cutimed to help absorb drainage. Prim Dressing: Cutimed  Sorbact Swab 3 x Per Week/30 Days ary Discharge Instructions: Apply to wound bed as instructed Prim Dressing: Keystone Antibiotic spray  3 x Per Week/30 Days ary Secondary Dressing: ABD Pad, 8x10 3 x Per Week/30 Days Discharge Instructions: Apply over primary dressing as directed. Com pression Wrap: Kerlix Roll 4.5x3.1 (in/yd) 3 x Per Week/30 Days Discharge Instructions: Apply Kerlix and Coban compression as directed. Com pression Wrap: Coban Self-Adherent Wrap 4x5 (in/yd) 3 x Per Week/30 Days Discharge Instructions: Apply over Kerlix as directed. 1. In office sharp debridement 2. Keystone with Sorbact and calcium alginate under Kerlix/Coban 3. Follow-up in 1 month Electronic Signature(s) Signed: 04/30/2022 1:52:58 PM By: [name] DO Entered By: [name] on 04/30/2022 13:50:47 -------------------------------------------------------------------------------- HxROS Details [name], [name] 04/30/2022 1:00 PM Medical Record Number: [id number] Patient Account Number: [id number] Date of Birth/Sex: Treating RN: 1956-07-27 (66 y.o. [name] Primary Care Provider: [name] Other Clinician: Referring Provider: Treating Provider/Extender: [name] in Treatment: 29 Information Obtained From Patient Eyes Medical History: Negative for: Cataracts; Glaucoma; Optic Neuritis Ear/Nose/Mouth/Throat Medical History: Negative for: Chronic sinus problems/congestion; Middle ear problems Hematologic/Lymphatic Medical History: Positive for: Anemia - iron deficiency Negative for: Hemophilia; Human Immunodeficiency Virus; Lymphedema; Sickle Cell Disease Respiratory Medical History: Positive for: Sleep Apnea Negative for: Aspiration; Asthma; Chronic Obstructive Pulmonary Disease (COPD); Pneumothorax; Tuberculosis Cardiovascular Medical History: Positive for: Congestive Heart Failure; Coronary Artery Disease;  Hypertension; Peripheral Venous Disease Negative for: Angina; Arrhythmia; Deep Vein Thrombosis; Hypotension; Myocardial Infarction; Peripheral Arterial Disease; Phlebitis; Vasculitis Gastrointestinal Medical History: Negative for: Cirrhosis ; Colitis; Crohns; Hepatitis A; Hepatitis B; Hepatitis C Past Medical History Notes: GERD Endocrine Medical History: Positive for: Type II Diabetes - uncontrolled Negative for: Type I Diabetes Time with diabetes: 1999 Treated with: Insulin Blood sugar tested every day: Yes T ested : 3x per day Blood sugar testing results: Breakfast: 81-200; Lunch: 180-210; Dinner: 200 Genitourinary Medical History: Negative for: End Stage Renal Disease Immunological Medical History: Negative for: Lupus Erythematosus; Raynauds; Scleroderma Integumentary (Skin) Medical History: Negative for: History of Burn Musculoskeletal Medical History: Positive for: Osteoarthritis Negative for: Gout; Rheumatoid Arthritis; Osteomyelitis Past Medical History Notes: degenerative righ thip Neurologic Medical History: Positive for: Neuropathy Negative for: Dementia; Quadriplegia; Paraplegia; Seizure Disorder Oncologic Medical History: Negative for: Received Chemotherapy; Received Radiation Psychiatric Medical History: Negative for: Anorexia/bulimia; Confinement Anxiety Past Medical History Notes: depression Immunizations Pneumococcal Vaccine: Received Pneumococcal Vaccination: Yes Received Pneumococcal Vaccination On or After 60th Birthday: No Implantable Devices None Hospitalization / Surgery History Type of Hospitalization/Surgery inguinal hernia repair with mesh right shoulder rotator cuff repair toe nail excision Sepsis 09/03/21-09/10/22 Family and Social History Cancer: No; Diabetes: Yes - Mother,Father,Siblings; Heart Disease: No; Hereditary Spherocytosis: No; Hypertension: Yes - Mother,Father; Kidney Disease: No; Lung Disease: No; Seizures: No; Stroke:  No; Thyroid Problems: No; Tuberculosis: No; Current every day smoker - 4 cig per day; Marital Status - Separated; Alcohol Use: Never; Drug Use: Prior History - cocaine, [place] quit 2005; Caffeine Use: Moderate; Financial Concerns: No; Food, Clothing or Shelter Needs: No; Support System Lacking: No; Transportation Concerns: No Electronic Signature(s) Signed: 04/30/2022 1:52:58 PM By: [name] DO Signed: 04/30/2022 4:31:06 PM By: Rhae Hammock RN Entered By: [name] on 04/30/2022 13:47:45 -------------------------------------------------------------------------------- SuperBill Details [name], [name] 04/30/2022 Medical Record Number: [id number] Patient Account Number: [id number] Date of Birth/Sex: Treating RN: [birth year] (66 y.o. 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 I89.0 Lymphedema, not elsewhere classified Facility Procedures CPT4 Code: 16109604 Description: 54098 - DEB SUBQ TISSUE 20 SQ CM/< ICD-10 Diagnosis Description 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 L97.912 Non-pressure chronic ulcer of unspecified part of right lower leg with fat laye Modifier: ed r exposed Quantity: 1 CPT4 Code: 11914782 Description: 95621 - DEB SUBQ TISS EA ADDL 20CM ICD-10 Diagnosis Description 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 L97.912 Non-pressure chronic ulcer of unspecified part of  right lower leg with fat laye Modifier: ed r exposed Quantity: 9 Physician Procedures : CPT4 Code Description Modifier [id number] 11042 - WC PHYS SUBQ TISS 20 SQ CM ICD-10 Diagnosis Description 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ed L97.912 Non-pressure chronic ulcer of unspecified part of  right lower leg with fat layer exposed Quantity: 1 : [id number] 11045 - WC PHYS SUBQ TISS EA ADDL 20 CM ICD-10 Diagnosis Description 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ed L97.912 Non-pressure chronic ulcer of unspecified part of right lower leg with fat  layer exposed Quantity: 9 Electronic Signature(s) Signed: 04/30/2022 1:52:58 PM By: [name] DO Entered By: [name] on 04/30/2022 13:51:06

## 2022-04-30 NOTE — Progress Notes (Signed)
[name], [name] ([id number]) Visit Report for 04/30/2022 Arrival Information Details [name], [name] 04/30/2022 1:00 PM Medical Record Number: [id number] Patient Account Number: [id number] Date of Birth/Sex: Treating RN: 1955/12/24 (66 y.o. [name], [name] Primary Care [name]: [name] Other Clinician: Referring [name]: Treating [name]/Extender: [name] in Treatment: 29 Visit Information History Since Last Visit Added or deleted any medications: No Patient Arrived: Wheel Chair Any new allergies or adverse reactions: No Arrival Time: 13:17 Had a fall or experienced change in No Accompanied By: cna activities of daily living that may affect Transfer Assistance: Manual risk of falls: Patient Identification Verified: Yes Signs or symptoms of abuse/neglect since last visito No Secondary Verification Process Completed: Yes Hospitalized since last visit: No Patient Requires Transmission-Based No Implantable device outside of the clinic excluding No Precautions: cellular tissue based products placed in the center Patient Has Alerts: Yes since last visit: Patient Alerts: Patient on Blood Thinner Has Dressing in Place as Prescribed: Yes *NO BLOOD Has Compression in Place as Prescribed: Yes PRODUCTS* Pain Present Now: Yes Electronic Signature(s) Signed: 04/30/2022 4:31:06 PM By: Rhae Hammock RN Entered By: Rhae Hammock on 04/30/2022 13:17:36 -------------------------------------------------------------------------------- Encounter Discharge Information Details [name], [name] 04/30/2022 1:00 PM Medical Record Number: [id number] Patient Account Number: [id number] Date of Birth/Sex: Treating RN: Sep 18, 1956 (66 y.o. [name], [name] Primary Care [name]: [name] Other Clinician: Referring [name]: Treating [name]/Extender: [name] in  Treatment: 29 Encounter Discharge Information Items Post Procedure Vitals Discharge Condition: Stable Temperature (F): 98.7 Ambulatory Status: Wheelchair Pulse (bpm): 74 Discharge Destination: [place] Respiratory Rate (breaths/min): 17 Telephoned: No Blood Pressure (mmHg): 134/74 Orders Sent: Yes Transportation: Private Auto Accompanied By: CNA Schedule Follow-up Appointment: Yes Clinical Summary of Care: Patient Declined Electronic Signature(s) Signed: 04/30/2022 4:31:06 PM By: Rhae Hammock RN Entered By: Rhae Hammock on 04/30/2022 14:03:58 -------------------------------------------------------------------------------- Lower Extremity Assessment Details [name], [name] 04/30/2022 1:00 PM Medical Record Number: [id number] Patient Account Number: [id number] Date of Birth/Sex: Treating RN: [birth year] (66 y.o. [name] Primary Care Danniel Tones: [name] Other Clinician: Referring Joliyah Lippens: Treating [name]/Extender: [name] in Treatment: 29 Edema Assessment Assessed: Shirlyn Goltz: Yes] Patrice Paradise: Yes] Edema: [Left: Yes] [Right: Yes] Calf Left: Right: Point of Measurement: 37 cm From Medial Instep 40 cm 41 cm Ankle Left: Right: Point of Measurement: 12 cm From Medial Instep 25.4 cm 26.5 cm Vascular Assessment Pulses: Dorsalis Pedis Palpable: [Left:Yes] [Right:Yes] Posterior Tibial Palpable: [Left:Yes] [Right:Yes] Electronic Signature(s) Signed: 04/30/2022 4:31:06 PM By: Rhae Hammock RN Entered By: Rhae Hammock on 04/30/2022 13:19:39 -------------------------------------------------------------------------------- Multi Wound Chart Details [name], [name] 04/30/2022 1:00 PM Medical Record Number: [id number] Patient Account Number: [id number] Date of Birth/Sex: Treating RN: [birth year] (66 y.o. [name], [name] Primary Care Mikya Don: [name] Other Clinician: Referring [name]: Treating [name]/Extender: [name] in Treatment: 29 Vital Signs Height(in): 77 Pulse(bpm): 77 Weight(lbs): 24 Blood Pressure(mmHg): 134/74 Body Mass Index(BMI): 30.6 Temperature(F): 98.7 Respiratory Rate(breaths/min): 17 Photos: [25:Right, Medial Lower Leg] [27:Left, Posterior Lower Leg] [29:Right, Lateral Lower Leg] Wound Location: [25:Gradually Appeared] [27:Gradually Appeared] [29:Gradually Appeared] Wounding Event: [25:Venous Leg Ulcer] [27:Venous Leg Ulcer] [29:Venous Leg Ulcer] Primary Etiology: [25:Anemia, Sleep Apnea, Congestive] [27:Anemia, Sleep Apnea, Congestive] [29:Anemia, Sleep Apnea, Congestive] Comorbid History: [25:Heart Failure, Coronary Artery Disease, Hypertension, Peripheral Venous Disease, Type II Diabetes, Osteoarthritis, Neuropathy 09/07/2021] [27:Heart Failure, Coronary  Artery Disease, Hypertension, Peripheral Venous Disease, Type II  Diabetes, Osteoarthritis, Neuropathy 09/07/2021] [29:Heart Failure, Coronary Artery Disease, Hypertension, Peripheral Venous Disease, Type II Diabetes, Osteoarthritis, Neuropathy 04/30/2022] Date Acquired: [25:29] [27:29] [29:0] Weeks of Treatment: [25:Open] [27:Open] [29:Open] Wound Status: [25:No] [27:No] [29:No] Wound Recurrence: [25:5.3x2.5x0.2] [27:12x14x0.3] [29:1.1x0.7x0.2] Measurements L x W x D (cm) [25:10.407] [27:131.947] [29:0.605] A (cm) : rea [25:2.081] [27:39.584] [29:0.121] Volume (cm) : [25:13.40%] [27:49.10%] [29:0.00%] % Reduction in A [25:rea: 42.30%] [27:49.10%] [29:0.00%] % Reduction in Volume: [25:Full Thickness Without Exposed] [27:Full Thickness Without Exposed] [29:Full Thickness Without Exposed] Classification: [25:Support Structures Large] [27:Support Structures Medium] [29:Support Structures Medium] Exudate A mount: [25:Serosanguineous] [27:Serosanguineous] [29:Serosanguineous] Exudate Type: [25:red, brown] [27:red, brown]  [29:red, brown] Exudate Color: [25:Distinct, outline attached] [27:Distinct, outline attached] [29:N/A] Wound Margin: [25:Large (67-100%)] [27:Large (67-100%)] [29:Small (1-33%)] Granulation A mount: [25:Red, Pink] [27:Red, Friable] [29:Red, Pink] Granulation Quality: [25:None Present (0%)] [27:None Present (0%)] [29:Large (67-100%)] Necrotic A mount: [25:Fat Layer (Subcutaneous Tissue): Yes Fat Layer (Subcutaneous Tissue): Yes Fat Layer (Subcutaneous Tissue): Yes] Exposed Structures: [25:Fascia: No Tendon: No Muscle: No Joint: No Bone: No Small (1-33%)] [27:Fascia: No Tendon: No Muscle: No Joint: No Bone: No Small (1-33%)] [29:Fascia: No Tendon: No Muscle: No Joint: No Bone: No None] Epithelialization: [25:Debridement - Excisional] [27:Debridement - Excisional] [29:Debridement - Excisional] Debridement: Pre-procedure Verification/Time Out 13:35 [27:13:35] [29:13:35] Taken: [25:Lidocaine] [27:Lidocaine] [29:Lidocaine] Pain Control: [25:Subcutaneous, Slough] [27:Subcutaneous, Slough] [29:Subcutaneous, Slough] Tissue Debrided: [25:Skin/Subcutaneous Tissue] [27:Skin/Subcutaneous Tissue] [29:Skin/Subcutaneous Tissue] Level: [25:13.25] [78:295] [29:0.77] Debridement A (sq cm): [25:rea Curette] [27:Curette] [29:Curette] Instrument: [25:Minimum] [27:Minimum] [29:Minimum] Bleeding: [25:Pressure] [27:Pressure] [29:Pressure] Hemostasis A chieved: [25:0] [27:0] [29:0] Procedural Pain: [25:0] [27:0] [29:0] Post Procedural Pain: [25:Procedure was tolerated well] [27:Procedure was tolerated well] [29:Procedure was tolerated well] Debridement Treatment Response: [25:5.3x2.5x0.2] [27:12x14x0.3] [29:1.1x0.7x0.2] Post Debridement Measurements L x W x D (cm) [25:2.081] [27:39.584] [62:1.308] Post Debridement Volume: (cm) [25:Debridement] [27:Debridement] [29:Debridement] Treatment Notes Electronic Signature(s) Signed: 04/30/2022 1:52:58 PM By: [name] DO Signed: 04/30/2022 4:31:06 PM By: Rhae Hammock RN Entered By: [name] on 04/30/2022 13:46:57 -------------------------------------------------------------------------------- Multi-Disciplinary Care Plan Details [name], [name] 04/30/2022 1:00 PM Medical Record Number: [id number] Patient Account Number: [id number] Date of Birth/Sex: Treating RN: 03-17-1956 (66 y.o. [name], [name] Primary Care Crystalynn Mcinerney: [name] Other Clinician: Referring Gladiola Madore: Treating Johnanthony Wilden/Extender: [name] in Treatment: [place] reviewed with physician Active Inactive Venous Leg Ulcer Nursing Diagnoses: Actual venous Insuffiency (use after diagnosis is confirmed) Goals: Patient will maintain optimal edema control Date Initiated: 10/08/2021 Target Resolution Date: 05/25/2022 Goal Status: Active Interventions: Compression as ordered Provide education on venous insufficiency Treatment Activities: Therapeutic compression applied : 10/08/2021 Notes: Wound/Skin Impairment Nursing Diagnoses: Impaired tissue integrity Goals: Patient/caregiver will verbalize understanding of skin care regimen Date Initiated: 10/08/2021 Target Resolution Date: 05/25/2022 Goal Status: Active Ulcer/skin breakdown will have a volume reduction of 30% by week 4 Date Initiated: 10/08/2021 Date Inactivated: 11/05/2021 Target Resolution Date: 11/05/2021 Goal Status: Unmet Unmet Reason: infection Ulcer/skin breakdown will have a volume reduction of 50% by week 8 Date Initiated: 11/05/2021 Target Resolution Date: 05/25/2022 Goal Status: Active Interventions: Assess patient/caregiver ability to perform ulcer/skin care regimen upon admission and as needed Assess ulceration(s) every visit Provide education on ulcer and skin care Treatment Activities: Topical wound management initiated : 10/08/2021 Notes: Electronic Signature(s) Signed: 04/30/2022 4:31:06 PM By: Rhae Hammock  RN Entered By: Rhae Hammock on 04/30/2022 14:02:47 -------------------------------------------------------------------------------- Pain Assessment Details [name], [name] 04/30/2022  1:00 PM Medical Record Number: [id number] Patient Account Number: [id number] Date of Birth/Sex: Treating RN: Jun 13, 1956 (66 y.o. [name], [name] Primary Care Karington Zarazua: [name] Other Clinician: Referring Bintou Lafata: Treating Camelia Stelzner/Extender: [name] in Treatment: 29 Active Problems Location of Pain Severity and Description of Pain Patient Has Paino Yes Site Locations Pain Location: Pain Location: Generalized Pain, Pain in Ulcers With Dressing Change: Yes Duration of the Pain. Constant / Intermittento Constant Rate the pain. Current Pain Level: 8 Worst Pain Level: 10 Least Pain Level: 0 Tolerable Pain Level: 8 Character of Pain Describe the Pain: Aching Pain Management and Medication Current Pain Management: Medication: No Cold Application: No Rest: No Massage: No Activity: No T.E.N.S.: No Heat Application: No Leg drop or elevation: No Is the Current Pain Management Adequate: Adequate How does your wound impact your activities of daily livingo Sleep: No Bathing: No Appetite: No Relationship With Others: No Bladder Continence: No Emotions: No Bowel Continence: No Work: No Toileting: No Drive: No Dressing: No Hobbies: No Electronic Signature(s) Signed: 04/30/2022 4:31:06 PM By: Rhae Hammock RN Entered By: Rhae Hammock on 04/30/2022 13:18:55 -------------------------------------------------------------------------------- Patient/Caregiver Education Details [name], [name] 8/8/2023andnbsp1:00 PM Medical Record Number: [id number] Patient Account Number: [id number] Date of Birth/Gender: Treating RN: [birth year] (66 y.o. [name] Primary Care Physician:  [name] Other Clinician: Referring Physician: Treating Physician/Extender: [name] in Treatment: 29 Education Assessment Education Provided To: Patient Education Topics Provided Venous: Methods: Explain/Verbal Responses: State content correctly Electronic Signature(s) Signed: 04/30/2022 4:31:06 PM By: Rhae Hammock RN Entered By: Rhae Hammock on 04/30/2022 14:03:00 -------------------------------------------------------------------------------- Wound Assessment Details [name], [name] 04/30/2022 1:00 PM Medical Record Number: [id number] Patient Account Number: [id number] Date of Birth/Sex: Treating RN: May 09, 1956 (66 y.o. [name], [name] Primary Care Delsin Copen: [name] Other Clinician: Referring Yeraldin Litzenberger: Treating Earmon Sherrow/Extender: [name] in Treatment: 29 Wound Status Wound Number: 25 Primary Venous Leg Ulcer Etiology: Wound Location: Right, Medial Lower Leg Wound Open Wounding Event: Gradually Appeared Status: Date Acquired: 09/07/2021 Comorbid Anemia, Sleep Apnea, Congestive Heart Failure, Coronary Artery Weeks Of Treatment: 29 History: Disease, Hypertension, Peripheral Venous Disease, Type II Clustered Wound: No Diabetes, Osteoarthritis, Neuropathy Photos Wound Measurements Length: (cm) 5.3 Width: (cm) 2.5 Depth: (cm) 0.2 Area: (cm) 10.407 Volume: (cm) 2.081 % Reduction in Area: 13.4% % Reduction in Volume: 42.3% Epithelialization: Small (1-33%) Tunneling: No Undermining: No Wound Description Classification: Full Thickness Without Exposed Support Structures Wound Margin: Distinct, outline attached Exudate Amount: Large Exudate Type: Serosanguineous Exudate Color: red, brown Foul Odor After Cleansing: No Slough/Fibrino No Wound Bed Granulation Amount: Large (67-100%) Exposed Structure Granulation Quality: Red, Pink Fascia Exposed:  No Necrotic Amount: None Present (0%) Fat Layer (Subcutaneous Tissue) Exposed: Yes Tendon Exposed: No Muscle Exposed: No Joint Exposed: No Bone Exposed: No Treatment Notes Wound #25 (Lower Leg) Wound Laterality: Right, Medial Cleanser Soap and Water Discharge Instruction: May shower and wash wound with dial antibacterial soap and water prior to dressing change. Wound Cleanser Discharge Instruction: Cleanse the wound with wound cleanser prior to applying a clean dressing using gauze sponges, not tissue or cotton balls. Peri-Wound Care Zinc Oxide Ointment 30g tube Discharge Instruction: Apply Zinc Oxide to periwound with each dressing change Sween Lotion (Moisturizing lotion) Discharge Instruction: Apply moisturizing lotion as directed Topical Primary Dressing Maxorb Extra Calcium Alginate Dressing, 4x4 in Discharge Instruction: Apply calcium alginate over the cutimed to help absorb drainage. Cutimed Sorbact  Swab Discharge Instruction: Apply to wound bed as instructed Keystone Antibiotic spray Secondary Dressing ABD Pad, 8x10 Discharge Instruction: Apply over primary dressing as directed. Secured With Compression Wrap Kerlix Roll 4.5x3.1 (in/yd) Discharge Instruction: Apply Kerlix and Coban compression as directed. Coban Self-Adherent Wrap 4x5 (in/yd) Discharge Instruction: Apply over Kerlix as directed. Compression Stockings Add-Ons Electronic Signature(s) Signed: 04/30/2022 4:31:06 PM By: Rhae Hammock RN Signed: 04/30/2022 5:29:00 PM By: [name] RN, BSN Entered By: [name] on 04/30/2022 13:21:11 -------------------------------------------------------------------------------- Wound Assessment Details [name], [name] 04/30/2022 1:00 PM Medical Record Number: [id number] Patient Account Number: [id number] Date of Birth/Sex: Treating RN: [birth year] (66 y.o. [name], [name] Primary Care Lise Pincus: [name] Other  Clinician: Referring Khiyan Crace: Treating Kortni Hasten/Extender: [name] in Treatment: 29 Wound Status Wound Number: 27 Primary Venous Leg Ulcer Etiology: Wound Location: Left, Posterior Lower Leg Wound Open Wounding Event: Gradually Appeared Status: Date Acquired: 09/07/2021 Comorbid Anemia, Sleep Apnea, Congestive Heart Failure, Coronary Artery Weeks Of Treatment: 29 History: Disease, Hypertension, Peripheral Venous Disease, Type II Clustered Wound: No Diabetes, Osteoarthritis, Neuropathy Photos Wound Measurements Length: (cm) 12 Width: (cm) 14 Depth: (cm) 0.3 Area: (cm) 131.947 Volume: (cm) 39.584 % Reduction in Area: 49.1% % Reduction in Volume: 49.1% Epithelialization: Small (1-33%) Tunneling: No Undermining: No Wound Description Classification: Full Thickness Without Exposed Support Structures Wound Margin: Distinct, outline attached Exudate Amount: Medium Exudate Type: Serosanguineous Exudate Color: red, brown Foul Odor After Cleansing: No Slough/Fibrino No Wound Bed Granulation Amount: Large (67-100%) Exposed Structure Granulation Quality: Red, Friable Fascia Exposed: No Necrotic Amount: None Present (0%) Fat Layer (Subcutaneous Tissue) Exposed: Yes Tendon Exposed: No Muscle Exposed: No Joint Exposed: No Bone Exposed: No Treatment Notes Wound #27 (Lower Leg) Wound Laterality: Left, Posterior Cleanser Soap and Water Discharge Instruction: May shower and wash wound with dial antibacterial soap and water prior to dressing change. Wound Cleanser Discharge Instruction: Cleanse the wound with wound cleanser prior to applying a clean dressing using gauze sponges, not tissue or cotton balls. Peri-Wound Care Zinc Oxide Ointment 30g tube Discharge Instruction: Apply Zinc Oxide to periwound with each dressing change Sween Lotion (Moisturizing lotion) Discharge Instruction: Apply moisturizing lotion as directed Topical Primary  Dressing Maxorb Extra Calcium Alginate Dressing, 4x4 in Discharge Instruction: Apply calcium alginate over the cutimed to help absorb drainage. Cutimed Sorbact Swab Discharge Instruction: Apply to wound bed as instructed Keystone Antibiotic spray Secondary Dressing ABD Pad, 8x10 Discharge Instruction: Apply over primary dressing as directed. Secured With Compression Wrap Kerlix Roll 4.5x3.1 (in/yd) Discharge Instruction: Apply Kerlix and Coban compression as directed. Coban Self-Adherent Wrap 4x5 (in/yd) Discharge Instruction: Apply over Kerlix as directed. Compression Stockings Add-Ons Electronic Signature(s) Signed: 04/30/2022 4:31:06 PM By: Rhae Hammock RN Signed: 04/30/2022 5:29:00 PM By: [name] RN, BSN Entered By: [name] on 04/30/2022 13:22:23 -------------------------------------------------------------------------------- Wound Assessment Details [name], [name] 04/30/2022 1:00 PM Medical Record Number: [id number] Patient Account Number: [id number] Date of Birth/Sex: Treating RN: December 02, 1955 (66 y.o. [name] Primary Care Huel Centola: [name] Other Clinician: Referring Aleksei Goodlin: Treating Sheralyn Pinegar/Extender: [name] in Treatment: 29 Wound Status Wound Number: 29 Primary Venous Leg Ulcer Etiology: Wound Location: Right, Lateral Lower Leg Wound Open Wounding Event: Gradually Appeared Status: Date Acquired: 04/30/2022 Comorbid Anemia, Sleep Apnea, Congestive Heart Failure, Coronary Artery Weeks Of Treatment: 0 History: Disease, Hypertension, Peripheral Venous Disease, Type II Clustered Wound: No Diabetes, Osteoarthritis, Neuropathy Photos Wound Measurements Length: (cm) 1.1  Width: (cm) 0.7 Depth: (cm) 0.2 Area: (cm) 0.605 Volume: (cm) 0.121 % Reduction in Area: 0% % Reduction in Volume: 0% Epithelialization: None Tunneling: No Undermining: No Wound  Description Classification: Full Thickness Without Exposed Support Structures Exudate Amount: Medium Exudate Type: Serosanguineous Exudate Color: red, brown Foul Odor After Cleansing: No Slough/Fibrino Yes Wound Bed Granulation Amount: Small (1-33%) Exposed Structure Granulation Quality: Red, Pink Fascia Exposed: No Necrotic Amount: Large (67-100%) Fat Layer (Subcutaneous Tissue) Exposed: Yes Necrotic Quality: Adherent Slough Tendon Exposed: No Muscle Exposed: No Joint Exposed: No Bone Exposed: No Treatment Notes Wound #29 (Lower Leg) Wound Laterality: Right, Lateral Cleanser Soap and Water Discharge Instruction: May shower and wash wound with dial antibacterial soap and water prior to dressing change. Wound Cleanser Discharge Instruction: Cleanse the wound with wound cleanser prior to applying a clean dressing using gauze sponges, not tissue or cotton balls. Peri-Wound Care Zinc Oxide Ointment 30g tube Discharge Instruction: Apply Zinc Oxide to periwound with each dressing change Sween Lotion (Moisturizing lotion) Discharge Instruction: Apply moisturizing lotion as directed Topical Primary Dressing Maxorb Extra Calcium Alginate Dressing, 4x4 in Discharge Instruction: Apply calcium alginate over the cutimed to help absorb drainage. Cutimed Sorbact Swab Discharge Instruction: Apply to wound bed as instructed Keystone Antibiotic spray Secondary Dressing ABD Pad, 8x10 Discharge Instruction: Apply over primary dressing as directed. Secured With Compression Wrap Kerlix Roll 4.5x3.1 (in/yd) Discharge Instruction: Apply Kerlix and Coban compression as directed. Coban Self-Adherent Wrap 4x5 (in/yd) Discharge Instruction: Apply over Kerlix as directed. Compression Stockings Add-Ons Electronic Signature(s) Signed: 04/30/2022 5:29:00 PM By: [name] RN, BSN Entered By: [name] on 04/30/2022  13:24:42 -------------------------------------------------------------------------------- Vitals Details [name], [name] 04/30/2022 1:00 PM Medical Record Number: [id number] Patient Account Number: [id number] Date of Birth/Sex: Treating RN: 1956-01-10 (66 y.o. [name], [name] Primary Care Kirk Basquez: [name] Other Clinician: Referring Cornie Mccomber: Treating Roxan Yamamoto/Extender: [name] in Treatment: 29 Vital Signs Time Taken: 13:19 Temperature (F): 98.7 Height (in): 77 Pulse (bpm): 74 Weight (lbs): 258 Respiratory Rate (breaths/min): 17 Body Mass Index (BMI): 30.6 Blood Pressure (mmHg): 134/74 Reference Range: 80 - 120 mg / dl Electronic Signature(s) Signed: 04/30/2022 4:31:06 PM By: Rhae Hammock RN Entered By: Rhae Hammock on 04/30/2022 13:19:26

## 2022-06-04 ENCOUNTER — Encounter (HOSPITAL_BASED_OUTPATIENT_CLINIC_OR_DEPARTMENT_OTHER): Payer: Medicare Other | Attending: Internal Medicine | Admitting: Internal Medicine

## 2022-06-04 DIAGNOSIS — I890 Lymphedema, not elsewhere classified: Secondary | ICD-10-CM | POA: Diagnosis not present

## 2022-06-04 DIAGNOSIS — E11622 Type 2 diabetes mellitus with other skin ulcer: Secondary | ICD-10-CM | POA: Diagnosis not present

## 2022-06-04 DIAGNOSIS — I5032 Chronic diastolic (congestive) heart failure: Secondary | ICD-10-CM | POA: Insufficient documentation

## 2022-06-04 DIAGNOSIS — I87333 Chronic venous hypertension (idiopathic) with ulcer and inflammation of bilateral lower extremity: Secondary | ICD-10-CM | POA: Insufficient documentation

## 2022-06-04 DIAGNOSIS — L97812 Non-pressure chronic ulcer of other part of right lower leg with fat layer exposed: Secondary | ICD-10-CM | POA: Diagnosis not present

## 2022-06-04 DIAGNOSIS — L97912 Non-pressure chronic ulcer of unspecified part of right lower leg with fat layer exposed: Secondary | ICD-10-CM

## 2022-06-04 DIAGNOSIS — L97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ed: Secondary | ICD-10-CM | POA: Insufficient documentation

## 2022-06-04 DIAGNOSIS — E440 Moderate protein-calorie malnutrition: Secondary | ICD-10-CM | POA: Insufficient documentation

## 2022-06-06 NOTE — Progress Notes (Signed)
[name], [name] ([id number]) Visit Report for 06/04/2022 Arrival Information Details [name], [name] 06/04/2022 1:00 PM Medical Record Number: [id number] Patient Account Number: [id number] Date of Birth/Sex: Treating RN: [name][birth year] (66 y.o. [name], [name] Primary Care [name]: [name] Other Clinician: Referring [name]: Treating [name]/Extender: [name] in Treatment: 39 Visit Information History Since Last Visit Added or deleted any medications: No Patient Arrived: Wheel Chair Any new allergies or adverse reactions: No Arrival Time: 12:57 Had a fall or experienced change in No Accompanied By: aide activities of daily living that may affect Transfer Assistance: None risk of falls: Patient Identification Verified: Yes Signs or symptoms of abuse/neglect since last visito No Secondary Verification Process Completed: Yes Hospitalized since last visit: No Patient Requires Transmission-Based No Implantable device outside of the clinic excluding No Precautions: cellular tissue based products placed in the center Patient Has Alerts: Yes since last visit: Patient Alerts: Patient on Blood Thinner Has Compression in Place as Prescribed: Yes *NO BLOOD Pain Present Now: Yes PRODUCTS* Electronic Signature(s) Signed: 06/04/2022 4:33:24 PM By: [name] Entered By: [name] on 06/04/2022 12:57:33 -------------------------------------------------------------------------------- Encounter Discharge Information Details [name], [name] 06/04/2022 1:00 PM Medical Record Number: [id number] Patient Account Number: [id number] Date of Birth/Sex: Treating RN: [name] (66 y.o. [name] Primary Care Dallas Torok: [name] Other Clinician: Referring [name]: Treating [name]/Extender: [name] in Treatment: 69 Encounter Discharge Information Items  Post Procedure Vitals Discharge Condition: Stable Temperature (F): 98.2 Ambulatory Status: Wheelchair Pulse (bpm): 69 Discharge Destination: Home Respiratory Rate (breaths/min): 18 Transportation: Private Auto Blood Pressure (mmHg): 120/70 Accompanied By: self Schedule Follow-up Appointment: Yes Clinical Summary of Care: Electronic Signature(s) Signed: 06/04/2022 5:16:45 PM By: [name] Pilling RN, BSN Entered By: [name] on 06/04/2022 16:50:34 -------------------------------------------------------------------------------- Lower Extremity Assessment Details [name], [name] 06/04/2022 1:00 PM Medical Record Number: [id number] Patient Account Number: [id number] Date of Birth/Sex: Treating RN: [name][birth year] (66 y.o. [name], [name] Primary Care Kwinton Maahs: [name] Other Clinician: Referring Jozette Castrellon: Treating [name]/Extender: [name] in Treatment: 34 Edema Assessment Assessed: Shirlyn Goltz: Yes] [name]: Yes] Edema: [Left: Yes] [Right: Yes] Calf Left: Right: Point of Measurement: 37 cm From Medial Instep 41.8 cm 41.6 cm Ankle Left: Right: Point of Measurement: 12 cm From Medial Instep 25.5 cm 25.6 cm Vascular Assessment Pulses: Dorsalis Pedis Palpable: [Left:Yes] [Right:Yes] Electronic Signature(s) Signed: 06/04/2022 4:33:24 PM By: [name] Signed: 06/06/2022 4:08:15 PM By: [name] Hammock RN Entered By: [name] on 06/04/2022 13:22:12 -------------------------------------------------------------------------------- Multi Wound Chart Details [name], [name] 06/04/2022 1:00 PM Medical Record Number: [id number] Patient Account Number: [id number] Date of Birth/Sex: Treating RN: [birth year] (66 y.o. [name], [name] Primary Care Margia Wiesen: [name] Other Clinician: Referring Koah Chisenhall: Treating [name]/Extender: [name] in  Treatment: 34 Vital Signs Height(in): 77 Pulse(bpm): 6 Weight(lbs): 258 Blood Pressure(mmHg): 120/70 Body Mass Index(BMI): 30.6 Temperature(F): 98.2 Respiratory Rate(breaths/min): 18 Photos: [25:Right, Medial Lower Leg] [27:Left, Posterior Lower Leg] [29:Right, Lateral Lower Leg] Wound Location: [25:Gradually Appeared] [27:Gradually Appeared] [29:Gradually Appeared] Wounding Event: [25:Venous Leg Ulcer] [27:Venous Leg Ulcer] [29:Venous Leg Ulcer] Primary Etiology: [25:Anemia, Sleep Apnea, Congestive] [27:Anemia, Sleep Apnea, Congestive] [29:Anemia, Sleep Apnea, Congestive] Comorbid History: [25:Heart Failure, Coronary Artery Disease, Hypertension, Peripheral Venous Disease, Type II Diabetes, Osteoarthritis, Neuropathy 09/07/2021] [27:Heart Failure, Coronary Artery Disease, Hypertension, Peripheral Venous Disease, Type II  Diabetes, Osteoarthritis, Neuropathy 09/07/2021] [29:Heart  Failure, Coronary Artery Disease, Hypertension, Peripheral Venous Disease, Type II Diabetes, Osteoarthritis, Neuropathy 04/30/2022] Date Acquired: [25:34] [27:34] [29:5] Weeks of Treatment: [25:Open] [27:Open] [29:Open] Wound Status: [25:No] [27:No] [29:No] Wound Recurrence: [25:5.7x2.4x0.2] [27:17x15x0.3] [29:1.3x1.4x0.2] Measurements L x W x D (cm) [25:10.744] [27:200.277] [29:1.429] A (cm) : rea [25:2.149] [27:60.083] [29:0.286] Volume (cm) : [25:10.60%] [27:22.70%] [29:-136.20%] % Reduction in A [25:rea: 40.40%] [27:22.70%] [29:-136.40%] % Reduction in Volume: [25:Full Thickness Without Exposed] [27:Full Thickness Without Exposed] [29:Full Thickness Without Exposed] Classification: [25:Support Structures Medium] [27:Support Structures Medium] [29:Support Structures Medium] Exudate A mount: [25:Serosanguineous] [27:Serosanguineous] [29:Serosanguineous] Exudate Type: [25:red, brown] [27:red, brown] [29:red, brown] Exudate Color: [25:Thickened] [27:Distinct, outline attached] [29:Distinct, outline  attached] Wound Margin: [25:Large (67-100%)] [27:Large (67-100%)] [29:Medium (34-66%)] Granulation A mount: [25:Red, Pink] [27:Red, Friable] [29:Red, Pink] Granulation Quality: [25:None Present (0%)] [27:None Present (0%)] [29:Medium (34-66%)] Necrotic A mount: [25:Fat Layer (Subcutaneous Tissue): Yes Fat Layer (Subcutaneous Tissue): Yes Fat Layer (Subcutaneous Tissue): Yes] Exposed Structures: [25:Fascia: No Tendon: No Muscle: No Joint: No Bone: No Small (1-33%)] [27:Fascia: No Tendon: No Muscle: No Joint: No Bone: No Small (1-33%)] [29:Fascia: No Tendon: No Muscle: No Joint: No Bone: No None] Epithelialization: [25:Debridement - Excisional] [27:Debridement - Excisional] [29:Debridement - Excisional] Debridement: Pre-procedure Verification/Time Out 13:30 [27:13:30] [29:13:30] Taken: [25:Lidocaine 5% topical ointment] [27:Lidocaine 5% topical ointment] [29:Lidocaine 5% topical ointment] Pain Control: [25:Subcutaneous, Slough] [27:Subcutaneous, Slough] [29:Subcutaneous, Slough] Tissue Debrided: [25:Skin/Subcutaneous Tissue] [27:Skin/Subcutaneous Tissue] [29:Skin/Subcutaneous Tissue] Level: [25:13.68] [27:255] [29:1.82] Debridement A (sq cm): [25:rea Curette] [27:Curette] [29:Curette] Instrument: [25:Moderate] [27:Moderate] [29:Moderate] Bleeding: [25:Pressure] [27:Pressure] [29:Pressure] Hemostasis A chieved: [25:0] [27:0] [29:0] Procedural Pain: [25:0] [27:0] [29:0] Post Procedural Pain: [25:Procedure was tolerated well] [27:Procedure was tolerated well] [29:Procedure was tolerated well] Debridement Treatment Response: [25:5.7x2.4x0.2] [27:17x15x0.3] [29:1.3x1.4x0.2] Post Debridement Measurements L x W x D (cm) [25:2.149] [39:76.734] [29:0.286] Post Debridement Volume: (cm) [25:Debridement] [27:Debridement] [29:Debridement] Treatment Notes Electronic Signature(s) Signed: 06/04/2022 2:17:28 PM By: [name] DO Signed: 06/06/2022 4:08:15 PM By: [name] Hammock RN Entered By:  [name] on 06/04/2022 13:50:40 -------------------------------------------------------------------------------- Multi-Disciplinary Care Plan Details [name], [name] 06/04/2022 1:00 PM Medical Record Number: [id number] Patient Account Number: [id number] Date of Birth/Sex: Treating RN: [birth year] (66 y.o. [name] Primary Care Naylene Foell: [name] Other Clinician: Referring Vance Hochmuth: Treating Rakeen Gaillard/Extender: [name] in Treatment: 51 Multidisciplinary Care Plan reviewed with physician Active Inactive Venous Leg Ulcer Nursing Diagnoses: Actual venous Insuffiency (use after diagnosis is confirmed) Goals: Patient will maintain optimal edema control Date Initiated: 10/08/2021 Target Resolution Date: 07/19/2022 Goal Status: Active Interventions: Compression as ordered Provide education on venous insufficiency Treatment Activities: Therapeutic compression applied : 10/08/2021 Notes: Wound/Skin Impairment Nursing Diagnoses: Impaired tissue integrity Goals: Patient/caregiver will verbalize understanding of skin care regimen Date Initiated: 10/08/2021 Target Resolution Date: 07/19/2022 Goal Status: Active Ulcer/skin breakdown will have a volume reduction of 30% by week 4 Date Initiated: 10/08/2021 Date Inactivated: 11/05/2021 Target Resolution Date: 11/05/2021 Goal Status: Unmet Unmet Reason: infection Ulcer/skin breakdown will have a volume reduction of 50% by week 8 Date Initiated: 11/05/2021 Date Inactivated: 06/04/2022 Target Resolution Date: 05/25/2022 Goal Status: Met Interventions: Assess patient/caregiver ability to perform ulcer/skin care regimen upon admission and as needed Assess ulceration(s) every visit Provide education on ulcer and skin care Treatment Activities: Topical wound management initiated : 10/08/2021 Notes: Electronic Signature(s) Signed: 06/04/2022 5:16:45 PM By: [name] Pilling  RN, BSN Entered By: [name] on 06/04/2022 13:38:22 -------------------------------------------------------------------------------- Pain Assessment Details [name], [name] 06/04/2022 1:00  PM Medical Record Number: [id number] Patient Account Number: [id number] Date of Birth/Sex: Treating RN: [name]/[birth year] (66 y.o. [name] Primary Care Canuto Kingston: [name] Other Clinician: Referring Lorrane Mccay: Treating Rigdon Macomber/Extender: [name] in Treatment: 70 Active Problems Location of Pain Severity and Description of Pain Patient Has Paino Yes Site Locations Pain Location: Pain Location: Pain in Ulcers Rate the pain. Current Pain Level: 8 Pain Management and Medication Current Pain Management: Electronic Signature(s) Signed: 06/04/2022 4:33:24 PM By: [name] Signed: 06/06/2022 4:08:15 PM By: [name] Hammock RN Entered By: [name] on 06/04/2022 12:58:13 -------------------------------------------------------------------------------- Patient/Caregiver Education Details [name], [name] 9/12/2023andnbsp1:00 PM Medical Record Number: [id number] Patient Account Number: [id number] Date of Birth/Gender: Treating RN: [birth year] [name] (66 y.o. [name] Primary Care Physician: [name] Other Clinician: Referring Physician: Treating Physician/Extender: [name] in Treatment: 48 Education Assessment Education Provided To: Patient Education Topics Provided Wound/Skin Impairment: Handouts: Skin Care Do's and Dont's Methods: Explain/Verbal Responses: Reinforcements needed Electronic Signature(s) Signed: 06/04/2022 5:16:45 PM By: [name] Pilling RN, BSN Entered By: [name] on 06/04/2022 13:38:34 -------------------------------------------------------------------------------- Wound Assessment Details Patient Name: Date of  Service: [name], [name] 06/04/2022 1:00 PM Medical Record Number: [id number] Patient Account Number: [id number] Date of Birth/Sex: Treating RN: [birth year]-[name] (66 y.o. [name], [name] Primary Care Draiden Mirsky: [name] Other Clinician: Referring Breannah Kratt: Treating Amisadai Woodford/Extender: [name] in Treatment: 34 Wound Status Wound Number: 25 Primary Venous Leg Ulcer Etiology: Wound Location: Right, Medial Lower Leg Wound Open Wounding Event: Gradually Appeared Status: Date Acquired: 09/07/2021 Comorbid Anemia, Sleep Apnea, Congestive Heart Failure, Coronary Artery Weeks Of Treatment: 34 History: Disease, Hypertension, Peripheral Venous Disease, Type II Clustered Wound: No Diabetes, Osteoarthritis, Neuropathy Photos Wound Measurements Length: (cm) 5.7 Width: (cm) 2.4 Depth: (cm) 0.2 Area: (cm) 10.744 Volume: (cm) 2.149 % Reduction in Area: 10.6% % Reduction in Volume: 40.4% Epithelialization: Small (1-33%) Wound Description Classification: Full Thickness Without Exposed Support Structures Wound Margin: Thickened Exudate Amount: Medium Exudate Type: Serosanguineous Exudate Color: red, brown Foul Odor After Cleansing: No Slough/Fibrino No Wound Bed Granulation Amount: Large (67-100%) Exposed Structure Granulation Quality: Red, Pink Fascia Exposed: No Necrotic Amount: None Present (0%) Fat Layer (Subcutaneous Tissue) Exposed: Yes Tendon Exposed: No Muscle Exposed: No Joint Exposed: No Bone Exposed: No Treatment Notes Wound #25 (Lower Leg) Wound Laterality: Right, Medial Cleanser Soap and Water Discharge Instruction: May shower and wash wound with dial antibacterial soap and water prior to dressing change. Wound Cleanser Discharge Instruction: Cleanse the wound with wound cleanser prior to applying a clean dressing using gauze sponges, not tissue or cotton balls. Peri-Wound Care Zinc Oxide Ointment 30g tube Discharge Instruction:  Apply Zinc Oxide to periwound with each dressing change Sween Lotion (Moisturizing lotion) Discharge Instruction: Apply moisturizing lotion as directed Topical Primary Dressing Maxorb Extra Calcium Alginate Dressing, 4x4 in Discharge Instruction: Apply calcium alginate over the cutimed to help absorb drainage. Cutimed Sorbact Swab Discharge Instruction: Apply to wound bed as instructed Keystone Antibiotic spray Secondary Dressing ABD Pad, 8x10 Discharge Instruction: Apply over primary dressing as directed. Secured With Compression Wrap Kerlix Roll 4.5x3.1 (in/yd) Discharge Instruction: Apply Kerlix and Coban compression as directed. Coban Self-Adherent Wrap 4x5 (in/yd) Discharge Instruction: Apply over Kerlix as directed. Compression Stockings Add-Ons Electronic Signature(s) Signed: 06/04/2022 4:33:24 PM By: [name] Signed: 06/06/2022 4:08:15 PM By: [name] Hammock RN Entered By: [name] on 06/04/2022 13:27:20 -------------------------------------------------------------------------------- Wound Assessment Details [name],  [name] 06/04/2022 1:00 PM Medical Record Number: [id number] Patient Account Number: [id number] Date of Birth/Sex: Treating RN: [name][birth year] (66 y.o. [name], [name] Primary Care Suhas Estis: [name] Other Clinician: Referring Thaila Bottoms: Treating Debara Kamphuis/Extender: [name] in Treatment: 34 Wound Status Wound Number: 27 Primary Venous Leg Ulcer Etiology: Wound Location: Left, Posterior Lower Leg Wound Open Wounding Event: Gradually Appeared Status: Date Acquired: 09/07/2021 Comorbid Anemia, Sleep Apnea, Congestive Heart Failure, Coronary Artery Weeks Of Treatment: 34 History: Disease, Hypertension, Peripheral Venous Disease, Type II Clustered Wound: No Diabetes, Osteoarthritis, Neuropathy Photos Wound Measurements Length: (cm) 17 Width: (cm) 15 Depth: (cm) 0.3 Area:  (cm) 200.277 Volume: (cm) 60.083 % Reduction in Area: 22.7% % Reduction in Volume: 22.7% Epithelialization: Small (1-33%) Tunneling: No Undermining: No Wound Description Classification: Full Thickness Without Exposed Support Structures Wound Margin: Distinct, outline attached Exudate Amount: Medium Exudate Type: Serosanguineous Exudate Color: red, brown Foul Odor After Cleansing: No Slough/Fibrino No Wound Bed Granulation Amount: Large (67-100%) Exposed Structure Granulation Quality: Red, Friable Fascia Exposed: No Necrotic Amount: None Present (0%) Fat Layer (Subcutaneous Tissue) Exposed: Yes Tendon Exposed: No Muscle Exposed: No Joint Exposed: No Bone Exposed: No Treatment Notes Wound #27 (Lower Leg) Wound Laterality: Left, Posterior Cleanser Soap and Water Discharge Instruction: May shower and wash wound with dial antibacterial soap and water prior to dressing change. Wound Cleanser Discharge Instruction: Cleanse the wound with wound cleanser prior to applying a clean dressing using gauze sponges, not tissue or cotton balls. Peri-Wound Care Zinc Oxide Ointment 30g tube Discharge Instruction: Apply Zinc Oxide to periwound with each dressing change Sween Lotion (Moisturizing lotion) Discharge Instruction: Apply moisturizing lotion as directed Topical Primary Dressing Maxorb Extra Calcium Alginate Dressing, 4x4 in Discharge Instruction: Apply calcium alginate over the cutimed to help absorb drainage. Cutimed Sorbact Swab Discharge Instruction: Apply to wound bed as instructed Keystone Antibiotic spray Secondary Dressing ABD Pad, 8x10 Discharge Instruction: Apply over primary dressing as directed. Secured With Compression Wrap Kerlix Roll 4.5x3.1 (in/yd) Discharge Instruction: Apply Kerlix and Coban compression as directed. Coban Self-Adherent Wrap 4x5 (in/yd) Discharge Instruction: Apply over Kerlix as directed. Compression Stockings Add-Ons Electronic  Signature(s) Signed: 06/04/2022 4:33:24 PM By: [name] Signed: 06/06/2022 4:08:15 PM By: [name] Hammock RN Entered By: [name] on 06/04/2022 13:26:57 -------------------------------------------------------------------------------- Wound Assessment Details [name], [name] 06/04/2022 1:00 PM Medical Record Number: [id number] Patient Account Number: [id number] Date of Birth/Sex: Treating RN: [birth year]-[name] (66 y.o. [name], [name] Primary Care Fatema Rabe: [name] Other Clinician: Referring Braxston Quinter: Treating Lauran Romanski/Extender: [name] in Treatment: 34 Wound Status Wound Number: 29 Primary Venous Leg Ulcer Etiology: Wound Location: Right, Lateral Lower Leg Wound Open Wounding Event: Gradually Appeared Status: Date Acquired: 04/30/2022 Comorbid Anemia, Sleep Apnea, Congestive Heart Failure, Coronary Artery Weeks Of Treatment: 5 History: Disease, Hypertension, Peripheral Venous Disease, Type II Clustered Wound: No Diabetes, Osteoarthritis, Neuropathy Photos Wound Measurements Length: (cm) 1.3 Width: (cm) 1.4 Depth: (cm) 0.2 Area: (cm) 1.429 Volume: (cm) 0.286 % Reduction in Area: -136.2% % Reduction in Volume: -136.4% Epithelialization: None Tunneling: No Undermining: No Wound Description Classification: Full Thickness Without Exposed Support Structures Wound Margin: Distinct, outline attached Exudate Amount: Medium Exudate Type: Serosanguineous Exudate Color: red, brown Foul Odor After Cleansing: No Slough/Fibrino Yes Wound Bed Granulation Amount: Medium (34-66%) Exposed Structure Granulation Quality: Red, Pink Fascia Exposed: No Necrotic Amount: Medium (34-66%) Fat Layer (Subcutaneous Tissue) Exposed: Yes Necrotic Quality: Adherent Slough Tendon Exposed: No Muscle Exposed: No Joint Exposed:  No Bone Exposed: No Treatment Notes Wound #29 (Lower Leg) Wound Laterality: Right,  Lateral Cleanser Soap and Water Discharge Instruction: May shower and wash wound with dial antibacterial soap and water prior to dressing change. Wound Cleanser Discharge Instruction: Cleanse the wound with wound cleanser prior to applying a clean dressing using gauze sponges, not tissue or cotton balls. Peri-Wound Care Zinc Oxide Ointment 30g tube Discharge Instruction: Apply Zinc Oxide to periwound with each dressing change Sween Lotion (Moisturizing lotion) Discharge Instruction: Apply moisturizing lotion as directed Topical Primary Dressing Maxorb Extra Calcium Alginate Dressing, 4x4 in Discharge Instruction: Apply calcium alginate over the cutimed to help absorb drainage. Cutimed Sorbact Swab Discharge Instruction: Apply to wound bed as instructed Keystone Antibiotic spray Secondary Dressing ABD Pad, 8x10 Discharge Instruction: Apply over primary dressing as directed. Secured With Compression Wrap Kerlix Roll 4.5x3.1 (in/yd) Discharge Instruction: Apply Kerlix and Coban compression as directed. Coban Self-Adherent Wrap 4x5 (in/yd) Discharge Instruction: Apply over Kerlix as directed. Compression Stockings Add-Ons Electronic Signature(s) Signed: 06/04/2022 4:33:24 PM By: [name] Signed: 06/06/2022 4:08:15 PM By: [name] Hammock RN Entered By: [name] on 06/04/2022 13:27:49 -------------------------------------------------------------------------------- Vitals Details [name], [name] 06/04/2022 1:00 PM Medical Record Number: [id number] Patient Account Number: [id number] Date of Birth/Sex: Treating RN: [birth year] [name] (66 y.o. [name], [name] Primary Care Hanaa Payes: [name] Other Clinician: Referring Zakkiyya Barno: Treating Daneshia Tavano/Extender: [name] in Treatment: 34 Vital Signs Time Taken: 12:57 Temperature (F): 98.2 Height (in): 77 Pulse (bpm): 69 Weight (lbs): 258 Respiratory Rate  (breaths/min): 18 Body Mass Index (BMI): 30.6 Blood Pressure (mmHg): 120/70 Reference Range: 80 - 120 mg / dl Electronic Signature(s) Signed: 06/04/2022 4:33:24 PM By: [name] Entered By: [name] on 06/04/2022 12:58:00

## 2022-06-06 NOTE — Progress Notes (Signed)
Berntsen, Quinn ([id number]) Visit Report for 06/04/2022 Chief Complaint Document Details [name], [name] 06/04/2022 1:00 PM Medical Record Number: [id number] Patient Account Number: [id number] Date of Birth/Sex: Treating RN: 03-Dec-1955 (66 y.o. [name], Lauren Primary Care Provider: [name] Other Clinician: Referring Provider: Treating Provider/Extender: [name] in Treatment: 99 Information Obtained from: Patient Chief Complaint 10/08/2021; bilateral lower extremity wounds Electronic Signature(s) Signed: 06/04/2022 2:17:28 PM By: [name] DO Entered By: [name] on 06/04/2022 13:43:39 -------------------------------------------------------------------------------- Debridement Details [name], [name] 06/04/2022 1:00 PM Medical Record Number: [id number] Patient Account Number: [id number] Date of Birth/Sex: Treating RN: [birth year] (65 y.o. [name] Primary Care Provider: [name] Other Clinician: Referring Provider: Treating Provider/Extender: [name] in Treatment: 34 Debridement Performed for Assessment: Wound #25 Right,Medial Lower Leg Performed By: Physician [name], DO Debridement Type: Debridement Severity of Tissue Pre Debridement: Fat layer exposed Level of Consciousness (Pre-procedure): Awake and Alert Pre-procedure Verification/Time Out Yes - 13:30 Taken: Start Time: 13:31 Pain Control: Lidocaine 5% topical ointment T Area Debrided (L x W): otal 5.7 (cm) x 2.4 (cm) = 13.68 (cm) Tissue and other material debrided: Viable, Non-Viable, Slough, Subcutaneous, Skin: Dermis , Skin: Epidermis, Slough Level: Skin/Subcutaneous Tissue Debridement Description: Excisional Instrument: Curette Bleeding: Moderate Hemostasis Achieved: Pressure End Time: 13:40 Procedural Pain: 0 Post Procedural Pain: 0 Response to  Treatment: Procedure was tolerated well Level of Consciousness (Post- Awake and Alert procedure): Post Debridement Measurements of Total Wound Length: (cm) 5.7 Width: (cm) 2.4 Depth: (cm) 0.2 Volume: (cm) 2.149 Character of Wound/Ulcer Post Debridement: Improved Severity of Tissue Post Debridement: Fat layer exposed Post Procedure Diagnosis Same as Pre-procedure Electronic Signature(s) Signed: 06/04/2022 2:17:28 PM By: [name] DO Signed: 06/04/2022 5:16:45 PM By: [name] RN, BSN Entered By: [name] on 06/04/2022 13:39:30 -------------------------------------------------------------------------------- Debridement Details [name], [name] 06/04/2022 1:00 PM Medical Record Number: [id number] Patient Account Number: [id number] Date of Birth/Sex: Treating RN: 03-Dec-1955 (66 y.o. [name] Primary Care Provider: [name] Other Clinician: Referring Provider: Treating Provider/Extender: [name] in Treatment: 34 Debridement Performed for Assessment: Wound #27 Left,Posterior Lower Leg Performed By: Physician [name], DO Debridement Type: Debridement Severity of Tissue Pre Debridement: Fat layer exposed Level of Consciousness (Pre-procedure): Awake and Alert Pre-procedure Verification/Time Out Yes - 13:30 Taken: Start Time: 13:31 Pain Control: Lidocaine 5% topical ointment T Area Debrided (L x W): otal 17 (cm) x 15 (cm) = 255 (cm) Tissue and other material debrided: Viable, Non-Viable, Slough, Subcutaneous, Skin: Dermis , Skin: Epidermis, Slough Level: Skin/Subcutaneous Tissue Debridement Description: Excisional Instrument: Curette Bleeding: Moderate Hemostasis Achieved: Pressure End Time: 13:40 Procedural Pain: 0 Post Procedural Pain: 0 Response to Treatment: Procedure was tolerated well Level of Consciousness (Post- Awake and Alert procedure): Post Debridement Measurements  of Total Wound Length: (cm) 17 Width: (cm) 15 Depth: (cm) 0.3 Volume: (cm) 60.083 Character of Wound/Ulcer Post Debridement: Improved Severity of Tissue Post Debridement: Fat layer exposed Post Procedure Diagnosis Same as Pre-procedure Electronic Signature(s) Signed: 06/04/2022 2:17:28 PM By: [name] DO Signed: 06/04/2022 5:16:45 PM By: [name] RN, BSN Entered By: [name] on 06/04/2022 13:39:58 -------------------------------------------------------------------------------- Debridement Details [name], [name] 06/04/2022 1:00 PM Medical Record Number: [id number] Patient Account Number: [id number] Date of Birth/Sex: Treating RN: 10/05/1955 (66 y.o. [name] Primary Care Provider: [name] Other Clinician:  Referring Provider: Treating Provider/Extender: [name] in Treatment: 34 Debridement Performed for Assessment: Wound #29 Right,Lateral Lower Leg Performed By: Physician [name], DO Debridement Type: Debridement Severity of Tissue Pre Debridement: Fat layer exposed Level of Consciousness (Pre-procedure): Awake and Alert Pre-procedure Verification/Time Out Yes - 13:30 Taken: Start Time: 13:31 Pain Control: Lidocaine 5% topical ointment T Area Debrided (L x W): otal 1.3 (cm) x 1.4 (cm) = 1.82 (cm) Tissue and other material debrided: Viable, Non-Viable, Slough, Subcutaneous, Skin: Dermis , Skin: Epidermis, Slough Level: Skin/Subcutaneous Tissue Debridement Description: Excisional Instrument: Curette Bleeding: Moderate Hemostasis Achieved: Pressure End Time: 13:40 Procedural Pain: 0 Post Procedural Pain: 0 Response to Treatment: Procedure was tolerated well Level of Consciousness (Post- Awake and Alert procedure): Post Debridement Measurements of Total Wound Length: (cm) 1.3 Width: (cm) 1.4 Depth: (cm) 0.2 Volume: (cm) 0.286 Character of Wound/Ulcer Post  Debridement: Improved Severity of Tissue Post Debridement: Fat layer exposed Post Procedure Diagnosis Same as Pre-procedure Electronic Signature(s) Signed: 06/04/2022 2:17:28 PM By: [name] DO Signed: 06/04/2022 5:16:45 PM By: [name] RN, BSN Entered By: [name] on 06/04/2022 13:40:20 -------------------------------------------------------------------------------- HPI Details [name], [name] 06/04/2022 1:00 PM Medical Record Number: [id number] Patient Account Number: [id number] Date of Birth/Sex: Treating RN: [birth year] (66 y.o. [name] Primary Care Provider: [name] Other Clinician: Referring Provider: Treating Provider/Extender: [name] in Treatment: 49 History of Present Illness HPI Description: 07/07/15; this is a patient who is in hospital on 8/2 through 8/4. He had cellulitis and abscess of predominantly I think the left leg. He received IV antibiotics. Plain x-ray showed no osteomyelitis. An MRI of the left leg did not show osteomyelitis. Cultures showed no predominant organism. His hemoglobin A1c was 9.8. He has a history of venous stasis also peripheral vascular disease. He was discharged to [place] home. He really has extensive ulcerations on the left lateral leg including a major wound that communicates both posteriorly and superiorly w. When drainage coming out of 2 smaller areas. He has a smaller wound on the posterior medial left leg. He has more predominantly venous insufficiency wounds on predominantly the right medial leg above the ankle the right foot into sections. He has recently been put on Augmentin at the nursing home. Previous ABIs/arterial evaluation showed triphasic waves diffusely he does not have a major ischemic issue a left lower extremity venous duplex also exam showed no evidence of a DVT on 6/21 07/21/15; the patient arrives with really no major  change. Culture I did last time was negative. He has not had a follow-up MRI I ordered. The wounds are macerated covered with a thick gelatinous surface slough. There is necrotic subcutaneous tissue 08/04/15; the patient arrives with a considerable improvement in the majority of his wound area. The area on the left leg now has what looks to be a granulated base. Most of the wounds on the left leg required an aggressive surgical debridement to remove nonviable fibrinous eschar and subcutaneous tissue however after debridement most of this looks better. Although I had asked for repeat MRI of the left leg when he first came in here with grossly purulent material coming out of his wounds, it does not appear that this is been done and nor do I actually feel that strongly about it right now. He has severe surrounding venous insufficiency and inflammation. I don't believe he has significant PAD 08/25/15. In general there is still a considerable  wound area here but with still extensive service adherent slough. The patient will not allow mechanical debridement due to pain. He did not tolerate Medihoney therefore we are left with Santyl for now. She would appear that he has severe surrounding venous stasis. There is no evidence of the infection that may have had something to do with the pathogenesis of these wounds 09/29/15; patient still has substantial wound area on the left leg with a cluster of several wounds on the lateral leg confluently on the left leg posteriorly and then a substantial wound on the medial leg. On the right leg a substantial wound medially and a small area on the right lateral foot. All of these underwent a substantial surgical debridement with curettes which she tolerated better than he has in the past. This started as a complex cellulitis in the face of chronic venous insufficiency and inflammation. 10/13/15; substantial cluster of wounds on the lateral aspect of his left leg confluently  around to the other side. Extensive surgical debridement to remove for redness surface slough nonviable subcutaneous tissue. This is not an improvement. Also substantial wound on the medial right leg which is largely unchanged. The etiology of this was felt to be a complex cellulitis in the summer of 2016 in the face of chronic venous insufficiency and inflammation. The patient states that the wound on the right medial leg has been there for years off and on. 10/20/15; we are able to start [facility] to these extensive wound areas left greater than right on Tuesday after I spoke to the wound care nurse at the facility. He arrives here today for extensive surgical debridement for the wounds on the lateral aspect of his left leg posterior left leg, this is almost circumferential. He has a large wound on the medial aspect of his right leg although he finds this too painful for debridement 10/27/15; I continue to bring this patient back for frequent debridement/weekly debridement severe. We have been using Hydrofera Blue. Unfortunately this has not really had any improvement. The debridement surgery difficult and painful for the patient 11/23/15; this patient spent a complex hospitalization admitted with acute kidney injury, anemia, cellulitis of the lower extremity, he was felt to have sepsis pathophysiology although his blood cultures were negative. He had plain x-rays of both legs that were negative for osseous abnormalities. He was seen by infectious disease and placed on a workup for vasculitis that was negative including a biopsy 4 all were consistent with stasis dermatitis. Vital broad- spectrum antibiotics he continued to spike fevers. Urine and chest x-ray were negative Dopplers on admission ruled out a DVT All antibiotics were stopped on . 2/17. Since his return to [facility] skilled nursing facility I believe they have been using Xeroform 12/07/15 the patient arrives today with the area on his  right medial leg actually looking quite stable. No debridement. The rest of his extensive wounds on the left lateral left posterior extending into the left medial leg all required extensive debridement. I think this is probably going to need to and up in the hands of plastic surgery. We'll attempt to change him back to [facility] Arrange consultation with plastic surgery at [facility] for this almost circumferential wound on the left side 12/21/15 right leg covered in surface slough. This was debridement. Left leg extensive wounds all carefully examined. This is almost circumferential on the left side especially on the posterior calf. No debridement is necessary. 01/04/16 the patient has been to [facility] plastic surgery.  Unfortunately it doesn't look like they had any of the prior workup on this patient. They're in the middle of vascular workup. It sounds as though they're applying Hydrofera Blue at the nursing home. 01/22/16; the patient has been back to see [facility]. There apparently making plans to possibly do skin grafts over his large venous insufficiency ulcerations. The patient tells me that he goes to hematology in [facility] and variably uses the term platelets red cells and the description of his problem. 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 I do not see any major difference in the wound on the right medial leg and circumferentially across the left medial to left lateral leg. Did not attempt to debride these today. Readmission: 05/05/18 on evaluation today patient presents for reevaluation here in our clinic although I have previously seen him in the skilled nursing facility over the past year and a half when I was working in the skilled nursing facility realm instead of covering in the clinic. With that being said during that time we had initiated most recently dressings with Hydrofera Blue Dressing along with the [employer] which had  done very well for him. Much better than the Kerlex Coban wraps. With that being said the patient had been doing fairly well and was making progress when I last saw him. Upon evaluation today it appears that the wounds are actually a little bit worse than when I last saw him although definitely not dramatically so. There does not appear to be any evidence of infection which is good news. With that being said he has been tolerating the wraps without complication his left hip still gives him a lot of trouble which is his main issue as far as movement is concerned. Unbeknownst to me he has not had venous studies that I can find. He previously told me he had there were arterial studies noted back from 04/27/15 which showed that he had try phasic blood flow throughout and again his test appear to be completely normal as performed by Dr. Creig Hines. With that being said I cannot find where he had venous studies. The patient still states he thinks he did these definitely had no venous intervention at this point. Upon evaluation today it does not appear to me that the patient has any evidence of cellulitis. He does have some chronic venous insufficiency which I think has led to stasis dermatitis but again this is not appearing to be infected at this point. No fevers, chills, nausea, or vomiting noted at this time. READMISSION 06/30/2018 This is a now 66 year old man we have had in this clinic at multiple times in the past. Most recently he came in August and was seen by [facility] stone. It is not clear why he did not come back we asked him really did not get a straight answer. He is at [facility] skilled facility he is a man who has severe chronic venous insufficiency with lymphedema and has had severe wounds on his left greater than right leg. We had him here in 2016 and 17 ultimately referred him to [facility]. He had arterial studies and venous studies done at [facility] although I am not able to access these  records I see they are actually done. He also had multiple biopsies that were negative for malignancy showing changes compatible with chronic inflammation. As I understand things in [place] they are using Iodoflex and Unna boots. I am not really sure  how they are getting enough Iodoflex for the large wound area especially on his left calf. Nevertheless the wounds look better than when I saw this in 2017. There were plans for him to have plastic surgery in 2018 and I think he was actually prepped for surgery however he was ultimately denied because the patient was an active smoker. The patient is not systemically unwell. The wounds are painful however given the size especially on the left this is not surprising. ABIs in this clinic were 0.78 on the left and 0.91 on the right 07/13/2018; this is a patient with bilateral severe venous ulcers with secondary lymphedema. The wounds are on the right medial calf and a substantial part of the left posterior calf and some involvement medially and laterally on the left. We changed him to silver alginate under Unna boot's last time. The wounds actually look fairly satisfactory today. 08/14/2018; this is a patient with severe bilateral venous stasis ulcers with secondary lymphedema left greater than right he is at [facility] using silver alginate under Unna boot's I do not think there is much change on this visit versus last time I saw him a month ago Readmission: 11/04/18 patient presents today for follow-up concerning his bilateral lower extremity ulcers. His a right medial ankle ulcer and a left leg that has ulcers over a large proportion of the surface area between the ankle and knee. Unfortunately this does cause some discomfort for the patient although it doesn't seem to be as uncomfortable as it has been in the past. He is seen today for evaluation after a referral by [place] back to the clinic. Unfortunately has a lot of Slough noted on the surface of  his wounds. He's been treated currently it sounds like with Dakin's soaked gauze and they have not been performing the Unna Boot wrap that was previously recommended. I'm not exactly sure what the reason for the change was. He does have less slough on the surface of the wound but again this is something that is often a constant issue for him. Again he's had these wounds for many years. I've known him for two years at least during that time when I was taking care of them in the facility he is Artie had the wounds for several years prior. READMISSION 02/25/2020 Josepha Pigg is a now 66 year old man. He has been in our clinic several times before most extensively in October 2016 through May 2017. At that time he had absolutely substantial bilateral lower extremity wounds secondary to chronic venous insufficiency and lymphedema. We ultimately referred him to [facility] he had a series of biopsies only showed suggestion of wound secondary to chronic venous insufficiency. He had a less extensive stay in our clinic in 2019 and then a single visit in February 2020. He is a resident at [facility] skilled facility. I think he has had intermittently had in facility wound care. He returns today. They are using silver alginate on the wounds although I am not sure what type of compression. Previously he has favored Unna boots. He is not felt to have an arterial issue. Past medical history; lymphedema and chronic venous insufficiency, diabetes mellitus, hypertension, congestive heart failure We did not do arterial studies today 04/27/2020 on evaluation patient appears to be doing really about the same as when I have known him and seen him in the past. He does have wounds on the right and left legs at this point. Fortunately there is no signs of active infection at  this time. 9/2; 1 month follow-up. This is a man with severe chronic venous insufficiency and lymphedema. When I first met him he had horrible circumferential  bilateral lower extremity ulcers. We have been using silver alginate up until early last month when it was changed to [facility] and Unna boot's more or less says maintenance dressings. Since the last time he was here the facility where he resides [facility] called to report they could no longer do Unna boot so he is apparently only been receiving kerlix Coban the left leg is certainly a lot worse with almost circumferential large wounds especially lateral but now medial and posterior as well. He has a standard same looking wound on the right medial lower leg. 10/1; absolutely no improvement here. In fact I do not think anything much is changed. The substantial area on his left lateral and left posterior calf is still open may be slightly larger. He has a more modest wound on the right medial calf. None of these look any different. He comes in with a kerlix Coban wrap this will simply not be adequate. He has too much edema in this leg He also is complaining a lot of pain in his left heel area. 11/8; potential wounds on the left lateral and posterior calf and a smaller oval wound in the right medial. We have been using Hydrofera Blue under 4-layer compression. He was using Unna boots still 2 months ago the facility called to say they can no longer place these so we have been using 4-layer compression. The surface area of the area on the left is so large there is very few alternatives to what we can use on this either silver alginate or Hydrofera Blue. He was complaining of pain on the left heel last time I asked for an x-ray this was apparently done although we do not have the result. He is not complaining of pain today. 08/16/2020 on evaluation today patient is is seen for a early visit due to the fact that he apparently is having issues I was told when they called on Monday with a MRSA infection that they felt like we needed to see him for because his legs "looked horrible". With that being  said based on what I see at this point it does not appear that the patient is actually having a terrible infection in fact compared to last time I saw him his legs do not appear to be doing too poorly at all at this time. Nonetheless he has been on doxycycline that is not going to be a good option for him based on the culture report that I have for review today which graded as a partial report with still several organisms pending as far as identification is concerned we did contact them but again they do not have the final report yet. Either way based on what we see it appears that doxycycline is one of the few medications that will not work for the Citrobacter. Obviously I think that a different medication would be a good option for him since there is a gram-negative organism pending I am likely can I suggest Cipro as the best of backup antibiotic to switch him to at this point. All this was discussed with the patient today. 12/20; not much change in the substantial wound on the left posterior calf and the small area on the right medial. He has a new area on the left anterior which looks like a wrap injury. Had some thoughts  about doing a deep tissue culture here although we will put that off for next time. Using silver alginate as a primary dressing 2/10; substantial area on the left posterior calf. This measures smaller but still is a very large area. He has the area on the right anterior medial which also is a fairly large wound but much smaller than not on the left. His edema control is quite good. We have been using silver alginate because of the size of the wounds. 4/7; substantial wound on the left posterior calf and a smaller area on the right medial lower leg. These are very chronic wounds which we have classified is venous. Previously worked up at [facility] for 5 years ago at which time I had sent and will send him over for consideration of extensive skin graft I know they biopsied this many  times but he never had plastic surgery. The wound area is much too large for standard size dressings we have been using silver alginate but we did give him a good trial last fall of Hydrofera Blue that did not make any difference either. If anything the area on the right medial lower leg over time has gotten a lot smaller as has the area on the left although it still quite substantial now. Readmission 10/08/2021 Mr. Moustapha Tooker is a 66 year old male with a past medical history of insulin-dependent type 2 diabetes with last hemoglobin A1c of 10.2, chronic venous insufficiency, lymphedema and chronic diastolic heart failure that presents to the clinic for bilateral lower extremity wounds. He has been followed in our clinic previously for the past 6 years for these issues. He was last seen in April 2022. He is inconsistent with his appointments in our clinic. These wounds have not healed over the past few years. He is currently using silver alginate to the wound beds and keeping these covered with Kerlix. He currently denies systemic signs of infection. He was recently hospitalized for septic shock secondary to bacteremia from likely chronic leg wounds And is currently on oral antibiotics to be completed this week. 1/30; patient presents for follow-up. He has no issues or complaints today. 2/6; patient presents for follow-up. He reports more discomfort with the increase in compression wraps. He currently denies systemic signs of infection and feels well overall. 2/13; patient presents for follow-up. He reports tolerating the current leg/Coban wrap well. He denies systemic signs of infection and feels fine overall. 2/21; patient presents for follow-up. He has no issues or complaints today. He tolerated the kirlex/Coban wrap. He denies systemic signs of infection. 3/21; patient presents for follow-up. He has no issues or complaints today. He saw infectious disease, Dr. Megan Salon. Nothing further to do from  an infectious disease point. Levaquin was stopped. He currently denies systemic signs of infection. 4/4; patient presents for follow-up. His facility was able to obtain the [facility] antibiotic and has started using this with dressing changes/wrap changes. Patient has no issues or complaints today. He denies signs of infection. 4/18; patient presents for follow-up. He has no issues or complaints today. He denies signs of infection. 5/9; patient presents for follow-up. He has been using Keystone antibiotic under Kerlix/Coban. He denies signs of infection today. 6/6; monthly follow-up. Patient is at [facility] skilled facility. He has been using Keystone probably silver alginate under kerlix Coban. We use Sorbact here. Wound condition has improved 7/11; patient presents for follow-up. We have been using Keystone with Sorbact under Kerlix/Coban. Patient has no issues or complaints today. 8/8; patient  presents for follow-up. He is still using Keystone and Sorbact under Kerlix/Coban to his lower extremities bilaterally. He has no issues or complaints today. He denies signs of infection. 9/12; patient presents for follow-up. We have been using Keystone with Sorbact and calcium alginate under Kerlix/Coban to lower extremities bilaterally. He has no issues or complaints today. Electronic Signature(s) Signed: 06/04/2022 2:17:28 PM By: [name] DO Entered By: [name] on 06/04/2022 13:44:19 -------------------------------------------------------------------------------- Physical Exam Details [name], [name] 06/04/2022 1:00 PM Medical Record Number: [id number] Patient Account Number: [id number] Date of Birth/Sex: Treating RN: 1955/11/30 (66 y.o. [name] Primary Care Provider: [name] Other Clinician: Referring Provider: Treating Provider/Extender: [name] in Treatment: 34 Constitutional respirations regular,  non-labored and within target range for patient.. Cardiovascular 2+ dorsalis pedis/posterior tibialis pulses. Psychiatric pleasant and cooperative. Notes Bilateral lower extremity wounds with granulation tissue and nonviable tissue, left > right. No signs of surrounding infection. Evidence of lymphedema skin changes bilaterally to the lower extremities. Electronic Signature(s) Signed: 06/04/2022 2:17:28 PM By: [name] DO Entered By: [name] on 06/04/2022 13:44:50 -------------------------------------------------------------------------------- Physician Orders Details [name], [name] 06/04/2022 1:00 PM Medical Record Number: [id number] Patient Account Number: [id number] Date of Birth/Sex: Treating RN: 1955/12/11 (66 y.o. [name] Primary Care Provider: [name] Other Clinician: Referring Provider: Treating Provider/Extender: [name] in Treatment: 32 Verbal / Phone Orders: No Diagnosis Coding ICD-10 Coding Code Description [phone] Chronic venous hypertension (idiopathic) with ulcer and inflammation of bilateral lower extremity 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ed L97.912 Non-pressure chronic ulcer of unspecified part of right lower leg with fat layer exposed I89.0 Lymphedema, not elsewhere classified Follow-up Appointments Return appointment in 1 month. - 4 weeks Dr. Heber [place] w/ [facility]ay[facility] Room #9 NEED STRETCHER ROOM ****EXTRA TIME 75 MINS. HOYEr/HIGH ACUITY*** No appt.'s after 11:00 and after 3:00!!**** ****PLEASE WRAP LEGS BEGINNING AT TOES AND GOING TO BEND OF KNEE*** Other: - Pt. to bring in [place] to every [facility] appt. please!!:) Bathing/ Shower/ Hygiene May shower and wash wound with soap and water. - when changing dressing Edema Control - Lymphedema / SCD / Other Elevate legs to the level of the heart or above for 30 minutes daily and/or  when sitting, a frequency of: - throughout the day Moisturize legs daily. - with dressing changes Additional Orders / Instructions Follow Nutritious Diet - Monitor/Control Blood Sugars-Continue using ProStat Other: - Go to emergency room for fever, chills, strong odor from wounds, increased pain in wounds Wound Treatment Wound #25 - Lower Leg Wound Laterality: Right, Medial Cleanser: Soap and Water 3 x Per Week/30 Days Discharge Instructions: May shower and wash wound with dial antibacterial soap and water prior to dressing change. Cleanser: Wound Cleanser 3 x Per Week/30 Days Discharge Instructions: Cleanse the wound with wound cleanser prior to applying a clean dressing using gauze sponges, not tissue or cotton balls. Peri-Wound Care: Zinc Oxide Ointment 30g tube 3 x Per Week/30 Days Discharge Instructions: Apply Zinc Oxide to periwound with each dressing change Peri-Wound Care: Sween Lotion (Moisturizing lotion) 3 x Per Week/30 Days Discharge Instructions: Apply moisturizing lotion as directed Prim Dressing: Maxorb Extra Calcium Alginate Dressing, 4x4 in 3 x Per Week/30 Days ary Discharge Instructions: Apply calcium alginate over the cutimed to help absorb drainage. Prim Dressing: Cutimed Sorbact Swab 3 x Per Week/30 Days ary Discharge Instructions: Apply to  wound bed as instructed Prim Dressing: Keystone Antibiotic spray ary 3 x Per Week/30 Days Secondary Dressing: ABD Pad, 8x10 3 x Per Week/30 Days Discharge Instructions: Apply over primary dressing as directed. Compression Wrap: Kerlix Roll 4.5x3.1 (in/yd) 3 x Per Week/30 Days Discharge Instructions: Apply Kerlix and Coban compression as directed. Compression Wrap: Coban Self-Adherent Wrap 4x5 (in/yd) 3 x Per Week/30 Days Discharge Instructions: Apply over Kerlix as directed. Wound #27 - Lower Leg Wound Laterality: Left, Posterior Cleanser: Soap and Water 3 x Per Week/30 Days Discharge Instructions: May shower and wash wound with  dial antibacterial soap and water prior to dressing change. Cleanser: Wound Cleanser 3 x Per Week/30 Days Discharge Instructions: Cleanse the wound with wound cleanser prior to applying a clean dressing using gauze sponges, not tissue or cotton balls. Peri-Wound Care: Zinc Oxide Ointment 30g tube 3 x Per Week/30 Days Discharge Instructions: Apply Zinc Oxide to periwound with each dressing change Peri-Wound Care: Sween Lotion (Moisturizing lotion) 3 x Per Week/30 Days Discharge Instructions: Apply moisturizing lotion as directed Prim Dressing: Maxorb Extra Calcium Alginate Dressing, 4x4 in 3 x Per Week/30 Days ary Discharge Instructions: Apply calcium alginate over the cutimed to help absorb drainage. Prim Dressing: Cutimed Sorbact Swab 3 x Per Week/30 Days ary Discharge Instructions: Apply to wound bed as instructed Prim Dressing: Keystone Antibiotic spray ary 3 x Per Week/30 Days Secondary Dressing: ABD Pad, 8x10 3 x Per Week/30 Days Discharge Instructions: Apply over primary dressing as directed. Compression Wrap: Kerlix Roll 4.5x3.1 (in/yd) 3 x Per Week/30 Days Discharge Instructions: Apply Kerlix and Coban compression as directed. Compression Wrap: Coban Self-Adherent Wrap 4x5 (in/yd) 3 x Per Week/30 Days Discharge Instructions: Apply over Kerlix as directed. Wound #29 - Lower Leg Wound Laterality: Right, Lateral Cleanser: Soap and Water 3 x Per Week/30 Days Discharge Instructions: May shower and wash wound with dial antibacterial soap and water prior to dressing change. Cleanser: Wound Cleanser 3 x Per Week/30 Days Discharge Instructions: Cleanse the wound with wound cleanser prior to applying a clean dressing using gauze sponges, not tissue or cotton balls. Peri-Wound Care: Zinc Oxide Ointment 30g tube 3 x Per Week/30 Days Discharge Instructions: Apply Zinc Oxide to periwound with each dressing change Peri-Wound Care: Sween Lotion (Moisturizing lotion) 3 x Per Week/30  Days Discharge Instructions: Apply moisturizing lotion as directed Prim Dressing: Maxorb Extra Calcium Alginate Dressing, 4x4 in 3 x Per Week/30 Days ary Discharge Instructions: Apply calcium alginate over the cutimed to help absorb drainage. Prim Dressing: Cutimed Sorbact Swab 3 x Per Week/30 Days ary Discharge Instructions: Apply to wound bed as instructed Prim Dressing: Keystone Antibiotic spray ary 3 x Per Week/30 Days Secondary Dressing: ABD Pad, 8x10 3 x Per Week/30 Days Discharge Instructions: Apply over primary dressing as directed. Compression Wrap: Kerlix Roll 4.5x3.1 (in/yd) 3 x Per Week/30 Days Discharge Instructions: Apply Kerlix and Coban compression as directed. Compression Wrap: Coban Self-Adherent Wrap 4x5 (in/yd) 3 x Per Week/30 Days Discharge Instructions: Apply over Kerlix as directed. Electronic Signature(s) Signed: 06/04/2022 2:17:28 PM By: [name] DO Entered By: [name] on 06/04/2022 13:45:07 -------------------------------------------------------------------------------- Problem List Details [name], [name] 06/04/2022 1:00 PM Medical Record Number: [id number] Patient Account Number: [id number] Date of Birth/Sex: Treating RN: May 09, 1956 (66 y.o. [name] Primary Care Provider: [name] Other Clinician: Referring Provider: Treating Provider/Extender: [name] in Treatment: 25 Active Problems ICD-10 Encounter Code Description Active Date MDM Diagnosis I87.333 Chronic venous hypertension (idiopathic)  with ulcer and inflammation of 10/08/2021 No Yes bilateral lower extremity 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ed1/16/2023 No Yes L97.912 Non-pressure chronic ulcer of unspecified part of right lower leg with fat layer 10/08/2021 No Yes exposed I89.0 Lymphedema, not elsewhere classified 10/08/2021 No Yes E44.0 Moderate  protein-calorie malnutrition 06/04/2022 No Yes E11.622 Type 2 diabetes mellitus with other skin ulcer 06/04/2022 No Yes W09.81 Chronic diastolic (congestive) heart failure 06/04/2022 No Yes Inactive Problems Resolved Problems Electronic Signature(s) Signed: 06/04/2022 2:17:28 PM By: [name] DO Entered By: [name] on 06/04/2022 13:54:41 -------------------------------------------------------------------------------- Progress Note Details [name], [name] 06/04/2022 1:00 PM Medical Record Number: [id number] Patient Account Number: [id number] Date of Birth/Sex: Treating RN: September 21, 1956 (66 y.o. [name] Primary Care Provider: [name] Other Clinician: Referring Provider: Treating Provider/Extender: [name] in Treatment: 69 Subjective Chief Complaint Information obtained from Patient 10/08/2021; bilateral lower extremity wounds History of Present Illness (HPI) 07/07/15; this is a patient who is in hospital on 8/2 through 8/4. He had cellulitis and abscess of predominantly I think the left leg. He received IV antibiotics. Plain x-ray showed no osteomyelitis. An MRI of the left leg did not show osteomyelitis. Cultures showed no predominant organism. His hemoglobin A1c was 9.8. He has a history of venous stasis also peripheral vascular disease. He was discharged to [facility]. He really has extensive ulcerations on the left lateral leg including a major wound that communicates both posteriorly and superiorly w. When drainage coming out of 2 smaller areas. He has a smaller wound on the posterior medial left leg. He has more predominantly venous insufficiency wounds on predominantly the right medial leg above the ankle the right foot into sections. He has recently been put on Augmentin at the nursing home. Previous ABIs/arterial evaluation showed triphasic waves diffusely he does not have a major  ischemic issue a left lower extremity venous duplex also exam showed no evidence of a DVT on 6/21 07/21/15; the patient arrives with really no major change. Culture I did last time was negative. He has not had a follow-up MRI I ordered. The wounds are macerated covered with a thick gelatinous surface slough. There is necrotic subcutaneous tissue 08/04/15; the patient arrives with a considerable improvement in the majority of his wound area. The area on the left leg now has what looks to be a granulated base. Most of the wounds on the left leg required an aggressive surgical debridement to remove nonviable fibrinous eschar and subcutaneous tissue however after debridement most of this looks better. Although I had asked for repeat MRI of the left leg when he first came in here with grossly purulent material coming out of his wounds, it does not appear that this is been done and nor do I actually feel that strongly about it right now. He has severe surrounding venous insufficiency and inflammation. I don't believe he has significant PAD 08/25/15. In general there is still a considerable wound area here but with still extensive service adherent slough. The patient will not allow mechanical debridement due to pain. He did not tolerate Medihoney therefore we are left with Santyl for now. She would appear that he has severe surrounding venous stasis. There is no evidence of the infection that may have had something to do with the pathogenesis of these wounds 09/29/15; patient still has substantial wound area on the left leg with a cluster of  several wounds on the lateral leg confluently on the left leg posteriorly and then a substantial wound on the medial leg. On the right leg a substantial wound medially and a small area on the right lateral foot. All of these underwent a substantial surgical debridement with curettes which she tolerated better than he has in the past. This started as a complex cellulitis in  the face of chronic venous insufficiency and inflammation. 10/13/15; substantial cluster of wounds on the lateral aspect of his left leg confluently around to the other side. Extensive surgical debridement to remove for redness surface slough nonviable subcutaneous tissue. This is not an improvement. Also substantial wound on the medial right leg which is largely unchanged. The etiology of this was felt to be a complex cellulitis in the summer of 2016 in the face of chronic venous insufficiency and inflammation. The patient states that the wound on the right medial leg has been there for years off and on. 10/20/15; we are able to start [facility] to these extensive wound areas left greater than right on Tuesday after I spoke to the wound care nurse at the facility. He arrives here today for extensive surgical debridement for the wounds on the lateral aspect of his left leg posterior left leg, this is almost circumferential. He has a large wound on the medial aspect of his right leg although he finds this too painful for debridement 10/27/15; I continue to bring this patient back for frequent debridement/weekly debridement severe. We have been using Hydrofera Blue. Unfortunately this has not really had any improvement. The debridement surgery difficult and painful for the patient 11/23/15; this patient spent a complex hospitalization admitted with acute kidney injury, anemia, cellulitis of the lower extremity, he was felt to have sepsis pathophysiology although his blood cultures were negative. He had plain x-rays of both legs that were negative for osseous abnormalities. He was seen by infectious disease and placed on a workup for vasculitis that was negative including a biopsy o4 all were consistent with stasis dermatitis. Vital broad-spectrum antibiotics he continued to spike fevers. Urine and chest x-ray were negative Dopplers on admission ruled out a DVT All antibiotics were stopped on  2/17. . Since his return to [facility] skilled nursing facility I believe they have been using Xeroform 12/07/15 the patient arrives today with the area on his right medial leg actually looking quite stable. No debridement. The rest of his extensive wounds on the left lateral left posterior extending into the left medial leg all required extensive debridement. I think this is probably going to need to and up in the hands of plastic surgery. We'll attempt to change him back to [facility]. Arrange consultation with plastic surgery at [facility] for this almost circumferential wound on the left side 12/21/15 right leg covered in surface slough. This was debridement. Left leg extensive wounds all carefully examined. This is almost circumferential on the left side especially on the posterior calf. No debridement is necessary. 01/04/16 the patient has been to [facility] plastic surgery. Unfortunately it doesn't look like they had any of the prior workup on this patient. They're in the middle of vascular workup. It sounds as though they're applying Hydrofera Blue at the nursing home. 01/22/16; the patient has been back to see [facility]. There apparently making plans to possibly do skin grafts over his large venous insufficiency ulcerations. The patient tells me that he goes to hematology in [facility] and variably uses the term platelets red cells and  the description of his problem. 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 I do not see any major difference in the wound on the right medial leg and circumferentially across the left medial to left lateral leg. Did not attempt to debride these today. Readmission: 05/05/18 on evaluation today patient presents for reevaluation here in our clinic although I have previously seen him in the skilled nursing facility over the past year and a half when I was working in the skilled nursing facility realm instead of covering in  the clinic. With that being said during that time we had initiated most recently dressings with Hydrofera Blue Dressing along with the [employer] which had done very well for him. Much better than the Kerlex Coban wraps. With that being said the patient had been doing fairly well and was making progress when I last saw him. Upon evaluation today it appears that the wounds are actually a little bit worse than when I last saw him although definitely not dramatically so. There does not appear to be any evidence of infection which is good news. With that being said he has been tolerating the wraps without complication his left hip still gives him a lot of trouble which is his main issue as far as movement is concerned. Unbeknownst to me he has not had venous studies that I can find. He previously told me he had there were arterial studies noted back from 04/27/15 which showed that he had try phasic blood flow throughout and again his test appear to be completely normal as performed by Dr. Creig Hines. With that being said I cannot find where he had venous studies. The patient still states he thinks he did these definitely had no venous intervention at this point. Upon evaluation today it does not appear to me that the patient has any evidence of cellulitis. He does have some chronic venous insufficiency which I think has led to stasis dermatitis but again this is not appearing to be infected at this point. No fevers, chills, nausea, or vomiting noted at this time. READMISSION 06/30/2018 This is a now 66 year old man we have had in this clinic at multiple times in the past. Most recently he came in August and was seen by [facility] stone. It is not clear why he did not come back we asked him really did not get a straight answer. He is at [facility] skilled facility he is a man who has severe chronic venous insufficiency with lymphedema and has had severe wounds on his left greater than right leg. We had him  here in 2016 and 17 ultimately referred him to [facility]. He had arterial studies and venous studies done at [facility] although I am not able to access these records I see they are actually done. He also had multiple biopsies that were negative for malignancy showing changes compatible with chronic inflammation. As I understand things in [place] they are using Iodoflex and Unna boots. I am not really sure how they are getting enough Iodoflex for the large wound area especially on his left calf. Nevertheless the wounds look better than when I saw this in 2017. There were plans for him to have plastic surgery in 2018 and I think he was actually prepped for surgery however he was ultimately denied because the patient was an active smoker. The patient is not systemically unwell. The wounds are painful however given the size especially on the left this  is not surprising. ABIs in this clinic were 0.78 on the left and 0.91 on the right 07/13/2018; this is a patient with bilateral severe venous ulcers with secondary lymphedema. The wounds are on the right medial calf and a substantial part of the left posterior calf and some involvement medially and laterally on the left. We changed him to silver alginate under Unna boot's last time. The wounds actually look fairly satisfactory today. 08/14/2018; this is a patient with severe bilateral venous stasis ulcers with secondary lymphedema left greater than right he is at [facility] using silver alginate under Unna boot's I do not think there is much change on this visit versus last time I saw him a month ago Readmission: 11/04/18 patient presents today for follow-up concerning his bilateral lower extremity ulcers. His a right medial ankle ulcer and a left leg that has ulcers over a large proportion of the surface area between the ankle and knee. Unfortunately this does cause some discomfort for the patient although it doesn't seem to be as uncomfortable as  it has been in the past. He is seen today for evaluation after a referral by [place] back to the clinic. Unfortunately has a lot of Slough noted on the surface of his wounds. He's been treated currently it sounds like with Dakin's soaked gauze and they have not been performing the Unna Boot wrap that was previously recommended. I'm not exactly sure what the reason for the change was. He does have less slough on the surface of the wound but again this is something that is often a constant issue for him. Again he's had these wounds for many years. I've known him for two years at least during that time when I was taking care of them in the facility he is Artie had the wounds for several years prior. READMISSION 02/25/2020 Josepha Pigg is a now 66 year old man. He has been in our clinic several times before most extensively in October 2016 through May 2017. At that time he had absolutely substantial bilateral lower extremity wounds secondary to chronic venous insufficiency and lymphedema. We ultimately referred him to [facility] he had a series of biopsies only showed suggestion of wound secondary to chronic venous insufficiency. He had a less extensive stay in our clinic in 2019 and then a single visit in February 2020. He is a resident at [facility] skilled facility. I think he has had intermittently had in facility wound care. He returns today. They are using silver alginate on the wounds although I am not sure what type of compression. Previously he has favored Unna boots. He is not felt to have an arterial issue. Past medical history; lymphedema and chronic venous insufficiency, diabetes mellitus, hypertension, congestive heart failure We did not do arterial studies today 04/27/2020 on evaluation patient appears to be doing really about the same as when I have known him and seen him in the past. He does have wounds on the right and left legs at this point. Fortunately there is no signs of active infection  at this time. 9/2; 1 month follow-up. This is a man with severe chronic venous insufficiency and lymphedema. When I first met him he had horrible circumferential bilateral lower extremity ulcers. We have been using silver alginate up until early last month when it was changed to [facility] and Unna boot's more or less says maintenance dressings. Since the last time he was here the facility where he resides [facility] called to report they could no longer  do Unna boot so he is apparently only been receiving kerlix Coban the left leg is certainly a lot worse with almost circumferential large wounds especially lateral but now medial and posterior as well. He has a standard same looking wound on the right medial lower leg. 10/1; absolutely no improvement here. In fact I do not think anything much is changed. The substantial area on his left lateral and left posterior calf is still open may be slightly larger. He has a more modest wound on the right medial calf. None of these look any different. He comes in with a kerlix Coban wrap this will simply not be adequate. He has too much edema in this leg He also is complaining a lot of pain in his left heel area. 11/8; potential wounds on the left lateral and posterior calf and a smaller oval wound in the right medial. We have been using Hydrofera Blue under 4-layer compression. He was using Unna boots still 2 months ago the facility called to say they can no longer place these so we have been using 4-layer compression. The surface area of the area on the left is so large there is very few alternatives to what we can use on this either silver alginate or Hydrofera Blue. He was complaining of pain on the left heel last time I asked for an x-ray this was apparently done although we do not have the result. He is not complaining of pain today. 08/16/2020 on evaluation today patient is is seen for a early visit due to the fact that he apparently is having  issues I was told when they called on Monday with a MRSA infection that they felt like we needed to see him for because his legs "looked horrible". With that being said based on what I see at this point it does not appear that the patient is actually having a terrible infection in fact compared to last time I saw him his legs do not appear to be doing too poorly at all at this time. Nonetheless he has been on doxycycline that is not going to be a good option for him based on the culture report that I have for review today which graded as a partial report with still several organisms pending as far as identification is concerned we did contact them but again they do not have the final report yet. Either way based on what we see it appears that doxycycline is one of the few medications that will not work for the Citrobacter. Obviously I think that a different medication would be a good option for him since there is a gram-negative organism pending I am likely can I suggest Cipro as the best of backup antibiotic to switch him to at this point. All this was discussed with the patient today. 12/20; not much change in the substantial wound on the left posterior calf and the small area on the right medial. He has a new area on the left anterior which looks like a wrap injury. Had some thoughts about doing a deep tissue culture here although we will put that off for next time. Using silver alginate as a primary dressing 2/10; substantial area on the left posterior calf. This measures smaller but still is a very large area. He has the area on the right anterior medial which also is a fairly large wound but much smaller than not on the left. His edema control is quite good. We have been using silver alginate because of  the size of the wounds. 4/7; substantial wound on the left posterior calf and a smaller area on the right medial lower leg. These are very chronic wounds which we have classified is venous.  Previously worked up at [facility] for 5 years ago at which time I had sent and will send him over for consideration of extensive skin graft I know they biopsied this many times but he never had plastic surgery. The wound area is much too large for standard size dressings we have been using silver alginate but we did give him a good trial last fall of Hydrofera Blue that did not make any difference either. If anything the area on the right medial lower leg over time has gotten a lot smaller as has the area on the left although it still quite substantial now. Readmission 10/08/2021 Mr. Moxon Messler is a 66 year old male with a past medical history of insulin-dependent type 2 diabetes with last hemoglobin A1c of 10.2, chronic venous insufficiency, lymphedema and chronic diastolic heart failure that presents to the clinic for bilateral lower extremity wounds. He has been followed in our clinic previously for the past 6 years for these issues. He was last seen in April 2022. He is inconsistent with his appointments in our clinic. These wounds have not healed over the past few years. He is currently using silver alginate to the wound beds and keeping these covered with Kerlix. He currently denies systemic signs of infection. He was recently hospitalized for septic shock secondary to bacteremia from likely chronic leg wounds And is currently on oral antibiotics to be completed this week. 1/30; patient presents for follow-up. He has no issues or complaints today. 2/6; patient presents for follow-up. He reports more discomfort with the increase in compression wraps. He currently denies systemic signs of infection and feels well overall. 2/13; patient presents for follow-up. He reports tolerating the current leg/Coban wrap well. He denies systemic signs of infection and feels fine overall. 2/21; patient presents for follow-up. He has no issues or complaints today. He tolerated the kirlex/Coban wrap. He denies  systemic signs of infection. 3/21; patient presents for follow-up. He has no issues or complaints today. He saw infectious disease, Dr. Megan Salon. Nothing further to do from an infectious disease point. Levaquin was stopped. He currently denies systemic signs of infection. 4/4; patient presents for follow-up. His facility was able to obtain the [facility] antibiotic and has started using this with dressing changes/wrap changes. Patient has no issues or complaints today. He denies signs of infection. 4/18; patient presents for follow-up. He has no issues or complaints today. He denies signs of infection. 5/9; patient presents for follow-up. He has been using Keystone antibiotic under Kerlix/Coban. He denies signs of infection today. 6/6; monthly follow-up. Patient is at [facility] skilled facility. He has been using Keystone probably silver alginate under kerlix Coban. We use Sorbact here. Wound condition has improved 7/11; patient presents for follow-up. We have been using Keystone with Sorbact under Kerlix/Coban. Patient has no issues or complaints today. 8/8; patient presents for follow-up. He is still using Keystone and Sorbact under Kerlix/Coban to his lower extremities bilaterally. He has no issues or complaints today. He denies signs of infection. 9/12; patient presents for follow-up. We have been using Keystone with Sorbact and calcium alginate under Kerlix/Coban to lower extremities bilaterally. He has no issues or complaints today. Patient History Information obtained from Patient. Family History Diabetes - Mother,Father,Siblings, Hypertension - Mother,Father, No family history of Cancer, Heart Disease, Hereditary  Spherocytosis, Kidney Disease, Lung Disease, Seizures, Stroke, Thyroid Problems, Tuberculosis. Social History Current every day smoker - 4 cig per day, Marital Status - Separated, Alcohol Use - Never, Drug Use - Prior History - cocaine, [place] quit 2005, Caffeine Use  - Moderate. Medical History Eyes Denies history of Cataracts, Glaucoma, Optic Neuritis Ear/Nose/Mouth/Throat Denies history of Chronic sinus problems/congestion, Middle ear problems Hematologic/Lymphatic Patient has history of Anemia - iron deficiency Denies history of Hemophilia, Human Immunodeficiency Virus, Lymphedema, Sickle Cell Disease Respiratory Patient has history of Sleep Apnea Denies history of Aspiration, Asthma, Chronic Obstructive Pulmonary Disease (COPD), Pneumothorax, Tuberculosis Cardiovascular Patient has history of Congestive Heart Failure, Coronary Artery Disease, Hypertension, Peripheral Venous Disease Denies history of Angina, Arrhythmia, Deep Vein Thrombosis, Hypotension, Myocardial Infarction, Peripheral Arterial Disease, Phlebitis, Vasculitis Gastrointestinal Denies history of Cirrhosis , Colitis, Crohnoos, Hepatitis A, Hepatitis B, Hepatitis C Endocrine Patient has history of Type II Diabetes - uncontrolled Denies history of Type I Diabetes Genitourinary Denies history of End Stage Renal Disease Immunological Denies history of Lupus Erythematosus, Raynaudoos, Scleroderma Integumentary (Skin) Denies history of History of Burn Musculoskeletal Patient has history of Osteoarthritis Denies history of Gout, Rheumatoid Arthritis, Osteomyelitis Neurologic Patient has history of Neuropathy Denies history of Dementia, Quadriplegia, Paraplegia, Seizure Disorder Oncologic Denies history of Received Chemotherapy, Received Radiation Psychiatric Denies history of Anorexia/bulimia, Confinement Anxiety Hospitalization/Surgery History - inguinal hernia repair with mesh. - right shoulder rotator cuff repair. - toe nail excision. - Sepsis 09/03/21-09/10/22. Medical A Surgical History Notes nd Gastrointestinal GERD Musculoskeletal degenerative righ thip Psychiatric depression Objective Constitutional respirations regular, non-labored and within target range for  patient.. Vitals Time Taken: 12:57 PM, Height: 77 in, Weight: 258 lbs, BMI: 30.6, Temperature: 98.2 F, Pulse: 69 bpm, Respiratory Rate: 18 breaths/min, Blood Pressure: 120/70 mmHg. Cardiovascular 2+ dorsalis pedis/posterior tibialis pulses. Psychiatric pleasant and cooperative. General Notes: Bilateral lower extremity wounds with granulation tissue and nonviable tissue, left > right. No signs of surrounding infection. Evidence of lymphedema skin changes bilaterally to the lower extremities. Integumentary (Hair, Skin) Wound #25 status is Open. Original cause of wound was Gradually Appeared. The date acquired was: 09/07/2021. The wound has been in treatment 34 weeks. The wound is located on the Right,Medial Lower Leg. The wound measures 5.7cm length x 2.4cm width x 0.2cm depth; 10.744cm^2 area and 2.149cm^3 volume. There is Fat Layer (Subcutaneous Tissue) exposed. There is a medium amount of serosanguineous drainage noted. The wound margin is thickened. There is large (67-100%) red, pink granulation within the wound bed. There is no necrotic tissue within the wound bed. Wound #27 status is Open. Original cause of wound was Gradually Appeared. The date acquired was: 09/07/2021. The wound has been in treatment 34 weeks. The wound is located on the Left,Posterior Lower Leg. The wound measures 17cm length x 15cm width x 0.3cm depth; 200.277cm^2 area and 60.083cm^3 volume. There is Fat Layer (Subcutaneous Tissue) exposed. There is no tunneling or undermining noted. There is a medium amount of serosanguineous drainage noted. The wound margin is distinct with the outline attached to the wound base. There is large (67-100%) red, friable granulation within the wound bed. There is no necrotic tissue within the wound bed. Wound #29 status is Open. Original cause of wound was Gradually Appeared. The date acquired was: 04/30/2022. The wound has been in treatment 5 weeks. The wound is located on the  Right,Lateral Lower Leg. The wound measures 1.3cm length x 1.4cm width x 0.2cm depth; 1.429cm^2 area and 0.286cm^3 volume. There is Fat Layer (Subcutaneous  Tissue) exposed. There is no tunneling or undermining noted. There is a medium amount of serosanguineous drainage noted. The wound margin is distinct with the outline attached to the wound base. There is medium (34-66%) red, pink granulation within the wound bed. There is a medium (34-66%) amount of necrotic tissue within the wound bed including Adherent Slough. Assessment Active Problems ICD-10 Chronic venous hypertension (idiopathic) with ulcer and inflammation of bilateral lower extremity Non-pressure chronic ulcer of other part of left lower leg with fat layer exposed Non-pressure chronic ulcer of unspecified part of right lower leg with fat layer exposed Lymphedema, not elsewhere classified Moderate protein-calorie malnutrition Type 2 diabetes mellitus with other skin ulcer Chronic diastolic (congestive) heart failure Patient's wounds are stable. No signs of infection on exam. I debrided nonviable tissue. I recommended continuing the course with Keystone, Sorbact and calcium alginate under Kerlix/Coban. He resides in a facility he does weekly wrap changes. Follow-up in 1 month. Procedures Wound #25 Pre-procedure diagnosis of Wound #25 is a Venous Leg Ulcer located on the Right,Medial Lower Leg .Severity of Tissue Pre Debridement is: Fat layer exposed. There was a Excisional Skin/Subcutaneous Tissue Debridement with a total area of 13.68 sq cm performed by [name], DO. With the following instrument(s): Curette to remove Viable and Non-Viable tissue/material. Material removed includes Subcutaneous Tissue, Slough, Skin: Dermis, and Skin: Epidermis after achieving pain control using Lidocaine 5% topical ointment. A time out was conducted at 13:30, prior to the start of the procedure. A Moderate amount of bleeding was controlled  with Pressure. The procedure was tolerated well with a pain level of 0 throughout and a pain level of 0 following the procedure. Post Debridement Measurements: 5.7cm length x 2.4cm width x 0.2cm depth; 2.149cm^3 volume. Character of Wound/Ulcer Post Debridement is improved. Severity of Tissue Post Debridement is: Fat layer exposed. Post procedure Diagnosis Wound #25: Same as Pre-Procedure Wound #27 Pre-procedure diagnosis of Wound #27 is a Venous Leg Ulcer located on the Left,Posterior Lower Leg .Severity of Tissue Pre Debridement is: Fat layer exposed. There was a Excisional Skin/Subcutaneous Tissue Debridement with a total area of 255 sq cm performed by [name], DO. With the following instrument(s): Curette to remove Viable and Non-Viable tissue/material. Material removed includes Subcutaneous Tissue, Slough, Skin: Dermis, and Skin: Epidermis after achieving pain control using Lidocaine 5% topical ointment. A time out was conducted at 13:30, prior to the start of the procedure. A Moderate amount of bleeding was controlled with Pressure. The procedure was tolerated well with a pain level of 0 throughout and a pain level of 0 following the procedure. Post Debridement Measurements: 17cm length x 15cm width x 0.3cm depth; 60.083cm^3 volume. Character of Wound/Ulcer Post Debridement is improved. Severity of Tissue Post Debridement is: Fat layer exposed. Post procedure Diagnosis Wound #27: Same as Pre-Procedure Wound #29 Pre-procedure diagnosis of Wound #29 is a Venous Leg Ulcer located on the Right,Lateral Lower Leg .Severity of Tissue Pre Debridement is: Fat layer exposed. There was a Excisional Skin/Subcutaneous Tissue Debridement with a total area of 1.82 sq cm performed by [name], DO. With the following instrument(s): Curette to remove Viable and Non-Viable tissue/material. Material removed includes Subcutaneous Tissue, Slough, Skin: Dermis, and Skin: Epidermis after achieving  pain control using Lidocaine 5% topical ointment. A time out was conducted at 13:30, prior to the start of the procedure. A Moderate amount of bleeding was controlled with Pressure. The procedure was tolerated well with a pain level of 0 throughout and a pain  level of 0 following the procedure. Post Debridement Measurements: 1.3cm length x 1.4cm width x 0.2cm depth; 0.286cm^3 volume. Character of Wound/Ulcer Post Debridement is improved. Severity of Tissue Post Debridement is: Fat layer exposed. Post procedure Diagnosis Wound #29: Same as Pre-Procedure Plan Follow-up Appointments: Return appointment in 1 month. - 4 weeks Dr. Heber Weston w/ [facility] Room #9 NEED STRETCHER ROOM ****EXTRA TIME 75 MINS. HOYEr/HIGH ACUITY*** No appt.'s after 11:00 and after 3:00!!**** ****PLEASE WRAP LEGS BEGINNING AT TOES AND GOING TO BEND OF KNEE*** Other: - Pt. to bring in [place] to every [facility] appt. please!!:) Bathing/ Shower/ Hygiene: May shower and wash wound with soap and water. - when changing dressing Edema Control - Lymphedema / SCD / Other: Elevate legs to the level of the heart or above for 30 minutes daily and/or when sitting, a frequency of: - throughout the day Moisturize legs daily. - with dressing changes Additional Orders / Instructions: Follow Nutritious Diet - Monitor/Control Blood Sugars-Continue using ProStat Other: - Go to emergency room for fever, chills, strong odor from wounds, increased pain in wounds WOUND #25: - Lower Leg Wound Laterality: Right, Medial Cleanser: Soap and Water 3 x Per Week/30 Days Discharge Instructions: May shower and wash wound with dial antibacterial soap and water prior to dressing change. Cleanser: Wound Cleanser 3 x Per Week/30 Days Discharge Instructions: Cleanse the wound with wound cleanser prior to applying a clean dressing using gauze sponges, not tissue or cotton balls. Peri-Wound Care: Zinc Oxide Ointment 30g tube 3 x Per Week/30 Days Discharge Instructions:  Apply Zinc Oxide to periwound with each dressing change Peri-Wound Care: Sween Lotion (Moisturizing lotion) 3 x Per Week/30 Days Discharge Instructions: Apply moisturizing lotion as directed Prim Dressing: Maxorb Extra Calcium Alginate Dressing, 4x4 in 3 x Per Week/30 Days ary Discharge Instructions: Apply calcium alginate over the cutimed to help absorb drainage. Prim Dressing: Cutimed Sorbact Swab 3 x Per Week/30 Days ary Discharge Instructions: Apply to wound bed as instructed Prim Dressing: Keystone Antibiotic spray 3 x Per Week/30 Days ary Secondary Dressing: ABD Pad, 8x10 3 x Per Week/30 Days Discharge Instructions: Apply over primary dressing as directed. Com pression Wrap: Kerlix Roll 4.5x3.1 (in/yd) 3 x Per Week/30 Days Discharge Instructions: Apply Kerlix and Coban compression as directed. Com pression Wrap: Coban Self-Adherent Wrap 4x5 (in/yd) 3 x Per Week/30 Days Discharge Instructions: Apply over Kerlix as directed. WOUND #27: - Lower Leg Wound Laterality: Left, Posterior Cleanser: Soap and Water 3 x Per Week/30 Days Discharge Instructions: May shower and wash wound with dial antibacterial soap and water prior to dressing change. Cleanser: Wound Cleanser 3 x Per Week/30 Days Discharge Instructions: Cleanse the wound with wound cleanser prior to applying a clean dressing using gauze sponges, not tissue or cotton balls. Peri-Wound Care: Zinc Oxide Ointment 30g tube 3 x Per Week/30 Days Discharge Instructions: Apply Zinc Oxide to periwound with each dressing change Peri-Wound Care: Sween Lotion (Moisturizing lotion) 3 x Per Week/30 Days Discharge Instructions: Apply moisturizing lotion as directed Prim Dressing: Maxorb Extra Calcium Alginate Dressing, 4x4 in 3 x Per Week/30 Days ary Discharge Instructions: Apply calcium alginate over the cutimed to help absorb drainage. Prim Dressing: Cutimed Sorbact Swab 3 x Per Week/30 Days ary Discharge Instructions: Apply to wound bed as  instructed Prim Dressing: Keystone Antibiotic spray 3 x Per Week/30 Days ary Secondary Dressing: ABD Pad, 8x10 3 x Per Week/30 Days Discharge Instructions: Apply over primary dressing as directed. Com pression Wrap: Kerlix Roll 4.5x3.1 (in/yd) 3 x Per  Week/30 Days Discharge Instructions: Apply Kerlix and Coban compression as directed. Com pression Wrap: Coban Self-Adherent Wrap 4x5 (in/yd) 3 x Per Week/30 Days Discharge Instructions: Apply over Kerlix as directed. WOUND #29: - Lower Leg Wound Laterality: Right, Lateral Cleanser: Soap and Water 3 x Per Week/30 Days Discharge Instructions: May shower and wash wound with dial antibacterial soap and water prior to dressing change. Cleanser: Wound Cleanser 3 x Per Week/30 Days Discharge Instructions: Cleanse the wound with wound cleanser prior to applying a clean dressing using gauze sponges, not tissue or cotton balls. Peri-Wound Care: Zinc Oxide Ointment 30g tube 3 x Per Week/30 Days Discharge Instructions: Apply Zinc Oxide to periwound with each dressing change Peri-Wound Care: Sween Lotion (Moisturizing lotion) 3 x Per Week/30 Days Discharge Instructions: Apply moisturizing lotion as directed Prim Dressing: Maxorb Extra Calcium Alginate Dressing, 4x4 in 3 x Per Week/30 Days ary Discharge Instructions: Apply calcium alginate over the cutimed to help absorb drainage. Prim Dressing: Cutimed Sorbact Swab 3 x Per Week/30 Days ary Discharge Instructions: Apply to wound bed as instructed Prim Dressing: Keystone Antibiotic spray 3 x Per Week/30 Days ary Secondary Dressing: ABD Pad, 8x10 3 x Per Week/30 Days Discharge Instructions: Apply over primary dressing as directed. Com pression Wrap: Kerlix Roll 4.5x3.1 (in/yd) 3 x Per Week/30 Days Discharge Instructions: Apply Kerlix and Coban compression as directed. Com pression Wrap: Coban Self-Adherent Wrap 4x5 (in/yd) 3 x Per Week/30 Days Discharge Instructions: Apply over Kerlix as directed. 1. In  office sharp debridement 2. Keystone antibiotic with Sorbact and calcium alginate under Kerlix/Coban to lower extremities bilaterally. 3. Follow-up in 1 month Electronic Signature(s) Signed: 06/04/2022 2:17:28 PM By: [name] DO Entered By: [name] on 06/04/2022 13:57:58 -------------------------------------------------------------------------------- HxROS Details [name], [name] 06/04/2022 1:00 PM Medical Record Number: [id number] Patient Account Number: [id number] Date of Birth/Sex: Treating RN: 05-Sep-1956 (67 y.o. [name] Primary Care Provider: [name] Other Clinician: Referring Provider: Treating Provider/Extender: [name] in Treatment: 65 Information Obtained From Patient Eyes Medical History: Negative for: Cataracts; Glaucoma; Optic Neuritis Ear/Nose/Mouth/Throat Medical History: Negative for: Chronic sinus problems/congestion; Middle ear problems Hematologic/Lymphatic Medical History: Positive for: Anemia - iron deficiency Negative for: Hemophilia; Human Immunodeficiency Virus; Lymphedema; Sickle Cell Disease Respiratory Medical History: Positive for: Sleep Apnea Negative for: Aspiration; Asthma; Chronic Obstructive Pulmonary Disease (COPD); Pneumothorax; Tuberculosis Cardiovascular Medical History: Positive for: Congestive Heart Failure; Coronary Artery Disease; Hypertension; Peripheral Venous Disease Negative for: Angina; Arrhythmia; Deep Vein Thrombosis; Hypotension; Myocardial Infarction; Peripheral Arterial Disease; Phlebitis; Vasculitis Gastrointestinal Medical History: Negative for: Cirrhosis ; Colitis; Crohns; Hepatitis A; Hepatitis B; Hepatitis C Past Medical History Notes: GERD Endocrine Medical History: Positive for: Type II Diabetes - uncontrolled Negative for: Type I Diabetes Time with diabetes: 1999 Treated with: Insulin Blood sugar tested every day: Yes  T ested : 3x per day Blood sugar testing results: Breakfast: 81-200; Lunch: 180-210; Dinner: 200 Genitourinary Medical History: Negative for: End Stage Renal Disease Immunological Medical History: Negative for: Lupus Erythematosus; Raynauds; Scleroderma Integumentary (Skin) Medical History: Negative for: History of Burn Musculoskeletal Medical History: Positive for: Osteoarthritis Negative for: Gout; Rheumatoid Arthritis; Osteomyelitis Past Medical History Notes: degenerative righ thip Neurologic Medical History: Positive for: Neuropathy Negative for: Dementia; Quadriplegia; Paraplegia; Seizure Disorder Oncologic Medical History: Negative for: Received Chemotherapy; Received Radiation Psychiatric Medical History: Negative for: Anorexia/bulimia; Confinement Anxiety Past Medical History Notes: depression Immunizations Pneumococcal Vaccine: Received Pneumococcal Vaccination: Yes Received Pneumococcal Vaccination On or After 60th Birthday:  No Implantable Devices None Hospitalization / Surgery History Type of Hospitalization/Surgery inguinal hernia repair with mesh right shoulder rotator cuff repair toe nail excision Sepsis 09/03/21-09/10/22 Family and Social History Cancer: No; Diabetes: Yes - Mother,Father,Siblings; Heart Disease: No; Hereditary Spherocytosis: No; Hypertension: Yes - Mother,Father; Kidney Disease: No; Lung Disease: No; Seizures: No; Stroke: No; Thyroid Problems: No; Tuberculosis: No; Current every day smoker - 4 cig per day; Marital Status - Separated; Alcohol Use: Never; Drug Use: Prior History - cocaine, [place] quit 2005; Caffeine Use: Moderate; Financial Concerns: No; Food, Clothing or Shelter Needs: No; Support System Lacking: No; Transportation Concerns: No Electronic Signature(s) Signed: 06/04/2022 2:17:28 PM By: [name] DO Signed: 06/06/2022 4:08:15 PM By: Rhae Hammock RN Entered By: [name] on 06/04/2022  13:44:24 -------------------------------------------------------------------------------- SuperBill Details [name], [name] 06/04/2022 Medical Record Number: [id number] Patient Account Number: [id number] Date of Birth/Sex: Treating RN: 1956/08/19 (66 y.o. 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 L97.912 Non-pressure chronic ulcer of unspecified part of right lower leg with fat layer exposed I89.0 Lymphedema, not elsewhere classified E44.0 Moderate protein-calorie malnutrition E11.622 Type 2 diabetes mellitus with other skin ulcer T55.73 Chronic diastolic (congestive) heart failure Facility Procedures CPT4 Code: [id number] Description: 11042 - DEB SUBQ TISSUE 20 SQ CM/< ICD-10 Diagnosis Description 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 L97.912 Non-pressure chronic ulcer of unspecified part of right lower leg with fat laye Modifier: ed r exposed Quantity: 1 CPT4 Code: [id number] Description: 31517 - DEB SUBQ TISS EA ADDL 20CM ICD-10 Diagnosis Description 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 L97.912 Non-pressure chronic ulcer of unspecified part of right lower leg with fat laye Modifier: ed r exposed Quantity: 13 Physician Procedures : CPT4 Code Description Modifier [id number] 10626 - WC PHYS SUBQ TISS 20 SQ CM ICD-10 Diagnosis Description 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ed L97.912 Non-pressure chronic ulcer of unspecified part of   right lower leg with fat layer exposed Quantity: 1 : [id number] - WC PHYS SUBQ TISS EA ADDL 20 CM ICD-10 Diagnosis Description 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ed L97.912 Non-pressure chronic ulcer of unspecified part of right lower leg with fat  layer exposed Quantity: 13 Electronic Signature(s) Signed: 06/04/2022 2:17:28 PM By: [name] DO Entered By: [name] on 06/04/2022 13:58:25

## 2022-07-02 ENCOUNTER — Encounter (HOSPITAL_BASED_OUTPATIENT_CLINIC_OR_DEPARTMENT_OTHER): Payer: Medicare Other | Attending: Internal Medicine | Admitting: Internal Medicine

## 2022-07-02 DIAGNOSIS — I87333 Chronic venous hypertension (idiopathic) with ulcer and inflammation of bilateral lower extremity: Secondary | ICD-10-CM | POA: Diagnosis not present

## 2022-07-02 DIAGNOSIS — Z6830 Body mass index (BMI) 30.0-30.9, adult: Secondary | ICD-10-CM | POA: Insufficient documentation

## 2022-07-02 DIAGNOSIS — I5032 Chronic diastolic (congestive) heart failure: Secondary | ICD-10-CM | POA: Insufficient documentation

## 2022-07-02 DIAGNOSIS — E440 Moderate protein-calorie malnutrition: Secondary | ICD-10-CM | POA: Diagnosis not present

## 2022-07-02 DIAGNOSIS — E11622 Type 2 diabetes mellitus with other skin ulcer: Secondary | ICD-10-CM | POA: Insufficient documentation

## 2022-07-02 DIAGNOSIS — I110 Hypertensive heart disease with heart failure: Secondary | ICD-10-CM | POA: Diagnosis not present

## 2022-07-02 DIAGNOSIS — I890 Lymphedema, not elsewhere classified: Secondary | ICD-10-CM | POA: Insufficient documentation

## 2022-07-02 DIAGNOSIS — L97912 Non-pressure chronic ulcer of unspecified part of right lower leg with fat layer exposed: Secondary | ICD-10-CM | POA: Insufficient documentation

## 2022-07-02 DIAGNOSIS — L97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ed: Secondary | ICD-10-CM | POA: Insufficient documentation

## 2022-07-02 NOTE — Progress Notes (Signed)
Acri, Lang ([id number]) [id number] Page 1 of 17 Visit Report for 07/02/2022 Chief Complaint Document Details [name], [name] 07/02/2022 12:45 PM Medical Record Number: [id number] Patient Account Number: [id number] Date of Birth/Sex: Treating RN: 04/28/1956 (66 y.o. [name] Primary Care Provider: [name] Other Clinician: Referring Provider: Treating Provider/Extender: [name] in Treatment: 94 Information Obtained from: Patient Chief Complaint 10/08/2021; bilateral lower extremity wounds Electronic Signature(s) Signed: 07/02/2022 4:32:14 PM By: [name] DO Entered By: [name] on 07/02/2022 13:27:23 -------------------------------------------------------------------------------- Debridement Details [name], [name] 07/02/2022 12:45 PM Medical Record Number: [id number] Patient Account Number: [id number] Date of Birth/Sex: Treating RN: 1956-06-17 (66 y.o. [name], Meta.Reding Primary Care Provider: [name] Other Clinician: Referring Provider: Treating Provider/Extender: [name] in Treatment: 38 Debridement Performed for Assessment: Wound #25 Right,Medial Lower Leg Performed By: Physician [name], DO Debridement Type: Debridement Severity of Tissue Pre Debridement: Fat layer exposed Level of Consciousness (Pre-procedure): Awake and Alert Pre-procedure Verification/Time Out Yes - 13:25 Taken: Start Time: 13:26 Pain Control: Lidocaine 4% T opical Solution T Area Debrided (L x W): otal 5.7 (cm) x 2.2 (cm) = 12.54 (cm) Tissue and other material debrided: Viable, Non-Viable, Slough, Subcutaneous, Biofilm, Slough Level: Skin/Subcutaneous Tissue Debridement Description: Excisional Instrument: Curette Bleeding: Minimum Hemostasis Achieved: Pressure End Time: 13:26 Procedural Pain: 0 Post  Procedural Pain: 0 Response to Treatment: Procedure was tolerated well Level of Consciousness (Post- Awake and Alert procedure): Post Debridement Measurements of Total Wound Length: (cm) 5.7 Width: (cm) 2.2 Depth: (cm) 0.2 Volume: (cm) 1.97 Character of Wound/Ulcer Post Debridement: Improved Severity of Tissue Post Debridement: Fat layer exposed Deshpande, Ranger ([id number]) [id number] Page 2 of 17 Post Procedure Diagnosis Same as Pre-procedure Electronic Signature(s) Signed: 07/02/2022 4:32:14 PM By: [name] DO Signed: 07/02/2022 6:12:04 PM By: [name] RN, BSN Entered By: [name] on 07/02/2022 13:26:46 -------------------------------------------------------------------------------- Debridement Details [name], [name] 07/02/2022 12:45 PM Medical Record Number: [id number] Patient Account Number: [id number] Date of Birth/Sex: Treating RN: 06/30/1956 (66 y.o. [name], Meta.Reding Primary Care Provider: [name] Other Clinician: Referring Provider: Treating Provider/Extender: [name] in Treatment: 38 Debridement Performed for Assessment: Wound #27 Left,Posterior Lower Leg Performed By: Physician [name], DO Debridement Type: Debridement Severity of Tissue Pre Debridement: Fat layer exposed Level of Consciousness (Pre-procedure): Awake and Alert Pre-procedure Verification/Time Out Yes - 13:25 Taken: Start Time: 13:26 Pain Control: Lidocaine 4% T opical Solution T Area Debrided (L x W): otal 14 (cm) x 10 (cm) = 140 (cm) Tissue and other material debrided: Viable, Non-Viable, Slough, Subcutaneous, Biofilm, Slough Level: Skin/Subcutaneous Tissue Debridement Description: Excisional Instrument: Curette Bleeding: Minimum Hemostasis Achieved: Pressure End Time: 13:26 Procedural Pain: 0 Post Procedural Pain: 0 Response to Treatment: Procedure was tolerated  well Level of Consciousness (Post- Awake and Alert procedure): Post Debridement Measurements of Total Wound Length: (cm) 14 Width: (cm) 15.8 Depth: (cm) 0.3 Volume: (cm) 52.119 Character of Wound/Ulcer Post Debridement: Improved Severity of Tissue Post Debridement: Fat layer exposed Post Procedure Diagnosis Same as Pre-procedure Electronic Signature(s) Signed: 07/02/2022 4:32:14 PM By: [name] DO Signed: 07/02/2022 6:12:04 PM By: [name] RN, BSN Entered By: [name] on 07/02/2022 13:27:25 Debridement Details -------------------------------------------------------------------------------- Flott, Brent ([id number]) [id number] Page 3 of 17 [name], [name] 07/02/2022 12:45 PM Medical Record Number: [id number] Patient Account Number: [id number] Date of Birth/Sex:  Treating RN: 1955-09-26 (66 y.o. [name], Meta.Reding Primary Care Provider: [name] Other Clinician: Referring Provider: Treating Provider/Extender: [name] in Treatment: 38 Debridement Performed for Assessment: Wound #29 Right,Lateral Lower Leg Performed By: Physician [name], DO Debridement Type: Debridement Severity of Tissue Pre Debridement: Fat layer exposed Level of Consciousness (Pre-procedure): Awake and Alert Pre-procedure Verification/Time Out Yes - 13:25 Taken: Start Time: 13:26 Pain Control: Lidocaine 4% T opical Solution T Area Debrided (L x W): otal 1.3 (cm) x 1.2 (cm) = 1.56 (cm) Tissue and other material debrided: Viable, Non-Viable, Slough, Subcutaneous, Biofilm, Slough Level: Skin/Subcutaneous Tissue Debridement Description: Excisional Instrument: Curette Bleeding: Minimum Hemostasis Achieved: Pressure End Time: 13:26 Procedural Pain: 0 Post Procedural Pain: 0 Response to Treatment: Procedure was tolerated well Level of Consciousness (Post- Awake and Alert procedure): Post  Debridement Measurements of Total Wound Length: (cm) 1.3 Width: (cm) 1.2 Depth: (cm) 0.3 Volume: (cm) 0.368 Character of Wound/Ulcer Post Debridement: Improved Severity of Tissue Post Debridement: Fat layer exposed Post Procedure Diagnosis Same as Pre-procedure Electronic Signature(s) Signed: 07/02/2022 4:32:14 PM By: [name] DO Signed: 07/02/2022 6:12:04 PM By: [name] RN, BSN Entered By: [name] on 07/02/2022 13:28:00 -------------------------------------------------------------------------------- HPI Details [name], [name] 07/02/2022 12:45 PM Medical Record Number: [id number] Patient Account Number: [id number] Date of Birth/Sex: Treating RN: 1956/07/07 (66 y.o. [name] Primary Care Provider: [name] Other Clinician: Referring Provider: Treating Provider/Extender: [name] in Treatment: 71 History of Present Illness HPI Description: 07/07/15; this is a patient who is in hospital on 8/2 through 8/4. He had cellulitis and abscess of predominantly I think the left leg. He received IV antibiotics. Plain x-ray showed no osteomyelitis. An MRI of the left leg did not show osteomyelitis. Cultures showed no predominant organism. His hemoglobin A1c was 9.8. He has a history of venous stasis also peripheral vascular disease. He was discharged to [place] home. He really has extensive ulcerations on the left lateral leg including a major wound that communicates both posteriorly and superiorly w. When drainage coming out of 2 smaller areas. He has a smaller wound on the posterior medial left leg. He has more predominantly venous insufficiency wounds on predominantly the right medial leg above the ankle the right foot into sections. He has recently been put on Augmentin at the nursing home. Previous ABIs/arterial evaluation showed triphasic waves diffusely he does not have a major ischemic  issue a left lower extremity venous duplex also exam showed no evidence of a DVT on 6/21 07/21/15; the patient arrives with really no major change. Culture I did last time was negative. He has not had a follow-up MRI I ordered. The wounds are macerated covered with a thick gelatinous surface slough. There is necrotic subcutaneous tissue 08/04/15; the patient arrives with a considerable improvement in the majority of his wound area. The area on the left leg now has what looks to be a granulated base. Most of the wounds on the left leg required an aggressive surgical debridement to remove nonviable fibrinous eschar and subcutaneous tissue however after debridement most of this looks better. Although I had asked for repeat MRI of the left leg when he first came in here with grossly purulent material Chaplin, Baldemar ([id number]) [id number] Page 4 of 17 coming out of his wounds, it does not appear that this is been done and nor do I actually feel that strongly about it right now. He has severe  surrounding venous insufficiency and inflammation. I don't believe he has significant PAD 08/25/15. In general there is still a considerable wound area here but with still extensive service adherent slough. The patient will not allow mechanical debridement due to pain. He did not tolerate Medihoney therefore we are left with Santyl for now. She would appear that he has severe surrounding venous stasis. There is no evidence of the infection that may have had something to do with the pathogenesis of these wounds 09/29/15; patient still has substantial wound area on the left leg with a cluster of several wounds on the lateral leg confluently on the left leg posteriorly and then a substantial wound on the medial leg. On the right leg a substantial wound medially and a small area on the right lateral foot. All of these underwent a substantial surgical debridement with curettes which she tolerated  better than he has in the past. This started as a complex cellulitis in the face of chronic venous insufficiency and inflammation. 10/13/15; substantial cluster of wounds on the lateral aspect of his left leg confluently around to the other side. Extensive surgical debridement to remove for redness surface slough nonviable subcutaneous tissue. This is not an improvement. Also substantial wound on the medial right leg which is largely unchanged. The etiology of this was felt to be a complex cellulitis in the summer of 2016 in the face of chronic venous insufficiency and inflammation. The patient states that the wound on the right medial leg has been there for years off and on. 10/20/15; we are able to start [facility] to these extensive wound areas left greater than right on Tuesday after I spoke to the wound care nurse at the facility. He arrives here today for extensive surgical debridement for the wounds on the lateral aspect of his left leg posterior left leg, this is almost circumferential. He has a large wound on the medial aspect of his right leg although he finds this too painful for debridement 10/27/15; I continue to bring this patient back for frequent debridement/weekly debridement severe. We have been using Hydrofera Blue. Unfortunately this has not really had any improvement. The debridement surgery difficult and painful for the patient 11/23/15; this patient spent a complex hospitalization admitted with acute kidney injury, anemia, cellulitis of the lower extremity, he was felt to have sepsis pathophysiology although his blood cultures were negative. He had plain x-rays of both legs that were negative for osseous abnormalities. He was seen by infectious disease and placed on a workup for vasculitis that was negative including a biopsy 4 all were consistent with stasis dermatitis. Vital broad- spectrum antibiotics he continued to spike fevers. Urine and chest x-ray were negative Dopplers on  admission ruled out a DVT All antibiotics were stopped on . 2/17. Since his return to [facility] skilled nursing facility I believe they have been using Xeroform 12/07/15 the patient arrives today with the area on his right medial leg actually looking quite stable. No debridement. The rest of his extensive wounds on the left lateral left posterior extending into the left medial leg all required extensive debridement. I think this is probably going to need to and up in the hands of plastic surgery. We'll attempt to change him back to [facility]. Arrange consultation with plastic surgery at [facility] for this almost circumferential wound on the left side 12/21/15 right leg covered in surface slough. This was debridement. Left leg extensive wounds all carefully examined. This is almost circumferential on the left  side especially on the posterior calf. No debridement is necessary. 01/04/16 the patient has been to [facility] plastic surgery. Unfortunately it doesn't look like they had any of the prior workup on this patient. They're in the middle of vascular workup. It sounds as though they're applying Hydrofera Blue at the nursing home. 01/22/16; the patient has been back to see [facility]. There apparently making plans to possibly do skin grafts over his large venous insufficiency ulcerations. The patient tells me that he goes to hematology in [facility] and variably uses the term platelets red cells and the description of his problem. 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 I do not see any major difference in the wound on the right medial leg and circumferentially across the left medial to left lateral leg. Did not attempt to debride these today. Readmission: 05/05/18 on evaluation today patient presents for reevaluation here in our clinic although I have previously seen him in the skilled nursing facility over the past year and a half when I was working in  the skilled nursing facility realm instead of covering in the clinic. With that being said during that time we had initiated most recently dressings with Hydrofera Blue Dressing along with the [employer] which had done very well for him. Much better than the Kerlex Coban wraps. With that being said the patient had been doing fairly well and was making progress when I last saw him. Upon evaluation today it appears that the wounds are actually a little bit worse than when I last saw him although definitely not dramatically so. There does not appear to be any evidence of infection which is good news. With that being said he has been tolerating the wraps without complication his left hip still gives him a lot of trouble which is his main issue as far as movement is concerned. Unbeknownst to me he has not had venous studies that I can find. He previously told me he had there were arterial studies noted back from 04/27/15 which showed that he had try phasic blood flow throughout and again his test appear to be completely normal as performed by Dr. Creig Hines. With that being said I cannot find where he had venous studies. The patient still states he thinks he did these definitely had no venous intervention at this point. Upon evaluation today it does not appear to me that the patient has any evidence of cellulitis. He does have some chronic venous insufficiency which I think has led to stasis dermatitis but again this is not appearing to be infected at this point. No fevers, chills, nausea, or vomiting noted at this time. READMISSION 06/30/2018 This is a now 66 year old man we have had in this clinic at multiple times in the past. Most recently he came in August and was seen by [facility] stone. It is not clear why he did not come back we asked him really did not get a straight answer. He is at [facility] skilled facility he is a man who has severe chronic venous insufficiency with lymphedema and has had severe  wounds on his left greater than right leg. We had him here in 2016 and 17 ultimately referred him to [facility]. He had arterial studies and venous studies done at [facility] although I am not able to access these records I see they are actually done. He also had multiple biopsies that were negative for malignancy showing changes compatible with chronic  inflammation. As I understand things in [place] they are using Iodoflex and Unna boots. I am not really sure how they are getting enough Iodoflex for the large wound area especially on his left calf. Nevertheless the wounds look better than when I saw this in 2017. There were plans for him to have plastic surgery in 2018 and I think he was actually prepped for surgery however he was ultimately denied because the patient was an active smoker. The patient is not systemically unwell. The wounds are painful however given the size especially on the left this is not surprising. ABIs in this clinic were 0.78 on the left and 0.91 on the right 07/13/2018; this is a patient with bilateral severe venous ulcers with secondary lymphedema. The wounds are on the right medial calf and a substantial part of the left posterior calf and some involvement medially and laterally on the left. We changed him to silver alginate under Unna boot's last time. The wounds actually look fairly satisfactory today. 08/14/2018; this is a patient with severe bilateral venous stasis ulcers with secondary lymphedema left greater than right he is at [facility] using silver alginate under Unna boot's I do not think there is much change on this visit versus last time I saw him a month ago Readmission: 11/04/18 patient presents today for follow-up concerning his bilateral lower extremity ulcers. His a right medial ankle ulcer and a left leg that has ulcers over a large proportion of the surface area between the ankle and knee. Unfortunately this does cause some discomfort for the  patient although it doesn't seem to be as uncomfortable as it has been in the past. He is seen today for evaluation after a referral by [place] back to the clinic. Unfortunately has a lot of Slough noted on the surface of his wounds. He's been treated currently it sounds like with Dakin's soaked gauze and they have not been performing the Unna Boot wrap that was previously recommended. I'm not exactly sure what the reason for the change was. He does have less slough on the surface of the wound but again this is something that is often a constant issue for him. Again he's had these wounds for many years. I've known him for two years at least during that time when I was taking care of them in the facility he is Artie had the wounds for several years prior. READMISSION 02/25/2020 Josepha Pigg is a now 66 year old man. He has been in our clinic several times before most extensively in October 2016 through May 2017. At that time he had absolutely substantial bilateral lower extremity wounds secondary to chronic venous insufficiency and lymphedema. We ultimately referred him to [facility] he had a series of biopsies only showed suggestion of wound secondary to chronic venous insufficiency. He had a less extensive stay in our clinic in 2019 and then a single visit in February 2020. He is a resident at [facility] skilled facility. I think he has had intermittently had in facility wound care. He returns today. They are using silver alginate on the wounds although I am not sure what type of compression. Previously he has favored Unna boots. He is not felt to have an arterial issue. Past medical history; lymphedema and chronic venous insufficiency, diabetes mellitus, hypertension, congestive heart failure Lacuesta, Kahlel ([id number]) [id number] Page 5 of 17 We did not do arterial studies today 04/27/2020 on evaluation patient appears to be doing really about the same as when I have known him  and seen him in the past. He does have wounds on the right and left legs at this point. Fortunately there is no signs of active infection at this time. 9/2; 1 month follow-up. This is a man with severe chronic venous insufficiency and lymphedema. When I first met him he had horrible circumferential bilateral lower extremity ulcers. We have been using silver alginate up until early last month when it was changed to [facility] and Unna boot's more or less says maintenance dressings. Since the last time he was here the facility where he resides [facility] called to report they could no longer do Unna boot so he is apparently only been receiving kerlix Coban the left leg is certainly a lot worse with almost circumferential large wounds especially lateral but now medial and posterior as well. He has a standard same looking wound on the right medial lower leg. 10/1; absolutely no improvement here. In fact I do not think anything much is changed. The substantial area on his left lateral and left posterior calf is still open may be slightly larger. He has a more modest wound on the right medial calf. None of these look any different. He comes in with a kerlix Coban wrap this will simply not be adequate. He has too much edema in this leg He also is complaining a lot of pain in his left heel area. 11/8; potential wounds on the left lateral and posterior calf and a smaller oval wound in the right medial. We have been using Hydrofera Blue under 4-layer compression. He was using Unna boots still 2 months ago the facility called to say they can no longer place these so we have been using 4-layer compression. The surface area of the area on the left is so large there is very few alternatives to what we can use on this either silver alginate or Hydrofera Blue. He was complaining of pain on the left heel last time I asked for an x-ray this was apparently done although we do not have the result. He is not  complaining of pain today. 08/16/2020 on evaluation today patient is is seen for a early visit due to the fact that he apparently is having issues I was told when they called on Monday with a MRSA infection that they felt like we needed to see him for because his legs "looked horrible". With that being said based on what I see at this point it does not appear that the patient is actually having a terrible infection in fact compared to last time I saw him his legs do not appear to be doing too poorly at all at this time. Nonetheless he has been on doxycycline that is not going to be a good option for him based on the culture report that I have for review today which graded as a partial report with still several organisms pending as far as identification is concerned we did contact them but again they do not have the final report yet. Either way based on what we see it appears that doxycycline is one of the few medications that will not work for the Citrobacter. Obviously I think that a different medication would be a good option for him since there is a gram-negative organism pending I am likely can I suggest Cipro as the best of backup antibiotic to switch him to at this point. All this was discussed with the patient today. 12/20; not much change in the substantial wound on the left posterior  calf and the small area on the right medial. He has a new area on the left anterior which looks like a wrap injury. Had some thoughts about doing a deep tissue culture here although we will put that off for next time. Using silver alginate as a primary dressing 2/10; substantial area on the left posterior calf. This measures smaller but still is a very large area. He has the area on the right anterior medial which also is a fairly large wound but much smaller than not on the left. His edema control is quite good. We have been using silver alginate because of the size of the wounds. 4/7; substantial wound on the  left posterior calf and a smaller area on the right medial lower leg. These are very chronic wounds which we have classified is venous. Previously worked up at [facility] for 5 years ago at which time I had sent and will send him over for consideration of extensive skin graft I know they biopsied this many times but he never had plastic surgery. The wound area is much too large for standard size dressings we have been using silver alginate but we did give him a good trial last fall of Hydrofera Blue that did not make any difference either. If anything the area on the right medial lower leg over time has gotten a lot smaller as has the area on the left although it still quite substantial now. Readmission 10/08/2021 Mr. Javarus Dorner is a 66 year old male with a past medical history of insulin-dependent type 2 diabetes with last hemoglobin A1c of 10.2, chronic venous insufficiency, lymphedema and chronic diastolic heart failure that presents to the clinic for bilateral lower extremity wounds. He has been followed in our clinic previously for the past 6 years for these issues. He was last seen in April 2022. He is inconsistent with his appointments in our clinic. These wounds have not healed over the past few years. He is currently using silver alginate to the wound beds and keeping these covered with Kerlix. He currently denies systemic signs of infection. He was recently hospitalized for septic shock secondary to bacteremia from likely chronic leg wounds And is currently on oral antibiotics to be completed this week. 1/30; patient presents for follow-up. He has no issues or complaints today. 2/6; patient presents for follow-up. He reports more discomfort with the increase in compression wraps. He currently denies systemic signs of infection and feels well overall. 2/13; patient presents for follow-up. He reports tolerating the current leg/Coban wrap well. He denies systemic signs of infection and feels fine  overall. 2/21; patient presents for follow-up. He has no issues or complaints today. He tolerated the kirlex/Coban wrap. He denies systemic signs of infection. 3/21; patient presents for follow-up. He has no issues or complaints today. He saw infectious disease, Dr. Megan Salon. Nothing further to do from an infectious disease point. Levaquin was stopped. He currently denies systemic signs of infection. 4/4; patient presents for follow-up. His facility was able to obtain the [facility] antibiotic and has started using this with dressing changes/wrap changes. Patient has no issues or complaints today. He denies signs of infection. 4/18; patient presents for follow-up. He has no issues or complaints today. He denies signs of infection. 5/9; patient presents for follow-up. He has been using Keystone antibiotic under Kerlix/Coban. He denies signs of infection today. 6/6; monthly follow-up. Patient is at [facility] skilled facility. He has been using Keystone probably silver alginate under kerlix Coban. We use Sorbact here.  Wound condition has improved 7/11; patient presents for follow-up. We have been using Keystone with Sorbact under Kerlix/Coban. Patient has no issues or complaints today. 8/8; patient presents for follow-up. He is still using Keystone and Sorbact under Kerlix/Coban to his lower extremities bilaterally. He has no issues or complaints today. He denies signs of infection. 9/12; patient presents for follow-up. We have been using Keystone with Sorbact and calcium alginate under Kerlix/Coban to lower extremities bilaterally. He has no issues or complaints today. 10/10; patient presents for follow-up. We continue to use [facility] with Sorbact and calcium alginate under Kerlix/Coban. He has tolerated this well and he has no issues or complaints today. Electronic Signature(s) Signed: 07/02/2022 4:32:14 PM By: [name] DO Entered By: [name] on 07/02/2022 13:27:46 Palazzola, Jacobb  ([id number]) [id number] Page 6 of 17 -------------------------------------------------------------------------------- Physical Exam Details [name], [name] 07/02/2022 12:45 PM Medical Record Number: [id number] Patient Account Number: [id number] Date of Birth/Sex: Treating RN: 1956-07-12 (66 y.o. [name] Primary Care Provider: [name] Other Clinician: Referring Provider: Treating Provider/Extender: [name] in Treatment: 54 Constitutional respirations regular, non-labored and within target range for patient.. Cardiovascular 2+ dorsalis pedis/posterior tibialis pulses. Psychiatric pleasant and cooperative. Notes Bilateral lower extremity wounds with granulation tissue and nonviable tissue, left > right. No signs of surrounding infection. Evidence of lymphedema skin changes bilaterally to the lower extremities. Electronic Signature(s) Signed: 07/02/2022 4:32:14 PM By: [name] DO Entered By: [name] on 07/02/2022 13:47:27 -------------------------------------------------------------------------------- Physician Orders Details [name], [name] 07/02/2022 12:45 PM Medical Record Number: [id number] Patient Account Number: [id number] Date of Birth/Sex: Treating RN: 03-15-1956 (66 y.o. [name] Primary Care Provider: [name] Other Clinician: Referring Provider: Treating Provider/Extender: [name] in Treatment: 67 Verbal / Phone Orders: No Diagnosis Coding Follow-up Appointments Return appointment in 1 month. - 4 weeks Dr. Heber Beulah w/ [facility] Room #9 NEED STRETCHER ROOM ****EXTRA TIME 75 MINS. HOYEr/HIGH ACUITY*** No appt.'s after 11:00 and after 3:00!!**** ****PLEASE WRAP LEGS BEGINNING AT TOES AND GOING TO BEND OF KNEE*** Other: - Pt. to bring in [place] to every [facility] appt.  please!!:) Anesthetic (In clinic) Topical Lidocaine 4% applied to wound bed Bathing/ Shower/ Hygiene May shower and wash wound with soap and water. - when changing dressing Edema Control - Lymphedema / SCD / Other Elevate legs to the level of the heart or above for 30 minutes daily and/or when sitting, a frequency of: - throughout the day Moisturize legs daily. - with dressing changes Additional Orders / Instructions Follow Nutritious Diet - Monitor/Control Blood Sugars-Continue using ProStat Other: - Go to emergency room for fever, chills, strong odor from wounds, increased pain in wounds Wound Treatment Rensch, Sophie ([id number]) [id number] Page 7 of 17 Wound #25 - Lower Leg Wound Laterality: Right, Medial Cleanser: Soap and Water 3 x Per Week/30 Days Discharge Instructions: May shower and wash wound with dial antibacterial soap and water prior to dressing change. Cleanser: Wound Cleanser 3 x Per Week/30 Days Discharge Instructions: Cleanse the wound with wound cleanser prior to applying a clean dressing using gauze sponges, not tissue or cotton balls. Peri-Wound Care: Zinc Oxide Ointment 30g tube 3 x Per Week/30 Days Discharge Instructions: Apply Zinc Oxide to periwound with each dressing change Peri-Wound Care: Sween Lotion (Moisturizing lotion) 3 x Per Week/30 Days Discharge Instructions: Apply moisturizing lotion as directed Prim Dressing: Maxorb Extra Calcium Alginate Dressing, 4x4 in 3 x  Per Week/30 Days ary Discharge Instructions: Apply calcium alginate over the cutimed to help absorb drainage. Prim Dressing: Cutimed Sorbact Swab 3 x Per Week/30 Days ary Discharge Instructions: Apply to wound bed as instructed Prim Dressing: Keystone Antibiotic spray ary 3 x Per Week/30 Days Secondary Dressing: ABD Pad, 8x10 3 x Per Week/30 Days Discharge Instructions: Apply over primary dressing as directed. Compression Wrap: Kerlix Roll 4.5x3.1 (in/yd) 3 x Per  Week/30 Days Discharge Instructions: Apply Kerlix and Coban compression as directed. Compression Wrap: Coban Self-Adherent Wrap 4x5 (in/yd) 3 x Per Week/30 Days Discharge Instructions: Apply over Kerlix as directed. Wound #27 - Lower Leg Wound Laterality: Left, Posterior Cleanser: Soap and Water 3 x Per Week/30 Days Discharge Instructions: May shower and wash wound with dial antibacterial soap and water prior to dressing change. Cleanser: Wound Cleanser 3 x Per Week/30 Days Discharge Instructions: Cleanse the wound with wound cleanser prior to applying a clean dressing using gauze sponges, not tissue or cotton balls. Peri-Wound Care: Zinc Oxide Ointment 30g tube 3 x Per Week/30 Days Discharge Instructions: Apply Zinc Oxide to periwound with each dressing change Peri-Wound Care: Sween Lotion (Moisturizing lotion) 3 x Per Week/30 Days Discharge Instructions: Apply moisturizing lotion as directed Prim Dressing: Maxorb Extra Calcium Alginate Dressing, 4x4 in 3 x Per Week/30 Days ary Discharge Instructions: Apply calcium alginate over the cutimed to help absorb drainage. Prim Dressing: Cutimed Sorbact Swab 3 x Per Week/30 Days ary Discharge Instructions: Apply to wound bed as instructed Prim Dressing: Keystone Antibiotic spray ary 3 x Per Week/30 Days Secondary Dressing: ABD Pad, 8x10 3 x Per Week/30 Days Discharge Instructions: Apply over primary dressing as directed. Compression Wrap: Kerlix Roll 4.5x3.1 (in/yd) 3 x Per Week/30 Days Discharge Instructions: Apply Kerlix and Coban compression as directed. Compression Wrap: Coban Self-Adherent Wrap 4x5 (in/yd) 3 x Per Week/30 Days Discharge Instructions: Apply over Kerlix as directed. Wound #29 - Lower Leg Wound Laterality: Right, Lateral Cleanser: Soap and Water 3 x Per Week/30 Days Discharge Instructions: May shower and wash wound with dial antibacterial soap and water prior to dressing change. Cleanser: Wound Cleanser 3 x Per Week/30  Days Discharge Instructions: Cleanse the wound with wound cleanser prior to applying a clean dressing using gauze sponges, not tissue or cotton balls. Peri-Wound Care: Zinc Oxide Ointment 30g tube 3 x Per Week/30 Days Discharge Instructions: Apply Zinc Oxide to periwound with each dressing change Peri-Wound Care: Sween Lotion (Moisturizing lotion) 3 x Per Week/30 Days Discharge Instructions: Apply moisturizing lotion as directed Prim Dressing: Maxorb Extra Calcium Alginate Dressing, 4x4 in 3 x Per Week/30 Days ary Discharge Instructions: Apply calcium alginate over the cutimed to help absorb drainage. Prim Dressing: Cutimed Sorbact Swab ary 3 x Per Week/30 Days Koo, Hussien ([id number]) [id number] Page 8 of 17 Discharge Instructions: Apply to wound bed as instructed Prim Dressing: Keystone Antibiotic spray ary 3 x Per Week/30 Days Secondary Dressing: ABD Pad, 8x10 3 x Per Week/30 Days Discharge Instructions: Apply over primary dressing as directed. Compression Wrap: Kerlix Roll 4.5x3.1 (in/yd) 3 x Per Week/30 Days Discharge Instructions: Apply Kerlix and Coban compression as directed. Compression Wrap: Coban Self-Adherent Wrap 4x5 (in/yd) 3 x Per Week/30 Days Discharge Instructions: Apply over Kerlix as directed. Patient Medications llergies: Sulfa (Sulfonamide Antibiotics) A Notifications Medication Indication Start End 07/02/2022 lidocaine DOSE topical 4 % cream - cream topical applied only in clinic for debridements. Electronic Signature(s) Signed: 07/02/2022 4:32:14 PM By: [name] DO Entered By: [name] on 07/02/2022 13:49:16 -------------------------------------------------------------------------------- Problem  List Details [name], [name] 07/02/2022 12:45 PM Medical Record Number: [id number] Patient Account Number: [id number] Date of Birth/Sex: Treating RN: 1956-06-23 (66 y.o. [name] Primary Care Provider: [name] Other Clinician: Referring Provider: Treating Provider/Extender: [name] in Treatment: 46 Active Problems ICD-10 Encounter Code Description Active Date MDM Diagnosis I87.333 Chronic venous hypertension (idiopathic) with ulcer and inflammation of 10/08/2021 No Yes bilateral lower extremity 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ed1/16/2023 No Yes L97.912 Non-pressure chronic ulcer of unspecified part of right lower leg with fat layer 10/08/2021 No Yes exposed I89.0 Lymphedema, not elsewhere classified 10/08/2021 No Yes E44.0 Moderate protein-calorie malnutrition 06/04/2022 No Yes E11.622 Type 2 diabetes mellitus with other skin ulcer 06/04/2022 No Yes Gibeault, Cleophus ([id number]) [id number] Page 9 of 17 T97.74 Chronic diastolic (congestive) heart failure 06/04/2022 No Yes Inactive Problems Resolved Problems Electronic Signature(s) Signed: 07/02/2022 4:32:14 PM By: [name] DO Entered By: [name] on 07/02/2022 13:27:11 -------------------------------------------------------------------------------- Progress Note Details [name], [name] 07/02/2022 12:45 PM Medical Record Number: [id number] Patient Account Number: [id number] Date of Birth/Sex: Treating RN: 1956-01-31 (66 y.o. [name] Primary Care Provider: [name] Other Clinician: Referring Provider: Treating Provider/Extender: [name] in Treatment: 38 Subjective Chief Complaint Information obtained from Patient 10/08/2021; bilateral lower extremity wounds History of Present Illness (HPI) 07/07/15; this is a patient who is in hospital on 8/2 through 8/4. He had cellulitis and abscess of predominantly I think the left leg. He received IV antibiotics. Plain x-ray showed no osteomyelitis. An MRI of the  left leg did not show osteomyelitis. Cultures showed no predominant organism. His hemoglobin A1c was 9.8. He has a history of venous stasis also peripheral vascular disease. He was discharged to [place] home. He really has extensive ulcerations on the left lateral leg including a major wound that communicates both posteriorly and superiorly w. When drainage coming out of 2 smaller areas. He has a smaller wound on the posterior medial left leg. He has more predominantly venous insufficiency wounds on predominantly the right medial leg above the ankle the right foot into sections. He has recently been put on Augmentin at the nursing home. Previous ABIs/arterial evaluation showed triphasic waves diffusely he does not have a major ischemic issue a left lower extremity venous duplex also exam showed no evidence of a DVT on 6/21 07/21/15; the patient arrives with really no major change. Culture I did last time was negative. He has not had a follow-up MRI I ordered. The wounds are macerated covered with a thick gelatinous surface slough. There is necrotic subcutaneous tissue 08/04/15; the patient arrives with a considerable improvement in the majority of his wound area. The area on the left leg now has what looks to be a granulated base. Most of the wounds on the left leg required an aggressive surgical debridement to remove nonviable fibrinous eschar and subcutaneous tissue however after debridement most of this looks better. Although I had asked for repeat MRI of the left leg when he first came in here with grossly purulent material coming out of his wounds, it does not appear that this is been done and nor do I actually feel that strongly about it right now. He has severe surrounding venous insufficiency and inflammation. I don't believe he has significant PAD 08/25/15. In general there is still a considerable wound area here  but with still extensive service adherent slough. The patient will not  allow mechanical debridement due to pain. He did not tolerate Medihoney therefore we are left with Santyl for now. She would appear that he has severe surrounding venous stasis. There is no evidence of the infection that may have had something to do with the pathogenesis of these wounds 09/29/15; patient still has substantial wound area on the left leg with a cluster of several wounds on the lateral leg confluently on the left leg posteriorly and then a substantial wound on the medial leg. On the right leg a substantial wound medially and a small area on the right lateral foot. All of these underwent a substantial surgical debridement with curettes which she tolerated better than he has in the past. This started as a complex cellulitis in the face of chronic venous insufficiency and inflammation. 10/13/15; substantial cluster of wounds on the lateral aspect of his left leg confluently around to the other side. Extensive surgical debridement to remove for redness surface slough nonviable subcutaneous tissue. This is not an improvement. Also substantial wound on the medial right leg which is largely unchanged. The etiology of this was felt to be a complex cellulitis in the summer of 2016 in the face of chronic venous insufficiency and inflammation. The patient states that the wound on the right medial leg has been there for years off and on. 10/20/15; we are able to start [facility] to these extensive wound areas left greater than right on Tuesday after I spoke to the wound care nurse at the facility. He arrives here today for extensive surgical debridement for the wounds on the lateral aspect of his left leg posterior left leg, this is almost circumferential. He has a large wound on the medial aspect of his right leg although he finds this too painful for debridement 10/27/15; I continue to bring this patient back for frequent debridement/weekly debridement severe. We have been using Hydrofera Blue.  Unfortunately this has not really had any improvement. The debridement surgery difficult and painful for the patient 11/23/15; this patient spent a complex hospitalization admitted with acute kidney injury, anemia, cellulitis of the lower extremity, he was felt to have sepsis pathophysiology although his blood cultures were negative. He had plain x-rays of both legs that were negative for osseous abnormalities. He was seen by infectious disease and placed on a workup for vasculitis that was negative including a biopsy o4 all were consistent with stasis dermatitis. Vital broad-spectrum antibiotics he continued to spike fevers. Urine and chest x-ray were negative Dopplers on admission ruled out a DVT All antibiotics were stopped on 2/17. . Since his return to [facility] skilled nursing facility I believe they have been using Xeroform 12/07/15 the patient arrives today with the area on his right medial leg actually looking quite stable. No debridement. The rest of his extensive wounds on the left lateral left posterior extending into the left medial leg all required extensive debridement. I think this is probably going to need to and up in the hands of plastic surgery. We'll attempt to change him back to [facility]. Arrange consultation with plastic surgery at [facility] for this almost circumferential wound on the left side 12/21/15 right leg covered in surface slough. This was debridement. Left leg extensive wounds all carefully examined. This is almost circumferential on the left side especially on the posterior calf. No debridement is necessary. 01/04/16 the patient has been to [facility] plastic surgery. Unfortunately it doesn't look  like they had any of the prior workup on this patient. They're in the middle of vascular workup. It sounds as though they're applying Hydrofera Blue at the nursing home. 01/22/16; the patient has been back to see [facility]. There apparently making plans to possibly do  skin grafts over his large venous insufficiency ulcerations. The patient tells me that he goes to hematology in [facility] and variably uses the term platelets red cells and the description of his problem. He is also a [employer], [place] ([id number]) [id number] Page 10 of 17 Je[employer] and will not allow transfusion of blood products. I do not see any major difference in the wound on the right medial leg and circumferentially across the left medial to left lateral leg. Did not attempt to debride these today. Readmission: 05/05/18 on evaluation today patient presents for reevaluation here in our clinic although I have previously seen him in the skilled nursing facility over the past year and a half when I was working in the skilled nursing facility realm instead of covering in the clinic. With that being said during that time we had initiated most recently dressings with Hydrofera Blue Dressing along with the [employer] which had done very well for him. Much better than the Kerlex Coban wraps. With that being said the patient had been doing fairly well and was making progress when I last saw him. Upon evaluation today it appears that the wounds are actually a little bit worse than when I last saw him although definitely not dramatically so. There does not appear to be any evidence of infection which is good news. With that being said he has been tolerating the wraps without complication his left hip still gives him a lot of trouble which is his main issue as far as movement is concerned. Unbeknownst to me he has not had venous studies that I can find. He previously told me he had there were arterial studies noted back from 04/27/15 which showed that he had try phasic blood flow throughout and again his test appear to be completely normal as performed by Dr. Creig Hines. With that being said I cannot find where he had venous studies. The patient still states he thinks he did  these definitely had no venous intervention at this point. Upon evaluation today it does not appear to me that the patient has any evidence of cellulitis. He does have some chronic venous insufficiency which I think has led to stasis dermatitis but again this is not appearing to be infected at this point. No fevers, chills, nausea, or vomiting noted at this time. READMISSION 06/30/2018 This is a now 66 year old man we have had in this clinic at multiple times in the past. Most recently he came in August and was seen by [facility] stone. It is not clear why he did not come back we asked him really did not get a straight answer. He is at [facility] skilled facility he is a man who has severe chronic venous insufficiency with lymphedema and has had severe wounds on his left greater than right leg. We had him here in 2016 and 17 ultimately referred him to [facility]. He had arterial studies and venous studies done at [facility] although I am not able to access these records I see they are actually done. He also had multiple biopsies that were negative for malignancy showing changes compatible with chronic inflammation. As I understand things in [place] they are using Iodoflex and Unna boots. I  am not really sure how they are getting enough Iodoflex for the large wound area especially on his left calf. Nevertheless the wounds look better than when I saw this in 2017. There were plans for him to have plastic surgery in 2018 and I think he was actually prepped for surgery however he was ultimately denied because the patient was an active smoker. The patient is not systemically unwell. The wounds are painful however given the size especially on the left this is not surprising. ABIs in this clinic were 0.78 on the left and 0.91 on the right 07/13/2018; this is a patient with bilateral severe venous ulcers with secondary lymphedema. The wounds are on the right medial calf and a substantial part of the left  posterior calf and some involvement medially and laterally on the left. We changed him to silver alginate under Unna boot's last time. The wounds actually look fairly satisfactory today. 08/14/2018; this is a patient with severe bilateral venous stasis ulcers with secondary lymphedema left greater than right he is at [facility] using silver alginate under Unna boot's I do not think there is much change on this visit versus last time I saw him a month ago Readmission: 11/04/18 patient presents today for follow-up concerning his bilateral lower extremity ulcers. His a right medial ankle ulcer and a left leg that has ulcers over a large proportion of the surface area between the ankle and knee. Unfortunately this does cause some discomfort for the patient although it doesn't seem to be as uncomfortable as it has been in the past. He is seen today for evaluation after a referral by [place] back to the clinic. Unfortunately has a lot of Slough noted on the surface of his wounds. He's been treated currently it sounds like with Dakin's soaked gauze and they have not been performing the Unna Boot wrap that was previously recommended. I'm not exactly sure what the reason for the change was. He does have less slough on the surface of the wound but again this is something that is often a constant issue for him. Again he's had these wounds for many years. I've known him for two years at least during that time when I was taking care of them in the facility he is Artie had the wounds for several years prior. READMISSION 02/25/2020 Josepha Pigg is a now 66 year old man. He has been in our clinic several times before most extensively in October 2016 through May 2017. At that time he had absolutely substantial bilateral lower extremity wounds secondary to chronic venous insufficiency and lymphedema. We ultimately referred him to [facility] he had a series of biopsies only showed suggestion of wound secondary to chronic  venous insufficiency. He had a less extensive stay in our clinic in 2019 and then a single visit in February 2020. He is a resident at [facility] skilled facility. I think he has had intermittently had in facility wound care. He returns today. They are using silver alginate on the wounds although I am not sure what type of compression. Previously he has favored Unna boots. He is not felt to have an arterial issue. Past medical history; lymphedema and chronic venous insufficiency, diabetes mellitus, hypertension, congestive heart failure We did not do arterial studies today 04/27/2020 on evaluation patient appears to be doing really about the same as when I have known him and seen him in the past. He does have wounds on the right and left legs at this point. Fortunately there is no  signs of active infection at this time. 9/2; 1 month follow-up. This is a man with severe chronic venous insufficiency and lymphedema. When I first met him he had horrible circumferential bilateral lower extremity ulcers. We have been using silver alginate up until early last month when it was changed to [facility] and Unna boot's more or less says maintenance dressings. Since the last time he was here the facility where he resides [facility] called to report they could no longer do Unna boot so he is apparently only been receiving kerlix Coban the left leg is certainly a lot worse with almost circumferential large wounds especially lateral but now medial and posterior as well. He has a standard same looking wound on the right medial lower leg. 10/1; absolutely no improvement here. In fact I do not think anything much is changed. The substantial area on his left lateral and left posterior calf is still open may be slightly larger. He has a more modest wound on the right medial calf. None of these look any different. He comes in with a kerlix Coban wrap this will simply not be adequate. He has too much edema in this  leg He also is complaining a lot of pain in his left heel area. 11/8; potential wounds on the left lateral and posterior calf and a smaller oval wound in the right medial. We have been using Hydrofera Blue under 4-layer compression. He was using Unna boots still 2 months ago the facility called to say they can no longer place these so we have been using 4-layer compression. The surface area of the area on the left is so large there is very few alternatives to what we can use on this either silver alginate or Hydrofera Blue. He was complaining of pain on the left heel last time I asked for an x-ray this was apparently done although we do not have the result. He is not complaining of pain today. 08/16/2020 on evaluation today patient is is seen for a early visit due to the fact that he apparently is having issues I was told when they called on Monday with a MRSA infection that they felt like we needed to see him for because his legs "looked horrible". With that being said based on what I see at this point it does not appear that the patient is actually having a terrible infection in fact compared to last time I saw him his legs do not appear to be doing too poorly at all at this time. Nonetheless he has been on doxycycline that is not going to be a good option for him based on the culture report that I have for review today which graded as a partial report with still several organisms pending as far as identification is concerned we did contact them but again they do not have the final report yet. Either way based on what we see it appears that doxycycline is one of the few medications that will not work for the Citrobacter. Obviously I think that a different medication would be a good option for him since there is a gram-negative organism pending I am likely can I suggest Cipro as the best of backup antibiotic to switch him to at this point. All this was discussed with the patient today. 12/20; not  much change in the substantial wound on the left posterior calf and the small area on the right medial. He has a new area on the left anterior which Whisnant, Rishab ([id number])  [phone].pdf Page 11 of 17 looks like a wrap injury. Had some thoughts about doing a deep tissue culture here although we will put that off for next time. Using silver alginate as a primary dressing 2/10; substantial area on the left posterior calf. This measures smaller but still is a very large area. He has the area on the right anterior medial which also is a fairly large wound but much smaller than not on the left. His edema control is quite good. We have been using silver alginate because of the size of the wounds. 4/7; substantial wound on the left posterior calf and a smaller area on the right medial lower leg. These are very chronic wounds which we have classified is venous. Previously worked up at [facility] for 5 years ago at which time I had sent and will send him over for consideration of extensive skin graft I know they biopsied this many times but he never had plastic surgery. The wound area is much too large for standard size dressings we have been using silver alginate but we did give him a good trial last fall of Hydrofera Blue that did not make any difference either. If anything the area on the right medial lower leg over time has gotten a lot smaller as has the area on the left although it still quite substantial now. Readmission 10/08/2021 Mr. Jaysion Ramseyer is a 66 year old male with a past medical history of insulin-dependent type 2 diabetes with last hemoglobin A1c of 10.2, chronic venous insufficiency, lymphedema and chronic diastolic heart failure that presents to the clinic for bilateral lower extremity wounds. He has been followed in our clinic previously for the past 6 years for these issues. He was last seen in April 2022. He is inconsistent with his appointments in our clinic.  These wounds have not healed over the past few years. He is currently using silver alginate to the wound beds and keeping these covered with Kerlix. He currently denies systemic signs of infection. He was recently hospitalized for septic shock secondary to bacteremia from likely chronic leg wounds And is currently on oral antibiotics to be completed this week. 1/30; patient presents for follow-up. He has no issues or complaints today. 2/6; patient presents for follow-up. He reports more discomfort with the increase in compression wraps. He currently denies systemic signs of infection and feels well overall. 2/13; patient presents for follow-up. He reports tolerating the current leg/Coban wrap well. He denies systemic signs of infection and feels fine overall. 2/21; patient presents for follow-up. He has no issues or complaints today. He tolerated the kirlex/Coban wrap. He denies systemic signs of infection. 3/21; patient presents for follow-up. He has no issues or complaints today. He saw infectious disease, Dr. Megan Salon. Nothing further to do from an infectious disease point. Levaquin was stopped. He currently denies systemic signs of infection. 4/4; patient presents for follow-up. His facility was able to obtain the [facility] antibiotic and has started using this with dressing changes/wrap changes. Patient has no issues or complaints today. He denies signs of infection. 4/18; patient presents for follow-up. He has no issues or complaints today. He denies signs of infection. 5/9; patient presents for follow-up. He has been using Keystone antibiotic under Kerlix/Coban. He denies signs of infection today. 6/6; monthly follow-up. Patient is at [facility] skilled facility. He has been using Keystone probably silver alginate under kerlix Coban. We use Sorbact here. Wound condition has improved 7/11; patient presents for follow-up. We have been using [place] with  Sorbact under Kerlix/Coban. Patient has  no issues or complaints today. 8/8; patient presents for follow-up. He is still using Keystone and Sorbact under Kerlix/Coban to his lower extremities bilaterally. He has no issues or complaints today. He denies signs of infection. 9/12; patient presents for follow-up. We have been using Keystone with Sorbact and calcium alginate under Kerlix/Coban to lower extremities bilaterally. He has no issues or complaints today. 10/10; patient presents for follow-up. We continue to use [facility] with Sorbact and calcium alginate under Kerlix/Coban. He has tolerated this well and he has no issues or complaints today. Patient History Information obtained from Patient. Family History Diabetes - Mother,Father,Siblings, Hypertension - Mother,Father, No family history of Cancer, Heart Disease, Hereditary Spherocytosis, Kidney Disease, Lung Disease, Seizures, Stroke, Thyroid Problems, Tuberculosis. Social History Current every day smoker - 4 cig per day, Marital Status - Separated, Alcohol Use - Never, Drug Use - Prior History - cocaine, [place] quit 2005, Caffeine Use - Moderate. Medical History Eyes Denies history of Cataracts, Glaucoma, Optic Neuritis Ear/Nose/Mouth/Throat Denies history of Chronic sinus problems/congestion, Middle ear problems Hematologic/Lymphatic Patient has history of Anemia - iron deficiency Denies history of Hemophilia, Human Immunodeficiency Virus, Lymphedema, Sickle Cell Disease Respiratory Patient has history of Sleep Apnea Denies history of Aspiration, Asthma, Chronic Obstructive Pulmonary Disease (COPD), Pneumothorax, Tuberculosis Cardiovascular Patient has history of Congestive Heart Failure, Coronary Artery Disease, Hypertension, Peripheral Venous Disease Denies history of Angina, Arrhythmia, Deep Vein Thrombosis, Hypotension, Myocardial Infarction, Peripheral Arterial Disease, Phlebitis, Vasculitis Gastrointestinal Denies history of Cirrhosis , Colitis, Crohnoos, Hepatitis  A, Hepatitis B, Hepatitis C Endocrine Patient has history of Type II Diabetes - uncontrolled Denies history of Type I Diabetes Genitourinary Denies history of End Stage Renal Disease Immunological Denies history of Lupus Erythematosus, Raynaudoos, Scleroderma Integumentary (Skin) Denies history of History of Burn Musculoskeletal Patient has history of Osteoarthritis Denies history of Gout, Rheumatoid Arthritis, Osteomyelitis Dantonio, Ivin ([id number]) [id number] Page 12 of 17 Neurologic Patient has history of Neuropathy Denies history of Dementia, Quadriplegia, Paraplegia, Seizure Disorder Oncologic Denies history of Received Chemotherapy, Received Radiation Psychiatric Denies history of Anorexia/bulimia, Confinement Anxiety Hospitalization/Surgery History - inguinal hernia repair with mesh. - right shoulder rotator cuff repair. - toe nail excision. - Sepsis 09/03/21-09/10/22. Medical A Surgical History Notes nd Gastrointestinal GERD Musculoskeletal degenerative righ thip Psychiatric depression Objective Constitutional respirations regular, non-labored and within target range for patient.. Vitals Time Taken: 12:50 PM, Height: 77 in, Weight: 258 lbs, BMI: 30.6, Temperature: 98.9 F, Pulse: 85 bpm, Respiratory Rate: 16 breaths/min, Blood Pressure: 132/78 mmHg. Cardiovascular 2+ dorsalis pedis/posterior tibialis pulses. Psychiatric pleasant and cooperative. General Notes: Bilateral lower extremity wounds with granulation tissue and nonviable tissue, left > right. No signs of surrounding infection. Evidence of lymphedema skin changes bilaterally to the lower extremities. Integumentary (Hair, Skin) Wound #25 status is Open. Original cause of wound was Gradually Appeared. The date acquired was: 09/07/2021. The wound has been in treatment 38 weeks. The wound is located on the Right,Medial Lower Leg. The wound measures 5.7cm length x 2.2cm width x 0.2cm  depth; 9.849cm^2 area and 1.97cm^3 volume. There is Fat Layer (Subcutaneous Tissue) exposed. There is no tunneling or undermining noted. There is a medium amount of serosanguineous drainage noted. The wound margin is thickened. There is large (67-100%) red, pink granulation within the wound bed. There is no necrotic tissue within the wound bed. The periwound skin appearance did not exhibit: Callus, Crepitus, Excoriation, Induration, Rash, Scarring, Dry/Scaly, Maceration, Atrophie Blanche, Cyanosis, Ecchymosis, Hemosiderin Staining, Mottled, Pallor, Rubor, Erythema.  Wound #27 status is Open. Original cause of wound was Gradually Appeared. The date acquired was: 09/07/2021. The wound has been in treatment 38 weeks. The wound is located on the Left,Posterior Lower Leg. The wound measures 14cm length x 15.8cm width x 0.3cm depth; 173.73cm^2 area and 52.119cm^3 volume. There is Fat Layer (Subcutaneous Tissue) exposed. There is no tunneling or undermining noted. There is a medium amount of serosanguineous drainage noted. The wound margin is distinct with the outline attached to the wound base. There is large (67-100%) red, friable granulation within the wound bed. There is no necrotic tissue within the wound bed. The periwound skin appearance did not exhibit: Callus, Crepitus, Excoriation, Induration, Rash, Scarring, Dry/Scaly, Maceration, Atrophie Blanche, Cyanosis, Ecchymosis, Hemosiderin Staining, Mottled, Pallor, Rubor, Erythema. Wound #29 status is Open. Original cause of wound was Gradually Appeared. The date acquired was: 04/30/2022. The wound has been in treatment 9 weeks. The wound is located on the Right,Lateral Lower Leg. The wound measures 1.3cm length x 1.2cm width x 0.3cm depth; 1.225cm^2 area and 0.368cm^3 volume. There is Fat Layer (Subcutaneous Tissue) exposed. There is no tunneling or undermining noted. There is a medium amount of serosanguineous drainage noted. The wound margin is distinct  with the outline attached to the wound base. There is medium (34-66%) red, pink granulation within the wound bed. There is a medium (34-66%) amount of necrotic tissue within the wound bed including Adherent Slough. The periwound skin appearance did not exhibit: Callus, Crepitus, Excoriation, Induration, Rash, Scarring, Dry/Scaly, Maceration, Atrophie Blanche, Cyanosis, Ecchymosis, Hemosiderin Staining, Mottled, Pallor, Rubor, Erythema. Assessment Active Problems ICD-10 Chronic venous hypertension (idiopathic) with ulcer and inflammation of bilateral lower extremity Non-pressure chronic ulcer of other part of left lower leg with fat layer exposed Non-pressure chronic ulcer of unspecified part of right lower leg with fat layer exposed Lymphedema, not elsewhere classified Moderate protein-calorie malnutrition Type 2 diabetes mellitus with other skin ulcer Chronic diastolic (congestive) heart failure Ferrone, Keola ([id number]) [id number] Page 13 of 17 Patient's wounds have shown slight improvement in size appearance since last clinic visit. I debrided nonviable tissue. I recommended continuing the course with Keystone antibiotic ointment, Sorbact and calcium alginate under Kerlix/Coban. Follow-up in 1 month. Patient resides in a facility that changes his dressings weekly. Procedures Wound #25 Pre-procedure diagnosis of Wound #25 is a Venous Leg Ulcer located on the Right,Medial Lower Leg .Severity of Tissue Pre Debridement is: Fat layer exposed. There was a Excisional Skin/Subcutaneous Tissue Debridement with a total area of 12.54 sq cm performed by [name], DO. With the following instrument(s): Curette to remove Viable and Non-Viable tissue/material. Material removed includes Subcutaneous Tissue, Slough, and Biofilm after achieving pain control using Lidocaine 4% Topical Solution. A time out was conducted at 13:25, prior to the start of the procedure. A Minimum  amount of bleeding was controlled with Pressure. The procedure was tolerated well with a pain level of 0 throughout and a pain level of 0 following the procedure. Post Debridement Measurements: 5.7cm length x 2.2cm width x 0.2cm depth; 1.97cm^3 volume. Character of Wound/Ulcer Post Debridement is improved. Severity of Tissue Post Debridement is: Fat layer exposed. Post procedure Diagnosis Wound #25: Same as Pre-Procedure Wound #27 Pre-procedure diagnosis of Wound #27 is a Venous Leg Ulcer located on the Left,Posterior Lower Leg .Severity of Tissue Pre Debridement is: Fat layer exposed. There was a Excisional Skin/Subcutaneous Tissue Debridement with a total area of 140 sq cm performed by [name], DO. With the following instrument(s): Curette to remove  Viable and Non-Viable tissue/material. Material removed includes Subcutaneous Tissue, Slough, and Biofilm after achieving pain control using Lidocaine 4% Topical Solution. A time out was conducted at 13:25, prior to the start of the procedure. A Minimum amount of bleeding was controlled with Pressure. The procedure was tolerated well with a pain level of 0 throughout and a pain level of 0 following the procedure. Post Debridement Measurements: 14cm length x 15.8cm width x 0.3cm depth; 52.119cm^3 volume. Character of Wound/Ulcer Post Debridement is improved. Severity of Tissue Post Debridement is: Fat layer exposed. Post procedure Diagnosis Wound #27: Same as Pre-Procedure Wound #29 Pre-procedure diagnosis of Wound #29 is a Venous Leg Ulcer located on the Right,Lateral Lower Leg .Severity of Tissue Pre Debridement is: Fat layer exposed. There was a Excisional Skin/Subcutaneous Tissue Debridement with a total area of 1.56 sq cm performed by [name], DO. With the following instrument(s): Curette to remove Viable and Non-Viable tissue/material. Material removed includes Subcutaneous Tissue, Slough, and Biofilm after achieving pain  control using Lidocaine 4% Topical Solution. A time out was conducted at 13:25, prior to the start of the procedure. A Minimum amount of bleeding was controlled with Pressure. The procedure was tolerated well with a pain level of 0 throughout and a pain level of 0 following the procedure. Post Debridement Measurements: 1.3cm length x 1.2cm width x 0.3cm depth; 0.368cm^3 volume. Character of Wound/Ulcer Post Debridement is improved. Severity of Tissue Post Debridement is: Fat layer exposed. Post procedure Diagnosis Wound #29: Same as Pre-Procedure Plan Follow-up Appointments: Return appointment in 1 month. - 4 weeks Dr. Heber Darien w/ [facility] Room #9 NEED STRETCHER ROOM ****EXTRA TIME 75 MINS. HOYEr/HIGH ACUITY*** No appt.'s after 11:00 and after 3:00!!**** ****PLEASE WRAP LEGS BEGINNING AT TOES AND GOING TO BEND OF KNEE*** Other: - Pt. to bring in [facility] to every [facility] appt. please!!:) Anesthetic: (In clinic) Topical Lidocaine 4% applied to wound bed Bathing/ Shower/ Hygiene: May shower and wash wound with soap and water. - when changing dressing Edema Control - Lymphedema / SCD / Other: Elevate legs to the level of the heart or above for 30 minutes daily and/or when sitting, a frequency of: - throughout the day Moisturize legs daily. - with dressing changes Additional Orders / Instructions: Follow Nutritious Diet - Monitor/Control Blood Sugars-Continue using ProStat Other: - Go to emergency room for fever, chills, strong odor from wounds, increased pain in wounds The following medication(s) was prescribed: lidocaine topical 4 % cream cream topical applied only in clinic for debridements. was prescribed at facility WOUND #25: - Lower Leg Wound Laterality: Right, Medial Cleanser: Soap and Water 3 x Per Week/30 Days Discharge Instructions: May shower and wash wound with dial antibacterial soap and water prior to dressing change. Cleanser: Wound Cleanser 3 x Per Week/30 Days Discharge Instructions:  Cleanse the wound with wound cleanser prior to applying a clean dressing using gauze sponges, not tissue or cotton balls. Peri-Wound Care: Zinc Oxide Ointment 30g tube 3 x Per Week/30 Days Discharge Instructions: Apply Zinc Oxide to periwound with each dressing change Peri-Wound Care: Sween Lotion (Moisturizing lotion) 3 x Per Week/30 Days Discharge Instructions: Apply moisturizing lotion as directed Prim Dressing: Maxorb Extra Calcium Alginate Dressing, 4x4 in 3 x Per Week/30 Days ary Discharge Instructions: Apply calcium alginate over the cutimed to help absorb drainage. Prim Dressing: Cutimed Sorbact Swab 3 x Per Week/30 Days ary Discharge Instructions: Apply to wound bed as instructed Prim Dressing: Keystone Antibiotic spray 3 x Per Week/30 Days ary Secondary Dressing: ABD  Pad, 8x10 3 x Per Week/30 Days Discharge Instructions: Apply over primary dressing as directed. Com pression Wrap: Kerlix Roll 4.5x3.1 (in/yd) 3 x Per Week/30 Days Discharge Instructions: Apply Kerlix and Coban compression as directed. Com pression Wrap: Coban Self-Adherent Wrap 4x5 (in/yd) 3 x Per Week/30 Days Discharge Instructions: Apply over Kerlix as directed. WOUND #27: - Lower Leg Wound Laterality: Left, Posterior Cleanser: Soap and Water 3 x Per Week/30 Days Discharge Instructions: May shower and wash wound with dial antibacterial soap and water prior to dressing change. Cleanser: Wound Cleanser 3 x Per Week/30 Days Discharge Instructions: Cleanse the wound with wound cleanser prior to applying a clean dressing using gauze sponges, not tissue or cotton balls. Peri-Wound Care: Zinc Oxide Ointment 30g tube 3 x Per Week/30 Days Discharge Instructions: Apply Zinc Oxide to periwound with each dressing change Klecker, [place] ([id number]) [id number] Page 14 of 17 Peri-Wound Care: Sween Lotion (Moisturizing lotion) 3 x Per Week/30 Days Discharge Instructions: Apply moisturizing lotion as  directed Prim Dressing: Maxorb Extra Calcium Alginate Dressing, 4x4 in 3 x Per Week/30 Days ary Discharge Instructions: Apply calcium alginate over the cutimed to help absorb drainage. Prim Dressing: Cutimed Sorbact Swab 3 x Per Week/30 Days ary Discharge Instructions: Apply to wound bed as instructed Prim Dressing: Keystone Antibiotic spray 3 x Per Week/30 Days ary Secondary Dressing: ABD Pad, 8x10 3 x Per Week/30 Days Discharge Instructions: Apply over primary dressing as directed. Com pression Wrap: Kerlix Roll 4.5x3.1 (in/yd) 3 x Per Week/30 Days Discharge Instructions: Apply Kerlix and Coban compression as directed. Com pression Wrap: Coban Self-Adherent Wrap 4x5 (in/yd) 3 x Per Week/30 Days Discharge Instructions: Apply over Kerlix as directed. WOUND #29: - Lower Leg Wound Laterality: Right, Lateral Cleanser: Soap and Water 3 x Per Week/30 Days Discharge Instructions: May shower and wash wound with dial antibacterial soap and water prior to dressing change. Cleanser: Wound Cleanser 3 x Per Week/30 Days Discharge Instructions: Cleanse the wound with wound cleanser prior to applying a clean dressing using gauze sponges, not tissue or cotton balls. Peri-Wound Care: Zinc Oxide Ointment 30g tube 3 x Per Week/30 Days Discharge Instructions: Apply Zinc Oxide to periwound with each dressing change Peri-Wound Care: Sween Lotion (Moisturizing lotion) 3 x Per Week/30 Days Discharge Instructions: Apply moisturizing lotion as directed Prim Dressing: Maxorb Extra Calcium Alginate Dressing, 4x4 in 3 x Per Week/30 Days ary Discharge Instructions: Apply calcium alginate over the cutimed to help absorb drainage. Prim Dressing: Cutimed Sorbact Swab 3 x Per Week/30 Days ary Discharge Instructions: Apply to wound bed as instructed Prim Dressing: Keystone Antibiotic spray 3 x Per Week/30 Days ary Secondary Dressing: ABD Pad, 8x10 3 x Per Week/30 Days Discharge Instructions: Apply over primary  dressing as directed. Com pression Wrap: Kerlix Roll 4.5x3.1 (in/yd) 3 x Per Week/30 Days Discharge Instructions: Apply Kerlix and Coban compression as directed. Com pression Wrap: Coban Self-Adherent Wrap 4x5 (in/yd) 3 x Per Week/30 Days Discharge Instructions: Apply over Kerlix as directed. 1. In office sharp debridement 2. Keystone antibiotic ointment, calcium alginate and Sorbact under Kerlix/Coban 3. Follow-up in 1 month. Electronic Signature(s) Signed: 07/02/2022 4:32:14 PM By: [name] DO Entered By: [name] on 07/02/2022 13:51:46 -------------------------------------------------------------------------------- HxROS Details [name], [name] 07/02/2022 12:45 PM Medical Record Number: [id number] Patient Account Number: [id number] Date of Birth/Sex: Treating RN: 04/03/1956 (66 y.o. [name] Primary Care Provider: [name] Other Clinician: Referring Provider: Treating Provider/Extender: [name] in Treatment: [phone]  Information Obtained From Patient Eyes Medical History: Negative for: Cataracts; Glaucoma; Optic Neuritis Ear/Nose/Mouth/Throat Medical History: Negative for: Chronic sinus problems/congestion; Middle ear problems Hematologic/Lymphatic Medical History: Positive for: Anemia - iron deficiency Negative for: Hemophilia; Human Immunodeficiency Virus; Lymphedema; Sickle Cell Disease Lalley, Shloimy ([id number]) [id number] Page 15 of 17 Respiratory Medical History: Positive for: Sleep Apnea Negative for: Aspiration; Asthma; Chronic Obstructive Pulmonary Disease (COPD); Pneumothorax; Tuberculosis Cardiovascular Medical History: Positive for: Congestive Heart Failure; Coronary Artery Disease; Hypertension; Peripheral Venous Disease Negative for: Angina; Arrhythmia; Deep Vein Thrombosis; Hypotension; Myocardial Infarction; Peripheral Arterial Disease; Phlebitis;  Vasculitis Gastrointestinal Medical History: Negative for: Cirrhosis ; Colitis; Crohns; Hepatitis A; Hepatitis B; Hepatitis C Past Medical History Notes: GERD Endocrine Medical History: Positive for: Type II Diabetes - uncontrolled Negative for: Type I Diabetes Time with diabetes: 1999 Treated with: Insulin Blood sugar tested every day: Yes T ested : 3x per day Blood sugar testing results: Breakfast: 81-200; Lunch: 180-210; Dinner: 200 Genitourinary Medical History: Negative for: End Stage Renal Disease Immunological Medical History: Negative for: Lupus Erythematosus; Raynauds; Scleroderma Integumentary (Skin) Medical History: Negative for: History of Burn Musculoskeletal Medical History: Positive for: Osteoarthritis Negative for: Gout; Rheumatoid Arthritis; Osteomyelitis Past Medical History Notes: degenerative righ thip Neurologic Medical History: Positive for: Neuropathy Negative for: Dementia; Quadriplegia; Paraplegia; Seizure Disorder Oncologic Medical History: Negative for: Received Chemotherapy; Received Radiation Psychiatric Medical History: Negative for: Anorexia/bulimia; Confinement Anxiety Past Medical History Notes: depression Immunizations Pneumococcal Vaccine: Received Pneumococcal Vaccination: Yes Received Pneumococcal Vaccination On or After 60th Birthday: No Implantable Devices None Karas, Jaret ([id number]) [id number] Page 16 of 17 Hospitalization / Surgery History Type of Hospitalization/Surgery inguinal hernia repair with mesh right shoulder rotator cuff repair toe nail excision Sepsis 09/03/21-09/10/22 Family and Social History Cancer: No; Diabetes: Yes - Mother,Father,Siblings; Heart Disease: No; Hereditary Spherocytosis: No; Hypertension: Yes - Mother,Father; Kidney Disease: No; Lung Disease: No; Seizures: No; Stroke: No; Thyroid Problems: No; Tuberculosis: No; Current every day smoker - 4 cig per day;  Marital Status - Separated; Alcohol Use: Never; Drug Use: Prior History - cocaine, [place] quit 2005; Caffeine Use: Moderate; Financial Concerns: No; Food, Clothing or Shelter Needs: No; Support System Lacking: No; Transportation Concerns: No Electronic Signature(s) Signed: 07/02/2022 4:32:14 PM By: [name] DO Signed: 07/02/2022 6:12:04 PM By: [name] RN, BSN Entered By: [name] on 07/02/2022 13:27:56 -------------------------------------------------------------------------------- SuperBill Details [name], [name] 07/02/2022 Medical Record Number: [id number] Patient Account Number: [id number] Date of Birth/Sex: Treating RN: 1955/11/15 (66 y.o. [name] Primary Care Provider: [name] Other Clinician: Referring Provider: Treating Provider/Extender: [name] in Treatment: 38 Diagnosis Coding ICD-10 Codes Code Description [phone] Chronic venous hypertension (idiopathic) with ulcer and inflammation of bilateral lower extremity 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ed L97.912 Non-pressure chronic ulcer of unspecified part of right lower leg with fat layer exposed I89.0 Lymphedema, not elsewhere classified E44.0 Moderate protein-calorie malnutrition E11.622 Type 2 diabetes mellitus with other skin ulcer B35.32 Chronic diastolic (congestive) heart failure Facility Procedures : CPT4 Code: [id number] Description: 11042 - DEB SUBQ TISSUE 20 SQ CM/< ICD-10 Diagnosis Description 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 L97.912 Non-pressure chronic ulcer of unspecified part of right lower leg with fat laye Modifier: ed r exposed Quantity: 1 : CPT4 Code: [id number] Description: 11045 - DEB SUBQ TISS EA ADDL 20CM ICD-10 Diagnosis Description L97.912 Non-pressure chronic ulcer of unspecified part  of right lower leg  with fat laye 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 Modifier: r exposed ed Quantity: 7 Physician Procedures : CPT4 Code Description Modifier [id number] 11042 - WC PHYS SUBQ TISS 20 SQ CM ICD-10 Diagnosis Description 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ed L97.912 Non-pressure chronic ulcer of unspecified part of  right lower leg with fat layer exposed Quantity: 1 : [id number] 11045 - WC PHYS SUBQ TISS EA ADDL 20 CM ICD-10 Diagnosis Description I69.629 Non-pressure chronic ulcer of unspecified part of right lower leg with fat layer exposed Jacobs, Amiri ([id number]) [id number] W10.272  Non-pressure chronic ulcer of other part of left lower leg with fat layer exposed Quantity: 7 .pdf Page 17 of 17 Electronic Signature(s) Signed: 07/02/2022 4:32:14 PM By: [name] DO Entered By: [name] on 07/02/2022 13:52:02

## 2022-07-02 NOTE — Progress Notes (Signed)
Righter, Mardell ([id number]) [id number] Page 1 of 10 Visit Report for 07/02/2022 Arrival Information Details [name], [name] 07/02/2022 12:45 PM Medical Record Number: [id number] Patient Account Number: [id number] Date of Birth/Sex: Treating RN: [birth year] (66 y.o. [name] Primary Care Koltyn Kelsay: [name] Other Clinician: Referring Kambryn Dapolito: Treating Telissa Palmisano/Extender: [name] in Treatment: 23 Visit Information History Since Last Visit Added or deleted any medications: No Patient Arrived: Wheel Chair Any new allergies or adverse reactions: No Arrival Time: 12:49 Had a fall or experienced change in No Accompanied By: caregivers from SNF activities of daily living that may affect Transfer Assistance: None risk of falls: Patient Identification Verified: Yes Signs or symptoms of abuse/neglect since last visito No Secondary Verification Process Completed: Yes Hospitalized since last visit: No Patient Requires Transmission-Based No Implantable device outside of the clinic excluding No Precautions: cellular tissue based products placed in the center Patient Has Alerts: Yes since last visit: Patient Alerts: Patient on Blood Thinner Has Dressing in Place as Prescribed: Yes *NO BLOOD Has Compression in Place as Prescribed: Yes PRODUCTS* Pain Present Now: Yes Electronic Signature(s) Signed: 07/02/2022 5:19:10 PM By: Lorrin Jackson Entered By: Lorrin Jackson on 07/02/2022 12:50:07 -------------------------------------------------------------------------------- Encounter Discharge Information Details [name], [name] 07/02/2022 12:45 PM Medical Record Number: [id number] Patient Account Number: [id number] Date of Birth/Sex: Treating RN: 18-Dec-1955 (66 y.o. [name] Primary Care Chalmer Zheng: [name] Other Clinician: Referring Hayward Rylander: Treating  Lachandra Dettmann/Extender: [name] in Treatment: 21 Encounter Discharge Information Items Post Procedure Vitals Discharge Condition: Stable Temperature (F): 98.9 Ambulatory Status: Wheelchair Pulse (bpm): 85 Discharge Destination: Home Respiratory Rate (breaths/min): 16 Transportation: Private Auto Blood Pressure (mmHg): 132/78 Accompanied By: self Schedule Follow-up Appointment: Yes Clinical Summary of Care: Electronic Signature(s) Signed: 07/02/2022 6:12:04 PM By: [name] RN, BSN Entered By: [name] on 07/02/2022 13:29:36 Limones, Jaykob ([id number]) [id number] Page 2 of 10 -------------------------------------------------------------------------------- Lower Extremity Assessment Details [name], [name] 07/02/2022 12:45 PM Medical Record Number: [id number] Patient Account Number: [id number] Date of Birth/Sex: Treating RN: [birth year] (66 y.o. [name] Primary Care Tayja Manzer: [name] Other Clinician: Referring Eldoris Beiser: Treating Kali Deadwyler/Extender: [name] in Treatment: 38 Edema Assessment Assessed: Shirlyn Goltz: No] Patrice Paradise: No] Edema: [Left: Yes] [Right: Yes] Calf Left: Right: Point of Measurement: 37 cm From Medial Instep 39.5 cm 39 cm Ankle Left: Right: Point of Measurement: 12 cm From Medial Instep 26 cm 26 cm Electronic Signature(s) Signed: 07/02/2022 6:12:04 PM By: [name] RN, BSN Entered By: [name] on 07/02/2022 13:10:00 -------------------------------------------------------------------------------- Multi Wound Chart Details [name], [name] 07/02/2022 12:45 PM Medical Record Number: [id number] Patient Account Number: [id number] Date of Birth/Sex: Treating RN: 1956/01/30 (66 y.o. [name] Primary Care Teodor Prater: [name] Other Clinician: Referring Karolyne Timmons: Treating Johnelle Tafolla/Extender:  [name] in Treatment: 97 Vital Signs Height(in): 77 Pulse(bpm): 37 Weight(lbs): 258 Blood Pressure(mmHg): 132/78 Body Mass Index(BMI): 30.6 Temperature(F): 98.9 Respiratory Rate(breaths/min): 16 [25:Photos:] Right, Medial Lower Leg Left, Posterior Lower Leg Right, Lateral Lower Leg Wound Location: Gradually Appeared Gradually Appeared Gradually Appeared Wounding Event: Venous Leg Ulcer Venous Leg Ulcer Venous Leg Ulcer Primary Etiology: Anemia, Sleep Apnea, Congestive Anemia, Sleep Apnea, Congestive Anemia, Sleep Apnea, Congestive Comorbid History: Heart Failure, Coronary Artery Heart Failure, Coronary Artery Heart Failure, Coronary Artery Disease, Hypertension, Peripheral Disease, Hypertension, Peripheral Disease, Hypertension, Peripheral Denherder, Vincente ([id number])  [phone].pdf Page 3 of 10 Venous Disease, Type II Diabetes, Venous Disease, Type II Diabetes, Venous Disease, Type II Diabetes, Osteoarthritis, Neuropathy Osteoarthritis, Neuropathy Osteoarthritis, Neuropathy 09/07/2021 09/07/2021 04/30/2022 Date Acquired: 49 38 9 Weeks of Treatment: Open Open Open Wound Status: No No No Wound Recurrence: 5.7x2.2x0.2 14x15.8x0.3 1.3x1.2x0.3 Measurements L x W x D (cm) 9.849 173.73 1.225 A (cm) : rea 1.97 52.119 0.368 Volume (cm) : 18.00% 33.00% -102.50% % Reduction in A rea: 45.40% 33.00% -204.10% % Reduction in Volume: Full Thickness Without Exposed Full Thickness Without Exposed Full Thickness Without Exposed Classification: Support Structures Support Structures Support Structures Medium Medium Medium Exudate A mount: Serosanguineous Serosanguineous Serosanguineous Exudate Type: red, brown red, brown red, brown Exudate Color: Thickened Distinct, outline attached Distinct, outline attached Wound Margin: Large (67-100%) Large (67-100%) Medium (34-66%) Granulation A mount: Red, Pink Red, Friable Red,  Pink Granulation Quality: None Present (0%) None Present (0%) Medium (34-66%) Necrotic A mount: Fat Layer (Subcutaneous Tissue): Yes Fat Layer (Subcutaneous Tissue): Yes Fat Layer (Subcutaneous Tissue): Yes Exposed Structures: Fascia: No Fascia: No Fascia: No Tendon: No Tendon: No Tendon: No Muscle: No Muscle: No Muscle: No Joint: No Joint: No Joint: No Bone: No Bone: No Bone: No Small (1-33%) Small (1-33%) None Epithelialization: Debridement - Excisional Debridement - Excisional Debridement - Excisional Debridement: Pre-procedure Verification/Time Out 13:25 13:25 13:25 Taken: Lidocaine 4% Topical Solution Lidocaine 4% Topical Solution Lidocaine 4% Topical Solution Pain Control: Subcutaneous, Slough Subcutaneous, Slough Subcutaneous, Slough Tissue Debrided: Skin/Subcutaneous Tissue Skin/Subcutaneous Tissue Skin/Subcutaneous Tissue Level: 12.54 140 1.56 Debridement A (sq cm): rea Curette Curette Curette Instrument: Minimum Minimum Minimum Bleeding: Pressure Pressure Pressure Hemostasis A chieved: 0 0 0 Procedural Pain: 0 0 0 Post Procedural Pain: Procedure was tolerated well Procedure was tolerated well Procedure was tolerated well Debridement Treatment Response: 5.7x2.2x0.2 14x15.8x0.3 1.3x1.2x0.3 Post Debridement Measurements L x W x D (cm) 1.97 52.119 0.368 Post Debridement Volume: (cm) Excoriation: No Excoriation: No Excoriation: No Periwound Skin Texture: Induration: No Induration: No Induration: No Callus: No Callus: No Callus: No Crepitus: No Crepitus: No Crepitus: No Rash: No Rash: No Rash: No Scarring: No Scarring: No Scarring: No Maceration: No Maceration: No Maceration: No Periwound Skin Moisture: Dry/Scaly: No Dry/Scaly: No Dry/Scaly: No Atrophie Blanche: No Atrophie Blanche: No Atrophie Blanche: No Periwound Skin Color: Cyanosis: No Cyanosis: No Cyanosis: No Ecchymosis: No Ecchymosis: No Ecchymosis: No Erythema:  No Erythema: No Erythema: No Hemosiderin Staining: No Hemosiderin Staining: No Hemosiderin Staining: No Mottled: No Mottled: No Mottled: No Pallor: No Pallor: No Pallor: No Rubor: No Rubor: No Rubor: No Debridement N/A N/A Procedures Performed: Treatment Notes Electronic Signature(s) Signed: 07/02/2022 4:32:14 PM By: [name] DO Signed: 07/02/2022 6:12:04 PM By: [name] RN, BSN Entered By: [name] on 07/02/2022 13:27:16 -------------------------------------------------------------------------------- Multi-Disciplinary Care Plan Details [name], [name] 07/02/2022 12:45 PM Medical Record Number: [id number] Patient Account Number: [id number] Date of Birth/Sex: Treating RN: 08/13/1956 (66 y.o. [name] Primary Care Cas Tracz: [name] Other Clinician: Vora, Stetson ([id number]) [phone].pdf Page 4 of 10 Referring Avonelle Viveros: Treating Jansel Vonstein/Extender: [name] in Treatment: 38 Multidisciplinary Care Plan reviewed with physician Active Inactive Venous Leg Ulcer Nursing Diagnoses: Actual venous Insuffiency (use after diagnosis is confirmed) Goals: Patient will maintain optimal edema control Date Initiated: 10/08/2021 Target Resolution Date: 07/19/2022 Goal Status: Active Interventions: Compression as ordered Provide education on venous insufficiency Treatment Activities: Therapeutic compression applied : 10/08/2021 Notes: Wound/Skin Impairment Nursing Diagnoses: Impaired tissue integrity Goals: Patient/caregiver will  verbalize understanding of skin care regimen Date Initiated: 10/08/2021 Target Resolution Date: 07/19/2022 Goal Status: Active Ulcer/skin breakdown will have a volume reduction of 30% by week 4 Date Initiated: 10/08/2021 Date Inactivated: 11/05/2021 Target Resolution Date: 11/05/2021 Goal Status: Unmet Unmet Reason: infection Ulcer/skin  breakdown will have a volume reduction of 50% by week 8 Date Initiated: 11/05/2021 Date Inactivated: 06/04/2022 Target Resolution Date: 05/25/2022 Goal Status: Met Interventions: Assess patient/caregiver ability to perform ulcer/skin care regimen upon admission and as needed Assess ulceration(s) every visit Provide education on ulcer and skin care Treatment Activities: Topical wound management initiated : 10/08/2021 Notes: Electronic Signature(s) Signed: 07/02/2022 6:12:04 PM By: [name] RN, BSN Entered By: [name] on 07/02/2022 13:22:47 -------------------------------------------------------------------------------- Pain Assessment Details [name], [name] 07/02/2022 12:45 PM Medical Record Number: [id number] Patient Account Number: [id number] Date of Birth/Sex: Treating RN: [birth year] (66 y.o. [name] Primary Care Ardythe Klute: [name] Other Clinician: Referring Hilario Robarts: Treating Harla Mensch/Extender: [name] in Treatment: 36 Active Problems Revell, Buren ([id number]) [id number] Page 5 of 10 Location of Pain Severity and Description of Pain Patient Has Paino Yes Site Locations Pain Location: Pain in Ulcers With Dressing Change: Yes Duration of the Pain. Constant / Intermittento Intermittent Rate the pain. Current Pain Level: 5 Character of Pain Describe the Pain: Aching, Burning, Tender Pain Management and Medication Current Pain Management: Medication: Yes Cold Application: No Rest: Yes Massage: No Activity: No T.E.N.S.: No Heat Application: No Leg drop or elevation: No Is the Current Pain Management Adequate: Adequate How does your wound impact your activities of daily livingo Sleep: No Bathing: No Appetite: No Relationship With Others: No Bladder Continence: No Emotions: No Bowel Continence: No Work: No Toileting: No Drive: No Dressing: No Hobbies:  No Electronic Signature(s) Signed: 07/02/2022 5:19:10 PM By: Lorrin Jackson Entered By: Lorrin Jackson on 07/02/2022 12:51:40 -------------------------------------------------------------------------------- Wound Assessment Details [name], [name] 07/02/2022 12:45 PM Medical Record Number: [id number] Patient Account Number: [id number] Date of Birth/Sex: Treating RN: 01-26-1956 (66 y.o. [name] Primary Care Joncarlos Atkison: [name] Other Clinician: Referring Clever Geraldo: Treating [place]a Throckmorton/Extender: [name] in Treatment: 38 Wound Status Wound Number: 25 Primary Venous Leg Ulcer Etiology: Wound Location: Right, Medial Lower Leg Wound Open Wounding Event: Gradually Appeared Status: Date Acquired: 09/07/2021 Comorbid Anemia, Sleep Apnea, Congestive Heart Failure, Coronary Artery Weeks Of Treatment: 38 History: Disease, Hypertension, Peripheral Venous Disease, Type II Clustered Wound: No Diabetes, Osteoarthritis, Neuropathy Photos Rhymes, Fread ([id number]) [id number] Page 6 of 10 Wound Measurements Length: (cm) 5.7 Width: (cm) 2.2 Depth: (cm) 0.2 Area: (cm) 9.849 Volume: (cm) 1.97 % Reduction in Area: 18% % Reduction in Volume: 45.4% Epithelialization: Small (1-33%) Tunneling: No Undermining: No Wound Description Classification: Full Thickness Without Exposed Support Structures Wound Margin: Thickened Exudate Amount: Medium Exudate Type: Serosanguineous Exudate Color: red, brown Foul Odor After Cleansing: No Slough/Fibrino No Wound Bed Granulation Amount: Large (67-100%) Exposed Structure Granulation Quality: Red, Pink Fascia Exposed: No Necrotic Amount: None Present (0%) Fat Layer (Subcutaneous Tissue) Exposed: Yes Tendon Exposed: No Muscle Exposed: No Joint Exposed: No Bone Exposed: No Periwound Skin Texture Texture Color No Abnormalities Noted: No No Abnormalities  Noted: No Callus: No Atrophie Blanche: No Crepitus: No Cyanosis: No Excoriation: No Ecchymosis: No Induration: No Erythema: No Rash: No Hemosiderin Staining: No Scarring: No Mottled: No Pallor: No Moisture Rubor: No No Abnormalities Noted: No Dry / Scaly: No Maceration: No Treatment Notes Wound #25 (Lower  Leg) Wound Laterality: Right, Medial Cleanser Soap and Water Discharge Instruction: May shower and wash wound with dial antibacterial soap and water prior to dressing change. Wound Cleanser Discharge Instruction: Cleanse the wound with wound cleanser prior to applying a clean dressing using gauze sponges, not tissue or cotton balls. Peri-Wound Care Zinc Oxide Ointment 30g tube Discharge Instruction: Apply Zinc Oxide to periwound with each dressing change Sween Lotion (Moisturizing lotion) Discharge Instruction: Apply moisturizing lotion as directed Topical Primary Dressing Maxorb Extra Calcium Alginate Dressing, 4x4 in Discharge Instruction: Apply calcium alginate over the cutimed to help absorb drainage. Cutimed Sorbact Swab Discharge Instruction: Apply to wound bed as instructed [place], Kalei ([id number]) [id number] Page 7 of 10 Keystone Antibiotic spray Secondary Dressing ABD Pad, 8x10 Discharge Instruction: Apply over primary dressing as directed. Secured With Compression Wrap Kerlix Roll 4.5x3.1 (in/yd) Discharge Instruction: Apply Kerlix and Coban compression as directed. Coban Self-Adherent Wrap 4x5 (in/yd) Discharge Instruction: Apply over Kerlix as directed. Compression Stockings Add-Ons Electronic Signature(s) Signed: 07/02/2022 6:12:04 PM By: [name] RN, BSN Entered By: [name] on 07/02/2022 13:12:32 -------------------------------------------------------------------------------- Wound Assessment Details [name], [name] 07/02/2022 12:45 PM Medical Record Number: [id number] Patient  Account Number: [id number] Date of Birth/Sex: Treating RN: 16-Dec-1955 (66 y.o. [name] Primary Care Craigory Toste: [name] Other Clinician: Referring Manjinder Breau: Treating Verlia Kaney/Extender: [name] in Treatment: 38 Wound Status Wound Number: 27 Primary Venous Leg Ulcer Etiology: Wound Location: Left, Posterior Lower Leg Wound Open Wounding Event: Gradually Appeared Status: Date Acquired: 09/07/2021 Comorbid Anemia, Sleep Apnea, Congestive Heart Failure, Coronary Artery Weeks Of Treatment: 38 History: Disease, Hypertension, Peripheral Venous Disease, Type II Clustered Wound: No Diabetes, Osteoarthritis, Neuropathy Photos Wound Measurements Length: (cm) 14 Width: (cm) 15.8 Depth: (cm) 0.3 Area: (cm) 173.73 Volume: (cm) 52.119 % Reduction in Area: 33% % Reduction in Volume: 33% Epithelialization: Small (1-33%) Tunneling: No Undermining: No Wound Description Classification: Full Thickness Without Exposed Support Structures Wound Margin: Distinct, outline attached Exudate Amount: Medium Exudate Type: Serosanguineous Exudate Color: red, brown Isip, Jameer ([id number]) Foul Odor After Cleansing: No Slough/Fibrino No [id number] Page 8 of 10 Wound Bed Granulation Amount: Large (67-100%) Exposed Structure Granulation Quality: Red, Friable Fascia Exposed: No Necrotic Amount: None Present (0%) Fat Layer (Subcutaneous Tissue) Exposed: Yes Tendon Exposed: No Muscle Exposed: No Joint Exposed: No Bone Exposed: No Periwound Skin Texture Texture Color No Abnormalities Noted: No No Abnormalities Noted: No Callus: No Atrophie Blanche: No Crepitus: No Cyanosis: No Excoriation: No Ecchymosis: No Induration: No Erythema: No Rash: No Hemosiderin Staining: No Scarring: No Mottled: No Pallor: No Moisture Rubor: No No Abnormalities Noted: No Dry / Scaly: No Maceration: No Treatment Notes Wound #27 (Lower  Leg) Wound Laterality: Left, Posterior Cleanser Soap and Water Discharge Instruction: May shower and wash wound with dial antibacterial soap and water prior to dressing change. Wound Cleanser Discharge Instruction: Cleanse the wound with wound cleanser prior to applying a clean dressing using gauze sponges, not tissue or cotton balls. Peri-Wound Care Zinc Oxide Ointment 30g tube Discharge Instruction: Apply Zinc Oxide to periwound with each dressing change Sween Lotion (Moisturizing lotion) Discharge Instruction: Apply moisturizing lotion as directed Topical Primary Dressing Maxorb Extra Calcium Alginate Dressing, 4x4 in Discharge Instruction: Apply calcium alginate over the cutimed to help absorb drainage. Cutimed Sorbact Swab Discharge Instruction: Apply to wound bed as instructed Keystone Antibiotic spray Secondary Dressing ABD Pad, 8x10 Discharge Instruction: Apply over primary dressing as directed. Secured With Compression Wrap Kerlix Roll  4.5x3.1 (in/yd) Discharge Instruction: Apply Kerlix and Coban compression as directed. Coban Self-Adherent Wrap 4x5 (in/yd) Discharge Instruction: Apply over Kerlix as directed. Compression Stockings Add-Ons Electronic Signature(s) Signed: 07/02/2022 6:12:04 PM By: [name] RN, BSN Entered By: [name] on 07/02/2022 13:11:37 Thibault, Tyquez ([id number]) [id number] Page 9 of 10 -------------------------------------------------------------------------------- Wound Assessment Details [name], [name] 07/02/2022 12:45 PM Medical Record Number: [id number] Patient Account Number: [id number] Date of Birth/Sex: Treating RN: Nov 18, 1955 (66 y.o. [name] Primary Care Arshia Spellman: [name] Other Clinician: Referring Travers Goodley: Treating Bralynn Velador/Extender: [name] in Treatment: 38 Wound Status Wound Number: 29 Primary Venous Leg  Ulcer Etiology: Wound Location: Right, Lateral Lower Leg Wound Open Wounding Event: Gradually Appeared Status: Date Acquired: 04/30/2022 Comorbid Anemia, Sleep Apnea, Congestive Heart Failure, Coronary Artery Weeks Of Treatment: 9 History: Disease, Hypertension, Peripheral Venous Disease, Type II Clustered Wound: No Diabetes, Osteoarthritis, Neuropathy Photos Wound Measurements Length: (cm) 1.3 Width: (cm) 1.2 Depth: (cm) 0.3 Area: (cm) 1.225 Volume: (cm) 0.368 % Reduction in Area: -102.5% % Reduction in Volume: -204.1% Epithelialization: None Tunneling: No Undermining: No Wound Description Classification: Full Thickness Without Exposed Suppor Wound Margin: Distinct, outline attached Exudate Amount: Medium Exudate Type: Serosanguineous Exudate Color: red, brown t Structures Foul Odor After Cleansing: No Slough/Fibrino Yes Wound Bed Granulation Amount: Medium (34-66%) Exposed Structure Granulation Quality: Red, Pink Fascia Exposed: No Necrotic Amount: Medium (34-66%) Fat Layer (Subcutaneous Tissue) Exposed: Yes Necrotic Quality: Adherent Slough Tendon Exposed: No Muscle Exposed: No Joint Exposed: No Bone Exposed: No Periwound Skin Texture Texture Color No Abnormalities Noted: No No Abnormalities Noted: No Callus: No Atrophie Blanche: No Crepitus: No Cyanosis: No Excoriation: No Ecchymosis: No Induration: No Erythema: No Rash: No Hemosiderin Staining: No Scarring: No Mottled: No Pallor: No Moisture Rubor: No No Abnormalities Noted: No Dry / Scaly: No Maceration: No Treatment Notes Churchman, Yuvraj ([id number]) [id number] Page 10 of 10 Wound #29 (Lower Leg) Wound Laterality: Right, Lateral Cleanser Soap and Water Discharge Instruction: May shower and wash wound with dial antibacterial soap and water prior to dressing change. Wound Cleanser Discharge Instruction: Cleanse the wound with wound cleanser prior to applying a clean  dressing using gauze sponges, not tissue or cotton balls. Peri-Wound Care Zinc Oxide Ointment 30g tube Discharge Instruction: Apply Zinc Oxide to periwound with each dressing change Sween Lotion (Moisturizing lotion) Discharge Instruction: Apply moisturizing lotion as directed Topical Primary Dressing Maxorb Extra Calcium Alginate Dressing, 4x4 in Discharge Instruction: Apply calcium alginate over the cutimed to help absorb drainage. Cutimed Sorbact Swab Discharge Instruction: Apply to wound bed as instructed Keystone Antibiotic spray Secondary Dressing ABD Pad, 8x10 Discharge Instruction: Apply over primary dressing as directed. Secured With Compression Wrap Kerlix Roll 4.5x3.1 (in/yd) Discharge Instruction: Apply Kerlix and Coban compression as directed. Coban Self-Adherent Wrap 4x5 (in/yd) Discharge Instruction: Apply over Kerlix as directed. Compression Stockings Add-Ons Electronic Signature(s) Signed: 07/02/2022 6:12:04 PM By: [name] RN, BSN Entered By: [name] on 07/02/2022 13:13:05 -------------------------------------------------------------------------------- Vitals Details [name], [name] 07/02/2022 12:45 PM Medical Record Number: [id number] Patient Account Number: [id number] Date of Birth/Sex: Treating RN: March 08, 1956 (66 y.o. [name] Primary Care Rodrick Payson: [name] Other Clinician: Referring Quanta Robertshaw: Treating Ferman Basilio/Extender: [name] in Treatment: 38 Vital Signs Time Taken: 12:50 Temperature (F): 98.9 Height (in): 77 Pulse (bpm): 85 Weight (lbs): 258 Respiratory Rate (breaths/min): 16 Body Mass Index (BMI): 30.6 Blood Pressure (mmHg): 132/78 Reference Range: 80 -  120 mg / dl Electronic Signature(s) Signed: 07/02/2022 5:19:10 PM By: Lorrin Jackson Entered By: Lorrin Jackson on 07/02/2022 12:51:15

## 2022-07-24 DIAGNOSIS — N179 Acute kidney failure, unspecified: Secondary | ICD-10-CM | POA: Diagnosis not present

## 2022-07-24 DIAGNOSIS — M6281 Muscle weakness (generalized): Secondary | ICD-10-CM | POA: Diagnosis not present

## 2022-07-24 DIAGNOSIS — R2689 Other abnormalities of gait and mobility: Secondary | ICD-10-CM | POA: Diagnosis not present

## 2022-07-25 DIAGNOSIS — M150 Primary generalized (osteo)arthritis: Secondary | ICD-10-CM | POA: Diagnosis not present

## 2022-07-25 DIAGNOSIS — R2689 Other abnormalities of gait and mobility: Secondary | ICD-10-CM | POA: Diagnosis not present

## 2022-07-25 DIAGNOSIS — M6281 Muscle weakness (generalized): Secondary | ICD-10-CM | POA: Diagnosis not present

## 2022-07-25 DIAGNOSIS — N179 Acute kidney failure, unspecified: Secondary | ICD-10-CM | POA: Diagnosis not present

## 2022-07-26 DIAGNOSIS — R2689 Other abnormalities of gait and mobility: Secondary | ICD-10-CM | POA: Diagnosis not present

## 2022-07-26 DIAGNOSIS — N179 Acute kidney failure, unspecified: Secondary | ICD-10-CM | POA: Diagnosis not present

## 2022-07-26 DIAGNOSIS — M6281 Muscle weakness (generalized): Secondary | ICD-10-CM | POA: Diagnosis not present

## 2022-07-29 DIAGNOSIS — I10 Essential (primary) hypertension: Secondary | ICD-10-CM | POA: Diagnosis not present

## 2022-07-29 DIAGNOSIS — M6281 Muscle weakness (generalized): Secondary | ICD-10-CM | POA: Diagnosis not present

## 2022-07-29 DIAGNOSIS — Z79899 Other long term (current) drug therapy: Secondary | ICD-10-CM | POA: Diagnosis not present

## 2022-07-29 DIAGNOSIS — E119 Type 2 diabetes mellitus without complications: Secondary | ICD-10-CM | POA: Diagnosis not present

## 2022-07-29 DIAGNOSIS — E440 Moderate protein-calorie malnutrition: Secondary | ICD-10-CM | POA: Diagnosis not present

## 2022-07-29 DIAGNOSIS — R2689 Other abnormalities of gait and mobility: Secondary | ICD-10-CM | POA: Diagnosis not present

## 2022-07-29 DIAGNOSIS — I5040 Unspecified combined systolic (congestive) and diastolic (congestive) heart failure: Secondary | ICD-10-CM | POA: Diagnosis not present

## 2022-07-29 DIAGNOSIS — N179 Acute kidney failure, unspecified: Secondary | ICD-10-CM | POA: Diagnosis not present

## 2022-07-30 ENCOUNTER — Encounter (HOSPITAL_BASED_OUTPATIENT_CLINIC_OR_DEPARTMENT_OTHER): Payer: Medicaid Other | Admitting: Internal Medicine

## 2022-07-30 DIAGNOSIS — R2689 Other abnormalities of gait and mobility: Secondary | ICD-10-CM | POA: Diagnosis not present

## 2022-07-30 DIAGNOSIS — N179 Acute kidney failure, unspecified: Secondary | ICD-10-CM | POA: Diagnosis not present

## 2022-07-30 DIAGNOSIS — M6281 Muscle weakness (generalized): Secondary | ICD-10-CM | POA: Diagnosis not present

## 2022-07-31 DIAGNOSIS — R2689 Other abnormalities of gait and mobility: Secondary | ICD-10-CM | POA: Diagnosis not present

## 2022-07-31 DIAGNOSIS — N179 Acute kidney failure, unspecified: Secondary | ICD-10-CM | POA: Diagnosis not present

## 2022-07-31 DIAGNOSIS — M6281 Muscle weakness (generalized): Secondary | ICD-10-CM | POA: Diagnosis not present

## 2022-08-01 DIAGNOSIS — N179 Acute kidney failure, unspecified: Secondary | ICD-10-CM | POA: Diagnosis not present

## 2022-08-01 DIAGNOSIS — R2689 Other abnormalities of gait and mobility: Secondary | ICD-10-CM | POA: Diagnosis not present

## 2022-08-01 DIAGNOSIS — M6281 Muscle weakness (generalized): Secondary | ICD-10-CM | POA: Diagnosis not present

## 2022-08-02 DIAGNOSIS — M6281 Muscle weakness (generalized): Secondary | ICD-10-CM | POA: Diagnosis not present

## 2022-08-02 DIAGNOSIS — R2689 Other abnormalities of gait and mobility: Secondary | ICD-10-CM | POA: Diagnosis not present

## 2022-08-02 DIAGNOSIS — N179 Acute kidney failure, unspecified: Secondary | ICD-10-CM | POA: Diagnosis not present

## 2022-08-05 DIAGNOSIS — N179 Acute kidney failure, unspecified: Secondary | ICD-10-CM | POA: Diagnosis not present

## 2022-08-05 DIAGNOSIS — I5040 Unspecified combined systolic (congestive) and diastolic (congestive) heart failure: Secondary | ICD-10-CM | POA: Diagnosis not present

## 2022-08-05 DIAGNOSIS — L97919 Non-pressure chronic ulcer of unspecified part of right lower leg with unspecified severity: Secondary | ICD-10-CM | POA: Diagnosis not present

## 2022-08-05 DIAGNOSIS — I10 Essential (primary) hypertension: Secondary | ICD-10-CM | POA: Diagnosis not present

## 2022-08-05 DIAGNOSIS — Z79899 Other long term (current) drug therapy: Secondary | ICD-10-CM | POA: Diagnosis not present

## 2022-08-05 DIAGNOSIS — R2689 Other abnormalities of gait and mobility: Secondary | ICD-10-CM | POA: Diagnosis not present

## 2022-08-05 DIAGNOSIS — E119 Type 2 diabetes mellitus without complications: Secondary | ICD-10-CM | POA: Diagnosis not present

## 2022-08-05 DIAGNOSIS — E440 Moderate protein-calorie malnutrition: Secondary | ICD-10-CM | POA: Diagnosis not present

## 2022-08-05 DIAGNOSIS — M6281 Muscle weakness (generalized): Secondary | ICD-10-CM | POA: Diagnosis not present

## 2022-08-06 ENCOUNTER — Encounter (HOSPITAL_BASED_OUTPATIENT_CLINIC_OR_DEPARTMENT_OTHER): Payer: Medicare Other | Attending: Internal Medicine | Admitting: Internal Medicine

## 2022-08-06 DIAGNOSIS — E440 Moderate protein-calorie malnutrition: Secondary | ICD-10-CM | POA: Insufficient documentation

## 2022-08-06 DIAGNOSIS — L97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ed: Secondary | ICD-10-CM | POA: Diagnosis not present

## 2022-08-06 DIAGNOSIS — I890 Lymphedema, not elsewhere classified: Secondary | ICD-10-CM | POA: Diagnosis not present

## 2022-08-06 DIAGNOSIS — I110 Hypertensive heart disease with heart failure: Secondary | ICD-10-CM | POA: Insufficient documentation

## 2022-08-06 DIAGNOSIS — E11622 Type 2 diabetes mellitus with other skin ulcer: Secondary | ICD-10-CM | POA: Diagnosis not present

## 2022-08-06 DIAGNOSIS — E1151 Type 2 diabetes mellitus with diabetic peripheral angiopathy without gangrene: Secondary | ICD-10-CM | POA: Insufficient documentation

## 2022-08-06 DIAGNOSIS — F17210 Nicotine dependence, cigarettes, uncomplicated: Secondary | ICD-10-CM | POA: Diagnosis not present

## 2022-08-06 DIAGNOSIS — I5032 Chronic diastolic (congestive) heart failure: Secondary | ICD-10-CM | POA: Insufficient documentation

## 2022-08-06 DIAGNOSIS — M6281 Muscle weakness (generalized): Secondary | ICD-10-CM | POA: Diagnosis not present

## 2022-08-06 DIAGNOSIS — R2689 Other abnormalities of gait and mobility: Secondary | ICD-10-CM | POA: Diagnosis not present

## 2022-08-06 DIAGNOSIS — I87333 Chronic venous hypertension (idiopathic) with ulcer and inflammation of bilateral lower extremity: Secondary | ICD-10-CM | POA: Insufficient documentation

## 2022-08-06 DIAGNOSIS — N179 Acute kidney failure, unspecified: Secondary | ICD-10-CM | POA: Diagnosis not present

## 2022-08-06 DIAGNOSIS — L97912 Non-pressure chronic ulcer of unspecified part of right lower leg with fat layer exposed: Secondary | ICD-10-CM | POA: Diagnosis not present

## 2022-08-07 DIAGNOSIS — M6281 Muscle weakness (generalized): Secondary | ICD-10-CM | POA: Diagnosis not present

## 2022-08-07 DIAGNOSIS — R2689 Other abnormalities of gait and mobility: Secondary | ICD-10-CM | POA: Diagnosis not present

## 2022-08-07 DIAGNOSIS — N179 Acute kidney failure, unspecified: Secondary | ICD-10-CM | POA: Diagnosis not present

## 2022-08-07 NOTE — Progress Notes (Signed)
[name], [name] ([id number]) [id number] Page 1 of 16 Visit Report for 08/06/2022 Chief Complaint Document Details [name], [name] 08/06/2022 12:45 PM Medical Record Number: [id number] Patient Account Number: [id number] Date of Birth/Sex: Treating RN: [birth year] (66 y.o. M) Primary Care Provider: [name] Other Clinician: Referring Provider: Treating Provider/Extender: [name] in Treatment: 17 Information Obtained from: Patient Chief Complaint 10/08/2021; bilateral lower extremity wounds Electronic Signature(s) Signed: 08/06/2022 2:06:49 PM By: [name] DO Entered By: [name] on 08/06/2022 13:56:08 -------------------------------------------------------------------------------- Debridement Details [name], [name] 08/06/2022 12:45 PM Medical Record Number: [id number] Patient Account Number: [id number] Date of Birth/Sex: Treating RN: 1956-05-08 (66 y.o. [name], Meta.Reding Primary Care Provider: [name] Other Clinician: Referring Provider: Treating Provider/Extender: [name] in Treatment: 43 Debridement Performed for Assessment: Wound #25 Right,Medial Lower Leg Performed By: Physician [name], DO Debridement Type: Debridement Severity of Tissue Pre Debridement: Fat layer exposed Level of Consciousness (Pre-procedure): Awake and Alert Pre-procedure Verification/Time Out Yes - 13:15 Taken: Start Time: 13:16 Pain Control: Lidocaine 4% T opical Solution T Area Debrided (L x W): otal 5.5 (cm) x 2.7 (cm) = 14.85 (cm) Tissue and other material debrided: Viable, Non-Viable, Slough, Subcutaneous, Slough Level: Skin/Subcutaneous Tissue Debridement Description: Excisional Instrument: Curette Bleeding: Minimum Hemostasis Achieved: Pressure End Time: 13:24 Procedural Pain: 0 Post Procedural Pain: 0 Response  to Treatment: Procedure was tolerated well Level of Consciousness (Post- Awake and Alert procedure): Post Debridement Measurements of Total Wound Length: (cm) 5.5 Width: (cm) 2.7 Depth: (cm) 0.2 Volume: (cm) 2.333 Character of Wound/Ulcer Post Debridement: Improved Severity of Tissue Post Debridement: Fat layer exposed [name], [name] ([id number]) [id number] Page 2 of 16 Post Procedure Diagnosis Same as Pre-procedure Electronic Signature(s) Signed: 08/06/2022 2:06:49 PM By: [name] DO Signed: 08/06/2022 5:53:09 PM By: [name] RN, BSN Entered By: [name] on 08/06/2022 13:24:19 -------------------------------------------------------------------------------- Debridement Details [name], [name] 08/06/2022 12:45 PM Medical Record Number: [id number] Patient Account Number: [id number] Date of Birth/Sex: Treating RN: [birth year] (66 y.o. [name], Meta.Reding Primary Care Provider: [name] Other Clinician: Referring Provider: Treating Provider/Extender: [name] in Treatment: 43 Debridement Performed for Assessment: Wound #27 Left,Posterior Lower Leg Performed By: Physician [name], DO Debridement Type: Debridement Severity of Tissue Pre Debridement: Fat layer exposed Level of Consciousness (Pre-procedure): Awake and Alert Pre-procedure Verification/Time Out Yes - 13:15 Taken: Start Time: 13:16 Pain Control: Lidocaine 4% T opical Solution T Area Debrided (L x W): otal 19 (cm) x 17 (cm) = 323 (cm) Tissue and other material debrided: Viable, Non-Viable, Slough, Subcutaneous, Slough Level: Skin/Subcutaneous Tissue Debridement Description: Excisional Instrument: Curette Bleeding: Minimum Hemostasis Achieved: Pressure End Time: 13:24 Procedural Pain: 0 Post Procedural Pain: 0 Response to Treatment: Procedure was tolerated well Level of Consciousness (Post- Awake and  Alert procedure): Post Debridement Measurements of Total Wound Length: (cm) 19 Width: (cm) 17 Depth: (cm) 0.4 Volume: (cm) 101.473 Character of Wound/Ulcer Post Debridement: Improved Severity of Tissue Post Debridement: Fat layer exposed Post Procedure Diagnosis Same as Pre-procedure Electronic Signature(s) Signed: 08/06/2022 2:06:49 PM By: [name] DO Signed: 08/06/2022 5:53:09 PM By: [name] RN, BSN Entered By: [name] on 08/06/2022 13:24:42 Debridement Details -------------------------------------------------------------------------------- [name], [name] ([id number]) [id number] Page 3 of 16 [name], [name] 08/06/2022 12:45 PM Medical Record Number: [id number] Patient Account Number: [id number] Date of Birth/Sex: Treating RN: [birth year] [phone]  y.o. [name], Meta.Reding Primary Care Provider: [name] Other Clinician: Referring Provider: Treating Provider/Extender: [name] in Treatment: 43 Debridement Performed for Assessment: Wound #29 Right,Lateral Lower Leg Performed By: Physician [name], DO Debridement Type: Debridement Severity of Tissue Pre Debridement: Fat layer exposed Level of Consciousness (Pre-procedure): Awake and Alert Pre-procedure Verification/Time Out Yes - 13:15 Taken: Start Time: 13:16 Pain Control: Lidocaine 4% T opical Solution T Area Debrided (L x W): otal 1.2 (cm) x 1 (cm) = 1.2 (cm) Tissue and other material debrided: Viable, Non-Viable, Slough, Subcutaneous, Slough Level: Skin/Subcutaneous Tissue Debridement Description: Excisional Instrument: Curette Bleeding: Minimum Hemostasis Achieved: Pressure End Time: 13:24 Procedural Pain: 0 Post Procedural Pain: 0 Response to Treatment: Procedure was tolerated well Level of Consciousness (Post- Awake and Alert procedure): Post Debridement Measurements of Total Wound Length: (cm)  1.2 Width: (cm) 1 Depth: (cm) 0.3 Volume: (cm) 0.283 Character of Wound/Ulcer Post Debridement: Improved Severity of Tissue Post Debridement: Fat layer exposed Post Procedure Diagnosis Same as Pre-procedure Electronic Signature(s) Signed: 08/06/2022 2:06:49 PM By: [name] DO Signed: 08/06/2022 5:53:09 PM By: [name] RN, BSN Entered By: [name] on 08/06/2022 13:24:59 -------------------------------------------------------------------------------- HPI Details [name], [name] 08/06/2022 12:45 PM Medical Record Number: [id number] Patient Account Number: [id number] Date of Birth/Sex: Treating RN: 1955/10/04 (66 y.o. M) Primary Care Provider: [name] Other Clinician: Referring Provider: Treating Provider/Extender: [name] in Treatment: 57 History of Present Illness HPI Description: 07/07/15; this is a patient who is in hospital on 8/2 through 8/4. He had cellulitis and abscess of predominantly I think the left leg. He received IV antibiotics. Plain x-ray showed no osteomyelitis. An MRI of the left leg did not show osteomyelitis. Cultures showed no predominant organism. His hemoglobin A1c was 9.8. He has a history of venous stasis also peripheral vascular disease. He was discharged to [facility]. He really has extensive ulcerations on the left lateral leg including a major wound that communicates both posteriorly and superiorly w. When drainage coming out of 2 smaller areas. He has a smaller wound on the posterior medial left leg. He has more predominantly venous insufficiency wounds on predominantly the right medial leg above the ankle the right foot into sections. He has recently been put on Augmentin at the nursing home. Previous ABIs/arterial evaluation showed triphasic waves diffusely he does not have a major ischemic issue a left lower extremity venous duplex also exam showed no evidence  of a DVT on 6/21 07/21/15; the patient arrives with really no major change. Culture I did last time was negative. He has not had a follow-up MRI I ordered. The wounds are macerated covered with a thick gelatinous surface slough. There is necrotic subcutaneous tissue 08/04/15; the patient arrives with a considerable improvement in the majority of his wound area. The area on the left leg now has what looks to be a granulated base. Most of the wounds on the left leg required an aggressive surgical debridement to remove nonviable fibrinous eschar and subcutaneous tissue however after debridement most of this looks better. Although I had asked for repeat MRI of the left leg when he first came in here with grossly purulent material Luth, Jerell ([id number]) [id number] Page 4 of 16 coming out of his wounds, it does not appear that this is been done and nor do I actually feel that strongly about it right now. He has severe surrounding venous insufficiency and inflammation. I don't  believe he has significant PAD 08/25/15. In general there is still a considerable wound area here but with still extensive service adherent slough. The patient will not allow mechanical debridement due to pain. He did not tolerate Medihoney therefore we are left with Santyl for now. She would appear that he has severe surrounding venous stasis. There is no evidence of the infection that may have had something to do with the pathogenesis of these wounds 09/29/15; patient still has substantial wound area on the left leg with a cluster of several wounds on the lateral leg confluently on the left leg posteriorly and then a substantial wound on the medial leg. On the right leg a substantial wound medially and a small area on the right lateral foot. All of these underwent a substantial surgical debridement with curettes which she tolerated better than he has in the past. This started as a complex cellulitis in the  face of chronic venous insufficiency and inflammation. 10/13/15; substantial cluster of wounds on the lateral aspect of his left leg confluently around to the other side. Extensive surgical debridement to remove for redness surface slough nonviable subcutaneous tissue. This is not an improvement. Also substantial wound on the medial right leg which is largely unchanged. The etiology of this was felt to be a complex cellulitis in the summer of 2016 in the face of chronic venous insufficiency and inflammation. The patient states that the wound on the right medial leg has been there for years off and on. 10/20/15; we are able to start [facility] to these extensive wound areas left greater than right on Tuesday after I spoke to the wound care nurse at the facility. He arrives here today for extensive surgical debridement for the wounds on the lateral aspect of his left leg posterior left leg, this is almost circumferential. He has a large wound on the medial aspect of his right leg although he finds this too painful for debridement 10/27/15; I continue to bring this patient back for frequent debridement/weekly debridement severe. We have been using Hydrofera Blue. Unfortunately this has not really had any improvement. The debridement surgery difficult and painful for the patient 11/23/15; this patient spent a complex hospitalization admitted with acute kidney injury, anemia, cellulitis of the lower extremity, he was felt to have sepsis pathophysiology although his blood cultures were negative. He had plain x-rays of both legs that were negative for osseous abnormalities. He was seen by infectious disease and placed on a workup for vasculitis that was negative including a biopsy 4 all were consistent with stasis dermatitis. Vital broad- spectrum antibiotics he continued to spike fevers. Urine and chest x-ray were negative Dopplers on admission ruled out a DVT All antibiotics were stopped on . 2/17. Since  his return to [facility] skilled nursing facility I believe they have been using Xeroform 12/07/15 the patient arrives today with the area on his right medial leg actually looking quite stable. No debridement. The rest of his extensive wounds on the left lateral left posterior extending into the left medial leg all required extensive debridement. I think this is probably going to need to and up in the hands of plastic surgery. We'll attempt to change him back to [facility]. Arrange consultation with plastic surgery at [facility] for this almost circumferential wound on the left side 12/21/15 right leg covered in surface slough. This was debridement. Left leg extensive wounds all carefully examined. This is almost circumferential on the left side especially on the posterior calf. No  debridement is necessary. 01/04/16 the patient has been to [facility] plastic surgery. Unfortunately it doesn't look like they had any of the prior workup on this patient. They're in the middle of vascular workup. It sounds as though they're applying Hydrofera Blue at the nursing home. 01/22/16; the patient has been back to see [facility]. There apparently making plans to possibly do skin grafts over his large venous insufficiency ulcerations. The patient tells me that he goes to hematology in [facility] and variably uses the term platelets red cells and the description of his problem. 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 I do not see any major difference in the wound on the right medial leg and circumferentially across the left medial to left lateral leg. Did not attempt to debride these today. Readmission: 05/05/18 on evaluation today patient presents for reevaluation here in our clinic although I have previously seen him in the skilled nursing facility over the past year and a half when I was working in the skilled nursing facility realm instead of covering in the clinic. With  that being said during that time we had initiated most recently dressings with Hydrofera Blue Dressing along with the [employer] which had done very well for him. Much better than the Kerlex Coban wraps. With that being said the patient had been doing fairly well and was making progress when I last saw him. Upon evaluation today it appears that the wounds are actually a little bit worse than when I last saw him although definitely not dramatically so. There does not appear to be any evidence of infection which is good news. With that being said he has been tolerating the wraps without complication his left hip still gives him a lot of trouble which is his main issue as far as movement is concerned. Unbeknownst to me he has not had venous studies that I can find. He previously told me he had there were arterial studies noted back from 04/27/15 which showed that he had try phasic blood flow throughout and again his test appear to be completely normal as performed by Dr. Creig Hines. With that being said I cannot find where he had venous studies. The patient still states he thinks he did these definitely had no venous intervention at this point. Upon evaluation today it does not appear to me that the patient has any evidence of cellulitis. He does have some chronic venous insufficiency which I think has led to stasis dermatitis but again this is not appearing to be infected at this point. No fevers, chills, nausea, or vomiting noted at this time. READMISSION 06/30/2018 This is a now 66 year old man we have had in this clinic at multiple times in the past. Most recently he came in August and was seen by [facility] stone. It is not clear why he did not come back we asked him really did not get a straight answer. He is at [facility] skilled facility he is a man who has severe chronic venous insufficiency with lymphedema and has had severe wounds on his left greater than right leg. We had him here in 2016 and 17  ultimately referred him to [facility]. He had arterial studies and venous studies done at [facility] although I am not able to access these records I see they are actually done. He also had multiple biopsies that were negative for malignancy showing changes compatible with chronic inflammation. As I understand things in [place] they are using Iodoflex and Unna boots. I am not really sure how they are getting enough Iodoflex for the large wound area especially on his left calf. Nevertheless the wounds look better than when I saw this in 2017. There were plans for him to have plastic surgery in 2018 and I think he was actually prepped for surgery however he was ultimately denied because the patient was an active smoker. The patient is not systemically unwell. The wounds are painful however given the size especially on the left this is not surprising. ABIs in this clinic were 0.78 on the left and 0.91 on the right 07/13/2018; this is a patient with bilateral severe venous ulcers with secondary lymphedema. The wounds are on the right medial calf and a substantial part of the left posterior calf and some involvement medially and laterally on the left. We changed him to silver alginate under Unna boot's last time. The wounds actually look fairly satisfactory today. 08/14/2018; this is a patient with severe bilateral venous stasis ulcers with secondary lymphedema left greater than right he is at [facility] using silver alginate under Unna boot's I do not think there is much change on this visit versus last time I saw him a month ago Readmission: 11/04/18 patient presents today for follow-up concerning his bilateral lower extremity ulcers. His a right medial ankle ulcer and a left leg that has ulcers over a large proportion of the surface area between the ankle and knee. Unfortunately this does cause some discomfort for the patient although it doesn't seem to be as uncomfortable as it has been in the  past. He is seen today for evaluation after a referral by [place] back to the clinic. Unfortunately has a lot of Slough noted on the surface of his wounds. He's been treated currently it sounds like with Dakin's soaked gauze and they have not been performing the Unna Boot wrap that was previously recommended. I'm not exactly sure what the reason for the change was. He does have less slough on the surface of the wound but again this is something that is often a constant issue for him. Again he's had these wounds for many years. I've known him for two years at least during that time when I was taking care of them in the facility he is Artie had the wounds for several years prior. READMISSION 02/25/2020 Josepha Pigg is a now 66 year old man. He has been in our clinic several times before most extensively in October 2016 through May 2017. At that time he had absolutely substantial bilateral lower extremity wounds secondary to chronic venous insufficiency and lymphedema. We ultimately referred him to [facility] he had a series of biopsies only showed suggestion of wound secondary to chronic venous insufficiency. He had a less extensive stay in our clinic in 2019 and then a single visit in February 2020. He is a resident at [facility] skilled facility. I think he has had intermittently had in facility wound care. He returns today. They are using silver alginate on the wounds although I am not sure what type of compression. Previously he has favored Unna boots. He is not felt to have an arterial issue. Past medical history; lymphedema and chronic venous insufficiency, diabetes mellitus, hypertension, congestive heart failure Utecht, Zyshonne ([id number]) [id number] Page 5 of 16 We did not do arterial studies today 04/27/2020 on evaluation patient appears to be doing really about the same as when I have known him and seen him in the past. He  does have wounds on the right and left legs at  this point. Fortunately there is no signs of active infection at this time. 9/2; 1 month follow-up. This is a man with severe chronic venous insufficiency and lymphedema. When I first met him he had horrible circumferential bilateral lower extremity ulcers. We have been using silver alginate up until early last month when it was changed to [facility] and Unna boot's more or less says maintenance dressings. Since the last time he was here the facility where he resides [facility] called to report they could no longer do Unna boot so he is apparently only been receiving kerlix Coban the left leg is certainly a lot worse with almost circumferential large wounds especially lateral but now medial and posterior as well. He has a standard same looking wound on the right medial lower leg. 10/1; absolutely no improvement here. In fact I do not think anything much is changed. The substantial area on his left lateral and left posterior calf is still open may be slightly larger. He has a more modest wound on the right medial calf. None of these look any different. He comes in with a kerlix Coban wrap this will simply not be adequate. He has too much edema in this leg He also is complaining a lot of pain in his left heel area. 11/8; potential wounds on the left lateral and posterior calf and a smaller oval wound in the right medial. We have been using Hydrofera Blue under 4-layer compression. He was using Unna boots still 2 months ago the facility called to say they can no longer place these so we have been using 4-layer compression. The surface area of the area on the left is so large there is very few alternatives to what we can use on this either silver alginate or Hydrofera Blue. He was complaining of pain on the left heel last time I asked for an x-ray this was apparently done although we do not have the result. He is not complaining of pain today. 08/16/2020 on evaluation today patient is is seen for a  early visit due to the fact that he apparently is having issues I was told when they called on Monday with a MRSA infection that they felt like we needed to see him for because his legs "looked horrible". With that being said based on what I see at this point it does not appear that the patient is actually having a terrible infection in fact compared to last time I saw him his legs do not appear to be doing too poorly at all at this time. Nonetheless he has been on doxycycline that is not going to be a good option for him based on the culture report that I have for review today which graded as a partial report with still several organisms pending as far as identification is concerned we did contact them but again they do not have the final report yet. Either way based on what we see it appears that doxycycline is one of the few medications that will not work for the Citrobacter. Obviously I think that a different medication would be a good option for him since there is a gram-negative organism pending I am likely can I suggest Cipro as the best of backup antibiotic to switch him to at this point. All this was discussed with the patient today. 12/20; not much change in the substantial wound on the left posterior calf and the small area on  the right medial. He has a new area on the left anterior which looks like a wrap injury. Had some thoughts about doing a deep tissue culture here although we will put that off for next time. Using silver alginate as a primary dressing 2/10; substantial area on the left posterior calf. This measures smaller but still is a very large area. He has the area on the right anterior medial which also is a fairly large wound but much smaller than not on the left. His edema control is quite good. We have been using silver alginate because of the size of the wounds. 4/7; substantial wound on the left posterior calf and a smaller area on the right medial lower leg. These are very  chronic wounds which we have classified is venous. Previously worked up at [facility] for 5 years ago at which time I had sent and will send him over for consideration of extensive skin graft I know they biopsied this many times but he never had plastic surgery. The wound area is much too large for standard size dressings we have been using silver alginate but we did give him a good trial last fall of Hydrofera Blue that did not make any difference either. If anything the area on the right medial lower leg over time has gotten a lot smaller as has the area on the left although it still quite substantial now. Readmission 10/08/2021 Mr. Jaan Fischel is a 66 year old male with a past medical history of insulin-dependent type 2 diabetes with last hemoglobin A1c of 10.2, chronic venous insufficiency, lymphedema and chronic diastolic heart failure that presents to the clinic for bilateral lower extremity wounds. He has been followed in our clinic previously for the past 6 years for these issues. He was last seen in April 2022. He is inconsistent with his appointments in our clinic. These wounds have not healed over the past few years. He is currently using silver alginate to the wound beds and keeping these covered with Kerlix. He currently denies systemic signs of infection. He was recently hospitalized for septic shock secondary to bacteremia from likely chronic leg wounds And is currently on oral antibiotics to be completed this week. 1/30; patient presents for follow-up. He has no issues or complaints today. 2/6; patient presents for follow-up. He reports more discomfort with the increase in compression wraps. He currently denies systemic signs of infection and feels well overall. 2/13; patient presents for follow-up. He reports tolerating the current leg/Coban wrap well. He denies systemic signs of infection and feels fine overall. 2/21; patient presents for follow-up. He has no issues or complaints today.  He tolerated the kirlex/Coban wrap. He denies systemic signs of infection. 3/21; patient presents for follow-up. He has no issues or complaints today. He saw infectious disease, Dr. Megan Salon. Nothing further to do from an infectious disease point. Levaquin was stopped. He currently denies systemic signs of infection. 4/4; patient presents for follow-up. His facility was able to obtain the [facility] antibiotic and has started using this with dressing changes/wrap changes. Patient has no issues or complaints today. He denies signs of infection. 4/18; patient presents for follow-up. He has no issues or complaints today. He denies signs of infection. 5/9; patient presents for follow-up. He has been using Keystone antibiotic under Kerlix/Coban. He denies signs of infection today. 6/6; monthly follow-up. Patient is at [facility] skilled facility. He has been using Keystone probably silver alginate under kerlix Coban. We use Sorbact here. Wound condition has improved 7/11; patient  presents for follow-up. We have been using Keystone with Sorbact under Kerlix/Coban. Patient has no issues or complaints today. 8/8; patient presents for follow-up. He is still using Keystone and Sorbact under Kerlix/Coban to his lower extremities bilaterally. He has no issues or complaints today. He denies signs of infection. 9/12; patient presents for follow-up. We have been using Keystone with Sorbact and calcium alginate under Kerlix/Coban to lower extremities bilaterally. He has no issues or complaints today. 10/10; patient presents for follow-up. We continue to use [facility] with Sorbact and calcium alginate under Kerlix/Coban. He has tolerated this well and he has no issues or complaints today. 11/14; patient presents for follow-up. We have been using Keystone with Sorbact and calcium alginate under Kerlix/Coban to the lower extremities bilaterally. He cannot tolerate higher compression. He states that the facility he  resides and changes his dressings 3 times a week. He has no issues or complaints today. Electronic Signature(s) Signed: 08/06/2022 2:06:49 PM By: [name] DO Frandsen, Jontue ([id number]) [id number] Page 6 of 16 Entered By: [name] on 08/06/2022 13:56:55 -------------------------------------------------------------------------------- Physical Exam Details [name], [name] 08/06/2022 12:45 PM Medical Record Number: [id number] Patient Account Number: [id number] Date of Birth/Sex: Treating RN: 1955/11/09 (66 y.o. M) Primary Care Provider: [name] Other Clinician: Referring Provider: Treating Provider/Extender: [name] in Treatment: 33 Constitutional respirations regular, non-labored and within target range for patient.. Cardiovascular 2+ dorsalis pedis/posterior tibialis pulses. Psychiatric pleasant and cooperative. Notes Bilateral lower extremity wounds with granulation tissue and nonviable tissue, left > right. No signs of surrounding infection. Evidence of lymphedema skin changes bilaterally to the lower extremities. Electronic Signature(s) Signed: 08/06/2022 2:06:49 PM By: [name] DO Entered By: [name] on 08/06/2022 13:57:34 -------------------------------------------------------------------------------- Physician Orders Details [name], [name] 08/06/2022 12:45 PM Medical Record Number: [id number] Patient Account Number: [id number] Date of Birth/Sex: Treating RN: 16-Jun-1956 (66 y.o. [name] Primary Care Provider: [name] Other Clinician: Referring Provider: Treating Provider/Extender: [name] in Treatment: 91 Verbal / Phone Orders: No Diagnosis Coding ICD-10 Coding Code Description [phone] Chronic venous hypertension (idiopathic) with ulcer and inflammation of bilateral lower  extremity 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ed L97.912 Non-pressure chronic ulcer of unspecified part of right lower leg with fat layer exposed I89.0 Lymphedema, not elsewhere classified E44.0 Moderate protein-calorie malnutrition E11.622 Type 2 diabetes mellitus with other skin ulcer V61.60 Chronic diastolic (congestive) heart failure Follow-up Appointments Return appointment in 1 month. - 4 weeks Dr. Heber  NEED STRETCHER ROOM ****EXTRA TIME 6 MINS. HOYEr/HIGH ACUITY*** No appt.'s after 11:00 and after 3:00!!**** ****PLEASE WRAP LEGS BEGINNING AT TOES AND GOING TO BEND OF KNEE*** Other: - Pt. to bring in Jay to every [facility] appt. please!!:) Favila, Kamareon ([id number]) [id number] Page 7 of 16 Anesthetic (In clinic) Topical Lidocaine 4% applied to wound bed Bathing/ Shower/ Hygiene May shower and wash wound with soap and water. - when changing dressing Edema Control - Lymphedema / SCD / Other Elevate legs to the level of the heart or above for 30 minutes daily and/or when sitting, a frequency of: - throughout the day Moisturize legs daily. - with dressing changes Additional Orders / Instructions Follow Nutritious Diet - Monitor/Control Blood Sugars-Continue using ProStat Other: - Go to emergency room for fever, chills, strong odor from wounds, increased pain in wounds Wound Treatment Wound #25 - Lower Leg Wound Laterality: Right, Medial  Cleanser: Soap and Water 3 x Per Week/30 Days Discharge Instructions: May shower and wash wound with dial antibacterial soap and water prior to dressing change. Cleanser: Wound Cleanser 3 x Per Week/30 Days Discharge Instructions: Cleanse the wound with wound cleanser prior to applying a clean dressing using gauze sponges, not tissue or cotton balls. Peri-Wound Care: Sween Lotion (Moisturizing lotion) 3 x Per Week/30 Days Discharge Instructions: Apply moisturizing lotion  as directed Prim Dressing: Maxorb Extra Calcium Alginate Dressing, 4x4 in 3 x Per Week/30 Days ary Discharge Instructions: Apply calcium alginate over the cutimed to help absorb drainage. Prim Dressing: Cutimed Sorbact Swab 3 x Per Week/30 Days ary Discharge Instructions: Apply to wound bed as instructed Prim Dressing: Keystone Antibiotic spray ary 3 x Per Week/30 Days Secondary Dressing: ABD Pad, 8x10 3 x Per Week/30 Days Discharge Instructions: Apply over primary dressing as directed. Compression Wrap: Kerlix Roll 4.5x3.1 (in/yd) 3 x Per Week/30 Days Discharge Instructions: Apply Kerlix and Coban compression as directed. Compression Wrap: Coban Self-Adherent Wrap 4x5 (in/yd) 3 x Per Week/30 Days Discharge Instructions: Apply over Kerlix as directed. Wound #27 - Lower Leg Wound Laterality: Left, Posterior Cleanser: Soap and Water 3 x Per Week/30 Days Discharge Instructions: May shower and wash wound with dial antibacterial soap and water prior to dressing change. Cleanser: Wound Cleanser 3 x Per Week/30 Days Discharge Instructions: Cleanse the wound with wound cleanser prior to applying a clean dressing using gauze sponges, not tissue or cotton balls. Peri-Wound Care: Sween Lotion (Moisturizing lotion) 3 x Per Week/30 Days Discharge Instructions: Apply moisturizing lotion as directed Prim Dressing: Maxorb Extra Calcium Alginate Dressing, 4x4 in 3 x Per Week/30 Days ary Discharge Instructions: Apply calcium alginate over the cutimed to help absorb drainage. Prim Dressing: Cutimed Sorbact Swab 3 x Per Week/30 Days ary Discharge Instructions: Apply to wound bed as instructed Prim Dressing: Keystone Antibiotic spray ary 3 x Per Week/30 Days Secondary Dressing: ABD Pad, 8x10 3 x Per Week/30 Days Discharge Instructions: Apply over primary dressing as directed. Compression Wrap: Kerlix Roll 4.5x3.1 (in/yd) 3 x Per Week/30 Days Discharge Instructions: Apply Kerlix and Coban compression as  directed. Compression Wrap: Coban Self-Adherent Wrap 4x5 (in/yd) 3 x Per Week/30 Days Discharge Instructions: Apply over Kerlix as directed. Wound #29 - Lower Leg Wound Laterality: Right, Lateral Cleanser: Soap and Water 3 x Per Week/30 Days Discharge Instructions: May shower and wash wound with dial antibacterial soap and water prior to dressing change. Cleanser: Wound Cleanser 3 x Per Week/30 Days Discharge Instructions: Cleanse the wound with wound cleanser prior to applying a clean dressing using gauze sponges, not tissue or cotton balls. Peri-Wound Care: Sween Lotion (Moisturizing lotion) 3 x Per Week/30 Days [name], [name] ([id number]) [id number] Page 8 of 16 Discharge Instructions: Apply moisturizing lotion as directed Prim Dressing: Maxorb Extra Calcium Alginate Dressing, 4x4 in 3 x Per Week/30 Days ary Discharge Instructions: Apply calcium alginate over the cutimed to help absorb drainage. Prim Dressing: Cutimed Sorbact Swab 3 x Per Week/30 Days ary Discharge Instructions: Apply to wound bed as instructed Prim Dressing: Keystone Antibiotic spray ary 3 x Per Week/30 Days Secondary Dressing: ABD Pad, 8x10 3 x Per Week/30 Days Discharge Instructions: Apply over primary dressing as directed. Compression Wrap: Kerlix Roll 4.5x3.1 (in/yd) 3 x Per Week/30 Days Discharge Instructions: Apply Kerlix and Coban compression as directed. Compression Wrap: Coban Self-Adherent Wrap 4x5 (in/yd) 3 x Per Week/30 Days Discharge Instructions: Apply over Kerlix as directed. Electronic Signature(s) Signed: 08/06/2022 2:06:49 PM By: Heber Millston,  Janett Billow DO Entered By: [name] on 08/06/2022 13:58:06 -------------------------------------------------------------------------------- Problem List Details [name], [name] 08/06/2022 12:45 PM Medical Record Number: [id number] Patient Account Number: [id number] Date of Birth/Sex: Treating  RN: 14-Mar-1956 (66 y.o. [name] Primary Care Provider: [name] Other Clinician: Referring Provider: Treating Provider/Extender: [name] in Treatment: 51 Active Problems ICD-10 Encounter Code Description Active Date MDM Diagnosis I87.333 Chronic venous hypertension (idiopathic) with ulcer and inflammation of 10/08/2021 No Yes bilateral lower extremity 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ed1/16/2023 No Yes L97.912 Non-pressure chronic ulcer of unspecified part of right lower leg with fat layer 10/08/2021 No Yes exposed I89.0 Lymphedema, not elsewhere classified 10/08/2021 No Yes E44.0 Moderate protein-calorie malnutrition 06/04/2022 No Yes E11.622 Type 2 diabetes mellitus with other skin ulcer 06/04/2022 No Yes L38.10 Chronic diastolic (congestive) heart failure 06/04/2022 No Yes Leonetti, Alphonsa ([id number]) [id number] Page 9 of 16 Inactive Problems Resolved Problems Electronic Signature(s) Signed: 08/06/2022 2:06:49 PM By: [name] DO Entered By: [name] on 08/06/2022 13:55:49 -------------------------------------------------------------------------------- Progress Note Details [name], [name] 08/06/2022 12:45 PM Medical Record Number: [id number] Patient Account Number: [id number] Date of Birth/Sex: Treating RN: 1956/03/09 (66 y.o. M) Primary Care Provider: [name] Other Clinician: Referring Provider: Treating Provider/Extender: [name] in Treatment: 57 Subjective Chief Complaint Information obtained from Patient 10/08/2021; bilateral lower extremity wounds History of Present Illness (HPI) 07/07/15; this is a patient who is in hospital on 8/2 through 8/4. He had cellulitis and abscess of predominantly I think the left leg. He received IV antibiotics. Plain x-ray showed no  osteomyelitis. An MRI of the left leg did not show osteomyelitis. Cultures showed no predominant organism. His hemoglobin A1c was 9.8. He has a history of venous stasis also peripheral vascular disease. He was discharged to [place] home. He really has extensive ulcerations on the left lateral leg including a major wound that communicates both posteriorly and superiorly w. When drainage coming out of 2 smaller areas. He has a smaller wound on the posterior medial left leg. He has more predominantly venous insufficiency wounds on predominantly the right medial leg above the ankle the right foot into sections. He has recently been put on Augmentin at the nursing home. Previous ABIs/arterial evaluation showed triphasic waves diffusely he does not have a major ischemic issue a left lower extremity venous duplex also exam showed no evidence of a DVT on 6/21 07/21/15; the patient arrives with really no major change. Culture I did last time was negative. He has not had a follow-up MRI I ordered. The wounds are macerated covered with a thick gelatinous surface slough. There is necrotic subcutaneous tissue 08/04/15; the patient arrives with a considerable improvement in the majority of his wound area. The area on the left leg now has what looks to be a granulated base. Most of the wounds on the left leg required an aggressive surgical debridement to remove nonviable fibrinous eschar and subcutaneous tissue however after debridement most of this looks better. Although I had asked for repeat MRI of the left leg when he first came in here with grossly purulent material coming out of his wounds, it does not appear that this is been done and nor do I actually feel that strongly about it right now. He has severe surrounding venous insufficiency and inflammation. I don't believe he has significant PAD 08/25/15. In  general there is still a considerable wound area here but with still extensive service adherent  slough. The patient will not allow mechanical debridement due to pain. He did not tolerate Medihoney therefore we are left with Santyl for now. She would appear that he has severe surrounding venous stasis. There is no evidence of the infection that may have had something to do with the pathogenesis of these wounds 09/29/15; patient still has substantial wound area on the left leg with a cluster of several wounds on the lateral leg confluently on the left leg posteriorly and then a substantial wound on the medial leg. On the right leg a substantial wound medially and a small area on the right lateral foot. All of these underwent a substantial surgical debridement with curettes which she tolerated better than he has in the past. This started as a complex cellulitis in the face of chronic venous insufficiency and inflammation. 10/13/15; substantial cluster of wounds on the lateral aspect of his left leg confluently around to the other side. Extensive surgical debridement to remove for redness surface slough nonviable subcutaneous tissue. This is not an improvement. Also substantial wound on the medial right leg which is largely unchanged. The etiology of this was felt to be a complex cellulitis in the summer of 2016 in the face of chronic venous insufficiency and inflammation. The patient states that the wound on the right medial leg has been there for years off and on. 10/20/15; we are able to start [facility] to these extensive wound areas left greater than right on Tuesday after I spoke to the wound care nurse at the facility. He arrives here today for extensive surgical debridement for the wounds on the lateral aspect of his left leg posterior left leg, this is almost circumferential. He has a large wound on the medial aspect of his right leg although he finds this too painful for debridement 10/27/15; I continue to bring this patient back for frequent debridement/weekly debridement severe. We have  been using Hydrofera Blue. Unfortunately this has not really had any improvement. The debridement surgery difficult and painful for the patient 11/23/15; this patient spent a complex hospitalization admitted with acute kidney injury, anemia, cellulitis of the lower extremity, he was felt to have sepsis pathophysiology although his blood cultures were negative. He had plain x-rays of both legs that were negative for osseous abnormalities. He was seen by infectious disease and placed on a workup for vasculitis that was negative including a biopsy o4 all were consistent with stasis dermatitis. Vital broad-spectrum antibiotics he continued to spike fevers. Urine and chest x-ray were negative Dopplers on admission ruled out a DVT All antibiotics were stopped on 2/17. . Since his return to [facility] skilled nursing facility I believe they have been using Xeroform 12/07/15 the patient arrives today with the area on his right medial leg actually looking quite stable. No debridement. The rest of his extensive wounds on the left lateral left posterior extending into the left medial leg all required extensive debridement. I think this is probably going to need to and up in the hands of plastic surgery. We'll attempt to change him back to [facility]. Arrange consultation with plastic surgery at [facility] for this almost circumferential wound on the left side 12/21/15 right leg covered in surface slough. This was debridement. Left leg extensive wounds all carefully examined. This is almost circumferential on the left side especially on the posterior calf. No debridement is necessary. 01/04/16 the patient has been  to [facility] plastic surgery. Unfortunately it doesn't look like they had any of the prior workup on this patient. They're in the middle of vascular workup. It sounds as though they're applying Hydrofera Blue at the nursing home. 01/22/16; the patient has been back to see [facility]. There apparently  making plans to possibly do skin grafts over his large venous insufficiency ulcerations. The patient tells me that he goes to hematology in [facility] and variably uses the term platelets red cells and the description of his problem. 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 I do not see any major difference in the wound on the right medial leg and circumferentially across the left medial to left lateral leg. Did not attempt to debride these today. Readmission: Frederic, Dantre ([id number]) [id number] Page 10 of 16 05/05/18 on evaluation today patient presents for reevaluation here in our clinic although I have previously seen him in the skilled nursing facility over the past year and a half when I was working in the skilled nursing facility realm instead of covering in the clinic. With that being said during that time we had initiated most recently dressings with Hydrofera Blue Dressing along with the [employer] which had done very well for him. Much better than the Kerlex Coban wraps. With that being said the patient had been doing fairly well and was making progress when I last saw him. Upon evaluation today it appears that the wounds are actually a little bit worse than when I last saw him although definitely not dramatically so. There does not appear to be any evidence of infection which is good news. With that being said he has been tolerating the wraps without complication his left hip still gives him a lot of trouble which is his main issue as far as movement is concerned. Unbeknownst to me he has not had venous studies that I can find. He previously told me he had there were arterial studies noted back from 04/27/15 which showed that he had try phasic blood flow throughout and again his test appear to be completely normal as performed by Dr. Creig Hines. With that being said I cannot find where he had venous studies. The patient  still states he thinks he did these definitely had no venous intervention at this point. Upon evaluation today it does not appear to me that the patient has any evidence of cellulitis. He does have some chronic venous insufficiency which I think has led to stasis dermatitis but again this is not appearing to be infected at this point. No fevers, chills, nausea, or vomiting noted at this time. READMISSION 06/30/2018 This is a now 66 year old man we have had in this clinic at multiple times in the past. Most recently he came in August and was seen by [facility]. It is not clear why he did not come back we asked him really did not get a straight answer. He is at [facility] skilled facility he is a man who has severe chronic venous insufficiency with lymphedema and has had severe wounds on his left greater than right leg. We had him here in 2016 and 17 ultimately referred him to [facility]. He had arterial studies and venous studies done at [facility] although I am not able to access these records I see they are actually done. He also had multiple biopsies that were negative for malignancy showing changes compatible with chronic inflammation. As I understand things in [place] they are using Iodoflex and Unna boots. I am not really sure how they are getting enough Iodoflex for the large wound area especially on his left calf. Nevertheless the wounds look better than when I saw this in 2017. There were plans for him to have plastic surgery in 2018 and I think he was actually prepped for surgery however he was ultimately denied because the patient was an active smoker. The patient is not systemically unwell. The wounds are painful however given the size especially on the left this is not surprising. ABIs in this clinic were 0.78 on the left and 0.91 on the right 07/13/2018; this is a patient with bilateral severe venous ulcers with secondary lymphedema. The wounds are on the right medial calf and a  substantial part of the left posterior calf and some involvement medially and laterally on the left. We changed him to silver alginate under Unna boot's last time. The wounds actually look fairly satisfactory today. 08/14/2018; this is a patient with severe bilateral venous stasis ulcers with secondary lymphedema left greater than right he is at [facility] using silver alginate under Unna boot's I do not think there is much change on this visit versus last time I saw him a month ago Readmission: 11/04/18 patient presents today for follow-up concerning his bilateral lower extremity ulcers. His a right medial ankle ulcer and a left leg that has ulcers over a large proportion of the surface area between the ankle and knee. Unfortunately this does cause some discomfort for the patient although it doesn't seem to be as uncomfortable as it has been in the past. He is seen today for evaluation after a referral by [place] back to the clinic. Unfortunately has a lot of Slough noted on the surface of his wounds. He's been treated currently it sounds like with Dakin's soaked gauze and they have not been performing the Unna Boot wrap that was previously recommended. I'm not exactly sure what the reason for the change was. He does have less slough on the surface of the wound but again this is something that is often a constant issue for him. Again he's had these wounds for many years. I've known him for two years at least during that time when I was taking care of them in the facility he is Artie had the wounds for several years prior. READMISSION 02/25/2020 Josepha Pigg is a now 66 year old man. He has been in our clinic several times before most extensively in October 2016 through May 2017. At that time he had absolutely substantial bilateral lower extremity wounds secondary to chronic venous insufficiency and lymphedema. We ultimately referred him to [facility] he had a series of biopsies only showed suggestion of  wound secondary to chronic venous insufficiency. He had a less extensive stay in our clinic in 2019 and then a single visit in February 2020. He is a resident at [facility] skilled facility. I think he has had intermittently had in facility wound care. He returns today. They are using silver alginate on the wounds although I am not sure what type of compression. Previously he has favored Unna boots. He is not felt to have an arterial issue. Past medical history; lymphedema and chronic venous insufficiency, diabetes mellitus, hypertension, congestive heart failure We did not do arterial studies today 04/27/2020 on evaluation patient appears to be doing really about the same as when I have known him and seen him in the past. He does have wounds on the right and  left legs at this point. Fortunately there is no signs of active infection at this time. 9/2; 1 month follow-up. This is a man with severe chronic venous insufficiency and lymphedema. When I first met him he had horrible circumferential bilateral lower extremity ulcers. We have been using silver alginate up until early last month when it was changed to [facility] and Unna boot's more or less says maintenance dressings. Since the last time he was here the facility where he resides [facility] called to report they could no longer do Unna boot so he is apparently only been receiving kerlix Coban the left leg is certainly a lot worse with almost circumferential large wounds especially lateral but now medial and posterior as well. He has a standard same looking wound on the right medial lower leg. 10/1; absolutely no improvement here. In fact I do not think anything much is changed. The substantial area on his left lateral and left posterior calf is still open may be slightly larger. He has a more modest wound on the right medial calf. None of these look any different. He comes in with a kerlix Coban wrap this will simply not be adequate. He has  too much edema in this leg He also is complaining a lot of pain in his left heel area. 11/8; potential wounds on the left lateral and posterior calf and a smaller oval wound in the right medial. We have been using Hydrofera Blue under 4-layer compression. He was using Unna boots still 2 months ago the facility called to say they can no longer place these so we have been using 4-layer compression. The surface area of the area on the left is so large there is very few alternatives to what we can use on this either silver alginate or Hydrofera Blue. He was complaining of pain on the left heel last time I asked for an x-ray this was apparently done although we do not have the result. He is not complaining of pain today. 08/16/2020 on evaluation today patient is is seen for a early visit due to the fact that he apparently is having issues I was told when they called on Monday with a MRSA infection that they felt like we needed to see him for because his legs "looked horrible". With that being said based on what I see at this point it does not appear that the patient is actually having a terrible infection in fact compared to last time I saw him his legs do not appear to be doing too poorly at all at this time. Nonetheless he has been on doxycycline that is not going to be a good option for him based on the culture report that I have for review today which graded as a partial report with still several organisms pending as far as identification is concerned we did contact them but again they do not have the final report yet. Either way based on what we see it appears that doxycycline is one of the few medications that will not work for the Citrobacter. Obviously I think that a different medication would be a good option for him since there is a gram-negative organism pending I am likely can I suggest Cipro as the best of backup antibiotic to switch him to at this point. All this was discussed with the  patient today. 12/20; not much change in the substantial wound on the left posterior calf and the small area on the right medial. He has a new  area on the left anterior which looks like a wrap injury. Had some thoughts about doing a deep tissue culture here although we will put that off for next time. Using silver alginate as a primary dressing 2/10; substantial area on the left posterior calf. This measures smaller but still is a very large area. He has the area on the right anterior medial which also is a fairly large wound but much smaller than not on the left. His edema control is quite good. We have been using silver alginate because of the size of the Legner, Tamim ([id number]) [id number] Page 11 of 16 wounds. 4/7; substantial wound on the left posterior calf and a smaller area on the right medial lower leg. These are very chronic wounds which we have classified is venous. Previously worked up at [facility] for 5 years ago at which time I had sent and will send him over for consideration of extensive skin graft I know they biopsied this many times but he never had plastic surgery. The wound area is much too large for standard size dressings we have been using silver alginate but we did give him a good trial last fall of Hydrofera Blue that did not make any difference either. If anything the area on the right medial lower leg over time has gotten a lot smaller as has the area on the left although it still quite substantial now. Readmission 10/08/2021 Mr. Alick Lecomte is a 66 year old male with a past medical history of insulin-dependent type 2 diabetes with last hemoglobin A1c of 10.2, chronic venous insufficiency, lymphedema and chronic diastolic heart failure that presents to the clinic for bilateral lower extremity wounds. He has been followed in our clinic previously for the past 6 years for these issues. He was last seen in April 2022. He is inconsistent with his  appointments in our clinic. These wounds have not healed over the past few years. He is currently using silver alginate to the wound beds and keeping these covered with Kerlix. He currently denies systemic signs of infection. He was recently hospitalized for septic shock secondary to bacteremia from likely chronic leg wounds And is currently on oral antibiotics to be completed this week. 1/30; patient presents for follow-up. He has no issues or complaints today. 2/6; patient presents for follow-up. He reports more discomfort with the increase in compression wraps. He currently denies systemic signs of infection and feels well overall. 2/13; patient presents for follow-up. He reports tolerating the current leg/Coban wrap well. He denies systemic signs of infection and feels fine overall. 2/21; patient presents for follow-up. He has no issues or complaints today. He tolerated the kirlex/Coban wrap. He denies systemic signs of infection. 3/21; patient presents for follow-up. He has no issues or complaints today. He saw infectious disease, Dr. Megan Salon. Nothing further to do from an infectious disease point. Levaquin was stopped. He currently denies systemic signs of infection. 4/4; patient presents for follow-up. His facility was able to obtain the [facility] antibiotic and has started using this with dressing changes/wrap changes. Patient has no issues or complaints today. He denies signs of infection. 4/18; patient presents for follow-up. He has no issues or complaints today. He denies signs of infection. 5/9; patient presents for follow-up. He has been using Keystone antibiotic under Kerlix/Coban. He denies signs of infection today. 6/6; monthly follow-up. Patient is at [facility] skilled facility. He has been using Keystone probably silver alginate under kerlix Coban. We use Sorbact here. Wound condition has improved 7/11; patient  presents for follow-up. We have been using Keystone with Sorbact  under Kerlix/Coban. Patient has no issues or complaints today. 8/8; patient presents for follow-up. He is still using Keystone and Sorbact under Kerlix/Coban to his lower extremities bilaterally. He has no issues or complaints today. He denies signs of infection. 9/12; patient presents for follow-up. We have been using Keystone with Sorbact and calcium alginate under Kerlix/Coban to lower extremities bilaterally. He has no issues or complaints today. 10/10; patient presents for follow-up. We continue to use [facility] with Sorbact and calcium alginate under Kerlix/Coban. He has tolerated this well and he has no issues or complaints today. 11/14; patient presents for follow-up. We have been using Keystone with Sorbact and calcium alginate under Kerlix/Coban to the lower extremities bilaterally. He cannot tolerate higher compression. He states that the facility he resides and changes his dressings 3 times a week. He has no issues or complaints today. Patient History Information obtained from Patient. Family History Diabetes - Mother,Father,Siblings, Hypertension - Mother,Father, No family history of Cancer, Heart Disease, Hereditary Spherocytosis, Kidney Disease, Lung Disease, Seizures, Stroke, Thyroid Problems, Tuberculosis. Social History Current every day smoker - 4 cig per day, Marital Status - Separated, Alcohol Use - Never, Drug Use - Prior History - cocaine, [place] quit 2005, Caffeine Use - Moderate. Medical History Eyes Denies history of Cataracts, Glaucoma, Optic Neuritis Ear/Nose/Mouth/Throat Denies history of Chronic sinus problems/congestion, Middle ear problems Hematologic/Lymphatic Patient has history of Anemia - iron deficiency Denies history of Hemophilia, Human Immunodeficiency Virus, Lymphedema, Sickle Cell Disease Respiratory Patient has history of Sleep Apnea Denies history of Aspiration, Asthma, Chronic Obstructive Pulmonary Disease (COPD), Pneumothorax,  Tuberculosis Cardiovascular Patient has history of Congestive Heart Failure, Coronary Artery Disease, Hypertension, Peripheral Venous Disease Denies history of Angina, Arrhythmia, Deep Vein Thrombosis, Hypotension, Myocardial Infarction, Peripheral Arterial Disease, Phlebitis, Vasculitis Gastrointestinal Denies history of Cirrhosis , Colitis, Crohnoos, Hepatitis A, Hepatitis B, Hepatitis C Endocrine Patient has history of Type II Diabetes - uncontrolled Denies history of Type I Diabetes Genitourinary Denies history of End Stage Renal Disease Immunological Denies history of Lupus Erythematosus, Raynaudoos, Scleroderma Integumentary (Skin) Denies history of History of Burn Musculoskeletal Patient has history of Osteoarthritis Denies history of Gout, Rheumatoid Arthritis, Osteomyelitis Neurologic Micek, Tytus ([id number]) [id number] Page 12 of 16 Patient has history of Neuropathy Denies history of Dementia, Quadriplegia, Paraplegia, Seizure Disorder Oncologic Denies history of Received Chemotherapy, Received Radiation Psychiatric Denies history of Anorexia/bulimia, Confinement Anxiety Hospitalization/Surgery History - inguinal hernia repair with mesh. - right shoulder rotator cuff repair. - toe nail excision. - Sepsis 09/03/21-09/10/22. Medical A Surgical History Notes nd Gastrointestinal GERD Musculoskeletal degenerative righ thip Psychiatric depression Objective Constitutional respirations regular, non-labored and within target range for patient.. Cardiovascular 2+ dorsalis pedis/posterior tibialis pulses. Psychiatric pleasant and cooperative. General Notes: Bilateral lower extremity wounds with granulation tissue and nonviable tissue, left > right. No signs of surrounding infection. Evidence of lymphedema skin changes bilaterally to the lower extremities. Integumentary (Hair, Skin) Wound #25 status is Open. Original cause of wound was  Gradually Appeared. The date acquired was: 09/07/2021. The wound has been in treatment 43 weeks. The wound is located on the Right,Medial Lower Leg. The wound measures 5.5cm length x 2.7cm width x 0.2cm depth; 11.663cm^2 area and 2.333cm^3 volume. There is Fat Layer (Subcutaneous Tissue) exposed. There is no tunneling or undermining noted. There is a medium amount of serosanguineous drainage noted. The wound margin is thickened. There is large (67-100%) red, pink granulation within the wound bed. There is no  necrotic tissue within the wound bed. The periwound skin appearance did not exhibit: Callus, Crepitus, Excoriation, Induration, Rash, Scarring, Dry/Scaly, Maceration, Atrophie Blanche, Cyanosis, Ecchymosis, Hemosiderin Staining, Mottled, Pallor, Rubor, Erythema. Wound #27 status is Open. Original cause of wound was Gradually Appeared. The date acquired was: 09/07/2021. The wound has been in treatment 43 weeks. The wound is located on the Left,Posterior Lower Leg. The wound measures 19cm length x 17cm width x 0.4cm depth; 253.684cm^2 area and 101.473cm^3 volume. There is Fat Layer (Subcutaneous Tissue) exposed. There is no tunneling or undermining noted. There is a medium amount of serosanguineous drainage noted. The wound margin is distinct with the outline attached to the wound base. There is medium (34-66%) red, friable granulation within the wound bed. There is a medium (34-66%) amount of necrotic tissue within the wound bed including Eschar and Adherent Slough. The periwound skin appearance did not exhibit: Callus, Crepitus, Excoriation, Induration, Rash, Scarring, Dry/Scaly, Maceration, Atrophie Blanche, Cyanosis, Ecchymosis, Hemosiderin Staining, Mottled, Pallor, Rubor, Erythema. Periwound temperature was noted as No Abnormality. The periwound has tenderness on palpation. Wound #29 status is Open. Original cause of wound was Gradually Appeared. The date acquired was: 04/30/2022. The wound has  been in treatment 14 weeks. The wound is located on the Right,Lateral Lower Leg. The wound measures 1.2cm length x 1cm width x 0.3cm depth; 0.942cm^2 area and 0.283cm^3 volume. There is Fat Layer (Subcutaneous Tissue) exposed. There is no tunneling or undermining noted. There is a medium amount of serosanguineous drainage noted. The wound margin is distinct with the outline attached to the wound base. There is medium (34-66%) red, pink granulation within the wound bed. There is a medium (34-66%) amount of necrotic tissue within the wound bed including Adherent Slough. The periwound skin appearance did not exhibit: Callus, Crepitus, Excoriation, Induration, Rash, Scarring, Dry/Scaly, Maceration, Atrophie Blanche, Cyanosis, Ecchymosis, Hemosiderin Staining, Mottled, Pallor, Rubor, Erythema. Assessment Active Problems ICD-10 Chronic venous hypertension (idiopathic) with ulcer and inflammation of bilateral lower extremity Non-pressure chronic ulcer of other part of left lower leg with fat layer exposed Non-pressure chronic ulcer of unspecified part of right lower leg with fat layer exposed Lymphedema, not elsewhere classified Moderate protein-calorie malnutrition Type 2 diabetes mellitus with other skin ulcer Chronic diastolic (congestive) heart failure Patient's wounds are stable. I debrided nonviable tissue. These are very slow to heal secondary to type 2 diabetes, protein calorie malnutrition and lymphedema. He does not tolerate higher compression. We may need to rediscuss this at next clinic visit since his swelling is not well controlled. He may benefit from a 3 layer compression. Follow-up in 1 month. [name], [name] ([id number]) [id number] Page 13 of 16 Procedures Wound #25 Pre-procedure diagnosis of Wound #25 is a Venous Leg Ulcer located on the Right,Medial Lower Leg .Severity of Tissue Pre Debridement is: Fat layer exposed. There was a Excisional  Skin/Subcutaneous Tissue Debridement with a total area of 14.85 sq cm performed by [name], DO. With the following instrument(s): Curette to remove Viable and Non-Viable tissue/material. Material removed includes Subcutaneous Tissue and Slough and after achieving pain control using Lidocaine 4% Topical Solution. A time out was conducted at 13:15, prior to the start of the procedure. A Minimum amount of bleeding was controlled with Pressure. The procedure was tolerated well with a pain level of 0 throughout and a pain level of 0 following the procedure. Post Debridement Measurements: 5.5cm length x 2.7cm width x 0.2cm depth; 2.333cm^3 volume. Character of Wound/Ulcer Post Debridement is improved. Severity of Tissue Post Debridement  is: Fat layer exposed. Post procedure Diagnosis Wound #25: Same as Pre-Procedure Wound #27 Pre-procedure diagnosis of Wound #27 is a Venous Leg Ulcer located on the Left,Posterior Lower Leg .Severity of Tissue Pre Debridement is: Fat layer exposed. There was a Excisional Skin/Subcutaneous Tissue Debridement with a total area of 323 sq cm performed by [name], DO. With the following instrument(s): Curette to remove Viable and Non-Viable tissue/material. Material removed includes Subcutaneous Tissue and Slough and after achieving pain control using Lidocaine 4% Topical Solution. A time out was conducted at 13:15, prior to the start of the procedure. A Minimum amount of bleeding was controlled with Pressure. The procedure was tolerated well with a pain level of 0 throughout and a pain level of 0 following the procedure. Post Debridement Measurements: 19cm length x 17cm width x 0.4cm depth; 101.473cm^3 volume. Character of Wound/Ulcer Post Debridement is improved. Severity of Tissue Post Debridement is: Fat layer exposed. Post procedure Diagnosis Wound #27: Same as Pre-Procedure Wound #29 Pre-procedure diagnosis of Wound #29 is a Venous Leg Ulcer located on  the Right,Lateral Lower Leg .Severity of Tissue Pre Debridement is: Fat layer exposed. There was a Excisional Skin/Subcutaneous Tissue Debridement with a total area of 1.2 sq cm performed by [name], DO. With the following instrument(s): Curette to remove Viable and Non-Viable tissue/material. Material removed includes Subcutaneous Tissue and Slough and after achieving pain control using Lidocaine 4% Topical Solution. A time out was conducted at 13:15, prior to the start of the procedure. A Minimum amount of bleeding was controlled with Pressure. The procedure was tolerated well with a pain level of 0 throughout and a pain level of 0 following the procedure. Post Debridement Measurements: 1.2cm length x 1cm width x 0.3cm depth; 0.283cm^3 volume. Character of Wound/Ulcer Post Debridement is improved. Severity of Tissue Post Debridement is: Fat layer exposed. Post procedure Diagnosis Wound #29: Same as Pre-Procedure Plan Follow-up Appointments: Return appointment in 1 month. - 4 weeks Dr. Heber  NEED STRETCHER ROOM ****EXTRA TIME 35 MINS. HOYEr/HIGH ACUITY*** No appt.'s after 11:00 and after 3:00!!**** ****PLEASE WRAP LEGS BEGINNING AT TOES AND GOING TO BEND OF KNEE*** Other: - Pt. to bring in [place] to every [facility] appt. please!!:) Anesthetic: (In clinic) Topical Lidocaine 4% applied to wound bed Bathing/ Shower/ Hygiene: May shower and wash wound with soap and water. - when changing dressing Edema Control - Lymphedema / SCD / Other: Elevate legs to the level of the heart or above for 30 minutes daily and/or when sitting, a frequency of: - throughout the day Moisturize legs daily. - with dressing changes Additional Orders / Instructions: Follow Nutritious Diet - Monitor/Control Blood Sugars-Continue using ProStat Other: - Go to emergency room for fever, chills, strong odor from wounds, increased pain in wounds WOUND #25: - Lower Leg Wound Laterality: Right, Medial Cleanser: Soap and  Water 3 x Per Week/30 Days Discharge Instructions: May shower and wash wound with dial antibacterial soap and water prior to dressing change. Cleanser: Wound Cleanser 3 x Per Week/30 Days Discharge Instructions: Cleanse the wound with wound cleanser prior to applying a clean dressing using gauze sponges, not tissue or cotton balls. Peri-Wound Care: Sween Lotion (Moisturizing lotion) 3 x Per Week/30 Days Discharge Instructions: Apply moisturizing lotion as directed Prim Dressing: Maxorb Extra Calcium Alginate Dressing, 4x4 in 3 x Per Week/30 Days ary Discharge Instructions: Apply calcium alginate over the cutimed to help absorb drainage. Prim Dressing: Cutimed Sorbact Swab 3 x Per Week/30 Days ary Discharge Instructions: Apply to wound  bed as instructed Prim Dressing: Keystone Antibiotic spray 3 x Per Week/30 Days ary Secondary Dressing: ABD Pad, 8x10 3 x Per Week/30 Days Discharge Instructions: Apply over primary dressing as directed. Com pression Wrap: Kerlix Roll 4.5x3.1 (in/yd) 3 x Per Week/30 Days Discharge Instructions: Apply Kerlix and Coban compression as directed. Com pression Wrap: Coban Self-Adherent Wrap 4x5 (in/yd) 3 x Per Week/30 Days Discharge Instructions: Apply over Kerlix as directed. WOUND #27: - Lower Leg Wound Laterality: Left, Posterior Cleanser: Soap and Water 3 x Per Week/30 Days Discharge Instructions: May shower and wash wound with dial antibacterial soap and water prior to dressing change. Cleanser: Wound Cleanser 3 x Per Week/30 Days Discharge Instructions: Cleanse the wound with wound cleanser prior to applying a clean dressing using gauze sponges, not tissue or cotton balls. Peri-Wound Care: Sween Lotion (Moisturizing lotion) 3 x Per Week/30 Days Discharge Instructions: Apply moisturizing lotion as directed Prim Dressing: Maxorb Extra Calcium Alginate Dressing, 4x4 in 3 x Per Week/30 Days ary Discharge Instructions: Apply calcium alginate over the cutimed to  help absorb drainage. Prim Dressing: Cutimed Sorbact Swab 3 x Per Week/30 Days ary Discharge Instructions: Apply to wound bed as instructed Prim Dressing: Keystone Antibiotic spray 3 x Per Week/30 Days ary Secondary Dressing: ABD Pad, 8x10 3 x Per Week/30 Days Discharge Instructions: Apply over primary dressing as directed. Com pression Wrap: Kerlix Roll 4.5x3.1 (in/yd) 3 x Per Week/30 Days Incorvaia, Tabitha ([id number]) [id number] Page 14 of 16 Discharge Instructions: Apply Kerlix and Coban compression as directed. Com pression Wrap: Coban Self-Adherent Wrap 4x5 (in/yd) 3 x Per Week/30 Days Discharge Instructions: Apply over Kerlix as directed. WOUND #29: - Lower Leg Wound Laterality: Right, Lateral Cleanser: Soap and Water 3 x Per Week/30 Days Discharge Instructions: May shower and wash wound with dial antibacterial soap and water prior to dressing change. Cleanser: Wound Cleanser 3 x Per Week/30 Days Discharge Instructions: Cleanse the wound with wound cleanser prior to applying a clean dressing using gauze sponges, not tissue or cotton balls. Peri-Wound Care: Sween Lotion (Moisturizing lotion) 3 x Per Week/30 Days Discharge Instructions: Apply moisturizing lotion as directed Prim Dressing: Maxorb Extra Calcium Alginate Dressing, 4x4 in 3 x Per Week/30 Days ary Discharge Instructions: Apply calcium alginate over the cutimed to help absorb drainage. Prim Dressing: Cutimed Sorbact Swab 3 x Per Week/30 Days ary Discharge Instructions: Apply to wound bed as instructed Prim Dressing: Keystone Antibiotic spray 3 x Per Week/30 Days ary Secondary Dressing: ABD Pad, 8x10 3 x Per Week/30 Days Discharge Instructions: Apply over primary dressing as directed. Com pression Wrap: Kerlix Roll 4.5x3.1 (in/yd) 3 x Per Week/30 Days Discharge Instructions: Apply Kerlix and Coban compression as directed. Com pression Wrap: Coban Self-Adherent Wrap 4x5 (in/yd) 3 x Per Week/30  Days Discharge Instructions: Apply over Kerlix as directed. 1. In office sharp debridement 2. Sorbact and Keystone antibiotic ointment with calcium alginate backing under Kerlix/Coban to the lower extremities bilaterally 3. Follow-up in 1 month Electronic Signature(s) Signed: 08/06/2022 2:06:49 PM By: [name] DO Entered By: [name] on 08/06/2022 14:01:38 -------------------------------------------------------------------------------- HxROS Details [name], [name] 08/06/2022 12:45 PM Medical Record Number: [id number] Patient Account Number: [id number] Date of Birth/Sex: Treating RN: 1956-07-21 (66 y.o. M) Primary Care Provider: [name] Other Clinician: Referring Provider: Treating Provider/Extender: [name] in Treatment: 64 Information Obtained From Patient Eyes Medical History: Negative for: Cataracts; Glaucoma; Optic Neuritis Ear/Nose/Mouth/Throat Medical History: Negative for: Chronic sinus problems/congestion; Middle ear problems  Hematologic/Lymphatic Medical History: Positive for: Anemia - iron deficiency Negative for: Hemophilia; Human Immunodeficiency Virus; Lymphedema; Sickle Cell Disease Respiratory Medical History: Positive for: Sleep Apnea Negative for: Aspiration; Asthma; Chronic Obstructive Pulmonary Disease (COPD); Pneumothorax; Tuberculosis Cardiovascular Medical History: Positive for: Congestive Heart Failure; Coronary Artery Disease; Hypertension; Peripheral Venous Disease Negative for: Angina; Arrhythmia; Deep Vein Thrombosis; Hypotension; Myocardial Infarction; Peripheral Arterial Disease; Phlebitis; Vasculitis [name], [name] ([id number]) [id number] Page 15 of 16 Gastrointestinal Medical History: Negative for: Cirrhosis ; Colitis; Crohns; Hepatitis A; Hepatitis B; Hepatitis C Past Medical History Notes: GERD Endocrine Medical History: Positive  for: Type II Diabetes - uncontrolled Negative for: Type I Diabetes Time with diabetes: 1999 Treated with: Insulin Blood sugar tested every day: Yes T ested : 3x per day Blood sugar testing results: Breakfast: 81-200; Lunch: 180-210; Dinner: 200 Genitourinary Medical History: Negative for: End Stage Renal Disease Immunological Medical History: Negative for: Lupus Erythematosus; Raynauds; Scleroderma Integumentary (Skin) Medical History: Negative for: History of Burn Musculoskeletal Medical History: Positive for: Osteoarthritis Negative for: Gout; Rheumatoid Arthritis; Osteomyelitis Past Medical History Notes: degenerative righ thip Neurologic Medical History: Positive for: Neuropathy Negative for: Dementia; Quadriplegia; Paraplegia; Seizure Disorder Oncologic Medical History: Negative for: Received Chemotherapy; Received Radiation Psychiatric Medical History: Negative for: Anorexia/bulimia; Confinement Anxiety Past Medical History Notes: depression Immunizations Pneumococcal Vaccine: Received Pneumococcal Vaccination: Yes Received Pneumococcal Vaccination On or After 60th Birthday: No Implantable Devices None Hospitalization / Surgery History Type of Hospitalization/Surgery inguinal hernia repair with mesh right shoulder rotator cuff repair toe nail excision Sepsis 09/03/21-09/10/22 Family and Social History Cancer: No; Diabetes: Yes - Mother,Father,Siblings; Heart Disease: No; Hereditary Spherocytosis: No; Hypertension: Yes - Mother,Father; Kidney Disease: No; Lung Disease: No; Seizures: No; Stroke: No; Thyroid Problems: No; Tuberculosis: No; Current every day smoker - 4 cig per day; Marital Status - Separated; Alcohol Use: Never; Drug Use: Prior History - cocaine, Lewis quit 2005; Caffeine Use: Moderate; Financial Concerns: No; Food, Clothing or Lovilia [name], [name] ([id number]) [phone].pdf Page 16 of 16 Needs: No; Support System Lacking:  No; Transportation Concerns: No Electronic Signature(s) Signed: 08/06/2022 2:06:49 PM By: [name] DO Entered By: [name] on 08/06/2022 13:57:01 -------------------------------------------------------------------------------- SuperBill Details [name], [name] 08/06/2022 Medical Record Number: [id number] Patient Account Number: [id number] Date of Birth/Sex: Treating RN: Nov 20, 1955 (66 y.o. [name] Primary Care Provider: [name] Other Clinician: Referring Provider: Treating Provider/Extender: [name] in Treatment: 48 Diagnosis Coding ICD-10 Codes Code Description [phone] Chronic venous hypertension (idiopathic) with ulcer and inflammation of bilateral lower extremity 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ed L97.912 Non-pressure chronic ulcer of unspecified part of right lower leg with fat layer exposed I89.0 Lymphedema, not elsewhere classified E44.0 Moderate protein-calorie malnutrition E11.622 Type 2 diabetes mellitus with other skin ulcer X32.35 Chronic diastolic (congestive) heart failure Facility Procedures : CPT4 Code: [id number] Description: 11042 - DEB SUBQ TISSUE 20 SQ CM/< ICD-10 Diagnosis Description 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 L97.912 Non-pressure chronic ulcer of unspecified part of right lower leg with fat laye Modifier: ed r exposed Quantity: 1 : CPT4 Code: [id number] Description: 11045 - DEB SUBQ TISS EA ADDL 20CM ICD-10 Diagnosis Description 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 Modifier: ed Quantity: 16 Physician Procedures : CPT4 Code Description Modifier [id number] 11042 - WC PHYS SUBQ TISS 20 SQ CM ICD-10 Diagnosis Description L97.822  Non-pressure chronic ulcer of other part of left lower leg with fat layer exposed  L97.912 Non-pressure chronic ulcer of unspecified part of  right lower leg with fat layer exposed Quantity: 1 : [id number] 11045 - WC PHYS SUBQ TISS EA ADDL 20 CM ICD-10 Diagnosis Description 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ed Quantity: 16 Electronic Signature(s) Signed: 08/06/2022 2:06:49 PM By: [name] DO Entered By: [name] on 08/06/2022 14:01:50

## 2022-08-09 DIAGNOSIS — N179 Acute kidney failure, unspecified: Secondary | ICD-10-CM | POA: Diagnosis not present

## 2022-08-09 DIAGNOSIS — M6281 Muscle weakness (generalized): Secondary | ICD-10-CM | POA: Diagnosis not present

## 2022-08-09 DIAGNOSIS — R2689 Other abnormalities of gait and mobility: Secondary | ICD-10-CM | POA: Diagnosis not present

## 2022-08-09 NOTE — Progress Notes (Signed)
Corson, Grainger ([id number]) [id number] Page 1 of 12 Visit Report for 08/06/2022 Arrival Information Details [name], [name] 08/06/2022 12:45 PM Medical Record Number: [id number] Patient Account Number: [id number] Date of Birth/Sex: Treating RN: Feb 05, 1956 (66 y.o. [name], Lauren Primary Care Nyshawn Gowdy: [name] Other Clinician: Referring Auriel Kist: Treating Kyira Volkert/Extender: [name] in Treatment: 72 Visit Information History Since Last Visit Added or deleted any medications: No Patient Arrived: Wheel Chair Any new allergies or adverse reactions: No Arrival Time: 12:56 Had a fall or experienced change in No Accompanied By: self activities of daily living that may affect Transfer Assistance: Manual risk of falls: Patient Identification Verified: Yes Signs or symptoms of abuse/neglect since last visito No Secondary Verification Process Completed: Yes Hospitalized since last visit: No Patient Requires Transmission-Based No Implantable device outside of the clinic excluding No Precautions: cellular tissue based products placed in the center Patient Has Alerts: Yes since last visit: Patient Alerts: Patient on Blood Thinner Has Dressing in Place as Prescribed: Yes *NO BLOOD Has Compression in Place as Prescribed: Yes PRODUCTS* Pain Present Now: Yes Electronic Signature(s) Signed: 08/09/2022 12:09:11 PM By: Rhae Hammock RN Entered By: Rhae Hammock on 08/06/2022 12:57:06 -------------------------------------------------------------------------------- Lower Extremity Assessment Details [name], [name] 08/06/2022 12:45 PM Medical Record Number: [id number] Patient Account Number: [id number] Date of Birth/Sex: Treating RN: [birth year] (66 y.o. [name] Primary Care Bekka Qian: [name] Other Clinician: Referring Kyrin Garn: Treating  Durant Scibilia/Extender: [name] in Treatment: 59 Edema Assessment Assessed: Shirlyn Goltz: Yes] Patrice Paradise: Yes] Edema: [Left: Yes] [Right: Yes] Calf Left: Right: Point of Measurement: 37 cm From Medial Instep 39.5 cm 39 cm Ankle Left: Right: Point of Measurement: 12 cm From Medial Instep 26 cm 26 cm Vascular Assessment Pulses: Dorsalis Pedis Palpable: [Left:Yes] [Right:Yes] Allbright, Jorge ([id number]) [[id number] Page 2 of 12] Posterior Tibial Palpable: [Left:Yes] [Right:Yes] Electronic Signature(s) Signed: 08/09/2022 12:09:11 PM By: Rhae Hammock RN Entered By: Rhae Hammock on 08/06/2022 12:57:39 -------------------------------------------------------------------------------- Multi Wound Chart Details [name], [name] 08/06/2022 12:45 PM Medical Record Number: [id number] Patient Account Number: [id number] Date of Birth/Sex: Treating RN: 02/05/1956 (66 y.o. M) Primary Care Dajaun Goldring: [name] Other Clinician: Referring Minami Arriaga: Treating Christeena Krogh/Extender: [name] in Treatment: 28 [25:Photos:] Right, Medial Lower Leg Left, Posterior Lower Leg Right, Lateral Lower Leg Wound Location: Gradually Appeared Gradually Appeared Gradually Appeared Wounding Event: Venous Leg Ulcer Venous Leg Ulcer Venous Leg Ulcer Primary Etiology: Anemia, Sleep Apnea, Congestive Anemia, Sleep Apnea, Congestive Anemia, Sleep Apnea, Congestive Comorbid History: Heart Failure, Coronary Artery Heart Failure, Coronary Artery Heart Failure, Coronary Artery Disease, Hypertension, Peripheral Disease, Hypertension, Peripheral Disease, Hypertension, Peripheral Venous Disease, Type II Diabetes, Venous Disease, Type II Diabetes, Venous Disease, Type II Diabetes, Osteoarthritis, Neuropathy Osteoarthritis, Neuropathy Osteoarthritis, Neuropathy 09/07/2021 09/07/2021 04/30/2022 Date Acquired: 92 43  14 Weeks of Treatment: Open Open Open Wound Status: No No No Wound Recurrence: 5.5x2.7x0.2 19x17x0.4 1.2x1x0.3 Measurements L x W x D (cm) 11.663 253.684 0.942 A (cm) : rea 2.333 101.473 0.283 Volume (cm) : 2.90% 2.10% -55.70% % Reduction in A rea: 35.30% -30.50% -133.90% % Reduction in Volume: Full Thickness Without Exposed Full Thickness With Exposed Support Full Thickness With Exposed Support Classification: Support Structures Structures Structures Medium Medium Medium Exudate A mount: Serosanguineous Serosanguineous Serosanguineous Exudate Type: red, brown red, brown red, brown Exudate Color: Thickened Distinct, outline attached Distinct, outline attached Wound Margin: Large (67-100%) Medium (34-66%)  Medium (34-66%) Granulation A mount: Red, Pink Red, Friable Red, Pink Granulation Quality: None Present (0%) Medium (34-66%) Medium (34-66%) Necrotic A mount: N/A Eschar, Adherent Slough Adherent Slough Necrotic Tissue: Fat Layer (Subcutaneous Tissue): Yes Fat Layer (Subcutaneous Tissue): Yes Fat Layer (Subcutaneous Tissue): Yes Exposed Structures: Fascia: No Fascia: No Fascia: No Tendon: No Tendon: No Tendon: No Muscle: No Muscle: No Muscle: No Joint: No Joint: No Joint: No Bone: No Bone: No Bone: No Small (1-33%) Small (1-33%) None Epithelialization: Debridement - Excisional Debridement - Excisional Debridement - Excisional Debridement: Pre-procedure Verification/Time Out 13:15 13:15 13:15 Taken: Lidocaine 4% Topical Solution Lidocaine 4% Topical Solution Lidocaine 4% Topical Solution Pain Control: Subcutaneous, Slough Subcutaneous, Slough Subcutaneous, Slough Tissue Debrided: Skin/Subcutaneous Tissue Skin/Subcutaneous Tissue Skin/Subcutaneous Tissue Level: 14.85 323 1.2 Debridement A (sq cm): rea Curette Curette Curette Instrument: Minimum Minimum Minimum Bleeding: Pressure Pressure Pressure Hemostasis A chieved: 0 0 0 Procedural  Pain: Munyon, Leonte ([id number]) [id number] Page 3 of 12 0 0 0 Post Procedural Pain: Procedure was tolerated well Procedure was tolerated well Procedure was tolerated well Debridement Treatment Response: 5.5x2.7x0.2 19x17x0.4 1.2x1x0.3 Post Debridement Measurements L x W x D (cm) 2.333 101.473 0.283 Post Debridement Volume: (cm) Excoriation: No Excoriation: No Excoriation: No Periwound Skin Texture: Induration: No Induration: No Induration: No Callus: No Callus: No Callus: No Crepitus: No Crepitus: No Crepitus: No Rash: No Rash: No Rash: No Scarring: No Scarring: No Scarring: No Maceration: No Maceration: No Maceration: No Periwound Skin Moisture: Dry/Scaly: No Dry/Scaly: No Dry/Scaly: No Atrophie Blanche: No Atrophie Blanche: No Atrophie Blanche: No Periwound Skin Color: Cyanosis: No Cyanosis: No Cyanosis: No Ecchymosis: No Ecchymosis: No Ecchymosis: No Erythema: No Erythema: No Erythema: No Hemosiderin Staining: No Hemosiderin Staining: No Hemosiderin Staining: No Mottled: No Mottled: No Mottled: No Pallor: No Pallor: No Pallor: No Rubor: No Rubor: No Rubor: No N/A No Abnormality N/A Temperature: N/A Yes N/A Tenderness on Palpation: Debridement Debridement Debridement Procedures Performed: Treatment Notes Wound #25 (Lower Leg) Wound Laterality: Right, Medial Cleanser Soap and Water Discharge Instruction: May shower and wash wound with dial antibacterial soap and water prior to dressing change. Wound Cleanser Discharge Instruction: Cleanse the wound with wound cleanser prior to applying a clean dressing using gauze sponges, not tissue or cotton balls. Peri-Wound Care Sween Lotion (Moisturizing lotion) Discharge Instruction: Apply moisturizing lotion as directed Topical Primary Dressing Maxorb Extra Calcium Alginate Dressing, 4x4 in Discharge Instruction: Apply calcium alginate over the cutimed to help absorb  drainage. Cutimed Sorbact Swab Discharge Instruction: Apply to wound bed as instructed Keystone Antibiotic spray Secondary Dressing ABD Pad, 8x10 Discharge Instruction: Apply over primary dressing as directed. Secured With Compression Wrap Kerlix Roll 4.5x3.1 (in/yd) Discharge Instruction: Apply Kerlix and Coban compression as directed. Coban Self-Adherent Wrap 4x5 (in/yd) Discharge Instruction: Apply over Kerlix as directed. Compression Stockings Add-Ons Wound #27 (Lower Leg) Wound Laterality: Left, Posterior Cleanser Soap and Water Discharge Instruction: May shower and wash wound with dial antibacterial soap and water prior to dressing change. Wound Cleanser Discharge Instruction: Cleanse the wound with wound cleanser prior to applying a clean dressing using gauze sponges, not tissue or cotton balls. Peri-Wound Care Sween Lotion (Moisturizing lotion) Discharge Instruction: Apply moisturizing lotion as directed Topical Hemric, Atharv ([id number]) [id number] Page 4 of 12 Primary Dressing Maxorb Extra Calcium Alginate Dressing, 4x4 in Discharge Instruction: Apply calcium alginate over the cutimed to help absorb drainage. Cutimed Sorbact Swab Discharge Instruction: Apply to wound bed as instructed Keystone Antibiotic spray Secondary Dressing ABD Pad, 8x10 Discharge Instruction:  Apply over primary dressing as directed. Secured With Compression Wrap Kerlix Roll 4.5x3.1 (in/yd) Discharge Instruction: Apply Kerlix and Coban compression as directed. Coban Self-Adherent Wrap 4x5 (in/yd) Discharge Instruction: Apply over Kerlix as directed. Compression Stockings Add-Ons Wound #29 (Lower Leg) Wound Laterality: Right, Lateral Cleanser Soap and Water Discharge Instruction: May shower and wash wound with dial antibacterial soap and water prior to dressing change. Wound Cleanser Discharge Instruction: Cleanse the wound with wound cleanser prior to applying a  clean dressing using gauze sponges, not tissue or cotton balls. Peri-Wound Care Sween Lotion (Moisturizing lotion) Discharge Instruction: Apply moisturizing lotion as directed Topical Primary Dressing Maxorb Extra Calcium Alginate Dressing, 4x4 in Discharge Instruction: Apply calcium alginate over the cutimed to help absorb drainage. Cutimed Sorbact Swab Discharge Instruction: Apply to wound bed as instructed Keystone Antibiotic spray Secondary Dressing ABD Pad, 8x10 Discharge Instruction: Apply over primary dressing as directed. Secured With Compression Wrap Kerlix Roll 4.5x3.1 (in/yd) Discharge Instruction: Apply Kerlix and Coban compression as directed. Coban Self-Adherent Wrap 4x5 (in/yd) Discharge Instruction: Apply over Kerlix as directed. Compression Stockings Add-Ons Electronic Signature(s) Signed: 08/06/2022 2:06:49 PM By: [name] DO Entered By: [name] on 08/06/2022 13:55:57 Stinson, Sehaj ([id number]) [id number] Page 5 of 12 -------------------------------------------------------------------------------- Multi-Disciplinary Care Plan Details [name], [name] 08/06/2022 12:45 PM Medical Record Number: [id number] Patient Account Number: [id number] Date of Birth/Sex: Treating RN: [birth year] (66 y.o. [name] Primary Care Symia Herdt: [name] Other Clinician: Referring Bradee Common: Treating Daniyah Fohl/Extender: [name] in Treatment: Ro[place] reviewed with physician Active Inactive Venous Leg Ulcer Nursing Diagnoses: Actual venous Insuffiency (use after diagnosis is confirmed) Goals: Patient will maintain optimal edema control Date Initiated: 10/08/2021 Target Resolution Date: 11/21/2022 Goal Status: Active Interventions: Compression as ordered Provide education on venous insufficiency Treatment Activities: Therapeutic compression  applied : 10/08/2021 Notes: Wound/Skin Impairment Nursing Diagnoses: Impaired tissue integrity Goals: Patient/caregiver will verbalize understanding of skin care regimen Date Initiated: 10/08/2021 Target Resolution Date: 10/17/2022 Goal Status: Active Ulcer/skin breakdown will have a volume reduction of 30% by week 4 Date Initiated: 10/08/2021 Date Inactivated: 11/05/2021 Target Resolution Date: 11/05/2021 Goal Status: Unmet Unmet Reason: infection Ulcer/skin breakdown will have a volume reduction of 50% by week 8 Date Initiated: 11/05/2021 Date Inactivated: 06/04/2022 Target Resolution Date: 05/25/2022 Goal Status: Met Interventions: Assess patient/caregiver ability to perform ulcer/skin care regimen upon admission and as needed Assess ulceration(s) every visit Provide education on ulcer and skin care Treatment Activities: Topical wound management initiated : 10/08/2021 Notes: Electronic Signature(s) Signed: 08/06/2022 5:53:09 PM By: [name] RN, BSN Entered By: [name] on 08/06/2022 13:13:35 -------------------------------------------------------------------------------- Pain Assessment Details [name], [name] 08/06/2022 12:45 PM Medical Record Number: [id number] Patient Account Number: [id number] Date of Birth/Sex: Treating RN: 08-19-1956 (66 y.o. [name], Lauren Srinivasan, Hiroto ([id number]) [id number].pdf Page 6 of 12 Primary Care Mariaelena Cade: [name] Other Clinician: Referring Crew Goren: Treating Haydin Dunn/Extender: [name] in Treatment: 50 Active Problems Location of Pain Severity and Description of Pain Patient Has Paino Yes Site Locations Pain Location: Generalized Pain, Pain in Ulcers With Dressing Change: Yes Duration of the Pain. Constant / Intermittento Intermittent Rate the pain. Current Pain Level: 7 Worst Pain Level: 10 Least Pain Level: 0 Tolerable Pain Level:  7 Character of Pain Describe the Pain: Aching Pain Management and Medication Current Pain Management: Medication: No Cold Application: No Rest: No Massage: No Activity: No T.E.N.S.: No Heat Application: No  Leg drop or elevation: No Is the Current Pain Management Adequate: Adequate How does your wound impact your activities of daily livingo Sleep: No Bathing: No Appetite: No Relationship With Others: No Bladder Continence: No Emotions: No Bowel Continence: No Work: No Toileting: No Drive: No Dressing: No Hobbies: No Electronic Signature(s) Signed: 08/09/2022 12:09:11 PM By: Rhae Hammock RN Entered By: Rhae Hammock on 08/06/2022 12:57:30 -------------------------------------------------------------------------------- Patient/Caregiver Education Details Patient Name: Date of Service: Lorel Monaco 11/14/2023andnbsp12:45 PM Medical Record Number: [id number] Patient Account Number: [id number] Date of Birth/Gender: Treating RN: 1956-09-13 (66 y.o. [name] Primary Care Physician: [name] Other Clinician: Referring Physician: Treating Physician/Extender: [name] in Treatment: 4 Education Assessment Education Provided To: Patient Theard, Chancey ([id number]) [id number] Page 7 of 12 Education Topics Provided Wound/Skin Impairment: Handouts: Caring for Your Ulcer Methods: Explain/Verbal Responses: Reinforcements needed Electronic Signature(s) Signed: 08/06/2022 5:53:09 PM By: [name] RN, BSN Entered By: [name] on 08/06/2022 13:13:50 -------------------------------------------------------------------------------- Wound Assessment Details [name], [name] 08/06/2022 12:45 PM Medical Record Number: [id number] Patient Account Number: [id number] Date of Birth/Sex: Treating RN: 1956-06-11 (66 y.o. [name], Lauren Primary Care Aaric Dolph: [name]  Other Clinician: Referring Khamille Beynon: Treating Adamary Savary/Extender: [name] in Treatment: 56 Wound Status Wound Number: 25 Primary Venous Leg Ulcer Etiology: Wound Location: Right, Medial Lower Leg Wound Open Wounding Event: Gradually Appeared Status: Date Acquired: 09/07/2021 Comorbid Anemia, Sleep Apnea, Congestive Heart Failure, Coronary Artery Weeks Of Treatment: 43 History: Disease, Hypertension, Peripheral Venous Disease, Type II Clustered Wound: No Diabetes, Osteoarthritis, Neuropathy Photos Wound Measurements Length: (cm) 5.5 Width: (cm) 2.7 Depth: (cm) 0.2 Area: (cm) 11.663 Volume: (cm) 2.333 % Reduction in Area: 2.9% % Reduction in Volume: 35.3% Epithelialization: Small (1-33%) Tunneling: No Undermining: No Wound Description Classification: Full Thickness Without Exposed Suppor Wound Margin: Thickened Exudate Amount: Medium Exudate Type: Serosanguineous Exudate Color: red, brown t Structures Foul Odor After Cleansing: No Slough/Fibrino No Wound Bed Granulation Amount: Large (67-100%) Exposed Structure Granulation Quality: Red, Pink Fascia Exposed: No Necrotic Amount: None Present (0%) Fat Layer (Subcutaneous Tissue) Exposed: Yes Tendon Exposed: No Muscle Exposed: No Joint Exposed: No Bone Exposed: No Corallo, Dreyton ([id number]) [id number] Page 8 of 12 Periwound Skin Texture Texture Color No Abnormalities Noted: No No Abnormalities Noted: No Callus: No Atrophie Blanche: No Crepitus: No Cyanosis: No Excoriation: No Ecchymosis: No Induration: No Erythema: No Rash: No Hemosiderin Staining: No Scarring: No Mottled: No Pallor: No Moisture Rubor: No No Abnormalities Noted: No Dry / Scaly: No Maceration: No Treatment Notes Wound #25 (Lower Leg) Wound Laterality: Right, Medial Cleanser Soap and Water Discharge Instruction: May shower and wash wound with dial antibacterial soap and water  prior to dressing change. Wound Cleanser Discharge Instruction: Cleanse the wound with wound cleanser prior to applying a clean dressing using gauze sponges, not tissue or cotton balls. Peri-Wound Care Sween Lotion (Moisturizing lotion) Discharge Instruction: Apply moisturizing lotion as directed Topical Primary Dressing Maxorb Extra Calcium Alginate Dressing, 4x4 in Discharge Instruction: Apply calcium alginate over the cutimed to help absorb drainage. Cutimed Sorbact Swab Discharge Instruction: Apply to wound bed as instructed Keystone Antibiotic spray Secondary Dressing ABD Pad, 8x10 Discharge Instruction: Apply over primary dressing as directed. Secured With Compression Wrap Kerlix Roll 4.5x3.1 (in/yd) Discharge Instruction: Apply Kerlix and Coban compression as directed. Coban Self-Adherent Wrap 4x5 (in/yd) Discharge Instruction: Apply over Kerlix as directed. Compression Stockings Add-Ons Electronic Signature(s) Signed: 08/09/2022 12:09:11 PM By: Rhae Hammock RN  Entered By: Rhae Hammock on 08/06/2022 13:04:24 -------------------------------------------------------------------------------- Wound Assessment Details [name], [name] 08/06/2022 12:45 PM Medical Record Number: [id number] Patient Account Number: [id number] Date of Birth/Sex: Treating RN: December 06, 1955 (66 y.o. [name] Primary Care Lamyia Cdebaca: [name] Other Clinician: Referring Gennavieve Huq: Treating Jen Benedict/Extender: [name] in Treatment: 64 Towery, Booker ([id number]) [id number] Page 9 of 12 Wound Status Wound Number: 27 Primary Venous Leg Ulcer Etiology: Wound Location: Left, Posterior Lower Leg Wound Open Wounding Event: Gradually Appeared Status: Date Acquired: 09/07/2021 Comorbid Anemia, Sleep Apnea, Congestive Heart Failure, Coronary Artery Weeks Of Treatment: 43 History: Disease,  Hypertension, Peripheral Venous Disease, Type II Clustered Wound: No Diabetes, Osteoarthritis, Neuropathy Photos Wound Measurements Length: (cm) 19 Width: (cm) 17 Depth: (cm) 0.4 Area: (cm) 253.684 Volume: (cm) 101.473 % Reduction in Area: 2.1% % Reduction in Volume: -30.5% Epithelialization: Small (1-33%) Tunneling: No Undermining: No Wound Description Classification: Full Thickness With Exposed Support Structures Wound Margin: Distinct, outline attached Exudate Amount: Medium Exudate Type: Serosanguineous Exudate Color: red, brown Foul Odor After Cleansing: No Slough/Fibrino No Wound Bed Granulation Amount: Medium (34-66%) Exposed Structure Granulation Quality: Red, Friable Fascia Exposed: No Necrotic Amount: Medium (34-66%) Fat Layer (Subcutaneous Tissue) Exposed: Yes Necrotic Quality: Eschar, Adherent Slough Tendon Exposed: No Muscle Exposed: No Joint Exposed: No Bone Exposed: No Periwound Skin Texture Texture Color No Abnormalities Noted: No No Abnormalities Noted: No Callus: No Atrophie Blanche: No Crepitus: No Cyanosis: No Excoriation: No Ecchymosis: No Induration: No Erythema: No Rash: No Hemosiderin Staining: No Scarring: No Mottled: No Pallor: No Moisture Rubor: No No Abnormalities Noted: No Dry / Scaly: No Temperature / Pain Maceration: No Temperature: No Abnormality Tenderness on Palpation: Yes Treatment Notes Wound #27 (Lower Leg) Wound Laterality: Left, Posterior Cleanser Soap and Water Discharge Instruction: May shower and wash wound with dial antibacterial soap and water prior to dressing change. Wound Cleanser Discharge Instruction: Cleanse the wound with wound cleanser prior to applying a clean dressing using gauze sponges, not tissue or cotton balls. Peri-Wound Care Sween Lotion (Moisturizing lotion) Kimber, Elward ([id number]) [phone].pdf Page 10 of 12 Discharge Instruction: Apply moisturizing lotion as  directed Topical Primary Dressing Maxorb Extra Calcium Alginate Dressing, 4x4 in Discharge Instruction: Apply calcium alginate over the cutimed to help absorb drainage. Cutimed Sorbact Swab Discharge Instruction: Apply to wound bed as instructed Keystone Antibiotic spray Secondary Dressing ABD Pad, 8x10 Discharge Instruction: Apply over primary dressing as directed. Secured With Compression Wrap Kerlix Roll 4.5x3.1 (in/yd) Discharge Instruction: Apply Kerlix and Coban compression as directed. Coban Self-Adherent Wrap 4x5 (in/yd) Discharge Instruction: Apply over Kerlix as directed. Compression Stockings Add-Ons Electronic Signature(s) Signed: 08/09/2022 12:09:11 PM By: Rhae Hammock RN Entered By: Rhae Hammock on 08/06/2022 13:05:19 -------------------------------------------------------------------------------- Wound Assessment Details [name], [name] 08/06/2022 12:45 PM Medical Record Number: [id number] Patient Account Number: [id number] Date of Birth/Sex: Treating RN: [birth year] (66 y.o. [name], Lauren Primary Care Merlyn Bollen: [name] Other Clinician: Referring Ritter Helsley: Treating Tallula Grindle/Extender: [name] in Treatment: 27 Wound Status Wound Number: 29 Primary Venous Leg Ulcer Etiology: Wound Location: Right, Lateral Lower Leg Wound Open Wounding Event: Gradually Appeared Status: Date Acquired: 04/30/2022 Comorbid Anemia, Sleep Apnea, Congestive Heart Failure, Coronary Artery Weeks Of Treatment: 14 History: Disease, Hypertension, Peripheral Venous Disease, Type II Clustered Wound: No Diabetes, Osteoarthritis, Neuropathy Photos Wound Measurements Length: (cm) 1.2 Width: (cm) 1 Depth: (cm) 0.3 Tworek, Xavius ([id number]) Area: (cm) 0.942 Volume: (cm) 0.283 % Reduction  in Area: -55.7% % Reduction in Volume: -133.9% Epithelialization: None [id number] Page 11 of  12 Tunneling: No Undermining: No Wound Description Classification: Full Thickness With Exposed Support Structures Wound Margin: Distinct, outline attached Exudate Amount: Medium Exudate Type: Serosanguineous Exudate Color: red, brown Foul Odor After Cleansing: No Slough/Fibrino Yes Wound Bed Granulation Amount: Medium (34-66%) Exposed Structure Granulation Quality: Red, Pink Fascia Exposed: No Necrotic Amount: Medium (34-66%) Fat Layer (Subcutaneous Tissue) Exposed: Yes Necrotic Quality: Adherent Slough Tendon Exposed: No Muscle Exposed: No Joint Exposed: No Bone Exposed: No Periwound Skin Texture Texture Color No Abnormalities Noted: No No Abnormalities Noted: No Callus: No Atrophie Blanche: No Crepitus: No Cyanosis: No Excoriation: No Ecchymosis: No Induration: No Erythema: No Rash: No Hemosiderin Staining: No Scarring: No Mottled: No Pallor: No Moisture Rubor: No No Abnormalities Noted: No Dry / Scaly: No Maceration: No Treatment Notes Wound #29 (Lower Leg) Wound Laterality: Right, Lateral Cleanser Soap and Water Discharge Instruction: May shower and wash wound with dial antibacterial soap and water prior to dressing change. Wound Cleanser Discharge Instruction: Cleanse the wound with wound cleanser prior to applying a clean dressing using gauze sponges, not tissue or cotton balls. Peri-Wound Care Sween Lotion (Moisturizing lotion) Discharge Instruction: Apply moisturizing lotion as directed Topical Primary Dressing Maxorb Extra Calcium Alginate Dressing, 4x4 in Discharge Instruction: Apply calcium alginate over the cutimed to help absorb drainage. Cutimed Sorbact Swab Discharge Instruction: Apply to wound bed as instructed Keystone Antibiotic spray Secondary Dressing ABD Pad, 8x10 Discharge Instruction: Apply over primary dressing as directed. Secured With Compression Wrap Kerlix Roll 4.5x3.1 (in/yd) Discharge Instruction: Apply Kerlix and Coban  compression as directed. Coban Self-Adherent Wrap 4x5 (in/yd) Discharge Instruction: Apply over Kerlix as directed. Compression Stockings Add-Ons Electronic Signature(s) Tursi, Lela ([id number]) [phone].pdf Page 12 of 12 Signed: 08/09/2022 12:09:11 PM By: Rhae Hammock RN Entered By: Rhae Hammock on 08/06/2022 13:04:46

## 2022-08-10 DIAGNOSIS — R2689 Other abnormalities of gait and mobility: Secondary | ICD-10-CM | POA: Diagnosis not present

## 2022-08-10 DIAGNOSIS — N179 Acute kidney failure, unspecified: Secondary | ICD-10-CM | POA: Diagnosis not present

## 2022-08-10 DIAGNOSIS — M6281 Muscle weakness (generalized): Secondary | ICD-10-CM | POA: Diagnosis not present

## 2022-08-12 DIAGNOSIS — M6281 Muscle weakness (generalized): Secondary | ICD-10-CM | POA: Diagnosis not present

## 2022-08-12 DIAGNOSIS — R2689 Other abnormalities of gait and mobility: Secondary | ICD-10-CM | POA: Diagnosis not present

## 2022-08-12 DIAGNOSIS — N179 Acute kidney failure, unspecified: Secondary | ICD-10-CM | POA: Diagnosis not present

## 2022-08-13 DIAGNOSIS — M6281 Muscle weakness (generalized): Secondary | ICD-10-CM | POA: Diagnosis not present

## 2022-08-13 DIAGNOSIS — R2689 Other abnormalities of gait and mobility: Secondary | ICD-10-CM | POA: Diagnosis not present

## 2022-08-13 DIAGNOSIS — N179 Acute kidney failure, unspecified: Secondary | ICD-10-CM | POA: Diagnosis not present

## 2022-08-15 DIAGNOSIS — N179 Acute kidney failure, unspecified: Secondary | ICD-10-CM | POA: Diagnosis not present

## 2022-08-15 DIAGNOSIS — R2689 Other abnormalities of gait and mobility: Secondary | ICD-10-CM | POA: Diagnosis not present

## 2022-08-15 DIAGNOSIS — D500 Iron deficiency anemia secondary to blood loss (chronic): Secondary | ICD-10-CM | POA: Diagnosis not present

## 2022-08-15 DIAGNOSIS — Z79899 Other long term (current) drug therapy: Secondary | ICD-10-CM | POA: Diagnosis not present

## 2022-08-15 DIAGNOSIS — M6281 Muscle weakness (generalized): Secondary | ICD-10-CM | POA: Diagnosis not present

## 2022-08-15 DIAGNOSIS — E1122 Type 2 diabetes mellitus with diabetic chronic kidney disease: Secondary | ICD-10-CM | POA: Diagnosis not present

## 2022-08-15 DIAGNOSIS — G890 Central pain syndrome: Secondary | ICD-10-CM | POA: Diagnosis not present

## 2022-08-15 DIAGNOSIS — E118 Type 2 diabetes mellitus with unspecified complications: Secondary | ICD-10-CM | POA: Diagnosis not present

## 2022-08-16 DIAGNOSIS — N179 Acute kidney failure, unspecified: Secondary | ICD-10-CM | POA: Diagnosis not present

## 2022-08-16 DIAGNOSIS — R2689 Other abnormalities of gait and mobility: Secondary | ICD-10-CM | POA: Diagnosis not present

## 2022-08-16 DIAGNOSIS — M6281 Muscle weakness (generalized): Secondary | ICD-10-CM | POA: Diagnosis not present

## 2022-08-17 DIAGNOSIS — M6281 Muscle weakness (generalized): Secondary | ICD-10-CM | POA: Diagnosis not present

## 2022-08-17 DIAGNOSIS — N179 Acute kidney failure, unspecified: Secondary | ICD-10-CM | POA: Diagnosis not present

## 2022-08-17 DIAGNOSIS — R2689 Other abnormalities of gait and mobility: Secondary | ICD-10-CM | POA: Diagnosis not present

## 2022-08-19 DIAGNOSIS — R2689 Other abnormalities of gait and mobility: Secondary | ICD-10-CM | POA: Diagnosis not present

## 2022-08-19 DIAGNOSIS — N179 Acute kidney failure, unspecified: Secondary | ICD-10-CM | POA: Diagnosis not present

## 2022-08-19 DIAGNOSIS — M6281 Muscle weakness (generalized): Secondary | ICD-10-CM | POA: Diagnosis not present

## 2022-08-19 DIAGNOSIS — E1165 Type 2 diabetes mellitus with hyperglycemia: Secondary | ICD-10-CM | POA: Diagnosis not present

## 2022-08-20 DIAGNOSIS — N179 Acute kidney failure, unspecified: Secondary | ICD-10-CM | POA: Diagnosis not present

## 2022-08-20 DIAGNOSIS — M6281 Muscle weakness (generalized): Secondary | ICD-10-CM | POA: Diagnosis not present

## 2022-08-20 DIAGNOSIS — R2689 Other abnormalities of gait and mobility: Secondary | ICD-10-CM | POA: Diagnosis not present

## 2022-08-21 DIAGNOSIS — R2689 Other abnormalities of gait and mobility: Secondary | ICD-10-CM | POA: Diagnosis not present

## 2022-08-21 DIAGNOSIS — N179 Acute kidney failure, unspecified: Secondary | ICD-10-CM | POA: Diagnosis not present

## 2022-08-21 DIAGNOSIS — I509 Heart failure, unspecified: Secondary | ICD-10-CM | POA: Diagnosis not present

## 2022-08-21 DIAGNOSIS — M6281 Muscle weakness (generalized): Secondary | ICD-10-CM | POA: Diagnosis not present

## 2022-08-22 DIAGNOSIS — N179 Acute kidney failure, unspecified: Secondary | ICD-10-CM | POA: Diagnosis not present

## 2022-08-22 DIAGNOSIS — R2689 Other abnormalities of gait and mobility: Secondary | ICD-10-CM | POA: Diagnosis not present

## 2022-08-22 DIAGNOSIS — M6281 Muscle weakness (generalized): Secondary | ICD-10-CM | POA: Diagnosis not present

## 2022-08-23 DIAGNOSIS — R2689 Other abnormalities of gait and mobility: Secondary | ICD-10-CM | POA: Diagnosis not present

## 2022-08-23 DIAGNOSIS — N179 Acute kidney failure, unspecified: Secondary | ICD-10-CM | POA: Diagnosis not present

## 2022-08-23 DIAGNOSIS — M6281 Muscle weakness (generalized): Secondary | ICD-10-CM | POA: Diagnosis not present

## 2022-09-03 ENCOUNTER — Encounter (HOSPITAL_BASED_OUTPATIENT_CLINIC_OR_DEPARTMENT_OTHER): Payer: Medicare Other | Attending: Internal Medicine | Admitting: Internal Medicine

## 2022-09-03 DIAGNOSIS — I890 Lymphedema, not elsewhere classified: Secondary | ICD-10-CM | POA: Diagnosis not present

## 2022-09-03 DIAGNOSIS — I87333 Chronic venous hypertension (idiopathic) with ulcer and inflammation of bilateral lower extremity: Secondary | ICD-10-CM | POA: Diagnosis not present

## 2022-09-03 DIAGNOSIS — E1140 Type 2 diabetes mellitus with diabetic neuropathy, unspecified: Secondary | ICD-10-CM | POA: Insufficient documentation

## 2022-09-03 DIAGNOSIS — F17210 Nicotine dependence, cigarettes, uncomplicated: Secondary | ICD-10-CM | POA: Insufficient documentation

## 2022-09-03 DIAGNOSIS — E11622 Type 2 diabetes mellitus with other skin ulcer: Secondary | ICD-10-CM | POA: Insufficient documentation

## 2022-09-03 DIAGNOSIS — I110 Hypertensive heart disease with heart failure: Secondary | ICD-10-CM | POA: Insufficient documentation

## 2022-09-03 DIAGNOSIS — I5032 Chronic diastolic (congestive) heart failure: Secondary | ICD-10-CM | POA: Insufficient documentation

## 2022-09-03 DIAGNOSIS — L97912 Non-pressure chronic ulcer of unspecified part of right lower leg with fat layer exposed: Secondary | ICD-10-CM | POA: Insufficient documentation

## 2022-09-03 DIAGNOSIS — E440 Moderate protein-calorie malnutrition: Secondary | ICD-10-CM | POA: Insufficient documentation

## 2022-09-03 DIAGNOSIS — Z6830 Body mass index (BMI) 30.0-30.9, adult: Secondary | ICD-10-CM | POA: Diagnosis not present

## 2022-09-03 DIAGNOSIS — Z794 Long term (current) use of insulin: Secondary | ICD-10-CM | POA: Insufficient documentation

## 2022-09-03 DIAGNOSIS — L97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ed: Secondary | ICD-10-CM | POA: Insufficient documentation

## 2022-09-04 NOTE — Progress Notes (Signed)
[name], [name] ([id number]) [id number] Page 1 of 14 Visit Report for 09/03/2022 Chief Complaint Document Details Patient Name: Date of Service: [name], [name] 09/03/2022 12:30 PM Medical Record Number: [id number] Patient Account Number: [id number] Date of Birth/Sex: Treating RN: [birth year] (66 y.o. M) Primary Care Provider: Seward [name] Other Clinician: Referring Provider: Treating Provider/Extender: [name] in Treatment: 54 Information Obtained from: Patient Chief Complaint 10/08/2021; bilateral lower extremity wounds Electronic Signature(s) Signed: 09/03/2022 4:30:17 PM By: [name] Shan DO Entered By: [name] on 09/03/2022 13:59:47 -------------------------------------------------------------------------------- HPI Details Patient Name: Date of Service: [name], [name] 09/03/2022 12:30 PM Medical Record Number: [id number] Patient Account Number: [id number] Date of Birth/Sex: Treating RN: Jul 21, 1956 (66 y.o. M) Primary Care Provider: Seward [name] Other Clinician: Referring Provider: Treating Provider/Extender: [name] in Treatment: 4 History of Present Illness HPI Description: 07/07/15; this is [name] patient who is in hospital on 8/2 through 8/4. He had cellulitis and abscess of predominantly I think the left leg. He received IV antibiotics. Plain x-ray showed no osteomyelitis. An MRI of the left leg did not show osteomyelitis. Cultures showed no predominant organism. His hemoglobin A1c was 9.8. He has [name] history of venous stasis also peripheral vascular disease. He was discharged to [facility]. He really has extensive ulcerations on the left lateral leg including [name] major wound that communicates both posteriorly and superiorly w. When drainage coming out of 2 smaller areas. He has [name] smaller wound on the posterior medial left leg. He has more predominantly venous  insufficiency wounds on predominantly the right medial leg above the ankle the right foot into sections. He has recently been put on Augmentin at the nursing home. Previous ABIs/arterial evaluation showed triphasic waves diffusely he does not have [name] major ischemic issue [name] left lower extremity venous duplex also exam showed no evidence of [name] DVT on 6/21 07/21/15; the patient arrives with really no major change. Culture I did last time was negative. He has not had [name] follow-up MRI I ordered. The wounds are macerated covered with [name] thick gelatinous surface slough. There is necrotic subcutaneous tissue 08/04/15; the patient arrives with [name] considerable improvement in the majority of his wound area. The area on the left leg now has what looks to be [name] granulated base. Most of the wounds on the left leg required an aggressive surgical debridement to remove nonviable fibrinous eschar and subcutaneous tissue however after debridement most of this looks better. Although I had asked for repeat MRI of the left leg when he first came in here with grossly purulent material coming out of his wounds, it does not appear that this is been done and nor do I actually feel that strongly about it right now. He has severe surrounding venous insufficiency and inflammation. I don't believe he has significant PAD 08/25/15. In general there is still [name] considerable wound area here but with still extensive service adherent slough. The patient will not allow mechanical debridement due to pain. He did not tolerate Medihoney therefore we are left with Santyl for now. She would appear that he has severe surrounding venous stasis. There is no evidence of the infection that may have had something to do with the pathogenesis of these wounds 09/29/15; patient still has substantial wound area on the left leg with [name] cluster of several wounds on the lateral leg confluently on the left leg posteriorly and then [name] substantial wound on the medial leg.  On the right leg  [name] substantial wound medially and [name] small area on the right lateral foot. All of these underwent [name] substantial surgical debridement with curettes which she tolerated better than he has in the past. This started as [name] complex cellulitis in the face of chronic venous insufficiency and inflammation. 10/13/15; substantial cluster of wounds on the lateral aspect of his left leg confluently around to the other side. Extensive surgical debridement to remove for redness surface slough nonviable subcutaneous tissue. This is not an improvement. Also substantial wound on the medial right leg which is largely unchanged. The etiology of this was felt to be [name] complex cellulitis in the summer of 2016 in the face of chronic venous insufficiency and inflammation. The patient states that the wound on the right medial leg has been there for years off and on. 10/20/15; we are able to start [facility] to these extensive wound areas left greater than right on Tuesday after I spoke to the wound care nurse at the facility. He arrives here today for extensive surgical debridement for the wounds on the lateral aspect of his left leg posterior left leg, this is almost circumferential. He has [name] large wound on the medial aspect of his right leg although he finds this too painful for debridement 10/27/15; I continue to bring this patient back for frequent debridement/weekly debridement severe. We have been using Hydrofera Blue. Unfortunately this has not really had any improvement. The debridement surgery difficult and painful for the patient 11/23/15; this patient spent [name] complex hospitalization admitted with acute kidney injury, anemia, cellulitis of the lower extremity, he was felt to have sepsis [name], [name] ([id number]) [id number] Page 2 of 14 pathophysiology although his blood cultures were negative. He had plain x-rays of both legs that were negative for osseous abnormalities. He was  seen by infectious disease and placed on [name] workup for vasculitis that was negative including [name] biopsy 4 all were consistent with stasis dermatitis. Vital broad- spectrum antibiotics he continued to spike fevers. Urine and chest x-ray were negative Dopplers on admission ruled out [name] DVT All antibiotics were stopped on . 2/17. Since his return to [facility] skilled nursing facility I believe they have been using Xeroform 12/07/15 the patient arrives today with the area on his right medial leg actually looking quite stable. No debridement. The rest of his extensive wounds on the left lateral left posterior extending into the left medial leg all required extensive debridement. I think this is probably going to need to and up in the hands of plastic surgery. We'll attempt to change him back to [facility]. Arrange consultation with plastic surgery at [facility] for this almost circumferential wound on the left side 12/21/15 right leg covered in surface slough. This was debridement. Left leg extensive wounds all carefully examined. This is almost circumferential on the left side especially on the posterior calf. No debridement is necessary. 01/04/16 the patient has been to [facility] plastic surgery. Unfortunately it doesn't look like they had any of the prior workup on this patient. They're in the middle of vascular workup. It sounds as though they're applying Hydrofera Blue at the nursing home. 01/22/16; the patient has been back to see [facility]. There apparently making plans to possibly do skin grafts over his large venous insufficiency ulcerations. The patient tells me that he goes to hematology in [facility] and variably uses the term platelets red cells and the description of his problem. He is also [name] [employer] and will not allow transfusion of  blood products. I do not see any major difference in the wound on the right medial leg and circumferentially across the left medial to left lateral  leg. Did not attempt to debride these today. Readmission: 05/05/18 on evaluation today patient presents for reevaluation here in our clinic although I have previously seen him in the skilled nursing facility over the past year and [name] half when I was working in the skilled nursing facility realm instead of covering in the clinic. With that being said during that time we had initiated most recently dressings with Hydrofera Blue Dressing along with the [employer] which had done very well for him. Much better than the Kerlex Coban wraps. With that being said the patient had been doing fairly well and was making progress when I last saw him. Upon evaluation today it appears that the wounds are actually [name] little bit worse than when I last saw him although definitely not dramatically so. There does not appear to be any evidence of infection which is good news. With that being said he has been tolerating the wraps without complication his left hip still gives him [name] lot of trouble which is his main issue as far as movement is concerned. Unbeknownst to me he has not had venous studies that I can find. He previously told me he had there were arterial studies noted back from 04/27/15 which showed that he had try phasic blood flow throughout and again his test appear to be completely normal as performed by Dr. Creig Hines. With that being said I cannot find where he had venous studies. The patient still states he thinks he did these definitely had no venous intervention at this point. Upon evaluation today it does not appear to me that the patient has any evidence of cellulitis. He does have some chronic venous insufficiency which I think has led to stasis dermatitis but again this is not appearing to be infected at this point. No fevers, chills, nausea, or vomiting noted at this time. READMISSION 06/30/2018 This is [name] now 66 year old man we have had in this clinic at multiple times in the past. Most recently he  came in August and was seen by [name] [facility] stone. It is not clear why he did not come back we asked him really did not get [name] straight answer. He is at [facility] skilled facility he is [name] man who has severe chronic venous insufficiency with lymphedema and has had severe wounds on his left greater than right leg. We had him here in 2016 and 17 ultimately referred him to [facility]. He had arterial studies and venous studies done at [facility] although I am not able to access these records I see they are actually done. He also had multiple biopsies that were negative for malignancy showing changes compatible with chronic inflammation. As I understand things in [place] they are using Iodoflex and Unna boots. I am not really sure how they are getting enough Iodoflex for the large wound area especially on his left calf. Nevertheless the wounds look better than when I saw this in 2017. There were plans for him to have plastic surgery in 2018 and I think he was actually prepped for surgery however he was ultimately denied because the patient was an active smoker. The patient is not systemically unwell. The wounds are painful however given the size especially on the left this is not surprising. ABIs in this clinic were 0.78 on the left and 0.91 on the right  07/13/2018; this is [name] patient with bilateral severe venous ulcers with secondary lymphedema. The wounds are on the right medial calf and [name] substantial part of the left posterior calf and some involvement medially and laterally on the left. We changed him to silver alginate under Unna boot's last time. The wounds actually look fairly satisfactory today. 08/14/2018; this is [name] patient with severe bilateral venous stasis ulcers with secondary lymphedema left greater than right he is at [facility] using silver alginate under Unna boot's I do not think there is much change on this visit versus last time I saw him [name] month ago Readmission: 11/04/18 patient  presents today for follow-up concerning his bilateral lower extremity ulcers. His [name] right medial ankle ulcer and [name] left leg that has ulcers over [name] large proportion of the surface area between the ankle and knee. Unfortunately this does cause some discomfort for the patient although it doesn't seem to be as uncomfortable as it has been in the past. He is seen today for evaluation after [name] referral by [place] back to the clinic. Unfortunately has [name] lot of Slough noted on the surface of his wounds. He's been treated currently it sounds like with Dakin's soaked gauze and they have not been performing the Unna Boot wrap that was previously recommended. I'm not exactly sure what the reason for the change was. He does have less slough on the surface of the wound but again this is something that is often [name] constant issue for him. Again he's had these wounds for many years. I've known him for two years at least during that time when I was taking care of them in the facility he is Artie had the wounds for several years prior. READMISSION 02/25/2020 [name] is [name] now 66 year old man. He has been in our clinic several times before most extensively in October 2016 through May 2017. At that time he had absolutely substantial bilateral lower extremity wounds secondary to chronic venous insufficiency and lymphedema. We ultimately referred him to [facility] he had [name] series of biopsies only showed suggestion of wound secondary to chronic venous insufficiency. He had [name] less extensive stay in our clinic in 2019 and then [name] single visit in February 2020. He is [name] resident at [facility] skilled facility. I think he has had intermittently had in facility wound care. He returns today. They are using silver alginate on the wounds although I am not sure what type of compression. Previously he has favored Unna boots. He is not felt to have an arterial issue. Past medical history; lymphedema and chronic venous insufficiency,  diabetes mellitus, hypertension, congestive heart failure We did not do arterial studies today 04/27/2020 on evaluation patient appears to be doing really about the same as when I have known him and seen him in the past. He does have wounds on the right and left legs at this point. Fortunately there is no signs of active infection at this time. 9/2; 1 month follow-up. This is [name] man with severe chronic venous insufficiency and lymphedema. When I first met him he had horrible circumferential bilateral lower extremity ulcers. We have been using silver alginate up until early last month when it was changed to [facility] and Unna boot's more or less says maintenance dressings. Since the last time he was here the facility where he resides [facility] called to report they could no longer do Unna boot so he is apparently only been receiving kerlix Coban the left leg is certainly  [name] lot worse with almost circumferential large wounds especially lateral but now medial and posterior as well. He has [name] standard same looking wound on the right medial lower leg. 10/1; absolutely no improvement here. In fact I do not think anything much is changed. The substantial area on his left lateral and left posterior calf is still open may be slightly larger. He has [name] more modest wound on the right medial calf. None of these look any different. He comes in with [name] kerlix Coban wrap this will simply not be adequate. He has too much edema in this leg He also is complaining [name] lot of pain in his left heel area. 11/8; potential wounds on the left lateral and posterior calf and [name] smaller oval wound in the right medial. We have been using Hydrofera Blue under 4-layer compression. He was using Unna boots still 2 months ago the facility called to say they can no longer place these so we have been using 4-layer compression. The surface area of the area on the left is so large there is very few alternatives to what we can use on this  either silver alginate or Hydrofera Torbeck, Judah ([id number]) [phone].pdf Page 3 of 14 Blue. He was complaining of pain on the left heel last time I asked for an x-ray this was apparently done although we do not have the result. He is not complaining of pain today. 08/16/2020 on evaluation today patient is is seen for [name] early visit due to the fact that he apparently is having issues I was told when they called on Monday with [name] MRSA infection that they felt like we needed to see him for because his legs "looked horrible". With that being said based on what I see at this point it does not appear that the patient is actually having [name] terrible infection in fact compared to last time I saw him his legs do not appear to be doing too poorly at all at this time. Nonetheless he has been on doxycycline that is not going to be [name] good option for him based on the culture report that I have for review today which graded as [name] partial report with still several organisms pending as far as identification is concerned we did contact them but again they do not have the final report yet. Either way based on what we see it appears that doxycycline is one of the few medications that will not work for the Citrobacter. Obviously I think that [name] different medication would be [name] good option for him since there is [name] gram-negative organism pending I am likely can I suggest Cipro as the best of backup antibiotic to switch him to at this point. All this was discussed with the patient today. 12/20; not much change in the substantial wound on the left posterior calf and the small area on the right medial. He has [name] new area on the left anterior which looks like [name] wrap injury. Had some thoughts about doing [name] deep tissue culture here although we will put that off for next time. Using silver alginate as [name] primary dressing 2/10; substantial area on the left posterior calf. This measures smaller but still is [name]  very large area. He has the area on the right anterior medial which also is [name] fairly large wound but much smaller than not on the left. His edema control is quite good. We have been using silver alginate because of the size of the wounds. 4/7; substantial wound  on the left posterior calf and [name] smaller area on the right medial lower leg. These are very chronic wounds which we have classified is venous. Previously worked up at [facility] for 5 years ago at which time I had sent and will send him over for consideration of extensive skin graft I know they biopsied this many times but he never had plastic surgery. The wound area is much too large for standard size dressings we have been using silver alginate but we did give him [name] good trial last fall of Hydrofera Blue that did not make any difference either. If anything the area on the right medial lower leg over time has gotten [name] lot smaller as has the area on the left although it still quite substantial now. Readmission 10/08/2021 Mr. Samuell Knoble is [name] 66 year old male with [name] past medical history of insulin-dependent type 2 diabetes with last hemoglobin A1c of 10.2, chronic venous insufficiency, lymphedema and chronic diastolic heart failure that presents to the clinic for bilateral lower extremity wounds. He has been followed in our clinic previously for the past 6 years for these issues. He was last seen in April 2022. He is inconsistent with his appointments in our clinic. These wounds have not healed over the past few years. He is currently using silver alginate to the wound beds and keeping these covered with Kerlix. He currently denies systemic signs of infection. He was recently hospitalized for septic shock secondary to bacteremia from likely chronic leg wounds And is currently on oral antibiotics to be completed this week. 1/30; patient presents for follow-up. He has no issues or complaints today. 2/6; patient presents for follow-up. He reports more  discomfort with the increase in compression wraps. He currently denies systemic signs of infection and feels well overall. 2/13; patient presents for follow-up. He reports tolerating the current leg/Coban wrap well. He denies systemic signs of infection and feels fine overall. 2/21; patient presents for follow-up. He has no issues or complaints today. He tolerated the kirlex/Coban wrap. He denies systemic signs of infection. 3/21; patient presents for follow-up. He has no issues or complaints today. He saw infectious disease, Dr. Megan Salon. Nothing further to do from an infectious disease point. Levaquin was stopped. He currently denies systemic signs of infection. 4/4; patient presents for follow-up. His facility was able to obtain the [facility] antibiotic and has started using this with dressing changes/wrap changes. Patient has no issues or complaints today. He denies signs of infection. 4/18; patient presents for follow-up. He has no issues or complaints today. He denies signs of infection. 5/9; patient presents for follow-up. He has been using [facility] antibiotic under Kerlix/Coban. He denies signs of infection today. 6/6; monthly follow-up. Patient is at [facility] skilled facility. He has been using [facility] probably silver alginate under kerlix Coban. We use Sorbact here. Wound condition has improved 7/11; patient presents for follow-up. We have been using [facility] with Sorbact under Kerlix/Coban. Patient has no issues or complaints today. 8/8; patient presents for follow-up. He is still using [facility] and Sorbact under Kerlix/Coban to his lower extremities bilaterally. He has no issues or complaints today. He denies signs of infection. 9/12; patient presents for follow-up. We have been using [facility] with Sorbact and calcium alginate under Kerlix/Coban to lower extremities bilaterally. He has no issues or complaints today. 10/10; patient presents for follow-up. We continue to use [facility]  with Sorbact and calcium alginate under Kerlix/Coban. He has tolerated this well and he has no issues or complaints today.  11/14; patient presents for follow-up. We have been using [facility] with Sorbact and calcium alginate under Kerlix/Coban to the lower extremities bilaterally. He cannot tolerate higher compression. He states that the facility he resides and changes his dressings 3 times [name] week. He has no issues or complaints today. 12/12; patient presents for follow-up. We have been using [facility] antibiotic ointment with Sorbact and calcium alginate under Kerlix/Coban to the lower extremities bilaterally. He is open to trying [name] higher level compression. His ABIs do not suggest significant arterial insufficiency. Electronic Signature(s) Signed: 09/03/2022 4:30:17 PM By: [name] Shan DO Entered By: [name] on 09/03/2022 14:00:32 -------------------------------------------------------------------------------- Physical Exam Details Patient Name: Date of Service: [name], [name] 09/03/2022 12:30 PM Medical Record Number: [id number] Patient Account Number: [id number] [name], [name] ([id number]) [id number] Page 4 of 14 Date of Birth/Sex: Treating RN: 28-Jul-1956 (66 y.o. M) Primary Care Provider: Other Clinician: Seward [name] Referring Provider: Treating Provider/Extender: [name] in Treatment: 70 Constitutional respirations regular, non-labored and within target range for patient.. Cardiovascular 2+ dorsalis pedis/posterior tibialis pulses. Psychiatric pleasant and cooperative. Notes Bilateral lower extremity wounds with granulation tissue and nonviable tissue, left > right. No signs of surrounding infection. Evidence of lymphedema skin changes bilaterally to the lower extremities. Electronic Signature(s) Signed: 09/03/2022 4:30:17 PM By: [name] Shan DO Entered By: [name] on 09/03/2022  14:01:03 -------------------------------------------------------------------------------- Physician Orders Details Patient Name: Date of Service: [name], [name] 09/03/2022 12:30 PM Medical Record Number: [id number] Patient Account Number: [id number] Date of Birth/Sex: Treating RN: 1955-10-12 (65 y.o. [name] Primary Care Provider: Seward [name] Other Clinician: Referring Provider: Treating Provider/Extender: [name] in Treatment: 51 Verbal / Phone Orders: No Diagnosis Coding ICD-10 Coding Code Description [phone] Chronic venous hypertension (idiopathic) with ulcer and inflammation of bilateral lower extremity 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ed L97.912 Non-pressure chronic ulcer of unspecified part of right lower leg with fat layer exposed I89.0 Lymphedema, not elsewhere classified E44.0 Moderate protein-calorie malnutrition E11.622 Type 2 diabetes mellitus with other skin ulcer A41.66 Chronic diastolic (congestive) heart failure Follow-up Appointments Return appointment in 1 month. - 4 weeks Dr. Heber Geiger NEED STRETCHER ROOM ****EXTRA TIME 78 MINS. HOYEr/HIGH ACUITY*** No appt.'s after 11:00 and after 3:00!!**** ****PLEASE WRAP LEGS BEGINNING AT TOES AND GOING TO BEND OF KNEE*** Other: - Pt. to bring in [place] to every [facility] appt. please!!:) PLEASE CALL WOUND CENTER IF UNABLE TO TOLERATE 3 LAYER COMPRESSION WRAPS. Anesthetic (In clinic) Topical Lidocaine 4% applied to wound bed Bathing/ Shower/ Hygiene May shower and wash wound with soap and water. - when changing dressing Edema Control - Lymphedema / SCD / Other Elevate legs to the level of the heart or above for 30 minutes daily and/or when sitting, [name] frequency of: - throughout the day Moisturize legs daily. - with dressing changes Additional Orders / Instructions Follow Nutritious Diet - Monitor/Control Blood Sugars-Continue using  ProStat Other: - Go to emergency room for fever, chills, strong odor from wounds, increased pain in wounds [name], [name] ([id number]) [id number] Page 5 of 14 Wound Treatment Wound #25 - Lower Leg Wound Laterality: Right, Medial Cleanser: Soap and Water 3 x Per Week/30 Days Discharge Instructions: May shower and wash wound with dial antibacterial soap and water prior to dressing change. Cleanser: Wound Cleanser 3 x Per Week/30 Days Discharge Instructions: Cleanse the wound with wound cleanser prior to applying [name] clean dressing using gauze sponges, not tissue or cotton  balls. Peri-Wound Care: Sween Lotion (Moisturizing lotion) 3 x Per Week/30 Days Discharge Instructions: Apply moisturizing lotion as directed Prim Dressing: Cutimed Sorbact Swab 3 x Per Week/30 Days ary Discharge Instructions: Apply to wound bed as instructed Prim Dressing: Maxorb Extra Calcium Alginate Dressing, 4x4 in 3 x Per Week/30 Days ary Discharge Instructions: Apply calcium alginate over the cutimed to help absorb drainage. Prim Dressing: Keystone Antibiotic spray ary 3 x Per Week/30 Days Secondary Dressing: ABD Pad, 8x10 3 x Per Week/30 Days Discharge Instructions: Apply over primary dressing as directed. Compression Wrap: ThreePress (3 layer compression wrap) 3 x Per Week/30 Days Discharge Instructions: Apply three layer compression as directed. Wound #27 - Lower Leg Wound Laterality: Left, Posterior Cleanser: Soap and Water 3 x Per Week/30 Days Discharge Instructions: May shower and wash wound with dial antibacterial soap and water prior to dressing change. Cleanser: Wound Cleanser 3 x Per Week/30 Days Discharge Instructions: Cleanse the wound with wound cleanser prior to applying [name] clean dressing using gauze sponges, not tissue or cotton balls. Peri-Wound Care: Sween Lotion (Moisturizing lotion) 3 x Per Week/30 Days Discharge Instructions: Apply moisturizing lotion as directed Prim  Dressing: Cutimed Sorbact Swab 3 x Per Week/30 Days ary Discharge Instructions: Apply to wound bed as instructed Prim Dressing: Maxorb Extra Calcium Alginate Dressing, 4x4 in 3 x Per Week/30 Days ary Discharge Instructions: Apply calcium alginate over the cutimed to help absorb drainage. Prim Dressing: Keystone Antibiotic spray ary 3 x Per Week/30 Days Secondary Dressing: ABD Pad, 8x10 3 x Per Week/30 Days Discharge Instructions: Apply over primary dressing as directed. Compression Wrap: ThreePress (3 layer compression wrap) 3 x Per Week/30 Days Discharge Instructions: Apply three layer compression as directed. Wound #29 - Lower Leg Wound Laterality: Right, Lateral Cleanser: Soap and Water 3 x Per Week/30 Days Discharge Instructions: May shower and wash wound with dial antibacterial soap and water prior to dressing change. Cleanser: Wound Cleanser 3 x Per Week/30 Days Discharge Instructions: Cleanse the wound with wound cleanser prior to applying [name] clean dressing using gauze sponges, not tissue or cotton balls. Peri-Wound Care: Sween Lotion (Moisturizing lotion) 3 x Per Week/30 Days Discharge Instructions: Apply moisturizing lotion as directed Prim Dressing: Cutimed Sorbact Swab 3 x Per Week/30 Days ary Discharge Instructions: Apply to wound bed as instructed Prim Dressing: Maxorb Extra Calcium Alginate Dressing, 4x4 in 3 x Per Week/30 Days ary Discharge Instructions: Apply calcium alginate over the cutimed to help absorb drainage. Prim Dressing: Keystone Antibiotic spray ary 3 x Per Week/30 Days Secondary Dressing: ABD Pad, 8x10 3 x Per Week/30 Days Discharge Instructions: Apply over primary dressing as directed. Compression Wrap: ThreePress (3 layer compression wrap) 3 x Per Week/30 Days Discharge Instructions: Apply three layer compression as directed. Electronic Signature(s) [name], [name] ([id number]) [phone].pdf Page 6 of 14 Signed: 09/03/2022 4:30:17  PM By: [name] Shan DO Entered By: [name] on 09/03/2022 14:01:30 -------------------------------------------------------------------------------- Problem List Details Patient Name: Date of Service: [name], [name] 09/03/2022 12:30 PM Medical Record Number: [id number] Patient Account Number: [id number] Date of Birth/Sex: Treating RN: 01-17-1956 (66 y.o. [name] Primary Care Provider: Seward [name] Other Clinician: Referring Provider: Treating Provider/Extender: [name] in Treatment: 33 Active Problems ICD-10 Encounter Code Description Active Date MDM Diagnosis I87.333 Chronic venous hypertension (idiopathic) with ulcer and inflammation of 10/08/2021 No Yes bilateral lower extremity 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ed1/16/2023 No Yes L97.912 Non-pressure chronic ulcer of unspecified  part of right lower leg with fat layer 10/08/2021 No Yes exposed I89.0 Lymphedema, not elsewhere classified 10/08/2021 No Yes E44.0 Moderate protein-calorie malnutrition 06/04/2022 No Yes E11.622 Type 2 diabetes mellitus with other skin ulcer 06/04/2022 No Yes W09.81 Chronic diastolic (congestive) heart failure 06/04/2022 No Yes Inactive Problems Resolved Problems Electronic Signature(s) Signed: 09/03/2022 4:30:17 PM By: [name] Shan DO Previous Signature: 09/03/2022 12:55:16 PM Version By: [name] Shan DO Entered By: [name] on 09/03/2022 13:59:28 Progress Note Details -------------------------------------------------------------------------------- [name], [name] ([id number]) [id number] Page 7 of 14 Patient Name: Date of Service: [name], [name] 09/03/2022 12:30 PM Medical Record Number: [id number] Patient Account Number: [id number] Date of Birth/Sex: Treating RN: July 23, 1956 (66 y.o. M) Primary Care Provider: Seward [name] Other Clinician: Referring  Provider: Treating Provider/Extender: [name] in Treatment: 73 Subjective Chief Complaint Information obtained from Patient 10/08/2021; bilateral lower extremity wounds History of Present Illness (HPI) 07/07/15; this is [name] patient who is in hospital on 8/2 through 8/4. He had cellulitis and abscess of predominantly I think the left leg. He received IV antibiotics. Plain x-ray showed no osteomyelitis. An MRI of the left leg did not show osteomyelitis. Cultures showed no predominant organism. His hemoglobin A1c was 9.8. He has [name] history of venous stasis also peripheral vascular disease. He was discharged to [facility]. He really has extensive ulcerations on the left lateral leg including [name] major wound that communicates both posteriorly and superiorly w. When drainage coming out of 2 smaller areas. He has [name] smaller wound on the posterior medial left leg. He has more predominantly venous insufficiency wounds on predominantly the right medial leg above the ankle the right foot into sections. He has recently been put on Augmentin at the nursing home. Previous ABIs/arterial evaluation showed triphasic waves diffusely he does not have [name] major ischemic issue [name] left lower extremity venous duplex also exam showed no evidence of [name] DVT on 6/21 07/21/15; the patient arrives with really no major change. Culture I did last time was negative. He has not had [name] follow-up MRI I ordered. The wounds are macerated covered with [name] thick gelatinous surface slough. There is necrotic subcutaneous tissue 08/04/15; the patient arrives with [name] considerable improvement in the majority of his wound area. The area on the left leg now has what looks to be [name] granulated base. Most of the wounds on the left leg required an aggressive surgical debridement to remove nonviable fibrinous eschar and subcutaneous tissue however after debridement most of this looks better. Although I had asked for  repeat MRI of the left leg when he first came in here with grossly purulent material coming out of his wounds, it does not appear that this is been done and nor do I actually feel that strongly about it right now. He has severe surrounding venous insufficiency and inflammation. I don't believe he has significant PAD 08/25/15. In general there is still [name] considerable wound area here but with still extensive service adherent slough. The patient will not allow mechanical debridement due to pain. He did not tolerate Medihoney therefore we are left with Santyl for now. She would appear that he has severe surrounding venous stasis. There is no evidence of the infection that may have had something to do with the pathogenesis of these wounds 09/29/15; patient still has substantial wound area on the left leg with [name] cluster of several wounds on the lateral leg confluently on the left leg posteriorly and then [name] substantial wound on  the medial leg. On the right leg [name] substantial wound medially and [name] small area on the right lateral foot. All of these underwent [name] substantial surgical debridement with curettes which she tolerated better than he has in the past. This started as [name] complex cellulitis in the face of chronic venous insufficiency and inflammation. 10/13/15; substantial cluster of wounds on the lateral aspect of his left leg confluently around to the other side. Extensive surgical debridement to remove for redness surface slough nonviable subcutaneous tissue. This is not an improvement. Also substantial wound on the medial right leg which is largely unchanged. The etiology of this was felt to be [name] complex cellulitis in the summer of 2016 in the face of chronic venous insufficiency and inflammation. The patient states that the wound on the right medial leg has been there for years off and on. 10/20/15; we are able to start [facility] to these extensive wound areas left greater than right on Tuesday after I  spoke to the wound care nurse at the facility. He arrives here today for extensive surgical debridement for the wounds on the lateral aspect of his left leg posterior left leg, this is almost circumferential. He has [name] large wound on the medial aspect of his right leg although he finds this too painful for debridement 10/27/15; I continue to bring this patient back for frequent debridement/weekly debridement severe. We have been using Hydrofera Blue. Unfortunately this has not really had any improvement. The debridement surgery difficult and painful for the patient 11/23/15; this patient spent [name] complex hospitalization admitted with acute kidney injury, anemia, cellulitis of the lower extremity, he was felt to have sepsis pathophysiology although his blood cultures were negative. He had plain x-rays of both legs that were negative for osseous abnormalities. He was seen by infectious disease and placed on [name] workup for vasculitis that was negative including [name] biopsy o4 all were consistent with stasis dermatitis. Vital broad-spectrum antibiotics he continued to spike fevers. Urine and chest x-ray were negative Dopplers on admission ruled out [name] DVT All antibiotics were stopped on 2/17. . Since his return to [facility] skilled nursing facility I believe they have been using Xeroform 12/07/15 the patient arrives today with the area on his right medial leg actually looking quite stable. No debridement. The rest of his extensive wounds on the left lateral left posterior extending into the left medial leg all required extensive debridement. I think this is probably going to need to and up in the hands of plastic surgery. We'll attempt to change him back to [facility]. Arrange consultation with plastic surgery at [facility] for this almost circumferential wound on the left side 12/21/15 right leg covered in surface slough. This was debridement. Left leg extensive wounds all carefully examined. This is almost  circumferential on the left side especially on the posterior calf. No debridement is necessary. 01/04/16 the patient has been to [facility] plastic surgery. Unfortunately it doesn't look like they had any of the prior workup on this patient. They're in the middle of vascular workup. It sounds as though they're applying Hydrofera Blue at the nursing home. 01/22/16; the patient has been back to see [facility]. There apparently making plans to possibly do skin grafts over his large venous insufficiency ulcerations. The patient tells me that he goes to hematology in [facility] and variably uses the term platelets red cells and the description of his problem. He is also [name] [employer] account [employer] and will not allow transfusion of blood  products. I do not see any major difference in the wound on the right medial leg and circumferentially across the left medial to left lateral leg. Did not attempt to debride these today. Readmission: 05/05/18 on evaluation today patient presents for reevaluation here in our clinic although I have previously seen him in the skilled nursing facility over the past year and [name] half when I was working in the skilled nursing facility realm instead of covering in the clinic. With that being said during that time we had initiated most recently dressings with Hydrofera Blue Dressing along with the [employer] which had done very well for him. Much better than the Kerlex Coban wraps. With that being said the patient had been doing fairly well and was making progress when I last saw him. Upon evaluation today it appears that the wounds are actually [name] little bit worse than when I last saw him although definitely not dramatically so. There does not appear to be any evidence of infection which is good news. With that being said he has been tolerating the wraps without complication his left hip still gives him [name] lot of trouble which is his main issue as far as movement is concerned. Unbeknownst  to me he has not had venous studies that I can find. He previously told me he had there were arterial studies noted back from 04/27/15 which showed that he had try phasic blood flow throughout and again his test appear to be completely normal as performed by Dr. Creig Hines. With that being said I cannot find where he had venous studies. The patient still states he thinks he did these definitely had no venous intervention at this point. Upon evaluation today it does not appear to me that the patient has any evidence of cellulitis. He does have some chronic venous insufficiency which I think has led to stasis dermatitis but again this is not appearing to be infected at this point. No fevers, chills, nausea, or vomiting noted at this time. READMISSION 06/30/2018 This is [name] now 66 year old man we have had in this clinic at multiple times in the past. Most recently he came in August and was seen by [facility] stone. It is not clear why he did not come back we asked him really did not get [name] straight answer. He is at [facility] skilled facility he is [name] man who has severe chronic venous insufficiency with lymphedema and has had severe wounds on his left greater than right leg. We had him here in 2016 and 17 ultimately referred him to [facility]. He had arterial studies and venous studies done at [facility] although I am not able to access these records I see they are actually done. He also had multiple biopsies that were negative for malignancy showing changes compatible with chronic inflammation. As I understand things in [place] they are using Iodoflex and Unna boots. I am not really sure how they are getting enough Iodoflex for the large wound area especially on his left calf. Nevertheless the wounds look better than when I saw this in 2017. There were plans for him to have plastic surgery in 2018 and I think he was actually prepped for surgery however he was ultimately denied because the patient was an  active smoker. The patient is not systemically unwell. The wounds are painful however given the size especially on the left this is not surprising. [name], [name] ([id number]) [id number] Page 8 of 14 ABIs in this clinic were 0.78 on  the left and 0.91 on the right 07/13/2018; this is [name] patient with bilateral severe venous ulcers with secondary lymphedema. The wounds are on the right medial calf and [name] substantial part of the left posterior calf and some involvement medially and laterally on the left. We changed him to silver alginate under Unna boot's last time. The wounds actually look fairly satisfactory today. 08/14/2018; this is [name] patient with severe bilateral venous stasis ulcers with secondary lymphedema left greater than right he is at [facility] using silver alginate under Unna boot's I do not think there is much change on this visit versus last time I saw him [name] month ago Readmission: 11/04/18 patient presents today for follow-up concerning his bilateral lower extremity ulcers. His [name] right medial ankle ulcer and [name] left leg that has ulcers over [name] large proportion of the surface area between the ankle and knee. Unfortunately this does cause some discomfort for the patient although it doesn't seem to be as uncomfortable as it has been in the past. He is seen today for evaluation after [name] referral by [place] back to the clinic. Unfortunately has [name] lot of Slough noted on the surface of his wounds. He's been treated currently it sounds like with Dakin's soaked gauze and they have not been performing the Unna Boot wrap that was previously recommended. I'm not exactly sure what the reason for the change was. He does have less slough on the surface of the wound but again this is something that is often [name] constant issue for him. Again he's had these wounds for many years. I've known him for two years at least during that time when I was taking care of them in the facility he is  Artie had the wounds for several years prior. READMISSION 02/25/2020 [name] is [name] now 66 year old man. He has been in our clinic several times before most extensively in October 2016 through May 2017. At that time he had absolutely substantial bilateral lower extremity wounds secondary to chronic venous insufficiency and lymphedema. We ultimately referred him to [facility] he had [name] series of biopsies only showed suggestion of wound secondary to chronic venous insufficiency. He had [name] less extensive stay in our clinic in 2019 and then [name] single visit in February 2020. He is [name] resident at [facility] skilled facility. I think he has had intermittently had in facility wound care. He returns today. They are using silver alginate on the wounds although I am not sure what type of compression. Previously he has favored Unna boots. He is not felt to have an arterial issue. Past medical history; lymphedema and chronic venous insufficiency, diabetes mellitus, hypertension, congestive heart failure We did not do arterial studies today 04/27/2020 on evaluation patient appears to be doing really about the same as when I have known him and seen him in the past. He does have wounds on the right and left legs at this point. Fortunately there is no signs of active infection at this time. 9/2; 1 month follow-up. This is [name] man with severe chronic venous insufficiency and lymphedema. When I first met him he had horrible circumferential bilateral lower extremity ulcers. We have been using silver alginate up until early last month when it was changed to [facility] and Unna boot's more or less says maintenance dressings. Since the last time he was here the facility where he resides [facility] called to report they could no longer do Unna boot so he is apparently only been receiving kerlix  Coban the left leg is certainly [name] lot worse with almost circumferential large wounds especially lateral but now medial and posterior as  well. He has [name] standard same looking wound on the right medial lower leg. 10/1; absolutely no improvement here. In fact I do not think anything much is changed. The substantial area on his left lateral and left posterior calf is still open may be slightly larger. He has [name] more modest wound on the right medial calf. None of these look any different. He comes in with [name] kerlix Coban wrap this will simply not be adequate. He has too much edema in this leg He also is complaining [name] lot of pain in his left heel area. 11/8; potential wounds on the left lateral and posterior calf and [name] smaller oval wound in the right medial. We have been using Hydrofera Blue under 4-layer compression. He was using Unna boots still 2 months ago the facility called to say they can no longer place these so we have been using 4-layer compression. The surface area of the area on the left is so large there is very few alternatives to what we can use on this either silver alginate or Hydrofera Blue. He was complaining of pain on the left heel last time I asked for an x-ray this was apparently done although we do not have the result. He is not complaining of pain today. 08/16/2020 on evaluation today patient is is seen for [name] early visit due to the fact that he apparently is having issues I was told when they called on Monday with [name] MRSA infection that they felt like we needed to see him for because his legs "looked horrible". With that being said based on what I see at this point it does not appear that the patient is actually having [name] terrible infection in fact compared to last time I saw him his legs do not appear to be doing too poorly at all at this time. Nonetheless he has been on doxycycline that is not going to be [name] good option for him based on the culture report that I have for review today which graded as [name] partial report with still several organisms pending as far as identification is concerned we did contact them but again  they do not have the final report yet. Either way based on what we see it appears that doxycycline is one of the few medications that will not work for the Citrobacter. Obviously I think that [name] different medication would be [name] good option for him since there is [name] gram-negative organism pending I am likely can I suggest Cipro as the best of backup antibiotic to switch him to at this point. All this was discussed with the patient today. 12/20; not much change in the substantial wound on the left posterior calf and the small area on the right medial. He has [name] new area on the left anterior which looks like [name] wrap injury. Had some thoughts about doing [name] deep tissue culture here although we will put that off for next time. Using silver alginate as [name] primary dressing 2/10; substantial area on the left posterior calf. This measures smaller but still is [name] very large area. He has the area on the right anterior medial which also is [name] fairly large wound but much smaller than not on the left. His edema control is quite good. We have been using silver alginate because of the size of the wounds. 4/7; substantial wound on the  left posterior calf and [name] smaller area on the right medial lower leg. These are very chronic wounds which we have classified is venous. Previously worked up at [facility] for 5 years ago at which time I had sent and will send him over for consideration of extensive skin graft I know they biopsied this many times but he never had plastic surgery. The wound area is much too large for standard size dressings we have been using silver alginate but we did give him [name] good trial last fall of Hydrofera Blue that did not make any difference either. If anything the area on the right medial lower leg over time has gotten [name] lot smaller as has the area on the left although it still quite substantial now. Readmission 10/08/2021 Mr. Lateef Juncaj is [name] 66 year old male with [name] past medical history of insulin-dependent  type 2 diabetes with last hemoglobin A1c of 10.2, chronic venous insufficiency, lymphedema and chronic diastolic heart failure that presents to the clinic for bilateral lower extremity wounds. He has been followed in our clinic previously for the past 6 years for these issues. He was last seen in April 2022. He is inconsistent with his appointments in our clinic. These wounds have not healed over the past few years. He is currently using silver alginate to the wound beds and keeping these covered with Kerlix. He currently denies systemic signs of infection. He was recently hospitalized for septic shock secondary to bacteremia from likely chronic leg wounds And is currently on oral antibiotics to be completed this week. 1/30; patient presents for follow-up. He has no issues or complaints today. 2/6; patient presents for follow-up. He reports more discomfort with the increase in compression wraps. He currently denies systemic signs of infection and feels well overall. 2/13; patient presents for follow-up. He reports tolerating the current leg/Coban wrap well. He denies systemic signs of infection and feels fine overall. 2/21; patient presents for follow-up. He has no issues or complaints today. He tolerated the kirlex/Coban wrap. He denies systemic signs of infection. 3/21; patient presents for follow-up. He has no issues or complaints today. He saw infectious disease, Dr. Megan Salon. Nothing further to do from an infectious disease point. Levaquin was stopped. He currently denies systemic signs of infection. [facility], [name] ([id number]) [id number] Page 9 of 14 4/4; patient presents for follow-up. His facility was able to obtain the [facility] antibiotic and has started using this with dressing changes/wrap changes. Patient has no issues or complaints today. He denies signs of infection. 4/18; patient presents for follow-up. He has no issues or complaints today. He denies signs of  infection. 5/9; patient presents for follow-up. He has been using Keystone antibiotic under Kerlix/Coban. He denies signs of infection today. 6/6; monthly follow-up. Patient is at [facility] skilled facility. He has been using Keystone probably silver alginate under kerlix Coban. We use Sorbact here. Wound condition has improved 7/11; patient presents for follow-up. We have been using Keystone with Sorbact under Kerlix/Coban. Patient has no issues or complaints today. 8/8; patient presents for follow-up. He is still using Keystone and Sorbact under Kerlix/Coban to his lower extremities bilaterally. He has no issues or complaints today. He denies signs of infection. 9/12; patient presents for follow-up. We have been using Keystone with Sorbact and calcium alginate under Kerlix/Coban to lower extremities bilaterally. He has no issues or complaints today. 10/10; patient presents for follow-up. We continue to use [facility] with Sorbact and calcium alginate under Kerlix/Coban. He has tolerated this well and he  has no issues or complaints today. 11/14; patient presents for follow-up. We have been using Keystone with Sorbact and calcium alginate under Kerlix/Coban to the lower extremities bilaterally. He cannot tolerate higher compression. He states that the facility he resides and changes his dressings 3 times [name] week. He has no issues or complaints today. 12/12; patient presents for follow-up. We have been using Keystone antibiotic ointment with Sorbact and calcium alginate under Kerlix/Coban to the lower extremities bilaterally. He is open to trying [name] higher level compression. His ABIs do not suggest significant arterial insufficiency. Patient History Information obtained from Patient. Family History Diabetes - Mother,Father,Siblings, Hypertension - Mother,Father, No family history of Cancer, Heart Disease, Hereditary Spherocytosis, Kidney Disease, Lung Disease, Seizures, Stroke, Thyroid Problems,  Tuberculosis. Social History Current every day smoker - 4 cig per day, Marital Status - Separated, Alcohol Use - Never, Drug Use - Prior History - cocaine, [place] quit 2005, Caffeine Use - Moderate. Medical History Eyes Denies history of Cataracts, Glaucoma, Optic Neuritis Ear/Nose/Mouth/Throat Denies history of Chronic sinus problems/congestion, Middle ear problems Hematologic/Lymphatic Patient has history of Anemia - iron deficiency Denies history of Hemophilia, Human Immunodeficiency Virus, Lymphedema, Sickle Cell Disease Respiratory Patient has history of Sleep Apnea Denies history of Aspiration, Asthma, Chronic Obstructive Pulmonary Disease (COPD), Pneumothorax, Tuberculosis Cardiovascular Patient has history of Congestive Heart Failure, Coronary Artery Disease, Hypertension, Peripheral Venous Disease Denies history of Angina, Arrhythmia, Deep Vein Thrombosis, Hypotension, Myocardial Infarction, Peripheral Arterial Disease, Phlebitis, Vasculitis Gastrointestinal Denies history of Cirrhosis , Colitis, Crohnoos, Hepatitis [name], Hepatitis B, Hepatitis C Endocrine Patient has history of Type II Diabetes - uncontrolled Denies history of Type I Diabetes Genitourinary Denies history of End Stage Renal Disease Immunological Denies history of Lupus Erythematosus, Raynaudoos, Scleroderma Integumentary (Skin) Denies history of History of Burn Musculoskeletal Patient has history of Osteoarthritis Denies history of Gout, Rheumatoid Arthritis, Osteomyelitis Neurologic Patient has history of Neuropathy Denies history of Dementia, Quadriplegia, Paraplegia, Seizure Disorder Oncologic Denies history of Received Chemotherapy, Received Radiation Psychiatric Denies history of Anorexia/bulimia, Confinement Anxiety Hospitalization/Surgery History - inguinal hernia repair with mesh. - right shoulder rotator cuff repair. - toe nail excision. - Sepsis 09/03/21-09/10/22. Medical [name] Surgical History  Notes nd Gastrointestinal GERD Musculoskeletal degenerative righ thip Psychiatric depression [name], [name] ([id number]) [id number] Page 10 of 14 Objective Constitutional respirations regular, non-labored and within target range for patient.. Vitals Time Taken: 12:45 PM, Height: 77 in, Weight: 258 lbs, BMI: 30.6, Temperature: 98.6 F, Pulse: 85 bpm, Respiratory Rate: 18 breaths/min, Blood Pressure: 125/79 mmHg. Cardiovascular 2+ dorsalis pedis/posterior tibialis pulses. Psychiatric pleasant and cooperative. General Notes: Bilateral lower extremity wounds with granulation tissue and nonviable tissue, left > right. No signs of surrounding infection. Evidence of lymphedema skin changes bilaterally to the lower extremities. Integumentary (Hair, Skin) Wound #25 status is Open. Original cause of wound was Gradually Appeared. The date acquired was: 09/07/2021. The wound has been in treatment 47 weeks. The wound is located on the Right,Medial Lower Leg. The wound measures 5.7cm length x 2.5cm width x 0.2cm depth; 11.192cm^2 area and 2.238cm^3 volume. There is Fat Layer (Subcutaneous Tissue) exposed. There is no tunneling or undermining noted. There is [name] medium amount of serosanguineous drainage noted. The wound margin is thickened. There is large (67-100%) red, pink granulation within the wound bed. There is [name] small (1-33%) amount of necrotic tissue within the wound bed including Adherent Slough. The periwound skin appearance did not exhibit: Callus, Crepitus, Excoriation, Induration, Rash, Scarring, Dry/Scaly, Maceration, Atrophie Blanche, Cyanosis, Ecchymosis, Hemosiderin Staining, Mottled,  Pallor, Rubor, Erythema. Wound #25 status is Open. Original cause of wound was Gradually Appeared. The date acquired was: 09/07/2021. The wound has been in treatment 47 weeks. The wound is located on the Right,Medial Lower Leg. The wound measures 5.7cm length x 2.5cm width x 0.2cm  depth; 11.192cm^2 area and 2.238cm^3 volume. There is Fat Layer (Subcutaneous Tissue) exposed. There is no tunneling or undermining noted. There is [name] medium amount of serosanguineous drainage noted. The wound margin is thickened. There is large (67-100%) red, pink granulation within the wound bed. There is [name] small (1-33%) amount of necrotic tissue within the wound bed including Adherent Slough. The periwound skin appearance did not exhibit: Callus, Crepitus, Excoriation, Induration, Rash, Scarring, Dry/Scaly, Maceration, Atrophie Blanche, Cyanosis, Ecchymosis, Hemosiderin Staining, Mottled, Pallor, Rubor, Erythema. Wound #25 status is Open. Original cause of wound was Gradually Appeared. The date acquired was: 09/07/2021. The wound has been in treatment 47 weeks. The wound is located on the Right,Medial Lower Leg. The wound measures 5.7cm length x 2.5cm width x 0.2cm depth; 11.192cm^2 area and 2.238cm^3 volume. There is Fat Layer (Subcutaneous Tissue) exposed. There is no tunneling or undermining noted. There is [name] medium amount of serosanguineous drainage noted. The wound margin is thickened. There is large (67-100%) red, pink granulation within the wound bed. There is [name] small (1-33%) amount of necrotic tissue within the wound bed including Adherent Slough. The periwound skin appearance did not exhibit: Callus, Crepitus, Excoriation, Induration, Rash, Scarring, Dry/Scaly, Maceration, Atrophie Blanche, Cyanosis, Ecchymosis, Hemosiderin Staining, Mottled, Pallor, Rubor, Erythema. Wound #27 status is Open. Original cause of wound was Gradually Appeared. The date acquired was: 09/07/2021. The wound has been in treatment 47 weeks. The wound is located on the Left,Posterior Lower Leg. The wound measures 13.5cm length x 16cm width x 0.4cm depth; 169.646cm^2 area and 67.858cm^3 volume. There is Fat Layer (Subcutaneous Tissue) exposed. There is no tunneling or undermining noted. There is [name] medium amount of  serosanguineous drainage noted. The wound margin is distinct with the outline attached to the wound base. There is medium (34-66%) red, friable granulation within the wound bed. There is [name] medium (34-66%) amount of necrotic tissue within the wound bed including Eschar and Adherent Slough. The periwound skin appearance did not exhibit: Callus, Crepitus, Excoriation, Induration, Rash, Scarring, Dry/Scaly, Maceration, Atrophie Blanche, Cyanosis, Ecchymosis, Hemosiderin Staining, Mottled, Pallor, Rubor, Erythema. Periwound temperature was noted as No Abnormality. The periwound has tenderness on palpation. Wound #29 status is Open. Original cause of wound was Gradually Appeared. The date acquired was: 04/30/2022. The wound has been in treatment 18 weeks. The wound is located on the Right,Lateral Lower Leg. The wound measures 1.2cm length x 1.1cm width x 0.3cm depth; 1.037cm^2 area and 0.311cm^3 volume. There is Fat Layer (Subcutaneous Tissue) exposed. There is no tunneling or undermining noted. There is [name] medium amount of serosanguineous drainage noted. The wound margin is distinct with the outline attached to the wound base. There is medium (34-66%) red, pink granulation within the wound bed. There is [name] medium (34-66%) amount of necrotic tissue within the wound bed including Adherent Slough. The periwound skin appearance did not exhibit: Callus, Crepitus, Excoriation, Induration, Rash, Scarring, Dry/Scaly, Maceration, Atrophie Blanche, Cyanosis, Ecchymosis, Hemosiderin Staining, Mottled, Pallor, Rubor, Erythema. Assessment Active Problems ICD-10 Chronic venous hypertension (idiopathic) with ulcer and inflammation of bilateral lower extremity Non-pressure chronic ulcer of other part of left lower leg with fat layer exposed Non-pressure chronic ulcer of unspecified part of right lower leg with fat layer exposed  Lymphedema, not elsewhere classified Moderate protein-calorie malnutrition Type 2 diabetes  mellitus with other skin ulcer Chronic diastolic (congestive) heart failure Patient's wounds are stable. His ABIs did not show significant arterial disease. I recommend trying [name] higher level compression to see if this will help facilitate wound healing. He is agreeable to this. I recommended he call Our office if he cannot tolerate the wraps. I recommended continuing the course with [facility] antibiotic ointment, calcium alginate and Sorbact. Procedures [name], [name] ([id number]) [id number] Page 11 of 14 Wound #25 Pre-procedure diagnosis of Wound #25 is [name] Venous Leg Ulcer located on the Right,Medial Lower Leg . There was [name] Three Layer Compression Therapy Procedure by [name]. Post procedure Diagnosis Wound #25: Same as Pre-Procedure Wound #27 Pre-procedure diagnosis of Wound #27 is [name] Venous Leg Ulcer located on the Left,Posterior Lower Leg . There was [name] Three Layer Compression Therapy Procedure by [name]. Post procedure Diagnosis Wound #27: Same as Pre-Procedure Wound #29 Pre-procedure diagnosis of Wound #29 is [name] Venous Leg Ulcer located on the Right,Lateral Lower Leg . There was [name] Three Layer Compression Therapy Procedure by [name]. Post procedure Diagnosis Wound #29: Same as Pre-Procedure Plan Follow-up Appointments: Return appointment in 1 month. - 4 weeks Dr. Heber [place] NEED STRETCHER ROOM ****EXTRA TIME 27 MINS. HOYEr/HIGH ACUITY*** No appt.'s after 11:00 and after 3:00!!**** ****PLEASE WRAP LEGS BEGINNING AT TOES AND GOING TO BEND OF KNEE*** Other: - Pt. to bring in [place] to every [facility] appt. please!!:) PLEASE CALL WOUND CENTER IF UNABLE TO TOLERATE 3 LAYER COMPRESSION WRAPS. Anesthetic: (In clinic) Topical Lidocaine 4% applied to wound bed Bathing/ Shower/ Hygiene: May shower and wash wound with soap and water. - when changing dressing Edema Control - Lymphedema / SCD / Other: Elevate legs to the level of the heart or above for 30  minutes daily and/or when sitting, [name] frequency of: - throughout the day Moisturize legs daily. - with dressing changes Additional Orders / Instructions: Follow Nutritious Diet - Monitor/Control Blood Sugars-Continue using ProStat Other: - Go to emergency room for fever, chills, strong odor from wounds, increased pain in wounds WOUND #25: - Lower Leg Wound Laterality: Right, Medial Cleanser: Soap and Water 3 x Per Week/30 Days Discharge Instructions: May shower and wash wound with dial antibacterial soap and water prior to dressing change. Cleanser: Wound Cleanser 3 x Per Week/30 Days Discharge Instructions: Cleanse the wound with wound cleanser prior to applying [name] clean dressing using gauze sponges, not tissue or cotton balls. Peri-Wound Care: Sween Lotion (Moisturizing lotion) 3 x Per Week/30 Days Discharge Instructions: Apply moisturizing lotion as directed Prim Dressing: Cutimed Sorbact Swab 3 x Per Week/30 Days ary Discharge Instructions: Apply to wound bed as instructed Prim Dressing: Maxorb Extra Calcium Alginate Dressing, 4x4 in 3 x Per Week/30 Days ary Discharge Instructions: Apply calcium alginate over the cutimed to help absorb drainage. Prim Dressing: Keystone Antibiotic spray 3 x Per Week/30 Days ary Secondary Dressing: ABD Pad, 8x10 3 x Per Week/30 Days Discharge Instructions: Apply over primary dressing as directed. Com pression Wrap: ThreePress (3 layer compression wrap) 3 x Per Week/30 Days Discharge Instructions: Apply three layer compression as directed. WOUND #27: - Lower Leg Wound Laterality: Left, Posterior Cleanser: Soap and Water 3 x Per Week/30 Days Discharge Instructions: May shower and wash wound with dial antibacterial soap and water prior to dressing change. Cleanser: Wound Cleanser 3 x Per Week/30 Days Discharge Instructions: Cleanse the wound with wound cleanser prior to applying [name] clean dressing using  gauze sponges, not tissue or cotton balls. Peri-Wound  Care: Sween Lotion (Moisturizing lotion) 3 x Per Week/30 Days Discharge Instructions: Apply moisturizing lotion as directed Prim Dressing: Cutimed Sorbact Swab 3 x Per Week/30 Days ary Discharge Instructions: Apply to wound bed as instructed Prim Dressing: Maxorb Extra Calcium Alginate Dressing, 4x4 in 3 x Per Week/30 Days ary Discharge Instructions: Apply calcium alginate over the cutimed to help absorb drainage. Prim Dressing: Keystone Antibiotic spray 3 x Per Week/30 Days ary Secondary Dressing: ABD Pad, 8x10 3 x Per Week/30 Days Discharge Instructions: Apply over primary dressing as directed. Com pression Wrap: ThreePress (3 layer compression wrap) 3 x Per Week/30 Days Discharge Instructions: Apply three layer compression as directed. WOUND #29: - Lower Leg Wound Laterality: Right, Lateral Cleanser: Soap and Water 3 x Per Week/30 Days Discharge Instructions: May shower and wash wound with dial antibacterial soap and water prior to dressing change. Cleanser: Wound Cleanser 3 x Per Week/30 Days Discharge Instructions: Cleanse the wound with wound cleanser prior to applying [name] clean dressing using gauze sponges, not tissue or cotton balls. Peri-Wound Care: Sween Lotion (Moisturizing lotion) 3 x Per Week/30 Days Discharge Instructions: Apply moisturizing lotion as directed Prim Dressing: Cutimed Sorbact Swab 3 x Per Week/30 Days ary Discharge Instructions: Apply to wound bed as instructed Prim Dressing: Maxorb Extra Calcium Alginate Dressing, 4x4 in 3 x Per Week/30 Days ary Discharge Instructions: Apply calcium alginate over the cutimed to help absorb drainage. Prim Dressing: Keystone Antibiotic spray 3 x Per Week/30 Days ary Secondary Dressing: ABD Pad, 8x10 3 x Per Week/30 Days Discharge Instructions: Apply over primary dressing as directed. Com pression Wrap: ThreePress (3 layer compression wrap) 3 x Per Week/30 Days Discharge Instructions: Apply three layer compression as  directed. [name], [name] ([id number]) [id number] Page 12 of 14 1. Calcium alginate, Keystone antibiotic ointment, Sorbact under 3 layer compression to the lower extremities bilaterally 2. Follow-up in 1 month Electronic Signature(s) Signed: 09/03/2022 4:30:17 PM By: [name] Shan DO Entered By: [name] on 09/03/2022 14:04:42 -------------------------------------------------------------------------------- HxROS Details Patient Name: Date of Service: [name], [name] 09/03/2022 12:30 PM Medical Record Number: [id number] Patient Account Number: [id number] Date of Birth/Sex: Treating RN: 03/21/1956 (66 y.o. M) Primary Care Provider: Seward [name] Other Clinician: Referring Provider: Treating Provider/Extender: [name] in Treatment: 16 Information Obtained From Patient Eyes Medical History: Negative for: Cataracts; Glaucoma; Optic Neuritis Ear/Nose/Mouth/Throat Medical History: Negative for: Chronic sinus problems/congestion; Middle ear problems Hematologic/Lymphatic Medical History: Positive for: Anemia - iron deficiency Negative for: Hemophilia; Human Immunodeficiency Virus; Lymphedema; Sickle Cell Disease Respiratory Medical History: Positive for: Sleep Apnea Negative for: Aspiration; Asthma; Chronic Obstructive Pulmonary Disease (COPD); Pneumothorax; Tuberculosis Cardiovascular Medical History: Positive for: Congestive Heart Failure; Coronary Artery Disease; Hypertension; Peripheral Venous Disease Negative for: Angina; Arrhythmia; Deep Vein Thrombosis; Hypotension; Myocardial Infarction; Peripheral Arterial Disease; Phlebitis; Vasculitis Gastrointestinal Medical History: Negative for: Cirrhosis ; Colitis; Crohns; Hepatitis [name]; Hepatitis B; Hepatitis C Past Medical History Notes: GERD Endocrine Medical History: Positive for: Type II Diabetes - uncontrolled Negative for: Type I Diabetes Time with diabetes:  1999 Treated with: Insulin Blood sugar tested every day: Yes T ested : 3x per day Blood sugar testing results: Breakfast: 81-200; Lunch: 180-210; Dinner: 200 Genitourinary Medical History: Negative for: End Stage Renal Disease [name], [name] ([id number]) [id number] Page 13 of 14 Immunological Medical History: Negative for: Lupus Erythematosus; Raynauds; Scleroderma Integumentary (Skin) Medical History: Negative for: History of Burn Musculoskeletal Medical History: Positive for: Osteoarthritis Negative for: Gout; Rheumatoid  Arthritis; Osteomyelitis Past Medical History Notes: degenerative righ thip Neurologic Medical History: Positive for: Neuropathy Negative for: Dementia; Quadriplegia; Paraplegia; Seizure Disorder Oncologic Medical History: Negative for: Received Chemotherapy; Received Radiation Psychiatric Medical History: Negative for: Anorexia/bulimia; Confinement Anxiety Past Medical History Notes: depression Immunizations Pneumococcal Vaccine: Received Pneumococcal Vaccination: Yes Received Pneumococcal Vaccination On or After 60th Birthday: No Implantable Devices None Hospitalization / Surgery History Type of Hospitalization/Surgery inguinal hernia repair with mesh right shoulder rotator cuff repair toe nail excision Sepsis 09/03/21-09/10/22 Family and Social History Cancer: No; Diabetes: Yes - Mother,Father,Siblings; Heart Disease: No; Hereditary Spherocytosis: No; Hypertension: Yes - Mother,Father; Kidney Disease: No; Lung Disease: No; Seizures: No; Stroke: No; Thyroid Problems: No; Tuberculosis: No; Current every day smoker - 4 cig per day; Marital Status - Separated; Alcohol Use: Never; Drug Use: Prior History - cocaine, [place] quit 2005; Caffeine Use: Moderate; Financial Concerns: No; Food, Clothing or Shelter Needs: No; Support System Lacking: No; Transportation Concerns: No Electronic Signature(s) Signed: 09/03/2022 4:30:17 PM  By: [name] Shan DO Entered By: [name] on 09/03/2022 14:00:41 -------------------------------------------------------------------------------- SuperBill Details Patient Name: Date of Service: [name], [name] 09/03/2022 Medical Record Number: [id number] Patient Account Number: [id number] Date of Birth/Sex: Treating RN: [birth year] (66 y.o. 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

## 2022-09-06 DIAGNOSIS — I5042 Chronic combined systolic (congestive) and diastolic (congestive) heart failure: Secondary | ICD-10-CM | POA: Diagnosis not present

## 2022-09-09 DIAGNOSIS — Z79899 Other long term (current) drug therapy: Secondary | ICD-10-CM | POA: Diagnosis not present

## 2022-09-09 NOTE — Progress Notes (Signed)
Mosqueda, Raider ([id number]) [id number] Page 1 of 16 Visit Report for 09/03/2022 Arrival Information Details [name], [name] 09/03/2022 12:30 PM Medical Record Number: [id number] Patient Account Number: [id number] Date of Birth/Sex: Treating RN: 1956/09/20 (66 y.o. Mare Ferrari Primary Care Keana Dueitt: [name] Other Clinician: Referring Oluwademilade Kellett: Treating Laylee Schooley/Extender: [name] in Treatment: 47 Visit Information History Since Last Visit Added or deleted any medications: No Patient Arrived: Wheel Chair Any new allergies or adverse reactions: No Arrival Time: 13:07 Had a fall or experienced change in No Accompanied By: caregiver activities of daily living that may affect Transfer Assistance: None risk of falls: Patient Identification Verified: Yes Signs or symptoms of abuse/neglect since last visito No Secondary Verification Process Completed: Yes Hospitalized since last visit: No Patient Requires Transmission-Based No Implantable device outside of the clinic excluding No Precautions: cellular tissue based products placed in the center Patient Has Alerts: Yes since last visit: Patient Alerts: Patient on Blood Thinner Has Dressing in Place as Prescribed: Yes *NO BLOOD Has Compression in Place as Prescribed: Yes PRODUCTS* Pain Present Now: Yes Electronic Signature(s) Signed: 09/09/2022 3:55:49 PM By: Sharyn Creamer RN, BSN Entered By: Sharyn Creamer on 09/03/2022 13:08:01 -------------------------------------------------------------------------------- Compression Therapy Details [name], [name] 09/03/2022 12:30 PM Medical Record Number: [id number] Patient Account Number: [id number] Date of Birth/Sex: Treating RN: 11-08-1955 (66 y.o. [name] Primary Care Tarae Wooden: [name] Other Clinician: Referring Ima Hafner: Treating Judee Hennick/Extender:  [name] in Treatment: 85 Compression Therapy Performed for Wound Assessment: Wound #25 Right,Medial Lower Leg Performed By: Clinician [name], Compression Type: Three Layer Post Procedure Diagnosis Same as Pre-procedure Electronic Signature(s) Signed: 09/03/2022 4:43:40 PM By: [name] RN, BSN Entered By: [name] on 09/03/2022 13:36:42 Knack, Keontay ([id number]) [id number] Page 2 of 16 -------------------------------------------------------------------------------- Compression Therapy Details [name], [name] 09/03/2022 12:30 PM Medical Record Number: [id number] Patient Account Number: [id number] Date of Birth/Sex: Treating RN: 23-Feb-1956 (66 y.o. [name] Primary Care Nashae Maudlin: [name] Other Clinician: Referring Noha Milberger: Treating Lashea Goda/Extender: [name] in Treatment: 17 Compression Therapy Performed for Wound Assessment: Wound #29 Right,Lateral Lower Leg Performed By: Clinician [name], Compression Type: Three Layer Post Procedure Diagnosis Same as Pre-procedure Electronic Signature(s) Signed: 09/03/2022 4:43:40 PM By: [name] RN, BSN Entered By: [name] on 09/03/2022 13:36:42 -------------------------------------------------------------------------------- Compression Therapy Details [name], [name] 09/03/2022 12:30 PM Medical Record Number: [id number] Patient Account Number: [id number] Date of Birth/Sex: Treating RN: [birth year] (66 y.o. [name] Primary Care Mateus Rewerts: [name] Other Clinician: Referring Floree Zuniga: Treating Meia Emley/Extender: [name] in Treatment: 67 Compression Therapy Performed for Wound Assessment: Wound #27 Left,Posterior Lower Leg Performed By: Clinician [name], Compression Type: Three Layer Post  Procedure Diagnosis Same as Pre-procedure Electronic Signature(s) Signed: 09/03/2022 4:43:40 PM By: [name] RN, BSN Entered By: [name] on 09/03/2022 13:36:42 -------------------------------------------------------------------------------- Encounter Discharge Information Details [name], [name] 09/03/2022 12:30 PM Medical Record Number: [id number] Patient Account Number: [id number] Date of Birth/Sex: Treating RN: 1956-05-05 (66 y.o. [name] Primary Care Richey Doolittle: [name] Other Clinician: Referring Luretta Everly: Treating Corsica Franson/Extender: [name] in Treatment: 2 Encounter Discharge Information Items Discharge Condition: Stable Ambulatory Status: Wheelchair Discharge Destination: Home Transportation: Private Auto Accompanied By: SELF Schedule Follow-up Appointment: Yes Clinical Summary of Care: Bejar, Alcus ([id number])  [id number] Page 3 of 16 Electronic Signature(s) Signed: 09/03/2022 4:43:40 PM By: [name] RN, BSN Entered By: [name] on 09/03/2022 13:38:44 -------------------------------------------------------------------------------- Lower Extremity Assessment Details [name], [name] 09/03/2022 12:30 PM Medical Record Number: [id number] Patient Account Number: [id number] Date of Birth/Sex: Treating RN: 11-24-1955 (66 y.o. Mare Ferrari Primary Care Jt Brabec: [name] Other Clinician: Referring Kamya Watling: Treating Sada Mazzoni/Extender: [name] in Treatment: 47 Edema Assessment Assessed: Shirlyn Goltz: No] [Right: No] Edema: [Left: Yes] [Right: Yes] Calf Left: Right: Point of Measurement: 37 cm From Medial Instep 41 cm 41.5 cm Ankle Left: Right: Point of Measurement: 12 cm From Medial Instep 27 cm 27 cm Vascular Assessment Pulses: Dorsalis Pedis Palpable: [Left:Yes] [Right:Yes] Electronic  Signature(s) Signed: 09/09/2022 3:55:49 PM By: Sharyn Creamer RN, BSN Entered By: Sharyn Creamer on 09/03/2022 13:10:23 -------------------------------------------------------------------------------- Multi Wound Chart Details [name], [name] 09/03/2022 12:30 PM Medical Record Number: [id number] Patient Account Number: [id number] Date of Birth/Sex: Treating RN: [birth year] (66 y.o. M) Primary Care Layli Capshaw: [name] Other Clinician: Referring Suhaib Guzzo: Treating Rutherford Alarie/Extender: [name] in Treatment: 53 Vital Signs Height(in): 77 Pulse(bpm): 85 Weight(lbs): 258 Blood Pressure(mmHg): 125/79 Body Mass Index(BMI): 30.6 Temperature(F): 98.6 Respiratory Rate(breaths/min): 18 [25:Photos: No Photos] Right, Medial Lower Leg Right, Medial Lower Leg Right, Medial Lower Leg Wound Location: Gradually Appeared Gradually Appeared Gradually Appeared Wounding Event: Venous Leg Ulcer Venous Leg Ulcer Venous Leg Ulcer Primary Etiology: Anemia, Sleep Apnea, Congestive Anemia, Sleep Apnea, Congestive Anemia, Sleep Apnea, Congestive Comorbid History: Heart Failure, Coronary Artery Heart Failure, Coronary Artery Heart Failure, Coronary Artery Disease, Hypertension, Peripheral Disease, Hypertension, Peripheral Disease, Hypertension, Peripheral Venous Disease, Type II Diabetes, Venous Disease, Type II Diabetes, Venous Disease, Type II Diabetes, Osteoarthritis, Neuropathy Osteoarthritis, Neuropathy Osteoarthritis, Neuropathy 09/07/2021 09/07/2021 09/07/2021 Date Acquired: 47 47 47 Weeks of Treatment: Open Open Open Wound Status: No No No Wound Recurrence: 5.7x2.5x0.2 5.7x2.5x0.2 5.7x2.5x0.2 Measurements L x W x D (cm) 11.192 11.192 11.192 A (cm) : rea 2.238 2.238 2.238 Volume (cm) : 6.90% 6.90% 6.90% % Reduction in Area: 37.90% 37.90% 37.90% % Reduction in Volume: Full Thickness Without Exposed Full Thickness Without  Exposed Full Thickness Without Exposed Classification: Support Structures Support Structures Support Structures Medium Medium Medium Exudate A mount: Serosanguineous Serosanguineous Serosanguineous Exudate Type: red, brown red, brown red, brown Exudate Color: Thickened Thickened Thickened Wound Margin: Large (67-100%) Large (67-100%) Large (67-100%) Granulation Amount: Red, Pink Red, Pink Red, Pink Granulation Quality: Small (1-33%) Small (1-33%) Small (1-33%) Necrotic Amount: Chilton Necrotic Tissue: Fat Layer (Subcutaneous Tissue): Yes Fat Layer (Subcutaneous Tissue): Yes Fat Layer (Subcutaneous Tissue): Yes Exposed Structures: Fascia: No Fascia: No Fascia: No Tendon: No Tendon: No Tendon: No Muscle: No Muscle: No Muscle: No Joint: No Joint: No Joint: No Bone: No Bone: No Bone: No Small (1-33%) Small (1-33%) Small (1-33%) Epithelialization: Excoriation: No Excoriation: No Excoriation: No Periwound Skin Texture: Induration: No Induration: No Induration: No Callus: No Callus: No Callus: No Crepitus: No Crepitus: No Crepitus: No Rash: No Rash: No Rash: No Scarring: No Scarring: No Scarring: No Maceration: No Maceration: No Maceration: No Periwound Skin Moisture: Dry/Scaly: No Dry/Scaly: No Dry/Scaly: No Atrophie Blanche: No Atrophie Blanche: No Atrophie Blanche: No Periwound Skin Color: Cyanosis: No Cyanosis: No Cyanosis: No Ecchymosis: No Ecchymosis: No Ecchymosis: No Erythema: No Erythema: No Erythema: No Hemosiderin Staining: No Hemosiderin Staining: No Hemosiderin Staining: No Mottled: No Mottled: No Mottled: No Pallor:  No Pallor: No Pallor: No Rubor: No Rubor: No Rubor: No N/A N/A N/A Temperature: Compression Therapy Compression Therapy Compression Therapy Procedures Performed: Wound Number: 27 29 N/A Photos: N/A Left, Posterior Lower Leg Right, Lateral Lower Leg N/A Wound  Location: Gradually Appeared Gradually Appeared N/A Wounding Event: Venous Leg Ulcer Venous Leg Ulcer N/A Primary Etiology: Anemia, Sleep Apnea, Congestive Anemia, Sleep Apnea, Congestive N/A Comorbid History: Heart Failure, Coronary Artery Heart Failure, Coronary Artery Disease, Hypertension, Peripheral Disease, Hypertension, Peripheral Venous Disease, Type II Diabetes, Venous Disease, Type II Diabetes, Osteoarthritis, Neuropathy Osteoarthritis, Neuropathy 09/07/2021 04/30/2022 N/A Date Acquired: 67 18 N/A Weeks of Treatment: Open Open N/A Wound Status: No No N/A Wound Recurrence: 13.5x16x0.4 1.2x1.1x0.3 N/A Measurements L x W x D (cm) 169.646 1.037 N/A A (cm) : rea 67.858 0.311 N/A Volume (cm) : Gondek, Vernice ([id number]) [id number] Page 5 of 16 34.50% -71.40% N/A % Reduction in Area: 12.70% -157.00% N/A % Reduction in Volume: Full Thickness With Exposed Support Full Thickness With Exposed Support N/A Classification: Structures Structures Medium Medium N/A Exudate A mount: Serosanguineous Serosanguineous N/A Exudate Type: red, brown red, brown N/A Exudate Color: Distinct, outline attached Distinct, outline attached N/A Wound Margin: Medium (34-66%) Medium (34-66%) N/A Granulation Amount: Red, Friable Red, Pink N/A Granulation Quality: Medium (34-66%) Medium (34-66%) N/A Necrotic Amount: Eschar, Adherent Slough Adherent Slough N/A Necrotic Tissue: Fat Layer (Subcutaneous Tissue): Yes Fat Layer (Subcutaneous Tissue): Yes N/A Exposed Structures: Fascia: No Fascia: No Tendon: No Tendon: No Muscle: No Muscle: No Joint: No Joint: No Bone: No Bone: No Small (1-33%) None N/A Epithelialization: Excoriation: No Excoriation: No N/A Periwound Skin Texture: Induration: No Induration: No Callus: No Callus: No Crepitus: No Crepitus: No Rash: No Rash: No Scarring: No Scarring: No Maceration: No Maceration: No N/A Periwound Skin  Moisture: Dry/Scaly: No Dry/Scaly: No Atrophie Blanche: No Atrophie Blanche: No N/A Periwound Skin Color: Cyanosis: No Cyanosis: No Ecchymosis: No Ecchymosis: No Erythema: No Erythema: No Hemosiderin Staining: No Hemosiderin Staining: No Mottled: No Mottled: No Pallor: No Pallor: No Rubor: No Rubor: No No Abnormality N/A N/A Temperature: Yes N/A N/A Tenderness on Palpation: Compression Therapy Compression Therapy N/A Procedures Performed: Treatment Notes Wound #25 (Lower Leg) Wound Laterality: Right, Medial Cleanser Soap and Water Discharge Instruction: May shower and wash wound with dial antibacterial soap and water prior to dressing change. Wound Cleanser Discharge Instruction: Cleanse the wound with wound cleanser prior to applying a clean dressing using gauze sponges, not tissue or cotton balls. Peri-Wound Care Sween Lotion (Moisturizing lotion) Discharge Instruction: Apply moisturizing lotion as directed Topical Primary Dressing Cutimed Sorbact Swab Discharge Instruction: Apply to wound bed as instructed Maxorb Extra Calcium Alginate Dressing, 4x4 in Discharge Instruction: Apply calcium alginate over the cutimed to help absorb drainage. Keystone Antibiotic spray Secondary Dressing ABD Pad, 8x10 Discharge Instruction: Apply over primary dressing as directed. Secured With Compression Wrap ThreePress (3 layer compression wrap) Discharge Instruction: Apply three layer compression as directed. Compression Stockings Add-Ons Wound #27 (Lower Leg) Wound Laterality: Left, Posterior Cleanser Soap and Water Mattie, Leone ([id number]) [id number] Page 6 of 16 Discharge Instruction: May shower and wash wound with dial antibacterial soap and water prior to dressing change. Wound Cleanser Discharge Instruction: Cleanse the wound with wound cleanser prior to applying a clean dressing using gauze sponges, not tissue or cotton balls. Peri-Wound  Care Sween Lotion (Moisturizing lotion) Discharge Instruction: Apply moisturizing lotion as directed Topical Primary Dressing Cutimed Sorbact Swab Discharge Instruction: Apply to wound bed as instructed Maxorb Extra Calcium Alginate  Dressing, 4x4 in Discharge Instruction: Apply calcium alginate over the cutimed to help absorb drainage. Keystone Antibiotic spray Secondary Dressing ABD Pad, 8x10 Discharge Instruction: Apply over primary dressing as directed. Secured With Compression Wrap ThreePress (3 layer compression wrap) Discharge Instruction: Apply three layer compression as directed. Compression Stockings Add-Ons Wound #29 (Lower Leg) Wound Laterality: Right, Lateral Cleanser Soap and Water Discharge Instruction: May shower and wash wound with dial antibacterial soap and water prior to dressing change. Wound Cleanser Discharge Instruction: Cleanse the wound with wound cleanser prior to applying a clean dressing using gauze sponges, not tissue or cotton balls. Peri-Wound Care Sween Lotion (Moisturizing lotion) Discharge Instruction: Apply moisturizing lotion as directed Topical Primary Dressing Cutimed Sorbact Swab Discharge Instruction: Apply to wound bed as instructed Maxorb Extra Calcium Alginate Dressing, 4x4 in Discharge Instruction: Apply calcium alginate over the cutimed to help absorb drainage. Keystone Antibiotic spray Secondary Dressing ABD Pad, 8x10 Discharge Instruction: Apply over primary dressing as directed. Secured With Compression Wrap ThreePress (3 layer compression wrap) Discharge Instruction: Apply three layer compression as directed. Compression Stockings Add-Ons Electronic Signature(s) Signed: 09/03/2022 4:30:17 PM By: [name] DO Entered By: [name] on 09/03/2022 13:59:36 Edmister, Chike ([id number]) [id number] Page 7 of  16 -------------------------------------------------------------------------------- Multi-Disciplinary Care Plan Details [name], [name] 09/03/2022 12:30 PM Medical Record Number: [id number] Patient Account Number: [id number] Date of Birth/Sex: Treating RN: Oct 01, 1955 (66 y.o. [name] Primary Care Kerri Asche: [name] Other Clinician: Referring Koryn Charlot: Treating Temika Sutphin/Extender: [name] in Treatment: 13 Welcome reviewed with physician Active Inactive Venous Leg Ulcer Nursing Diagnoses: Actual venous Insuffiency (use after diagnosis is confirmed) Goals: Patient will maintain optimal edema control Date Initiated: 10/08/2021 Target Resolution Date: 11/21/2022 Goal Status: Active Interventions: Compression as ordered Provide education on venous insufficiency Treatment Activities: Therapeutic compression applied : 10/08/2021 Notes: Wound/Skin Impairment Nursing Diagnoses: Impaired tissue integrity Goals: Patient/caregiver will verbalize understanding of skin care regimen Date Initiated: 10/08/2021 Target Resolution Date: 10/25/2022 Goal Status: Active Ulcer/skin breakdown will have a volume reduction of 30% by week 4 Date Initiated: 10/08/2021 Date Inactivated: 11/05/2021 Target Resolution Date: 11/05/2021 Goal Status: Unmet Unmet Reason: infection Ulcer/skin breakdown will have a volume reduction of 50% by week 8 Date Initiated: 11/05/2021 Date Inactivated: 06/04/2022 Target Resolution Date: 05/25/2022 Goal Status: Met Interventions: Assess patient/caregiver ability to perform ulcer/skin care regimen upon admission and as needed Assess ulceration(s) every visit Provide education on ulcer and skin care Treatment Activities: Topical wound management initiated : 10/08/2021 Notes: Electronic Signature(s) Signed: 09/03/2022 4:43:40 PM By: [name] RN, BSN Entered By: [name] on  09/03/2022 Weston Augustine, Daniele ([id number]) [id number] Page 8 of 16 -------------------------------------------------------------------------------- Pain Assessment Details [name], [name] 09/03/2022 12:30 PM Medical Record Number: [id number] Patient Account Number: [id number] Date of Birth/Sex: Treating RN: November 05, 1955 (66 y.o. Mare Ferrari Primary Care Keagen Heinlen: [name] Other Clinician: Referring Brienna Bass: Treating Cheryal Salas/Extender: [name] in Treatment: 44 Active Problems Location of Pain Severity and Description of Pain Patient Has Paino Yes Site Locations Rate the pain. Current Pain Level: 8 Pain Management and Medication Current Pain Management: Electronic Signature(s) Signed: 09/09/2022 3:55:49 PM By: Sharyn Creamer RN, BSN Entered By: Sharyn Creamer on 09/03/2022 13:08:44 -------------------------------------------------------------------------------- Patient/Caregiver Education Details Patient Name: Date of Service: Lorel Monaco 12/12/2023andnbsp12:30 PM Medical Record Number: [id number] Patient Account Number: [id number] Date of Birth/Gender: Treating RN: 04-29-1956 (66 y.o. [name] Primary Care  Physician: [name] Other Clinician: Referring Physician: Treating Physician/Extender: [name] in Treatment: 13 Education Assessment Education Provided To: Patient Education Topics Provided Wound/Skin Impairment: Handouts: Skin Care Do's and Dont's Methods: Explain/Verbal Responses: Reinforcements needed Burdin, Kawika ([id number]) [id number] Page 9 of 16 Electronic Signature(s) Signed: 09/03/2022 4:43:40 PM By: [name] RN, BSN Entered By: [name] on 09/03/2022 12:46:25 -------------------------------------------------------------------------------- Wound Assessment Details Patient Name:  Date of Service: [name], [name] 09/03/2022 12:30 PM Medical Record Number: [id number] Patient Account Number: [id number] Date of Birth/Sex: Treating RN: 1956/07/29 (66 y.o. Mare Ferrari Primary Care Tamarick Kovalcik: [name] Other Clinician: Referring Joleena Weisenburger: Treating Khali Perella/Extender: [name] in Treatment: 47 Wound Status Wound Number: 25 Primary Venous Leg Ulcer Etiology: Wound Location: Right, Medial Lower Leg Wound Open Wounding Event: Gradually Appeared Status: Date Acquired: 09/07/2021 Comorbid Anemia, Sleep Apnea, Congestive Heart Failure, Coronary Artery Weeks Of Treatment: 47 History: Disease, Hypertension, Peripheral Venous Disease, Type II Clustered Wound: No Diabetes, Osteoarthritis, Neuropathy Wound Measurements Length: (cm) 5.7 Width: (cm) 2.5 Depth: (cm) 0.2 Area: (cm) 11.192 Volume: (cm) 2.238 % Reduction in Area: 6.9% % Reduction in Volume: 37.9% Epithelialization: Small (1-33%) Tunneling: No Undermining: No Wound Description Classification: Full Thickness Without Exposed Suppor Wound Margin: Thickened Exudate Amount: Medium Exudate Type: Serosanguineous Exudate Color: red, brown t Structures Foul Odor After Cleansing: No Slough/Fibrino No Wound Bed Granulation Amount: Large (67-100%) Exposed Structure Granulation Quality: Red, Pink Fascia Exposed: No Necrotic Amount: Small (1-33%) Fat Layer (Subcutaneous Tissue) Exposed: Yes Necrotic Quality: Adherent Slough Tendon Exposed: No Muscle Exposed: No Joint Exposed: No Bone Exposed: No Periwound Skin Texture Texture Color No Abnormalities Noted: No No Abnormalities Noted: No Callus: No Atrophie Blanche: No Crepitus: No Cyanosis: No Excoriation: No Ecchymosis: No Induration: No Erythema: No Rash: No Hemosiderin Staining: No Scarring: No Mottled: No Pallor: No Moisture Rubor: No No Abnormalities Noted: No Dry / Scaly: No Maceration: No Electronic  Signature(s) Signed: 09/09/2022 3:55:49 PM By: Sharyn Creamer RN, BSN Entered By: Sharyn Creamer on 09/03/2022 13:13:59 Roundtree, Palmer ([id number]) [id number] Page 10 of 16 -------------------------------------------------------------------------------- Wound Assessment Details [name], [name] 09/03/2022 12:30 PM Medical Record Number: [id number] Patient Account Number: [id number] Date of Birth/Sex: Treating RN: Jul 28, 1956 (66 y.o. Mare Ferrari Primary Care Elinora Weigand: [name] Other Clinician: Referring Jaedin Regina: Treating Olga Bourbeau/Extender: [name] in Treatment: 47 Wound Status Wound Number: 25 Primary Venous Leg Ulcer Etiology: Wound Location: Right, Medial Lower Leg Wound Open Wounding Event: Gradually Appeared Status: Date Acquired: 09/07/2021 Comorbid Anemia, Sleep Apnea, Congestive Heart Failure, Coronary Artery Weeks Of Treatment: 47 History: Disease, Hypertension, Peripheral Venous Disease, Type II Clustered Wound: No Diabetes, Osteoarthritis, Neuropathy Wound Measurements Length: (cm) 5.7 Width: (cm) 2.5 Depth: (cm) 0.2 Area: (cm) 11.192 Volume: (cm) 2.238 % Reduction in Area: 6.9% % Reduction in Volume: 37.9% Epithelialization: Small (1-33%) Tunneling: No Undermining: No Wound Description Classification: Full Thickness Without Exposed Suppor Wound Margin: Thickened Exudate Amount: Medium Exudate Type: Serosanguineous Exudate Color: red, brown t Structures Foul Odor After Cleansing: No Slough/Fibrino No Wound Bed Granulation Amount: Large (67-100%) Exposed Structure Granulation Quality: Red, Pink Fascia Exposed: No Necrotic Amount: Small (1-33%) Fat Layer (Subcutaneous Tissue) Exposed: Yes Necrotic Quality: Adherent Slough Tendon Exposed: No Muscle Exposed: No Joint Exposed: No Bone Exposed: No Periwound Skin Texture Texture Color No Abnormalities Noted:  No No Abnormalities Noted: No Callus: No Atrophie Blanche: No Crepitus: No Cyanosis: No Excoriation: No Ecchymosis: No Induration: No  Erythema: No Rash: No Hemosiderin Staining: No Scarring: No Mottled: No Pallor: No Moisture Rubor: No No Abnormalities Noted: No Dry / Scaly: No Maceration: No Electronic Signature(s) Signed: 09/09/2022 3:55:49 PM By: Sharyn Creamer RN, BSN Entered By: Sharyn Creamer on 09/03/2022 13:14:05 Nauta, Zameer ([id number]) [id number] Page 11 of 16 -------------------------------------------------------------------------------- Wound Assessment Details [name], [name] 09/03/2022 12:30 PM Medical Record Number: [id number] Patient Account Number: [id number] Date of Birth/Sex: Treating RN: December 26, 1955 (66 y.o. Mare Ferrari Primary Care Jonathon Castelo: [name] Other Clinician: Referring Acheron Sugg: Treating Undrea Archbold/Extender: [name] in Treatment: 47 Wound Status Wound Number: 25 Primary Venous Leg Ulcer Etiology: Wound Location: Right, Medial Lower Leg Wound Open Wounding Event: Gradually Appeared Status: Date Acquired: 09/07/2021 Comorbid Anemia, Sleep Apnea, Congestive Heart Failure, Coronary Artery Weeks Of Treatment: 47 History: Disease, Hypertension, Peripheral Venous Disease, Type II Clustered Wound: No Diabetes, Osteoarthritis, Neuropathy Photos Wound Measurements Length: (cm) 5.7 Width: (cm) 2.5 Depth: (cm) 0.2 Area: (cm) 11.192 Volume: (cm) 2.238 % Reduction in Area: 6.9% % Reduction in Volume: 37.9% Epithelialization: Small (1-33%) Tunneling: No Undermining: No Wound Description Classification: Full Thickness Without Exposed Suppor Wound Margin: Thickened Exudate Amount: Medium Exudate Type: Serosanguineous Exudate Color: red, brown t Structures Foul Odor After Cleansing: No Slough/Fibrino No Wound Bed Granulation Amount: Large  (67-100%) Exposed Structure Granulation Quality: Red, Pink Fascia Exposed: No Necrotic Amount: Small (1-33%) Fat Layer (Subcutaneous Tissue) Exposed: Yes Necrotic Quality: Adherent Slough Tendon Exposed: No Muscle Exposed: No Joint Exposed: No Bone Exposed: No Periwound Skin Texture Texture Color No Abnormalities Noted: No No Abnormalities Noted: No Callus: No Atrophie Blanche: No Crepitus: No Cyanosis: No Excoriation: No Ecchymosis: No Induration: No Erythema: No Rash: No Hemosiderin Staining: No Scarring: No Mottled: No Pallor: No Moisture Rubor: No No Abnormalities Noted: No Dry / Scaly: No Maceration: No Treatment Notes Wound #25 (Lower Leg) Wound Laterality: Right, Medial Phariss, Eren ([id number]) [id number] Page 12 of 16 Cleanser Soap and Water Discharge Instruction: May shower and wash wound with dial antibacterial soap and water prior to dressing change. Wound Cleanser Discharge Instruction: Cleanse the wound with wound cleanser prior to applying a clean dressing using gauze sponges, not tissue or cotton balls. Peri-Wound Care Sween Lotion (Moisturizing lotion) Discharge Instruction: Apply moisturizing lotion as directed Topical Primary Dressing Cutimed Sorbact Swab Discharge Instruction: Apply to wound bed as instructed Maxorb Extra Calcium Alginate Dressing, 4x4 in Discharge Instruction: Apply calcium alginate over the cutimed to help absorb drainage. Keystone Antibiotic spray Secondary Dressing ABD Pad, 8x10 Discharge Instruction: Apply over primary dressing as directed. Secured With Compression Wrap ThreePress (3 layer compression wrap) Discharge Instruction: Apply three layer compression as directed. Compression Stockings Add-Ons Electronic Signature(s) Signed: 09/09/2022 3:55:49 PM By: Sharyn Creamer RN, BSN Entered By: Sharyn Creamer on 09/03/2022  13:15:55 -------------------------------------------------------------------------------- Wound Assessment Details [name], [name] 09/03/2022 12:30 PM Medical Record Number: [id number] Patient Account Number: [id number] Date of Birth/Sex: Treating RN: Sep 12, 1956 (66 y.o. Mare Ferrari Primary Care Arleigh Odowd: [name] Other Clinician: Referring Courtenay Hirth: Treating Ariaunna Longsworth/Extender: [name] in Treatment: 47 Wound Status Wound Number: 27 Primary Venous Leg Ulcer Etiology: Wound Location: Left, Posterior Lower Leg Wound Open Wounding Event: Gradually Appeared Status: Date Acquired: 09/07/2021 Comorbid Anemia, Sleep Apnea, Congestive Heart Failure, Coronary Artery Weeks Of Treatment: 47 History: Disease, Hypertension, Peripheral Venous Disease, Type II Clustered Wound: No Diabetes, Osteoarthritis, Neuropathy Photos Mcnellis, Lavonne ([id number]) [id number] Page 13 of 16  Wound Measurements Length: (cm) 13.5 Width: (cm) 16 Depth: (cm) 0.4 Area: (cm) 169.646 Volume: (cm) 67.858 % Reduction in Area: 34.5% % Reduction in Volume: 12.7% Epithelialization: Small (1-33%) Tunneling: No Undermining: No Wound Description Classification: Full Thickness With Exposed Support Structures Wound Margin: Distinct, outline attached Exudate Amount: Medium Exudate Type: Serosanguineous Exudate Color: red, brown Foul Odor After Cleansing: No Slough/Fibrino No Wound Bed Granulation Amount: Medium (34-66%) Exposed Structure Granulation Quality: Red, Friable Fascia Exposed: No Necrotic Amount: Medium (34-66%) Fat Layer (Subcutaneous Tissue) Exposed: Yes Necrotic Quality: Eschar, Adherent Slough Tendon Exposed: No Muscle Exposed: No Joint Exposed: No Bone Exposed: No Periwound Skin Texture Texture Color No Abnormalities Noted: No No Abnormalities Noted: No Callus: No Atrophie Blanche: No Crepitus:  No Cyanosis: No Excoriation: No Ecchymosis: No Induration: No Erythema: No Rash: No Hemosiderin Staining: No Scarring: No Mottled: No Pallor: No Moisture Rubor: No No Abnormalities Noted: No Dry / Scaly: No Temperature / Pain Maceration: No Temperature: No Abnormality Tenderness on Palpation: Yes Treatment Notes Wound #27 (Lower Leg) Wound Laterality: Left, Posterior Cleanser Soap and Water Discharge Instruction: May shower and wash wound with dial antibacterial soap and water prior to dressing change. Wound Cleanser Discharge Instruction: Cleanse the wound with wound cleanser prior to applying a clean dressing using gauze sponges, not tissue or cotton balls. Peri-Wound Care Sween Lotion (Moisturizing lotion) Discharge Instruction: Apply moisturizing lotion as directed Topical Primary Dressing Cutimed Sorbact Swab Discharge Instruction: Apply to wound bed as instructed Maxorb Extra Calcium Alginate Dressing, 4x4 in Discharge Instruction: Apply calcium alginate over the cutimed to help absorb drainage. Keystone Antibiotic spray Krupp, Caydan ([id number]) [phone].pdf Page 14 of 16 Secondary Dressing ABD Pad, 8x10 Discharge Instruction: Apply over primary dressing as directed. Secured With Compression Wrap ThreePress (3 layer compression wrap) Discharge Instruction: Apply three layer compression as directed. Compression Stockings Add-Ons Electronic Signature(s) Signed: 09/09/2022 3:55:49 PM By: Sharyn Creamer RN, BSN Entered By: Sharyn Creamer on 09/03/2022 13:18:56 -------------------------------------------------------------------------------- Wound Assessment Details [name], [name] 09/03/2022 12:30 PM Medical Record Number: [id number] Patient Account Number: [id number] Date of Birth/Sex: Treating RN: [birth year] (66 y.o. Mare Ferrari Primary Care Aryam Zhan: [name] Other Clinician: Referring  Jehan Bonano: Treating Petro Talent/Extender: [name] in Treatment: 47 Wound Status Wound Number: 29 Primary Venous Leg Ulcer Etiology: Wound Location: Right, Lateral Lower Leg Wound Open Wounding Event: Gradually Appeared Status: Date Acquired: 04/30/2022 Comorbid Anemia, Sleep Apnea, Congestive Heart Failure, Coronary Artery Weeks Of Treatment: 18 History: Disease, Hypertension, Peripheral Venous Disease, Type II Clustered Wound: No Diabetes, Osteoarthritis, Neuropathy Photos Wound Measurements Length: (cm) 1.2 Width: (cm) 1.1 Depth: (cm) 0.3 Area: (cm) 1.037 Volume: (cm) 0.311 % Reduction in Area: -71.4% % Reduction in Volume: -157% Epithelialization: None Tunneling: No Undermining: No Wound Description Classification: Full Thickness With Exposed Suppo Wound Margin: Distinct, outline attached Exudate Amount: Medium Exudate Type: Serosanguineous Exudate Color: red, brown rt Structures Foul Odor After Cleansing: No Slough/Fibrino Yes Wound Bed Granulation Amount: Medium (34-66%) Exposed Structure Granulation Quality: Red, Pink Fascia Exposed: No Buchanan, Meril ([id number]) [id number].pdf Page 15 of 16 Necrotic Amount: Medium (34-66%) Fat Layer (Subcutaneous Tissue) Exposed: Yes Necrotic Quality: Adherent Slough Tendon Exposed: No Muscle Exposed: No Joint Exposed: No Bone Exposed: No Periwound Skin Texture Texture Color No Abnormalities Noted: No No Abnormalities Noted: No Callus: No Atrophie Blanche: No Crepitus: No Cyanosis: No Excoriation: No Ecchymosis: No Induration: No Erythema: No Rash: No Hemosiderin Staining: No Scarring: No Mottled: No Pallor: No Moisture  Rubor: No No Abnormalities Noted: No Dry / Scaly: No Maceration: No Treatment Notes Wound #29 (Lower Leg) Wound Laterality: Right, Lateral Cleanser Soap and Water Discharge Instruction: May shower and wash wound with dial antibacterial soap  and water prior to dressing change. Wound Cleanser Discharge Instruction: Cleanse the wound with wound cleanser prior to applying a clean dressing using gauze sponges, not tissue or cotton balls. Peri-Wound Care Sween Lotion (Moisturizing lotion) Discharge Instruction: Apply moisturizing lotion as directed Topical Primary Dressing Cutimed Sorbact Swab Discharge Instruction: Apply to wound bed as instructed Maxorb Extra Calcium Alginate Dressing, 4x4 in Discharge Instruction: Apply calcium alginate over the cutimed to help absorb drainage. Keystone Antibiotic spray Secondary Dressing ABD Pad, 8x10 Discharge Instruction: Apply over primary dressing as directed. Secured With Compression Wrap ThreePress (3 layer compression wrap) Discharge Instruction: Apply three layer compression as directed. Compression Stockings Add-Ons Electronic Signature(s) Signed: 09/09/2022 3:55:49 PM By: Sharyn Creamer RN, BSN Entered By: Sharyn Creamer on 09/03/2022 13:19:37 -------------------------------------------------------------------------------- Vitals Details [name], [name] 09/03/2022 12:30 PM Medical Record Number: [id number] Patient Account Number: [id number] Date of Birth/Sex: Treating RN: [birth year] (66 y.o. Mare Ferrari Primary Care Gulianna Hornsby: [name] Other Clinician: Cozart, Cashis ([id number]) [phone].pdf Page 16 of 16 Referring Alexus Galka: Treating Ceairra Mccarver/Extender: [name] in Treatment: 47 Vital Signs Time Taken: 12:45 Temperature (F): 98.6 Height (in): 77 Pulse (bpm): 85 Weight (lbs): 258 Respiratory Rate (breaths/min): 18 Body Mass Index (BMI): 30.6 Blood Pressure (mmHg): 125/79 Reference Range: 80 - 120 mg / dl Electronic Signature(s) Signed: 09/09/2022 3:55:49 PM By: Sharyn Creamer RN, BSN Entered By: Sharyn Creamer on 09/03/2022 13:08:35

## 2022-09-10 DIAGNOSIS — R197 Diarrhea, unspecified: Secondary | ICD-10-CM | POA: Diagnosis not present

## 2022-09-11 DIAGNOSIS — Z79899 Other long term (current) drug therapy: Secondary | ICD-10-CM | POA: Diagnosis not present

## 2022-09-13 DIAGNOSIS — I872 Venous insufficiency (chronic) (peripheral): Secondary | ICD-10-CM | POA: Diagnosis not present

## 2022-09-13 DIAGNOSIS — I5040 Unspecified combined systolic (congestive) and diastolic (congestive) heart failure: Secondary | ICD-10-CM | POA: Diagnosis not present

## 2022-09-13 DIAGNOSIS — I10 Essential (primary) hypertension: Secondary | ICD-10-CM | POA: Diagnosis not present

## 2022-09-17 DIAGNOSIS — D508 Other iron deficiency anemias: Secondary | ICD-10-CM | POA: Diagnosis not present

## 2022-09-18 DIAGNOSIS — E119 Type 2 diabetes mellitus without complications: Secondary | ICD-10-CM | POA: Diagnosis not present

## 2022-09-19 DIAGNOSIS — E1165 Type 2 diabetes mellitus with hyperglycemia: Secondary | ICD-10-CM | POA: Diagnosis not present

## 2022-09-30 ENCOUNTER — Encounter (HOSPITAL_BASED_OUTPATIENT_CLINIC_OR_DEPARTMENT_OTHER): Payer: Medicare HMO | Attending: Internal Medicine | Admitting: Internal Medicine

## 2022-09-30 DIAGNOSIS — I890 Lymphedema, not elsewhere classified: Secondary | ICD-10-CM | POA: Diagnosis not present

## 2022-09-30 DIAGNOSIS — E11622 Type 2 diabetes mellitus with other skin ulcer: Secondary | ICD-10-CM | POA: Insufficient documentation

## 2022-09-30 DIAGNOSIS — I5032 Chronic diastolic (congestive) heart failure: Secondary | ICD-10-CM | POA: Insufficient documentation

## 2022-09-30 DIAGNOSIS — E1151 Type 2 diabetes mellitus with diabetic peripheral angiopathy without gangrene: Secondary | ICD-10-CM | POA: Insufficient documentation

## 2022-09-30 DIAGNOSIS — L97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ed: Secondary | ICD-10-CM | POA: Diagnosis not present

## 2022-09-30 DIAGNOSIS — E440 Moderate protein-calorie malnutrition: Secondary | ICD-10-CM | POA: Diagnosis not present

## 2022-09-30 DIAGNOSIS — I87333 Chronic venous hypertension (idiopathic) with ulcer and inflammation of bilateral lower extremity: Secondary | ICD-10-CM | POA: Insufficient documentation

## 2022-09-30 DIAGNOSIS — L97912 Non-pressure chronic ulcer of unspecified part of right lower leg with fat layer exposed: Secondary | ICD-10-CM | POA: Diagnosis not present

## 2022-09-30 DIAGNOSIS — I110 Hypertensive heart disease with heart failure: Secondary | ICD-10-CM | POA: Insufficient documentation

## 2022-10-01 NOTE — Progress Notes (Addendum)
[name], [name] ([id number]) [id number] Page 1 of 8 Visit Report for 09/30/2022 Arrival Information Details Patient Name: Date of Service: [name] 09/30/2022 12:30 PM Medical Record Number: [id number] Patient Account Number: [id number] Date of Birth/Sex: Treating RN: 07/26/1956 (67 y.o. M) Primary Care [name]: [name] Other Clinician: Referring [name]: Treating [name]/Extender: [name] in Treatment: 46 Visit Information History Since Last Visit Added or deleted any medications: No Patient Arrived: Wheel Chair Any new allergies or adverse reactions: No Arrival Time: 12:36 Had [name] fall or experienced change in No Accompanied By: caregiver activities of daily living that may affect Transfer Assistance: None risk of falls: Patient Identification Verified: Yes Signs or symptoms of abuse/neglect since last visito No Secondary Verification Process Completed: Yes Hospitalized since last visit: No Patient Requires Transmission-Based No Implantable device outside of the clinic excluding No Precautions: cellular tissue based products placed in the center Patient Has Alerts: Yes since last visit: Patient Alerts: Patient on Blood Thinner Has Compression in Place as Prescribed: No *NO BLOOD Pain Present Now: Yes PRODUCTS* Electronic Signature(s) Signed: 09/30/2022 6:29:44 PM By: [name] Stall RN, BSN Entered By: [name] on 09/30/2022 13:35:39 -------------------------------------------------------------------------------- Encounter Discharge Information Details Patient Name: Date of Service: [name] 09/30/2022 12:30 PM Medical Record Number: [id number] Patient Account Number: [id number] Date of Birth/Sex: Treating RN: Apr 02, 1956 (67 y.o. [name] Primary Care [name]: [name] Other Clinician: Referring [name]: Treating [name]/Extender: [name] in Treatment:  27 Encounter Discharge Information Items Post Procedure Vitals Discharge Condition: Stable Temperature (F): 98.5 Ambulatory Status: Wheelchair Pulse (bpm): 70 Discharge Destination: Skilled Nursing Facility Respiratory Rate (breaths/min): 20 Telephoned: No Blood Pressure (mmHg): 149/70 Orders Sent: Yes Transportation: Private Auto Accompanied By: [name] Schedule Follow-up Appointment: Yes Clinical Summary of Care: Electronic Signature(s) Signed: 09/30/2022 6:29:44 PM By: [name] Stall RN, BSN Entered By: [name] on 09/30/2022 13:11:47 [name], [name] ([id number]) [id number] Page 2 of 8 -------------------------------------------------------------------------------- Lower Extremity Assessment Details Patient Name: Date of Service: [name] 09/30/2022 12:30 PM Medical Record Number: [id number] Patient Account Number: [id number] Date of Birth/Sex: Treating RN: [birth year] (67 y.o. M) Primary Care [name]: [name] Other Clinician: Referring [name]: Treating [name]/Extender: [name] in Treatment: 51 Edema Assessment Assessed: [Left: No] [Right: No] Edema: [Left: Yes] [Right: Yes] Calf Left: Right: Point of Measurement: 37 cm From Medial Instep 42 cm 41 cm Ankle Left: Right: Point of Measurement: 12 cm From Medial Instep 27 cm 27.5 cm Electronic Signature(s) Signed: 09/30/2022 4:45:41 PM By: [name] Entered By: [name] on 09/30/2022 12:39:44 -------------------------------------------------------------------------------- Multi Wound Chart Details Patient Name: Date of Service: [name] 09/30/2022 12:30 PM Medical Record Number: [id number] Patient Account Number: [id number] Date of Birth/Sex: Treating RN: [birth year] (67 y.o. M) Primary Care [name]: [name] Other Clinician: Referring [name]: Treating [name]/Extender: [name] in Treatment:  51 Vital Signs Height(in): 77 Pulse(bpm): 70 Weight(lbs): 258 Blood Pressure(mmHg): 149/70 Body Mass Index(BMI): 30.6 Temperature(F): 98.5 Respiratory Rate(breaths/min): 18 [25:Photos:] Right, Medial Lower Leg Left, Posterior Lower Leg Right, Lateral Lower Leg Wound Location: Gradually Appeared Gradually Appeared Gradually Appeared Wounding Event: Venous Leg Ulcer Venous Leg Ulcer Venous Leg Ulcer Primary Etiology: Anemia, Sleep Apnea, Congestive Anemia, Sleep Apnea, Congestive Anemia, Sleep Apnea, Congestive Comorbid History: Heart Failure, Coronary Artery Heart Failure, Coronary Artery Heart Failure, Coronary Artery Disease, Hypertension, Peripheral Disease, Hypertension, Peripheral Disease, Hypertension, Peripheral Venous Disease, Type II Diabetes, Venous Disease, Type II Diabetes, Venous  Disease, Type II Diabetes, Osteoarthritis, Neuropathy Osteoarthritis, Neuropathy Osteoarthritis, Neuropathy 09/07/2021 09/07/2021 04/30/2022 Date Acquired: [name], [name] ([id number]) [id number] Page 3 of 8 51 51 21 Weeks of Treatment: Open Open Open Wound Status: No No No Wound Recurrence: 5.5x2.6x0.1 19.5x18x0.4 1x0.9x0.2 Measurements L x W x D (cm) 11.231 275.675 0.707 [name] (cm) : rea 1.123 110.27 0.141 Volume (cm) : 6.50% -6.40% -16.90% % Reduction in Area: 68.80% -41.80% -16.50% % Reduction in Volume: Full Thickness Without Exposed Full Thickness With Exposed Support Full Thickness With Exposed Support Classification: Support Structures Structures Structures Medium Medium Medium Exudate [name] mount: Serosanguineous Serosanguineous Serosanguineous Exudate Type: red, brown red, brown red, brown Exudate Color: Thickened Distinct, outline attached Distinct, outline attached Wound Margin: Large (67-100%) Medium (34-66%) Medium (34-66%) Granulation [name] mount: Red, Pink Red, Friable Red, Pink Granulation Quality: Small (1-33%) Medium (34-66%) Medium  (34-66%) Necrotic [name] mount: Adherent Slough Eschar, Adherent Slough Adherent Slough Necrotic Tissue: Fat Layer (Subcutaneous Tissue): Yes Fat Layer (Subcutaneous Tissue): Yes Fat Layer (Subcutaneous Tissue): Yes Exposed Structures: Fascia: No Fascia: No Fascia: No Tendon: No Tendon: No Tendon: No Muscle: No Muscle: No Muscle: No Joint: No Joint: No Joint: No Bone: No Bone: No Bone: No Small (1-33%) Small (1-33%) None Epithelialization: Debridement - Excisional Debridement - Excisional Debridement - Excisional Debridement: Pre-procedure Verification/Time Out 12:55 12:55 12:55 Taken: Lidocaine 4% Topical Solution Lidocaine 4% Topical Solution Lidocaine 4% Topical Solution Pain Control: Subcutaneous, Slough Subcutaneous, Slough Subcutaneous, Slough Tissue Debrided: Skin/Subcutaneous Tissue Skin/Subcutaneous Tissue Skin/Subcutaneous Tissue Level: 14.3 351 0.9 Debridement [name] (sq cm): rea Curette Curette Curette Instrument: Minimum Minimum Minimum Bleeding: Pressure Pressure Pressure Hemostasis [name] chieved: 0 0 0 Procedural Pain: 0 0 0 Post Procedural Pain: Procedure was tolerated well Procedure was tolerated well Procedure was tolerated well Debridement Treatment Response: 5.5x2.6x0.1 19.5x18x0.4 1x0.9x0.2 Post Debridement Measurements L x W x D (cm) 1.123 110.27 0.141 Post Debridement Volume: (cm) Excoriation: No Excoriation: No Excoriation: No Periwound Skin Texture: Induration: No Induration: No Induration: No Callus: No Callus: No Callus: No Crepitus: No Crepitus: No Crepitus: No Rash: No Rash: No Rash: No Scarring: No Scarring: No Scarring: No Maceration: No Maceration: No Maceration: No Periwound Skin Moisture: Dry/Scaly: No Dry/Scaly: No Dry/Scaly: No Atrophie Blanche: No Atrophie Blanche: No Atrophie Blanche: No Periwound Skin Color: Cyanosis: No Cyanosis: No Cyanosis: No Ecchymosis: No Ecchymosis: No Ecchymosis: No Erythema:  No Erythema: No Erythema: No Hemosiderin Staining: No Hemosiderin Staining: No Hemosiderin Staining: No Mottled: No Mottled: No Mottled: No Pallor: No Pallor: No Pallor: No Rubor: No Rubor: No Rubor: No [name] No Abnormality [name] Temperature: [name] Yes [name] Tenderness on Palpation: Debridement Debridement Debridement Procedures Performed: Treatment Notes Electronic Signature(s) Signed: 09/30/2022 2:02:55 PM By: Geralyn Corwin DO Entered By: Geralyn Corwin on 09/30/2022 13:08:30 -------------------------------------------------------------------------------- Multi-Disciplinary Care Plan Details Patient Name: Date of Service: [name] 09/30/2022 12:30 PM Medical Record Number: [id number] Patient Account Number: [id number] Date of Birth/Sex: Treating RN: 1956/09/08 (67 y.o. [name] Primary Care Cartina Brousseau: [name] Other Clinician: Referring Ellissa Ayo: Treating Rosie Torrez/Extender: Francisca December Moder, Atom ([id number]) [id number] Page 4 of 8 Weeks in Treatment: 51 Multidisciplinary Care Plan reviewed with physician Active Inactive Electronic Signature(s) Signed: 03/25/2023 5:13:28 PM By: [name] Stall RN, BSN Signed: 06/05/2023 9:38:11 AM By: Fonnie Mu RN Previous Signature: 09/30/2022 6:29:44 PM Version By: [name] Stall RN, BSN Entered By: Fonnie Mu on 11/18/2022 13:35:15 -------------------------------------------------------------------------------- Pain Assessment Details Patient Name: Date of Service: [name] 09/30/2022 12:30 PM Medical Record Number: [id number]  Patient Account Number: [id number] Date of Birth/Sex: Treating RN: Dec 12, 1955 (67 y.o. M) Primary Care Alishah Schulte: [name] Other Clinician: Referring Braun Rocca: Treating Cire Clute/Extender: [name] in Treatment: 69 Active Problems Location of Pain Severity and Description of Pain Patient Has Paino  Yes Site Locations Pain Location: Generalized Pain, Pain in Ulcers Rate the pain. Current Pain Level: 8 Pain Management and Medication Current Pain Management: Electronic Signature(s) Signed: 09/30/2022 4:45:41 PM By: [name] Entered By: [name] on 09/30/2022 12:37:50 -------------------------------------------------------------------------------- Wound Assessment Details Patient Name: Date of Service: [name] 09/30/2022 12:30 PM Medical Record Number: [id number] Patient Account Number: [id number] Date of Birth/Sex: Treating RN: 1956/02/27 (67 y.o. M) [name], [name] Balls ([id number]) [id number] Page 5 of 8 Primary Care Cash Duce: [name] Other Clinician: Referring Aissata Wilmore: Treating Mariachristina Holle/Extender: [name] in Treatment: 51 Wound Status Wound Number: 25 Primary Venous Leg Ulcer Etiology: Wound Location: Right, Medial Lower Leg Wound Open Wounding Event: Gradually Appeared Status: Date Acquired: 09/07/2021 Comorbid Anemia, Sleep Apnea, Congestive Heart Failure, Coronary Artery Weeks Of Treatment: 51 History: Disease, Hypertension, Peripheral Venous Disease, Type II Clustered Wound: No Diabetes, Osteoarthritis, Neuropathy Photos Wound Measurements Length: (cm) 5.5 Width: (cm) 2.6 Depth: (cm) 0.1 Area: (cm) 11.231 Volume: (cm) 1.123 % Reduction in Area: 6.5% % Reduction in Volume: 68.8% Epithelialization: Small (1-33%) Wound Description Classification: Full Thickness Without Exposed Suppor Wound Margin: Thickened Exudate Amount: Medium Exudate Type: Serosanguineous Exudate Color: red, brown t Structures Foul Odor After Cleansing: No Slough/Fibrino No Wound Bed Granulation Amount: Large (67-100%) Exposed Structure Granulation Quality: Red, Pink Fascia Exposed: No Necrotic Amount: Small (1-33%) Fat Layer (Subcutaneous Tissue) Exposed: Yes Necrotic Quality: Adherent Slough Tendon Exposed:  No Muscle Exposed: No Joint Exposed: No Bone Exposed: No Periwound Skin Texture Texture Color No Abnormalities Noted: No No Abnormalities Noted: No Callus: No Atrophie Blanche: No Crepitus: No Cyanosis: No Excoriation: No Ecchymosis: No Induration: No Erythema: No Rash: No Hemosiderin Staining: No Scarring: No Mottled: No Pallor: No Moisture Rubor: No No Abnormalities Noted: No Dry / Scaly: No Maceration: No Electronic Signature(s) Signed: 09/30/2022 4:45:41 PM By: [name] Entered By: [name] on 09/30/2022 12:47:35 [name], [name] ([id number]) [id number] Page 6 of 8 -------------------------------------------------------------------------------- Wound Assessment Details Patient Name: Date of Service: [name] 09/30/2022 12:30 PM Medical Record Number: [id number] Patient Account Number: [id number] Date of Birth/Sex: Treating RN: [birth year] (67 y.o. M) Primary Care Emmett Bracknell: [name] Other Clinician: Referring Merwyn Hodapp: Treating Suzetta Timko/Extender: [name] in Treatment: 51 Wound Status Wound Number: 27 Primary Venous Leg Ulcer Etiology: Wound Location: Left, Posterior Lower Leg Wound Open Wounding Event: Gradually Appeared Status: Date Acquired: 09/07/2021 Comorbid Anemia, Sleep Apnea, Congestive Heart Failure, Coronary Artery Weeks Of Treatment: 51 History: Disease, Hypertension, Peripheral Venous Disease, Type II Clustered Wound: No Diabetes, Osteoarthritis, Neuropathy Photos Wound Measurements Length: (cm) 19.5 Width: (cm) 18 Depth: (cm) 0.4 Area: (cm) 275.675 Volume: (cm) 110.27 % Reduction in Area: -6.4% % Reduction in Volume: -41.8% Epithelialization: Small (1-33%) Wound Description Classification: Full Thickness With Exposed Suppo Wound Margin: Distinct, outline attached Exudate Amount: Medium Exudate Type: Serosanguineous Exudate Color: red, brown rt Structures Foul Odor  After Cleansing: No Slough/Fibrino No Wound Bed Granulation Amount: Medium (34-66%) Exposed Structure Granulation Quality: Red, Friable Fascia Exposed: No Necrotic Amount: Medium (34-66%) Fat Layer (Subcutaneous Tissue) Exposed: Yes Necrotic Quality: Eschar, Adherent Slough Tendon Exposed: No Muscle Exposed: No Joint Exposed: No Bone Exposed: No Periwound Skin Texture Texture Color No Abnormalities Noted: No  No Abnormalities Noted: No Callus: No Atrophie Blanche: No Crepitus: No Cyanosis: No Excoriation: No Ecchymosis: No Induration: No Erythema: No Rash: No Hemosiderin Staining: No Scarring: No Mottled: No Pallor: No Moisture Rubor: No No Abnormalities Noted: No Dry / Scaly: No Temperature / Pain Maceration: No Temperature: No Abnormality [name], [name] ([id number]) [id number] Page 7 of 8 Tenderness on Palpation: Yes Electronic Signature(s) Signed: 09/30/2022 4:45:41 PM By: [name] Entered By: [name] on 09/30/2022 12:48:32 -------------------------------------------------------------------------------- Wound Assessment Details Patient Name: Date of Service: [name] 09/30/2022 12:30 PM Medical Record Number: [id number] Patient Account Number: [id number] Date of Birth/Sex: Treating RN: Apr 04, 1956 (67 y.o. M) Primary Care Ada Woodbury: [name] Other Clinician: Referring Treshon Stannard: Treating Westlynn Fifer/Extender: [name] in Treatment: 51 Wound Status Wound Number: 29 Primary Venous Leg Ulcer Etiology: Wound Location: Right, Lateral Lower Leg Wound Open Wounding Event: Gradually Appeared Status: Date Acquired: 04/30/2022 Comorbid Anemia, Sleep Apnea, Congestive Heart Failure, Coronary Artery Weeks Of Treatment: 21 History: Disease, Hypertension, Peripheral Venous Disease, Type II Clustered Wound: No Diabetes, Osteoarthritis, Neuropathy Photos Wound Measurements Length: (cm) 1 Width: (cm)  0.9 Depth: (cm) 0.2 Area: (cm) 0.707 Volume: (cm) 0.141 % Reduction in Area: -16.9% % Reduction in Volume: -16.5% Epithelialization: None Wound Description Classification: Full Thickness With Exposed Suppo Wound Margin: Distinct, outline attached Exudate Amount: Medium Exudate Type: Serosanguineous Exudate Color: red, brown rt Structures Foul Odor After Cleansing: No Slough/Fibrino Yes Wound Bed Granulation Amount: Medium (34-66%) Exposed Structure Granulation Quality: Red, Pink Fascia Exposed: No Necrotic Amount: Medium (34-66%) Fat Layer (Subcutaneous Tissue) Exposed: Yes Necrotic Quality: Adherent Slough Tendon Exposed: No Muscle Exposed: No Joint Exposed: No Bone Exposed: No Periwound Skin Texture Texture Color No Abnormalities Noted: No No Abnormalities Noted: No Callus: No Atrophie Blanche: No [name], [name] ([id number]) [id number] Page 8 of 8 Crepitus: No Cyanosis: No Excoriation: No Ecchymosis: No Induration: No Erythema: No Rash: No Hemosiderin Staining: No Scarring: No Mottled: No Pallor: No Moisture Rubor: No No Abnormalities Noted: No Dry / Scaly: No Maceration: No Electronic Signature(s) Signed: 09/30/2022 4:45:41 PM By: [name] Entered By: [name] on 09/30/2022 12:47:56 -------------------------------------------------------------------------------- Vitals Details Patient Name: Date of Service: [name] 09/30/2022 12:30 PM Medical Record Number: [id number] Patient Account Number: [id number] Date of Birth/Sex: Treating RN: 1955/11/23 (67 y.o. M) Primary Care Heydy Montilla: [name] Other Clinician: Referring Valeen Borys: Treating Trinh Sanjose/Extender: [name] in Treatment: 23 Vital Signs Time Taken: 12:37 Temperature (F): 98.5 Height (in): 77 Pulse (bpm): 70 Weight (lbs): 258 Respiratory Rate (breaths/min): 18 Body Mass Index (BMI): 30.6 Blood Pressure (mmHg):  149/70 Reference Range: 80 - 120 mg / dl Electronic Signature(s) Signed: 09/30/2022 4:45:41 PM By: [name] Entered By: [name] on 09/30/2022 12:37:33

## 2022-10-01 NOTE — Progress Notes (Signed)
[name], [name] ([id number]) [id number] Page 1 of 17 Visit Report for 09/30/2022 Chief Complaint Document Details [name], [name] 09/30/2022 12:30 PM Medical Record Number: [id number] Patient Account Number: [id number] Date of Birth/Sex: Treating RN: 1956/01/17 (67 y.o. M) Primary Care Provider: [name] Other Clinician: Referring Provider: Treating Provider/Extender: [name] in Treatment: 75 Information Obtained from: Patient Chief Complaint 10/08/2021; bilateral lower extremity wounds Electronic Signature(s) Signed: 09/30/2022 2:02:55 PM By: [name] DO Entered By: [name] on 09/30/2022 13:09:21 -------------------------------------------------------------------------------- Debridement Details [name], [name] 09/30/2022 12:30 PM Medical Record Number: [id number] Patient Account Number: [id number] Date of Birth/Sex: Treating RN: 12/25/1955 (66 y.o. [name], Meta.Reding Primary Care Provider: [name] Other Clinician: Referring Provider: Treating Provider/Extender: [name] in Treatment: 51 Debridement Performed for Assessment: Wound #25 Right,Medial Lower Leg Performed By: Physician [name], DO Debridement Type: Debridement Severity of Tissue Pre Debridement: Fat layer exposed Level of Consciousness (Pre-procedure): Awake and Alert Pre-procedure Verification/Time Out Yes - 12:55 Taken: Start Time: 12:56 Pain Control: Lidocaine 4% T opical Solution T Area Debrided (L x W): otal 5.5 (cm) x 2.6 (cm) = 14.3 (cm) Tissue and other material debrided: Viable, Non-Viable, Slough, Subcutaneous, Slough Level: Skin/Subcutaneous Tissue Debridement Description: Excisional Instrument: Curette Bleeding: Minimum Hemostasis Achieved: Pressure End Time: 13:06 Procedural Pain: 0 Post Procedural Pain: 0 Response to  Treatment: Procedure was tolerated well Level of Consciousness (Post- Awake and Alert procedure): Post Debridement Measurements of Total Wound Length: (cm) 5.5 Width: (cm) 2.6 Depth: (cm) 0.1 Volume: (cm) 1.123 Character of Wound/Ulcer Post Debridement: Improved Severity of Tissue Post Debridement: Fat layer exposed [name], [name] ([id number]) [id number] Page 2 of 17 Post Procedure Diagnosis Same as Pre-procedure Electronic Signature(s) Signed: 09/30/2022 2:02:55 PM By: [name] DO Signed: 09/30/2022 6:29:44 PM By: [name] RN, BSN Entered By: [name] on 09/30/2022 13:07:18 -------------------------------------------------------------------------------- Debridement Details [name], [name] 09/30/2022 12:30 PM Medical Record Number: [id number] Patient Account Number: [id number] Date of Birth/Sex: Treating RN: 1956/01/03 (67 y.o. [name], Meta.Reding Primary Care Provider: [name] Other Clinician: Referring Provider: Treating Provider/Extender: [name] in Treatment: 51 Debridement Performed for Assessment: Wound #27 Left,Posterior Lower Leg Performed By: Physician [name], DO Debridement Type: Debridement Severity of Tissue Pre Debridement: Fat layer exposed Level of Consciousness (Pre-procedure): Awake and Alert Pre-procedure Verification/Time Out Yes - 12:55 Taken: Start Time: 12:56 Pain Control: Lidocaine 4% T opical Solution T Area Debrided (L x W): otal 19.5 (cm) x 18 (cm) = 351 (cm) Tissue and other material debrided: Viable, Non-Viable, Slough, Subcutaneous, Slough Level: Skin/Subcutaneous Tissue Debridement Description: Excisional Instrument: Curette Bleeding: Minimum Hemostasis Achieved: Pressure End Time: 13:06 Procedural Pain: 0 Post Procedural Pain: 0 Response to Treatment: Procedure was tolerated well Level of Consciousness (Post- Awake and  Alert procedure): Post Debridement Measurements of Total Wound Length: (cm) 19.5 Width: (cm) 18 Depth: (cm) 0.4 Volume: (cm) 110.27 Character of Wound/Ulcer Post Debridement: Improved Severity of Tissue Post Debridement: Fat layer exposed Post Procedure Diagnosis Same as Pre-procedure Electronic Signature(s) Signed: 09/30/2022 2:02:55 PM By: [name] DO Signed: 09/30/2022 6:29:44 PM By: [name] RN, BSN Entered By: [name] on 09/30/2022 13:07:39 Debridement Details -------------------------------------------------------------------------------- [name], [name] ([id number]) [id number] Page 3 of 17 [name], [name] 09/30/2022 12:30 PM Medical Record Number: [id number] Patient Account Number: [id number] Date of Birth/Sex: Treating RN: 11-03-1955 [phone]  y.o. [name], Meta.Reding Primary Care Provider: [name] Other Clinician: Referring Provider: Treating Provider/Extender: [name] in Treatment: 51 Debridement Performed for Assessment: Wound #29 Right,Lateral Lower Leg Performed By: Physician [name], DO Debridement Type: Debridement Severity of Tissue Pre Debridement: Fat layer exposed Level of Consciousness (Pre-procedure): Awake and Alert Pre-procedure Verification/Time Out Yes - 12:55 Taken: Start Time: 12:56 Pain Control: Lidocaine 4% T opical Solution T Area Debrided (L x W): otal 1 (cm) x 0.9 (cm) = 0.9 (cm) Tissue and other material debrided: Viable, Non-Viable, Slough, Subcutaneous, Slough Level: Skin/Subcutaneous Tissue Debridement Description: Excisional Instrument: Curette Bleeding: Minimum Hemostasis Achieved: Pressure End Time: 13:06 Procedural Pain: 0 Post Procedural Pain: 0 Response to Treatment: Procedure was tolerated well Level of Consciousness (Post- Awake and Alert procedure): Post Debridement Measurements of Total Wound Length: (cm) 1 Width:  (cm) 0.9 Depth: (cm) 0.2 Volume: (cm) 0.141 Character of Wound/Ulcer Post Debridement: Improved Severity of Tissue Post Debridement: Fat layer exposed Post Procedure Diagnosis Same as Pre-procedure Electronic Signature(s) Signed: 09/30/2022 2:02:55 PM By: [name] DO Signed: 09/30/2022 6:29:44 PM By: [name] RN, BSN Entered By: [name] on 09/30/2022 13:08:00 -------------------------------------------------------------------------------- HPI Details [name], [name] 09/30/2022 12:30 PM Medical Record Number: [id number] Patient Account Number: [id number] Date of Birth/Sex: Treating RN: 04/06/1956 (67 y.o. M) Primary Care Provider: [name] Other Clinician: Referring Provider: Treating Provider/Extender: [name] in Treatment: 44 History of Present Illness HPI Description: 07/07/15; this is a patient who is in hospital on 8/2 through 8/4. He had cellulitis and abscess of predominantly I think the left leg. He received IV antibiotics. Plain x-ray showed no osteomyelitis. An MRI of the left leg did not show osteomyelitis. Cultures showed no predominant organism. His hemoglobin A1c was 9.8. He has a history of venous stasis also peripheral vascular disease. He was discharged to [place] home. He really has extensive ulcerations on the left lateral leg including a major wound that communicates both posteriorly and superiorly w. When drainage coming out of 2 smaller areas. He has a smaller wound on the posterior medial left leg. He has more predominantly venous insufficiency wounds on predominantly the right medial leg above the ankle the right foot into sections. He has recently been put on Augmentin at the nursing home. Previous ABIs/arterial evaluation showed triphasic waves diffusely he does not have a major ischemic issue a left lower extremity venous duplex also exam showed no evidence of a DVT on  6/21 07/21/15; the patient arrives with really no major change. Culture I did last time was negative. He has not had a follow-up MRI I ordered. The wounds are macerated covered with a thick gelatinous surface slough. There is necrotic subcutaneous tissue 08/04/15; the patient arrives with a considerable improvement in the majority of his wound area. The area on the left leg now has what looks to be a granulated base. Most of the wounds on the left leg required an aggressive surgical debridement to remove nonviable fibrinous eschar and subcutaneous tissue however after debridement most of this looks better. Although I had asked for repeat MRI of the left leg when he first came in here with grossly purulent material [name], [name] ([id number]) [id number] Page 4 of 17 coming out of his wounds, it does not appear that this is been done and nor do I actually feel that strongly about it right now. He has severe surrounding venous insufficiency and inflammation. I don't  believe he has significant PAD 08/25/15. In general there is still a considerable wound area here but with still extensive service adherent slough. The patient will not allow mechanical debridement due to pain. He did not tolerate Medihoney therefore we are left with Santyl for now. She would appear that he has severe surrounding venous stasis. There is no evidence of the infection that may have had something to do with the pathogenesis of these wounds 09/29/15; patient still has substantial wound area on the left leg with a cluster of several wounds on the lateral leg confluently on the left leg posteriorly and then a substantial wound on the medial leg. On the right leg a substantial wound medially and a small area on the right lateral foot. All of these underwent a substantial surgical debridement with curettes which she tolerated better than he has in the past. This started as a complex cellulitis in the face of  chronic venous insufficiency and inflammation. 10/13/15; substantial cluster of wounds on the lateral aspect of his left leg confluently around to the other side. Extensive surgical debridement to remove for redness surface slough nonviable subcutaneous tissue. This is not an improvement. Also substantial wound on the medial right leg which is largely unchanged. The etiology of this was felt to be a complex cellulitis in the summer of 2016 in the face of chronic venous insufficiency and inflammation. The patient states that the wound on the right medial leg has been there for years off and on. 10/20/15; we are able to start [facility] to these extensive wound areas left greater than right on Tuesday after I spoke to the wound care nurse at the facility. He arrives here today for extensive surgical debridement for the wounds on the lateral aspect of his left leg posterior left leg, this is almost circumferential. He has a large wound on the medial aspect of his right leg although he finds this too painful for debridement 10/27/15; I continue to bring this patient back for frequent debridement/weekly debridement severe. We have been using Hydrofera Blue. Unfortunately this has not really had any improvement. The debridement surgery difficult and painful for the patient 11/23/15; this patient spent a complex hospitalization admitted with acute kidney injury, anemia, cellulitis of the lower extremity, he was felt to have sepsis pathophysiology although his blood cultures were negative. He had plain x-rays of both legs that were negative for osseous abnormalities. He was seen by infectious disease and placed on a workup for vasculitis that was negative including a biopsy 4 all were consistent with stasis dermatitis. Vital broad- spectrum antibiotics he continued to spike fevers. Urine and chest x-ray were negative Dopplers on admission ruled out a DVT All antibiotics were stopped on . 2/17. Since his return  to [facility] skilled nursing facility I believe they have been using Xeroform 12/07/15 the patient arrives today with the area on his right medial leg actually looking quite stable. No debridement. The rest of his extensive wounds on the left lateral left posterior extending into the left medial leg all required extensive debridement. I think this is probably going to need to and up in the hands of plastic surgery. We'll attempt to change him back to [facility]. Arrange consultation with plastic surgery at [facility] for this almost circumferential wound on the left side 12/21/15 right leg covered in surface slough. This was debridement. Left leg extensive wounds all carefully examined. This is almost circumferential on the left side especially on the posterior calf. No  debridement is necessary. 01/04/16 the patient has been to [facility] plastic surgery. Unfortunately it doesn't look like they had any of the prior workup on this patient. They're in the middle of vascular workup. It sounds as though they're applying Hydrofera Blue at the nursing home. 01/22/16; the patient has been back to see [facility]. There apparently making plans to possibly do skin grafts over his large venous insufficiency ulcerations. The patient tells me that he goes to hematology in [facility] and variably uses the term platelets red cells and the description of his problem. 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 I do not see any major difference in the wound on the right medial leg and circumferentially across the left medial to left lateral leg. Did not attempt to debride these today. Readmission: 05/05/18 on evaluation today patient presents for reevaluation here in our clinic although I have previously seen him in the skilled nursing facility over the past year and a half when I was working in the skilled nursing facility realm instead of covering in the clinic. With that being  said during that time we had initiated most recently dressings with Hydrofera Blue Dressing along with the [employer] which had done very well for him. Much better than the Kerlex Coban wraps. With that being said the patient had been doing fairly well and was making progress when I last saw him. Upon evaluation today it appears that the wounds are actually a little bit worse than when I last saw him although definitely not dramatically so. There does not appear to be any evidence of infection which is good news. With that being said he has been tolerating the wraps without complication his left hip still gives him a lot of trouble which is his main issue as far as movement is concerned. Unbeknownst to me he has not had venous studies that I can find. He previously told me he had there were arterial studies noted back from 04/27/15 which showed that he had try phasic blood flow throughout and again his test appear to be completely normal as performed by Dr. Creig Hines. With that being said I cannot find where he had venous studies. The patient still states he thinks he did these definitely had no venous intervention at this point. Upon evaluation today it does not appear to me that the patient has any evidence of cellulitis. He does have some chronic venous insufficiency which I think has led to stasis dermatitis but again this is not appearing to be infected at this point. No fevers, chills, nausea, or vomiting noted at this time. READMISSION 06/30/2018 This is a now 67 year old man we have had in this clinic at multiple times in the past. Most recently he came in August and was seen by [facility] stone. It is not clear why he did not come back we asked him really did not get a straight answer. He is at [facility] skilled facility he is a man who has severe chronic venous insufficiency with lymphedema and has had severe wounds on his left greater than right leg. We had him here in 2016 and 17 ultimately  referred him to [facility]. He had arterial studies and venous studies done at [facility] although I am not able to access these records I see they are actually done. He also had multiple biopsies that were negative for malignancy showing changes compatible with chronic inflammation. As I understand things in [place]  [place] they are using Iodoflex and Unna boots. I am not really sure how they are getting enough Iodoflex for the large wound area especially on his left calf. Nevertheless the wounds look better than when I saw this in 2017. There were plans for him to have plastic surgery in 2018 and I think he was actually prepped for surgery however he was ultimately denied because the patient was an active smoker. The patient is not systemically unwell. The wounds are painful however given the size especially on the left this is not surprising. ABIs in this clinic were 0.78 on the left and 0.91 on the right 07/13/2018; this is a patient with bilateral severe venous ulcers with secondary lymphedema. The wounds are on the right medial calf and a substantial part of the left posterior calf and some involvement medially and laterally on the left. We changed him to silver alginate under Unna boot's last time. The wounds actually look fairly satisfactory today. 08/14/2018; this is a patient with severe bilateral venous stasis ulcers with secondary lymphedema left greater than right he is at [facility] using silver alginate under Unna boot's I do not think there is much change on this visit versus last time I saw him a month ago Readmission: 11/04/18 patient presents today for follow-up concerning his bilateral lower extremity ulcers. His a right medial ankle ulcer and a left leg that has ulcers over a large proportion of the surface area between the ankle and knee. Unfortunately this does cause some discomfort for the patient although it doesn't seem to be as uncomfortable as it has been in the past. He is  seen today for evaluation after a referral by [place] back to the clinic. Unfortunately has a lot of Slough noted on the surface of his wounds. He's been treated currently it sounds like with Dakin's soaked gauze and they have not been performing the Unna Boot wrap that was previously recommended. I'm not exactly sure what the reason for the change was. He does have less slough on the surface of the wound but again this is something that is often a constant issue for him. Again he's had these wounds for many years. I've known him for two years at least during that time when I was taking care of them in the facility he is Artie had the wounds for several years prior. READMISSION 02/25/2020 [name] is a now 67 year old man. He has been in our clinic several times before most extensively in October 2016 through May 2017. At that time he had absolutely substantial bilateral lower extremity wounds secondary to chronic venous insufficiency and lymphedema. We ultimately referred him to [facility] he had a series of biopsies only showed suggestion of wound secondary to chronic venous insufficiency. He had a less extensive stay in our clinic in 2019 and then a single visit in February 2020. He is a resident at [facility] skilled facility. I think he has had intermittently had in facility wound care. He returns today. They are using silver alginate on the wounds although I am not sure what type of compression. Previously he has favored Unna boots. He is not felt to have an arterial issue. Past medical history; lymphedema and chronic venous insufficiency, diabetes mellitus, hypertension, congestive heart failure Kray, Zayvion ([id number]) [id number] Page 5 of 17 We did not do arterial studies today 04/27/2020 on evaluation patient appears to be doing really about the same as when I have known him and seen him in the past. He  does have wounds on the right and left legs at this point.  Fortunately there is no signs of active infection at this time. 9/2; 1 month follow-up. This is a man with severe chronic venous insufficiency and lymphedema. When I first met him he had horrible circumferential bilateral lower extremity ulcers. We have been using silver alginate up until early last month when it was changed to [facility] and Unna boot's more or less says maintenance dressings. Since the last time he was here the facility where he resides [facility] called to report they could no longer do Unna boot so he is apparently only been receiving kerlix Coban the left leg is certainly a lot worse with almost circumferential large wounds especially lateral but now medial and posterior as well. He has a standard same looking wound on the right medial lower leg. 10/1; absolutely no improvement here. In fact I do not think anything much is changed. The substantial area on his left lateral and left posterior calf is still open may be slightly larger. He has a more modest wound on the right medial calf. None of these look any different. He comes in with a kerlix Coban wrap this will simply not be adequate. He has too much edema in this leg He also is complaining a lot of pain in his left heel area. 11/8; potential wounds on the left lateral and posterior calf and a smaller oval wound in the right medial. We have been using Hydrofera Blue under 4-layer compression. He was using Unna boots still 2 months ago the facility called to say they can no longer place these so we have been using 4-layer compression. The surface area of the area on the left is so large there is very few alternatives to what we can use on this either silver alginate or Hydrofera Blue. He was complaining of pain on the left heel last time I asked for an x-ray this was apparently done although we do not have the result. He is not complaining of pain today. 08/16/2020 on evaluation today patient is is seen for a early visit  due to the fact that he apparently is having issues I was told when they called on Monday with a MRSA infection that they felt like we needed to see him for because his legs "looked horrible". With that being said based on what I see at this point it does not appear that the patient is actually having a terrible infection in fact compared to last time I saw him his legs do not appear to be doing too poorly at all at this time. Nonetheless he has been on doxycycline that is not going to be a good option for him based on the culture report that I have for review today which graded as a partial report with still several organisms pending as far as identification is concerned we did contact them but again they do not have the final report yet. Either way based on what we see it appears that doxycycline is one of the few medications that will not work for the Citrobacter. Obviously I think that a different medication would be a good option for him since there is a gram-negative organism pending I am likely can I suggest Cipro as the best of backup antibiotic to switch him to at this point. All this was discussed with the patient today. 12/20; not much change in the substantial wound on the left posterior calf and the small area on  the right medial. He has a new area on the left anterior which looks like a wrap injury. Had some thoughts about doing a deep tissue culture here although we will put that off for next time. Using silver alginate as a primary dressing 2/10; substantial area on the left posterior calf. This measures smaller but still is a very large area. He has the area on the right anterior medial which also is a fairly large wound but much smaller than not on the left. His edema control is quite good. We have been using silver alginate because of the size of the wounds. 4/7; substantial wound on the left posterior calf and a smaller area on the right medial lower leg. These are very chronic  wounds which we have classified is venous. Previously worked up at [facility] for 5 years ago at which time I had sent and will send him over for consideration of extensive skin graft I know they biopsied this many times but he never had plastic surgery. The wound area is much too large for standard size dressings we have been using silver alginate but we did give him a good trial last fall of Hydrofera Blue that did not make any difference either. If anything the area on the right medial lower leg over time has gotten a lot smaller as has the area on the left although it still quite substantial now. Readmission 10/08/2021 Mr. Damontae Loppnow is a 67 year old male with a past medical history of insulin-dependent type 2 diabetes with last hemoglobin A1c of 10.2, chronic venous insufficiency, lymphedema and chronic diastolic heart failure that presents to the clinic for bilateral lower extremity wounds. He has been followed in our clinic previously for the past 6 years for these issues. He was last seen in April 2022. He is inconsistent with his appointments in our clinic. These wounds have not healed over the past few years. He is currently using silver alginate to the wound beds and keeping these covered with Kerlix. He currently denies systemic signs of infection. He was recently hospitalized for septic shock secondary to bacteremia from likely chronic leg wounds And is currently on oral antibiotics to be completed this week. 1/30; patient presents for follow-up. He has no issues or complaints today. 2/6; patient presents for follow-up. He reports more discomfort with the increase in compression wraps. He currently denies systemic signs of infection and feels well overall. 2/13; patient presents for follow-up. He reports tolerating the current leg/Coban wrap well. He denies systemic signs of infection and feels fine overall. 2/21; patient presents for follow-up. He has no issues or complaints today. He  tolerated the kirlex/Coban wrap. He denies systemic signs of infection. 3/21; patient presents for follow-up. He has no issues or complaints today. He saw infectious disease, Dr. Megan Salon. Nothing further to do from an infectious disease point. Levaquin was stopped. He currently denies systemic signs of infection. 4/4; patient presents for follow-up. His facility was able to obtain the [facility] antibiotic and has started using this with dressing changes/wrap changes. Patient has no issues or complaints today. He denies signs of infection. 4/18; patient presents for follow-up. He has no issues or complaints today. He denies signs of infection. 5/9; patient presents for follow-up. He has been using [facility] antibiotic under Kerlix/Coban. He denies signs of infection today. 6/6; monthly follow-up. Patient is at [facility] skilled facility. He has been using [facility] probably silver alginate under kerlix Coban. We use Sorbact here. Wound condition has improved 7/11; patient  presents for follow-up. We have been using [facility] with Sorbact under Kerlix/Coban. Patient has no issues or complaints today. 8/8; patient presents for follow-up. He is still using [facility] and Sorbact under Kerlix/Coban to his lower extremities bilaterally. He has no issues or complaints today. He denies signs of infection. 9/12; patient presents for follow-up. We have been using [facility] with Sorbact and calcium alginate under Kerlix/Coban to lower extremities bilaterally. He has no issues or complaints today. 10/10; patient presents for follow-up. We continue to use [facility] with Sorbact and calcium alginate under Kerlix/Coban. He has tolerated this well and he has no issues or complaints today. 11/14; patient presents for follow-up. We have been using [facility] with Sorbact and calcium alginate under Kerlix/Coban to the lower extremities bilaterally. He cannot tolerate higher compression. He states that the facility he resides  and changes his dressings 3 times a week. He has no issues or complaints today. 12/12; patient presents for follow-up. We have been using [facility] antibiotic ointment with Sorbact and calcium alginate under Kerlix/Coban to the lower extremities bilaterally. He is open to trying a higher level compression. His ABIs do not suggest significant arterial insufficiency. 1/8; patient presents for follow up. We have been using [facility] antibiotic ointment with Sorbact and calcium alginate. We increased the compression to 3 [name], [name] ([id number]) [id number] Page 6 of 17 layer compression last visit however patient came in with Kerlix/Coban and no sorbact. It is unclear if the patient's facility has been using a higher level compression. We will follow-up with his facility. Electronic Signature(s) Signed: 09/30/2022 2:02:55 PM By: [name] DO Entered By: [name] on 09/30/2022 13:15:01 -------------------------------------------------------------------------------- Physical Exam Details [name], [name] 09/30/2022 12:30 PM Medical Record Number: [id number] Patient Account Number: [id number] Date of Birth/Sex: Treating RN: Mar 05, 1956 (67 y.o. M) Primary Care Provider: [name] Other Clinician: Referring Provider: Treating Provider/Extender: [name] in Treatment: 18 Constitutional respirations regular, non-labored and within target range for patient.. Cardiovascular 2+ dorsalis pedis/posterior tibialis pulses. Psychiatric pleasant and cooperative. Notes Bilateral lower extremity wounds with granulation tissue and nonviable tissue, left > right. No signs of surrounding infection. Evidence of lymphedema skin changes bilaterally to the lower extremities. Electronic Signature(s) Signed: 09/30/2022 2:02:55 PM By: [name] DO Entered By: [name] on 09/30/2022  13:15:33 -------------------------------------------------------------------------------- Physician Orders Details [name], [name] 09/30/2022 12:30 PM Medical Record Number: [id number] Patient Account Number: [id number] Date of Birth/Sex: Treating RN: September 01, 1956 (67 y.o. [name] Primary Care Provider: [name] Other Clinician: Referring Provider: Treating Provider/Extender: [name] in Treatment: 39 Verbal / Phone Orders: No Diagnosis Coding ICD-10 Coding Code Description [phone] Chronic venous hypertension (idiopathic) with ulcer and inflammation of bilateral lower extremity 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ed L97.912 Non-pressure chronic ulcer of unspecified part of right lower leg with fat layer exposed I89.0 Lymphedema, not elsewhere classified E44.0 Moderate protein-calorie malnutrition E11.622 Type 2 diabetes mellitus with other skin ulcer H47.65 Chronic diastolic (congestive) heart failure Follow-up Appointments Redmond, Nelvin ([id number]) [id number] Page 7 of 17 Return appointment in 1 month. - 4 weeks Dr. Heber Collyer NEED STRETCHER ROOM ****EXTRA TIME 31 MINS. HOYEr/HIGH ACUITY*** No appt.'s after 11:00 and after 3:00!!**** ***ROOM 9 1230 10/28/2022.**** ****PLEASE WRAP LEGS BEGINNING AT TOES AND GOING TO BEND OF KNEE*** Other: - Pt. to bring in [place] to every [facility] appt. please!!:) ****FACILITY TO CONTINUE TO  USE THE TOPICAL ANTIBIOTIC SPRAY FROM [facility]-APPLY DIRECTLY TO WOUND BED.**** ****FACILITY TO USE CUTIMED SORBACT SWABS UNDER THE ALGINATE.**** ****FACILITY TO APPLY COMPRESSION WRAPS FROM BASE OF TOES TO JUST BELOW KNEE.***** Anesthetic (In clinic) Topical Lidocaine 4% applied to wound bed Bathing/ Shower/ Hygiene May shower and wash wound with soap and water. - when changing dressing Edema Control -  Lymphedema / SCD / Other Moisturize legs daily. - with dressing changes Additional Orders / Instructions Follow Nutritious Diet - Monitor/Control Blood Sugars-Continue using ProStat Other: - Go to emergency room for fever, chills, strong odor from wounds, increased pain in wounds Wound Treatment Wound #25 - Lower Leg Wound Laterality: Right, Medial Cleanser: Soap and Water 3 x Per Week/30 Days Discharge Instructions: May shower and wash wound with dial antibacterial soap and water prior to dressing change. Cleanser: Wound Cleanser 3 x Per Week/30 Days Discharge Instructions: Cleanse the wound with wound cleanser prior to applying a clean dressing using gauze sponges, not tissue or cotton balls. Peri-Wound Care: Sween Lotion (Moisturizing lotion) 3 x Per Week/30 Days Discharge Instructions: Apply moisturizing lotion as directed Prim Dressing: Cutimed Sorbact Swab 3 x Per Week/30 Days ary Discharge Instructions: Apply to wound bed as instructed Prim Dressing: Maxorb Extra Calcium Alginate Dressing, 4x4 in 3 x Per Week/30 Days ary Discharge Instructions: Apply calcium alginate over the cutimed to help absorb drainage. Prim Dressing: [facility] Antibiotic spray ary 3 x Per Week/30 Days Secondary Dressing: ABD Pad, 8x10 3 x Per Week/30 Days Discharge Instructions: Apply over primary dressing as directed. Compression Wrap: Kerlix Roll 4.5x3.1 (in/yd) 3 x Per Week/30 Days Discharge Instructions: Apply Kerlix and Coban compression as directed. Compression Wrap: Coban Self-Adherent Wrap 4x5 (in/yd) 3 x Per Week/30 Days Discharge Instructions: Apply over Kerlix as directed. Wound #27 - Lower Leg Wound Laterality: Left, Posterior Cleanser: Soap and Water 3 x Per Week/30 Days Discharge Instructions: May shower and wash wound with dial antibacterial soap and water prior to dressing change. Cleanser: Wound Cleanser 3 x Per Week/30 Days Discharge Instructions: Cleanse the wound with wound cleanser  prior to applying a clean dressing using gauze sponges, not tissue or cotton balls. Peri-Wound Care: Sween Lotion (Moisturizing lotion) 3 x Per Week/30 Days Discharge Instructions: Apply moisturizing lotion as directed Prim Dressing: Cutimed Sorbact Swab 3 x Per Week/30 Days ary Discharge Instructions: Apply to wound bed as instructed Prim Dressing: Maxorb Extra Calcium Alginate Dressing, 4x4 in 3 x Per Week/30 Days ary Discharge Instructions: Apply calcium alginate over the cutimed to help absorb drainage. Prim Dressing: [facility] Antibiotic spray ary 3 x Per Week/30 Days Secondary Dressing: ABD Pad, 8x10 3 x Per Week/30 Days Discharge Instructions: Apply over primary dressing as directed. Compression Wrap: Kerlix Roll 4.5x3.1 (in/yd) 3 x Per Week/30 Days Discharge Instructions: Apply Kerlix and Coban compression as directed. Compression Wrap: Coban Self-Adherent Wrap 4x5 (in/yd) 3 x Per Week/30 Days Discharge Instructions: Apply over Kerlix as directed. [name], [name] ([id number]) [id number] Page 8 of 17 Wound #29 - Lower Leg Wound Laterality: Right, Lateral Cleanser: Soap and Water 3 x Per Week/30 Days Discharge Instructions: May shower and wash wound with dial antibacterial soap and water prior to dressing change. Cleanser: Wound Cleanser 3 x Per Week/30 Days Discharge Instructions: Cleanse the wound with wound cleanser prior to applying a clean dressing using gauze sponges, not tissue or cotton balls. Peri-Wound Care: Sween Lotion (Moisturizing lotion) 3 x Per Week/30 Days Discharge Instructions: Apply moisturizing lotion as directed Prim Dressing: Cutimed Sorbact Swab 3  x Per Week/30 Days ary Discharge Instructions: Apply to wound bed as instructed Prim Dressing: Maxorb Extra Calcium Alginate Dressing, 4x4 in 3 x Per Week/30 Days ary Discharge Instructions: Apply calcium alginate over the cutimed to help absorb drainage. Prim Dressing: [facility] Antibiotic  spray ary 3 x Per Week/30 Days Secondary Dressing: ABD Pad, 8x10 3 x Per Week/30 Days Discharge Instructions: Apply over primary dressing as directed. Compression Wrap: Kerlix Roll 4.5x3.1 (in/yd) 3 x Per Week/30 Days Discharge Instructions: Apply Kerlix and Coban compression as directed. Compression Wrap: Coban Self-Adherent Wrap 4x5 (in/yd) 3 x Per Week/30 Days Discharge Instructions: Apply over Kerlix as directed. Electronic Signature(s) Signed: 09/30/2022 2:02:55 PM By: [name] DO Entered By: [name] on 09/30/2022 13:15:43 -------------------------------------------------------------------------------- Problem List Details [name], [name] 09/30/2022 12:30 PM Medical Record Number: [id number] Patient Account Number: [id number] Date of Birth/Sex: Treating RN: 02-25-1956 (67 y.o. M) Primary Care Provider: [name] Other Clinician: Referring Provider: Treating Provider/Extender: [name] in Treatment: 72 Active Problems ICD-10 Encounter Code Description Active Date MDM Diagnosis I87.333 Chronic venous hypertension (idiopathic) with ulcer and inflammation of 10/08/2021 No Yes bilateral lower extremity 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ed1/16/2023 No Yes L97.912 Non-pressure chronic ulcer of unspecified part of right lower leg with fat layer 10/08/2021 No Yes exposed I89.0 Lymphedema, not elsewhere classified 10/08/2021 No Yes E44.0 Moderate protein-calorie malnutrition 06/04/2022 No Yes [name], [name] ([id number]) [id number] Page 9 of [phone] Type 2 diabetes mellitus with other skin ulcer 06/04/2022 No Yes A35.57 Chronic diastolic (congestive) heart failure 06/04/2022 No Yes Inactive Problems Resolved Problems Electronic Signature(s) Signed: 09/30/2022 2:02:55 PM By: [name] DO Entered By: [name] on  09/30/2022 13:08:23 -------------------------------------------------------------------------------- Progress Note Details [name], [name] 09/30/2022 12:30 PM Medical Record Number: [id number] Patient Account Number: [id number] Date of Birth/Sex: Treating RN: Apr 02, 1956 (67 y.o. M) Primary Care Provider: [name] Other Clinician: Referring Provider: Treating Provider/Extender: [name] in Treatment: 14 Subjective Chief Complaint Information obtained from Patient 10/08/2021; bilateral lower extremity wounds History of Present Illness (HPI) 07/07/15; this is a patient who is in hospital on 8/2 through 8/4. He had cellulitis and abscess of predominantly I think the left leg. He received IV antibiotics. Plain x-ray showed no osteomyelitis. An MRI of the left leg did not show osteomyelitis. Cultures showed no predominant organism. His hemoglobin A1c was 9.8. He has a history of venous stasis also peripheral vascular disease. He was discharged to [facility]. He really has extensive ulcerations on the left lateral leg including a major wound that communicates both posteriorly and superiorly w. When drainage coming out of 2 smaller areas. He has a smaller wound on the posterior medial left leg. He has more predominantly venous insufficiency wounds on predominantly the right medial leg above the ankle the right foot into sections. He has recently been put on Augmentin at the nursing home. Previous ABIs/arterial evaluation showed triphasic waves diffusely he does not have a major ischemic issue a left lower extremity venous duplex also exam showed no evidence of a DVT on 6/21 07/21/15; the patient arrives with really no major change. Culture I did last time was negative. He has not had a follow-up MRI I ordered. The wounds are macerated covered with a thick gelatinous surface slough. There is necrotic subcutaneous  tissue 08/04/15; the patient arrives with a considerable improvement  in the majority of his wound area. The area on the left leg now has what looks to be a granulated base. Most of the wounds on the left leg required an aggressive surgical debridement to remove nonviable fibrinous eschar and subcutaneous tissue however after debridement most of this looks better. Although I had asked for repeat MRI of the left leg when he first came in here with grossly purulent material coming out of his wounds, it does not appear that this is been done and nor do I actually feel that strongly about it right now. He has severe surrounding venous insufficiency and inflammation. I don't believe he has significant PAD 08/25/15. In general there is still a considerable wound area here but with still extensive service adherent slough. The patient will not allow mechanical debridement due to pain. He did not tolerate Medihoney therefore we are left with Santyl for now. She would appear that he has severe surrounding venous stasis. There is no evidence of the infection that may have had something to do with the pathogenesis of these wounds 09/29/15; patient still has substantial wound area on the left leg with a cluster of several wounds on the lateral leg confluently on the left leg posteriorly and then a substantial wound on the medial leg. On the right leg a substantial wound medially and a small area on the right lateral foot. All of these underwent a substantial surgical debridement with curettes which she tolerated better than he has in the past. This started as a complex cellulitis in the face of chronic venous insufficiency and inflammation. 10/13/15; substantial cluster of wounds on the lateral aspect of his left leg confluently around to the other side. Extensive surgical debridement to remove for redness surface slough nonviable subcutaneous tissue. This is not an improvement. Also substantial wound on the medial  right leg which is largely unchanged. The etiology of this was felt to be a complex cellulitis in the summer of 2016 in the face of chronic venous insufficiency and inflammation. The patient states that the wound on the right medial leg has been there for years off and on. 10/20/15; we are able to start [facility] to these extensive wound areas left greater than right on Tuesday after I spoke to the wound care nurse at the facility. He arrives here today for extensive surgical debridement for the wounds on the lateral aspect of his left leg posterior left leg, this is almost circumferential. He has a large wound on the medial aspect of his right leg although he finds this too painful for debridement 10/27/15; I continue to bring this patient back for frequent debridement/weekly debridement severe. We have been using Hydrofera Blue. Unfortunately this has not really had any improvement. The debridement surgery difficult and painful for the patient 11/23/15; this patient spent a complex hospitalization admitted with acute kidney injury, anemia, cellulitis of the lower extremity, he was felt to have sepsis pathophysiology although his blood cultures were negative. He had plain x-rays of both legs that were negative for osseous abnormalities. He was seen by infectious disease and placed on a workup for vasculitis that was negative including a biopsy o4 all were consistent with stasis dermatitis. Vital broad-spectrum antibiotics he continued to spike fevers. Urine and chest x-ray were negative Dopplers on admission ruled out a DVT All antibiotics were stopped on 2/17. . Since his return to [facility] skilled nursing facility I believe they have been using Xeroform 12/07/15 the patient arrives today with the area  on his right medial leg actually looking quite stable. No debridement. The rest of his extensive wounds on the left lateral left posterior extending into the left medial leg all required extensive  debridement. I think this is probably going to need to and up in the hands of plastic surgery. We'll attempt to change him back to [facility]. Arrange consultation with plastic surgery at [facility] for this almost circumferential wound on the left side Larmon, Jabez ([id number]) [id number] Page 10 of 17 12/21/15 right leg covered in surface slough. This was debridement. Left leg extensive wounds all carefully examined. This is almost circumferential on the left side especially on the posterior calf. No debridement is necessary. 01/04/16 the patient has been to [facility] plastic surgery. Unfortunately it doesn't look like they had any of the prior workup on this patient. They're in the middle of vascular workup. It sounds as though they're applying Hydrofera Blue at the nursing home. 01/22/16; the patient has been back to see [facility]. There apparently making plans to possibly do skin grafts over his large venous insufficiency ulcerations. The patient tells me that he goes to hematology in [facility] and variably uses the term platelets red cells and the description of his problem. 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 I do not see any major difference in the wound on the right medial leg and circumferentially across the left medial to left lateral leg. Did not attempt to debride these today. Readmission: 05/05/18 on evaluation today patient presents for reevaluation here in our clinic although I have previously seen him in the skilled nursing facility over the past year and a half when I was working in the skilled nursing facility realm instead of covering in the clinic. With that being said during that time we had initiated most recently dressings with Hydrofera Blue Dressing along with the [employer] which had done very well for him. Much better than the Kerlex Coban wraps. With that being said the patient had been doing  fairly well and was making progress when I last saw him. Upon evaluation today it appears that the wounds are actually a little bit worse than when I last saw him although definitely not dramatically so. There does not appear to be any evidence of infection which is good news. With that being said he has been tolerating the wraps without complication his left hip still gives him a lot of trouble which is his main issue as far as movement is concerned. Unbeknownst to me he has not had venous studies that I can find. He previously told me he had there were arterial studies noted back from 04/27/15 which showed that he had try phasic blood flow throughout and again his test appear to be completely normal as performed by Dr. Creig Hines. With that being said I cannot find where he had venous studies. The patient still states he thinks he did these definitely had no venous intervention at this point. Upon evaluation today it does not appear to me that the patient has any evidence of cellulitis. He does have some chronic venous insufficiency which I think has led to stasis dermatitis but again this is not appearing to be infected at this point. No fevers, chills, nausea, or vomiting noted at this time. READMISSION 06/30/2018 This is a now 67 year old man we have had in this clinic at multiple times in the past. Most recently he came in August and was seen by  Hoyt stone. It is not clear why he did not come back we asked him really did not get a straight answer. He is at [facility] skilled facility he is a man who has severe chronic venous insufficiency with lymphedema and has had severe wounds on his left greater than right leg. We had him here in 2016 and 17 ultimately referred him to [facility]. He had arterial studies and venous studies done at [facility] although I am not able to access these records I see they are actually done. He also had multiple biopsies that were negative for malignancy showing  changes compatible with chronic inflammation. As I understand things in [place] they are using Iodoflex and Unna boots. I am not really sure how they are getting enough Iodoflex for the large wound area especially on his left calf. Nevertheless the wounds look better than when I saw this in 2017. There were plans for him to have plastic surgery in 2018 and I think he was actually prepped for surgery however he was ultimately denied because the patient was an active smoker. The patient is not systemically unwell. The wounds are painful however given the size especially on the left this is not surprising. ABIs in this clinic were 0.78 on the left and 0.91 on the right 07/13/2018; this is a patient with bilateral severe venous ulcers with secondary lymphedema. The wounds are on the right medial calf and a substantial part of the left posterior calf and some involvement medially and laterally on the left. We changed him to silver alginate under Unna boot's last time. The wounds actually look fairly satisfactory today. 08/14/2018; this is a patient with severe bilateral venous stasis ulcers with secondary lymphedema left greater than right he is at [facility] using silver alginate under Unna boot's I do not think there is much change on this visit versus last time I saw him a month ago Readmission: 11/04/18 patient presents today for follow-up concerning his bilateral lower extremity ulcers. His a right medial ankle ulcer and a left leg that has ulcers over a large proportion of the surface area between the ankle and knee. Unfortunately this does cause some discomfort for the patient although it doesn't seem to be as uncomfortable as it has been in the past. He is seen today for evaluation after a referral by [place] back to the clinic. Unfortunately has a lot of Slough noted on the surface of his wounds. He's been treated currently it sounds like with Dakin's soaked gauze and they have not been  performing the Unna Boot wrap that was previously recommended. I'm not exactly sure what the reason for the change was. He does have less slough on the surface of the wound but again this is something that is often a constant issue for him. Again he's had these wounds for many years. I've known him for two years at least during that time when I was taking care of them in the facility he is Artie had the wounds for several years prior. READMISSION 02/25/2020 [name] is a now 67 year old man. He has been in our clinic several times before most extensively in October 2016 through May 2017. At that time he had absolutely substantial bilateral lower extremity wounds secondary to chronic venous insufficiency and lymphedema. We ultimately referred him to [facility] he had a series of biopsies only showed suggestion of wound secondary to chronic venous insufficiency. He had a less extensive stay in our clinic in 2019 and then  a single visit in February 2020. He is a resident at [facility] skilled facility. I think he has had intermittently had in facility wound care. He returns today. They are using silver alginate on the wounds although I am not sure what type of compression. Previously he has favored Unna boots. He is not felt to have an arterial issue. Past medical history; lymphedema and chronic venous insufficiency, diabetes mellitus, hypertension, congestive heart failure We did not do arterial studies today 04/27/2020 on evaluation patient appears to be doing really about the same as when I have known him and seen him in the past. He does have wounds on the right and left legs at this point. Fortunately there is no signs of active infection at this time. 9/2; 1 month follow-up. This is a man with severe chronic venous insufficiency and lymphedema. When I first met him he had horrible circumferential bilateral lower extremity ulcers. We have been using silver alginate up until early last month when it  was changed to [facility] and Unna boot's more or less says maintenance dressings. Since the last time he was here the facility where he resides [facility] called to report they could no longer do Unna boot so he is apparently only been receiving kerlix Coban the left leg is certainly a lot worse with almost circumferential large wounds especially lateral but now medial and posterior as well. He has a standard same looking wound on the right medial lower leg. 10/1; absolutely no improvement here. In fact I do not think anything much is changed. The substantial area on his left lateral and left posterior calf is still open may be slightly larger. He has a more modest wound on the right medial calf. None of these look any different. He comes in with a kerlix Coban wrap this will simply not be adequate. He has too much edema in this leg He also is complaining a lot of pain in his left heel area. 11/8; potential wounds on the left lateral and posterior calf and a smaller oval wound in the right medial. We have been using Hydrofera Blue under 4-layer compression. He was using Unna boots still 2 months ago the facility called to say they can no longer place these so we have been using 4-layer compression. The surface area of the area on the left is so large there is very few alternatives to what we can use on this either silver alginate or Hydrofera Blue. He was complaining of pain on the left heel last time I asked for an x-ray this was apparently done although we do not have the result. He is not complaining of pain today. 08/16/2020 on evaluation today patient is is seen for a early visit due to the fact that he apparently is having issues I was told when they called on Monday with a MRSA infection that they felt like we needed to see him for because his legs "looked horrible". With that being said based on what I see at this point it does not appear that the patient is actually having a terrible  infection in fact compared to last time I saw him his legs do not appear to be doing too poorly at all [name], [name] ([id number]) [phone].pdf Page 11 of 17 at this time. Nonetheless he has been on doxycycline that is not going to be a good option for him based on the culture report that I have for review today which graded as a partial report with  still several organisms pending as far as identification is concerned we did contact them but again they do not have the final report yet. Either way based on what we see it appears that doxycycline is one of the few medications that will not work for the Citrobacter. Obviously I think that a different medication would be a good option for him since there is a gram-negative organism pending I am likely can I suggest Cipro as the best of backup antibiotic to switch him to at this point. All this was discussed with the patient today. 12/20; not much change in the substantial wound on the left posterior calf and the small area on the right medial. He has a new area on the left anterior which looks like a wrap injury. Had some thoughts about doing a deep tissue culture here although we will put that off for next time. Using silver alginate as a primary dressing 2/10; substantial area on the left posterior calf. This measures smaller but still is a very large area. He has the area on the right anterior medial which also is a fairly large wound but much smaller than not on the left. His edema control is quite good. We have been using silver alginate because of the size of the wounds. 4/7; substantial wound on the left posterior calf and a smaller area on the right medial lower leg. These are very chronic wounds which we have classified is venous. Previously worked up at [facility] for 5 years ago at which time I had sent and will send him over for consideration of extensive skin graft I know they biopsied this many times but he never had  plastic surgery. The wound area is much too large for standard size dressings we have been using silver alginate but we did give him a good trial last fall of Hydrofera Blue that did not make any difference either. If anything the area on the right medial lower leg over time has gotten a lot smaller as has the area on the left although it still quite substantial now. Readmission 10/08/2021 Mr. Kalei Mckillop is a 67 year old male with a past medical history of insulin-dependent type 2 diabetes with last hemoglobin A1c of 10.2, chronic venous insufficiency, lymphedema and chronic diastolic heart failure that presents to the clinic for bilateral lower extremity wounds. He has been followed in our clinic previously for the past 6 years for these issues. He was last seen in April 2022. He is inconsistent with his appointments in our clinic. These wounds have not healed over the past few years. He is currently using silver alginate to the wound beds and keeping these covered with Kerlix. He currently denies systemic signs of infection. He was recently hospitalized for septic shock secondary to bacteremia from likely chronic leg wounds And is currently on oral antibiotics to be completed this week. 1/30; patient presents for follow-up. He has no issues or complaints today. 2/6; patient presents for follow-up. He reports more discomfort with the increase in compression wraps. He currently denies systemic signs of infection and feels well overall. 2/13; patient presents for follow-up. He reports tolerating the current leg/Coban wrap well. He denies systemic signs of infection and feels fine overall. 2/21; patient presents for follow-up. He has no issues or complaints today. He tolerated the kirlex/Coban wrap. He denies systemic signs of infection. 3/21; patient presents for follow-up. He has no issues or complaints today. He saw infectious disease, Dr. Megan Salon. Nothing further to do from an infectious  disease  point. Levaquin was stopped. He currently denies systemic signs of infection. 4/4; patient presents for follow-up. His facility was able to obtain the [facility] antibiotic and has started using this with dressing changes/wrap changes. Patient has no issues or complaints today. He denies signs of infection. 4/18; patient presents for follow-up. He has no issues or complaints today. He denies signs of infection. 5/9; patient presents for follow-up. He has been using [facility] antibiotic under Kerlix/Coban. He denies signs of infection today. 6/6; monthly follow-up. Patient is at [facility] skilled facility. He has been using Keystone probably silver alginate under kerlix Coban. We use Sorbact here. Wound condition has improved 7/11; patient presents for follow-up. We have been using Keystone with Sorbact under Kerlix/Coban. Patient has no issues or complaints today. 8/8; patient presents for follow-up. He is still using Keystone and Sorbact under Kerlix/Coban to his lower extremities bilaterally. He has no issues or complaints today. He denies signs of infection. 9/12; patient presents for follow-up. We have been using Keystone with Sorbact and calcium alginate under Kerlix/Coban to lower extremities bilaterally. He has no issues or complaints today. 10/10; patient presents for follow-up. We continue to use [facility] with Sorbact and calcium alginate under Kerlix/Coban. He has tolerated this well and he has no issues or complaints today. 11/14; patient presents for follow-up. We have been using Keystone with Sorbact and calcium alginate under Kerlix/Coban to the lower extremities bilaterally. He cannot tolerate higher compression. He states that the facility he resides and changes his dressings 3 times a week. He has no issues or complaints today. 12/12; patient presents for follow-up. We have been using Keystone antibiotic ointment with Sorbact and calcium alginate under Kerlix/Coban to the  lower extremities bilaterally. He is open to trying a higher level compression. His ABIs do not suggest significant arterial insufficiency. 1/8; patient presents for follow up. We have been using Keystone antibiotic ointment with Sorbact and calcium alginate. We increased the compression to 3 layer compression last visit however patient came in with Kerlix/Coban and no sorbact. It is unclear if the patient's facility has been using a higher level compression. We will follow-up with his facility. Patient History Information obtained from Patient. Family History Diabetes - Mother,Father,Siblings, Hypertension - Mother,Father, No family history of Cancer, Heart Disease, Hereditary Spherocytosis, Kidney Disease, Lung Disease, Seizures, Stroke, Thyroid Problems, Tuberculosis. Social History Current every day smoker - 4 cig per day, Marital Status - Separated, Alcohol Use - Never, Drug Use - Prior History - cocaine, [place] quit 2005, Caffeine Use - Moderate. Medical History Eyes Denies history of Cataracts, Glaucoma, Optic Neuritis Ear/Nose/Mouth/Throat Denies history of Chronic sinus problems/congestion, Middle ear problems Hematologic/Lymphatic Patient has history of Anemia - iron deficiency Denies history of Hemophilia, Human Immunodeficiency Virus, Lymphedema, Sickle Cell Disease Respiratory Patient has history of Sleep Apnea Denies history of Aspiration, Asthma, Chronic Obstructive Pulmonary Disease (COPD), Pneumothorax, Tuberculosis Cardiovascular Gahan, Nathanie ([id number]) [id number] Page 12 of 17 Patient has history of Congestive Heart Failure, Coronary Artery Disease, Hypertension, Peripheral Venous Disease Denies history of Angina, Arrhythmia, Deep Vein Thrombosis, Hypotension, Myocardial Infarction, Peripheral Arterial Disease, Phlebitis, Vasculitis Gastrointestinal Denies history of Cirrhosis , Colitis, Crohnoos, Hepatitis A, Hepatitis B, Hepatitis  C Endocrine Patient has history of Type II Diabetes - uncontrolled Denies history of Type I Diabetes Genitourinary Denies history of End Stage Renal Disease Immunological Denies history of Lupus Erythematosus, Raynaudoos, Scleroderma Integumentary (Skin) Denies history of History of Burn Musculoskeletal Patient has history of Osteoarthritis Denies history of Gout, Rheumatoid  Arthritis, Osteomyelitis Neurologic Patient has history of Neuropathy Denies history of Dementia, Quadriplegia, Paraplegia, Seizure Disorder Oncologic Denies history of Received Chemotherapy, Received Radiation Psychiatric Denies history of Anorexia/bulimia, Confinement Anxiety Hospitalization/Surgery History - inguinal hernia repair with mesh. - right shoulder rotator cuff repair. - toe nail excision. - Sepsis 09/03/21-09/10/22. Medical A Surgical History Notes nd Gastrointestinal GERD Musculoskeletal degenerative righ thip Psychiatric depression Objective Constitutional respirations regular, non-labored and within target range for patient.. Vitals Time Taken: 12:37 PM, Height: 77 in, Weight: 258 lbs, BMI: 30.6, Temperature: 98.5 F, Pulse: 70 bpm, Respiratory Rate: 18 breaths/min, Blood Pressure: 149/70 mmHg. Cardiovascular 2+ dorsalis pedis/posterior tibialis pulses. Psychiatric pleasant and cooperative. General Notes: Bilateral lower extremity wounds with granulation tissue and nonviable tissue, left > right. No signs of surrounding infection. Evidence of lymphedema skin changes bilaterally to the lower extremities. Integumentary (Hair, Skin) Wound #25 status is Open. Original cause of wound was Gradually Appeared. The date acquired was: 09/07/2021. The wound has been in treatment 51 weeks. The wound is located on the Right,Medial Lower Leg. The wound measures 5.5cm length x 2.6cm width x 0.1cm depth; 11.231cm^2 area and 1.123cm^3 volume. There is Fat Layer (Subcutaneous Tissue) exposed. There is  a medium amount of serosanguineous drainage noted. The wound margin is thickened. There is large (67-100%) red, pink granulation within the wound bed. There is a small (1-33%) amount of necrotic tissue within the wound bed including Adherent Slough. The periwound skin appearance did not exhibit: Callus, Crepitus, Excoriation, Induration, Rash, Scarring, Dry/Scaly, Maceration, Atrophie Blanche, Cyanosis, Ecchymosis, Hemosiderin Staining, Mottled, Pallor, Rubor, Erythema. Wound #27 status is Open. Original cause of wound was Gradually Appeared. The date acquired was: 09/07/2021. The wound has been in treatment 51 weeks. The wound is located on the Left,Posterior Lower Leg. The wound measures 19.5cm length x 18cm width x 0.4cm depth; 275.675cm^2 area and 110.27cm^3 volume. There is Fat Layer (Subcutaneous Tissue) exposed. There is a medium amount of serosanguineous drainage noted. The wound margin is distinct with the outline attached to the wound base. There is medium (34-66%) red, friable granulation within the wound bed. There is a medium (34-66%) amount of necrotic tissue within the wound bed including Eschar and Adherent Slough. The periwound skin appearance did not exhibit: Callus, Crepitus, Excoriation, Induration, Rash, Scarring, Dry/Scaly, Maceration, Atrophie Blanche, Cyanosis, Ecchymosis, Hemosiderin Staining, Mottled, Pallor, Rubor, Erythema. Periwound temperature was noted as No Abnormality. The periwound has tenderness on palpation. Wound #29 status is Open. Original cause of wound was Gradually Appeared. The date acquired was: 04/30/2022. The wound has been in treatment 21 weeks. The wound is located on the Right,Lateral Lower Leg. The wound measures 1cm length x 0.9cm width x 0.2cm depth; 0.707cm^2 area and 0.141cm^3 volume. There is Fat Layer (Subcutaneous Tissue) exposed. There is a medium amount of serosanguineous drainage noted. The wound margin is distinct with the outline attached to  the wound base. There is medium (34-66%) red, pink granulation within the wound bed. There is a medium (34-66%) amount of necrotic tissue within the wound bed including Adherent Slough. The periwound skin appearance did not exhibit: Callus, Crepitus, Excoriation, Induration, Rash, Scarring, Dry/Scaly, Maceration, Atrophie Blanche, Cyanosis, Ecchymosis, Hemosiderin Staining, Mottled, Pallor, Rubor, Erythema. Assessment [name], [name] ([id number]) [id number] Page 13 of 17 Active Problems ICD-10 Chronic venous hypertension (idiopathic) with ulcer and inflammation of bilateral lower extremity Non-pressure chronic ulcer of other part of left lower leg with fat layer exposed Non-pressure chronic ulcer of unspecified part of right lower leg with fat layer  exposed Lymphedema, not elsewhere classified Moderate protein-calorie malnutrition Type 2 diabetes mellitus with other skin ulcer Chronic diastolic (congestive) heart failure Patient's wounds are stable. It is unclear what dressings are being used on the patient from his facility. We will follow-up. I debrided nonviable tissue. I recommended continuing the course with calcium alginate, Sorbact, [facility] antibiotic under Kerlix/Coban. Follow-up in 1 month. Procedures Wound #25 Pre-procedure diagnosis of Wound #25 is a Venous Leg Ulcer located on the Right,Medial Lower Leg .Severity of Tissue Pre Debridement is: Fat layer exposed. There was a Excisional Skin/Subcutaneous Tissue Debridement with a total area of 14.3 sq cm performed by [name], DO. With the following instrument(s): Curette to remove Viable and Non-Viable tissue/material. Material removed includes Subcutaneous Tissue and Slough and after achieving pain control using Lidocaine 4% Topical Solution. A time out was conducted at 12:55, prior to the start of the procedure. A Minimum amount of bleeding was controlled with Pressure. The procedure was tolerated  well with a pain level of 0 throughout and a pain level of 0 following the procedure. Post Debridement Measurements: 5.5cm length x 2.6cm width x 0.1cm depth; 1.123cm^3 volume. Character of Wound/Ulcer Post Debridement is improved. Severity of Tissue Post Debridement is: Fat layer exposed. Post procedure Diagnosis Wound #25: Same as Pre-Procedure Wound #27 Pre-procedure diagnosis of Wound #27 is a Venous Leg Ulcer located on the Left,Posterior Lower Leg .Severity of Tissue Pre Debridement is: Fat layer exposed. There was a Excisional Skin/Subcutaneous Tissue Debridement with a total area of 351 sq cm performed by [name], DO. With the following instrument(s): Curette to remove Viable and Non-Viable tissue/material. Material removed includes Subcutaneous Tissue and Slough and after achieving pain control using Lidocaine 4% Topical Solution. A time out was conducted at 12:55, prior to the start of the procedure. A Minimum amount of bleeding was controlled with Pressure. The procedure was tolerated well with a pain level of 0 throughout and a pain level of 0 following the procedure. Post Debridement Measurements: 19.5cm length x 18cm width x 0.4cm depth; 110.27cm^3 volume. Character of Wound/Ulcer Post Debridement is improved. Severity of Tissue Post Debridement is: Fat layer exposed. Post procedure Diagnosis Wound #27: Same as Pre-Procedure Wound #29 Pre-procedure diagnosis of Wound #29 is a Venous Leg Ulcer located on the Right,Lateral Lower Leg .Severity of Tissue Pre Debridement is: Fat layer exposed. There was a Excisional Skin/Subcutaneous Tissue Debridement with a total area of 0.9 sq cm performed by [name], DO. With the following instrument(s): Curette to remove Viable and Non-Viable tissue/material. Material removed includes Subcutaneous Tissue and Slough and after achieving pain control using Lidocaine 4% Topical Solution. A time out was conducted at 12:55, prior to the  start of the procedure. A Minimum amount of bleeding was controlled with Pressure. The procedure was tolerated well with a pain level of 0 throughout and a pain level of 0 following the procedure. Post Debridement Measurements: 1cm length x 0.9cm width x 0.2cm depth; 0.141cm^3 volume. Character of Wound/Ulcer Post Debridement is improved. Severity of Tissue Post Debridement is: Fat layer exposed. Post procedure Diagnosis Wound #29: Same as Pre-Procedure Plan Follow-up Appointments: Return appointment in 1 month. - 4 weeks Dr. Heber [place] NEED STRETCHER ROOM ****EXTRA TIME 88 MINS. HOYEr/HIGH ACUITY*** No appt.'s after 11:00 and after 3:00!!**** ***ROOM 9 1230 10/28/2022.**** ****PLEASE WRAP LEGS BEGINNING AT TOES AND GOING TO BEND OF KNEE*** Other: - Pt. to bring in [place] to every [facility] appt. please!!:) ****FACILITY TO CONTINUE TO USE THE TOPICAL ANTIBIOTIC SPRAY  FROM [facility]-APPLY DIRECTLY TO WOUND BED.**** ****FACILITY TO USE CUTIMED SORBACT SWABS UNDER THE ALGINATE.**** ****FACILITY TO APPLY COMPRESSION WRAPS FROM BASE OF TOES TO JUST BELOW KNEE.***** Anesthetic: (In clinic) Topical Lidocaine 4% applied to wound bed Bathing/ Shower/ Hygiene: May shower and wash wound with soap and water. - when changing dressing Edema Control - Lymphedema / SCD / Other: Moisturize legs daily. - with dressing changes Additional Orders / Instructions: Follow Nutritious Diet - Monitor/Control Blood Sugars-Continue using ProStat Other: - Go to emergency room for fever, chills, strong odor from wounds, increased pain in wounds WOUND #25: - Lower Leg Wound Laterality: Right, Medial Cleanser: Soap and Water 3 x Per Week/30 Days Discharge Instructions: May shower and wash wound with dial antibacterial soap and water prior to dressing change. Cleanser: Wound Cleanser 3 x Per Week/30 Days Discharge Instructions: Cleanse the wound with wound cleanser prior to applying a clean dressing using gauze sponges, not  tissue or cotton balls. Peri-Wound Care: Sween Lotion (Moisturizing lotion) 3 x Per Week/30 Days Discharge Instructions: Apply moisturizing lotion as directed Prim Dressing: Cutimed Sorbact Swab 3 x Per Week/30 Days ary Discharge Instructions: Apply to wound bed as instructed Prim Dressing: Maxorb Extra Calcium Alginate Dressing, 4x4 in 3 x Per Week/30 Days ary Discharge Instructions: Apply calcium alginate over the cutimed to help absorb drainage. Prim Dressing: [facility] Antibiotic spray 3 x Per Week/30 Days ary Secondary Dressing: ABD Pad, 8x10 3 x Per Week/30 Days Hineman, Leeum ([id number]) [id number] Page 14 of 17 Discharge Instructions: Apply over primary dressing as directed. Com pression Wrap: Kerlix Roll 4.5x3.1 (in/yd) 3 x Per Week/30 Days Discharge Instructions: Apply Kerlix and Coban compression as directed. Com pression Wrap: Coban Self-Adherent Wrap 4x5 (in/yd) 3 x Per Week/30 Days Discharge Instructions: Apply over Kerlix as directed. WOUND #27: - Lower Leg Wound Laterality: Left, Posterior Cleanser: Soap and Water 3 x Per Week/30 Days Discharge Instructions: May shower and wash wound with dial antibacterial soap and water prior to dressing change. Cleanser: Wound Cleanser 3 x Per Week/30 Days Discharge Instructions: Cleanse the wound with wound cleanser prior to applying a clean dressing using gauze sponges, not tissue or cotton balls. Peri-Wound Care: Sween Lotion (Moisturizing lotion) 3 x Per Week/30 Days Discharge Instructions: Apply moisturizing lotion as directed Prim Dressing: Cutimed Sorbact Swab 3 x Per Week/30 Days ary Discharge Instructions: Apply to wound bed as instructed Prim Dressing: Maxorb Extra Calcium Alginate Dressing, 4x4 in 3 x Per Week/30 Days ary Discharge Instructions: Apply calcium alginate over the cutimed to help absorb drainage. Prim Dressing: Keystone Antibiotic spray 3 x Per Week/30 Days ary Secondary Dressing:  ABD Pad, 8x10 3 x Per Week/30 Days Discharge Instructions: Apply over primary dressing as directed. Com pression Wrap: Kerlix Roll 4.5x3.1 (in/yd) 3 x Per Week/30 Days Discharge Instructions: Apply Kerlix and Coban compression as directed. Com pression Wrap: Coban Self-Adherent Wrap 4x5 (in/yd) 3 x Per Week/30 Days Discharge Instructions: Apply over Kerlix as directed. WOUND #29: - Lower Leg Wound Laterality: Right, Lateral Cleanser: Soap and Water 3 x Per Week/30 Days Discharge Instructions: May shower and wash wound with dial antibacterial soap and water prior to dressing change. Cleanser: Wound Cleanser 3 x Per Week/30 Days Discharge Instructions: Cleanse the wound with wound cleanser prior to applying a clean dressing using gauze sponges, not tissue or cotton balls. Peri-Wound Care: Sween Lotion (Moisturizing lotion) 3 x Per Week/30 Days Discharge Instructions: Apply moisturizing lotion as directed Prim Dressing: Cutimed Sorbact Swab 3 x Per Week/30  Days ary Discharge Instructions: Apply to wound bed as instructed Prim Dressing: Maxorb Extra Calcium Alginate Dressing, 4x4 in 3 x Per Week/30 Days ary Discharge Instructions: Apply calcium alginate over the cutimed to help absorb drainage. Prim Dressing: Keystone Antibiotic spray 3 x Per Week/30 Days ary Secondary Dressing: ABD Pad, 8x10 3 x Per Week/30 Days Discharge Instructions: Apply over primary dressing as directed. Com pression Wrap: Kerlix Roll 4.5x3.1 (in/yd) 3 x Per Week/30 Days Discharge Instructions: Apply Kerlix and Coban compression as directed. Com pression Wrap: Coban Self-Adherent Wrap 4x5 (in/yd) 3 x Per Week/30 Days Discharge Instructions: Apply over Kerlix as directed. 1. Sorbact, calcium alginate, Keystone antibiotic ointment under Kerlix/Coban 2. Follow-up in 1 month 3. Follow-up with facility for orders 4. In office sharp debridement Electronic Signature(s) Signed: 09/30/2022 2:02:55 PM By: [name]  DO Entered By: [name] on 09/30/2022 13:17:53 -------------------------------------------------------------------------------- HxROS Details [name], [name] 09/30/2022 12:30 PM Medical Record Number: [id number] Patient Account Number: [id number] Date of Birth/Sex: Treating RN: [birth year] (67 y.o. M) Primary Care Provider: [name] Other Clinician: Referring Provider: Treating Provider/Extender: [name] in Treatment: 57 Information Obtained From Patient Eyes Medical History: Negative for: Cataracts; Glaucoma; Optic Neuritis Ear/Nose/Mouth/Throat Medical History: Negative for: Chronic sinus problems/congestion; Middle ear problems [name], [name] ([id number]) [id number] Page 15 of 17 Hematologic/Lymphatic Medical History: Positive for: Anemia - iron deficiency Negative for: Hemophilia; Human Immunodeficiency Virus; Lymphedema; Sickle Cell Disease Respiratory Medical History: Positive for: Sleep Apnea Negative for: Aspiration; Asthma; Chronic Obstructive Pulmonary Disease (COPD); Pneumothorax; Tuberculosis Cardiovascular Medical History: Positive for: Congestive Heart Failure; Coronary Artery Disease; Hypertension; Peripheral Venous Disease Negative for: Angina; Arrhythmia; Deep Vein Thrombosis; Hypotension; Myocardial Infarction; Peripheral Arterial Disease; Phlebitis; Vasculitis Gastrointestinal Medical History: Negative for: Cirrhosis ; Colitis; Crohns; Hepatitis A; Hepatitis B; Hepatitis C Past Medical History Notes: GERD Endocrine Medical History: Positive for: Type II Diabetes - uncontrolled Negative for: Type I Diabetes Time with diabetes: 1999 Treated with: Insulin Blood sugar tested every day: Yes T ested : 3x per day Blood sugar testing results: Breakfast: 81-200; Lunch: 180-210; Dinner: 200 Genitourinary Medical History: Negative for: End Stage Renal  Disease Immunological Medical History: Negative for: Lupus Erythematosus; Raynauds; Scleroderma Integumentary (Skin) Medical History: Negative for: History of Burn Musculoskeletal Medical History: Positive for: Osteoarthritis Negative for: Gout; Rheumatoid Arthritis; Osteomyelitis Past Medical History Notes: degenerative righ thip Neurologic Medical History: Positive for: Neuropathy Negative for: Dementia; Quadriplegia; Paraplegia; Seizure Disorder Oncologic Medical History: Negative for: Received Chemotherapy; Received Radiation Psychiatric Medical History: Negative for: Anorexia/bulimia; Confinement Anxiety Past Medical History Notes: depression Immunizations Pneumococcal Vaccine: [name], [name] ([id number]) [id number] Page 16 of 17 Received Pneumococcal Vaccination: Yes Received Pneumococcal Vaccination On or After 60th Birthday: No Implantable Devices None Hospitalization / Surgery History Type of Hospitalization/Surgery inguinal hernia repair with mesh right shoulder rotator cuff repair toe nail excision Sepsis 09/03/21-09/10/22 Family and Social History Cancer: No; Diabetes: Yes - Mother,Father,Siblings; Heart Disease: No; Hereditary Spherocytosis: No; Hypertension: Yes - Mother,Father; Kidney Disease: No; Lung Disease: No; Seizures: No; Stroke: No; Thyroid Problems: No; Tuberculosis: No; Current every day smoker - 4 cig per day; Marital Status - Separated; Alcohol Use: Never; Drug Use: Prior History - cocaine, [place] quit 2005; Caffeine Use: Moderate; Financial Concerns: No; Food, Clothing or Shelter Needs: No; Support System Lacking: No; Transportation Concerns: No Electronic Signature(s) Signed: 09/30/2022 2:02:55 PM By: [name] DO Entered By: [name] on 09/30/2022 13:15:08 -------------------------------------------------------------------------------- SuperBill Details Patient Name: Date of Service: HEA  RD, [name]  09/30/2022 Medical Record Number: [id number] Patient Account Number: [id number] Date of Birth/Sex: Treating RN: 06-20-1956 (67 y.o. 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 L97.912 Non-pressure chronic ulcer of unspecified part of right lower leg with fat layer exposed I89.0 Lymphedema, not elsewhere classified E44.0 Moderate protein-calorie malnutrition E11.622 Type 2 diabetes mellitus with other skin ulcer P61.95 Chronic diastolic (congestive) heart failure Facility Procedures : CPT4 Code: 09326712 Description: 11042 - DEB SUBQ TISSUE 20 SQ CM/< ICD-10 Diagnosis Description 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 L97.912 Non-pressure chronic ulcer of unspecified part of right lower leg with fat laye Modifier: ed r exposed Quantity: 1 : CPT4 Code: [id number] Description: 38250 - DEB SUBQ TISS EA ADDL 20CM ICD-10 Diagnosis Description 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 Modifier: ed Quantity: 18 Physician Procedures : CPT4 Code Description Modifier [id number] 11042 - WC PHYS SUBQ TISS 20 SQ CM ICD-10 Diagnosis Description 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ed L97.912 Non-pressure chronic ulcer of unspecified part of  right lower leg with fat layer exposed Nunn, Dian ([id number]) [id number]. Quantity: 1 pdf Page 17 of 17 : [id number] 11045 - WC PHYS SUBQ TISS EA ADDL 20 CM 18 ICD-10 Diagnosis Description L97.822 Non-pressure chronic ulcer of other  part of left lower leg with fat layer exposed Quantity: Electronic Signature(s) Signed: 09/30/2022 2:02:55 PM By: [name] DO Entered By: [name] on 09/30/2022 13:18:28

## 2022-10-03 DIAGNOSIS — F331 Major depressive disorder, recurrent, moderate: Secondary | ICD-10-CM | POA: Diagnosis not present

## 2022-10-04 DIAGNOSIS — R0989 Other specified symptoms and signs involving the circulatory and respiratory systems: Secondary | ICD-10-CM | POA: Diagnosis not present

## 2022-10-07 DIAGNOSIS — E1159 Type 2 diabetes mellitus with other circulatory complications: Secondary | ICD-10-CM | POA: Diagnosis not present

## 2022-10-07 DIAGNOSIS — I5042 Chronic combined systolic (congestive) and diastolic (congestive) heart failure: Secondary | ICD-10-CM | POA: Diagnosis not present

## 2022-10-07 DIAGNOSIS — F330 Major depressive disorder, recurrent, mild: Secondary | ICD-10-CM | POA: Diagnosis not present

## 2022-10-07 DIAGNOSIS — I110 Hypertensive heart disease with heart failure: Secondary | ICD-10-CM | POA: Diagnosis not present

## 2022-10-09 DIAGNOSIS — R0602 Shortness of breath: Secondary | ICD-10-CM | POA: Diagnosis not present

## 2022-10-09 DIAGNOSIS — I10 Essential (primary) hypertension: Secondary | ICD-10-CM | POA: Diagnosis not present

## 2022-10-09 DIAGNOSIS — I872 Venous insufficiency (chronic) (peripheral): Secondary | ICD-10-CM | POA: Diagnosis not present

## 2022-10-09 DIAGNOSIS — E119 Type 2 diabetes mellitus without complications: Secondary | ICD-10-CM | POA: Diagnosis not present

## 2022-10-10 DIAGNOSIS — I5042 Chronic combined systolic (congestive) and diastolic (congestive) heart failure: Secondary | ICD-10-CM | POA: Diagnosis not present

## 2022-10-14 DIAGNOSIS — Z79899 Other long term (current) drug therapy: Secondary | ICD-10-CM | POA: Diagnosis not present

## 2022-10-15 DIAGNOSIS — I10 Essential (primary) hypertension: Secondary | ICD-10-CM | POA: Diagnosis not present

## 2022-10-16 DIAGNOSIS — I10 Essential (primary) hypertension: Secondary | ICD-10-CM | POA: Diagnosis not present

## 2022-10-16 DIAGNOSIS — E118 Type 2 diabetes mellitus with unspecified complications: Secondary | ICD-10-CM | POA: Diagnosis not present

## 2022-10-16 DIAGNOSIS — E119 Type 2 diabetes mellitus without complications: Secondary | ICD-10-CM | POA: Diagnosis not present

## 2022-10-24 DIAGNOSIS — I110 Hypertensive heart disease with heart failure: Secondary | ICD-10-CM | POA: Diagnosis not present

## 2022-10-24 DIAGNOSIS — F330 Major depressive disorder, recurrent, mild: Secondary | ICD-10-CM | POA: Diagnosis not present

## 2022-10-24 DIAGNOSIS — I5042 Chronic combined systolic (congestive) and diastolic (congestive) heart failure: Secondary | ICD-10-CM | POA: Diagnosis not present

## 2022-10-24 DIAGNOSIS — E1159 Type 2 diabetes mellitus with other circulatory complications: Secondary | ICD-10-CM | POA: Diagnosis not present

## 2022-10-25 DIAGNOSIS — R5383 Other fatigue: Secondary | ICD-10-CM | POA: Diagnosis not present

## 2022-10-25 DIAGNOSIS — G4483 Primary cough headache: Secondary | ICD-10-CM | POA: Diagnosis not present

## 2022-10-25 DIAGNOSIS — I872 Venous insufficiency (chronic) (peripheral): Secondary | ICD-10-CM | POA: Diagnosis not present

## 2022-10-25 DIAGNOSIS — E119 Type 2 diabetes mellitus without complications: Secondary | ICD-10-CM | POA: Diagnosis not present

## 2022-10-28 ENCOUNTER — Ambulatory Visit (HOSPITAL_BASED_OUTPATIENT_CLINIC_OR_DEPARTMENT_OTHER): Payer: Non-veteran care | Admitting: Internal Medicine

## 2022-10-28 DIAGNOSIS — J205 Acute bronchitis due to respiratory syncytial virus: Secondary | ICD-10-CM | POA: Diagnosis not present

## 2022-10-29 DIAGNOSIS — R0600 Dyspnea, unspecified: Secondary | ICD-10-CM | POA: Diagnosis not present

## 2022-10-29 DIAGNOSIS — R0602 Shortness of breath: Secondary | ICD-10-CM | POA: Diagnosis not present

## 2022-10-29 DIAGNOSIS — J205 Acute bronchitis due to respiratory syncytial virus: Secondary | ICD-10-CM | POA: Diagnosis not present

## 2022-10-30 DIAGNOSIS — I10 Essential (primary) hypertension: Secondary | ICD-10-CM | POA: Diagnosis not present

## 2022-10-30 DIAGNOSIS — I872 Venous insufficiency (chronic) (peripheral): Secondary | ICD-10-CM | POA: Diagnosis not present

## 2022-10-30 DIAGNOSIS — R0602 Shortness of breath: Secondary | ICD-10-CM | POA: Diagnosis not present

## 2022-10-30 DIAGNOSIS — E119 Type 2 diabetes mellitus without complications: Secondary | ICD-10-CM | POA: Diagnosis not present

## 2022-10-31 DIAGNOSIS — J205 Acute bronchitis due to respiratory syncytial virus: Secondary | ICD-10-CM | POA: Diagnosis not present

## 2022-11-13 DIAGNOSIS — J449 Chronic obstructive pulmonary disease, unspecified: Secondary | ICD-10-CM | POA: Diagnosis not present

## 2022-11-13 DIAGNOSIS — I10 Essential (primary) hypertension: Secondary | ICD-10-CM | POA: Diagnosis not present

## 2022-11-13 DIAGNOSIS — J9610 Chronic respiratory failure, unspecified whether with hypoxia or hypercapnia: Secondary | ICD-10-CM | POA: Diagnosis not present

## 2022-11-13 DIAGNOSIS — G8929 Other chronic pain: Secondary | ICD-10-CM | POA: Diagnosis not present

## 2022-11-13 DIAGNOSIS — Z79899 Other long term (current) drug therapy: Secondary | ICD-10-CM | POA: Diagnosis not present

## 2022-11-14 DIAGNOSIS — I5042 Chronic combined systolic (congestive) and diastolic (congestive) heart failure: Secondary | ICD-10-CM | POA: Diagnosis not present

## 2022-11-19 DIAGNOSIS — M25572 Pain in left ankle and joints of left foot: Secondary | ICD-10-CM | POA: Diagnosis not present

## 2022-11-19 DIAGNOSIS — M79672 Pain in left foot: Secondary | ICD-10-CM | POA: Diagnosis not present

## 2022-11-21 DIAGNOSIS — L97929 Non-pressure chronic ulcer of unspecified part of left lower leg with unspecified severity: Secondary | ICD-10-CM | POA: Diagnosis not present

## 2022-11-21 DIAGNOSIS — M79672 Pain in left foot: Secondary | ICD-10-CM | POA: Diagnosis not present

## 2022-11-28 DIAGNOSIS — F330 Major depressive disorder, recurrent, mild: Secondary | ICD-10-CM | POA: Diagnosis not present

## 2022-11-28 DIAGNOSIS — E1159 Type 2 diabetes mellitus with other circulatory complications: Secondary | ICD-10-CM | POA: Diagnosis not present

## 2022-11-28 DIAGNOSIS — I110 Hypertensive heart disease with heart failure: Secondary | ICD-10-CM | POA: Diagnosis not present

## 2022-11-28 DIAGNOSIS — I5042 Chronic combined systolic (congestive) and diastolic (congestive) heart failure: Secondary | ICD-10-CM | POA: Diagnosis not present

## 2022-11-29 DIAGNOSIS — E1165 Type 2 diabetes mellitus with hyperglycemia: Secondary | ICD-10-CM | POA: Diagnosis not present

## 2022-12-10 DIAGNOSIS — Z043 Encounter for examination and observation following other accident: Secondary | ICD-10-CM | POA: Diagnosis not present

## 2022-12-11 DIAGNOSIS — Z79899 Other long term (current) drug therapy: Secondary | ICD-10-CM | POA: Diagnosis not present

## 2022-12-17 ENCOUNTER — Encounter (HOSPITAL_BASED_OUTPATIENT_CLINIC_OR_DEPARTMENT_OTHER): Payer: Medicare HMO | Attending: Internal Medicine | Admitting: Internal Medicine

## 2022-12-17 DIAGNOSIS — E11622 Type 2 diabetes mellitus with other skin ulcer: Secondary | ICD-10-CM | POA: Diagnosis not present

## 2022-12-17 DIAGNOSIS — I110 Hypertensive heart disease with heart failure: Secondary | ICD-10-CM | POA: Insufficient documentation

## 2022-12-17 DIAGNOSIS — L97912 Non-pressure chronic ulcer of unspecified part of right lower leg with fat layer exposed: Secondary | ICD-10-CM | POA: Diagnosis not present

## 2022-12-17 DIAGNOSIS — L97919 Non-pressure chronic ulcer of unspecified part of right lower leg with unspecified severity: Secondary | ICD-10-CM | POA: Insufficient documentation

## 2022-12-17 DIAGNOSIS — M1990 Unspecified osteoarthritis, unspecified site: Secondary | ICD-10-CM | POA: Insufficient documentation

## 2022-12-17 DIAGNOSIS — L97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ed: Secondary | ICD-10-CM | POA: Diagnosis not present

## 2022-12-17 DIAGNOSIS — J449 Chronic obstructive pulmonary disease, unspecified: Secondary | ICD-10-CM | POA: Diagnosis not present

## 2022-12-17 DIAGNOSIS — E1140 Type 2 diabetes mellitus with diabetic neuropathy, unspecified: Secondary | ICD-10-CM | POA: Insufficient documentation

## 2022-12-17 DIAGNOSIS — I2510 Atherosclerotic heart disease of native coronary artery without angina pectoris: Secondary | ICD-10-CM | POA: Diagnosis not present

## 2022-12-17 DIAGNOSIS — I509 Heart failure, unspecified: Secondary | ICD-10-CM | POA: Insufficient documentation

## 2022-12-17 NOTE — Progress Notes (Signed)
[name], [name] ([id number].pdf Page 1 of 10 Visit Report for 12/17/2022 Allergy List Details Patient Name: Date of Service: [name], [name] 12/17/2022 9:30 [name] M Medical Record Number: [id number] Patient Account Number: [id number] Date of Birth/Sex: Treating RN: 1955/12/06 (67 y.o. [name] Primary Care Blondie Riggsbee: [name] Other Clinician: Referring Alexie Lanni: Treating Barth Trella/Extender: [name] in Treatment: 0 Allergies Active Allergies Sulfa (Sulfonamide Antibiotics) Reaction: sweating,chils, body aches Severity: Severe Allergy Notes Electronic Signature(s) Signed: 12/17/2022 4:36:36 PM By: [name] Entered By: [name] on 12/17/2022 10:01:06 -------------------------------------------------------------------------------- Arrival Information Details Patient Name: Date of Service: [name], [name] 12/17/2022 9:30 [name] M Medical Record Number: [id number] Patient Account Number: [id number] Date of Birth/Sex: Treating RN: 04-13-1956 (67 y.o. M) Primary Care Conroy Goracke: [name] Other Clinician: Referring Anahi Belmar: Treating Nyilah Kight/Extender: [name] in Treatment: 0 Visit Information Patient Arrived: Wheel Chair Arrival Time: 09:56 Accompanied By: self Transfer Assistance: None Patient Identification Verified: Yes Secondary Verification Process Completed: Yes Patient Requires Transmission-Based Precautions: No Patient Has Alerts: No History Since Last Visit Added or deleted any medications: No Any new allergies or adverse reactions: No Had [name] fall or experienced change in activities of daily living that may affect risk of falls: No Signs or symptoms of abuse/neglect since last visito No Hospitalized since last visit: No Implantable device outside of the clinic excluding cellular tissue based products placed in the center since last visit: No Electronic  Signature(s) Signed: 12/17/2022 4:36:36 PM By: [name] Entered By: [name] on 12/17/2022 09:59:08 -------------------------------------------------------------------------------- Clinic Level of Care Assessment Details Patient Name: Date of Service: [name], [name] 12/17/2022 9:30 [name] M Medical Record Number: [id number] Patient Account Number: [id number] Date of Birth/Sex: Treating RN: 1956/03/30 (67 y.o. [name] Primary Care Glenda Kunst: [name] Other Clinician: Referring Theophil Thivierge: Treating Gerrod Maule/Extender: [name] in Treatment: 0 Clinic Level of Care Assessment Items TOOL 1 Quantity Score X- 1 0 Use when EandM and Procedure is performed on INITIAL visit Blackham, Yashua ([id number].pdf Page 2 of 10 ASSESSMENTS - Nursing Assessment / Reassessment X- 1 20 General Physical Exam (combine w/ comprehensive assessment (listed just below) when performed on new pt. evals) X- 1 25 Comprehensive Assessment (HX, ROS, Risk Assessments, Wounds Hx, etc.) ASSESSMENTS - Wound and Skin Assessment / Reassessment X- 1 10 Dermatologic / Skin Assessment (not related to wound area) ASSESSMENTS - Ostomy and/or Continence Assessment and Care []  - 0 Incontinence Assessment and Management []  - 0 Ostomy Care Assessment and Management (repouching, etc.) PROCESS - Coordination of Care []  - 0 Simple Patient / Family Education for ongoing care X- 1 20 Complex (extensive) Patient / Family Education for ongoing care X- 1 10 Staff obtains Programmer, systems, Records, T Results / Process Orders est X- 1 10 Staff telephones HHA, Nursing Homes / Clarify orders / etc []  - 0 Routine Transfer to another Facility (non-emergent condition) []  - 0 Routine Hospital Admission (non-emergent condition) X- 1 15 New Admissions / Biomedical engineer / Ordering NPWT Apligraf, etc. , []  - 0 Emergency Hospital Admission (emergent  condition) PROCESS - Special Needs []  - 0 Pediatric / Minor Patient Management []  - 0 Isolation Patient Management []  - 0 Hearing / Language / Visual special needs []  - 0 Assessment of Community assistance (transportation, D/C planning, etc.) []  - 0 Additional assistance / Altered mentation []  - 0 Support Surface(s) Assessment (bed, cushion, seat, etc.) INTERVENTIONS - Miscellaneous []  - 0 External ear exam []  -  0 Patient Transfer (multiple staff / Civil Service fast streamer / Similar devices) []  - 0 Simple Staple / Suture removal (25 or less) []  - 0 Complex Staple / Suture removal (26 or more) []  - 0 Hypo/Hyperglycemic Management (do not check if billed separately) []  - 0 Ankle / Brachial Index (ABI) - do not check if billed separately Has the patient been seen at the hospital within the last three years: Yes Total Score: 110 Level Of Care: New/Established - Level 3 Electronic Signature(s) Signed: 12/17/2022 4:22:41 PM By: [name] RN, BSN Entered By: [name] on 12/17/2022 10:53:19 -------------------------------------------------------------------------------- Encounter Discharge Information Details Patient Name: Date of Service: [name], [name] 12/17/2022 9:30 [name] M Medical Record Number: [id number] Patient Account Number: [id number] Date of Birth/Sex: Treating RN: [birth year] (67 y.o. [name] Primary Care Rolla Kedzierski: [name] Other Clinician: Referring Keatin Benham: Treating Kasper Mudrick/Extender: [name] in Treatment: 0 Encounter Discharge Information Items Post Procedure Vitals Discharge Condition: Stable Temperature (F): 97.9 Ambulatory Status: Wheelchair Pulse (bpm): 64 Discharge Destination: Home Respiratory Rate (breaths/min): 18 [name], [name] ([id number]) [phone].pdf Page 3 of 10 Transportation: Private Auto Blood Pressure (mmHg): 119/70 Accompanied By: self Schedule Follow-up Appointment: Yes Clinical  Summary of Care: Electronic Signature(s) Signed: 12/17/2022 4:22:41 PM By: [name] RN, BSN Entered By: [name] on 12/17/2022 10:54:57 -------------------------------------------------------------------------------- Lower Extremity Assessment Details Patient Name: Date of Service: [name], [name] 12/17/2022 9:30 [name] M Medical Record Number: [id number] Patient Account Number: [id number] Date of Birth/Sex: Treating RN: 04-Mar-1956 (67 y.o. [name] Primary Care Noemi Ishmael: [name] Other Clinician: Referring Levada Bowersox: Treating Izabelle Daus/Extender: [name] in Treatment: 0 Edema Assessment Assessed: [name]: Yes] Patrice Paradise: Yes] Edema: [Left: Yes] [Right: Yes] Calf Left: Right: Point of Measurement: From Medial Instep 42 cm 42 cm Ankle Left: Right: Point of Measurement: From Medial Instep 27 cm 27 cm Electronic Signature(s) Signed: 12/17/2022 4:22:41 PM By: [name] RN, BSN Entered By: [name] on 12/17/2022 10:47:46 -------------------------------------------------------------------------------- Multi Wound Chart Details Patient Name: Date of Service: [name], [name] 12/17/2022 9:30 [name] M Medical Record Number: [id number] Patient Account Number: [id number] Date of Birth/Sex: Treating RN: October 05, 1955 (67 y.o. M) Primary Care Darin Arndt: [name] Other Clinician: Referring Jaspreet Hollings: Treating Loyce Klasen/Extender: [name] in Treatment: 0 Vital Signs Height(in): Pulse(bpm): 64 Weight(lbs): 245.6 Blood Pressure(mmHg): 119/70 Body Mass Index(BMI): Temperature(F): 97.9 Respiratory Rate(breaths/min): 18 [30:Photos:] [[name]:[name] [phone].pdf Page 4 of 10] Left, Posterior Lower Leg Right, Medial Lower Leg [name] Wound Location: Gradually Appeared Gradually Appeared [name] Wounding Event: Venous Leg Ulcer T be determined o [name] Primary Etiology: Anemia, Chronic Obstructive Anemia, Chronic  Obstructive [name] Comorbid History: Pulmonary Disease (COPD), Sleep Pulmonary Disease (COPD), Sleep Apnea, Congestive Heart Failure, Apnea, Congestive Heart Failure, Coronary Artery Disease, Coronary Artery Disease, Hypertension, Peripheral Venous Hypertension, Peripheral Venous Disease, Type II Diabetes, Disease, Type II Diabetes, Osteoarthritis, Neuropathy Osteoarthritis, Neuropathy 09/23/2020 10/31/2022 [name] Date Acquired: 0 0 [name] Weeks of Treatment: Open Open [name] Wound Status: No No [name] Wound Recurrence: 31x17.5x0.2 5x1.2x0.2 [name] Measurements L x W x D (cm) 426.079 4.712 [name] [name] (cm) : rea 85.216 0.942 [name] Volume (cm) : Full Thickness Without Exposed Full Thickness Without Exposed [name] Classification: Support Structures Support Structures [name] Medium [name] Exudate [name] mount: [name] Serosanguineous [name] Exudate Type: [name] red, brown [name] Exudate Color: [name] Distinct, outline attached [name] Wound Margin: Small (1-33%) Medium (34-66%) [name] Granulation [name] mount: Red Red [name] Granulation Quality: Large (67-100%) Medium (34-66%) [name] Necrotic [name] mount:  Fat Layer (Subcutaneous Tissue): Yes Fat Layer (Subcutaneous Tissue): Yes [name] Exposed Structures: Fascia: No Tendon: No Muscle: No Joint: No Bone: No [name] Small (1-33%) [name] Epithelialization: Debridement - Excisional Debridement - Excisional [name] Debridement: Pre-procedure Verification/Time Out 10:40 10:40 [name] Taken: Lidocaine 4% Topical Solution Lidocaine 4% Topical Solution [name] Pain Control: Subcutaneous, Slough Subcutaneous, Slough [name] Tissue Debrided: Skin/Subcutaneous Tissue Skin/Subcutaneous Tissue [name] Level: 542.5 10 [name] Debridement [name] (sq cm): rea Curette Curette [name] Instrument: Minimum Minimum [name] Bleeding: Pressure Pressure [name] Hemostasis [name] chieved: 0 0 [name] Procedural Pain: 0 0 [name] Post Procedural Pain: Procedure was tolerated well Procedure was tolerated well [name] Debridement Treatment Response: 31x17.5x0.5 5x1.2x0.2  [name] Post Debridement Measurements L x W x D (cm) 213.039 0.942 [name] Post Debridement Volume: (cm) Excoriation: No Excoriation: No [name] Periwound Skin Texture: Induration: No Induration: No Callus: No Callus: No Crepitus: No Crepitus: No Rash: No Rash: No Scarring: No Scarring: No Maceration: No Dry/Scaly: Yes [name] Periwound Skin Moisture: Dry/Scaly: No Maceration: No Atrophie Blanche: No Hemosiderin Staining: Yes [name] Periwound Skin Color: Cyanosis: No Atrophie Blanche: No Ecchymosis: No Cyanosis: No Erythema: No Ecchymosis: No Hemosiderin Staining: No Erythema: No Mottled: No Mottled: No Pallor: No Pallor: No Rubor: No Rubor: No No Abnormality [name] [name] Temperature: Debridement Debridement [name] Procedures Performed: Treatment Notes Electronic Signature(s) Signed: 12/17/2022 11:07:58 AM By: [name] DO Entered By: [name] on 12/17/2022 10:48:38 [name], [name] ([id number].pdf Page 5 of 10 -------------------------------------------------------------------------------- Multi-Disciplinary Care Plan Details Patient Name: Date of Service: [name], [name] 12/17/2022 9:30 [name] M Medical Record Number: [id number] Patient Account Number: [id number] Date of Birth/Sex: Treating RN: [birth year] (67 y.o. [name] Primary Care Lawrence Mitch: [name] Other Clinician: Referring Truman Aceituno: Treating Ura Yingling/Extender: [name] in Treatment: 0 Active Inactive Orientation to the Wound Care Program Nursing Diagnoses: Knowledge deficit related to the wound healing center program Goals: Patient/caregiver will verbalize understanding of the [facility] Date Initiated: 12/17/2022 Target Resolution Date: 12/27/2022 Goal Status: Active Interventions: Provide education on orientation to the wound center Notes: Pain, Acute or Chronic Nursing Diagnoses: Pain, acute or chronic: actual or  potential Potential alteration in comfort, pain Goals: Patient will verbalize adequate pain control and receive pain control interventions during procedures as needed Date Initiated: 12/17/2022 Target Resolution Date: 12/27/2022 Goal Status: Active Interventions: Encourage patient to take pain medications as prescribed Provide education on pain management Reposition patient for comfort Treatment Activities: Administer pain control measures as ordered : 12/17/2022 Notes: Wound/Skin Impairment Nursing Diagnoses: Knowledge deficit related to ulceration/compromised skin integrity Goals: Patient/caregiver will verbalize understanding of skin care regimen Date Initiated: 12/17/2022 Target Resolution Date: 12/26/2022 Goal Status: Active Interventions: Assess patient/caregiver ability to perform ulcer/skin care regimen upon admission and as needed Assess ulceration(s) every visit Provide education on ulcer and skin care Treatment Activities: Skin care regimen initiated : 12/17/2022 Topical wound management initiated : 12/17/2022 Notes: Electronic Signature(s) Signed: 12/17/2022 4:22:41 PM By: [name] RN, BSN Entered By: [name] on 12/17/2022 10:52:38 [name], [name] ([id number] Page 6 of 10 -------------------------------------------------------------------------------- Pain Assessment Details Patient Name: Date of Service: [name], [name] 12/17/2022 9:30 [name] M Medical Record Number: [id number] Patient Account Number: [id number] Date of Birth/Sex: Treating RN: Dec 17, 1955 (67 y.o. [name] Primary Care Brittannie Tawney: [name] Other Clinician: Referring Dan Dissinger: Treating Mykiah Schmuck/Extender: [name] in Treatment: 0 Active Problems Location of Pain Severity and Description of Pain Patient Has Paino No Site Locations Pain Management and Medication  Current Pain Management: Electronic Signature(s) Signed:  12/17/2022 4:22:41 PM By: [name] RN, BSN Entered By: [name] on 12/17/2022 10:47:53 -------------------------------------------------------------------------------- Patient/Caregiver Education Details Patient Name: Date of Service: [name], [name] 3/26/2024andnbsp9:30 [place] Record Number: [id number] Patient Account Number: [id number] Date of Birth/Gender: Treating RN: 03-13-1956 (67 y.o. [name] Primary Care Physician: [name] Other Clinician: Referring Physician: Treating Physician/Extender: [name] in Treatment: 0 Education Assessment Education Provided To: Patient Education Topics Provided Wound/Skin Impairment: Handouts: Caring for Your Ulcer Methods: Explain/Verbal Responses: State content correctly Electronic Signature(s) Signed: 12/17/2022 4:22:41 PM By: [name] RN, BSN Entered By: [name] on 12/17/2022 10:52:46 [name], [name] ([id number] Page 7 of 10 -------------------------------------------------------------------------------- Wound Assessment Details Patient Name: Date of Service: [name], [name] 12/17/2022 9:30 [name] M Medical Record Number: [id number] Patient Account Number: [id number] Date of Birth/Sex: Treating RN: 1956/06/30 (67 y.o. [name] Primary Care Prestina Raigoza: [name] Other Clinician: Referring Stalin Gruenberg: Treating Lesean Woolverton/Extender: [name] in Treatment: 0 Wound Status Wound Number: 30 Primary Venous Leg Ulcer Etiology: Wound Location: Left, Posterior Lower Leg Wound Open Wounding Event: Gradually Appeared Status: Date Acquired: 09/23/2020 Comorbid Anemia, Chronic Obstructive Pulmonary Disease (COPD), Sleep Weeks Of Treatment: 0 History: Apnea, Congestive Heart Failure, Coronary Artery Disease, Clustered Wound: No Hypertension, Peripheral Venous Disease, Type II Diabetes, Osteoarthritis,  Neuropathy Photos Wound Measurements Length: (cm) Width: (cm) Depth: (cm) Area: (cm) Volume: (cm) 31 % Reduction in Area: 17.5 % Reduction in Volume: 0.2 426.079 85.216 Wound Description Classification: Full Thickness Without Exposed Support Structures Wound Bed Granulation Amount: Small (1-33%) Exposed Structure Granulation Quality: Red Fat Layer (Subcutaneous Tissue) Exposed: Yes Necrotic Amount: Large (67-100%) Necrotic Quality: Adherent Slough Periwound Skin Texture Texture Color No Abnormalities Noted: No No Abnormalities Noted: No Callus: No Atrophie Blanche: No Crepitus: No Cyanosis: No Excoriation: No Ecchymosis: No Induration: No Erythema: No Rash: No Hemosiderin Staining: No Scarring: No Mottled: No Pallor: No Moisture Rubor: No No Abnormalities Noted: No Dry / Scaly: No Temperature / Pain Maceration: No Temperature: No Abnormality Treatment Notes Wound #30 (Lower Leg) Wound Laterality: Left, Posterior Cleanser Soap and Water Discharge Instruction: May shower and wash wound with dial antibacterial soap and water prior to dressing change. Wound Cleanser Discharge Instruction: Cleanse the wound with wound cleanser prior to applying [name] clean dressing using gauze sponges, not tissue or cotton balls. [name], [name] ([id number] Page 8 of 10 Peri-Wound Care Zinc Oxide Ointment 30g tube Discharge Instruction: Apply Zinc Oxide TO MACERATED AREAS AROUND THE WOUND. Sween Lotion (Moisturizing lotion) Discharge Instruction: Apply moisturizing lotion as directed Topical Primary Dressing Maxorb Extra Ag+ Alginate Dressing, 4x4.75 (in/in) Discharge Instruction: Apply to wound bed as instructed Secondary Dressing ABD Pad, 8x10 Discharge Instruction: Apply over primary dressing as directed. Secured With Compression Wrap Kerlix Roll 4.5x3.1 (in/yd) Discharge Instruction: Apply Kerlix and Coban compression as  directed. Coban Self-Adherent Wrap 4x5 (in/yd) Discharge Instruction: Apply over Kerlix as directed. Compression Stockings Add-Ons Electronic Signature(s) Signed: 12/17/2022 4:22:41 PM By: [name] RN, BSN Entered By: [name] on 12/17/2022 10:26:07 -------------------------------------------------------------------------------- Wound Assessment Details Patient Name: Date of Service: [name], [name] 12/17/2022 9:30 [name] M Medical Record Number: [id number] Patient Account Number: [id number] Date of Birth/Sex: Treating RN: 1956/03/17 (67 y.o. [name] Primary Care Keiarra Charon: [name] Other Clinician: Referring Joelynn Dust: Treating Keavon Sensing/Extender: [name] in Treatment: 0 Wound Status Wound Number: 31 Primary Venous Leg Ulcer Etiology: Wound Location: Right,  Medial Lower Leg Wound Open Wounding Event: Gradually Appeared Status: Date Acquired: 10/31/2022 Comorbid Anemia, Chronic Obstructive Pulmonary Disease (COPD), Sleep Weeks Of Treatment: 0 History: Apnea, Congestive Heart Failure, Coronary Artery Disease, Clustered Wound: No Hypertension, Peripheral Venous Disease, Type II Diabetes, Osteoarthritis, Neuropathy Photos Wound Measurements Length: (cm) 5 Width: (cm) 1.2 Depth: (cm) 0.2 Area: (cm) 4.712 Volume: (cm) 0.942 [name], [name] ([id number]) Wound Description Classification: Full Thickness Without Exposed Support Structures Wound Margin: Distinct, outline attached Exudate Amount: Medium Exudate Type: Serosanguineous Exudate Color: red, brown Foul Odor After Cleansing: No Slough/Fibrino No % Reduction in Area: % Reduction in Volume: Epithelialization: Small (1-33%) Tunneling: No Undermining: No [phone].pdf Page 9 of 10 Wound Bed Granulation Amount: Medium (34-66%) Exposed Structure Granulation Quality: Red Fascia Exposed: No Necrotic Amount: Medium (34-66%) Fat Layer (Subcutaneous Tissue)  Exposed: Yes Necrotic Quality: Adherent Slough Tendon Exposed: No Muscle Exposed: No Joint Exposed: No Bone Exposed: No Periwound Skin Texture Texture Color No Abnormalities Noted: No No Abnormalities Noted: No Callus: No Atrophie Blanche: No Crepitus: No Cyanosis: No Excoriation: No Ecchymosis: No Induration: No Erythema: No Rash: No Hemosiderin Staining: Yes Scarring: No Mottled: No Pallor: No Moisture Rubor: No No Abnormalities Noted: No Dry / Scaly: Yes Maceration: No Treatment Notes Wound #31 (Lower Leg) Wound Laterality: Right, Medial Cleanser Soap and Water Discharge Instruction: May shower and wash wound with dial antibacterial soap and water prior to dressing change. Wound Cleanser Discharge Instruction: Cleanse the wound with wound cleanser prior to applying [name] clean dressing using gauze sponges, not tissue or cotton balls. Peri-Wound Care Zinc Oxide Ointment 30g tube Discharge Instruction: Apply Zinc Oxide TO MACERATED AREAS AROUND THE WOUND. Sween Lotion (Moisturizing lotion) Discharge Instruction: Apply moisturizing lotion as directed Topical Primary Dressing Maxorb Extra Ag+ Alginate Dressing, 4x4.75 (in/in) Discharge Instruction: Apply to wound bed as instructed Secondary Dressing ABD Pad, 8x10 Discharge Instruction: Apply over primary dressing as directed. Secured With Compression Wrap Kerlix Roll 4.5x3.1 (in/yd) Discharge Instruction: Apply Kerlix and Coban compression as directed. Coban Self-Adherent Wrap 4x5 (in/yd) Discharge Instruction: Apply over Kerlix as directed. Compression Stockings Add-Ons Electronic Signature(s) Signed: 12/17/2022 4:22:41 PM By: [name] RN, BSN Entered By: [name] on 12/17/2022 10:48:57 [name], [name] ([id number] Page 10 of 10 -------------------------------------------------------------------------------- Vitals Details Patient Name: Date of Service: [name], [name]  [name] 12/17/2022 9:30 [name] M Medical Record Number: [id number] Patient Account Number: [id number] Date of Birth/Sex: Treating RN: Aug 20, 1956 (67 y.o. M) Primary Care Tomoki Lucken: [name] Other Clinician: Referring Patria Warzecha: Treating Omarr Hann/Extender: [name] in Treatment: 0 Vital Signs Time Taken: 09:59 Temperature (F): 97.9 Weight (lbs): 245.6 Pulse (bpm): 64 Respiratory Rate (breaths/min): 18 Blood Pressure (mmHg): 119/70 Reference Range: 80 - 120 mg / dl Electronic Signature(s) Signed: 12/17/2022 4:36:36 PM By: [name] Entered By: [name] on 12/17/2022 10:00:51

## 2022-12-17 NOTE — Progress Notes (Signed)
[name], [name] ([id number]) [id number] Page 1 of 4 Visit Report for 12/17/2022 Abuse Risk Screen Details Patient Name: Date of Service: [name], [name] 12/17/2022 9:30 [name] M Medical Record Number: [id number] Patient Account Number: [id number] Date of Birth/Sex: Treating RN: [name] (67 y.o. M) Primary Care [name]: [name] Other Clinician: Referring [name]: Treating [name]/Extender: [name] in Treatment: 0 Abuse Risk Screen Items Answer ABUSE RISK SCREEN: Has anyone close to you tried to hurt or harm you recentlyo No Do you feel uncomfortable with anyone in your familyo No Has anyone forced you do things that you didnt want to doo No Electronic Signature(s) Signed: 12/17/2022 4:36:36 PM By: [name] Entered By: [name] on 12/17/2022 10:05:30 -------------------------------------------------------------------------------- Activities of Daily Living Details Patient Name: Date of Service: [name], [name] 12/17/2022 9:30 [name] M Medical Record Number: [id number] Patient Account Number: [id number] Date of Birth/Sex: Treating RN: [name] (67 y.o. M) Primary Care Annistyn Depass: [name] Other Clinician: Referring [name]: Treating [name]/Extender: [name] in Treatment: 0 Activities of Daily Living Items Answer Activities of Daily Living (Please select one for each item) Drive Automobile Not Able T Medications ake Need Assistance Use T elephone Completely Able Care for Appearance Not Able Use T oilet Need Assistance Bath / Shower Need Assistance Dress Self Need Assistance Feed Self Completely Able Walk Not Able Get In / Out Bed Need Assistance Housework Need Assistance Prepare Meals Not Able Handle Money Not Able Shop for Self Not Able Electronic Signature(s) Signed: 12/17/2022 4:36:36 PM By: [name] Entered By: [name] on 12/17/2022  10:06:03 -------------------------------------------------------------------------------- Education Screening Details Patient Name: Date of Service: [name], [name] 12/17/2022 9:30 [name] M Medical Record Number: [id number] Patient Account Number: [id number] Date of Birth/Sex: Treating RN: [birth year] (67 y.o. M) Primary Care [name]: [name] Other Clinician: Referring [name]: Treating [name]/Extender: [name] in Treatment: 0 [name], [name] ([id number]) [id number] Page 2 of 4 Primary Learner Assessed: Patient Learning Preferences/Education Level/Primary Language Learning Preference: Explanation, Demonstration, Printed Material Highest Education Level: High School Preferred Language: Diplomatic Services operational officer Language Barrier: No Translator Needed: No Memory Deficit: No Emotional Barrier: No Cultural/Religious Beliefs Affecting Medical Care: No Physical Barrier Impaired Vision: No Impaired Hearing: No Decreased Hand dexterity: No Knowledge/Comprehension Knowledge Level: High Comprehension Level: High Ability to understand written instructions: High Ability to understand verbal instructions: High Motivation Anxiety Level: Calm Cooperation: Cooperative Education Importance: Acknowledges Need Interest in Health Problems: Asks Questions Perception: Coherent Willingness to Engage in Self-Management High Activities: Readiness to Engage in Self-Management High Activities: Electronic Signature(s) Signed: 12/17/2022 4:36:36 PM By: [name] Entered By: [name] on 12/17/2022 10:06:53 -------------------------------------------------------------------------------- Fall Risk Assessment Details Patient Name: Date of Service: [name], [name] 12/17/2022 9:30 [name] M Medical Record Number: [id number] Patient Account Number: [id number] Date of Birth/Sex: Treating RN: [name] (67 y.o. M) Primary Care [name]:  [name] Other Clinician: Referring Neely [name]: Treating [name]/Extender: [name] in Treatment: 0 Fall Risk Assessment Items Have you had 2 or more falls in the last 12 monthso 0 No Have you had any fall that resulted in injury in the last 12 monthso 0 No FALLS RISK SCREEN History of falling - immediate or within 3 months 0 No Secondary diagnosis (Do you have 2 or more medical diagnoseso) 15 Yes Ambulatory aid None/bed rest/wheelchair/nurse 0 Yes Crutches/cane/walker 15 Yes Furniture 0 No Intravenous therapy Access/Saline/Heparin Lock 0 No Gait/Transferring Normal/ bed rest/ wheelchair 0 Yes  Weak (short steps with or without shuffle, stooped but able to lift head while walking, may seek 0 No support from furniture) Impaired (short steps with shuffle, may have difficulty arising from chair, head down, impaired 0 No balance) Mental Status Oriented to own ability 0 Yes Overestimates or forgets limitations 0 No Risk Level: Medium Risk Score: 30 [name], [name] ([id number]) [id number] Page 3 of 4 Electronic Signature(s) -------------------------------------------------------------------------------- Foot Assessment Details Patient Name: Date of Service: [name], [name] 12/17/2022 9:30 [name] M Medical Record Number: [id number] Patient Account Number: [id number] Date of Birth/Sex: Treating RN: [birth year] (67 y.o. M) Primary Care [name]: [name] Other Clinician: Referring [name]: Treating [name]/Extender: [name] in Treatment: 0 Foot Assessment Items Site Locations + = Sensation present, - = Sensation absent, C = Callus, U = Ulcer R = Redness, W = Warmth, M = Maceration, PU = Pre-ulcerative lesion F = Fissure, S = Swelling, D = Dryness Assessment Right: Left: Other Deformity: No No Prior Foot Ulcer: No No Prior Amputation: No No Charcot Joint: No No Ambulatory Status:  Non-ambulatory Assistance Device: Wheelchair Gait: Administrator, arts) Signed: 12/17/2022 4:36:36 PM By: [name] Entered By: [name] on 12/17/2022 10:09:12 -------------------------------------------------------------------------------- Nutrition Risk Screening Details Patient Name: Date of Service: [name], [name] 12/17/2022 9:30 [name] M Medical Record Number: [id number] Patient Account Number: [id number] Date of Birth/Sex: Treating RN: [birth year] (67 y.o. M) Primary Care Charnita Trudel: [name] Other Clinician: Referring Mechel Haggard: Treating Chela Sutphen/Extender: [name] in Treatment: 0 Height (in): Weight (lbs): 245.6 Body Mass Index (BMI): [name], [name] ([id number]) [id number] Page 4 of 4 Nutrition Risk Screening Items Score Screening NUTRITION RISK SCREEN: I have an illness or condition that made me change the kind and/or amount of food I eat 0 No I eat fewer than two meals per day 0 No I eat few fruits and vegetables, or milk products 0 No I have three or more drinks of beer, liquor or wine almost every day 0 No I have tooth or mouth problems that make it hard for me to eat 2 Yes I don't always have enough money to buy the food I need 0 No I eat alone most of the time 0 No I take three or more different prescribed or over-the-counter drugs [name] day 1 Yes Without wanting to, I have lost or gained 10 pounds in the last six months 0 No I am not always physically able to shop, cook and/or feed myself 2 Yes Nutrition Protocols Good Risk Protocol Moderate Risk Protocol 0 Provide education on nutrition High Risk Proctocol Risk Level: Moderate Risk Score: 5 Electronic Signature(s) Signed: 12/17/2022 4:36:36 PM By: [name] Entered By: [name] on 12/17/2022 10:08:30

## 2022-12-25 DIAGNOSIS — I509 Heart failure, unspecified: Secondary | ICD-10-CM | POA: Diagnosis not present

## 2022-12-25 DIAGNOSIS — G4700 Insomnia, unspecified: Secondary | ICD-10-CM | POA: Diagnosis not present

## 2022-12-25 DIAGNOSIS — J449 Chronic obstructive pulmonary disease, unspecified: Secondary | ICD-10-CM | POA: Diagnosis not present

## 2022-12-25 DIAGNOSIS — I10 Essential (primary) hypertension: Secondary | ICD-10-CM | POA: Diagnosis not present

## 2022-12-25 NOTE — Progress Notes (Signed)
[name] ([id number] Page 1 of 15 Visit Report for 12/17/2022 Chief Complaint Document Details [name], [name] 12/17/2022 9:30 [name] M Medical Record Number: [id number] Patient Account Number: [id number] Date of Birth/Sex: Treating RN: 07-04-1956 (67 y.o. M) Primary Care Provider: [name] Other Clinician: Referring Provider: Treating Provider/Extender: [name] in Treatment: 0 Information Obtained from: Patient Chief Complaint 10/08/2021; bilateral lower extremity wounds Electronic Signature(s) Signed: 12/17/2022 11:07:58 AM By: [name] DO Entered By: [name] on 12/17/2022 10:48:47 -------------------------------------------------------------------------------- Debridement Details [name], [name] 12/17/2022 9:30 [name] M Medical Record Number: [id number] Patient Account Number: [id number] Date of Birth/Sex: Treating RN: 1956/09/17 (67 y.o. [name], Meta.Reding Primary Care Provider: [name] Other Clinician: Referring Provider: Treating Provider/Extender: [name] in Treatment: 0 Debridement Performed for Assessment: Wound #31 Right,Medial Lower Leg Performed By: Physician [name], DO Debridement Type: Debridement Level of Consciousness (Pre-procedure): Awake and Alert Pre-procedure Verification/Time Out Yes - 10:40 Taken: Start Time: 10:41 Pain Control: Lidocaine 4% T opical Solution T Area Debrided (L x W): otal 5 (cm) x 2 (cm) = 10 (cm) Tissue and other material debrided: Viable, Non-Viable, Slough, Subcutaneous, Skin: Dermis , Skin: Epidermis, Slough Level: Skin/Subcutaneous Tissue Debridement Description: Excisional Instrument: Curette Bleeding: Minimum Hemostasis Achieved: Pressure End Time: 10:44 Procedural Pain: 0 Post Procedural Pain: 0 Response to Treatment: Procedure was  tolerated well Level of Consciousness (Post- Awake and Alert procedure): Post Debridement Measurements of Total Wound Length: (cm) 5 Width: (cm) 1.2 Depth: (cm) 0.2 Volume: (cm) 0.942 Character of Wound/Ulcer Post Debridement: Improved Post Procedure Diagnosis Same as Pre-procedure Electronic Signature(s) Signed: 12/17/2022 11:07:58 AM By: [name] DO Signed: 12/17/2022 4:22:41 PM By: [name] RN, BSN Entered By: [name] on 12/17/2022 10:48:01 Siedlecki, Kayleb ([id number] Page 2 of 15 -------------------------------------------------------------------------------- Debridement Details [name], [name] 12/17/2022 9:30 [name] M Medical Record Number: [id number] Patient Account Number: [id number] Date of Birth/Sex: Treating RN: October 30, 1955 (67 y.o. [name], Meta.Reding Primary Care Provider: [name] Other Clinician: Referring Provider: Treating Provider/Extender: [name] in Treatment: 0 Debridement Performed for Assessment: Wound #30 Left,Posterior Lower Leg Performed By: Physician [name], DO Debridement Type: Debridement Severity of Tissue Pre Debridement: Fat layer exposed Level of Consciousness (Pre-procedure): Awake and Alert Pre-procedure Verification/Time Out Yes - 10:40 Taken: Start Time: 10:41 Pain Control: Lidocaine 4% T opical Solution T Area Debrided (L x W): otal 31 (cm) x 17.5 (cm) = 542.5 (cm) Tissue and other material debrided: Viable, Non-Viable, Slough, Subcutaneous, Skin: Dermis , Skin: Epidermis, Slough Level: Skin/Subcutaneous Tissue Debridement Description: Excisional Instrument: Curette Bleeding: Minimum Hemostasis Achieved: Pressure End Time: 10:44 Procedural Pain: 0 Post Procedural Pain: 0 Response to Treatment: Procedure was tolerated well Level of Consciousness (Post- Awake and Alert procedure): Post Debridement Measurements of  Total Wound Length: (cm) 31 Width: (cm) 17.5 Depth: (cm) 0.5 Volume: (cm) 213.039 Character of Wound/Ulcer Post Debridement: Stable Severity of Tissue Post Debridement: Fat layer exposed Post Procedure Diagnosis Same as Pre-procedure Electronic Signature(s) Signed: 12/17/2022 11:07:58 AM By: [name] DO Signed: 12/17/2022 4:22:41 PM By: [name] RN, BSN Entered By: [name] on 12/17/2022 10:48:29 -------------------------------------------------------------------------------- HPI Details [name], [name] 12/17/2022 9:30 [name] M Medical Record Number: [id number] Patient Account Number: [id number] Date of Birth/Sex: Treating RN: 28-Jun-1956 (67 y.o. M) Primary Care Provider: [name] Other Clinician: Referring  Provider: Treating Provider/Extender: [name] in Treatment: 0 History of Present Illness HPI Description: 07/07/15; this is [name] patient who is in hospital on 8/2 through 8/4. He had cellulitis and abscess of predominantly I think the left leg. He received IV antibiotics. Plain x-ray showed no osteomyelitis. An MRI of the left leg did not show osteomyelitis. Cultures showed no predominant organism. His hemoglobin A1c was 9.8. He has [name] history of venous stasis also peripheral vascular disease. He was discharged to [facility] home. He really has extensive ulcerations on the left lateral leg including [name] major wound that communicates both posteriorly and superiorly w. When drainage coming out of 2 smaller areas. He has [name] smaller wound on the posterior medial left leg. He has more predominantly venous insufficiency wounds on predominantly the right medial leg above the ankle the right foot into sections. He has recently been put on Augmentin at the nursing home. Previous ABIs/arterial evaluation showed triphasic waves diffusely he does not have [name] major ischemic issue [name] left lower extremity venous duplex  also exam showed no evidence of [name] DVT on 6/21 07/21/15; the patient arrives with really no major change. Culture I did last time was negative. He has not had [name] follow-up MRI I ordered. The wounds are macerated covered with [name] thick gelatinous surface slough. There is necrotic subcutaneous tissue 08/04/15; the patient arrives with [name] considerable improvement in the majority of his wound area. The area on the left leg now has what looks to be [name] granulated base. Most of the wounds on the left leg required an aggressive surgical debridement to remove nonviable fibrinous eschar and subcutaneous tissue however after debridement most of this looks better. Although I had asked for repeat MRI of the left leg when he first came in here with grossly purulent material coming out of his wounds, it does not appear that this is been done and nor do I actually feel that strongly about it right now. He has severe surrounding Cunanan, Yesenia ([id number]) [phone].pdf Page 3 of 15 venous insufficiency and inflammation. I don't believe he has significant PAD 08/25/15. In general there is still [name] considerable wound area here but with still extensive service adherent slough. The patient will not allow mechanical debridement due to pain. He did not tolerate Medihoney therefore we are left with Santyl for now. She would appear that he has severe surrounding venous stasis. There is no evidence of the infection that may have had something to do with the pathogenesis of these wounds 09/29/15; patient still has substantial wound area on the left leg with [name] cluster of several wounds on the lateral leg confluently on the left leg posteriorly and then [name] substantial wound on the medial leg. On the right leg [name] substantial wound medially and [name] small area on the right lateral foot. All of these underwent [name] substantial surgical debridement with curettes which she tolerated better than he has in the past. This started as  [name] complex cellulitis in the face of chronic venous insufficiency and inflammation. 10/13/15; substantial cluster of wounds on the lateral aspect of his left leg confluently around to the other side. Extensive surgical debridement to remove for redness surface slough nonviable subcutaneous tissue. This is not an improvement. Also substantial wound on the medial right leg which is largely unchanged. The etiology of this was felt to be [name] complex cellulitis in the summer of 2016 in the face of chronic venous insufficiency and inflammation. The patient states  that the wound on the right medial leg has been there for years off and on. 10/20/15; we are able to start [facility] to these extensive wound areas left greater than right on Tuesday after I spoke to the wound care nurse at the facility. He arrives here today for extensive surgical debridement for the wounds on the lateral aspect of his left leg posterior left leg, this is almost circumferential. He has [name] large wound on the medial aspect of his right leg although he finds this too painful for debridement 10/27/15; I continue to bring this patient back for frequent debridement/weekly debridement severe. We have been using Hydrofera Blue. Unfortunately this has not really had any improvement. The debridement surgery difficult and painful for the patient 11/23/15; this patient spent [name] complex hospitalization admitted with acute kidney injury, anemia, cellulitis of the lower extremity, he was felt to have sepsis pathophysiology although his blood cultures were negative. He had plain x-rays of both legs that were negative for osseous abnormalities. He was seen by infectious disease and placed on [name] workup for vasculitis that was negative including [name] biopsy 4 all were consistent with stasis dermatitis. Vital broad- spectrum antibiotics he continued to spike fevers. Urine and chest x-ray were negative Dopplers on admission ruled out [name] DVT All antibiotics were  stopped on . 2/17. Since his return to [facility] skilled nursing facility I believe they have been using Xeroform 12/07/15 the patient arrives today with the area on his right medial leg actually looking quite stable. No debridement. The rest of his extensive wounds on the left lateral left posterior extending into the left medial leg all required extensive debridement. I think this is probably going to need to and up in the hands of plastic surgery. We'll attempt to change him back to [facility]. Arrange consultation with plastic surgery at [facility] for this almost circumferential wound on the left side 12/21/15 right leg covered in surface slough. This was debridement. Left leg extensive wounds all carefully examined. This is almost circumferential on the left side especially on the posterior calf. No debridement is necessary. 01/04/16 the patient has been to [facility] plastic surgery. Unfortunately it doesn't look like they had any of the prior workup on this patient. They're in the middle of vascular workup. It sounds as though they're applying Hydrofera Blue at the nursing home. 01/22/16; the patient has been back to see [facility]. There apparently making plans to possibly do skin grafts over his large venous insufficiency ulcerations. The patient tells me that he goes to hematology in [facility] and variably uses the term platelets red cells and the description of his problem. He is also [name] [employer] account [employer] and will not allow transfusion of blood products. I do not see any major difference in the wound on the right medial leg and circumferentially across the left medial to left lateral leg. Did not attempt to debride these today. Readmission: 05/05/18 on evaluation today patient presents for reevaluation here in our clinic although I have previously seen him in the skilled nursing facility over the past year and [name] half when I was working in the skilled nursing facility realm instead of  covering in the clinic. With that being said during that time we had initiated most recently dressings with Hydrofera Blue Dressing along with the [employer] which had done very well for him. Much better than the Kerlex Coban wraps. With that being said the patient had been doing fairly well and was  making progress when I last saw him. Upon evaluation today it appears that the wounds are actually [name] little bit worse than when I last saw him although definitely not dramatically so. There does not appear to be any evidence of infection which is good news. With that being said he has been tolerating the wraps without complication his left hip still gives him [name] lot of trouble which is his main issue as far as movement is concerned. Unbeknownst to me he has not had venous studies that I can find. He previously told me he had there were arterial studies noted back from 04/27/15 which showed that he had try phasic blood flow throughout and again his test appear to be completely normal as performed by Dr. Creig Hines. With that being said I cannot find where he had venous studies. The patient still states he thinks he did these definitely had no venous intervention at this point. Upon evaluation today it does not appear to me that the patient has any evidence of cellulitis. He does have some chronic venous insufficiency which I think has led to stasis dermatitis but again this is not appearing to be infected at this point. No fevers, chills, nausea, or vomiting noted at this time. READMISSION 06/30/2018 This is [name] now 67 year old man we have had in this clinic at multiple times in the past. Most recently he came in August and was seen by [facility] stone. It is not clear why he did not come back we asked him really did not get [name] straight answer. He is at [facility] skilled facility he is [name] man who has severe chronic venous insufficiency with lymphedema and has had severe wounds on his left greater than right leg. We  had him here in 2016 and 17 ultimately referred him to [facility]. He had arterial studies and venous studies done at [facility] although I am not able to access these records I see they are actually done. He also had multiple biopsies that were negative for malignancy showing changes compatible with chronic inflammation. As I understand things in [place] they are using Iodoflex and Unna boots. I am not really sure how they are getting enough Iodoflex for the large wound area especially on his left calf. Nevertheless the wounds look better than when I saw this in 2017. There were plans for him to have plastic surgery in 2018 and I think he was actually prepped for surgery however he was ultimately denied because the patient was an active smoker. The patient is not systemically unwell. The wounds are painful however given the size especially on the left this is not surprising. ABIs in this clinic were 0.78 on the left and 0.91 on the right 07/13/2018; this is [name] patient with bilateral severe venous ulcers with secondary lymphedema. The wounds are on the right medial calf and [name] substantial part of the left posterior calf and some involvement medially and laterally on the left. We changed him to silver alginate under Unna boot's last time. The wounds actually look fairly satisfactory today. 08/14/2018; this is [name] patient with severe bilateral venous stasis ulcers with secondary lymphedema left greater than right he is at [facility] using silver alginate under Unna boot's I do not think there is much change on this visit versus last time I saw him [name] month ago Readmission: 11/04/18 patient presents today for follow-up concerning his bilateral lower extremity ulcers. His [name] right medial ankle ulcer and [name] left leg that has ulcers  over [name] large proportion of the surface area between the ankle and knee. Unfortunately this does cause some discomfort for the patient although it doesn't seem to be as  uncomfortable as it has been in the past. He is seen today for evaluation after [name] referral by [place] back to the clinic. Unfortunately has [name] lot of Slough noted on the surface of his wounds. He's been treated currently it sounds like with Dakin's soaked gauze and they have not been performing the Unna Boot wrap that was previously recommended. I'm not exactly sure what the reason for the change was. He does have less slough on the surface of the wound but again this is something that is often [name] constant issue for him. Again he's had these wounds for many years. I've known him for two years at least during that time when I was taking care of them in the facility he is [name] had the wounds for several years prior. READMISSION 02/25/2020 Josepha Pigg is [name] now 67 year old man. He has been in our clinic several times before most extensively in October 2016 through May 2017. At that time he had absolutely substantial bilateral lower extremity wounds secondary to chronic venous insufficiency and lymphedema. We ultimately referred him to [facility] he had [name] series of biopsies only showed suggestion of wound secondary to chronic venous insufficiency. He had [name] less extensive stay in our clinic in 2019 and then [name] single visit in February 2020. He is [name] resident at [facility] skilled facility. I think he has had intermittently had in facility wound care. He returns today. They are using silver alginate on the wounds although I am not sure what type of compression. Previously he has favored Unna boots. He is not felt to have an arterial issue. Past medical history; lymphedema and chronic venous insufficiency, diabetes mellitus, hypertension, congestive heart failure We did not do arterial studies today Sneeringer, Kayston ([id number]) [id number].pdf Page 4 of 15 04/27/2020 on evaluation patient appears to be doing really about the same as when I have known him and seen him in the past. He does have  wounds on the right and left legs at this point. Fortunately there is no signs of active infection at this time. 9/2; 1 month follow-up. This is [name] man with severe chronic venous insufficiency and lymphedema. When I first met him he had horrible circumferential bilateral lower extremity ulcers. We have been using silver alginate up until early last month when it was changed to [facility] and Unna boot's more or less says maintenance dressings. Since the last time he was here the facility where he resides [facility] called to report they could no longer do Unna boot so he is apparently only been receiving kerlix Coban the left leg is certainly [name] lot worse with almost circumferential large wounds especially lateral but now medial and posterior as well. He has [name] standard same looking wound on the right medial lower leg. 10/1; absolutely no improvement here. In fact I do not think anything much is changed. The substantial area on his left lateral and left posterior calf is still open may be slightly larger. He has [name] more modest wound on the right medial calf. None of these look any different. He comes in with [name] kerlix Coban wrap this will simply not be adequate. He has too much edema in this leg He also is complaining [name] lot of pain in his left heel area. 11/8; potential wounds on the left lateral and posterior  calf and [name] smaller oval wound in the right medial. We have been using Hydrofera Blue under 4-layer compression. He was using Unna boots still 2 months ago the facility called to say they can no longer place these so we have been using 4-layer compression. The surface area of the area on the left is so large there is very few alternatives to what we can use on this either silver alginate or Hydrofera Blue. He was complaining of pain on the left heel last time I asked for an x-ray this was apparently done although we do not have the result. He is not complaining of pain today. 08/16/2020 on  evaluation today patient is is seen for [name] early visit due to the fact that he apparently is having issues I was told when they called on Monday with [name] MRSA infection that they felt like we needed to see him for because his legs "looked horrible". With that being said based on what I see at this point it does not appear that the patient is actually having [name] terrible infection in fact compared to last time I saw him his legs do not appear to be doing too poorly at all at this time. Nonetheless he has been on doxycycline that is not going to be [name] good option for him based on the culture report that I have for review today which graded as [name] partial report with still several organisms pending as far as identification is concerned we did contact them but again they do not have the final report yet. Either way based on what we see it appears that doxycycline is one of the few medications that will not work for the Citrobacter. Obviously I think that [name] different medication would be [name] good option for him since there is [name] gram-negative organism pending I am likely can I suggest Cipro as the best of backup antibiotic to switch him to at this point. All this was discussed with the patient today. 12/20; not much change in the substantial wound on the left posterior calf and the small area on the right medial. He has [name] new area on the left anterior which looks like [name] wrap injury. Had some thoughts about doing [name] deep tissue culture here although we will put that off for next time. Using silver alginate as [name] primary dressing 2/10; substantial area on the left posterior calf. This measures smaller but still is [name] very large area. He has the area on the right anterior medial which also is [name] fairly large wound but much smaller than not on the left. His edema control is quite good. We have been using silver alginate because of the size of the wounds. 4/7; substantial wound on the left posterior calf and [name] smaller area on  the right medial lower leg. These are very chronic wounds which we have classified is venous. Previously worked up at [facility] for 5 years ago at which time I had sent and will send him over for consideration of extensive skin graft I know they biopsied this many times but he never had plastic surgery. The wound area is much too large for standard size dressings we have been using silver alginate but we did give him [name] good trial last fall of Hydrofera Blue that did not make any difference either. If anything the area on the right medial lower leg over time has gotten [name] lot smaller as has the area on the left although it still quite substantial now.  Readmission 10/08/2021 Mr. Edsell Kalo is [name] 67 year old male with [name] past medical history of insulin-dependent type 2 diabetes with last hemoglobin A1c of 10.2, chronic venous insufficiency, lymphedema and chronic diastolic heart failure that presents to the clinic for bilateral lower extremity wounds. He has been followed in our clinic previously for the past 6 years for these issues. He was last seen in April 2022. He is inconsistent with his appointments in our clinic. These wounds have not healed over the past few years. He is currently using silver alginate to the wound beds and keeping these covered with Kerlix. He currently denies systemic signs of infection. He was recently hospitalized for septic shock secondary to bacteremia from likely chronic leg wounds And is currently on oral antibiotics to be completed this week. 1/30; patient presents for follow-up. He has no issues or complaints today. 2/6; patient presents for follow-up. He reports more discomfort with the increase in compression wraps. He currently denies systemic signs of infection and feels well overall. 2/13; patient presents for follow-up. He reports tolerating the current leg/Coban wrap well. He denies systemic signs of infection and feels fine overall. 2/21; patient presents for  follow-up. He has no issues or complaints today. He tolerated the kirlex/Coban wrap. He denies systemic signs of infection. 3/21; patient presents for follow-up. He has no issues or complaints today. He saw infectious disease, Dr. Megan Salon. Nothing further to do from an infectious disease point. Levaquin was stopped. He currently denies systemic signs of infection. 4/4; patient presents for follow-up. His facility was able to obtain the [facility] antibiotic and has started using this with dressing changes/wrap changes. Patient has no issues or complaints today. He denies signs of infection. 4/18; patient presents for follow-up. He has no issues or complaints today. He denies signs of infection. 5/9; patient presents for follow-up. He has been using [facility] antibiotic under Kerlix/Coban. He denies signs of infection today. 6/6; monthly follow-up. Patient is at [facility] skilled facility. He has been using [facility] probably silver alginate under kerlix Coban. We use Sorbact here. Wound condition has improved 7/11; patient presents for follow-up. We have been using [facility] with Sorbact under Kerlix/Coban. Patient has no issues or complaints today. 8/8; patient presents for follow-up. He is still using [facility] and Sorbact under Kerlix/Coban to his lower extremities bilaterally. He has no issues or complaints today. He denies signs of infection. 9/12; patient presents for follow-up. We have been using [facility] with Sorbact and calcium alginate under Kerlix/Coban to lower extremities bilaterally. He has no issues or complaints today. 10/10; patient presents for follow-up. We continue to use [facility] with Sorbact and calcium alginate under Kerlix/Coban. He has tolerated this well and he has no issues or complaints today. 11/14; patient presents for follow-up. We have been using [facility] with Sorbact and calcium alginate under Kerlix/Coban to the lower extremities bilaterally. He cannot tolerate higher  compression. He states that the facility he resides and changes his dressings 3 times [name] week. He has no issues or complaints today. 12/12; patient presents for follow-up. We have been using [facility] antibiotic ointment with Sorbact and calcium alginate under Kerlix/Coban to the lower extremities bilaterally. He is open to trying [name] higher level compression. His ABIs do not suggest significant arterial insufficiency. 1/8; patient presents for follow up. We have been using [facility] antibiotic ointment with Sorbact and calcium alginate. We increased the compression to 3 layer compression last visit however patient came in with Kerlix/Coban and no sorbact. It is unclear if the  patient's facility has been using [name] higher level Tenpas, Ayansh ([id number]) [phone].pdf Page 5 of 15 compression. We will follow-up with his facility. 12/17/2022 Patient has not been back since January. Patient has bilateral lower extremity wounds. These are the same wounds we have been treating. Facility has been using silver alginate under compression. Patient has no issues or complaints today. Electronic Signature(s) Signed: 12/17/2022 11:07:58 AM By: [name] DO Entered By: [name] on 12/17/2022 10:49:42 -------------------------------------------------------------------------------- Physical Exam Details [name], [name] 12/17/2022 9:30 [name] M Medical Record Number: [id number] Patient Account Number: [id number] Date of Birth/Sex: Treating RN: 11-29-1955 (67 y.o. M) Primary Care Provider: [name] Other Clinician: Referring Provider: Treating Provider/Extender: [name] in Treatment: 0 Constitutional respirations regular, non-labored and within target range for patient.. Cardiovascular 2+ dorsalis pedis/posterior tibialis pulses. Psychiatric pleasant and cooperative. Notes Bilateral lower extremity wounds with  granulation tissue and nonviable tissue, left > right. No signs of surrounding infection. Evidence of lymphedema skin changes bilaterally to the lower extremities. Electronic Signature(s) Signed: 12/17/2022 11:07:58 AM By: [name] DO Entered By: [name] on 12/17/2022 10:50:21 -------------------------------------------------------------------------------- Physician Orders Details [name], [name] 12/17/2022 9:30 [name] M Medical Record Number: [id number] Patient Account Number: [id number] Date of Birth/Sex: Treating RN: Aug 16, 1956 (67 y.o. [name] Primary Care Provider: [name] Other Clinician: Referring Provider: Treating Provider/Extender: [name] in Treatment: 0 Verbal / Phone Orders: No Diagnosis Coding ICD-10 Coding Code Description [phone] Chronic venous hypertension (idiopathic) with ulcer and inflammation of bilateral lower extremity 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ed L97.912 Non-pressure chronic ulcer of unspecified part of right lower leg with fat layer exposed I89.0 Lymphedema, not elsewhere classified E44.0 Moderate protein-calorie malnutrition E11.622 Type 2 diabetes mellitus with other skin ulcer [id number] Chronic diastolic (congestive) heart failure Follow-up Appointments ppointment in: - x6 weeks Dr. Heber Isabel 01/28/2023 1245 room 8 Return [name] Other: - [facility] Anesthetic (In clinic) Topical Lidocaine 4% applied to wound bed Bathing/ Shower/ Hygiene Zeitz, Wenceslaus ([id number]) [phone].pdf Page 6 of 15 May shower with protection but do not get wound dressing(s) wet. Protect dressing(s) with water repellant cover (for example, large plastic bag) or [name] cast cover and may then take shower. Edema Control - Lymphedema / SCD / Other Elevate legs to the level of the heart or above for 30 minutes daily and/or  when sitting for 3-4 times [name] day throughout the day. Wound Treatment Wound #30 - Lower Leg Wound Laterality: Left, Posterior Cleanser: Soap and Water 3 x Per Week/30 Days Discharge Instructions: May shower and wash wound with dial antibacterial soap and water prior to dressing change. Cleanser: Wound Cleanser 3 x Per Week/30 Days Discharge Instructions: Cleanse the wound with wound cleanser prior to applying [name] clean dressing using gauze sponges, not tissue or cotton balls. Peri-Wound Care: Zinc Oxide Ointment 30g tube 3 x Per Week/30 Days Discharge Instructions: Apply Zinc Oxide TO MACERATED AREAS AROUND THE WOUND. Peri-Wound Care: Sween Lotion (Moisturizing lotion) 3 x Per Week/30 Days Discharge Instructions: Apply moisturizing lotion as directed Prim Dressing: Maxorb Extra Ag+ Alginate Dressing, 4x4.75 (in/in) 3 x Per Week/30 Days ary Discharge Instructions: Apply to wound bed as instructed Secondary Dressing: ABD Pad, 8x10 3 x Per Week/30 Days Discharge Instructions: Apply over primary dressing as directed. Compression Wrap: Kerlix Roll 4.5x3.1 (in/yd) 3 x Per Week/30 Days Discharge Instructions: Apply  Kerlix and Coban compression as directed. Compression Wrap: Coban Self-Adherent Wrap 4x5 (in/yd) 3 x Per Week/30 Days Discharge Instructions: Apply over Kerlix as directed. Wound #31 - Lower Leg Wound Laterality: Right, Medial Cleanser: Soap and Water 3 x Per Week/30 Days Discharge Instructions: May shower and wash wound with dial antibacterial soap and water prior to dressing change. Cleanser: Wound Cleanser 3 x Per Week/30 Days Discharge Instructions: Cleanse the wound with wound cleanser prior to applying [name] clean dressing using gauze sponges, not tissue or cotton balls. Peri-Wound Care: Zinc Oxide Ointment 30g tube 3 x Per Week/30 Days Discharge Instructions: Apply Zinc Oxide TO MACERATED AREAS AROUND THE WOUND. Peri-Wound Care: Sween Lotion (Moisturizing lotion) 3 x Per Week/30  Days Discharge Instructions: Apply moisturizing lotion as directed Prim Dressing: Maxorb Extra Ag+ Alginate Dressing, 4x4.75 (in/in) 3 x Per Week/30 Days ary Discharge Instructions: Apply to wound bed as instructed Secondary Dressing: ABD Pad, 8x10 3 x Per Week/30 Days Discharge Instructions: Apply over primary dressing as directed. Compression Wrap: Kerlix Roll 4.5x3.1 (in/yd) 3 x Per Week/30 Days Discharge Instructions: Apply Kerlix and Coban compression as directed. Compression Wrap: Coban Self-Adherent Wrap 4x5 (in/yd) 3 x Per Week/30 Days Discharge Instructions: Apply over Kerlix as directed. Patient Medications llergies: Sulfa (Sulfonamide Antibiotics) [name] Notifications Medication Indication Start End applied for 12/17/2022 lidocaine debridements by provider. DOSE topical 4 % gel - gel topical once daily Electronic Signature(s) Signed: 12/17/2022 11:07:58 AM By: [name] DO Entered By: [name] on 12/17/2022 11:07:37 Loughney, Teshaun ([id number] Page 7 of 15 -------------------------------------------------------------------------------- Problem List Details [name], [name] 12/17/2022 9:30 [name] M Medical Record Number: [id number] Patient Account Number: [id number] Date of Birth/Sex: Treating RN: 1955-10-13 (67 y.o. M) Primary Care Provider: [name] Other Clinician: Referring Provider: Treating Provider/Extender: [name] in Treatment: 0 Active Problems ICD-10 Encounter Code Description Active Date MDM Diagnosis I87.333 Chronic venous hypertension (idiopathic) with ulcer and inflammation of 12/17/2022 No Yes bilateral lower extremity 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ed3/26/2024 No Yes L97.912 Non-pressure chronic ulcer of unspecified part of right lower leg with fat layer 12/17/2022 No Yes exposed I89.0  Lymphedema, not elsewhere classified 12/17/2022 No Yes E44.0 Moderate protein-calorie malnutrition 12/17/2022 No Yes E11.622 Type 2 diabetes mellitus with other skin ulcer 12/17/2022 No Yes [id number] Chronic diastolic (congestive) heart failure 12/17/2022 No Yes Inactive Problems Resolved Problems Electronic Signature(s) Signed: 12/17/2022 11:07:58 AM By: [name] DO Previous Signature: 12/17/2022 10:32:58 AM Version By: [name] DO Entered By: [name] on 12/17/2022 10:48:30 -------------------------------------------------------------------------------- Progress Note Details [name], [name] 12/17/2022 9:30 [name] M Medical Record Number: [id number] Patient Account Number: [id number] Date of Birth/Sex: Treating RN: 01-Nov-1955 (67 y.o. M) Primary Care Provider: [name] Other Clinician: Referring Provider: Treating Provider/Extender: [name] in Treatment: 0 Subjective Chief Complaint Information obtained from Patient 10/08/2021; bilateral lower extremity wounds History of Present Illness (HPI) 07/07/15; this is [name] patient who is in hospital on 8/2 through 8/4. He had cellulitis and abscess of predominantly I think the left leg. He received IV antibiotics. Lesh, Demond ([id number] Page 8 of 15 Plain x-ray showed no osteomyelitis. An MRI of the left leg did not show osteomyelitis. Cultures showed no predominant organism. His hemoglobin A1c was 9.8. He has [name] history of venous stasis also peripheral vascular disease. He was discharged to [facility]. He  really has extensive ulcerations on the left lateral leg including [name] major wound that communicates both posteriorly and superiorly w. When drainage coming out of 2 smaller areas. He has [name] smaller wound on the posterior medial left leg. He has more predominantly venous insufficiency wounds on predominantly the right  medial leg above the ankle the right foot into sections. He has recently been put on Augmentin at the nursing home. Previous ABIs/arterial evaluation showed triphasic waves diffusely he does not have [name] major ischemic issue [name] left lower extremity venous duplex also exam showed no evidence of [name] DVT on 6/21 07/21/15; the patient arrives with really no major change. Culture I did last time was negative. He has not had [name] follow-up MRI I ordered. The wounds are macerated covered with [name] thick gelatinous surface slough. There is necrotic subcutaneous tissue 08/04/15; the patient arrives with [name] considerable improvement in the majority of his wound area. The area on the left leg now has what looks to be [name] granulated base. Most of the wounds on the left leg required an aggressive surgical debridement to remove nonviable fibrinous eschar and subcutaneous tissue however after debridement most of this looks better. Although I had asked for repeat MRI of the left leg when he first came in here with grossly purulent material coming out of his wounds, it does not appear that this is been done and nor do I actually feel that strongly about it right now. He has severe surrounding venous insufficiency and inflammation. I don't believe he has significant PAD 08/25/15. In general there is still [name] considerable wound area here but with still extensive service adherent slough. The patient will not allow mechanical debridement due to pain. He did not tolerate Medihoney therefore we are left with Santyl for now. She would appear that he has severe surrounding venous stasis. There is no evidence of the infection that may have had something to do with the pathogenesis of these wounds 09/29/15; patient still has substantial wound area on the left leg with [name] cluster of several wounds on the lateral leg confluently on the left leg posteriorly and then [name] substantial wound on the medial leg. On the right leg [name] substantial wound medially  and [name] small area on the right lateral foot. All of these underwent [name] substantial surgical debridement with curettes which she tolerated better than he has in the past. This started as [name] complex cellulitis in the face of chronic venous insufficiency and inflammation. 10/13/15; substantial cluster of wounds on the lateral aspect of his left leg confluently around to the other side. Extensive surgical debridement to remove for redness surface slough nonviable subcutaneous tissue. This is not an improvement. Also substantial wound on the medial right leg which is largely unchanged. The etiology of this was felt to be [name] complex cellulitis in the summer of 2016 in the face of chronic venous insufficiency and inflammation. The patient states that the wound on the right medial leg has been there for years off and on. 10/20/15; we are able to start [facility] to these extensive wound areas left greater than right on Tuesday after I spoke to the wound care nurse at the facility. He arrives here today for extensive surgical debridement for the wounds on the lateral aspect of his left leg posterior left leg, this is almost circumferential. He has [name] large wound on the medial aspect of his right leg although he finds this too painful for debridement 10/27/15; I continue to bring this  patient back for frequent debridement/weekly debridement severe. We have been using Hydrofera Blue. Unfortunately this has not really had any improvement. The debridement surgery difficult and painful for the patient 11/23/15; this patient spent [name] complex hospitalization admitted with acute kidney injury, anemia, cellulitis of the lower extremity, he was felt to have sepsis pathophysiology although his blood cultures were negative. He had plain x-rays of both legs that were negative for osseous abnormalities. He was seen by infectious disease and placed on [name] workup for vasculitis that was negative including [name] biopsy o4 all were  consistent with stasis dermatitis. Vital broad-spectrum antibiotics he continued to spike fevers. Urine and chest x-ray were negative Dopplers on admission ruled out [name] DVT All antibiotics were stopped on 2/17. . Since his return to [facility] skilled nursing facility I believe they have been using Xeroform 12/07/15 the patient arrives today with the area on his right medial leg actually looking quite stable. No debridement. The rest of his extensive wounds on the left lateral left posterior extending into the left medial leg all required extensive debridement. I think this is probably going to need to and up in the hands of plastic surgery. We'll attempt to change him back to [facility]. Arrange consultation with plastic surgery at [facility] for this almost circumferential wound on the left side 12/21/15 right leg covered in surface slough. This was debridement. Left leg extensive wounds all carefully examined. This is almost circumferential on the left side especially on the posterior calf. No debridement is necessary. 01/04/16 the patient has been to [facility] plastic surgery. Unfortunately it doesn't look like they had any of the prior workup on this patient. They're in the middle of vascular workup. It sounds as though they're applying Hydrofera Blue at the nursing home. 01/22/16; the patient has been back to see [facility]. There apparently making plans to possibly do skin grafts over his large venous insufficiency ulcerations. The patient tells me that he goes to hematology in [facility] and variably uses the term platelets red cells and the description of his problem. He is also [name] [employer] account [employer] and will not allow transfusion of blood products. I do not see any major difference in the wound on the right medial leg and circumferentially across the left medial to left lateral leg. Did not attempt to debride these today. Readmission: 05/05/18 on evaluation today patient presents for  reevaluation here in our clinic although I have previously seen him in the skilled nursing facility over the past year and [name] half when I was working in the skilled nursing facility realm instead of covering in the clinic. With that being said during that time we had initiated most recently dressings with Hydrofera Blue Dressing along with the [employer] which had done very well for him. Much better than the Kerlex Coban wraps. With that being said the patient had been doing fairly well and was making progress when I last saw him. Upon evaluation today it appears that the wounds are actually [name] little bit worse than when I last saw him although definitely not dramatically so. There does not appear to be any evidence of infection which is good news. With that being said he has been tolerating the wraps without complication his left hip still gives him [name] lot of trouble which is his main issue as far as movement is concerned. Unbeknownst to me he has not had venous studies that I can find. He previously told me he had there were  arterial studies noted back from 04/27/15 which showed that he had try phasic blood flow throughout and again his test appear to be completely normal as performed by Dr. Creig Hines. With that being said I cannot find where he had venous studies. The patient still states he thinks he did these definitely had no venous intervention at this point. Upon evaluation today it does not appear to me that the patient has any evidence of cellulitis. He does have some chronic venous insufficiency which I think has led to stasis dermatitis but again this is not appearing to be infected at this point. No fevers, chills, nausea, or vomiting noted at this time. READMISSION 06/30/2018 This is [name] now 67 year old man we have had in this clinic at multiple times in the past. Most recently he came in August and was seen by [facility]. It is not clear why he did not come back we asked him really did not  get [name] straight answer. He is at [facility] skilled facility he is [name] man who has severe chronic venous insufficiency with lymphedema and has had severe wounds on his left greater than right leg. We had him here in 2016 and 17 ultimately referred him to [facility]. He had arterial studies and venous studies done at [facility] although I am not able to access these records I see they are actually done. He also had multiple biopsies that were negative for malignancy showing changes compatible with chronic inflammation. As I understand things in [place] they are using Iodoflex and Unna boots. I am not really sure how they are getting enough Iodoflex for the large wound area especially on his left calf. Nevertheless the wounds look better than when I saw this in 2017. There were plans for him to have plastic surgery in 2018 and I think he was actually prepped for surgery however he was ultimately denied because the patient was an active smoker. The patient is not systemically unwell. The wounds are painful however given the size especially on the left this is not surprising. ABIs in this clinic were 0.78 on the left and 0.91 on the right 07/13/2018; this is [name] patient with bilateral severe venous ulcers with secondary lymphedema. The wounds are on the right medial calf and [name] substantial part of the left posterior calf and some involvement medially and laterally on the left. We changed him to silver alginate under Unna boot's last time. The wounds actually look fairly satisfactory today. 08/14/2018; this is [name] patient with severe bilateral venous stasis ulcers with secondary lymphedema left greater than right he is at [facility] using silver alginate under Unna boot's I do not think there is much change on this visit versus last time I saw him [name] month ago Readmission: 11/04/18 patient presents today for follow-up concerning his bilateral lower extremity ulcers. His [name] right medial ankle ulcer and [name] left  leg that has ulcers over [name] large proportion of the surface area between the ankle and knee. Unfortunately this does cause some discomfort for the patient although it doesn't seem to be as uncomfortable as it has been in the past. He is seen today for evaluation after [name] referral by [place] back to the clinic. Unfortunately has [name] lot of Slough noted on the surface of his wounds. He's been treated currently it sounds like with Dakin's soaked gauze and they have not been performing the Unna Boot wrap that was previously recommended. I'm not exactly sure what the reason for the  change was. He does have less slough on the surface of the wound but again this is something that is often [name] constant issue for him. Again he's had these wounds for many years. I've known him for two years at least during that time when I was taking care of them in the facility he is [name] had the wounds for several years prior. READMISSION Lozada, Blayton ([id number] Page 9 of 15 02/25/2020 Josepha Pigg is [name] now 68 year old man. He has been in our clinic several times before most extensively in October 2016 through May 2017. At that time he had absolutely substantial bilateral lower extremity wounds secondary to chronic venous insufficiency and lymphedema. We ultimately referred him to [facility] he had [name] series of biopsies only showed suggestion of wound secondary to chronic venous insufficiency. He had [name] less extensive stay in our clinic in 2019 and then [name] single visit in February 2020. He is [name] resident at [facility] skilled facility. I think he has had intermittently had in facility wound care. He returns today. They are using silver alginate on the wounds although I am not sure what type of compression. Previously he has favored Unna boots. He is not felt to have an arterial issue. Past medical history; lymphedema and chronic venous insufficiency, diabetes mellitus, hypertension, congestive  heart failure We did not do arterial studies today 04/27/2020 on evaluation patient appears to be doing really about the same as when I have known him and seen him in the past. He does have wounds on the right and left legs at this point. Fortunately there is no signs of active infection at this time. 9/2; 1 month follow-up. This is [name] man with severe chronic venous insufficiency and lymphedema. When I first met him he had horrible circumferential bilateral lower extremity ulcers. We have been using silver alginate up until early last month when it was changed to [facility] and Unna boot's more or less says maintenance dressings. Since the last time he was here the facility where he resides [facility] called to report they could no longer do Unna boot so he is apparently only been receiving kerlix Coban the left leg is certainly [name] lot worse with almost circumferential large wounds especially lateral but now medial and posterior as well. He has [name] standard same looking wound on the right medial lower leg. 10/1; absolutely no improvement here. In fact I do not think anything much is changed. The substantial area on his left lateral and left posterior calf is still open may be slightly larger. He has [name] more modest wound on the right medial calf. None of these look any different. He comes in with [name] kerlix Coban wrap this will simply not be adequate. He has too much edema in this leg He also is complaining [name] lot of pain in his left heel area. 11/8; potential wounds on the left lateral and posterior calf and [name] smaller oval wound in the right medial. We have been using Hydrofera Blue under 4-layer compression. He was using Unna boots still 2 months ago the facility called to say they can no longer place these so we have been using 4-layer compression. The surface area of the area on the left is so large there is very few alternatives to what we can use on this either silver alginate or Hydrofera Blue. He  was complaining of pain on the left heel last time I asked for an x-ray this was apparently done although we do  not have the result. He is not complaining of pain today. 08/16/2020 on evaluation today patient is is seen for [name] early visit due to the fact that he apparently is having issues I was told when they called on Monday with [name] MRSA infection that they felt like we needed to see him for because his legs "looked horrible". With that being said based on what I see at this point it does not appear that the patient is actually having [name] terrible infection in fact compared to last time I saw him his legs do not appear to be doing too poorly at all at this time. Nonetheless he has been on doxycycline that is not going to be [name] good option for him based on the culture report that I have for review today which graded as [name] partial report with still several organisms pending as far as identification is concerned we did contact them but again they do not have the final report yet. Either way based on what we see it appears that doxycycline is one of the few medications that will not work for the Citrobacter. Obviously I think that [name] different medication would be [name] good option for him since there is [name] gram-negative organism pending I am likely can I suggest Cipro as the best of backup antibiotic to switch him to at this point. All this was discussed with the patient today. 12/20; not much change in the substantial wound on the left posterior calf and the small area on the right medial. He has [name] new area on the left anterior which looks like [name] wrap injury. Had some thoughts about doing [name] deep tissue culture here although we will put that off for next time. Using silver alginate as [name] primary dressing 2/10; substantial area on the left posterior calf. This measures smaller but still is [name] very large area. He has the area on the right anterior medial which also is [name] fairly large wound but much smaller than not on  the left. His edema control is quite good. We have been using silver alginate because of the size of the wounds. 4/7; substantial wound on the left posterior calf and [name] smaller area on the right medial lower leg. These are very chronic wounds which we have classified is venous. Previously worked up at [facility] for 5 years ago at which time I had sent and will send him over for consideration of extensive skin graft I know they biopsied this many times but he never had plastic surgery. The wound area is much too large for standard size dressings we have been using silver alginate but we did give him [name] good trial last fall of Hydrofera Blue that did not make any difference either. If anything the area on the right medial lower leg over time has gotten [name] lot smaller as has the area on the left although it still quite substantial now. Readmission 10/08/2021 Mr. [name] is [name] 67 year old male with [name] past medical history of insulin-dependent type 2 diabetes with last hemoglobin A1c of 10.2, chronic venous insufficiency, lymphedema and chronic diastolic heart failure that presents to the clinic for bilateral lower extremity wounds. He has been followed in our clinic previously for the past 6 years for these issues. He was last seen in April 2022. He is inconsistent with his appointments in our clinic. These wounds have not healed over the past few years. He is currently using silver alginate to the wound beds and keeping these covered  with Kerlix. He currently denies systemic signs of infection. He was recently hospitalized for septic shock secondary to bacteremia from likely chronic leg wounds And is currently on oral antibiotics to be completed this week. 1/30; patient presents for follow-up. He has no issues or complaints today. 2/6; patient presents for follow-up. He reports more discomfort with the increase in compression wraps. He currently denies systemic signs of infection and feels well  overall. 2/13; patient presents for follow-up. He reports tolerating the current leg/Coban wrap well. He denies systemic signs of infection and feels fine overall. 2/21; patient presents for follow-up. He has no issues or complaints today. He tolerated the kirlex/Coban wrap. He denies systemic signs of infection. 3/21; patient presents for follow-up. He has no issues or complaints today. He saw infectious disease, Dr. Megan Salon. Nothing further to do from an infectious disease point. Levaquin was stopped. He currently denies systemic signs of infection. 4/4; patient presents for follow-up. His facility was able to obtain the [facility] antibiotic and has started using this with dressing changes/wrap changes. Patient has no issues or complaints today. He denies signs of infection. 4/18; patient presents for follow-up. He has no issues or complaints today. He denies signs of infection. 5/9; patient presents for follow-up. He has been using [facility]stone antibiotic under Kerlix/Coban. He denies signs of infection today. 6/6; monthly follow-up. Patient is at [facility] skilled facility. He has been using Keystone probably silver alginate under kerlix Coban. We use Sorbact here. Wound condition has improved 7/11; patient presents for follow-up. We have been using Keystone with Sorbact under Kerlix/Coban. Patient has no issues or complaints today. 8/8; patient presents for follow-up. He is still using Keystone and Sorbact under Kerlix/Coban to his lower extremities bilaterally. He has no issues or complaints today. He denies signs of infection. 9/12; patient presents for follow-up. We have been using Keystone with Sorbact and calcium alginate under Kerlix/Coban to lower extremities bilaterally. He has no issues or complaints today. Mcnease, Chidiebere ([id number] Page 10 of 15 10/10; patient presents for follow-up. We continue to use [facility] with Sorbact and calcium alginate  under Kerlix/Coban. He has tolerated this well and he has no issues or complaints today. 11/14; patient presents for follow-up. We have been using Keystone with Sorbact and calcium alginate under Kerlix/Coban to the lower extremities bilaterally. He cannot tolerate higher compression. He states that the facility he resides and changes his dressings 3 times [name] week. He has no issues or complaints today. 12/12; patient presents for follow-up. We have been using Keystone antibiotic ointment with Sorbact and calcium alginate under Kerlix/Coban to the lower extremities bilaterally. He is open to trying [name] higher level compression. His ABIs do not suggest significant arterial insufficiency. 1/8; patient presents for follow up. We have been using Keystone antibiotic ointment with Sorbact and calcium alginate. We increased the compression to 3 layer compression last visit however patient came in with Kerlix/Coban and no sorbact. It is unclear if the patient's facility has been using [name] higher level compression. We will follow-up with his facility. 12/17/2022 Patient has not been back since January. Patient has bilateral lower extremity wounds. These are the same wounds we have been treating. Facility has been using silver alginate under compression. Patient has no issues or complaints today. Patient History Information obtained from Patient, Chart. Allergies Sulfa (Sulfonamide Antibiotics) (Severity: Severe, Reaction: sweating,chils, body aches) Family History Diabetes - Mother,Father,Siblings, Hypertension - Mother,Father, No family history of Cancer, Heart Disease, Hereditary Spherocytosis, Kidney Disease, Lung Disease, Seizures,  Stroke, Thyroid Problems, Tuberculosis. Social History Current every day smoker - 4 cig per day, Marital Status - Separated, Alcohol Use - Never, Drug Use - Prior History - cocaine, [place] quit 2005, Caffeine Use - Moderate. Medical History Eyes Denies history of Cataracts,  Glaucoma, Optic Neuritis Ear/Nose/Mouth/Throat Denies history of Chronic sinus problems/congestion, Middle ear problems Hematologic/Lymphatic Patient has history of Anemia - iron deficiency Denies history of Hemophilia, Human Immunodeficiency Virus, Lymphedema, Sickle Cell Disease Respiratory Patient has history of Chronic Obstructive Pulmonary Disease (COPD), Sleep Apnea Denies history of Aspiration, Asthma, Pneumothorax, Tuberculosis Cardiovascular Patient has history of Congestive Heart Failure, Coronary Artery Disease, Hypertension, Peripheral Venous Disease Denies history of Angina, Arrhythmia, Deep Vein Thrombosis, Hypotension, Myocardial Infarction, Peripheral Arterial Disease, Phlebitis, Vasculitis Gastrointestinal Denies history of Cirrhosis , Colitis, Crohnoos, Hepatitis [name], Hepatitis B, Hepatitis C Endocrine Patient has history of Type II Diabetes - uncontrolled Denies history of Type I Diabetes Genitourinary Denies history of End Stage Renal Disease Immunological Denies history of Lupus Erythematosus, Raynaudoos, Scleroderma Integumentary (Skin) Denies history of History of Burn Musculoskeletal Patient has history of Osteoarthritis Denies history of Gout, Rheumatoid Arthritis, Osteomyelitis Neurologic Patient has history of Neuropathy Denies history of Dementia, Quadriplegia, Paraplegia, Seizure Disorder Oncologic Denies history of Received Chemotherapy, Received Radiation Psychiatric Denies history of Anorexia/bulimia, Confinement Anxiety Hospitalization/Surgery History - inguinal hernia repair with mesh. - right shoulder rotator cuff repair. - toe nail excision. - Sepsis 09/03/21-09/10/22. Medical [name] Surgical History Notes nd Gastrointestinal GERD Musculoskeletal degenerative righ thip Psychiatric depression Review of Systems (ROS) Constitutional Symptoms (General Health) Denies complaints or symptoms of Fatigue, Fever, Chills, Marked Weight  Change. Eyes Denies complaints or symptoms of Dry Eyes, Vision Changes, Glasses / Contacts. Ear/Nose/Mouth/Throat Denies complaints or symptoms of Chronic sinus problems or rhinitis. Cardiovascular Denies complaints or symptoms of Chest pain. Gastrointestinal Denies complaints or symptoms of Frequent diarrhea, Nausea, Vomiting. Integumentary (Skin) [name], [name] ([id number] Page 11 of 15 Complains or has symptoms of Wounds - BLL. Musculoskeletal Denies complaints or symptoms of Muscle Pain, Muscle Weakness. Neurologic Denies complaints or symptoms of Numbness/parasthesias. Psychiatric Denies complaints or symptoms of Claustrophobia. Objective Constitutional respirations regular, non-labored and within target range for patient.. Vitals Time Taken: 9:59 AM, Weight: 245.6 lbs, Temperature: 97.9 F, Pulse: 64 bpm, Respiratory Rate: 18 breaths/min, Blood Pressure: 119/70 mmHg. Cardiovascular 2+ dorsalis pedis/posterior tibialis pulses. Psychiatric pleasant and cooperative. General Notes: Bilateral lower extremity wounds with granulation tissue and nonviable tissue, left > right. No signs of surrounding infection. Evidence of lymphedema skin changes bilaterally to the lower extremities. Integumentary (Hair, Skin) Wound #30 status is Open. Original cause of wound was Gradually Appeared. The date acquired was: 09/23/2020. The wound is located on the Left,Posterior Lower Leg. The wound measures 31cm length x 17.5cm width x 0.2cm depth; 426.079cm^2 area and 85.216cm^3 volume. There is Fat Layer (Subcutaneous Tissue) exposed. There is small (1-33%) red granulation within the wound bed. There is [name] large (67-100%) amount of necrotic tissue within the wound bed including Adherent Slough. The periwound skin appearance did not exhibit: Callus, Crepitus, Excoriation, Induration, Rash, Scarring, Dry/Scaly, Maceration, Atrophie Blanche, Cyanosis, Ecchymosis,  Hemosiderin Staining, Mottled, Pallor, Rubor, Erythema. Periwound temperature was noted as No Abnormality. Wound #31 status is Open. Original cause of wound was Gradually Appeared. The date acquired was: 10/31/2022. The wound is located on the Right,Medial Lower Leg. The wound measures 5cm length x 1.2cm width x 0.2cm depth; 4.712cm^2 area and 0.942cm^3 volume. There is Fat Layer (Subcutaneous Tissue) exposed. There is no tunneling  or undermining noted. There is [name] medium amount of serosanguineous drainage noted. The wound margin is distinct with the outline attached to the wound base. There is medium (34-66%) red granulation within the wound bed. There is [name] medium (34-66%) amount of necrotic tissue within the wound bed including Adherent Slough. The periwound skin appearance exhibited: Dry/Scaly, Hemosiderin Staining. The periwound skin appearance did not exhibit: Callus, Crepitus, Excoriation, Induration, Rash, Scarring, Maceration, Atrophie Blanche, Cyanosis, Ecchymosis, Mottled, Pallor, Rubor, Erythema. Assessment Active Problems ICD-10 Chronic venous hypertension (idiopathic) with ulcer and inflammation of bilateral lower extremity Non-pressure chronic ulcer of other part of left lower leg with fat layer exposed Non-pressure chronic ulcer of unspecified part of right lower leg with fat layer exposed Lymphedema, not elsewhere classified Moderate protein-calorie malnutrition Type 2 diabetes mellitus with other skin ulcer Chronic diastolic (congestive) heart failure Patient has significant bilateral lower extremity wounds left greater than right. These are secondary to venous insufficiency. I debrided nonviable tissue. I recommended continuing the course with silver alginate under Kerlix/Coban to the lower extremities bilaterally. Follow-up in 6 weeks. Patient resides in [name] facility that changes the dressings. Procedures Wound #30 Pre-procedure diagnosis of Wound #30 is [name] Venous Leg Ulcer located  on the Left,Posterior Lower Leg .Severity of Tissue Pre Debridement is: Fat layer exposed. There was [name] Excisional Skin/Subcutaneous Tissue Debridement with [name] total area of 542.5 sq cm performed by [name], DO. With the following instrument(s): Curette to remove Viable and Non-Viable tissue/material. Material removed includes Subcutaneous Tissue, Slough, Skin: Dermis, and Skin: Epidermis after achieving pain control using Lidocaine 4% Topical Solution. [name] time out was conducted at 10:40, prior to the start of the procedure. [name] Minimum amount of bleeding was controlled with Pressure. The procedure was tolerated well with [name] pain level of 0 throughout and [name] pain level of 0 following the procedure. Post Debridement Measurements: 31cm length x 17.5cm width x 0.5cm depth; 213.039cm^3 volume. Character of Wound/Ulcer Post Debridement is stable. Severity of Tissue Post Debridement is: Fat layer exposed. Post procedure Diagnosis Wound #30: Same as Pre-Procedure Wound #31 Pre-procedure diagnosis of Wound #31 is [name] T be determined located on the Right,Medial Lower Leg . There was [name] Excisional Skin/Subcutaneous Tissue o Franca, Idan ([id number].pdf Page 12 of 15 Debridement with [name] total area of 10 sq cm performed by [name], DO. With the following instrument(s): Curette to remove Viable and Non-Viable tissue/material. Material removed includes Subcutaneous Tissue, Slough, Skin: Dermis, and Skin: Epidermis after achieving pain control using Lidocaine 4% T opical Solution. [name] time out was conducted at 10:40, prior to the start of the procedure. [name] Minimum amount of bleeding was controlled with Pressure. The procedure was tolerated well with [name] pain level of 0 throughout and [name] pain level of 0 following the procedure. Post Debridement Measurements: 5cm length x 1.2cm width x 0.2cm depth; 0.942cm^3 volume. Character of Wound/Ulcer Post Debridement is improved. Post  procedure Diagnosis Wound #31: Same as Pre-Procedure Plan Follow-up Appointments: Return Appointment in: - x6 weeks Dr. Heber [place] 01/28/2023 [place] room 8 Other: - [name] Anesthetic: (In clinic) Topical Lidocaine 4% applied to wound bed Bathing/ Shower/ Hygiene: May shower with protection but do not get wound dressing(s) wet. Protect dressing(s) with water repellant cover (for example, large plastic bag) or [name] cast cover and may then take shower. Edema Control - Lymphedema / SCD / Other: Elevate legs to the level of the heart or above for 30 minutes daily and/or when sitting for 3-4 times  [name] day throughout the day. The following medication(s) was prescribed: lidocaine topical 4 % gel gel topical once daily for applied for debridements by provider. was prescribed at facility WOUND #30: - Lower Leg Wound Laterality: Left, Posterior Cleanser: Soap and Water 3 x Per Week/30 Days Discharge Instructions: May shower and wash wound with dial antibacterial soap and water prior to dressing change. Cleanser: Wound Cleanser 3 x Per Week/30 Days Discharge Instructions: Cleanse the wound with wound cleanser prior to applying [name] clean dressing using gauze sponges, not tissue or cotton balls. Peri-Wound Care: Zinc Oxide Ointment 30g tube 3 x Per Week/30 Days Discharge Instructions: Apply Zinc Oxide TO MACERATED AREAS AROUND THE WOUND. Peri-Wound Care: Sween Lotion (Moisturizing lotion) 3 x Per Week/30 Days Discharge Instructions: Apply moisturizing lotion as directed Prim Dressing: Maxorb Extra Ag+ Alginate Dressing, 4x4.75 (in/in) 3 x Per Week/30 Days ary Discharge Instructions: Apply to wound bed as instructed Secondary Dressing: ABD Pad, 8x10 3 x Per Week/30 Days Discharge Instructions: Apply over primary dressing as directed. Com pression Wrap: Kerlix Roll 4.5x3.1 (in/yd) 3 x Per Week/30 Days Discharge Instructions: Apply Kerlix and Coban compression as directed. Com pression Wrap: Coban  Self-Adherent Wrap 4x5 (in/yd) 3 x Per Week/30 Days Discharge Instructions: Apply over Kerlix as directed. WOUND #31: - Lower Leg Wound Laterality: Right, Medial Cleanser: Soap and Water 3 x Per Week/30 Days Discharge Instructions: May shower and wash wound with dial antibacterial soap and water prior to dressing change. Cleanser: Wound Cleanser 3 x Per Week/30 Days Discharge Instructions: Cleanse the wound with wound cleanser prior to applying [name] clean dressing using gauze sponges, not tissue or cotton balls. Peri-Wound Care: Zinc Oxide Ointment 30g tube 3 x Per Week/30 Days Discharge Instructions: Apply Zinc Oxide TO MACERATED AREAS AROUND THE WOUND. Peri-Wound Care: Sween Lotion (Moisturizing lotion) 3 x Per Week/30 Days Discharge Instructions: Apply moisturizing lotion as directed Prim Dressing: Maxorb Extra Ag+ Alginate Dressing, 4x4.75 (in/in) 3 x Per Week/30 Days ary Discharge Instructions: Apply to wound bed as instructed Secondary Dressing: ABD Pad, 8x10 3 x Per Week/30 Days Discharge Instructions: Apply over primary dressing as directed. Com pression Wrap: Kerlix Roll 4.5x3.1 (in/yd) 3 x Per Week/30 Days Discharge Instructions: Apply Kerlix and Coban compression as directed. Com pression Wrap: Coban Self-Adherent Wrap 4x5 (in/yd) 3 x Per Week/30 Days Discharge Instructions: Apply over Kerlix as directed. 1. In office sharp debridement 2. Silver alginate under Kerlix/Coban to the lower extremities bilaterally 3. Follow-up in 6 weeks Electronic Signature(s) Signed: 12/17/2022 11:07:58 AM By: [name] DO Entered By: [name] on 12/17/2022 11:07:45 -------------------------------------------------------------------------------- HxROS Details [name], [name] 12/17/2022 9:30 [name] M Medical Record Number: [id number] Patient Account Number: [id number] Date of Birth/Sex: Treating RN: 26-Apr-1956 (67 y.o. [name] Primary Care  Provider: [name] Other Clinician: Referring Provider: Treating Provider/Extender: [name] in Treatment: 0 Marinello, Harim ([id number]) [phone].pdf Page 13 of 15 Information Obtained From Patient Chart Constitutional Symptoms (General Health) Complaints and Symptoms: Negative for: Fatigue; Fever; Chills; Marked Weight Change Eyes Complaints and Symptoms: Negative for: Dry Eyes; Vision Changes; Glasses / Contacts Medical History: Negative for: Cataracts; Glaucoma; Optic Neuritis Ear/Nose/Mouth/Throat Complaints and Symptoms: Negative for: Chronic sinus problems or rhinitis Medical History: Negative for: Chronic sinus problems/congestion; Middle ear problems Cardiovascular Complaints and Symptoms: Negative for: Chest pain Medical History: Positive for: Congestive Heart Failure; Coronary Artery Disease; Hypertension; Peripheral Venous Disease Negative for: Angina; Arrhythmia; Deep Vein Thrombosis; Hypotension; Myocardial Infarction;  Peripheral Arterial Disease; Phlebitis; Vasculitis Gastrointestinal Complaints and Symptoms: Negative for: Frequent diarrhea; Nausea; Vomiting Medical History: Negative for: Cirrhosis ; Colitis; Crohns; Hepatitis [name]; Hepatitis B; Hepatitis C Past Medical History Notes: GERD Integumentary (Skin) Complaints and Symptoms: Positive for: Wounds - BLL Medical History: Negative for: History of Burn Musculoskeletal Complaints and Symptoms: Negative for: Muscle Pain; Muscle Weakness Medical History: Positive for: Osteoarthritis Negative for: Gout; Rheumatoid Arthritis; Osteomyelitis Past Medical History Notes: degenerative righ thip Neurologic Complaints and Symptoms: Negative for: Numbness/parasthesias Medical History: Positive for: Neuropathy Negative for: Dementia; Quadriplegia; Paraplegia; Seizure Disorder Psychiatric Complaints and Symptoms: Negative for: Claustrophobia Medical  History: Negative for: Anorexia/bulimia; Confinement Anxiety Past Medical History Notes: depression Hematologic/Lymphatic [name], [name] ([id number] Page 14 of 15 Medical History: Positive for: Anemia - iron deficiency Negative for: Hemophilia; Human Immunodeficiency Virus; Lymphedema; Sickle Cell Disease Respiratory Medical History: Positive for: Chronic Obstructive Pulmonary Disease (COPD); Sleep Apnea Negative for: Aspiration; Asthma; Pneumothorax; Tuberculosis Endocrine Medical History: Positive for: Type II Diabetes - uncontrolled Negative for: Type I Diabetes Time with diabetes: 1999 Treated with: Insulin Blood sugar tested every day: Yes T ested : 3x per day Blood sugar testing results: Breakfast: 81-200; Lunch: 180-210; Dinner: 200 Genitourinary Medical History: Negative for: End Stage Renal Disease Immunological Medical History: Negative for: Lupus Erythematosus; Raynauds; Scleroderma Oncologic Medical History: Negative for: Received Chemotherapy; Received Radiation Immunizations Pneumococcal Vaccine: Received Pneumococcal Vaccination: Yes Received Pneumococcal Vaccination On or After 60th Birthday: No Implantable Devices None Hospitalization / Surgery History Type of Hospitalization/Surgery inguinal hernia repair with mesh right shoulder rotator cuff repair toe nail excision Sepsis 09/03/21-09/10/22 Family and Social History Cancer: No; Diabetes: Yes - Mother,Father,Siblings; Heart Disease: No; Hereditary Spherocytosis: No; Hypertension: Yes - Mother,Father; Kidney Disease: No; Lung Disease: No; Seizures: No; Stroke: No; Thyroid Problems: No; Tuberculosis: No; Current every day smoker - 4 cig per day; Marital Status - Separated; Alcohol Use: Never; Drug Use: Prior History - cocaine, Ramos quit 2005; Caffeine Use: Moderate; Financial Concerns: No; Food, Clothing or Shelter Needs: No; Support System Lacking: No;  Transportation Concerns: No Electronic Signature(s) Signed: 12/17/2022 10:32:58 AM By: [name] DO Signed: 12/17/2022 4:36:36 PM By: [name] Signed: 12/25/2022 4:26:45 PM By: Rhae Hammock RN Entered By: [name] on 12/17/2022 10:04:46 -------------------------------------------------------------------------------- SuperBill Details [name], [name] 12/17/2022 Medical Record Number: [id number] Patient Account Number: [id number] Date of Birth/Sex: Treating RN: 07-27-1956 (67 y.o. 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 L97.912 Non-pressure chronic ulcer of unspecified part of right lower leg with fat layer exposed I89.0 Lymphedema, not elsewhere classified E44.0 Moderate protein-calorie malnutrition E11.622 Type 2 diabetes mellitus with other skin ulcer [id number] Chronic diastolic (congestive) heart failure Facility Procedures : CPT4 Code: [id number] Description: 99213 - WOUND CARE VISIT-LEV 3 EST PT Modifier: Quantity: 1 : CPT4 Code: [id number] Description: [id number] - DEB SUBQ TISSUE 20 SQ CM/< ICD-10 Diagnosis Description 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e L97.912 Non-pressure chronic ulcer of unspecified part of right lower leg with fat layer Modifier: d exposed Quantity: 1 : CPT4 Code: [id number] Description: 11045 - DEB SUBQ TISS EA ADDL 20CM  ICD-10 Diagnosis Description L97.822 Non-pressure chronic ulcer of other part of left lower leg  with fat layer expose Modifier: d Quantity: 27 Physician Procedures : CPT4 Code Description Modifier [id number] 11042 - WC PHYS SUBQ TISS 20 SQ CM ICD-10 Diagnosis Description 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ed L97.912 Non-pressure chronic ulcer of unspecified part of  right lower leg with fat layer exposed Quantity: 1 : [id number] - WC PHYS SUBQ TISS EA ADDL 20 CM ICD-10 Diagnosis Description 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ed Quantity: 27 Electronic Signature(s) Signed: 12/17/2022 11:07:58 AM By: [name] DO Entered By: [name] on 12/17/2022 11:06:48

## 2023-01-03 DIAGNOSIS — I5042 Chronic combined systolic (congestive) and diastolic (congestive) heart failure: Secondary | ICD-10-CM | POA: Diagnosis not present

## 2023-01-13 DIAGNOSIS — Z79899 Other long term (current) drug therapy: Secondary | ICD-10-CM | POA: Diagnosis not present

## 2023-01-13 DIAGNOSIS — G8929 Other chronic pain: Secondary | ICD-10-CM | POA: Diagnosis not present

## 2023-01-14 DIAGNOSIS — F330 Major depressive disorder, recurrent, mild: Secondary | ICD-10-CM | POA: Diagnosis not present

## 2023-01-17 DIAGNOSIS — H40059 Ocular hypertension, unspecified eye: Secondary | ICD-10-CM | POA: Diagnosis not present

## 2023-01-17 DIAGNOSIS — H259 Unspecified age-related cataract: Secondary | ICD-10-CM | POA: Diagnosis not present

## 2023-01-20 DIAGNOSIS — E1159 Type 2 diabetes mellitus with other circulatory complications: Secondary | ICD-10-CM | POA: Diagnosis not present

## 2023-01-24 DIAGNOSIS — F330 Major depressive disorder, recurrent, mild: Secondary | ICD-10-CM | POA: Diagnosis not present

## 2023-01-24 DIAGNOSIS — E1159 Type 2 diabetes mellitus with other circulatory complications: Secondary | ICD-10-CM | POA: Diagnosis not present

## 2023-01-24 DIAGNOSIS — I110 Hypertensive heart disease with heart failure: Secondary | ICD-10-CM | POA: Diagnosis not present

## 2023-01-24 DIAGNOSIS — I5042 Chronic combined systolic (congestive) and diastolic (congestive) heart failure: Secondary | ICD-10-CM | POA: Diagnosis not present

## 2023-01-28 ENCOUNTER — Encounter (HOSPITAL_BASED_OUTPATIENT_CLINIC_OR_DEPARTMENT_OTHER): Payer: Medicare HMO | Attending: Internal Medicine | Admitting: Internal Medicine

## 2023-01-28 DIAGNOSIS — E1151 Type 2 diabetes mellitus with diabetic peripheral angiopathy without gangrene: Secondary | ICD-10-CM | POA: Diagnosis not present

## 2023-01-28 DIAGNOSIS — E11622 Type 2 diabetes mellitus with other skin ulcer: Secondary | ICD-10-CM | POA: Diagnosis not present

## 2023-01-28 DIAGNOSIS — L97912 Non-pressure chronic ulcer of unspecified part of right lower leg with fat layer exposed: Secondary | ICD-10-CM | POA: Insufficient documentation

## 2023-01-28 DIAGNOSIS — I87333 Chronic venous hypertension (idiopathic) with ulcer and inflammation of bilateral lower extremity: Secondary | ICD-10-CM | POA: Diagnosis not present

## 2023-01-28 DIAGNOSIS — F17200 Nicotine dependence, unspecified, uncomplicated: Secondary | ICD-10-CM | POA: Diagnosis not present

## 2023-01-28 DIAGNOSIS — Z794 Long term (current) use of insulin: Secondary | ICD-10-CM | POA: Insufficient documentation

## 2023-01-28 DIAGNOSIS — I872 Venous insufficiency (chronic) (peripheral): Secondary | ICD-10-CM | POA: Diagnosis not present

## 2023-01-28 DIAGNOSIS — E1140 Type 2 diabetes mellitus with diabetic neuropathy, unspecified: Secondary | ICD-10-CM | POA: Insufficient documentation

## 2023-01-28 DIAGNOSIS — I2510 Atherosclerotic heart disease of native coronary artery without angina pectoris: Secondary | ICD-10-CM | POA: Diagnosis not present

## 2023-01-28 DIAGNOSIS — I5032 Chronic diastolic (congestive) heart failure: Secondary | ICD-10-CM | POA: Diagnosis not present

## 2023-01-28 DIAGNOSIS — L97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ed: Secondary | ICD-10-CM | POA: Insufficient documentation

## 2023-01-28 DIAGNOSIS — E440 Moderate protein-calorie malnutrition: Secondary | ICD-10-CM | POA: Insufficient documentation

## 2023-01-28 DIAGNOSIS — L97812 Non-pressure chronic ulcer of other part of right lower leg with fat layer exposed: Secondary | ICD-10-CM | POA: Diagnosis not present

## 2023-01-28 DIAGNOSIS — I110 Hypertensive heart disease with heart failure: Secondary | ICD-10-CM | POA: Insufficient documentation

## 2023-01-28 DIAGNOSIS — I890 Lymphedema, not elsewhere classified: Secondary | ICD-10-CM | POA: Insufficient documentation

## 2023-01-28 DIAGNOSIS — L97222 Non-pressure chronic ulcer of left calf with fat layer exposed: Secondary | ICD-10-CM | POA: Diagnosis not present

## 2023-01-28 DIAGNOSIS — J449 Chronic obstructive pulmonary disease, unspecified: Secondary | ICD-10-CM | POA: Diagnosis not present

## 2023-01-28 NOTE — Progress Notes (Signed)
[name], [name] ([id number]) [id number] Page 1 of 12 Visit Report for 01/28/2023 Debridement Details Patient Name: Date of Service: [name], [name] 01/28/2023 12:45 PM Medical Record Number: [id number] Patient Account Number: [id number] Date of Birth/Sex: Treating RN: August 13, 1956 (67 y.o. M) Primary Care Provider: Renford [name] Other Clinician: Referring Provider: Treating Provider/Extender: [name] in Treatment: 6 Debridement Performed for Assessment: Wound #31 Right,Medial Lower Leg Performed By: Physician [name]., MD Debridement Type: Debridement Severity of Tissue Pre Debridement: Fat layer exposed Level of Consciousness (Pre-procedure): Awake and Alert Pre-procedure Verification/Time Out Yes - 12:55 Taken: Start Time: 12:56 Pain Control: Lidocaine 4% T opical Solution Percent of Wound Bed Debrided: 100% T Area Debrided (cm): otal 7.85 Tissue and other material debrided: Viable, Non-Viable, Slough, Subcutaneous, Skin: Dermis , Skin: Epidermis, Biofilm, Slough Level: Skin/Subcutaneous Tissue Debridement Description: Excisional Instrument: Curette Bleeding: Moderate Hemostasis Achieved: Pressure End Time: 13:06 Procedural Pain: 0 Post Procedural Pain: 0 Response to Treatment: Procedure was tolerated well Level of Consciousness (Post- Awake and Alert procedure): Post Debridement Measurements of Total Wound Length: (cm) 5 Width: (cm) 2 Depth: (cm) 0.1 Volume: (cm) 0.785 Character of Wound/Ulcer Post Debridement: Improved Severity of Tissue Post Debridement: Fat layer exposed Post Procedure Diagnosis Same as Pre-procedure Electronic Signature(s) Signed: 01/28/2023 3:53:26 PM By: [name] Najjar MD Entered By: [name] on 01/28/2023 13:24:45 -------------------------------------------------------------------------------- Debridement Details Patient Name: Date of Service: [name], [name] 01/28/2023 12:45  PM Medical Record Number: [id number] Patient Account Number: [id number] Date of Birth/Sex: Treating RN: 13-Sep-1956 (67 y.o. [name] Primary Care Provider: Renford [name] Other Clinician: Referring Provider: Treating Provider/Extender: [name] in Treatment: 6 Debridement Performed for Assessment: Wound #30 Left,Posterior Lower Leg Performed By: Physician [name]., MD Debridement Type: Debridement Severity of Tissue Pre Debridement: Fat layer exposed Level of Consciousness (Pre-procedure): Awake and Alert Pre-procedure Verification/Time Out Yes - 12:55 Taken: Start Time: 12:56 Pain Control: Lidocaine 4% T opical Solution Percent of Wound Bed Debrided: 75% T Area Debrided (cm): otal 165.14 Tissue and other material debrided: Viable, Non-Viable, Slough, Subcutaneous, Skin: Dermis , Skin: Epidermis, Biofilm, Slough [name], [name] ([id number]) [phone].pdf Page 2 of 12 Level: Skin/Subcutaneous Tissue Debridement Description: Excisional Instrument: Curette Bleeding: Moderate Hemostasis Achieved: Pressure End Time: 13:06 Procedural Pain: 0 Post Procedural Pain: 0 Response to Treatment: Procedure was tolerated well Level of Consciousness (Post- Awake and Alert procedure): Post Debridement Measurements of Total Wound Length: (cm) 17 Width: (cm) 16.5 Depth: (cm) 0.3 Volume: (cm) 66.091 Character of Wound/Ulcer Post Debridement: Improved Severity of Tissue Post Debridement: Fat layer exposed Post Procedure Diagnosis Same as Pre-procedure Electronic Signature(s) Signed: 01/28/2023 3:53:26 PM By: [name] Najjar MD Signed: 01/28/2023 4:32:52 PM By: [name] Stall RN, BSN Entered By: [name] on 01/28/2023 13:49:27 -------------------------------------------------------------------------------- HPI Details Patient Name: Date of Service: [name], [name] 01/28/2023 12:45 PM Medical Record Number:  [id number] Patient Account Number: [id number] Date of Birth/Sex: Treating RN: [birth year] (67 y.o. M) Primary Care Provider: Renford [name] Other Clinician: Referring Provider: Treating Provider/Extender: [name] in Treatment: 6 History of Present Illness HPI Description: 07/07/15; this is [name] patient who is in hospital on 8/2 through 8/4. He had cellulitis and abscess of predominantly I think the left leg. He received IV antibiotics. Plain x-ray showed no osteomyelitis. An MRI of the left leg did not show osteomyelitis. Cultures showed no predominant organism. His hemoglobin A1c was 9.8. He has [name] history of venous stasis also peripheral vascular disease.  He was discharged to [facility] nursing home. He really has extensive ulcerations on the left lateral leg including [name] major wound that communicates both posteriorly and superiorly w. When drainage coming out of 2 smaller areas. He has [name] smaller wound on the posterior medial left leg. He has more predominantly venous insufficiency wounds on predominantly the right medial leg above the ankle the right foot into sections. He has recently been put on Augmentin at the nursing home. Previous ABIs/arterial evaluation showed triphasic waves diffusely he does not have [name] major ischemic issue [name] left lower extremity venous duplex also exam showed no evidence of [name] DVT on 6/21 07/21/15; the patient arrives with really no major change. Culture I did last time was negative. He has not had [name] follow-up MRI I ordered. The wounds are macerated covered with [name] thick gelatinous surface slough. There is necrotic subcutaneous tissue 08/04/15; the patient arrives with [name] considerable improvement in the majority of his wound area. The area on the left leg now has what looks to be [name] granulated base. Most of the wounds on the left leg required an aggressive surgical debridement to remove nonviable fibrinous eschar and subcutaneous tissue  however after debridement most of this looks better. Although I had asked for repeat MRI of the left leg when he first came in here with grossly purulent material coming out of his wounds, it does not appear that this is been done and nor do I actually feel that strongly about it right now. He has severe surrounding venous insufficiency and inflammation. I don't believe he has significant PAD 08/25/15. In general there is still [name] considerable wound area here but with still extensive service adherent slough. The patient will not allow mechanical debridement due to pain. He did not tolerate Medihoney therefore we are left with Santyl for now. She would appear that he has severe surrounding venous stasis. There is no evidence of the infection that may have had something to do with the pathogenesis of these wounds 09/29/15; patient still has substantial wound area on the left leg with [name] cluster of several wounds on the lateral leg confluently on the left leg posteriorly and then [name] substantial wound on the medial leg. On the right leg [name] substantial wound medially and [name] small area on the right lateral foot. All of these underwent [name] substantial surgical debridement with curettes which she tolerated better than he has in the past. This started as [name] complex cellulitis in the face of chronic venous insufficiency and inflammation. 10/13/15; substantial cluster of wounds on the lateral aspect of his left leg confluently around to the other side. Extensive surgical debridement to remove for redness surface slough nonviable subcutaneous tissue. This is not an improvement. Also substantial wound on the medial right leg which is largely unchanged. The etiology of this was felt to be [name] complex cellulitis in the summer of 2016 in the face of chronic venous insufficiency and inflammation. The patient states that the wound on the right medial leg has been there for years off and on. 10/20/15; we are able to start [facility] to these extensive wound areas left greater than right on Tuesday after I spoke to the wound care nurse at the facility. He arrives here today for extensive surgical debridement for the wounds on the lateral aspect of his left leg posterior left leg, this is almost circumferential. He has [name] large wound on the medial aspect of his right leg although he finds this too  painful for debridement 10/27/15; I continue to bring this patient back for frequent debridement/weekly debridement severe. We have been using Hydrofera Blue. Unfortunately this has not really had any improvement. The debridement surgery difficult and painful for the patient 11/23/15; this patient spent [name] complex hospitalization admitted with acute kidney injury, anemia, cellulitis of the lower extremity, he was felt to have sepsis pathophysiology although his blood cultures were negative. He had plain x-rays of both legs that were negative for osseous abnormalities. He was seen by infectious disease and placed on [name] workup for vasculitis that was negative including [name] biopsy 4 all were consistent with stasis dermatitis. Vital broad- spectrum antibiotics he continued to spike fevers. Urine and chest x-ray were negative Dopplers on admission ruled out [name] DVT All antibiotics were stopped on . 2/17. Since his return to [facility] skilled nursing facility I believe they have been using Xeroform 12/07/15 the patient arrives today with the area on his right medial leg actually looking quite stable. No debridement. The rest of his extensive wounds on the left lateral left posterior extending into the left medial leg all required extensive debridement. I think this is probably going to need to and up in the hands of plastic surgery. We'll attempt to change him back to [facility]. Arrange consultation with plastic surgery at [facility] for this almost circumferential wound [name], [name] ([id number]) [phone].pdf Page 3 of  12 on the left side 12/21/15 right leg covered in surface slough. This was debridement. Left leg extensive wounds all carefully examined. This is almost circumferential on the left side especially on the posterior calf. No debridement is necessary. 01/04/16 the patient has been to [facility] plastic surgery. Unfortunately it doesn't look like they had any of the prior workup on this patient. They're in the middle of vascular workup. It sounds as though they're applying Hydrofera Blue at the nursing home. 01/22/16; the patient has been back to see [facility]. There apparently making plans to possibly do skin grafts over his large venous insufficiency ulcerations. The patient tells me that he goes to hematology in [facility] and variably uses the term platelets red cells and the description of his problem. 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 I do not see any major difference in the wound on the right medial leg and circumferentially across the left medial to left lateral leg. Did not attempt to debride these today. Readmission: 05/05/18 on evaluation today patient presents for reevaluation here in our clinic although I have previously seen him in the skilled nursing facility over the past year and [name] half when I was working in the skilled nursing facility realm instead of covering in the clinic. With that being said during that time we had initiated most recently dressings with Hydrofera Blue Dressing along with the [employer] which had done very well for him. Much better than the Kerlex Coban wraps. With that being said the patient had been doing fairly well and was making progress when I last saw him. Upon evaluation today it appears that the wounds are actually [name] little bit worse than when I last saw him although definitely not dramatically so. There does not appear to be any evidence of infection which is good news. With that being said he has been  tolerating the wraps without complication his left hip still gives him [name] lot of trouble which is his main issue as far as movement is concerned. Unbeknownst to me  he has not had venous studies that I can find. He previously told me he had there were arterial studies noted back from 04/27/15 which showed that he had try phasic blood flow throughout and again his test appear to be completely normal as performed by Dr. Gomez Cleverly. With that being said I cannot find where he had venous studies. The patient still states he thinks he did these definitely had no venous intervention at this point. Upon evaluation today it does not appear to me that the patient has any evidence of cellulitis. He does have some chronic venous insufficiency which I think has led to stasis dermatitis but again this is not appearing to be infected at this point. No fevers, chills, nausea, or vomiting noted at this time. READMISSION 06/30/2018 This is [name] now 67 year old man we have had in this clinic at multiple times in the past. Most recently he came in August and was seen by [facility] stone. It is not clear why he did not come back we asked him really did not get [name] straight answer. He is at [facility] skilled facility he is [name] man who has severe chronic venous insufficiency with lymphedema and has had severe wounds on his left greater than right leg. We had him here in 2016 and 17 ultimately referred him to [facility]. He had arterial studies and venous studies done at [facility] although I am not able to access these records I see they are actually done. He also had multiple biopsies that were negative for malignancy showing changes compatible with chronic inflammation. As I understand things in [place] they are using Iodoflex and Unna boots. I am not really sure how they are getting enough Iodoflex for the large wound area especially on his left calf. Nevertheless the wounds look better than when I saw this in 2017. There were  plans for him to have plastic surgery in 2018 and I think he was actually prepped for surgery however he was ultimately denied because the patient was an active smoker. The patient is not systemically unwell. The wounds are painful however given the size especially on the left this is not surprising. ABIs in this clinic were 0.78 on the left and 0.91 on the right 07/13/2018; this is [name] patient with bilateral severe venous ulcers with secondary lymphedema. The wounds are on the right medial calf and [name] substantial part of the left posterior calf and some involvement medially and laterally on the left. We changed him to silver alginate under Unna boot's last time. The wounds actually look fairly satisfactory today. 08/14/2018; this is [name] patient with severe bilateral venous stasis ulcers with secondary lymphedema left greater than right he is at [facility] using silver alginate under Unna boot's I do not think there is much change on this visit versus last time I saw him [name] month ago Readmission: 11/04/18 patient presents today for follow-up concerning his bilateral lower extremity ulcers. His [name] right medial ankle ulcer and [name] left leg that has ulcers over [name] large proportion of the surface area between the ankle and knee. Unfortunately this does cause some discomfort for the patient although it doesn't seem to be as uncomfortable as it has been in the past. He is seen today for evaluation after [name] referral by [place] back to the clinic. Unfortunately has [name] lot of Slough noted on the surface of his wounds. He's been treated currently it sounds like with Dakin's soaked gauze and they have not been performing  the [employer] wrap that was previously recommended. I'm not exactly sure what the reason for the change was. He does have less slough on the surface of the wound but again this is something that is often [name] constant issue for him. Again he's had these wounds for many years. I've known him for two years at  least during that time when I was taking care of them in the facility he is Artie had the wounds for several years prior. READMISSION 02/25/2020 [name] is [name] now 67 year old man. He has been in our clinic several times before most extensively in October 2016 through May 2017. At that time he had absolutely substantial bilateral lower extremity wounds secondary to chronic venous insufficiency and lymphedema. We ultimately referred him to [facility] he had [name] series of biopsies only showed suggestion of wound secondary to chronic venous insufficiency. He had [name] less extensive stay in our clinic in 2019 and then [name] single visit in February 2020. He is [name] resident at [facility] skilled facility. I think he has had intermittently had in facility wound care. He returns today. They are using silver alginate on the wounds although I am not sure what type of compression. Previously he has favored Unna boots. He is not felt to have an arterial issue. Past medical history; lymphedema and chronic venous insufficiency, diabetes mellitus, hypertension, congestive heart failure We did not do arterial studies today 04/27/2020 on evaluation patient appears to be doing really about the same as when I have known him and seen him in the past. He does have wounds on the right and left legs at this point. Fortunately there is no signs of active infection at this time. 9/2; 1 month follow-up. This is [name] man with severe chronic venous insufficiency and lymphedema. When I first met him he had horrible circumferential bilateral lower extremity ulcers. We have been using silver alginate up until early last month when it was changed to [facility] and Unna boot's more or less says maintenance dressings. Since the last time he was here the facility where he resides [facility] called to report they could no longer do Unna boot so he is apparently only been receiving kerlix Coban the left leg is certainly [name] lot worse with almost  circumferential large wounds especially lateral but now medial and posterior as well. He has [name] standard same looking wound on the right medial lower leg. 10/1; absolutely no improvement here. In fact I do not think anything much is changed. The substantial area on his left lateral and left posterior calf is still open may be slightly larger. He has [name] more modest wound on the right medial calf. None of these look any different. He comes in with [name] kerlix Coban wrap this will simply not be adequate. He has too much edema in this leg He also is complaining [name] lot of pain in his left heel area. 11/8; potential wounds on the left lateral and posterior calf and [name] smaller oval wound in the right medial. We have been using Hydrofera Blue under 4-layer compression. He was using Unna boots still 2 months ago the facility called to say they can no longer place these so we have been using 4-layer compression. The surface area of the area on the left is so large there is very few alternatives to what we can use on this either silver alginate or Hydrofera Blue. He was complaining of pain on the left heel last time I asked for  an x-ray this was apparently done although we do not have the result. He is not complaining of pain today. 08/16/2020 on evaluation today patient is is seen for [name] early visit due to the fact that he apparently is having issues I was told when they called on Monday with [name] MRSA infection that they felt like we needed to see him for because his legs "looked horrible". With that being said based on what I see at this point it [name], [name] ([id number]) [phone].pdf Page 4 of 12 does not appear that the patient is actually having [name] terrible infection in fact compared to last time I saw him his legs do not appear to be doing too poorly at all at this time. Nonetheless he has been on doxycycline that is not going to be [name] good option for him based on the culture report that I  have for review today which graded as [name] partial report with still several organisms pending as far as identification is concerned we did contact them but again they do not have the final report yet. Either way based on what we see it appears that doxycycline is one of the few medications that will not work for the Citrobacter. Obviously I think that [name] different medication would be [name] good option for him since there is [name] gram-negative organism pending I am likely can I suggest Cipro as the best of backup antibiotic to switch him to at this point. All this was discussed with the patient today. 12/20; not much change in the substantial wound on the left posterior calf and the small area on the right medial. He has [name] new area on the left anterior which looks like [name] wrap injury. Had some thoughts about doing [name] deep tissue culture here although we will put that off for next time. Using silver alginate as [name] primary dressing 2/10; substantial area on the left posterior calf. This measures smaller but still is [name] very large area. He has the area on the right anterior medial which also is [name] fairly large wound but much smaller than not on the left. His edema control is quite good. We have been using silver alginate because of the size of the wounds. 4/7; substantial wound on the left posterior calf and [name] smaller area on the right medial lower leg. These are very chronic wounds which we have classified is venous. Previously worked up at [facility] for 5 years ago at which time I had sent and will send him over for consideration of extensive skin graft I know they biopsied this many times but he never had plastic surgery. The wound area is much too large for standard size dressings we have been using silver alginate but we did give him [name] good trial last fall of Hydrofera Blue that did not make any difference either. If anything the area on the right medial lower leg over time has gotten [name] lot smaller as has the area on  the left although it still quite substantial now. Readmission 10/08/2021 Mr. [name] is [name] 67 year old male with [name] past medical history of insulin-dependent type 2 diabetes with last hemoglobin A1c of 10.2, chronic venous insufficiency, lymphedema and chronic diastolic heart failure that presents to the clinic for bilateral lower extremity wounds. He has been followed in our clinic previously for the past 6 years for these issues. He was last seen in April 2022. He is inconsistent with his appointments in our clinic. These wounds have not healed over  the past few years. He is currently using silver alginate to the wound beds and keeping these covered with Kerlix. He currently denies systemic signs of infection. He was recently hospitalized for septic shock secondary to bacteremia from likely chronic leg wounds And is currently on oral antibiotics to be completed this week. 1/30; patient presents for follow-up. He has no issues or complaints today. 2/6; patient presents for follow-up. He reports more discomfort with the increase in compression wraps. He currently denies systemic signs of infection and feels well overall. 2/13; patient presents for follow-up. He reports tolerating the current leg/Coban wrap well. He denies systemic signs of infection and feels fine overall. 2/21; patient presents for follow-up. He has no issues or complaints today. He tolerated the kirlex/Coban wrap. He denies systemic signs of infection. 3/21; patient presents for follow-up. He has no issues or complaints today. He saw infectious disease, Dr. Orvan Falconer. Nothing further to do from an infectious disease point. Levaquin was stopped. He currently denies systemic signs of infection. 4/4; patient presents for follow-up. His facility was able to obtain the [facility] antibiotic and has started using this with dressing changes/wrap changes. Patient has no issues or complaints today. He denies signs of infection. 4/18; patient  presents for follow-up. He has no issues or complaints today. He denies signs of infection. 5/9; patient presents for follow-up. He has been using Keystone antibiotic under Kerlix/Coban. He denies signs of infection today. 6/6; monthly follow-up. Patient is at [facility] skilled facility. He has been using Keystone probably silver alginate under kerlix Coban. We use Sorbact here. Wound condition has improved 7/11; patient presents for follow-up. We have been using Keystone with Sorbact under Kerlix/Coban. Patient has no issues or complaints today. 8/8; patient presents for follow-up. He is still using Keystone and Sorbact under Kerlix/Coban to his lower extremities bilaterally. He has no issues or complaints today. He denies signs of infection. 9/12; patient presents for follow-up. We have been using Keystone with Sorbact and calcium alginate under Kerlix/Coban to lower extremities bilaterally. He has no issues or complaints today. 10/10; patient presents for follow-up. We continue to use [facility] with Sorbact and calcium alginate under Kerlix/Coban. He has tolerated this well and he has no issues or complaints today. 11/14; patient presents for follow-up. We have been using Keystone with Sorbact and calcium alginate under Kerlix/Coban to the lower extremities bilaterally. He cannot tolerate higher compression. He states that the facility he resides and changes his dressings 3 times [name] week. He has no issues or complaints today. 12/12; patient presents for follow-up. We have been using Keystone antibiotic ointment with Sorbact and calcium alginate under Kerlix/Coban to the lower extremities bilaterally. He is open to trying [name] higher level compression. His ABIs do not suggest significant arterial insufficiency. 1/8; patient presents for follow up. We have been using Keystone antibiotic ointment with Sorbact and calcium alginate. We increased the compression to 3 layer compression last visit however  patient came in with Kerlix/Coban and no sorbact. It is unclear if the patient's facility has been using [name] higher level compression. We will follow-up with his facility. 12/17/2022 Patient has not been back since January. Patient has bilateral lower extremity wounds. These are the same wounds we have been treating. Facility has been using silver alginate under compression. Patient has no issues or complaints today. 5/7; this is [name] patient who lives in [facility] skilled facility. We have been following this man for years with severe bilateral lower extremity ulcers/venous wounds. When he  first came to the clinic he had multiple biopsies but they were never particularly diagnostic of any other etiology. The wound area especially on the left calf area is particularly large not well covered by anything that is skilled facility would have except silver alginate. We are using kerlix Coban compression bilaterally. There has been some improvement in the right medial lower leg. There is [name] fairly large amount of nonviable debris especially on the left Electronic Signature(s) Signed: 01/28/2023 3:53:26 PM By: [name] Najjar MD Entered By: [name] on 01/28/2023 13:44:33 -------------------------------------------------------------------------------- Physical Exam Details Patient Name: Date of Service: [name], [name] 01/28/2023 12:45 PM Medical Record Number: [id number] Patient Account Number: [id number] [name], [name] ([id number]) [phone].pdf Page 5 of 12 Date of Birth/Sex: Treating RN: 02-04-1956 (67 y.o. M) Primary Care Provider: Other Clinician: Renford [name] Referring Provider: Treating Provider/Extender: [name] in Treatment: 6 Notes 5/7; bilateral lower extremity wounds. Left is much larger than right. Severe venous insufficiency skin changes. The majority of the wound on the left is posterior. [name] lot of this covered in [name] adherent  nonviable surface. I used [name] large curette to remove as much of this is we could do. Also on the right to [name] lesser extent. Electronic Signature(s) Signed: 01/28/2023 3:53:26 PM By: [name] Najjar MD Entered By: [name] on 01/28/2023 13:46:42 -------------------------------------------------------------------------------- Physician Orders Details Patient Name: Date of Service: [name], [name] 01/28/2023 12:45 PM Medical Record Number: [id number] Patient Account Number: [id number] Date of Birth/Sex: Treating RN: 1956-06-17 (67 y.o. Harlon Flor, Millard.Loa Primary Care Provider: Renford [name] Other Clinician: Referring Provider: Treating Provider/Extender: [name] in Treatment: 6 Verbal / Phone Orders: No Diagnosis Coding ICD-10 Coding Code Description [phone] Chronic venous hypertension (idiopathic) with ulcer and inflammation of bilateral lower extremity 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ed L97.912 Non-pressure chronic ulcer of unspecified part of right lower leg with fat layer exposed I89.0 Lymphedema, not elsewhere classified E44.0 Moderate protein-calorie malnutrition E11.622 Type 2 diabetes mellitus with other skin ulcer I50.32 Chronic diastolic (congestive) heart failure Follow-up Appointments ppointment in: - x6 weeks Dr. Mikey Bussing 1245pm 03/11/2023 room 8 Return [name] Other: - [facility] SN[facility] Anesthetic (In clinic) Topical Lidocaine 4% applied to wound bed Bathing/ Shower/ Hygiene May shower with protection but do not get wound dressing(s) wet. Protect dressing(s) with water repellant cover (for example, large plastic bag) or [name] cast cover and may then take shower. Edema Control - Lymphedema / SCD / Other Elevate legs to the level of the heart or above for 30 minutes daily and/or when sitting for 3-4 times [name] day throughout the day. Wound Treatment Wound #30 - Lower Leg Wound Laterality: Left,  Posterior Cleanser: Soap and Water 3 x Per Week/30 Days Discharge Instructions: May shower and wash wound with dial antibacterial soap and water prior to dressing change. Cleanser: Vashe 5.8 (oz) 3 x Per Week/30 Days Discharge Instructions: Cleanse the wound with Vashe prior to applying [name] clean dressing using gauze sponges, not tissue or cotton balls. Peri-Wound Care: Zinc Oxide Ointment 30g tube 3 x Per Week/30 Days Discharge Instructions: Apply Zinc Oxide TO MACERATED AREAS AROUND THE WOUND. Peri-Wound Care: Sween Lotion (Moisturizing lotion) 3 x Per Week/30 Days Discharge Instructions: Apply moisturizing lotion as directed Prim Dressing: Maxorb Extra Ag+ Alginate Dressing, 4x4.75 (in/in) 3 x Per Week/30 Days ary Discharge Instructions: Apply to wound bed as instructed Secondary Dressing: ABD Pad, 8x10 3 x Per  Week/30 Days Discharge Instructions: Apply over primary dressing as directed. [name], [name] ([id number]) [id number] Page 6 of 12 Compression Wrap: Kerlix Roll 4.5x3.1 (in/yd) 3 x Per Week/30 Days Discharge Instructions: Apply Kerlix and Coban compression as directed. Compression Wrap: Coban Self-Adherent Wrap 4x5 (in/yd) 3 x Per Week/30 Days Discharge Instructions: Apply over Kerlix as directed. Wound #31 - Lower Leg Wound Laterality: Right, Medial Cleanser: Soap and Water 3 x Per Week/30 Days Discharge Instructions: May shower and wash wound with dial antibacterial soap and water prior to dressing change. Cleanser: Vashe 5.8 (oz) 3 x Per Week/30 Days Discharge Instructions: Cleanse the wound with Vashe prior to applying [name] clean dressing using gauze sponges, not tissue or cotton balls. Peri-Wound Care: Zinc Oxide Ointment 30g tube 3 x Per Week/30 Days Discharge Instructions: Apply Zinc Oxide TO MACERATED AREAS AROUND THE WOUND. Peri-Wound Care: Sween Lotion (Moisturizing lotion) 3 x Per Week/30 Days Discharge Instructions: Apply moisturizing lotion as  directed Prim Dressing: Maxorb Extra Ag+ Alginate Dressing, 4x4.75 (in/in) 3 x Per Week/30 Days ary Discharge Instructions: Apply to wound bed as instructed Secondary Dressing: ABD Pad, 8x10 3 x Per Week/30 Days Discharge Instructions: Apply over primary dressing as directed. Compression Wrap: Kerlix Roll 4.5x3.1 (in/yd) 3 x Per Week/30 Days Discharge Instructions: Apply Kerlix and Coban compression as directed. Compression Wrap: Coban Self-Adherent Wrap 4x5 (in/yd) 3 x Per Week/30 Days Discharge Instructions: Apply over Kerlix as directed. Electronic Signature(s) Signed: 01/28/2023 3:53:26 PM By: [name] Najjar MD Signed: 01/28/2023 4:32:52 PM By: [name] Stall RN, BSN Entered By: [name] on 01/28/2023 13:09:17 -------------------------------------------------------------------------------- Problem List Details Patient Name: Date of Service: [name], [name] 01/28/2023 12:45 PM Medical Record Number: [id number] Patient Account Number: [id number] Date of Birth/Sex: Treating RN: 11-18-1955 (67 y.o. [name] Primary Care Provider: Renford [name] Other Clinician: Referring Provider: Treating Provider/Extender: [name] in Treatment: 6 Active Problems ICD-10 Encounter Code Description Active Date MDM Diagnosis I87.333 Chronic venous hypertension (idiopathic) with ulcer and inflammation of 12/17/2022 No Yes bilateral lower extremity 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ed3/26/2024 No Yes L97.912 Non-pressure chronic ulcer of unspecified part of right lower leg with fat layer 12/17/2022 No Yes exposed I89.0 Lymphedema, not elsewhere classified 12/17/2022 No Yes E44.0 Moderate protein-calorie malnutrition 12/17/2022 No Yes [name], [name] ([id number]) [id number] Page 7 of 12 E11.622 Type 2 diabetes mellitus with other skin ulcer 12/17/2022 No Yes I50.32 Chronic diastolic  (congestive) heart failure 12/17/2022 No Yes Inactive Problems Resolved Problems Electronic Signature(s) Signed: 01/28/2023 3:53:26 PM By: [name] Najjar MD Entered By: [name] on 01/28/2023 13:24:17 -------------------------------------------------------------------------------- Progress Note Details Patient Name: Date of Service: [name], [name] 01/28/2023 12:45 PM Medical Record Number: [id number] Patient Account Number: [id number] Date of Birth/Sex: Treating RN: [birth year] (67 y.o. M) Primary Care Provider: Renford [name] Other Clinician: Referring Provider: Treating Provider/Extender: [name] in Treatment: 6 Subjective History of Present Illness (HPI) 07/07/15; this is [name] patient who is in hospital on 8/2 through 8/4. He had cellulitis and abscess of predominantly I think the left leg. He received IV antibiotics. Plain x-ray showed no osteomyelitis. An MRI of the left leg did not show osteomyelitis. Cultures showed no predominant organism. His hemoglobin A1c was 9.8. He has [name] history of venous stasis also peripheral vascular disease. He was discharged to [facility] nursing home. He really has extensive ulcerations on the left lateral leg including [name] major wound that communicates both posteriorly  and superiorly w. When drainage coming out of 2 smaller areas. He has [name] smaller wound on the posterior medial left leg. He has more predominantly venous insufficiency wounds on predominantly the right medial leg above the ankle the right foot into sections. He has recently been put on Augmentin at the nursing home. Previous ABIs/arterial evaluation showed triphasic waves diffusely he does not have [name] major ischemic issue [name] left lower extremity venous duplex also exam showed no evidence of [name] DVT on 6/21 07/21/15; the patient arrives with really no major change. Culture I did last time was negative. He has not had [name] follow-up MRI I ordered. The wounds are macerated  covered with [name] thick gelatinous surface slough. There is necrotic subcutaneous tissue 08/04/15; the patient arrives with [name] considerable improvement in the majority of his wound area. The area on the left leg now has what looks to be [name] granulated base. Most of the wounds on the left leg required an aggressive surgical debridement to remove nonviable fibrinous eschar and subcutaneous tissue however after debridement most of this looks better. Although I had asked for repeat MRI of the left leg when he first came in here with grossly purulent material coming out of his wounds, it does not appear that this is been done and nor do I actually feel that strongly about it right now. He has severe surrounding venous insufficiency and inflammation. I don't believe he has significant PAD 08/25/15. In general there is still [name] considerable wound area here but with still extensive service adherent slough. The patient will not allow mechanical debridement due to pain. He did not tolerate Medihoney therefore we are left with Santyl for now. She would appear that he has severe surrounding venous stasis. There is no evidence of the infection that may have had something to do with the pathogenesis of these wounds 09/29/15; patient still has substantial wound area on the left leg with [name] cluster of several wounds on the lateral leg confluently on the left leg posteriorly and then [name] substantial wound on the medial leg. On the right leg [name] substantial wound medially and [name] small area on the right lateral foot. All of these underwent [name] substantial surgical debridement with curettes which she tolerated better than he has in the past. This started as [name] complex cellulitis in the face of chronic venous insufficiency and inflammation. 10/13/15; substantial cluster of wounds on the lateral aspect of his left leg confluently around to the other side. Extensive surgical debridement to remove for redness surface slough nonviable  subcutaneous tissue. This is not an improvement. Also substantial wound on the medial right leg which is largely unchanged. The etiology of this was felt to be [name] complex cellulitis in the summer of 2016 in the face of chronic venous insufficiency and inflammation. The patient states that the wound on the right medial leg has been there for years off and on. 10/20/15; we are able to start [facility] to these extensive wound areas left greater than right on Tuesday after I spoke to the wound care nurse at the facility. He arrives here today for extensive surgical debridement for the wounds on the lateral aspect of his left leg posterior left leg, this is almost circumferential. He has [name] large wound on the medial aspect of his right leg although he finds this too painful for debridement 10/27/15; I continue to bring this patient back for frequent debridement/weekly debridement severe. We have been using Hydrofera Blue. Unfortunately this has not  really had any improvement. The debridement surgery difficult and painful for the patient 11/23/15; this patient spent [name] complex hospitalization admitted with acute kidney injury, anemia, cellulitis of the lower extremity, he was felt to have sepsis pathophysiology although his blood cultures were negative. He had plain x-rays of both legs that were negative for osseous abnormalities. He was seen by infectious disease and placed on [name] workup for vasculitis that was negative including [name] biopsy o4 all were consistent with stasis dermatitis. Vital broad-spectrum antibiotics he continued to spike fevers. Urine and chest x-ray were negative Dopplers on admission ruled out [name] DVT All antibiotics were stopped on 2/17. . Since his return to [facility] skilled nursing facility I believe they have been using Xeroform 12/07/15 the patient arrives today with the area on his right medial leg actually looking quite stable. No debridement. The rest of his extensive wounds on  the left lateral left posterior extending into the left medial leg all required extensive debridement. I think this is probably going to need to and up in the hands of plastic surgery. We'll attempt to change him back to [facility]. Arrange consultation with plastic surgery at [facility] for this almost circumferential wound on the left side 12/21/15 right leg covered in surface slough. This was debridement. Left leg extensive wounds all carefully examined. This is almost circumferential on the left side especially on the posterior calf. No debridement is necessary. 01/04/16 the patient has been to [facility] plastic surgery. Unfortunately it doesn't look like they had any of the prior workup on this patient. They're in the middle of vascular workup. It sounds as though they're applying Hydrofera Blue at the nursing home. 01/22/16; the patient has been back to see [facility]. There apparently making plans to possibly do skin grafts over his large venous insufficiency ulcerations. The patient tells me that he goes to hematology in [facility] and variably uses the term platelets red cells and the description of his problem. 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 I do not see any major difference in the wound on the right medial leg and circumferentially across the left medial to left lateral leg. Did not attempt to debride these today. Readmission: [name], [name] ([id number]) [id number] Page 8 of 12 05/05/18 on evaluation today patient presents for reevaluation here in our clinic although I have previously seen him in the skilled nursing facility over the past year and [name] half when I was working in the skilled nursing facility realm instead of covering in the clinic. With that being said during that time we had initiated most recently dressings with Hydrofera Blue Dressing along with the [employer] which had done very well for him.  Much better than the Kerlex Coban wraps. With that being said the patient had been doing fairly well and was making progress when I last saw him. Upon evaluation today it appears that the wounds are actually [name] little bit worse than when I last saw him although definitely not dramatically so. There does not appear to be any evidence of infection which is good news. With that being said he has been tolerating the wraps without complication his left hip still gives him [name] lot of trouble which is his main issue as far as movement is concerned. Unbeknownst to me he has not had venous studies that I can find. He previously told me he had there were arterial studies noted back from 04/27/15 which showed that  he had try phasic blood flow throughout and again his test appear to be completely normal as performed by Dr. Gomez Cleverly. With that being said I cannot find where he had venous studies. The patient still states he thinks he did these definitely had no venous intervention at this point. Upon evaluation today it does not appear to me that the patient has any evidence of cellulitis. He does have some chronic venous insufficiency which I think has led to stasis dermatitis but again this is not appearing to be infected at this point. No fevers, chills, nausea, or vomiting noted at this time. READMISSION 06/30/2018 This is [name] now 67 year old man we have had in this clinic at multiple times in the past. Most recently he came in August and was seen by [facility] stone. It is not clear why he did not come back we asked him really did not get [name] straight answer. He is at [facility] skilled facility he is [name] man who has severe chronic venous insufficiency with lymphedema and has had severe wounds on his left greater than right leg. We had him here in 2016 and 17 ultimately referred him to [facility]. He had arterial studies and venous studies done at [facility] although I am not able to access these records I see they are  actually done. He also had multiple biopsies that were negative for malignancy showing changes compatible with chronic inflammation. As I understand things in [place] they are using Iodoflex and Unna boots. I am not really sure how they are getting enough Iodoflex for the large wound area especially on his left calf. Nevertheless the wounds look better than when I saw this in 2017. There were plans for him to have plastic surgery in 2018 and I think he was actually prepped for surgery however he was ultimately denied because the patient was an active smoker. The patient is not systemically unwell. The wounds are painful however given the size especially on the left this is not surprising. ABIs in this clinic were 0.78 on the left and 0.91 on the right 07/13/2018; this is [name] patient with bilateral severe venous ulcers with secondary lymphedema. The wounds are on the right medial calf and [name] substantial part of the left posterior calf and some involvement medially and laterally on the left. We changed him to silver alginate under Unna boot's last time. The wounds actually look fairly satisfactory today. 08/14/2018; this is [name] patient with severe bilateral venous stasis ulcers with secondary lymphedema left greater than right he is at [facility] using silver alginate under Unna boot's I do not think there is much change on this visit versus last time I saw him [name] month ago Readmission: 11/04/18 patient presents today for follow-up concerning his bilateral lower extremity ulcers. His [name] right medial ankle ulcer and [name] left leg that has ulcers over [name] large proportion of the surface area between the ankle and knee. Unfortunately this does cause some discomfort for the patient although it doesn't seem to be as uncomfortable as it has been in the past. He is seen today for evaluation after [name] referral by [place] back to the clinic. Unfortunately has [name] lot of Slough noted on the surface of his wounds. He's been  treated currently it sounds like with Dakin's soaked gauze and they have not been performing the Unna Boot wrap that was previously recommended. I'm not exactly sure what the reason for the change was. He does have less slough on the  surface of the wound but again this is something that is often [name] constant issue for him. Again he's had these wounds for many years. I've known him for two years at least during that time when I was taking care of them in the facility he is Artie had the wounds for several years prior. READMISSION 02/25/2020 [name] is [name] now 67 year old man. He has been in our clinic several times before most extensively in October 2016 through May 2017. At that time he had absolutely substantial bilateral lower extremity wounds secondary to chronic venous insufficiency and lymphedema. We ultimately referred him to [facility] he had [name] series of biopsies only showed suggestion of wound secondary to chronic venous insufficiency. He had [name] less extensive stay in our clinic in 2019 and then [name] single visit in February 2020. He is [name] resident at [facility] skilled facility. I think he has had intermittently had in facility wound care. He returns today. They are using silver alginate on the wounds although I am not sure what type of compression. Previously he has favored Unna boots. He is not felt to have an arterial issue. Past medical history; lymphedema and chronic venous insufficiency, diabetes mellitus, hypertension, congestive heart failure We did not do arterial studies today 04/27/2020 on evaluation patient appears to be doing really about the same as when I have known him and seen him in the past. He does have wounds on the right and left legs at this point. Fortunately there is no signs of active infection at this time. 9/2; 1 month follow-up. This is [name] man with severe chronic venous insufficiency and lymphedema. When I first met him he had horrible circumferential bilateral lower  extremity ulcers. We have been using silver alginate up until early last month when it was changed to [facility] and Unna boot's more or less says maintenance dressings. Since the last time he was here the facility where he resides [facility] called to report they could no longer do Unna boot so he is apparently only been receiving kerlix Coban the left leg is certainly [name] lot worse with almost circumferential large wounds especially lateral but now medial and posterior as well. He has [name] standard same looking wound on the right medial lower leg. 10/1; absolutely no improvement here. In fact I do not think anything much is changed. The substantial area on his left lateral and left posterior calf is still open may be slightly larger. He has [name] more modest wound on the right medial calf. None of these look any different. He comes in with [name] kerlix Coban wrap this will simply not be adequate. He has too much edema in this leg He also is complaining [name] lot of pain in his left heel area. 11/8; potential wounds on the left lateral and posterior calf and [name] smaller oval wound in the right medial. We have been using Hydrofera Blue under 4-layer compression. He was using Unna boots still 2 months ago the facility called to say they can no longer place these so we have been using 4-layer compression. The surface area of the area on the left is so large there is very few alternatives to what we can use on this either silver alginate or Hydrofera Blue. He was complaining of pain on the left heel last time I asked for an x-ray this was apparently done although we do not have the result. He is not complaining of pain today. 08/16/2020 on evaluation today patient is is  seen for [name] early visit due to the fact that he apparently is having issues I was told when they called on Monday with [name] MRSA infection that they felt like we needed to see him for because his legs "looked horrible". With that being said based on what  I see at this point it does not appear that the patient is actually having [name] terrible infection in fact compared to last time I saw him his legs do not appear to be doing too poorly at all at this time. Nonetheless he has been on doxycycline that is not going to be [name] good option for him based on the culture report that I have for review today which graded as [name] partial report with still several organisms pending as far as identification is concerned we did contact them but again they do not have the final report yet. Either way based on what we see it appears that doxycycline is one of the few medications that will not work for the Citrobacter. Obviously I think that [name] different medication would be [name] good option for him since there is [name] gram-negative organism pending I am likely can I suggest Cipro as the best of backup antibiotic to switch him to at this point. All this was discussed with the patient today. 12/20; not much change in the substantial wound on the left posterior calf and the small area on the right medial. He has [name] new area on the left anterior which looks like [name] wrap injury. Had some thoughts about doing [name] deep tissue culture here although we will put that off for next time. Using silver alginate as [name] primary dressing 2/10; substantial area on the left posterior calf. This measures smaller but still is [name] very large area. He has the area on the right anterior medial which also is [name] fairly large wound but much smaller than not on the left. His edema control is quite good. We have been using silver alginate because of the size of the [name], [name] ([id number]) [phone].pdf Page 9 of 12 wounds. 4/7; substantial wound on the left posterior calf and [name] smaller area on the right medial lower leg. These are very chronic wounds which we have classified is venous. Previously worked up at [facility] for 5 years ago at which time I had sent and will send him over for  consideration of extensive skin graft I know they biopsied this many times but he never had plastic surgery. The wound area is much too large for standard size dressings we have been using silver alginate but we did give him [name] good trial last fall of Hydrofera Blue that did not make any difference either. If anything the area on the right medial lower leg over time has gotten [name] lot smaller as has the area on the left although it still quite substantial now. Readmission 10/08/2021 Mr. [name] is [name] 67 year old male with [name] past medical history of insulin-dependent type 2 diabetes with last hemoglobin A1c of 10.2, chronic venous insufficiency, lymphedema and chronic diastolic heart failure that presents to the clinic for bilateral lower extremity wounds. He has been followed in our clinic previously for the past 6 years for these issues. He was last seen in April 2022. He is inconsistent with his appointments in our clinic. These wounds have not healed over the past few years. He is currently using silver alginate to the wound beds and keeping these covered with Kerlix. He currently denies systemic signs of infection.  He was recently hospitalized for septic shock secondary to bacteremia from likely chronic leg wounds And is currently on oral antibiotics to be completed this week. 1/30; patient presents for follow-up. He has no issues or complaints today. 2/6; patient presents for follow-up. He reports more discomfort with the increase in compression wraps. He currently denies systemic signs of infection and feels well overall. 2/13; patient presents for follow-up. He reports tolerating the current leg/Coban wrap well. He denies systemic signs of infection and feels fine overall. 2/21; patient presents for follow-up. He has no issues or complaints today. He tolerated the kirlex/Coban wrap. He denies systemic signs of infection. 3/21; patient presents for follow-up. He has no issues or complaints today.  He saw infectious disease, Dr. Orvan Falconer. Nothing further to do from an infectious disease point. Levaquin was stopped. He currently denies systemic signs of infection. 4/4; patient presents for follow-up. His facility was able to obtain the [facility] antibiotic and has started using this with dressing changes/wrap changes. Patient has no issues or complaints today. He denies signs of infection. 4/18; patient presents for follow-up. He has no issues or complaints today. He denies signs of infection. 5/9; patient presents for follow-up. He has been using [facility] antibiotic under Kerlix/Coban. He denies signs of infection today. 6/6; monthly follow-up. Patient is at [facility] skilled facility. He has been using [facility] probably silver alginate under kerlix Coban. We use Sorbact here. Wound condition has improved 7/11; patient presents for follow-up. We have been using Keystone with Sorbact under Kerlix/Coban. Patient has no issues or complaints today. 8/8; patient presents for follow-up. He is still using [facility]stone and Sorbact under Kerlix/Coban to his lower extremities bilaterally. He has no issues or complaints today. He denies signs of infection. 9/12; patient presents for follow-up. We have been using [facility]stone with Sorbact and calcium alginate under Kerlix/Coban to lower extremities bilaterally. He has no issues or complaints today. 10/10; patient presents for follow-up. We continue to use [facility] with Sorbact and calcium alginate under Kerlix/Coban. He has tolerated this well and he has no issues or complaints today. 11/14; patient presents for follow-up. We have been using Keystone with Sorbact and calcium alginate under Kerlix/Coban to the lower extremities bilaterally. He cannot tolerate higher compression. He states that the facility he resides and changes his dressings 3 times [name] week. He has no issues or complaints today. 12/12; patient presents for follow-up. We have been using [facility]  antibiotic ointment with Sorbact and calcium alginate under Kerlix/Coban to the lower extremities bilaterally. He is open to trying [name] higher level compression. His ABIs do not suggest significant arterial insufficiency. 1/8; patient presents for follow up. We have been using [facility] antibiotic ointment with Sorbact and calcium alginate. We increased the compression to 3 layer compression last visit however patient came in with Kerlix/Coban and no sorbact. It is unclear if the patient's facility has been using [name] higher level compression. We will follow-up with his facility. 12/17/2022 Patient has not been back since January. Patient has bilateral lower extremity wounds. These are the same wounds we have been treating. Facility has been using silver alginate under compression. Patient has no issues or complaints today. 5/7; this is [name] patient who lives in [place] skilled facility. We have been following this man for years with severe bilateral lower extremity ulcers/venous wounds. When he first came to the clinic he had multiple biopsies but they were never particularly diagnostic of any other etiology. The wound area especially on the left calf  area is particularly large not well covered by anything that is skilled facility would have except silver alginate. We are using kerlix Coban compression bilaterally. There has been some improvement in the right medial lower leg. There is [name] fairly large amount of nonviable debris especially on the left Objective Constitutional Vitals Time Taken: 12:43 PM, Weight: 245.6 lbs, Temperature: 97.6 F, Pulse: 76 bpm, Respiratory Rate: 18 breaths/min, Blood Pressure: 146/72 mmHg. Integumentary (Hair, Skin) Wound #30 status is Open. Original cause of wound was Gradually Appeared. The date acquired was: 09/23/2020. The wound has been in treatment 6 weeks. The wound is located on the Left,Posterior Lower Leg. The wound measures 17cm length x 16.5cm width x 0.3cm depth;  220.304cm^2 area and 66.091cm^3 volume. There is Fat Layer (Subcutaneous Tissue) exposed. There is no tunneling or undermining noted. There is [name] large amount of serosanguineous drainage noted. The wound margin is distinct with the outline attached to the wound base. There is small (1-33%) red granulation within the wound bed. There is [name] large (67-100%) amount of necrotic tissue within the wound bed including Adherent Slough. The periwound skin appearance did not exhibit: Callus, Crepitus, Excoriation, Induration, Rash, Scarring, Dry/Scaly, Maceration, Atrophie Blanche, Cyanosis, Ecchymosis, Hemosiderin Staining, Mottled, Pallor, Rubor, Erythema. Periwound temperature was noted as No Abnormality. Wound #31 status is Open. Original cause of wound was Gradually Appeared. The date acquired was: 10/31/2022. The wound has been in treatment 6 weeks. The wound is located on the Right,Medial Lower Leg. The wound measures 5cm length x 2cm width x 0.1cm depth; 7.854cm^2 area and 0.785cm^3 volume. There is Satterly, Jhostin ([id number]) [phone].pdf Page 10 of 12 Fat Layer (Subcutaneous Tissue) exposed. There is [name] medium amount of serosanguineous drainage noted. The wound margin is distinct with the outline attached to the wound base. There is medium (34-66%) red granulation within the wound bed. There is [name] medium (34-66%) amount of necrotic tissue within the wound bed including Adherent Slough. The periwound skin appearance exhibited: Dry/Scaly, Hemosiderin Staining. The periwound skin appearance did not exhibit: Callus, Crepitus, Excoriation, Induration, Rash, Scarring, Maceration, Atrophie Blanche, Cyanosis, Ecchymosis, Mottled, Pallor, Rubor, Erythema. Assessment Active Problems ICD-10 Chronic venous hypertension (idiopathic) with ulcer and inflammation of bilateral lower extremity Non-pressure chronic ulcer of other part of left lower leg with fat layer exposed Non-pressure chronic  ulcer of unspecified part of right lower leg with fat layer exposed Lymphedema, not elsewhere classified Moderate protein-calorie malnutrition Type 2 diabetes mellitus with other skin ulcer Chronic diastolic (congestive) heart failure Procedures Wound #30 Pre-procedure diagnosis of Wound #30 is [name] Venous Leg Ulcer located on the Left,Posterior Lower Leg .Severity of Tissue Pre Debridement is: Fat layer exposed. There was [name] Excisional Skin/Subcutaneous Tissue Debridement with [name] total area of 165.14 sq cm performed by [name]., MD. With the following instrument(s): Curette to remove Viable and Non-Viable tissue/material. Material removed includes Subcutaneous Tissue, Slough, Skin: Dermis, Skin: Epidermis, and Biofilm after achieving pain control using Lidocaine 4% Topical Solution. [name] time out was conducted at 12:55, prior to the start of the procedure. [name] Moderate amount of bleeding was controlled with Pressure. The procedure was tolerated well with [name] pain level of 0 throughout and [name] pain level of 0 following the procedure. Post Debridement Measurements: 17cm length x 16.5cm width x 0.3cm depth; 66.091cm^3 volume. Character of Wound/Ulcer Post Debridement is improved. Severity of Tissue Post Debridement is: Fat layer exposed. Post procedure Diagnosis Wound #30: Same as Pre-Procedure Wound #31 Pre-procedure diagnosis of Wound #31 is [name]  Venous Leg Ulcer located on the Right,Medial Lower Leg .Severity of Tissue Pre Debridement is: Fat layer exposed. There was [name] Excisional Skin/Subcutaneous Tissue Debridement with [name] total area of 7.85 sq cm performed by [name]., MD. With the following instrument(s): Curette to remove Viable and Non-Viable tissue/material. Material removed includes Subcutaneous Tissue, Slough, Skin: Dermis, Skin: Epidermis, and Biofilm after achieving pain control using Lidocaine 4% Topical Solution. [name] time out was conducted at 12:55, prior to the start of the  procedure. [name] Moderate amount of bleeding was controlled with Pressure. The procedure was tolerated well with [name] pain level of 0 throughout and [name] pain level of 0 following the procedure. Post Debridement Measurements: 5cm length x 2cm width x 0.1cm depth; 0.785cm^3 volume. Character of Wound/Ulcer Post Debridement is improved. Severity of Tissue Post Debridement is: Fat layer exposed. Post procedure Diagnosis Wound #31: Same as Pre-Procedure Plan Follow-up Appointments: Return Appointment in: - x6 weeks Dr. Mikey Bussing 1245pm 03/11/2023 room 8 Other: - [facility] Anesthetic: (In clinic) Topical Lidocaine 4% applied to wound bed Bathing/ Shower/ Hygiene: May shower with protection but do not get wound dressing(s) wet. Protect dressing(s) with water repellant cover (for example, large plastic bag) or [name] cast cover and may then take shower. Edema Control - Lymphedema / SCD / Other: Elevate legs to the level of the heart or above for 30 minutes daily and/or when sitting for 3-4 times [name] day throughout the day. WOUND #30: - Lower Leg Wound Laterality: Left, Posterior Cleanser: Soap and Water 3 x Per Week/30 Days Discharge Instructions: May shower and wash wound with dial antibacterial soap and water prior to dressing change. Cleanser: Vashe 5.8 (oz) 3 x Per Week/30 Days Discharge Instructions: Cleanse the wound with Vashe prior to applying [name] clean dressing using gauze sponges, not tissue or cotton balls. Peri-Wound Care: Zinc Oxide Ointment 30g tube 3 x Per Week/30 Days Discharge Instructions: Apply Zinc Oxide TO MACERATED AREAS AROUND THE WOUND. Peri-Wound Care: Sween Lotion (Moisturizing lotion) 3 x Per Week/30 Days Discharge Instructions: Apply moisturizing lotion as directed Prim Dressing: Maxorb Extra Ag+ Alginate Dressing, 4x4.75 (in/in) 3 x Per Week/30 Days ary Discharge Instructions: Apply to wound bed as instructed Secondary Dressing: ABD Pad, 8x10 3 x Per Week/30 Days Discharge  Instructions: Apply over primary dressing as directed. Com pression Wrap: Kerlix Roll 4.5x3.1 (in/yd) 3 x Per Week/30 Days Discharge Instructions: Apply Kerlix and Coban compression as directed. Com pression Wrap: Coban Self-Adherent Wrap 4x5 (in/yd) 3 x Per Week/30 Days Discharge Instructions: Apply over Kerlix as directed. WOUND #31: - Lower Leg Wound Laterality: Right, Medial Cleanser: Soap and Water 3 x Per Week/30 Days Discharge Instructions: May shower and wash wound with dial antibacterial soap and water prior to dressing change. Cleanser: Vashe 5.8 (oz) 3 x Per Week/30 Days Discharge Instructions: Cleanse the wound with Vashe prior to applying [name] clean dressing using gauze sponges, not tissue or cotton balls. [name], [name] ([id number]) [id number] Page 11 of 12 Peri-Wound Care: Zinc Oxide Ointment 30g tube 3 x Per Week/30 Days Discharge Instructions: Apply Zinc Oxide TO MACERATED AREAS AROUND THE WOUND. Peri-Wound Care: Sween Lotion (Moisturizing lotion) 3 x Per Week/30 Days Discharge Instructions: Apply moisturizing lotion as directed Prim Dressing: Maxorb Extra Ag+ Alginate Dressing, 4x4.75 (in/in) 3 x Per Week/30 Days ary Discharge Instructions: Apply to wound bed as instructed Secondary Dressing: ABD Pad, 8x10 3 x Per Week/30 Days Discharge Instructions: Apply over primary dressing as directed. Com pression Wrap: Kerlix Roll 4.5x3.1 (  in/yd) 3 x Per Week/30 Days Discharge Instructions: Apply Kerlix and Coban compression as directed. Com pression Wrap: Coban Self-Adherent Wrap 4x5 (in/yd) 3 x Per Week/30 Days Discharge Instructions: Apply over Kerlix as directed. 1. There is no choice about the primary dressing to the wounds and has to be silver alginate because of the overall wound area and the fact he is in [name] skilled facility 2. Still under kerlix Coban compression 3. We have asked the facility to vigorously wash the wound surface hopefully with Vashe  solution Electronic Signature(s) Signed: 01/29/2023 11:31:33 AM By: [name] Stall RN, BSN Signed: 01/29/2023 11:42:05 AM By: [name] Najjar MD Previous Signature: 01/28/2023 3:53:26 PM Version By: [name] Najjar MD Entered By: [name] on 01/29/2023 11:29:11 -------------------------------------------------------------------------------- SuperBill Details Patient Name: Date of Service: [name], [name] 01/28/2023 Medical Record Number: [id number] Patient Account Number: [id number] Date of Birth/Sex: Treating RN: 1955/11/24 (67 y.o. Harlon Flor, Millard.Loa Primary Care Provider: Renford [name] Other Clinician: Referring Provider: Treating Provider/Extender: [name] in Treatment: 6 Diagnosis Coding ICD-10 Codes Code Description [id number] Chronic venous hypertension (idiopathic) with ulcer and inflammation of bilateral lower extremity 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ed L97.912 Non-pressure chronic ulcer of unspecified part of right lower leg with fat layer exposed I89.0 Lymphedema, not elsewhere classified E44.0 Moderate protein-calorie malnutrition E11.622 Type 2 diabetes mellitus with other skin ulcer I50.32 Chronic diastolic (congestive) heart failure Facility Procedures : CPT4 Code: [id number] Description: 11042 - DEB SUBQ TISSUE 20 SQ CM/< ICD-10 Diagnosis Description L97.912 Non-pressure chronic ulcer of unspecified part of right lower leg with fat laye 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 Modifier: r exposed ed Quantity: 1 : CPT4 Code: [id number] Description: 11045 - DEB SUBQ TISS EA ADDL 20CM ICD-10 Diagnosis Description 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 Modifier: ed Quantity: 8 Physician Procedures : CPT4 Code Description Modifier [id number] 11042 - WC PHYS SUBQ TISS 20 SQ CM ICD-10 Diagnosis Description  L97.912 Non-pressure chronic ulcer of unspecified part of right lower leg with fat layer exposed L97.822 Non-pressure chronic ulcer of other part of  left lower leg with fat layer exposed Quantity: 1 : [id number] 11045 - WC PHYS SUBQ TISS EA ADDL 20 CM ICD-10 Diagnosis Description 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ed [name], [name] ([id number]) [id number] Page Quantity: 8 12 of 12 Electronic Signature(s) Signed: 01/28/2023 3:53:26 PM By: [name] Najjar MD Entered By: [name] on 01/28/2023 13:50:09

## 2023-01-29 NOTE — Progress Notes (Signed)
[name], [name] ([id number]) [id number] Page 1 of 9 Visit Report for 01/28/2023 Arrival Information Details [name], [name] 01/28/2023 12:45 PM Medical Record Number: [id number] Patient Account Number: [id number] Date of Birth/Sex: Treating RN: 01-16-1956 (67 y.o. M) Primary Care [name]: [name] Other Clinician: Referring [name]: Treating [name]/Extender: [name] in Treatment: 6 Visit Information History Since Last Visit Added or deleted any medications: No Patient Arrived: Wheel Chair Any new allergies or adverse reactions: No Arrival Time: 12:37 Had [name] fall or experienced change in No Accompanied By: self activities of daily living that may affect Transfer Assistance: None risk of falls: Patient Requires Transmission-Based Precautions: No Signs or symptoms of abuse/neglect since last visito No Patient Has Alerts: No Hospitalized since last visit: No Implantable device outside of the clinic excluding No cellular tissue based products placed in the center since last visit: Has Dressing in Place as Prescribed: Yes Has Compression in Place as Prescribed: Yes Pain Present Now: Yes Electronic Signature(s) Signed: 01/29/2023 4:18:40 PM By: [name] Entered By: [name] on 01/28/2023 12:43:13 -------------------------------------------------------------------------------- Encounter Discharge Information Details [name], [name] 01/28/2023 12:45 PM Medical Record Number: [id number] Patient Account Number: [id number] Date of Birth/Sex: Treating RN: [birth year] (67 y.o. [name] Primary Care [name]: [name] Other Clinician: Referring [name]: Treating [name]/Extender: [name] in Treatment: 6 Encounter Discharge Information Items Post Procedure Vitals Discharge Condition: Stable Temperature (F):  97.6 Ambulatory Status: Wheelchair Pulse (bpm): 76 Discharge Destination: Skilled Nursing Facility Respiratory Rate (breaths/min): 18 Telephoned: No Blood Pressure (mmHg): 146/72 Orders Sent: Yes Transportation: Private Auto Accompanied By: self Schedule Follow-up Appointment: Yes Clinical Summary of Care: Electronic Signature(s) Signed: 01/28/2023 4:32:52 PM By: [name] Stall RN, BSN Entered By: [name] on 01/28/2023 13:10:32 -------------------------------------------------------------------------------- Lower Extremity Assessment Details [name], [name] 01/28/2023 12:45 PM Medical Record Number: [id number] Patient Account Number: [id number] Date of Birth/Sex: Treating RN: [birth year] (67 y.o. M) Primary Care [name]: [name] Other Clinician: Referring [name]: Treating [name]/Extender: [name] in Treatment: 6 [name], [name] ([id number]) [id number].pdf Page 2 of 9 Edema Assessment Assessed: [Left: No] [Right: No] Edema: [Left: Yes] [Right: Yes] Calf Left: Right: Point of Measurement: From Medial Instep 42 cm 42 cm Ankle Left: Right: Point of Measurement: From Medial Instep 25.5 cm 26.5 cm Electronic Signature(s) Signed: 01/29/2023 4:18:40 PM By: [name] Entered By: [name] on 01/28/2023 12:45:57 -------------------------------------------------------------------------------- Multi Wound Chart Details [name], [name] 01/28/2023 12:45 PM Medical Record Number: [id number] Patient Account Number: [id number] Date of Birth/Sex: Treating RN: 23-Apr-1956 (67 y.o. M) Primary Care [name]: [name] Other Clinician: Referring [name]: Treating [name]/Extender: [name] in Treatment: 6 Vital Signs Height(in): Pulse(bpm): 76 Weight(lbs): 245.6 Blood Pressure(mmHg): 146/72 Body Mass  Index(BMI): Temperature(F): 97.6 Respiratory Rate(breaths/min): 18 [30:Photos:] [[name]:[name]] Left, Posterior Lower Leg Right, Medial Lower Leg [name] Wound Location: Gradually Appeared Gradually Appeared [name] Wounding Event: Venous Leg Ulcer Venous Leg Ulcer [name] Primary Etiology: Anemia, Chronic Obstructive Anemia, Chronic Obstructive [name] Comorbid History: Pulmonary Disease (COPD), Sleep Pulmonary Disease (COPD), Sleep Apnea, Congestive Heart Failure, Apnea, Congestive Heart Failure, Coronary Artery Disease, Coronary Artery Disease, Hypertension, Peripheral Venous Hypertension, Peripheral Venous Disease, Type II Diabetes, Disease, Type II Diabetes, Osteoarthritis, Neuropathy Osteoarthritis, Neuropathy 09/23/2020 10/31/2022 [name] Date Acquired: 6 6 [name] Weeks of Treatment: Open Open [name] Wound Status: No No [name] Wound Recurrence:  17x16.5x0.3 5x2x0.1 [name] Measurements L x W x D (cm) 220.304 7.854 [name] [name] (cm) : rea 66.091 0.785 [name] Volume (cm) : 48.30% -66.70% [name] % Reduction in Area: 22.40% 16.70% [name] % Reduction in Volume: Full Thickness Without Exposed Full Thickness Without Exposed [name] Classification: Support Structures Support Structures Large Medium [name] Exudate Amount: Serosanguineous Serosanguineous [name] Exudate Type: red, brown red, brown [name] Exudate Color: Distinct, outline attached Distinct, outline attached [name] Wound Margin: Small (1-33%) Medium (34-66%) [name] Granulation Amount: Red Red [name] Granulation Quality: Large (67-100%) Medium (34-66%) [name] Necrotic Amount: Fat Layer (Subcutaneous Tissue): Yes Fat Layer (Subcutaneous Tissue): Yes [name] Exposed Structures: [name] ([id number]) [id number] Page 3 of 9 Fascia: No Tendon: No Muscle: No Joint: No Bone: No Small (1-33%) Small (1-33%) [name] Epithelialization: Debridement - Excisional Debridement - Excisional [name] Debridement: 12:55 12:55 [name] Pre-procedure Verification/Time  Out Taken: Lidocaine 4% Topical Solution Lidocaine 4% Topical Solution [name] Pain Control: Subcutaneous, Slough Subcutaneous, Slough [name] Tissue Debrided: Skin/Subcutaneous Tissue Skin/Subcutaneous Tissue [name] Level: 220.19 7.85 [name] Debridement [name] (sq cm): rea Curette Curette [name] Instrument: Moderate Moderate [name] Bleeding: Pressure Pressure [name] Hemostasis [name] chieved: 0 0 [name] Procedural Pain: 0 0 [name] Post Procedural Pain: Procedure was tolerated well Procedure was tolerated well [name] Debridement Treatment Response: 17x16.5x0.3 5x2x0.1 [name] Post Debridement Measurements L x W x D (cm) 66.091 0.785 [name] Post Debridement Volume: (cm) Excoriation: No Excoriation: No [name] Periwound Skin Texture: Induration: No Induration: No Callus: No Callus: No Crepitus: No Crepitus: No Rash: No Rash: No Scarring: No Scarring: No Maceration: No Dry/Scaly: Yes [name] Periwound Skin Moisture: Dry/Scaly: No Maceration: No Atrophie Blanche: No Hemosiderin Staining: Yes [name] Periwound Skin Color: Cyanosis: No Atrophie Blanche: No Ecchymosis: No Cyanosis: No Erythema: No Ecchymosis: No Hemosiderin Staining: No Erythema: No Mottled: No Mottled: No Pallor: No Pallor: No Rubor: No Rubor: No No Abnormality [name] [name] Temperature: Debridement Debridement [name] Procedures Performed: Treatment Notes Wound #30 (Lower Leg) Wound Laterality: Left, Posterior Cleanser Soap and Water Discharge Instruction: May shower and wash wound with dial antibacterial soap and water prior to dressing change. Vashe 5.8 (oz) Discharge Instruction: Cleanse the wound with Vashe prior to applying [name] clean dressing using gauze sponges, not tissue or cotton [name]. Peri-Wound Care Zinc Oxide Ointment 30g tube Discharge Instruction: Apply Zinc Oxide TO MACERATED AREAS AROUND THE WOUND. Sween Lotion (Moisturizing lotion) Discharge Instruction: Apply moisturizing lotion as directed Topical Primary Dressing Maxorb  Extra Ag+ Alginate Dressing, 4x4.75 (in/in) Discharge Instruction: Apply to wound bed as instructed Secondary Dressing ABD Pad, 8x10 Discharge Instruction: Apply over primary dressing as directed. Secured With Compression Wrap Kerlix Roll 4.5x3.1 (in/yd) Discharge Instruction: Apply Kerlix and Coban compression as directed. Coban Self-Adherent Wrap 4x5 (in/yd) Discharge Instruction: Apply over Kerlix as directed. Compression Stockings Add-Ons Wound #31 (Lower Leg) Wound Laterality: Right, Medial Valcarcel, Melanie ([id number]) [id number] Page 4 of 9 Cleanser Soap and Water Discharge Instruction: May shower and wash wound with dial antibacterial soap and water prior to dressing change. Vashe 5.8 (oz) Discharge Instruction: Cleanse the wound with Vashe prior to applying [name] clean dressing using gauze sponges, not tissue or cotton [name]. Peri-Wound Care Zinc Oxide Ointment 30g tube Discharge Instruction: Apply Zinc Oxide TO MACERATED AREAS AROUND THE WOUND. Sween Lotion (Moisturizing lotion) Discharge Instruction: Apply moisturizing lotion as directed Topical Primary Dressing Maxorb Extra Ag+ Alginate Dressing, 4x4.75 (in/in) Discharge Instruction: Apply to wound bed as instructed Secondary Dressing ABD Pad, 8x10 Discharge Instruction: Apply over primary dressing as directed. Secured With Compression Wrap  Kerlix Roll 4.5x3.1 (in/yd) Discharge Instruction: Apply Kerlix and Coban compression as directed. Coban Self-Adherent Wrap 4x5 (in/yd) Discharge Instruction: Apply over Kerlix as directed. Compression Stockings Add-Ons Electronic Signature(s) Signed: 01/28/2023 3:53:26 PM By: Baltazar Najjar MD Entered By: Baltazar Najjar on 01/28/2023 13:24:26 -------------------------------------------------------------------------------- Multi-Disciplinary Care Plan Details [name], [name] 01/28/2023 12:45 PM Medical Record Number:  [id number] Patient Account Number: [id number] Date of Birth/Sex: Treating RN: February 28, 1956 (67 y.o. [name] Primary Care Jerardo Costabile: [name] Other Clinician: Referring Parrish Daddario: Treating Valisa Karpel/Extender: [name] in Treatment: 6 Active Inactive Pain, Acute or Chronic Nursing Diagnoses: Pain, acute or chronic: actual or potential Potential alteration in comfort, pain Goals: Patient will verbalize adequate pain control and receive pain control interventions during procedures as needed Date Initiated: 12/17/2022 Target Resolution Date: 03/22/2023 Goal Status: Active Interventions: Encourage patient to take pain medications as prescribed Provide education on pain management Reposition patient for comfort Treatment Activities: Administer pain control measures as ordered : 12/17/2022 [name], [name] ([id number]) [id number] Page 5 of 9 Notes: Wound/Skin Impairment Nursing Diagnoses: Knowledge deficit related to ulceration/compromised skin integrity Goals: Patient/caregiver will verbalize understanding of skin care regimen Date Initiated: 12/17/2022 Target Resolution Date: 03/21/2023 Goal Status: Active Interventions: Assess patient/caregiver ability to perform ulcer/skin care regimen upon admission and as needed Assess ulceration(s) every visit Provide education on ulcer and skin care Treatment Activities: Skin care regimen initiated : 12/17/2022 Topical wound management initiated : 12/17/2022 Notes: Electronic Signature(s) Signed: 01/28/2023 4:32:52 PM By: [name] Stall RN, BSN Entered By: [name] on 01/28/2023 13:04:13 -------------------------------------------------------------------------------- Pain Assessment Details [name], [name] 01/28/2023 12:45 PM Medical Record Number: [id number] Patient Account Number: [id number] Date of Birth/Sex: Treating RN: [birth year] (67 y.o.  M) Primary Care Sylina Henion: [name] Other Clinician: Referring Charmin Aguiniga: Treating Kashmere Daywalt/Extender: [name] in Treatment: 6 Active Problems Location of Pain Severity and Description of Pain Patient Has Paino Yes Site Locations Pain Location: Generalized Pain, Pain in Ulcers Rate the pain. Current Pain Level: 8 Pain Management and Medication Current Pain Management: Electronic Signature(s) Signed: 01/29/2023 4:18:40 PM By: [name] Entered By: [name] on 01/28/2023 12:43:47 [name], [name] ([id number]) [id number] Page 6 of 9 -------------------------------------------------------------------------------- Patient/Caregiver Education Details Patient Name: Date of Service: Daleen Bo 5/7/2024andnbsp12:45 PM Medical Record Number: [id number] Patient Account Number: [id number] Date of Birth/Gender: Treating RN: 02-Oct-1955 (67 y.o. [name] Primary Care Physician: [name] Other Clinician: Referring Physician: Treating Physician/Extender: [name] in Treatment: 6 Education Assessment Education Provided To: Patient Education Topics Provided Wound/Skin Impairment: Handouts: Caring for Your Ulcer Methods: Explain/Verbal Responses: Reinforcements needed Electronic Signature(s) Signed: 01/28/2023 4:32:52 PM By: [name] Stall RN, BSN Entered By: [name] on 01/28/2023 13:04:25 -------------------------------------------------------------------------------- Wound Assessment Details [name], [name] 01/28/2023 12:45 PM Medical Record Number: [id number] Patient Account Number: [id number] Date of Birth/Sex: Treating RN: 1956/07/08 (67 y.o. M) Primary Care Jaleel Allen: [name] Other Clinician: Referring Hellen Shanley: Treating Keegan Ducey/Extender: [name] in Treatment: 6 Wound Status Wound Number: 30 Primary Venous  Leg Ulcer Etiology: Wound Location: Left, Posterior Lower Leg Wound Open Wounding Event: Gradually Appeared Status: Date Acquired: 09/23/2020 Comorbid Anemia, Chronic Obstructive Pulmonary Disease (COPD), Sleep Weeks Of Treatment: 6 History: Apnea, Congestive Heart Failure, Coronary Artery Disease, Clustered Wound: No Hypertension, Peripheral Venous Disease, Type II Diabetes, Osteoarthritis, Neuropathy Photos Wound Measurements Length: (cm) 17 Width: (cm) 16.5 Depth: (cm)  0.3 Area: (cm) 220.304 Volume: (cm) 66.091 % Reduction in Area: 48.3% % Reduction in Volume: 22.4% Epithelialization: Small (1-33%) Tunneling: No Undermining: No Wound Description Classification: Full Thickness Without Exposed Support Structures Wound Margin: Distinct, outline attached Exudate Amount: Large [name], [name] ([id number]) Exudate Type: Serosanguineous Exudate Color: red, brown Foul Odor After Cleansing: No Slough/Fibrino Yes [phone].pdf Page 7 of 9 Wound Bed Granulation Amount: Small (1-33%) Exposed Structure Granulation Quality: Red Fat Layer (Subcutaneous Tissue) Exposed: Yes Necrotic Amount: Large (67-100%) Necrotic Quality: Adherent Slough Periwound Skin Texture Texture Color No Abnormalities Noted: No No Abnormalities Noted: No Callus: No Atrophie Blanche: No Crepitus: No Cyanosis: No Excoriation: No Ecchymosis: No Induration: No Erythema: No Rash: No Hemosiderin Staining: No Scarring: No Mottled: No Pallor: No Moisture Rubor: No No Abnormalities Noted: No Dry / Scaly: No Temperature / Pain Maceration: No Temperature: No Abnormality Treatment Notes Wound #30 (Lower Leg) Wound Laterality: Left, Posterior Cleanser Soap and Water Discharge Instruction: May shower and wash wound with dial antibacterial soap and water prior to dressing change. Vashe 5.8 (oz) Discharge Instruction: Cleanse the wound with Vashe prior to applying [name] clean dressing  using gauze sponges, not tissue or cotton [name]. Peri-Wound Care Zinc Oxide Ointment 30g tube Discharge Instruction: Apply Zinc Oxide TO MACERATED AREAS AROUND THE WOUND. Sween Lotion (Moisturizing lotion) Discharge Instruction: Apply moisturizing lotion as directed Topical Primary Dressing Maxorb Extra Ag+ Alginate Dressing, 4x4.75 (in/in) Discharge Instruction: Apply to wound bed as instructed Secondary Dressing ABD Pad, 8x10 Discharge Instruction: Apply over primary dressing as directed. Secured With Compression Wrap Kerlix Roll 4.5x3.1 (in/yd) Discharge Instruction: Apply Kerlix and Coban compression as directed. Coban Self-Adherent Wrap 4x5 (in/yd) Discharge Instruction: Apply over Kerlix as directed. Compression Stockings Add-Ons Electronic Signature(s) Signed: 01/28/2023 4:32:52 PM By: [name] Stall RN, BSN Entered By: [name] on 01/28/2023 12:54:13 -------------------------------------------------------------------------------- Wound Assessment Details [name], [name] 01/28/2023 12:45 PM Medical Record Number: [id number] Patient Account Number: [id number] Date of Birth/Sex: Treating RN: 05-03-1956 (67 y.o. M) [name], [name] ([id number]) [id number] Page 8 of 9 Primary Care Oshen Wlodarczyk: [name] Other Clinician: Referring Verenis Nicosia: Treating Ercel Pepitone/Extender: [name] in Treatment: 6 Wound Status Wound Number: 31 Primary Venous Leg Ulcer Etiology: Wound Location: Right, Medial Lower Leg Wound Open Wounding Event: Gradually Appeared Status: Date Acquired: 10/31/2022 Comorbid Anemia, Chronic Obstructive Pulmonary Disease (COPD), Sleep Weeks Of Treatment: 6 History: Apnea, Congestive Heart Failure, Coronary Artery Disease, Clustered Wound: No Hypertension, Peripheral Venous Disease, Type II Diabetes, Osteoarthritis, Neuropathy Photos Wound Measurements Length: (cm) 5 Width: (cm)  2 Depth: (cm) 0.1 Area: (cm) 7.854 Volume: (cm) 0.785 % Reduction in Area: -66.7% % Reduction in Volume: 16.7% Epithelialization: Small (1-33%) Wound Description Classification: Full Thickness Without Exposed Support Structures Wound Margin: Distinct, outline attached Exudate Amount: Medium Exudate Type: Serosanguineous Exudate Color: red, brown Foul Odor After Cleansing: No Slough/Fibrino No Wound Bed Granulation Amount: Medium (34-66%) Exposed Structure Granulation Quality: Red Fascia Exposed: No Necrotic Amount: Medium (34-66%) Fat Layer (Subcutaneous Tissue) Exposed: Yes Necrotic Quality: Adherent Slough Tendon Exposed: No Muscle Exposed: No Joint Exposed: No Bone Exposed: No Periwound Skin Texture Texture Color No Abnormalities Noted: No No Abnormalities Noted: No Callus: No Atrophie Blanche: No Crepitus: No Cyanosis: No Excoriation: No Ecchymosis: No Induration: No Erythema: No Rash: No Hemosiderin Staining: Yes Scarring: No Mottled: No Pallor: No Moisture Rubor: No No Abnormalities Noted: No Dry / Scaly: Yes Maceration: No Treatment Notes Wound #31 (Lower Leg) Wound Laterality: Right, Medial Cleanser  Soap and Water Discharge Instruction: May shower and wash wound with dial antibacterial soap and water prior to dressing change. Vashe 5.8 (oz) Discharge Instruction: Cleanse the wound with Vashe prior to applying [name] clean dressing using gauze sponges, not tissue or cotton [name]. [name], [name] ([id number]) [id number] Page 9 of 9 Peri-Wound Care Zinc Oxide Ointment 30g tube Discharge Instruction: Apply Zinc Oxide TO MACERATED AREAS AROUND THE WOUND. Sween Lotion (Moisturizing lotion) Discharge Instruction: Apply moisturizing lotion as directed Topical Primary Dressing Maxorb Extra Ag+ Alginate Dressing, 4x4.75 (in/in) Discharge Instruction: Apply to wound bed as instructed Secondary Dressing ABD Pad, 8x10 Discharge  Instruction: Apply over primary dressing as directed. Secured With Compression Wrap Kerlix Roll 4.5x3.1 (in/yd) Discharge Instruction: Apply Kerlix and Coban compression as directed. Coban Self-Adherent Wrap 4x5 (in/yd) Discharge Instruction: Apply over Kerlix as directed. Compression Stockings Add-Ons Electronic Signature(s) Signed: 01/28/2023 4:32:52 PM By: [name] Stall RN, BSN Entered By: [name] on 01/28/2023 12:53:28 -------------------------------------------------------------------------------- Vitals Details [name], [name] 01/28/2023 12:45 PM Medical Record Number: [id number] Patient Account Number: [id number] Date of Birth/Sex: Treating RN: 1956/02/26 (67 y.o. M) Primary Care Bethany Hirt: [name] Other Clinician: Referring Kendrew Paci: Treating Reign Dziuba/Extender: [name] in Treatment: 6 Vital Signs Time Taken: 12:43 Temperature (F): 97.6 Weight (lbs): 245.6 Pulse (bpm): 76 Respiratory Rate (breaths/min): 18 Blood Pressure (mmHg): 146/72 Reference Range: 80 - 120 mg / dl Electronic Signature(s) Signed: 01/29/2023 4:18:40 PM By: [name] Entered By: [name] on 01/28/2023 12:43:36

## 2023-01-30 DIAGNOSIS — L97929 Non-pressure chronic ulcer of unspecified part of left lower leg with unspecified severity: Secondary | ICD-10-CM | POA: Diagnosis not present

## 2023-01-30 DIAGNOSIS — L97919 Non-pressure chronic ulcer of unspecified part of right lower leg with unspecified severity: Secondary | ICD-10-CM | POA: Diagnosis not present

## 2023-02-13 DIAGNOSIS — Z043 Encounter for examination and observation following other accident: Secondary | ICD-10-CM | POA: Diagnosis not present

## 2023-02-26 DIAGNOSIS — I509 Heart failure, unspecified: Secondary | ICD-10-CM | POA: Diagnosis not present

## 2023-02-26 DIAGNOSIS — J449 Chronic obstructive pulmonary disease, unspecified: Secondary | ICD-10-CM | POA: Diagnosis not present

## 2023-02-26 DIAGNOSIS — I10 Essential (primary) hypertension: Secondary | ICD-10-CM | POA: Diagnosis not present

## 2023-02-26 DIAGNOSIS — E119 Type 2 diabetes mellitus without complications: Secondary | ICD-10-CM | POA: Diagnosis not present

## 2023-02-28 DIAGNOSIS — I5040 Unspecified combined systolic (congestive) and diastolic (congestive) heart failure: Secondary | ICD-10-CM | POA: Diagnosis not present

## 2023-02-28 DIAGNOSIS — E440 Moderate protein-calorie malnutrition: Secondary | ICD-10-CM | POA: Diagnosis not present

## 2023-02-28 DIAGNOSIS — I10 Essential (primary) hypertension: Secondary | ICD-10-CM | POA: Diagnosis not present

## 2023-02-28 DIAGNOSIS — E119 Type 2 diabetes mellitus without complications: Secondary | ICD-10-CM | POA: Diagnosis not present

## 2023-02-28 DIAGNOSIS — L03116 Cellulitis of left lower limb: Secondary | ICD-10-CM | POA: Diagnosis not present

## 2023-02-28 DIAGNOSIS — I872 Venous insufficiency (chronic) (peripheral): Secondary | ICD-10-CM | POA: Diagnosis not present

## 2023-03-03 DIAGNOSIS — D649 Anemia, unspecified: Secondary | ICD-10-CM | POA: Diagnosis not present

## 2023-03-03 DIAGNOSIS — E1165 Type 2 diabetes mellitus with hyperglycemia: Secondary | ICD-10-CM | POA: Diagnosis not present

## 2023-03-11 ENCOUNTER — Encounter (HOSPITAL_BASED_OUTPATIENT_CLINIC_OR_DEPARTMENT_OTHER): Payer: Medicare HMO | Attending: Internal Medicine | Admitting: Internal Medicine

## 2023-03-11 DIAGNOSIS — I87333 Chronic venous hypertension (idiopathic) with ulcer and inflammation of bilateral lower extremity: Secondary | ICD-10-CM | POA: Insufficient documentation

## 2023-03-11 DIAGNOSIS — J449 Chronic obstructive pulmonary disease, unspecified: Secondary | ICD-10-CM | POA: Diagnosis not present

## 2023-03-11 DIAGNOSIS — E440 Moderate protein-calorie malnutrition: Secondary | ICD-10-CM | POA: Insufficient documentation

## 2023-03-11 DIAGNOSIS — I2510 Atherosclerotic heart disease of native coronary artery without angina pectoris: Secondary | ICD-10-CM | POA: Diagnosis not present

## 2023-03-11 DIAGNOSIS — E11622 Type 2 diabetes mellitus with other skin ulcer: Secondary | ICD-10-CM | POA: Diagnosis not present

## 2023-03-11 DIAGNOSIS — I890 Lymphedema, not elsewhere classified: Secondary | ICD-10-CM | POA: Diagnosis not present

## 2023-03-11 DIAGNOSIS — L97912 Non-pressure chronic ulcer of unspecified part of right lower leg with fat layer exposed: Secondary | ICD-10-CM | POA: Insufficient documentation

## 2023-03-11 DIAGNOSIS — L97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ed: Secondary | ICD-10-CM | POA: Insufficient documentation

## 2023-03-11 DIAGNOSIS — I5032 Chronic diastolic (congestive) heart failure: Secondary | ICD-10-CM | POA: Insufficient documentation

## 2023-03-11 DIAGNOSIS — E1140 Type 2 diabetes mellitus with diabetic neuropathy, unspecified: Secondary | ICD-10-CM | POA: Diagnosis not present

## 2023-03-11 NOTE — Progress Notes (Signed)
Fortin, Jaymarion ([id number]) [id number] Page 1 of 9 Visit Report for 03/11/2023 Arrival Information Details [name], [name] 03/11/2023 12:45 PM Medical Record Number: [id number] Patient Account Number: [id number] Date of Birth/Sex: Treating RN: 1955-11-28 (67 y.o. M) Primary Care Hadli Vandemark: Renford Dills Other Clinician: Referring Geanette Buonocore: Treating Lorin Gawron/Extender: Dione Plover in Treatment: 12 Visit Information History Since Last Visit All ordered tests and consults were completed: No Patient Arrived: Wheel Chair Added or deleted any medications: No Arrival Time: 12:44 Any new allergies or adverse reactions: No Accompanied By: aide Had a fall or experienced change in No Transfer Assistance: Manual activities of daily living that may affect Patient Identification Verified: Yes risk of falls: Secondary Verification Process Completed: Yes Signs or symptoms of abuse/neglect since last visito No Patient Requires Transmission-Based Precautions: No Hospitalized since last visit: No Patient Has Alerts: No Implantable device outside of the clinic excluding No cellular tissue based products placed in the center since last visit: Pain Present Now: No Electronic Signature(s) Signed: 03/11/2023 1:48:49 PM By: Dayton Scrape Entered By: Dayton Scrape on 03/11/2023 12:46:07 -------------------------------------------------------------------------------- Encounter Discharge Information Details [name], [name] 03/11/2023 12:45 PM Medical Record Number: [id number] Patient Account Number: [id number] Date of Birth/Sex: Treating RN: 01/07/1956 (67 y.o. [name] Primary Care Myrel Rappleye: Renford Dills Other Clinician: Referring Olamae Ferrara: Treating Celise Bazar/Extender: Dione Plover in Treatment: 12 Encounter Discharge Information Items Post Procedure  Vitals Discharge Condition: Stable Temperature (F): 98.3 Ambulatory Status: Wheelchair Pulse (bpm): 73 Discharge Destination: Home Respiratory Rate (breaths/min): 20 Transportation: Private Auto Blood Pressure (mmHg): 129/74 Accompanied By: caregiver Schedule Follow-up Appointment: Yes Clinical Summary of Care: Electronic Signature(s) Signed: 03/11/2023 3:42:23 PM By: Shawn Stall RN, BSN Entered By: Shawn Stall on 03/11/2023 13:44:54 Kallio, Sylvan ([id number]) [id number] Page 2 of 9 -------------------------------------------------------------------------------- Lower Extremity Assessment Details [name], [name] 03/11/2023 12:45 PM Medical Record Number: [id number] Patient Account Number: [id number] Date of Birth/Sex: Treating RN: 09-Aug-1956 (67 y.o. M) Primary Care Nyhla Mountjoy: Renford Dills Other Clinician: Referring Yandiel Bergum: Treating Xavier Fournier/Extender: Dione Plover in Treatment: 12 Edema Assessment Assessed: Kyra Searles: No] Franne Forts: No] Edema: [Left: Yes] [Right: Yes] Calf Left: Right: Point of Measurement: From Medial Instep 42 cm 43 cm Ankle Left: Right: Point of Measurement: From Medial Instep 25.5 cm 25 cm Electronic Signature(s) Signed: 03/11/2023 4:35:45 PM By: Thayer Dallas Entered By: Thayer Dallas on 03/11/2023 13:09:35 -------------------------------------------------------------------------------- Multi Wound Chart Details [name], [name] 03/11/2023 12:45 PM Medical Record Number: [id number] Patient Account Number: [id number] Date of Birth/Sex: Treating RN: July 21, 1956 (67 y.o. M) Primary Care Selina Tapper: Renford Dills Other Clinician: Referring Sieanna Vanstone: Treating Gurtha Picker/Extender: Dione Plover in Treatment: 12 Vital Signs Height(in): Pulse(bpm): 73 Weight(lbs): 245.6 Blood Pressure(mmHg): 129/74 Body Mass  Index(BMI): Temperature(F): 98.3 Respiratory Rate(breaths/min): 20 [30:Photos:] [N/A:N/A [id number] Page 3 of 9] Left, Posterior Lower Leg Right, Medial Lower Leg N/A Wound Location: Gradually Appeared Gradually Appeared N/A Wounding Event: Venous Leg Ulcer Venous Leg Ulcer N/A Primary Etiology: Anemia, Chronic Obstructive Anemia, Chronic Obstructive N/A Comorbid History: Pulmonary Disease (COPD), Sleep Pulmonary Disease (COPD), Sleep Apnea, Congestive Heart Failure, Apnea, Congestive Heart Failure, Coronary Artery Disease, Coronary Artery Disease, Hypertension, Peripheral Venous Hypertension, Peripheral Venous Disease, Type II Diabetes, Disease, Type II Diabetes, Osteoarthritis, Neuropathy Osteoarthritis, Neuropathy 09/23/2020 10/31/2022 N/A Date Acquired: 12 12 N/A Weeks of Treatment: Open Open N/A Wound Status: No No N/A Wound  Recurrence: 19x14x0.3 5x2.6x0.2 N/A Measurements L x W x D (cm) 208.916 10.21 N/A A (cm) : rea 62.675 2.042 N/A Volume (cm) : 51.00% -116.70% N/A % Reduction in A rea: 26.50% -116.80% N/A % Reduction in Volume: Full Thickness Without Exposed Full Thickness Without Exposed N/A Classification: Support Structures Support Structures Large Medium N/A Exudate A mount: Serosanguineous Serosanguineous N/A Exudate Type: red, brown red, brown N/A Exudate Color: Distinct, outline attached Distinct, outline attached N/A Wound Margin: Small (1-33%) Medium (34-66%) N/A Granulation A mount: Red Red N/A Granulation Quality: Large (67-100%) Medium (34-66%) N/A Necrotic A mount: Fat Layer (Subcutaneous Tissue): Yes Fat Layer (Subcutaneous Tissue): Yes N/A Exposed Structures: Fascia: No Tendon: No Muscle: No Joint: No Bone: No Small (1-33%) Small (1-33%) N/A Epithelialization: Debridement - Excisional Debridement - Excisional N/A Debridement: Pre-procedure Verification/Time Out 13:25 13:25 N/A Taken: Lidocaine 4% Topical  Solution Lidocaine 4% Topical Solution N/A Pain Control: Subcutaneous, Slough Subcutaneous, Slough N/A Tissue Debrided: Skin/Subcutaneous Tissue Skin/Subcutaneous Tissue N/A Level: 208.81 10.2 N/A Debridement A (sq cm): rea Curette Curette N/A Instrument: Swab Swab N/A Specimen: 1 1 N/A Number of Specimens Taken: Moderate Moderate N/A Bleeding: Pressure Pressure N/A Hemostasis A chieved: 0 0 N/A Procedural Pain: 0 0 N/A Post Procedural Pain: Procedure was tolerated well Procedure was tolerated well N/A Debridement Treatment Response: 19x14x0.3 5x2.6x0.2 N/A Post Debridement Measurements L x W x D (cm) 62.675 2.042 N/A Post Debridement Volume: (cm) Excoriation: No Excoriation: No N/A Periwound Skin Texture: Induration: No Induration: No Callus: No Callus: No Crepitus: No Crepitus: No Rash: No Rash: No Scarring: No Scarring: No Maceration: No Dry/Scaly: Yes N/A Periwound Skin Moisture: Dry/Scaly: No Maceration: No Atrophie Blanche: No Hemosiderin Staining: Yes N/A Periwound Skin Color: Cyanosis: No Atrophie Blanche: No Ecchymosis: No Cyanosis: No Erythema: No Ecchymosis: No Hemosiderin Staining: No Erythema: No Mottled: No Mottled: No Pallor: No Pallor: No Rubor: No Rubor: No No Abnormality N/A N/A Temperature: Debridement Debridement N/A Procedures Performed: Treatment Notes Electronic Signature(s) Signed: 03/11/2023 3:45:24 PM By: Geralyn Corwin DO Entered By: Geralyn Corwin on 03/11/2023 13:41:07 Gorr, Amiel ([id number]) [id number] Page 4 of 9 -------------------------------------------------------------------------------- Multi-Disciplinary Care Plan Details [name], [name] 03/11/2023 12:45 PM Medical Record Number: [id number] Patient Account Number: [id number] Date of Birth/Sex: Treating RN: [birth year] (67 y.o. [name] Primary Care Trek Kimball: Renford Dills Other  Clinician: Referring Lakysha Kossman: Treating Isaura Schiller/Extender: Dione Plover in Treatment: 12 Active Inactive Pain, Acute or Chronic Nursing Diagnoses: Pain, acute or chronic: actual or potential Potential alteration in comfort, pain Goals: Patient will verbalize adequate pain control and receive pain control interventions during procedures as needed Date Initiated: 12/17/2022 Target Resolution Date: 04/25/2023 Goal Status: Active Interventions: Encourage patient to take pain medications as prescribed Provide education on pain management Reposition patient for comfort Treatment Activities: Administer pain control measures as ordered : 12/17/2022 Notes: Wound/Skin Impairment Nursing Diagnoses: Knowledge deficit related to ulceration/compromised skin integrity Goals: Patient/caregiver will verbalize understanding of skin care regimen Date Initiated: 12/17/2022 Target Resolution Date: 04/25/2023 Goal Status: Active Interventions: Assess patient/caregiver ability to perform ulcer/skin care regimen upon admission and as needed Assess ulceration(s) every visit Provide education on ulcer and skin care Treatment Activities: Skin care regimen initiated : 12/17/2022 Topical wound management initiated : 12/17/2022 Notes: Electronic Signature(s) Signed: 03/11/2023 3:42:23 PM By: Shawn Stall RN, BSN Entered By: Shawn Stall on 03/11/2023 13:43:43 -------------------------------------------------------------------------------- Pain Assessment Details [name], [name] 03/11/2023 12:45 PM Kirley, Colvin ([id number])  [phone].pdf Page 5 of 9 Medical Record Number: [id number] Patient Account Number: [id number] Date of Birth/Sex: Treating RN: 1956-06-06 (67 y.o. M) Primary Care Miamor Ayler: Renford Dills Other Clinician: Referring Wanda Cellucci: Treating Jerusalem Brownstein/Extender: Dione Plover in Treatment:  12 Active Problems Location of Pain Severity and Description of Pain Patient Has Paino Yes Site Locations Rate the pain. Current Pain Level: 7 Worst Pain Level: 10 Least Pain Level: 0 Tolerable Pain Level: 2 Pain Management and Medication Current Pain Management: Electronic Signature(s) Signed: 03/11/2023 1:48:49 PM By: Dayton Scrape Entered By: Dayton Scrape on 03/11/2023 12:47:10 -------------------------------------------------------------------------------- Patient/Caregiver Education Details Patient Name: Date of Service: Daleen Bo 6/18/2024andnbsp12:45 PM Medical Record Number: [id number] Patient Account Number: [id number] Date of Birth/Gender: Treating RN: [birth year] (67 y.o. [name] Primary Care Physician: Renford Dills Other Clinician: Referring Physician: Treating Physician/Extender: Dione Plover in Treatment: 12 Education Assessment Education Provided To: Patient Education Topics Provided Wound/Skin Impairment: Handouts: Caring for Your Ulcer Methods: Explain/Verbal Responses: Reinforcements needed Electronic Signature(s) Signed: 03/11/2023 3:42:23 PM By: Shawn Stall RN, BSN Entered By: Shawn Stall on 03/11/2023 13:43:54 Boyland, Tadarius ([id number]) [id number] Page 6 of 9 -------------------------------------------------------------------------------- Wound Assessment Details [name], [name] 03/11/2023 12:45 PM Medical Record Number: [id number] Patient Account Number: [id number] Date of Birth/Sex: Treating RN: 1956-09-08 (67 y.o. M) Primary Care Danayah Smyre: Renford Dills Other Clinician: Referring Suede Greenawalt: Treating Thor Nannini/Extender: Dione Plover in Treatment: 12 Wound Status Wound Number: 30 Primary Venous Leg Ulcer Etiology: Wound Location: Left, Posterior Lower Leg Wound Open Wounding Event: Gradually Appeared Status: Date Acquired:  09/23/2020 Comorbid Anemia, Chronic Obstructive Pulmonary Disease (COPD), Sleep Weeks Of Treatment: 12 History: Apnea, Congestive Heart Failure, Coronary Artery Disease, Clustered Wound: No Hypertension, Peripheral Venous Disease, Type II Diabetes, Osteoarthritis, Neuropathy Photos Wound Measurements Length: (cm) 19 Width: (cm) 14 Depth: (cm) 0.3 Area: (cm) 208.916 Volume: (cm) 62.675 % Reduction in Area: 51% % Reduction in Volume: 26.5% Epithelialization: Small (1-33%) Tunneling: No Undermining: No Wound Description Classification: Full Thickness Without Exposed Suppor Wound Margin: Distinct, outline attached Exudate Amount: Large Exudate Type: Serosanguineous Exudate Color: red, brown t Structures Foul Odor After Cleansing: No Slough/Fibrino Yes Wound Bed Granulation Amount: Small (1-33%) Exposed Structure Granulation Quality: Red Fat Layer (Subcutaneous Tissue) Exposed: Yes Necrotic Amount: Large (67-100%) Necrotic Quality: Adherent Slough Periwound Skin Texture Texture Color No Abnormalities Noted: No No Abnormalities Noted: No Callus: No Atrophie Blanche: No Crepitus: No Cyanosis: No Excoriation: No Ecchymosis: No Induration: No Erythema: No Rash: No Hemosiderin Staining: No Scarring: No Mottled: No Pallor: No Moisture Rubor: No No Abnormalities Noted: No Dry / Scaly: No Temperature / Pain Maceration: No Temperature: No Abnormality Guillen, Ronnie ([id number]) [id number] Page 7 of 9 Treatment Notes Wound #30 (Lower Leg) Wound Laterality: Left, Posterior Cleanser Soap and Water Discharge Instruction: May shower and wash wound with dial antibacterial soap and water prior to dressing change. Peri-Wound Care Topical Primary Dressing Dakin's Solution 0.25%, 16 (oz) Discharge Instruction: Moisten gauze with Dakin's solution Secondary Dressing ABD Pad, 8x10 Discharge Instruction: Apply over primary dressing as directed. Secured  With [employer], 4.5x3.1 (in/yd) Discharge Instruction: Secure with Kerlix as directed. Compression Wrap tubigrip size E Discharge Instruction: apply daily one layer size E. Compression Stockings Add-Ons Electronic Signature(s) Signed: 03/11/2023 4:35:45 PM By: Thayer Dallas Entered By: Thayer Dallas on 03/11/2023 13:12:18 -------------------------------------------------------------------------------- Wound Assessment Details [name], [name] 03/11/2023 12:45 PM Medical Record Number: [id number] Patient  Account Number: [id number] Date of Birth/Sex: Treating RN: 05-Mar-1956 (67 y.o. M) Primary Care Treyton Slimp: Renford Dills Other Clinician: Referring Timaya Bojarski: Treating Isaiahs Chancy/Extender: Dione Plover in Treatment: 12 Wound Status Wound Number: 31 Primary Venous Leg Ulcer Etiology: Wound Location: Right, Medial Lower Leg Wound Open Wounding Event: Gradually Appeared Status: Date Acquired: 10/31/2022 Comorbid Anemia, Chronic Obstructive Pulmonary Disease (COPD), Sleep Weeks Of Treatment: 12 History: Apnea, Congestive Heart Failure, Coronary Artery Disease, Clustered Wound: No Hypertension, Peripheral Venous Disease, Type II Diabetes, Osteoarthritis, Neuropathy Photos Daughdrill, Tome ([id number]) [id number] Page 8 of 9 Wound Measurements Length: (cm) 5 Width: (cm) 2.6 Depth: (cm) 0.2 Area: (cm) 10.21 Volume: (cm) 2.042 % Reduction in Area: -116.7% % Reduction in Volume: -116.8% Epithelialization: Small (1-33%) Tunneling: No Undermining: No Wound Description Classification: Full Thickness Without Exposed Support Structures Wound Margin: Distinct, outline attached Exudate Amount: Medium Exudate Type: Serosanguineous Exudate Color: red, brown Foul Odor After Cleansing: No Slough/Fibrino No Wound Bed Granulation Amount: Medium (34-66%) Exposed Structure Granulation Quality: Red Fascia  Exposed: No Necrotic Amount: Medium (34-66%) Fat Layer (Subcutaneous Tissue) Exposed: Yes Necrotic Quality: Adherent Slough Tendon Exposed: No Muscle Exposed: No Joint Exposed: No Bone Exposed: No Periwound Skin Texture Texture Color No Abnormalities Noted: No No Abnormalities Noted: No Callus: No Atrophie Blanche: No Crepitus: No Cyanosis: No Excoriation: No Ecchymosis: No Induration: No Erythema: No Rash: No Hemosiderin Staining: Yes Scarring: No Mottled: No Pallor: No Moisture Rubor: No No Abnormalities Noted: No Dry / Scaly: Yes Maceration: No Treatment Notes Wound #31 (Lower Leg) Wound Laterality: Right, Medial Cleanser Soap and Water Discharge Instruction: May shower and wash wound with dial antibacterial soap and water prior to dressing change. Peri-Wound Care Topical Primary Dressing Dakin's Solution 0.25%, 16 (oz) Discharge Instruction: Moisten gauze with Dakin's solution Secondary Dressing ABD Pad, 8x10 Discharge Instruction: Apply over primary dressing as directed. Secured With [employer], 4.5x3.1 (in/yd) Discharge Instruction: Secure with Kerlix as directed. Compression Wrap tubigrip size E Discharge Instruction: apply daily one layer size E. Compression Stockings Add-Ons Electronic Signature(s) Signed: 03/11/2023 4:35:45 PM By: Thayer Dallas Entered By: Thayer Dallas on 03/11/2023 13:13:33 Valls, Polk ([id number]) [id number] Page 9 of 9 -------------------------------------------------------------------------------- Vitals Details [name], [name] 03/11/2023 12:45 PM Medical Record Number: [id number] Patient Account Number: [id number] Date of Birth/Sex: Treating RN: 1956/06/27 (67 y.o. M) Primary Care Kacy Conely: Renford Dills Other Clinician: Referring Shalon Salado: Treating Rajah Lamba/Extender: Dione Plover in Treatment: 12 Vital Signs Time Taken:  12:46 Temperature (F): 98.3 Weight (lbs): 245.6 Pulse (bpm): 73 Respiratory Rate (breaths/min): 20 Blood Pressure (mmHg): 129/74 Reference Range: 80 - 120 mg / dl Electronic Signature(s) Signed: 03/11/2023 1:48:49 PM By: Dayton Scrape Entered By: Dayton Scrape on 03/11/2023 12:46:44

## 2023-03-13 DIAGNOSIS — L97919 Non-pressure chronic ulcer of unspecified part of right lower leg with unspecified severity: Secondary | ICD-10-CM | POA: Diagnosis not present

## 2023-03-13 DIAGNOSIS — L97929 Non-pressure chronic ulcer of unspecified part of left lower leg with unspecified severity: Secondary | ICD-10-CM | POA: Diagnosis not present

## 2023-03-18 ENCOUNTER — Encounter (HOSPITAL_BASED_OUTPATIENT_CLINIC_OR_DEPARTMENT_OTHER): Payer: Medicare HMO | Admitting: Internal Medicine

## 2023-03-18 DIAGNOSIS — I2510 Atherosclerotic heart disease of native coronary artery without angina pectoris: Secondary | ICD-10-CM | POA: Diagnosis not present

## 2023-03-18 DIAGNOSIS — L97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ed: Secondary | ICD-10-CM

## 2023-03-18 DIAGNOSIS — I87333 Chronic venous hypertension (idiopathic) with ulcer and inflammation of bilateral lower extremity: Secondary | ICD-10-CM

## 2023-03-18 DIAGNOSIS — E11622 Type 2 diabetes mellitus with other skin ulcer: Secondary | ICD-10-CM

## 2023-03-18 DIAGNOSIS — I890 Lymphedema, not elsewhere classified: Secondary | ICD-10-CM | POA: Diagnosis not present

## 2023-03-18 DIAGNOSIS — J449 Chronic obstructive pulmonary disease, unspecified: Secondary | ICD-10-CM | POA: Diagnosis not present

## 2023-03-18 DIAGNOSIS — I5032 Chronic diastolic (congestive) heart failure: Secondary | ICD-10-CM | POA: Diagnosis not present

## 2023-03-18 DIAGNOSIS — L97912 Non-pressure chronic ulcer of unspecified part of right lower leg with fat layer exposed: Secondary | ICD-10-CM | POA: Diagnosis not present

## 2023-03-18 DIAGNOSIS — E440 Moderate protein-calorie malnutrition: Secondary | ICD-10-CM | POA: Diagnosis not present

## 2023-03-18 NOTE — Progress Notes (Addendum)
Dauenhauer, Edder ([id number]) [id number] Page 1 of 11 Visit Report for 03/18/2023 Arrival Information Details Patient Name: Date of Service: [name] 03/18/2023 11:00 [name] Medical Record Number: [id number] Patient Account Number: [id number] Date of Birth/Sex: Treating RN: 1956/09/23 (67 y.o. [name] Primary Care Meredeth Furber: Renford Dills Other Clinician: Referring Evangeline Utley: Treating Jonnette Nuon/Extender: Dione Plover in Treatment: 13 Visit Information History Since Last Visit Added or deleted any medications: No Patient Arrived: Wheel Chair Any new allergies or adverse reactions: No Arrival Time: 11:06 Had [name] fall or experienced change in No Accompanied By: caregiver activities of daily living that may affect Transfer Assistance: Manual risk of falls: Patient Identification Verified: Yes Signs or symptoms of abuse/neglect since last visito No Secondary Verification Process Completed: Yes Hospitalized since last visit: No Patient Requires Transmission-Based Precautions: No Implantable device outside of the clinic excluding No Patient Has Alerts: No cellular tissue based products placed in the center since last visit: Has Dressing in Place as Prescribed: Yes Has Compression in Place as Prescribed: Yes Pain Present Now: No Electronic Signature(s) Signed: 03/18/2023 4:53:01 PM By: Shawn Stall RN, BSN Entered By: Shawn Stall on 03/18/2023 11:06:54 -------------------------------------------------------------------------------- Clinic Level of Care Assessment Details Patient Name: Date of Service: [name] 03/18/2023 11:00 [name] Medical Record Number: [id number] Patient Account Number: [id number] Date of Birth/Sex: Treating RN: 1955/11/29 (66 y.o. [name] Primary Care Alecia Doi: Renford Dills Other Clinician: Referring Imara Standiford: Treating Reise Gladney/Extender: Dione Plover in Treatment:  13 Clinic Level of Care Assessment Items TOOL 4 Quantity Score X- 1 0 Use when only an EandM is performed on FOLLOW-UP visit ASSESSMENTS - Nursing Assessment / Reassessment X- 1 10 Reassessment of Co-morbidities (includes updates in patient status) X- 1 5 Reassessment of Adherence to Treatment Plan ASSESSMENTS - Wound and Skin [name] ssessment / Reassessment []  - 0 Simple Wound Assessment / Reassessment - one wound X- 2 5 Complex Wound Assessment / Reassessment - multiple wounds X- 1 10 Dermatologic / Skin Assessment (not related to wound area) ASSESSMENTS - Focused Assessment X- 2 5 Circumferential Edema Measurements - multi extremities []  - 0 Nutritional Assessment / Counseling / Intervention [name] ([id number]) [id number] Page 2 of 11 []  - 0 Lower Extremity Assessment (monofilament, tuning fork, pulses) []  - 0 Peripheral Arterial Disease Assessment (using hand held doppler) ASSESSMENTS - Ostomy and/or Continence Assessment and Care []  - 0 Incontinence Assessment and Management []  - 0 Ostomy Care Assessment and Management (repouching, etc.) PROCESS - Coordination of Care []  - 0 Simple Patient / Family Education for ongoing care X- 1 20 Complex (extensive) Patient / Family Education for ongoing care X- 1 10 Staff obtains Chiropractor, Records, T Results / Process Orders est X- 1 10 Staff telephones HHA, Nursing Homes / Clarify orders / etc []  - 0 Routine Transfer to another Facility (non-emergent condition) []  - 0 Routine Hospital Admission (non-emergent condition) []  - 0 New Admissions / Manufacturing engineer / Ordering NPWT Apligraf, etc. , []  - 0 Emergency Hospital Admission (emergent condition) []  - 0 Simple Discharge Coordination X- 1 15 Complex (extensive) Discharge Coordination PROCESS - Special Needs []  - 0 Pediatric / Minor Patient Management []  - 0 Isolation Patient Management []  - 0 Hearing / Language / Visual special  needs []  - 0 Assessment of Community assistance (transportation, D/C planning, etc.) []  - 0 Additional assistance / Altered mentation []  - 0 Support Surface(s) Assessment (bed, cushion, seat, etc.) INTERVENTIONS - Wound Cleansing /  Measurement []  - 0 Simple Wound Cleansing - one wound X- 2 5 Complex Wound Cleansing - multiple wounds X- 1 5 Wound Imaging (photographs - any number of wounds) []  - 0 Wound Tracing (instead of photographs) []  - 0 Simple Wound Measurement - one wound X- 2 5 Complex Wound Measurement - multiple wounds INTERVENTIONS - Wound Dressings []  - 0 Small Wound Dressing one or multiple wounds []  - 0 Medium Wound Dressing one or multiple wounds X- 2 20 Large Wound Dressing one or multiple wounds []  - 0 Application of Medications - topical []  - 0 Application of Medications - injection INTERVENTIONS - Miscellaneous []  - 0 External ear exam []  - 0 Specimen Collection (cultures, biopsies, blood, body fluids, etc.) []  - 0 Specimen(s) / Culture(s) sent or taken to Lab for analysis []  - 0 Patient Transfer (multiple staff / Nurse, adult / Similar devices) []  - 0 Simple Staple / Suture removal (25 or less) []  - 0 Complex Staple / Suture removal (26 or more) []  - 0 Hypo / Hyperglycemic Management (close monitor of Blood Glucose) [name] ([id number]) [id number] Page 3 of 11 []  - 0 Ankle / Brachial Index (ABI) - do not check if billed separately X- 1 5 Vital Signs Has the patient been seen at the hospital within the last three years: Yes Total Score: 170 Level Of Care: New/Established - Level 5 Electronic Signature(s) Signed: 03/18/2023 4:53:01 PM By: Shawn Stall RN, BSN Entered By: Shawn Stall on 03/18/2023 11:44:27 -------------------------------------------------------------------------------- Complex / Palliative Patient Assessment Details Patient Name: Date of Service: [name] 03/18/2023 11:00 [name] Medical  Record Number: [id number] Patient Account Number: [id number] Date of Birth/Sex: Treating RN: 05-05-1956 (67 y.o. [name] Primary Care Varun Jourdan: Renford Dills Other Clinician: Referring Riddick Nuon: Treating Shunda Rabadi/Extender: Dione Plover in Treatment: 13 Complex Wound Management Criteria Patient has remarkable or complex co-morbidities requiring medications or treatments that extend wound healing times. Examples: Diabetes mellitus with chronic renal failure or end stage renal disease requiring dialysis Advanced or poorly controlled rheumatoid arthritis Diabetes mellitus and end stage chronic obstructive pulmonary disease Active cancer with current chemo- or radiation therapy COPD, CAD,HTN,CHF,SA,PVD,DM type II, degenerative right hip- unable to bend or stand. Palliative Wound Management Criteria Care Approach Wound Care Plan: Complex Wound Management Electronic Signature(s) Signed: 03/28/2023 3:32:31 PM By: Shawn Stall RN, BSN Signed: 04/16/2023 11:09:54 AM By: Geralyn Corwin DO Entered By: Shawn Stall on 03/26/2023 16:41:29 -------------------------------------------------------------------------------- Encounter Discharge Information Details Patient Name: Date of Service: [name] 03/18/2023 11:00 [name] Medical Record Number: [id number] Patient Account Number: [id number] Date of Birth/Sex: Treating RN: 11/11/1955 (67 y.o. [name] Primary Care Indie Nickerson: Renford Dills Other Clinician: Referring Murphy Duzan: Treating Solyana Nonaka/Extender: Dione Plover in Treatment: 13 Encounter Discharge Information Items Discharge Condition: Stable Ambulatory Status: Wheelchair Discharge Destination: Home Transportation: Private Auto Accompanied By: self Schedule Follow-up Appointment: Yes Clinical Summary of Care: Electronic Signature(s) Alberty, Ryland ([id number]) [id number] Page 4 of 11 Signed:  03/18/2023 4:53:01 PM By: Shawn Stall RN, BSN Entered By: Shawn Stall on 03/18/2023 11:44:54 -------------------------------------------------------------------------------- Lower Extremity Assessment Details Patient Name: Date of Service: [name] 03/18/2023 11:00 [name] Medical Record Number: [id number] Patient Account Number: [id number] Date of Birth/Sex: Treating RN: 27-May-1956 (67 y.o. [name] Primary Care Flordia Kassem: Renford Dills Other Clinician: Referring Chevis Weisensel: Treating Jerrian Mells/Extender: Dione Plover in Treatment: 13 Edema Assessment Assessed: Kyra Searles: No] Franne Forts: No] Edema: [Left: Yes] [Right: Yes] Calf  Left: Right: Point of Measurement: From Medial Instep 36 cm 39 cm Ankle Left: Right: Point of Measurement: From Medial Instep 25 cm 26 cm Vascular Assessment Pulses: Dorsalis Pedis Palpable: [Left:Yes] [Right:Yes] Electronic Signature(s) Signed: 03/18/2023 4:53:01 PM By: Shawn Stall RN, BSN Entered By: Shawn Stall on 03/18/2023 11:12:35 -------------------------------------------------------------------------------- Multi Wound Chart Details Patient Name: Date of Service: [name] 03/18/2023 11:00 [name] Medical Record Number: [id number] Patient Account Number: [id number] Date of Birth/Sex: Treating RN: 1956/02/07 (67 y.o. [name]) Primary Care Tieara Flitton: Renford Dills Other Clinician: Referring Zuleyka Kloc: Treating Ashland Osmer/Extender: Dione Plover in Treatment: 13 Vital Signs Height(in): Pulse(bpm): 64 Weight(lbs): 245.6 Blood Pressure(mmHg): 115/70 Body Mass Index(BMI): Temperature(F): 99 Respiratory Rate(breaths/min): 18 [30:Photos:] [[name]:[name] [id number] Page 5 of 11] Left, Posterior Lower Leg Right, Medial Lower Leg [name] Wound Location: Gradually Appeared Gradually Appeared [name] Wounding Event: Venous Leg Ulcer Venous Leg Ulcer [name] Primary Etiology: Anemia, Chronic  Obstructive Anemia, Chronic Obstructive [name] Comorbid History: Pulmonary Disease (COPD), Sleep Pulmonary Disease (COPD), Sleep Apnea, Congestive Heart Failure, Apnea, Congestive Heart Failure, Coronary Artery Disease, Coronary Artery Disease, Hypertension, Peripheral Venous Hypertension, Peripheral Venous Disease, Type II Diabetes, Disease, Type II Diabetes, Osteoarthritis, Neuropathy Osteoarthritis, Neuropathy 09/23/2020 10/31/2022 [name] Date Acquired: 98 13 [name] Weeks of Treatment: Open Open [name] Wound Status: No No [name] Wound Recurrence: 18x17.5x0.3 4.6x2x0.2 [name] Measurements L x W x D (cm) 247.4 7.226 [name] [name] (cm) : rea 74.22 1.445 [name] Volume (cm) : 41.90% -53.40% [name] % Reduction in Area: 12.90% -53.40% [name] % Reduction in Volume: Full Thickness Without Exposed Full Thickness Without Exposed [name] Classification: Support Structures Support Structures Large Medium [name] Exudate Amount: Serosanguineous Serosanguineous [name] Exudate Type: red, brown red, brown [name] Exudate Color: Distinct, outline attached Distinct, outline attached [name] Wound Margin: Small (1-33%) Medium (34-66%) [name] Granulation Amount: Red Red [name] Granulation Quality: Large (67-100%) Medium (34-66%) [name] Necrotic Amount: Fat Layer (Subcutaneous Tissue): Yes Fat Layer (Subcutaneous Tissue): Yes [name] Exposed Structures: Fascia: No Tendon: No Muscle: No Joint: No Bone: No Small (1-33%) Small (1-33%) [name] Epithelialization: Excoriation: No Excoriation: No [name] Periwound Skin Texture: Induration: No Induration: No Callus: No Callus: No Crepitus: No Crepitus: No Rash: No Rash: No Scarring: No Scarring: No Maceration: No Dry/Scaly: Yes [name] Periwound Skin Moisture: Dry/Scaly: No Maceration: No Atrophie Blanche: No Hemosiderin Staining: Yes [name] Periwound Skin Color: Cyanosis: No Atrophie Blanche: No Ecchymosis: No Cyanosis: No Erythema: No Ecchymosis: No Hemosiderin Staining: No Erythema:  No Mottled: No Mottled: No Pallor: No Pallor: No Rubor: No Rubor: No No Abnormality [name] [name] Temperature: Treatment Notes Wound #30 (Lower Leg) Wound Laterality: Left, Posterior Cleanser Soap and Water Discharge Instruction: May shower and wash wound with dial antibacterial soap and water prior to dressing change. Peri-Wound Care Topical topical compounding antibiotics from [facility] Discharge Instruction: once arrives discontinue dakins. Spray then apply calcium alginate Ag. Primary Dressing Dakin's Solution 0.25%, 16 (oz) Discharge Instruction: Moisten gauze with Dakin's solution Maxorb Extra Ag+ Alginate Dressing, 4x4.75 (in/in) Discharge Instruction: Apply over the antibiotic spray ****once the topical compounding antibiotics arrive. Secondary Dressing ABD Pad, 8x10 Discharge Instruction: Apply over primary dressing as directed. [name] ([id number]) [id number] Page 6 of 11 Secured With [employer], 4.5x3.1 (in/yd) Discharge Instruction: Secure with Kerlix as directed. Compression Wrap tubigrip size E Discharge Instruction: apply daily one layer size E. Compression Stockings Add-Ons Wound #31 (Lower Leg) Wound Laterality: Right, Medial Cleanser Soap and Water Discharge Instruction: May shower and wash wound with dial antibacterial soap and  water prior to dressing change. Peri-Wound Care Topical topical compounding [facility] from [facility] Discharge Instruction: once arrives discontinue dakins. Spray then apply calcium alginate Ag. Primary Dressing Dakin's Solution 0.25%, 16 (oz) Discharge Instruction: Moisten gauze with Dakin's solution Maxorb Extra Ag+ Alginate Dressing, 4x4.75 (in/in) Discharge Instruction: Apply over the antibiotic spray ****once the topical compounding antibiotics arrive. Secondary Dressing ABD Pad, 8x10 Discharge Instruction: Apply over primary dressing as directed. Secured With [employer], 4.5x3.1 (in/yd) Discharge Instruction: Secure with Kerlix as directed. Compression Wrap tubigrip size E Discharge Instruction: apply daily one layer size E. Compression Stockings Add-Ons Electronic Signature(s) Signed: 03/18/2023 4:25:00 PM By: Geralyn Corwin DO Entered By: Geralyn Corwin on 03/18/2023 13:26:45 -------------------------------------------------------------------------------- Multi-Disciplinary Care Plan Details Patient Name: Date of Service: [name] 03/18/2023 11:00 [name] Medical Record Number: [id number] Patient Account Number: [id number] Date of Birth/Sex: Treating RN: Apr 16, 1956 (67 y.o. [name] Primary Care Sarahbeth Cashin: Renford Dills Other Clinician: Referring Santiel Topper: Treating Stephon Weathers/Extender: Dione Plover in Treatment: 13 Active Inactive Pain, Acute or Chronic Maxim, Dellis ([id number]) [id number] Page 7 of 11 Nursing Diagnoses: Pain, acute or chronic: actual or potential Potential alteration in comfort, pain Goals: Patient will verbalize adequate pain control and receive pain control interventions during procedures as needed Date Initiated: 12/17/2022 Target Resolution Date: 04/25/2023 Goal Status: Active Interventions: Encourage patient to take pain medications as prescribed Provide education on pain management Reposition patient for comfort Treatment Activities: Administer pain control measures as ordered : 12/17/2022 Notes: Wound/Skin Impairment Nursing Diagnoses: Knowledge deficit related to ulceration/compromised skin integrity Goals: Patient/caregiver will verbalize understanding of skin care regimen Date Initiated: 12/17/2022 Target Resolution Date: 04/25/2023 Goal Status: Active Interventions: Assess patient/caregiver ability to perform ulcer/skin care regimen upon admission and as needed Assess ulceration(s) every visit Provide education on ulcer and skin  care Treatment Activities: Skin care regimen initiated : 12/17/2022 Topical wound management initiated : 12/17/2022 Notes: Electronic Signature(s) Signed: 03/18/2023 4:53:01 PM By: Shawn Stall RN, BSN Entered By: Shawn Stall on 03/18/2023 11:25:40 -------------------------------------------------------------------------------- Pain Assessment Details Patient Name: Date of Service: [name] 03/18/2023 11:00 [name] Medical Record Number: [id number] Patient Account Number: [id number] Date of Birth/Sex: Treating RN: 07-Feb-1956 (67 y.o. [name] Primary Care Jermale Crass: Renford Dills Other Clinician: Referring Britny Riel: Treating Lester Crickenberger/Extender: Dione Plover in Treatment: 13 Active Problems Location of Pain Severity and Description of Pain Patient Has Paino No Site Locations [name] ([id number]) [id number] Page 8 of 11 Pain Management and Medication Current Pain Management: Electronic Signature(s) Signed: 03/18/2023 4:53:01 PM By: Shawn Stall RN, BSN Entered By: Shawn Stall on 03/18/2023 11:07:10 -------------------------------------------------------------------------------- Patient/Caregiver Education Details Patient Name: Date of Service: [name] 6/25/2024andnbsp11:00 [name] Medical Record Number: [id number] Patient Account Number: [id number] Date of Birth/Gender: Treating RN: 1956-09-23 (67 y.o. [name] Primary Care Physician: Renford Dills Other Clinician: Referring Physician: Treating Physician/Extender: Dione Plover in Treatment: 13 Education Assessment Education Provided To: Patient Education Topics Provided Wound/Skin Impairment: Handouts: Caring for Your Ulcer Methods: Explain/Verbal Responses: Reinforcements needed Electronic Signature(s) Signed: 03/18/2023 4:53:01 PM By: Shawn Stall RN, BSN Entered By: Shawn Stall on 03/18/2023  11:25:51 -------------------------------------------------------------------------------- Wound Assessment Details Patient Name: Date of Service: [name] 03/18/2023 11:00 [name] Medical Record Number: [id number] Patient Account Number: [id number] Date of Birth/Sex: Treating RN: [birth year] (67 y.o. [name] Primary Care Kirah Stice: Renford Dills Other Clinician: Mcdonagh, [name] ([id number]) [id number] Page 9 of 11 Referring Dent Plantz:  Treating Heily Carlucci/Extender: Dione Plover in Treatment: 13 Wound Status Wound Number: 30 Primary Venous Leg Ulcer Etiology: Wound Location: Left, Posterior Lower Leg Wound Open Wounding Event: Gradually Appeared Status: Date Acquired: 09/23/2020 Comorbid Anemia, Chronic Obstructive Pulmonary Disease (COPD), Sleep Weeks Of Treatment: 13 History: Apnea, Congestive Heart Failure, Coronary Artery Disease, Clustered Wound: No Hypertension, Peripheral Venous Disease, Type II Diabetes, Osteoarthritis, Neuropathy Photos Wound Measurements Length: (cm) 18 Width: (cm) 17.5 Depth: (cm) 0.3 Area: (cm) 247.4 Volume: (cm) 74.22 % Reduction in Area: 41.9% % Reduction in Volume: 12.9% Epithelialization: Small (1-33%) Tunneling: No Undermining: No Wound Description Classification: Full Thickness Without Exposed Suppor Wound Margin: Distinct, outline attached Exudate Amount: Large Exudate Type: Serosanguineous Exudate Color: red, brown t Structures Foul Odor After Cleansing: No Slough/Fibrino Yes Wound Bed Granulation Amount: Small (1-33%) Exposed Structure Granulation Quality: Red Fat Layer (Subcutaneous Tissue) Exposed: Yes Necrotic Amount: Large (67-100%) Necrotic Quality: Adherent Slough Periwound Skin Texture Texture Color No Abnormalities Noted: No No Abnormalities Noted: No Callus: No Atrophie Blanche: No Crepitus: No Cyanosis: No Excoriation: No Ecchymosis: No Induration:  No Erythema: No Rash: No Hemosiderin Staining: No Scarring: No Mottled: No Pallor: No Moisture Rubor: No No Abnormalities Noted: No Dry / Scaly: No Temperature / Pain Maceration: No Temperature: No Abnormality Electronic Signature(s) Signed: 03/18/2023 3:59:56 PM By: Tommie Ard RN Signed: 03/18/2023 4:53:01 PM By: Shawn Stall RN, BSN Entered By: Tommie Ard on 03/18/2023 11:18:53 [name] ([id number]) [id number] Page 10 of 11 -------------------------------------------------------------------------------- Wound Assessment Details Patient Name: Date of Service: [name] 03/18/2023 11:00 [name] Medical Record Number: [id number] Patient Account Number: [id number] Date of Birth/Sex: Treating RN: Sep 26, 1955 (67 y.o. [name] Primary Care Braxtin Bamba: Renford Dills Other Clinician: Referring Caleesi Kohl: Treating Kaleo Condrey/Extender: Dione Plover in Treatment: 13 Wound Status Wound Number: 31 Primary Venous Leg Ulcer Etiology: Wound Location: Right, Medial Lower Leg Wound Open Wounding Event: Gradually Appeared Status: Date Acquired: 10/31/2022 Comorbid Anemia, Chronic Obstructive Pulmonary Disease (COPD), Sleep Weeks Of Treatment: 13 History: Apnea, Congestive Heart Failure, Coronary Artery Disease, Clustered Wound: No Hypertension, Peripheral Venous Disease, Type II Diabetes, Osteoarthritis, Neuropathy Photos Wound Measurements Length: (cm) 4.6 Width: (cm) 2 Depth: (cm) 0.2 Area: (cm) 7.226 Volume: (cm) 1.445 % Reduction in Area: -53.4% % Reduction in Volume: -53.4% Epithelialization: Small (1-33%) Tunneling: No Undermining: No Wound Description Classification: Full Thickness Without Exposed Suppor Wound Margin: Distinct, outline attached Exudate Amount: Medium Exudate Type: Serosanguineous Exudate Color: red, brown t Structures Foul Odor After Cleansing: No Slough/Fibrino No Wound Bed Granulation  Amount: Medium (34-66%) Exposed Structure Granulation Quality: Red Fascia Exposed: No Necrotic Amount: Medium (34-66%) Fat Layer (Subcutaneous Tissue) Exposed: Yes Necrotic Quality: Adherent Slough Tendon Exposed: No Muscle Exposed: No Joint Exposed: No Bone Exposed: No Periwound Skin Texture Texture Color No Abnormalities Noted: No No Abnormalities Noted: No Callus: No Atrophie Blanche: No Crepitus: No Cyanosis: No Excoriation: No Ecchymosis: No Induration: No Erythema: No Rash: No Hemosiderin Staining: Yes Scarring: No Mottled: No Pallor: No Moisture Rubor: No No Abnormalities Noted: No Dry / Scaly: Yes Maceration: No Electronic Signature(s) Signed: 03/18/2023 3:59:56 PM By: Tommie Ard RN Signed: 03/18/2023 4:53:01 PM By: Shawn Stall RN, BSN [name] ([id number]) [id number] Page 11 of 11 Entered By: Tommie Ard on 03/18/2023 11:18:20 -------------------------------------------------------------------------------- Vitals Details Patient Name: Date of Service: [name] 03/18/2023 11:00 [name] Medical Record Number: [id number] Patient Account Number: [id number] Date of Birth/Sex: Treating RN: [birth year] (67 y.o. [name] Primary  Care Lateya Dauria: Renford Dills Other Clinician: Referring Gerda Yin: Treating Nicholai Willette/Extender: Dione Plover in Treatment: 13 Vital Signs Time Taken: 11:05 Temperature (F): 99 Weight (lbs): 245.6 Pulse (bpm): 64 Respiratory Rate (breaths/min): 18 Blood Pressure (mmHg): 115/70 Reference Range: 80 - 120 mg / dl Electronic Signature(s) Signed: 03/18/2023 4:53:01 PM By: Shawn Stall RN, BSN Entered By: Shawn Stall on 03/18/2023 11:07:06

## 2023-03-24 DIAGNOSIS — Z794 Long term (current) use of insulin: Secondary | ICD-10-CM | POA: Diagnosis not present

## 2023-03-24 DIAGNOSIS — B351 Tinea unguium: Secondary | ICD-10-CM | POA: Diagnosis not present

## 2023-03-24 DIAGNOSIS — M2042 Other hammer toe(s) (acquired), left foot: Secondary | ICD-10-CM | POA: Diagnosis not present

## 2023-03-24 DIAGNOSIS — E1140 Type 2 diabetes mellitus with diabetic neuropathy, unspecified: Secondary | ICD-10-CM | POA: Diagnosis not present

## 2023-03-25 DIAGNOSIS — I5042 Chronic combined systolic (congestive) and diastolic (congestive) heart failure: Secondary | ICD-10-CM | POA: Diagnosis not present

## 2023-03-25 DIAGNOSIS — E1165 Type 2 diabetes mellitus with hyperglycemia: Secondary | ICD-10-CM | POA: Diagnosis not present

## 2023-03-25 DIAGNOSIS — E1159 Type 2 diabetes mellitus with other circulatory complications: Secondary | ICD-10-CM | POA: Diagnosis not present

## 2023-03-25 DIAGNOSIS — J449 Chronic obstructive pulmonary disease, unspecified: Secondary | ICD-10-CM | POA: Diagnosis not present

## 2023-03-28 DIAGNOSIS — G894 Chronic pain syndrome: Secondary | ICD-10-CM | POA: Diagnosis not present

## 2023-04-15 ENCOUNTER — Encounter (HOSPITAL_BASED_OUTPATIENT_CLINIC_OR_DEPARTMENT_OTHER): Payer: Medicare HMO | Attending: Internal Medicine | Admitting: Internal Medicine

## 2023-04-15 DIAGNOSIS — L97912 Non-pressure chronic ulcer of unspecified part of right lower leg with fat layer exposed: Secondary | ICD-10-CM | POA: Diagnosis not present

## 2023-04-15 DIAGNOSIS — I87333 Chronic venous hypertension (idiopathic) with ulcer and inflammation of bilateral lower extremity: Secondary | ICD-10-CM | POA: Diagnosis not present

## 2023-04-15 DIAGNOSIS — L97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ed: Secondary | ICD-10-CM | POA: Insufficient documentation

## 2023-04-15 DIAGNOSIS — I5032 Chronic diastolic (congestive) heart failure: Secondary | ICD-10-CM | POA: Insufficient documentation

## 2023-04-15 DIAGNOSIS — I890 Lymphedema, not elsewhere classified: Secondary | ICD-10-CM | POA: Insufficient documentation

## 2023-04-15 DIAGNOSIS — E11622 Type 2 diabetes mellitus with other skin ulcer: Secondary | ICD-10-CM | POA: Diagnosis not present

## 2023-04-15 DIAGNOSIS — E440 Moderate protein-calorie malnutrition: Secondary | ICD-10-CM | POA: Insufficient documentation

## 2023-04-16 NOTE — Progress Notes (Signed)
[name], [name] ([id number]) [id number] Page 1 of 12 Visit Report for 04/15/2023 Arrival Information Details Patient Name: Date of Service: [name], [name] 04/15/2023 12:45 PM Medical Record Number: [id number] Patient Account Number: [id number] Date of Birth/Sex: Treating RN: [birth year] (67 y.o. M) Primary Care Zariah Jost: Renford Dills Other Clinician: Referring Kyleigh Nannini: Treating Magdelyn Roebuck/Extender: Dione Plover in Treatment: 17 Visit Information History Since Last Visit Added or deleted any medications: No Patient Arrived: Wheel Chair Any new allergies or adverse reactions: No Arrival Time: 13:06 Had a fall or experienced change in No Accompanied By: caregiver activities of daily living that may affect Transfer Assistance: None risk of falls: Patient Identification Verified: Yes Signs or symptoms of abuse/neglect since last visito No Secondary Verification Process Completed: Yes Hospitalized since last visit: No Patient Requires Transmission-Based Precautions: No Implantable device outside of the clinic excluding No Patient Has Alerts: No cellular tissue based products placed in the center since last visit: Has Dressing in Place as Prescribed: Yes Has Compression in Place as Prescribed: No Pain Present Now: Yes Electronic Signature(s) Signed: 04/16/2023 11:54:52 AM By: Thayer Dallas Entered By: Thayer Dallas on 04/15/2023 13:07:36 -------------------------------------------------------------------------------- Clinic Level of Care Assessment Details Patient Name: Date of Service: [name], [name] 04/15/2023 12:45 PM Medical Record Number: [id number] Patient Account Number: [id number] Date of Birth/Sex: Treating RN: 02-20-1956 (67 y.o. Harlon Flor, Millard.Loa Primary Care Karee Forge: Renford Dills Other Clinician: Referring Lavern Crimi: Treating Timber Lucarelli/Extender: Dione Plover in Treatment: 17 Clinic Level of Care  Assessment Items TOOL 4 Quantity Score X- 1 0 Use when only an EandM is performed on FOLLOW-UP visit ASSESSMENTS - Nursing Assessment / Reassessment X- 1 10 Reassessment of Co-morbidities (includes updates in patient status) X- 1 5 Reassessment of Adherence to Treatment Plan ASSESSMENTS - Wound and Skin A ssessment / Reassessment []  - 0 Simple Wound Assessment / Reassessment - one wound X- 2 5 Complex Wound Assessment / Reassessment - multiple wounds []  - 0 Dermatologic / Skin Assessment (not related to wound area) ASSESSMENTS - Focused Assessment X- 2 5 Circumferential Edema Measurements - multi extremities []  - 0 Nutritional Assessment / Counseling / Intervention [name], [name] ([id number]) [id number] Page 2 of 12 []  - 0 Lower Extremity Assessment (monofilament, tuning fork, pulses) []  - 0 Peripheral Arterial Disease Assessment (using hand held doppler) ASSESSMENTS - Ostomy and/or Continence Assessment and Care []  - 0 Incontinence Assessment and Management []  - 0 Ostomy Care Assessment and Management (repouching, etc.) PROCESS - Coordination of Care []  - 0 Simple Patient / Family Education for ongoing care X- 1 20 Complex (extensive) Patient / Family Education for ongoing care X- 1 10 Staff obtains Chiropractor, Records, T Results / Process Orders est X- 1 10 Staff telephones HHA, Nursing Homes / Clarify orders / etc []  - 0 Routine Transfer to another Facility (non-emergent condition) []  - 0 Routine Hospital Admission (non-emergent condition) []  - 0 New Admissions / Manufacturing engineer / Ordering NPWT Apligraf, etc. , []  - 0 Emergency Hospital Admission (emergent condition) []  - 0 Simple Discharge Coordination X- 1 15 Complex (extensive) Discharge Coordination PROCESS - Special Needs []  - 0 Pediatric / Minor Patient Management []  - 0 Isolation Patient Management []  - 0 Hearing / Language / Visual special needs []  - 0 Assessment  of Community assistance (transportation, D/C planning, etc.) []  - 0 Additional assistance / Altered mentation []  - 0 Support Surface(s) Assessment (bed, cushion, seat, etc.) INTERVENTIONS - Wound Cleansing / Measurement []  - 0 Simple Wound  Cleansing - one wound X- 2 5 Complex Wound Cleansing - multiple wounds X- 1 5 Wound Imaging (photographs - any number of wounds) []  - 0 Wound Tracing (instead of photographs) []  - 0 Simple Wound Measurement - one wound X- 2 5 Complex Wound Measurement - multiple wounds INTERVENTIONS - Wound Dressings []  - 0 Small Wound Dressing one or multiple wounds []  - 0 Medium Wound Dressing one or multiple wounds X- 2 20 Large Wound Dressing one or multiple wounds []  - 0 Application of Medications - topical []  - 0 Application of Medications - injection INTERVENTIONS - Miscellaneous []  - 0 External ear exam []  - 0 Specimen Collection (cultures, biopsies, blood, body fluids, etc.) []  - 0 Specimen(s) / Culture(s) sent or taken to Lab for analysis []  - 0 Patient Transfer (multiple staff / Nurse, adult / Similar devices) []  - 0 Simple Staple / Suture removal (25 or less) []  - 0 Complex Staple / Suture removal (26 or more) []  - 0 Hypo / Hyperglycemic Management (close monitor of Blood Glucose) [name], [name] ([id number]) [id number] Page 3 of 12 []  - 0 Ankle / Brachial Index (ABI) - do not check if billed separately X- 1 5 Vital Signs Has the patient been seen at the hospital within the last three years: Yes Total Score: 160 Level Of Care: New/Established - Level 5 Electronic Signature(s) Signed: 04/15/2023 4:56:09 PM By: Shawn Stall RN, BSN Entered By: Shawn Stall on 04/15/2023 13:41:55 -------------------------------------------------------------------------------- Encounter Discharge Information Details Patient Name: Date of Service: [name], [name] 04/15/2023 12:45 PM Medical Record Number: [id number] Patient  Account Number: [id number] Date of Birth/Sex: Treating RN: Aug 17, 1956 (67 y.o. [name] Primary Care Undrea Shipes: Renford Dills Other Clinician: Referring Aul Mangieri: Treating Saudia Smyser/Extender: Dione Plover in Treatment: 17 Encounter Discharge Information Items Discharge Condition: Stable Ambulatory Status: Wheelchair Discharge Destination: Skilled Nursing Facility Telephoned: No Orders Sent: Yes Transportation: Private Auto Accompanied By: caregiver Schedule Follow-up Appointment: Yes Clinical Summary of Care: Electronic Signature(s) Signed: 04/15/2023 4:56:09 PM By: Shawn Stall RN, BSN Entered By: Shawn Stall on 04/15/2023 13:42:53 -------------------------------------------------------------------------------- Lower Extremity Assessment Details Patient Name: Date of Service: [name], [name] 04/15/2023 12:45 PM Medical Record Number: [id number] Patient Account Number: [id number] Date of Birth/Sex: Treating RN: 11-18-1955 (67 y.o. M) Primary Care Bhavika Schnider: Renford Dills Other Clinician: Referring Vontae Court: Treating Jerrie Schussler/Extender: Dione Plover in Treatment: 17 Edema Assessment Assessed: Kyra Searles: No] Franne Forts: No] Edema: [Left: Yes] [Right: Yes] Calf Left: Right: Point of Measurement: From Medial Instep 34 cm 39.5 cm Ankle Left: Right: Point of Measurement: From Medial Instep 24 cm 25.5 cm Chamorro, Terel ([id number]) [id number] Page 4 of 12 Electronic Signature(s) Signed: 04/16/2023 11:54:52 AM By: Thayer Dallas Entered By: Thayer Dallas on 04/15/2023 13:08:47 -------------------------------------------------------------------------------- Multi Wound Chart Details Patient Name: Date of Service: [name], [name] 04/15/2023 12:45 PM Medical Record Number: [id number] Patient Account Number: [id number] Date of Birth/Sex: Treating RN: 1955-10-09 (67 y.o. M) Primary Care Jaevion Goto: Renford Dills  Other Clinician: Referring Arantza Darrington: Treating Kyrell Ruacho/Extender: Dione Plover in Treatment: 17 Vital Signs Height(in): Pulse(bpm): 69 Weight(lbs): 245.6 Blood Pressure(mmHg): 134/75 Body Mass Index(BMI): Temperature(F): 99.2 Respiratory Rate(breaths/min): 18 [30:Photos: No Photos] Left, Posterior Lower Leg Left, Posterior Lower Leg Right, Medial Lower Leg Wound Location: Gradually Appeared Gradually Appeared Gradually Appeared Wounding Event: Venous Leg Ulcer Venous Leg Ulcer Venous Leg Ulcer Primary Etiology: Anemia, Chronic Obstructive Anemia, Chronic Obstructive Anemia, Chronic Obstructive Comorbid History: Pulmonary Disease (COPD), Sleep Pulmonary Disease (COPD), Sleep  Pulmonary Disease (COPD), Sleep Apnea, Congestive Heart Failure, Apnea, Congestive Heart Failure, Apnea, Congestive Heart Failure, Coronary Artery Disease, Coronary Artery Disease, Coronary Artery Disease, Hypertension, Peripheral Venous Hypertension, Peripheral Venous Hypertension, Peripheral Venous Disease, Type II Diabetes, Disease, Type II Diabetes, Disease, Type II Diabetes, Osteoarthritis, Neuropathy Osteoarthritis, Neuropathy Osteoarthritis, Neuropathy 09/23/2020 09/23/2020 10/31/2022 Date Acquired: 17 17 17  Weeks of Treatment: Open Open Open Wound Status: No No No Wound Recurrence: 16x14.5x0.3 16x14.5x0.3 4.5x0.7x0.2 Measurements L x W x D (cm) 182.212 182.212 2.474 A (cm) : rea 54.664 54.664 0.495 Volume (cm) : 57.20% 57.20% 47.50% % Reduction in Area: 35.90% 35.90% 47.50% % Reduction in Volume: Full Thickness Without Exposed Full Thickness Without Exposed Full Thickness Without Exposed Classification: Support Structures Support Structures Support Structures Large Large Medium Exudate Amount: Serosanguineous Serosanguineous Serosanguineous Exudate Type: red, brown red, brown red, brown Exudate Color: Distinct, outline attached N/A Distinct, outline attached Wound  Margin: Large (67-100%) N/A Large (67-100%) Granulation Amount: Red N/A Red Granulation Quality: None Present (0%) N/A Small (1-33%) Necrotic Amount: Fat Layer (Subcutaneous Tissue): Yes N/A Fat Layer (Subcutaneous Tissue): Yes Exposed Structures: Fascia: No Tendon: No Muscle: No Joint: No Bone: No Small (1-33%) N/A Small (1-33%) Epithelialization: Excoriation: No Excoriation: No Periwound Skin Texture: Induration: No Induration: No Callus: No Callus: No Crepitus: No Crepitus: No Rash: No Rash: No Stitely, Bernard ([id number]) [id number] Page 5 of 12 Scarring: No Scarring: No Maceration: No Dry/Scaly: Yes Periwound Skin Moisture: Dry/Scaly: No Maceration: No Atrophie Blanche: No Hemosiderin Staining: Yes Periwound Skin Color: Cyanosis: No Atrophie Blanche: No Ecchymosis: No Cyanosis: No Erythema: No Ecchymosis: No Hemosiderin Staining: No Erythema: No Mottled: No Mottled: No Pallor: No Pallor: No Rubor: No Rubor: No No Abnormality N/A N/A Temperature: Treatment Notes Wound #30 (Lower Leg) Wound Laterality: Left, Posterior Cleanser Soap and Water Discharge Instruction: May shower and wash wound with dial antibacterial soap and water prior to dressing change. Peri-Wound Care Topical topical compounding antibiotics from [facility] Discharge Instruction: once arrives discontinue dakins. Spray then apply calcium alginate Ag. Primary Dressing Maxorb Extra Ag+ Alginate Dressing, 4x4.75 (in/in) Discharge Instruction: Apply over the antibiotic spray ****once the topical compounding antibiotics arrive. Secondary Dressing ABD Pad, 8x10 Discharge Instruction: Apply over primary dressing as directed. Secured With [employer], 4.5x3.1 (in/yd) Discharge Instruction: Secure with Kerlix as directed. Compression Wrap Kerlix Roll 4.5x3.1 (in/yd) Discharge Instruction: Apply Kerlix and Coban compression as directed. Coban  Self-Adherent Wrap 4x5 (in/yd) Discharge Instruction: Apply over Kerlix as directed. Compression Stockings Add-Ons Wound #31 (Lower Leg) Wound Laterality: Right, Medial Cleanser Soap and Water Discharge Instruction: May shower and wash wound with dial antibacterial soap and water prior to dressing change. Peri-Wound Care Topical topical compounding antibiotics from [facility] Discharge Instruction: once arrives discontinue dakins. Spray then apply calcium alginate Ag. Primary Dressing Maxorb Extra Ag+ Alginate Dressing, 4x4.75 (in/in) Discharge Instruction: Apply over the antibiotic spray ****once the topical compounding antibiotics arrive. Secondary Dressing ABD Pad, 8x10 Discharge Instruction: Apply over primary dressing as directed. Secured With [employer], 4.5x3.1 (in/yd) Discharge Instruction: Secure with Kerlix as directed. Compression Wrap Kerlix Roll 4.5x3.1 (in/yd) Discharge Instruction: Apply Kerlix and Coban compression as directed. Bratcher, Lorraine ([id number]) [id number] Page 6 of 12 Coban Self-Adherent Wrap 4x5 (in/yd) Discharge Instruction: Apply over Kerlix as directed. Compression Stockings Add-Ons Electronic Signature(s) Signed: 04/15/2023 4:26:53 PM By: Geralyn Corwin DO Entered By: Geralyn Corwin on 04/15/2023 14:25:35 -------------------------------------------------------------------------------- Multi-Disciplinary Care Plan Details Patient Name: Date of Service: [name], [name] 04/15/2023 12:45 PM Medical  Record Number: [id number] Patient Account Number: [id number] Date of Birth/Sex: Treating RN: 11/09/1955 (67 y.o. [name] Primary Care Janalynn Eder: Renford Dills Other Clinician: Referring Kethan Papadopoulos: Treating Chemere Steffler/Extender: Dione Plover in Treatment: 17 Active Inactive Pain, Acute or Chronic Nursing Diagnoses: Pain, acute or chronic: actual or potential Potential alteration  in comfort, pain Goals: Patient will verbalize adequate pain control and receive pain control interventions during procedures as needed Date Initiated: 12/17/2022 Target Resolution Date: 05/23/2023 Goal Status: Active Interventions: Encourage patient to take pain medications as prescribed Provide education on pain management Reposition patient for comfort Treatment Activities: Administer pain control measures as ordered : 12/17/2022 Notes: Wound/Skin Impairment Nursing Diagnoses: Knowledge deficit related to ulceration/compromised skin integrity Goals: Patient/caregiver will verbalize understanding of skin care regimen Date Initiated: 12/17/2022 Target Resolution Date: 05/23/2023 Goal Status: Active Interventions: Assess patient/caregiver ability to perform ulcer/skin care regimen upon admission and as needed Assess ulceration(s) every visit Provide education on ulcer and skin care Treatment Activities: Skin care regimen initiated : 12/17/2022 Topical wound management initiated : 12/17/2022 Notes: Electronic Signature(s) Tarte, Finnlee ([id number]) [id number] Page 7 of 12 Signed: 04/15/2023 4:56:09 PM By: Shawn Stall RN, BSN Entered By: Shawn Stall on 04/15/2023 13:18:09 -------------------------------------------------------------------------------- Pain Assessment Details Patient Name: Date of Service: [name], [name] 04/15/2023 12:45 PM Medical Record Number: [id number] Patient Account Number: [id number] Date of Birth/Sex: Treating RN: 02-Dec-1955 (66 y.o. M) Primary Care Ritisha Deitrick: Renford Dills Other Clinician: Referring Kristina Bertone: Treating Jennah Satchell/Extender: Dione Plover in Treatment: 17 Active Problems Location of Pain Severity and Description of Pain Patient Has Paino Yes Site Locations Pain Location: Pain in Ulcers Rate the pain. Current Pain Level: 7 Pain Management and Medication Current Pain  Management: Electronic Signature(s) Signed: 04/16/2023 11:54:52 AM By: Thayer Dallas Entered By: Thayer Dallas on 04/15/2023 13:08:05 -------------------------------------------------------------------------------- Patient/Caregiver Education Details Patient Name: Date of Service: Leafy Half [name], [name] 7/23/2024andnbsp12:45 PM Medical Record Number: [id number] Patient Account Number: [id number] Date of Birth/Gender: Treating RN: 11-08-1955 (67 y.o. [name] Primary Care Physician: Renford Dills Other Clinician: Referring Physician: Treating Physician/Extender: Dione Plover in Treatment: 17 Education Assessment Education Provided To: Patient Kludt, Rayne ([id number]) [id number] Page 8 of 12 Education Topics Provided Wound/Skin Impairment: Handouts: Caring for Your Ulcer Methods: Explain/Verbal Responses: Reinforcements needed Electronic Signature(s) Signed: 04/15/2023 4:56:09 PM By: Shawn Stall RN, BSN Entered By: Shawn Stall on 04/15/2023 13:18:24 -------------------------------------------------------------------------------- Wound Assessment Details Patient Name: Date of Service: [name], [name] 04/15/2023 12:45 PM Medical Record Number: [id number] Patient Account Number: [id number] Date of Birth/Sex: Treating RN: [birth year] (67 y.o. M) Primary Care Iain Sawchuk: Renford Dills Other Clinician: Referring Koen Antilla: Treating Lavella Myren/Extender: Dione Plover in Treatment: 17 Wound Status Wound Number: 30 Primary Venous Leg Ulcer Etiology: Wound Location: Left, Posterior Lower Leg Wound Open Wounding Event: Gradually Appeared Status: Date Acquired: 09/23/2020 Comorbid Anemia, Chronic Obstructive Pulmonary Disease (COPD), Sleep Weeks Of Treatment: 17 History: Apnea, Congestive Heart Failure, Coronary Artery Disease, Clustered Wound: No Hypertension, Peripheral Venous Disease, Type II  Diabetes, Osteoarthritis, Neuropathy Wound Measurements Length: (cm) 16 Width: (cm) 14.5 Depth: (cm) 0.3 Area: (cm) 182.212 Volume: (cm) 54.664 % Reduction in Area: 57.2% % Reduction in Volume: 35.9% Epithelialization: Small (1-33%) Tunneling: No Undermining: No Wound Description Classification: Full Thickness Without Exposed Suppor Wound Margin: Distinct, outline attached Exudate Amount: Large Exudate Type: Serosanguineous Exudate Color: red, brown t Structures Foul Odor After Cleansing: No Slough/Fibrino Yes Wound Bed Granulation Amount: Large (67-100%) Exposed Structure Granulation Quality:  Red Fat Layer (Subcutaneous Tissue) Exposed: Yes Necrotic Amount: None Present (0%) Periwound Skin Texture Texture Color No Abnormalities Noted: No No Abnormalities Noted: No Callus: No Atrophie Blanche: No Crepitus: No Cyanosis: No Excoriation: No Ecchymosis: No Induration: No Erythema: No Rash: No Hemosiderin Staining: No Scarring: No Mottled: No Pallor: No Moisture Rubor: No No Abnormalities Noted: No Dry / Scaly: No Temperature / Pain Maceration: No Temperature: No Abnormality Electronic Signature(s) Signed: 04/16/2023 11:54:52 AM By: Thayer Dallas Weightman, Curlee ([id number]) [id number] Page 9 of 12 Signed: 04/16/2023 11:54:52 AM By: Thayer Dallas Entered By: Thayer Dallas on 04/15/2023 13:12:13 -------------------------------------------------------------------------------- Wound Assessment Details Patient Name: Date of Service: [name], [name] 04/15/2023 12:45 PM Medical Record Number: [id number] Patient Account Number: [id number] Date of Birth/Sex: Treating RN: 05/25/1956 (67 y.o. M) Primary Care Averie Meiner: Renford Dills Other Clinician: Referring Deandrea Vanpelt: Treating Vaniyah Lansky/Extender: Dione Plover in Treatment: 17 Wound Status Wound Number: 30 Primary Venous Leg Ulcer Etiology: Wound Location: Left,  Posterior Lower Leg Wound Open Wounding Event: Gradually Appeared Status: Date Acquired: 09/23/2020 Comorbid Anemia, Chronic Obstructive Pulmonary Disease (COPD), Sleep Weeks Of Treatment: 17 History: Apnea, Congestive Heart Failure, Coronary Artery Disease, Clustered Wound: No Hypertension, Peripheral Venous Disease, Type II Diabetes, Osteoarthritis, Neuropathy Photos Wound Measurements Length: (cm) 16 Width: (cm) 14.5 Depth: (cm) 0.3 Area: (cm) 182.212 Volume: (cm) 54.664 % Reduction in Area: 57.2% % Reduction in Volume: 35.9% Wound Description Classification: Full Thickness Without Exposed Supp Exudate Amount: Large Exudate Type: Serosanguineous Exudate Color: red, brown ort Structures Periwound Skin Texture Texture Color No Abnormalities Noted: No No Abnormalities Noted: No Moisture No Abnormalities Noted: No Treatment Notes Wound #30 (Lower Leg) Wound Laterality: Left, Posterior Cleanser Soap and Water Discharge Instruction: May shower and wash wound with dial antibacterial soap and water prior to dressing change. Peri-Wound Care Topical topical compounding antibiotics from [facility], Gideon ([id number]) [phone].pdf Page 10 of 12 Discharge Instruction: once arrives discontinue dakins. Spray then apply calcium alginate Ag. Primary Dressing Maxorb Extra Ag+ Alginate Dressing, 4x4.75 (in/in) Discharge Instruction: Apply over the antibiotic spray ****once the topical compounding antibiotics arrive. Secondary Dressing ABD Pad, 8x10 Discharge Instruction: Apply over primary dressing as directed. Secured With [employer], 4.5x3.1 (in/yd) Discharge Instruction: Secure with Kerlix as directed. Compression Wrap Kerlix Roll 4.5x3.1 (in/yd) Discharge Instruction: Apply Kerlix and Coban compression as directed. Coban Self-Adherent Wrap 4x5 (in/yd) Discharge Instruction: Apply over Kerlix as directed. Compression  Stockings Add-Ons Electronic Signature(s) Signed: 04/16/2023 11:54:52 AM By: Thayer Dallas Entered By: Thayer Dallas on 04/15/2023 13:13:04 -------------------------------------------------------------------------------- Wound Assessment Details Patient Name: Date of Service: [name], [name] 04/15/2023 12:45 PM Medical Record Number: [id number] Patient Account Number: [id number] Date of Birth/Sex: Treating RN: [birth year] (67 y.o. M) Primary Care Jeena Arnett: Renford Dills Other Clinician: Referring Wadie Liew: Treating Aalyiah Camberos/Extender: Dione Plover in Treatment: 17 Wound Status Wound Number: 31 Primary Venous Leg Ulcer Etiology: Wound Location: Right, Medial Lower Leg Wound Open Wounding Event: Gradually Appeared Status: Date Acquired: 10/31/2022 Comorbid Anemia, Chronic Obstructive Pulmonary Disease (COPD), Sleep Weeks Of Treatment: 17 History: Apnea, Congestive Heart Failure, Coronary Artery Disease, Clustered Wound: No Hypertension, Peripheral Venous Disease, Type II Diabetes, Osteoarthritis, Neuropathy Photos Wound Measurements Length: (cm) 4.5 Width: (cm) 0.7 Depth: (cm) 0.2 Area: (cm) 2.474 Volume: (cm) 0.495 Whittington, Jovaun ([id number]) % Reduction in Area: 47.5% % Reduction in Volume: 47.5% Epithelialization: Small (1-33%) Tunneling: No Undermining: No [phone] Page 11 of 12 Wound Description Classification: Full Thickness Without Exposed Suppor Wound Margin: Distinct,  outline attached Exudate Amount: Medium Exudate Type: Serosanguineous Exudate Color: red, brown t Structures Foul Odor After Cleansing: No Slough/Fibrino No Wound Bed Granulation Amount: Large (67-100%) Exposed Structure Granulation Quality: Red Fascia Exposed: No Necrotic Amount: Small (1-33%) Fat Layer (Subcutaneous Tissue) Exposed: Yes Necrotic Quality: Adherent Slough Tendon Exposed: No Muscle Exposed: No Joint Exposed: No Bone Exposed:  No Periwound Skin Texture Texture Color No Abnormalities Noted: No No Abnormalities Noted: No Callus: No Atrophie Blanche: No Crepitus: No Cyanosis: No Excoriation: No Ecchymosis: No Induration: No Erythema: No Rash: No Hemosiderin Staining: Yes Scarring: No Mottled: No Pallor: No Moisture Rubor: No No Abnormalities Noted: No Dry / Scaly: Yes Maceration: No Treatment Notes Wound #31 (Lower Leg) Wound Laterality: Right, Medial Cleanser Soap and Water Discharge Instruction: May shower and wash wound with dial antibacterial soap and water prior to dressing change. Peri-Wound Care Topical topical compounding antibiotics from [facility] Discharge Instruction: once arrives discontinue dakins. Spray then apply calcium alginate Ag. Primary Dressing Maxorb Extra Ag+ Alginate Dressing, 4x4.75 (in/in) Discharge Instruction: Apply over the antibiotic spray ****once the topical compounding antibiotics arrive. Secondary Dressing ABD Pad, 8x10 Discharge Instruction: Apply over primary dressing as directed. Secured With [employer], 4.5x3.1 (in/yd) Discharge Instruction: Secure with Kerlix as directed. Compression Wrap Kerlix Roll 4.5x3.1 (in/yd) Discharge Instruction: Apply Kerlix and Coban compression as directed. Coban Self-Adherent Wrap 4x5 (in/yd) Discharge Instruction: Apply over Kerlix as directed. Compression Stockings Add-Ons Electronic Signature(s) Signed: 04/16/2023 11:54:52 AM By: Thayer Dallas Entered By: Thayer Dallas on 04/15/2023 13:11:50 Behnke, Melvin ([id number]) [id number] Page 12 of 12 -------------------------------------------------------------------------------- Vitals Details Patient Name: Date of Service: [name], [name] 04/15/2023 12:45 PM Medical Record Number: [id number] Patient Account Number: [id number] Date of Birth/Sex: Treating RN: [birth year] (67 y.o. M) Primary Care Draeden Kellman: Renford Dills Other  Clinician: Referring Goebel Hellums: Treating Mauria Asquith/Extender: Dione Plover in Treatment: 17 Vital Signs Time Taken: 13:07 Temperature (F): 99.2 Weight (lbs): 245.6 Pulse (bpm): 69 Respiratory Rate (breaths/min): 18 Blood Pressure (mmHg): 134/75 Reference Range: 80 - 120 mg / dl Electronic Signature(s) Signed: 04/16/2023 11:54:52 AM By: Thayer Dallas Entered By: Thayer Dallas on 04/15/2023 13:07:53

## 2023-04-18 DIAGNOSIS — L97929 Non-pressure chronic ulcer of unspecified part of left lower leg with unspecified severity: Secondary | ICD-10-CM | POA: Diagnosis not present

## 2023-04-18 DIAGNOSIS — L97919 Non-pressure chronic ulcer of unspecified part of right lower leg with unspecified severity: Secondary | ICD-10-CM | POA: Diagnosis not present

## 2023-04-21 DIAGNOSIS — I10 Essential (primary) hypertension: Secondary | ICD-10-CM | POA: Diagnosis not present

## 2023-04-21 DIAGNOSIS — E119 Type 2 diabetes mellitus without complications: Secondary | ICD-10-CM | POA: Diagnosis not present

## 2023-04-21 DIAGNOSIS — E118 Type 2 diabetes mellitus with unspecified complications: Secondary | ICD-10-CM | POA: Diagnosis not present

## 2023-04-22 DIAGNOSIS — D649 Anemia, unspecified: Secondary | ICD-10-CM | POA: Diagnosis not present

## 2023-04-23 DIAGNOSIS — E119 Type 2 diabetes mellitus without complications: Secondary | ICD-10-CM | POA: Diagnosis not present

## 2023-04-23 DIAGNOSIS — Z79899 Other long term (current) drug therapy: Secondary | ICD-10-CM | POA: Diagnosis not present

## 2023-04-24 DIAGNOSIS — R059 Cough, unspecified: Secondary | ICD-10-CM | POA: Diagnosis not present

## 2023-04-25 DIAGNOSIS — D510 Vitamin B12 deficiency anemia due to intrinsic factor deficiency: Secondary | ICD-10-CM | POA: Diagnosis not present

## 2023-04-25 DIAGNOSIS — D509 Iron deficiency anemia, unspecified: Secondary | ICD-10-CM | POA: Diagnosis not present

## 2023-04-30 DIAGNOSIS — E119 Type 2 diabetes mellitus without complications: Secondary | ICD-10-CM | POA: Diagnosis not present

## 2023-04-30 DIAGNOSIS — J449 Chronic obstructive pulmonary disease, unspecified: Secondary | ICD-10-CM | POA: Diagnosis not present

## 2023-04-30 DIAGNOSIS — I10 Essential (primary) hypertension: Secondary | ICD-10-CM | POA: Diagnosis not present

## 2023-04-30 DIAGNOSIS — I509 Heart failure, unspecified: Secondary | ICD-10-CM | POA: Diagnosis not present

## 2023-05-13 ENCOUNTER — Encounter (HOSPITAL_BASED_OUTPATIENT_CLINIC_OR_DEPARTMENT_OTHER): Payer: 59 | Attending: Internal Medicine | Admitting: Internal Medicine

## 2023-05-19 DIAGNOSIS — I5042 Chronic combined systolic (congestive) and diastolic (congestive) heart failure: Secondary | ICD-10-CM | POA: Diagnosis not present

## 2023-05-19 DIAGNOSIS — J349 Unspecified disorder of nose and nasal sinuses: Secondary | ICD-10-CM | POA: Diagnosis not present

## 2023-05-27 DIAGNOSIS — I7389 Other specified peripheral vascular diseases: Secondary | ICD-10-CM | POA: Diagnosis not present

## 2023-05-27 DIAGNOSIS — R293 Abnormal posture: Secondary | ICD-10-CM | POA: Diagnosis not present

## 2023-05-28 DIAGNOSIS — R293 Abnormal posture: Secondary | ICD-10-CM | POA: Diagnosis not present

## 2023-05-28 DIAGNOSIS — I7389 Other specified peripheral vascular diseases: Secondary | ICD-10-CM | POA: Diagnosis not present

## 2023-05-29 DIAGNOSIS — I7389 Other specified peripheral vascular diseases: Secondary | ICD-10-CM | POA: Diagnosis not present

## 2023-05-29 DIAGNOSIS — R293 Abnormal posture: Secondary | ICD-10-CM | POA: Diagnosis not present

## 2023-05-30 DIAGNOSIS — I5042 Chronic combined systolic (congestive) and diastolic (congestive) heart failure: Secondary | ICD-10-CM | POA: Diagnosis not present

## 2023-05-30 DIAGNOSIS — J449 Chronic obstructive pulmonary disease, unspecified: Secondary | ICD-10-CM | POA: Diagnosis not present

## 2023-05-30 DIAGNOSIS — R293 Abnormal posture: Secondary | ICD-10-CM | POA: Diagnosis not present

## 2023-05-30 DIAGNOSIS — I7389 Other specified peripheral vascular diseases: Secondary | ICD-10-CM | POA: Diagnosis not present

## 2023-05-30 DIAGNOSIS — G8929 Other chronic pain: Secondary | ICD-10-CM | POA: Diagnosis not present

## 2023-05-31 DIAGNOSIS — I7389 Other specified peripheral vascular diseases: Secondary | ICD-10-CM | POA: Diagnosis not present

## 2023-05-31 DIAGNOSIS — R293 Abnormal posture: Secondary | ICD-10-CM | POA: Diagnosis not present

## 2023-06-02 DIAGNOSIS — I7389 Other specified peripheral vascular diseases: Secondary | ICD-10-CM | POA: Diagnosis not present

## 2023-06-02 DIAGNOSIS — R293 Abnormal posture: Secondary | ICD-10-CM | POA: Diagnosis not present

## 2023-06-03 ENCOUNTER — Encounter (HOSPITAL_BASED_OUTPATIENT_CLINIC_OR_DEPARTMENT_OTHER): Payer: Medicare HMO | Attending: Internal Medicine | Admitting: Internal Medicine

## 2023-06-03 DIAGNOSIS — E11622 Type 2 diabetes mellitus with other skin ulcer: Secondary | ICD-10-CM | POA: Insufficient documentation

## 2023-06-03 DIAGNOSIS — E440 Moderate protein-calorie malnutrition: Secondary | ICD-10-CM | POA: Insufficient documentation

## 2023-06-03 DIAGNOSIS — F17210 Nicotine dependence, cigarettes, uncomplicated: Secondary | ICD-10-CM | POA: Insufficient documentation

## 2023-06-03 DIAGNOSIS — L97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ed: Secondary | ICD-10-CM | POA: Diagnosis not present

## 2023-06-03 DIAGNOSIS — R293 Abnormal posture: Secondary | ICD-10-CM | POA: Diagnosis not present

## 2023-06-03 DIAGNOSIS — I5032 Chronic diastolic (congestive) heart failure: Secondary | ICD-10-CM | POA: Insufficient documentation

## 2023-06-03 DIAGNOSIS — I7389 Other specified peripheral vascular diseases: Secondary | ICD-10-CM | POA: Diagnosis not present

## 2023-06-03 DIAGNOSIS — I87333 Chronic venous hypertension (idiopathic) with ulcer and inflammation of bilateral lower extremity: Secondary | ICD-10-CM | POA: Insufficient documentation

## 2023-06-03 DIAGNOSIS — I890 Lymphedema, not elsewhere classified: Secondary | ICD-10-CM | POA: Insufficient documentation

## 2023-06-03 DIAGNOSIS — L97912 Non-pressure chronic ulcer of unspecified part of right lower leg with fat layer exposed: Secondary | ICD-10-CM | POA: Diagnosis not present

## 2023-06-03 NOTE — Progress Notes (Signed)
[name], [name] ([id number]) [id number] Page 1 of 9 Visit Report for 06/03/2023 Arrival Information Details Patient Name: Date of Service: [name] 06/03/2023 12:30 PM Medical Record Number: [id number] Patient Account Number: [id number] Date of Birth/Sex: Treating RN: 26-May-1956 (67 y.o. [name] Primary Care [name]: [name] Other Clinician: Referring [name]: Treating [name]/Extender: [name] in Treatment: 24 Visit Information History Since Last Visit All ordered tests and consults were completed: Yes Patient Arrived: Wheel Chair Added or deleted any medications: No Arrival Time: 12:37 Any new allergies or adverse reactions: No Accompanied By: caregiver Had a fall or experienced change in No Transfer Assistance: None activities of daily living that may affect Patient Identification Verified: Yes risk of falls: Secondary Verification Process Completed: Yes Signs or symptoms of abuse/neglect since last visito No Patient Requires Transmission-Based Precautions: No Hospitalized since last visit: No Patient Has Alerts: No Implantable device outside of the clinic excluding No cellular tissue based products placed in the center since last visit: Has Dressing in Place as Prescribed: Yes Pain Present Now: Yes Electronic Signature(s) Signed: 06/03/2023 3:48:27 PM By: [name] Entered By: [name] on 06/03/2023 09:37:54 -------------------------------------------------------------------------------- Compression Therapy Details Patient Name: Date of Service: [name] 06/03/2023 12:30 PM Medical Record Number: [id number] Patient Account Number: [id number] Date of Birth/Sex: Treating RN: [birth year] (67 y.o. [name] Primary Care [name]: [name] Other Clinician: Referring [name]: Treating [name]/Extender: [name] in Treatment: 24 Compression  Therapy Performed for Wound Assessment: Wound #30 Left,Posterior Lower Leg Performed By: [name] Parody, RN Compression Type: Three Layer Post Procedure Diagnosis Same as Pre-procedure Electronic Signature(s) Signed: 06/03/2023 3:48:27 PM By: [name] Entered By: [name] on 06/03/2023 10:36:04 Klunk, Navraj ([id number]) [id number] Page 2 of 9 -------------------------------------------------------------------------------- Compression Therapy Details Patient Name: Date of Service: [name] 06/03/2023 12:30 PM Medical Record Number: [id number] Patient Account Number: [id number] Date of Birth/Sex: Treating RN: [birth year] (67 y.o. [name] Primary Care [name]: [name] Other Clinician: Referring Alixandria Friedt: Treating [name]/Extender: [name] in Treatment: 24 Compression Therapy Performed for Wound Assessment: Wound #31 Right,Medial Lower Leg Performed By: [name] Parody, RN Compression Type: Three Layer Post Procedure Diagnosis Same as Pre-procedure Electronic Signature(s) Signed: 06/03/2023 3:48:27 PM By: [name] Entered By: [name] on 06/03/2023 10:36:05 -------------------------------------------------------------------------------- Encounter Discharge Information Details Patient Name: Date of Service: [name] 06/03/2023 12:30 PM Medical Record Number: [id number] Patient Account Number: [id number] Date of Birth/Sex: Treating RN: [birth year] (67 y.o. [name] Primary Care Easter Kennebrew: [name] Other Clinician: Referring [name]: Treating [name]/Extender: [name] in Treatment: 24 Encounter Discharge Information Items Post Procedure Vitals Discharge Condition: Stable Temperature (F): 98.9 Ambulatory Status: Wheelchair Pulse (bpm): 74 Discharge Destination: Skilled Nursing Facility Respiratory Rate  (breaths/min): 18 Telephoned: No Blood Pressure (mmHg): 135/72 Orders Sent: Yes Transportation: Private Auto Accompanied By: caregiver Schedule Follow-up Appointment: Yes Clinical Summary of Care: Patient Declined Electronic Signature(s) Signed: 06/03/2023 3:48:27 PM By: [name] Entered By: [name] on 06/03/2023 10:37:54 -------------------------------------------------------------------------------- Lower Extremity Assessment Details Patient Name: Date of Service: [name] 06/03/2023 12:30 PM Medical Record Number: [id number] Patient Account Number: [id number] Date of Birth/Sex: Treating RN: Sep 03, 1956 (67 y.o. [name] Primary Care [name]: [name] Other Clinician: Referring [name]: Treating Camdan Burdi/Extender: [name] in Treatment: 24 Edema Assessment Assessed: [name]: No] [name]: No] Edema: [Left: Yes] [Right: Yes] H[Left: [name] ([id number])] [  Right: [id number] Page 3 of 9] Calf Left: Right: Point of Measurement: From Medial Instep 34 cm 39.5 cm Ankle Left: Right: Point of Measurement: From Medial Instep 24 cm 25.5 cm Vascular Assessment Pulses: Dorsalis Pedis Palpable: [Left:Yes] [Right:Yes] Extremity colors, hair growth, and conditions: Extremity Color: [Left:Normal] [Right:Normal] Hair Growth on Extremity: [Left:No] [Right:No] Temperature of Extremity: [Left:Cool] [Right:Cool] Capillary Refill: [Left:< 3 seconds] [Right:< 3 seconds] Dependent Rubor: [Left:No] [Right:No] Blanched when Elevated: [Left:No No] [Right:No No] Toe Nail Assessment Left: Right: Thick: Yes Yes Discolored: Yes Yes Deformed: Yes Yes Improper Length and Hygiene: Yes Yes Electronic Signature(s) Signed: 06/03/2023 3:48:27 PM By: [name] Entered By: [name] on 06/03/2023 09:56:57 -------------------------------------------------------------------------------- Multi Wound Chart  Details Patient Name: Date of Service: [name] 06/03/2023 12:30 PM Medical Record Number: [id number] Patient Account Number: [id number] Date of Birth/Sex: Treating RN: Mar 27, 1956 (67 y.o. M) Primary Care Franko Hilliker: [name] Other Clinician: Referring [name] Schwan: Treating Cuauhtemoc Huegel/Extender: [name] in Treatment: 24 Vital Signs Height(in): Pulse(bpm): 74 Weight(lbs): 245.6 Blood Pressure(mmHg): 122/69 Body Mass Index(BMI): Temperature(F): 98.9 Respiratory Rate(breaths/min): 18 [30:Photos: No Photos Left, Posterior Lower Leg Wound Location: Gradually Appeared Wounding Event: Venous Leg Ulcer Primary Etiology: Anemia, Chronic Obstructive Comorbid History: Pulmonary Disease (COPD), Sleep Apnea, Congestive Heart Failure, Coronary Artery  Disease, Hypertension, Peripheral Venous Disease, Type II Diabetes, Osteoarthritis, Neuropathy 09/23/2020 Date Acquired: 24 Weeks of Treatment: Open Wound Status: No Wound Recurrence:] [31:No Photos Right, Medial Lower Leg Gradually Appeared Venous Leg  Ulcer Anemia, Chronic Obstructive Pulmonary Disease (COPD), Sleep Apnea, Congestive Heart Failure, Coronary Artery Disease, Hypertension, Peripheral Venous Disease, Type II Diabetes, Osteoarthritis, Neuropathy 10/31/2022 24 Open No] [N/A:N/A N/A N/A N/A  N/A N/A N/A N/A N/A] [name], [name] ([id number]) [30:14.3x12.7x0.3 Measurements L x W x D (cm) 142.636 A (cm) : rea 42.791 Volume (cm) : 66.50% % Reduction in Area: 49.80% % Reduction in Volume: Full Thickness Without Exposed Classification: Support Structures Large Exudate A mount: Serosanguineous  Exudate Type: red, brown Exudate Color: N/A Wound Margin: N/A Granulation A mount: N/A Granulation Quality: N/A Necrotic A mount: N/A Epithelialization: Periwound Skin Texture: Periwound Skin Moisture: Periwound Skin Color:] [31:3.2x3.7x0.2 9.299 1.86  -97.30% -97.50% Full Thickness Without Exposed Support Structures Medium  Serosanguineous red, brown Distinct, outline attached Large (67-100%) Red Small (1-33%) Small (1-33%) Excoriation: No Induration: No Callus: No Crepitus: No Rash: No Scarring: No  Dry/Scaly: Yes Maceration: No Hemosiderin Staining: Yes Atrophie Blanche: No Cyanosis: No Ecchymosis: No Erythema: No Mottled: No Pallor: No Rubor: No] [N/A:[id number] Page 4 of 9 N/A N/A N/A N/A N/A N/A N/A N/A N/A N/A N/A N/A  N/A N/A N/A N/A N/A] Treatment Notes Electronic Signature(s) Signed: 06/03/2023 1:53:07 PM By: Geralyn Corwin DO Entered By: Geralyn Corwin on 06/03/2023 10:28:21 -------------------------------------------------------------------------------- Multi-Disciplinary Care Plan Details Patient Name: Date of Service: [name] 06/03/2023 12:30 PM Medical Record Number: [id number] Patient Account Number: [id number] Date of Birth/Sex: Treating RN: Oct 04, 1955 (67 y.o. [name] Primary Care Faryal Marxen: [name] Other Clinician: Referring Cartha Rotert: Treating Metzli Pollick/Extender: [name] in Treatment: 24 Active Inactive Pain, Acute or Chronic Nursing Diagnoses: Pain, acute or chronic: actual or potential Potential alteration in comfort, pain Goals: Patient will verbalize adequate pain control and receive pain control interventions during procedures as needed Date Initiated: 12/17/2022 Target Resolution Date: 05/23/2023 Goal Status: Active Interventions: Encourage patient to take pain medications as prescribed Provide education on pain management Reposition patient for comfort Treatment Activities: Administer pain control measures as ordered :  12/17/2022 Notes: [name], [name] Page 5 of 9 Wound/Skin Impairment Nursing Diagnoses: Knowledge deficit related to ulceration/compromised skin integrity Goals: Patient/caregiver will verbalize understanding of skin care regimen Date  Initiated: 12/17/2022 Target Resolution Date: 05/23/2023 Goal Status: Active Interventions: Assess patient/caregiver ability to perform ulcer/skin care regimen upon admission and as needed Assess ulceration(s) every visit Provide education on ulcer and skin care Treatment Activities: Skin care regimen initiated : 12/17/2022 Topical wound management initiated : 12/17/2022 Notes: Electronic Signature(s) Signed: 06/03/2023 3:48:27 PM By: [name] Entered By: [name] on 06/03/2023 09:23:02 -------------------------------------------------------------------------------- Pain Assessment Details Patient Name: Date of Service: [name] 06/03/2023 12:30 PM Medical Record Number: [id number] Patient Account Number: [id number] Date of Birth/Sex: Treating RN: Jan 26, 1956 (67 y.o. [name] Primary Care Zain Bingman: [name] Other Clinician: Referring Kysean Sweet: Treating Augustine Leverette/Extender: [name] in Treatment: 24 Active Problems Location of Pain Severity and Description of Pain Patient Has Paino Yes Site Locations Rate the pain. Current Pain Level: 5 Pain Management and Medication Current Pain Management: Electronic Signature(s) Signed: 06/03/2023 3:48:27 PM By: [name] Entered By: [name] on 06/03/2023 09:56:15 [name], [name] ([id number]) [id number] Page 6 of 9 -------------------------------------------------------------------------------- Patient/Caregiver Education Details Patient Name: Date of Service: Daleen Bo 9/10/2024andnbsp12:30 PM Medical Record Number: [id number] Patient Account Number: [id number] Date of Birth/Gender: Treating RN: May 01, 1956 (67 y.o. [name] Primary Care Physician: [name] Other Clinician: Referring Physician: Treating Physician/Extender: [name] in Treatment: 24 Education Assessment Education Provided  To: Patient Education Topics Provided Wound/Skin Impairment: Methods: Explain/Verbal Responses: State content correctly [name]) Signed: 06/03/2023 3:48:27 PM By: [name] Entered By: [name] on 06/03/2023 09:23:19 -------------------------------------------------------------------------------- Wound Assessment Details Patient Name: Date of Service: [name] 06/03/2023 12:30 PM Medical Record Number: [id number] Patient Account Number: [id number] Date of Birth/Sex: Treating RN: 06-28-1956 (67 y.o. [name] Primary Care Jelene Albano: [name] Other Clinician: Referring Crayton Savarese: Treating Annakate Soulier/Extender: [name] in Treatment: 24 Wound Status Wound Number: 30 Primary Venous Leg Ulcer Etiology: Wound Location: Left, Posterior Lower Leg Wound Open Wounding Event: Gradually Appeared Status: Date Acquired: 09/23/2020 Comorbid Anemia, Chronic Obstructive Pulmonary Disease (COPD), Sleep Weeks Of Treatment: 24 History: Apnea, Congestive Heart Failure, Coronary Artery Disease, Clustered Wound: No Hypertension, Peripheral Venous Disease, Type II Diabetes, Osteoarthritis, Neuropathy Wound Measurements Length: (cm) 14.3 Width: (cm) 12.7 Depth: (cm) 0.3 Area: (cm) 142.636 Volume: (cm) 42.791 % Reduction in Area: 66.5% % Reduction in Volume: 49.8% Tunneling: No Undermining: No Wound Description Classification: Full Thickness Without Exposed Support Exudate Amount: Large Exudate Type: Serosanguineous Exudate Color: red, brown Structures Periwound Skin Texture [name], [name] ([id number]) [id number] Page 7 of 9 Texture Color No Abnormalities Noted: No No Abnormalities Noted: No Moisture No Abnormalities Noted: No Treatment Notes Wound #30 (Lower Leg) Wound Laterality: Left, Posterior Cleanser Soap and Water Discharge Instruction: May shower and wash wound with dial antibacterial  soap and water prior to dressing change. Peri-Wound Care Topical Triamcinolone Discharge Instruction: Apply Triamcinolone as directed topical compounding antibiotics from [facility] Discharge Instruction: once arrives discontinue dakins. Spray then apply calcium alginate Ag. Primary Dressing Maxorb Extra Ag+ Alginate Dressing, 4x4.75 (in/in) Discharge Instruction: Apply over the antibiotic spray ****once the topical compounding antibiotics arrive. Secondary Dressing ABD Pad, 8x10 Discharge Instruction: Apply over primary dressing as directed. Secured With [employer], 4.5x3.1 (in/yd) Discharge Instruction: Secure with Kerlix as directed. Compression Wrap Kerlix Roll 4.5x3.1 (in/yd) Discharge Instruction: Apply Kerlix and  Coban compression as directed. Coban Self-Adherent Wrap 4x5 (in/yd) Discharge Instruction: Apply over Kerlix as directed. Compression Stockings Add-Ons Electronic Signature(s) Signed: 06/03/2023 3:48:27 PM By: [name] Entered By: [name] on 06/03/2023 09:59:59 -------------------------------------------------------------------------------- Wound Assessment Details Patient Name: Date of Service: [name] 06/03/2023 12:30 PM Medical Record Number: [id number] Patient Account Number: [id number] Date of Birth/Sex: Treating RN: [birth year] (67 y.o. [name] Primary Care Chakia Counts: [name] Other Clinician: Referring Sonny Poth: Treating Elias Bordner/Extender: [name] in Treatment: 24 Wound Status Wound Number: 31 Primary Venous Leg Ulcer Etiology: Wound Location: Right, Medial Lower Leg Wound Open Wounding Event: Gradually Appeared Status: Date Acquired: 10/31/2022 Comorbid Anemia, Chronic Obstructive Pulmonary Disease (COPD), Sleep Weeks Of Treatment: 24 History: Apnea, Congestive Heart Failure, Coronary Artery Disease, Clustered Wound: No Hypertension, Peripheral Venous Disease, Type II  Diabetes, Osteoarthritis, Neuropathy Wound Measurements [name], [name] ([id number]) Length: (cm) 3.2 Width: (cm) 3.7 Depth: (cm) 0.2 Area: (cm) 9.299 Volume: (cm) 1.86 [id number] Page 8 of 9 % Reduction in Area: -97.3% % Reduction in Volume: -97.5% Epithelialization: Small (1-33%) Tunneling: No Undermining: No Wound Description Classification: Full Thickness Without Exposed Support Structures Wound Margin: Distinct, outline attached Exudate Amount: Medium Exudate Type: Serosanguineous Exudate Color: red, brown Foul Odor After Cleansing: No Slough/Fibrino No Wound Bed Granulation Amount: Large (67-100%) Exposed Structure Granulation Quality: Red Fascia Exposed: No Necrotic Amount: Small (1-33%) Fat Layer (Subcutaneous Tissue) Exposed: Yes Necrotic Quality: Adherent Slough Tendon Exposed: No Muscle Exposed: No Joint Exposed: No Bone Exposed: No Periwound Skin Texture Texture Color No Abnormalities Noted: No No Abnormalities Noted: No Callus: No Atrophie Blanche: No Crepitus: No Cyanosis: No Excoriation: No Ecchymosis: No Induration: No Erythema: No Rash: No Hemosiderin Staining: Yes Scarring: No Mottled: No Pallor: No Moisture Rubor: No No Abnormalities Noted: No Dry / Scaly: Yes Maceration: No Treatment Notes Wound #31 (Lower Leg) Wound Laterality: Right, Medial Cleanser Soap and Water Discharge Instruction: May shower and wash wound with dial antibacterial soap and water prior to dressing change. Peri-Wound Care Triamcinolone 15 (g) Discharge Instruction: Use triamcinolone 15 (g) as directed Topical topical compounding antibiotics from [facility] Discharge Instruction: once arrives discontinue dakins. Spray then apply calcium alginate Ag. Primary Dressing Maxorb Extra Ag+ Alginate Dressing, 4x4.75 (in/in) Discharge Instruction: Apply over the antibiotic spray ****once the topical compounding antibiotics  arrive. Secondary Dressing ABD Pad, 8x10 Discharge Instruction: Apply over primary dressing as directed. Secured With [employer], 4.5x3.1 (in/yd) Discharge Instruction: Secure with Kerlix as directed. Compression Wrap Kerlix Roll 4.5x3.1 (in/yd) Discharge Instruction: Apply Kerlix and Coban compression as directed. Coban Self-Adherent Wrap 4x5 (in/yd) Discharge Instruction: Apply over Kerlix as directed. Compression Stockings Add-Ons [name], [name] ([id number]) [id number] Page 9 of 9 Electronic Signature(s) Signed: 06/03/2023 3:48:27 PM By: [name] Entered By: [name] on 06/03/2023 10:00:18 -------------------------------------------------------------------------------- Vitals Details Patient Name: Date of Service: [name] 06/03/2023 12:30 PM Medical Record Number: [id number] Patient Account Number: [id number] Date of Birth/Sex: Treating RN: 06/22/1956 (67 y.o. [name] Primary Care Leiyah Maultsby: [name] Other Clinician: Referring Nykeria Mealing: Treating Rosaleah Person/Extender: [name] in Treatment: 24 Vital Signs Time Taken: 12:39 Temperature (F): 98.9 Weight (lbs): 245.6 Pulse (bpm): 74 Respiratory Rate (breaths/min): 18 Blood Pressure (mmHg): 122/69 Reference Range: 80 - 120 mg / dl Electronic Signature(s) Signed: 06/03/2023 3:48:27 PM By: [name] Entered By: [name] on 06/03/2023 09:39:59

## 2023-06-04 DIAGNOSIS — I7389 Other specified peripheral vascular diseases: Secondary | ICD-10-CM | POA: Diagnosis not present

## 2023-06-04 DIAGNOSIS — R293 Abnormal posture: Secondary | ICD-10-CM | POA: Diagnosis not present

## 2023-06-05 DIAGNOSIS — I7389 Other specified peripheral vascular diseases: Secondary | ICD-10-CM | POA: Diagnosis not present

## 2023-06-05 DIAGNOSIS — R293 Abnormal posture: Secondary | ICD-10-CM | POA: Diagnosis not present

## 2023-06-06 DIAGNOSIS — I7389 Other specified peripheral vascular diseases: Secondary | ICD-10-CM | POA: Diagnosis not present

## 2023-06-06 DIAGNOSIS — R293 Abnormal posture: Secondary | ICD-10-CM | POA: Diagnosis not present

## 2023-06-10 DIAGNOSIS — I7389 Other specified peripheral vascular diseases: Secondary | ICD-10-CM | POA: Diagnosis not present

## 2023-06-10 DIAGNOSIS — R293 Abnormal posture: Secondary | ICD-10-CM | POA: Diagnosis not present

## 2023-06-11 DIAGNOSIS — R52 Pain, unspecified: Secondary | ICD-10-CM | POA: Diagnosis not present

## 2023-06-11 DIAGNOSIS — Z5181 Encounter for therapeutic drug level monitoring: Secondary | ICD-10-CM | POA: Diagnosis not present

## 2023-06-11 DIAGNOSIS — I10 Essential (primary) hypertension: Secondary | ICD-10-CM | POA: Diagnosis not present

## 2023-06-11 DIAGNOSIS — R293 Abnormal posture: Secondary | ICD-10-CM | POA: Diagnosis not present

## 2023-06-11 DIAGNOSIS — I7389 Other specified peripheral vascular diseases: Secondary | ICD-10-CM | POA: Diagnosis not present

## 2023-06-12 DIAGNOSIS — R293 Abnormal posture: Secondary | ICD-10-CM | POA: Diagnosis not present

## 2023-06-12 DIAGNOSIS — I7389 Other specified peripheral vascular diseases: Secondary | ICD-10-CM | POA: Diagnosis not present

## 2023-06-13 DIAGNOSIS — I7389 Other specified peripheral vascular diseases: Secondary | ICD-10-CM | POA: Diagnosis not present

## 2023-06-13 DIAGNOSIS — R293 Abnormal posture: Secondary | ICD-10-CM | POA: Diagnosis not present

## 2023-06-14 DIAGNOSIS — I7389 Other specified peripheral vascular diseases: Secondary | ICD-10-CM | POA: Diagnosis not present

## 2023-06-14 DIAGNOSIS — R293 Abnormal posture: Secondary | ICD-10-CM | POA: Diagnosis not present

## 2023-06-16 DIAGNOSIS — R293 Abnormal posture: Secondary | ICD-10-CM | POA: Diagnosis not present

## 2023-06-16 DIAGNOSIS — I7389 Other specified peripheral vascular diseases: Secondary | ICD-10-CM | POA: Diagnosis not present

## 2023-06-17 DIAGNOSIS — R293 Abnormal posture: Secondary | ICD-10-CM | POA: Diagnosis not present

## 2023-06-17 DIAGNOSIS — I7389 Other specified peripheral vascular diseases: Secondary | ICD-10-CM | POA: Diagnosis not present

## 2023-06-18 DIAGNOSIS — I7389 Other specified peripheral vascular diseases: Secondary | ICD-10-CM | POA: Diagnosis not present

## 2023-06-18 DIAGNOSIS — R293 Abnormal posture: Secondary | ICD-10-CM | POA: Diagnosis not present

## 2023-06-23 DIAGNOSIS — I7389 Other specified peripheral vascular diseases: Secondary | ICD-10-CM | POA: Diagnosis not present

## 2023-06-23 DIAGNOSIS — R293 Abnormal posture: Secondary | ICD-10-CM | POA: Diagnosis not present

## 2023-06-25 DIAGNOSIS — I10 Essential (primary) hypertension: Secondary | ICD-10-CM | POA: Diagnosis not present

## 2023-06-25 DIAGNOSIS — F32A Depression, unspecified: Secondary | ICD-10-CM | POA: Diagnosis not present

## 2023-06-25 DIAGNOSIS — E119 Type 2 diabetes mellitus without complications: Secondary | ICD-10-CM | POA: Diagnosis not present

## 2023-06-25 DIAGNOSIS — J449 Chronic obstructive pulmonary disease, unspecified: Secondary | ICD-10-CM | POA: Diagnosis not present

## 2023-06-30 DIAGNOSIS — E1165 Type 2 diabetes mellitus with hyperglycemia: Secondary | ICD-10-CM | POA: Diagnosis not present

## 2023-06-30 DIAGNOSIS — R197 Diarrhea, unspecified: Secondary | ICD-10-CM | POA: Diagnosis not present

## 2023-07-01 ENCOUNTER — Encounter (HOSPITAL_BASED_OUTPATIENT_CLINIC_OR_DEPARTMENT_OTHER): Payer: Medicare HMO | Admitting: Internal Medicine

## 2023-07-10 DIAGNOSIS — E1165 Type 2 diabetes mellitus with hyperglycemia: Secondary | ICD-10-CM | POA: Diagnosis not present

## 2023-07-15 ENCOUNTER — Encounter (HOSPITAL_BASED_OUTPATIENT_CLINIC_OR_DEPARTMENT_OTHER): Payer: Medicare HMO | Attending: Internal Medicine | Admitting: Internal Medicine

## 2023-07-15 DIAGNOSIS — E11621 Type 2 diabetes mellitus with foot ulcer: Secondary | ICD-10-CM | POA: Diagnosis not present

## 2023-07-15 DIAGNOSIS — I872 Venous insufficiency (chronic) (peripheral): Secondary | ICD-10-CM | POA: Diagnosis not present

## 2023-07-15 DIAGNOSIS — L97812 Non-pressure chronic ulcer of other part of right lower leg with fat layer exposed: Secondary | ICD-10-CM | POA: Diagnosis not present

## 2023-07-15 DIAGNOSIS — L97522 Non-pressure chronic ulcer of other part of left foot with fat layer exposed: Secondary | ICD-10-CM | POA: Insufficient documentation

## 2023-07-15 DIAGNOSIS — L97222 Non-pressure chronic ulcer of left calf with fat layer exposed: Secondary | ICD-10-CM | POA: Diagnosis not present

## 2023-07-22 DIAGNOSIS — Z23 Encounter for immunization: Secondary | ICD-10-CM | POA: Diagnosis not present

## 2023-08-07 DIAGNOSIS — N179 Acute kidney failure, unspecified: Secondary | ICD-10-CM | POA: Diagnosis not present

## 2023-08-07 DIAGNOSIS — M6281 Muscle weakness (generalized): Secondary | ICD-10-CM | POA: Diagnosis not present

## 2023-08-08 DIAGNOSIS — N179 Acute kidney failure, unspecified: Secondary | ICD-10-CM | POA: Diagnosis not present

## 2023-08-08 DIAGNOSIS — M6281 Muscle weakness (generalized): Secondary | ICD-10-CM | POA: Diagnosis not present

## 2023-08-09 DIAGNOSIS — N179 Acute kidney failure, unspecified: Secondary | ICD-10-CM | POA: Diagnosis not present

## 2023-08-09 DIAGNOSIS — M6281 Muscle weakness (generalized): Secondary | ICD-10-CM | POA: Diagnosis not present

## 2023-08-11 ENCOUNTER — Encounter (HOSPITAL_BASED_OUTPATIENT_CLINIC_OR_DEPARTMENT_OTHER): Payer: Medicare HMO | Attending: Internal Medicine | Admitting: Internal Medicine

## 2023-08-11 DIAGNOSIS — E440 Moderate protein-calorie malnutrition: Secondary | ICD-10-CM | POA: Diagnosis not present

## 2023-08-11 DIAGNOSIS — L97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ed: Secondary | ICD-10-CM | POA: Insufficient documentation

## 2023-08-11 DIAGNOSIS — F17200 Nicotine dependence, unspecified, uncomplicated: Secondary | ICD-10-CM | POA: Diagnosis not present

## 2023-08-11 DIAGNOSIS — L97912 Non-pressure chronic ulcer of unspecified part of right lower leg with fat layer exposed: Secondary | ICD-10-CM | POA: Insufficient documentation

## 2023-08-11 DIAGNOSIS — I890 Lymphedema, not elsewhere classified: Secondary | ICD-10-CM | POA: Insufficient documentation

## 2023-08-11 DIAGNOSIS — I872 Venous insufficiency (chronic) (peripheral): Secondary | ICD-10-CM | POA: Diagnosis not present

## 2023-08-11 DIAGNOSIS — E11622 Type 2 diabetes mellitus with other skin ulcer: Secondary | ICD-10-CM | POA: Diagnosis not present

## 2023-08-11 DIAGNOSIS — I110 Hypertensive heart disease with heart failure: Secondary | ICD-10-CM | POA: Diagnosis not present

## 2023-08-11 DIAGNOSIS — Z794 Long term (current) use of insulin: Secondary | ICD-10-CM | POA: Insufficient documentation

## 2023-08-11 DIAGNOSIS — E1140 Type 2 diabetes mellitus with diabetic neuropathy, unspecified: Secondary | ICD-10-CM | POA: Diagnosis not present

## 2023-08-11 DIAGNOSIS — I87333 Chronic venous hypertension (idiopathic) with ulcer and inflammation of bilateral lower extremity: Secondary | ICD-10-CM | POA: Insufficient documentation

## 2023-08-11 DIAGNOSIS — I5032 Chronic diastolic (congestive) heart failure: Secondary | ICD-10-CM | POA: Insufficient documentation

## 2023-08-11 DIAGNOSIS — I2510 Atherosclerotic heart disease of native coronary artery without angina pectoris: Secondary | ICD-10-CM | POA: Diagnosis not present

## 2023-08-11 DIAGNOSIS — J449 Chronic obstructive pulmonary disease, unspecified: Secondary | ICD-10-CM | POA: Diagnosis not present

## 2023-08-11 DIAGNOSIS — L97812 Non-pressure chronic ulcer of other part of right lower leg with fat layer exposed: Secondary | ICD-10-CM | POA: Diagnosis not present

## 2023-08-11 DIAGNOSIS — D649 Anemia, unspecified: Secondary | ICD-10-CM | POA: Diagnosis not present

## 2023-08-11 DIAGNOSIS — L97222 Non-pressure chronic ulcer of left calf with fat layer exposed: Secondary | ICD-10-CM | POA: Diagnosis not present

## 2023-08-11 DIAGNOSIS — N179 Acute kidney failure, unspecified: Secondary | ICD-10-CM | POA: Diagnosis not present

## 2023-08-11 DIAGNOSIS — M6281 Muscle weakness (generalized): Secondary | ICD-10-CM | POA: Diagnosis not present

## 2023-08-12 DIAGNOSIS — M6281 Muscle weakness (generalized): Secondary | ICD-10-CM | POA: Diagnosis not present

## 2023-08-12 DIAGNOSIS — L97929 Non-pressure chronic ulcer of unspecified part of left lower leg with unspecified severity: Secondary | ICD-10-CM | POA: Diagnosis not present

## 2023-08-12 DIAGNOSIS — L97919 Non-pressure chronic ulcer of unspecified part of right lower leg with unspecified severity: Secondary | ICD-10-CM | POA: Diagnosis not present

## 2023-08-12 DIAGNOSIS — N179 Acute kidney failure, unspecified: Secondary | ICD-10-CM | POA: Diagnosis not present

## 2023-08-12 NOTE — Progress Notes (Signed)
Pisarski, Alvar ([id number]) [id number] Page 1 of 12 Visit Report for 08/11/2023 Arrival Information Details Patient Name: Date of Service: [name], [name] 08/11/2023 2:15 PM Medical Record Number: [id number] Patient Account Number: [id number] Date of Birth/Sex: Treating RN: 04-15-1956 (67 y.o. M) Primary Care Tambi Thole: Renford Dills Other Clinician: Referring Donesha Wallander: Treating Melissaann Dizdarevic/Extender: Gwyneth Revels in Treatment: 33 Visit Information History Since Last Visit Added or deleted any medications: No Patient Arrived: Wheel Chair Any new allergies or adverse reactions: No Arrival Time: 13:59 Had [name] fall or experienced change in No Accompanied By: self activities of daily living that may affect Transfer Assistance: None risk of falls: Patient Identification Verified: Yes Signs or symptoms of abuse/neglect since last visito No Secondary Verification Process Completed: Yes Hospitalized since last visit: No Patient Requires Transmission-Based Precautions: No Implantable device outside of the clinic excluding No Patient Has Alerts: No cellular tissue based products placed in the center since last visit: Has Dressing in Place as Prescribed: Yes Has Compression in Place as Prescribed: Yes Pain Present Now: Yes Electronic Signature(s) Signed: 08/11/2023 4:25:57 PM By: Thayer Dallas Entered By: Thayer Dallas on 08/11/2023 11:04:05 -------------------------------------------------------------------------------- Clinic Level of Care Assessment Details Patient Name: Date of Service: [name], [name] 08/11/2023 2:15 PM Medical Record Number: [id number] Patient Account Number: [id number] Date of Birth/Sex: Treating RN: 30-Sep-1955 (67 y.o. Harlon Flor, Millard.Loa Primary Care Isiaah Cuervo: Renford Dills Other Clinician: Referring Andriel Omalley: Treating Amor Packard/Extender: Gwyneth Revels in Treatment: 33 Clinic Level of Care  Assessment Items TOOL 4 Quantity Score []  - 0 Use when only an EandM is performed on FOLLOW-UP visit ASSESSMENTS - Nursing Assessment / Reassessment X- 1 10 Reassessment of Co-morbidities (includes updates in patient status) X- 1 5 Reassessment of Adherence to Treatment Plan ASSESSMENTS - Wound and Skin [name] ssessment / Reassessment []  - 0 Simple Wound Assessment / Reassessment - one wound X- 2 5 Complex Wound Assessment / Reassessment - multiple wounds X- 1 10 Dermatologic / Skin Assessment (not related to wound area) ASSESSMENTS - Focused Assessment X- 2 5 Circumferential Edema Measurements - multi extremities []  - 0 Nutritional Assessment / Counseling / Intervention Walby, Juvon ([id number]) [id number] Page 2 of 12 []  - 0 Lower Extremity Assessment (monofilament, tuning fork, pulses) []  - 0 Peripheral Arterial Disease Assessment (using hand held doppler) ASSESSMENTS - Ostomy and/or Continence Assessment and Care []  - 0 Incontinence Assessment and Management []  - 0 Ostomy Care Assessment and Management (repouching, etc.) PROCESS - Coordination of Care []  - 0 Simple Patient / Family Education for ongoing care X- 1 20 Complex (extensive) Patient / Family Education for ongoing care X- 1 10 Staff obtains Chiropractor, Records, T Results / Process Orders est X- 1 10 Staff telephones HHA, Nursing Homes / Clarify orders / etc []  - 0 Routine Transfer to another Facility (non-emergent condition) []  - 0 Routine Hospital Admission (non-emergent condition) []  - 0 New Admissions / Manufacturing engineer / Ordering NPWT Apligraf, etc. , []  - 0 Emergency Hospital Admission (emergent condition) []  - 0 Simple Discharge Coordination X- 1 15 Complex (extensive) Discharge Coordination PROCESS - Special Needs []  - 0 Pediatric / Minor Patient Management []  - 0 Isolation Patient Management []  - 0 Hearing / Language / Visual special needs []  - 0 Assessment  of Community assistance (transportation, D/C planning, etc.) []  - 0 Additional assistance / Altered mentation []  - 0 Support Surface(s) Assessment (bed, cushion, seat, etc.) INTERVENTIONS - Wound Cleansing / Measurement []  - 0 Simple Wound  Cleansing - one wound X- 2 5 Complex Wound Cleansing - multiple wounds X- 1 5 Wound Imaging (photographs - any number of wounds) []  - 0 Wound Tracing (instead of photographs) []  - 0 Simple Wound Measurement - one wound X- 2 5 Complex Wound Measurement - multiple wounds INTERVENTIONS - Wound Dressings []  - 0 Small Wound Dressing one or multiple wounds []  - 0 Medium Wound Dressing one or multiple wounds X- 2 20 Large Wound Dressing one or multiple wounds []  - 0 Application of Medications - topical []  - 0 Application of Medications - injection INTERVENTIONS - Miscellaneous []  - 0 External ear exam []  - 0 Specimen Collection (cultures, biopsies, blood, body fluids, etc.) []  - 0 Specimen(s) / Culture(s) sent or taken to Lab for analysis []  - 0 Patient Transfer (multiple staff / Nurse, adult / Similar devices) []  - 0 Simple Staple / Suture removal (25 or less) []  - 0 Complex Staple / Suture removal (26 or more) []  - 0 Hypo / Hyperglycemic Management (close monitor of Blood Glucose) Doro, Crayton ([id number]) [id number] Page 3 of 12 []  - 0 Ankle / Brachial Index (ABI) - do not check if billed separately X- 1 5 Vital Signs Has the patient been seen at the hospital within the last three years: Yes Total Score: 170 Level Of Care: New/Established - Level 5 Electronic Signature(s) Signed: 08/11/2023 5:09:00 PM By: Shawn Stall RN, BSN Entered By: Shawn Stall on 08/11/2023 11:31:40 -------------------------------------------------------------------------------- Encounter Discharge Information Details Patient Name: Date of Service: [name], [name] 08/11/2023 2:15 PM Medical Record Number: [id number] Patient  Account Number: [id number] Date of Birth/Sex: Treating RN: 03-26-1956 (67 y.o. [name] Primary Care Jaykwon Morones: Renford Dills Other Clinician: Referring Carolie Mcilrath: Treating Alexus Galka/Extender: Gwyneth Revels in Treatment: 33 Encounter Discharge Information Items Discharge Condition: Stable Ambulatory Status: Wheelchair Discharge Destination: Home Transportation: Private Auto Accompanied By: self Schedule Follow-up Appointment: Yes Clinical Summary of Care: Electronic Signature(s) Signed: 08/11/2023 5:09:00 PM By: Shawn Stall RN, BSN Entered By: Shawn Stall on 08/11/2023 11:32:12 -------------------------------------------------------------------------------- Lower Extremity Assessment Details Patient Name: Date of Service: [name], [name] 08/11/2023 2:15 PM Medical Record Number: [id number] Patient Account Number: [id number] Date of Birth/Sex: Treating RN: [birth year] (67 y.o. M) Primary Care Leighton Luster: Renford Dills Other Clinician: Referring Rashidah Belle[place]: Treating Nyna Chilton/Extender: Gwyneth Revels in Treatment: 33 Edema Assessment Assessed: Kyra Searles: No] Franne Forts: No] Edema: [Left: Yes] [Right: Yes] Calf Left: Right: Point of Measurement: From Medial Instep 44 cm 44 cm Ankle Left: Right: Point of Measurement: From Medial Instep 24 cm 24 cm Vascular Assessment Left: [id number] Page 4 of 12Right:] Extremity colors, hair growth, and conditions: Extremity Color: [[id number] Page 4 of 12Normal Normal] Hair Growth on Extremity: [[id number] Page 4 of 12No No] Temperature of Extremity: [[id number].pdf Page 4 of 12Cool Cool] Capillary Refill: [id number] Page 4 of 12< 3 seconds < 3 seconds] Dependent Rubor: [[id number] Page 4 of 12No No No No] Electronic Signature(s) Signed:  08/11/2023 4:25:57 PM By: Thayer Dallas Entered By: Thayer Dallas on 08/11/2023 11:19:54 -------------------------------------------------------------------------------- Multi Wound Chart Details Patient Name: Date of Service: [name], [name] 08/11/2023 2:15 PM Medical Record Number: [id number] Patient Account Number: [id number] Date of Birth/Sex: Treating RN: 05-Aug-1956 (67 y.o. M) Primary Care Kahiau Schewe: Renford Dills Other Clinician: Referring Anthone Prieur: Treating Syniah Berne/Extender: Gwyneth Revels in Treatment: 33 Vital Signs Height(in): Pulse(bpm): 76 Weight(lbs): 245.6 Blood Pressure(mmHg): 115/59 Body Mass Index(BMI): Temperature(F): 98.8 Respiratory Rate(breaths/min): 18 [30:Photos:] [[name]:[name]] Left, Posterior Lower Leg Right,  Medial Lower Leg [name] Wound Location: Gradually Appeared Gradually Appeared [name] Wounding Event: Venous Leg Ulcer Venous Leg Ulcer [name] Primary Etiology: Anemia, Chronic Obstructive Anemia, Chronic Obstructive [name] Comorbid History: Pulmonary Disease (COPD), Sleep Pulmonary Disease (COPD), Sleep Apnea, Congestive Heart Failure, Apnea, Congestive Heart Failure, Coronary Artery Disease, Coronary Artery Disease, Hypertension, Peripheral Venous Hypertension, Peripheral Venous Disease, Type II Diabetes, Disease, Type II Diabetes, Osteoarthritis, Neuropathy Osteoarthritis, Neuropathy 09/23/2020 10/31/2022 [name] Date Acquired: 79 33 [name] Weeks of Treatment: Open Open [name] Wound Status: No No [name] Wound Recurrence: 13x11x0.2 1.5x0.5x0.2 [name] Measurements L x W x D (cm) 112.312 0.589 [name] [name] (cm) : rea 22.462 0.118 [name] Volume (cm) : 73.60% 87.50% [name] % Reduction in Area: 73.60% 87.50% [name] % Reduction in Volume: Abdo, Tian ([id number]) [id number] Page 5 of 12 Full Thickness Without Exposed Full Thickness Without Exposed [name] Classification: Support Structures Support Structures Large Medium [name] Exudate  Amount: Serosanguineous Serosanguineous [name] Exudate Type: red, brown red, brown [name] Exudate Color: [name] Distinct, outline attached [name] Wound Margin: Large (67-100%) Large (67-100%) [name] Granulation Amount: Red, Friable Red [name] Granulation Quality: Small (1-33%) Small (1-33%) [name] Necrotic Amount: Fat Layer (Subcutaneous Tissue): Yes Fat Layer (Subcutaneous Tissue): Yes [name] Exposed Structures: Fascia: No Fascia: No Tendon: No Tendon: No Muscle: No Muscle: No Joint: No Joint: No Bone: No Bone: No Small (1-33%) Large (67-100%) [name] Epithelialization: Excoriation: No Excoriation: No [name] Periwound Skin Texture: Induration: No Induration: No Callus: No Callus: No Crepitus: No Crepitus: No Rash: No Rash: No Scarring: No Scarring: No Dry/Scaly: Yes Dry/Scaly: Yes [name] Periwound Skin Moisture: Maceration: No Maceration: No Atrophie Blanche: No Hemosiderin Staining: Yes [name] Periwound Skin Color: Cyanosis: No Atrophie Blanche: No Ecchymosis: No Cyanosis: No Erythema: No Ecchymosis: No Hemosiderin Staining: No Erythema: No Mottled: No Mottled: No Pallor: No Pallor: No Rubor: No Rubor: No Treatment Notes Wound #30 (Lower Leg) Wound Laterality: Left, Posterior Cleanser Soap and Water Discharge Instruction: May shower and wash wound with dial antibacterial soap and water prior to dressing change. Peri-Wound Care Topical Triamcinolone Discharge Instruction: Apply Triamcinolone as directed topical compounding antibiotics from [facility] Discharge Instruction: Spray then apply calcium alginate Ag. Primary Dressing Maxorb Extra Ag+ Alginate Dressing, 4x4.75 (in/in) Discharge Instruction: Apply over the antibiotic spray ****once the topical compounding antibiotics arrive. Secondary Dressing ABD Pad, 8x10 Discharge Instruction: Apply over primary dressing as directed. Secured With [employer], 4.5x3.1 (in/yd) Discharge Instruction: Secure with  Kerlix as directed. Compression Wrap Kerlix Roll 4.5x3.1 (in/yd) Discharge Instruction: Apply Kerlix and Coban compression as directed. Coban Self-Adherent Wrap 4x5 (in/yd) Discharge Instruction: Apply over Kerlix as directed. Compression Stockings Add-Ons Wound #31 (Lower Leg) Wound Laterality: Right, Medial Cleanser Soap and Water Discharge Instruction: May shower and wash wound with dial antibacterial soap and water prior to dressing change. Peri-Wound Care Triamcinolone 15 (g) Astorga, Eliceo ([id number]) [id number] Page 6 of 12 Discharge Instruction: Use triamcinolone 15 (g) as directed Topical topical compounding antibiotics from [facility] Discharge Instruction: Spray then apply calcium alginate Ag. Primary Dressing Maxorb Extra Ag+ Alginate Dressing, 4x4.75 (in/in) Discharge Instruction: Apply over the antibiotic spray ****once the topical compounding antibiotics arrive. Secondary Dressing ABD Pad, 8x10 Discharge Instruction: Apply over primary dressing as directed. Secured With [employer], 4.5x3.1 (in/yd) Discharge Instruction: Secure with Kerlix as directed. Compression Wrap Kerlix Roll 4.5x3.1 (in/yd) Discharge Instruction: Apply Kerlix and Coban compression as directed. Coban Self-Adherent Wrap 4x5 (in/yd) Discharge Instruction: Apply over Kerlix as directed. Compression Stockings Add-Ons Electronic Signature(s) Signed: 08/11/2023 4:31:26 PM  By: Baltazar Najjar MD Entered By: Baltazar Najjar on 08/11/2023 11:39:45 -------------------------------------------------------------------------------- Multi-Disciplinary Care Plan Details Patient Name: Date of Service: [name], [name] 08/11/2023 2:15 PM Medical Record Number: [id number] Patient Account Number: [id number] Date of Birth/Sex: Treating RN: 12/09/1955 (67 y.o. [name] Primary Care Kaedance Magos: Renford Dills Other Clinician: Referring Christoffer Currier: Treating  Raed Schalk/Extender: Gwyneth Revels in Treatment: 33 Active Inactive Pain, Acute or Chronic Nursing Diagnoses: Pain, acute or chronic: actual or potential Potential alteration in comfort, pain Goals: Patient will verbalize adequate pain control and receive pain control interventions during procedures as needed Date Initiated: 12/17/2022 Target Resolution Date: 11/21/2023 Goal Status: Active Interventions: Encourage patient to take pain medications as prescribed Provide education on pain management Reposition patient for comfort Treatment Activities: Administer pain control measures as ordered : 12/17/2022 Notes: Strahle, Jarrid ([id number]) [id number] Page 7 of 12 Wound/Skin Impairment Nursing Diagnoses: Knowledge deficit related to ulceration/compromised skin integrity Goals: Patient/caregiver will verbalize understanding of skin care regimen Date Initiated: 12/17/2022 Target Resolution Date: 11/20/2023 Goal Status: Active Interventions: Assess patient/caregiver ability to perform ulcer/skin care regimen upon admission and as needed Assess ulceration(s) every visit Provide education on ulcer and skin care Treatment Activities: Skin care regimen initiated : 12/17/2022 Topical wound management initiated : 12/17/2022 Notes: Electronic Signature(s) Signed: 08/11/2023 5:09:00 PM By: Shawn Stall RN, BSN Entered By: Shawn Stall on 08/11/2023 11:21:08 -------------------------------------------------------------------------------- Pain Assessment Details Patient Name: Date of Service: [name], [name] 08/11/2023 2:15 PM Medical Record Number: [id number] Patient Account Number: [id number] Date of Birth/Sex: Treating RN: 1955/12/12 (67 y.o. M) Primary Care Monnica Saltsman: Renford Dills Other Clinician: Referring Kahmari Herard: Treating Caniya Tagle/Extender: Gwyneth Revels in Treatment: 33 Active Problems Location of Pain  Severity and Description of Pain Patient Has Paino Yes Site Locations Pain Location: Generalized Pain, Pain in Ulcers Rate the pain. Current Pain Level: 7 Pain Management and Medication Current Pain Management: Electronic Signature(s) Signed: 08/11/2023 4:25:57 PM By: Thayer Dallas Entered By: Thayer Dallas on 08/11/2023 11:04:42 Laury, Titus ([id number]) [id number] Page 8 of 12 -------------------------------------------------------------------------------- Patient/Caregiver Education Details Patient Name: Date of Service: Daleen Bo 11/18/2024andnbsp2:15 PM Medical Record Number: [id number] Patient Account Number: [id number] Date of Birth/Gender: Treating RN: 11/20/1955 (67 y.o. [name] Primary Care Physician: Renford Dills Other Clinician: Referring Physician: Treating Physician/Extender: Gwyneth Revels in Treatment: 92 Education Assessment Education Provided To: Patient Education Topics Provided Wound/Skin Impairment: Handouts: Caring for Your Ulcer Methods: Explain/Verbal Responses: Reinforcements needed Electronic Signature(s) Signed: 08/11/2023 5:09:00 PM By: Shawn Stall RN, BSN Entered By: Shawn Stall on 08/11/2023 11:21:24 -------------------------------------------------------------------------------- Wound Assessment Details Patient Name: Date of Service: [name], [name] 08/11/2023 2:15 PM Medical Record Number: [id number] Patient Account Number: [id number] Date of Birth/Sex: Treating RN: 04/27/1956 (67 y.o. M) Primary Care Latesia Norrington: Renford Dills Other Clinician: Referring Clara Herbison: Treating Mashayla Lavin/Extender: Gwyneth Revels in Treatment: 33 Wound Status Wound Number: 30 Primary Venous Leg Ulcer Etiology: Wound Location: Left, Posterior Lower Leg Wound Open Wounding Event: Gradually Appeared Status: Date Acquired: 09/23/2020 Comorbid Anemia, Chronic Obstructive  Pulmonary Disease (COPD), Sleep Weeks Of Treatment: 33 History: Apnea, Congestive Heart Failure, Coronary Artery Disease, Clustered Wound: No Hypertension, Peripheral Venous Disease, Type II Diabetes, Osteoarthritis, Neuropathy Photos Wound Measurements Pflieger, Jaideep ([id number]) Length: (cm) 13 Width: (cm) 11 Depth: (cm) 0.2 Area: (cm) 112.312 Volume: (cm) 22.462 [id number] Page 9 of 12 % Reduction in Area: 73.6% % Reduction in Volume: 73.6% Epithelialization: Small (1-33%) Tunneling: No Undermining: No Wound Description  Classification: Full Thickness Without Exposed Support Structures Exudate Amount: Large Exudate Type: Serosanguineous Exudate Color: red, brown Foul Odor After Cleansing: No Slough/Fibrino Yes Wound Bed Granulation Amount: Large (67-100%) Exposed Structure Granulation Quality: Red, Friable Fascia Exposed: No Necrotic Amount: Small (1-33%) Fat Layer (Subcutaneous Tissue) Exposed: Yes Necrotic Quality: Adherent Slough Tendon Exposed: No Muscle Exposed: No Joint Exposed: No Bone Exposed: No Periwound Skin Texture Texture Color No Abnormalities Noted: No No Abnormalities Noted: No Callus: No Atrophie Blanche: No Crepitus: No Cyanosis: No Excoriation: No Ecchymosis: No Induration: No Erythema: No Rash: No Hemosiderin Staining: No Scarring: No Mottled: No Pallor: No Moisture Rubor: No No Abnormalities Noted: No Dry / Scaly: Yes Maceration: No Treatment Notes Wound #30 (Lower Leg) Wound Laterality: Left, Posterior Cleanser Soap and Water Discharge Instruction: May shower and wash wound with dial antibacterial soap and water prior to dressing change. Peri-Wound Care Topical Triamcinolone Discharge Instruction: Apply Triamcinolone as directed topical compounding antibiotics from [facility] Discharge Instruction: Spray then apply calcium alginate Ag. Primary Dressing Maxorb Extra Ag+ Alginate Dressing,  4x4.75 (in/in) Discharge Instruction: Apply over the antibiotic spray ****once the topical compounding antibiotics arrive. Secondary Dressing ABD Pad, 8x10 Discharge Instruction: Apply over primary dressing as directed. Secured With [employer], 4.5x3.1 (in/yd) Discharge Instruction: Secure with Kerlix as directed. Compression Wrap Kerlix Roll 4.5x3.1 (in/yd) Discharge Instruction: Apply Kerlix and Coban compression as directed. Coban Self-Adherent Wrap 4x5 (in/yd) Discharge Instruction: Apply over Kerlix as directed. Compression Stockings Add-Ons Stoffel, Giang ([id number]) [id number] Page 10 of 12 Electronic Signature(s) Signed: 08/11/2023 4:25:57 PM By: Thayer Dallas Entered By: Thayer Dallas on 08/11/2023 11:21:56 -------------------------------------------------------------------------------- Wound Assessment Details Patient Name: Date of Service: [name], [name] 08/11/2023 2:15 PM Medical Record Number: [id number] Patient Account Number: [id number] Date of Birth/Sex: Treating RN: Dec 27, 1955 (67 y.o. Harlon Flor, Millard.Loa Primary Care Michiah Masse: Renford Dills Other Clinician: Referring Watt Geiler: Treating Naiyah Klostermann/Extender: Gwyneth Revels in Treatment: 33 Wound Status Wound Number: 31 Primary Venous Leg Ulcer Etiology: Wound Location: Right, Medial Lower Leg Wound Open Wounding Event: Gradually Appeared Status: Date Acquired: 10/31/2022 Comorbid Anemia, Chronic Obstructive Pulmonary Disease (COPD), Sleep Weeks Of Treatment: 33 History: Apnea, Congestive Heart Failure, Coronary Artery Disease, Clustered Wound: No Hypertension, Peripheral Venous Disease, Type II Diabetes, Osteoarthritis, Neuropathy Photos Wound Measurements Length: (cm) 1.5 Width: (cm) 0.5 Depth: (cm) 0.2 Area: (cm) 0.589 Volume: (cm) 0.118 % Reduction in Area: 87.5% % Reduction in Volume: 87.5% Epithelialization: Large (67-100%) Tunneling:  No Undermining: No Wound Description Classification: Full Thickness Without Exposed Suppor Wound Margin: Distinct, outline attached Exudate Amount: Medium Exudate Type: Serosanguineous Exudate Color: red, brown t Structures Foul Odor After Cleansing: No Slough/Fibrino No Wound Bed Granulation Amount: Large (67-100%) Exposed Structure Granulation Quality: Red Fascia Exposed: No Necrotic Amount: Small (1-33%) Fat Layer (Subcutaneous Tissue) Exposed: Yes Necrotic Quality: Adherent Slough Tendon Exposed: No Muscle Exposed: No Joint Exposed: No Bone Exposed: No Periwound Skin Texture Texture Color No Abnormalities Noted: No No Abnormalities Noted: No Callus: No Atrophie Blanche: No Crepitus: No Cyanosis: No Excoriation: No Ecchymosis: No Burson, Refael ([id number]) [id number].pdf Page 11 of 12 Induration: No Erythema: No Rash: No Hemosiderin Staining: Yes Scarring: No Mottled: No Pallor: No Moisture Rubor: No No Abnormalities Noted: No Dry / Scaly: Yes Maceration: No Treatment Notes Wound #31 (Lower Leg) Wound Laterality: Right, Medial Cleanser Soap and Water Discharge Instruction: May shower and wash wound with dial antibacterial soap and water prior to dressing change. Peri-Wound Care Triamcinolone 15 (g) Discharge Instruction: Use triamcinolone  15 (g) as directed Topical topical compounding antibiotics from [facility] Discharge Instruction: Spray then apply calcium alginate Ag. Primary Dressing Maxorb Extra Ag+ Alginate Dressing, 4x4.75 (in/in) Discharge Instruction: Apply over the antibiotic spray ****once the topical compounding antibiotics arrive. Secondary Dressing ABD Pad, 8x10 Discharge Instruction: Apply over primary dressing as directed. Secured With [employer], 4.5x3.1 (in/yd) Discharge Instruction: Secure with Kerlix as directed. Compression Wrap Kerlix Roll 4.5x3.1 (in/yd) Discharge Instruction: Apply  Kerlix and Coban compression as directed. Coban Self-Adherent Wrap 4x5 (in/yd) Discharge Instruction: Apply over Kerlix as directed. Compression Stockings Add-Ons Electronic Signature(s) Signed: 08/11/2023 4:25:57 PM By: Thayer Dallas Signed: 08/11/2023 5:09:00 PM By: Shawn Stall RN, BSN Entered By: Thayer Dallas on 08/11/2023 11:22:41 -------------------------------------------------------------------------------- Vitals Details Patient Name: Date of Service: [name], [name] 08/11/2023 2:15 PM Medical Record Number: [id number] Patient Account Number: [id number] Date of Birth/Sex: Treating RN: [birth year] (67 y.o. M) Primary Care Tammera Engert: Renford Dills Other Clinician: Referring Suvi Archuletta: Treating Gwendolyne Welford/Extender: Gwyneth Revels in Treatment: 33 Vital Signs Time Taken: 14:04 Temperature (F): 98.8 Weight (lbs): 245.6 Pulse (bpm): 76 Respiratory Rate (breaths/min): 18 Blood Pressure (mmHg): 115/59 Reference Range: 80 - 120 mg / dl Kalama, Elis ([id number]) [id number] Page 12 of 12 Electronic Signature(s) Signed: 08/11/2023 4:25:57 PM By: Thayer Dallas Entered By: Thayer Dallas on 08/11/2023 11:04:27

## 2023-08-12 NOTE — Progress Notes (Signed)
[name] ([id number]) [id number] Page 1 of 11 Visit Report for 08/11/2023 HPI Details [name], [name] 08/11/2023 2:15 PM Medical Record Number: [id number] Patient Account Number: [id number] Date of Birth/Sex: Treating RN: 12-20-1955 (67 y.o. M) Primary Care Provider: Renford [name] Other Clinician: Referring Provider: Treating Provider/Extender: [name] in Treatment: 33 History of Present Illness HPI Description: 07/07/15; this is a patient who is in hospital on 8/2 through 8/4. He had cellulitis and abscess of predominantly I think the left leg. He received IV antibiotics. Plain x-ray showed no osteomyelitis. An MRI of the left leg did not show osteomyelitis. Cultures showed no predominant organism. His hemoglobin A1c was 9.8. He has a history of venous stasis also peripheral vascular disease. He was discharged to [facility] nursing home. He really has extensive ulcerations on the left lateral leg including a major wound that communicates both posteriorly and superiorly w. When drainage coming out of 2 smaller areas. He has a smaller wound on the posterior medial left leg. He has more predominantly venous insufficiency wounds on predominantly the right medial leg above the ankle the right foot into sections. He has recently been put on Augmentin at the nursing home. Previous ABIs/arterial evaluation showed triphasic waves diffusely he does not have a major ischemic issue a left lower extremity venous duplex also exam showed no evidence of a DVT on 6/21 07/21/15; the patient arrives with really no major change. Culture I did last time was negative. He has not had a follow-up MRI I ordered. The wounds are macerated covered with a thick gelatinous surface slough. There is necrotic subcutaneous tissue 08/04/15; the patient arrives with a considerable improvement in the majority of his wound area. The area on  the left leg now has what looks to be a granulated base. Most of the wounds on the left leg required an aggressive surgical debridement to remove nonviable fibrinous eschar and subcutaneous tissue however after debridement most of this looks better. Although I had asked for repeat MRI of the left leg when he first came in here with grossly purulent material coming out of his wounds, it does not appear that this is been done and nor do I actually feel that strongly about it right now. He has severe surrounding venous insufficiency and inflammation. I don't believe he has significant PAD 08/25/15. In general there is still a considerable wound area here but with still extensive service adherent slough. The patient will not allow mechanical debridement due to pain. He did not tolerate Medihoney therefore we are left with Santyl for now. She would appear that he has severe surrounding venous stasis. There is no evidence of the infection that may have had something to do with the pathogenesis of these wounds 09/29/15; patient still has substantial wound area on the left leg with a cluster of several wounds on the lateral leg confluently on the left leg posteriorly and then a substantial wound on the medial leg. On the right leg a substantial wound medially and a small area on the right lateral foot. All of these underwent a substantial surgical debridement with curettes which she tolerated better than he has in the past. This started as a complex cellulitis in the face of chronic venous insufficiency and inflammation. 10/13/15; substantial cluster of wounds on the lateral aspect of his left leg confluently around to the other side. Extensive surgical debridement to remove for redness surface slough nonviable subcutaneous tissue. This is not  an improvement. Also substantial wound on the medial right leg which is largely unchanged. The etiology of this was felt to be a complex cellulitis in the summer of 2016 in  the face of chronic venous insufficiency and inflammation. The patient states that the wound on the right medial leg has been there for years off and on. 10/20/15; we are able to start [facility] to these extensive wound areas left greater than right on Tuesday after I spoke to the wound care nurse at the facility. He arrives here today for extensive surgical debridement for the wounds on the lateral aspect of his left leg posterior left leg, this is almost circumferential. He has a large wound on the medial aspect of his right leg although he finds this too painful for debridement 10/27/15; I continue to bring this patient back for frequent debridement/weekly debridement severe. We have been using Hydrofera Blue. Unfortunately this has not really had any improvement. The debridement surgery difficult and painful for the patient 11/23/15; this patient spent a complex hospitalization admitted with acute kidney injury, anemia, cellulitis of the lower extremity, he was felt to have sepsis pathophysiology although his blood cultures were negative. He had plain x-rays of both legs that were negative for osseous abnormalities. He was seen by infectious disease and placed on a workup for vasculitis that was negative including a biopsy 4 all were consistent with stasis dermatitis. Vital broad- spectrum antibiotics he continued to spike fevers. Urine and chest x-ray were negative Dopplers on admission ruled out a DVT All antibiotics were stopped on . 2/17. Since his return to [facility] skilled nursing facility I believe they have been using Xeroform 12/07/15 the patient arrives today with the area on his right medial leg actually looking quite stable. No debridement. The rest of his extensive wounds on the left lateral left posterior extending into the left medial leg all required extensive debridement. I think this is probably going to need to and up in the hands of plastic surgery. We'll attempt to change  him back to [facility]. Arrange consultation with plastic surgery at [facility] for this almost circumferential wound on the left side 12/21/15 right leg covered in surface slough. This was debridement. Left leg extensive wounds all carefully examined. This is almost circumferential on the left side especially on the posterior calf. No debridement is necessary. 01/04/16 the patient has been to [facility] plastic surgery. Unfortunately it doesn't look like they had any of the prior workup on this patient. They're in the middle of vascular workup. It sounds as though they're applying Hydrofera Blue at the nursing home. 01/22/16; the patient has been back to see [facility]. There apparently making plans to possibly do skin grafts over his large venous insufficiency ulcerations. The patient tells me that he goes to hematology in [facility] and variably uses the term platelets red cells and the description of his problem. 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 I do not see any major difference in the wound on the right medial leg and circumferentially across the left medial to left lateral leg. Did not attempt to debride these today. Readmission: 05/05/18 on evaluation today patient presents for reevaluation here in our clinic although I have previously seen him in the skilled nursing facility over the past year and a half when I was working in the skilled nursing facility realm instead of covering in the clinic. With that being said during that time we  had initiated most recently dressings with Hydrofera Blue Dressing along with the [employer] which had done very well for him. Much better than the Kerlex Coban wraps. With that being said the patient had been doing fairly well and was making progress when I last saw him. Upon evaluation today it appears that the wounds are actually a little bit worse than when I last saw him although definitely not dramatically  so. There does not appear to be any evidence of infection which is good news. With that being said he has been tolerating the wraps without complication his left hip still gives him a lot of trouble which is his main issue as far as movement is concerned. Unbeknownst to me he has not had venous studies that I can find. He previously told me he had there were arterial studies noted back from 04/27/15 which showed that he had try phasic blood flow throughout and again his test appear to be completely normal as performed by Dr. Gomez Cleverly. With that being said I cannot find where he had venous studies. The patient still states he thinks he did these definitely had no venous intervention at this point. Upon evaluation today it does not appear to me that the patient has any evidence of cellulitis. He does have some chronic venous insufficiency which I think has led to stasis dermatitis but again this is not appearing to be infected at this point. No fevers, chills, nausea, or vomiting noted at this time. READMISSION 06/30/2018 [name], [name] ([id number]) [id number] Page 2 of 11 This is a now 67 year old man we have had in this clinic at multiple times in the past. Most recently he came in August and was seen by [facility]. It is not clear why he did not come back we asked him really did not get a straight answer. He is at [facility] skilled facility he is a man who has severe chronic venous insufficiency with lymphedema and has had severe wounds on his left greater than right leg. We had him here in 2016 and 17 ultimately referred him to [facility]. He had arterial studies and venous studies done at [facility] although I am not able to access these records I see they are actually done. He also had multiple biopsies that were negative for malignancy showing changes compatible with chronic inflammation. As I understand things in [place] they are using Iodoflex and Unna boots. I  am not really sure how they are getting enough Iodoflex for the large wound area especially on his left calf. Nevertheless the wounds look better than when I saw this in 2017. There were plans for him to have plastic surgery in 2018 and I think he was actually prepped for surgery however he was ultimately denied because the patient was an active smoker. The patient is not systemically unwell. The wounds are painful however given the size especially on the left this is not surprising. ABIs in this clinic were 0.78 on the left and 0.91 on the right 07/13/2018; this is a patient with bilateral severe venous ulcers with secondary lymphedema. The wounds are on the right medial calf and a substantial part of the left posterior calf and some involvement medially and laterally on the left. We changed him to silver alginate under Unna boot's last time. The wounds actually look fairly satisfactory today. 08/14/2018; this is a patient with severe bilateral venous stasis ulcers with secondary lymphedema left greater than right he is at [facility] using  silver alginate under Unna boot's I do not think there is much change on this visit versus last time I saw him a month ago Readmission: 11/04/18 patient presents today for follow-up concerning his bilateral lower extremity ulcers. His a right medial ankle ulcer and a left leg that has ulcers over a large proportion of the surface area between the ankle and knee. Unfortunately this does cause some discomfort for the patient although it doesn't seem to be as uncomfortable as it has been in the past. He is seen today for evaluation after a referral by [place] back to the clinic. Unfortunately has a lot of Slough noted on the surface of his wounds. He's been treated currently it sounds like with Dakin's soaked gauze and they have not been performing the Unna Boot wrap that was previously recommended. I'm not exactly sure what the reason for the change was. He does have  less slough on the surface of the wound but again this is something that is often a constant issue for him. Again he's had these wounds for many years. I've known him for two years at least during that time when I was taking care of them in the facility he is [name] had the wounds for several years prior. READMISSION 02/25/2020 [name] is a now 67 year old man. He has been in our clinic several times before most extensively in October 2016 through May 2017. At that time he had absolutely substantial bilateral lower extremity wounds secondary to chronic venous insufficiency and lymphedema. We ultimately referred him to [facility] he had a series of biopsies only showed suggestion of wound secondary to chronic venous insufficiency. He had a less extensive stay in our clinic in 2019 and then a single visit in February 2020. He is a resident at [facility] skilled facility. I think he has had intermittently had in facility wound care. He returns today. They are using silver alginate on the wounds although I am not sure what type of compression. Previously he has favored Unna boots. He is not felt to have an arterial issue. Past medical history; lymphedema and chronic venous insufficiency, diabetes mellitus, hypertension, congestive heart failure We did not do arterial studies today 04/27/2020 on evaluation patient appears to be doing really about the same as when I have known him and seen him in the past. He does have wounds on the right and left legs at this point. Fortunately there is no signs of active infection at this time. 9/2; 1 month follow-up. This is a man with severe chronic venous insufficiency and lymphedema. When I first met him he had horrible circumferential bilateral lower extremity ulcers. We have been using silver alginate up until early last month when it was changed to [facility] and Unna boot's more or less says maintenance dressings. Since the last time he was here the facility  where he resides [facility] called to report they could no longer do Unna boot so he is apparently only been receiving kerlix Coban the left leg is certainly a lot worse with almost circumferential large wounds especially lateral but now medial and posterior as well. He has a standard same looking wound on the right medial lower leg. 10/1; absolutely no improvement here. In fact I do not think anything much is changed. The substantial area on his left lateral and left posterior calf is still open may be slightly larger. He has a more modest wound on the right medial calf. None of these look any different. He  comes in with a kerlix Coban wrap this will simply not be adequate. He has too much edema in this leg He also is complaining a lot of pain in his left heel area. 11/8; potential wounds on the left lateral and posterior calf and a smaller oval wound in the right medial. We have been using Hydrofera Blue under 4-layer compression. He was using Unna boots still 2 months ago the facility called to say they can no longer place these so we have been using 4-layer compression. The surface area of the area on the left is so large there is very few alternatives to what we can use on this either silver alginate or Hydrofera Blue. He was complaining of pain on the left heel last time I asked for an x-ray this was apparently done although we do not have the result. He is not complaining of pain today. 08/16/2020 on evaluation today patient is is seen for a early visit due to the fact that he apparently is having issues I was told when they called on Monday with a MRSA infection that they felt like we needed to see him for because his legs "looked horrible". With that being said based on what I see at this point it does not appear that the patient is actually having a terrible infection in fact compared to last time I saw him his legs do not appear to be doing too poorly at all at this time. Nonetheless he  has been on doxycycline that is not going to be a good option for him based on the culture report that I have for review today which graded as a partial report with still several organisms pending as far as identification is concerned we did contact them but again they do not have the final report yet. Either way based on what we see it appears that doxycycline is one of the few medications that will not work for the Citrobacter. Obviously I think that a different medication would be a good option for him since there is a gram-negative organism pending I am likely can I suggest Cipro as the best of backup antibiotic to switch him to at this point. All this was discussed with the patient today. 12/20; not much change in the substantial wound on the left posterior calf and the small area on the right medial. He has a new area on the left anterior which looks like a wrap injury. Had some thoughts about doing a deep tissue culture here although we will put that off for next time. Using silver alginate as a primary dressing 2/10; substantial area on the left posterior calf. This measures smaller but still is a very large area. He has the area on the right anterior medial which also is a fairly large wound but much smaller than not on the left. His edema control is quite good. We have been using silver alginate because of the size of the wounds. 4/7; substantial wound on the left posterior calf and a smaller area on the right medial lower leg. These are very chronic wounds which we have classified is venous. Previously worked up at [facility] for 5 years ago at which time I had sent and will send him over for consideration of extensive skin graft I know they biopsied this many times but he never had plastic surgery. The wound area is much too large for standard size dressings we have been using silver alginate but we did give him a good trial  last fall of Hydrofera Blue that did not make any difference either.  If anything the area on the right medial lower leg over time has gotten a lot smaller as has the area on the left although it still quite substantial now. Readmission 10/08/2021 Mr. [name] is a 67 year old male with a past medical history of insulin-dependent type 2 diabetes with last hemoglobin A1c of 10.2, chronic venous insufficiency, lymphedema and chronic diastolic heart failure that presents to the clinic for bilateral lower extremity wounds. He has been followed in our clinic previously for the past 6 years for these issues. He was last seen in April 2022. He is inconsistent with his appointments in our clinic. These wounds have not healed over the past few years. He is currently using silver alginate to the wound beds and keeping these covered with Kerlix. He currently denies systemic signs of infection. He was recently hospitalized for septic shock secondary to bacteremia from likely chronic leg wounds And is currently on oral antibiotics to be completed this week. 1/30; patient presents for follow-up. He has no issues or complaints today. [name], [name] ([id number]) [id number] Page 3 of 11 2/6; patient presents for follow-up. He reports more discomfort with the increase in compression wraps. He currently denies systemic signs of infection and feels well overall. 2/13; patient presents for follow-up. He reports tolerating the current leg/Coban wrap well. He denies systemic signs of infection and feels fine overall. 2/21; patient presents for follow-up. He has no issues or complaints today. He tolerated the kirlex/Coban wrap. He denies systemic signs of infection. 3/21; patient presents for follow-up. He has no issues or complaints today. He saw infectious disease, Dr. Orvan Falconer. Nothing further to do from an infectious disease point. Levaquin was stopped. He currently denies systemic signs of infection. 4/4; patient presents for follow-up. His facility was able  to obtain the [facility] antibiotic and has started using this with dressing changes/wrap changes. Patient has no issues or complaints today. He denies signs of infection. 4/18; patient presents for follow-up. He has no issues or complaints today. He denies signs of infection. 5/9; patient presents for follow-up. He has been using [facility]stone antibiotic under Kerlix/Coban. He denies signs of infection today. 6/6; monthly follow-up. Patient is at [facility] skilled facility. He has been using Keystone probably silver alginate under kerlix Coban. We use Sorbact here. Wound condition has improved 7/11; patient presents for follow-up. We have been using Keystone with Sorbact under Kerlix/Coban. Patient has no issues or complaints today. 8/8; patient presents for follow-up. He is still using Keystone and Sorbact under Kerlix/Coban to his lower extremities bilaterally. He has no issues or complaints today. He denies signs of infection. 9/12; patient presents for follow-up. We have been using Keystone with Sorbact and calcium alginate under Kerlix/Coban to lower extremities bilaterally. He has no issues or complaints today. 10/10; patient presents for follow-up. We continue to use [facility] with Sorbact and calcium alginate under Kerlix/Coban. He has tolerated this well and he has no issues or complaints today. 11/14; patient presents for follow-up. We have been using Keystone with Sorbact and calcium alginate under Kerlix/Coban to the lower extremities bilaterally. He cannot tolerate higher compression. He states that the facility he resides and changes his dressings 3 times a week. He has no issues or complaints today. 12/12; patient presents for follow-up. We have been using Keystone antibiotic ointment with Sorbact and calcium alginate under Kerlix/Coban to the lower extremities bilaterally. He is open to trying a higher level compression.  His ABIs do not suggest significant arterial insufficiency. 1/8;  patient presents for follow up. We have been using Keystone antibiotic ointment with Sorbact and calcium alginate. We increased the compression to 3 layer compression last visit however patient came in with Kerlix/Coban and no sorbact. It is unclear if the patient's facility has been using a higher level compression. We will follow-up with his facility. 12/17/2022 Patient has not been back since January. Patient has bilateral lower extremity wounds. These are the same wounds we have been treating. Facility has been using silver alginate under compression. Patient has no issues or complaints today. 5/7; this is a patient who lives in [place] skilled facility. We have been following this man for years with severe bilateral lower extremity ulcers/venous wounds. When he first came to the clinic he had multiple biopsies but they were never particularly diagnostic of any other etiology. The wound area especially on the left calf area is particularly large not well covered by anything that is skilled facility would have except silver alginate. We are using kerlix Coban compression bilaterally. There has been some improvement in the right medial lower leg. There is a fairly large amount of nonviable debris especially on the left 6/18; patient presents for follow-up. He is at [facility] facility and states that the dressings have not been changed in over a week. He does have mild odor on exam. He denies systemic signs of infection. We have been using silver alginate under Kerlix/Coban to the lower extremities bilaterally 6/25; patient presents for follow-up. We have been using Dakin's under compression wrap. His wounds have improved greatly. I did a wound culture at last clinic visit that grew over 13 different bacteria strains with the most prevalent being E. coli and Pseudomonas aeruginosa. Keystone antibiotic spray was ordered and he should be receiving this in the next week. Patient has no issues or  complaints today. He denies signs of infection. 7/23; Patient presents for follow up. He has been using keystone antibiotic spray to the wound beds. Today there is no silver alginate on the wound beds or kerlix/coban compressin wrap. He resides in a facility that changes the dressings several times a week. 9/10; patient presents for follow-up. We have been using Keystone antibiotic ointment with silver alginate under Kerlix/Coban to the lower extremities bilaterally. Wounds have healthier granulation tissue and appear well-healing. He has no issues or complaints. He denies signs of infection. 10/22; this patient that is been in this clinic for a long period of time. He is also a long-term resident at [facility] skilled facility. He has severe bilateral lymphedema secondary to chronic venous insufficiency. At 1 point the area on his left lower leg was circumferential extending to a large wound on the right medial and posterior calf. He was completely worked up for this at [facility] including biopsies, lab work etc. no additional diagnoses were found as I recall. We are using silver alginate at the facility with [facility] change 3 times a week. He did not come in with the [facility] today 11/18; 1 month follow-up. Wounds appear better using Keystone silver alginate. He does not come into the clinic with [facility] therefore we use topical gentamicin and silver alginate. Electronic Signature(s) Signed: 08/11/2023 4:31:26 PM By: Baltazar Najjar MD Entered By: Baltazar Najjar on 08/11/2023 11:40:31 -------------------------------------------------------------------------------- Physical Exam Details [name], [name] 08/11/2023 2:15 PM Medical Record Number: [id number] Patient Account Number: [id number] Sentell, Izaia ([id number]) [id number] Page 4 of 11  Date of Birth/Sex: Treating RN: 1956/08/10 (67 y.o. M) Primary Care Provider: Other  Clinician: Renford [name] Referring Provider: Treating Provider/Extender: [name] in Treatment: 33 Constitutional Sitting or standing Blood Pressure is within target range for patient.. Pulse regular and within target range for patient.Marland Kitchen Respirations regular, non-labored and within target range.. Temperature is normal and within the target range for the patient.Marland Kitchen Appears in no distress. Notes Wound exam; bilateral lower extremity wounds left posterior right medial lower legs. This is lymphedema secondary to chronic venous insufficiency. We have been using pillows Keystone and silver alginate. No evidence of infection Electronic Signature(s) Signed: 08/11/2023 4:31:26 PM By: Baltazar Najjar MD Entered By: Baltazar Najjar on 08/11/2023 11:42:18 -------------------------------------------------------------------------------- Physician Orders Details [name], [name] 08/11/2023 2:15 PM Medical Record Number: [id number] Patient Account Number: [id number] Date of Birth/Sex: Treating RN: 02/05/1956 (67 y.o. Harlon Flor, Millard.Loa Primary Care Provider: Renford [name] Other Clinician: Referring Provider: Treating Provider/Extender: [name] in Treatment: 33 The following information was scribed by: Shawn Stall The information was scribed for: Baltazar Najjar Verbal / Phone Orders: No Diagnosis Coding ICD-10 Coding Code Description I87.333 Chronic venous hypertension (idiopathic) with ulcer and inflammation of bilateral lower extremity 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ed L97.912 Non-pressure chronic ulcer of unspecified part of right lower leg with fat layer exposed I89.0 Lymphedema, not elsewhere classified E44.0 Moderate protein-calorie malnutrition E11.622 Type 2 diabetes mellitus with other skin ulcer I50.32 Chronic diastolic (congestive) heart  failure Follow-up Appointments ppointment in: - Dr. Leanord Hawking 2 months *** **** Return A Other: - [facility] *********PLEASE WRAP BOTH LEGS WITH KERLIX AND COBAN FROM BASE OF TOES TO JUST BELOW KNEE FOR COMPRESSION.****** Next dressing change should be Thursday at the assisted living facility. Anesthetic (In clinic) Topical Lidocaine 4% applied to wound bed Bathing/ Shower/ Hygiene May shower with protection but do not get wound dressing(s) wet. Protect dressing(s) with water repellant cover (for example, large plastic bag) or a cast cover and may then take shower. Non Wound Condition Other Non Wound Condition Orders/Instructions: - in clinic only applied gentamicin and bactroban mixed and applied to wound bed. Wound Treatment Wound #30 - Lower Leg Wound Laterality: Left, Posterior Cleanser: Soap and Water 3 x Per Week/30 Days Discharge Instructions: May shower and wash wound with dial antibacterial soap and water prior to dressing change. Topical: Triamcinolone 3 x Per Week/30 Days Discharge Instructions: Apply Triamcinolone as directed [name], [name] ([id number]) [id number] Page 5 of 11 Topical: topical compounding antibiotics from [facility] 3 x Per Week/30 Days Discharge Instructions: Spray then apply calcium alginate Ag. Prim Dressing: Maxorb Extra Ag+ Alginate Dressing, 4x4.75 (in/in) 3 x Per Week/30 Days ary Discharge Instructions: Apply over the antibiotic spray ****once the topical compounding antibiotics arrive. Secondary Dressing: ABD Pad, 8x10 3 x Per Week/30 Days Discharge Instructions: Apply over primary dressing as directed. Secured With: [employer], 4.5x3.1 (in/yd) 3 x Per Week/30 Days Discharge Instructions: Secure with Kerlix as directed. Compression Wrap: Kerlix Roll 4.5x3.1 (in/yd) 3 x Per Week/30 Days Discharge Instructions: Apply Kerlix and Coban compression as directed. Compression Wrap: Coban Self-Adherent  Wrap 4x5 (in/yd) 3 x Per Week/30 Days Discharge Instructions: Apply over Kerlix as directed. Wound #31 - Lower Leg Wound Laterality: Right, Medial Cleanser: Soap and Water Every Other Day/30 Days Discharge Instructions: May shower and wash wound with dial antibacterial soap and water prior to dressing change. Peri-Wound  Care: Triamcinolone 15 (g) Every Other Day/30 Days Discharge Instructions: Use triamcinolone 15 (g) as directed Topical: topical compounding antibiotics from [facility] Every Other Day/30 Days Discharge Instructions: Spray then apply calcium alginate Ag. Prim Dressing: Maxorb Extra Ag+ Alginate Dressing, 4x4.75 (in/in) Every Other Day/30 Days ary Discharge Instructions: Apply over the antibiotic spray ****once the topical compounding antibiotics arrive. Secondary Dressing: ABD Pad, 8x10 Every Other Day/30 Days Discharge Instructions: Apply over primary dressing as directed. Secured With: [employer], 4.5x3.1 (in/yd) Every Other Day/30 Days Discharge Instructions: Secure with Kerlix as directed. Compression Wrap: Kerlix Roll 4.5x3.1 (in/yd) Every Other Day/30 Days Discharge Instructions: Apply Kerlix and Coban compression as directed. Compression Wrap: Coban Self-Adherent Wrap 4x5 (in/yd) Every Other Day/30 Days Discharge Instructions: Apply over Kerlix as directed. Electronic Signature(s) Signed: 08/11/2023 4:31:26 PM By: Baltazar Najjar MD Signed: 08/11/2023 5:09:00 PM By: Shawn Stall RN, BSN Entered By: Shawn Stall on 08/11/2023 11:30:46 -------------------------------------------------------------------------------- Problem List Details [name], [name] 08/11/2023 2:15 PM Medical Record Number: [id number] Patient Account Number: [id number] Date of Birth/Sex: Treating RN: [birth year] (67 y.o. [name] Primary Care Provider: Renford [name] Other Clinician: Referring Provider: Treating Provider/Extender: [name] in Treatment: 33 Active Problems ICD-10 Encounter Code Description Active Date MDM Diagnosis I87.333 Chronic venous hypertension (idiopathic) with ulcer and inflammation of 12/17/2022 No Yes bilateral lower extremity Presswood, Colby ([id number]) [id number] Page 6 of 11 [phone] Non-pressure chronic ulcer of other part of left lower leg with fat layer exposed3/26/2024 No Yes L97.912 Non-pressure chronic ulcer of unspecified part of right lower leg with fat layer 12/17/2022 No Yes exposed I89.0 Lymphedema, not elsewhere classified 12/17/2022 No Yes E44.0 Moderate protein-calorie malnutrition 12/17/2022 No Yes E11.622 Type 2 diabetes mellitus with other skin ulcer 12/17/2022 No Yes I50.32 Chronic diastolic (congestive) heart failure 12/17/2022 No Yes Inactive Problems Resolved Problems Electronic Signature(s) Signed: 08/11/2023 4:31:26 PM By: Baltazar Najjar MD Entered By: Baltazar Najjar on 08/11/2023 11:39:28 -------------------------------------------------------------------------------- Progress Note Details [name], [name] 08/11/2023 2:15 PM Medical Record Number: [id number] Patient Account Number: [id number] Date of Birth/Sex: Treating RN: 1955-10-29 (68 y.o. M) Primary Care Provider: Renford [name] Other Clinician: Referring Provider: Treating Provider/Extender: [name] in Treatment: 33 Subjective History of Present Illness (HPI) 07/07/15; this is a patient who is in hospital on 8/2 through 8/4. He had cellulitis and abscess of predominantly I think the left leg. He received IV antibiotics. Plain x-ray showed no osteomyelitis. An MRI of the left leg did not show osteomyelitis. Cultures showed no predominant organism. His hemoglobin A1c was 9.8. He has a history of venous stasis also peripheral vascular disease. He was discharged to [facility] nursing home. He really has  extensive ulcerations on the left lateral leg including a major wound that communicates both posteriorly and superiorly w. When drainage coming out of 2 smaller areas. He has a smaller wound on the posterior medial left leg. He has more predominantly venous insufficiency wounds on predominantly the right medial leg above the ankle the right foot into sections. He has recently been put on Augmentin at the nursing home. Previous ABIs/arterial evaluation showed triphasic waves diffusely he does not have a major ischemic issue a left lower extremity venous duplex also exam showed no evidence of a DVT on 6/21 07/21/15; the patient arrives with really no major change. Culture I did last time was negative. He has not had a follow-up MRI I  ordered. The wounds are macerated covered with a thick gelatinous surface slough. There is necrotic subcutaneous tissue 08/04/15; the patient arrives with a considerable improvement in the majority of his wound area. The area on the left leg now has what looks to be a granulated base. Most of the wounds on the left leg required an aggressive surgical debridement to remove nonviable fibrinous eschar and subcutaneous tissue however after debridement most of this looks better. Although I had asked for repeat MRI of the left leg when he first came in here with grossly purulent material coming out of his wounds, it does not appear that this is been done and nor do I actually feel that strongly about it right now. He has severe surrounding venous insufficiency and inflammation. I don't believe he has significant PAD 08/25/15. In general there is still a considerable wound area here but with still extensive service adherent slough. The patient will not allow mechanical debridement due to pain. He did not tolerate Medihoney therefore we are left with Santyl for now. She would appear that he has severe surrounding venous stasis. There is no evidence of the infection that may have had  something to do with the pathogenesis of these wounds 09/29/15; patient still has substantial wound area on the left leg with a cluster of several wounds on the lateral leg confluently on the left leg posteriorly and then a substantial wound on the medial leg. On the right leg a substantial wound medially and a small area on the right lateral foot. All of these underwent a substantial surgical debridement with curettes which she tolerated better than he has in the past. This started as a complex cellulitis in the face of chronic venous insufficiency and inflammation. 10/13/15; substantial cluster of wounds on the lateral aspect of his left leg confluently around to the other side. Extensive surgical debridement to remove for redness surface slough nonviable subcutaneous tissue. This is not an improvement. Also substantial wound on the medial right leg which is largely unchanged. The etiology of this was felt to be a complex cellulitis in the summer of 2016 in the face of chronic venous insufficiency and inflammation. The patient states [name], [name] ([id number]) [id number] Page 7 of 11 that the wound on the right medial leg has been there for years off and on. 10/20/15; we are able to start [facility] to these extensive wound areas left greater than right on Tuesday after I spoke to the wound care nurse at the facility. He arrives here today for extensive surgical debridement for the wounds on the lateral aspect of his left leg posterior left leg, this is almost circumferential. He has a large wound on the medial aspect of his right leg although he finds this too painful for debridement 10/27/15; I continue to bring this patient back for frequent debridement/weekly debridement severe. We have been using Hydrofera Blue. Unfortunately this has not really had any improvement. The debridement surgery difficult and painful for the patient 11/23/15; this patient spent a complex  hospitalization admitted with acute kidney injury, anemia, cellulitis of the lower extremity, he was felt to have sepsis pathophysiology although his blood cultures were negative. He had plain x-rays of both legs that were negative for osseous abnormalities. He was seen by infectious disease and placed on a workup for vasculitis that was negative including a biopsy 4 all were consistent with stasis dermatitis. Vital broad-spectrum antibiotics he continued to spike fevers. Urine and chest x-ray were negative Dopplers on admission ruled  out a DVT All antibiotics were stopped on 2/17. . Since his return to [facility] skilled nursing facility I believe they have been using Xeroform 12/07/15 the patient arrives today with the area on his right medial leg actually looking quite stable. No debridement. The rest of his extensive wounds on the left lateral left posterior extending into the left medial leg all required extensive debridement. I think this is probably going to need to and up in the hands of plastic surgery. We'll attempt to change him back to [facility]. Arrange consultation with plastic surgery at [facility] for this almost circumferential wound on the left side 12/21/15 right leg covered in surface slough. This was debridement. Left leg extensive wounds all carefully examined. This is almost circumferential on the left side especially on the posterior calf. No debridement is necessary. 01/04/16 the patient has been to [facility] plastic surgery. Unfortunately it doesn't look like they had any of the prior workup on this patient. They're in the middle of vascular workup. It sounds as though they're applying Hydrofera Blue at the nursing home. 01/22/16; the patient has been back to see [facility]. There apparently making plans to possibly do skin grafts over his large venous insufficiency ulcerations. The patient tells me that he goes to hematology in [facility] and variably uses the term  platelets red cells and the description of his problem. 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 I do not see any major difference in the wound on the right medial leg and circumferentially across the left medial to left lateral leg. Did not attempt to debride these today. Readmission: 05/05/18 on evaluation today patient presents for reevaluation here in our clinic although I have previously seen him in the skilled nursing facility over the past year and a half when I was working in the skilled nursing facility realm instead of covering in the clinic. With that being said during that time we had initiated most recently dressings with Hydrofera Blue Dressing along with the [employer] which had done very well for him. Much better than the Kerlex Coban wraps. With that being said the patient had been doing fairly well and was making progress when I last saw him. Upon evaluation today it appears that the wounds are actually a little bit worse than when I last saw him although definitely not dramatically so. There does not appear to be any evidence of infection which is good news. With that being said he has been tolerating the wraps without complication his left hip still gives him a lot of trouble which is his main issue as far as movement is concerned. Unbeknownst to me he has not had venous studies that I can find. He previously told me he had there were arterial studies noted back from 04/27/15 which showed that he had try phasic blood flow throughout and again his test appear to be completely normal as performed by Dr. Gomez Cleverly. With that being said I cannot find where he had venous studies. The patient still states he thinks he did these definitely had no venous intervention at this point. Upon evaluation today it does not appear to me that the patient has any evidence of cellulitis. He does have some chronic venous insufficiency which I think has led to  stasis dermatitis but again this is not appearing to be infected at this point. No fevers, chills, nausea, or vomiting noted at this time. READMISSION 06/30/2018 This is  a now 67 year old man we have had in this clinic at multiple times in the past. Most recently he came in August and was seen by [facility] stone. It is not clear why he did not come back we asked him really did not get a straight answer. He is at [facility] skilled facility he is a man who has severe chronic venous insufficiency with lymphedema and has had severe wounds on his left greater than right leg. We had him here in 2016 and 17 ultimately referred him to [facility]. He had arterial studies and venous studies done at [facility] although I am not able to access these records I see they are actually done. He also had multiple biopsies that were negative for malignancy showing changes compatible with chronic inflammation. As I understand things in [place] they are using Iodoflex and Unna boots. I am not really sure how they are getting enough Iodoflex for the large wound area especially on his left calf. Nevertheless the wounds look better than when I saw this in 2017. There were plans for him to have plastic surgery in 2018 and I think he was actually prepped for surgery however he was ultimately denied because the patient was an active smoker. The patient is not systemically unwell. The wounds are painful however given the size especially on the left this is not surprising. ABIs in this clinic were 0.78 on the left and 0.91 on the right 07/13/2018; this is a patient with bilateral severe venous ulcers with secondary lymphedema. The wounds are on the right medial calf and a substantial part of the left posterior calf and some involvement medially and laterally on the left. We changed him to silver alginate under Unna boot's last time. The wounds actually look fairly satisfactory today. 08/14/2018; this is a patient with severe  bilateral venous stasis ulcers with secondary lymphedema left greater than right he is at [facility] using silver alginate under Unna boot's I do not think there is much change on this visit versus last time I saw him a month ago Readmission: 11/04/18 patient presents today for follow-up concerning his bilateral lower extremity ulcers. His a right medial ankle ulcer and a left leg that has ulcers over a large proportion of the surface area between the ankle and knee. Unfortunately this does cause some discomfort for the patient although it doesn't seem to be as uncomfortable as it has been in the past. He is seen today for evaluation after a referral by [place] back to the clinic. Unfortunately has a lot of Slough noted on the surface of his wounds. He's been treated currently it sounds like with Dakin's soaked gauze and they have not been performing the Unna Boot wrap that was previously recommended. I'm not exactly sure what the reason for the change was. He does have less slough on the surface of the wound but again this is something that is often a constant issue for him. Again he's had these wounds for many years. I've known him for two years at least during that time when I was taking care of them in the facility he is [name] had the wounds for several years prior. READMISSION 02/25/2020 [name] is a now 67 year old man. He has been in our clinic several times before most extensively in October 2016 through May 2017. At that time he had absolutely substantial bilateral lower extremity wounds secondary to chronic venous insufficiency and lymphedema. We ultimately referred him to [facility] he had a series  of biopsies only showed suggestion of wound secondary to chronic venous insufficiency. He had a less extensive stay in our clinic in 2019 and then a single visit in February 2020. He is a resident at [facility] skilled facility. I think he has had intermittently had in facility wound care. He  returns today. They are using silver alginate on the wounds although I am not sure what type of compression. Previously he has favored Unna boots. He is not felt to have an arterial issue. Past medical history; lymphedema and chronic venous insufficiency, diabetes mellitus, hypertension, congestive heart failure We did not do arterial studies today 04/27/2020 on evaluation patient appears to be doing really about the same as when I have known him and seen him in the past. He does have wounds on the right and left legs at this point. Fortunately there is no signs of active infection at this time. 9/2; 1 month follow-up. This is a man with severe chronic venous insufficiency and lymphedema. When I first met him he had horrible circumferential bilateral lower extremity ulcers. We have been using silver alginate up until early last month when it was changed to [facility] and Unna boot's more or less says maintenance dressings. Since the last time he was here the facility where he resides [facility] called to report they could no longer do Unna boot so he is apparently only been receiving kerlix Coban the left leg is certainly a lot worse with almost circumferential large wounds especially lateral but now medial and posterior as well. He has a standard same looking wound on the right medial lower leg. 10/1; absolutely no improvement here. In fact I do not think anything much is changed. The substantial area on his left lateral and left posterior calf is still [name], [name] ([id number]) [id number] Page 8 of 11 open may be slightly larger. He has a more modest wound on the right medial calf. None of these look any different. He comes in with a kerlix Coban wrap this will simply not be adequate. He has too much edema in this leg He also is complaining a lot of pain in his left heel area. 11/8; potential wounds on the left lateral and posterior calf and a smaller oval wound in  the right medial. We have been using Hydrofera Blue under 4-layer compression. He was using Unna boots still 2 months ago the facility called to say they can no longer place these so we have been using 4-layer compression. The surface area of the area on the left is so large there is very few alternatives to what we can use on this either silver alginate or Hydrofera Blue. He was complaining of pain on the left heel last time I asked for an x-ray this was apparently done although we do not have the result. He is not complaining of pain today. 08/16/2020 on evaluation today patient is is seen for a early visit due to the fact that he apparently is having issues I was told when they called on Monday with a MRSA infection that they felt like we needed to see him for because his legs "looked horrible". With that being said based on what I see at this point it does not appear that the patient is actually having a terrible infection in fact compared to last time I saw him his legs do not appear to be doing too poorly at all at this time. Nonetheless he has been on doxycycline that is not going  to be a good option for him based on the culture report that I have for review today which graded as a partial report with still several organisms pending as far as identification is concerned we did contact them but again they do not have the final report yet. Either way based on what we see it appears that doxycycline is one of the few medications that will not work for the Citrobacter. Obviously I think that a different medication would be a good option for him since there is a gram-negative organism pending I am likely can I suggest Cipro as the best of backup antibiotic to switch him to at this point. All this was discussed with the patient today. 12/20; not much change in the substantial wound on the left posterior calf and the small area on the right medial. He has a new area on the left anterior which looks  like a wrap injury. Had some thoughts about doing a deep tissue culture here although we will put that off for next time. Using silver alginate as a primary dressing 2/10; substantial area on the left posterior calf. This measures smaller but still is a very large area. He has the area on the right anterior medial which also is a fairly large wound but much smaller than not on the left. His edema control is quite good. We have been using silver alginate because of the size of the wounds. 4/7; substantial wound on the left posterior calf and a smaller area on the right medial lower leg. These are very chronic wounds which we have classified is venous. Previously worked up at [facility] for 5 years ago at which time I had sent and will send him over for consideration of extensive skin graft I know they biopsied this many times but he never had plastic surgery. The wound area is much too large for standard size dressings we have been using silver alginate but we did give him a good trial last fall of Hydrofera Blue that did not make any difference either. If anything the area on the right medial lower leg over time has gotten a lot smaller as has the area on the left although it still quite substantial now. Readmission 10/08/2021 Mr. [name] is a 67 year old male with a past medical history of insulin-dependent type 2 diabetes with last hemoglobin A1c of 10.2, chronic venous insufficiency, lymphedema and chronic diastolic heart failure that presents to the clinic for bilateral lower extremity wounds. He has been followed in our clinic previously for the past 6 years for these issues. He was last seen in April 2022. He is inconsistent with his appointments in our clinic. These wounds have not healed over the past few years. He is currently using silver alginate to the wound beds and keeping these covered with Kerlix. He currently denies systemic signs of infection. He was recently hospitalized for septic  shock secondary to bacteremia from likely chronic leg wounds And is currently on oral antibiotics to be completed this week. 1/30; patient presents for follow-up. He has no issues or complaints today. 2/6; patient presents for follow-up. He reports more discomfort with the increase in compression wraps. He currently denies systemic signs of infection and feels well overall. 2/13; patient presents for follow-up. He reports tolerating the current leg/Coban wrap well. He denies systemic signs of infection and feels fine overall. 2/21; patient presents for follow-up. He has no issues or complaints today. He tolerated the kirlex/Coban wrap. He denies systemic signs of  infection. 3/21; patient presents for follow-up. He has no issues or complaints today. He saw infectious disease, Dr. Orvan Falconer. Nothing further to do from an infectious disease point. Levaquin was stopped. He currently denies systemic signs of infection. 4/4; patient presents for follow-up. His facility was able to obtain the [facility] antibiotic and has started using this with dressing changes/wrap changes. Patient has no issues or complaints today. He denies signs of infection. 4/18; patient presents for follow-up. He has no issues or complaints today. He denies signs of infection. 5/9; patient presents for follow-up. He has been using Keystone antibiotic under Kerlix/Coban. He denies signs of infection today. 6/6; monthly follow-up. Patient is at [facility] skilled facility. He has been using Keystone probably silver alginate under kerlix Coban. We use Sorbact here. Wound condition has improved 7/11; patient presents for follow-up. We have been using Keystone with Sorbact under Kerlix/Coban. Patient has no issues or complaints today. 8/8; patient presents for follow-up. He is still using Keystone and Sorbact under Kerlix/Coban to his lower extremities bilaterally. He has no issues or complaints today. He denies signs of infection. 9/12;  patient presents for follow-up. We have been using Keystone with Sorbact and calcium alginate under Kerlix/Coban to lower extremities bilaterally. He has no issues or complaints today. 10/10; patient presents for follow-up. We continue to use [facility] with Sorbact and calcium alginate under Kerlix/Coban. He has tolerated this well and he has no issues or complaints today. 11/14; patient presents for follow-up. We have been using Keystone with Sorbact and calcium alginate under Kerlix/Coban to the lower extremities bilaterally. He cannot tolerate higher compression. He states that the facility he resides and changes his dressings 3 times a week. He has no issues or complaints today. 12/12; patient presents for follow-up. We have been using Keystone antibiotic ointment with Sorbact and calcium alginate under Kerlix/Coban to the lower extremities bilaterally. He is open to trying a higher level compression. His ABIs do not suggest significant arterial insufficiency. 1/8; patient presents for follow up. We have been using Keystone antibiotic ointment with Sorbact and calcium alginate. We increased the compression to 3 layer compression last visit however patient came in with Kerlix/Coban and no sorbact. It is unclear if the patient's facility has been using a higher level compression. We will follow-up with his facility. 12/17/2022 Patient has not been back since January. Patient has bilateral lower extremity wounds. These are the same wounds we have been treating. Facility has been using silver alginate under compression. Patient has no issues or complaints today. 5/7; this is a patient who lives in [facility] skilled facility. We have been following this man for years with severe bilateral lower extremity ulcers/venous wounds. When he first came to the clinic he had multiple biopsies but they were never particularly diagnostic of any other etiology. The wound area especially on the left calf area is  particularly large not well covered by anything that is skilled facility would have except silver alginate. We are using kerlix Coban compression bilaterally. There has been some improvement in the right medial lower leg. There is a fairly large amount of nonviable debris especially on the left [name], [name] ([id number]) [id number] Page 9 of 11 6/18; patient presents for follow-up. He is at [facility] facility and states that the dressings have not been changed in over a week. He does have mild odor on exam. He denies systemic signs of infection. We have been using silver alginate under Kerlix/Coban to the lower extremities bilaterally 6/25; patient  presents for follow-up. We have been using Dakin's under compression wrap. His wounds have improved greatly. I did a wound culture at last clinic visit that grew over 13 different bacteria strains with the most prevalent being E. coli and Pseudomonas aeruginosa. Keystone antibiotic spray was ordered and he should be receiving this in the next week. Patient has no issues or complaints today. He denies signs of infection. 7/23; Patient presents for follow up. He has been using keystone antibiotic spray to the wound beds. Today there is no silver alginate on the wound beds or kerlix/coban compressin wrap. He resides in a facility that changes the dressings several times a week. 9/10; patient presents for follow-up. We have been using Keystone antibiotic ointment with silver alginate under Kerlix/Coban to the lower extremities bilaterally. Wounds have healthier granulation tissue and appear well-healing. He has no issues or complaints. He denies signs of infection. 10/22; this patient that is been in this clinic for a long period of time. He is also a long-term resident at [facility] skilled facility. He has severe bilateral lymphedema secondary to chronic venous insufficiency. At 1 point the area on his left lower leg was  circumferential extending to a large wound on the right medial and posterior calf. He was completely worked up for this at [facility] including biopsies, lab work etc. no additional diagnoses were found as I recall. We are using silver alginate at the facility with [facility] change 3 times a week. He did not come in with the [facility] today 11/18; 1 month follow-up. Wounds appear better using Keystone silver alginate. He does not come into the clinic with [facility] therefore we use topical gentamicin and silver alginate. Objective Constitutional Sitting or standing Blood Pressure is within target range for patient.. Pulse regular and within target range for patient.Marland Kitchen Respirations regular, non-labored and within target range.. Temperature is normal and within the target range for the patient.Marland Kitchen Appears in no distress. Vitals Time Taken: 2:04 PM, Weight: 245.6 lbs, Temperature: 98.8 F, Pulse: 76 bpm, Respiratory Rate: 18 breaths/min, Blood Pressure: 115/59 mmHg. General Notes: Wound exam; bilateral lower extremity wounds left posterior right medial lower legs. This is lymphedema secondary to chronic venous insufficiency. We have been using pillows Keystone and silver alginate. No evidence of infection Integumentary (Hair, Skin) Wound #30 status is Open. Original cause of wound was Gradually Appeared. The date acquired was: 09/23/2020. The wound has been in treatment 33 weeks. The wound is located on the Left,Posterior Lower Leg. The wound measures 13cm length x 11cm width x 0.2cm depth; 112.312cm^2 area and 22.462cm^3 volume. There is Fat Layer (Subcutaneous Tissue) exposed. There is no tunneling or undermining noted. There is a large amount of serosanguineous drainage noted. There is large (67-100%) red, friable granulation within the wound bed. There is a small (1-33%) amount of necrotic tissue within the wound bed including Adherent Slough. The periwound skin appearance exhibited: Dry/Scaly. The  periwound skin appearance did not exhibit: Callus, Crepitus, Excoriation, Induration, Rash, Scarring, Maceration, Atrophie Blanche, Cyanosis, Ecchymosis, Hemosiderin Staining, Mottled, Pallor, Rubor, Erythema. Wound #31 status is Open. Original cause of wound was Gradually Appeared. The date acquired was: 10/31/2022. The wound has been in treatment 33 weeks. The wound is located on the Right,Medial Lower Leg. The wound measures 1.5cm length x 0.5cm width x 0.2cm depth; 0.589cm^2 area and 0.118cm^3 volume. There is Fat Layer (Subcutaneous Tissue) exposed. There is no tunneling or undermining noted. There is a medium amount of serosanguineous drainage noted. The wound margin  is distinct with the outline attached to the wound base. There is large (67-100%) red granulation within the wound bed. There is a small (1-33%) amount of necrotic tissue within the wound bed including Adherent Slough. The periwound skin appearance exhibited: Dry/Scaly, Hemosiderin Staining. The periwound skin appearance did not exhibit: Callus, Crepitus, Excoriation, Induration, Rash, Scarring, Maceration, Atrophie Blanche, Cyanosis, Ecchymosis, Mottled, Pallor, Rubor, Erythema. Assessment Active Problems ICD-10 Chronic venous hypertension (idiopathic) with ulcer and inflammation of bilateral lower extremity Non-pressure chronic ulcer of other part of left lower leg with fat layer exposed Non-pressure chronic ulcer of unspecified part of right lower leg with fat layer exposed Lymphedema, not elsewhere classified Moderate protein-calorie malnutrition Type 2 diabetes mellitus with other skin ulcer Chronic diastolic (congestive) heart failure Plan Follow-up Appointments: Return Appointment in: - Dr. Leanord Hawking 2 months *** **** Other: - [facility] *********PLEASE WRAP BOTH LEGS WITH KERLIX AND COBAN FROM BASE OF TOES TO JUST BELOW KNEE FOR COMPRESSION.****** Next dressing change should be Thursday at the assisted living  facility. Anesthetic: (In clinic) Topical Lidocaine 4% applied to wound bed Bathing/ Shower/ Hygiene: Hang, Mcihael ([id number]) [id number] Page 10 of 11 May shower with protection but do not get wound dressing(s) wet. Protect dressing(s) with water repellant cover (for example, large plastic bag) or a cast cover and may then take shower. Non Wound Condition: Other Non Wound Condition Orders/Instructions: - in clinic only applied gentamicin and bactroban mixed and applied to wound bed. WOUND #30: - Lower Leg Wound Laterality: Left, Posterior Cleanser: Soap and Water 3 x Per Week/30 Days Discharge Instructions: May shower and wash wound with dial antibacterial soap and water prior to dressing change. Topical: Triamcinolone 3 x Per Week/30 Days Discharge Instructions: Apply Triamcinolone as directed Topical: topical compounding antibiotics from [facility] 3 x Per Week/30 Days Discharge Instructions: Spray then apply calcium alginate Ag. Prim Dressing: Maxorb Extra Ag+ Alginate Dressing, 4x4.75 (in/in) 3 x Per Week/30 Days ary Discharge Instructions: Apply over the antibiotic spray ****once the topical compounding antibiotics arrive. Secondary Dressing: ABD Pad, 8x10 3 x Per Week/30 Days Discharge Instructions: Apply over primary dressing as directed. Secured With: [employer], 4.5x3.1 (in/yd) 3 x Per Week/30 Days Discharge Instructions: Secure with Kerlix as directed. Com pression Wrap: Kerlix Roll 4.5x3.1 (in/yd) 3 x Per Week/30 Days Discharge Instructions: Apply Kerlix and Coban compression as directed. Com pression Wrap: Coban Self-Adherent Wrap 4x5 (in/yd) 3 x Per Week/30 Days Discharge Instructions: Apply over Kerlix as directed. WOUND #31: - Lower Leg Wound Laterality: Right, Medial Cleanser: Soap and Water Every Other Day/30 Days Discharge Instructions: May shower and wash wound with dial antibacterial soap and water prior to dressing  change. Peri-Wound Care: Triamcinolone 15 (g) Every Other Day/30 Days Discharge Instructions: Use triamcinolone 15 (g) as directed Topical: topical compounding antibiotics from [facility] Every Other Day/30 Days Discharge Instructions: Spray then apply calcium alginate Ag. Prim Dressing: Maxorb Extra Ag+ Alginate Dressing, 4x4.75 (in/in) Every Other Day/30 Days ary Discharge Instructions: Apply over the antibiotic spray ****once the topical compounding antibiotics arrive. Secondary Dressing: ABD Pad, 8x10 Every Other Day/30 Days Discharge Instructions: Apply over primary dressing as directed. Secured With: [employer], 4.5x3.1 (in/yd) Every Other Day/30 Days Discharge Instructions: Secure with Kerlix as directed. Com pression Wrap: Kerlix Roll 4.5x3.1 (in/yd) Every Other Day/30 Days Discharge Instructions: Apply Kerlix and Coban compression as directed. Com pression Wrap: Coban Self-Adherent Wrap 4x5 (in/yd) Every Other Day/30 Days Discharge Instructions: Apply over Kerlix as directed. 1. We are  continuing with Keystone silver alginate with Kerlix and Coban. 2. Making good progress 3. No evidence of infection 4. Edema control is adequate Electronic Signature(s) Signed: 08/11/2023 4:31:26 PM By: Baltazar Najjar MD Entered By: Baltazar Najjar on 08/11/2023 11:42:59 -------------------------------------------------------------------------------- SuperBill Details [name], [name] 08/11/2023 Medical Record Number: [id number] Patient Account Number: [id number] Date of Birth/Sex: Treating RN: 1956/04/04 (67 y.o. [name] Primary Care Provider: Renford [name] Other Clinician: Referring Provider: Treating Provider/Extender: [name] in Treatment: 33 Diagnosis Coding ICD-10 Codes Code Description [phone] Chronic venous hypertension (idiopathic) with ulcer and inflammation of bilateral lower extremity L97.822  Non-pressure chronic ulcer of other part of left lower leg with fat layer exposed L97.912 Non-pressure chronic ulcer of unspecified part of right lower leg with fat layer exposed I89.0 Lymphedema, not elsewhere classified E44.0 Moderate protein-calorie malnutrition E11.622 Type 2 diabetes mellitus with other skin ulcer I50.32 Chronic diastolic (congestive) heart failure [name], [name] ([id number]) [id number] Page 11 of 11 Facility Procedures : CPT4 Code: [id number] Description: [phone] - WOUND CARE VISIT-LEV 5 EST PT Modifier: Quantity: 1 Physician Procedures : CPT4 Code Description Modifier [id number] - WC PHYS LEVEL 3 - EST PT ICD-10 Diagnosis Description 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ed L97.912 Non-pressure chronic ulcer of unspecified part of  right lower leg with fat layer exposed I87.333 Chronic venous hypertension (idiopathic) with ulcer and inflammation of bilateral lower extremity Quantity: 1 Electronic Signature(s) Signed: 08/11/2023 4:31:26 PM By: Baltazar Najjar MD Entered By: Baltazar Najjar on 08/11/2023 11:43:24

## 2023-08-13 DIAGNOSIS — M6281 Muscle weakness (generalized): Secondary | ICD-10-CM | POA: Diagnosis not present

## 2023-08-13 DIAGNOSIS — E1151 Type 2 diabetes mellitus with diabetic peripheral angiopathy without gangrene: Secondary | ICD-10-CM | POA: Diagnosis not present

## 2023-08-13 DIAGNOSIS — N179 Acute kidney failure, unspecified: Secondary | ICD-10-CM | POA: Diagnosis not present

## 2023-08-13 DIAGNOSIS — L602 Onychogryphosis: Secondary | ICD-10-CM | POA: Diagnosis not present

## 2023-08-13 DIAGNOSIS — L603 Nail dystrophy: Secondary | ICD-10-CM | POA: Diagnosis not present

## 2023-08-13 DIAGNOSIS — L84 Corns and callosities: Secondary | ICD-10-CM | POA: Diagnosis not present

## 2023-08-13 DIAGNOSIS — Z794 Long term (current) use of insulin: Secondary | ICD-10-CM | POA: Diagnosis not present

## 2023-08-14 DIAGNOSIS — M6281 Muscle weakness (generalized): Secondary | ICD-10-CM | POA: Diagnosis not present

## 2023-08-14 DIAGNOSIS — N179 Acute kidney failure, unspecified: Secondary | ICD-10-CM | POA: Diagnosis not present

## 2023-08-15 DIAGNOSIS — N179 Acute kidney failure, unspecified: Secondary | ICD-10-CM | POA: Diagnosis not present

## 2023-08-15 DIAGNOSIS — M6281 Muscle weakness (generalized): Secondary | ICD-10-CM | POA: Diagnosis not present

## 2023-08-18 DIAGNOSIS — N179 Acute kidney failure, unspecified: Secondary | ICD-10-CM | POA: Diagnosis not present

## 2023-08-18 DIAGNOSIS — M6281 Muscle weakness (generalized): Secondary | ICD-10-CM | POA: Diagnosis not present

## 2023-08-19 DIAGNOSIS — M6281 Muscle weakness (generalized): Secondary | ICD-10-CM | POA: Diagnosis not present

## 2023-08-19 DIAGNOSIS — N179 Acute kidney failure, unspecified: Secondary | ICD-10-CM | POA: Diagnosis not present

## 2023-08-19 DIAGNOSIS — B372 Candidiasis of skin and nail: Secondary | ICD-10-CM | POA: Diagnosis not present

## 2023-08-19 DIAGNOSIS — L209 Atopic dermatitis, unspecified: Secondary | ICD-10-CM | POA: Diagnosis not present

## 2023-08-20 DIAGNOSIS — M6281 Muscle weakness (generalized): Secondary | ICD-10-CM | POA: Diagnosis not present

## 2023-08-20 DIAGNOSIS — N179 Acute kidney failure, unspecified: Secondary | ICD-10-CM | POA: Diagnosis not present

## 2023-08-22 DIAGNOSIS — N179 Acute kidney failure, unspecified: Secondary | ICD-10-CM | POA: Diagnosis not present

## 2023-08-22 DIAGNOSIS — M6281 Muscle weakness (generalized): Secondary | ICD-10-CM | POA: Diagnosis not present

## 2023-08-23 DIAGNOSIS — M6281 Muscle weakness (generalized): Secondary | ICD-10-CM | POA: Diagnosis not present

## 2023-08-23 DIAGNOSIS — N179 Acute kidney failure, unspecified: Secondary | ICD-10-CM | POA: Diagnosis not present

## 2023-08-25 DIAGNOSIS — M6281 Muscle weakness (generalized): Secondary | ICD-10-CM | POA: Diagnosis not present

## 2023-08-25 DIAGNOSIS — N179 Acute kidney failure, unspecified: Secondary | ICD-10-CM | POA: Diagnosis not present

## 2023-08-26 DIAGNOSIS — M6281 Muscle weakness (generalized): Secondary | ICD-10-CM | POA: Diagnosis not present

## 2023-08-26 DIAGNOSIS — N179 Acute kidney failure, unspecified: Secondary | ICD-10-CM | POA: Diagnosis not present

## 2023-08-27 DIAGNOSIS — M6281 Muscle weakness (generalized): Secondary | ICD-10-CM | POA: Diagnosis not present

## 2023-08-27 DIAGNOSIS — N179 Acute kidney failure, unspecified: Secondary | ICD-10-CM | POA: Diagnosis not present

## 2023-08-28 DIAGNOSIS — N179 Acute kidney failure, unspecified: Secondary | ICD-10-CM | POA: Diagnosis not present

## 2023-08-28 DIAGNOSIS — M6281 Muscle weakness (generalized): Secondary | ICD-10-CM | POA: Diagnosis not present

## 2023-08-29 DIAGNOSIS — I10 Essential (primary) hypertension: Secondary | ICD-10-CM | POA: Diagnosis not present

## 2023-08-29 DIAGNOSIS — N179 Acute kidney failure, unspecified: Secondary | ICD-10-CM | POA: Diagnosis not present

## 2023-08-29 DIAGNOSIS — E119 Type 2 diabetes mellitus without complications: Secondary | ICD-10-CM | POA: Diagnosis not present

## 2023-08-29 DIAGNOSIS — I872 Venous insufficiency (chronic) (peripheral): Secondary | ICD-10-CM | POA: Diagnosis not present

## 2023-08-29 DIAGNOSIS — L03116 Cellulitis of left lower limb: Secondary | ICD-10-CM | POA: Diagnosis not present

## 2023-08-29 DIAGNOSIS — Z79899 Other long term (current) drug therapy: Secondary | ICD-10-CM | POA: Diagnosis not present

## 2023-08-29 DIAGNOSIS — M6281 Muscle weakness (generalized): Secondary | ICD-10-CM | POA: Diagnosis not present

## 2023-09-01 DIAGNOSIS — N179 Acute kidney failure, unspecified: Secondary | ICD-10-CM | POA: Diagnosis not present

## 2023-09-01 DIAGNOSIS — M6281 Muscle weakness (generalized): Secondary | ICD-10-CM | POA: Diagnosis not present

## 2023-09-02 DIAGNOSIS — M6281 Muscle weakness (generalized): Secondary | ICD-10-CM | POA: Diagnosis not present

## 2023-09-02 DIAGNOSIS — N179 Acute kidney failure, unspecified: Secondary | ICD-10-CM | POA: Diagnosis not present

## 2023-09-02 DIAGNOSIS — E1165 Type 2 diabetes mellitus with hyperglycemia: Secondary | ICD-10-CM | POA: Diagnosis not present

## 2023-09-02 DIAGNOSIS — L209 Atopic dermatitis, unspecified: Secondary | ICD-10-CM | POA: Diagnosis not present

## 2023-09-03 DIAGNOSIS — M6281 Muscle weakness (generalized): Secondary | ICD-10-CM | POA: Diagnosis not present

## 2023-09-03 DIAGNOSIS — N529 Male erectile dysfunction, unspecified: Secondary | ICD-10-CM | POA: Diagnosis not present

## 2023-09-03 DIAGNOSIS — L299 Pruritus, unspecified: Secondary | ICD-10-CM | POA: Diagnosis not present

## 2023-09-03 DIAGNOSIS — N179 Acute kidney failure, unspecified: Secondary | ICD-10-CM | POA: Diagnosis not present

## 2023-09-03 DIAGNOSIS — L259 Unspecified contact dermatitis, unspecified cause: Secondary | ICD-10-CM | POA: Diagnosis not present

## 2023-09-04 DIAGNOSIS — M6281 Muscle weakness (generalized): Secondary | ICD-10-CM | POA: Diagnosis not present

## 2023-09-04 DIAGNOSIS — N179 Acute kidney failure, unspecified: Secondary | ICD-10-CM | POA: Diagnosis not present

## 2023-09-05 DIAGNOSIS — M6281 Muscle weakness (generalized): Secondary | ICD-10-CM | POA: Diagnosis not present

## 2023-09-05 DIAGNOSIS — N179 Acute kidney failure, unspecified: Secondary | ICD-10-CM | POA: Diagnosis not present

## 2023-09-08 DIAGNOSIS — M6281 Muscle weakness (generalized): Secondary | ICD-10-CM | POA: Diagnosis not present

## 2023-09-08 DIAGNOSIS — N179 Acute kidney failure, unspecified: Secondary | ICD-10-CM | POA: Diagnosis not present

## 2023-09-09 DIAGNOSIS — N179 Acute kidney failure, unspecified: Secondary | ICD-10-CM | POA: Diagnosis not present

## 2023-09-09 DIAGNOSIS — M6281 Muscle weakness (generalized): Secondary | ICD-10-CM | POA: Diagnosis not present

## 2023-09-10 DIAGNOSIS — N179 Acute kidney failure, unspecified: Secondary | ICD-10-CM | POA: Diagnosis not present

## 2023-09-10 DIAGNOSIS — M6281 Muscle weakness (generalized): Secondary | ICD-10-CM | POA: Diagnosis not present

## 2023-09-11 DIAGNOSIS — N179 Acute kidney failure, unspecified: Secondary | ICD-10-CM | POA: Diagnosis not present

## 2023-09-11 DIAGNOSIS — M6281 Muscle weakness (generalized): Secondary | ICD-10-CM | POA: Diagnosis not present

## 2023-09-12 DIAGNOSIS — N179 Acute kidney failure, unspecified: Secondary | ICD-10-CM | POA: Diagnosis not present

## 2023-09-12 DIAGNOSIS — M6281 Muscle weakness (generalized): Secondary | ICD-10-CM | POA: Diagnosis not present

## 2023-09-15 DIAGNOSIS — N179 Acute kidney failure, unspecified: Secondary | ICD-10-CM | POA: Diagnosis not present

## 2023-09-15 DIAGNOSIS — M6281 Muscle weakness (generalized): Secondary | ICD-10-CM | POA: Diagnosis not present

## 2023-09-30 ENCOUNTER — Encounter (HOSPITAL_BASED_OUTPATIENT_CLINIC_OR_DEPARTMENT_OTHER): Payer: Medicare PPO | Attending: Internal Medicine | Admitting: Internal Medicine

## 2023-09-30 DIAGNOSIS — E11622 Type 2 diabetes mellitus with other skin ulcer: Secondary | ICD-10-CM | POA: Insufficient documentation

## 2023-09-30 DIAGNOSIS — E440 Moderate protein-calorie malnutrition: Secondary | ICD-10-CM | POA: Insufficient documentation

## 2023-09-30 DIAGNOSIS — I110 Hypertensive heart disease with heart failure: Secondary | ICD-10-CM | POA: Insufficient documentation

## 2023-09-30 DIAGNOSIS — J449 Chronic obstructive pulmonary disease, unspecified: Secondary | ICD-10-CM | POA: Insufficient documentation

## 2023-09-30 DIAGNOSIS — E1140 Type 2 diabetes mellitus with diabetic neuropathy, unspecified: Secondary | ICD-10-CM | POA: Insufficient documentation

## 2023-09-30 DIAGNOSIS — I2510 Atherosclerotic heart disease of native coronary artery without angina pectoris: Secondary | ICD-10-CM | POA: Diagnosis not present

## 2023-09-30 DIAGNOSIS — I5032 Chronic diastolic (congestive) heart failure: Secondary | ICD-10-CM | POA: Insufficient documentation

## 2023-09-30 DIAGNOSIS — L97919 Non-pressure chronic ulcer of unspecified part of right lower leg with unspecified severity: Secondary | ICD-10-CM | POA: Diagnosis not present

## 2023-09-30 DIAGNOSIS — I87333 Chronic venous hypertension (idiopathic) with ulcer and inflammation of bilateral lower extremity: Secondary | ICD-10-CM | POA: Diagnosis not present

## 2023-09-30 DIAGNOSIS — D649 Anemia, unspecified: Secondary | ICD-10-CM | POA: Diagnosis not present

## 2023-09-30 DIAGNOSIS — L97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ed: Secondary | ICD-10-CM | POA: Insufficient documentation

## 2023-09-30 DIAGNOSIS — L97929 Non-pressure chronic ulcer of unspecified part of left lower leg with unspecified severity: Secondary | ICD-10-CM | POA: Diagnosis not present

## 2023-09-30 DIAGNOSIS — L97912 Non-pressure chronic ulcer of unspecified part of right lower leg with fat layer exposed: Secondary | ICD-10-CM

## 2023-09-30 DIAGNOSIS — E1151 Type 2 diabetes mellitus with diabetic peripheral angiopathy without gangrene: Secondary | ICD-10-CM | POA: Diagnosis not present

## 2023-09-30 DIAGNOSIS — I872 Venous insufficiency (chronic) (peripheral): Secondary | ICD-10-CM | POA: Diagnosis not present

## 2023-09-30 DIAGNOSIS — M1990 Unspecified osteoarthritis, unspecified site: Secondary | ICD-10-CM | POA: Insufficient documentation

## 2023-09-30 DIAGNOSIS — I890 Lymphedema, not elsewhere classified: Secondary | ICD-10-CM | POA: Insufficient documentation

## 2023-09-30 DIAGNOSIS — Z794 Long term (current) use of insulin: Secondary | ICD-10-CM | POA: Insufficient documentation

## 2023-10-02 NOTE — Progress Notes (Signed)
 Robicheaux, Graig ([id number]) [id number] Page 1 of 13 Visit Report for 09/30/2023 Chief Complaint Document Details [name], [name] 09/30/2023 10:30 A M Medical Record Number: [id number] Patient Account Number: [id number] Date of Birth/Sex: Treating RN: [birth year] (68 y.o. M) Primary Care Provider: Rexanne Ingle Other Clinician: Referring Provider: Treating Provider/Extender: Rosan Harlene Rexanne Ingle Devra in Treatment: 58 Information Obtained from: Patient Chief Complaint 10/08/2021; bilateral lower extremity wounds Electronic Signature(s) Signed: 10/01/2023 5:26:35 PM By: Rosan Harlene DO Entered By: Rosan Harlene on 09/30/2023 13:14:21 -------------------------------------------------------------------------------- Debridement Details [name], [name] 09/30/2023 10:30 A M Medical Record Number: [id number] Patient Account Number: [id number] Date of Birth/Sex: Treating RN: 07/10/1956 (68 y.o. M) Zochol, Jamie Primary Care Provider: Rexanne Ingle Other Clinician: Referring Provider: Treating Provider/Extender: Rosan Harlene Rexanne Ingle Devra in Treatment: 41 Debridement Performed for Assessment: Wound #30 Left,Posterior Lower Leg Performed By: Physician Rosan Harlene, DO The following information was scribed by: Zochol, Jamie The information was scribed for: Rosan Harlene Debridement Type: Debridement Severity of Tissue Pre Debridement: Fat layer exposed Level of Consciousness (Pre-procedure): Awake and Alert Pre-procedure Verification/Time Out Yes - 11:25 Taken: Start Time: 11:26 Percent of Wound Bed Debrided: 100% T Area Debrided (cm): otal 151.47 Tissue and other material debrided: Viable, Non-Viable, Slough, Subcutaneous, Slough Level: Skin/Subcutaneous Tissue Debridement Description: Excisional Instrument: Curette Bleeding: Minimum Hemostasis Achieved:  Pressure Response to Treatment: Procedure was tolerated well Level of Consciousness (Post- Awake and Alert procedure): Post Debridement Measurements of Total Wound Length: (cm) 13.4 Width: (cm) 14.4 Depth: (cm) 0.3 Volume: (cm) 45.465 Character of Wound/Ulcer Post Debridement: Requires Further Debridement Severity of Tissue Post Debridement: Fat layer exposed Montoro, Ranson ([id number]) [id number] Page 2 of 13 Post Procedure Diagnosis Same as Pre-procedure Electronic Signature(s) Signed: 09/30/2023 4:32:03 PM By: Zochol, Jamie RN Signed: 10/01/2023 5:26:35 PM By: Rosan Harlene DO Entered By: Zochol, Jamie on 09/30/2023 11:26:59 -------------------------------------------------------------------------------- Debridement Details [name], [name] 09/30/2023 10:30 A M Medical Record Number: [id number] Patient Account Number: [id number] Date of Birth/Sex: Treating RN: 1956/01/10 (68 y.o. M) Zochol, Jamie Primary Care Provider: Rexanne Ingle Other Clinician: Referring Provider: Treating Provider/Extender: Rosan Harlene Rexanne Ingle Devra in Treatment: 58 Debridement Performed for Assessment: Wound #31 Right,Medial Lower Leg Performed By: Physician Rosan Harlene, DO The following information was scribed by: Zochol, Jamie The information was scribed for: Rosan Harlene Debridement Type: Debridement Severity of Tissue Pre Debridement: Fat layer exposed Level of Consciousness (Pre-procedure): Awake and Alert Pre-procedure Verification/Time Out Yes - 11:25 Taken: Start Time: 11:26 Pain Control: Lidocaine  4% T opical Solution Percent of Wound Bed Debrided: 100% T Area Debrided (cm): otal 1.7 Tissue and other material debrided: Viable, Non-Viable, Slough, Subcutaneous, Slough Level: Skin/Subcutaneous Tissue Debridement Description: Excisional Instrument: Curette Bleeding: Minimum Hemostasis Achieved: Pressure Response  to Treatment: Procedure was tolerated well Level of Consciousness (Post- Awake and Alert procedure): Post Debridement Measurements of Total Wound Length: (cm) 2.4 Width: (cm) 0.9 Depth: (cm) 0.1 Volume: (cm) 0.17 Character of Wound/Ulcer Post Debridement: Requires Further Debridement Severity of Tissue Post Debridement: Fat layer exposed Post Procedure Diagnosis Same as Pre-procedure Electronic Signature(s) Signed: 09/30/2023 4:32:03 PM By: Zochol, Jamie RN Signed: 10/01/2023 5:26:35 PM By: Rosan Harlene DO Entered By: Zochol, Jamie on 09/30/2023 11:27:46 HPI Details -------------------------------------------------------------------------------- Dilorenzo, Dillyn ([id number]) [id number] Page 3 of 13 [name], [name] 09/30/2023 10:30 A M Medical Record Number: [id number] Patient Account Number: [id number] Date of Birth/Sex: Treating  RN: 16-Dec-1955 (68 y.o. M) Primary Care Provider: Rexanne Ingle Other Clinician: Referring Provider: Treating Provider/Extender: Rosan Harlene Rexanne Ingle Devra in Treatment: 68 History of Present Illness HPI Description: 07/07/15; this is a patient who is in hospital on 8/2 through 8/4. He had cellulitis and abscess of predominantly I think the left leg. He received IV antibiotics. Plain x-ray showed no osteomyelitis. An MRI of the left leg did not show osteomyelitis. Cultures showed no predominant organism. His hemoglobin A1c was 9.8. He has a history of venous stasis also peripheral vascular disease. He was discharged to [facility] nursing home. He really has extensive ulcerations on the left lateral leg including a major wound that communicates both posteriorly and superiorly w. When drainage coming out of 2 smaller areas. He has a smaller wound on the posterior medial left leg. He has more predominantly venous insufficiency wounds on predominantly the right medial leg above the ankle the right  foot into sections. He has recently been put on Augmentin  at the nursing home. Previous ABIs/arterial evaluation showed triphasic waves diffusely he does not have a major ischemic issue a left lower extremity venous duplex also exam showed no evidence of a DVT on 6/21 07/21/15; the patient arrives with really no major change. Culture I did last time was negative. He has not had a follow-up MRI I ordered. The wounds are macerated covered with a thick gelatinous surface slough. There is necrotic subcutaneous tissue 08/04/15; the patient arrives with a considerable improvement in the majority of his wound area. The area on the left leg now has what looks to be a granulated base. Most of the wounds on the left leg required an aggressive surgical debridement to remove nonviable fibrinous eschar and subcutaneous tissue however after debridement most of this looks better. Although I had asked for repeat MRI of the left leg when he first came in here with grossly purulent material coming out of his wounds, it does not appear that this is been done and nor do I actually feel that strongly about it right now. He has severe surrounding venous insufficiency and inflammation. I don't believe he has significant PAD 08/25/15. In general there is still a considerable wound area here but with still extensive service adherent slough. The patient will not allow mechanical debridement due to pain. He did not tolerate Medihoney therefore we are left with Santyl for now. She would appear that he has severe surrounding venous stasis. There is no evidence of the infection that may have had something to do with the pathogenesis of these wounds 09/29/15; patient still has substantial wound area on the left leg with a cluster of several wounds on the lateral leg confluently on the left leg posteriorly and then a substantial wound on the medial leg. On the right leg a substantial wound medially and a small area on the right lateral  foot. All of these underwent a substantial surgical debridement with curettes which she tolerated better than he has in the past. This started as a complex cellulitis in the face of chronic venous insufficiency and inflammation. 10/13/15; substantial cluster of wounds on the lateral aspect of his left leg confluently around to the other side. Extensive surgical debridement to remove for redness surface slough nonviable subcutaneous tissue. This is not an improvement. Also substantial wound on the medial right leg which is largely unchanged. The etiology of this was felt to be a complex cellulitis in the summer of 2016 in the face of chronic venous insufficiency  and inflammation. The patient states that the wound on the right medial leg has been there for years off and on. 10/20/15; we are able to start Hydrofera Blue to these extensive wound areas left greater than right on Tuesday after I spoke to the wound care nurse at the facility. He arrives here today for extensive surgical debridement for the wounds on the lateral aspect of his left leg posterior left leg, this is almost circumferential. He has a large wound on the medial aspect of his right leg although he finds this too painful for debridement 10/27/15; I continue to bring this patient back for frequent debridement/weekly debridement severe. We have been using Hydrofera Blue. Unfortunately this has not really had any improvement. The debridement surgery difficult and painful for the patient 11/23/15; this patient spent a complex hospitalization admitted with acute kidney injury, anemia, cellulitis of the lower extremity, he was felt to have sepsis pathophysiology although his blood cultures were negative. He had plain x-rays of both legs that were negative for osseous abnormalities. He was seen by infectious disease and placed on a workup for vasculitis that was negative including a biopsy 4 all were consistent with stasis dermatitis. Vital  broad- spectrum antibiotics he continued to spike fevers. Urine and chest x-ray were negative Dopplers on admission ruled out a DVT All antibiotics were stopped on . 2/17. Since his return to [facility] skilled nursing facility I believe they have been using Xeroform 12/07/15 the patient arrives today with the area on his right medial leg actually looking quite stable. No debridement. The rest of his extensive wounds on the left lateral left posterior extending into the left medial leg all required extensive debridement. I think this is probably going to need to and up in the hands of plastic surgery. We'll attempt to change him back to Hydrofera Blue. Arrange consultation with plastic surgery at [facility] for this almost circumferential wound on the left side 12/21/15 right leg covered in surface slough. This was debridement. Left leg extensive wounds all carefully examined. This is almost circumferential on the left side especially on the posterior calf. No debridement is necessary. 01/04/16 the patient has been to [facility] plastic surgery. Unfortunately it doesn't look like they had any of the prior workup on this patient. They're in the middle of vascular workup. It sounds as though they're applying Hydrofera Blue at the nursing home. 01/22/16; the patient has been back to see [facility]. There apparently making plans to possibly do skin grafts over his large venous insufficiency ulcerations. The patient tells me that he goes to hematology in [facility] and variably uses the term platelets red cells and the description of his problem. He is also a [employer] and will not allow transfusion of blood products. I do not see any major difference in the wound on the right medial leg and circumferentially across the left medial to left lateral leg. Did not attempt to debride these today. Readmission: 05/05/18 on evaluation today patient presents for reevaluation here in our clinic although I have  previously seen him in the skilled nursing facility over the past year and a half when I was working in the skilled nursing facility realm instead of covering in the clinic. With that being said during that time we had initiated most recently dressings with Hydrofera Blue Dressing along with the [employer] which had done very well for him. Much better than the Kerlex Coban wraps. With that being said the patient had been  doing fairly well and was making progress when I last saw him. Upon evaluation today it appears that the wounds are actually a little bit worse than when I last saw him although definitely not dramatically so. There does not appear to be any evidence of infection which is good news. With that being said he has been tolerating the wraps without complication his left hip still gives him a lot of trouble which is his main issue as far as movement is concerned. Unbeknownst to me he has not had venous studies that I can find. He previously told me he had there were arterial studies noted back from 04/27/15 which showed that he had try phasic blood flow throughout and again his test appear to be completely normal as performed by Dr. Redell Cheney. With that being said I cannot find where he had venous studies. The patient still states he thinks he did these definitely had no venous intervention at this point. Upon evaluation today it does not appear to me that the patient has any evidence of cellulitis. He does have some chronic venous insufficiency which I think has led to stasis dermatitis but again this is not appearing to be infected at this point. No fevers, chills, nausea, or vomiting noted at this time. READMISSION 06/30/2018 This is a now 68 year old man we have had in this clinic at multiple times in the past. Most recently he came in August and was seen by [facility]. It is not clear why he did not come back we asked him really did not get a straight answer. He is at [facility]  skilled facility he is a man who has severe chronic venous insufficiency with lymphedema and has had severe wounds on his left greater than right leg. We had him here in 2016 and 17 ultimately referred him to [facility]. He had arterial studies and venous studies done at [facility] although I am not able to access these records I see they are actually done. He also had multiple biopsies that were negative for malignancy showing changes compatible with chronic inflammation. As I understand things in [place] they are using Iodoflex and Unna boots. I am not really sure how they are getting enough Iodoflex for the large wound area especially on his left calf. Nevertheless the wounds look better than when I saw this in 2017. There were plans for him to have plastic surgery in 2018 and I think he was actually prepped for surgery however he was ultimately denied because the patient was an active smoker. The patient is not systemically unwell. The wounds are painful however given the size especially on the left this is not surprising. ABIs in this clinic were 0.78 on the left and 0.91 on the right 07/13/2018; this is a patient with bilateral severe venous ulcers with secondary lymphedema. The wounds are on the right medial calf and a substantial part of the left posterior calf and some involvement medially and laterally on the left. We changed him to silver alginate under Unna boot's last time. The wounds actually look fairly satisfactory today. 08/14/2018; this is a patient with severe bilateral venous stasis ulcers with secondary lymphedema left greater than right he is at [facility] using silver Meadors, Ayo ([id number]) [phone].pdf Page 4 of 13 alginate under Unna boot's I do not think there is much change on this visit versus last time I saw him a month ago Readmission: 11/04/18 patient presents today for follow-up concerning his bilateral lower extremity ulcers.  His a  right medial ankle ulcer and a left leg that has ulcers over a large proportion of the surface area between the ankle and knee. Unfortunately this does cause some discomfort for the patient although it doesn't seem to be as uncomfortable as it has been in the past. He is seen today for evaluation after a referral by IKeisha back to the clinic. Unfortunately has a lot of Slough noted on the surface of his wounds. He's been treated currently it sounds like with Dakin's soaked gauze and they have not been performing the Unna Boot wrap that was previously recommended. I'm not exactly sure what the reason for the change was. He does have less slough on the surface of the wound but again this is something that is often a constant issue for him. Again he's had these wounds for many years. I've known him for two years at least during that time when I was taking care of them in the facility he is Artie had the wounds for several years prior. READMISSION 02/25/2020 Mardeen Skiff is a now 68 year old man. He has been in our clinic several times before most extensively in October 2016 through May 2017. At that time he had absolutely substantial bilateral lower extremity wounds secondary to chronic venous insufficiency and lymphedema. We ultimately referred him to [facility] he had a series of biopsies only showed suggestion of wound secondary to chronic venous insufficiency. He had a less extensive stay in our clinic in 2019 and then a single visit in February 2020. He is a resident at [facility] skilled facility. I think he has had intermittently had in facility wound care. He returns today. They are using silver alginate on the wounds although I am not sure what type of compression. Previously he has favored Unna boots. He is not felt to have an arterial issue. Past medical history; lymphedema and chronic venous insufficiency, diabetes mellitus, hypertension, congestive heart failure We did not do arterial studies  today 04/27/2020 on evaluation patient appears to be doing really about the same as when I have known him and seen him in the past. He does have wounds on the right and left legs at this point. Fortunately there is no signs of active infection at this time. 9/2; 1 month follow-up. This is a man with severe chronic venous insufficiency and lymphedema. When I first met him he had horrible circumferential bilateral lower extremity ulcers. We have been using silver alginate up until early last month when it was changed to Hydrofera Blue and Unna boot's more or less says maintenance dressings. Since the last time he was here the facility where he resides [facility] called to report they could no longer do Unna boot so he is apparently only been receiving kerlix Coban the left leg is certainly a lot worse with almost circumferential large wounds especially lateral but now medial and posterior as well. He has a standard same looking wound on the right medial lower leg. 10/1; absolutely no improvement here. In fact I do not think anything much is changed. The substantial area on his left lateral and left posterior calf is still open may be slightly larger. He has a more modest wound on the right medial calf. None of these look any different. He comes in with a kerlix Coban wrap this will simply not be adequate. He has too much edema in this leg He also is complaining a lot of pain in his left heel area. 11/8; potential wounds on  the left lateral and posterior calf and a smaller oval wound in the right medial. We have been using Hydrofera Blue under 4-layer compression. He was using Unna boots still 2 months ago the facility called to say they can no longer place these so we have been using 4-layer compression. The surface area of the area on the left is so large there is very few alternatives to what we can use on this either silver alginate or Hydrofera Blue. He was complaining of pain on the left heel last  time I asked for an x-ray this was apparently done although we do not have the result. He is not complaining of pain today. 08/16/2020 on evaluation today patient is is seen for a early visit due to the fact that he apparently is having issues I was told when they called on Monday with a MRSA infection that they felt like we needed to see him for because his legs looked horrible. With that being said based on what I see at this point it does not appear that the patient is actually having a terrible infection in fact compared to last time I saw him his legs do not appear to be doing too poorly at all at this time. Nonetheless he has been on doxycycline that is not going to be a good option for him based on the culture report that I have for review today which graded as a partial report with still several organisms pending as far as identification is concerned we did contact them but again they do not have the final report yet. Either way based on what we see it appears that doxycycline is one of the few medications that will not work for the Citrobacter. Obviously I think that a different medication would be a good option for him since there is a gram-negative organism pending I am likely can I suggest Cipro as the best of backup antibiotic to switch him to at this point. All this was discussed with the patient today. 12/20; not much change in the substantial wound on the left posterior calf and the small area on the right medial. He has a new area on the left anterior which looks like a wrap injury. Had some thoughts about doing a deep tissue culture here although we will put that off for next time. Using silver alginate as a primary dressing 2/10; substantial area on the left posterior calf. This measures smaller but still is a very large area. He has the area on the right anterior medial which also is a fairly large wound but much smaller than not on the left. His edema control is quite good. We  have been using silver alginate because of the size of the wounds. 4/7; substantial wound on the left posterior calf and a smaller area on the right medial lower leg. These are very chronic wounds which we have classified is venous. Previously worked up at [facility] for 5 years ago at which time I had sent and will send him over for consideration of extensive skin graft I know they biopsied this many times but he never had plastic surgery. The wound area is much too large for standard size dressings we have been using silver alginate but we did give him a good trial last fall of Hydrofera Blue that did not make any difference either. If anything the area on the right medial lower leg over time has gotten a lot smaller as has the area on the left although  it still quite substantial now. Readmission 10/08/2021 Mr. Bunyan Brier is a 68 year old male with a past medical history of insulin -dependent type 2 diabetes with last hemoglobin A1c of 10.2, chronic venous insufficiency, lymphedema and chronic diastolic heart failure that presents to the clinic for bilateral lower extremity wounds. He has been followed in our clinic previously for the past 6 years for these issues. He was last seen in April 2022. He is inconsistent with his appointments in our clinic. These wounds have not healed over the past few years. He is currently using silver alginate to the wound beds and keeping these covered with Kerlix. He currently denies systemic signs of infection. He was recently hospitalized for septic shock secondary to bacteremia from likely chronic leg wounds And is currently on oral antibiotics to be completed this week. 1/30; patient presents for follow-up. He has no issues or complaints today. 2/6; patient presents for follow-up. He reports more discomfort with the increase in compression wraps. He currently denies systemic signs of infection and feels well overall. 2/13; patient presents for follow-up. He  reports tolerating the current leg/Coban wrap well. He denies systemic signs of infection and feels fine overall. 2/21; patient presents for follow-up. He has no issues or complaints today. He tolerated the kirlex/Coban wrap. He denies systemic signs of infection. 3/21; patient presents for follow-up. He has no issues or complaints today. He saw infectious disease, Dr. Elaine. Nothing further to do from an infectious disease point. Levaquin was stopped. He currently denies systemic signs of infection. 4/4; patient presents for follow-up. His facility was able to obtain the [facility] antibiotic and has started using this with dressing changes/wrap changes. Patient has no issues or complaints today. He denies signs of infection. 4/18; patient presents for follow-up. He has no issues or complaints today. He denies signs of infection. Littrell, Caroline ([id number]) [id number] Page 5 of 13 5/9; patient presents for follow-up. He has been using Keystone antibiotic under Kerlix/Coban. He denies signs of infection today. 6/6; monthly follow-up. Patient is at [facility] skilled facility. He has been using Keystone probably silver alginate under kerlix Coban. We use Sorbact here. Wound condition has improved 7/11; patient presents for follow-up. We have been using Keystone with Sorbact under Kerlix/Coban. Patient has no issues or complaints today. 8/8; patient presents for follow-up. He is still using Keystone and Sorbact under Kerlix/Coban to his lower extremities bilaterally. He has no issues or complaints today. He denies signs of infection. 9/12; patient presents for follow-up. We have been using Keystone with Sorbact and calcium  alginate under Kerlix/Coban to lower extremities bilaterally. He has no issues or complaints today. 10/10; patient presents for follow-up. We continue to use [facility] with Sorbact and calcium  alginate under Kerlix/Coban. He has tolerated this well and he  has no issues or complaints today. 11/14; patient presents for follow-up. We have been using Keystone with Sorbact and calcium  alginate under Kerlix/Coban to the lower extremities bilaterally. He cannot tolerate higher compression. He states that the facility he resides and changes his dressings 3 times a week. He has no issues or complaints today. 12/12; patient presents for follow-up. We have been using Keystone antibiotic ointment with Sorbact and calcium  alginate under Kerlix/Coban to the lower extremities bilaterally. He is open to trying a higher level compression. His ABIs do not suggest significant arterial insufficiency. 1/8; patient presents for follow up. We have been using Keystone antibiotic ointment with Sorbact and calcium  alginate. We increased the compression to 3 layer compression last visit however  patient came in with Kerlix/Coban and no sorbact. It is unclear if the patient's facility has been using a higher level compression. We will follow-up with his facility. 12/17/2022 Patient has not been back since January. Patient has bilateral lower extremity wounds. These are the same wounds we have been treating. Facility has been using silver alginate under compression. Patient has no issues or complaints today. 5/7; this is a patient who lives in [place] skilled facility. We have been following this man for years with severe bilateral lower extremity ulcers/venous wounds. When he first came to the clinic he had multiple biopsies but they were never particularly diagnostic of any other etiology. The wound area especially on the left calf area is particularly large not well covered by anything that is skilled facility would have except silver alginate. We are using kerlix Coban compression bilaterally. There has been some improvement in the right medial lower leg. There is a fairly large amount of nonviable debris especially on the left 6/18; patient presents for follow-up. He is at  [facility] facility and states that the dressings have not been changed in over a week. He does have mild odor on exam. He denies systemic signs of infection. We have been using silver alginate under Kerlix/Coban to the lower extremities bilaterally 6/25; patient presents for follow-up. We have been using Dakin's under compression wrap. His wounds have improved greatly. I did a wound culture at last clinic visit that grew over 13 different bacteria strains with the most prevalent being E. coli and Pseudomonas aeruginosa. Keystone antibiotic spray was ordered and he should be receiving this in the next week. Patient has no issues or complaints today. He denies signs of infection. 7/23; Patient presents for follow up. He has been using keystone antibiotic spray to the wound beds. Today there is no silver alginate on the wound beds or kerlix/coban compressin wrap. He resides in a facility that changes the dressings several times a week. 9/10; patient presents for follow-up. We have been using Keystone antibiotic ointment with silver alginate under Kerlix/Coban to the lower extremities bilaterally. Wounds have healthier granulation tissue and appear well-healing. He has no issues or complaints. He denies signs of infection. 10/22; this patient that is been in this clinic for a long period of time. He is also a long-term resident at [facility] skilled facility. He has severe bilateral lymphedema secondary to chronic venous insufficiency. At 1 point the area on his left lower leg was circumferential extending to a large wound on the right medial and posterior calf. He was completely worked up for this at [facility] including biopsies, lab work etc. no additional diagnoses were found as I recall. We are using silver alginate at the facility with [facility] change 3 times a week. He did not come in with the [facility] today 11/18; 1 month follow-up. Wounds appear better using Keystone silver alginate. He  does not come into the clinic with [facility] therefore we use topical gentamicin and silver alginate. 09/30/2023; patient presents for follow-up. He has wounds on his legs bilaterally left greater than right. We have been using antibiotic ointment and silver alginate under compression therapy. He has tolerated this well. He has no issues or complaints today. He resides in a facility that changes the dressings every other day. He denies signs of infection. Electronic Signature(s) Signed: 10/01/2023 5:26:35 PM By: Rosan Raisin DO Entered By: Rosan Raisin on 09/30/2023 13:28:33 -------------------------------------------------------------------------------- Physical Exam Details [name], A  UDIE 09/30/2023 10:30 A M Medical Record Number: [id number] Patient Account Number: [id number] Date of Birth/Sex: Treating RN: 1956-01-21 (68 y.o. M) Primary Care Provider: Rexanne Ingle Other Clinician: Referring Provider: Treating Provider/Extender: Rosan Harlene Rexanne Ingle Devra in Treatment: 66 Constitutional respirations regular, non-labored and within target range for patient.SABRA Armenti, Zylon ([id number]) [id number] Page 6 of 13 Cardiovascular 2+ dorsalis pedis/posterior tibialis pulses. Psychiatric pleasant and cooperative. Notes Wounds to the lower extremities bilaterally with granulation tissue and nonviable tissue. No surrounding signs of infection including increased warmth, erythema or purulent drainage. Electronic Signature(s) Signed: 10/01/2023 5:26:35 PM By: Rosan Harlene DO Entered By: Rosan Harlene on 09/30/2023 13:29:28 -------------------------------------------------------------------------------- Physician Orders Details [name], [name] 09/30/2023 10:30 A M Medical Record Number: [id number] Patient Account Number: [id number] Date of Birth/Sex: Treating RN: [birth year] (68 y.o. M)  Zochol, Jamie Primary Care Provider: Rexanne Ingle Other Clinician: Referring Provider: Treating Provider/Extender: Rosan Harlene Rexanne Ingle Devra in Treatment: 35 Verbal / Phone Orders: No Diagnosis Coding Follow-up Appointments ppointment in: - Dr. Rufus 2 months *** **** Return A Other: - [facility] *********PLEASE WRAP BOTH LEGS WITH KERLIX AND COBAN FROM BASE OF TOES TO JUST BELOW KNEE FOR COMPRESSION.****** Next dressing change should be Thursday at the assisted living facility. Anesthetic (In clinic) Topical Lidocaine  4% applied to wound bed Bathing/ Shower/ Hygiene May shower with protection but do not get wound dressing(s) wet. Protect dressing(s) with water repellant cover (for example, large plastic bag) or a cast cover and may then take shower. Non Wound Condition Other Non Wound Condition Orders/Instructions: - in clinic only applied gentamicin and bactroban  mixed and applied to wound bed. Wound Treatment Wound #30 - Lower Leg Wound Laterality: Left, Posterior Cleanser: Soap and Water 3 x Per Week/30 Days Discharge Instructions: May shower and wash wound with dial antibacterial soap and water prior to dressing change. Topical: Gentamicin 3 x Per Week/30 Days Discharge Instructions: As directed by physician Topical: Triamcinolone 3 x Per Week/30 Days Discharge Instructions: Apply Triamcinolone as directed Topical: topical compounding antibiotics from [facility] 3 x Per Week/30 Days Discharge Instructions: Spray then apply calcium  alginate Ag. Prim Dressing: Maxorb Extra Ag+ Alginate Dressing, 4x4.75 (in/in) 3 x Per Week/30 Days ary Discharge Instructions: Apply over the antibiotic spray ****once the topical compounding antibiotics arrive. Secondary Dressing: ABD Pad, 8x10 3 x Per Week/30 Days Discharge Instructions: Apply over primary dressing as directed. Secured With: [employer], 4.5x3.1 (in/yd) 3 x Per Week/30 Days Discharge  Instructions: Secure with Kerlix as directed. Compression Wrap: Kerlix Roll 4.5x3.1 (in/yd) 3 x Per Week/30 Days Discharge Instructions: Apply Kerlix and Coban compression as directed. Compression Wrap: Coban Self-Adherent Wrap 4x5 (in/yd) 3 x Per Week/30 Days Murcia, Scotty ([id number]) [id number] Page 7 of 13 Discharge Instructions: Apply over Kerlix as directed. Wound #31 - Lower Leg Wound Laterality: Right, Medial Cleanser: Soap and Water Every Other Day/30 Days Discharge Instructions: May shower and wash wound with dial antibacterial soap and water prior to dressing change. Peri-Wound Care: Triamcinolone 15 (g) Every Other Day/30 Days Discharge Instructions: Use triamcinolone 15 (g) as directed Topical: topical compounding antibiotics from [facility] Every Other Day/30 Days Discharge Instructions: Spray then apply calcium  alginate Ag. Prim Dressing: Maxorb Extra Ag+ Alginate Dressing, 4x4.75 (in/in) Every Other Day/30 Days ary Discharge Instructions: Apply over the antibiotic spray ****once the topical compounding antibiotics arrive. Secondary Dressing: ABD Pad, 8x10 Every Other Day/30 Days Discharge Instructions: Apply over primary dressing as directed. Secured With: Kerlix Roll  Sterile, 4.5x3.1 (in/yd) Every Other Day/30 Days Discharge Instructions: Secure with Kerlix as directed. Compression Wrap: Kerlix Roll 4.5x3.1 (in/yd) Every Other Day/30 Days Discharge Instructions: Apply Kerlix and Coban compression as directed. Compression Wrap: Coban Self-Adherent Wrap 4x5 (in/yd) Every Other Day/30 Days Discharge Instructions: Apply over Kerlix as directed. Electronic Signature(s) Signed: 10/01/2023 5:26:35 PM By: Rosan Raisin DO Entered By: Rosan Raisin on 09/30/2023 13:29:45 -------------------------------------------------------------------------------- Problem List Details [name], [name] 09/30/2023 10:30 A  M Medical Record Number: [id number] Patient Account Number: [id number] Date of Birth/Sex: Treating RN: 22-Dec-1955 (68 y.o. M) Primary Care Provider: Rexanne Ingle Other Clinician: Referring Provider: Treating Provider/Extender: Rosan Raisin Rexanne Ingle Devra in Treatment: 62 Active Problems ICD-10 Encounter Code Description Active Date MDM Diagnosis I87.333 Chronic venous hypertension (idiopathic) with ulcer and inflammation of 12/17/2022 No Yes bilateral lower extremity 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ed3/26/2024 No Yes L97.912 Non-pressure chronic ulcer of unspecified part of right lower leg with fat layer 12/17/2022 No Yes exposed I89.0 Lymphedema, not elsewhere classified 12/17/2022 No Yes E44.0 Moderate protein-calorie malnutrition 12/17/2022 No Yes Kosh, Derrik ([id number]) [id number] Page 8 of 13 E11.622 Type 2 diabetes mellitus with other skin ulcer 12/17/2022 No Yes I50.32 Chronic diastolic (congestive) heart failure 12/17/2022 No Yes Inactive Problems Resolved Problems Electronic Signature(s) Signed: 10/01/2023 5:26:35 PM By: Rosan Raisin DO Entered By: Rosan Raisin on 09/30/2023 13:14:04 -------------------------------------------------------------------------------- Progress Note Details [name], [name] 09/30/2023 10:30 A M Medical Record Number: [id number] Patient Account Number: [id number] Date of Birth/Sex: Treating RN: 02/01/1956 (68 y.o. M) Primary Care Provider: Rexanne Ingle Other Clinician: Referring Provider: Treating Provider/Extender: Rosan Raisin Rexanne Ingle Devra in Treatment: 46 Subjective Chief Complaint Information obtained from Patient 10/08/2021; bilateral lower extremity wounds History of Present Illness (HPI) 07/07/15; this is a patient who is in hospital on 8/2 through 8/4. He had cellulitis and abscess of  predominantly I think the left leg. He received IV antibiotics. Plain x-ray showed no osteomyelitis. An MRI of the left leg did not show osteomyelitis. Cultures showed no predominant organism. His hemoglobin A1c was 9.8. He has a history of venous stasis also peripheral vascular disease. He was discharged to [facility] nursing home. He really has extensive ulcerations on the left lateral leg including a major wound that communicates both posteriorly and superiorly w. When drainage coming out of 2 smaller areas. He has a smaller wound on the posterior medial left leg. He has more predominantly venous insufficiency wounds on predominantly the right medial leg above the ankle the right foot into sections. He has recently been put on Augmentin  at the nursing home. Previous ABIs/arterial evaluation showed triphasic waves diffusely he does not have a major ischemic issue a left lower extremity venous duplex also exam showed no evidence of a DVT on 6/21 07/21/15; the patient arrives with really no major change. Culture I did last time was negative. He has not had a follow-up MRI I ordered. The wounds are macerated covered with a thick gelatinous surface slough. There is necrotic subcutaneous tissue 08/04/15; the patient arrives with a considerable improvement in the majority of his wound area. The area on the left leg now has what looks to be a granulated base. Most of the wounds on the left leg required an aggressive surgical debridement to remove nonviable fibrinous eschar and subcutaneous tissue however after debridement most of this looks better. Although I had asked for  repeat MRI of the left leg when he first came in here with grossly purulent material coming out of his wounds, it does not appear that this is been done and nor do I actually feel that strongly about it right now. He has severe surrounding venous insufficiency and inflammation. I don't believe he has significant PAD 08/25/15. In general  there is still a considerable wound area here but with still extensive service adherent slough. The patient will not allow mechanical debridement due to pain. He did not tolerate Medihoney therefore we are left with Santyl for now. She would appear that he has severe surrounding venous stasis. There is no evidence of the infection that may have had something to do with the pathogenesis of these wounds 09/29/15; patient still has substantial wound area on the left leg with a cluster of several wounds on the lateral leg confluently on the left leg posteriorly and then a substantial wound on the medial leg. On the right leg a substantial wound medially and a small area on the right lateral foot. All of these underwent a substantial surgical debridement with curettes which she tolerated better than he has in the past. This started as a complex cellulitis in the face of chronic venous insufficiency and inflammation. 10/13/15; substantial cluster of wounds on the lateral aspect of his left leg confluently around to the other side. Extensive surgical debridement to remove for redness surface slough nonviable subcutaneous tissue. This is not an improvement. Also substantial wound on the medial right leg which is largely unchanged. The etiology of this was felt to be a complex cellulitis in the summer of 2016 in the face of chronic venous insufficiency and inflammation. The patient states that the wound on the right medial leg has been there for years off and on. 10/20/15; we are able to start Hydrofera Blue to these extensive wound areas left greater than right on Tuesday after I spoke to the wound care nurse at the facility. He arrives here today for extensive surgical debridement for the wounds on the lateral aspect of his left leg posterior left leg, this is almost circumferential. He has a large wound on the medial aspect of his right leg although he finds this too painful for debridement 10/27/15; I continue to  bring this patient back for frequent debridement/weekly debridement severe. We have been using Hydrofera Blue. Unfortunately this has not really had any improvement. The debridement surgery difficult and painful for the patient 11/23/15; this patient spent a complex hospitalization admitted with acute kidney injury, anemia, cellulitis of the lower extremity, he was felt to have sepsis pathophysiology although his blood cultures were negative. He had plain x-rays of both legs that were negative for osseous abnormalities. He was seen by infectious disease and placed on a workup for vasculitis that was negative including a biopsy 4 all were consistent with stasis dermatitis. Vital broad-spectrum antibiotics he continued to spike fevers. Urine and chest x-ray were negative Dopplers on admission ruled out a DVT All antibiotics were stopped on 2/17. . Since his return to [facility] skilled nursing facility I believe they have been using Xeroform 12/07/15 the patient arrives today with the area on his right medial leg actually looking quite stable. No debridement. The rest of his extensive wounds on the left lateral left posterior extending into the left medial leg all required extensive debridement. I think this is probably going to need to and up in the hands of plastic surgery. We'll attempt to change him  back to Hydrofera Blue. Arrange consultation with plastic surgery at [facility] for this almost circumferential wound Ellsworth, Ballard ([id number]) [id number] Page 9 of 13 on the left side 12/21/15 right leg covered in surface slough. This was debridement. Left leg extensive wounds all carefully examined. This is almost circumferential on the left side especially on the posterior calf. No debridement is necessary. 01/04/16 the patient has been to [facility] plastic surgery. Unfortunately it doesn't look like they had any of the prior workup on this patient. They're in the middle  of vascular workup. It sounds as though they're applying Hydrofera Blue at the nursing home. 01/22/16; the patient has been back to see [facility]. There apparently making plans to possibly do skin grafts over his large venous insufficiency ulcerations. The patient tells me that he goes to hematology in [facility] and variably uses the term platelets red cells and the description of his problem. He is also a [employer] and will not allow transfusion of blood products. I do not see any major difference in the wound on the right medial leg and circumferentially across the left medial to left lateral leg. Did not attempt to debride these today. Readmission: 05/05/18 on evaluation today patient presents for reevaluation here in our clinic although I have previously seen him in the skilled nursing facility over the past year and a half when I was working in the skilled nursing facility realm instead of covering in the clinic. With that being said during that time we had initiated most recently dressings with Hydrofera Blue Dressing along with the [employer] which had done very well for him. Much better than the Kerlex Coban wraps. With that being said the patient had been doing fairly well and was making progress when I last saw him. Upon evaluation today it appears that the wounds are actually a little bit worse than when I last saw him although definitely not dramatically so. There does not appear to be any evidence of infection which is good news. With that being said he has been tolerating the wraps without complication his left hip still gives him a lot of trouble which is his main issue as far as movement is concerned. Unbeknownst to me he has not had venous studies that I can find. He previously told me he had there were arterial studies noted back from 04/27/15 which showed that he had try phasic blood flow throughout and again his test appear to be completely normal as performed by Dr. Redell Cheney. With that being said I cannot find where he had venous studies. The patient still states he thinks he did these definitely had no venous intervention at this point. Upon evaluation today it does not appear to me that the patient has any evidence of cellulitis. He does have some chronic venous insufficiency which I think has led to stasis dermatitis but again this is not appearing to be infected at this point. No fevers, chills, nausea, or vomiting noted at this time. READMISSION 06/30/2018 This is a now 68 year old man we have had in this clinic at multiple times in the past. Most recently he came in August and was seen by [facility] stone. It is not clear why he did not come back we asked him really did not get a straight answer. He is at [facility] skilled facility he is a man who has severe chronic venous insufficiency with lymphedema and has had severe wounds on his left greater than right leg. We had  him here in 2016 and 17 ultimately referred him to [facility]. He had arterial studies and venous studies done at [facility] although I am not able to access these records I see they are actually done. He also had multiple biopsies that were negative for malignancy showing changes compatible with chronic inflammation. As I understand things in [place] they are using Iodoflex and Unna boots. I am not really sure how they are getting enough Iodoflex for the large wound area especially on his left calf. Nevertheless the wounds look better than when I saw this in 2017. There were plans for him to have plastic surgery in 2018 and I think he was actually prepped for surgery however he was ultimately denied because the patient was an active smoker. The patient is not systemically unwell. The wounds are painful however given the size especially on the left this is not surprising. ABIs in this clinic were 0.78 on the left and 0.91 on the right 07/13/2018; this is a patient with bilateral severe venous  ulcers with secondary lymphedema. The wounds are on the right medial calf and a substantial part of the left posterior calf and some involvement medially and laterally on the left. We changed him to silver alginate under Unna boot's last time. The wounds actually look fairly satisfactory today. 08/14/2018; this is a patient with severe bilateral venous stasis ulcers with secondary lymphedema left greater than right he is at [facility] using silver alginate under Unna boot's I do not think there is much change on this visit versus last time I saw him a month ago Readmission: 11/04/18 patient presents today for follow-up concerning his bilateral lower extremity ulcers. His a right medial ankle ulcer and a left leg that has ulcers over a large proportion of the surface area between the ankle and knee. Unfortunately this does cause some discomfort for the patient although it doesn't seem to be as uncomfortable as it has been in the past. He is seen today for evaluation after a referral by IKeisha back to the clinic. Unfortunately has a lot of Slough noted on the surface of his wounds. He's been treated currently it sounds like with Dakin's soaked gauze and they have not been performing the Unna Boot wrap that was previously recommended. I'm not exactly sure what the reason for the change was. He does have less slough on the surface of the wound but again this is something that is often a constant issue for him. Again he's had these wounds for many years. I've known him for two years at least during that time when I was taking care of them in the facility he is Artie had the wounds for several years prior. READMISSION 02/25/2020 Mardeen Skiff is a now 68 year old man. He has been in our clinic several times before most extensively in October 2016 through May 2017. At that time he had absolutely substantial bilateral lower extremity wounds secondary to chronic venous insufficiency and lymphedema. We ultimately  referred him to [facility] he had a series of biopsies only showed suggestion of wound secondary to chronic venous insufficiency. He had a less extensive stay in our clinic in 2019 and then a single visit in February 2020. He is a resident at [facility] skilled facility. I think he has had intermittently had in facility wound care. He returns today. They are using silver alginate on the wounds although I am not sure what type of compression. Previously he has favored Unna boots. He is not  felt to have an arterial issue. Past medical history; lymphedema and chronic venous insufficiency, diabetes mellitus, hypertension, congestive heart failure We did not do arterial studies today 04/27/2020 on evaluation patient appears to be doing really about the same as when I have known him and seen him in the past. He does have wounds on the right and left legs at this point. Fortunately there is no signs of active infection at this time. 9/2; 1 month follow-up. This is a man with severe chronic venous insufficiency and lymphedema. When I first met him he had horrible circumferential bilateral lower extremity ulcers. We have been using silver alginate up until early last month when it was changed to Hydrofera Blue and Unna boot's more or less says maintenance dressings. Since the last time he was here the facility where he resides [facility] called to report they could no longer do Unna boot so he is apparently only been receiving kerlix Coban the left leg is certainly a lot worse with almost circumferential large wounds especially lateral but now medial and posterior as well. He has a standard same looking wound on the right medial lower leg. 10/1; absolutely no improvement here. In fact I do not think anything much is changed. The substantial area on his left lateral and left posterior calf is still open may be slightly larger. He has a more modest wound on the right medial calf. None of these look any different.  He comes in with a kerlix Coban wrap this will simply not be adequate. He has too much edema in this leg He also is complaining a lot of pain in his left heel area. 11/8; potential wounds on the left lateral and posterior calf and a smaller oval wound in the right medial. We have been using Hydrofera Blue under 4-layer compression. He was using Unna boots still 2 months ago the facility called to say they can no longer place these so we have been using 4-layer compression. The surface area of the area on the left is so large there is very few alternatives to what we can use on this either silver alginate or Hydrofera Blue. He was complaining of pain on the left heel last time I asked for an x-ray this was apparently done although we do not have the result. He is not complaining of pain today. 08/16/2020 on evaluation today patient is is seen for a early visit due to the fact that he apparently is having issues I was told when they called on Monday with a MRSA infection that they felt like we needed to see him for because his legs looked horrible. With that being said based on what I see at this point it Bovey, Lucille ([id number]) [phone].pdf Page 10 of 13 does not appear that the patient is actually having a terrible infection in fact compared to last time I saw him his legs do not appear to be doing too poorly at all at this time. Nonetheless he has been on doxycycline that is not going to be a good option for him based on the culture report that I have for review today which graded as a partial report with still several organisms pending as far as identification is concerned we did contact them but again they do not have the final report yet. Either way based on what we see it appears that doxycycline is one of the few medications that will not work for the Citrobacter. Obviously I think that a different medication would  be a good option for him since there is a  gram-negative organism pending I am likely can I suggest Cipro as the best of backup antibiotic to switch him to at this point. All this was discussed with the patient today. 12/20; not much change in the substantial wound on the left posterior calf and the small area on the right medial. He has a new area on the left anterior which looks like a wrap injury. Had some thoughts about doing a deep tissue culture here although we will put that off for next time. Using silver alginate as a primary dressing 2/10; substantial area on the left posterior calf. This measures smaller but still is a very large area. He has the area on the right anterior medial which also is a fairly large wound but much smaller than not on the left. His edema control is quite good. We have been using silver alginate because of the size of the wounds. 4/7; substantial wound on the left posterior calf and a smaller area on the right medial lower leg. These are very chronic wounds which we have classified is venous. Previously worked up at [facility] for 5 years ago at which time I had sent and will send him over for consideration of extensive skin graft I know they biopsied this many times but he never had plastic surgery. The wound area is much too large for standard size dressings we have been using silver alginate but we did give him a good trial last fall of Hydrofera Blue that did not make any difference either. If anything the area on the right medial lower leg over time has gotten a lot smaller as has the area on the left although it still quite substantial now. Readmission 10/08/2021 Mr. Carmin Dibartolo is a 68 year old male with a past medical history of insulin -dependent type 2 diabetes with last hemoglobin A1c of 10.2, chronic venous insufficiency, lymphedema and chronic diastolic heart failure that presents to the clinic for bilateral lower extremity wounds. He has been followed in our clinic previously for the past 6 years  for these issues. He was last seen in April 2022. He is inconsistent with his appointments in our clinic. These wounds have not healed over the past few years. He is currently using silver alginate to the wound beds and keeping these covered with Kerlix. He currently denies systemic signs of infection. He was recently hospitalized for septic shock secondary to bacteremia from likely chronic leg wounds And is currently on oral antibiotics to be completed this week. 1/30; patient presents for follow-up. He has no issues or complaints today. 2/6; patient presents for follow-up. He reports more discomfort with the increase in compression wraps. He currently denies systemic signs of infection and feels well overall. 2/13; patient presents for follow-up. He reports tolerating the current leg/Coban wrap well. He denies systemic signs of infection and feels fine overall. 2/21; patient presents for follow-up. He has no issues or complaints today. He tolerated the kirlex/Coban wrap. He denies systemic signs of infection. 3/21; patient presents for follow-up. He has no issues or complaints today. He saw infectious disease, Dr. Elaine. Nothing further to do from an infectious disease point. Levaquin was stopped. He currently denies systemic signs of infection. 4/4; patient presents for follow-up. His facility was able to obtain the [facility] antibiotic and has started using this with dressing changes/wrap changes. Patient has no issues or complaints today. He denies signs of infection. 4/18; patient presents for follow-up. He has no  issues or complaints today. He denies signs of infection. 5/9; patient presents for follow-up. He has been using Keystone antibiotic under Kerlix/Coban. He denies signs of infection today. 6/6; monthly follow-up. Patient is at [facility] skilled facility. He has been using Keystone probably silver alginate under kerlix Coban. We use Sorbact here. Wound condition has improved 7/11;  patient presents for follow-up. We have been using Keystone with Sorbact under Kerlix/Coban. Patient has no issues or complaints today. 8/8; patient presents for follow-up. He is still using Keystone and Sorbact under Kerlix/Coban to his lower extremities bilaterally. He has no issues or complaints today. He denies signs of infection. 9/12; patient presents for follow-up. We have been using Keystone with Sorbact and calcium  alginate under Kerlix/Coban to lower extremities bilaterally. He has no issues or complaints today. 10/10; patient presents for follow-up. We continue to use [facility] with Sorbact and calcium  alginate under Kerlix/Coban. He has tolerated this well and he has no issues or complaints today. 11/14; patient presents for follow-up. We have been using Keystone with Sorbact and calcium  alginate under Kerlix/Coban to the lower extremities bilaterally. He cannot tolerate higher compression. He states that the facility he resides and changes his dressings 3 times a week. He has no issues or complaints today. 12/12; patient presents for follow-up. We have been using Keystone antibiotic ointment with Sorbact and calcium  alginate under Kerlix/Coban to the lower extremities bilaterally. He is open to trying a higher level compression. His ABIs do not suggest significant arterial insufficiency. 1/8; patient presents for follow up. We have been using Keystone antibiotic ointment with Sorbact and calcium  alginate. We increased the compression to 3 layer compression last visit however patient came in with Kerlix/Coban and no sorbact. It is unclear if the patient's facility has been using a higher level compression. We will follow-up with his facility. 12/17/2022 Patient has not been back since January. Patient has bilateral lower extremity wounds. These are the same wounds we have been treating. Facility has been using silver alginate under compression. Patient has no issues or complaints  today. 5/7; this is a patient who lives in [facility] skilled facility. We have been following this man for years with severe bilateral lower extremity ulcers/venous wounds. When he first came to the clinic he had multiple biopsies but they were never particularly diagnostic of any other etiology. The wound area especially on the left calf area is particularly large not well covered by anything that is skilled facility would have except silver alginate. We are using kerlix Coban compression bilaterally. There has been some improvement in the right medial lower leg. There is a fairly large amount of nonviable debris especially on the left 6/18; patient presents for follow-up. He is at [facility] facility and states that the dressings have not been changed in over a week. He does have mild odor on exam. He denies systemic signs of infection. We have been using silver alginate under Kerlix/Coban to the lower extremities bilaterally 6/25; patient presents for follow-up. We have been using Dakin's under compression wrap. His wounds have improved greatly. I did a wound culture at last clinic visit that grew over 13 different bacteria strains with the most prevalent being E. coli and Pseudomonas aeruginosa. Keystone antibiotic spray was ordered and he should be receiving this in the next week. Patient has no issues or complaints today. He denies signs of infection. 7/23; Patient presents for follow up. He has been using keystone antibiotic spray to the wound beds. Today there is  no silver alginate on the wound beds or kerlix/coban compressin wrap. He resides in a facility that changes the dressings several times a week. 9/10; patient presents for follow-up. We have been using Keystone antibiotic ointment with silver alginate under Kerlix/Coban to the lower extremities bilaterally. Wounds have healthier granulation tissue and appear well-healing. He has no issues or complaints. He denies signs of  infection. 10/22; this patient that is been in this clinic for a long period of time. He is also a long-term resident at [facility] skilled facility. He has severe bilateral Meech, Emmauel ([id number]) [id number] Page 11 of 13 lymphedema secondary to chronic venous insufficiency. At 1 point the area on his left lower leg was circumferential extending to a large wound on the right medial and posterior calf. He was completely worked up for this at [facility] including biopsies, lab work etc. no additional diagnoses were found as I recall. We are using silver alginate at the facility with [facility] [place] change 3 times a week. He did not come in with the [facility] today 11/18; 1 month follow-up. Wounds appear better using Keystone silver alginate. He does not come into the clinic with [facility] therefore we use topical gentamicin and silver alginate. 09/30/2023; patient presents for follow-up. He has wounds on his legs bilaterally left greater than right. We have been using antibiotic ointment and silver alginate under compression therapy. He has tolerated this well. He has no issues or complaints today. He resides in a facility that changes the dressings every other day. He denies signs of infection. Objective Constitutional respirations regular, non-labored and within target range for patient.. Vitals Time Taken: 11:02 AM, Weight: 245.6 lbs, Temperature: 98.2 F, Pulse: 67 bpm, Respiratory Rate: 17 breaths/min, Blood Pressure: 148/79 mmHg. Cardiovascular 2+ dorsalis pedis/posterior tibialis pulses. Psychiatric pleasant and cooperative. General Notes: Wounds to the lower extremities bilaterally with granulation tissue and nonviable tissue. No surrounding signs of infection including increased warmth, erythema or purulent drainage. Integumentary (Hair, Skin) Wound #30 status is Open. Original cause of wound was Gradually Appeared. The date acquired was: 09/23/2020. The wound has  been in treatment 41 weeks. The wound is located on the Left,Posterior Lower Leg. The wound measures 13.4cm length x 14.4cm width x 0.3cm depth; 151.55cm^2 area and 45.465cm^3 volume. There is Fat Layer (Subcutaneous Tissue) exposed. There is no tunneling or undermining noted. There is a large amount of serosanguineous drainage noted. There is large (67-100%) red, friable granulation within the wound bed. There is a small (1-33%) amount of necrotic tissue within the wound bed including Adherent Slough. The periwound skin appearance exhibited: Dry/Scaly. The periwound skin appearance did not exhibit: Callus, Crepitus, Excoriation, Induration, Rash, Scarring, Maceration, Atrophie Blanche, Cyanosis, Ecchymosis, Hemosiderin Staining, Mottled, Pallor, Rubor, Erythema. Wound #31 status is Open. Original cause of wound was Gradually Appeared. The date acquired was: 10/31/2022. The wound has been in treatment 41 weeks. The wound is located on the Right,Medial Lower Leg. The wound measures 2.4cm length x 0.9cm width x 0.2cm depth; 1.696cm^2 area and 0.339cm^3 volume. There is Fat Layer (Subcutaneous Tissue) exposed. There is no tunneling or undermining noted. There is a medium amount of serosanguineous drainage noted. The wound margin is distinct with the outline attached to the wound base. There is large (67-100%) red granulation within the wound bed. There is a small (1-33%) amount of necrotic tissue within the wound bed including Adherent Slough. The periwound skin appearance exhibited: Dry/Scaly, Hemosiderin Staining. The periwound skin appearance did not exhibit: Callus, Crepitus, Excoriation, Induration,  Rash, Scarring, Maceration, Atrophie Blanche, Cyanosis, Ecchymosis, Mottled, Pallor, Rubor, Erythema. Assessment Active Problems ICD-10 Chronic venous hypertension (idiopathic) with ulcer and inflammation of bilateral lower extremity Non-pressure chronic ulcer of other part of left lower leg with fat  layer exposed Non-pressure chronic ulcer of unspecified part of right lower leg with fat layer exposed Lymphedema, not elsewhere classified Moderate protein-calorie malnutrition Type 2 diabetes mellitus with other skin ulcer Chronic diastolic (congestive) heart failure Patient's lower extremity wounds are stable. I debrided nonviable tissue. He is doing well with antibiotic ointment and silver alginate under compression therapy. I recommended continuing this. Procedures Wound #30 Pre-procedure diagnosis of Wound #30 is a Venous Leg Ulcer located on the Left,Posterior Lower Leg .Severity of Tissue Pre Debridement is: Fat layer exposed. There was a Excisional Skin/Subcutaneous Tissue Debridement with a total area of 151.47 sq cm performed by Rosan Raisin, DO. With the following instrument(s): Curette to remove Viable and Non-Viable tissue/material. Material removed includes Subcutaneous Tissue and Slough and. No specimens were taken. A time out was conducted at 11:25, prior to the start of the procedure. A Minimum amount of bleeding was controlled with Pressure. The procedure was tolerated well. Post Debridement Measurements: 13.4cm length x 14.4cm width x 0.3cm depth; 45.465cm^3 volume. Character of Wound/Ulcer Post Debridement requires further debridement. Severity of Tissue Post Debridement is: Fat layer exposed. Post procedure Diagnosis Wound #30: Same as Pre-Procedure Koskinen, Kewan ([id number]) [id number] Page 12 of 13 Wound #31 Pre-procedure diagnosis of Wound #31 is a Venous Leg Ulcer located on the Right,Medial Lower Leg .Severity of Tissue Pre Debridement is: Fat layer exposed. There was a Excisional Skin/Subcutaneous Tissue Debridement with a total area of 1.7 sq cm performed by Rosan Raisin, DO. With the following instrument(s): Curette to remove Viable and Non-Viable tissue/material. Material removed includes Subcutaneous Tissue and Slough and  after achieving pain control using Lidocaine  4% Topical Solution. No specimens were taken. A time out was conducted at 11:25, prior to the start of the procedure. A Minimum amount of bleeding was controlled with Pressure. The procedure was tolerated well. Post Debridement Measurements: 2.4cm length x 0.9cm width x 0.1cm depth; 0.17cm^3 volume. Character of Wound/Ulcer Post Debridement requires further debridement. Severity of Tissue Post Debridement is: Fat layer exposed. Post procedure Diagnosis Wound #31: Same as Pre-Procedure Plan Follow-up Appointments: Return Appointment in: - Dr. Rufus 2 months *** **** Other: - [facility] *********PLEASE WRAP BOTH LEGS WITH KERLIX AND COBAN FROM BASE OF TOES TO JUST BELOW KNEE FOR COMPRESSION.****** Next dressing change should be Thursday at the assisted living facility. Anesthetic: (In clinic) Topical Lidocaine  4% applied to wound bed Bathing/ Shower/ Hygiene: May shower with protection but do not get wound dressing(s) wet. Protect dressing(s) with water repellant cover (for example, large plastic bag) or a cast cover and may then take shower. Non Wound Condition: Other Non Wound Condition Orders/Instructions: - in clinic only applied gentamicin and bactroban  mixed and applied to wound bed. WOUND #30: - Lower Leg Wound Laterality: Left, Posterior Cleanser: Soap and Water 3 x Per Week/30 Days Discharge Instructions: May shower and wash wound with dial antibacterial soap and water prior to dressing change. Topical: Gentamicin 3 x Per Week/30 Days Discharge Instructions: As directed by physician Topical: Triamcinolone 3 x Per Week/30 Days Discharge Instructions: Apply Triamcinolone as directed Topical: topical compounding antibiotics from [facility] 3 x Per Week/30 Days Discharge Instructions: Spray then apply calcium  alginate Ag. Prim Dressing: Maxorb Extra Ag+ Alginate Dressing, 4x4.75 (in/in) 3 x Per Week/30  Days ary Discharge Instructions: Apply over the antibiotic spray ****once the topical compounding antibiotics arrive. Secondary Dressing: ABD Pad, 8x10 3 x Per Week/30 Days Discharge Instructions: Apply over primary dressing as directed. Secured With: [employer], 4.5x3.1 (in/yd) 3 x Per Week/30 Days Discharge Instructions: Secure with Kerlix as directed. Com pression Wrap: Kerlix Roll 4.5x3.1 (in/yd) 3 x Per Week/30 Days Discharge Instructions: Apply Kerlix and Coban compression as directed. Com pression Wrap: Coban Self-Adherent Wrap 4x5 (in/yd) 3 x Per Week/30 Days Discharge Instructions: Apply over Kerlix as directed. WOUND #31: - Lower Leg Wound Laterality: Right, Medial Cleanser: Soap and Water Every Other Day/30 Days Discharge Instructions: May shower and wash wound with dial antibacterial soap and water prior to dressing change. Peri-Wound Care: Triamcinolone 15 (g) Every Other Day/30 Days Discharge Instructions: Use triamcinolone 15 (g) as directed Topical: topical compounding antibiotics from [facility] Every Other Day/30 Days Discharge Instructions: Spray then apply calcium  alginate Ag. Prim Dressing: Maxorb Extra Ag+ Alginate Dressing, 4x4.75 (in/in) Every Other Day/30 Days ary Discharge Instructions: Apply over the antibiotic spray ****once the topical compounding antibiotics arrive. Secondary Dressing: ABD Pad, 8x10 Every Other Day/30 Days Discharge Instructions: Apply over primary dressing as directed. Secured With: [employer], 4.5x3.1 (in/yd) Every Other Day/30 Days Discharge Instructions: Secure with Kerlix as directed. Com pression Wrap: Kerlix Roll 4.5x3.1 (in/yd) Every Other Day/30 Days Discharge Instructions: Apply Kerlix and Coban compression as directed. Com pression Wrap: Coban Self-Adherent Wrap 4x5 (in/yd) Every Other Day/30 Days Discharge Instructions: Apply over Kerlix as directed. 1. In office sharp debridement 2. Antibiotic ointment  with silver alginate under Kerlix/Coban to the lower extremities bilaterally 3. Follow-up in 1 week Electronic Signature(s) Signed: 10/01/2023 5:26:35 PM By: Rosan Raisin DO Entered By: Rosan Raisin on 09/30/2023 13:34:31 Salvato, Creg ([id number]) [id number] Page 13 of 13 -------------------------------------------------------------------------------- SuperBill Details [name], [name] 09/30/2023 Medical Record Number: [id number] Patient Account Number: [id number] Date of Birth/Sex: Treating RN: 1956-02-26 (68 y.o. M) Primary Care Provider: Rexanne Ingle Other Clinician: Referring Provider: Treating Provider/Extender: Rosan Raisin Rexanne Ingle Devra in Treatment: 41 Diagnosis Coding ICD-10 Codes Code Description [phone] Chronic venous hypertension (idiopathic) with ulcer and inflammation of bilateral lower extremity 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ed L97.912 Non-pressure chronic ulcer of unspecified part of right lower leg with fat layer exposed I89.0 Lymphedema, not elsewhere classified E44.0 Moderate protein-calorie malnutrition E11.622 Type 2 diabetes mellitus with other skin ulcer I50.32 Chronic diastolic (congestive) heart failure Facility Procedures : CPT4 Code: [id number] Description: 11042 - DEB SUBQ TISSUE 20 SQ CM/< ICD-10 Diagnosis Description 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ed L97.912 Non-pressure chronic ulcer of unspecified part of right lower leg with fat  layer I87.333 Chronic venous hypertension (idiopathic) with ulcer and inflammation of bilateral E11.622 Type 2 diabetes mellitus with other skin ulcer Modifier: exposed lower extremity Quantity: 1 : CPT4 Code: [id number] Description: 11045 - DEB SUBQ TISS EA ADDL 20CM ICD-10 Diagnosis Description L97.822 Non-pressure chronic ulcer of other part  of left lower leg with fat layer exposed L97.912 Non-pressure chronic ulcer of unspecified part of right lower leg with fat  layer I87.333 Chronic venous hypertension (idiopathic) with ulcer and inflammation of bilateral E11.622 Type 2 diabetes mellitus with other skin ulcer Modifier: exposed lower extremity Quantity: 7 Physician Procedures : CPT4 Code Description Modifier [id number] 11042 - WC PHYS SUBQ TISS 20 SQ CM ICD-10  Diagnosis Description 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ed L97.912 Non-pressure chronic ulcer of unspecified part of  right lower leg with fat layer exposed I87.333 Chronic venous hypertension (idiopathic) with ulcer and inflammation of bilateral lower extremity E11.622 Type 2 diabetes mellitus with other skin ulcer Quantity: 1 : [id number] 11045 - WC PHYS SUBQ TISS EA ADDL 20 CM ICD-10 Diagnosis Description 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ed L97.912 Non-pressure chronic ulcer of unspecified part of right lower leg with fat  layer exposed I87.333 Chronic venous hypertension (idiopathic) with ulcer and inflammation of bilateral lower extremity E11.622 Type 2 diabetes mellitus with other skin ulcer Quantity: 7 Electronic Signature(s) Signed: 10/01/2023 5:26:35 PM By: Rosan Raisin DO Entered By: Rosan Raisin on 09/30/2023 13:34:37

## 2023-10-02 NOTE — Progress Notes (Signed)
 [name], [name] ([id number]) [id number] Page 1 of 10 Visit Report for 09/30/2023 Arrival Information Details Patient Name: Date of Service: [name], [name] 09/30/2023 10:30 [name] M Medical Record Number: [id number] Patient Account Number: [id number] Date of Birth/Sex: Treating RN: 1956-08-15 (68 y.o. M) Zochol, Jamie Primary Care Emmaclaire Switala: Rexanne Ingle Other Clinician: Referring Kriston Mckinnie: Treating Gail Creekmore/Extender: Rosan Harlene Rexanne Ingle Devra in Treatment: 23 Visit Information History Since Last Visit Added or deleted any medications: No Patient Arrived: Wheel Chair Any new allergies or adverse reactions: No Arrival Time: 11:01 Had [name] fall or experienced change in No Accompanied By: CNA activities of daily living that may affect Transfer Assistance: None risk of falls: Patient Identification Verified: Yes Signs or symptoms of abuse/neglect since last visito No Secondary Verification Process Completed: Yes Hospitalized since last visit: No Patient Requires Transmission-Based Precautions: No Implantable device outside of the clinic excluding No Patient Has Alerts: No cellular tissue based products placed in the center since last visit: Has Compression in Place as Prescribed: Yes Pain Present Now: No Notes Pt stayed in wheelchair during dressing change Electronic Signature(s) Signed: 09/30/2023 4:32:03 PM By: Zochol, Jamie RN Entered By: Zochol, Jamie on 09/30/2023 08:04:01 -------------------------------------------------------------------------------- Encounter Discharge Information Details Patient Name: Date of Service: [name], [name] 09/30/2023 10:30 [name] M Medical Record Number: [id number] Patient Account Number: [id number] Date of Birth/Sex: Treating RN: 11/11/1955 (68 y.o. M) Zochol, Jamie Primary Care Irelynn Schermerhorn: Rexanne Ingle Other Clinician: Referring Sasha Rueth: Treating Tavoris Brisk/Extender: Rosan Harlene Rexanne Ingle Devra in Treatment:  59 Encounter Discharge Information Items Post Procedure Vitals Discharge Condition: Stable Temperature (F): 98.2 Ambulatory Status: Wheelchair Pulse (bpm): 67 Discharge Destination: Skilled Nursing Facility Respiratory Rate (breaths/min): 14 Telephoned: No Blood Pressure (mmHg): 148/79 Orders Sent: Yes Transportation: Private Auto Accompanied By: CNA Schedule Follow-up Appointment: Yes Clinical Summary of Care: Electronic Signature(s) Signed: 09/30/2023 4:32:03 PM By: Zochol, Jamie RN Entered By: Zochol, Jamie on 09/30/2023 08:46:38 [name], [name] ([id number]) [id number] Page 2 of 10 -------------------------------------------------------------------------------- Lower Extremity Assessment Details Patient Name: Date of Service: [name], [name] 09/30/2023 10:30 [name] M Medical Record Number: [id number] Patient Account Number: [id number] Date of Birth/Sex: Treating RN: [birth year] (68 y.o. M) Zochol, Jamie Primary Care Nishaan Stanke: Rexanne Ingle Other Clinician: Referring Elira Colasanti: Treating Aryaan Persichetti/Extender: Rosan Harlene Rexanne Ingle Devra in Treatment: 41 Edema Assessment Assessed: Colletta: No] [Right: No] Edema: [Left: Yes] [Right: Yes] Calf Left: Right: Point of Measurement: From Medial Instep 39.4 cm 40.2 cm Ankle Left: Right: Point of Measurement: From Medial Instep 22.4 cm 22.6 cm Vascular Assessment Pulses: Dorsalis Pedis Palpable: [Left:Yes] [Right:Yes] Extremity colors, hair growth, and conditions: Extremity Color: [Left:Normal] [Right:Normal] Hair Growth on Extremity: [Left:No] [Right:No] Temperature of Extremity: [Left:Cool] [Right:Cool] Capillary Refill: [Left:< 3 seconds] [Right:< 3 seconds] Dependent Rubor: [Left:No No] [Right:No No] Electronic Signature(s) Signed: 09/30/2023 4:32:03 PM By: Zochol, Jamie RN Entered By: Zochol, Jamie on 09/30/2023  08:06:46 -------------------------------------------------------------------------------- Multi Wound Chart Details Patient Name: Date of Service: [name], [name] 09/30/2023 10:30 [name] M Medical Record Number: [id number] Patient Account Number: [id number] Date of Birth/Sex: Treating RN: 28-Apr-1956 (68 y.o. M) Primary Care Cataleyah Colborn: Rexanne Ingle Other Clinician: Referring Romey Mathieson: Treating Tomi Paddock/Extender: Rosan Harlene Rexanne Ingle Devra in Treatment: 41 Vital Signs Height(in): Pulse(bpm): 67 Weight(lbs): 245.6 Blood Pressure(mmHg): 148/79 Body Mass Index(BMI): Temperature(F): 98.2 Respiratory Rate(breaths/min): 17 [30:Photos:] [[name]:[name] [id number] Page 3 of 10] Left, Posterior Lower Leg Right, Medial Lower Leg [name] Wound Location: Gradually Appeared Gradually Appeared [name] Wounding Event: Venous Leg Ulcer Venous Leg Ulcer [name] Primary Etiology: Anemia, Chronic Obstructive  Anemia, Chronic Obstructive [name] Comorbid History: Pulmonary Disease (COPD), Sleep Pulmonary Disease (COPD), Sleep Apnea, Congestive Heart Failure, Apnea, Congestive Heart Failure, Coronary Artery Disease, Coronary Artery Disease, Hypertension, Peripheral Venous Hypertension, Peripheral Venous Disease, Type II Diabetes, Disease, Type II Diabetes, Osteoarthritis, Neuropathy Osteoarthritis, Neuropathy 09/23/2020 10/31/2022 [name] Date Acquired: 39 41 [name] Weeks of Treatment: Open Open [name] Wound Status: No No [name] Wound Recurrence: 13.4x14.4x0.3 2.4x0.9x0.2 [name] Measurements L x W x D (cm) 151.55 1.696 [name] [name] (cm) : rea 45.465 0.339 [name] Volume (cm) : 64.40% 64.00% [name] % Reduction in [name] rea: 46.60% 64.00% [name] % Reduction in Volume: Full Thickness Without Exposed Full Thickness Without Exposed [name] Classification: Support Structures Support Structures Large Medium [name] Exudate [name] mount: Serosanguineous Serosanguineous [name] Exudate Type: red, brown red, brown [name] Exudate Color: [name]  Distinct, outline attached [name] Wound Margin: Large (67-100%) Large (67-100%) [name] Granulation [name] mount: Red, Friable Red [name] Granulation Quality: Small (1-33%) Small (1-33%) [name] Necrotic [name] mount: Fat Layer (Subcutaneous Tissue): Yes Fat Layer (Subcutaneous Tissue): Yes [name] Exposed Structures: Fascia: No Fascia: No Tendon: No Tendon: No Muscle: No Muscle: No Joint: No Joint: No Bone: No Bone: No Small (1-33%) Large (67-100%) [name] Epithelialization: Debridement - Excisional Debridement - Excisional [name] Debridement: Pre-procedure Verification/Time Out 11:25 11:25 [name] Taken: [name] Lidocaine  4% Topical Solution [name] Pain Control: Subcutaneous, Slough Subcutaneous, Slough [name] Tissue Debrided: Skin/Subcutaneous Tissue Skin/Subcutaneous Tissue [name] Level: 151.47 1.7 [name] Debridement [name] (sq cm): rea Curette Curette [name] Instrument: Minimum Minimum [name] Bleeding: Pressure Pressure [name] Hemostasis [name] chieved: Procedure was tolerated well Procedure was tolerated well [name] Debridement Treatment Response: 13.4x14.4x0.3 2.4x0.9x0.1 [name] Post Debridement Measurements L x W x D (cm) 45.465 0.17 [name] Post Debridement Volume: (cm) Excoriation: No Excoriation: No [name] Periwound Skin Texture: Induration: No Induration: No Callus: No Callus: No Crepitus: No Crepitus: No Rash: No Rash: No Scarring: No Scarring: No Dry/Scaly: Yes Dry/Scaly: Yes [name] Periwound Skin Moisture: Maceration: No Maceration: No Atrophie Blanche: No Hemosiderin Staining: Yes [name] Periwound Skin Color: Cyanosis: No Atrophie Blanche: No Ecchymosis: No Cyanosis: No Erythema: No Ecchymosis: No Hemosiderin Staining: No Erythema: No Mottled: No Mottled: No Pallor: No Pallor: No Rubor: No Rubor: No Debridement Debridement [name] Procedures Performed: Treatment Notes Wound #30 (Lower Leg) Wound Laterality: Left, Posterior Cleanser Soap and Water Discharge Instruction: May shower and wash wound with dial  antibacterial soap and water prior to dressing change. Peri-Wound Care [name], [name] ([id number]) [id number] Page 4 of 10 Topical Triamcinolone Discharge Instruction: Apply Triamcinolone as directed topical compounding antibiotics from [facility] Discharge Instruction: Spray then apply calcium  alginate Ag. Primary Dressing Maxorb Extra Ag+ Alginate Dressing, 4x4.75 (in/in) Discharge Instruction: Apply over the antibiotic spray ****once the topical compounding antibiotics arrive. Secondary Dressing ABD Pad, 8x10 Discharge Instruction: Apply over primary dressing as directed. Secured With [employer], 4.5x3.1 (in/yd) Discharge Instruction: Secure with Kerlix as directed. Compression Wrap Kerlix Roll 4.5x3.1 (in/yd) Discharge Instruction: Apply Kerlix and Coban compression as directed. Coban Self-Adherent Wrap 4x5 (in/yd) Discharge Instruction: Apply over Kerlix as directed. Compression Stockings Add-Ons Wound #31 (Lower Leg) Wound Laterality: Right, Medial Cleanser Soap and Water Discharge Instruction: May shower and wash wound with dial antibacterial soap and water prior to dressing change. Peri-Wound Care Triamcinolone 15 (g) Discharge Instruction: Use triamcinolone 15 (g) as directed Topical topical compounding antibiotics from [facility] Discharge Instruction: Spray then apply calcium  alginate Ag. Primary Dressing Maxorb Extra Ag+ Alginate Dressing, 4x4.75 (in/in) Discharge Instruction: Apply over the antibiotic spray ****once the topical compounding  antibiotics arrive. Secondary Dressing ABD Pad, 8x10 Discharge Instruction: Apply over primary dressing as directed. Secured With [employer], 4.5x3.1 (in/yd) Discharge Instruction: Secure with Kerlix as directed. Compression Wrap Kerlix Roll 4.5x3.1 (in/yd) Discharge Instruction: Apply Kerlix and Coban compression as directed. Coban Self-Adherent Wrap 4x5  (in/yd) Discharge Instruction: Apply over Kerlix as directed. Compression Stockings Add-Ons Electronic Signature(s) Signed: 10/01/2023 5:26:35 PM By: Rosan Raisin DO Entered By: Rosan Raisin on 09/30/2023 10:14:10 [name], [name] ([id number]) [id number] Page 5 of 10 -------------------------------------------------------------------------------- Multi-Disciplinary Care Plan Details Patient Name: Date of Service: [name], [name] 09/30/2023 10:30 [name] M Medical Record Number: [id number] Patient Account Number: [id number] Date of Birth/Sex: Treating RN: Jan 05, 1956 (68 y.o. M) Zochol, Jamie Primary Care Emett Stapel: Rexanne Ingle Other Clinician: Referring Shailee Foots: Treating Georgeanne Frankland/Extender: Rosan Raisin Rexanne Ingle Devra in Treatment: 29 Active Inactive Pain, Acute or Chronic Nursing Diagnoses: Pain, acute or chronic: actual or potential Potential alteration in comfort, pain Goals: Patient will verbalize adequate pain control and receive pain control interventions during procedures as needed Date Initiated: 12/17/2022 Target Resolution Date: 11/21/2023 Goal Status: Active Interventions: Encourage patient to take pain medications as prescribed Provide education on pain management Reposition patient for comfort Treatment Activities: Administer pain control measures as ordered : 12/17/2022 Notes: Wound/Skin Impairment Nursing Diagnoses: Knowledge deficit related to ulceration/compromised skin integrity Goals: Patient/caregiver will verbalize understanding of skin care regimen Date Initiated: 12/17/2022 Target Resolution Date: 11/20/2023 Goal Status: Active Interventions: Assess patient/caregiver ability to perform ulcer/skin care regimen upon admission and as needed Assess ulceration(s) every visit Provide education on ulcer and skin care Treatment Activities: Skin care regimen initiated : 12/17/2022 Topical wound management initiated :  12/17/2022 Notes: Electronic Signature(s) Signed: 09/30/2023 4:32:03 PM By: Zochol, Jamie RN Entered By: Zochol, Jamie on 09/30/2023 08:17:51 -------------------------------------------------------------------------------- Pain Assessment Details Patient Name: Date of Service: [name], [name] 09/30/2023 10:30 [name] M [name], [name] ([id number]) [id number] Page 6 of 10 Medical Record Number: [id number] Patient Account Number: [id number] Date of Birth/Sex: Treating RN: 1956/01/06 (68 y.o. M) Zochol, Jamie Primary Care Florestine Carmical: Rexanne Ingle Other Clinician: Referring Tequlia Gonsalves: Treating Destiny Trickey/Extender: Rosan Raisin Rexanne Ingle Devra in Treatment: 56 Active Problems Location of Pain Severity and Description of Pain Patient Has Paino Yes Site Locations Pain Location: Generalized Pain Rate the pain. Current Pain Level: 3 Character of Pain Describe the Pain: Aching Pain Management and Medication Current Pain Management: Electronic Signature(s) Signed: 09/30/2023 4:32:03 PM By: Zochol, Jamie RN Entered By: Zochol, Jamie on 09/30/2023 08:04:44 -------------------------------------------------------------------------------- Patient/Caregiver Education Details Patient Name: Date of Service: DERL [name], [name] 1/7/2025andnbsp10:30 [name] M Medical Record Number: [id number] Patient Account Number: [id number] Date of Birth/Gender: Treating RN: 14-Dec-1955 (68 y.o. M) Zochol, Jamie Primary Care Physician: Rexanne Ingle Other Clinician: Referring Physician: Treating Physician/Extender: Rosan Raisin Rexanne Ingle Devra in Treatment: 37 Education Assessment Education Provided To: Patient Education Topics Provided Pain: Methods: Explain/Verbal Responses: Reinforcements needed, State content correctly Wound/Skin Impairment: Methods: Explain/Verbal Responses: Reinforcements needed, State content correctly [name], [name] ([id number]) [id number]  Page 7 of 10 Electronic Signature(s) Signed: 09/30/2023 4:32:03 PM By: Zochol, Jamie RN Entered By: Zochol, Jamie on 09/30/2023 08:18:21 -------------------------------------------------------------------------------- Wound Assessment Details Patient Name: Date of Service: [name], [name] 09/30/2023 10:30 [name] M Medical Record Number: [id number] Patient Account Number: [id number] Date of Birth/Sex: Treating RN: October 22, 1955 (68 y.o. M) Zochol, Jamie Primary Care Mahari Vankirk: Rexanne Ingle Other Clinician: Referring Sid Greener: Treating Andie Mortimer/Extender: Rosan Raisin Rexanne Ingle Devra in Treatment: 41 Wound Status Wound Number: 30 Primary Venous Leg Ulcer Etiology: Wound Location:  Left, Posterior Lower Leg Wound Open Wounding Event: Gradually Appeared Status: Date Acquired: 09/23/2020 Comorbid Anemia, Chronic Obstructive Pulmonary Disease (COPD), Sleep Weeks Of Treatment: 41 History: Apnea, Congestive Heart Failure, Coronary Artery Disease, Clustered Wound: No Hypertension, Peripheral Venous Disease, Type II Diabetes, Osteoarthritis, Neuropathy Photos Wound Measurements Length: (cm) 13.4 Width: (cm) 14.4 Depth: (cm) 0.3 Area: (cm) 151.55 Volume: (cm) 45.465 % Reduction in Area: 64.4% % Reduction in Volume: 46.6% Epithelialization: Small (1-33%) Tunneling: No Undermining: No Wound Description Classification: Full Thickness Without Exposed Suppor Exudate Amount: Large Exudate Type: Serosanguineous Exudate Color: red, brown t Structures Foul Odor After Cleansing: No Slough/Fibrino Yes Wound Bed Granulation Amount: Large (67-100%) Exposed Structure Granulation Quality: Red, Friable Fascia Exposed: No Necrotic Amount: Small (1-33%) Fat Layer (Subcutaneous Tissue) Exposed: Yes Necrotic Quality: Adherent Slough Tendon Exposed: No Muscle Exposed: No Joint Exposed: No Bone Exposed: No Periwound Skin Texture Texture Color No Abnormalities Noted: No No Abnormalities Noted:  No Callus: No Atrophie Blanche: No Crepitus: No Cyanosis: No Excoriation: No Ecchymosis: No Induration: No Erythema: No [name], [name] ([id number]) [id number] Page 8 of 10 Rash: No Hemosiderin Staining: No Scarring: No Mottled: No Pallor: No Moisture Rubor: No No Abnormalities Noted: No Dry / Scaly: Yes Maceration: No Treatment Notes Wound #30 (Lower Leg) Wound Laterality: Left, Posterior Cleanser Soap and Water Discharge Instruction: May shower and wash wound with dial antibacterial soap and water prior to dressing change. Peri-Wound Care Topical Triamcinolone Discharge Instruction: Apply Triamcinolone as directed topical compounding antibiotics from [facility] Discharge Instruction: Spray then apply calcium  alginate Ag. Primary Dressing Maxorb Extra Ag+ Alginate Dressing, 4x4.75 (in/in) Discharge Instruction: Apply over the antibiotic spray ****once the topical compounding antibiotics arrive. Secondary Dressing ABD Pad, 8x10 Discharge Instruction: Apply over primary dressing as directed. Secured With [employer], 4.5x3.1 (in/yd) Discharge Instruction: Secure with Kerlix as directed. Compression Wrap Kerlix Roll 4.5x3.1 (in/yd) Discharge Instruction: Apply Kerlix and Coban compression as directed. Coban Self-Adherent Wrap 4x5 (in/yd) Discharge Instruction: Apply over Kerlix as directed. Compression Stockings Add-Ons Electronic Signature(s) Signed: 09/30/2023 4:32:03 PM By: Zochol, Jamie RN Entered By: Zochol, Jamie on 09/30/2023 08:15:38 -------------------------------------------------------------------------------- Wound Assessment Details Patient Name: Date of Service: [name], [name] 09/30/2023 10:30 [name] M Medical Record Number: [id number] Patient Account Number: [id number] Date of Birth/Sex: Treating RN: [birth year] (67 y.o. M) Zochol, Jamie Primary Care Avon Mergenthaler: Rexanne Ingle Other Clinician: Referring Arliss Hepburn: Treating  Annlee Glandon/Extender: Rosan Harlene Rexanne Ingle Devra in Treatment: 41 Wound Status Wound Number: 31 Primary Venous Leg Ulcer Etiology: Wound Location: Right, Medial Lower Leg Wound Open Wounding Event: Gradually Appeared Status: Date Acquired: 10/31/2022 Comorbid Anemia, Chronic Obstructive Pulmonary Disease (COPD), Sleep Weeks Of Treatment: 41 History: Apnea, Congestive Heart Failure, Coronary Artery Disease, Clustered Wound: No Hypertension, Peripheral Venous Disease, Type II Diabetes, Osteoarthritis, Neuropathy [name], [name] ([id number]) [id number] Page 9 of 10 Photos Wound Measurements Length: (cm) 2.4 Width: (cm) 0.9 Depth: (cm) 0.2 Area: (cm) 1.696 Volume: (cm) 0.339 % Reduction in Area: 64% % Reduction in Volume: 64% Epithelialization: Large (67-100%) Tunneling: No Undermining: No Wound Description Classification: Full Thickness Without Exposed Support Structures Wound Margin: Distinct, outline attached Exudate Amount: Medium Exudate Type: Serosanguineous Exudate Color: red, brown Foul Odor After Cleansing: No Slough/Fibrino No Wound Bed Granulation Amount: Large (67-100%) Exposed Structure Granulation Quality: Red Fascia Exposed: No Necrotic Amount: Small (1-33%) Fat Layer (Subcutaneous Tissue) Exposed: Yes Necrotic Quality: Adherent Slough Tendon Exposed: No Muscle Exposed: No Joint Exposed: No Bone Exposed: No Periwound Skin Texture Texture Color No Abnormalities Noted: No  No Abnormalities Noted: No Callus: No Atrophie Blanche: No Crepitus: No Cyanosis: No Excoriation: No Ecchymosis: No Induration: No Erythema: No Rash: No Hemosiderin Staining: Yes Scarring: No Mottled: No Pallor: No Moisture Rubor: No No Abnormalities Noted: No Dry / Scaly: Yes Maceration: No Treatment Notes Wound #31 (Lower Leg) Wound Laterality: Right, Medial Cleanser Soap and Water Discharge Instruction: May shower and wash wound with  dial antibacterial soap and water prior to dressing change. Peri-Wound Care Triamcinolone 15 (g) Discharge Instruction: Use triamcinolone 15 (g) as directed Topical topical compounding antibiotics from [facility] Discharge Instruction: Spray then apply calcium  alginate Ag. Primary Dressing Maxorb Extra Ag+ Alginate Dressing, 4x4.75 (in/in) Discharge Instruction: Apply over the antibiotic spray ****once the topical compounding antibiotics arrive. Secondary Dressing [name], [name] ([id number]) [id number] Page 10 of 10 ABD Pad, 8x10 Discharge Instruction: Apply over primary dressing as directed. Secured With [employer], 4.5x3.1 (in/yd) Discharge Instruction: Secure with Kerlix as directed. Compression Wrap Kerlix Roll 4.5x3.1 (in/yd) Discharge Instruction: Apply Kerlix and Coban compression as directed. Coban Self-Adherent Wrap 4x5 (in/yd) Discharge Instruction: Apply over Kerlix as directed. Compression Stockings Add-Ons Electronic Signature(s) Signed: 09/30/2023 4:32:03 PM By: Zochol, Jamie RN Entered By: Zochol, Jamie on 09/30/2023 08:16:19 -------------------------------------------------------------------------------- Vitals Details Patient Name: Date of Service: [name], [name] 09/30/2023 10:30 [name] M Medical Record Number: [id number] Patient Account Number: [id number] Date of Birth/Sex: Treating RN: 08/12/1956 (68 y.o. M) Zochol, Jamie Primary Care Junette Bernat: Rexanne Ingle Other Clinician: Referring Adam Sanjuan: Treating Evonne Rinks/Extender: Rosan Harlene Rexanne Ingle Devra in Treatment: 57 Vital Signs Time Taken: 11:02 Temperature (F): 98.2 Weight (lbs): 245.6 Pulse (bpm): 67 Respiratory Rate (breaths/min): 17 Blood Pressure (mmHg): 148/79 Reference Range: 80 - 120 mg / dl Electronic Signature(s) Signed: 09/30/2023 4:32:03 PM By: Zochol, Jamie RN Entered By: Zochol, Jamie on 09/30/2023 08:04:25

## 2023-10-06 DIAGNOSIS — E1159 Type 2 diabetes mellitus with other circulatory complications: Secondary | ICD-10-CM | POA: Diagnosis not present

## 2023-10-06 DIAGNOSIS — L97929 Non-pressure chronic ulcer of unspecified part of left lower leg with unspecified severity: Secondary | ICD-10-CM | POA: Diagnosis not present

## 2023-10-06 DIAGNOSIS — L97919 Non-pressure chronic ulcer of unspecified part of right lower leg with unspecified severity: Secondary | ICD-10-CM | POA: Diagnosis not present

## 2023-10-06 DIAGNOSIS — I5042 Chronic combined systolic (congestive) and diastolic (congestive) heart failure: Secondary | ICD-10-CM | POA: Diagnosis not present

## 2023-10-20 DIAGNOSIS — I10 Essential (primary) hypertension: Secondary | ICD-10-CM | POA: Diagnosis not present

## 2023-10-20 DIAGNOSIS — E119 Type 2 diabetes mellitus without complications: Secondary | ICD-10-CM | POA: Diagnosis not present

## 2023-10-20 DIAGNOSIS — E118 Type 2 diabetes mellitus with unspecified complications: Secondary | ICD-10-CM | POA: Diagnosis not present

## 2023-11-10 DIAGNOSIS — E119 Type 2 diabetes mellitus without complications: Secondary | ICD-10-CM | POA: Diagnosis not present

## 2023-11-11 DIAGNOSIS — D649 Anemia, unspecified: Secondary | ICD-10-CM | POA: Diagnosis not present

## 2023-11-11 DIAGNOSIS — E1165 Type 2 diabetes mellitus with hyperglycemia: Secondary | ICD-10-CM | POA: Diagnosis not present

## 2023-11-18 DIAGNOSIS — F330 Major depressive disorder, recurrent, mild: Secondary | ICD-10-CM | POA: Diagnosis not present

## 2023-11-25 ENCOUNTER — Encounter (HOSPITAL_BASED_OUTPATIENT_CLINIC_OR_DEPARTMENT_OTHER): Payer: Medicare PPO | Attending: Internal Medicine | Admitting: Internal Medicine

## 2023-11-25 DIAGNOSIS — I890 Lymphedema, not elsewhere classified: Secondary | ICD-10-CM | POA: Diagnosis not present

## 2023-11-25 DIAGNOSIS — I87333 Chronic venous hypertension (idiopathic) with ulcer and inflammation of bilateral lower extremity: Secondary | ICD-10-CM | POA: Diagnosis not present

## 2023-11-25 DIAGNOSIS — E11622 Type 2 diabetes mellitus with other skin ulcer: Secondary | ICD-10-CM | POA: Insufficient documentation

## 2023-11-25 DIAGNOSIS — L97912 Non-pressure chronic ulcer of unspecified part of right lower leg with fat layer exposed: Secondary | ICD-10-CM | POA: Diagnosis not present

## 2023-11-25 DIAGNOSIS — L97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ed: Secondary | ICD-10-CM | POA: Diagnosis not present

## 2023-11-25 DIAGNOSIS — E440 Moderate protein-calorie malnutrition: Secondary | ICD-10-CM | POA: Diagnosis not present

## 2023-11-25 DIAGNOSIS — I5032 Chronic diastolic (congestive) heart failure: Secondary | ICD-10-CM | POA: Diagnosis not present

## 2023-12-02 DIAGNOSIS — I872 Venous insufficiency (chronic) (peripheral): Secondary | ICD-10-CM | POA: Diagnosis not present

## 2023-12-02 DIAGNOSIS — I5042 Chronic combined systolic (congestive) and diastolic (congestive) heart failure: Secondary | ICD-10-CM | POA: Diagnosis not present

## 2023-12-02 DIAGNOSIS — E1165 Type 2 diabetes mellitus with hyperglycemia: Secondary | ICD-10-CM | POA: Diagnosis not present

## 2023-12-02 DIAGNOSIS — L97929 Non-pressure chronic ulcer of unspecified part of left lower leg with unspecified severity: Secondary | ICD-10-CM | POA: Diagnosis not present

## 2023-12-16 DIAGNOSIS — M25512 Pain in left shoulder: Secondary | ICD-10-CM | POA: Diagnosis not present

## 2023-12-17 DIAGNOSIS — R9389 Abnormal findings on diagnostic imaging of other specified body structures: Secondary | ICD-10-CM | POA: Diagnosis not present

## 2023-12-17 DIAGNOSIS — L259 Unspecified contact dermatitis, unspecified cause: Secondary | ICD-10-CM | POA: Diagnosis not present

## 2023-12-17 DIAGNOSIS — M1990 Unspecified osteoarthritis, unspecified site: Secondary | ICD-10-CM | POA: Diagnosis not present

## 2023-12-23 DIAGNOSIS — L299 Pruritus, unspecified: Secondary | ICD-10-CM | POA: Diagnosis not present

## 2023-12-26 DIAGNOSIS — J309 Allergic rhinitis, unspecified: Secondary | ICD-10-CM | POA: Diagnosis not present

## 2024-01-06 DIAGNOSIS — Z794 Long term (current) use of insulin: Secondary | ICD-10-CM | POA: Diagnosis not present

## 2024-01-06 DIAGNOSIS — L602 Onychogryphosis: Secondary | ICD-10-CM | POA: Diagnosis not present

## 2024-01-06 DIAGNOSIS — E1151 Type 2 diabetes mellitus with diabetic peripheral angiopathy without gangrene: Secondary | ICD-10-CM | POA: Diagnosis not present

## 2024-01-07 DIAGNOSIS — L299 Pruritus, unspecified: Secondary | ICD-10-CM | POA: Diagnosis not present

## 2024-01-07 DIAGNOSIS — M25512 Pain in left shoulder: Secondary | ICD-10-CM | POA: Diagnosis not present

## 2024-01-07 DIAGNOSIS — B356 Tinea cruris: Secondary | ICD-10-CM | POA: Diagnosis not present

## 2024-01-07 DIAGNOSIS — I509 Heart failure, unspecified: Secondary | ICD-10-CM | POA: Diagnosis not present

## 2024-01-09 DIAGNOSIS — M25512 Pain in left shoulder: Secondary | ICD-10-CM | POA: Diagnosis not present

## 2024-01-09 DIAGNOSIS — R0989 Other specified symptoms and signs involving the circulatory and respiratory systems: Secondary | ICD-10-CM | POA: Diagnosis not present

## 2024-01-09 DIAGNOSIS — R059 Cough, unspecified: Secondary | ICD-10-CM | POA: Diagnosis not present

## 2024-01-12 DIAGNOSIS — M8589 Other specified disorders of bone density and structure, multiple sites: Secondary | ICD-10-CM | POA: Diagnosis not present

## 2024-01-12 DIAGNOSIS — J810 Acute pulmonary edema: Secondary | ICD-10-CM | POA: Diagnosis not present

## 2024-01-12 DIAGNOSIS — M159 Polyosteoarthritis, unspecified: Secondary | ICD-10-CM | POA: Diagnosis not present

## 2024-01-19 DIAGNOSIS — E1165 Type 2 diabetes mellitus with hyperglycemia: Secondary | ICD-10-CM | POA: Diagnosis not present

## 2024-01-21 ENCOUNTER — Encounter (HOSPITAL_BASED_OUTPATIENT_CLINIC_OR_DEPARTMENT_OTHER): Attending: Internal Medicine | Admitting: Internal Medicine

## 2024-01-21 DIAGNOSIS — I87333 Chronic venous hypertension (idiopathic) with ulcer and inflammation of bilateral lower extremity: Secondary | ICD-10-CM | POA: Insufficient documentation

## 2024-01-21 DIAGNOSIS — E11622 Type 2 diabetes mellitus with other skin ulcer: Secondary | ICD-10-CM | POA: Diagnosis not present

## 2024-01-21 DIAGNOSIS — I5032 Chronic diastolic (congestive) heart failure: Secondary | ICD-10-CM | POA: Diagnosis not present

## 2024-01-21 DIAGNOSIS — I890 Lymphedema, not elsewhere classified: Secondary | ICD-10-CM | POA: Diagnosis not present

## 2024-01-21 DIAGNOSIS — L97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ed: Secondary | ICD-10-CM | POA: Diagnosis not present

## 2024-01-21 DIAGNOSIS — E440 Moderate protein-calorie malnutrition: Secondary | ICD-10-CM | POA: Insufficient documentation

## 2024-01-21 DIAGNOSIS — L97912 Non-pressure chronic ulcer of unspecified part of right lower leg with fat layer exposed: Secondary | ICD-10-CM | POA: Insufficient documentation

## 2024-01-22 ENCOUNTER — Other Ambulatory Visit (INDEPENDENT_AMBULATORY_CARE_PROVIDER_SITE_OTHER): Payer: Self-pay

## 2024-01-22 ENCOUNTER — Ambulatory Visit: Admitting: Orthopaedic Surgery

## 2024-01-22 ENCOUNTER — Encounter: Payer: Self-pay | Admitting: Orthopaedic Surgery

## 2024-01-22 DIAGNOSIS — M1612 Unilateral primary osteoarthritis, left hip: Secondary | ICD-10-CM

## 2024-01-22 DIAGNOSIS — M25512 Pain in left shoulder: Secondary | ICD-10-CM

## 2024-01-22 DIAGNOSIS — G8929 Other chronic pain: Secondary | ICD-10-CM | POA: Diagnosis not present

## 2024-01-22 NOTE — Progress Notes (Signed)
 Office Visit Note   Patient: [name]           Date of Birth: [birth year]           MRN: [id number] Visit Date: 01/22/2024              Requested by: Merl Star, MD [address] [place],  [place] [address] PCP: Merl Star, MD   Assessment & Plan: Visit Diagnoses:  1. Chronic left shoulder pain   2. Primary osteoarthritis of left hip     History of Present Illness Vencent Henschen is a 68 year old male with degenerative arthritis who presents with shoulder pain. He is accompanied by an aide from his nursing home.  He has been experiencing shoulder pain for two months, localized to the shoulder socket and worsened by movement. There is no numbness or tingling radiating down the arm, but he sometimes experiences hand stiffness, attributed to neuropathy.  He has degenerative arthritis, particularly affecting his left hip, and uses a walker for very short distances. He has difficulty with balance, especially on his left side, affecting his gait and causing back strain.  He resides in a nursing home and uses a specialized wheelchair. Physical therapy has enabled him to walk approximately 200 feet on good days but it has been several years since he was able to do that.  He has received a cortisone injection in his hip but not in his shoulder. He is not on blood thinners and has no history of heart problems or a pacemaker.  He has been in a nursing home for 8 years.  Physical Exam MUSCULOSKELETAL: Left shoulder forward flexion to 145 degrees with pain. External rotation to 10-15 degrees. Abduction to 75 degrees, limited by chair. Rotator cuff strength intact. Pain in left shoulder joint line with movement.  ambulates in a Geri-chair  Assessment and Plan Degenerative arthritis of the hip Chronic left hip arthritis with significant degeneration, causing mobility issues. Not a candidate for hip replacement due to deconditioning. - Would need to improve his physical  conditioning significantly to improve candidacy for future surgery.  Shoulder arthritis with pain Chronic shoulder pain due to arthritis and bone spurs. No fracture or dislocation. Cortisone injection discussed for pain relief. - Schedule cortisone injection for shoulder joint. - Administer Toradol  injection for immediate pain relief.  Follow-Up Instructions: No follow-ups on file.   Orders:  Orders Placed This Encounter  Procedures   XR Shoulder Left   Subjective: Chief Complaint  Patient presents with   Left Shoulder - Pain    HPI  Review of Systems  Constitutional: Negative.   HENT: Negative.    Eyes: Negative.   Respiratory: Negative.    Cardiovascular: Negative.   Gastrointestinal: Negative.   Endocrine: Negative.   Genitourinary: Negative.   Skin: Negative.   Allergic/Immunologic: Negative.   Neurological: Negative.   Hematological: Negative.   Psychiatric/Behavioral: Negative.    All other systems reviewed and are negative.    Objective: Vital Signs: There were no vitals taken for this visit.  Physical Exam Vitals and nursing note reviewed.  Constitutional:      Appearance: He is well-developed.  HENT:     Head: Normocephalic and atraumatic.  Eyes:     Pupils: Pupils are equal, round, and reactive to light.  Pulmonary:     Effort: Pulmonary effort is normal.  Abdominal:     Palpations: Abdomen is soft.  Musculoskeletal:        General:  Normal range of motion.     Cervical back: Neck supple.  Skin:    General: Skin is warm.  Neurological:     Mental Status: He is alert and oriented to person, place, and time.  Psychiatric:        Behavior: Behavior normal.        Thought Content: Thought content normal.        Judgment: Judgment normal.     Ortho Exam  Specialty Comments:  No specialty comments available.  Imaging: No results found.   PMFS History: Patient Active Problem List   Diagnosis Date Noted   Recurrent cellulitis of lower  extremity 11/21/2021   Overweight (BMI 25.0-29.9) 11/18/2021   Aortic atherosclerosis (HCC) 09/03/2021   Chronic diastolic CHF (congestive heart failure) (HCC) 06/25/2021   Benign prostatic hyperplasia with urinary obstruction 08/29/2020   Overactive bladder 08/29/2020   Impotence 08/29/2020   Urge incontinence of urine 08/29/2020   Chronic respiratory failure with hypoxia (HCC) 04/06/2019   Dyslipidemia 04/06/2019   MDD (major depressive disorder) 04/06/2019   Psychosis (HCC) 04/05/2019   Diastolic dysfunction 01/07/2017   Primary skin malignancy with unknown cell type 12/10/2016   Moderate protein-calorie malnutrition (HCC) 08/21/2016   Chronic left shoulder pain 08/21/2016   Tobacco use disorder 02/23/2016   Chronic venous stasis dermatitis of both lower extremities 02/09/2016   Spondylosis without myelopathy or radiculopathy, lumbar region 12/13/2015   Nonhealing ulcer of left lower extremity (HCC)    Degenerative arthritis of hip 04/25/2015   Microcytic anemia 03/27/2015   Hypertension    DM2 (diabetes mellitus, type 2) (HCC) 03/20/2015   Anemia, iron deficiency 03/20/2015   Past Medical History:  Diagnosis Date   Anemia    Arthritis    "left hip" (04/26/2015)   Cellulitis and abscess of leg hositalized 04/25/2015   left   Chronic diastolic CHF (congestive heart failure) (HCC) 06/25/2021   Chronic hip pain    Depression    GERD (gastroesophageal reflux disease)    Hypercholesterolemia    Hypertension    Pneumonia 1960's X 1   Primary skin malignancy with unknown cell type 12/10/2016   Sleep apnea    "couldn't wear the mask" (04/26/2015)   Thalassemia minor    Type II diabetes mellitus (HCC)     Family History  Problem Relation Age of Onset   Diabetes Mellitus II Mother    Diabetes Mellitus II Father    Diabetes Mellitus II Brother     Past Surgical History:  Procedure Laterality Date   INGUINAL HERNIA REPAIR Right ~ 2004   SHOULDER ARTHROSCOPY W/ ROTATOR CUFF  REPAIR Right ~ 2004   TOENAIL EXCISION Right 1980's X 2   "big toe"   Social History   Occupational History   Not on file  Tobacco Use   Smoking status: Every Day    Current packs/day: 0.12    Average packs/day: 0.1 packs/day for 45.0 years (5.4 ttl pk-yrs)    Types: Cigarettes   Smokeless tobacco: Never  Vaping Use   Vaping status: Never Used  Substance and Sexual Activity   Alcohol  use: No    Comment: 04/26/2015 "I may drink 1/2 glass of wine or a couple beers 1-2 times/month; if that"   Drug use: No    Comment: "stopped all drug use in 2005; S/P SARP program in [place]"   Sexual activity: Never

## 2024-02-03 ENCOUNTER — Other Ambulatory Visit: Payer: Self-pay

## 2024-02-03 ENCOUNTER — Encounter: Payer: Self-pay | Admitting: Sports Medicine

## 2024-02-03 ENCOUNTER — Ambulatory Visit (INDEPENDENT_AMBULATORY_CARE_PROVIDER_SITE_OTHER): Admitting: Sports Medicine

## 2024-02-03 DIAGNOSIS — M25512 Pain in left shoulder: Secondary | ICD-10-CM

## 2024-02-03 DIAGNOSIS — G8929 Other chronic pain: Secondary | ICD-10-CM

## 2024-02-03 DIAGNOSIS — Z794 Long term (current) use of insulin: Secondary | ICD-10-CM

## 2024-02-03 DIAGNOSIS — E1159 Type 2 diabetes mellitus with other circulatory complications: Secondary | ICD-10-CM | POA: Diagnosis not present

## 2024-02-03 DIAGNOSIS — M19012 Primary osteoarthritis, left shoulder: Secondary | ICD-10-CM

## 2024-02-03 DIAGNOSIS — M12812 Other specific arthropathies, not elsewhere classified, left shoulder: Secondary | ICD-10-CM

## 2024-02-03 MED ORDER — METHYLPREDNISOLONE ACETATE 40 MG/ML IJ SUSP
40.0000 mg | INTRAMUSCULAR | Status: AC | PRN
  Administered 2024-02-03: 40 mg via INTRA_ARTICULAR

## 2024-02-03 MED ORDER — LIDOCAINE HCL 1 % IJ SOLN
2.0000 mL | INTRAMUSCULAR | Status: AC | PRN
  Administered 2024-02-03: 2 mL

## 2024-02-03 MED ORDER — BUPIVACAINE HCL 0.25 % IJ SOLN
2.0000 mL | INTRAMUSCULAR | Status: AC | PRN
  Administered 2024-02-03: 2 mL via INTRA_ARTICULAR

## 2024-02-03 NOTE — Progress Notes (Signed)
 [name] - 68 y.o. male MRN [id number]  Date of birth: 1956-04-29  Office Visit Note: Visit Date: 02/03/2024 PCP: Merl Star, MD Referred by: Merl Star, MD  Subjective: Chief Complaint  Patient presents with   Left Shoulder - Pain   HPI: [name] is a pleasant 68 y.o. male who presents today for chronic left shoulder pain.  He is accompanied by his aide from his nursing home today.  He has had left shoulder pain for the last 2-3 months.  He feels this more so over the anterior shoulder, but feels deep within the shoulder joint/socket.  This is worsened by movement.  He has no numbness or tingling in the upper extremity.  He uses a specialized wheelchair and this is his main form of transportation, he has not walked for several years.   He is an insulin -dependent diabetic.  I do check his sugars and adjust his sliding scale insulin  at his nursing home per protocol. In terms of diabetic medication, he is on linagliptin  5 mg daily, metformin 1000 mg ER daily 24 units Tresiba once daily, gabapentin  300 mg 3 times daily for neuropathy, NovoLog  10 units with meals and sliding scale per protocol  - Last A1c was 11/17/21:  Lab Results  Component Value Date   HGBA1C 8.2 (H) 11/17/2021   Pertinent ROS were reviewed with the patient and found to be negative unless otherwise specified above in HPI.   Assessment & Plan: Visit Diagnoses:  1. Chronic left shoulder pain   2. Primary osteoarthritis, left shoulder   3. Rotator cuff arthropathy of left shoulder   4. Type 2 diabetes mellitus with other circulatory complication, with long-term current use of insulin  (HCC)    Plan: Impression is chronic left shoulder pain which I believe is twofold in nature with at least mild arthritic change both within the shoulder joint and the [facility] joint, but also a degree of rotator cuff arthropathy as he has pain with activation of the rotator cuff and movement about the shoulder joint.  He also is a  insulin -dependent type 2 diabetic which complicates his course.  We discussed treatment options, through shared decision making we did proceed with ultrasound-guided intra-articular left shoulder injection, patient tolerated well.  Advised on postinjection protocol.  He may use ice/heat as well as Tylenol  for any postinjection pain.  He is also managed on gabapentin  300 mg 3 times daily for neuropathy, he may continue this as it may give some relief from his shoulder pain.  He will continue to check his sugars at his nursing home per protocol, we did discuss transient rise in glucose given the corticosteroid injection, he will check and adjust SSI over the next few days for this.  He may do PT or OT in his facility for the shoulder if he finds helpful.  I did spend time reviewing his medications and his nursing home notes in his packet today.  Time also spent filling out his consult form that I return to him and his aide upon leaving.  He will follow-up with Dr. Christiane Cowing if for some reason the injection is not helpful.  He also mention having some right elbow pain, he may an appointment for this at his leisure.  Follow-up: Return for F/u with Dr. Christiane Cowing as needed for shoulder/elbow.   Meds & Orders: No orders of the defined types were placed in this encounter.   Orders Placed This Encounter  Procedures   US  Guided Needle Placement - No Linked  Charges     Procedures: Large Joint Inj: L glenohumeral on 02/03/2024 10:44 AM Indications: pain Details: 22 G 3.5 in needle, ultrasound-guided posterior approach Medications: 2 mL lidocaine  1 %; 2 mL bupivacaine 0.25 %; 40 mg methylPREDNISolone acetate 40 MG/ML Outcome: tolerated well, no immediate complications  US -guided glenohumeral joint injection, left shoulder After discussion on risks/benefits/indications, informed verbal consent was obtained. A timeout was then performed. The patient was positioned lying lateral recumbent on examination table. The patient's  shoulder was prepped with betadine and multiple alcohol  swabs and utilizing ultrasound guidance, the patient's glenohumeral joint was identified on ultrasound. Using ultrasound guidance a 22-gauge, 3.5 inch needle with a mixture of 2:2:1 cc's lidocaine :bupivicaine:depomedrol was directed from a lateral to medial direction via in-plane technique into the glenohumeral joint with visualization of appropriate spread of injectate into the joint. Patient tolerated the procedure well without immediate complications.      Procedure, treatment alternatives, risks and benefits explained, specific risks discussed. Consent was given by the patient. Immediately prior to procedure a time out was called to verify the correct patient, procedure, equipment, support staff and site/side marked as required. Patient was prepped and draped in the usual sterile fashion.          Clinical History: No specialty comments available.  He reports that he has been smoking cigarettes. He has a 5.4 pack-year smoking history. He has never used smokeless tobacco. No results for input(s): "HGBA1C", "LABURIC" in the last 8760 hours.  Objective:    Physical Exam  Gen: Well-appearing, in no acute distress; non-toxic CV: Well-perfused. Warm.  Resp: Breathing unlabored on room air; no wheezing. Psych: Fluid speech in conversation; appropriate affect; normal thought process  Ortho Exam - Left shoulder: There is no redness swelling or effusion.  There is limited mobility in all directions.  Positive drop arm.  There is pain with empty can testing.  External rotation is limited but full testing is difficult to assess given his lack of mobility and movement within his wheelchair.  Imaging:  *I did review to the left shoulder x-ray from below, agree with findings.  Also slightly superior migration of the humeral head indicative of rotator cuff arthropathy.  01/22/24: X-rays of the left shoulder show osteoarthritis of the [facility] and  glenohumeral  joint.  No acute or structural abnormalities.   Past Medical/Family/Surgical/Social History: Medications & Allergies reviewed per EMR, new medications updated. Patient Active Problem List   Diagnosis Date Noted   Recurrent cellulitis of lower extremity 11/21/2021   Overweight (BMI 25.0-29.9) 11/18/2021   Aortic atherosclerosis (HCC) 09/03/2021   Chronic diastolic CHF (congestive heart failure) (HCC) 06/25/2021   Benign prostatic hyperplasia with urinary obstruction 08/29/2020   Overactive bladder 08/29/2020   Impotence 08/29/2020   Urge incontinence of urine 08/29/2020   Chronic respiratory failure with hypoxia (HCC) 04/06/2019   Dyslipidemia 04/06/2019   MDD (major depressive disorder) 04/06/2019   Psychosis (HCC) 04/05/2019   Diastolic dysfunction 01/07/2017   Primary skin malignancy with unknown cell type 12/10/2016   Moderate protein-calorie malnutrition (HCC) 08/21/2016   Chronic left shoulder pain 08/21/2016   Tobacco use disorder 02/23/2016   Chronic venous stasis dermatitis of both lower extremities 02/09/2016   Spondylosis without myelopathy or radiculopathy, lumbar region 12/13/2015   Nonhealing ulcer of left lower extremity (HCC)    Degenerative arthritis of hip 04/25/2015   Microcytic anemia 03/27/2015   Hypertension    DM2 (diabetes mellitus, type 2) (HCC) 03/20/2015   Anemia, iron deficiency 03/20/2015  Past Medical History:  Diagnosis Date   Anemia    Arthritis    "left hip" (04/26/2015)   Cellulitis and abscess of leg hositalized 04/25/2015   left   Chronic diastolic CHF (congestive heart failure) (HCC) 06/25/2021   Chronic hip pain    Depression    GERD (gastroesophageal reflux disease)    Hypercholesterolemia    Hypertension    Pneumonia 1960's X 1   Primary skin malignancy with unknown cell type 12/10/2016   Sleep apnea    "couldn't wear the mask" (04/26/2015)   Thalassemia minor    Type II diabetes mellitus (HCC)    Family History   Problem Relation Age of Onset   Diabetes Mellitus II Mother    Diabetes Mellitus II Father    Diabetes Mellitus II Brother    Past Surgical History:  Procedure Laterality Date   INGUINAL HERNIA REPAIR Right ~ 2004   SHOULDER ARTHROSCOPY W/ ROTATOR CUFF REPAIR Right ~ 2004   TOENAIL EXCISION Right 1980's X 2   "big toe"   Social History   Occupational History   Not on file  Tobacco Use   Smoking status: Every Day    Current packs/day: 0.12    Average packs/day: 0.1 packs/day for 45.0 years (5.4 ttl pk-yrs)    Types: Cigarettes   Smokeless tobacco: Never  Vaping Use   Vaping status: Never Used  Substance and Sexual Activity   Alcohol  use: No    Comment: 04/26/2015 "I may drink 1/2 glass of wine or a couple beers 1-2 times/month; if that"   Drug use: No    Comment: "stopped all drug use in 2005; S/P SARP program in [place]"   Sexual activity: Never

## 2024-02-03 NOTE — Progress Notes (Signed)
 Patient says that he has had left shoulder pain for a few weeks with no known injury. He says that his pain feels deep in the shoulder; he denies pain, numbness, or tingling down the arm. He does have ROM although it is painful.

## 2024-03-03 ENCOUNTER — Encounter (HOSPITAL_BASED_OUTPATIENT_CLINIC_OR_DEPARTMENT_OTHER): Attending: Internal Medicine | Admitting: Internal Medicine

## 2024-03-03 DIAGNOSIS — E440 Moderate protein-calorie malnutrition: Secondary | ICD-10-CM | POA: Diagnosis not present

## 2024-03-03 DIAGNOSIS — L97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ed: Secondary | ICD-10-CM | POA: Insufficient documentation

## 2024-03-03 DIAGNOSIS — I5032 Chronic diastolic (congestive) heart failure: Secondary | ICD-10-CM | POA: Insufficient documentation

## 2024-03-03 DIAGNOSIS — L97912 Non-pressure chronic ulcer of unspecified part of right lower leg with fat layer exposed: Secondary | ICD-10-CM | POA: Diagnosis not present

## 2024-03-03 DIAGNOSIS — I87333 Chronic venous hypertension (idiopathic) with ulcer and inflammation of bilateral lower extremity: Secondary | ICD-10-CM | POA: Diagnosis present

## 2024-03-03 DIAGNOSIS — E11622 Type 2 diabetes mellitus with other skin ulcer: Secondary | ICD-10-CM | POA: Diagnosis not present

## 2024-03-03 DIAGNOSIS — I890 Lymphedema, not elsewhere classified: Secondary | ICD-10-CM | POA: Insufficient documentation

## 2024-03-24 DIAGNOSIS — J449 Chronic obstructive pulmonary disease, unspecified: Secondary | ICD-10-CM | POA: Diagnosis not present

## 2024-03-24 DIAGNOSIS — L409 Psoriasis, unspecified: Secondary | ICD-10-CM | POA: Diagnosis not present

## 2024-03-24 DIAGNOSIS — I10 Essential (primary) hypertension: Secondary | ICD-10-CM | POA: Diagnosis not present

## 2024-03-30 DIAGNOSIS — R2681 Unsteadiness on feet: Secondary | ICD-10-CM | POA: Diagnosis not present

## 2024-03-30 DIAGNOSIS — Z741 Need for assistance with personal care: Secondary | ICD-10-CM | POA: Diagnosis not present

## 2024-03-30 DIAGNOSIS — R293 Abnormal posture: Secondary | ICD-10-CM | POA: Diagnosis not present

## 2024-03-30 DIAGNOSIS — J449 Chronic obstructive pulmonary disease, unspecified: Secondary | ICD-10-CM | POA: Diagnosis not present

## 2024-04-12 DIAGNOSIS — F1421 Cocaine dependence, in remission: Secondary | ICD-10-CM | POA: Diagnosis not present

## 2024-04-12 DIAGNOSIS — F330 Major depressive disorder, recurrent, mild: Secondary | ICD-10-CM | POA: Diagnosis not present

## 2024-04-12 DIAGNOSIS — R2681 Unsteadiness on feet: Secondary | ICD-10-CM | POA: Diagnosis not present

## 2024-04-12 DIAGNOSIS — Z6829 Body mass index (BMI) 29.0-29.9, adult: Secondary | ICD-10-CM | POA: Diagnosis not present

## 2024-04-12 DIAGNOSIS — E785 Hyperlipidemia, unspecified: Secondary | ICD-10-CM | POA: Diagnosis not present

## 2024-04-12 DIAGNOSIS — N401 Enlarged prostate with lower urinary tract symptoms: Secondary | ICD-10-CM | POA: Diagnosis not present

## 2024-04-12 DIAGNOSIS — Z008 Encounter for other general examination: Secondary | ICD-10-CM | POA: Diagnosis not present

## 2024-04-12 DIAGNOSIS — E663 Overweight: Secondary | ICD-10-CM | POA: Diagnosis not present

## 2024-04-12 DIAGNOSIS — F17210 Nicotine dependence, cigarettes, uncomplicated: Secondary | ICD-10-CM | POA: Diagnosis not present

## 2024-04-14 ENCOUNTER — Encounter (HOSPITAL_BASED_OUTPATIENT_CLINIC_OR_DEPARTMENT_OTHER): Attending: Internal Medicine | Admitting: Internal Medicine

## 2024-04-14 DIAGNOSIS — L97912 Non-pressure chronic ulcer of unspecified part of right lower leg with fat layer exposed: Secondary | ICD-10-CM | POA: Diagnosis not present

## 2024-04-14 DIAGNOSIS — I890 Lymphedema, not elsewhere classified: Secondary | ICD-10-CM | POA: Insufficient documentation

## 2024-04-14 DIAGNOSIS — I87333 Chronic venous hypertension (idiopathic) with ulcer and inflammation of bilateral lower extremity: Secondary | ICD-10-CM | POA: Diagnosis not present

## 2024-04-14 DIAGNOSIS — I5032 Chronic diastolic (congestive) heart failure: Secondary | ICD-10-CM | POA: Diagnosis not present

## 2024-04-14 DIAGNOSIS — E440 Moderate protein-calorie malnutrition: Secondary | ICD-10-CM | POA: Diagnosis not present

## 2024-04-14 DIAGNOSIS — E11622 Type 2 diabetes mellitus with other skin ulcer: Secondary | ICD-10-CM | POA: Insufficient documentation

## 2024-04-14 DIAGNOSIS — L97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ed: Secondary | ICD-10-CM | POA: Diagnosis not present

## 2024-04-16 DIAGNOSIS — E118 Type 2 diabetes mellitus with unspecified complications: Secondary | ICD-10-CM | POA: Diagnosis not present

## 2024-04-16 DIAGNOSIS — E119 Type 2 diabetes mellitus without complications: Secondary | ICD-10-CM | POA: Diagnosis not present

## 2024-04-16 DIAGNOSIS — I10 Essential (primary) hypertension: Secondary | ICD-10-CM | POA: Diagnosis not present

## 2024-04-28 DIAGNOSIS — L259 Unspecified contact dermatitis, unspecified cause: Secondary | ICD-10-CM | POA: Diagnosis not present

## 2024-04-28 DIAGNOSIS — L299 Pruritus, unspecified: Secondary | ICD-10-CM | POA: Diagnosis not present

## 2024-04-28 DIAGNOSIS — E119 Type 2 diabetes mellitus without complications: Secondary | ICD-10-CM | POA: Diagnosis not present

## 2024-04-28 DIAGNOSIS — I509 Heart failure, unspecified: Secondary | ICD-10-CM | POA: Diagnosis not present

## 2024-04-30 DIAGNOSIS — I10 Essential (primary) hypertension: Secondary | ICD-10-CM | POA: Diagnosis not present

## 2024-04-30 DIAGNOSIS — I5040 Unspecified combined systolic (congestive) and diastolic (congestive) heart failure: Secondary | ICD-10-CM | POA: Diagnosis not present

## 2024-04-30 DIAGNOSIS — L03116 Cellulitis of left lower limb: Secondary | ICD-10-CM | POA: Diagnosis not present

## 2024-04-30 DIAGNOSIS — R5383 Other fatigue: Secondary | ICD-10-CM | POA: Diagnosis not present

## 2024-04-30 DIAGNOSIS — Z79899 Other long term (current) drug therapy: Secondary | ICD-10-CM | POA: Diagnosis not present

## 2024-04-30 DIAGNOSIS — E440 Moderate protein-calorie malnutrition: Secondary | ICD-10-CM | POA: Diagnosis not present

## 2024-04-30 DIAGNOSIS — N390 Urinary tract infection, site not specified: Secondary | ICD-10-CM | POA: Diagnosis not present

## 2024-04-30 DIAGNOSIS — I872 Venous insufficiency (chronic) (peripheral): Secondary | ICD-10-CM | POA: Diagnosis not present

## 2024-04-30 DIAGNOSIS — E119 Type 2 diabetes mellitus without complications: Secondary | ICD-10-CM | POA: Diagnosis not present

## 2024-05-11 ENCOUNTER — Other Ambulatory Visit (HOSPITAL_COMMUNITY): Payer: Self-pay

## 2024-05-11 DIAGNOSIS — I5042 Chronic combined systolic (congestive) and diastolic (congestive) heart failure: Secondary | ICD-10-CM | POA: Diagnosis not present

## 2024-05-11 DIAGNOSIS — E1159 Type 2 diabetes mellitus with other circulatory complications: Secondary | ICD-10-CM | POA: Diagnosis not present

## 2024-05-11 DIAGNOSIS — D649 Anemia, unspecified: Secondary | ICD-10-CM | POA: Diagnosis not present

## 2024-05-25 ENCOUNTER — Encounter (HOSPITAL_BASED_OUTPATIENT_CLINIC_OR_DEPARTMENT_OTHER): Attending: Internal Medicine | Admitting: Internal Medicine

## 2024-05-26 DIAGNOSIS — F32A Depression, unspecified: Secondary | ICD-10-CM | POA: Diagnosis not present

## 2024-05-26 DIAGNOSIS — I10 Essential (primary) hypertension: Secondary | ICD-10-CM | POA: Diagnosis not present

## 2024-05-26 DIAGNOSIS — G4700 Insomnia, unspecified: Secondary | ICD-10-CM | POA: Diagnosis not present

## 2024-05-26 DIAGNOSIS — I509 Heart failure, unspecified: Secondary | ICD-10-CM | POA: Diagnosis not present

## 2024-06-11 DIAGNOSIS — K649 Unspecified hemorrhoids: Secondary | ICD-10-CM | POA: Diagnosis not present

## 2024-06-30 ENCOUNTER — Ambulatory Visit (HOSPITAL_BASED_OUTPATIENT_CLINIC_OR_DEPARTMENT_OTHER): Admitting: Internal Medicine

## 2024-07-01 DIAGNOSIS — D649 Anemia, unspecified: Secondary | ICD-10-CM | POA: Diagnosis not present

## 2024-07-01 DIAGNOSIS — E1165 Type 2 diabetes mellitus with hyperglycemia: Secondary | ICD-10-CM | POA: Diagnosis not present

## 2024-07-01 DIAGNOSIS — J302 Other seasonal allergic rhinitis: Secondary | ICD-10-CM | POA: Diagnosis not present

## 2024-07-01 DIAGNOSIS — N401 Enlarged prostate with lower urinary tract symptoms: Secondary | ICD-10-CM | POA: Diagnosis not present

## 2024-07-07 ENCOUNTER — Encounter (HOSPITAL_BASED_OUTPATIENT_CLINIC_OR_DEPARTMENT_OTHER): Attending: Internal Medicine | Admitting: Internal Medicine

## 2024-07-07 DIAGNOSIS — E440 Moderate protein-calorie malnutrition: Secondary | ICD-10-CM | POA: Insufficient documentation

## 2024-07-07 DIAGNOSIS — L97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ed: Secondary | ICD-10-CM | POA: Diagnosis not present

## 2024-07-07 DIAGNOSIS — I5032 Chronic diastolic (congestive) heart failure: Secondary | ICD-10-CM | POA: Diagnosis not present

## 2024-07-07 DIAGNOSIS — I890 Lymphedema, not elsewhere classified: Secondary | ICD-10-CM | POA: Diagnosis not present

## 2024-07-07 DIAGNOSIS — E11622 Type 2 diabetes mellitus with other skin ulcer: Secondary | ICD-10-CM | POA: Insufficient documentation

## 2024-07-07 DIAGNOSIS — I87333 Chronic venous hypertension (idiopathic) with ulcer and inflammation of bilateral lower extremity: Secondary | ICD-10-CM | POA: Diagnosis present

## 2024-07-07 DIAGNOSIS — L97912 Non-pressure chronic ulcer of unspecified part of right lower leg with fat layer exposed: Secondary | ICD-10-CM | POA: Insufficient documentation

## 2024-07-08 DIAGNOSIS — R0989 Other specified symptoms and signs involving the circulatory and respiratory systems: Secondary | ICD-10-CM | POA: Diagnosis not present

## 2024-07-26 ENCOUNTER — Encounter: Payer: Self-pay | Admitting: Radiology

## 2024-08-17 ENCOUNTER — Encounter (HOSPITAL_BASED_OUTPATIENT_CLINIC_OR_DEPARTMENT_OTHER): Attending: Internal Medicine | Admitting: Internal Medicine

## 2024-08-17 DIAGNOSIS — E440 Moderate protein-calorie malnutrition: Secondary | ICD-10-CM | POA: Insufficient documentation

## 2024-08-17 DIAGNOSIS — I890 Lymphedema, not elsewhere classified: Secondary | ICD-10-CM | POA: Diagnosis not present

## 2024-08-17 DIAGNOSIS — L97912 Non-pressure chronic ulcer of unspecified part of right lower leg with fat layer exposed: Secondary | ICD-10-CM | POA: Insufficient documentation

## 2024-08-17 DIAGNOSIS — E11622 Type 2 diabetes mellitus with other skin ulcer: Secondary | ICD-10-CM | POA: Diagnosis not present

## 2024-08-17 DIAGNOSIS — L97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ed: Secondary | ICD-10-CM | POA: Insufficient documentation

## 2024-08-17 DIAGNOSIS — I5032 Chronic diastolic (congestive) heart failure: Secondary | ICD-10-CM | POA: Diagnosis not present

## 2024-08-17 DIAGNOSIS — I87333 Chronic venous hypertension (idiopathic) with ulcer and inflammation of bilateral lower extremity: Secondary | ICD-10-CM | POA: Diagnosis present

## 2024-09-28 ENCOUNTER — Encounter (HOSPITAL_BASED_OUTPATIENT_CLINIC_OR_DEPARTMENT_OTHER): Attending: Internal Medicine | Admitting: Internal Medicine

## 2024-09-28 DIAGNOSIS — L97912 Non-pressure chronic ulcer of unspecified part of right lower leg with fat layer exposed: Secondary | ICD-10-CM | POA: Diagnosis not present

## 2024-09-28 DIAGNOSIS — I890 Lymphedema, not elsewhere classified: Secondary | ICD-10-CM | POA: Insufficient documentation

## 2024-09-28 DIAGNOSIS — I87333 Chronic venous hypertension (idiopathic) with ulcer and inflammation of bilateral lower extremity: Secondary | ICD-10-CM | POA: Insufficient documentation

## 2024-09-28 DIAGNOSIS — L97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ed: Secondary | ICD-10-CM | POA: Insufficient documentation

## 2024-09-28 DIAGNOSIS — I5032 Chronic diastolic (congestive) heart failure: Secondary | ICD-10-CM | POA: Diagnosis not present

## 2024-09-28 DIAGNOSIS — E11622 Type 2 diabetes mellitus with other skin ulcer: Secondary | ICD-10-CM | POA: Diagnosis present

## 2024-09-28 DIAGNOSIS — E440 Moderate protein-calorie malnutrition: Secondary | ICD-10-CM | POA: Insufficient documentation

## 2024-11-09 ENCOUNTER — Encounter (HOSPITAL_BASED_OUTPATIENT_CLINIC_OR_DEPARTMENT_OTHER): Admitting: Internal Medicine
